# Patient Record
Sex: Female | Born: 1960 | Race: White | Hispanic: No | Smoking: Former smoker
Health system: Southern US, Community
[De-identification: ages and names within clinical notes are randomized; demographics above are authoritative.]

## PROBLEM LIST (undated history)

## (undated) DIAGNOSIS — H409 Unspecified glaucoma: Secondary | ICD-10-CM

## (undated) DIAGNOSIS — M1990 Unspecified osteoarthritis, unspecified site: Secondary | ICD-10-CM

## (undated) DIAGNOSIS — I739 Peripheral vascular disease, unspecified: Secondary | ICD-10-CM

## (undated) DIAGNOSIS — M25511 Pain in right shoulder: Secondary | ICD-10-CM

## (undated) DIAGNOSIS — I10 Essential (primary) hypertension: Secondary | ICD-10-CM

## (undated) DIAGNOSIS — J45909 Unspecified asthma, uncomplicated: Secondary | ICD-10-CM

## (undated) DIAGNOSIS — F419 Anxiety disorder, unspecified: Secondary | ICD-10-CM

## (undated) DIAGNOSIS — E119 Type 2 diabetes mellitus without complications: Secondary | ICD-10-CM

## (undated) DIAGNOSIS — K219 Gastro-esophageal reflux disease without esophagitis: Secondary | ICD-10-CM

## (undated) DIAGNOSIS — N10 Acute tubulo-interstitial nephritis: Secondary | ICD-10-CM

## (undated) DIAGNOSIS — E785 Hyperlipidemia, unspecified: Secondary | ICD-10-CM

## (undated) HISTORY — DX: Acute pyelonephritis: N10

## (undated) HISTORY — DX: Unspecified glaucoma: H40.9

## (undated) HISTORY — DX: Gastro-esophageal reflux disease without esophagitis: K21.9

## (undated) HISTORY — DX: Pain in right shoulder: M25.511

## (undated) HISTORY — DX: Anxiety disorder, unspecified: F41.9

## (undated) HISTORY — PX: ANTERIOR CRUCIATE LIGAMENT REPAIR: SHX115

---

## 2008-11-03 ENCOUNTER — Ambulatory Visit: Payer: Self-pay | Admitting: Anesthesiology

## 2008-12-24 ENCOUNTER — Emergency Department: Payer: Self-pay | Admitting: Emergency Medicine

## 2016-04-23 ENCOUNTER — Emergency Department: Payer: BLUE CROSS/BLUE SHIELD

## 2016-04-23 ENCOUNTER — Encounter: Payer: Self-pay | Admitting: Emergency Medicine

## 2016-04-23 ENCOUNTER — Inpatient Hospital Stay
Admission: EM | Admit: 2016-04-23 | Discharge: 2016-05-02 | DRG: 871 | Disposition: A | Discharge status: Skilled Nursing Facility | Payer: BLUE CROSS/BLUE SHIELD | Attending: Internal Medicine | Admitting: Internal Medicine

## 2016-04-23 DIAGNOSIS — E875 Hyperkalemia: Secondary | ICD-10-CM | POA: Diagnosis present

## 2016-04-23 DIAGNOSIS — N189 Chronic kidney disease, unspecified: Secondary | ICD-10-CM | POA: Diagnosis present

## 2016-04-23 DIAGNOSIS — A4159 Other Gram-negative sepsis: Secondary | ICD-10-CM | POA: Diagnosis not present

## 2016-04-23 DIAGNOSIS — A419 Sepsis, unspecified organism: Secondary | ICD-10-CM

## 2016-04-23 DIAGNOSIS — E860 Dehydration: Secondary | ICD-10-CM | POA: Diagnosis present

## 2016-04-23 DIAGNOSIS — Z7984 Long term (current) use of oral hypoglycemic drugs: Secondary | ICD-10-CM | POA: Diagnosis not present

## 2016-04-23 DIAGNOSIS — N170 Acute kidney failure with tubular necrosis: Secondary | ICD-10-CM | POA: Diagnosis present

## 2016-04-23 DIAGNOSIS — B961 Klebsiella pneumoniae [K. pneumoniae] as the cause of diseases classified elsewhere: Secondary | ICD-10-CM | POA: Diagnosis present

## 2016-04-23 DIAGNOSIS — M6282 Rhabdomyolysis: Secondary | ICD-10-CM | POA: Diagnosis present

## 2016-04-23 DIAGNOSIS — N390 Urinary tract infection, site not specified: Secondary | ICD-10-CM

## 2016-04-23 DIAGNOSIS — E871 Hypo-osmolality and hyponatremia: Secondary | ICD-10-CM | POA: Diagnosis present

## 2016-04-23 DIAGNOSIS — I129 Hypertensive chronic kidney disease with stage 1 through stage 4 chronic kidney disease, or unspecified chronic kidney disease: Secondary | ICD-10-CM | POA: Diagnosis present

## 2016-04-23 DIAGNOSIS — N10 Acute tubulo-interstitial nephritis: Secondary | ICD-10-CM | POA: Diagnosis present

## 2016-04-23 DIAGNOSIS — R29898 Other symptoms and signs involving the musculoskeletal system: Secondary | ICD-10-CM

## 2016-04-23 DIAGNOSIS — E1310 Other specified diabetes mellitus with ketoacidosis without coma: Secondary | ICD-10-CM | POA: Diagnosis present

## 2016-04-23 DIAGNOSIS — N2889 Other specified disorders of kidney and ureter: Secondary | ICD-10-CM | POA: Diagnosis present

## 2016-04-23 DIAGNOSIS — R600 Localized edema: Secondary | ICD-10-CM | POA: Diagnosis present

## 2016-04-23 DIAGNOSIS — N179 Acute kidney failure, unspecified: Secondary | ICD-10-CM

## 2016-04-23 DIAGNOSIS — T796XXD Traumatic ischemia of muscle, subsequent encounter: Secondary | ICD-10-CM | POA: Diagnosis not present

## 2016-04-23 DIAGNOSIS — L8990 Pressure ulcer of unspecified site, unspecified stage: Secondary | ICD-10-CM | POA: Insufficient documentation

## 2016-04-23 DIAGNOSIS — R609 Edema, unspecified: Secondary | ICD-10-CM

## 2016-04-23 DIAGNOSIS — L03116 Cellulitis of left lower limb: Secondary | ICD-10-CM | POA: Diagnosis present

## 2016-04-23 DIAGNOSIS — Z883 Allergy status to other anti-infective agents status: Secondary | ICD-10-CM | POA: Diagnosis not present

## 2016-04-23 DIAGNOSIS — E876 Hypokalemia: Secondary | ICD-10-CM | POA: Diagnosis not present

## 2016-04-23 DIAGNOSIS — R6521 Severe sepsis with septic shock: Secondary | ICD-10-CM | POA: Diagnosis present

## 2016-04-23 DIAGNOSIS — E119 Type 2 diabetes mellitus without complications: Secondary | ICD-10-CM | POA: Insufficient documentation

## 2016-04-23 DIAGNOSIS — G8220 Paraplegia, unspecified: Secondary | ICD-10-CM | POA: Diagnosis present

## 2016-04-23 DIAGNOSIS — N281 Cyst of kidney, acquired: Secondary | ICD-10-CM

## 2016-04-23 DIAGNOSIS — F17290 Nicotine dependence, other tobacco product, uncomplicated: Secondary | ICD-10-CM | POA: Diagnosis present

## 2016-04-23 DIAGNOSIS — E1110 Type 2 diabetes mellitus with ketoacidosis without coma: Secondary | ICD-10-CM | POA: Diagnosis present

## 2016-04-23 DIAGNOSIS — B370 Candidal stomatitis: Secondary | ICD-10-CM | POA: Diagnosis not present

## 2016-04-23 DIAGNOSIS — I471 Supraventricular tachycardia: Secondary | ICD-10-CM | POA: Diagnosis not present

## 2016-04-23 DIAGNOSIS — R509 Fever, unspecified: Secondary | ICD-10-CM

## 2016-04-23 DIAGNOSIS — R0602 Shortness of breath: Secondary | ICD-10-CM

## 2016-04-23 DIAGNOSIS — T796XXS Traumatic ischemia of muscle, sequela: Secondary | ICD-10-CM | POA: Diagnosis not present

## 2016-04-23 HISTORY — DX: Type 2 diabetes mellitus without complications: E11.9

## 2016-04-23 HISTORY — DX: Unspecified osteoarthritis, unspecified site: M19.90

## 2016-04-23 HISTORY — DX: Hyperlipidemia, unspecified: E78.5

## 2016-04-23 HISTORY — DX: Unspecified asthma, uncomplicated: J45.909

## 2016-04-23 HISTORY — DX: Essential (primary) hypertension: I10

## 2016-04-23 LAB — CBC WITH DIFFERENTIAL/PLATELET
BAND NEUTROPHILS: 13 %
BASOS PCT: 0 %
Basophils Absolute: 0 10*3/uL (ref 0–0.1)
Blasts: 0 %
EOS ABS: 0 10*3/uL (ref 0–0.7)
Eosinophils Relative: 0 %
HEMATOCRIT: 32.7 % — AB (ref 35.0–47.0)
Hemoglobin: 10.6 g/dL — ABNORMAL LOW (ref 12.0–16.0)
LYMPHS PCT: 5 %
Lymphs Abs: 0.5 10*3/uL — ABNORMAL LOW (ref 1.0–3.6)
MCH: 27.9 pg (ref 26.0–34.0)
MCHC: 32.4 g/dL (ref 32.0–36.0)
MCV: 86.1 fL (ref 80.0–100.0)
MONO ABS: 0.3 10*3/uL (ref 0.2–0.9)
MONOS PCT: 3 %
Metamyelocytes Relative: 1 %
Myelocytes: 1 %
NEUTROS ABS: 8.8 10*3/uL — AB (ref 1.4–6.5)
NEUTROS PCT: 77 %
NRBC: 0 /100{WBCs}
OTHER: 0 %
Platelets: 203 10*3/uL (ref 150–440)
Promyelocytes Absolute: 0 %
RBC: 3.8 MIL/uL (ref 3.80–5.20)
RDW: 16 % — AB (ref 11.5–14.5)
WBC: 9.6 10*3/uL (ref 3.6–11.0)

## 2016-04-23 LAB — URINALYSIS COMPLETE WITH MICROSCOPIC (ARMC ONLY)
Bilirubin Urine: NEGATIVE
Glucose, UA: >500 mg/dL — AB
Nitrite: NEGATIVE
PROTEIN: 30 mg/dL — AB
Specific Gravity, Urine: 1.016 (ref 1.005–1.030)
pH: 5 (ref 5.0–8.0)

## 2016-04-23 LAB — BASIC METABOLIC PANEL
ANION GAP: 11 (ref 5–15)
BUN: 77 mg/dL — ABNORMAL HIGH (ref 6–20)
CALCIUM: 6.8 mg/dL — AB (ref 8.9–10.3)
CO2: 14 mmol/L — AB (ref 22–32)
Chloride: 100 mmol/L — ABNORMAL LOW (ref 101–111)
Creatinine, Ser: 2.36 mg/dL — ABNORMAL HIGH (ref 0.44–1.00)
GFR, EST AFRICAN AMERICAN: 26 mL/min — AB (ref >60)
GFR, EST NON AFRICAN AMERICAN: 22 mL/min — AB (ref >60)
Glucose, Bld: 486 mg/dL — ABNORMAL HIGH (ref 65–99)
Potassium: 3.6 mmol/L (ref 3.5–5.1)
SODIUM: 125 mmol/L — AB (ref 135–145)

## 2016-04-23 LAB — COMPREHENSIVE METABOLIC PANEL
ALBUMIN: 2.7 g/dL — AB (ref 3.5–5.0)
ALT: 45 U/L (ref 14–54)
ANION GAP: 17 — AB (ref 5–15)
AST: 90 U/L — ABNORMAL HIGH (ref 15–41)
Alkaline Phosphatase: 61 U/L (ref 38–126)
BUN: 99 mg/dL — ABNORMAL HIGH (ref 6–20)
CHLORIDE: 82 mmol/L — AB (ref 101–111)
CO2: 18 mmol/L — AB (ref 22–32)
Calcium: 8.5 mg/dL — ABNORMAL LOW (ref 8.9–10.3)
Creatinine, Ser: 3.1 mg/dL — ABNORMAL HIGH (ref 0.44–1.00)
GFR calc Af Amer: 19 mL/min — ABNORMAL LOW (ref >60)
GFR calc non Af Amer: 16 mL/min — ABNORMAL LOW (ref >60)
GLUCOSE: 788 mg/dL — AB (ref 65–99)
POTASSIUM: 5.2 mmol/L — AB (ref 3.5–5.1)
SODIUM: 117 mmol/L — AB (ref 135–145)
Total Bilirubin: 0.9 mg/dL (ref 0.3–1.2)
Total Protein: 7.4 g/dL (ref 6.5–8.1)

## 2016-04-23 LAB — CBC
HCT: 25.3 % — ABNORMAL LOW (ref 35.0–47.0)
HEMOGLOBIN: 8.4 g/dL — AB (ref 12.0–16.0)
MCH: 27.8 pg (ref 26.0–34.0)
MCHC: 33.1 g/dL (ref 32.0–36.0)
MCV: 84 fL (ref 80.0–100.0)
Platelets: 152 10*3/uL (ref 150–440)
RBC: 3.01 MIL/uL — AB (ref 3.80–5.20)
RDW: 15.9 % — ABNORMAL HIGH (ref 11.5–14.5)
WBC: 6.5 10*3/uL (ref 3.6–11.0)

## 2016-04-23 LAB — LACTIC ACID, PLASMA
LACTIC ACID, VENOUS: 2.8 mmol/L — AB (ref 0.5–2.0)
Lactic Acid, Venous: 2.2 mmol/L (ref 0.5–2.0)

## 2016-04-23 LAB — GLUCOSE, CAPILLARY
GLUCOSE-CAPILLARY: 432 mg/dL — AB (ref 65–99)
GLUCOSE-CAPILLARY: 328 mg/dL — AB (ref 65–99)
GLUCOSE-CAPILLARY: 399 mg/dL — AB (ref 65–99)
Glucose-Capillary: 532 mg/dL — ABNORMAL HIGH (ref 65–99)
Glucose-Capillary: 567 mg/dL (ref 65–99)

## 2016-04-23 LAB — MRSA PCR SCREENING: MRSA BY PCR: POSITIVE — AB

## 2016-04-23 LAB — CK
CK TOTAL: 5665 U/L — AB (ref 38–234)
CK TOTAL: 3670 U/L — AB (ref 38–234)

## 2016-04-23 LAB — TROPONIN I: TROPONIN I: 0.03 ng/mL (ref <0.031)

## 2016-04-23 MED ORDER — ACETAMINOPHEN 650 MG RE SUPP: 650.0000 mg | Freq: Four times a day (QID) | RECTAL | Status: DC | PRN

## 2016-04-23 MED ORDER — POTASSIUM CHLORIDE 10 MEQ/100ML IV SOLN: 10.0000 meq | INTRAVENOUS | Status: DC

## 2016-04-23 MED ORDER — TRAZODONE HCL 50 MG PO TABS
25.0000 mg | ORAL_TABLET | Freq: Every evening | ORAL | Status: DC | PRN
  Administered 2016-04-26 – 2016-05-01 (×4): 25 mg via ORAL
  Filled 2016-04-23 (×4): qty 1

## 2016-04-23 MED ORDER — ACETAMINOPHEN 325 MG PO TABS
650.0000 mg | ORAL_TABLET | Freq: Four times a day (QID) | ORAL | Status: DC | PRN
  Administered 2016-04-24 – 2016-05-01 (×6): 650 mg via ORAL
  Filled 2016-04-23 (×6): qty 2

## 2016-04-23 MED ORDER — DEXTROSE 5 % IV SOLN
1.0000 g | INTRAVENOUS | Status: DC
  Administered 2016-04-23: 1 g via INTRAVENOUS
  Filled 2016-04-23: qty 10

## 2016-04-23 MED ORDER — VANCOMYCIN HCL IN DEXTROSE 1-5 GM/200ML-% IV SOLN
1000.0000 mg | Freq: Once | INTRAVENOUS | Status: AC
  Administered 2016-04-23: 1000 mg via INTRAVENOUS
  Filled 2016-04-23: qty 200

## 2016-04-23 MED ORDER — SODIUM CHLORIDE 0.9 % IV BOLUS (SEPSIS)
1000.0000 mL | Freq: Once | INTRAVENOUS | Status: AC
  Administered 2016-04-23: 1000 mL via INTRAVENOUS

## 2016-04-23 MED ORDER — HYDROCODONE-ACETAMINOPHEN 5-325 MG PO TABS
1.0000 | ORAL_TABLET | ORAL | Status: DC | PRN
  Administered 2016-04-26 – 2016-04-27 (×2): 1 via ORAL
  Administered 2016-04-28 – 2016-05-01 (×9): 2 via ORAL
  Administered 2016-05-01: 21:00:00 1 via ORAL
  Administered 2016-05-01 – 2016-05-02 (×3): 2 via ORAL
  Filled 2016-04-23 (×5): qty 2
  Filled 2016-04-23: qty 1
  Filled 2016-04-23 (×9): qty 2

## 2016-04-23 MED ORDER — DOCUSATE SODIUM 100 MG PO CAPS
100.0000 mg | ORAL_CAPSULE | Freq: Two times a day (BID) | ORAL | Status: DC
  Administered 2016-04-23 – 2016-05-02 (×19): 100 mg via ORAL
  Filled 2016-04-23 (×18): qty 1

## 2016-04-23 MED ORDER — SODIUM CHLORIDE 0.9 % IV SOLN
INTRAVENOUS | Status: DC
  Administered 2016-04-23: 5.4 [IU]/h via INTRAVENOUS
  Administered 2016-04-24: 4.4 [IU]/h via INTRAVENOUS
  Administered 2016-04-24: 1.7 [IU]/h via INTRAVENOUS
  Filled 2016-04-23: qty 2.5

## 2016-04-23 MED ORDER — ONDANSETRON HCL 4 MG PO TABS: 4.0000 mg | ORAL_TABLET | Freq: Four times a day (QID) | ORAL | Status: DC | PRN

## 2016-04-23 MED ORDER — SODIUM CHLORIDE 0.9 % IV SOLN: INTRAVENOUS | Status: DC

## 2016-04-23 MED ORDER — SODIUM CHLORIDE 0.9 % IV BOLUS (SEPSIS): 1000.0000 mL | Freq: Once | INTRAVENOUS | Status: DC

## 2016-04-23 MED ORDER — ONDANSETRON HCL 4 MG/2ML IJ SOLN: 4.0000 mg | Freq: Four times a day (QID) | INTRAMUSCULAR | Status: DC | PRN

## 2016-04-23 MED ORDER — PIPERACILLIN-TAZOBACTAM 3.375 G IVPB
3.3750 g | Freq: Once | INTRAVENOUS | Status: AC
  Administered 2016-04-23: 3.375 g via INTRAVENOUS
  Filled 2016-04-23: qty 50

## 2016-04-23 MED ORDER — SODIUM CHLORIDE 0.9 % IV SOLN
INTRAVENOUS | Status: AC
Start: 2016-04-23 — End: 2016-04-23
  Administered 2016-04-23: 20:00:00 via INTRAVENOUS

## 2016-04-23 MED ORDER — DEXTROSE-NACL 5-0.45 % IV SOLN
INTRAVENOUS | Status: DC
  Administered 2016-04-24: 03:00:00 via INTRAVENOUS

## 2016-04-23 MED ORDER — HEPARIN SODIUM (PORCINE) 5000 UNIT/ML IJ SOLN
5000.0000 [IU] | Freq: Three times a day (TID) | INTRAMUSCULAR | Status: DC
  Administered 2016-04-23 – 2016-04-25 (×5): 5000 [IU] via SUBCUTANEOUS
  Filled 2016-04-23 (×5): qty 1

## 2016-04-23 MED ORDER — BISACODYL 5 MG PO TBEC
5.0000 mg | DELAYED_RELEASE_TABLET | Freq: Every day | ORAL | Status: DC | PRN
  Administered 2016-05-01: 5 mg via ORAL
  Filled 2016-04-23: qty 1

## 2016-04-23 MED ORDER — HYDROCOD POLST-CPM POLST ER 10-8 MG/5ML PO SUER
5.0000 mL | Freq: Two times a day (BID) | ORAL | Status: DC | PRN
  Administered 2016-04-24 (×2): 5 mL via ORAL
  Filled 2016-04-23 (×2): qty 5

## 2016-04-23 MED ORDER — ACETAMINOPHEN 500 MG PO TABS
1000.0000 mg | ORAL_TABLET | Freq: Once | ORAL | Status: AC
  Administered 2016-04-23: 1000 mg via ORAL
  Filled 2016-04-23: qty 2

## 2016-04-23 NOTE — H&P (Signed)
[facility] - [place] [facility]   PATIENT NAME: [name]    MR#:  [id number]  DATE OF BIRTH:  1961-12-15  DATE OF ADMISSION:  04/23/2016  PRIMARY CARE PHYSICIAN: Dortha Kern, MD   REQUESTING/REFERRING PHYSICIAN: Dr. Marshall Cork  CHIEF COMPLAINT: Generalized weakness    Chief Complaint  Patient presents with  . Fall  . Hyperglycemia    HISTORY OF PRESENT ILLNESS:  [name]  is a 55 y.o. female with a known history of diabetes mellitus type 2, hypertension, hyperlipidemia brought in by family for confusion, generalized weakness. The patient was sitting on the toilet and could not get off the toilet due to weakness. No loss of consciousness or no fall. Patient had temperature of 102 Fahrenheit last Thursday associated with nausea, vomiting since then. Patient had no diarrhea. But has decreased urination since Thursday. Also noted to have generalized weakness since this morning. Stopped taking her diabetic medications namely Victoza, metformin, glipizide since Thursday of last week. She also had poor by mouth intake for 4 days. Noted to have DKA with elevated blood sugar than 600, anion gap 17, hyponatremia with sodium 117, lactic acidosis with lactic acid 2.2, rhabdomyolysis with CK elevated up to 5665. And also has some cough but no shortness of breath.  PAST MEDICAL HISTORY:   Past Medical History  Diagnosis Date  . Diabetes mellitus without complication (HCC)   . Hypertension   . Arthritis   . Asthma   . Hyperlipemia     PAST SURGICAL HISTOIRY:   Past Surgical History  Procedure Laterality Date  . Anterior cruciate ligament repair      SOCIAL HISTORY:   Social History  Substance Use Topics  . Smoking status: Current Every Day Smoker  . Smokeless tobacco: Not on file  . Alcohol Use: No    FAMILY HISTORY:  No family history on file.  DRUG ALLERGIES:  No Known Allergies  REVIEW OF SYSTEMS:  CONSTITUTIONAL: Fever, generalized  weakness EYES: No blurred or double vision.  EARS, NOSE, AND THROAT: No tinnitus or ear pain.  RESPIRATORY: Has some cough but no shortness of breath.  CARDIOVASCULAR: No chest pain, orthopnea, edema.  GASTROINTESTINAL: Nausea, vomiting, poor urine output since 4 days.  GENITOURINARY: No dysuria, hematuria.  ENDOCRINE: No polyuria, nocturia,  HEMATOLOGY: No anemia, easy bruising or bleeding SKIN: No rash or lesion. MUSCULOSKELETAL: No joint pain or arthritis.   NEUROLOGIC: No tingling, numbness, weakness.  PSYCHIATRY: No anxiety or depression.   MEDICATIONS AT HOME:   Prior to Admission medications   Not on File      VITAL SIGNS:  Blood pressure 112/67, pulse 126, temperature 100.6 F (38.1 C), resp. rate 29, height 5\' 3"  (1.6 m), weight 114.7 kg (252 lb 13.9 oz), SpO2 99 %.  PHYSICAL EXAMINATION:  GENERAL:  55 y.o.-year-old patient lying in the bed with no acute distress.  EYES: Pupils equal, round, reactive to light and accommodation. No scleral icterus. Extraocular muscles intact.  HEENT: Head atraumatic, normocephalic. Oropharynx and nasopharynx clear.  NECK:  Supple, no jugular venous distention. No thyroid enlargement, no tenderness.  LUNGS: Normal breath sounds bilaterally, no wheezing, rales,rhonchi or crepitation. No use of accessory muscles of respiration.  CARDIOVASCULAR: S1, S2 normal. No murmurs, rubs, or gallops.  ABDOMEN: Soft, nontender, nondistended. Bowel sounds present. No organomegaly or mass.  EXTREMITIES: No pedal edema, cyanosis, or clubbing.  NEUROLOGIC: Cranial nerves II through XII are intact. Muscle strength 5/5 in all extremities. Sensation  intact. Gait not checked.  PSYCHIATRIC: The patient is alert and oriented x 3.  SKIN: No obvious rash, lesion, or ulcer.   LABORATORY PANEL:   CBC  Recent Labs Lab 04/23/16 1641  WBC 9.6  HGB 10.6*  HCT 32.7*  PLT 203    ------------------------------------------------------------------------------------------------------------------  Chemistries   Recent Labs Lab 04/23/16 1641  NA 117*  K 5.2*  CL 82*  CO2 18*  GLUCOSE 788*  BUN 99*  CREATININE 3.10*  CALCIUM 8.5*  AST 90*  ALT 45  ALKPHOS 61  BILITOT 0.9   ------------------------------------------------------------------------------------------------------------------  Cardiac Enzymes  Recent Labs Lab 04/23/16 1641  TROPONINI 0.03   ------------------------------------------------------------------------------------------------------------------  RADIOLOGY:  Dg Chest Portable 1 View  04/23/2016  CLINICAL DATA:  Post fall yesterday. History of diabetes, hypertension, asthma. EXAM: PORTABLE CHEST 1 VIEW COMPARISON:  None. FINDINGS: Heart size is upper normal. Lungs are clear. Lung volumes are normal. No pleural effusion or pneumothorax seen. No osseous fracture or dislocation identified. IMPRESSION: No acute findings. Electronically Signed   By: Bary Richard M.D.   On: 04/23/2016 17:21    EKG:   Orders placed or performed during the hospital encounter of 04/23/16  . ED EKG  . ED EKG   sinus tachycardia with rate upto 126 bpm. IMPRESSION AND PLAN:   1. #1 diabetic ketoacidosis with anion gap of 17, pH 7.5 blood sugar more than 700 with acute renal failure and hyponatremia: Admit to intensive care unit, started on insulin drip, aggressive IV hydration. The patient nothing by mouth ~anion gap is closed. #2 septic shock secondary to UTI: Evidence of lactic acidosis with 2.2 lactic acid, hypotension blood pressure is 90/70, tachycardia with heart rate 1 30 bpm, acute renal failure with creatinine 3.10. Started on IV Rocephin, aggressive IV hydration, follow blood cultures, urine cultures. #3. rhabdomyolysis with elevated CK 5665: Continue IV aggressive hydration, Avoid nephrotoxic agents with hct Z, lisinopril, daily CK to be  followed. 4.h/o tobacco abuse;still smokes 10 cigars a day,does not want to quit,counselled against smoking for 15   Min.   All the records are reviewed and case discussed with ED provider. Management plans discussed with the patient, family and they are in agreement.  CODE STATUS: full  TOTAL TIME TAKING CARE OF THIS PATIENT: .  Critical care   Merilynn Haydu M.D on 04/23/2016 at 6:38 PM  Between 7am to 6pm - Pager - [phone]  After 6pm go to [email or web address] - password [email or web address] [facility]  [facility] [place] [facility]  Office  [phone]  CC: Primary care physician; Dortha Kern, MD  Note: This dictation was prepared with Dragon dictation along with smaller phrase technology. Any transcriptional errors that result from this process are unintentional.

## 2016-04-23 NOTE — ED Provider Notes (Signed)
[facility] Emergency Department Provider Note  ____________________________________________  Time seen: Approximately 4:34 PM  I have reviewed the triage vital signs and the nursing notes.   HISTORY  Chief Complaint Fall and Hyperglycemia  Caveat-history of physical and review of systems Limited due to the patient's confusion.  HPI [name] is a 55 y.o. female should've diabetes, hypertension, hyperlipidemia who presents via EMS for fall and generalized weakness today. Patient is confused as to what exactly happened today however she reports that she was sitting on the toilet and she could not get off of the toilet because she felt so weak. She fell. She did not hit her head or lose consciousness. She is not complaining of any pain. She did not hit her head or lose consciousness. She thinks she she may have been on the floor the bathroom "for a few days" but then  She also thinks that maybe this happened today.   Past Medical History  Diagnosis Date  . Diabetes mellitus without complication (HCC)   . Hypertension   . Arthritis   . Asthma   . Hyperlipemia     There are no active problems to display for this patient.   Past Surgical History  Procedure Laterality Date  . Anterior cruciate ligament repair      No current outpatient prescriptions on file.  Allergies Review of patient's allergies indicates no known allergies.  No family history on file.  Social History Social History  Substance Use Topics  . Smoking status: Current Every Day Smoker  . Smokeless tobacco: None  . Alcohol Use: No    Review of Systems   Caveat-history of physical and review of systems Limited due to the patient's confusion. ____________________________________________   PHYSICAL EXAM:  Filed Vitals:   04/23/16 1700 04/23/16 1715 04/23/16 1730 04/23/16 1817  BP: 106/63 113/83 112/67   Pulse:  96 126   Temp: 100.6 F (38.1 C) 100.6 F (38.1 C) 100.6 F  (38.1 C)   Resp: 32 22 29   Height:      Weight:    252 lb 13.9 oz (114.7 kg)  SpO2:  99% 99%      Constitutional: Alert and oriented x 3 but appears intermittently confused and she is unable to give a coherent history.  Eyes: Conjunctivae are normal. PERRL. EOMI. Head: Atraumatic. Nose: No congestion/rhinnorhea. Mouth/Throat: Mucous membranes are dry.  Oropharynx non-erythematous. Neck: No stridor.  Supple without meningismus. No midline C-spine tenderness to palpation. Cardiovascular: Tachycardic rate, regular rhythm. Grossly normal heart sounds.  Good peripheral circulation. Respiratory: Tachypnea without increased work of breathing.  No retractions. Lungs CTAB. Gastrointestinal: Soft and nontender. No distention. No abdominal bruits. No CVA tenderness. Genitourinary: deferred Musculoskeletal: No lower extremity tenderness nor edema.  No joint effusions. Neurologic:  Normal speech and language. No gross focal neurologic deficits are appreciated.  Skin:  Skin is warm, dry and intact. No rash noted. Psychiatric: Mood and affect are normal. Speech and behavior are normal.  ____________________________________________   LABS (all labs ordered are listed, but only abnormal results are displayed)  Labs Reviewed  GLUCOSE, CAPILLARY - Abnormal; Notable for the following:    Glucose-Capillary >600 (*)    All other components within normal limits  BLOOD GAS, VENOUS - Abnormal; Notable for the following:    pH, Ven 7.25 (*)    pCO2, Ven 38 (*)    pO2, Ven <31.0 (*)    Bicarbonate 16.7 (*)    Acid-base deficit 9.8 (*)  All other components within normal limits  COMPREHENSIVE METABOLIC PANEL - Abnormal; Notable for the following:    Sodium 117 (*)    Potassium 5.2 (*)    Chloride 82 (*)    CO2 18 (*)    Glucose, Bld 788 (*)    BUN 99 (*)    Creatinine, Ser 3.10 (*)    Calcium 8.5 (*)    Albumin 2.7 (*)    AST 90 (*)    GFR calc non Af Amer 16 (*)    GFR calc Af Amer 19 (*)     Anion gap 17 (*)    All other components within normal limits  CBC WITH DIFFERENTIAL/PLATELET - Abnormal; Notable for the following:    Hemoglobin 10.6 (*)    HCT 32.7 (*)    RDW 16.0 (*)    Neutro Abs 8.8 (*)    Lymphs Abs 0.5 (*)    All other components within normal limits  URINALYSIS COMPLETEWITH MICROSCOPIC ([facility] ONLY) - Abnormal; Notable for the following:    Color, Urine YELLOW (*)    APPearance CLOUDY (*)    Glucose, UA >500 (*)    Ketones, ur TRACE (*)    Hgb urine dipstick 3+ (*)    Protein, ur 30 (*)    Leukocytes, UA 2+ (*)    Bacteria, UA MANY (*)    Squamous Epithelial / LPF 0-5 (*)    All other components within normal limits  LACTIC ACID, PLASMA - Abnormal; Notable for the following:    Lactic Acid, Venous 2.2 (*)    All other components within normal limits  CK - Abnormal; Notable for the following:    Total CK 5665 (*)    All other components within normal limits  CULTURE, BLOOD (ROUTINE X 2)  CULTURE, BLOOD (ROUTINE X 2)  URINE CULTURE  TROPONIN I  LACTIC ACID, PLASMA   ____________________________________________  EKG  ED ECG REPORT I, Gayla Doss, the attending physician, personally viewed and interpreted this ECG.   Date: 04/23/2016  EKG Time: 16:33  Rate: 126  Rhythm: sinus tachycardia  Axis: normal  Intervals:none  ST&T Change: No acute ST elevation. Q waves in the anteroseptal leads.  ____________________________________________  RADIOLOGY  CXR  IMPRESSION: No acute findings.  ____________________________________________   PROCEDURES  Procedure(s) performed: None  Critical Care performed: Yes, see critical care note(s). Total critical care time spent 45 minutes.  ____________________________________________   INITIAL IMPRESSION / ASSESSMENT AND PLAN / ED COURSE  Pertinent labs & imaging results that were available during my care of the patient were reviewed by me and considered in my medical decision making (see  chart for details).  [name] is a 55 y.o. female should've diabetes, hypertension, hyperlipidemia who presents via EMS for fall and generalized weakness today. On arrival to the emergency department, she is tachycardic and tachypneic meeting 2 out of 4 Sirs criteria. Code sepsis initiated, we'll give IV fluids, vancomycin, Zosyn. Her glucose was elevated at 788 and her labs are consistent with diabetic acidosis. PH is 1.25, anion gap 17, potassium 5.2, no acute hyperkalemic EKG changes, insulin drip ordered. Creatinine is elevated at 3.10, CK also elevated close to 6000, continue liberal IV fluids or treatment of acute rhabdomyolysis. CBC with mild anemia. Chest x-ray clear. Urinalysis concerning for UTI. Suspect UTI sepsis with new rhabdo mild lysis as well as diabetic ketoacidosis. Case discussed with the hospitalist, Dr. Luberta Mutter for admission at 6:20 PM. She has received 3 L  of normal saline in the emergency department, will not give full 30 mg/kg bolus of normal saline due to risk of precipitating flash pulmonary edema. Additionally, her mentation is improving and she is maintaining adequate blood pressure. ____________________________________________   FINAL CLINICAL IMPRESSION(S) / ED DIAGNOSES  Final diagnoses:  Diabetic ketoacidosis without coma associated with other specified diabetes mellitus (HCC)  Sepsis secondary to UTI [facility])  Non-traumatic rhabdomyolysis      Gayla Doss, MD 04/23/16 Rickey Primus

## 2016-04-23 NOTE — ED Notes (Signed)
Dr. Inocencio Homes informed of critical results of Glucose 788 and Sodium 117

## 2016-04-23 NOTE — ED Notes (Signed)
Pharmacy tech at bedside verifying home medications

## 2016-04-23 NOTE — ED Notes (Signed)
Dr. Inocencio Homes informed of Lactic Acid of 2.2

## 2016-04-23 NOTE — ED Notes (Signed)
Report Given to [place], [place]

## 2016-04-23 NOTE — ED Notes (Signed)
Patient brought in by [facility] from home for fall that occurred yesterday. Patient was using the bathroom when she fell, patient states that she was unable to get herself out of the floor. Patient denies hitting her head and denies any pain at this time. Per EMS patients blood sugar reading over 500 on their meter.

## 2016-04-23 NOTE — ED Notes (Signed)
IV Attempted x2 to replace patient dc'd catheter. Patient made repeatedly rude comments to this RN

## 2016-04-24 ENCOUNTER — Inpatient Hospital Stay: Payer: BLUE CROSS/BLUE SHIELD

## 2016-04-24 DIAGNOSIS — L8990 Pressure ulcer of unspecified site, unspecified stage: Secondary | ICD-10-CM | POA: Insufficient documentation

## 2016-04-24 LAB — HEMOGLOBIN A1C
HEMOGLOBIN A1C: 11.1 % — AB (ref 4.0–6.0)
Hgb A1c MFr Bld: 10.6 % — ABNORMAL HIGH (ref 4.0–6.0)

## 2016-04-24 LAB — CK: Total CK: 2934 U/L — ABNORMAL HIGH (ref 38–234)

## 2016-04-24 LAB — BLOOD CULTURE ID PANEL (REFLEXED)
Acinetobacter baumannii: NOT DETECTED
CANDIDA GLABRATA: NOT DETECTED
CANDIDA KRUSEI: NOT DETECTED
CANDIDA TROPICALIS: NOT DETECTED
Candida albicans: NOT DETECTED
Candida parapsilosis: NOT DETECTED
Carbapenem resistance: NOT DETECTED
ESCHERICHIA COLI: NOT DETECTED
Enterobacter cloacae complex: NOT DETECTED
Enterobacteriaceae species: DETECTED — AB
Enterococcus species: NOT DETECTED
Haemophilus influenzae: NOT DETECTED
KLEBSIELLA OXYTOCA: NOT DETECTED
Klebsiella pneumoniae: DETECTED — AB
LISTERIA MONOCYTOGENES: NOT DETECTED
Methicillin resistance: NOT DETECTED
Neisseria meningitidis: NOT DETECTED
PROTEUS SPECIES: NOT DETECTED
Pseudomonas aeruginosa: NOT DETECTED
SERRATIA MARCESCENS: NOT DETECTED
STREPTOCOCCUS SPECIES: NOT DETECTED
Staphylococcus aureus (BCID): NOT DETECTED
Staphylococcus species: NOT DETECTED
Streptococcus agalactiae: NOT DETECTED
Streptococcus pneumoniae: NOT DETECTED
Streptococcus pyogenes: NOT DETECTED
Vancomycin resistance: NOT DETECTED

## 2016-04-24 LAB — BASIC METABOLIC PANEL
ANION GAP: 10 (ref 5–15)
ANION GAP: 13 (ref 5–15)
Anion gap: 11 (ref 5–15)
Anion gap: 13 (ref 5–15)
Anion gap: 12 (ref 5–15)
Anion gap: 13 (ref 5–15)
BUN: 71 mg/dL — ABNORMAL HIGH (ref 6–20)
BUN: 80 mg/dL — ABNORMAL HIGH (ref 6–20)
BUN: 82 mg/dL — ABNORMAL HIGH (ref 6–20)
BUN: 85 mg/dL — ABNORMAL HIGH (ref 6–20)
BUN: 85 mg/dL — ABNORMAL HIGH (ref 6–20)
BUN: 88 mg/dL — ABNORMAL HIGH (ref 6–20)
CHLORIDE: 102 mmol/L (ref 101–111)
CO2: 17 mmol/L — AB (ref 22–32)
CO2: 17 mmol/L — ABNORMAL LOW (ref 22–32)
CO2: 17 mmol/L — ABNORMAL LOW (ref 22–32)
CO2: 18 mmol/L — ABNORMAL LOW (ref 22–32)
CO2: 21 mmol/L — ABNORMAL LOW (ref 22–32)
CO2: 23 mmol/L (ref 22–32)
Calcium: 7.6 mg/dL — ABNORMAL LOW (ref 8.9–10.3)
Calcium: 7.6 mg/dL — ABNORMAL LOW (ref 8.9–10.3)
Calcium: 7.9 mg/dL — ABNORMAL LOW (ref 8.9–10.3)
Calcium: 8 mg/dL — ABNORMAL LOW (ref 8.9–10.3)
Calcium: 8 mg/dL — ABNORMAL LOW (ref 8.9–10.3)
Calcium: 8.3 mg/dL — ABNORMAL LOW (ref 8.9–10.3)
Chloride: 101 mmol/L (ref 101–111)
Chloride: 101 mmol/L (ref 101–111)
Chloride: 95 mmol/L — ABNORMAL LOW (ref 101–111)
Chloride: 97 mmol/L — ABNORMAL LOW (ref 101–111)
Chloride: 99 mmol/L — ABNORMAL LOW (ref 101–111)
Creatinine, Ser: 1.83 mg/dL — ABNORMAL HIGH (ref 0.44–1.00)
Creatinine, Ser: 2.18 mg/dL — ABNORMAL HIGH (ref 0.44–1.00)
Creatinine, Ser: 2.34 mg/dL — ABNORMAL HIGH (ref 0.44–1.00)
Creatinine, Ser: 2.42 mg/dL — ABNORMAL HIGH (ref 0.44–1.00)
Creatinine, Ser: 2.43 mg/dL — ABNORMAL HIGH (ref 0.44–1.00)
Creatinine, Ser: 2.53 mg/dL — ABNORMAL HIGH (ref 0.44–1.00)
GFR calc Af Amer: 25 mL/min — ABNORMAL LOW (ref >60)
GFR calc Af Amer: 25 mL/min — ABNORMAL LOW (ref >60)
GFR calc Af Amer: 26 mL/min — ABNORMAL LOW (ref >60)
GFR calc Af Amer: 28 mL/min — ABNORMAL LOW (ref >60)
GFR calc Af Amer: 35 mL/min — ABNORMAL LOW (ref >60)
GFR calc non Af Amer: 20 mL/min — ABNORMAL LOW (ref >60)
GFR calc non Af Amer: 22 mL/min — ABNORMAL LOW (ref >60)
GFR calc non Af Amer: 22 mL/min — ABNORMAL LOW (ref >60)
GFR calc non Af Amer: 24 mL/min — ABNORMAL LOW (ref >60)
GFR calc non Af Amer: 30 mL/min — ABNORMAL LOW (ref >60)
GFR, EST AFRICAN AMERICAN: 24 mL/min — AB (ref >60)
GFR, EST NON AFRICAN AMERICAN: 21 mL/min — AB (ref >60)
GLUCOSE: 283 mg/dL — AB (ref 65–99)
Glucose, Bld: 118 mg/dL — ABNORMAL HIGH (ref 65–99)
Glucose, Bld: 144 mg/dL — ABNORMAL HIGH (ref 65–99)
Glucose, Bld: 177 mg/dL — ABNORMAL HIGH (ref 65–99)
Glucose, Bld: 217 mg/dL — ABNORMAL HIGH (ref 65–99)
Glucose, Bld: 131 mg/dL — ABNORMAL HIGH (ref 65–99)
POTASSIUM: 3.6 mmol/L (ref 3.5–5.1)
POTASSIUM: 4.1 mmol/L (ref 3.5–5.1)
Potassium: 3.6 mmol/L (ref 3.5–5.1)
Potassium: 3.9 mmol/L (ref 3.5–5.1)
Potassium: 3.6 mmol/L (ref 3.5–5.1)
Potassium: 3 mmol/L — ABNORMAL LOW (ref 3.5–5.1)
SODIUM: 129 mmol/L — AB (ref 135–145)
Sodium: 129 mmol/L — ABNORMAL LOW (ref 135–145)
Sodium: 130 mmol/L — ABNORMAL LOW (ref 135–145)
Sodium: 130 mmol/L — ABNORMAL LOW (ref 135–145)
Sodium: 131 mmol/L — ABNORMAL LOW (ref 135–145)
Sodium: 131 mmol/L — ABNORMAL LOW (ref 135–145)

## 2016-04-24 LAB — CBC
HEMATOCRIT: 30 % — AB (ref 35.0–47.0)
HEMOGLOBIN: 10.1 g/dL — AB (ref 12.0–16.0)
MCH: 27.8 pg (ref 26.0–34.0)
MCHC: 33.6 g/dL (ref 32.0–36.0)
MCV: 82.9 fL (ref 80.0–100.0)
PLATELETS: 168 10*3/uL (ref 150–440)
RBC: 3.63 MIL/uL — AB (ref 3.80–5.20)
RDW: 15.8 % — ABNORMAL HIGH (ref 11.5–14.5)
WBC: 5.3 10*3/uL (ref 3.6–11.0)

## 2016-04-24 LAB — GLUCOSE, CAPILLARY
GLUCOSE-CAPILLARY: 122 mg/dL — AB (ref 65–99)
GLUCOSE-CAPILLARY: 138 mg/dL — AB (ref 65–99)
GLUCOSE-CAPILLARY: 149 mg/dL — AB (ref 65–99)
GLUCOSE-CAPILLARY: 159 mg/dL — AB (ref 65–99)
GLUCOSE-CAPILLARY: 163 mg/dL — AB (ref 65–99)
GLUCOSE-CAPILLARY: 167 mg/dL — AB (ref 65–99)
GLUCOSE-CAPILLARY: 194 mg/dL — AB (ref 65–99)
GLUCOSE-CAPILLARY: 205 mg/dL — AB (ref 65–99)
GLUCOSE-CAPILLARY: 214 mg/dL — AB (ref 65–99)
GLUCOSE-CAPILLARY: 237 mg/dL — AB (ref 65–99)
GLUCOSE-CAPILLARY: 255 mg/dL — AB (ref 65–99)
Glucose-Capillary: 121 mg/dL — ABNORMAL HIGH (ref 65–99)
Glucose-Capillary: 140 mg/dL — ABNORMAL HIGH (ref 65–99)
Glucose-Capillary: 142 mg/dL — ABNORMAL HIGH (ref 65–99)
Glucose-Capillary: 146 mg/dL — ABNORMAL HIGH (ref 65–99)
Glucose-Capillary: 151 mg/dL — ABNORMAL HIGH (ref 65–99)
Glucose-Capillary: 152 mg/dL — ABNORMAL HIGH (ref 65–99)
Glucose-Capillary: 173 mg/dL — ABNORMAL HIGH (ref 65–99)
Glucose-Capillary: 178 mg/dL — ABNORMAL HIGH (ref 65–99)
Glucose-Capillary: 179 mg/dL — ABNORMAL HIGH (ref 65–99)
Glucose-Capillary: 200 mg/dL — ABNORMAL HIGH (ref 65–99)

## 2016-04-24 MED ORDER — SODIUM ACETATE 2 MEQ/ML IV SOLN
INTRAVENOUS | Status: DC
  Administered 2016-04-24 (×3): via INTRAVENOUS
  Filled 2016-04-24 (×5): qty 1000

## 2016-04-24 MED ORDER — SODIUM BICARBONATE 8.4 % IV SOLN: INTRAVENOUS | Status: DC

## 2016-04-24 MED ORDER — NOREPINEPHRINE BITARTRATE 1 MG/ML IV SOLN: 4.0000 ug/min | INTRAVENOUS | Status: DC

## 2016-04-24 MED ORDER — MUPIROCIN 2 % EX OINT
1.0000 "application " | TOPICAL_OINTMENT | Freq: Two times a day (BID) | CUTANEOUS | Status: AC
  Administered 2016-04-24 – 2016-04-28 (×9): 1 via NASAL
  Filled 2016-04-24: qty 22

## 2016-04-24 MED ORDER — NICOTINE 21 MG/24HR TD PT24
21.0000 mg | MEDICATED_PATCH | Freq: Every day | TRANSDERMAL | Status: DC
  Administered 2016-04-24 – 2016-04-29 (×6): 21 mg via TRANSDERMAL
  Filled 2016-04-24 (×8): qty 1

## 2016-04-24 MED ORDER — CHLORHEXIDINE GLUCONATE CLOTH 2 % EX PADS
6.0000 | MEDICATED_PAD | Freq: Every day | CUTANEOUS | Status: AC
  Administered 2016-04-24 – 2016-04-28 (×5): 6 via TOPICAL

## 2016-04-24 MED ORDER — DEXTROSE 5 % IV SOLN
2.0000 g | INTRAVENOUS | Status: DC
  Administered 2016-04-24 – 2016-04-27 (×4): 2 g via INTRAVENOUS
  Filled 2016-04-24 (×4): qty 2

## 2016-04-24 MED ORDER — NOREPINEPHRINE 4 MG/250ML-% IV SOLN: 0.0000 ug/min | INTRAVENOUS | Status: DC

## 2016-04-24 MED ORDER — SODIUM CHLORIDE 0.9 % IV BOLUS (SEPSIS)
1000.0000 mL | Freq: Once | INTRAVENOUS | Status: AC
  Administered 2016-04-24: 1000 mL via INTRAVENOUS

## 2016-04-24 MED ORDER — IBUPROFEN 400 MG PO TABS
400.0000 mg | ORAL_TABLET | Freq: Once | ORAL | Status: AC
  Administered 2016-04-24: 400 mg via ORAL
  Filled 2016-04-24: qty 1

## 2016-04-24 MED ORDER — INSULIN GLARGINE 100 UNIT/ML ~~LOC~~ SOLN
10.0000 [IU] | SUBCUTANEOUS | Status: DC
  Administered 2016-04-24: 10 [IU] via SUBCUTANEOUS
  Filled 2016-04-24 (×2): qty 0.1

## 2016-04-24 MED ORDER — INSULIN ASPART 100 UNIT/ML ~~LOC~~ SOLN
0.0000 [IU] | Freq: Three times a day (TID) | SUBCUTANEOUS | Status: DC
  Administered 2016-04-25: 7 [IU] via SUBCUTANEOUS
  Administered 2016-04-25: 5 [IU] via SUBCUTANEOUS
  Filled 2016-04-24: qty 7
  Filled 2016-04-24: qty 5

## 2016-04-24 MED ORDER — NYSTATIN 100000 UNIT/ML MT SUSP
5.0000 mL | Freq: Four times a day (QID) | OROMUCOSAL | Status: DC
  Administered 2016-04-24 – 2016-04-30 (×25): 500000 [IU] via ORAL
  Filled 2016-04-24 (×28): qty 5

## 2016-04-24 NOTE — Consult Note (Signed)
Date: 04/24/2016                  Patient Name:  [name]  MRN: [id number]  DOB: 25-May-1961  Age / Sex: 55 y.o., female         PCP: Dortha Kern, MD                 Service Requesting Consult: Internal medicine                 Reason for Consult: ARF            History of Present Illness: Patient is a 55 y.o. female with medical problems of Type 2 diabetes, hypertension, hyperlipidemia, who was admitted to [facility] on 04/23/2016 for evaluation of confusion and generalized weakness. Patient and her mother state that she has been sick for about a week. She was getting outpatient treatment for pneumonia with clarithromycin without improvement. She reports poor appetite. This past Wednesday, he had a fever. Thursday and Friday she skipped work. She states that she lay on the floor for 2 days and was not able to get to the home to call family. Her coworkers called EMS and they found her on the floor.  Upon admission, she was noted to have a low sodium of 117, hyperkalemia of 5.2, acute renal failure with creatinine of 3., Blood glucose of 788. Patient was diagnosed with DKA and admitted for further evaluation.  Hospital course has been complicated with blood cultures that are positive for Klebsiella, urine cultures are positive for gram-negative rods. Appetite has been poor but today she wants to try food. No nausea or vomiting. Fevers are improving. Blood pressure has been low and she is to be placed on the Levophed today Treatment regimen includes Rocephin, IV sodium acetate, IV insulin drip, intravenous vancomycin, IV Zosyn   Medications: Outpatient medications: Prescriptions prior to admission  Medication Sig Dispense Refill Last Dose  . ALPRAZolam (XANAX) 0.5 MG tablet Take 0.5 mg by mouth 2 (two) times daily as needed for anxiety or sleep.   Past Month at Unknown time  . atorvastatin (LIPITOR) 20 MG tablet Take 20 mg by mouth at bedtime.  12 04/19/2016  . Cholecalciferol (VITAMIN  D3) 5000 units CAPS Take 5,000 Units by mouth daily.   04/20/2016  . fenofibrate 160 MG tablet Take 160 mg by mouth every evening.  3 04/19/2016  . hydrochlorothiazide (HYDRODIURIL) 25 MG tablet Take 25 mg by mouth daily.  3 04/20/2016  . ipratropium-albuterol (DUONEB) 0.5-2.5 (3) MG/3ML SOLN Take 3 mLs by nebulization 4 (four) times daily as needed. For wheezing.  2 Past Month at Unknown time  . JANUVIA 100 MG tablet Take 100 mg by mouth daily.  11 04/20/2016  . latanoprost (XALATAN) 0.005 % ophthalmic solution Place 1 drop into both eyes at bedtime.  4 04/19/2016  . lisinopril-hydrochlorothiazide (PRINZIDE,ZESTORETIC) 10-12.5 MG tablet Take 1 tablet by mouth daily.  3 04/20/2016  . metFORMIN (GLUCOPHAGE-XR) 500 MG 24 hr tablet Take 1,000 mg by mouth 2 (two) times daily.  3 04/20/2016  . oxybutynin (DITROPAN-XL) 10 MG 24 hr tablet Take 10 mg by mouth at bedtime.  3 04/19/2016  . pioglitazone (ACTOS) 45 MG tablet Take 45 mg by mouth daily.  3 04/20/2016  . ranitidine (ZANTAC) 300 MG capsule Take 300 mg by mouth daily.  3 04/20/2016  . VICTOZA 18 MG/3ML SOPN Inject 1.8 mg into the skin daily.  12 04/20/2016  . zolpidem (AMBIEN)  5 MG tablet Take 5 mg by mouth at bedtime as needed for sleep.   unknown    Current medications: Current Facility-Administered Medications  Medication Dose Route Frequency Provider Last Rate Last Dose  . acetaminophen (TYLENOL) tablet 650 mg  650 mg Oral Q6H PRN Katha Hamming, MD   650 mg at 04/24/16 0014   Or  . acetaminophen (TYLENOL) suppository 650 mg  650 mg Rectal Q6H PRN Katha Hamming, MD      . bisacodyl (DULCOLAX) EC tablet 5 mg  5 mg Oral Daily PRN Katha Hamming, MD      . cefTRIAXone (ROCEPHIN) 2 g in dextrose 5 % 50 mL IVPB  2 g Intravenous Q24H Katharina Caper, MD   2 g at 04/24/16 0656  . Chlorhexidine Gluconate Cloth 2 % PADS 6 each  6 each Topical Q0600 Katha Hamming, MD   6 each at 04/24/16 0620  . chlorpheniramine-HYDROcodone (TUSSIONEX)  10-8 MG/5ML suspension 5 mL  5 mL Oral Q12H PRN Arnaldo Natal, MD   5 mL at 04/24/16 0236  . dextrose 5 % 1,000 mL with sodium acetate 150 mEq infusion   Intravenous Continuous Katharina Caper, MD 200 mL/hr at 04/24/16 1123    . dextrose 5 %-0.45 % sodium chloride infusion   Intravenous Continuous Katha Hamming, MD 100 mL/hr at 04/24/16 0700    . docusate sodium (COLACE) capsule 100 mg  100 mg Oral BID Katha Hamming, MD   100 mg at 04/24/16 1003  . heparin injection 5,000 Units  5,000 Units Subcutaneous Q8H Katha Hamming, MD   5,000 Units at 04/24/16 [phone]  . HYDROcodone-acetaminophen (NORCO/VICODIN) 5-325 MG per tablet 1-2 tablet  1-2 tablet Oral Q4H PRN Katha Hamming, MD      . insulin regular (NOVOLIN R,HUMULIN R) 250 Units in sodium chloride 0.9 % 250 mL (1 Units/mL) infusion   Intravenous Continuous Gayla Doss, MD   Stopped at 04/24/16 1123  . mupirocin ointment (BACTROBAN) 2 % 1 application  1 application Nasal BID Katha Hamming, MD   1 application at 04/24/16 1021  . nicotine (NICODERM CQ - dosed in mg/24 hours) patch 21 mg  21 mg Transdermal Daily Arnaldo Natal, MD   21 mg at 04/24/16 1003  . norepinephrine (LEVOPHED) 4mg  in D5W premix infusion  0-40 mcg/min Intravenous Titrated , RPH   Stopped at 04/24/16 1103  . nystatin (MYCOSTATIN) 100000 UNIT/ML suspension 500,000 Units  5 mL Oral QID 04/26/16, MD   500,000 Units at 04/24/16 1123  . ondansetron (ZOFRAN) tablet 4 mg  4 mg Oral Q6H PRN 04/26/16, MD       Or  . ondansetron (ZOFRAN) injection 4 mg  4 mg Intravenous Q6H PRN Katha Hamming, MD      . sodium chloride 0.9 % bolus 1,000 mL  1,000 mL Intravenous Once Katha Hamming, MD   1,000 mL at 04/24/16 1020  . traZODone (DESYREL) tablet 25 mg  25 mg Oral QHS PRN 04/26/16, MD          Allergies: Allergies  Allergen Reactions  . Biaxin [Clarithromycin] Rash    Patient states this medication  gives her severe rash and thrush in the mouth.      Past Medical History: Past Medical History  Diagnosis Date  . Diabetes mellitus without complication (HCC)   . Hypertension   . Arthritis   . Asthma   . Hyperlipemia      Past Surgical History: Past Surgical  History  Procedure Laterality Date  . Anterior cruciate ligament repair       Family History: No family history on file.   Social History: Social History   Social History  . Marital Status: Divorced    Spouse Name: N/A  . Number of Children: N/A  . Years of Education: N/A   Occupational History  . Not on file.   Social History Main Topics  . Smoking status: Current Every Day Smoker  . Smokeless tobacco: Not on file  . Alcohol Use: No  . Drug Use: Not on file  . Sexual Activity: Not on file   Other Topics Concern  . Not on file   Social History Narrative  . No narrative on file     Review of Systems: Gen: Fevers, chills, generalized weakness HEENT: Mouth is dry, no vision problems CV: No chest pain today but did feel discomfort prior to admission Resp: Reports cough, difficulty to bring up sputum GI: Poor appetite, nausea, no vomiting, no blood in the stool GU : No hematuria, no history of kidney stones MS: No acute complaints Derm:  No acute complaints Psych: No acute complaints reported Heme: No acute complaints Neuro: No acute complaints Endocrine. Diabetes is generally well controlled. Has been difficult to control for the past 2 weeks  Vital Signs: Blood pressure 83/55, pulse 102, temperature 98.6 F (37 C), temperature source Other (Comment), resp. rate 24, height 5\' 5"  (1.651 m), weight 117.7 kg (259 lb 7.7 oz), SpO2 97 %.   Intake/Output Summary (Last 24 hours) at 04/24/16 1129 Last data filed at 04/24/16 0700  Gross per 24 hour  Intake 3230.16 ml  Output   1850 ml  Net 1380.16 ml    Weight trends: Filed Weights   04/23/16 1817 04/23/16 2011 04/24/16 0500  Weight: 114.7  kg (252 lb 13.9 oz) 117.7 kg (259 lb 7.7 oz) 117.7 kg (259 lb 7.7 oz)    Physical Exam: General:  Obese female, laying in the bed   HEENT Anicteric, moist oral mucous membranes   Neck:  Supple, no masses   Lungs: Normal breathing effort, no crackles   Heart::  Regular, no rub or gallop   Abdomen: Soft, nondistended, nontender   Extremities:  Trace peripheral edema   Neurologic: Alert, oriented, able to answer questions   Skin: No acute rashes              Lab results: Basic Metabolic Panel:  Recent Labs Lab 04/24/16 0011 04/24/16 0439 04/24/16 0842  NA 129* 129* 130*  K 4.1 3.6 3.6  CL 99* 102 101  CO2 17* 17* 18*  GLUCOSE 283* 118* 144*  BUN 85* 88* 82*  CREATININE 2.43* 2.53* 2.42*  CALCIUM 8.3* 8.0* 8.0*    Liver Function Tests:  Recent Labs Lab 04/23/16 1641  AST 90*  ALT 45  ALKPHOS 61  BILITOT 0.9  PROT 7.4  ALBUMIN 2.7*   No results for input(s): LIPASE, AMYLASE in the last 168 hours. No results for input(s): AMMONIA in the last 168 hours.  CBC:  Recent Labs Lab 04/23/16 1641 04/23/16 2020 04/24/16 0439  WBC 9.6 6.5 5.3  NEUTROABS 8.8*  --   --   HGB 10.6* 8.4* 10.1*  HCT 32.7* 25.3* 30.0*  MCV 86.1 84.0 82.9  PLT 203 152 168    Cardiac Enzymes:  Recent Labs Lab 04/23/16 1641  04/24/16 0730  CKTOTAL  --   < > 2934*  TROPONINI 0.03  --   --   < > =  values in this interval not displayed.  BNP: Invalid input(s): POCBNP  CBG:  Recent Labs Lab 04/24/16 0639 04/24/16 0709 04/24/16 0849 04/24/16 1000 04/24/16 1105  GLUCAP 159* 167* 121* 142* 138*    Microbiology: Recent Results (from the past 720 hour(s))  Blood culture (routine x 2)     Status: Abnormal (Preliminary result)   Collection Time: 04/23/16  4:35 PM  Result Value Ref Range Status   Specimen Description BLOOD RIGHT ANTECUBITAL  Final   Special Requests   Final    BOTTLES DRAWN AEROBIC AND ANAEROBIC  AER 7CC ANA 1CC   Culture  Setup Time   Final    GRAM  NEGATIVE RODS IN BOTH AEROBIC AND ANAEROBIC BOTTLES CRITICAL RESULT CALLED TO, READ BACK BY AND VERIFIED WITH: Nate Cookson @ [phone] 04/24/16 by [facility]    Culture (A)  Final    KLEBSIELLA PNEUMONIAE IN BOTH AEROBIC AND ANAEROBIC BOTTLES SUSCEPTIBILITIES TO FOLLOW ONCE BETTER GROWTH    Report Status PENDING  Incomplete  Blood Culture ID Panel (Reflexed)     Status: Abnormal   Collection Time: 04/23/16  4:35 PM  Result Value Ref Range Status   Enterococcus species NOT DETECTED NOT DETECTED Final   Vancomycin resistance NOT DETECTED NOT DETECTED Final   Listeria monocytogenes NOT DETECTED NOT DETECTED Final   Staphylococcus species NOT DETECTED NOT DETECTED Final   Staphylococcus aureus NOT DETECTED NOT DETECTED Final   Methicillin resistance NOT DETECTED NOT DETECTED Final   Streptococcus species NOT DETECTED NOT DETECTED Final   Streptococcus agalactiae NOT DETECTED NOT DETECTED Final   Streptococcus pneumoniae NOT DETECTED NOT DETECTED Final   Streptococcus pyogenes NOT DETECTED NOT DETECTED Final   Acinetobacter baumannii NOT DETECTED NOT DETECTED Final   Enterobacteriaceae species DETECTED (A) NOT DETECTED Final    Comment: CRITICAL RESULT CALLED TO, READ BACK BY AND VERIFIED WITH: Nate Cookson @ [phone] 04/24/16 by [facility]    Enterobacter cloacae complex NOT DETECTED NOT DETECTED Final   Escherichia coli NOT DETECTED NOT DETECTED Final   Klebsiella oxytoca NOT DETECTED NOT DETECTED Final   Klebsiella pneumoniae DETECTED (A) NOT DETECTED Final    Comment: CRITICAL RESULT CALLED TO, READ BACK BY AND VERIFIED WITH: Nate Cookson @ [phone] 04/24/16 by [facility]    Proteus species NOT DETECTED NOT DETECTED Final   Serratia marcescens NOT DETECTED NOT DETECTED Final   Carbapenem resistance NOT DETECTED NOT DETECTED Final   Haemophilus influenzae NOT DETECTED NOT DETECTED Final   Neisseria meningitidis NOT DETECTED NOT DETECTED Final   Pseudomonas aeruginosa NOT DETECTED NOT DETECTED Final   Candida  albicans NOT DETECTED NOT DETECTED Final   Candida glabrata NOT DETECTED NOT DETECTED Final   Candida krusei NOT DETECTED NOT DETECTED Final   Candida parapsilosis NOT DETECTED NOT DETECTED Final   Candida tropicalis NOT DETECTED NOT DETECTED Final  Blood culture (routine x 2)     Status: None (Preliminary result)   Collection Time: 04/23/16  4:41 PM  Result Value Ref Range Status   Specimen Description BLOOD RIGHT ARM  Final   Special Requests   Final    BOTTLES DRAWN AEROBIC AND ANAEROBIC  AER 6CC ANA 2CC   Culture  Setup Time   Final    GRAM NEGATIVE RODS IN BOTH AEROBIC AND ANAEROBIC BOTTLES CRITICAL VALUE NOTED.  VALUE IS CONSISTENT WITH PREVIOUSLY REPORTED AND CALLED VALUE.    Culture   Final    GRAM NEGATIVE RODS IN BOTH AEROBIC AND ANAEROBIC BOTTLES IDENTIFICATION AND  SUSCEPTIBILITIES TO FOLLOW ONCE BETTER GROWTH    Report Status PENDING  Incomplete  Urine culture     Status: Abnormal (Preliminary result)   Collection Time: 04/23/16  4:41 PM  Result Value Ref Range Status   Specimen Description URINE, CLEAN CATCH  Final   Special Requests NONE  Final   Culture (A)  Final    >=100,000 COLONIES/mL GRAM NEGATIVE RODS IDENTIFICATION AND SUSCEPTIBILITIES TO FOLLOW    Report Status PENDING  Incomplete  MRSA PCR Screening     Status: Abnormal   Collection Time: 04/23/16  9:06 PM  Result Value Ref Range Status   MRSA by PCR POSITIVE (A) NEGATIVE Final    Comment:        The GeneXpert MRSA Assay (FDA approved for NASAL specimens only), is one component of a comprehensive MRSA colonization surveillance program. It is not intended to diagnose MRSA infection nor to guide or monitor treatment for MRSA infections. CRITICAL RESULT CALLED TO, READ BACK BY AND VERIFIED WITH: LESLIE LEWIS AT 2312 04/23/16.PMH      Coagulation Studies: No results for input(s): LABPROT, INR in the last 72 hours.  Urinalysis:  Recent Labs  04/23/16 1641  COLORURINE YELLOW*  LABSPEC 1.016   PHURINE 5.0  GLUCOSEU >500*  HGBUR 3+*  BILIRUBINUR NEGATIVE  KETONESUR TRACE*  PROTEINUR 30*  NITRITE NEGATIVE  LEUKOCYTESUR 2+*        Imaging: Dg Chest Portable 1 View  04/23/2016  CLINICAL DATA:  Post fall yesterday. History of diabetes, hypertension, asthma. EXAM: PORTABLE CHEST 1 VIEW COMPARISON:  None. FINDINGS: Heart size is upper normal. Lungs are clear. Lung volumes are normal. No pleural effusion or pneumothorax seen. No osseous fracture or dislocation identified. IMPRESSION: No acute findings. Electronically Signed   By: Bary Richard M.D.   On: 04/23/2016 17:21      Assessment & Plan: Pt is a 55 y.o. yo female with a PMHX of diabetes, hypertension, arthritis, hyperlipidemia, asthma, was admitted on 04/23/2016 with generalized weakness, found on the floor status post fall.   1. ARF, CKD unspecified 2. Sepsis - Klebsiella in blood, GNR in urine 3. Rhabdomyolysis 4. Lactic acidosis 5. Hyponatremia 6. Diabetic Ketoacidosis at presentation  Plan Renal U/S Maintain hemodynamic stability, patient to be started on levophed Avoid Nephrotoxins, Dose Meds for CrCl < 30 IV hydration until able to take adequate fluids orally Antibiotics as per primary team Sodium level has corrected   Case discussed with Patient, Dr Seth Bake and Patient's Mother We'll follow

## 2016-04-24 NOTE — Progress Notes (Signed)
[facility] [place] at [facility]   PATIENT NAME: [name]    MR#:  [id number]  DATE OF BIRTH:  Jan 09, 1961  SUBJECTIVE:  CHIEF COMPLAINT:   Chief Complaint  Patient presents with  . Fall  . Hyperglycemia  Patient is 55 year old Caucasian female with past medical history significant for history of diabetes mellitus type 2, essential hypertension, hyperlipidemia who was brought to the hospital with confusion, generalized weakness, fever, nausea and vomiting, decreased urination, decreased oral intake. On arrival to the hospital patient was noted to be in DKA with elevated anion gap, blood glucose levels, hyponatremia, lactic acidosis, rhabdomyolysis. Urinalysis revealed pyuria. She was admitted to the hospital with sepsis, DKA, she was initiated on Rocephin intravenously. She remains febrile with temperature reaching 105 intermittently, blood pressure remains low with systolic blood pressure in 70s, died IV fluid administration. Patient's labs revealed renal failure, questionable acute on chronic, no baseline creatinine is available. Patient feels very weak, denies any pain at present. Denies any nausea, vomiting, diarrhea Review of Systems  Constitutional: Positive for fever, chills and malaise/fatigue. Negative for weight loss.  HENT: Negative for congestion.   Eyes: Negative for blurred vision and double vision.  Respiratory: Negative for cough, sputum production, shortness of breath and wheezing.   Cardiovascular: Negative for chest pain, palpitations, orthopnea, leg swelling and PND.  Gastrointestinal: Negative for nausea, vomiting, abdominal pain, diarrhea, constipation and blood in stool.  Genitourinary: Negative for dysuria, urgency, frequency and hematuria.  Musculoskeletal: Negative for falls.  Neurological: Positive for weakness. Negative for dizziness, tremors, focal weakness and headaches.  Endo/Heme/Allergies: Does not bruise/bleed easily.   Psychiatric/Behavioral: Negative for depression. The patient does not have insomnia.     VITAL SIGNS: Blood pressure 83/55, pulse 102, temperature 98.6 F (37 C), temperature source Other (Comment), resp. rate 24, height 5\' 5"  (1.651 m), weight 117.7 kg (259 lb 7.7 oz), SpO2 97 %.  PHYSICAL EXAMINATION:   GENERAL:  55 y.o.-year-old patient lying in the bed with no acute distress.  Pale and weak EYES: Pupils equal, round, reactive to light and accommodation. No scleral icterus. Extraocular muscles intact.  HEENT: Head atraumatic, normocephalic. Oropharynx and nasopharynx clear.  NECK:  Supple, no jugular venous distention. No thyroid enlargement, no tenderness.  LUNGS:  Some diminished breath sounds bilaterally, no wheezing, rales,rhonchi or crepitation, poor effort . No use of accessory muscles of respiration.  CARDIOVASCULAR: S1, S2 normal. No murmurs, rubs, or gallops.  Tachycardic ABDOMEN: Soft,  minimally uncomfortable in suprapubic area, nondistended. Bowel sounds present. No organomegaly or mass.  Comes urine in Foley catheter is light yellow color, cloudy EXTREMITIES: No pedal edema, cyanosis, or clubbing.  NEUROLOGIC: Cranial nerves II through XII are intact. Muscle strength 5/5 in all extremities. Sensation intact. Gait not checked.  PSYCHIATRIC: The patient is alert and oriented x 3.  SKIN: No obvious rash, lesion, or ulcer.   ORDERS/RESULTS REVIEWED:   CBC  Recent Labs Lab 04/23/16 1641 04/23/16 2020 04/24/16 0439  WBC 9.6 6.5 5.3  HGB 10.6* 8.4* 10.1*  HCT 32.7* 25.3* 30.0*  PLT 203 152 168  MCV 86.1 84.0 82.9  MCH 27.9 27.8 27.8  MCHC 32.4 33.1 33.6  RDW 16.0* 15.9* 15.8*  LYMPHSABS 0.5*  --   --   MONOABS 0.3  --   --   EOSABS 0.0  --   --   BASOSABS 0.0  --   --    ------------------------------------------------------------------------------------------------------------------  Chemistries   Recent Labs Lab 04/23/16 1641  04/23/16 2020 04/24/16 0011  04/24/16 0439 04/24/16 0842  NA 117* 125* 129* 129* 130*  K 5.2* 3.6 4.1 3.6 3.6  CL 82* 100* 99* 102 101  CO2 18* 14* 17* 17* 18*  GLUCOSE 788* 486* 283* 118* 144*  BUN 99* 77* 85* 88* 82*  CREATININE 3.10* 2.36* 2.43* 2.53* 2.42*  CALCIUM 8.5* 6.8* 8.3* 8.0* 8.0*  AST 90*  --   --   --   --   ALT 45  --   --   --   --   ALKPHOS 61  --   --   --   --   BILITOT 0.9  --   --   --   --    ------------------------------------------------------------------------------------------------------------------ estimated creatinine clearance is 34.1 mL/min (by C-G formula based on Cr of 2.42). ------------------------------------------------------------------------------------------------------------------ No results for input(s): TSH, T4TOTAL, T3FREE, THYROIDAB in the last 72 hours.  Invalid input(s): FREET3  Cardiac Enzymes  Recent Labs Lab 04/23/16 1641  TROPONINI 0.03   ------------------------------------------------------------------------------------------------------------------ Invalid input(s): POCBNP ---------------------------------------------------------------------------------------------------------------  RADIOLOGY: Dg Chest Portable 1 View  04/23/2016  CLINICAL DATA:  Post fall yesterday. History of diabetes, hypertension, asthma. EXAM: PORTABLE CHEST 1 VIEW COMPARISON:  None. FINDINGS: Heart size is upper normal. Lungs are clear. Lung volumes are normal. No pleural effusion or pneumothorax seen. No osseous fracture or dislocation identified. IMPRESSION: No acute findings. Electronically Signed   By: Bary Richard M.D.   On: 04/23/2016 17:21    EKG:  Orders placed or performed during the hospital encounter of 04/23/16  . ED EKG  . ED EKG    ASSESSMENT AND PLAN:  Active Problems:   DKA (diabetic ketoacidosis) (HCC)   Pressure ulcer #1. Sepsis due to Klebsiella pneumonia, due to acute pyelonephritis, blood cultures revealed Klebsiella pneumonia, sensitivities are  pending, change Rocephin to Zosyn   #2. Septic shock, continue IV fluids at high rate, add Levophed keeping map at and above 65 #3. Acute renal failure likely due to ATN, continue IV fluids. Following kidney function closely, nephrology consultation is requested, renal ultrasound is ordered, results pending, continue Foley catheter following in's and outs closely. Discussed with nephrologist, Dr. Thedore Mins #4 DKA. Get hemoglobin A1c, continue insulin drip, initiate and long-acting insulin if needed, following bicarbonate level closely #5. Hyponatremia, improved with IV fluid administration, recheck sodium level in the morning, continue IV fluids, dehydration related likely #6. Hyperkalemia, resolved with IV fluids, insulin therapy, follow in the morning #7. Rhabdomyolysis, initiate patient on bicarbonate drip, following CK levels in the morning, follow kidney function tests #8. Lactic acidosis, due to hypoperfusion, continue IV fluids at high rate, pressors, following lactic acid level #9 Acute pyelonephritis, getting renal ultrasound, following clinically #10. Generalized weakness, involve physical therapist   Management plans discussed with the patient, family and they are in agreement.   DRUG ALLERGIES:  Allergies  Allergen Reactions  . Biaxin [Clarithromycin] Rash    Patient states this medication gives her severe rash and thrush in the mouth.    CODE STATUS:     Code Status Orders        Start     Ordered   04/23/16 1833  Full code   Continuous     04/23/16 1834    Code Status History    Date Active Date Inactive Code Status Order ID Comments User Context   This patient has a current code status but no historical code status.      TOTALCritical care TIME TAKING CARE OF  THIS PATIENT: 50 minutes.    Katharina Caper M.D on 04/24/2016 at 10:42 AM  Between 7am to 6pm - Pager - [phone]  After 6pm go to [email or web address] - password [email or web address] [facility]  [place] [facility]   Office  [phone]  CC: Primary care physician; Dortha Kern, MD

## 2016-04-24 NOTE — Evaluation (Signed)
Physical Therapy Evaluation Patient Details Name: [name] MRN: [id number] DOB: Mar 31, 1961 Today's Date: 04/24/2016   History of Present Illness  [name] is a 55 y.o. female with a known history of diabetes mellitus type 2, hypertension, hyperlipidemia brought in by family for confusion, generalized weakness. Patient was admitted with hyperglycemia with blood sugar levels >600. She also has sepsis and rhabdomyolysis.   Clinical Impression  55 yo Female came to ED after sustaining a fall due to hyperglycemia. Patient was independent in self care ADLs and did not use any assistive device for gait tasks. Currently she is max A for bed mobility. She has significant weakness in BLE which limits mobility. In addition her vital signs (BP) were low in supine and sitting. Patient was not appropriate to attempt standing today due to low BP and feeling weak/tired. She would benefit from skilled PT intervention to improve balance/gait safety and increase LE strength.     Follow Up Recommendations SNF    Equipment Recommendations   (to be determined post acute)    Recommendations for Other Services Rehab consult     Precautions / Restrictions Precautions Precautions: Fall Restrictions Weight Bearing Restrictions: No      Mobility  Bed Mobility Overal bed mobility: Needs Assistance Bed Mobility: Supine to Sit     Supine to sit: Max assist;HOB elevated     General bed mobility comments: patient is max A for initiating supine to sitting edge of bed; She required mod Vcs for foot placement and UE placement; Upon sitting, she required mod A for balance (see balance detail);   Transfers                 General transfer comment: unsafe at this time; patient reports feeling very weak and BP is in the 80's systolic; not appropriate for standing at this time;   Ambulation/Gait             General Gait Details: unable  Stairs            Wheelchair Mobility     Modified Rankin (Stroke Patients Only)       Balance Overall balance assessment: Needs assistance Sitting-balance support: Bilateral upper extremity supported Sitting balance-Leahy Scale: Poor Sitting balance - Comments: patient is mod A for sitting edge of bed with BUE HHA; She demonstrates posterior trunk lean and left lateral trunk lean;  Postural control: Posterior lean;Left lateral lean     Standing balance comment: not assessed                             Pertinent Vitals/Pain Pain Assessment: No/denies pain    Home Living Family/patient expects to be discharged to:: Private residence Living Arrangements: Alone   Type of Home: House Home Access: Stairs to enter Entrance Stairs-Rails: Left Entrance Stairs-Number of Steps: 4 Home Layout: One level Home Equipment: None      Prior Function Level of Independence: Independent         Comments: independent in all ADLs; used no AD     Hand Dominance        Extremity/Trunk Assessment   Upper Extremity Assessment: Generalized weakness           Lower Extremity Assessment: RLE deficits/detail;LLE deficits/detail RLE Deficits / Details: hip 3-/5, knee 3-/5, ankle 1/5; reports numbness but able to feel light touch LLE Deficits / Details: hip 3-/5, knee 3-/5, ankle 1/5 reports numbness but able to feel  light touch;      Communication   Communication: No difficulties  Cognition Arousal/Alertness: Awake/alert Behavior During Therapy: WFL for tasks assessed/performed Overall Cognitive Status: Within Functional Limits for tasks assessed                      General Comments      Exercises        Assessment/Plan    PT Assessment Patient needs continued PT services  PT Diagnosis Difficulty walking;Generalized weakness   PT Problem List Decreased strength;Decreased range of motion;Decreased activity tolerance;Decreased balance;Decreased mobility;Decreased safety awareness;Obesity   PT Treatment Interventions DME instruction;Gait training;Stair training;Functional mobility training;Therapeutic activities;Therapeutic exercise;Patient/family education;Neuromuscular re-education;Balance training   PT Goals (Current goals can be found in the Care Plan section) Acute Rehab PT Goals Patient Stated Goal: "I want to go home." PT Goal Formulation: With patient Time For Goal Achievement: 05/08/16 Potential to Achieve Goals: Fair    Frequency Min 2X/week   Barriers to discharge Inaccessible home environment;Decreased caregiver support has 4 steps to enter and lives alone; current functional mobility status limits ability to go home.    Co-evaluation               End of Session   Activity Tolerance: Patient limited by fatigue;Patient limited by lethargy;Treatment limited secondary to medical complications (Comment) (low BP in sitting; patient very tired/weak; ) Patient left: in bed;with call bell/phone within reach;with family/visitor present Nurse Communication: Mobility status         Time: 1255-1320 PT Time Calculation (min) (ACUTE ONLY): 25 min   Charges:   PT Evaluation $PT Eval Moderate Complexity: 1 Procedure     PT G Codes:        Trotter,Margaret PT, DPT 04/24/2016, 1:33 PM

## 2016-04-25 ENCOUNTER — Inpatient Hospital Stay: Payer: BLUE CROSS/BLUE SHIELD

## 2016-04-25 DIAGNOSIS — G8220 Paraplegia, unspecified: Secondary | ICD-10-CM

## 2016-04-25 LAB — BASIC METABOLIC PANEL
Anion gap: 13 (ref 5–15)
BUN: 67 mg/dL — AB (ref 6–20)
CO2: 25 mmol/L (ref 22–32)
CREATININE: 1.64 mg/dL — AB (ref 0.44–1.00)
Calcium: 7.3 mg/dL — ABNORMAL LOW (ref 8.9–10.3)
Chloride: 93 mmol/L — ABNORMAL LOW (ref 101–111)
GFR calc Af Amer: 40 mL/min — ABNORMAL LOW (ref >60)
GFR, EST NON AFRICAN AMERICAN: 34 mL/min — AB (ref >60)
GLUCOSE: 282 mg/dL — AB (ref 65–99)
POTASSIUM: 3.3 mmol/L — AB (ref 3.5–5.1)
SODIUM: 131 mmol/L — AB (ref 135–145)

## 2016-04-25 LAB — URINE CULTURE: Culture: >=100000 — AB

## 2016-04-25 LAB — CBC
HEMATOCRIT: 28.5 % — AB (ref 35.0–47.0)
Hemoglobin: 9.6 g/dL — ABNORMAL LOW (ref 12.0–16.0)
MCH: 27.5 pg (ref 26.0–34.0)
MCHC: 33.7 g/dL (ref 32.0–36.0)
MCV: 81.4 fL (ref 80.0–100.0)
PLATELETS: 134 10*3/uL — AB (ref 150–440)
RBC: 3.5 MIL/uL — ABNORMAL LOW (ref 3.80–5.20)
RDW: 15.7 % — AB (ref 11.5–14.5)
WBC: 5.7 10*3/uL (ref 3.6–11.0)

## 2016-04-25 LAB — CK: Total CK: 1395 U/L — ABNORMAL HIGH (ref 38–234)

## 2016-04-25 LAB — GLUCOSE, CAPILLARY
GLUCOSE-CAPILLARY: 113 mg/dL — AB (ref 65–99)
Glucose-Capillary: 332 mg/dL — ABNORMAL HIGH (ref 65–99)
Glucose-Capillary: 300 mg/dL — ABNORMAL HIGH (ref 65–99)
Glucose-Capillary: 265 mg/dL — ABNORMAL HIGH (ref 65–99)

## 2016-04-25 LAB — TSH: TSH: 1.476 u[IU]/mL (ref 0.350–4.500)

## 2016-04-25 LAB — RAPID HIV SCREEN (HIV 1/2 AB+AG)
HIV 1/2 Antibodies: NONREACTIVE
HIV-1 P24 Antigen - HIV24: NONREACTIVE

## 2016-04-25 LAB — VITAMIN B12: Vitamin B-12: 4359 pg/mL — ABNORMAL HIGH (ref 180–914)

## 2016-04-25 MED ORDER — INSULIN GLARGINE 100 UNIT/ML ~~LOC~~ SOLN
30.0000 [IU] | SUBCUTANEOUS | Status: DC
  Administered 2016-04-25: 30 [IU] via SUBCUTANEOUS
  Filled 2016-04-25 (×2): qty 0.3

## 2016-04-25 MED ORDER — FAMOTIDINE IN NACL 20-0.9 MG/50ML-% IV SOLN
20.0000 mg | Freq: Two times a day (BID) | INTRAVENOUS | Status: DC
  Administered 2016-04-25 – 2016-04-27 (×5): 20 mg via INTRAVENOUS
  Filled 2016-04-25 (×7): qty 50

## 2016-04-25 MED ORDER — INSULIN ASPART 100 UNIT/ML ~~LOC~~ SOLN
0.0000 [IU] | Freq: Every day | SUBCUTANEOUS | Status: DC
  Administered 2016-04-25 – 2016-04-26 (×2): 2 [IU] via SUBCUTANEOUS
  Filled 2016-04-25: qty 2

## 2016-04-25 MED ORDER — SODIUM CHLORIDE 0.9 % IV BOLUS (SEPSIS)
1000.0000 mL | Freq: Once | INTRAVENOUS | Status: AC
  Administered 2016-04-25: 1000 mL via INTRAVENOUS

## 2016-04-25 MED ORDER — INSULIN ASPART 100 UNIT/ML ~~LOC~~ SOLN
4.0000 [IU] | Freq: Three times a day (TID) | SUBCUTANEOUS | Status: DC
  Administered 2016-04-25 – 2016-04-27 (×6): 4 [IU] via SUBCUTANEOUS
  Filled 2016-04-25 (×7): qty 4

## 2016-04-25 MED ORDER — POTASSIUM CHLORIDE 10 MEQ/100ML IV SOLN
10.0000 meq | INTRAVENOUS | Status: AC
  Administered 2016-04-25 (×4): 10 meq via INTRAVENOUS
  Filled 2016-04-25 (×4): qty 100

## 2016-04-25 MED ORDER — POTASSIUM CHLORIDE CRYS ER 20 MEQ PO TBCR
40.0000 meq | EXTENDED_RELEASE_TABLET | Freq: Once | ORAL | Status: AC
  Administered 2016-04-25: 40 meq via ORAL
  Filled 2016-04-25: qty 2

## 2016-04-25 MED ORDER — INSULIN ASPART 100 UNIT/ML ~~LOC~~ SOLN
0.0000 [IU] | Freq: Three times a day (TID) | SUBCUTANEOUS | Status: DC
  Administered 2016-04-25: 8 [IU] via SUBCUTANEOUS
  Administered 2016-04-26 (×2): 5 [IU] via SUBCUTANEOUS
  Administered 2016-04-26 – 2016-04-27 (×2): 3 [IU] via SUBCUTANEOUS
  Administered 2016-04-27: 12:00:00 8 [IU] via SUBCUTANEOUS
  Administered 2016-04-27 – 2016-04-30 (×4): 2 [IU] via SUBCUTANEOUS
  Filled 2016-04-25: qty 2
  Filled 2016-04-25: qty 3
  Filled 2016-04-25: qty 2
  Filled 2016-04-25: qty 5
  Filled 2016-04-25: qty 2
  Filled 2016-04-25: qty 8
  Filled 2016-04-25 (×2): qty 2
  Filled 2016-04-25: qty 5
  Filled 2016-04-25: qty 8
  Filled 2016-04-25: qty 3
  Filled 2016-04-25: qty 2

## 2016-04-25 MED ORDER — ENOXAPARIN SODIUM 40 MG/0.4ML ~~LOC~~ SOLN
40.0000 mg | Freq: Two times a day (BID) | SUBCUTANEOUS | Status: DC
  Administered 2016-04-25 – 2016-05-02 (×14): 40 mg via SUBCUTANEOUS
  Filled 2016-04-25 (×14): qty 0.4

## 2016-04-25 MED ORDER — SODIUM CHLORIDE 0.9 % IV SOLN
INTRAVENOUS | Status: DC
  Administered 2016-04-25 – 2016-04-29 (×6): via INTRAVENOUS

## 2016-04-25 NOTE — Care Management (Signed)
Patient admitted to icu with with DKA and sepsis with septic shock, and acute renal failure.  Renal status and glycemic status is improving. .  Presents from home.  CM unable to speak with patient as she is off the unit

## 2016-04-25 NOTE — Clinical Social Work Note (Signed)
Clinical Social Work Assessment  Patient Details  Name: [name] MRN: [id number] Date of Birth: 03-May-1961  Date of referral:  04/25/16               Reason for consult:  Facility Placement                Permission sought to share information with:  Family Supports Permission granted to share information::  Yes, Verbal Permission Granted   Housing/Transportation Living arrangements for the past 2 months:  Single Family Home Source of Information:  Patient Patient Interpreter Needed:  None Criminal Activity/Legal Involvement Pertinent to Current Situation/Hospitalization:  No - Comment as needed Significant Relationships:  Friend Lives with:  Self Do you feel safe going back to the place where you live?  Yes Need for family participation in patient care:  No (Coment)  Care giving concerns:  No care giving concerns identified.   Social Worker assessment / plan:  CSW met with pt to address consult. CSW introduced herself and explained role of social work. CSW also explained discharging to SNF with [employer]. Insurance has been run to determine SNF benefit. PT is recommending SNF. Pt lives alone and still works. Pt shared that her mother is unable to assist her at home due to health reasons. CSW initiated a SNF search and will follow up with bed offers. CSW will continue to follow.   Employment status:  Therapist, music:  Managed Care PT Recommendations:  [place] / Referral to community resources:  [place]  Patient/Family's Response to care:  Pt was appreciative of CSW support.   Patient/Family's Understanding of and Emotional Response to Diagnosis, Current Treatment, and Prognosis:  Pt understands that STR at SNF would be beneficial for her.   Emotional Assessment Appearance:  Appears older than stated age Attitude/Demeanor/Rapport:  Unable to Assess, Other (appropriate) Affect (typically observed):  Pleasant Orientation:   Oriented to Self, Oriented to Place, Oriented to  Time, Oriented to Situation Alcohol / Substance use:  Never Used Psych involvement (Current and /or in the community):  No (Comment)  Discharge Needs  Concerns to be addressed:  Adjustment to Illness Readmission within the last 30 days:  No Current discharge risk:  Chronically ill Barriers to Discharge:  Continued Medical Work up   [employer], LCSW 04/25/2016, 4:12 PM

## 2016-04-25 NOTE — Consult Note (Signed)
Reason for Consult:LE weakness and numbness Referring Physician: Winona Legato  CC: Lower extremity numbness and weakness  HPI: [name] is an 55 y.o. female admitted after a fall at home after which she was unable to get up.  Was found to be in DKA and septic.  Patient reports that she has had lower extremity numbness and weakness since presentation.  Reports no saddle anesthesia.  Foley in place.  Reports no worsening or improvement since admission.    Past Medical History  Diagnosis Date  . Diabetes mellitus without complication (HCC)   . Hypertension   . Arthritis   . Asthma   . Hyperlipemia     Past Surgical History  Procedure Laterality Date  . Anterior cruciate ligament repair      Family history: Mother still living.  Has a history of DM, HTN and stroke.  Father deceased from liver failure.    Social History:  reports that she has been smoking.  She does not have any smokeless tobacco history on file. She reports that she does not drink alcohol. Her drug history is not on file.  Allergies  Allergen Reactions  . Biaxin [Clarithromycin] Rash    Patient states this medication gives her severe rash and thrush in the mouth.    Medications:  I have reviewed the patient's current medications. Prior to Admission:  Prescriptions prior to admission  Medication Sig Dispense Refill Last Dose  . ALPRAZolam (XANAX) 0.5 MG tablet Take 0.5 mg by mouth 2 (two) times daily as needed for anxiety or sleep.   Past Month at Unknown time  . atorvastatin (LIPITOR) 20 MG tablet Take 20 mg by mouth at bedtime.  12 04/19/2016  . Cholecalciferol (VITAMIN D3) 5000 units CAPS Take 5,000 Units by mouth daily.   04/20/2016  . fenofibrate 160 MG tablet Take 160 mg by mouth every evening.  3 04/19/2016  . hydrochlorothiazide (HYDRODIURIL) 25 MG tablet Take 25 mg by mouth daily.  3 04/20/2016  . ipratropium-albuterol (DUONEB) 0.5-2.5 (3) MG/3ML SOLN Take 3 mLs by nebulization 4 (four) times daily as needed.  For wheezing.  2 Past Month at Unknown time  . JANUVIA 100 MG tablet Take 100 mg by mouth daily.  11 04/20/2016  . latanoprost (XALATAN) 0.005 % ophthalmic solution Place 1 drop into both eyes at bedtime.  4 04/19/2016  . lisinopril-hydrochlorothiazide (PRINZIDE,ZESTORETIC) 10-12.5 MG tablet Take 1 tablet by mouth daily.  3 04/20/2016  . metFORMIN (GLUCOPHAGE-XR) 500 MG 24 hr tablet Take 1,000 mg by mouth 2 (two) times daily.  3 04/20/2016  . oxybutynin (DITROPAN-XL) 10 MG 24 hr tablet Take 10 mg by mouth at bedtime.  3 04/19/2016  . pioglitazone (ACTOS) 45 MG tablet Take 45 mg by mouth daily.  3 04/20/2016  . ranitidine (ZANTAC) 300 MG capsule Take 300 mg by mouth daily.  3 04/20/2016  . VICTOZA 18 MG/3ML SOPN Inject 1.8 mg into the skin daily.  12 04/20/2016  . zolpidem (AMBIEN) 5 MG tablet Take 5 mg by mouth at bedtime as needed for sleep.   unknown   Scheduled: . cefTRIAXone (ROCEPHIN)  IV  2 g Intravenous Q24H  . Chlorhexidine Gluconate Cloth  6 each Topical Q0600  . docusate sodium  100 mg Oral BID  . heparin  5,000 Units Subcutaneous Q8H  . insulin aspart  0-9 Units Subcutaneous TID AC & HS  . insulin glargine  10 Units Subcutaneous Q24H  . mupirocin ointment  1 application Nasal BID  .  nicotine  21 mg Transdermal Daily  . nystatin  5 mL Oral QID  . potassium chloride  10 mEq Intravenous Q1 Hr x 4  . sodium chloride  1,000 mL Intravenous Once    ROS: History obtained from the patient  General ROS: negative for - chills, fatigue, fever, night sweats, weight gain or weight loss Psychological ROS: negative for - behavioral disorder, hallucinations, memory difficulties, mood swings or suicidal ideation Ophthalmic ROS: negative for - blurry vision, double vision, eye pain or loss of vision ENT ROS: oral lesions, difficulty swallowing Allergy and Immunology ROS: negative for - hives or itchy/watery eyes Hematological and Lymphatic ROS: negative for - bleeding problems, bruising or swollen  lymph nodes Endocrine ROS: negative for - galactorrhea, hair pattern changes, polydipsia/polyuria or temperature intolerance Respiratory ROS: negative for - cough, hemoptysis, shortness of breath or wheezing Cardiovascular ROS: negative for - chest pain, dyspnea on exertion, edema or irregular heartbeat Gastrointestinal ROS: negative for - abdominal pain, diarrhea, hematemesis, nausea/vomiting or stool incontinence Genito-Urinary ROS: negative for - dysuria, hematuria, incontinence or urinary frequency/urgency Musculoskeletal ROS: negative for - joint swelling or muscular weakness Neurological ROS: as noted in HPI Dermatological ROS: negative for rash and skin lesion changes  Physical Examination: Blood pressure 111/51, pulse 124, temperature 100 F (37.8 C), temperature source Other (Comment), resp. rate 27, height 5\' 5"  (1.651 m), weight 122.3 kg (269 lb 10 oz), SpO2 92 %.  HEENT-  Normocephalic, no lesions, without obvious abnormality.  Normal external eye and conjunctiva.  Normal TM's bilaterally.  Normal auditory canals and external ears. Normal external nose, mucus membranes and septum.  Normal pharynx. Cardiovascular- S1, S2 normal, pulses palpable throughout   Lungs- chest clear, no wheezing, rales, normal symmetric air entry Abdomen- soft, non-tender; bowel sounds normal; no masses,  no organomegaly Extremities- no edema Lymph-no adenopathy palpable Musculoskeletal-no joint tenderness, deformity or swelling Skin-warm and dry, no hyperpigmentation, vitiligo, or suspicious lesions  Neurological Examination Mental Status: Alert, oriented, thought content appropriate.  Speech fluent without evidence of aphasia.  Able to follow 3 step commands without difficulty. Cranial Nerves: II: Discs flat bilaterally; Visual fields grossly normal, pupils equal, round, reactive to light and accommodation III,IV, VI: ptosis not present, extra-ocular motions intact bilaterally V,VII: smile  symmetric, facial light touch sensation normal bilaterally VIII: hearing normal bilaterally IX,X: gag reflex present XI: bilateral shoulder shrug XII: midline tongue extension Motor: Right : Upper extremity   5/5    Left:     Upper extremity   5/5 Some flexion noted at the knees at 3/5.  Unable to lift the legs off the bed.  Unable to flex or extend at the ankles.  Unable to wiggle the toes.   Sensory: Exam inconsistent but reports decreased pinprick and light touch on the left from the umbilicus down, worse below the knee.  Decreased pinprick and light touch below the knee on the right leg.   Deep Tendon Reflexes: 1+ at the knees and absent at the ankles.   Plantars: Right: mute   Left: mute Cerebellar: Normal finger-to-nose testing bilaterally, unable to perform heel-to-shin testing Gait: not tested due to safety concerns      Laboratory Studies:   Basic Metabolic Panel:  Recent Labs Lab 04/24/16 0842 04/24/16 1255 04/24/16 1823 04/24/16 2322 04/25/16 0600  NA 130* 131* 130* 131* 131*  K 3.6 3.9 3.6 3.0* 3.3*  CL 101 101 97* 95* 93*  CO2 18* 17* 21* 23 25  GLUCOSE 144* 177* 217* 131*  282*  BUN 82* 85* 80* 71* 67*  CREATININE 2.42* 2.34* 2.18* 1.83* 1.64*  CALCIUM 8.0* 7.9* 7.6* 7.6* 7.3*    Liver Function Tests:  Recent Labs Lab 04/23/16 1641  AST 90*  ALT 45  ALKPHOS 61  BILITOT 0.9  PROT 7.4  ALBUMIN 2.7*   No results for input(s): LIPASE, AMYLASE in the last 168 hours. No results for input(s): AMMONIA in the last 168 hours.  CBC:  Recent Labs Lab 04/23/16 1641 04/23/16 2020 04/24/16 0439 04/25/16 0600  WBC 9.6 6.5 5.3 5.7  NEUTROABS 8.8*  --   --   --   HGB 10.6* 8.4* 10.1* 9.6*  HCT 32.7* 25.3* 30.0* 28.5*  MCV 86.1 84.0 82.9 81.4  PLT 203 152 168 134*    Cardiac Enzymes:  Recent Labs Lab 04/23/16 1641 04/23/16 1642 04/23/16 2020 04/24/16 0730 04/25/16 0600  CKTOTAL  --  5665* 3670* 2934* 1395*  TROPONINI 0.03  --   --   --   --      BNP: Invalid input(s): POCBNP  CBG:  Recent Labs Lab 04/24/16 2127 04/24/16 2220 04/24/16 2340 04/25/16 0025 04/25/16 0755  GLUCAP 163* 146* 140* 113* 332*    Microbiology: Results for orders placed or performed during the hospital encounter of 04/23/16  Blood culture (routine x 2)     Status: Abnormal (Preliminary result)   Collection Time: 04/23/16  4:35 PM  Result Value Ref Range Status   Specimen Description BLOOD RIGHT ANTECUBITAL  Final   Special Requests   Final    BOTTLES DRAWN AEROBIC AND ANAEROBIC  AER 7CC ANA 1CC   Culture  Setup Time   Final    GRAM NEGATIVE RODS IN BOTH AEROBIC AND ANAEROBIC BOTTLES CRITICAL RESULT CALLED TO, READ BACK BY AND VERIFIED WITH: Nate Cookson @ 3536 04/24/16 by [facility]    Culture (A)  Final    KLEBSIELLA PNEUMONIAE IN BOTH AEROBIC AND ANAEROBIC BOTTLES SUSCEPTIBILITIES TO FOLLOW ONCE BETTER GROWTH    Report Status PENDING  Incomplete  Blood Culture ID Panel (Reflexed)     Status: Abnormal   Collection Time: 04/23/16  4:35 PM  Result Value Ref Range Status   Enterococcus species NOT DETECTED NOT DETECTED Final   Vancomycin resistance NOT DETECTED NOT DETECTED Final   Listeria monocytogenes NOT DETECTED NOT DETECTED Final   Staphylococcus species NOT DETECTED NOT DETECTED Final   Staphylococcus aureus NOT DETECTED NOT DETECTED Final   Methicillin resistance NOT DETECTED NOT DETECTED Final   Streptococcus species NOT DETECTED NOT DETECTED Final   Streptococcus agalactiae NOT DETECTED NOT DETECTED Final   Streptococcus pneumoniae NOT DETECTED NOT DETECTED Final   Streptococcus pyogenes NOT DETECTED NOT DETECTED Final   Acinetobacter baumannii NOT DETECTED NOT DETECTED Final   Enterobacteriaceae species DETECTED (A) NOT DETECTED Final    Comment: CRITICAL RESULT CALLED TO, READ BACK BY AND VERIFIED WITH: Nate Cookson @ 1443 04/24/16 by [facility]    Enterobacter cloacae complex NOT DETECTED NOT DETECTED Final   Escherichia coli NOT  DETECTED NOT DETECTED Final   Klebsiella oxytoca NOT DETECTED NOT DETECTED Final   Klebsiella pneumoniae DETECTED (A) NOT DETECTED Final    Comment: CRITICAL RESULT CALLED TO, READ BACK BY AND VERIFIED WITH: Nate Cookson @ [phone] 04/24/16 by [facility]    Proteus species NOT DETECTED NOT DETECTED Final   Serratia marcescens NOT DETECTED NOT DETECTED Final   Carbapenem resistance NOT DETECTED NOT DETECTED Final   Haemophilus influenzae NOT DETECTED NOT DETECTED Final  Neisseria meningitidis NOT DETECTED NOT DETECTED Final   Pseudomonas aeruginosa NOT DETECTED NOT DETECTED Final   Candida albicans NOT DETECTED NOT DETECTED Final   Candida glabrata NOT DETECTED NOT DETECTED Final   Candida krusei NOT DETECTED NOT DETECTED Final   Candida parapsilosis NOT DETECTED NOT DETECTED Final   Candida tropicalis NOT DETECTED NOT DETECTED Final  Blood culture (routine x 2)     Status: None (Preliminary result)   Collection Time: 04/23/16  4:41 PM  Result Value Ref Range Status   Specimen Description BLOOD RIGHT ARM  Final   Special Requests   Final    BOTTLES DRAWN AEROBIC AND ANAEROBIC  AER 6CC ANA 2CC   Culture  Setup Time   Final    GRAM NEGATIVE RODS IN BOTH AEROBIC AND ANAEROBIC BOTTLES CRITICAL VALUE NOTED.  VALUE IS CONSISTENT WITH PREVIOUSLY REPORTED AND CALLED VALUE.    Culture   Final    GRAM NEGATIVE RODS IN BOTH AEROBIC AND ANAEROBIC BOTTLES IDENTIFICATION AND SUSCEPTIBILITIES TO FOLLOW ONCE BETTER GROWTH    Report Status PENDING  Incomplete  Urine culture     Status: Abnormal   Collection Time: 04/23/16  4:41 PM  Result Value Ref Range Status   Specimen Description URINE, CLEAN CATCH  Final   Special Requests NONE  Final   Culture >=100,000 COLONIES/mL KLEBSIELLA PNEUMONIAE (A)  Final   Report Status 04/25/2016 FINAL  Final   Organism ID, Bacteria KLEBSIELLA PNEUMONIAE (A)  Final      Susceptibility   Klebsiella pneumoniae - MIC*    AMPICILLIN >=32 RESISTANT Resistant     CEFAZOLIN  <=4 SENSITIVE Sensitive     CEFTRIAXONE <=1 SENSITIVE Sensitive     CIPROFLOXACIN <=0.25 SENSITIVE Sensitive     GENTAMICIN <=1 SENSITIVE Sensitive     IMIPENEM <=0.25 SENSITIVE Sensitive     NITROFURANTOIN 32 SENSITIVE Sensitive     TRIMETH/SULFA <=20 SENSITIVE Sensitive     AMPICILLIN/SULBACTAM 8 SENSITIVE Sensitive     PIP/TAZO <=4 SENSITIVE Sensitive     Extended ESBL NEGATIVE Sensitive     * >=100,000 COLONIES/mL KLEBSIELLA PNEUMONIAE  MRSA PCR Screening     Status: Abnormal   Collection Time: 04/23/16  9:06 PM  Result Value Ref Range Status   MRSA by PCR POSITIVE (A) NEGATIVE Final    Comment:        The GeneXpert MRSA Assay (FDA approved for NASAL specimens only), is one component of a comprehensive MRSA colonization surveillance program. It is not intended to diagnose MRSA infection nor to guide or monitor treatment for MRSA infections. CRITICAL RESULT CALLED TO, READ BACK BY AND VERIFIED WITH: Mikalyn Hermida LEWIS AT 2312 04/23/16.PMH     Coagulation Studies: No results for input(s): LABPROT, INR in the last 72 hours.  Urinalysis:  Recent Labs Lab 04/23/16 1641  COLORURINE YELLOW*  LABSPEC 1.016  PHURINE 5.0  GLUCOSEU >500*  HGBUR 3+*  BILIRUBINUR NEGATIVE  KETONESUR TRACE*  PROTEINUR 30*  NITRITE NEGATIVE  LEUKOCYTESUR 2+*    Lipid Panel:  No results found for: CHOL, TRIG, HDL, CHOLHDL, VLDL, LDLCALC  HgbA1C:  Lab Results  Component Value Date   HGBA1C 11.1* 04/24/2016    Urine Drug Screen:  No results found for: LABOPIA, COCAINSCRNUR, LABBENZ, AMPHETMU, THCU, LABBARB  Alcohol Level: No results for input(s): ETH in the last 168 hours.   Imaging: US Renal  04/24/2016  CLINICAL DATA:  Acute renal failure EXAM: RENAL / URINARY TRACT ULTRASOUND COMPLETE COMPARISON:  None. FINDINGS: Right  Kidney: Length: 11.7 cm. Echogenicity within normal limits. No mass or hydronephrosis visualized. Left Kidney: Length: 11.0 cm. Echogenicity within normal limits. No mass  or hydronephrosis visualized. Bladder: Bladder not evaluated as it is decompressed by Foley catheter. IMPRESSION: Kidneys are normal. Electronically Signed   By: Esperanza Heir M.D.   On: 04/24/2016 12:05   Dg Chest Portable 1 View  04/23/2016  CLINICAL DATA:  Post fall yesterday. History of diabetes, hypertension, asthma. EXAM: PORTABLE CHEST 1 VIEW COMPARISON:  None. FINDINGS: Heart size is upper normal. Lungs are clear. Lung volumes are normal. No pleural effusion or pneumothorax seen. No osseous fracture or dislocation identified. IMPRESSION: No acute findings. Electronically Signed   By: Bary Richard M.D.   On: 04/23/2016 17:21     Assessment/Plan: 55 year old female with lower extremity numbness and weakness.  It is unclear if this is what caused the fall or if this developed shortly after.  Spinal cord infarct remains in differential but will rule out other possible metabolic causes as well.  Without progression GBS seems less likely.    Recommendations: 1.  B12, RPR, B1, TSH, Vitamin D 2.  MRI of the thoracic and lumbar spine.    Thana Farr, MD Neurology [phone] 04/25/2016, 10:27 AM

## 2016-04-25 NOTE — Clinical Documentation Improvement (Signed)
Hospitalist  Can the diagnosis of CKD be further specified?   CKD Stage I - GFR greater than or equal to 90  CKD Stage II - GFR 60-89  CKD Stage III - GFR 30-59  CKD Stage IV - GFR 15-29  CKD Stage V - GFR < 15  ESRD (End Stage Renal Disease)  Other condition  Unable to clinically determine   Supporting Information: : (risk factors, signs and symptoms, diagnostics, treatment) BUN on admission - 77 and has been as high as 88.  Creatinine on admission was 2.36 and as high as 2.53.  GFR was 22 on admission and currently is 34.  States in medical record - undetermined CKD.  Please exercise your independent, professional judgment when responding. A specific answer is not anticipated or expected.   Thank Modesta Messing [facility] Health Information Management [place] [phone]

## 2016-04-25 NOTE — Progress Notes (Signed)
Per MD Kasa ok for patient to go to MRI without RN accompanying

## 2016-04-25 NOTE — [place] FL2 (Signed)
[place] MEDICAID FL2 LEVEL OF CARE SCREENING TOOL     IDENTIFICATION  Patient Name: [name] Birthdate: 27-Oct-1961 Sex: female Admission Date (Current Location): 04/23/2016  [place] and [place] Number:  Chiropodist and Address:  [facility], [address] [place], [place] [address]      Provider Number: [id number]  Attending Physician Name and Address:  Katharina Caper, MD  Relative Name and Phone Number:       Current Level of Care: Hospital Recommended Level of Care: Skilled Nursing Facility Prior Approval Number:    Date Approved/Denied:   PASRR Number: [id number]  Discharge Plan: SNF    Current Diagnoses: Patient Active Problem List   Diagnosis Date Noted  . Pressure ulcer 04/24/2016  . DKA (diabetic ketoacidosis) (HCC) 04/23/2016    Orientation RESPIRATION BLADDER Height & Weight     Self, Time, Situation, Place  Normal Continent Weight: 269 lb 10 oz (122.3 kg) Height:  5\' 5"  (165.1 cm)  BEHAVIORAL SYMPTOMS/MOOD NEUROLOGICAL BOWEL NUTRITION STATUS      Continent Diet (Carb Modified, Thin Liquids)  AMBULATORY STATUS COMMUNICATION OF NEEDS Skin   Extensive Assist Verbally PU Stage and Appropriate Care (Deep tissue injury to sacrum, ischium and bilateral posterior upper thighs; Cleanse deep tissue injury to posterior upper thighs, buttocks and sacral wounds with soap and water. Apply silicone foam dressing, change every other day, turn/reposition 2 hrs)                       Personal Care Assistance Level of Assistance  Bathing, Feeding, Dressing Bathing Assistance: Maximum assistance Feeding assistance: Independent Dressing Assistance: Maximum assistance     Functional Limitations Info  Sight, Hearing, Speech Sight Info: Adequate Hearing Info: Adequate Speech Info: Adequate    SPECIAL CARE FACTORS FREQUENCY  PT (By licensed PT)     PT Frequency: 5              Contractures Contractures Info: Not  present    Additional Factors Info  Code Status, Allergies, Insulin Sliding Scale, Isolation Precautions Code Status Info: Full Code  Allergies Info: Biaxin   Insulin Sliding Scale Info: 4x/day Isolation Precautions Info: Contact Isolation for MRSA     Current Medications (04/25/2016):  This is the current hospital active medication list Current Facility-Administered Medications  Medication Dose Route Frequency Provider Last Rate Last Dose  . 0.9 %  sodium chloride infusion   Intravenous Continuous Munsoor Lateef, MD 100 mL/hr at 04/25/16 1247    . acetaminophen (TYLENOL) tablet 650 mg  650 mg Oral Q6H PRN 06/25/16, MD   650 mg at 04/25/16 0033   Or  . acetaminophen (TYLENOL) suppository 650 mg  650 mg Rectal Q6H PRN 06/25/16, MD      . bisacodyl (DULCOLAX) EC tablet 5 mg  5 mg Oral Daily PRN Katha Hamming, MD      . cefTRIAXone (ROCEPHIN) 2 g in dextrose 5 % 50 mL IVPB  2 g Intravenous Q24H Katha Hamming, MD   2 g at 04/25/16 0631  . Chlorhexidine Gluconate Cloth 2 % PADS 6 each  6 each Topical Q0600 06/25/16, MD   6 each at 04/25/16 0631  . chlorpheniramine-HYDROcodone (TUSSIONEX) 10-8 MG/5ML suspension 5 mL  5 mL Oral Q12H PRN 06/25/16, MD   5 mL at 04/24/16 1453  . docusate sodium (COLACE) capsule 100 mg  100 mg Oral BID 04/26/16, MD   100 mg  at 04/25/16 0911  . enoxaparin (LOVENOX) injection 40 mg  40 mg Subcutaneous Q12H Erin Fulling, MD      . famotidine (PEPCID) IVPB 20 mg premix  20 mg Intravenous Q12H Erin Fulling, MD   20 mg at 04/25/16 1258  . HYDROcodone-acetaminophen (NORCO/VICODIN) 5-325 MG per tablet 1-2 tablet  1-2 tablet Oral Q4H PRN Katha Hamming, MD      . insulin aspart (novoLOG) injection 0-15 Units  0-15 Units Subcutaneous TID WC Katharina Caper, MD      . insulin aspart (novoLOG) injection 0-5 Units  0-5 Units Subcutaneous QHS Katharina Caper, MD      . insulin aspart (novoLOG) injection 4 Units  4 Units  Subcutaneous TID WC Katharina Caper, MD      . insulin glargine (LANTUS) injection 30 Units  30 Units Subcutaneous Q24H Katharina Caper, MD      . mupirocin ointment (BACTROBAN) 2 % 1 application  1 application Nasal BID Katha Hamming, MD   1 application at 04/25/16 0911  . nicotine (NICODERM CQ - dosed in mg/24 hours) patch 21 mg  21 mg Transdermal Daily Arnaldo Natal, MD   21 mg at 04/25/16 0911  . norepinephrine (LEVOPHED) 4mg  in D5W premix infusion  0-40 mcg/min Intravenous Titrated , RPH   Stopped at 04/24/16 1103  . nystatin (MYCOSTATIN) 100000 UNIT/ML suspension 500,000 Units  5 mL Oral QID 04/26/16, MD   500,000 Units at 04/25/16 0911  . ondansetron (ZOFRAN) tablet 4 mg  4 mg Oral Q6H PRN 06/25/16, MD       Or  . ondansetron (ZOFRAN) injection 4 mg  4 mg Intravenous Q6H PRN Katha Hamming, MD      . traZODone (DESYREL) tablet 25 mg  25 mg Oral QHS PRN Katha Hamming, MD         Discharge Medications: Please see discharge summary for a list of discharge medications.  Relevant Imaging Results:  Relevant Lab Results:   Additional Information SSN:  Katha Hamming  [id number], LCSW

## 2016-04-25 NOTE — Progress Notes (Signed)
Bolus going for elevated HR

## 2016-04-25 NOTE — Progress Notes (Signed)
Patient A&O x 4 this shift, very responsive, IV in right hand DCd, occluded, IV K runs, bolus given and NS now going at 100, patient cannot really feel her feet and legs well, MRI ordered of lumbar spine, wound care visited patient.  Temp probe foley replaced with regular foley.  Output WDL, pt still tachy in 120s.  Will transfer to 1C this afternoon.

## 2016-04-25 NOTE — Clinical Social Work Placement (Signed)
   CLINICAL SOCIAL WORK PLACEMENT  NOTE  Date:  04/25/2016  Patient Details  Name: [name] MRN: [id number] Date of Birth: 12/21/1961  Clinical Social Work is seeking post-discharge placement for this patient at the Skilled  Nursing Facility level of care (*CSW will initial, date and re-position this form in  chart as items are completed):  Yes   Patient/family provided with [facility] Clinical Social Work Department's list of facilities offering this level of care within the geographic area requested by the patient (or if unable, by the patient's family).  Yes   Patient/family informed of their freedom to choose among providers that offer the needed level of care, that participate in Medicare, Medicaid or managed care program needed by the patient, have an available bed and are willing to accept the patient.  Yes   Patient/family informed of [facility]'s ownership interest in [facility] and [facility], as well as of the fact that they are under no obligation to receive care at these facilities.  PASRR submitted to EDS on 04/25/16     PASRR number received on 04/25/16     Existing PASRR number confirmed on       FL2 transmitted to all facilities in geographic area requested by pt/family on 04/25/16     FL2 transmitted to all facilities within larger geographic area on       Patient informed that his/her managed care company has contracts with or will negotiate with certain facilities, including the following:            Patient/family informed of bed offers received.  Patient chooses bed at       Physician recommends and patient chooses bed at      Patient to be transferred to   on  .  Patient to be transferred to facility by       Patient family notified on   of transfer.  Name of family member notified:        PHYSICIAN       Additional Comment:    _______________________________________________ Dede Query, LCSW 04/25/2016, 3:57 PM

## 2016-04-25 NOTE — Progress Notes (Signed)
[facility]  ROUNDING NOTE   Subjective:  Renal function continues to improve. Cr down to 1.6. Good UOP noted.  Patient noted to be tachycardic.   Objective:  Vital signs in last 24 hours:  Temp:  [98.4 F (36.9 C)-101.1 F (38.4 C)] 100 F (37.8 C) (05/01 0800) Pulse Rate:  [83-127] 124 (05/01 0800) Resp:  [18-28] 27 (05/01 0800) BP: (73-111)/(44-67) 111/51 mmHg (05/01 0800) SpO2:  [92 %-99 %] 92 % (05/01 0800) Weight:  [122.3 kg (269 lb 10 oz)] 122.3 kg (269 lb 10 oz) (05/01 0600)  Weight change: 7.6 kg (16 lb 12.1 oz) Filed Weights   04/23/16 2011 04/24/16 0500 04/25/16 0600  Weight: 117.7 kg (259 lb 7.7 oz) 117.7 kg (259 lb 7.7 oz) 122.3 kg (269 lb 10 oz)    Intake/Output: I/O last 3 completed shifts: In: 5990.4 [P.O.:120; I.V.:4937.1; IV Piggyback:933.3] Out: 2160 [Urine:2160]   Intake/Output this shift:  Total I/O In: -  Out: 250 [Urine:250]  Physical Exam: General: NAD, sitting up in bed  Head: Normocephalic, atraumatic. Moist oral mucosal membranes  Eyes: Anicteric  Neck: Supple, trachea midline  Lungs:  Clear to auscultation  Heart: S1S2 tachycardic  Abdomen:  Soft, nontender, BS present  Extremities: no peripheral edema.  Neurologic: Nonfocal, moving all four extremities  Skin: No lesions       Basic Metabolic Panel:  Recent Labs Lab 04/24/16 0842 04/24/16 1255 04/24/16 1823 04/24/16 2322 04/25/16 0600  NA 130* 131* 130* 131* 131*  K 3.6 3.9 3.6 3.0* 3.3*  CL 101 101 97* 95* 93*  CO2 18* 17* 21* 23 25  GLUCOSE 144* 177* 217* 131* 282*  BUN 82* 85* 80* 71* 67*  CREATININE 2.42* 2.34* 2.18* 1.83* 1.64*  CALCIUM 8.0* 7.9* 7.6* 7.6* 7.3*    Liver Function Tests:  Recent Labs Lab 04/23/16 1641  AST 90*  ALT 45  ALKPHOS 61  BILITOT 0.9  PROT 7.4  ALBUMIN 2.7*   No results for input(s): LIPASE, AMYLASE in the last 168 hours. No results for input(s): AMMONIA in the last 168 hours.  CBC:  Recent Labs Lab  04/23/16 1641 04/23/16 2020 04/24/16 0439 04/25/16 0600  WBC 9.6 6.5 5.3 5.7  NEUTROABS 8.8*  --   --   --   HGB 10.6* 8.4* 10.1* 9.6*  HCT 32.7* 25.3* 30.0* 28.5*  MCV 86.1 84.0 82.9 81.4  PLT 203 152 168 134*    Cardiac Enzymes:  Recent Labs Lab 04/23/16 1641 04/23/16 1642 04/23/16 2020 04/24/16 0730 04/25/16 0600  CKTOTAL  --  5665* 3670* 2934* 1395*  TROPONINI 0.03  --   --   --   --     BNP: Invalid input(s): POCBNP  CBG:  Recent Labs Lab 04/24/16 2127 04/24/16 2220 04/24/16 2340 04/25/16 0025 04/25/16 0755  GLUCAP 163* 146* 140* 113* 332*    Microbiology: Results for orders placed or performed during the hospital encounter of 04/23/16  Blood culture (routine x 2)     Status: Abnormal (Preliminary result)   Collection Time: 04/23/16  4:35 PM  Result Value Ref Range Status   Specimen Description BLOOD RIGHT ANTECUBITAL  Final   Special Requests   Final    BOTTLES DRAWN AEROBIC AND ANAEROBIC  AER 7CC ANA 1CC   Culture  Setup Time   Final    GRAM NEGATIVE RODS IN BOTH AEROBIC AND ANAEROBIC BOTTLES CRITICAL RESULT CALLED TO, READ BACK BY AND VERIFIED WITH: Morton Stall @ [phone] 04/24/16 by [facility]  Culture (A)  Final    KLEBSIELLA PNEUMONIAE IN BOTH AEROBIC AND ANAEROBIC BOTTLES SUSCEPTIBILITIES TO FOLLOW ONCE BETTER GROWTH    Report Status PENDING  Incomplete  Blood Culture ID Panel (Reflexed)     Status: Abnormal   Collection Time: 04/23/16  4:35 PM  Result Value Ref Range Status   Enterococcus species NOT DETECTED NOT DETECTED Final   Vancomycin resistance NOT DETECTED NOT DETECTED Final   Listeria monocytogenes NOT DETECTED NOT DETECTED Final   Staphylococcus species NOT DETECTED NOT DETECTED Final   Staphylococcus aureus NOT DETECTED NOT DETECTED Final   Methicillin resistance NOT DETECTED NOT DETECTED Final   Streptococcus species NOT DETECTED NOT DETECTED Final   Streptococcus agalactiae NOT DETECTED NOT DETECTED Final   Streptococcus  pneumoniae NOT DETECTED NOT DETECTED Final   Streptococcus pyogenes NOT DETECTED NOT DETECTED Final   Acinetobacter baumannii NOT DETECTED NOT DETECTED Final   Enterobacteriaceae species DETECTED (A) NOT DETECTED Final    Comment: CRITICAL RESULT CALLED TO, READ BACK BY AND VERIFIED WITH: Nate Cookson @ [phone] 04/24/16 by [facility]    Enterobacter cloacae complex NOT DETECTED NOT DETECTED Final   Escherichia coli NOT DETECTED NOT DETECTED Final   Klebsiella oxytoca NOT DETECTED NOT DETECTED Final   Klebsiella pneumoniae DETECTED (A) NOT DETECTED Final    Comment: CRITICAL RESULT CALLED TO, READ BACK BY AND VERIFIED WITH: Nate Cookson @ [phone] 04/24/16 by [facility]    Proteus species NOT DETECTED NOT DETECTED Final   Serratia marcescens NOT DETECTED NOT DETECTED Final   Carbapenem resistance NOT DETECTED NOT DETECTED Final   Haemophilus influenzae NOT DETECTED NOT DETECTED Final   Neisseria meningitidis NOT DETECTED NOT DETECTED Final   Pseudomonas aeruginosa NOT DETECTED NOT DETECTED Final   Candida albicans NOT DETECTED NOT DETECTED Final   Candida glabrata NOT DETECTED NOT DETECTED Final   Candida krusei NOT DETECTED NOT DETECTED Final   Candida parapsilosis NOT DETECTED NOT DETECTED Final   Candida tropicalis NOT DETECTED NOT DETECTED Final  Blood culture (routine x 2)     Status: None (Preliminary result)   Collection Time: 04/23/16  4:41 PM  Result Value Ref Range Status   Specimen Description BLOOD RIGHT ARM  Final   Special Requests   Final    BOTTLES DRAWN AEROBIC AND ANAEROBIC  AER 6CC ANA 2CC   Culture  Setup Time   Final    GRAM NEGATIVE RODS IN BOTH AEROBIC AND ANAEROBIC BOTTLES CRITICAL VALUE NOTED.  VALUE IS CONSISTENT WITH PREVIOUSLY REPORTED AND CALLED VALUE.    Culture   Final    GRAM NEGATIVE RODS IN BOTH AEROBIC AND ANAEROBIC BOTTLES IDENTIFICATION AND SUSCEPTIBILITIES TO FOLLOW ONCE BETTER GROWTH    Report Status PENDING  Incomplete  Urine culture     Status: Abnormal  (Preliminary result)   Collection Time: 04/23/16  4:41 PM  Result Value Ref Range Status   Specimen Description URINE, CLEAN CATCH  Final   Special Requests NONE  Final   Culture (A)  Final    >=100,000 COLONIES/mL GRAM NEGATIVE RODS IDENTIFICATION AND SUSCEPTIBILITIES TO FOLLOW    Report Status PENDING  Incomplete  MRSA PCR Screening     Status: Abnormal   Collection Time: 04/23/16  9:06 PM  Result Value Ref Range Status   MRSA by PCR POSITIVE (A) NEGATIVE Final    Comment:        The GeneXpert MRSA Assay (FDA approved for NASAL specimens only), is one component of a comprehensive MRSA  colonization surveillance program. It is not intended to diagnose MRSA infection nor to guide or monitor treatment for MRSA infections. CRITICAL RESULT CALLED TO, READ BACK BY AND VERIFIED WITH: LESLIE LEWIS AT 2312 04/23/16.PMH     Coagulation Studies: No results for input(s): LABPROT, INR in the last 72 hours.  Urinalysis:  Recent Labs  04/23/16 1641  COLORURINE YELLOW*  LABSPEC 1.016  PHURINE 5.0  GLUCOSEU >500*  HGBUR 3+*  BILIRUBINUR NEGATIVE  KETONESUR TRACE*  PROTEINUR 30*  NITRITE NEGATIVE  LEUKOCYTESUR 2+*      Imaging: US Renal  04/24/2016  CLINICAL DATA:  Acute renal failure EXAM: RENAL / URINARY TRACT ULTRASOUND COMPLETE COMPARISON:  None. FINDINGS: Right Kidney: Length: 11.7 cm. Echogenicity within normal limits. No mass or hydronephrosis visualized. Left Kidney: Length: 11.0 cm. Echogenicity within normal limits. No mass or hydronephrosis visualized. Bladder: Bladder not evaluated as it is decompressed by Foley catheter. IMPRESSION: Kidneys are normal. Electronically Signed   By: Esperanza Heir M.D.   On: 04/24/2016 12:05   Dg Chest Portable 1 View  04/23/2016  CLINICAL DATA:  Post fall yesterday. History of diabetes, hypertension, asthma. EXAM: PORTABLE CHEST 1 VIEW COMPARISON:  None. FINDINGS: Heart size is upper normal. Lungs are clear. Lung volumes are normal.  No pleural effusion or pneumothorax seen. No osseous fracture or dislocation identified. IMPRESSION: No acute findings. Electronically Signed   By: Bary Richard M.D.   On: 04/23/2016 17:21     Medications:   . dextrose 5 % 1,000 mL with sodium acetate 150 mEq infusion 200 mL/hr at 04/25/16 0700  . dextrose 5 % and 0.45% NaCl Stopped (04/24/16 1900)  . norepinephrine Stopped (04/24/16 1103)   . cefTRIAXone (ROCEPHIN)  IV  2 g Intravenous Q24H  . Chlorhexidine Gluconate Cloth  6 each Topical Q0600  . docusate sodium  100 mg Oral BID  . heparin  5,000 Units Subcutaneous Q8H  . insulin aspart  0-9 Units Subcutaneous TID AC & HS  . insulin glargine  10 Units Subcutaneous Q24H  . mupirocin ointment  1 application Nasal BID  . nicotine  21 mg Transdermal Daily  . nystatin  5 mL Oral QID   acetaminophen **OR** acetaminophen, bisacodyl, chlorpheniramine-HYDROcodone, HYDROcodone-acetaminophen, ondansetron **OR** ondansetron (ZOFRAN) IV, traZODone  Assessment/ Plan:  55 y.o. female with a PMHX of diabetes, hypertension, arthritis, hyperlipidemia, asthma, was admitted on 04/23/2016 with generalized weakness, found on the floor status post fall.   1. ARF, CKD unspecified 2. Sepsis - Klebsiella in blood, GNR in urine 3. Rhabdomyolysis 4. Lactic acidosis 5. Hyponatremia 6. Diabetic Ketoacidosis at presentation  Plan:  Renal function continues to improve. Creatinine currently down to 1.6. Serum bicarbonate up to 25. Therefore we will discontinue sodium acetate in favor of 0.9 normal saline. We will also administer 40 mEq of potassium chloride today. Overall the patient appears to be improving. Continue to monitor electrolytes, urine output, and serum glucose.  CK has come down to 1395 as well.   LOS: 2 [name] 5/1/20178:55 AM

## 2016-04-25 NOTE — Progress Notes (Signed)
Inpatient Diabetes Program Recommendations  AACE/ADA: New Consensus Statement on Inpatient Glycemic Control (2015)  Target Ranges:  Prepandial:   less than 140 mg/dL      Peak postprandial:   less than 180 mg/dL (1-2 hours)      Critically ill patients:  140 - 180 mg/dL   Review of Glycemic Control:  Results for [name], [name] (MRN [id number]) as of 04/25/2016 09:42  Ref. Range 04/25/2016 07:55  Glucose-Capillary Latest Ref Range: 65-99 mg/dL 502 (H)  Results for [name], [name] (MRN [id number]) as of 04/25/2016 09:42  Ref. Range 04/24/2016 12:55  Hemoglobin A1C Latest Ref Range: 4.0-6.0 % 11.1 (H)    Diabetes history: Type 2 diabetes Outpatient Diabetes medications: Januvia 100 mg daily, Metformin XR 1000 mg bid, Actos 45 mg daily, Victoza 1.8 mg daily Current orders for Inpatient glycemic control:  Lantus 10 units daily, Novolog sensitive tid with meals and HS  Inpatient Diabetes Program Recommendations:   Please increase Lantus to 30 units daily.  Also please consider adding Novolog meal coverage 4 units tid with meals and increase Novolog correction to moderate.  Will follow.  Thanks, Beryl Meager, RN, BC-ADM Inpatient Diabetes Coordinator Pager [phone] (8a-5p)

## 2016-04-25 NOTE — Progress Notes (Signed)
Physical Therapy Treatment Patient Details Name: [name] MRN: [id number] DOB: [birth year] Today's Date: 04/25/2016    History of Present Illness [name] is a 55 y.o. female with a known history of diabetes mellitus type 2, hypertension, hyperlipidemia brought in by family for confusion, generalized weakness. Patient was admitted with hyperglycemia with blood sugar levels >600. She also has sepsis and rhabdomyolysis.     PT Comments    Pt agreeable to PT and eager to try and move more. Pt notes decreased sensation in Bilateral lower extremities, which is noted to be only in bilateral feet with light touch. Decreased strength in Bilateral lower extremities with supine exercises. Attempted sitting edge of bed, but unable due to elevating heart rate with attempt. Pt does require frequent rests to manage fluctuating heart rate with minimal exertion. Continue PT to progress strength and endurance with safe cardiac levels to progress to improved functional mobility. Continue to assess Bilateral lower extremity sensation.   Follow Up Recommendations  SNF     Equipment Recommendations  Rolling walker with 5" wheels (unsure, may need due to poor sensation in B feet/LE weakness)    Recommendations for Other Services       Precautions / Restrictions Restrictions Weight Bearing Restrictions: No    Mobility  Bed Mobility Overal bed mobility: Needs Assistance Bed Mobility: Supine to Sit (attempted)     Supine to sit: Max assist;HOB elevated (Mod A for Mod A for moving each LE)     General bed mobility comments: Attempted sitting edge of bed; increased time/effort moving each LE. When trunk elevation attempted HR increased to 134 bpm. Attempted rest period, but HR remain between 128 and 133 bpm. No further attempts for up in bed. Max x 2 for reposition upward in bed  Transfers                    Ambulation/Gait                 Stairs            Wheelchair  Mobility    Modified Rankin (Stroke Patients Only)       Balance                                    Cognition Arousal/Alertness: Awake/alert Behavior During Therapy: WFL for tasks assessed/performed Overall Cognitive Status: Within Functional Limits for tasks assessed                      Exercises General Exercises - Lower Extremity Ankle Circles/Pumps: AAROM;Both;20 reps;Supine (limited AROM bilaterally) Quad Sets: Strengthening;Both;10 reps;Supine Gluteal Sets: Strengthening;Both;10 reps;Supine Short Arc Quad: AROM;Both;10 reps;Supine Heel Slides: AAROM;Both;10 reps;Supine Hip ABduction/ADduction: AAROM;Both;10 reps;Supine Other Exercises Other Exercises: requires some rest periods between exercises to manage heart rate, as HR fluctuates between 112 with rest and 129 bpm with activity    General Comments        Pertinent Vitals/Pain Pain Assessment:  (various pains primarily in LLE with movement)    Home Living                      Prior Function            PT Goals (current goals can now be found in the care plan section) Progress towards PT goals: Progressing toward goals (slowly)    Frequency  Min 2X/week  PT Plan Current plan remains appropriate    Co-evaluation             End of Session   Activity Tolerance:  (limited by tachycardia, weakness and poor feeling in B feet) Patient left: in bed;with call bell/phone within reach;with SCD's reapplied     Time: 1530-1550 PT Time Calculation (min) (ACUTE ONLY): 20 min  Charges:  $Therapeutic Exercise: 8-22 mins                    G CodesKristeen Miss, PTA 04/25/2016, 4:03 PM

## 2016-04-25 NOTE — Progress Notes (Signed)
SNF and Non-Emergent EMS Transport Benefits:  Number called: [phone] Rep: Vivien Rota Reference Number: [id number]  [facility] Options plan active as of 05/27/15 and scheduled to term on 05/25/16.  Deductible is $5000, of which $1045.34 met so far.  Out of pocket max is $6600, of which $4348.85 has been met so far.    In-network SNF: Once deductible is satisfied, 30% co-insurance for services until the out of pocket is met.  Once out of pocket is met, covered at 100% up to the allowed amount for services.  Josem Kaufmann is required: [phone].  Non-emergent EMS transport: Once deductible is satisfied, 30% co-insurance for services until the out of pocket is met.  Josem Kaufmann is not required for transports using service codes [phone] or [phone].

## 2016-04-25 NOTE — NC FL2 (Deleted)
[place] MEDICAID FL2 LEVEL OF CARE SCREENING TOOL     IDENTIFICATION  Patient Name: [name] Birthdate: 05/26/1961 Sex: female Admission Date (Current Location): 04/23/2016  [place] and [place] Number:  Chiropodist and Address:  [facility], [address] [place], [place] [address]      Provider Number: [id number]  Attending Physician Name and Address:  Katharina Caper, MD  Relative Name and Phone Number:       Current Level of Care: Hospital Recommended Level of Care: Skilled Nursing Facility Prior Approval Number:    Date Approved/Denied:   PASRR Number: [id number]  Discharge Plan: SNF    Current Diagnoses: Patient Active Problem List   Diagnosis Date Noted  . Pressure ulcer 04/24/2016  . DKA (diabetic ketoacidosis) (HCC) 04/23/2016    Orientation RESPIRATION BLADDER Height & Weight     Self, Time, Situation, Place  Normal Indwelling catheter Weight: 269 lb 10 oz (122.3 kg) Height:  5\' 5"  (165.1 cm)  BEHAVIORAL SYMPTOMS/MOOD NEUROLOGICAL BOWEL NUTRITION STATUS      Continent Diet (Carb Modified, thin liquids)  AMBULATORY STATUS COMMUNICATION OF NEEDS Skin   Extensive Assist Verbally Normal                       Personal Care Assistance Level of Assistance  Bathing, Feeding, Dressing Bathing Assistance: Maximum assistance Feeding assistance: Independent Dressing Assistance: Maximum assistance     Functional Limitations Info  Sight, Hearing, Speech Sight Info: Adequate Hearing Info: Adequate Speech Info: Adequate    SPECIAL CARE FACTORS FREQUENCY  PT (By licensed PT)                    Contractures Contractures Info: Not present    Additional Factors Info  Code Status, Allergies, Insulin Sliding Scale Code Status Info: Full Code Allergies Info: Biaxin   Insulin Sliding Scale Info: 4x/day       Current Medications (04/25/2016):  This is the current hospital active medication list Current  Facility-Administered Medications  Medication Dose Route Frequency Provider Last Rate Last Dose  . 0.9 %  sodium chloride infusion   Intravenous Continuous Munsoor Lateef, MD 100 mL/hr at 04/25/16 1247    . acetaminophen (TYLENOL) tablet 650 mg  650 mg Oral Q6H PRN 06/25/16, MD   650 mg at 04/25/16 0033   Or  . acetaminophen (TYLENOL) suppository 650 mg  650 mg Rectal Q6H PRN 06/25/16, MD      . bisacodyl (DULCOLAX) EC tablet 5 mg  5 mg Oral Daily PRN Katha Hamming, MD      . cefTRIAXone (ROCEPHIN) 2 g in dextrose 5 % 50 mL IVPB  2 g Intravenous Q24H Katha Hamming, MD   2 g at 04/25/16 0631  . Chlorhexidine Gluconate Cloth 2 % PADS 6 each  6 each Topical Q0600 06/25/16, MD   6 each at 04/25/16 0631  . chlorpheniramine-HYDROcodone (TUSSIONEX) 10-8 MG/5ML suspension 5 mL  5 mL Oral Q12H PRN 06/25/16, MD   5 mL at 04/24/16 1453  . docusate sodium (COLACE) capsule 100 mg  100 mg Oral BID 04/26/16, MD   100 mg at 04/25/16 0911  . enoxaparin (LOVENOX) injection 40 mg  40 mg Subcutaneous Q12H 06/25/16, MD      . famotidine (PEPCID) IVPB 20 mg premix  20 mg Intravenous Q12H Erin Fulling, MD   20 mg at 04/25/16 1258  . HYDROcodone-acetaminophen (NORCO/VICODIN) 5-325  MG per tablet 1-2 tablet  1-2 tablet Oral Q4H PRN Katha Hamming, MD      . insulin aspart (novoLOG) injection 0-15 Units  0-15 Units Subcutaneous TID WC Katharina Caper, MD      . insulin aspart (novoLOG) injection 0-5 Units  0-5 Units Subcutaneous QHS Katharina Caper, MD      . insulin aspart (novoLOG) injection 4 Units  4 Units Subcutaneous TID WC Katharina Caper, MD      . insulin glargine (LANTUS) injection 30 Units  30 Units Subcutaneous Q24H Katharina Caper, MD      . mupirocin ointment (BACTROBAN) 2 % 1 application  1 application Nasal BID Katha Hamming, MD   1 application at 04/25/16 0911  . nicotine (NICODERM CQ - dosed in mg/24 hours) patch 21 mg  21 mg Transdermal Daily  Arnaldo Natal, MD   21 mg at 04/25/16 0911  . norepinephrine (LEVOPHED) 4mg  in D5W premix infusion  0-40 mcg/min Intravenous Titrated , RPH   Stopped at 04/24/16 1103  . nystatin (MYCOSTATIN) 100000 UNIT/ML suspension 500,000 Units  5 mL Oral QID 04/26/16, MD   500,000 Units at 04/25/16 0911  . ondansetron (ZOFRAN) tablet 4 mg  4 mg Oral Q6H PRN 06/25/16, MD       Or  . ondansetron (ZOFRAN) injection 4 mg  4 mg Intravenous Q6H PRN Katha Hamming, MD      . traZODone (DESYREL) tablet 25 mg  25 mg Oral QHS PRN Katha Hamming, MD         Discharge Medications: Please see discharge summary for a list of discharge medications.  Relevant Imaging Results:  Relevant Lab Results:   Additional Information SSN:  Katha Hamming  [id number], LCSW

## 2016-04-25 NOTE — Progress Notes (Signed)
[facility] - [place] at [facility]   PATIENT NAME: [name]    MR#:  [id number]  DATE OF BIRTH:  Nov 30, 1961  SUBJECTIVE:  CHIEF COMPLAINT:   Chief Complaint  Patient presents with  . Fall  . Hyperglycemia  Patient is 55 year old Caucasian female with past medical history significant for history of diabetes mellitus type 2, essential hypertension, hyperlipidemia who was brought to the hospital with confusion, generalized weakness, fever, nausea and vomiting, decreased urination, decreased oral intake. On arrival to the hospital patient was noted to be in DKA with elevated anion gap, blood glucose levels, hyponatremia, lactic acidosis, rhabdomyolysis. Urinalysis revealed pyuria. She was admitted to the hospital with sepsis, DKA, she was initiated on Rocephin intravenously. She remains febrile with temperature reaching 101 intermittently, blood pressure Is improved on IV fluids. Urine culture is growing Klebsiella pneumonia, blood cultures are pending, gram-negative rods. Kidney function is improving with creatinine of 1.64 and estimated GFR of 34 today, up from a GFR of 20, few days ago. He remains on bicarbonate substitute. Sodium level has improved. The patient's potassium level remains low due to alkalinization of blood. Patient complains of bilateral lower extremity weakness and numbness, especially left lower extremity.  No pain reported. Overall, patient feels better, stronger Review of Systems  Constitutional: Positive for fever, chills and malaise/fatigue. Negative for weight loss.  HENT: Negative for congestion.   Eyes: Negative for blurred vision and double vision.  Respiratory: Negative for cough, sputum production, shortness of breath and wheezing.   Cardiovascular: Negative for chest pain, palpitations, orthopnea, leg swelling and PND.  Gastrointestinal: Negative for nausea, vomiting, abdominal pain, diarrhea, constipation and blood in stool.   Genitourinary: Negative for dysuria, urgency, frequency and hematuria.  Musculoskeletal: Negative for falls.  Neurological: Positive for sensory change, focal weakness and weakness. Negative for dizziness, tremors and headaches.  Endo/Heme/Allergies: Does not bruise/bleed easily.  Psychiatric/Behavioral: Negative for depression. The patient does not have insomnia.     VITAL SIGNS: Blood pressure 111/51, pulse 124, temperature 100 F (37.8 C), temperature source Other (Comment), resp. rate 27, height 5\' 5"  (1.651 m), weight 122.3 kg (269 lb 10 oz), SpO2 92 %.  PHYSICAL EXAMINATION:   GENERAL:  55 y.o.-year-old patient lying in the bed with no acute distress.  More animated, cheeks are pink.  EYES: Pupils equal, round, reactive to light and accommodation. No scleral icterus. Extraocular muscles intact.  HEENT: Head atraumatic, normocephalic. Oropharynx and nasopharynx clear.  NECK:  Supple, no jugular venous distention. No thyroid enlargement, no tenderness.  LUNGS:  Some diminished breath sounds bilaterally, no wheezing, rales,rhonchi or crepitation, poor effort . No use of accessory muscles of respiration.  CARDIOVASCULAR: S1, S2 normal. No murmurs, rubs, or gallops.  Tachycardic ABDOMEN: Soft,  minimally uncomfortable in suprapubic area, nondistended. Bowel sounds present. No organomegaly or mass.  Comes urine in Foley catheter is light yellow color, cloudy EXTREMITIES: No pedal edema, cyanosis, or clubbing.  NEUROLOGIC: Cranial nerves II through XII are intact. Muscle strength 1/5 in both lower extremities, better in the right lower extremity, withdraws from painful stimuli. Sensation to light touch is decreased in both legs.  Gait not checked.  PSYCHIATRIC: The patient is alert and oriented x 3.  SKIN: No obvious rash, lesion, or ulcer.   ORDERS/RESULTS REVIEWED:   CBC  Recent Labs Lab 04/23/16 1641 04/23/16 2020 04/24/16 0439 04/25/16 0600  WBC 9.6 6.5 5.3 5.7  HGB 10.6* 8.4*  10.1* 9.6*  HCT 32.7* 25.3* 30.0* 28.5*  PLT 203 152 168 134*  MCV 86.1 84.0 82.9 81.4  MCH 27.9 27.8 27.8 27.5  MCHC 32.4 33.1 33.6 33.7  RDW 16.0* 15.9* 15.8* 15.7*  LYMPHSABS 0.5*  --   --   --   MONOABS 0.3  --   --   --   EOSABS 0.0  --   --   --   BASOSABS 0.0  --   --   --    ------------------------------------------------------------------------------------------------------------------  Chemistries   Recent Labs Lab 04/23/16 1641  04/24/16 0842 04/24/16 1255 04/24/16 1823 04/24/16 2322 04/25/16 0600  NA 117*  < > 130* 131* 130* 131* 131*  K 5.2*  < > 3.6 3.9 3.6 3.0* 3.3*  CL 82*  < > 101 101 97* 95* 93*  CO2 18*  < > 18* 17* 21* 23 25  GLUCOSE 788*  < > 144* 177* 217* 131* 282*  BUN 99*  < > 82* 85* 80* 71* 67*  CREATININE 3.10*  < > 2.42* 2.34* 2.18* 1.83* 1.64*  CALCIUM 8.5*  < > 8.0* 7.9* 7.6* 7.6* 7.3*  AST 90*  --   --   --   --   --   --   ALT 45  --   --   --   --   --   --   ALKPHOS 61  --   --   --   --   --   --   BILITOT 0.9  --   --   --   --   --   --   < > = values in this interval not displayed. ------------------------------------------------------------------------------------------------------------------ estimated creatinine clearance is 51.4 mL/min (by C-G formula based on Cr of 1.64). ------------------------------------------------------------------------------------------------------------------ No results for input(s): TSH, T4TOTAL, T3FREE, THYROIDAB in the last 72 hours.  Invalid input(s): FREET3  Cardiac Enzymes  Recent Labs Lab 04/23/16 1641  TROPONINI 0.03   ------------------------------------------------------------------------------------------------------------------ Invalid input(s): POCBNP ---------------------------------------------------------------------------------------------------------------  RADIOLOGY: US Renal  04/24/2016  CLINICAL DATA:  Acute renal failure EXAM: RENAL / URINARY TRACT ULTRASOUND COMPLETE  COMPARISON:  None. FINDINGS: Right Kidney: Length: 11.7 cm. Echogenicity within normal limits. No mass or hydronephrosis visualized. Left Kidney: Length: 11.0 cm. Echogenicity within normal limits. No mass or hydronephrosis visualized. Bladder: Bladder not evaluated as it is decompressed by Foley catheter. IMPRESSION: Kidneys are normal. Electronically Signed   By: Esperanza Heir M.D.   On: 04/24/2016 12:05   Dg Chest Portable 1 View  04/23/2016  CLINICAL DATA:  Post fall yesterday. History of diabetes, hypertension, asthma. EXAM: PORTABLE CHEST 1 VIEW COMPARISON:  None. FINDINGS: Heart size is upper normal. Lungs are clear. Lung volumes are normal. No pleural effusion or pneumothorax seen. No osseous fracture or dislocation identified. IMPRESSION: No acute findings. Electronically Signed   By: Bary Richard M.D.   On: 04/23/2016 17:21    EKG:  Orders placed or performed during the hospital encounter of 04/23/16  . ED EKG  . ED EKG    ASSESSMENT AND PLAN:  Active Problems:   DKA (diabetic ketoacidosis) (HCC)   Pressure ulcer #1. Sepsis due to Klebsiella pneumonia, due to acute pyelonephritis, Urine culture revealed Klebsiella pneumonia, blood cultures showed gram-negative rods. Identification is pending.  Continue Rocephin intravenously as based per sensitivity results. Blood pressure has improved, and patient does not require Levophed at present . Urinary output was above 1 L over the past 24 hours.   #2. Septic shock, continue IV fluids at high rate, as needed Levophed  keeping MAP at and above 65 #3. Acute renal failure likely due to ATN, continue IV fluids. Improving kidney function, follow closely, nephrology consultation is appreciated, renal ultrasound was unremarkable, continue Foley catheter following in's and outs closely. Urine is less clouded today than it was yesterday. Discussed with nephrologist Dr. Cherylann Ratel today #4 DKA due to Poor diabetes control at home with hemoglobin A1c 11.1,  continue insulin Lantus, advance as needed, Blood glucose levels ranging between 113-332 #5. Hyponatremia,Initially improved with IV fluid administration, , now it's stable. Due to dextrose solution, recheck sodium level in the morning, continue IV fluidst a high rate of 200 cc an hour #6. Hyperkalemia, resolved with IV fluids, insulin therapy, our hypokalemic, supplementing orally,1 dose intravenously, follow in the morning #7. Rhabdomyolysis, patient received acetate solution drip, improved CK levels today, kidney function has improved, urinary output is good #8. Lactic acidosis, due to hypoperfusion, continue IV fluids at high rate, pressors as needed #9 Acute pyelonephritis, unremarkable renal ultrasound, clinically improved #10. Generalized weakness, involving physical therapist #11. Paraplegia, questionable due to hypotension related to sepsis, getting neurologist involved who ordered MRI of lumbar and thoracic spine, into physical therapy as ordered   Management plans discussed with the patient, family and they are in agreement.   DRUG ALLERGIES:  Allergies  Allergen Reactions  . Biaxin [Clarithromycin] Rash    Patient states this medication gives her severe rash and thrush in the mouth.    CODE STATUS:     Code Status Orders        Start     Ordered   04/23/16 1833  Full code   Continuous     04/23/16 1834    Code Status History    Date Active Date Inactive Code Status Order ID Comments User Context   This patient has a current code status but no historical code status.      TOTALCritical care TIME TAKING CARE OF THIS PATIENT: 50 minutes.    Katharina Caper M.D on 04/25/2016 at 11:14 AM  Between 7am to 6pm - Pager - [phone]  After 6pm go to [email or web address] - password [email or web address] [place] [facility]  Office  [phone]  CC: Primary care physician; Dortha Kern, MD

## 2016-04-25 NOTE — Clinical Documentation Improvement (Signed)
Hospitalist  Can the diagnosis of pressure ulcer be further specified?    Document Site with laterality - Elbow, Back (upper/lower), Sacral, Hip, Buttock, Ankle, Heel, Head, Other (Specify)  Pressure Ulcer Stage - Stage1, Stage 2, Stage 3, Stage 4, Unstageable, Unspecified, Unable to Clinically Determine  Other  Clinically Undetermined    Supporting Information: Wound nurse documents pressure ulcer of sacrum, not staged.   Please exercise your independent, professional judgment when responding. A specific answer is not anticipated or expected.   Thank Modesta Messing [facility] Health Information Management [place] [phone]

## 2016-04-25 NOTE — Progress Notes (Signed)
Per MD Winona Legato if metal wire in Foley prohibits MRI, replace with regular foley

## 2016-04-25 NOTE — Consult Note (Signed)
WOC wound consult note Reason for Consult:Deep tissue injury to sacrum, ischium and bilateral posterior upper thighs from prolonged period of time on toilet where patient was unable to get herself up. Patient is able to turn herself in bed for assessment today.  Wound type:Deep tissue injury, present on admission.  Pressure Ulcer POA: Yes Measurement:Right posterior upper thigh 2 cm x 2 cm blistering, peeling epithelium with 20 cm x 10 cm maroon discoloration present.  Left posterior upper thigh blistering 2 cm x 1 cm x 0.1 cm blistering, peeling epithelium with 15cm x 5 cm maroon discoloration  Wound TJQ:ZESP and moist Drainage (amount, consistency, odor) Minimal serosanguinous.   Periwound:Maroon nonblanchable discoloration, consistent with deep tissue injury.  Explained nature of this full thickness injury to patient and that deep damage was present under this intact skin.  That treatment would be preventing further damage, but the evolution of the wound already present may include deep, open nonintact wounds.  Patient verbalizes understanding and agrees to turn and reposition every two hours and ask for assistance as needed.  Dressing procedure/placement/frequency:Cleanse deep tissue injury to posterior upper thighs, buttocks and sacral wounds with soap and water.  Apply silicone foam dressing.  Change every other day. Notify wound care if wounds develop into nonintact, deep lesions for topical treatment.  Turn and reposition every two hours.   Will not follow at this time.  Please re-consult if needed.  Maple Hudson RN BSN CWON Pager [phone]

## 2016-04-26 DIAGNOSIS — R29898 Other symptoms and signs involving the musculoskeletal system: Secondary | ICD-10-CM

## 2016-04-26 LAB — CBC
HEMATOCRIT: 27.8 % — AB (ref 35.0–47.0)
HEMOGLOBIN: 9.6 g/dL — AB (ref 12.0–16.0)
MCH: 28.3 pg (ref 26.0–34.0)
MCHC: 34.3 g/dL (ref 32.0–36.0)
MCV: 82.3 fL (ref 80.0–100.0)
Platelets: 151 10*3/uL (ref 150–440)
RBC: 3.38 MIL/uL — ABNORMAL LOW (ref 3.80–5.20)
RDW: 15.9 % — AB (ref 11.5–14.5)
WBC: 6 10*3/uL (ref 3.6–11.0)

## 2016-04-26 LAB — BASIC METABOLIC PANEL
Anion gap: 8 (ref 5–15)
BUN: 43 mg/dL — AB (ref 6–20)
CHLORIDE: 97 mmol/L — AB (ref 101–111)
CO2: 27 mmol/L (ref 22–32)
Calcium: 7.5 mg/dL — ABNORMAL LOW (ref 8.9–10.3)
Creatinine, Ser: 1.09 mg/dL — ABNORMAL HIGH (ref 0.44–1.00)
GFR calc Af Amer: >60 mL/min (ref >60)
GFR, EST NON AFRICAN AMERICAN: 56 mL/min — AB (ref >60)
GLUCOSE: 178 mg/dL — AB (ref 65–99)
POTASSIUM: 3.9 mmol/L (ref 3.5–5.1)
Sodium: 132 mmol/L — ABNORMAL LOW (ref 135–145)

## 2016-04-26 LAB — CULTURE, BLOOD (ROUTINE X 2)

## 2016-04-26 LAB — INFLUENZA PANEL BY PCR (TYPE A & B)
H1N1FLUPCR: NOT DETECTED
INFLBPCR: NEGATIVE
Influenza A By PCR: NEGATIVE

## 2016-04-26 LAB — CK: Total CK: 563 U/L — ABNORMAL HIGH (ref 38–234)

## 2016-04-26 LAB — GLUCOSE, CAPILLARY
GLUCOSE-CAPILLARY: 248 mg/dL — AB (ref 65–99)
GLUCOSE-CAPILLARY: 219 mg/dL — AB (ref 65–99)
GLUCOSE-CAPILLARY: 241 mg/dL — AB (ref 65–99)
Glucose-Capillary: 214 mg/dL — ABNORMAL HIGH (ref 65–99)
Glucose-Capillary: 169 mg/dL — ABNORMAL HIGH (ref 65–99)
Glucose-Capillary: 210 mg/dL — ABNORMAL HIGH (ref 65–99)

## 2016-04-26 LAB — MAGNESIUM: Magnesium: 1.8 mg/dL (ref 1.7–2.4)

## 2016-04-26 MED ORDER — INSULIN GLARGINE 100 UNIT/ML ~~LOC~~ SOLN: 40.0000 [IU] | SUBCUTANEOUS | Status: DC

## 2016-04-26 MED ORDER — INSULIN GLARGINE 100 UNIT/ML ~~LOC~~ SOLN
36.0000 [IU] | SUBCUTANEOUS | Status: DC
  Administered 2016-04-26: 36 [IU] via SUBCUTANEOUS
  Filled 2016-04-26 (×2): qty 0.36

## 2016-04-26 NOTE — Progress Notes (Addendum)
Subjective: Patient reports that she is better today but still far from baseline.  She has less numbness and more strength.    Objective: Current vital signs: BP 105/63 mmHg  Pulse 113  Temp(Src) 99.1 F (37.3 C) (Oral)  Resp 25  Ht 5\' 5"  (1.651 m)  Wt 122.5 kg (270 lb 1 oz)  BMI 44.94 kg/m2  SpO2 91% Vital signs in last 24 hours: Temp:  [97.8 F (36.6 C)-101.1 F (38.4 C)] 99.1 F (37.3 C) (05/02 0800) Pulse Rate:  [108-121] 113 (05/02 0800) Resp:  [18-28] 25 (05/02 0800) BP: (91-121)/(59-70) 105/63 mmHg (05/02 0800) SpO2:  [90 %-99 %] 91 % (05/02 0800) Weight:  [122.5 kg (270 lb 1 oz)] 122.5 kg (270 lb 1 oz) (05/02 0618)  Intake/Output from previous day: 05/01 0701 - 05/02 0700 In: 3621.7 [I.V.:2321.7; IV Piggyback:1050] Out: 1400 [Urine:1400] Intake/Output this shift: Total I/O In: 100 [I.V.:100] Out: -  Nutritional status: Diet Carb Modified Fluid consistency:: Thin; Room service appropriate?: Yes  Neurologic Exam: Mental Status: Alert, oriented, thought content appropriate. Speech fluent without evidence of aphasia. Able to follow 3 step commands without difficulty. Cranial Nerves: II: Discs flat bilaterally; Visual fields grossly normal, pupils equal, round, reactive to light and accommodation III,IV, VI: ptosis not present, extra-ocular motions intact bilaterally V,VII: smile symmetric, facial light touch sensation normal bilaterally VIII: hearing normal bilaterally IX,X: gag reflex present XI: bilateral shoulder shrug XII: midline tongue extension Motor: Right :Upper extremity 5/5Left: Upper extremity 5/5 Patient can lift each leg off the bed 3-4 inches.  Continues to be able to flex at the knee at about 3/5.  0/5 ankle flexion and extension  Sensory: Continued altered sensation below the knee but improved.  No longer altered above the knee on the left or into the abdomen.   Deep Tendon Reflexes: 1+ at the  knees and absent at the ankles.  Plantars: Right: muteLeft: mute   Lab Results: Basic Metabolic Panel:  Recent Labs Lab 04/24/16 1255 04/24/16 1823 04/24/16 2322 04/25/16 0600 04/26/16 0538  NA 131* 130* 131* 131* 132*  K 3.9 3.6 3.0* 3.3* 3.9  CL 101 97* 95* 93* 97*  CO2 17* 21* 23 25 27   GLUCOSE 177* 217* 131* 282* 178*  BUN 85* 80* 71* 67* 43*  CREATININE 2.34* 2.18* 1.83* 1.64* 1.09*  CALCIUM 7.9* 7.6* 7.6* 7.3* 7.5*  MG  --   --   --   --  1.8    Liver Function Tests:  Recent Labs Lab 04/23/16 1641  AST 90*  ALT 45  ALKPHOS 61  BILITOT 0.9  PROT 7.4  ALBUMIN 2.7*   No results for input(s): LIPASE, AMYLASE in the last 168 hours. No results for input(s): AMMONIA in the last 168 hours.  CBC:  Recent Labs Lab 04/23/16 1641 04/23/16 2020 04/24/16 0439 04/25/16 0600 04/26/16 0538  WBC 9.6 6.5 5.3 5.7 6.0  NEUTROABS 8.8*  --   --   --   --   HGB 10.6* 8.4* 10.1* 9.6* 9.6*  HCT 32.7* 25.3* 30.0* 28.5* 27.8*  MCV 86.1 84.0 82.9 81.4 82.3  PLT 203 152 168 134* 151    Cardiac Enzymes:  Recent Labs Lab 04/23/16 1641 04/23/16 1642 04/23/16 2020 04/24/16 0730 04/25/16 0600 04/26/16 0538  CKTOTAL  --  5665* 3670* 2934* 1395* 563*  TROPONINI 0.03  --   --   --   --   --     Lipid Panel: No results for input(s): CHOL, TRIG, HDL,  CHOLHDL, VLDL, LDLCALC in the last 168 hours.  CBG:  Recent Labs Lab 04/25/16 0755 04/25/16 1209 04/25/16 1635 04/25/16 2149 04/26/16 0725  GLUCAP 332* 300* 265* 214* 169*    Microbiology: Results for orders placed or performed during the hospital encounter of 04/23/16  Blood culture (routine x 2)     Status: Abnormal   Collection Time: 04/23/16  4:35 PM  Result Value Ref Range Status   Specimen Description BLOOD RIGHT ANTECUBITAL  Final   Special Requests   Final    BOTTLES DRAWN AEROBIC AND ANAEROBIC  AER 7CC ANA 1CC   Culture  Setup Time   Final    GRAM NEGATIVE RODS IN BOTH  AEROBIC AND ANAEROBIC BOTTLES CRITICAL RESULT CALLED TO, READ BACK BY AND VERIFIED WITH: Morton Stall @ [phone] 04/24/16 by [facility]    Culture (A)  Final    KLEBSIELLA PNEUMONIAE IN BOTH AEROBIC AND ANAEROBIC BOTTLES    Report Status 04/26/2016 FINAL  Final   Organism ID, Bacteria KLEBSIELLA PNEUMONIAE  Final      Susceptibility   Klebsiella pneumoniae - MIC*    AMPICILLIN Value in next row Resistant      RESISTANT>=32    CEFAZOLIN Value in next row Sensitive      SENSITIVE<=4    CEFEPIME Value in next row Sensitive      SENSITIVE<=1    CEFTAZIDIME Value in next row Sensitive      SENSITIVE<=1    CEFTRIAXONE Value in next row Sensitive      SENSITIVE<=1    CIPROFLOXACIN Value in next row Sensitive      SENSITIVE<=0.25    GENTAMICIN Value in next row Sensitive      SENSITIVE<=1    IMIPENEM Value in next row Sensitive      SENSITIVE<=0.25    TRIMETH/SULFA Value in next row Sensitive      SENSITIVE<=20    AMPICILLIN/SULBACTAM Value in next row Sensitive      SENSITIVE8    PIP/TAZO Value in next row Sensitive      SENSITIVE<=4    Extended ESBL Value in next row Sensitive      SENSITIVE<=4    * KLEBSIELLA PNEUMONIAE  Blood Culture ID Panel (Reflexed)     Status: Abnormal   Collection Time: 04/23/16  4:35 PM  Result Value Ref Range Status   Enterococcus species NOT DETECTED NOT DETECTED Final   Vancomycin resistance NOT DETECTED NOT DETECTED Final   Listeria monocytogenes NOT DETECTED NOT DETECTED Final   Staphylococcus species NOT DETECTED NOT DETECTED Final   Staphylococcus aureus NOT DETECTED NOT DETECTED Final   Methicillin resistance NOT DETECTED NOT DETECTED Final   Streptococcus species NOT DETECTED NOT DETECTED Final   Streptococcus agalactiae NOT DETECTED NOT DETECTED Final   Streptococcus pneumoniae NOT DETECTED NOT DETECTED Final   Streptococcus pyogenes NOT DETECTED NOT DETECTED Final   Acinetobacter baumannii NOT DETECTED NOT DETECTED Final   Enterobacteriaceae  species DETECTED (A) NOT DETECTED Final    Comment: CRITICAL RESULT CALLED TO, READ BACK BY AND VERIFIED WITH: Nate Cookson @ [phone] 04/24/16 by [facility]    Enterobacter cloacae complex NOT DETECTED NOT DETECTED Final   Escherichia coli NOT DETECTED NOT DETECTED Final   Klebsiella oxytoca NOT DETECTED NOT DETECTED Final   Klebsiella pneumoniae DETECTED (A) NOT DETECTED Final    Comment: CRITICAL RESULT CALLED TO, READ BACK BY AND VERIFIED WITH: Nate Cookson @ [phone] 04/24/16 by [facility]    Proteus species NOT DETECTED NOT DETECTED  Final   Serratia marcescens NOT DETECTED NOT DETECTED Final   Carbapenem resistance NOT DETECTED NOT DETECTED Final   Haemophilus influenzae NOT DETECTED NOT DETECTED Final   Neisseria meningitidis NOT DETECTED NOT DETECTED Final   Pseudomonas aeruginosa NOT DETECTED NOT DETECTED Final   Candida albicans NOT DETECTED NOT DETECTED Final   Candida glabrata NOT DETECTED NOT DETECTED Final   Candida krusei NOT DETECTED NOT DETECTED Final   Candida parapsilosis NOT DETECTED NOT DETECTED Final   Candida tropicalis NOT DETECTED NOT DETECTED Final  Blood culture (routine x 2)     Status: Abnormal   Collection Time: 04/23/16  4:41 PM  Result Value Ref Range Status   Specimen Description BLOOD RIGHT ARM  Final   Special Requests   Final    BOTTLES DRAWN AEROBIC AND ANAEROBIC  AER 6CC ANA 2CC   Culture  Setup Time   Final    GRAM NEGATIVE RODS IN BOTH AEROBIC AND ANAEROBIC BOTTLES CRITICAL VALUE NOTED.  VALUE IS CONSISTENT WITH PREVIOUSLY REPORTED AND CALLED VALUE.    Culture (A)  Final    KLEBSIELLA PNEUMONIAE IN BOTH AEROBIC AND ANAEROBIC BOTTLES    Report Status 04/26/2016 FINAL  Final   Organism ID, Bacteria KLEBSIELLA PNEUMONIAE  Final      Susceptibility   Klebsiella pneumoniae - MIC*    AMPICILLIN >=32 RESISTANT Resistant     CEFAZOLIN <=4 SENSITIVE Sensitive     CEFEPIME <=1 SENSITIVE Sensitive     CEFTAZIDIME <=1 SENSITIVE Sensitive     CEFTRIAXONE <=1 SENSITIVE  Sensitive     CIPROFLOXACIN <=0.25 SENSITIVE Sensitive     GENTAMICIN <=1 SENSITIVE Sensitive     IMIPENEM <=0.25 SENSITIVE Sensitive     TRIMETH/SULFA <=20 SENSITIVE Sensitive     AMPICILLIN/SULBACTAM 8 SENSITIVE Sensitive     PIP/TAZO <=4 SENSITIVE Sensitive     Extended ESBL NEGATIVE Sensitive     * KLEBSIELLA PNEUMONIAE  Urine culture     Status: Abnormal   Collection Time: 04/23/16  4:41 PM  Result Value Ref Range Status   Specimen Description URINE, CLEAN CATCH  Final   Special Requests NONE  Final   Culture >=100,000 COLONIES/mL KLEBSIELLA PNEUMONIAE (A)  Final   Report Status 04/25/2016 FINAL  Final   Organism ID, Bacteria KLEBSIELLA PNEUMONIAE (A)  Final      Susceptibility   Klebsiella pneumoniae - MIC*    AMPICILLIN >=32 RESISTANT Resistant     CEFAZOLIN <=4 SENSITIVE Sensitive     CEFTRIAXONE <=1 SENSITIVE Sensitive     CIPROFLOXACIN <=0.25 SENSITIVE Sensitive     GENTAMICIN <=1 SENSITIVE Sensitive     IMIPENEM <=0.25 SENSITIVE Sensitive     NITROFURANTOIN 32 SENSITIVE Sensitive     TRIMETH/SULFA <=20 SENSITIVE Sensitive     AMPICILLIN/SULBACTAM 8 SENSITIVE Sensitive     PIP/TAZO <=4 SENSITIVE Sensitive     Extended ESBL NEGATIVE Sensitive     * >=100,000 COLONIES/mL KLEBSIELLA PNEUMONIAE  MRSA PCR Screening     Status: Abnormal   Collection Time: 04/23/16  9:06 PM  Result Value Ref Range Status   MRSA by PCR POSITIVE (A) NEGATIVE Final    Comment:        The GeneXpert MRSA Assay (FDA approved for NASAL specimens only), is one component of a comprehensive MRSA colonization surveillance program. It is not intended to diagnose MRSA infection nor to guide or monitor treatment for MRSA infections. CRITICAL RESULT CALLED TO, READ BACK BY AND VERIFIED WITH: Kearstin Learn LEWIS AT  2312 04/23/16.PMH     Coagulation Studies: No results for input(s): LABPROT, INR in the last 72 hours.  Imaging: Mr Thoracic Spine Wo Contrast  04/25/2016  CLINICAL DATA:  Fall at home and  unable to get up. Lower extremity numbness and weakness since the fall. The patient was found to be an diabetic ketoacidosis and septic. EXAM: MRI THORACIC SPINE WITHOUT CONTRAST TECHNIQUE: Multiplanar, multisequence MR imaging of the thoracic spine was performed. No intravenous contrast was administered. COMPARISON:  MRI lumbar spine from the same day. FINDINGS: The study is mildly degraded by patient motion. Normal signal is present in the thoracic spinal cord to the lowest imaged level, L1, within normal limits. Mild endplate marrow changes are noted anteriorly at T9-10 and T11-12. More subtle findings are present anteriorly at T6-7, T7-8, and T8-9. Vertebral body heights and alignment are maintained. No central disc protrusion or foraminal stenosis is evident within the thoracic spine. IMPRESSION: 1. Mild anterior endplate degenerative changes T6-7 through T11-12. 2. Otherwise normal marrow signal and vertebral body heights. 3. Left renal cyst partially imaged. No significant thoracic adenopathy. Electronically Signed   By: Marin Roberts M.D.   On: 04/25/2016 15:11   Mr Lumbar Spine Wo Contrast  04/25/2016  CLINICAL DATA:  Bilateral lower extremity numbness and weakness after fall at home. The patient was found to be an diabetic ketoacidosis and aseptic. EXAM: MRI LUMBAR SPINE WITHOUT CONTRAST TECHNIQUE: Multiplanar, multisequence MR imaging of the lumbar spine was performed. No intravenous contrast was administered. COMPARISON:  MRI of the thoracic spine from the same day. FINDINGS: Normal signal is present in the conus medullaris which terminates at L1. Marrow signal, vertebral body heights, alignment are normal. A 3 cm cystic lesion is present medially within the left kidney. Enlarged left para-aortic lymph node measures up to 15 mm in short axis at the level of the left renal hilum. Other smaller rounded left periaortic lymph nodes are present as well. The other nodes are less than 1 cm in short  axis. L1-2:  Negative. L2-3:  Negative. L3-4: Mild facet hypertrophy is present bilaterally without significant stenosis. L4-5: Asymmetric right-sided ligamentum flavum thickening inch roots into the central canal posteriorly on the right. There is no significant stenosis. Slight disc bulging is noted. L5-S1:  Negative. A transitional S1 segment is present. A small disc is present at S1-2 without significant protrusion or stenosis. IMPRESSION: 1. Enlarged left para-aortic lymph node at the renal hilum. Cystic lesions are noted in the kidneys. The kidneys are incompletely imaged. Recommend CT of the abdomen and pelvis with contrast for further evaluation to exclude malignancy. 2. Asymmetric right-sided facet hypertrophy and ligamentum flavum thickening without focal stenosis. 3. Transitional anatomy at S1. Electronically Signed   By: Marin Roberts M.D.   On: 04/25/2016 15:07   US Renal  04/24/2016  CLINICAL DATA:  Acute renal failure EXAM: RENAL / URINARY TRACT ULTRASOUND COMPLETE COMPARISON:  None. FINDINGS: Right Kidney: Length: 11.7 cm. Echogenicity within normal limits. No mass or hydronephrosis visualized. Left Kidney: Length: 11.0 cm. Echogenicity within normal limits. No mass or hydronephrosis visualized. Bladder: Bladder not evaluated as it is decompressed by Foley catheter. IMPRESSION: Kidneys are normal. Electronically Signed   By: Esperanza Heir M.D.   On: 04/24/2016 12:05    Medications:  I have reviewed the patient's current medications. Scheduled: . cefTRIAXone (ROCEPHIN)  IV  2 g Intravenous Q24H  . Chlorhexidine Gluconate Cloth  6 each Topical Q0600  . docusate sodium  100 mg  Oral BID  . enoxaparin (LOVENOX) injection  40 mg Subcutaneous Q12H  . famotidine (PEPCID) IV  20 mg Intravenous Q12H  . insulin aspart  0-15 Units Subcutaneous TID WC  . insulin aspart  0-5 Units Subcutaneous QHS  . insulin aspart  4 Units Subcutaneous TID WC  . insulin glargine  30 Units Subcutaneous  Q24H  . mupirocin ointment  1 application Nasal BID  . nicotine  21 mg Transdermal Daily  . nystatin  5 mL Oral QID    Assessment/Plan: Patient improving.  MRI of the thoracic and lumbar spine personally reviewed and shows no pathology to explain neurological examination.  CK continues to improve although remains significantly elevated.  TSH, B12 unremarkable.  B1 , Vit D and RPR pending.  With improvement in symptoms, likely secondary to rhabdo.  Will continue to follow clinically at this time.     LOS: 3 days   Thana Farr, MD Neurology [phone] 04/26/2016  10:13 AM

## 2016-04-26 NOTE — Care Management (Addendum)
Transfer out of icu was postponed due to inability to decreased sensation in lower extremities.   MRI was negative for stroke.  Neurology consult completed.  Patient's heart rate tachy.  She has a room air sat of 91 at rest.  If she is able to discharge directly home, will need assessment of need for home 02.  Physical therapy has recommended snf and patient thus far is in agreement

## 2016-04-26 NOTE — Progress Notes (Signed)
[facility] - [place] at [facility]   PATIENT NAME: [name]    MR#:  [id number]  DATE OF BIRTH:  1961/01/09  SUBJECTIVE:  CHIEF COMPLAINT:   Chief Complaint  Patient presents with  . Fall  . Hyperglycemia  Patient is 55 year old Caucasian female with past medical history significant for history of diabetes mellitus type 2, essential hypertension, hyperlipidemia who was brought to the hospital with confusion, generalized weakness, fever, nausea and vomiting, decreased urination, decreased oral intake. On arrival to the hospital patient was noted to be in DKA with elevated anion gap, blood glucose levels, hyponatremia, lactic acidosis, rhabdomyolysis. Urinalysis revealed pyuria. She was admitted to the hospital with sepsis, DKA, she was initiated on Rocephin intravenously. She remains febrile with temperature reaching 101 intermittently despite antibiotic therapy, blood pressure Is improved on IV fluids. Urine culture is growing Klebsiella pneumonia, blood cultures also revealed Klebsiella pneumonia. Kidney function is improving with creatinine of 1.09 and estimated GFR of 56 today, up from a GFR of 20, few days ago. . Sodium level has improved. The patient's potassium level normalized. Patient complained of bilateral lower extremity weakness and numbness, especially left lower extremity, which seems to be improving. Patient underwent MRI of thoracic and lumbar spine which was unremarkable, neurologist saw patient in consultation and felt that it was rhabdomyolysis related.  No pain reported. Overall, patient feels better, stronger Review of Systems  Constitutional: Positive for fever, chills and malaise/fatigue. Negative for weight loss.  HENT: Negative for congestion.   Eyes: Negative for blurred vision and double vision.  Respiratory: Negative for cough, sputum production, shortness of breath and wheezing.   Cardiovascular: Negative for chest pain, palpitations,  orthopnea, leg swelling and PND.  Gastrointestinal: Negative for nausea, vomiting, abdominal pain, diarrhea, constipation and blood in stool.  Genitourinary: Negative for dysuria, urgency, frequency and hematuria.  Musculoskeletal: Negative for falls.  Neurological: Positive for sensory change, focal weakness and weakness. Negative for dizziness, tremors and headaches.  Endo/Heme/Allergies: Does not bruise/bleed easily.  Psychiatric/Behavioral: Negative for depression. The patient does not have insomnia.     VITAL SIGNS: Blood pressure 125/111, pulse 115, temperature 98.8 F (37.1 C), temperature source Axillary, resp. rate 20, height 5\' 5"  (1.651 m), weight 122.5 kg (270 lb 1 oz), SpO2 97 %.  PHYSICAL EXAMINATION:   GENERAL:  55 y.o.-year-old patient lying in the bed with no acute distress.  More animated, cheeks are pink.  EYES: Pupils equal, round, reactive to light and accommodation. No scleral icterus. Extraocular muscles intact.  HEENT: Head atraumatic, normocephalic. Oropharynx and nasopharynx clear.  NECK:  Supple, no jugular venous distention. No thyroid enlargement, no tenderness.  LUNGS:  Some diminished breath sounds bilaterally, no wheezing, rales,rhonchi or crepitation, poor effort . No use of accessory muscles of respiration.  CARDIOVASCULAR: S1, S2 normal. No murmurs, rubs, or gallops.  Tachycardic ABDOMEN: Soft,  minimally uncomfortable in suprapubic area, nondistended. Bowel sounds present. No organomegaly or mass.  Comes urine in Foley catheter is light yellow color, cloudy EXTREMITIES: No pedal edema, cyanosis, or clubbing.  NEUROLOGIC: Cranial nerves II through XII are intact. Muscle strength 3/5 in both lower extremities, better in the right lower extremity, withdraws from painful stimuli. Sensation to light touch is decreased in both legs, but better in the right lower extremity.  Gait not checked.  PSYCHIATRIC: The patient is alert and oriented x 3.  SKIN: No obvious  rash, lesion, or ulcer.   ORDERS/RESULTS REVIEWED:   CBC  Recent Labs  Lab 04/23/16 1641 04/23/16 2020 04/24/16 0439 04/25/16 0600 04/26/16 0538  WBC 9.6 6.5 5.3 5.7 6.0  HGB 10.6* 8.4* 10.1* 9.6* 9.6*  HCT 32.7* 25.3* 30.0* 28.5* 27.8*  PLT 203 152 168 134* 151  MCV 86.1 84.0 82.9 81.4 82.3  MCH 27.9 27.8 27.8 27.5 28.3  MCHC 32.4 33.1 33.6 33.7 34.3  RDW 16.0* 15.9* 15.8* 15.7* 15.9*  LYMPHSABS 0.5*  --   --   --   --   MONOABS 0.3  --   --   --   --   EOSABS 0.0  --   --   --   --   BASOSABS 0.0  --   --   --   --    ------------------------------------------------------------------------------------------------------------------  Chemistries   Recent Labs Lab 04/23/16 1641  04/24/16 1255 04/24/16 1823 04/24/16 2322 04/25/16 0600 04/26/16 0538  NA 117*  < > 131* 130* 131* 131* 132*  K 5.2*  < > 3.9 3.6 3.0* 3.3* 3.9  CL 82*  < > 101 97* 95* 93* 97*  CO2 18*  < > 17* 21* 23 25 27   GLUCOSE 788*  < > 177* 217* 131* 282* 178*  BUN 99*  < > 85* 80* 71* 67* 43*  CREATININE 3.10*  < > 2.34* 2.18* 1.83* 1.64* 1.09*  CALCIUM 8.5*  < > 7.9* 7.6* 7.6* 7.3* 7.5*  MG  --   --   --   --   --   --  1.8  AST 90*  --   --   --   --   --   --   ALT 45  --   --   --   --   --   --   ALKPHOS 61  --   --   --   --   --   --   BILITOT 0.9  --   --   --   --   --   --   < > = values in this interval not displayed. ------------------------------------------------------------------------------------------------------------------ estimated creatinine clearance is 77.5 mL/min (by C-G formula based on Cr of 1.09). ------------------------------------------------------------------------------------------------------------------  Recent Labs  04/25/16 1107  TSH 1.476    Cardiac Enzymes  Recent Labs Lab 04/23/16 1641  TROPONINI 0.03   ------------------------------------------------------------------------------------------------------------------ Invalid input(s):  POCBNP ---------------------------------------------------------------------------------------------------------------  RADIOLOGY: Mr Thoracic Spine Wo Contrast  04/25/2016  CLINICAL DATA:  Fall at home and unable to get up. Lower extremity numbness and weakness since the fall. The patient was found to be an diabetic ketoacidosis and septic. EXAM: MRI THORACIC SPINE WITHOUT CONTRAST TECHNIQUE: Multiplanar, multisequence MR imaging of the thoracic spine was performed. No intravenous contrast was administered. COMPARISON:  MRI lumbar spine from the same day. FINDINGS: The study is mildly degraded by patient motion. Normal signal is present in the thoracic spinal cord to the lowest imaged level, L1, within normal limits. Mild endplate marrow changes are noted anteriorly at T9-10 and T11-12. More subtle findings are present anteriorly at T6-7, T7-8, and T8-9. Vertebral body heights and alignment are maintained. No central disc protrusion or foraminal stenosis is evident within the thoracic spine. IMPRESSION: 1. Mild anterior endplate degenerative changes T6-7 through T11-12. 2. Otherwise normal marrow signal and vertebral body heights. 3. Left renal cyst partially imaged. No significant thoracic adenopathy. Electronically Signed   By: 06/25/2016 M.D.   On: 04/25/2016 15:11   Mr Lumbar Spine Wo Contrast  04/25/2016  CLINICAL DATA:  Bilateral lower extremity numbness  and weakness after fall at home. The patient was found to be an diabetic ketoacidosis and aseptic. EXAM: MRI LUMBAR SPINE WITHOUT CONTRAST TECHNIQUE: Multiplanar, multisequence MR imaging of the lumbar spine was performed. No intravenous contrast was administered. COMPARISON:  MRI of the thoracic spine from the same day. FINDINGS: Normal signal is present in the conus medullaris which terminates at L1. Marrow signal, vertebral body heights, alignment are normal. A 3 cm cystic lesion is present medially within the left kidney. Enlarged left  para-aortic lymph node measures up to 15 mm in short axis at the level of the left renal hilum. Other smaller rounded left periaortic lymph nodes are present as well. The other nodes are less than 1 cm in short axis. L1-2:  Negative. L2-3:  Negative. L3-4: Mild facet hypertrophy is present bilaterally without significant stenosis. L4-5: Asymmetric right-sided ligamentum flavum thickening inch roots into the central canal posteriorly on the right. There is no significant stenosis. Slight disc bulging is noted. L5-S1:  Negative. A transitional S1 segment is present. A small disc is present at S1-2 without significant protrusion or stenosis. IMPRESSION: 1. Enlarged left para-aortic lymph node at the renal hilum. Cystic lesions are noted in the kidneys. The kidneys are incompletely imaged. Recommend CT of the abdomen and pelvis with contrast for further evaluation to exclude malignancy. 2. Asymmetric right-sided facet hypertrophy and ligamentum flavum thickening without focal stenosis. 3. Transitional anatomy at S1. Electronically Signed   By: Marin Roberts M.D.   On: 04/25/2016 15:07    EKG:  Orders placed or performed during the hospital encounter of 04/23/16  . ED EKG  . ED EKG    ASSESSMENT AND PLAN:  Active Problems:   DKA (diabetic ketoacidosis) (HCC)   Pressure ulcer #1. Sepsis due to Klebsiella pneumoniae, due to acute pyelonephritis 2/2 Klebsiella pneumoniae. Continue Rocephin intravenously per sensitivity results. Blood pressure has improved, and patient does not require Levophed . Urinary output was 1.4 L over the past 24 hours.   #2. Septic shock, continue IV fluids , blood pressure has improved #3. Acute renal failure likely due to ATN, improved on IV fluids. Nephrology consultation is appreciated, renal ultrasound was unremarkable, continue Foley catheter following in's and outs closely. Urine is clear. Discussed with nephrologist Dr. Cherylann Ratel today. She however continues to have  fevers of unclear etiology at this time despite antibiotic therapy.  #4 DKA due to poor diabetes control at home with hemoglobin A1c 11.1, advance insulin Lantus to 36 units, following blood glucose levels closely.  Blood glucose levels ranging between 169-332 #5. Hyponatremia,improved with IV fluid administration. Recheck sodium level in the morning, continue IV fluidst a high rate of 100 cc an hour #6. Hyperkalemia, resolved with IV fluids, insulin therapy. #7. Rhabdomyolysis, patient received acetate solution drip, improved CK levels today, kidney function has improved, urinary output is good #8. Lactic acidosis, resolved, due to hypoperfusion, septic shock, continue IV fluids . #9 Acute pyelonephritis due to Klebsiella pneumonia, unremarkable renal ultrasound, clinically improved. The patient remains on Rocephin intravenously and has high fevers #10. Generalized weakness, involving physical therapist, will need to have skilled nursing facility placement due to lower extremity weakness #11. Paraplegia, unremarkable thoracic and lumbar MRI, neurologist, felt it was rhabdomyolysis related, physical therapist recommends skilled nursing facility placement for rehabilitation #12. High fevers of unclear etiology, despite antibiotic use, get influenza testing to rule out flow, get [facility] spotted fever serology   Management plans discussed with the patient, family and they are in  agreement.   DRUG ALLERGIES:  Allergies  Allergen Reactions  . Biaxin [Clarithromycin] Rash    Patient states this medication gives her severe rash and thrush in the mouth.    CODE STATUS:     Code Status Orders        Start     Ordered   04/23/16 1833  Full code   Continuous     04/23/16 1834    Code Status History    Date Active Date Inactive Code Status Order ID Comments User Context   This patient has a current code status but no historical code status.      TOTALCritical care TIME TAKING CARE  OF THIS PATIENT: 50 minutes.    Katharina Caper M.D on 04/26/2016 at 2:26 PM  Between 7am to 6pm - Pager - [phone]  After 6pm go to [email or web address] - password [email or web address] [facility]  [facility]  Office  [phone]  CC: Primary care physician; Dortha Kern, MD

## 2016-04-26 NOTE — Progress Notes (Signed)
Patient has rested well throughout the night, she has remained alert an oriented x4.  Blood pressure has remained 100's systolic, sinus tach via monitor.  Urine output has been adequate.  Patient still complaining of some numbness in both legs but able to lift them, pedal pulses are 1+.  Will continue to monitor.

## 2016-04-26 NOTE — Progress Notes (Signed)
PT Cancellation Note  Patient Details Name: [name] MRN: [id number] DOB: Oct 13, 1961   Cancelled Treatment:    Reason Eval/Treat Not Completed: Other (comment). Treatment attempted; pt recently transferred to new room and nursing staff providing care. Plan to see pt tomorrow for continued PT treatment.    Kristeen Miss, [place] 04/26/2016, 3:44 PM

## 2016-04-26 NOTE — Progress Notes (Signed)
Patient had 2 minute run of SVT.  Patient alert and oriented, no chest pain reported, blood pressure 106/71 with a MAP of 83.  Encouraged patient in vagal maneuvers.  Patient returned to sinus tach 100-110's.  MD notified, mag level ordered and added to morning labs.  Will continue to monitor.

## 2016-04-26 NOTE — Clinical Social Work Note (Signed)
CSW provided bed offers for New SNF placement. Pt will discuss SNF options with her mother. CSW shared what her insurance would cover for SNF. CSW will follow up with bed choice. CSW will continue to follow.   Dede Query, MSW, LCSW  Clinical Social Worker  [phone]

## 2016-04-27 ENCOUNTER — Inpatient Hospital Stay: Payer: BLUE CROSS/BLUE SHIELD

## 2016-04-27 ENCOUNTER — Encounter: Payer: Self-pay | Admitting: Radiology

## 2016-04-27 DIAGNOSIS — T796XXS Traumatic ischemia of muscle, sequela: Secondary | ICD-10-CM

## 2016-04-27 LAB — BASIC METABOLIC PANEL
Anion gap: 10 (ref 5–15)
BUN: 28 mg/dL — AB (ref 6–20)
CALCIUM: 7.6 mg/dL — AB (ref 8.9–10.3)
CO2: 23 mmol/L (ref 22–32)
Chloride: 96 mmol/L — ABNORMAL LOW (ref 101–111)
Creatinine, Ser: 0.9 mg/dL (ref 0.44–1.00)
GFR calc Af Amer: >60 mL/min (ref >60)
GLUCOSE: 194 mg/dL — AB (ref 65–99)
Potassium: 3.7 mmol/L (ref 3.5–5.1)
Sodium: 129 mmol/L — ABNORMAL LOW (ref 135–145)

## 2016-04-27 LAB — VITAMIN D 25 HYDROXY (VIT D DEFICIENCY, FRACTURES): Vit D, 25-Hydroxy: 19.7 ng/mL — ABNORMAL LOW (ref 30.0–100.0)

## 2016-04-27 LAB — GLUCOSE, CAPILLARY
GLUCOSE-CAPILLARY: 121 mg/dL — AB (ref 65–99)
Glucose-Capillary: 183 mg/dL — ABNORMAL HIGH (ref 65–99)
Glucose-Capillary: 283 mg/dL — ABNORMAL HIGH (ref 65–99)
Glucose-Capillary: 123 mg/dL — ABNORMAL HIGH (ref 65–99)

## 2016-04-27 LAB — VITAMIN B1: Vitamin B1 (Thiamine): 132.3 nmol/L (ref 66.5–200.0)

## 2016-04-27 LAB — RPR: RPR: NONREACTIVE

## 2016-04-27 LAB — ROCKY MTN SPOTTED FVR ABS PNL(IGG+IGM)
RMSF IGG: NEGATIVE
RMSF IGM: 0.24 {index} (ref 0.00–0.89)

## 2016-04-27 MED ORDER — IOPAMIDOL (ISOVUE-300) INJECTION 61%
125.0000 mL | Freq: Once | INTRAVENOUS | Status: AC | PRN
  Administered 2016-04-27: 125 mL via INTRAVENOUS

## 2016-04-27 MED ORDER — CALCIUM CITRATE-VITAMIN D 500-400 MG-UNIT PO CHEW
1.0000 | CHEWABLE_TABLET | Freq: Two times a day (BID) | ORAL | Status: DC
  Administered 2016-04-27 – 2016-05-02 (×10): 1 via ORAL
  Filled 2016-04-27 (×10): qty 1

## 2016-04-27 MED ORDER — FAMOTIDINE 20 MG PO TABS
20.0000 mg | ORAL_TABLET | Freq: Two times a day (BID) | ORAL | Status: DC
  Administered 2016-04-27 – 2016-05-02 (×10): 20 mg via ORAL
  Filled 2016-04-27 (×10): qty 1

## 2016-04-27 MED ORDER — INSULIN ASPART 100 UNIT/ML ~~LOC~~ SOLN
6.0000 [IU] | Freq: Three times a day (TID) | SUBCUTANEOUS | Status: DC
  Administered 2016-04-27 – 2016-05-02 (×13): 6 [IU] via SUBCUTANEOUS
  Filled 2016-04-27 (×13): qty 6

## 2016-04-27 MED ORDER — INSULIN GLARGINE 100 UNIT/ML ~~LOC~~ SOLN
42.0000 [IU] | SUBCUTANEOUS | Status: DC
  Administered 2016-04-27 – 2016-05-01 (×5): 42 [IU] via SUBCUTANEOUS
  Filled 2016-04-27 (×6): qty 0.42

## 2016-04-27 MED ORDER — DIATRIZOATE MEGLUMINE & SODIUM 66-10 % PO SOLN
15.0000 mL | ORAL | Status: AC
  Administered 2016-04-27 (×2): 15 mL via ORAL

## 2016-04-27 MED ORDER — CEFAZOLIN SODIUM-DEXTROSE 2-4 GM/100ML-% IV SOLN
2.0000 g | Freq: Three times a day (TID) | INTRAVENOUS | Status: DC
  Administered 2016-04-28 – 2016-05-02 (×14): 2 g via INTRAVENOUS
  Filled 2016-04-27 (×17): qty 100

## 2016-04-27 MED ORDER — LIVING WELL WITH DIABETES BOOK
Freq: Once | Status: AC
  Administered 2016-04-27: 22:00:00
  Filled 2016-04-27: qty 1

## 2016-04-27 MED ORDER — SODIUM CHLORIDE 0.9 % IV SOLN
INTRAVENOUS | Status: DC
  Administered 2016-04-27: 16:00:00 via INTRAVENOUS

## 2016-04-27 NOTE — Progress Notes (Signed)
Key Points: Use following P&T approved IV to PO antibiotic change policy.  Description contains the criteria that are approved Note: Policy Excludes:  Esophagectomy patientsPHARMACIST - PHYSICIAN COMMUNICATION  CONCERNING: IV to Oral Route Change Policy  RECOMMENDATION: This patient is receiving famotidine by the intravenous route.  Based on criteria approved by the Pharmacy and Therapeutics Committee, the intravenous medication(s) is/are being converted to the equivalent oral dose form(s).   DESCRIPTION: These criteria include:  The patient is eating (either orally or via tube) and/or has been taking other orally administered medications for a least 24 hours  The patient has no evidence of active gastrointestinal bleeding or impaired GI absorption (gastrectomy, short bowel, patient on TNA or NPO).  If you have questions about this conversion, please contact the Pharmacy Department  []   [phone] )  ( [phone] [x]   [phone] )  [facility] []   [phone] )  [facility] []   [phone] )  [facility] []   [phone] )  [facility]   ( [phone], PharmD, BCPS 04/27/2016 10:48 AM

## 2016-04-27 NOTE — Consult Note (Signed)
[facility] Infectious Disease     Reason for Consult:fevers   Referring Physician: Seth Bake Date of Admission:  04/23/2016   Active Problems:   DKA (diabetic ketoacidosis) ([place])   Pressure ulcer   HPI: Ramani L Silbaugh is a 55 y.o. female admitted 4/29 from home with confusion, and weakness. She has hx DM, HTN, hyperlipidemia.  She was found to have fever to 102 and nv. On admit T was up to 104, wbc 9.6, cr 2.5, lactate elevated and CK elevatedBCX have turned + with Klebsiella PNA and UCX also + for same.  UA tntc wbc.   Has had recurrent fevers to 101. She also had DKA on admission with ARF as well but now cr down to 0.9.   Currently she reports no dysuria, denies recurrent UTIs. She has never seen urology. Main complaint remains LE numbness and weakness. No HA   Past Medical History  Diagnosis Date  . Diabetes mellitus without complication ([place])   . Hypertension   . Arthritis   . Asthma   . Hyperlipemia    Past Surgical History  Procedure Laterality Date  . Anterior cruciate ligament repair     Social History  Substance Use Topics  . Smoking status: Current Every Day Smoker  . Smokeless tobacco: None  . Alcohol Use: No   No family history on file.  Allergies:  Allergies  Allergen Reactions  . Biaxin [Clarithromycin] Rash    Patient states this medication gives her severe rash and thrush in the mouth.    Current antibiotics: Antibiotics Given (last 72 hours)    Date/Time Action Medication Dose Rate   04/25/16 0631 Given   cefTRIAXone (ROCEPHIN) 2 g in dextrose 5 % 50 mL IVPB 2 g 100 mL/hr   04/26/16 0617 Given   cefTRIAXone (ROCEPHIN) 2 g in dextrose 5 % 50 mL IVPB 2 g 100 mL/hr   04/27/16 0600 Given   cefTRIAXone (ROCEPHIN) 2 g in dextrose 5 % 50 mL IVPB 2 g 100 mL/hr      MEDICATIONS: . cefTRIAXone (ROCEPHIN)  IV  2 g Intravenous Q24H  . Chlorhexidine Gluconate Cloth  6 each Topical Q0600  . docusate sodium  100 mg Oral BID  . enoxaparin (LOVENOX)  injection  40 mg Subcutaneous Q12H  . famotidine  20 mg Oral BID  . insulin aspart  0-15 Units Subcutaneous TID WC  . insulin aspart  0-5 Units Subcutaneous QHS  . insulin aspart  4 Units Subcutaneous TID WC  . insulin glargine  36 Units Subcutaneous Q24H  . mupirocin ointment  1 application Nasal BID  . nicotine  21 mg Transdermal Daily  . nystatin  5 mL Oral QID    Review of Systems - 11 systems reviewed and negative per HPI   OBJECTIVE: Temp:  [97.8 F (36.6 C)-98.5 F (36.9 C)] 97.8 F (36.6 C) (05/03 1305) Pulse Rate:  [97-108] 98 (05/03 1305) Resp:  [18-20] 18 (05/03 1305) BP: (101-127)/(59-75) 106/69 mmHg (05/03 1305) SpO2:  [98 %-100 %] 100 % (05/03 1305) Weight:  [136.034 kg (299 lb 14.4 oz)] 136.034 kg (299 lb 14.4 oz) (05/03 0500) Physical Exam  Constitutional:  oriented to person, place, and time. appears well-developed and well-nourished. No distress. obese HENT: [place]/AT, PERRLA, no scleral icterus Mouth/Throat: Oropharynx is clear and moist. No oropharyngeal exudate.  Cardiovascular: Normal rate, regular rhythm and normal heart sounds. Pulmonary/Chest: Effort normal and breath sounds normal. No respiratory distress.  has no wheezes.  Neck supple, no  nuchal rigidity Abdominal: Soft. Bowel sounds are normal.  exhibits no distension. There is no tenderness.  Lymphadenopathy: no cervical adenopathy. No axillary adenopathy Neurological: alert and oriented to person, place, and time.  LE weakness, difficult to assess as reports muscle pain  Skin: Skin is warm and dry. No rash noted. No erythema.  Psychiatric: a normal mood and affect.  behavior is normal.    LABS: Results for orders placed or performed during the hospital encounter of 04/23/16 (from the past 48 hour(s))  Glucose, capillary     Status: Abnormal   Collection Time: 04/25/16  4:35 PM  Result Value Ref Range   Glucose-Capillary 265 (H) 65 - 99 mg/dL  Glucose, capillary     Status: Abnormal   Collection  Time: 04/25/16  9:49 PM  Result Value Ref Range   Glucose-Capillary 214 (H) 65 - 99 mg/dL  Basic metabolic panel     Status: Abnormal   Collection Time: 04/26/16  5:38 AM  Result Value Ref Range   Sodium 132 (L) 135 - 145 mmol/L   Potassium 3.9 3.5 - 5.1 mmol/L   Chloride 97 (L) 101 - 111 mmol/L   CO2 27 22 - 32 mmol/L   Glucose, Bld 178 (H) 65 - 99 mg/dL   BUN 43 (H) 6 - 20 mg/dL   Creatinine, Ser 1.09 (H) 0.44 - 1.00 mg/dL   Calcium 7.5 (L) 8.9 - 10.3 mg/dL   GFR calc non Af Amer 56 (L) >60 mL/min   GFR calc Af Amer >60 >60 mL/min    Comment: (NOTE) The eGFR has been calculated using the CKD EPI equation. This calculation has not been validated in all clinical situations. eGFR's persistently <60 mL/min signify possible Chronic Kidney Disease.    Anion gap 8 5 - 15  CBC     Status: Abnormal   Collection Time: 04/26/16  5:38 AM  Result Value Ref Range   WBC 6.0 3.6 - 11.0 K/uL   RBC 3.38 (L) 3.80 - 5.20 MIL/uL   Hemoglobin 9.6 (L) 12.0 - 16.0 g/dL   HCT 27.8 (L) 35.0 - 47.0 %   MCV 82.3 80.0 - 100.0 fL   MCH 28.3 26.0 - 34.0 pg   MCHC 34.3 32.0 - 36.0 g/dL   RDW 15.9 (H) 11.5 - 14.5 %   Platelets 151 150 - 440 K/uL  CK     Status: Abnormal   Collection Time: 04/26/16  5:38 AM  Result Value Ref Range   Total CK 563 (H) 38 - 234 U/L  Magnesium     Status: None   Collection Time: 04/26/16  5:38 AM  Result Value Ref Range   Magnesium 1.8 1.7 - 2.4 mg/dL  Glucose, capillary     Status: Abnormal   Collection Time: 04/26/16  7:25 AM  Result Value Ref Range   Glucose-Capillary 169 (H) 65 - 99 mg/dL  Glucose, capillary     Status: Abnormal   Collection Time: 04/26/16 11:36 AM  Result Value Ref Range   Glucose-Capillary 248 (H) 65 - 99 mg/dL  Glucose, capillary     Status: Abnormal   Collection Time: 04/26/16  4:16 PM  Result Value Ref Range   Glucose-Capillary 219 (H) 65 - 99 mg/dL  Influenza panel by PCR (type A & B, H1N1)     Status: None   Collection Time: 04/26/16   4:25 PM  Result Value Ref Range   Influenza A By PCR NEGATIVE NEGATIVE   Influenza B By  PCR NEGATIVE NEGATIVE   H1N1 flu by pcr NOT DETECTED NOT DETECTED    Comment:        The Xpert Flu assay (FDA approved for nasal aspirates or washes and nasopharyngeal swab specimens), is intended as an aid in the diagnosis of influenza and should not be used as a sole basis for treatment.   Glucose, capillary     Status: Abnormal   Collection Time: 04/26/16  5:34 PM  Result Value Ref Range   Glucose-Capillary 210 (H) 65 - 99 mg/dL  Glucose, capillary     Status: Abnormal   Collection Time: 04/26/16  8:51 PM  Result Value Ref Range   Glucose-Capillary 241 (H) 65 - 99 mg/dL  Glucose, capillary     Status: Abnormal   Collection Time: 04/27/16  7:30 AM  Result Value Ref Range   Glucose-Capillary 183 (H) 65 - 99 mg/dL  Basic metabolic panel     Status: Abnormal   Collection Time: 04/27/16  7:55 AM  Result Value Ref Range   Sodium 129 (L) 135 - 145 mmol/L   Potassium 3.7 3.5 - 5.1 mmol/L   Chloride 96 (L) 101 - 111 mmol/L   CO2 23 22 - 32 mmol/L   Glucose, Bld 194 (H) 65 - 99 mg/dL   BUN 28 (H) 6 - 20 mg/dL   Creatinine, Ser 0.90 0.44 - 1.00 mg/dL   Calcium 7.6 (L) 8.9 - 10.3 mg/dL   GFR calc non Af Amer >60 >60 mL/min   GFR calc Af Amer >60 >60 mL/min    Comment: (NOTE) The eGFR has been calculated using the CKD EPI equation. This calculation has not been validated in all clinical situations. eGFR's persistently <60 mL/min signify possible Chronic Kidney Disease.    Anion gap 10 5 - 15  Glucose, capillary     Status: Abnormal   Collection Time: 04/27/16 11:05 AM  Result Value Ref Range   Glucose-Capillary 283 (H) 65 - 99 mg/dL   No components found for: ESR, C REACTIVE PROTEIN MICRO: Recent Results (from the past 720 hour(s))  Blood culture (routine x 2)     Status: Abnormal   Collection Time: 04/23/16  4:35 PM  Result Value Ref Range Status   Specimen Description BLOOD RIGHT  ANTECUBITAL  Final   Special Requests   Final    BOTTLES DRAWN AEROBIC AND ANAEROBIC  AER [place] ANA 1CC   Culture  Setup Time   Final    GRAM NEGATIVE RODS IN BOTH AEROBIC AND ANAEROBIC BOTTLES CRITICAL RESULT CALLED TO, READ BACK BY AND VERIFIED WITH: Jorje Guild @ [phone] 04/24/16 by [facility]    Culture (A)  Final    KLEBSIELLA PNEUMONIAE IN BOTH AEROBIC AND ANAEROBIC BOTTLES    Report Status 04/26/2016 FINAL  Final   Organism ID, Bacteria KLEBSIELLA PNEUMONIAE  Final      Susceptibility   Klebsiella pneumoniae - MIC*    AMPICILLIN Value in next row Resistant      RESISTANT>=32    CEFAZOLIN Value in next row Sensitive      SENSITIVE<=4    CEFEPIME Value in next row Sensitive      SENSITIVE<=1    CEFTAZIDIME Value in next row Sensitive      SENSITIVE<=1    CEFTRIAXONE Value in next row Sensitive      SENSITIVE<=1    CIPROFLOXACIN Value in next row Sensitive      SENSITIVE<=0.25    GENTAMICIN Value in next row Sensitive  SENSITIVE<=1    IMIPENEM Value in next row Sensitive      SENSITIVE<=0.25    TRIMETH/SULFA Value in next row Sensitive      SENSITIVE<=20    AMPICILLIN/SULBACTAM Value in next row Sensitive      SENSITIVE8    PIP/TAZO Value in next row Sensitive      SENSITIVE<=4    Extended ESBL Value in next row Sensitive      SENSITIVE<=4    * KLEBSIELLA PNEUMONIAE  Blood Culture ID Panel (Reflexed)     Status: Abnormal   Collection Time: 04/23/16  4:35 PM  Result Value Ref Range Status   Enterococcus species NOT DETECTED NOT DETECTED Final   Vancomycin resistance NOT DETECTED NOT DETECTED Final   Listeria monocytogenes NOT DETECTED NOT DETECTED Final   Staphylococcus species NOT DETECTED NOT DETECTED Final   Staphylococcus aureus NOT DETECTED NOT DETECTED Final   Methicillin resistance NOT DETECTED NOT DETECTED Final   Streptococcus species NOT DETECTED NOT DETECTED Final   Streptococcus agalactiae NOT DETECTED NOT DETECTED Final   Streptococcus pneumoniae NOT  DETECTED NOT DETECTED Final   Streptococcus pyogenes NOT DETECTED NOT DETECTED Final   Acinetobacter baumannii NOT DETECTED NOT DETECTED Final   Enterobacteriaceae species DETECTED (A) NOT DETECTED Final    Comment: CRITICAL RESULT CALLED TO, READ BACK BY AND VERIFIED WITH: Nate Cookson @ [phone] 04/24/16 by [place]    Enterobacter cloacae complex NOT DETECTED NOT DETECTED Final   Escherichia coli NOT DETECTED NOT DETECTED Final   Klebsiella oxytoca NOT DETECTED NOT DETECTED Final   Klebsiella pneumoniae DETECTED (A) NOT DETECTED Final    Comment: CRITICAL RESULT CALLED TO, READ BACK BY AND VERIFIED WITH: Nate Cookson @ [phone] 04/24/16 by [place]    Proteus species NOT DETECTED NOT DETECTED Final   Serratia marcescens NOT DETECTED NOT DETECTED Final   Carbapenem resistance NOT DETECTED NOT DETECTED Final   Haemophilus influenzae NOT DETECTED NOT DETECTED Final   Neisseria meningitidis NOT DETECTED NOT DETECTED Final   Pseudomonas aeruginosa NOT DETECTED NOT DETECTED Final   Candida albicans NOT DETECTED NOT DETECTED Final   Candida glabrata NOT DETECTED NOT DETECTED Final   Candida krusei NOT DETECTED NOT DETECTED Final   Candida parapsilosis NOT DETECTED NOT DETECTED Final   Candida tropicalis NOT DETECTED NOT DETECTED Final  Blood culture (routine x 2)     Status: Abnormal   Collection Time: 04/23/16  4:41 PM  Result Value Ref Range Status   Specimen Description BLOOD RIGHT ARM  Final   Special Requests   Final    BOTTLES DRAWN AEROBIC AND ANAEROBIC  AER 6CC ANA 2CC   Culture  Setup Time   Final    GRAM NEGATIVE RODS IN BOTH AEROBIC AND ANAEROBIC BOTTLES CRITICAL VALUE NOTED.  VALUE IS CONSISTENT WITH PREVIOUSLY REPORTED AND CALLED VALUE.    Culture (A)  Final    KLEBSIELLA PNEUMONIAE IN BOTH AEROBIC AND ANAEROBIC BOTTLES    Report Status 04/26/2016 FINAL  Final   Organism ID, Bacteria KLEBSIELLA PNEUMONIAE  Final      Susceptibility   Klebsiella pneumoniae - MIC*    AMPICILLIN >=32  RESISTANT Resistant     CEFAZOLIN <=4 SENSITIVE Sensitive     CEFEPIME <=1 SENSITIVE Sensitive     CEFTAZIDIME <=1 SENSITIVE Sensitive     CEFTRIAXONE <=1 SENSITIVE Sensitive     CIPROFLOXACIN <=0.25 SENSITIVE Sensitive     GENTAMICIN <=1 SENSITIVE Sensitive     IMIPENEM <=0.25 SENSITIVE Sensitive  TRIMETH/SULFA <=20 SENSITIVE Sensitive     AMPICILLIN/SULBACTAM 8 SENSITIVE Sensitive     PIP/TAZO <=4 SENSITIVE Sensitive     Extended ESBL NEGATIVE Sensitive     * KLEBSIELLA PNEUMONIAE  Urine culture     Status: Abnormal   Collection Time: 04/23/16  4:41 PM  Result Value Ref Range Status   Specimen Description URINE, CLEAN CATCH  Final   Special Requests NONE  Final   Culture >=100,000 COLONIES/mL KLEBSIELLA PNEUMONIAE (A)  Final   Report Status 04/25/2016 FINAL  Final   Organism ID, Bacteria KLEBSIELLA PNEUMONIAE (A)  Final      Susceptibility   Klebsiella pneumoniae - MIC*    AMPICILLIN >=32 RESISTANT Resistant     CEFAZOLIN <=4 SENSITIVE Sensitive     CEFTRIAXONE <=1 SENSITIVE Sensitive     CIPROFLOXACIN <=0.25 SENSITIVE Sensitive     GENTAMICIN <=1 SENSITIVE Sensitive     IMIPENEM <=0.25 SENSITIVE Sensitive     NITROFURANTOIN 32 SENSITIVE Sensitive     TRIMETH/SULFA <=20 SENSITIVE Sensitive     AMPICILLIN/SULBACTAM 8 SENSITIVE Sensitive     PIP/TAZO <=4 SENSITIVE Sensitive     Extended ESBL NEGATIVE Sensitive     * >=100,000 COLONIES/mL KLEBSIELLA PNEUMONIAE  MRSA PCR Screening     Status: Abnormal   Collection Time: 04/23/16  9:06 PM  Result Value Ref Range Status   MRSA by PCR POSITIVE (A) NEGATIVE Final    Comment:        The GeneXpert MRSA Assay (FDA approved for NASAL specimens only), is one component of a comprehensive MRSA colonization surveillance program. It is not intended to diagnose MRSA infection nor to guide or monitor treatment for MRSA infections. CRITICAL RESULT CALLED TO, READ BACK BY AND VERIFIED WITH: LESLIE LEWIS AT 2312 04/23/16.PMH      IMAGING: Mr Thoracic Spine Wo Contrast  04/25/2016  CLINICAL DATA:  Fall at home and unable to get up. Lower extremity numbness and weakness since the fall. The patient was found to be an diabetic ketoacidosis and septic. EXAM: MRI THORACIC SPINE WITHOUT CONTRAST TECHNIQUE: Multiplanar, multisequence MR imaging of the thoracic spine was performed. No intravenous contrast was administered. COMPARISON:  MRI lumbar spine from the same day. FINDINGS: The study is mildly degraded by patient motion. Normal signal is present in the thoracic spinal cord to the lowest imaged level, L1, within normal limits. Mild endplate marrow changes are noted anteriorly at T9-10 and T11-12. More subtle findings are present anteriorly at T6-7, T7-8, and T8-9. Vertebral body heights and alignment are maintained. No central disc protrusion or foraminal stenosis is evident within the thoracic spine. IMPRESSION: 1. Mild anterior endplate degenerative changes T6-7 through T11-12. 2. Otherwise normal marrow signal and vertebral body heights. 3. Left renal cyst partially imaged. No significant thoracic adenopathy. Electronically Signed   By: San Morelle M.D.   On: 04/25/2016 15:11   Mr Lumbar Spine Wo Contrast  04/25/2016  CLINICAL DATA:  Bilateral lower extremity numbness and weakness after fall at home. The patient was found to be an diabetic ketoacidosis and aseptic. EXAM: MRI LUMBAR SPINE WITHOUT CONTRAST TECHNIQUE: Multiplanar, multisequence MR imaging of the lumbar spine was performed. No intravenous contrast was administered. COMPARISON:  MRI of the thoracic spine from the same day. FINDINGS: Normal signal is present in the conus medullaris which terminates at L1. Marrow signal, vertebral body heights, alignment are normal. A 3 cm cystic lesion is present medially within the left kidney. Enlarged left para-aortic lymph node measures up  to 15 mm in short axis at the level of the left renal hilum. Other smaller rounded left  periaortic lymph nodes are present as well. The other nodes are less than 1 cm in short axis. L1-2:  Negative. L2-3:  Negative. L3-4: Mild facet hypertrophy is present bilaterally without significant stenosis. L4-5: Asymmetric right-sided ligamentum flavum thickening inch roots into the central canal posteriorly on the right. There is no significant stenosis. Slight disc bulging is noted. L5-S1:  Negative. A transitional S1 segment is present. A small disc is present at S1-2 without significant protrusion or stenosis. IMPRESSION: 1. Enlarged left para-aortic lymph node at the renal hilum. Cystic lesions are noted in the kidneys. The kidneys are incompletely imaged. Recommend CT of the abdomen and pelvis with contrast for further evaluation to exclude malignancy. 2. Asymmetric right-sided facet hypertrophy and ligamentum flavum thickening without focal stenosis. 3. Transitional anatomy at S1. Electronically Signed   By: San Morelle M.D.   On: 04/25/2016 15:07   US Renal  04/24/2016  CLINICAL DATA:  Acute renal failure EXAM: RENAL / URINARY TRACT ULTRASOUND COMPLETE COMPARISON:  None. FINDINGS: Right Kidney: Length: 11.7 cm. Echogenicity within normal limits. No mass or hydronephrosis visualized. Left Kidney: Length: 11.0 cm. Echogenicity within normal limits. No mass or hydronephrosis visualized. Bladder: Bladder not evaluated as it is decompressed by Foley catheter. IMPRESSION: Kidneys are normal. Electronically Signed   By: Skipper Cliche M.D.   On: 04/24/2016 12:05   Dg Chest Portable 1 View  04/23/2016  CLINICAL DATA:  Post fall yesterday. History of diabetes, hypertension, asthma. EXAM: PORTABLE CHEST 1 VIEW COMPARISON:  None. FINDINGS: Heart size is upper normal. Lungs are clear. Lung volumes are normal. No pleural effusion or pneumothorax seen. No osseous fracture or dislocation identified. IMPRESSION: No acute findings. Electronically Signed   By: Franki Cabot M.D.   On: 04/23/2016 17:21     Assessment:   Antha L Frasco is a 55 y.o. female with recurrent fevers following admission with AMS and LE weakness. Found to have urosepsis with Klebsiella, as well as ARF, rhabdo. Has persistent LE weakness and pain but MRI L and T spine nml. Renal USS nml. Labs improving. Does have a L 3 cm renal cystic lesion noted on MRI L spine with surrounding LAN.  I suspect she has L pyelonephritis given these abnormalities but radiology suggests CT abd pelvis with contrast Day 5 of abx  Recommendations Change from ceftraixone to cefazolin Continue IV while inpatient but at DC can change to oral keflex - needs 14 day total course with followup to ensure no recurrence Needs wu of the L renal lesion with CT with contrast- would defer to primary team and renal if safe to use contrast at this time given recent ARF  Thank you very much for allowing me to participate in the care of this patient. Please call with questions.   Cheral Marker. Ola Spurr, MD

## 2016-04-27 NOTE — Progress Notes (Signed)
Physical Therapy Treatment Patient Details Name: [name] MRN: [id number] DOB: 07-Aug-1961 Today's Date: 04/27/2016    History of Present Illness [name] is a 55 y.o. female with a known history of diabetes mellitus type 2, hypertension, hyperlipidemia brought in by family for confusion, generalized weakness. Patient was admitted with hyperglycemia with blood sugar levels >600. She also has sepsis and rhabdomyolysis.     PT Comments    Pt noting improved movement in Bilateral lower extremities except ankles/feet; upon testing, no active range of motion all planes of ankle/toes. Sensation bilateral feet continues diminished left > right. Upon palpation right foot noted to be significantly cold, but with good pedal pulse; normal warm to the touch with less notable pedal pulse. Pt demonstrates improved range/strength in supine exercises at bilateral hips/knees as well as improved endurance and heart rate (range 97-105 beats per minute). Pt requiring less assist (now Mod) for supine to sit. Sits with supervision and bilateral upper extremity support/pillow placed under pt's feet for contact support; however, pt unable to feet. Pt unable to reach floor in sit without unsafe risk of sliding off edge of bed. Pt tolerates sitting exercises as well. Pt wishes to try using bed pan in sitting, as she has been unsuccessful in supine. Assist of 2 for lateral weight shifting several times to place bed pan; Min guard to ensure safety at edge of bed. Pt successful after some time; however, situation complicated by leaking of urine to floor and on pt. Pt requires Mod A to return to supine and maneuver towards center of bed to allow for personal hygiene. Therapist remained with pt beyond treatment until nursing assistant returned to complete hygiene/clean up. Continue PT to progress active range of motion and strength in Bilateral lower extremities in hopes to progress to out of bed activities/ambulation.   Follow  Up Recommendations  SNF     Equipment Recommendations  Rolling walker with 5" wheels    Recommendations for Other Services       Precautions / Restrictions Restrictions Weight Bearing Restrictions: No    Mobility  Bed Mobility Overal bed mobility: Needs Assistance Bed Mobility: Supine to Sit     Supine to sit: HOB elevated;Mod assist     General bed mobility comments: Assist as well leaning R/L in sit to allow bed pan use in sit  Transfers                 General transfer comment: Unable at this time; poor feeling in feet L > R  Ambulation/Gait             General Gait Details: unable; no active DF currently   Stairs            Wheelchair Mobility    Modified Rankin (Stroke Patients Only)       Balance   Sitting-balance support: Bilateral upper extremity supported;Feet supported Sitting balance-Leahy Scale: Fair                              Cognition Arousal/Alertness: Awake/alert Behavior During Therapy: WFL for tasks assessed/performed Overall Cognitive Status: Within Functional Limits for tasks assessed                      Exercises General Exercises - Lower Extremity Ankle Circles/Pumps: PROM;Both;20 reps;Supine Quad Sets: Strengthening;Both;20 reps;Supine Gluteal Sets: Strengthening;Both;20 reps;Supine Short Arc Quad: AROM;Both;20 reps;Supine Long Arc Quad: AROM;Both;20 reps;Seated (focus on  eccentric control) Heel Slides: AROM;Both;20 reps;Supine (support assist only to avoid friction) Hip ABduction/ADduction: AROM;Both;20 reps;Supine (assist only to avoid friction) Straight Leg Raises: AROM;Both;10 reps;Supine (2 sets) Hip Flexion/Marching: AROM;Both;20 reps;Seated (limited range)    General Comments        Pertinent Vitals/Pain      Home Living                      Prior Function            PT Goals (current goals can now be found in the care plan section) Progress towards PT goals:  Progressing toward goals    Frequency  Min 2X/week    PT Plan Current plan remains appropriate    Co-evaluation             End of Session   Activity Tolerance: Patient tolerated treatment well Patient left: in bed     Time: 4536-4680 PT Time Calculation (min) (ACUTE ONLY): 60 min  Charges:  $Therapeutic Exercise: 23-37 mins $Therapeutic Activity: 23-37 mins                    G CodesKristeen Miss, PTA 04/27/2016, 12:15 PM

## 2016-04-27 NOTE — Progress Notes (Signed)
Subjective: Sensory symptoms continue to improve.  Distal weakness continues.    Objective: Current vital signs: BP 122/59 mmHg  Pulse 104  Temp(Src) 97.8 F (36.6 C) (Oral)  Resp 18  Ht 5\' 5"  (1.651 m)  Wt 136.034 kg (299 lb 14.4 oz)  BMI 49.91 kg/m2  SpO2 99% Vital signs in last 24 hours: Temp:  [97.8 F (36.6 C)-98.8 F (37.1 C)] 97.8 F (36.6 C) (05/03 0840) Pulse Rate:  [97-115] 104 (05/03 1037) Resp:  [18-20] 18 (05/02 2045) BP: (101-127)/(59-75) 122/59 mmHg (05/03 0840) SpO2:  [97 %-100 %] 99 % (05/03 1037) Weight:  [136.034 kg (299 lb 14.4 oz)] 136.034 kg (299 lb 14.4 oz) (05/03 0500)  Intake/Output from previous day: 05/02 0701 - 05/03 0700 In: 1420 [P.O.:720; I.V.:600; IV Piggyback:100] Out: 1325 [Urine:1325] Intake/Output this shift: Total I/O In: 50 [IV Piggyback:50] Out: -  Nutritional status: Diet Carb Modified Fluid consistency:: Thin; Room service appropriate?: Yes  Neurologic Exam: Mental Status: Alert, oriented, thought content appropriate. Speech fluent without evidence of aphasia. Able to follow 3 step commands without difficulty. Cranial Nerves: II: Discs flat bilaterally; Visual fields grossly normal, pupils equal, round, reactive to light and accommodation III,IV, VI: ptosis not present, extra-ocular motions intact bilaterally V,VII: smile symmetric, facial light touch sensation normal bilaterally VIII: hearing normal bilaterally IX,X: gag reflex present XI: bilateral shoulder shrug XII: midline tongue extension Motor: Right :Upper extremity 5/5Left: Upper extremity 5/5 Patient can lift each leg off the bed 5-6 inches. Continues to be able to flex at the knee at about 3/5. 0/5 ankle flexion and extension  Sensory: Continued altered sensation, mostly in the toes.    Lab Results: Basic Metabolic Panel:  Recent Labs Lab 04/24/16 1823 04/24/16 2322 04/25/16 0600 04/26/16 0538  04/27/16 0755  NA 130* 131* 131* 132* 129*  K 3.6 3.0* 3.3* 3.9 3.7  CL 97* 95* 93* 97* 96*  CO2 21* 23 25 27 23   GLUCOSE 217* 131* 282* 178* 194*  BUN 80* 71* 67* 43* 28*  CREATININE 2.18* 1.83* 1.64* 1.09* 0.90  CALCIUM 7.6* 7.6* 7.3* 7.5* 7.6*  MG  --   --   --  1.8  --     Liver Function Tests:  Recent Labs Lab 04/23/16 1641  AST 90*  ALT 45  ALKPHOS 61  BILITOT 0.9  PROT 7.4  ALBUMIN 2.7*   No results for input(s): LIPASE, AMYLASE in the last 168 hours. No results for input(s): AMMONIA in the last 168 hours.  CBC:  Recent Labs Lab 04/23/16 1641 04/23/16 2020 04/24/16 0439 04/25/16 0600 04/26/16 0538  WBC 9.6 6.5 5.3 5.7 6.0  NEUTROABS 8.8*  --   --   --   --   HGB 10.6* 8.4* 10.1* 9.6* 9.6*  HCT 32.7* 25.3* 30.0* 28.5* 27.8*  MCV 86.1 84.0 82.9 81.4 82.3  PLT 203 152 168 134* 151    Cardiac Enzymes:  Recent Labs Lab 04/23/16 1641 04/23/16 1642 04/23/16 2020 04/24/16 0730 04/25/16 0600 04/26/16 0538  CKTOTAL  --  5665* 3670* 2934* 1395* 563*  TROPONINI 0.03  --   --   --   --   --     Lipid Panel: No results for input(s): CHOL, TRIG, HDL, CHOLHDL, VLDL, LDLCALC in the last 168 hours.  CBG:  Recent Labs Lab 04/26/16 1616 04/26/16 1734 04/26/16 2051 04/27/16 0730 04/27/16 1105  GLUCAP 219* 210* 241* 183* 283*    Microbiology: Results for orders placed or performed during the hospital  encounter of 04/23/16  Blood culture (routine x 2)     Status: Abnormal   Collection Time: 04/23/16  4:35 PM  Result Value Ref Range Status   Specimen Description BLOOD RIGHT ANTECUBITAL  Final   Special Requests   Final    BOTTLES DRAWN AEROBIC AND ANAEROBIC  AER 7CC ANA 1CC   Culture  Setup Time   Final    GRAM NEGATIVE RODS IN BOTH AEROBIC AND ANAEROBIC BOTTLES CRITICAL RESULT CALLED TO, READ BACK BY AND VERIFIED WITH: Morton Stall @ [phone] 04/24/16 by [facility]    Culture (A)  Final    KLEBSIELLA PNEUMONIAE IN BOTH AEROBIC AND ANAEROBIC BOTTLES     Report Status 04/26/2016 FINAL  Final   Organism ID, Bacteria KLEBSIELLA PNEUMONIAE  Final      Susceptibility   Klebsiella pneumoniae - MIC*    AMPICILLIN Value in next row Resistant      RESISTANT>=32    CEFAZOLIN Value in next row Sensitive      SENSITIVE<=4    CEFEPIME Value in next row Sensitive      SENSITIVE<=1    CEFTAZIDIME Value in next row Sensitive      SENSITIVE<=1    CEFTRIAXONE Value in next row Sensitive      SENSITIVE<=1    CIPROFLOXACIN Value in next row Sensitive      SENSITIVE<=0.25    GENTAMICIN Value in next row Sensitive      SENSITIVE<=1    IMIPENEM Value in next row Sensitive      SENSITIVE<=0.25    TRIMETH/SULFA Value in next row Sensitive      SENSITIVE<=20    AMPICILLIN/SULBACTAM Value in next row Sensitive      SENSITIVE8    PIP/TAZO Value in next row Sensitive      SENSITIVE<=4    Extended ESBL Value in next row Sensitive      SENSITIVE<=4    * KLEBSIELLA PNEUMONIAE  Blood Culture ID Panel (Reflexed)     Status: Abnormal   Collection Time: 04/23/16  4:35 PM  Result Value Ref Range Status   Enterococcus species NOT DETECTED NOT DETECTED Final   Vancomycin resistance NOT DETECTED NOT DETECTED Final   Listeria monocytogenes NOT DETECTED NOT DETECTED Final   Staphylococcus species NOT DETECTED NOT DETECTED Final   Staphylococcus aureus NOT DETECTED NOT DETECTED Final   Methicillin resistance NOT DETECTED NOT DETECTED Final   Streptococcus species NOT DETECTED NOT DETECTED Final   Streptococcus agalactiae NOT DETECTED NOT DETECTED Final   Streptococcus pneumoniae NOT DETECTED NOT DETECTED Final   Streptococcus pyogenes NOT DETECTED NOT DETECTED Final   Acinetobacter baumannii NOT DETECTED NOT DETECTED Final   Enterobacteriaceae species DETECTED (A) NOT DETECTED Final    Comment: CRITICAL RESULT CALLED TO, READ BACK BY AND VERIFIED WITH: Nate Cookson @ [phone] 04/24/16 by [facility]    Enterobacter cloacae complex NOT DETECTED NOT DETECTED Final    Escherichia coli NOT DETECTED NOT DETECTED Final   Klebsiella oxytoca NOT DETECTED NOT DETECTED Final   Klebsiella pneumoniae DETECTED (A) NOT DETECTED Final    Comment: CRITICAL RESULT CALLED TO, READ BACK BY AND VERIFIED WITH: Nate Cookson @ [phone] 04/24/16 by [facility]    Proteus species NOT DETECTED NOT DETECTED Final   Serratia marcescens NOT DETECTED NOT DETECTED Final   Carbapenem resistance NOT DETECTED NOT DETECTED Final   Haemophilus influenzae NOT DETECTED NOT DETECTED Final   Neisseria meningitidis NOT DETECTED NOT DETECTED Final   Pseudomonas aeruginosa NOT DETECTED NOT  DETECTED Final   Candida albicans NOT DETECTED NOT DETECTED Final   Candida glabrata NOT DETECTED NOT DETECTED Final   Candida krusei NOT DETECTED NOT DETECTED Final   Candida parapsilosis NOT DETECTED NOT DETECTED Final   Candida tropicalis NOT DETECTED NOT DETECTED Final  Blood culture (routine x 2)     Status: Abnormal   Collection Time: 04/23/16  4:41 PM  Result Value Ref Range Status   Specimen Description BLOOD RIGHT ARM  Final   Special Requests   Final    BOTTLES DRAWN AEROBIC AND ANAEROBIC  AER 6CC ANA 2CC   Culture  Setup Time   Final    GRAM NEGATIVE RODS IN BOTH AEROBIC AND ANAEROBIC BOTTLES CRITICAL VALUE NOTED.  VALUE IS CONSISTENT WITH PREVIOUSLY REPORTED AND CALLED VALUE.    Culture (A)  Final    KLEBSIELLA PNEUMONIAE IN BOTH AEROBIC AND ANAEROBIC BOTTLES    Report Status 04/26/2016 FINAL  Final   Organism ID, Bacteria KLEBSIELLA PNEUMONIAE  Final      Susceptibility   Klebsiella pneumoniae - MIC*    AMPICILLIN >=32 RESISTANT Resistant     CEFAZOLIN <=4 SENSITIVE Sensitive     CEFEPIME <=1 SENSITIVE Sensitive     CEFTAZIDIME <=1 SENSITIVE Sensitive     CEFTRIAXONE <=1 SENSITIVE Sensitive     CIPROFLOXACIN <=0.25 SENSITIVE Sensitive     GENTAMICIN <=1 SENSITIVE Sensitive     IMIPENEM <=0.25 SENSITIVE Sensitive     TRIMETH/SULFA <=20 SENSITIVE Sensitive     AMPICILLIN/SULBACTAM 8  SENSITIVE Sensitive     PIP/TAZO <=4 SENSITIVE Sensitive     Extended ESBL NEGATIVE Sensitive     * KLEBSIELLA PNEUMONIAE  Urine culture     Status: Abnormal   Collection Time: 04/23/16  4:41 PM  Result Value Ref Range Status   Specimen Description URINE, CLEAN CATCH  Final   Special Requests NONE  Final   Culture >=100,000 COLONIES/mL KLEBSIELLA PNEUMONIAE (A)  Final   Report Status 04/25/2016 FINAL  Final   Organism ID, Bacteria KLEBSIELLA PNEUMONIAE (A)  Final      Susceptibility   Klebsiella pneumoniae - MIC*    AMPICILLIN >=32 RESISTANT Resistant     CEFAZOLIN <=4 SENSITIVE Sensitive     CEFTRIAXONE <=1 SENSITIVE Sensitive     CIPROFLOXACIN <=0.25 SENSITIVE Sensitive     GENTAMICIN <=1 SENSITIVE Sensitive     IMIPENEM <=0.25 SENSITIVE Sensitive     NITROFURANTOIN 32 SENSITIVE Sensitive     TRIMETH/SULFA <=20 SENSITIVE Sensitive     AMPICILLIN/SULBACTAM 8 SENSITIVE Sensitive     PIP/TAZO <=4 SENSITIVE Sensitive     Extended ESBL NEGATIVE Sensitive     * >=100,000 COLONIES/mL KLEBSIELLA PNEUMONIAE  MRSA PCR Screening     Status: Abnormal   Collection Time: 04/23/16  9:06 PM  Result Value Ref Range Status   MRSA by PCR POSITIVE (A) NEGATIVE Final    Comment:        The GeneXpert MRSA Assay (FDA approved for NASAL specimens only), is one component of a comprehensive MRSA colonization surveillance program. It is not intended to diagnose MRSA infection nor to guide or monitor treatment for MRSA infections. CRITICAL RESULT CALLED TO, READ BACK BY AND VERIFIED WITH: Lauris Serviss LEWIS AT 2312 04/23/16.PMH     Coagulation Studies: No results for input(s): LABPROT, INR in the last 72 hours.  Imaging: Mr Thoracic Spine Wo Contrast  04/25/2016  CLINICAL DATA:  Fall at home and unable to get up. Lower extremity numbness and  weakness since the fall. The patient was found to be an diabetic ketoacidosis and septic. EXAM: MRI THORACIC SPINE WITHOUT CONTRAST TECHNIQUE: Multiplanar,  multisequence MR imaging of the thoracic spine was performed. No intravenous contrast was administered. COMPARISON:  MRI lumbar spine from the same day. FINDINGS: The study is mildly degraded by patient motion. Normal signal is present in the thoracic spinal cord to the lowest imaged level, L1, within normal limits. Mild endplate marrow changes are noted anteriorly at T9-10 and T11-12. More subtle findings are present anteriorly at T6-7, T7-8, and T8-9. Vertebral body heights and alignment are maintained. No central disc protrusion or foraminal stenosis is evident within the thoracic spine. IMPRESSION: 1. Mild anterior endplate degenerative changes T6-7 through T11-12. 2. Otherwise normal marrow signal and vertebral body heights. 3. Left renal cyst partially imaged. No significant thoracic adenopathy. Electronically Signed   By: Marin Roberts M.D.   On: 04/25/2016 15:11   Mr Lumbar Spine Wo Contrast  04/25/2016  CLINICAL DATA:  Bilateral lower extremity numbness and weakness after fall at home. The patient was found to be an diabetic ketoacidosis and aseptic. EXAM: MRI LUMBAR SPINE WITHOUT CONTRAST TECHNIQUE: Multiplanar, multisequence MR imaging of the lumbar spine was performed. No intravenous contrast was administered. COMPARISON:  MRI of the thoracic spine from the same day. FINDINGS: Normal signal is present in the conus medullaris which terminates at L1. Marrow signal, vertebral body heights, alignment are normal. A 3 cm cystic lesion is present medially within the left kidney. Enlarged left para-aortic lymph node measures up to 15 mm in short axis at the level of the left renal hilum. Other smaller rounded left periaortic lymph nodes are present as well. The other nodes are less than 1 cm in short axis. L1-2:  Negative. L2-3:  Negative. L3-4: Mild facet hypertrophy is present bilaterally without significant stenosis. L4-5: Asymmetric right-sided ligamentum flavum thickening inch roots into the  central canal posteriorly on the right. There is no significant stenosis. Slight disc bulging is noted. L5-S1:  Negative. A transitional S1 segment is present. A small disc is present at S1-2 without significant protrusion or stenosis. IMPRESSION: 1. Enlarged left para-aortic lymph node at the renal hilum. Cystic lesions are noted in the kidneys. The kidneys are incompletely imaged. Recommend CT of the abdomen and pelvis with contrast for further evaluation to exclude malignancy. 2. Asymmetric right-sided facet hypertrophy and ligamentum flavum thickening without focal stenosis. 3. Transitional anatomy at S1. Electronically Signed   By: Marin Roberts M.D.   On: 04/25/2016 15:07    Medications:  I have reviewed the patient's current medications. Scheduled: . cefTRIAXone (ROCEPHIN)  IV  2 g Intravenous Q24H  . Chlorhexidine Gluconate Cloth  6 each Topical Q0600  . docusate sodium  100 mg Oral BID  . enoxaparin (LOVENOX) injection  40 mg Subcutaneous Q12H  . famotidine  20 mg Oral BID  . insulin aspart  0-15 Units Subcutaneous TID WC  . insulin aspart  0-5 Units Subcutaneous QHS  . insulin aspart  4 Units Subcutaneous TID WC  . insulin glargine  36 Units Subcutaneous Q24H  . mupirocin ointment  1 application Nasal BID  . nicotine  21 mg Transdermal Daily  . nystatin  5 mL Oral QID    Assessment/Plan: Patient continues to improve.  RPR and B1 are normal.    Recommendations: 1.  Agree with therapy   LOS: 4 days   Thana Farr, MD Neurology [phone] 04/27/2016  12:22 PM

## 2016-04-27 NOTE — Plan of Care (Signed)
Problem: Physical Regulation: Goal: Will remain free from infection Outcome: Progressing Remains on IV Antibiotics  Problem: Skin Integrity: Goal: Risk for impaired skin integrity will decrease Outcome: Progressing Followed by Wound Care, RN. Followed by I.D., MD.  Problem: Activity: Goal: Risk for activity intolerance will decrease Outcome: Progressing Followed by PT.  Problem: Skin Integrity: Goal: Risk for impaired skin integrity will decrease Outcome: Progressing Followed by Wound Care RN. Followed by I.D., MD.

## 2016-04-27 NOTE — Progress Notes (Signed)
Clinical Education officer, museum (CSW) met with patient for the second time today to follow up on bed choice. [facility] chose [facility] liaison is aware of accepted bed offer. Per [name] he will start [facility] authorization today. CSW sent PT notes to [facility] today. CSW will continue to follow and assist as needed.   Blima Rich, LCSW [phone]

## 2016-04-27 NOTE — Progress Notes (Signed)
Spoke with patient regarding elevated A1C.  She states that she was on Victoza prior to admit so she is familiar with giving self injections.  She would prefer pen device like Victoza for her insulin.  She will need further teaching and follow-up for diabetes.  Ordered Living well with diabetes booklet for patient to review.  Will continue to follow.  Thanks, Beryl Meager, RN, BC-ADM Inpatient Diabetes Coordinator Pager [phone] (8a-5p)

## 2016-04-27 NOTE — Progress Notes (Signed)
Pharmacy Antibiotic Note  [name] is a 55 y.o. female admitted on 04/23/2016 with bacteremia.  Pharmacy has been consulted for cefazolin dosing.  Plan: Patient received Ceftriaxone 2 gm IV today at 0600.  Will order Cefazolin to start on 5/4 at 0600 and will dose 2 gm IV q8h for treatment of bacteremia.  Height: 5\' 5"  (165.1 cm) Weight: 299 lb 14.4 oz (136.034 kg) IBW/kg (Calculated) : 57  Temp (24hrs), Avg:98 F (36.7 C), Min:97.8 F (36.6 C), Max:98.5 F (36.9 C)   Recent Labs Lab 04/23/16 1641 04/23/16 2020  04/24/16 0439  04/24/16 1823 04/24/16 2322 04/25/16 0600 04/26/16 0538 04/27/16 0755  WBC 9.6 6.5  --  5.3  --   --   --  5.7 6.0  --   CREATININE 3.10* 2.36*  < > 2.53*  < > 2.18* 1.83* 1.64* 1.09* 0.90  LATICACIDVEN 2.2* 2.8*  --   --   --   --   --   --   --   --   < > = values in this interval not displayed.  Estimated Creatinine Clearance: 99.9 mL/min (by C-G formula based on Cr of 0.9).    Allergies  Allergen Reactions  . Biaxin [Clarithromycin] Rash    Patient states this medication gives her severe rash and thrush in the mouth.    Antimicrobials this admission: Anti-infectives    Start     Dose/Rate Route Frequency Ordered Stop   04/28/16 0600  ceFAZolin (ANCEF) IVPB 2g/100 mL premix     2 g 200 mL/hr over 30 Minutes Intravenous Every 8 hours 04/27/16 1418     04/24/16 0700  cefTRIAXone (ROCEPHIN) 2 g in dextrose 5 % 50 mL IVPB  Status:  Discontinued     2 g 100 mL/hr over 30 Minutes Intravenous Every 24 hours 04/24/16 0634 04/27/16 1341   04/23/16 1845  cefTRIAXone (ROCEPHIN) 1 g in dextrose 5 % 50 mL IVPB  Status:  Discontinued     1 g 100 mL/hr over 30 Minutes Intravenous Every 24 hours 04/23/16 1834 04/24/16 0634   04/23/16 1645  vancomycin (VANCOCIN) IVPB 1000 mg/200 mL premix     1,000 mg 200 mL/hr over 60 Minutes Intravenous  Once 04/23/16 1644 04/23/16 1832   04/23/16 1645  piperacillin-tazobactam (ZOSYN) IVPB 3.375 g     3.375 g 12.5  mL/hr over 240 Minutes Intravenous  Once 04/23/16 1644 04/23/16 1725      Microbiology results: Results for orders placed or performed during the hospital encounter of 04/23/16  Blood culture (routine x 2)     Status: Abnormal   Collection Time: 04/23/16  4:35 PM  Result Value Ref Range Status   Specimen Description BLOOD RIGHT ANTECUBITAL  Final   Special Requests   Final    BOTTLES DRAWN AEROBIC AND ANAEROBIC  AER 7CC ANA 1CC   Culture  Setup Time   Final    GRAM NEGATIVE RODS IN BOTH AEROBIC AND ANAEROBIC BOTTLES CRITICAL RESULT CALLED TO, READ BACK BY AND VERIFIED WITH: Morton Stall @ [phone] 04/24/16 by [facility]    Culture (A)  Final    KLEBSIELLA PNEUMONIAE IN BOTH AEROBIC AND ANAEROBIC BOTTLES    Report Status 04/26/2016 FINAL  Final   Organism ID, Bacteria KLEBSIELLA PNEUMONIAE  Final      Susceptibility   Klebsiella pneumoniae - MIC*    AMPICILLIN Value in next row Resistant      RESISTANT>=32    CEFAZOLIN Value in next row  Sensitive      SENSITIVE<=4    CEFEPIME Value in next row Sensitive      SENSITIVE<=1    CEFTAZIDIME Value in next row Sensitive      SENSITIVE<=1    CEFTRIAXONE Value in next row Sensitive      SENSITIVE<=1    CIPROFLOXACIN Value in next row Sensitive      SENSITIVE<=0.25    GENTAMICIN Value in next row Sensitive      SENSITIVE<=1    IMIPENEM Value in next row Sensitive      SENSITIVE<=0.25    TRIMETH/SULFA Value in next row Sensitive      SENSITIVE<=20    AMPICILLIN/SULBACTAM Value in next row Sensitive      SENSITIVE8    PIP/TAZO Value in next row Sensitive      SENSITIVE<=4    Extended ESBL Value in next row Sensitive      SENSITIVE<=4    * KLEBSIELLA PNEUMONIAE  Blood Culture ID Panel (Reflexed)     Status: Abnormal   Collection Time: 04/23/16  4:35 PM  Result Value Ref Range Status   Enterococcus species NOT DETECTED NOT DETECTED Final   Vancomycin resistance NOT DETECTED NOT DETECTED Final   Listeria monocytogenes NOT DETECTED NOT  DETECTED Final   Staphylococcus species NOT DETECTED NOT DETECTED Final   Staphylococcus aureus NOT DETECTED NOT DETECTED Final   Methicillin resistance NOT DETECTED NOT DETECTED Final   Streptococcus species NOT DETECTED NOT DETECTED Final   Streptococcus agalactiae NOT DETECTED NOT DETECTED Final   Streptococcus pneumoniae NOT DETECTED NOT DETECTED Final   Streptococcus pyogenes NOT DETECTED NOT DETECTED Final   Acinetobacter baumannii NOT DETECTED NOT DETECTED Final   Enterobacteriaceae species DETECTED (A) NOT DETECTED Final    Comment: CRITICAL RESULT CALLED TO, READ BACK BY AND VERIFIED WITH: Nate Cookson @ [phone] 04/24/16 by [facility]    Enterobacter cloacae complex NOT DETECTED NOT DETECTED Final   Escherichia coli NOT DETECTED NOT DETECTED Final   Klebsiella oxytoca NOT DETECTED NOT DETECTED Final   Klebsiella pneumoniae DETECTED (A) NOT DETECTED Final    Comment: CRITICAL RESULT CALLED TO, READ BACK BY AND VERIFIED WITH: Nate Cookson @ [phone] 04/24/16 by [facility]    Proteus species NOT DETECTED NOT DETECTED Final   Serratia marcescens NOT DETECTED NOT DETECTED Final   Carbapenem resistance NOT DETECTED NOT DETECTED Final   Haemophilus influenzae NOT DETECTED NOT DETECTED Final   Neisseria meningitidis NOT DETECTED NOT DETECTED Final   Pseudomonas aeruginosa NOT DETECTED NOT DETECTED Final   Candida albicans NOT DETECTED NOT DETECTED Final   Candida glabrata NOT DETECTED NOT DETECTED Final   Candida krusei NOT DETECTED NOT DETECTED Final   Candida parapsilosis NOT DETECTED NOT DETECTED Final   Candida tropicalis NOT DETECTED NOT DETECTED Final  Blood culture (routine x 2)     Status: Abnormal   Collection Time: 04/23/16  4:41 PM  Result Value Ref Range Status   Specimen Description BLOOD RIGHT ARM  Final   Special Requests   Final    BOTTLES DRAWN AEROBIC AND ANAEROBIC  AER 6CC ANA 2CC   Culture  Setup Time   Final    GRAM NEGATIVE RODS IN BOTH AEROBIC AND ANAEROBIC BOTTLES CRITICAL  VALUE NOTED.  VALUE IS CONSISTENT WITH PREVIOUSLY REPORTED AND CALLED VALUE.    Culture (A)  Final    KLEBSIELLA PNEUMONIAE IN BOTH AEROBIC AND ANAEROBIC BOTTLES    Report Status 04/26/2016 FINAL  Final   Organism ID,  Bacteria KLEBSIELLA PNEUMONIAE  Final      Susceptibility   Klebsiella pneumoniae - MIC*    AMPICILLIN >=32 RESISTANT Resistant     CEFAZOLIN <=4 SENSITIVE Sensitive     CEFEPIME <=1 SENSITIVE Sensitive     CEFTAZIDIME <=1 SENSITIVE Sensitive     CEFTRIAXONE <=1 SENSITIVE Sensitive     CIPROFLOXACIN <=0.25 SENSITIVE Sensitive     GENTAMICIN <=1 SENSITIVE Sensitive     IMIPENEM <=0.25 SENSITIVE Sensitive     TRIMETH/SULFA <=20 SENSITIVE Sensitive     AMPICILLIN/SULBACTAM 8 SENSITIVE Sensitive     PIP/TAZO <=4 SENSITIVE Sensitive     Extended ESBL NEGATIVE Sensitive     * KLEBSIELLA PNEUMONIAE  Urine culture     Status: Abnormal   Collection Time: 04/23/16  4:41 PM  Result Value Ref Range Status   Specimen Description URINE, CLEAN CATCH  Final   Special Requests NONE  Final   Culture >=100,000 COLONIES/mL KLEBSIELLA PNEUMONIAE (A)  Final   Report Status 04/25/2016 FINAL  Final   Organism ID, Bacteria KLEBSIELLA PNEUMONIAE (A)  Final      Susceptibility   Klebsiella pneumoniae - MIC*    AMPICILLIN >=32 RESISTANT Resistant     CEFAZOLIN <=4 SENSITIVE Sensitive     CEFTRIAXONE <=1 SENSITIVE Sensitive     CIPROFLOXACIN <=0.25 SENSITIVE Sensitive     GENTAMICIN <=1 SENSITIVE Sensitive     IMIPENEM <=0.25 SENSITIVE Sensitive     NITROFURANTOIN 32 SENSITIVE Sensitive     TRIMETH/SULFA <=20 SENSITIVE Sensitive     AMPICILLIN/SULBACTAM 8 SENSITIVE Sensitive     PIP/TAZO <=4 SENSITIVE Sensitive     Extended ESBL NEGATIVE Sensitive     * >=100,000 COLONIES/mL KLEBSIELLA PNEUMONIAE  MRSA PCR Screening     Status: Abnormal   Collection Time: 04/23/16  9:06 PM  Result Value Ref Range Status   MRSA by PCR POSITIVE (A) NEGATIVE Final    Comment:        The GeneXpert MRSA  Assay (FDA approved for NASAL specimens only), is one component of a comprehensive MRSA colonization surveillance program. It is not intended to diagnose MRSA infection nor to guide or monitor treatment for MRSA infections. CRITICAL RESULT CALLED TO, READ BACK BY AND VERIFIED WITH: LESLIE LEWIS AT 2312 04/23/16.PMH      Thank you for allowing pharmacy to be a part of this patient's care.  Keyandre Pileggi G 04/27/2016 2:20 PM

## 2016-04-27 NOTE — Progress Notes (Signed)
Clinical Social Worker (CSW) met with patient to get SNF choice. Per patient she has narrowed it down between [facility] CSW encouraged patient to chose a facility so [employer] authorization can be started. Per patient she will talk with her mother today and have a decision by lunch time. CSW will continue to follow and assist as needed.   Blima Rich, LCSW [phone]

## 2016-04-27 NOTE — Plan of Care (Signed)
Problem: Skin Integrity: Goal: Risk for impaired skin integrity will decrease Outcome: Not Progressing Pt refuses to be turned during the night shift.  States she cannot sleep on her side.  Problem: Activity: Goal: Risk for activity intolerance will decrease Outcome: Not Progressing Pt continues to be weak.  Attempted to get to [facility] with no success.  Pt too weak.

## 2016-04-27 NOTE — Progress Notes (Signed)
[facility] - [place] at [facility]   PATIENT NAME: [name]    MR#:  [id number]  DATE OF BIRTH:  1961/08/28  SUBJECTIVE:  CHIEF COMPLAINT:   Chief Complaint  Patient presents with  . Fall  . Hyperglycemia  Patient is 55 year old Caucasian female with past medical history significant for history of diabetes mellitus type 2, essential hypertension, hyperlipidemia who was brought to the hospital with confusion, generalized weakness, fever, nausea and vomiting, decreased urination, decreased oral intake. On arrival to the hospital patient was noted to be in DKA with elevated anion gap, blood glucose levels, hyponatremia, lactic acidosis, rhabdomyolysis. Urinalysis revealed pyuria. She was admitted to the hospital with sepsis, DKA, she was initiated on Rocephin intravenously. She remains febrile with temperature reaching 101 intermittently despite antibiotic therapy, blood pressure Is improved on IV fluids. Urine culture is growing Klebsiella pneumonia, blood cultures also revealed Klebsiella pneumonia. Kidney function is improving with creatinine of 1.09 and estimated GFR of 56 today, up from a GFR of 20, few days ago. . Sodium level has improved. The patient's potassium level normalized. Patient complained of bilateral lower extremity weakness and numbness, especially left lower extremity, which seems to be improving. Patient underwent MRI of thoracic and lumbar spine which was unremarkable, neurologist saw patient in consultation and felt that it was rhabdomyolysis related.  No pain reported. Overall, patient feels better, stronger, Better moving lower extremities, however, complains of some left thigh discomfort on palpation. High fevers yesterday in the middle of the day, patient was seen by infectious disease specialist, Dr. Sampson Goon, who recommended to change Rocephin to cefazolin and continue antibiotics for 14 day course for pyelonephritis, also recommended to get CT  with contrast for left renal lesion evaluation. Review of Systems  Constitutional: Positive for fever, chills and malaise/fatigue. Negative for weight loss.  HENT: Negative for congestion.   Eyes: Negative for blurred vision and double vision.  Respiratory: Negative for cough, sputum production, shortness of breath and wheezing.   Cardiovascular: Negative for chest pain, palpitations, orthopnea, leg swelling and PND.  Gastrointestinal: Negative for nausea, vomiting, abdominal pain, diarrhea, constipation and blood in stool.  Genitourinary: Negative for dysuria, urgency, frequency and hematuria.  Musculoskeletal: Negative for falls.  Neurological: Positive for sensory change, focal weakness and weakness. Negative for dizziness, tremors and headaches.  Endo/Heme/Allergies: Does not bruise/bleed easily.  Psychiatric/Behavioral: Negative for depression. The patient does not have insomnia.     VITAL SIGNS: Blood pressure 106/69, pulse 98, temperature 97.8 F (36.6 C), temperature source Oral, resp. rate 18, height 5\' 5"  (1.651 m), weight 136.034 kg (299 lb 14.4 oz), SpO2 100 %.  PHYSICAL EXAMINATION:   GENERAL:  55 y.o.-year-old patient lying in the bed with no acute distress.  More animated, cheeks are pink.  EYES: Pupils equal, round, reactive to light and accommodation. No scleral icterus. Extraocular muscles intact.  HEENT: Head atraumatic, normocephalic. Oropharynx and nasopharynx clear.  NECK:  Supple, no jugular venous distention. No thyroid enlargement, no tenderness.  LUNGS:  Some diminished breath sounds bilaterally, no wheezing, rales,rhonchi or crepitation, poor effort . No use of accessory muscles of respiration.  CARDIOVASCULAR: S1, S2 normal. No murmurs, rubs, or gallops.  Tachycardic ABDOMEN: Soft,  minimally uncomfortable in suprapubic area, nondistended. Bowel sounds present. No organomegaly or mass.  Comes urine in Foley catheter is light yellow color, cloudy EXTREMITIES: No  pedal edema, cyanosis, or clubbing.  NEUROLOGIC: Cranial nerves II through XII are intact. Muscle strength 3/5 in both lower  extremities, better in the right lower extremity, withdraws from painful stimuli. Sensation to light touch is decreased in both legs, but better in the right lower extremity.  Gait not checked. Palpation of the left lower extremity muscles is painful, but no fluctuations were noted and no focal point of tenderness PSYCHIATRIC: The patient is alert and oriented x 3.  SKIN: No obvious rash, lesion, or ulcer.   ORDERS/RESULTS REVIEWED:   CBC  Recent Labs Lab 04/23/16 1641 04/23/16 2020 04/24/16 0439 04/25/16 0600 04/26/16 0538  WBC 9.6 6.5 5.3 5.7 6.0  HGB 10.6* 8.4* 10.1* 9.6* 9.6*  HCT 32.7* 25.3* 30.0* 28.5* 27.8*  PLT 203 152 168 134* 151  MCV 86.1 84.0 82.9 81.4 82.3  MCH 27.9 27.8 27.8 27.5 28.3  MCHC 32.4 33.1 33.6 33.7 34.3  RDW 16.0* 15.9* 15.8* 15.7* 15.9*  LYMPHSABS 0.5*  --   --   --   --   MONOABS 0.3  --   --   --   --   EOSABS 0.0  --   --   --   --   BASOSABS 0.0  --   --   --   --    ------------------------------------------------------------------------------------------------------------------  Chemistries   Recent Labs Lab 04/23/16 1641  04/24/16 1823 04/24/16 2322 04/25/16 0600 04/26/16 0538 04/27/16 0755  NA 117*  < > 130* 131* 131* 132* 129*  K 5.2*  < > 3.6 3.0* 3.3* 3.9 3.7  CL 82*  < > 97* 95* 93* 97* 96*  CO2 18*  < > 21* 23 25 27 23   GLUCOSE 788*  < > 217* 131* 282* 178* 194*  BUN 99*  < > 80* 71* 67* 43* 28*  CREATININE 3.10*  < > 2.18* 1.83* 1.64* 1.09* 0.90  CALCIUM 8.5*  < > 7.6* 7.6* 7.3* 7.5* 7.6*  MG  --   --   --   --   --  1.8  --   AST 90*  --   --   --   --   --   --   ALT 45  --   --   --   --   --   --   ALKPHOS 61  --   --   --   --   --   --   BILITOT 0.9  --   --   --   --   --   --   < > = values in this interval not  displayed. ------------------------------------------------------------------------------------------------------------------ estimated creatinine clearance is 99.9 mL/min (by C-G formula based on Cr of 0.9). ------------------------------------------------------------------------------------------------------------------  Recent Labs  04/25/16 1107  TSH 1.476    Cardiac Enzymes  Recent Labs Lab 04/23/16 1641  TROPONINI 0.03   ------------------------------------------------------------------------------------------------------------------ Invalid input(s): POCBNP ---------------------------------------------------------------------------------------------------------------  RADIOLOGY: No results found.  EKG:  Orders placed or performed during the hospital encounter of 04/23/16  . ED EKG  . ED EKG    ASSESSMENT AND PLAN:  Active Problems:   DKA (diabetic ketoacidosis) (HCC)   Pressure ulcer #1. Sepsis due to Klebsiella pneumoniae, due to acute pyelonephritis 2/2 Klebsiella pneumoniae. Continue Cefazolin, changed from Rocephin per Dr. 04/25/16 recommendations per sensitivity results. Changed to oral Keflex prior to discharge home/skilled nursing facility for rehabilitation . Blood pressure has improved, and patient does not require Levophed . Urinary output was about 1.4 L over the past 24 hours.   #2. Septic shock, continue IV fluids and hydrated due to planned CT scan of abdomen  and pelvis with contrast, blood pressure has improved #3. Acute renal failure likely due to ATN, improved on IV fluids. Nephrology consultation is appreciated, renal ultrasound was unremarkable, however MRI revealed a left renal cyst, CT of abdomen and pelvis with contrast to exclude malignancy was recommended, discontinue Foley catheter , follow creatinine in the morning after CT scan is done, continue IV fluids at a high rate #4 DKA due to poor diabetes control at home with hemoglobin A1c 11.1,  advance insulin Lantus to 42 units, following blood glucose levels closely.  Blood glucose levels ranging between 183-283 today #5. Hyponatremia,iworsened today, could be due to hyperglycemia advancing IV fluids. Recheck sodium level in the morning,  #6. Hyperkalemia, resolved with IV fluids, insulin therapy. #7. Rhabdomyolysis, patient received acetate solution drip, improved CK levels , kidney function has improved, urinary output is satisfactory #8. Lactic acidosis, resolved, due to hypoperfusion, septic shock, continue IV fluids . #9 Acute pyelonephritis due to Klebsiella pneumonia, unremarkable renal ultrasound, however MRI of spine revealed renal cysts, obtaining CT scan of abdomen and to rule out malignancy. The patient is to continue cefazolin intravenously, to be changed to Keflex upon discharge to rehabilitation #10. Generalized weakness, continue physical therapy, some improvement, will need to have skilled nursing facility placement due to lower extremity weakness #11. Paraplegia, unremarkable thoracic and lumbar MRI, neurologist, felt it was rhabdomyolysis related, physical therapist recommends skilled nursing facility placement for rehabilitation #12. High fevers of unclear etiology, despite antibiotic use, negative for influenza , questionable malignancy obtaining CT scan of abdomen and Pelvis per Dr. Jarrett Ables recommendations  Management plans discussed with the patient, family and they are in agreement.   DRUG ALLERGIES:  Allergies  Allergen Reactions  . Biaxin [Clarithromycin] Rash    Patient states this medication gives her severe rash and thrush in the mouth.    CODE STATUS:     Code Status Orders        Start     Ordered   04/23/16 1833  Full code   Continuous     04/23/16 1834    Code Status History    Date Active Date Inactive Code Status Order ID Comments User Context   This patient has a current code status but no historical code status.      TOTAL  TIME TAKING CARE OF THIS PATIENT: .    Katharina Caper M.D on 04/27/2016 at 3:03 PM  Between 7am to 6pm - Pager - [phone]  After 6pm go to [email or web address] - password [email or web address]  [place] [facility]  Office  [phone]  CC: Primary care physician; Dortha Kern, MD

## 2016-04-28 DIAGNOSIS — T796XXD Traumatic ischemia of muscle, subsequent encounter: Secondary | ICD-10-CM

## 2016-04-28 LAB — BASIC METABOLIC PANEL
ANION GAP: 8 (ref 5–15)
BUN: 20 mg/dL (ref 6–20)
CO2: 24 mmol/L (ref 22–32)
Calcium: 7.6 mg/dL — ABNORMAL LOW (ref 8.9–10.3)
Chloride: 93 mmol/L — ABNORMAL LOW (ref 101–111)
Creatinine, Ser: 0.79 mg/dL (ref 0.44–1.00)
GFR calc Af Amer: >60 mL/min (ref >60)
GLUCOSE: 94 mg/dL (ref 65–99)
POTASSIUM: 3.6 mmol/L (ref 3.5–5.1)
Sodium: 125 mmol/L — ABNORMAL LOW (ref 135–145)

## 2016-04-28 LAB — CBC
HEMATOCRIT: 28.4 % — AB (ref 35.0–47.0)
HEMOGLOBIN: 9.5 g/dL — AB (ref 12.0–16.0)
MCH: 27.9 pg (ref 26.0–34.0)
MCHC: 33.5 g/dL (ref 32.0–36.0)
MCV: 83.3 fL (ref 80.0–100.0)
Platelets: 201 10*3/uL (ref 150–440)
RBC: 3.41 MIL/uL — ABNORMAL LOW (ref 3.80–5.20)
RDW: 15.8 % — AB (ref 11.5–14.5)
WBC: 10.9 10*3/uL (ref 3.6–11.0)

## 2016-04-28 LAB — GLUCOSE, CAPILLARY
GLUCOSE-CAPILLARY: 111 mg/dL — AB (ref 65–99)
Glucose-Capillary: 107 mg/dL — ABNORMAL HIGH (ref 65–99)
Glucose-Capillary: 101 mg/dL — ABNORMAL HIGH (ref 65–99)
Glucose-Capillary: 105 mg/dL — ABNORMAL HIGH (ref 65–99)

## 2016-04-28 LAB — OSMOLALITY, URINE: Osmolality, Ur: 268 mOsm/kg — ABNORMAL LOW (ref 300–900)

## 2016-04-28 MED ORDER — SODIUM CHLORIDE 0.9 % IV BOLUS (SEPSIS)
1000.0000 mL | Freq: Once | INTRAVENOUS | Status: AC
  Administered 2016-04-28: 09:00:00 1000 mL via INTRAVENOUS

## 2016-04-28 NOTE — Progress Notes (Addendum)
[facility] -  at [facility]   PATIENT NAME: [name]    MR#:  [id number]  DATE OF BIRTH:  08-20-1961  SUBJECTIVE:  CHIEF COMPLAINT:   Chief Complaint  Patient presents with  . Fall  . Hyperglycemia  Patient is 55 year old Caucasian female with past medical history significant for history of diabetes mellitus type 2, essential hypertension, hyperlipidemia who was brought to the hospital with confusion, generalized weakness, fever, nausea and vomiting, decreased urination, decreased oral intake. On arrival to the hospital patient was noted to be in DKA with elevated anion gap, blood glucose levels, hyponatremia, lactic acidosis, rhabdomyolysis. Urinalysis revealed pyuria. She was admitted to the hospital with sepsis, DKA, she was initiated on Rocephin intravenously. She remains febrile with temperature reaching 101 intermittently despite antibiotic therapy, blood pressure Is improved on IV fluids. Urine culture is growing Klebsiella pneumonia, blood cultures also revealed Klebsiella pneumonia. Kidney function is improving with creatinine of 1.09 and estimated GFR of 56 today, up from a GFR of 20, few days ago. . Sodium level has improved. The patient's potassium level normalized. Patient complained of bilateral lower extremity weakness and numbness, especially left lower extremity, which seems to be improving. Patient underwent MRI of thoracic and lumbar spine which was unremarkable, neurologist saw patient in consultation and felt that it was rhabdomyolysis related.  Systolic blood pressure is profoundly low at 86 today, requiring multiple boluses as well as IV fluid advancement, patient remains, however, afebrile, good urinary output. White blood cell count has worsened   Review of Systems  Constitutional: Positive for fever, chills and malaise/fatigue. Negative for weight loss.  HENT: Negative for congestion.   Eyes: Negative for blurred vision and double  vision.  Respiratory: Negative for cough, sputum production, shortness of breath and wheezing.   Cardiovascular: Negative for chest pain, palpitations, orthopnea, leg swelling and PND.  Gastrointestinal: Negative for nausea, vomiting, abdominal pain, diarrhea, constipation and blood in stool.  Genitourinary: Negative for dysuria, urgency, frequency and hematuria.  Musculoskeletal: Negative for falls.  Neurological: Positive for sensory change, focal weakness and weakness. Negative for dizziness, tremors and headaches.  Endo/Heme/Allergies: Does not bruise/bleed easily.  Psychiatric/Behavioral: Negative for depression. The patient does not have insomnia.     VITAL SIGNS: Blood pressure 113/62, pulse 110, temperature 98.9 F (37.2 C), temperature source Oral, resp. rate 18, height 5\' 5"  (1.651 m), weight 137.09 kg (302 lb 3.7 oz), SpO2 98 %.  PHYSICAL EXAMINATION:   GENERAL:  55 y.o.-year-old patient lying in the bed with no acute distress.  Pale, grayish complexion globally weak EYES: Pupils equal, round, reactive to light and accommodation. No scleral icterus. Extraocular muscles intact.  HEENT: Head atraumatic, normocephalic. Oropharynx and nasopharynx clear.  NECK:  Supple, no jugular venous distention. No thyroid enlargement, no tenderness.  LUNGS:  Some diminished breath sounds bilaterally, no wheezing, rales,rhonchi or crepitation, poor effort . No use of accessory muscles of respiration.  CARDIOVASCULAR: S1, S2 normal. No murmurs, rubs, or gallops.  Tachycardic ABDOMEN: Soft,  minimally uncomfortable in suprapubic area, nondistended. Bowel sounds present. No organomegaly or mass.  Comes urine in Foley catheter is light yellow color, cloudy EXTREMITIES: 2+ lower extremity and pedal edema, no cyanosis, or clubbing. Tenderness on palpation in left lower extremity, diffusely. Left lateral ankle ulceration with purulent drainage, dressed. Right posterior upper thigh deep wound, per wound care  specialist, unable to see it.  NEUROLOGIC: Cranial nerves II through XII are intact. Muscle strength 3/5 in both lower extremities,  better in the right lower extremity, withdraws from painful stimuli. Sensation to light touch is decreased in both legs, but better in the right lower extremity.  Gait not checked. Palpation of the left lower extremity muscles is painful, but no fluctuations were noted and no focal point of tenderness PSYCHIATRIC: The patient is alert and oriented x 3.  SKIN: No obvious rash, lesion, or ulcer.   ORDERS/RESULTS REVIEWED:   CBC  Recent Labs Lab 04/23/16 1641 04/23/16 2020 04/24/16 0439 04/25/16 0600 04/26/16 0538 04/28/16 0506  WBC 9.6 6.5 5.3 5.7 6.0 10.9  HGB 10.6* 8.4* 10.1* 9.6* 9.6* 9.5*  HCT 32.7* 25.3* 30.0* 28.5* 27.8* 28.4*  PLT 203 152 168 134* 151 201  MCV 86.1 84.0 82.9 81.4 82.3 83.3  MCH 27.9 27.8 27.8 27.5 28.3 27.9  MCHC 32.4 33.1 33.6 33.7 34.3 33.5  RDW 16.0* 15.9* 15.8* 15.7* 15.9* 15.8*  LYMPHSABS 0.5*  --   --   --   --   --   MONOABS 0.3  --   --   --   --   --   EOSABS 0.0  --   --   --   --   --   BASOSABS 0.0  --   --   --   --   --    ------------------------------------------------------------------------------------------------------------------  Chemistries   Recent Labs Lab 04/23/16 1641  04/24/16 2322 04/25/16 0600 04/26/16 0538 04/27/16 0755 04/28/16 0506  NA 117*  < > 131* 131* 132* 129* 125*  K 5.2*  < > 3.0* 3.3* 3.9 3.7 3.6  CL 82*  < > 95* 93* 97* 96* 93*  CO2 18*  < > 23 25 27 23 24   GLUCOSE 788*  < > 131* 282* 178* 194* 94  BUN 99*  < > 71* 67* 43* 28* 20  CREATININE 3.10*  < > 1.83* 1.64* 1.09* 0.90 0.79  CALCIUM 8.5*  < > 7.6* 7.3* 7.5* 7.6* 7.6*  MG  --   --   --   --  1.8  --   --   AST 90*  --   --   --   --   --   --   ALT 45  --   --   --   --   --   --   ALKPHOS 61  --   --   --   --   --   --   BILITOT 0.9  --   --   --   --   --   --   < > = values in this interval not  displayed. ------------------------------------------------------------------------------------------------------------------ estimated creatinine clearance is 112.9 mL/min (by C-G formula based on Cr of 0.79). ------------------------------------------------------------------------------------------------------------------ No results for input(s): TSH, T4TOTAL, T3FREE, THYROIDAB in the last 72 hours.  Invalid input(s): FREET3  Cardiac Enzymes  Recent Labs Lab 04/23/16 1641  TROPONINI 0.03   ------------------------------------------------------------------------------------------------------------------ Invalid input(s): POCBNP ---------------------------------------------------------------------------------------------------------------  RADIOLOGY: Ct Abdomen Pelvis W Contrast  04/27/2016  CLINICAL DATA:  Sepsis, possible renal cysts on lumbar spine MRI EXAM: CT ABDOMEN AND PELVIS WITH CONTRAST TECHNIQUE: Multidetector CT imaging of the abdomen and pelvis was performed using the standard protocol following bolus administration of intravenous contrast. CONTRAST:  ISOVUE-300 IOPAMIDOL (ISOVUE-300) INJECTION 61% COMPARISON:  Lumbar spine MRI dated 04/25/2016 FINDINGS: Lower chest:  Mild scarring/ atelectasis at the left lung base. Hepatobiliary: Liver is within normal limits. No suspicious/enhancing hepatic lesions. Gallbladder is unremarkable. No intrahepatic or extrahepatic ductal dilatation.  Pancreas: Within normal limits. Spleen: Within normal limits. Adrenals/Urinary Tract: Adrenal glands are within normal limits. Right kidney is within normal limits. Left kidney is notable for a 3.1 x 2.6 cm simple cyst along the medial left upper pole (series 2/ image 36), corresponding to the MRI abnormality. Additional 2.3 cm simple cyst along the anterior left lower kidney (series 2/image 55). However, there is multifocal heterogeneous perfusion of the upper pole and interpolar region in the left  kidney, with possible multiple underlying lesions. For example, there is a possible 3.5 x 4.1 cm lesion anteriorly in the left upper pole (series 2/ image 39), a 2.8 x 2.8 cm lesion in the posterior interpolar region (series 2/ image 42), two additional smaller lesions in the posterior interpolar left kidney (series 2/ image 42), and a possible 2.1 x 2.5 cm lesion in the posterior left lower kidney (series 2/ image 46). Overall, this appearance suggests pyelonephritis or a neoplastic process such as lymphoma. No hydronephrosis. Bladder is notable for mild left bladder wall thickening (series 2/image 84). Stomach/Bowel: Stomach is within normal limits. No evidence of bowel obstruction. Normal appendix (series 2/image 67). Vascular/Lymphatic: Atherosclerotic calcifications of the abdominal aorta and branch vessels. No evidence of abdominal aortic aneurysm. Small left retroperitoneal lymph nodes, including a mildly enlarged 15 mm short axis left para-aortic node (series 2/ image 44). Reproductive: Uterus is within normal limits. Bilateral ovaries are unremarkable. Other: Small volume pelvic ascites. Musculoskeletal: Mild degenerative changes of the lower thoracic spine. IMPRESSION: MRI abnormality corresponds to a 3.1 cm simple cyst in the medial left upper kidney. Multifocal heterogeneous perfusion of the left kidney with possible multiple underlying lesions, measuring up to 4.1 cm, as above. Differential considerations include pyelonephritis or a neoplastic process such as lymphoma. Associated 15 mm short axis left para-aortic node. Correlate for signs/symptoms of pyelonephritis. Follow-up CT or MRI abdomen with/without contrast is suggested in 4 weeks. Electronically Signed   By: Charline Bills M.D.   On: 04/27/2016 18:14    EKG:  Orders placed or performed during the hospital encounter of 04/23/16  . ED EKG  . ED EKG    ASSESSMENT AND PLAN:  Active Problems:   DKA (diabetic ketoacidosis) (HCC)    Pressure ulcer #1. Sepsis due to Klebsiella pneumoniae, due to acute pyelonephritis 2/2 Klebsiella pneumoniae. Continue Cefazolin Intravenously, changed from Rocephin per Dr. Jarrett Ables recommendations per sensitivity results. Change to oral Keflex prior to discharge skilled nursing facility for rehabilitation. Blood pressure has Dropped to 70s to 80s today, advance IV fluid maintenance and bolus, repeat as needed. Follow blood pressure readings closely.  #2. Septic shock, advanced IV fluids, patient required IV fluid bolus due to profound hypotension. Blood pressure has improved with therapy, remains afebrile, IV antibiotics have not been changed at this time. CT of abdomen and pelvis with IV contrast revealed simple cyst in left kidney, multifocal heterogeneous perfusion in The left kidney concerning for multiple underlying lesions, due to pyelonephritis versus  neoplastic process, follow-up CT was recommended as outpatient, to be ordered by urologist or primary care physician.  #3. Acute renal failure likely due to ATN, improved on IV fluids. Nephrology consultation is appreciated, renal ultrasound was unremarkable, however MRI revealed a left renal cyst, CT of abdomen and pelvis with contrast was performed, revealing simple cyst in left kidney, multifocal heterogeneous perfusion in The left kidney concerning for multiple underlying lesions, due to pyelonephritis versus  neoplastic process, follow-up CT was recommended as outpatient, to be ordered by urologist or  primary care physician.  #4 DKA due to poor diabetes control at home with hemoglobin A1c 11.1, blood glucose around 100-120, now on insulin Lantus at 42 units, following blood glucose levels closely, continue current therapy.   #5. Hyponatremia, worsened today, likely due to poor oral intake, as per patient, some dehydration, advancing IV fluids, Recheck sodium level in the morning, urinary osmolality to rule out SIADH #6. Hyperkalemia, resolved  with IV fluids, insulin therapy. #7. Rhabdomyolysis, patient received acetate solution drip, improved CK levels , kidney function has improved, urinary output is satisfactory #8. Lactic acidosis, resolved, due to hypoperfusion, septic shock, continue IV fluids . #9 Acute pyelonephritis due to Klebsiella pneumonia, unremarkable renal ultrasound, however MRI of spine revealed renal cysts, CT scan of abdomen and pelvis revealed heterogeneous left kidney perfusion, CT scan will need to be repeated as outpatient to rule out neoplasm . The patient is to continue cefazolin intravenously, to be changed to Keflex upon discharge to rehabilitation #10. Generalized weakness, continue physical therapy, some improvement, will need to have skilled nursing facility placement due to lower extremity weakness #11. Paraplegia, unremarkable thoracic and lumbar MRI, neurologist, felt it was rhabdomyolysis related, physical therapist recommends skilled nursing facility placement for rehabilitation, likely tomorrow if remains afebrile and stable. Blood pressure readings Management plans discussed with the patient, family and they are in agreement.   DRUG ALLERGIES:  Allergies  Allergen Reactions  . Biaxin [Clarithromycin] Rash    Patient states this medication gives her severe rash and thrush in the mouth.    CODE STATUS:     Code Status Orders        Start     Ordered   04/23/16 1833  Full code   Continuous     04/23/16 1834    Code Status History    Date Active Date Inactive Code Status Order ID Comments User Context   This patient has a current code status but no historical code status.      TOTAL  Critical care TIME TAKING CARE OF THIS PATIENT:  40 minutes.   Katharina Caper M.D on 04/28/2016 at 4:21 PM  Between 7am to 6pm - Pager - [phone]  After 6pm go to [email or web address] - password [email or web address]  [place] [facility]  Office  [phone]  CC: Primary care physician; Dortha Kern, MD

## 2016-04-28 NOTE — Progress Notes (Signed)
ID E note Had CT done and abnormalities noted  Would rec a 21 day course of abx from time of admission. Would rec follow up with urology to repeat imaging of the renal lesion.

## 2016-04-28 NOTE — Progress Notes (Signed)
Subjective: Patient reports no significant change from yesterday.    Objective: Current vital signs: BP 126/57 mmHg  Pulse 104  Temp(Src) 98.8 F (37.1 C) (Oral)  Resp 18  Ht 5\' 5"  (1.651 m)  Wt 137.09 kg (302 lb 3.7 oz)  BMI 50.29 kg/m2  SpO2 95% Vital signs in last 24 hours: Temp:  [97.8 F (36.6 C)-99.4 F (37.4 C)] 98.8 F (37.1 C) (05/04 0940) Pulse Rate:  [98-119] 104 (05/04 0940) Resp:  [16-18] 18 (05/04 0526) BP: (89-126)/(57-70) 126/57 mmHg (05/04 0940) SpO2:  [95 %-100 %] 95 % (05/04 0940) Weight:  [137.09 kg (302 lb 3.7 oz)] 137.09 kg (302 lb 3.7 oz) (05/04 0500)  Intake/Output from previous day: 05/03 0701 - 05/04 0700 In: 2940 [P.O.:240; I.V.:2550; IV Piggyback:150] Out: 1250 [Urine:1250] Intake/Output this shift:   Nutritional status: Diet Carb Modified Fluid consistency:: Thin; Room service appropriate?: Yes  Neurologic Exam: Mental Status: Alert, oriented, thought content appropriate. Speech fluent without evidence of aphasia. Able to follow 3 step commands without difficulty. Cranial Nerves: II: Discs flat bilaterally; Visual fields grossly normal, pupils equal, round, reactive to light and accommodation III,IV, VI: ptosis not present, extra-ocular motions intact bilaterally V,VII: smile symmetric, facial light touch sensation normal bilaterally VIII: hearing normal bilaterally IX,X: gag reflex present XI: bilateral shoulder shrug XII: midline tongue extension Motor: Right :Upper extremity 5/5Left: Upper extremity 5/5 Patient can lift each leg off the bed 5-6 inches. Continues to be able to flex at the knee at about 3/5. Flicker of ankle flexion noted on the left Sensory: Continued altered sensation, mostly in the toes.    Lab Results: Basic Metabolic Panel:  Recent Labs Lab 04/24/16 2322 04/25/16 0600 04/26/16 0538 04/27/16 0755 04/28/16 0506  NA 131* 131* 132* 129* 125*  K 3.0* 3.3*  3.9 3.7 3.6  CL 95* 93* 97* 96* 93*  CO2 23 25 27 23 24   GLUCOSE 131* 282* 178* 194* 94  BUN 71* 67* 43* 28* 20  CREATININE 1.83* 1.64* 1.09* 0.90 0.79  CALCIUM 7.6* 7.3* 7.5* 7.6* 7.6*  MG  --   --  1.8  --   --     Liver Function Tests:  Recent Labs Lab 04/23/16 1641  AST 90*  ALT 45  ALKPHOS 61  BILITOT 0.9  PROT 7.4  ALBUMIN 2.7*   No results for input(s): LIPASE, AMYLASE in the last 168 hours. No results for input(s): AMMONIA in the last 168 hours.  CBC:  Recent Labs Lab 04/23/16 1641 04/23/16 2020 04/24/16 0439 04/25/16 0600 04/26/16 0538 04/28/16 0506  WBC 9.6 6.5 5.3 5.7 6.0 10.9  NEUTROABS 8.8*  --   --   --   --   --   HGB 10.6* 8.4* 10.1* 9.6* 9.6* 9.5*  HCT 32.7* 25.3* 30.0* 28.5* 27.8* 28.4*  MCV 86.1 84.0 82.9 81.4 82.3 83.3  PLT 203 152 168 134* 151 201    Cardiac Enzymes:  Recent Labs Lab 04/23/16 1641 04/23/16 1642 04/23/16 2020 04/24/16 0730 04/25/16 0600 04/26/16 0538  CKTOTAL  --  5665* 3670* 2934* 1395* 563*  TROPONINI 0.03  --   --   --   --   --     Lipid Panel: No results for input(s): CHOL, TRIG, HDL, CHOLHDL, VLDL, LDLCALC in the last 168 hours.  CBG:  Recent Labs Lab 04/27/16 0730 04/27/16 1105 04/27/16 1727 04/27/16 2136 04/28/16 0735  GLUCAP 183* 283* 123* 121* 107*    Microbiology: Results for orders placed or performed  during the hospital encounter of 04/23/16  Blood culture (routine x 2)     Status: Abnormal   Collection Time: 04/23/16  4:35 PM  Result Value Ref Range Status   Specimen Description BLOOD RIGHT ANTECUBITAL  Final   Special Requests   Final    BOTTLES DRAWN AEROBIC AND ANAEROBIC  AER 7CC ANA 1CC   Culture  Setup Time   Final    GRAM NEGATIVE RODS IN BOTH AEROBIC AND ANAEROBIC BOTTLES CRITICAL RESULT CALLED TO, READ BACK BY AND VERIFIED WITH: Morton Stall @ [phone] 04/24/16 by [facility]    Culture (A)  Final    KLEBSIELLA PNEUMONIAE IN BOTH AEROBIC AND ANAEROBIC BOTTLES    Report Status  04/26/2016 FINAL  Final   Organism ID, Bacteria KLEBSIELLA PNEUMONIAE  Final      Susceptibility   Klebsiella pneumoniae - MIC*    AMPICILLIN Value in next row Resistant      RESISTANT>=32    CEFAZOLIN Value in next row Sensitive      SENSITIVE<=4    CEFEPIME Value in next row Sensitive      SENSITIVE<=1    CEFTAZIDIME Value in next row Sensitive      SENSITIVE<=1    CEFTRIAXONE Value in next row Sensitive      SENSITIVE<=1    CIPROFLOXACIN Value in next row Sensitive      SENSITIVE<=0.25    GENTAMICIN Value in next row Sensitive      SENSITIVE<=1    IMIPENEM Value in next row Sensitive      SENSITIVE<=0.25    TRIMETH/SULFA Value in next row Sensitive      SENSITIVE<=20    AMPICILLIN/SULBACTAM Value in next row Sensitive      SENSITIVE8    PIP/TAZO Value in next row Sensitive      SENSITIVE<=4    Extended ESBL Value in next row Sensitive      SENSITIVE<=4    * KLEBSIELLA PNEUMONIAE  Blood Culture ID Panel (Reflexed)     Status: Abnormal   Collection Time: 04/23/16  4:35 PM  Result Value Ref Range Status   Enterococcus species NOT DETECTED NOT DETECTED Final   Vancomycin resistance NOT DETECTED NOT DETECTED Final   Listeria monocytogenes NOT DETECTED NOT DETECTED Final   Staphylococcus species NOT DETECTED NOT DETECTED Final   Staphylococcus aureus NOT DETECTED NOT DETECTED Final   Methicillin resistance NOT DETECTED NOT DETECTED Final   Streptococcus species NOT DETECTED NOT DETECTED Final   Streptococcus agalactiae NOT DETECTED NOT DETECTED Final   Streptococcus pneumoniae NOT DETECTED NOT DETECTED Final   Streptococcus pyogenes NOT DETECTED NOT DETECTED Final   Acinetobacter baumannii NOT DETECTED NOT DETECTED Final   Enterobacteriaceae species DETECTED (A) NOT DETECTED Final    Comment: CRITICAL RESULT CALLED TO, READ BACK BY AND VERIFIED WITH: Nate Cookson @ [phone] 04/24/16 by [facility]    Enterobacter cloacae complex NOT DETECTED NOT DETECTED Final   Escherichia coli NOT  DETECTED NOT DETECTED Final   Klebsiella oxytoca NOT DETECTED NOT DETECTED Final   Klebsiella pneumoniae DETECTED (A) NOT DETECTED Final    Comment: CRITICAL RESULT CALLED TO, READ BACK BY AND VERIFIED WITH: Nate Cookson @ [phone] 04/24/16 by [facility]    Proteus species NOT DETECTED NOT DETECTED Final   Serratia marcescens NOT DETECTED NOT DETECTED Final   Carbapenem resistance NOT DETECTED NOT DETECTED Final   Haemophilus influenzae NOT DETECTED NOT DETECTED Final   Neisseria meningitidis NOT DETECTED NOT DETECTED Final   Pseudomonas aeruginosa  NOT DETECTED NOT DETECTED Final   Candida albicans NOT DETECTED NOT DETECTED Final   Candida glabrata NOT DETECTED NOT DETECTED Final   Candida krusei NOT DETECTED NOT DETECTED Final   Candida parapsilosis NOT DETECTED NOT DETECTED Final   Candida tropicalis NOT DETECTED NOT DETECTED Final  Blood culture (routine x 2)     Status: Abnormal   Collection Time: 04/23/16  4:41 PM  Result Value Ref Range Status   Specimen Description BLOOD RIGHT ARM  Final   Special Requests   Final    BOTTLES DRAWN AEROBIC AND ANAEROBIC  AER 6CC ANA 2CC   Culture  Setup Time   Final    GRAM NEGATIVE RODS IN BOTH AEROBIC AND ANAEROBIC BOTTLES CRITICAL VALUE NOTED.  VALUE IS CONSISTENT WITH PREVIOUSLY REPORTED AND CALLED VALUE.    Culture (A)  Final    KLEBSIELLA PNEUMONIAE IN BOTH AEROBIC AND ANAEROBIC BOTTLES    Report Status 04/26/2016 FINAL  Final   Organism ID, Bacteria KLEBSIELLA PNEUMONIAE  Final      Susceptibility   Klebsiella pneumoniae - MIC*    AMPICILLIN >=32 RESISTANT Resistant     CEFAZOLIN <=4 SENSITIVE Sensitive     CEFEPIME <=1 SENSITIVE Sensitive     CEFTAZIDIME <=1 SENSITIVE Sensitive     CEFTRIAXONE <=1 SENSITIVE Sensitive     CIPROFLOXACIN <=0.25 SENSITIVE Sensitive     GENTAMICIN <=1 SENSITIVE Sensitive     IMIPENEM <=0.25 SENSITIVE Sensitive     TRIMETH/SULFA <=20 SENSITIVE Sensitive     AMPICILLIN/SULBACTAM 8 SENSITIVE Sensitive      PIP/TAZO <=4 SENSITIVE Sensitive     Extended ESBL NEGATIVE Sensitive     * KLEBSIELLA PNEUMONIAE  Urine culture     Status: Abnormal   Collection Time: 04/23/16  4:41 PM  Result Value Ref Range Status   Specimen Description URINE, CLEAN CATCH  Final   Special Requests NONE  Final   Culture >=100,000 COLONIES/mL KLEBSIELLA PNEUMONIAE (A)  Final   Report Status 04/25/2016 FINAL  Final   Organism ID, Bacteria KLEBSIELLA PNEUMONIAE (A)  Final      Susceptibility   Klebsiella pneumoniae - MIC*    AMPICILLIN >=32 RESISTANT Resistant     CEFAZOLIN <=4 SENSITIVE Sensitive     CEFTRIAXONE <=1 SENSITIVE Sensitive     CIPROFLOXACIN <=0.25 SENSITIVE Sensitive     GENTAMICIN <=1 SENSITIVE Sensitive     IMIPENEM <=0.25 SENSITIVE Sensitive     NITROFURANTOIN 32 SENSITIVE Sensitive     TRIMETH/SULFA <=20 SENSITIVE Sensitive     AMPICILLIN/SULBACTAM 8 SENSITIVE Sensitive     PIP/TAZO <=4 SENSITIVE Sensitive     Extended ESBL NEGATIVE Sensitive     * >=100,000 COLONIES/mL KLEBSIELLA PNEUMONIAE  MRSA PCR Screening     Status: Abnormal   Collection Time: 04/23/16  9:06 PM  Result Value Ref Range Status   MRSA by PCR POSITIVE (A) NEGATIVE Final    Comment:        The GeneXpert MRSA Assay (FDA approved for NASAL specimens only), is one component of a comprehensive MRSA colonization surveillance program. It is not intended to diagnose MRSA infection nor to guide or monitor treatment for MRSA infections. CRITICAL RESULT CALLED TO, READ BACK BY AND VERIFIED WITH: Cristan Scherzer LEWIS AT 2312 04/23/16.PMH     Coagulation Studies: No results for input(s): LABPROT, INR in the last 72 hours.  Imaging: Ct Abdomen Pelvis W Contrast  04/27/2016  CLINICAL DATA:  Sepsis, possible renal cysts on lumbar spine MRI EXAM:  CT ABDOMEN AND PELVIS WITH CONTRAST TECHNIQUE: Multidetector CT imaging of the abdomen and pelvis was performed using the standard protocol following bolus administration of intravenous contrast.  CONTRAST:  ISOVUE-300 IOPAMIDOL (ISOVUE-300) INJECTION 61% COMPARISON:  Lumbar spine MRI dated 04/25/2016 FINDINGS: Lower chest:  Mild scarring/ atelectasis at the left lung base. Hepatobiliary: Liver is within normal limits. No suspicious/enhancing hepatic lesions. Gallbladder is unremarkable. No intrahepatic or extrahepatic ductal dilatation. Pancreas: Within normal limits. Spleen: Within normal limits. Adrenals/Urinary Tract: Adrenal glands are within normal limits. Right kidney is within normal limits. Left kidney is notable for a 3.1 x 2.6 cm simple cyst along the medial left upper pole (series 2/ image 36), corresponding to the MRI abnormality. Additional 2.3 cm simple cyst along the anterior left lower kidney (series 2/image 55). However, there is multifocal heterogeneous perfusion of the upper pole and interpolar region in the left kidney, with possible multiple underlying lesions. For example, there is a possible 3.5 x 4.1 cm lesion anteriorly in the left upper pole (series 2/ image 39), a 2.8 x 2.8 cm lesion in the posterior interpolar region (series 2/ image 42), two additional smaller lesions in the posterior interpolar left kidney (series 2/ image 42), and a possible 2.1 x 2.5 cm lesion in the posterior left lower kidney (series 2/ image 46). Overall, this appearance suggests pyelonephritis or a neoplastic process such as lymphoma. No hydronephrosis. Bladder is notable for mild left bladder wall thickening (series 2/image 84). Stomach/Bowel: Stomach is within normal limits. No evidence of bowel obstruction. Normal appendix (series 2/image 67). Vascular/Lymphatic: Atherosclerotic calcifications of the abdominal aorta and branch vessels. No evidence of abdominal aortic aneurysm. Small left retroperitoneal lymph nodes, including a mildly enlarged 15 mm short axis left para-aortic node (series 2/ image 44). Reproductive: Uterus is within normal limits. Bilateral ovaries are unremarkable. Other: Small  volume pelvic ascites. Musculoskeletal: Mild degenerative changes of the lower thoracic spine. IMPRESSION: MRI abnormality corresponds to a 3.1 cm simple cyst in the medial left upper kidney. Multifocal heterogeneous perfusion of the left kidney with possible multiple underlying lesions, measuring up to 4.1 cm, as above. Differential considerations include pyelonephritis or a neoplastic process such as lymphoma. Associated 15 mm short axis left para-aortic node. Correlate for signs/symptoms of pyelonephritis. Follow-up CT or MRI abdomen with/without contrast is suggested in 4 weeks. Electronically Signed   By: Charline Bills M.D.   On: 04/27/2016 18:14    Medications:  I have reviewed the patient's current medications. Scheduled: . calcium citrate-vitamin D  1 tablet Oral BID  .  ceFAZolin (ANCEF) IV  2 g Intravenous Q8H  . docusate sodium  100 mg Oral BID  . enoxaparin (LOVENOX) injection  40 mg Subcutaneous Q12H  . famotidine  20 mg Oral BID  . insulin aspart  0-15 Units Subcutaneous TID WC  . insulin aspart  0-5 Units Subcutaneous QHS  . insulin aspart  6 Units Subcutaneous TID WC  . insulin glargine  42 Units Subcutaneous Q24H  . mupirocin ointment  1 application Nasal BID  . nicotine  21 mg Transdermal Daily  . nystatin  5 mL Oral QID  . sodium chloride  1,000 mL Intravenous Once    Assessment/Plan: Continued lower extremity symptoms.  Metabolic abnormalities persist as well.  Will continue to follow clinically as metabolic abnormalities continue to be addressed.     LOS: 5 days   Thana Farr, MD Neurology [phone] 04/28/2016  10:06 AM

## 2016-04-28 NOTE — Progress Notes (Signed)
Physical Therapy Treatment Patient Details Name: Makaria Poarch Bumpus MRN: [id number] DOB: [birth year] Today's Date: 04/28/2016    History of Present Illness Danyla Mcleish is a 55 y.o. female with a known history of diabetes mellitus type 2, hypertension, hyperlipidemia brought in by family for confusion, generalized weakness. Patient was admitted with hyperglycemia with blood sugar levels >600. She also has sepsis and rhabdomyolysis.     PT Comments    Pt fatigued this morning, notes not feeling too well. Bilateral lower extremities feel the same subjectively in regards to sensation and strength. It is noted that Right lower extremity foot is warm to the touch today. Pt participates in active assisted range of motion and active range of motion supine bed exercises. Pt encouraged to perform several times per day. Continue PT to progress strength and endurance to improve functional mobility.   Follow Up Recommendations  SNF     Equipment Recommendations       Recommendations for Other Services       Precautions / Restrictions Restrictions Weight Bearing Restrictions: No    Mobility  Bed Mobility                  Transfers                    Ambulation/Gait                 Stairs            Wheelchair Mobility    Modified Rankin (Stroke Patients Only)       Balance                                    Cognition Arousal/Alertness: Lethargic Behavior During Therapy: WFL for tasks assessed/performed Overall Cognitive Status: Within Functional Limits for tasks assessed                      Exercises General Exercises - Lower Extremity Ankle Circles/Pumps: PROM;Both;20 reps;Supine Quad Sets: Strengthening;Both;20 reps;Supine Gluteal Sets: Strengthening;Both;20 reps;Supine Short Arc Quad: AROM;Both;20 reps;Supine Heel Slides: AROM;Both;20 reps;Supine Hip ABduction/ADduction: AROM;Both;20 reps;Supine Straight Leg Raises:  AROM;Both;10 reps;Supine    General Comments        Pertinent Vitals/Pain      Home Living                      Prior Function            PT Goals (current goals can now be found in the care plan section) Progress towards PT goals: Progressing toward goals    Frequency  Min 2X/week    PT Plan Current plan remains appropriate    Co-evaluation             End of Session   Activity Tolerance: Patient limited by fatigue;Patient limited by lethargy Patient left: in bed;with call bell/phone within reach;with bed alarm set     Time: 5686-1683 PT Time Calculation (min) (ACUTE ONLY): 25 min  Charges:  $Therapeutic Exercise: 23-37 mins                    G Codes:      Kristeen Miss, PTA 04/28/2016, 10:15 AM

## 2016-04-28 NOTE — Progress Notes (Signed)
Per [facility] authorization has been received and is good for 3-4 days. Per MD patient is not medically stable for D/C today. Clinical Social Worker (CSW) met with patient and made her aware of above. CSW will continue to follow and assist as needed.   Blima Rich, LCSW [phone]

## 2016-04-28 NOTE — Progress Notes (Signed)
Pharmacy Antibiotic Note - Day 6/21  [name] is a 55 y.o. female admitted on 04/23/2016 with bacteremia.  Pharmacy has been consulted for cefazolin dosing.  Plan: Will continue patient on cefazolin 2g IV Q8hr.    Height: 5\' 5"  (165.1 cm) Weight: (!) 302 lb 3.7 oz (137.09 kg) IBW/kg (Calculated) : 57  Temp (24hrs), Avg:98.6 F (37 C), Min:97.8 F (36.6 C), Max:99.4 F (37.4 C)   Recent Labs Lab 04/23/16 1641 04/23/16 2020  04/24/16 0439  04/24/16 2322 04/25/16 0600 04/26/16 0538 04/27/16 0755 04/28/16 0506  WBC 9.6 6.5  --  5.3  --   --  5.7 6.0  --  10.9  CREATININE 3.10* 2.36*  < > 2.53*  < > 1.83* 1.64* 1.09* 0.90 0.79  LATICACIDVEN 2.2* 2.8*  --   --   --   --   --   --   --   --   < > = values in this interval not displayed.  Estimated Creatinine Clearance: 112.9 mL/min (by C-G formula based on Cr of 0.79).    Allergies  Allergen Reactions  . Biaxin [Clarithromycin] Rash    Patient states this medication gives her severe rash and thrush in the mouth.    Antimicrobials this admission: Vancomycin 4/29>>4/29 Zosyn 4/29>>4/29 Ceftriaxone 4/29>>5/03 Cefazolin 5/05>>  Microbiology results: 4/29 Blood Cultures x 2: Klebsiella Pneumoniae, Sensitive to Cefazolin 4/29 Urine Culture: Klebsiella Pneumoniae, Sensitive to Cefazolin 4/29 MRSA PCR: Positive  Pharmacy will continue to monitor and adjust per consult.    [name] 04/28/2016 9:31 AM

## 2016-04-28 NOTE — Plan of Care (Signed)
Problem: Physical Regulation: Goal: Ability to maintain clinical measurements within normal limits will improve Outcome: Progressing Remains on IV Antibiotics.  Problem: Skin Integrity: Goal: Risk for impaired skin integrity will decrease Outcome: Progressing Followed by Wound Care, RN. Followed by I.D., MD.  Problem: Activity: Goal: Risk for activity intolerance will decrease Outcome: Progressing Followed by PT.  Problem: Skin Integrity: Goal: Risk for impaired skin integrity will decrease Outcome: Progressing Followed by Wound Care RN. Followed by I.D., MD.

## 2016-04-28 NOTE — Progress Notes (Signed)
Inpatient Diabetes Program Recommendations  AACE/ADA: New Consensus Statement on Inpatient Glycemic Control (2015)  Target Ranges:  Prepandial:   less than 140 mg/dL      Peak postprandial:   less than 180 mg/dL (1-2 hours)      Critically ill patients:  140 - 180 mg/dL   Review of Glycemic Control  Results for RAINE, BLODGETT (MRN [id number]) as of 04/28/2016 08:00  Ref. Range 04/27/2016 07:30 04/27/2016 11:05 04/27/2016 17:27 04/27/2016 21:36 04/28/2016 07:35  Glucose-Capillary Latest Ref Range: 65-99 mg/dL 081 (H) 448 (H) 185 (H) 121 (H) 107 (H)    Diabetes history: Type 2 diabetes Outpatient Diabetes medications: Januvia 100 mg daily, Metformin XR 1000 mg bid, Actos 45 mg daily, Victoza 1.8 mg daily Current orders for Inpatient glycemic control: Lantus 42 units daily, Novolog moderate 0-15 units tid with meals, Novolog 0-5 units qhs, Novolog 6 units tid with meals  Inpatient Diabetes Program Recommendations:  Agree with current diabetes medication orders.  Susette Racer, RN, BA, MHA, CDE Diabetes Coordinator Inpatient Diabetes Program  [phone] (Team Pager) [phone] [facility] Office) 04/28/2016 8:01 AM

## 2016-04-29 ENCOUNTER — Inpatient Hospital Stay: Payer: BLUE CROSS/BLUE SHIELD

## 2016-04-29 LAB — COMPREHENSIVE METABOLIC PANEL WITH GFR
ALT: 29 U/L (ref 14–54)
AST: 57 U/L — ABNORMAL HIGH (ref 15–41)
Albumin: 1.5 g/dL — ABNORMAL LOW (ref 3.5–5.0)
Alkaline Phosphatase: 156 U/L — ABNORMAL HIGH (ref 38–126)
Anion gap: 9 (ref 5–15)
BUN: 13 mg/dL (ref 6–20)
CO2: 22 mmol/L (ref 22–32)
Calcium: 7.8 mg/dL — ABNORMAL LOW (ref 8.9–10.3)
Chloride: 94 mmol/L — ABNORMAL LOW (ref 101–111)
Creatinine, Ser: 0.84 mg/dL (ref 0.44–1.00)
GFR calc Af Amer: >60 mL/min
GFR calc non Af Amer: >60 mL/min
Glucose, Bld: 117 mg/dL — ABNORMAL HIGH (ref 65–99)
Potassium: 4 mmol/L (ref 3.5–5.1)
Sodium: 125 mmol/L — ABNORMAL LOW (ref 135–145)
Total Bilirubin: 0.5 mg/dL (ref 0.3–1.2)
Total Protein: 5.3 g/dL — ABNORMAL LOW (ref 6.5–8.1)

## 2016-04-29 LAB — SODIUM: Sodium: 130 mmol/L — ABNORMAL LOW (ref 135–145)

## 2016-04-29 LAB — MAGNESIUM: MAGNESIUM: 1.4 mg/dL — AB (ref 1.7–2.4)

## 2016-04-29 LAB — GLUCOSE, CAPILLARY
GLUCOSE-CAPILLARY: 88 mg/dL (ref 65–99)
Glucose-Capillary: 130 mg/dL — ABNORMAL HIGH (ref 65–99)
Glucose-Capillary: 126 mg/dL — ABNORMAL HIGH (ref 65–99)
Glucose-Capillary: 126 mg/dL — ABNORMAL HIGH (ref 65–99)

## 2016-04-29 MED ORDER — METOPROLOL TARTRATE 25 MG PO TABS
25.0000 mg | ORAL_TABLET | Freq: Two times a day (BID) | ORAL | Status: DC
  Administered 2016-04-29 – 2016-05-02 (×6): 25 mg via ORAL
  Filled 2016-04-29 (×6): qty 1

## 2016-04-29 MED ORDER — SILVER SULFADIAZINE 1 % EX CREA
TOPICAL_CREAM | Freq: Every day | CUTANEOUS | Status: DC
  Administered 2016-04-29 – 2016-05-02 (×4): via TOPICAL
  Filled 2016-04-29 (×2): qty 20

## 2016-04-29 MED ORDER — MAGNESIUM OXIDE 400 (241.3 MG) MG PO TABS
400.0000 mg | ORAL_TABLET | Freq: Every day | ORAL | Status: AC
  Administered 2016-04-29 – 2016-04-30 (×2): 400 mg via ORAL
  Filled 2016-04-29 (×2): qty 1

## 2016-04-29 MED ORDER — POTASSIUM CHLORIDE IN NACL 20-0.9 MEQ/L-% IV SOLN
INTRAVENOUS | Status: DC
  Administered 2016-04-29 – 2016-04-30 (×2): via INTRAVENOUS
  Filled 2016-04-29 (×5): qty 1000

## 2016-04-29 MED ORDER — DOXYCYCLINE HYCLATE 100 MG PO TABS
100.0000 mg | ORAL_TABLET | Freq: Two times a day (BID) | ORAL | Status: DC
  Administered 2016-04-29 – 2016-05-02 (×7): 100 mg via ORAL
  Filled 2016-04-29 (×7): qty 1

## 2016-04-29 MED ORDER — MAGNESIUM SULFATE 4 GM/100ML IV SOLN
4.0000 g | Freq: Once | INTRAVENOUS | Status: AC
  Administered 2016-04-29: 18:00:00 4 g via INTRAVENOUS
  Filled 2016-04-29: qty 100

## 2016-04-29 NOTE — Progress Notes (Signed)
[facility] INFECTIOUS DISEASE PROGRESS NOTE Date of Admission:  04/23/2016     ID: [name] is a 55 y.o. female with   Pyelonephritis,  Active Problems:   DKA (diabetic ketoacidosis) (HCC)   Pressure ulcer   Subjective: Developed  Worsening pain LLE and redness. Has a wound there as well. Still very weak. No fevers.  WBC 10  ROS  Eleven systems are reviewed and negative except per hpi  Medications:  Antibiotics Given (last 72 hours)    Date/Time Action Medication Dose Rate   04/27/16 0600 Given   cefTRIAXone (ROCEPHIN) 2 g in dextrose 5 % 50 mL IVPB 2 g 100 mL/hr   04/28/16 0511 Given   ceFAZolin (ANCEF) IVPB 2g/100 mL premix 2 g 200 mL/hr   04/28/16 1413 Given   ceFAZolin (ANCEF) IVPB 2g/100 mL premix 2 g 200 mL/hr   04/28/16 2147 Given   ceFAZolin (ANCEF) IVPB 2g/100 mL premix 2 g 200 mL/hr   04/29/16 0515 Given   ceFAZolin (ANCEF) IVPB 2g/100 mL premix 2 g 200 mL/hr   04/29/16 1341 Given   ceFAZolin (ANCEF) IVPB 2g/100 mL premix 2 g 200 mL/hr     . calcium citrate-vitamin D  1 tablet Oral BID  .  ceFAZolin (ANCEF) IV  2 g Intravenous Q8H  . docusate sodium  100 mg Oral BID  . enoxaparin (LOVENOX) injection  40 mg Subcutaneous Q12H  . famotidine  20 mg Oral BID  . insulin aspart  0-15 Units Subcutaneous TID WC  . insulin aspart  0-5 Units Subcutaneous QHS  . insulin aspart  6 Units Subcutaneous TID WC  . insulin glargine  42 Units Subcutaneous Q24H  . nicotine  21 mg Transdermal Daily  . nystatin  5 mL Oral QID  . silver sulfADIAZINE   Topical Daily    Objective: Vital signs in last 24 hours: Temp:  [98.6 F (37 C)-98.9 F (37.2 C)] 98.6 F (37 C) (05/05 0443) Pulse Rate:  [95-117] 95 (05/05 0443) Resp:  [18-22] 22 (05/05 0443) BP: (113-120)/(55-62) 120/61 mmHg (05/05 0443) SpO2:  [96 %-100 %] 100 % (05/05 0443) Weight:  [137.939 kg (304 lb 1.6 oz)] 137.939 kg (304 lb 1.6 oz) (05/05 0443) Constitutional: oriented to person, place, and time. appears  well-developed and well-nourished. No distress. obese HENT: Mutual/AT, PERRLA, no scleral icterus Mouth/Throat: Oropharynx is clear and moist. No oropharyngeal exudate.  Cardiovascular: Normal rate, regular rhythm and normal heart sounds. Pulmonary/Chest: Effort normal and breath sounds normal. No respiratory distress. has no wheezes.  Neck supple, no nuchal rigidity Abdominal: Soft. Bowel sounds are normal. exhibits no distension. There is no tenderness.  Lymphadenopathy: no cervical adenopathy. No axillary adenopathy Neurological: alert and oriented to person, place, and time. LE weakness, difficult to assess as reports muscle pain  Skin: LLE with edema and redness, has 1 cm shallow ankle Psychiatric: a normal mood and affect. behavior is normal.   Lab Results  Recent Labs  04/27/16 0755 04/28/16 0506 04/29/16 0550  WBC  --  10.9  --   HGB  --  9.5*  --   HCT  --  28.4*  --   NA 129* 125* 130*  K 3.7 3.6  --   CL 96* 93*  --   CO2 23 24  --   BUN 28* 20  --   CREATININE 0.90 0.79  --     Microbiology: Results for orders placed or performed during the hospital encounter of 04/23/16  Blood  culture (routine x 2)     Status: Abnormal   Collection Time: 04/23/16  4:35 PM  Result Value Ref Range Status   Specimen Description BLOOD RIGHT ANTECUBITAL  Final   Special Requests   Final    BOTTLES DRAWN AEROBIC AND ANAEROBIC  AER 7CC ANA 1CC   Culture  Setup Time   Final    GRAM NEGATIVE RODS IN BOTH AEROBIC AND ANAEROBIC BOTTLES CRITICAL RESULT CALLED TO, READ BACK BY AND VERIFIED WITH: Morton Stall @ [phone] 04/24/16 by [facility]    Culture (A)  Final    KLEBSIELLA PNEUMONIAE IN BOTH AEROBIC AND ANAEROBIC BOTTLES    Report Status 04/26/2016 FINAL  Final   Organism ID, Bacteria KLEBSIELLA PNEUMONIAE  Final      Susceptibility   Klebsiella pneumoniae - MIC*    AMPICILLIN Value in next row Resistant      RESISTANT>=32    CEFAZOLIN Value in next row Sensitive      SENSITIVE<=4     CEFEPIME Value in next row Sensitive      SENSITIVE<=1    CEFTAZIDIME Value in next row Sensitive      SENSITIVE<=1    CEFTRIAXONE Value in next row Sensitive      SENSITIVE<=1    CIPROFLOXACIN Value in next row Sensitive      SENSITIVE<=0.25    GENTAMICIN Value in next row Sensitive      SENSITIVE<=1    IMIPENEM Value in next row Sensitive      SENSITIVE<=0.25    TRIMETH/SULFA Value in next row Sensitive      SENSITIVE<=20    AMPICILLIN/SULBACTAM Value in next row Sensitive      SENSITIVE8    PIP/TAZO Value in next row Sensitive      SENSITIVE<=4    Extended ESBL Value in next row Sensitive      SENSITIVE<=4    * KLEBSIELLA PNEUMONIAE  Blood Culture ID Panel (Reflexed)     Status: Abnormal   Collection Time: 04/23/16  4:35 PM  Result Value Ref Range Status   Enterococcus species NOT DETECTED NOT DETECTED Final   Vancomycin resistance NOT DETECTED NOT DETECTED Final   Listeria monocytogenes NOT DETECTED NOT DETECTED Final   Staphylococcus species NOT DETECTED NOT DETECTED Final   Staphylococcus aureus NOT DETECTED NOT DETECTED Final   Methicillin resistance NOT DETECTED NOT DETECTED Final   Streptococcus species NOT DETECTED NOT DETECTED Final   Streptococcus agalactiae NOT DETECTED NOT DETECTED Final   Streptococcus pneumoniae NOT DETECTED NOT DETECTED Final   Streptococcus pyogenes NOT DETECTED NOT DETECTED Final   Acinetobacter baumannii NOT DETECTED NOT DETECTED Final   Enterobacteriaceae species DETECTED (A) NOT DETECTED Final    Comment: CRITICAL RESULT CALLED TO, READ BACK BY AND VERIFIED WITH: Nate Cookson @ [phone] 04/24/16 by [facility]    Enterobacter cloacae complex NOT DETECTED NOT DETECTED Final   Escherichia coli NOT DETECTED NOT DETECTED Final   Klebsiella oxytoca NOT DETECTED NOT DETECTED Final   Klebsiella pneumoniae DETECTED (A) NOT DETECTED Final    Comment: CRITICAL RESULT CALLED TO, READ BACK BY AND VERIFIED WITH: Nate Cookson @ [phone] 04/24/16 by [facility]    Proteus  species NOT DETECTED NOT DETECTED Final   Serratia marcescens NOT DETECTED NOT DETECTED Final   Carbapenem resistance NOT DETECTED NOT DETECTED Final   Haemophilus influenzae NOT DETECTED NOT DETECTED Final   Neisseria meningitidis NOT DETECTED NOT DETECTED Final   Pseudomonas aeruginosa NOT DETECTED NOT DETECTED Final   Candida  albicans NOT DETECTED NOT DETECTED Final   Candida glabrata NOT DETECTED NOT DETECTED Final   Candida krusei NOT DETECTED NOT DETECTED Final   Candida parapsilosis NOT DETECTED NOT DETECTED Final   Candida tropicalis NOT DETECTED NOT DETECTED Final  Blood culture (routine x 2)     Status: Abnormal   Collection Time: 04/23/16  4:41 PM  Result Value Ref Range Status   Specimen Description BLOOD RIGHT ARM  Final   Special Requests   Final    BOTTLES DRAWN AEROBIC AND ANAEROBIC  AER 6CC ANA 2CC   Culture  Setup Time   Final    GRAM NEGATIVE RODS IN BOTH AEROBIC AND ANAEROBIC BOTTLES CRITICAL VALUE NOTED.  VALUE IS CONSISTENT WITH PREVIOUSLY REPORTED AND CALLED VALUE.    Culture (A)  Final    KLEBSIELLA PNEUMONIAE IN BOTH AEROBIC AND ANAEROBIC BOTTLES    Report Status 04/26/2016 FINAL  Final   Organism ID, Bacteria KLEBSIELLA PNEUMONIAE  Final      Susceptibility   Klebsiella pneumoniae - MIC*    AMPICILLIN >=32 RESISTANT Resistant     CEFAZOLIN <=4 SENSITIVE Sensitive     CEFEPIME <=1 SENSITIVE Sensitive     CEFTAZIDIME <=1 SENSITIVE Sensitive     CEFTRIAXONE <=1 SENSITIVE Sensitive     CIPROFLOXACIN <=0.25 SENSITIVE Sensitive     GENTAMICIN <=1 SENSITIVE Sensitive     IMIPENEM <=0.25 SENSITIVE Sensitive     TRIMETH/SULFA <=20 SENSITIVE Sensitive     AMPICILLIN/SULBACTAM 8 SENSITIVE Sensitive     PIP/TAZO <=4 SENSITIVE Sensitive     Extended ESBL NEGATIVE Sensitive     * KLEBSIELLA PNEUMONIAE  Urine culture     Status: Abnormal   Collection Time: 04/23/16  4:41 PM  Result Value Ref Range Status   Specimen Description URINE, CLEAN CATCH  Final    Special Requests NONE  Final   Culture >=100,000 COLONIES/mL KLEBSIELLA PNEUMONIAE (A)  Final   Report Status 04/25/2016 FINAL  Final   Organism ID, Bacteria KLEBSIELLA PNEUMONIAE (A)  Final      Susceptibility   Klebsiella pneumoniae - MIC*    AMPICILLIN >=32 RESISTANT Resistant     CEFAZOLIN <=4 SENSITIVE Sensitive     CEFTRIAXONE <=1 SENSITIVE Sensitive     CIPROFLOXACIN <=0.25 SENSITIVE Sensitive     GENTAMICIN <=1 SENSITIVE Sensitive     IMIPENEM <=0.25 SENSITIVE Sensitive     NITROFURANTOIN 32 SENSITIVE Sensitive     TRIMETH/SULFA <=20 SENSITIVE Sensitive     AMPICILLIN/SULBACTAM 8 SENSITIVE Sensitive     PIP/TAZO <=4 SENSITIVE Sensitive     Extended ESBL NEGATIVE Sensitive     * >=100,000 COLONIES/mL KLEBSIELLA PNEUMONIAE  MRSA PCR Screening     Status: Abnormal   Collection Time: 04/23/16  9:06 PM  Result Value Ref Range Status   MRSA by PCR POSITIVE (A) NEGATIVE Final    Comment:        The GeneXpert MRSA Assay (FDA approved for NASAL specimens only), is one component of a comprehensive MRSA colonization surveillance program. It is not intended to diagnose MRSA infection nor to guide or monitor treatment for MRSA infections. CRITICAL RESULT CALLED TO, READ BACK BY AND VERIFIED WITH: LESLIE LEWIS AT 2312 04/23/16.PMH     Studies/Results: Ct Abdomen Pelvis W Contrast  04/27/2016  CLINICAL DATA:  Sepsis, possible renal cysts on lumbar spine MRI EXAM: CT ABDOMEN AND PELVIS WITH CONTRAST TECHNIQUE: Multidetector CT imaging of the abdomen and pelvis was performed using the standard protocol following  bolus administration of intravenous contrast. CONTRAST:  ISOVUE-300 IOPAMIDOL (ISOVUE-300) INJECTION 61% COMPARISON:  Lumbar spine MRI dated 04/25/2016 FINDINGS: Lower chest:  Mild scarring/ atelectasis at the left lung base. Hepatobiliary: Liver is within normal limits. No suspicious/enhancing hepatic lesions. Gallbladder is unremarkable. No intrahepatic or extrahepatic  ductal dilatation. Pancreas: Within normal limits. Spleen: Within normal limits. Adrenals/Urinary Tract: Adrenal glands are within normal limits. Right kidney is within normal limits. Left kidney is notable for a 3.1 x 2.6 cm simple cyst along the medial left upper pole (series 2/ image 36), corresponding to the MRI abnormality. Additional 2.3 cm simple cyst along the anterior left lower kidney (series 2/image 55). However, there is multifocal heterogeneous perfusion of the upper pole and interpolar region in the left kidney, with possible multiple underlying lesions. For example, there is a possible 3.5 x 4.1 cm lesion anteriorly in the left upper pole (series 2/ image 39), a 2.8 x 2.8 cm lesion in the posterior interpolar region (series 2/ image 42), two additional smaller lesions in the posterior interpolar left kidney (series 2/ image 42), and a possible 2.1 x 2.5 cm lesion in the posterior left lower kidney (series 2/ image 46). Overall, this appearance suggests pyelonephritis or a neoplastic process such as lymphoma. No hydronephrosis. Bladder is notable for mild left bladder wall thickening (series 2/image 84). Stomach/Bowel: Stomach is within normal limits. No evidence of bowel obstruction. Normal appendix (series 2/image 67). Vascular/Lymphatic: Atherosclerotic calcifications of the abdominal aorta and branch vessels. No evidence of abdominal aortic aneurysm. Small left retroperitoneal lymph nodes, including a mildly enlarged 15 mm short axis left para-aortic node (series 2/ image 44). Reproductive: Uterus is within normal limits. Bilateral ovaries are unremarkable. Other: Small volume pelvic ascites. Musculoskeletal: Mild degenerative changes of the lower thoracic spine. IMPRESSION: MRI abnormality corresponds to a 3.1 cm simple cyst in the medial left upper kidney. Multifocal heterogeneous perfusion of the left kidney with possible multiple underlying lesions, measuring up to 4.1 cm, as above.  Differential considerations include pyelonephritis or a neoplastic process such as lymphoma. Associated 15 mm short axis left para-aortic node. Correlate for signs/symptoms of pyelonephritis. Follow-up CT or MRI abdomen with/without contrast is suggested in 4 weeks. Electronically Signed   By: Charline Bills M.D.   On: 04/27/2016 18:14    Assessment/Plan: Ovella L Brooking is a 55 y.o. female with recurrent fevers following admission with AMS and LE weakness. Found to have urosepsis with Klebsiella, as well as ARF, rhabdo. Has persistent LE weakness and pain but MRI L and T spine nml. Renal USS nml. Labs improving. Does have a L 3 cm renal cystic lesion noted on MRI L spine with surrounding LAN.  She has L pyelonephritis based on  CT abd pelvis but some possibility of malignancy so will need fu otpt imaging with urology Day  of abx 7  Recommendations Continue cefazolin IV while inpatient but at DC can change to oral keflex - needs 21 day total course with followup to ensure no recurrence  Needs wu of the L renal lesion as otpt  New LLE edema and cellulitis with wound- has been progressing on ancef Ordered doppler I have cultured wound Add doxy  Can possibly dc over weekend on keflex and doxy as long as fu  Thank you very much for the consult. Will follow with you.  FITZGERALD, DAVID P   04/29/2016, 2:05 PM

## 2016-04-29 NOTE — Progress Notes (Signed)
Per Jomarie Longs [facility] liaison patient's [employer] authorization is good through 05/12/16. Per Jomarie Longs patient can come to [facility] over the weekend if medically stable. If patient is ready for D/C over the weekend she will go to room 713 at [facility]. RN will call report to [address] Clinical Social Worker (CSW) will fax D/C Summary at [phone]. Per MD patient may be ready for D/C over the weekend. CSW will continue to follow and assist as needed.   Jetta Lout, LCSW [phone]

## 2016-04-29 NOTE — Progress Notes (Signed)
Pharmacy Antibiotic Note - Day 7/21  [name] is a 55 y.o. female admitted on 04/23/2016 with bacteremia.  Pharmacy has been consulted for cefazolin dosing.  Plan: Will continue patient on cefazolin 2g IV Q8hr.    Height: 5\' 5"  (165.1 cm) Weight: (!) 304 lb 1.6 oz (137.939 kg) IBW/kg (Calculated) : 57  Temp (24hrs), Avg:98.8 F (37.1 C), Min:98.6 F (37 C), Max:98.9 F (37.2 C)   Recent Labs Lab 04/23/16 1641 04/23/16 2020  04/24/16 0439  04/24/16 2322 04/25/16 0600 04/26/16 0538 04/27/16 0755 04/28/16 0506  WBC 9.6 6.5  --  5.3  --   --  5.7 6.0  --  10.9  CREATININE 3.10* 2.36*  < > 2.53*  < > 1.83* 1.64* 1.09* 0.90 0.79  LATICACIDVEN 2.2* 2.8*  --   --   --   --   --   --   --   --   < > = values in this interval not displayed.  Estimated Creatinine Clearance: 113.5 mL/min (by C-G formula based on Cr of 0.79).    Allergies  Allergen Reactions  . Biaxin [Clarithromycin] Rash    Patient states this medication gives her severe rash and thrush in the mouth.    Antimicrobials this admission: Vancomycin 4/29>>4/29 Zosyn 4/29>>4/29 Ceftriaxone 4/29>>5/03 Cefazolin 5/04>>  Microbiology results: 4/29 Blood Cultures x 2: Klebsiella Pneumoniae, Sensitive to Cefazolin 4/29 Urine Culture: Klebsiella Pneumoniae, Sensitive to Cefazolin 4/29 MRSA PCR: Positive  Pharmacy will continue to monitor and adjust per consult.    [name] L 04/29/2016 9:22 AM

## 2016-04-29 NOTE — Progress Notes (Signed)
Subjective: Patient unchanged.  Is having more discomfort from the dysesthesias.  Objective: Current vital signs: BP 120/61 mmHg  Pulse 95  Temp(Src) 98.6 F (37 C) (Oral)  Resp 22  Ht 5\' 5"  (1.651 m)  Wt 137.939 kg (304 lb 1.6 oz)  BMI 50.60 kg/m2  SpO2 100% Vital signs in last 24 hours: Temp:  [98.6 F (37 C)-98.9 F (37.2 C)] 98.6 F (37 C) (05/05 0443) Pulse Rate:  [95-117] 95 (05/05 0443) Resp:  [18-22] 22 (05/05 0443) BP: (113-120)/(55-62) 120/61 mmHg (05/05 0443) SpO2:  [96 %-100 %] 100 % (05/05 0443) Weight:  [137.939 kg (304 lb 1.6 oz)] 137.939 kg (304 lb 1.6 oz) (05/05 0443)  Intake/Output from previous day: 05/04 0701 - 05/05 0700 In: 3348.3 [I.V.:2048.3; IV Piggyback:1300] Out: 450 [Urine:450] Intake/Output this shift:   Nutritional status: Diet Carb Modified Fluid consistency:: Thin; Room service appropriate?: Yes  Neurologic Exam: Mental Status: Alert, oriented, thought content appropriate. Speech fluent without evidence of aphasia. Able to follow 3 step commands without difficulty. Cranial Nerves: II: Discs flat bilaterally; Visual fields grossly normal, pupils equal, round, reactive to light and accommodation III,IV, VI: ptosis not present, extra-ocular motions intact bilaterally V,VII: smile symmetric, facial light touch sensation normal bilaterally VIII: hearing normal bilaterally IX,X: gag reflex present XI: bilateral shoulder shrug XII: midline tongue extension Motor: Right :Upper extremity 5/5Left: Upper extremity 5/5 Patient can lift each leg off the bed easily. Continues to be able to flex at the knee at about 3/5. Flicker of ankle flexion noted on the left Sensory: Continued altered sensation, mostly in the toes.   Lab Results: Basic Metabolic Panel:  Recent Labs Lab 04/24/16 2322 04/25/16 0600 04/26/16 0538 04/27/16 0755 04/28/16 0506 04/29/16 0550  NA 131* 131* 132* 129* 125*  130*  K 3.0* 3.3* 3.9 3.7 3.6  --   CL 95* 93* 97* 96* 93*  --   CO2 23 25 27 23 24   --   GLUCOSE 131* 282* 178* 194* 94  --   BUN 71* 67* 43* 28* 20  --   CREATININE 1.83* 1.64* 1.09* 0.90 0.79  --   CALCIUM 7.6* 7.3* 7.5* 7.6* 7.6*  --   MG  --   --  1.8  --   --   --     Liver Function Tests:  Recent Labs Lab 04/23/16 1641  AST 90*  ALT 45  ALKPHOS 61  BILITOT 0.9  PROT 7.4  ALBUMIN 2.7*   No results for input(s): LIPASE, AMYLASE in the last 168 hours. No results for input(s): AMMONIA in the last 168 hours.  CBC:  Recent Labs Lab 04/23/16 1641 04/23/16 2020 04/24/16 0439 04/25/16 0600 04/26/16 0538 04/28/16 0506  WBC 9.6 6.5 5.3 5.7 6.0 10.9  NEUTROABS 8.8*  --   --   --   --   --   HGB 10.6* 8.4* 10.1* 9.6* 9.6* 9.5*  HCT 32.7* 25.3* 30.0* 28.5* 27.8* 28.4*  MCV 86.1 84.0 82.9 81.4 82.3 83.3  PLT 203 152 168 134* 151 201    Cardiac Enzymes:  Recent Labs Lab 04/23/16 1641 04/23/16 1642 04/23/16 2020 04/24/16 0730 04/25/16 0600 04/26/16 0538  CKTOTAL  --  5665* 3670* 2934* 1395* 563*  TROPONINI 0.03  --   --   --   --   --     Lipid Panel: No results for input(s): CHOL, TRIG, HDL, CHOLHDL, VLDL, LDLCALC in the last 168 hours.  CBG:  Recent Labs Lab 04/28/16 1107  04/28/16 1602 04/28/16 2102 04/29/16 0729 04/29/16 1115  GLUCAP 101* 105* 111* 88 130*    Microbiology: Results for orders placed or performed during the hospital encounter of 04/23/16  Blood culture (routine x 2)     Status: Abnormal   Collection Time: 04/23/16  4:35 PM  Result Value Ref Range Status   Specimen Description BLOOD RIGHT ANTECUBITAL  Final   Special Requests   Final    BOTTLES DRAWN AEROBIC AND ANAEROBIC  AER 7CC ANA 1CC   Culture  Setup Time   Final    GRAM NEGATIVE RODS IN BOTH AEROBIC AND ANAEROBIC BOTTLES CRITICAL RESULT CALLED TO, READ BACK BY AND VERIFIED WITH: Morton Stall @ [phone] 04/24/16 by [facility]    Culture (A)  Final    KLEBSIELLA PNEUMONIAE IN  BOTH AEROBIC AND ANAEROBIC BOTTLES    Report Status 04/26/2016 FINAL  Final   Organism ID, Bacteria KLEBSIELLA PNEUMONIAE  Final      Susceptibility   Klebsiella pneumoniae - MIC*    AMPICILLIN Value in next row Resistant      RESISTANT>=32    CEFAZOLIN Value in next row Sensitive      SENSITIVE<=4    CEFEPIME Value in next row Sensitive      SENSITIVE<=1    CEFTAZIDIME Value in next row Sensitive      SENSITIVE<=1    CEFTRIAXONE Value in next row Sensitive      SENSITIVE<=1    CIPROFLOXACIN Value in next row Sensitive      SENSITIVE<=0.25    GENTAMICIN Value in next row Sensitive      SENSITIVE<=1    IMIPENEM Value in next row Sensitive      SENSITIVE<=0.25    TRIMETH/SULFA Value in next row Sensitive      SENSITIVE<=20    AMPICILLIN/SULBACTAM Value in next row Sensitive      SENSITIVE8    PIP/TAZO Value in next row Sensitive      SENSITIVE<=4    Extended ESBL Value in next row Sensitive      SENSITIVE<=4    * KLEBSIELLA PNEUMONIAE  Blood Culture ID Panel (Reflexed)     Status: Abnormal   Collection Time: 04/23/16  4:35 PM  Result Value Ref Range Status   Enterococcus species NOT DETECTED NOT DETECTED Final   Vancomycin resistance NOT DETECTED NOT DETECTED Final   Listeria monocytogenes NOT DETECTED NOT DETECTED Final   Staphylococcus species NOT DETECTED NOT DETECTED Final   Staphylococcus aureus NOT DETECTED NOT DETECTED Final   Methicillin resistance NOT DETECTED NOT DETECTED Final   Streptococcus species NOT DETECTED NOT DETECTED Final   Streptococcus agalactiae NOT DETECTED NOT DETECTED Final   Streptococcus pneumoniae NOT DETECTED NOT DETECTED Final   Streptococcus pyogenes NOT DETECTED NOT DETECTED Final   Acinetobacter baumannii NOT DETECTED NOT DETECTED Final   Enterobacteriaceae species DETECTED (A) NOT DETECTED Final    Comment: CRITICAL RESULT CALLED TO, READ BACK BY AND VERIFIED WITH: Nate Cookson @ [phone] 04/24/16 by [facility]    Enterobacter cloacae complex NOT  DETECTED NOT DETECTED Final   Escherichia coli NOT DETECTED NOT DETECTED Final   Klebsiella oxytoca NOT DETECTED NOT DETECTED Final   Klebsiella pneumoniae DETECTED (A) NOT DETECTED Final    Comment: CRITICAL RESULT CALLED TO, READ BACK BY AND VERIFIED WITH: Nate Cookson @ [phone] 04/24/16 by [facility]    Proteus species NOT DETECTED NOT DETECTED Final   Serratia marcescens NOT DETECTED NOT DETECTED Final   Carbapenem resistance NOT DETECTED  NOT DETECTED Final   Haemophilus influenzae NOT DETECTED NOT DETECTED Final   Neisseria meningitidis NOT DETECTED NOT DETECTED Final   Pseudomonas aeruginosa NOT DETECTED NOT DETECTED Final   Candida albicans NOT DETECTED NOT DETECTED Final   Candida glabrata NOT DETECTED NOT DETECTED Final   Candida krusei NOT DETECTED NOT DETECTED Final   Candida parapsilosis NOT DETECTED NOT DETECTED Final   Candida tropicalis NOT DETECTED NOT DETECTED Final  Blood culture (routine x 2)     Status: Abnormal   Collection Time: 04/23/16  4:41 PM  Result Value Ref Range Status   Specimen Description BLOOD RIGHT ARM  Final   Special Requests   Final    BOTTLES DRAWN AEROBIC AND ANAEROBIC  AER 6CC ANA 2CC   Culture  Setup Time   Final    GRAM NEGATIVE RODS IN BOTH AEROBIC AND ANAEROBIC BOTTLES CRITICAL VALUE NOTED.  VALUE IS CONSISTENT WITH PREVIOUSLY REPORTED AND CALLED VALUE.    Culture (A)  Final    KLEBSIELLA PNEUMONIAE IN BOTH AEROBIC AND ANAEROBIC BOTTLES    Report Status 04/26/2016 FINAL  Final   Organism ID, Bacteria KLEBSIELLA PNEUMONIAE  Final      Susceptibility   Klebsiella pneumoniae - MIC*    AMPICILLIN >=32 RESISTANT Resistant     CEFAZOLIN <=4 SENSITIVE Sensitive     CEFEPIME <=1 SENSITIVE Sensitive     CEFTAZIDIME <=1 SENSITIVE Sensitive     CEFTRIAXONE <=1 SENSITIVE Sensitive     CIPROFLOXACIN <=0.25 SENSITIVE Sensitive     GENTAMICIN <=1 SENSITIVE Sensitive     IMIPENEM <=0.25 SENSITIVE Sensitive     TRIMETH/SULFA <=20 SENSITIVE Sensitive      AMPICILLIN/SULBACTAM 8 SENSITIVE Sensitive     PIP/TAZO <=4 SENSITIVE Sensitive     Extended ESBL NEGATIVE Sensitive     * KLEBSIELLA PNEUMONIAE  Urine culture     Status: Abnormal   Collection Time: 04/23/16  4:41 PM  Result Value Ref Range Status   Specimen Description URINE, CLEAN CATCH  Final   Special Requests NONE  Final   Culture >=100,000 COLONIES/mL KLEBSIELLA PNEUMONIAE (A)  Final   Report Status 04/25/2016 FINAL  Final   Organism ID, Bacteria KLEBSIELLA PNEUMONIAE (A)  Final      Susceptibility   Klebsiella pneumoniae - MIC*    AMPICILLIN >=32 RESISTANT Resistant     CEFAZOLIN <=4 SENSITIVE Sensitive     CEFTRIAXONE <=1 SENSITIVE Sensitive     CIPROFLOXACIN <=0.25 SENSITIVE Sensitive     GENTAMICIN <=1 SENSITIVE Sensitive     IMIPENEM <=0.25 SENSITIVE Sensitive     NITROFURANTOIN 32 SENSITIVE Sensitive     TRIMETH/SULFA <=20 SENSITIVE Sensitive     AMPICILLIN/SULBACTAM 8 SENSITIVE Sensitive     PIP/TAZO <=4 SENSITIVE Sensitive     Extended ESBL NEGATIVE Sensitive     * >=100,000 COLONIES/mL KLEBSIELLA PNEUMONIAE  MRSA PCR Screening     Status: Abnormal   Collection Time: 04/23/16  9:06 PM  Result Value Ref Range Status   MRSA by PCR POSITIVE (A) NEGATIVE Final    Comment:        The GeneXpert MRSA Assay (FDA approved for NASAL specimens only), is one component of a comprehensive MRSA colonization surveillance program. It is not intended to diagnose MRSA infection nor to guide or monitor treatment for MRSA infections. CRITICAL RESULT CALLED TO, READ BACK BY AND VERIFIED WITH: Raymona Boss LEWIS AT 2312 04/23/16.PMH     Coagulation Studies: No results for input(s): LABPROT, INR in the  last 72 hours.  Imaging: Ct Abdomen Pelvis W Contrast  04/27/2016  CLINICAL DATA:  Sepsis, possible renal cysts on lumbar spine MRI EXAM: CT ABDOMEN AND PELVIS WITH CONTRAST TECHNIQUE: Multidetector CT imaging of the abdomen and pelvis was performed using the standard protocol  following bolus administration of intravenous contrast. CONTRAST:  ISOVUE-300 IOPAMIDOL (ISOVUE-300) INJECTION 61% COMPARISON:  Lumbar spine MRI dated 04/25/2016 FINDINGS: Lower chest:  Mild scarring/ atelectasis at the left lung base. Hepatobiliary: Liver is within normal limits. No suspicious/enhancing hepatic lesions. Gallbladder is unremarkable. No intrahepatic or extrahepatic ductal dilatation. Pancreas: Within normal limits. Spleen: Within normal limits. Adrenals/Urinary Tract: Adrenal glands are within normal limits. Right kidney is within normal limits. Left kidney is notable for a 3.1 x 2.6 cm simple cyst along the medial left upper pole (series 2/ image 36), corresponding to the MRI abnormality. Additional 2.3 cm simple cyst along the anterior left lower kidney (series 2/image 55). However, there is multifocal heterogeneous perfusion of the upper pole and interpolar region in the left kidney, with possible multiple underlying lesions. For example, there is a possible 3.5 x 4.1 cm lesion anteriorly in the left upper pole (series 2/ image 39), a 2.8 x 2.8 cm lesion in the posterior interpolar region (series 2/ image 42), two additional smaller lesions in the posterior interpolar left kidney (series 2/ image 42), and a possible 2.1 x 2.5 cm lesion in the posterior left lower kidney (series 2/ image 46). Overall, this appearance suggests pyelonephritis or a neoplastic process such as lymphoma. No hydronephrosis. Bladder is notable for mild left bladder wall thickening (series 2/image 84). Stomach/Bowel: Stomach is within normal limits. No evidence of bowel obstruction. Normal appendix (series 2/image 67). Vascular/Lymphatic: Atherosclerotic calcifications of the abdominal aorta and branch vessels. No evidence of abdominal aortic aneurysm. Small left retroperitoneal lymph nodes, including a mildly enlarged 15 mm short axis left para-aortic node (series 2/ image 44). Reproductive: Uterus is within normal  limits. Bilateral ovaries are unremarkable. Other: Small volume pelvic ascites. Musculoskeletal: Mild degenerative changes of the lower thoracic spine. IMPRESSION: MRI abnormality corresponds to a 3.1 cm simple cyst in the medial left upper kidney. Multifocal heterogeneous perfusion of the left kidney with possible multiple underlying lesions, measuring up to 4.1 cm, as above. Differential considerations include pyelonephritis or a neoplastic process such as lymphoma. Associated 15 mm short axis left para-aortic node. Correlate for signs/symptoms of pyelonephritis. Follow-up CT or MRI abdomen with/without contrast is suggested in 4 weeks. Electronically Signed   By: Charline Bills M.D.   On: 04/27/2016 18:14    Medications:  I have reviewed the patient's current medications. Scheduled: . calcium citrate-vitamin D  1 tablet Oral BID  .  ceFAZolin (ANCEF) IV  2 g Intravenous Q8H  . docusate sodium  100 mg Oral BID  . enoxaparin (LOVENOX) injection  40 mg Subcutaneous Q12H  . famotidine  20 mg Oral BID  . insulin aspart  0-15 Units Subcutaneous TID WC  . insulin aspart  0-5 Units Subcutaneous QHS  . insulin aspart  6 Units Subcutaneous TID WC  . insulin glargine  42 Units Subcutaneous Q24H  . nicotine  21 mg Transdermal Daily  . nystatin  5 mL Oral QID  . silver sulfADIAZINE   Topical Daily    Assessment/Plan: Patient unchanged.  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    Recommendations: 1.  Continue therapy   LOS: 6 days   Verlon Au  Thad Ranger, MD Neurology [phone] 04/29/2016  12:05 PM

## 2016-04-29 NOTE — Progress Notes (Signed)
[facility] - [place] at [facility]   PATIENT NAME: [name]    MR#:  [id number]  DATE OF BIRTH:  May 07, 1961  SUBJECTIVE:  CHIEF COMPLAINT:   Chief Complaint  Patient presents with  . Fall  . Hyperglycemia  Patient is 55 year old Caucasian female with past medical history significant for history of diabetes mellitus type 2, essential hypertension, hyperlipidemia who was brought to the hospital with confusion, generalized weakness, fever, nausea and vomiting, decreased urination, decreased oral intake. On arrival to the hospital patient was noted to be in DKA with elevated anion gap, blood glucose levels, hyponatremia, lactic acidosis, rhabdomyolysis. Urinalysis revealed pyuria. She was admitted to the hospital with sepsis, DKA, she was initiated on Rocephin intravenously. She remains febrile with temperature reaching 101 intermittently despite antibiotic therapy, blood pressure Is improved on IV fluids. Urine culture is growing Klebsiella pneumonia, blood cultures also revealed Klebsiella pneumonia. Kidney function is improving with creatinine of 1.09 and estimated GFR of 56 today, up from a GFR of 20, few days ago. . Sodium level has improved. The patient's potassium level normalized. Patient complained of bilateral lower extremity weakness and numbness, especially left lower extremity, which seems to be improving. Patient underwent MRI of thoracic and lumbar spine which was unremarkable, neurologist saw patient in consultation and felt that it was rhabdomyolysis related.  Complains of pain in left lower extremity , Some swelling, Doppler ultrasound is ordered. The patient's blood pressure has improved with IV fluid administrationYesterday. She feels somewhat better and her oral intake is reported as fair. Patient is noted to have a left lower extremity cellulitis today despite the cefazolin intravenously administration. Patient was seen again by Dr. Sampson Goon who  recommended doxycycline addition to medical regimen. Patient was noted to have SVT at 197 early in the morning today. Left lateral ankle of wound is cultured, pending results   Review of Systems  Constitutional: Positive for fever, chills and malaise/fatigue. Negative for weight loss.  HENT: Negative for congestion.   Eyes: Negative for blurred vision and double vision.  Respiratory: Negative for cough, sputum production, shortness of breath and wheezing.   Cardiovascular: Negative for chest pain, palpitations, orthopnea, leg swelling and PND.  Gastrointestinal: Negative for nausea, vomiting, abdominal pain, diarrhea, constipation and blood in stool.  Genitourinary: Negative for dysuria, urgency, frequency and hematuria.  Musculoskeletal: Negative for falls.  Neurological: Positive for sensory change, focal weakness and weakness. Negative for dizziness, tremors and headaches.  Endo/Heme/Allergies: Does not bruise/bleed easily.  Psychiatric/Behavioral: Negative for depression. The patient does not have insomnia.     VITAL SIGNS: Blood pressure 120/61, pulse 95, temperature 98.6 F (37 C), temperature source Oral, resp. rate 22, height 5\' 5"  (1.651 m), weight 137.939 kg (304 lb 1.6 oz), SpO2 100 %.  PHYSICAL EXAMINATION:   GENERAL:  55 y.o.-year-old patient lying in the bed with no acute distress.  Pale, grayish discoloration of face, side affect from a week EYES: Pupils equal, round, reactive to light and accommodation. No scleral icterus. Extraocular muscles intact.  HEENT: Head atraumatic, normocephalic. Oropharynx and nasopharynx clear.  NECK:  Supple, no jugular venous distention. No thyroid enlargement, no tenderness.  LUNGS:  Some diminished breath sounds bilaterally, no wheezing, rales,rhonchi or crepitation, poor effort . No use of accessory muscles of respiration.  CARDIOVASCULAR: S1, S2 normal. No murmurs, rubs, or gallops.  Tachycardic ABDOMEN: Soft,  minimally uncomfortable in  suprapubic area, nondistended. Bowel sounds present. No organomegaly or mass.  Comes urine in Foley catheter  is light yellow color, cloudy EXTREMITIES: 2+ lower extremity and pedal edema, no cyanosis, or clubbing. Tenderness on palpation in left lower extremity, diffusely. Left lateral ankle ulceration with purulent drainage, dressed. Right posterior upper thigh deep wound, per wound care specialist, unable to see it. Erythema of left lower extremity anterior tibial area, increased warmth and tenderness on palpation NEUROLOGIC: Cranial nerves II through XII are intact. Muscle strength 3/5 in both lower extremities, better in the right lower extremity, withdraws from painful stimuli. Sensation to light touch is decreased in both legs, but better in the right lower extremity.  Gait not checked. Palpation of the left lower extremity muscles is painful, but no fluctuations were noted and no focal point of tenderness PSYCHIATRIC: The patient is alert and oriented x 3.  SKIN: No obvious rash, lesion, or ulcer.   ORDERS/RESULTS REVIEWED:   CBC  Recent Labs Lab 04/23/16 1641 04/23/16 2020 04/24/16 0439 04/25/16 0600 04/26/16 0538 04/28/16 0506  WBC 9.6 6.5 5.3 5.7 6.0 10.9  HGB 10.6* 8.4* 10.1* 9.6* 9.6* 9.5*  HCT 32.7* 25.3* 30.0* 28.5* 27.8* 28.4*  PLT 203 152 168 134* 151 201  MCV 86.1 84.0 82.9 81.4 82.3 83.3  MCH 27.9 27.8 27.8 27.5 28.3 27.9  MCHC 32.4 33.1 33.6 33.7 34.3 33.5  RDW 16.0* 15.9* 15.8* 15.7* 15.9* 15.8*  LYMPHSABS 0.5*  --   --   --   --   --   MONOABS 0.3  --   --   --   --   --   EOSABS 0.0  --   --   --   --   --   BASOSABS 0.0  --   --   --   --   --    ------------------------------------------------------------------------------------------------------------------  Chemistries   Recent Labs Lab 04/23/16 1641  04/24/16 2322 04/25/16 0600 04/26/16 0538 04/27/16 0755 04/28/16 0506 04/29/16 0550 04/29/16 1618  NA 117*  < > 131* 131* 132* 129* 125* 130*  --    K 5.2*  < > 3.0* 3.3* 3.9 3.7 3.6  --   --   CL 82*  < > 95* 93* 97* 96* 93*  --   --   CO2 18*  < > 23 25 27 23 24   --   --   GLUCOSE 788*  < > 131* 282* 178* 194* 94  --   --   BUN 99*  < > 71* 67* 43* 28* 20  --   --   CREATININE 3.10*  < > 1.83* 1.64* 1.09* 0.90 0.79  --   --   CALCIUM 8.5*  < > 7.6* 7.3* 7.5* 7.6* 7.6*  --   --   MG  --   --   --   --  1.8  --   --   --  1.4*  AST 90*  --   --   --   --   --   --   --   --   ALT 45  --   --   --   --   --   --   --   --   ALKPHOS 61  --   --   --   --   --   --   --   --   BILITOT 0.9  --   --   --   --   --   --   --   --   < > =  values in this interval not displayed. ------------------------------------------------------------------------------------------------------------------ estimated creatinine clearance is 113.5 mL/min (by C-G formula based on Cr of 0.79). ------------------------------------------------------------------------------------------------------------------ No results for input(s): TSH, T4TOTAL, T3FREE, THYROIDAB in the last 72 hours.  Invalid input(s): FREET3  Cardiac Enzymes  Recent Labs Lab 04/23/16 1641  TROPONINI 0.03   ------------------------------------------------------------------------------------------------------------------ Invalid input(s): POCBNP ---------------------------------------------------------------------------------------------------------------  RADIOLOGY: Ct Abdomen Pelvis W Contrast  04/27/2016  CLINICAL DATA:  Sepsis, possible renal cysts on lumbar spine MRI EXAM: CT ABDOMEN AND PELVIS WITH CONTRAST TECHNIQUE: Multidetector CT imaging of the abdomen and pelvis was performed using the standard protocol following bolus administration of intravenous contrast. CONTRAST:  ISOVUE-300 IOPAMIDOL (ISOVUE-300) INJECTION 61% COMPARISON:  Lumbar spine MRI dated 04/25/2016 FINDINGS: Lower chest:  Mild scarring/ atelectasis at the left lung base. Hepatobiliary: Liver is within normal  limits. No suspicious/enhancing hepatic lesions. Gallbladder is unremarkable. No intrahepatic or extrahepatic ductal dilatation. Pancreas: Within normal limits. Spleen: Within normal limits. Adrenals/Urinary Tract: Adrenal glands are within normal limits. Right kidney is within normal limits. Left kidney is notable for a 3.1 x 2.6 cm simple cyst along the medial left upper pole (series 2/ image 36), corresponding to the MRI abnormality. Additional 2.3 cm simple cyst along the anterior left lower kidney (series 2/image 55). However, there is multifocal heterogeneous perfusion of the upper pole and interpolar region in the left kidney, with possible multiple underlying lesions. For example, there is a possible 3.5 x 4.1 cm lesion anteriorly in the left upper pole (series 2/ image 39), a 2.8 x 2.8 cm lesion in the posterior interpolar region (series 2/ image 42), two additional smaller lesions in the posterior interpolar left kidney (series 2/ image 42), and a possible 2.1 x 2.5 cm lesion in the posterior left lower kidney (series 2/ image 46). Overall, this appearance suggests pyelonephritis or a neoplastic process such as lymphoma. No hydronephrosis. Bladder is notable for mild left bladder wall thickening (series 2/image 84). Stomach/Bowel: Stomach is within normal limits. No evidence of bowel obstruction. Normal appendix (series 2/image 67). Vascular/Lymphatic: Atherosclerotic calcifications of the abdominal aorta and branch vessels. No evidence of abdominal aortic aneurysm. Small left retroperitoneal lymph nodes, including a mildly enlarged 15 mm short axis left para-aortic node (series 2/ image 44). Reproductive: Uterus is within normal limits. Bilateral ovaries are unremarkable. Other: Small volume pelvic ascites. Musculoskeletal: Mild degenerative changes of the lower thoracic spine. IMPRESSION: MRI abnormality corresponds to a 3.1 cm simple cyst in the medial left upper kidney. Multifocal heterogeneous  perfusion of the left kidney with possible multiple underlying lesions, measuring up to 4.1 cm, as above. Differential considerations include pyelonephritis or a neoplastic process such as lymphoma. Associated 15 mm short axis left para-aortic node. Correlate for signs/symptoms of pyelonephritis. Follow-up CT or MRI abdomen with/without contrast is suggested in 4 weeks. Electronically Signed   By: Charline Bills M.D.   On: 04/27/2016 18:14   US Venous Img Lower Unilateral Left  04/29/2016  CLINICAL DATA:  Acute left lower extremity edema and pain for 2 weeks. EXAM: LEFT LOWER EXTREMITY VENOUS DOPPLER ULTRASOUND TECHNIQUE: Gray-scale sonography with graded compression, as well as color Doppler and duplex ultrasound were performed to evaluate the lower extremity deep venous systems from the level of the common femoral vein and including the common femoral, femoral, profunda femoral, popliteal and calf veins including the posterior tibial, peroneal and gastrocnemius veins when visible. The superficial great saphenous vein was also interrogated. Spectral Doppler was utilized to evaluate flow at rest and with distal  augmentation maneuvers in the common femoral, femoral and popliteal veins. COMPARISON:  None. FINDINGS: Contralateral Common Femoral Vein: Respiratory phasicity is normal and symmetric with the symptomatic side. No evidence of thrombus. Normal compressibility. Common Femoral Vein: No evidence of thrombus. Normal compressibility, respiratory phasicity and response to augmentation. Saphenofemoral Junction: No evidence of thrombus. Normal compressibility and flow on color Doppler imaging. Profunda Femoral Vein: No evidence of thrombus. Normal compressibility and flow on color Doppler imaging. Femoral Vein: No evidence of thrombus. Normal compressibility, respiratory phasicity and response to augmentation. Popliteal Vein: No evidence of thrombus. Normal compressibility, respiratory phasicity and response to  augmentation. Calf Veins: No evidence of thrombus. Normal compressibility and flow on color Doppler imaging. Superficial Great Saphenous Vein: No evidence of thrombus. Normal compressibility and flow on color Doppler imaging. Venous Reflux:  None. Other Findings:  None. IMPRESSION: No evidence of deep venous thrombosis. Electronically Signed   By: Judie Petit.  Shick M.D.   On: 04/29/2016 16:07    EKG:  Orders placed or performed during the hospital encounter of 04/23/16  . ED EKG  . ED EKG    ASSESSMENT AND PLAN:  Active Problems:   DKA (diabetic ketoacidosis) (HCC)   Pressure ulcer #1. Sepsis due to Klebsiella pneumoniae, due to acute pyelonephritis 2/2 Klebsiella pneumoniae. Continue Cefazolin Intravenously, changed from Rocephin per Dr. Jarrett Ables recommendations per sensitivity results. Change to oral Keflex prior to discharge skilled nursing facility for rehabilitation , hopefully tomorrow. Blood pressure has improved , after IV fluid maintenance and bolus, discontinue IV fluid . Follow blood pressure readings closely. Urinary output was about 450 cc today.   #2. Septic shock, discontinue IV fluids, patient required IV fluid bolus yesterday due to profound hypotension. Blood pressure has improved, CT of abdomen and pelvis with IV contrast revealed simple cyst in left kidney, multifocal heterogeneous perfusion in The left kidney concerning for multiple underlying lesions, due to pyelonephritis versus  neoplastic process, follow-up CT was recommended as outpatient, to be ordered by urologist or primary care physician.  #3. Acute renal failure likely due to ATN, improved on IV fluids. Nephrology consultation is appreciated, renal ultrasound was unremarkable, however MRI revealed a left renal cyst, CT of abdomen and pelvis with contrast was performed, revealing simple cyst in left kidney, multifocal heterogeneous perfusion in The left kidney concerning for multiple underlying lesions, due to pyelonephritis  versus  neoplastic process, follow-up CT was recommended as outpatient, to be ordered by urologist or primary care physician.  #4 DKA due to poor diabetes control at home with hemoglobin A1c 11.1, blood glucose around 100-120, now on insulin Lantus at 42 units, following blood glucose levels closely, continue current therapy.   #5. Hyponatremia,  Recheck sodium level in the morning, urinary osmolality was not performed #6. Hyperkalemia, resolved with IV fluids, insulin therapy. #7. Rhabdomyolysis, patient received acetate solution drip, improved CK levels , kidney function has improved, urinary output is satisfactory #8. Lactic acidosis, resolved, due to hypoperfusion, septic shock, continue IV fluids . #9 Acute pyelonephritis due to Klebsiella pneumonia, unremarkable renal ultrasound, however MRI of spine revealed renal cysts, CT scan of abdomen and pelvis revealed heterogeneous left kidney perfusion, CT scan will need to be repeated as outpatient to rule out neoplasm . The patient is to continue cefazolin intravenously, to be changed to Keflex upon discharge to rehabilitation #10. Generalized weakness, continue physical therapy, some improvement, will need to have skilled nursing facility placement due to lower extremity weakness #11. Paraplegia, unremarkable thoracic and lumbar MRI, neurologist,  felt it was rhabdomyolysis related, physical therapist recommends skilled nursing facility placement for rehabilitation, likely tomorrow if remains afebrile and stable. Blood pressure readings #12. Left lower extremity cellulitis, now on doxycycline and cefazolin intravenously, patient will need to be changed to doxycycline and Keflex upon discharge to skilled nursing facility, she will will need to have 3 weeks of Keflex therapy per Dr. Jarrett Ables recommendations. Following wound cultures from today, adjust to doxycycline if needed. Discussed with Dr. Sampson Goon #13, SVT, initiate patient on metoprolol,  following blood pressure readings closely, check magnesium level, check TSH Management plans discussed with the patient, family and they are in agreement.   DRUG ALLERGIES:  Allergies  Allergen Reactions  . Biaxin [Clarithromycin] Rash    Patient states this medication gives her severe rash and thrush in the mouth.    CODE STATUS:     Code Status Orders        Start     Ordered   04/23/16 1833  Full code   Continuous     04/23/16 1834    Code Status History    Date Active Date Inactive Code Status Order ID Comments User Context   This patient has a current code status but no historical code status.      TOTAL  TIME TAKING CARE OF THIS PATIENT:  40 minutes.  Discussed with Dr. Ivan Anchors M.D on 04/29/2016 at 4:58 PM  Between 7am to 6pm - Pager - [phone]  After 6pm go to [email or web address] - password [email or web address] [facility]  [facility]  Office  [phone]  CC: Primary care physician; Dortha Kern, MD

## 2016-04-29 NOTE — Progress Notes (Signed)
Physical Therapy Treatment Patient Details Name: [name] MRN: [id number] DOB: 09-12-1961 Today's Date: 04/29/2016    History of Present Illness [name] is a 55 y.o. female with a known history of diabetes mellitus type 2, hypertension, hyperlipidemia brought in by family for confusion, generalized weakness. Patient was admitted with hyperglycemia with blood sugar levels >600. She also has sepsis and rhabdomyolysis.     PT Comments    Pt in bed fatigued from nursing interventions but agrees to supine exercises as described below.  PT very talkative and difficult to keep on task at times.  LE's tender to touch.    Follow Up Recommendations  SNF     Equipment Recommendations       Recommendations for Other Services       Precautions / Restrictions Precautions Precautions: Fall Restrictions Weight Bearing Restrictions: No    Mobility  Bed Mobility Overal bed mobility: Needs Assistance             General bed mobility comments: declined due to fatigue from dressing changes  Transfers                 General transfer comment: Unable at this time; poor feeling in feet L > R  Ambulation/Gait             General Gait Details: unable; no active DF currently   Stairs            Wheelchair Mobility    Modified Rankin (Stroke Patients Only)       Balance                                    Cognition Arousal/Alertness: Awake/alert Behavior During Therapy: WFL for tasks assessed/performed Overall Cognitive Status: Within Functional Limits for tasks assessed                      Exercises General Exercises - Lower Extremity Ankle Circles/Pumps: PROM;Both;20 reps;Supine Quad Sets: Strengthening;Both;20 reps;Supine Gluteal Sets: Strengthening;Both;20 reps;Supine Short Arc Quad: AROM;Both;20 reps;Supine Heel Slides: AROM;Both;20 reps;Supine Hip ABduction/ADduction: AROM;Both;20 reps;Supine Straight Leg Raises:  AROM;Both;10 reps;Supine    General Comments        Pertinent Vitals/Pain      Home Living                      Prior Function            PT Goals (current goals can now be found in the care plan section) Progress towards PT goals: Progressing toward goals    Frequency  Min 2X/week    PT Plan Current plan remains appropriate    Co-evaluation             End of Session   Activity Tolerance: Patient limited by fatigue;Patient limited by lethargy Patient left: in bed;with call bell/phone within reach;with bed alarm set     Time: 1005-1035 PT Time Calculation (min) (ACUTE ONLY): 30 min  Charges:  $Therapeutic Exercise: 23-37 mins                    G Codes:      Danielle Dess, PTA 04/29/2016, 12:00 PM

## 2016-04-29 NOTE — Consult Note (Addendum)
WOC wound follow up Wound type:Follow up to deep tissue injury, present on admission from prolonged time on toilet where patient was unable to get up.  DTI is progressing, and dark, maroon discoloration is still present today, some peeling epithelium noted.  Have been offloading and keeping  Wounds protected with silicone foam dressings.  Will add mattress with low air loss feature today.  Measurement:Right posterior upper thigh 2 cm x 2 cm blistering, peeling epithelium with 20 cm x 10 cm maroon discoloration present. This includes right buttocks and thigh area.  Left posterior upper thigh blistering 2 cm x 1 cm x 0.1 cm blistering, peeling epithelium with 15cm x 5 cm maroon discoloration .  This area is much more boggy today, no foul odor or drainage noted.  Wound EHM:CNOB and moist, where epithelium is peeling Drainage (amount, consistency, odor) Scant serous from peeling epithelium Periwound:Intact Dressing procedure/placement/frequency: Due to increased nonintact peeling epithelium, will begin Silvadene cream to wound bed.  Cover with ABD pads and tape.  Change daily. Will also add a mattress with low air loss feature to offload pressure and improve microclimate.  WOC team will follow.  Maple Hudson RN BSN CWON Pager [phone]

## 2016-04-29 NOTE — Care Management (Signed)
Spoke with Dr. Winona Legato. Developed cellulities of left leg. Also, will order wound care for wounds on the back side. Sodium level 130 this morning.  Will discharge to [facility] when stable Gwenette Greet RN MSN CCM Care Management [phone]

## 2016-04-30 LAB — CBC
HCT: 31 % — ABNORMAL LOW (ref 35.0–47.0)
HEMOGLOBIN: 10.2 g/dL — AB (ref 12.0–16.0)
MCH: 27.4 pg (ref 26.0–34.0)
MCHC: 32.8 g/dL (ref 32.0–36.0)
MCV: 83.5 fL (ref 80.0–100.0)
Platelets: 315 10*3/uL (ref 150–440)
RBC: 3.72 MIL/uL — AB (ref 3.80–5.20)
RDW: 16.1 % — ABNORMAL HIGH (ref 11.5–14.5)
WBC: 15.8 10*3/uL — AB (ref 3.6–11.0)

## 2016-04-30 LAB — BASIC METABOLIC PANEL
ANION GAP: 8 (ref 5–15)
BUN: 11 mg/dL (ref 6–20)
CO2: 24 mmol/L (ref 22–32)
Calcium: 7.9 mg/dL — ABNORMAL LOW (ref 8.9–10.3)
Chloride: 97 mmol/L — ABNORMAL LOW (ref 101–111)
Creatinine, Ser: 0.78 mg/dL (ref 0.44–1.00)
GFR calc Af Amer: >60 mL/min (ref >60)
Glucose, Bld: 95 mg/dL (ref 65–99)
POTASSIUM: 4 mmol/L (ref 3.5–5.1)
SODIUM: 129 mmol/L — AB (ref 135–145)

## 2016-04-30 LAB — GLUCOSE, CAPILLARY
GLUCOSE-CAPILLARY: 122 mg/dL — AB (ref 65–99)
GLUCOSE-CAPILLARY: 98 mg/dL (ref 65–99)
GLUCOSE-CAPILLARY: 127 mg/dL — AB (ref 65–99)
Glucose-Capillary: 80 mg/dL (ref 65–99)
Glucose-Capillary: 155 mg/dL — ABNORMAL HIGH (ref 65–99)

## 2016-04-30 MED ORDER — FUROSEMIDE 10 MG/ML IJ SOLN
20.0000 mg | Freq: Once | INTRAMUSCULAR | Status: AC
Start: 2016-04-30 — End: 2016-04-30
  Administered 2016-04-30: 20 mg via INTRAVENOUS
  Filled 2016-04-30: qty 2

## 2016-04-30 MED ORDER — FLUCONAZOLE IN SODIUM CHLORIDE 100-0.9 MG/50ML-% IV SOLN: 100.0000 mg | INTRAVENOUS | Status: DC

## 2016-04-30 MED ORDER — FLUCONAZOLE 100 MG PO TABS
100.0000 mg | ORAL_TABLET | Freq: Every day | ORAL | Status: DC
  Administered 2016-04-30 – 2016-05-02 (×3): 100 mg via ORAL
  Filled 2016-04-30 (×3): qty 1

## 2016-04-30 MED ORDER — MORPHINE SULFATE (PF) 2 MG/ML IV SOLN
1.0000 mg | Freq: Once | INTRAVENOUS | Status: AC
  Administered 2016-04-30: 15:00:00 1 mg via INTRAVENOUS
  Filled 2016-04-30: qty 1
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
## 2016-05-01 ENCOUNTER — Inpatient Hospital Stay: Payer: BLUE CROSS/BLUE SHIELD

## 2016-05-01 LAB — CBC WITH DIFFERENTIAL/PLATELET
BASOS ABS: 0.1 10*3/uL (ref 0–0.1)
Basophils Relative: 1 %
EOS ABS: 0.1 10*3/uL (ref 0–0.7)
EOS PCT: 1 %
HCT: 24.5 % — ABNORMAL LOW (ref 35.0–47.0)
HEMOGLOBIN: 8.2 g/dL — AB (ref 12.0–16.0)
Lymphocytes Relative: 5 %
Lymphs Abs: 0.7 10*3/uL — ABNORMAL LOW (ref 1.0–3.6)
MCH: 27.3 pg (ref 26.0–34.0)
MCHC: 33.6 g/dL (ref 32.0–36.0)
MCV: 81.3 fL (ref 80.0–100.0)
MONO ABS: 0.7 10*3/uL (ref 0.2–0.9)
Monocytes Relative: 5 %
NEUTROS PCT: 88 %
Neutro Abs: 11.4 10*3/uL — ABNORMAL HIGH (ref 1.4–6.5)
Platelets: 279 10*3/uL (ref 150–440)
RBC: 3.02 MIL/uL — AB (ref 3.80–5.20)
RDW: 15.7 % — ABNORMAL HIGH (ref 11.5–14.5)
WBC: 13 10*3/uL — AB (ref 3.6–11.0)

## 2016-05-01 LAB — GLUCOSE, CAPILLARY
GLUCOSE-CAPILLARY: 120 mg/dL — AB (ref 65–99)
GLUCOSE-CAPILLARY: 94 mg/dL (ref 65–99)
GLUCOSE-CAPILLARY: 186 mg/dL — AB (ref 65–99)
Glucose-Capillary: 74 mg/dL (ref 65–99)

## 2016-05-01 LAB — BASIC METABOLIC PANEL
ANION GAP: 8 (ref 5–15)
BUN: 14 mg/dL (ref 6–20)
CALCIUM: 7.9 mg/dL — AB (ref 8.9–10.3)
CO2: 24 mmol/L (ref 22–32)
Chloride: 95 mmol/L — ABNORMAL LOW (ref 101–111)
Creatinine, Ser: 0.77 mg/dL (ref 0.44–1.00)
GFR calc non Af Amer: >60 mL/min (ref >60)
Glucose, Bld: 78 mg/dL (ref 65–99)
Potassium: 3.9 mmol/L (ref 3.5–5.1)
Sodium: 127 mmol/L — ABNORMAL LOW (ref 135–145)
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
## 2016-05-02 ENCOUNTER — Inpatient Hospital Stay: Payer: BLUE CROSS/BLUE SHIELD

## 2016-05-02 LAB — CBC WITH DIFFERENTIAL/PLATELET
BASOS ABS: 0 10*3/uL (ref 0–0.1)
EOS ABS: 0.1 10*3/uL (ref 0–0.7)
Eosinophils Relative: 1 %
HCT: 26.1 % — ABNORMAL LOW (ref 35.0–47.0)
HEMOGLOBIN: 8.7 g/dL — AB (ref 12.0–16.0)
LYMPHS ABS: 0.8 10*3/uL — AB (ref 1.0–3.6)
Lymphocytes Relative: 8 %
MCH: 27.4 pg (ref 26.0–34.0)
MCHC: 33.3 g/dL (ref 32.0–36.0)
MCV: 82.5 fL (ref 80.0–100.0)
Monocytes Absolute: 0.7 10*3/uL (ref 0.2–0.9)
Monocytes Relative: 7 %
Neutro Abs: 9 10*3/uL — ABNORMAL HIGH (ref 1.4–6.5)
PLATELETS: 317 10*3/uL (ref 150–440)
RBC: 3.17 MIL/uL — AB (ref 3.80–5.20)
RDW: 16.3 % — AB (ref 11.5–14.5)
WBC: 10.6 10*3/uL (ref 3.6–11.0)

## 2016-05-02 LAB — BASIC METABOLIC PANEL
ANION GAP: 5 (ref 5–15)
BUN: 13 mg/dL (ref 6–20)
CALCIUM: 8.7 mg/dL — AB (ref 8.9–10.3)
CO2: 27 mmol/L (ref 22–32)
Chloride: 97 mmol/L — ABNORMAL LOW (ref 101–111)
Creatinine, Ser: 0.75 mg/dL (ref 0.44–1.00)
GLUCOSE: 120 mg/dL — AB (ref 65–99)
Potassium: 4.2 mmol/L (ref 3.5–5.1)
Sodium: 129 mmol/L — ABNORMAL LOW (ref 135–145)

## 2016-05-02 LAB — GLUCOSE, CAPILLARY
Glucose-Capillary: 119 mg/dL — ABNORMAL HIGH (ref 65–99)
Glucose-Capillary: 113 mg/dL — ABNORMAL HIGH (ref 65–99)

## 2016-05-02 MED ORDER — LATANOPROST 0.005 % OP SOLN: 1.0000 [drp] | Freq: Every day | OPHTHALMIC | Status: AC

## 2016-05-02 MED ORDER — TRAZODONE HCL 50 MG PO TABS: 25.0000 mg | ORAL_TABLET | Freq: Every day | ORAL | Status: DC

## 2016-05-02 MED ORDER — FLUCONAZOLE 100 MG PO TABS: 100.0000 mg | ORAL_TABLET | Freq: Every day | ORAL | Status: DC

## 2016-05-02 MED ORDER — BISACODYL 5 MG PO TBEC
5.0000 mg | DELAYED_RELEASE_TABLET | Freq: Every day | ORAL | Status: DC | PRN
Start: 2016-05-02 — End: 2017-04-25

## 2016-05-02 MED ORDER — INSULIN GLARGINE 100 UNIT/ML ~~LOC~~ SOLN
42.0000 [IU] | SUBCUTANEOUS | Status: DC
Start: 2016-05-02 — End: 2017-04-25

## 2016-05-02 MED ORDER — SILVER SULFADIAZINE 1 % EX CREA: TOPICAL_CREAM | Freq: Every day | CUTANEOUS | Status: DC

## 2016-05-02 MED ORDER — INSULIN ASPART 100 UNIT/ML ~~LOC~~ SOLN: 6.0000 [IU] | Freq: Three times a day (TID) | SUBCUTANEOUS | Status: DC

## 2016-05-02 MED ORDER — TRAZODONE HCL 50 MG PO TABS: 50.0000 mg | ORAL_TABLET | Freq: Every day | ORAL | Status: DC

## 2016-05-02 MED ORDER — CEPHALEXIN 500 MG PO CAPS: 500.0000 mg | ORAL_CAPSULE | Freq: Three times a day (TID) | ORAL | Status: AC

## 2016-05-02 MED ORDER — DOCUSATE SODIUM 100 MG PO CAPS
100.0000 mg | ORAL_CAPSULE | Freq: Two times a day (BID) | ORAL | Status: DC
Start: 2016-05-02 — End: 2017-04-25

## 2016-05-02 MED ORDER — FAMOTIDINE 20 MG PO TABS: 20.0000 mg | ORAL_TABLET | Freq: Two times a day (BID) | ORAL | Status: DC

## 2016-05-02 MED ORDER — NICOTINE 21 MG/24HR TD PT24: 21.0000 mg | MEDICATED_PATCH | Freq: Every day | TRANSDERMAL | Status: DC

## 2016-05-02 MED ORDER — ACETAMINOPHEN 325 MG PO TABS: 650.0000 mg | ORAL_TABLET | Freq: Four times a day (QID) | ORAL | Status: DC | PRN

## 2016-05-02 MED ORDER — CALCIUM CITRATE-VITAMIN D 500-400 MG-UNIT PO CHEW: 1.0000 | CHEWABLE_TABLET | Freq: Two times a day (BID) | ORAL | Status: DC

## 2016-05-02 MED ORDER — HYDROCODONE-ACETAMINOPHEN 5-325 MG PO TABS: 1.0000 | ORAL_TABLET | ORAL | Status: DC | PRN

## 2016-05-02 MED ORDER — DOXYCYCLINE HYCLATE 100 MG PO TABS: 100.0000 mg | ORAL_TABLET | Freq: Two times a day (BID) | ORAL | Status: DC

## 2016-05-02 MED ORDER — FUROSEMIDE 20 MG PO TABS: 20.0000 mg | ORAL_TABLET | Freq: Every day | ORAL | Status: AC

## 2016-05-02 MED ORDER — METOPROLOL TARTRATE 25 MG PO TABS: 25.0000 mg | ORAL_TABLET | Freq: Two times a day (BID) | ORAL | Status: AC

## 2016-05-02 MED ORDER — ALPRAZOLAM 0.5 MG PO TABS: 0.5000 mg | ORAL_TABLET | Freq: Two times a day (BID) | ORAL | Status: DC | PRN

## 2016-05-02 NOTE — Discharge Instructions (Signed)
Activity as tolerated per PT recommendations Diet cardiac and diabetic Repeat CAT scan of the abdomen and pelvis in 3 months to follow-up on the renal lesions Outpatient follow-up with primary care physician at the facility in 2-3 days Outpatient follow-up with neurology in a month for further evaluation of the paraplegia and EMG Outpatient follow-up with Dr. Sampson Goon in 2 weeks

## 2016-05-02 NOTE — Discharge Summary (Signed)
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 Primary care physician; [phone], MD

## 2016-05-02 NOTE — Progress Notes (Signed)
Patient is discharge to snf in a stable condition, report given to floor nurse, pick up by EMS

## 2016-05-02 NOTE — Progress Notes (Signed)
PT Cancellation Note  Patient Details Name: [name] MRN: [id number] DOB: Aug 23, 1961   Cancelled Treatment:    Reason Eval/Treat Not Completed: Pain limiting ability to participate    Pt in bed resting stating she is getting ready to go to rehab.  Offered session prior to leaving but she declined stating she was in significant pain this morning and did not feel that she could participate.  Pt stated she received pain medication and was comfortable at 2/10 but did not feel that it was "a good day".   Danielle Dess 05/02/2016, 11:29 AM

## 2016-05-02 NOTE — Clinical Social Work Placement (Signed)
   CLINICAL SOCIAL WORK PLACEMENT  NOTE  Date:  05/02/2016  Patient Details  Name: [name] MRN: [id number] Date of Birth: [birth year]  Clinical Social Work is seeking post-discharge placement for this patient at the Skilled  Nursing Facility level of care (*CSW will initial, date and re-position this form in  chart as items are completed):  Yes   Patient/family provided with [name] Clinical Social Work Department's list of facilities offering this level of care within the geographic area requested by the patient (or if unable, by the patient's family).  Yes   Patient/family informed of their freedom to choose among providers that offer the needed level of care, that participate in Medicare, Medicaid or managed care program needed by the patient, have an available bed and are willing to accept the patient.  Yes   Patient/family informed of [name]'s ownership interest in [name] and [name], as well as of the fact that they are under no obligation to receive care at these facilities.  PASRR submitted to EDS on 04/25/16     PASRR number received on 04/25/16     Existing PASRR number confirmed on       FL2 transmitted to all facilities in geographic area requested by pt/family on 04/25/16     FL2 transmitted to all facilities within larger geographic area on       Patient informed that his/her managed care company has contracts with or will negotiate with certain facilities, including the following:        Yes   Patient/family informed of bed offers received.  Patient chooses bed at  ([facility] )     Physician recommends and patient chooses bed at      Patient to be transferred to  ([facility] ) on 05/02/16.  Patient to be transferred to facility by  [name] )     Patient family notified on 05/02/16 of transfer.  Name of family member notified:   (Per patient her brother is aware of D/C today. )     PHYSICIAN       Additional Comment:     _______________________________________________ Haig Prophet, LCSW 05/02/2016, 2:11 PM

## 2016-05-02 NOTE — Progress Notes (Signed)
Patient is medically stable for D/C to [facility] today. Per Jomarie Long[facility] liaison patient can come to room 713 BCBS authorization is still active and current. RN will call report and arrange EMS for transport. Clinical Child psychotherapist (CSW) sent D/C Summary, FL2 and D/C Packet to [employer] via [employer]. Patient is aware of above. Per patient her mother is at [facility] and cannot be reached by telephone. Patient also reported that her brother is aware of D/C today. Please reconsult if future social work needs arise. CSW signing off.   Jetta Lout, LCSW [phone]

## 2016-05-02 NOTE — Progress Notes (Signed)
Patient refused MRI to be done , DR Thad Ranger and DR Gouru  was made aware

## 2016-05-02 NOTE — Progress Notes (Addendum)
Subjective: Patient unchanged.  Reports tingling pain in her feet being the most concerning.    Objective: Current vital signs: BP 120/68 mmHg  Pulse 108  Temp(Src) 98.5 F (36.9 C) (Oral)  Resp 20  Ht 5\' 5"  (1.651 m)  Wt 134.265 kg (296 lb)  BMI 49.26 kg/m2  SpO2 100% Vital signs in last 24 hours: Temp:  [98 F (36.7 C)-98.5 F (36.9 C)] 98.5 F (36.9 C) (05/08 0457) Pulse Rate:  [78-108] 108 (05/08 0457) Resp:  [20-24] 20 (05/07 2115) BP: (104-120)/(55-68) 120/68 mmHg (05/08 0457) SpO2:  [95 %-100 %] 100 % (05/08 0457)  Intake/Output from previous day: 05/07 0701 - 05/08 0700 In: 580 [P.O.:480; IV Piggyback:100] Out: 1175 [Urine:1175] Intake/Output this shift: Total I/O In: -  Out: 250 [Urine:250] Nutritional status: Diet Carb Modified Fluid consistency:: Thin; Room service appropriate?: Yes  Neurologic Exam: Mental Status: Alert, oriented, thought content appropriate. Speech fluent without evidence of aphasia. Able to follow 3 step commands without difficulty. Cranial Nerves: II: Discs flat bilaterally; Visual fields grossly normal, pupils equal, round, reactive to light and accommodation III,IV, VI: ptosis not present, extra-ocular motions intact bilaterally V,VII: smile symmetric, facial light touch sensation normal bilaterally VIII: hearing normal bilaterally IX,X: gag reflex present XI: bilateral shoulder shrug XII: midline tongue extension Motor: Right :Upper extremity 5/5Left: Upper extremity 5/5 Patient can lift each leg off the bed easily. Continues to be able to flex at the knee at about 3/5. Flicker of ankle flexion noted on the left Sensory: Continued altered sensation, mostly in the toes.   Lab Results: Basic Metabolic Panel:  Recent Labs Lab 04/26/16 0538  04/28/16 0506 04/29/16 0550 04/29/16 1618 04/29/16 2200 04/30/16 0706 05/01/16 0631 05/02/16 0540  NA 132*  < > 125* 130*  --  125*  129* 127* 129*  K 3.9  < > 3.6  --   --  4.0 4.0 3.9 4.2  CL 97*  < > 93*  --   --  94* 97* 95* 97*  CO2 27  < > 24  --   --  22 24 24 27   GLUCOSE 178*  < > 94  --   --  117* 95 78 120*  BUN 43*  < > 20  --   --  13 11 14 13   CREATININE 1.09*  < > 0.79  --   --  0.84 0.78 0.77 0.75  CALCIUM 7.5*  < > 7.6*  --   --  7.8* 7.9* 7.9* 8.7*  MG 1.8  --   --   --  1.4*  --   --   --   --   < > = values in this interval not displayed.  Liver Function Tests:  Recent Labs Lab 04/29/16 2200  AST 57*  ALT 29  ALKPHOS 156*  BILITOT 0.5  PROT 5.3*  ALBUMIN 1.5*   No results for input(s): LIPASE, AMYLASE in the last 168 hours. No results for input(s): AMMONIA in the last 168 hours.  CBC:  Recent Labs Lab 04/26/16 0538 04/28/16 0506 04/30/16 0809 05/01/16 0634 05/02/16 0540  WBC 6.0 10.9 15.8* 13.0* 10.6  NEUTROABS  --   --   --  11.4* 9.0*  HGB 9.6* 9.5* 10.2* 8.2* 8.7*  HCT 27.8* 28.4* 31.0* 24.5* 26.1*  MCV 82.3 83.3 83.5 81.3 82.5  PLT 151 201 315 279 317    Cardiac Enzymes:  Recent Labs Lab 04/26/16 0538  CKTOTAL 563*    Lipid Panel: No  results for input(s): CHOL, TRIG, HDL, CHOLHDL, VLDL, LDLCALC in the last 168 hours.  CBG:  Recent Labs Lab 05/01/16 0742 05/01/16 1136 05/01/16 1717 05/01/16 2113 05/02/16 0730  GLUCAP 74 120* 94 186* 119*    Microbiology: Results for orders placed or performed during the hospital encounter of 04/23/16  Blood culture (routine x 2)     Status: Abnormal   Collection Time: 04/23/16  4:35 PM  Result Value Ref Range Status   Specimen Description BLOOD RIGHT ANTECUBITAL  Final   Special Requests   Final    BOTTLES DRAWN AEROBIC AND ANAEROBIC  AER 7CC ANA 1CC   Culture  Setup Time   Final    GRAM NEGATIVE RODS IN BOTH AEROBIC AND ANAEROBIC BOTTLES CRITICAL RESULT CALLED TO, READ BACK BY AND VERIFIED WITH: Morton Stall @ [phone] 04/24/16 by [facility]    Culture (A)  Final    KLEBSIELLA PNEUMONIAE IN BOTH AEROBIC AND ANAEROBIC  BOTTLES    Report Status 04/26/2016 FINAL  Final   Organism ID, Bacteria KLEBSIELLA PNEUMONIAE  Final      Susceptibility   Klebsiella pneumoniae - MIC*    AMPICILLIN Value in next row Resistant      RESISTANT>=32    CEFAZOLIN Value in next row Sensitive      SENSITIVE<=4    CEFEPIME Value in next row Sensitive      SENSITIVE<=1    CEFTAZIDIME Value in next row Sensitive      SENSITIVE<=1    CEFTRIAXONE Value in next row Sensitive      SENSITIVE<=1    CIPROFLOXACIN Value in next row Sensitive      SENSITIVE<=0.25    GENTAMICIN Value in next row Sensitive      SENSITIVE<=1    IMIPENEM Value in next row Sensitive      SENSITIVE<=0.25    TRIMETH/SULFA Value in next row Sensitive      SENSITIVE<=20    AMPICILLIN/SULBACTAM Value in next row Sensitive      SENSITIVE8    PIP/TAZO Value in next row Sensitive      SENSITIVE<=4    Extended ESBL Value in next row Sensitive      SENSITIVE<=4    * KLEBSIELLA PNEUMONIAE  Blood Culture ID Panel (Reflexed)     Status: Abnormal   Collection Time: 04/23/16  4:35 PM  Result Value Ref Range Status   Enterococcus species NOT DETECTED NOT DETECTED Final   Vancomycin resistance NOT DETECTED NOT DETECTED Final   Listeria monocytogenes NOT DETECTED NOT DETECTED Final   Staphylococcus species NOT DETECTED NOT DETECTED Final   Staphylococcus aureus NOT DETECTED NOT DETECTED Final   Methicillin resistance NOT DETECTED NOT DETECTED Final   Streptococcus species NOT DETECTED NOT DETECTED Final   Streptococcus agalactiae NOT DETECTED NOT DETECTED Final   Streptococcus pneumoniae NOT DETECTED NOT DETECTED Final   Streptococcus pyogenes NOT DETECTED NOT DETECTED Final   Acinetobacter baumannii NOT DETECTED NOT DETECTED Final   Enterobacteriaceae species DETECTED (A) NOT DETECTED Final    Comment: CRITICAL RESULT CALLED TO, READ BACK BY AND VERIFIED WITH: Nate Cookson @ 0865 04/24/16 by [facility]    Enterobacter cloacae complex NOT DETECTED NOT DETECTED  Final   Escherichia coli NOT DETECTED NOT DETECTED Final   Klebsiella oxytoca NOT DETECTED NOT DETECTED Final   Klebsiella pneumoniae DETECTED (A) NOT DETECTED Final    Comment: CRITICAL RESULT CALLED TO, READ BACK BY AND VERIFIED WITHMorton Stall @ [phone] 04/24/16 by [facility]  Proteus species NOT DETECTED NOT DETECTED Final   Serratia marcescens NOT DETECTED NOT DETECTED Final   Carbapenem resistance NOT DETECTED NOT DETECTED Final   Haemophilus influenzae NOT DETECTED NOT DETECTED Final   Neisseria meningitidis NOT DETECTED NOT DETECTED Final   Pseudomonas aeruginosa NOT DETECTED NOT DETECTED Final   Candida albicans NOT DETECTED NOT DETECTED Final   Candida glabrata NOT DETECTED NOT DETECTED Final   Candida krusei NOT DETECTED NOT DETECTED Final   Candida parapsilosis NOT DETECTED NOT DETECTED Final   Candida tropicalis NOT DETECTED NOT DETECTED Final  Blood culture (routine x 2)     Status: Abnormal   Collection Time: 04/23/16  4:41 PM  Result Value Ref Range Status   Specimen Description BLOOD RIGHT ARM  Final   Special Requests   Final    BOTTLES DRAWN AEROBIC AND ANAEROBIC  AER 6CC ANA 2CC   Culture  Setup Time   Final    GRAM NEGATIVE RODS IN BOTH AEROBIC AND ANAEROBIC BOTTLES CRITICAL VALUE NOTED.  VALUE IS CONSISTENT WITH PREVIOUSLY REPORTED AND CALLED VALUE.    Culture (A)  Final    KLEBSIELLA PNEUMONIAE IN BOTH AEROBIC AND ANAEROBIC BOTTLES    Report Status 04/26/2016 FINAL  Final   Organism ID, Bacteria KLEBSIELLA PNEUMONIAE  Final      Susceptibility   Klebsiella pneumoniae - MIC*    AMPICILLIN >=32 RESISTANT Resistant     CEFAZOLIN <=4 SENSITIVE Sensitive     CEFEPIME <=1 SENSITIVE Sensitive     CEFTAZIDIME <=1 SENSITIVE Sensitive     CEFTRIAXONE <=1 SENSITIVE Sensitive     CIPROFLOXACIN <=0.25 SENSITIVE Sensitive     GENTAMICIN <=1 SENSITIVE Sensitive     IMIPENEM <=0.25 SENSITIVE Sensitive     TRIMETH/SULFA <=20 SENSITIVE Sensitive     AMPICILLIN/SULBACTAM 8  SENSITIVE Sensitive     PIP/TAZO <=4 SENSITIVE Sensitive     Extended ESBL NEGATIVE Sensitive     * KLEBSIELLA PNEUMONIAE  Urine culture     Status: Abnormal   Collection Time: 04/23/16  4:41 PM  Result Value Ref Range Status   Specimen Description URINE, CLEAN CATCH  Final   Special Requests NONE  Final   Culture >=100,000 COLONIES/mL KLEBSIELLA PNEUMONIAE (A)  Final   Report Status 04/25/2016 FINAL  Final   Organism ID, Bacteria KLEBSIELLA PNEUMONIAE (A)  Final      Susceptibility   Klebsiella pneumoniae - MIC*    AMPICILLIN >=32 RESISTANT Resistant     CEFAZOLIN <=4 SENSITIVE Sensitive     CEFTRIAXONE <=1 SENSITIVE Sensitive     CIPROFLOXACIN <=0.25 SENSITIVE Sensitive     GENTAMICIN <=1 SENSITIVE Sensitive     IMIPENEM <=0.25 SENSITIVE Sensitive     NITROFURANTOIN 32 SENSITIVE Sensitive     TRIMETH/SULFA <=20 SENSITIVE Sensitive     AMPICILLIN/SULBACTAM 8 SENSITIVE Sensitive     PIP/TAZO <=4 SENSITIVE Sensitive     Extended ESBL NEGATIVE Sensitive     * >=100,000 COLONIES/mL KLEBSIELLA PNEUMONIAE  MRSA PCR Screening     Status: Abnormal   Collection Time: 04/23/16  9:06 PM  Result Value Ref Range Status   MRSA by PCR POSITIVE (A) NEGATIVE Final    Comment:        The GeneXpert MRSA Assay (FDA approved for NASAL specimens only), is one component of a comprehensive MRSA colonization surveillance program. It is not intended to diagnose MRSA infection nor to guide or monitor treatment for MRSA infections. CRITICAL RESULT CALLED TO, READ BACK BY  AND VERIFIED WITH: Jaceion Aday LEWIS AT 2312 04/23/16.PMH   Wound culture     Status: None (Preliminary result)   Collection Time: 04/29/16  2:05 PM  Result Value Ref Range Status   Specimen Description LEG  Final   Special Requests Normal  Final   Gram Stain NO WBC SEEN NO ORGANISMS SEEN   Final   Culture NO GROWTH 3 DAYS  Final   Report Status PENDING  Incomplete  Urine culture     Status: None (Preliminary result)    Collection Time: 05/01/16  8:35 AM  Result Value Ref Range Status   Specimen Description URINE, RANDOM  Final   Special Requests Normal  Final   Culture CULTURE REINCUBATED FOR BETTER GROWTH  Final   Report Status PENDING  Incomplete  CULTURE, BLOOD (ROUTINE X 2) w Reflex to PCR ID Panel     Status: None (Preliminary result)   Collection Time: 05/01/16  9:05 AM  Result Value Ref Range Status   Specimen Description BLOOD RIGHT HAND  Final   Special Requests BOTTLES DRAWN AEROBIC AND ANAEROBIC  1CC  Final   Culture NO GROWTH < 12 HOURS  Final   Report Status PENDING  Incomplete  CULTURE, BLOOD (ROUTINE X 2) w Reflex to PCR ID Panel     Status: None (Preliminary result)   Collection Time: 05/01/16  9:11 AM  Result Value Ref Range Status   Specimen Description BLOOD RIGHT HAND  Final   Special Requests BOTTLES DRAWN AEROBIC AND ANAEROBIC  1CC  Final   Culture NO GROWTH < 12 HOURS  Final   Report Status PENDING  Incomplete    Coagulation Studies: No results for input(s): LABPROT, INR in the last 72 hours.  Imaging: Dg Chest 2 View  05/01/2016  CLINICAL DATA:  Confusion and generalized weakness. EXAM: CHEST  2 VIEW COMPARISON:  April 23, 2016 FINDINGS: Stable cardiomegaly. No pneumothorax. No pulmonary nodules, masses, or infiltrates. No acute abnormalities identified. IMPRESSION: No active cardiopulmonary disease. Electronically Signed   By: Gerome Sam III M.D   On: 05/01/2016 17:29    Medications:  I have reviewed the patient's current medications. Scheduled: . calcium citrate-vitamin D  1 tablet Oral BID  .  ceFAZolin (ANCEF) IV  2 g Intravenous Q8H  . docusate sodium  100 mg Oral BID  . doxycycline  100 mg Oral Q12H  . enoxaparin (LOVENOX) injection  40 mg Subcutaneous Q12H  . famotidine  20 mg Oral BID  . fluconazole  100 mg Oral Daily  . insulin aspart  0-15 Units Subcutaneous TID WC  . insulin aspart  0-5 Units Subcutaneous QHS  . insulin aspart  6 Units Subcutaneous  TID WC  . insulin glargine  42 Units Subcutaneous Q24H  . metoprolol tartrate  25 mg Oral BID  . nicotine  21 mg Transdermal Daily  . nystatin  5 mL Oral QID  . silver sulfADIAZINE   Topical Daily    Assessment/Plan: Patient initially improved quickly but has had no significant improvement beyond the current stage.  No evidence of cord involvement on initial imaging.  Although continued symptoms may be related to continuos compression on nerves already damaged by poorly controlled diabetes since patient was on the floor for so long.  Will rule out abnormalities that may not have showed up on previous imaging due to timing of imaging.   Recommendations: 1.  MRI of the lumbar spine without contrast 2.  Continue therapy 3.  Will change Trazodone  to nightly in an attempt to help with paresthesias.  This may be further adjusted on an outpatient basis.     LOS: 9 days   Thana Farr, MD Neurology [phone] 05/02/2016  10:11 AM

## 2016-05-03 LAB — WOUND CULTURE
CULTURE: NO GROWTH
Gram Stain: NONE SEEN
Special Requests: NORMAL

## 2016-05-03 NOTE — Progress Notes (Signed)
Microbiology has called me and notified about positive wound cultures from 05/01/2016 with Escherichia coli ESBL positive sensitive to gentamicin and imipenem. Discussed this result with infectious disease Dr. Sampson Goon, who thought possibly colonization as patient is clinically improving and white count was trending down prior to discharge. He has recommended to continue current antibiotics and will follow up with the patient. Not recommending to change antibiotics at this time. Appreciate Dr. Sampson Goon recommendations

## 2016-05-04 LAB — WOUND CULTURE

## 2016-05-04 LAB — BLOOD GAS, VENOUS
Acid-base deficit: 9.8 mmol/L — ABNORMAL HIGH (ref 0.0–2.0)
Bicarbonate: 16.7 mEq/L — ABNORMAL LOW (ref 21.0–28.0)
PCO2 VEN: 38 mmHg — AB (ref 44.0–60.0)
PH VEN: 7.25 — AB (ref 7.320–7.430)
Patient temperature: 37
pO2, Ven: <31 mmHg — ABNORMAL LOW (ref 31.0–45.0)

## 2016-05-04 LAB — URINE CULTURE
Culture: 20000 — AB
Special Requests: NORMAL

## 2016-05-06 LAB — CULTURE, BLOOD (ROUTINE X 2)
CULTURE: NO GROWTH
Culture: NO GROWTH

## 2016-05-18 DIAGNOSIS — N2889 Other specified disorders of kidney and ureter: Secondary | ICD-10-CM | POA: Insufficient documentation

## 2016-05-18 DIAGNOSIS — R29898 Other symptoms and signs involving the musculoskeletal system: Secondary | ICD-10-CM | POA: Insufficient documentation

## 2016-05-18 DIAGNOSIS — R600 Localized edema: Secondary | ICD-10-CM | POA: Insufficient documentation

## 2016-05-18 DIAGNOSIS — N10 Acute tubulo-interstitial nephritis: Secondary | ICD-10-CM | POA: Insufficient documentation

## 2016-05-18 DIAGNOSIS — G8220 Paraplegia, unspecified: Secondary | ICD-10-CM | POA: Insufficient documentation

## 2016-05-18 HISTORY — DX: Acute pyelonephritis: N10

## 2016-05-19 DIAGNOSIS — M25519 Pain in unspecified shoulder: Secondary | ICD-10-CM | POA: Insufficient documentation

## 2016-05-19 DIAGNOSIS — M25511 Pain in right shoulder: Secondary | ICD-10-CM

## 2016-05-19 HISTORY — DX: Pain in right shoulder: M25.511

## 2016-05-20 ENCOUNTER — Other Ambulatory Visit: Payer: Self-pay | Admitting: Unknown Physician Specialty

## 2016-05-20 DIAGNOSIS — M25511 Pain in right shoulder: Secondary | ICD-10-CM

## 2016-05-24 ENCOUNTER — Ambulatory Visit
Admission: RE | Admit: 2016-05-24 | Discharge: 2016-05-24 | Disposition: A | Discharge status: Home / Self Care | Payer: BLUE CROSS/BLUE SHIELD | Source: Ambulatory Visit | Attending: Unknown Physician Specialty | Admitting: Unknown Physician Specialty

## 2016-05-24 DIAGNOSIS — X58XXXA Exposure to other specified factors, initial encounter: Secondary | ICD-10-CM | POA: Insufficient documentation

## 2016-05-24 DIAGNOSIS — R938 Abnormal findings on diagnostic imaging of other specified body structures: Secondary | ICD-10-CM | POA: Insufficient documentation

## 2016-05-24 DIAGNOSIS — S42231A 3-part fracture of surgical neck of right humerus, initial encounter for closed fracture: Secondary | ICD-10-CM | POA: Insufficient documentation

## 2016-05-24 DIAGNOSIS — M25511 Pain in right shoulder: Secondary | ICD-10-CM | POA: Diagnosis present

## 2016-05-31 DIAGNOSIS — S42231G 3-part fracture of surgical neck of right humerus, subsequent encounter for fracture with delayed healing: Secondary | ICD-10-CM | POA: Insufficient documentation

## 2016-05-31 DIAGNOSIS — S42213A Unspecified displaced fracture of surgical neck of unspecified humerus, initial encounter for closed fracture: Secondary | ICD-10-CM | POA: Insufficient documentation

## 2016-06-13 ENCOUNTER — Other Ambulatory Visit
Admission: RE | Admit: 2016-06-13 | Discharge: 2016-06-13 | Disposition: A | Discharge status: Home / Self Care | Payer: Managed Care, Other (non HMO) | Source: Ambulatory Visit | Attending: Family Medicine | Admitting: Family Medicine

## 2016-06-13 DIAGNOSIS — N390 Urinary tract infection, site not specified: Secondary | ICD-10-CM | POA: Diagnosis not present

## 2016-06-13 LAB — CREATININE, SERUM
Creatinine, Ser: 0.71 mg/dL (ref 0.44–1.00)
GFR calc Af Amer: >60 mL/min (ref >60)
GFR calc non Af Amer: >60 mL/min (ref >60)

## 2016-06-13 LAB — VANCOMYCIN, TROUGH: VANCOMYCIN TR: 35 ug/mL — AB (ref 10–20)

## 2016-06-23 ENCOUNTER — Other Ambulatory Visit
Admission: RE | Admit: 2016-06-23 | Discharge: 2016-06-23 | Disposition: A | Discharge status: Home / Self Care | Payer: Managed Care, Other (non HMO) | Source: Ambulatory Visit | Attending: Family Medicine | Admitting: Family Medicine

## 2016-06-23 DIAGNOSIS — N390 Urinary tract infection, site not specified: Secondary | ICD-10-CM | POA: Insufficient documentation

## 2016-06-23 DIAGNOSIS — I10 Essential (primary) hypertension: Secondary | ICD-10-CM | POA: Insufficient documentation

## 2016-06-23 LAB — CREATININE, SERUM
CREATININE: 0.7 mg/dL (ref 0.44–1.00)
GFR calc Af Amer: >60 mL/min (ref >60)
GFR calc non Af Amer: >60 mL/min (ref >60)

## 2016-06-23 LAB — VANCOMYCIN, TROUGH: Vancomycin Tr: 21 ug/mL (ref 15–20)

## 2016-06-27 ENCOUNTER — Ambulatory Visit (INDEPENDENT_AMBULATORY_CARE_PROVIDER_SITE_OTHER): Payer: Managed Care, Other (non HMO) | Admitting: Urology

## 2016-06-27 ENCOUNTER — Encounter: Payer: Self-pay | Admitting: Urology

## 2016-06-27 VITALS — BP 108/67 | HR 80 | Ht 63.0 in | Wt 264.0 lb

## 2016-06-27 DIAGNOSIS — N390 Urinary tract infection, site not specified: Secondary | ICD-10-CM | POA: Diagnosis not present

## 2016-06-27 DIAGNOSIS — N2889 Other specified disorders of kidney and ureter: Secondary | ICD-10-CM | POA: Diagnosis not present

## 2016-06-27 LAB — URINALYSIS, COMPLETE
BILIRUBIN UA: NEGATIVE
GLUCOSE, UA: NEGATIVE
KETONES UA: NEGATIVE
NITRITE UA: NEGATIVE
Protein, UA: NEGATIVE
RBC UA: NEGATIVE
SPEC GRAV UA: 1.015 (ref 1.005–1.030)
UUROB: 0.2 mg/dL (ref 0.2–1.0)
pH, UA: 5.5 (ref 5.0–7.5)

## 2016-06-27 LAB — MICROSCOPIC EXAMINATION
Bacteria, UA: NONE SEEN
Epithelial Cells (non renal): >10 /hpf — AB (ref 0–10)

## 2016-06-27 NOTE — Progress Notes (Signed)
06/27/2016 3:58 PM   [name] [birth year] [id number]  Referring provider: Dortha Kern, MD [address] [place], [place] [address]  Chief Complaint  Patient presents with  . Renal Mass    New Patient  . Recurrent UTI    HPI: 55 year old female referred by Dr. Sampson Goon for further evaluation of possible renal mass.  She was recently admitted to the hospital with altered mental status, lower extremity weakness, and DKA in 04/2016. She was found to have urosepsis with Klebsiella and E. coli, acute renal failure, rhabdomyolysis in addition to the above.  Part of further workup for her extremity weakness, she underwent an MRI without contrast on 04/25/2016 which showed enlarged left paraaortic lymph node as well as irregular cystic lesions within her kidneys. Due to lack of contrast, these are not completely characterize nor was the study protocol to assess these lesions.  She underwent further workup with a CT abdomen pelvis with contrast for these renal lesions which revealed simple cyst in the left kidney along with 2 other ill-defined lesions, measuring 4.1 cm in the left anterior upper pole and 2.8 cm in the left interpolar region along with a 2.5 cm lesion in the left lower posterior pole.  He is also noted to have mild left bladder wall thickening consistent with her known history of cystitis.    She just completed course of vancomycin and Zosyn.    No further fevers.  No flank pain or gross hematuria.    She reports that she has numerous asymptomatic UTIs other than AMS.     PMHx significant for DM (poorly controlled), recent left LE cellulitis, paraplegia of unclear etiology.  She is currently living in in a nursing home and receives daily physical therapy.  Prior to her illness, she was living at home by herself, ambulatory, and independent.   PMH: Past Medical History  Diagnosis Date  . Diabetes mellitus without complication (HCC)   . Hypertension   . Arthritis   .  Asthma   . Hyperlipemia   . GERD (gastroesophageal reflux disease)   . Anxiety   . Glaucoma   . Hyperlipemia     Surgical History: Past Surgical History  Procedure Laterality Date  . Anterior cruciate ligament repair      Home Medications:    Medication List       This list is accurate as of: 06/27/16  3:58 PM.  Always use your most recent med list.               acetaminophen 325 MG tablet  Commonly known as:  TYLENOL  Take 2 tablets (650 mg total) by mouth every 6 (six) hours as needed for mild pain (or Fever >/= 101).     ALPRAZolam 0.5 MG tablet  Commonly known as:  XANAX  Take 1 tablet (0.5 mg total) by mouth 2 (two) times daily as needed for anxiety or sleep.     atorvastatin 20 MG tablet  Commonly known as:  LIPITOR  Take 20 mg by mouth at bedtime.     bisacodyl 5 MG EC tablet  Commonly known as:  DULCOLAX  Take 1 tablet (5 mg total) by mouth daily as needed for moderate constipation.     docusate sodium 100 MG capsule  Commonly known as:  COLACE  Take 1 capsule (100 mg total) by mouth 2 (two) times daily.     famotidine 20 MG tablet  Commonly known as:  PEPCID  Take 1 tablet (  20 mg total) by mouth 2 (two) times daily.     ferrous sulfate 325 (65 FE) MG tablet  Take by mouth.     furosemide 20 MG tablet  Commonly known as:  LASIX  Take 1 tablet (20 mg total) by mouth daily.     HYDROcodone-acetaminophen 5-325 MG tablet  Commonly known as:  NORCO/VICODIN  Take 1-2 tablets by mouth every 4 (four) hours as needed for moderate pain.     insulin aspart 100 UNIT/ML injection  Commonly known as:  novoLOG  Inject 6 Units into the skin 3 (three) times daily with meals.     insulin glargine 100 UNIT/ML injection  Commonly known as:  LANTUS  Inject 0.42 mLs (42 Units total) into the skin daily.     ipratropium-albuterol 0.5-2.5 (3) MG/3ML Soln  Commonly known as:  DUONEB  Take 3 mLs by nebulization 4 (four) times daily as needed. For wheezing.      latanoprost 0.005 % ophthalmic solution  Commonly known as:  XALATAN  Place 1 drop into both eyes at bedtime.     magnesium oxide 400 MG tablet  Commonly known as:  MAG-OX  Take by mouth.     metoprolol tartrate 25 MG tablet  Commonly known as:  LOPRESSOR  Take 1 tablet (25 mg total) by mouth 2 (two) times daily.     oxybutynin 10 MG 24 hr tablet  Commonly known as:  DITROPAN-XL  Take 10 mg by mouth at bedtime.     oxyCODONE-acetaminophen 5-325 MG tablet  Commonly known as:  PERCOCET/ROXICET  Take by mouth.     pregabalin 200 MG capsule  Commonly known as:  LYRICA  Take 200 mg by mouth 2 (two) times daily.     traZODone 50 MG tablet  Commonly known as:  DESYREL  Take 1 tablet (50 mg total) by mouth at bedtime.        Allergies:  Allergies  Allergen Reactions  . Biaxin [Clarithromycin] Rash    Patient states this medication gives her severe rash and thrush in the mouth.    Family History: Family History  Problem Relation Age of Onset  . Kidney cancer Neg Hx   . Prostate cancer Neg Hx     Social History:  reports that she has been smoking.  She does not have any smokeless tobacco history on file. She reports that she does not drink alcohol. Her drug history is not on file.  ROS: UROLOGY Frequent Urination?: No Hard to postpone urination?: No Burning/pain with urination?: No Get up at night to urinate?: Yes Leakage of urine?: No Urine stream starts and stops?: No Trouble starting stream?: No Do you have to strain to urinate?: No Blood in urine?: No Urinary tract infection?: Yes Sexually transmitted disease?: No Injury to kidneys or bladder?: No Painful intercourse?: No Weak stream?: No Currently pregnant?: No Vaginal bleeding?: No Last menstrual period?: n  Gastrointestinal Nausea?: No Vomiting?: No Indigestion/heartburn?: No Diarrhea?: No Constipation?: No  Constitutional Fever: No Night sweats?: No Weight loss?: No Fatigue?:  No  Skin Skin rash/lesions?: No Itching?: No  Eyes Blurred vision?: No Double vision?: No  Ears/Nose/Throat Sore throat?: No Sinus problems?: No  Hematologic/Lymphatic Swollen glands?: No Easy bruising?: No  Cardiovascular Leg swelling?: No Chest pain?: No  Respiratory Cough?: No Shortness of breath?: No  Endocrine Excessive thirst?: No  Musculoskeletal Back pain?: No Joint pain?: No  Neurological Headaches?: No Dizziness?: No  Psychologic Depression?: No Anxiety?: No  Physical Exam:  BP 108/67 mmHg  Pulse 80  Ht 5\' 3"  (1.6 m)  Wt 264 lb (119.75 kg)  BMI 46.78 kg/m2  Constitutional:  Alert and oriented, No acute distress.  In wheel chair. HEENT: [place] AT, moist mucus membranes.  Trachea midline, no masses. Cardiovascular: No clubbing, cyanosis.  1+ pitting edema bilaterally. Respiratory: Normal respiratory effort, no increased work of breathing. GI: Abdomen is soft, nontender, nondistended, no abdominal masses.  Obese. GU: No CVA tenderness.  Skin: No rashes, bruises or suspicious lesions. Lymph: No cervical adenopathy. Neurologic: Grossly intact.  Nonambulatory due to LE weakness. Psychiatric: Normal mood and affect.  Laboratory Data: Lab Results  Component Value Date   WBC 10.6 05/02/2016   HGB 8.7* 05/02/2016   HCT 26.1* 05/02/2016   MCV 82.5 05/02/2016   PLT 317 05/02/2016    Lab Results  Component Value Date   CREATININE 0.70 06/23/2016    Lab Results  Component Value Date   HGBA1C 11.1* 04/24/2016    Urinalysis    Component Value Date/Time   COLORURINE YELLOW* 04/23/2016 1641   APPEARANCEUR CLOUDY* 04/23/2016 1641   LABSPEC 1.016 04/23/2016 1641   PHURINE 5.0 04/23/2016 1641   GLUCOSEU >500* 04/23/2016 1641   HGBUR 3+* 04/23/2016 1641   BILIRUBINUR NEGATIVE 04/23/2016 1641   KETONESUR TRACE* 04/23/2016 1641   PROTEINUR 30* 04/23/2016 1641   NITRITE NEGATIVE 04/23/2016 1641   LEUKOCYTESUR 2+* 04/23/2016 1641    Pertinent  Imaging: Study Result     CLINICAL DATA: Sepsis, possible renal cysts on lumbar spine MRI  EXAM: CT ABDOMEN AND PELVIS WITH CONTRAST  TECHNIQUE: Multidetector CT imaging of the abdomen and pelvis was performed using the standard protocol following bolus administration of intravenous contrast.  CONTRAST: 04/25/2016 ISOVUE-300 IOPAMIDOL (ISOVUE-300) INJECTION 61%  COMPARISON: Lumbar spine MRI dated 04/25/2016  FINDINGS: Lower chest: Mild scarring/ atelectasis at the left lung base.  Hepatobiliary: Liver is within normal limits. No suspicious/enhancing hepatic lesions.  Gallbladder is unremarkable. No intrahepatic or extrahepatic ductal dilatation.  Pancreas: Within normal limits.  Spleen: Within normal limits.  Adrenals/Urinary Tract: Adrenal glands are within normal limits.  Right kidney is within normal limits.  Left kidney is notable for a 3.1 x 2.6 cm simple cyst along the medial left upper pole (series 2/ image 36), corresponding to the MRI abnormality. Additional 2.3 cm simple cyst along the anterior left lower kidney (series 2/image 55).  However, there is multifocal heterogeneous perfusion of the upper pole and interpolar region in the left kidney, with possible multiple underlying lesions. For example, there is a possible 3.5 x 4.1 cm lesion anteriorly in the left upper pole (series 2/ image 39), a 2.8 x 2.8 cm lesion in the posterior interpolar region (series 2/ image 42), two additional smaller lesions in the posterior interpolar left kidney (series 2/ image 42), and a possible 2.1 x 2.5 cm lesion in the posterior left lower kidney (series 2/ image 46). Overall, this appearance suggests pyelonephritis or a neoplastic process such as lymphoma.  No hydronephrosis.  Bladder is notable for mild left bladder wall thickening (series 2/image 84).  Stomach/Bowel: Stomach is within normal limits.  No evidence of bowel obstruction.  Normal  appendix (series 2/image 67).  Vascular/Lymphatic: Atherosclerotic calcifications of the abdominal aorta and branch vessels. No evidence of abdominal aortic aneurysm.  Small left retroperitoneal lymph nodes, including a mildly enlarged 15 mm short axis left para-aortic node (series 2/ image 44).  Reproductive: Uterus is within normal limits.  Bilateral ovaries are unremarkable.  Other: Small volume pelvic ascites.  Musculoskeletal: Mild degenerative changes of the lower thoracic spine.  IMPRESSION: MRI abnormality corresponds to a 3.1 cm simple cyst in the medial left upper kidney.  Multifocal heterogeneous perfusion of the left kidney with possible multiple underlying lesions, measuring up to 4.1 cm, as above. Differential considerations include pyelonephritis or a neoplastic process such as lymphoma.  Associated 15 mm short axis left para-aortic node.  Correlate for signs/symptoms of pyelonephritis. Follow-up CT or MRI abdomen with/without contrast is suggested in 4 weeks.   Electronically Signed  By: Charline Bills M.D.  On: 04/27/2016 18:14     CT scan personally reviewed today and with the patient  Assessment & Plan:    1. Left renal mass Multiple ill-defined left renal masses in the setting of fevers, positive urine culture growing Klebsiella and ESBL Escherichia coli.  Previous CT scan from 04/2016 not specifically protocol to assess renal masses.  Given her clinical picture at the time of CT scan, I highly suspect infectious etiology find it prudent to follow up to assess for any underlying masses.  I recommended follow-up with CT abdomen with and without contrast. Patient's renal function has normalized.  She is agreeable with this plan.  - Urinalysis, Complete - CT Abd Wo & W Cm; Future  2. Recurrent UTI History of "recurrent" UTIs which manifests with altered mental status and no other urinary symptoms. Given her habitus, I suspect  that she is unable to provide a clean catch specimen.  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  She followed by infectious disease, Dr. Sampson Goon.  Discussed that moving forward, I would only like her to have catheterized specimens with urine cultures prior to any treatment or to assess for true infections. In addition, these must be symptomatic.  Return in about 4 weeks (around 07/25/2016) for f/u CT scan.  Vanna Scotland, MD  [facility] [address] [place], [place] [address] [phone]

## 2016-07-01 ENCOUNTER — Encounter: Payer: Managed Care, Other (non HMO) | Attending: Surgery | Admitting: Surgery

## 2016-07-01 DIAGNOSIS — L89323 Pressure ulcer of left buttock, stage 3: Secondary | ICD-10-CM | POA: Insufficient documentation

## 2016-07-01 DIAGNOSIS — I10 Essential (primary) hypertension: Secondary | ICD-10-CM | POA: Diagnosis not present

## 2016-07-01 DIAGNOSIS — F411 Generalized anxiety disorder: Secondary | ICD-10-CM | POA: Diagnosis not present

## 2016-07-01 DIAGNOSIS — F17210 Nicotine dependence, cigarettes, uncomplicated: Secondary | ICD-10-CM | POA: Insufficient documentation

## 2016-07-01 DIAGNOSIS — G8222 Paraplegia, incomplete: Secondary | ICD-10-CM | POA: Diagnosis not present

## 2016-07-01 DIAGNOSIS — E11622 Type 2 diabetes mellitus with other skin ulcer: Secondary | ICD-10-CM | POA: Insufficient documentation

## 2016-07-01 DIAGNOSIS — K219 Gastro-esophageal reflux disease without esophagitis: Secondary | ICD-10-CM | POA: Insufficient documentation

## 2016-07-01 DIAGNOSIS — G8929 Other chronic pain: Secondary | ICD-10-CM | POA: Insufficient documentation

## 2016-07-01 DIAGNOSIS — L89313 Pressure ulcer of right buttock, stage 3: Secondary | ICD-10-CM | POA: Diagnosis not present

## 2016-07-01 DIAGNOSIS — H409 Unspecified glaucoma: Secondary | ICD-10-CM | POA: Diagnosis not present

## 2016-07-01 DIAGNOSIS — J449 Chronic obstructive pulmonary disease, unspecified: Secondary | ICD-10-CM | POA: Insufficient documentation

## 2016-07-01 DIAGNOSIS — N10 Acute tubulo-interstitial nephritis: Secondary | ICD-10-CM | POA: Insufficient documentation

## 2016-07-02 NOTE — Progress Notes (Signed)
MARINDA, TYER ([id number]) Visit Report for 07/01/2016 Abuse/Suicide Risk Screen Details Patient Name: [name], [name] Date of Service: 07/01/2016 9:30 AM Medical Record Number: [id number] Patient Account Number: [id number] Date of Birth/Sex: 17-Nov-1961 (55 y.o. Female) Treating RN: Huel Coventry Primary Care Physician: Joen [name] Other Clinician: Referring Physician: Treating Physician/Extender: Rudene Re in Treatment: 0 Abuse/Suicide Risk Screen Items Answer ABUSE/SUICIDE RISK SCREEN: Has anyone close to you tried to hurt or harm you recentlyo No Do you feel uncomfortable with anyone in your familyo No Has anyone forced you do things that you didnot want to doo No Do you have any thoughts of harming yourselfo No Patient displays signs or symptoms of abuse and/or neglect. No Electronic Signature(s) Signed: 07/01/2016 4:56:16 PM By: Elliot Gurney, RN, BSN, Kim RN, BSN Entered By: Elliot Gurney, RN, BSN, Kim on 07/01/2016 09:54:54 Nelis, [name] ([id number]) -------------------------------------------------------------------------------- Activities of Daily Living Details Patient Name: [name] Date of Service: 07/01/2016 9:30 AM Medical Record Number: [id number] Patient Account Number: [id number] Date of Birth/Sex: [birth year] (55 y.o. Female) Treating RN: Huel Coventry Primary Care Physician: Joen [name] Other Clinician: Referring Physician: Treating Physician/Extender: Rudene Re in Treatment: 0 Activities of Daily Living Items Answer Activities of Daily Living (Please select one for each item) Drive Automobile Not Able Take Medications Need Assistance Use Telephone Need Assistance Care for Appearance Need Assistance Use Toilet Need Assistance Bath / Shower Need Assistance Dress Self Need Assistance Feed Self Need Assistance Walk Need Assistance Get In / Out Bed Need Assistance Housework Need Assistance Prepare Meals Need Assistance Handle Money Need Assistance Shop for  Self Need Assistance Electronic Signature(s) Signed: 07/01/2016 4:56:16 PM By: Elliot Gurney, RN, BSN, Kim RN, BSN Entered By: Elliot Gurney, RN, BSN, Kim on 07/01/2016 09:55:07 Dix, [name] ([id number]) -------------------------------------------------------------------------------- Education Assessment Details Patient Name: [name] Date of Service: 07/01/2016 9:30 AM Medical Record Number: [id number] Patient Account Number: [id number] Date of Birth/Sex: March 05, 1961 (55 y.o. Female) Treating RN: Huel Coventry Primary Care Physician: Joen [name] Other Clinician: Referring Physician: Treating Physician/Extender: Rudene Re in Treatment: 0 Primary Learner Assessed: Patient Learning Preferences/Education Level/Primary Language Learning Preference: Explanation, Demonstration Highest Education Level: High School Preferred Language: English Cognitive Barrier Assessment/Beliefs Language Barrier: No Translator Needed: No Memory Deficit: No Emotional Barrier: No Cultural/Religious Beliefs Affecting Medical No Care: Physical Barrier Assessment Impaired Vision: Yes Glasses Impaired Hearing: No Decreased Hand dexterity: No Knowledge/Comprehension Assessment Knowledge Level: High Comprehension Level: High Ability to understand written High instructions: Ability to understand verbal High instructions: Motivation Assessment Anxiety Level: Calm Cooperation: Cooperative Education Importance: Acknowledges Need Interest in Health Problems: Asks Questions Perception: Coherent Willingness to Engage in Self- High Management Activities: Readiness to Engage in Self- High Management Activities: Electronic Signature(s) AZELYN, BATIE ([id number]) Signed: 07/01/2016 4:56:16 PM By: Elliot Gurney RN, BSN, Kim RN, BSN Entered By: Elliot Gurney, RN, BSN, Kim on 07/01/2016 09:55:35 Stough, [name] ([id number]) -------------------------------------------------------------------------------- Fall Risk Assessment  Details Patient Name: [name] Date of Service: 07/01/2016 9:30 AM Medical Record Number: [id number] Patient Account Number: [id number] Date of Birth/Sex: [birth year] (55 y.o. Female) Treating RN: Huel Coventry Primary Care Physician: Joen [name] Other Clinician: Referring Physician: Treating Physician/Extender: Rudene Re in Treatment: 0 Fall Risk Assessment Items Have you had 2 or more falls in the last 12 monthso 0 Yes Have you had any fall that resulted in injury in the last 12 monthso 0 Yes FALL RISK ASSESSMENT: History of falling - immediate or within 3 months 25 Yes Secondary diagnosis 0 No  Ambulatory aid None/bed rest/wheelchair/nurse 0 Yes Crutches/cane/walker 0 No Furniture 0 No IV Access/Saline Lock 0 No Gait/Training Normal/bed rest/immobile 0 Yes Weak 0 No Impaired 0 No Mental Status Oriented to own ability 0 Yes Electronic Signature(s) Signed: 07/01/2016 4:56:16 PM By: Elliot Gurney, RN, BSN, Kim RN, BSN Entered By: Elliot Gurney, RN, BSN, Kim on 07/01/2016 09:56:00 Wanek, [name] ([id number]) -------------------------------------------------------------------------------- Nutrition Risk Assessment Details Patient Name: [name] Date of Service: 07/01/2016 9:30 AM Medical Record Number: [id number] Patient Account Number: [id number] Date of Birth/Sex: 1961-01-29 (55 y.o. Female) Treating RN: Huel Coventry Primary Care Physician: Joen [name] Other Clinician: Referring Physician: Treating Physician/Extender: Rudene Re in Treatment: 0 Height (in): 64 Weight (lbs): 249 Body Mass Index (BMI): 42.7 Nutrition Risk Assessment Items NUTRITION RISK SCREEN: I have an illness or condition that made me change the kind and/or 0 No amount of food I eat I eat fewer than two meals per day 0 No I eat few fruits and vegetables, or milk products 0 No I have three or more drinks of beer, liquor or wine almost every day 0 No I have tooth or mouth problems that make it  hard for me to eat 0 No I don't always have enough money to buy the food I need 0 No I eat alone most of the time 0 No I take three or more different prescribed or over-the-counter drugs a 0 No day Without wanting to, I have lost or gained 10 pounds in the last six 0 No months I am not always physically able to shop, cook and/or feed myself 0 No Nutrition Protocols Good Risk Protocol Provide education on elevated blood sugars Moderate Risk Protocol 0 and impact on wound healing, as applicable Electronic Signature(s) Signed: 07/01/2016 4:56:16 PM By: Elliot Gurney, RN, BSN, Kim RN, BSN Entered By: Elliot Gurney, RN, BSN, Kim on 07/01/2016 09:56:10

## 2016-07-02 NOTE — Progress Notes (Signed)
[name], [facility] ([id number]) Visit Report for 07/01/2016 Allergy List Details Patient Name: [name], [name] Date of Service: 07/01/2016 9:30 AM Medical Record Number: [id number] Patient Account Number: [id number] Date of Birth/Sex: 04-12-1961 (55 y.o. Female) Treating RN: Huel Coventry Primary Care Physician: Joen [name] Other Clinician: Referring Physician: Treating Physician/Extender: Rudene Re in Treatment: 0 Allergies Active Allergies Biaxin Allergy Notes Electronic Signature(s) Signed: 07/01/2016 4:56:16 PM By: Elliot Gurney, RN, BSN, Kim RN, BSN Entered By: Elliot Gurney, RN, BSN, Kim on 07/01/2016 09:49:05 Sisley, [name] ([id number]) -------------------------------------------------------------------------------- Arrival Information Details Patient Name: [name] Date of Service: 07/01/2016 9:30 AM Medical Record Number: [id number] Patient Account Number: [id number] Date of Birth/Sex: 22-Sep-1961 (55 y.o. Female) Treating RN: Huel Coventry Primary Care Physician: Joen [name] Other Clinician: Referring Physician: Treating Physician/Extender: Rudene Re in Treatment: 0 Visit Information Patient Arrived: Wheel Chair Arrival Time: 09:44 Accompanied By: caregiver Transfer Assistance: Manual Patient Identification Verified: Yes Secondary Verification Process Yes Completed: Patient Requires Transmission-Based No Precautions: Patient Has Alerts: Yes Patient Alerts: DM II Electronic Signature(s) Signed: 07/01/2016 4:56:16 PM By: Elliot Gurney, RN, BSN, Kim RN, BSN Entered By: Elliot Gurney, RN, BSN, Kim on 07/01/2016 09:45:41 Lomas, [name] ([id number]) -------------------------------------------------------------------------------- Clinic Level of Care Assessment Details Patient Name: [name] Date of Service: 07/01/2016 9:30 AM Medical Record Number: [id number] Patient Account Number: [id number] Date of Birth/Sex: Jul 20, 1961 (55 y.o. Female) Treating RN: Huel Coventry Primary Care Physician:  Joen [name] Other Clinician: Referring Physician: Treating Physician/Extender: Rudene Re in Treatment: 0 Clinic Level of Care Assessment Items TOOL 2 Quantity Score []  - Use when only an EandM is performed on the INITIAL visit 0 ASSESSMENTS - Nursing Assessment / Reassessment X - General Physical Exam (combine w/ comprehensive assessment (listed just 1 20 below) when performed on new pt. evals) X - Comprehensive Assessment (HX, ROS, Risk Assessments, Wounds Hx, etc.) 1 25 ASSESSMENTS - Wound and Skin Assessment / Reassessment []  - Simple Wound Assessment / Reassessment - one wound 0 X - Complex Wound Assessment / Reassessment - multiple wounds 1 5 []  - Dermatologic / Skin Assessment (not related to wound area) 0 ASSESSMENTS - Ostomy and/or Continence Assessment and Care []  - Incontinence Assessment and Management 0 []  - Ostomy Care Assessment and Management (repouching, etc.) 0 PROCESS - Coordination of Care X - Simple Patient / Family Education for ongoing care 1 15 []  - Complex (extensive) Patient / Family Education for ongoing care 0 []  - Staff obtains Chiropractor, Records, Test Results / Process Orders 0 []  - Staff telephones HHA, Nursing Homes / Clarify orders / etc 0 []  - Routine Transfer to another Facility (non-emergent condition) 0 []  - Routine Hospital Admission (non-emergent condition) 0 []  - New Admissions / Manufacturing engineer / Ordering NPWT, Apligraf, etc. 0 []  - Emergency Hospital Admission (emergent condition) 0 X - Simple Discharge Coordination 1 10 Purdy, Tsion L. ([id number]) []  - Complex (extensive) Discharge Coordination 0 PROCESS - Special Needs []  - Pediatric / Minor Patient Management 0 []  - Isolation Patient Management 0 []  - Hearing / Language / Visual special needs 0 []  - Assessment of Community assistance (transportation, D/C planning, etc.) 0 []  - Additional assistance / Altered mentation 0 []  - Support Surface(s) Assessment (bed,  cushion, seat, etc.) 0 INTERVENTIONS - Wound Cleansing / Measurement X - Wound Imaging (photographs - any number of wounds) 1 5 []  - Wound Tracing (instead of photographs) 0 X - Simple Wound Measurement - one wound 1 5 []  - Complex Wound Measurement -  multiple wounds 0 X - Simple Wound Cleansing - one wound 1 5 []  - Complex Wound Cleansing - multiple wounds 0 INTERVENTIONS - Wound Dressings []  - Small Wound Dressing one or multiple wounds 0 X - Medium Wound Dressing one or multiple wounds 1 15 []  - Large Wound Dressing one or multiple wounds 0 []  - Application of Medications - injection 0 INTERVENTIONS - Miscellaneous []  - External ear exam 0 []  - Specimen Collection (cultures, biopsies, blood, body fluids, etc.) 0 []  - Specimen(s) / Culture(s) sent or taken to Lab for analysis 0 []  - Patient Transfer (multiple staff / Lift / Similar devices) 0 []  - Simple Staple / Suture removal (25 or less) 0 []  - Complex Staple / Suture removal (26 or more) 0 Shaub, Analaya L. ( ) []  - Hypo / Hyperglycemic Management (close monitor of Blood Glucose) 0 []  - Ankle / Brachial Index (ABI) - do not check if billed separately 0 Has the patient been seen at the hospital within the last three years: Yes Total Score: 105 Level Of Care: New/Established - Level 3 Electronic Signature(s) Signed: 07/01/2016 4:56:16 PM By: , RN, BSN, Kim RN, BSN Entered By: , RN, BSN, Kim on 07/01/2016 10:44:23 Bartell, ( ) -------------------------------------------------------------------------------- Encounter Discharge Information Details Patient Name: [name] Date of Service: 07/01/2016 9:30 AM Medical Record Number: Patient Account Number: [id number] Date of Birth/Sex: [birth year] (55 y.o. Female) Treating RN: 09/01/2016 Primary Care Physician: Elliot Gurney Other Clinician: Referring Physician: Treating Physician/Extender: Elliot Gurney in Treatment: 0 Encounter  Discharge Information Items Discharge Pain Level: 0 Discharge Condition: Stable Ambulatory Status: Wheelchair Discharge Destination: Home Transportation: Private Auto Accompanied By: caregiver Schedule Follow-up Appointment: Yes Medication Reconciliation completed Yes and provided to Patient/Care Lachina Salsberry: Provided on Clinical Summary of Care: 07/01/2016 Form Type Recipient Paper Patient TW Electronic Signature(s) Signed: 07/01/2016 10:50:01 AM By: [id number] Entered By: [name] on 07/01/2016 10:50:00 Endsley, [id number] ([id number]) -------------------------------------------------------------------------------- Multi Wound Chart Details Patient Name: 03/27/1961 Date of Service: 07/01/2016 9:30 AM Medical Record Number: Huel Coventry Patient Account Number: Joen [name] Date of Birth/Sex: Jul 16, 1961 (55 y.o. Female) Treating RN: 09/01/2016 Primary Care Physician: Gwenlyn Perking Other Clinician: Referring Physician: Treating Physician/Extender: Gwenlyn Perking in Treatment: 0 Vital Signs Height(in): 64 Pulse(bpm): 86 Weight(lbs): 249 Blood Pressure 129/71 (mmHg): Body Mass Index(BMI): 43 Temperature(F): 98.7 Respiratory Rate 18 (breaths/min): Photos: [1:No Photos] [2:No Photos] [N/A:N/A] Wound Location: [1:Right Gluteal fold] [2:Left Gluteal fold] [N/A:N/A] Wounding Event: [1:Pressure Injury] [2:Pressure Injury] [N/A:N/A] Primary Etiology: [1:Pressure Ulcer] [2:Pressure Ulcer] [N/A:N/A] Comorbid History: [1:Asthma, Hypertension, Type II Diabetes, Neuropathy] [2:Asthma, Hypertension, Type II Diabetes, Neuropathy] [N/A:N/A] Date Acquired: [1:03/26/2016] [2:03/26/2016] [N/A:N/A] Weeks of Treatment: [1:0] [2:0] [N/A:N/A] Wound Status: [1:Open] [2:Open] [N/A:N/A] Measurements L x W x D 7.8x4.2x1.5 [2:8x10x2.4] [N/A:N/A] (cm) Area (cm) : [1:25.73] [2:62.832] [N/A:N/A] Volume (cm) : [1:38.594] [2:150.796] [N/A:N/A] Position 1 (o'clock): 2 Maximum Distance 1  1.8 (cm): Tunneling: [1:Yes] [2:No] [N/A:N/A] Classification: [1:Category/Stage II] [2:Category/Stage III] [N/A:N/A] Exudate Amount: [1:Large] [2:Large] [N/A:N/A] Exudate Type: [1:Serous] [2:Serous] [N/A:N/A] Exudate Color: [1:amber] [2:amber] [N/A:N/A] Wound Margin: [1:Flat and Intact] [2:Flat and Intact] [N/A:N/A] Granulation Amount: [1:Medium (34-66%)] [2:Medium (34-66%)] [N/A:N/A] Granulation Quality: [1:Pale, Hyper-granulation] [2:Pink] [N/A:N/A] Necrotic Amount: [1:Medium (34-66%)] [2:Medium (34-66%)] [N/A:N/A] Exposed Structures: [1:Fascia: No Fat: No Tendon: No Muscle: No] [2:Fascia: No Fat: No Tendon: No Muscle: No] [N/A:N/A] Joint: No Joint: No Bone: No Bone: No Limited to Skin Limited to Skin Breakdown Breakdown Periwound Skin Texture: Scarring: Yes Edema: No N/A Edema:  No Excoriation: No Excoriation: No Induration: No Induration: No Callus: No Callus: No Crepitus: No Crepitus: No Fluctuance: No Fluctuance: No Friable: No Friable: No Rash: No Rash: No Scarring: No Periwound Skin Moist: Yes Maceration: No N/A Moisture: Maceration: No Moist: No Dry/Scaly: No Dry/Scaly: No Periwound Skin Color: Atrophie Blanche: No Atrophie Blanche: No N/A Cyanosis: No Cyanosis: No Ecchymosis: No Ecchymosis: No Erythema: No Erythema: No Hemosiderin Staining: No Hemosiderin Staining: No Mottled: No Mottled: No Pallor: No Pallor: No Rubor: No Rubor: No Tenderness on No No N/A Palpation: Wound Preparation: Ulcer Cleansing: Ulcer Cleansing: N/A Rinsed/Irrigated with Rinsed/Irrigated with Saline Saline Topical Anesthetic Topical Anesthetic Applied: Other: lidocaine Applied: Other: 4% LIDOCAINE 4% Treatment Notes Electronic Signature(s) Signed: 07/01/2016 4:56:16 PM By: Elliot Gurney, RN, BSN, Kim RN, BSN Entered By: Elliot Gurney, RN, BSN, Kim on 07/01/2016 10:15:24 Christoffel, [name]  ([id number]) -------------------------------------------------------------------------------- Multi-Disciplinary Care Plan Details Patient Name: [name] Date of Service: 07/01/2016 9:30 AM Medical Record Number: [id number] Patient Account Number: [id number] Date of Birth/Sex: 1961-07-08 (55 y.o. Female) Treating RN: Huel Coventry Primary Care Physician: Joen [name] Other Clinician: Referring Physician: Treating Physician/Extender: Rudene Re in Treatment: 0 Active Inactive Electronic Signature(s) Signed: 07/01/2016 4:56:16 PM By: Elliot Gurney, RN, BSN, Kim RN, BSN Entered By: Elliot Gurney, RN, BSN, Kim on 07/01/2016 12:59:00 Gemme, [name] ([id number]) -------------------------------------------------------------------------------- Pain Assessment Details Patient Name: [name] Date of Service: 07/01/2016 9:30 AM Medical Record Number: [id number] Patient Account Number: [id number] Date of Birth/Sex: [birth year] (56 y.o. Female) Treating RN: Huel Coventry Primary Care Physician: Joen [name] Other Clinician: Referring Physician: Treating Physician/Extender: Rudene Re in Treatment: 0 Active Problems Location of Pain Severity and Description of Pain Patient Has Paino No Site Locations With Dressing Change: No Pain Management and Medication Current Pain Management: Electronic Signature(s) Signed: 07/01/2016 4:56:16 PM By: Elliot Gurney, RN, BSN, Kim RN, BSN Entered By: Elliot Gurney, RN, BSN, Kim on 07/01/2016 09:45:51 [place], [name] ([id number]) -------------------------------------------------------------------------------- Patient/Caregiver Education Details Patient Name: [name] Date of Service: 07/01/2016 9:30 AM Medical Record Number: [id number] Patient Account Number: [id number] Date of Birth/Gender: Jun 16, 1961 (55 y.o. Female) Treating RN: Huel Coventry Primary Care Physician: Joen [name] Other Clinician: Referring Physician: Treating Physician/Extender: Rudene Re in  Treatment: 0 Education Assessment Education Provided To: Patient and Caregiver Education Topics Provided Wound/Skin Impairment: Handouts: Caring for Your Ulcer Methods: Demonstration, Explain/Verbal, Printed Responses: State content correctly Electronic Signature(s) Signed: 07/01/2016 4:56:16 PM By: Elliot Gurney, RN, BSN, Kim RN, BSN Entered By: Elliot Gurney, RN, BSN, Kim on 07/01/2016 10:47:00 Craton, [name] ([id number]) -------------------------------------------------------------------------------- Wound Assessment Details Patient Name: [name] Date of Service: 07/01/2016 9:30 AM Medical Record Number: [id number] Patient Account Number: [id number] Date of Birth/Sex: 1961/11/03 (55 y.o. Female) Treating RN: Huel Coventry Primary Care Physician: Joen [name] Other Clinician: Referring Physician: Treating Physician/Extender: Rudene Re in Treatment: 0 Wound Status Wound Number: 1 Primary Pressure Ulcer Etiology: Wound Location: Right Gluteal fold Wound Status: Open Wounding Event: Pressure Injury Comorbid Asthma, Hypertension, Type II Date Acquired: 03/26/2016 History: Diabetes, Neuropathy Weeks Of Treatment: 0 Clustered Wound: No Photos Wound Measurements Length: (cm) 7.8 Width: (cm) 4.2 Depth: (cm) 1.5 Area: (cm) 25.73 Volume: (cm) 38.594 % Reduction in Area: 0% % Reduction in Volume: 0% Tunneling: Yes Position (o'clock): 2 Maximum Distance: (cm) 1.8 Wound Description Classification: Category/Stage II Wound Margin: Flat and Intact Exudate Amount: Large Exudate Type: Serous Exudate Color: amber Wound Bed Granulation Amount: Medium (34-66%) Exposed Structure Granulation Quality: Pale, Hyper-granulation Fascia Exposed: No Necrotic Amount: Medium (34-66%)  Fat Layer Exposed: No Necrotic Quality: Adherent Slough Tendon Exposed: No Urbanowicz, Margrett L. ([id number]) Muscle Exposed: No Joint Exposed: No Bone Exposed: No Limited to Skin Breakdown Periwound Skin  Texture Texture Color No Abnormalities Noted: No No Abnormalities Noted: No Callus: No Atrophie Blanche: No Crepitus: No Cyanosis: No Excoriation: No Ecchymosis: No Fluctuance: No Erythema: No Friable: No Hemosiderin Staining: No Induration: No Mottled: No Localized Edema: No Pallor: No Rash: No Rubor: No Scarring: Yes Moisture No Abnormalities Noted: No Dry / Scaly: No Maceration: No Moist: Yes Wound Preparation Ulcer Cleansing: Rinsed/Irrigated with Saline Topical Anesthetic Applied: Other: lidocaine 4%, Electronic Signature(s) Signed: 07/01/2016 4:56:16 PM By: Elliot Gurney, RN, BSN, Kim RN, BSN Entered By: Elliot Gurney, RN, BSN, Kim on 07/01/2016 11:24:31 Sheffler, [name] ([id number]) -------------------------------------------------------------------------------- Wound Assessment Details Patient Name: [name] Date of Service: 07/01/2016 9:30 AM Medical Record Number: [id number] Patient Account Number: [id number] Date of Birth/Sex: 26-May-1961 (55 y.o. Female) Treating RN: Huel Coventry Primary Care Physician: Joen [name] Other Clinician: Referring Physician: Treating Physician/Extender: Rudene Re in Treatment: 0 Wound Status Wound Number: 2 Primary Pressure Ulcer Etiology: Wound Location: Left Gluteal fold Wound Status: Open Wounding Event: Pressure Injury Comorbid Asthma, Hypertension, Type II Date Acquired: 03/26/2016 History: Diabetes, Neuropathy Weeks Of Treatment: 0 Clustered Wound: No Photos Wound Measurements Length: (cm) 8 Width: (cm) 10 Depth: (cm) 2.4 Area: (cm) 62.832 Volume: (cm) 150.796 % Reduction in Area: 0% % Reduction in Volume: 0% Tunneling: No Undermining: No Wound Description Classification: Category/Stage III Wound Margin: Flat and Intact Exudate Amount: Large Exudate Type: Serous Exudate Color: amber Wound Bed Granulation Amount: Medium (34-66%) Exposed Structure Granulation Quality: Pink Fascia Exposed: No Necrotic  Amount: Medium (34-66%) Fat Layer Exposed: No Necrotic Quality: Adherent Slough Tendon Exposed: No Muscle Exposed: No Canal, Gae L. ([id number]) Joint Exposed: No Bone Exposed: No Limited to Skin Breakdown Periwound Skin Texture Texture Color No Abnormalities Noted: No No Abnormalities Noted: No Callus: No Atrophie Blanche: No Crepitus: No Cyanosis: No Excoriation: No Ecchymosis: No Fluctuance: No Erythema: No Friable: No Hemosiderin Staining: No Induration: No Mottled: No Localized Edema: No Pallor: No Rash: No Rubor: No Scarring: No Moisture No Abnormalities Noted: No Dry / Scaly: No Maceration: No Moist: No Wound Preparation Ulcer Cleansing: Rinsed/Irrigated with Saline Topical Anesthetic Applied: Other: LIDOCAINE 4%, Electronic Signature(s) Signed: 07/01/2016 4:56:16 PM By: Elliot Gurney, RN, BSN, Kim RN, BSN Entered By: Elliot Gurney, RN, BSN, Kim on 07/01/2016 11:25:32 Mallek, [name] ([id number]) -------------------------------------------------------------------------------- Vitals Details Patient Name: [name] Date of Service: 07/01/2016 9:30 AM Medical Record Number: [id number] Patient Account Number: [id number] Date of Birth/Sex: 14-Dec-1961 (55 y.o. Female) Treating RN: Huel Coventry Primary Care Physician: Joen [name] Other Clinician: Referring Physician: Treating Physician/Extender: Rudene Re in Treatment: 0 Vital Signs Time Taken: 09:45 Temperature (F): 98.7 Height (in): 64 Pulse (bpm): 86 Source: Stated Respiratory Rate (breaths/min): 18 Weight (lbs): 249 Blood Pressure (mmHg): 129/71 Source: Stated Reference Range: 80 - 120 mg / dl Body Mass Index (BMI): 42.7 Electronic Signature(s) Signed: 07/01/2016 4:56:16 PM By: Elliot Gurney, RN, BSN, Kim RN, BSN Entered By: Elliot Gurney, RN, BSN, Kim on 07/01/2016 09:46:32

## 2016-07-02 NOTE — Progress Notes (Signed)
[name], [name] ([id number]) Visit Report for 07/01/2016 Chief Complaint Document Details Patient Name: [name], [name] Date of Service: 07/01/2016 9:30 AM Medical Record Number: [id number] Patient Account Number: [id number] Date of Birth/Sex: 09-23-1961 (56 y.o. Female) Treating RN: Phillis Haggis Primary Care Physician: Joen [name] Other Clinician: Referring Physician: Treating Physician/Extender: Rudene Re in Treatment: 0 Information Obtained from: Patient Chief Complaint Patient is at the clinic for treatment of an open pressure ulcer 2 bilateral gluteal area for about 3 months Electronic Signature(s) Signed: 07/01/2016 11:19:44 AM By: Evlyn Kanner MD, FACS Entered By: Evlyn Kanner on 07/01/2016 11:19:43 Benison, [name] ([id number]) -------------------------------------------------------------------------------- HPI Details Patient Name: [name] Date of Service: 07/01/2016 9:30 AM Medical Record Number: [id number] Patient Account Number: [id number] Date of Birth/Sex: [birth year] (55 y.o. Female) Treating RN: Ashok Cordia, Debi Primary Care Physician: Joen [name] Other Clinician: Referring Physician: Treating Physician/Extender: Rudene Re in Treatment: 0 History of Present Illness Location: bilateral gluteal ulcerations Quality: Patient reports experiencing a dull pain to affected area(s). Severity: Patient states wound are getting worse. Duration: Patient has had the wound for < 2 weeks prior to presenting for treatment Timing: Pain in wound is Intermittent (comes and goes Context: The wound occurred when the patient had a fall and was unconscious for about 48 hours laying on the floor and she had pressure injury at that stage. Modifying Factors: Other treatment(s) tried include:as noted below she has been seen by visiting wound care physicians or nurse practitioners and details have been noted Associated Signs and Symptoms: Patient reports having difficulty  standing for long periods. HPI Description: 55 year old patient who was seen by visiting [name] wound care specialist for a wound on both her buttock and was found to have an unstageable wound on the right buttock for about 2 months. I understand that she had a fall and was laying on the floor for about 48 hours before she was found and taken to the ICU and had a long injury to her gluteal area from pressure and also had broken her right humerus. She has had a right proximal humerus fracture and has been followed up with orthopedics recently. The patient has a past medical history of type 2 diabetes mellitus, paraparesis, acute pyelonephritis, GERD, hypertension, glaucoma, chronic pain, anxiety neurosis, nicotine dependence, COPD. the patient had some debridement done and was to operative was recommended to use Silvadene dressing and offloading. She is a smoker and occasionally smokes a few cigarettes. the patient requested a second opinion for months and is here to discuss her care. Electronic Signature(s) Signed: 07/01/2016 11:19:51 AM By: Evlyn Kanner MD, FACS Previous Signature: 07/01/2016 10:35:00 AM Version By: Evlyn Kanner MD, FACS Previous Signature: 07/01/2016 10:21:12 AM Version By: Evlyn Kanner MD, FACS Previous Signature: 07/01/2016 10:08:50 AM Version By: Evlyn Kanner MD, FACS Entered By: Evlyn Kanner on 07/01/2016 11:19:50 [name], [name] ([id number]) -------------------------------------------------------------------------------- Physical Exam Details Patient Name: [name] Date of Service: 07/01/2016 9:30 AM Medical Record Number: [id number] Patient Account Number: [id number] Date of Birth/Sex: [birth year] (55 y.o. Female) Treating RN: Ashok Cordia, Debi Primary Care Physician: Joen [name] Other Clinician: Referring Physician: Treating Physician/Extender: Rudene Re in Treatment: 0 Constitutional . Pulse regular. Respirations normal and unlabored. Afebrile.  . Eyes Nonicteric. Reactive to light. Ears, Nose, Mouth, and Throat Lips, teeth, and gums WNL.Marland Kitchen Moist mucosa without lesions. Neck supple and nontender. No palpable supraclavicular or cervical adenopathy. Normal sized without goiter. Respiratory WNL. No retractions.. Cardiovascular Pedal Pulses WNL. No clubbing, cyanosis or edema.  Lymphatic No adneopathy. No adenopathy. No adenopathy. Musculoskeletal Adexa without tenderness or enlargement.. Digits and nails w/o clubbing, cyanosis, infection, petechiae, ischemia, or inflammatory conditions.. Integumentary (Hair, Skin) No suspicious lesions. No crepitus or fluctuance. No peri-wound warmth or erythema. No masses.Marland Kitchen Psychiatric Judgement and insight Intact.. No evidence of depression, anxiety, or agitation.. Notes bilateral gluteal stage III pressure injuries right worse than left and these are typically in the area of pressure points where she was laying on the ground for 48 hours. Over time these have been debrided down to the subcutaneous tissue and there is some slough at the base right more than left. No sharp debridement was attempted today. Electronic Signature(s) Signed: 07/01/2016 11:20:39 AM By: Evlyn Kanner MD, FACS Entered By: Evlyn Kanner on 07/01/2016 11:20:38 [name], [name] ([id number]) -------------------------------------------------------------------------------- Physician Orders Details Patient Name: [name] Date of Service: 07/01/2016 9:30 AM Medical Record Number: [id number] Patient Account Number: [id number] Date of Birth/Sex: 11-29-1961 (55 y.o. Female) Treating RN: Huel Coventry Primary Care Physician: Joen [name] Other Clinician: Referring Physician: Treating Physician/Extender: Rudene Re in Treatment: 0 Verbal / Phone Orders: Yes Clinician: Huel Coventry Read Back and Verified: Yes Diagnosis Coding Wound Cleansing Wound #1 Right Gluteal fold o Clean wound with Normal Saline. Wound #2 Left  Gluteal fold o Clean wound with Normal Saline. Anesthetic Wound #1 Right Gluteal fold o Topical Lidocaine 4% cream applied to wound bed prior to debridement Wound #2 Left Gluteal fold o Topical Lidocaine 4% cream applied to wound bed prior to debridement Primary Wound Dressing Wound #1 Right Gluteal fold o Santyl Ointment Wound #2 Left Gluteal fold o Santyl Ointment Secondary Dressing Wound #1 Right Gluteal fold o Boardered Foam Dressing Wound #2 Left Gluteal fold o Boardered Foam Dressing Dressing Change Frequency Wound #1 Right Gluteal fold o Change dressing every day. Wound #2 Left Gluteal fold o Change dressing every day. Follow-up Appointments [name], [name] ([id number]) Wound #1 Right Gluteal fold o Other: - as NEEDED Wound #2 Left Gluteal fold o Other: - as NEEDED Off-Loading Wound #1 Right Gluteal fold o Roho cushion for wheelchair o Turn and reposition every 2 hours o Mattress - air Wound #2 Left Gluteal fold o Roho cushion for wheelchair o Turn and reposition every 2 hours o Mattress - air Additional Orders / Instructions Wound #1 Right Gluteal fold o Stop Smoking o Increase protein intake. o Activity as tolerated o Other: - Vitamin A, C and Zinc Wound #2 Left Gluteal fold o Stop Smoking o Increase protein intake. o Activity as tolerated o Other: - Vitamin A, C and Zinc Negative Pressure Wound Therapy Wound #1 Right Gluteal fold o Wound VAC settings at 125/130 mmHg continuous pressure. Use BLACK/GREEN foam to wound cavity. Use WHITE foam to fill any tunnel/s and/or undermining. Change VAC dressing 2 X WEEK. Change canister as indicated when full. Nurse may titrate settings and frequency of dressing changes as clinically indicated. - MD suggests Santyl under wound vac to be changed 3 times weekly Wound #2 Left Gluteal fold o Wound VAC settings at 125/130 mmHg continuous pressure. Use BLACK/GREEN foam  to wound cavity. Use WHITE foam to fill any tunnel/s and/or undermining. Change VAC dressing 2 X WEEK. Change canister as indicated when full. Nurse may titrate settings and frequency of dressing changes as clinically indicated. - MD suggests Santyl under wound vac to be changed 3 times weekly Electronic Signature(s) TYLIAH, SCHLERETH ([id number]) Signed: 07/01/2016 3:36:55 PM By: Evlyn Kanner MD, FACS Signed: 07/01/2016 4:56:16  PM By: Elliot Gurney, RN, BSN, Kim RN, BSN Entered By: Elliot Gurney, RN, BSN, Kim on 07/01/2016 10:31:39 Hagarty, [name] ([id number]) -------------------------------------------------------------------------------- Problem List Details Patient Name: [name] Date of Service: 07/01/2016 9:30 AM Medical Record Number: [id number] Patient Account Number: [id number] Date of Birth/Sex: March 29, 1961 (55 y.o. 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 Female) Treating RN: Ashok Cordia, Debi Primary Care Physician:  Joen [name] Other Clinician: Referring Physician: Treating Physician/Extender: Rudene Re in Treatment: 0 Subjective Chief Complaint Information obtained from Patient Patient is at the clinic for treatment of an open pressure ulcer 2 bilateral gluteal area for about 3 months History of Present Illness (HPI) The following HPI elements were documented for the patient's wound: Location: bilateral gluteal ulcerations Quality: Patient reports experiencing a dull pain to affected area(s). Severity: Patient states wound are getting worse. Duration: Patient has had the wound for < 2 weeks prior to presenting for treatment Timing: Pain in wound is Intermittent (comes and goes Context: The wound occurred when the patient had a fall and was unconscious for about 48 hours laying on the floor and she had pressure injury at that stage. Modifying Factors: Other treatment(s) tried include:as noted below she has been seen by visiting wound care physicians or nurse practitioners and details have been noted Associated Signs and Symptoms: Patient reports having difficulty standing for long periods. 55 year old patient who was seen by visiting [name] wound care specialist for a wound on both her buttock and was found to have an unstageable wound on the right buttock for about 2 months. I understand that she had a fall and was laying on the floor for about 48 hours before she was found and taken to the ICU and had a long injury to her gluteal area from pressure and also had broken her right humerus. She has had a right proximal humerus fracture and has been followed up with orthopedics recently. The patient has a past medical history of type 2 diabetes mellitus, paraparesis, acute pyelonephritis, GERD, hypertension, glaucoma, chronic pain, anxiety neurosis, nicotine dependence, COPD. the patient had some debridement done and was to operative was recommended to use Silvadene dressing and  offloading. She is a smoker and occasionally smokes a few cigarettes. the patient requested a second opinion for months and is here to discuss her care. Wound History Patient presents with 1 open wound that has been present for approximately 6 weeks. Patient has been treating wound in the following manner: santyl. Laboratory tests have been performed in the last month. Patient reportedly has tested positive for an antibiotic resistant organism. Patient reportedly has not tested positive for osteomyelitis. Patient reportedly has not had testing performed to evaluate circulation in the legs. Patient experiences the following problems associated with their wounds: infection. Patient History  [name], [name] ([id number]) Information obtained from Patient, Caregiver. Allergies Biaxin Family History Cancer - Mother, Diabetes - Father, Hypertension - Mother, Father, Stroke - Mother, No family history of Heart Disease, Kidney Disease, Lung Disease, Seizures, Thyroid Problems. Social History Former smoker, Marital Status - Divorced, Alcohol Use - Never, Drug Use - Prior History, Caffeine Use - Rarely. Medical History Eyes Denies history of Cataracts, Glaucoma, Optic Neuritis Ear/Nose/Mouth/Throat Denies history of Chronic sinus problems/congestion, Middle ear problems Hematologic/Lymphatic Denies history of Anemia, Hemophilia, Human Immunodeficiency Virus, Lymphedema, Sickle Cell Disease Respiratory Patient has history of Asthma Denies history of Aspiration, Chronic Obstructive Pulmonary Disease (COPD), Pneumothorax, Sleep Apnea, Tuberculosis Cardiovascular Patient has history of Hypertension Denies history of Angina, Arrhythmia, Congestive Heart Failure, Coronary Artery Disease, Deep Vein Thrombosis, Hypotension, Myocardial Infarction, Peripheral Arterial Disease, Peripheral Venous Disease, Phlebitis, Vasculitis Gastrointestinal Denies history of Cirrhosis , Colitis, Crohn s, Hepatitis A,  Hepatitis B, Hepatitis C Endocrine Patient has history of Type II Diabetes Denies history of Type I Diabetes Genitourinary Denies history of End Stage Renal Disease Immunological Denies history of Lupus Erythematosus, Raynaud s, Scleroderma Integumentary (Skin) Denies history of History of Burn, History of pressure wounds Musculoskeletal Denies history of Gout, Rheumatoid Arthritis, Osteoarthritis, Osteomyelitis Neurologic Patient has history of Neuropathy Denies history of Dementia, Quadriplegia, Paraplegia, Seizure Disorder Oncologic Denies history of Received Chemotherapy, Received Radiation Psychiatric Denies history of Anorexia/bulimia, Confinement Anxiety [name], [name] ([id number]) Patient is treated with Insulin. Blood sugar is tested. Blood sugar results noted at the following times: Breakfast - 121. Hospitalization/Surgery History - 04/09/2016, [facility], [facility]. Review of Systems (ROS) Constitutional Symptoms (General Health) The patient has no complaints or symptoms. Eyes Complains or has symptoms of Glasses / Contacts. Denies complaints or symptoms of Dry Eyes, Vision Changes. Ear/Nose/Mouth/Throat The patient has no complaints or symptoms. Hematologic/Lymphatic The patient has no complaints or symptoms. Respiratory The patient has no complaints or symptoms. Cardiovascular The patient has no complaints or symptoms. Gastrointestinal The patient has no complaints or symptoms. Endocrine The patient has no complaints or symptoms. Genitourinary Denies complaints or symptoms of Kidney failure/ Dialysis. Immunological Denies complaints or symptoms of Hives, Itching. Integumentary (Skin) Complains or has symptoms of Wounds. Denies complaints or symptoms of Bleeding or bruising tendency, Breakdown, Swelling. Musculoskeletal The patient has no complaints or symptoms. Neurologic The patient has no complaints or symptoms. Oncologic The patient has no complaints or  symptoms. Psychiatric The patient has no complaints or symptoms. Medications acetaminophen 325 mg tablet oral 2 2 tablets (650 mg.) oral every six hours as needed alprazolam 0.5 mg tablet oral 1 1 tablet oral two times daily Lyrica 100 mg capsule oral 1 1 capsule oral three times daily Lipitor 20 mg tablet oral 1 1 tablet oral nightly ipratropium-albuterol 0.5 mg-3 mg(2.5 mg base)/3 mL nebulization soln inhalation 1 1 solution for nebulization inhalation every four hours metoprolol tartrate 25 mg tablet oral 1 1 tablet oral two times daily [name], [name] ([id number]) Calcium 600 + D(3) 600 mg (1,500 mg)-400 unit tablet oral 1 1 tablet oral two times daily famotidine 20 mg tablet oral 1 1 tablet oral two times daily Levemir 100 unit/mL subcutaneous solution subcutaneous solution subcutaneous inject 60 units once daily Novolog 100 unit/mL subcutaneous solution subcutaneous solution subcutaneous inject eight units three times daily ferrous sulfate 325 mg (65 mg iron) tablet oral 1 1 tablet oral two times daily bisacodyl 5 mg tablet oral 1 1 tablet oral daily Colace 100 mg capsule oral 1 1 capsule oral two  times daily furosemide 20 mg tablet oral 1 1 tablet oral daily magnesium oxide 400 mg tablet oral 1 1 tablet oral daily latanoprost 0.005 % eye drops ophthalmic 1 1 drops ophthalmic to both eyes nightly hydrocodone 5 mg-acetaminophen 325 mg tablet oral 2 2 tablet oral every six hours as needed Percocet 5 mg-325 mg tablet oral 1 1 tablet oral every four hours as needed trazodone 50 mg tablet oral 1 1 tablet oral nightly Santyl 250 unit/gram topical ointment topical ointment topical apply to wounds daily Ditropan XL 10 mg tablet,extended release oral 1 1 tablet extended release 24hr oral nightly Vitamin C 500 mg tablet oral 1 1 tablet oral two times daily Objective Constitutional Pulse regular. Respirations normal and unlabored. Afebrile. Vitals Time Taken: 9:45 AM, Height: 64 in, Source:  Stated, Weight: 249 lbs, Source: Stated, BMI: 42.7, Temperature: 98.7 F, Pulse: 86 bpm, Respiratory Rate: 18 breaths/min, Blood Pressure: 129/71 mmHg. Eyes Nonicteric. Reactive to light. Ears, Nose, Mouth, and Throat Lips, teeth, and gums WNL.Marland Kitchen Moist mucosa without lesions. Neck supple and nontender. No palpable supraclavicular or cervical adenopathy. Normal sized without goiter. Respiratory WNL. No retractions.. Cardiovascular Pedal Pulses WNL. No clubbing, cyanosis or edema. Lymphatic No adneopathy. No adenopathy. No adenopathy. Musculoskeletal Valera, Lovell L. ([id number]) Adexa without tenderness or enlargement.. Digits and nails w/o clubbing, cyanosis, infection, petechiae, ischemia, or inflammatory conditions.Marland Kitchen Psychiatric Judgement and insight Intact.. No evidence of depression, anxiety, or agitation.. General Notes: bilateral gluteal stage III pressure injuries right worse than left and these are typically in the area of pressure points where she was laying on the ground for 48 hours. Over time these have been debrided down to the subcutaneous tissue and there is some slough at the base right more than left. No sharp debridement was attempted today. Integumentary (Hair, Skin) No suspicious lesions. No crepitus or fluctuance. No peri-wound warmth or erythema. No masses.. Wound #1 status is Open. Original cause of wound was Pressure Injury. The wound is located on the Right Gluteal fold. The wound measures 7.8cm length x 4.2cm width x 1.5cm depth; 25.73cm^2 area and 38.594cm^3 volume. The wound is limited to skin breakdown. There is tunneling at 2:00 with a maximum distance of 1.8cm. There is a large amount of serous drainage noted. The wound margin is flat and intact. There is medium (34-66%) pale granulation within the wound bed. There is a medium (34-66%) amount of necrotic tissue within the wound bed including Adherent Slough. The periwound skin appearance  exhibited: Scarring, Moist. The periwound skin appearance did not exhibit: Callus, Crepitus, Excoriation, Fluctuance, Friable, Induration, Localized Edema, Rash, Dry/Scaly, Maceration, Atrophie Blanche, Cyanosis, Ecchymosis, Hemosiderin Staining, Mottled, Pallor, Rubor, Erythema. Wound #2 status is Open. Original cause of wound was Pressure Injury. The wound is located on the Left Gluteal fold. The wound measures 8cm length x 10cm width x 2.4cm depth; 62.832cm^2 area and 150.796cm^3 volume. The wound is limited to skin breakdown. There is no tunneling or undermining noted. There is a large amount of serous drainage noted. The wound margin is flat and intact. There is medium (34-66%) pink granulation within the wound bed. There is a medium (34-66%) amount of necrotic tissue within the wound bed including Adherent Slough. The periwound skin appearance did not exhibit: Callus, Crepitus, Excoriation, Fluctuance, Friable, Induration, Localized Edema, Rash, Scarring, Dry/Scaly, Maceration, Moist, Atrophie Blanche, Cyanosis, Ecchymosis, Hemosiderin Staining, Mottled, Pallor, Rubor, Erythema. Assessment Active Problems ICD-10 E11.622 - Type 2 diabetes mellitus with other skin ulcer L89.313 - Pressure ulcer of right  buttock, stage 3 L89.323 - Pressure ulcer of left buttock, stage 3 G82.22 - Paraplegia, incomplete [name], [name] ([id number]) this 55 year old patient who has paraparesis and had a prolonged episode of unconsciousness laying on the floor developed full thickness unstageable pressure injuries to both buttocks which have been treated over time with debridement and now has stage III pressure ulcers. He is also a diabetic and has multiple urinary tract infections and other comorbidities and has been treated appropriately for this. Easier for a second opinion regarding her wound care and I have recommended: 1. Though it's going to be difficult her best options are to use Santyl in the wound  base and then have black foam applied and wound vacs to both lower gluteal wounds. 2. Stressing should be changed 3 times a week and appropriate debridement from time to time would be recommended. 3. Good control of her diabetes mellitus 4. Appropriate surfaces for her bed and wheelchair 5. Adequate protein intake, vitamin A, vitamin C and zinc If she so desires we can see her back but if not she can continue with the wound care service providing her care at her nursing home. The patient has had all questions answered and is pleased with the opinion. Plan Wound Cleansing: Wound #1 Right Gluteal fold: Clean wound with Normal Saline. Wound #2 Left Gluteal fold: Clean wound with Normal Saline. Anesthetic: Wound #1 Right Gluteal fold: Topical Lidocaine 4% cream applied to wound bed prior to debridement Wound #2 Left Gluteal fold: Topical Lidocaine 4% cream applied to wound bed prior to debridement Primary Wound Dressing: Wound #1 Right Gluteal fold: Santyl Ointment Wound #2 Left Gluteal fold: Santyl Ointment Secondary Dressing: Wound #1 Right Gluteal fold: Boardered Foam Dressing Wound #2 Left Gluteal fold: Boardered Foam Dressing Dressing Change Frequency: Wound #1 Right Gluteal fold: Change dressing every day. Wound #2 Left Gluteal fold: Change dressing every day. Follow-up Appointments: [name], [name] Wound #1 Right Gluteal fold: Other: - as NEEDED Wound #2 Left Gluteal fold: Other: - as NEEDED Off-Loading: Wound #1 Right Gluteal fold: Roho cushion for wheelchair Turn and reposition every 2 hours Mattress - air Wound #2 Left Gluteal fold: Roho cushion for wheelchair Turn and reposition every 2 hours Mattress - air Additional Orders / Instructions: Wound #1 Right Gluteal fold: Stop Smoking Increase protein intake. Activity as tolerated Other: - Vitamin A, C and Zinc Wound #2 Left Gluteal fold: Stop Smoking Increase protein intake. Activity as  tolerated Other: - Vitamin A, C and Zinc Negative Pressure Wound Therapy: Wound #1 Right Gluteal fold: Wound VAC settings at 125/130 mmHg continuous pressure. Use BLACK/GREEN foam to wound cavity. Use WHITE foam to fill any tunnel/s and/or undermining. Change VAC dressing 2 X WEEK. Change canister as indicated when full. Nurse may titrate settings and frequency of dressing changes as clinically indicated. - MD suggests Santyl under wound vac to be changed 3 times weekly Wound #2 Left Gluteal fold: Wound VAC settings at 125/130 mmHg continuous pressure. Use BLACK/GREEN foam to wound cavity. Use WHITE foam to fill any tunnel/s and/or undermining. Change VAC dressing 2 X WEEK. Change canister as indicated when full. Nurse may titrate settings and frequency of dressing changes as clinically indicated. - MD suggests Santyl under wound vac to be changed 3 times weekly this 55 year old patient who has paraparesis and had a prolonged episode of unconsciousness laying on the floor developed full thickness unstageable pressure injuries to both buttocks which have been treated over time with debridement and now  has stage III pressure ulcers. He is also a diabetic and has multiple urinary tract infections and other comorbidities and has been treated appropriately for this. Easier for a second opinion regarding her wound care and I have recommended: 1. Though it's going to be difficult her best options are to use Santyl in the wound base and then have black foam applied and wound vacs to both lower gluteal wounds. [name], [name] ([id number]) 2. Stressing should be changed 3 times a week and appropriate debridement from time to time would be recommended. 3. Good control of her diabetes mellitus 4. Appropriate surfaces for her bed and wheelchair 5. Adequate protein intake, vitamin A, vitamin C and zinc If she so desires we can see her back but if not she can continue with the wound care service providing  her care at her nursing home. The patient has had all questions answered and is pleased with the opinion. Electronic Signature(s) Signed: 07/01/2016 3:34:41 PM By: Evlyn Kanner MD, FACS Previous Signature: 07/01/2016 11:23:14 AM Version By: Evlyn Kanner MD, FACS Entered By: Evlyn Kanner on 07/01/2016 15:34:41 [name], [name] ([id number]) -------------------------------------------------------------------------------- ROS/PFSH Details Patient Name: [name] Date of Service: 07/01/2016 9:30 AM Medical Record Number: [id number] Patient Account Number: [id number] Date of Birth/Sex: [birth year] (55 y.o. Female) Treating RN: Huel Coventry Primary Care Physician: Joen [name] Other Clinician: Referring Physician: Treating Physician/Extender: Rudene Re in Treatment: 0 Information Obtained From Patient Caregiver Wound History Do you currently have one or more open woundso Yes How many open wounds do you currently haveo 1 Approximately how long have you had your woundso 6 weeks How have you been treating your wound(s) until nowo santyl Has your wound(s) ever healed and then re-openedo No Have you had any lab work done in the past montho Yes Have you tested positive for osteomyelitis (bone infection)o No Have you had any tests for circulation on your legso No Have you had other problems associated with your woundso Infection Eyes Complaints and Symptoms: Positive for: Glasses / Contacts Negative for: Dry Eyes; Vision Changes Medical History: Negative for: Cataracts; Glaucoma; Optic Neuritis Genitourinary Complaints and Symptoms: Negative for: Kidney failure/ Dialysis Medical History: Negative for: End Stage Renal Disease Immunological Complaints and Symptoms: Negative for: Hives; Itching Medical History: Negative for: Lupus Erythematosus; Raynaudos; Scleroderma Integumentary (Skin) Complaints and Symptoms: Positive for: Wounds Atiyeh, Liboria L. ([id number]) Negative for:  Bleeding or bruising tendency; Breakdown; Swelling Medical History: Negative for: History of Burn; History of pressure wounds Constitutional Symptoms (General Health) Complaints and Symptoms: No Complaints or Symptoms Ear/Nose/Mouth/Throat Complaints and Symptoms: No Complaints or Symptoms Medical History: Negative for: Chronic sinus problems/congestion; Middle ear problems Hematologic/Lymphatic Complaints and Symptoms: No Complaints or Symptoms Medical History: Negative for: Anemia; Hemophilia; Human Immunodeficiency Virus; Lymphedema; Sickle Cell Disease Respiratory Complaints and Symptoms: No Complaints or Symptoms Medical History: Positive for: Asthma Negative for: Aspiration; Chronic Obstructive Pulmonary Disease (COPD); Pneumothorax; Sleep Apnea; Tuberculosis Cardiovascular Complaints and Symptoms: No Complaints or Symptoms Medical History: Positive for: Hypertension Negative for: Angina; Arrhythmia; Congestive Heart Failure; Coronary Artery Disease; Deep Vein Thrombosis; Hypotension; Myocardial Infarction; Peripheral Arterial Disease; Peripheral Venous Disease; Phlebitis; Vasculitis Gastrointestinal Complaints and Symptoms: No Complaints or Symptoms Boyington, Daiya L. ([id number]) Medical History: Negative for: Cirrhosis ; Colitis; Crohnos; Hepatitis A; Hepatitis B; Hepatitis C Endocrine Complaints and Symptoms: No Complaints or Symptoms Medical History: Positive for: Type II Diabetes Negative for: Type I Diabetes Time with diabetes: 30 yrs Treated with: Insulin Blood sugar tested every day: Yes  Tested : Blood sugar testing results: Breakfast: 121 Musculoskeletal Complaints and Symptoms: No Complaints or Symptoms Medical History: Negative for: Gout; Rheumatoid Arthritis; Osteoarthritis; Osteomyelitis Neurologic Complaints and Symptoms: No Complaints or Symptoms Medical History: Positive for: Neuropathy Negative for: Dementia; Quadriplegia; Paraplegia;  Seizure Disorder Oncologic Complaints and Symptoms: No Complaints or Symptoms Medical History: Negative for: Received Chemotherapy; Received Radiation Psychiatric Complaints and Symptoms: No Complaints or Symptoms Medical History: Negative for: Anorexia/bulimia; Confinement Anxiety Kampe, Marizol L. ([id number]) Hospitalization / Surgery History Name of Hospital Purpose of Hospitalization/Surgery Date [facility] AMS 04/09/2016 Family and Social History Cancer: Yes - Mother; Diabetes: Yes - Father; Heart Disease: No; Hypertension: Yes - Mother, Father; Kidney Disease: No; Lung Disease: No; Seizures: No; Stroke: Yes - Mother; Thyroid Problems: No; Former smoker; Marital Status - Divorced; Alcohol Use: Never; Drug Use: Prior History; Caffeine Use: Rarely; Advanced Directives: No; Patient does not want information on Advanced Directives; Living Will: No; Medical Power of Attorney: No Physician Affirmation I have reviewed and agree with the above information. Electronic Signature(s) Signed: 07/01/2016 3:36:55 PM By: Evlyn Kanner MD, FACS Signed: 07/01/2016 4:56:16 PM By: Elliot Gurney RN, BSN, Kim RN, BSN Entered By: Evlyn Kanner on 07/01/2016 10:02:12 Espaillat, [name] -------------------------------------------------------------------------------- SuperBill Details Patient Name: [name] Date of Service: 07/01/2016 Medical Record Number: [id number] Patient Account Number: [id number] Date of Birth/Sex: May 16, 1961 (55 y.o. Female) Treating RN: Phillis Haggis Primary Care Physician: Joen [name] Other Clinician: Referring Physician: Treating Physician/Extender: Rudene Re in Treatment: 0 Diagnosis Coding ICD-10 Codes Code Description E11.622 Type 2 diabetes mellitus with other skin ulcer L89.313 Pressure ulcer of right buttock, stage 3 L89.323 Pressure ulcer of left buttock, stage 3 G82.22 Paraplegia, incomplete Facility Procedures CPT4 Code: 08657846 Description: 99213 -  WOUND CARE VISIT-LEV 3 EST PT Modifier: Quantity: 1 Physician Procedures CPT4 Code: 9629528 Description: 99204 - WC PHYS LEVEL 4 - NEW PT ICD-10 Description Diagnosis E11.622 Type 2 diabetes mellitus with other skin ulcer L89.313 Pressure ulcer of right buttock, stage 3 L89.323 Pressure ulcer of left buttock, stage 3 G82.22 Paraplegia,  incomplete Modifier: Quantity: 1 Electronic Signature(s) Signed: 07/01/2016 11:23:34 AM By: Evlyn Kanner MD, FACS Entered By: Evlyn Kanner on 07/01/2016 11:23:33

## 2016-07-08 ENCOUNTER — Other Ambulatory Visit: Payer: Self-pay | Admitting: Neurology

## 2016-07-08 DIAGNOSIS — R29898 Other symptoms and signs involving the musculoskeletal system: Secondary | ICD-10-CM

## 2016-07-19 ENCOUNTER — Other Ambulatory Visit
Admission: RE | Admit: 2016-07-19 | Discharge: 2016-07-19 | Disposition: A | Discharge status: Home / Self Care | Payer: Managed Care, Other (non HMO) | Source: Other Acute Inpatient Hospital | Attending: Neurology | Admitting: Neurology

## 2016-07-19 DIAGNOSIS — R29898 Other symptoms and signs involving the musculoskeletal system: Secondary | ICD-10-CM | POA: Insufficient documentation

## 2016-07-20 LAB — HEAVY METALS PROFILE, URINE
Arsenic (Total),U: NOT DETECTED ug/L (ref 0–50)
Arsenic(Inorganic),U: NOT DETECTED ug/L (ref 0–19)
Creatinine(Crt),U: 0.23 g/L — ABNORMAL LOW (ref 0.30–3.00)
Lead, Rand Ur: NOT DETECTED ug/L (ref 0–49)
Mercury, Ur: NOT DETECTED ug/L (ref 0–19)
Total Volume: 24

## 2016-07-22 ENCOUNTER — Ambulatory Visit: Admission: RE | Admit: 2016-07-22 | Payer: BLUE CROSS/BLUE SHIELD | Source: Ambulatory Visit

## 2016-08-01 ENCOUNTER — Ambulatory Visit
Admission: RE | Admit: 2016-08-01 | Discharge: 2016-08-01 | Disposition: A | Discharge status: Home / Self Care | Payer: Managed Care, Other (non HMO) | Source: Ambulatory Visit | Attending: Urology | Admitting: Urology

## 2016-08-01 DIAGNOSIS — N390 Urinary tract infection, site not specified: Secondary | ICD-10-CM | POA: Insufficient documentation

## 2016-08-01 DIAGNOSIS — I700 Atherosclerosis of aorta: Secondary | ICD-10-CM | POA: Diagnosis not present

## 2016-08-01 DIAGNOSIS — I313 Pericardial effusion (noninflammatory): Secondary | ICD-10-CM | POA: Diagnosis not present

## 2016-08-01 MED ORDER — IOPAMIDOL (ISOVUE-370) INJECTION 76%
100.0000 mL | Freq: Once | INTRAVENOUS | Status: AC | PRN
  Administered 2016-08-01: 100 mL via INTRAVENOUS

## 2016-08-05 ENCOUNTER — Ambulatory Visit (INDEPENDENT_AMBULATORY_CARE_PROVIDER_SITE_OTHER): Payer: Managed Care, Other (non HMO) | Admitting: Urology

## 2016-08-05 ENCOUNTER — Encounter: Payer: Self-pay | Admitting: Urology

## 2016-08-05 VITALS — BP 96/62 | HR 73

## 2016-08-05 DIAGNOSIS — N2889 Other specified disorders of kidney and ureter: Secondary | ICD-10-CM | POA: Diagnosis not present

## 2016-08-05 DIAGNOSIS — N390 Urinary tract infection, site not specified: Secondary | ICD-10-CM | POA: Diagnosis not present

## 2016-08-05 NOTE — Progress Notes (Signed)
08/05/2016 9:16 AM   [name] 1961-08-21 [id number]  Referring provider: Dortha Kern, MD [address] [place], [place] [address]  Chief Complaint  Patient presents with  . Follow-up    CT results    HPI: 55 year old female referred by Dr. Sampson Goon for further evaluation of possible renal mass.  She was recently admitted to the hospital with altered mental status, lower extremity weakness, and DKA in 04/2016. She was found to have urosepsis with Klebsiella and E. coli, acute renal failure, rhabdomyolysis in addition to the above.  Part of further workup for her extremity weakness, she underwent an MRI without contrast on 04/25/2016 which showed enlarged left paraaortic lymph node as well as irregular cystic lesions within her kidneys. Due to lack of contrast, these are not completely characterize nor was the study protocol to assess these lesions.  She underwent further workup with a CT abdomen pelvis with contrast for these renal lesions which revealed simple cyst in the left kidney along with 2 other ill-defined lesions, measuring 4.1 cm in the left anterior upper pole and 2.8 cm in the left interpolar region along with a 2.5 cm lesion in the left lower posterior pole.  He is also noted to have mild left bladder wall thickening consistent with her known history of cystitis.    No further fevers.  No flank pain or gross hematuria.  Adequately treated with vancomycin and Zosyn.  She returns today after follow-up CT scan which was complete resolution of her renal lesion.  She reports that she has numerous asymptomatic UTIs other than AMS.     PMHx significant for DM (poorly controlled), recent left LE cellulitis, paraplegia of unclear etiology.   PMH: Past Medical History:  Diagnosis Date  . Anxiety   . Arthritis   . Asthma   . Diabetes mellitus without complication (HCC)   . GERD (gastroesophageal reflux disease)   . Glaucoma   . Hyperlipemia   . Hyperlipemia   .  Hypertension     Surgical History: Past Surgical History:  Procedure Laterality Date  . ANTERIOR CRUCIATE LIGAMENT REPAIR      Home Medications:    Medication List       Accurate as of 08/05/16  9:16 AM. Always use your most recent med list.          acetaminophen 325 MG tablet Commonly known as:  TYLENOL Take 2 tablets (650 mg total) by mouth every 6 (six) hours as needed for mild pain (or Fever >/= 101).   ALPRAZolam 0.5 MG tablet Commonly known as:  XANAX Take 1 tablet (0.5 mg total) by mouth 2 (two) times daily as needed for anxiety or sleep.   atorvastatin 20 MG tablet Commonly known as:  LIPITOR Take 20 mg by mouth at bedtime.   bisacodyl 5 MG EC tablet Commonly known as:  DULCOLAX Take 1 tablet (5 mg total) by mouth daily as needed for moderate constipation.   docusate sodium 100 MG capsule Commonly known as:  COLACE Take 1 capsule (100 mg total) by mouth 2 (two) times daily.   famotidine 20 MG tablet Commonly known as:  PEPCID Take 1 tablet (20 mg total) by mouth 2 (two) times daily.   ferrous sulfate 325 (65 FE) MG tablet Take by mouth.   furosemide 20 MG tablet Commonly known as:  LASIX Take 1 tablet (20 mg total) by mouth daily.   HYDROcodone-acetaminophen 5-325 MG tablet Commonly known as:  NORCO/VICODIN Take 1-2 tablets by  mouth every 4 (four) hours as needed for moderate pain.   insulin aspart 100 UNIT/ML injection Commonly known as:  novoLOG Inject 6 Units into the skin 3 (three) times daily with meals.   insulin glargine 100 UNIT/ML injection Commonly known as:  LANTUS Inject 0.42 mLs (42 Units total) into the skin daily.   ipratropium-albuterol 0.5-2.5 (3) MG/3ML Soln Commonly known as:  DUONEB Take 3 mLs by nebulization 4 (four) times daily as needed. For wheezing.   latanoprost 0.005 % ophthalmic solution Commonly known as:  XALATAN Place 1 drop into both eyes at bedtime.   magnesium oxide 400 MG tablet Commonly known as:   MAG-OX Take by mouth.   metoprolol tartrate 25 MG tablet Commonly known as:  LOPRESSOR Take 1 tablet (25 mg total) by mouth 2 (two) times daily.   oxybutynin 10 MG 24 hr tablet Commonly known as:  DITROPAN-XL Take 10 mg by mouth at bedtime.   oxyCODONE-acetaminophen 5-325 MG tablet Commonly known as:  PERCOCET/ROXICET Take by mouth.   pregabalin 200 MG capsule Commonly known as:  LYRICA Take 200 mg by mouth 2 (two) times daily.   traZODone 50 MG tablet Commonly known as:  DESYREL Take 1 tablet (50 mg total) by mouth at bedtime.       Allergies:  Allergies  Allergen Reactions  . Biaxin [Clarithromycin] Rash    Patient states this medication gives her severe rash and thrush in the mouth.    Family History: Family History  Problem Relation Age of Onset  . Kidney cancer Neg Hx   . Prostate cancer Neg Hx     Social History:  reports that she has been smoking.  She has never used smokeless tobacco. She reports that she does not drink alcohol. Her drug history is not on file.  ROS: UROLOGY Frequent Urination?: No Hard to postpone urination?: No Burning/pain with urination?: No Get up at night to urinate?: Yes Leakage of urine?: No Urine stream starts and stops?: No Trouble starting stream?: No Do you have to strain to urinate?: No Blood in urine?: No Urinary tract infection?: No Sexually transmitted disease?: No Injury to kidneys or bladder?: No Painful intercourse?: No Weak stream?: No Currently pregnant?: No Vaginal bleeding?: No Last menstrual period?: n  Gastrointestinal Nausea?: No Vomiting?: No Indigestion/heartburn?: No Diarrhea?: No Constipation?: No  Constitutional Fever: No Night sweats?: No Weight loss?: No Fatigue?: No  Skin Skin rash/lesions?: No Itching?: No  Eyes Blurred vision?: No Double vision?: No  Ears/Nose/Throat Sore throat?: No Sinus problems?: No  Hematologic/Lymphatic Swollen glands?: No Easy bruising?:  No  Cardiovascular Leg swelling?: Yes Chest pain?: No  Respiratory Cough?: No Shortness of breath?: No  Endocrine Excessive thirst?: No  Musculoskeletal Back pain?: No Joint pain?: No  Neurological Headaches?: No Dizziness?: No  Psychologic Depression?: No Anxiety?: No  Physical Exam: BP 96/62 (BP Location: Right Arm, Patient Position: Sitting, Cuff Size: Large)   Pulse 73   Constitutional:  Alert and oriented, No acute distress.  In wheel chair. HEENT: [place] AT, moist mucus membranes.  Trachea midline, no masses. Respiratory: Normal respiratory effort, no increased work of breathing. Skin: No rashes, bruises or suspicious lesions. Neurologic: Grossly intact.  Nonambulatory due to LE weakness. Psychiatric: Normal mood and affect.  Laboratory Data: Lab Results  Component Value Date   WBC 10.6 05/02/2016   HGB 8.7 (L) 05/02/2016   HCT 26.1 (L) 05/02/2016   MCV 82.5 05/02/2016   PLT 317 05/02/2016    Lab Results  Component Value Date   CREATININE 0.70 06/23/2016    Lab Results  Component Value Date   HGBA1C 11.1 (H) 04/24/2016    Pertinent Imaging: CLINICAL DATA:  Recurrent urinary tract infections.  EXAM: CT ABDOMEN WITHOUT AND WITH CONTRAST  TECHNIQUE: Multidetector CT imaging of the abdomen was performed following the standard protocol before and following the bolus administration of intravenous contrast.  CONTRAST:  100 cc Isovue 370.  COMPARISON:  04/27/2016.  FINDINGS: Lower chest: Lung bases show no acute findings. Heart is at the upper limits normal in size to mildly enlarged. Mitral annulus calcification. Mild to moderate pericardial effusion, similar.  Hepatobiliary: Liver and gallbladder are unremarkable. No biliary ductal dilatation.  Pancreas: Negative.  Spleen: Negative.  Adrenals/Urinary Tract: Adrenal glands are unremarkable. No urinary stones. Low-attenuation lesions in the left kidney measure up to 2.6 cm and  show no enhancement, consistent with cysts. Previously seen area of heterogeneous, almost masslike, attenuation in the upper/mid left kidney, has nearly completely resolved in the interval, likely with associated parenchymal scarring/thinning. Visualized portions of the ureters are decompressed.  Stomach/Bowel: Stomach and visualized portions of the small bowel, appendix and colon are unremarkable.  Vascular/Lymphatic: Atherosclerotic calcification of the arterial vasculature without abdominal aortic aneurysm. Retroperitoneal lymph nodes measure up to 11 mm in the left periaortic station, stable. Abdominal peritoneal ligament lymph nodes are not enlarged by CT size criteria.  Other: No free fluid.  Mesenteries and peritoneum are unremarkable.  Musculoskeletal: No worrisome lytic or sclerotic lesions.  IMPRESSION: 1. Previously seen heterogeneous, almost masslike area of attenuation in the left kidney has nearly completely resolved in the interval, favoring an infectious process on the prior exam. 2. Mild to moderate pericardial effusion, stable. 3. Aortic atherosclerosis.   Electronically Signed   By: Leanna Battles M.D.   On: 08/01/2016 11:44   CT scan personally reviewed today and with the patient  Assessment & Plan:    1. Left renal mass Multiple ill-defined left renal masses in the setting of fevers, positive urine culture growing Klebsiella and ESBL Escherichia coli.  Previous CT scan from 04/2016 not specifically protocol to assess renal masses.  Follow-up repeat CT abdomen with and without contrast reveals a near resolution of her lesion consistent with infectious process.  - Urinalysis, Complete - CT Abd Wo & W Cm; Future  2. Recurrent UTI History of "recurrent" UTIs which manifests with altered mental status and no other urinary symptoms. Given her habitus, I suspect that she is unable to provide a clean catch specimen.  She has been treated on  innumerable occasions which is lead to multidrug resistant infections.  She followed by infectious disease, Dr. Sampson Goon.  Reiterated that moving forward, I would only like her to have catheterized specimens with urine cultures prior to any treatment or to assess for true infections. In addition, these must be symptomatic.  Return if symptoms worsen or fail to improve.  Vanna Scotland, MD  [facility] [address] [place], [place] [address] [phone]

## 2016-08-31 ENCOUNTER — Ambulatory Visit: Payer: BLUE CROSS/BLUE SHIELD

## 2016-09-07 ENCOUNTER — Ambulatory Visit: Payer: BLUE CROSS/BLUE SHIELD

## 2016-09-09 ENCOUNTER — Ambulatory Visit
Admission: RE | Admit: 2016-09-09 | Discharge: 2016-09-09 | Disposition: A | Discharge status: Home / Self Care | Payer: Managed Care, Other (non HMO) | Source: Ambulatory Visit | Attending: Neurology | Admitting: Neurology

## 2016-09-09 DIAGNOSIS — I63532 Cerebral infarction due to unspecified occlusion or stenosis of left posterior cerebral artery: Secondary | ICD-10-CM | POA: Insufficient documentation

## 2016-09-09 DIAGNOSIS — R29898 Other symptoms and signs involving the musculoskeletal system: Secondary | ICD-10-CM

## 2016-09-09 LAB — POCT I-STAT CREATININE: CREATININE: 0.9 mg/dL (ref 0.44–1.00)

## 2016-09-09 MED ORDER — GADOBENATE DIMEGLUMINE 529 MG/ML IV SOLN
20.0000 mL | Freq: Once | INTRAVENOUS | Status: AC | PRN
  Administered 2016-09-09: 20 mL via INTRAVENOUS

## 2016-09-21 ENCOUNTER — Encounter: Payer: Managed Care, Other (non HMO) | Attending: Internal Medicine | Admitting: Internal Medicine

## 2016-09-21 ENCOUNTER — Other Ambulatory Visit
Admission: RE | Admit: 2016-09-21 | Discharge: 2016-09-21 | Disposition: A | Discharge status: Home / Self Care | Payer: Managed Care, Other (non HMO) | Source: Ambulatory Visit | Attending: Internal Medicine | Admitting: Internal Medicine

## 2016-09-21 DIAGNOSIS — I10 Essential (primary) hypertension: Secondary | ICD-10-CM | POA: Insufficient documentation

## 2016-09-21 DIAGNOSIS — G8220 Paraplegia, unspecified: Secondary | ICD-10-CM | POA: Diagnosis not present

## 2016-09-21 DIAGNOSIS — K219 Gastro-esophageal reflux disease without esophagitis: Secondary | ICD-10-CM | POA: Diagnosis not present

## 2016-09-21 DIAGNOSIS — E1142 Type 2 diabetes mellitus with diabetic polyneuropathy: Secondary | ICD-10-CM | POA: Insufficient documentation

## 2016-09-21 DIAGNOSIS — G8929 Other chronic pain: Secondary | ICD-10-CM | POA: Insufficient documentation

## 2016-09-21 DIAGNOSIS — L89314 Pressure ulcer of right buttock, stage 4: Secondary | ICD-10-CM | POA: Insufficient documentation

## 2016-09-21 DIAGNOSIS — L89323 Pressure ulcer of left buttock, stage 3: Secondary | ICD-10-CM | POA: Insufficient documentation

## 2016-09-21 DIAGNOSIS — J449 Chronic obstructive pulmonary disease, unspecified: Secondary | ICD-10-CM | POA: Insufficient documentation

## 2016-09-21 DIAGNOSIS — F411 Generalized anxiety disorder: Secondary | ICD-10-CM | POA: Insufficient documentation

## 2016-09-21 DIAGNOSIS — F17210 Nicotine dependence, cigarettes, uncomplicated: Secondary | ICD-10-CM | POA: Diagnosis not present

## 2016-09-21 DIAGNOSIS — L97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 severity: Secondary | ICD-10-CM | POA: Diagnosis present

## 2016-09-21 DIAGNOSIS — H409 Unspecified glaucoma: Secondary | ICD-10-CM | POA: Diagnosis not present

## 2016-09-22 NOTE — Progress Notes (Signed)
TERYL, GUBLER ([id number]) Visit Report for 09/21/2016 Abuse/Suicide Risk Screen Details Patient Name: [name], [name] Date of Service: 09/21/2016 9:30 AM Medical Record Patient Account Number: [id number] Number: Treating RN: Phillis Haggis 02/05/1961 (55 y.o. Other Clinician: Date of Birth/Sex: Female) Treating ROBSON, MICHAEL Primary Care Physician/Extender: [name], [name] Physician: Referring Physician: Tedra Senegal in Treatment: 0 Abuse/Suicide Risk Screen Items Answer ABUSE/SUICIDE RISK SCREEN: Has anyone close to you tried to hurt or harm you recentlyo No Do you feel uncomfortable with anyone in your familyo No Has anyone forced you do things that you didnot want to doo No Do you have any thoughts of harming yourselfo No Patient displays signs or symptoms of abuse and/or neglect. No Electronic Signature(s) Signed: 09/21/2016 5:55:37 PM By: Alejandro Mulling Entered By: Alejandro Mulling on 09/21/2016 09:51:05 Beehler, [name] ([id number]) -------------------------------------------------------------------------------- Activities of Daily Living Details Patient Name: [name] Date of Service: 09/21/2016 9:30 AM Medical Record Patient Account Number: [id number] Number: Treating RN: Phillis Haggis [birth year] (55 y.o. Other Clinician: Date of Birth/Sex: Female) Treating ROBSON, MICHAEL Primary Care Physician/Extender: [name], [name] Physician: Referring Physician: Tedra Senegal in Treatment: 0 Activities of Daily Living Items Answer Activities of Daily Living (Please select one for each item) Drive Automobile Not Able Take Medications Completely Able Use Telephone Completely Able Care for Appearance Completely Able Use Toilet Completely Able Bath / Shower Need Assistance Dress Self Need Assistance Feed Self Completely Able Walk Need Assistance Get In / Out Bed Need Assistance Housework Not Able Prepare Meals Not Able Handle Money  Need Assistance Shop for Self Need Assistance Electronic Signature(s) Signed: 09/21/2016 5:55:37 PM By: Alejandro Mulling Entered By: Alejandro Mulling on 09/21/2016 09:51:08 Crail, [name] ([id number]) -------------------------------------------------------------------------------- Education Assessment Details Patient Name: [name] Date of Service: 09/21/2016 9:30 AM Medical Record Patient Account Number: [id number] Number: Treating RN: Phillis Haggis March 31, 1961 (55 y.o. Other Clinician: Date of Birth/Sex: Female) Treating ROBSON, MICHAEL Primary Care Physician/Extender: [name], [name] Physician: Referring Physician: Tedra Senegal in Treatment: 0 Primary Learner Assessed: Patient Learning Preferences/Education Level/Primary Language Learning Preference: Explanation, Printed Material Highest Education Level: High School Preferred Language: English Cognitive Barrier Assessment/Beliefs Language Barrier: No Translator Needed: No Memory Deficit: No Emotional Barrier: No Cultural/Religious Beliefs Affecting Medical No Care: Physical Barrier Assessment Impaired Vision: No Impaired Hearing: No Decreased Hand dexterity: No Knowledge/Comprehension Assessment Knowledge Level: High Comprehension Level: High Ability to understand written High instructions: Ability to understand verbal High instructions: Motivation Assessment Anxiety Level: Calm Cooperation: Cooperative Education Importance: Acknowledges Need Interest in Health Problems: Asks Questions Perception: Coherent Willingness to Engage in Self- High Management Activities: High Takayama, Malasha L. ([id number]) Readiness to Engage in Self- Management Activities: Electronic Signature(s) Signed: 09/21/2016 5:55:37 PM By: Alejandro Mulling Entered By: Alejandro Mulling on 09/21/2016 09:51:16 [place], [name] ([id number]) -------------------------------------------------------------------------------- Fall  Risk Assessment Details Patient Name: [name] Date of Service: 09/21/2016 9:30 AM Medical Record Patient Account Number: [id number] Number: Treating RN: Phillis Haggis [birth year] (55 y.o. Other Clinician: Date of Birth/Sex: Female) Treating ROBSON, MICHAEL Primary Care Physician/Extender: [name], [name] Physician: Referring Physician: Tedra Senegal in Treatment: 0 Fall Risk Assessment Items Have you had 2 or more falls in the last 12 monthso 0 No Have you had any fall that resulted in injury in the last 12 monthso 0 Yes FALL RISK ASSESSMENT: History of falling - immediate or within 3 months 25 Yes Secondary diagnosis 15 Yes Ambulatory aid None/bed rest/wheelchair/nurse 0 Yes  Crutches/cane/walker 15 Yes Furniture 0 No IV Access/Saline Lock 0 No Gait/Training Normal/bed rest/immobile 0 No Weak 0 No Impaired 0 No Mental Status Oriented to own ability 0 Yes Electronic Signature(s) Signed: 09/21/2016 5:55:37 PM By: Alejandro Mulling Entered By: Alejandro Mulling on 09/21/2016 09:52:18 [place], [name] ([id number]) -------------------------------------------------------------------------------- Foot Assessment Details Patient Name: [name] Date of Service: 09/21/2016 9:30 AM Medical Record Patient Account Number: [id number] Number: Treating RN: Phillis Haggis 1961-10-10 (55 y.o. Other Clinician: Date of Birth/Sex: Female) Treating ROBSON, MICHAEL Primary Care Physician/Extender: [name], [name] Physician: Referring Physician: Tedra Senegal in Treatment: 0 Foot Assessment Items Site Locations + = Sensation present, - = Sensation absent, C = Callus, U = Ulcer R = Redness, W = Warmth, M = Maceration, PU = Pre-ulcerative lesion F = Fissure, S = Swelling, D = Dryness Assessment Right: Left: Other Deformity: No No Prior Foot Ulcer: No No Prior Amputation: No No Charcot Joint: No No Ambulatory Status: Ambulatory With Help Assistance  Device: Wheelchair Gait: Surveyor, mining) Signed: 09/21/2016 5:55:37 PM By: Norris Cross, [name] ([id number]) Entered By: Alejandro Mulling on 09/21/2016 10:10:27 Misenheimer, [name] ([id number]) -------------------------------------------------------------------------------- Nutrition Risk Assessment Details Patient Name: [name] Date of Service: 09/21/2016 9:30 AM Medical Record Patient Account Number: [id number] Number: Treating RN: Phillis Haggis January 18, 1961 (55 y.o. Other Clinician: Date of Birth/Sex: Female) Treating ROBSON, MICHAEL Primary Care Physician/Extender: [name], [name] Physician: Referring Physician: Tedra Senegal in Treatment: 0 Height (in): 63 Weight (lbs): 257 Body Mass Index (BMI): 45.5 Nutrition Risk Assessment Items NUTRITION RISK SCREEN: I have an illness or condition that made me change the kind and/or 0 No amount of food I eat I eat fewer than two meals per day 0 No I eat few fruits and vegetables, or milk products 0 No I have three or more drinks of beer, liquor or wine almost every day 0 No I have tooth or mouth problems that make it hard for me to eat 0 No I don't always have enough money to buy the food I need 0 No I eat alone most of the time 0 No I take three or more different prescribed or over-the-counter drugs a 1 Yes day Without wanting to, I have lost or gained 10 pounds in the last six 2 Yes months I am not always physically able to shop, cook and/or feed myself 0 No Nutrition Protocols Good Risk Protocol Moderate Risk Protocol Electronic Signature(s) Signed: 09/21/2016 5:55:37 PM By: Alejandro Mulling Entered By: Alejandro Mulling on 09/21/2016 10:11:07

## 2016-09-23 ENCOUNTER — Ambulatory Visit
Admission: RE | Admit: 2016-09-23 | Discharge: 2016-09-23 | Disposition: A | Discharge status: Home / Self Care | Payer: Managed Care, Other (non HMO) | Source: Ambulatory Visit | Attending: Internal Medicine | Admitting: Internal Medicine

## 2016-09-23 ENCOUNTER — Other Ambulatory Visit: Payer: Self-pay | Admitting: Internal Medicine

## 2016-09-23 DIAGNOSIS — X58XXXA Exposure to other specified factors, initial encounter: Secondary | ICD-10-CM | POA: Diagnosis not present

## 2016-09-23 DIAGNOSIS — I739 Peripheral vascular disease, unspecified: Secondary | ICD-10-CM | POA: Insufficient documentation

## 2016-09-23 DIAGNOSIS — T148XXA Other injury of unspecified body region, initial encounter: Secondary | ICD-10-CM

## 2016-09-23 DIAGNOSIS — T148 Other injury of unspecified body region: Secondary | ICD-10-CM | POA: Diagnosis present

## 2016-09-24 LAB — AEROBIC CULTURE  (SUPERFICIAL SPECIMEN)

## 2016-09-24 LAB — AEROBIC CULTURE W GRAM STAIN (SUPERFICIAL SPECIMEN): Gram Stain: NONE SEEN

## 2016-09-28 ENCOUNTER — Encounter: Payer: Managed Care, Other (non HMO) | Attending: Physician Assistant | Admitting: Physician Assistant

## 2016-09-28 DIAGNOSIS — L89314 Pressure ulcer of right buttock, stage 4: Secondary | ICD-10-CM | POA: Diagnosis not present

## 2016-09-28 DIAGNOSIS — I10 Essential (primary) hypertension: Secondary | ICD-10-CM | POA: Diagnosis not present

## 2016-09-28 DIAGNOSIS — F17210 Nicotine dependence, cigarettes, uncomplicated: Secondary | ICD-10-CM | POA: Diagnosis not present

## 2016-09-28 DIAGNOSIS — K219 Gastro-esophageal reflux disease without esophagitis: Secondary | ICD-10-CM | POA: Diagnosis not present

## 2016-09-28 DIAGNOSIS — J449 Chronic obstructive pulmonary disease, unspecified: Secondary | ICD-10-CM | POA: Diagnosis not present

## 2016-09-28 DIAGNOSIS — L97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 severity: Secondary | ICD-10-CM | POA: Insufficient documentation

## 2016-09-28 DIAGNOSIS — F411 Generalized anxiety disorder: Secondary | ICD-10-CM | POA: Diagnosis not present

## 2016-09-28 DIAGNOSIS — G8929 Other chronic pain: Secondary | ICD-10-CM | POA: Insufficient documentation

## 2016-09-28 DIAGNOSIS — L89323 Pressure ulcer of left buttock, stage 3: Secondary | ICD-10-CM | POA: Insufficient documentation

## 2016-09-28 DIAGNOSIS — E1142 Type 2 diabetes mellitus with diabetic polyneuropathy: Secondary | ICD-10-CM | POA: Diagnosis not present

## 2016-09-29 NOTE — Progress Notes (Addendum)
SYBOL, MORRE ([id number]) Visit Report for 09/28/2016 Chief Complaint Document Details Patient Name: [name], [name] Date of Service: 09/28/2016 8:00 AM Medical Record Number: [id number] Patient Account Number: [id number] Date of Birth/Sex: 1961-11-21 (55 y.o. Female) Treating RN: Ashok Cordia, Debi Primary Care Physician: Joen [name] Other Clinician: Referring Physician: Joen [name] Treating Physician/Extender: Linwood Dibbles, HOYT Weeks in Treatment: 1 Information Obtained from: Patient Chief Complaint Wounds over the right ischial area left gluteal area and on the lateral aspect of the left popliteal fossa Electronic Signature(s) Signed: 09/29/2016 1:37:32 AM By: Lenda Kelp PA-C Entered By: Lenda Kelp on 09/29/2016 00:41:16 Mcclenathan, [name] ([id number]) -------------------------------------------------------------------------------- Debridement Details Patient Name: [name] Date of Service: 09/28/2016 8:00 AM Medical Record Number: [id number] Patient Account Number: [id number] Date of Birth/Sex: Jun 29, 1961 (55 y.o. Female) Treating RN: Curtis Sites Primary Care Physician: Joen [name] Other Clinician: Referring Physician: Joen [name] Treating Physician/Extender: Linwood Dibbles, HOYT Weeks in Treatment: 1 Debridement Performed for Wound #3 Right Gluteal fold Assessment: Performed By: Physician STONE III, HOYT, Debridement: Debridement Pre-procedure Yes - 10:15 Verification/Time Out Taken: Start Time: 10:16 Pain Control: Other : lidocaine 4% Level: Skin/Subcutaneous Tissue Total Area Debrided (L x 2.4 (cm) x 1.8 (cm) = 4.32 (cm) W): Tissue and other Viable, Non-Viable, Exudate, Fat, Fibrin/Slough, Subcutaneous material debrided: Instrument: Curette Bleeding: Minimum Hemostasis Achieved: Pressure End Time: 10:24 Procedural Pain: 0 Post Procedural Pain: 0 Response to Treatment: Procedure was tolerated well Post Debridement Measurements of Total Wound Length: (cm)  2.4 Stage: Category/Stage IV Width: (cm) 1.8 Depth: (cm) 5.5 Volume: (cm) 18.661 Character of Wound/Ulcer Post Requires Further Debridement: Debridement Severity of Tissue Post Fat layer exposed Debridement: Post Procedure Diagnosis Same as Pre-procedure Electronic Signature(s) Signed: 09/28/2016 12:28:11 PM By: Curtis Sites Signed: 09/29/2016 1:37:32 AM By: Lenda Kelp PA-C Entered By: Curtis Sites on 09/28/2016 10:25:00 Wymer, Jared L. ([id number]) Doggett, Moriyah Elbert Ewings ([id number]) -------------------------------------------------------------------------------- HPI Details Patient Name: [name] Date of Service: 09/28/2016 8:00 AM Medical Record Number: [id number] Patient Account Number: [id number] Date of Birth/Sex: 26-Nov-1961 (55 y.o. Female) Treating RN: Ashok Cordia, Debi Primary Care Physician: Joen [name] Other Clinician: Referring Physician: Joen [name] Treating Physician/Extender: Linwood Dibbles, HOYT Weeks in Treatment: 1 History of Present Illness Location: bilateral gluteal ulcerations and left popliteal ulceration with tunneling Quality: Patient reports experiencing a dull pain to affected area(s). Severity: Patient states wound are getting worse. Duration: Patient has had the wound for < 2 weeks prior to presenting for treatment Timing: The pain is intermittent in severity as far as how intense it becomes but is present all the time. Manipulationn makes this worse. Context: The wound occurred when the patient had a fall and was unconscious for about 48 hours laying on the floor and she had pressure injury at that stage. Modifying Factors: Other treatment(s) tried include:as noted below she has been seen by visiting wound care physicians or nurse practitioners and details have been noted Associated Signs and Symptoms: Patient reports having difficulty standing for long periods. HPI Description: 55 year old patient who was seen by visiting [name] wound care  specialist for a wound on both her buttock and was found to have an unstageable wound on the right buttock for about 2 months. I understand that she had a fall and was laying on the floor for about 48 hours before she was found and taken to the ICU and had a long injury to her gluteal area from pressure and also had broken her right humerus. She has had a right  proximal humerus fracture and has been followed up with orthopedics recently. The patient has a past medical history of type 2 diabetes mellitus, paraparesis, acute pyelonephritis, GERD, hypertension, glaucoma, chronic pain, anxiety neurosis, nicotine dependence, COPD. the patient had some debridement done and was to operative was recommended to use Silvadene dressing and offloading. She is a smoker and occasionally smokes a few cigarettes. the patient requested a second opinion for months and is here to discuss her care. 09/21/16; the patient re-presents from home today for review of 3 different wounds. I note that she was seen in the clinic here in July at which time she had bilateral buttock wounds. It was apparently suggested at that time that she use a wound VAC bridged to both wounds just near the initial tuberosity's bilaterally which she refused. The history was a bit difficult to put together. Apparently this patient became ill at the end of April of this year. She was found sitting on the floor she had apparently been for 2 days and subsequently admitted to hospital from 04/23/16 through 05/02/16 and at that point she was critically ill ultimately having sepsis secondary to UTI, nontraumatic rhabdomyolysis and diabetic ketoacidosis. She had acute renal failure and I think required ICU care including intubation. Patient states her wounds actually started at that point on the bilateral issue tuberosities however in reviewing the discharge summary from 5/8 I see no reference to wounds at that point. It did state that she had left lower  extremity cellulitis however. Reviewing Epic I see no relevant x-rays. It would appear that her discharge creatinine was within the normal range and indeed on 9/15 her creatinine has remained normal. [facility] There the wounds on her bilateral Botox were dressed. Only just before her discharge from the nursing facility she developed an "knot" which was interpreted as cellulitis on the posterior aspect of her Aston, Shyann L. ([id number]) left knee she was given antibiotics. Apparently sometime late in July a this actually opened and became a wound at home health care was tending to however she is still having purulent drainage coming from this and by my understanding the wound depth is actually become unmeasurable. I am not really clear about what home health has been placing in any of these wound areas. The patient states that is something with silver and it. She is not been systemically unwell no fever or chills her appetite is good. She is a diabetic poorly controlled however she states that her recent blood sugars at home have been in the low to mid 100s. 09/28/16 On evaluation today patient appears to continue to exhibit the 3 areas of ulceration that were noted previous. She did have an x-ray of the right pelvis which showed evidence of potential soft tissue infection but no obvious osteomyelitis. There was a discussion last office visit concerning the possibility of a wound VAC. Witth that being said the x-ray report suggested that an MRI may be more appropriate to further evaluate the extent. Subsequently in regard to the wound over the popliteal portion of the left lower extremity with tunneling at 12:00 the CT scan that was ordered was denied by insurance as they state the patient has not had x-rays prior to advanced imaging. Patient states that she is frustrated with the situation overall. Electronic Signature(s) Signed:  09/29/2016 1:37:32 AM By: Lenda Kelp PA-C Entered By: Lenda Kelp on 09/29/2016 00:47:11 Waites, [name] ([id number]) -------------------------------------------------------------------------------- Physical Exam Details  Patient Name: SHAYLEIGH, MERLINI Date of Service: 09/28/2016 8:00 AM Medical Record Number: [id number] Patient Account Number: [id number] Date of Birth/Sex: Sep 26, 1961 (55 y.o. Female) Treating RN: Ashok Cordia, Debi Primary Care Physician: Joen [name] Other Clinician: Referring Physician: Joen [name] Treating Physician/Extender: STONE III, HOYT Weeks in Treatment: 1 Constitutional Well-nourished and well-hydrated in no acute distress. Respiratory normal breathing without difficulty. Psychiatric this patient is able to make decisions and demonstrates good insight into disease process. Alert and Oriented x 3. pleasant and cooperative. Electronic Signature(s) Signed: 09/29/2016 1:37:32 AM By: Lenda Kelp PA-C Entered By: Lenda Kelp on 09/29/2016 00:48:29 Mclane, [name] ([id number]) -------------------------------------------------------------------------------- Physician Orders Details Patient Name: [name] Date of Service: 09/28/2016 8:00 AM Medical Record Number: [id number] Patient Account Number: [id number] Date of Birth/Sex: 1961-01-17 (55 y.o. Female) Treating RN: Curtis Sites Primary Care Physician: Joen [name] Other Clinician: Referring Physician: Joen [name] Treating Physician/Extender: Skeet Simmer in Treatment: 1 Verbal / Phone Orders: Yes Clinician: Curtis Sites Read Back and Verified: Yes Diagnosis Coding Wound Cleansing Wound #3 Right Gluteal fold o Clean wound with Normal Saline. Wound #4 Left Gluteal fold o Clean wound with Normal Saline. Wound #5 Posterior Knee o Clean wound with Normal Saline. Anesthetic Wound #3 Right Gluteal fold o Topical Lidocaine 4% cream applied to wound bed prior to debridement -  clinic use only Wound #4 Left Gluteal fold o Topical Lidocaine 4% cream applied to wound bed prior to debridement - clinic use only Wound #5 Posterior Knee o Topical Lidocaine 4% cream applied to wound bed prior to debridement - clinic use only Skin Barriers/Peri-Wound Care Wound #3 Right Gluteal fold o Skin Prep Wound #4 Left Gluteal fold o Skin Prep Wound #5 Posterior Knee o Skin Prep Primary Wound Dressing Wound #3 Right Gluteal fold o Aquacel Ag - Silver Alginate Rope Wound #4 Left Gluteal fold o Aquacel Ag - Silver Alginate Hardge, Irasema L. ([id number]) Wound #5 Posterior Knee o Aquacel Ag - Silver Alginate Rope Secondary Dressing Wound #3 Right Gluteal fold o Dry Gauze o Boardered Foam Dressing Wound #4 Left Gluteal fold o Dry Gauze o Boardered Foam Dressing Wound #5 Posterior Knee o Dry Gauze o Boardered Foam Dressing Dressing Change Frequency Wound #3 Right Gluteal fold o Change dressing every other day. Wound #4 Left Gluteal fold o Change dressing every other day. Wound #5 Posterior Knee o Change dressing every other day. Follow-up Appointments Wound #3 Right Gluteal fold o Return Appointment in 1 week. Wound #4 Left Gluteal fold o Return Appointment in 1 week. Wound #5 Posterior Knee o Return Appointment in 1 week. Off-Loading Wound #3 Right Gluteal fold o Turn and reposition every 2 hours Wound #4 Left Gluteal fold o Turn and reposition every 2 hours Wound #5 Posterior Knee o Turn and reposition every 2 hours Drum, Yevonne L. ([id number]) Home Health Wound #3 Right Gluteal fold o Continue [facility] Health Visits - [facility] Nurse may visit PRN to address patientos wound care needs. o FACE TO FACE ENCOUNTER: MEDICARE and MEDICAID PATIENTS: I certify that this patient is under my care and that I had a face-to-face encounter that meets the physician face-to-face encounter  requirements with this patient on this date. The encounter with the patient was in whole or in part for the following MEDICAL CONDITION: (primary reason for Home Healthcare) MEDICAL NECESSITY: I certify, that based on my findings, NURSING services are a medically necessary home health service. HOME BOUND STATUS:  I certify that my clinical findings support that this patient is homebound (i.e., Due to illness or injury, pt requires aid of supportive devices such as crutches, cane, wheelchairs, walkers, the use of special transportation or the assistance of another person to leave their place of residence. There is a normal inability to leave the home and doing so requires considerable and taxing effort. Other absences are for medical reasons / religious services and are infrequent or of short duration when for other reasons). o If current dressing causes regression in wound condition, may D/C ordered dressing product/s and apply Normal Saline Moist Dressing daily until next Wound Healing Center / Other MD appointment. Notify Wound Healing Center of regression in wound condition at [phone]. o Please direct any NON-WOUND related issues/requests for orders to patient's Primary Care Physician Wound #4 Left Gluteal fold o Continue [facility] Health Visits - [facility] Nurse may visit PRN to address patientos wound care needs. o FACE TO FACE ENCOUNTER: MEDICARE and MEDICAID PATIENTS: I certify that this patient is under my care and that I had a face-to-face encounter that meets the physician face-to-face encounter requirements with this patient on this date. The encounter with the patient was in whole or in part for the following MEDICAL CONDITION: (primary reason for Home Healthcare) MEDICAL NECESSITY: I certify, that based on my findings, NURSING services are a medically necessary home health service. HOME BOUND STATUS: I certify that my clinical  findings support that this patient is homebound (i.e., Due to illness or injury, pt requires aid of supportive devices such as crutches, cane, wheelchairs, walkers, the use of special transportation or the assistance of another person to leave their place of residence. There is a normal inability to leave the home and doing so requires considerable and taxing effort. Other absences are for medical reasons / religious services and are infrequent or of short duration when for other reasons). o If current dressing causes regression in wound condition, may D/C ordered dressing product/s and apply Normal Saline Moist Dressing daily until next Wound Healing Center / Other MD appointment. Notify Wound Healing Center of regression in wound condition at [phone]. o Please direct any NON-WOUND related issues/requests for orders to patient's Primary Care Physician Wound #5 Posterior Knee o Continue Home Health Visits - [facility] Nurse may visit PRN to address patientos wound care needs. o FACE TO FACE ENCOUNTER: MEDICARE and MEDICAID PATIENTS: I certify that this patient is under my care and that I had a face-to-face encounter that meets the physician face-to-face encounter requirements with this patient on this date. The encounter with the patient was in whole or in part for the following MEDICAL CONDITION: (primary reason for Home Healthcare) Storrs, TERESSA MCGLOCKLIN ([id number]) MEDICAL NECESSITY: I certify, that based on my findings, NURSING services are a medically necessary home health service. HOME BOUND STATUS: I certify that my clinical findings support that this patient is homebound (i.e., Due to illness or injury, pt requires aid of supportive devices such as crutches, cane, wheelchairs, walkers, the use of special transportation or the assistance of another person to leave their place of residence. There is a normal inability to leave the home and doing so  requires considerable and taxing effort. Other absences are for medical reasons / religious services and are infrequent or of short duration when for other reasons). o If current dressing causes regression in wound condition, may D/C ordered dressing product/s and apply Normal  Saline Moist Dressing daily until next Wound Healing Center / Other MD appointment. Notify Wound Healing Center of regression in wound condition at [phone]. o Please direct any NON-WOUND related issues/requests for orders to patient's Primary Care Physician Medications-please add to medication list. Wound #3 Right Gluteal fold o Other: - Vitamin C, Zinc, Multivitamin Wound #4 Left Gluteal fold o Other: - Vitamin C, Zinc, Multivitamin Wound #5 Posterior Knee o Other: - Vitamin C, Zinc, Multivitamin Radiology o X-ray, knee - Left posterior knee Electronic Signature(s) Signed: 09/28/2016 12:28:11 PM By: Curtis Sites Signed: 09/29/2016 1:37:32 AM By: Lenda Kelp PA-C Entered By: Curtis Sites on 09/28/2016 10:41:27 Snowden, [name] ([id number]) -------------------------------------------------------------------------------- Problem List Details Patient Name: [name] Date of Service: 09/28/2016 8:00 AM Medical Record Number: [id number] Patient Account Number: [id number] Date of Birth/Sex: 12/05/1961 (55 y.o. 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 Female) Treating RN: Ashok Cordia, Debi Primary Care Physician: Joen [name] Other Clinician: Referring Physician: Joen [name] Treating Physician/Extender: Linwood Dibbles, HOYT Weeks in Treatment: 1 Subjective Chief Complaint Information obtained from Patient Wounds over the right ischial area left gluteal area and on the lateral aspect of the left popliteal fossa History of Present Illness (HPI) The following HPI elements were documented for the patient's wound: Location: bilateral gluteal ulcerations and left popliteal ulceration with tunneling Quality: Patient reports experiencing a dull pain to affected area(s). Severity: Patient states wound are getting worse. Duration: Patient has had the wound for < 2 weeks prior to presenting for treatment Timing: The pain is intermittent in severity as far as how intense it becomes but is present all the time. Manipulationn makes this worse. Context: The wound occurred when the patient had a fall and was unconscious for about 48 hours laying on the floor and she had pressure injury at that stage. Modifying Factors: Other treatment(s) tried include:as noted below she has been seen by visiting wound care physicians or nurse practitioners and details have been noted Associated Signs and Symptoms: Patient reports having difficulty standing for long periods. 55 year old patient who was seen by visiting [name] wound care specialist for a wound on both her buttock and was found to have an unstageable wound on the right  buttock for about 2 months. I understand that she had a fall and was laying on the floor for about 48 hours before she was found and taken to the ICU and had a long injury to her gluteal area from pressure and also had broken her right humerus. She has had a right proximal humerus fracture and has been followed up with orthopedics recently. The patient has a past medical history of type 2 diabetes mellitus, paraparesis, acute pyelonephritis, GERD, hypertension, glaucoma, chronic pain, anxiety neurosis, nicotine dependence, COPD. the patient had some debridement  done and was to operative was recommended to use Silvadene dressing and offloading. She is a smoker and occasionally smokes a few cigarettes. the patient requested a second opinion for months and is here to discuss her care. 09/21/16; the patient re-presents from home today for review of 3 different wounds. I note that she was seen in the clinic here in July at which time she had bilateral buttock wounds. It was apparently suggested at that time that she use a wound VAC bridged to both wounds just near the initial tuberosity's bilaterally which she refused. The history was a bit difficult to put together. Apparently this patient became ill at the end of April of this year. She was found sitting on the floor she had apparently been for 2 days and subsequently admitted to hospital from 04/23/16 through 05/02/16 and at that point she was critically ill ultimately having sepsis Schnitker, Liala L. ([id number]) secondary to UTI, nontraumatic rhabdomyolysis and diabetic ketoacidosis. She had acute renal failure and I think required ICU care including intubation. Patient states her wounds actually started at that point on the bilateral issue tuberosities however in reviewing the discharge summary from 5/8 I see no reference to wounds at that point. It did state that she had left lower extremity cellulitis however. Reviewing Epic I see no relevant x-rays.  It would appear that her discharge creatinine was within the normal range and indeed on 9/15 her creatinine has remained normal. [facility] There the wounds on her bilateral Botox were dressed. Only just before her discharge from the nursing facility she developed an "knot" which was interpreted as cellulitis on the posterior aspect of her left knee she was given antibiotics. Apparently sometime late in July a this actually opened and became a wound at home health care was tending to however she is still having purulent drainage coming from this and by my understanding the wound depth is actually become unmeasurable. I am not really clear about what home health has been placing in any of these wound areas. The patient states that is something with silver and it. She is not been systemically unwell no fever or chills her appetite is good. She is a diabetic poorly controlled however she states that her recent blood sugars at home have been in the low to mid 100s. 09/28/16 On evaluation today patient appears to continue to exhibit the 3 areas of ulceration that were noted previous. She did have an x-ray of the right pelvis which showed evidence of potential soft tissue infection but no obvious osteomyelitis. There was a discussion last office visit concerning the possibility of a wound VAC. Witth that being said the x-ray report suggested that an MRI may be more appropriate to further evaluate the extent. Subsequently in regard to the wound over the popliteal portion of the left lower extremity with tunneling at 12:00 the CT scan that was ordered was denied by insurance as they state the patient has not had x-rays prior to advanced imaging. Patient states that she is frustrated with the situation overall. Objective Constitutional Well-nourished and well-hydrated in no acute distress. Vitals Time Taken: 9:25 AM, Height: 63 in, Weight: 257  lbs, BMI: 45.5, Temperature: 98 F, Pulse: 77 bpm, Respiratory Rate: 18 breaths/min, Blood Pressure: 120/78 mmHg. Respiratory normal breathing without difficulty. Psychiatric this patient is able to make decisions and demonstrates good insight into disease process. Alert and Oriented x 3. pleasant and cooperative. Integumentary (Hair, Skin) Wound #  3 status is Open. Original cause of wound was Pressure Injury. The wound is located on the Right Volk, Zaiya L. ([id number]) Gluteal fold. The wound measures 2.4cm length x 1.8cm width x 5.5cm depth; 3.393cm^2 area and 18.661cm^3 volume. There is muscle, fat, and fascia exposed. There is a large amount of serous drainage noted. The wound margin is distinct with the outline attached to the wound base. There is medium (34-66%) pale granulation within the wound bed. There is a medium (34-66%) amount of necrotic tissue within the wound bed including Adherent Slough. The periwound skin appearance exhibited: Maceration, Moist. Periwound temperature was noted as No Abnormality. The periwound has tenderness on palpation. Wound #4 status is Open. Original cause of wound was Pressure Injury. The wound is located on the Left Gluteal fold. The wound measures 2.3cm length x 5cm width x 1.2cm depth; 9.032cm^2 area and 10.838cm^3 volume. There is muscle and fat exposed. There is no undermining noted, however, there is tunneling at 9:00 with a maximum distance of 1.2cm. There is a large amount of serous drainage noted. The wound margin is distinct with the outline attached to the wound base. There is small (1-33%) red, pink granulation within the wound bed. There is a large (67-100%) amount of necrotic tissue within the wound bed including Adherent Slough. The periwound skin appearance exhibited: Maceration, Moist. Periwound temperature was noted as No Abnormality. The periwound has tenderness on palpation. Wound #5 status is Open. Original cause of wound was  Gradually Appeared. The wound is located on the Posterior Knee. The wound measures 0.8cm length x 0.8cm width x 1.5cm depth; 0.503cm^2 area and 0.754cm^3 volume. There is muscle and fat exposed. There is tunneling at 9:00 with a maximum distance of 5.8cm. There is a large amount of purulent drainage noted. The wound margin is flat and intact. There is large (67-100%) pink granulation within the wound bed. There is a small (1-33%) amount of necrotic tissue within the wound bed including Eschar and Adherent Slough. The periwound skin appearance exhibited: Localized Edema, Maceration, Moist, Rubor, Erythema. The periwound skin appearance did not exhibit: Callus, Crepitus, Excoriation, Fluctuance, Friable, Induration, Rash, Scarring, Dry/Scaly, Atrophie Blanche, Cyanosis, Ecchymosis, Hemosiderin Staining, Mottled, Pallor. The surrounding wound skin color is noted with erythema which is circumferential. Periwound temperature was noted as Hot. The periwound has tenderness on palpation. 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 ([id number]) Procedures Wound #3 Wound #3 is a Pressure Ulcer located on the Right Gluteal fold . There was a Skin/Subcutaneous Tissue Debridement ([id number]) debridement with total area of 4.32 sq cm performed by STONE III, HOYT. with the following instrument(s): Curette to remove Viable and Non-Viable tissue/material including Exudate, Fat, Fibrin/Slough, and Subcutaneous  after achieving pain control using Other (lidocaine 4%). A time out was conducted at 10:15, prior to the start of the procedure. A Minimum amount of bleeding was controlled with Pressure. The procedure was tolerated well with a pain level of 0 throughout and a pain level of 0 following the procedure. Post Debridement Measurements: 2.4cm length x 1.8cm width x 5.5cm depth;  18.661cm^3 volume. Post debridement Stage noted as Category/Stage IV. Character of Wound/Ulcer Post Debridement requires further debridement. Severity of Tissue Post Debridement is: Fat layer exposed. Post procedure Diagnosis Wound #3: Same as Pre-Procedure Plan Wound Cleansing: Wound #3 Right Gluteal fold: Clean wound with Normal Saline. Wound #4 Left Gluteal fold: Clean wound with Normal Saline. Wound #5 Posterior Knee: Clean wound with Normal Saline. Anesthetic: Wound #3 Right Gluteal fold: Topical Lidocaine 4% cream applied to wound bed prior to debridement - clinic use only Wound #4 Left Gluteal fold: Topical Lidocaine 4% cream applied to wound bed prior to debridement - clinic use only Wound #5 Posterior Knee: Topical Lidocaine 4% cream applied to wound bed prior to debridement - clinic use only Skin Barriers/Peri-Wound Care: Wound #3 Right Gluteal fold: Skin Prep Wound #4 Left Gluteal fold: Skin Prep Wound #5 Posterior Knee: Skin Prep Primary Wound Dressing: Wound #3 Right Gluteal fold: Aquacel Ag - Silver Alginate Rope Wound #4 Left Gluteal fold: Keep, Aldona L. ([id number]) Aquacel Ag - Silver Alginate Wound #5 Posterior Knee: Aquacel Ag - Silver Alginate Rope Secondary Dressing: Wound #3 Right Gluteal fold: Dry Gauze Boardered Foam Dressing Wound #4 Left Gluteal fold: Dry Gauze Boardered Foam Dressing Wound #5 Posterior Knee: Dry Gauze Boardered Foam Dressing Dressing Change Frequency: Wound #3 Right Gluteal fold: Change dressing every other day. Wound #4 Left Gluteal fold: Change dressing  every other day. Wound #5 Posterior Knee: Change dressing every other day. Follow-up Appointments: Wound #3 Right Gluteal fold: Return Appointment in 1 week. Wound #4 Left Gluteal fold: Return Appointment in 1 week. Wound #5 Posterior Knee: Return Appointment in 1 week. Off-Loading: Wound #3 Right Gluteal fold: Turn and reposition every 2 hours Wound #4 Left Gluteal fold: Turn and reposition every 2 hours Wound #5 Posterior Knee: Turn and reposition every 2 hours Home Health: Wound #3 Right Gluteal fold: Continue Home Health Visits - [facility] Nurse may visit PRN to address patient s wound care needs. FACE TO FACE ENCOUNTER: MEDICARE and MEDICAID PATIENTS: I certify that this patient is under my care and that I had a face-to-face encounter that meets the physician face-to-face encounter requirements with this patient on this date. The encounter with the patient was in whole or in part for the following MEDICAL CONDITION: (primary reason for Home Healthcare) MEDICAL NECESSITY: I certify, that based on my findings, NURSING services are a medically necessary home health service. HOME BOUND STATUS: I certify that my clinical findings support that this patient is homebound (i.e., Due to illness or injury, pt requires aid of supportive devices such as crutches, cane, wheelchairs, walkers, the use of special transportation or the assistance of another person to leave their place of residence. There is a normal inability to leave the home and doing so requires considerable and taxing effort. Other absences are for medical reasons / religious services and are infrequent or of short duration when for other reasons). If current dressing causes regression in wound condition, may D/C ordered dressing product/s and apply Normal Saline Moist Dressing daily until next Wound Healing Center / Other MD appointment. Notify Wound Healing Center of regression in wound condition  at [phone]. Couse, Chezney L. ([id number]) Please direct any NON-WOUND related issues/requests for orders to patient's Primary Care Physician Wound #4 Left Gluteal fold: Continue Home Health Visits - [facility] Nurse may visit PRN to address patient s wound care needs. FACE TO FACE ENCOUNTER: MEDICARE and MEDICAID PATIENTS:  I certify that this patient is under my care and that I had a face-to-face encounter that meets the physician face-to-face encounter requirements with this patient on this date. The encounter with the patient was in whole or in part for the following MEDICAL CONDITION: (primary reason for Home Healthcare) MEDICAL NECESSITY: I certify, that based on my findings, NURSING services are a medically necessary home health service. HOME BOUND STATUS: I certify that my clinical findings support that this patient is homebound (i.e., Due to illness or injury, pt requires aid of supportive devices such as crutches, cane, wheelchairs, walkers, the use of special transportation or the assistance of another person to leave their place of residence. There is a normal inability to leave the home and doing so requires considerable and taxing effort. Other absences are for medical reasons / religious services and are infrequent or of short duration when for other reasons). If current dressing causes regression in wound condition, may D/C ordered dressing product/s and apply Normal Saline Moist Dressing daily until next Wound Healing Center / Other MD appointment. Notify Wound Healing Center of regression in wound condition at [phone]. Please direct any NON-WOUND related issues/requests for orders to patient's Primary Care Physician Wound #5 Posterior Knee: [facility] Nurse may visit PRN to address patient s wound care needs. FACE TO FACE ENCOUNTER: MEDICARE and MEDICAID PATIENTS: I certify that this  patient is under my care and that I had a face-to-face encounter that meets the physician face-to-face encounter requirements with this patient on this date. The encounter with the patient was in whole or in part for the following MEDICAL CONDITION: (primary reason for Home Healthcare) MEDICAL NECESSITY: I certify, that based on my findings, NURSING services are a medically necessary home health service. HOME BOUND STATUS: I certify that my clinical findings support that this patient is homebound (i.e., Due to illness or injury, pt requires aid of supportive devices such as crutches, cane, wheelchairs, walkers, the use of special transportation or the assistance of another person to leave their place of residence. There is a normal inability to leave the home and doing so requires considerable and taxing effort. Other absences are for medical reasons / religious services and are infrequent or of short duration when for other reasons). If current dressing causes regression in wound condition, may D/C ordered dressing product/s and apply Normal Saline Moist Dressing daily until next Wound Healing Center / Other MD appointment. Notify Wound Healing Center of regression in wound condition at [phone]. Please direct any NON-WOUND related issues/requests for orders to patient's Primary Care Physician Medications-please add to medication list.: Wound #3 Right Gluteal fold: Other: - Vitamin C, Zinc, Multivitamin Wound #4 Left Gluteal fold: Other: - Vitamin C, Zinc, Multivitamin Wound #5 Posterior Knee: Other: - Vitamin C, Zinc, Multivitamin Radiology ordered were: X-ray, knee - Left posterior knee Follow-Up Appointments: A follow-up appointment should be scheduled. [facility] Patient Clinical [facility] Clinkscale, Ileigh L. ([id number]) In regard to patient's wounds were again continue to pack these with the Aquacel Ag and Aquasol AG rope. With that being said I am recommending that  we should potentially order an MRI for the pelvis to rule out osteomyelitis as the patient is wanting to proceed with the wound VAC for the right issue area if indeed this is appropriate. With that being said I am ordering an x-ray today for the left knee in order to satisfy insurance  requirements for advanced imaging in regard to left knee. Subsequently once we get the results of this back and depending on the findings I would potentially recommend an MRI of the left knee as well in order to ascertain the extent of this wound. Patient is in agreement with this plan. She would really like to do the MRIs simultaneously when she does have to do them as she has to have someone drive her to all of her appointments and this could become burdensome to those individuals. I think that is okay and therefore I will hold off on orderinng MRIs until next week once we have the results of the x-ray for the left knee back. Electronic Signature(s) Signed: 10/19/2016 9:04:35 PM By: Lenda Kelp PA-C Previous Signature: 09/29/2016 1:37:32 AM Version By: Lenda Kelp PA-C Entered By: Lenda Kelp on 10/19/2016 21:04:34 Nasser, [name] ([id number]) -------------------------------------------------------------------------------- SuperBill Details Patient Name: [name] Date of Service: 09/28/2016 Medical Record Number: [id number] Patient Account Number: [id number] Date of Birth/Sex: December 14, 1961 (55 y.o. 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 E11.42 Type 2 diabetes mellitus with diabetic polyneuropathy Facility Procedures CPT4  Code: [id number] Description: 11042 - DEB SUBQ TISSUE 20 SQ CM/< ICD-10 Description Diagnosis L89.314 Pressure ulcer of right buttock, stage 4 Modifier: Quantity: 1 Physician Procedures CPT4 Code Description: [id number] 99214 - WC PHYS LEVEL 4 - EST PT ICD-10 Description Diagnosis L89.323 Pressure ulcer of left buttock, stage 3 L89.314 Pressure ulcer of right buttock, stage 4 L97.129 Non-pressure chronic ulcer of left thigh with unspeci  E11.42 Type 2 diabetes mellitus with diabetic polyneuropathy Modifier: 25 fied severit Quantity: 1 y CPT4 Code Description: [id number] 11042 - WC PHYS SUBQ TISS 20 SQ CM ICD-10 Description Diagnosis L89.314 Pressure ulcer of right buttock, stage 4 Modifier: Quantity: 1 Electronic Signature(s) Signed: 09/29/2016 1:37:32 AM By: Lenda Kelp PA-C Entered By: Lenda Kelp on 09/29/2016 00:51:41

## 2016-09-30 NOTE — Progress Notes (Signed)
Katelyn, [name] ([id number]) Visit Report for 09/21/2016 Chief Complaint Document Details Patient Name: [name], [name] Date of Service: 09/21/2016 9:30 AM Medical Record Patient Account Number: [id number] Number: Treating RN: Phillis Haggis 1961/01/04 (55 y.o. Other Clinician: Date of Birth/Sex: Female) Treating ROBSON, MICHAEL Primary Care Physician/Extender: Leticia Penna Physician: Referring Physician: Tedra Senegal in Treatment: 0 Information Obtained from: Patient Chief Complaint Patient is at the clinic for treatment of an open pressure ulcer 2 bilateral gluteal area for about 3 months 09/21/16; patient repeat present Center clinic today with wounds over the right ischial area left visual area and on the lateral aspect of the left popliteal fossa Electronic Signature(s) Signed: 09/30/2016 8:10:29 AM By: Baltazar Najjar MD Entered By: Baltazar Najjar on 09/21/2016 12:40:55 [name], [name] -------------------------------------------------------------------------------- Debridement Details Patient Name: [name] Date of Service: 09/21/2016 9:30 AM Medical Record Patient Account Number: [id number] Number: Treating RN: Phillis Haggis 06/04/1961 (55 y.o. Other Clinician: Date of Birth/Sex: Female) Treating ROBSON, MICHAEL Primary Care Physician/Extender: [name], [name] Physician: Referring Physician: Tedra Senegal in Treatment: 0 Debridement Performed for Wound #4 Left Gluteal fold Assessment: Performed By: Physician Maxwell Caul, MD Debridement: Debridement Pre-procedure Yes - 10:12 Verification/Time Out Taken: Start Time: 10:13 Pain Control: Lidocaine 4% Topical Solution Level: Skin/Subcutaneous Tissue Total Area Debrided ([name]: Tissue and other Viable, Non-Viable, Exudate, Fibrin/Slough, Subcutaneous material debrided: Instrument: Other : scoop Bleeding: Minimum Hemostasis  Achieved: Pressure End Time: 10:17 Procedural Pain: 0 Post Procedural Pain: 0 Response to Treatment: Procedure was tolerated well Post Debridement Measurements of Total Wound Length: (cm) 1.9 Stage: Category/Stage IV Width: (cm) 5.2 Depth: (cm) 2 Volume: (cm) 15.519 Character of Wound/Ulcer Post Stable Debridement: Severity of Tissue Post Debridement: Fat layer exposed Post Procedure Diagnosis Same as Pre-procedure Electronic Signature(s) Signed: 09/21/2016 5:55:37 PM By: Norris Cross, [name] ([id number]) Signed: 09/30/2016 8:10:29 AM By: Baltazar Najjar MD Entered By: Baltazar Najjar on 09/21/2016 12:40:04 [name], [name] ([id number]) -------------------------------------------------------------------------------- HPI Details Patient Name: [name] Date of Service: 09/21/2016 9:30 AM Medical Record Patient Account Number: [id number] Number: Treating RN: Phillis Haggis 1961/09/03 (55 y.o. Other Clinician: Date of Birth/Sex: Female) Treating ROBSON, MICHAEL Primary Care Physician/Extender: [name], [name] Physician: Referring Physician: Tedra Senegal in Treatment: 0 History of Present Illness Location: bilateral gluteal ulcerations Quality: Patient reports experiencing a dull pain to affected area(s). Severity: Patient states wound are getting worse. Duration: Patient has had the wound for < 2 weeks prior to presenting for treatment Timing: Pain in wound is Intermittent (comes and goes Context: The wound occurred when the patient had a fall and was unconscious for about 48 hours laying on the floor and she had pressure injury at that stage. Modifying Factors: Other treatment(s) tried include:as noted below she has been seen by visiting wound care physicians or nurse practitioners and details have been noted Associated Signs and Symptoms: Patient reports having difficulty standing for long periods. HPI Description: 55 year old patient who was  seen by visiting [name] wound care specialist for a wound on both her buttock and was found to have an unstageable wound on the right buttock for about 2 months. I understand that she had a fall and was laying on the floor for about 48 hours before she was found and taken to the ICU and had a long injury to her gluteal area from pressure and also had broken her right humerus. She  has had a right proximal humerus fracture and has been followed up with orthopedics recently. The patient has a past medical history of type 2 diabetes mellitus, paraparesis, acute pyelonephritis, GERD, hypertension, glaucoma, chronic pain, anxiety neurosis, nicotine dependence, COPD. the patient had some debridement done and was to operative was recommended to use Silvadene dressing and offloading. She is a smoker and occasionally smokes a few cigarettes. the patient requested a second opinion for months and is here to discuss her care. 09/21/16; the patient re-presents from home today for review of 3 different wounds. I note that she was seen in the clinic here in July at which time she had bilateral buttock wounds. It was apparently suggested at that time that she use a wound VAC bridged to both wounds just near the initial tuberosity's bilaterally which she refused. The history was a bit difficult to put together. Apparently this patient became ill at the end of April of this year. She was found sitting on the floor she had apparently been for 2 days and subsequently admitted to hospital from 04/23/16 through 05/02/16 and at that point she was critically ill ultimately having sepsis secondary to UTI, nontraumatic rhabdomyolysis and diabetic ketoacidosis. She had acute renal failure and I think required ICU care including intubation. Patient states her wounds actually started at that point on the bilateral issue tuberosities however in reviewing the discharge summary from 5/8 I see no reference to wounds at that point. It  did state that she had left lower extremity cellulitis however. Reviewing Epic I see no relevant x-rays. It would appear that her discharge creatinine was within the normal range and indeed on 9/15 her creatinine has remained normal. [facility] [name], [name]. ([id number]) rehabilitation. There the wounds on her bilateral Botox were dressed. Only just before her discharge from the nursing facility she developed an "knot" which was interpreted as cellulitis on the posterior aspect of her left knee she was given antibiotics. Apparently sometime late in July a this actually opened and became a wound at home health care was tending to however she is still having purulent drainage coming from this and by my understanding the wound depth is actually become unmeasurable. I am not really clear about what home health has been placing in any of these wound areas. The patient states that is something with silver and it. She is not been systemically unwell no fever or chills her appetite is good. She is a diabetic poorly controlled however she states that her recent blood sugars at home have been in the low to mid 100s. Electronic Signature(s) Signed: 09/30/2016 8:10:29 AM By: Baltazar Najjar MD Entered By: Baltazar Najjar on 09/21/2016 12:49:13 [name], [name] ([id number]) -------------------------------------------------------------------------------- Physical Exam Details Patient Name: [name] Date of Service: 09/21/2016 9:30 AM Medical Record Patient Account Number: [id number] Number: Treating RN: Phillis Haggis 10-22-1961 (55 y.o. Other Clinician: Date of Birth/Sex: Female) Treating ROBSON, MICHAEL Primary Care Physician/Extender: Leticia Penna Physician: Referring Physician: Tedra Senegal in Treatment: 0 Constitutional Sitting or standing Blood Pressure is within target range for patient.. Pulse regular and within target  range for patient.Marland Kitchen Respirations regular, non-labored and within target range.. Temperature is normal and within the target range for the patient.. Patient is in no distress. Eyes Conjunctivae clear. No discharge.Marland Kitchen Respiratory Respiratory effort is easy and symmetric bilaterally. Rate is normal at rest and on room air.. Bilateral breath sounds are clear and equal in all  lobes with no wheezes, rales or rhonchi.. Cardiovascular Heart rhythm and rate regular, without murmur or gallop.. Pedal pulses palpable and strong bilaterally.. Gastrointestinal (GI) Abdomen is soft and non-distended without masses or tenderness. Bowel sounds active in all quadrants.. No liver or spleen enlargement or tenderness.. Genitourinary (GU) Bladder without fullness, masses or tenderness.. Lymphatic None palpable in the inguinal area. Musculoskeletal Careful examination of the left knee revealed no effusion no warmth no instability and no tenderness. Neurological Although the patient complains of neuropathic symptoms her exam to vibration sense was normal. Psychiatric No evidence of depression, anxiety, or agitation. Calm, cooperative, and communicative. Appropriate interactions and affect.. Notes Wound exam; there are actually 3 concerning areas here. Over the left right tuberosity is a stage III wound however most of this is reasonably superficial. Debrided of surface slough and the base of this actually appears fairly stable. There is a deeper probing area here that classifies this is a stage III however this does not get anywhere close to bone. On the other hand over the right ischial there is a deep probing wound that is probably exceedingly close to bone are periosteum. There is no palpable tenderness of either one of these sites. No drainage. The third area is a very unusual area on the lateral aspect of the left popliteal fossa. This probes superiorly Jacinto, [name]. ([id number]) more than I can measure  probably into the deep musculature of the thigh and initially I was concerned that this might communicate with the knee joint itself but I do not think that's the case. Knee exam is normal. There is some purulent appearing t drainage coming out of this that I cultured Electronic Signature(s) Signed: 09/30/2016 8:10:29 AM By: Baltazar Najjar MD Entered By: Baltazar Najjar on 09/21/2016 12:54:45 [name], [name] ([id number]) -------------------------------------------------------------------------------- Physician Orders Details Patient Name: [name] Date of Service: 09/21/2016 9:30 AM Medical Record Patient Account Number: [id number] Number: Treating RN: Phillis Haggis 30-Aug-1961 (55 y.o. Other Clinician: Date of Birth/Sex: Female) Treating ROBSON, MICHAEL Primary Care Physician/Extender: [name], [name] Physician: Referring Physician: Tedra Senegal in Treatment: 0 Verbal / Phone Orders: Yes ClinicianAshok Cordia, Debi Read Back and Verified: Yes Diagnosis Coding Wound Cleansing Wound #3 Right Gluteal fold o Clean wound with Normal Saline. Wound #4 Left Gluteal fold o Clean wound with Normal Saline. Wound #5 Posterior Knee o Clean wound with Normal Saline. Anesthetic Wound #3 Right Gluteal fold o Topical Lidocaine 4% cream applied to wound bed prior to debridement - clinic use only Wound #4 Left Gluteal fold o Topical Lidocaine 4% cream applied to wound bed prior to debridement - clinic use only Wound #5 Posterior Knee o Topical Lidocaine 4% cream applied to wound bed prior to debridement - clinic use only Skin Barriers/Peri-Wound Care Wound #3 Right Gluteal fold o Skin Prep Wound #4 Left Gluteal fold o Skin Prep Wound #5 Posterior Knee o Skin Prep Primary Wound Dressing Wound #3 Right Gluteal fold o Aquacel Ag - Silver Alginate Rope Farler, Takira [name]. ([id number]) Wound #4 Left Gluteal fold o Aquacel Ag - Silver Alginate Wound #5  Posterior Knee o Aquacel Ag - Silver Alginate Rope Secondary Dressing Wound #3 Right Gluteal fold o Dry Gauze o Boardered Foam Dressing Wound #4 Left Gluteal fold o Dry Gauze o Boardered Foam Dressing Wound #5 Posterior Knee o Dry Gauze o Boardered Foam Dressing Dressing Change Frequency Wound #3 Right Gluteal fold o Change dressing every other day. Wound #4 Left Gluteal fold o Change  dressing every other day. Wound #5 Posterior Knee o Change dressing every other day. Follow-up Appointments Wound #3 Right Gluteal fold o Return Appointment in 1 week. Wound #4 Left Gluteal fold o Return Appointment in 1 week. Wound #5 Posterior Knee o Return Appointment in 1 week. Off-Loading Wound #3 Right Gluteal fold o Turn and reposition every 2 hours Wound #4 Left Gluteal fold o Turn and reposition every 2 hours Wound #5 Posterior Knee [name], [name]. ([id number]) o Turn and reposition every 2 hours Home Health Wound #3 Right Gluteal fold o Continue Home Health Visits - [facility] Nurse may visit PRN to address patientos wound care needs. o FACE TO FACE ENCOUNTER: MEDICARE and MEDICAID PATIENTS: I certify that this patient is under my care and that I had a face-to-face encounter that meets the physician face-to-face encounter requirements with this patient on this date. The encounter with the patient was in whole or in part for the following MEDICAL CONDITION: (primary reason for Home Healthcare) MEDICAL NECESSITY: I certify, that based on my findings, NURSING services are a medically necessary home health service. HOME BOUND STATUS: I certify that my clinical findings support that this patient is homebound (i.e., Due to illness or injury, pt requires aid of supportive devices such as crutches, cane, wheelchairs, walkers, the use of special transportation or the assistance of another person to leave their place of  residence. There is a normal inability to leave the home and doing so requires considerable and taxing effort. Other absences are for medical reasons / religious services and are infrequent or of short duration when for other reasons). o If current dressing causes regression in wound condition, may D/C ordered dressing product/s and apply Normal Saline Moist Dressing daily until next Wound Healing Center / Other MD appointment. Notify Wound Healing Center of regression in wound condition at [phone]. o Please direct any NON-WOUND related issues/requests for orders to patient's Primary Care Physician Wound #4 Left Gluteal fold o Continue Home Health Visits - [facility] Nurse may visit PRN to address patientos wound care needs. o FACE TO FACE ENCOUNTER: MEDICARE and MEDICAID PATIENTS: I certify that this patient is under my care and that I had a face-to-face encounter that meets the physician face-to-face encounter requirements with this patient on this date. The encounter with the patient was in whole or in part for the following MEDICAL CONDITION: (primary reason for Home Healthcare) MEDICAL NECESSITY: I certify, that based on my findings, NURSING services are a medically necessary home health service. HOME BOUND STATUS: I certify that my clinical findings support that this patient is homebound (i.e., Due to illness or injury, pt requires aid of supportive devices such as crutches, cane, wheelchairs, walkers, the use of special transportation or the assistance of another person to leave their place of residence. There is a normal inability to leave the home and doing so requires considerable and taxing effort. Other absences are for medical reasons / religious services and are infrequent or of short duration when for other reasons). o If current dressing causes regression in wound condition, may D/C ordered dressing product/s and apply Normal  Saline Moist Dressing daily until next Wound Healing Center / Other MD appointment. Notify Wound Healing Center of regression in wound condition at [phone]. o Please direct any NON-WOUND related issues/requests for orders to patient's Primary Care Physician Wound #5 Posterior Knee o Continue Home Health Visits - [facility]/ [facility]  o Home Health Nurse may visit PRN to address patientos wound care needs. GHALIA, REICKS ([id number]) o FACE TO FACE ENCOUNTER: MEDICARE and MEDICAID PATIENTS: I certify that this patient is under my care and that I had a face-to-face encounter that meets the physician face-to-face encounter requirements with this patient on this date. The encounter with the patient was in whole or in part for the following MEDICAL CONDITION: (primary reason for Home Healthcare) MEDICAL NECESSITY: I certify, that based on my findings, NURSING services are a medically necessary home health service. HOME BOUND STATUS: I certify that my clinical findings support that this patient is homebound (i.e., Due to illness or injury, pt requires aid of supportive devices such as crutches, cane, wheelchairs, walkers, the use of special transportation or the assistance of another person to leave their place of residence. There is a normal inability to leave the home and doing so requires considerable and taxing effort. Other absences are for medical reasons / religious services and are infrequent or of short duration when for other reasons). o If current dressing causes regression in wound condition, may D/C ordered dressing product/s and apply Normal Saline Moist Dressing daily until next Wound Healing Center / Other MD appointment. Notify Wound Healing Center of regression in wound condition at [phone]. o Please direct any NON-WOUND related issues/requests for orders to patient's Primary Care Physician Medications-please add to medication list. Wound #3 Right Gluteal  fold o Other: - Vitamin C, Zinc, Multivitamin Wound #4 Left Gluteal fold o Other: - Vitamin C, Zinc, Multivitamin Wound #5 Posterior Knee o Other: - Vitamin C, Zinc, Multivitamin Devices o Bed- Mattress-Overlay or Replacement - [facility] to order o Gaffer - Museum/gallery conservator --- [facility] to order Laboratory o Bacteria identified in Wound by Culture (MICRO) - Left Posterior Knee oooo LOINC Code: 6462-6 oooo Convenience Name: Wound culture routine Radiology o Computed Tomography (CT) Scan - with and without contrast Left knee o X-ray, other - right and left ischium Electronic Signature(s) LUCIANNE, SMESTAD ([id number]) Signed: 09/21/2016 5:55:37 PM By: Alejandro Mulling Signed: 09/30/2016 8:10:29 AM By: Baltazar Najjar MD Entered By: Alejandro Mulling on 09/21/2016 12:49:28 Knipple, [name] ([id number]) -------------------------------------------------------------------------------- Problem List Details Patient Name: [name] Date of Service: 09/21/2016 9:30 AM Medical Record Patient Account Number: [id number] Number: Treating RN: Phillis Haggis 1961-07-20 (55 y.o. 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 Other Clinician: Date of Birth/Sex: Female) Treating ROBSON, MICHAEL Primary Care Physician/Extender: Leticia Penna Physician: Referring Physician: Tedra Senegal in Treatment: 0 Subjective Chief Complaint Information obtained from Patient Patient is at the clinic for treatment of an open pressure ulcer 2 bilateral gluteal area for about 3 months 09/21/16; patient repeat present Center clinic today with wounds over the  right ischial area left visual area and on the lateral aspect of the left popliteal fossa History of Present Illness (HPI) The following HPI elements were documented for the patient's wound: Location: bilateral gluteal ulcerations Quality: Patient reports experiencing a dull pain to affected area(s). Severity: Patient states wound are getting worse. Duration: Patient has had the wound for < 2 weeks prior to presenting for treatment Timing: Pain in wound is Intermittent (comes and goes Context: The wound occurred when the patient had a fall and was unconscious for about 48 hours laying on the floor and she had pressure injury at that stage. Modifying Factors: Other treatment(s) tried include:as noted below she has been seen by visiting wound care physicians or nurse practitioners and details have been noted Associated Signs and Symptoms: Patient reports having difficulty standing for long periods. 55 year old patient who was seen by visiting [name] wound care specialist for a wound on both her buttock and was found to have an unstageable wound on the right buttock for about 2 months. I understand that she had a fall and was laying on the floor for about 48 hours before she  was found and taken to the ICU and had a long injury to her gluteal area from pressure and also had broken her right humerus. She has had a right proximal humerus fracture and has been followed up with orthopedics recently. The patient has a past medical history of type 2 diabetes mellitus, paraparesis, acute pyelonephritis, GERD, hypertension, glaucoma, chronic pain, anxiety neurosis, nicotine dependence, COPD. the patient had some debridement done and was to operative was recommended to use Silvadene dressing and offloading. She is a smoker and occasionally smokes a few cigarettes. the patient requested a second opinion for months and is here to discuss her care. 09/21/16; the patient re-presents from home today for review of 3 different wounds. I note that she was seen in the clinic here in July at which time she had bilateral buttock wounds. It was apparently suggested at that time that she use a wound VAC bridged to both wounds just near the initial tuberosity's bilaterally which she refused. Stevison, Valeria [name]. ([id number]) The history was a bit difficult to put together. Apparently this patient became ill at the end of April of this year. She was found sitting on the floor she had apparently been for 2 days and subsequently admitted to hospital from 04/23/16 through 05/02/16 and at that point she was critically ill ultimately having sepsis secondary to UTI, nontraumatic rhabdomyolysis and diabetic ketoacidosis. She had acute renal failure and I think required ICU care including intubation. Patient states her wounds actually started at that point on the bilateral issue tuberosities however in reviewing the discharge summary from 5/8 I see no reference to wounds at that point. It did state that she had left lower extremity cellulitis however. Reviewing Epic I see no relevant x-rays. It would appear that her discharge creatinine was within the normal range and indeed on 9/15 her creatinine has  remained normal. [facility] There the wounds on her bilateral Botox were dressed. Only just before her discharge from the nursing facility she developed an "knot" which was interpreted as cellulitis on the posterior aspect of her left knee she was given antibiotics. Apparently sometime late in July a this actually opened and became a wound at home health care was tending to however she is still having purulent drainage coming from this and by my understanding the  wound depth is actually become unmeasurable. I am not really clear about what home health has been placing in any of these wound areas. The patient states that is something with silver and it. She is not been systemically unwell no fever or chills her appetite is good. She is a diabetic poorly controlled however she states that her recent blood sugars at home have been in the low to mid 100s. Wound History Patient presents with 3 open wounds that have been present for approximately since April 2017. Patient has been treating wounds in the following manner: silver. Laboratory tests have not been performed in the last month. Patient reportedly has not tested positive for an antibiotic resistant organism. Patient reportedly has not tested positive for osteomyelitis. Patient reportedly has not had testing performed to evaluate circulation in the legs. Patient experiences the following problems associated with their wounds: swelling. Patient History Information obtained from Patient. Allergies Biaxin Family History Cancer - Mother, Diabetes - Father, Hypertension - Mother, Father, Stroke - Mother, No family history of Heart Disease, Kidney Disease, Lung Disease, Seizures, Thyroid Problems. Social History Former smoker, Marital Status - Divorced, Alcohol Use - Never, Drug Use - Prior History, Caffeine Use - Rarely. Medical History Hospitalization/Surgery History - 04/09/2016,  [facility], [facility]. Hessling, Maguadalupe [name]. ([id number]) Objective Constitutional Sitting or standing Blood Pressure is within target range for patient.. Pulse regular and within target range for patient.Marland Kitchen. Respirations regular, non-labored and within target range.. Temperature is normal and within the target range for the patient.. Patient is in no distress. Vitals Time Taken: 9:11 AM, Height: 63 in, Source: Stated, Weight: 257 lbs, Source: Stated, BMI: 45.5, Temperature: 98.2 F, Pulse: 76 bpm, Respiratory Rate: 18 breaths/min, Blood Pressure: 128/66 mmHg. Eyes Conjunctivae clear. No discharge.Marland Kitchen. Respiratory Respiratory effort is easy and symmetric bilaterally. Rate is normal at rest and on room air.. Bilateral breath sounds are clear and equal in all lobes with no wheezes, rales or rhonchi.. Cardiovascular Heart rhythm and rate regular, without murmur or gallop.. Pedal pulses palpable and strong bilaterally.. Gastrointestinal (GI) Abdomen is soft and non-distended without masses or tenderness. Bowel sounds active in all quadrants.. No liver or spleen enlargement or tenderness.. Genitourinary (GU) Bladder without fullness, masses or tenderness.. Lymphatic None palpable in the inguinal area. Musculoskeletal Careful examination of the left knee revealed no effusion no warmth no instability and no tenderness. Neurological Although the patient complains of neuropathic symptoms her exam to vibration sense was normal. Psychiatric No evidence of depression, anxiety, or agitation. Calm, cooperative, and communicative. Appropriate interactions and affect.. General Notes: Wound exam; there are actually 3 concerning areas here. Over the left right tuberosity is a stage III wound however most of this is reasonably superficial. Debrided of surface slough and the base of this actually appears fairly stable. There is a deeper probing area here that classifies this is a stage III however this does not get anywhere  close to bone. On the other hand over the right ischial there is a deep probing wound that is probably exceedingly close to bone are periosteum. There is no palpable tenderness Piggott, Briggitte [name]. ([id number]) of either one of these sites. No drainage. The third area is a very unusual area on the lateral aspect of the left popliteal fossa. This probes superiorly more than I can measure probably into the deep musculature of the thigh and initially I was concerned that this might communicate with the knee joint itself but I do not think that's the case. Knee exam is  normal. There is some purulent appearing t drainage coming out of this that I cultured Integumentary (Hair, Skin) Wound #3 status is Open. Original cause of wound was Pressure Injury. The wound is located on the Right Gluteal fold. The wound measures 2.3cm length x 1.7cm width x 5.6cm depth; 3.071cm^2 area and 17.197cm^3 volume. There is muscle, fat, and fascia exposed. There is no tunneling or undermining noted. There is a large amount of serous drainage noted. The wound margin is distinct with the outline attached to the wound base. There is medium (34-66%) pink granulation within the wound bed. There is a medium (34- 66%) amount of necrotic tissue within the wound bed including Adherent Slough. The periwound skin appearance exhibited: Maceration, Moist. Periwound temperature was noted as No Abnormality. The periwound has tenderness on palpation. Wound #4 status is Open. Original cause of wound was Pressure Injury. The wound is located on the Left Gluteal fold. The wound measures 1.9cm length x 5.2cm width x 1.9cm depth; 7.76cm^2 area and 14.743cm^3 volume. There is muscle and fat exposed. There is no tunneling or undermining noted. There is a large amount of serous drainage noted. The wound margin is distinct with the outline attached to the wound base. There is medium (34-66%) red, pink granulation within the wound bed. There is a medium  (34- 66%) amount of necrotic tissue within the wound bed including Adherent Slough. The periwound skin appearance exhibited: Maceration, Moist. Periwound temperature was noted as No Abnormality. The periwound has tenderness on palpation. Wound #5 status is Open. Original cause of wound was Gradually Appeared. The wound is located on the Posterior Knee. The wound measures 0.9cm length x 0.7cm width x 15cm depth; 0.495cm^2 area and 7.422cm^3 volume. There is muscle and fat exposed. There is no tunneling or undermining noted. There is a large amount of purulent drainage noted. The wound margin is flat and intact. There is large (67-100%) pink granulation within the wound bed. There is a small (1-33%) amount of necrotic tissue within the wound bed including Eschar and Adherent Slough. The periwound skin appearance exhibited: Localized Edema, Maceration, Moist, Rubor, Erythema. The periwound skin appearance did not exhibit: Callus, Crepitus, Excoriation, Fluctuance, Friable, Induration, Rash, Scarring, Dry/Scaly, Atrophie Blanche, Cyanosis, Ecchymosis, Hemosiderin Staining, Mottled, Pallor. The surrounding wound skin color is noted with erythema which is circumferential. Periwound temperature was noted as Hot. The periwound has tenderness on palpation. 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 ([id number]) Procedures Wound #4 Wound #4 is a Pressure Ulcer located on the Left Gluteal fold . There was a Skin/Subcutaneous Tissue Debridement ([id number]) debridement with total area of 9.88 sq cm performed by Maxwell Caul, MD. with the following instrument(s): scoop to remove Viable and Non-Viable tissue/material including Exudate, Fibrin/Slough, and  Subcutaneous after achieving pain control using Lidocaine 4% Topical Solution. A time out was conducted at 10:12, prior to the start of the procedure. A Minimum amount of bleeding was controlled with Pressure. The procedure was tolerated well with a pain level of 0 throughout and a pain level of 0 following the procedure. Post Debridement Measurements: 1.9cm length x 5.2cm width x 2cm depth; 15.519cm^3 volume. Post debridement Stage noted as Category/Stage IV. Character of Wound/Ulcer Post Debridement is stable. Severity of Tissue Post Debridement is: Fat layer exposed.  Post procedure Diagnosis Wound #4: Same as Pre-Procedure Plan Wound Cleansing: Wound #3 Right Gluteal fold: Clean wound with Normal Saline. Wound #4 Left Gluteal fold: Clean wound with Normal Saline. Wound #5 Posterior Knee: Clean wound with Normal Saline. Anesthetic: Wound #3 Right Gluteal fold: Topical Lidocaine 4% cream applied to wound bed prior to debridement - clinic use only Wound #4 Left Gluteal fold: Topical Lidocaine 4% cream applied to wound bed prior to debridement - clinic use only Wound #5 Posterior Knee: Topical Lidocaine 4% cream applied to wound bed prior to debridement - clinic use only Skin Barriers/Peri-Wound Care: Wound #3 Right Gluteal fold: Skin Prep Wound #4 Left Gluteal fold: Skin Prep Wound #5 Posterior Knee: Skin Prep Primary Wound Dressing: Creedon, Aslynn [name]. ([id number]) Wound #3 Right Gluteal fold: Aquacel Ag - Silver Alginate Rope Wound #4 Left Gluteal fold: Aquacel Ag - Silver Alginate Wound #5 Posterior Knee: Aquacel Ag - Silver Alginate Rope Secondary Dressing: Wound #3 Right Gluteal fold: Dry Gauze Boardered Foam Dressing Wound #4 Left Gluteal fold: Dry Gauze Boardered Foam Dressing Wound #5 Posterior Knee: Dry Gauze Boardered Foam Dressing Dressing Change Frequency: Wound #3 Right Gluteal fold: Change dressing every other day. Wound #4 Left Gluteal fold: Change  dressing every other day. Wound #5 Posterior Knee: Change dressing every other day. Follow-up Appointments: Wound #3 Right Gluteal fold: Return Appointment in 1 week. Wound #4 Left Gluteal fold: Return Appointment in 1 week. Wound #5 Posterior Knee: Return Appointment in 1 week. Off-Loading: Wound #3 Right Gluteal fold: Turn and reposition every 2 hours Wound #4 Left Gluteal fold: Turn and reposition every 2 hours Wound #5 Posterior Knee: Turn and reposition every 2 hours Home Health: Wound #3 Right Gluteal fold: Continue Home Health Visits - [facility] Nurse may visit PRN to address patient s wound care needs. FACE TO FACE ENCOUNTER: MEDICARE and MEDICAID PATIENTS: I certify that this patient is under my care and that I had a face-to-face encounter that meets the physician face-to-face encounter requirements with this patient on this date. The encounter with the patient was in whole or in part for the following MEDICAL CONDITION: (primary reason for Home Healthcare) MEDICAL NECESSITY: I certify, that based on my findings, NURSING services are a medically necessary home health service. HOME BOUND STATUS: I certify that my clinical findings support that this patient is homebound (i.e., Due to illness or injury, pt requires aid of supportive devices such as crutches, cane, wheelchairs, walkers, the use of special transportation or the assistance of another person to leave their place of residence. There is a normal inability to leave the home and doing so requires considerable and taxing effort. Other absences are for medical reasons / religious services and are infrequent or of short duration when for other reasons). Lodes, [name]. ([id number]) If current dressing causes regression in wound condition, may D/C ordered dressing product/s and apply Normal Saline Moist Dressing daily until next Wound Healing Center / Other MD appointment. Notify Wound Healing  Center of regression in wound condition at [phone]. Please direct any NON-WOUND related issues/requests for orders to patient's Primary Care Physician Wound #4 Left Gluteal fold: Continue Home Health Visits - [facility] Nurse may visit PRN to address patient s wound care needs. FACE TO FACE ENCOUNTER: MEDICARE and MEDICAID PATIENTS: I certify that this patient is under my care and that I had a face-to-face encounter that meets the physician face-to-face encounter requirements with this patient  on this date. The encounter with the patient was in whole or in part for the following MEDICAL CONDITION: (primary reason for Home Healthcare) MEDICAL NECESSITY: I certify, that based on my findings, NURSING services are a medically necessary home health service. HOME BOUND STATUS: I certify that my clinical findings support that this patient is homebound (i.e., Due to illness or injury, pt requires aid of supportive devices such as crutches, cane, wheelchairs, walkers, the use of special transportation or the assistance of another person to leave their place of residence. There is a normal inability to leave the home and doing so requires considerable and taxing effort. Other absences are for medical reasons / religious services and are infrequent or of short duration when for other reasons). If current dressing causes regression in wound condition, may D/C ordered dressing product/s and apply Normal Saline Moist Dressing daily until next Wound Healing Center / Other MD appointment. Notify Wound Healing Center of regression in wound condition at [phone]. Please direct any NON-WOUND related issues/requests for orders to patient's Primary Care Physician Wound #5 Posterior Knee: Continue Home Health Visits - [facility]/ [facility] Nurse may visit PRN to address patient s wound care needs. FACE TO FACE ENCOUNTER: MEDICARE and MEDICAID PATIENTS: I certify that  this patient is under my care and that I had a face-to-face encounter that meets the physician face-to-face encounter requirements with this patient on this date. The encounter with the patient was in whole or in part for the following MEDICAL CONDITION: (primary reason for Home Healthcare) MEDICAL NECESSITY: I certify, that based on my findings, NURSING services are a medically necessary home health service. HOME BOUND STATUS: I certify that my clinical findings support that this patient is homebound (i.e., Due to illness or injury, pt requires aid of supportive devices such as crutches, cane, wheelchairs, walkers, the use of special transportation or the assistance of another person to leave their place of residence. There is a normal inability to leave the home and doing so requires considerable and taxing effort. Other absences are for medical reasons / religious services and are infrequent or of short duration when for other reasons). If current dressing causes regression in wound condition, may D/C ordered dressing product/s and apply Normal Saline Moist Dressing daily until next Wound Healing Center / Other MD appointment. Notify Wound Healing Center of regression in wound condition at [phone]. Please direct any NON-WOUND related issues/requests for orders to patient's Primary Care Physician Medications-please add to medication list.: Wound #3 Right Gluteal fold: Other: - Vitamin C, Zinc, Multivitamin Wound #4 Left Gluteal fold: Other: - Vitamin C, Zinc, Multivitamin Wound #5 Posterior Knee: Other: - Vitamin C, Zinc, Multivitamin Laboratory ordered were: Wound culture routine - Left Posterior Knee Radiology ordered were: Computed Tomography (CT) Scan - with and without contrast Left knee, X-ray, other - right and left ischium Devices ordered were: Bed- Mattress-Overlay or Replacement - [facility] to order, Gaffer - [employer] --- [place], [name]  ([id number]) [facility] to order #1 we applied silver alginate covered to the wound over the left ischial tuberosity. Silver alginate rope to the area over the right initial tuberosity and the unusual wound in his left popliteal fossa. #2 I would agree that the wound VAC to the right ischial wound which is a stage IV area would be the treatment of choice although the patient is reluctant to agree with this. She had already refused this one previously recommended by Dr.  Britto in July #3 I think the patient needs to have for lower thigh musculature imaged to exclude an underlying abscess that is draining through the opening in the left lateral popliteal fossa. This was not previously seen in July when Dr. Meyer Russel reviewed her A culture of this area was done however for now I didn't think there was need for antibiotics to be prescribed. There is no surrounding tenderness crepitus etc. the patient looks systemically well. 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 She has not had a recent hemoglobin A1c as she has not seen her primary physician since leaving the nursing home Electronic Signature(s) Signed: 09/30/2016 8:10:29 AM By: Baltazar Najjar MD Entered By: Baltazar Najjar on 09/21/2016 12:59:59 Oliva, [name] ([id number]) -------------------------------------------------------------------------------- ROS/PFSH Details Patient Name: [name] Date of Service: 09/21/2016 9:30 AM Medical Record Patient Account Number:  [id number] Number: Treating RN: Phillis Haggis 13-Oct-1961 (55 y.o. Other Clinician: Date of Birth/Sex: Female) Treating ROBSON, MICHAEL Primary Care Physician/Extender: Leticia Penna Physician: Referring Physician: Tedra Sen[place] in Treatment: 0 Information Obtained From Patient Wound History Do you currently have one or more open woundso Yes How many open wounds do you currently haveo 3 Approximately how long have you had your woundso since April 2017 How have you been treating your wound(s) until nowo silver Has your wound(s) ever healed and then re-openedo No Have you had any lab work done in the past montho No Have you tested positive for an antibiotic resistant organism (MRSA, VRE)o No Have you tested positive for osteomyelitis (bone infection)o No Have you had any tests for circulation on your legso No Have you had other problems associated with your woundso Swelling Eyes Medical History: Negative for: Cataracts; Glaucoma; Optic Neuritis Ear/Nose/Mouth/Throat Medical History: Negative for: Chronic sinus problems/congestion; Middle ear problems Hematologic/Lymphatic Medical History: Negative for: Anemia; Hemophilia; Human Immunodeficiency Virus; Lymphedema; Sickle Cell Disease Respiratory Medical History: Positive for: Asthma Negative for: Aspiration; Chronic Obstructive Pulmonary Disease (COPD); Pneumothorax; Sleep Apnea; Tuberculosis Cardiovascular Crockett, Kamaryn [name]. ([id number]) Medical History: Positive for: Hypertension Negative for: Angina; Arrhythmia; Congestive Heart Failure; Coronary Artery Disease; Deep Vein Thrombosis; Hypotension; Myocardial Infarction; Peripheral Arterial Disease; Peripheral Venous Disease; Phlebitis; Vasculitis Gastrointestinal Medical History: Negative for: Cirrhosis ; Colitis; Crohnos; Hepatitis A; Hepatitis B; Hepatitis C Endocrine Medical History: Positive for: Type II Diabetes Negative for: Type I Diabetes Time  with diabetes: 30 yrs Treated with: Insulin Blood sugar tested every day: Yes Tested : Blood sugar testing results: Breakfast: 121 Genitourinary Medical History: Negative for: End Stage Renal Disease Immunological Medical History: Negative for: Lupus Erythematosus; Raynaudos; Scleroderma Integumentary (Skin) Medical History: Negative for: History of Burn; History of pressure wounds Musculoskeletal Medical History: Negative for: Gout; Rheumatoid Arthritis; Osteoarthritis; Osteomyelitis Neurologic Medical History: Positive for: Neuropathy Negative for: Dementia; Quadriplegia; Paraplegia; Seizure Disorder Oncologic AVENA, HILLIER ([id number]) Medical History: Negative for: Received Chemotherapy; Received Radiation Psychiatric Medical History: Negative for: Anorexia/bulimia; Confinement Anxiety Hospitalization / Surgery History Name of Hospital Purpose of Hospitalization/Surgery Date [facility] AMS 04/09/2016 Family  and Social History Cancer: Yes - Mother; Diabetes: Yes - Father; Heart Disease: No; Hypertension: Yes - Mother, Father; Kidney Disease: No; Lung Disease: No; Seizures: No; Stroke: Yes - Mother; Thyroid Problems: No; Former smoker; Marital Status - Divorced; Alcohol Use: Never; Drug Use: Prior History; Caffeine Use: Rarely; Advanced Directives: No; Patient does not want information on Advanced Directives; Living Will: No; Medical Power of Attorney: No Electronic Signature(s) Signed: 09/21/2016 5:55:37 PM By: Alejandro Mulling Signed: 09/30/2016 8:10:29 AM By: Baltazar Najjar MD Entered By: Alejandro Mulling on 09/21/2016 09:46:56 Darin, [name] ([id number]) -------------------------------------------------------------------------------- SuperBill Details Patient Name: [name] Date of Service: 09/21/2016 Medical Record Patient Account Number: [id number] Number: Treating RN: Phillis Haggis 22-Dec-1961 (55 y.o. 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 Facility Procedures CPT4 Code: 86578469 Description: 99213 - WOUND CARE VISIT-LEV 3 EST PT Modifier: Quantity: 1 CPT4 Code: [id number] Description: 11042 - DEB SUBQ TISSUE 20 SQ CM/< ICD-10 Description Diagnosis L89.323 Pressure ulcer of left buttock, stage 3 Modifier: Quantity: 1 Physician Procedures CPT4 Code Description: [id number] 27253 - WC PHYS LEVEL 5 - EST PT ICD-10 Description Diagnosis L97.129 Non-pressure chronic ulcer of left thigh with unspeci Modifier: 25 fied severit Quantity: 1 y CPT4 Code Description: [id number] 11042 - WC PHYS SUBQ TISS 20 SQ CM ICD-10 Description Diagnosis L89.323 Pressure ulcer of left buttock, stage 3 Modifier: Quantity: 1 Electronic Signature(s) Signed: 09/21/2016 5:55:37 PM By: Alejandro Mulling Signed: 09/30/2016 8:10:29 AM By: Baltazar Najjar MD [place], [name] ([id number]) Entered By: Alejandro Mulling on 09/21/2016 13:26:16

## 2016-09-30 NOTE — Progress Notes (Signed)
Katelyn, [name] ([id number]) Visit Report for 09/21/2016 Allergy List Details Patient Name: [name], [name] Date of Service: 09/21/2016 9:30 AM Medical Record Patient Account Number: [id number] Number: Treating RN: Ahmed Prima July 14, 1961 (55 y.o. Other Clinician: Date of Birth/Sex: Female) Treating ROBSON, MICHAEL Primary Care Physician: Lavera Guise Physician/Extender: G Referring Physician: Sallee Lange in Treatment: 0 Allergies Active Allergies Biaxin Allergy Notes Electronic Signature(s) Signed: 09/21/2016 5:55:37 PM By: Alric Quan Entered By: Alric Quan on 09/21/2016 09:13:50 [place], [name] ([id number]) -------------------------------------------------------------------------------- Arrival Information Details Patient Name: [name] Date of Service: 09/21/2016 9:30 AM Medical Record Patient Account Number: [id number] Number: Treating RN: Ahmed Prima 10-09-1961 (55 y.o. Other Clinician: Date of Birth/Sex: Female) Treating ROBSON, MICHAEL Primary Care Physician: Lavera Guise Physician/Extender: G Referring Physician: Sallee Lange in Treatment: 0 Visit Information Patient Arrived: Wheel Chair Arrival Time: 09:11 Accompanied By: mother Transfer Assistance: EasyPivot Patient Lift Patient Identification Verified: Yes Secondary Verification Process Yes Completed: Patient Requires Transmission- No Based Precautions: Patient Has Alerts: Yes Patient Alerts: DM II History Since Last Visit All ordered tests and consults were completed: No Added or deleted any medications: Yes Any new allergies or adverse reactions: No Had a fall or experienced change in activities of daily living that may affect risk of falls: No Signs or symptoms of abuse/neglect since last visito No Hospitalized since last visit: No Electronic Signature(s) Signed: 09/21/2016 5:55:37 PM By: Alric Quan Entered By: Alric Quan on 09/21/2016  [place], [name] ([id number]) -------------------------------------------------------------------------------- Clinic Level of Care Assessment Details Patient Name: [name] Date of Service: 09/21/2016 9:30 AM Medical Record Patient Account Number: [id number] Number: Treating RN: Ahmed Prima May 10, 1961 (55 y.o. Other Clinician: Date of Birth/Sex: Female) Treating ROBSON, MICHAEL Primary Care Physician: Lavera Guise Physician/Extender: G Referring Physician: Sallee Lange in Treatment: 0 Clinic Level of Care Assessment Items TOOL 1 Quantity Score X - Use when EandM and Procedure is performed on INITIAL visit 1 0 ASSESSMENTS - Nursing Assessment / Reassessment X - General Physical Exam (combine w/ comprehensive assessment (listed just 1 20 below) when performed on new pt. evals) X - Comprehensive Assessment (HX, ROS, Risk Assessments, Wounds Hx, etc.) 1 25 ASSESSMENTS - Wound and Skin Assessment / Reassessment _0  - Dermatologic / Skin Assessment (not related to wound area) 0 ASSESSMENTS - Ostomy and/or Continence Assessment and Care _1  - Incontinence Assessment and Management 0 _2  - Ostomy Care Assessment and Management (repouching, etc.) 0 PROCESS - Coordination of Care _3  - Simple Patient / Family Education for ongoing care 0 X - Complex (extensive) Patient / Family Education for ongoing care 1 20 X - Staff obtains Programmer, systems, Records, Test Results / Process Orders 1 10 X - Staff telephones HHA, Nursing Homes / Clarify orders / etc 1 10 _4  - Routine Transfer to another Facility (non-emergent condition) 0 _5  - Routine Hospital Admission (non-emergent condition) 0 X - New Admissions / Biomedical engineer / Ordering NPWT, Apligraf, etc. 1 15 _6  - Emergency Hospital Admission (emergent condition) 0 PROCESS - Special Needs _7  - Pediatric / Minor Patient Management 0 Cabiness, Sierah L. ([id number]) _8  - Isolation Patient Management 0 _9  - Hearing /  Language / Visual special needs 0 _10  - Assessment of Community assistance (transportation, D/C planning, etc.) 0 _11  - Additional assistance / Altered mentation 0 X - Support Surface(s) Assessment (bed, cushion, seat, etc.) 1 15 INTERVENTIONS - Miscellaneous _12  - External ear exam 0 _13  - Patient Transfer (multiple staff / Harrel Lemon  Lift / Similar devices) 0 _0  - Simple Staple / Suture removal (25 or less) 0 _1  - Complex Staple / Suture removal (26 or more) 0 _2  - Hypo/Hyperglycemic Management (do not check if billed separately) 0 _3  - Ankle / Brachial Index (ABI) - do not check if billed separately 0 Has the patient been seen at the hospital within the last three years: Yes Total Score: 115 Level Of Care: New/Established - Level 3 Electronic Signature(s) Signed: 09/21/2016 5:55:37 PM By: Alric Quan Entered By: Alric Quan on 09/21/2016 11:59:59 [place], [name] ([id number]) -------------------------------------------------------------------------------- Encounter Discharge Information Details Patient Name: [name] Date of Service: 09/21/2016 9:30 AM Medical Record Patient Account Number: [id number] Number: Treating RN: Ahmed Prima 05/18/1961 (55 y.o. Other Clinician: Date of Birth/Sex: Female) Treating ROBSON, MICHAEL Primary Care Physician: Lavera Guise Physician/Extender: G Referring Physician: Sallee Lange in Treatment: 0 Encounter Discharge Information Items Discharge Pain Level: 0 Discharge Condition: Stable Ambulatory Status: Wheelchair Discharge Destination: Home Transportation: Private Auto Accompanied By: mother Schedule Follow-up Appointment: Yes Medication Reconciliation completed and provided to Patient/Care No Kayci [place]: Provided on Clinical Summary of Care: 09/21/2016 Form Type Recipient Paper Patient TW Electronic Signature(s) Signed: 09/21/2016 10:51:16 AM By: Ruthine Dose Entered By: Ruthine Dose on 09/21/2016  10:51:16 [place], [name] ([id number]) -------------------------------------------------------------------------------- Lower Extremity Assessment Details Patient Name: [name] Date of Service: 09/21/2016 9:30 AM Medical Record Patient Account Number: [id number] Number: Treating RN: Ahmed Prima 07/24/1961 (55 y.o. Other Clinician: Date of Birth/Sex: Female) Treating ROBSON, MICHAEL Primary Care Physician: Lavera Guise Physician/Extender: G Referring Physician: Sallee Lange in Treatment: 0 Vascular Assessment Pulses: Posterior Tibial Dorsalis Pedis Palpable: [Left:Yes] [Right:Yes] Extremity colors, hair growth, and conditions: Extremity Color: [Left:Red] [Right:Normal] Temperature of Extremity: [Left:Warm] [Right:Warm] Capillary Refill: [Left:< 3 seconds] [Right:< 3 seconds] Toe Nail Assessment Left: Right: Thick: No No Discolored: No No Deformed: No No Improper Length and Hygiene: Yes Yes Electronic Signature(s) Signed: 09/21/2016 5:55:37 PM By: Alric Quan Entered By: Alric Quan on 09/21/2016 10:08:04 [place], Cadie Carlean Jews ([id number]) -------------------------------------------------------------------------------- Multi Wound Chart Details Patient Name: [name] Date of Service: 09/21/2016 9:30 AM Medical Record Patient Account Number: [id number] Number: Treating RN: Ahmed Prima [birth year] (55 y.o. Other Clinician: Date of Birth/Sex: Female) Treating ROBSON, MICHAEL Primary Care Physician: Lavera Guise Physician/Extender: G Referring Physician: Sallee Lange in Treatment: 0 Vital Signs Height(in): 63 Pulse(bpm): 76 Weight(lbs): 257 Blood Pressure 128/66 (mmHg): Body Mass Index(BMI): 46 Temperature(F): 98.2 Respiratory Rate 18 (breaths/min): Photos: [3:No Photos] [4:No Photos] [5:No Photos] Wound Location: [3:Right Gluteal fold] [4:Left Gluteal fold] [5:Knee - Posterior] Wounding Event: [3:Pressure Injury]  [4:Pressure Injury] [5:Gradually Appeared] Primary Etiology: [3:Pressure Ulcer] [4:Pressure Ulcer] [5:To be determined] Secondary Etiology: [3:N/A] [4:N/A] [5:Diabetic Wound/Ulcer of the Lower Extremity] Comorbid History: [3:N/A] [4:Asthma, Hypertension, Type II Diabetes, Neuropathy] [5:N/A] Date Acquired: [3:04/21/2016] [4:04/21/2016] [5:07/15/2016] Weeks of Treatment: [3:0] [4:0] [5:0] Wound Status: [3:Open] [4:Open] [5:Open] Measurements L x W x D 2.3x1.7x5.6 [4:1.9x5.2x1.9] [5:0.9x0.7x15] (cm) Area (cm) : [3:3.071] [4:7.76] [5:0.495] Volume (cm) : [3:17.197] [4:14.743] [5:7.422] % Reduction in Area: [3:N/A] [4:0.00%] [5:0.00%] % Reduction in Volume: N/A [4:0.00%] [5:0.00%] Classification: [3:Category/Stage IV] [4:Category/Stage IV] [5:Full Thickness With Exposed Support Structures] Exudate Amount: [3:Large] [4:Large] [5:Large] Exudate Type: [3:Serous] [4:Serous] [5:Purulent] Exudate Color: [3:amber] [4:amber] [5:yellow, brown, green] Wound Margin: [3:Distinct, outline attached Distinct, outline attached Flat and Intact] Granulation Amount: [3:Medium (34-66%)] [4:Medium (34-66%)] [5:Large (67-100%)] Granulation Quality: [3:Pink] [4:Red, Pink] [5:Pink] Necrotic Amount: [3:Medium (34-66%)] [4:Medium (34-66%)] [5:Small (1-33%)] Necrotic Tissue: Adherent [facility]  Adherent [place] Exposed Structures: Fascia: Yes Fat: Yes Fat: Yes Fat: Yes Muscle: Yes Muscle: Yes Muscle: Yes Fascia: No Fascia: No Tendon: No Tendon: No Tendon: No Joint: No Joint: No Bone: No Bone: No Epithelialization: None None None Periwound Skin Texture: No Abnormalities Noted No Abnormalities Noted Edema: Yes Excoriation: No Induration: No Callus: No Crepitus: No Fluctuance: No Friable: No Rash: No Scarring: No Periwound Skin Maceration: Yes Maceration: Yes Maceration: Yes Moisture: Moist: Yes Moist: Yes Moist: Yes Dry/Scaly: No Periwound Skin Color: No Abnormalities  Noted No Abnormalities Noted Erythema: Yes Rubor: Yes Atrophie Blanche: No Cyanosis: No Ecchymosis: No Hemosiderin Staining: No Mottled: No Pallor: No Erythema Location: N/A N/A Circumferential Temperature: No Abnormality No Abnormality Hot Tenderness on Yes Yes Yes Palpation: Wound Preparation: Ulcer Cleansing: Ulcer Cleansing: Ulcer Cleansing: Rinsed/Irrigated with Rinsed/Irrigated with Rinsed/Irrigated with Saline Saline Saline Topical Anesthetic Topical Anesthetic Topical Anesthetic Applied: Other: lidocaine Applied: Other: lidocaine Applied: Other: lidocaine 4% 4% 4% Treatment Notes Electronic Signature(s) Signed: 09/21/2016 5:55:37 PM By: Alric Quan Entered By: Alric Quan on 09/21/2016 10:12:33 [place], [name] ([id number]) -------------------------------------------------------------------------------- [place] Details Patient Name: [name] Date of Service: 09/21/2016 9:30 AM Medical Record Patient Account Number: [id number] Number: Treating RN: Ahmed Prima Apr 16, 1961 (55 y.o. Other Clinician: Date of Birth/Sex: Female) Treating ROBSON, MICHAEL Primary Care Physician: Lavera Guise Physician/Extender: G Referring Physician: Sallee Lange in Treatment: 0 Active Inactive Abuse / Safety / Falls / Self Care Management Nursing Diagnoses: Potential for falls Goals: Patient will remain injury free Date Initiated: 09/21/2016 Goal Status: Active Interventions: Assess fall risk on admission and as needed Notes: Nutrition Nursing Diagnoses: Imbalanced nutrition Potential for alteratiion in Nutrition/Potential for imbalanced nutrition Goals: Patient/caregiver agrees to and verbalizes understanding of need to use nutritional supplements and/or vitamins as prescribed Date Initiated: 09/21/2016 Goal Status: Active Patient/caregiver verbalizes understanding of need to maintain therapeutic glucose control per primary  care physician Date Initiated: 09/21/2016 Goal Status: Active Interventions: Assess patient nutrition upon admission and as needed per policy Notes: [place], [place] ([id number]) Orientation to the Wound Care Program Nursing Diagnoses: Knowledge deficit related to the wound healing center program Goals: Patient/caregiver will verbalize understanding of the [place] Date Initiated: 09/21/2016 Goal Status: Active Interventions: Provide education on orientation to the wound center Notes: Pain, Acute or Chronic Nursing Diagnoses: Pain, acute or chronic: actual or potential Potential alteration in comfort, pain Goals: Patient will verbalize adequate pain control and receive pain control interventions during procedures as needed Date Initiated: 09/21/2016 Goal Status: Active Patient/caregiver will verbalize adequate pain control between visits Date Initiated: 09/21/2016 Goal Status: Active Patient/caregiver will verbalize comfort level met Date Initiated: 09/21/2016 Goal Status: Active Interventions: Assess comfort goal upon admission Complete pain assessment as per visit requirements Notes: Wound/Skin Impairment Nursing Diagnoses: Impaired tissue integrity Goals: Ulcer/skin breakdown will have a volume reduction of 30% by week 4 Date Initiated: 09/21/2016 ALBENA, COMES ([id number]) Goal Status: Active Ulcer/skin breakdown will have a volume reduction of 50% by week 8 Date Initiated: 09/21/2016 Goal Status: Active Ulcer/skin breakdown will have a volume reduction of 80% by week 12 Date Initiated: 09/21/2016 Goal Status: Active Interventions: Assess ulceration(s) every visit Notes: Electronic Signature(s) Signed: 09/21/2016 5:55:37 PM By: Alric Quan Entered By: Alric Quan on 09/21/2016 10:12:13 [place], [name] ([id number]) -------------------------------------------------------------------------------- Pain Assessment Details Patient Name: [name] Date of Service: 09/21/2016 9:30 AM Medical Record Patient Account Number: [id number] Number: Treating RN: Ahmed Prima 08-14-1961 (  55 y.o. Other Clinician: Date of Birth/Sex: Female) Treating ROBSON, MICHAEL Primary Care Physician: Lavera Guise Physician/Extender: G Referring Physician: Sallee Lange in Treatment: 0 Active Problems Location of Pain Severity and Description of Pain Patient Has Paino No Site Locations With Dressing Change: No Pain Management and Medication Current Pain Management: Electronic Signature(s) Signed: 09/21/2016 5:55:37 PM By: Alric Quan Entered By: Alric Quan on 09/21/2016 09:11:45 [place], [name] ([id number]) -------------------------------------------------------------------------------- Patient/Caregiver Education Details Patient Name: [name] Date of Service: 09/21/2016 9:30 AM Medical Record Patient Account Number: [id number] Number: Treating RN: Ahmed Prima June 03, 1961 (56 y.o. Other Clinician: Date of Birth/Gender: Female) Treating ROBSON, MICHAEL Primary Care Physician: Lavera Guise Physician/Extender: G Referring Physician: Sallee Lange in Treatment: 0 Education Assessment Education Provided To: Patient Education Topics Provided Welcome To [facility]: Handouts: Welcome To [facility] Methods: Explain/Verbal Responses: State content correctly Wound/Skin Impairment: Handouts: Other: change dressing as ordered Methods: Demonstration, Explain/Verbal Responses: State content correctly Electronic Signature(s) Signed: 09/21/2016 5:55:37 PM By: Alric Quan Entered By: Alric Quan on 09/21/2016 10:25:13 [place], [name] ([id number]) -------------------------------------------------------------------------------- Wound Assessment Details Patient Name: [name] Date of Service: 09/21/2016 9:30 AM Medical Record Patient Account Number:  [id number] Number: Treating RN: Ahmed Prima Oct 18, 1961 (55 y.o. Other Clinician: Date of Birth/Sex: Female) Treating ROBSON, [place] Primary Care Physician: Lavera Guise Physician/Extender: G Referring Physician: Sallee Lange in Treatment: 0 Wound Status Wound Number: 3 Primary Etiology: Pressure Ulcer Wound Location: Right Gluteal fold Wound Status: Open Wounding Event: Pressure Injury Date Acquired: 04/21/2016 Weeks Of Treatment: 0 Clustered Wound: No Photos Photo Uploaded By: Alric Quan on 09/21/2016 13:30:36 Wound Measurements Length: (cm) 2.3 Width: (cm) 1.7 Depth: (cm) 5.6 Area: (cm) 3.071 Volume: (cm) 17.197 % Reduction in Area: % Reduction in Volume: Epithelialization: None Tunneling: No Undermining: No Wound Description Classification: Category/Stage IV Wound Margin: Distinct, outline attached Exudate Amount: Large Exudate Type: Serous Exudate Color: amber Foul Odor After Cleansing: No Wound Bed Granulation Amount: Medium (34-66%) Exposed Structure Granulation Quality: Pink Fascia Exposed: Yes Necrotic Amount: Medium (34-66%) Fat Layer Exposed: Yes Quiggle, Raynell L. ([id number]) Necrotic Quality: Adherent Slough Tendon Exposed: No Muscle Exposed: Yes Necrosis of Muscle: No Joint Exposed: No Bone Exposed: No Periwound Skin Texture Texture Color No Abnormalities Noted: No No Abnormalities Noted: No Moisture Temperature / Pain No Abnormalities Noted: No Temperature: No Abnormality Maceration: Yes Tenderness on Palpation: Yes Moist: Yes Wound Preparation Ulcer Cleansing: Rinsed/Irrigated with Saline Topical Anesthetic Applied: Other: lidocaine 4%, Electronic Signature(s) Signed: 09/21/2016 5:55:37 PM By: Alric Quan Entered By: Alric Quan on 09/21/2016 09:25:40 [place], [name] ([id number]) -------------------------------------------------------------------------------- Wound Assessment Details Patient Name:  [name] Date of Service: 09/21/2016 9:30 AM Medical Record Patient Account Number: [id number] Number: Treating RN: Ahmed Prima 1961/01/26 (55 y.o. Other Clinician: Date of Birth/Sex: Female) Treating ROBSON, [place] Primary Care Physician: Lavera Guise Physician/Extender: G Referring Physician: Sallee Lange in Treatment: 0 Wound Status Wound Number: 4 Primary Pressure Ulcer Etiology: Wound Location: Left Gluteal fold Wound Status: Open Wounding Event: Pressure Injury Comorbid Asthma, Hypertension, Type II Date Acquired: 04/21/2016 History: Diabetes, Neuropathy Weeks Of Treatment: 0 Clustered Wound: No Photos Photo Uploaded By: Alric Quan on 09/21/2016 13:30:54 Wound Measurements Length: (cm) 1.9 Width: (cm) 5.2 Depth: (cm) 1.9 Area: (cm) 7.76 Volume: (cm) 14.743 % Reduction in Area: 0% % Reduction in Volume: 0% Epithelialization: None Tunneling: No Undermining: No Wound Description Classification: Category/Stage IV Wound Margin: Distinct, outline attached Exudate Amount: Large  Exudate Type: Serous Exudate Color: amber Foul Odor After Cleansing: No Wound Bed Granulation Amount: Medium (34-66%) Exposed Structure Granulation Quality: Red, Pink Fascia Exposed: No Necrotic Amount: Medium (34-66%) Fat Layer Exposed: Yes Mollenhauer, Maddeline L. ([id number]) Necrotic Quality: Adherent Slough Tendon Exposed: No Muscle Exposed: Yes Necrosis of Muscle: No Periwound Skin Texture Texture Color No Abnormalities Noted: No No Abnormalities Noted: No Moisture Temperature / Pain No Abnormalities Noted: No Temperature: No Abnormality Maceration: Yes Tenderness on Palpation: Yes Moist: Yes Wound Preparation Ulcer Cleansing: Rinsed/Irrigated with Saline Topical Anesthetic Applied: Other: lidocaine 4%, Electronic Signature(s) Signed: 09/21/2016 5:55:37 PM By: Alric Quan Entered By: Alric Quan on 09/21/2016 10:05:49 [place], [name]  ([id number]) -------------------------------------------------------------------------------- Wound Assessment Details Patient Name: [name] Date of Service: 09/21/2016 9:30 AM Medical Record Patient Account Number: [id number] Number: Treating RN: Ahmed Prima May 21, 1961 (55 y.o. Other Clinician: Date of Birth/Sex: Female) Treating ROBSON, MICHAEL Primary Care Physician: Lavera Guise Physician/Extender: G Referring Physician: Sallee Lange in Treatment: 0 Wound Status Wound Number: 5 Primary Etiology: To be determined Wound Location: Knee - Posterior Secondary Diabetic Wound/Ulcer of the Lower Etiology: Extremity Wounding Event: Gradually Appeared Wound Status: Open Date Acquired: 07/15/2016 Weeks Of Treatment: 0 Clustered Wound: No Photos Photo Uploaded By: Alric Quan on 09/21/2016 13:31:24 Wound Measurements Length: (cm) 0.9 Width: (cm) 0.7 Depth: (cm) 15 Area: (cm) 0.495 Volume: (cm) 7.422 % Reduction in Area: 0% % Reduction in Volume: 0% Epithelialization: None Tunneling: No Undermining: No Wound Description Full Thickness With Exposed Classification: Support Structures Wound Margin: Flat and Intact Exudate Large Amount: Exudate Type: Purulent Exudate Color: yellow, brown, green Foul Odor After Cleansing: No Wound Bed Granulation Amount: Large (67-100%) Exposed Structure Fawbush, Alpa L. ([id number]) Granulation Quality: Pink Fascia Exposed: No Necrotic Amount: Small (1-33%) Fat Layer Exposed: Yes Necrotic Quality: Eschar, Adherent Slough Tendon Exposed: No Muscle Exposed: Yes Necrosis of Muscle: No Joint Exposed: No Bone Exposed: No Periwound Skin Texture Texture Color No Abnormalities Noted: No No Abnormalities Noted: No Callus: No Atrophie Blanche: No Crepitus: No Cyanosis: No Excoriation: No Ecchymosis: No Fluctuance: No Erythema: Yes Friable: No Erythema Location: Circumferential Induration:  No Hemosiderin Staining: No Localized Edema: Yes Mottled: No Rash: No Pallor: No Scarring: No Rubor: Yes Moisture Temperature / Pain No Abnormalities Noted: No Temperature: Hot Dry / Scaly: No Tenderness on Palpation: Yes Maceration: Yes Moist: Yes Wound Preparation Ulcer Cleansing: Rinsed/Irrigated with Saline Topical Anesthetic Applied: Other: lidocaine 4%, Electronic Signature(s) Signed: 09/21/2016 5:55:37 PM By: Alric Quan Signed: 09/22/2016 4:40:32 PM By: Montey Hora Entered By: Montey Hora on 09/21/2016 09:47:47 [place], [name] ([id number]) -------------------------------------------------------------------------------- Vitals Details Patient Name: [name] Date of Service: 09/21/2016 9:30 AM Medical Record Patient Account Number: [id number] Number: Treating RN: Ahmed Prima 17-Oct-1961 (55 y.o. Other Clinician: Date of Birth/Sex: Female) Treating ROBSON, MICHAEL Primary Care Physician: Lavera Guise Physician/Extender: G Referring Physician: Sallee Lange in Treatment: 0 Vital Signs Time Taken: 09:11 Temperature (F): 98.2 Height (in): 63 Pulse (bpm): 76 Source: Stated Respiratory Rate (breaths/min): 18 Weight (lbs): 257 Blood Pressure (mmHg): 128/66 Source: Stated Reference Range: 80 - 120 mg / dl Body Mass Index (BMI): 45.5 Electronic Signature(s) Signed: 09/21/2016 5:55:37 PM By: Alric Quan Entered By: Alric Quan on 09/21/2016 09:13:37

## 2016-09-30 NOTE — Progress Notes (Signed)
[name], [name] ([id number]) Visit Report for 09/28/2016 Arrival Information Details Patient Name: [name], [name] Date of Service: 09/28/2016 8:00 AM Medical Record Number: [id number] Patient Account Number: [id number] Date of Birth/Sex: [birth year] (55 y.o. Female) Treating RN: Cornell Barman Primary Care Physician: Lavera Guise Other Clinician: Referring Physician: Lavera Guise Treating Physician/Extender: Melburn Hake, HOYT Weeks in Treatment: 1 Visit Information History Since Last Visit Added or deleted any medications: No Patient Arrived: Wheel Chair Any new allergies or adverse reactions: No Arrival Time: 09:24 Had a fall or experienced change in No activities of daily living that may affect Accompanied By: self risk of falls: Transfer Assistance: None Signs or symptoms of abuse/neglect since last No Patient Identification Verified: Yes visito Secondary Verification Process Yes Hospitalized since last visit: No Completed: Has Dressing in Place as Prescribed: Yes Patient Requires Transmission-Based No Pain Present Now: No Precautions: Patient Has Alerts: Yes Patient Alerts: DM II Electronic Signature(s) Signed: 09/29/2016 4:28:52 PM By: Gretta Cool, RN, BSN, Kim RN, BSN Entered By: Gretta Cool, RN, BSN, Kim on 09/28/2016 09:24:52 [name], [name] ([id number]) -------------------------------------------------------------------------------- Encounter Discharge Information Details Patient Name: [name] Date of Service: 09/28/2016 8:00 AM Medical Record Number: [id number] Patient Account Number: [id number] Date of Birth/Sex: Mar 13, 1961 (55 y.o. Female) Treating RN: Carolyne Fiscal, Debi Primary Care Physician: Lavera Guise Other Clinician: Referring Physician: Lavera Guise Treating Physician/Extender: Melburn Hake, HOYT Weeks in Treatment: 1 Encounter Discharge Information Items Discharge Pain Level: 0 Discharge Condition: Stable Ambulatory Status: Wheelchair Discharge Destination:  Home Transportation: Private Auto Accompanied By: self Schedule Follow-up Appointment: Yes Medication Reconciliation completed and provided to Patient/Care No Stony Stegmann: Provided on Clinical Summary of Care: 09/28/2016 Form Type Recipient Paper Patient TW Electronic Signature(s) Signed: 09/28/2016 11:14:32 AM By: Montey Hora Previous Signature: 09/28/2016 10:53:50 AM Version By: Ruthine Dose Entered By: Montey Hora on 09/28/2016 11:14:32 [name], [name] ([id number]) -------------------------------------------------------------------------------- Multi Wound Chart Details Patient Name: [name] Date of Service: 09/28/2016 8:00 AM Medical Record Number: [id number] Patient Account Number: [id number] Date of Birth/Sex: 1961/05/02 (55 y.o. Female) Treating RN: Cornell Barman Primary Care Physician: Lavera Guise Other Clinician: Referring Physician: Lavera Guise Treating Physician/Extender: Melburn Hake, HOYT Weeks in Treatment: 1 Vital Signs Height(in): 63 Pulse(bpm): 77 Weight(lbs): 257 Blood Pressure 120/78 (mmHg): Body Mass Index(BMI): 46 Temperature(F): 98 Respiratory Rate 18 (breaths/min): Photos: [3:No Photos] [4:No Photos] [5:No Photos] Wound Location: [3:Right Gluteal fold] [4:Left Gluteal fold] [5:Knee - Posterior] Wounding Event: [3:Pressure Injury] [4:Pressure Injury] [5:Gradually Appeared] Primary Etiology: [3:Pressure Ulcer] [4:Pressure Ulcer] [5:To be determined] Secondary Etiology: [3:N/A] [4:N/A] [5:Diabetic Wound/Ulcer of the Lower Extremity] Comorbid History: [3:Asthma, Hypertension, Type II Diabetes, Neuropathy] [4:Asthma, Hypertension, Type II Diabetes, Neuropathy] [5:Asthma, Hypertension, Type II Diabetes, Neuropathy] Date Acquired: [3:04/21/2016] [4:04/21/2016] [5:07/15/2016] Weeks of Treatment: [3:1] [4:1] [5:1] Wound Status: [3:Open] [4:Open] [5:Open] Measurements L x W x D 2.4x1.8x5.5 [4:2.3x5x1.2] [5:0.8x0.8x1.5] (cm) Area (cm) : [3:3.393]  [4:9.032] [5:0.503] Volume (cm) : [3:18.661] [4:10.838] [5:0.754] % Reduction in Area: [3:-10.50%] [4:-16.40%] [5:-1.60%] % Reduction in Volume: -8.50% [4:26.50%] [5:89.80%] Position 1 (o'clock): [4:9] [5:9] Maximum Distance 1 [4:1.2] [5:5.8] (cm): Tunneling: [3:N/A] [4:Yes] [5:Yes] Classification: [3:Category/Stage IV] [4:Category/Stage IV] [5:Full Thickness With Exposed Support Structures] HBO Classification: [3:N/A] [4:N/A] [5:Grade 2] Exudate Amount: [3:Large] [4:Large] [5:Large] Exudate Type: [3:Serous] [4:Serous] [5:Purulent] Exudate Color: [3:amber] [4:amber] [5:yellow, brown, green] Wound Margin: [3:Distinct, outline attached Distinct, outline attached Flat and Intact] Granulation Amount: Medium (34-66%) Small (1-33%) Large (67-100%) Granulation Quality: Pale, Hyper-granulation Red, Pink Pink Necrotic Amount: Medium (34-66%) Large (67-100%) Small (1-33%) Necrotic Tissue: Adherent [facility]  Adherent [place] Exposed Structures: Fascia: Yes Fat: Yes Fat: Yes Fat: Yes Muscle: Yes Muscle: Yes Muscle: Yes Fascia: No Fascia: No Tendon: No Tendon: No Tendon: No Joint: No Joint: No Bone: No Bone: No Epithelialization: None None None Periwound Skin Texture: No Abnormalities Noted No Abnormalities Noted Edema: Yes Excoriation: No Induration: No Callus: No Crepitus: No Fluctuance: No Friable: No Rash: No Scarring: No Periwound Skin Maceration: Yes Maceration: Yes Maceration: Yes Moisture: Moist: Yes Moist: Yes Moist: Yes Dry/Scaly: No Periwound Skin Color: No Abnormalities Noted No Abnormalities Noted Erythema: Yes Rubor: Yes Atrophie Blanche: No Cyanosis: No Ecchymosis: No Hemosiderin Staining: No Mottled: No Pallor: No Erythema Location: N/A N/A Circumferential Temperature: No Abnormality No Abnormality Hot Tenderness on Yes Yes Yes Palpation: Wound Preparation: Ulcer Cleansing: Ulcer Cleansing: Ulcer  Cleansing: Rinsed/Irrigated with Rinsed/Irrigated with Rinsed/Irrigated with Saline Saline Saline Topical Anesthetic Topical Anesthetic Topical Anesthetic Applied: Other: lidocaine Applied: Other: lidocaine Applied: Other: lidocaine 4% 4% 4% Treatment Notes Electronic Signature(s) Signed: 09/28/2016 12:28:11 PM By: Montey Hora Entered By: Montey Hora on 09/28/2016 10:23:40 [name], [name] ([id number]) [place], [name] -------------------------------------------------------------------------------- Multi-Disciplinary Care Plan Details Patient Name: [name] Date of Service: 09/28/2016 8:00 AM Medical Record Number: [id number] Patient Account Number: [id number] Date of Birth/Sex: December 21, 1961 (55 y.o. Female) Treating RN: Cornell Barman Primary Care Physician: Lavera Guise Other Clinician: Referring Physician: Lavera Guise Treating Physician/Extender: Melburn Hake, HOYT Weeks in Treatment: 1 Active Inactive Abuse / Safety / Falls / Self Care Management Nursing Diagnoses: Potential for falls Goals: Patient will remain injury free Date Initiated: 09/21/2016 Goal Status: Active Interventions: Assess fall risk on admission and as needed Notes: Nutrition Nursing Diagnoses: Imbalanced nutrition Potential for alteratiion in Nutrition/Potential for imbalanced nutrition Goals: Patient/caregiver agrees to and verbalizes understanding of need to use nutritional supplements and/or vitamins as prescribed Date Initiated: 09/21/2016 Goal Status: Active Patient/caregiver verbalizes understanding of need to maintain therapeutic glucose control per primary care physician Date Initiated: 09/21/2016 Goal Status: Active Interventions: Assess patient nutrition upon admission and as needed per policy Notes: Orientation to the [place], [facility] ([id number]) Nursing Diagnoses: Knowledge deficit related to the wound healing center program Goals: Patient/caregiver will  verbalize understanding of the [place] Program Date Initiated: 09/21/2016 Goal Status: Active Interventions: Provide education on orientation to the wound center Notes: Pain, Acute or Chronic Nursing Diagnoses: Pain, acute or chronic: actual or potential Potential alteration in comfort, pain Goals: Patient will verbalize adequate pain control and receive pain control interventions during procedures as needed Date Initiated: 09/21/2016 Goal Status: Active Patient/caregiver will verbalize adequate pain control between visits Date Initiated: 09/21/2016 Goal Status: Active Patient/caregiver will verbalize comfort level met Date Initiated: 09/21/2016 Goal Status: Active Interventions: Assess comfort goal upon admission Complete pain assessment as per visit requirements Notes: Wound/Skin Impairment Nursing Diagnoses: Impaired tissue integrity Goals: Ulcer/skin breakdown will have a volume reduction of 30% by week 4 Date Initiated: 09/21/2016 Goal Status: Active Milhoan, Jeannifer L. ([id number]) Ulcer/skin breakdown will have a volume reduction of 50% by week 8 Date Initiated: 09/21/2016 Goal Status: Active Ulcer/skin breakdown will have a volume reduction of 80% by week 12 Date Initiated: 09/21/2016 Goal Status: Active Interventions: Assess ulceration(s) every visit Notes: Electronic Signature(s) Signed: 09/28/2016 12:28:11 PM By: Montey Hora Signed: 09/29/2016 4:28:52 PM By: Gretta Cool, RN, BSN, Kim RN, BSN Entered By: Montey Hora on 09/28/2016 10:23:25 [name], [name] ([id number]) -------------------------------------------------------------------------------- Pain Assessment Details Patient Name: [name] Date of Service: 09/28/2016 8:00  AM Medical Record Number: [id number] Patient Account Number: [id number] Date of Birth/Sex: [birth year] (56 y.o. Female) Treating RN: Cornell Barman Primary Care Physician: Lavera Guise Other Clinician: Referring Physician: Lavera Guise Treating Physician/Extender: Melburn Hake, HOYT Weeks in Treatment: 1 Active Problems Location of Pain Severity and Description of Pain Patient Has Paino Yes Site Locations Pain Location: Pain in Ulcers With Dressing Change: Yes Duration of the Pain. Constant / Intermittento Constant Rate the pain. Current Pain Level: 7 Worst Pain Level: 10 Character of Pain Describe the Pain: Shooting, Tender, Throbbing Pain Management and Medication Current Pain Management: Electronic Signature(s) Signed: 09/29/2016 4:28:52 PM By: Gretta Cool, RN, BSN, Kim RN, BSN Entered By: Gretta Cool, RN, BSN, Kim on 09/28/2016 09:25:13 [place], [name] ([id number]) -------------------------------------------------------------------------------- Patient/Caregiver Education Details Patient Name: [name] Date of Service: 09/28/2016 8:00 AM Medical Record Number: [id number] Patient Account Number: [id number] Date of Birth/Gender: Apr 14, 1961 (55 y.o. Female) Treating RN: Montey Hora Primary Care Physician: Lavera Guise Other Clinician: Referring Physician: Lavera Guise Treating Physician/Extender: Sharalyn Ink in Treatment: 1 Education Assessment Education Provided To: Patient Education Topics Provided Nutrition: Handouts: Nutrition Methods: Explain/Verbal Responses: State content correctly Electronic Signature(s) Signed: 09/28/2016 12:28:11 PM By: Montey Hora Entered By: Montey Hora on 09/28/2016 11:14:47 [name], [name] ([id number]) -------------------------------------------------------------------------------- Wound Assessment Details Patient Name: [name] Date of Service: 09/28/2016 8:00 AM Medical Record Number: [id number] Patient Account Number: [id number] Date of Birth/Sex: Jun 24, 1961 (55 y.o. Female) Treating RN: Cornell Barman Primary Care Physician: Lavera Guise Other Clinician: Referring Physician: Lavera Guise Treating Physician/Extender: Melburn Hake, HOYT Weeks in  Treatment: 1 Wound Status Wound Number: 3 Primary Pressure Ulcer Etiology: Wound Location: Right Gluteal fold Wound Status: Open Wounding Event: Pressure Injury Comorbid Asthma, Hypertension, Type II Date Acquired: 04/21/2016 History: Diabetes, Neuropathy Weeks Of Treatment: 1 Clustered Wound: No Photos Photo Uploaded By: Gretta Cool, RN, BSN, Kim on 09/29/2016 11:41:29 Wound Measurements Length: (cm) 2.4 Width: (cm) 1.8 Depth: (cm) 5.5 Area: (cm) 3.393 Volume: (cm) 18.661 % Reduction in Area: -10.5% % Reduction in Volume: -8.5% Epithelialization: None Wound Description Classification: Category/Stage IV Wound Margin: Distinct, outline attached Exudate Amount: Large Exudate Type: Serous Exudate Color: amber Foul Odor After Cleansing: No Wound Bed Granulation Amount: Medium (34-66%) Exposed Structure Granulation Quality: Pale, Hyper-granulation Fascia Exposed: Yes Necrotic Amount: Medium (34-66%) Fat Layer Exposed: Yes Necrotic Quality: Adherent Slough Tendon Exposed: No Muscle Exposed: Yes Necrosis of Muscle: No Joint Exposed: No [name], [name] ([id number]) Bone Exposed: No Periwound Skin Texture Texture Color No Abnormalities Noted: No No Abnormalities Noted: No Moisture Temperature / Pain No Abnormalities Noted: No Temperature: No Abnormality Maceration: Yes Tenderness on Palpation: Yes Moist: Yes Wound Preparation Ulcer Cleansing: Rinsed/Irrigated with Saline Topical Anesthetic Applied: Other: lidocaine 4%, Treatment Notes Wound #3 (Right Gluteal fold) 1. Cleansed with: Clean wound with Normal Saline 2. Anesthetic Topical Lidocaine 4% cream to wound bed prior to debridement 4. Dressing Applied: Aquacel Ag 5. Secondary Dressing Applied Bordered Foam Dressing 7. Secured with Recruitment consultant) Signed: 09/29/2016 4:28:52 PM By: Gretta Cool, RN, BSN, Kim RN, BSN Entered By: Gretta Cool, RN, BSN, Kim on 09/28/2016 09:33:25 [name], [name]  ([id number]) -------------------------------------------------------------------------------- Wound Assessment Details Patient Name: [name] Date of Service: 09/28/2016 8:00 AM Medical Record Number: [id number] Patient Account Number: [id number] Date of Birth/Sex: [birth year] (55 y.o. Female) Treating RN: Cornell Barman Primary Care Physician: Lavera Guise Other Clinician: Referring Physician: Lavera Guise Treating Physician/Extender: Melburn Hake, HOYT Weeks in Treatment: 1 Wound Status Wound Number: 4 Primary Pressure  Ulcer Etiology: Wound Location: Left Gluteal fold Wound Status: Open Wounding Event: Pressure Injury Comorbid Asthma, Hypertension, Type II Date Acquired: 04/21/2016 History: Diabetes, Neuropathy Weeks Of Treatment: 1 Clustered Wound: No Photos Photo Uploaded By: Gretta Cool, RN, BSN, Kim on 09/29/2016 11:41:30 Wound Measurements Length: (cm) 2.3 Width: (cm) 5 Depth: (cm) 1.2 Area: (cm) 9.032 Volume: (cm) 10.838 % Reduction in Area: -16.4% % Reduction in Volume: 26.5% Epithelialization: None Tunneling: Yes Position (o'clock): 9 Maximum Distance: (cm) 1.2 Undermining: No Wound Description Classification: Category/Stage IV Wound Margin: Distinct, outline attached Exudate Amount: Large Exudate Type: Serous Exudate Color: amber Foul Odor After Cleansing: No Wound Bed Granulation Amount: Small (1-33%) Exposed Structure Granulation Quality: Red, Pink Fascia Exposed: No Necrotic Amount: Large (67-100%) Fat Layer Exposed: Yes Necrotic Quality: Adherent Slough Tendon Exposed: No [name], [name] ([id number]) Muscle Exposed: Yes Necrosis of Muscle: No Periwound Skin Texture Texture Color No Abnormalities Noted: No No Abnormalities Noted: No Moisture Temperature / Pain No Abnormalities Noted: No Temperature: No Abnormality Maceration: Yes Tenderness on Palpation: Yes Moist: Yes Wound Preparation Ulcer Cleansing: Rinsed/Irrigated with Saline Topical  Anesthetic Applied: Other: lidocaine 4%, Treatment Notes Wound #4 (Left Gluteal fold) 1. Cleansed with: Clean wound with Normal Saline 2. Anesthetic Topical Lidocaine 4% cream to wound bed prior to debridement 4. Dressing Applied: Aquacel Ag 5. Secondary Dressing Applied Bordered Foam Dressing 7. Secured with Recruitment consultant) Signed: 09/29/2016 4:28:52 PM By: Gretta Cool, RN, BSN, Kim RN, BSN Entered By: Gretta Cool, RN, BSN, Kim on 09/28/2016 09:30:28 [name], [name] ([id number]) -------------------------------------------------------------------------------- Wound Assessment Details Patient Name: [name] Date of Service: 09/28/2016 8:00 AM Medical Record Number: [id number] Patient Account Number: [id number] Date of Birth/Sex: July 23, 1961 (55 y.o. Female) Treating RN: Cornell Barman Primary Care Physician: Lavera Guise Other Clinician: Referring Physician: Lavera Guise Treating Physician/Extender: Melburn Hake, HOYT Weeks in Treatment: 1 Wound Status Wound Number: 5 Primary To be determined Etiology: Wound Location: Knee - Posterior Secondary Diabetic Wound/Ulcer of the Lower Wounding Event: Gradually Appeared Etiology: Extremity Date Acquired: 07/15/2016 Wound Status: Open Weeks Of Treatment: 1 Comorbid Asthma, Hypertension, Type II Clustered Wound: No History: Diabetes, Neuropathy Photos Photo Uploaded By: Gretta Cool, RN, BSN, Kim on 09/29/2016 11:41:41 Wound Measurements Length: (cm) 0.8 Width: (cm) 0.8 Depth: (cm) 1.5 Area: (cm) 0.503 Volume: (cm) 0.754 % Reduction in Area: -1.6% % Reduction in Volume: 89.8% Epithelialization: None Tunneling: Yes Position (o'clock): 9 Maximum Distance: (cm) 5.8 Wound Description Full Thickness With Exposed Foul Odor Af Classification: Support Structures Diabetic Severity Grade 2 (Wagner): Wound Margin: Flat and Intact Exudate Amount: Large Exudate Type: Purulent Exudate Color: yellow, brown, green ter Cleansing:  No Wound Bed Granulation Amount: Large (67-100%) Exposed Structure [name], [name] ([id number]) Granulation Quality: Pink Fascia Exposed: No Necrotic Amount: Small (1-33%) Fat Layer Exposed: Yes Necrotic Quality: Eschar, Adherent Slough Tendon Exposed: No Muscle Exposed: Yes Necrosis of Muscle: No Joint Exposed: No Bone Exposed: No Periwound Skin Texture Texture Color No Abnormalities Noted: No No Abnormalities Noted: No Callus: No Atrophie Blanche: No Crepitus: No Cyanosis: No Excoriation: No Ecchymosis: No Fluctuance: No Erythema: Yes Friable: No Erythema Location: Circumferential Induration: No Hemosiderin Staining: No Localized Edema: Yes Mottled: No Rash: No Pallor: No Scarring: No Rubor: Yes Moisture Temperature / Pain No Abnormalities Noted: No Temperature: Hot Dry / Scaly: No Tenderness on Palpation: Yes Maceration: Yes Moist: Yes Wound Preparation Ulcer Cleansing: Rinsed/Irrigated with Saline Topical Anesthetic Applied: Other: lidocaine 4%, Treatment Notes Wound #5 (Posterior Knee) 1. Cleansed with: Clean wound with Normal Saline  2. Anesthetic Topical Lidocaine 4% cream to wound bed prior to debridement 4. Dressing Applied: Aquacel Ag 5. Secondary Dressing Applied Bordered Foam Dressing 7. Secured with Recruitment consultant) Signed: 09/28/2016 12:28:11 PM By: Montey Hora Signed: 09/29/2016 4:28:52 PM By: Gretta Cool RN, BSN, Kim RN, BSN [place], [place] ([id number]) Entered By: Montey Hora on 09/28/2016 10:21:40 [name], [name] ([id number]) -------------------------------------------------------------------------------- [place] Details Patient Name: [name] Date of Service: 09/28/2016 8:00 AM Medical Record Number: [id number] Patient Account Number: [id number] Date of Birth/Sex: 25-May-1961 (55 y.o. Female) Treating RN: Cornell Barman Primary Care Physician: Lavera Guise Other Clinician: Referring Physician: Lavera Guise Treating  Physician/Extender: Melburn Hake, HOYT Weeks in Treatment: 1 Vital Signs Time Taken: 09:25 Temperature (F): 98 Height (in): 63 Pulse (bpm): 77 Weight (lbs): 257 Respiratory Rate (breaths/min): 18 Body Mass Index (BMI): 45.5 Blood Pressure (mmHg): 120/78 Reference Range: 80 - 120 mg / dl Electronic Signature(s) Signed: 09/29/2016 4:28:52 PM By: Gretta Cool, RN, BSN, Kim RN, BSN Entered By: Gretta Cool, RN, BSN, Kim on 09/28/2016 27:07:86

## 2016-10-03 ENCOUNTER — Other Ambulatory Visit: Payer: Self-pay | Admitting: Internal Medicine

## 2016-10-03 ENCOUNTER — Ambulatory Visit
Admission: RE | Admit: 2016-10-03 | Discharge: 2016-10-03 | Disposition: A | Discharge status: Home / Self Care | Payer: Managed Care, Other (non HMO) | Source: Ambulatory Visit | Attending: Internal Medicine | Admitting: Internal Medicine

## 2016-10-03 DIAGNOSIS — B999 Unspecified infectious disease: Secondary | ICD-10-CM

## 2016-10-03 DIAGNOSIS — L97821 Non-pressure chronic ulcer of other part of left lower leg limited to breakdown of skin: Secondary | ICD-10-CM | POA: Diagnosis not present

## 2016-10-05 ENCOUNTER — Encounter: Payer: Managed Care, Other (non HMO) | Admitting: Physician Assistant

## 2016-10-05 DIAGNOSIS — L89323 Pressure ulcer of left buttock, stage 3: Secondary | ICD-10-CM | POA: Diagnosis not present

## 2016-10-06 ENCOUNTER — Other Ambulatory Visit: Payer: Self-pay | Admitting: Physician Assistant

## 2016-10-06 DIAGNOSIS — L89314 Pressure ulcer of right buttock, stage 4: Secondary | ICD-10-CM

## 2016-10-06 DIAGNOSIS — L89323 Pressure ulcer of left buttock, stage 3: Secondary | ICD-10-CM

## 2016-10-06 DIAGNOSIS — E1142 Type 2 diabetes mellitus with diabetic polyneuropathy: Secondary | ICD-10-CM

## 2016-10-06 DIAGNOSIS — L97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 severity: Secondary | ICD-10-CM

## 2016-10-06 NOTE — Progress Notes (Signed)
KIYLEE, [name] ([id number]) Visit Report for 10/05/2016 Arrival Information Details Patient Name: [name], [name] Date of Service: 10/05/2016 8:00 AM Medical Record Number: [id number] Patient Account Number: [id number] Date of Birth/Sex: 1961/05/22 (55 y.o. Female) Treating RN: Ashok Cordia, Debi Primary Care Physician: Joen [name] Other Clinician: Referring Physician: Joen [name] Treating Physician/Extender: Linwood Dibbles, HOYT Weeks in Treatment: 2 Visit Information History Since Last Visit All ordered tests and consults were completed: No Patient Arrived: Wheel Chair Added or deleted any medications: No Arrival Time: 08:11 Any new allergies or adverse reactions: No Accompanied By: self Had a fall or experienced change in No Transfer Assistance: EasyP[employer] activities of daily living that may affect Patient Lift risk of falls: Patient Identification Verified: Yes Signs or symptoms of abuse/neglect since last No Secondary Verification Process Yes visito Completed: Hospitalized since last visit: No Patient Requires Transmission- No Pain Present Now: Yes Based Precautions: Patient Has Alerts: Yes Patient Alerts: DM II Electronic Signature(s) Signed: 10/05/2016 4:35:45 PM By: Alejandro Mulling Entered By: Alejandro Mulling on 10/05/2016 08:11:34 Hinderman, [name] ([id number]) -------------------------------------------------------------------------------- Encounter Discharge Information Details Patient Name: [name] Date of Service: 10/05/2016 8:00 AM Medical Record Number: [id number] Patient Account Number: [id number] Date of Birth/Sex: [birth year] (55 y.o. Female) Treating RN: Ashok Cordia, Debi Primary Care Physician: Joen [name] Other Clinician: Referring Physician: Joen [name] Treating Physician/Extender: Linwood Dibbles, HOYT Weeks in Treatment: 2 Encounter Discharge Information Items Discharge Pain Level: 0 Discharge Condition: Stable Ambulatory Status: Wheelchair Discharge  Destination: Home Transportation: Other Accompanied By: self Schedule Follow-up Appointment: Yes Medication Reconciliation completed and provided to Patient/Care Yes Trenace Coughlin: Provided on Clinical Summary of Care: 10/05/2016 Form Type Recipient Paper Patient TW Electronic Signature(s) Signed: 10/05/2016 9:30:09 AM By: Gwenlyn Perking Entered By: Gwenlyn Perking on 10/05/2016 09:30:09 Lindon, [name] ([id number]) -------------------------------------------------------------------------------- Lower Extremity Assessment Details Patient Name: [name] Date of Service: 10/05/2016 8:00 AM Medical Record Number: [id number] Patient Account Number: [id number] Date of Birth/Sex: 02/13/1961 (55 y.o. Female) Treating RN: Phillis Haggis Primary Care Physician: Joen [name] Other Clinician: Referring Physician: Joen [name] Treating Physician/Extender: Linwood Dibbles, HOYT Weeks in Treatment: 2 Electronic Signature(s) Signed: 10/05/2016 4:35:45 PM By: Alejandro Mulling Entered By: Alejandro Mulling on 10/05/2016 08:17:42 Monfils, Josslyn LMarland Kitchen ([id number]) -------------------------------------------------------------------------------- Multi Wound Chart Details Patient Name: [name] Date of Service: 10/05/2016 8:00 AM Medical Record Number: [id number] Patient Account Number: [id number] Date of Birth/Sex: 06-Mar-1961 (55 y.o. Female) Treating RN: Ashok Cordia, Debi Primary Care Physician: Joen [name] Other Clinician: Referring Physician: Joen [name] Treating Physician/Extender: Linwood Dibbles, HOYT Weeks in Treatment: 2 Vital Signs Height(in): 63 Pulse(bpm): 82 Weight(lbs): 257 Blood Pressure 123/48 (mmHg): Body Mass Index(BMI): 46 Temperature(F): 97.7 Respiratory Rate 18 (breaths/min): Photos: [3:No Photos] [4:No Photos] [5:No Photos] Wound Location: [3:Right Gluteal fold] [4:Left Gluteal fold] [5:Knee - Posterior] Wounding Event: [3:Pressure Injury] [4:Pressure Injury] [5:Gradually  Appeared] Primary Etiology: [3:Pressure Ulcer] [4:Pressure Ulcer] [5:Diabetic Wound/Ulcer of the Lower Extremity] Comorbid History: [3:Asthma, Hypertension, Type II Diabetes, Neuropathy] [4:Asthma, Hypertension, Type II Diabetes, Neuropathy] [5:Asthma, Hypertension, Type II Diabetes, Neuropathy] Date Acquired: [3:04/21/2016] [4:04/21/2016] [5:07/15/2016] Weeks of Treatment: [3:2] [4:2] [5:2] Wound Status: [3:Open] [4:Open] [5:Open] Measurements L x W x D 1.8x1.2x5 [4:1.5x4.5x0.1] [5:0.5x0.5x12.4] (cm) Area (cm) : [3:1.696] [4:5.301] [5:0.196] Volume (cm) : [3:8.482] [4:0.53] [5:2.435] % Reduction in Area: [3:44.80%] [4:31.70%] [5:60.40%] % Reduction in Volume: 50.70% [4:96.40%] [5:67.20%] Classification: [3:Category/Stage IV] [4:Category/Stage IV] [5:Grade 2] Exudate Amount: [3:Large] [4:Large] [5:Large] Exudate Type: [3:Serous] [4:Serous] [5:Serous] Exudate Color: [3:amber] [4:amber] [5:amber] Wound Margin: [3:Distinct, outline attached] [4:Distinct, outline attached] [5:Flat  and Intact] Granulation Amount: [3:Large (67-100%)] [4:Large (67-100%)] [5:Medium (34-66%)] Granulation Quality: [3:Pink, Pale] [4:Red, Pink] [5:Pink] Necrotic Amount: [3:Small (1-33%)] [4:Small (1-33%)] [5:Medium (34-66%)] Exposed Structures: [3:Fascia: Yes Fat: Yes Muscle: Yes Tendon: No] [4:Fat: Yes Muscle: Yes Fascia: No Tendon: No] [5:Fat: Yes Muscle: Yes Fascia: No Tendon: No] Joint: No Joint: No Bone: No Bone: No Epithelialization: None None None Periwound Skin Texture: No Abnormalities Noted No Abnormalities Noted Edema: Yes Excoriation: No Induration: No Callus: No Crepitus: No Fluctuance: No Friable: No Rash: No Scarring: No Periwound Skin Maceration: Yes Maceration: Yes Maceration: Yes Moisture: Moist: Yes Moist: Yes Moist: Yes Dry/Scaly: No Periwound Skin Color: No Abnormalities Noted No Abnormalities Noted Erythema: Yes Rubor: Yes Atrophie Blanche: No Cyanosis: No Ecchymosis:  No Hemosiderin Staining: No Mottled: No Pallor: No Erythema Location: N/A N/A Circumferential Temperature: No Abnormality No Abnormality Hot Tenderness on Yes Yes Yes Palpation: Wound Preparation: Ulcer Cleansing: Ulcer Cleansing: Ulcer Cleansing: Rinsed/Irrigated with Rinsed/Irrigated with Rinsed/Irrigated with Saline Saline Saline Topical Anesthetic Topical Anesthetic Topical Anesthetic Applied: Other: lidocaine Applied: Other: lidocaine Applied: Other: lidocaine 4% 4% 4% Treatment Notes Electronic Signature(s) Signed: 10/05/2016 4:35:45 PM By: Alejandro Mulling Entered By: Alejandro Mulling on 10/05/2016 08:31:24 Kantz, [name] ([id number]) -------------------------------------------------------------------------------- Multi-Disciplinary Care Plan Details Patient Name: [name] Date of Service: 10/05/2016 8:00 AM Medical Record Number: [id number] Patient Account Number: [id number] Date of Birth/Sex: 18-Feb-1961 (55 y.o. Female) Treating RN: Ashok Cordia, Debi Primary Care Physician: Joen [name] Other Clinician: Referring Physician: Joen [name] Treating Physician/Extender: Linwood Dibbles, HOYT Weeks in Treatment: 2 Active Inactive Abuse / Safety / Falls / Self Care Management Nursing Diagnoses: Potential for falls Goals: Patient will remain injury free Date Initiated: 09/21/2016 Goal Status: Active Interventions: Assess fall risk on admission and as needed Notes: Nutrition Nursing Diagnoses: Imbalanced nutrition Potential for alteratiion in Nutrition/Potential for imbalanced nutrition Goals: Patient/caregiver agrees to and verbalizes understanding of need to use nutritional supplements and/or vitamins as prescribed Date Initiated: 09/21/2016 Goal Status: Active Patient/caregiver verbalizes understanding of need to maintain therapeutic glucose control per primary care physician Date Initiated: 09/21/2016 Goal Status: Active Interventions: Assess patient nutrition  upon admission and as needed per policy Notes: Orientation to the Wound Care Program [place], KALEYA DOUSE ([id number]) Nursing Diagnoses: Knowledge deficit related to the wound healing center program Goals: Patient/caregiver will verbalize understanding of the Wound Healing Center Program Date Initiated: 09/21/2016 Goal Status: Active Interventions: Provide education on orientation to the wound center Notes: Pain, Acute or Chronic Nursing Diagnoses: Pain, acute or chronic: actual or potential Potential alteration in comfort, pain Goals: Patient will verbalize adequate pain control and receive pain control interventions during procedures as needed Date Initiated: 09/21/2016 Goal Status: Active Patient/caregiver will verbalize adequate pain control between visits Date Initiated: 09/21/2016 Goal Status: Active Patient/caregiver will verbalize comfort level met Date Initiated: 09/21/2016 Goal Status: Active Interventions: Assess comfort goal upon admission Complete pain assessment as per visit requirements Notes: Wound/Skin Impairment Nursing Diagnoses: Impaired tissue integrity Goals: Ulcer/skin breakdown will have a volume reduction of 30% by week 4 Date Initiated: 09/21/2016 Goal Status: Active Risser, Myishia L. ([id number]) Ulcer/skin breakdown will have a volume reduction of 50% by week 8 Date Initiated: 09/21/2016 Goal Status: Active Ulcer/skin breakdown will have a volume reduction of 80% by week 12 Date Initiated: 09/21/2016 Goal Status: Active Interventions: Assess ulceration(s) every visit Notes: Electronic Signature(s) Signed: 10/05/2016 4:35:45 PM By: Alejandro Mulling Entered By: Alejandro Mulling on 10/05/2016 08:31:17 Dona, Shalawn L. ([id number]) -------------------------------------------------------------------------------- Pain Assessment Details Patient Name: Bollig, Brent L.  Date of Service: 10/05/2016 8:00 AM Medical Record Number: [id number] Patient Account Number:  [id number] Date of Birth/Sex: May 19, 1961 (55 y.o. Female) Treating RN: Carolyne Fiscal, Debi Primary Care Physician: Lavera Guise Other Clinician: Referring Physician: Lavera Guise Treating Physician/Extender: Melburn Hake, HOYT Weeks in Treatment: 2 Active Problems Location of Pain Severity and Description of Pain Patient Has Paino Yes Site Locations Pain Location: Pain in Ulcers With Dressing Change: Yes Duration of the Pain. Constant / Intermittento Constant Rate the pain. Current Pain Level: 5 Worst Pain Level: 8 Least Pain Level: 5 Character of Pain Describe the Pain: Aching, Tender Pain Management and Medication Current Pain Management: Electronic Signature(s) Signed: 10/05/2016 4:35:45 PM By: Alric Quan Entered By: Alric Quan on 10/05/2016 08:12:17 Graziani, [name] ([id number]) -------------------------------------------------------------------------------- Patient/Caregiver Education Details Patient Name: [name] Date of Service: 10/05/2016 8:00 AM Medical Record Number: [id number] Patient Account Number: [id number] Date of Birth/Gender: 10/29/1961 (55 y.o. Female) Treating RN: Carolyne Fiscal, Debi Primary Care Physician: Lavera Guise Other Clinician: Referring Physician: Lavera Guise Treating Physician/Extender: Sharalyn Ink in Treatment: 2 Education Assessment Education Provided To: Patient Education Topics Provided Wound/Skin Impairment: Handouts: Other: change dressing as ordered Methods: Demonstration, Explain/Verbal Responses: State content correctly Electronic Signature(s) Signed: 10/05/2016 4:35:45 PM By: Alric Quan Entered By: Alric Quan on 10/05/2016 09:08:35 Flett, [place] ([id number]) -------------------------------------------------------------------------------- Wound Assessment Details Patient Name: [name] Date of Service: 10/05/2016 8:00 AM Medical Record Number: [id number] Patient Account Number:  [id number] Date of Birth/Sex: 19-Jul-1961 (55 y.o. Female) Treating RN: Carolyne Fiscal, Debi Primary Care Physician: Lavera Guise Other Clinician: Referring Physician: Lavera Guise Treating Physician/Extender: Melburn Hake, HOYT Weeks in Treatment: 2 Wound Status Wound Number: 3 Primary Pressure Ulcer Etiology: Wound Location: Right Gluteal fold Wound Status: Open Wounding Event: Pressure Injury Comorbid Asthma, Hypertension, Type II Date Acquired: 04/21/2016 History: Diabetes, Neuropathy Weeks Of Treatment: 2 Clustered Wound: No Photos Wound Measurements Length: (cm) 1.8 Width: (cm) 1.2 Depth: (cm) 5 Area: (cm) 1.696 Volume: (cm) 8.482 % Reduction in Area: 44.8% % Reduction in Volume: 50.7% Epithelialization: None Tunneling: No Undermining: No Wound Description Classification: Category/Stage IV Wound Margin: Distinct, outline attached Exudate Amount: Large Exudate Type: Serous Exudate Color: amber Foul Odor After Cleansing: No Wound Bed Granulation Amount: Large (67-100%) Exposed Structure Granulation Quality: Pink, Pale Fascia Exposed: Yes Necrotic Amount: Small (1-33%) Fat Layer Exposed: Yes Necrotic Quality: Adherent Slough Tendon Exposed: No Muscle Exposed: Yes Rosano, Zera L. ([id number]) Necrosis of Muscle: No Joint Exposed: No Bone Exposed: No Periwound Skin Texture Texture Color No Abnormalities Noted: No No Abnormalities Noted: No Moisture Temperature / Pain No Abnormalities Noted: No Temperature: No Abnormality Maceration: Yes Tenderness on Palpation: Yes Moist: Yes Wound Preparation Ulcer Cleansing: Rinsed/Irrigated with Saline Topical Anesthetic Applied: Other: lidocaine 4%, Assessment Notes The picture is correct for this wound but the wound tag was incorrect this is the right Gluteal Fold. Treatment Notes Wound #3 (Right Gluteal fold) 1. Cleansed with: Clean wound with Normal Saline 2. Anesthetic Topical Lidocaine 4% cream to wound bed  prior to debridement 3. Peri-wound Care: Skin Prep 4. Dressing Applied: Aquacel Ag 5. Secondary Dressing Applied Bordered Foam Dressing Dry Gauze Electronic Signature(s) Signed: 10/05/2016 4:35:45 PM By: Alric Quan Entered By: Alric Quan on 10/05/2016 11:27:56 Knapke, [name] ([id number]) -------------------------------------------------------------------------------- Wound Assessment Details Patient Name: [name] Date of Service: 10/05/2016 8:00 AM Medical Record Number: [id number] Patient Account Number: [id number] Date of Birth/Sex: 04-Nov-1961 (55 y.o. Female) Treating RN: Carolyne Fiscal, Debi Primary Care Physician: Lavera Guise Other Clinician:  Referring Physician: Lavera Guise Treating Physician/Extender: Melburn Hake, HOYT Weeks in Treatment: 2 Wound Status Wound Number: 4 Primary Pressure Ulcer Etiology: Wound Location: Left Gluteal fold Wound Status: Open Wounding Event: Pressure Injury Comorbid Asthma, Hypertension, Type II Date Acquired: 04/21/2016 History: Diabetes, Neuropathy Weeks Of Treatment: 2 Clustered Wound: No Photos Wound Measurements Length: (cm) 1.5 Width: (cm) 4.5 Depth: (cm) 0.1 Area: (cm) 5.301 Volume: (cm) 0.53 % Reduction in Area: 31.7% % Reduction in Volume: 96.4% Epithelialization: None Tunneling: No Undermining: No Wound Description Classification: Category/Stage IV Wound Margin: Distinct, outline attached Exudate Amount: Large Exudate Type: Serous Exudate Color: amber Foul Odor After Cleansing: No Wound Bed Granulation Amount: Large (67-100%) Exposed Structure Granulation Quality: Red, Pink Fascia Exposed: No Necrotic Amount: Small (1-33%) Fat Layer Exposed: Yes Necrotic Quality: Adherent Slough Tendon Exposed: No Muscle Exposed: Yes Lua, Alissandra L. ([id number]) Necrosis of Muscle: No Periwound Skin Texture Texture Color No Abnormalities Noted: No No Abnormalities Noted: No Moisture Temperature / Pain No  Abnormalities Noted: No Temperature: No Abnormality Maceration: Yes Tenderness on Palpation: Yes Moist: Yes Wound Preparation Ulcer Cleansing: Rinsed/Irrigated with Saline Topical Anesthetic Applied: Other: lidocaine 4%, Assessment Notes The picture is correct for this wound but the wound tag was incorrect this is the left Gluteal Fold. Treatment Notes Wound #4 (Left Gluteal fold) 1. Cleansed with: Clean wound with Normal Saline 2. Anesthetic Topical Lidocaine 4% cream to wound bed prior to debridement 3. Peri-wound Care: Skin Prep 4. Dressing Applied: Aquacel Ag 5. Secondary Dressing Applied Bordered Foam Dressing Dry Gauze Electronic Signature(s) Signed: 10/05/2016 4:35:45 PM By: Alric Quan Entered By: Alric Quan on 10/05/2016 11:28:27 Lapierre, [place] ([id number]) -------------------------------------------------------------------------------- Wound Assessment Details Patient Name: [name] Date of Service: 10/05/2016 8:00 AM Medical Record Number: [id number] Patient Account Number: [id number] Date of Birth/Sex: [birth year] (55 y.o. Female) Treating RN: Carolyne Fiscal, Debi Primary Care Physician: Lavera Guise Other Clinician: Referring Physician: Lavera Guise Treating Physician/Extender: Melburn Hake, HOYT Weeks in Treatment: 2 Wound Status Wound Number: 5 Primary Diabetic Wound/Ulcer of the Lower Etiology: Extremity Wound Location: Posterior Knee Wound Status: Open Wounding Event: Gradually Appeared Comorbid Asthma, Hypertension, Type II Date Acquired: 07/15/2016 History: Diabetes, Neuropathy Weeks Of Treatment: 2 Clustered Wound: No Photos Photo Uploaded By: Alric Quan on 10/05/2016 11:26:29 Wound Measurements Length: (cm) 0.5 Width: (cm) 0.5 Depth: (cm) 15 Area: (cm) 0.196 Volume: (cm) 2.945 % Reduction in Area: 60.4% % Reduction in Volume: 60.3% Epithelialization: None Tunneling: No Undermining: No Wound Description Classification:  Grade 2 Wound Margin: Flat and Intact Exudate Amount: Large Exudate Type: Serous Exudate Color: amber Foul Odor After Cleansing: No Wound Bed Granulation Amount: Medium (34-66%) Exposed Structure Granulation Quality: Pink Fascia Exposed: No Necrotic Amount: Medium (34-66%) Fat Layer Exposed: Yes Necrotic Quality: Adherent Slough Tendon Exposed: No Crossen, Kaija L. ([id number]) Muscle Exposed: Yes Necrosis of Muscle: No Joint Exposed: No Bone Exposed: No Periwound Skin Texture Texture Color No Abnormalities Noted: No No Abnormalities Noted: No Callus: No Atrophie Blanche: No Crepitus: No Cyanosis: No Excoriation: No Ecchymosis: No Fluctuance: No Erythema: Yes Friable: No Erythema Location: Circumferential Induration: No Hemosiderin Staining: No Localized Edema: Yes Mottled: No Rash: No Pallor: No Scarring: No Rubor: Yes Moisture Temperature / Pain No Abnormalities Noted: No Temperature: Hot Dry / Scaly: No Tenderness on Palpation: Yes Maceration: Yes Moist: Yes Wound Preparation Ulcer Cleansing: Rinsed/Irrigated with Saline Topical Anesthetic Applied: Other: lidocaine 4%, Treatment Notes Wound #5 (Posterior Knee) 1. Cleansed with: Clean wound with Normal Saline 2. Anesthetic Topical Lidocaine 4%  cream to wound bed prior to debridement 3. Peri-wound Care: Skin Prep 4. Dressing Applied: Aquacel Ag 5. Secondary Dressing Applied Bordered Foam Dressing Dry Gauze Electronic Signature(s) Signed: 10/05/2016 4:35:45 PM By: Alric Quan Entered By: Alric Quan on 10/05/2016 09:04:55 Mauritz, [name] ([id number]) -------------------------------------------------------------------------------- Vitals Details Patient Name: [name] Date of Service: 10/05/2016 8:00 AM Medical Record Number: [id number] Patient Account Number: [id number] Date of Birth/Sex: [birth year] (55 y.o. Female) Treating RN: Carolyne Fiscal, Debi Primary Care Physician: Lavera Guise Other Clinician: Referring Physician: Lavera Guise Treating Physician/Extender: Melburn Hake, HOYT Weeks in Treatment: 2 Vital Signs Time Taken: 08:14 Temperature (F): 97.7 Height (in): 63 Pulse (bpm): 82 Weight (lbs): 257 Respiratory Rate (breaths/min): 18 Body Mass Index (BMI): 45.5 Blood Pressure (mmHg): 123/48 Reference Range: 80 - 120 mg / dl Notes Made Hoytt, PA aware of BP. Electronic Signature(s) Signed: 10/05/2016 4:35:45 PM By: Alric Quan Entered By: Alric Quan on 10/05/2016 08:34:07

## 2016-10-06 NOTE — Progress Notes (Signed)
Katelyn, [name] ([id number]) Visit Report for 10/05/2016 Chief Complaint Document Details Patient Name: Katelyn, [name] 10/05/2016 8:00 Date of Service: AM Medical Record [id number] Number: Patient Account Number: [id number] 18-Feb-1961 (55 y.o. Treating RN: Phillis Haggis Date of Birth/Sex: Female) Other Clinician: Primary Care Physician: Joen [name] Treating STONE III, HOYT Referring Physician: Joen [name] Physician/Extender: Weeks in Treatment: 2 Information Obtained from: Patient Chief Complaint Wounds over the right ischial area left gluteal area and on the lateral aspect of the left popliteal fossa Electronic Signature(s) Signed: 10/05/2016 4:52:13 PM By: Lenda Kelp PA-C Entered By: Lenda Kelp on 10/05/2016 09:58:20 Gibby, [name] ([id number]) -------------------------------------------------------------------------------- Debridement Details Patient Name: [name]. 10/05/2016 8:00 Date of Service: AM Medical Record [id number] Number: Patient Account Number: [id number] 05-26-1961 (55 y.o. Treating RN: Phillis Haggis Date of Birth/Sex: Female) Other Clinician: Primary Care Physician: BLISS, [name] Treating STONE III, HOYT Referring Physician: Joen [name] Physician/Extender: Weeks in Treatment: 2 Debridement Performed for Wound #4 Left Gluteal fold Assessment: Performed By: Physician STONE III, HOYT, Debridement: Debridement Pre-procedure Yes - 09:02 Verification/Time Out Taken: Start Time: 09:03 Pain Control: Lidocaine 4% Topical Solution Level: Skin/Subcutaneous Tissue Total Area Debrided (L x 1.5 (cm) x 4.5 (cm) = 6.75 (cm) W): Tissue and other Viable, Non-Viable, Exudate, Fibrin/Slough, Subcutaneous material debrided: Instrument: Curette Bleeding: Minimum Hemostasis Achieved: Pressure End Time: 09:05 Procedural Pain: 0 Post Procedural Pain: 0 Response to Treatment: Procedure was tolerated well Post Debridement Measurements of Total  Wound Length: (cm) 1.5 Stage: Category/Stage IV Width: (cm) 4.5 Depth: (cm) 0.2 Volume: (cm) 1.06 Character of Wound/Ulcer Post Requires Further Debridement: Debridement Severity of Tissue Post Fat layer exposed Debridement: Post Procedure Diagnosis Same as Pre-procedure Electronic Signature(s) Signed: 10/05/2016 9:39:47 AM By: Nada Maclachlan, Jassmin L. ([id number]) Signed: 10/05/2016 4:35:45 PM By: Alejandro Mulling Entered By: Alejandro Mulling on 10/05/2016 09:04:06 Lemaire, [name] ([id number]) -------------------------------------------------------------------------------- Debridement Details Patient Name: [name], [name]. 10/05/2016 8:00 Date of Service: AM Medical Record [id number] Number: Patient Account Number: [id number] Sep 30, 1961 (55 y.o. Treating RN: Phillis Haggis Date of Birth/Sex: Female) Other Clinician: Primary Care Physician: BLISS, [name] Treating STONE III, HOYT Referring Physician: Joen [name] Physician/Extender: Weeks in Treatment: 2 Debridement Performed for Wound #3 Right Gluteal fold Assessment: Performed By: Physician STONE III, HOYT, Debridement: Debridement Pre-procedure Yes - 09:02 Verification/Time Out Taken: Start Time: 09:05 Pain Control: Lidocaine 4% Topical Solution Level: Skin/Subcutaneous Tissue Total Area Debrided (L x 1.8 (cm) x 1.2 (cm) = 2.16 (cm) W): Tissue and other Viable, Non-Viable, Exudate, Fibrin/Slough, Subcutaneous material debrided: Instrument: Curette Bleeding: Minimum Hemostasis Achieved: Pressure End Time: 09:08 Procedural Pain: 0 Post Procedural Pain: 0 Response to Treatment: Procedure was tolerated well Post Debridement Measurements of Total Wound Length: (cm) 1.8 Stage: Category/Stage IV Width: (cm) 1.2 Depth: (cm) 5 Volume: (cm) 8.482 Character of Wound/Ulcer Post Requires Further Debridement: Debridement Severity of Tissue Post Fat layer exposed Debridement: Post Procedure  Diagnosis Same as Pre-procedure Electronic Signature(s) Signed: 10/05/2016 9:39:47 AM By: Nada Maclachlan, Damaria L. ([id number]) Signed: 10/05/2016 4:35:45 PM By: Alejandro Mulling Entered By: Alejandro Mulling on 10/05/2016 09:04:46 Gatliff, [name] ([id number]) -------------------------------------------------------------------------------- HPI Details Patient Name: [name], [name]. 10/05/2016 8:00 Date of Service: AM Medical Record [id number] Number: Patient Account Number: [id number] 15-Nov-1961 (55 y.o. Treating RN: Phillis Haggis Date of Birth/Sex: Female) Other Clinician: Primary Care Physician: Joen [name] Treating STONE III, HOYT Referring Physician: Joen [name] Physician/Extender: Weeks in Treatment: 2 History of Present Illness Location: bilateral gluteal ulcerations and  left popliteal ulceration with tunneling Quality: Patient reports experiencing a dull pain to affected area(s). Severity: Patient states wound are getting worse. Duration: Patient has had the wound for < 2 weeks prior to presenting for treatment Timing: The pain is intermittent in severity as far as how intense it becomes but is present all the time. Manipulationn makes this worse. Context: The wound occurred when the patient had a fall and was unconscious for about 48 hours laying on the floor and she had pressure injury at that stage. Modifying Factors: Other treatment(s) tried include:as noted below she has been seen by visiting wound care physicians or nurse practitioners and details have been noted Associated Signs and Symptoms: Patient reports having difficulty standing for long periods. HPI Description: 55 year old patient who was seen by visiting [name] wound care specialist for a wound on both her buttock and was found to have an unstageable wound on the right buttock for about 2 months. I understand that she had a fall and was laying on the floor for about 48 hours before she was found  and taken to the ICU and had a long injury to her gluteal area from pressure and also had broken her right humerus. She has had a right proximal humerus fracture and has been followed up with orthopedics recently. The patient has a past medical history of type 2 diabetes mellitus, paraparesis, acute pyelonephritis, GERD, hypertension, glaucoma, chronic pain, anxiety neurosis, nicotine dependence, COPD. the patient had some debridement done and was to operative was recommended to use Silvadene dressing and offloading. She is a smoker and occasionally smokes a few cigarettes. the patient requested a second opinion for months and is here to discuss her care. 09/21/16; the patient re-presents from home today for review of 3 different wounds. I note that she was seen in the clinic here in July at which time she had bilateral buttock wounds. It was apparently suggested at that time that she use a wound VAC bridged to both wounds just near the initial tuberosity's bilaterally which she refused. The history was a bit difficult to put together. Apparently this patient became ill at the end of April of this year. She was found sitting on the floor she had apparently been for 2 days and subsequently admitted to hospital from 04/23/16 through 05/02/16 and at that point she was critically ill ultimately having sepsis secondary to UTI, nontraumatic rhabdomyolysis and diabetic ketoacidosis. She had acute renal failure and I think required ICU care including intubation. Patient states her wounds actually started at that point on the bilateral issue tuberosities however in reviewing the discharge summary from 5/8 I see no reference to wounds at that point. It did state that she had left lower extremity cellulitis however. Reviewing Epic I see no relevant x-rays. It would appear that her discharge creatinine was within the normal range and indeed on 9/15 her creatinine has remained normal. [facility] SHARON, RUBIS. ([id number]) rehabilitation. There the wounds on her bilateral Botox were dressed. Only just before her discharge from the nursing facility she developed an "knot" which was interpreted as cellulitis on the posterior aspect of her left knee she was given antibiotics. Apparently sometime late in July a this actually opened and became a wound at home health care was tending to however she is still having purulent drainage coming from this and by my understanding the wound depth is actually become unmeasurable. I am not really clear about what  home health has been placing in any of these wound areas. The patient states that is something with silver and it. She is not been systemically unwell no fever or chills her appetite is good. She is a diabetic poorly controlled however she states that her recent blood sugars at home have been in the low to mid 100s. 09/28/16 On evaluation today patient appears to continue to exhibit the 3 areas of ulceration that were noted previous. She did have an x-ray of the right pelvis which showed evidence of potential soft tissue infection but no obvious osteomyelitis. There was a discussion last office visit concerning the possibility of a wound VAC. Witth that being said the x-ray report suggested that an MRI may be more appropriate to further evaluate the extent. Subsequently in regard to the wound over the popliteal portion of the left lower extremity with tunneling at 12:00 the CT scan that was ordered was denied by insurance as they state the patient has not had x-rays prior to advanced imaging. Patient states that she is frustrated with the situation overall. 10/05/16 in the interval since I last saw this patient last week she has had the x-ray of the knee performed. I did review that x-ray today and fortunately shows no evidence of osteomyelitis or other acute abnormality at this point in time. She continues to have  the opening iin the posterior oral popliteal space with tracking proximal up the posterior thigh. Nothing seems to have worsened but it also seems to have not improved. The same is true in regard to the right pressure ulcer over the gluteal region which extends toward the ischium. The left gluteal pressure ulcer actually appears to be doing somewhat better my opinion there is some necrotic slough but overall this appears fairly well. She tells me thatt she has some discomfort especially when home health is helping her with dressing changes as they do not know her. At worse she rates her pain to be a 5 out of 10 right now it's more like a 1 out of 10. Electronic Signature(s) Signed: 10/05/2016 4:52:13 PM By: Lenda Kelp PA-C Entered By: Lenda Kelp on 10/05/2016 10:00:07 Shehata, [name] ([id number]) -------------------------------------------------------------------------------- Physical Exam Details Patient Name: ELIANYS, [name] 10/05/2016 8:00 Date of Service: AM Medical Record [id number] Number: Patient Account Number: [id number] 1961-01-31 (55 y.o. Treating RN: Phillis Haggis Date of Birth/Sex: Female) Other Clinician: Primary Care Physician: Joen [name] Treating STONE III, HOYT Referring Physician: Joen [name] Physician/Extender: Weeks in Treatment: 2 Constitutional Well-nourished and well-hydrated in no acute distress. Respiratory normal breathing without difficulty. Psychiatric this patient is able to make decisions and demonstrates good insight into disease process. Alert and Oriented x 3. pleasant and cooperative. Notes The wound over the region of patient's left popliteal spacee still tracks proximally up the posterior thigh. In regard to the right gluteal wound this also continues to track deeply and toward the ischium. Electronic Signature(s) Signed: 10/05/2016 4:52:13 PM By: Lenda Kelp PA-C Entered By: Lenda Kelp on 10/05/2016 10:01:07 Gibbs, [name] ([id number]) -------------------------------------------------------------------------------- Physician Orders Details Patient Name: ANGLEA, GORDNER 10/05/2016 8:00 Date of Service: AM Medical Record [id number] Number: Patient Account Number: [id number] 05-08-1961 (55 y.o. Treating RN: Phillis Haggis Date of Birth/Sex: Female) Other Clinician: Primary Care Physician: BLISS, [name] Treating STONE III, HOYT Referring Physician: Joen [name] Physician/Extender: Tania Ade in Treatment: 2 Verbal / Phone Orders: Yes ClinicianAshok Cordia, Debi Read Back and Verified: Yes Diagnosis Coding Wound Cleansing Wound #3 Right Gluteal  fold o Clean wound with Normal Saline. Wound #4 Left Gluteal fold o Clean wound with Normal Saline. Wound #5 Posterior Knee o Clean wound with Normal Saline. Anesthetic Wound #3 Right Gluteal fold o Topical Lidocaine 4% cream applied to wound bed prior to debridement - clinic use only Wound #4 Left Gluteal fold o Topical Lidocaine 4% cream applied to wound bed prior to debridement - clinic use only Wound #5 Posterior Knee o Topical Lidocaine 4% cream applied to wound bed prior to debridement - clinic use only Skin Barriers/Peri-Wound Care Wound #3 Right Gluteal fold o Skin Prep Wound #4 Left Gluteal fold o Skin Prep Wound #5 Posterior Knee o Skin Prep Primary Wound Dressing Wound #3 Right Gluteal fold o Aquacel Ag - Silver Alginate Rope pack lightly into wound and leave a tail where it can be pulled out and to cover the outside of the wound. Maiden, Jazz L. ([id number]) Wound #4 Left Gluteal fold o Aquacel Ag - Silver Alginate Wound #5 Posterior Knee o Aquacel Ag - Silver Alginate Rope pack lightly into wound and leave a tail where it can be pulled out and to cover the outside of the wound. Secondary Dressing Wound #3 Right Gluteal fold o Dry Gauze o Boardered Foam Dressing Wound #4 Left Gluteal fold o Dry Gauze o  Boardered Foam Dressing Wound #5 Posterior Knee o Dry Gauze o Boardered Foam Dressing Dressing Change Frequency Wound #3 Right Gluteal fold o Change dressing every other day. Wound #4 Left Gluteal fold o Change dressing every other day. Wound #5 Posterior Knee o Change dressing every other day. Follow-up Appointments Wound #3 Right Gluteal fold o Return Appointment in 1 week. Wound #4 Left Gluteal fold o Return Appointment in 1 week. Wound #5 Posterior Knee o Return Appointment in 1 week. Off-Loading Wound #3 Right Gluteal fold o Turn and reposition every 2 hours Wound #4 Left Gluteal fold o Turn and reposition every 2 hours Doshi, Samanta L. ([id number]) Wound #5 Posterior Knee o Turn and reposition every 2 hours Home Health Wound #3 Right Gluteal fold o [facility] o Home Health Nurse may visit PRN to address patientos wound care needs. o FACE TO FACE ENCOUNTER: MEDICARE and MEDICAID PATIENTS: I certify that this patient is under my care and that I had a face-to-face encounter that meets the physician face-to-face encounter requirements with this patient on this date. The encounter with the patient was in whole or in part for the following MEDICAL CONDITION: (primary reason for Home Healthcare) MEDICAL NECESSITY: I certify, that based on my findings, NURSING services are a medically necessary home health service. HOME BOUND STATUS: I certify that my clinical findings support that this patient is homebound (i.e., Due to illness or injury, pt requires aid of supportive devices such as crutches, cane, wheelchairs, walkers, the use of special transportation or the assistance of another person to leave their place of residence. There is a normal inability to leave the home and doing so requires considerable and taxing effort. Other absences are for medical reasons / religious services and are infrequent or of  short duration when for other reasons). o If current dressing causes regression in wound condition, may D/C ordered dressing product/s and apply Normal Saline Moist Dressing daily until next Wound Healing Center / Other MD appointment. Notify Wound Healing Center of regression in wound condition at [phone]. o Please direct any NON-WOUND related issues/requests for orders to patient's Primary Care Physician Wound #  4 Left Gluteal fold o Continue [facility] Health Visits - [facility] Nurse may visit PRN to address patientos wound care needs. o FACE TO FACE ENCOUNTER: MEDICARE and MEDICAID PATIENTS: I certify that this patient is under my care and that I had a face-to-face encounter that meets the physician face-to-face encounter requirements with this patient on this date. The encounter with the patient was in whole or in part for the following MEDICAL CONDITION: (primary reason for Home Healthcare) MEDICAL NECESSITY: I certify, that based on my findings, NURSING services are a medically necessary home health service. HOME BOUND STATUS: I certify that my clinical findings support that this patient is homebound (i.e., Due to illness or injury, pt requires aid of supportive devices such as crutches, cane, wheelchairs, walkers, the use of special transportation or the assistance of another person to leave their place of residence. There is a normal inability to leave the home and doing so requires considerable and taxing effort. Other absences are for medical reasons / religious services and are infrequent or of short duration when for other reasons). o If current dressing causes regression in wound condition, may D/C ordered dressing product/s and apply Normal Saline Moist Dressing daily until next Wound Healing Center / Other MD appointment. Notify Wound Healing Center of regression in wound condition at [phone]. o Please direct any NON-WOUND related  issues/requests for orders to patient's Primary Care Physician Wound #5 Posterior Knee o Continue [facility] Health Visits - [facility] Nurse may visit PRN to address patientos wound care needs. SEANA, UNDERWOOD ([id number]) o FACE TO FACE ENCOUNTER: MEDICARE and MEDICAID PATIENTS: I certify that this patient is under my care and that I had a face-to-face encounter that meets the physician face-to-face encounter requirements with this patient on this date. The encounter with the patient was in whole or in part for the following MEDICAL CONDITION: (primary reason for Home Healthcare) MEDICAL NECESSITY: I certify, that based on my findings, NURSING services are a medically necessary home health service. HOME BOUND STATUS: I certify that my clinical findings support that this patient is homebound (i.e., Due to illness or injury, pt requires aid of supportive devices such as crutches, cane, wheelchairs, walkers, the use of special transportation or the assistance of another person to leave their place of residence. There is a normal inability to leave the home and doing so requires considerable and taxing effort. Other absences are for medical reasons / religious services and are infrequent or of short duration when for other reasons). o If current dressing causes regression in wound condition, may D/C ordered dressing product/s and apply Normal Saline Moist Dressing daily until next Wound Healing Center / Other MD appointment. Notify Wound Healing Center of regression in wound condition at [phone]. o Please direct any NON-WOUND related issues/requests for orders to patient's Primary Care Physician Medications-please add to medication list. Wound #3 Right Gluteal fold o Other: - Vitamin C, Zinc, Multivitamin Wound #4 Left Gluteal fold o Other: - Vitamin C, Zinc, Multivitamin Wound #5 Posterior Knee o Other: - Vitamin C, Zinc,  Multivitamin Radiology o Magnetic Resonance Imaging (MRI) - Pelvis, posterior left knee/thigh Electronic Signature(s) Signed: 10/05/2016 9:39:47 AM By: Lenda Kelp PA-C Signed: 10/05/2016 4:35:45 PM By: Alejandro Mulling Entered By: Alejandro Mulling on 10/05/2016 09:07:32 Hoben, [name] ([id number]) -------------------------------------------------------------------------------- Problem List Details Patient Name: [name], [name]. 10/05/2016 8:00 Date of Service: AM Medical Record [id number] Number: Patient Account Number:  [id number] 01/28/1961 (55 y.o. 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 10/05/2016 8:00 Date of Service: AM Medical Record [id number] Number: Patient Account Number: [id number] 22-Nov-1961 (55 y.o. Treating RN: Phillis Haggis Date of Birth/Sex: Female) Other Clinician: Primary Care Physician: BLISS, [name] Treating STONE III, HOYT Referring Physician: Joen [name] Physician/Extender: Weeks in Treatment: 2 Subjective Chief Complaint Information obtained from Patient Wounds over the right ischial area left  gluteal area and on the lateral aspect of the left popliteal fossa History of Present Illness (HPI) The following HPI elements were documented for the patient's wound: Location: bilateral gluteal ulcerations and left popliteal ulceration with tunneling Quality: Patient reports experiencing a dull pain to affected area(s). Severity: Patient states wound are getting worse. Duration: Patient has had the wound for < 2 weeks prior to presenting for treatment Timing: The pain is intermittent in severity as far as how intense it becomes but is present all the time. Manipulationn makes this worse. Context: The wound occurred when the patient had a fall and was unconscious for about 48 hours laying on the floor and she had pressure injury at that stage. Modifying Factors: Other treatment(s) tried include:as noted below she has been seen by visiting wound care physicians or nurse practitioners and details have been noted Associated Signs and Symptoms: Patient reports having difficulty standing for long periods. 55 year old patient who was seen by visiting [name] wound care specialist for a wound on both her buttock and was found to have an unstageable wound on the right buttock for about 2 months. I understand that she had a fall and was laying on the floor for about 48 hours before she was found and taken to the ICU and had a long injury to her gluteal area from pressure and also had broken her right humerus. She has had a right proximal humerus fracture and has been followed up with orthopedics recently. The patient has a past medical history of type 2 diabetes mellitus, paraparesis, acute pyelonephritis, GERD, hypertension, glaucoma, chronic pain, anxiety neurosis, nicotine dependence, COPD. the patient had some debridement done and was to operative was recommended to use Silvadene dressing and offloading. She is a smoker and occasionally smokes a few cigarettes. the patient requested a second  opinion for months and is here to discuss her care. 09/21/16; the patient re-presents from home today for review of 3 different wounds. I note that she was seen in the clinic here in July at which time she had bilateral buttock wounds. It was apparently suggested at that time that she use a wound VAC bridged to both wounds just near the initial tuberosity's bilaterally which she refused. The history was a bit difficult to put together. Apparently this patient became ill at the end of April of this SELEEN, WALTER. ([id number]) year. She was found sitting on the floor she had apparently been for 2 days  and subsequently admitted to hospital from 04/23/16 through 05/02/16 and at that point she was critically ill ultimately having sepsis secondary to UTI, nontraumatic rhabdomyolysis and diabetic ketoacidosis. She had acute renal failure and I think required ICU care including intubation. Patient states her wounds actually started at that point on the bilateral issue tuberosities however in reviewing the discharge summary from 5/8 I see no reference to wounds at that point. It did state that she had left lower extremity cellulitis however. Reviewing Epic I see no relevant x-rays. It would appear that her discharge creatinine was within the normal range and indeed on 9/15 her creatinine has remained normal. [facility] There the wounds on her bilateral Botox were dressed. Only just before her discharge from the nursing facility she developed an "knot" which was interpreted as cellulitis on the posterior aspect of her left knee she was given antibiotics. Apparently sometime late in July a this actually opened and became a wound at home health care was tending to however she is still having purulent drainage coming from this and by my understanding the wound depth is actually become unmeasurable. I am not really clear about what home health has been  placing in any of these wound areas. The patient states that is something with silver and it. She is not been systemically unwell no fever or chills her appetite is good. She is a diabetic poorly controlled however she states that her recent blood sugars at home have been in the low to mid 100s. 09/28/16 On evaluation today patient appears to continue to exhibit the 3 areas of ulceration that were noted previous. She did have an x-ray of the right pelvis which showed evidence of potential soft tissue infection but no obvious osteomyelitis. There was a discussion last office visit concerning the possibility of a wound VAC. Witth that being said the x-ray report suggested that an MRI may be more appropriate to further evaluate the extent. Subsequently in regard to the wound over the popliteal portion of the left lower extremity with tunneling at 12:00 the CT scan that was ordered was denied by insurance as they state the patient has not had x-rays prior to advanced imaging. Patient states that she is frustrated with the situation overall. 10/05/16 in the interval since I last saw this patient last week she has had the x-ray of the knee performed. I did review that x-ray today and fortunately shows no evidence of osteomyelitis or other acute abnormality at this point in time. She continues to have the opening iin the posterior oral popliteal space with tracking proximal up the posterior thigh. Nothing seems to have worsened but it also seems to have not improved. The same is true in regard to the right pressure ulcer over the gluteal region which extends toward the ischium. The left gluteal pressure ulcer actually appears to be doing somewhat better my opinion there is some necrotic slough but overall this appears fairly well. She tells me thatt she has some discomfort especially when home health is helping her with dressing changes as they do not know her. At worse she rates her pain to be a 5 out  of 10 right now it's more like a 1 out of 10. Objective Constitutional Well-nourished and well-hydrated in no acute distress. Vitals Time Taken: 8:14 AM, Height: 63 in, Weight: 257 lbs, BMI: 45.5, Temperature: 97.7 F, Pulse: 82 bpm, Respiratory Rate: 18 breaths/min, Blood Pressure: 123/48 mmHg. General Notes:  Made Hoytt, PA aware of BP. Laskin, Larita L. ([id number]) Respiratory normal breathing without difficulty. Psychiatric this patient is able to make decisions and demonstrates good insight into disease process. Alert and Oriented x 3. pleasant and cooperative. General Notes: The wound over the region of patient's left popliteal spacee still tracks proximally up the posterior thigh. In regard to the right gluteal wound this also continues to track deeply and toward the ischium. Integumentary (Hair, Skin) Wound #3 status is Open. Original cause of wound was Pressure Injury. The wound is located on the Right Gluteal fold. The wound measures 1.8cm length x 1.2cm width x 5cm depth; 1.696cm^2 area and 8.482cm^3 volume. There is muscle, fat, and fascia exposed. There is no tunneling or undermining noted. There is a large amount of serous drainage noted. The wound margin is distinct with the outline attached to the wound base. There is large (67-100%) pink, pale granulation within the wound bed. There is a small (1-33%) amount of necrotic tissue within the wound bed including Adherent Slough. The periwound skin appearance exhibited: Maceration, Moist. Periwound temperature was noted as No Abnormality. The periwound has tenderness on palpation. Wound #4 status is Open. Original cause of wound was Pressure Injury. The wound is located on the Left Gluteal fold. The wound measures 1.5cm length x 4.5cm width x 0.1cm depth; 5.301cm^2 area and 0.53cm^3 volume. There is muscle and fat exposed. There is no tunneling or undermining noted. There is a large amount of serous drainage noted. The wound  margin is distinct with the outline attached to the wound base. There is large (67-100%) red, pink granulation within the wound bed. There is a small (1-33%) amount of necrotic tissue within the wound bed including Adherent Slough. The periwound skin appearance exhibited: Maceration, Moist. Periwound temperature was noted as No Abnormality. The periwound has tenderness on palpation. Wound #5 status is Open. Original cause of wound was Gradually Appeared. The wound is located on the Posterior Knee. The wound measures 0.5cm length x 0.5cm width x 15cm depth; 0.196cm^2 area and 2.945cm^3 volume. There is muscle and fat exposed. There is no tunneling or undermining noted. There is a large amount of serous drainage noted. The wound margin is flat and intact. There is medium (34-66%) pink granulation within the wound bed. There is a medium (34-66%) amount of necrotic tissue within the wound bed including Adherent Slough. The periwound skin appearance exhibited: Localized Edema, Maceration, Moist, Rubor, Erythema. The periwound skin appearance did not exhibit: Callus, Crepitus, Excoriation, Fluctuance, Friable, Induration, Rash, Scarring, Dry/Scaly, Atrophie Blanche, Cyanosis, Ecchymosis, Hemosiderin Staining, Mottled, Pallor. The surrounding wound skin color is noted with erythema which is circumferential. Periwound temperature was noted as Hot. The periwound has tenderness on palpation. Assessment Whitner, Vern L. 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 There was a Skin/Subcutaneous Tissue Debridement ([id number]) debridement with total area of 2.16 sq cm performed by STONE III,  HOYT. with the following instrument(s): Curette to remove Viable and Non-Viable tissue/material including Exudate, Fibrin/Slough, and Subcutaneous after achieving pain control using Lidocaine 4% Topical Solution. A time out was conducted at 09:02, prior to the start of the procedure. A Minimum amount of bleeding was controlled with Pressure. The procedure  was tolerated well with a pain level of 0 throughout and a pain level of 0 following the procedure. Post Debridement Measurements: 1.8cm length x 1.2cm width x 5cm depth; 8.482cm^3 volume. Post debridement Stage noted as Category/Stage IV. Character of Wound/Ulcer Post Debridement requires further debridement. Severity of Tissue Post Debridement is: Fat layer exposed. Post procedure Diagnosis Wound #3: Same as Pre-Procedure Wound #4 Wound #4 is a Pressure Ulcer located on the Left Gluteal fold . There was a Skin/Subcutaneous Tissue Debridement ([id number]) debridement with total area of 6.75 sq cm performed by STONE III, HOYT. with the following instrument(s): Curette to remove Viable and Non-Viable tissue/material including Exudate, Fibrin/Slough, and Subcutaneous after achieving pain control using Lidocaine 4% Topical Solution. A time out was conducted at 09:02, prior to the start of the procedure. A Minimum amount of bleeding was controlled with Pressure. The procedure was tolerated well with a pain level of 0 throughout and a pain level of 0 following the procedure. Post Debridement Measurements: 1.5cm length x 4.5cm width x 0.2cm depth; 1.06cm^3 volume. Post debridement Stage noted as Category/Stage IV. Character of Wound/Ulcer Post Debridement requires further debridement. Severity of Tissue Post Debridement is: Fat layer exposed. Post procedure Diagnosis Wound #4: Same as Pre-Procedure Plan Wound Cleansing: Krenz, Mazi L. ([id number]) Wound #3 Right Gluteal fold: Clean wound with Normal Saline. Wound #4 Left Gluteal fold: Clean  wound with Normal Saline. Wound #5 Posterior Knee: Clean wound with Normal Saline. Anesthetic: Wound #3 Right Gluteal fold: Topical Lidocaine 4% cream applied to wound bed prior to debridement - clinic use only Wound #4 Left Gluteal fold: Topical Lidocaine 4% cream applied to wound bed prior to debridement - clinic use only Wound #5 Posterior Knee: Topical Lidocaine 4% cream applied to wound bed prior to debridement - clinic use only Skin Barriers/Peri-Wound Care: Wound #3 Right Gluteal fold: Skin Prep Wound #4 Left Gluteal fold: Skin Prep Wound #5 Posterior Knee: Skin Prep Primary Wound Dressing: Wound #3 Right Gluteal fold: Aquacel Ag - Silver Alginate Rope pack lightly into wound and leave a tail where it can be pulled out and to cover the outside of the wound. Wound #4 Left Gluteal fold: Aquacel Ag - Silver Alginate Wound #5 Posterior Knee: Aquacel Ag - Silver Alginate Rope pack lightly into wound and leave a tail where it can be pulled out and to cover the outside of the wound. Secondary Dressing: Wound #3 Right Gluteal fold: Dry Gauze Boardered Foam Dressing Wound #4 Left Gluteal fold: Dry Gauze Boardered Foam Dressing Wound #5 Posterior Knee: Dry Gauze Boardered Foam Dressing Dressing Change Frequency: Wound #3 Right Gluteal fold: Change dressing every other day. Wound #4 Left Gluteal fold: Change dressing every other day. Wound #5 Posterior Knee: Change dressing every other day. Follow-up Appointments: Wound #3 Right Gluteal fold: Return Appointment in 1 week. Wound #4 Left Gluteal fold: Return Appointment in 1 week. Sharp, Angee L. ([id number]) Wound #5 Posterior Knee: Return Appointment in 1 week. Off-Loading: Wound #3 Right Gluteal fold: Turn and reposition every 2 hours Wound #4 Left Gluteal fold: Turn and reposition every 2 hours Wound #5 Posterior Knee: Turn and reposition every 2 hours Home Health: Wound #3 Right Gluteal fold: Continue Home  Health Visits - [facility] Nurse may visit PRN to address patient s wound care needs. FACE TO FACE ENCOUNTER: MEDICARE and MEDICAID PATIENTS: I certify that this patient is under my care and that I had a face-to-face encounter that meets the physician  face-to-face encounter requirements with this patient on this date. The encounter with the patient was in whole or in part for the following MEDICAL CONDITION: (primary reason for Home Healthcare) MEDICAL NECESSITY: I certify, that based on my findings, NURSING services are a medically necessary home health service. HOME BOUND STATUS: I certify that my clinical findings support that this patient is homebound (i.e., Due to illness or injury, pt requires aid of supportive devices such as crutches, cane, wheelchairs, walkers, the use of special transportation or the assistance of another person to leave their place of residence. There is a normal inability to leave the home and doing so requires considerable and taxing effort. Other absences are for medical reasons / religious services and are infrequent or of short duration when for other reasons). If current dressing causes regression in wound condition, may D/C ordered dressing product/s and apply Normal Saline Moist Dressing daily until next Wound Healing Center / Other MD appointment. Notify Wound Healing Center of regression in wound condition at [phone]. Please direct any NON-WOUND related issues/requests for orders to patient's Primary Care Physician Wound #4 Left Gluteal fold: Continue Home Health Visits - [facility] Nurse may visit PRN to address patient s wound care needs. FACE TO FACE ENCOUNTER: MEDICARE and MEDICAID PATIENTS: I certify that this patient is under my care and that I had a face-to-face encounter that meets the physician face-to-face encounter requirements with this patient on this date. The encounter with the patient  was in whole or in part for the following MEDICAL CONDITION: (primary reason for Home Healthcare) MEDICAL NECESSITY: I certify, that based on my findings, NURSING services are a medically necessary home health service. HOME BOUND STATUS: I certify that my clinical findings support that this patient is homebound (i.e., Due to illness or injury, pt requires aid of supportive devices such as crutches, cane, wheelchairs, walkers, the use of special transportation or the assistance of another person to leave their place of residence. There is a normal inability to leave the home and doing so requires considerable and taxing effort. Other absences are for medical reasons / religious services and are infrequent or of short duration when for other reasons). If current dressing causes regression in wound condition, may D/C ordered dressing product/s and apply Normal Saline Moist Dressing daily until next Wound Healing Center / Other MD appointment. Notify Wound Healing Center of regression in wound condition at [phone]. Please direct any NON-WOUND related issues/requests for orders to patient's Primary Care Physician Wound #5 Posterior Knee: Continue Home Health Visits - [facility] Nurse may visit PRN to address patient s wound care needs. FACE TO FACE ENCOUNTER: MEDICARE and MEDICAID PATIENTS: I certify that this patient is under my care and that I had a face-to-face encounter that meets the physician face-to-face encounter requirements with this patient on this date. The encounter with the patient was in whole or in part for the following MEDICAL CONDITION: (primary reason for Home Healthcare) MEDICAL NECESSITY: I certify, Buonomo, Terilyn L. ([id number]) that based on my findings, NURSING services are a medically necessary home health service. HOME BOUND STATUS: I certify that my clinical findings support that this patient is homebound (i.e., Due to illness or injury, pt  requires aid of supportive devices such as crutches, cane, wheelchairs, walkers, the use of special transportation or the assistance of another person to leave their place of residence. There is a normal inability to leave the home and doing  so requires considerable and taxing effort. Other absences are for medical reasons / religious services and are infrequent or of short duration when for other reasons). If current dressing causes regression in wound condition, may D/C ordered dressing product/s and apply Normal Saline Moist Dressing daily until next Wound Healing Center / Other MD appointment. Notify Wound Healing Center of regression in wound condition at [phone]. Please direct any NON-WOUND related issues/requests for orders to patient's Primary Care Physician Medications-please add to medication list.: Wound #3 Right Gluteal fold: Other: - Vitamin C, Zinc, Multivitamin Wound #4 Left Gluteal fold: Other: - Vitamin C, Zinc, Multivitamin Wound #5 Posterior Knee: Other: - Vitamin C, Zinc, Multivitamin Radiology ordered were: Magnetic Resonance Imaging (MRI) - Pelvis, posterior left knee/thigh Follow-Up Appointments: A follow-up appointment should be scheduled. Medication Reconciliation completed and provided to Patient/Care Provider. A Patient Clinical Summary of Care was provided to TW At this point in time I'm going to go ahead and recommend that we continue with the same dressing changes at this point in time. This includes Aquasol AG packed into the right gluteal wound and then covered with Aquacel Ag and the same in regard to the left gluteal wound. In regard to the posterior knee on the left on the recommend that we packed with a silver alginate rope lightly into the wound as we have been doing. We will see her for reevaluation in one week. However in respect to screening and ensuring there is no osteomyelitis on the recommended MRI of the pelvis as well as an MRI of the left  knee. Both orderrs were placed today. If there is no osteomyelitis especially in regard to the pelvis been she is considering and wanting to look into proceeding with the wound VAC. Otherwise all questions were encouraged and answered possibility we will see her for reevaluation in one week. Electronic Signature(s) Signed: 10/05/2016 4:52:13 PM By: Lenda Kelp PA-C Entered By: Lenda Kelp on 10/05/2016 10:03:37 Tworek, [name] ([id number]) -------------------------------------------------------------------------------- SuperBill Details Patient Name: [name] Date of Service: 10/05/2016 Medical Record Number: [id number] Patient Account Number: [id number] Date of Birth/Sex: [birth year] (55 y.o. 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 Facility Procedures CPT4 Code Description: [id number] 11042 - DEB SUBQ TISSUE 20 SQ CM/< ICD-10 Description Diagnosis L89.323 Pressure ulcer of left buttock, stage 3 L89.314 Pressure ulcer of right buttock, stage 4 L97.129 Non-pressure chronic ulcer of left thigh with unspeci  E11.42 Type 2 diabetes mellitus with diabetic polyneuropathy Modifier: fied severi Quantity: 1 ty Physician Procedures CPT4 Code Description: [id number] 11042 - WC PHYS SUBQ TISS 20 SQ CM ICD-10 Description Diagnosis L89.323 Pressure ulcer of left buttock, stage 3 L89.314 Pressure ulcer of right buttock, stage 4 L97.129 Non-pressure chronic ulcer of left thigh with unspeci  E11.42 Type 2 diabetes mellitus with diabetic polyneuropathy Modifier: fied  severit Quantity: 1 y Psychologist, prison and probation services) Signed: 10/05/2016 4:52:13 PM By: Lenda Kelp PA-C Entered By: Lenda Kelp on 10/05/2016 10:04:04

## 2016-10-12 ENCOUNTER — Encounter: Payer: Managed Care, Other (non HMO) | Admitting: Internal Medicine

## 2016-10-12 DIAGNOSIS — L89323 Pressure ulcer of left buttock, stage 3: Secondary | ICD-10-CM | POA: Diagnosis not present

## 2016-10-13 NOTE — Progress Notes (Signed)
Katelyn, [name] ([id number]) Visit Report for 10/12/2016 Chief Complaint Document Details Patient Name: [name], [name] Date of Service: 10/12/2016 8:45 AM Medical Record Patient Account Number: [id number] Number: Treating RN: Phillis Haggis May 04, 1961 (55 y.o. Other Clinician: Date of Birth/Sex: Female) Treating Jendaya Gossett Primary Care Physician/Extender: Leticia Penna Physician: Referring Physician: Tedra Senegal in Treatment: 3 Information Obtained from: Patient Chief Complaint Wounds over the right ischial area left gluteal area and on the lateral aspect of the left popliteal fossa Electronic Signature(s) Signed: 10/12/2016 5:07:12 PM By: Baltazar Najjar MD Entered By: Baltazar Najjar on 10/12/2016 10:50:32 Oriley, [name] ([id number]) -------------------------------------------------------------------------------- Debridement Details Patient Name: [name] Date of Service: 10/12/2016 8:45 AM Medical Record Patient Account Number: [id number] Number: Treating RN: Phillis Haggis 08/22/1961 (55 y.o. Other Clinician: Date of Birth/Sex: Female) Treating Vash Quezada Primary Care Physician/Extender: [name], [name] Physician: Referring Physician: Tedra Senegal in Treatment: 3 Debridement Performed for Wound #4 Left Gluteal fold Assessment: Performed By: Physician Maxwell Caul, MD Debridement: Debridement Pre-procedure Yes - 09:25 Verification/Time Out Taken: Start Time: 09:26 Pain Control: Lidocaine 4% Topical Solution Level: Skin/Subcutaneous Tissue Total Area Debrided (L x 1.6 (cm) x 3.8 (cm) = 6.08 (cm) W): Tissue and other Viable, Non-Viable, Exudate, Fibrin/Slough, Subcutaneous material debrided: Instrument: Curette Bleeding: Minimum Hemostasis Achieved: Pressure End Time: 09:29 Procedural Pain: 0 Post Procedural Pain: 0 Response to Treatment: Procedure was tolerated well Post Debridement Measurements of Total  Wound Length: (cm) 1.6 Stage: Category/Stage IV Width: (cm) 3.8 Depth: (cm) 0.1 Volume: (cm) 0.478 Character of Wound/Ulcer Post Stable Debridement: Severity of Tissue Post Fat layer exposed Debridement: Post Procedure Diagnosis Same as Pre-procedure Electronic Signature(s) XANTHE, COUILLARD ([id number]) Signed: 10/12/2016 5:07:12 PM By: Baltazar Najjar MD Signed: 10/12/2016 5:13:26 PM By: Alejandro Mulling Entered By: Baltazar Najjar on 10/12/2016 10:50:20 Morello, [name] ([id number]) -------------------------------------------------------------------------------- HPI Details Patient Name: [name] Date of Service: 10/12/2016 8:45 AM Medical Record Patient Account Number: [id number] Number: Treating RN: Phillis Haggis Oct 11, 1961 (55 y.o. Other Clinician: Date of Birth/Sex: Female) Treating Giovannie Scerbo Primary Care Physician/Extender: [name], [name] Physician: Referring Physician: Tedra Senegal in Treatment: 3 History of Present Illness Location: bilateral gluteal ulcerations and left popliteal ulceration with tunneling Quality: Patient reports experiencing a dull pain to affected area(s). Severity: Patient states wound are getting worse. Duration: Patient has had the wound for < 2 weeks prior to presenting for treatment Timing: The pain is intermittent in severity as far as how intense it becomes but is present all the time. Manipulationn makes this worse. Context: The wound occurred when the patient had a fall and was unconscious for about 48 hours laying on the floor and she had pressure injury at that stage. Modifying Factors: Other treatment(s) tried include:as noted below she has been seen by visiting wound care physicians or nurse practitioners and details have been noted Associated Signs and Symptoms: Patient reports having difficulty standing for long periods. HPI Description: 55 year old patient who was seen by visiting [name] wound care  specialist for a wound on both her buttock and was found to have an unstageable wound on the right buttock for about 2 months. I understand that she had a fall and was laying on the floor for about 48 hours before she was found and taken to the ICU and had a long injury to her gluteal area from pressure and also had broken her right humerus. She has had a right proximal humerus fracture and has  been followed up with orthopedics recently. The patient has a past medical history of type 2 diabetes mellitus, paraparesis, acute pyelonephritis, GERD, hypertension, glaucoma, chronic pain, anxiety neurosis, nicotine dependence, COPD. the patient had some debridement done and was to operative was recommended to use Silvadene dressing and offloading. She is a smoker and occasionally smokes a few cigarettes. the patient requested a second opinion for months and is here to discuss her care. 09/21/16; the patient re-presents from home today for review of 3 different wounds. I note that she was seen in the clinic here in July at which time she had bilateral buttock wounds. It was apparently suggested at that time that she use a wound VAC bridged to both wounds just near the initial tuberosity's bilaterally which she refused. The history was a bit difficult to put together. Apparently this patient became ill at the end of April of this year. She was found sitting on the floor she had apparently been for 2 days and subsequently admitted to hospital from 04/23/16 through 05/02/16 and at that point she was critically ill ultimately having sepsis secondary to UTI, nontraumatic rhabdomyolysis and diabetic ketoacidosis. She had acute renal failure and I think required ICU care including intubation. Patient states her wounds actually started at that point on the bilateral issue tuberosities however in reviewing the discharge summary from 5/8 I see no reference to wounds at that point. It did state that she had left lower  extremity cellulitis however. Reviewing Epic I see no relevant x-rays. It would appear that her discharge creatinine was within the normal range and indeed on Swickard, Jany L. ([id number]) 9/15 her creatinine has remained normal. [facility] There the wounds on her bilateral Botox were dressed. Only just before her discharge from the nursing facility she developed an "knot" which was interpreted as cellulitis on the posterior aspect of her left knee she was given antibiotics. Apparently sometime late in July a this actually opened and became a wound at home health care was tending to however she is still having purulent drainage coming from this and by my understanding the wound depth is actually become unmeasurable. I am not really clear about what home health has been placing in any of these wound areas. The patient states that is something with silver and it. She is not been systemically unwell no fever or chills her appetite is good. She is a diabetic poorly controlled however she states that her recent blood sugars at home have been in the low to mid 100s. 09/28/16 On evaluation today patient appears to continue to exhibit the 3 areas of ulceration that were noted previous. She did have an x-ray of the right pelvis which showed evidence of potential soft tissue infection but no obvious osteomyelitis. There was a discussion last office visit concerning the possibility of a wound VAC. Witth that being said the x-ray report suggested that an MRI may be more appropriate to further evaluate the extent. Subsequently in regard to the wound over the popliteal portion of the left lower extremity with tunneling at 12:00 the CT scan that was ordered was denied by insurance as they state the patient has not had x-rays prior to advanced imaging. Patient states that she is frustrated with the situation overall. 10/05/16 in the interval since I last  saw this patient last week she has had the x-ray of the knee performed. I did review that x-ray today and fortunately shows no evidence  of osteomyelitis or other acute abnormality at this point in time. She continues to have the opening iin the posterior oral popliteal space with tracking proximal up the posterior thigh. Nothing seems to have worsened but it also seems to have not improved. The same is true in regard to the right pressure ulcer over the gluteal region which extends toward the ischium. The left gluteal pressure ulcer actually appears to be doing somewhat better my opinion there is some necrotic slough but overall this appears fairly well. She tells me thatt she has some discomfort especially when home health is helping her with dressing changes as they do not know her. At worse she rates her pain to be a 5 out of 10 right now it's more like a 1 out of 10. 10/07/16; still the patient has 3 different wound areas. She has a deep stage IV wound over her right ischial tuberosity. She is due to have an MRI next week. The wound over her left ischial tuberosity is more superficial and underwent debridement today. Finally she has a small open area in her left popliteal fossa the probes on measurably forward superiorly. Still a lot of drainage coming out of this. The CT scan that I ordered 3 weeks ago was questioned by her insurance company wanting a plain x-ray first. As I understand things result of this is nothing has been done in 3 weeks in terms of imaging the thigh and she has an MRI booked of this along with her pelvis for next week line 10/12/16; patient has a deep probing wound over the right ischiall tuberosity, stage III wound over the left visual tuberosity and a draining sinus in her left popliteal fossa. None of this much different from when I saw this 3 weeks ago. We have been using silver rope to the right ischial wound and a draining area in the left popliteal fossa. Plain  silver alginate to the area on the left ischial tuberosity Electronic Signature(s) Signed: 10/12/2016 5:07:12 PM By: Baltazar Najjar MD Entered By: Baltazar Najjar on 10/12/2016 14:26:12 Groome, [name] ([id number]) -------------------------------------------------------------------------------- Physical Exam Details Patient Name: [name] Date of Service: 10/12/2016 8:45 AM Medical Record Patient Account Number: [id number] Number: Treating RN: Phillis Haggis 25-Jan-1961 (55 y.o. Other Clinician: Date of Birth/Sex: Female) Treating Aidan Moten Primary Care Physician/Extender: Leticia Penna Physician: Referring Physician: Tedra Senegal in Treatment: 3 Constitutional Sitting or standing Blood Pressure is within target range for patient.. Pulse regular and within target range for patient.Marland Kitchen Respirations regular, non-labored and within target range.. Temperature is normal and within the target range for the patient.. Patient's appearance is neat and clean. Appears in no acute distress. Well nourished and well developed.. Notes Wound exam; wounds are in much the same state as when I saw this 3 weeks ago. She has a deep probing area going right down to the right ischial tuberosity. More superficial wound on the left debrided with a curet the adherent surface slough this cleans up quite nicely. She also has a small open area in the left popliteal fossa that goes superiorly into the thigh musculature I think unmeasurable i.e. be on the level of our probes. Suspect this is an exit site Psychologist, prison and probation services) Signed: 10/12/2016 5:07:12 PM By: Baltazar Najjar MD Entered By: Baltazar Najjar on 10/12/2016 10:56:43 Gardella, [name] ([id number]) -------------------------------------------------------------------------------- Physician Orders Details Patient Name: [name] Date of Service: 10/12/2016 8:45 AM Medical Record Patient Account Number:  [id number] Number: Treating RN:  Ashok Cordia, Debi 03/31/1961 (55 y.o. Other Clinician: Date of Birth/Sex: Female) Treating Thaison Kolodziejski Primary Care Physician/Extender: [name], [name] Physician: Referring Physician: Tedra Senegal in Treatment: 3 Verbal / Phone Orders: Yes Clinician: Pinkerton, Debi Read Back and Verified: Yes Diagnosis Coding Wound Cleansing Wound #3 Right Gluteal fold o Clean wound with Normal Saline. Wound #4 Left Gluteal fold o Clean wound with Normal Saline. Wound #5 Left,Posterior Knee o Clean wound with Normal Saline. Anesthetic Wound #3 Right Gluteal fold o Topical Lidocaine 4% cream applied to wound bed prior to debridement - clinic use only Wound #4 Left Gluteal fold o Topical Lidocaine 4% cream applied to wound bed prior to debridement - clinic use only Wound #5 Left,Posterior Knee o Topical Lidocaine 4% cream applied to wound bed prior to debridement - clinic use only Skin Barriers/Peri-Wound Care Wound #3 Right Gluteal fold o Skin Prep Wound #4 Left Gluteal fold o Skin Prep Wound #5 Left,Posterior Knee o Skin Prep Primary Wound Dressing Wound #3 Right Gluteal fold o Aquacel Ag - Silver Alginate Rope pack lightly into wound and leave a tail where it can be pulled out and to cover the outside of the wound. Fontaine, Sharanda L. ([id number]) o Sorbalgon Ag - used in clinic Wound #4 Left Gluteal fold o Aquacel Ag - Silver Alginate o Sorbalgon Ag - used in clinic Wound #5 Left,Posterior Knee o Aquacel Ag - Silver Alginate Rope pack lightly into wound and leave a tail where it can be pulled out and to cover the outside of the wound. o Sorbalgon Ag - used in clinic Secondary Dressing Wound #3 Right Gluteal fold o Dry Gauze o Non-adherent pad - Telfa island, tape Wound #4 Left Gluteal fold o Dry Gauze o Non-adherent pad - Telfa island, tape Wound #5 Left,Posterior Knee o Dry Gauze o  Non-adherent pad - Telfa island, tape Dressing Change Frequency Wound #3 Right Gluteal fold o Change dressing every other day. Wound #4 Left Gluteal fold o Change dressing every other day. Wound #5 Left,Posterior Knee o Change dressing every other day. Follow-up Appointments Wound #3 Right Gluteal fold o Return Appointment in 1 week. Wound #4 Left Gluteal fold o Return Appointment in 1 week. Wound #5 Left,Posterior Knee o Return Appointment in 1 week. Off-Loading Wound #3 Right Gluteal fold o Roho cushion for wheelchair - *********[facility] to order*********** Everding, [name] ([id number]) o Turn and reposition every 2 hours - Air Mattress *********[facility] to order*********** Wound #4 Left Gluteal fold o Roho cushion for wheelchair - *********[facility] to order*********** o Turn and reposition every 2 hours o Mattress - Museum/gallery curator *********[facility] to order*********** Wound #5 Left,Posterior Knee o Roho cushion for wheelchair - *********[facility] to order*********** o Turn and reposition every 2 hours o Mattress - Museum/gallery curator *********[facility] to order*********** Home Health Wound #3 Right Gluteal fold o Continue Home Health Visits - [facility]/ [facility] *****Please order ROHO cushion and air mattress****** o Home Health Nurse may visit PRN to address patientos wound care needs. o FACE TO FACE ENCOUNTER: MEDICARE and MEDICAID PATIENTS: I certify that this patient is under my care and that I had a face-to-face encounter that meets the physician face-to-face encounter requirements with this patient on this date. The encounter with the patient was in whole or in part for the following MEDICAL CONDITION: (primary reason for Home Healthcare) MEDICAL NECESSITY: I certify, that based on my findings, NURSING services are a medically  necessary home health service. HOME BOUND STATUS: I certify that my  clinical findings support that this patient is homebound (i.e., Due to illness or injury, pt requires aid of supportive devices such as crutches, cane, wheelchairs, walkers, the use of special transportation or the assistance of another person to leave their place of residence. There is a normal inability to leave the home and doing so requires considerable and taxing effort. Other absences are for medical reasons / religious services and are infrequent or of short duration when for other reasons). o If current dressing causes regression in wound condition, may D/C ordered dressing product/s and apply Normal Saline Moist Dressing daily until next Wound Healing Center / Other MD appointment. Notify Wound Healing Center of regression in wound condition at [phone]. o Please direct any NON-WOUND related issues/requests for orders to patient's Primary Care Physician Wound #4 Left Gluteal fold o Continue [facility] Health Visits - [facility] *****Please order ROHO cushion and air mattress****** o Home Health Nurse may visit PRN to address patientos wound care needs. o FACE TO FACE ENCOUNTER: MEDICARE and MEDICAID PATIENTS: I certify that this patient is under my care and that I had a face-to-face encounter that meets the physician face-to-face encounter requirements with this patient on this date. The encounter with the patient was in whole or in part for the following MEDICAL CONDITION: (primary reason for Home Healthcare) MEDICAL NECESSITY: I certify, that based on my findings, NURSING services are a medically necessary home health service. HOME BOUND STATUS: I certify that my clinical findings support that this patient is homebound (i.e., Due to illness or injury, pt requires aid of Manring, Jonella L. ([id number]) supportive devices such as crutches, cane, wheelchairs, walkers, the use of special transportation or the assistance of another person to leave their place of  residence. There is a normal inability to leave the home and doing so requires considerable and taxing effort. Other absences are for medical reasons / religious services and are infrequent or of short duration when for other reasons). o If current dressing causes regression in wound condition, may D/C ordered dressing product/s and apply Normal Saline Moist Dressing daily until next Wound Healing Center / Other MD appointment. Notify Wound Healing Center of regression in wound condition at [phone]. o Please direct any NON-WOUND related issues/requests for orders to patient's Primary Care Physician Wound #5 Left,Posterior Knee o Continue [facility] Health Visits - [facility] Nurse may visit PRN to address patientos wound care needs. o FACE TO FACE ENCOUNTER: MEDICARE and MEDICAID PATIENTS: I certify that this patient is under my care and that I had a face-to-face encounter that meets the physician face-to-face encounter requirements with this patient on this date. The encounter with the patient was in whole or in part for the following MEDICAL CONDITION: (primary reason for Home Healthcare) MEDICAL NECESSITY: I certify, that based on my findings, NURSING services are a medically necessary home health service. HOME BOUND STATUS: I certify that my clinical findings support that this patient is homebound (i.e., Due to illness or injury, pt requires aid of supportive devices such as crutches, cane, wheelchairs, walkers, the use of special transportation or the assistance of another person to leave their place of residence. There is a normal inability to leave the home and doing so requires considerable and taxing effort. Other absences are for medical reasons / religious services and are infrequent or of short duration when for other reasons). o If current  dressing causes regression in wound condition, may D/C ordered dressing product/s and apply Normal  Saline Moist Dressing daily until next Wound Healing Center / Other MD appointment. Notify Wound Healing Center of regression in wound condition at [phone]. o Please direct any NON-WOUND related issues/requests for orders to patient's Primary Care Physician Medications-please add to medication list. Wound #3 Right Gluteal fold o Other: - Vitamin C, Zinc, Multivitamin Wound #4 Left Gluteal fold o Other: - Vitamin C, Zinc, Multivitamin Wound #5 Left,Posterior Knee o Other: - Vitamin C, Zinc, Multivitamin Electronic Signature(s) Signed: 10/12/2016 5:07:12 PM By: Baltazar Najjarobson, Lajuane Leatham MD Signed: 10/12/2016 5:13:26 PM By: Alejandro MullingPinkerton, Debra Entered By: Alejandro MullingPinkerton, Debra on 10/12/2016 10:19:05 Zeoli, [name] L. ([id number]) Behney, [name] L. ([id number]) -------------------------------------------------------------------------------- Problem List Details Patient Name: Katelyn GeorgesWARD, Alisabeth L. Date of Service: 10/12/2016 8:45 AM Medical Record Patient Account Number: [id number] Number: Treating RN: Phillis Haggisinkerton, Debi 02/10/1961 (55 y.o. 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 ([id number]) -------------------------------------------------------------------------------- Progress Note Details Patient Name: Katelyn GeorgesWARD,  Melvina L. Date of Service: 10/12/2016 8:45 AM Medical Record Patient Account Number: [id number] Number: Treating RN: Phillis Haggisinkerton, Debi 02/10/1961 (55 y.o. Other Clinician: Date of Birth/Sex: Female) Treating Righteous Claiborne Primary Care Physician/Extender: Antonietta BreachG BLISS, [name] Physician: Referring Physician: Tedra SenegalBLISS, [name] Weeks in Treatment: 3 Subjective Chief Complaint Information obtained from Patient Wounds over the right ischial area left gluteal area and on the lateral aspect of the left popliteal fossa History of Present Illness (HPI) The following HPI elements were documented for the patient's wound: Location: bilateral gluteal ulcerations and left popliteal ulceration with tunneling Quality: Patient reports experiencing a dull pain to affected area(s). Severity: Patient states wound are getting worse. Duration: Patient has had the wound for < 2 weeks prior to presenting for treatment Timing: The pain is intermittent in severity as far as how intense it becomes but is present all the time. Manipulationn makes this worse. Context: The wound occurred when the patient had a fall and was unconscious for about 48 hours laying on the floor and she had pressure injury at that stage. Modifying Factors: Other treatment(s) tried include:as noted below she has been seen by visiting wound care physicians or nurse practitioners and details have been noted Associated Signs and Symptoms: Patient reports having difficulty standing for long periods. 55 year old patient who was seen by visiting [name] wound care specialist for a wound on both her buttock and was found to have an unstageable wound on the right buttock for about 2 months. I understand that she had a fall and was laying on the floor for about 48 hours before she was found and taken to the ICU and had a long injury to her gluteal area from pressure and also had broken her right humerus. She has had a right proximal humerus fracture  and has been followed up with orthopedics recently. The patient has a past medical history of type 2 diabetes mellitus, paraparesis, acute pyelonephritis, GERD, hypertension, glaucoma, chronic pain, anxiety neurosis,  nicotine dependence, COPD. the patient had some debridement done and was to operative was recommended to use Silvadene dressing and offloading. She is a smoker and occasionally smokes a few cigarettes. the patient requested a second opinion for months and is here to discuss her care. 09/21/16; the patient re-presents from home today for review of 3 different wounds. I note that she was seen in the clinic here in July at which time she had bilateral buttock wounds. It was apparently suggested at that time that she use a wound VAC bridged to both wounds just near the initial tuberosity's bilaterally which she refused. Alfrey, Tarrin L. ([id number]) The history was a bit difficult to put together. Apparently this patient became ill at the end of April of this year. She was found sitting on the floor she had apparently been for 2 days and subsequently admitted to hospital from 04/23/16 through 05/02/16 and at that point she was critically ill ultimately having sepsis secondary to UTI, nontraumatic rhabdomyolysis and diabetic ketoacidosis. She had acute renal failure and I think required ICU care including intubation. Patient states her wounds actually started at that point on the bilateral issue tuberosities however in reviewing the discharge summary from 5/8 I see no reference to wounds at that point. It did state that she had left lower extremity cellulitis however. Reviewing Epic I see no relevant x-rays. It would appear that her discharge creatinine was within the normal range and indeed on 9/15 her creatinine has remained normal. [facility] There the wounds on her bilateral Botox were dressed. Only just before her discharge  from the nursing facility she developed an "knot" which was interpreted as cellulitis on the posterior aspect of her left knee she was given antibiotics. Apparently sometime late in July a this actually opened and became a wound at home health care was tending to however she is still having purulent drainage coming from this and by my understanding the wound depth is actually become unmeasurable. I am not really clear about what home health has been placing in any of these wound areas. The patient states that is something with silver and it. She is not been systemically unwell no fever or chills her appetite is good. She is a diabetic poorly controlled however she states that her recent blood sugars at home have been in the low to mid 100s. 09/28/16 On evaluation today patient appears to continue to exhibit the 3 areas of ulceration that were noted previous. She did have an x-ray of the right pelvis which showed evidence of potential soft tissue infection but no obvious osteomyelitis. There was a discussion last office visit concerning the possibility of a wound VAC. Witth that being said the x-ray report suggested that an MRI may be more appropriate to further evaluate the extent. Subsequently in regard to the wound over the popliteal portion of the left lower extremity with tunneling at 12:00 the CT scan that was ordered was denied by insurance as they state the patient has not had x-rays prior to advanced imaging. Patient states that she is frustrated with the situation overall. 10/05/16 in the interval since I last saw this patient last week she has had the x-ray of the knee performed. I did review that x-ray today and fortunately shows no evidence of osteomyelitis or other acute abnormality at this point in time. She continues to have the opening iin the posterior oral popliteal space with tracking proximal up the posterior  thigh. Nothing seems to have worsened but it also seems to have not  improved. The same is true in regard to the right pressure ulcer over the gluteal region which extends toward the ischium. The left gluteal pressure ulcer actually appears to be doing somewhat better my opinion there is some necrotic slough but overall this appears fairly well. She tells me thatt she has some discomfort especially when home health is helping her with dressing changes as they do not know her. At worse she rates her pain to be a 5 out of 10 right now it's more like a 1 out of 10. 10/07/16; still the patient has 3 different wound areas. She has a deep stage IV wound over her right ischial tuberosity. She is due to have an MRI next week. The wound over her left ischial tuberosity is more superficial and underwent debridement today. Finally she has a small open area in her left popliteal fossa the probes on measurably forward superiorly. Still a lot of drainage coming out of this. The CT scan that I ordered 3 weeks ago was questioned by her insurance company wanting a plain x-ray first. As I understand things result of this is nothing has been done in 3 weeks in terms of imaging the thigh and she has an MRI booked of this along with her pelvis for next week Objective Stelly, Mallissa L. ([id number]) Constitutional Sitting or standing Blood Pressure is within target range for patient.. Pulse regular and within target range for patient.Marland Kitchen Respirations regular, non-labored and within target range.. Temperature is normal and within the target range for the patient.. Patient's appearance is neat and clean. Appears in no acute distress. Well nourished and well developed.. Vitals Time Taken: 8:52 AM, Height: 63 in, Weight: 257 lbs, BMI: 45.5, Temperature: 98.1 F, Pulse: 69 bpm, Respiratory Rate: 18 breaths/min, Blood Pressure: 124/52 mmHg. General Notes: Wound exam; wounds are in much the same state as when I saw this 3 weeks ago. She has a deep probing area going right down to the right  ischial tuberosity. More superficial wound on the left debrided with a curet the adherent surface slough this cleans up quite nicely. She also has a small open area in the left popliteal fossa that goes superiorly into the thigh musculature I think unmeasurable i.e. be on the level of our probes. Suspect this is an exit site Integumentary (Hair, Skin) Wound #3 status is Open. Original cause of wound was Pressure Injury. The wound is located on the Right Gluteal fold. The wound measures 2cm length x 1.2cm width x 6cm depth; 1.885cm^2 area and 11.31cm^3 volume. There is muscle, fat, and fascia exposed. There is no tunneling or undermining noted. There is a large amount of serous drainage noted. The wound margin is distinct with the outline attached to the wound base. There is large (67-100%) pink, pale granulation within the wound bed. There is a small (1-33%) amount of necrotic tissue within the wound bed including Adherent Slough. The periwound skin appearance exhibited: Maceration, Moist. Periwound temperature was noted as No Abnormality. The periwound has tenderness on palpation. Wound #4 status is Open. Original cause of wound was Pressure Injury. The wound is located on the Left Gluteal fold. The wound measures 1.6cm length x 3.8cm width x 0.1cm depth; 4.775cm^2 area and 0.478cm^3 volume. There is muscle and fat exposed. There is no tunneling or undermining noted. There is a large amount of serous drainage noted. The wound margin is distinct with the outline attached  to the wound base. There is large (67-100%) red, pink granulation within the wound bed. There is a small (1-33%) amount of necrotic tissue within the wound bed including Adherent Slough. The periwound skin appearance exhibited: Maceration, Moist. Periwound temperature was noted as No Abnormality. The periwound has tenderness on palpation. Wound #5 status is Open. Original cause of wound was Gradually Appeared. The wound is  located on the Left,Posterior Knee. The wound measures 0.5cm length x 0.5cm width x 10cm depth; 0.196cm^2 area and 1.963cm^3 volume. There is muscle and fat exposed. There is no tunneling or undermining noted. There is a large amount of serous drainage noted. The wound margin is flat and intact. There is medium (34-66%) pink granulation within the wound bed. There is a medium (34-66%) amount of necrotic tissue within the wound bed including Adherent Slough. The periwound skin appearance exhibited: Localized Edema, Maceration, Moist, Rubor, Erythema. The periwound skin appearance did not exhibit: Callus, Crepitus, Excoriation, Fluctuance, Friable, Induration, Rash, Scarring, Dry/Scaly, Atrophie Blanche, Cyanosis, Ecchymosis, Hemosiderin Staining, Mottled, Pallor. The surrounding wound skin color is noted with erythema which is circumferential. Periwound temperature was noted as Hot. The periwound has tenderness on palpation. Okimoto, Aniyla L. 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 There was a Skin/Subcutaneous Tissue Debridement ([id number]) debridement with total area of 6.08 sq cm performed by Maxwell Caul, MD. with the following instrument(s): Curette to remove Viable and Non-Viable tissue/material including Exudate, Fibrin/Slough, and Subcutaneous after achieving pain control using Lidocaine 4% Topical Solution. A time out was conducted at 09:25, prior to the start of the procedure. A Minimum amount of bleeding was controlled with Pressure. The procedure was tolerated well with a pain level of 0 throughout and a pain level of 0 following the procedure. Post  Debridement Measurements: 1.6cm length x 3.8cm width x 0.1cm depth; 0.478cm^3 volume. Post debridement Stage noted as Category/Stage IV. Character of Wound/Ulcer Post Debridement is stable. Severity of Tissue Post Debridement is: Fat layer exposed. Post procedure Diagnosis Wound #4: Same as Pre-Procedure Plan Wound Cleansing: Wound #3 Right Gluteal fold: Clean wound with Normal Saline. Wound #4 Left Gluteal fold: Clean wound with Normal Saline. Wound #5 Left,Posterior Knee: Clean wound with Normal Saline. Anesthetic: Wound #3 Right Gluteal fold: Topical Lidocaine 4% cream applied to wound bed prior to debridement - clinic use only Patman, Ziyanna L. ([id number]) Wound #4 Left Gluteal fold: Topical Lidocaine 4% cream applied to wound bed prior to debridement - clinic use only Wound #5 Left,Posterior Knee: Topical Lidocaine 4% cream applied to wound bed prior to debridement - clinic use only Skin Barriers/Peri-Wound Care: Wound #3 Right Gluteal fold: Skin Prep Wound #4 Left Gluteal fold: Skin Prep Wound #5 Left,Posterior Knee: Skin Prep Primary Wound Dressing: Wound #3 Right Gluteal fold: Aquacel Ag - Silver Alginate Rope pack lightly into wound and leave a tail where it can be pulled out and to cover the outside of the wound. Sorbalgon Ag - used in clinic Wound #4 Left Gluteal fold: Aquacel Ag - Silver Alginate Sorbalgon Ag - used in clinic Wound #5 Left,Posterior Knee: Aquacel Ag - Silver Alginate Rope pack lightly into wound and leave a tail where it can be pulled out  and to cover the outside of the wound. Sorbalgon Ag - used in clinic Secondary Dressing: Wound #3 Right Gluteal fold: Dry Gauze Non-adherent pad - Telfa island, tape Wound #4 Left Gluteal fold: Dry Gauze Non-adherent pad - Telfa island, tape Wound #5 Left,Posterior Knee: Dry Gauze Non-adherent pad - Telfa island, tape Dressing Change Frequency: Wound #3 Right Gluteal fold: Change dressing every other  day. Wound #4 Left Gluteal fold: Change dressing every other day. Wound #5 Left,Posterior Knee: Change dressing every other day. Follow-up Appointments: Wound #3 Right Gluteal fold: Return Appointment in 1 week. Wound #4 Left Gluteal fold: Return Appointment in 1 week. Wound #5 Left,Posterior Knee: Return Appointment in 1 week. Off-Loading: Wound #3 Right Gluteal fold: Roho cushion for wheelchair - *********[facility] to order*********** Turn and reposition every 2 hours - Museum/gallery curator *********[facility] to order*********** Auerbach, [name] ([id number]) Wound #4 Left Gluteal fold: Roho cushion for wheelchair - *********[facility] to order*********** Turn and reposition every 2 hours Mattress - Museum/gallery curator *********[facility] to order*********** Wound #5 Left,Posterior Knee: Roho cushion for wheelchair - *********[facility] to order*********** Turn and reposition every 2 hours Mattress - Museum/gallery curator *********[facility] to order*********** Home Health: Wound #3 Right Gluteal fold: Continue Home Health Visits - [facility] *****Please order ROHO cushion and air mattress****** Home Health Nurse may visit PRN to address patient s wound care needs. FACE TO FACE ENCOUNTER: MEDICARE and MEDICAID PATIENTS: I certify that this patient is under my care and that I had a face-to-face encounter that meets the physician face-to-face encounter requirements with this patient on this date. The encounter with the patient was in whole or in part for the following MEDICAL CONDITION: (primary reason for Home Healthcare) MEDICAL NECESSITY: I certify, that based on my findings, NURSING services are a medically necessary home health service. HOME BOUND STATUS: I certify that my clinical findings support that this patient is homebound (i.e., Due to illness or injury, pt requires aid of supportive devices such as crutches, cane, wheelchairs, walkers, the use of  special transportation or the assistance of another person to leave their place of residence. There is a normal inability to leave the home and doing so requires considerable and taxing effort. Other absences are for medical reasons / religious services and are infrequent or of short duration when for other reasons). If current dressing causes regression in wound condition, may D/C ordered dressing product/s and apply Normal Saline Moist Dressing daily until next Wound Healing Center / Other MD appointment. Notify Wound Healing Center of regression in wound condition at [phone]. Please direct any NON-WOUND related issues/requests for orders to patient's Primary Care Physician Wound #4 Left Gluteal fold: Continue [facility] Visits - [facility] *****Please order ROHO cushion and air mattress****** Home Health Nurse may visit PRN to address patient s wound care needs. FACE TO FACE ENCOUNTER: MEDICARE and MEDICAID PATIENTS: I certify that this patient is under my care and that I had a face-to-face encounter that meets the physician face-to-face encounter requirements with this patient on this date. The encounter with the patient was in whole or in part for the following MEDICAL CONDITION: (primary reason for Home Healthcare) MEDICAL NECESSITY: I certify, that based on my findings, NURSING services are a medically necessary home health service. HOME BOUND STATUS: I certify that my clinical findings support that this patient is homebound (i.e., Due to illness or injury, pt requires aid of supportive  devices such as crutches, cane, wheelchairs, walkers, the use of special transportation or the assistance of another person to leave their place of residence. There is a normal inability to leave the home and doing so requires considerable and taxing effort. Other absences are for medical reasons / religious services and are infrequent or of short duration when for other reasons). If  current dressing causes regression in wound condition, may D/C ordered dressing product/s and apply Normal Saline Moist Dressing daily until next Wound Healing Center / Other MD appointment. Notify Wound Healing Center of regression in wound condition at [phone]. Please direct any NON-WOUND related issues/requests for orders to patient's Primary Care Physician Wound #5 Left,Posterior Knee: Continue Home Health Visits - [facility] Nurse may visit PRN to address patient s wound care needs. FACE TO FACE ENCOUNTER: MEDICARE and MEDICAID PATIENTS: I certify that this patient is under my care and that I had a face-to-face encounter that meets the physician face-to-face encounter requirements with this patient on this date. The encounter with the patient was in whole or in part for the Larmore, Naiya L. ([id number]) following MEDICAL CONDITION: (primary reason for Home Healthcare) MEDICAL NECESSITY: I certify, that based on my findings, NURSING services are a medically necessary home health service. HOME BOUND STATUS: I certify that my clinical findings support that this patient is homebound (i.e., Due to illness or injury, pt requires aid of supportive devices such as crutches, cane, wheelchairs, walkers, the use of special transportation or the assistance of another person to leave their place of residence. There is a normal inability to leave the home and doing so requires considerable and taxing effort. Other absences are for medical reasons / religious services and are infrequent or of short duration when for other reasons). If current dressing causes regression in wound condition, may D/C ordered dressing product/s and apply Normal Saline Moist Dressing daily until next Wound Healing Center / Other MD appointment. Notify Wound Healing Center of regression in wound condition at [phone]. Please direct any NON-WOUND related issues/requests for orders to patient's  Primary Care Physician Medications-please add to medication list.: Wound #3 Right Gluteal fold: Other: - Vitamin C, Zinc, Multivitamin Wound #4 Left Gluteal fold: Other: - Vitamin C, Zinc, Multivitamin Wound #5 Left,Posterior Knee: Other: - Vitamin C, Zinc, Multivitamin we are using silver alginate rope packing tothe right ischial tuberoxity wound. as well as the left popliteal area silver alginate/foam to the leftischial tuberosity wound she goes for a pelvis and left thigh MRI next week Electronic Signature(s) Signed: 10/12/2016 5:07:12 PM By: Baltazar Najjar MD Entered By: Baltazar Najjar on 10/12/2016 10:59:45 Chieffo, [name] ([id number]) -------------------------------------------------------------------------------- SuperBill Details Patient Name: [name] Date of Service: 10/12/2016 Medical Record Patient Account Number: [id number] Number: Treating RN: Phillis Haggis 1961/02/13 (55 y.o. 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

## 2016-10-13 NOTE — Progress Notes (Signed)
[name], [name] ([id number]) Visit Report for 10/12/2016 Arrival Information Details Patient Name: [name], [name] Date of Service: 10/12/2016 8:45 AM Medical Record Patient Account Number: [id number] Number: Treating RN: Ahmed Prima August 09, 1961 (55 y.o. Other Clinician: Date of Birth/Sex: Female) Treating ROBSON, MICHAEL Primary Care Physician: Lavera Guise Physician/Extender: G Referring Physician: Sallee Lange in Treatment: 3 Visit Information History Since Last Visit All ordered tests and consults were completed: No Patient Arrived: Wheel Chair Added or deleted any medications: No Arrival Time: 08:52 Any new allergies or adverse reactions: No Accompanied By: self Had a fall or experienced change in No Transfer Assistance: [employer] activities of daily living that may affect Patient Lift risk of falls: Patient Identification Verified: Yes Signs or symptoms of abuse/neglect since last No Secondary Verification Process Yes visito Completed: Hospitalized since last visit: No Patient Requires Transmission- No Pain Present Now: No Based Precautions: Patient Has Alerts: Yes Patient Alerts: DM II Electronic Signature(s) Signed: 10/12/2016 5:13:26 PM By: Alric Quan Entered By: Alric Quan on 10/12/2016 08:52:28 [name], [name] ([id number]) -------------------------------------------------------------------------------- Encounter Discharge Information Details Patient Name: [name] Date of Service: 10/12/2016 8:45 AM Medical Record Patient Account Number: [id number] Number: Treating RN: Ahmed Prima [birth year] (55 y.o. Other Clinician: Date of Birth/Sex: Female) Treating ROBSON, MICHAEL Primary Care Physician: Lavera Guise Physician/Extender: G Referring Physician: Sallee Lange in Treatment: 3 Encounter Discharge Information Items Discharge Pain Level: 0 Discharge Condition: Stable Ambulatory Status:  Wheelchair Discharge Destination: Home Transportation: Other Accompanied By: self Schedule Follow-up Appointment: Yes Medication Reconciliation completed and provided to Patient/Care Yes Junette Bernat: Provided on Clinical Summary of Care: 10/12/2016 Form Type Recipient Paper Patient TW Electronic Signature(s) Signed: 10/12/2016 9:48:59 AM By: Ruthine Dose Entered By: Ruthine Dose on 10/12/2016 09:48:58 [name], [name] ([id number]) -------------------------------------------------------------------------------- Lower Extremity Assessment Details Patient Name: [name] Date of Service: 10/12/2016 8:45 AM Medical Record Patient Account Number: [id number] Number: Treating RN: Ahmed Prima 03-Apr-1961 (55 y.o. Other Clinician: Date of Birth/Sex: Female) Treating ROBSON, MICHAEL Primary Care Physician: Lavera Guise Physician/Extender: G Referring Physician: Sallee Lange in Treatment: 3 Electronic Signature(s) Signed: 10/12/2016 5:13:26 PM By: Alric Quan Entered By: Alric Quan on 10/12/2016 08:57:53 [name], [name] ([id number]) -------------------------------------------------------------------------------- Multi Wound Chart Details Patient Name: [name] Date of Service: 10/12/2016 8:45 AM Medical Record Patient Account Number: [id number] Number: Treating RN: Ahmed Prima 1961-03-17 (55 y.o. Other Clinician: Date of Birth/Sex: Female) Treating ROBSON, MICHAEL Primary Care Physician: Lavera Guise Physician/Extender: G Referring Physician: Sallee Lange in Treatment: 3 Vital Signs Height(in): 63 Pulse(bpm): 69 Weight(lbs): 257 Blood Pressure 124/52 (mmHg): Body Mass Index(BMI): 46 Temperature(F): 98.1 Respiratory Rate 18 (breaths/min): Photos: [3:No Photos] [4:No Photos] [5:No Photos] Wound Location: [3:Right Gluteal fold] [4:Left Gluteal fold] [5:Left Knee - Posterior] Wounding Event: [3:Pressure Injury]  [4:Pressure Injury] [5:Gradually Appeared] Primary Etiology: [3:Pressure Ulcer] [4:Pressure Ulcer] [5:Diabetic Wound/Ulcer of the Lower Extremity] Comorbid History: [3:Asthma, Hypertension, Type II Diabetes, Neuropathy] [4:Asthma, Hypertension, Type II Diabetes, Neuropathy] [5:Asthma, Hypertension, Type II Diabetes, Neuropathy] Date Acquired: [3:04/21/2016] [4:04/21/2016] [5:07/15/2016] Weeks of Treatment: [3:3] [4:3] [5:3] Wound Status: [3:Open] [4:Open] [5:Open] Measurements L x W x D 2x1.2x6 [4:1.6x3.8x0.1] [5:0.5x0.5x10] (cm) Area (cm) : [3:1.885] [4:4.775] [5:0.196] Volume (cm) : [3:11.31] [4:0.478] [5:1.963] % Reduction in Area: [3:38.60%] [4:38.50%] [5:60.40%] % Reduction in Volume: 34.20% [4:96.80%] [5:73.60%] Classification: [3:Category/Stage IV] [4:Category/Stage IV] [5:Grade 2] Exudate Amount: [3:Large] [4:Large] [5:Large] Exudate Type: [3:Serous] [4:Serous] [5:Serous] Exudate Color: [3:amber] [4:amber] [5:amber] Wound Margin: [3:Distinct, outline attached] [4:Distinct, outline attached] [  5:Flat and Intact] Granulation Amount: [3:Large (67-100%)] [4:Large (67-100%)] [5:Medium (34-66%)] Granulation Quality: [3:Pink, Pale] [4:Red, Pink] [5:Pink] Necrotic Amount: [3:Small (1-33%)] [4:Small (1-33%)] [5:Medium (34-66%)] Exposed Structures: [3:Fascia: Yes Fat: Yes Muscle: Yes] [4:Fat: Yes Muscle: Yes] [5:Fat: Yes Muscle: Yes Fascia: No] Tendon: No Fascia: No Tendon: No Joint: No Tendon: No Joint: No Bone: No Bone: No Epithelialization: None None None Periwound Skin Texture: No Abnormalities Noted No Abnormalities Noted Edema: Yes Excoriation: No Induration: No Callus: No Crepitus: No Fluctuance: No Friable: No Rash: No Scarring: No Periwound Skin Maceration: Yes Maceration: Yes Maceration: Yes Moisture: Moist: Yes Moist: Yes Moist: Yes Dry/Scaly: No Periwound Skin Color: No Abnormalities Noted No Abnormalities Noted Erythema: Yes Rubor: Yes Atrophie Blanche:  No Cyanosis: No Ecchymosis: No Hemosiderin Staining: No Mottled: No Pallor: No Erythema Location: N/A N/A Circumferential Temperature: No Abnormality No Abnormality Hot Tenderness on Yes Yes Yes Palpation: Wound Preparation: Ulcer Cleansing: Ulcer Cleansing: Ulcer Cleansing: Rinsed/Irrigated with Rinsed/Irrigated with Rinsed/Irrigated with Saline Saline Saline Topical Anesthetic Topical Anesthetic Topical Anesthetic Applied: Other: lidocaine Applied: Other: lidocaine Applied: Other: lidocaine 4% 4% 4% Treatment Notes Electronic Signature(s) Signed: 10/12/2016 5:13:26 PM By: Alric Quan Entered By: Alric Quan on 10/12/2016 09:06:20 [name], [name] ([id number]) -------------------------------------------------------------------------------- Multi-Disciplinary Care Plan Details Patient Name: [name] Date of Service: 10/12/2016 8:45 AM Medical Record Patient Account Number: [id number] Number: Treating RN: Ahmed Prima 16-Jul-1961 (55 y.o. Other Clinician: Date of Birth/Sex: Female) Treating ROBSON, [place] Primary Care Physician: Lavera Guise Physician/Extender: G Referring Physician: Sallee Lange in Treatment: 3 Active Inactive Abuse / Safety / Falls / Self Care Management Nursing Diagnoses: Potential for falls Goals: Patient will remain injury free Date Initiated: 09/21/2016 Goal Status: Active Interventions: Assess fall risk on admission and as needed Notes: Nutrition Nursing Diagnoses: Imbalanced nutrition Potential for alteratiion in Nutrition/Potential for imbalanced nutrition Goals: Patient/caregiver agrees to and verbalizes understanding of need to use nutritional supplements and/or vitamins as prescribed Date Initiated: 09/21/2016 Goal Status: Active Patient/caregiver verbalizes understanding of need to maintain therapeutic glucose control per primary care physician Date Initiated: 09/21/2016 Goal Status:  Active Interventions: Assess patient nutrition upon admission and as needed per policy Notes: Casali, [place] ([id number]) Orientation to the Wound Care Program Nursing Diagnoses: Knowledge deficit related to the wound healing center program Goals: Patient/caregiver will verbalize understanding of the [place] [facility] Date Initiated: 09/21/2016 Goal Status: Active Interventions: Provide education on orientation to the wound center Notes: Pain, Acute or Chronic Nursing Diagnoses: Pain, acute or chronic: actual or potential Potential alteration in comfort, pain Goals: Patient will verbalize adequate pain control and receive pain control interventions during procedures as needed Date Initiated: 09/21/2016 Goal Status: Active Patient/caregiver will verbalize adequate pain control between visits Date Initiated: 09/21/2016 Goal Status: Active Patient/caregiver will verbalize comfort level met Date Initiated: 09/21/2016 Goal Status: Active Interventions: Assess comfort goal upon admission Complete pain assessment as per visit requirements Notes: Wound/Skin Impairment Nursing Diagnoses: Impaired tissue integrity Goals: Ulcer/skin breakdown will have a volume reduction of 30% by week 4 Date Initiated: 09/21/2016 NOGA, FOGG ([id number]) Goal Status: Active Ulcer/skin breakdown will have a volume reduction of 50% by week 8 Date Initiated: 09/21/2016 Goal Status: Active Ulcer/skin breakdown will have a volume reduction of 80% by week 12 Date Initiated: 09/21/2016 Goal Status: Active Interventions: Assess ulceration(s) every visit Notes: Electronic Signature(s) Signed: 10/12/2016 5:13:26 PM By: Alric Quan Entered By: Alric Quan on 10/12/2016 09:06:15 Gheen, [place]. ([id number]) -------------------------------------------------------------------------------- Pain Assessment Details Patient Name: Katelyn Anna  L. Date of Service: 10/12/2016 8:45 AM Medical  Record Patient Account Number: [id number] Number: Treating RN: Ahmed Prima 04/16/1961 (55 y.o. Other Clinician: Date of Birth/Sex: Female) Treating ROBSON, MICHAEL Primary Care Physician: Lavera Guise Physician/Extender: G Referring Physician: Sallee Lange in Treatment: 3 Active Problems Location of Pain Severity and Description of Pain Patient Has Paino No Site Locations With Dressing Change: No Pain Management and Medication Current Pain Management: Electronic Signature(s) Signed: 10/12/2016 5:13:26 PM By: Alric Quan Entered By: Alric Quan on 10/12/2016 08:52:34 Lunt, [name] ([id number]) -------------------------------------------------------------------------------- Patient/Caregiver Education Details Patient Name: [name] Date of Service: 10/12/2016 8:45 AM Medical Record Patient Account Number: [id number] Number: Treating RN: Ahmed Prima 1961-03-07 (55 y.o. Other Clinician: Date of Birth/Gender: Female) Treating ROBSON, MICHAEL Primary Care Physician: Lavera Guise Physician/Extender: G Referring Physician: Sallee Lange in Treatment: 3 Education Assessment Education Provided To: Patient Education Topics Provided Wound/Skin Impairment: Handouts: Other: change dressing as ordered Methods: Demonstration, Explain/Verbal Responses: State content correctly Electronic Signature(s) Signed: 10/12/2016 5:13:26 PM By: Alric Quan Entered By: Alric Quan on 10/12/2016 09:12:48 Brazzel, [place] ([id number]) -------------------------------------------------------------------------------- Wound Assessment Details Patient Name: [name] Date of Service: 10/12/2016 8:45 AM Medical Record Patient Account Number: [id number] Number: Treating RN: Ahmed Prima [birth year] (55 y.o. Other Clinician: Date of Birth/Sex: Female) Treating ROBSON, [place] Primary Care Physician: Lavera Guise Physician/Extender: G Referring Physician: Sallee Lange in Treatment: 3 Wound Status Wound Number: 3 Primary Pressure Ulcer Etiology: Wound Location: Right Gluteal fold Wound Status: Open Wounding Event: Pressure Injury Comorbid Asthma, Hypertension, Type II Date Acquired: 04/21/2016 History: Diabetes, Neuropathy Weeks Of Treatment: 3 Clustered Wound: No Photos Photo Uploaded By: Alric Quan on 10/12/2016 09:14:34 Wound Measurements Length: (cm) 2 Width: (cm) 1.2 Depth: (cm) 6 Area: (cm) 1.885 Volume: (cm) 11.31 % Reduction in Area: 38.6% % Reduction in Volume: 34.2% Epithelialization: None Tunneling: No Undermining: No Wound Description Classification: Category/Stage IV Wound Margin: Distinct, outline attached Exudate Amount: Large Exudate Type: Serous Exudate Color: amber Foul Odor After Cleansing: No Wound Bed Granulation Amount: Large (67-100%) Exposed Structure Granulation Quality: Pink, Pale Fascia Exposed: Yes Necrotic Amount: Small (1-33%) Fat Layer Exposed: Yes [name], [name] ([id number]) Necrotic Quality: Adherent Slough Tendon Exposed: No Muscle Exposed: Yes Necrosis of Muscle: No Joint Exposed: No Bone Exposed: No Periwound Skin Texture Texture Color No Abnormalities Noted: No No Abnormalities Noted: No Moisture Temperature / Pain No Abnormalities Noted: No Temperature: No Abnormality Maceration: Yes Tenderness on Palpation: Yes Moist: Yes Wound Preparation Ulcer Cleansing: Rinsed/Irrigated with Saline Topical Anesthetic Applied: Other: lidocaine 4%, Treatment Notes Wound #3 (Right Gluteal fold) 1. Cleansed with: Clean wound with Normal Saline 2. Anesthetic Topical Lidocaine 4% cream to wound bed prior to debridement 3. Peri-wound Care: Skin Prep 5. Secondary Dressing Applied Dry Gauze [place] Notes silver alginate, silver alginate rope Electronic Signature(s) Signed: 10/12/2016 5:13:26 PM By: Alric Quan Entered By: Alric Quan on 10/12/2016 09:05:48 [name], [name] ([id number]) -------------------------------------------------------------------------------- Wound Assessment Details Patient Name: [name] Date of Service: 10/12/2016 8:45 AM Medical Record Patient Account Number: [id number] Number: Treating RN: Ahmed Prima 03-12-1961 (55 y.o. Other Clinician: Date of Birth/Sex: Female) Treating ROBSON, MICHAEL Primary Care Physician: Lavera Guise Physician/Extender: G Referring Physician: Sallee Lange in Treatment: 3 Wound Status Wound Number: 4 Primary Pressure Ulcer Etiology: Wound Location: Left Gluteal fold Wound Status: Open Wounding Event: Pressure Injury Comorbid Asthma, Hypertension, Type II Date Acquired: 04/21/2016 History: Diabetes, Neuropathy Weeks Of Treatment:  3 Clustered Wound: No Photos Photo Uploaded By: Alric Quan on 10/12/2016 09:14:34 Wound Measurements Length: (cm) 1.6 Width: (cm) 3.8 Depth: (cm) 0.1 Area: (cm) 4.775 Volume: (cm) 0.478 % Reduction in Area: 38.5% % Reduction in Volume: 96.8% Epithelialization: None Tunneling: No Undermining: No Wound Description Classification: Category/Stage IV Wound Margin: Distinct, outline attached Exudate Amount: Large Exudate Type: Serous Exudate Color: amber Foul Odor After Cleansing: No Wound Bed Granulation Amount: Large (67-100%) Exposed Structure Granulation Quality: Red, Pink Fascia Exposed: No Necrotic Amount: Small (1-33%) Fat Layer Exposed: Yes Carll, Rejina L. ([id number]) Necrotic Quality: Adherent Slough Tendon Exposed: No Muscle Exposed: Yes Necrosis of Muscle: No Periwound Skin Texture Texture Color No Abnormalities Noted: No No Abnormalities Noted: No Moisture Temperature / Pain No Abnormalities Noted: No Temperature: No Abnormality Maceration: Yes Tenderness on Palpation: Yes Moist: Yes Wound Preparation Ulcer Cleansing:  Rinsed/Irrigated with Saline Topical Anesthetic Applied: Other: lidocaine 4%, Treatment Notes Wound #4 (Left Gluteal fold) 1. Cleansed with: Clean wound with Normal Saline 2. Anesthetic Topical Lidocaine 4% cream to wound bed prior to debridement 3. Peri-wound Care: Skin Prep 5. Secondary Dressing Applied Dry Gauze [place] Notes silver alginate, silver alginate rope Electronic Signature(s) Signed: 10/12/2016 5:13:26 PM By: Alric Quan Entered By: Alric Quan on 10/12/2016 09:05:16 [name], [name] ([id number]) -------------------------------------------------------------------------------- Wound Assessment Details Patient Name: [name] Date of Service: 10/12/2016 8:45 AM Medical Record Patient Account Number: [id number] Number: Treating RN: Ahmed Prima 1961/02/02 (55 y.o. Other Clinician: Date of Birth/Sex: Female) Treating ROBSON, MICHAEL Primary Care Physician: Lavera Guise Physician/Extender: G Referring Physician: Sallee Lange in Treatment: 3 Wound Status Wound Number: 5 Primary Diabetic Wound/Ulcer of the Lower Etiology: Extremity Wound Location: Left Knee - Posterior Wound Status: Open Wounding Event: Gradually Appeared Comorbid Asthma, Hypertension, Type II Date Acquired: 07/15/2016 History: Diabetes, Neuropathy Weeks Of Treatment: 3 Clustered Wound: No Photos Photo Uploaded By: Alric Quan on 10/12/2016 09:14:48 Wound Measurements Length: (cm) 0.5 Width: (cm) 0.5 Depth: (cm) 10 Area: (cm) 0.196 Volume: (cm) 1.963 % Reduction in Area: 60.4% % Reduction in Volume: 73.6% Epithelialization: None Tunneling: No Undermining: No Wound Description Classification: Grade 2 Wound Margin: Flat and Intact Exudate Amount: Large Exudate Type: Serous Exudate Color: amber Foul Odor After Cleansing: No Wound Bed Granulation Amount: Medium (34-66%) Exposed Structure Granulation Quality: Pink Fascia Exposed:  No Necrotic Amount: Medium (34-66%) Fat Layer Exposed: Yes [name], [name] ([id number]) Necrotic Quality: Adherent Slough Tendon Exposed: No Muscle Exposed: Yes Necrosis of Muscle: No Joint Exposed: No Bone Exposed: No Periwound Skin Texture Texture Color No Abnormalities Noted: No No Abnormalities Noted: No Callus: No Atrophie Blanche: No Crepitus: No Cyanosis: No Excoriation: No Ecchymosis: No Fluctuance: No Erythema: Yes Friable: No Erythema Location: Circumferential Induration: No Hemosiderin Staining: No Localized Edema: Yes Mottled: No Rash: No Pallor: No Scarring: No Rubor: Yes Moisture Temperature / Pain No Abnormalities Noted: No Temperature: Hot Dry / Scaly: No Tenderness on Palpation: Yes Maceration: Yes Moist: Yes Wound Preparation Ulcer Cleansing: Rinsed/Irrigated with Saline Topical Anesthetic Applied: Other: lidocaine 4%, Treatment Notes Wound #5 (Left, Posterior Knee) 1. Cleansed with: Clean wound with Normal Saline 2. Anesthetic Topical Lidocaine 4% cream to wound bed prior to debridement 3. Peri-wound Care: Skin Prep 5. Secondary Dressing Applied Dry Gauze [place] Notes silver alginate, silver alginate rope Electronic Signature(s) Signed: 10/12/2016 5:13:26 PM By: Alric Quan Entered By: Alric Quan on 10/12/2016 09:06:08 [name], [name] ([id number]) Keithly, Gaston. ([id number]) -------------------------------------------------------------------------------- Vitals Details Patient Name: Swinford, Ryian L.  Date of Service: 10/12/2016 8:45 AM Medical Record Patient Account Number: [id number] Number: Treating RN: Ahmed Prima 26-Nov-1961 (55 y.o. Other Clinician: Date of Birth/Sex: Female) Treating ROBSON, MICHAEL Primary Care Physician: Lavera Guise Physician/Extender: G Referring Physician: Sallee Lange in Treatment: 3 Vital Signs Time Taken: 08:52 Temperature (F): 98.1 Height (in): 63 Pulse (bpm):  69 Weight (lbs): 257 Respiratory Rate (breaths/min): 18 Body Mass Index (BMI): 45.5 Blood Pressure (mmHg): 124/52 Reference Range: 80 - 120 mg / dl Electronic Signature(s) Signed: 10/12/2016 5:13:26 PM By: Alric Quan Entered By: Alric Quan on 10/12/2016 08:57:41

## 2016-10-19 ENCOUNTER — Encounter: Payer: Managed Care, Other (non HMO) | Admitting: Internal Medicine

## 2016-10-19 DIAGNOSIS — L89323 Pressure ulcer of left buttock, stage 3: Secondary | ICD-10-CM | POA: Diagnosis not present

## 2016-10-20 ENCOUNTER — Other Ambulatory Visit
Admission: RE | Admit: 2016-10-20 | Discharge: 2016-10-20 | Disposition: A | Discharge status: Home / Self Care | Payer: Managed Care, Other (non HMO) | Source: Ambulatory Visit | Attending: Internal Medicine | Admitting: Internal Medicine

## 2016-10-20 DIAGNOSIS — L97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 severity: Secondary | ICD-10-CM | POA: Diagnosis not present

## 2016-10-20 DIAGNOSIS — L89314 Pressure ulcer of right buttock, stage 4: Secondary | ICD-10-CM | POA: Diagnosis not present

## 2016-10-20 NOTE — Progress Notes (Signed)
[name], [name] ([id number]) Visit Report for 10/19/2016 Arrival Information Details Patient Name: [name], [name] Date of Service: 10/19/2016 8:00 AM Medical Record Patient Account Number: [id number] Number: Treating RN: Ahmed Prima [birth year] (55 y.o. Other Clinician: Date of Birth/Sex: Female) Treating ROBSON, MICHAEL Primary Care Physician: Lavera Guise Physician/Extender: G Referring Physician: Sallee Lange in Treatment: 4 Visit Information History Since Last Visit All ordered tests and consults were completed: No Patient Arrived: Wheel Chair Added or deleted any medications: No Arrival Time: 08:26 Any new allergies or adverse reactions: No Accompanied By: self Had a fall or experienced change in No activities of daily living that may affect Transfer Assistance: None risk of falls: Patient Identification Verified: Yes Signs or symptoms of abuse/neglect since last No Secondary Verification Process Yes visito Completed: Hospitalized since last visit: No Patient Requires Transmission-Based No Pain Present Now: No Precautions: Patient Has Alerts: Yes Patient Alerts: DM II Electronic Signature(s) Signed: 10/19/2016 5:37:51 PM By: Alric Quan Entered By: Alric Quan on 10/19/2016 08:27:21 Sher, Niyah Carlean [name] ([id number]) -------------------------------------------------------------------------------- Clinic Level of Care Assessment Details Patient Name: [name] Date of Service: 10/19/2016 8:00 AM Medical Record Patient Account Number: [id number] Number: Treating RN: Ahmed Prima 1961/11/19 (55 y.o. Other Clinician: Date of Birth/Sex: Female) Treating ROBSON, [place] Primary Care Physician: Lavera Guise Physician/Extender: G Referring Physician: Sallee Lange in Treatment: 4 Clinic Level of Care Assessment Items TOOL 4 Quantity Score X - Use when only an EandM is performed on FOLLOW-UP visit 1 0 ASSESSMENTS - Nursing  Assessment / Reassessment X - Reassessment of Co-morbidities (includes updates in patient status) 1 10 X - Reassessment of Adherence to Treatment Plan 1 5 ASSESSMENTS - [name] and Skin Assessment / Reassessment []  - Simple [name] Assessment / Reassessment - one [name] 0 X - Complex [name] Assessment / Reassessment - multiple wounds 3 5 []  - Dermatologic / Skin Assessment (not related to [name] area) 0 ASSESSMENTS - Focused Assessment []  - Circumferential Edema Measurements - multi extremities 0 []  - Nutritional Assessment / Counseling / Intervention 0 []  - Lower Extremity Assessment (monofilament, tuning fork, pulses) 0 []  - Peripheral Arterial Disease Assessment (using hand held doppler) 0 ASSESSMENTS - Ostomy and/or Continence Assessment and Care []  - Incontinence Assessment and Management 0 []  - Ostomy Care Assessment and Management (repouching, etc.) 0 PROCESS - Coordination of Care []  - Simple Patient / Family Education for ongoing care 0 X - Complex (extensive) Patient / Family Education for ongoing care 1 20 X - Staff obtains Programmer, systems, Records, Test Results / Process Orders 1 10 X - Staff telephones HHA, Nursing Homes / Clarify orders / etc 1 10 [name], [name] []  - Routine Transfer to another Facility (non-emergent condition) 0 []  - Routine Hospital Admission (non-emergent condition) 0 []  - New Admissions / Biomedical engineer / Ordering NPWT, Apligraf, etc. 0 []  - Emergency Hospital Admission (emergent condition) 0 X - Simple Discharge Coordination 1 10 []  - Complex (extensive) Discharge Coordination 0 PROCESS - Special Needs []  - Pediatric / Minor Patient Management 0 []  - Isolation Patient Management 0 []  - Hearing / Language / Visual special needs 0 []  - Assessment of Community assistance (transportation, D/C planning, etc.) 0 []  - Additional assistance / Altered mentation 0 []  - Support Surface(s) Assessment (bed, cushion, seat, etc.) 0 INTERVENTIONS - [name]  Cleansing / Measurement []  - Simple [name] Cleansing - one [name] 0 X - Complex [name] Cleansing - multiple wounds 3 5 X - [name] Imaging (  photographs - any number of wounds) 1 5 []  - [name] Tracing (instead of photographs) 0 []  - Simple [name] Measurement - one [name] 0 X - Complex [name] Measurement - multiple wounds 3 5 INTERVENTIONS - [name] Dressings X - Small [name] Dressing one or multiple wounds 3 10 []  - Medium [name] Dressing one or multiple wounds 0 []  - Large [name] Dressing one or multiple wounds 0 X - Application of Medications - topical 1 5 []  - Application of Medications - injection 0 [name], [name] ([id number]) INTERVENTIONS - Miscellaneous []  - External ear exam 0 []  - Specimen Collection (cultures, biopsies, blood, body fluids, etc.) 0 X - Specimen(s) / Culture(s) sent or taken to Lab for analysis 1 5 []  - Patient Transfer (multiple staff / [name] / Similar devices) 0 []  - Simple Staple / Suture removal (25 or less) 0 []  - Complex Staple / Suture removal (26 or more) 0 []  - Hypo / Hyperglycemic Management (close monitor of Blood Glucose) 0 []  - Ankle / Brachial Index (ABI) - do not check if billed separately 0 X - Vital Signs 1 5 Has the patient been seen at the hospital within the last three years: Yes Total Score: 160 Level Of Care: New/Established - Level 5 Electronic Signature(s) Signed: 10/19/2016 5:37:51 PM By: Alric Quan Entered By: Alric Quan on 10/19/2016 13:19:26 [name], [name] -------------------------------------------------------------------------------- Encounter Discharge Information Details Patient Name: [name] Date of Service: 10/19/2016 8:00 AM Medical Record Patient Account Number: [id number] Number: Treating RN: Ahmed Prima 11-22-1961 (55 y.o. Other Clinician: Date of Birth/Sex: Female) Treating ROBSON, MICHAEL Primary Care Physician: Lavera Guise Physician/Extender: G Referring Physician: Sallee Lange in Treatment: 4 Encounter Discharge Information Items Discharge Pain Level: 0 Discharge Condition: Stable Ambulatory Status: Wheelchair Discharge Destination: Home Transportation: Other Accompanied By: self Schedule Follow-up Appointment: Yes Medication Reconciliation completed and provided to Patient/Care Yes Nyx Keady: Provided on Clinical Summary of Care: 10/19/2016 Form Type Recipient Paper Patient TW Electronic Signature(s) Signed: 10/19/2016 9:56:24 AM By: Ruthine Dose Entered By: Ruthine Dose on 10/19/2016 09:56:24 [name], [name] ([id number]) -------------------------------------------------------------------------------- Lower Extremity Assessment Details Patient Name: [name] Date of Service: 10/19/2016 8:00 AM Medical Record Patient Account Number: [id number] Number: Treating RN: Ahmed Prima 09/29/1961 (55 y.o. Other Clinician: Date of Birth/Sex: Female) Treating ROBSON, MICHAEL Primary Care Physician: Lavera Guise Physician/Extender: G Referring Physician: Sallee Lange in Treatment: 4 Electronic Signature(s) Signed: 10/19/2016 5:37:51 PM By: Alric Quan Entered By: Alric Quan on 10/19/2016 08:27:39 [name], [name] ([id number]) -------------------------------------------------------------------------------- Multi [name] Chart Details Patient Name: [name] Date of Service: 10/19/2016 8:00 AM Medical Record Patient Account Number: [id number] Number: Treating RN: Ahmed Prima 11-18-1961 (55 y.o. 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 Comorbid History: Asthma, Hypertension, Asthma, Hypertension, Asthma, Hypertension, Type II Diabetes, Type II Diabetes, Type II Diabetes, Neuropathy Neuropathy Neuropathy Date Acquired: 04/21/2016 04/21/2016 07/15/2016 Weeks of Treatment: 4 4 4  [name] Status: Open Open Open Measurements L x W x D 1.7x1.4x6 1.2x3.3x0.1 0.3x0.3x0.6 (cm) Area (cm) : 1.869 3.11 0.071 Volume (cm) : 11.215 0.311 0.042 % Reduction in Area: 39.10% 59.90% 85.70% % Reduction in Volume: 34.80% 97.90% 99.40% Position 1 (o'clock):  12 Maximum Distance 1 15 (cm): Tunneling: No No Yes Classification: Category/Stage IV Category/Stage IV Grade 2 Exudate Amount: Large Large Large Exudate Type: Purulent Serous Purulent Exudate Color: yellow, brown, green amber yellow, brown, green Foul Odor After Yes Yes Yes Cleansing: Odor Anticipated Due to No No No Product Use: [name] Margin: Distinct, outline attached Distinct, outline attached Flat and Intact Geving, Etola L. ([id number]) Granulation Amount: Medium (34-66%) Large (67-100%) Medium (34-66%) Granulation Quality: Pink, Pale Red, Pink Pink Necrotic Amount: Medium (34-66%) Small (1-33%) Medium (34-66%) Exposed Structures: Fascia: Yes Fat: Yes Fat: Yes Fat: Yes Muscle: Yes Muscle: Yes Muscle: Yes Fascia: No Fascia: No Tendon: No Tendon: No Tendon: No Joint: No Joint: No Bone: No Bone: No Epithelialization: None None None Periwound Skin Texture: No Abnormalities Noted No Abnormalities Noted Edema: Yes Excoriation: No Induration: No Callus: No Crepitus: No Fluctuance: No Friable: No Rash: No Scarring: No Periwound Skin Maceration: Yes Maceration: Yes Maceration: Yes Moisture: Moist: Yes Moist: Yes Moist: Yes Dry/Scaly: No Periwound Skin Color: No Abnormalities Noted No Abnormalities Noted Erythema: Yes Rubor: Yes Atrophie Blanche: No Cyanosis: No Ecchymosis: No Hemosiderin Staining: No Mottled:  No Pallor: No Erythema Location: N/A N/A Circumferential Temperature: No Abnormality No Abnormality Hot Tenderness on Yes Yes Yes Palpation: [name] Preparation: Ulcer Cleansing: Ulcer Cleansing: Ulcer Cleansing: Rinsed/Irrigated with Rinsed/Irrigated with Rinsed/Irrigated with Saline Saline Saline Topical Anesthetic Topical Anesthetic Topical Anesthetic Applied: Other: lidocaine Applied: Other: lidocaine Applied: Other: lidocaine 4% 4% 4% Treatment Notes Electronic Signature(s) Signed: 10/19/2016 5:37:51 PM By: Alric Quan Entered By: Alric Quan on 10/19/2016 09:20:45 [name], [name] ([id number]) -------------------------------------------------------------------------------- Multi-Disciplinary Care Plan Details Patient Name: [name] Date of Service: 10/19/2016 8:00 AM Medical Record Patient Account Number: [id number] Number: Treating RN: Ahmed Prima 12/05/1961 (55 y.o. Other Clinician: Date of Birth/Sex: Female) Treating ROBSON, [place] Primary Care Physician: Lavera Guise Physician/Extender: G Referring Physician: Sallee Lange in Treatment: 4 Active Inactive Abuse / Safety / Falls / Self Care Management Nursing Diagnoses: Potential for falls Goals: Patient will remain injury free Date Initiated: 09/21/2016 Goal Status: Active Interventions: Assess fall risk on admission and as needed Notes: Nutrition Nursing Diagnoses: Imbalanced nutrition Potential for alteratiion in Nutrition/Potential for imbalanced nutrition Goals: Patient/caregiver agrees to and verbalizes understanding of need to use nutritional supplements and/or vitamins as prescribed Date Initiated: 09/21/2016 Goal Status: Active Patient/caregiver verbalizes understanding of need to maintain therapeutic glucose control per primary care physician Date Initiated: 09/21/2016 Goal Status: Active Interventions: Assess patient nutrition upon admission and as needed per  policy Notes: Capelle, [place] ([id number]) Orientation to the [name] Nursing Diagnoses: Knowledge deficit related to the [name] program Goals: Patient/caregiver will verbalize understanding of the [place] Date Initiated: 09/21/2016 Goal Status: Active Interventions: Provide education on orientation to the [name] center Notes: Pain, Acute or Chronic Nursing Diagnoses: Pain, acute or chronic: actual or potential Potential alteration in comfort, pain Goals: Patient will verbalize adequate pain control and receive pain control interventions during procedures as needed Date Initiated: 09/21/2016 Goal Status: Active Patient/caregiver will verbalize adequate pain control between visits Date Initiated: 09/21/2016 Goal Status: Active Patient/caregiver will verbalize comfort level met Date Initiated: 09/21/2016 Goal Status: Active Interventions: Assess comfort goal upon admission Complete pain assessment as per visit requirements Notes: [name]/Skin Impairment Nursing Diagnoses: Impaired tissue integrity Goals: Ulcer/skin breakdown will have a volume reduction of 30% by week 4 Date Initiated: 09/21/2016 [name], [name] ([id number]) Goal Status: Active Ulcer/skin breakdown will have a volume  reduction of 50% by week 8 Date Initiated: 09/21/2016 Goal Status: Active Ulcer/skin breakdown will have a volume reduction of 80% by week 12 Date Initiated: 09/21/2016 Goal Status: Active Interventions: Assess ulceration(s) every visit Notes: Electronic Signature(s) Signed: 10/19/2016 5:37:51 PM By: Alric Quan Entered By: Alric Quan on 10/19/2016 09:27:24 [name], [name] ([id number]) -------------------------------------------------------------------------------- Pain Assessment Details Patient Name: [name] Date of Service: 10/19/2016 8:00 AM Medical Record Patient Account Number: [id number] Number: Treating RN:  Ahmed Prima 12-02-1961 (55 y.o. Other Clinician: Date of Birth/Sex: Female) Treating ROBSON, MICHAEL Primary Care Physician: Lavera Guise Physician/Extender: G Referring Physician: Sallee Lange in Treatment: 4 Active Problems Location of Pain Severity and Description of Pain Patient Has Paino No Site Locations With Dressing Change: No Pain Management and Medication Current Pain Management: Electronic Signature(s) Signed: 10/19/2016 5:37:51 PM By: Alric Quan Entered By: Alric Quan on 10/19/2016 08:27:29 [name], [name] ([id number]) -------------------------------------------------------------------------------- Patient/Caregiver Education Details Patient Name: [name] Date of Service: 10/19/2016 8:00 AM Medical Record Patient Account Number: [id number] Number: Treating RN: Ahmed Prima [birth year] (55 y.o. Other Clinician: Date of Birth/Gender: Female) Treating ROBSON, MICHAEL Primary Care Physician: Lavera Guise Physician/Extender: G Referring Physician: Sallee Lange in Treatment: 4 Education Assessment Education Provided To: Patient Education Topics Provided [name]/Skin Impairment: Handouts: Other: change dressing as ordered Methods: Demonstration, Explain/Verbal Responses: State content correctly Electronic Signature(s) Signed: 10/19/2016 5:37:51 PM By: Alric Quan Entered By: Alric Quan on 10/19/2016 09:20:07 [name], [place] ([id number]) -------------------------------------------------------------------------------- [name] Assessment Details Patient Name: [name] Date of Service: 10/19/2016 8:00 AM Medical Record Patient Account Number: [id number] Number: Treating RN: Ahmed Prima 1961/05/21 (54 y.o. Other Clinician: Date of Birth/Sex: Female) Treating ROBSON, [place] Primary Care Physician: Lavera Guise Physician/Extender: G Referring Physician: Sallee Lange in Treatment:  4 [name] Status [name] Number: 3 Primary Pressure Ulcer Etiology: [name] Location: Right Gluteal fold [name] Status: Open Wounding Event: Pressure Injury Comorbid Asthma, Hypertension, Type II Date Acquired: 04/21/2016 History: Diabetes, Neuropathy Weeks Of Treatment: 4 Clustered [name]: No Photos Photo Uploaded By: Alric Quan on 10/19/2016 09:10:38 [name] Measurements Length: (cm) 1.7 Width: (cm) 1.4 Depth: (cm) 6 Area: (cm) 1.869 Volume: (cm) 11.215 % Reduction in Area: 39.1% % Reduction in Volume: 34.8% Epithelialization: None Tunneling: No Undermining: No [name] Description Classification: Category/Stage IV [name] Margin: Distinct, outline attached Exudate Amount: Large Exudate Type: Purulent Exudate Color: yellow, brown, green Foul Odor After Cleansing: Yes Due to Product Use: No [name] Bed Granulation Amount: Medium (34-66%) Exposed Structure Granulation Quality: Pink, Pale Fascia Exposed: Yes Necrotic Amount: Medium (34-66%) Fat Layer Exposed: Yes Crocker, [name] ([id number]) Necrotic Quality: Adherent Slough Tendon Exposed: No Muscle Exposed: Yes Necrosis of Muscle: No Joint Exposed: No Bone Exposed: No Periwound Skin Texture Texture Color No Abnormalities Noted: No No Abnormalities Noted: No Moisture Temperature / Pain No Abnormalities Noted: No Temperature: No Abnormality Maceration: Yes Tenderness on Palpation: Yes Moist: Yes [name] Preparation Ulcer Cleansing: Rinsed/Irrigated with Saline Topical Anesthetic Applied: Other: lidocaine 4%, Treatment Notes [name] #3 (Right Gluteal fold) 1. Cleansed with: Clean [name] with Normal Saline 2. Anesthetic Topical Lidocaine 4% cream to [name] bed prior to debridement 3. Peri-[name] Care: Antifungal cream Skin Prep 5. Secondary Dressing Applied Bordered Foam Dressing Dry Gauze Notes silver alginate Electronic Signature(s) Signed: 10/19/2016 5:37:51 PM By: Alric Quan Entered By: Alric Quan on 10/19/2016 08:43:08 Grismer, [place] ([id number]) -------------------------------------------------------------------------------- [name] Assessment Details Patient Name: [name] Date of Service: 10/19/2016 8:00 AM Medical Record Patient  Account Number: [id number] Number: Treating RN: Ahmed Prima [birth year] (55 y.o. Other Clinician: Date of Birth/Sex: Female) Treating ROBSON, [place] Primary Care Physician: Lavera Guise Physician/Extender: G Referring Physician: Sallee Lange in Treatment: 4 [name] Status [name] Number: 4 Primary Pressure Ulcer Etiology: [name] Location: Left Gluteal fold [name] Status: Open Wounding Event: Pressure Injury Comorbid Asthma, Hypertension, Type II Date Acquired: 04/21/2016 History: Diabetes, Neuropathy Weeks Of Treatment: 4 Clustered [name]: No Photos Photo Uploaded By: Alric Quan on 10/19/2016 09:10:39 [name] Measurements Length: (cm) 1.2 Width: (cm) 3.3 Depth: (cm) 0.1 Area: (cm) 3.11 Volume: (cm) 0.311 % Reduction in Area: 59.9% % Reduction in Volume: 97.9% Epithelialization: None Tunneling: No Undermining: No [name] Description Classification: Category/Stage IV [name] Margin: Distinct, outline attached Exudate Amount: Large Exudate Type: Serous Exudate Color: amber Foul Odor After Cleansing: Yes Due to Product Use: No [name] Bed Granulation Amount: Large (67-100%) Exposed Structure Granulation Quality: Red, Pink Fascia Exposed: No Necrotic Amount: Small (1-33%) Fat Layer Exposed: Yes Burgener, Tennille L. ([id number]) Necrotic Quality: Adherent Slough Tendon Exposed: No Muscle Exposed: Yes Necrosis of Muscle: No Periwound Skin Texture Texture Color No Abnormalities Noted: No No Abnormalities Noted: No Moisture Temperature / Pain No Abnormalities Noted: No Temperature: No Abnormality Maceration: Yes Tenderness on Palpation: Yes Moist: Yes [name] Preparation Ulcer Cleansing: Rinsed/Irrigated  with Saline Topical Anesthetic Applied: Other: lidocaine 4%, Treatment Notes [name] #4 (Left Gluteal fold) 1. Cleansed with: Clean [name] with Normal Saline 2. Anesthetic Topical Lidocaine 4% cream to [name] bed prior to debridement 3. Peri-[name] Care: Antifungal cream Skin Prep 5. Secondary Dressing Applied Bordered Foam Dressing Dry Gauze Notes silver alginate Electronic Signature(s) Signed: 10/19/2016 5:37:51 PM By: Alric Quan Entered By: Alric Quan on 10/19/2016 08:41:39 Timm, [place] Carlean [name] ([id number]) -------------------------------------------------------------------------------- [name] Assessment Details Patient Name: [name] Date of Service: 10/19/2016 8:00 AM Medical Record Patient Account Number: [id number] Number: Treating RN: Ahmed Prima 11-25-1961 (55 y.o. Other Clinician: Date of Birth/Sex: Female) Treating ROBSON, MICHAEL Primary Care Physician: Lavera Guise Physician/Extender: G Referring Physician: Sallee Lange in Treatment: 4 [name] Status [name] Number: 5 Primary Diabetic [name]/Ulcer of the Lower Etiology: Extremity [name] Location: Left Knee - Posterior [name] Status: Open Wounding Event: Gradually Appeared Comorbid Asthma, Hypertension, Type II Date Acquired: 07/15/2016 History: Diabetes, Neuropathy Weeks Of Treatment: 4 Clustered [name]: No Photos Photo Uploaded By: Alric Quan on 10/19/2016 09:10:56 [name] Measurements Length: (cm) 0.3 Width: (cm) 0.3 Depth: (cm) 0.6 Area: (cm) 0.071 Volume: (cm) 0.042 % Reduction in Area: 85.7% % Reduction in Volume: 99.4% Epithelialization: None Tunneling: Yes Position (o'clock): 12 Maximum Distance: (cm) 15 Undermining: No [name] Description Classification: Grade 2 Foul Odor After [name] Margin: Flat and Intact Due to Product Exudate Amount: Large Exudate Type: Purulent Exudate Color: yellow, brown, green Cleansing: Yes Use: No [name] Bed Defilippo, Jacquelina L.  ([id number]) Granulation Amount: Medium (34-66%) Exposed Structure Granulation Quality: Pink Fascia Exposed: No Necrotic Amount: Medium (34-66%) Fat Layer Exposed: Yes Necrotic Quality: Adherent Slough Tendon Exposed: No Muscle Exposed: Yes Necrosis of Muscle: No Joint Exposed: No Bone Exposed: No Periwound Skin Texture Texture Color No Abnormalities Noted: No No Abnormalities Noted: No Callus: No Atrophie Blanche: No Crepitus: No Cyanosis: No Excoriation: No Ecchymosis: No Fluctuance: No Erythema: Yes Friable: No Erythema Location: Circumferential Induration: No Hemosiderin Staining: No Localized Edema: Yes Mottled: No Rash: No Pallor: No Scarring: No Rubor: Yes Moisture Temperature / Pain No Abnormalities Noted: No Temperature: Hot Dry / Scaly: No Tenderness on Palpation: Yes Maceration: Yes Moist: Yes [name]  Preparation Ulcer Cleansing: Rinsed/Irrigated with Saline Topical Anesthetic Applied: Other: lidocaine 4%, Treatment Notes [name] #5 (Left, Posterior Knee) 1. Cleansed with: Clean [name] with Normal Saline 2. Anesthetic Topical Lidocaine 4% cream to [name] bed prior to debridement 3. Peri-[name] Care: Antifungal cream Skin Prep 5. Secondary Dressing Applied Bordered Foam Dressing Dry Gauze Notes silver alginate Corbit, Maddalena L. ([id number]) Electronic Signature(s) Signed: 10/19/2016 5:37:51 PM By: Alric Quan Entered By: Alric Quan on 10/19/2016 08:42:58 Dowda, Louellen Carlean [name] ([id number]) -------------------------------------------------------------------------------- [facility] Details Patient Name: [name] Date of Service: 10/19/2016 8:00 AM Medical Record Patient Account Number: [id number] Number: Treating RN: Ahmed Prima [birth year] (55 y.o. Other Clinician: Date of Birth/Sex: Female) Treating ROBSON, MICHAEL Primary Care Physician: Lavera Guise Physician/Extender: G Referring Physician: Sallee Lange in Treatment:  4 Vital Signs Time Taken: 08:30 Temperature (F): 97.8 Height (in): 63 Pulse (bpm): 70 Weight (lbs): 257 Respiratory Rate (breaths/min): 18 Body Mass Index (BMI): 45.5 Blood Pressure (mmHg): 132/74 Reference Range: 80 - 120 mg / dl Electronic Signature(s) Signed: 10/19/2016 5:37:51 PM By: Alric Quan Entered By: Alric Quan on 10/19/2016 10:35:12

## 2016-10-20 NOTE — Progress Notes (Signed)
LENYX, BOODY ([id number]) Visit Report for 10/19/2016 Chief Complaint Document Details Patient Name: [name], [name] Date of Service: 10/19/2016 8:00 AM Medical Record Patient Account Number: [id number] Number: Treating RN: Phillis Haggis 20-Nov-1961 (55 y.o. Other Clinician: Date of Birth/Sex: Female) Treating ROBSON, MICHAEL Primary Care Physician/Extender: Leticia Penna Physician: Referring Physician: Tedra Senegal in Treatment: 4 Information Obtained from: Patient Chief Complaint Wounds over the right ischial area left gluteal area and on the lateral aspect of the left popliteal fossa Electronic Signature(s) Signed: 10/19/2016 6:13:30 PM By: Baltazar Najjar MD Entered By: Baltazar Najjar on 10/19/2016 12:43:11 Bouchillon, [name] ([id number]) -------------------------------------------------------------------------------- HPI Details Patient Name: [name] Date of Service: 10/19/2016 8:00 AM Medical Record Patient Account Number: [id number] Number: Treating RN: Phillis Haggis [birth year] (55 y.o. Other Clinician: Date of Birth/Sex: Female) Treating ROBSON, MICHAEL Primary Care Physician/Extender: [name], [name] Physician: Referring Physician: Tedra Senegal in Treatment: 4 History of Present Illness Location: bilateral gluteal ulcerations and left popliteal ulceration with tunneling Quality: Patient reports experiencing a dull pain to affected area(s). Severity: Patient states wound are getting worse. Duration: Patient has had the wound for < 2 weeks prior to presenting for treatment Timing: The pain is intermittent in severity as far as how intense it becomes but is present all the time. Manipulationn makes this worse. Context: The wound occurred when the patient had a fall and was unconscious for about 48 hours laying on the floor and she had pressure injury at that stage. Modifying Factors: Other treatment(s) tried include:as noted  below she has been seen by visiting wound care physicians or nurse practitioners and details have been noted Associated Signs and Symptoms: Patient reports having difficulty standing for long periods. HPI Description: 55 year old patient who was seen by visiting [name] wound care specialist for a wound on both her buttock and was found to have an unstageable wound on the right buttock for about 2 months. I understand that she had a fall and was laying on the floor for about 48 hours before she was found and taken to the ICU and had a long injury to her gluteal area from pressure and also had broken her right humerus. She has had a right proximal humerus fracture and has been followed up with orthopedics recently. The patient has a past medical history of type 2 diabetes mellitus, paraparesis, acute pyelonephritis, GERD, hypertension, glaucoma, chronic pain, anxiety neurosis, nicotine dependence, COPD. the patient had some debridement done and was to operative was recommended to use Silvadene dressing and offloading. She is a smoker and occasionally smokes a few cigarettes. the patient requested a second opinion for months and is here to discuss her care. 09/21/16; the patient re-presents from home today for review of 3 different wounds. I note that she was seen in the clinic here in July at which time she had bilateral buttock wounds. It was apparently suggested at that time that she use a wound VAC bridged to both wounds just near the initial tuberosity's bilaterally which she refused. The history was a bit difficult to put together. Apparently this patient became ill at the end of April of this year. She was found sitting on the floor she had apparently been for 2 days and subsequently admitted to hospital from 04/23/16 through 05/02/16 and at that point she was critically ill ultimately having sepsis secondary to UTI, nontraumatic rhabdomyolysis and diabetic ketoacidosis. She had acute renal  failure and I think required ICU care including intubation. Patient  states her wounds actually started at that point on the bilateral issue tuberosities however in reviewing the discharge summary from 5/8 I see no reference to wounds at that point. It did state that she had left lower extremity cellulitis however. Reviewing Epic I see no relevant x-rays. It would appear that her discharge creatinine was within the normal range and indeed on Suchocki, Makinzee L. ([id number]) 9/15 her creatinine has remained normal. [facility] There the wounds on her bilateral Botox were dressed. Only just before her discharge from the nursing facility she developed an "knot" which was interpreted as cellulitis on the posterior aspect of her left knee she was given antibiotics. Apparently sometime late in July a this actually opened and became a wound at home health care was tending to however she is still having purulent drainage coming from this and by my understanding the wound depth is actually become unmeasurable. I am not really clear about what home health has been placing in any of these wound areas. The patient states that is something with silver and it. She is not been systemically unwell no fever or chills her appetite is good. She is a diabetic poorly controlled however she states that her recent blood sugars at home have been in the low to mid 100s. 09/28/16 On evaluation today patient appears to continue to exhibit the 3 areas of ulceration that were noted previous. She did have an x-ray of the right pelvis which showed evidence of potential soft tissue infection but no obvious osteomyelitis. There was a discussion last office visit concerning the possibility of a wound VAC. Witth that being said the x-ray report suggested that an MRI may be more appropriate to further evaluate the extent. Subsequently in regard to the wound over the popliteal  portion of the left lower extremity with tunneling at 12:00 the CT scan that was ordered was denied by insurance as they state the patient has not had x-rays prior to advanced imaging. Patient states that she is frustrated with the situation overall. 10/05/16 in the interval since I last saw this patient last week she has had the x-ray of the knee performed. I did review that x-ray today and fortunately shows no evidence of osteomyelitis or other acute abnormality at this point in time. She continues to have the opening iin the posterior oral popliteal space with tracking proximal up the posterior thigh. Nothing seems to have worsened but it also seems to have not improved. The same is true in regard to the right pressure ulcer over the gluteal region which extends toward the ischium. The left gluteal pressure ulcer actually appears to be doing somewhat better my opinion there is some necrotic slough but overall this appears fairly well. She tells me thatt she has some discomfort especially when home health is helping her with dressing changes as they do not know her. At worse she rates her pain to be a 5 out of 10 right now it's more like a 1 out of 10. 10/07/16; still the patient has 3 different wound areas. She has a deep stage IV wound over her right ischial tuberosity. She is due to have an MRI next week. The wound over her left ischial tuberosity is more superficial and underwent debridement today. Finally she has a small open area in her left popliteal fossa the probes on measurably forward superiorly. Still a lot of drainage coming out of this. The CT scan that I ordered  3 weeks ago was questioned by her insurance company wanting a plain x-ray first. As I understand things result of this is nothing has been done in 3 weeks in terms of imaging the thigh and she has an MRI booked of this along with her pelvis for next week line 10/12/16; patient has a deep probing wound over the right  ischiall tuberosity, stage III wound over the left visual tuberosity and a draining sinus in her left popliteal fossa. None of this much different from when I saw this 3 weeks ago. We have been using silver rope to the right ischial wound and a draining area in the left popliteal fossa. Plain silver alginate to the area on the left ischial tuberosity 10/19/16; the patient's wounds are essentially unchanged although the area on the left lower gluteal is actually improved. Our intake nurse noted drainage from the right initial tuberosity probing wound as well as the draining area in the left popliteal fossa. Both of these were cultured. She had x-rays I think at the insistence of her insurance company on 09/23/16 x-ray of the pelvis was not particularly helpful she did have soft tissue air over the right lower pelvis although with the depth of this wound this is not surprising. An x-ray of her left knee did not show any specific abnormalities. We are still using silver alginate to these wound areas. Her MRI is booked for 10/27 Electronic Signature(s) TABITHA, SPEISER ([id number]) Signed: 10/19/2016 6:13:30 PM By: Baltazar Najjar MD Entered By: Baltazar Najjar on 10/19/2016 12:45:24 Shankle, [name] ([id number]) -------------------------------------------------------------------------------- Physical Exam Details Patient Name: [name] Date of Service: 10/19/2016 8:00 AM Medical Record Patient Account Number: [id number] Number: Treating RN: Phillis Haggis January 09, 1961 (55 y.o. Other Clinician: Date of Birth/Sex: Female) Treating ROBSON, MICHAEL Primary Care Physician/Extender: Leticia Penna Physician: Referring Physician: Tedra Senegal in Treatment: 4 Constitutional Sitting or standing Blood Pressure is within target range for patient.. Pulse regular and within target range for patient.Marland Kitchen Respirations regular, non-labored and within target range.. Temperature is normal and  within the target range for the patient.. Eyes Conjunctivae clear. No discharge.Marland Kitchen Respiratory Respiratory effort is easy and symmetric bilaterally. Rate is normal at rest and on room air.. Gastrointestinal (GI) . Lymphatic Nonpalpable in the popliteal or inguinal area. Integumentary (Hair, Skin) Area of erythema around the left popliteal area looks like a tinea pedis issue. Psychiatric No evidence of depression, anxiety, or agitation. Calm, cooperative, and communicative. Appropriate interactions and affect.. Notes Wound exam; wounds are not much different from when I last saw this. The area on the left lower gluteal is actually some better and I think is smaller. She has a deep probing area over the right ischial tuberosity although this does not probe to bone. As mentioned a culture was done. The open area in the left popliteal fossa again probes superiorly more than we can actually measure. She had a serosanguineous drainage coming out of this. Both of the areas on the left popliteal and right ischial tuberosity were cultured today Electronic Signature(s) Signed: 10/19/2016 6:13:30 PM By: Baltazar Najjar MD Entered By: Baltazar Najjar on 10/19/2016 12:47:57 Macaraeg, [name] ([id number]) -------------------------------------------------------------------------------- Physician Orders Details Patient Name: [name] Date of Service: 10/19/2016 8:00 AM Medical Record Patient Account Number: [id number] Number: Treating RN: Phillis Haggis 1961-10-19 (55 y.o. Other Clinician: Date of Birth/Sex: Female) Treating ROBSON, MICHAEL Primary Care Physician/Extender: Leticia Penna Physician: Referring Physician: Tedra Senegal in Treatment: 4 Verbal / Phone  Orders: Yes Clinician: Pinkerton, Debi Read Back and Verified: Yes Diagnosis Coding Wound Cleansing Wound #3 Right Gluteal fold o Clean wound with Normal Saline. Wound #4 Left Gluteal fold o Clean wound  with Normal Saline. Wound #5 Left,Posterior Knee o Clean wound with Normal Saline. Anesthetic Wound #3 Right Gluteal fold o Topical Lidocaine 4% cream applied to wound bed prior to debridement - clinic use only Wound #4 Left Gluteal fold o Topical Lidocaine 4% cream applied to wound bed prior to debridement - clinic use only Wound #5 Left,Posterior Knee o Topical Lidocaine 4% cream applied to wound bed prior to debridement - clinic use only Skin Barriers/Peri-Wound Care Wound #3 Right Gluteal fold o Skin Prep o Antifungal cream - ketoconazole to around the wound areas Wound #4 Left Gluteal fold o Skin Prep o Antifungal cream - ketoconazole to around the wound areas Wound #5 Left,Posterior Knee o Skin Prep o Antifungal cream - ketoconazole to around the wound areas Primary Wound Dressing Habig, Kadeidra L. ([id number]) Wound #3 Right Gluteal fold o Aquacel Ag - or equivalent to Silver Alginate Rope pack lightly into wound and leave a tail where it can be pulled out and to cover the outside of the wound. o Sorbalgon Ag - used in clinic Wound #4 Left Gluteal fold o Aquacel Ag - Silver Alginate o Sorbalgon Ag - used in clinic Wound #5 Left,Posterior Knee o Aquacel Ag - Silver Alginate Rope pack lightly into wound and leave a tail where it can be pulled out and to cover the outside of the wound. o Sorbalgon Ag - used in clinic Secondary Dressing Wound #3 Right Gluteal fold o Dry Gauze o Non-adherent pad - Telfa island, tape, or BFD Wound #4 Left Gluteal fold o Dry Gauze o Non-adherent pad - Telfa island, tape, or BFD Wound #5 Left,Posterior Knee o Dry Gauze o Non-adherent pad - Telfa island, tape, or BFD Dressing Change Frequency Wound #3 Right Gluteal fold o Change dressing every other day. Wound #4 Left Gluteal fold o Change dressing every other day. Wound #5 Left,Posterior Knee o Change dressing every other day. Follow-up  Appointments Wound #3 Right Gluteal fold o Return Appointment in 1 week. Wound #4 Left Gluteal fold o Return Appointment in 1 week. Wound #5 Left,Posterior Knee o Return Appointment in 1 week. Off-Loading Scarfo, Nyeli L. ([id number]) Wound #3 Right Gluteal fold o Roho cushion for wheelchair - *********[facility] to order*********** o Turn and reposition every 2 hours - Museum/gallery curator *********[facility] to order*********** Wound #4 Left Gluteal fold o Roho cushion for wheelchair - *********[facility] to order*********** o Turn and reposition every 2 hours o Mattress - Museum/gallery curator *********[facility] to order*********** Wound #5 Left,Posterior Knee o Roho cushion for wheelchair - *********[facility] to order*********** o Turn and reposition every 2 hours o Mattress - Museum/gallery curator *********[facility] to order*********** Home Health Wound #3 Right Gluteal fold o Continue Home Health Visits - [facility]/ [facility] *****Please order ROHO cushion and air mattress****** o Home Health Nurse may visit PRN to address patientos wound care needs. o FACE TO FACE ENCOUNTER: MEDICARE and MEDICAID PATIENTS: I certify that this patient is under my care and that I had a face-to-face encounter that meets the physician face-to-face encounter requirements with this patient on this date. The encounter with the patient was in whole or in part for the following MEDICAL CONDITION: (primary reason for Home Healthcare) MEDICAL NECESSITY: I certify, that based  on my findings, NURSING services are a medically necessary home health service. HOME BOUND STATUS: I certify that my clinical findings support that this patient is homebound (i.e., Due to illness or injury, pt requires aid of supportive devices such as crutches, cane, wheelchairs, walkers, the use of special transportation or the assistance of another person to leave their place of residence.  There is a normal inability to leave the home and doing so requires considerable and taxing effort. Other absences are for medical reasons / religious services and are infrequent or of short duration when for other reasons). o If current dressing causes regression in wound condition, may D/C ordered dressing product/s and apply Normal Saline Moist Dressing daily until next Wound Healing Center / Other MD appointment. Notify Wound Healing Center of regression in wound condition at [phone]. o Please direct any NON-WOUND related issues/requests for orders to patient's Primary Care Physician Wound #4 Left Gluteal fold o Continue Home Health Visits - [facility] *****Please order ROHO cushion and air mattress****** o Home Health Nurse may visit PRN to address patientos wound care needs. o FACE TO FACE ENCOUNTER: MEDICARE and MEDICAID PATIENTS: I certify that this patient is under my care and that I had a face-to-face encounter that meets the physician face-to-face encounter requirements with this patient on this date. The encounter with the patient was in whole or in part for the following MEDICAL CONDITION: (primary reason for Home Healthcare) MEDICAL NECESSITY: I certify, that based on my findings, NURSING services are a medically Beckstrand, Garrett L. ([id number]) necessary home health service. HOME BOUND STATUS: I certify that my clinical findings support that this patient is homebound (i.e., Due to illness or injury, pt requires aid of supportive devices such as crutches, cane, wheelchairs, walkers, the use of special transportation or the assistance of another person to leave their place of residence. There is a normal inability to leave the home and doing so requires considerable and taxing effort. Other absences are for medical reasons / religious services and are infrequent or of short duration when for other reasons). o If current dressing causes regression in  wound condition, may D/C ordered dressing product/s and apply Normal Saline Moist Dressing daily until next Wound Healing Center / Other MD appointment. Notify Wound Healing Center of regression in wound condition at [phone]. o Please direct any NON-WOUND related issues/requests for orders to patient's Primary Care Physician Wound #5 Left,Posterior Knee o Continue Home Health Visits - [facility] Nurse may visit PRN to address patientos wound care needs. o FACE TO FACE ENCOUNTER: MEDICARE and MEDICAID PATIENTS: I certify that this patient is under my care and that I had a face-to-face encounter that meets the physician face-to-face encounter requirements with this patient on this date. The encounter with the patient was in whole or in part for the following MEDICAL CONDITION: (primary reason for Home Healthcare) MEDICAL NECESSITY: I certify, that based on my findings, NURSING services are a medically necessary home health service. HOME BOUND STATUS: I certify that my clinical findings support that this patient is homebound (i.e., Due to illness or injury, pt requires aid of supportive devices such as crutches, cane, wheelchairs, walkers, the use of special transportation or the assistance of another person to leave their place of residence. There is a normal inability to leave the home and doing so requires considerable and taxing effort. Other absences are for medical reasons / religious services and are infrequent or of short  duration when for other reasons). o If current dressing causes regression in wound condition, may D/C ordered dressing product/s and apply Normal Saline Moist Dressing daily until next Wound Healing Center / Other MD appointment. Notify Wound Healing Center of regression in wound condition at [phone]. o Please direct any NON-WOUND related issues/requests for orders to patient's Primary Care Physician Medications-please  add to medication list. Wound #3 Right Gluteal fold o Other: - Vitamin C, Zinc, Multivitamin ketoconazole Wound #4 Left Gluteal fold o Other: - Vitamin C, Zinc, Multivitamin ketoconazole Wound #5 Left,Posterior Knee o Other: - Vitamin C, Zinc, Multivitamin ketoconazole Laboratory Domeier, Moe L. ([id number]) o Bacteria identified in Wound by Culture (MICRO) - Right Gluteal Fold and Left Posterior Knee oooo LOINC Code: 6462-6 oooo Convenience Name: Wound culture routine Electronic Signature(s) Signed: 10/19/2016 5:37:51 PM By: Alejandro Mulling Signed: 10/19/2016 6:13:30 PM By: Baltazar Najjar MD Entered By: Alejandro Mulling on 10/19/2016 09:35:32 Fischbach, [name] ([id number]) -------------------------------------------------------------------------------- Problem List Details Patient Name: [name] Date of Service: 10/19/2016 8:00 AM Medical Record Patient Account Number: [id number] Number: Treating RN: Phillis Haggis 08/18/1961 (55 y.o. 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 Other Clinician: Date of Birth/Sex: Female) Treating ROBSON, MICHAEL Primary Care Physician/Extender: [name], [name] Physician: Referring Physician: Tedra Senegal in Treatment: 4 Subjective Chief Complaint Information obtained from Patient Wounds over the right ischial area left gluteal area and on the lateral aspect of the left popliteal fossa History of Present Illness (HPI) The following HPI elements were documented for the patient's wound: Location: bilateral gluteal ulcerations and left popliteal ulceration with tunneling Quality: Patient reports experiencing a dull pain to affected area(s). Severity: Patient states wound are getting worse. Duration: Patient has had the wound for < 2 weeks prior to presenting for treatment Timing: The pain is intermittent in severity as far as how intense it becomes but is present all the time. Manipulationn makes this worse. Context: The wound occurred when the patient had a fall and was unconscious for about 48 hours laying on the floor and she had pressure injury at that stage. Modifying Factors: Other treatment(s) tried include:as noted below she has been seen by visiting wound care physicians or nurse practitioners and details have been noted Associated Signs and Symptoms: Patient reports having difficulty standing for long periods. 55 year old patient who was seen by visiting [name] wound care specialist for a wound on both her buttock and was found to have an unstageable wound on the right buttock for about 2 months. I understand that she had a fall and was laying on the floor for about 48 hours before she was found and taken to the ICU and had a long injury to her gluteal area from pressure and also had broken her right humerus. She  has had a right proximal humerus fracture and has been followed up with orthopedics recently. The  patient has a past medical history of type 2 diabetes mellitus, paraparesis, acute pyelonephritis, GERD, hypertension, glaucoma, chronic pain, anxiety neurosis, nicotine dependence, COPD. the patient had some debridement done and was to operative was recommended to use Silvadene dressing and offloading. She is a smoker and occasionally smokes a few cigarettes. the patient requested a second opinion for months and is here to discuss her care. 09/21/16; the patient re-presents from home today for review of 3 different wounds. I note that she was seen in the clinic here in July at which time she had bilateral buttock wounds. It was apparently suggested at that time that she use a wound VAC bridged to both wounds just near the initial tuberosity's bilaterally which she refused. Rajkumar, Riverlyn L. ([id number]) The history was a bit difficult to put together. Apparently this patient became ill at the end of April of this year. She was found sitting on the floor she had apparently been for 2 days and subsequently admitted to hospital from 04/23/16 through 05/02/16 and at that point she was critically ill ultimately having sepsis secondary to UTI, nontraumatic rhabdomyolysis and diabetic ketoacidosis. She had acute renal failure and I think required ICU care including intubation. Patient states her wounds actually started at that point on the bilateral issue tuberosities however in reviewing the discharge summary from 5/8 I see no reference to wounds at that point. It did state that she had left lower extremity cellulitis however. Reviewing Epic I see no relevant x-rays. It would appear that her discharge creatinine was within the normal range and indeed on 9/15 her creatinine has remained normal. [facility] There the wounds on her bilateral Botox were dressed. Only just before her discharge from the nursing facility she developed an "knot" which was  interpreted as cellulitis on the posterior aspect of her left knee she was given antibiotics. Apparently sometime late in July a this actually opened and became a wound at home health care was tending to however she is still having purulent drainage coming from this and by my understanding the wound depth is actually become unmeasurable. I am not really clear about what home health has been placing in any of these wound areas. The patient states that is something with silver and it. She is not been systemically unwell no fever or chills her appetite is good. She is a diabetic poorly controlled however she states that her recent blood sugars at home have been in the low to mid 100s. 09/28/16 On evaluation today patient appears to continue to exhibit the 3 areas of ulceration that were noted previous. She did have an x-ray of the right pelvis which showed evidence of potential soft tissue infection but no obvious osteomyelitis. There was a discussion last office visit concerning the possibility of a wound VAC. Witth that being said the x-ray report suggested that an MRI may be more appropriate to further evaluate the extent. Subsequently in regard to the wound over the popliteal portion of the left lower extremity with tunneling at 12:00 the CT scan that was ordered was denied by insurance as they state the patient has not had x-rays prior to advanced imaging. Patient states that she is frustrated with the situation overall. 10/05/16 in the interval since I last saw this patient last week she has had the x-ray of the knee performed. I did  review that x-ray today and fortunately shows no evidence of osteomyelitis or other acute abnormality at this point in time. She continues to have the opening iin the posterior oral popliteal space with tracking proximal up the posterior thigh. Nothing seems to have worsened but it also seems to have not improved. The same is true in regard to the right pressure  ulcer over the gluteal region which extends toward the ischium. The left gluteal pressure ulcer actually appears to be doing somewhat better my opinion there is some necrotic slough but overall this appears fairly well. She tells me thatt she has some discomfort especially when home health is helping her with dressing changes as they do not know her. At worse she rates her pain to be a 5 out of 10 right now it's more like a 1 out of 10. 10/07/16; still the patient has 3 different wound areas. She has a deep stage IV wound over her right ischial tuberosity. She is due to have an MRI next week. The wound over her left ischial tuberosity is more superficial and underwent debridement today. Finally she has a small open area in her left popliteal fossa the probes on measurably forward superiorly. Still a lot of drainage coming out of this. The CT scan that I ordered 3 weeks ago was questioned by her insurance company wanting a plain x-ray first. As I understand things result of this is nothing has been done in 3 weeks in terms of imaging the thigh and she has an MRI booked of this along with her pelvis for next week line 10/12/16; patient has a deep probing wound over the right ischiall tuberosity, stage III wound over the left visual tuberosity and a draining sinus in her left popliteal fossa. None of this much different from when I saw this 3 weeks ago. We have been using silver rope to the right ischial wound and a draining area in the left popliteal fossa. Plain silver alginate to the area on the left ischial tuberosity 10/19/16; the patient's wounds are essentially unchanged although the area on the left lower gluteal is actually improved. Our intake nurse noted drainage from the right initial tuberosity probing wound as well as Posthumus, Ayushi L. ([id number]) the draining area in the left popliteal fossa. Both of these were cultured. She had x-rays I think at the insistence of her insurance company  on 09/23/16 x-ray of the pelvis was not particularly helpful she did have soft tissue air over the right lower pelvis although with the depth of this wound this is not surprising. An x-ray of her left knee did not show any specific abnormalities. We are still using silver alginate to these wound areas. Her MRI is booked for 10/27 Objective Constitutional Sitting or standing Blood Pressure is within target range for patient.. Pulse regular and within target range for patient.Marland Kitchen Respirations regular, non-labored and within target range.. Temperature is normal and within the target range for the patient.. Vitals Time Taken: 8:30 AM, Height: 63 in, Weight: 257 lbs, BMI: 45.5, Temperature: 97.8 F, Pulse: 70 bpm, Respiratory Rate: 18 breaths/min, Blood Pressure: 132/74 mmHg. Eyes Conjunctivae clear. No discharge.Marland Kitchen Respiratory Respiratory effort is easy and symmetric bilaterally. Rate is normal at rest and on room air.Marland Kitchen Lymphatic Nonpalpable in the popliteal or inguinal area. Psychiatric No evidence of depression, anxiety, or agitation. Calm, cooperative, and communicative. Appropriate interactions and affect.. General Notes: Wound exam; wounds are not much different from when I last saw this. The area on the  left lower gluteal is actually some better and I think is smaller. She has a deep probing area over the right ischial tuberosity although this does not probe to bone. As mentioned a culture was done. The open area in the left popliteal fossa again probes superiorly more than we can actually measure. She had a serosanguineous drainage coming out of this. Both of the areas on the left popliteal and right ischial tuberosity were cultured today Integumentary (Hair, Skin) Area of erythema around the left popliteal area looks like a tinea pedis issue. Wound #3 status is Open. Original cause of wound was Pressure Injury. The wound is located on the Right Gluteal fold. The wound measures 1.7cm  length x 1.4cm width x 6cm depth; 1.869cm^2 area and 11.215cm^3 volume. There is muscle, fat, and fascia exposed. There is no tunneling or undermining noted. There is a large amount of purulent drainage noted. The wound margin is distinct with the outline attached Go, Charley L. ([id number]) to the wound base. There is medium (34-66%) pink, pale granulation within the wound bed. There is a medium (34-66%) amount of necrotic tissue within the wound bed including Adherent Slough. The periwound skin appearance exhibited: Maceration, Moist. Periwound temperature was noted as No Abnormality. The periwound has tenderness on palpation. Wound #4 status is Open. Original cause of wound was Pressure Injury. The wound is located on the Left Gluteal fold. The wound measures 1.2cm length x 3.3cm width x 0.1cm depth; 3.11cm^2 area and 0.311cm^3 volume. There is muscle and fat exposed. There is no tunneling or undermining noted. There is a large amount of serous drainage noted. The wound margin is distinct with the outline attached to the wound base. There is large (67-100%) red, pink granulation within the wound bed. There is a small (1-33%) amount of necrotic tissue within the wound bed including Adherent Slough. The periwound skin appearance exhibited: Maceration, Moist. Periwound temperature was noted as No Abnormality. The periwound has tenderness on palpation. Wound #5 status is Open. Original cause of wound was Gradually Appeared. The wound is located on the Left,Posterior Knee. The wound measures 0.3cm length x 0.3cm width x 0.6cm depth; 0.071cm^2 area and 0.042cm^3 volume. There is muscle and fat exposed. There is no undermining noted, however, there is tunneling at 12:00 with a maximum distance of 15cm. There is a large amount of purulent drainage noted. The wound margin is flat and intact. There is medium (34-66%) pink granulation within the wound bed. There is a medium (34-66%) amount of necrotic  tissue within the wound bed including Adherent Slough. The periwound skin appearance exhibited: Localized Edema, Maceration, Moist, Rubor, Erythema. The periwound skin appearance did not exhibit: Callus, Crepitus, Excoriation, Fluctuance, Friable, Induration, Rash, Scarring, Dry/Scaly, Atrophie Blanche, Cyanosis, Ecchymosis, Hemosiderin Staining, Mottled, Pallor. The surrounding wound skin color is noted with erythema which is circumferential. Periwound temperature was noted as Hot. The periwound has tenderness on palpation. 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 Wound #4 Left Gluteal fold: Bonser, Myrl L. ([id number]) Clean wound with Normal Saline. Wound #5 Left,Posterior Knee: Clean wound with Normal Saline. Anesthetic: Wound #3 Right Gluteal fold: Topical Lidocaine 4% cream applied to wound bed prior to debridement - clinic use only Wound #4  Left Gluteal fold: Topical Lidocaine 4% cream applied to wound bed prior to debridement - clinic use only Wound #5 Left,Posterior Knee: Topical Lidocaine 4% cream applied to wound bed prior to debridement - clinic use only Skin Barriers/Peri-Wound Care: Wound #3 Right Gluteal fold: Skin Prep Antifungal cream - ketoconazole to around the wound areas Wound #4 Left Gluteal fold: Skin Prep Antifungal cream - ketoconazole to around the wound areas Wound #5 Left,Posterior Knee: Skin Prep Antifungal cream - ketoconazole to around the wound areas Primary Wound Dressing: Wound #3 Right Gluteal fold: Aquacel Ag - or equivalent to Silver Alginate Rope pack lightly into wound and leave a tail where it can be pulled out and to cover  the outside of the wound. Sorbalgon Ag - used in clinic Wound #4 Left Gluteal fold: Aquacel Ag - Silver Alginate Sorbalgon Ag - used in clinic Wound #5 Left,Posterior Knee: Aquacel Ag - Silver Alginate Rope pack lightly into wound and leave a tail where it can be pulled out and to cover the outside of the wound. Sorbalgon Ag - used in clinic Secondary Dressing: Wound #3 Right Gluteal fold: Dry Gauze Non-adherent pad - Telfa island, tape, or BFD Wound #4 Left Gluteal fold: Dry Gauze Non-adherent pad - Telfa island, tape, or BFD Wound #5 Left,Posterior Knee: Dry Gauze Non-adherent pad - Telfa island, tape, or BFD Dressing Change Frequency: Wound #3 Right Gluteal fold: Change dressing every other day. Wound #4 Left Gluteal fold: Change dressing every other day. Wound #5 Left,Posterior Knee: Change dressing every other day. Follow-up Appointments: Wound #3 Right Gluteal fold: Banner, Aryssa L. ([id number]) Return Appointment in 1 week. Wound #4 Left Gluteal fold: Return Appointment in 1 week. Wound #5 Left,Posterior Knee: Return Appointment in 1 week. Off-Loading: Wound #3 Right Gluteal fold: Roho cushion for wheelchair - *********[facility] to order*********** Turn and reposition every 2 hours - Museum/gallery curator *********[facility] to order*********** Wound #4 Left Gluteal fold: Roho cushion for wheelchair - *********[facility] to order*********** Turn and reposition every 2 hours Mattress - Museum/gallery curator *********[facility] to order*********** Wound #5 Left,Posterior Knee: Roho cushion for wheelchair - *********[facility] to order*********** Turn and reposition every 2 hours Mattress - Museum/gallery curator *********[facility] to order*********** Home Health: Wound #3 Right Gluteal fold: Continue Home Health Visits - [facility] *****Please order ROHO cushion and air mattress****** Home Health Nurse may visit PRN to address patient s wound care  needs. FACE TO FACE ENCOUNTER: MEDICARE and MEDICAID PATIENTS: I certify that this patient is under my care and that I had a face-to-face encounter that meets the physician face-to-face encounter requirements with this patient on this date. The encounter with the patient was in whole or in part for the following MEDICAL CONDITION: (primary reason for Home Healthcare) MEDICAL NECESSITY: I certify, that based on my findings, NURSING services are a medically necessary home health service. HOME BOUND STATUS: I certify that my clinical findings support that this patient is homebound (i.e., Due to illness or injury, pt requires aid of supportive devices such as crutches, cane, wheelchairs, walkers, the use of special transportation or the assistance of another person to leave their place of residence. There is a normal inability to leave the home and doing so requires considerable and taxing effort. Other absences are for medical reasons / religious services and are infrequent or of short duration when for other reasons). If current dressing causes regression in wound condition, may D/C ordered  dressing product/s and apply Normal Saline Moist Dressing daily until next Wound Healing Center / Other MD appointment. Notify Wound Healing Center of regression in wound condition at [phone]. Please direct any NON-WOUND related issues/requests for orders to patient's Primary Care Physician Wound #4 Left Gluteal fold: Continue Home Health Visits - [facility] Income *****Please order ROHO cushion and air mattress****** Home Health Nurse may visit PRN to address patient s wound care needs. FACE TO FACE ENCOUNTER: MEDICARE and MEDICAID PATIENTS: I certify that this patient is under my care and that I had a face-to-face encounter that meets the physician face-to-face encounter requirements with this patient on this date. The encounter with the patient was in whole or in part for the following MEDICAL  CONDITION: (primary reason for Home Healthcare) MEDICAL NECESSITY: I certify, that based on my findings, NURSING services are a medically necessary home health service. HOME BOUND STATUS: I certify that my clinical findings support that this patient is homebound (i.e., Due to illness or injury, pt requires aid of supportive devices such as crutches, cane, wheelchairs, walkers, the use of special transportation or the assistance of another person to leave their place of residence. There is a normal inability to leave the home and doing so requires considerable and taxing effort. Other absences are for medical reasons / religious services and are infrequent or of short duration when for other reasons). If current dressing causes regression in wound condition, may D/C ordered dressing product/s and apply Grave, Miyoko L. ([id number]) Normal Saline Moist Dressing daily until next Wound Healing Center / Other MD appointment. Notify Wound Healing Center of regression in wound condition at [phone]. Please direct any NON-WOUND related issues/requests for orders to patient's Primary Care Physician Wound #5 Left,Posterior Knee: Continue [facility] Health Visits - [facility] Home Health Nurse may visit PRN to address patient s wound care needs. FACE TO FACE ENCOUNTER: MEDICARE and MEDICAID PATIENTS: I certify that this patient is under my care and that I had a face-to-face encounter that meets the physician face-to-face encounter requirements with this patient on this date. The encounter with the patient was in whole or in part for the following MEDICAL CONDITION: (primary reason for Home Healthcare) MEDICAL NECESSITY: I certify, that based on my findings, NURSING services are a medically necessary home health service. HOME BOUND STATUS: I certify that my clinical findings support that this patient is homebound (i.e., Due to illness or injury, pt requires aid of supportive devices such as  crutches, cane, wheelchairs, walkers, the use of special transportation or the assistance of another person to leave their place of residence. There is a normal inability to leave the home and doing so requires considerable and taxing effort. Other absences are for medical reasons / religious services and are infrequent or of short duration when for other reasons). If current dressing causes regression in wound condition, may D/C ordered dressing product/s and apply Normal Saline Moist Dressing daily until next Wound Healing Center / Other MD appointment. Notify Wound Healing Center of regression in wound condition at [phone]. Please direct any NON-WOUND related issues/requests for orders to patient's Primary Care Physician Medications-please add to medication list.: Wound #3 Right Gluteal fold: Other: - Vitamin C, Zinc, Multivitamin ketoconazole Wound #4 Left Gluteal fold: Other: - Vitamin C, Zinc, Multivitamin ketoconazole Wound #5 Left,Posterior Knee: Other: - Vitamin C, Zinc, Multivitamin ketoconazole Laboratory ordered were: Wound culture routine - Right Gluteal Fold and Left Posterior Knee o #1 we continue with silver  alginate packing to the right initial tuberosity and left popliteal areas. #2 silver alginate to the left gluteal wound which is the one that appears to be improving #3 cultured areas including the right initial tuberosity and the left popliteal today. No empiric antibiotics #4 MRI booked for Friday 10/27. Based on this side think she may need consultations with infectious disease, surgery and/or interventional radiology. Winther, Caitland L. ([id number]) #5 Rash around the left popliteal wound looks like tinea corpis Electronic Signature(s) Signed: 10/19/2016 6:13:30 PM By: Baltazar Najjar MD Entered By: Baltazar Najjar on 10/19/2016 12:50:25 Duplessis, [name] ([id number]) -------------------------------------------------------------------------------- SuperBill  Details Patient Name: [name] Date of Service: 10/19/2016 Medical Record Patient Account Number: [id number] Number: Treating RN: Phillis Haggis 1961-11-09 (55 y.o. 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

## 2016-10-21 ENCOUNTER — Ambulatory Visit
Admission: RE | Admit: 2016-10-21 | Discharge: 2016-10-21 | Disposition: A | Discharge status: Home / Self Care | Payer: Managed Care, Other (non HMO) | Source: Ambulatory Visit | Attending: Physician Assistant | Admitting: Physician Assistant

## 2016-10-21 DIAGNOSIS — E1142 Type 2 diabetes mellitus with diabetic polyneuropathy: Secondary | ICD-10-CM

## 2016-10-21 DIAGNOSIS — L89323 Pressure ulcer of left buttock, stage 3: Secondary | ICD-10-CM | POA: Insufficient documentation

## 2016-10-21 DIAGNOSIS — M869 Osteomyelitis, unspecified: Secondary | ICD-10-CM | POA: Insufficient documentation

## 2016-10-21 DIAGNOSIS — L89314 Pressure ulcer of right buttock, stage 4: Secondary | ICD-10-CM

## 2016-10-21 DIAGNOSIS — L97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 severity: Secondary | ICD-10-CM

## 2016-10-21 MED ORDER — GADOBENATE DIMEGLUMINE 529 MG/ML IV SOLN
20.0000 mL | Freq: Once | INTRAVENOUS | Status: AC | PRN
  Administered 2016-10-21: 20 mL via INTRAVENOUS

## 2016-10-24 LAB — AEROBIC CULTURE  (SUPERFICIAL SPECIMEN)

## 2016-10-24 LAB — AEROBIC CULTURE W GRAM STAIN (SUPERFICIAL SPECIMEN)

## 2016-10-26 ENCOUNTER — Encounter: Payer: Managed Care, Other (non HMO) | Attending: Internal Medicine | Admitting: Internal Medicine

## 2016-10-26 DIAGNOSIS — F17210 Nicotine dependence, cigarettes, uncomplicated: Secondary | ICD-10-CM | POA: Diagnosis not present

## 2016-10-26 DIAGNOSIS — I10 Essential (primary) hypertension: Secondary | ICD-10-CM | POA: Insufficient documentation

## 2016-10-26 DIAGNOSIS — L8995 Pressure ulcer of unspecified site, unstageable: Secondary | ICD-10-CM

## 2016-10-26 DIAGNOSIS — L089 Local infection of the skin and subcutaneous tissue, unspecified: Secondary | ICD-10-CM | POA: Insufficient documentation

## 2016-10-26 DIAGNOSIS — E1142 Type 2 diabetes mellitus with diabetic polyneuropathy: Secondary | ICD-10-CM | POA: Diagnosis not present

## 2016-10-26 DIAGNOSIS — F411 Generalized anxiety disorder: Secondary | ICD-10-CM | POA: Diagnosis not present

## 2016-10-26 DIAGNOSIS — J449 Chronic obstructive pulmonary disease, unspecified: Secondary | ICD-10-CM | POA: Insufficient documentation

## 2016-10-26 DIAGNOSIS — L89314 Pressure ulcer of right buttock, stage 4: Secondary | ICD-10-CM | POA: Diagnosis not present

## 2016-10-26 DIAGNOSIS — L89323 Pressure ulcer of left buttock, stage 3: Secondary | ICD-10-CM | POA: Insufficient documentation

## 2016-10-26 DIAGNOSIS — L97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 severity: Secondary | ICD-10-CM | POA: Diagnosis not present

## 2016-10-26 DIAGNOSIS — K219 Gastro-esophageal reflux disease without esophagitis: Secondary | ICD-10-CM | POA: Diagnosis not present

## 2016-10-27 NOTE — Progress Notes (Signed)
[name], [name] Visit Report for 10/26/2016 Arrival Information Details Patient Name: [name] Date of Service: 10/26/2016 8:00 AM Medical Record Patient Account Number: [id number] Number: Treating RN: [name] February 23, 1961 (55 y.o. Other Clinician: Date of Birth/Sex: Female) Treating [name] Primary Care Physician: [name] Physician/Extender: G Referring Physician: Sallee [name] in Treatment: 5 Visit Information History Since Last Visit All ordered tests and consults were completed: No Patient Arrived: Wheel Chair Added or deleted any medications: No Arrival Time: 08:05 Any new allergies or adverse reactions: No Accompanied By: self Had a fall or experienced change in No Transfer Assistance: EasyPivot activities of daily living that may affect Patient Lift risk of falls: Patient Identification Verified: Yes Signs or symptoms of abuse/neglect since last No Secondary Verification Process Yes visito Completed: Hospitalized since last visit: No Patient Requires Transmission- No Pain Present Now: No Based Precautions: Patient Has Alerts: Yes Patient Alerts: DM II Electronic Signature(s) Signed: 10/26/2016 5:08:45 PM By: Alric Quan Entered By: Alric Quan on 10/26/2016 08:05:58 [name] ([id number]) -------------------------------------------------------------------------------- Clinic Level of Care Assessment Details Patient Name: [name] Date of Service: 10/26/2016 8:00 AM Medical Record Patient Account Number: [id number] Number: Treating RN: [name] July 30, 1961 (55 y.o. Other Clinician: Date of Birth/Sex: Female) Treating [name] Primary Care Physician: [name] Physician/Extender: G Referring Physician: Sallee [name] in Treatment: 5 Clinic Level of Care Assessment Items TOOL 4 Quantity Score X - Use when only an EandM is performed on FOLLOW-UP visit 1  0 ASSESSMENTS - Nursing Assessment / Reassessment X - Reassessment of Co-morbidities (includes updates in patient status) 1 10 X - Reassessment of Adherence to Treatment Plan 1 5 ASSESSMENTS - Wound and Skin Assessment / Reassessment []  - Simple Wound Assessment / Reassessment - one wound 0 X - Complex Wound Assessment / Reassessment - multiple wounds 3 5 []  - Dermatologic / Skin Assessment (not related to wound area) 0 ASSESSMENTS - Focused Assessment []  - Circumferential Edema Measurements - multi extremities 0 []  - Nutritional Assessment / Counseling / Intervention 0 []  - Lower Extremity Assessment (monofilament, tuning fork, pulses) 0 []  - Peripheral Arterial Disease Assessment (using hand held doppler) 0 ASSESSMENTS - Ostomy and/or Continence Assessment and Care []  - Incontinence Assessment and Management 0 []  - Ostomy Care Assessment and Management (repouching, etc.) 0 PROCESS - Coordination of Care []  - Simple Patient / Family Education for ongoing care 0 X - Complex (extensive) Patient / Family Education for ongoing care 1 20 X - Staff obtains Programmer, systems, Records, Test Results / Process Orders 1 10 X - Staff telephones HHA, Nursing Homes / Clarify orders / etc 1 10 [name], [name] ([id number]) []  - Routine Transfer to another Facility (non-emergent condition) 0 []  - Routine Hospital Admission (non-emergent condition) 0 []  - New Admissions / Biomedical engineer / Ordering NPWT, Apligraf, etc. 0 []  - Emergency Hospital Admission (emergent condition) 0 X - Simple Discharge Coordination 1 10 []  - Complex (extensive) Discharge Coordination 0 PROCESS - Special Needs []  - Pediatric / Minor Patient Management 0 []  - Isolation Patient Management 0 []  - Hearing / Language / Visual special needs 0 []  - Assessment of Community assistance (transportation, D/C planning, etc.) 0 []  - Additional assistance / Altered mentation 0 []  - Support Surface(s) Assessment (bed, cushion, seat, etc.)  0 INTERVENTIONS - Wound Cleansing / Measurement []  - Simple Wound Cleansing - one wound 0 X - Complex Wound Cleansing - multiple wounds 3 5 X -  Wound Imaging (photographs - any number of wounds) 1 5 []  - Wound Tracing (instead of photographs) 0 X - Simple Wound Measurement - one wound 1 5 X - Complex Wound Measurement - multiple wounds 3 5 INTERVENTIONS - Wound Dressings X - Small Wound Dressing one or multiple wounds 1 10 X - Medium Wound Dressing one or multiple wounds 2 15 []  - Large Wound Dressing one or multiple wounds 0 X - Application of Medications - topical 1 5 []  - Application of Medications - injection 0 [name], [name] ([id number]) INTERVENTIONS - Miscellaneous []  - External ear exam 0 []  - Specimen Collection (cultures, biopsies, blood, body fluids, etc.) 0 []  - Specimen(s) / Culture(s) sent or taken to Lab for analysis 0 []  - Patient Transfer (multiple staff / [name] / Similar devices) 0 []  - Simple Staple / Suture removal (25 or less) 0 []  - Complex Staple / Suture removal (26 or more) 0 []  - Hypo / Hyperglycemic Management (close monitor of Blood Glucose) 0 []  - Ankle / Brachial Index (ABI) - do not check if billed separately 0 X - Vital Signs 1 5 Has the patient been seen at the hospital within the last three years: Yes Total Score: 170 Level Of Care: New/Established - Level 5 Electronic Signature(s) Signed: 10/26/2016 5:08:45 PM By: Alric Quan Entered By: Alric Quan on 10/26/2016 15:42:27 [name] -------------------------------------------------------------------------------- Encounter Discharge Information Details Patient Name: [name] Date of Service: 10/26/2016 8:00 AM Medical Record Patient Account Number: [id number] Number: Treating RN: [name] 1961/06/22 (55 y.o. Other Clinician: Date of Birth/Sex: Female) Treating [name] Primary Care Physician: [name] Physician/Extender:  G Referring Physician: Sallee [name] in Treatment: 5 Encounter Discharge Information Items Discharge Pain Level: 0 Discharge Condition: Stable Ambulatory Status: Wheelchair Discharge Destination: Home Transportation: Other Accompanied By: self Schedule Follow-up Appointment: Yes Medication Reconciliation completed and provided to Patient/Care Yes Tasia Liz: Provided on Clinical Summary of Care: 10/26/2016 Form Type Recipient Paper Patient TW Electronic Signature(s) Signed: 10/26/2016 9:14:16 AM By: Ruthine Dose Entered By: Ruthine Dose on 10/26/2016 09:14:16 Troutman, [name] ([id number]) -------------------------------------------------------------------------------- Lower Extremity Assessment Details Patient Name: [name] Date of Service: 10/26/2016 8:00 AM Medical Record Patient Account Number: [id number] Number: Treating RN: [name] 07-25-1961 (55 y.o. Other Clinician: Date of Birth/Sex: Female) Treating [name] Primary Care Physician: [name] Physician/Extender: G Referring Physician: Sallee [name] in Treatment: 5 Electronic Signature(s) Signed: 10/26/2016 5:08:45 PM By: Alric Quan Entered By: Alric Quan on 10/26/2016 08:08:36 Eisinger, [place] ([id number]) -------------------------------------------------------------------------------- Multi Wound Chart Details Patient Name: [name] Date of Service: 10/26/2016 8:00 AM Medical Record Patient Account Number: [id number] Number: Treating RN: [name] 1961-01-26 (55 y.o. Other Clinician: Date of Birth/Sex: Female) Treating [name] Primary Care Physician: [name] Physician/Extender: G Referring Physician: Sallee [name] in Treatment: 5 Vital Signs Height(in): 63 Pulse(bpm): 77 Weight(lbs): 257 Blood Pressure 139/62 (mmHg): Body Mass Index(BMI): 46 Temperature(F): 98.0 Respiratory Rate 18 (breaths/min): Photos:  [3:No Photos] [4:No Photos] [5:No Photos] Wound Location: [3:Right Gluteal fold] [4:Left Gluteal fold] [5:Left Knee - Posterior] Wounding Event: [3:Pressure Injury] [4:Pressure Injury] [5:Gradually Appeared] Primary Etiology: [3:Pressure Ulcer] [4:Pressure Ulcer] [5:Diabetic Wound/Ulcer of the Lower Extremity] Comorbid History: [3:Asthma, Hypertension, Type II Diabetes, Neuropathy] [4:Asthma, Hypertension, Type II Diabetes, Neuropathy] [5:Asthma, Hypertension, Type II Diabetes, Neuropathy] Date Acquired: [3:04/21/2016] [4:04/21/2016] [5:07/15/2016] Weeks of Treatment: [3:5] [4:5] [5:5] Wound Status: [3:Open] [4:Open] [5:Open] Measurements L x W x D 1.5x1x6.1 [4:0.7x1x0.1] [  5:0.3x0.3x0.5] (cm) Area (cm) : [3:1.178] [4:0.55] [5:0.071] Volume (cm) : [3:7.186] [4:0.055] [5:0.035] % Reduction in Area: [3:61.60%] [4:92.90%] [5:85.70%] % Reduction in Volume: 58.20% [4:99.60%] [5:99.50%] Position 1 (o'clock): [5:12] Maximum Distance 1 [5:15] (cm): Tunneling: [3:No] [4:No] [5:Yes] Classification: [3:Category/Stage IV] [4:Category/Stage IV] [5:Grade 2] Exudate Amount: [3:Large] [4:Large] [5:Large] Exudate Type: [3:Purulent] [4:Serous] [5:Purulent] Exudate Color: [3:yellow, brown, green] [4:amber] [5:yellow, brown, green] Foul Odor After [3:Yes] [4:Yes] [5:Yes] Cleansing: [3:No] [4:No] [5:No] Odor Anticipated Due to Product Use: Wound Margin: Distinct, outline attached Distinct, outline attached Flat and Intact Granulation Amount: Medium (34-66%) Large (67-100%) Medium (34-66%) Granulation Quality: Pink, Pale Red, Pink Pink Necrotic Amount: Medium (34-66%) Small (1-33%) Medium (34-66%) Exposed Structures: Fascia: Yes Fat: Yes Fat: Yes Fat: Yes Muscle: Yes Muscle: Yes Muscle: Yes Fascia: No Fascia: No Tendon: No Tendon: No Tendon: No Joint: No Joint: No Bone: No Bone: No Epithelialization: None None None Periwound Skin Texture: No Abnormalities Noted No Abnormalities  Noted Edema: Yes Excoriation: No Induration: No Callus: No Crepitus: No Fluctuance: No Friable: No Rash: No Scarring: No Periwound Skin Maceration: Yes Maceration: Yes Maceration: Yes Moisture: Moist: Yes Moist: Yes Moist: Yes Dry/Scaly: No Periwound Skin Color: No Abnormalities Noted No Abnormalities Noted Erythema: Yes Rubor: Yes Atrophie Blanche: No Cyanosis: No Ecchymosis: No Hemosiderin Staining: No Mottled: No Pallor: No Erythema Location: N/A N/A Circumferential Temperature: No Abnormality No Abnormality Hot Tenderness on Yes Yes Yes Palpation: Wound Preparation: Ulcer Cleansing: Ulcer Cleansing: Ulcer Cleansing: Rinsed/Irrigated with Rinsed/Irrigated with Rinsed/Irrigated with Saline Saline Saline Topical Anesthetic Topical Anesthetic Topical Anesthetic Applied: Other: lidocaine Applied: Other: lidocaine Applied: Other: lidocaine 4% 4% 4% Treatment Notes Electronic Signature(s) Signed: 10/26/2016 5:08:45 PM By: Vivi Ferns, Jashae Carlean [name] ([id number]) Entered By: Alric Quan on 10/26/2016 08:31:09 Follansbee, [name] ([id number]) -------------------------------------------------------------------------------- Multi-Disciplinary Care Plan Details Patient Name: [name] Date of Service: 10/26/2016 8:00 AM Medical Record Patient Account Number: [id number] Number: Treating RN: [name] [birth year] (55 y.o. Other Clinician: Date of Birth/Sex: Female) Treating ROBSON, [place] Primary Care Physician: [name] Physician/Extender: G Referring Physician: Sallee [name] in Treatment: 5 Active Inactive Abuse / Safety / Falls / Self Care Management Nursing Diagnoses: Potential for falls Goals: Patient will remain injury free Date Initiated: 09/21/2016 Goal Status: Active Interventions: Assess fall risk on admission and as needed Notes: Nutrition Nursing Diagnoses: Imbalanced nutrition Potential for alteratiion in  Nutrition/Potential for imbalanced nutrition Goals: Patient/caregiver agrees to and verbalizes understanding of need to use nutritional supplements and/or vitamins as prescribed Date Initiated: 09/21/2016 Goal Status: Active Patient/caregiver verbalizes understanding of need to maintain therapeutic glucose control per primary care physician Date Initiated: 09/21/2016 Goal Status: Active Interventions: Assess patient nutrition upon admission and as needed per policy Notes: Gencarelli, Jessenia L. ([id number]) Orientation to the Wound Care Program Nursing Diagnoses: Knowledge deficit related to the wound healing center program Goals: Patient/caregiver will verbalize understanding of the [place] Date Initiated: 09/21/2016 Goal Status: Active Interventions: Provide education on orientation to the wound center Notes: Pain, Acute or Chronic Nursing Diagnoses: Pain, acute or chronic: actual or potential Potential alteration in comfort, pain Goals: Patient will verbalize adequate pain control and receive pain control interventions during procedures as needed Date Initiated: 09/21/2016 Goal Status: Active Patient/caregiver will verbalize adequate pain control between visits Date Initiated: 09/21/2016 Goal Status: Active Patient/caregiver will verbalize comfort level met Date Initiated: 09/21/2016 Goal Status: Active Interventions: Assess comfort goal upon admission Complete pain assessment as per visit requirements Notes: Wound/Skin Impairment Nursing Diagnoses: Impaired  tissue integrity Goals: Ulcer/skin breakdown will have a volume reduction of 30% by week 4 Date Initiated: 09/21/2016 NIKYAH, LACKMAN ([id number]) Goal Status: Active Ulcer/skin breakdown will have a volume reduction of 50% by week 8 Date Initiated: 09/21/2016 Goal Status: Active Ulcer/skin breakdown will have a volume reduction of 80% by week 12 Date Initiated: 09/21/2016 Goal Status:  Active Interventions: Assess ulceration(s) every visit Notes: Electronic Signature(s) Signed: 10/26/2016 5:08:45 PM By: Alric Quan Entered By: Alric Quan on 10/26/2016 08:31:04 Gresham, Kynli Carlean [name] ([id number]) -------------------------------------------------------------------------------- Pain Assessment Details Patient Name: [name] Date of Service: 10/26/2016 8:00 AM Medical Record Patient Account Number: [id number] Number: Treating RN: [name] 1961-03-05 (55 y.o. Other Clinician: Date of Birth/Sex: Female) Treating [name] Primary Care Physician: [name] Physician/Extender: G Referring Physician: Sallee [name] in Treatment: 5 Active Problems Location of Pain Severity and Description of Pain Patient Has Paino No Site Locations With Dressing Change: No Pain Management and Medication Current Pain Management: Electronic Signature(s) Signed: 10/26/2016 5:08:45 PM By: Alric Quan Entered By: Alric Quan on 10/26/2016 08:06:04 Heinz, [name] ([id number]) -------------------------------------------------------------------------------- Patient/Caregiver Education Details Patient Name: [name] Date of Service: 10/26/2016 8:00 AM Medical Record Patient Account Number: [id number] Number: Treating RN: [name] [birth year] (55 y.o. Other Clinician: Date of Birth/Gender: Female) Treating [name] Primary Care Physician: [name] Physician/Extender: G Referring Physician: Sallee [name] in Treatment: 5 Education Assessment Education Provided To: Patient Education Topics Provided Wound/Skin Impairment: Handouts: Other: change dressing as ordered Methods: Demonstration, Explain/Verbal Responses: State content correctly Electronic Signature(s) Signed: 10/26/2016 5:08:45 PM By: Alric Quan Entered By: Alric Quan on 10/26/2016 08:41:05 Boutelle, Roniqua L.  ([id number]) -------------------------------------------------------------------------------- Wound Assessment Details Patient Name: [name] Date of Service: 10/26/2016 8:00 AM Medical Record Patient Account Number: [id number] Number: Treating RN: [name] [birth year] (55 y.o. Other Clinician: Date of Birth/Sex: Female) Treating ROBSON, [place] Primary Care Physician: [name] Physician/Extender: G Referring Physician: Sallee [name] in Treatment: 5 Wound Status Wound Number: 3 Primary Pressure Ulcer Etiology: Wound Location: Right Gluteal fold Wound Status: Open Wounding Event: Pressure Injury Comorbid Asthma, Hypertension, Type II Date Acquired: 04/21/2016 History: Diabetes, Neuropathy Weeks Of Treatment: 5 Clustered Wound: No Photos Photo Uploaded By: Alric Quan on 10/26/2016 09:23:06 Wound Measurements Length: (cm) 1.5 Width: (cm) 1 Depth: (cm) 6.1 Area: (cm) 1.178 Volume: (cm) 7.186 % Reduction in Area: 61.6% % Reduction in Volume: 58.2% Epithelialization: None Tunneling: No Undermining: No Wound Description Classification: Category/Stage IV Wound Margin: Distinct, outline attached Exudate Amount: Large Exudate Type: Purulent Exudate Color: yellow, brown, green Foul Odor After Cleansing: Yes Due to Product Use: No Wound Bed Granulation Amount: Medium (34-66%) Exposed Structure Granulation Quality: Pink, Pale Fascia Exposed: Yes Necrotic Amount: Medium (34-66%) Fat Layer Exposed: Yes Whitmill, Bryn L. ([id number]) Necrotic Quality: Adherent Slough Tendon Exposed: No Muscle Exposed: Yes Necrosis of Muscle: No Joint Exposed: No Bone Exposed: No Periwound Skin Texture Texture Color No Abnormalities Noted: No No Abnormalities Noted: No Moisture Temperature / Pain No Abnormalities Noted: No Temperature: No Abnormality Maceration: Yes Tenderness on Palpation: Yes Moist: Yes Wound Preparation Ulcer Cleansing:  Rinsed/Irrigated with Saline Topical Anesthetic Applied: Other: lidocaine 4%, Treatment Notes Wound #3 (Right Gluteal fold) 1. Cleansed with: Clean wound with Normal Saline Cleanse wound with antibacterial soap and water 2. Anesthetic Topical Lidocaine 4% cream to wound bed prior to debridement 3. Peri-wound Care: Skin Prep 5. Secondary Dressing Applied Bordered Foam Dressing Dry Gauze Notes silver alginate Electronic Signature(s)  Signed: 10/26/2016 5:08:45 PM By: Alric Quan Entered By: Alric Quan on 10/26/2016 08:19:35 [place], [place] ([id number]) -------------------------------------------------------------------------------- Wound Assessment Details Patient Name: [name] Date of Service: 10/26/2016 8:00 AM Medical Record Patient Account Number: [id number] Number: Treating RN: [name] [birth year] (55 y.o. Other Clinician: Date of Birth/Sex: Female) Treating ROBSON, [place] Primary Care Physician: [name] Physician/Extender: G Referring Physician: Sallee [name] in Treatment: 5 Wound Status Wound Number: 4 Primary Pressure Ulcer Etiology: Wound Location: Left Gluteal fold Wound Status: Open Wounding Event: Pressure Injury Comorbid Asthma, Hypertension, Type II Date Acquired: 04/21/2016 History: Diabetes, Neuropathy Weeks Of Treatment: 5 Clustered Wound: No Photos Photo Uploaded By: Alric Quan on 10/26/2016 09:23:06 Wound Measurements Length: (cm) 0.7 Width: (cm) 1 Depth: (cm) 0.1 Area: (cm) 0.55 Volume: (cm) 0.055 % Reduction in Area: 92.9% % Reduction in Volume: 99.6% Epithelialization: None Tunneling: No Undermining: No Wound Description Classification: Category/Stage IV Wound Margin: Distinct, outline attached Exudate Amount: Large Exudate Type: Serous Exudate Color: amber Foul Odor After Cleansing: Yes Due to Product Use: No Wound Bed Granulation Amount: Large (67-100%) Exposed  Structure Granulation Quality: Red, Pink Fascia Exposed: No Necrotic Amount: Small (1-33%) Fat Layer Exposed: Yes Miralles, Adaisha L. ([id number]) Necrotic Quality: Adherent Slough Tendon Exposed: No Muscle Exposed: Yes Necrosis of Muscle: No Periwound Skin Texture Texture Color No Abnormalities Noted: No No Abnormalities Noted: No Moisture Temperature / Pain No Abnormalities Noted: No Temperature: No Abnormality Maceration: Yes Tenderness on Palpation: Yes Moist: Yes Wound Preparation Ulcer Cleansing: Rinsed/Irrigated with Saline Topical Anesthetic Applied: Other: lidocaine 4%, Treatment Notes Wound #4 (Left Gluteal fold) 1. Cleansed with: Clean wound with Normal Saline Cleanse wound with antibacterial soap and water 2. Anesthetic Topical Lidocaine 4% cream to wound bed prior to debridement 3. Peri-wound Care: Skin Prep 5. Secondary Dressing Applied Bordered Foam Dressing Dry Gauze Notes silver alginate Electronic Signature(s) Signed: 10/26/2016 5:08:45 PM By: Alric Quan Entered By: Alric Quan on 10/26/2016 08:18:55 Pollina, Helene Carlean [name] ([id number]) -------------------------------------------------------------------------------- Wound Assessment Details Patient Name: [name] Date of Service: 10/26/2016 8:00 AM Medical Record Patient Account Number: [id number] Number: Treating RN: [name] [birth year] (55 y.o. Other Clinician: Date of Birth/Sex: Female) Treating ROBSON, [place] Primary Care Physician: [name] Physician/Extender: G Referring Physician: Sallee [name] in Treatment: 5 Wound Status Wound Number: 5 Primary Diabetic Wound/Ulcer of the Lower Etiology: Extremity Wound Location: Left Knee - Posterior Wound Status: Open Wounding Event: Gradually Appeared Comorbid Asthma, Hypertension, Type II Date Acquired: 07/15/2016 History: Diabetes, Neuropathy Weeks Of Treatment: 5 Clustered Wound: No Photos Photo Uploaded By:  Alric Quan on 10/26/2016 09:23:22 Wound Measurements Length: (cm) 0.3 Width: (cm) 0.3 Depth: (cm) 0.5 Area: (cm) 0.071 Volume: (cm) 0.035 % Reduction in Area: 85.7% % Reduction in Volume: 99.5% Epithelialization: None Tunneling: Yes Position (o'clock): 12 Maximum Distance: (cm) 15 Undermining: No Wound Description Classification: Grade 2 Foul Odor After Wound Margin: Flat and Intact Due to Product Exudate Amount: Large Exudate Type: Purulent Exudate Color: yellow, brown, green Cleansing: Yes Use: No Wound Bed Saavedra, Briel L. ([id number]) Granulation Amount: Medium (34-66%) Exposed Structure Granulation Quality: Pink Fascia Exposed: No Necrotic Amount: Medium (34-66%) Fat Layer Exposed: Yes Necrotic Quality: Adherent Slough Tendon Exposed: No Muscle Exposed: Yes Necrosis of Muscle: No Joint Exposed: No Bone Exposed: No Periwound Skin Texture Texture Color No Abnormalities Noted: No No Abnormalities Noted: No Callus: No Atrophie Blanche: No Crepitus: No Cyanosis: No Excoriation: No Ecchymosis: No Fluctuance: No Erythema: Yes Friable: No Erythema Location: Circumferential  Induration: No Hemosiderin Staining: No Localized Edema: Yes Mottled: No Rash: No Pallor: No Scarring: No Rubor: Yes Moisture Temperature / Pain No Abnormalities Noted: No Temperature: Hot Dry / Scaly: No Tenderness on Palpation: Yes Maceration: Yes Moist: Yes Wound Preparation Ulcer Cleansing: Rinsed/Irrigated with Saline Topical Anesthetic Applied: Other: lidocaine 4%, Treatment Notes Wound #5 (Left, Posterior Knee) 1. Cleansed with: Clean wound with Normal Saline 2. Anesthetic Topical Lidocaine 4% cream to wound bed prior to debridement 3. Peri-wound Care: Skin Prep 4. Dressing Applied: Iodoform packing Gauze 5. Secondary Dressing Applied Bordered Foam Dressing Dry Gauze Electronic Signature(s) Signed: 10/26/2016 5:08:45 PM By: Vivi Ferns, [place]  ([id number]) Entered By: Alric Quan on 10/26/2016 08:20:13 Kiehn, [name] ([id number]) -------------------------------------------------------------------------------- [place] Details Patient Name: [name] Date of Service: 10/26/2016 8:00 AM Medical Record Patient Account Number: [id number] Number: Treating RN: [name] 02/11/1961 (55 y.o. Other Clinician: Date of Birth/Sex: Female) Treating [name] Primary Care Physician: [name] Physician/Extender: G Referring Physician: Sallee [name] in Treatment: 5 Vital Signs Time Taken: 08:06 Temperature (F): 98.0 Height (in): 63 Pulse (bpm): 77 Weight (lbs): 257 Respiratory Rate (breaths/min): 18 Body Mass Index (BMI): 45.5 Blood Pressure (mmHg): 139/62 Reference Range: 80 - 120 mg / dl Electronic Signature(s) Signed: 10/26/2016 5:08:45 PM By: Alric Quan Entered By: Alric Quan on 10/26/2016 09:92:78

## 2016-10-27 NOTE — Progress Notes (Signed)
[name] ([id number]) Visit Report for 10/26/2016 Chief Complaint Document Details Patient Name: [name] Date of Service: 10/26/2016 8:00 AM Medical Record Patient Account Number: [id number] Number: Treating RN: [name] October 24, 1961 (55 y.o. Other Clinician: Date of Birth/Sex: Female) Treating [name] Primary Care Physician/Extender: [name] Physician: Referring Physician: [name] in Treatment: 5 Information Obtained from: Patient Chief Complaint Wounds over the right ischial area left gluteal area and on the lateral aspect of the left popliteal fossa Electronic Signature(s) Signed: 10/26/2016 5:04:57 PM By: Katelyn Najjar MD Entered By: [name] on 10/26/2016 09:25:04 [name], [name] -------------------------------------------------------------------------------- HPI Details Patient Name: [name] Date of Service: 10/26/2016 8:00 AM Medical Record Patient Account Number: [id number] Number: Treating RN: [name] Jan 07, 1961 (55 y.o. Other Clinician: Date of Birth/Sex: Female) Treating [name] Primary Care Physician/Extender: [name] Physician: Referring Physician: [name] in Treatment: 5 History of Present Illness Location: bilateral gluteal ulcerations and left popliteal ulceration with tunneling Quality: Patient reports experiencing a dull pain to affected area(s). Severity: Patient states wound are getting worse. Duration: Patient has had the wound for < 2 weeks prior to presenting for treatment Timing: The pain is intermittent in severity as far as how intense it becomes but is present all the time. Manipulationn makes this worse. Context: The wound occurred when the patient had a fall and was unconscious for about 48 hours laying on the floor and she had pressure injury at that stage. Modifying Factors: Other treatment(s) tried include:as noted below  she has been seen by visiting wound care physicians or nurse practitioners and details have been noted Associated Signs and Symptoms: Patient reports having difficulty standing for long periods. HPI Description: 55 year old patient who was seen by visiting [name] wound care specialist for a wound on both her buttock and was found to have an unstageable wound on the right buttock for about 2 months. I understand that she had a fall and was laying on the floor for about 48 hours before she was found and taken to the ICU and had a long injury to her gluteal area from pressure and also had broken her right humerus. She has had a right proximal humerus fracture and has been followed up with orthopedics recently. The patient has a past medical history of type 2 diabetes mellitus, paraparesis, acute pyelonephritis, GERD, hypertension, glaucoma, chronic pain, anxiety neurosis, nicotine dependence, COPD. the patient had some debridement done and was to operative was recommended to use Silvadene dressing and offloading. She is a smoker and occasionally smokes a few cigarettes. the patient requested a second opinion for months and is here to discuss her care. 09/21/16; the patient re-presents from home today for review of 3 different wounds. I note that she was seen in the clinic here in July at which time she had bilateral buttock wounds. It was apparently suggested at that time that she use a wound VAC bridged to both wounds just near the initial tuberosity's bilaterally which she refused. The history was a bit difficult to put together. Apparently this patient became ill at the end of April of this year. She was found sitting on the floor she had apparently been for 2 days and subsequently admitted to hospital from 04/23/16 through 05/02/16 and at that point she was critically ill ultimately having sepsis secondary to UTI, nontraumatic rhabdomyolysis and diabetic ketoacidosis. She had acute renal failure and  I think required ICU care including intubation. Patient  states her wounds actually started at that point on the bilateral issue tuberosities however in reviewing the discharge summary from 5/8 I see no reference to wounds at that point. It did state that she had left lower extremity cellulitis however. Reviewing Epic I see no relevant x-rays. It would appear that her discharge creatinine was within the normal range and indeed on [place], [name] ([id number]) 9/15 her creatinine has remained normal. [facility] There the wounds on her bilateral Botox were dressed. Only just before her discharge from the nursing facility she developed an "knot" which was interpreted as cellulitis on the posterior aspect of her left knee she was given antibiotics. Apparently sometime late in July a this actually opened and became a wound at home health care was tending to however she is still having purulent drainage coming from this and by my understanding the wound depth is actually become unmeasurable. I am not really clear about what home health has been placing in any of these wound areas. The patient states that is something with silver and it. She is not been systemically unwell no fever or chills her appetite is good. She is a diabetic poorly controlled however she states that her recent blood sugars at home have been in the low to mid 100s. 09/28/16 On evaluation today patient appears to continue to exhibit the 3 areas of ulceration that were noted previous. She did have an x-ray of the right pelvis which showed evidence of potential soft tissue infection but no obvious osteomyelitis. There was a discussion last office visit concerning the possibility of a wound VAC. Witth that being said the x-ray report suggested that an MRI may be more appropriate to further evaluate the extent. Subsequently in regard to the wound over the popliteal portion of the  left lower extremity with tunneling at 12:00 the CT scan that was ordered was denied by insurance as they state the patient has not had x-rays prior to advanced imaging. Patient states that she is frustrated with the situation overall. 10/05/16 in the interval since I last saw this patient last week she has had the x-ray of the knee performed. I did review that x-ray today and fortunately shows no evidence of osteomyelitis or other acute abnormality at this point in time. She continues to have the opening iin the posterior oral popliteal space with tracking proximal up the posterior thigh. Nothing seems to have worsened but it also seems to have not improved. The same is true in regard to the right pressure ulcer over the gluteal region which extends toward the ischium. The left gluteal pressure ulcer actually appears to be doing somewhat better my opinion there is some necrotic slough but overall this appears fairly well. She tells me thatt she has some discomfort especially when home health is helping her with dressing changes as they do not know her. At worse she rates her pain to be a 5 out of 10 right now it's more like a 1 out of 10. 10/07/16; still the patient has 3 different wound areas. She has a deep stage IV wound over her right ischial tuberosity. She is due to have an MRI next week. The wound over her left ischial tuberosity is more superficial and underwent debridement today. Finally she has a small open area in her left popliteal fossa the probes on measurably forward superiorly. Still a lot of drainage coming out of this. The CT scan that I ordered  3 weeks ago was questioned by her insurance company wanting a plain x-ray first. As I understand things result of this is nothing has been done in 3 weeks in terms of imaging the thigh and she has an MRI booked of this along with her pelvis for next week line 10/12/16; patient has a deep probing wound over the right ischiall tuberosity,  stage III wound over the left visual tuberosity and a draining sinus in her left popliteal fossa. None of this much different from when I saw this 3 weeks ago. We have been using silver rope to the right ischial wound and a draining area in the left popliteal fossa. Plain silver alginate to the area on the left ischial tuberosity 10/19/16; the patient's wounds are essentially unchanged although the area on the left lower gluteal is actually improved. Our intake nurse noted drainage from the right initial tuberosity probing wound as well as the draining area in the left popliteal fossa. Both of these were cultured. She had x-rays I think at the insistence of her insurance company on 09/23/16 x-ray of the pelvis was not particularly helpful she did have soft tissue air over the right lower pelvis although with the depth of this wound this is not surprising. An x-ray of her left knee did not show any specific abnormalities. We are still using silver alginate to these wound areas. Her MRI is booked for 10/27 10/26/16; cultures of the purulent drainage in her right initial tuberosity wound grew moderate Proteus and few staph aureus. The same organisms were cultured out of the left knee sinus tract posteriorly. The staph aureus is MRSA. I had started her on Augmentin last week I added doxycycline. The MRI of the left lower [name], [name] ([id number]) extremity and pelvis was finally done. The MRI of the femur showed subcutaneous soft tissue swelling edema fluid and myositis in the vastus lateralis muscle but no soft tissue abscess septic arthritis or osteomyelitis. MRI of the pelvis showed the left wound to be more expensive extending down to the bone there was osteomyelitis. Left hamstring tendons were also involved. No septic arthritis involving the hip. The decubitus ulcer on the right side showed no definite osteomyelitis or abscess.. The right hip wound is actually the one the probes 6 cm downward.  But the MRI showing infection including osteomyelitis on the left explains the draining sinus in the popliteal fossa on the left. She did have antibiotics in the hospitalization last time and this extended into her nursing home stay but I'm not exactly sure what antibiotics and for what duration. According the patient this did include vancomycin with considerable effort of our staff we are able to get the patient into see Dr. Sampson Goon today. There were transportation difficulties. Her mother had open heart surgery and is in the ICU in [place] therefore her brother was unable to transport. Dr. Jarrett Ables office graciously arranged time to see her today. From my point of view she is going to require IV vancomycin plus perhaps a third generation cephalosporin. I plan to keep her on doxycycline and Augmentin until the IV antibiotics can be arranged. Electronic Signature(s) Signed: 10/26/2016 5:04:57 PM By: Katelyn Najjar MD Entered By: [name] on 10/26/2016 09:30:04 Hauge, [name] -------------------------------------------------------------------------------- Physical Exam Details Patient Name: [name] Date of Service: 10/26/2016 8:00 AM Medical Record Patient Account Number: [id number] Number: Treating RN: [name] January 28, 1961 (55 y.o. Other Clinician: Date of Birth/Sex: Female) Treating Tylerjames Hoglund Primary Care Physician/Extender: Reece Agar  BLISS, [name] Physician: Referring Physician: [name] in Treatment: 5 Constitutional Sitting or standing Blood Pressure is within target range for patient.. Pulse regular and within target range for patient.Marland Kitchen Respirations regular, non-labored and within target range.. Temperature is normal and within the target range for the patient.. Patient's appearance is neat and clean. Appears in no acute distress. Well nourished and well developed.. Eyes Conjunctivae clear. No  discharge.Marland Kitchen Respiratory Respiratory effort is easy and symmetric bilaterally. Rate is normal at rest and on room air.. Bilateral breath sounds are clear and equal in all lobes with no wheezes, rales or rhonchi.. Cardiovascular Heart rhythm and rate regular, without murmur or gallop.. Gastrointestinal (GI) Abdomen is soft and non-distended without masses or tenderness. Bowel sounds active in all quadrants.. Notes Wound exam; wounds are not much different from last week. Probing area over the right initial tuberosity goes down 6 cm. We have been using silver alginate packing on this, I was concerned there might be underlying osteomyelitis. Now that that issue has been resolved I wonder about attempting to wound VAC this oThe MRI showing considerable underlying osteomyelitis in the left ishial tuberosity and also myositis in the vastus lateralis lateralis muscle on the left explains the open area in the popliteal fossa at least by my review of this. She is going to require IV antibiotics. The wound over the left visual tuberosity however continues to improve Electronic Signature(s) Signed: 10/26/2016 5:04:57 PM By: Katelyn Najjar MD Entered By: [name] on 10/26/2016 09:32:40 [name], [name] ([id number]) -------------------------------------------------------------------------------- Physician Orders Details Patient Name: [name] Date of Service: 10/26/2016 8:00 AM Medical Record Patient Account Number: [id number] Number: Treating RN: [name] 04-Jan-1961 (55 y.o. Other Clinician: Date of Birth/Sex: Female) Treating Kasidy Gianino Primary Care Physician/Extender: [name] Physician: Referring Physician: [name] in Treatment: 5 Verbal / Phone Orders: Yes Clinician: Pinkerton, Debi Read Back and Verified: Yes Diagnosis Coding Wound Cleansing Wound #3 Right Gluteal fold o Clean wound with Normal Saline. o Cleanse wound with mild  soap and water Wound #4 Left Gluteal fold o Clean wound with Normal Saline. o Cleanse wound with mild soap and water Wound #5 Left,Posterior Knee o Clean wound with Normal Saline. o Cleanse wound with mild soap and water Anesthetic Wound #3 Right Gluteal fold o Topical Lidocaine 4% cream applied to wound bed prior to debridement - clinic use only Wound #4 Left Gluteal fold o Topical Lidocaine 4% cream applied to wound bed prior to debridement - clinic use only Wound #5 Left,Posterior Knee o Topical Lidocaine 4% cream applied to wound bed prior to debridement - clinic use only Skin Barriers/Peri-Wound Care Wound #3 Right Gluteal fold o Skin Prep o Antifungal cream - ketoconazole to around the wound areas Wound #4 Left Gluteal fold o Skin Prep o Antifungal cream - ketoconazole to around the wound areas Wound #5 Left,Posterior Knee o Skin Prep [place], Takyra L. ([id number]) o Antifungal cream - ketoconazole to around the wound areas Primary Wound Dressing Wound #3 Right Gluteal fold o Aquacel Ag - or equivalent to Silver Alginate Rope pack lightly into wound and leave a tail where it can be pulled out and to cover the outside of the wound. o Sorbalgon Ag - used in clinic Wound #4 Left Gluteal fold o Aquacel Ag - Silver Alginate o Sorbalgon Ag - used in clinic Wound #5 Left,Posterior Knee o Iodoform packing Gauze - Pack lightly into wound and leave a tail where it can be pulled  out and to cover the outside of the wound. Secondary Dressing Wound #3 Right Gluteal fold o Dry Gauze o Non-adherent pad - Telfa island, tape, or BFD Wound #4 Left Gluteal fold o Dry Gauze o Non-adherent pad - Telfa island, tape, or BFD Wound #5 Left,Posterior Knee o Dry Gauze o Non-adherent pad - Telfa island, tape, or BFD Dressing Change Frequency Wound #3 Right Gluteal fold o Change dressing every other day. Wound #4 Left Gluteal fold o Change  dressing every other day. Wound #5 Left,Posterior Knee o Change dressing every other day. Follow-up Appointments Wound #3 Right Gluteal fold o Return Appointment in 1 week. Wound #4 Left Gluteal fold o Return Appointment in 1 week. Wound #5 Left,Posterior Knee o Return Appointment in 1 week. INDIYA, DARITY ([id number]) Off-Loading Wound #3 Right Gluteal fold o Roho cushion for wheelchair - *********[facility] to order*********** o Turn and reposition every 2 hours - Museum/gallery curator *********[facility] to order*********** Wound #4 Left Gluteal fold o Roho cushion for wheelchair - *********[facility] to order*********** o Turn and reposition every 2 hours o Mattress - Museum/gallery curator *********[facility] to order*********** Wound #5 Left,Posterior Knee o Roho cushion for wheelchair - *********[facility] to order*********** o Turn and reposition every 2 hours o Mattress - Museum/gallery curator *********[facility] to order*********** Home Health Wound #3 Right Gluteal fold o Continue Home Health Visits - [facility]/ [facility] *****Please order ROHO cushion and air mattress****** o Home Health Nurse may visit PRN to address patientos wound care needs. o FACE TO FACE ENCOUNTER: MEDICARE and MEDICAID PATIENTS: I certify that this patient is under my care and that I had a face-to-face encounter that meets the physician face-to-face encounter requirements with this patient on this date. The encounter with the patient was in whole or in part for the following MEDICAL CONDITION: (primary reason for Home Healthcare) MEDICAL NECESSITY: I certify, that based on my findings, NURSING services are a medically necessary home health service. HOME BOUND STATUS: I certify that my clinical findings support that this patient is homebound (i.e., Due to illness or injury, pt requires aid of supportive devices such as crutches, cane, wheelchairs, walkers, the  use of special transportation or the assistance of another person to leave their place of residence. There is a normal inability to leave the home and doing so requires considerable and taxing effort. Other absences are for medical reasons / religious services and are infrequent or of short duration when for other reasons). o If current dressing causes regression in wound condition, may D/C ordered dressing product/s and apply Normal Saline Moist Dressing daily until next Wound Healing Center / Other MD appointment. Notify Wound Healing Center of regression in wound condition at [phone]. o Please direct any NON-WOUND related issues/requests for orders to patient's Primary Care Physician Wound #4 Left Gluteal fold o Continue [facility] Visits - [facility] *****Please order ROHO cushion and air mattress****** o Home Health Nurse may visit PRN to address patientos wound care needs. o FACE TO FACE ENCOUNTER: MEDICARE and MEDICAID PATIENTS: I certify that this patient is under my care and that I had a face-to-face encounter that meets the physician face-to-face encounter requirements with this patient on this date. The encounter with the patient was in [place], [place] L. ([id number]) whole or in part for the following MEDICAL CONDITION: (primary reason for Home Healthcare) MEDICAL NECESSITY: I certify, that based on my findings, NURSING services are a  medically necessary home health service. HOME BOUND STATUS: I certify that my clinical findings support that this patient is homebound (i.e., Due to illness or injury, pt requires aid of supportive devices such as crutches, cane, wheelchairs, walkers, the use of special transportation or the assistance of another person to leave their place of residence. There is a normal inability to leave the home and doing so requires considerable and taxing effort. Other absences are for medical reasons / religious services and are  infrequent or of short duration when for other reasons). o If current dressing causes regression in wound condition, may D/C ordered dressing product/s and apply Normal Saline Moist Dressing daily until next Wound Healing Center / Other MD appointment. Notify Wound Healing Center of regression in wound condition at [phone]. o Please direct any NON-WOUND related issues/requests for orders to patient's Primary Care Physician Wound #5 Left,Posterior Knee o Continue [facility] Health Visits - [facility] Nurse may visit PRN to address patientos wound care needs. o FACE TO FACE ENCOUNTER: MEDICARE and MEDICAID PATIENTS: I certify that this patient is under my care and that I had a face-to-face encounter that meets the physician face-to-face encounter requirements with this patient on this date. The encounter with the patient was in whole or in part for the following MEDICAL CONDITION: (primary reason for Home Healthcare) MEDICAL NECESSITY: I certify, that based on my findings, NURSING services are a medically necessary home health service. HOME BOUND STATUS: I certify that my clinical findings support that this patient is homebound (i.e., Due to illness or injury, pt requires aid of supportive devices such as crutches, cane, wheelchairs, walkers, the use of special transportation or the assistance of another person to leave their place of residence. There is a normal inability to leave the home and doing so requires considerable and taxing effort. Other absences are for medical reasons / religious services and are infrequent or of short duration when for other reasons). o If current dressing causes regression in wound condition, may D/C ordered dressing product/s and apply Normal Saline Moist Dressing daily until next Wound Healing Center / Other MD appointment. Notify Wound Healing Center of regression in wound condition at [phone]. o Please direct  any NON-WOUND related issues/requests for orders to patient's Primary Care Physician Medications-please add to medication list. Wound #3 Right Gluteal fold o Other: - Vitamin C, Zinc, Multivitamin ketoconazole Wound #4 Left Gluteal fold o Other: - Vitamin C, Zinc, Multivitamin ketoconazole Wound #5 Left,Posterior Knee o P.O. Antibiotics - Augmentin and Doxycycline o Other: - Vitamin C, Zinc, Multivitamin ketoconazole [name], [name] ([id number]) Consults o Infectious Disease - Dr. Sampson Goon Electronic Signature(s) Signed: 10/26/2016 5:04:57 PM By: Katelyn Najjar MD Signed: 10/26/2016 5:08:45 PM By: Alejandro Mulling Entered By: Alejandro Mulling on 10/26/2016 08:39:05 [name], [name] ([id number]) -------------------------------------------------------------------------------- Problem List Details Patient Name: [name] Date of Service: 10/26/2016 8:00 AM Medical Record Patient Account Number: [id number] Number: Treating RN: [name] 05-Dec-1961 (55 y.o. 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 Other Clinician: Date of Birth/Sex: Female) Treating Emmalyn Hinson Primary Care Physician/Extender: [name] Physician: Referring Physician: [name] in Treatment: 5 Subjective Chief Complaint Information obtained from Patient Wounds over the right ischial area left gluteal area and on the lateral aspect of the left popliteal fossa History of Present Illness (HPI) The following HPI elements were documented for the patient's wound: Location: bilateral gluteal ulcerations and left popliteal ulceration with tunneling Quality: Patient reports experiencing a dull pain to affected area(s). Severity: Patient states wound are getting worse. Duration: Patient has had the wound for < 2 weeks prior to presenting for treatment Timing: The pain is intermittent in severity as far as how intense it becomes but is present all the time. Manipulationn makes this worse. Context: The wound occurred when the patient had a fall and was unconscious for about 48 hours laying on the floor and she had pressure injury at that stage. Modifying Factors: Other treatment(s) tried include:as noted below she has been seen by visiting wound care physicians or nurse practitioners and details have been noted Associated Signs and Symptoms: Patient reports having difficulty standing for long periods. 55 year old patient who was seen by visiting [name] wound care specialist for a wound on both her buttock and was found to have an unstageable wound on the right buttock for about 2 months. I understand that she had a fall and was laying on the floor for about 48 hours before she was found and taken to the ICU and had a long injury to her gluteal area from pressure and also had broken her right humerus. She has had a right proximal humerus fracture and has been followed up with  orthopedics recently. The patient has a past medical history of type 2 diabetes mellitus, paraparesis, acute pyelonephritis, GERD, hypertension, glaucoma, chronic pain, anxiety neurosis, nicotine dependence, COPD. the patient had some debridement done and was to operative was recommended to use Silvadene dressing and offloading. She is a smoker and occasionally smokes a few cigarettes. the patient requested a second opinion for months and is here to discuss her care. 09/21/16; the patient re-presents from home today for review of 3 different wounds. I note that she was seen in the clinic here in July at which time she had bilateral buttock wounds. It was apparently suggested at that time that she use a wound VAC bridged to both wounds just near the initial tuberosity's bilaterally which she refused. [name], [name] ([id number]) The history was a bit difficult to put together. Apparently this patient became ill at the end of April of this year. She was found sitting on the floor she had apparently been for 2 days and subsequently admitted to hospital from 04/23/16 through 05/02/16 and at that point she was critically ill ultimately having sepsis secondary to UTI, nontraumatic rhabdomyolysis and diabetic ketoacidosis. She had acute renal failure and I think required ICU care including intubation. Patient states her wounds actually started at that point on the bilateral issue tuberosities however in reviewing the discharge summary from 5/8 I see no reference to wounds at that point. It did state that she had left lower extremity cellulitis however. Reviewing Epic I see no relevant x-rays. It would appear that her discharge  creatinine was within the normal range and indeed on 9/15 her creatinine has remained normal. [facility] There the wounds on her bilateral Botox were dressed. Only just before her discharge from the nursing facility she  developed an "knot" which was interpreted as cellulitis on the posterior aspect of her left knee she was given antibiotics. Apparently sometime late in July a this actually opened and became a wound at home health care was tending to however she is still having purulent drainage coming from this and by my understanding the wound depth is actually become unmeasurable. I am not really clear about what home health has been placing in any of these wound areas. The patient states that is something with silver and it. She is not been systemically unwell no fever or chills her appetite is good. She is a diabetic poorly controlled however she states that her recent blood sugars at home have been in the low to mid 100s. 09/28/16 On evaluation today patient appears to continue to exhibit the 3 areas of ulceration that were noted previous. She did have an x-ray of the right pelvis which showed evidence of potential soft tissue infection but no obvious osteomyelitis. There was a discussion last office visit concerning the possibility of a wound VAC. Witth that being said the x-ray report suggested that an MRI may be more appropriate to further evaluate the extent. Subsequently in regard to the wound over the popliteal portion of the left lower extremity with tunneling at 12:00 the CT scan that was ordered was denied by insurance as they state the patient has not had x-rays prior to advanced imaging. Patient states that she is frustrated with the situation overall. 10/05/16 in the interval since I last saw this patient last week she has had the x-ray of the knee performed. I did review that x-ray today and fortunately shows no evidence of osteomyelitis or other acute abnormality at this point in time. She continues to have the opening iin the posterior oral popliteal space with tracking proximal up the posterior thigh. Nothing seems to have worsened but it also seems to have not improved. The same is true in  regard to the right pressure ulcer over the gluteal region which extends toward the ischium. The left gluteal pressure ulcer actually appears to be doing somewhat better my opinion there is some necrotic slough but overall this appears fairly well. She tells me thatt she has some discomfort especially when home health is helping her with dressing changes as they do not know her. At worse she rates her pain to be a 5 out of 10 right now it's more like a 1 out of 10. 10/07/16; still the patient has 3 different wound areas. She has a deep stage IV wound over her right ischial tuberosity. She is due to have an MRI next week. The wound over her left ischial tuberosity is more superficial and underwent debridement today. Finally she has a small open area in her left popliteal fossa the probes on measurably forward superiorly. Still a lot of drainage coming out of this. The CT scan that I ordered 3 weeks ago was questioned by her insurance company wanting a plain x-ray first. As I understand things result of this is nothing has been done in 3 weeks in terms of imaging the thigh and she has an MRI booked of this along with her pelvis for next week line 10/12/16; patient has a deep probing  wound over the right ischiall tuberosity, stage III wound over the left visual tuberosity and a draining sinus in her left popliteal fossa. None of this much different from when I saw this 3 weeks ago. We have been using silver rope to the right ischial wound and a draining area in the left popliteal fossa. Plain silver alginate to the area on the left ischial tuberosity 10/19/16; the patient's wounds are essentially unchanged although the area on the left lower gluteal is actually improved. Our intake nurse noted drainage from the right initial tuberosity probing wound as well as [name], [name] ([id number]) the draining area in the left popliteal fossa. Both of these were cultured. She had x-rays I think at  the insistence of her insurance company on 09/23/16 x-ray of the pelvis was not particularly helpful she did have soft tissue air over the right lower pelvis although with the depth of this wound this is not surprising. An x-ray of her left knee did not show any specific abnormalities. We are still using silver alginate to these wound areas. Her MRI is booked for 10/27 10/26/16; cultures of the purulent drainage in her right initial tuberosity wound grew moderate Proteus and few staph aureus. The same organisms were cultured out of the left knee sinus tract posteriorly. The staph aureus is MRSA. I had started her on Augmentin last week I added doxycycline. The MRI of the left lower extremity and pelvis was finally done. The MRI of the femur showed subcutaneous soft tissue swelling edema fluid and myositis in the vastus lateralis muscle but no soft tissue abscess septic arthritis or osteomyelitis. MRI of the pelvis showed the left wound to be more expensive extending down to the bone there was osteomyelitis. Left hamstring tendons were also involved. No septic arthritis involving the hip. The decubitus ulcer on the right side showed no definite osteomyelitis or abscess.. The right hip wound is actually the one the probes 6 cm downward. But the MRI showing infection including osteomyelitis on the left explains the draining sinus in the popliteal fossa on the left. She did have antibiotics in the hospitalization last time and this extended into her nursing home stay but I'm not exactly sure what antibiotics and for what duration. According the patient this did include vancomycin with considerable effort of our staff we are able to get the patient into see Dr. Sampson Goon today. There were transportation difficulties. Her mother had open heart surgery and is in the ICU in [place] therefore her brother was unable to transport. Dr. Jarrett Ables office graciously arranged time to see her today. From  my point of view she is going to require IV vancomycin plus perhaps a third generation cephalosporin. I plan to keep her on doxycycline and Augmentin until the IV antibiotics can be arranged. Objective Constitutional Sitting or standing Blood Pressure is within target range for patient.. Pulse regular and within target range for patient.Marland Kitchen Respirations regular, non-labored and within target range.. Temperature is normal and within the target range for the patient.. Patient's appearance is neat and clean. Appears in no acute distress. Well nourished and well developed.. Vitals Time Taken: 8:06 AM, Height: 63 in, Weight: 257 lbs, BMI: 45.5, Temperature: 98.0 F, Pulse: 77 bpm, Respiratory Rate: 18 breaths/min, Blood Pressure: 139/62 mmHg. Eyes Conjunctivae clear. No discharge.Marland Kitchen Respiratory Respiratory effort is easy and symmetric bilaterally. Rate is normal at rest and on room air.. Bilateral breath sounds are clear and equal in all lobes with no wheezes, rales or rhonchi.. Cardiovascular  Heart rhythm and rate regular, without murmur or gallop.Elesa Massed. [name], [name] Kitchen. ([id number]) Gastrointestinal (GI) Abdomen is soft and non-distended without masses or tenderness. Bowel sounds active in all quadrants.. General Notes: Wound exam; wounds are not much different from last week. Probing area over the right initial tuberosity goes down 6 cm. We have been using silver alginate packing on this, I was concerned there might be underlying osteomyelitis. Now that that issue has been resolved I wonder about attempting to wound VAC this The MRI showing considerable underlying osteomyelitis in the left ishial tuberosity and also myositis in the vastus lateralis lateralis muscle on the left explains the open area in the popliteal fossa at least by my review of this. She is going to require IV antibiotics. The wound over the left visual tuberosity however continues to improve Integumentary (Hair, Skin) Wound #3  status is Open. Original cause of wound was Pressure Injury. The wound is located on the Right Gluteal fold. The wound measures 1.5cm length x 1cm width x 6.1cm depth; 1.178cm^2 area and 7.186cm^3 volume. There is muscle, fat, and fascia exposed. There is no tunneling or undermining noted. There is a large amount of purulent drainage noted. The wound margin is distinct with the outline attached to the wound base. There is medium (34-66%) pink, pale granulation within the wound bed. There is a medium (34-66%) amount of necrotic tissue within the wound bed including Adherent Slough. The periwound skin appearance exhibited: Maceration, Moist. Periwound temperature was noted as No Abnormality. The periwound has tenderness on palpation. Wound #4 status is Open. Original cause of wound was Pressure Injury. The wound is located on the Left Gluteal fold. The wound measures 0.7cm length x 1cm width x 0.1cm depth; 0.55cm^2 area and 0.055cm^3 volume. There is muscle and fat exposed. There is no tunneling or undermining noted. There is a large amount of serous drainage noted. The wound margin is distinct with the outline attached to the wound base. There is large (67-100%) red, pink granulation within the wound bed. There is a small (1-33%) amount of necrotic tissue within the wound bed including Adherent Slough. The periwound skin appearance exhibited: Maceration, Moist. Periwound temperature was noted as No Abnormality. The periwound has tenderness on palpation. Wound #5 status is Open. Original cause of wound was Gradually Appeared. The wound is located on the Left,Posterior Knee. The wound measures 0.3cm length x 0.3cm width x 0.5cm depth; 0.071cm^2 area and 0.035cm^3 volume. There is muscle and fat exposed. There is no undermining noted, however, there is tunneling at 12:00 with a maximum distance of 15cm. There is a large amount of purulent drainage noted. The wound margin is flat and intact. There is  medium (34-66%) pink granulation within the wound bed. There is a medium (34-66%) amount of necrotic tissue within the wound bed including Adherent Slough. The periwound skin appearance exhibited: Localized Edema, Maceration, Moist, Rubor, Erythema. The periwound skin appearance did not exhibit: Callus, Crepitus, Excoriation, Fluctuance, Friable, Induration, Rash, Scarring, Dry/Scaly, Atrophie Blanche, Cyanosis, Ecchymosis, Hemosiderin Staining, Mottled, Pallor. The surrounding wound skin color is noted with erythema which is circumferential. Periwound temperature was noted as Hot. The periwound has tenderness on palpation. Assessment [name], [name] 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 Cleanse wound with mild soap and water Wound #4 Left Gluteal fold: Clean wound with Normal Saline. Cleanse wound with mild soap and water Wound #5 Left,Posterior Knee: Clean wound with Normal Saline. Cleanse wound with mild soap and water Anesthetic: Wound #3 Right Gluteal fold: Topical Lidocaine 4% cream applied to wound bed prior to debridement - clinic use only Wound #4 Left Gluteal fold: Topical Lidocaine 4% cream applied to wound bed prior to debridement - clinic use only Wound #5 Left,Posterior Knee: Topical Lidocaine 4% cream applied to wound bed prior to debridement - clinic use only Skin Barriers/Peri-Wound Care: Wound #3 Right Gluteal fold: Skin Prep Antifungal cream - ketoconazole to around the wound areas Wound #4 Left Gluteal fold: Skin Prep Antifungal cream - ketoconazole to around the wound areas Wound #5 Left,Posterior Knee: Skin Prep Antifungal cream -  ketoconazole to around the wound areas Primary Wound Dressing: Wound #3 Right Gluteal fold: Aquacel Ag - or equivalent to Silver Alginate Rope pack lightly into wound and leave a tail where it can be pulled out and to cover the outside of the wound. Sorbalgon Ag - used in clinic Wound #4 Left Gluteal fold: Aquacel Ag - Silver Alginate Sorbalgon Ag - used in clinic [name], Ameerah L. ([id number]) Wound #5 Left,Posterior Knee: Iodoform packing Gauze - Pack lightly into wound and leave a tail where it can be pulled out and to cover the outside of the wound. Secondary Dressing: Wound #3 Right Gluteal fold: Dry Gauze Non-adherent pad - Telfa island, tape, or BFD Wound #4 Left Gluteal fold: Dry Gauze Non-adherent pad - Telfa island, tape, or BFD Wound #5 Left,Posterior Knee: Dry Gauze Non-adherent pad - Telfa island, tape, or BFD Dressing Change Frequency: Wound #3 Right Gluteal fold: Change dressing every other day. Wound #4 Left Gluteal fold: Change dressing every other day. Wound #5 Left,Posterior Knee: Change dressing every other day. Follow-up Appointments: Wound #3 Right Gluteal fold: Return Appointment in 1 week. Wound #4 Left Gluteal fold: Return Appointment in 1 week. Wound #5 Left,Posterior Knee: Return Appointment in 1 week. Off-Loading: Wound #3 Right Gluteal fold: Roho cushion for wheelchair - *********[facility] to order*********** Turn and reposition every 2 hours - Museum/gallery curator *********[facility] to order*********** Wound #4 Left Gluteal fold: Roho cushion for wheelchair - *********[facility] to order*********** Turn and reposition every 2 hours Mattress - Museum/gallery curator *********[facility] to order*********** Wound #5 Left,Posterior Knee: Roho cushion for wheelchair - *********[facility] to order*********** Turn and reposition every 2 hours Mattress - Museum/gallery curator *********[facility] to order*********** Home Health: Wound #3 Right  Gluteal fold: Continue Home Health Visits - [facility] *****Please order ROHO cushion and air mattress****** Home Health Nurse may visit PRN to address patient s wound care needs. FACE TO FACE ENCOUNTER: MEDICARE and MEDICAID PATIENTS: I certify that this patient is under my care and that I had a face-to-face encounter that meets the physician face-to-face encounter requirements with this patient on this date. The encounter with the patient was in whole or in part for the following MEDICAL CONDITION: (primary reason for Home Healthcare) MEDICAL NECESSITY: I certify, that based on my findings, NURSING services are a medically necessary home health service. HOME BOUND STATUS: I certify that my clinical findings support that this patient is homebound (i.e., Due to illness or injury, pt requires aid of supportive devices such as crutches, cane, wheelchairs, walkers, the use Poinsett, Kalin L. ([id number])  of special transportation or the assistance of another person to leave their place of residence. There is a normal inability to leave the home and doing so requires considerable and taxing effort. Other absences are for medical reasons / religious services and are infrequent or of short duration when for other reasons). If current dressing causes regression in wound condition, may D/C ordered dressing product/s and apply Normal Saline Moist Dressing daily until next Wound Healing Center / Other MD appointment. Notify Wound Healing Center of regression in wound condition at [phone]. Please direct any NON-WOUND related issues/requests for orders to patient's Primary Care Physician Wound #4 Left Gluteal fold: Continue Home Health Visits - [facility] *****Please order ROHO cushion and air mattress****** Home Health Nurse may visit PRN to address patient s wound care needs. FACE TO FACE ENCOUNTER: MEDICARE and MEDICAID PATIENTS: I certify that this patient is under my care  and that I had a face-to-face encounter that meets the physician face-to-face encounter requirements with this patient on this date. The encounter with the patient was in whole or in part for the following MEDICAL CONDITION: (primary reason for Home Healthcare) MEDICAL NECESSITY: I certify, that based on my findings, NURSING services are a medically necessary home health service. HOME BOUND STATUS: I certify that my clinical findings support that this patient is homebound (i.e., Due to illness or injury, pt requires aid of supportive devices such as crutches, cane, wheelchairs, walkers, the use of special transportation or the assistance of another person to leave their place of residence. There is a normal inability to leave the home and doing so requires considerable and taxing effort. Other absences are for medical reasons / religious services and are infrequent or of short duration when for other reasons). If current dressing causes regression in wound condition, may D/C ordered dressing product/s and apply Normal Saline Moist Dressing daily until next Wound Healing Center / Other MD appointment. Notify Wound Healing Center of regression in wound condition at [phone]. Please direct any NON-WOUND related issues/requests for orders to patient's Primary Care Physician Wound #5 Left,Posterior Knee: Continue Home Health Visits - [facility] Nurse may visit PRN to address patient s wound care needs. FACE TO FACE ENCOUNTER: MEDICARE and MEDICAID PATIENTS: I certify that this patient is under my care and that I had a face-to-face encounter that meets the physician face-to-face encounter requirements with this patient on this date. The encounter with the patient was in whole or in part for the following MEDICAL CONDITION: (primary reason for Home Healthcare) MEDICAL NECESSITY: I certify, that based on my findings, NURSING services are a medically necessary home health  service. HOME BOUND STATUS: I certify that my clinical findings support that this patient is homebound (i.e., Due to illness or injury, pt requires aid of supportive devices such as crutches, cane, wheelchairs, walkers, the use of special transportation or the assistance of another person to leave their place of residence. There is a normal inability to leave the home and doing so requires considerable and taxing effort. Other absences are for medical reasons / religious services and are infrequent or of short duration when for other reasons). If current dressing causes regression in wound condition, may D/C ordered dressing product/s and apply Normal Saline Moist Dressing daily until next Wound Healing Center / Other MD appointment. Notify Wound Healing Center of regression in wound condition at [phone]. Please direct any NON-WOUND related issues/requests for orders to patient's Primary Care Physician Medications-please  add to medication list.: Wound #3 Right Gluteal fold: Other: - Vitamin C, Zinc, Multivitamin ketoconazole Wound #4 Left Gluteal fold: Other: - Vitamin C, Zinc, Multivitamin ketoconazole Wound #5 Left,Posterior Knee: P.O. Antibiotics - Augmentin and Doxycycline Other: - Vitamin C, Zinc, Multivitamin ketoconazole Consults ordered were: Aldaz, Quinn L. ([id number]) Infectious Disease - Dr. Phillips Climes o o #1 we are going to continue the silver alginate in the right initial tuberosity and silver alginate over the left ischial tuberosity wound. The left ischila tuberosity wound continues to show improvement and is superficial. The area in the left popliteal fossae think is an exit site. #2 graciously Dr. Jarrett Ables office offered to see this woman who has transportation issues on a urgent basis today #3 I would like to continue her on doxycycline on Augmentin until IV antibiotics can be arranged Electronic Signature(s) Signed: 10/26/2016 5:04:57 PM By: Katelyn Najjar  MD Entered By: [name] on 10/26/2016 09:36:33 Belland, [name] ([id number]) -------------------------------------------------------------------------------- SuperBill Details Patient Name: [name] Date of Service: 10/26/2016 Medical Record Patient Account Number: [id number] Number: Treating RN: [name] 10/11/1961 (55 y.o. 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

## 2016-11-02 ENCOUNTER — Encounter: Payer: Managed Care, Other (non HMO) | Admitting: Nurse Practitioner

## 2016-11-02 ENCOUNTER — Ambulatory Visit
Admission: RE | Admit: 2016-11-02 | Discharge: 2016-11-02 | Disposition: A | Discharge status: Home / Self Care | Payer: Managed Care, Other (non HMO) | Source: Ambulatory Visit | Attending: Infectious Diseases | Admitting: Infectious Diseases

## 2016-11-02 DIAGNOSIS — Z95828 Presence of other vascular implants and grafts: Secondary | ICD-10-CM

## 2016-11-02 DIAGNOSIS — L89314 Pressure ulcer of right buttock, stage 4: Secondary | ICD-10-CM | POA: Insufficient documentation

## 2016-11-02 DIAGNOSIS — L89323 Pressure ulcer of left buttock, stage 3: Secondary | ICD-10-CM | POA: Insufficient documentation

## 2016-11-02 DIAGNOSIS — Z452 Encounter for adjustment and management of vascular access device: Secondary | ICD-10-CM | POA: Diagnosis present

## 2016-11-02 LAB — GLUCOSE, CAPILLARY: Glucose-Capillary: 173 mg/dL — ABNORMAL HIGH (ref 65–99)

## 2016-11-02 MED ORDER — PIPERACILLIN-TAZOBACTAM 3.375 G IVPB 30 MIN
3.3750 g | Freq: Once | INTRAVENOUS | Status: AC
  Administered 2016-11-02: 3.375 g via INTRAVENOUS
  Filled 2016-11-02: qty 50

## 2016-11-02 MED ORDER — VANCOMYCIN HCL 10 G IV SOLR
1500.0000 mg | Freq: Once | INTRAVENOUS | Status: AC
  Administered 2016-11-02: 1500 mg via INTRAVENOUS
  Filled 2016-11-02: qty 1500

## 2016-11-02 MED ORDER — SODIUM CHLORIDE FLUSH 0.9 % IV SOLN
INTRAVENOUS | Status: AC
  Filled 2016-11-02: qty 30

## 2016-11-02 MED ORDER — SODIUM CHLORIDE 0.9 % IV SOLN
INTRAVENOUS | Status: DC
  Administered 2016-11-02: 12:00:00 via INTRAVENOUS

## 2016-11-02 MED ORDER — SODIUM CHLORIDE 0.9% FLUSH: 10.0000 mL | Freq: Once | INTRAVENOUS | Status: DC

## 2016-11-02 NOTE — OR Nursing (Signed)
drsgs on bottom of buttocks bilaterally and back of left knee

## 2016-11-02 NOTE — OR Nursing (Signed)
picc line placement verified by Xray before antibiotics started

## 2016-11-02 NOTE — Progress Notes (Signed)
Pharmacy Antibiotic Note  [name] is a 55 y.o. female admitted on 11/02/2016 with infection on lower buttocks.  Pharmacy has been consulted for vancomycin dosing. Spoke with SDS RN who put the consult in who stated that pt has an infection on lower buttock (unclear if just cellulitis), pt sent from wound center for PICC line insertion to receive one dose of vanc/Zosyn before discharge. Pt currently on Augmentin and doxycycline (no IV abx currently). Most recent SCr from 11/1 of 0.9 in Care Everywhere.   Plan: Vancomycin 1500 mg IV x1    Height: 5\' 3"  (160 cm) Weight: 257 lb (116.6 kg) IBW/kg (Calculated) : 52.4  Adj BW 78 kg  Temp (24hrs), Avg:98.5 F (36.9 C), Min:98.5 F (36.9 C), Max:98.5 F (36.9 C)  No results for input(s): WBC, CREATININE, LATICACIDVEN, VANCOTROUGH, VANCOPEAK, VANCORANDOM, GENTTROUGH, GENTPEAK, GENTRANDOM, TOBRATROUGH, TOBRAPEAK, TOBRARND, AMIKACINPEAK, AMIKACINTROU, AMIKACIN in the last 168 hours.  CrCl cannot be calculated (Patient's most recent lab result is older than the maximum 21 days allowed.).    Allergies  Allergen Reactions  . Biaxin [Clarithromycin] Rash    Patient states this medication gives her severe rash and thrush in the mouth.      Thank you for allowing pharmacy to be a part of this patient's care.  L 11/02/2016 10:20 AM

## 2016-11-03 NOTE — Progress Notes (Addendum)
[name], [name] ([id number]) Visit Report for 11/02/2016 Chief Complaint Document Details Patient Name: [name] Date of Service: 11/02/2016 8:00 AM Medical Record Number: [id number] Patient Account Number: [id number] Date of Birth/Sex: [birth year] (55 y.o. Female) Treating RN: Phillis Haggis Primary Care Physician: Joen [name] Other Clinician: Referring Physician: Joen [name] Treating Physician/Extender: Kathreen Cosier in Treatment: 6 Information Obtained from: Patient Chief Complaint Ms. Velaquez presents today for routine managament of bilateral ischial pressure ulcers and sinus tract to left popliteal fossa. Electronic Signature(s) Signed: 11/03/2016 2:25:42 AM By: Bonnell Public Entered By: Bonnell Public on 11/02/2016 08:41:30 [name], [name] ([id number]) -------------------------------------------------------------------------------- Debridement Details Patient Name: [name] Date of Service: 11/02/2016 8:00 AM Medical Record Number: [id number] Patient Account Number: [id number] Date of Birth/Sex: [birth year] (55 y.o. Female) Treating RN: Ashok Cordia, Debi Primary Care Physician: Joen [name] Other Clinician: Referring Physician: Joen [name] Treating Physician/Extender: Kathreen Cosier in Treatment: 6 Debridement Performed for Wound #3 Right Gluteal fold Assessment: Performed By: Physician Bonnell Public, NP Debridement: Debridement Pre-procedure Yes - 08:24 Verification/Time Out Taken: Start Time: 08:25 Pain Control: Lidocaine 4% Topical Solution Level: Skin/Subcutaneous Tissue Total Area Debrided (L x 1.5 (cm) x 0.9 (cm) = 1.35 (cm) W): Tissue and other Viable, Non-Viable, Other, Subcutaneous material debrided: Instrument: Curette Bleeding: None End Time: 08:28 Procedural Pain: 0 Post Procedural Pain: 0 Response to Treatment: Procedure was tolerated well Post Debridement Measurements of Total Wound Length: (cm) 1.5 Stage: Category/Stage IV Width:  (cm) 0.9 Depth: (cm) 7.5 Volume: (cm) 7.952 Character of Wound/Ulcer Post Requires Further Debridement: Debridement Severity of Tissue Post Fat layer exposed Debridement: Post Procedure Diagnosis Same as Pre-procedure Electronic Signature(s) Signed: 11/02/2016 5:51:11 PM By: Alejandro Mulling Signed: 11/03/2016 2:25:42 AM By: Bonnell Public Entered By: Bonnell Public on 11/02/2016 09:26:59 [name], [name] ([id number]) [name], [name] ([id number]) -------------------------------------------------------------------------------- HPI Details Patient Name: [name] Date of Service: 11/02/2016 8:00 AM Medical Record Number: [id number] Patient Account Number: [id number] Date of Birth/Sex: 02-Sep-1961 (55 y.o. Female) Treating RN: Ashok Cordia, Debi Primary Care Physician: Joen [name] Other Clinician: Referring Physician: Joen [name] Treating Physician/Extender: Kathreen Cosier in Treatment: 6 History of Present Illness Location: bilateral gluteal ulcerations and left popliteal ulceration with tunneling Quality: adits to intermittent aching to ischial ulcers, no discomfort to sinus tract Severity: right iscial ulcer with increased depth Duration: chronic ulcers to bilateral ischial ulcers and sinus tract Timing: The pain is intermittent in severity as far as how intense it becomes but is present all the time. Manipulationn makes this worse. Context: The wound occurred when the patient had a fall and was unconscious for about 48 hours laying on the floor and she had pressure injury at that stage. Modifying Factors: Other treatment(s) tried include:as noted below she has been seen by visiting wound care physicians or nurse practitioners and details have been noted Associated Signs and Symptoms: has yet to receive offloading cushions and mattress overlay HPI Description: 55 year old patient who was seen by visiting [name] wound care specialist for a wound on both her buttock and was found  to have an unstageable wound on the right buttock for about 2 months. I understand that she had a fall and was laying on the floor for about 48 hours before she was found and taken to the ICU and had a long injury to her gluteal area from pressure and also had broken her right humerus. She has had a right proximal humerus fracture and has been followed up with orthopedics recently. The patient has  a past medical history of type 2 diabetes mellitus, paraparesis, acute pyelonephritis, GERD, hypertension, glaucoma, chronic pain, anxiety neurosis, nicotine dependence, COPD. the patient had some debridement done and was to operative was recommended to use Silvadene dressing and offloading. She is a smoker and occasionally smokes a few cigarettes. the patient requested a second opinion for months and is here to discuss her care. 09/21/16; the patient re-presents from home today for review of 3 different wounds. I note that she was seen in the clinic here in July at which time she had bilateral buttock wounds. It was apparently suggested at that time that she use a wound VAC bridged to both wounds just near the initial tuberosity's bilaterally which she refused. The history was a bit difficult to put together. Apparently this patient became ill at the end of April of this year. She was found sitting on the floor she had apparently been for 2 days and subsequently admitted to hospital from 04/23/16 through 05/02/16 and at that point she was critically ill ultimately having sepsis secondary to UTI, nontraumatic rhabdomyolysis and diabetic ketoacidosis. She had acute renal failure and I think required ICU care including intubation. Patient states her wounds actually started at that point on the bilateral issue tuberosities however in reviewing the discharge summary from 5/8 I see no reference to wounds at that point. It did state that she had left lower extremity cellulitis however. Reviewing Epic I see  no relevant x-rays. It would appear that her discharge creatinine was within the normal range and indeed on 9/15 her creatinine has remained normal. [facility] There the wounds on her bilateral Botox were dressed. Only just before her discharge from the nursing facility she developed an "knot" which was interpreted as cellulitis on the posterior aspect of her Henly, Kelsi L. ([id number]) left knee she was given antibiotics. Apparently sometime late in July a this actually opened and became a wound at home health care was tending to however she is still having purulent drainage coming from this and by my understanding the wound depth is actually become unmeasurable. I am not really clear about what home health has been placing in any of these wound areas. The patient states that is something with silver and it. She is not been systemically unwell no fever or chills her appetite is good. She is a diabetic poorly controlled however she states that her recent blood sugars at home have been in the low to mid 100s. 09/28/16 On evaluation today patient appears to continue to exhibit the 3 areas of ulceration that were noted previous. She did have an x-ray of the right pelvis which showed evidence of potential soft tissue infection but no obvious osteomyelitis. There was a discussion last office visit concerning the possibility of a wound VAC. Witth that being said the x-ray report suggested that an MRI may be more appropriate to further evaluate the extent. Subsequently in regard to the wound over the popliteal portion of the left lower extremity with tunneling at 12:00 the CT scan that was ordered was denied by insurance as they state the patient has not had x-rays prior to advanced imaging. Patient states that she is frustrated with the situation overall. 10/05/16 in the interval since I last saw this patient last week she has had the x-ray of  the knee performed. I did review that x-ray today and fortunately shows no evidence of osteomyelitis or other acute abnormality at this point  in time. She continues to have the opening iin the posterior oral popliteal space with tracking proximal up the posterior thigh. Nothing seems to have worsened but it also seems to have not improved. The same is true in regard to the right pressure ulcer over the gluteal region which extends toward the ischium. The left gluteal pressure ulcer actually appears to be doing somewhat better my opinion there is some necrotic slough but overall this appears fairly well. She tells me thatt she has some discomfort especially when home health is helping her with dressing changes as they do not know her. At worse she rates her pain to be a 5 out of 10 right now it's more like a 1 out of 10. 10/07/16; still the patient has 3 different wound areas. She has a deep stage IV wound over her right ischial tuberosity. She is due to have an MRI next week. The wound over her left ischial tuberosity is more superficial and underwent debridement today. Finally she has a small open area in her left popliteal fossa the probes on measurably forward superiorly. Still a lot of drainage coming out of this. The CT scan that I ordered 3 weeks ago was questioned by her insurance company wanting a plain x-ray first. As I understand things result of this is nothing has been done in 3 weeks in terms of imaging the thigh and she has an MRI booked of this along with her pelvis for next week line 10/12/16; patient has a deep probing wound over the right ischiall tuberosity, stage III wound over the left visual tuberosity and a draining sinus in her left popliteal fossa. None of this much different from when I saw this 3 weeks ago. We have been using silver rope to the right ischial wound and a draining area in the left popliteal fossa. Plain silver alginate to the area on the left ischial  tuberosity 10/19/16; the patient's wounds are essentially unchanged although the area on the left lower gluteal is actually improved. Our intake nurse noted drainage from the right initial tuberosity probing wound as well as the draining area in the left popliteal fossa. Both of these were cultured. She had x-rays I think at the insistence of her insurance company on 09/23/16 x-ray of the pelvis was not particularly helpful she did have soft tissue air over the right lower pelvis although with the depth of this wound this is not surprising. An x-ray of her left knee did not show any specific abnormalities. We are still using silver alginate to these wound areas. Her MRI is booked for 10/27 10/26/16; cultures of the purulent drainage in her right initial tuberosity wound grew moderate Proteus and few staph aureus. The same organisms were cultured out of the left knee sinus tract posteriorly. The staph aureus is MRSA. I had started her on Augmentin last week I added doxycycline. The MRI of the left lower extremity and pelvis was finally done. The MRI of the femur showed subcutaneous soft tissue swelling edema fluid and myositis in the vastus lateralis muscle but no soft tissue abscess septic arthritis or osteomyelitis. MRI of the pelvis showed the left wound to be more expensive extending down to the bone [name], [name] ([id number]) there was osteomyelitis. Left hamstring tendons were also involved. No septic arthritis involving the hip. The decubitus ulcer on the right side showed no definite osteomyelitis or abscess.. The right hip wound is actually the one the probes 6 cm downward. But the MRI showing  infection including osteomyelitis on the left explains the draining sinus in the popliteal fossa on the left. She did have antibiotics in the hospitalization last time and this extended into her nursing home stay but I'm not exactly sure what antibiotics and for what duration. According the patient  this did include vancomycin with considerable effort of our staff we are able to get the patient into see Dr. Sampson Goon today. There were transportation difficulties. Her mother had open heart surgery and is in the ICU in [place] therefore her brother was unable to transport. Dr. Jarrett Ables office graciously arranged time to see her today. From my point of view she is going to require IV vancomycin plus perhaps a third generation cephalosporin. I plan to keep her on doxycycline and Augmentin until the IV antibiotics can be arranged. 11/02/2016 - Natilee presents today for management of ulcers; She saw Dr. 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 She is to have the PICC line placed today and receive the initial dose for both antibiotics today. She has yet to receive the offloading chair cushion and/or mattress overlay from home health, apparently this has been a 3 week process. I encouraged her to speak to home health regarding this matter, along with offering home health to contact the wouns care center with any questions or concerns. The left ischial pressure ulcer continues to imporve, while to right ischial ulcer has increased in depth. The popliteal fossa sinus tract remains unmeasurable due to the limitation of depth measurement (tract extends beyond our measuring devices). Electronic Signature(s) Signed: 11/03/2016 2:25:42 AM By: Bonnell Public Entered By: Bonnell Public on 11/02/2016 08:49:56 [name], [name] ([id number]) -------------------------------------------------------------------------------- Physical Exam Details Patient Name: [name] Date of Service: 11/02/2016 8:00 AM Medical Record Number: [id number] Patient Account Number: [id number] Date of Birth/Sex: 1961-10-16 (55 y.o. Female) Treating RN: Ashok Cordia, Debi Primary Care Physician: Joen [name] Other Clinician: Referring  Physician: Joen [name] Treating Physician/Extender: Kathreen Cosier in Treatment: 6 Constitutional BP within normal limits. afebrile. Respiratory non-labored respiratory effort. Musculoskeletal restricted activity to wheelchair and bed; she admits to spending most of day in bed on side, but sleeps on back. Psychiatric appears to make sound judgement and have accurate insight regarding healthcare; expressing concern regarading delay in bed and chair cushions, plans to discuss with home health. oriented to time, place, person and situation. calm, pleasant, conversive, talkative and interactive. Notes Integumentary - right ischial pressure ulcer superficial at this appointment with pale granulation tissue; right ischial pressure ulcer continues to probe to peri-osteum/bone with sterile cotton tip applicator at 10 o'clock location, deth increasing; left sinus tract reamins, serosanguinous drainage with palpation, denies pain with palpation and manipulation Electronic Signature(s) Signed: 11/03/2016 2:25:42 AM By: Bonnell Public Entered By: Bonnell Public on 11/02/2016 09:21:19 [name], [name] ([id number]) -------------------------------------------------------------------------------- Physician Orders Details Patient Name: [name] Date of Service: 11/02/2016 8:00 AM Medical Record Number: [id number] Patient Account Number: [id number] Date of Birth/Sex: 18-Apr-1961 (55 y.o. Female) Treating RN: Ashok Cordia, Debi Primary Care Physician: Joen [name] Other Clinician: Referring Physician: Joen [name] Treating Physician/Extender: Kathreen Cosier in Treatment: 6 Verbal / Phone Orders: Yes Clinician: Ashok Cordia, Debi Read Back and Verified: Yes Diagnosis Coding Wound Cleansing Wound #3 Right Gluteal fold o Clean wound with Normal Saline. o Cleanse wound with mild soap and water Wound #4 Left Gluteal fold o Clean wound with Normal Saline. o Cleanse wound with mild soap  and water Wound #  5 Left,Posterior Knee o Clean wound with Normal Saline. o Cleanse wound with mild soap and water Anesthetic Wound #3 Right Gluteal fold o Topical Lidocaine 4% cream applied to wound bed prior to debridement - clinic use only Wound #4 Left Gluteal fold o Topical Lidocaine 4% cream applied to wound bed prior to debridement - clinic use only Wound #5 Left,Posterior Knee o Topical Lidocaine 4% cream applied to wound bed prior to debridement - clinic use only Skin Barriers/Peri-Wound Care Wound #3 Right Gluteal fold o Skin Prep o Antifungal cream - ketoconazole to around the wound areas Wound #4 Left Gluteal fold o Skin Prep o Antifungal cream - ketoconazole to around the wound areas Wound #5 Left,Posterior Knee o Skin Prep o Antifungal cream - ketoconazole to around the wound areas Primary Wound Dressing Petros, Anajulia L. ([id number]) Wound #3 Right Gluteal fold o Aquacel Ag - or equivalent to Silver Alginate Rope pack lightly into wound and leave a tail where it can be pulled out and to cover the outside of the wound. o Sorbalgon Ag - used in clinic Wound #4 Left Gluteal fold o Aquacel Ag - Silver Alginate o Sorbalgon Ag - used in clinic Wound #5 Left,Posterior Knee o Iodoform packing Gauze - Pack lightly into wound at 12 o'clock and leave a tail where it can be pulled out and to cover the outside of the wound. Secondary Dressing Wound #3 Right Gluteal fold o Dry Gauze o Non-adherent pad - Telfa island, tape, or BFD Wound #4 Left Gluteal fold o Dry Gauze o Non-adherent pad - Telfa island, tape, or BFD Wound #5 Left,Posterior Knee o Dry Gauze o Non-adherent pad - Telfa island, tape, or BFD Dressing Change Frequency Wound #3 Right Gluteal fold o Change dressing every other day. Wound #4 Left Gluteal fold o Change dressing every other day. Wound #5 Left,Posterior Knee o Change dressing every other  day. Follow-up Appointments Wound #3 Right Gluteal fold o Return Appointment in 1 week. Wound #4 Left Gluteal fold o Return Appointment in 1 week. Wound #5 Left,Posterior Knee o Return Appointment in 1 week. Off-Loading Wound #3 Right Gluteal fold Wiseman, Evan L. ([id number]) o Roho cushion for wheelchair - *********[facility] to order*********** o Turn and reposition every 2 hours - Museum/gallery curator *********[facility] to order*********** Wound #4 Left Gluteal fold o Roho cushion for wheelchair - *********[facility] to order*********** o Turn and reposition every 2 hours o Mattress - Museum/gallery curator *********[facility] to order*********** Wound #5 Left,Posterior Knee o Roho cushion for wheelchair - *********[facility] to order*********** o Turn and reposition every 2 hours o Mattress - Museum/gallery curator *********[facility] to order*********** Home Health Wound #3 Right Gluteal fold o Continue Home Health Visits - [facility] *****Please order ROHO cushion and air mattress****** o Home Health Nurse may visit PRN to address patientos wound care needs. o FACE TO FACE ENCOUNTER: MEDICARE and MEDICAID PATIENTS: I certify that this patient is under my care and that I had a face-to-face encounter that meets the physician face-to-face encounter requirements with this patient on this date. The encounter with the patient was in whole or in part for the following MEDICAL CONDITION: (primary reason for Home Healthcare) MEDICAL NECESSITY: I certify, that based on my findings, NURSING services are a medically necessary home health service. HOME BOUND STATUS: I certify that my clinical findings support that this patient is homebound (i.e., Due to illness or injury, pt requires aid  of supportive devices such as crutches, cane, wheelchairs, walkers, the use of special transportation or the assistance of another person to leave their place  of residence. There is a normal inability to leave the home and doing so requires considerable and taxing effort. Other absences are for medical reasons / religious services and are infrequent or of short duration when for other reasons). o If current dressing causes regression in wound condition, may D/C ordered dressing product/s and apply Normal Saline Moist Dressing daily until next Wound Healing Center / Other MD appointment. Notify Wound Healing Center of regression in wound condition at [phone]. o Please direct any NON-WOUND related issues/requests for orders to patient's Primary Care Physician Wound #4 Left Gluteal fold o Continue [facility]its - [facility] *****Please order ROHO cushion and air mattress****** o Home Health Nurse may visit PRN to address patientos wound care needs. o FACE TO FACE ENCOUNTER: MEDICARE and MEDICAID PATIENTS: I certify that this patient is under my care and that I had a face-to-face encounter that meets the physician face-to-face encounter requirements with this patient on this date. The encounter with the patient was in whole or in part for the following MEDICAL CONDITION: (primary reason for Home Healthcare) MEDICAL NECESSITY: I certify, that based on my findings, NURSING services are a medically necessary home health service. HOME BOUND STATUS: I certify that my clinical findings Pineau, Dhani L. ([id number]) support that this patient is homebound (i.e., Due to illness or injury, pt requires aid of supportive devices such as crutches, cane, wheelchairs, walkers, the use of special transportation or the assistance of another person to leave their place of residence. There is a normal inability to leave the home and doing so requires considerable and taxing effort. Other absences are for medical reasons / religious services and are infrequent or of short duration when for other reasons). o If current dressing causes  regression in wound condition, may D/C ordered dressing product/s and apply Normal Saline Moist Dressing daily until next Wound Healing Center / Other MD appointment. Notify Wound Healing Center of regression in wound condition at [phone]. o Please direct any NON-WOUND related issues/requests for orders to patient's Primary Care Physician Wound #5 Left,Posterior Knee o Continue [facility] Visits - [facility] *****Please order ROHO cushion and air mattress****** o Home Health Nurse may visit PRN to address patientos wound care needs. o FACE TO FACE ENCOUNTER: MEDICARE and MEDICAID PATIENTS: I certify that this patient is under my care and that I had a face-to-face encounter that meets the physician face-to-face encounter requirements with this patient on this date. The encounter with the patient was in whole or in part for the following MEDICAL CONDITION: (primary reason for Home Healthcare) MEDICAL NECESSITY: I certify, that based on my findings, NURSING services are a medically necessary home health service. HOME BOUND STATUS: I certify that my clinical findings support that this patient is homebound (i.e., Due to illness or injury, pt requires aid of supportive devices such as crutches, cane, wheelchairs, walkers, the use of special transportation or the assistance of another person to leave their place of residence. There is a normal inability to leave the home and doing so requires considerable and taxing effort. Other absences are for medical reasons / religious services and are infrequent or of short duration when for other reasons). o If current dressing causes regression in wound condition, may D/C ordered dressing product/s and apply Normal Saline Moist Dressing daily until next Wound  Healing Center / Other MD appointment. Notify Wound Healing Center of regression in wound condition at [phone]. o Please direct any NON-WOUND related issues/requests  for orders to patient's Primary Care Physician Medications-please add to medication list. Wound #3 Right Gluteal fold o Other: - Vitamin C, Zinc, Multivitamin ketoconazole Wound #4 Left Gluteal fold o Other: - Vitamin C, Zinc, Multivitamin ketoconazole Wound #5 Left,Posterior Knee o P.O. Antibiotics - Augmentin and Doxycycline o Other: - Vitamin C, Zinc, Multivitamin ketoconazole Deltoro, Keishla L. ([id number]) Electronic Signature(s) Signed: 11/02/2016 5:51:11 PM By: Alejandro Mulling Signed: 11/03/2016 2:25:42 AM By: Bonnell Public Entered By: Alejandro Mulling on 11/02/2016 09:08:14 [name], [name] ([id number]) -------------------------------------------------------------------------------- Problem List Details Patient Name: [name] Date of Service: 11/02/2016 8:00 AM Medical Record Number: [id number] Patient Account Number: [id number] Date of Birth/Sex: 1961/01/13 (55 y.o. 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 ([id number]) -------------------------------------------------------------------------------- Progress Note Details Patient Name: [name] Date of Service: 11/02/2016 8:00 AM Medical Record Number: [id number] Patient  Account Number: [id number] Date of Birth/Sex: [birth year] (55 y.o. Female) Treating RN: Phillis Haggis Primary Care Physician: Joen [name] Other Clinician: Referring Physician: Joen [name] Treating Physician/Extender: Kathreen Cosier in Treatment: 6 Subjective Chief Complaint Information obtained from Patient Ms. Bevans presents today for routine managament of bilateral ischial pressure ulcers and sinus tract to left popliteal fossa. History of Present Illness (HPI) The following HPI elements were documented for the patient's wound: Location: bilateral gluteal ulcerations and left popliteal ulceration with tunneling Quality: adits to intermittent aching to ischial ulcers, no discomfort to sinus tract Severity: right iscial ulcer with increased depth Duration: chronic ulcers to bilateral ischial ulcers and sinus tract Timing: The pain is intermittent in severity as far as how intense it becomes but is present all the time. Manipulationn makes this worse. Context: The wound occurred when the patient had a fall and was unconscious for about 48 hours laying on the floor and she had pressure injury at that stage. Modifying Factors: Other treatment(s) tried include:as noted below she has been seen by visiting wound care physicians or nurse practitioners and details have been noted Associated Signs and Symptoms: has yet to receive offloading cushions and mattress overlay 56 year old patient who was seen by visiting [name] wound care specialist for a wound on both her buttock and was found to have an unstageable wound on the right buttock for about 2 months. I understand that she had a fall and was laying on the floor for about 48 hours before she was found and taken to the ICU and had a long injury to her gluteal area from pressure and also had broken her right humerus. She has had a right proximal humerus fracture and has been followed up with orthopedics recently. The patient has a past  medical history of type 2 diabetes mellitus, paraparesis, acute pyelonephritis, GERD, hypertension, glaucoma, chronic pain, anxiety neurosis, nicotine dependence, COPD. the patient had some debridement  done and was to operative was recommended to use Silvadene dressing and offloading. She is a smoker and occasionally smokes a few cigarettes. the patient requested a second opinion for months and is here to discuss her care. 09/21/16; the patient re-presents from home today for review of 3 different wounds. I note that she was seen in the clinic here in July at which time she had bilateral buttock wounds. It was apparently suggested at that time that she use a wound VAC bridged to both wounds just near the initial tuberosity's bilaterally which she refused. The history was a bit difficult to put together. Apparently this patient became ill at the end of April of this year. She was found sitting on the floor she had apparently been for 2 days and subsequently [facility] ([id number][facility] from 04/23/16 through 05/02/16 and at that point she was critically ill ultimately having sepsis secondary to UTI, nontraumatic rhabdomyolysis and diabetic ketoacidosis. She had acute renal failure and I think required ICU care including intubation. Patient states her wounds actually started at that point on the bilateral issue tuberosities however in reviewing the discharge summary from 5/8 I see no reference to wounds at that point. It did state that she had left lower extremity cellulitis however. Reviewing Epic I see no relevant x-rays. It would appear that her discharge creatinine was within the normal range and indeed on 9/15 her creatinine has remained normal. [facility] There the wounds on her bilateral Botox were dressed. Only just before her discharge from the nursing facility she developed an "knot" which was interpreted as  cellulitis on the posterior aspect of her left knee she was given antibiotics. Apparently sometime late in July a this actually opened and became a wound at home health care was tending to however she is still having purulent drainage coming from this and by my understanding the wound depth is actually become unmeasurable. I am not really clear about what home health has been placing in any of these wound areas. The patient states that is something with silver and it. She is not been systemically unwell no fever or chills her appetite is good. She is a diabetic poorly controlled however she states that her recent blood sugars at home have been in the low to mid 100s. 09/28/16 On evaluation today patient appears to continue to exhibit the 3 areas of ulceration that were noted previous. She did have an x-ray of the right pelvis which showed evidence of potential soft tissue infection but no obvious osteomyelitis. There was a discussion last office visit concerning the possibility of a wound VAC. Witth that being said the x-ray report suggested that an MRI may be more appropriate to further evaluate the extent. Subsequently in regard to the wound over the popliteal portion of the left lower extremity with tunneling at 12:00 the CT scan that was ordered was denied by insurance as they state the patient has not had x-rays prior to advanced imaging. Patient states that she is frustrated with the situation overall. 10/05/16 in the interval since I last saw this patient last week she has had the x-ray of the knee performed. I did review that x-ray today and fortunately shows no evidence of osteomyelitis or other acute abnormality at this point in time. She continues to have the opening iin the posterior oral popliteal space with tracking proximal up the posterior thigh. Nothing seems to have worsened but it  also seems to have not improved. The same is true in regard to the right pressure ulcer over the  gluteal region which extends toward the ischium. The left gluteal pressure ulcer actually appears to be doing somewhat better my opinion there is some necrotic slough but overall this appears fairly well. She tells me thatt she has some discomfort especially when home health is helping her with dressing changes as they do not know her. At worse she rates her pain to be a 5 out of 10 right now it's more like a 1 out of 10. 10/07/16; still the patient has 3 different wound areas. She has a deep stage IV wound over her right ischial tuberosity. She is due to have an MRI next week. The wound over her left ischial tuberosity is more superficial and underwent debridement today. Finally she has a small open area in her left popliteal fossa the probes on measurably forward superiorly. Still a lot of drainage coming out of this. The CT scan that I ordered 3 weeks ago was questioned by her insurance company wanting a plain x-ray first. As I understand things result of this is nothing has been done in 3 weeks in terms of imaging the thigh and she has an MRI booked of this along with her pelvis for next week line 10/12/16; patient has a deep probing wound over the right ischiall tuberosity, stage III wound over the left visual tuberosity and a draining sinus in her left popliteal fossa. None of this much different from when I saw this 3 weeks ago. We have been using silver rope to the right ischial wound and a draining area in the left popliteal fossa. Plain silver alginate to the area on the left ischial tuberosity 10/19/16; the patient's wounds are essentially unchanged although the area on the left lower gluteal is actually improved. Our intake nurse noted drainage from the right initial tuberosity probing wound as well as the draining area in the left popliteal fossa. Both of these were cultured. She had x-rays I think at the insistence of her insurance company on 09/23/16 x-ray of the pelvis was not  particularly helpful she did have Rosenow, Sila L. ([id number]) soft tissue air over the right lower pelvis although with the depth of this wound this is not surprising. An x-ray of her left knee did not show any specific abnormalities. We are still using silver alginate to these wound areas. Her MRI is booked for 10/27 10/26/16; cultures of the purulent drainage in her right initial tuberosity wound grew moderate Proteus and few staph aureus. The same organisms were cultured out of the left knee sinus tract posteriorly. The staph aureus is MRSA. I had started her on Augmentin last week I added doxycycline. The MRI of the left lower extremity and pelvis was finally done. The MRI of the femur showed subcutaneous soft tissue swelling edema fluid and myositis in the vastus lateralis muscle but no soft tissue abscess septic arthritis or osteomyelitis. MRI of the pelvis showed the left wound to be more expensive extending down to the bone there was osteomyelitis. Left hamstring tendons were also involved. No septic arthritis involving the hip. The decubitus ulcer on the right side showed no definite osteomyelitis or abscess.. The right hip wound is actually the one the probes 6 cm downward. But the MRI showing infection including osteomyelitis on the left explains the draining sinus in the popliteal fossa on the left. She did have antibiotics in the hospitalization last time and  this extended into her nursing home stay but I'm not exactly sure what antibiotics and for what duration. According the patient this did include vancomycin with considerable effort of our staff we are able to get the patient into see Dr. Sampson Goon today. There were transportation difficulties. Her mother had open heart surgery and is in the ICU in [place] therefore her brother was unable to transport. Dr. Jarrett Ables office graciously arranged time to see her today. From my point of view she is going to require IV vancomycin  plus perhaps a third generation cephalosporin. I plan to keep her on doxycycline and Augmentin until the IV antibiotics can be arranged. 11/02/2016 - Alicia presents today for management of ulcers; She saw Dr. 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 She is to have the PICC line placed today and receive the initial dose for both antibiotics today. She has yet to receive the offloading chair cushion and/or mattress overlay from home health, apparently this has been a 3 week process. I encouraged her to speak to home health regarding this matter, along with offering home health to contact the wouns care center with any questions or concerns. The left ischial pressure ulcer continues to imporve, while to right ischial ulcer has increased in depth. The popliteal fossa sinus tract remains unmeasurable due to the limitation of depth measurement (tract extends beyond our measuring devices). Objective Constitutional BP within normal limits. afebrile. Vitals Time Taken: 8:05 AM, Height: 63 in, Weight: 257 lbs, BMI: 45.5, Temperature: 98.3 F, Pulse: 78 bpm, Respiratory Rate: 18 breaths/min, Blood Pressure: 134/60 mmHg. Respiratory non-labored respiratory effort. Musculoskeletal Raupp, Philomene L. ([id number]) restricted activity to wheelchair and bed; she admits to spending most of day in bed on side, but sleeps on back. Psychiatric appears to make sound judgement; expressing concern regarading delay in bed and chair cushions, plans to discuss with home health. oriented to time, place, person and situation. calm, pleasant, conversive, talkative and interactive. Integumentary - right ischial pressure ulcer superficial at this appointment with pale granulation tissue; right ischial pressure ulcer continues to probe to peri-osteum/bone with sterile cotton tip applicator at 10 o'clock location, depth increasing,  macerated peri-wound; left sinus tract reamins, serosanguinous drainage with palpation, denies pain with palpation and manipulation Integumentary (Hair, Skin) Wound #3 status is Open. Original cause of wound was Pressure Injury. The wound is located on the Right Gluteal fold. The wound measures 1.5cm length x 0.9cm width x 7.5cm depth; 1.06cm^2 area and 7.952cm^3 volume. There is muscle, fat, and fascia exposed. There is no tunneling or undermining noted. There is a large amount of purulent drainage noted. The wound margin is distinct with the outline attached to the wound base. There is small (1-33%) pink, pale granulation within the wound bed. There is a large (67-100%) amount of necrotic tissue within the wound bed including Adherent Slough. The periwound skin appearance exhibited: Maceration, Moist. Periwound temperature was noted as No Abnormality. The periwound has tenderness on palpation. Wound #4 status is Open. Original cause of wound was Pressure Injury. The wound is located on the Left Gluteal fold. The wound measures 0.4cm length x 1.2cm width x 0.1cm depth; 0.377cm^2 area and 0.038cm^3 volume. There is muscle and fat exposed. There is no tunneling or undermining noted. There is a large amount of serous drainage noted. The wound margin is distinct with the outline attached to the wound base. There is large (67-100%) red, pink granulation within  the wound bed. There is a small (1-33%) amount of necrotic tissue within the wound bed including Adherent Slough. The periwound skin appearance exhibited: Maceration, Moist. Periwound temperature was noted as No Abnormality. The periwound has tenderness on palpation. Wound #5 status is Open. Original cause of wound was Gradually Appeared. The wound is located on the Left,Posterior Knee. The wound measures 0.3cm length x 0.3cm width x 0.5cm depth; 0.071cm^2 area and 0.035cm^3 volume. There is muscle and fat exposed. There is no undermining  noted, however, there is tunneling at 12:00 with a maximum distance of 15cm. There is a large amount of purulent drainage noted. The wound margin is flat and intact. There is large (67-100%) pink granulation within the wound bed. There is a small (1-33%) amount of necrotic tissue within the wound bed including Adherent Slough. The periwound skin appearance exhibited: Localized Edema, Maceration, Moist, Rubor, Erythema. The periwound skin appearance did not exhibit: Callus, Crepitus, Excoriation, Fluctuance, Friable, Induration, Rash, Scarring, Dry/Scaly, Atrophie Blanche, Cyanosis, Ecchymosis, Hemosiderin Staining, Mottled, Pallor. The surrounding wound skin color is noted with erythema which is circumferential. Periwound temperature was noted as Hot. The periwound has tenderness on palpation. Assessment Elk, Porshea L. 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 There was a Skin/Subcutaneous Tissue Debridement ([id number]) debridement with total area of 1.35 sq cm performed by Bonnell Public, NP. with the following instrument(s): Curette to remove Viable and Non-Viable tissue/material including Other and Subcutaneous after achieving pain control using Lidocaine 4% Topical Solution. A time out was conducted at 08:24, prior to the start of the procedure. There was no bleeding. The procedure was tolerated well with a pain level of 0 throughout and a pain level of 0 following the procedure. Post Debridement Measurements: 1.5cm length x 0.9cm width x 7.5cm depth; 7.952cm^3 volume. Post debridement Stage noted as Category/Stage  IV. Character of Wound/Ulcer Post Debridement requires further debridement. Severity of Tissue Post Debridement is: Fat layer exposed. Post procedure Diagnosis Wound #3: Same as Pre-Procedure Plan Wound Cleansing: Wound #3 Right Gluteal fold: Clean wound with Normal Saline. Cleanse wound with mild soap and water Wound #4 Left Gluteal fold: Clean wound with Normal Saline. Cleanse wound with mild soap and water Wound #5 Left,Posterior Knee: Clean wound with Normal Saline. Cleanse wound with mild soap and water Anesthetic: Wound #3 Right Gluteal fold: Topical Lidocaine 4% cream applied to wound bed prior to debridement - clinic use only Wound #4 Left Gluteal fold: [name], [name] ([id number]) Topical Lidocaine 4% cream applied to wound bed prior to debridement - clinic use only Wound #5 Left,Posterior Knee: Topical Lidocaine 4% cream applied to wound bed prior to debridement - clinic use only Skin Barriers/Peri-Wound Care: Wound #3 Right Gluteal fold: Skin Prep Antifungal cream - ketoconazole to around the wound areas Wound #4 Left Gluteal fold: Skin Prep Antifungal cream - ketoconazole to around the wound areas Wound #5 Left,Posterior Knee: Skin Prep Antifungal cream - ketoconazole to around the wound areas Primary Wound Dressing: Wound #3 Right Gluteal fold: Aquacel Ag - or equivalent to Silver Alginate Rope pack lightly into wound and leave a tail where it can be pulled out and to cover the  outside of the wound. Sorbalgon Ag - used in clinic Wound #4 Left Gluteal fold: Aquacel Ag - Silver Alginate Sorbalgon Ag - used in clinic Wound #5 Left,Posterior Knee: Iodoform packing Gauze - Pack lightly into wound at 12 o'clock and leave a tail where it can be pulled out and to cover the outside of the wound. Secondary Dressing: Wound #3 Right Gluteal fold: Dry Gauze Non-adherent pad - Telfa island, tape, or BFD Wound #4 Left Gluteal fold: Dry Gauze Non-adherent pad - Telfa  island, tape, or BFD Wound #5 Left,Posterior Knee: Dry Gauze Non-adherent pad - Telfa island, tape, or BFD Dressing Change Frequency: Wound #3 Right Gluteal fold: Change dressing every other day. Wound #4 Left Gluteal fold: Change dressing every other day. Wound #5 Left,Posterior Knee: Change dressing every other day. Follow-up Appointments: Wound #3 Right Gluteal fold: Return Appointment in 1 week. Wound #4 Left Gluteal fold: Return Appointment in 1 week. Wound #5 Left,Posterior Knee: Return Appointment in 1 week. Off-Loading: Wound #3 Right Gluteal fold: Roho cushion for wheelchair - *********[facility] to order*********** [name], [name] ([id number]) Turn and reposition every 2 hours - Air Mattress *********[facility] to order*********** Wound #4 Left Gluteal fold: Roho cushion for wheelchair - *********[facility] to order*********** Turn and reposition every 2 hours Mattress - Museum/gallery curator *********[facility] to order*********** Wound #5 Left,Posterior Knee: Roho cushion for wheelchair - *********[facility] to order*********** Turn and reposition every 2 hours Mattress - Museum/gallery curator *********[facility] to order*********** Home Health: Wound #3 Right Gluteal fold: Continue [facility] Visits - [facility] *****Please order ROHO cushion and air mattress****** Home Health Nurse may visit PRN to address patient s wound care needs. FACE TO FACE ENCOUNTER: MEDICARE and MEDICAID PATIENTS: I certify that this patient is under my care and that I had a face-to-face encounter that meets the physician face-to-face encounter requirements with this patient on this date. The encounter with the patient was in whole or in part for the following MEDICAL CONDITION: (primary reason for Home Healthcare) MEDICAL NECESSITY: I certify, that based on my findings, NURSING services are a medically necessary home health service. HOME BOUND STATUS: I certify  that my clinical findings support that this patient is homebound (i.e., Due to illness or injury, pt requires aid of supportive devices such as crutches, cane, wheelchairs, walkers, the use of special transportation or the assistance of another person to leave their place of residence. There is a normal inability to leave the home and doing so requires considerable and taxing effort. Other absences are for medical reasons / religious services and are infrequent or of short duration when for other reasons). If current dressing causes regression in wound condition, may D/C ordered dressing product/s and apply Normal Saline Moist Dressing daily until next Wound Healing Center / Other MD appointment. Notify Wound Healing Center of regression in wound condition at [phone]. Please direct any NON-WOUND related issues/requests for orders to patient's Primary Care Physician Wound #4 Left Gluteal fold: Continue [facility]its - [facility] *****Please order ROHO cushion and air mattress****** Home Health Nurse may visit PRN to address patient s wound care needs. FACE TO FACE ENCOUNTER: MEDICARE and MEDICAID PATIENTS: I certify that this patient is under my care and that I had a face-to-face encounter that meets the physician face-to-face encounter requirements with this patient on this date. The encounter with the patient was in whole or in part for the following  MEDICAL CONDITION: (primary reason for Home Healthcare) MEDICAL NECESSITY: I certify, that based on my findings, NURSING services are a medically necessary home health service. HOME BOUND STATUS: I certify that my clinical findings support that this patient is homebound (i.e., Due to illness or injury, pt requires aid of supportive devices such as crutches, cane, wheelchairs, walkers, the use of special transportation or the assistance of another person to leave their place of residence. There is a normal inability to leave  the home and doing so requires considerable and taxing effort. Other absences are for medical reasons / religious services and are infrequent or of short duration when for other reasons). If current dressing causes regression in wound condition, may D/C ordered dressing product/s and apply Normal Saline Moist Dressing daily until next Wound Healing Center / Other MD appointment. Notify Wound Healing Center of regression in wound condition at [phone]. Please direct any NON-WOUND related issues/requests for orders to patient's Primary Care Physician Wound #5 Left,Posterior Knee: Continue Home Health Visits - [facility] [place] *****Please order ROHO cushion and air mattress****** Home Health Nurse may visit PRN to address patient s wound care needs. FACE TO FACE ENCOUNTER: MEDICARE and MEDICAID PATIENTS: I certify that this patient is under [name], [name] ([id number]) my care and that I had a face-to-face encounter that meets the physician face-to-face encounter requirements with this patient on this date. The encounter with the patient was in whole or in part for the following MEDICAL CONDITION: (primary reason for Home Healthcare) MEDICAL NECESSITY: I certify, that based on my findings, NURSING services are a medically necessary home health service. HOME BOUND STATUS: I certify that my clinical findings support that this patient is homebound (i.e., Due to illness or injury, pt requires aid of supportive devices such as crutches, cane, wheelchairs, walkers, the use of special transportation or the assistance of another person to leave their place of residence. There is a normal inability to leave the home and doing so requires considerable and taxing effort. Other absences are for medical reasons / religious services and are infrequent or of short duration when for other reasons). If current dressing causes regression in wound condition, may D/C ordered dressing product/s and  apply Normal Saline Moist Dressing daily until next Wound Healing Center / Other MD appointment. Notify Wound Healing Center of regression in wound condition at [phone]. Please direct any NON-WOUND related issues/requests for orders to patient's Primary Care Physician Medications-please add to medication list.: Wound #3 Right Gluteal fold: Other: - Vitamin C, Zinc, Multivitamin ketoconazole Wound #4 Left Gluteal fold: Other: - Vitamin C, Zinc, Multivitamin ketoconazole Wound #5 Left,Posterior Knee: P.O. Antibiotics - Augmentin and Doxycycline Other: - Vitamin C, Zinc, Multivitamin ketoconazole Follow-Up Appointments: A follow-up appointment should be scheduled. Medication Reconciliation completed and provided to Patient/Care Provider. Electronic Signature(s) Signed: 11/04/2016 9:46:10 AM By: Penne Lash, NP, Galina Haddox Previous Signature: 11/03/2016 2:25:42 AM Version By: Bonnell Public Entered By: Penne Lash, NP, Somaly Marteney on 11/04/2016 09:46:10 [place], [name] ([id number]) -------------------------------------------------------------------------------- SuperBill Details Patient Name: [name] Date of Service: 11/02/2016 Medical Record Number: [id number] Patient Account Number: [id number] Date of Birth/Sex: 1961-08-31 (55 y.o. 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 L89.324 Pressure ulcer of left  buttock, stage 4 Facility Procedures CPT4 Code: 45364680 Description: 11042 - DEB SUBQ TISSUE 20 SQ CM/< ICD-10 Description Diagnosis L89.314 Pressure ulcer of right buttock, stage 4 E11.42 Type 2 diabetes mellitus with  diabetic polyneuropat Modifier: hy Quantity: 1 Physician Procedures CPT4 Code: 3212248 Description: 11042 - WC PHYS SUBQ TISS 20 SQ CM ICD-10 Description Diagnosis L89.314 Pressure ulcer of right buttock, stage 4 E11.42 Type 2 diabetes mellitus with diabetic polyneuropat Modifier: hy Quantity: 1 Electronic Signature(s) Signed: 11/03/2016 2:25:42 AM By: Bonnell Public Entered By: Bonnell Public on 11/02/2016 09:34:29

## 2016-11-03 NOTE — Progress Notes (Signed)
Katelyn, [name] ([id number]) Visit Report for 11/02/2016 Arrival Information Details Patient Name: [name], [name] Date of Service: 11/02/2016 8:00 AM Medical Record Number: [id number] Patient Account Number: [id number] Date of Birth/Sex: 01/29/1961 (55 y.o. Female) Treating RN: Carolyne Fiscal, Debi Primary Care Physician: Lavera Guise Other Clinician: Referring Physician: Lavera Guise Treating Physician/Extender: Cathie Olden in Treatment: 6 Visit Information History Since Last Visit All ordered tests and consults were completed: No Patient Arrived: Wheel Chair Added or deleted any medications: No Arrival Time: 08:00 Any new allergies or adverse reactions: No Accompanied By: self Had a fall or experienced change in No Transfer Assistance: [employer] activities of daily living that may affect Patient Lift risk of falls: Patient Identification Verified: Yes Signs or symptoms of abuse/neglect since last No Secondary Verification Process Yes visito Completed: Hospitalized since last visit: No Patient Requires Transmission- No Pain Present Now: Yes Based Precautions: Patient Has Alerts: Yes Patient Alerts: DM II Electronic Signature(s) Signed: 11/02/2016 5:51:11 PM By: Alric Quan Entered By: Alric Quan on 11/02/2016 09:00:13 Janelle, [name] ([id number]) -------------------------------------------------------------------------------- Encounter Discharge Information Details Patient Name: [name] Date of Service: 11/02/2016 8:00 AM Medical Record Number: [id number] Patient Account Number: [id number] Date of Birth/Sex: August 30, 1961 (55 y.o. Female) Treating RN: Carolyne Fiscal, Debi Primary Care Physician: Lavera Guise Other Clinician: Referring Physician: Lavera Guise Treating Physician/Extender: Cathie Olden in Treatment: 6 Encounter Discharge Information Items Discharge Pain Level: 0 Discharge Condition: Stable Ambulatory Status: Wheelchair Other  (Note Discharge Destination: Required) Transportation: Other Accompanied By: self Schedule Follow-up Appointment: Yes Medication Reconciliation completed and provided to Patient/Care Yes : Clinical Summary of Care: Electronic Signature(s) Signed: 11/02/2016 5:51:11 PM By: Alric Quan Entered By: Alric Quan on 11/02/2016 09:20:00 Prouse, [name] ([id number]) -------------------------------------------------------------------------------- Lower Extremity Assessment Details Patient Name: [name] Date of Service: 11/02/2016 8:00 AM Medical Record Number: [id number] Patient Account Number: [id number] Date of Birth/Sex: November 05, 1961 (55 y.o. Female) Treating RN: Ahmed Prima Primary Care Physician: Lavera Guise Other Clinician: Referring Physician: Lavera Guise Treating Physician/Extender: Cathie Olden in Treatment: 6 Electronic Signature(s) Signed: 11/02/2016 5:51:11 PM By: Alric Quan Entered By: Alric Quan on 11/02/2016 09:01:18 Thielman, [name] ([id number]) -------------------------------------------------------------------------------- Multi Wound Chart Details Patient Name: [name] Date of Service: 11/02/2016 8:00 AM Medical Record Number: [id number] Patient Account Number: [id number] Date of Birth/Sex: 1961-04-11 (55 y.o. Female) Treating RN: Carolyne Fiscal, Debi Primary Care Physician: Lavera Guise Other Clinician: Referring Physician: Lavera Guise Treating Physician/Extender: Cathie Olden in Treatment: 6 Vital Signs Height(in): 63 Pulse(bpm): 78 Weight(lbs): 257 Blood Pressure 134/60 (mmHg): Body Mass Index(BMI): 46 Temperature(F): 98.3 Respiratory Rate 18 (breaths/min): Photos: [3:No Photos] [4:No Photos] [5:No Photos] Wound Location: [3:Right Gluteal fold] [4:Left Gluteal fold] [5:Left Knee - Posterior] Wounding Event: [3:Pressure Injury] [4:Pressure Injury] [5:Gradually Appeared] Primary Etiology: [3:Pressure  Ulcer] [4:Pressure Ulcer] [5:Diabetic Wound/Ulcer of the Lower Extremity] Comorbid History: [3:Asthma, Hypertension, Type II Diabetes, Neuropathy] [4:Asthma, Hypertension, Type II Diabetes, Neuropathy] [5:Asthma, Hypertension, Type II Diabetes, Neuropathy] Date Acquired: [3:04/21/2016] [4:04/21/2016] [5:07/15/2016] Weeks of Treatment: [3:6] [4:6] [5:6] Wound Status: [3:Open] [4:Open] [5:Open] Measurements L x W x D 1.5x0.9x7.5 [4:0.4x1.2x0.1] [5:0.3x0.3x0.5] (cm) Area (cm) : [3:1.06] [4:0.377] [5:0.071] Volume (cm) : [3:7.952] [4:0.038] [5:0.035] % Reduction in Area: [3:65.50%] [4:95.10%] [5:85.70%] % Reduction in Volume: 53.80% [4:99.70%] [5:99.50%] Position 1 (o'clock): [5:12] Maximum Distance 1 [5:15] (cm): Tunneling: [3:No] [4:No] [5:Yes] Classification: [3:Category/Stage IV] [4:Category/Stage IV] [5:Grade 2] Exudate Amount: [3:Large] [4:Large] [5:Large] Exudate Type: [3:Purulent] [4:Serous] [5:Purulent] Exudate Color: [3:yellow, brown, green] [4:amber] [5:yellow, brown, green] Foul Odor  After [3:Yes] [4:Yes] [5:Yes] Cleansing: Odor Anticipated Due to No [4:No] [5:No] Product Use: Wound Margin: [3:Distinct, outline attached] [4:Distinct, outline attached Flat and Intact] Granulation Amount: Small (1-33%) Large (67-100%) Large (67-100%) Granulation Quality: Pink, Pale Red, Pink Pink Necrotic Amount: Large (67-100%) Small (1-33%) Small (1-33%) Exposed Structures: Fascia: Yes Fat: Yes Fat: Yes Fat: Yes Muscle: Yes Muscle: Yes Muscle: Yes Fascia: No Fascia: No Tendon: No Tendon: No Tendon: No Joint: No Joint: No Bone: No Bone: No Epithelialization: None None None Periwound Skin Texture: No Abnormalities Noted No Abnormalities Noted Edema: Yes Excoriation: No Induration: No Callus: No Crepitus: No Fluctuance: No Friable: No Rash: No Scarring: No Periwound Skin Maceration: Yes Maceration: Yes Maceration: Yes Moisture: Moist: Yes Moist: Yes Moist:  Yes Dry/Scaly: No Periwound Skin Color: No Abnormalities Noted No Abnormalities Noted Erythema: Yes Rubor: Yes Atrophie Blanche: No Cyanosis: No Ecchymosis: No Hemosiderin Staining: No Mottled: No Pallor: No Erythema Location: N/A N/A Circumferential Temperature: No Abnormality No Abnormality Hot Tenderness on Yes Yes Yes Palpation: Wound Preparation: Ulcer Cleansing: Ulcer Cleansing: Ulcer Cleansing: Rinsed/Irrigated with Rinsed/Irrigated with Rinsed/Irrigated with Saline Saline Saline Topical Anesthetic Topical Anesthetic Topical Anesthetic Applied: Other: lidocaine Applied: Other: lidocaine Applied: Other: lidocaine 4% 4% 4% Treatment Notes Electronic Signature(s) Signed: 11/02/2016 5:51:11 PM By: Alric Quan Entered By: Alric Quan on 11/02/2016 09:05:06 Mcmillen, [name] ([id number]) -------------------------------------------------------------------------------- Multi-Disciplinary Care Plan Details Patient Name: [name] Date of Service: 11/02/2016 8:00 AM Medical Record Number: [id number] Patient Account Number: [id number] Date of Birth/Sex: January 14, 1961 (55 y.o. Female) Treating RN: Carolyne Fiscal, Debi Primary Care Physician: Lavera Guise Other Clinician: Referring Physician: Lavera Guise Treating Physician/Extender: Cathie Olden in Treatment: 6 Active Inactive Abuse / Safety / Falls / Self Care Management Nursing Diagnoses: Potential for falls Goals: Patient will remain injury free Date Initiated: 09/21/2016 Goal Status: Active Interventions: Assess fall risk on admission and as needed Notes: Nutrition Nursing Diagnoses: Imbalanced nutrition Potential for alteratiion in Nutrition/Potential for imbalanced nutrition Goals: Patient/caregiver agrees to and verbalizes understanding of need to use nutritional supplements and/or vitamins as prescribed Date Initiated: 09/21/2016 Goal Status: Active Patient/caregiver verbalizes understanding of need  to maintain therapeutic glucose control per primary care physician Date Initiated: 09/21/2016 Goal Status: Active Interventions: Assess patient nutrition upon admission and as needed per policy Notes: Orientation to the [place], SAORI UMHOLTZ ([id number]) Nursing Diagnoses: Knowledge deficit related to the wound healing center program Goals: Patient/caregiver will verbalize understanding of the [place] Program Date Initiated: 09/21/2016 Goal Status: Active Interventions: Provide education on orientation to the wound center Notes: Pain, Acute or Chronic Nursing Diagnoses: Pain, acute or chronic: actual or potential Potential alteration in comfort, pain Goals: Patient will verbalize adequate pain control and receive pain control interventions during procedures as needed Date Initiated: 09/21/2016 Goal Status: Active Patient/caregiver will verbalize adequate pain control between visits Date Initiated: 09/21/2016 Goal Status: Active Patient/caregiver will verbalize comfort level met Date Initiated: 09/21/2016 Goal Status: Active Interventions: Assess comfort goal upon admission Complete pain assessment as per visit requirements Notes: Wound/Skin Impairment Nursing Diagnoses: Impaired tissue integrity Goals: Ulcer/skin breakdown will have a volume reduction of 30% by week 4 Date Initiated: 09/21/2016 Goal Status: Active Capito, Dorothey L. ([id number]) Ulcer/skin breakdown will have a volume reduction of 50% by week 8 Date Initiated: 09/21/2016 Goal Status: Active Ulcer/skin breakdown will have a volume reduction of 80% by week 12 Date Initiated: 09/21/2016 Goal Status: Active Interventions: Assess ulceration(s) every visit Notes: Electronic Signature(s) Signed: 11/02/2016 5:51:11 PM By:  Alric Quan Entered By: Alric Quan on 11/02/2016 09:04:25 Luthi, [name]  ([id number]) -------------------------------------------------------------------------------- Pain Assessment Details Patient Name: SAISHA, HOGUE Date of Service: 11/02/2016 8:00 AM Medical Record Number: [id number] Patient Account Number: [id number] Date of Birth/Sex: 04/30/1961 (55 y.o. Female) Treating RN: Carolyne Fiscal, Debi Primary Care Physician: Lavera Guise Other Clinician: Referring Physician: Lavera Guise Treating Physician/Extender: Cathie Olden in Treatment: 6 Active Problems Location of Pain Severity and Description of Pain Patient Has Paino Yes Site Locations Pain Location: Pain in Ulcers With Dressing Change: Yes Rate the pain. Current Pain Level: 6 Character of Pain Describe the Pain: Aching, Burning, Tender, Throbbing Pain Management and Medication Current Pain Management: Electronic Signature(s) Signed: 11/02/2016 5:51:11 PM By: Alric Quan Entered By: Alric Quan on 11/02/2016 09:00:38 Kindel, [name] ([id number]) -------------------------------------------------------------------------------- Patient/Caregiver Education Details Patient Name: [name] Date of Service: 11/02/2016 8:00 AM Medical Record Number: [id number] Patient Account Number: [id number] Date of Birth/Gender: 1961-05-30 (55 y.o. Female) Treating RN: Carolyne Fiscal, Debi Primary Care Physician: Lavera Guise Other Clinician: Referring Physician: Lavera Guise Treating Physician/Extender: Cathie Olden in Treatment: 6 Education Assessment Education Provided To: Patient Education Topics Provided Wound/Skin Impairment: Handouts: Other: change dressing as ordered Methods: Demonstration, Explain/Verbal Responses: State content correctly Electronic Signature(s) Signed: 11/02/2016 5:51:11 PM By: Alric Quan Entered By: Alric Quan on 11/02/2016 09:20:11 Fryberger, [name] ([id number]) -------------------------------------------------------------------------------- Wound  Assessment Details Patient Name: [name] Date of Service: 11/02/2016 8:00 AM Medical Record Number: [id number] Patient Account Number: [id number] Date of Birth/Sex: Nov 30, 1961 (55 y.o. Female) Treating RN: Carolyne Fiscal, Debi Primary Care Physician: Lavera Guise Other Clinician: Referring Physician: Lavera Guise Treating Physician/Extender: Cathie Olden in Treatment: 6 Wound Status Wound Number: 3 Primary Pressure Ulcer Etiology: Wound Location: Right Gluteal fold Wound Status: Open Wounding Event: Pressure Injury Comorbid Asthma, Hypertension, Type II Date Acquired: 04/21/2016 History: Diabetes, Neuropathy Weeks Of Treatment: 6 Clustered Wound: No Photos Photo Uploaded By: Alric Quan on 11/02/2016 09:45:48 Wound Measurements Length: (cm) 1.5 Width: (cm) 0.9 Depth: (cm) 7.5 Area: (cm) 1.06 Volume: (cm) 7.952 % Reduction in Area: 65.5% % Reduction in Volume: 53.8% Epithelialization: None Tunneling: No Undermining: No Wound Description Classification: Category/Stage IV Wound Margin: Distinct, outline attached Exudate Amount: Large Exudate Type: Purulent Exudate Color: yellow, brown, green Foul Odor After Cleansing: Yes Due to Product Use: No Wound Bed Granulation Amount: Small (1-33%) Exposed Structure Granulation Quality: Pink, Pale Fascia Exposed: Yes Necrotic Amount: Large (67-100%) Fat Layer Exposed: Yes Necrotic Quality: Adherent Slough Tendon Exposed: No Blessinger, Alanys L. ([id number]) Muscle Exposed: Yes Necrosis of Muscle: No Joint Exposed: No Bone Exposed: No Periwound Skin Texture Texture Color No Abnormalities Noted: No No Abnormalities Noted: No Moisture Temperature / Pain No Abnormalities Noted: No Temperature: No Abnormality Maceration: Yes Tenderness on Palpation: Yes Moist: Yes Wound Preparation Ulcer Cleansing: Rinsed/Irrigated with Saline Topical Anesthetic Applied: Other: lidocaine 4%, Treatment Notes Wound #3 (Right  Gluteal fold) 1. Cleansed with: Clean wound with Normal Saline Cleanse wound with antibacterial soap and water 2. Anesthetic Topical Lidocaine 4% cream to wound bed prior to debridement 3. Peri-wound Care: Skin Prep 4. Dressing Applied: Aquacel Ag 5. Secondary Dressing Applied Dry [place] Signature(s) Signed: 11/02/2016 5:51:11 PM By: Alric Quan Entered By: Alric Quan on 11/02/2016 09:02:13 Guardia, [name] ([id number]) -------------------------------------------------------------------------------- Wound Assessment Details Patient Name: [name] Date of Service: 11/02/2016 8:00 AM Medical Record Number: [id number] Patient Account Number: [id number] Date of Birth/Sex: November 25, 1961 (55 y.o. Female) Treating RN: Ahmed Prima Primary Care  Physician: Lavera Guise Other Clinician: Referring Physician: Lavera Guise Treating Physician/Extender: Cathie Olden in Treatment: 6 Wound Status Wound Number: 4 Primary Pressure Ulcer Etiology: Wound Location: Left Gluteal fold Wound Status: Open Wounding Event: Pressure Injury Comorbid Asthma, Hypertension, Type II Date Acquired: 04/21/2016 History: Diabetes, Neuropathy Weeks Of Treatment: 6 Clustered Wound: No Photos Photo Uploaded By: Alric Quan on 11/02/2016 09:45:49 Wound Measurements Length: (cm) 0.4 Width: (cm) 1.2 Depth: (cm) 0.1 Area: (cm) 0.377 Volume: (cm) 0.038 % Reduction in Area: 95.1% % Reduction in Volume: 99.7% Epithelialization: None Tunneling: No Undermining: No Wound Description Classification: Category/Stage IV Wound Margin: Distinct, outline attached Exudate Amount: Large Exudate Type: Serous Exudate Color: amber Foul Odor After Cleansing: Yes Due to Product Use: No Wound Bed Granulation Amount: Large (67-100%) Exposed Structure Granulation Quality: Red, Pink Fascia Exposed: No Necrotic Amount: Small (1-33%) Fat Layer Exposed: Yes Necrotic  Quality: Adherent Slough Tendon Exposed: No Sherrod, Makyra L. ([id number]) Muscle Exposed: Yes Necrosis of Muscle: No Periwound Skin Texture Texture Color No Abnormalities Noted: No No Abnormalities Noted: No Moisture Temperature / Pain No Abnormalities Noted: No Temperature: No Abnormality Maceration: Yes Tenderness on Palpation: Yes Moist: Yes Wound Preparation Ulcer Cleansing: Rinsed/Irrigated with Saline Topical Anesthetic Applied: Other: lidocaine 4%, Treatment Notes Wound #4 (Left Gluteal fold) 1. Cleansed with: Clean wound with Normal Saline Cleanse wound with antibacterial soap and water 2. Anesthetic Topical Lidocaine 4% cream to wound bed prior to debridement 3. Peri-wound Care: Skin Prep 4. Dressing Applied: Aquacel Ag 5. Secondary Dressing Applied Dry [place] Signature(s) Signed: 11/02/2016 5:51:11 PM By: Alric Quan Entered By: Alric Quan on 11/02/2016 09:03:02 Linenberger, [name] ([id number]) -------------------------------------------------------------------------------- Wound Assessment Details Patient Name: [name] Date of Service: 11/02/2016 8:00 AM Medical Record Number: [id number] Patient Account Number: [id number] Date of Birth/Sex: [birth year] (55 y.o. Female) Treating RN: Carolyne Fiscal, Debi Primary Care Physician: Lavera Guise Other Clinician: Referring Physician: Lavera Guise Treating Physician/Extender: Cathie Olden in Treatment: 6 Wound Status Wound Number: 5 Primary Diabetic Wound/Ulcer of the Lower Etiology: Extremity Wound Location: Left Knee - Posterior Wound Status: Open Wounding Event: Gradually Appeared Comorbid Asthma, Hypertension, Type II Date Acquired: 07/15/2016 History: Diabetes, Neuropathy Weeks Of Treatment: 6 Clustered Wound: No Photos Photo Uploaded By: Alric Quan on 11/02/2016 09:46:02 Wound Measurements Length: (cm) 0.3 Width: (cm) 0.3 Depth: (cm) 0.5 Area: (cm)  0.071 Volume: (cm) 0.035 % Reduction in Area: 85.7% % Reduction in Volume: 99.5% Epithelialization: None Tunneling: Yes Position (o'clock): 12 Maximum Distance: (cm) 15 Undermining: No Wound Description Classification: Grade 2 Wound Margin: Flat and Intact Exudate Amount: Large Exudate Type: Purulent Exudate Color: yellow, brown, green Foul Odor After Cleansing: Yes Due to Product Use: No Wound Bed Granulation Amount: Large (67-100%) Exposed Structure Laubacher, Zenita L. ([id number]) Granulation Quality: Pink Fascia Exposed: No Necrotic Amount: Small (1-33%) Fat Layer Exposed: Yes Necrotic Quality: Adherent Slough Tendon Exposed: No Muscle Exposed: Yes Necrosis of Muscle: No Joint Exposed: No Bone Exposed: No Periwound Skin Texture Texture Color No Abnormalities Noted: No No Abnormalities Noted: No Callus: No Atrophie Blanche: No Crepitus: No Cyanosis: No Excoriation: No Ecchymosis: No Fluctuance: No Erythema: Yes Friable: No Erythema Location: Circumferential Induration: No Hemosiderin Staining: No Localized Edema: Yes Mottled: No Rash: No Pallor: No Scarring: No Rubor: Yes Moisture Temperature / Pain No Abnormalities Noted: No Temperature: Hot Dry / Scaly: No Tenderness on Palpation: Yes Maceration: Yes Moist: Yes Wound Preparation Ulcer Cleansing: Rinsed/Irrigated with Saline Topical Anesthetic Applied: Other: lidocaine 4%, Treatment Notes  Wound #5 (Left, Posterior Knee) 1. Cleansed with: Clean wound with Normal Saline Cleanse wound with antibacterial soap and water 2. Anesthetic Topical Lidocaine 4% cream to wound bed prior to debridement 3. Peri-wound Care: Skin Prep 4. Dressing Applied: Iodoform packing Gauze 5. Secondary Dressing Applied Dry [place] Signature(s) Signed: 11/02/2016 5:51:11 PM By: Vivi Ferns, [place] ([id number]) Entered By: Alric Quan on 11/02/2016 09:03:45 Kuba, [name]  ([id number]) -------------------------------------------------------------------------------- [place] Details Patient Name: [name] Date of Service: 11/02/2016 8:00 AM Medical Record Number: [id number] Patient Account Number: [id number] Date of Birth/Sex: 02-17-1961 (55 y.o. Female) Treating RN: Carolyne Fiscal, Debi Primary Care Physician: Lavera Guise Other Clinician: Referring Physician: Lavera Guise Treating Physician/Extender: Cathie Olden in Treatment: 6 Vital Signs Time Taken: 08:05 Temperature (F): 98.3 Height (in): 63 Pulse (bpm): 78 Weight (lbs): 257 Respiratory Rate (breaths/min): 18 Body Mass Index (BMI): 45.5 Blood Pressure (mmHg): 134/60 Reference Range: 80 - 120 mg / dl Electronic Signature(s) Signed: 11/02/2016 5:51:11 PM By: Alric Quan Entered By: Alric Quan on 11/02/2016 09:01:08

## 2016-11-09 ENCOUNTER — Ambulatory Visit: Payer: Managed Care, Other (non HMO) | Admitting: Internal Medicine

## 2016-11-16 ENCOUNTER — Encounter: Payer: Managed Care, Other (non HMO) | Admitting: Nurse Practitioner

## 2016-11-16 DIAGNOSIS — L89323 Pressure ulcer of left buttock, stage 3: Secondary | ICD-10-CM | POA: Diagnosis not present

## 2016-11-22 NOTE — Progress Notes (Signed)
[name] ([id number]) Visit Report for 11/16/2016 Chief Complaint Document Details Patient Name: [name], [name] 11/16/2016 9:00 Date of Service: AM Medical Record [id number] Number: Patient Account Number: [id number] 1961-09-27 (55 y.o. Treating RN: Phillis Haggis Date of Birth/Sex: Female) Other Clinician: Primary Care Physician: Joen [name] Treating [name] Referring Physician: Joen [name] Physician/Extender: Tania Ade in Treatment: 8 Information Obtained from: Patient Chief Complaint Ms. Stencel presents today for routine managament of bilateral ischial pressure ulcers and sinus tract to left popliteal fossa. Electronic Signature(s) Signed: 11/16/2016 10:41:36 AM By: Penne Lash, NP, [name] By: Penne Lash, NP, [name] on 11/16/2016 10:41:36 Bieker, [name] ([id number]) -------------------------------------------------------------------------------- Debridement Details Patient Name: [name] 11/16/2016 9:00 Date of Service: AM Medical Record [id number] Number: Patient Account Number: [id number] [birth year] (55 y.o. Treating RN: Phillis Haggis Date of Birth/Sex: Female) Other Clinician: Primary Care Physician: Joen [name] Treating [name] Referring Physician: Joen [name] Physician/Extender: Tania Ade in Treatment: 8 Debridement Performed for Wound #3 Right Gluteal fold Assessment: Performed By: Physician Bonnell Public, NP Debridement: Debridement Pre-procedure Yes - 09:52 Verification/Time Out Taken: Start Time: 09:53 Pain Control: Lidocaine 4% Topical Solution Level: Skin/Subcutaneous Tissue Total Area Debrided (L x 1 (cm) x 0.6 (cm) = 0.6 (cm) W): Tissue and other Viable, Non-Viable, Exudate, Fibrin/Slough, Subcutaneous material debrided: Instrument: Curette Bleeding: Minimum Hemostasis Achieved: Pressure End Time: 09:56 Procedural Pain: 0 Post Procedural Pain: 0 Response to Treatment: Procedure was tolerated well Post Debridement Measurements  of Total Wound Length: (cm) 1 Stage: Category/Stage IV Width: (cm) 0.6 Depth: (cm) 5.5 Volume: (cm) 2.592 Character of Wound/Ulcer Post Requires Further Debridement: Debridement Severity of Tissue Post Fat layer exposed Debridement: Post Procedure Diagnosis Same as Pre-procedure Electronic Signature(s) Signed: 11/16/2016 10:41:21 AM By: Penne Lash, NP, [name], [name] ([id number]) Signed: 11/21/2016 4:53:13 PM By: Alejandro Mulling Entered By: Penne Lash, NP, Perrin Gens on 11/16/2016 10:41:21 Gloeckner, [name] ([id number]) -------------------------------------------------------------------------------- HPI Details Patient Name: [name], [name] 11/16/2016 9:00 Date of Service: AM Medical Record [id number] Number: Patient Account Number: [id number] September 10, 1961 (55 y.o. Treating RN: Phillis Haggis Date of Birth/Sex: Female) Other Clinician: Primary Care Physician: Joen [name] Treating [name] Referring Physician: Joen [name] Physician/Extender: Tania Ade in Treatment: 8 History of Present Illness Location: bilateral gluteal ulcerations and left popliteal ulceration with tunneling Quality: adits to intermittent aching to ischial ulcers, no discomfort to sinus tract Severity: right iscial ulcer with increased depth Duration: chronic ulcers to bilateral ischial ulcers and sinus tract Timing: The pain is intermittent in severity as far as how intense it becomes but is present all the time. Manipulationn makes this worse. Context: The wound occurred when the patient had a fall and was unconscious for about 48 hours laying on the floor and she had pressure injury at that stage. Modifying Factors: Other treatment(s) tried include:as noted below she has been seen by visiting wound care physicians or nurse practitioners and details have been noted Associated Signs and Symptoms: has yet to receive offloading cushions and mattress overlay HPI Description: 55 year old patient who was seen by  visiting [name] wound care specialist for a wound on both her buttock and was found to have an unstageable wound on the right buttock for about 2 months. I understand that she had a fall and was laying on the floor for about 48 hours before she was found and taken to the ICU and had a long injury to her gluteal area from pressure and also had broken her right humerus. She has had a right proximal humerus fracture and has been  followed up with orthopedics recently. The patient has a past medical history of type 2 diabetes mellitus, paraparesis, acute pyelonephritis, GERD, hypertension, glaucoma, chronic pain, anxiety neurosis, nicotine dependence, COPD. the patient had some debridement done and was to operative was recommended to use Silvadene dressing and offloading. She is a smoker and occasionally smokes a few cigarettes. the patient requested a second opinion for months and is here to discuss her care. 09/21/16; the patient re-presents from home today for review of 3 different wounds. I note that she was seen in the clinic here in July at which time she had bilateral buttock wounds. It was apparently suggested at that time that she use a wound VAC bridged to both wounds just near the initial tuberosity's bilaterally which she refused. The history was a bit difficult to put together. Apparently this patient became ill at the end of April of this year. She was found sitting on the floor she had apparently been for 2 days and subsequently admitted to hospital from 04/23/16 through 05/02/16 and at that point she was critically ill ultimately having sepsis secondary to UTI, nontraumatic rhabdomyolysis and diabetic ketoacidosis. She had acute renal failure and I think required ICU care including intubation. Patient states her wounds actually started at that point on the bilateral issue tuberosities however in reviewing the discharge summary from 5/8 I see no reference to wounds at that point. It did  state that she had left lower extremity cellulitis however. Reviewing Epic I see no relevant x-rays. It would appear that her discharge creatinine was within the normal range and indeed on 9/15 her creatinine has remained normal. [facility] [name], [name]. ([id number]) rehabilitation. There the wounds on her bilateral Botox were dressed. Only just before her discharge from the nursing facility she developed an "knot" which was interpreted as cellulitis on the posterior aspect of her left knee she was given antibiotics. Apparently sometime late in July a this actually opened and became a wound at home health care was tending to however she is still having purulent drainage coming from this and by my understanding the wound depth is actually become unmeasurable. I am not really clear about what home health has been placing in any of these wound areas. The patient states that is something with silver and it. She is not been systemically unwell no fever or chills her appetite is good. She is a diabetic poorly controlled however she states that her recent blood sugars at home have been in the low to mid 100s. 09/28/16 On evaluation today patient appears to continue to exhibit the 3 areas of ulceration that were noted previous. She did have an x-ray of the right pelvis which showed evidence of potential soft tissue infection but no obvious osteomyelitis. There was a discussion last office visit concerning the possibility of a wound VAC. Witth that being said the x-ray report suggested that an MRI may be more appropriate to further evaluate the extent. Subsequently in regard to the wound over the popliteal portion of the left lower extremity with tunneling at 12:00 the CT scan that was ordered was denied by insurance as they state the patient has not had x-rays prior to advanced imaging. Patient states that she is frustrated with the situation overall. 10/05/16  in the interval since I last saw this patient last week she has had the x-ray of the knee performed. I did review that x-ray today and fortunately shows no evidence of  osteomyelitis or other acute abnormality at this point in time. She continues to have the opening iin the posterior oral popliteal space with tracking proximal up the posterior thigh. Nothing seems to have worsened but it also seems to have not improved. The same is true in regard to the right pressure ulcer over the gluteal region which extends toward the ischium. The left gluteal pressure ulcer actually appears to be doing somewhat better my opinion there is some necrotic slough but overall this appears fairly well. She tells me thatt she has some discomfort especially when home health is helping her with dressing changes as they do not know her. At worse she rates her pain to be a 5 out of 10 right now it's more like a 1 out of 10. 10/07/16; still the patient has 3 different wound areas. She has a deep stage IV wound over her right ischial tuberosity. She is due to have an MRI next week. The wound over her left ischial tuberosity is more superficial and underwent debridement today. Finally she has a small open area in her left popliteal fossa the probes on measurably forward superiorly. Still a lot of drainage coming out of this. The CT scan that I ordered 3 weeks ago was questioned by her insurance company wanting a plain x-ray first. As I understand things result of this is nothing has been done in 3 weeks in terms of imaging the thigh and she has an MRI booked of this along with her pelvis for next week line 10/12/16; patient has a deep probing wound over the right ischiall tuberosity, stage III wound over the left visual tuberosity and a draining sinus in her left popliteal fossa. None of this much different from when I saw this 3 weeks ago. We have been using silver rope to the right ischial wound and a draining area in the  left popliteal fossa. Plain silver alginate to the area on the left ischial tuberosity 10/19/16; the patient's wounds are essentially unchanged although the area on the left lower gluteal is actually improved. Our intake nurse noted drainage from the right initial tuberosity probing wound as well as the draining area in the left popliteal fossa. Both of these were cultured. She had x-rays I think at the insistence of her insurance company on 09/23/16 x-ray of the pelvis was not particularly helpful she did have soft tissue air over the right lower pelvis although with the depth of this wound this is not surprising. An x-ray of her left knee did not show any specific abnormalities. We are still using silver alginate to these wound areas. Her MRI is booked for 10/27 10/26/16; cultures of the purulent drainage in her right initial tuberosity wound grew moderate Proteus and few staph aureus. The same organisms were cultured out of the left knee sinus tract posteriorly. The staph aureus is MRSA. I had started her on Augmentin last week I added doxycycline. The MRI of the left lower extremity and pelvis was finally done. The MRI of the femur showed subcutaneous soft tissue swelling Bricco, Louiza L. ([id number]) edema fluid and myositis in the vastus lateralis muscle but no soft tissue abscess septic arthritis or osteomyelitis. MRI of the pelvis showed the left wound to be more expensive extending down to the bone there was osteomyelitis. Left hamstring tendons were also involved. No septic arthritis involving the hip. The decubitus ulcer on the right side showed no definite osteomyelitis or abscess.. The right hip wound is actually the one the  probes 6 cm downward. But the MRI showing infection including osteomyelitis on the left explains the draining sinus in the popliteal fossa on the left. She did have antibiotics in the hospitalization last time and this extended into her nursing home stay but I'm not  exactly sure what antibiotics and for what duration. According the patient this did include vancomycin with considerable effort of our staff we are able to get the patient into see Dr. Sampson Goon today. There were transportation difficulties. Her mother had open heart surgery and is in the ICU in [place] therefore her brother was unable to transport. Dr. Jarrett Ables office graciously arranged time to see her today. From my point of view she is going to require IV vancomycin plus perhaps a third generation cephalosporin. I plan to keep her on doxycycline and Augmentin until the IV antibiotics can be arranged. 11/02/2016 - [name] presents today for management of ulcers; She saw Dr. 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 She is to have the PICC line placed today and receive the initial dose for both antibiotics today. She has yet to receive the offloading chair cushion and/or mattress overlay from home health, apparently this has been a 3 week process. I encouraged her to speak to home health regarding this matter, along with offering home health to contact the wouns care center with any questions or concerns. The left ischial pressure ulcer continues to imporve, while to right ischial ulcer has increased in depth. The popliteal fossa sinus tract remains unmeasurable due to the limitation of depth measurement (tract extends beyond our measuring devices). 11-16-2016 Ms. Doren presents today for evaluation and management of bilateral ischial stage IV pressure ulcers and sinus tract to the left popliteal fossa. she is under the care of Dr. Sampson Goon for IV antibiotic therapy; she states that the vancomycin was placed on hold and will be restarted at a lower dose based on her renal function. She continues taking Zosyn in addition to the vancomycin. She also states that she has yet to receive offloading  cushions from home health, according to her she does not qualify for these offloading cushions because "the ulcers are unstageable ". We will contact the home health agency today to lend clarity regarding her pressure ulcers. The left popliteal fossa sinus tract continues to be a measurable as it extends beyond the length of our measuring devices. Electronic Signature(s) Signed: 11/16/2016 10:44:38 AM By: Penne Lash, NP, [name] By: Penne Lash, NP, Brenner Visconti on 11/16/2016 10:44:38 [place], [name] ([id number]) -------------------------------------------------------------------------------- Physical Exam Details Patient Name: SHERRIAN, NUNNELLEY 11/16/2016 9:00 Date of Service: AM Medical Record [id number] Number: Patient Account Number: [id number] 07-30-1961 (55 y.o. Treating RN: Phillis Haggis Date of Birth/Sex: Female) Other Clinician: Primary Care Physician: Joen [name] Treating Keiden Deskin Referring Physician: Joen [name] Physician/Extender: Weeks in Treatment: 8 Constitutional afebrile. well nourished; well developed; appears stated age;Marland Kitchen Respiratory non-labored respiratory effort. Musculoskeletal arrives in wheelchair able to transfer herself to stretcher. Integumentary (Hair, Skin) left issue and pressure ulcer nearly healed at this time with a small area of superficial opening; right initial pressure ulcer continues to track deeply although improving, no palpable bone or periosteum noted today, periwound is macerated, unable to get an accurate assessment of ulcer base due to wound size; left popliteal fossa sinus tract continues to be unmeasurable due to it measuring longer than our measuring devices, unable to adequately visualize and/or assess the wound in its entirety, unable to  express any drainage. Psychiatric oriented to time, place, person and situation. calm, pleasant, conversive. Notes I really discussion with Ms. Ogletree today regarding concern over left popliteal fossa  sinus tract prematurely closing and potentially leading to abscess or seroma formation. We discussed the possibility for surgical intervention, that will be discussed and a plan will be made in conjunction with Dr. Leanord Hawking. A brief discussion was also had regarding the potential of HBO therapy and the presence of osteomyelitis. Electronic Signature(s) Signed: 11/16/2016 11:33:35 AM By: Penne Lash, NP, [name] By: Penne Lash, NP, Kareen Hitsman on 11/16/2016 11:33:34 Pile, [name] ([id number]) -------------------------------------------------------------------------------- Physician Orders Details Patient Name: [name], [name] 11/16/2016 9:00 Date of Service: AM Medical Record [id number] Number: Patient Account Number: [id number] Jul 20, 1961 (55 y.o. Treating RN: Phillis Haggis Date of Birth/Sex: Female) Other Clinician: Primary Care Physician: Joen [name] Treating Tokiko Diefenderfer Referring Physician: Joen [name] Physician/Extender: Tania Ade in Treatment: 8 Verbal / Phone Orders: Yes ClinicianAshok Cordia, Debi Read Back and Verified: Yes Diagnosis Coding Wound Cleansing Wound #3 Right Gluteal fold o Clean wound with Normal Saline. o Cleanse wound with mild soap and water Wound #4 Left Gluteal fold o Clean wound with Normal Saline. o Cleanse wound with mild soap and water Wound #5 Left,Posterior Knee o Clean wound with Normal Saline. o Cleanse wound with mild soap and water Anesthetic Wound #3 Right Gluteal fold o Topical Lidocaine 4% cream applied to wound bed prior to debridement - clinic use only Wound #4 Left Gluteal fold o Topical Lidocaine 4% cream applied to wound bed prior to debridement - clinic use only Wound #5 Left,Posterior Knee o Topical Lidocaine 4% cream applied to wound bed prior to debridement - clinic use only Skin Barriers/Peri-Wound Care Wound #3 Right Gluteal fold o Skin Prep Wound #4 Left Gluteal fold o Skin Prep Wound #5 Left,Posterior  Knee o Skin Prep Primary Wound Dressing Wound #3 Right Gluteal fold [name], [name] ([id number]) o Aquacel Ag - or equivalent to Silver Alginate Rope pack lightly into wound and leave a tail where it can be pulled out and to cover the outside of the wound. Wound #4 Left Gluteal fold o Aquacel Ag - Silver Alginate Wound #5 Left,Posterior Knee o Iodoform packing Gauze - Pack lightly into wound at 12 o'clock and leave a tail where it can be pulled out and to cover the outside of the wound, pack in to wound at as far as you can to keep the tunnel open. Secondary Dressing Wound #3 Right Gluteal fold o Dry Gauze o Non-adherent pad - Telfa island, tape, or BFD Wound #4 Left Gluteal fold o Dry Gauze o Non-adherent pad - Telfa island, tape, or BFD Wound #5 Left,Posterior Knee o Dry Gauze o Non-adherent pad - Telfa island, tape, or BFD Dressing Change Frequency Wound #3 Right Gluteal fold o Change dressing every other day. Wound #4 Left Gluteal fold o Change dressing every other day. Wound #5 Left,Posterior Knee o Change dressing every other day. Follow-up Appointments Wound #3 Right Gluteal fold o Return Appointment in 1 week. Wound #4 Left Gluteal fold o Return Appointment in 1 week. Wound #5 Left,Posterior Knee o Return Appointment in 1 week. Off-Loading Wound #3 Right Gluteal fold o Roho cushion for wheelchair - *********[facility] to order*********** [place], [name] ([id number]) o Turn and reposition every 2 hours - Air Mattress *********[facility] to order*********** Wound #4 Left Gluteal fold o Roho cushion for wheelchair - *********[facility] to order*********** o Mattress - Museum/gallery curator *********  [facility] to order*********** o Turn and reposition every 2 hours Wound #5 Left,Posterior Knee o Roho cushion for wheelchair - *********[facility] to order*********** o Mattress - Retail banker *********[facility] to order*********** o Turn and reposition every 2 hours Home Health Wound #3 Right Gluteal fold o Continue [facility] Health Visits - [facility] *****Please order ROHO cushion and air mattress****** o Home Health Nurse may visit PRN to address patientos wound care needs. o FACE TO FACE ENCOUNTER: MEDICARE and MEDICAID PATIENTS: I certify that this patient is under my care and that I had a face-to-face encounter that meets the physician face-to-face encounter requirements with this patient on this date. The encounter with the patient was in whole or in part for the following MEDICAL CONDITION: (primary reason for Home Healthcare) MEDICAL NECESSITY: I certify, that based on my findings, NURSING services are a medically necessary home health service. HOME BOUND STATUS: I certify that my clinical findings support that this patient is homebound (i.e., Due to illness or injury, pt requires aid of supportive devices such as crutches, cane, wheelchairs, walkers, the use of special transportation or the assistance of another person to leave their place of residence. There is a normal inability to leave the home and doing so requires considerable and taxing effort. Other absences are for medical reasons / religious services and are infrequent or of short duration when for other reasons). o If current dressing causes regression in wound condition, may D/C ordered dressing product/s and apply Normal Saline Moist Dressing daily until next Wound Healing Center / Other MD appointment. Notify Wound Healing Center of regression in wound condition at [phone]. o Please direct any NON-WOUND related issues/requests for orders to patient's Primary Care Physician Wound #4 Left Gluteal fold o Continue [facility]its - [facility] *****Please order ROHO cushion and air mattress****** o Home Health Nurse may visit PRN to address  patientos wound care needs. o FACE TO FACE ENCOUNTER: MEDICARE and MEDICAID PATIENTS: I certify that this patient is under my care and that I had a face-to-face encounter that meets the physician face-to-face encounter requirements with this patient on this date. The encounter with the patient was in whole or in part for the following MEDICAL CONDITION: (primary reason for Home Healthcare) MEDICAL NECESSITY: I certify, that based on my findings, NURSING services are a medically necessary home health service. HOME BOUND STATUS: I certify that my clinical findings support that this patient is homebound (i.e., Due to illness or injury, pt requires aid of Dymek, Amonda L. ([id number]) supportive devices such as crutches, cane, wheelchairs, walkers, the use of special transportation or the assistance of another person to leave their place of residence. There is a normal inability to leave the home and doing so requires considerable and taxing effort. Other absences are for medical reasons / religious services and are infrequent or of short duration when for other reasons). o If current dressing causes regression in wound condition, may D/C ordered dressing product/s and apply Normal Saline Moist Dressing daily until next Wound Healing Center / Other MD appointment. Notify Wound Healing Center of regression in wound condition at [phone]. o Please direct any NON-WOUND related issues/requests for orders to patient's Primary Care Physician Wound #5 Left,Posterior Knee o Continue [facility] Visits - [facility] *****Please order ROHO cushion and air mattress****** o Home Health Nurse may visit PRN to address patientos wound care needs. o FACE TO FACE ENCOUNTER: MEDICARE  and MEDICAID PATIENTS: I certify that this patient is under my care and that I had a face-to-face encounter that meets the physician face-to-face encounter requirements with this patient on this date. The  encounter with the patient was in whole or in part for the following MEDICAL CONDITION: (primary reason for Home Healthcare) MEDICAL NECESSITY: I certify, that based on my findings, NURSING services are a medically necessary home health service. HOME BOUND STATUS: I certify that my clinical findings support that this patient is homebound (i.e., Due to illness or injury, pt requires aid of supportive devices such as crutches, cane, wheelchairs, walkers, the use of special transportation or the assistance of another person to leave their place of residence. There is a normal inability to leave the home and doing so requires considerable and taxing effort. Other absences are for medical reasons / religious services and are infrequent or of short duration when for other reasons). o If current dressing causes regression in wound condition, may D/C ordered dressing product/s and apply Normal Saline Moist Dressing daily until next Wound Healing Center / Other MD appointment. Notify Wound Healing Center of regression in wound condition at [phone]. o Please direct any NON-WOUND related issues/requests for orders to patient's Primary Care Physician Medications-please add to medication list. Wound #3 Right Gluteal fold o Other: - Vitamin C, Zinc, Multivitamin Wound #4 Left Gluteal fold o Other: - Vitamin C, Zinc, Multivitamin o Other: - Vitamin C, Zinc, Multivitamin IV Antibiotics Wound #5 Left,Posterior Knee o Other: - Vitamin C, Zinc, Multivitamin IV Antibiotics Electronic Signature(s) Signed: 11/21/2016 4:53:13 PM By: Norris Cross, [name] ([id number]) Signed: 11/22/2016 9:26:16 AM By: Penne Lash, NP, Yenty Bloch Entered By: Alejandro Mulling on 11/16/2016 12:52:44 [name], [name] ([id number]) -------------------------------------------------------------------------------- Problem List Details Patient Name: [name], [name] 11/16/2016 9:00 Date of Service: AM Medical  Record [id number] Number: Patient Account Number: [id number] Dec 20, 1961 (55 y.o. 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/21/2016 Yes severity E11.42 Type 2 diabetes mellitus with diabetic polyneuropathy 09/21/2016 Yes Inactive Problems Resolved Problems ICD-10 Code Description Active Date Resolved Date L89.323 Pressure ulcer of left buttock, stage 3 09/21/2016 09/21/2016 Electronic Signature(s) Signed: 11/16/2016 10:41:04 AM By: Penne Lash, NP, Lakisha Peyser Entered By: Penne Lash, NP, Ryka Beighley on 11/16/2016 10:41:04 Condon, [name] ([id number]) -------------------------------------------------------------------------------- Progress Note Details Patient Name: [name], [name] 11/16/2016 9:00 Date of Service: AM Medical Record [id number] Number: Patient Account Number: [id number] December 08, 1961 (55 y.o. Treating RN: Phillis Haggis Date of Birth/Sex: Female) Other Clinician: Primary Care Physician: Joen [name] Treating Morton Simson Referring Physician: Joen [name] Physician/Extender: Tania Ade in Treatment: 8 Subjective Chief Complaint Information obtained from Patient Ms. Mells presents today for routine managament of bilateral ischial pressure ulcers and sinus tract to left popliteal fossa. History of Present Illness (HPI) The following HPI elements were documented for the patient's wound: Location: bilateral gluteal ulcerations and left popliteal ulceration with tunneling Quality: adits to intermittent aching  to ischial ulcers, no discomfort to sinus tract Severity: right iscial ulcer with increased depth Duration: chronic ulcers to bilateral ischial ulcers and sinus tract Timing: The pain is intermittent in severity as far as how intense  it becomes but is present all the time. Manipulationn makes this worse. Context: The wound occurred when the patient had a fall and was unconscious for about 48 hours laying on the floor and she had pressure injury at that stage. Modifying Factors: Other treatment(s) tried include:as noted below she has been seen by visiting wound care physicians or nurse practitioners and details have been noted Associated Signs and Symptoms: has yet to receive offloading cushions and mattress overlay 55 year old patient who was seen by visiting [name] wound care specialist for a wound on both her buttock and was found to have an unstageable wound on the right buttock for about 2 months. I understand that she had a fall and was laying on the floor for about 48 hours before she was found and taken to the ICU and had a long injury to her gluteal area from pressure and also had broken her right humerus. She has had a right proximal humerus fracture and has been followed up with orthopedics recently. The patient has a past medical history of type 2 diabetes mellitus, paraparesis, acute pyelonephritis, GERD, hypertension, glaucoma, chronic pain, anxiety neurosis, nicotine dependence, COPD. the patient had some debridement done and was to operative was recommended to use Silvadene dressing and offloading. She is a smoker and occasionally smokes a few cigarettes. the patient requested a second opinion for months and is here to discuss her care. 09/21/16; the patient re-presents from home today for review of 3 different wounds. I note that she was seen in the clinic here in July at which time she had bilateral buttock wounds. It was apparently suggested at that time that she use a wound  VAC bridged to both wounds just near the initial tuberosity's bilaterally which she refused. Edmister, Audriana L. ([id number]) The history was a bit difficult to put together. Apparently this patient became ill at the end of April of this year. She was found sitting on the floor she had apparently been for 2 days and subsequently admitted to hospital from 04/23/16 through 05/02/16 and at that point she was critically ill ultimately having sepsis secondary to UTI, nontraumatic rhabdomyolysis and diabetic ketoacidosis. She had acute renal failure and I think required ICU care including intubation. Patient states her wounds actually started at that point on the bilateral issue tuberosities however in reviewing the discharge summary from 5/8 I see no reference to wounds at that point. It did state that she had left lower extremity cellulitis however. Reviewing Epic I see no relevant x-rays. It would appear that her discharge creatinine was within the normal range and indeed on 9/15 her creatinine has remained normal. [facility] There the wounds on her bilateral Botox were dressed. Only just before her discharge from the nursing facility she developed an "knot" which was interpreted as cellulitis on the posterior aspect of her left knee she was given antibiotics. Apparently sometime late in July a this actually opened and became a wound at home health care was tending to however she is still having purulent drainage coming from this and by my understanding the wound depth is actually become unmeasurable. I am not really clear about what home health has been placing in any of these wound areas. The patient states that is something with silver and it. She is not been systemically unwell no fever or chills her appetite is good. She is a diabetic poorly controlled however she states that her recent blood sugars at home  have been in the low to  mid 100s. 09/28/16 On evaluation today patient appears to continue to exhibit the 3 areas of ulceration that were noted previous. She did have an x-ray of the right pelvis which showed evidence of potential soft tissue infection but no obvious osteomyelitis. There was a discussion last office visit concerning the possibility of a wound VAC. Witth that being said the x-ray report suggested that an MRI may be more appropriate to further evaluate the extent. Subsequently in regard to the wound over the popliteal portion of the left lower extremity with tunneling at 12:00 the CT scan that was ordered was denied by insurance as they state the patient has not had x-rays prior to advanced imaging. Patient states that she is frustrated with the situation overall. 10/05/16 in the interval since I last saw this patient last week she has had the x-ray of the knee performed. I did review that x-ray today and fortunately shows no evidence of osteomyelitis or other acute abnormality at this point in time. She continues to have the opening iin the posterior oral popliteal space with tracking proximal up the posterior thigh. Nothing seems to have worsened but it also seems to have not improved. The same is true in regard to the right pressure ulcer over the gluteal region which extends toward the ischium. The left gluteal pressure ulcer actually appears to be doing somewhat better my opinion there is some necrotic slough but overall this appears fairly well. She tells me thatt she has some discomfort especially when home health is helping her with dressing changes as they do not know her. At worse she rates her pain to be a 5 out of 10 right now it's more like a 1 out of 10. 10/07/16; still the patient has 3 different wound areas. She has a deep stage IV wound over her right ischial tuberosity. She is due to have an MRI next week. The wound over her left ischial tuberosity is more superficial and underwent  debridement today. Finally she has a small open area in her left popliteal fossa the probes on measurably forward superiorly. Still a lot of drainage coming out of this. The CT scan that I ordered 3 weeks ago was questioned by her insurance company wanting a plain x-ray first. As I understand things result of this is nothing has been done in 3 weeks in terms of imaging the thigh and she has an MRI booked of this along with her pelvis for next week line 10/12/16; patient has a deep probing wound over the right ischiall tuberosity, stage III wound over the left visual tuberosity and a draining sinus in her left popliteal fossa. None of this much different from when I saw this 3 weeks ago. We have been using silver rope to the right ischial wound and a draining area in the left popliteal fossa. Plain silver alginate to the area on the left ischial tuberosity 10/19/16; the patient's wounds are essentially unchanged although the area on the left lower gluteal is actually improved. Our intake nurse noted drainage from the right initial tuberosity probing wound as well as Karow, Laddie L. ([id number]) the draining area in the left popliteal fossa. Both of these were cultured. She had x-rays I think at the insistence of her insurance company on 09/23/16 x-ray of the pelvis was not particularly helpful she did have soft tissue air over the right lower pelvis although with the depth of this wound this is not surprising. An x-ray  of her left knee did not show any specific abnormalities. We are still using silver alginate to these wound areas. Her MRI is booked for 10/27 10/26/16; cultures of the purulent drainage in her right initial tuberosity wound grew moderate Proteus and few staph aureus. The same organisms were cultured out of the left knee sinus tract posteriorly. The staph aureus is MRSA. I had started her on Augmentin last week I added doxycycline. The MRI of the left lower extremity and pelvis was  finally done. The MRI of the femur showed subcutaneous soft tissue swelling edema fluid and myositis in the vastus lateralis muscle but no soft tissue abscess septic arthritis or osteomyelitis. MRI of the pelvis showed the left wound to be more expensive extending down to the bone there was osteomyelitis. Left hamstring tendons were also involved. No septic arthritis involving the hip. The decubitus ulcer on the right side showed no definite osteomyelitis or abscess.. The right hip wound is actually the one the probes 6 cm downward. But the MRI showing infection including osteomyelitis on the left explains the draining sinus in the popliteal fossa on the left. She did have antibiotics in the hospitalization last time and this extended into her nursing home stay but I'm not exactly sure what antibiotics and for what duration. According the patient this did include vancomycin with considerable effort of our staff we are able to get the patient into see Dr. Sampson Goon today. There were transportation difficulties. Her mother had open heart surgery and is in the ICU in [place] therefore her brother was unable to transport. Dr. Jarrett Ables office graciously arranged time to see her today. From my point of view she is going to require IV vancomycin plus perhaps a third generation cephalosporin. I plan to keep her on doxycycline and Augmentin until the IV antibiotics can be arranged. 11/02/2016 - Chivon presents today for management of ulcers; She saw Dr. 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 She is to have the PICC line placed today and receive the initial dose for both antibiotics today. She has yet to receive the offloading chair cushion and/or mattress overlay from home health, apparently this has been a 3 week process. I encouraged her to speak to home health regarding this matter, along with  offering home health to contact the wouns care center with any questions or concerns. The left ischial pressure ulcer continues to imporve, while to right ischial ulcer has increased in depth. The popliteal fossa sinus tract remains unmeasurable due to the limitation of depth measurement (tract extends beyond our measuring devices). 11-16-2016 Ms. Jonas presents today for evaluation and management of bilateral ischial stage IV pressure ulcers and sinus tract to the left popliteal fossa. she is under the care of Dr. Sampson Goon for IV antibiotic therapy; she states that the vancomycin was placed on hold and will be restarted at a lower dose based on her renal function. She continues taking Zosyn in addition to the vancomycin. She also states that she has yet to receive offloading cushions from home health, according to her she does not qualify for these offloading cushions because "the ulcers are unstageable ". We will contact the home health agency today to lend clarity regarding her pressure ulcers. The left popliteal fossa sinus tract continues to be a measurable as it extends beyond the length of our measuring devices. Objective [name], [name] ([id number]) Constitutional afebrile. well nourished; well developed; appears stated age;Marland Kitchen  Vitals Time Taken: 9:21 AM, Height: 63 in, Weight: 257 lbs, BMI: 45.5, Temperature: 97.7 F, Pulse: 79 bpm, Respiratory Rate: 18 breaths/min, Blood Pressure: 158/77 mmHg. Respiratory non-labored respiratory effort. Musculoskeletal arrives in wheelchair able to transfer herself to stretcher. Psychiatric oriented to time, place, person and situation. calm, pleasant, conversive. General Notes: I really discussion with Ms. Rodriguez today regarding concern over left popliteal fossa sinus tract prematurely closing and potentially leading to abscess or seroma formation. We discussed the possibility for surgical intervention, that will be discussed and a plan will be made  in conjunction with Dr. Leanord Hawking. A brief discussion was also had regarding the potential of HBO therapy and the presence of osteomyelitis. Integumentary (Hair, Skin) left issue and pressure ulcer nearly healed at this time with a small area of superficial opening; right initial pressure ulcer continues to track deeply although improving, no palpable bone or periosteum noted today, periwound is macerated, unable to get an accurate assessment of ulcer base due to wound size; left popliteal fossa sinus tract continues to be unmeasurable due to it measuring longer than our measuring devices, unable to adequately visualize and/or assess the wound in its entirety, unable to express any drainage. Wound #3 status is Open. Original cause of wound was Pressure Injury. The wound is located on the Right Gluteal fold. The wound measures 1cm length x 0.6cm width x 5.5cm depth; 0.471cm^2 area and 2.592cm^3 volume. There is muscle, fat, and fascia exposed. There is no tunneling or undermining noted. There is a large amount of purulent drainage noted. The wound margin is distinct with the outline attached to the wound base. There is small (1-33%) pink, pale granulation within the wound bed. There is a large (67-100%) amount of necrotic tissue within the wound bed including Adherent Slough. The periwound skin appearance exhibited: Maceration, Moist. Periwound temperature was noted as No Abnormality. The periwound has tenderness on palpation. Wound #4 status is Open. Original cause of wound was Pressure Injury. The wound is located on the Left Gluteal fold. The wound measures 0.3cm length x 0.6cm width x 0.1cm depth; 0.141cm^2 area and 0.014cm^3 volume. There is muscle and fat exposed. There is no tunneling or undermining noted. There is a large amount of serous drainage noted. The wound margin is distinct with the outline attached to the wound base. There is large (67-100%) red, pink granulation within the wound  bed. There is a small (1-33%) amount of necrotic tissue within the wound bed including Adherent Slough. The periwound skin appearance exhibited: Maceration, Moist. Periwound temperature was noted as No Abnormality. The periwound has tenderness on palpation. Wound #5 status is Open. Original cause of wound was Gradually Appeared. The wound is located on the [name], [name] ([id number]) Left,Posterior Knee. The wound measures 0.2cm length x 0.2cm width x 0.2cm depth; 0.031cm^2 area and 0.006cm^3 volume. There is muscle and fat exposed. There is no undermining noted, however, there is tunneling at 12:00 with a maximum distance of 15cm. There is a large amount of purulent drainage noted. The wound margin is flat and intact. There is large (67-100%) pink granulation within the wound bed. There is a small (1-33%) amount of necrotic tissue within the wound bed including Adherent Slough. The periwound skin appearance exhibited: Localized Edema, Maceration, Moist, Rubor, Erythema. The periwound skin appearance did not exhibit: Callus, Crepitus, Excoriation, Fluctuance, Friable, Induration, Rash, Scarring, Dry/Scaly, Atrophie Blanche, Cyanosis, Ecchymosis, Hemosiderin Staining, Mottled, Pallor. The surrounding wound skin color is noted with erythema which is circumferential. Periwound temperature  was noted as Hot. The periwound has tenderness on palpation. 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 There was a Skin/Subcutaneous Tissue Debridement ([id number]) debridement with total area of 0.6 sq cm performed by Bonnell Publicoulter, Marvelous Woolford, NP. with the  following instrument(s): Curette to remove Viable and Non-Viable tissue/material including Exudate, Fibrin/Slough, and Subcutaneous after achieving pain control using Lidocaine 4% Topical Solution. A time out was conducted at 09:52, prior to the start of the procedure. A Minimum amount of bleeding was controlled with Pressure. The procedure was tolerated well with a pain level of 0 throughout and a pain level of 0 following the procedure. Post Debridement Measurements: 1cm length x 0.6cm width x 5.5cm depth; 2.592cm^3 volume. Post debridement Stage noted as Category/Stage IV. Character of Wound/Ulcer Post Debridement requires further debridement. Severity of Tissue Post Debridement is: Fat layer exposed. Post procedure Diagnosis Wound #3: Same as Pre-Procedure Brunke, Jasdeep L. ([id number]) Plan Wound Cleansing: Wound #3 Right Gluteal fold: Clean wound with Normal Saline. Cleanse wound with mild soap and water Wound #4 Left Gluteal fold: Clean wound with Normal Saline. Cleanse wound with mild soap and water Wound #5 Left,Posterior Knee: Clean wound with Normal Saline. Cleanse wound with mild soap and water Anesthetic: Wound #3 Right Gluteal fold: Topical Lidocaine 4% cream applied to wound bed prior to debridement - clinic use only Wound #4 Left Gluteal fold: Topical Lidocaine 4% cream applied to wound bed prior to debridement - clinic use only Wound #5 Left,Posterior Knee: Topical Lidocaine 4% cream applied to wound bed prior to debridement - clinic use only Skin Barriers/Peri-Wound Care: Wound #3 Right Gluteal fold: Skin Prep Wound #4 Left Gluteal fold: Skin Prep Wound #5 Left,Posterior Knee: Skin Prep Primary Wound Dressing: Wound #3 Right Gluteal fold: Aquacel Ag - or equivalent to Silver Alginate Rope pack lightly into wound and leave a tail where it can be pulled out and to cover the outside of the wound. Wound #4 Left Gluteal fold: Aquacel Ag - Silver Alginate Wound #5  Left,Posterior Knee: Iodoform packing Gauze - Pack lightly into wound at 12 o'clock and leave a tail where it can be pulled out and to cover the outside of the wound, pack in to wound at as far as you can to keep the tunnel open. Secondary Dressing: Wound #3 Right Gluteal fold: Dry Gauze Non-adherent pad - Telfa island, tape, or BFD Wound #4 Left Gluteal fold: Dry Gauze Non-adherent pad - Telfa island, tape, or BFD Wound #5 Left,Posterior Knee: Dry Gauze Non-adherent pad - Telfa island, tape, or BFD Dressing Change Frequency: Wound #3 Right Gluteal fold: Change dressing every other day. Wound #4 Left Gluteal fold: Change dressing every other day. Faller, Suella L. ([id number]) Wound #5 Left,Posterior Knee: Change dressing every other day. Follow-up Appointments: Wound #3 Right Gluteal fold: Return Appointment in 1 week. Wound #4 Left Gluteal fold: Return Appointment in 1 week. Wound #5 Left,Posterior Knee: Return Appointment in 1 week. Off-Loading: Wound #3 Right Gluteal fold: Roho cushion for wheelchair - *********[facility] to order*********** Turn and reposition every 2 hours - Museum/gallery curatorAir Mattress *********[facility] to order*********** Wound #4 Left Gluteal fold: Roho cushion for wheelchair - *********  [facility] to order*********** Mattress - Museum/gallery curator *********[facility] to order*********** Turn and reposition every 2 hours Wound #5 Left,Posterior Knee: Roho cushion for wheelchair - *********[facility] to order*********** Mattress - Museum/gallery curator *********[facility] to order*********** Turn and reposition every 2 hours Home Health: Wound #3 Right Gluteal fold: Continue Home Health Visits - [facility] *****Please order ROHO cushion and air mattress****** Home Health Nurse may visit PRN to address patient s wound care needs. FACE TO FACE ENCOUNTER: MEDICARE and MEDICAID PATIENTS: I certify that this patient is under my care and that  I had a face-to-face encounter that meets the physician face-to-face encounter requirements with this patient on this date. The encounter with the patient was in whole or in part for the following MEDICAL CONDITION: (primary reason for Home Healthcare) MEDICAL NECESSITY: I certify, that based on my findings, NURSING services are a medically necessary home health service. HOME BOUND STATUS: I certify that my clinical findings support that this patient is homebound (i.e., Due to illness or injury, pt requires aid of supportive devices such as crutches, cane, wheelchairs, walkers, the use of special transportation or the assistance of another person to leave their place of residence. There is a normal inability to leave the home and doing so requires considerable and taxing effort. Other absences are for medical reasons / religious services and are infrequent or of short duration when for other reasons). If current dressing causes regression in wound condition, may D/C ordered dressing product/s and apply Normal Saline Moist Dressing daily until next Wound Healing Center / Other MD appointment. Notify Wound Healing Center of regression in wound condition at [phone]. Please direct any NON-WOUND related issues/requests for orders to patient's Primary Care Physician Wound #4 Left Gluteal fold: Continue Home Health Visits - [facility] [facility] *****Please order ROHO cushion and air mattress****** Home Health Nurse may visit PRN to address patient s wound care needs. FACE TO FACE ENCOUNTER: MEDICARE and MEDICAID PATIENTS: I certify that this patient is under my care and that I had a face-to-face encounter that meets the physician face-to-face encounter requirements with this patient on this date. The encounter with the patient was in whole or in part for the following MEDICAL CONDITION: (primary reason for Home Healthcare) MEDICAL NECESSITY: I certify, that based on my findings, NURSING  services are a medically necessary home health service. HOME BOUND STATUS: I certify that my clinical findings support that this patient is homebound (i.e., Due to illness or injury, pt requires aid of supportive devices such as crutches, cane, wheelchairs, walkers, the use Brosh, Adore L. ([id number]) of special transportation or the assistance of another person to leave their place of residence. There is a normal inability to leave the home and doing so requires considerable and taxing effort. Other absences are for medical reasons / religious services and are infrequent or of short duration when for other reasons). If current dressing causes regression in wound condition, may D/C ordered dressing product/s and apply Normal Saline Moist Dressing daily until next Wound Healing Center / Other MD appointment. Notify Wound Healing Center of regression in wound condition at [phone]. Please direct any NON-WOUND related issues/requests for orders to patient's Primary Care Physician Wound #5 Left,Posterior Knee: Continue Home Health Visits - [facility] *****Please order ROHO cushion and air mattress****** Home Health Nurse may visit PRN to address patient s wound care needs. FACE TO FACE ENCOUNTER: MEDICARE and MEDICAID PATIENTS: I certify  that this patient is under my care and that I had a face-to-face encounter that meets the physician face-to-face encounter requirements with this patient on this date. The encounter with the patient was in whole or in part for the following MEDICAL CONDITION: (primary reason for Home Healthcare) MEDICAL NECESSITY: I certify, that based on my findings, NURSING services are a medically necessary home health service. HOME BOUND STATUS: I certify that my clinical findings support that this patient is homebound (i.e., Due to illness or injury, pt requires aid of supportive devices such as crutches, cane, wheelchairs, walkers, the use of special  transportation or the assistance of another person to leave their place of residence. There is a normal inability to leave the home and doing so requires considerable and taxing effort. Other absences are for medical reasons / religious services and are infrequent or of short duration when for other reasons). If current dressing causes regression in wound condition, may D/C ordered dressing product/s and apply Normal Saline Moist Dressing daily until next Wound Healing Center / Other MD appointment. Notify Wound Healing Center of regression in wound condition at [phone]. Please direct any NON-WOUND related issues/requests for orders to patient's Primary Care Physician Medications-please add to medication list.: Wound #3 Right Gluteal fold: Other: - Vitamin C, Zinc, Multivitamin Wound #4 Left Gluteal fold: Other: - Vitamin C, Zinc, Multivitamin Other: - Vitamin C, Zinc, Multivitamin IV Antibiotics Wound #5 Left,Posterior Knee: Other: - Vitamin C, Zinc, Multivitamin IV Antibiotics Follow-Up Appointments: A follow-up appointment should be scheduled. Medication Reconciliation completed and provided to Patient/Care Provider. A Patient Clinical Summary of Care was provided to TW 1. Continue with IV antibiotic therapy per Dr. Jarrett Ables recommendations 2. Continue with quarter inch iodoform packing (cut in half to 1/8) to popliteal fossa Dalby, Soniya L. ([id number]) 3. Continue with silver alginate rope to the right ischial stage IV pressure ulcer 4. Follow-up with home health regarding offloading cushions that were ordered several weeks ago Electronic Signature(s) Signed: 11/21/2016 9:21:26 AM By: Penne Lash, NP, Hanae Waiters Previous Signature: 11/16/2016 11:35:44 AM Version By: Penne Lash, NP, Nathanial Arrighi Entered By: Penne Lash, NP, Keli Buehner on 11/21/2016 09:21:26 Pigman, [name] ([id number]) -------------------------------------------------------------------------------- SuperBill Details Patient Name: [name] Date of Service: 11/16/2016 Medical Record Number: [id number] Patient Account Number: [id number] Date of Birth/Sex: 08/10/1961 (55 y.o. 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

## 2016-11-22 NOTE — Progress Notes (Signed)
MALAN, WERK ([id number]) Visit Report for 11/16/2016 Arrival Information Details Patient Name: CEIL, RODERICK Date of Service: 11/16/2016 9:00 AM Medical Record Number: [id number] Patient Account Number: [id number] Date of Birth/Sex: 05-Dec-1961 (55 y.o. Female) Treating RN: Carolyne Fiscal, Debi Primary Care Physician: Lavera Guise Other Clinician: Referring Physician: Lavera Guise Treating Physician/Extender: Cathie Olden in Treatment: 8 Visit Information History Since Last Visit All ordered tests and consults were completed: No Patient Arrived: Wheel Chair Added or deleted any medications: No Arrival Time: 09:17 Any new allergies or adverse reactions: No Accompanied By: self Had a fall or experienced change in No Transfer Assistance: EasyP[employer] activities of daily living that may affect Patient Lift risk of falls: Patient Identification Verified: Yes Signs or symptoms of abuse/neglect since last No Secondary Verification Process Yes visito Completed: Hospitalized since last visit: No Patient Requires Transmission- No Pain Present Now: Yes Based Precautions: Patient Has Alerts: Yes Patient Alerts: DM II Electronic Signature(s) Signed: 11/21/2016 4:53:13 PM By: Alric Quan Entered By: Alric Quan on 11/16/2016 09:23:22 Helming, [place]. ([id number]) -------------------------------------------------------------------------------- Encounter Discharge Information Details Patient Name: [name] Date of Service: 11/16/2016 9:00 AM Medical Record Number: [id number] Patient Account Number: [id number] Date of Birth/Sex: 1961-01-23 (55 y.o. Female) Treating RN: Carolyne Fiscal, Debi Primary Care Physician: Lavera Guise Other Clinician: Referring Physician: Lavera Guise Treating Physician/Extender: Cathie Olden in Treatment: 8 Encounter Discharge Information Items Discharge Pain Level: 0 Discharge Condition: Stable Ambulatory Status: Wheelchair Discharge  Destination: Nursing Home Transportation: Private Auto Accompanied By: caregiver Schedule Follow-up Appointment: Yes Medication Reconciliation completed and provided to Patient/Care Yes Murrell Dome: Provided on Clinical Summary of Care: 11/16/2016 Form Type Recipient Paper Patient TW Electronic Signature(s) Signed: 11/21/2016 4:53:13 PM By: Alric Quan Previous Signature: 11/16/2016 10:22:09 AM Version By: Ruthine Dose Entered By: Alric Quan on 11/16/2016 11:42:17 Sowder, [name] ([id number]) -------------------------------------------------------------------------------- Lower Extremity Assessment Details Patient Name: [name] Date of Service: 11/16/2016 9:00 AM Medical Record Number: [id number] Patient Account Number: [id number] Date of Birth/Sex: 08-Aug-1961 (55 y.o. Female) Treating RN: Carolyne Fiscal, Debi Primary Care Physician: Lavera Guise Other Clinician: Referring Physician: Lavera Guise Treating Physician/Extender: Cathie Olden in Treatment: 8 Electronic Signature(s) Signed: 11/21/2016 4:53:13 PM By: Alric Quan Entered By: Alric Quan on 11/16/2016 09:23:08 Mandel, [place] ([id number]) -------------------------------------------------------------------------------- Multi Wound Chart Details Patient Name: [name] Date of Service: 11/16/2016 9:00 AM Medical Record Number: [id number] Patient Account Number: [id number] Date of Birth/Sex: 01-29-1961 (55 y.o. Female) Treating RN: Carolyne Fiscal, Debi Primary Care Physician: Lavera Guise Other Clinician: Referring Physician: Lavera Guise Treating Physician/Extender: Cathie Olden in Treatment: 8 Vital Signs Height(in): 63 Pulse(bpm): 79 Weight(lbs): 257 Blood Pressure 158/77 (mmHg): Body Mass Index(BMI): 46 Temperature(F): 97.7 Respiratory Rate 18 (breaths/min): Photos: [3:No Photos] [4:No Photos] [5:No Photos] Wound Location: [3:Right Gluteal fold] [4:Left Gluteal fold]  [5:Left Knee - Posterior] Wounding Event: [3:Pressure Injury] [4:Pressure Injury] [5:Gradually Appeared] Primary Etiology: [3:Pressure Ulcer] [4:Pressure Ulcer] [5:Diabetic Wound/Ulcer of the Lower Extremity] Comorbid History: [3:Asthma, Hypertension, Type II Diabetes, Neuropathy] [4:Asthma, Hypertension, Type II Diabetes, Neuropathy] [5:Asthma, Hypertension, Type II Diabetes, Neuropathy] Date Acquired: [3:04/21/2016] [4:04/21/2016] [5:07/15/2016] Weeks of Treatment: [3:8] [4:8] [5:8] Wound Status: [3:Open] [4:Open] [5:Open] Measurements L x W x D 1x0.6x5.5 [4:0.3x0.6x0.1] [5:0.2x0.2x0.2] (cm) Area (cm) : [3:0.471] [4:0.141] [5:0.031] Volume (cm) : [3:2.592] [4:0.014] [5:0.006] % Reduction in Area: [3:84.70%] [4:98.20%] [5:93.70%] % Reduction in Volume: 84.90% [4:99.90%] [5:99.90%] Position 1 (o'clock): [5:12] Maximum Distance 1 [5:15] (cm): Tunneling: [3:No] [4:No] [5:Yes] Classification: [3:Category/Stage IV] [4:Category/Stage IV] [5:Grade 2] Exudate Amount: [3:Large] [  4:Large] [5:Large] Exudate Type: [3:Purulent] [4:Serous] [5:Purulent] Exudate Color: [3:yellow, brown, green] [4:amber] [5:yellow, brown, green] Foul Odor After [3:Yes] [4:Yes] [5:Yes] Cleansing: Odor Anticipated Due to No [4:No] [5:No] Product Use: Wound Margin: [3:Distinct, outline attached] [4:Distinct, outline attached Flat and Intact] Granulation Amount: Small (1-33%) Large (67-100%) Large (67-100%) Granulation Quality: Pink, Pale Red, Pink Pink Necrotic Amount: Large (67-100%) Small (1-33%) Small (1-33%) Exposed Structures: Fascia: Yes Fat: Yes Fat: Yes Fat: Yes Muscle: Yes Muscle: Yes Muscle: Yes Fascia: No Fascia: No Tendon: No Tendon: No Tendon: No Joint: No Joint: No Bone: No Bone: No Epithelialization: None Medium (34-66%) None Periwound Skin Texture: No Abnormalities Noted No Abnormalities Noted Edema: Yes Excoriation: No Induration: No Callus: No Crepitus: No Fluctuance: No Friable:  No Rash: No Scarring: No Periwound Skin Maceration: Yes Maceration: Yes Maceration: Yes Moisture: Moist: Yes Moist: Yes Moist: Yes Dry/Scaly: No Periwound Skin Color: No Abnormalities Noted No Abnormalities Noted Erythema: Yes Rubor: Yes Atrophie Blanche: No Cyanosis: No Ecchymosis: No Hemosiderin Staining: No Mottled: No Pallor: No Erythema Location: N/A N/A Circumferential Temperature: No Abnormality No Abnormality Hot Tenderness on Yes Yes Yes Palpation: Wound Preparation: Ulcer Cleansing: Ulcer Cleansing: Ulcer Cleansing: Rinsed/Irrigated with Rinsed/Irrigated with Rinsed/Irrigated with Saline Saline Saline Topical Anesthetic Topical Anesthetic Topical Anesthetic Applied: Other: lidocaine Applied: Other: lidocaine Applied: Other: lidocaine 4% 4% 4% Treatment Notes Electronic Signature(s) Signed: 11/21/2016 4:53:13 PM By: Pinkerton, Debra Entered By: Pinkerton, Debra on 11/16/2016 09:41:05 Benedetti, Ople L. ([id number]) -------------------------------------------------------------------------------- Multi-Disciplinary Care Plan Details Patient Name: Yonts, Tennie L. Date of Service: 11/16/2016 9:00 AM Medical Record Number: [id number] Patient Account Number: [id number] Date of Birth/Sex: 05/31/1961 (55 y.o. Female) Treating RN: Pinkerton, Debi Primary Care Physician: BLISS, [name] Other Clinician: Referring Physician: BLISS, [name] Treating Physician/Extender: Coulter, Leah Weeks in Treatment: 8 Active Inactive Abuse / Safety / Falls / Self Care Management Nursing Diagnoses: Potential for falls Goals: Patient will remain injury free Date Initiated: 09/21/2016 Goal Status: Active Interventions: Assess fall risk on admission and as needed Notes: Nutrition Nursing Diagnoses: Imbalanced nutrition Potential for alteratiion in Nutrition/Potential for imbalanced nutrition Goals: Patient/caregiver agrees to and verbalizes understanding of need to use nutritional  supplements and/or vitamins as prescribed Date Initiated: 09/21/2016 Goal Status: Active Patient/caregiver verbalizes understanding of need to maintain therapeutic glucose control per primary care physician Date Initiated: 09/21/2016 Goal Status: Active Interventions: Assess patient nutrition upon admission and as needed per policy Notes: Orientation to the Wound Care Program Kloss, Valera L. ([id number]) Nursing Diagnoses: Knowledge deficit related to the wound healing center program Goals: Patient/caregiver will verbalize understanding of the Wound Healing Center Program Date Initiated: 09/21/2016 Goal Status: Active Interventions: Provide education on orientation to the wound center Notes: Pain, Acute or Chronic Nursing Diagnoses: Pain, acute or chronic: actual or potential Potential alteration in comfort, pain Goals: Patient will verbalize adequate pain control and receive pain control interventions during procedures as needed Date Initiated: 09/21/2016 Goal Status: Active Patient/caregiver will verbalize adequate pain control between visits Date Initiated: 09/21/2016 Goal Status: Active Patient/caregiver will verbalize comfort level met Date Initiated: 09/21/2016 Goal Status: Active Interventions: Assess comfort goal upon admission Complete pain assessment as per visit requirements Notes: Wound/Skin Impairment Nursing Diagnoses: Impaired tissue integrity Goals: Ulcer/skin breakdown will have a volume reduction of 30% by week 4 Date Initiated: 09/21/2016 Goal Status: Active Fifer, Zeeva L. ([id number]) Ulcer/skin breakdown will have a volume reduction of 50% by week 8 Date Initiated: 09/21/2016 Goal Status: Active Ulcer/skin breakdown will have a volume reduction of 80% by week 12   Date Initiated: 09/21/2016 Goal Status: Active Interventions: Assess ulceration(s) every visit Notes: Electronic Signature(s) Signed: 11/21/2016 4:53:13 PM By: Pinkerton, Debra Entered By:  Pinkerton, Debra on 11/16/2016 09:40:52 Woehler, Shyla L. ([id number]) -------------------------------------------------------------------------------- Pain Assessment Details Patient Name: Garcon, Harveen L. Date of Service: 11/16/2016 9:00 AM Medical Record Number: [id number] Patient Account Number: [id number] Date of Birth/Sex: 03/17/1961 (55 y.o. Female) Treating RN: Pinkerton, Debi Primary Care Physician: BLISS, [name] Other Clinician: Referring Physician: BLISS, [name] Treating Physician/Extender: Coulter, Leah Weeks in Treatment: 8 Active Problems Location of Pain Severity and Description of Pain Patient Has Paino Yes Site Locations Pain Location: Pain in Ulcers With Dressing Change: Yes Rate the pain. Current Pain Level: 6 Character of Pain Describe the Pain: Burning Pain Management and Medication Current Pain Management: Electronic Signature(s) Signed: 11/21/2016 4:53:13 PM By: Pinkerton, Debra Entered By: Pinkerton, Debra on 11/16/2016 09:23:57 Lesperance, Gabrella L. ([id number]) -------------------------------------------------------------------------------- Patient/Caregiver Education Details Patient Name: Delucchi, Emma-Lee L. Date of Service: 11/16/2016 9:00 AM Medical Record Number: [id number] Patient Account Number: [id number] Date of Birth/Gender: 04/23/1961 (55 y.o. Female) Treating RN: Pinkerton, Debi Primary Care Physician: BLISS, [name] Other Clinician: Referring Physician: BLISS, [name] Treating Physician/Extender: Coulter, Leah Weeks in Treatment: 8 Education Assessment Education Provided To: Patient Education Topics Provided Wound/Skin Impairment: Handouts: Other: change dressing as ordered and do not get dressing wet Methods: Demonstration, Explain/Verbal Responses: State content correctly Electronic Signature(s) Signed: 11/21/2016 4:53:13 PM By: Pinkerton, Debra Entered By: Pinkerton, Debra on 11/16/2016 11:42:33 Littlepage, Jisselle L.  ([id number]) -------------------------------------------------------------------------------- Wound Assessment Details Patient Name: Degregory, Elany L. Date of Service: 11/16/2016 9:00 AM Medical Record Number: [id number] Patient Account Number: [id number] Date of Birth/Sex: 03/29/1961 (55 y.o. Female) Treating RN: Pinkerton, Debi Primary Care Physician: BLISS, [name] Other Clinician: Referring Physician: BLISS, [name] Treating Physician/Extender: Coulter, Leah Weeks in Treatment: 8 Wound Status Wound Number: 3 Primary Pressure Ulcer Etiology: Wound Location: Right Gluteal fold Wound Status: Open Wounding Event: Pressure Injury Comorbid Asthma, Hypertension, Type II Date Acquired: 04/21/2016 History: Diabetes, Neuropathy Weeks Of Treatment: 8 Clustered Wound: No Photos Photo Uploaded By: Pinkerton, Debra on 11/16/2016 11:46:39 Wound Measurements Length: (cm) 1 Width: (cm) 0.6 Depth: (cm) 5.5 Area: (cm²) 0.471 Volume: (cm³) 2.592 % Reduction in Area: 84.7% % Reduction in Volume: 84.9% Epithelialization: None Tunneling: No Undermining: No Wound Description Classification: Category/Stage IV Wound Margin: Distinct, outline attached Exudate Amount: Large Exudate Type: Purulent Exudate Color: yellow, brown, green Foul Odor After Cleansing: Yes Due to Product Use: No Wound Bed Granulation Amount: Small (1-33%) Exposed Structure Granulation Quality: Pink, Pale Fascia Exposed: Yes Necrotic Amount: Large (67-100%) Fat Layer Exposed: Yes Necrotic Quality: Adherent Slough Tendon Exposed: No Muscle Exposed: Yes Necrosis of Muscle: No Joint Exposed: No Mccamy, Tamy L. ([id number]) Bone Exposed: No Periwound Skin Texture Texture Color No Abnormalities Noted: No No Abnormalities Noted: No Moisture Temperature / Pain No Abnormalities Noted: No Temperature: No Abnormality Maceration: Yes Tenderness on Palpation: Yes Moist: Yes Wound Preparation Ulcer Cleansing:  Rinsed/Irrigated with Saline Topical Anesthetic Applied: Other: lidocaine 4%, Treatment Notes Wound #3 (Right Gluteal fold) 1. Cleansed with: Clean wound with Normal Saline Cleanse wound with antibacterial soap and water 2. Anesthetic Topical Lidocaine 4% cream to wound bed prior to debridement 3. Peri-wound Care: Skin Prep 4. Dressing Applied: Aquacel Ag 5. Secondary Dressing Applied Dry Gauze [place] Electronic Signature(s) Signed: 11/21/2016 4:53:13 PM By: Pinkerton, Debra Entered By: Pinkerton, Debra on 11/16/2016 09:40:27 Paszkiewicz, Christabella L. ([id number]) -------------------------------------------------------------------------------- Wound Assessment Details Patient Name: Glaus, Kani L. Date of Service: 11/16/2016 9:00   AM Medical Record Number: [id number] Patient Account Number: [id number] Date of Birth/Sex: 12/06/1961 (55 y.o. Female) Treating RN: Pinkerton, Debi Primary Care Physician: BLISS, [name] Other Clinician: Referring Physician: BLISS, [name] Treating Physician/Extender: Coulter, Leah Weeks in Treatment: 8 Wound Status Wound Number: 4 Primary Pressure Ulcer Etiology: Wound Location: Left Gluteal fold Wound Status: Open Wounding Event: Pressure Injury Comorbid Asthma, Hypertension, Type II Date Acquired: 04/21/2016 History: Diabetes, Neuropathy Weeks Of Treatment: 8 Clustered Wound: No Photos Photo Uploaded By: Pinkerton, Debra on 11/16/2016 11:46:50 Wound Measurements Length: (cm) 0.3 Width: (cm) 0.6 Depth: (cm) 0.1 Area: (cm²) 0.141 Volume: (cm³) 0.014 % Reduction in Area: 98.2% % Reduction in Volume: 99.9% Epithelialization: Medium (34-66%) Tunneling: No Undermining: No Wound Description Classification: Category/Stage IV Wound Margin: Distinct, outline attached Exudate Amount: Large Exudate Type: Serous Exudate Color: amber Foul Odor After Cleansing: Yes Due to Product Use: No Wound Bed Granulation Amount: Large (67-100%) Exposed  Structure Granulation Quality: Red, Pink Fascia Exposed: No Necrotic Amount: Small (1-33%) Fat Layer Exposed: Yes Necrotic Quality: Adherent Slough Tendon Exposed: No Muscle Exposed: Yes Necrosis of Muscle: No Violante, Sariya L. ([id number]) Periwound Skin Texture Texture Color No Abnormalities Noted: No No Abnormalities Noted: No Moisture Temperature / Pain No Abnormalities Noted: No Temperature: No Abnormality Maceration: Yes Tenderness on Palpation: Yes Moist: Yes Wound Preparation Ulcer Cleansing: Rinsed/Irrigated with Saline Topical Anesthetic Applied: Other: lidocaine 4%, Treatment Notes Wound #4 (Left Gluteal fold) 1. Cleansed with: Clean wound with Normal Saline Cleanse wound with antibacterial soap and water 2. Anesthetic Topical Lidocaine 4% cream to wound bed prior to debridement 3. Peri-wound Care: Skin Prep 4. Dressing Applied: Aquacel Ag 5. Secondary Dressing Applied Dry Gauze [place] Electronic Signature(s) Signed: 11/21/2016 4:53:13 PM By: Pinkerton, Debra Entered By: Pinkerton, Debra on 11/16/2016 09:37:03 Bartolome, Annalisse L. ([id number]) -------------------------------------------------------------------------------- Wound Assessment Details Patient Name: Vary, Elania L. Date of Service: 11/16/2016 9:00 AM Medical Record Number: [id number] Patient Account Number: [id number] Date of Birth/Sex: 12/31/1960 (55 y.o. Female) Treating RN: Pinkerton, Debi Primary Care Physician: BLISS, [name] Other Clinician: Referring Physician: BLISS, [name] Treating Physician/Extender: Coulter, Leah Weeks in Treatment: 8 Wound Status Wound Number: 5 Primary Diabetic Wound/Ulcer of the Lower Etiology: Extremity Wound Location: Left Knee - Posterior Wound Status: Open Wounding Event: Gradually Appeared Comorbid Asthma, Hypertension, Type II Date Acquired: 07/15/2016 History: Diabetes, Neuropathy Weeks Of Treatment: 8 Clustered Wound: No Photos Photo Uploaded By:  Pinkerton, Debra on 11/16/2016 11:47:01 Wound Measurements Length: (cm) 0.2 Width: (cm) 0.2 Depth: (cm) 0.2 Area: (cm²) 0.031 Volume: (cm³) 0.006 % Reduction in Area: 93.7% % Reduction in Volume: 99.9% Epithelialization: None Tunneling: Yes Position (o'clock): 12 Maximum Distance: (cm) 15 Undermining: No Wound Description Classification: Grade 2 Wound Margin: Flat and Intact Exudate Amount: Large Exudate Type: Purulent Exudate Color: yellow, brown, green Foul Odor After Cleansing: Yes Due to Product Use: No Wound Bed Granulation Amount: Large (67-100%) Exposed Structure Granulation Quality: Pink Fascia Exposed: No Necrotic Amount: Small (1-33%) Fat Layer Exposed: Yes Necrotic Quality: Adherent Slough Tendon Exposed: No Dimperio, Yaritsa L. ([id number]) Muscle Exposed: Yes Necrosis of Muscle: No Joint Exposed: No Bone Exposed: No Periwound Skin Texture Texture Color No Abnormalities Noted: No No Abnormalities Noted: No Callus: No Atrophie Blanche: No Crepitus: No Cyanosis: No Excoriation: No Ecchymosis: No Fluctuance: No Erythema: Yes Friable: No Erythema Location: Circumferential Induration: No Hemosiderin Staining: No Localized Edema: Yes Mottled: No Rash: No Pallor: No Scarring: No Rubor: Yes Moisture Temperature / Pain No Abnormalities Noted: No Temperature: Hot Dry / Scaly: No   Tenderness on Palpation: Yes Maceration: Yes Moist: Yes Wound Preparation Ulcer Cleansing: Rinsed/Irrigated with Saline Topical Anesthetic Applied: Other: lidocaine 4%, Treatment Notes Wound #5 (Left, Posterior Knee) 1. Cleansed with: Clean wound with Normal Saline Cleanse wound with antibacterial soap and water 2. Anesthetic Topical Lidocaine 4% cream to wound bed prior to debridement 3. Peri-wound Care: Skin Prep 4. Dressing Applied: Iodoform packing Gauze 5. Secondary Dressing Applied Bordered Foam Dressing [place] Electronic Signature(s) Signed:  11/21/2016 4:53:13 PM By: Pinkerton, Debra Entered By: Pinkerton, Debra on 11/16/2016 09:31:44 Hams, Macie L. ([id number]) Hoel, Yona L. ([id number]) -------------------------------------------------------------------------------- Vitals Details Patient Name: Haub, Lucindia L. Date of Service: 11/16/2016 9:00 AM Medical Record Number: [id number] Patient Account Number: [id number] Date of Birth/Sex: 04/23/1961 (55 y.o. Female) Treating RN: Pinkerton, Debi Primary Care Physician: BLISS, [name] Other Clinician: Referring Physician: BLISS, [name] Treating Physician/Extender: Coulter, Leah Weeks in Treatment: 8 Vital Signs Time Taken: 09:21 Temperature (°F): 97.7 Height (in): 63 Pulse (bpm): 79 Weight (lbs): 257 Respiratory Rate (breaths/min): 18 Body Mass Index (BMI): 45.5 Blood Pressure (mmHg): 158/77 Reference Range: 80 - 120 mg / dl Electronic Signature(s) Signed: 11/21/2016 4:53:13 PM By: Pinkerton, Debra Entered By: Pinkerton, Debra on 11/16/2016 09:24:00 

## 2016-11-23 ENCOUNTER — Ambulatory Visit: Payer: Managed Care, Other (non HMO) | Admitting: Internal Medicine

## 2016-11-30 ENCOUNTER — Encounter: Payer: Managed Care, Other (non HMO) | Attending: Internal Medicine | Admitting: Internal Medicine

## 2016-11-30 DIAGNOSIS — L89314 Pressure ulcer of right buttock, stage 4: Secondary | ICD-10-CM | POA: Insufficient documentation

## 2016-11-30 DIAGNOSIS — L89324 Pressure ulcer of left buttock, stage 4: Secondary | ICD-10-CM | POA: Insufficient documentation

## 2016-11-30 DIAGNOSIS — I10 Essential (primary) hypertension: Secondary | ICD-10-CM | POA: Diagnosis not present

## 2016-11-30 DIAGNOSIS — F411 Generalized anxiety disorder: Secondary | ICD-10-CM | POA: Diagnosis not present

## 2016-11-30 DIAGNOSIS — L97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 severity: Secondary | ICD-10-CM | POA: Diagnosis not present

## 2016-11-30 DIAGNOSIS — F17210 Nicotine dependence, cigarettes, uncomplicated: Secondary | ICD-10-CM | POA: Diagnosis not present

## 2016-11-30 DIAGNOSIS — E1142 Type 2 diabetes mellitus with diabetic polyneuropathy: Secondary | ICD-10-CM | POA: Diagnosis not present

## 2016-11-30 DIAGNOSIS — M8618 Other acute osteomyelitis, other site: Secondary | ICD-10-CM | POA: Insufficient documentation

## 2016-11-30 DIAGNOSIS — K219 Gastro-esophageal reflux disease without esophagitis: Secondary | ICD-10-CM | POA: Insufficient documentation

## 2016-12-01 NOTE — Progress Notes (Signed)
[name], [name] ([id number]) Visit Report for 11/30/2016 Chief Complaint Document Details Patient Name: [name], [name] Date of Service: 11/30/2016 8:00 AM Medical Record Patient Account Number: [id number] Number: Treating RN: [name] 12/30/1960 (55 y.o. Other Clinician: Date of Birth/Sex: Female) Treating [name] Primary Care Physician/Extender: [name] Physician: Referring Physician: [name] in Treatment: 10 Information Obtained from: Patient Chief Complaint [name] presents today for routine managament of bilateral ischial pressure ulcers and sinus tract to left popliteal fossa. Electronic Signature(s) Signed: 11/30/2016 4:39:43 PM By: Katelyn Najjar MD Entered By: [name] on 11/30/2016 08:54:15 [name] ([id number]) -------------------------------------------------------------------------------- Debridement Details Patient Name: [name] Date of Service: 11/30/2016 8:00 AM Medical Record Patient Account Number: [id number] Number: Treating RN: [name] 04/24/1961 (55 y.o. Other Clinician: Date of Birth/Sex: Female) Treating [name] Primary Care Physician/Extender: [name] Physician: Referring Physician: [name] in Treatment: 10 Debridement Performed for Wound #3 Right Gluteal fold Assessment: Performed By: Physician Katelyn Caul, MD Debridement: Debridement Pre-procedure Yes - 08:26 Verification/Time Out Taken: Start Time: 08:27 Pain Control: Lidocaine 4% Topical Solution Level: Skin/Subcutaneous Tissue Total Area Debrided (L x 1 (cm) x 0.6 (cm) = 0.6 (cm) W): Tissue and other Viable, Non-Viable, Exudate, Fibrin/Slough, Subcutaneous material debrided: Instrument: Other : scoop Bleeding: Minimum Hemostasis Achieved: Pressure End Time: 08:29 Procedural Pain: 0 Post Procedural Pain: 0 Response to Treatment: Procedure was tolerated well Post Debridement  Measurements of Total Wound Length: (cm) 1 Stage: Category/Stage IV Width: (cm) 0.6 Depth: (cm) 6.5 Volume: (cm) 3.063 Character of Wound/Ulcer Post Requires Further Debridement: Debridement Severity of Tissue Post Fat layer exposed Debridement: Post Procedure Diagnosis Same as Pre-procedure Electronic Signature(s) [name] ([id number]) Signed: 11/30/2016 4:39:43 PM By: Katelyn Najjar MD Signed: 11/30/2016 4:44:01 PM By: [name] Entered By: [name] on 11/30/2016 08:51:23 [name], [name] ([id number]) -------------------------------------------------------------------------------- HPI Details Patient Name: [name] Date of Service: 11/30/2016 8:00 AM Medical Record Patient Account Number: [id number] Number: Treating RN: [name] 1961/03/04 (55 y.o. Other Clinician: Date of Birth/Sex: Female) Treating [name] Primary Care Physician/Extender: [name] Physician: Referring Physician: [name] in Treatment: 10 History of Present Illness Location: bilateral gluteal ulcerations and left popliteal ulceration with tunneling Quality: adits to intermittent aching to ischial ulcers, no discomfort to sinus tract Severity: right iscial ulcer with increased depth Duration: chronic ulcers to bilateral ischial ulcers and sinus tract Timing: The pain is intermittent in severity as far as how intense it becomes but is present all the time. Manipulationn makes this worse. Context: The wound occurred when the patient had a fall and was unconscious for about 48 hours laying on the floor and she had pressure injury at that stage. Modifying Factors: Other treatment(s) tried include:as noted below she has been seen by visiting wound care physicians or nurse practitioners and details have been noted Associated Signs and Symptoms: has yet to receive offloading cushions and mattress overlay HPI Description: 55 year old patient who was  seen by visiting [name] wound care specialist for a wound on both her buttock and was found to have an unstageable wound on the right buttock for about 2 months. I understand that she had a fall and was laying on the floor for about 48 hours before she was found and taken to the ICU and had a long injury to her gluteal area from pressure and also had broken her right humerus. She has had a right proximal humerus  fracture and has been followed up with orthopedics recently. The patient has a past medical history of type 2 diabetes mellitus, paraparesis, acute pyelonephritis, GERD, hypertension, glaucoma, chronic pain, anxiety neurosis, nicotine dependence, COPD. the patient had some debridement done and was to operative was recommended to use Silvadene dressing and offloading. She is a smoker and occasionally smokes a few cigarettes. the patient requested a second opinion for months and is here to discuss her care. 09/21/16; the patient re-presents from home today for review of 3 different wounds. I note that she was seen in the clinic here in July at which time she had bilateral buttock wounds. It was apparently suggested at that time that she use a wound VAC bridged to both wounds just near the initial tuberosity's bilaterally which she refused. The history was a bit difficult to put together. Apparently this patient became ill at the end of April of this year. She was found sitting on the floor she had apparently been for 2 days and subsequently admitted to hospital from 04/23/16 through 05/02/16 and at that point she was critically ill ultimately having sepsis secondary to UTI, nontraumatic rhabdomyolysis and diabetic ketoacidosis. She had acute renal failure and I think required ICU care including intubation. Patient states her wounds actually started at that point on the bilateral issue tuberosities however in reviewing the discharge summary from 5/8 I see no reference to wounds at that point. It  did state that she had left lower extremity cellulitis however. Reviewing Epic I see no relevant x-rays. It would appear that her discharge creatinine was within the normal range and indeed on [name], [name] ([id number]) 9/15 her creatinine has remained normal. [facility] There the wounds on her bilateral Botox were dressed. Only just before her discharge from the nursing facility she developed an "knot" which was interpreted as cellulitis on the posterior aspect of her left knee she was given antibiotics. Apparently sometime late in July a this actually opened and became a wound at home health care was tending to however she is still having purulent drainage coming from this and by my understanding the wound depth is actually become unmeasurable. I am not really clear about what home health has been placing in any of these wound areas. The patient states that is something with silver and it. She is not been systemically unwell no fever or chills her appetite is good. She is a diabetic poorly controlled however she states that her recent blood sugars at home have been in the low to mid 100s. 09/28/16 On evaluation today patient appears to continue to exhibit the 3 areas of ulceration that were noted previous. She did have an x-ray of the right pelvis which showed evidence of potential soft tissue infection but no obvious osteomyelitis. There was a discussion last office visit concerning the possibility of a wound VAC. Witth that being said the x-ray report suggested that an MRI may be more appropriate to further evaluate the extent. Subsequently in regard to the wound over the popliteal portion of the left lower extremity with tunneling at 12:00 the CT scan that was ordered was denied by insurance as they state the patient has not had x-rays prior to advanced imaging. Patient states that she is frustrated with the  situation overall. 10/05/16 in the interval since I last saw this patient last week she has had the x-ray of the knee performed. I did review that x-ray today and fortunately  shows no evidence of osteomyelitis or other acute abnormality at this point in time. She continues to have the opening iin the posterior oral popliteal space with tracking proximal up the posterior thigh. Nothing seems to have worsened but it also seems to have not improved. The same is true in regard to the right pressure ulcer over the gluteal region which extends toward the ischium. The left gluteal pressure ulcer actually appears to be doing somewhat better my opinion there is some necrotic slough but overall this appears fairly well. She tells me thatt she has some discomfort especially when home health is helping her with dressing changes as they do not know her. At worse she rates her pain to be a 5 out of 10 right now it's more like a 1 out of 10. 10/07/16; still the patient has 3 different wound areas. She has a deep stage IV wound over her right ischial tuberosity. She is due to have an MRI next week. The wound over her left ischial tuberosity is more superficial and underwent debridement today. Finally she has a small open area in her left popliteal fossa the probes on measurably forward superiorly. Still a lot of drainage coming out of this. The CT scan that I ordered 3 weeks ago was questioned by her insurance company wanting a plain x-ray first. As I understand things result of this is nothing has been done in 3 weeks in terms of imaging the thigh and she has an MRI booked of this along with her pelvis for next week line 10/12/16; patient has a deep probing wound over the right ischiall tuberosity, stage III wound over the left visual tuberosity and a draining sinus in her left popliteal fossa. None of this much different from when I saw this 3 weeks ago. We have been using silver rope to the right ischial  wound and a draining area in the left popliteal fossa. Plain silver alginate to the area on the left ischial tuberosity 10/19/16; the patient's wounds are essentially unchanged although the area on the left lower gluteal is actually improved. Our intake nurse noted drainage from the right initial tuberosity probing wound as well as the draining area in the left popliteal fossa. Both of these were cultured. She had x-rays I think at the insistence of her insurance company on 09/23/16 x-ray of the pelvis was not particularly helpful she did have soft tissue air over the right lower pelvis although with the depth of this wound this is not surprising. An x-ray of her left knee did not show any specific abnormalities. We are still using silver alginate to these wound areas. Her MRI is booked for 10/27 10/26/16; cultures of the purulent drainage in her right initial tuberosity wound grew moderate Proteus and few staph aureus. The same organisms were cultured out of the left knee sinus tract posteriorly. The staph aureus is MRSA. I had started her on Augmentin last week I added doxycycline. The MRI of the left lower [name], [name] ([id number]) extremity and pelvis was finally done. The MRI of the femur showed subcutaneous soft tissue swelling edema fluid and myositis in the vastus lateralis muscle but no soft tissue abscess septic arthritis or osteomyelitis. MRI of the pelvis showed the left wound to be more expensive extending down to the bone there was osteomyelitis. Left hamstring tendons were also involved. No septic arthritis involving the hip. The decubitus ulcer on the right side showed no definite osteomyelitis or abscess.. The right hip wound is  actually the one the probes 6 cm downward. But the MRI showing infection including osteomyelitis on the left explains the draining sinus in the popliteal fossa on the left. She did have antibiotics in the hospitalization last time and this extended into  her nursing home stay but I'm not exactly sure what antibiotics and for what duration. According the patient this did include vancomycin with considerable effort of our staff we are able to get the patient into see Dr. Sampson Goon today. There were transportation difficulties. Her mother had open heart surgery and is in the ICU in [place] therefore her brother was unable to transport. Dr. Jarrett Ables office graciously arranged time to see her today. From my point of view she is going to require IV vancomycin plus perhaps a third generation cephalosporin. I plan to keep her on doxycycline and Augmentin until the IV antibiotics can be arranged. 11/02/2016 - [name] presents today for management of ulcers; She saw Dr. 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 She is to have the PICC line placed today and receive the initial dose for both antibiotics today. She has yet to receive the offloading chair cushion and/or mattress overlay from home health, apparently this has been a 3 week process. I encouraged her to speak to home health regarding this matter, along with offering home health to contact the wouns care center with any questions or concerns. The left ischial pressure ulcer continues to imporve, while to right ischial ulcer has increased in depth. The popliteal fossa sinus tract remains unmeasurable due to the limitation of depth measurement (tract extends beyond our measuring devices). 11-16-2016 [name] presents today for evaluation and management of bilateral ischial stage IV pressure ulcers and sinus tract to the left popliteal fossa. she is under the care of Dr. Sampson Goon for IV antibiotic therapy; she states that the vancomycin was placed on hold and will be restarted at a lower dose based on her renal function. She continues taking Zosyn in addition to the vancomycin. She also states that  she has yet to receive offloading cushions from home health, according to her she does not qualify for these offloading cushions because "the ulcers are unstageable ". We will contact the home health agency today to lend clarity regarding her pressure ulcers. The left popliteal fossa sinus tract continues to be a measurable as it extends beyond the length of our measuring devices.. 11/30/16; the patient is still on vancomycin and Zosyn. The depth of the draining sinus behind her left popliteal fossa is down to 4 cm although there is still serosanguineous drainage coming out of this. She saw Dr. Sampson Goon of infectious disease yesterday the idea is to weeks more of IV antibiotics and then oral antibiotics although I have not read his note. The area on the left gluteal fold is just about healed. She has a 6 cm draining sinus over the right initial tuberosity although I cannot feel bone at the base of this. As far as the patient is aware she has not had a recheck of her inflammatory markers Electronic Signature(s) Signed: 11/30/2016 4:39:43 PM By: Katelyn Najjar MD Entered By: [name] on 11/30/2016 08:57:36 [name], [name] ([id number]) -------------------------------------------------------------------------------- Physical Exam Details Patient Name: [name] Date of Service: 11/30/2016 8:00 AM Medical Record Patient Account Number: [id number] Number: Treating RN: [name] 1961/05/29 (55 y.o. Other Clinician: Date of Birth/Sex: Female) Treating Donique Hammonds Primary Care Physician/Extender: Katelyn Breach,  [name] Physician: Referring Physician: [name] in Treatment: 10 Constitutional Patient is hypertensive.. Pulse regular and within target range for patient.Marland Kitchen Respirations regular, non-labored and within target range.. Temperature is normal and within the target range for the patient.. Patient's appearance is neat and clean. Appears in no acute distress.  Well nourished and well developed.. Notes Wound exam; the dimensions of the draining sinus binder left popliteal which was in connection with underlying osteomyelitis in what was her stage IV wound on the left are improved. She is still on IV antibiotics for a further 2 weeks. She is using iodoform packing #2 from a wound care point of view the major problem here is a deep draining tunnel/sinus over the right initial tuberosity this is 6 cm in depth. There is no drainage coming out of this and I do not feel bone with a curette. I removed as much's surface slough from the sides and base of this wound that I could without direct visualization. She is using silver alginate packing rope on the right, silver alginate to the left Electronic Signature(s) Signed: 11/30/2016 4:39:43 PM By: Katelyn Najjar MD Entered By: [name] on 11/30/2016 09:00:29 [name], [name] ([id number]) -------------------------------------------------------------------------------- Physician Orders Details Patient Name: [name] Date of Service: 11/30/2016 8:00 AM Medical Record Patient Account Number: [id number] Number: Treating RN: [name] Sep 03, 1961 (55 y.o. Other Clinician: Date of Birth/Sex: Female) Treating Tom Macpherson Primary Care Physician/Extender: [name] Physician: Referring Physician: [name] in Treatment: 10 Verbal / Phone Orders: Yes ClinicianAshok Cordia, Debi Read Back and Verified: Yes Diagnosis Coding Wound Cleansing Wound #3 Right Gluteal fold o Clean wound with Normal Saline. o Cleanse wound with mild soap and water Wound #4 Left Gluteal fold o Clean wound with Normal Saline. o Cleanse wound with mild soap and water Wound #5 Left,Posterior Knee o Clean wound with Normal Saline. o Cleanse wound with mild soap and water Anesthetic Wound #3 Right Gluteal fold o Topical Lidocaine 4% cream applied to wound bed prior to  debridement - clinic use only Wound #4 Left Gluteal fold o Topical Lidocaine 4% cream applied to wound bed prior to debridement - clinic use only Wound #5 Left,Posterior Knee o Topical Lidocaine 4% cream applied to wound bed prior to debridement - clinic use only Skin Barriers/Peri-Wound Care Wound #3 Right Gluteal fold o Skin Prep Wound #4 Left Gluteal fold o Skin Prep Wound #5 Left,Posterior Knee o Skin Prep Primary Wound Dressing [name], [name] ([id number]) Wound #3 Right Gluteal fold o Aquacel Ag - or equivalent to Silver Alginate Rope pack lightly into wound and leave a tail where it can be pulled out and to cover the outside of the wound. Wound #4 Left Gluteal fold o Aquacel Ag - Silver Alginate Wound #5 Left,Posterior Knee o Iodoform packing Gauze - Pack lightly into wound at 12 o'clock and leave a tail where it can be pulled out and to cover the outside of the wound, pack in to wound at as far as you can to keep the tunnel open. Secondary Dressing Wound #3 Right Gluteal fold o Dry Gauze o Non-adherent pad - Telfa island, tape, or BFD Wound #4 Left Gluteal fold o Dry Gauze o Non-adherent pad - Telfa island, tape, or BFD Wound #5 Left,Posterior Knee o Dry Gauze o Non-adherent pad - Telfa island, tape, or BFD Dressing Change Frequency Wound #3 Right Gluteal fold o Change dressing every other day. Wound #4 Left Gluteal fold o Change dressing  every other day. Wound #5 Left,Posterior Knee o Change dressing every other day. Follow-up Appointments Wound #3 Right Gluteal fold o Return Appointment in 1 week. Wound #4 Left Gluteal fold o Return Appointment in 1 week. Wound #5 Left,Posterior Knee o Return Appointment in 1 week. Off-Loading Wound #3 Right Gluteal fold o Roho cushion for wheelchair - *********[facility] to order*********** Beaumont, [name] ([id number]) o Turn and reposition every 2 hours - Air  Mattress *********[facility] to order*********** Wound #4 Left Gluteal fold o Roho cushion for wheelchair - *********[facility] to order*********** o Mattress - Museum/gallery curator *********[facility] to order*********** o Turn and reposition every 2 hours Wound #5 Left,Posterior Knee o Roho cushion for wheelchair - *********[facility] to order*********** o Mattress - Museum/gallery curator *********[facility] to order*********** o Turn and reposition every 2 hours Home Health Wound #3 Right Gluteal fold o Continue Home Health Visits - [facility] *****Please order ROHO cushion and air mattress****** o Home Health Nurse may visit PRN to address patientos wound care needs. o FACE TO FACE ENCOUNTER: MEDICARE and MEDICAID PATIENTS: I certify that this patient is under my care and that I had a face-to-face encounter that meets the physician face-to-face encounter requirements with this patient on this date. The encounter with the patient was in whole or in part for the following MEDICAL CONDITION: (primary reason for Home Healthcare) MEDICAL NECESSITY: I certify, that based on my findings, NURSING services are a medically necessary home health service. HOME BOUND STATUS: I certify that my clinical findings support that this patient is homebound (i.e., Due to illness or injury, pt requires aid of supportive devices such as crutches, cane, wheelchairs, walkers, the use of special transportation or the assistance of another person to leave their place of residence. There is a normal inability to leave the home and doing so requires considerable and taxing effort. Other absences are for medical reasons / religious services and are infrequent or of short duration when for other reasons). o If current dressing causes regression in wound condition, may D/C ordered dressing product/s and apply Normal Saline Moist Dressing daily until next Wound Healing  Center / Other MD appointment. Notify Wound Healing Center of regression in wound condition at [phone]. o Please direct any NON-WOUND related issues/requests for orders to patient's Primary Care Physician Wound #4 Left Gluteal fold o Continue [facility] Health Visits - [facility] *****Please order ROHO cushion and air mattress****** o Home Health Nurse may visit PRN to address patientos wound care needs. o FACE TO FACE ENCOUNTER: MEDICARE and MEDICAID PATIENTS: I certify that this patient is under my care and that I had a face-to-face encounter that meets the physician face-to-face encounter requirements with this patient on this date. The encounter with the patient was in whole or in part for the following MEDICAL CONDITION: (primary reason for Home Healthcare) MEDICAL NECESSITY: I certify, that based on my findings, NURSING services are a medically necessary home health service. HOME BOUND STATUS: I certify that my clinical findings support that this patient is homebound (i.e., Due to illness or injury, pt requires aid of Junious, Nohealani L. ([id number]) supportive devices such as crutches, cane, wheelchairs, walkers, the use of special transportation or the assistance of another person to leave their place of residence. There is a normal inability to leave the home and doing so requires considerable and taxing effort. Other absences are for medical reasons / religious services and are  infrequent or of short duration when for other reasons). o If current dressing causes regression in wound condition, may D/C ordered dressing product/s and apply Normal Saline Moist Dressing daily until next Wound Healing Center / Other MD appointment. Notify Wound Healing Center of regression in wound condition at [phone]. o Please direct any NON-WOUND related issues/requests for orders to patient's Primary Care Physician Wound #5 Left,Posterior Knee o Continue Home Health Visits  - [facility] *****Please order ROHO cushion and air mattress****** o Home Health Nurse may visit PRN to address patientos wound care needs. o FACE TO FACE ENCOUNTER: MEDICARE and MEDICAID PATIENTS: I certify that this patient is under my care and that I had a face-to-face encounter that meets the physician face-to-face encounter requirements with this patient on this date. The encounter with the patient was in whole or in part for the following MEDICAL CONDITION: (primary reason for Home Healthcare) MEDICAL NECESSITY: I certify, that based on my findings, NURSING services are a medically necessary home health service. HOME BOUND STATUS: I certify that my clinical findings support that this patient is homebound (i.e., Due to illness or injury, pt requires aid of supportive devices such as crutches, cane, wheelchairs, walkers, the use of special transportation or the assistance of another person to leave their place of residence. There is a normal inability to leave the home and doing so requires considerable and taxing effort. Other absences are for medical reasons / religious services and are infrequent or of short duration when for other reasons). o If current dressing causes regression in wound condition, may D/C ordered dressing product/s and apply Normal Saline Moist Dressing daily until next [facility] / Other MD appointment. Notify W[facility] Healing Center of regression in wound condition at [phone]. o Please direct any NON-WOUND related issues/requests for orders to patient's Primary Care Physician Medications-please add to medication list. Wound #3 Right Gluteal fold o Other: - Vitamin C, Zinc, Multivitamin Wound #4 Left Gluteal fold o Other: - Vitamin C, Zinc, Multivitamin o Other: - Vitamin C, Zinc, Multivitamin IV Antibiotics Wound #5 Left,Posterior Knee o Other: - Vitamin C, Zinc, Multivitamin IV Antibiotics Electronic  Signature(s) Signed: 11/30/2016 4:39:43 PM By: Katelyn Najjar MD Huitron, [name] ([id number]) Signed: 11/30/2016 4:44:01 PM By: [name] Entered By: [name] on 11/30/2016 08:30:27 Roat, [name] ([id number]) -------------------------------------------------------------------------------- Problem List Details Patient Name: [name] Date of Service: 11/30/2016 8:00 AM Medical Record Patient Account Number: [id number] Number: Treating RN: [name] [birth year] (55 y.o. 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 ([id number]) Skirvin, Koreen Elbert Ewings ([id number]) -------------------------------------------------------------------------------- Progress Note Details Patient Name: [name] Date of Service: 11/30/2016 8:00 AM Medical Record Patient Account Number: [id number] Number: Treating RN: [name] 1961/06/12 (55 y.o. Other Clinician: Date of Birth/Sex: Female) Treating Jaydi Bray  Primary Care Physician/Extender: [name] Physician: Referring Physician: [name] in Treatment: 10 Subjective Chief Complaint Information obtained from Patient Ms. Hineman presents today for routine managament of bilateral ischial pressure ulcers and sinus tract to left popliteal fossa. History of Present Illness (HPI) The following HPI elements were documented for the patient's wound: Location: bilateral gluteal ulcerations and left popliteal ulceration with tunneling Quality: adits to intermittent aching to ischial ulcers, no discomfort to sinus tract Severity: right iscial ulcer with increased depth Duration: chronic ulcers to bilateral ischial ulcers and sinus tract Timing: The pain is intermittent in severity as far as how intense it becomes but is present all the time. Manipulationn makes this worse. Context: The wound occurred when the patient had a fall and was unconscious for about 48 hours laying on the floor and she had pressure injury at that stage. Modifying Factors: Other treatment(s) tried include:as noted below she has been seen by visiting wound care physicians or nurse practitioners and details have been noted Associated Signs and Symptoms: has yet to receive offloading cushions and mattress overlay 55 year old patient who was seen by visiting [name] wound care specialist for a wound on both her buttock and was found to have an unstageable wound on the right buttock for about 2 months. I understand that she had a fall and was laying on the floor for about 48 hours before she was found and taken to the ICU and had a long injury to her gluteal area from pressure and also had broken her right humerus. She has had a right proximal humerus fracture and has been followed up with orthopedics recently. The patient has a past medical history of type 2 diabetes mellitus, paraparesis, acute pyelonephritis, GERD, hypertension, glaucoma, chronic pain, anxiety neurosis, nicotine dependence, COPD. the patient had some debridement done and was to operative was  recommended to use Silvadene dressing and offloading. She is a smoker and occasionally smokes a few cigarettes. the patient requested a second opinion for months and is here to discuss her care. 09/21/16; the patient re-presents from home today for review of 3 different wounds. I note that she was seen in the clinic here in July at which time she had bilateral buttock wounds. It was apparently suggested at that time that she use a wound VAC bridged to both wounds just near the initial tuberosity's bilaterally which she refused. [name], [name] ([id number]) The history was a bit difficult to put together. Apparently this patient became ill at the end of April of this year. She was found sitting on the floor she had apparently been for 2 days and subsequently admitted to hospital from 04/23/16 through 05/02/16 and at that point she was critically ill ultimately having sepsis secondary to UTI, nontraumatic rhabdomyolysis and diabetic ketoacidosis. She had acute renal failure and I think required ICU care including intubation. Patient states her wounds actually started at that point on the bilateral issue tuberosities however in reviewing the discharge summary from 5/8 I see no reference to wounds at that point. It did state that she had left lower extremity cellulitis however. Reviewing Epic I see no relevant x-rays. It would appear that her discharge creatinine was within the normal range and indeed on 9/15 her creatinine has remained normal. [facility] There the wounds on her bilateral Botox were dressed. Only just before her discharge from the nursing facility she developed an "knot" which was interpreted as cellulitis on the  posterior aspect of her left knee she was given antibiotics. Apparently sometime late in July a this actually opened and became a wound at home health care was tending to however she is still having purulent drainage  coming from this and by my understanding the wound depth is actually become unmeasurable. I am not really clear about what home health has been placing in any of these wound areas. The patient states that is something with silver and it. She is not been systemically unwell no fever or chills her appetite is good. She is a diabetic poorly controlled however she states that her recent blood sugars at home have been in the low to mid 100s. 09/28/16 On evaluation today patient appears to continue to exhibit the 3 areas of ulceration that were noted previous. She did have an x-ray of the right pelvis which showed evidence of potential soft tissue infection but no obvious osteomyelitis. There was a discussion last office visit concerning the possibility of a wound VAC. Witth that being said the x-ray report suggested that an MRI may be more appropriate to further evaluate the extent. Subsequently in regard to the wound over the popliteal portion of the left lower extremity with tunneling at 12:00 the CT scan that was ordered was denied by insurance as they state the patient has not had x-rays prior to advanced imaging. Patient states that she is frustrated with the situation overall. 10/05/16 in the interval since I last saw this patient last week she has had the x-ray of the knee performed. I did review that x-ray today and fortunately shows no evidence of osteomyelitis or other acute abnormality at this point in time. She continues to have the opening iin the posterior oral popliteal space with tracking proximal up the posterior thigh. Nothing seems to have worsened but it also seems to have not improved. The same is true in regard to the right pressure ulcer over the gluteal region which extends toward the ischium. The left gluteal pressure ulcer actually appears to be doing somewhat better my opinion there is some necrotic slough but overall this appears fairly well. She tells me thatt she has some  discomfort especially when home health is helping her with dressing changes as they do not know her. At worse she rates her pain to be a 5 out of 10 right now it's more like a 1 out of 10. 10/07/16; still the patient has 3 different wound areas. She has a deep stage IV wound over her right ischial tuberosity. She is due to have an MRI next week. The wound over her left ischial tuberosity is more superficial and underwent debridement today. Finally she has a small open area in her left popliteal fossa the probes on measurably forward superiorly. Still a lot of drainage coming out of this. The CT scan that I ordered 3 weeks ago was questioned by her insurance company wanting a plain x-ray first. As I understand things result of this is nothing has been done in 3 weeks in terms of imaging the thigh and she has an MRI booked of this along with her pelvis for next week line 10/12/16; patient has a deep probing wound over the right ischiall tuberosity, stage III wound over the left visual tuberosity and a draining sinus in her left popliteal fossa. None of this much different from when I saw this 3 weeks ago. We have been using silver rope to the right ischial wound and a draining area in the left  popliteal fossa. Plain silver alginate to the area on the left ischial tuberosity 10/19/16; the patient's wounds are essentially unchanged although the area on the left lower gluteal is Sheen, Genisis L. ([id number]) actually improved. Our intake nurse noted drainage from the right initial tuberosity probing wound as well as the draining area in the left popliteal fossa. Both of these were cultured. She had x-rays I think at the insistence of her insurance company on 09/23/16 x-ray of the pelvis was not particularly helpful she did have soft tissue air over the right lower pelvis although with the depth of this wound this is not surprising. An x-ray of her left knee did not show any specific abnormalities. We are  still using silver alginate to these wound areas. Her MRI is booked for 10/27 10/26/16; cultures of the purulent drainage in her right initial tuberosity wound grew moderate Proteus and few staph aureus. The same organisms were cultured out of the left knee sinus tract posteriorly. The staph aureus is MRSA. I had started her on Augmentin last week I added doxycycline. The MRI of the left lower extremity and pelvis was finally done. The MRI of the femur showed subcutaneous soft tissue swelling edema fluid and myositis in the vastus lateralis muscle but no soft tissue abscess septic arthritis or osteomyelitis. MRI of the pelvis showed the left wound to be more expensive extending down to the bone there was osteomyelitis. Left hamstring tendons were also involved. No septic arthritis involving the hip. The decubitus ulcer on the right side showed no definite osteomyelitis or abscess.. The right hip wound is actually the one the probes 6 cm downward. But the MRI showing infection including osteomyelitis on the left explains the draining sinus in the popliteal fossa on the left. She did have antibiotics in the hospitalization last time and this extended into her nursing home stay but I'm not exactly sure what antibiotics and for what duration. According the patient this did include vancomycin with considerable effort of our staff we are able to get the patient into see Dr. Sampson Goon today. There were transportation difficulties. Her mother had open heart surgery and is in the ICU in [place] therefore her brother was unable to transport. Dr. Jarrett Ables office graciously arranged time to see her today. From my point of view she is going to require IV vancomycin plus perhaps a third generation cephalosporin. I plan to keep her on doxycycline and Augmentin until the IV antibiotics can be arranged. 11/02/2016 - Arrion presents today for management of ulcers; She saw Dr. 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 She is to have the PICC line placed today and receive the initial dose for both antibiotics today. She has yet to receive the offloading chair cushion and/or mattress overlay from home health, apparently this has been a 3 week process. I encouraged her to speak to home health regarding this matter, along with offering home health to contact the wouns care center with any questions or concerns. The left ischial pressure ulcer continues to imporve, while to right ischial ulcer has increased in depth. The popliteal fossa sinus tract remains unmeasurable due to the limitation of depth measurement (tract extends beyond our measuring devices). 11-16-2016 [name] presents today for evaluation and management of bilateral ischial stage IV pressure ulcers and sinus tract to the left popliteal fossa. she is under the care of Dr. Sampson Goon for IV antibiotic therapy; she states that  the vancomycin was placed on hold and will be restarted at a lower dose based on her renal function. She continues taking Zosyn in addition to the vancomycin. She also states that she has yet to receive offloading cushions from home health, according to her she does not qualify for these offloading cushions because "the ulcers are unstageable ". We will contact the home health agency today to lend clarity regarding her pressure ulcers. The left popliteal fossa sinus tract continues to be a measurable as it extends beyond the length of our measuring devices.. 11/30/16; the patient is still on vancomycin and Zosyn. The depth of the draining sinus behind her left popliteal fossa is down to 4 cm although there is still serosanguineous drainage coming out of this. She saw Dr. Sampson Goon of infectious disease yesterday the idea is to weeks more of IV antibiotics and then oral antibiotics although I have not read his note.  The area on the left gluteal fold is just about healed. She has a 6 cm draining sinus over the right initial tuberosity although I cannot feel bone at the base of this. As far as the patient is aware she has not had a recheck of her inflammatory markers [name], [name] ([id number]) Objective Constitutional Patient is hypertensive.. Pulse regular and within target range for patient.Marland Kitchen Respirations regular, non-labored and within target range.. Temperature is normal and within the target range for the patient.. Patient's appearance is neat and clean. Appears in no acute distress. Well nourished and well developed.. Vitals Time Taken: 8:06 AM, Height: 63 in, Weight: 257 lbs, BMI: 45.5, Temperature: 98.2 F, Pulse: 86 bpm, Respiratory Rate: 18 breaths/min, Blood Pressure: 149/63 mmHg. General Notes: Wound exam; the dimensions of the draining sinus binder left popliteal which was in connection with underlying osteomyelitis in what was her stage IV wound on the left are improved. She is still on IV antibiotics for a further 2 weeks. She is using iodoform packing #2 from a wound care point of view the major problem here is a deep draining tunnel/sinus over the right initial tuberosity this is 6 cm in depth. There is no drainage coming out of this and I do not feel bone with a curette. I removed as much's surface slough from the sides and base of this wound that I could without direct visualization. She is using silver alginate packing rope on the right, silver alginate to the left Integumentary (Hair, Skin) Wound #3 status is Open. Original cause of wound was Pressure Injury. The wound is located on the Right Gluteal fold. The wound measures 1cm length x 0.6cm width x 6.5cm depth; 0.471cm^2 area and 3.063cm^3 volume. There is muscle, fat, and fascia exposed. There is no tunneling or undermining noted. There is a large amount of purulent drainage noted. The wound margin is distinct with the outline  attached to the wound base. There is small (1-33%) pink, pale granulation within the wound bed. There is a large (67-100%) amount of necrotic tissue within the wound bed including Adherent Slough. The periwound skin appearance exhibited: Maceration, Moist. Periwound temperature was noted as No Abnormality. The periwound has tenderness on palpation. Wound #4 status is Open. Original cause of wound was Pressure Injury. The wound is located on the Left Gluteal fold. The wound measures 0.4cm length x 1.8cm width x 0.1cm depth; 0.565cm^2 area and 0.057cm^3 volume. There is muscle and fat exposed. There is no tunneling or undermining noted. There is a large amount of serous drainage noted. The wound  margin is distinct with the outline attached to the wound base. There is large (67-100%) red, pink granulation within the wound bed. There is a small (1-33%) amount of necrotic tissue within the wound bed including Adherent Slough. The periwound skin appearance exhibited: Maceration, Moist. Periwound temperature was noted as No Abnormality. The periwound has tenderness on palpation. Wound #5 status is Open. Original cause of wound was Gradually Appeared. The wound is located on the Left,Posterior Knee. The wound measures 0.2cm length x 0.2cm width x 0.2cm depth; 0.031cm^2 area and 0.006cm^3 volume. There is muscle and fat exposed. There is tunneling at 12:00 with a maximum distance of 4.9cm. There is a large amount of purulent drainage noted. The wound margin is flat and intact. There is large (67-100%) pink granulation within the wound bed. There is a small (1-33%) amount of necrotic tissue within the wound bed including Adherent Slough. The periwound skin appearance exhibited: Localized Edema, Maceration, Moist, Rubor, Erythema. The periwound skin appearance did not exhibit: Callus, Crepitus, Excoriation, Fluctuance, Friable, Induration, Rash, Scarring, Dry/Scaly, Atrophie Blanche, [name], [name]  ([id number]) Cyanosis, Ecchymosis, Hemosiderin Staining, Mottled, Pallor. The surrounding wound skin color is noted with erythema which is circumferential. Periwound temperature was noted as Hot. The periwound has tenderness on palpation. 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 There was a Skin/Subcutaneous Tissue Debridement ([id number]) debridement with total area of 0.6 sq cm performed by Katelyn Caul, MD. with the following instrument(s): scoop to remove Viable and Non-Viable tissue/material including Exudate, Fibrin/Slough, and Subcutaneous after achieving pain control using Lidocaine 4% Topical Solution. A time out was conducted at 08:26, prior to the start of the procedure. A Minimum amount of bleeding was controlled with Pressure. The procedure was tolerated well with a pain level of 0 throughout and a pain level of 0 following the procedure. Post Debridement Measurements: 1cm length x 0.6cm width x 6.5cm depth; 3.063cm^3 volume. Post debridement Stage noted as Category/Stage IV. Character of Wound/Ulcer Post Debridement requires further debridement. Severity of Tissue Post Debridement is: Fat layer exposed. Post procedure Diagnosis Wound #3: Same as Pre-Procedure #1 using a curette debrided the sinuses/tunnell on the right slough and nonviable subcutaneous tissue. Attempting to stimulate some closure currently the depth of this is not improving. There was no bleeding she tolerated this well Plan [name], [name] ([id number]) Wound Cleansing: Wound #3 Right Gluteal fold: Clean wound with Normal Saline. Cleanse  wound with mild soap and water Wound #4 Left Gluteal fold: Clean wound with Normal Saline. Cleanse wound with mild soap and water Wound #5 Left,Posterior Knee: Clean wound with Normal Saline. Cleanse wound with mild soap and water Anesthetic: Wound #3 Right Gluteal fold: Topical Lidocaine 4% cream applied to wound bed prior to debridement - clinic use only Wound #4 Left Gluteal fold: Topical Lidocaine 4% cream applied to wound bed prior to debridement - clinic use only Wound #5 Left,Posterior Knee: Topical Lidocaine 4% cream applied to wound bed prior to debridement - clinic use only Skin Barriers/Peri-Wound Care: Wound #3 Right Gluteal fold: Skin Prep Wound #4 Left Gluteal fold: Skin Prep Wound #5 Left,Posterior Knee: Skin Prep Primary Wound Dressing: Wound #3 Right  Gluteal fold: Aquacel Ag - or equivalent to Silver Alginate Rope pack lightly into wound and leave a tail where it can be pulled out and to cover the outside of the wound. Wound #4 Left Gluteal fold: Aquacel Ag - Silver Alginate Wound #5 Left,Posterior Knee: Iodoform packing Gauze - Pack lightly into wound at 12 o'clock and leave a tail where it can be pulled out and to cover the outside of the wound, pack in to wound at as far as you can to keep the tunnel open. Secondary Dressing: Wound #3 Right Gluteal fold: Dry Gauze Non-adherent pad - Telfa island, tape, or BFD Wound #4 Left Gluteal fold: Dry Gauze Non-adherent pad - Telfa island, tape, or BFD Wound #5 Left,Posterior Knee: Dry Gauze Non-adherent pad - Telfa island, tape, or BFD Dressing Change Frequency: Wound #3 Right Gluteal fold: Change dressing every other day. Wound #4 Left Gluteal fold: Change dressing every other day. Wound #5 Left,Posterior Knee: Change dressing every other day. Meng, Mikeyla L. ([id number]) Follow-up Appointments: Wound #3 Right Gluteal fold: Return Appointment in 1 week. Wound #4 Left Gluteal fold: Return Appointment in 1  week. Wound #5 Left,Posterior Knee: Return Appointment in 1 week. Off-Loading: Wound #3 Right Gluteal fold: Roho cushion for wheelchair - *********[facility] to order*********** Turn and reposition every 2 hours - Museum/gallery curator *********[facility] to order*********** Wound #4 Left Gluteal fold: Roho cushion for wheelchair - *********[facility] to order*********** Mattress - Museum/gallery curator *********[facility] to order*********** Turn and reposition every 2 hours Wound #5 Left,Posterior Knee: Roho cushion for wheelchair - *********[facility] to order*********** Mattress - Museum/gallery curator *********[facility] to order*********** Turn and reposition every 2 hours Home Health: Wound #3 Right Gluteal fold: Continue Home Health Visits - [facility] *****Please order ROHO cushion and air mattress****** Home Health Nurse may visit PRN to address patient s wound care needs. FACE TO FACE ENCOUNTER: MEDICARE and MEDICAID PATIENTS: I certify that this patient is under my care and that I had a face-to-face encounter that meets the physician face-to-face encounter requirements with this patient on this date. The encounter with the patient was in whole or in part for the following MEDICAL CONDITION: (primary reason for Home Healthcare) MEDICAL NECESSITY: I certify, that based on my findings, NURSING services are a medically necessary home health service. HOME BOUND STATUS: I certify that my clinical findings support that this patient is homebound (i.e., Due to illness or injury, pt requires aid of supportive devices such as crutches, cane, wheelchairs, walkers, the use of special transportation or the assistance of another person to leave their place of residence. There is a normal inability to leave the home and doing so requires considerable and taxing effort. Other absences are for medical reasons / religious services and are infrequent or of short duration when  for other reasons). If current dressing causes regression in wound condition, may D/C ordered dressing product/s and apply Normal Saline Moist Dressing daily until next Wound Healing Center / Other MD appointment. Notify Wound Healing Center of regression in wound condition at [phone]. Please direct any NON-WOUND related issues/requests for orders to patient's Primary Care Physician Wound #4 Left Gluteal fold: Continue Home Health Visits - [facility] *****Please order ROHO cushion and air mattress****** Home Health Nurse may visit PRN to address patient s wound care needs. FACE TO FACE ENCOUNTER: MEDICARE and MEDICAID PATIENTS: I certify that this patient is under my care and that  I had a face-to-face encounter that meets the physician face-to-face encounter requirements with this patient on this date. The encounter with the patient was in whole or in part for the following MEDICAL CONDITION: (primary reason for Home Healthcare) MEDICAL NECESSITY: I certify, that based on my findings, NURSING services are a medically necessary home health service. HOME BOUND STATUS: I certify that my clinical findings support that this patient is homebound (i.e., Due to illness or injury, pt requires aid of supportive devices such as crutches, cane, wheelchairs, walkers, the use of special transportation or the assistance of another person to leave their place of residence. There is a normal inability to leave the home and doing so requires considerable and taxing effort. Other absences are Lamia, Ethelmae L. ([id number]) for medical reasons / religious services and are infrequent or of short duration when for other reasons). If current dressing causes regression in wound condition, may D/C ordered dressing product/s and apply Normal Saline Moist Dressing daily until next Wound Healing Center / Other MD appointment. Notify Wound Healing Center of regression in wound condition at  [phone]. Please direct any NON-WOUND related issues/requests for orders to patient's Primary Care Physician Wound #5 Left,Posterior Knee: [facility] *****Please order ROHO cushion and air mattress****** Home Health Nurse may visit PRN to address patient s wound care needs. FACE TO FACE ENCOUNTER: MEDICARE and MEDICAID PATIENTS: I certify that this patient is under my care and that I had a face-to-face encounter that meets the physician face-to-face encounter requirements with this patient on this date. The encounter with the patient was in whole or in part for the following MEDICAL CONDITION: (primary reason for Home Healthcare) MEDICAL NECESSITY: I certify, that based on my findings, NURSING services are a medically necessary home health service. HOME BOUND STATUS: I certify that my clinical findings support that this patient is homebound (i.e., Due to illness or injury, pt requires aid of supportive devices such as crutches, cane, wheelchairs, walkers, the use of special transportation or the assistance of another person to leave their place of residence. There is a normal inability to leave the home and doing so requires considerable and taxing effort. Other absences are for medical reasons / religious services and are infrequent or of short duration when for other reasons). If current dressing causes regression in wound condition, may D/C ordered dressing product/s and apply Normal Saline Moist Dressing daily until next Wound Healing Center / Other MD appointment. Notify Wound Healing Center of regression in wound condition at [phone]. Please direct any NON-WOUND related issues/requests for orders to patient's Primary Care Physician Medications-please add to medication list.: Wound #3 Right Gluteal fold: Other: - Vitamin C, Zinc, Multivitamin Wound #4 Left Gluteal fold: Other: - Vitamin C, Zinc, Multivitamin Other: - Vitamin C, Zinc,  Multivitamin IV Antibiotics Wound #5 Left,Posterior Knee: Other: - Vitamin C, Zinc, Multivitamin IV Antibiotics #1 no change to current dressings iodoform packing to the sinus in the right popliteal fossa, this is improved although there is still sanguenous drainage #2 silver alginate to the remaining open area on the right gluteal, this appears to be closing #3silver alginate rope to the sinus over ther right ischial tuberosity. In terms of longterm outcome, this is the most worriesome area #4. underlying osteo in both areas hopefully is resolved/resolving Electronic Signature(s) Katelyn GeorgesWARD, Ryhanna L. ([id number]) Signed: 11/30/2016 4:39:43 PM By: Katelyn Najjarobson, Sophya Vanblarcom MD Entered By: Katelyn Najjarobson, Pierina Schuknecht on 11/30/2016 09:04:49 Nila, Kailiana L. ([id number]) -------------------------------------------------------------------------------- SuperBill Details  Patient Name: DERICKA, OSTENSON Date of Service: 11/30/2016 Medical Record Patient Account Number: [id number] Number: Treating RN: [name] Nov 07, 1961 (55 y.o. 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

## 2016-12-01 NOTE — Progress Notes (Signed)
KAMERA, DUBAS ([id number]) Visit Report for 11/30/2016 Arrival Information Details Patient Name: [name], [name] Date of Service: 11/30/2016 8:00 AM Medical Record Patient Account Number: [id number] Number: Treating RN: Ahmed Prima August 17, 1961 (55 y.o. Other Clinician: Date of Birth/Sex: Female) Treating ROBSON, MICHAEL Primary Care Physician: Lavera Guise Physician/Extender: G Referring Physician: Sallee Lange in Treatment: 10 Visit Information History Since Last Visit All ordered tests and consults were completed: No Patient Arrived: Wheel Chair Added or deleted any medications: No Arrival Time: 08:03 Any new allergies or adverse reactions: No Accompanied By: self Had a fall or experienced change in No Transfer Assistance: EasyP[employer] activities of daily living that may affect Patient Lift risk of falls: Patient Identification Verified: Yes Signs or symptoms of abuse/neglect since last No Secondary Verification Process Yes visito Completed: Hospitalized since last visit: No Patient Requires Transmission- No Pain Present Now: No Based Precautions: Patient Has Alerts: Yes Patient Alerts: DM II Electronic Signature(s) Signed: 11/30/2016 4:44:01 PM By: Alric Quan Entered By: Alric Quan on 11/30/2016 08:06:42 Yeaman, Candance Carlean Jews ([id number]) -------------------------------------------------------------------------------- Encounter Discharge Information Details Patient Name: [name] Date of Service: 11/30/2016 8:00 AM Medical Record Patient Account Number: [id number] Number: Treating RN: Ahmed Prima 1961/11/26 (55 y.o. Other Clinician: Date of Birth/Sex: Female) Treating ROBSON, MICHAEL Primary Care Physician: Lavera Guise Physician/Extender: G Referring Physician: Sallee Lange in Treatment: 10 Encounter Discharge Information Items Discharge Pain Level: 0 Discharge Condition: Stable Ambulatory Status:  Wheelchair Discharge Destination: Home Transportation: Private Auto Accompanied By: self Schedule Follow-up Appointment: Yes Medication Reconciliation completed and provided to Patient/Care Yes : Provided on Clinical Summary of Care: 11/30/2016 Form Type Recipient Paper Patient TW Electronic Signature(s) Signed: 11/30/2016 8:56:00 AM By: Ruthine Dose Entered By: Ruthine Dose on 11/30/2016 08:56:00 [place], [name] ([id number]) -------------------------------------------------------------------------------- Lower Extremity Assessment Details Patient Name: [name] Date of Service: 11/30/2016 8:00 AM Medical Record Patient Account Number: [id number] Number: Treating RN: Ahmed Prima 1961-02-13 (55 y.o. Other Clinician: Date of Birth/Sex: Female) Treating ROBSON, MICHAEL Primary Care Physician: Lavera Guise Physician/Extender: G Referring Physician: Sallee Lange in Treatment: 10 Electronic Signature(s) Signed: 11/30/2016 4:44:01 PM By: Alric Quan Entered By: Alric Quan on 11/30/2016 08:07:34 [place], [name] ([id number]) -------------------------------------------------------------------------------- Multi Wound Chart Details Patient Name: [name] Date of Service: 11/30/2016 8:00 AM Medical Record Patient Account Number: [id number] Number: Treating RN: Ahmed Prima 1961-08-02 (55 y.o. Other Clinician: Date of Birth/Sex: Female) Treating ROBSON, MICHAEL Primary Care Physician: Lavera Guise Physician/Extender: G Referring Physician: Sallee Lange in Treatment: 10 Vital Signs Height(in): 63 Pulse(bpm): 86 Weight(lbs): 257 Blood Pressure 149/63 (mmHg): Body Mass Index(BMI): 46 Temperature(F): 98.2 Respiratory Rate 18 (breaths/min): Photos: [3:No Photos] [4:No Photos] [5:No Photos] Wound Location: [3:Right Gluteal fold] [4:Left Gluteal fold] [5:Left Knee - Posterior] Wounding Event: [3:Pressure Injury]  [4:Pressure Injury] [5:Gradually Appeared] Primary Etiology: [3:Pressure Ulcer] [4:Pressure Ulcer] [5:Diabetic Wound/Ulcer of the Lower Extremity] Comorbid History: [3:Asthma, Hypertension, Type II Diabetes, Neuropathy] [4:Asthma, Hypertension, Type II Diabetes, Neuropathy] [5:Asthma, Hypertension, Type II Diabetes, Neuropathy] Date Acquired: [3:04/21/2016] [4:04/21/2016] [5:07/15/2016] Weeks of Treatment: [3:10] [4:10] [5:10] Wound Status: [3:Open] [4:Open] [5:Open] Measurements L x W x D 1x0.6x6.5 [4:0.4x1.8x0.1] [5:0.2x0.2x0.2] (cm) Area (cm) : [3:0.471] [4:0.565] [5:0.031] Volume (cm) : [3:3.063] [4:0.057] [5:0.006] % Reduction in Area: [3:84.70%] [4:92.70%] [5:93.70%] % Reduction in Volume: 82.20% [4:99.60%] [5:99.90%] Position 1 (o'clock): [5:12] Maximum Distance 1 [5:4.9] (cm): Tunneling: [3:No] [4:No] [5:Yes] Classification: [3:Category/Stage IV] [4:Category/Stage IV] [5:Grade 2] Exudate Amount: [3:Large] [4:Large] [5:Large] Exudate Type: [3:Purulent] [4:Serous] [  5:Purulent] Exudate Color: [3:yellow, brown, green] [4:amber] [5:yellow, brown, green] Foul Odor After [3:Yes] [4:Yes] [5:Yes] Cleansing: [3:No] [4:No] [5:No] Odor Anticipated Due to Product Use: Wound Margin: Distinct, outline attached Distinct, outline attached Flat and Intact Granulation Amount: Small (1-33%) Large (67-100%) Large (67-100%) Granulation Quality: Pink, Pale Red, Pink Pink Necrotic Amount: Large (67-100%) Small (1-33%) Small (1-33%) Exposed Structures: Fascia: Yes Fat: Yes Fat: Yes Fat: Yes Muscle: Yes Muscle: Yes Muscle: Yes Fascia: No Fascia: No Tendon: No Tendon: No Tendon: No Joint: No Joint: No Bone: No Bone: No Epithelialization: None Medium (34-66%) None Periwound Skin Texture: No Abnormalities Noted No Abnormalities Noted Edema: Yes Excoriation: No Induration: No Callus: No Crepitus: No Fluctuance: No Friable: No Rash: No Scarring: No Periwound Skin Maceration:  Yes Maceration: Yes Maceration: Yes Moisture: Moist: Yes Moist: Yes Moist: Yes Dry/Scaly: No Periwound Skin Color: No Abnormalities Noted No Abnormalities Noted Erythema: Yes Rubor: Yes Atrophie Blanche: No Cyanosis: No Ecchymosis: No Hemosiderin Staining: No Mottled: No Pallor: No Erythema Location: N/A N/A Circumferential Temperature: No Abnormality No Abnormality Hot Tenderness on Yes Yes Yes Palpation: Wound Preparation: Ulcer Cleansing: Ulcer Cleansing: Ulcer Cleansing: Rinsed/Irrigated with Rinsed/Irrigated with Rinsed/Irrigated with Saline Saline Saline Topical Anesthetic Topical Anesthetic Topical Anesthetic Applied: Other: lidocaine Applied: Other: lidocaine Applied: Other: lidocaine 4% 4% 4% Treatment Notes Electronic Signature(s) Signed: 11/30/2016 4:44:01 PM By: Vivi Ferns, Meganne Carlean Jews ([id number]) Entered By: Alric Quan on 11/30/2016 08:21:31 [place]amarron, [name] ([id number]) -------------------------------------------------------------------------------- [place] Details Patient Name: [name] Date of Service: 11/30/2016 8:00 AM Medical Record Patient Account Number: [id number] Number: Treating RN: Ahmed Prima 1961/07/04 (55 y.o. Other Clinician: Date of Birth/Sex: Female) Treating ROBSON, [place] Primary Care Physician: Lavera Guise Physician/Extender: G Referring Physician: Sallee Lange in Treatment: 10 Active Inactive Abuse / Safety / Falls / Self Care Management Nursing Diagnoses: Potential for falls Goals: Patient will remain injury free Date Initiated: 09/21/2016 Goal Status: Active Interventions: Assess fall risk on admission and as needed Notes: Nutrition Nursing Diagnoses: Imbalanced nutrition Potential for alteratiion in Nutrition/Potential for imbalanced nutrition Goals: Patient/caregiver agrees to and verbalizes understanding of need to use nutritional supplements  and/or vitamins as prescribed Date Initiated: 09/21/2016 Goal Status: Active Patient/caregiver verbalizes understanding of need to maintain therapeutic glucose control per primary care physician Date Initiated: 09/21/2016 Goal Status: Active Interventions: Assess patient nutrition upon admission and as needed per policy Notes: Kable, [place] ([id number]) Orientation to the Wound Care Program Nursing Diagnoses: Knowledge deficit related to the wound healing center program Goals: Patient/caregiver will verbalize understanding of the [place] Date Initiated: 09/21/2016 Goal Status: Active Interventions: Provide education on orientation to the wound center Notes: Pain, Acute or Chronic Nursing Diagnoses: Pain, acute or chronic: actual or potential Potential alteration in comfort, pain Goals: Patient will verbalize adequate pain control and receive pain control interventions during procedures as needed Date Initiated: 09/21/2016 Goal Status: Active Patient/caregiver will verbalize adequate pain control between visits Date Initiated: 09/21/2016 Goal Status: Active Patient/caregiver will verbalize comfort level met Date Initiated: 09/21/2016 Goal Status: Active Interventions: Assess comfort goal upon admission Complete pain assessment as per visit requirements Notes: Wound/Skin Impairment Nursing Diagnoses: Impaired tissue integrity Goals: Ulcer/skin breakdown will have a volume reduction of 30% by week 4 Date Initiated: 09/21/2016 GLENYCE, RANDLE ([id number]) Goal Status: Active Ulcer/skin breakdown will have a volume reduction of 50% by week 8 Date Initiated: 09/21/2016 Goal Status: Active Ulcer/skin breakdown will have a volume reduction of 80% by week 12 Date  Initiated: 09/21/2016 Goal Status: Active Interventions: Assess ulceration(s) every visit Notes: Electronic Signature(s) Signed: 11/30/2016 4:44:01 PM By: Alric Quan Entered By: Alric Quan on 11/30/2016 08:21:24 Payment, Lativia Carlean Jews ([id number]) -------------------------------------------------------------------------------- Pain Assessment Details Patient Name: [name] Date of Service: 11/30/2016 8:00 AM Medical Record Patient Account Number: [id number] Number: Treating RN: Ahmed Prima December 25, 1961 (55 y.o. Other Clinician: Date of Birth/Sex: Female) Treating ROBSON, MICHAEL Primary Care Physician: Lavera Guise Physician/Extender: G Referring Physician: Sallee Lange in Treatment: 10 Active Problems Location of Pain Severity and Description of Pain Patient Has Paino No Site Locations With Dressing Change: No Pain Management and Medication Current Pain Management: Electronic Signature(s) Signed: 11/30/2016 4:44:01 PM By: Alric Quan Entered By: Alric Quan on 11/30/2016 08:06:48 [place], [name] ([id number]) -------------------------------------------------------------------------------- Patient/Caregiver Education Details Patient Name: [name] Date of Service: 11/30/2016 8:00 AM Medical Record Patient Account Number: [id number] Number: Treating RN: Ahmed Prima 05-22-1961 (55 y.o. Other Clinician: Date of Birth/Gender: Female) Treating ROBSON, MICHAEL Primary Care Physician: Lavera Guise Physician/Extender: G Referring Physician: Sallee Lange in Treatment: 10 Education Assessment Education Provided To: Patient Education Topics Provided Wound/Skin Impairment: Handouts: Other: change dressing as ordered Methods: Demonstration, Explain/Verbal Responses: State content correctly Electronic Signature(s) Signed: 11/30/2016 4:44:01 PM By: Alric Quan Entered By: Alric Quan on 11/30/2016 08:31:59 [place]ozo, Kiyona Carlean Jews ([id number]) -------------------------------------------------------------------------------- Wound Assessment Details Patient Name: [name] Date of Service: 11/30/2016 8:00  AM Medical Record Patient Account Number: [id number] Number: Treating RN: Ahmed Prima 27-Jan-1961 (55 y.o. Other Clinician: Date of Birth/Sex: Female) Treating ROBSON, [place] Primary Care Physician: Lavera Guise Physician/Extender: G Referring Physician: Sallee Lange in Treatment: 10 Wound Status Wound Number: 3 Primary Pressure Ulcer Etiology: Wound Location: Right Gluteal fold Wound Status: Open Wounding Event: Pressure Injury Comorbid Asthma, Hypertension, Type II Date Acquired: 04/21/2016 History: Diabetes, Neuropathy Weeks Of Treatment: 10 Clustered Wound: No Photos Photo Uploaded By: Alric Quan on 11/30/2016 08:55:42 Wound Measurements Length: (cm) 1 Width: (cm) 0.6 Depth: (cm) 6.5 Area: (cm) 0.471 Volume: (cm) 3.063 % Reduction in Area: 84.7% % Reduction in Volume: 82.2% Epithelialization: None Tunneling: No Undermining: No Wound Description Classification: Category/Stage IV Wound Margin: Distinct, outline attached Exudate Amount: Large Exudate Type: Purulent Exudate Color: yellow, brown, green Foul Odor After Cleansing: Yes Due to Product Use: No Wound Bed Granulation Amount: Small (1-33%) Exposed Structure Granulation Quality: Pink, Pale Fascia Exposed: Yes Necrotic Amount: Large (67-100%) Fat Layer Exposed: Yes Varma, Zauria L. ([id number]) Necrotic Quality: Adherent Slough Tendon Exposed: No Muscle Exposed: Yes Necrosis of Muscle: No Joint Exposed: No Bone Exposed: No Periwound Skin Texture Texture Color No Abnormalities Noted: No No Abnormalities Noted: No Moisture Temperature / Pain No Abnormalities Noted: No Temperature: No Abnormality Maceration: Yes Tenderness on Palpation: Yes Moist: Yes Wound Preparation Ulcer Cleansing: Rinsed/Irrigated with Saline Topical Anesthetic Applied: Other: lidocaine 4%, Treatment Notes Wound #3 (Right Gluteal fold) 1. Cleansed with: Clean wound with Normal Saline 2.  Anesthetic Topical Lidocaine 4% cream to wound bed prior to debridement 3. Peri-wound Care: Skin Prep 4. Dressing Applied: Aquacel Ag 5. Secondary Dressing Applied Dry [place] Signature(s) Signed: 11/30/2016 4:44:01 PM By: Alric Quan Entered By: Alric Quan on 11/30/2016 08:20:11 [place], [name] ([id number]) -------------------------------------------------------------------------------- Wound Assessment Details Patient Name: [name] Date of Service: 11/30/2016 8:00 AM Medical Record Patient Account Number: [id number] Number: Treating RN: Ahmed Prima [birth year] (55 y.o. Other Clinician: Date of Birth/Sex: Female) Treating ROBSON, [place] Primary Care Physician: Lavera Guise  Physician/Extender: G Referring Physician: Sallee Lange in Treatment: 10 Wound Status Wound Number: 4 Primary Pressure Ulcer Etiology: Wound Location: Left Gluteal fold Wound Status: Open Wounding Event: Pressure Injury Comorbid Asthma, Hypertension, Type II Date Acquired: 04/21/2016 History: Diabetes, Neuropathy Weeks Of Treatment: 10 Clustered Wound: No Photos Photo Uploaded By: Alric Quan on 11/30/2016 08:55:43 Wound Measurements Length: (cm) 0.4 Width: (cm) 1.8 Depth: (cm) 0.1 Area: (cm) 0.565 Volume: (cm) 0.057 % Reduction in Area: 92.7% % Reduction in Volume: 99.6% Epithelialization: Medium (34-66%) Tunneling: No Undermining: No Wound Description Classification: Category/Stage IV Wound Margin: Distinct, outline attached Exudate Amount: Large Exudate Type: Serous Exudate Color: amber Foul Odor After Cleansing: Yes Due to Product Use: No Wound Bed Granulation Amount: Large (67-100%) Exposed Structure Granulation Quality: Red, Pink Fascia Exposed: No Necrotic Amount: Small (1-33%) Fat Layer Exposed: Yes Sposito, Magdalyn L. ([id number]) Necrotic Quality: Adherent Slough Tendon Exposed: No Muscle Exposed: Yes Necrosis of  Muscle: No Periwound Skin Texture Texture Color No Abnormalities Noted: No No Abnormalities Noted: No Moisture Temperature / Pain No Abnormalities Noted: No Temperature: No Abnormality Maceration: Yes Tenderness on Palpation: Yes Moist: Yes Wound Preparation Ulcer Cleansing: Rinsed/Irrigated with Saline Topical Anesthetic Applied: Other: lidocaine 4%, Treatment Notes Wound #4 (Left Gluteal fold) 1. Cleansed with: Clean wound with Normal Saline 2. Anesthetic Topical Lidocaine 4% cream to wound bed prior to debridement 3. Peri-wound Care: Skin Prep 4. Dressing Applied: Aquacel Ag 5. Secondary Dressing Applied Dry [place] Signature(s) Signed: 11/30/2016 4:44:01 PM By: Alric Quan Entered By: Alric Quan on 11/30/2016 08:17:56 [place], [name] ([id number]) -------------------------------------------------------------------------------- Wound Assessment Details Patient Name: [name] Date of Service: 11/30/2016 8:00 AM Medical Record Patient Account Number: [id number] Number: Treating RN: Ahmed Prima 02/25/1961 (55 y.o. Other Clinician: Date of Birth/Sex: Female) Treating ROBSON, MICHAEL Primary Care Physician: Lavera Guise Physician/Extender: G Referring Physician: Sallee Lange in Treatment: 10 Wound Status Wound Number: 5 Primary Diabetic Wound/Ulcer of the Lower Etiology: Extremity Wound Location: Left Knee - Posterior Wound Status: Open Wounding Event: Gradually Appeared Comorbid Asthma, Hypertension, Type II Date Acquired: 07/15/2016 History: Diabetes, Neuropathy Weeks Of Treatment: 10 Clustered Wound: No Photos Photo Uploaded By: Alric Quan on 11/30/2016 08:55:56 Wound Measurements Length: (cm) 0.2 Width: (cm) 0.2 Depth: (cm) 0.2 Area: (cm) 0.031 Volume: (cm) 0.006 % Reduction in Area: 93.7% % Reduction in Volume: 99.9% Epithelialization: None Tunneling: Yes Position (o'clock):  12 Maximum Distance: (cm) 4.9 Wound Description Classification: Grade 2 Wound Margin: Flat and Intact Exudate Amount: Large Exudate Type: Purulent Exudate Color: yellow, brown, green Foul Odor After Cleansing: Yes Due to Product Use: No Wound Bed Granulation Amount: Large (67-100%) Exposed Structure Bagot, Coraline L. ([id number]) Granulation Quality: Pink Fascia Exposed: No Necrotic Amount: Small (1-33%) Fat Layer Exposed: Yes Necrotic Quality: Adherent Slough Tendon Exposed: No Muscle Exposed: Yes Necrosis of Muscle: No Joint Exposed: No Bone Exposed: No Periwound Skin Texture Texture Color No Abnormalities Noted: No No Abnormalities Noted: No Callus: No Atrophie Blanche: No Crepitus: No Cyanosis: No Excoriation: No Ecchymosis: No Fluctuance: No Erythema: Yes Friable: No Erythema Location: Circumferential Induration: No Hemosiderin Staining: No Localized Edema: Yes Mottled: No Rash: No Pallor: No Scarring: No Rubor: Yes Moisture Temperature / Pain No Abnormalities Noted: No Temperature: Hot Dry / Scaly: No Tenderness on Palpation: Yes Maceration: Yes Moist: Yes Wound Preparation Ulcer Cleansing: Rinsed/Irrigated with Saline Topical Anesthetic Applied: Other: lidocaine 4%, Treatment Notes Wound #5 (Left, Posterior Knee) 1. Cleansed with: Clean wound with Normal Saline 2.  Anesthetic Topical Lidocaine 4% cream to wound bed prior to debridement 3. Peri-wound Care: Skin Prep 4. Dressing Applied: Iodoform packing Gauze 5. Secondary Dressing Applied Dry [place] Signature(s) Signed: 11/30/2016 4:44:01 PM By: Vivi Ferns, Kaylee Carlean Jews ([id number]) Entered By: Alric Quan on 11/30/2016 08:16:56 [place], [name] ([id number]) -------------------------------------------------------------------------------- [place] Details Patient Name: [name] Date of Service: 11/30/2016 8:00 AM Medical Record Patient Account Number:  [id number] Number: Treating RN: Ahmed Prima [birth year] (55 y.o. Other Clinician: Date of Birth/Sex: Female) Treating ROBSON, MICHAEL Primary Care Physician: Lavera Guise Physician/Extender: G Referring Physician: Sallee Lange in Treatment: 10 Vital Signs Time Taken: 08:06 Temperature (F): 98.2 Height (in): 63 Pulse (bpm): 86 Weight (lbs): 257 Respiratory Rate (breaths/min): 18 Body Mass Index (BMI): 45.5 Blood Pressure (mmHg): 149/63 Reference Range: 80 - 120 mg / dl Electronic Signature(s) Signed: 11/30/2016 4:44:01 PM By: Alric Quan Entered By: Alric Quan on 11/30/2016 08:07:08

## 2016-12-07 ENCOUNTER — Encounter: Payer: Managed Care, Other (non HMO) | Admitting: Internal Medicine

## 2016-12-07 DIAGNOSIS — L89314 Pressure ulcer of right buttock, stage 4: Secondary | ICD-10-CM | POA: Diagnosis not present

## 2016-12-08 NOTE — Progress Notes (Addendum)
NIRALI, MAGOUIRK ([id number]) Visit Report for 12/07/2016 Chief Complaint Document Details Patient Name: [name], [name] Date of Service: 12/07/2016 8:00 AM Medical Record Patient Account Number: [id number] Number: Treating RN: Phillis Haggis [birth year] (55 y.o. Other Clinician: Date of Birth/Sex: Female) Treating ROBSON, MICHAEL Primary Care Physician/Extender: Leticia Penna Physician: Referring Physician: Tedra Senegal in Treatment: 11 Information Obtained from: Patient Chief Complaint Ms. Cryder presents today for routine managament of bilateral ischial pressure ulcers and sinus tract to left popliteal fossa. Electronic Signature(s) Signed: 12/07/2016 4:39:00 PM By: Baltazar Najjar MD Entered By: Baltazar Najjar on 12/07/2016 08:33:45 [name], [name] ([id number]) -------------------------------------------------------------------------------- Debridement Details Patient Name: [name] Date of Service: 12/07/2016 8:00 AM Medical Record Patient Account Number: [id number] Number: Treating RN: Phillis Haggis 30-May-1961 (55 y.o. Other Clinician: Date of Birth/Sex: Female) Treating ROBSON, MICHAEL Primary Care Physician/Extender: [name], [name] Physician: Referring Physician: Tedra Senegal in Treatment: 11 Debridement Performed for Wound #3 Right Gluteal fold Assessment: Performed By: Physician Maxwell Caul, MD Debridement: Debridement Pre-procedure Yes - 08:27 Verification/Time Out Taken: Start Time: 08:28 Pain Control: Lidocaine 4% Topical Solution Level: Skin/Subcutaneous Tissue Total Area Debrided (L x 1 (cm) x 0.6 (cm) = 0.6 (cm) W): Tissue and other Viable, Non-Viable, Exudate, Fibrin/Slough, Subcutaneous material debrided: Instrument: Other : scoop Bleeding: Minimum Hemostasis Achieved: Pressure End Time: 08:30 Procedural Pain: 0 Post Procedural Pain: 0 Response to Treatment: Procedure was tolerated well Post  Debridement Measurements of Total Wound Length: (cm) 1 Stage: Category/Stage IV Width: (cm) 0.6 Depth: (cm) 7.1 Volume: (cm) 3.346 Character of Wound/Ulcer Post Requires Further Debridement: Debridement Severity of Tissue Post Fat layer exposed Debridement: Post Procedure Diagnosis Same as Pre-procedure Electronic Signature(s) JORDYNN, MARCELLA ([id number]) Signed: 12/07/2016 1:16:38 PM By: Alejandro Mulling Signed: 12/07/2016 4:39:00 PM By: Baltazar Najjar MD Entered By: Alejandro Mulling on 12/07/2016 08:34:41 [name], [name] ([id number]) -------------------------------------------------------------------------------- HPI Details Patient Name: [name] Date of Service: 12/07/2016 8:00 AM Medical Record Patient Account Number: [id number] Number: Treating RN: Phillis Haggis 1961/01/25 (55 y.o. Other Clinician: Date of Birth/Sex: Female) Treating ROBSON, MICHAEL Primary Care Physician/Extender: [name], [name] Physician: Referring Physician: Tedra Senegal in Treatment: 11 History of Present Illness Location: bilateral gluteal ulcerations and left popliteal ulceration with tunneling Quality: adits to intermittent aching to ischial ulcers, no discomfort to sinus tract Severity: right iscial ulcer with increased depth Duration: chronic ulcers to bilateral ischial ulcers and sinus tract Timing: The pain is intermittent in severity as far as how intense it becomes but is present all the time. Manipulationn makes this worse. Context: The wound occurred when the patient had a fall and was unconscious for about 48 hours laying on the floor and she had pressure injury at that stage. Modifying Factors: Other treatment(s) tried include:as noted below she has been seen by visiting wound care physicians or nurse practitioners and details have been noted Associated Signs and Symptoms: has yet to receive offloading cushions and mattress overlay HPI Description: 55 year old  patient who was seen by visiting [name] wound care specialist for a wound on both her buttock and was found to have an unstageable wound on the right buttock for about 2 months. I understand that she had a fall and was laying on the floor for about 48 hours before she was found and taken to the ICU and had a long injury to her gluteal area from pressure and also had broken her right humerus. She has had a right proximal humerus  fracture and has been followed up with orthopedics recently. The patient has a past medical history of type 2 diabetes mellitus, paraparesis, acute pyelonephritis, GERD, hypertension, glaucoma, chronic pain, anxiety neurosis, nicotine dependence, COPD. the patient had some debridement done and was to operative was recommended to use Silvadene dressing and offloading. She is a smoker and occasionally smokes a few cigarettes. the patient requested a second opinion for months and is here to discuss her care. 09/21/16; the patient re-presents from home today for review of 3 different wounds. I note that she was seen in the clinic here in July at which time she had bilateral buttock wounds. It was apparently suggested at that time that she use a wound VAC bridged to both wounds just near the initial tuberosity's bilaterally which she refused. The history was a bit difficult to put together. Apparently this patient became ill at the end of April of this year. She was found sitting on the floor she had apparently been for 2 days and subsequently admitted to hospital from 04/23/16 through 05/02/16 and at that point she was critically ill ultimately having sepsis secondary to UTI, nontraumatic rhabdomyolysis and diabetic ketoacidosis. She had acute renal failure and I think required ICU care including intubation. Patient states her wounds actually started at that point on the bilateral issue tuberosities however in reviewing the discharge summary from 5/8 I see no reference to wounds  at that point. It did state that she had left lower extremity cellulitis however. Reviewing Epic I see no relevant x-rays. It would appear that her discharge creatinine was within the normal range and indeed on [name], [name] ([id number]) 9/15 her creatinine has remained normal. [facility] There the wounds on her bilateral Botox were dressed. Only just before her discharge from the nursing facility she developed an "knot" which was interpreted as cellulitis on the posterior aspect of her left knee she was given antibiotics. Apparently sometime late in July a this actually opened and became a wound at home health care was tending to however she is still having purulent drainage coming from this and by my understanding the wound depth is actually become unmeasurable. I am not really clear about what home health has been placing in any of these wound areas. The patient states that is something with silver and it. She is not been systemically unwell no fever or chills her appetite is good. She is a diabetic poorly controlled however she states that her recent blood sugars at home have been in the low to mid 100s. 09/28/16 On evaluation today patient appears to continue to exhibit the 3 areas of ulceration that were noted previous. She did have an x-ray of the right pelvis which showed evidence of potential soft tissue infection but no obvious osteomyelitis. There was a discussion last office visit concerning the possibility of a wound VAC. Witth that being said the x-ray report suggested that an MRI may be more appropriate to further evaluate the extent. Subsequently in regard to the wound over the popliteal portion of the left lower extremity with tunneling at 12:00 the CT scan that was ordered was denied by insurance as they state the patient has not had x-rays prior to advanced imaging. Patient states that she is frustrated with the  situation overall. 10/05/16 in the interval since I last saw this patient last week she has had the x-ray of the knee performed. I did review that x-ray today and fortunately  shows no evidence of osteomyelitis or other acute abnormality at this point in time. She continues to have the opening iin the posterior oral popliteal space with tracking proximal up the posterior thigh. Nothing seems to have worsened but it also seems to have not improved. The same is true in regard to the right pressure ulcer over the gluteal region which extends toward the ischium. The left gluteal pressure ulcer actually appears to be doing somewhat better my opinion there is some necrotic slough but overall this appears fairly well. She tells me thatt she has some discomfort especially when home health is helping her with dressing changes as they do not know her. At worse she rates her pain to be a 5 out of 10 right now it's more like a 1 out of 10. 10/07/16; still the patient has 3 different wound areas. She has a deep stage IV wound over her right ischial tuberosity. She is due to have an MRI next week. The wound over her left ischial tuberosity is more superficial and underwent debridement today. Finally she has a small open area in her left popliteal fossa the probes on measurably forward superiorly. Still a lot of drainage coming out of this. The CT scan that I ordered 3 weeks ago was questioned by her insurance company wanting a plain x-ray first. As I understand things result of this is nothing has been done in 3 weeks in terms of imaging the thigh and she has an MRI booked of this along with her pelvis for next week line 10/12/16; patient has a deep probing wound over the right ischiall tuberosity, stage III wound over the left visual tuberosity and a draining sinus in her left popliteal fossa. None of this much different from when I saw this 3 weeks ago. We have been using silver rope to the right ischial  wound and a draining area in the left popliteal fossa. Plain silver alginate to the area on the left ischial tuberosity 10/19/16; the patient's wounds are essentially unchanged although the area on the left lower gluteal is actually improved. Our intake nurse noted drainage from the right initial tuberosity probing wound as well as the draining area in the left popliteal fossa. Both of these were cultured. She had x-rays I think at the insistence of her insurance company on 09/23/16 x-ray of the pelvis was not particularly helpful she did have soft tissue air over the right lower pelvis although with the depth of this wound this is not surprising. An x-ray of her left knee did not show any specific abnormalities. We are still using silver alginate to these wound areas. Her MRI is booked for 10/27 10/26/16; cultures of the purulent drainage in her right initial tuberosity wound grew moderate Proteus and few staph aureus. The same organisms were cultured out of the left knee sinus tract posteriorly. The staph aureus is MRSA. I had started her on Augmentin last week I added doxycycline. The MRI of the left lower Burdi, Chizaram L. ([id number]) extremity and pelvis was finally done. The MRI of the femur showed subcutaneous soft tissue swelling edema fluid and myositis in the vastus lateralis muscle but no soft tissue abscess septic arthritis or osteomyelitis. MRI of the pelvis showed the left wound to be more expensive extending down to the bone there was osteomyelitis. Left hamstring tendons were also involved. No septic arthritis involving the hip. The decubitus ulcer on the right side showed no definite osteomyelitis or abscess.. The right hip wound is  actually the one the probes 6 cm downward. But the MRI showing infection including osteomyelitis on the left explains the draining sinus in the popliteal fossa on the left. She did have antibiotics in the hospitalization last time and this extended into  her nursing home stay but I'm not exactly sure what antibiotics and for what duration. According the patient this did include vancomycin with considerable effort of our staff we are able to get the patient into see Dr. Sampson Goon today. There were transportation difficulties. Her mother had open heart surgery and is in the ICU in [place] therefore her brother was unable to transport. Dr. Jarrett Ables office graciously arranged time to see her today. From my point of view she is going to require IV vancomycin plus perhaps a third generation cephalosporin. I plan to keep her on doxycycline and Augmentin until the IV antibiotics can be arranged. 11/02/2016 - Johnnette presents today for management of ulcers; She saw Dr. 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 She is to have the PICC line placed today and receive the initial dose for both antibiotics today. She has yet to receive the offloading chair cushion and/or mattress overlay from home health, apparently this has been a 3 week process. I encouraged her to speak to home health regarding this matter, along with offering home health to contact the wouns care center with any questions or concerns. The left ischial pressure ulcer continues to imporve, while to right ischial ulcer has increased in depth. The popliteal fossa sinus tract remains unmeasurable due to the limitation of depth measurement (tract extends beyond our measuring devices). 11-16-2016 Ms. Tuohey presents today for evaluation and management of bilateral ischial stage IV pressure ulcers and sinus tract to the left popliteal fossa. she is under the care of Dr. Sampson Goon for IV antibiotic therapy; she states that the vancomycin was placed on hold and will be restarted at a lower dose based on her renal function. She continues taking Zosyn in addition to the vancomycin. She also states that  she has yet to receive offloading cushions from home health, according to her she does not qualify for these offloading cushions because "the ulcers are unstageable ". We will contact the home health agency today to lend clarity regarding her pressure ulcers. The left popliteal fossa sinus tract continues to be a measurable as it extends beyond the length of our measuring devices.. 11/30/16; the patient is still on vancomycin and Zosyn. The depth of the draining sinus behind her left popliteal fossa is down to 4 cm although there is still serosanguineous drainage coming out of this. She saw Dr. Sampson Goon of infectious disease yesterday the idea is to weeks more of IV antibiotics and then oral antibiotics although I have not read his note. The area on the left gluteal fold is just about healed. She has a 6 cm draining sinus over the right initial tuberosity although I cannot feel bone at the base of this. As far as the patient is aware she has not had a recheck of her inflammatory markers. 12/07/16; patient is on vancomycin and Zosyn appointment with Dr. Sampson Goon on the 19th at which point the patient expects to have a change in antibiotics. Remarkable improvement over the wound over the left ischial tuberosity which is just about closed. The draining sinus in her popliteal fossa has 0.4 cm in depth. The area on the right ischial tuberosity still probes down 7 cm.  This is closed and overall wound dimensions but not depth. Electronic Signature(s) Signed: 12/07/2016 4:39:00 PM By: Baltazar Najjar MD Entered By: Baltazar Najjar on 12/07/2016 08:35:51 [name], [name] ([id number]) Suddeth, [name] -------------------------------------------------------------------------------- Physical Exam Details Patient Name: [name] Date of Service: 12/07/2016 8:00 AM Medical Record Patient Account Number: [id number] Number: Treating RN: Phillis Haggis Jun 05, 1961 (55 y.o. Other  Clinician: Date of Birth/Sex: Female) Treating ROBSON, MICHAEL Primary Care Physician/Extender: Leticia Penna Physician: Referring Physician: Tedra Senegal in Treatment: 11 Constitutional Patient is hypertensive.. Pulse regular and within target range for patient.Marland Kitchen Respirations regular, non-labored and within target range.. Temperature is normal and within the target range for the patient.. Patient's appearance is neat and clean. Appears in no acute distress. Well nourished and well developed.. Notes Wound exam; left popliteal is down from 4 cm to less than a centimeter. There is no drainage out of this this week. She is still on IV antibiotics but has an appointment next week to address this. The original wound over the left ischial tuberosity only has a very tiny open area remaining. On the right ischial tuberosity the wound orifice is small however there is still probes down using a curet roughly 7 cm. There is no palpable bone here however. Surrounding soft tissue appears to be stable to improved. No evidence of infection. No soft tissue crepitus. Using an open curette I debrided the area on the right initial tuberosity removing as much slough and nonviable necrotic tissue was could be removed. Of course this is not done under direct visualization Electronic Signature(s) Signed: 12/07/2016 4:39:00 PM By: Baltazar Najjar MD Entered By: Baltazar Najjar on 12/07/2016 08:43:29 [name], [name] ([id number]) -------------------------------------------------------------------------------- Physician Orders Details Patient Name: [name] Date of Service: 12/07/2016 8:00 AM Medical Record Patient Account Number: [id number] Number: Treating RN: Phillis Haggis 14-Aug-1961 (55 y.o. 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 o Cleanse wound with mild soap and water Wound #4 Left Gluteal fold o Clean wound with Normal Saline. o Cleanse wound with mild soap and water Wound #5 Left,Posterior Knee o Clean wound with Normal Saline. o Cleanse wound with mild soap and water Anesthetic Wound #3 Right Gluteal fold o Topical Lidocaine 4% cream applied to wound bed prior to debridement - clinic use only Wound #4 Left Gluteal fold o Topical Lidocaine 4% cream applied to wound bed prior to debridement - clinic use only Wound #5 Left,Posterior Knee o Topical Lidocaine 4% cream applied to wound bed prior to debridement - clinic use only Skin Barriers/Peri-Wound Care Wound #3 Right Gluteal fold Buzzelli, Khila L. ([id number]) o Skin Prep Wound #4 Left Gluteal fold o Skin Prep Wound #5 Left,Posterior Knee o Skin Prep Primary Wound Dressing Wound #3 Right Gluteal fold o Aquacel Ag - or equivalent to Silver Alginate Rope pack lightly into wound and leave a tail where it can be pulled out and to cover the outside of the wound. Wound #4 Left  Gluteal fold o Aquacel Ag - Silver Alginate Wound #5 Left,Posterior Knee o Iodoform packing Gauze - Pack lightly into wound at 12 o'clock and leave a tail where it can be pulled out and to cover the outside of the wound, pack in to wound at as far as you can to keep the tunnel open. Secondary Dressing Wound #3 Right Gluteal fold o Dry Gauze o Non-adherent pad -  Telfa island, tape, or BFD Wound #4 Left Gluteal fold o Dry Gauze o Non-adherent pad - Telfa island, tape, or BFD Wound #5 Left,Posterior Knee o Dry Gauze o Non-adherent pad - Telfa island, tape, or BFD Dressing Change Frequency Wound #3 Right Gluteal fold o Change dressing every other day. Wound #4 Left Gluteal fold o Change dressing every other day. Wound #5 Left,Posterior Knee o Change dressing every other day. Follow-up Appointments Wound #3 Right Gluteal fold o Return Appointment in 1 week. [name], [name] ([id number]) Wound #4 Left Gluteal fold o Return Appointment in 1 week. Wound #5 Left,Posterior Knee o Return Appointment in 1 week. Off-Loading Wound #3 Right Gluteal fold o Roho cushion for wheelchair - *********[facility] to order*********** Wound #4 Left Gluteal fold o Roho cushion for wheelchair - *********[facility] to order*********** o Turn and reposition every 2 hours Wound #5 Left,Posterior Knee o Roho cushion for wheelchair - *********[facility] to order*********** o Turn and reposition every 2 hours Home Health Wound #3 Right Gluteal fold o Continue Home Health Visits - [facility]/ [facility] *****Please order ROHO cushion****** o Home Health Nurse may visit PRN to address patientos wound care needs. o FACE TO FACE ENCOUNTER: MEDICARE and MEDICAID PATIENTS: I certify that this patient is under my care and that I had a face-to-face encounter that meets the physician face-to-face encounter requirements with this patient on this date. The encounter with the patient was in whole or in part for the following MEDICAL CONDITION: (primary reason for Home Healthcare) MEDICAL NECESSITY: I certify, that based on my findings, NURSING services are a medically necessary home health service. HOME BOUND STATUS: I certify that my clinical findings support that this patient is homebound (i.e., Due to illness or injury, pt  requires aid of supportive devices such as crutches, cane, wheelchairs, walkers, the use of special transportation or the assistance of another person to leave their place of residence. There is a normal inability to leave the home and doing so requires considerable and taxing effort. Other absences are for medical reasons / religious services and are infrequent or of short duration when for other reasons). o If current dressing causes regression in wound condition, may D/C ordered dressing product/s and apply Normal Saline Moist Dressing daily until next Wound Healing Center / Other MD appointment. Notify Wound Healing Center of regression in wound condition at [phone]. o Please direct any NON-WOUND related issues/requests for orders to patient's Primary Care Physician Wound #4 Left Gluteal fold o Continue [facility] Health Visits - [facility]/ [facility] *****Please order [facility] cushion***** o Home Health Nurse may visit PRN to address patientos wound care needs. o FACE TO FACE ENCOUNTER: MEDICARE and MEDICAID PATIENTS: I certify that this patient is under my care and that I had a face-to-face encounter that meets the physician face-to-face encounter requirements with this patient on this date. The encounter with the patient was in [place], Ladasha L. ([id number]) whole or in part for the following MEDICAL CONDITION: (primary reason for Home Healthcare) MEDICAL NECESSITY: I certify, that based  on my findings, NURSING services are a medically necessary home health service. HOME BOUND STATUS: I certify that my clinical findings support that this patient is homebound (i.e., Due to illness or injury, pt requires aid of supportive devices such as crutches, cane, wheelchairs, walkers, the use of special transportation or the assistance of another person to leave their place of residence. There is a normal inability to leave the home and doing so requires considerable and taxing effort.  Other absences are for medical reasons / religious services and are infrequent or of short duration when for other reasons). o If current dressing causes regression in wound condition, may D/C ordered dressing product/s and apply Normal Saline Moist Dressing daily until next Wound Healing Center / Other MD appointment. Notify Wound Healing Center of regression in wound condition at [phone]. o Please direct any NON-WOUND related issues/requests for orders to patient's Primary Care Physician Wound #5 Left,Posterior Knee o Continue [facility] Health Visits - [facility] *****Please order [facility] cushion***** o Home Health Nurse may visit PRN to address patientos wound care needs. o FACE TO FACE ENCOUNTER: MEDICARE and MEDICAID PATIENTS: I certify that this patient is under my care and that I had a face-to-face encounter that meets the physician face-to-face encounter requirements with this patient on this date. The encounter with the patient was in whole or in part for the following MEDICAL CONDITION: (primary reason for Home Healthcare) MEDICAL NECESSITY: I certify, that based on my findings, NURSING services are a medically necessary home health service. HOME BOUND STATUS: I certify that my clinical findings support that this patient is homebound (i.e., Due to illness or injury, pt requires aid of supportive devices such as crutches, cane, wheelchairs, walkers, the use of special transportation or the assistance of another person to leave their place of residence. There is a normal inability to leave the home and doing so requires considerable and taxing effort. Other absences are for medical reasons / religious services and are infrequent or of short duration when for other reasons). o If current dressing causes regression in wound condition, may D/C ordered dressing product/s and apply Normal Saline Moist Dressing daily until next Wound Healing Center / Other  MD appointment. Notify Wound Healing Center of regression in wound condition at [phone]. o Please direct any NON-WOUND related issues/requests for orders to patient's Primary Care Physician Medications-please add to medication list. Wound #3 Right Gluteal fold o Other: - Vitamin C, Zinc, Multivitamins IV antibiotics Wound #4 Left Gluteal fold o Other: - Vitamin C, Zinc, Multivitamin o Other: - Vitamin C, Zinc, Multivitamin IV Antibiotics Wound #5 Left,Posterior Knee o Other: - Vitamin C, Zinc, Multivitamin IV Antibiotics Bier, Aeron LMarland Kitchen ([id number]) Electronic Signature(s) Signed: 12/07/2016 1:16:38 PM By: Alejandro Mulling Signed: 12/07/2016 4:39:00 PM By: Baltazar Najjar MD Entered By: Alejandro Mulling on 12/07/2016 08:36:52 [name], [name] ([id number]) -------------------------------------------------------------------------------- Problem List Details Patient Name: [name] Date of Service: 12/07/2016 8:00 AM Medical Record Patient Account Number: [id number] Number: Treating RN: Phillis Haggis 13-Jul-1961 (55 y.o. 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 ([id number]) [name], Katelyn Elbert Ewings ([id number]) -------------------------------------------------------------------------------- Progress Note Details Patient Name: [name] Date of Service: 12/07/2016 8:00 AM Medical Record Patient Account Number: [id number] Number: Treating RN: Phillis Haggis 04-19-1961 (55 y.o. Other Clinician: Date of Birth/Sex: Female) Treating ROBSON, MICHAEL Primary Care Physician/Extender: Leticia Penna Physician: Referring Physician: Tedra Senegal in Treatment: 11 Subjective Chief Complaint Information obtained from Patient Ms. Favata presents today for routine managament of bilateral ischial pressure ulcers and sinus tract to left popliteal fossa. History of Present Illness (HPI) The following HPI elements were documented for the patient's wound: Location: bilateral gluteal ulcerations and left popliteal ulceration with tunneling Quality: adits to intermittent aching to ischial ulcers, no discomfort to sinus tract Severity: right iscial ulcer with increased depth Duration: chronic ulcers to bilateral ischial ulcers and sinus tract Timing: The pain is intermittent in severity as far as how intense it becomes but is present all the time. Manipulationn makes this worse. Context: The wound occurred when the patient had a fall and was unconscious for about 48 hours laying on the floor and she had pressure injury at that stage. Modifying Factors: Other treatment(s) tried include:as noted below she has been seen by visiting wound care physicians or nurse practitioners and details have been noted Associated Signs and Symptoms: has yet to receive offloading cushions and mattress overlay 55 year old patient who was seen by visiting [name] wound care specialist for a wound on both her buttock and was found to have an unstageable wound on the right buttock for about 2 months. I  understand that she had a fall and was laying on the floor for about 48 hours before she was found and taken to the ICU and had a long injury to her gluteal area from pressure and also had broken her right humerus. She has had a right proximal humerus fracture and has been followed up with orthopedics recently. The patient has a past medical history of type 2 diabetes mellitus, paraparesis, acute pyelonephritis, GERD, hypertension, glaucoma, chronic pain, anxiety neurosis, nicotine dependence, COPD. the patient had some debridement done and was to operative was recommended to use Silvadene dressing and offloading. She is a smoker and occasionally smokes a few cigarettes. the patient requested a second opinion for months and is here to discuss her care. 09/21/16; the patient re-presents from home today for review of 3 different wounds. I note that she was seen in the clinic here in July at which time she had bilateral buttock wounds. It was apparently suggested at that time that she use a wound VAC bridged to both wounds just near the initial tuberosity's bilaterally which she refused. Vice, Sabree L. ([id number]) The history was a bit difficult to put together. Apparently this patient became ill at the end of April of this year. She was found sitting on the floor she had apparently been for 2 days and subsequently admitted to hospital from 04/23/16 through 05/02/16 and at that point she was critically ill ultimately having sepsis secondary to UTI, nontraumatic rhabdomyolysis and diabetic ketoacidosis. She had acute renal failure and I think required ICU care including intubation. Patient states her wounds actually started at that point on the bilateral issue tuberosities however in reviewing the discharge summary  from 5/8 I see no reference to wounds at that point. It did state that she had left lower extremity cellulitis however. Reviewing Epic I see no relevant x-rays. It would appear that her  discharge creatinine was within the normal range and indeed on 9/15 her creatinine has remained normal. [facility] There the wounds on her bilateral Botox were dressed. Only just before her discharge from the nursing facility she developed an "knot" which was interpreted as cellulitis on the posterior aspect of her left knee she was given antibiotics. Apparently sometime late in July a this actually opened and became a wound at home health care was tending to however she is still having purulent drainage coming from this and by my understanding the wound depth is actually become unmeasurable. I am not really clear about what home health has been placing in any of these wound areas. The patient states that is something with silver and it. She is not been systemically unwell no fever or chills her appetite is good. She is a diabetic poorly controlled however she states that her recent blood sugars at home have been in the low to mid 100s. 09/28/16 On evaluation today patient appears to continue to exhibit the 3 areas of ulceration that were noted previous. She did have an x-ray of the right pelvis which showed evidence of potential soft tissue infection but no obvious osteomyelitis. There was a discussion last office visit concerning the possibility of a wound VAC. Witth that being said the x-ray report suggested that an MRI may be more appropriate to further evaluate the extent. Subsequently in regard to the wound over the popliteal portion of the left lower extremity with tunneling at 12:00 the CT scan that was ordered was denied by insurance as they state the patient has not had x-rays prior to advanced imaging. Patient states that she is frustrated with the situation overall. 10/05/16 in the interval since I last saw this patient last week she has had the x-ray of the knee performed. I did review that x-ray today and fortunately shows  no evidence of osteomyelitis or other acute abnormality at this point in time. She continues to have the opening iin the posterior oral popliteal space with tracking proximal up the posterior thigh. Nothing seems to have worsened but it also seems to have not improved. The same is true in regard to the right pressure ulcer over the gluteal region which extends toward the ischium. The left gluteal pressure ulcer actually appears to be doing somewhat better my opinion there is some necrotic slough but overall this appears fairly well. She tells me thatt she has some discomfort especially when home health is helping her with dressing changes as they do not know her. At worse she rates her pain to be a 5 out of 10 right now it's more like a 1 out of 10. 10/07/16; still the patient has 3 different wound areas. She has a deep stage IV wound over her right ischial tuberosity. She is due to have an MRI next week. The wound over her left ischial tuberosity is more superficial and underwent debridement today. Finally she has a small open area in her left popliteal fossa the probes on measurably forward superiorly. Still a lot of drainage coming out of this. The CT scan that I ordered 3 weeks ago was questioned by her insurance company wanting a plain x-ray first. As I understand things result of this is  nothing has been done in 3 weeks in terms of imaging the thigh and she has an MRI booked of this along with her pelvis for next week line 10/12/16; patient has a deep probing wound over the right ischiall tuberosity, stage III wound over the left visual tuberosity and a draining sinus in her left popliteal fossa. None of this much different from when I saw this 3 weeks ago. We have been using silver rope to the right ischial wound and a draining area in the left popliteal fossa. Plain silver alginate to the area on the left ischial tuberosity 10/19/16; the patient's wounds are essentially unchanged although  the area on the left lower gluteal is Wittmann, Aaryanna L. ([id number]) actually improved. Our intake nurse noted drainage from the right initial tuberosity probing wound as well as the draining area in the left popliteal fossa. Both of these were cultured. She had x-rays I think at the insistence of her insurance company on 09/23/16 x-ray of the pelvis was not particularly helpful she did have soft tissue air over the right lower pelvis although with the depth of this wound this is not surprising. An x-ray of her left knee did not show any specific abnormalities. We are still using silver alginate to these wound areas. Her MRI is booked for 10/27 10/26/16; cultures of the purulent drainage in her right initial tuberosity wound grew moderate Proteus and few staph aureus. The same organisms were cultured out of the left knee sinus tract posteriorly. The staph aureus is MRSA. I had started her on Augmentin last week I added doxycycline. The MRI of the left lower extremity and pelvis was finally done. The MRI of the femur showed subcutaneous soft tissue swelling edema fluid and myositis in the vastus lateralis muscle but no soft tissue abscess septic arthritis or osteomyelitis. MRI of the pelvis showed the left wound to be more expensive extending down to the bone there was osteomyelitis. Left hamstring tendons were also involved. No septic arthritis involving the hip. The decubitus ulcer on the right side showed no definite osteomyelitis or abscess.. The right hip wound is actually the one the probes 6 cm downward. But the MRI showing infection including osteomyelitis on the left explains the draining sinus in the popliteal fossa on the left. She did have antibiotics in the hospitalization last time and this extended into her nursing home stay but I'm not exactly sure what antibiotics and for what duration. According the patient this did include vancomycin with considerable effort of our staff we are able  to get the patient into see Dr. Sampson GoonFitzgerald today. There were transportation difficulties. Her mother had open heart surgery and [place] therefore her brother was unable to transport. Dr. Jarrett AblesFitzgerald's office graciously arranged time to see her today. From my point of view she is going to require IV vancomycin plus perhaps a third generation cephalosporin. I plan to keep her on doxycycline and Augmentin until the IV antibiotics can be arranged. 11/02/2016 - Samarrah presents today for management of ulcers; She saw Dr. 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 She is to have the PICC line placed today and receive the initial dose for both antibiotics today. She has yet to receive the offloading chair cushion and/or mattress overlay from home health, apparently this has been a 3 week process. I encouraged her to speak to home health regarding this matter, along with offering  home health to contact the wouns care center with any questions or concerns. The left ischial pressure ulcer continues to imporve, while to right ischial ulcer has increased in depth. The popliteal fossa sinus tract remains unmeasurable due to the limitation of depth measurement (tract extends beyond our measuring devices). 11-16-2016 Ms. Noxon presents today for evaluation and management of bilateral ischial stage IV pressure ulcers and sinus tract to the left popliteal fossa. she is under the care of Dr. Sampson Goon for IV antibiotic therapy; she states that the vancomycin was placed on hold and will be restarted at a lower dose based on her renal function. She continues taking Zosyn in addition to the vancomycin. She also states that she has yet to receive offloading cushions from home health, according to her she does not qualify for these offloading cushions because "the ulcers are unstageable ". We will contact the  home health agency today to lend clarity regarding her pressure ulcers. The left popliteal fossa sinus tract continues to be a measurable as it extends beyond the length of our measuring devices.. 11/30/16; the patient is still on vancomycin and Zosyn. The depth of the draining sinus behind her left popliteal fossa is down to 4 cm although there is still serosanguineous drainage coming out of this. She saw Dr. Sampson Goon of infectious disease yesterday the idea is to weeks more of IV antibiotics and then oral antibiotics although I have not read his note. The area on the left gluteal fold is just about healed. She has a 6 cm draining sinus over the right initial tuberosity although I cannot feel bone at the base of this. As far as the patient is aware she has not had a recheck of her inflammatory markers. 12/07/16; patient is on vancomycin and Zosyn appointment with Dr. Sampson Goon on the 19th at which point the patient expects to have a change in antibiotics. Remarkable improvement over the wound over the left [name], [name] ([id number]) ischial tuberosity which is just about closed. The draining sinus in her popliteal fossa has 0.4 cm in depth. The area on the right ischial tuberosity still probes down 7 cm. This is closed and overall wound dimensions but not depth. Objective Constitutional Patient is hypertensive.. Pulse regular and within target range for patient.Marland Kitchen Respirations regular, non-labored and within target range.. Temperature is normal and within the target range for the patient.. Patient's appearance is neat and clean. Appears in no acute distress. Well nourished and well developed.. Vitals Time Taken: 8:08 AM, Height: 63 in, Weight: 257 lbs, BMI: 45.5, Temperature: 98.0 F, Pulse: 80 bpm, Respiratory Rate: 18 breaths/min, Blood Pressure: 148/77 mmHg. General Notes: Wound exam; left popliteal is down from 4 cm to less than a centimeter. There is no drainage out of this this week.  She is still on IV antibiotics but has an appointment next week to address this. The original wound over the left ischial tuberosity only has a very tiny open area remaining. On the right ischial tuberosity the wound orifice is small however there is still probes down using a curet roughly 7 cm. There is no palpable bone here however. Surrounding soft tissue appears to be stable to improved. No evidence of infection. No soft tissue crepitus. Using an open curette I debrided the area on the right initial tuberosity removing as much slough and nonviable necrotic tissue was could be removed. Of course this is not done under direct visualization Integumentary (Hair, Skin) Wound #3 status is Open. Original cause of wound  was Pressure Injury. The wound is located on the Right Gluteal fold. The wound measures 1cm length x 0.6cm width x 7.1cm depth; 0.471cm^2 area and 3.346cm^3 volume. There is muscle, fat, and fascia exposed. There is no tunneling or undermining noted. There is a large amount of purulent drainage noted. The wound margin is distinct with the outline attached to the wound base. There is small (1-33%) pink, pale granulation within the wound bed. There is a large (67-100%) amount of necrotic tissue within the wound bed including Adherent Slough. The periwound skin appearance exhibited: Maceration, Moist. Periwound temperature was noted as No Abnormality. The periwound has tenderness on palpation. Wound #4 status is Open. Original cause of wound was Pressure Injury. The wound is located on the Left Gluteal fold. The wound measures 0.6cm length x 1.5cm width x 0.1cm depth; 0.707cm^2 area and 0.071cm^3 volume. There is muscle and fat exposed. There is no tunneling or undermining noted. There is a large amount of serosanguineous drainage noted. The wound margin is distinct with the outline attached to the wound base. There is small (1-33%) red, pink granulation within the wound bed. There is a  large (67- 100%) amount of necrotic tissue within the wound bed including Eschar and Adherent Slough. The periwound skin appearance exhibited: Maceration, Moist. Periwound temperature was noted as No Abnormality. The periwound has tenderness on palpation. Wound #5 status is Open. Original cause of wound was Gradually Appeared. The wound is located on the [place], Lainie L. ([id number]) Left,Posterior Knee. The wound measures 0.2cm length x 0.2cm width x 0.2cm depth; 0.031cm^2 area and 0.006cm^3 volume. There is muscle and fat exposed. There is no undermining noted, however, there is tunneling at 12:00 with a maximum distance of 0.6cm. There is a large amount of serosanguineous drainage noted. The wound margin is flat and intact. There is large (67-100%) pink granulation within the wound bed. There is a small (1-33%) amount of necrotic tissue within the wound bed including Adherent Slough. The periwound skin appearance exhibited: Localized Edema, Maceration, Moist, Rubor, Erythema. The periwound skin appearance did not exhibit: Callus, Crepitus, Excoriation, Fluctuance, Friable, Induration, Rash, Scarring, Dry/Scaly, Atrophie Blanche, Cyanosis, Ecchymosis, Hemosiderin Staining, Mottled, Pallor. The surrounding wound skin color is noted with erythema which is circumferential. Periwound temperature was noted as Hot. The periwound has tenderness on palpation. 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 There was a Skin/Subcutaneous Tissue Debridement ([id number]) debridement with total area of 0.6 sq cm performed by Maxwell Caul, MD.  with the following instrument(s): scoop to remove Viable and Non-Viable tissue/material including Exudate, Fibrin/Slough, and Subcutaneous after achieving pain control using Lidocaine 4% Topical Solution. A time out was conducted at 08:27, prior to the start of the procedure. A Minimum amount of bleeding was controlled with Pressure. The procedure was tolerated well with a pain level of 0 throughout and a pain level of 0 following the procedure. Post Debridement Measurements: 1cm length x 0.6cm width x 7.1cm depth; 3.346cm^3 volume. Post debridement Stage noted as Category/Stage IV. Character of Wound/Ulcer Post Debridement requires further debridement. Severity of Tissue Post Debridement is: Fat layer exposed. Post procedure Diagnosis Wound #3: Same as Pre-Procedure Schoff,  Anisia L. ([id number]) Plan Wound Cleansing: Wound #3 Right Gluteal fold: Clean wound with Normal Saline. Cleanse wound with mild soap and water Wound #4 Left Gluteal fold: Clean wound with Normal Saline. Cleanse wound with mild soap and water Wound #5 Left,Posterior Knee: Clean wound with Normal Saline. Cleanse wound with mild soap and water Anesthetic: Wound #3 Right Gluteal fold: Topical Lidocaine 4% cream applied to wound bed prior to debridement - clinic use only Wound #4 Left Gluteal fold: Topical Lidocaine 4% cream applied to wound bed prior to debridement - clinic use only Wound #5 Left,Posterior Knee: Topical Lidocaine 4% cream applied to wound bed prior to debridement - clinic use only Skin Barriers/Peri-Wound Care: Wound #3 Right Gluteal fold: Skin Prep Wound #4 Left Gluteal fold: Skin Prep Wound #5 Left,Posterior Knee: Skin Prep Primary Wound Dressing: Wound #3 Right Gluteal fold: Aquacel Ag - or equivalent to Silver Alginate Rope pack lightly into wound and leave a tail where it can be pulled out and to cover the outside of the wound. Wound #4 Left Gluteal fold: Aquacel Ag - Silver Alginate Wound  #5 Left,Posterior Knee: Iodoform packing Gauze - Pack lightly into wound at 12 o'clock and leave a tail where it can be pulled out and to cover the outside of the wound, pack in to wound at as far as you can to keep the tunnel open. Secondary Dressing: Wound #3 Right Gluteal fold: Dry Gauze Non-adherent pad - Telfa island, tape, or BFD Wound #4 Left Gluteal fold: Dry Gauze Non-adherent pad - Telfa island, tape, or BFD Wound #5 Left,Posterior Knee: Dry Gauze Non-adherent pad - Telfa island, tape, or BFD Dressing Change Frequency: Wound #3 Right Gluteal fold: Change dressing every other day. Wound #4 Left Gluteal fold: Change dressing every other day. Seay, Peyten L. ([id number]) Wound #5 Left,Posterior Knee: Change dressing every other day. Follow-up Appointments: Wound #3 Right Gluteal fold: Return Appointment in 1 week. Wound #4 Left Gluteal fold: Return Appointment in 1 week. Wound #5 Left,Posterior Knee: Return Appointment in 1 week. Off-Loading: Wound #3 Right Gluteal fold: Roho cushion for wheelchair - *********[facility] to order*********** Wound #4 Left Gluteal fold: Roho cushion for wheelchair - *********[facility] to order*********** Turn and reposition every 2 hours Wound #5 Left,Posterior Knee: Roho cushion for wheelchair - *********[facility] to order*********** Turn and reposition every 2 hours Home Health: Wound #3 Right Gluteal fold: Continue Home Health Visits - [facility] [facility] *****Please order [facility] cushion****** Home Health Nurse may visit PRN to address patient s wound care needs. FACE TO FACE ENCOUNTER: MEDICARE and MEDICAID PATIENTS: I certify that this patient is under my care and that I had a face-to-face encounter that meets the physician face-to-face encounter requirements with this patient on this date. The encounter with the patient was in whole or in part for the following MEDICAL CONDITION: (primary reason for Home  Healthcare) MEDICAL NECESSITY: I certify, that based on my findings, NURSING services are a medically necessary home health service. HOME BOUND STATUS: I certify that my clinical findings support that this patient is homebound (i.e., Due to illness or injury, pt requires aid of supportive devices such as crutches, cane, wheelchairs, walkers, the use of special transportation or the assistance of another person to leave their place of residence. There is a normal inability to leave the home and doing so requires considerable and taxing effort. Other absences are for medical reasons / religious services and are infrequent or of short duration  when for other reasons). If current dressing causes regression in wound condition, may D/C ordered dressing product/s and apply Normal Saline Moist Dressing daily until next Wound Healing Center / Other MD appointment. Notify Wound Healing Center of regression in wound condition at [phone]. Please direct any NON-WOUND related issues/requests for orders to patient's Primary Care Physician Wound #4 Left Gluteal fold: Continue Home Health Visits - [facility] *****Please order [facility] cushion***** Home Health Nurse may visit PRN to address patient s wound care needs. FACE TO FACE ENCOUNTER: MEDICARE and MEDICAID PATIENTS: I certify that this patient is under my care and that I had a face-to-face encounter that meets the physician face-to-face encounter requirements with this patient on this date. The encounter with the patient was in whole or in part for the following MEDICAL CONDITION: (primary reason for Home Healthcare) MEDICAL NECESSITY: I certify, that based on my findings, NURSING services are a medically necessary home health service. HOME BOUND STATUS: I certify that my clinical findings support that this patient is homebound (i.e., Due to illness or injury, pt requires aid of supportive devices such as crutches, cane, wheelchairs, walkers,  the use of special transportation or the assistance of another person to leave their place of residence. There is a normal inability to leave the home and doing so requires considerable and taxing effort. Other absences are for medical reasons / religious services and are infrequent or of short duration when for other reasons). If current dressing causes regression in wound condition, may D/C ordered dressing product/s and apply Normal Saline Moist Dressing daily until next Wound Healing Center / Other MD appointment. Notify Wound Mccarver, Kaloni L. ([id number]) [facility]aling Center of regression in wound condition at [phone]. Please direct any NON-WOUND related issues/requests for orders to patient's Primary Care Physician Wound #5 Left,Posterior Knee: Continue Home Health Visits - [facility] *****Please order [facility] cushion***** Home Health Nurse may visit PRN to address patient s wound care needs. FACE TO FACE ENCOUNTER: MEDICARE and MEDICAID PATIENTS: I certify that this patient is under my care and that I had a face-to-face encounter that meets the physician face-to-face encounter requirements with this patient on this date. The encounter with the patient was in whole or in part for the following MEDICAL CONDITION: (primary reason for Home Healthcare) MEDICAL NECESSITY: I certify, that based on my findings, NURSING services are a medically necessary home health service. HOME BOUND STATUS: I certify that my clinical findings support that this patient is homebound (i.e., Due to illness or injury, pt requires aid of supportive devices such as crutches, cane, wheelchairs, walkers, the use of special transportation or the assistance of another person to leave their place of residence. There is a normal inability to leave the home and doing so requires considerable and taxing effort. Other absences are for medical reasons / religious services and are infrequent or of short duration when for  other reasons). If current dressing causes regression in wound condition, may D/C ordered dressing product/s and apply Normal Saline Moist Dressing daily until next Wound Healing Center / Other MD appointment. Notify Wound Healing Center of regression in wound condition at [phone]. Please direct any NON-WOUND related issues/requests for orders to patient's Primary Care Physician Medications-please add to medication list.: Wound #3 Right Gluteal fold: Other: - Vitamin C, Zinc, Multivitamins IV antibiotics Wound #4 Left Gluteal fold: Other: - Vitamin C, Zinc, Multivitamin Other: - Vitamin C, Zinc, Multivitamin IV Antibiotics Wound #5 Left,Posterior Knee: Other: - Vitamin  C, Zinc, Multivitamin IV Antibiotics continue with silver alginate rope packing to the right ischial tuberosity. consider wound vac no change in other dressings iodoform packing with iodosorb ointment ot the popliteal fossa Electronic Signature(s) Signed: 12/12/2016 5:01:20 PM By: Elliot Gurney RN, BSN, Kim RN, BSN Signed: 12/13/2016 5:07:10 PM By: Baltazar Najjar MD Previous Signature: 12/07/2016 4:39:00 PM Version By: Baltazar Najjar MD Entered By: Elliot Gurney RN, BSN, Kim on 12/12/2016 17:01:20 Woessner, [name] ([id number]) -------------------------------------------------------------------------------- SuperBill Details Patient Name: [name] Date of Service: 12/07/2016 Medical Record Patient Account Number: [id number] Number: Treating RN: Phillis Haggis [birth year] (55 y.o. 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

## 2016-12-08 NOTE — Progress Notes (Signed)
ALLISON, DESHOTELS ([id number]) Visit Report for 12/07/2016 Arrival Information Details Patient Name: JOSEY, DETTMANN Date of Service: 12/07/2016 8:00 AM Medical Record Patient Account Number: [id number] Number: Treating RN: Ahmed Prima June 12, 1961 (55 y.o. Other Clinician: Date of Birth/Sex: Female) Treating ROBSON, MICHAEL Primary Care Physician: Lavera Guise Physician/Extender: G Referring Physician: Sallee Lange in Treatment: 11 Visit Information History Since Last Visit All ordered tests and consults were completed: No Patient Arrived: Wheel Chair Added or deleted any medications: No Arrival Time: 08:07 Any new allergies or adverse reactions: No Accompanied By: self Had a fall or experienced change in No Transfer Assistance: Easy[employer] activities of daily living that may affect Patient Lift risk of falls: Patient Identification Verified: Yes Signs or symptoms of abuse/neglect since last No Secondary Verification Process Yes visito Completed: Hospitalized since last visit: No Patient Requires Transmission- No Has Dressing in Place as Prescribed: Yes Based Precautions: Pain Present Now: Yes Patient Has Alerts: Yes Patient Alerts: DM II Electronic Signature(s) Signed: 12/07/2016 1:16:38 PM By: Alric Quan Entered By: Alric Quan on 12/07/2016 08:07:52 [place], [name] ([id number]) -------------------------------------------------------------------------------- Encounter Discharge Information Details Patient Name: [name] Date of Service: 12/07/2016 8:00 AM Medical Record Patient Account Number: [id number] Number: Treating RN: Ahmed Prima 08-21-1961 (55 y.o. Other Clinician: Date of Birth/Sex: Female) Treating ROBSON, MICHAEL Primary Care Physician: Lavera Guise Physician/Extender: G Referring Physician: Sallee Lange in Treatment: 11 Encounter Discharge Information Items Discharge Pain Level: 0 Discharge  Condition: Stable Ambulatory Status: Wheelchair Discharge Destination: Home Transportation: Private Auto Accompanied By: self Schedule Follow-up Appointment: Yes Medication Reconciliation completed and provided to Patient/Care Yes Evren Shankland: Provided on Clinical Summary of Care: 12/07/2016 Form Type Recipient Paper Patient TW Electronic Signature(s) Signed: 12/07/2016 8:54:28 AM By: Ruthine Dose Entered By: Ruthine Dose on 12/07/2016 08:54:28 [place], [name] ([id number]) -------------------------------------------------------------------------------- Lower Extremity Assessment Details Patient Name: [name] Date of Service: 12/07/2016 8:00 AM Medical Record Patient Account Number: [id number] Number: Treating RN: Ahmed Prima 05/15/1961 (55 y.o. Other Clinician: Date of Birth/Sex: Female) Treating ROBSON, MICHAEL Primary Care Physician: Lavera Guise Physician/Extender: G Referring Physician: Sallee Lange in Treatment: 11 Electronic Signature(s) Signed: 12/07/2016 1:16:38 PM By: Alric Quan Entered By: Alric Quan on 12/07/2016 08:10:31 [place], [name] ([id number]) -------------------------------------------------------------------------------- Multi Wound Chart Details Patient Name: [name] Date of Service: 12/07/2016 8:00 AM Medical Record Patient Account Number: [id number] Number: Treating RN: Ahmed Prima [birth year] (55 y.o. Other Clinician: Date of Birth/Sex: Female) Treating ROBSON, MICHAEL Primary Care Physician: Lavera Guise Physician/Extender: G Referring Physician: Sallee Lange in Treatment: 11 Vital Signs Height(in): 63 Pulse(bpm): 80 Weight(lbs): 257 Blood Pressure 148/77 (mmHg): Body Mass Index(BMI): 46 Temperature(F): 98.0 Respiratory Rate 18 (breaths/min): Photos: Wound Location: Right Gluteal fold Left Gluteal fold Left Knee - Posterior Wounding Event: Pressure Injury Pressure Injury  Gradually Appeared Primary Etiology: Pressure Ulcer Pressure Ulcer Diabetic Wound/Ulcer of the Lower Extremity Comorbid History: Asthma, Hypertension, Asthma, Hypertension, Asthma, Hypertension, Type II Diabetes, Type II Diabetes, Type II Diabetes, Neuropathy Neuropathy Neuropathy Date Acquired: 04/21/2016 04/21/2016 07/15/2016 Weeks of Treatment: _0 Wound Status: Open Open Open Measurements L x W x D 1x0.6x5.8 0.6x1.5x0.1 0.2x0.2x0.2 (cm) Area (cm) : 0.471 0.707 0.031 Volume (cm) : 2.733 0.071 0.006 % Reduction in Area: 84.70% 90.90% 93.70% % Reduction in Volume: 84.10% 99.50% 99.90% Position 1 (o'clock): 12 Maximum Distance 1 0.6 (cm): Tunneling: No No Yes Classification: Category/Stage IV Category/Stage IV Grade 2 Camire, Kinnley L. ([id number]) Exudate Amount: Large Large  Large Exudate Type: Purulent Serosanguineous Serosanguineous Exudate Color: yellow, brown, green red, brown red, brown Foul Odor After Yes Yes Yes Cleansing: Odor Anticipated Due to No No No Product Use: Wound Margin: Distinct, outline attached Distinct, outline attached Flat and Intact Granulation Amount: Small (1-33%) Small (1-33%) Large (67-100%) Granulation Quality: Pink, Pale Red, Pink Pink Necrotic Amount: Large (67-100%) Large (67-100%) Small (1-33%) Necrotic Tissue: Adherent Slough Eschar, [place] Exposed Structures: Fascia: Yes Fat: Yes Fat: Yes Fat: Yes Muscle: Yes Muscle: Yes Muscle: Yes Fascia: No Fascia: No Tendon: No Tendon: No Tendon: No Joint: No Joint: No Bone: No Bone: No Epithelialization: None Medium (34-66%) None Periwound Skin Texture: No Abnormalities Noted No Abnormalities Noted Edema: Yes Excoriation: No Induration: No Callus: No Crepitus: No Fluctuance: No Friable: No Rash: No Scarring: No Periwound Skin Maceration: Yes Maceration: Yes Maceration: Yes Moisture: Moist: Yes Moist: Yes Moist: Yes Dry/Scaly: No Periwound Skin  Color: No Abnormalities Noted No Abnormalities Noted Erythema: Yes Rubor: Yes Atrophie Blanche: No Cyanosis: No Ecchymosis: No Hemosiderin Staining: No Mottled: No Pallor: No Erythema Location: N/A N/A Circumferential Temperature: No Abnormality No Abnormality Hot Tenderness on Yes Yes Yes Palpation: Wound Preparation: Ulcer Cleansing: Ulcer Cleansing: Ulcer Cleansing: Rinsed/Irrigated with Rinsed/Irrigated with Rinsed/Irrigated with Saline Saline Saline Topical Anesthetic Topical Anesthetic Topical Anesthetic Applied: Other: lidocaine Applied: Other: lidocaine Applied: Other: lidocaine 4% 4% 4% Appelhans, Maniya L. ([id number]) Treatment Notes Electronic Signature(s) Signed: 12/07/2016 1:16:38 PM By: Alric Quan Entered By: Alric Quan on 12/07/2016 08:28:13 [place], [name] ([id number]) -------------------------------------------------------------------------------- [place] Details Patient Name: [name] Date of Service: 12/07/2016 8:00 AM Medical Record Patient Account Number: [id number] Number: Treating RN: Ahmed Prima [birth year] (55 y.o. Other Clinician: Date of Birth/Sex: Female) Treating ROBSON, [place] Primary Care Physician: Lavera Guise Physician/Extender: G Referring Physician: Sallee Lange in Treatment: 11 Active Inactive Abuse / Safety / Falls / Self Care Management Nursing Diagnoses: Potential for falls Goals: Patient will remain injury free Date Initiated: 09/21/2016 Goal Status: Active Interventions: Assess fall risk on admission and as needed Notes: Nutrition Nursing Diagnoses: Imbalanced nutrition Potential for alteratiion in Nutrition/Potential for imbalanced nutrition Goals: Patient/caregiver agrees to and verbalizes understanding of need to use nutritional supplements and/or vitamins as prescribed Date Initiated: 09/21/2016 Goal Status: Active Patient/caregiver verbalizes understanding of  need to maintain therapeutic glucose control per primary care physician Date Initiated: 09/21/2016 Goal Status: Active Interventions: Assess patient nutrition upon admission and as needed per policy Notes: Gainey, [place] ([id number]) Orientation to the Wound Care Program Nursing Diagnoses: Knowledge deficit related to the wound healing center program Goals: Patient/caregiver will verbalize understanding of the [place] Date Initiated: 09/21/2016 Goal Status: Active Interventions: Provide education on orientation to the wound center Notes: Pain, Acute or Chronic Nursing Diagnoses: Pain, acute or chronic: actual or potential Potential alteration in comfort, pain Goals: Patient will verbalize adequate pain control and receive pain control interventions during procedures as needed Date Initiated: 09/21/2016 Goal Status: Active Patient/caregiver will verbalize adequate pain control between visits Date Initiated: 09/21/2016 Goal Status: Active Patient/caregiver will verbalize comfort level met Date Initiated: 09/21/2016 Goal Status: Active Interventions: Assess comfort goal upon admission Complete pain assessment as per visit requirements Notes: Wound/Skin Impairment Nursing Diagnoses: Impaired tissue integrity Goals: Ulcer/skin breakdown will have a volume reduction of 30% by week 4 Date Initiated: 09/21/2016 ICELYNN, ONKEN ([id number]) Goal Status: Active Ulcer/skin breakdown will have a volume reduction of 50% by week 8 Date Initiated: 09/21/2016 Goal Status:  Active Ulcer/skin breakdown will have a volume reduction of 80% by week 12 Date Initiated: 09/21/2016 Goal Status: Active Interventions: Assess ulceration(s) every visit Notes: Electronic Signature(s) Signed: 12/07/2016 1:16:38 PM By: Alric Quan Entered By: Alric Quan on 12/07/2016 08:23:09 Pfarr, Johnnye Carlean Jews  ([id number]) -------------------------------------------------------------------------------- Pain Assessment Details Patient Name: [name] Date of Service: 12/07/2016 8:00 AM Medical Record Patient Account Number: [id number] Number: Treating RN: Ahmed Prima 1961-04-18 (55 y.o. Other Clinician: Date of Birth/Sex: Female) Treating ROBSON, MICHAEL Primary Care Physician: Lavera Guise Physician/Extender: G Referring Physician: Sallee Lange in Treatment: 11 Active Problems Location of Pain Severity and Description of Pain Patient Has Paino Yes Site Locations Pain Location: Pain in Ulcers With Dressing Change: Yes Duration of the Pain. Constant / Intermittento Constant Rate the pain. Current Pain Level: 4 Worst Pain Level: 8 Least Pain Level: 2 Character of Pain Describe the Pain: Aching, Burning Pain Management and Medication Current Pain Management: Electronic Signature(s) Signed: 12/07/2016 1:16:38 PM By: Alric Quan Entered By: Alric Quan on 12/07/2016 08:08:13 [place], [name] ([id number]) -------------------------------------------------------------------------------- Patient/Caregiver Education Details Patient Name: [name] Date of Service: 12/07/2016 8:00 AM Medical Record Patient Account Number: [id number] Number: Treating RN: Ahmed Prima 1961/08/06 (55 y.o. Other Clinician: Date of Birth/Gender: Female) Treating ROBSON, MICHAEL Primary Care Physician: Lavera Guise Physician/Extender: G Referring Physician: Sallee Lange in Treatment: 11 Education Assessment Education Provided To: Patient Education Topics Provided Wound/Skin Impairment: Handouts: Other: change dressing as ordered Methods: Demonstration, Explain/Verbal Responses: State content correctly Electronic Signature(s) Signed: 12/07/2016 1:16:38 PM By: Alric Quan Entered By: Alric Quan on 12/07/2016 08:38:14 Lezcano, Davinity Carlean Jews  ([id number]) -------------------------------------------------------------------------------- Wound Assessment Details Patient Name: [name] Date of Service: 12/07/2016 8:00 AM Medical Record Patient Account Number: [id number] Number: Treating RN: Ahmed Prima 09/13/1961 (55 y.o. Other Clinician: Date of Birth/Sex: Female) Treating ROBSON, [place] Primary Care Physician: Lavera Guise Physician/Extender: G Referring Physician: Sallee Lange in Treatment: 11 Wound Status Wound Number: 3 Primary Pressure Ulcer Etiology: Wound Location: Right Gluteal fold Wound Status: Open Wounding Event: Pressure Injury Comorbid Asthma, Hypertension, Type II Date Acquired: 04/21/2016 History: Diabetes, Neuropathy Weeks Of Treatment: 11 Clustered Wound: No Photos Photo Uploaded By: Alric Quan on 12/07/2016 08:25:26 Wound Measurements Length: (cm) 1 Width: (cm) 0.6 Depth: (cm) 7.1 Area: (cm) 0.471 Volume: (cm) 3.346 % Reduction in Area: 84.7% % Reduction in Volume: 80.5% Epithelialization: None Tunneling: No Undermining: No Wound Description Classification: Category/Stage IV Wound Margin: Distinct, outline attached Exudate Amount: Large Exudate Type: Purulent Exudate Color: yellow, brown, green Foul Odor After Cleansing: Yes Due to Product Use: No Wound Bed Granulation Amount: Small (1-33%) Exposed Structure Granulation Quality: Pink, Pale Fascia Exposed: Yes Necrotic Amount: Large (67-100%) Fat Layer Exposed: Yes Sako, Calirose L. ([id number]) Necrotic Quality: Adherent Slough Tendon Exposed: No Muscle Exposed: Yes Necrosis of Muscle: No Joint Exposed: No Bone Exposed: No Periwound Skin Texture Texture Color No Abnormalities Noted: No No Abnormalities Noted: No Moisture Temperature / Pain No Abnormalities Noted: No Temperature: No Abnormality Maceration: Yes Tenderness on Palpation: Yes Moist: Yes Wound Preparation Ulcer Cleansing:  Rinsed/Irrigated with Saline Topical Anesthetic Applied: Other: lidocaine 4%, Treatment Notes Wound #3 (Right Gluteal fold) 1. Cleansed with: Clean wound with Normal Saline 2. Anesthetic Topical Lidocaine 4% cream to wound bed prior to debridement 3. Peri-wound Care: Skin Prep 4. Dressing Applied: Aquacel Ag 5. Secondary Dressing Applied Dry [place] Signature(s) Signed: 12/07/2016 1:16:38 PM By: Alric Quan Entered By: Alric Quan on  12/07/2016 08:34:58 [place], [name] ([id number]) -------------------------------------------------------------------------------- Wound Assessment Details Patient Name: ARUNA, NESTLER Date of Service: 12/07/2016 8:00 AM Medical Record Patient Account Number: [id number] Number: Treating RN: Ahmed Prima 1961-06-07 (55 y.o. Other Clinician: Date of Birth/Sex: Female) Treating ROBSON, [place] Primary Care Physician: Lavera Guise Physician/Extender: G Referring Physician: Sallee Lange in Treatment: 11 Wound Status Wound Number: 4 Primary Pressure Ulcer Etiology: Wound Location: Left Gluteal fold Wound Status: Open Wounding Event: Pressure Injury Comorbid Asthma, Hypertension, Type II Date Acquired: 04/21/2016 History: Diabetes, Neuropathy Weeks Of Treatment: 11 Clustered Wound: No Photos Photo Uploaded By: Alric Quan on 12/07/2016 08:25:26 Wound Measurements Length: (cm) 0.6 Width: (cm) 1.5 Depth: (cm) 0.1 Area: (cm) 0.707 Volume: (cm) 0.071 % Reduction in Area: 90.9% % Reduction in Volume: 99.5% Epithelialization: Medium (34-66%) Tunneling: No Undermining: No Wound Description Classification: Category/Stage IV Wound Margin: Distinct, outline attached Exudate Amount: Large Exudate Type: Serosanguineous Exudate Color: red, brown Foul Odor After Cleansing: Yes Due to Product Use: No Wound Bed Granulation Amount: Small (1-33%) Exposed Structure Granulation Quality: Red,  Pink Fascia Exposed: No Necrotic Amount: Large (67-100%) Fat Layer Exposed: Yes Michele, Tajanay L. ([id number]) Necrotic Quality: Eschar, Adherent Slough Tendon Exposed: No Muscle Exposed: Yes Necrosis of Muscle: No Periwound Skin Texture Texture Color No Abnormalities Noted: No No Abnormalities Noted: No Moisture Temperature / Pain No Abnormalities Noted: No Temperature: No Abnormality Maceration: Yes Tenderness on Palpation: Yes Moist: Yes Wound Preparation Ulcer Cleansing: Rinsed/Irrigated with Saline Topical Anesthetic Applied: Other: lidocaine 4%, Treatment Notes Wound #4 (Left Gluteal fold) 1. Cleansed with: Clean wound with Normal Saline 2. Anesthetic Topical Lidocaine 4% cream to wound bed prior to debridement 3. Peri-wound Care: Skin Prep 4. Dressing Applied: Aquacel Ag 5. Secondary Dressing Applied Dry [place] Signature(s) Signed: 12/07/2016 1:16:38 PM By: Alric Quan Entered By: Alric Quan on 12/07/2016 08:20:36 [place], [name] ([id number]) -------------------------------------------------------------------------------- Wound Assessment Details Patient Name: [name] Date of Service: 12/07/2016 8:00 AM Medical Record Patient Account Number: [id number] Number: Treating RN: Ahmed Prima May 26, 1961 (55 y.o. Other Clinician: Date of Birth/Sex: Female) Treating ROBSON, [place] Primary Care Physician: Lavera Guise Physician/Extender: G Referring Physician: Sallee Lange in Treatment: 11 Wound Status Wound Number: 5 Primary Diabetic Wound/Ulcer of the Lower Etiology: Extremity Wound Location: Left Knee - Posterior Wound Status: Open Wounding Event: Gradually Appeared Comorbid Asthma, Hypertension, Type II Date Acquired: 07/15/2016 History: Diabetes, Neuropathy Weeks Of Treatment: 11 Clustered Wound: No Photos Photo Uploaded By: Alric Quan on 12/07/2016 08:25:37 Wound Measurements Length: (cm)  0.2 Width: (cm) 0.2 Depth: (cm) 0.2 Area: (cm) 0.031 Volume: (cm) 0.006 % Reduction in Area: 93.7% % Reduction in Volume: 99.9% Epithelialization: None Tunneling: Yes Position (o'clock): 12 Maximum Distance: (cm) 0.6 Undermining: No Wound Description Classification: Grade 2 Wound Margin: Flat and Intact Exudate Amount: Large Exudate Type: Serosanguineous Exudate Color: red, brown Foul Odor After Cleansing: Yes Due to Product Use: No Wound Bed Sokolowski, Hadiyah L. ([id number]) Granulation Amount: Large (67-100%) Exposed Structure Granulation Quality: Pink Fascia Exposed: No Necrotic Amount: Small (1-33%) Fat Layer Exposed: Yes Necrotic Quality: Adherent Slough Tendon Exposed: No Muscle Exposed: Yes Necrosis of Muscle: No Joint Exposed: No Bone Exposed: No Periwound Skin Texture Texture Color No Abnormalities Noted: No No Abnormalities Noted: No Callus: No Atrophie Blanche: No Crepitus: No Cyanosis: No Excoriation: No Ecchymosis: No Fluctuance: No Erythema: Yes Friable: No Erythema Location: Circumferential Induration: No Hemosiderin Staining: No Localized Edema: Yes Mottled: No Rash: No Pallor: No Scarring: No  Rubor: Yes Moisture Temperature / Pain No Abnormalities Noted: No Temperature: Hot Dry / Scaly: No Tenderness on Palpation: Yes Maceration: Yes Moist: Yes Wound Preparation Ulcer Cleansing: Rinsed/Irrigated with Saline Topical Anesthetic Applied: Other: lidocaine 4%, Treatment Notes Wound #5 (Left, Posterior Knee) 1. Cleansed with: Clean wound with Normal Saline 2. Anesthetic Topical Lidocaine 4% cream to wound bed prior to debridement 3. Peri-wound Care: Skin Prep 4. Dressing Applied: Iodoform packing Gauze 5. Secondary [place] Signature(s) Signed: 12/07/2016 1:16:38 PM By: Vivi Ferns, [name] ([id number]) Entered By: Alric Quan on 12/07/2016 08:21:03 [place], [name]  ([id number]) -------------------------------------------------------------------------------- [place] Details Patient Name: [name] Date of Service: 12/07/2016 8:00 AM Medical Record Patient Account Number: [id number] Number: Treating RN: Ahmed Prima 1961/10/11 (55 y.o. Other Clinician: Date of Birth/Sex: Female) Treating ROBSON, MICHAEL Primary Care Physician: Lavera Guise Physician/Extender: G Referring Physician: Sallee Lange in Treatment: 11 Vital Signs Time Taken: 08:08 Temperature (F): 98.0 Height (in): 63 Pulse (bpm): 80 Weight (lbs): 257 Respiratory Rate (breaths/min): 18 Body Mass Index (BMI): 45.5 Blood Pressure (mmHg): 148/77 Reference Range: 80 - 120 mg / dl Electronic Signature(s) Signed: 12/07/2016 1:16:38 PM By: Alric Quan Entered By: Alric Quan on 12/07/2016 08:10:17

## 2016-12-14 ENCOUNTER — Encounter: Payer: Managed Care, Other (non HMO) | Admitting: Internal Medicine

## 2016-12-14 DIAGNOSIS — L89314 Pressure ulcer of right buttock, stage 4: Secondary | ICD-10-CM | POA: Diagnosis not present

## 2016-12-15 NOTE — Progress Notes (Signed)
[name] ([id number]) Visit Report for 12/14/2016 Arrival Information Details Patient Name: [name], [name] Date of Service: 12/14/2016 8:00 AM Medical Record Patient Account Number: [id number] Number: Treating RN: Ahmed Prima 08-02-1961 (55 y.o. Other Clinician: Date of Birth/Sex: Female) Treating ROBSON, MICHAEL Primary Care Physician: Lavera Guise Physician/Extender: G Referring Physician: Sallee Lange in Treatment: 12 Visit Information History Since Last Visit All ordered tests and consults were completed: No Patient Arrived: Wheel Chair Added or deleted any medications: No Arrival Time: 08:16 Any new allergies or adverse reactions: No Accompanied By: self Had a fall or experienced change in No activities of daily living that may affect Transfer Assistance: None risk of falls: Patient Identification Verified: Yes Signs or symptoms of abuse/neglect since last No Secondary Verification Process Yes visito Completed: Hospitalized since last visit: No Patient Requires Transmission-Based No Has Dressing in Place as Prescribed: Yes Precautions: Pain Present Now: Yes Patient Has Alerts: Yes Patient Alerts: DM II Electronic Signature(s) Signed: 12/14/2016 5:12:43 PM By: Alric Quan Entered By: Alric Quan on 12/14/2016 08:16:56 [place], [name] ([id number]) -------------------------------------------------------------------------------- Encounter Discharge Information Details Patient Name: [name] Date of Service: 12/14/2016 8:00 AM Medical Record Patient Account Number: [id number] Number: Treating RN: Ahmed Prima Aug 30, 1961 (55 y.o. Other Clinician: Date of Birth/Sex: Female) Treating ROBSON, MICHAEL Primary Care Physician: Lavera Guise Physician/Extender: G Referring Physician: Sallee Lange in Treatment: 12 Encounter Discharge Information Items Discharge Pain Level: 0 Discharge Condition: Stable Ambulatory  Status: Wheelchair Discharge Destination: Home Transportation: Other Accompanied By: self Schedule Follow-up Appointment: Yes Medication Reconciliation completed and provided to Patient/Care Yes Naidelin Gugliotta: Provided on Clinical Summary of Care: 12/14/2016 Form Type Recipient Paper Patient TW Electronic Signature(s) Signed: 12/14/2016 9:13:06 AM By: Ruthine Dose Entered By: Ruthine Dose on 12/14/2016 09:13:06 [place], [name] ([id number]) -------------------------------------------------------------------------------- Lower Extremity Assessment Details Patient Name: [name] Date of Service: 12/14/2016 8:00 AM Medical Record Patient Account Number: [id number] Number: Treating RN: Ahmed Prima 1961/08/04 (55 y.o. Other Clinician: Date of Birth/Sex: Female) Treating ROBSON, MICHAEL Primary Care Physician: Lavera Guise Physician/Extender: G Referring Physician: Sallee Lange in Treatment: 12 Electronic Signature(s) Signed: 12/14/2016 5:12:43 PM By: Alric Quan Entered By: Alric Quan on 12/14/2016 08:19:02 [place], [name] ([id number]) -------------------------------------------------------------------------------- Multi Wound Chart Details Patient Name: [name] Date of Service: 12/14/2016 8:00 AM Medical Record Patient Account Number: [id number] Number: Treating RN: Ahmed Prima [birth year] (55 y.o. Other Clinician: Date of Birth/Sex: Female) Treating ROBSON, MICHAEL Primary Care Physician: Lavera Guise Physician/Extender: G Referring Physician: Sallee Lange in Treatment: 12 Vital Signs Height(in): 63 Pulse(bpm): 87 Weight(lbs): 257 Blood Pressure 141/73 (mmHg): Body Mass Index(BMI): 46 Temperature(F): 98.2 Respiratory Rate 18 (breaths/min): Photos: [3:No Photos] [4:No Photos] [5:No Photos] Wound Location: [3:Right Gluteal fold] [4:Left Gluteal fold] [5:Left Knee - Posterior] Wounding Event: [3:Pressure  Injury] [4:Pressure Injury] [5:Gradually Appeared] Primary Etiology: [3:Pressure Ulcer] [4:Pressure Ulcer] [5:Diabetic Wound/Ulcer of the Lower Extremity] Comorbid History: [3:Asthma, Hypertension, Type II Diabetes, Neuropathy] [4:Asthma, Hypertension, Type II Diabetes, Neuropathy] [5:Asthma, Hypertension, Type II Diabetes, Neuropathy] Date Acquired: [3:04/21/2016] [4:04/21/2016] [5:07/15/2016] Weeks of Treatment: [3:12] [4:12] [5:12] Wound Status: [3:Open] [4:Open] [5:Open] Measurements L x W x D 1x0.6x6.9 [4:0.1x0.1x0.1] [5:0.1x0.1x0.1] (cm) Area (cm) : [3:0.471] [4:0.008] [5:0.008] Volume (cm) : [3:3.252] [4:0.001] [5:0.001] % Reduction in Area: [3:84.70%] [4:99.90%] [5:98.40%] % Reduction in Volume: 81.10% [4:100.00%] [5:100.00%] Classification: [3:Category/Stage IV] [4:Category/Stage IV] [5:Grade 2] Exudate Amount: [3:Large] [4:Small] [5:Small] Exudate Type: [3:Serous] [4:Serosanguineous] [5:Serous] Exudate Color: [3:amber] [4:red, brown] [5:amber] Foul Odor After [  3:Yes] [4:No] [5:No] Cleansing: Odor Anticipated Due to No [4:N/A] [5:N/A] Product Use: Wound Margin: [3:Distinct, outline attached] [4:Distinct, outline attached Flat and Intact] Granulation Amount: [3:Small (1-33%)] [4:Large (67-100%)] [5:Large (67-100%)] Granulation Quality: [3:Pink, Pale] [4:Pink] [5:Pink] Necrotic Amount: Large (67-100%) Small (1-33%) Small (1-33%) Necrotic Tissue: Adherent Slough Eschar, [place] Exposed Structures: Fascia: Yes Fat: Yes Fat: Yes Fat: Yes Muscle: Yes Muscle: Yes Muscle: Yes Fascia: No Fascia: No Tendon: No Tendon: No Tendon: No Joint: No Joint: No Bone: No Bone: No Epithelialization: None Large (67-100%) Large (67-100%) Debridement: Debridement ([id number] N/A N/A [id number]) Pre-procedure 08:44 N/A N/A Verification/Time Out Taken: Pain Control: Lidocaine 4% Topical N/A N/A Solution Tissue Debrided: Fibrin/Slough, Exudates, N/A  N/A Subcutaneous Level: Skin/Subcutaneous N/A N/A Tissue Debridement Area (sq 0.6 N/A N/A cm): Instrument: Other(scoop) N/A N/A Bleeding: Minimum N/A N/A Hemostasis Achieved: Pressure N/A N/A Procedural Pain: 0 N/A N/A Post Procedural Pain: 0 N/A N/A Debridement Treatment Procedure was tolerated N/A N/A Response: well Post Debridement 1x0.6x6.9 N/A N/A Measurements L x W x D (cm) Post Debridement 3.252 N/A N/A Volume: (cm) Post Debridement Category/Stage IV N/A N/A Stage: Periwound Skin Texture: No Abnormalities Noted Callus: Yes Edema: Yes Excoriation: No Induration: No Callus: No Crepitus: No Fluctuance: No Friable: No Rash: No Scarring: No Periwound Skin Maceration: Yes Dry/Scaly: Yes Maceration: Yes Moisture: Moist: Yes Maceration: No Moist: Yes Moist: No Dry/Scaly: No Periwound Skin Color: No Abnormalities Noted No Abnormalities Noted Erythema: Yes Rubor: Yes Starry, Drisana L. ([id number]) Atrophie Blanche: No Cyanosis: No Ecchymosis: No Hemosiderin Staining: No Mottled: No Pallor: No Erythema Location: N/A N/A Circumferential Temperature: No Abnormality No Abnormality Hot Tenderness on Yes Yes Yes Palpation: Wound Preparation: Ulcer Cleansing: Ulcer Cleansing: Ulcer Cleansing: Rinsed/Irrigated with Rinsed/Irrigated with Rinsed/Irrigated with Saline Saline Saline Topical Anesthetic Topical Anesthetic Topical Anesthetic Applied: Other: lidocaine Applied: None Applied: None 4% Procedures Performed: Debridement N/A N/A Treatment Notes Wound #3 (Right Gluteal fold) 1. Cleansed with: Clean wound with Normal Saline Cleanse wound with antibacterial soap and water 2. Anesthetic Topical Lidocaine 4% cream to wound bed prior to debridement 3. Peri-wound Care: Skin Prep 4. Dressing Applied: Aquacel Ag 5. Secondary [place] Wound #4 (Left Gluteal fold) 1. Cleansed with: Clean wound with Normal Saline Cleanse wound  with antibacterial soap and water 2. Anesthetic Topical Lidocaine 4% cream to wound bed prior to debridement 3. Peri-wound Care: Skin Prep 4. Dressing Applied: Aquacel Ag 5. Secondary Dressing Applied Dry [place] ([id number]) Wound #5 (Left, Posterior Knee) 1. Cleansed with: Clean wound with Normal Saline Cleanse wound with antibacterial soap and water 2. Anesthetic Topical Lidocaine 4% cream to wound bed prior to debridement 3. Peri-wound Care: Skin Prep 4. Dressing Applied: Aquacel Ag 5. Secondary Dressing Applied [place] Signature(s) Signed: 12/14/2016 5:40:41 PM By: Linton Ham MD Entered By: Linton Ham on 12/14/2016 08:59:55 [place], [name] ([id number]) -------------------------------------------------------------------------------- [place] Details Patient Name: [name] Date of Service: 12/14/2016 8:00 AM Medical Record Patient Account Number: [id number] Number: Treating RN: Ahmed Prima [birth year] (55 y.o. Other Clinician: Date of Birth/Sex: Female) Treating ROBSON, MICHAEL Primary Care Physician: Lavera Guise Physician/Extender: G Referring Physician: Sallee Lange in Treatment: 12 Active Inactive Abuse / Safety / Falls / Self Care Management Nursing Diagnoses: Potential for falls Goals: Patient will remain injury free Date Initiated: 09/21/2016 Goal Status: Active Interventions: Assess fall risk on admission and as needed Notes: Nutrition Nursing Diagnoses: Imbalanced nutrition  Potential for alteratiion in Nutrition/Potential for imbalanced nutrition Goals: Patient/caregiver agrees to and verbalizes understanding of need to use nutritional supplements and/or vitamins as prescribed Date Initiated: 09/21/2016 Goal Status: Active Patient/caregiver verbalizes understanding of need to maintain therapeutic glucose control per primary  care physician Date Initiated: 09/21/2016 Goal Status: Active Interventions: Assess patient nutrition upon admission and as needed per policy Notes: Billet, HAYLYN HALBERG ([id number]) Orientation to the Wound Care Program Nursing Diagnoses: Knowledge deficit related to the wound healing center program Goals: Patient/caregiver will verbalize understanding of the [facility] Date Initiated: 09/21/2016 Goal Status: Active Interventions: Provide education on orientation to the wound center Notes: Pain, Acute or Chronic Nursing Diagnoses: Pain, acute or chronic: actual or potential Potential alteration in comfort, pain Goals: Patient will verbalize adequate pain control and receive pain control interventions during procedures as needed Date Initiated: 09/21/2016 Goal Status: Active Patient/caregiver will verbalize adequate pain control between visits Date Initiated: 09/21/2016 Goal Status: Active Patient/caregiver will verbalize comfort level met Date Initiated: 09/21/2016 Goal Status: Active Interventions: Assess comfort goal upon admission Complete pain assessment as per visit requirements Notes: Wound/Skin Impairment Nursing Diagnoses: Impaired tissue integrity Goals: Ulcer/skin breakdown will have a volume reduction of 30% by week 4 Date Initiated: 09/21/2016 SHALONDRA, WUNSCHEL ([id number]) Goal Status: Active Ulcer/skin breakdown will have a volume reduction of 50% by week 8 Date Initiated: 09/21/2016 Goal Status: Active Ulcer/skin breakdown will have a volume reduction of 80% by week 12 Date Initiated: 09/21/2016 Goal Status: Active Interventions: Assess ulceration(s) every visit Notes: Electronic Signature(s) Signed: 12/14/2016 5:12:43 PM By: Alric Quan Entered By: Alric Quan on 12/14/2016 08:41:22 Amodio, Kennidee Carlean Jews ([id number]) -------------------------------------------------------------------------------- Pain Assessment Details Patient Name: [name] Date of Service: 12/14/2016 8:00 AM Medical Record Patient Account Number: [id number] Number: Treating RN: Ahmed Prima 01-26-1961 (55 y.o. Other Clinician: Date of Birth/Sex: Female) Treating ROBSON, MICHAEL Primary Care Physician: Lavera Guise Physician/Extender: G Referring Physician: Sallee Lange in Treatment: 12 Active Problems Location of Pain Severity and Description of Pain Patient Has Paino Yes Site Locations Pain Location: Pain in Ulcers With Dressing Change: Yes Rate the pain. Current Pain Level: 6 Character of Pain Describe the Pain: Aching, Burning Pain Management and Medication Current Pain Management: Electronic Signature(s) Signed: 12/14/2016 5:12:43 PM By: Alric Quan Entered By: Alric Quan on 12/14/2016 08:17:08 Franssen, [name] ([id number]) -------------------------------------------------------------------------------- Patient/Caregiver Education Details Patient Name: [name] Date of Service: 12/14/2016 8:00 AM Medical Record Patient Account Number: [id number] Number: Treating RN: Ahmed Prima [birth year] (55 y.o. Other Clinician: Date of Birth/Gender: Female) Treating ROBSON, MICHAEL Primary Care Physician: Lavera Guise Physician/Extender: G Referring Physician: Sallee Lange in Treatment: 12 Education Assessment Education Provided To: Patient Education Topics Provided Wound/Skin Impairment: Handouts: Other: change dressing as ordered Methods: Demonstration, Explain/Verbal Responses: State content correctly Electronic Signature(s) Signed: 12/14/2016 5:12:43 PM By: Alric Quan Entered By: Alric Quan on 12/14/2016 08:41:09 [place], [place] Carlean Jews ([id number]) -------------------------------------------------------------------------------- Wound Assessment Details Patient Name: [name] Date of Service: 12/14/2016 8:00 AM Medical Record Patient Account Number:  [id number] Number: Treating RN: Ahmed Prima 1961-09-17 (55 y.o. Other Clinician: Date of Birth/Sex: Female) Treating ROBSON, MICHAEL Primary Care Physician: Lavera Guise Physician/Extender: G Referring Physician: Sallee Lange in Treatment: 12 Wound Status Wound Number: 3 Primary Pressure Ulcer Etiology: Wound Location: Right Gluteal fold Wound Status: Open Wounding Event: Pressure Injury Comorbid Asthma, Hypertension, Type II Date Acquired: 04/21/2016 History: Diabetes, Neuropathy Weeks Of Treatment: 12 Clustered Wound: No Photos Photo  Uploaded By: Alric Quan on 12/14/2016 12:36:35 Wound Measurements Length: (cm) 1 Width: (cm) 0.6 Depth: (cm) 6.9 Area: (cm) 0.471 Volume: (cm) 3.252 % Reduction in Area: 84.7% % Reduction in Volume: 81.1% Epithelialization: None Tunneling: No Undermining: No Wound Description Classification: Category/Stage IV Wound Margin: Distinct, outline attached Exudate Amount: Large Exudate Type: Serous Exudate Color: amber Foul Odor After Cleansing: Yes Due to Product Use: No Wound Bed Granulation Amount: Small (1-33%) Exposed Structure Granulation Quality: Pink, Pale Fascia Exposed: Yes Necrotic Amount: Large (67-100%) Fat Layer Exposed: Yes Lincks, Pebbles L. ([id number]) Necrotic Quality: Adherent Slough Tendon Exposed: No Muscle Exposed: Yes Necrosis of Muscle: No Joint Exposed: No Bone Exposed: No Periwound Skin Texture Texture Color No Abnormalities Noted: No No Abnormalities Noted: No Moisture Temperature / Pain No Abnormalities Noted: No Temperature: No Abnormality Maceration: Yes Tenderness on Palpation: Yes Moist: Yes Wound Preparation Ulcer Cleansing: Rinsed/Irrigated with Saline Topical Anesthetic Applied: Other: lidocaine 4%, Treatment Notes Wound #3 (Right Gluteal fold) 1. Cleansed with: Clean wound with Normal Saline Cleanse wound with antibacterial soap and water 2.  Anesthetic Topical Lidocaine 4% cream to wound bed prior to debridement 3. Peri-wound Care: Skin Prep 4. Dressing Applied: Aquacel Ag 5. Secondary Dressing Applied Dry [place] Signature(s) Signed: 12/14/2016 5:12:43 PM By: Alric Quan Entered By: Alric Quan on 12/14/2016 08:41:54 [place], [name] ([id number]) -------------------------------------------------------------------------------- Wound Assessment Details Patient Name: [name] Date of Service: 12/14/2016 8:00 AM Medical Record Patient Account Number: [id number] Number: Treating RN: Ahmed Prima 1961-04-06 (55 y.o. Other Clinician: Date of Birth/Sex: Female) Treating ROBSON, [place] Primary Care Physician: Lavera Guise Physician/Extender: G Referring Physician: Sallee Lange in Treatment: 12 Wound Status Wound Number: 4 Primary Pressure Ulcer Etiology: Wound Location: Left Gluteal fold Wound Status: Open Wounding Event: Pressure Injury Comorbid Asthma, Hypertension, Type II Date Acquired: 04/21/2016 History: Diabetes, Neuropathy Weeks Of Treatment: 12 Clustered Wound: No Photos Photo Uploaded By: Alric Quan on 12/14/2016 12:36:35 Wound Measurements Length: (cm) 0.1 Width: (cm) 0.1 Depth: (cm) 0.1 Area: (cm) 0.008 Volume: (cm) 0.001 % Reduction in Area: 99.9% % Reduction in Volume: 100% Epithelialization: Large (67-100%) Tunneling: No Undermining: No Wound Description Classification: Category/Stage IV Wound Margin: Distinct, outline attached Exudate Amount: Small Exudate Type: Serosanguineous Exudate Color: red, brown Foul Odor After Cleansing: No Wound Bed Granulation Amount: Large (67-100%) Exposed Structure Granulation Quality: Pink Fascia Exposed: No Necrotic Amount: Small (1-33%) Fat Layer Exposed: Yes Bray, Elvia L. ([id number]) Necrotic Quality: Eschar, Adherent Slough Tendon Exposed: No Muscle Exposed: Yes Necrosis of  Muscle: No Periwound Skin Texture Texture Color No Abnormalities Noted: No No Abnormalities Noted: No Callus: Yes Temperature / Pain Moisture Temperature: No Abnormality No Abnormalities Noted: No Tenderness on Palpation: Yes Dry / Scaly: Yes Maceration: No Moist: No Wound Preparation Ulcer Cleansing: Rinsed/Irrigated with Saline Topical Anesthetic Applied: None Treatment Notes Wound #4 (Left Gluteal fold) 1. Cleansed with: Clean wound with Normal Saline Cleanse wound with antibacterial soap and water 2. Anesthetic Topical Lidocaine 4% cream to wound bed prior to debridement 3. Peri-wound Care: Skin Prep 4. Dressing Applied: Aquacel Ag 5. Secondary Dressing Applied Dry [place] Signature(s) Signed: 12/14/2016 5:12:43 PM By: Alric Quan Entered By: Alric Quan on 12/14/2016 08:42:29 [place], [name] ([id number]) -------------------------------------------------------------------------------- Wound Assessment Details Patient Name: [name] Date of Service: 12/14/2016 8:00 AM Medical Record Patient Account Number: [id number] Number: Treating RN: Ahmed Prima 10/29/1961 (55 y.o. Other Clinician: Date of Birth/Sex: Female) Treating ROBSON, [place] Primary Care Physician:  BLISS, [name] Physician/Extender: G Referring Physician: Sallee Lange in Treatment: 12 Wound Status Wound Number: 5 Primary Diabetic Wound/Ulcer of the Lower Etiology: Extremity Wound Location: Left Knee - Posterior Wound Status: Open Wounding Event: Gradually Appeared Comorbid Asthma, Hypertension, Type II Date Acquired: 07/15/2016 History: Diabetes, Neuropathy Weeks Of Treatment: 12 Clustered Wound: No Photos Photo Uploaded By: Alric Quan on 12/14/2016 12:36:50 Wound Measurements Length: (cm) 0.1 Width: (cm) 0.1 Depth: (cm) 0.1 Area: (cm) 0.008 Volume: (cm) 0.001 % Reduction in Area: 98.4% % Reduction in Volume:  100% Epithelialization: Large (67-100%) Tunneling: No Undermining: No Wound Description Classification: Grade 2 Wound Margin: Flat and Intact Exudate Amount: Small Exudate Type: Serous Exudate Color: amber Foul Odor After Cleansing: No Wound Bed Granulation Amount: Large (67-100%) Exposed Structure Granulation Quality: Pink Fascia Exposed: No Necrotic Amount: Small (1-33%) Fat Layer Exposed: Yes Dupre, Brittony L. ([id number]) Necrotic Quality: Adherent Slough Tendon Exposed: No Muscle Exposed: Yes Necrosis of Muscle: No Joint Exposed: No Bone Exposed: No Periwound Skin Texture Texture Color No Abnormalities Noted: No No Abnormalities Noted: No Callus: No Atrophie Blanche: No Crepitus: No Cyanosis: No Excoriation: No Ecchymosis: No Fluctuance: No Erythema: Yes Friable: No Erythema Location: Circumferential Induration: No Hemosiderin Staining: No Localized Edema: Yes Mottled: No Rash: No Pallor: No Scarring: No Rubor: Yes Moisture Temperature / Pain No Abnormalities Noted: No Temperature: Hot Dry / Scaly: No Tenderness on Palpation: Yes Maceration: Yes Moist: Yes Wound Preparation Ulcer Cleansing: Rinsed/Irrigated with Saline Topical Anesthetic Applied: None Treatment Notes Wound #5 (Left, Posterior Knee) 1. Cleansed with: Clean wound with Normal Saline Cleanse wound with antibacterial soap and water 2. Anesthetic Topical Lidocaine 4% cream to wound bed prior to debridement 3. Peri-wound Care: Skin Prep 4. Dressing Applied: Aquacel Ag 5. Secondary Dressing Applied Dry [place] Signature(s) Signed: 12/14/2016 5:12:43 PM By: Alric Quan Entered By: Alric Quan on 12/14/2016 08:43:10 [place], [name] ([id number]) Rakers, [name] ([id number]) -------------------------------------------------------------------------------- [place] Details Patient Name: [name] Date of Service: 12/14/2016 8:00 AM Medical Record Patient  Account Number: [id number] Number: Treating RN: Ahmed Prima 11-28-1961 (55 y.o. Other Clinician: Date of Birth/Sex: Female) Treating ROBSON, MICHAEL Primary Care Physician: Lavera Guise Physician/Extender: G Referring Physician: Sallee Lange in Treatment: 12 Vital Signs Time Taken: 08:17 Temperature (F): 98.2 Height (in): 63 Pulse (bpm): 87 Weight (lbs): 257 Respiratory Rate (breaths/min): 18 Body Mass Index (BMI): 45.5 Blood Pressure (mmHg): 141/73 Reference Range: 80 - 120 mg / dl Electronic Signature(s) Signed: 12/14/2016 5:12:43 PM By: Alric Quan Entered By: Alric Quan on 12/14/2016 08:18:56

## 2016-12-15 NOTE — Progress Notes (Addendum)
[name], [name] ([id number]) Visit Report for 12/14/2016 Chief Complaint Document Details Patient Name: [name], [name] Date of Service: 12/14/2016 8:00 AM Medical Record Patient Account Number: [id number] Number: Treating RN: [name] 1961-01-31 (55 y.o. Other Clinician: Date of Birth/Sex: Female) Treating [name] Primary Care Physician/Extender: Leticia Penna Physician: Referring Physician: Tedra Senegal in Treatment: 12 Information Obtained from: Patient Chief Complaint [name] presents today for routine managament of bilateral ischial pressure ulcers and sinus tract to left popliteal fossa. Electronic Signature(s) Signed: 12/14/2016 5:40:41 PM By: Baltazar Najjar MD Entered By: Baltazar Najjar on 12/14/2016 09:00:41 [name], [name] ([id number]) -------------------------------------------------------------------------------- Debridement Details Patient Name: [name] Date of Service: 12/14/2016 8:00 AM Medical Record Patient Account Number: [id number] Number: Treating RN: [name] [birth year] (55 y.o. Other Clinician: Date of Birth/Sex: Female) Treating [name] Primary Care Physician/Extender: [name], [name] Physician: Referring Physician: Tedra Senegal in Treatment: 12 Debridement Performed for Wound #3 Right Gluteal fold Assessment: Performed By: Physician Maxwell Caul, MD Debridement: Debridement Pre-procedure Yes - 08:44 Verification/Time Out Taken: Start Time: 08:45 Pain Control: Lidocaine 4% Topical Solution Level: Skin/Subcutaneous Tissue Total Area Debrided (L x 1 (cm) x 0.6 (cm) = 0.6 (cm) W): Tissue and other Viable, Non-Viable, Exudate, Fibrin/Slough, Subcutaneous material debrided: Instrument: Other : scoop Bleeding: Minimum Hemostasis Achieved: Pressure End Time: 08:48 Procedural Pain: 0 Post Procedural Pain: 0 Response to Treatment: Procedure was tolerated well Post  Debridement Measurements of Total Wound Length: (cm) 1 Stage: Category/Stage IV Width: (cm) 0.6 Depth: (cm) 6.9 Volume: (cm) 3.252 Character of Wound/Ulcer Post Requires Further Debridement: Debridement Severity of Tissue Post Fat layer exposed Debridement: Post Procedure Diagnosis Same as Pre-procedure Electronic Signature(s) BLIMIE, Katelyn ([id number]) Signed: 12/14/2016 5:12:43 PM By: Alejandro Mulling Signed: 12/14/2016 5:40:41 PM By: Baltazar Najjar MD Entered By: Baltazar Najjar on 12/14/2016 09:00:30 [name], [name] ([id number]) -------------------------------------------------------------------------------- HPI Details Patient Name: [name] Date of Service: 12/14/2016 8:00 AM Medical Record Patient Account Number: [id number] Number: Treating RN: [name] 1961-02-15 (55 y.o. Other Clinician: Date of Birth/Sex: Female) Treating [name] Primary Care Physician/Extender: [name], [name] Physician: Referring Physician: Tedra Senegal in Treatment: 12 History of Present Illness Location: bilateral gluteal ulcerations and left popliteal ulceration with tunneling Quality: adits to intermittent aching to ischial ulcers, no discomfort to sinus tract Severity: right iscial ulcer with increased depth Duration: chronic ulcers to bilateral ischial ulcers and sinus tract Timing: The pain is intermittent in severity as far as how intense it becomes but is present all the time. Manipulationn makes this worse. Context: The wound occurred when the patient had a fall and was unconscious for about 48 hours laying on the floor and she had pressure injury at that stage. Modifying Factors: Other treatment(s) tried include:as noted below she has been seen by visiting wound care physicians or nurse practitioners and details have been noted Associated Signs and Symptoms: has yet to receive offloading cushions and mattress overlay HPI Description: 55 year old  patient who was seen by visiting [name] wound care specialist for a wound on both her buttock and was found to have an unstageable wound on the right buttock for about 2 months. I understand that she had a fall and was laying on the floor for about 48 hours before she was found and taken to the ICU and had a long injury to her gluteal area from pressure and also had broken her right humerus. She has had a right proximal humerus  fracture and has been followed up with orthopedics recently. The patient has a past medical history of type 2 diabetes mellitus, paraparesis, acute pyelonephritis, GERD, hypertension, glaucoma, chronic pain, anxiety neurosis, nicotine dependence, COPD. the patient had some debridement done and was to operative was recommended to use Silvadene dressing and offloading. She is a smoker and occasionally smokes a few cigarettes. the patient requested a second opinion for months and is here to discuss her care. 09/21/16; the patient re-presents from home today for review of 3 different wounds. I note that she was seen in the clinic here in July at which time she had bilateral buttock wounds. It was apparently suggested at that time that she use a wound VAC bridged to both wounds just near the initial tuberosity's bilaterally which she refused. The history was a bit difficult to put together. Apparently this patient became ill at the end of April of this year. She was found sitting on the floor she had apparently been for 2 days and subsequently admitted to hospital from 04/23/16 through 05/02/16 and at that point she was critically ill ultimately having sepsis secondary to UTI, nontraumatic rhabdomyolysis and diabetic ketoacidosis. She had acute renal failure and I think required ICU care including intubation. Patient states her wounds actually started at that point on the bilateral issue tuberosities however in reviewing the discharge summary from 5/8 I see no reference to wounds  at that point. It did state that she had left lower extremity cellulitis however. Reviewing Epic I see no relevant x-rays. It would appear that her discharge creatinine was within the normal range and indeed on Njoku, Juaquina L. ([id number]) 9/15 her creatinine has remained normal. [facility] There the wounds on her bilateral Botox were dressed. Only just before her discharge from the nursing facility she developed an "knot" which was interpreted as cellulitis on the posterior aspect of her left knee she was given antibiotics. Apparently sometime late in July a this actually opened and became a wound at home health care was tending to however she is still having purulent drainage coming from this and by my understanding the wound depth is actually become unmeasurable. I am not really clear about what home health has been placing in any of these wound areas. The patient states that is something with silver and it. She is not been systemically unwell no fever or chills her appetite is good. She is a diabetic poorly controlled however she states that her recent blood sugars at home have been in the low to mid 100s. 09/28/16 On evaluation today patient appears to continue to exhibit the 3 areas of ulceration that were noted previous. She did have an x-ray of the right pelvis which showed evidence of potential soft tissue infection but no obvious osteomyelitis. There was a discussion last office visit concerning the possibility of a wound VAC. Witth that being said the x-ray report suggested that an MRI may be more appropriate to further evaluate the extent. Subsequently in regard to the wound over the popliteal portion of the left lower extremity with tunneling at 12:00 the CT scan that was ordered was denied by insurance as they state the patient has not had x-rays prior to advanced imaging. Patient states that she is frustrated with the  situation overall. 10/05/16 in the interval since I last saw this patient last week she has had the x-ray of the knee performed. I did review that x-ray today and fortunately  shows no evidence of osteomyelitis or other acute abnormality at this point in time. She continues to have the opening iin the posterior oral popliteal space with tracking proximal up the posterior thigh. Nothing seems to have worsened but it also seems to have not improved. The same is true in regard to the right pressure ulcer over the gluteal region which extends toward the ischium. The left gluteal pressure ulcer actually appears to be doing somewhat better my opinion there is some necrotic slough but overall this appears fairly well. She tells me thatt she has some discomfort especially when home health is helping her with dressing changes as they do not know her. At worse she rates her pain to be a 5 out of 10 right now it's more like a 1 out of 10. 10/07/16; still the patient has 3 different wound areas. She has a deep stage IV wound over her right ischial tuberosity. She is due to have an MRI next week. The wound over her left ischial tuberosity is more superficial and underwent debridement today. Finally she has a small open area in her left popliteal fossa the probes on measurably forward superiorly. Still a lot of drainage coming out of this. The CT scan that I ordered 3 weeks ago was questioned by her insurance company wanting a plain x-ray first. As I understand things result of this is nothing has been done in 3 weeks in terms of imaging the thigh and she has an MRI booked of this along with her pelvis for next week line 10/12/16; patient has a deep probing wound over the right ischiall tuberosity, stage III wound over the left visual tuberosity and a draining sinus in her left popliteal fossa. None of this much different from when I saw this 3 weeks ago. We have been using silver rope to the right ischial  wound and a draining area in the left popliteal fossa. Plain silver alginate to the area on the left ischial tuberosity 10/19/16; the patient's wounds are essentially unchanged although the area on the left lower gluteal is actually improved. Our intake nurse noted drainage from the right initial tuberosity probing wound as well as the draining area in the left popliteal fossa. Both of these were cultured. She had x-rays I think at the insistence of her insurance company on 09/23/16 x-ray of the pelvis was not particularly helpful she did have soft tissue air over the right lower pelvis although with the depth of this wound this is not surprising. An x-ray of her left knee did not show any specific abnormalities. We are still using silver alginate to these wound areas. Her MRI is booked for 10/27 10/26/16; cultures of the purulent drainage in her right initial tuberosity wound grew moderate Proteus and few staph aureus. The same organisms were cultured out of the left knee sinus tract posteriorly. The staph aureus is MRSA. I had started her on Augmentin last week I added doxycycline. The MRI of the left lower Burdi, Chizaram L. ([id number]) extremity and pelvis was finally done. The MRI of the femur showed subcutaneous soft tissue swelling edema fluid and myositis in the vastus lateralis muscle but no soft tissue abscess septic arthritis or osteomyelitis. MRI of the pelvis showed the left wound to be more expensive extending down to the bone there was osteomyelitis. Left hamstring tendons were also involved. No septic arthritis involving the hip. The decubitus ulcer on the right side showed no definite osteomyelitis or abscess.. The right hip wound is  actually the one the probes 6 cm downward. But the MRI showing infection including osteomyelitis on the left explains the draining sinus in the popliteal fossa on the left. She did have antibiotics in the hospitalization last time and this extended into  her nursing home stay but I'm not exactly sure what antibiotics and for what duration. According the patient this did include vancomycin with considerable effort of our staff we are able to get the patient into see Dr. Sampson Goon today. There were transportation difficulties. Her mother had open heart surgery and is in the ICU in [place] therefore her brother was unable to transport. Dr. Jarrett Ables office graciously arranged time to see her today. From my point of view she is going to require IV vancomycin plus perhaps a third generation cephalosporin. I plan to keep her on doxycycline and Augmentin until the IV antibiotics can be arranged. 11/02/2016 - Johnnette presents today for management of ulcers; She saw Dr. 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 She is to have the PICC line placed today and receive the initial dose for both antibiotics today. She has yet to receive the offloading chair cushion and/or mattress overlay from home health, apparently this has been a 3 week process. I encouraged her to speak to home health regarding this matter, along with offering home health to contact the wouns care center with any questions or concerns. The left ischial pressure ulcer continues to imporve, while to right ischial ulcer has increased in depth. The popliteal fossa sinus tract remains unmeasurable due to the limitation of depth measurement (tract extends beyond our measuring devices). 11-16-2016 Ms. Tuohey presents today for evaluation and management of bilateral ischial stage IV pressure ulcers and sinus tract to the left popliteal fossa. she is under the care of Dr. Sampson Goon for IV antibiotic therapy; she states that the vancomycin was placed on hold and will be restarted at a lower dose based on her renal function. She continues taking Zosyn in addition to the vancomycin. She also states that  she has yet to receive offloading cushions from home health, according to her she does not qualify for these offloading cushions because "the ulcers are unstageable ". We will contact the home health agency today to lend clarity regarding her pressure ulcers. The left popliteal fossa sinus tract continues to be a measurable as it extends beyond the length of our measuring devices.. 11/30/16; the patient is still on vancomycin and Zosyn. The depth of the draining sinus behind her left popliteal fossa is down to 4 cm although there is still serosanguineous drainage coming out of this. She saw Dr. Sampson Goon of infectious disease yesterday the idea is to weeks more of IV antibiotics and then oral antibiotics although I have not read his note. The area on the left gluteal fold is just about healed. She has a 6 cm draining sinus over the right initial tuberosity although I cannot feel bone at the base of this. As far as the patient is aware she has not had a recheck of her inflammatory markers. 12/07/16; patient is on vancomycin and Zosyn appointment with Dr. Sampson Goon on the 19th at which point the patient expects to have a change in antibiotics. Remarkable improvement over the wound over the left ischial tuberosity which is just about closed. The draining sinus in her popliteal fossa has 0.4 cm in depth. The area on the right ischial tuberosity still probes down 7 cm.  This is closed and overall wound dimensions but not depth. 12/14/16; patient is completing her vancomycin and Zosyn and per her she is going to transition to Bactrim and Augmentin for another 3 weeks. The area on her left gluteal fold is closed except for some skin tears. The area behind her left knee is no longer has any depth. The only remaining area that is of clinical concern is on the right gluteal fold probing towards the right ischial tuberosity. Today this measures 6.9 cm in depth. Very gritty surface LYNSEY, ANGE  ([id number]) Electronic Signature(s) Signed: 12/14/2016 5:40:41 PM By: Baltazar Najjar MD Entered By: Baltazar Najjar on 12/14/2016 09:05:10 [place], [name] -------------------------------------------------------------------------------- Physical Exam Details Patient Name: [name] Date of Service: 12/14/2016 8:00 AM Medical Record Patient Account Number: [id number] Number: Treating RN: [name] 16-Jun-1961 (55 y.o. Other Clinician: Date of Birth/Sex: Female) Treating [name] Primary Care Physician/Extender: Leticia Penna Physician: Referring Physician: Tedra Senegal in Treatment: 12 Constitutional Sitting or standing Blood Pressure is within target range for patient.. Pulse regular and within target range for patient.Marland Kitchen Respirations regular, non-labored and within target range.. Temperature is normal and within the target range for the patient.. Patient's appearance is neat and clean. Appears in no acute distress. Well nourished and well developed.. Notes Wound exam; the left gluteal fold is fully epithelialized except for some skin tears probably from tape. The area behind her left popliteal fossa no longer has any depth and cannot be packed. The area on the right gluteal fold is a very deep probing sinus 6.9 cm the curves superiorly towards her ischial tuberosity. Electronic Signature(s) Signed: 12/14/2016 5:40:41 PM By: Baltazar Najjar MD Entered By: Baltazar Najjar on 12/14/2016 09:07:47 [place], [name] ([id number]) -------------------------------------------------------------------------------- Physician Orders Details Patient Name: [name] Date of Service: 12/14/2016 8:00 AM Medical Record Patient Account Number: [id number] Number: Treating RN: [name] 1961/08/06 (55 y.o. Other Clinician: Date of Birth/Sex: Female) Treating [name] Primary Care Physician/Extender: [name],  [name] Physician: Referring Physician: Tedra Senegal in Treatment: 12 Verbal / Phone Orders: Yes Clinician: Ashok Cordia, Debi Read Back and Verified: Yes Diagnosis Coding Wound Cleansing Wound #3 Right Gluteal fold o Clean wound with Normal Saline. o Cleanse wound with mild soap and water Wound #4 Left Gluteal fold o Clean wound with Normal Saline. o Cleanse wound with mild soap and water Wound #5 Left,Posterior Knee o Clean wound with Normal Saline. o Cleanse wound with mild soap and water Anesthetic Wound #3 Right Gluteal fold o Topical Lidocaine 4% cream applied to wound bed prior to debridement - clinic use only Wound #4 Left Gluteal fold o Topical Lidocaine 4% cream applied to wound bed prior to debridement - clinic use only Wound #5 Left,Posterior Knee o Topical Lidocaine 4% cream applied to wound bed prior to debridement - clinic use only Skin Barriers/Peri-Wound Care Wound #3 Right Gluteal fold o Skin Prep Wound #4 Left Gluteal fold o Skin Prep Wound #5 Left,Posterior Knee o Skin Prep Primary Wound Dressing Whittley, Avaiyah L. ([id number]) Wound #3 Right Gluteal fold o Aquacel Ag - or equivalent to Silver Alginate Rope pack lightly into wound and leave a tail where it can be pulled out and to cover the outside of the wound. Wound #4 Left Gluteal fold o Aquacel Ag - Silver Alginate Wound #5 Left,Posterior Knee o Aquacel Ag - Silver Alginate Secondary Dressing Wound #3 Right Gluteal fold o Dry Gauze o Non-adherent pad - Telfa island,  tape, or BFD Wound #4 Left Gluteal fold o Dry Gauze o Non-adherent pad - Telfa island, tape, or BFD Wound #5 Left,Posterior Knee o Dry Gauze o Non-adherent pad - Telfa island, tape, or BFD Dressing Change Frequency Wound #3 Right Gluteal fold o Change dressing every other day. Wound #4 Left Gluteal fold o Change dressing every other day. Wound #5 Left,Posterior Knee o Change  dressing every other day. Follow-up Appointments Wound #3 Right Gluteal fold o Return Appointment in 1 week. Wound #4 Left Gluteal fold o Return Appointment in 1 week. Wound #5 Left,Posterior Knee o Return Appointment in 1 week. Off-Loading Wound #3 Right Gluteal fold o Roho cushion for wheelchair - *********[facility] to order*********** Wound #4 Left Gluteal fold Christy, Shevelle L. ([id number]) o Roho cushion for wheelchair - *********[facility] to order*********** o Turn and reposition every 2 hours Wound #5 Left,Posterior Knee o Roho cushion for wheelchair - *********[facility] to order*********** o Turn and reposition every 2 hours Home Health Wound #3 Right Gluteal fold o Continue Home Health Visits - [facility] *****Please order ROHO cushion****** o Home Health Nurse may visit PRN to address patientos wound care needs. o FACE TO FACE ENCOUNTER: MEDICARE and MEDICAID PATIENTS: I certify that this patient is under my care and that I had a face-to-face encounter that meets the physician face-to-face encounter requirements with this patient on this date. The encounter with the patient was in whole or in part for the following MEDICAL CONDITION: (primary reason for Home Healthcare) MEDICAL NECESSITY: I certify, that based on my findings, NURSING services are a medically necessary home health service. HOME BOUND STATUS: I certify that my clinical findings support that this patient is homebound (i.e., Due to illness or injury, pt requires aid of supportive devices such as crutches, cane, wheelchairs, walkers, the use of special transportation or the assistance of another person to leave their place of residence. There is a normal inability to leave the home and doing so requires considerable and taxing effort. Other absences are for medical reasons / religious services and are infrequent or of short duration when for other reasons). o  If current dressing causes regression in wound condition, may D/C ordered dressing product/s and apply Normal Saline Moist Dressing daily until next Wound Healing Center / Other MD appointment. Notify Wound Healing Center of regression in wound condition at [phone]. o Please direct any NON-WOUND related issues/requests for orders to patient's Primary Care Physician Wound #4 Left Gluteal fold o Continue Home Health Visits - [facility]/ [facility] *****Please order [facility] cushion***** o Home Health Nurse may visit PRN to address patientos wound care needs. o FACE TO FACE ENCOUNTER: MEDICARE and MEDICAID PATIENTS: I certify that this patient is under my care and that I had a face-to-face encounter that meets the physician face-to-face encounter requirements with this patient on this date. The encounter with the patient was in whole or in part for the following MEDICAL CONDITION: (primary reason for Home Healthcare) MEDICAL NECESSITY: I certify, that based on my findings, NURSING services are a medically necessary home health service. HOME BOUND STATUS: I certify that my clinical findings support that this patient is homebound (i.e., Due to illness or injury, pt requires aid of supportive devices such as crutches, cane, wheelchairs, walkers, the use of special transportation or the assistance of another person to leave their place of residence. There is a normal inability to leave the home and doing so requires considerable and  taxing effort. Other absences are for medical reasons / religious services and are infrequent or of short duration when for other reasons). o If current dressing causes regression in wound condition, may D/C ordered dressing product/s and apply Normal Saline Moist Dressing daily until next Wound Healing Center / Other MD appointment. Notify Wound Healing Center of regression in wound condition at [phone]. BRITTINEY, DICOSTANZO ([id number]) o Please direct  any NON-WOUND related issues/requests for orders to patient's Primary Care Physician Wound #5 Left,Posterior Knee o Continue [facility] Health Visits - [facility] *****Please order [facility] cushion***** o Home Health Nurse may visit PRN to address patientos wound care needs. o FACE TO FACE ENCOUNTER: MEDICARE and MEDICAID PATIENTS: I certify that this patient is under my care and that I had a face-to-face encounter that meets the physician face-to-face encounter requirements with this patient on this date. The encounter with the patient was in whole or in part for the following MEDICAL CONDITION: (primary reason for Home Healthcare) MEDICAL NECESSITY: I certify, that based on my findings, NURSING services are a medically necessary home health service. HOME BOUND STATUS: I certify that my clinical findings support that this patient is homebound (i.e., Due to illness or injury, pt requires aid of supportive devices such as crutches, cane, wheelchairs, walkers, the use of special transportation or the assistance of another person to leave their place of residence. There is a normal inability to leave the home and doing so requires considerable and taxing effort. Other absences are for medical reasons / religious services and are infrequent or of short duration when for other reasons). o If current dressing causes regression in wound condition, may D/C ordered dressing product/s and apply Normal Saline Moist Dressing daily until next Wound Healing Center / Other MD appointment. Notify Wound Healing Center of regression in wound condition at [phone]. o Please direct any NON-WOUND related issues/requests for orders to patient's Primary Care Physician Negative Pressure Wound Therapy Wound #3 Right Gluteal fold o Wound VAC settings at 125/130 mmHg continuous pressure. Use BLACK/GREEN foam to wound cavity. Use WHITE foam to fill any tunnel/s and/or undermining. Change VAC  dressing 3 X WEEK. Change canister as indicated when full. Nurse may titrate settings and frequency of dressing changes as clinically indicated. - Please order this after Christmas per pts request. MD wants to start the wound vac. o Home Health Nurse may d/c VAC for s/s of increased infection, significant wound regression, or uncontrolled drainage. Notify Wound Healing Center at [phone]. o Number of foam/gauze pieces used in the dressing = Medications-please add to medication list. Wound #3 Right Gluteal fold o Other: - Vitamin C, Zinc, Multivitamins IV antibiotics Wound #4 Left Gluteal fold o Other: - Vitamin C, Zinc, Multivitamin o Other: - Vitamin C, Zinc, Multivitamin IV Antibiotics Wound #5 Left,Posterior Knee o Other: - Vitamin C, Zinc, Multivitamin IV Antibiotics Crite, Nyliah LMarland Kitchen ([id number]) Electronic Signature(s) Signed: 12/15/2016 5:57:58 PM By: Baltazar Najjar MD Signed: 12/27/2016 2:01:11 PM By: Alejandro Mulling Previous Signature: 12/14/2016 5:12:43 PM Version By: Alejandro Mulling Previous Signature: 12/14/2016 5:40:41 PM Version By: Baltazar Najjar MD Entered By: Alejandro Mulling on 12/15/2016 12:09:14 Witucki, [name] ([id number]) -------------------------------------------------------------------------------- Problem List Details Patient Name: [name] Date of Service: 12/14/2016 8:00 AM Medical Record Patient Account Number: [id number] Number: Treating RN: [name] 07-Aug-1961 (55 y.o. 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 ([id number]) Kollmann, Yoona Elbert Ewings ([id number]) -------------------------------------------------------------------------------- Progress Note Details Patient Name: [name] Date of Service: 12/14/2016 8:00 AM Medical Record Patient Account Number: [id number] Number: Treating RN: [name] September 17, 1961 (55 y.o. Other Clinician: Date of Birth/Sex: Female) Treating [name] Primary Care Physician/Extender: Leticia Penna Physician: Referring Physician: Tedra Senegal in Treatment: 12 Subjective Chief Complaint Information obtained from Patient Ms. Offer presents today for routine managament of bilateral ischial pressure ulcers and sinus tract to left popliteal fossa. History of Present Illness (HPI) The following HPI elements were documented for the patient's wound: Location: bilateral gluteal ulcerations and left popliteal ulceration with tunneling Quality: adits to intermittent aching to ischial ulcers, no discomfort to sinus tract Severity: right iscial ulcer with increased depth Duration: chronic ulcers to bilateral ischial ulcers and sinus tract Timing: The pain is intermittent in severity as far as how intense it becomes but is present all the time. Manipulationn makes this worse. Context: The wound occurred when the patient had a fall and was unconscious for about 48 hours laying on the floor and she had pressure injury at that stage. Modifying Factors: Other treatment(s) tried include:as  noted below she has been seen by visiting wound care physicians or nurse practitioners and details have been noted Associated Signs and Symptoms: has yet to receive offloading cushions and mattress overlay 55 year old patient who was seen by visiting [name] wound care specialist for a wound on both her buttock and was found to have an unstageable wound on the right buttock for about 2 months. I understand that she had a fall and was laying on the floor for about 48 hours before she was found and taken to the ICU and had a long injury to her gluteal area from pressure and also had broken her right humerus. She has had a right proximal humerus fracture and has been followed up with orthopedics recently. The patient has a past medical history of type 2 diabetes mellitus, paraparesis, acute pyelonephritis, GERD, hypertension, glaucoma, chronic pain, anxiety neurosis, nicotine dependence, COPD. the patient had some debridement done and was to operative was recommended to use Silvadene dressing and offloading. She is a smoker and occasionally smokes a few cigarettes. the patient requested a second opinion for months and is here to discuss her care. 09/21/16; the patient re-presents from home today for review of 3 different wounds. I note that she was seen in the clinic here in July at which time she had bilateral buttock wounds. It was apparently suggested at that time that she use a wound VAC bridged to both wounds just near the initial tuberosity's bilaterally which she refused. Mingle, Amaya L. ([id number]) The history was a bit difficult to put together. Apparently this patient became ill at the end of April of this year. She was found sitting on the floor she had apparently been for 2 days and subsequently admitted to hospital from 04/23/16 through  05/02/16 and at that point she was critically ill ultimately having sepsis secondary to UTI, nontraumatic rhabdomyolysis and diabetic ketoacidosis. She had  acute renal failure and I think required ICU care including intubation. Patient states her wounds actually started at that point on the bilateral issue tuberosities however in reviewing the discharge summary from 5/8 I see no reference to wounds at that point. It did state that she had left lower extremity cellulitis however. Reviewing Epic I see no relevant x-rays. It would appear that her discharge creatinine was within the normal range and indeed on 9/15 her creatinine has remained normal. [facility] There the wounds on her bilateral Botox were dressed. Only just before her discharge from the nursing facility she developed an "knot" which was interpreted as cellulitis on the posterior aspect of her left knee she was given antibiotics. Apparently sometime late in July a this actually opened and became a wound at home health care was tending to however she is still having purulent drainage coming from this and by my understanding the wound depth is actually become unmeasurable. I am not really clear about what home health has been placing in any of these wound areas. The patient states that is something with silver and it. She is not been systemically unwell no fever or chills her appetite is good. She is a diabetic poorly controlled however she states that her recent blood sugars at home have been in the low to mid 100s. 09/28/16 On evaluation today patient appears to continue to exhibit the 3 areas of ulceration that were noted previous. She did have an x-ray of the right pelvis which showed evidence of potential soft tissue infection but no obvious osteomyelitis. There was a discussion last office visit concerning the possibility of a wound VAC. Witth that being said the x-ray report suggested that an MRI may be more appropriate to further evaluate the extent. Subsequently in regard to the wound over the popliteal portion of the left  lower extremity with tunneling at 12:00 the CT scan that was ordered was denied by insurance as they state the patient has not had x-rays prior to advanced imaging. Patient states that she is frustrated with the situation overall. 10/05/16 in the interval since I last saw this patient last week she has had the x-ray of the knee performed. I did review that x-ray today and fortunately shows no evidence of osteomyelitis or other acute abnormality at this point in time. She continues to have the opening iin the posterior oral popliteal space with tracking proximal up the posterior thigh. Nothing seems to have worsened but it also seems to have not improved. The same is true in regard to the right pressure ulcer over the gluteal region which extends toward the ischium. The left gluteal pressure ulcer actually appears to be doing somewhat better my opinion there is some necrotic slough but overall this appears fairly well. She tells me thatt she has some discomfort especially when home health is helping her with dressing changes as they do not know her. At worse she rates her pain to be a 5 out of 10 right now it's more like a 1 out of 10. 10/07/16; still the patient has 3 different wound areas. She has a deep stage IV wound over her right ischial tuberosity. She is due to have an MRI next week. The wound over her left ischial tuberosity is more superficial and underwent debridement today. Finally she  has a small open area in her left popliteal fossa the probes on measurably forward superiorly. Still a lot of drainage coming out of this. The CT scan that I ordered 3 weeks ago was questioned by her insurance company wanting a plain x-ray first. As I understand things result of this is nothing has been done in 3 weeks in terms of imaging the thigh and she has an MRI booked of this along with her pelvis for next week line 10/12/16; patient has a deep probing wound over the right ischiall tuberosity,  stage III wound over the left visual tuberosity and a draining sinus in her left popliteal fossa. None of this much different from when I saw this 3 weeks ago. We have been using silver rope to the right ischial wound and a draining area in the left popliteal fossa. Plain silver alginate to the area on the left ischial tuberosity 10/19/16; the patient's wounds are essentially unchanged although the area on the left lower gluteal is Baldini, Zillah L. ([id number]) actually improved. Our intake nurse noted drainage from the right initial tuberosity probing wound as well as the draining area in the left popliteal fossa. Both of these were cultured. She had x-rays I think at the insistence of her insurance company on 09/23/16 x-ray of the pelvis was not particularly helpful she did have soft tissue air over the right lower pelvis although with the depth of this wound this is not surprising. An x-ray of her left knee did not show any specific abnormalities. We are still using silver alginate to these wound areas. Her MRI is booked for 10/27 10/26/16; cultures of the purulent drainage in her right initial tuberosity wound grew moderate Proteus and few staph aureus. The same organisms were cultured out of the left knee sinus tract posteriorly. The staph aureus is MRSA. I had started her on Augmentin last week I added doxycycline. The MRI of the left lower extremity and pelvis was finally done. The MRI of the femur showed subcutaneous soft tissue swelling edema fluid and myositis in the vastus lateralis muscle but no soft tissue abscess septic arthritis or osteomyelitis. MRI of the pelvis showed the left wound to be more expensive extending down to the bone there was osteomyelitis. Left hamstring tendons were also involved. No septic arthritis involving the hip. The decubitus ulcer on the right side showed no definite osteomyelitis or abscess.. The right hip wound is actually the one the probes 6 cm downward.  But the MRI showing infection including osteomyelitis on the left explains the draining sinus in the popliteal fossa on the left. She did have antibiotics in the hospitalization last time and this extended into her nursing home stay but I'm not exactly sure what antibiotics and for what duration. According the patient this did include vancomycin with considerable effort of our staff we are able to get the patient into see Dr. Sampson Goon today. There were transportation difficulties. Her mother had open heart surgery and is in the ICU in [place] therefore her brother was unable to transport. Dr. Jarrett Ables office graciously arranged time to see her today. From my point of view she is going to require IV vancomycin plus perhaps a third generation cephalosporin. I plan to keep her on doxycycline and Augmentin until the IV antibiotics can be arranged. 11/02/2016 - Raeonna presents today for management of ulcers; She saw Dr. 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 She is  to have the PICC line placed today and receive the initial dose for both antibiotics today. She has yet to receive the offloading chair cushion and/or mattress overlay from home health, apparently this has been a 3 week process. I encouraged her to speak to home health regarding this matter, along with offering home health to contact the wouns care center with any questions or concerns. The left ischial pressure ulcer continues to imporve, while to right ischial ulcer has increased in depth. The popliteal fossa sinus tract remains unmeasurable due to the limitation of depth measurement (tract extends beyond our measuring devices). 11-16-2016 Ms. Luper presents today for evaluation and management of bilateral ischial stage IV pressure ulcers and sinus tract to the left popliteal fossa. she is under the care of Dr. Sampson Goon for IV  antibiotic therapy; she states that the vancomycin was placed on hold and will be restarted at a lower dose based on her renal function. She continues taking Zosyn in addition to the vancomycin. She also states that she has yet to receive offloading cushions from home health, according to her she does not qualify for these offloading cushions because "the ulcers are unstageable ". We will contact the home health agency today to lend clarity regarding her pressure ulcers. The left popliteal fossa sinus tract continues to be a measurable as it extends beyond the length of our measuring devices.. 11/30/16; the patient is still on vancomycin and Zosyn. The depth of the draining sinus behind her left popliteal fossa is down to 4 cm although there is still serosanguineous drainage coming out of this. She saw Dr. Sampson Goon of infectious disease yesterday the idea is to weeks more of IV antibiotics and then oral antibiotics although I have not read his note. The area on the left gluteal fold is just about healed. She has a 6 cm draining sinus over the right initial tuberosity although I cannot feel bone at the base of this. As far as the patient is aware she has not had a recheck of her inflammatory markers. 12/07/16; patient is on vancomycin and Zosyn appointment with Dr. Sampson Goon on the 19th at which point the patient expects to have a change in antibiotics. Remarkable improvement over the wound over the left Finan, Saleha L. ([id number]) ischial tuberosity which is just about closed. The draining sinus in her popliteal fossa has 0.4 cm in depth. The area on the right ischial tuberosity still probes down 7 cm. This is closed and overall wound dimensions but not depth. 12/14/16; patient is completing her vancomycin and Zosyn and per her she is going to transition to Bactrim and Augmentin for another 3 weeks. The area on her left gluteal fold is closed except for some skin tears. The area behind her left  knee is no longer has any depth. The only remaining area that is of clinical concern is on the right gluteal fold probing towards the right ischial tuberosity. Today this measures 6.9 cm in depth. Very gritty surface Objective Constitutional Sitting or standing Blood Pressure is within target range for patient.. Pulse regular and within target range for patient.Marland Kitchen Respirations regular, non-labored and within target range.. Temperature is normal and within the target range for the patient.. Patient's appearance is neat and clean. Appears in no acute distress. Well nourished and well developed.. Vitals Time Taken: 8:17 AM, Height: 63 in, Weight: 257 lbs, BMI: 45.5, Temperature: 98.2 F, Pulse: 87 bpm, Respiratory Rate: 18 breaths/min, Blood Pressure: 141/73 mmHg. General Notes: Wound exam; the left  gluteal fold is fully epithelialized except for some skin tears probably from tape. The area behind her left popliteal fossa no longer has any depth and cannot be packed. The area on the right gluteal fold is a very deep probing sinus 6.9 cm the curves superiorly towards her ischial tuberosity. Integumentary (Hair, Skin) Wound #3 status is Open. Original cause of wound was Pressure Injury. The wound is located on the Right Gluteal fold. The wound measures 1cm length x 0.6cm width x 6.9cm depth; 0.471cm^2 area and 3.252cm^3 volume. There is muscle, fat, and fascia exposed. There is no tunneling or undermining noted. There is a large amount of serous drainage noted. The wound margin is distinct with the outline attached to the wound base. There is small (1-33%) pink, pale granulation within the wound bed. There is a large (67- 100%) amount of necrotic tissue within the wound bed including Adherent Slough. The periwound skin appearance exhibited: Maceration, Moist. Periwound temperature was noted as No Abnormality. The periwound has tenderness on palpation. Wound #4 status is Open. Original cause of  wound was Pressure Injury. The wound is located on the Left Gluteal fold. The wound measures 0.1cm length x 0.1cm width x 0.1cm depth; 0.008cm^2 area and 0.001cm^3 volume. There is muscle and fat exposed. There is no tunneling or undermining noted. There is a small amount of serosanguineous drainage noted. The wound margin is distinct with the outline attached to the wound base. There is large (67-100%) pink granulation within the wound bed. There is a small (1-33%) amount of necrotic tissue within the wound bed including Eschar and Adherent Slough. The periwound skin appearance exhibited: Callus, Dry/Scaly. The periwound skin appearance did not exhibit: Maceration, Moist. Periwound temperature was noted as No Abnormality. The periwound has tenderness Gaspard, Ardythe L. ([id number]) on palpation. Wound #5 status is Open. Original cause of wound was Gradually Appeared. The wound is located on the Left,Posterior Knee. The wound measures 0.1cm length x 0.1cm width x 0.1cm depth; 0.008cm^2 area and 0.001cm^3 volume. There is muscle and fat exposed. There is no tunneling or undermining noted. There is a small amount of serous drainage noted. The wound margin is flat and intact. There is large (67-100%) pink granulation within the wound bed. There is a small (1-33%) amount of necrotic tissue within the wound bed including Adherent Slough. The periwound skin appearance exhibited: Localized Edema, Maceration, Moist, Rubor, Erythema. The periwound skin appearance did not exhibit: Callus, Crepitus, Excoriation, Fluctuance, Friable, Induration, Rash, Scarring, Dry/Scaly, Atrophie Blanche, Cyanosis, Ecchymosis, Hemosiderin Staining, Mottled, Pallor. The surrounding wound skin color is noted with erythema which is circumferential. Periwound temperature was noted as Hot. The periwound has tenderness on palpation. 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 There was a Skin/Subcutaneous Tissue Debridement ([id number]) debridement with total area of 0.6 sq cm performed by Maxwell Caul, MD. with the following instrument(s): scoop to remove Viable and Non-Viable tissue/material including Exudate, Fibrin/Slough, and Subcutaneous after achieving pain control using Lidocaine 4% Topical Solution. A time out was conducted at 08:44, prior to the start of the procedure. A Minimum amount of bleeding was controlled with Pressure. The procedure was tolerated well with  a pain level of 0 throughout and a pain level of 0 following the procedure. Post Debridement Measurements: 1cm length x 0.6cm width x 6.9cm depth; 3.252cm^3 volume. Post debridement Stage noted as Category/Stage IV. Character of Wound/Ulcer Post Debridement requires further debridement. Severity of Tissue Post Debridement is: Fat layer exposed. Post procedure Diagnosis Wound #3: Same as Pre-Procedure Kirwan, Allisa L. ([id number]) Plan Wound Cleansing: Wound #3 Right Gluteal fold: Clean wound with Normal Saline. Cleanse wound with mild soap and water Wound #4 Left Gluteal fold: Clean wound with Normal Saline. Cleanse wound with mild soap and water Wound #5 Left,Posterior Knee: Clean wound with Normal Saline. Cleanse wound with mild soap and water Anesthetic: Wound #3 Right Gluteal fold: Topical Lidocaine 4% cream applied to wound bed prior to debridement - clinic use only Wound #4 Left Gluteal fold: Topical Lidocaine 4% cream applied to wound bed prior to debridement - clinic use only Wound #5 Left,Posterior Knee: Topical Lidocaine 4% cream applied to wound bed prior to  debridement - clinic use only Skin Barriers/Peri-Wound Care: Wound #3 Right Gluteal fold: Skin Prep Wound #4 Left Gluteal fold: Skin Prep Wound #5 Left,Posterior Knee: Skin Prep Primary Wound Dressing: Wound #3 Right Gluteal fold: Aquacel Ag - or equivalent to Silver Alginate Rope pack lightly into wound and leave a tail where it can be pulled out and to cover the outside of the wound. Wound #4 Left Gluteal fold: Aquacel Ag - Silver Alginate Wound #5 Left,Posterior Knee: Aquacel Ag - Silver Alginate Secondary Dressing: Wound #3 Right Gluteal fold: Dry Gauze Non-adherent pad - Telfa island, tape, or BFD Wound #4 Left Gluteal fold: Dry Gauze Non-adherent pad - Telfa island, tape, or BFD Wound #5 Left,Posterior Knee: Dry Gauze Non-adherent pad - Telfa island, tape, or BFD Dressing Change Frequency: Wound #3 Right Gluteal fold: Change dressing every other day. Rosch, Anaeli L. ([id number]) Wound #4 Left Gluteal fold: Change dressing every other day. Wound #5 Left,Posterior Knee: Change dressing every other day. Follow-up Appointments: Wound #3 Right Gluteal fold: Return Appointment in 1 week. Wound #4 Left Gluteal fold: Return Appointment in 1 week. Wound #5 Left,Posterior Knee: Return Appointment in 1 week. Off-Loading: Wound #3 Right Gluteal fold: Roho cushion for wheelchair - *********[facility] to order*********** Wound #4 Left Gluteal fold: Roho cushion for wheelchair - *********[facility] to order*********** Turn and reposition every 2 hours Wound #5 Left,Posterior Knee: Roho cushion for wheelchair - *********[facility] to order*********** Turn and reposition every 2 hours Home Health: Wound #3 Right Gluteal fold: Continue Home Health Visits - [facility] *****Please order [facility] cushion****** Home Health Nurse may visit PRN to address patient s wound care needs. FACE TO FACE ENCOUNTER: MEDICARE and MEDICAID PATIENTS: I certify that this  patient is under my care and that I had a face-to-face encounter that meets the physician face-to-face encounter requirements with this patient on this date. The encounter with the patient was in whole or in part for the following MEDICAL CONDITION: (primary reason for Home Healthcare) MEDICAL NECESSITY: I certify, that based on my findings, NURSING services are a medically necessary home health service. HOME BOUND STATUS: I certify that my clinical findings support that this patient is homebound (i.e., Due to illness or injury, pt requires aid of supportive devices such as crutches, cane, wheelchairs, walkers, the use of special transportation or the assistance of another person to leave their place of residence. There is a normal inability to leave the home and doing  so requires considerable and taxing effort. Other absences are for medical reasons / religious services and are infrequent or of short duration when for other reasons). If current dressing causes regression in wound condition, may D/C ordered dressing product/s and apply Normal Saline Moist Dressing daily until next Wound Healing Center / Other MD appointment. Notify Wound Healing Center of regression in wound condition at [phone]. Please direct any NON-WOUND related issues/requests for orders to patient's Primary Care Physician Wound #4 Left Gluteal fold: Continue Home Health Visits - [facility] *****Please order [facility] cushion***** Home Health Nurse may visit PRN to address patient s wound care needs. FACE TO FACE ENCOUNTER: MEDICARE and MEDICAID PATIENTS: I certify that this patient is under my care and that I had a face-to-face encounter that meets the physician face-to-face encounter requirements with this patient on this date. The encounter with the patient was in whole or in part for the following MEDICAL CONDITION: (primary reason for Home Healthcare) MEDICAL NECESSITY: I certify, that based on my  findings, NURSING services are a medically necessary home health service. HOME BOUND STATUS: I certify that my clinical findings support that this patient is homebound (i.e., Due to illness or injury, pt requires aid of supportive devices such as crutches, cane, wheelchairs, walkers, the use of special transportation or the assistance of another person to leave their place of residence. There is a normal inability to leave the home and doing so requires considerable and taxing effort. Other absences are for medical reasons / religious services and are infrequent or of short duration when for other reasons). Ardizzone, Lara L. ([id number]) If current dressing causes regression in wound condition, may D/C ordered dressing product/s and apply Normal Saline Moist Dressing daily until next Wound Healing Center / Other MD appointment. Notify Wound Healing Center of regression in wound condition at [phone]. Please direct any NON-WOUND related issues/requests for orders to patient's Primary Care Physician Wound #5 Left,Posterior Knee: Continue Home Health Visits - [facility] *****Please order [facility] cushion***** Home Health Nurse may visit PRN to address patient s wound care needs. FACE TO FACE ENCOUNTER: MEDICARE and MEDICAID PATIENTS: I certify that this patient is under my care and that I had a face-to-face encounter that meets the physician face-to-face encounter requirements with this patient on this date. The encounter with the patient was in whole or in part for the following MEDICAL CONDITION: (primary reason for Home Healthcare) MEDICAL NECESSITY: I certify, that based on my findings, NURSING services are a medically necessary home health service. HOME BOUND STATUS: I certify that my clinical findings support that this patient is homebound (i.e., Due to illness or injury, pt requires aid of supportive devices such as crutches, cane, wheelchairs, walkers, the use of special  transportation or the assistance of another person to leave their place of residence. There is a normal inability to leave the home and doing so requires considerable and taxing effort. Other absences are for medical reasons / religious services and are infrequent or of short duration when for other reasons). If current dressing causes regression in wound condition, may D/C ordered dressing product/s and apply Normal Saline Moist Dressing daily until next Wound Healing Center / Other MD appointment. Notify Wound Healing Center of regression in wound condition at [phone]. Please direct any NON-WOUND related issues/requests for orders to patient's Primary Care Physician Negative Pressure Wound Therapy: Wound #3 Right Gluteal fold: Wound VAC settings at 125/130 mmHg continuous pressure. Use BLACK/GREEN foam to wound  cavity. Use WHITE foam to fill any tunnel/s and/or undermining. Change VAC dressing 3 X WEEK. Change canister as indicated when full. Nurse may titrate settings and frequency of dressing changes as clinically indicated. - Please order this after Christmas per pts request. MD wants to start the wound vac. Home Health Nurse may d/c VAC for s/s of increased infection, significant wound regression, or uncontrolled drainage. Notify Wound Healing Center at [phone]. Number of foam/gauze pieces used in the dressing = Medications-please add to medication list.: Wound #3 Right Gluteal fold: Other: - Vitamin C, Zinc, Multivitamins IV antibiotics Wound #4 Left Gluteal fold: Other: - Vitamin C, Zinc, Multivitamin Other: - Vitamin C, Zinc, Multivitamin IV Antibiotics Wound #5 Left,Posterior Knee: Other: - Vitamin C, Zinc, Multivitamin IV Antibiotics #1 silver alginate to the left gluteal fold on the left popliteal fossa. There is no longer any depth in either area #2 antibiotic adjustments noted as directed by infectious disease Dr. Melody Haver, Kinesha L. ([id number]) #3 the real  problem here will be the deep draining sinus on her right gluteal fold. I suspect underneath this there was probably osteomyelitis at 1.. I debrided this today it has a very gritty surface all along the entire length of the wound. I don't think there is any other good option except to try a wound VAC at this point. The patient wanted to put this off until after the holidays. This is not unreasonable. She also talk to me today about going back to work with a wound VAC on. I don't really see the issue or she is worried about this. The bigger issue with returned to work is her ability to walk apparently diagnosed previously with MS Electronic Signature(s) Signed: 12/16/2016 8:41:02 AM By: Elliot Gurney, RN, BSN, Kim RN, BSN Signed: 12/20/2016 3:32:00 PM By: Baltazar Najjar MD Previous Signature: 12/14/2016 5:40:41 PM Version By: Baltazar Najjar MD Entered By: Elliot Gurney RN, BSN, Kim on 12/16/2016 08:41:02 Stroschein, [name] ([id number]) -------------------------------------------------------------------------------- SuperBill Details Patient Name: [name] Date of Service: 12/14/2016 Medical Record Patient Account Number: [id number] Number: Treating RN: [name] [birth year] (55 y.o. 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

## 2016-12-21 ENCOUNTER — Encounter: Payer: Managed Care, Other (non HMO) | Admitting: Internal Medicine

## 2016-12-21 DIAGNOSIS — L89314 Pressure ulcer of right buttock, stage 4: Secondary | ICD-10-CM | POA: Diagnosis not present

## 2016-12-22 NOTE — Progress Notes (Signed)
[name], [name] ([id number]) Visit Report for 12/21/2016 Arrival Information Details Patient Name: [name] Date of Service: 12/21/2016 8:00 AM Medical Record Patient Account Number: [id number] Number: Treating RN: Cornell Barman 02/03/1961 (55 y.o. Other Clinician: Date of Birth/Sex: Female) Treating ROBSON, MICHAEL Primary Care Physician: Lavera Guise Physician/Extender: G Referring Physician: Sallee Lange in Treatment: 13 Visit Information History Since Last Visit Added or deleted any medications: No Patient Arrived: Wheel Chair Any new allergies or adverse reactions: No Arrival Time: 08:01 Had a fall or experienced change in No activities of daily living that may affect Accompanied By: self risk of falls: Transfer Assistance: None Signs or symptoms of abuse/neglect since last No Patient Identification Verified: Yes visito Secondary Verification Process Yes Hospitalized since last visit: No Completed: Has Dressing in Place as Prescribed: Yes Patient Requires Transmission-Based No Pain Present Now: No Precautions: Patient Has Alerts: Yes Patient Alerts: DM II Electronic Signature(s) Signed: 12/21/2016 6:06:27 PM By: Katelyn Cool, RN, BSN, Kim RN, BSN Entered By: [name] on 12/21/2016 08:02:26 [name] ([id number]) -------------------------------------------------------------------------------- Clinic Level of Care Assessment Details Patient Name: [name] Date of Service: 12/21/2016 8:00 AM Medical Record Patient Account Number: [id number] Number: Treating RN: Cornell Barman 11/13/1961 (55 y.o. Other Clinician: Date of Birth/Sex: Female) Treating ROBSON, [place] Primary Care Physician: Lavera Guise Physician/Extender: G Referring Physician: Sallee Lange in Treatment: 13 Clinic Level of Care Assessment Items TOOL 4 Quantity Score [] - Use when only an EandM is performed on FOLLOW-UP visit 0 ASSESSMENTS - Nursing  Assessment / Reassessment [] - Reassessment of Co-morbidities (includes updates in patient status) 0 X - Reassessment of Adherence to Treatment Plan 1 5 ASSESSMENTS - Wound and Skin Assessment / Reassessment X - Simple Wound Assessment / Reassessment - one wound 1 5 [] - Complex Wound Assessment / Reassessment - multiple wounds 0 [] - Dermatologic / Skin Assessment (not related to wound area) 0 ASSESSMENTS - Focused Assessment [] - Circumferential Edema Measurements - multi extremities 0 [] - Nutritional Assessment / Counseling / Intervention 0 [] - Lower Extremity Assessment (monofilament, tuning fork, pulses) 0 [] - Peripheral Arterial Disease Assessment (using hand held doppler) 0 ASSESSMENTS - Ostomy and/or Continence Assessment and Care [] - Incontinence Assessment and Management 0 [] - Ostomy Care Assessment and Management (repouching, etc.) 0 PROCESS - Coordination of Care X - Simple Patient / Family Education for ongoing care 1 15 [] - Complex (extensive) Patient / Family Education for ongoing care 0 X - Staff obtains Programmer, systems, Records, Test Results / Process Orders 1 10 [] - Staff telephones HHA, Nursing Homes / Clarify orders / etc 0 [name], [name] ([id number]) [] - Routine Transfer to another Facility (non-emergent condition) 0 [] - Routine Hospital Admission (non-emergent condition) 0 [] - New Admissions / Biomedical engineer / Ordering NPWT, Apligraf, etc. 0 [] - Emergency Hospital Admission (emergent condition) 0 X - Simple Discharge Coordination 1 10 [] - Complex (extensive) Discharge Coordination 0 PROCESS - Special Needs [] - Pediatric / Minor Patient Management 0 [] - Isolation Patient Management 0 [] - Hearing / Language / Visual special needs 0 [] - Assessment of Community assistance (transportation, D/C planning, etc.) 0 [] - Additional assistance / Altered mentation 0 [] - Support Surface(s) Assessment (bed, cushion, seat, etc.) 0 INTERVENTIONS - Wound  Cleansing / Measurement X - Simple Wound Cleansing - one wound 1 5 [] - Complex Wound Cleansing - multiple wounds 0 X -  Wound Imaging (photographs - any number of wounds) 1 5 [] - Wound Tracing (instead of photographs) 0 X - Simple Wound Measurement - one wound 1 5 [] - Complex Wound Measurement - multiple wounds 0 INTERVENTIONS - Wound Dressings X - Small Wound Dressing one or multiple wounds 1 10 [] - Medium Wound Dressing one or multiple wounds 0 [] - Large Wound Dressing one or multiple wounds 0 [] - Application of Medications - topical 0 [] - Application of Medications - injection 0 [name], [name] ([id number]) INTERVENTIONS - Miscellaneous [] - External ear exam 0 [] - Specimen Collection (cultures, biopsies, blood, body fluids, etc.) 0 [] - Specimen(s) / Culture(s) sent or taken to Lab for analysis 0 [] - Patient Transfer (multiple staff / [name] / Similar devices) 0 [] - Simple Staple / Suture removal (25 or less) 0 [] - Complex Staple / Suture removal (26 or more) 0 [] - Hypo / Hyperglycemic Management (close monitor of Blood Glucose) 0 [] - Ankle / Brachial Index (ABI) - do not check if billed separately 0 X - Vital Signs 1 5 Has the patient been seen at the hospital within the last three years: Yes Total Score: 75 Level Of Care: New/Established - Level 2 Electronic Signature(s) Signed: 12/21/2016 6:06:27 PM By: Katelyn Cool, RN, BSN, Kim RN, BSN Entered By: [name] on 12/21/2016 08:25:06 [name] ([id number]) -------------------------------------------------------------------------------- Encounter Discharge Information Details Patient Name: [name] Date of Service: 12/21/2016 8:00 AM Medical Record Patient Account Number: [id number] Number: Treating RN: Cornell Barman 13-Jun-1961 (55 y.o. Other Clinician: Date of Birth/Sex: Female) Treating ROBSON, MICHAEL Primary Care Physician: Lavera Guise Physician/Extender: G Referring Physician:  Sallee Lange in Treatment: 13 Encounter Discharge Information Items Discharge Pain Level: 0 Discharge Condition: Stable Ambulatory Status: Wheelchair Discharge Destination: Home Transportation: Private Auto Accompanied By: self Schedule Follow-up Appointment: Yes Medication Reconciliation completed and provided to Patient/Care Yes : Provided on Clinical Summary of Care: 12/21/2016 Form Type Recipient Paper Patient TW Electronic Signature(s) Signed: 12/21/2016 8:37:20 AM By: Ruthine Dose Entered By: Ruthine Dose on 12/21/2016 08:37:20 Carlin, Georgia [name] ([id number]) -------------------------------------------------------------------------------- Multi Wound Chart Details Patient Name: [name] Date of Service: 12/21/2016 8:00 AM Medical Record Patient Account Number: [id number] Number: Treating RN: Cornell Barman 1961/01/20 (55 y.o. Other Clinician: Date of Birth/Sex: Female) Treating ROBSON, MICHAEL Primary Care Physician: Lavera Guise Physician/Extender: G Referring Physician: Sallee Lange in Treatment: 13 Vital Signs Height(in): 63 Pulse(bpm): 95 Weight(lbs): 257 Blood Pressure 138/74 (mmHg): Body Mass Index(BMI): 46 Temperature(F): 98 Respiratory Rate 16 (breaths/min): Photos: [4:No Photos] [5:No Photos] Wound Location: Right Gluteal fold Left Gluteal fold Left, Posterior Knee Wounding Event: Pressure Injury Pressure Injury Gradually Appeared Primary Etiology: Pressure Ulcer Pressure Ulcer Diabetic Wound/Ulcer of the Lower Extremity Comorbid History: Asthma, Hypertension, N/A N/A Type II Diabetes, Neuropathy Date Acquired: 04/21/2016 04/21/2016 07/15/2016 Weeks of Treatment: _0 Wound Status: Open Healed - Epithelialized Healed - Epithelialized Measurements L x W x D 0.9x0.6x6 0x0x0 0x0x0 (cm) Area (cm) : 0.424 0 0 Volume (cm) : 2.545 0 0 % Reduction in Area: 86.20% N/A N/A % Reduction in Volume: 85.20% N/A  N/A Position 1 (o'clock): 10 Maximum Distance 1 6 (cm): Tunneling: Yes N/A N/A Classification: Category/Stage IV Category/Stage IV Grade 2 Exudate Amount: Large N/A N/A Rawlinson, Caren L. ([id number]) Exudate Type: Serous N/A N/A Exudate Color: amber N/A N/A Foul Odor After Yes N/A N/A Cleansing: Odor Anticipated Due to No N/A  N/A Product Use: Wound Margin: Distinct, outline attached N/A N/A Granulation Amount: None Present (0%) N/A N/A Necrotic Amount: None Present (0%) N/A N/A Exposed Structures: Fat: Yes N/A N/A Epithelialization: None N/A N/A Periwound Skin Texture: Scarring: Yes No Abnormalities Noted No Abnormalities Noted Edema: No Excoriation: No Induration: No Callus: No Crepitus: No Fluctuance: No Friable: No Rash: No Periwound Skin Maceration: Yes No Abnormalities Noted No Abnormalities Noted Moisture: Moist: Yes Dry/Scaly: No Periwound Skin Color: Atrophie Blanche: No No Abnormalities Noted No Abnormalities Noted Cyanosis: No Ecchymosis: No Erythema: No Hemosiderin Staining: No Mottled: No Pallor: No Rubor: No Temperature: No Abnormality N/A N/A Tenderness on Yes No No Palpation: Wound Preparation: Ulcer Cleansing: N/A N/A Rinsed/Irrigated with Saline Topical Anesthetic Applied: Other: lidocaine 4% Assessment Notes: unable to visualize wound N/A N/A depth. Treatment Notes Electronic Signature(s) Signed: 12/21/2016 5:22:07 PM By: Linton Ham MD Entered By: Linton Ham on 12/21/2016 08:25:17 Guiffre, Georgia [name] ([id number]) [place], Georgia [name] ([id number]) -------------------------------------------------------------------------------- [place] Details Patient Name: [name] Date of Service: 12/21/2016 8:00 AM Medical Record Patient Account Number: [id number] Number: Treating RN: Cornell Barman 1961/08/01 (55 y.o. Other Clinician: Date of Birth/Sex: Female) Treating ROBSON, MICHAEL Primary Care Physician: Lavera Guise Physician/Extender: G Referring Physician: Sallee Lange in Treatment: 13 Active Inactive Abuse / Safety / Falls / Self Care Management Nursing Diagnoses: Potential for falls Goals: Patient will remain injury free Date Initiated: 09/21/2016 Goal Status: Active Interventions: Assess fall risk on admission and as needed Notes: Nutrition Nursing Diagnoses: Imbalanced nutrition Potential for alteratiion in Nutrition/Potential for imbalanced nutrition Goals: Patient/caregiver agrees to and verbalizes understanding of need to use nutritional supplements and/or vitamins as prescribed Date Initiated: 09/21/2016 Goal Status: Active Patient/caregiver verbalizes understanding of need to maintain therapeutic glucose control per primary care physician Date Initiated: 09/21/2016 Goal Status: Active Interventions: Assess patient nutrition upon admission and as needed per policy Notes: [place]leson, [place] ([id number]) Orientation to the Wound Care Program Nursing Diagnoses: Knowledge deficit related to the wound healing center program Goals: Patient/caregiver will verbalize understanding of the [facility] Date Initiated: 09/21/2016 Goal Status: Active Interventions: Provide education on orientation to the wound center Notes: Pain, Acute or Chronic Nursing Diagnoses: Pain, acute or chronic: actual or potential Potential alteration in comfort, pain Goals: Patient will verbalize adequate pain control and receive pain control interventions during procedures as needed Date Initiated: 09/21/2016 Goal Status: Active Patient/caregiver will verbalize adequate pain control between visits Date Initiated: 09/21/2016 Goal Status: Active Patient/caregiver will verbalize comfort level met Date Initiated: 09/21/2016 Goal Status: Active Interventions: Assess comfort goal upon admission Complete pain assessment as per visit requirements Notes: Wound/Skin Impairment Nursing  Diagnoses: Impaired tissue integrity Goals: Ulcer/skin breakdown will have a volume reduction of 30% by week 4 Date Initiated: 09/21/2016 LOTTIE, SIGMAN ([id number]) Goal Status: Active Ulcer/skin breakdown will have a volume reduction of 50% by week 8 Date Initiated: 09/21/2016 Goal Status: Active Ulcer/skin breakdown will have a volume reduction of 80% by week 12 Date Initiated: 09/21/2016 Goal Status: Active Interventions: Assess ulceration(s) every visit Notes: Electronic Signature(s) Signed: 12/21/2016 6:06:27 PM By: Katelyn Cool, RN, BSN, Kim RN, BSN Entered By: [name] on 12/21/2016 08:23:15 Sulewski, Georgia [name] ([id number]) -------------------------------------------------------------------------------- Pain Assessment Details Patient Name: [name] Date of Service: 12/21/2016 8:00 AM Medical Record Patient Account Number: [id number] Number: Treating RN: Cornell Barman June 14, 1961 (55 y.o. Other Clinician: Date of Birth/Sex: Female) Treating ROBSON, MICHAEL Primary Care Physician: Lavera Guise Physician/Extender: Darnell Level  Referring Physician: Sallee Lange in Treatment: 13 Active Problems Location of Pain Severity and Description of Pain Patient Has Paino No Site Locations With Dressing Change: No Pain Management and Medication Current Pain Management: Electronic Signature(s) Signed: 12/21/2016 6:06:27 PM By: Katelyn Cool, RN, BSN, Kim RN, BSN Entered By: [name] on 12/21/2016 08:02:33 Klasen, Georgia [name] ([id number]) -------------------------------------------------------------------------------- Patient/Caregiver Education Details Patient Name: [name] Date of Service: 12/21/2016 8:00 AM Medical Record Patient Account Number: [id number] Number: Treating RN: Cornell Barman 12-03-1961 (55 y.o. Other Clinician: Date of Birth/Gender: Female) Treating ROBSON, MICHAEL Primary Care Physician: Lavera Guise Physician/Extender: G Referring  Physician: Sallee Lange in Treatment: 13 Education Assessment Education Provided To: Patient Education Topics Provided Welcome To [facility]: Wound/Skin Impairment: Handouts: Caring for Your Ulcer Methods: Demonstration, Explain/Verbal Responses: State content correctly Electronic Signature(s) Signed: 12/21/2016 6:06:27 PM By: Katelyn Cool, RN, BSN, Kim RN, BSN Entered By: [name] on 12/21/2016 08:30:55 Peto, Georgia [name] ([id number]) -------------------------------------------------------------------------------- Wound Assessment Details Patient Name: [name] Date of Service: 12/21/2016 8:00 AM Medical Record Patient Account Number: [id number] Number: Treating RN: Cornell Barman Aug 19, 1961 (55 y.o. Other Clinician: Date of Birth/Sex: Female) Treating ROBSON, MICHAEL Primary Care Physician: Lavera Guise Physician/Extender: G Referring Physician: Sallee Lange in Treatment: 13 Wound Status Wound Number: 3 Primary Pressure Ulcer Etiology: Wound Location: Right Gluteal fold Wound Status: Open Wounding Event: Pressure Injury Comorbid Asthma, Hypertension, Type II Date Acquired: 04/21/2016 History: Diabetes, Neuropathy Weeks Of Treatment: 13 Clustered Wound: No Photos Wound Measurements Length: (cm) 0.9 Width: (cm) 0.6 Depth: (cm) 6.5 Area: (cm) 0.424 Volume: (cm) 2.757 % Reduction in Area: 86.2% % Reduction in Volume: 84% Epithelialization: None Tunneling: Yes Position (o'clock): 10 Maximum Distance: (cm) 6 Wound Description Classification: Category/Stage IV Wound Margin: Distinct, outline attached Exudate Amount: Large Exudate Type: Serous Exudate Color: amber Foul Odor After Cleansing: Yes Due to Product Use: No Wound Bed Granulation Amount: None Present (0%) Exposed Structure Necrotic Amount: None Present (0%) Fat Layer Exposed: Yes Periwound Skin Texture Winton, Skylyn L. ([id number]) Texture Color No Abnormalities  Noted: No No Abnormalities Noted: No Callus: No Atrophie Blanche: No Crepitus: No Cyanosis: No Excoriation: No Ecchymosis: No Fluctuance: No Erythema: No Friable: No Hemosiderin Staining: No Induration: No Mottled: No Localized Edema: No Pallor: No Rash: No Rubor: No Scarring: Yes Temperature / Pain Moisture Temperature: No Abnormality No Abnormalities Noted: No Tenderness on Palpation: Yes Dry / Scaly: No Maceration: Yes Moist: Yes Wound Preparation Ulcer Cleansing: Rinsed/Irrigated with Saline Topical Anesthetic Applied: Other: lidocaine 4%, Assessment Notes unable to visualize wound depth. Treatment Notes Wound #3 (Right Gluteal fold) 1. Cleansed with: Cleanse wound with antibacterial soap and water 3. Peri-wound Care: Skin Prep 4. Dressing Applied: Aquacel Ag 5. Secondary [place] Notes Aquacel Ag rope Electronic Signature(s) Signed: 12/21/2016 6:06:27 PM By: Katelyn Cool, RN, BSN, Kim RN, BSN Entered By: [name] on 12/21/2016 08:41:45 Watling, Georgia [name] ([id number]) -------------------------------------------------------------------------------- Wound Assessment Details Patient Name: [name] Date of Service: 12/21/2016 8:00 AM Medical Record Patient Account Number: [id number] Number: Treating RN: Cornell Barman [birth year] (55 y.o. Other Clinician: Date of Birth/Sex: Female) Treating ROBSON, MICHAEL Primary Care Physician: Lavera Guise Physician/Extender: G Referring Physician: Sallee Lange in Treatment: 13 Wound Status Wound Number: 4 Primary Pressure Ulcer Etiology: Wound Location: Left Gluteal fold Wound Status: Healed - Epithelialized Wounding Event: Pressure Injury Comorbid Asthma, Hypertension, Type II Date Acquired: 04/21/2016 History: Diabetes, Neuropathy Weeks  Of Treatment: 13 Clustered Wound: No Photos Wound Measurements Length: (cm) 0 % Reductio Width: (cm) 0 % Reductio Depth: (cm) 0  Epithelial Area: (cm) 0 Tunneling Volume: (cm) 0 Undermini n in Area: 100% n in Volume: 100% ization: Large (67-100%) : No ng: No Wound Description Classification: Category/Stage IV Wound Margin: Distinct, outline attached Exudate Amount: Small Exudate Type: Serosanguineous Exudate Color: red, brown Foul Odor After Cleansing: No Wound Bed Granulation Amount: None Present (0%) Exposed Structure Necrotic Amount: None Present (0%) Fascia Exposed: No Fat Layer Exposed: Yes Tendon Exposed: No Muscle Exposed: Yes Necrosis of Muscle: No Dotzler, Malon L. ([id number]) Periwound Skin Texture Texture Color No Abnormalities Noted: No No Abnormalities Noted: No Callus: No Atrophie Blanche: No Crepitus: No Cyanosis: No Excoriation: No Ecchymosis: No Fluctuance: No Erythema: No Friable: No Hemosiderin Staining: No Induration: No Mottled: No Localized Edema: No Pallor: No Rash: No Rubor: No Scarring: Yes Temperature / Pain Moisture Temperature: No Abnormality No Abnormalities Noted: No Dry / Scaly: Yes Maceration: No Moist: No Wound Preparation Ulcer Cleansing: Rinsed/Irrigated with Saline Topical Anesthetic Applied: None Electronic Signature(s) Signed: 12/21/2016 6:06:27 PM By: Katelyn Cool, RN, BSN, Kim RN, BSN Entered By: [name] on 12/21/2016 08:45:14 Laviolette, Georgia [name] ([id number]) -------------------------------------------------------------------------------- Wound Assessment Details Patient Name: [name] Date of Service: 12/21/2016 8:00 AM Medical Record Patient Account Number: [id number] Number: Treating RN: Cornell Barman 1961-03-29 (55 y.o. Other Clinician: Date of Birth/Sex: Female) Treating ROBSON, [place] Primary Care Physician: Lavera Guise Physician/Extender: G Referring Physician: Sallee Lange in Treatment: 13 Wound Status Wound Number: 5 Primary Diabetic Wound/Ulcer of the Lower Etiology: Extremity Wound Location: Left  Knee - Posterior Wound Status: Healed - Epithelialized Wounding Event: Gradually Appeared Comorbid Asthma, Hypertension, Type II Date Acquired: 07/15/2016 History: Diabetes, Neuropathy Weeks Of Treatment: 13 Clustered Wound: No Photos Wound Measurements Length: (cm) 0 % Reduction in Width: (cm) 0 % Reduction in Depth: (cm) 0 Epithelializati Area: (cm) 0 Tunneling: Volume: (cm) 0 Undermining: Area: 100% Volume: 100% on: Large (67-100%) No No Wound Description Classification: Grade 2 Wound Margin: Flat and Intact Exudate Amount: Small Exudate Type: Serous Exudate Color: amber Foul Odor After Cleansing: No Wound Bed Granulation Amount: None Present (0%) Exposed Structure Necrotic Amount: None Present (0%) Fascia Exposed: No Fat Layer Exposed: No Tendon Exposed: No Muscle Exposed: No Joint Exposed: No Lieser, Shantasia L. ([id number]) Bone Exposed: No Periwound Skin Texture Texture Color No Abnormalities Noted: No No Abnormalities Noted: No Callus: No Atrophie Blanche: No Crepitus: No Cyanosis: No Excoriation: No Ecchymosis: No Fluctuance: No Erythema: No Friable: No Hemosiderin Staining: No Induration: No Mottled: No Localized Edema: No Pallor: No Rash: No Rubor: No Scarring: Yes Temperature / Pain Moisture Temperature: Hot No Abnormalities Noted: No Tenderness on Palpation: Yes Dry / Scaly: Yes Maceration: No Moist: No Wound Preparation Ulcer Cleansing: Rinsed/Irrigated with Saline Topical Anesthetic Applied: None Electronic Signature(s) Signed: 12/21/2016 6:06:27 PM By: Katelyn Cool, RN, BSN, Kim RN, BSN Entered By: [name] on 12/21/2016 08:46:17 Chopp, Georgia [name] ([id number]) -------------------------------------------------------------------------------- Vitals Details Patient Name: [name] Date of Service: 12/21/2016 8:00 AM Medical Record Patient Account Number: [id number] Number: Treating RN: Cornell Barman Aug 06, 1961 (55  y.o. Other Clinician: Date of Birth/Sex: Female) Treating ROBSON, MICHAEL Primary Care Physician: Lavera Guise Physician/Extender: G Referring Physician: Sallee Lange in Treatment: 13 Vital Signs Time Taken: 08:12 Temperature (F): 98 Height (in): 63 Pulse (bpm): 95 Weight (lbs): 257 Respiratory Rate (breaths/min): 16 Body Mass Index (BMI): 45.5  Blood Pressure (mmHg): 138/74 Reference Range: 80 - 120 mg / dl Electronic Signature(s) Signed: 12/21/2016 6:06:27 PM By: Katelyn Cool, RN, BSN, Kim RN, BSN Entered By: [name] on 12/21/2016 08:02:58

## 2016-12-22 NOTE — Progress Notes (Addendum)
[name], [name] ([id number]) Visit Report for 12/21/2016 Chief Complaint Document Details Patient Name: [name], [name] Date of Service: 12/21/2016 8:00 AM Medical Record Patient Account Number: [id number] Number: Treating RN: Huel CoventryWoody, Kim 1961-12-18 (55 y.o. Other Clinician: Date of Birth/Sex: Female) Treating [name] Primary Care Physician/Extender: Leticia PennaG BLISS, [name] Physician: Referring Physician: Tedra SenegalBLISS, [name] Weeks in Treatment: 13 Information Obtained from: Patient Chief Complaint [name] presents today for routine managament of bilateral ischial pressure ulcers and sinus tract to left popliteal fossa. Electronic Signature(s) Signed: 12/21/2016 5:22:07 PM By: Baltazar Najjarobson, Kimberley Speece MD Entered By: Baltazar Najjarobson, Lou Irigoyen on 12/21/2016 08:25:41 [name], Katelyn ShellerAMMY L. ([id number]) -------------------------------------------------------------------------------- HPI Details Patient Name: [name], [name] Date of Service: 12/21/2016 8:00 AM Medical Record Patient Account Number: [id number] Number: Treating RN: Huel CoventryWoody, Kim 1961-12-18 (55 y.o. Other Clinician: Date of Birth/Sex: Female) Treating Brylee Berk Primary Care Physician/Extender: Antonietta BreachG BLISS, [name] Physician: Referring Physician: Tedra SenegalBLISS, [name] Weeks in Treatment: 13 History of Present Illness Location: bilateral gluteal ulcerations and left popliteal ulceration with tunneling Quality: adits to intermittent aching to ischial ulcers, no discomfort to sinus tract Severity: right iscial ulcer with increased depth Duration: chronic ulcers to bilateral ischial ulcers and sinus tract Timing: The pain is intermittent in severity as far as how intense it becomes but is present all the time. Manipulationn makes this worse. Context: The wound occurred when the patient had a fall and was unconscious for about 48 hours laying on the floor and she had pressure injury at that stage. Modifying Factors: Other treatment(s) tried  include:as noted below she has been seen by visiting wound care physicians or nurse practitioners and details have been noted Associated Signs and Symptoms: has yet to receive offloading cushions and mattress overlay HPI Description: 55 year old patient who was seen by visiting [name] wound care specialist for a wound on both her buttock and was found to have an unstageable wound on the right buttock for about 2 months. I understand that she had a fall and was laying on the floor for about 48 hours before she was found and taken to the ICU and had a long injury to her gluteal area from pressure and also had broken her right humerus. She has had a right proximal humerus fracture and has been followed up with orthopedics recently. The patient has a past medical history of type 2 diabetes mellitus, paraparesis, acute pyelonephritis, GERD, hypertension, glaucoma, chronic pain, anxiety neurosis, nicotine dependence, COPD. the patient had some debridement done and was to operative was recommended to use Silvadene dressing and offloading. She is a smoker and occasionally smokes a few cigarettes. the patient requested a second opinion for months and is here to discuss her care. 54/27/17; the patient re-presents from home today for review of 3 different wounds. I note that she was seen in the clinic here in July at which time she had bilateral buttock wounds. It was apparently suggested at that time that she use a wound VAC bridged to both wounds just near the initial tuberosity's bilaterally which she refused. The history was a bit difficult to put together. Apparently this patient became ill at the end of April of this year. She was found sitting on the floor she had apparently been for 2 days and subsequently admitted to hospital from 04/23/16 through 05/02/16 and at that point she was critically ill ultimately having sepsis secondary to UTI, nontraumatic rhabdomyolysis and diabetic ketoacidosis. She had  acute renal failure and I think required ICU care including intubation. Patient states  her wounds actually started at that point on the bilateral issue tuberosities however in reviewing the discharge summary from 5/8 I see no reference to wounds at that point. It did state that she had left lower extremity cellulitis however. Reviewing Epic I see no relevant x-rays. It would appear that her discharge creatinine was within the normal range and indeed on Previti, Melessa L. ([id number]) 54/15 her creatinine has remained normal. [facility] There the wounds on her bilateral Botox were dressed. Only just before her discharge from the nursing facility she developed an "knot" which was interpreted as cellulitis on the posterior aspect of her left knee she was given antibiotics. Apparently sometime late in July a this actually opened and became a wound at home health care was tending to however she is still having purulent drainage coming from this and by my understanding the wound depth is actually become unmeasurable. I am not really clear about what home health has been placing in any of these wound areas. The patient states that is something with silver and it. She is not been systemically unwell no fever or chills her appetite is good. She is a diabetic poorly controlled however she states that her recent blood sugars at home have been in the low to mid 100s. 09/28/16 On evaluation today patient appears to continue to exhibit the 3 areas of ulceration that were noted previous. She did have an x-ray of the right pelvis which showed evidence of potential soft tissue infection but no obvious osteomyelitis. There was a discussion last office visit concerning the possibility of a wound VAC. Witth that being said the x-ray report suggested that an MRI may be more appropriate to further evaluate the extent. Subsequently in regard to the wound over the  popliteal portion of the left lower extremity with tunneling at 12:00 the CT scan that was ordered was denied by insurance as they state the patient has not had x-rays prior to advanced imaging. Patient states that she is frustrated with the situation overall. 10/05/16 in the interval since I last saw this patient last week she has had the x-ray of the knee performed. I did review that x-ray today and fortunately shows no evidence of osteomyelitis or other acute abnormality at this point in time. She continues to have the opening iin the posterior oral popliteal space with tracking proximal up the posterior thigh. Nothing seems to have worsened but it also seems to have not improved. The same is true in regard to the right pressure ulcer over the gluteal region which extends toward the ischium. The left gluteal pressure ulcer actually appears to be doing somewhat better my opinion there is some necrotic slough but overall this appears fairly well. She tells me thatt she has some discomfort especially when home health is helping her with dressing changes as they do not know her. At worse she rates her pain to be a 5 out of 10 right now it's more like a 1 out of 10. 10/07/16; still the patient has 3 different wound areas. She has a deep stage IV wound over her right ischial tuberosity. She is due to have an MRI next week. The wound over her left ischial tuberosity is more superficial and underwent debridement today. Finally she has a small open area in her left popliteal fossa the probes on measurably forward superiorly. Still a lot of drainage coming out of this. The CT scan that I ordered 3  weeks ago was questioned by her insurance company wanting a plain x-ray first. As I understand things result of this is nothing has been done in 3 weeks in terms of imaging the thigh and she has an MRI booked of this along with her pelvis for next week line 10/12/16; patient has a deep probing wound over the  right ischiall tuberosity, stage III wound over the left visual tuberosity and a draining sinus in her left popliteal fossa. None of this much different from when I saw this 3 weeks ago. We have been using silver rope to the right ischial wound and a draining area in the left popliteal fossa. Plain silver alginate to the area on the left ischial tuberosity 10/19/16; the patient's wounds are essentially unchanged although the area on the left lower gluteal is actually improved. Our intake nurse noted drainage from the right initial tuberosity probing wound as well as the draining area in the left popliteal fossa. Both of these were cultured. She had x-rays I think at the insistence of her insurance company on 09/23/16 x-ray of the pelvis was not particularly helpful she did have soft tissue air over the right lower pelvis although with the depth of this wound this is not surprising. An x-ray of her left knee did not show any specific abnormalities. We are still using silver alginate to these wound areas. Her MRI is booked for 10/27 10/26/16; cultures of the purulent drainage in her right initial tuberosity wound grew moderate Proteus and few staph aureus. The same organisms were cultured out of the left knee sinus tract posteriorly. The staph aureus is MRSA. I had started her on Augmentin last week I added doxycycline. The MRI of the left lower [name], [name] ([id number]) extremity and pelvis was finally done. The MRI of the femur showed subcutaneous soft tissue swelling edema fluid and myositis in the vastus lateralis muscle but no soft tissue abscess septic arthritis or osteomyelitis. MRI of the pelvis showed the left wound to be more expensive extending down to the bone there was osteomyelitis. Left hamstring tendons were also involved. No septic arthritis involving the hip. The decubitus ulcer on the right side showed no definite osteomyelitis or abscess.. The right hip wound is actually the one  the probes 6 cm downward. But the MRI showing infection including osteomyelitis on the left explains the draining sinus in the popliteal fossa on the left. She did have antibiotics in the hospitalization last time and this extended into her nursing home stay but I'm not exactly sure what antibiotics and for what duration. According the patient this did include vancomycin with considerable effort of our staff we are able to get the patient into see Dr. Sampson Goon today. There were transportation difficulties. Her mother had open heart surgery and is in the ICU in [place] therefore her brother was unable to transport. Dr. Jarrett Ables office graciously arranged time to see her today. From my point of view she is going to require IV vancomycin plus perhaps a third generation cephalosporin. I plan to keep her on doxycycline and Augmentin until the IV antibiotics can be arranged. 11/02/2016 - Capri presents today for management of ulcers; She saw Dr. 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 She is to have the PICC line placed today and receive the initial dose for both antibiotics today. She has yet to receive the offloading chair cushion and/or mattress overlay from home health, apparently  this has been a 3 week process. I encouraged her to speak to home health regarding this matter, along with offering home health to contact the wouns care center with any questions or concerns. The left ischial pressure ulcer continues to imporve, while to right ischial ulcer has increased in depth. The popliteal fossa sinus tract remains unmeasurable due to the limitation of depth measurement (tract extends beyond our measuring devices). 11-16-2016 Ms. Finkle presents today for evaluation and management of bilateral ischial stage IV pressure ulcers and sinus tract to the left popliteal fossa. she is under the care of Dr.  Sampson Goon for IV antibiotic therapy; she states that the vancomycin was placed on hold and will be restarted at a lower dose based on her renal function. She continues taking Zosyn in addition to the vancomycin. She also states that she has yet to receive offloading cushions from home health, according to her she does not qualify for these offloading cushions because "the ulcers are unstageable ". We will contact the home health agency today to lend clarity regarding her pressure ulcers. The left popliteal fossa sinus tract continues to be a measurable as it extends beyond the length of our measuring devices.. 11/30/16; the patient is still on vancomycin and Zosyn. The depth of the draining sinus behind her left popliteal fossa is down to 4 cm although there is still serosanguineous drainage coming out of this. She saw Dr. Sampson Goon of infectious disease yesterday the idea is to weeks more of IV antibiotics and then oral antibiotics although I have not read his note. The area on the left gluteal fold is just about healed. She has a 6 cm draining sinus over the right initial tuberosity although I cannot feel bone at the base of this. As far as the patient is aware she has not had a recheck of her inflammatory markers. 12/07/16; patient is on vancomycin and Zosyn appointment with Dr. Sampson Goon on the 19th at which point the patient expects to have a change in antibiotics. Remarkable improvement over the wound over the left ischial tuberosity which is just about closed. The draining sinus in her popliteal fossa has 0.4 cm in depth. The area on the right ischial tuberosity still probes down 7 cm. This is closed and overall wound dimensions but not depth. 12/14/16; patient is completing her vancomycin and Zosyn and per her she is going to transition to Bactrim and Augmentin for another 3 weeks. The area on her left gluteal fold is closed except for some skin tears. The area behind her left knee is  no longer has any depth. The only remaining area that is of clinical concern is on the right gluteal fold probing towards the right ischial tuberosity. Today this measures 6.9 cm in depth. Very gritty surface 12/21/16; patient is now on Bactrim and Augmentin as directed by infectious disease. This should be for [name], [name] ([id number]) another 2 weeks. The area in her left gluteal fold and left popliteal are closed over and fully healed. Measurements today at 7 cm in the right buttock wound is unchanged from last week. Electronic Signature(s) Signed: 12/21/2016 5:22:07 PM By: Baltazar Najjar MD Entered By: Baltazar Najjar on 12/21/2016 08:28:30 [place], [name] ([id number]) -------------------------------------------------------------------------------- Physical Exam Details Patient Name: [name] Date of Service: 12/21/2016 8:00 AM Medical Record Patient Account Number: [id number] Number: Treating RN: Huel Coventry August 24, 1961 (55 y.o. Other Clinician: Date of Birth/Sex: Female) Treating Janeal Abadi Primary Care Physician/Extender: Leticia Penna Physician: Referring Physician:  BLISS, [name] Weeks in Treatment: 13 Constitutional Sitting or standing Blood Pressure is within target range for patient.. Pulse regular and within target range for patient.Marland Kitchen Respirations regular, non-labored and within target range.. Temperature is normal and within the target range for the patient.. Patient's appearance is neat and clean. Appears in no acute distress. Well nourished and well developed.. Eyes Conjunctivae clear. No discharge.Marland Kitchen Respiratory Respiratory effort is easy and symmetric bilaterally. Rate is normal at rest and on room air.Marland Kitchen Lymphatic None palpable in the popliteal or inguinal area. Integumentary (Hair, Skin) No evidence of surrounding infection in the right gluteal. No soft tissue crepitus. Psychiatric No evidence of depression, anxiety, or agitation. Calm,  cooperative, and communicative. Appropriate interactions and affect.. Notes Wound exam; the left gluteal wound and a draining sinus in the left popliteal have both closed over. The area over the right visual tuberosity is still open although the actual orifice is smaller depth is roughly 7 cm there is no evidence of soft tissue surrounding infection. No soft tissue crepitus is observed Electronic Signature(s) Signed: 12/21/2016 5:22:07 PM By: Baltazar Najjar MD Entered By: Baltazar Najjar on 12/21/2016 08:31:37 [name], [name] ([id number]) -------------------------------------------------------------------------------- Physician Orders Details Patient Name: [name] Date of Service: 12/21/2016 8:00 AM Medical Record Patient Account Number: [id number] Number: Treating RN: Huel Coventry 1961-11-13 (55 y.o. Other Clinician: Date of Birth/Sex: Female) Treating Meri Pelot Primary Care Physician/Extender: [name], [name] Physician: Referring Physician: Tedra Senegal in Treatment: 81 Verbal / Phone Orders: Yes Clinician: Huel Coventry Read Back and Verified: Yes Diagnosis Coding Wound Cleansing Wound #3 Right Gluteal fold o Clean wound with Normal Saline. o Cleanse wound with mild soap and water Anesthetic Wound #3 Right Gluteal fold o Topical Lidocaine 4% cream applied to wound bed prior to debridement - clinic use only Skin Barriers/Peri-Wound Care Wound #3 Right Gluteal fold o Skin Prep Primary Wound Dressing Wound #3 Right Gluteal fold o Aquacel Ag - or equivalent to Silver Alginate Rope pack lightly into wound and leave a tail where it can be pulled out and to cover the outside of the wound. Secondary Dressing Wound #3 Right Gluteal fold o Dry Gauze o Non-adherent pad - Telfa island, tape, or BFD Dressing Change Frequency Wound #3 Right Gluteal fold o Change dressing every other day. Follow-up Appointments Wound #3 Right Gluteal  fold o Return Appointment in 1 week. Off-Loading [name], [name] ([id number]) Wound #3 Right Gluteal fold o Roho cushion for wheelchair - *********[facility] to order*********** Home Health Wound #3 Right Gluteal fold o Continue Home Health Visits - [facility] *****Please order ROHO cushion****** o Home Health Nurse may visit PRN to address patientos wound care needs. o FACE TO FACE ENCOUNTER: MEDICARE and MEDICAID PATIENTS: I certify that this patient is under my care and that I had a face-to-face encounter that meets the physician face-to-face encounter requirements with this patient on this date. The encounter with the patient was in whole or in part for the following MEDICAL CONDITION: (primary reason for Home Healthcare) MEDICAL NECESSITY: I certify, that based on my findings, NURSING services are a medically necessary home health service. HOME BOUND STATUS: I certify that my clinical findings support that this patient is homebound (i.e., Due to illness or injury, pt requires aid of supportive devices such as crutches, cane, wheelchairs, walkers, the use of special transportation or the assistance of another person to leave their place of residence. There is a normal inability to leave the  home and doing so requires considerable and taxing effort. Other absences are for medical reasons / religious services and are infrequent or of short duration when for other reasons). o If current dressing causes regression in wound condition, may D/C ordered dressing product/s and apply Normal Saline Moist Dressing daily until next Wound Healing Center / Other MD appointment. Notify Wound Healing Center of regression in wound condition at [phone]. o Please direct any NON-WOUND related issues/requests for orders to patient's Primary Care Physician Negative Pressure Wound Therapy Wound #3 Right Gluteal fold o Wound VAC settings at 125/130 mmHg continuous  pressure. Use BLACK/GREEN foam to wound cavity. Use WHITE foam to fill any tunnel/s and/or undermining. Change VAC dressing 3 X WEEK. Change canister as indicated when full. Nurse may titrate settings and frequency of dressing changes as clinically indicated. - Please order this after Christmas per pts request. MD wants to start the wound vac. o Home Health Nurse may d/c VAC for s/s of increased infection, significant wound regression, or uncontrolled drainage. Notify Wound Healing Center at [phone]. o Number of foam/gauze pieces used in the dressing = Medications-please add to medication list. Wound #3 Right Gluteal fold o P.O. Antibiotics - Bactrim and Augmentin o Other: - Vitamin C, Zinc, Multivitamins IV antibiotics Electronic Signature(s) Signed: 12/21/2016 5:22:07 PM By: Baltazar Najjar MD Signed: 12/21/2016 6:06:27 PM By: Elliot Gurney RN, BSN, Kim RN, BSN [name], [name] ([id number]) Entered By: Elliot Gurney, RN, BSN, Kim on 12/21/2016 08:24:16 [place], [name] ([id number]) -------------------------------------------------------------------------------- Problem List Details Patient Name: [name] Date of Service: 12/21/2016 8:00 AM Medical Record Patient Account Number: [id number] Number: Treating RN: Huel Coventry 01/13/1961 (55 y.o. 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 09/21/2016 09/21/2016 severity Resolved Problems ICD-10 Code Description Active Date Resolved Date L89.323 Pressure ulcer of left buttock, stage 3 09/21/2016 09/21/2016 Electronic Signature(s) Signed: 12/21/2016 5:22:07 PM By: Baltazar Najjar MD Ben[place], [name] ([id number]) Entered By: Baltazar Najjar on 12/21/2016 08:24:57 [place], [name] ([id number]) -------------------------------------------------------------------------------- Progress Note Details Patient Name: [name] Date of Service: 12/21/2016 8:00 AM Medical Record Patient Account Number: [id number] Number: Treating RN: Huel Coventry Dec 09, 1961 (55 y.o. Other Clinician: Date of Birth/Sex: Female) Treating Advay Volante Primary Care Physician/Extender: Leticia Penna Physician: Referring Physician: Tedra Senegal in Treatment: 13 Subjective Chief Complaint Information obtained from Patient Ms. Zeller presents today for routine managament of bilateral ischial pressure ulcers and sinus tract to left popliteal fossa. History of Present Illness (HPI) The following HPI elements were documented for the patient's wound: Location: bilateral gluteal ulcerations and left popliteal ulceration with tunneling Quality: adits to intermittent aching to ischial ulcers, no discomfort to sinus tract Severity: right iscial ulcer with increased depth Duration: chronic ulcers to bilateral ischial ulcers and sinus tract Timing: The pain is intermittent in severity as far as how intense it becomes but is present all the time. Manipulationn makes this worse. Context: The wound occurred when the patient had a fall and was unconscious for about 48 hours laying  on the floor and she had pressure injury at that stage. Modifying Factors: Other treatment(s) tried include:as noted below she has been seen by visiting wound care physicians or nurse practitioners and details have been noted Associated Signs and  Symptoms: has yet to receive offloading cushions and mattress overlay 55 year old patient who was seen by visiting [name] wound care specialist for a wound on both her buttock and was found to have an unstageable wound on the right buttock for about 2 months. I understand that she had a fall and was laying on the floor for about 48 hours before she was found and taken to the ICU and had a long injury to her gluteal area from pressure and also had broken her right humerus. She has had a right proximal humerus fracture and has been followed up with orthopedics recently. The patient has a past medical history of type 2 diabetes mellitus, paraparesis, acute pyelonephritis, GERD, hypertension, glaucoma, chronic pain, anxiety neurosis, nicotine dependence, COPD. the patient had some debridement done and was to operative was recommended to use Silvadene dressing and offloading. She is a smoker and occasionally smokes a few cigarettes. the patient requested a second opinion for months and is here to discuss her care. 54/27/17; the patient re-presents from home today for review of 3 different wounds. I note that she was seen in the clinic here in July at which time she had bilateral buttock wounds. It was apparently suggested at that time that she use a wound VAC bridged to both wounds just near the initial tuberosity's bilaterally which she refused. [name], [name] ([id number]) The history was a bit difficult to put together. Apparently this patient became ill at the end of April of this year. She was found sitting on the floor she had apparently been for 2 days and subsequently admitted to hospital from 04/23/16 through 05/02/16 and at that point she was critically ill ultimately having sepsis secondary to UTI, nontraumatic rhabdomyolysis and diabetic ketoacidosis. She had acute renal failure and I think required ICU care including intubation. Patient states her wounds actually started at that point on  the bilateral issue tuberosities however in reviewing the discharge summary from 5/8 I see no reference to wounds at that point. It did state that she had left lower extremity cellulitis however. Reviewing Epic I see no relevant x-rays. It would appear that her discharge creatinine was within the normal range and indeed on 54/15 her creatinine has remained normal. [facility] There the wounds on her bilateral Botox were dressed. Only just before her discharge from the nursing facility she developed an "knot" which was interpreted as cellulitis on the posterior aspect of her left knee she was given antibiotics. Apparently sometime late in July a this actually opened and became a wound at home health care was tending to however she is still having purulent drainage coming from this and by my understanding the wound depth is actually become unmeasurable. I am not really clear about what home health has been placing in any of these wound areas. The patient states that is something with silver and it. She is not been systemically unwell no fever or chills her appetite is good. She is a diabetic poorly controlled however she states that her recent blood sugars at home have been in the low to mid 100s. 09/28/16 On evaluation today patient appears to continue to exhibit the 3 areas of ulceration that were noted previous. She did  have an x-ray of the right pelvis which showed evidence of potential soft tissue infection but no obvious osteomyelitis. There was a discussion last office visit concerning the possibility of a wound VAC. Witth that being said the x-ray report suggested that an MRI may be more appropriate to further evaluate the extent. Subsequently in regard to the wound over the popliteal portion of the left lower extremity with tunneling at 12:00 the CT scan that was ordered was denied by insurance as they state the patient has not had  x-rays prior to advanced imaging. Patient states that she is frustrated with the situation overall. 10/05/16 in the interval since I last saw this patient last week she has had the x-ray of the knee performed. I did review that x-ray today and fortunately shows no evidence of osteomyelitis or other acute abnormality at this point in time. She continues to have the opening iin the posterior oral popliteal space with tracking proximal up the posterior thigh. Nothing seems to have worsened but it also seems to have not improved. The same is true in regard to the right pressure ulcer over the gluteal region which extends toward the ischium. The left gluteal pressure ulcer actually appears to be doing somewhat better my opinion there is some necrotic slough but overall this appears fairly well. She tells me thatt she has some discomfort especially when home health is helping her with dressing changes as they do not know her. At worse she rates her pain to be a 5 out of 10 right now it's more like a 1 out of 10. 10/07/16; still the patient has 3 different wound areas. She has a deep stage IV wound over her right ischial tuberosity. She is due to have an MRI next week. The wound over her left ischial tuberosity is more superficial and underwent debridement today. Finally she has a small open area in her left popliteal fossa the probes on measurably forward superiorly. Still a lot of drainage coming out of this. The CT scan that I ordered 3 weeks ago was questioned by her insurance company wanting a plain x-ray first. As I understand things result of this is nothing has been done in 3 weeks in terms of imaging the thigh and she has an MRI booked of this along with her pelvis for next week line 10/12/16; patient has a deep probing wound over the right ischiall tuberosity, stage III wound over the left visual tuberosity and a draining sinus in her left popliteal fossa. None of this much different from when  I saw this 3 weeks ago. We have been using silver rope to the right ischial wound and a draining area in the left popliteal fossa. Plain silver alginate to the area on the left ischial tuberosity 10/19/16; the patient's wounds are essentially unchanged although the area on the left lower gluteal is Broughton, Kieran L. ([id number]) actually improved. Our intake nurse noted drainage from the right initial tuberosity probing wound as well as the draining area in the left popliteal fossa. Both of these were cultured. She had x-rays I think at the insistence of her insurance company on 09/23/16 x-ray of the pelvis was not particularly helpful she did have soft tissue air over the right lower pelvis although with the depth of this wound this is not surprising. An x-ray of her left knee did not show any specific abnormalities. We are still using silver alginate to these wound areas. Her MRI is booked for 10/27 10/26/16; cultures of  the purulent drainage in her right initial tuberosity wound grew moderate Proteus and few staph aureus. The same organisms were cultured out of the left knee sinus tract posteriorly. The staph aureus is MRSA. I had started her on Augmentin last week I added doxycycline. The MRI of the left lower extremity and pelvis was finally done. The MRI of the femur showed subcutaneous soft tissue swelling edema fluid and myositis in the vastus lateralis muscle but no soft tissue abscess septic arthritis or osteomyelitis. MRI of the pelvis showed the left wound to be more expensive extending down to the bone there was osteomyelitis. Left hamstring tendons were also involved. No septic arthritis involving the hip. The decubitus ulcer on the right side showed no definite osteomyelitis or abscess.. The right hip wound is actually the one the probes 6 cm downward. But the MRI showing infection including osteomyelitis on the left explains the draining sinus in the popliteal fossa on the left. She did  have antibiotics in the hospitalization last time and this extended into her nursing home stay but I'm not exactly sure what antibiotics and for what duration. According the patient this did include vancomycin with considerable effort of our staff we are able to get the patient into see Dr. Sampson Goon today. There were transportation difficulties. Her mother had open heart surgery and is in the ICU in [place] therefore her brother was unable to transport. Dr. Jarrett Ables office graciously arranged time to see her today. From my point of view she is going to require IV vancomycin plus perhaps a third generation cephalosporin. I plan to keep her on doxycycline and Augmentin until the IV antibiotics can be arranged. 11/02/2016 - Jennel presents today for management of ulcers; She saw Dr. 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 She is to have the PICC line placed today and receive the initial dose for both antibiotics today. She has yet to receive the offloading chair cushion and/or mattress overlay from home health, apparently this has been a 3 week process. I encouraged her to speak to home health regarding this matter, along with offering home health to contact the wouns care center with any questions or concerns. The left ischial pressure ulcer continues to imporve, while to right ischial ulcer has increased in depth. The popliteal fossa sinus tract remains unmeasurable due to the limitation of depth measurement (tract extends beyond our measuring devices). 11-16-2016 Ms. Allegretto presents today for evaluation and management of bilateral ischial stage IV pressure ulcers and sinus tract to the left popliteal fossa. she is under the care of Dr. Sampson Goon for IV antibiotic therapy; she states that the vancomycin was placed on hold and will be restarted at a lower dose based on her renal function. She  continues taking Zosyn in addition to the vancomycin. She also states that she has yet to receive offloading cushions from home health, according to her she does not qualify for these offloading cushions because "the ulcers are unstageable ". We will contact the home health agency today to lend clarity regarding her pressure ulcers. The left popliteal fossa sinus tract continues to be a measurable as it extends beyond the length of our measuring devices.. 11/30/16; the patient is still on vancomycin and Zosyn. The depth of the draining sinus behind her left popliteal fossa is down to 4 cm although there is still serosanguineous drainage coming out of this. She saw Dr. Sampson Goon of infectious disease yesterday  the idea is to weeks more of IV antibiotics and then oral antibiotics although I have not read his note. The area on the left gluteal fold is just about healed. She has a 6 cm draining sinus over the right initial tuberosity although I cannot feel bone at the base of this. As far as the patient is aware she has not had a recheck of her inflammatory markers. 12/07/16; patient is on vancomycin and Zosyn appointment with Dr. Sampson Goon on the 19th at which point the patient expects to have a change in antibiotics. Remarkable improvement over the wound over the left [name], [name] ([id number]) ischial tuberosity which is just about closed. The draining sinus in her popliteal fossa has 0.4 cm in depth. The area on the right ischial tuberosity still probes down 7 cm. This is closed and overall wound dimensions but not depth. 12/14/16; patient is completing her vancomycin and Zosyn and per her she is going to transition to Bactrim and Augmentin for another 3 weeks. The area on her left gluteal fold is closed except for some skin tears. The area behind her left knee is no longer has any depth. The only remaining area that is of clinical concern is on the right gluteal fold probing towards the right  ischial tuberosity. Today this measures 6.9 cm in depth. Very gritty surface 12/21/16; patient is now on Bactrim and Augmentin as directed by infectious disease. This should be for another 2 weeks. The area in her left gluteal fold and left popliteal are closed over and fully healed. Measurements today at 7 cm in the right buttock wound is unchanged from last week. Objective Constitutional Sitting or standing Blood Pressure is within target range for patient.. Pulse regular and within target range for patient.Marland Kitchen Respirations regular, non-labored and within target range.. Temperature is normal and within the target range for the patient.. Patient's appearance is neat and clean. Appears in no acute distress. Well nourished and well developed.. Vitals Time Taken: 8:12 AM, Height: 63 in, Weight: 257 lbs, BMI: 45.5, Temperature: 98 F, Pulse: 95 bpm, Respiratory Rate: 16 breaths/min, Blood Pressure: 138/74 mmHg. Eyes Conjunctivae clear. No discharge.Marland Kitchen Respiratory Respiratory effort is easy and symmetric bilaterally. Rate is normal at rest and on room air.Marland Kitchen Lymphatic None palpable in the popliteal or inguinal area. Psychiatric No evidence of depression, anxiety, or agitation. Calm, cooperative, and communicative. Appropriate interactions and affect.. General Notes: Wound exam; the left gluteal wound and a draining sinus in the left popliteal have both closed over. The area over the right visual tuberosity is still open although the actual orifice is smaller depth is roughly 7 cm there is no evidence of soft tissue surrounding infection. No soft tissue crepitus is observed Integumentary (Hair, Skin) No evidence of surrounding infection in the right gluteal. No soft tissue crepitus. [name], [name] ([id number]) Wound #3 status is Open. Original cause of wound was Pressure Injury. The wound is located on the Right Gluteal fold. The wound measures 0.9cm length x 0.6cm width x 6.5cm depth; 0.424cm^2  area and 2.757cm^3 volume. There is fat exposed. There is tunneling at 10:00 with a maximum distance of 6cm. There is a large amount of serous drainage noted. The wound margin is distinct with the outline attached to the wound base. There is no granulation within the wound bed. There is no necrotic tissue within the wound bed. The periwound skin appearance exhibited: Scarring, Maceration, Moist. The periwound skin appearance did not exhibit: Callus, Crepitus, Excoriation, Fluctuance, Friable, Induration,  Localized Edema, Rash, Dry/Scaly, Atrophie Blanche, Cyanosis, Ecchymosis, Hemosiderin Staining, Mottled, Pallor, Rubor, Erythema. Periwound temperature was noted as No Abnormality. The periwound has tenderness on palpation. General Notes: unable to visualize wound depth. Wound #4 status is Healed - Epithelialized. Original cause of wound was Pressure Injury. The wound is located on the Left Gluteal fold. The wound measures 0cm length x 0cm width x 0cm depth; 0cm^2 area and 0cm^3 volume. There is muscle and fat exposed. There is no tunneling or undermining noted. There is a small amount of serosanguineous drainage noted. The wound margin is distinct with the outline attached to the wound base. There is no granulation within the wound bed. There is no necrotic tissue within the wound bed. The periwound skin appearance exhibited: Scarring, Dry/Scaly. The periwound skin appearance did not exhibit: Callus, Crepitus, Excoriation, Fluctuance, Friable, Induration, Localized Edema, Rash, Maceration, Moist, Atrophie Blanche, Cyanosis, Ecchymosis, Hemosiderin Staining, Mottled, Pallor, Rubor, Erythema. Periwound temperature was noted as No Abnormality. Wound #5 status is Healed - Epithelialized. Original cause of wound was Gradually Appeared. The wound is located on the Left,Posterior Knee. The wound measures 0cm length x 0cm width x 0cm depth; 0cm^2 area and 0cm^3 volume. There is no tunneling or  undermining noted. There is a small amount of serous drainage noted. The wound margin is flat and intact. There is no granulation within the wound bed. There is no necrotic tissue within the wound bed. The periwound skin appearance exhibited: Scarring, Dry/Scaly. The periwound skin appearance did not exhibit: Callus, Crepitus, Excoriation, Fluctuance, Friable, Induration, Localized Edema, Rash, Maceration, Moist, Atrophie Blanche, Cyanosis, Ecchymosis, Hemosiderin Staining, Mottled, Pallor, Rubor, Erythema. Periwound temperature was noted as Hot. The periwound has tenderness on palpation. 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 ([id number]) Wound Cleansing: Wound #3 Right Gluteal fold: Clean wound with Normal Saline. Cleanse wound with mild soap and water Anesthetic: Wound #3 Right Gluteal fold: Topical Lidocaine 4% cream applied to wound bed prior to debridement - clinic use only Skin Barriers/Peri-Wound Care: Wound #3 Right Gluteal fold: Skin Prep Primary Wound Dressing: Wound #3 Right Gluteal fold: Aquacel Ag - or equivalent to Silver Alginate Rope pack lightly into wound and leave a tail where it can be pulled out and to cover the outside of the wound. Secondary Dressing: Wound #3 Right Gluteal fold: Dry Gauze Non-adherent pad - Telfa island, tape, or BFD Dressing Change Frequency: Wound #3 Right Gluteal fold: Change dressing every other day. Follow-up Appointments: Wound #3 Right Gluteal fold: Return Appointment in 1 week. Off-Loading: Wound #3 Right Gluteal fold: Roho cushion for wheelchair - *********[facility] to order*********** Home Health: Wound #3 Right Gluteal fold: Continue Home Health Visits - [facility] *****Please order [facility] cushion****** Home Health Nurse may visit  PRN to address patient s wound care needs. FACE TO FACE ENCOUNTER: MEDICARE and MEDICAID PATIENTS: I certify that this patient is under my care and that I had a face-to-face encounter that meets the physician face-to-face encounter requirements with this patient on this date. The encounter with the patient was in whole or in part for the following MEDICAL CONDITION: (primary reason for Home Healthcare) MEDICAL NECESSITY: I certify, that based on my findings, NURSING services are a medically necessary home health service. HOME BOUND STATUS: I certify that my clinical findings support that this patient is homebound (i.e.,  Due to illness or injury, pt requires aid of supportive devices such as crutches, cane, wheelchairs, walkers, the use of special transportation or the assistance of another person to leave their place of residence. There is a normal inability to leave the home and doing so requires considerable and taxing effort. Other absences are for medical reasons / religious services and are infrequent or of short duration when for other reasons). If current dressing causes regression in wound condition, may D/C ordered dressing product/s and apply Normal Saline Moist Dressing daily until next Wound Healing Center / Other MD appointment. Notify Wound Healing Center of regression in wound condition at [phone]. Please direct any NON-WOUND related issues/requests for orders to patient's Primary Care Physician Negative Pressure Wound Therapy: Wound #3 Right Gluteal fold: Wound VAC settings at 125/130 mmHg continuous pressure. Use BLACK/GREEN foam to wound cavity. Use WHITE foam to fill any tunnel/s and/or undermining. Change VAC dressing 3 X WEEK. Change canister as indicated when full. Nurse may titrate settings and frequency of dressing changes as clinically [name], [name] ([id number]) indicated. - Please order this after Christmas per pts request. MD wants to start the wound vac. Home  Health Nurse may d/c VAC for s/s of increased infection, significant wound regression, or uncontrolled drainage. Notify Wound Healing Center at [phone]. Number of foam/gauze pieces used in the dressing = Medications-please add to medication list.: Wound #3 Right Gluteal fold: P.O. Antibiotics - Bactrim and Augmentin Other: - Vitamin C, Zinc, Multivitamins IV antibiotics #1 the orifice of the remaining wound on the right gluteal is too small for a wound VAC at this point. We will have to continue with some sort of packing. #2 the orifice of the wound actually is smaller however the depth is exactly the same as last week. We've been using silver alginate. I wonder if this is going to closed over with some form of recess underneath this. This is less than ideal #3 underlying osteomyelitis on the left and right. I suspect these wounds were actually draining sinuses as well as pressure areas #4 the patient has disabling MS, apparently her balance currently he is really off and she cannot walk. She is applying for disability at work which seems a very reasonable thing Psychologist, prison and probation services) Signed: 12/23/2016 5:38:44 PM By: Elliot Gurney RN, BSN, Kim RN, BSN Signed: 12/27/2016 10:04:56 AM By: Baltazar Najjar MD Previous Signature: 12/21/2016 5:22:07 PM Version By: Baltazar Najjar MD Entered By: Elliot Gurney RN, BSN, Kim on 12/23/2016 17:38:01 Satchell, [name] ([id number]) -------------------------------------------------------------------------------- SuperBill Details Patient Name: [name] Date of Service: 12/21/2016 Medical Record Patient Account Number: [id number] Number: Treating RN: Huel Coventry [birth year] (55 y.o. Other Clinician: Date of Birth/Sex: Female) Treating Azalya Galyon Primary Care Physician/Extender: Leticia Penna Physician: Weeks in Treatment: 13 Referring Physician: Joen [name] Diagnosis Coding ICD-10 Codes Code Description L89.314 Pressure ulcer of right  buttock, stage 4 M86.18 Other acute osteomyelitis, other site E11.42 Type 2 diabetes mellitus with diabetic polyneuropathy Facility Procedures CPT4 Code: 95284132 Description: 44010 - WOUND CARE VISIT-LEV 2 EST PT Modifier: Quantity: 1 Physician Procedures CPT4 Code: [id number] Description: 99213 - WC PHYS LEVEL 3 - EST PT ICD-10 Description Diagnosis L89.314 Pressure ulcer of right buttock, stage 4 M86.18 Other acute osteomyelitis, other site Modifier: Quantity: 1 Electronic Signature(s) Signed: 12/21/2016 5:22:07 PM By: Baltazar Najjar MD Entered By: Baltazar Najjar on 12/21/2016 08:30:47

## 2016-12-27 ENCOUNTER — Encounter: Payer: Managed Care, Other (non HMO) | Attending: Internal Medicine | Admitting: Internal Medicine

## 2016-12-27 DIAGNOSIS — K219 Gastro-esophageal reflux disease without esophagitis: Secondary | ICD-10-CM | POA: Diagnosis not present

## 2016-12-27 DIAGNOSIS — F411 Generalized anxiety disorder: Secondary | ICD-10-CM | POA: Diagnosis not present

## 2016-12-27 DIAGNOSIS — I10 Essential (primary) hypertension: Secondary | ICD-10-CM | POA: Diagnosis not present

## 2016-12-27 DIAGNOSIS — L89314 Pressure ulcer of right buttock, stage 4: Secondary | ICD-10-CM | POA: Insufficient documentation

## 2016-12-27 DIAGNOSIS — F17210 Nicotine dependence, cigarettes, uncomplicated: Secondary | ICD-10-CM | POA: Diagnosis not present

## 2016-12-27 DIAGNOSIS — E1142 Type 2 diabetes mellitus with diabetic polyneuropathy: Secondary | ICD-10-CM | POA: Insufficient documentation

## 2016-12-27 DIAGNOSIS — M8618 Other acute osteomyelitis, other site: Secondary | ICD-10-CM | POA: Insufficient documentation

## 2016-12-28 NOTE — Progress Notes (Addendum)
LAMYAH, CREED ([id number]) Visit Report for 12/27/2016 Arrival Information Details Patient Name: [name], [name] Date of Service: 12/27/2016 8:00 AM Medical Record Patient Account Number: [id number] Number: Treating RN: Ahmed Prima [birth year] (56 y.o. Other Clinician: Date of Birth/Sex: Female) Treating ROBSON, MICHAEL Primary Care Physician: Lavera Guise Physician/Extender: G Referring Physician: Sallee Lange in Treatment: 13 Visit Information History Since Last Visit All ordered tests and consults were completed: No Patient Arrived: Wheel Chair Added or deleted any medications: No Arrival Time: 08:02 Any new allergies or adverse reactions: No Accompanied By: self Had a fall or experienced change in No activities of daily living that may affect Transfer Assistance: None risk of falls: Patient Identification Verified: Yes Signs or symptoms of abuse/neglect since last No Secondary Verification Process Yes visito Completed: Hospitalized since last visit: No Patient Requires Transmission-Based No Has Dressing in Place as Prescribed: Yes Precautions: Pain Present Now: No Patient Has Alerts: Yes Patient Alerts: DM II Electronic Signature(s) Signed: 12/27/2016 4:44:28 PM By: Alric Quan Entered By: Alric Quan on 12/27/2016 08:14:14 Furia, [place]oldenMarland Kitchen ([id number]) -------------------------------------------------------------------------------- Complex / Palliative Patient Assessment Details Patient Name: [name] Date of Service: 12/27/2016 8:00 AM Medical Record Patient Account Number: [id number] Number: Treating RN: Cornell Barman 1961/12/12 (56 y.o. Other Clinician: Date of Birth/Sex: Female) Treating ROBSON, MICHAEL Primary Care Physician: Lavera Guise Physician/Extender: G Referring Physician: Sallee Lange in Treatment: 13 Palliative Management Criteria Complex Wound Management Criteria Patient has remarkable or complex  co-morbidities requiring medications or treatments that extend wound healing times. Examples: o Diabetes mellitus with chronic renal failure or end stage renal disease requiring dialysis o Advanced or poorly controlled rheumatoid arthritis o Diabetes mellitus and end stage chronic obstructive pulmonary disease o Active cancer with current chemo- or radiation therapy DM, COPD, wheelchair bound Care Approach Wound Care Plan: Complex Wound Management Electronic Signature(s) Signed: 12/28/2016 12:51:17 PM By: Gretta Cool RN, BSN, Kim RN, BSN Signed: 12/28/2016 5:37:12 PM By: Linton Ham MD Entered By: Gretta Cool, RN, BSN, Kim on 12/28/2016 12:51:17 Nipper, [name] ([id number]) -------------------------------------------------------------------------------- Encounter Discharge Information Details Patient Name: [name] Date of Service: 12/27/2016 8:00 AM Medical Record Patient Account Number: [id number] Number: Treating RN: Ahmed Prima June 19, 1961 (56 y.o. Other Clinician: Date of Birth/Sex: Female) Treating ROBSON, MICHAEL Primary Care Physician: Lavera Guise Physician/Extender: G Referring Physician: Sallee Lange in Treatment: 13 Encounter Discharge Information Items Discharge Pain Level: 0 Discharge Condition: Stable Ambulatory Status: Wheelchair Discharge Destination: Home Transportation: Other Accompanied By: self Schedule Follow-up Appointment: Yes Medication Reconciliation completed Yes and provided to Patient/Care Marialena Wollen: Patient Clinical Summary of Care: Declined Electronic Signature(s) Signed: 12/27/2016 8:48:25 AM By: Ruthine Dose Entered By: Ruthine Dose on 12/27/2016 08:48:25 Catania, [name] ([id number]) -------------------------------------------------------------------------------- Lower Extremity Assessment Details Patient Name: [name] Date of Service: 12/27/2016 8:00 AM Medical Record Patient Account Number:  [id number] Number: Treating RN: Ahmed Prima [birth year] (56 y.o. Other Clinician: Date of Birth/Sex: Female) Treating ROBSON, MICHAEL Primary Care Physician: Lavera Guise Physician/Extender: G Referring Physician: Sallee Lange in Treatment: 13 Electronic Signature(s) Signed: 12/27/2016 4:44:28 PM By: Alric Quan Entered By: Alric Quan on 12/27/2016 08:15:42 Forbess, Keiarah Carlean Jews ([id number]) -------------------------------------------------------------------------------- Multi Wound Chart Details Patient Name: [name] Date of Service: 12/27/2016 8:00 AM Medical Record Patient Account Number: [id number] Number: Treating RN: Ahmed Prima 27-Dec-1960 (56 y.o. Other Clinician: Date of Birth/Sex: Female) Treating ROBSON, MICHAEL Primary Care Physician: Lavera Guise Physician/Extender: G Referring Physician: Sallee Lange in Treatment: 13  Vital Signs Height(in): 63 Pulse(bpm): 74 Weight(lbs): 257 Blood Pressure 155/67 (mmHg): Body Mass Index(BMI): 46 Temperature(F): 98.2 Respiratory Rate 18 (breaths/min): Photos: [3:No Photos] [N/A:N/A] Wound Location: [3:Right Gluteal fold] [N/A:N/A] Wounding Event: [3:Pressure Injury] [N/A:N/A] Primary Etiology: [3:Pressure Ulcer] [N/A:N/A] Comorbid History: [3:Asthma, Hypertension, Type II Diabetes, Neuropathy] [N/A:N/A] Date Acquired: [3:04/21/2016] [N/A:N/A] Weeks of Treatment: [3:13] [N/A:N/A] Wound Status: [3:Open] [N/A:N/A] Measurements L x W x D 0.9x0.6x6.5 [N/A:N/A] (cm) Area (cm) : [3:0.424] [N/A:N/A] Volume (cm) : [3:2.757] [N/A:N/A] % Reduction in Area: [3:86.20%] [N/A:N/A] % Reduction in Volume: 84.00% [N/A:N/A] Classification: [3:Category/Stage IV] [N/A:N/A] Exudate Amount: [3:Large] [N/A:N/A] Exudate Type: [3:Serous] [N/A:N/A] Exudate Color: [3:amber] [N/A:N/A] Foul Odor After [3:Yes] [N/A:N/A] Cleansing: Odor Anticipated Due to No [N/A:N/A] Product Use: Wound Margin:  [3:Distinct, outline attached] [N/A:N/A] Granulation Amount: [3:Medium (34-66%)] [N/A:N/A] Necrotic Amount: [3:Medium (34-66%)] [N/A:N/A] Exposed Structures: [3:Fat: Yes] [N/A:N/A] Epithelialization: None N/A N/A Debridement: Debridement ([id number]- N/A N/A [id number]) Pre-procedure 08:27 N/A N/A Verification/Time Out Taken: Pain Control: Lidocaine 4% Topical N/A N/A Solution Tissue Debrided: Fibrin/Slough, Exudates, N/A N/A Subcutaneous Level: Skin/Subcutaneous N/A N/A Tissue Debridement Area (sq 0.54 N/A N/A cm): Instrument: Other(scoop) N/A N/A Bleeding: Minimum N/A N/A Hemostasis Achieved: Pressure N/A N/A Procedural Pain: 0 N/A N/A Post Procedural Pain: 0 N/A N/A Debridement Treatment Procedure was tolerated N/A N/A Response: well Post Debridement 0.9x0.6x6.5 N/A N/A Measurements L x W x D (cm) Post Debridement 2.757 N/A N/A Volume: (cm) Post Debridement Category/Stage IV N/A N/A Stage: Periwound Skin Texture: Scarring: Yes N/A N/A Edema: No Excoriation: No Induration: No Callus: No Crepitus: No Fluctuance: No Friable: No Rash: No Periwound Skin Maceration: Yes N/A N/A Moisture: Moist: Yes Dry/Scaly: No Periwound Skin Color: Atrophie Blanche: No N/A N/A Cyanosis: No Ecchymosis: No Erythema: No Hemosiderin Staining: No Mottled: No Pallor: No Rubor: No Temperature: No Abnormality N/A N/A Yes N/A N/A Robel, Ailie L. ([id number]) Tenderness on Palpation: Wound Preparation: Ulcer Cleansing: N/A N/A Rinsed/Irrigated with Saline Topical Anesthetic Applied: Other: lidocaine 4% Procedures Performed: Debridement N/A N/A Treatment Notes Wound #3 (Right Gluteal fold) 1. Cleansed with: Clean wound with Normal Saline Cleanse wound with antibacterial soap and water 2. Anesthetic Topical Lidocaine 4% cream to wound bed prior to debridement 3. Peri-wound Care: Skin Prep 4. Dressing Applied: Aquacel Ag 5. Secondary Dressing Applied Dry Gauze [place] Notes Aquacel Ag rope Electronic Signature(s) Signed: 12/27/2016 4:57:22 PM By: Linton Ham MD Entered By: Linton Ham on 12/27/2016 09:10:03 Bernick, [name] ([id number]) -------------------------------------------------------------------------------- [place] Details Patient Name: [name] Date of Service: 12/27/2016 8:00 AM Medical Record Patient Account Number: [id number] Number: Treating RN: Ahmed Prima Feb 04, 1961 (56 y.o. Other Clinician: Date of Birth/Sex: Female) Treating ROBSON, [place] Primary Care Physician: Lavera Guise Physician/Extender: G Referring Physician: Sallee Lange in Treatment: 13 Active Inactive Abuse / Safety / Falls / Self Care Management Nursing Diagnoses: Potential for falls Goals: Patient will remain injury free Date Initiated: 09/21/2016 Goal Status: Active Interventions: Assess fall risk on admission and as needed Notes: Nutrition Nursing Diagnoses: Imbalanced nutrition Potential for alteratiion in Nutrition/Potential for imbalanced nutrition Goals: Patient/caregiver agrees to and verbalizes understanding of need to use nutritional supplements and/or vitamins as prescribed Date Initiated: 09/21/2016 Goal Status: Active Patient/caregiver verbalizes understanding of need to maintain therapeutic glucose control per primary care physician Date Initiated: 09/21/2016 Goal Status: Active Interventions: Assess patient nutrition upon admission and as needed per policy Notes: Lipsey, Kyleen L. ([id number]) Orientation to the Wound Care Program Nursing Diagnoses: Knowledge deficit related to the wound healing  center program Goals: Patient/caregiver will verbalize understanding of the [place] [facility] Date Initiated: 09/21/2016 Goal Status: Active Interventions: Provide education on orientation to the wound center Notes: Pain, Acute or Chronic Nursing Diagnoses: Pain, acute or  chronic: actual or potential Potential alteration in comfort, pain Goals: Patient will verbalize adequate pain control and receive pain control interventions during procedures as needed Date Initiated: 09/21/2016 Goal Status: Active Patient/caregiver will verbalize adequate pain control between visits Date Initiated: 09/21/2016 Goal Status: Active Patient/caregiver will verbalize comfort level met Date Initiated: 09/21/2016 Goal Status: Active Interventions: Assess comfort goal upon admission Complete pain assessment as per visit requirements Notes: Wound/Skin Impairment Nursing Diagnoses: Impaired tissue integrity Goals: Ulcer/skin breakdown will have a volume reduction of 30% by week 4 Date Initiated: 09/21/2016 AMARRA, SAWYER ([id number]) Goal Status: Active Ulcer/skin breakdown will have a volume reduction of 50% by week 8 Date Initiated: 09/21/2016 Goal Status: Active Ulcer/skin breakdown will have a volume reduction of 80% by week 12 Date Initiated: 09/21/2016 Goal Status: Active Interventions: Assess ulceration(s) every visit Notes: Electronic Signature(s) Signed: 12/27/2016 4:44:28 PM By: Alric Quan Entered By: Alric Quan on 12/27/2016 08:22:53 Eckert, Matti Carlean Jews ([id number]) -------------------------------------------------------------------------------- Pain Assessment Details Patient Name: [name] Date of Service: 12/27/2016 8:00 AM Medical Record Patient Account Number: [id number] Number: Treating RN: Ahmed Prima [birth year] (56 y.o. Other Clinician: Date of Birth/Sex: Female) Treating ROBSON, MICHAEL Primary Care Physician: Lavera Guise Physician/Extender: G Referring Physician: Sallee Lange in Treatment: 13 Active Problems Location of Pain Severity and Description of Pain Patient Has Paino No Site Locations With Dressing Change: No Pain Management and Medication Current Pain Management: Electronic Signature(s) Signed:  12/27/2016 4:44:28 PM By: Alric Quan Entered By: Alric Quan on 12/27/2016 08:14:21 Brecht, [name] ([id number]) -------------------------------------------------------------------------------- Patient/Caregiver Education Details Patient Name: [name] Date of Service: 12/27/2016 8:00 AM Medical Record Patient Account Number: [id number] Number: Treating RN: Ahmed Prima September 20, 1961 (56 y.o. Other Clinician: Date of Birth/Gender: Female) Treating ROBSON, MICHAEL Primary Care Physician: Lavera Guise Physician/Extender: G Referring Physician: Sallee Lange in Treatment: 13 Education Assessment Education Provided To: Patient Education Topics Provided Wound/Skin Impairment: Handouts: Other: change dressing as ordered Methods: Demonstration, Explain/Verbal Responses: State content correctly Electronic Signature(s) Signed: 12/27/2016 4:44:28 PM By: Alric Quan Entered By: Alric Quan on 12/27/2016 08:25:52 Rovner, Doria Carlean Jews ([id number]) -------------------------------------------------------------------------------- Wound Assessment Details Patient Name: [name] Date of Service: 12/27/2016 8:00 AM Medical Record Patient Account Number: [id number] Number: Treating RN: Ahmed Prima [birth year] (56 y.o. Other Clinician: Date of Birth/Sex: Female) Treating ROBSON, [place] Primary Care Physician: Lavera Guise Physician/Extender: G Referring Physician: Sallee Lange in Treatment: 13 Wound Status Wound Number: 3 Primary Pressure Ulcer Etiology: Wound Location: Right Gluteal fold Wound Status: Open Wounding Event: Pressure Injury Comorbid Asthma, Hypertension, Type II Date Acquired: 04/21/2016 History: Diabetes, Neuropathy Weeks Of Treatment: 13 Clustered Wound: No Photos Photo Uploaded By: Alric Quan on 12/27/2016 11:12:56 Wound Measurements Length: (cm) 0.9 Width: (cm) 0.6 Depth: (cm) 6.5 Area: (cm)  0.424 Volume: (cm) 2.757 % Reduction in Area: 86.2% % Reduction in Volume: 84% Epithelialization: None Undermining: No Wound Description Classification: Category/Stage IV Wound Margin: Distinct, outline attached Exudate Amount: Large Exudate Type: Serous Exudate Color: amber Foul Odor After Cleansing: Yes Due to Product Use: No Wound Bed Granulation Amount: Medium (34-66%) Exposed Structure Necrotic Amount: Medium (34-66%) Fat Layer Exposed: Yes Solan, Casmira L. ([id number]) Periwound Skin Texture Texture Color No Abnormalities Noted: No No Abnormalities Noted: No Callus: No  Atrophie Blanche: No Crepitus: No Cyanosis: No Excoriation: No Ecchymosis: No Fluctuance: No Erythema: No Friable: No Hemosiderin Staining: No Induration: No Mottled: No Localized Edema: No Pallor: No Rash: No Rubor: No Scarring: Yes Temperature / Pain Moisture Temperature: No Abnormality No Abnormalities Noted: No Tenderness on Palpation: Yes Dry / Scaly: No Maceration: Yes Moist: Yes Wound Preparation Ulcer Cleansing: Rinsed/Irrigated with Saline Topical Anesthetic Applied: Other: lidocaine 4%, Treatment Notes Wound #3 (Right Gluteal fold) 1. Cleansed with: Clean wound with Normal Saline Cleanse wound with antibacterial soap and water 2. Anesthetic Topical Lidocaine 4% cream to wound bed prior to debridement 3. Peri-wound Care: Skin Prep 4. Dressing Applied: Aquacel Ag 5. Secondary Dressing Applied Dry Gauze [place] Notes Aquacel Ag rope Electronic Signature(s) Signed: 12/27/2016 4:44:28 PM By: Alric Quan Entered By: Alric Quan on 12/27/2016 08:29:58 Bradwell, [name] ([id number]) -------------------------------------------------------------------------------- Vitals Details Patient Name: [name] Date of Service: 12/27/2016 8:00 AM Medical Record Patient Account Number: [id number] Number: Treating RN: Ahmed Prima 02/22/1961 (56 y.o. Other  Clinician: Date of Birth/Sex: Female) Treating ROBSON, MICHAEL Primary Care Physician: Lavera Guise Physician/Extender: G Referring Physician: Sallee Lange in Treatment: 13 Vital Signs Time Taken: 08:05 Temperature (F): 98.2 Height (in): 63 Pulse (bpm): 74 Weight (lbs): 257 Respiratory Rate (breaths/min): 18 Body Mass Index (BMI): 45.5 Blood Pressure (mmHg): 155/67 Reference Range: 80 - 120 mg / dl Electronic Signature(s) Signed: 12/27/2016 4:44:28 PM By: Alric Quan Entered By: Alric Quan on 12/27/2016 08:14:46

## 2016-12-28 NOTE — Progress Notes (Signed)
[name], [name] Visit Report for 12/27/2016 Chief Complaint Document Details Patient Name: [name], [name] Date of Service: 12/27/2016 8:00 AM Medical Record Patient Account Number: [id number] Number: Treating RN: [name] August 07, 1961 (55 y.o. Other Clinician: Date of Birth/Sex: Female) Treating [name] Primary Care Physician/Extender: [name] Physician: Referring Physician: [name] in Treatment: 13 Information Obtained from: Patient Chief Complaint [name]. Hill presents today for routine managament of bilateral ischial pressure ulcers and sinus tract to left popliteal fossa. Electronic Signature(s) Signed: 12/27/2016 4:57:22 PM By: Katelyn Najjar MD Entered By: [name] on 12/27/2016 09:10:50 Bittel, [name] ([id number]) -------------------------------------------------------------------------------- Debridement Details Patient Name: [name] Date of Service: 12/27/2016 8:00 AM Medical Record Patient Account Number: [id number] Number: Treating RN: [name] 12/12/1961 (55 y.o. Other Clinician: Date of Birth/Sex: Female) Treating [name] Primary Care Physician/Extender: [name] Physician: Referring Physician: [name] in Treatment: 13 Debridement Performed for Wound #3 Right Gluteal fold Assessment: Performed By: Physician Katelyn Caul, MD Debridement: Debridement Pre-procedure Yes - 08:27 Verification/Time Out Taken: Start Time: 08:28 Pain Control: Lidocaine 4% Topical Solution Level: Skin/Subcutaneous Tissue Total Area Debrided (L x 0.9 (cm) x 0.6 (cm) = 0.54 (cm) W): Tissue and other Viable, Non-Viable, Exudate, Fibrin/Slough, Subcutaneous material debrided: Instrument: Other : scoop Bleeding: Minimum Hemostasis Achieved: Pressure End Time: 08:32 Procedural Pain: 0 Post Procedural Pain: 0 Response to Treatment: Procedure was tolerated well Post Debridement  Measurements of Total Wound Length: (cm) 0.9 Stage: Category/Stage IV Width: (cm) 0.6 Depth: (cm) 6.5 Volume: (cm) 2.757 Character of Wound/Ulcer Post Requires Further Debridement: Debridement Severity of Tissue Post Fat layer exposed Debridement: Post Procedure Diagnosis Same as Pre-procedure Electronic Signature(s) [name] ([id number]) Signed: 12/27/2016 4:44:28 PM By: [name] Signed: 12/27/2016 4:57:22 PM By: Katelyn Najjar MD Entered By: [name] on 12/27/2016 09:10:18 [name] ([id number]) -------------------------------------------------------------------------------- HPI Details Patient Name: [name] Date of Service: 12/27/2016 8:00 AM Medical Record Patient Account Number: [id number] Number: Treating RN: [name] September 27, 1961 (55 y.o. Other Clinician: Date of Birth/Sex: Female) Treating [name] Primary Care Physician/Extender: [name] Physician: Referring Physician: [name] in Treatment: 13 History of Present Illness Location: bilateral gluteal ulcerations and left popliteal ulceration with tunneling Quality: adits to intermittent aching to ischial ulcers, no discomfort to sinus tract Severity: right iscial ulcer with increased depth Duration: chronic ulcers to bilateral ischial ulcers and sinus tract Timing: The pain is intermittent in severity as far as how intense it becomes but is present all the time. Manipulationn makes this worse. Context: The wound occurred when the patient had a fall and was unconscious for about 48 hours laying on the floor and she had pressure injury at that stage. Modifying Factors: Other treatment(s) tried include:as noted below she has been seen by visiting wound care physicians or nurse practitioners and details have been noted Associated Signs and Symptoms: has yet to receive offloading cushions and mattress overlay HPI Description: 56 year old patient who was  seen by visiting [name] wound care specialist for a wound on both her buttock and was found to have an unstageable wound on the right buttock for about 2 months. I understand that she had a fall and was laying on the floor for about 48 hours before she was found and taken to the ICU and had a long injury to her gluteal area from pressure and also had broken her right humerus. She has had a right proximal humerus  fracture and has been followed up with orthopedics recently. The patient has a past medical history of type 2 diabetes mellitus, paraparesis, acute pyelonephritis, GERD, hypertension, glaucoma, chronic pain, anxiety neurosis, nicotine dependence, COPD. the patient had some debridement done and was to operative was recommended to use Silvadene dressing and offloading. She is a smoker and occasionally smokes a few cigarettes. the patient requested a second opinion for months and is here to discuss her care. 09/21/16; the patient re-presents from home today for review of 3 different wounds. I note that she was seen in the clinic here in July at which time she had bilateral buttock wounds. It was apparently suggested at that time that she use a wound VAC bridged to both wounds just near the initial tuberosity's bilaterally which she refused. The history was a bit difficult to put together. Apparently this patient became ill at the end of April of this year. She was found sitting on the floor she had apparently been for 2 days and subsequently admitted to hospital from 04/23/16 through 05/02/16 and at that point she was critically ill ultimately having sepsis secondary to UTI, nontraumatic rhabdomyolysis and diabetic ketoacidosis. She had acute renal failure and I think required ICU care including intubation. Patient states her wounds actually started at that point on the bilateral issue tuberosities however in reviewing the discharge summary from 5/8 I see no reference to wounds at that point. It  did state that she had left lower extremity cellulitis however. Reviewing Epic I see no relevant x-rays. It would appear that her discharge creatinine was within the normal range and indeed on [name], [name] ([id number]) 9/15 her creatinine has remained normal. [facility] There the wounds on her bilateral Botox were dressed. Only just before her discharge from the nursing facility she developed an "knot" which was interpreted as cellulitis on the posterior aspect of her left knee she was given antibiotics. Apparently sometime late in July a this actually opened and became a wound at home health care was tending to however she is still having purulent drainage coming from this and by my understanding the wound depth is actually become unmeasurable. I am not really clear about what home health has been placing in any of these wound areas. The patient states that is something with silver and it. She is not been systemically unwell no fever or chills her appetite is good. She is a diabetic poorly controlled however she states that her recent blood sugars at home have been in the low to mid 100s. 09/28/16 On evaluation today patient appears to continue to exhibit the 3 areas of ulceration that were noted previous. She did have an x-ray of the right pelvis which showed evidence of potential soft tissue infection but no obvious osteomyelitis. There was a discussion last office visit concerning the possibility of a wound VAC. Witth that being said the x-ray report suggested that an MRI may be more appropriate to further evaluate the extent. Subsequently in regard to the wound over the popliteal portion of the left lower extremity with tunneling at 12:00 the CT scan that was ordered was denied by insurance as they state the patient has not had x-rays prior to advanced imaging. Patient states that she is frustrated with the  situation overall. 10/05/16 in the interval since I last saw this patient last week she has had the x-ray of the knee performed. I did review that x-ray today and fortunately  shows no evidence of osteomyelitis or other acute abnormality at this point in time. She continues to have the opening iin the posterior oral popliteal space with tracking proximal up the posterior thigh. Nothing seems to have worsened but it also seems to have not improved. The same is true in regard to the right pressure ulcer over the gluteal region which extends toward the ischium. The left gluteal pressure ulcer actually appears to be doing somewhat better my opinion there is some necrotic slough but overall this appears fairly well. She tells me thatt she has some discomfort especially when home health is helping her with dressing changes as they do not know her. At worse she rates her pain to be a 5 out of 10 right now it's more like a 1 out of 10. 10/07/16; still the patient has 3 different wound areas. She has a deep stage IV wound over her right ischial tuberosity. She is due to have an MRI next week. The wound over her left ischial tuberosity is more superficial and underwent debridement today. Finally she has a small open area in her left popliteal fossa the probes on measurably forward superiorly. Still a lot of drainage coming out of this. The CT scan that I ordered 3 weeks ago was questioned by her insurance company wanting a plain x-ray first. As I understand things result of this is nothing has been done in 3 weeks in terms of imaging the thigh and she has an MRI booked of this along with her pelvis for next week line 10/12/16; patient has a deep probing wound over the right ischiall tuberosity, stage III wound over the left visual tuberosity and a draining sinus in her left popliteal fossa. None of this much different from when I saw this 3 weeks ago. We have been using silver rope to the right ischial  wound and a draining area in the left popliteal fossa. Plain silver alginate to the area on the left ischial tuberosity 10/19/16; the patient's wounds are essentially unchanged although the area on the left lower gluteal is actually improved. Our intake nurse noted drainage from the right initial tuberosity probing wound as well as the draining area in the left popliteal fossa. Both of these were cultured. She had x-rays I think at the insistence of her insurance company on 09/23/16 x-ray of the pelvis was not particularly helpful she did have soft tissue air over the right lower pelvis although with the depth of this wound this is not surprising. An x-ray of her left knee did not show any specific abnormalities. We are still using silver alginate to these wound areas. Her MRI is booked for 10/27 10/26/16; cultures of the purulent drainage in her right initial tuberosity wound grew moderate Proteus and few staph aureus. The same organisms were cultured out of the left knee sinus tract posteriorly. The staph aureus is MRSA. I had started her on Augmentin last week I added doxycycline. The MRI of the left lower [name], [name] ([id number]) extremity and pelvis was finally done. The MRI of the femur showed subcutaneous soft tissue swelling edema fluid and myositis in the vastus lateralis muscle but no soft tissue abscess septic arthritis or osteomyelitis. MRI of the pelvis showed the left wound to be more expensive extending down to the bone there was osteomyelitis. Left hamstring tendons were also involved. No septic arthritis involving the hip. The decubitus ulcer on the right side showed no definite osteomyelitis or abscess.. The right hip wound is  actually the one the probes 6 cm downward. But the MRI showing infection including osteomyelitis on the left explains the draining sinus in the popliteal fossa on the left. She did have antibiotics in the hospitalization last time and this extended into  her nursing home stay but I'm not exactly sure what antibiotics and for what duration. According the patient this did include vancomycin with considerable effort of our staff we are able to get the patient into see Dr. Sampson Goon today. There were transportation difficulties. Her mother had open heart surgery and is in the ICU in [place] therefore her brother was unable to transport. Dr. Jarrett Ables office graciously arranged time to see her today. From my point of view she is going to require IV vancomycin plus perhaps a third generation cephalosporin. I plan to keep her on doxycycline and Augmentin until the IV antibiotics can be arranged. 11/02/2016 - [name] presents today for management of ulcers; She saw Dr. 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 She is to have the PICC line placed today and receive the initial dose for both antibiotics today. She has yet to receive the offloading chair cushion and/or mattress overlay from home health, apparently this has been a 3 week process. I encouraged her to speak to home health regarding this matter, along with offering home health to contact the wouns care center with any questions or concerns. The left ischial pressure ulcer continues to imporve, while to right ischial ulcer has increased in depth. The popliteal fossa sinus tract remains unmeasurable due to the limitation of depth measurement (tract extends beyond our measuring devices). 11-16-2016 [name]. Tuohey presents today for evaluation and management of bilateral ischial stage IV pressure ulcers and sinus tract to the left popliteal fossa. she is under the care of Dr. Sampson Goon for IV antibiotic therapy; she states that the vancomycin was placed on hold and will be restarted at a lower dose based on her renal function. She continues taking Zosyn in addition to the vancomycin. She also states that  she has yet to receive offloading cushions from home health, according to her she does not qualify for these offloading cushions because "the ulcers are unstageable ". We will contact the home health agency today to lend clarity regarding her pressure ulcers. The left popliteal fossa sinus tract continues to be a measurable as it extends beyond the length of our measuring devices.. 11/30/16; the patient is still on vancomycin and Zosyn. The depth of the draining sinus behind her left popliteal fossa is down to 4 cm although there is still serosanguineous drainage coming out of this. She saw Dr. Sampson Goon of infectious disease yesterday the idea is to weeks more of IV antibiotics and then oral antibiotics although I have not read his note. The area on the left gluteal fold is just about healed. She has a 6 cm draining sinus over the right initial tuberosity although I cannot feel bone at the base of this. As far as the patient is aware she has not had a recheck of her inflammatory markers. 12/07/16; patient is on vancomycin and Zosyn appointment with Dr. Sampson Goon on the 19th at which point the patient expects to have a change in antibiotics. Remarkable improvement over the wound over the left ischial tuberosity which is just about closed. The draining sinus in her popliteal fossa has 0.4 cm in depth. The area on the right ischial tuberosity still probes down 7 cm.  This is closed and overall wound dimensions but not depth. 12/14/16; patient is completing her vancomycin and Zosyn and per her she is going to transition to Bactrim and Augmentin for another 3 weeks. The area on her left gluteal fold is closed except for some skin tears. The area behind her left knee is no longer has any depth. The only remaining area that is of clinical concern is on the right gluteal fold probing towards the right ischial tuberosity. Today this measures 6.9 cm in depth. Very gritty surface 12/21/16; patient is now  on Bactrim and Augmentin as directed by infectious disease. This should be for Schoeneck, Celia L. ([id number]) another 2 weeks. The area in her left gluteal fold and left popliteal are closed over and fully healed. Measurements today at 7 cm in the right buttock wound is unchanged from last week. 12/27/16; patient is on Bactrim and Augmentin for another week as directed by infectious disease. She has completed her IV antibiotics. She is not been systemically unwell no fever no chills. The area on her right buttock measured over 6 cm in depth. There is no palpable bone. No evidence of surrounding soft tissue infection. She is complaining of tongue irritation and has a history of thrush Electronic Signature(s) Signed: 12/27/2016 4:57:22 PM By: Katelyn Najjar MD Entered By: [name] on 12/27/2016 09:12:11 [place], [name] -------------------------------------------------------------------------------- Physical Exam Details Patient Name: [name] Date of Service: 12/27/2016 8:00 AM Medical Record Patient Account Number: [id number] Number: Treating RN: [name] 05-17-1961 (55 y.o. Other Clinician: Date of Birth/Sex: Female) Treating Tramar Brueckner Primary Care Physician/Extender: [name] Physician: Referring Physician: [name] in Treatment: 13 Constitutional Sitting or standing Blood Pressure is within target range for patient.. Pulse regular and within target range for patient.Marland Kitchen Respirations regular, non-labored and within target range.. Temperature is normal and within the target range for the patient.. Patient's appearance is neat and clean. Appears in no acute distress. Well nourished and well developed.. Ears, Nose, Mouth, and Throat Oropharynx within normal limits, without erythema, exudate or ulceration.. Notes Wound exam; the left gluteal wound and the draining sinus in the left popliteal fossa both remaining closed. The only  remaining area is in the right ischial tuberosity area. Deep probing wound over 6 cm. Using an open curet I attempted to debridement the area. There is a lot of gritty slough even though they cannot be visualized because of the small orifice of the wound. There is no evidence of surrounding soft tissue infection Electronic Signature(s) Signed: 12/27/2016 4:57:22 PM By: Katelyn Najjar MD Entered By: [name] on 12/27/2016 09:14:01 Reasor, [name] ([id number]) -------------------------------------------------------------------------------- Physician Orders Details Patient Name: [name] Date of Service: 12/27/2016 8:00 AM Medical Record Patient Account Number: [id number] Number: Treating RN: [name] 1960-12-29 (55 y.o. Other Clinician: Date of Birth/Sex: Female) Treating Darlyne Schmiesing Primary Care Physician/Extender: [name] Physician: Referring Physician: [name] in Treatment: 66 Verbal / Phone Orders: Yes Clinician: Ashok [name], [name] Read Back and Verified: Yes Diagnosis Coding Wound Cleansing Wound #3 Right Gluteal fold o Clean wound with Normal Saline. o Cleanse wound with mild soap and water Anesthetic Wound #3 Right Gluteal fold o Topical Lidocaine 4% cream applied to wound bed prior to debridement - clinic use only Skin Barriers/Peri-Wound Care Wound #3 Right Gluteal fold o Skin Prep Primary Wound Dressing Wound #3 Right Gluteal fold o Aquacel Ag - or equivalent to Silver Alginate Rope pack lightly into wound  and leave a tail where it can be pulled out and to cover the outside of the wound. Secondary Dressing Wound #3 Right Gluteal fold o Dry Gauze o Non-adherent pad - Telfa island, tape, or BFD Dressing Change Frequency Wound #3 Right Gluteal fold o Change dressing every other day. Follow-up Appointments Wound #3 Right Gluteal fold o Return Appointment in 1 week. Off-Loading Crain, Danee L.  ([id number]) Wound #3 Right Gluteal fold o Roho cushion for wheelchair - *********[facility] to order*********** Home Health Wound #3 Right Gluteal fold o Continue Home Health Visits - [facility] *****Please order ROHO cushion****** o Home Health Nurse may visit PRN to address patientos wound care needs. o FACE TO FACE ENCOUNTER: MEDICARE and MEDICAID PATIENTS: I certify that this patient is under my care and that I had a face-to-face encounter that meets the physician face-to-face encounter requirements with this patient on this date. The encounter with the patient was in whole or in part for the following MEDICAL CONDITION: (primary reason for Home Healthcare) MEDICAL NECESSITY: I certify, that based on my findings, NURSING services are a medically necessary home health service. HOME BOUND STATUS: I certify that my clinical findings support that this patient is homebound (i.e., Due to illness or injury, pt requires aid of supportive devices such as crutches, cane, wheelchairs, walkers, the use of special transportation or the assistance of another person to leave their place of residence. There is a normal inability to leave the home and doing so requires considerable and taxing effort. Other absences are for medical reasons / religious services and are infrequent or of short duration when for other reasons). o If current dressing causes regression in wound condition, may D/C ordered dressing product/s and apply Normal Saline Moist Dressing daily until next Wound Healing Center / Other MD appointment. Notify Wound Healing Center of regression in wound condition at [phone]. o Please direct any NON-WOUND related issues/requests for orders to patient's Primary Care Physician Negative Pressure Wound Therapy Wound #3 Right Gluteal fold o Wound VAC settings at 125/130 mmHg continuous pressure. Use BLACK/GREEN foam to wound cavity. Use WHITE foam to fill  any tunnel/s and/or undermining. Change VAC dressing 3 X WEEK. Change canister as indicated when full. Nurse may titrate settings and frequency of dressing changes as clinically indicated. - Place white foam into wound in long strip and place where you can pull the white foam out, then place small piece black foam on top. Please drape wound as according. Start now please if possible. o Home Health Nurse may d/c VAC for s/s of increased infection, significant wound regression, or uncontrolled drainage. Notify Wound Healing Center at [phone]. o Number of foam/gauze pieces used in the dressing = Medications-please add to medication list. Wound #3 Right Gluteal fold o P.O. Antibiotics - Bactrim and Augmentin o Other: - Vitamin C, Zinc, Multivitamins IV antibiotics Patient Medications Allergies: Biaxin Notifications Medication Indication Start End Turnbo, Kamyrah L. ([id number]) Notifications Medication Indication Start End nystatin 12/27/2016 DOSE 5ml - oral 100,000 unit/mL suspension - 5ml suspension oral qid swish and swallow Electronic Signature(s) Signed: 12/27/2016 9:19:29 AM By: Katelyn Najjar MD Entered By: [name] on 12/27/2016 09:19:29 [place]ger, [name] ([id number]) -------------------------------------------------------------------------------- Problem List Details Patient Name: [name] Date of Service: 12/27/2016 8:00 AM Medical Record Patient Account Number: [id number] Number: Treating RN: [name] 06-16-1961 (55 y.o. 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 09/21/2016 09/21/2016 severity Resolved Problems ICD-10 Code Description Active Date Resolved Date L89.323 Pressure ulcer of left buttock, stage 3 09/21/2016 09/21/2016 Electronic Signature(s) Signed: 12/27/2016 4:57:22 PM By: Katelyn Najjar MD Hoopingarner, [name] Entered By: [name] on 12/27/2016 09:09:53 [place], [name] ([id number]) -------------------------------------------------------------------------------- Progress Note Details Patient Name: [name] Date of Service: 12/27/2016 8:00 AM Medical Record Patient Account Number: [id number] Number: Treating RN: [name] 10-29-1961 (55 y.o. Other Clinician: Date of Birth/Sex: Female) Treating Gwenda Heiner Primary Care Physician/Extender: [name] Physician: Referring Physician: [name] in Treatment: 13 Subjective Chief Complaint Information obtained from Patient [name]. Balint presents today for routine managament of bilateral ischial pressure ulcers and sinus tract to left popliteal fossa. History of Present Illness (HPI) The following HPI elements were documented for the patient's wound: Location: bilateral gluteal ulcerations and left popliteal ulceration with tunneling Quality: adits to intermittent aching to ischial ulcers, no discomfort to sinus tract Severity: right iscial ulcer with increased depth Duration: chronic ulcers to bilateral ischial ulcers and sinus tract Timing: The pain is intermittent in severity as far as how intense it becomes but is present all the time. Manipulationn makes this worse. Context: The wound occurred when the patient had a fall and was unconscious for about 48 hours laying on the floor and she had  pressure injury at that stage. Modifying Factors: Other treatment(s) tried include:as noted below she has been seen by visiting wound care physicians or nurse practitioners and details have been noted Associated Signs and Symptoms: has yet to receive offloading cushions and mattress overlay 56 year old patient who was seen by visiting [name] wound care specialist for a wound on both her buttock and was found to have an unstageable wound on the right buttock for about 2 months. I understand that she had a fall and was laying on the floor for about 48 hours before she was found and taken to the ICU and had a long injury to her gluteal area from pressure and also had broken her right humerus. She has had a right proximal humerus fracture and has been followed up with orthopedics recently. The patient has a past medical history of type 2 diabetes mellitus, paraparesis, acute pyelonephritis, GERD, hypertension, glaucoma, chronic pain, anxiety neurosis, nicotine dependence, COPD. the patient had some debridement done and was to operative was recommended to use Silvadene dressing and offloading. She is a smoker and occasionally smokes a few cigarettes. the patient requested a second opinion for months and is here to discuss her care. 09/21/16; the patient re-presents from home today for review of 3 different wounds. I note that she was seen in the clinic here in July at which time she had bilateral buttock wounds. It was apparently suggested at that time that she use a wound VAC bridged to both wounds just near the initial tuberosity's bilaterally which she refused. Ege, Wetona L. ([id number]) The history was a bit difficult to put together. Apparently this patient became ill at the end of April of this year. She was found sitting on the floor she had  apparently been for 2 days and subsequently admitted to hospital from 04/23/16 through 05/02/16 and at that point she was critically ill ultimately having  sepsis secondary to UTI, nontraumatic rhabdomyolysis and diabetic ketoacidosis. She had acute renal failure and I think required ICU care including intubation. Patient states her wounds actually started at that point on the bilateral issue tuberosities however in reviewing the discharge summary from 5/8 I see no reference to wounds at that point. It did state that she had left lower extremity cellulitis however. Reviewing Epic I see no relevant x-rays. It would appear that her discharge creatinine was within the normal range and indeed on 9/15 her creatinine has remained normal. [facility] There the wounds on her bilateral Botox were dressed. Only just before her discharge from the nursing facility she developed an "knot" which was interpreted as cellulitis on the posterior aspect of her left knee she was given antibiotics. Apparently sometime late in July a this actually opened and became a wound at home health care was tending to however she is still having purulent drainage coming from this and by my understanding the wound depth is actually become unmeasurable. I am not really clear about what home health has been placing in any of these wound areas. The patient states that is something with silver and it. She is not been systemically unwell no fever or chills her appetite is good. She is a diabetic poorly controlled however she states that her recent blood sugars at home have been in the low to mid 100s. 09/28/16 On evaluation today patient appears to continue to exhibit the 3 areas of ulceration that were noted previous. She did have an x-ray of the right pelvis which showed evidence of potential soft tissue infection but no obvious osteomyelitis. There was a discussion last office visit concerning the possibility of a wound VAC. Witth that being said the x-ray report suggested that an MRI may be more appropriate to  further evaluate the extent. Subsequently in regard to the wound over the popliteal portion of the left lower extremity with tunneling at 12:00 the CT scan that was ordered was denied by insurance as they state the patient has not had x-rays prior to advanced imaging. Patient states that she is frustrated with the situation overall. 10/05/16 in the interval since I last saw this patient last week she has had the x-ray of the knee performed. I did review that x-ray today and fortunately shows no evidence of osteomyelitis or other acute abnormality at this point in time. She continues to have the opening iin the posterior oral popliteal space with tracking proximal up the posterior thigh. Nothing seems to have worsened but it also seems to have not improved. The same is true in regard to the right pressure ulcer over the gluteal region which extends toward the ischium. The left gluteal pressure ulcer actually appears to be doing somewhat better my opinion there is some necrotic slough but overall this appears fairly well. She tells me thatt she has some discomfort especially when home health is helping her with dressing changes as they do not know her. At worse she rates her pain to be a 5 out of 10 right now it's more like a 1 out of 10. 10/07/16; still the patient has 3 different wound areas. She has a deep stage IV wound over her right ischial tuberosity. She is due to have an MRI next week. The wound over  her left ischial tuberosity is more superficial and underwent debridement today. Finally she has a small open area in her left popliteal fossa the probes on measurably forward superiorly. Still a lot of drainage coming out of this. The CT scan that I ordered 3 weeks ago was questioned by her insurance company wanting a plain x-ray first. As I understand things result of this is nothing has been done in 3 weeks in terms of imaging the thigh and she has an MRI booked of this along with her  pelvis for next week line 10/12/16; patient has a deep probing wound over the right ischiall tuberosity, stage III wound over the left visual tuberosity and a draining sinus in her left popliteal fossa. None of this much different from when I saw this 3 weeks ago. We have been using silver rope to the right ischial wound and a draining area in the left popliteal fossa. Plain silver alginate to the area on the left ischial tuberosity 10/19/16; the patient's wounds are essentially unchanged although the area on the left lower gluteal is [name], [name] ([id number]) actually improved. Our intake nurse noted drainage from the right initial tuberosity probing wound as well as the draining area in the left popliteal fossa. Both of these were cultured. She had x-rays I think at the insistence of her insurance company on 09/23/16 x-ray of the pelvis was not particularly helpful she did have soft tissue air over the right lower pelvis although with the depth of this wound this is not surprising. An x-ray of her left knee did not show any specific abnormalities. We are still using silver alginate to these wound areas. Her MRI is booked for 10/27 10/26/16; cultures of the purulent drainage in her right initial tuberosity wound grew moderate Proteus and few staph aureus. The same organisms were cultured out of the left knee sinus tract posteriorly. The staph aureus is MRSA. I had started her on Augmentin last week I added doxycycline. The MRI of the left lower extremity and pelvis was finally done. The MRI of the femur showed subcutaneous soft tissue swelling edema fluid and myositis in the vastus lateralis muscle but no soft tissue abscess septic arthritis or osteomyelitis. MRI of the pelvis showed the left wound to be more expensive extending down to the bone there was osteomyelitis. Left hamstring tendons were also involved. No septic arthritis involving the hip. The decubitus ulcer on the right side showed  no definite osteomyelitis or abscess.. The right hip wound is actually the one the probes 6 cm downward. But the MRI showing infection including osteomyelitis on the left explains the draining sinus in the popliteal fossa on the left. She did have antibiotics in the hospitalization last time and this extended into her nursing home stay but I'm not exactly sure what antibiotics and for what duration. According the patient this did include vancomycin with considerable effort of our staff we are able to get the patient into see Dr. Sampson Goon today. There were transportation difficulties. Her mother had open heart surgery and is in the ICU in [place] therefore her brother was unable to transport. Dr. Jarrett Ables office graciously arranged time to see her today. From my point of view she is going to require IV vancomycin plus perhaps a third generation cephalosporin. I plan to keep her on doxycycline and Augmentin until the IV antibiotics can be arranged. 11/02/2016 - Coral presents today for management of ulcers; She saw Dr. Sampson Goon (infectious disease) last week who prescribed Zosyn  and Vancomycin for MRI confirmation of osteomyelits to the left ischiium. She is to have the PICC line placed today and receive the initial dose for both antibiotics today. She has yet to receive the offloading chair cushion and/or mattress overlay from home health, apparently this has been a 3 week process. I encouraged her to speak to home health regarding this matter, along with offering home health to contact the wouns care center with any questions or concerns. The left ischial pressure ulcer continues to imporve, while to right ischial ulcer has increased in depth. The popliteal fossa sinus tract remains unmeasurable due to the limitation of depth measurement (tract extends beyond our measuring devices). 11-16-2016 [name]. Geffrard presents today for evaluation and management of bilateral ischial stage IV  pressure ulcers and sinus tract to the left popliteal fossa. she is under the care of Dr. Sampson Goon for IV antibiotic therapy; she states that the vancomycin was placed on hold and will be restarted at a lower dose based on her renal function. She continues taking Zosyn in addition to the vancomycin. She also states that she has yet to receive offloading cushions from home health, according to her she does not qualify for these offloading cushions because "the ulcers are unstageable ". We will contact the home health agency today to lend clarity regarding her pressure ulcers. The left popliteal fossa sinus tract continues to be a measurable as it extends beyond the length of our measuring devices.. 11/30/16; the patient is still on vancomycin and Zosyn. The depth of the draining sinus behind her left popliteal fossa is down to 4 cm although there is still serosanguineous drainage coming out of this. She saw Dr. Sampson Goon of infectious disease yesterday the idea is to weeks more of IV antibiotics and then oral antibiotics although I have not read his note. The area on the left gluteal fold is just about healed. She has a 6 cm draining sinus over the right initial tuberosity although I cannot feel bone at the base of this. As far as the patient is aware she has not had a recheck of her inflammatory markers. 12/07/16; patient is on vancomycin and Zosyn appointment with Dr. Sampson Goon on the 19th at which point the patient expects to have a change in antibiotics. Remarkable improvement over the wound over the left [name], Anneta L. ([id number]) ischial tuberosity which is just about closed. The draining sinus in her popliteal fossa has 0.4 cm in depth. The area on the right ischial tuberosity still probes down 7 cm. This is closed and overall wound dimensions but not depth. 12/14/16; patient is completing her vancomycin and Zosyn and per her she is going to transition to Bactrim and Augmentin for  another 3 weeks. The area on her left gluteal fold is closed except for some skin tears. The area behind her left knee is no longer has any depth. The only remaining area that is of clinical concern is on the right gluteal fold probing towards the right ischial tuberosity. Today this measures 6.9 cm in depth. Very gritty surface 12/21/16; patient is now on Bactrim and Augmentin as directed by infectious disease. This should be for another 2 weeks. The area in her left gluteal fold and left popliteal are closed over and fully healed. Measurements today at 7 cm in the right buttock wound is unchanged from last week. 12/27/16; patient is on Bactrim and Augmentin for another week as directed by infectious disease. She has completed her IV antibiotics. She is not  been systemically unwell no fever no chills. The area on her right buttock measured over 6 cm in depth. There is no palpable bone. No evidence of surrounding soft tissue infection. She is complaining of tongue irritation and has a history of thrush Objective Constitutional Sitting or standing Blood Pressure is within target range for patient.. Pulse regular and within target range for patient.Marland Kitchen Respirations regular, non-labored and within target range.. Temperature is normal and within the target range for the patient.. Patient's appearance is neat and clean. Appears in no acute distress. Well nourished and well developed.. Vitals Time Taken: 8:05 AM, Height: 63 in, Weight: 257 lbs, BMI: 45.5, Temperature: 98.2 F, Pulse: 74 bpm, Respiratory Rate: 18 breaths/min, Blood Pressure: 155/67 mmHg. Ears, Nose, Mouth, and Throat Oropharynx within normal limits, without erythema, exudate or ulceration.. General Notes: Wound exam; the left gluteal wound and the draining sinus in the left popliteal fossa both remaining closed. The only remaining area is in the right ischial tuberosity area. Deep probing wound over 6 cm. Using an open curet I attempted  to debridement the area. There is a lot of gritty slough even though they cannot be visualized because of the small orifice of the wound. There is no evidence of surrounding soft tissue infection Integumentary (Hair, Skin) Wound #3 status is Open. Original cause of wound was Pressure Injury. The wound is located on the Right Gluteal fold. The wound measures 0.9cm length x 0.6cm width x 6.5cm depth; 0.424cm^2 area and 2.757cm^3 volume. There is fat exposed. There is no undermining noted. There is a large amount of serous drainage noted. The wound margin is distinct with the outline attached to the wound base. There is medium (34-66%) granulation within the wound bed. There is a medium (34-66%) amount of necrotic tissue within the wound bed. The periwound skin appearance exhibited: Scarring, Maceration, Moist. The periwound skin Karasik, Kree L. ([id number]) appearance did not exhibit: Callus, Crepitus, Excoriation, Fluctuance, Friable, Induration, Localized Edema, Rash, Dry/Scaly, Atrophie Blanche, Cyanosis, Ecchymosis, Hemosiderin Staining, Mottled, Pallor, Rubor, Erythema. Periwound temperature was noted as No Abnormality. The periwound has tenderness on palpation. 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 There was a Skin/Subcutaneous Tissue Debridement ([id number]) debridement with total area of 0.54 sq cm performed by Katelyn Caul, MD. with the following instrument(s): scoop to remove Viable and Non-Viable tissue/material including Exudate, Fibrin/Slough, and Subcutaneous after achieving pain control using Lidocaine 4% Topical Solution. A time out was conducted at 08:27, prior to the start of the procedure. A Minimum amount of bleeding was  controlled with Pressure. The procedure was tolerated well with a pain level of 0 throughout and a pain level of 0 following the procedure. Post Debridement Measurements: 0.9cm length x 0.6cm width x 6.5cm depth; 2.757cm^3 volume. Post debridement Stage noted as Category/Stage IV. Character of Wound/Ulcer Post Debridement requires further debridement. Severity of Tissue Post Debridement is: Fat layer exposed. Post procedure Diagnosis Wound #3: Same as Pre-Procedure Plan Wound Cleansing: Wound #3 Right Gluteal fold: Clean wound with Normal Saline. Cleanse wound with mild soap and water Anesthetic: Zweig, Betzayda L. ([id number]) Wound #3 Right Gluteal fold: Topical Lidocaine 4% cream applied to wound bed prior to debridement - clinic use only Skin Barriers/Peri-Wound Care: Wound #3 Right Gluteal fold: Skin Prep Primary Wound  Dressing: Wound #3 Right Gluteal fold: Aquacel Ag - or equivalent to Silver Alginate Rope pack lightly into wound and leave a tail where it can be pulled out and to cover the outside of the wound. Secondary Dressing: Wound #3 Right Gluteal fold: Dry Gauze Non-adherent pad - Telfa island, tape, or BFD Dressing Change Frequency: Wound #3 Right Gluteal fold: Change dressing every other day. Follow-up Appointments: Wound #3 Right Gluteal fold: Return Appointment in 1 week. Off-Loading: Wound #3 Right Gluteal fold: Roho cushion for wheelchair - *********[facility] to order*********** Home Health: Wound #3 Right Gluteal fold: Continue Home Health Visits - [facility] *****Please order [facility] cushion****** Home Health Nurse may visit PRN to address patient s wound care needs. FACE TO FACE ENCOUNTER: MEDICARE and MEDICAID PATIENTS: I certify that this patient is under my care and that I had a face-to-face encounter that meets the physician face-to-face encounter requirements with this patient on this date. The encounter with the patient was in whole or  in part for the following MEDICAL CONDITION: (primary reason for Home Healthcare) MEDICAL NECESSITY: I certify, that based on my findings, NURSING services are a medically necessary home health service. HOME BOUND STATUS: I certify that my clinical findings support that this patient is homebound (i.e., Due to illness or injury, pt requires aid of supportive devices such as crutches, cane, wheelchairs, walkers, the use of special transportation or the assistance of another person to leave their place of residence. There is a normal inability to leave the home and doing so requires considerable and taxing effort. Other absences are for medical reasons / religious services and are infrequent or of short duration when for other reasons). If current dressing causes regression in wound condition, may D/C ordered dressing product/s and apply Normal Saline Moist Dressing daily until next Wound Healing Center / Other MD appointment. Notify Wound Healing Center of regression in wound condition at [phone]. Please direct any NON-WOUND related issues/requests for orders to patient's Primary Care Physician Negative Pressure Wound Therapy: Wound #3 Right Gluteal fold: Wound VAC settings at 125/130 mmHg continuous pressure. Use BLACK/GREEN foam to wound cavity. Use WHITE foam to fill any tunnel/s and/or undermining. Change VAC dressing 3 X WEEK. Change canister as indicated when full. Nurse may titrate settings and frequency of dressing changes as clinically indicated. - Place white foam into wound in long strip and place where you can pull the white foam out, then place small piece black foam on top. Please drape wound as according. Start now please if possible. Home Health Nurse may d/c VAC for s/s of increased infection, significant wound regression, or uncontrolled drainage. Notify Wound Healing Center at [phone]. Number of foam/gauze pieces used in the dressing = Medications-please add to  medication list.: Driggers, Noreta L. ([id number]) Wound #3 Right Gluteal fold: P.O. Antibiotics - Bactrim and Augmentin Other: - Vitamin C, Zinc, Multivitamins IV antibiotics The following medication(s) was prescribed: nystatin oral 100,000 unit/mL suspension 30ml 39ml suspension oral qid swish and swallow starting 12/27/2016 wound vac white foam nystatin 32ml qid Electronic Signature(s) Signed: 12/27/2016 4:57:22 PM By: Katelyn Najjar MD Entered By: [name] on 12/27/2016 09:20:11 Mccammon, [name] ([id number]) -------------------------------------------------------------------------------- SuperBill Details Patient Name: [name] Date of Service: 12/27/2016 Medical Record Patient Account Number: [id number] Number: Treating RN: [name] 06/02/1961 (55 y.o. Other Clinician: Date of Birth/Sex: Female) Treating Delmar Arriaga Primary Care Physician/Extender: [name] Physician: Weeks in Treatment: 13 Referring Physician: Joen [name] Diagnosis Coding  ICD-10 Codes Code Description L89.314 Pressure ulcer of right buttock, stage 4 M86.18 Other acute osteomyelitis, other site E11.42 Type 2 diabetes mellitus with diabetic polyneuropathy Facility Procedures CPT4 Code: [id number] Description: 11042 - DEB SUBQ TISSUE 20 SQ CM/< ICD-10 Description Diagnosis L89.314 Pressure ulcer of right buttock, stage 4 M86.18 Other acute osteomyelitis, other site Modifier: Quantity: 1 Physician Procedures CPT4 Code: [id number] Description: 11042 - WC PHYS SUBQ TISS 20 SQ CM ICD-10 Description Diagnosis L89.314 Pressure ulcer of right buttock, stage 4 M86.18 Other acute osteomyelitis, other site Modifier: Quantity: 1 Electronic Signature(s) Signed: 12/27/2016 4:57:22 PM By: Katelyn Najjar MD Entered By: [name] on 12/27/2016 09:20:37

## 2017-01-04 ENCOUNTER — Encounter: Payer: Managed Care, Other (non HMO) | Admitting: Internal Medicine

## 2017-01-04 DIAGNOSIS — L89314 Pressure ulcer of right buttock, stage 4: Secondary | ICD-10-CM | POA: Diagnosis not present

## 2017-01-06 NOTE — Progress Notes (Signed)
[name] ([id number]) Visit Report for 01/04/2017 Chief Complaint Document Details Patient Name: [name], [name] Date of Service: 01/04/2017 8:00 AM Medical Record Patient Account Number: [id number] Number: Treating RN: Phillis Haggis [birth year] (56 y.o. Other Clinician: Date of Birth/Sex: Female) Treating Williemae Muriel Primary Care Physician/Extender: Leticia Penna Physician: Referring Physician: Tedra Senegal in Treatment: 15 Information Obtained from: Patient Chief Complaint Ms. Lashomb presents today for routine managament of bilateral ischial pressure ulcers and sinus tract to left popliteal fossa. Electronic Signature(s) Signed: 01/04/2017 6:05:53 PM By: Baltazar Najjar MD Entered By: Baltazar Najjar on 01/04/2017 08:38:19 [name] ([id number]) -------------------------------------------------------------------------------- Debridement Details Patient Name: [name] Date of Service: 01/04/2017 8:00 AM Medical Record Patient Account Number: [id number] Number: Treating RN: Phillis Haggis 13-Jan-1961 (56 y.o. Other Clinician: Date of Birth/Sex: Female) Treating Franciszek Platten Primary Care Physician/Extender: [name], [name] Physician: Referring Physician: Tedra Senegal in Treatment: 15 Debridement Performed for Wound #3 Right Gluteal fold Assessment: Performed By: Physician Maxwell Caul, MD Debridement: Debridement Pre-procedure Yes - 08:23 Verification/Time Out Taken: Start Time: 08:24 Pain Control: Lidocaine 4% Topical Solution Level: Skin/Subcutaneous Tissue Total Area Debrided (L x 1 (cm) x 0.6 (cm) = 0.6 (cm) W): Tissue and other Viable, Non-Viable, Exudate, Fibrin/Slough, Subcutaneous material debrided: Instrument: Other : scoop Bleeding: Minimum Hemostasis Achieved: Pressure End Time: 08:27 Procedural Pain: 0 Post Procedural Pain: 0 Response to Treatment: Procedure was tolerated well Post Debridement  Measurements of Total Wound Length: (cm) 1 Stage: Category/Stage IV Width: (cm) 0.6 Depth: (cm) 6.7 Volume: (cm) 3.157 Character of Wound/Ulcer Post Requires Further Debridement: Debridement Severity of Tissue Post Fat layer exposed Debridement: Post Procedure Diagnosis Same as Pre-procedure Electronic Signature(s) [name] ([id number]) Signed: 01/04/2017 6:05:53 PM By: Baltazar Najjar MD Signed: 01/05/2017 5:33:09 PM By: Alejandro Mulling Entered By: Baltazar Najjar on 01/04/2017 08:37:56 [name] ([id number]) -------------------------------------------------------------------------------- HPI Details Patient Name: [name] Date of Service: 01/04/2017 8:00 AM Medical Record Patient Account Number: [id number] Number: Treating RN: Phillis Haggis 1961-02-13 (56 y.o. Other Clinician: Date of Birth/Sex: Female) Treating Darrelle Barrell Primary Care Physician/Extender: [name], [name] Physician: Referring Physician: Tedra Senegal in Treatment: 15 History of Present Illness Location: bilateral gluteal ulcerations and left popliteal ulceration with tunneling Quality: adits to intermittent aching to ischial ulcers, no discomfort to sinus tract Severity: right iscial ulcer with increased depth Duration: chronic ulcers to bilateral ischial ulcers and sinus tract Timing: The pain is intermittent in severity as far as how intense it becomes but is present all the time. Manipulationn makes this worse. Context: The wound occurred when the patient had a fall and was unconscious for about 48 hours laying on the floor and she had pressure injury at that stage. Modifying Factors: Other treatment(s) tried include:as noted below she has been seen by visiting wound care physicians or nurse practitioners and details have been noted Associated Signs and Symptoms: has yet to receive offloading cushions and mattress overlay HPI Description: 56 year old patient who was  seen by visiting [name] wound care specialist for a wound on both her buttock and was found to have an unstageable wound on the right buttock for about 2 months. I understand that she had a fall and was laying on the floor for about 48 hours before she was found and taken to the ICU and had a long injury to her gluteal area from pressure and also had broken her right humerus. She has had a right proximal humerus  fracture and has been followed up with orthopedics recently. The patient has a past medical history of type 2 diabetes mellitus, paraparesis, acute pyelonephritis, GERD, hypertension, glaucoma, chronic pain, anxiety neurosis, nicotine dependence, COPD. the patient had some debridement done and was to operative was recommended to use Silvadene dressing and offloading. She is a smoker and occasionally smokes a few cigarettes. the patient requested a second opinion for months and is here to discuss her care. 09/21/16; the patient re-presents from home today for review of 3 different wounds. I note that she was seen in the clinic here in July at which time she had bilateral buttock wounds. It was apparently suggested at that time that she use a wound VAC bridged to both wounds just near the initial tuberosity's bilaterally which she refused. The history was a bit difficult to put together. Apparently this patient became ill at the end of April of this year. She was found sitting on the floor she had apparently been for 2 days and subsequently admitted to hospital from 04/23/16 through 05/02/16 and at that point she was critically ill ultimately having sepsis secondary to UTI, nontraumatic rhabdomyolysis and diabetic ketoacidosis. She had acute renal failure and I think required ICU care including intubation. Patient states her wounds actually started at that point on the bilateral issue tuberosities however in reviewing the discharge summary from 5/8 I see no reference to wounds at that point. It  did state that she had left lower extremity cellulitis however. Reviewing Epic I see no relevant x-rays. It would appear that her discharge creatinine was within the normal range and indeed on [name], [name] ([id number]) 9/15 her creatinine has remained normal. [facility] There the wounds on her bilateral Botox were dressed. Only just before her discharge from the nursing facility she developed an "knot" which was interpreted as cellulitis on the posterior aspect of her left knee she was given antibiotics. Apparently sometime late in July a this actually opened and became a wound at home health care was tending to however she is still having purulent drainage coming from this and by my understanding the wound depth is actually become unmeasurable. I am not really clear about what home health has been placing in any of these wound areas. The patient states that is something with silver and it. She is not been systemically unwell no fever or chills her appetite is good. She is a diabetic poorly controlled however she states that her recent blood sugars at home have been in the low to mid 100s. 09/28/16 On evaluation today patient appears to continue to exhibit the 3 areas of ulceration that were noted previous. She did have an x-ray of the right pelvis which showed evidence of potential soft tissue infection but no obvious osteomyelitis. There was a discussion last office visit concerning the possibility of a wound VAC. Witth that being said the x-ray report suggested that an MRI may be more appropriate to further evaluate the extent. Subsequently in regard to the wound over the popliteal portion of the left lower extremity with tunneling at 12:00 the CT scan that was ordered was denied by insurance as they state the patient has not had x-rays prior to advanced imaging. Patient states that she is frustrated with the  situation overall. 10/05/16 in the interval since I last saw this patient last week she has had the x-ray of the knee performed. I did review that x-ray today and fortunately  shows no evidence of osteomyelitis or other acute abnormality at this point in time. She continues to have the opening iin the posterior oral popliteal space with tracking proximal up the posterior thigh. Nothing seems to have worsened but it also seems to have not improved. The same is true in regard to the right pressure ulcer over the gluteal region which extends toward the ischium. The left gluteal pressure ulcer actually appears to be doing somewhat better my opinion there is some necrotic slough but overall this appears fairly well. She tells me thatt she has some discomfort especially when home health is helping her with dressing changes as they do not know her. At worse she rates her pain to be a 5 out of 10 right now it's more like a 1 out of 10. 10/07/16; still the patient has 3 different wound areas. She has a deep stage IV wound over her right ischial tuberosity. She is due to have an MRI next week. The wound over her left ischial tuberosity is more superficial and underwent debridement today. Finally she has a small open area in her left popliteal fossa the probes on measurably forward superiorly. Still a lot of drainage coming out of this. The CT scan that I ordered 3 weeks ago was questioned by her insurance company wanting a plain x-ray first. As I understand things result of this is nothing has been done in 3 weeks in terms of imaging the thigh and she has an MRI booked of this along with her pelvis for next week line 10/12/16; patient has a deep probing wound over the right ischiall tuberosity, stage III wound over the left visual tuberosity and a draining sinus in her left popliteal fossa. None of this much different from when I saw this 3 weeks ago. We have been using silver rope to the right ischial  wound and a draining area in the left popliteal fossa. Plain silver alginate to the area on the left ischial tuberosity 10/19/16; the patient's wounds are essentially unchanged although the area on the left lower gluteal is actually improved. Our intake nurse noted drainage from the right initial tuberosity probing wound as well as the draining area in the left popliteal fossa. Both of these were cultured. She had x-rays I think at the insistence of her insurance company on 09/23/16 x-ray of the pelvis was not particularly helpful she did have soft tissue air over the right lower pelvis although with the depth of this wound this is not surprising. An x-ray of her left knee did not show any specific abnormalities. We are still using silver alginate to these wound areas. Her MRI is booked for 10/27 10/26/16; cultures of the purulent drainage in her right initial tuberosity wound grew moderate Proteus and few staph aureus. The same organisms were cultured out of the left knee sinus tract posteriorly. The staph aureus is MRSA. I had started her on Augmentin last week I added doxycycline. The MRI of the left lower Burdi, Chizaram L. ([id number]) extremity and pelvis was finally done. The MRI of the femur showed subcutaneous soft tissue swelling edema fluid and myositis in the vastus lateralis muscle but no soft tissue abscess septic arthritis or osteomyelitis. MRI of the pelvis showed the left wound to be more expensive extending down to the bone there was osteomyelitis. Left hamstring tendons were also involved. No septic arthritis involving the hip. The decubitus ulcer on the right side showed no definite osteomyelitis or abscess.. The right hip wound is  actually the one the probes 6 cm downward. But the MRI showing infection including osteomyelitis on the left explains the draining sinus in the popliteal fossa on the left. She did have antibiotics in the hospitalization last time and this extended into  her nursing home stay but I'm not exactly sure what antibiotics and for what duration. According the patient this did include vancomycin with considerable effort of our staff we are able to get the patient into see Dr. Sampson Goon today. There were transportation difficulties. Her mother had open heart surgery and is in the ICU in [place] therefore her brother was unable to transport. Dr. Jarrett Ables office graciously arranged time to see her today. From my point of view she is going to require IV vancomycin plus perhaps a third generation cephalosporin. I plan to keep her on doxycycline and Augmentin until the IV antibiotics can be arranged. 11/02/2016 - Johnnette presents today for management of ulcers; She saw Dr. 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 She is to have the PICC line placed today and receive the initial dose for both antibiotics today. She has yet to receive the offloading chair cushion and/or mattress overlay from home health, apparently this has been a 3 week process. I encouraged her to speak to home health regarding this matter, along with offering home health to contact the wouns care center with any questions or concerns. The left ischial pressure ulcer continues to imporve, while to right ischial ulcer has increased in depth. The popliteal fossa sinus tract remains unmeasurable due to the limitation of depth measurement (tract extends beyond our measuring devices). 11-16-2016 Ms. Tuohey presents today for evaluation and management of bilateral ischial stage IV pressure ulcers and sinus tract to the left popliteal fossa. she is under the care of Dr. Sampson Goon for IV antibiotic therapy; she states that the vancomycin was placed on hold and will be restarted at a lower dose based on her renal function. She continues taking Zosyn in addition to the vancomycin. She also states that  she has yet to receive offloading cushions from home health, according to her she does not qualify for these offloading cushions because "the ulcers are unstageable ". We will contact the home health agency today to lend clarity regarding her pressure ulcers. The left popliteal fossa sinus tract continues to be a measurable as it extends beyond the length of our measuring devices.. 11/30/16; the patient is still on vancomycin and Zosyn. The depth of the draining sinus behind her left popliteal fossa is down to 4 cm although there is still serosanguineous drainage coming out of this. She saw Dr. Sampson Goon of infectious disease yesterday the idea is to weeks more of IV antibiotics and then oral antibiotics although I have not read his note. The area on the left gluteal fold is just about healed. She has a 6 cm draining sinus over the right initial tuberosity although I cannot feel bone at the base of this. As far as the patient is aware she has not had a recheck of her inflammatory markers. 12/07/16; patient is on vancomycin and Zosyn appointment with Dr. Sampson Goon on the 19th at which point the patient expects to have a change in antibiotics. Remarkable improvement over the wound over the left ischial tuberosity which is just about closed. The draining sinus in her popliteal fossa has 0.4 cm in depth. The area on the right ischial tuberosity still probes down 7 cm.  This is closed and overall wound dimensions but not depth. 12/14/16; patient is completing her vancomycin and Zosyn and per her she is going to transition to Bactrim and Augmentin for another 3 weeks. The area on her left gluteal fold is closed except for some skin tears. The area behind her left knee is no longer has any depth. The only remaining area that is of clinical concern is on the right gluteal fold probing towards the right ischial tuberosity. Today this measures 6.9 cm in depth. Very gritty surface 12/21/16; patient is now  on Bactrim and Augmentin as directed by infectious disease. This should be for Mccormack, Albirtha L. ([id number]) another 2 weeks. The area in her left gluteal fold and left popliteal are closed over and fully healed. Measurements today at 7 cm in the right buttock wound is unchanged from last week. 12/27/16; patient is on Bactrim and Augmentin for another week as directed by infectious disease. She has completed her IV antibiotics. She is not been systemically unwell no fever no chills. The area on her right buttock measured over 6 cm in depth. There is no palpable bone. No evidence of surrounding soft tissue infection. She is complaining of tongue irritation and has a history of thrush 01/04/17; patient is been back to see Dr. Sampson Goon, her Augmentin was stopped but he continued the Bactrim for another 3 weeks. Depth of the wound is 6.7 cm there is been no major change in either direction. She is not receive the wound VAC from home health I think because of confusion about who is supposed to provided will actually talk to the [facility] agency [facility] The net no major change Electronic Signature(s) Signed: 01/04/2017 6:05:53 PM By: Baltazar Najjar MD Entered By: Baltazar Najjar on 01/04/2017 08:39:50 Longoria, Thressa [name] ([id number]) -------------------------------------------------------------------------------- Physical Exam Details Patient Name: [name] Date of Service: 01/04/2017 8:00 AM Medical Record Patient Account Number: [id number] Number: Treating RN: Phillis Haggis 09/23/1961 (55 y.o. Other Clinician: Date of Birth/Sex: Female) Treating Rogenia Werntz Primary Care Physician/Extender: Leticia Penna Physician: Referring Physician: Tedra Senegal in Treatment: 15 Constitutional Sitting or standing Blood Pressure is within target range for patient.. Pulse regular and within target range for patient.Marland Kitchen Respirations regular, non-labored and within target range..  Patient's appearance is neat and clean. Appears in no acute distress. Well nourished and well developed.. Notes Wound exam the left gluteal wound is still closed. Deep tunneling wound over the area of the right ischial tuberosity is unchanged in appearance and depth. I have been using a open curet and debridement and this blindly since the orifice of the wound is generally too small to see anything. There is a gritty surface nonviable subcutaneous tissue and removed. I really hope to have the wound VAC on this week. Surrounding subcutaneous tissue looks normal there is no erythema no palpable crepitus Electronic Signature(s) Signed: 01/04/2017 6:05:53 PM By: Baltazar Najjar MD Entered By: Baltazar Najjar on 01/04/2017 08:41:35 Mack, Thressa [name] ([id number]) -------------------------------------------------------------------------------- Physician Orders Details Patient Name: [name] Date of Service: 01/04/2017 8:00 AM Medical Record Patient Account Number: [id number] Number: Treating RN: Phillis Haggis 05/15/1961 (55 y.o. Other Clinician: Date of Birth/Sex: Female) Treating Jennene Downie Primary Care Physician/Extender: [name], [name] Physician: Referring Physician: Tedra Senegal in Treatment: 15 Verbal / Phone Orders: Yes Clinician: Ashok Cordia, Debi Read Back and Verified: Yes Diagnosis Coding Wound Cleansing Wound #3 Right Gluteal fold o Clean wound with Normal Saline. o Cleanse wound with mild  soap and water Anesthetic Wound #3 Right Gluteal fold o Topical Lidocaine 4% cream applied to wound bed prior to debridement - clinic use only Skin Barriers/Peri-Wound Care Wound #3 Right Gluteal fold o Skin Prep Primary Wound Dressing Wound #3 Right Gluteal fold o Aquacel Ag - or equivalent to Silver Alginate Rope pack lightly into wound and leave a tail where it can be pulled out and to cover the outside of the wound. Secondary Dressing Wound #3  Right Gluteal fold o Dry Gauze o Non-adherent pad - Telfa island, tape, or BFD Dressing Change Frequency Wound #3 Right Gluteal fold o Change dressing every other day. Follow-up Appointments Wound #3 Right Gluteal fold o Return Appointment in 1 week. Off-Loading Kassel, Chasitee L. ([id number]) Wound #3 Right Gluteal fold o Roho cushion for wheelchair - *********[facility] to order*********** Home Health Wound #3 Right Gluteal fold o Continue Home Health Visits - [facility] *****Please order ROHO cushion****** o Home Health Nurse may visit PRN to address patientos wound care needs. o FACE TO FACE ENCOUNTER: MEDICARE and MEDICAID PATIENTS: I certify that this patient is under my care and that I had a face-to-face encounter that meets the physician face-to-face encounter requirements with this patient on this date. The encounter with the patient was in whole or in part for the following MEDICAL CONDITION: (primary reason for Home Healthcare) MEDICAL NECESSITY: I certify, that based on my findings, NURSING services are a medically necessary home health service. HOME BOUND STATUS: I certify that my clinical findings support that this patient is homebound (i.e., Due to illness or injury, pt requires aid of supportive devices such as crutches, cane, wheelchairs, walkers, the use of special transportation or the assistance of another person to leave their place of residence. There is a normal inability to leave the home and doing so requires considerable and taxing effort. Other absences are for medical reasons / religious services and are infrequent or of short duration when for other reasons). o If current dressing causes regression in wound condition, may D/C ordered dressing product/s and apply Normal Saline Moist Dressing daily until next Wound Healing Center / Other MD appointment. Notify Wound Healing Center of regression in wound condition at  [phone]. o Please direct any NON-WOUND related issues/requests for orders to patient's Primary Care Physician Negative Pressure Wound Therapy Wound #3 Right Gluteal fold o Wound VAC settings at 125/130 mmHg continuous pressure. Use BLACK/GREEN foam to wound cavity. Use WHITE foam to fill any tunnel/s and/or undermining. Change VAC dressing 3 X WEEK. Change canister as indicated when full. Nurse may titrate settings and frequency of dressing changes as clinically indicated. - Place white foam into wound in long strip and place where you can pull the white foam out, then place small piece black foam on top. Please drape wound as according. Start now please if possible. o Home Health Nurse may d/c VAC for s/s of increased infection, significant wound regression, or uncontrolled drainage. Notify Wound Healing Center at [phone]. o Number of foam/gauze pieces used in the dressing = Medications-please add to medication list. Wound #3 Right Gluteal fold o P.O. Antibiotics - Bactrim and Augmentin o Other: - Vitamin C, Zinc, Multivitamins IV antibiotics Electronic Signature(s) Signed: 01/04/2017 6:05:53 PM By: Baltazar Najjar MD Signed: 01/05/2017 5:33:09 PM By: Norris Cross, Dacy L. ([id number]) Entered By: Alejandro Mulling on 01/04/2017 08:28:35 Hewlett, Alishah L. ([id number]) -------------------------------------------------------------------------------- Problem List Details Patient Name: [name] Date of Service: 01/04/2017 8:00 AM Medical  Record Patient Account Number: [id number] Number: Treating RN: Phillis Haggis [birth year] (55 y.o. 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 09/21/2016 09/21/2016 severity Resolved Problems ICD-10 Code Description Active Date Resolved Date L89.323 Pressure ulcer of left buttock, stage 3 09/21/2016 09/21/2016 Electronic Signature(s) Signed: 01/04/2017 6:05:53 PM By: Baltazar Najjar MD Rinks, Thressa [name] ([id number]) Entered By: Baltazar Najjar on 01/04/2017 08:37:15 Asano, Thressa [name] ([id number]) -------------------------------------------------------------------------------- Progress Note Details Patient Name: [name] Date of Service: 01/04/2017 8:00 AM Medical Record Patient Account Number: [id number] Number: Treating RN: Phillis Haggis 07-28-1961 (55 y.o. Other Clinician: Date of Birth/Sex: Female) Treating Ghislaine Harcum Primary Care Physician/Extender: Leticia Penna Physician: Referring Physician: Tedra Senegal in Treatment: 15 Subjective Chief Complaint Information obtained from Patient Ms. Carruthers presents today for routine managament of bilateral ischial pressure ulcers and sinus tract to left popliteal fossa. History of Present Illness (HPI) The following HPI elements were documented for the patient's wound: Location: bilateral gluteal ulcerations and left popliteal ulceration with tunneling Quality: adits to intermittent aching to ischial ulcers, no discomfort to sinus tract Severity: right iscial ulcer with increased depth Duration: chronic ulcers to bilateral ischial ulcers and sinus tract Timing: The pain is intermittent in severity as far as how intense it becomes but is present all the time. Manipulationn makes this worse. Context: The wound  occurred when the patient had a fall and was unconscious for about 48 hours laying on the floor and she had pressure injury at that stage. Modifying Factors: Other treatment(s) tried include:as noted below she has been seen by visiting wound care physicians or nurse practitioners and details have been noted Associated Signs and Symptoms: has yet to receive offloading cushions and mattress overlay 56 year old patient who was seen by visiting [name] wound care specialist for a wound on both her buttock and was found to have an unstageable wound on the right buttock for about 2 months. I understand that she had a fall and was laying on the floor for about 48 hours before she was found and taken to the ICU and had a long injury to her gluteal area from pressure and also had broken her right humerus. She has had a right proximal humerus fracture and has been followed up with orthopedics recently. The patient has a past medical history of type 2 diabetes mellitus, paraparesis, acute pyelonephritis, GERD, hypertension, glaucoma, chronic pain, anxiety neurosis, nicotine dependence, COPD. the patient had some debridement done and was to operative was recommended to use Silvadene dressing and offloading. She is a smoker and occasionally smokes a few cigarettes. the patient requested a second opinion for months and is here to discuss her care. 09/21/16; the patient re-presents from home today for review of 3 different wounds. I note that she was seen in the clinic here in July at which time she had bilateral buttock wounds. It was apparently suggested at that time that she use a wound VAC bridged to both wounds just near the initial tuberosity's bilaterally which she refused. Ord,  Veretta L. ([id number]) The history was a bit difficult to put together. Apparently this patient became ill at the end of April of this year. She was found sitting on the floor she had apparently been for 2 days and subsequently  admitted to hospital from 04/23/16 through 05/02/16 and at that point she was critically ill ultimately having sepsis secondary to UTI, nontraumatic rhabdomyolysis and diabetic ketoacidosis. She had acute renal failure and I think required ICU care including intubation. Patient states her wounds actually started at that point on the bilateral issue tuberosities however in reviewing the discharge summary from 5/8 I see no reference to wounds at that point. It did state that she had left lower extremity cellulitis however. Reviewing Epic I see no relevant x-rays. It would appear that her discharge creatinine was within the normal range and indeed on 9/15 her creatinine has remained normal. [facility] There the wounds on her bilateral Botox were dressed. Only just before her discharge from the nursing facility she developed an "knot" which was interpreted as cellulitis on the posterior aspect of her left knee she was given antibiotics. Apparently sometime late in July a this actually opened and became a wound at home health care was tending to however she is still having purulent drainage coming from this and by my understanding the wound depth is actually become unmeasurable. I am not really clear about what home health has been placing in any of these wound areas. The patient states that is something with silver and it. She is not been systemically unwell no fever or chills her appetite is good. She is a diabetic poorly controlled however she states that her recent blood sugars at home have been in the low to mid 100s. 09/28/16 On evaluation today patient appears to continue to exhibit the 3 areas of ulceration that were noted previous. She did have an x-ray of the right pelvis which showed evidence of potential soft tissue infection but no obvious osteomyelitis. There was a discussion last office visit concerning the possibility of a  wound VAC. Witth that being said the x-ray report suggested that an MRI may be more appropriate to further evaluate the extent. Subsequently in regard to the wound over the popliteal portion of the left lower extremity with tunneling at 12:00 the CT scan that was ordered was denied by insurance as they state the patient has not had x-rays prior to advanced imaging. Patient states that she is frustrated with the situation overall. 10/05/16 in the interval since I last saw this patient last week she has had the x-ray of the knee performed. I did review that x-ray today and fortunately shows no evidence of osteomyelitis or other acute abnormality at this point in time. She continues to have the opening iin the posterior oral popliteal space with tracking proximal up the posterior thigh. Nothing seems to have worsened but it also seems to have not improved. The same is true in regard to the right pressure ulcer over the gluteal region which extends toward the ischium. The left gluteal pressure ulcer actually appears to be doing somewhat better my opinion there is some necrotic slough but overall this appears fairly well. She tells me thatt she has some discomfort especially when home health is helping her with dressing changes as they do not know her. At worse she rates her pain to be a 5 out of 10 right now it's more like a 1 out of  10. 10/07/16; still the patient has 3 different wound areas. She has a deep stage IV wound over her right ischial tuberosity. She is due to have an MRI next week. The wound over her left ischial tuberosity is more superficial and underwent debridement today. Finally she has a small open area in her left popliteal fossa the probes on measurably forward superiorly. Still a lot of drainage coming out of this. The CT scan that I ordered 3 weeks ago was questioned by her insurance company wanting a plain x-ray first. As I understand things result of this is nothing has been  done in 3 weeks in terms of imaging the thigh and she has an MRI booked of this along with her pelvis for next week line 10/12/16; patient has a deep probing wound over the right ischiall tuberosity, stage III wound over the left visual tuberosity and a draining sinus in her left popliteal fossa. None of this much different from when I saw this 3 weeks ago. We have been using silver rope to the right ischial wound and a draining area in the left popliteal fossa. Plain silver alginate to the area on the left ischial tuberosity 10/19/16; the patient's wounds are essentially unchanged although the area on the left lower gluteal is Suarez, Darlynn L. ([id number]) actually improved. Our intake nurse noted drainage from the right initial tuberosity probing wound as well as the draining area in the left popliteal fossa. Both of these were cultured. She had x-rays I think at the insistence of her insurance company on 09/23/16 x-ray of the pelvis was not particularly helpful she did have soft tissue air over the right lower pelvis although with the depth of this wound this is not surprising. An x-ray of her left knee did not show any specific abnormalities. We are still using silver alginate to these wound areas. Her MRI is booked for 10/27 10/26/16; cultures of the purulent drainage in her right initial tuberosity wound grew moderate Proteus and few staph aureus. The same organisms were cultured out of the left knee sinus tract posteriorly. The staph aureus is MRSA. I had started her on Augmentin last week I added doxycycline. The MRI of the left lower extremity and pelvis was finally done. The MRI of the femur showed subcutaneous soft tissue swelling edema fluid and myositis in the vastus lateralis muscle but no soft tissue abscess septic arthritis or osteomyelitis. MRI of the pelvis showed the left wound to be more expensive extending down to the bone there was osteomyelitis. Left hamstring tendons were also  involved. No septic arthritis involving the hip. The decubitus ulcer on the right side showed no definite osteomyelitis or abscess.. The right hip wound is actually the one the probes 6 cm downward. But the MRI showing infection including osteomyelitis on the left explains the draining sinus in the popliteal fossa on the left. She did have antibiotics in the hospitalization last time and this extended into her nursing home stay but I'm not exactly sure what antibiotics and for what duration. According the patient this did include vancomycin with considerable effort of our staff we are able to get the patient into see Dr. Sampson Goon today. There were transportation difficulties. Her mother had open heart surgery and is in the ICU in [place] therefore her brother was unable to transport. Dr. Jarrett Ables office graciously arranged time to see her today. From my point of view she is going to require IV vancomycin plus perhaps a third generation cephalosporin. I plan  to keep her on doxycycline and Augmentin until the IV antibiotics can be arranged. 11/02/2016 - Elisabella presents today for management of ulcers; She saw Dr. 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 She is to have the PICC line placed today and receive the initial dose for both antibiotics today. She has yet to receive the offloading chair cushion and/or mattress overlay from home health, apparently this has been a 3 week process. I encouraged her to speak to home health regarding this matter, along with offering home health to contact the wouns care center with any questions or concerns. The left ischial pressure ulcer continues to imporve, while to right ischial ulcer has increased in depth. The popliteal fossa sinus tract remains unmeasurable due to the limitation of depth measurement (tract extends beyond our measuring devices). 11-16-2016  Ms. Rubis presents today for evaluation and management of bilateral ischial stage IV pressure ulcers and sinus tract to the left popliteal fossa. she is under the care of Dr. Sampson Goon for IV antibiotic therapy; she states that the vancomycin was placed on hold and will be restarted at a lower dose based on her renal function. She continues taking Zosyn in addition to the vancomycin. She also states that she has yet to receive offloading cushions from home health, according to her she does not qualify for these offloading cushions because "the ulcers are unstageable ". We will contact the home health agency today to lend clarity regarding her pressure ulcers. The left popliteal fossa sinus tract continues to be a measurable as it extends beyond the length of our measuring devices.. 11/30/16; the patient is still on vancomycin and Zosyn. The depth of the draining sinus behind her left popliteal fossa is down to 4 cm although there is still serosanguineous drainage coming out of this. She saw Dr. Sampson Goon of infectious disease yesterday the idea is to weeks more of IV antibiotics and then oral antibiotics although I have not read his note. The area on the left gluteal fold is just about healed. She has a 6 cm draining sinus over the right initial tuberosity although I cannot feel bone at the base of this. As far as the patient is aware she has not had a recheck of her inflammatory markers. 12/07/16; patient is on vancomycin and Zosyn appointment with Dr. Sampson Goon on the 19th at which point the patient expects to have a change in antibiotics. Remarkable improvement over the wound over the left Rust, Avenell L. ([id number]) ischial tuberosity which is just about closed. The draining sinus in her popliteal fossa has 0.4 cm in depth. The area on the right ischial tuberosity still probes down 7 cm. This is closed and overall wound dimensions but not depth. 12/14/16; patient is completing her vancomycin  and Zosyn and per her she is going to transition to Bactrim and Augmentin for another 3 weeks. The area on her left gluteal fold is closed except for some skin tears. The area behind her left knee is no longer has any depth. The only remaining area that is of clinical concern is on the right gluteal fold probing towards the right ischial tuberosity. Today this measures 6.9 cm in depth. Very gritty surface 12/21/16; patient is now on Bactrim and Augmentin as directed by infectious disease. This should be for another 2 weeks. The area in her left gluteal fold and left popliteal are closed over and fully healed. Measurements today at 7 cm  in the right buttock wound is unchanged from last week. 12/27/16; patient is on Bactrim and Augmentin for another week as directed by infectious disease. She has completed her IV antibiotics. She is not been systemically unwell no fever no chills. The area on her right buttock measured over 6 cm in depth. There is no palpable bone. No evidence of surrounding soft tissue infection. She is complaining of tongue irritation and has a history of thrush 01/04/17; patient is been back to see Dr. Sampson Goon, her Augmentin was stopped but he continued the Bactrim for another 3 weeks. Depth of the wound is 6.7 cm there is been no major change in either direction. She is not receive the wound VAC from home health I think because of confusion about who is supposed to provided will actually talk to the [facility] The net no major change Objective Constitutional Sitting or standing Blood Pressure is within target range for patient.. Pulse regular and within target range for patient.Marland Kitchen Respirations regular, non-labored and within target range.. Patient's appearance is neat and clean. Appears in no acute distress. Well nourished and well developed.. Vitals Time Taken: 8:08 AM, Height: 63 in, Weight: 257 lbs, BMI: 45.5, Temperature: 98.0 F, Pulse: 79 bpm,  Respiratory Rate: 18 breaths/min, Blood Pressure: 115/73 mmHg. General Notes: Wound exam the left gluteal wound is still closed. Deep tunneling wound over the area of the right ischial tuberosity is unchanged in appearance and depth. I have been using a open curet and debridement and this blindly since the orifice of the wound is generally too small to see anything. There is a gritty surface nonviable subcutaneous tissue and removed. I really hope to have the wound VAC on this week. Surrounding subcutaneous tissue looks normal there is no erythema no palpable crepitus Integumentary (Hair, Skin) Wound #3 status is Open. Original cause of wound was Pressure Injury. The wound is located on the Right Gluteal fold. The wound measures 1cm length x 0.6cm width x 6.7cm depth; 0.471cm^2 area and 3.157cm^3 volume. There is fat exposed. There is no tunneling or undermining noted. There is a large amount of serous drainage noted. The wound margin is distinct with the outline attached to the wound base. There is medium (34-66%) granulation within the wound bed. There is a medium (34-66%) amount of necrotic tissue within the wound bed. The periwound skin appearance exhibited: Scarring, Maceration, Junkins, Ji L. ([id number]) Moist. The periwound skin appearance did not exhibit: Callus, Crepitus, Excoriation, Fluctuance, Friable, Induration, Localized Edema, Rash, Dry/Scaly, Atrophie Blanche, Cyanosis, Ecchymosis, Hemosiderin Staining, Mottled, Pallor, Rubor, Erythema. Periwound temperature was noted as No Abnormality. The periwound has tenderness on palpation. 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 There was a Skin/Subcutaneous Tissue Debridement ([id number])  debridement with total area of 0.6 sq cm performed by Maxwell Caul, MD. with the following instrument(s): scoop to remove Viable and Non-Viable tissue/material including Exudate, Fibrin/Slough, and Subcutaneous after achieving pain control using Lidocaine 4% Topical Solution. A time out was conducted at 08:23, prior to the start of the procedure. A Minimum amount of bleeding was controlled with Pressure. The procedure was tolerated well with a pain level of 0 throughout and a pain level of 0 following the procedure. Post Debridement Measurements: 1cm length x 0.6cm width  x 6.7cm depth; 3.157cm^3 volume. Post debridement Stage noted as Category/Stage IV. Character of Wound/Ulcer Post Debridement requires further debridement. Severity of Tissue Post Debridement is: Fat layer exposed. Post procedure Diagnosis Wound #3: Same as Pre-Procedure Plan Wound Cleansing: Wound #3 Right Gluteal fold: Clean wound with Normal Saline. Cleanse wound with mild soap and water Anesthetic: Hudman, Germani L. ([id number]) Wound #3 Right Gluteal fold: Topical Lidocaine 4% cream applied to wound bed prior to debridement - clinic use only Skin Barriers/Peri-Wound Care: Wound #3 Right Gluteal fold: Skin Prep Primary Wound Dressing: Wound #3 Right Gluteal fold: Aquacel Ag - or equivalent to Silver Alginate Rope pack lightly into wound and leave a tail where it can be pulled out and to cover the outside of the wound. Secondary Dressing: Wound #3 Right Gluteal fold: Dry Gauze Non-adherent pad - Telfa island, tape, or BFD Dressing Change Frequency: Wound #3 Right Gluteal fold: Change dressing every other day. Follow-up Appointments: Wound #3 Right Gluteal fold: Return Appointment in 1 week. Off-Loading: Wound #3 Right Gluteal fold: Roho cushion for wheelchair - *********[facility] to order*********** Home Health: Wound #3 Right Gluteal fold: Continue Home Health Visits - [facility]  *****Please order [facility] cushion****** Home Health Nurse may visit PRN to address patient s wound care needs. FACE TO FACE ENCOUNTER: MEDICARE and MEDICAID PATIENTS: I certify that this patient is under my care and that I had a face-to-face encounter that meets the physician face-to-face encounter requirements with this patient on this date. The encounter with the patient was in whole or in part for the following MEDICAL CONDITION: (primary reason for Home Healthcare) MEDICAL NECESSITY: I certify, that based on my findings, NURSING services are a medically necessary home health service. HOME BOUND STATUS: I certify that my clinical findings support that this patient is homebound (i.e., Due to illness or injury, pt requires aid of supportive devices such as crutches, cane, wheelchairs, walkers, the use of special transportation or the assistance of another person to leave their place of residence. There is a normal inability to leave the home and doing so requires considerable and taxing effort. Other absences are for medical reasons / religious services and are infrequent or of short duration when for other reasons). If current dressing causes regression in wound condition, may D/C ordered dressing product/s and apply Normal Saline Moist Dressing daily until next Wound Healing Center / Other MD appointment. Notify Wound Healing Center of regression in wound condition at [phone]. Please direct any NON-WOUND related issues/requests for orders to patient's Primary Care Physician Negative Pressure Wound Therapy: Wound #3 Right Gluteal fold: Wound VAC settings at 125/130 mmHg continuous pressure. Use BLACK/GREEN foam to wound cavity. Use WHITE foam to fill any tunnel/s and/or undermining. Change VAC dressing 3 X WEEK. Change canister as indicated when full. Nurse may titrate settings and frequency of dressing changes as clinically indicated. - Place white foam into wound in long strip and place  where you can pull the white foam out, then place small piece black foam on top. Please drape wound as according. Start now please if possible. Home Health Nurse may d/c VAC for s/s of increased infection, significant wound regression, or uncontrolled drainage. Notify Wound Healing Center at [phone]. Number of foam/gauze pieces used in the dressing = Medications-please add to medication list.: Flagler, Samani L. ([id number]) Wound #3 Right Gluteal fold: P.O. Antibiotics - Bactrim and Augmentin Other: - Vitamin C, Zinc, Multivitamins IV antibiotics Again I wound like to start the  wound vac Aquacel ag packing for now antibiotic changes by ID noted Electronic Signature(s) Signed: 01/04/2017 6:05:53 PM By: Baltazar Najjar MD Entered By: Baltazar Najjar on 01/04/2017 08:43:02 Clayton, Thressa [name] ([id number]) -------------------------------------------------------------------------------- SuperBill Details Patient Name: [name] Date of Service: 01/04/2017 Medical Record Patient Account Number: [id number] Number: Treating RN: Phillis Haggis [birth year] (55 y.o. Other Clinician: Date of Birth/Sex: Female) Treating Nilan Iddings Primary Care Physician/Extender: Leticia Penna Physician: Weeks in Treatment: 15 Referring Physician: Joen [name] Diagnosis Coding ICD-10 Codes Code Description L89.314 Pressure ulcer of right buttock, stage 4 M86.18 Other acute osteomyelitis, other site E11.42 Type 2 diabetes mellitus with diabetic polyneuropathy Facility Procedures CPT4 Code: [id number] Description: 11042 - DEB SUBQ TISSUE 20 SQ CM/< ICD-10 Description Diagnosis L89.314 Pressure ulcer of right buttock, stage 4 Modifier: Quantity: 1 Physician Procedures CPT4 Code: [id number] Description: 11042 - WC PHYS SUBQ TISS 20 SQ CM ICD-10 Description Diagnosis L89.314 Pressure ulcer of right buttock, stage 4 Modifier: Quantity: 1 Electronic Signature(s) Signed: 01/04/2017 6:05:53 PM By:  Baltazar Najjar MD Entered By: Baltazar Najjar on 01/04/2017 08:43:27

## 2017-01-06 NOTE — Progress Notes (Signed)
[name], [name] ([id number]) Visit Report for 01/04/2017 Arrival Information Details Patient Name: [name], [name] Date of Service: 01/04/2017 8:00 AM Medical Record Patient Account Number: [id number] Number: Treating RN: Ahmed Prima January 12, 1961 (55 y.o. Other Clinician: Date of Birth/Sex: Female) Treating ROBSON, MICHAEL Primary Care Physician: Lavera Guise Physician/Extender: G Referring Physician: Sallee Lange in Treatment: 15 Visit Information History Since Last Visit All ordered tests and consults were completed: No Patient Arrived: Wheel Chair Added or deleted any medications: No Arrival Time: 08:07 Any new allergies or adverse reactions: No Accompanied By: self Had a fall or experienced change in No Transfer Assistance: [employer] activities of daily living that may affect Patient Lift risk of falls: Patient Identification Verified: Yes Signs or symptoms of abuse/neglect since last No Secondary Verification Process Yes visito Completed: Hospitalized since last visit: No Patient Requires Transmission- No Has Dressing in Place as Prescribed: Yes Based Precautions: Pain Present Now: No Patient Has Alerts: Yes Patient Alerts: DM II Electronic Signature(s) Signed: 01/05/2017 5:33:09 PM By: Alric Quan Entered By: Alric Quan on 01/04/2017 08:08:13 [name], [name] ([id number]) -------------------------------------------------------------------------------- Encounter Discharge Information Details Patient Name: [name] Date of Service: 01/04/2017 8:00 AM Medical Record Patient Account Number: [id number] Number: Treating RN: Ahmed Prima [birth year] (56 y.o. Other Clinician: Date of Birth/Sex: Female) Treating ROBSON, MICHAEL Primary Care Physician: Lavera Guise Physician/Extender: G Referring Physician: Sallee Lange in Treatment: 15 Encounter Discharge Information Items Discharge Pain Level: 0 Discharge Condition:  Stable Ambulatory Status: Wheelchair Discharge Destination: Home Transportation: Other Accompanied By: self Schedule Follow-up Appointment: Yes Medication Reconciliation completed Yes and provided to Patient/Care Labrisha Wuellner: Patient Clinical Summary of Care: Declined Electronic Signature(s) Signed: 01/04/2017 8:51:08 AM By: Ruthine Dose Entered By: Ruthine Dose on 01/04/2017 08:51:08 [name], [name] ([id number]) -------------------------------------------------------------------------------- Lower Extremity Assessment Details Patient Name: [name] Date of Service: 01/04/2017 8:00 AM Medical Record Patient Account Number: [id number] Number: Treating RN: Ahmed Prima [birth year] (56 y.o. Other Clinician: Date of Birth/Sex: Female) Treating ROBSON, MICHAEL Primary Care Physician: Lavera Guise Physician/Extender: G Referring Physician: Sallee Lange in Treatment: 15 Electronic Signature(s) Signed: 01/05/2017 5:33:09 PM By: Alric Quan Entered By: Alric Quan on 01/04/2017 08:18:46 [name], [name] ([id number]) -------------------------------------------------------------------------------- Multi Wound Chart Details Patient Name: [name] Date of Service: 01/04/2017 8:00 AM Medical Record Patient Account Number: [id number] Number: Treating RN: Ahmed Prima [birth year] (56 y.o. Other Clinician: Date of Birth/Sex: Female) Treating ROBSON, MICHAEL Primary Care Physician: Lavera Guise Physician/Extender: G Referring Physician: Sallee Lange in Treatment: 15 Vital Signs Height(in): 63 Pulse(bpm): 79 Weight(lbs): 257 Blood Pressure 115/73 (mmHg): Body Mass Index(BMI): 46 Temperature(F): 98.0 Respiratory Rate 18 (breaths/min): Photos: [3:No Photos] [N/A:N/A] Wound Location: [3:Right Gluteal fold] [N/A:N/A] Wounding Event: [3:Pressure Injury] [N/A:N/A] Primary Etiology: [3:Pressure Ulcer] [N/A:N/A] Comorbid History:  [3:Asthma, Hypertension, Type II Diabetes, Neuropathy] [N/A:N/A] Date Acquired: [3:04/21/2016] [N/A:N/A] Weeks of Treatment: [3:15] [N/A:N/A] Wound Status: [3:Open] [N/A:N/A] Measurements L x W x D 1x0.6x6.7 [N/A:N/A] (cm) Area (cm) : [3:0.471] [N/A:N/A] Volume (cm) : [3:3.157] [N/A:N/A] % Reduction in Area: [3:84.70%] [N/A:N/A] % Reduction in Volume: 81.60% [N/A:N/A] Classification: [3:Category/Stage IV] [N/A:N/A] Exudate Amount: [3:Large] [N/A:N/A] Exudate Type: [3:Serous] [N/A:N/A] Exudate Color: [3:amber] [N/A:N/A] Foul Odor After [3:Yes] [N/A:N/A] Cleansing: Odor Anticipated Due to No [N/A:N/A] Product Use: Wound Margin: [3:Distinct, outline attached] [N/A:N/A] Granulation Amount: [3:Medium (34-66%)] [N/A:N/A] Necrotic Amount: [3:Medium (34-66%)] [N/A:N/A] Exposed Structures: [3:Fat: Yes] [N/A:N/A] Epithelialization: None N/A N/A Debridement: Debridement ([id number]- N/A N/A [id number]) Pre-procedure 08:23 N/A N/A Verification/Time Out Taken:  Pain Control: Lidocaine 4% Topical N/A N/A Solution Tissue Debrided: Fibrin/Slough, Exudates, N/A N/A Subcutaneous Level: Skin/Subcutaneous N/A N/A Tissue Debridement Area (sq 0.6 N/A N/A cm): Instrument: Other(scoop) N/A N/A Bleeding: Minimum N/A N/A Hemostasis Achieved: Pressure N/A N/A Procedural Pain: 0 N/A N/A Post Procedural Pain: 0 N/A N/A Debridement Treatment Procedure was tolerated N/A N/A Response: well Post Debridement 1x0.6x6.7 N/A N/A Measurements L x W x D (cm) Post Debridement 3.157 N/A N/A Volume: (cm) Post Debridement Category/Stage IV N/A N/A Stage: Periwound Skin Texture: Scarring: Yes N/A N/A Edema: No Excoriation: No Induration: No Callus: No Crepitus: No Fluctuance: No Friable: No Rash: No Periwound Skin Maceration: Yes N/A N/A Moisture: Moist: Yes Dry/Scaly: No Periwound Skin Color: Atrophie Blanche: No N/A N/A Cyanosis: No Ecchymosis: No Erythema: No Hemosiderin Staining: No Mottled:  No Pallor: No Rubor: No Temperature: No Abnormality N/A N/A Yes N/A N/A [name], [name] ([id number]) Tenderness on Palpation: Wound Preparation: Ulcer Cleansing: N/A N/A Rinsed/Irrigated with Saline Topical Anesthetic Applied: Other: lidocaine 4% Procedures Performed: Debridement N/A N/A Treatment Notes Wound #3 (Right Gluteal fold) 1. Cleansed with: Clean wound with Normal Saline Cleanse wound with antibacterial soap and water 2. Anesthetic Topical Lidocaine 4% cream to wound bed prior to debridement 4. Dressing Applied: Aquacel Ag 5. Secondary Dressing Applied Dry Gauze [place] Notes Aquacel Ag rope Electronic Signature(s) Signed: 01/04/2017 6:05:53 PM By: Linton Ham MD Entered By: Linton Ham on 01/04/2017 08:37:38 [place], [name] ([id number]) -------------------------------------------------------------------------------- [place] Details Patient Name: [name] Date of Service: 01/04/2017 8:00 AM Medical Record Patient Account Number: [id number] Number: Treating RN: Ahmed Prima 21-Jul-1961 (56 y.o. Other Clinician: Date of Birth/Sex: Female) Treating ROBSON, [place] Primary Care Physician: Lavera Guise Physician/Extender: G Referring Physician: Sallee Lange in Treatment: 15 Active Inactive Abuse / Safety / Falls / Self Care Management Nursing Diagnoses: Potential for falls Goals: Patient will remain injury free Date Initiated: 09/21/2016 Goal Status: Active Interventions: Assess fall risk on admission and as needed Notes: Nutrition Nursing Diagnoses: Imbalanced nutrition Potential for alteratiion in Nutrition/Potential for imbalanced nutrition Goals: Patient/caregiver agrees to and verbalizes understanding of need to use nutritional supplements and/or vitamins as prescribed Date Initiated: 09/21/2016 Goal Status: Active Patient/caregiver verbalizes understanding of need to maintain therapeutic  glucose control per primary care physician Date Initiated: 09/21/2016 Goal Status: Active Interventions: Assess patient nutrition upon admission and as needed per policy Notes: Kolinski, [place] ([id number]) Orientation to the Wound Care Program Nursing Diagnoses: Knowledge deficit related to the wound healing center program Goals: Patient/caregiver will verbalize understanding of the [place] Date Initiated: 09/21/2016 Goal Status: Active Interventions: Provide education on orientation to the wound center Notes: Pain, Acute or Chronic Nursing Diagnoses: Pain, acute or chronic: actual or potential Potential alteration in comfort, pain Goals: Patient will verbalize adequate pain control and receive pain control interventions during procedures as needed Date Initiated: 09/21/2016 Goal Status: Active Patient/caregiver will verbalize adequate pain control between visits Date Initiated: 09/21/2016 Goal Status: Active Patient/caregiver will verbalize comfort level met Date Initiated: 09/21/2016 Goal Status: Active Interventions: Assess comfort goal upon admission Complete pain assessment as per visit requirements Notes: Wound/Skin Impairment Nursing Diagnoses: Impaired tissue integrity Goals: Ulcer/skin breakdown will have a volume reduction of 30% by week 4 Date Initiated: 09/21/2016 [name], [name] ([id number]) Goal Status: Active Ulcer/skin breakdown will have a volume reduction of 50% by week 8 Date Initiated: 09/21/2016 Goal Status: Active Ulcer/skin breakdown will have a volume reduction of 80% by week  12 Date Initiated: 09/21/2016 Goal Status: Active Interventions: Assess ulceration(s) every visit Notes: Electronic Signature(s) Signed: 01/05/2017 5:33:09 PM By: Alric Quan Entered By: Alric Quan on 01/04/2017 08:18:51 [name], [name] ([id number]) -------------------------------------------------------------------------------- Pain Assessment  Details Patient Name: [name] Date of Service: 01/04/2017 8:00 AM Medical Record Patient Account Number: [id number] Number: Treating RN: Ahmed Prima 06/02/1961 (56 y.o. Other Clinician: Date of Birth/Sex: Female) Treating ROBSON, MICHAEL Primary Care Physician: Lavera Guise Physician/Extender: G Referring Physician: Sallee Lange in Treatment: 15 Active Problems Location of Pain Severity and Description of Pain Patient Has Paino No Site Locations With Dressing Change: No Pain Management and Medication Current Pain Management: Electronic Signature(s) Signed: 01/05/2017 5:33:09 PM By: Alric Quan Entered By: Alric Quan on 01/04/2017 08:08:21 [name], [name] ([id number]) -------------------------------------------------------------------------------- Patient/Caregiver Education Details Patient Name: [name] Date of Service: 01/04/2017 8:00 AM Medical Record Patient Account Number: [id number] Number: Treating RN: Ahmed Prima 17-Feb-1961 (56 y.o. Other Clinician: Date of Birth/Gender: Female) Treating ROBSON, MICHAEL Primary Care Physician: Lavera Guise Physician/Extender: G Referring Physician: Sallee Lange in Treatment: 15 Education Assessment Education Provided To: Patient Education Topics Provided Wound/Skin Impairment: Handouts: Other: change dressing as ordered Methods: Demonstration, Explain/Verbal Responses: State content correctly Electronic Signature(s) Signed: 01/05/2017 5:33:09 PM By: Alric Quan Entered By: Alric Quan on 01/04/2017 08:29:49 [name], [name] ([id number]) -------------------------------------------------------------------------------- Wound Assessment Details Patient Name: [name] Date of Service: 01/04/2017 8:00 AM Medical Record Patient Account Number: [id number] Number: Treating RN: Ahmed Prima 1961-04-10 (56 y.o. Other Clinician: Date of  Birth/Sex: Female) Treating ROBSON, MICHAEL Primary Care Physician: Lavera Guise Physician/Extender: G Referring Physician: Sallee Lange in Treatment: 15 Wound Status Wound Number: 3 Primary Pressure Ulcer Etiology: Wound Location: Right Gluteal fold Wound Status: Open Wounding Event: Pressure Injury Comorbid Asthma, Hypertension, Type II Date Acquired: 04/21/2016 History: Diabetes, Neuropathy Weeks Of Treatment: 15 Clustered Wound: No Photos Photo Uploaded By: Alric Quan on 01/04/2017 10:18:05 Wound Measurements Length: (cm) 1 Width: (cm) 0.6 Depth: (cm) 6.7 Area: (cm) 0.471 Volume: (cm) 3.157 % Reduction in Area: 84.7% % Reduction in Volume: 81.6% Epithelialization: None Tunneling: No Undermining: No Wound Description Classification: Category/Stage IV Wound Margin: Distinct, outline attached Exudate Amount: Large Exudate Type: Serous Exudate Color: amber Foul Odor After Cleansing: Yes Due to Product Use: No Wound Bed Granulation Amount: Medium (34-66%) Exposed Structure Necrotic Amount: Medium (34-66%) Fat Layer Exposed: Yes [name], [name] ([id number]) Periwound Skin Texture Texture Color No Abnormalities Noted: No No Abnormalities Noted: No Callus: No Atrophie Blanche: No Crepitus: No Cyanosis: No Excoriation: No Ecchymosis: No Fluctuance: No Erythema: No Friable: No Hemosiderin Staining: No Induration: No Mottled: No Localized Edema: No Pallor: No Rash: No Rubor: No Scarring: Yes Temperature / Pain Moisture Temperature: No Abnormality No Abnormalities Noted: No Tenderness on Palpation: Yes Dry / Scaly: No Maceration: Yes Moist: Yes Wound Preparation Ulcer Cleansing: Rinsed/Irrigated with Saline Topical Anesthetic Applied: Other: lidocaine 4%, Treatment Notes Wound #3 (Right Gluteal fold) 1. Cleansed with: Clean wound with Normal Saline Cleanse wound with antibacterial soap and water 2. Anesthetic Topical Lidocaine 4%  cream to wound bed prior to debridement 4. Dressing Applied: Aquacel Ag 5. Secondary Dressing Applied Dry [place] Notes Aquacel Ag rope Electronic Signature(s) Signed: 01/05/2017 5:33:09 PM By: Alric Quan Entered By: Alric Quan on 01/04/2017 08:17:18 [name], [name] ([id number]) -------------------------------------------------------------------------------- Vitals Details Patient Name: [name] Date of Service: 01/04/2017 8:00 AM Medical Record Patient Account Number: [id number] Number: Treating RN:  Carolyne Fiscal, Debi 02/03/1961 (56 y.o. Other Clinician: Date of Birth/Sex: Female) Treating ROBSON, MICHAEL Primary Care Physician: Lavera Guise Physician/Extender: G Referring Physician: Sallee Lange in Treatment: 15 Vital Signs Time Taken: 08:08 Temperature (F): 98.0 Height (in): 63 Pulse (bpm): 79 Weight (lbs): 257 Respiratory Rate (breaths/min): 18 Body Mass Index (BMI): 45.5 Blood Pressure (mmHg): 115/73 Reference Range: 80 - 120 mg / dl Electronic Signature(s) Signed: 01/05/2017 5:33:09 PM By: Alric Quan Entered By: Alric Quan on 01/04/2017 17:49:44

## 2017-01-11 ENCOUNTER — Ambulatory Visit: Payer: Managed Care, Other (non HMO) | Admitting: Internal Medicine

## 2017-01-18 ENCOUNTER — Encounter: Payer: Managed Care, Other (non HMO) | Admitting: Internal Medicine

## 2017-01-18 DIAGNOSIS — L89314 Pressure ulcer of right buttock, stage 4: Secondary | ICD-10-CM | POA: Diagnosis not present

## 2017-01-20 NOTE — Progress Notes (Signed)
DAJAI, WAHLERT ([id number]) Visit Report for 01/18/2017 Arrival Information Details Patient Name: [name], [name] Date of Service: 01/18/2017 8:00 AM Medical Record Patient Account Number: [id number] Number: Treating RN: Ahmed Prima January 21, 1961 (55 y.o. Other Clinician: Date of Birth/Sex: Female) Treating ROBSON, MICHAEL Primary Care Marlea Gambill: [name] Referring Priscila Bean: Sallee Lange in Treatment: 67 Visit Information History Since Last Visit All ordered tests and consults were completed: No Patient Arrived: Wheel Chair Added or deleted any medications: No Arrival Time: 08:14 Any new allergies or adverse reactions: No Accompanied By: self Had a fall or experienced change in No Transfer Assistance: Easy[employer] activities of daily living that may affect Patient Lift risk of falls: Patient Identification Verified: Yes Signs or symptoms of abuse/neglect since last No Secondary Verification Process Yes visito Completed: Hospitalized since last visit: No Patient Requires Transmission- No Has Dressing in Place as Prescribed: Yes Based Precautions: Pain Present Now: No Patient Has Alerts: Yes Patient Alerts: DM II Electronic Signature(s) Signed: 01/19/2017 3:37:28 PM By: Alric Quan Entered By: Alric Quan on 01/18/2017 08:16:34 Pitzer, [name] ([id number]) -------------------------------------------------------------------------------- Encounter Discharge Information Details Patient Name: [name] Date of Service: 01/18/2017 8:00 AM Medical Record Patient Account Number: [id number] Number: Treating RN: Ahmed Prima 1961/10/15 (56 y.o. Other Clinician: Date of Birth/Sex: Female) Treating ROBSON, MICHAEL Primary Care Alonah Lineback: [name]/Extender: [name] Referring Tahjae Durr: Sallee Lange in Treatment: 17 Encounter Discharge Information Items Discharge Pain Level: 0 Discharge Condition:  Stable Ambulatory Status: Wheelchair Discharge Destination: Home Transportation: Other Accompanied By: self Schedule Follow-up Appointment: Yes Medication Reconciliation completed Yes and provided to Patient/Care Olga Bourbeau: Patient Clinical Summary of Care: Declined Electronic Signature(s) Signed: 01/19/2017 3:37:28 PM By: Alric Quan Previous Signature: 01/18/2017 9:15:26 AM Version By: Ruthine Dose Entered By: Alric Quan on 01/18/2017 09:39:03 Kluth, [name] ([id number]) -------------------------------------------------------------------------------- Lower Extremity Assessment Details Patient Name: [name] Date of Service: 01/18/2017 8:00 AM Medical Record Patient Account Number: [id number] Number: Treating RN: Ahmed Prima 1961-02-08 (56 y.o. Other Clinician: Date of Birth/Sex: Female) Treating ROBSON, MICHAEL Primary Care Bettyjean Stefanski: [name] Antonieta [name]/Extender: [name] Referring Giavanni Zeitlin: Sallee Lange in Treatment: 17 Electronic Signature(s) Signed: 01/19/2017 3:37:28 PM By: Alric Quan Entered By: Alric Quan on 01/18/2017 08:29:56 Arechiga, [name] ([id number]) -------------------------------------------------------------------------------- Multi Wound Chart Details Patient Name: [name] Date of Service: 01/18/2017 8:00 AM Medical Record Patient Account Number: [id number] Number: Treating RN: Ahmed Prima 12-Jul-1961 (56 y.o. Other Clinician: Date of Birth/Sex: Female) Treating ROBSON, MICHAEL Primary Care Carla Whilden: [name]/Extender: [name] Referring Tyson Masin: Sallee Lange in Treatment: 17 Vital Signs Height(in): 63 Pulse(bpm): 86 Weight(lbs): 257 Blood Pressure 112/69 (mmHg): Body Mass Index(BMI): 46 Temperature(F): 98.1 Respiratory Rate 18 (breaths/min): Photos: [3:No Photos] [N/A:N/A] Wound Location: [3:Right Gluteal fold] [N/A:N/A] Wounding Event: [3:Pressure Injury]  [N/A:N/A] Primary Etiology: [3:Pressure Ulcer] [N/A:N/A] Comorbid History: [3:Asthma, Hypertension, Type II Diabetes, Neuropathy] [N/A:N/A] Date Acquired: [3:04/21/2016] [N/A:N/A] Weeks of Treatment: [3:17] [N/A:N/A] Wound Status: [3:Open] [N/A:N/A] Measurements L x W x D 1x0.5x6.7 [N/A:N/A] (cm) Area (cm) : [3:0.393] [N/A:N/A] Volume (cm) : [3:2.631] [N/A:N/A] % Reduction in Area: [3:87.20%] [N/A:N/A] % Reduction in Volume: 84.70% [N/A:N/A] Classification: [3:Category/Stage IV] [N/A:N/A] Exudate Amount: [3:Large] [N/A:N/A] Exudate Type: [3:Serous] [N/A:N/A] Exudate Color: [3:amber] [N/A:N/A] Foul Odor After [3:Yes] [N/A:N/A] Cleansing: Odor Anticipated Due to No [N/A:N/A] Product Use: Wound Margin: [3:Distinct, outline attached] [N/A:N/A] Granulation Amount: [3:Medium (34-66%)] [N/A:N/A] Necrotic Amount: [3:Medium (34-66%)] [N/A:N/A] Exposed Structures: [N/A:N/A] Fat Layer (Subcutaneous Tissue) Exposed: Yes Epithelialization: None N/A N/A  Debridement: Debridement (40102- N/A N/A 11047) Pre-procedure 08:32 N/A N/A Verification/Time Out Taken: Pain Control: Lidocaine 4% Topical N/A N/A Solution Tissue Debrided: Fibrin/Slough, Exudates, N/A N/A Subcutaneous Level: Skin/Subcutaneous N/A N/A Tissue Debridement Area (sq 0.5 N/A N/A cm): Instrument: Other(scoop) N/A N/A Bleeding: Minimum N/A N/A Hemostasis Achieved: Pressure N/A N/A Procedural Pain: 0 N/A N/A Post Procedural Pain: 0 N/A N/A Debridement Treatment Procedure was tolerated N/A N/A Response: well Post Debridement 1x0.5x6.7 N/A N/A Measurements L x W x D (cm) Post Debridement 2.631 N/A N/A Volume: (cm) Post Debridement Category/Stage IV N/A N/A Stage: Periwound Skin Texture: Scarring: Yes N/A N/A Excoriation: No Induration: No Callus: No Crepitus: No Rash: No Periwound Skin Maceration: Yes N/A N/A Moisture: Dry/Scaly: No Periwound Skin Color: Atrophie Blanche: No N/A N/A Cyanosis:  No Ecchymosis: No Erythema: No Hemosiderin Staining: No Mottled: No Pallor: No Rubor: No Temperature: No Abnormality N/A N/A Tenderness on Yes N/A N/A Palpation: Wound Preparation: N/A N/A [name], [name] ([id number]) Ulcer Cleansing: Rinsed/Irrigated with Saline Topical Anesthetic Applied: Other: lidocaine 4% Procedures Performed: Debridement N/A N/A Treatment Notes Wound #3 (Right Gluteal fold) 1. Cleansed with: Clean wound with Normal Saline 2. Anesthetic Topical Lidocaine 4% cream to wound bed prior to debridement 3. Peri-wound Care: Skin Prep 8. Negative Pressure Wound Therapy Wound Vac to wound continuously at 150m/hg pressure White Foam Number of foam/gauze pieces used in the dressing = Notes 2 PIECES WHITE FOAM 1[place]burgSignature(s) Signed: 01/18/2017 6:11:14 PM By: RLinton HamMD Entered By: RLinton Hamon 01/18/2017 10:42:25 Verdell, TGeorgia [name]([id number] -------------------------------------------------------------------------------- Multi-Disciplinary Care Plan Details Patient Name: WBenedetto GoadDate of Service: 01/18/2017 8:00 AM Medical Record Patient Account Number: [id number]: Treating RN: PAhmed Prima311-10-1961(56y.o. Other Clinician: Date of Birth/Sex: Female) Treating ROBSON, MICHAEL Primary Care Sofia Jaquith: BLavera GuiseProvider/Extender: [name] Referring Jamie Belger: BSallee Langein Treatment: 17 Active Inactive ` Abuse / Safety / Falls / Self Care Management Nursing Diagnoses: Potential for falls Goals: Patient will remain injury free Date Initiated: 09/21/2016 Target Resolution Date: 03/25/2017 Goal Status: Active Interventions: Assess fall risk on admission and as needed Notes: ` Nutrition Nursing Diagnoses: Imbalanced nutrition Potential for alteratiion in Nutrition/Potential for imbalanced nutrition Goals: Patient/caregiver agrees to and verbalizes understanding of need  to use nutritional supplements and/or vitamins as prescribed Date Initiated: 09/21/2016 Target Resolution Date: 03/25/2017 Goal Status: Active Patient/caregiver verbalizes understanding of need to maintain therapeutic glucose control per primary care physician Date Initiated: 09/21/2016 Target Resolution Date: 03/25/2017 Goal Status: Active Interventions: Assess patient nutrition upon admission and as needed per policy Sarinana, TMount Kisco ([id number] Notes: ` Orientation to the Wound Care Program Nursing Diagnoses: Knowledge deficit related to the wound healing center program Goals: Patient/caregiver will verbalize understanding of the WManchesterDate Initiated: 09/21/2016 Target Resolution Date: 03/25/2017 Goal Status: Active Interventions: Provide education on orientation to the wound center Notes: ` Pain, Acute or Chronic Nursing Diagnoses: Pain, acute or chronic: actual or potential Potential alteration in comfort, pain Goals: Patient will verbalize adequate pain control and receive pain control interventions during procedures as needed Date Initiated: 09/21/2016 Target Resolution Date: 03/25/2017 Goal Status: Active Patient/caregiver will verbalize adequate pain control between visits Date Initiated: 09/21/2016 Target Resolution Date: 03/25/2017 Goal Status: Active Patient/caregiver will verbalize comfort level met Date Initiated: 09/21/2016 Target Resolution Date: 03/25/2017 Goal Status: Active Interventions: Assess comfort goal upon admission Complete pain assessment as per visit requirements Notes: ` Wound/Skin Impairment Nursing Diagnoses: [place],  Alaija L. ([id number]) Impaired tissue integrity Goals: Ulcer/skin breakdown will have a volume reduction of 30% by week 4 Date Initiated: 09/21/2016 Target Resolution Date: 03/25/2017 Goal Status: Active Ulcer/skin breakdown will have a volume reduction of 50% by week 8 Date Initiated: 09/21/2016 Target  Resolution Date: 03/25/2017 Goal Status: Active Ulcer/skin breakdown will have a volume reduction of 80% by week 12 Date Initiated: 09/21/2016 Target Resolution Date: 03/25/2017 Goal Status: Active Interventions: Assess ulceration(s) every visit Notes: Electronic Signature(s) Signed: 01/19/2017 3:37:28 PM By: Alric Quan Entered By: Alric Quan on 01/18/2017 08:32:37 Burkett, Enora Carlean Jews ([id number]) -------------------------------------------------------------------------------- Pain Assessment Details Patient Name: [name] Date of Service: 01/18/2017 8:00 AM Medical Record Patient Account Number: [id number] Number: Treating RN: Ahmed Prima [birth year] (56 y.o. Other Clinician: Date of Birth/Sex: Female) Treating ROBSON, MICHAEL Primary Care Myya Meenach: [name] Davidson Palmieri/Extender: [name] Referring Collyn Ribas: Sallee Lange in Treatment: 17 Active Problems Location of Pain Severity and Description of Pain Patient Has Paino No Site Locations With Dressing Change: No Pain Management and Medication Current Pain Management: Electronic Signature(s) Signed: 01/19/2017 3:37:28 PM By: Alric Quan Entered By: Alric Quan on 01/18/2017 08:16:40 Broad, [name] ([id number]) -------------------------------------------------------------------------------- Patient/Caregiver Education Details Patient Name: [name] Date of Service: 01/18/2017 8:00 AM Medical Record Patient Account Number: [id number] Number: Treating RN: Ahmed Prima 06-30-1961 (56 y.o. Other Clinician: Date of Birth/Gender: Female) Treating ROBSON, MICHAEL Primary Care Physician: [name] Physician/Extender: [name] Referring Physician: Sallee Lange in Treatment: 17 Education Assessment Education Provided To: Patient Education Topics Provided Wound/Skin Impairment: Handouts: Other: CHANGE WOUND VAC AS ORDERED Methods: Demonstration, Explain/Verbal Responses:  State content correctly Electronic Signature(s) Signed: 01/19/2017 3:37:28 PM By: Alric Quan Entered By: Alric Quan on 01/18/2017 09:39:26 Lapierre, [name] ([id number]) -------------------------------------------------------------------------------- Wound Assessment Details Patient Name: [name] Date of Service: 01/18/2017 8:00 AM Medical Record Patient Account Number: [id number] Number: Treating RN: Ahmed Prima 06-May-1961 (56 y.o. Other Clinician: Date of Birth/Sex: Female) Treating ROBSON, MICHAEL Primary Care Navea Woodrow: [name] Argus Caraher/Extender: [name] Referring Aleigh Grunden: Sallee Lange in Treatment: 17 Wound Status Wound Number: 3 Primary Pressure Ulcer Etiology: Wound Location: Right Gluteal fold Wound Status: Open Wounding Event: Pressure Injury Comorbid Asthma, Hypertension, Type II Date Acquired: 04/21/2016 History: Diabetes, Neuropathy Weeks Of Treatment: 17 Clustered Wound: No Photos Photo Uploaded By: Alric Quan on 01/18/2017 13:14:15 Wound Measurements Length: (cm) 1 Width: (cm) 0.5 Depth: (cm) 6.7 Area: (cm) 0.393 Volume: (cm) 2.631 % Reduction in Area: 87.2% % Reduction in Volume: 84.7% Epithelialization: None Tunneling: No Undermining: No Wound Description Classification: Category/Stage IV Foul Odor Afte Wound Margin: Distinct, outline attached Due to Product Exudate Amount: Large Exudate Type: Serous Exudate Color: amber r Cleansing: Yes Use: No Wound Bed Granulation Amount: Medium (34-66%) Exposed Structure Necrotic Amount: Medium (34-66%) Fat Layer (Subcutaneous Tissue) Exposed: Yes Periwound Skin Texture Texture Color Anguiano, Neomi L. ([id number]) No Abnormalities Noted: No No Abnormalities Noted: No Callus: No Atrophie Blanche: No Crepitus: No Cyanosis: No Excoriation: No Ecchymosis: No Induration: No Erythema: No Rash: No Hemosiderin Staining: No Scarring: Yes Mottled: No Pallor:  No Moisture Rubor: No No Abnormalities Noted: No Dry / Scaly: No Temperature / Pain Maceration: Yes Temperature: No Abnormality Tenderness on Palpation: Yes Wound Preparation Ulcer Cleansing: Rinsed/Irrigated with Saline Topical Anesthetic Applied: Other: lidocaine 4%, Treatment Notes Wound #3 (Right Gluteal fold) 1. Cleansed with: Clean wound with Normal Saline 2. Anesthetic Topical Lidocaine 4% cream to wound bed prior to debridement 3. Peri-wound Care: Skin Prep 8. Negative  Pressure Wound Therapy Wound Vac to wound continuously at 110m/hg pressure White Foam Number of foam/gauze pieces used in the dressing = Notes 2 PIECES WHITE FOAM 1 PIECE BLACK FOAM TO BRIDGE Electronic Signature(s) Signed: 01/19/2017 3:37:28 PM By: PAlric QuanEntered By: PAlric Quanon 01/18/2017 08:29:47 Rehfeld, TGeorgia [name]([id number] -------------------------------------------------------------------------------- Vitals Details Patient Name: WBenedetto GoadDate of Service: 01/18/2017 8:00 AM Medical Record Patient Account Number: [id number]: Treating RN: PAhmed Prima3August 10, 1962(56y.o. Other Clinician: Date of Birth/Sex: Female) Treating ROBSON, MICHAEL Primary Care Laci Frenkel: BLavera GuiseProvider/Extender: [name] Referring Jansel Vonstein: BSallee Langein Treatment: 17 Vital Signs Time Taken: 08:22 Temperature (F): 98.1 Height (in): 63 Pulse (bpm): 86 Weight (lbs): 257 Respiratory Rate (breaths/min): 18 Body Mass Index (BMI): 45.5 Blood Pressure (mmHg): 112/69 Reference Range: 80 - 120 mg / dl Electronic Signature(s) Signed: 01/19/2017 3:37:28 PM By: PAlric QuanEntered By: PAlric Quanon 01/18/2017 08:23:57

## 2017-01-20 NOTE — Progress Notes (Signed)
JANCIE, KERCHER ([id number]) Visit Report for 01/18/2017 Chief Complaint Document Details Patient Name: [name] Date of Service: 01/18/2017 8:00 AM Medical Record Patient Account Number: [id number] Number: Treating RN: Phillis Haggis 1961-03-08 (55 y.o. Other Clinician: Date of Birth/Sex: Female) Treating ROBSON, MICHAEL Primary Care Provider: Joen [name] Provider/Extender: G Referring Provider: Tedra Senegal in Treatment: 17 Information Obtained from: Patient Chief Complaint Ms. Coy presents today for routine managament of bilateral ischial pressure ulcers and sinus tract to left popliteal fossa. Electronic Signature(s) Signed: 01/18/2017 6:11:14 PM By: Baltazar Najjar MD Entered By: Baltazar Najjar on 01/18/2017 10:43:03 Hislop, [name] ([id number]) -------------------------------------------------------------------------------- Debridement Details Patient Name: [name] Date of Service: 01/18/2017 8:00 AM Medical Record Patient Account Number: [id number] Number: Treating RN: Phillis Haggis [birth year] (55 y.o. Other Clinician: Date of Birth/Sex: Female) Treating ROBSON, MICHAEL Primary Care Provider: Joen [name] Provider/Extender: G Referring Provider: Tedra Senegal in Treatment: 17 Debridement Performed for Wound #3 Right Gluteal fold Assessment: Performed By: Physician Maxwell Caul, MD Debridement: Debridement Pre-procedure Yes - 08:32 Verification/Time Out Taken: Start Time: 08:33 Pain Control: Lidocaine 4% Topical Solution Level: Skin/Subcutaneous Tissue Total Area Debrided (L x 1 (cm) x 0.5 (cm) = 0.5 (cm) W): Tissue and other Viable, Non-Viable, Exudate, Fibrin/Slough, Subcutaneous material debrided: Instrument: Other : scoop Bleeding: Minimum Hemostasis Achieved: Pressure End Time: 08:36 Procedural Pain: 0 Post Procedural Pain: 0 Response to Treatment: Procedure was tolerated well Post Debridement  Measurements of Total Wound Length: (cm) 1 Stage: Category/Stage IV Width: (cm) 0.5 Depth: (cm) 6.7 Volume: (cm) 2.631 Character of Wound/Ulcer Post Requires Further Debridement: Debridement Severity of Tissue Post Fat layer exposed Debridement: Post Procedure Diagnosis Same as Pre-procedure Electronic Signature(s) Signed: 01/18/2017 6:11:14 PM By: Baltazar Najjar MD Dern, [name] ([id number]) Signed: 01/19/2017 3:37:28 PM By: Alejandro Mulling Entered By: Baltazar Najjar on 01/18/2017 10:42:47 Fallas, [name] ([id number]) -------------------------------------------------------------------------------- HPI Details Patient Name: [name] Date of Service: 01/18/2017 8:00 AM Medical Record Patient Account Number: [id number] Number: Treating RN: Phillis Haggis 1961/08/28 (55 y.o. Other Clinician: Date of Birth/Sex: Female) Treating ROBSON, MICHAEL Primary Care Provider: Joen [name] Provider/Extender: G Referring Provider: Tedra Senegal in Treatment: 17 History of Present Illness Location: bilateral gluteal ulcerations and left popliteal ulceration with tunneling Quality: adits to intermittent aching to ischial ulcers, no discomfort to sinus tract Severity: right iscial ulcer with increased depth Duration: chronic ulcers to bilateral ischial ulcers and sinus tract Timing: The pain is intermittent in severity as far as how intense it becomes but is present all the time. Manipulationn makes this worse. Context: The wound occurred when the patient had a fall and was unconscious for about 48 hours laying on the floor and she had pressure injury at that stage. Modifying Factors: Other treatment(s) tried include:as noted below she has been seen by visiting wound care physicians or nurse practitioners and details have been noted Associated Signs and Symptoms: has yet to receive offloading cushions and mattress overlay HPI Description: 56 year old patient who was  seen by visiting [name] wound care specialist for a wound on both her buttock and was found to have an unstageable wound on the right buttock for about 2 months. I understand that she had a fall and was laying on the floor for about 48 hours before she was found and taken to the ICU and had a long injury to her gluteal area from pressure and also had broken her right humerus. She has had a right proximal humerus  fracture and has been followed up with orthopedics recently. The patient has a past medical history of type 2 diabetes mellitus, paraparesis, acute pyelonephritis, GERD, hypertension, glaucoma, chronic pain, anxiety neurosis, nicotine dependence, COPD. the patient had some debridement done and was to operative was recommended to use Silvadene dressing and offloading. She is a smoker and occasionally smokes a few cigarettes. the patient requested a second opinion for months and is here to discuss her care. 09/21/16; the patient re-presents from home today for review of 3 different wounds. I note that she was seen in the clinic here in July at which time she had bilateral buttock wounds. It was apparently suggested at that time that she use a wound VAC bridged to both wounds just near the initial tuberosity's bilaterally which she refused. The history was a bit difficult to put together. Apparently this patient became ill at the end of April of this year. She was found sitting on the floor she had apparently been for 2 days and subsequently admitted to hospital from 04/23/16 through 05/02/16 and at that point she was critically ill ultimately having sepsis secondary to UTI, nontraumatic rhabdomyolysis and diabetic ketoacidosis. She had acute renal failure and I think required ICU care including intubation. Patient states her wounds actually started at that point on the bilateral issue tuberosities however in reviewing the discharge summary from 5/8 I see no reference to wounds at that point. It  did state that she had left lower extremity cellulitis however. Reviewing Epic I see no relevant x-rays. It would appear that her discharge creatinine was within the normal range and indeed on 9/15 her creatinine has remained normal. [facility] BRYNA, RAZAVI. ([id number]) rehabilitation. There the wounds on her bilateral Botox were dressed. Only just before her discharge from the nursing facility she developed an "knot" which was interpreted as cellulitis on the posterior aspect of her left knee she was given antibiotics. Apparently sometime late in July a this actually opened and became a wound at home health care was tending to however she is still having purulent drainage coming from this and by my understanding the wound depth is actually become unmeasurable. I am not really clear about what home health has been placing in any of these wound areas. The patient states that is something with silver and it. She is not been systemically unwell no fever or chills her appetite is good. She is a diabetic poorly controlled however she states that her recent blood sugars at home have been in the low to mid 100s. 09/28/16 On evaluation today patient appears to continue to exhibit the 3 areas of ulceration that were noted previous. She did have an x-ray of the right pelvis which showed evidence of potential soft tissue infection but no obvious osteomyelitis. There was a discussion last office visit concerning the possibility of a wound VAC. Witth that being said the x-ray report suggested that an MRI may be more appropriate to further evaluate the extent. Subsequently in regard to the wound over the popliteal portion of the left lower extremity with tunneling at 12:00 the CT scan that was ordered was denied by insurance as they state the patient has not had x-rays prior to advanced imaging. Patient states that she is frustrated with the  situation overall. 10/05/16 in the interval since I last saw this patient last week she has had the x-ray of the knee performed. I did review that x-ray today and fortunately  shows no evidence of osteomyelitis or other acute abnormality at this point in time. She continues to have the opening iin the posterior oral popliteal space with tracking proximal up the posterior thigh. Nothing seems to have worsened but it also seems to have not improved. The same is true in regard to the right pressure ulcer over the gluteal region which extends toward the ischium. The left gluteal pressure ulcer actually appears to be doing somewhat better my opinion there is some necrotic slough but overall this appears fairly well. She tells me thatt she has some discomfort especially when home health is helping her with dressing changes as they do not know her. At worse she rates her pain to be a 5 out of 10 right now it's more like a 1 out of 10. 10/07/16; still the patient has 3 different wound areas. She has a deep stage IV wound over her right ischial tuberosity. She is due to have an MRI next week. The wound over her left ischial tuberosity is more superficial and underwent debridement today. Finally she has a small open area in her left popliteal fossa the probes on measurably forward superiorly. Still a lot of drainage coming out of this. The CT scan that I ordered 3 weeks ago was questioned by her insurance company wanting a plain x-ray first. As I understand things result of this is nothing has been done in 3 weeks in terms of imaging the thigh and she has an MRI booked of this along with her pelvis for next week line 10/12/16; patient has a deep probing wound over the right ischiall tuberosity, stage III wound over the left visual tuberosity and a draining sinus in her left popliteal fossa. None of this much different from when I saw this 3 weeks ago. We have been using silver rope to the right ischial  wound and a draining area in the left popliteal fossa. Plain silver alginate to the area on the left ischial tuberosity 10/19/16; the patient's wounds are essentially unchanged although the area on the left lower gluteal is actually improved. Our intake nurse noted drainage from the right initial tuberosity probing wound as well as the draining area in the left popliteal fossa. Both of these were cultured. She had x-rays I think at the insistence of her insurance company on 09/23/16 x-ray of the pelvis was not particularly helpful she did have soft tissue air over the right lower pelvis although with the depth of this wound this is not surprising. An x-ray of her left knee did not show any specific abnormalities. We are still using silver alginate to these wound areas. Her MRI is booked for 10/27 10/26/16; cultures of the purulent drainage in her right initial tuberosity wound grew moderate Proteus and few staph aureus. The same organisms were cultured out of the left knee sinus tract posteriorly. The staph aureus is MRSA. I had started her on Augmentin last week I added doxycycline. The MRI of the left lower extremity and pelvis was finally done. The MRI of the femur showed subcutaneous soft tissue swelling Dobler, Shawniece L. ([id number]) edema fluid and myositis in the vastus lateralis muscle but no soft tissue abscess septic arthritis or osteomyelitis. MRI of the pelvis showed the left wound to be more expensive extending down to the bone there was osteomyelitis. Left hamstring tendons were also involved. No septic arthritis involving the hip. The decubitus ulcer on the right side showed no definite osteomyelitis or abscess.. The right hip wound is  actually the one the probes 6 cm downward. But the MRI showing infection including osteomyelitis on the left explains the draining sinus in the popliteal fossa on the left. She did have antibiotics in the hospitalization last time and this extended into  her nursing home stay but I'm not exactly sure what antibiotics and for what duration. According the patient this did include vancomycin with considerable effort of our staff we are able to get the patient into see Dr. Sampson Goon today. There were transportation difficulties. Her mother had open heart surgery and is in the ICU in [place] therefore her brother was unable to transport. Dr. Jarrett Ables office graciously arranged time to see her today. From my point of view she is going to require IV vancomycin plus perhaps a third generation cephalosporin. I plan to keep her on doxycycline and Augmentin until the IV antibiotics can be arranged. 11/02/2016 - Johnnette presents today for management of ulcers; She saw Dr. 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 She is to have the PICC line placed today and receive the initial dose for both antibiotics today. She has yet to receive the offloading chair cushion and/or mattress overlay from home health, apparently this has been a 3 week process. I encouraged her to speak to home health regarding this matter, along with offering home health to contact the wouns care center with any questions or concerns. The left ischial pressure ulcer continues to imporve, while to right ischial ulcer has increased in depth. The popliteal fossa sinus tract remains unmeasurable due to the limitation of depth measurement (tract extends beyond our measuring devices). 11-16-2016 Ms. Tuohey presents today for evaluation and management of bilateral ischial stage IV pressure ulcers and sinus tract to the left popliteal fossa. she is under the care of Dr. Sampson Goon for IV antibiotic therapy; she states that the vancomycin was placed on hold and will be restarted at a lower dose based on her renal function. She continues taking Zosyn in addition to the vancomycin. She also states that  she has yet to receive offloading cushions from home health, according to her she does not qualify for these offloading cushions because "the ulcers are unstageable ". We will contact the home health agency today to lend clarity regarding her pressure ulcers. The left popliteal fossa sinus tract continues to be a measurable as it extends beyond the length of our measuring devices.. 11/30/16; the patient is still on vancomycin and Zosyn. The depth of the draining sinus behind her left popliteal fossa is down to 4 cm although there is still serosanguineous drainage coming out of this. She saw Dr. Sampson Goon of infectious disease yesterday the idea is to weeks more of IV antibiotics and then oral antibiotics although I have not read his note. The area on the left gluteal fold is just about healed. She has a 6 cm draining sinus over the right initial tuberosity although I cannot feel bone at the base of this. As far as the patient is aware she has not had a recheck of her inflammatory markers. 12/07/16; patient is on vancomycin and Zosyn appointment with Dr. Sampson Goon on the 19th at which point the patient expects to have a change in antibiotics. Remarkable improvement over the wound over the left ischial tuberosity which is just about closed. The draining sinus in her popliteal fossa has 0.4 cm in depth. The area on the right ischial tuberosity still probes down 7 cm.  This is closed and overall wound dimensions but not depth. 12/14/16; patient is completing her vancomycin and Zosyn and per her she is going to transition to Bactrim and Augmentin for another 3 weeks. The area on her left gluteal fold is closed except for some skin tears. The area behind her left knee is no longer has any depth. The only remaining area that is of clinical concern is on the right gluteal fold probing towards the right ischial tuberosity. Today this measures 6.9 cm in depth. Very gritty surface 12/21/16; patient is now  on Bactrim and Augmentin as directed by infectious disease. This should be for another 2 weeks. The area in her left gluteal fold and left popliteal are closed over and fully healed. Duran, Oma L. ([id number]) Measurements today at 7 cm in the right buttock wound is unchanged from last week. 12/27/16; patient is on Bactrim and Augmentin for another week as directed by infectious disease. She has completed her IV antibiotics. She is not been systemically unwell no fever no chills. The area on her right buttock measured over 6 cm in depth. There is no palpable bone. No evidence of surrounding soft tissue infection. She is complaining of tongue irritation and has a history of thrush 01/04/17; patient is been back to see Dr. Sampson Goon, her Augmentin was stopped but he continued the Bactrim for another 3 weeks. Depth of the wound is 6.7 cm there is been no major change in either direction. She is not receive the wound VAC from home health I think because of confusion about who is supposed to provided will actually talk to the [facility] agency [facility] The net no major change. 01/18/17; patient obtained her wound VAC about 10 days ago however for some reason it was not actually put on the wound. She is therefore here for [place] to apply this I guess. No other issues are noted. She is not complaining of pain fever drainage Electronic Signature(s) Signed: 01/18/2017 6:11:14 PM By: Baltazar Najjar MD Entered By: Baltazar Najjar on 01/18/2017 10:47:08 Fillion, [name] ([id number]) -------------------------------------------------------------------------------- Physical Exam Details Patient Name: [name] Date of Service: 01/18/2017 8:00 AM Medical Record Patient Account Number: [id number] Number: Treating RN: Phillis Haggis [birth year] (55 y.o. Other Clinician: Date of Birth/Sex: Female) Treating ROBSON, MICHAEL Primary Care Provider: Joen [name] Provider/Extender: G Referring  Provider: Tedra Senegal in Treatment: 17 Constitutional Sitting or standing Blood Pressure is within target range for patient.. Pulse regular and within target range for patient.Marland Kitchen Respirations regular, non-labored and within target range.. Temperature is normal and within the target range for the patient.. Patient is in no apparent distress. Eyes Conjunctivae clear. No discharge.Marland Kitchen Respiratory Respiratory effort is easy and symmetric bilaterally. Rate is normal at rest and on room air.. Musculoskeletal No overt problems with the left knee. Integumentary (Hair, Skin) Other than scar tissue in the left gluteal and popliteal fossa no other issue. Notes 01/18/17; the wound exam on the left gluteal still remains closed although there is a lot of scar tissue. Similarly an area on her left posterior knee remains closed but some scar tissue. I have recommended lubrication of these areas. The area on her right gluteal still has about 6.7 cm in depth there is no surrounding soft tissue crepitus or evidence of infection no purulent drainage. Using an open curet I debrided the surface of the wounds of copious amounts of nonviable tissue in order to give the wound VAC the best opportunity to get this wound  close in. Electronic Signature(s) Signed: 01/18/2017 6:11:14 PM By: Baltazar Najjar MD Entered By: Baltazar Najjar on 01/18/2017 10:49:27 Peek, [name] ([id number]) -------------------------------------------------------------------------------- Physician Orders Details Patient Name: [name] Date of Service: 01/18/2017 8:00 AM Medical Record Patient Account Number: [id number] Number: Treating RN: Phillis Haggis 1961-06-21 (55 y.o. Other Clinician: Date of Birth/Sex: Female) Treating ROBSON, MICHAEL Primary Care Provider: Joen [name] Provider/Extender: G Referring Provider: Tedra Senegal in Treatment: 85 Verbal / Phone Orders: Yes Clinician: Ashok Cordia, Debi Read  Back and Verified: Yes Diagnosis Coding Wound Cleansing Wound #3 Right Gluteal fold o Clean wound with Normal Saline. o Cleanse wound with mild soap and water Anesthetic Wound #3 Right Gluteal fold o Topical Lidocaine 4% cream applied to wound bed prior to debridement - clinic use only Skin Barriers/Peri-Wound Care Wound #3 Right Gluteal fold o Skin Prep Dressing Change Frequency Wound #3 Right Gluteal fold o Three times weekly - HHRN to change MONDAY, and FRIDAY and pt to come to wound clinic on [facility], every other week. Follow-up Appointments Wound #3 Right Gluteal fold o Return Appointment in 2 weeks. Off-Loading Wound #3 Right Gluteal fold o Roho cushion for wheelchair - *********[facility] to order*********** Home Health Wound #3 Right Gluteal fold o Continue Home Health Visits - [facility] *****Please order ROHO cushion****** o Home Health Nurse may visit PRN to address patientos wound care needs. o FACE TO FACE ENCOUNTER: MEDICARE and MEDICAID PATIENTS: I certify that this patient is under my care and that I had a face-to-face encounter that meets the physician face-to-face Swails, ALICE ROOP. ([id number]) encounter requirements with this patient on this date. The encounter with the patient was in whole or in part for the following MEDICAL CONDITION: (primary reason for Home Healthcare) MEDICAL NECESSITY: I certify, that based on my findings, NURSING services are a medically necessary home health service. HOME BOUND STATUS: I certify that my clinical findings support that this patient is homebound (i.e., Due to illness or injury, pt requires aid of supportive devices such as crutches, cane, wheelchairs, walkers, the use of special transportation or the assistance of another person to leave their place of residence. There is a normal inability to leave the home and doing so requires considerable and taxing effort. Other absences are for  medical reasons / religious services and are infrequent or of short duration when for other reasons). o If current dressing causes regression in wound condition, may D/C ordered dressing product/s and apply Normal Saline Moist Dressing daily until next Wound Healing Center / Other MD appointment. Notify Wound Healing Center of regression in wound condition at [phone]. o Please direct any NON-WOUND related issues/requests for orders to patient's Primary Care Physician Negative Pressure Wound Therapy Wound #3 Right Gluteal fold o Wound VAC settings at 125/130 mmHg continuous pressure. Use BLACK/GREEN foam to wound cavity. Use WHITE foam to fill any tunnel/s and/or undermining. Change VAC dressing 3 X WEEK. Change canister as indicated when full. Nurse may titrate settings and frequency of dressing changes as clinically indicated. - Place white foam into wound in long strip and place where you can pull the white foam out. (WHITE FOAM ONLY IN THE WOUND) USE BLACK FOAM TO BRIDGE..... Please drape wound as according. Start now please if possible. o Home Health Nurse may d/c VAC for s/s of increased infection, significant wound regression, or uncontrolled drainage. Notify Wound Healing Center at [phone]. o Number of foam/gauze pieces used in the dressing = Medications-please add to  medication list. Wound #3 Right Gluteal fold o P.O. Antibiotics - Bactrim and Augmentin o Other: - Vitamin C, Zinc, Multivitamins IV antibiotics Electronic Signature(s) Signed: 01/18/2017 6:11:14 PM By: Baltazar Najjar MD Signed: 01/19/2017 3:37:28 PM By: Alejandro Mulling Entered By: Alejandro Mulling on 01/18/2017 09:37:54 Pilley, [name] ([id number]) -------------------------------------------------------------------------------- Problem List Details Patient Name: [name] Date of Service: 01/18/2017 8:00 AM Medical Record Patient Account Number: [id number] Number: Treating  RN: Phillis Haggis 1961/06/27 (55 y.o. 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 09/21/2016 09/21/2016 severity Resolved Problems ICD-10 Code Description Active Date Resolved Date L89.323 Pressure ulcer of left buttock, stage 3 09/21/2016 09/21/2016 Electronic Signature(s) Signed: 01/18/2017 6:11:14 PM By: Baltazar Najjar MD Koren, [name] ([id number]) Entered By: Baltazar Najjar on 01/18/2017 10:42:11 Derrington, [name] ([id number]) -------------------------------------------------------------------------------- Progress Note Details Patient Name: [name] Date of Service: 01/18/2017 8:00 AM Medical Record Patient Account Number: [id number] Number: Treating RN: Phillis Haggis November 27, 1961 (55 y.o. Other Clinician: Date of Birth/Sex: Female) Treating ROBSON, MICHAEL Primary Care Provider: Joen [name] Provider/Extender: G Referring Provider: Tedra Senegal in Treatment: 17 Subjective Chief Complaint Information obtained from Patient Ms. Caudillo presents today for routine managament of bilateral ischial pressure ulcers and sinus tract to left popliteal fossa. History of Present Illness (HPI) The following HPI elements were documented for the patient's wound: Location: bilateral gluteal ulcerations and left popliteal ulceration with tunneling Quality: adits to  intermittent aching to ischial ulcers, no discomfort to sinus tract Severity: right iscial ulcer with increased depth Duration: chronic ulcers to bilateral ischial ulcers and sinus tract Timing: The pain is intermittent in severity as far as how intense it becomes but is present all the time. Manipulationn makes this worse. Context: The wound occurred when the patient had a fall and was unconscious for about 48 hours laying on the floor and she had pressure injury at that stage. Modifying Factors: Other treatment(s) tried include:as noted below she has been seen by visiting wound care physicians or nurse practitioners and details have been noted Associated Signs and Symptoms: has yet to receive offloading cushions and mattress overlay 56 year old patient who was seen by visiting [name] wound care specialist for a wound on both her buttock and was found to have an unstageable wound on the right buttock for about 2 months. I understand that she had a fall and was laying on the floor for about 48 hours before she was found and taken to the ICU and had a long injury to her gluteal area from pressure and also had broken her right humerus. She has had a right proximal humerus fracture and has been followed up with orthopedics recently. The patient has a past medical history of type 2 diabetes mellitus, paraparesis, acute pyelonephritis, GERD, hypertension, glaucoma, chronic pain, anxiety neurosis, nicotine dependence, COPD. the patient had some debridement done and was to operative was recommended to use Silvadene dressing and offloading. She is a smoker and occasionally smokes a few cigarettes. the patient requested a second opinion for months  and is here to discuss her care. 09/21/16; the patient re-presents from home today for review of 3 different wounds. I note that she was seen in the clinic here in July at which time she had bilateral buttock wounds. It was apparently suggested at that time  that she use a wound VAC bridged to both wounds just near the initial tuberosity's bilaterally which she refused. Racanelli, Eyonna L. ([id number]) The history was a bit difficult to put together. Apparently this patient became ill at the end of April of this year. She was found sitting on the floor she had apparently been for 2 days and subsequently admitted to hospital from 04/23/16 through 05/02/16 and at that point she was critically ill ultimately having sepsis secondary to UTI, nontraumatic rhabdomyolysis and diabetic ketoacidosis. She had acute renal failure and I think required ICU care including intubation. Patient states her wounds actually started at that point on the bilateral issue tuberosities however in reviewing the discharge summary from 5/8 I see no reference to wounds at that point. It did state that she had left lower extremity cellulitis however. Reviewing Epic I see no relevant x-rays. It would appear that her discharge creatinine was within the normal range and indeed on 9/15 her creatinine has remained normal. [facility] There the wounds on her bilateral Botox were dressed. Only just before her discharge from the nursing facility she developed an "knot" which was interpreted as cellulitis on the posterior aspect of her left knee she was given antibiotics. Apparently sometime late in July a this actually opened and became a wound at home health care was tending to however she is still having purulent drainage coming from this and by my understanding the wound depth is actually become unmeasurable. I am not really clear about what home health has been placing in any of these wound areas. The patient states that is something with silver and it. She is not been systemically unwell no fever or chills her appetite is good. She is a diabetic poorly controlled however she states that her recent blood sugars at home have been in the  low to mid 100s. 09/28/16 On evaluation today patient appears to continue to exhibit the 3 areas of ulceration that were noted previous. She did have an x-ray of the right pelvis which showed evidence of potential soft tissue infection but no obvious osteomyelitis. There was a discussion last office visit concerning the possibility of a wound VAC. Witth that being said the x-ray report suggested that an MRI may be more appropriate to further evaluate the extent. Subsequently in regard to the wound over the popliteal portion of the left lower extremity with tunneling at 12:00 the CT scan that was ordered was denied by insurance as they state the patient has not had x-rays prior to advanced imaging. Patient states that she is frustrated with the situation overall. 10/05/16 in the interval since I last saw this patient last week she has had the x-ray of the knee performed. I did review that x-ray today and fortunately shows no evidence of osteomyelitis or other acute abnormality at this point in time. She continues to have the opening iin the posterior oral popliteal space with tracking proximal up the posterior thigh. Nothing seems to have worsened but it also seems to have not improved. The same is true in regard to the right pressure ulcer over the gluteal region which extends toward the ischium. The left gluteal pressure  ulcer actually appears to be doing somewhat better my opinion there is some necrotic slough but overall this appears fairly well. She tells me thatt she has some discomfort especially when home health is helping her with dressing changes as they do not know her. At worse she rates her pain to be a 5 out of 10 right now it's more like a 1 out of 10. 10/07/16; still the patient has 3 different wound areas. She has a deep stage IV wound over her right ischial tuberosity. She is due to have an MRI next week. The wound over her left ischial tuberosity is more superficial and  underwent debridement today. Finally she has a small open area in her left popliteal fossa the probes on measurably forward superiorly. Still a lot of drainage coming out of this. The CT scan that I ordered 3 weeks ago was questioned by her insurance company wanting a plain x-ray first. As I understand things result of this is nothing has been done in 3 weeks in terms of imaging the thigh and she has an MRI booked of this along with her pelvis for next week line 10/12/16; patient has a deep probing wound over the right ischiall tuberosity, stage III wound over the left visual tuberosity and a draining sinus in her left popliteal fossa. None of this much different from when I saw this 3 weeks ago. We have been using silver rope to the right ischial wound and a draining area in the left popliteal fossa. Plain silver alginate to the area on the left ischial tuberosity 10/19/16; the patient's wounds are essentially unchanged although the area on the left lower gluteal is actually improved. Our intake nurse noted drainage from the right initial tuberosity probing wound as well as Rady, Isola L. ([id number]) the draining area in the left popliteal fossa. Both of these were cultured. She had x-rays I think at the insistence of her insurance company on 09/23/16 x-ray of the pelvis was not particularly helpful she did have soft tissue air over the right lower pelvis although with the depth of this wound this is not surprising. An x-ray of her left knee did not show any specific abnormalities. We are still using silver alginate to these wound areas. Her MRI is booked for 10/27 10/26/16; cultures of the purulent drainage in her right initial tuberosity wound grew moderate Proteus and few staph aureus. The same organisms were cultured out of the left knee sinus tract posteriorly. The staph aureus is MRSA. I had started her on Augmentin last week I added doxycycline. The MRI of the left lower extremity and  pelvis was finally done. The MRI of the femur showed subcutaneous soft tissue swelling edema fluid and myositis in the vastus lateralis muscle but no soft tissue abscess septic arthritis or osteomyelitis. MRI of the pelvis showed the left wound to be more expensive extending down to the bone there was osteomyelitis. Left hamstring tendons were also involved. No septic arthritis involving the hip. The decubitus ulcer on the right side showed no definite osteomyelitis or abscess.. The right hip wound is actually the one the probes 6 cm downward. But the MRI showing infection including osteomyelitis on the left explains the draining sinus in the popliteal fossa on the left. She did have antibiotics in the hospitalization last time and this extended into her nursing home stay but I'm not exactly sure what antibiotics and for what duration. According the patient this did include vancomycin with considerable effort of our  staff we are able to get the patient into see Dr. Sampson Goon today. There were transportation difficulties. Her mother had open heart surgery and is in the ICU in [place] therefore her brother was unable to transport. Dr. Jarrett Ables office graciously arranged time to see her today. From my point of view she is going to require IV vancomycin plus perhaps a third generation cephalosporin. I plan to keep her on doxycycline and Augmentin until the IV antibiotics can be arranged. 11/02/2016 - Reeva presents today for management of ulcers; She saw Dr. 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 She is to have the PICC line placed today and receive the initial dose for both antibiotics today. She has yet to receive the offloading chair cushion and/or mattress overlay from home health, apparently this has been a 3 week process. I encouraged her to speak to home health regarding this matter, along  with offering home health to contact the wouns care center with any questions or concerns. The left ischial pressure ulcer continues to imporve, while to right ischial ulcer has increased in depth. The popliteal fossa sinus tract remains unmeasurable due to the limitation of depth measurement (tract extends beyond our measuring devices). 11-16-2016 Ms. Blystone presents today for evaluation and management of bilateral ischial stage IV pressure ulcers and sinus tract to the left popliteal fossa. she is under the care of Dr. Sampson Goon for IV antibiotic therapy; she states that the vancomycin was placed on hold and will be restarted at a lower dose based on her renal function. She continues taking Zosyn in addition to the vancomycin. She also states that she has yet to receive offloading cushions from home health, according to her she does not qualify for these offloading cushions because "the ulcers are unstageable ". We will contact the home health agency today to lend clarity regarding her pressure ulcers. The left popliteal fossa sinus tract continues to be a measurable as it extends beyond the length of our measuring devices.. 11/30/16; the patient is still on vancomycin and Zosyn. The depth of the draining sinus behind her left popliteal fossa is down to 4 cm although there is still serosanguineous drainage coming out of this. She saw Dr. Sampson Goon of infectious disease yesterday the idea is to weeks more of IV antibiotics and then oral antibiotics although I have not read his note. The area on the left gluteal fold is just about healed. She has a 6 cm draining sinus over the right initial tuberosity although I cannot feel bone at the base of this. As far as the patient is aware she has not had a recheck of her inflammatory markers. 12/07/16; patient is on vancomycin and Zosyn appointment with Dr. Sampson Goon on the 19th at which point the patient expects to have a change in antibiotics.  Remarkable improvement over the wound over the left ischial tuberosity which is just about closed. The draining sinus in her popliteal fossa has 0.4 cm in depth. Otte, Emina L. ([id number]) The area on the right ischial tuberosity still probes down 7 cm. This is closed and overall wound dimensions but not depth. 12/14/16; patient is completing her vancomycin and Zosyn and per her she is going to transition to Bactrim and Augmentin for another 3 weeks. The area on her left gluteal fold is closed except for some skin tears. The area behind her left knee is no longer has any depth. The only remaining area that  is of clinical concern is on the right gluteal fold probing towards the right ischial tuberosity. Today this measures 6.9 cm in depth. Very gritty surface 12/21/16; patient is now on Bactrim and Augmentin as directed by infectious disease. This should be for another 2 weeks. The area in her left gluteal fold and left popliteal are closed over and fully healed. Measurements today at 7 cm in the right buttock wound is unchanged from last week. 12/27/16; patient is on Bactrim and Augmentin for another week as directed by infectious disease. She has completed her IV antibiotics. She is not been systemically unwell no fever no chills. The area on her right buttock measured over 6 cm in depth. There is no palpable bone. No evidence of surrounding soft tissue infection. She is complaining of tongue irritation and has a history of thrush 01/04/17; patient is been back to see Dr. Sampson Goon, her Augmentin was stopped but he continued the Bactrim for another 3 weeks. Depth of the wound is 6.7 cm there is been no major change in either direction. She is not receive the wound VAC from home health I think because of confusion about who is supposed to provided will actually talk to the [facility] agency [facility] The net no major change. 01/18/17; patient obtained her wound VAC about 10 days ago however  for some reason it was not actually put on the wound. She is therefore here for [place] to apply this I guess. No other issues are noted. She is not complaining of pain fever drainage Objective Constitutional Sitting or standing Blood Pressure is within target range for patient.. Pulse regular and within target range for patient.Marland Kitchen Respirations regular, non-labored and within target range.. Temperature is normal and within the target range for the patient.. Patient is in no apparent distress. Vitals Time Taken: 8:22 AM, Height: 63 in, Weight: 257 lbs, BMI: 45.5, Temperature: 98.1 F, Pulse: 86 bpm, Respiratory Rate: 18 breaths/min, Blood Pressure: 112/69 mmHg. Eyes Conjunctivae clear. No discharge.Marland Kitchen Respiratory Respiratory effort is easy and symmetric bilaterally. Rate is normal at rest and on room air.. Musculoskeletal No overt problems with the left knee. General Notes: 01/18/17; the wound exam on the left gluteal still remains closed although there is a lot of scar tissue. Similarly an area on her left posterior knee remains closed but some scar tissue. I have Zahniser, Marijo L. ([id number]) recommended lubrication of these areas. The area on her right gluteal still has about 6.7 cm in depth there is no surrounding soft tissue crepitus or evidence of infection no purulent drainage. Using an open curet I debrided the surface of the wounds of copious amounts of nonviable tissue in order to give the wound VAC the best opportunity to get this wound close in. Integumentary (Hair, Skin) Other than scar tissue in the left gluteal and popliteal fossa no other issue. Wound #3 status is Open. Original cause of wound was Pressure Injury. The wound is located on the Right Gluteal fold. The wound measures 1cm length x 0.5cm width x 6.7cm depth; 0.393cm^2 area and 2.631cm^3 volume. There is Fat Layer (Subcutaneous Tissue) Exposed exposed. There is no tunneling or undermining noted. There is a large amount of  serous drainage noted. The wound margin is distinct with the outline attached to the wound base. There is medium (34-66%) granulation within the wound bed. There is a medium (34-66%) amount of necrotic tissue within the wound bed. The periwound skin appearance exhibited: Scarring, Maceration. The periwound skin appearance did not  exhibit: Callus, Crepitus, Excoriation, Induration, Rash, Dry/Scaly, Atrophie Blanche, Cyanosis, Ecchymosis, Hemosiderin Staining, Mottled, Pallor, Rubor, Erythema. Periwound temperature was noted as No Abnormality. The periwound has tenderness on palpation. 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 There was a Skin/Subcutaneous Tissue Debridement ([id number]) debridement with total area of 0.5 sq cm performed by Maxwell Caul, MD. with the following instrument(s): scoop to remove Viable and Non-Viable tissue/material including Exudate, Fibrin/Slough, and Subcutaneous after achieving pain control using Lidocaine 4% Topical Solution. A time out was conducted at 08:32, prior to the start of the procedure. A Minimum amount of bleeding was controlled with Pressure. The procedure was tolerated well with a pain level of 0 throughout and a pain level of 0 following the procedure. Post Debridement Measurements: 1cm length x 0.5cm width x 6.7cm depth; 2.631cm^3 volume. Post debridement Stage noted as Category/Stage IV. Character of Wound/Ulcer Post Debridement requires further debridement. Severity of Tissue Post Debridement is: Fat layer exposed. Post procedure Diagnosis Wound #3: Same as Pre-Procedure Gerding, Argie L. ([id number]) Plan Wound Cleansing: Wound #3 Right Gluteal fold: Clean wound with Normal Saline. Cleanse wound with  mild soap and water Anesthetic: Wound #3 Right Gluteal fold: Topical Lidocaine 4% cream applied to wound bed prior to debridement - clinic use only Skin Barriers/Peri-Wound Care: Wound #3 Right Gluteal fold: Skin Prep Dressing Change Frequency: Wound #3 Right Gluteal fold: Three times weekly - HHRN to change MONDAY, and FRIDAY and pt to come to wound clinic on [facility], every other week. Follow-up Appointments: Wound #3 Right Gluteal fold: Return Appointment in 2 weeks. Off-Loading: Wound #3 Right Gluteal fold: Roho cushion for wheelchair - *********[facility] to order*********** Home Health: Wound #3 Right Gluteal fold: Continue Home Health Visits - [facility] *****Please order [facility] cushion****** Home Health Nurse may visit PRN to address patient s wound care needs. FACE TO FACE ENCOUNTER: MEDICARE and MEDICAID PATIENTS: I certify that this patient is under my care and that I had a face-to-face encounter that meets the physician face-to-face encounter requirements with this patient on this date. The encounter with the patient was in whole or in part for the following MEDICAL CONDITION: (primary reason for Home Healthcare) MEDICAL NECESSITY: I certify, that based on my findings, NURSING services are a medically necessary home health service. HOME BOUND STATUS: I certify that my clinical findings support that this patient is homebound (i.e., Due to illness or injury, pt requires aid of supportive devices such as crutches, cane, wheelchairs, walkers, the use of special transportation or the assistance of another person to leave their place of residence. There is a normal inability to leave the home and doing so requires considerable and taxing effort. Other absences are for medical reasons / religious services and are infrequent or of short duration when for other reasons). If current dressing causes regression in wound condition, may D/C ordered dressing product/s  and apply Normal Saline Moist Dressing daily until next Wound Healing Center / Other MD appointment. Notify Wound Healing Center of regression in wound condition at [phone]. Please direct any NON-WOUND related issues/requests for orders to patient's Primary Care Physician Negative Pressure Wound Therapy: Wound #3 Right Gluteal fold: Wound VAC settings at 125/130 mmHg continuous pressure. Use BLACK/GREEN foam to wound cavity. Use WHITE foam  to fill any tunnel/s and/or undermining. Change VAC dressing 3 X WEEK. Change Polan, Aubria L. ([id number]) canister as indicated when full. Nurse may titrate settings and frequency of dressing changes as clinically indicated. - Place white foam into wound in long strip and place where you can pull the white foam out. (WHITE FOAM ONLY IN THE WOUND) USE BLACK FOAM TO BRIDGE..... Please drape wound as according. Start now please if possible. Home Health Nurse may d/c VAC for s/s of increased infection, significant wound regression, or uncontrolled drainage. Notify Wound Healing Center at [phone]. Number of foam/gauze pieces used in the dressing = Medications-please add to medication list.: Wound #3 Right Gluteal fold: P.O. Antibiotics - Bactrim and Augmentin Other: - Vitamin C, Zinc, Multivitamins IV antibiotics wound debrided woundvac spplied hopefully this will result in wound closure no overt evidence of continued infection Electronic Signature(s) Signed: 01/18/2017 6:11:14 PM By: Baltazar Najjar MD Entered By: Baltazar Najjar on 01/18/2017 10:50:38 Ovitt, [name] ([id number]) -------------------------------------------------------------------------------- SuperBill Details Patient Name: [name] Date of Service: 01/18/2017 Medical Record Patient Account Number: [id number] Number: Treating RN: Phillis Haggis [birth year] (55 y.o. Other Clinician: Date of Birth/Sex: Female) Treating ROBSON, MICHAEL Primary Care Provider:  Joen [name] Provider/Extender: G Referring Provider: Tedra [place] in Treatment: 17 Diagnosis Coding ICD-10 Codes Code Description L89.314 Pressure ulcer of right buttock, stage 4 M86.18 Other acute osteomyelitis, other site E11.42 Type 2 diabetes mellitus with diabetic polyneuropathy L89.324 Pressure ulcer of left buttock, stage 4 Facility Procedures CPT4 Code: [id number] Description: 11042 - DEB SUBQ TISSUE 20 SQ CM/< ICD-10 Description Diagnosis L89.324 Pressure ulcer of left buttock, stage 4 Modifier: Quantity: 1 Physician Procedures CPT4 Code: [id number] Description: 11042 - WC PHYS SUBQ TISS 20 SQ CM ICD-10 Description Diagnosis L89.324 Pressure ulcer of left buttock, stage 4 Modifier: Quantity: 1 Electronic Signature(s) Signed: 01/18/2017 6:11:14 PM By: Baltazar Najjar MD Entered By: Baltazar Najjar on 01/18/2017 10:51:53

## 2017-01-25 ENCOUNTER — Ambulatory Visit: Payer: Managed Care, Other (non HMO) | Admitting: Internal Medicine

## 2017-02-01 ENCOUNTER — Encounter: Payer: Managed Care, Other (non HMO) | Attending: Internal Medicine | Admitting: Internal Medicine

## 2017-02-01 DIAGNOSIS — F17210 Nicotine dependence, cigarettes, uncomplicated: Secondary | ICD-10-CM | POA: Insufficient documentation

## 2017-02-01 DIAGNOSIS — I10 Essential (primary) hypertension: Secondary | ICD-10-CM | POA: Insufficient documentation

## 2017-02-01 DIAGNOSIS — J449 Chronic obstructive pulmonary disease, unspecified: Secondary | ICD-10-CM | POA: Diagnosis not present

## 2017-02-01 DIAGNOSIS — K219 Gastro-esophageal reflux disease without esophagitis: Secondary | ICD-10-CM | POA: Insufficient documentation

## 2017-02-01 DIAGNOSIS — F411 Generalized anxiety disorder: Secondary | ICD-10-CM | POA: Diagnosis not present

## 2017-02-01 DIAGNOSIS — E1142 Type 2 diabetes mellitus with diabetic polyneuropathy: Secondary | ICD-10-CM | POA: Diagnosis not present

## 2017-02-01 DIAGNOSIS — L89314 Pressure ulcer of right buttock, stage 4: Secondary | ICD-10-CM | POA: Insufficient documentation

## 2017-02-02 NOTE — Progress Notes (Signed)
Katelyn, [name] ([id number]) Visit Report for 02/01/2017 Arrival Information Details Patient Name: [name], [name] Date of Service: 02/01/2017 8:00 AM Medical Record Patient Account Number: [id number] Number: Treating RN: Cornell Barman December 30, 1960 (56 y.o. Other Clinician: Date of Birth/Sex: Female) Treating ROBSON, MICHAEL Primary Care : Lavera Guise /Extender: G Referring : Sallee Lange in Treatment: 83 Visit Information History Since Last Visit Added or deleted any medications: No Patient Arrived: Ambulatory Any new allergies or adverse reactions: No Arrival Time: 08:07 Hospitalized since last visit: No Accompanied By: self Has Dressing in Place as Prescribed: Yes Transfer Assistance: None Pain Present Now: No Patient Identification Verified: Yes Secondary Verification Process Yes Completed: Patient Requires Transmission-Based No Precautions: Patient Has Alerts: Yes Patient Alerts: DM II Electronic Signature(s) Signed: 02/01/2017 5:04:45 PM By: Gretta Cool, RN, BSN, Kim RN, BSN Entered By: Gretta Cool, RN, BSN, Kim on 02/01/2017 08:08:15 [place], [name] ([id number]) -------------------------------------------------------------------------------- Clinic Level of Care Assessment Details Patient Name: [name] Date of Service: 02/01/2017 8:00 AM Medical Record Patient Account Number: [id number] Number: Treating RN: Cornell Barman 12-23-1961 (56 y.o. Other Clinician: Date of Birth/Sex: Female) Treating ROBSON, MICHAEL Primary Care : Lavera Guise /Extender: G Referring : Sallee Lange in Treatment: 19 Clinic Level of Care Assessment Items TOOL 1 Quantity Score [] - Use when EandM and Procedure is performed on INITIAL visit 0 ASSESSMENTS - Nursing Assessment / Reassessment [] - General Physical Exam (combine w/ comprehensive assessment (listed just 0 below) when performed on new pt. evals) [] - Comprehensive  Assessment (HX, ROS, Risk Assessments, Wounds Hx, etc.) 0 ASSESSMENTS - Wound and Skin Assessment / Reassessment [] - Dermatologic / Skin Assessment (not related to wound area) 0 ASSESSMENTS - Ostomy and/or Continence Assessment and Care [] - Incontinence Assessment and Management 0 [] - Ostomy Care Assessment and Management (repouching, etc.) 0 PROCESS - Coordination of Care [] - Simple Patient / Family Education for ongoing care 0 [] - Complex (extensive) Patient / Family Education for ongoing care 0 [] - Staff obtains Programmer, systems, Records, Test Results / Process Orders 0 [] - Staff telephones HHA, Nursing Homes / Clarify orders / etc 0 [] - Routine Transfer to another Facility (non-emergent condition) 0 [] - Routine Hospital Admission (non-emergent condition) 0 [] - New Admissions / Biomedical engineer / Ordering NPWT, Apligraf, etc. 0 [] - Emergency Hospital Admission (emergent condition) 0 PROCESS - Special Needs [] - Pediatric / Minor Patient Management 0 Duecker, Martita L. ([id number]) [] - Isolation Patient Management 0 [] - Hearing / Language / Visual special needs 0 [] - Assessment of Community assistance (transportation, D/C planning, etc.) 0 [] - Additional assistance / Altered mentation 0 [] - Support Surface(s) Assessment (bed, cushion, seat, etc.) 0 INTERVENTIONS - Miscellaneous [] - External ear exam 0 [] - Patient Transfer (multiple staff / Civil Service fast streamer / Similar devices) 0 [] - Simple Staple / Suture removal (25 or less) 0 [] - Complex Staple / Suture removal (26 or more) 0 [] - Hypo/Hyperglycemic Management (do not check if billed separately) 0 [] - Ankle / Brachial Index (ABI) - do not check if billed separately 0 Has the patient been seen at the hospital within the last three years: Yes Total Score: 0 Level Of Care: ____ Electronic Signature(s) Signed: 02/01/2017 5:04:45 PM By: Gretta Cool, RN, BSN, Kim RN, BSN Entered By: Gretta Cool, RN, BSN, Kim on 02/01/2017 08:49:57 Gotto,  Stamatia L. ([id number]) -------------------------------------------------------------------------------- Encounter Discharge Information Details Patient Name: [name], [name]  Date of Service: 02/01/2017 8:00 AM Medical Record Patient Account Number: [id number] Number: Treating RN: Cornell Barman 08/17/1961 (56 y.o. Other Clinician: Date of Birth/Sex: Female) Treating ROBSON, MICHAEL Primary Care Nirav Sweda: Lavera Guise Jamese Trauger/Extender: G Referring Macil Crady: Sallee Lange in Treatment: 18 Encounter Discharge Information Items Discharge Pain Level: 0 Discharge Condition: Stable Ambulatory Status: Wheelchair Discharge Destination: Home Transportation: Other Accompanied By: self Schedule Follow-up Appointment: Yes Medication Reconciliation completed Yes and provided to Patient/Care Rishon Thilges: Patient Clinical Summary of Care: Declined Electronic Signature(s) Signed: 02/01/2017 8:57:50 AM By: Ruthine Dose Entered By: Ruthine Dose on 02/01/2017 08:57:49 Reber, [name] ([id number]) -------------------------------------------------------------------------------- Lower Extremity Assessment Details Patient Name: [name] Date of Service: 02/01/2017 8:00 AM Medical Record Patient Account Number: [id number] Number: Treating RN: Cornell Barman 1961-08-30 (56 y.o. Other Clinician: Date of Birth/Sex: Female) Treating ROBSON, MICHAEL Primary Care Dmiyah Liscano: Lavera Guise Castor Gittleman/Extender: G Referring Marcedes Tech: Sallee Lange in Treatment: 45 Electronic Signature(s) Signed: 02/01/2017 5:04:45 PM By: Gretta Cool, RN, BSN, Kim RN, BSN Entered By: Gretta Cool, RN, BSN, Kim on 02/01/2017 08:09:08 [place], [name] ([id number]) -------------------------------------------------------------------------------- Multi Wound Chart Details Patient Name: [name] Date of Service: 02/01/2017 8:00 AM Medical Record Patient Account Number: [id number] Number: Treating RN: Cornell Barman [birth year] (56 y.o. Other Clinician: Date of Birth/Sex: Female) Treating ROBSON, MICHAEL Primary Care [name]: Lavera Guise Hiran Leard/Extender: G Referring Zionah Criswell: Sallee Lange in Treatment: 19 Vital Signs Height(in): 63 Pulse(bpm): 82 Weight(lbs): 257 Blood Pressure 104/60 (mmHg): Body Mass Index(BMI): 46 Temperature(F): 98.1 Respiratory Rate 16 (breaths/min): Photos: [3:No Photos] [N/A:N/A] Wound Location: [3:Right Gluteal fold] [N/A:N/A] Wounding Event: [3:Pressure Injury] [N/A:N/A] Primary Etiology: [3:Pressure Ulcer] [N/A:N/A] Comorbid History: [3:Asthma, Hypertension, Type II Diabetes, Neuropathy] [N/A:N/A] Date Acquired: [3:04/21/2016] [N/A:N/A] Weeks of Treatment: [3:19] [N/A:N/A] Wound Status: [3:Open] [N/A:N/A] Measurements L x W x D 0.8x0.4x6.2 [N/A:N/A] (cm) Area (cm) : [3:0.251] [N/A:N/A] Volume (cm) : [3:1.558] [N/A:N/A] % Reduction in Area: [3:91.80%] [N/A:N/A] % Reduction in Volume: 90.90% [N/A:N/A] Classification: [3:Category/Stage IV] [N/A:N/A] Exudate Amount: [3:Large] [N/A:N/A] Exudate Type: [3:Serous] [N/A:N/A] Exudate Color: [3:amber] [N/A:N/A] Foul Odor After [3:Yes] [N/A:N/A] Cleansing: Odor Anticipated Due to No [N/A:N/A] Product Use: Wound Margin: [3:Distinct, outline attached] [N/A:N/A] Granulation Amount: [3:None Present (0%)] [N/A:N/A] Necrotic Amount: [3:Medium (34-66%)] [N/A:N/A] Exposed Structures: [N/A:N/A] Fat Layer (Subcutaneous Tissue) Exposed: Yes Epithelialization: None N/A N/A Periwound Skin Texture: Scarring: Yes N/A N/A Excoriation: No Induration: No Callus: No Crepitus: No Rash: No Periwound Skin Maceration: Yes N/A N/A Moisture: Dry/Scaly: No Periwound Skin Color: Atrophie Blanche: No N/A N/A Cyanosis: No Ecchymosis: No Erythema: No Hemosiderin Staining: No Mottled: No Pallor: No Rubor: No Temperature: No Abnormality N/A N/A Tenderness on Yes N/A N/A Palpation: Wound Preparation: Ulcer  Cleansing: N/A N/A Rinsed/Irrigated with Saline Topical Anesthetic Applied: Other: lidocaine 4% Treatment Notes Electronic Signature(s) Signed: 02/01/2017 4:28:51 PM By: Linton Ham MD Entered By: Linton Ham on 02/01/2017 08:40:33 [place]avin, [name] ([id number]) -------------------------------------------------------------------------------- Multi-Disciplinary Care Plan Details Patient Name: [name] Date of Service: 02/01/2017 8:00 AM Medical Record Patient Account Number: [id number] Number: Treating RN: Cornell Barman 18-Jun-1961 (55 y.o. Other Clinician: Date of Birth/Sex: Female) Treating ROBSON, MICHAEL Primary Care Hani Campusano: Lavera Guise Tonji Elliff/Extender: G Referring Evelette Hollern: Sallee Lange in Treatment: 24 Active Inactive ` Abuse / Safety / Falls / Self Care Management Nursing Diagnoses: Potential for falls Goals: Patient will remain injury free Date Initiated: 09/21/2016 Target Resolution Date: 03/25/2017 Goal Status: Active Interventions: Assess fall risk on admission and as needed Notes: ` Nutrition Nursing Diagnoses: Imbalanced  nutrition Potential for alteratiion in Nutrition/Potential for imbalanced nutrition Goals: Patient/caregiver agrees to and verbalizes understanding of need to use nutritional supplements and/or vitamins as prescribed Date Initiated: 09/21/2016 Target Resolution Date: 03/25/2017 Goal Status: Active Patient/caregiver verbalizes understanding of need to maintain therapeutic glucose control per primary care physician Date Initiated: 09/21/2016 Target Resolution Date: 03/25/2017 Goal Status: Active Interventions: Assess patient nutrition upon admission and as needed per policy Benak, [place] ([id number]) Notes: ` Orientation to the Wound Care Program Nursing Diagnoses: Knowledge deficit related to the wound healing center program Goals: Patient/caregiver will verbalize understanding of the [place] Date Initiated: 09/21/2016 Target Resolution Date: 03/25/2017 Goal Status: Active Interventions: Provide education on orientation to the wound center Notes: ` Pain, Acute or Chronic Nursing Diagnoses: Pain, acute or chronic: actual or potential Potential alteration in comfort, pain Goals: Patient will verbalize adequate pain control and receive pain control interventions during procedures as needed Date Initiated: 09/21/2016 Target Resolution Date: 03/25/2017 Goal Status: Active Patient/caregiver will verbalize adequate pain control between visits Date Initiated: 09/21/2016 Target Resolution Date: 03/25/2017 Goal Status: Active Patient/caregiver will verbalize comfort level met Date Initiated: 09/21/2016 Target Resolution Date: 03/25/2017 Goal Status: Active Interventions: Assess comfort goal upon admission Complete pain assessment as per visit requirements Notes: ` Wound/Skin Impairment Nursing Diagnoses: MAXWELL, MARTORANO ([id number]) Impaired tissue integrity Goals: Ulcer/skin breakdown will have a volume reduction of 30% by week 4 Date Initiated: 09/21/2016 Target Resolution Date: 03/25/2017 Goal Status: Active Ulcer/skin breakdown will have a volume reduction of 50% by week 8 Date Initiated: 09/21/2016 Target Resolution Date: 03/25/2017 Goal Status: Active Ulcer/skin breakdown will have a volume reduction of 80% by week 12 Date Initiated: 09/21/2016 Target Resolution Date: 03/25/2017 Goal Status: Active Interventions: Assess ulceration(s) every visit Notes: Electronic Signature(s) Signed: 02/01/2017 5:04:45 PM By: Gretta Cool, RN, BSN, Kim RN, BSN Entered By: Gretta Cool, RN, BSN, Kim on 02/01/2017 08:29:38 [place], Flasher ([id number]) -------------------------------------------------------------------------------- Negative Pressure Wound Therapy Application (NPWT) Details Patient Name: [name] Date of Service: 02/01/2017 8:00 AM Medical Record Patient Account Number:  [id number] Number: Treating RN: Cornell Barman Feb 11, 1961 (55 y.o. Other Clinician: Date of Birth/Sex: Female) Treating ROBSON, MICHAEL Primary Care : Lavera Guise /Extender: G Referring : Sallee Lange in Treatment: 19 NPWT Application Performed for: Wound #3 Right Gluteal fold Performed By: Cornell Barman, RN Type: VAC System Coverage Size (sq cm): 0.32 Pressure Type: Constant Pressure Setting: 125 mmHG Drain Type: None Quantity of Sponges/Gauze Inserted: 2 Sponge/Dressing Type: Foam, White Date Initiated: 01/30/2017 Post Procedure Diagnosis Same as Pre-procedure Electronic Signature(s) Signed: 02/01/2017 5:04:45 PM By: Gretta Cool, RN, BSN, Kim RN, BSN Entered By: Gretta Cool, RN, BSN, Kim on 02/01/2017 10:37:26 [place], [name] ([id number]) -------------------------------------------------------------------------------- Pain Assessment Details Patient Name: [name] Date of Service: 02/01/2017 8:00 AM Medical Record Patient Account Number: [id number] Number: Treating RN: Cornell Barman [birth year] (55 y.o. Other Clinician: Date of Birth/Sex: Female) Treating ROBSON, MICHAEL Primary Care : Lavera Guise /Extender: G Referring : Sallee Lange in Treatment: 19 Active Problems Location of Pain Severity and Description of Pain Patient Has Paino No Site Locations With Dressing Change: No Pain Management and Medication Current Pain Management: Electronic Signature(s) Signed: 02/01/2017 5:04:45 PM By: Gretta Cool, RN, BSN, Kim RN, BSN Entered By: Gretta Cool, RN, BSN, Kim on 02/01/2017 08:08:21 Schue, [name] ([id number]) -------------------------------------------------------------------------------- Patient/Caregiver Education Details Patient Name: [name] Date of Service: 02/01/2017 8:00 AM Medical Record Patient Account Number: [id number] Number: Treating RN: Cornell Barman [birth year] (  56 y.o. Other Clinician: Date  of Birth/Gender: Female) Treating ROBSON, MICHAEL Primary Care Physician: Lavera Guise Physician/Extender: G Referring Physician: Sallee Lange in Treatment: 65 Education Assessment Education Provided To: Patient Education Topics Provided Offloading: Handouts: What is Offloadingo Methods: Demonstration, Explain/Verbal Responses: State content correctly Wound/Skin Impairment: Handouts: Caring for Your Ulcer Methods: Demonstration, Explain/Verbal Electronic Signature(s) Signed: 02/01/2017 5:04:45 PM By: Gretta Cool, RN, BSN, Kim RN, BSN Entered By: Gretta Cool, RN, BSN, Kim on 02/01/2017 08:51:23 [place]cier, [name] ([id number]) -------------------------------------------------------------------------------- Wound Assessment Details Patient Name: [name] Date of Service: 02/01/2017 8:00 AM Medical Record Patient Account Number: [id number] Number: Treating RN: Cornell Barman October 14, 1961 (55 y.o. Other Clinician: Date of Birth/Sex: Female) Treating ROBSON, MICHAEL Primary Care : Lavera Guise /Extender: G Referring : Sallee Lange in Treatment: 19 Wound Status Wound Number: 3 Primary Pressure Ulcer Etiology: Wound Location: Right Gluteal fold Wound Status: Open Wounding Event: Pressure Injury Comorbid Asthma, Hypertension, Type II Date Acquired: 04/21/2016 History: Diabetes, Neuropathy Weeks Of Treatment: 19 Clustered Wound: No Photos Photo Uploaded By: Gretta Cool, RN, BSN, Kim on 02/01/2017 09:43:41 Wound Measurements Length: (cm) 0.8 Width: (cm) 0.4 Depth: (cm) 6.2 Area: (cm) 0.251 Volume: (cm) 1.558 % Reduction in Area: 91.8% % Reduction in Volume: 90.9% Epithelialization: None Wound Description Classification: Category/Stage IV Foul Odor Afte Wound Margin: Distinct, outline attached Due to Product Exudate Amount: Large Slough/Fibrino Exudate Type: Serous Exudate Color: amber r Cleansing: Yes Use: No Yes Wound Bed Granulation  Amount: None Present (0%) Exposed Structure Necrotic Amount: Medium (34-66%) Fat Layer (Subcutaneous Tissue) Exposed: Yes Periwound Skin Texture Texture Color Roel, Rodneisha L. ([id number]) No Abnormalities Noted: No No Abnormalities Noted: No Callus: No Atrophie Blanche: No Crepitus: No Cyanosis: No Excoriation: No Ecchymosis: No Induration: No Erythema: No Rash: No Hemosiderin Staining: No Scarring: Yes Mottled: No Pallor: No Moisture Rubor: No No Abnormalities Noted: No Dry / Scaly: No Temperature / Pain Maceration: Yes Temperature: No Abnormality Tenderness on Palpation: Yes Wound Preparation Ulcer Cleansing: Rinsed/Irrigated with Saline Topical Anesthetic Applied: Other: lidocaine 4%, Treatment Notes Wound #3 (Right Gluteal fold) 1. Cleansed with: Clean wound with Normal Saline 2. Anesthetic Hurricaine Topical Anesthetic Spray 3. Peri-wound Care: Skin Prep 8. Negative Pressure Wound Therapy Black and White Foam combination Notes 1PIECES WHITE FOAM 1 PIECE BLACK FOAM TO BRIDGE Electronic Signature(s) Signed: 02/01/2017 5:04:45 PM By: Gretta Cool, RN, BSN, Kim RN, BSN Entered By: Gretta Cool, RN, BSN, Kim on 02/01/2017 08:22:19 [place], [name] ([id number]) -------------------------------------------------------------------------------- Vitals Details Patient Name: [name] Date of Service: 02/01/2017 8:00 AM Medical Record Patient Account Number: [id number] Number: Treating RN: Cornell Barman Jan 08, 1961 (55 y.o. Other Clinician: Date of Birth/Sex: Female) Treating ROBSON, MICHAEL Primary Care : Lavera Guise /Extender: G Referring : Sallee Lange in Treatment: 19 Vital Signs Time Taken: 08:08 Temperature (F): 98.1 Height (in): 63 Pulse (bpm): 82 Weight (lbs): 257 Respiratory Rate (breaths/min): 16 Body Mass Index (BMI): 45.5 Blood Pressure (mmHg): 104/60 Reference Range: 80 - 120 mg / dl Electronic Signature(s) Signed:  02/01/2017 5:04:45 PM By: Gretta Cool, RN, BSN, Kim RN, BSN Entered By: Gretta Cool, RN, BSN, Kim on 02/01/2017 08:08:51

## 2017-02-02 NOTE — Progress Notes (Signed)
DELANO, SCARDINO ([id number]) Visit Report for 02/01/2017 Chief Complaint Document Details Patient Name: Katelyn, [name] Date of Service: 02/01/2017 8:00 AM Medical Record Patient Account Number: [id number] Number: Treating RN: Phillis Haggis 09-19-1961 (56 y.o. Other Clinician: Date of Birth/Sex: Female) Treating Susane Bey Primary Care Provider: Joen [name] Provider/Extender: G Referring Provider: Tedra Senegal in Treatment: 31 Information Obtained from: Patient Chief Complaint Ms. Buboltz presents today for routine managament of bilateral ischial pressure ulcers and sinus tract to left popliteal fossa. Electronic Signature(s) Signed: 02/01/2017 4:28:51 PM By: Baltazar Najjar MD Entered By: Baltazar Najjar on 02/01/2017 08:40:45 Kogler, [name] ([id number]) -------------------------------------------------------------------------------- HPI Details Patient Name: [name] Date of Service: 02/01/2017 8:00 AM Medical Record Patient Account Number: [id number] Number: Treating RN: Phillis Haggis Jun 01, 1961 (56 y.o. Other Clinician: Date of Birth/Sex: Female) Treating Kari Montero Primary Care Provider: Joen [name] Provider/Extender: G Referring Provider: Tedra Senegal in Treatment: 19 History of Present Illness Location: bilateral gluteal ulcerations and left popliteal ulceration with tunneling Quality: adits to intermittent aching to ischial ulcers, no discomfort to sinus tract Severity: right iscial ulcer with increased depth Duration: chronic ulcers to bilateral ischial ulcers and sinus tract Timing: The pain is intermittent in severity as far as how intense it becomes but is present all the time. Manipulationn makes this worse. Context: The wound occurred when the patient had a fall and was unconscious for about 48 hours laying on the floor and she had pressure injury at that stage. Modifying Factors: Other treatment(s) tried include:as  noted below she has been seen by visiting wound care physicians or nurse practitioners and details have been noted Associated Signs and Symptoms: has yet to receive offloading cushions and mattress overlay HPI Description: 56 year old patient who was seen by visiting [name] wound care specialist for a wound on both her buttock and was found to have an unstageable wound on the right buttock for about 2 months. I understand that she had a fall and was laying on the floor for about 48 hours before she was found and taken to the ICU and had a long injury to her gluteal area from pressure and also had broken her right humerus. She has had a right proximal humerus fracture and has been followed up with orthopedics recently. The patient has a past medical history of type 2 diabetes mellitus, paraparesis, acute pyelonephritis, GERD, hypertension, glaucoma, chronic pain, anxiety neurosis, nicotine dependence, COPD. the patient had some debridement done and was to operative was recommended to use Silvadene dressing and offloading. She is a smoker and occasionally smokes a few cigarettes. the patient requested a second opinion for months and is here to discuss her care. 09/21/16; the patient re-presents from home today for review of 3 different wounds. I note that she was seen in the clinic here in July at which time she had bilateral buttock wounds. It was apparently suggested at that time that she use a wound VAC bridged to both wounds just near the initial tuberosity's bilaterally which she refused. The history was a bit difficult to put together. Apparently this patient became ill at the end of April of this year. She was found sitting on the floor she had apparently been for 2 days and subsequently admitted to hospital from 04/23/16 through 05/02/16 and at that point she was critically ill ultimately having sepsis secondary to UTI, nontraumatic rhabdomyolysis and diabetic ketoacidosis. She had acute renal  failure and I think required ICU care including intubation. Patient states  her wounds actually started at that point on the bilateral issue tuberosities however in reviewing the discharge summary from 5/8 I see no reference to wounds at that point. It did state that she had left lower extremity cellulitis however. Reviewing Epic I see no relevant x-rays. It would appear that her discharge creatinine was within the normal range and indeed on 9/15 her creatinine has remained normal. [facility] [name], [name]. ([id number]) rehabilitation. There the wounds on her bilateral Botox were dressed. Only just before her discharge from the nursing facility she developed an "knot" which was interpreted as cellulitis on the posterior aspect of her left knee she was given antibiotics. Apparently sometime late in July a this actually opened and became a wound at home health care was tending to however she is still having purulent drainage coming from this and by my understanding the wound depth is actually become unmeasurable. I am not really clear about what home health has been placing in any of these wound areas. The patient states that is something with silver and it. She is not been systemically unwell no fever or chills her appetite is good. She is a diabetic poorly controlled however she states that her recent blood sugars at home have been in the low to mid 100s. 09/28/16 On evaluation today patient appears to continue to exhibit the 3 areas of ulceration that were noted previous. She did have an x-ray of the right pelvis which showed evidence of potential soft tissue infection but no obvious osteomyelitis. There was a discussion last office visit concerning the possibility of a wound VAC. Witth that being said the x-ray report suggested that an MRI may be more appropriate to further evaluate the extent. Subsequently in regard to the wound over the popliteal  portion of the left lower extremity with tunneling at 12:00 the CT scan that was ordered was denied by insurance as they state the patient has not had x-rays prior to advanced imaging. Patient states that she is frustrated with the situation overall. 10/05/16 in the interval since I last saw this patient last week she has had the x-ray of the knee performed. I did review that x-ray today and fortunately shows no evidence of osteomyelitis or other acute abnormality at this point in time. She continues to have the opening iin the posterior oral popliteal space with tracking proximal up the posterior thigh. Nothing seems to have worsened but it also seems to have not improved. The same is true in regard to the right pressure ulcer over the gluteal region which extends toward the ischium. The left gluteal pressure ulcer actually appears to be doing somewhat better my opinion there is some necrotic slough but overall this appears fairly well. She tells me thatt she has some discomfort especially when home health is helping her with dressing changes as they do not know her. At worse she rates her pain to be a 5 out of 10 right now it's more like a 1 out of 10. 10/07/16; still the patient has 3 different wound areas. She has a deep stage IV wound over her right ischial tuberosity. She is due to have an MRI next week. The wound over her left ischial tuberosity is more superficial and underwent debridement today. Finally she has a small open area in her left popliteal fossa the probes on measurably forward superiorly. Still a lot of drainage coming out of this. The CT scan that I ordered 3  weeks ago was questioned by her insurance company wanting a plain x-ray first. As I understand things result of this is nothing has been done in 3 weeks in terms of imaging the thigh and she has an MRI booked of this along with her pelvis for next week line 10/12/16; patient has a deep probing wound over the right  ischiall tuberosity, stage III wound over the left visual tuberosity and a draining sinus in her left popliteal fossa. None of this much different from when I saw this 3 weeks ago. We have been using silver rope to the right ischial wound and a draining area in the left popliteal fossa. Plain silver alginate to the area on the left ischial tuberosity 10/19/16; the patient's wounds are essentially unchanged although the area on the left lower gluteal is actually improved. Our intake nurse noted drainage from the right initial tuberosity probing wound as well as the draining area in the left popliteal fossa. Both of these were cultured. She had x-rays I think at the insistence of her insurance company on 09/23/16 x-ray of the pelvis was not particularly helpful she did have soft tissue air over the right lower pelvis although with the depth of this wound this is not surprising. An x-ray of her left knee did not show any specific abnormalities. We are still using silver alginate to these wound areas. Her MRI is booked for 10/27 10/26/16; cultures of the purulent drainage in her right initial tuberosity wound grew moderate Proteus and few staph aureus. The same organisms were cultured out of the left knee sinus tract posteriorly. The staph aureus is MRSA. I had started her on Augmentin last week I added doxycycline. The MRI of the left lower extremity and pelvis was finally done. The MRI of the femur showed subcutaneous soft tissue swelling Packman, Emersen L. ([id number]) edema fluid and myositis in the vastus lateralis muscle but no soft tissue abscess septic arthritis or osteomyelitis. MRI of the pelvis showed the left wound to be more expensive extending down to the bone there was osteomyelitis. Left hamstring tendons were also involved. No septic arthritis involving the hip. The decubitus ulcer on the right side showed no definite osteomyelitis or abscess.. The right hip wound is actually the one the  probes 6 cm downward. But the MRI showing infection including osteomyelitis on the left explains the draining sinus in the popliteal fossa on the left. She did have antibiotics in the hospitalization last time and this extended into her nursing home stay but I'm not exactly sure what antibiotics and for what duration. According the patient this did include vancomycin with considerable effort of our staff we are able to get the patient into see Dr. Sampson Goon today. There were transportation difficulties. Her mother had open heart surgery and is in the ICU in [place] therefore her brother was unable to transport. Dr. Jarrett Ables office graciously arranged time to see her today. From my point of view she is going to require IV vancomycin plus perhaps a third generation cephalosporin. I plan to keep her on doxycycline and Augmentin until the IV antibiotics can be arranged. 11/02/2016 - Tallyn presents today for management of ulcers; She saw Dr. 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 She is to have the PICC line placed today and receive the initial dose for both antibiotics today. She has yet to receive the offloading chair cushion and/or mattress overlay from home health, apparently  this has been a 3 week process. I encouraged her to speak to home health regarding this matter, along with offering home health to contact the wouns care center with any questions or concerns. The left ischial pressure ulcer continues to imporve, while to right ischial ulcer has increased in depth. The popliteal fossa sinus tract remains unmeasurable due to the limitation of depth measurement (tract extends beyond our measuring devices). 11-16-2016 Ms. Eckman presents today for evaluation and management of bilateral ischial stage IV pressure ulcers and sinus tract to the left popliteal fossa. she is under the care of Dr.  Sampson Goon for IV antibiotic therapy; she states that the vancomycin was placed on hold and will be restarted at a lower dose based on her renal function. She continues taking Zosyn in addition to the vancomycin. She also states that she has yet to receive offloading cushions from home health, according to her she does not qualify for these offloading cushions because "the ulcers are unstageable ". We will contact the home health agency today to lend clarity regarding her pressure ulcers. The left popliteal fossa sinus tract continues to be a measurable as it extends beyond the length of our measuring devices.. 11/30/16; the patient is still on vancomycin and Zosyn. The depth of the draining sinus behind her left popliteal fossa is down to 4 cm although there is still serosanguineous drainage coming out of this. She saw Dr. Sampson Goon of infectious disease yesterday the idea is to weeks more of IV antibiotics and then oral antibiotics although I have not read his note. The area on the left gluteal fold is just about healed. She has a 6 cm draining sinus over the right initial tuberosity although I cannot feel bone at the base of this. As far as the patient is aware she has not had a recheck of her inflammatory markers. 12/07/16; patient is on vancomycin and Zosyn appointment with Dr. Sampson Goon on the 19th at which point the patient expects to have a change in antibiotics. Remarkable improvement over the wound over the left ischial tuberosity which is just about closed. The draining sinus in her popliteal fossa has 0.4 cm in depth. The area on the right ischial tuberosity still probes down 7 cm. This is closed and overall wound dimensions but not depth. 12/14/16; patient is completing her vancomycin and Zosyn and per her she is going to transition to Bactrim and Augmentin for another 3 weeks. The area on her left gluteal fold is closed except for some skin tears. The area behind her left knee is  no longer has any depth. The only remaining area that is of clinical concern is on the right gluteal fold probing towards the right ischial tuberosity. Today this measures 6.9 cm in depth. Very gritty surface 12/21/16; patient is now on Bactrim and Augmentin as directed by infectious disease. This should be for another 2 weeks. The area in her left gluteal fold and left popliteal are closed over and fully healed. Jose, Swati L. ([id number]) Measurements today at 7 cm in the right buttock wound is unchanged from last week. 12/27/16; patient is on Bactrim and Augmentin for another week as directed by infectious disease. She has completed her IV antibiotics. She is not been systemically unwell no fever no chills. The area on her right buttock measured over 6 cm in depth. There is no palpable bone. No evidence of surrounding soft tissue infection. She is complaining of tongue irritation and has a history of thrush 01/04/17; patient  is been back to see Dr. Sampson Goon, her Augmentin was stopped but he continued the Bactrim for another 3 weeks. Depth of the wound is 6.7 cm there is been no major change in either direction. She is not receive the wound VAC from home health I think because of confusion about who is supposed to provided will actually talk to the [facility] The net no major change. 01/18/17; patient obtained her wound VAC about 10 days ago however for some reason it was not actually put on the wound. She is therefore here for [place] to apply this I guess. No other issues are noted. She is not complaining of pain fever drainage 02/11/17; patient is here now having the wound VAC or 3-4 weeks to a deep pressure area over the right initial tuberosity. This measures 6 cm in depth today which is about half a centimeter better than 2 weeks ago. There is no evidence she is systemically unwell no fever no chills no pain around the area. Electronic Signature(s) Signed: 02/01/2017  4:28:51 PM By: Baltazar Najjar MD Entered By: Baltazar Najjar on 02/01/2017 08:42:15 Bitter, [name] ([id number]) -------------------------------------------------------------------------------- Physical Exam Details Patient Name: [name] Date of Service: 02/01/2017 8:00 AM Medical Record Patient Account Number: [id number] Number: Treating RN: Phillis Haggis Aug 22, 1961 (55 y.o. Other Clinician: Date of Birth/Sex: Female) Treating Emilina Smarr Primary Care Provider: Joen [name] Provider/Extender: G Referring Provider: Tedra Senegal in Treatment: 19 Constitutional Sitting or standing Blood Pressure is within target range for patient.. Pulse regular and within target range for patient.Marland Kitchen Respirations regular, non-labored and within target range.. Temperature is normal and within the target range for the patient.. Patient's appearance is neat and clean. Appears in no acute distress. Well nourished and well developed.. Eyes Conjunctivae clear. No discharge.Marland Kitchen Respiratory Respiratory effort is easy and symmetric bilaterally. Rate is normal at rest and on room air.Marland Kitchen Lymphatic Nonpalpable in the popliteal or inguinal area. Integumentary (Hair, Skin) The area over the left buttock is still healed also the left popliteal fossa. She completed antibiotics for underlying osteomyelitis. Psychiatric No evidence of depression, anxiety, or agitation. Calm, cooperative, and communicative. Appropriate interactions and affect.. Notes Wound exam; the area on the left gluteal remains closed with surface scar tissue. Left posterior knee is also closed. The area on the right gluteal is down to 6 cm of depth by my measurement with a curette from 6.7 cm last time. There is no surrounding soft tissue erythema crepitus or purulent drainage. Using an open curet I measured the wound and felt the surface of this small but deep area. There is nothing that required debridement, this is  better than last time Electronic Signature(s) Signed: 02/01/2017 4:28:51 PM By: Baltazar Najjar MD Entered By: Baltazar Najjar on 02/01/2017 08:44:24 Leas, [name] ([id number]) -------------------------------------------------------------------------------- Physician Orders Details Patient Name: [name] Date of Service: 02/01/2017 8:00 AM Medical Record Patient Account Number: [id number] Number: Treating RN: Huel Coventry [birth year] (55 y.o. Other Clinician: Date of Birth/Sex: Female) Treating Thanos Cousineau Primary Care Provider: Joen [name] Provider/Extender: G Referring Provider: Tedra Senegal in Treatment: 58 Verbal / Phone Orders: No Diagnosis Coding Wound Cleansing Wound #3 Right Gluteal fold o Clean wound with Normal Saline. o Cleanse wound with mild soap and water Anesthetic Wound #3 Right Gluteal fold o Topical Lidocaine 4% cream applied to wound bed prior to debridement - clinic use only Skin Barriers/Peri-Wound Care Wound #3 Right Gluteal fold o Skin Prep Dressing Change Frequency  Wound #3 Right Gluteal fold o Three times weekly - HHRN to change MONDAY, and FRIDAY and pt to come to wound clinic on [facility], every other week. Follow-up Appointments Wound #3 Right Gluteal fold o Return Appointment in 2 weeks. Off-Loading Wound #3 Right Gluteal fold o Roho cushion for wheelchair - *********[facility] to order*********** Home Health Wound #3 Right Gluteal fold o Continue Home Health Visits - [facility] *****Please order ROHO cushion****** o Home Health Nurse may visit PRN to address patientos wound care needs. o FACE TO FACE ENCOUNTER: MEDICARE and MEDICAID PATIENTS: I certify that this patient is under my care and that I had a face-to-face encounter that meets the physician face-to-face Dearing, SCARLETTE HOGSTON. ([id number]) encounter requirements with this patient on this date. The encounter with the patient was  in whole or in part for the following MEDICAL CONDITION: (primary reason for Home Healthcare) MEDICAL NECESSITY: I certify, that based on my findings, NURSING services are a medically necessary home health service. HOME BOUND STATUS: I certify that my clinical findings support that this patient is homebound (i.e., Due to illness or injury, pt requires aid of supportive devices such as crutches, cane, wheelchairs, walkers, the use of special transportation or the assistance of another person to leave their place of residence. There is a normal inability to leave the home and doing so requires considerable and taxing effort. Other absences are for medical reasons / religious services and are infrequent or of short duration when for other reasons). o If current dressing causes regression in wound condition, may D/C ordered dressing product/s and apply Normal Saline Moist Dressing daily until next Wound Healing Center / Other MD appointment. Notify Wound Healing Center of regression in wound condition at [phone]. o Please direct any NON-WOUND related issues/requests for orders to patient's Primary Care Physician Negative Pressure Wound Therapy Wound #3 Right Gluteal fold o Wound VAC settings at 125/130 mmHg continuous pressure. Use BLACK/GREEN foam to wound cavity. Use WHITE foam to fill any tunnel/s and/or undermining. Change VAC dressing 3 X WEEK. Change canister as indicated when full. Nurse may titrate settings and frequency of dressing changes as clinically indicated. - Place white foam into wound in long strip and place where you can pull the white foam out. (WHITE FOAM ONLY IN THE WOUND) USE BLACK FOAM TO BRIDGE..... Please drape wound as according. Start now please if possible. o Home Health Nurse may d/c VAC for s/s of increased infection, significant wound regression, or uncontrolled drainage. Notify Wound Healing Center at [phone]. o Number of foam/gauze pieces  used in the dressing = Medications-please add to medication list. Wound #3 Right Gluteal fold o P.O. Antibiotics - Bactrim and Augmentin o Other: - Vitamin C, Zinc, Multivitamins IV antibiotics Electronic Signature(s) Signed: 02/01/2017 4:28:51 PM By: Baltazar Najjar MD Signed: 02/01/2017 5:04:45 PM By: Elliot Gurney RN, BSN, Kim RN, BSN Entered By: Elliot Gurney, RN, BSN, Kim on 02/01/2017 08:30:15 Loose, [name] ([id number]) -------------------------------------------------------------------------------- Problem List Details Patient Name: [name] Date of Service: 02/01/2017 8:00 AM Medical Record Patient Account Number: [id number] Number: Treating RN: Phillis Haggis 11-25-1961 (55 y.o. 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 09/21/2016 09/21/2016 severity M86.18 Other acute osteomyelitis, other site 11/02/2016 11/02/2016 Resolved Problems ICD-10 Code Description Active Date Resolved Date L89.323 Pressure ulcer of left buttock, stage 3 09/21/2016 09/21/2016 Electronic Signature(s) Signed: 02/01/2017 4:28:51 PM By: Baltazar Najjar MD Blyth, [name] ([id number]) Entered By: Baltazar Najjar on 02/01/2017 08:40:21 Lehn, [name] ([id number]) -------------------------------------------------------------------------------- Progress Note Details Patient Name: [name] Date of Service: 02/01/2017 8:00 AM Medical Record Patient  Account Number: [id number] Number: Treating RN: Phillis Haggis 1961/08/16 (55 y.o. Other Clinician: Date of Birth/Sex: Female) Treating Virda Betters Primary Care Provider: Joen [name] Provider/Extender: G Referring Provider: Tedra Senegal in Treatment: 107 Subjective Chief Complaint Information obtained from Patient Ms. Logsdon presents today for routine managament of bilateral ischial pressure ulcers and sinus tract to left popliteal fossa. History of Present Illness (HPI) The following HPI elements were documented for the patient's wound: Location: bilateral gluteal ulcerations and left popliteal ulceration with tunneling Quality: adits to intermittent aching to ischial ulcers, no discomfort to sinus tract Severity: right iscial ulcer with increased depth Duration: chronic ulcers to bilateral ischial ulcers and sinus tract Timing: The pain is intermittent in severity as far as how intense it becomes but is present all the time. Manipulationn makes this worse. Context: The wound occurred when the patient had a fall and was unconscious for about 48 hours laying on the floor and she had pressure injury at that stage. Modifying Factors: Other treatment(s) tried include:as noted below she has been seen by visiting wound care physicians or nurse practitioners and details have been noted Associated Signs and Symptoms: has yet to receive offloading cushions and mattress overlay 56 year old patient who was seen by visiting [name] wound care specialist for a wound on both her buttock and was found to have an unstageable wound on the right buttock for about 2 months. I understand that she had a fall and was laying on the floor for about 48 hours before she was found and taken to the ICU and had a long injury to her gluteal area from pressure and also had broken her right humerus. She has had a right proximal humerus fracture and has been followed up with orthopedics recently.  The patient has a past medical history of type 2 diabetes mellitus, paraparesis, acute pyelonephritis, GERD, hypertension, glaucoma, chronic pain, anxiety neurosis, nicotine dependence, COPD. the patient had some debridement done and was to operative was recommended to use Silvadene dressing and offloading. She is a smoker and occasionally smokes a few cigarettes. the patient requested a second opinion for months and is here to discuss her care. 09/21/16; the patient re-presents from home today for review of 3 different wounds. I note that she was seen in the clinic here in July at which time she had bilateral buttock wounds. It was apparently suggested at that time that she use a wound VAC bridged to both wounds just near the initial tuberosity's bilaterally which she refused. Blattner, Daijah L. ([id number]) The history was a bit difficult to put together. Apparently this patient became ill at the end of April of this year. She was found sitting on the floor she had apparently been for 2 days and subsequently admitted to hospital from 04/23/16 through 05/02/16 and at that point she was critically ill ultimately having sepsis secondary to UTI, nontraumatic rhabdomyolysis and diabetic ketoacidosis. She had acute renal failure and I think required  ICU care including intubation. Patient states her wounds actually started at that point on the bilateral issue tuberosities however in reviewing the discharge summary from 5/8 I see no reference to wounds at that point. It did state that she had left lower extremity cellulitis however. Reviewing Epic I see no relevant x-rays. It would appear that her discharge creatinine was within the normal range and indeed on 9/15 her creatinine has remained normal. [facility] There the wounds on her bilateral Botox were dressed. Only just before her discharge from the nursing facility she developed an "knot" which  was interpreted as cellulitis on the posterior aspect of her left knee she was given antibiotics. Apparently sometime late in July a this actually opened and became a wound at home health care was tending to however she is still having purulent drainage coming from this and by my understanding the wound depth is actually become unmeasurable. I am not really clear about what home health has been placing in any of these wound areas. The patient states that is something with silver and it. She is not been systemically unwell no fever or chills her appetite is good. She is a diabetic poorly controlled however she states that her recent blood sugars at home have been in the low to mid 100s. 09/28/16 On evaluation today patient appears to continue to exhibit the 3 areas of ulceration that were noted previous. She did have an x-ray of the right pelvis which showed evidence of potential soft tissue infection but no obvious osteomyelitis. There was a discussion last office visit concerning the possibility of a wound VAC. Witth that being said the x-ray report suggested that an MRI may be more appropriate to further evaluate the extent. Subsequently in regard to the wound over the popliteal portion of the left lower extremity with tunneling at 12:00 the CT scan that was ordered was denied by insurance as they state the patient has not had x-rays prior to advanced imaging. Patient states that she is frustrated with the situation overall. 10/05/16 in the interval since I last saw this patient last week she has had the x-ray of the knee performed. I did review that x-ray today and fortunately shows no evidence of osteomyelitis or other acute abnormality at this point in time. She continues to have the opening iin the posterior oral popliteal space with tracking proximal up the posterior thigh. Nothing seems to have worsened but it also seems to have not improved. The same is true in regard to the right  pressure ulcer over the gluteal region which extends toward the ischium. The left gluteal pressure ulcer actually appears to be doing somewhat better my opinion there is some necrotic slough but overall this appears fairly well. She tells me thatt she has some discomfort especially when home health is helping her with dressing changes as they do not know her. At worse she rates her pain to be a 5 out of 10 right now it's more like a 1 out of 10. 10/07/16; still the patient has 3 different wound areas. She has a deep stage IV wound over her right ischial tuberosity. She is due to have an MRI next week. The wound over her left ischial tuberosity is more superficial and underwent debridement today. Finally she has a small open area in her left popliteal fossa the probes on measurably forward superiorly. Still a lot of drainage coming out of this. The CT scan that  I ordered 3 weeks ago was questioned by her insurance company wanting a plain x-ray first. As I understand things result of this is nothing has been done in 3 weeks in terms of imaging the thigh and she has an MRI booked of this along with her pelvis for next week line 10/12/16; patient has a deep probing wound over the right ischiall tuberosity, stage III wound over the left visual tuberosity and a draining sinus in her left popliteal fossa. None of this much different from when I saw this 3 weeks ago. We have been using silver rope to the right ischial wound and a draining area in the left popliteal fossa. Plain silver alginate to the area on the left ischial tuberosity 10/19/16; the patient's wounds are essentially unchanged although the area on the left lower gluteal is actually improved. Our intake nurse noted drainage from the right initial tuberosity probing wound as well as Lanz, Audreyanna L. ([id number]) the draining area in the left popliteal fossa. Both of these were cultured. She had x-rays I think at the insistence of her insurance  company on 09/23/16 x-ray of the pelvis was not particularly helpful she did have soft tissue air over the right lower pelvis although with the depth of this wound this is not surprising. An x-ray of her left knee did not show any specific abnormalities. We are still using silver alginate to these wound areas. Her MRI is booked for 10/27 10/26/16; cultures of the purulent drainage in her right initial tuberosity wound grew moderate Proteus and few staph aureus. The same organisms were cultured out of the left knee sinus tract posteriorly. The staph aureus is MRSA. I had started her on Augmentin last week I added doxycycline. The MRI of the left lower extremity and pelvis was finally done. The MRI of the femur showed subcutaneous soft tissue swelling edema fluid and myositis in the vastus lateralis muscle but no soft tissue abscess septic arthritis or osteomyelitis. MRI of the pelvis showed the left wound to be more expensive extending down to the bone there was osteomyelitis. Left hamstring tendons were also involved. No septic arthritis involving the hip. The decubitus ulcer on the right side showed no definite osteomyelitis or abscess.. The right hip wound is actually the one the probes 6 cm downward. But the MRI showing infection including osteomyelitis on the left explains the draining sinus in the popliteal fossa on the left. She did have antibiotics in the hospitalization last time and this extended into her nursing home stay but I'm not exactly sure what antibiotics and for what duration. According the patient this did include vancomycin with considerable effort of our staff we are able to get the patient into see Dr. Sampson Goon today. There were transportation difficulties. Her mother had open heart surgery and is in the ICU in [place] therefore her brother was unable to transport. Dr. Jarrett Ables office graciously arranged time to see her today. From my point of view she is going to  require IV vancomycin plus perhaps a third generation cephalosporin. I plan to keep her on doxycycline and Augmentin until the IV antibiotics can be arranged. 11/02/2016 - Ha presents today for management of ulcers; She saw Dr. 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 She is to have the PICC line placed today and receive the initial dose for both antibiotics today. She has yet to receive the offloading chair cushion and/or mattress overlay from  home health, apparently this has been a 3 week process. I encouraged her to speak to home health regarding this matter, along with offering home health to contact the wouns care center with any questions or concerns. The left ischial pressure ulcer continues to imporve, while to right ischial ulcer has increased in depth. The popliteal fossa sinus tract remains unmeasurable due to the limitation of depth measurement (tract extends beyond our measuring devices). 11-16-2016 Ms. Caccavale presents today for evaluation and management of bilateral ischial stage IV pressure ulcers and sinus tract to the left popliteal fossa. she is under the care of Dr. Sampson Goon for IV antibiotic therapy; she states that the vancomycin was placed on hold and will be restarted at a lower dose based on her renal function. She continues taking Zosyn in addition to the vancomycin. She also states that she has yet to receive offloading cushions from home health, according to her she does not qualify for these offloading cushions because "the ulcers are unstageable ". We will contact the home health agency today to lend clarity regarding her pressure ulcers. The left popliteal fossa sinus tract continues to be a measurable as it extends beyond the length of our measuring devices.. 11/30/16; the patient is still on vancomycin and Zosyn. The depth of the draining sinus behind her  left popliteal fossa is down to 4 cm although there is still serosanguineous drainage coming out of this. She saw Dr. Sampson Goon of infectious disease yesterday the idea is to weeks more of IV antibiotics and then oral antibiotics although I have not read his note. The area on the left gluteal fold is just about healed. She has a 6 cm draining sinus over the right initial tuberosity although I cannot feel bone at the base of this. As far as the patient is aware she has not had a recheck of her inflammatory markers. 12/07/16; patient is on vancomycin and Zosyn appointment with Dr. Sampson Goon on the 19th at which point the patient expects to have a change in antibiotics. Remarkable improvement over the wound over the left ischial tuberosity which is just about closed. The draining sinus in her popliteal fossa has 0.4 cm in depth. Forester, Taylynn L. ([id number]) The area on the right ischial tuberosity still probes down 7 cm. This is closed and overall wound dimensions but not depth. 12/14/16; patient is completing her vancomycin and Zosyn and per her she is going to transition to Bactrim and Augmentin for another 3 weeks. The area on her left gluteal fold is closed except for some skin tears. The area behind her left knee is no longer has any depth. The only remaining area that is of clinical concern is on the right gluteal fold probing towards the right ischial tuberosity. Today this measures 6.9 cm in depth. Very gritty surface 12/21/16; patient is now on Bactrim and Augmentin as directed by infectious disease. This should be for another 2 weeks. The area in her left gluteal fold and left popliteal are closed over and fully healed. Measurements today at 7 cm in the right buttock wound is unchanged from last week. 12/27/16; patient is on Bactrim and Augmentin for another week as directed by infectious disease. She has completed her IV antibiotics. She is not been systemically unwell no fever no chills.  The area on her right buttock measured over 6 cm in depth. There is no palpable bone. No evidence of surrounding soft tissue infection. She is complaining of tongue irritation and has a history of  thrush 01/04/17; patient is been back to see Dr. Sampson Goon, her Augmentin was stopped but he continued the Bactrim for another 3 weeks. Depth of the wound is 6.7 cm there is been no major change in either direction. She is not receive the wound VAC from home health I think because of confusion about who is supposed to provided will actually talk to the [facility] agency [facility] The net no major change. 01/18/17; patient obtained her wound VAC about 10 days ago however for some reason it was not actually put on the wound. She is therefore here for [place] to apply this I guess. No other issues are noted. She is not complaining of pain fever drainage 02/11/17; patient is here now having the wound VAC or 3-4 weeks to a deep pressure area over the right initial tuberosity. This measures 6 cm in depth today which is about half a centimeter better than 2 weeks ago. There is no evidence she is systemically unwell no fever no chills no pain around the area. Objective Constitutional Sitting or standing Blood Pressure is within target range for patient.. Pulse regular and within target range for patient.Marland Kitchen Respirations regular, non-labored and within target range.. Temperature is normal and within the target range for the patient.. Patient's appearance is neat and clean. Appears in no acute distress. Well nourished and well developed.. Vitals Time Taken: 8:08 AM, Height: 63 in, Weight: 257 lbs, BMI: 45.5, Temperature: 98.1 F, Pulse: 82 bpm, Respiratory Rate: 16 breaths/min, Blood Pressure: 104/60 mmHg. Eyes Conjunctivae clear. No discharge.Marland Kitchen Respiratory Respiratory effort is easy and symmetric bilaterally. Rate is normal at rest and on room air.Marland Kitchen Lymphatic Nonpalpable in the popliteal or inguinal  area. Wirz, Nikkol L. ([id number]) Psychiatric No evidence of depression, anxiety, or agitation. Calm, cooperative, and communicative. Appropriate interactions and affect.. General Notes: Wound exam; the area on the left gluteal remains closed with surface scar tissue. Left posterior knee is also closed. The area on the right gluteal is down to 6 cm of depth by my measurement with a curette from 6.7 cm last time. There is no surrounding soft tissue erythema crepitus or purulent drainage. Using an open curet I measured the wound and felt the surface of this small but deep area. There is nothing that required debridement, this is better than last time Integumentary (Hair, Skin) The area over the left buttock is still healed also the left popliteal fossa. She completed antibiotics for underlying osteomyelitis. Wound #3 status is Open. Original cause of wound was Pressure Injury. The wound is located on the Right Gluteal fold. The wound measures 0.8cm length x 0.4cm width x 6.2cm depth; 0.251cm^2 area and 1.558cm^3 volume. There is Fat Layer (Subcutaneous Tissue) Exposed exposed. There is a large amount of serous drainage noted. The wound margin is distinct with the outline attached to the wound base. There is no granulation within the wound bed. There is a medium (34-66%) amount of necrotic tissue within the wound bed. The periwound skin appearance exhibited: Scarring, Maceration. The periwound skin appearance did not exhibit: Callus, Crepitus, Excoriation, Induration, Rash, Dry/Scaly, Atrophie Blanche, Cyanosis, Ecchymosis, Hemosiderin Staining, Mottled, Pallor, Rubor, Erythema. Periwound temperature was noted as No Abnormality. The periwound has tenderness on palpation. 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 Cleanse wound with mild soap and water Anesthetic: Wound #3 Right Gluteal  fold: Gayton, Rockelle L. ([id number]) Topical Lidocaine 4% cream applied to wound bed prior to debridement - clinic use only Skin Barriers/Peri-Wound Care: Wound #3 Right Gluteal fold: Skin Prep Dressing Change Frequency: Wound #3 Right Gluteal fold: Three times weekly - HHRN to change MONDAY, and FRIDAY and pt to come to wound clinic on [facility], every other week. Follow-up Appointments: Wound #3 Right Gluteal fold: Return Appointment in 2 weeks. Off-Loading: Wound #3 Right Gluteal fold: Roho cushion for wheelchair - *********[facility] to order*********** Home Health: Wound #3 Right Gluteal fold: Continue Home Health Visits - [facility] *****Please order [facility] cushion****** Home Health Nurse may visit PRN to address patient s wound care needs. FACE TO FACE ENCOUNTER: MEDICARE and MEDICAID PATIENTS: I certify that this patient is under my care and that I had a face-to-face encounter that meets the physician face-to-face encounter requirements with this patient on this date. The encounter with the patient was in whole or in part for the following MEDICAL CONDITION: (primary reason for Home Healthcare) MEDICAL NECESSITY: I certify, that based on my findings, NURSING services are a medically necessary home health service. HOME BOUND STATUS: I certify that my clinical findings support that this patient is homebound (i.e., Due to illness or injury, pt requires aid of supportive devices such as crutches, cane, wheelchairs, walkers, the use of special transportation or the assistance of another person to leave their place of residence. There is a normal inability to leave the home and doing so requires considerable and taxing effort. Other absences are for medical reasons / religious services and are infrequent or of short duration when for other reasons). If current dressing causes regression  in wound condition, may D/C ordered dressing product/s and apply Normal Saline Moist Dressing daily until next Wound Healing Center / Other MD appointment. Notify Wound Healing Center of regression in wound condition at [phone]. Please direct any NON-WOUND related issues/requests for orders to patient's Primary Care Physician Negative Pressure Wound Therapy: Wound #3 Right Gluteal fold: Wound VAC settings at 125/130 mmHg continuous pressure. Use BLACK/GREEN foam to wound cavity. Use WHITE foam to fill any tunnel/s and/or undermining. Change VAC dressing 3 X WEEK. Change canister as indicated when full. Nurse may titrate settings and frequency of dressing changes as clinically indicated. - Place white foam into wound in long strip and place where you can pull the white foam out. (WHITE FOAM ONLY IN THE WOUND) USE BLACK FOAM TO BRIDGE..... Please drape wound as according. Start now please if possible. Home Health Nurse may d/c VAC for s/s of increased infection, significant wound regression, or uncontrolled drainage. Notify Wound Healing Center at [phone]. Number of foam/gauze pieces used in the dressing = Medications-please add to medication list.: Wound #3 Right Gluteal fold: P.O. Antibiotics - Bactrim and Augmentin Other: - Vitamin C, Zinc, Multivitamins IV antibiotics Pounders, Kariann L. ([id number]) continue with wound vac. Per my measurements down 0.7cm in dpeth Electronic Signature(s) Signed: 02/01/2017 4:28:51 PM By: Baltazar Najjar MD Entered By: Baltazar Najjar on 02/01/2017 08:46:10 Betley, [name] ([id number]) -------------------------------------------------------------------------------- SuperBill Details Patient Name: [name] Date of Service: 02/01/2017 Medical Record Patient Account Number: [id number] Number: Treating RN: Huel Coventry [birth year] (55 y.o. Other Clinician: Date of Birth/Sex: Female) Treating Kimiyah Blick Primary Care Provider: Joen [name] Provider/Extender: G Referring Provider: Tedra [place] in Treatment: 19 Diagnosis Coding ICD-10 Codes Code Description L89.314 Pressure ulcer of right buttock, stage 4 E11.42 Type 2 diabetes mellitus with diabetic polyneuropathy  Facility Procedures CPT4 Code: 40981191 Description: [phone] NEG PRESS WND TX <=50 SQ CM Modifier: Quantity: 1 Physician Procedures CPT4 Code: 5621308 Description: 99213 - WC PHYS LEVEL 3 - EST PT ICD-10 Description Diagnosis L89.314 Pressure ulcer of right buttock, stage 4 Modifier: Quantity: 1 Electronic Signature(s) Signed: 02/01/2017 4:28:51 PM By: Baltazar Najjar MD Signed: 02/01/2017 5:04:45 PM By: Elliot Gurney RN, BSN, Kim RN, BSN Entered By: Elliot Gurney, RN, BSN, Kim on 02/01/2017 11:34:29

## 2017-02-08 ENCOUNTER — Ambulatory Visit: Payer: Managed Care, Other (non HMO) | Admitting: Internal Medicine

## 2017-02-15 ENCOUNTER — Encounter: Payer: Managed Care, Other (non HMO) | Admitting: Internal Medicine

## 2017-02-15 DIAGNOSIS — L89314 Pressure ulcer of right buttock, stage 4: Secondary | ICD-10-CM | POA: Diagnosis not present

## 2017-02-16 NOTE — Progress Notes (Signed)
NOVI, CALIA ([id number]) Visit Report for 02/15/2017 Arrival Information Details Patient Name: [name], [name] Date of Service: 02/15/2017 8:00 AM Medical Record Patient Account Number: [id number] Number: Treating RN: Ahmed Prima 06/19/1961 (55 y.o. Other Clinician: Date of Birth/Sex: Female) Treating ROBSON, MICHAEL Primary Care [name]: [name] [name]/Extender: G Referring [name]: Sallee Lange in Treatment: 21 Visit Information History Since Last Visit All ordered tests and consults were completed: No Patient Arrived: Wheel Chair Added or deleted any medications: No Arrival Time: 08:07 Any new allergies or adverse reactions: No Accompanied By: self Had a fall or experienced change in No Transfer Assistance: [employer] activities of daily living that may affect Patient Lift risk of falls: Patient Identification Verified: Yes Signs or symptoms of abuse/neglect since last No Secondary Verification Process Yes visito Completed: Hospitalized since last visit: No Patient Requires Transmission- No Has Dressing in Place as Prescribed: Yes Based Precautions: Pain Present Now: No Patient Has Alerts: Yes Patient Alerts: DM II Electronic Signature(s) Signed: 02/15/2017 5:13:02 PM By: Alric Quan Entered By: Alric Quan on 02/15/2017 08:08:04 Bertagnolli, [name] ([id number]) -------------------------------------------------------------------------------- Encounter Discharge Information Details Patient Name: [name] Date of Service: 02/15/2017 8:00 AM Medical Record Patient Account Number: [id number] Number: Treating RN: Ahmed Prima 1961-05-30 (56 y.o. Other Clinician: Date of Birth/Sex: Female) Treating ROBSON, MICHAEL Primary Care [name]: [name] [name]: G Referring Shahla Betsill: Sallee Lange in Treatment: 21 Encounter Discharge Information Items Discharge Pain Level: 0 Discharge Condition:  Stable Ambulatory Status: Wheelchair Discharge Destination: Home Transportation: Other Accompanied By: self Schedule Follow-up Appointment: Yes Medication Reconciliation completed No and provided to Patient/Care Paradise Vensel: Patient Clinical Summary of Care: Declined Electronic Signature(s) Signed: 02/15/2017 8:50:06 AM By: Ruthine Dose Entered By: Ruthine Dose on 02/15/2017 08:50:06 Donnelly, [name] ([id number]) -------------------------------------------------------------------------------- Lower Extremity Assessment Details Patient Name: [name] Date of Service: 02/15/2017 8:00 AM Medical Record Patient Account Number: [id number] Number: Treating RN: Ahmed Prima 08/06/1961 (56 y.o. Other Clinician: Date of Birth/Sex: Female) Treating ROBSON, MICHAEL Primary Care Geroldine Esquivias: [name] [name]/Extender: G Referring Chancey Ringel: Sallee Lange in Treatment: 21 Electronic Signature(s) Signed: 02/15/2017 5:13:02 PM By: Alric Quan Entered By: Alric Quan on 02/15/2017 08:16:34 Gaul, [name] ([id number]) -------------------------------------------------------------------------------- Multi Wound Chart Details Patient Name: [name] Date of Service: 02/15/2017 8:00 AM Medical Record Patient Account Number: [id number] Number: Treating RN: Ahmed Prima [birth year] (56 y.o. Other Clinician: Date of Birth/Sex: Female) Treating ROBSON, MICHAEL Primary Care Jajaira Ruis: [name] [name]/Extender: G Referring Herta Hink: Sallee Lange in Treatment: 21 Vital Signs Height(in): 63 Pulse(bpm): 86 Weight(lbs): 257 Blood Pressure 131/58 (mmHg): Body Mass Index(BMI): 46 Temperature(F): 98.1 Respiratory Rate 18 (breaths/min): Photos: [3:No Photos] [N/A:N/A] Wound Location: [3:Right Gluteal fold] [N/A:N/A] Wounding Event: [3:Pressure Injury] [N/A:N/A] Primary Etiology: [3:Pressure Ulcer] [N/A:N/A] Comorbid History: [3:Asthma,  Hypertension, Type II Diabetes, Neuropathy] [N/A:N/A] Date Acquired: [3:04/21/2016] [N/A:N/A] Weeks of Treatment: [3:21] [N/A:N/A] Wound Status: [3:Open] [N/A:N/A] Measurements L x W x D 1x1.4x5.5 [N/A:N/A] (cm) Area (cm) : [3:1.1] [N/A:N/A] Volume (cm) : [3:6.048] [N/A:N/A] % Reduction in Area: [3:64.20%] [N/A:N/A] % Reduction in Volume: 64.80% [N/A:N/A] Classification: [3:Category/Stage IV] [N/A:N/A] Exudate Amount: [3:Large] [N/A:N/A] Exudate Type: [3:Serous] [N/A:N/A] Exudate Color: [3:amber] [N/A:N/A] Foul Odor After [3:Yes] [N/A:N/A] Cleansing: Odor Anticipated Due to No [N/A:N/A] Product Use: Wound Margin: [3:Distinct, outline attached] [N/A:N/A] Granulation Amount: [3:Medium (34-66%)] [N/A:N/A] Granulation Quality: [3:Red] [N/A:N/A] Necrotic Amount: [3:Medium (34-66%)] [N/A:N/A] Exposed Structures: Fat Layer (Subcutaneous N/A N/A Tissue) Exposed: Yes Epithelialization: None N/A N/A Debridement: Debridement ([id number]- N/A  N/A [id number]) Pre-procedure 08:19 N/A N/A Verification/Time Out Taken: Pain Control: Lidocaine 4% Topical N/A N/A Solution Tissue Debrided: Fibrin/Slough, Exudates, N/A N/A Subcutaneous Level: Skin/Subcutaneous N/A N/A Tissue Debridement Area (sq 1.4 N/A N/A cm): Instrument: Other(scoop) N/A N/A Bleeding: Minimum N/A N/A Hemostasis Achieved: Pressure N/A N/A Procedural Pain: 0 N/A N/A Post Procedural Pain: 0 N/A N/A Debridement Treatment Procedure was tolerated N/A N/A Response: well Post Debridement 1x1.4x5.5 N/A N/A Measurements L x W x D (cm) Post Debridement 6.048 N/A N/A Volume: (cm) Post Debridement Category/Stage IV N/A N/A Stage: Periwound Skin Texture: Scarring: Yes N/A N/A Excoriation: No Induration: No Callus: No Crepitus: No Rash: No Periwound Skin Maceration: Yes N/A N/A Moisture: Dry/Scaly: No Periwound Skin Color: Atrophie Blanche: No N/A N/A Cyanosis: No Ecchymosis: No Erythema: No Hemosiderin Staining:  No Mottled: No Pallor: No Rubor: No Temperature: No Abnormality N/A N/A Tenderness on Yes N/A N/A Palpation: Wound Preparation: N/A N/A [name], [name] ([id number]) Ulcer Cleansing: Rinsed/Irrigated with Saline Topical Anesthetic Applied: Other: lidocaine 4% Procedures Performed: Debridement N/A N/A Treatment Notes Electronic Signature(s) Signed: 02/15/2017 4:59:22 PM By: Linton Ham MD Entered By: Linton Ham on 02/15/2017 08:25:11 Brueggemann, [name] ([id number]) -------------------------------------------------------------------------------- Multi-Disciplinary Care Plan Details Patient Name: [name] Date of Service: 02/15/2017 8:00 AM Medical Record Patient Account Number: [id number] Number: Treating RN: Ahmed Prima 02-Dec-1961 (56 y.o. Other Clinician: Date of Birth/Sex: Female) Treating ROBSON, MICHAEL Primary Care Alexandra Lipps: [name] Solomia Harrell/Extender: G Referring Meagen Limones: Sallee Lange in Treatment: 21 Active Inactive ` Abuse / Safety / Falls / Self Care Management Nursing Diagnoses: Potential for falls Goals: Patient will remain injury free Date Initiated: 09/21/2016 Target Resolution Date: 03/25/2017 Goal Status: Active Interventions: Assess fall risk on admission and as needed Notes: ` Nutrition Nursing Diagnoses: Imbalanced nutrition Potential for alteratiion in Nutrition/Potential for imbalanced nutrition Goals: Patient/caregiver agrees to and verbalizes understanding of need to use nutritional supplements and/or vitamins as prescribed Date Initiated: 09/21/2016 Target Resolution Date: 03/25/2017 Goal Status: Active Patient/caregiver verbalizes understanding of need to maintain therapeutic glucose control per primary care physician Date Initiated: 09/21/2016 Target Resolution Date: 03/25/2017 Goal Status: Active Interventions: Assess patient nutrition upon admission and as needed per policy Ridings, Benton.  ([id number]) Notes: ` Orientation to the Wound Care Program Nursing Diagnoses: Knowledge deficit related to the wound healing center program Goals: Patient/caregiver will verbalize understanding of the [employer] Date Initiated: 09/21/2016 Target Resolution Date: 03/25/2017 Goal Status: Active Interventions: Provide education on orientation to the wound center Notes: ` Pain, Acute or Chronic Nursing Diagnoses: Pain, acute or chronic: actual or potential Potential alteration in comfort, pain Goals: Patient will verbalize adequate pain control and receive pain control interventions during procedures as needed Date Initiated: 09/21/2016 Target Resolution Date: 03/25/2017 Goal Status: Active Patient/caregiver will verbalize adequate pain control between visits Date Initiated: 09/21/2016 Target Resolution Date: 03/25/2017 Goal Status: Active Patient/caregiver will verbalize comfort level met Date Initiated: 09/21/2016 Target Resolution Date: 03/25/2017 Goal Status: Active Interventions: Assess comfort goal upon admission Complete pain assessment as per visit requirements Notes: ` Wound/Skin Impairment Nursing Diagnoses: KAYDRA, BORGEN ([id number]) Impaired tissue integrity Goals: Ulcer/skin breakdown will have a volume reduction of 30% by week 4 Date Initiated: 09/21/2016 Target Resolution Date: 03/25/2017 Goal Status: Active Ulcer/skin breakdown will have a volume reduction of 50% by week 8 Date Initiated: 09/21/2016 Target Resolution Date: 03/25/2017 Goal Status: Active Ulcer/skin breakdown will have a volume reduction of 80% by week 12 Date Initiated: 09/21/2016 Target Resolution Date:  03/25/2017 Goal Status: Active Interventions: Assess ulceration(s) every visit Notes: Electronic Signature(s) Signed: 02/15/2017 5:13:02 PM By: Alric Quan Entered By: Alric Quan on 02/15/2017 08:16:44 [name], [name]  ([id number]) -------------------------------------------------------------------------------- Pain Assessment Details Patient Name: [name] Date of Service: 02/15/2017 8:00 AM Medical Record Patient Account Number: [id number] Number: Treating RN: Ahmed Prima 1960/12/31 (56 y.o. Other Clinician: Date of Birth/Sex: Female) Treating ROBSON, MICHAEL Primary Care Alverna Fawley: [name] Garlon Tuggle/Extender: G Referring Nadira Single: Sallee Lange in Treatment: 21 Active Problems Location of Pain Severity and Description of Pain Patient Has Paino No Site Locations With Dressing Change: No Pain Management and Medication Current Pain Management: Electronic Signature(s) Signed: 02/15/2017 5:13:02 PM By: Alric Quan Entered By: Alric Quan on 02/15/2017 08:08:11 [name], [name] ([id number]) -------------------------------------------------------------------------------- Patient/Caregiver Education Details Patient Name: [name] Date of Service: 02/15/2017 8:00 AM Medical Record Patient Account Number: [id number] Number: Treating RN: Ahmed Prima 1961-11-10 (56 y.o. Other Clinician: Date of Birth/Gender: Female) Treating ROBSON, MICHAEL Primary Care Physician: [name] Physician/Extender: G Referring Physician: Sallee Lange in Treatment: 21 Education Assessment Education Provided To: Patient Education Topics Provided Wound/Skin Impairment: Handouts: Other: change dressing as ordered Methods: Demonstration, Explain/Verbal Responses: State content correctly Electronic Signature(s) Signed: 02/15/2017 5:13:02 PM By: Alric Quan Entered By: Alric Quan on 02/15/2017 08:18:34 [name], [name] ([id number]) -------------------------------------------------------------------------------- Wound Assessment Details Patient Name: [name] Date of Service: 02/15/2017 8:00 AM Medical Record Patient Account Number:  [id number] Number: Treating RN: Ahmed Prima Mar 08, 1961 (56 y.o. Other Clinician: Date of Birth/Sex: Female) Treating ROBSON, MICHAEL Primary Care Dalis Beers: [name] Dominick Morella/Extender: G Referring Taela Charbonneau: Sallee Lange in Treatment: 21 Wound Status Wound Number: 3 Primary Pressure Ulcer Etiology: Wound Location: Right Gluteal fold Wound Status: Open Wounding Event: Pressure Injury Comorbid Asthma, Hypertension, Type II Date Acquired: 04/21/2016 History: Diabetes, Neuropathy Weeks Of Treatment: 21 Clustered Wound: No Photos Photo Uploaded By: Alric Quan on 02/16/2017 07:46:35 Wound Measurements Length: (cm) 1 Width: (cm) 1.4 Depth: (cm) 5.5 Area: (cm) 1.1 Volume: (cm) 6.048 % Reduction in Area: 64.2% % Reduction in Volume: 64.8% Epithelialization: None Tunneling: No Undermining: No Wound Description Classification: Category/Stage IV Foul Odor Aft Wound Margin: Distinct, outline attached Due to Produc Exudate Amount: Large Slough/Fibrin Exudate Type: Serous Exudate Color: amber er Cleansing: Yes t Use: No o Yes Wound Bed Granulation Amount: Medium (34-66%) Exposed Structure Granulation Quality: Red Fat Layer (Subcutaneous Tissue) Exposed: Yes Necrotic Amount: Medium (34-66%) Vanderveen, [name] ([id number]) Necrotic Quality: Adherent Slough Periwound Skin Texture Texture Color No Abnormalities Noted: No No Abnormalities Noted: No Callus: No Atrophie Blanche: No Crepitus: No Cyanosis: No Excoriation: No Ecchymosis: No Induration: No Erythema: No Rash: No Hemosiderin Staining: No Scarring: Yes Mottled: No Pallor: No Moisture Rubor: No No Abnormalities Noted: No Dry / Scaly: No Temperature / Pain Maceration: Yes Temperature: No Abnormality Tenderness on Palpation: Yes Wound Preparation Ulcer Cleansing: Rinsed/Irrigated with Saline Topical Anesthetic Applied: Other: lidocaine 4%, Treatment Notes Wound #3 (Right Gluteal  fold) 1. Cleansed with: Clean wound with Normal Saline 2. Anesthetic Topical Lidocaine 4% cream to wound bed prior to debridement 3. Peri-wound Care: Skin Prep 8. Negative Pressure Wound Therapy Wound Vac to wound continuously at 169m/hg pressure Black Foam White Foam Notes 1PIECES WHITE FOAM 1 PIECE BLACK FOAM TO BRIDGE Electronic Signature(s) Signed: 02/15/2017 5:13:02 PM By: PAlric QuanEntered By: PAlric Quanon 02/15/2017 08:15:24 Ballow, [name] ([id number] -------------------------------------------------------------------------------- Vitals Details Patient Name: WBenedetto [name] Date of Service: 02/15/2017 8:00 AM  Medical Record Patient Account Number: [id number] Number: Treating RN: Ahmed Prima 1961-07-05 (55 y.o. Other Clinician: Date of Birth/Sex: Female) Treating ROBSON, MICHAEL Primary Care Sarina Robleto: [name] Strother Everitt/Extender: G Referring Loana Salvaggio: Sallee Lange in Treatment: 21 Vital Signs Time Taken: 08:08 Temperature (F): 98.1 Height (in): 63 Pulse (bpm): 86 Weight (lbs): 257 Respiratory Rate (breaths/min): 18 Body Mass Index (BMI): 45.5 Blood Pressure (mmHg): 131/58 Reference Range: 80 - 120 mg / dl Electronic Signature(s) Signed: 02/15/2017 5:13:02 PM By: Alric Quan Entered By: Alric Quan on 02/15/2017 53:91:22

## 2017-02-16 NOTE — Progress Notes (Signed)
Katelyn, [name] ([id number]) Visit Report for 02/15/2017 Chief Complaint Document Details Patient Name: [name], [name] Date of Service: 02/15/2017 8:00 AM Medical Record Patient Account Number: [id number] Number: Treating RN: Phillis Haggis 1961-09-12 (56 y.o. Other Clinician: Date of Birth/Sex: Female) Treating Turki Tapanes Primary Care Provider: Joen [name] Provider/Extender: G Referring Provider: Tedra Senegal in Treatment: 21 Information Obtained from: Patient Chief Complaint Ms. Witzke presents today for routine managament of bilateral ischial pressure ulcers and sinus tract to left popliteal fossa. Electronic Signature(s) Signed: 02/15/2017 4:59:22 PM By: Baltazar Najjar MD Entered By: Baltazar Najjar on 02/15/2017 08:25:42 Bittman, [name] ([id number]) -------------------------------------------------------------------------------- Debridement Details Patient Name: [name] Date of Service: 02/15/2017 8:00 AM Medical Record Patient Account Number: [id number] Number: Treating RN: Phillis Haggis July 05, 1961 (56 y.o. Other Clinician: Date of Birth/Sex: Female) Treating Valery Amedee Primary Care Provider: Joen [name] Provider/Extender: G Referring Provider: Tedra Senegal in Treatment: 21 Debridement Performed for Wound #3 Right Gluteal fold Assessment: Performed By: Physician Maxwell Caul, MD Debridement: Debridement Pre-procedure Yes - 08:19 Verification/Time Out Taken: Start Time: 08:20 Pain Control: Lidocaine 4% Topical Solution Level: Skin/Subcutaneous Tissue Total Area Debrided (L x 1 (cm) x 1.4 (cm) = 1.4 (cm) W): Tissue and other Viable, Non-Viable, Exudate, Fibrin/Slough, Subcutaneous material debrided: Instrument: Other : scoop Bleeding: Minimum Hemostasis Achieved: Pressure End Time: 08:23 Procedural Pain: 0 Post Procedural Pain: 0 Response to Treatment: Procedure was tolerated well Post Debridement  Measurements of Total Wound Length: (cm) 1 Stage: Category/Stage IV Width: (cm) 1.4 Depth: (cm) 5.5 Volume: (cm) 6.048 Character of Wound/Ulcer Post Requires Further Debridement: Debridement Severity of Tissue Post Fat layer exposed Debridement: Post Procedure Diagnosis Same as Pre-procedure Electronic Signature(s) Signed: 02/15/2017 4:59:22 PM By: Baltazar Najjar MD Turk, [name] ([id number]) Signed: 02/15/2017 5:13:02 PM By: Alejandro Mulling Entered By: Baltazar Najjar on 02/15/2017 08:25:25 Isaza, [name] ([id number]) -------------------------------------------------------------------------------- HPI Details Patient Name: [name] Date of Service: 02/15/2017 8:00 AM Medical Record Patient Account Number: [id number] Number: Treating RN: Phillis Haggis [birth year] (56 y.o. Other Clinician: Date of Birth/Sex: Female) Treating Akirra Lacerda Primary Care Provider: Joen [name] Provider/Extender: G Referring Provider: Tedra Senegal in Treatment: 21 History of Present Illness Location: bilateral gluteal ulcerations and left popliteal ulceration with tunneling Quality: adits to intermittent aching to ischial ulcers, no discomfort to sinus tract Severity: right iscial ulcer with increased depth Duration: chronic ulcers to bilateral ischial ulcers and sinus tract Timing: The pain is intermittent in severity as far as how intense it becomes but is present all the time. Manipulationn makes this worse. Context: The wound occurred when the patient had a fall and was unconscious for about 48 hours laying on the floor and she had pressure injury at that stage. Modifying Factors: Other treatment(s) tried include:as noted below she has been seen by visiting wound care physicians or nurse practitioners and details have been noted Associated Signs and Symptoms: has yet to receive offloading cushions and mattress overlay HPI Description: 56 year old patient who was  seen by visiting [name] wound care specialist for a wound on both her buttock and was found to have an unstageable wound on the right buttock for about 2 months. I understand that she had a fall and was laying on the floor for about 48 hours before she was found and taken to the ICU and had a long injury to her gluteal area from pressure and also had broken her right humerus. She has had a right proximal humerus  fracture and has been followed up with orthopedics recently. The patient has a past medical history of type 2 diabetes mellitus, paraparesis, acute pyelonephritis, GERD, hypertension, glaucoma, chronic pain, anxiety neurosis, nicotine dependence, COPD. the patient had some debridement done and was to operative was recommended to use Silvadene dressing and offloading. She is a smoker and occasionally smokes a few cigarettes. the patient requested a second opinion for months and is here to discuss her care. 09/21/16; the patient re-presents from home today for review of 3 different wounds. I note that she was seen in the clinic here in July at which time she had bilateral buttock wounds. It was apparently suggested at that time that she use a wound VAC bridged to both wounds just near the initial tuberosity's bilaterally which she refused. The history was a bit difficult to put together. Apparently this patient became ill at the end of April of this year. She was found sitting on the floor she had apparently been for 2 days and subsequently admitted to hospital from 04/23/16 through 05/02/16 and at that point she was critically ill ultimately having sepsis secondary to UTI, nontraumatic rhabdomyolysis and diabetic ketoacidosis. She had acute renal failure and I think required ICU care including intubation. Patient states her wounds actually started at that point on the bilateral issue tuberosities however in reviewing the discharge summary from 5/8 I see no reference to wounds at that point. It  did state that she had left lower extremity cellulitis however. Reviewing Epic I see no relevant x-rays. It would appear that her discharge creatinine was within the normal range and indeed on 9/15 her creatinine has remained normal. [facility] LORRIANN, HANSMANN. ([id number]) rehabilitation. There the wounds on her bilateral Botox were dressed. Only just before her discharge from the nursing facility she developed an "knot" which was interpreted as cellulitis on the posterior aspect of her left knee she was given antibiotics. Apparently sometime late in July a this actually opened and became a wound at home health care was tending to however she is still having purulent drainage coming from this and by my understanding the wound depth is actually become unmeasurable. I am not really clear about what home health has been placing in any of these wound areas. The patient states that is something with silver and it. She is not been systemically unwell no fever or chills her appetite is good. She is a diabetic poorly controlled however she states that her recent blood sugars at home have been in the low to mid 100s. 09/28/16 On evaluation today patient appears to continue to exhibit the 3 areas of ulceration that were noted previous. She did have an x-ray of the right pelvis which showed evidence of potential soft tissue infection but no obvious osteomyelitis. There was a discussion last office visit concerning the possibility of a wound VAC. Witth that being said the x-ray report suggested that an MRI may be more appropriate to further evaluate the extent. Subsequently in regard to the wound over the popliteal portion of the left lower extremity with tunneling at 12:00 the CT scan that was ordered was denied by insurance as they state the patient has not had x-rays prior to advanced imaging. Patient states that she is frustrated with the  situation overall. 10/05/16 in the interval since I last saw this patient last week she has had the x-ray of the knee performed. I did review that x-ray today and fortunately  shows no evidence of osteomyelitis or other acute abnormality at this point in time. She continues to have the opening iin the posterior oral popliteal space with tracking proximal up the posterior thigh. Nothing seems to have worsened but it also seems to have not improved. The same is true in regard to the right pressure ulcer over the gluteal region which extends toward the ischium. The left gluteal pressure ulcer actually appears to be doing somewhat better my opinion there is some necrotic slough but overall this appears fairly well. She tells me thatt she has some discomfort especially when home health is helping her with dressing changes as they do not know her. At worse she rates her pain to be a 5 out of 10 right now it's more like a 1 out of 10. 10/07/16; still the patient has 3 different wound areas. She has a deep stage IV wound over her right ischial tuberosity. She is due to have an MRI next week. The wound over her left ischial tuberosity is more superficial and underwent debridement today. Finally she has a small open area in her left popliteal fossa the probes on measurably forward superiorly. Still a lot of drainage coming out of this. The CT scan that I ordered 3 weeks ago was questioned by her insurance company wanting a plain x-ray first. As I understand things result of this is nothing has been done in 3 weeks in terms of imaging the thigh and she has an MRI booked of this along with her pelvis for next week line 10/12/16; patient has a deep probing wound over the right ischiall tuberosity, stage III wound over the left visual tuberosity and a draining sinus in her left popliteal fossa. None of this much different from when I saw this 3 weeks ago. We have been using silver rope to the right ischial  wound and a draining area in the left popliteal fossa. Plain silver alginate to the area on the left ischial tuberosity 10/19/16; the patient's wounds are essentially unchanged although the area on the left lower gluteal is actually improved. Our intake nurse noted drainage from the right initial tuberosity probing wound as well as the draining area in the left popliteal fossa. Both of these were cultured. She had x-rays I think at the insistence of her insurance company on 09/23/16 x-ray of the pelvis was not particularly helpful she did have soft tissue air over the right lower pelvis although with the depth of this wound this is not surprising. An x-ray of her left knee did not show any specific abnormalities. We are still using silver alginate to these wound areas. Her MRI is booked for 10/27 10/26/16; cultures of the purulent drainage in her right initial tuberosity wound grew moderate Proteus and few staph aureus. The same organisms were cultured out of the left knee sinus tract posteriorly. The staph aureus is MRSA. I had started her on Augmentin last week I added doxycycline. The MRI of the left lower extremity and pelvis was finally done. The MRI of the femur showed subcutaneous soft tissue swelling Serpa, Sussie L. ([id number]) edema fluid and myositis in the vastus lateralis muscle but no soft tissue abscess septic arthritis or osteomyelitis. MRI of the pelvis showed the left wound to be more expensive extending down to the bone there was osteomyelitis. Left hamstring tendons were also involved. No septic arthritis involving the hip. The decubitus ulcer on the right side showed no definite osteomyelitis or abscess.. The right hip wound is  actually the one the probes 6 cm downward. But the MRI showing infection including osteomyelitis on the left explains the draining sinus in the popliteal fossa on the left. She did have antibiotics in the hospitalization last time and this extended into  her nursing home stay but I'm not exactly sure what antibiotics and for what duration. According the patient this did include vancomycin with considerable effort of our staff we are able to get the patient into see Dr. Sampson Goon today. There were transportation difficulties. Her mother had open heart surgery and is in the ICU in [place] therefore her brother was unable to transport. Dr. Jarrett Ables office graciously arranged time to see her today. From my point of view she is going to require IV vancomycin plus perhaps a third generation cephalosporin. I plan to keep her on doxycycline and Augmentin until the IV antibiotics can be arranged. 11/02/2016 - Johnnette presents today for management of ulcers; She saw Dr. 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 She is to have the PICC line placed today and receive the initial dose for both antibiotics today. She has yet to receive the offloading chair cushion and/or mattress overlay from home health, apparently this has been a 3 week process. I encouraged her to speak to home health regarding this matter, along with offering home health to contact the wouns care center with any questions or concerns. The left ischial pressure ulcer continues to imporve, while to right ischial ulcer has increased in depth. The popliteal fossa sinus tract remains unmeasurable due to the limitation of depth measurement (tract extends beyond our measuring devices). 11-16-2016 Ms. Tuohey presents today for evaluation and management of bilateral ischial stage IV pressure ulcers and sinus tract to the left popliteal fossa. she is under the care of Dr. Sampson Goon for IV antibiotic therapy; she states that the vancomycin was placed on hold and will be restarted at a lower dose based on her renal function. She continues taking Zosyn in addition to the vancomycin. She also states that  she has yet to receive offloading cushions from home health, according to her she does not qualify for these offloading cushions because "the ulcers are unstageable ". We will contact the home health agency today to lend clarity regarding her pressure ulcers. The left popliteal fossa sinus tract continues to be a measurable as it extends beyond the length of our measuring devices.. 11/30/16; the patient is still on vancomycin and Zosyn. The depth of the draining sinus behind her left popliteal fossa is down to 4 cm although there is still serosanguineous drainage coming out of this. She saw Dr. Sampson Goon of infectious disease yesterday the idea is to weeks more of IV antibiotics and then oral antibiotics although I have not read his note. The area on the left gluteal fold is just about healed. She has a 6 cm draining sinus over the right initial tuberosity although I cannot feel bone at the base of this. As far as the patient is aware she has not had a recheck of her inflammatory markers. 12/07/16; patient is on vancomycin and Zosyn appointment with Dr. Sampson Goon on the 19th at which point the patient expects to have a change in antibiotics. Remarkable improvement over the wound over the left ischial tuberosity which is just about closed. The draining sinus in her popliteal fossa has 0.4 cm in depth. The area on the right ischial tuberosity still probes down 7 cm.  This is closed and overall wound dimensions but not depth. 12/14/16; patient is completing her vancomycin and Zosyn and per her she is going to transition to Bactrim and Augmentin for another 3 weeks. The area on her left gluteal fold is closed except for some skin tears. The area behind her left knee is no longer has any depth. The only remaining area that is of clinical concern is on the right gluteal fold probing towards the right ischial tuberosity. Today this measures 6.9 cm in depth. Very gritty surface 12/21/16; patient is now  on Bactrim and Augmentin as directed by infectious disease. This should be for another 2 weeks. The area in her left gluteal fold and left popliteal are closed over and fully healed. Bettcher, Roselynne L. ([id number]) Measurements today at 7 cm in the right buttock wound is unchanged from last week. 12/27/16; patient is on Bactrim and Augmentin for another week as directed by infectious disease. She has completed her IV antibiotics. She is not been systemically unwell no fever no chills. The area on her right buttock measured over 6 cm in depth. There is no palpable bone. No evidence of surrounding soft tissue infection. She is complaining of tongue irritation and has a history of thrush 01/04/17; patient is been back to see Dr. Sampson Goon, her Augmentin was stopped but he continued the Bactrim for another 3 weeks. Depth of the wound is 6.7 cm there is been no major change in either direction. She is not receive the wound VAC from home health I think because of confusion about who is supposed to provided will actually talk to the [facility] agency [facility] The net no major change. 01/18/17; patient obtained her wound VAC about 10 days ago however for some reason it was not actually put on the wound. She is therefore here for [place] to apply this I guess. No other issues are noted. She is not complaining of pain fever drainage 02/01/17; patient is here now having the wound VAC or 3-4 weeks to a deep pressure area over the right initial tuberosity. This measures 6 cm in depth today which is about half a centimeter better than 2 weeks ago. There is no evidence she is systemically unwell no fever no chills no pain around the area. 02/15/17; I follow this patient every 2 weeks for a deep area over the right ischial tuberosity. This measures 5.5 cm today which is a continued improvement of 1.2 cm from 1/10 and down 0.5 cm from her visit 2 weeks ago Electronic Signature(s) Signed: 02/15/2017 4:59:22 PM By:  Baltazar Najjar MD Entered By: Baltazar Najjar on 02/15/2017 08:27:29 Hue, [name] ([id number]) -------------------------------------------------------------------------------- Physical Exam Details Patient Name: [name] Date of Service: 02/15/2017 8:00 AM Medical Record Patient Account Number: [id number] Number: Treating RN: Phillis Haggis Aug 26, 1961 (56 y.o. Other Clinician: Date of Birth/Sex: Female) Treating Kylena Mole Primary Care Provider: Joen [name] Provider/Extender: G Referring Provider: Tedra Senegal in Treatment: 21 Constitutional Sitting or standing Blood Pressure is within target range for patient.. Pulse regular and within target range for patient.Marland Kitchen Respirations regular, non-labored and within target range.. Temperature is normal and within the target range for the patient.. Patient is in no distress. Respiratory Respiratory effort is easy and symmetric bilaterally. Rate is normal at rest and on room air.. Notes Wound exam measures 5.5 centimeters in depth which is an improvement from 6 cm 2 weeks ago. She continues to make gradual progress. There is no evidence of surrounding soft  tissue infection. Electronic Signature(s) Signed: 02/15/2017 4:59:22 PM By: Baltazar Najjar MD Entered By: Baltazar Najjar on 02/15/2017 08:30:50 Klemz, [name] ([id number]) -------------------------------------------------------------------------------- Physician Orders Details Patient Name: [name] Date of Service: 02/15/2017 8:00 AM Medical Record Patient Account Number: [id number] Number: Treating RN: Phillis Haggis Jun 19, 1961 (56 y.o. Other Clinician: Date of Birth/Sex: Female) Treating Arshi Duarte Primary Care Provider: Joen [name] Provider/Extender: G Referring Provider: Tedra Senegal in Treatment: 21 Verbal / Phone Orders: Yes Clinician: Ashok Cordia, Debi Read Back and Verified: Yes Diagnosis Coding Wound  Cleansing Wound #3 Right Gluteal fold o Clean wound with Normal Saline. o Cleanse wound with mild soap and water Anesthetic Wound #3 Right Gluteal fold o Topical Lidocaine 4% cream applied to wound bed prior to debridement - clinic use only Skin Barriers/Peri-Wound Care Wound #3 Right Gluteal fold o Skin Prep Dressing Change Frequency Wound #3 Right Gluteal fold o Three times weekly - HHRN to change MONDAY, and FRIDAY and pt to come to wound clinic on [facility], every other week. Follow-up Appointments Wound #3 Right Gluteal fold o Return Appointment in 2 weeks. Off-Loading Wound #3 Right Gluteal fold o Roho cushion for wheelchair - *********[facility] to order*********** Home Health Wound #3 Right Gluteal fold o Continue Home Health Visits - [facility] *****Please order ROHO cushion****** o Home Health Nurse may visit PRN to address patientos wound care needs. o FACE TO FACE ENCOUNTER: MEDICARE and MEDICAID PATIENTS: I certify that this patient is under my care and that I had a face-to-face encounter that meets the physician face-to-face Hartwell, ORAH SONNEN. ([id number]) encounter requirements with this patient on this date. The encounter with the patient was in whole or in part for the following MEDICAL CONDITION: (primary reason for Home Healthcare) MEDICAL NECESSITY: I certify, that based on my findings, NURSING services are a medically necessary home health service. HOME BOUND STATUS: I certify that my clinical findings support that this patient is homebound (i.e., Due to illness or injury, pt requires aid of supportive devices such as crutches, cane, wheelchairs, walkers, the use of special transportation or the assistance of another person to leave their place of residence. There is a normal inability to leave the home and doing so requires considerable and taxing effort. Other absences are for medical reasons / religious services and are  infrequent or of short duration when for other reasons). o If current dressing causes regression in wound condition, may D/C ordered dressing product/s and apply Normal Saline Moist Dressing daily until next Wound Healing Center / Other MD appointment. Notify Wound Healing Center of regression in wound condition at [phone]. o Please direct any NON-WOUND related issues/requests for orders to patient's Primary Care Physician Negative Pressure Wound Therapy Wound #3 Right Gluteal fold o Wound VAC settings at 125/130 mmHg continuous pressure. Use BLACK/GREEN foam to wound cavity. Use WHITE foam to fill any tunnel/s and/or undermining. Change VAC dressing 3 X WEEK. Change canister as indicated when full. Nurse may titrate settings and frequency of dressing changes as clinically indicated. - Place white foam into wound in long strip and place where you can pull the white foam out. (WHITE FOAM ONLY IN THE WOUND) USE BLACK FOAM TO BRIDGE..... Please drape wound as according. Start now please if possible. o Home Health Nurse may d/c VAC for s/s of increased infection, significant wound regression, or uncontrolled drainage. Notify Wound Healing Center at [phone]. o Number of foam/gauze pieces used in the dressing = Medications-please add to  medication list. Wound #3 Right Gluteal fold o P.O. Antibiotics - Bactrim and Augmentin o Other: - Vitamin C, Zinc, Multivitamins IV antibiotics Electronic Signature(s) Signed: 02/15/2017 4:59:22 PM By: Baltazar Najjar MD Signed: 02/15/2017 5:13:02 PM By: Alejandro Mulling Entered By: Alejandro Mulling on 02/15/2017 08:24:01 Gariepy, [name] ([id number]) -------------------------------------------------------------------------------- Problem List Details Patient Name: [name] Date of Service: 02/15/2017 8:00 AM Medical Record Patient Account Number: [id number] Number: Treating RN: Phillis Haggis 1961/07/15 (55 y.o. Other  Clinician: Date of Birth/Sex: Female) Treating Oluwatomisin Deman Primary Care Provider: Joen [name] Provider/Extender: G Referring Provider: Tedra Senegal in Treatment: 21 Active Problems ICD-10 Encounter Code Description Active Date Diagnosis L89.314 Pressure ulcer of right buttock, stage 4 09/21/2016 Yes E11.42 Type 2 diabetes mellitus with diabetic polyneuropathy 09/21/2016 Yes Inactive Problems ICD-10 Code Description Active Date Inactive Date L89.324 Pressure ulcer of left buttock, stage 4 11/16/2016 11/16/2016 M86.18 Other acute osteomyelitis, other site 11/02/2016 11/02/2016 L97.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 09/21/2016 09/21/2016 severity Resolved Problems ICD-10 Code Description Active Date Resolved Date L89.323 Pressure ulcer of left buttock, stage 3 09/21/2016 09/21/2016 Electronic Signature(s) Signed: 02/15/2017 4:59:22 PM By: Baltazar Najjar MD Olafson, [name] ([id number]) Entered By: Baltazar Najjar on 02/15/2017 08:24:39 Anacker, [name] ([id number]) -------------------------------------------------------------------------------- Progress Note Details Patient Name: [name] Date of Service: 02/15/2017 8:00 AM Medical Record Patient Account Number: [id number] Number: Treating RN: Phillis Haggis 04/28/1961 (56 y.o. Other Clinician: Date of Birth/Sex: Female) Treating Kemani Demarais Primary Care Provider: Joen [name] Provider/Extender: G Referring Provider: Tedra Sen[place] in Treatment: 21 Subjective Chief Complaint Information obtained from Patient Ms. Larabee presents today for routine managament of bilateral ischial pressure ulcers and sinus tract to left popliteal fossa. History of Present Illness (HPI) The following HPI elements were documented for the patient's wound: Location: bilateral gluteal ulcerations and left popliteal ulceration with tunneling Quality: adits to intermittent aching to ischial ulcers, no  discomfort to sinus tract Severity: right iscial ulcer with increased depth Duration: chronic ulcers to bilateral ischial ulcers and sinus tract Timing: The pain is intermittent in severity as far as how intense it becomes but is present all the time. Manipulationn makes this worse. Context: The wound occurred when the patient had a fall and was unconscious for about 48 hours laying on the floor and she had pressure injury at that stage. Modifying Factors: Other treatment(s) tried include:as noted below she has been seen by visiting wound care physicians or nurse practitioners and details have been noted Associated Signs and Symptoms: has yet to receive offloading cushions and mattress overlay 56 year old patient who was seen by visiting [name] wound care specialist for a wound on both her buttock and was found to have an unstageable wound on the right buttock for about 2 months. I understand that she had a fall and was laying on the floor for about 48 hours before she was found and taken to the ICU and had a long injury to her gluteal area from pressure and also had broken her right humerus. She has had a right proximal humerus fracture and has been followed up with orthopedics recently. The patient has a past medical history of type 2 diabetes mellitus, paraparesis, acute pyelonephritis, GERD, hypertension, glaucoma, chronic pain, anxiety neurosis, nicotine dependence, COPD. the patient had some debridement done and was to operative was recommended to use Silvadene dressing and offloading. She is a smoker and occasionally smokes a few cigarettes. the patient requested a second opinion for months  and is here to discuss her care. 09/21/16; the patient re-presents from home today for review of 3 different wounds. I note that she was seen in the clinic here in July at which time she had bilateral buttock wounds. It was apparently suggested at that time that she use a wound VAC bridged to both  wounds just near the initial tuberosity's bilaterally which she refused. Goguen, Prisha L. ([id number]) The history was a bit difficult to put together. Apparently this patient became ill at the end of April of this year. She was found sitting on the floor she had apparently been for 2 days and subsequently admitted to hospital from 04/23/16 through 05/02/16 and at that point she was critically ill ultimately having sepsis secondary to UTI, nontraumatic rhabdomyolysis and diabetic ketoacidosis. She had acute renal failure and I think required ICU care including intubation. Patient states her wounds actually started at that point on the bilateral issue tuberosities however in reviewing the discharge summary from 5/8 I see no reference to wounds at that point. It did state that she had left lower extremity cellulitis however. Reviewing Epic I see no relevant x-rays. It would appear that her discharge creatinine was within the normal range and indeed on 9/15 her creatinine has remained normal. [facility] There the wounds on her bilateral Botox were dressed. Only just before her discharge from the nursing facility she developed an "knot" which was interpreted as cellulitis on the posterior aspect of her left knee she was given antibiotics. Apparently sometime late in July a this actually opened and became a wound at home health care was tending to however she is still having purulent drainage coming from this and by my understanding the wound depth is actually become unmeasurable. I am not really clear about what home health has been placing in any of these wound areas. The patient states that is something with silver and it. She is not been systemically unwell no fever or chills her appetite is good. She is a diabetic poorly controlled however she states that her recent blood sugars at home have been in the low to mid 100s. 09/28/16 On evaluation  today patient appears to continue to exhibit the 3 areas of ulceration that were noted previous. She did have an x-ray of the right pelvis which showed evidence of potential soft tissue infection but no obvious osteomyelitis. There was a discussion last office visit concerning the possibility of a wound VAC. Witth that being said the x-ray report suggested that an MRI may be more appropriate to further evaluate the extent. Subsequently in regard to the wound over the popliteal portion of the left lower extremity with tunneling at 12:00 the CT scan that was ordered was denied by insurance as they state the patient has not had x-rays prior to advanced imaging. Patient states that she is frustrated with the situation overall. 10/05/16 in the interval since I last saw this patient last week she has had the x-ray of the knee performed. I did review that x-ray today and fortunately shows no evidence of osteomyelitis or other acute abnormality at this point in time. She continues to have the opening iin the posterior oral popliteal space with tracking proximal up the posterior thigh. Nothing seems to have worsened but it also seems to have not improved. The same is true in regard to the right pressure ulcer over the gluteal region which extends toward the ischium. The left gluteal pressure  ulcer actually appears to be doing somewhat better my opinion there is some necrotic slough but overall this appears fairly well. She tells me thatt she has some discomfort especially when home health is helping her with dressing changes as they do not know her. At worse she rates her pain to be a 5 out of 10 right now it's more like a 1 out of 10. 10/07/16; still the patient has 3 different wound areas. She has a deep stage IV wound over her right ischial tuberosity. She is due to have an MRI next week. The wound over her left ischial tuberosity is more superficial and underwent debridement today. Finally she has a  small open area in her left popliteal fossa the probes on measurably forward superiorly. Still a lot of drainage coming out of this. The CT scan that I ordered 3 weeks ago was questioned by her insurance company wanting a plain x-ray first. As I understand things result of this is nothing has been done in 3 weeks in terms of imaging the thigh and she has an MRI booked of this along with her pelvis for next week line 10/12/16; patient has a deep probing wound over the right ischiall tuberosity, stage III wound over the left visual tuberosity and a draining sinus in her left popliteal fossa. None of this much different from when I saw this 3 weeks ago. We have been using silver rope to the right ischial wound and a draining area in the left popliteal fossa. Plain silver alginate to the area on the left ischial tuberosity 10/19/16; the patient's wounds are essentially unchanged although the area on the left lower gluteal is actually improved. Our intake nurse noted drainage from the right initial tuberosity probing wound as well as Petrosyan, Emoni L. ([id number]) the draining area in the left popliteal fossa. Both of these were cultured. She had x-rays I think at the insistence of her insurance company on 09/23/16 x-ray of the pelvis was not particularly helpful she did have soft tissue air over the right lower pelvis although with the depth of this wound this is not surprising. An x-ray of her left knee did not show any specific abnormalities. We are still using silver alginate to these wound areas. Her MRI is booked for 10/27 10/26/16; cultures of the purulent drainage in her right initial tuberosity wound grew moderate Proteus and few staph aureus. The same organisms were cultured out of the left knee sinus tract posteriorly. The staph aureus is MRSA. I had started her on Augmentin last week I added doxycycline. The MRI of the left lower extremity and pelvis was finally done. The MRI of the femur showed  subcutaneous soft tissue swelling edema fluid and myositis in the vastus lateralis muscle but no soft tissue abscess septic arthritis or osteomyelitis. MRI of the pelvis showed the left wound to be more expensive extending down to the bone there was osteomyelitis. Left hamstring tendons were also involved. No septic arthritis involving the hip. The decubitus ulcer on the right side showed no definite osteomyelitis or abscess.. The right hip wound is actually the one the probes 6 cm downward. But the MRI showing infection including osteomyelitis on the left explains the draining sinus in the popliteal fossa on the left. She did have antibiotics in the hospitalization last time and this extended into her nursing home stay but I'm not exactly sure what antibiotics and for what duration. According the patient this did include vancomycin with considerable effort of our  staff we are able to get the patient into see Dr. Sampson Goon today. There were transportation difficulties. Her mother had open heart surgery and is in the ICU in [place] therefore her brother was unable to transport. Dr. Jarrett Ables office graciously arranged time to see her today. From my point of view she is going to require IV vancomycin plus perhaps a third generation cephalosporin. I plan to keep her on doxycycline and Augmentin until the IV antibiotics can be arranged. 11/02/2016 - Aryia presents today for management of ulcers; She saw Dr. 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 She is to have the PICC line placed today and receive the initial dose for both antibiotics today. She has yet to receive the offloading chair cushion and/or mattress overlay from home health, apparently this has been a 3 week process. I encouraged her to speak to home health regarding this matter, along with offering home health to contact the wouns care  center with any questions or concerns. The left ischial pressure ulcer continues to imporve, while to right ischial ulcer has increased in depth. The popliteal fossa sinus tract remains unmeasurable due to the limitation of depth measurement (tract extends beyond our measuring devices). 11-16-2016 Ms. Ehrler presents today for evaluation and management of bilateral ischial stage IV pressure ulcers and sinus tract to the left popliteal fossa. she is under the care of Dr. Sampson Goon for IV antibiotic therapy; she states that the vancomycin was placed on hold and will be restarted at a lower dose based on her renal function. She continues taking Zosyn in addition to the vancomycin. She also states that she has yet to receive offloading cushions from home health, according to her she does not qualify for these offloading cushions because "the ulcers are unstageable ". We will contact the home health agency today to lend clarity regarding her pressure ulcers. The left popliteal fossa sinus tract continues to be a measurable as it extends beyond the length of our measuring devices.. 11/30/16; the patient is still on vancomycin and Zosyn. The depth of the draining sinus behind her left popliteal fossa is down to 4 cm although there is still serosanguineous drainage coming out of this. She saw Dr. Sampson Goon of infectious disease yesterday the idea is to weeks more of IV antibiotics and then oral antibiotics although I have not read his note. The area on the left gluteal fold is just about healed. She has a 6 cm draining sinus over the right initial tuberosity although I cannot feel bone at the base of this. As far as the patient is aware she has not had a recheck of her inflammatory markers. 12/07/16; patient is on vancomycin and Zosyn appointment with Dr. Sampson Goon on the 19th at which point the patient expects to have a change in antibiotics. Remarkable improvement over the wound over the left ischial  tuberosity which is just about closed. The draining sinus in her popliteal fossa has 0.4 cm in depth. Haviland, Kattaleya L. ([id number]) The area on the right ischial tuberosity still probes down 7 cm. This is closed and overall wound dimensions but not depth. 12/14/16; patient is completing her vancomycin and Zosyn and per her she is going to transition to Bactrim and Augmentin for another 3 weeks. The area on her left gluteal fold is closed except for some skin tears. The area behind her left knee is no longer has any depth. The only remaining area that  is of clinical concern is on the right gluteal fold probing towards the right ischial tuberosity. Today this measures 6.9 cm in depth. Very gritty surface 12/21/16; patient is now on Bactrim and Augmentin as directed by infectious disease. This should be for another 2 weeks. The area in her left gluteal fold and left popliteal are closed over and fully healed. Measurements today at 7 cm in the right buttock wound is unchanged from last week. 12/27/16; patient is on Bactrim and Augmentin for another week as directed by infectious disease. She has completed her IV antibiotics. She is not been systemically unwell no fever no chills. The area on her right buttock measured over 6 cm in depth. There is no palpable bone. No evidence of surrounding soft tissue infection. She is complaining of tongue irritation and has a history of thrush 01/04/17; patient is been back to see Dr. Sampson Goon, her Augmentin was stopped but he continued the Bactrim for another 3 weeks. Depth of the wound is 6.7 cm there is been no major change in either direction. She is not receive the wound VAC from home health I think because of confusion about who is supposed to provided will actually talk to the [facility] agency today [facility] The net no major change. 01/18/17; patient obtained her wound VAC about 10 days ago however for some reason it was not actually put on the wound. She  is therefore here for [place] to apply this I guess. No other issues are noted. She is not complaining of pain fever drainage 02/01/17; patient is here now having the wound VAC or 3-4 weeks to a deep pressure area over the right initial tuberosity. This measures 6 cm in depth today which is about half a centimeter better than 2 weeks ago. There is no evidence she is systemically unwell no fever no chills no pain around the area. 02/15/17; I follow this patient every 2 weeks for a deep area over the right ischial tuberosity. This measures 5.5 cm today which is a continued improvement of 1.2 cm from 1/10 and down 0.5 cm from her visit 2 weeks ago Objective Constitutional Sitting or standing Blood Pressure is within target range for patient.. Pulse regular and within target range for patient.Marland Kitchen Respirations regular, non-labored and within target range.. Temperature is normal and within the target range for the patient.. Patient is in no distress. Vitals Time Taken: 8:08 AM, Height: 63 in, Weight: 257 lbs, BMI: 45.5, Temperature: 98.1 F, Pulse: 86 bpm, Respiratory Rate: 18 breaths/min, Blood Pressure: 131/58 mmHg. Respiratory Respiratory effort is easy and symmetric bilaterally. Rate is normal at rest and on room air.. General Notes: Wound exam measures 5.5 centimeters in depth which is an improvement from 6 cm 2 weeks ago. She continues to make gradual progress. There is no evidence of surrounding soft tissue Habibi, Ladean L. ([id number]) infection. Integumentary (Hair, Skin) Wound #3 status is Open. Original cause of wound was Pressure Injury. The wound is located on the Right Gluteal fold. The wound measures 1cm length x 1.4cm width x 5.5cm depth; 1.1cm^2 area and 6.048cm^3 volume. There is Fat Layer (Subcutaneous Tissue) Exposed exposed. There is no tunneling or undermining noted. There is a large amount of serous drainage noted. The wound margin is distinct with the outline attached to the wound  base. There is medium (34-66%) red granulation within the wound bed. There is a medium (34-66%) amount of necrotic tissue within the wound bed including Adherent Slough. The periwound skin appearance exhibited: Scarring,  Maceration. The periwound skin appearance did not exhibit: Callus, Crepitus, Excoriation, Induration, Rash, Dry/Scaly, Atrophie Blanche, Cyanosis, Ecchymosis, Hemosiderin Staining, Mottled, Pallor, Rubor, Erythema. Periwound temperature was noted as No Abnormality. The periwound has tenderness on palpation. 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 There was a Skin/Subcutaneous Tissue Debridement ([id number]) debridement with total area of 1.4 sq cm performed by Maxwell Caul, MD. with the following instrument(s): scoop to remove Viable and Non-Viable tissue/material including Exudate, Fibrin/Slough, and Subcutaneous after achieving pain control using Lidocaine 4% Topical Solution. A time out was conducted at 08:19, prior to the start of the procedure. A Minimum amount of bleeding was controlled with Pressure. The procedure was tolerated well with a pain level of 0 throughout and a pain level of 0 following the procedure. Post Debridement Measurements: 1cm length x 1.4cm width x 5.5cm depth; 6.048cm^3 volume. Post debridement Stage noted as Category/Stage IV. Character of Wound/Ulcer Post Debridement requires further debridement. Severity of Tissue Post Debridement is: Fat layer exposed. Post procedure Diagnosis Wound #3: Same as Pre-Procedure Heckel, Lyndia L. ([id number]) Plan Wound Cleansing: Wound #3 Right Gluteal fold: Clean wound with Normal Saline. Cleanse wound with mild soap and water Anesthetic: Wound #3 Right Gluteal fold: Topical Lidocaine 4% cream applied to wound  bed prior to debridement - clinic use only Skin Barriers/Peri-Wound Care: Wound #3 Right Gluteal fold: Skin Prep Dressing Change Frequency: Wound #3 Right Gluteal fold: Three times weekly - HHRN to change MONDAY, and FRIDAY and pt to come to wound clinic on [name], every other week. Follow-up Appointments: Wound #3 Right Gluteal fold: Return Appointment in 2 weeks. Off-Loading: Wound #3 Right Gluteal fold: Roho cushion for wheelchair - *********[facility] to order*********** Home Health: Wound #3 Right Gluteal fold: Continue Home Health Visits - [facility] *****Please order [facility] cushion****** Home Health Nurse may visit PRN to address patient s wound care needs. FACE TO FACE ENCOUNTER: MEDICARE and MEDICAID PATIENTS: I certify that this patient is under my care and that I had a face-to-face encounter that meets the physician face-to-face encounter requirements with this patient on this date. The encounter with the patient was in whole or in part for the following MEDICAL CONDITION: (primary reason for Home Healthcare) MEDICAL NECESSITY: I certify, that based on my findings, NURSING services are a medically necessary home health service. HOME BOUND STATUS: I certify that my clinical findings support that this patient is homebound (i.e., Due to illness or injury, pt requires aid of supportive devices such as crutches, cane, wheelchairs, walkers, the use of special transportation or the assistance of another person to leave their place of residence. There is a normal inability to leave the home and doing so requires considerable and taxing effort. Other absences are for medical reasons / religious services and are infrequent or of short duration when for other reasons). If current dressing causes regression in wound condition, may D/C ordered dressing product/s and apply Normal Saline Moist Dressing daily until next Wound Healing Center / Other MD appointment. Notify  Wound Healing Center of regression in wound condition at [phone]. Please direct any NON-WOUND related issues/requests for orders to patient's Primary Care Physician Negative Pressure Wound Therapy: Wound #3 Right Gluteal fold: Wound VAC settings at 125/130 mmHg continuous pressure. Use BLACK/GREEN foam to wound cavity. Use WHITE foam  to fill any tunnel/s and/or undermining. Change VAC dressing 3 X WEEK. Change canister as indicated when full. Nurse may titrate settings and frequency of dressing changes as clinically indicated. - Place white foam into wound in long strip and place where you can pull the white foam out. (WHITE FOAM ONLY IN THE WOUND) USE BLACK FOAM TO BRIDGE..... Please drape wound as according. Start now please if possible. Home Health Nurse may d/c VAC for s/s of increased infection, significant wound regression, or uncontrolled drainage. Notify Wound Healing Center at [phone]. Schwoerer, Glenyce L. ([id number]) Number of foam/gauze pieces used in the dressing = Medications-please add to medication list.: Wound #3 Right Gluteal fold: P.O. Antibiotics - Bactrim and Augmentin Other: - Vitamin C, Zinc, Multivitamins IV antibiotics CONTINUE WOUND VAC Continues to require debridment of a Gritty surface/non viable surface tissue Electronic Signature(s) Signed: 02/15/2017 8:31:44 AM By: Baltazar Najjar MD Entered By: Baltazar Najjar on 02/15/2017 08:31:44 Kressin, [name] ([id number]) -------------------------------------------------------------------------------- SuperBill Details Patient Name: [name] Date of Service: 02/15/2017 Medical Record Patient Account Number: [id number] Number: Treating RN: Phillis Haggis 1961/02/28 (56 y.o. Other Clinician: Date of Birth/Sex: Female) Treating Juana Haralson Primary Care Provider: Joen [name] Provider/Extender: G Referring Provider: Joen [name] Service Line: Outpatient Weeks in Treatment: 21 Diagnosis  Coding ICD-10 Codes Code Description L89.314 Pressure ulcer of right buttock, stage 4 E11.42 Type 2 diabetes mellitus with diabetic polyneuropathy Facility Procedures CPT4 Code: [id number] Description: 11042 - DEB SUBQ TISSUE 20 SQ CM/< ICD-10 Description Diagnosis L89.314 Pressure ulcer of right buttock, stage 4 Modifier: Quantity: 1 Physician Procedures CPT4 Code: [id number] Description: 11042 - WC PHYS SUBQ TISS 20 SQ CM ICD-10 Description Diagnosis L89.314 Pressure ulcer of right buttock, stage 4 Modifier: Quantity: 1 Electronic Signature(s) Signed: 02/15/2017 4:59:22 PM By: Baltazar Najjar MD Entered By: Baltazar Najjar on 02/15/2017 08:32:07

## 2017-02-22 ENCOUNTER — Ambulatory Visit: Payer: Managed Care, Other (non HMO) | Admitting: Internal Medicine

## 2017-03-01 ENCOUNTER — Encounter: Payer: Managed Care, Other (non HMO) | Attending: Internal Medicine | Admitting: Internal Medicine

## 2017-03-01 ENCOUNTER — Other Ambulatory Visit
Admission: RE | Admit: 2017-03-01 | Discharge: 2017-03-01 | Disposition: A | Discharge status: Home / Self Care | Payer: Managed Care, Other (non HMO) | Source: Ambulatory Visit | Attending: Internal Medicine | Admitting: Internal Medicine

## 2017-03-01 DIAGNOSIS — J449 Chronic obstructive pulmonary disease, unspecified: Secondary | ICD-10-CM | POA: Diagnosis not present

## 2017-03-01 DIAGNOSIS — E1142 Type 2 diabetes mellitus with diabetic polyneuropathy: Secondary | ICD-10-CM | POA: Diagnosis not present

## 2017-03-01 DIAGNOSIS — I10 Essential (primary) hypertension: Secondary | ICD-10-CM | POA: Diagnosis not present

## 2017-03-01 DIAGNOSIS — F411 Generalized anxiety disorder: Secondary | ICD-10-CM | POA: Insufficient documentation

## 2017-03-01 DIAGNOSIS — F17210 Nicotine dependence, cigarettes, uncomplicated: Secondary | ICD-10-CM | POA: Insufficient documentation

## 2017-03-01 DIAGNOSIS — B999 Unspecified infectious disease: Secondary | ICD-10-CM | POA: Insufficient documentation

## 2017-03-01 DIAGNOSIS — K219 Gastro-esophageal reflux disease without esophagitis: Secondary | ICD-10-CM | POA: Insufficient documentation

## 2017-03-01 DIAGNOSIS — L89314 Pressure ulcer of right buttock, stage 4: Secondary | ICD-10-CM | POA: Diagnosis present

## 2017-03-03 NOTE — Progress Notes (Signed)
TANDI, HANKO ([id number]) Visit Report for 03/01/2017 Arrival Information Details Patient Name: [name], [name] Date of Service: 03/01/2017 8:45 AM Medical Record Patient Account Number: [id number] Number: Treating RN: Ahmed Prima 08-11-1961 (56 y.o. Other Clinician: Date of Birth/Sex: Female) Treating ROBSON, MICHAEL Primary Care : Lavera Guise /Extender: G Referring : Sallee Lange in Treatment: 23 Visit Information History Since Last Visit All ordered tests and consults were completed: No Patient Arrived: Wheel Chair Added or deleted any medications: No Arrival Time: 08:49 Any new allergies or adverse reactions: No Accompanied By: self Had a fall or experienced change in No Transfer Assistance: Easy[employer] activities of daily living that may affect Patient Lift risk of falls: Patient Identification Verified: Yes Signs or symptoms of abuse/neglect since last No Secondary Verification Process Yes visito Completed: Hospitalized since last visit: No Patient Requires Transmission- No Has Dressing in Place as Prescribed: Yes Based Precautions: Pain Present Now: No Patient Has Alerts: Yes Patient Alerts: DM II Electronic Signature(s) Signed: 03/01/2017 4:45:11 PM By: Alric Quan Entered By: Alric Quan on 03/01/2017 08:51:08 Laspina, Rieley Carlean Jews ([id number]) -------------------------------------------------------------------------------- Encounter Discharge Information Details Patient Name: [name] Date of Service: 03/01/2017 8:45 AM Medical Record Patient Account Number: [id number] Number: Treating RN: Ahmed Prima 06/21/1961 (56 y.o. Other Clinician: Date of Birth/Sex: Female) Treating ROBSON, MICHAEL Primary Care : Lavera Guise /Extender: G Referring : Sallee Lange in Treatment: 23 Encounter Discharge Information Items Schedule Follow-up Appointment: No Medication  Reconciliation completed and No provided to Patient/Care : Patient Clinical Summary of Care: Declined Electronic Signature(s) Signed: 03/01/2017 9:40:40 AM By: Ruthine Dose Entered By: Ruthine Dose on 03/01/2017 09:40:40 [place], [name] ([id number]) -------------------------------------------------------------------------------- Lower Extremity Assessment Details Patient Name: [name] Date of Service: 03/01/2017 8:45 AM Medical Record Patient Account Number: [id number] Number: Treating RN: Ahmed Prima 08/08/1961 (56 y.o. Other Clinician: Date of Birth/Sex: Female) Treating ROBSON, MICHAEL Primary Care : Lavera Guise /Extender: G Referring : Sallee Lange in Treatment: 23 Electronic Signature(s) Signed: 03/01/2017 4:45:11 PM By: Alric Quan Entered By: Alric Quan on 03/01/2017 09:01:33 [place], [name] ([id number]) -------------------------------------------------------------------------------- Multi Wound Chart Details Patient Name: [name] Date of Service: 03/01/2017 8:45 AM Medical Record Patient Account Number: [id number] Number: Treating RN: Ahmed Prima 1961-05-27 (56 y.o. Other Clinician: Date of Birth/Sex: Female) Treating ROBSON, MICHAEL Primary Care : Lavera Guise /Extender: G Referring : Sallee Lange in Treatment: 23 Vital Signs Height(in): 63 Pulse(bpm): 97 Weight(lbs): 257 Blood Pressure 140/65 (mmHg): Body Mass Index(BMI): 46 Temperature(F): 98.4 Respiratory Rate 18 (breaths/min): Photos: [3:No Photos] [N/A:N/A] Wound Location: [3:Right Gluteal fold] [N/A:N/A] Wounding Event: [3:Pressure Injury] [N/A:N/A] Primary Etiology: [3:Pressure Ulcer] [N/A:N/A] Comorbid History: [3:Asthma, Hypertension, Type II Diabetes, Neuropathy] [N/A:N/A] Date Acquired: [3:04/21/2016] [N/A:N/A] Weeks of Treatment: [3:23] [N/A:N/A] Wound Status: [3:Open]  [N/A:N/A] Measurements L x W x D 1x1x5.1 [N/A:N/A] (cm) Area (cm) : [3:0.785] [N/A:N/A] Volume (cm) : [3:4.006] [N/A:N/A] % Reduction in Area: [3:74.40%] [N/A:N/A] % Reduction in Volume: 76.70% [N/A:N/A] Classification: [3:Category/Stage IV] [N/A:N/A] Exudate Amount: [3:Large] [N/A:N/A] Exudate Type: [3:Purulent] [N/A:N/A] Exudate Color: [3:yellow, brown, green] [N/A:N/A] Foul Odor After [3:Yes] [N/A:N/A] Cleansing: Odor Anticipated Due to No [N/A:N/A] Product Use: Wound Margin: [3:Distinct, outline attached] [N/A:N/A] Granulation Amount: [3:Medium (34-66%)] [N/A:N/A] Granulation Quality: [3:Red] [N/A:N/A] Necrotic Amount: [3:Medium (34-66%)] [N/A:N/A] Exposed Structures: Fat Layer (Subcutaneous N/A N/A Tissue) Exposed: Yes Epithelialization: None N/A N/A Debridement: Debridement ([id number]- N/A N/A 11047) Pre-procedure 09:03 N/A N/A Verification/Time Out Taken: Pain Control: Lidocaine 4% Topical N/A N/A  Solution Tissue Debrided: Fibrin/Slough, Exudates, N/A N/A Subcutaneous Level: Skin/Subcutaneous N/A N/A Tissue Debridement Area (sq 1 N/A N/A cm): Instrument: Other(scoop) N/A N/A Specimen: Swab N/A N/A Number of Specimens 1 N/A N/A Taken: Bleeding: Minimum N/A N/A Hemostasis Achieved: Pressure N/A N/A Procedural Pain: 0 N/A N/A Post Procedural Pain: 0 N/A N/A Debridement Treatment Procedure was tolerated N/A N/A Response: well Post Debridement 1x1x5.1 N/A N/A Measurements L x W x D (cm) Post Debridement 4.006 N/A N/A Volume: (cm) Post Debridement Category/Stage IV N/A N/A Stage: Periwound Skin Texture: Scarring: Yes N/A N/A Excoriation: No Induration: No Callus: No Crepitus: No Rash: No Periwound Skin Maceration: Yes N/A N/A Moisture: Dry/Scaly: No Periwound Skin Color: Atrophie Blanche: No N/A N/A Cyanosis: No Ecchymosis: No Erythema: No Hemosiderin Staining: No Mottled: No Pallor: No Rubor: No Temperature: No Abnormality N/A N/A Dacruz, Yameli  L. ([id number]) Tenderness on Yes N/A N/A Palpation: Wound Preparation: Ulcer Cleansing: N/A N/A Rinsed/Irrigated with Saline Topical Anesthetic Applied: Other: lidocaine 4% Procedures Performed: Debridement N/A N/A Treatment Notes Electronic Signature(s) Signed: 03/02/2017 5:01:50 PM By: Linton Ham MD Entered By: Linton Ham on 03/01/2017 09:15:04 [place], [name] ([id number]) -------------------------------------------------------------------------------- Multi-Disciplinary Care Plan Details Patient Name: [name] Date of Service: 03/01/2017 8:45 AM Medical Record Patient Account Number: [id number] Number: Treating RN: Ahmed Prima 1961/06/24 (56 y.o. Other Clinician: Date of Birth/Sex: Female) Treating ROBSON, MICHAEL Primary Care Shanon Seawright: Lavera Guise Jermie Hippe/Extender: G Referring Tahnee Cifuentes: Sallee Lange in Treatment: 23 Active Inactive ` Abuse / Safety / Falls / Self Care Management Nursing Diagnoses: Potential for falls Goals: Patient will remain injury free Date Initiated: 09/21/2016 Target Resolution Date: 03/25/2017 Goal Status: Active Interventions: Assess fall risk on admission and as needed Notes: ` Nutrition Nursing Diagnoses: Imbalanced nutrition Potential for alteratiion in Nutrition/Potential for imbalanced nutrition Goals: Patient/caregiver agrees to and verbalizes understanding of need to use nutritional supplements and/or vitamins as prescribed Date Initiated: 09/21/2016 Target Resolution Date: 03/25/2017 Goal Status: Active Patient/caregiver verbalizes understanding of need to maintain therapeutic glucose control per primary care physician Date Initiated: 09/21/2016 Target Resolution Date: 03/25/2017 Goal Status: Active Interventions: Assess patient nutrition upon admission and as needed per policy Scullin, [place]. ([id number]) Notes: ` Orientation to the Wound Care Program Nursing Diagnoses: Knowledge deficit  related to the wound healing center program Goals: Patient/caregiver will verbalize understanding of the [place] Date Initiated: 09/21/2016 Target Resolution Date: 03/25/2017 Goal Status: Active Interventions: Provide education on orientation to the wound center Notes: ` Pain, Acute or Chronic Nursing Diagnoses: Pain, acute or chronic: actual or potential Potential alteration in comfort, pain Goals: Patient will verbalize adequate pain control and receive pain control interventions during procedures as needed Date Initiated: 09/21/2016 Target Resolution Date: 03/25/2017 Goal Status: Active Patient/caregiver will verbalize adequate pain control between visits Date Initiated: 09/21/2016 Target Resolution Date: 03/25/2017 Goal Status: Active Patient/caregiver will verbalize comfort level met Date Initiated: 09/21/2016 Target Resolution Date: 03/25/2017 Goal Status: Active Interventions: Assess comfort goal upon admission Complete pain assessment as per visit requirements Notes: ` Wound/Skin Impairment Nursing Diagnoses: HULDA, REDDIX ([id number]) Impaired tissue integrity Goals: Ulcer/skin breakdown will have a volume reduction of 30% by week 4 Date Initiated: 09/21/2016 Target Resolution Date: 03/25/2017 Goal Status: Active Ulcer/skin breakdown will have a volume reduction of 50% by week 8 Date Initiated: 09/21/2016 Target Resolution Date: 03/25/2017 Goal Status: Active Ulcer/skin breakdown will have a volume reduction of 80% by week 12 Date Initiated: 09/21/2016 Target Resolution Date: 03/25/2017 Goal Status: Active Interventions:  Assess ulceration(s) every visit Notes: Electronic Signature(s) Signed: 03/01/2017 4:45:11 PM By: Alric Quan Entered By: Alric Quan on 03/01/2017 09:01:39 [place], [name] ([id number]) -------------------------------------------------------------------------------- Pain Assessment Details Patient Name: [name] Date of Service: 03/01/2017 8:45 AM Medical Record Patient Account Number: [id number] Number: Treating RN: Ahmed Prima [birth year] (56 y.o. Other Clinician: Date of Birth/Sex: Female) Treating ROBSON, MICHAEL Primary Care Lakeya Mulka: Lavera Guise Basilia Stuckert/Extender: G Referring Keaja Reaume: Sallee Lange in Treatment: 23 Active Problems Location of Pain Severity and Description of Pain Patient Has Paino No Site Locations With Dressing Change: No Pain Management and Medication Current Pain Management: Electronic Signature(s) Signed: 03/01/2017 4:45:11 PM By: Alric Quan Entered By: Alric Quan on 03/01/2017 08:51:14 Crooker, Brayden Carlean Jews ([id number]) -------------------------------------------------------------------------------- Wound Assessment Details Patient Name: [name] Date of Service: 03/01/2017 8:45 AM Medical Record Patient Account Number: [id number] Number: Treating RN: Ahmed Prima 11-Jun-1961 (56 y.o. Other Clinician: Date of Birth/Sex: Female) Treating ROBSON, MICHAEL Primary Care Calayah Guadarrama: Lavera Guise Jaze Rodino/Extender: G Referring Daryan Buell: Sallee Lange in Treatment: 23 Wound Status Wound Number: 3 Primary Pressure Ulcer Etiology: Wound Location: Right Gluteal fold Wound Status: Open Wounding Event: Pressure Injury Comorbid Asthma, Hypertension, Type II Date Acquired: 04/21/2016 History: Diabetes, Neuropathy Weeks Of Treatment: 23 Clustered Wound: No Photos Photo Uploaded By: Alric Quan on 03/01/2017 09:44:29 Wound Measurements Length: (cm) 1 Width: (cm) 1 Depth: (cm) 5.1 Area: (cm) 0.785 Volume: (cm) 4.006 % Reduction in Area: 74.4% % Reduction in Volume: 76.7% Epithelialization: None Tunneling: No Undermining: No Wound Description Classification: Category/Stage IV Foul Odor Aft Wound Margin: Distinct, outline attached Due to Produc Exudate Amount: Large Slough/Fibrin Exudate Type:  Purulent Exudate Color: yellow, brown, green er Cleansing: Yes t Use: No o Yes Wound Bed Granulation Amount: Medium (34-66%) Exposed Structure Granulation Quality: Red Fat Layer (Subcutaneous Tissue) Exposed: Yes Necrotic Amount: Medium (34-66%) Brandon, Vanette L. ([id number]) Necrotic Quality: Adherent Slough Periwound Skin Texture Texture Color No Abnormalities Noted: No No Abnormalities Noted: No Callus: No Atrophie Blanche: No Crepitus: No Cyanosis: No Excoriation: No Ecchymosis: No Induration: No Erythema: No Rash: No Hemosiderin Staining: No Scarring: Yes Mottled: No Pallor: No Moisture Rubor: No No Abnormalities Noted: No Dry / Scaly: No Temperature / Pain Maceration: Yes Temperature: No Abnormality Tenderness on Palpation: Yes Wound Preparation Ulcer Cleansing: Rinsed/Irrigated with Saline Topical Anesthetic Applied: Other: lidocaine 4%, Treatment Notes Wound #3 (Right Gluteal fold) 1. Cleansed with: Clean wound with Normal Saline 2. Anesthetic Topical Lidocaine 4% cream to wound bed prior to debridement 8. Negative Pressure Wound Therapy Black Foam White Foam Number of foam/gauze pieces used in the dressing = Notes 1PIECES WHITE FOAM 1 PIECE BLACK FOAM TO BRIDGE Electronic Signature(s) Signed: 03/01/2017 4:45:11 PM By: Alric Quan Entered By: Alric Quan on 03/01/2017 08:59:11 [place], [name] ([id number]) -------------------------------------------------------------------------------- [place] Details Patient Name: [name] Date of Service: 03/01/2017 8:45 AM Medical Record Patient Account Number: [id number] Number: Treating RN: Ahmed Prima May 21, 1961 (56 y.o. Other Clinician: Date of Birth/Sex: Female) Treating ROBSON, MICHAEL Primary Care Riki Berninger: Lavera Guise Roshon Duell/Extender: G Referring Gissela Bloch: Sallee Lange in Treatment: 23 Vital Signs Time Taken: 08:51 Temperature (F): 98.4 Height (in): 63 Pulse  (bpm): 97 Weight (lbs): 257 Respiratory Rate (breaths/min): 18 Body Mass Index (BMI): 45.5 Blood Pressure (mmHg): 140/65 Reference Range: 80 - 120 mg / dl Electronic Signature(s) Signed: 03/01/2017 4:45:11 PM By: Alric Quan Entered By: Alric Quan on 03/01/2017 08:53:13

## 2017-03-03 NOTE — Progress Notes (Signed)
[name] ([id number]) Visit Report for 03/01/2017 Chief Complaint Document Details Patient Name: [name], [name] Date of Service: 03/01/2017 8:45 AM Medical Record Patient Account Number: [id number] Number: Treating RN: Phillis Haggis [birth year] (55 y.o. Other Clinician: Date of Birth/Sex: Female) Treating ROBSON, MICHAEL Primary Care Provider: Joen [name] Provider/Extender: G Referring Provider: Tedra Senegal in Treatment: 23 Information Obtained from: Patient Chief Complaint Ms. Girardin presents today for routine managament of bilateral ischial pressure ulcers and sinus tract to left popliteal fossa. Electronic Signature(s) Signed: 03/02/2017 5:01:50 PM By: Baltazar Najjar MD Entered By: Baltazar Najjar on 03/01/2017 09:16:02 Cahn, [name] ([id number]) -------------------------------------------------------------------------------- Debridement Details Patient Name: [name] Date of Service: 03/01/2017 8:45 AM Medical Record Patient Account Number: [id number] Number: Treating RN: Phillis Haggis 03/28/1961 (55 y.o. Other Clinician: Date of Birth/Sex: Female) Treating ROBSON, MICHAEL Primary Care Provider: Joen [name] Provider/Extender: G Referring Provider: Tedra Senegal in Treatment: 23 Debridement Performed for Wound #3 Right Gluteal fold Assessment: Performed By: Physician Maxwell Caul, MD Debridement: Debridement Pre-procedure Yes - 09:03 Verification/Time Out Taken: Start Time: 09:04 Pain Control: Lidocaine 4% Topical Solution Level: Skin/Subcutaneous Tissue Total Area Debrided (L x 1 (cm) x 1 (cm) = 1 (cm) W): Tissue and other Viable, Non-Viable, Exudate, Fibrin/Slough, Subcutaneous material debrided: Instrument: Other : scoop Specimen: Swab Number of Specimens 1 Taken: Bleeding: Minimum Hemostasis Achieved: Pressure End Time: 09:08 Procedural Pain: 0 Post Procedural Pain: 0 Response to Treatment: Procedure was  tolerated well Post Debridement Measurements of Total Wound Length: (cm) 1 Stage: Category/Stage IV Width: (cm) 1 Depth: (cm) 5.1 Volume: (cm) 4.006 Character of Wound/Ulcer Post Requires Further Debridement: Debridement Severity of Tissue Post Fat layer exposed Debridement: Post Procedure Diagnosis Same as Pre-procedure Beecham, Trella LMarland Kitchen ([id number]) Electronic Signature(s) Signed: 03/01/2017 4:45:11 PM By: Alejandro Mulling Signed: 03/02/2017 5:01:50 PM By: Baltazar Najjar MD Entered By: Baltazar Najjar on 03/01/2017 09:15:48 Chermak, [name] ([id number]) -------------------------------------------------------------------------------- HPI Details Patient Name: [name] Date of Service: 03/01/2017 8:45 AM Medical Record Patient Account Number: [id number] Number: Treating RN: Phillis Haggis 18-Jul-1961 (55 y.o. Other Clinician: Date of Birth/Sex: Female) Treating ROBSON, MICHAEL Primary Care Provider: Joen [name] Provider/Extender: G Referring Provider: Tedra Senegal in Treatment: 23 History of Present Illness Location: bilateral gluteal ulcerations and left popliteal ulceration with tunneling Quality: adits to intermittent aching to ischial ulcers, no discomfort to sinus tract Severity: right iscial ulcer with increased depth Duration: chronic ulcers to bilateral ischial ulcers and sinus tract Timing: The pain is intermittent in severity as far as how intense it becomes but is present all the time. Manipulationn makes this worse. Context: The wound occurred when the patient had a fall and was unconscious for about 48 hours laying on the floor and she had pressure injury at that stage. Modifying Factors: Other treatment(s) tried include:as noted below she has been seen by visiting wound care physicians or nurse practitioners and details have been noted Associated Signs and Symptoms: has yet to receive offloading cushions and mattress overlay HPI Description:  56 year old patient who was seen by visiting [name] wound care specialist for a wound on both her buttock and was found to have an unstageable wound on the right buttock for about 2 months. I understand that she had a fall and was laying on the floor for about 48 hours before she was found and taken to the ICU and had a long injury to her gluteal area from pressure and also had broken her right humerus.  She has had a right proximal humerus fracture and has been followed up with orthopedics recently. The patient has a past medical history of type 2 diabetes mellitus, paraparesis, acute pyelonephritis, GERD, hypertension, glaucoma, chronic pain, anxiety neurosis, nicotine dependence, COPD. the patient had some debridement done and was to operative was recommended to use Silvadene dressing and offloading. She is a smoker and occasionally smokes a few cigarettes. the patient requested a second opinion for months and is here to discuss her care. 09/21/16; the patient re-presents from home today for review of 3 different wounds. I note that she was seen in the clinic here in July at which time she had bilateral buttock wounds. It was apparently suggested at that time that she use a wound VAC bridged to both wounds just near the initial tuberosity's bilaterally which she refused. The history was a bit difficult to put together. Apparently this patient became ill at the end of April of this year. She was found sitting on the floor she had apparently been for 2 days and subsequently admitted to hospital from 04/23/16 through 05/02/16 and at that point she was critically ill ultimately having sepsis secondary to UTI, nontraumatic rhabdomyolysis and diabetic ketoacidosis. She had acute renal failure and I think required ICU care including intubation. Patient states her wounds actually started at that point on the bilateral issue tuberosities however in reviewing the discharge summary from 5/8 I see no reference  to wounds at that point. It did state that she had left lower extremity cellulitis however. Reviewing Epic I see no relevant x-rays. It would appear that her discharge creatinine was within the normal range and indeed on 9/15 her creatinine has remained normal. [facility] NOLAN, LASSER. ([id number]) rehabilitation. There the wounds on her bilateral Botox were dressed. Only just before her discharge from the nursing facility she developed an "knot" which was interpreted as cellulitis on the posterior aspect of her left knee she was given antibiotics. Apparently sometime late in July a this actually opened and became a wound at home health care was tending to however she is still having purulent drainage coming from this and by my understanding the wound depth is actually become unmeasurable. I am not really clear about what home health has been placing in any of these wound areas. The patient states that is something with silver and it. She is not been systemically unwell no fever or chills her appetite is good. She is a diabetic poorly controlled however she states that her recent blood sugars at home have been in the low to mid 100s. 09/28/16 On evaluation today patient appears to continue to exhibit the 3 areas of ulceration that were noted previous. She did have an x-ray of the right pelvis which showed evidence of potential soft tissue infection but no obvious osteomyelitis. There was a discussion last office visit concerning the possibility of a wound VAC. Witth that being said the x-ray report suggested that an MRI may be more appropriate to further evaluate the extent. Subsequently in regard to the wound over the popliteal portion of the left lower extremity with tunneling at 12:00 the CT scan that was ordered was denied by insurance as they state the patient has not had x-rays prior to advanced imaging. Patient states that she is frustrated with  the situation overall. 10/05/16 in the interval since I last saw this patient last week she has had the x-ray of the knee performed. I  did review that x-ray today and fortunately shows no evidence of osteomyelitis or other acute abnormality at this point in time. She continues to have the opening iin the posterior oral popliteal space with tracking proximal up the posterior thigh. Nothing seems to have worsened but it also seems to have not improved. The same is true in regard to the right pressure ulcer over the gluteal region which extends toward the ischium. The left gluteal pressure ulcer actually appears to be doing somewhat better my opinion there is some necrotic slough but overall this appears fairly well. She tells me thatt she has some discomfort especially when home health is helping her with dressing changes as they do not know her. At worse she rates her pain to be a 5 out of 10 right now it's more like a 1 out of 10. 10/07/16; still the patient has 3 different wound areas. She has a deep stage IV wound over her right ischial tuberosity. She is due to have an MRI next week. The wound over her left ischial tuberosity is more superficial and underwent debridement today. Finally she has a small open area in her left popliteal fossa the probes on measurably forward superiorly. Still a lot of drainage coming out of this. The CT scan that I ordered 3 weeks ago was questioned by her insurance company wanting a plain x-ray first. As I understand things result of this is nothing has been done in 3 weeks in terms of imaging the thigh and she has an MRI booked of this along with her pelvis for next week line 10/12/16; patient has a deep probing wound over the right ischiall tuberosity, stage III wound over the left visual tuberosity and a draining sinus in her left popliteal fossa. None of this much different from when I saw this 3 weeks ago. We have been using silver rope to the right ischial  wound and a draining area in the left popliteal fossa. Plain silver alginate to the area on the left ischial tuberosity 10/19/16; the patient's wounds are essentially unchanged although the area on the left lower gluteal is actually improved. Our intake nurse noted drainage from the right initial tuberosity probing wound as well as the draining area in the left popliteal fossa. Both of these were cultured. She had x-rays I think at the insistence of her insurance company on 09/23/16 x-ray of the pelvis was not particularly helpful she did have soft tissue air over the right lower pelvis although with the depth of this wound this is not surprising. An x-ray of her left knee did not show any specific abnormalities. We are still using silver alginate to these wound areas. Her MRI is booked for 10/27 10/26/16; cultures of the purulent drainage in her right initial tuberosity wound grew moderate Proteus and few staph aureus. The same organisms were cultured out of the left knee sinus tract posteriorly. The staph aureus is MRSA. I had started her on Augmentin last week I added doxycycline. The MRI of the left lower extremity and pelvis was finally done. The MRI of the femur showed subcutaneous soft tissue swelling Hollenbach, Rendy L. ([id number]) edema fluid and myositis in the vastus lateralis muscle but no soft tissue abscess septic arthritis or osteomyelitis. MRI of the pelvis showed the left wound to be more expensive extending down to the bone there was osteomyelitis. Left hamstring tendons were also involved. No septic arthritis involving the hip. The decubitus ulcer on the right side showed no definite osteomyelitis  or abscess.. The right hip wound is actually the one the probes 6 cm downward. But the MRI showing infection including osteomyelitis on the left explains the draining sinus in the popliteal fossa on the left. She did have antibiotics in the hospitalization last time and this extended into  her nursing home stay but I'm not exactly sure what antibiotics and for what duration. According the patient this did include vancomycin with considerable effort of our staff we are able to get the patient into see Dr. Sampson Goon today. There were transportation difficulties. Her mother had open heart surgery and is in the ICU in [place] therefore her brother was unable to transport. Dr. Jarrett Ables office graciously arranged time to see her today. From my point of view she is going to require IV vancomycin plus perhaps a third generation cephalosporin. I plan to keep her on doxycycline and Augmentin until the IV antibiotics can be arranged. 11/02/2016 - Satine presents today for management of ulcers; She saw Dr. 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 She is to have the PICC line placed today and receive the initial dose for both antibiotics today. She has yet to receive the offloading chair cushion and/or mattress overlay from home health, apparently this has been a 3 week process. I encouraged her to speak to home health regarding this matter, along with offering home health to contact the wouns care center with any questions or concerns. The left ischial pressure ulcer continues to imporve, while to right ischial ulcer has increased in depth. The popliteal fossa sinus tract remains unmeasurable due to the limitation of depth measurement (tract extends beyond our measuring devices). 11-16-2016 Ms. Henslee presents today for evaluation and management of bilateral ischial stage IV pressure ulcers and sinus tract to the left popliteal fossa. she is under the care of Dr. Sampson Goon for IV antibiotic therapy; she states that the vancomycin was placed on hold and will be restarted at a lower dose based on her renal function. She continues taking Zosyn in addition to the vancomycin. She also states that  she has yet to receive offloading cushions from home health, according to her she does not qualify for these offloading cushions because "the ulcers are unstageable ". We will contact the home health agency today to lend clarity regarding her pressure ulcers. The left popliteal fossa sinus tract continues to be a measurable as it extends beyond the length of our measuring devices.. 11/30/16; the patient is still on vancomycin and Zosyn. The depth of the draining sinus behind her left popliteal fossa is down to 4 cm although there is still serosanguineous drainage coming out of this. She saw Dr. Sampson Goon of infectious disease yesterday the idea is to weeks more of IV antibiotics and then oral antibiotics although I have not read his note. The area on the left gluteal fold is just about healed. She has a 6 cm draining sinus over the right initial tuberosity although I cannot feel bone at the base of this. As far as the patient is aware she has not had a recheck of her inflammatory markers. 12/07/16; patient is on vancomycin and Zosyn appointment with Dr. Sampson Goon on the 19th at which point the patient expects to have a change in antibiotics. Remarkable improvement over the wound over the left ischial tuberosity which is just about closed. The draining sinus in her popliteal fossa has 0.4 cm in depth. The area on the right  ischial tuberosity still probes down 7 cm. This is closed and overall wound dimensions but not depth. 12/14/16; patient is completing her vancomycin and Zosyn and per her she is going to transition to Bactrim and Augmentin for another 3 weeks. The area on her left gluteal fold is closed except for some skin tears. The area behind her left knee is no longer has any depth. The only remaining area that is of clinical concern is on the right gluteal fold probing towards the right ischial tuberosity. Today this measures 6.9 cm in depth. Very gritty surface 12/21/16; patient is now  on Bactrim and Augmentin as directed by infectious disease. This should be for another 2 weeks. The area in her left gluteal fold and left popliteal are closed over and fully healed. Bermea, Carisa L. ([id number]) Measurements today at 7 cm in the right buttock wound is unchanged from last week. 12/27/16; patient is on Bactrim and Augmentin for another week as directed by infectious disease. She has completed her IV antibiotics. She is not been systemically unwell no fever no chills. The area on her right buttock measured over 6 cm in depth. There is no palpable bone. No evidence of surrounding soft tissue infection. She is complaining of tongue irritation and has a history of thrush 01/04/17; patient is been back to see Dr. Sampson Goon, her Augmentin was stopped but he continued the Bactrim for another 3 weeks. Depth of the wound is 6.7 cm there is been no major change in either direction. She is not receive the wound VAC from home health I think because of confusion about who is supposed to provided will actually talk to the [facility] The net no major change. 01/18/17; patient obtained her wound VAC about 10 days ago however for some reason it was not actually put on the wound. She is therefore here for [place] to apply this I guess. No other issues are noted. She is not complaining of pain fever drainage 02/01/17; patient is here now having the wound VAC or 3-4 weeks to a deep pressure area over the right initial tuberosity. This measures 6 cm in depth today which is about half a centimeter better than 2 weeks ago. There is no evidence she is systemically unwell no fever no chills no pain around the area. 02/15/17; I follow this patient every 2 weeks for a deep area over the right ischial tuberosity. This measures 5.5 cm today which is a continued improvement of 1.2 cm from 1/10 and down 0.5 cm from her visit 2 weeks ago 03/01/17; continued difficult area over the right initial  tuberosity using the wound VAC. Depth today of 5.1 cm which is improved. Does not appear to be a lot of drainage in the canister. Antibiotics were finally stopped by Dr. Sampson Goon bactrim[]  and inflammatory markers have been repeated Electronic Signature(s) Signed: 03/02/2017 5:01:50 PM By: Baltazar Najjar MD Entered By: Baltazar Najjar on 03/01/2017 09:21:23 Gills, [name] ([id number]) -------------------------------------------------------------------------------- Physical Exam Details Patient Name: [name] Date of Service: 03/01/2017 8:45 AM Medical Record Patient Account Number: [id number] Number: Treating RN: Phillis Haggis February 14, 1961 (55 y.o. Other Clinician: Date of Birth/Sex: Female) Treating ROBSON, MICHAEL Primary Care Provider: Joen [name] Provider/Extender: G Referring Provider: Tedra Senegal in Treatment: 23 Constitutional Sitting or standing Blood Pressure is within target range for patient.. Pulse regular and within target range for patient.Marland Kitchen Respirations regular, non-labored and within target range.. Temperature is normal and within the target range for the  patient.. Patient's appearance is neat and clean. Appears in no acute distress. Well nourished and well developed.. Notes Wound exam; measuring 5.1 cm in depth which is a 0.4 cm improvement. Once again using a large open curet a large amount of nonviable material over the surface of this,. Attempting to remove his much as possible. Her intake nurse noted what looked to be purulent drainage therefore a culture done. There is no palpable bone Electronic Signature(s) Signed: 03/02/2017 5:01:50 PM By: Baltazar Najjar MD Entered By: Baltazar Najjar on 03/01/2017 09:18:47 [place], [name] ([id number]) -------------------------------------------------------------------------------- Physician Orders Details Patient Name: [name] Date of Service: 03/01/2017 8:45 AM Medical Record Patient Account  Number: [id number] Number: Treating RN: Phillis Haggis Mar 15, 1961 (55 y.o. Other Clinician: Date of Birth/Sex: Female) Treating ROBSON, MICHAEL Primary Care Provider: Joen [name] Provider/Extender: G Referring Provider: Tedra Senegal in Treatment: 44 Verbal / Phone Orders: Yes Clinician: Ashok Cordia, Debi Read Back and Verified: Yes Diagnosis Coding ICD-10 Coding Code Description L89.314 Pressure ulcer of right buttock, stage 4 E11.42 Type 2 diabetes mellitus with diabetic polyneuropathy Wound Cleansing Wound #3 Right Gluteal fold o Clean wound with Normal Saline. o Cleanse wound with mild soap and water Anesthetic Wound #3 Right Gluteal fold o Topical Lidocaine 4% cream applied to wound bed prior to debridement - clinic use only Skin Barriers/Peri-Wound Care Wound #3 Right Gluteal fold o Skin Prep Dressing Change Frequency Wound #3 Right Gluteal fold o Three times weekly - HHRN to change MONDAY, and FRIDAY and pt to come to wound clinic on [facility], every other week. Follow-up Appointments Wound #3 Right Gluteal fold o Return Appointment in 2 weeks. Off-Loading Wound #3 Right Gluteal fold o Roho cushion for wheelchair - *********[facility] to order*********** Home Health Wound #3 Right Gluteal fold Monrroy, Mirenda L. ([id number]) o Continue Home Health Visits - [facility] *****Please order [facility] cushion****** o Home Health Nurse may visit PRN to address patientos wound care needs. o FACE TO FACE ENCOUNTER: MEDICARE and MEDICAID PATIENTS: I certify that this patient is under my care and that I had a face-to-face encounter that meets the physician face-to-face encounter requirements with this patient on this date. The encounter with the patient was in whole or in part for the following MEDICAL CONDITION: (primary reason for Home Healthcare) MEDICAL NECESSITY: I certify, that based on my findings, NURSING services are a  medically necessary home health service. HOME BOUND STATUS: I certify that my clinical findings support that this patient is homebound (i.e., Due to illness or injury, pt requires aid of supportive devices such as crutches, cane, wheelchairs, walkers, the use of special transportation or the assistance of another person to leave their place of residence. There is a normal inability to leave the home and doing so requires considerable and taxing effort. Other absences are for medical reasons / religious services and are infrequent or of short duration when for other reasons). o If current dressing causes regression in wound condition, may D/C ordered dressing product/s and apply Normal Saline Moist Dressing daily until next Wound Healing Center / Other MD appointment. Notify Wound Healing Center of regression in wound condition at [phone]. o Please direct any NON-WOUND related issues/requests for orders to patient's Primary Care Physician Negative Pressure Wound Therapy Wound #3 Right Gluteal fold o Wound VAC settings at 125/130 mmHg continuous pressure. Use BLACK/GREEN foam to wound cavity. Use WHITE foam to fill any tunnel/s and/or undermining. Change VAC dressing 3 X WEEK. Change canister  as indicated when full. Nurse may titrate settings and frequency of dressing changes as clinically indicated. - Place white foam into wound in long strip and place where you can pull the white foam out. (WHITE FOAM ONLY IN THE WOUND) USE BLACK FOAM TO BRIDGE..... Please drape wound as according. Start now please if possible. o Home Health Nurse may d/c VAC for s/s of increased infection, significant wound regression, or uncontrolled drainage. Notify Wound Healing Center at [phone]. o Number of foam/gauze pieces used in the dressing = Medications-please add to medication list. Wound #3 Right Gluteal fold o P.O. Antibiotics - Bactrim and Augmentin o Other: - Vitamin C, Zinc,  Multivitamins IV antibiotics Laboratory o Bacteria identified in Wound by Culture (MICRO) oooo LOINC Code: [id number] oooo Convenience Name: Wound culture routine Electronic Signature(s) AYALA, RIBBLE ([id number]) Signed: 03/01/2017 4:45:11 PM By: Alejandro Mulling Signed: 03/02/2017 5:01:50 PM By: Baltazar Najjar MD Entered By: Alejandro Mulling on 03/01/2017 09:35:03 Kapuscinski, [name] ([id number]) -------------------------------------------------------------------------------- Problem List Details Patient Name: [name] Date of Service: 03/01/2017 8:45 AM Medical Record Patient Account Number: [id number] Number: Treating RN: Phillis Haggis 07-28-1961 (55 y.o. 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 09/21/2016 09/21/2016 severity Resolved Problems ICD-10 Code Description Active Date Resolved Date L89.323 Pressure ulcer of left buttock, stage 3 09/21/2016 09/21/2016 Electronic Signature(s) Signed: 03/02/2017 5:01:50 PM By: Baltazar Najjar MD Hino, [name] ([id number]) Entered By: Baltazar Najjar on 03/01/2017 09:12:22 Sermons, [name] ([id number]) -------------------------------------------------------------------------------- Progress Note Details Patient Name: [name] Date of Service: 03/01/2017 8:45 AM Medical Record Patient Account Number:  [id number] Number: Treating RN: Phillis Haggis August 26, 1961 (55 y.o. Other Clinician: Date of Birth/Sex: Female) Treating ROBSON, MICHAEL Primary Care Provider: Joen [name] Provider/Extender: G Referring Provider: Tedra Senegal in Treatment: 23 Subjective Chief Complaint Information obtained from Patient Ms. Cheetham presents today for routine managament of bilateral ischial pressure ulcers and sinus tract to left popliteal fossa. History of Present Illness (HPI) The following HPI elements were documented for the patient's wound: Location: bilateral gluteal ulcerations and left popliteal ulceration with tunneling Quality: adits to intermittent aching to ischial ulcers, no discomfort to sinus tract Severity: right iscial ulcer with increased depth Duration: chronic ulcers to bilateral ischial ulcers and sinus tract Timing: The pain is intermittent in severity as far as how intense it becomes but is present all the time. Manipulationn makes this worse. Context: The wound occurred when the patient had a fall and was unconscious for about 48 hours laying on the floor and she had pressure injury at that stage. Modifying Factors: Other treatment(s) tried include:as noted below she has been seen by visiting wound care physicians or nurse practitioners and details have been noted Associated Signs and Symptoms: has yet to receive offloading cushions and mattress overlay 56 year old patient who was seen by visiting [name] wound care specialist for a wound on both her buttock and was found to have an unstageable wound on the right buttock for about 2 months. I understand that she had  a fall and was laying on the floor for about 48 hours before she was found and taken to the ICU and had a long injury to her gluteal area from pressure and also had broken her right humerus. She has had a right proximal humerus fracture and has been followed up with orthopedics recently. The patient has a  past medical history of type 2 diabetes mellitus, paraparesis, acute pyelonephritis, GERD, hypertension, glaucoma, chronic pain, anxiety neurosis, nicotine dependence, COPD. the patient had some debridement done and was to operative was recommended to use Silvadene dressing and offloading. She is a smoker and occasionally smokes a few cigarettes. the patient requested a second opinion for months and is here to discuss her care. 09/21/16; the patient re-presents from home today for review of 3 different wounds. I note that she was seen in the clinic here in July at which time she had bilateral buttock wounds. It was apparently suggested at that time that she use a wound VAC bridged to both wounds just near the initial tuberosity's bilaterally which she refused. Landress, Gustavo L. ([id number]) The history was a bit difficult to put together. Apparently this patient became ill at the end of April of this year. She was found sitting on the floor she had apparently been for 2 days and subsequently admitted to hospital from 04/23/16 through 05/02/16 and at that point she was critically ill ultimately having sepsis secondary to UTI, nontraumatic rhabdomyolysis and diabetic ketoacidosis. She had acute renal failure and I think required ICU care including intubation. Patient states her wounds actually started at that point on the bilateral issue tuberosities however in reviewing the discharge summary from 5/8 I see no reference to wounds at that point. It did state that she had left lower extremity cellulitis however. Reviewing Epic I see no relevant x-rays. It would appear that her discharge creatinine was within the normal range and indeed on 9/15 her creatinine has remained normal. [facility] There the wounds on her bilateral Botox were dressed. Only just before her discharge from the nursing facility she developed an "knot" which was interpreted as  cellulitis on the posterior aspect of her left knee she was given antibiotics. Apparently sometime late in July a this actually opened and became a wound at home health care was tending to however she is still having purulent drainage coming from this and by my understanding the wound depth is actually become unmeasurable. I am not really clear about what home health has been placing in any of these wound areas. The patient states that is something with silver and it. She is not been systemically unwell no fever or chills her appetite is good. She is a diabetic poorly controlled however she states that her recent blood sugars at home have been in the low to mid 100s. 09/28/16 On evaluation today patient appears to continue to exhibit the 3 areas of ulceration that were noted previous. She did have an x-ray of the right pelvis which showed evidence of potential soft tissue infection but no obvious osteomyelitis. There was a discussion last office visit concerning the possibility of a wound VAC. Witth that being said the x-ray report suggested that an MRI may be more appropriate to further evaluate the extent. Subsequently in regard to the wound over the popliteal portion of the left lower extremity with tunneling at 12:00 the CT scan that was ordered was denied by insurance as they state the patient has  not had x-rays prior to advanced imaging. Patient states that she is frustrated with the situation overall. 10/05/16 in the interval since I last saw this patient last week she has had the x-ray of the knee performed. I did review that x-ray today and fortunately shows no evidence of osteomyelitis or other acute abnormality at this point in time. She continues to have the opening iin the posterior oral popliteal space with tracking proximal up the posterior thigh. Nothing seems to have worsened but it also seems to have not improved. The same is true in regard to the right pressure ulcer over the  gluteal region which extends toward the ischium. The left gluteal pressure ulcer actually appears to be doing somewhat better my opinion there is some necrotic slough but overall this appears fairly well. She tells me thatt she has some discomfort especially when home health is helping her with dressing changes as they do not know her. At worse she rates her pain to be a 5 out of 10 right now it's more like a 1 out of 10. 10/07/16; still the patient has 3 different wound areas. She has a deep stage IV wound over her right ischial tuberosity. She is due to have an MRI next week. The wound over her left ischial tuberosity is more superficial and underwent debridement today. Finally she has a small open area in her left popliteal fossa the probes on measurably forward superiorly. Still a lot of drainage coming out of this. The CT scan that I ordered 3 weeks ago was questioned by her insurance company wanting a plain x-ray first. As I understand things result of this is nothing has been done in 3 weeks in terms of imaging the thigh and she has an MRI booked of this along with her pelvis for next week line 10/12/16; patient has a deep probing wound over the right ischiall tuberosity, stage III wound over the left visual tuberosity and a draining sinus in her left popliteal fossa. None of this much different from when I saw this 3 weeks ago. We have been using silver rope to the right ischial wound and a draining area in the left popliteal fossa. Plain silver alginate to the area on the left ischial tuberosity 10/19/16; the patient's wounds are essentially unchanged although the area on the left lower gluteal is actually improved. Our intake nurse noted drainage from the right initial tuberosity probing wound as well as Casseus, Severina L. ([id number]) the draining area in the left popliteal fossa. Both of these were cultured. She had x-rays I think at the insistence of her insurance company on 09/23/16 x-ray  of the pelvis was not particularly helpful she did have soft tissue air over the right lower pelvis although with the depth of this wound this is not surprising. An x-ray of her left knee did not show any specific abnormalities. We are still using silver alginate to these wound areas. Her MRI is booked for 10/27 10/26/16; cultures of the purulent drainage in her right initial tuberosity wound grew moderate Proteus and few staph aureus. The same organisms were cultured out of the left knee sinus tract posteriorly. The staph aureus is MRSA. I had started her on Augmentin last week I added doxycycline. The MRI of the left lower extremity and pelvis was finally done. The MRI of the femur showed subcutaneous soft tissue swelling edema fluid and myositis in the vastus lateralis muscle but no soft tissue abscess septic arthritis or osteomyelitis. MRI of the  pelvis showed the left wound to be more expensive extending down to the bone there was osteomyelitis. Left hamstring tendons were also involved. No septic arthritis involving the hip. The decubitus ulcer on the right side showed no definite osteomyelitis or abscess.. The right hip wound is actually the one the probes 6 cm downward. But the MRI showing infection including osteomyelitis on the left explains the draining sinus in the popliteal fossa on the left. She did have antibiotics in the hospitalization last time and this extended into her nursing home stay but I'm not exactly sure what antibiotics and for what duration. According the patient this did include vancomycin with considerable effort of our staff we are able to get the patient into see Dr. Sampson Goon today. There were transportation difficulties. Her mother had open heart surgery and is in the ICU in [place] therefore her brother was unable to transport. Dr. Jarrett Ables office graciously arranged time to see her today. From my point of view she is going to require IV vancomycin plus  perhaps a third generation cephalosporin. I plan to keep her on doxycycline and Augmentin until the IV antibiotics can be arranged. 11/02/2016 - Bluma presents today for management of ulcers; She saw Dr. 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 She is to have the PICC line placed today and receive the initial dose for both antibiotics today. She has yet to receive the offloading chair cushion and/or mattress overlay from home health, apparently this has been a 3 week process. I encouraged her to speak to home health regarding this matter, along with offering home health to contact the wouns care center with any questions or concerns. The left ischial pressure ulcer continues to imporve, while to right ischial ulcer has increased in depth. The popliteal fossa sinus tract remains unmeasurable due to the limitation of depth measurement (tract extends beyond our measuring devices). 11-16-2016 Ms. Wuellner presents today for evaluation and management of bilateral ischial stage IV pressure ulcers and sinus tract to the left popliteal fossa. she is under the care of Dr. Sampson Goon for IV antibiotic therapy; she states that the vancomycin was placed on hold and will be restarted at a lower dose based on her renal function. She continues taking Zosyn in addition to the vancomycin. She also states that she has yet to receive offloading cushions from home health, according to her she does not qualify for these offloading cushions because "the ulcers are unstageable ". We will contact the home health agency today to lend clarity regarding her pressure ulcers. The left popliteal fossa sinus tract continues to be a measurable as it extends beyond the length of our measuring devices.. 11/30/16; the patient is still on vancomycin and Zosyn. The depth of the draining sinus behind her left popliteal fossa is down to 4 cm  although there is still serosanguineous drainage coming out of this. She saw Dr. Sampson Goon of infectious disease yesterday the idea is to weeks more of IV antibiotics and then oral antibiotics although I have not read his note. The area on the left gluteal fold is just about healed. She has a 6 cm draining sinus over the right initial tuberosity although I cannot feel bone at the base of this. As far as the patient is aware she has not had a recheck of her inflammatory markers. 12/07/16; patient is on vancomycin and Zosyn appointment with Dr. Sampson Goon on the 19th at which point the  patient expects to have a change in antibiotics. Remarkable improvement over the wound over the left ischial tuberosity which is just about closed. The draining sinus in her popliteal fossa has 0.4 cm in depth. Doubleday, Matilda L. ([id number]) The area on the right ischial tuberosity still probes down 7 cm. This is closed and overall wound dimensions but not depth. 12/14/16; patient is completing her vancomycin and Zosyn and per her she is going to transition to Bactrim and Augmentin for another 3 weeks. The area on her left gluteal fold is closed except for some skin tears. The area behind her left knee is no longer has any depth. The only remaining area that is of clinical concern is on the right gluteal fold probing towards the right ischial tuberosity. Today this measures 6.9 cm in depth. Very gritty surface 12/21/16; patient is now on Bactrim and Augmentin as directed by infectious disease. This should be for another 2 weeks. The area in her left gluteal fold and left popliteal are closed over and fully healed. Measurements today at 7 cm in the right buttock wound is unchanged from last week. 12/27/16; patient is on Bactrim and Augmentin for another week as directed by infectious disease. She has completed her IV antibiotics. She is not been systemically unwell no fever no chills. The area on her right buttock  measured over 6 cm in depth. There is no palpable bone. No evidence of surrounding soft tissue infection. She is complaining of tongue irritation and has a history of thrush 01/04/17; patient is been back to see Dr. Sampson Goon, her Augmentin was stopped but he continued the Bactrim for another 3 weeks. Depth of the wound is 6.7 cm there is been no major change in either direction. She is not receive the wound VAC from home health I think because of confusion about who is supposed to provided will actually talk to the [facility] health agency today [facility] The net no major change. 01/18/17; patient obtained her wound VAC about 10 days ago however for some reason it was not actually put on the wound. She is therefore here for [place] to apply this I guess. No other issues are noted. She is not complaining of pain fever drainage 02/01/17; patient is here now having the wound VAC or 3-4 weeks to a deep pressure area over the right initial tuberosity. This measures 6 cm in depth today which is about half a centimeter better than 2 weeks ago. There is no evidence she is systemically unwell no fever no chills no pain around the area. 02/15/17; I follow this patient every 2 weeks for a deep area over the right ischial tuberosity. This measures 5.5 cm today which is a continued improvement of 1.2 cm from 1/10 and down 0.5 cm from her visit 2 weeks ago 03/01/17; continued difficult area over the right initial tuberosity using the wound VAC. Depth today of 5.1 cm which is improved. Does not appear to be a lot of drainage in the canister. Antibiotics were finally stopped by Dr. Sampson Goon bactrim[]  and inflammatory markers have been repeated Objective Constitutional Sitting or standing Blood Pressure is within target range for patient.. Pulse regular and within target range for patient.Marland Kitchen Respirations regular, non-labored and within target range.. Temperature is normal and within the target range for the patient..  Patient's appearance is neat and clean. Appears in no acute distress. Well nourished and well developed.. Vitals Time Taken: 8:51 AM, Height: 63 in, Weight: 257 lbs, BMI: 45.5, Temperature: 98.4 F, Pulse:  97 bpm, Respiratory Rate: 18 breaths/min, Blood Pressure: 140/65 mmHg. General Notes: Wound exam; measuring 5.1 cm in depth which is a 0.4 cm improvement. Once again using Sachdeva, Calena L. ([id number]) a large open curet a large amount of nonviable material over the surface of this,. Attempting to remove his much as possible. Her intake nurse noted what looked to be purulent drainage therefore a culture done. There is no palpable bone Integumentary (Hair, Skin) Wound #3 status is Open. Original cause of wound was Pressure Injury. The wound is located on the Right Gluteal fold. The wound measures 1cm length x 1cm width x 5.1cm depth; 0.785cm^2 area and 4.006cm^3 volume. There is Fat Layer (Subcutaneous Tissue) Exposed exposed. There is no tunneling or undermining noted. There is a large amount of purulent drainage noted. The wound margin is distinct with the outline attached to the wound base. There is medium (34-66%) red granulation within the wound bed. There is a medium (34-66%) amount of necrotic tissue within the wound bed including Adherent Slough. The periwound skin appearance exhibited: Scarring, Maceration. The periwound skin appearance did not exhibit: Callus, Crepitus, Excoriation, Induration, Rash, Dry/Scaly, Atrophie Blanche, Cyanosis, Ecchymosis, Hemosiderin Staining, Mottled, Pallor, Rubor, Erythema. Periwound temperature was noted as No Abnormality. The periwound has tenderness on palpation. 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 There was a Skin/Subcutaneous Tissue Debridement  ([id number]) debridement with total area of 1 sq cm performed by Maxwell Caul, MD. with the following instrument(s): scoop to remove Viable and Non-Viable tissue/material including Exudate, Fibrin/Slough, and Subcutaneous after achieving pain control using Lidocaine 4% Topical Solution. 1 Specimen was taken by a Swab and sent to the lab per facility protocol.A time out was conducted at 09:03, prior to the start of the procedure. A Minimum amount of bleeding was controlled with Pressure. The procedure was tolerated well with a pain level of 0 throughout and a pain level of 0 following the procedure. Post Debridement Measurements: 1cm length x 1cm width x 5.1cm depth; 4.006cm^3 volume. Post debridement Stage noted as Category/Stage IV. Character of Wound/Ulcer Post Debridement requires further debridement. Severity of Tissue Post Debridement is: Fat layer exposed. Post procedure Diagnosis Wound #3: Same as Pre-Procedure Troost, Denell L. ([id number]) Plan Wound Cleansing: Wound #3 Right Gluteal fold: Clean wound with Normal Saline. Cleanse wound with mild soap and water Anesthetic: Wound #3 Right Gluteal fold: Topical Lidocaine 4% cream applied to wound bed prior to debridement - clinic use only Skin Barriers/Peri-Wound Care: Wound #3 Right Gluteal fold: Skin Prep Dressing Change Frequency: Wound #3 Right Gluteal fold: Three times weekly - HHRN to change MONDAY, and FRIDAY and pt to come to wound clinic on [facility], every other week. Follow-up Appointments: Wound #3 Right Gluteal fold: Return Appointment in 2 weeks. Off-Loading: Wound #3 Right Gluteal fold: Roho cushion for wheelchair - *********[facility] to order*********** Home Health: Wound #3 Right Gluteal fold: Continue Home Health Visits - [facility] *****Please order [facility] cushion****** Home Health Nurse may visit PRN to address patient s wound care needs. FACE TO FACE ENCOUNTER: MEDICARE and MEDICAID  PATIENTS: I certify that this patient is under my care and that I had a face-to-face encounter that meets the physician face-to-face encounter requirements with this patient on this date. The encounter with the patient was in whole or  in part for the following MEDICAL CONDITION: (primary reason for Home Healthcare) MEDICAL NECESSITY: I certify, that based on my findings, NURSING services are a medically necessary home health service. HOME BOUND STATUS: I certify that my clinical findings support that this patient is homebound (i.e., Due to illness or injury, pt requires aid of supportive devices such as crutches, cane, wheelchairs, walkers, the use of special transportation or the assistance of another person to leave their place of residence. There is a normal inability to leave the home and doing so requires considerable and taxing effort. Other absences are for medical reasons / religious services and are infrequent or of short duration when for other reasons). If current dressing causes regression in wound condition, may D/C ordered dressing product/s and apply Normal Saline Moist Dressing daily until next Wound Healing Center / Other MD appointment. Notify Wound Healing Center of regression in wound condition at [phone]. Please direct any NON-WOUND related issues/requests for orders to patient's Primary Care Physician Negative Pressure Wound Therapy: Wound #3 Right Gluteal fold: Wound VAC settings at 125/130 mmHg continuous pressure. Use BLACK/GREEN foam to wound cavity. Use WHITE foam to fill any tunnel/s and/or undermining. Change VAC dressing 3 X WEEK. Change canister as indicated when full. Nurse may titrate settings and frequency of dressing changes as clinically indicated. - Place white foam into wound in long strip and place where you can pull the white foam out. (WHITE FOAM ONLY IN THE WOUND) USE BLACK FOAM TO BRIDGE..... Please drape wound as Deak, Briyana L.  ([id number]) according. Start now please if possible. Home Health Nurse may d/c VAC for s/s of increased infection, significant wound regression, or uncontrolled drainage. Notify Wound Healing Center at [phone]. Number of foam/gauze pieces used in the dressing = Medications-please add to medication list.: Wound #3 Right Gluteal fold: P.O. Antibiotics - Bactrim and Augmentin Other: - Vitamin C, Zinc, Multivitamins IV antibiotics Laboratory ordered were: Wound culture routine #1 we continue with the wound VAC #2 culture done but no emperic Antibiotics. Her antibiotics were recently discontinued by Dr. Sampson Goon #3 wound seems to be coming down in depth albeit very slowly. #4 follow-up in 2 weeks Electronic Signature(s) Signed: 03/02/2017 3:14:45 PM By: Elliot Gurney RN, BSN, Kim RN, BSN Signed: 03/02/2017 5:01:50 PM By: Baltazar Najjar MD Previous Signature: 03/01/2017 9:22:31 AM Version By: Baltazar Najjar MD Entered By: Elliot Gurney RN, BSN, Kim on 03/02/2017 15:14:44 Chestang, [name] ([id number]) -------------------------------------------------------------------------------- SuperBill Details Patient Name: [name] Date of Service: 03/01/2017 Medical Record Patient Account Number: [id number] Number: Treating RN: Phillis Haggis April 20, 1961 (55 y.o. 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

## 2017-03-05 LAB — AEROBIC CULTURE  (SUPERFICIAL SPECIMEN)

## 2017-03-05 LAB — AEROBIC CULTURE W GRAM STAIN (SUPERFICIAL SPECIMEN)

## 2017-03-15 ENCOUNTER — Encounter: Payer: Managed Care, Other (non HMO) | Admitting: Internal Medicine

## 2017-03-15 DIAGNOSIS — L89314 Pressure ulcer of right buttock, stage 4: Secondary | ICD-10-CM | POA: Diagnosis not present

## 2017-03-16 NOTE — Progress Notes (Signed)
[name], [name] ([id number]) Visit Report for 03/15/2017 Chief Complaint Document Details Patient Name: [name] Date of Service: 03/15/2017 8:45 AM Medical Record Patient Account Number: [id number] Number: Treating RN: Phillis Haggis January 07, 1961 (55 y.o. Other Clinician: Date of Birth/Sex: Female) Treating Timmie Calix Primary Care Provider: Joen [name] Provider/Extender: G Referring Provider: Tedra Senegal in Treatment: 25 Information Obtained from: Patient Chief Complaint Ms. Barsanti presents today for routine managament of bilateral ischial pressure ulcers and sinus tract to left popliteal fossa. Electronic Signature(s) Signed: 03/15/2017 4:37:19 PM By: Baltazar Najjar MD Entered By: Baltazar Najjar on 03/15/2017 09:15:31 [name], [name] -------------------------------------------------------------------------------- HPI Details Patient Name: [name] Date of Service: 03/15/2017 8:45 AM Medical Record Patient Account Number: [id number] Number: Treating RN: Phillis Haggis [birth year] (55 y.o. Other Clinician: Date of Birth/Sex: Female) Treating Viyaan Champine Primary Care Provider: Joen [name] Provider/Extender: G Referring Provider: Tedra Senegal in Treatment: 25 History of Present Illness Location: bilateral gluteal ulcerations and left popliteal ulceration with tunneling Quality: adits to intermittent aching to ischial ulcers, no discomfort to sinus tract Severity: right iscial ulcer with increased depth Duration: chronic ulcers to bilateral ischial ulcers and sinus tract Timing: The pain is intermittent in severity as far as how intense it becomes but is present all the time. Manipulationn makes this worse. Context: The wound occurred when the patient had a fall and was unconscious for about 48 hours laying on the floor and she had pressure injury at that stage. Modifying Factors: Other treatment(s) tried include:as  noted below she has been seen by visiting wound care physicians or nurse practitioners and details have been noted Associated Signs and Symptoms: has yet to receive offloading cushions and mattress overlay HPI Description: 56 year old patient who was seen by visiting [name] wound care specialist for a wound on both her buttock and was found to have an unstageable wound on the right buttock for about 2 months. I understand that she had a fall and was laying on the floor for about 48 hours before she was found and taken to the ICU and had a long injury to her gluteal area from pressure and also had broken her right humerus. She has had a right proximal humerus fracture and has been followed up with orthopedics recently. The patient has a past medical history of type 2 diabetes mellitus, paraparesis, acute pyelonephritis, GERD, hypertension, glaucoma, chronic pain, anxiety neurosis, nicotine dependence, COPD. the patient had some debridement done and was to operative was recommended to use Silvadene dressing and offloading. She is a smoker and occasionally smokes a few cigarettes. the patient requested a second opinion for months and is here to discuss her care. 09/21/16; the patient re-presents from home today for review of 3 different wounds. I note that she was seen in the clinic here in July at which time she had bilateral buttock wounds. It was apparently suggested at that time that she use a wound VAC bridged to both wounds just near the initial tuberosity's bilaterally which she refused. The history was a bit difficult to put together. Apparently this patient became ill at the end of April of this year. She was found sitting on the floor she had apparently been for 2 days and subsequently admitted to hospital from 04/23/16 through 05/02/16 and at that point she was critically ill ultimately having sepsis secondary to UTI, nontraumatic rhabdomyolysis and diabetic ketoacidosis. She had acute renal  failure and I think required ICU care including intubation. Patient states  her wounds actually started at that point on the bilateral issue tuberosities however in reviewing the discharge summary from 5/8 I see no reference to wounds at that point. It did state that she had left lower extremity cellulitis however. Reviewing Epic I see no relevant x-rays. It would appear that her discharge creatinine was within the normal range and indeed on 9/15 her creatinine has remained normal. [name]. ([id number]) rehabilitation. There the wounds on her bilateral Botox were dressed. Only just before her discharge from the nursing facility she developed an "knot" which was interpreted as cellulitis on the posterior aspect of her left knee she was given antibiotics. Apparently sometime late in July a this actually opened and became a wound at home health care was tending to however she is still having purulent drainage coming from this and by my understanding the wound depth is actually become unmeasurable. I am not really clear about what home health has been placing in any of these wound areas. The patient states that is something with silver and it. She is not been systemically unwell no fever or chills her appetite is good. She is a diabetic poorly controlled however she states that her recent blood sugars at home have been in the low to mid 100s. 09/28/16 On evaluation today patient appears to continue to exhibit the 3 areas of ulceration that were noted previous. She did have an x-ray of the right pelvis which showed evidence of potential soft tissue infection but no obvious osteomyelitis. There was a discussion last office visit concerning the possibility of a wound VAC. Witth that being said the x-ray report suggested that an MRI may be more appropriate to further evaluate the extent. Subsequently in regard to the wound over the popliteal  portion of the left lower extremity with tunneling at 12:00 the CT scan that was ordered was denied by insurance as they state the patient has not had x-rays prior to advanced imaging. Patient states that she is frustrated with the situation overall. 10/05/16 in the interval since I last saw this patient last week she has had the x-ray of the knee performed. I did review that x-ray today and fortunately shows no evidence of osteomyelitis or other acute abnormality at this point in time. She continues to have the opening iin the posterior oral popliteal space with tracking proximal up the posterior thigh. Nothing seems to have worsened but it also seems to have not improved. The same is true in regard to the right pressure ulcer over the gluteal region which extends toward the ischium. The left gluteal pressure ulcer actually appears to be doing somewhat better my opinion there is some necrotic slough but overall this appears fairly well. She tells me thatt she has some discomfort especially when home health is helping her with dressing changes as they do not know her. At worse she rates her pain to be a 5 out of 10 right now it's more like a 1 out of 10. 10/07/16; still the patient has 3 different wound areas. She has a deep stage IV wound over her right ischial tuberosity. She is due to have an MRI next week. The wound over her left ischial tuberosity is more superficial and underwent debridement today. Finally she has a small open area in her left popliteal fossa the probes on measurably forward superiorly. Still a lot of drainage coming out of this. The CT scan that I ordered 3  weeks ago was questioned by her insurance company wanting a plain x-ray first. As I understand things result of this is nothing has been done in 3 weeks in terms of imaging the thigh and she has an MRI booked of this along with her pelvis for next week line 10/12/16; patient has a deep probing wound over the right  ischiall tuberosity, stage III wound over the left visual tuberosity and a draining sinus in her left popliteal fossa. None of this much different from when I saw this 3 weeks ago. We have been using silver rope to the right ischial wound and a draining area in the left popliteal fossa. Plain silver alginate to the area on the left ischial tuberosity 10/19/16; the patient's wounds are essentially unchanged although the area on the left lower gluteal is actually improved. Our intake nurse noted drainage from the right initial tuberosity probing wound as well as the draining area in the left popliteal fossa. Both of these were cultured. She had x-rays I think at the insistence of her insurance company on 09/23/16 x-ray of the pelvis was not particularly helpful she did have soft tissue air over the right lower pelvis although with the depth of this wound this is not surprising. An x-ray of her left knee did not show any specific abnormalities. We are still using silver alginate to these wound areas. Her MRI is booked for 10/27 10/26/16; cultures of the purulent drainage in her right initial tuberosity wound grew moderate Proteus and few staph aureus. The same organisms were cultured out of the left knee sinus tract posteriorly. The staph aureus is MRSA. I had started her on Augmentin last week I added doxycycline. The MRI of the left lower extremity and pelvis was finally done. The MRI of the femur showed subcutaneous soft tissue swelling [name], [name] ([id number]) edema fluid and myositis in the vastus lateralis muscle but no soft tissue abscess septic arthritis or osteomyelitis. MRI of the pelvis showed the left wound to be more expensive extending down to the bone there was osteomyelitis. Left hamstring tendons were also involved. No septic arthritis involving the hip. The decubitus ulcer on the right side showed no definite osteomyelitis or abscess.. The right hip wound is actually the one the  probes 6 cm downward. But the MRI showing infection including osteomyelitis on the left explains the draining sinus in the popliteal fossa on the left. She did have antibiotics in the hospitalization last time and this extended into her nursing home stay but I'm not exactly sure what antibiotics and for what duration. According the patient this did include vancomycin with considerable effort of our staff we are able to get the patient into see Dr. Sampson Goon today. There were transportation difficulties. Her mother had open heart surgery and is in the ICU in [place] therefore her brother was unable to transport. Dr. Jarrett Ables office graciously arranged time to see her today. From my point of view she is going to require IV vancomycin plus perhaps a third generation cephalosporin. I plan to keep her on doxycycline and Augmentin until the IV antibiotics can be arranged. 11/02/2016 - Zarahi presents today for management of ulcers; She saw Dr. 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 She is to have the PICC line placed today and receive the initial dose for both antibiotics today. She has yet to receive the offloading chair cushion and/or mattress overlay from home health, apparently  this has been a 3 week process. I encouraged her to speak to home health regarding this matter, along with offering home health to contact the wouns care center with any questions or concerns. The left ischial pressure ulcer continues to imporve, while to right ischial ulcer has increased in depth. The popliteal fossa sinus tract remains unmeasurable due to the limitation of depth measurement (tract extends beyond our measuring devices). 11-16-2016 Ms. Olivera presents today for evaluation and management of bilateral ischial stage IV pressure ulcers and sinus tract to the left popliteal fossa. she is under the care of Dr.  Sampson Goon for IV antibiotic therapy; she states that the vancomycin was placed on hold and will be restarted at a lower dose based on her renal function. She continues taking Zosyn in addition to the vancomycin. She also states that she has yet to receive offloading cushions from home health, according to her she does not qualify for these offloading cushions because "the ulcers are unstageable ". We will contact the home health agency today to lend clarity regarding her pressure ulcers. The left popliteal fossa sinus tract continues to be a measurable as it extends beyond the length of our measuring devices.. 11/30/16; the patient is still on vancomycin and Zosyn. The depth of the draining sinus behind her left popliteal fossa is down to 4 cm although there is still serosanguineous drainage coming out of this. She saw Dr. Sampson Goon of infectious disease yesterday the idea is to weeks more of IV antibiotics and then oral antibiotics although I have not read his note. The area on the left gluteal fold is just about healed. She has a 6 cm draining sinus over the right initial tuberosity although I cannot feel bone at the base of this. As far as the patient is aware she has not had a recheck of her inflammatory markers. 12/07/16; patient is on vancomycin and Zosyn appointment with Dr. Sampson Goon on the 19th at which point the patient expects to have a change in antibiotics. Remarkable improvement over the wound over the left ischial tuberosity which is just about closed. The draining sinus in her popliteal fossa has 0.4 cm in depth. The area on the right ischial tuberosity still probes down 7 cm. This is closed and overall wound dimensions but not depth. 12/14/16; patient is completing her vancomycin and Zosyn and per her she is going to transition to Bactrim and Augmentin for another 3 weeks. The area on her left gluteal fold is closed except for some skin tears. The area behind her left knee is  no longer has any depth. The only remaining area that is of clinical concern is on the right gluteal fold probing towards the right ischial tuberosity. Today this measures 6.9 cm in depth. Very gritty surface 12/21/16; patient is now on Bactrim and Augmentin as directed by infectious disease. This should be for another 2 weeks. The area in her left gluteal fold and left popliteal are closed over and fully healed. [name], [name] ([id number]) Measurements today at 7 cm in the right buttock wound is unchanged from last week. 12/27/16; patient is on Bactrim and Augmentin for another week as directed by infectious disease. She has completed her IV antibiotics. She is not been systemically unwell no fever no chills. The area on her right buttock measured over 6 cm in depth. There is no palpable bone. No evidence of surrounding soft tissue infection. She is complaining of tongue irritation and has a history of thrush 01/04/17; patient  is been back to see Dr. Sampson Goon, her Augmentin was stopped but he continued the Bactrim for another 3 weeks. Depth of the wound is 6.7 cm there is been no major change in either direction. She is not receive the wound VAC from home health I think because of confusion about who is supposed to provided will actually talk to the [facility] agency [facility] The net no major change. 01/18/17; patient obtained her wound VAC about 10 days ago however for some reason it was not actually put on the wound. She is therefore here for [place] to apply this I guess. No other issues are noted. She is not complaining of pain fever drainage 02/01/17; patient is here now having the wound VAC or 3-4 weeks to a deep pressure area over the right initial tuberosity. This measures 6 cm in depth today which is about half a centimeter better than 2 weeks ago. There is no evidence she is systemically unwell no fever no chills no pain around the area. 02/15/17; I follow this patient every 2 weeks for  a deep area over the right ischial tuberosity. This measures 5.5 cm today which is a continued improvement of 1.2 cm from 1/10 and down 0.5 cm from her visit 2 weeks ago 03/01/17; continued difficult area over the right initial tuberosity using the wound VAC. Depth today of 5.1 cm which is improved. Does not appear to be a lot of drainage in the canister. Antibiotics were finally stopped by Dr. Sampson Goon bactrim[]  and inflammatory markers have been repeated 03/15/17; fall this lady every 2 weeks for a difficult area over her lower right gluteal area/ischial tuberosity. Depth today at 4.6 cm. This is a slow but steady improvement in the depth of this wound. Although we have labeled this as a pressure ulcer there may have been an underlying infection here at one point before we saw her. She had osteomyelitis on the left extensively which is since resolved Electronic Signature(s) Signed: 03/15/2017 4:37:19 PM By: Baltazar Najjar MD Entered By: Baltazar Najjar on 03/15/2017 09:18:09 [name], [name] ([id number]) -------------------------------------------------------------------------------- Physical Exam Details Patient Name: [name] Date of Service: 03/15/2017 8:45 AM Medical Record Patient Account Number: [id number] Number: Treating RN: Phillis Haggis [birth year] (55 y.o. Other Clinician: Date of Birth/Sex: Female) Treating Oden Lindaman Primary Care Provider: Joen [name] Provider/Extender: G Referring Provider: Tedra Senegal in Treatment: 25 Constitutional Sitting or standing Blood Pressure is within target range for patient.. Pulse regular and within target range for patient.Marland Kitchen Respirations regular, non-labored and within target range.. Temperature is normal and within the target range for the patient.. Patient's appearance is neat and clean. Appears in no acute distress. Well nourished and well developed.. Eyes Conjunctivae clear. No  discharge.Marland Kitchen Respiratory Respiratory effort is easy and symmetric bilaterally. Rate is normal at rest and on room air.. Musculoskeletal Left popliteal fossa looks normal. Integumentary (Hair, Skin) No erythema or soft tissue crepitus. Notes Wound exam; there is no evidence of surrounding erythema and no soft tissue crepitus is noted depth of this is 4.6 cm which is a 0.5 cm improvement since her last visit here 2 weeks ago. On 01/04/2017 this was 6.7 cm, this was not the full depth of this wound initially however. I think we are making slow but steady improvement here. Electronic Signature(s) Signed: 03/15/2017 4:37:19 PM By: Baltazar Najjar MD Entered By: Baltazar Najjar on 03/15/2017 09:19:58 [name], [name] ([id number]) -------------------------------------------------------------------------------- Physician Orders Details Patient Name: Dugar, [name] Date of Service:  03/15/2017 8:45 AM Medical Record Patient Account Number: [id number] Number: Treating RN: Phillis Haggis 21-Sep-1961 (56 y.o. Other Clinician: Date of Birth/Sex: Female) Treating Maritza Hosterman Primary Care Provider: Joen [name] Provider/Extender: G Referring Provider: Tedra Senegal in Treatment: 25 Verbal / Phone Orders: Yes Clinician: Ashok Cordia, Debi Read Back and Verified: Yes Diagnosis Coding Wound Cleansing Wound #3 Right Gluteal fold o Clean wound with Normal Saline. o Cleanse wound with mild soap and water Anesthetic Wound #3 Right Gluteal fold o Topical Lidocaine 4% cream applied to wound bed prior to debridement - clinic use only Skin Barriers/Peri-Wound Care Wound #3 Right Gluteal fold o Skin Prep Dressing Change Frequency Wound #3 Right Gluteal fold o Three times weekly - HHRN to change MONDAY, and FRIDAY and pt to come to wound clinic on [facility], every other week. Follow-up Appointments Wound #3 Right Gluteal fold o Return Appointment in 2  weeks. Off-Loading Wound #3 Right Gluteal fold o Roho cushion for wheelchair - *********[facility] to order*********** Home Health Wound #3 Right Gluteal fold o Continue Home Health Visits - [facility] *****Please order ROHO cushion****** o Home Health Nurse may visit PRN to address patientos wound care needs. o FACE TO FACE ENCOUNTER: MEDICARE and MEDICAID PATIENTS: I certify that this patient is under my care and that I had a face-to-face encounter that meets the physician face-to-face Bayle, TYONNA MINETTI. ([id number]) encounter requirements with this patient on this date. The encounter with the patient was in whole or in part for the following MEDICAL CONDITION: (primary reason for Home Healthcare) MEDICAL NECESSITY: I certify, that based on my findings, NURSING services are a medically necessary home health service. HOME BOUND STATUS: I certify that my clinical findings support that this patient is homebound (i.e., Due to illness or injury, pt requires aid of supportive devices such as crutches, cane, wheelchairs, walkers, the use of special transportation or the assistance of another person to leave their place of residence. There is a normal inability to leave the home and doing so requires considerable and taxing effort. Other absences are for medical reasons / religious services and are infrequent or of short duration when for other reasons). o If current dressing causes regression in wound condition, may D/C ordered dressing product/s and apply Normal Saline Moist Dressing daily until next Wound Healing Center / Other MD appointment. Notify Wound Healing Center of regression in wound condition at [phone]. o Please direct any NON-WOUND related issues/requests for orders to patient's Primary Care Physician Negative Pressure Wound Therapy Wound #3 Right Gluteal fold o Wound VAC settings at 125/130 mmHg continuous pressure. Use BLACK/GREEN foam  to wound cavity. Use WHITE foam to fill any tunnel/s and/or undermining. Change VAC dressing 3 X WEEK. Change canister as indicated when full. Nurse may titrate settings and frequency of dressing changes as clinically indicated. - Place white foam into wound in long strip and place where you can pull the white foam out. (WHITE FOAM ONLY IN THE WOUND) USE BLACK FOAM TO BRIDGE..... Please drape wound as according. Start now please if possible. o Home Health Nurse may d/c VAC for s/s of increased infection, significant wound regression, or uncontrolled drainage. Notify Wound Healing Center at [phone]. o Number of foam/gauze pieces used in the dressing = Medications-please add to medication list. Wound #3 Right Gluteal fold o P.O. Antibiotics - Bactrim and Augmentin o Other: - Vitamin C, Zinc, Multivitamins IV antibiotics Electronic Signature(s) Signed: 03/15/2017 4:37:19 PM By: Baltazar Najjar MD Signed: 03/15/2017  4:46:19 PM By: Alejandro Mulling Entered By: Alejandro Mulling on 03/15/2017 09:11:14 [name], [name] ([id number]) -------------------------------------------------------------------------------- Problem List Details Patient Name: [name] Date of Service: 03/15/2017 8:45 AM Medical Record Patient Account Number: [id number] Number: Treating RN: Phillis Haggis [birth year] (55 y.o. 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  with unspecified 09/21/2016 09/21/2016 severity Resolved Problems ICD-10 Code Description Active Date Resolved Date L89.323 Pressure ulcer of left buttock, stage 3 09/21/2016 09/21/2016 L89.324 Pressure ulcer of left buttock, stage 4 11/16/2016 11/16/2016 Electronic Signature(s) Signed: 03/15/2017 4:37:19 PM By: Baltazar Najjar MD [name], [name] Entered By: Baltazar Najjar on 03/15/2017 09:15:14 [name], [name] ([id number]) -------------------------------------------------------------------------------- Progress Note Details Patient Name: [name] Date of Service: 03/15/2017 8:45 AM Medical Record Patient Account Number: [id number] Number: Treating RN: Phillis Haggis Dec 02, 1961 (55 y.o. Other Clinician: Date of Birth/Sex: Female) Treating Niveah Boerner Primary Care Provider: Joen [name] Provider/Extender: G Referring Provider: Tedra Sen[place] in Treatment: 25 Subjective Chief Complaint Information obtained from Patient Ms. Urieta presents today for routine managament of bilateral ischial pressure ulcers and sinus tract to left popliteal fossa. History of Present Illness (HPI) The following HPI elements were documented for the patient's wound: Location: bilateral gluteal ulcerations and left popliteal ulceration with tunneling Quality: adits to intermittent aching to ischial ulcers, no discomfort to sinus tract Severity: right iscial ulcer with increased depth Duration: chronic ulcers to bilateral ischial ulcers and sinus tract Timing: The pain is intermittent in severity as far as how intense it becomes but is present all the time. Manipulationn makes this worse. Context: The wound occurred when the patient had a fall and was unconscious for about 48 hours laying on the floor and she had pressure injury at that stage. Modifying Factors: Other treatment(s) tried include:as noted below she has been seen by visiting wound care physicians or nurse  practitioners and details have been noted Associated Signs and Symptoms: has yet to receive offloading cushions and mattress overlay 56 year old patient who was seen by visiting [name] wound care specialist for a wound on both her buttock and was found to have an unstageable wound on the right buttock for about 2 months. I understand that she had a fall and was laying on the floor for about 48 hours before she was found and taken to the ICU and had a long injury to her gluteal area from pressure and also had broken her right humerus. She has had a right proximal humerus fracture and has been followed up with orthopedics recently. The patient has a past medical history of type 2 diabetes mellitus, paraparesis, acute pyelonephritis, GERD, hypertension, glaucoma, chronic pain, anxiety neurosis, nicotine dependence, COPD. the patient had some debridement done and was to operative was recommended to use Silvadene dressing and offloading. She is a smoker and occasionally smokes a few cigarettes. the patient requested a second opinion for months and is here to discuss her care. 09/21/16; the patient re-presents from home today for review of 3 different wounds. I note that she was seen in the clinic here in July at which time  she had bilateral buttock wounds. It was apparently suggested at that time that she use a wound VAC bridged to both wounds just near the initial tuberosity's bilaterally which she refused. Bonczek, Shamaine L. ([id number]) The history was a bit difficult to put together. Apparently this patient became ill at the end of April of this year. She was found sitting on the floor she had apparently been for 2 days and subsequently admitted to hospital from 04/23/16 through 05/02/16 and at that point she was critically ill ultimately having sepsis secondary to UTI, nontraumatic rhabdomyolysis and diabetic ketoacidosis. She had acute renal failure and I think required ICU care including intubation.  Patient states her wounds actually started at that point on the bilateral issue tuberosities however in reviewing the discharge summary from 5/8 I see no reference to wounds at that point. It did state that she had left lower extremity cellulitis however. Reviewing Epic I see no relevant x-rays. It would appear that her discharge creatinine was within the normal range and indeed on 9/15 her creatinine has remained normal. [facility] There the wounds on her bilateral Botox were dressed. Only just before her discharge from the nursing facility she developed an "knot" which was interpreted as cellulitis on the posterior aspect of her left knee she was given antibiotics. Apparently sometime late in July a this actually opened and became a wound at home health care was tending to however she is still having purulent drainage coming from this and by my understanding the wound depth is actually become unmeasurable. I am not really clear about what home health has been placing in any of these wound areas. The patient states that is something with silver and it. She is not been systemically unwell no fever or chills her appetite is good. She is a diabetic poorly controlled however she states that her recent blood sugars at home have been in the low to mid 100s. 09/28/16 On evaluation today patient appears to continue to exhibit the 3 areas of ulceration that were noted previous. She did have an x-ray of the right pelvis which showed evidence of potential soft tissue infection but no obvious osteomyelitis. There was a discussion last office visit concerning the possibility of a wound VAC. Witth that being said the x-ray report suggested that an MRI may be more appropriate to further evaluate the extent. Subsequently in regard to the wound over the popliteal portion of the left lower extremity with tunneling at 12:00 the CT scan that was ordered was  denied by insurance as they state the patient has not had x-rays prior to advanced imaging. Patient states that she is frustrated with the situation overall. 10/05/16 in the interval since I last saw this patient last week she has had the x-ray of the knee performed. I did review that x-ray today and fortunately shows no evidence of osteomyelitis or other acute abnormality at this point in time. She continues to have the opening iin the posterior oral popliteal space with tracking proximal up the posterior thigh. Nothing seems to have worsened but it also seems to have not improved. The same is true in regard to the right pressure ulcer over the gluteal region which extends toward the ischium. The left gluteal pressure ulcer actually appears to be doing somewhat better my opinion there is some necrotic slough but overall this appears fairly well. She tells me thatt she has some discomfort especially when home health is helping  her with dressing changes as they do not know her. At worse she rates her pain to be a 5 out of 10 right now it's more like a 1 out of 10. 10/07/16; still the patient has 3 different wound areas. She has a deep stage IV wound over her right ischial tuberosity. She is due to have an MRI next week. The wound over her left ischial tuberosity is more superficial and underwent debridement today. Finally she has a small open area in her left popliteal fossa the probes on measurably forward superiorly. Still a lot of drainage coming out of this. The CT scan that I ordered 3 weeks ago was questioned by her insurance company wanting a plain x-ray first. As I understand things result of this is nothing has been done in 3 weeks in terms of imaging the thigh and she has an MRI booked of this along with her pelvis for next week line 10/12/16; patient has a deep probing wound over the right ischiall tuberosity, stage III wound over the left visual tuberosity and a draining sinus in her  left popliteal fossa. None of this much different from when I saw this 3 weeks ago. We have been using silver rope to the right ischial wound and a draining area in the left popliteal fossa. Plain silver alginate to the area on the left ischial tuberosity 10/19/16; the patient's wounds are essentially unchanged although the area on the left lower gluteal is actually improved. Our intake nurse noted drainage from the right initial tuberosity probing wound as well as [name], [name] ([id number]) the draining area in the left popliteal fossa. Both of these were cultured. She had x-rays I think at the insistence of her insurance company on 09/23/16 x-ray of the pelvis was not particularly helpful she did have soft tissue air over the right lower pelvis although with the depth of this wound this is not surprising. An x-ray of her left knee did not show any specific abnormalities. We are still using silver alginate to these wound areas. Her MRI is booked for 10/27 10/26/16; cultures of the purulent drainage in her right initial tuberosity wound grew moderate Proteus and few staph aureus. The same organisms were cultured out of the left knee sinus tract posteriorly. The staph aureus is MRSA. I had started her on Augmentin last week I added doxycycline. The MRI of the left lower extremity and pelvis was finally done. The MRI of the femur showed subcutaneous soft tissue swelling edema fluid and myositis in the vastus lateralis muscle but no soft tissue abscess septic arthritis or osteomyelitis. MRI of the pelvis showed the left wound to be more expensive extending down to the bone there was osteomyelitis. Left hamstring tendons were also involved. No septic arthritis involving the hip. The decubitus ulcer on the right side showed no definite osteomyelitis or abscess.. The right hip wound is actually the one the probes 6 cm downward. But the MRI showing infection including osteomyelitis on the left explains  the draining sinus in the popliteal fossa on the left. She did have antibiotics in the hospitalization last time and this extended into her nursing home stay but I'm not exactly sure what antibiotics and for what duration. According the patient this did include vancomycin with considerable effort of our staff we are able to get the patient into see Dr. Sampson Goon today. There were transportation difficulties. Her mother had open heart surgery and is in the ICU in [place] therefore her brother was unable  to transport. Dr. Jarrett Ables office graciously arranged time to see her today. From my point of view she is going to require IV vancomycin plus perhaps a third generation cephalosporin. I plan to keep her on doxycycline and Augmentin until the IV antibiotics can be arranged. 11/02/2016 - Emmely presents today for management of ulcers; She saw Dr. 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 She is to have the PICC line placed today and receive the initial dose for both antibiotics today. She has yet to receive the offloading chair cushion and/or mattress overlay from home health, apparently this has been a 3 week process. I encouraged her to speak to home health regarding this matter, along with offering home health to contact the wouns care center with any questions or concerns. The left ischial pressure ulcer continues to imporve, while to right ischial ulcer has increased in depth. The popliteal fossa sinus tract remains unmeasurable due to the limitation of depth measurement (tract extends beyond our measuring devices). 11-16-2016 Ms. Bodie presents today for evaluation and management of bilateral ischial stage IV pressure ulcers and sinus tract to the left popliteal fossa. she is under the care of Dr. Sampson Goon for IV antibiotic therapy; she states that the vancomycin was placed on hold and will be  restarted at a lower dose based on her renal function. She continues taking Zosyn in addition to the vancomycin. She also states that she has yet to receive offloading cushions from home health, according to her she does not qualify for these offloading cushions because "the ulcers are unstageable ". We will contact the home health agency today to lend clarity regarding her pressure ulcers. The left popliteal fossa sinus tract continues to be a measurable as it extends beyond the length of our measuring devices.. 11/30/16; the patient is still on vancomycin and Zosyn. The depth of the draining sinus behind her left popliteal fossa is down to 4 cm although there is still serosanguineous drainage coming out of this. She saw Dr. Sampson Goon of infectious disease yesterday the idea is to weeks more of IV antibiotics and then oral antibiotics although I have not read his note. The area on the left gluteal fold is just about healed. She has a 6 cm draining sinus over the right initial tuberosity although I cannot feel bone at the base of this. As far as the patient is aware she has not had a recheck of her inflammatory markers. 12/07/16; patient is on vancomycin and Zosyn appointment with Dr. Sampson Goon on the 19th at which point the patient expects to have a change in antibiotics. Remarkable improvement over the wound over the left ischial tuberosity which is just about closed. The draining sinus in her popliteal fossa has 0.4 cm in depth. [name], Domanique L. ([id number]) The area on the right ischial tuberosity still probes down 7 cm. This is closed and overall wound dimensions but not depth. 12/14/16; patient is completing her vancomycin and Zosyn and per her she is going to transition to Bactrim and Augmentin for another 3 weeks. The area on her left gluteal fold is closed except for some skin tears. The area behind her left knee is no longer has any depth. The only remaining area that is of clinical  concern is on the right gluteal fold probing towards the right ischial tuberosity. Today this measures 6.9 cm in depth. Very gritty surface 12/21/16; patient is now on Bactrim and Augmentin as  directed by infectious disease. This should be for another 2 weeks. The area in her left gluteal fold and left popliteal are closed over and fully healed. Measurements today at 7 cm in the right buttock wound is unchanged from last week. 12/27/16; patient is on Bactrim and Augmentin for another week as directed by infectious disease. She has completed her IV antibiotics. She is not been systemically unwell no fever no chills. The area on her right buttock measured over 6 cm in depth. There is no palpable bone. No evidence of surrounding soft tissue infection. She is complaining of tongue irritation and has a history of thrush 01/04/17; patient is been back to see Dr. Sampson Goon, her Augmentin was stopped but he continued the Bactrim for another 3 weeks. Depth of the wound is 6.7 cm there is been no major change in either direction. She is not receive the wound VAC from home health I think because of confusion about who is supposed to provided will actually talk to the [facility] agency [facility] The net no major change. 01/18/17; patient obtained her wound VAC about 10 days ago however for some reason it was not actually put on the wound. She is therefore here for [place] to apply this I guess. No other issues are noted. She is not complaining of pain fever drainage 02/01/17; patient is here now having the wound VAC or 3-4 weeks to a deep pressure area over the right initial tuberosity. This measures 6 cm in depth today which is about half a centimeter better than 2 weeks ago. There is no evidence she is systemically unwell no fever no chills no pain around the area. 02/15/17; I follow this patient every 2 weeks for a deep area over the right ischial tuberosity. This measures 5.5 cm today which is a continued  improvement of 1.2 cm from 1/10 and down 0.5 cm from her visit 2 weeks ago 03/01/17; continued difficult area over the right initial tuberosity using the wound VAC. Depth today of 5.1 cm which is improved. Does not appear to be a lot of drainage in the canister. Antibiotics were finally stopped by Dr. Sampson Goon bactrim[]  and inflammatory markers have been repeated 03/15/17; fall this lady every 2 weeks for a difficult area over her lower right gluteal area/ischial tuberosity. Depth today at 4.6 cm. This is a slow but steady improvement in the depth of this wound. Although we have labeled this as a pressure ulcer there may have been an underlying infection here at one point before we saw her. She had osteomyelitis on the left extensively which is since resolved Objective Constitutional Sitting or standing Blood Pressure is within target range for patient.. Pulse regular and within target range for patient.Marland Kitchen Respirations regular, non-labored and within target range.. Temperature is normal and within the target range for the patient.. Patient's appearance is neat and clean. Appears in no acute distress. Well nourished and well developed.. Vitals Time Taken: 8:52 AM, Height: 63 in, Weight: 257 lbs, BMI: 45.5, Temperature: 98.1 F, Pulse: 99 bpm, Respiratory Rate: 18 breaths/min, Blood Pressure: 145/60 mmHg. Moilanen, Sania L. ([id number]) Eyes Conjunctivae clear. No discharge.Marland Kitchen Respiratory Respiratory effort is easy and symmetric bilaterally. Rate is normal at rest and on room air.. Musculoskeletal Left popliteal fossa looks normal. General Notes: Wound exam; there is no evidence of surrounding erythema and no soft tissue crepitus is noted depth of this is 4.6 cm which is a 0.5 cm improvement since her last visit here 2 weeks ago. On  01/04/2017 this was 6.7 cm, this was not the full depth of this wound initially however. I think we are making slow but steady improvement here. Integumentary (Hair,  Skin) No erythema or soft tissue crepitus. Wound #3 status is Open. Original cause of wound was Pressure Injury. The wound is located on the Right Gluteal fold. The wound measures 1cm length x 1.1cm width x 4.6cm depth; 0.864cm^2 area and 3.974cm^3 volume. There is Fat Layer (Subcutaneous Tissue) Exposed exposed. There is a large amount of serous drainage noted. The wound margin is distinct with the outline attached to the wound base. There is medium (34-66%) red granulation within the wound bed. There is a medium (34-66%) amount of necrotic tissue within the wound bed including Adherent Slough. The periwound skin appearance exhibited: Scarring, Maceration. The periwound skin appearance did not exhibit: Callus, Crepitus, Excoriation, Induration, Rash, Dry/Scaly, Atrophie Blanche, Cyanosis, Ecchymosis, Hemosiderin Staining, Mottled, Pallor, Rubor, Erythema. Periwound temperature was noted as No Abnormality. The periwound has tenderness on palpation. 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 ([id number]) Wound #3 Right Gluteal fold: Clean wound with Normal Saline. Cleanse wound with mild soap and water Anesthetic: Wound #3 Right Gluteal fold: Topical Lidocaine 4% cream applied to wound bed prior to debridement - clinic use only Skin Barriers/Peri-Wound Care: Wound #3 Right Gluteal fold: Skin Prep Dressing Change Frequency: Wound #3 Right Gluteal fold: Three times weekly - HHRN to change MONDAY, and FRIDAY and pt to come to wound clinic on [facility], every other week. Follow-up Appointments: Wound #3 Right Gluteal fold: Return Appointment in 2 weeks. Off-Loading: Wound #3 Right Gluteal fold: Roho cushion for wheelchair - *********[facility] to order*********** Home Health: Wound #3 Right Gluteal fold: Continue Home Health Visits -  [facility] *****Please order [facility] cushion****** Home Health Nurse may visit PRN to address patient s wound care needs. FACE TO FACE ENCOUNTER: MEDICARE and MEDICAID PATIENTS: I certify that this patient is under my care and that I had a face-to-face encounter that meets the physician face-to-face encounter requirements with this patient on this date. The encounter with the patient was in whole or in part for the following MEDICAL CONDITION: (primary reason for Home Healthcare) MEDICAL NECESSITY: I certify, that based on my findings, NURSING services are a medically necessary home health service. HOME BOUND STATUS: I certify that my clinical findings support that this patient is homebound (i.e., Due to illness or injury, pt requires aid of supportive devices such as crutches, cane, wheelchairs, walkers, the use of special transportation or the assistance of another person to leave their place of residence. There is a normal inability to leave the home and doing so requires considerable and taxing effort. Other absences are for medical reasons / religious services and are infrequent or of short duration when for other reasons). If current dressing causes regression in wound condition, may D/C ordered dressing product/s and apply Normal Saline Moist Dressing daily until next Wound Healing Center / Other MD appointment. Notify Wound Healing Center of regression in wound condition at [phone]. Please direct any NON-WOUND related issues/requests for orders to patient's Primary Care Physician Negative Pressure Wound Therapy: Wound #3 Right Gluteal fold: Wound VAC settings at 125/130 mmHg continuous pressure. Use BLACK/GREEN foam to wound cavity. Use WHITE foam to fill any tunnel/s and/or undermining. Change VAC dressing 3 X WEEK. Change canister as indicated when  full. Nurse may titrate settings and frequency of dressing changes as clinically indicated. - Place white foam into wound  in long strip and place where you can pull the white foam out. (WHITE FOAM ONLY IN THE WOUND) USE BLACK FOAM TO BRIDGE..... Please drape wound as according. Start now please if possible. Home Health Nurse may d/c VAC for s/s of increased infection, significant wound regression, or uncontrolled drainage. Notify Wound Healing Center at [phone]. Number of foam/gauze pieces used in the dressing = Medications-please add to medication list.: Wound #3 Right Gluteal fold: P.O. Antibiotics - Bactrim and Augmentin Wolak, Whitnie L. ([id number]) Other: - Vitamin C, Zinc, Multivitamins IV antibiotics #1 slow but steady improvement is noted. #2 fortunately no evidence of deeper soft tissue infection that I can tell #3 we continue with a wound VAC. #4 the patient still smokes but she has been compliant with offloading. She has an offloading mattress at home and a Nature conservation officer) Signed: 03/15/2017 4:37:19 PM By: Baltazar Najjar MD Entered By: Baltazar Najjar on 03/15/2017 09:21:09 Ziemba, [name] ([id number]) -------------------------------------------------------------------------------- SuperBill Details Patient Name: [name] Date of Service: 03/15/2017 Medical Record Patient Account Number: [id number] Number: Treating RN: Phillis Haggis 1961-12-02 (55 y.o. Other Clinician: Date of Birth/Sex: Female) Treating Giordana Weinheimer Primary Care Provider: Joen [name] Provider/Extender: G Referring Provider: Joen [name] Service Line: Outpatient Weeks in Treatment: 25 Diagnosis Coding ICD-10 Codes Code Description L89.314 Pressure ulcer of right buttock, stage 4 E11.42 Type 2 diabetes mellitus with diabetic polyneuropathy Facility Procedures CPT4 Code: 62952841 Description: 99213 - WOUND CARE VISIT-LEV 3 EST PT Modifier: Quantity: 1 Physician Procedures CPT4 Code: [id number] Description: 99213 - WC PHYS LEVEL 3 - EST PT ICD-10 Description Diagnosis L89.314  Pressure ulcer of right buttock, stage 4 Modifier: Quantity: 1 Electronic Signature(s) Signed: 03/15/2017 4:37:19 PM By: Baltazar Najjar MD Signed: 03/15/2017 4:46:19 PM By: Alejandro Mulling Entered By: Alejandro Mulling on 03/15/2017 10:26:36

## 2017-03-16 NOTE — Progress Notes (Signed)
[name] ([id number]) Visit Report for 03/15/2017 Arrival Information Details Patient Name: [name] Date of Service: 03/15/2017 8:45 AM Medical Record Patient Account Number: [id number] Number: Treating RN: Ahmed Prima [birth year] (55 y.o. Other Clinician: Date of Birth/Sex: Female) Treating ROBSON, MICHAEL Primary Care Valentine Barney: Lavera Guise Necola Bluestein/Extender: G Referring Corryn Madewell: Sallee Lange in Treatment: 25 Visit Information History Since Last Visit All ordered tests and consults were completed: No Patient Arrived: Wheel Chair Added or deleted any medications: No Arrival Time: 08:49 Any new allergies or adverse reactions: No Accompanied By: self Had a fall or experienced change in No Transfer Assistance: EasyPivot activities of daily living that may affect Patient Lift risk of falls: Patient Identification Verified: Yes Signs or symptoms of abuse/neglect since last No Secondary Verification Process Yes visito Completed: Hospitalized since last visit: No Patient Requires Transmission- No Has Dressing in Place as Prescribed: Yes Based Precautions: Pain Present Now: No Patient Has Alerts: Yes Patient Alerts: DM II Electronic Signature(s) Signed: 03/15/2017 4:46:19 PM By: Alric Quan Entered By: Alric Quan on 03/15/2017 08:52:17 [name], [name] ([id number]) -------------------------------------------------------------------------------- Clinic Level of Care Assessment Details Patient Name: [name] Date of Service: 03/15/2017 8:45 AM Medical Record Patient Account Number: [id number] Number: Treating RN: Ahmed Prima Jul 10, 1961 (56 y.o. Other Clinician: Date of Birth/Sex: Female) Treating ROBSON, Lluveras Primary Care Hodari Chuba: Lavera Guise Orville Mena/Extender: G Referring Keaden Gunnoe: Sallee Lange in Treatment: 25 Clinic Level of Care Assessment Items TOOL 4 Quantity Score X - Use when only an EandM is  performed on FOLLOW-UP visit 1 0 ASSESSMENTS - Nursing Assessment / Reassessment X - Reassessment of Co-morbidities (includes updates in patient status) 1 10 X - Reassessment of Adherence to Treatment Plan 1 5 ASSESSMENTS - Wound and Skin Assessment / Reassessment X - Simple Wound Assessment / Reassessment - one wound 1 5 []  - Complex Wound Assessment / Reassessment - multiple wounds 0 []  - Dermatologic / Skin Assessment (not related to wound area) 0 ASSESSMENTS - Focused Assessment []  - Circumferential Edema Measurements - multi extremities 0 []  - Nutritional Assessment / Counseling / Intervention 0 []  - Lower Extremity Assessment (monofilament, tuning fork, pulses) 0 []  - Peripheral Arterial Disease Assessment (using hand held doppler) 0 ASSESSMENTS - Ostomy and/or Continence Assessment and Care []  - Incontinence Assessment and Management 0 []  - Ostomy Care Assessment and Management (repouching, etc.) 0 PROCESS - Coordination of Care []  - Simple Patient / Family Education for ongoing care 0 X - Complex (extensive) Patient / Family Education for ongoing care 1 20 X - Staff obtains Programmer, systems, Records, Test Results / Process Orders 1 10 X - Staff telephones HHA, Nursing Homes / Clarify orders / etc 1 10 [name], [name] ([id number]) []  - Routine Transfer to another Facility (non-emergent condition) 0 []  - Routine Hospital Admission (non-emergent condition) 0 []  - New Admissions / Biomedical engineer / Ordering NPWT, Apligraf, etc. 0 []  - Emergency Hospital Admission (emergent condition) 0 X - Simple Discharge Coordination 1 10 []  - Complex (extensive) Discharge Coordination 0 PROCESS - Special Needs []  - Pediatric / Minor Patient Management 0 []  - Isolation Patient Management 0 []  - Hearing / Language / Visual special needs 0 []  - Assessment of Community assistance (transportation, D/C planning, etc.) 0 []  - Additional assistance / Altered mentation 0 []  - Support Surface(s)  Assessment (bed, cushion, seat, etc.) 0 INTERVENTIONS - Wound Cleansing / Measurement X - Simple Wound Cleansing - one wound 1 5 []  - Complex  Wound Cleansing - multiple wounds 0 X - Wound Imaging (photographs - any number of wounds) 1 5 []  - Wound Tracing (instead of photographs) 0 X - Simple Wound Measurement - one wound 1 5 []  - Complex Wound Measurement - multiple wounds 0 INTERVENTIONS - Wound Dressings []  - Small Wound Dressing one or multiple wounds 0 X - Medium Wound Dressing one or multiple wounds 1 15 []  - Large Wound Dressing one or multiple wounds 0 X - Application of Medications - topical 1 5 []  - Application of Medications - injection 0 [name], [name] ([id number]) INTERVENTIONS - Miscellaneous []  - External ear exam 0 []  - Specimen Collection (cultures, biopsies, blood, body fluids, etc.) 0 []  - Specimen(s) / Culture(s) sent or taken to Lab for analysis 0 []  - Patient Transfer (multiple staff / [name] / Similar devices) 0 []  - Simple Staple / Suture removal (25 or less) 0 []  - Complex Staple / Suture removal (26 or more) 0 []  - Hypo / Hyperglycemic Management (close monitor of Blood Glucose) 0 []  - Ankle / Brachial Index (ABI) - do not check if billed separately 0 X - Vital Signs 1 5 Has the patient been seen at the hospital within the last three years: Yes Total Score: 110 Level Of Care: New/Established - Level 3 Electronic Signature(s) Signed: 03/15/2017 4:46:19 PM By: Alric Quan Entered By: Alric Quan on 03/15/2017 10:26:29 [name], [name] ([id number]) -------------------------------------------------------------------------------- Encounter Discharge Information Details Patient Name: [name] Date of Service: 03/15/2017 8:45 AM Medical Record Patient Account Number: [id number] Number: Treating RN: Ahmed Prima [birth year] (56 y.o. Other Clinician: Date of Birth/Sex: Female) Treating ROBSON, MICHAEL Primary Care Lounell Schumacher: Lavera Guise Kevontay Burks/Extender: G Referring Paelyn Smick: Sallee Lange in Treatment: 25 Encounter Discharge Information Items Discharge Pain Level: 0 Discharge Condition: Stable Ambulatory Status: Wheelchair Discharge Destination: Home Transportation: Other Accompanied By: self Schedule Follow-up Appointment: Yes Medication Reconciliation completed and No provided to Patient/Care Peyton Spengler: Clinical Summary of Care: Electronic Signature(s) Signed: 03/15/2017 4:46:19 PM By: Alric Quan Entered By: Alric Quan on 03/15/2017 09:08:06 Tyminski, [name] ([id number]) -------------------------------------------------------------------------------- Lower Extremity Assessment Details Patient Name: [name] Date of Service: 03/15/2017 8:45 AM Medical Record Patient Account Number: [id number] Number: Treating RN: Ahmed Prima Mar 13, 1961 (56 y.o. Other Clinician: Date of Birth/Sex: Female) Treating ROBSON, MICHAEL Primary Care Shatavia Santor: Lavera Guise Zebedee Segundo/Extender: G Referring Janifer Gieselman: Sallee Lange in Treatment: 25 Electronic Signature(s) Signed: 03/15/2017 4:46:19 PM By: Alric Quan Entered By: Alric Quan on 03/15/2017 09:00:10 Krasinski, [name] ([id number]) -------------------------------------------------------------------------------- Multi Wound Chart Details Patient Name: [name] Date of Service: 03/15/2017 8:45 AM Medical Record Patient Account Number: [id number] Number: Treating RN: Ahmed Prima 26-Mar-1961 (56 y.o. Other Clinician: Date of Birth/Sex: Female) Treating ROBSON, MICHAEL Primary Care Cerissa Zeiger: Lavera Guise Rayne Loiseau/Extender: G Referring Almetta Liddicoat: Sallee Lange in Treatment: 25 Vital Signs Height(in): 63 Pulse(bpm): 99 Weight(lbs): 257 Blood Pressure 145/60 (mmHg): Body Mass Index(BMI): 46 Temperature(F): 98.1 Respiratory Rate 18 (breaths/min): Photos: [3:No Photos] [N/A:N/A] Wound  Location: [3:Right Gluteal fold] [N/A:N/A] Wounding Event: [3:Pressure Injury] [N/A:N/A] Primary Etiology: [3:Pressure Ulcer] [N/A:N/A] Comorbid History: [3:Asthma, Hypertension, Type II Diabetes, Neuropathy] [N/A:N/A] Date Acquired: [3:04/21/2016] [N/A:N/A] Weeks of Treatment: [3:25] [N/A:N/A] Wound Status: [3:Open] [N/A:N/A] Measurements L x W x D 1x1.1x4.6 [N/A:N/A] (cm) Area (cm) : [3:0.864] [N/A:N/A] Volume (cm) : [3:3.974] [N/A:N/A] % Reduction in Area: [3:71.90%] [N/A:N/A] % Reduction in Volume: 76.90% [N/A:N/A] Classification: [3:Category/Stage IV] [N/A:N/A] Exudate Amount: [3:Large] [N/A:N/A] Exudate Type: [3:Serous] [N/A:N/A] Exudate  Color: [3:amber] [N/A:N/A] Foul Odor After [3:Yes] [N/A:N/A] Cleansing: Odor Anticipated Due to No [N/A:N/A] Product Use: Wound Margin: [3:Distinct, outline attached] [N/A:N/A] Granulation Amount: [3:Medium (34-66%)] [N/A:N/A] Granulation Quality: [3:Red] [N/A:N/A] Necrotic Amount: [3:Medium (34-66%)] [N/A:N/A] Exposed Structures: Fat Layer (Subcutaneous N/A N/A Tissue) Exposed: Yes Epithelialization: None N/A N/A Periwound Skin Texture: Scarring: Yes N/A N/A Excoriation: No Induration: No Callus: No Crepitus: No Rash: No Periwound Skin Maceration: Yes N/A N/A Moisture: Dry/Scaly: No Periwound Skin Color: Atrophie Blanche: No N/A N/A Cyanosis: No Ecchymosis: No Erythema: No Hemosiderin Staining: No Mottled: No Pallor: No Rubor: No Temperature: No Abnormality N/A N/A Tenderness on Yes N/A N/A Palpation: Wound Preparation: Ulcer Cleansing: N/A N/A Rinsed/Irrigated with Saline Topical Anesthetic Applied: Other: lidocaine 4% Treatment Notes Wound #3 (Right Gluteal fold) 1. Cleansed with: Clean wound with Normal Saline 2. Anesthetic Topical Lidocaine 4% cream to wound bed prior to debridement 3. Peri-wound Care: Skin Prep 8. Negative Pressure Wound Therapy Wound Vac to wound continuously at 152m/hg pressure Black  Foam White Foam Number of foam/gauze pieces used in the dressing = Notes 1PIECES WHITE FOAM 1 PIECE BLACK FOAM TO BRIDGE WCHEVETTE, FEE([id number] Electronic Signature(s) Signed: 03/15/2017 4:37:19 PM By: RLinton HamMD Entered By: RLinton Hamon 03/15/2017 09:15:21 Garmany, TGeorgia Dom([id number] -------------------------------------------------------------------------------- MCrockettDetails Patient Name: WBenedetto GoadDate of Service: 03/15/2017 8:45 AM Medical Record Patient Account Number: [id number]: Treating RN: PAhmed Prima3March 24, 1962(56y.o. Other Clinician: Date of Birth/Sex: Female) Treating ROBSON, MICHAEL Primary Care [facility] Violet: BLavera GuiseProvider/Extender: G Referring Ming Kunka: BSallee Langein Treatment: 25 Active Inactive ` Abuse / Safety / Falls / Self Care Management Nursing Diagnoses: Potential for falls Goals: Patient will remain injury free Date Initiated: 09/21/2016 Target Resolution Date: 03/25/2017 Goal Status: Active Interventions: Assess fall risk on admission and as needed Notes: ` Nutrition Nursing Diagnoses: Imbalanced nutrition Potential for alteratiion in Nutrition/Potential for imbalanced nutrition Goals: Patient/caregiver agrees to and verbalizes understanding of need to use nutritional supplements and/or vitamins as prescribed Date Initiated: 09/21/2016 Target Resolution Date: 03/25/2017 Goal Status: Active Patient/caregiver verbalizes understanding of need to maintain therapeutic glucose control per primary care physician Date Initiated: 09/21/2016 Target Resolution Date: 03/25/2017 Goal Status: Active Interventions: Assess patient nutrition upon admission and as needed per policy Brackin, TRiverdale ([id number] Notes: ` Orientation to the Wound Care Program Nursing Diagnoses: Knowledge deficit related to the wound healing center program Goals: Patient/caregiver will verbalize  understanding of the WAmiteDate Initiated: 09/21/2016 Target Resolution Date: 03/25/2017 Goal Status: Active Interventions: Provide education on orientation to the wound center Notes: ` Pain, Acute or Chronic Nursing Diagnoses: Pain, acute or chronic: actual or potential Potential alteration in comfort, pain Goals: Patient will verbalize adequate pain control and receive pain control interventions during procedures as needed Date Initiated: 09/21/2016 Target Resolution Date: 03/25/2017 Goal Status: Active Patient/caregiver will verbalize adequate pain control between visits Date Initiated: 09/21/2016 Target Resolution Date: 03/25/2017 Goal Status: Active Patient/caregiver will verbalize comfort level met Date Initiated: 09/21/2016 Target Resolution Date: 03/25/2017 Goal Status: Active Interventions: Assess comfort goal upon admission Complete pain assessment as per visit requirements Notes: ` Wound/Skin Impairment Nursing Diagnoses: WKETSIA, LINEBAUGH([id number] Impaired tissue integrity Goals: Ulcer/skin breakdown will have a volume reduction of 30% by week 4 Date Initiated: 09/21/2016 Target Resolution Date: 03/25/2017 Goal Status: Active Ulcer/skin breakdown will have a volume reduction of 50% by week 8 Date Initiated: 09/21/2016 Target Resolution Date: 03/25/2017 Goal Status: Active Ulcer/skin  breakdown will have a volume reduction of 80% by week 12 Date Initiated: 09/21/2016 Target Resolution Date: 03/25/2017 Goal Status: Active Interventions: Assess ulceration(s) every visit Notes: Electronic Signature(s) Signed: 03/15/2017 4:46:19 PM By: Alric Quan Entered By: Alric Quan on 03/15/2017 09:00:15 Fecher, [name] ([id number]) -------------------------------------------------------------------------------- Pain Assessment Details Patient Name: [name] Date of Service: 03/15/2017 8:45 AM Medical Record Patient Account Number:  [id number] Number: Treating RN: Ahmed Prima [birth year] (56 y.o. Other Clinician: Date of Birth/Sex: Female) Treating ROBSON, MICHAEL Primary Care Maalle Starrett: Lavera Guise Ark Agrusa/Extender: G Referring Ermie Glendenning: Sallee Lange in Treatment: 25 Active Problems Location of Pain Severity and Description of Pain Patient Has Paino No Site Locations With Dressing Change: No Pain Management and Medication Current Pain Management: Electronic Signature(s) Signed: 03/15/2017 4:46:19 PM By: Alric Quan Entered By: Alric Quan on 03/15/2017 08:52:23 Creighton, [name] ([id number]) -------------------------------------------------------------------------------- Patient/Caregiver Education Details Patient Name: [name] Date of Service: 03/15/2017 8:45 AM Medical Record Patient Account Number: [id number] Number: Treating RN: Ahmed Prima September 17, 1961 (56 y.o. Other Clinician: Date of Birth/Gender: Female) Treating ROBSON, MICHAEL Primary Care Physician: Lavera Guise Physician/Extender: G Referring Physician: Sallee Lange in Treatment: 25 Education Assessment Education Provided To: Patient Education Topics Provided Wound/Skin Impairment: Handouts: Other: change dressing as ordered Methods: Demonstration, Explain/Verbal Responses: State content correctly Electronic Signature(s) Signed: 03/15/2017 4:46:19 PM By: Alric Quan Entered By: Alric Quan on 03/15/2017 09:08:18 Gottlieb, Edna [place]. ([id number]) -------------------------------------------------------------------------------- Wound Assessment Details Patient Name: [name] Date of Service: 03/15/2017 8:45 AM Medical Record Patient Account Number: [id number] Number: Treating RN: Ahmed Prima 07/17/1961 (56 y.o. Other Clinician: Date of Birth/Sex: Female) Treating ROBSON, MICHAEL Primary Care Nevah Dalal: Lavera Guise Kelan Pritt/Extender: G Referring Mikayela Deats:  Sallee Lange in Treatment: 25 Wound Status Wound Number: 3 Primary Pressure Ulcer Etiology: Wound Location: Right Gluteal fold Wound Status: Open Wounding Event: Pressure Injury Comorbid Asthma, Hypertension, Type II Date Acquired: 04/21/2016 History: Diabetes, Neuropathy Weeks Of Treatment: 25 Clustered Wound: No Photos Photo Uploaded By: Alric Quan on 03/15/2017 11:44:48 Wound Measurements Length: (cm) 1 Width: (cm) 1.1 Depth: (cm) 4.6 Area: (cm) 0.864 Volume: (cm) 3.974 % Reduction in Area: 71.9% % Reduction in Volume: 76.9% Epithelialization: None Wound Description Classification: Category/Stage IV Foul Odor Aft Wound Margin: Distinct, outline attached Due to Produc Exudate Amount: Large Slough/Fibrin Exudate Type: Serous Exudate Color: amber er Cleansing: Yes t Use: No o Yes Wound Bed Granulation Amount: Medium (34-66%) Exposed Structure Granulation Quality: Red Fat Layer (Subcutaneous Tissue) Exposed: Yes Necrotic Amount: Medium (34-66%) Justus, Jia L. ([id number]) Necrotic Quality: Adherent Slough Periwound Skin Texture Texture Color No Abnormalities Noted: No No Abnormalities Noted: No Callus: No Atrophie Blanche: No Crepitus: No Cyanosis: No Excoriation: No Ecchymosis: No Induration: No Erythema: No Rash: No Hemosiderin Staining: No Scarring: Yes Mottled: No Pallor: No Moisture Rubor: No No Abnormalities Noted: No Dry / Scaly: No Temperature / Pain Maceration: Yes Temperature: No Abnormality Tenderness on Palpation: Yes Wound Preparation Ulcer Cleansing: Rinsed/Irrigated with Saline Topical Anesthetic Applied: Other: lidocaine 4%, Treatment Notes Wound #3 (Right Gluteal fold) 1. Cleansed with: Clean wound with Normal Saline 2. Anesthetic Topical Lidocaine 4% cream to wound bed prior to debridement 3. Peri-wound Care: Skin Prep 8. Negative Pressure Wound Therapy Wound Vac to wound continuously at 118m/hg  pressure Black Foam White Foam Number of foam/gauze pieces used in the dressing = Notes 1PIECES WHITE FOAM 1 PNellieSignature(s) Signed: 03/15/2017 4:46:19 PM By: PAlric QuanEntered By: PCarolyne Fiscal  Debra on 03/15/2017 08:59:02 Gjerde, BRYCELYN GAMBINO ([id number]) -------------------------------------------------------------------------------- Vitals Details Patient Name: [name] Date of Service: 03/15/2017 8:45 AM Medical Record Patient Account Number: [id number] Number: Treating RN: Ahmed Prima 1961/06/08 (56 y.o. Other Clinician: Date of Birth/Sex: Female) Treating ROBSON, MICHAEL Primary Care Deniro Laymon: Lavera Guise Gwynevere Lizana/Extender: G Referring Rovena Hearld: Sallee Lange in Treatment: 25 Vital Signs Time Taken: 08:52 Temperature (F): 98.1 Height (in): 63 Pulse (bpm): 99 Weight (lbs): 257 Respiratory Rate (breaths/min): 18 Body Mass Index (BMI): 45.5 Blood Pressure (mmHg): 145/60 Reference Range: 80 - 120 mg / dl Electronic Signature(s) Signed: 03/15/2017 4:46:19 PM By: Alric Quan Entered By: Alric Quan on 03/15/2017 08:52:45

## 2017-03-29 ENCOUNTER — Encounter: Payer: Managed Care, Other (non HMO) | Attending: Nurse Practitioner | Admitting: Nurse Practitioner

## 2017-03-29 DIAGNOSIS — E1142 Type 2 diabetes mellitus with diabetic polyneuropathy: Secondary | ICD-10-CM | POA: Diagnosis not present

## 2017-03-29 DIAGNOSIS — K219 Gastro-esophageal reflux disease without esophagitis: Secondary | ICD-10-CM | POA: Diagnosis not present

## 2017-03-29 DIAGNOSIS — I10 Essential (primary) hypertension: Secondary | ICD-10-CM | POA: Insufficient documentation

## 2017-03-29 DIAGNOSIS — L89314 Pressure ulcer of right buttock, stage 4: Secondary | ICD-10-CM | POA: Diagnosis not present

## 2017-03-29 DIAGNOSIS — F17210 Nicotine dependence, cigarettes, uncomplicated: Secondary | ICD-10-CM | POA: Insufficient documentation

## 2017-03-29 DIAGNOSIS — F411 Generalized anxiety disorder: Secondary | ICD-10-CM | POA: Insufficient documentation

## 2017-03-29 DIAGNOSIS — J449 Chronic obstructive pulmonary disease, unspecified: Secondary | ICD-10-CM | POA: Insufficient documentation

## 2017-03-30 NOTE — Progress Notes (Addendum)
YUKTHA, KERCHNER ([id number]) Visit Report for 03/29/2017 Chief Complaint Document Details Patient Name: [name], [name] Date of Service: 03/29/2017 8:00 AM Medical Record Number: [id number] Patient Account Number: [id number] Date of Birth/Sex: [birth year] (56 y.o. Female) Treating RN: Ashok Cordia, Debi Primary Care Provider: Joen [name] Other Clinician: Referring Provider: Joen [name] Treating Provider/Extender: Kathreen Cosier in Treatment: 27 Information Obtained from: Patient Chief Complaint Patient is here for follow-up of a relation of her right ischial pressure ulcer Electronic Signature(s) Signed: 03/29/2017 8:42:43 AM By: Bonnell Public Entered By: Bonnell Public on 03/29/2017 08:42:43 [name], [name] ([id number]) -------------------------------------------------------------------------------- Debridement Details Patient Name: [name] Date of Service: 03/29/2017 8:00 AM Medical Record Number: [id number] Patient Account Number: [id number] Date of Birth/Sex: 1961-12-04 (56 y.o. Female) Treating RN: Ashok Cordia, Debi Primary Care Provider: Joen [name] Other Clinician: Referring Provider: Joen [name] Treating Provider/Extender: Kathreen Cosier in Treatment: 27 Debridement Performed for Wound #3 Right Gluteal fold Assessment: Performed By: Physician Bonnell Public, NP Debridement: Debridement Pre-procedure Yes - 08:31 Verification/Time Out Taken: Start Time: 08:32 Pain Control: Lidocaine 4% Topical Solution Level: Skin/Subcutaneous Tissue Total Area Debrided (L x 1.4 (cm) x 1.4 (cm) = 1.96 (cm) W): Tissue and other Viable, Non-Viable, Exudate, Fibrin/Slough, Subcutaneous material debrided: Instrument: Curette Bleeding: Minimum Hemostasis Achieved: Pressure End Time: 08:35 Procedural Pain: 0 Post Procedural Pain: 0 Response to Treatment: Procedure was tolerated well Post Debridement Measurements of Total Wound Length: (cm) 1.4 Stage: Category/Stage IV Width: (cm)  1.4 Depth: (cm) 4.6 Volume: (cm) 7.081 Character of Wound/Ulcer Post Requires Further Debridement: Debridement Severity of Tissue Post Fat layer exposed Debridement: Post Procedure Diagnosis Same as Pre-procedure Electronic Signature(s) Signed: 03/29/2017 8:42:29 AM By: Bonnell Public Signed: 03/29/2017 2:02:02 PM By: Alejandro Mulling Entered By: Bonnell Public on 03/29/2017 08:42:28 [name], [name] [name], [name] ([id number]) -------------------------------------------------------------------------------- HPI Details Patient Name: [name] Date of Service: 03/29/2017 8:00 AM Medical Record Number: [id number] Patient Account Number: [id number] Date of Birth/Sex: 06/21/1961 (56 y.o. Female) Treating RN: Ashok Cordia, Debi Primary Care Provider: Joen [name] Other Clinician: Referring Provider: Joen [name] Treating Provider/Extender: Kathreen Cosier in Treatment: 27 History of Present Illness Location: bilateral gluteal ulcerations and left popliteal ulceration with tunneling Quality: adits to intermittent aching to ischial ulcers, no discomfort to sinus tract Severity: right iscial ulcer with increased depth Duration: chronic ulcers to bilateral ischial ulcers and sinus tract Timing: The pain is intermittent in severity as far as how intense it becomes but is present all the time. Manipulationn makes this worse. Context: The wound occurred when the patient had a fall and was unconscious for about 48 hours laying on the floor and she had pressure injury at that stage. Modifying Factors: Other treatment(s) tried include:as noted below she has been seen by visiting wound care physicians or nurse practitioners and details have been noted Associated Signs and Symptoms: has yet to receive offloading cushions and mattress overlay HPI Description: 56 year old patient who was seen by visiting [name] wound care specialist for a wound on both her buttock and was found to have an  unstageable wound on the right buttock for about 2 months. I understand that she had a fall and was laying on the floor for about 48 hours before she was found and taken to the ICU and had a long injury to her gluteal area from pressure and also had broken her right humerus. She has had a right proximal humerus fracture and has been followed up with orthopedics recently. The patient has a  past medical history of type 2 diabetes mellitus, paraparesis, acute pyelonephritis, GERD, hypertension, glaucoma, chronic pain, anxiety neurosis, nicotine dependence, COPD. the patient had some debridement done and was to operative was recommended to use Silvadene dressing and offloading. She is a smoker and occasionally smokes a few cigarettes. the patient requested a second opinion for months and is here to discuss her care. 09/21/16; the patient re-presents from home today for review of 3 different wounds. I note that she was seen in the clinic here in July at which time she had bilateral buttock wounds. It was apparently suggested at that time that she use a wound VAC bridged to both wounds just near the initial tuberosity's bilaterally which she refused. The history was a bit difficult to put together. Apparently this patient became ill at the end of April of this year. She was found sitting on the floor she had apparently been for 2 days and subsequently admitted to hospital from 04/23/16 through 05/02/16 and at that point she was critically ill ultimately having sepsis secondary to UTI, nontraumatic rhabdomyolysis and diabetic ketoacidosis. She had acute renal failure and I think required ICU care including intubation. Patient states her wounds actually started at that point on the bilateral issue tuberosities however in reviewing the discharge summary from 5/8 I see no reference to wounds at that point. It did state that she had left lower extremity cellulitis however. Reviewing Epic I see no relevant  x-rays. It would appear that her discharge creatinine was within the normal range and indeed on 9/15 her creatinine has remained normal. [facility] There the wounds on her bilateral Botox were dressed. Only just before her discharge from the nursing facility she developed an "knot" which was interpreted as cellulitis on the posterior aspect of her Chapdelaine, Anayeli L. ([id number]) left knee she was given antibiotics. Apparently sometime late in July a this actually opened and became a wound at home health care was tending to however she is still having purulent drainage coming from this and by my understanding the wound depth is actually become unmeasurable. I am not really clear about what home health has been placing in any of these wound areas. The patient states that is something with silver and it. She is not been systemically unwell no fever or chills her appetite is good. She is a diabetic poorly controlled however she states that her recent blood sugars at home have been in the low to mid 100s. 09/28/16 On evaluation today patient appears to continue to exhibit the 3 areas of ulceration that were noted previous. She did have an x-ray of the right pelvis which showed evidence of potential soft tissue infection but no obvious osteomyelitis. There was a discussion last office visit concerning the possibility of a wound VAC. Witth that being said the x-ray report suggested that an MRI may be more appropriate to further evaluate the extent. Subsequently in regard to the wound over the popliteal portion of the left lower extremity with tunneling at 12:00 the CT scan that was ordered was denied by insurance as they state the patient has not had x-rays prior to advanced imaging. Patient states that she is frustrated with the situation overall. 10/05/16 in the interval since I last saw this patient last week she has had the x-ray of the knee  performed. I did review that x-ray today and fortunately shows no evidence of osteomyelitis or other acute abnormality at this point in  time. She continues to have the opening iin the posterior oral popliteal space with tracking proximal up the posterior thigh. Nothing seems to have worsened but it also seems to have not improved. The same is true in regard to the right pressure ulcer over the gluteal region which extends toward the ischium. The left gluteal pressure ulcer actually appears to be doing somewhat better my opinion there is some necrotic slough but overall this appears fairly well. She tells me thatt she has some discomfort especially when home health is helping her with dressing changes as they do not know her. At worse she rates her pain to be a 5 out of 10 right now it's more like a 1 out of 10. 10/07/16; still the patient has 3 different wound areas. She has a deep stage IV wound over her right ischial tuberosity. She is due to have an MRI next week. The wound over her left ischial tuberosity is more superficial and underwent debridement today. Finally she has a small open area in her left popliteal fossa the probes on measurably forward superiorly. Still a lot of drainage coming out of this. The CT scan that I ordered 3 weeks ago was questioned by her insurance company wanting a plain x-ray first. As I understand things result of this is nothing has been done in 3 weeks in terms of imaging the thigh and she has an MRI booked of this along with her pelvis for next week line 10/12/16; patient has a deep probing wound over the right ischiall tuberosity, stage III wound over the left visual tuberosity and a draining sinus in her left popliteal fossa. None of this much different from when I saw this 3 weeks ago. We have been using silver rope to the right ischial wound and a draining area in the left popliteal fossa. Plain silver alginate to the area on the left ischial  tuberosity 10/19/16; the patient's wounds are essentially unchanged although the area on the left lower gluteal is actually improved. Our intake nurse noted drainage from the right initial tuberosity probing wound as well as the draining area in the left popliteal fossa. Both of these were cultured. She had x-rays I think at the insistence of her insurance company on 09/23/16 x-ray of the pelvis was not particularly helpful she did have soft tissue air over the right lower pelvis although with the depth of this wound this is not surprising. An x-ray of her left knee did not show any specific abnormalities. We are still using silver alginate to these wound areas. Her MRI is booked for 10/27 10/26/16; cultures of the purulent drainage in her right initial tuberosity wound grew moderate Proteus and few staph aureus. The same organisms were cultured out of the left knee sinus tract posteriorly. The staph aureus is MRSA. I had started her on Augmentin last week I added doxycycline. The MRI of the left lower extremity and pelvis was finally done. The MRI of the femur showed subcutaneous soft tissue swelling edema fluid and myositis in the vastus lateralis muscle but no soft tissue abscess septic arthritis or osteomyelitis. MRI of the pelvis showed the left wound to be more expensive extending down to the bone Pardy, [name] ([id number]) there was osteomyelitis. Left hamstring tendons were also involved. No septic arthritis involving the hip. The decubitus ulcer on the right side showed no definite osteomyelitis or abscess.. The right hip wound is actually the one the probes 6 cm downward. But the MRI showing infection  including osteomyelitis on the left explains the draining sinus in the popliteal fossa on the left. She did have antibiotics in the hospitalization last time and this extended into her nursing home stay but I'm not exactly sure what antibiotics and for what duration. According the patient  this did include vancomycin with considerable effort of our staff we are able to get the patient into see Dr. Sampson Goon today. There were transportation difficulties. Her mother had open heart surgery and is in the ICU in [place] therefore her brother was unable to transport. Dr. Jarrett Ables office graciously arranged time to see her today. From my point of view she is going to require IV vancomycin plus perhaps a third generation cephalosporin. I plan to keep her on doxycycline and Augmentin until the IV antibiotics can be arranged. 11/02/2016 - Brailey presents today for management of ulcers; She saw Dr. 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 She is to have the PICC line placed today and receive the initial dose for both antibiotics today. She has yet to receive the offloading chair cushion and/or mattress overlay from home health, apparently this has been a 3 week process. I encouraged her to speak to home health regarding this matter, along with offering home health to contact the wouns care center with any questions or concerns. The left ischial pressure ulcer continues to imporve, while to right ischial ulcer has increased in depth. The popliteal fossa sinus tract remains unmeasurable due to the limitation of depth measurement (tract extends beyond our measuring devices). 11-16-2016 Ms. Burgen presents today for evaluation and management of bilateral ischial stage IV pressure ulcers and sinus tract to the left popliteal fossa. she is under the care of Dr. Sampson Goon for IV antibiotic therapy; she states that the vancomycin was placed on hold and will be restarted at a lower dose based on her renal function. She continues taking Zosyn in addition to the vancomycin. She also states that she has yet to receive offloading cushions from home health, according to her she does not qualify for  these offloading cushions because "the ulcers are unstageable ". We will contact the home health agency today to lend clarity regarding her pressure ulcers. The left popliteal fossa sinus tract continues to be a measurable as it extends beyond the length of our measuring devices.. 11/30/16; the patient is still on vancomycin and Zosyn. The depth of the draining sinus behind her left popliteal fossa is down to 4 cm although there is still serosanguineous drainage coming out of this. She saw Dr. Sampson Goon of infectious disease yesterday the idea is to weeks more of IV antibiotics and then oral antibiotics although I have not read his note. The area on the left gluteal fold is just about healed. She has a 6 cm draining sinus over the right initial tuberosity although I cannot feel bone at the base of this. As far as the patient is aware she has not had a recheck of her inflammatory markers. 12/07/16; patient is on vancomycin and Zosyn appointment with Dr. Sampson Goon on the 19th at which point the patient expects to have a change in antibiotics. Remarkable improvement over the wound over the left ischial tuberosity which is just about closed. The draining sinus in her popliteal fossa has 0.4 cm in depth. The area on the right ischial tuberosity still probes down 7 cm. This is closed and overall wound dimensions but not depth. 12/14/16; patient is  completing her vancomycin and Zosyn and per her she is going to transition to Bactrim and Augmentin for another 3 weeks. The area on her left gluteal fold is closed except for some skin tears. The area behind her left knee is no longer has any depth. The only remaining area that is of clinical concern is on the right gluteal fold probing towards the right ischial tuberosity. Today this measures 6.9 cm in depth. Very gritty surface 12/21/16; patient is now on Bactrim and Augmentin as directed by infectious disease. This should be for another 2 weeks. The  area in her left gluteal fold and left popliteal are closed over and fully healed. Measurements today at 7 cm in the right buttock wound is unchanged from last week. 12/27/16; patient is on Bactrim and Augmentin for another week as directed by infectious disease. She has [name], [name] ([id number]) completed her IV antibiotics. She is not been systemically unwell no fever no chills. The area on her right buttock measured over 6 cm in depth. There is no palpable bone. No evidence of surrounding soft tissue infection. She is complaining of tongue irritation and has a history of thrush 01/04/17; patient is been back to see Dr. Sampson Goon, her Augmentin was stopped but he continued the Bactrim for another 3 weeks. Depth of the wound is 6.7 cm there is been no major change in either direction. She is not receive the wound VAC from home health I think because of confusion about who is supposed to provided will actually talk to the home health agency today [facility] The net no major change. 01/18/17; patient obtained her wound VAC about 10 days ago however for some reason it was not actually put on the wound. She is therefore here for [place] to apply this I guess. No other issues are noted. She is not complaining of pain fever drainage 02/01/17; patient is here now having the wound VAC or 3-4 weeks to a deep pressure area over the right initial tuberosity. This measures 6 cm in depth today which is about half a centimeter better than 2 weeks ago. There is no evidence she is systemically unwell no fever no chills no pain around the area. 02/15/17; I follow this patient every 2 weeks for a deep area over the right ischial tuberosity. This measures 5.5 cm today which is a continued improvement of 1.2 cm from 1/10 and down 0.5 cm from her visit 2 weeks ago 03/01/17; continued difficult area over the right initial tuberosity using the wound VAC. Depth today of 5.1 cm which is improved. Does not appear to be a lot of  drainage in the canister. Antibiotics were finally stopped by Dr. Sampson Goon bactrim[]  and inflammatory markers have been repeated 03/15/17; fall this lady every 2 weeks for a difficult area over her lower right gluteal area/ischial tuberosity. Depth today at 4.6 cm. This is a slow but steady improvement in the depth of this wound. Although we have labeled this as a pressure ulcer there may have been an underlying infection here at one point before we saw her. She had osteomyelitis on the left extensively which is since resolved 03/29/17- patient is here for follow-up evaluation of her right ischial pressure ulcer. She continues with the wound VAC and home health. According to the nurse home health has been using less foams and appropriate and we will instruct accordingly. The patient continues to smoke, approximately 10 cigarettes a day. She has been advised to decrease that in half by her  next appointment, with a goal of complete cessation. She states her blood sugars have been consistently less than 150. She states that she spends most of her day position left lateral or prone. She does have an air mattress on her bed, she does not have an mattress for her chair, she states she cannot afford this. Electronic Signature(s) Signed: 03/29/2017 8:44:57 AM By: Bonnell Public Entered By: Bonnell Public on 03/29/2017 08:44:56 [name], [name] ([id number]) -------------------------------------------------------------------------------- Physical Exam Details Patient Name: [name] Date of Service: 03/29/2017 8:00 AM Medical Record Number: [id number] Patient Account Number: [id number] Date of Birth/Sex: February 03, 1961 (56 y.o. Female) Treating RN: Ashok Cordia, Debi Primary Care Provider: Joen [name] Other Clinician: Referring Provider: Joen [name] Treating Provider/Extender: Kathreen Cosier in Treatment: 6 Constitutional well nourished; well developed; appears stated age;Marland Kitchen Respiratory non-labored  respiratory effort. Integumentary (Hair, Skin) right ischial ulcer- it is difficult to truly visualize the base of this wound with a depth of approximately 4.5/5 cm, there is macerated tissue both to the periwound and at the most superficial aspect that is visualized, no visualized granular tissue, no periwound erythema, no fluctuance, no induration. Psychiatric oriented to time, place, person and situation. calm, pleasant, conversive. Electronic Signature(s) Signed: 03/29/2017 8:46:46 AM By: Bonnell Public Entered By: Bonnell Public on 03/29/2017 08:46:45 [name], [name] ([id number]) -------------------------------------------------------------------------------- Physician Orders Details Patient Name: [name] Date of Service: 03/29/2017 8:00 AM Medical Record Number: [id number] Patient Account Number: [id number] Date of Birth/Sex: [birth year] (56 y.o. Female) Treating RN: Ashok Cordia, Debi Primary Care Provider: Joen [name] Other Clinician: Referring Provider: Joen [name] Treating Provider/Extender: Kathreen Cosier in Treatment: 8 Verbal / Phone Orders: Yes Clinician: Ashok Cordia, Debi Read Back and Verified: Yes Diagnosis Coding Wound Cleansing Wound #3 Right Gluteal fold o Clean wound with Normal Saline. o Cleanse wound with mild soap and water Anesthetic Wound #3 Right Gluteal fold o Topical Lidocaine 4% cream applied to wound bed prior to debridement - clinic use only Skin Barriers/Peri-Wound Care Wound #3 Right Gluteal fold o Skin Prep Dressing Change Frequency Wound #3 Right Gluteal fold o Three times weekly - HHRN to change MONDAY, and FRIDAY and pt to come to wound clinic on [facility], every other week. Follow-up Appointments Wound #3 Right Gluteal fold o Return Appointment in 2 weeks. Off-Loading Wound #3 Right Gluteal fold o Roho cushion for wheelchair - *********[facility] to order*********** Home Health Wound #3 Right Gluteal fold o  Continue Home Health Visits - [facility] *****Please order ROHO cushion****** o Home Health Nurse may visit PRN to address patientos wound care needs. o FACE TO FACE ENCOUNTER: MEDICARE and MEDICAID PATIENTS: I certify that this patient is under my care and that I had a face-to-face encounter that meets the physician face-to-face encounter requirements with this patient on this date. The encounter with the patient was in whole or in part for the following MEDICAL CONDITION: (primary reason for Home Healthcare) Riederer, SAYDE LISH ([id number]) MEDICAL NECESSITY: I certify, that based on my findings, NURSING services are a medically necessary home health service. HOME BOUND STATUS: I certify that my clinical findings support that this patient is homebound (i.e., Due to illness or injury, pt requires aid of supportive devices such as crutches, cane, wheelchairs, walkers, the use of special transportation or the assistance of another person to leave their place of residence. There is a normal inability to leave the home and doing so requires considerable and taxing effort. Other absences are for medical reasons / religious  services and are infrequent or of short duration when for other reasons). o If current dressing causes regression in wound condition, may D/C ordered dressing product/s and apply Normal Saline Moist Dressing daily until next Wound Healing Center / Other MD appointment. Notify Wound Healing Center of regression in wound condition at [phone]. o Please direct any NON-WOUND related issues/requests for orders to patient's Primary Care Physician Negative Pressure Wound Therapy Wound #3 Right Gluteal fold o Wound VAC settings at 125/130 mmHg continuous pressure. Use BLACK/GREEN foam to wound cavity. Use WHITE foam to fill any tunnel/s and/or undermining. Change VAC dressing 3 X WEEK. Change canister as indicated when full. Nurse may titrate settings and  frequency of dressing changes as clinically indicated. - Place white foam into wound in long strip and place where you can pull the white foam out. You want the white foam 2cm from the depth and don't cut the white foam too thick we just want to wick it. (WHITE FOAM ONLY IN THE WOUND) USE BLACK FOAM TO BRIDGE..... Please drape wound as according. Start now please if possible. o Home Health Nurse may d/c VAC for s/s of increased infection, significant wound regression, or uncontrolled drainage. Notify Wound Healing Center at [phone]. o Number of foam/gauze pieces used in the dressing = Medications-please add to medication list. Wound #3 Right Gluteal fold o Other: - Vitamin C, Zinc, Multivitamins Electronic Signature(s) Signed: 03/29/2017 2:02:02 PM By: Alejandro Mulling Signed: 03/29/2017 4:22:06 PM By: Bonnell Public Entered By: Alejandro Mulling on 03/29/2017 09:25:02 Jowett, [name] ([id number]) -------------------------------------------------------------------------------- Problem List Details Patient Name: [name] Date of Service: 03/29/2017 8:00 AM Medical Record Number: [id number] Patient Account Number: [id number] Date of Birth/Sex: [birth year] (56 y.o. 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 ([id number]) [name], [name] ([id number]) -------------------------------------------------------------------------------- Progress Note Details Patient Name: [name] Date of Service: 03/29/2017 8:00 AM Medical Record Number: [id number] Patient Account Number: [id number] Date of Birth/Sex: 1961-04-15 (56 y.o. Female) Treating RN: Ashok Cordia, Debi Primary Care Provider: Joen [name] Other Clinician: Referring Provider: Joen [name] Treating Provider/Extender: Kathreen Cosier in Treatment: 27 Subjective Chief Complaint Information obtained from Patient Patient is here for follow-up of a relation of her right ischial pressure ulcer History of Present Illness (HPI) The following HPI elements were documented for the patient's wound: Location: bilateral gluteal ulcerations and left popliteal ulceration with tunneling Quality: adits to intermittent aching to ischial ulcers, no discomfort to sinus tract Severity: right iscial ulcer with increased depth Duration: chronic ulcers to bilateral ischial ulcers and sinus tract Timing: The pain is intermittent in severity as far as how intense it becomes but is present all the time. Manipulationn makes this worse. Context: The wound occurred when the patient had a fall and was unconscious for about 48  hours laying on the floor and she had pressure injury at that stage. Modifying Factors: Other treatment(s) tried include:as noted below she has been seen by visiting wound care physicians or nurse practitioners and details have been noted Associated Signs and Symptoms: has yet to receive offloading cushions and mattress overlay 56 year old patient who was seen by visiting [name] wound care specialist for a  wound on both her buttock and was found to have an unstageable wound on the right buttock for about 2 months. I understand that she had a fall and was laying on the floor for about 48 hours before she was found and taken to the ICU and had a long injury to her gluteal area from pressure and also had broken her right humerus. She has had a right proximal humerus fracture and has been followed up with orthopedics recently. The patient has a past medical history of type 2 diabetes mellitus, paraparesis, acute pyelonephritis, GERD, hypertension, glaucoma, chronic pain, anxiety neurosis, nicotine dependence, COPD. the patient had some debridement done and was to operative was recommended to use Silvadene dressing and offloading. She is a smoker and occasionally smokes a few cigarettes. the patient requested a second opinion for months and is here to discuss her care. 09/21/16; the patient re-presents from home today for review of 3 different wounds. I note that she was seen in the clinic here in July at which time she had bilateral buttock wounds. It was apparently suggested at that time that she use a wound VAC bridged to both wounds just near the initial tuberosity's bilaterally which she refused. The history was a bit difficult to put together. Apparently this patient became ill at the end of April of this year. She was found sitting on the floor she had apparently been for 2 days and subsequently admitted to hospital from 04/23/16 through 05/02/16 and at that point she was critically ill ultimately having sepsis Dugue, [name] ([id number]) secondary to UTI, nontraumatic rhabdomyolysis and diabetic ketoacidosis. She had acute renal failure and I think required ICU care including intubation. Patient states her wounds actually started at that point on the bilateral issue tuberosities however in reviewing the discharge summary from 5/8 I see no reference to wounds at that point. It did state that she had  left lower extremity cellulitis however. Reviewing Epic I see no relevant x-rays. It would appear that her discharge creatinine was within the normal range and indeed on 9/15 her creatinine has remained normal. [facility] There the wounds on her bilateral Botox were dressed. Only just before her discharge from the nursing facility she developed an "knot" which was interpreted as cellulitis on the posterior aspect of her left knee she was given antibiotics. Apparently sometime late in July a this actually opened and became a wound at home health care was tending to however she is still having purulent drainage coming from this and by my understanding the wound depth is actually become unmeasurable. I am not really clear about what home health has been placing in any of these wound areas. The patient states that is something with silver and it. She is not been systemically unwell no fever or chills her appetite is good. She is a diabetic poorly controlled however she states that her recent blood sugars at home have been in the low to mid 100s. 09/28/16 On evaluation today patient appears to continue to exhibit the 3 areas of ulceration that were noted previous.  She did have an x-ray of the right pelvis which showed evidence of potential soft tissue infection but no obvious osteomyelitis. There was a discussion last office visit concerning the possibility of a wound VAC. Witth that being said the x-ray report suggested that an MRI may be more appropriate to further evaluate the extent. Subsequently in regard to the wound over the popliteal portion of the left lower extremity with tunneling at 12:00 the CT scan that was ordered was denied by insurance as they state the patient has not had x-rays prior to advanced imaging. Patient states that she is frustrated with the situation overall. 10/05/16 in the interval since I last saw this patient  last week she has had the x-ray of the knee performed. I did review that x-ray today and fortunately shows no evidence of osteomyelitis or other acute abnormality at this point in time. She continues to have the opening iin the posterior oral popliteal space with tracking proximal up the posterior thigh. Nothing seems to have worsened but it also seems to have not improved. The same is true in regard to the right pressure ulcer over the gluteal region which extends toward the ischium. The left gluteal pressure ulcer actually appears to be doing somewhat better my opinion there is some necrotic slough but overall this appears fairly well. She tells me thatt she has some discomfort especially when home health is helping her with dressing changes as they do not know her. At worse she rates her pain to be a 5 out of 10 right now it's more like a 1 out of 10. 10/07/16; still the patient has 3 different wound areas. She has a deep stage IV wound over her right ischial tuberosity. She is due to have an MRI next week. The wound over her left ischial tuberosity is more superficial and underwent debridement today. Finally she has a small open area in her left popliteal fossa the probes on measurably forward superiorly. Still a lot of drainage coming out of this. The CT scan that I ordered 3 weeks ago was questioned by her insurance company wanting a plain x-ray first. As I understand things result of this is nothing has been done in 3 weeks in terms of imaging the thigh and she has an MRI booked of this along with her pelvis for next week line 10/12/16; patient has a deep probing wound over the right ischiall tuberosity, stage III wound over the left visual tuberosity and a draining sinus in her left popliteal fossa. None of this much different from when I saw this 3 weeks ago. We have been using silver rope to the right ischial wound and a draining area in the left popliteal fossa. Plain silver alginate to  the area on the left ischial tuberosity 10/19/16; the patient's wounds are essentially unchanged although the area on the left lower gluteal is actually improved. Our intake nurse noted drainage from the right initial tuberosity probing wound as well as the draining area in the left popliteal fossa. Both of these were cultured. She had x-rays I think at the insistence of her insurance company on 09/23/16 x-ray of the pelvis was not particularly helpful she did have soft tissue air over the right lower pelvis although with the depth of this wound this is not surprising. An x-ray Buechele, Latifah L. ([id number]) of her left knee did not show any specific abnormalities. We are still using silver alginate to these wound areas. Her MRI is booked for 10/27 10/26/16;  cultures of the purulent drainage in her right initial tuberosity wound grew moderate Proteus and few staph aureus. The same organisms were cultured out of the left knee sinus tract posteriorly. The staph aureus is MRSA. I had started her on Augmentin last week I added doxycycline. The MRI of the left lower extremity and pelvis was finally done. The MRI of the femur showed subcutaneous soft tissue swelling edema fluid and myositis in the vastus lateralis muscle but no soft tissue abscess septic arthritis or osteomyelitis. MRI of the pelvis showed the left wound to be more expensive extending down to the bone there was osteomyelitis. Left hamstring tendons were also involved. No septic arthritis involving the hip. The decubitus ulcer on the right side showed no definite osteomyelitis or abscess.. The right hip wound is actually the one the probes 6 cm downward. But the MRI showing infection including osteomyelitis on the left explains the draining sinus in the popliteal fossa on the left. She did have antibiotics in the hospitalization last time and this extended into her nursing home stay but I'm not exactly sure what antibiotics and for what  duration. According the patient this did include vancomycin with considerable effort of our staff we are able to get the patient into see Dr. Sampson Goon today. There were transportation difficulties. Her mother had open heart surgery and is in the ICU in [place] therefore her brother was unable to transport. Dr. Jarrett Ables office graciously arranged time to see her today. From my point of view she is going to require IV vancomycin plus perhaps a third generation cephalosporin. I plan to keep her on doxycycline and Augmentin until the IV antibiotics can be arranged. 11/02/2016 - Jahmiya presents today for management of ulcers; She saw Dr. 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 She is to have the PICC line placed today and receive the initial dose for both antibiotics today. She has yet to receive the offloading chair cushion and/or mattress overlay from home health, apparently this has been a 3 week process. I encouraged her to speak to home health regarding this matter, along with offering home health to contact the wouns care center with any questions or concerns. The left ischial pressure ulcer continues to imporve, while to right ischial ulcer has increased in depth. The popliteal fossa sinus tract remains unmeasurable due to the limitation of depth measurement (tract extends beyond our measuring devices). 11-16-2016 Ms. Malerba presents today for evaluation and management of bilateral ischial stage IV pressure ulcers and sinus tract to the left popliteal fossa. she is under the care of Dr. Sampson Goon for IV antibiotic therapy; she states that the vancomycin was placed on hold and will be restarted at a lower dose based on her renal function. She continues taking Zosyn in addition to the vancomycin. She also states that she has yet to receive offloading cushions from home health, according to her  she does not qualify for these offloading cushions because "the ulcers are unstageable ". We will contact the home health agency today to lend clarity regarding her pressure ulcers. The left popliteal fossa sinus tract continues to be a measurable as it extends beyond the length of our measuring devices.. 11/30/16; the patient is still on vancomycin and Zosyn. The depth of the draining sinus behind her left popliteal fossa is down to 4 cm although there is still serosanguineous drainage coming out of this. She saw Dr. Sampson Goon of infectious  disease yesterday the idea is to weeks more of IV antibiotics and then oral antibiotics although I have not read his note. The area on the left gluteal fold is just about healed. She has a 6 cm draining sinus over the right initial tuberosity although I cannot feel bone at the base of this. As far as the patient is aware she has not had a recheck of her inflammatory markers. 12/07/16; patient is on vancomycin and Zosyn appointment with Dr. Sampson Goon on the 19th at which point the patient expects to have a change in antibiotics. Remarkable improvement over the wound over the left ischial tuberosity which is just about closed. The draining sinus in her popliteal fossa has 0.4 cm in depth. The area on the right ischial tuberosity still probes down 7 cm. This is closed and overall wound dimensions but not depth. 12/14/16; patient is completing her vancomycin and Zosyn and per her she is going to transition to Bactrim Edge, Zarria L. ([id number]) and Augmentin for another 3 weeks. The area on her left gluteal fold is closed except for some skin tears. The area behind her left knee is no longer has any depth. The only remaining area that is of clinical concern is on the right gluteal fold probing towards the right ischial tuberosity. Today this measures 6.9 cm in depth. Very gritty surface 12/21/16; patient is now on Bactrim and Augmentin as directed by infectious  disease. This should be for another 2 weeks. The area in her left gluteal fold and left popliteal are closed over and fully healed. Measurements today at 7 cm in the right buttock wound is unchanged from last week. 12/27/16; patient is on Bactrim and Augmentin for another week as directed by infectious disease. She has completed her IV antibiotics. She is not been systemically unwell no fever no chills. The area on her right buttock measured over 6 cm in depth. There is no palpable bone. No evidence of surrounding soft tissue infection. She is complaining of tongue irritation and has a history of thrush 01/04/17; patient is been back to see Dr. Sampson Goon, her Augmentin was stopped but he continued the Bactrim for another 3 weeks. Depth of the wound is 6.7 cm there is been no major change in either direction. She is not receive the wound VAC from home health I think because of confusion about who is supposed to provided will actually talk to the [facility] health agency [facility] The net no major change. 01/18/17; patient obtained her wound VAC about 10 days ago however for some reason it was not actually put on the wound. She is therefore here for [place] to apply this I guess. No other issues are noted. She is not complaining of pain fever drainage 02/01/17; patient is here now having the wound VAC or 3-4 weeks to a deep pressure area over the right initial tuberosity. This measures 6 cm in depth today which is about half a centimeter better than 2 weeks ago. There is no evidence she is systemically unwell no fever no chills no pain around the area. 02/15/17; I follow this patient every 2 weeks for a deep area over the right ischial tuberosity. This measures 5.5 cm today which is a continued improvement of 1.2 cm from 1/10 and down 0.5 cm from her visit 2 weeks ago 03/01/17; continued difficult area over the right initial tuberosity using the wound VAC. Depth today of 5.1 cm which is improved. Does not  appear to be a lot of  drainage in the canister. Antibiotics were finally stopped by Dr. Sampson Goon bactrim[]  and inflammatory markers have been repeated 03/15/17; fall this lady every 2 weeks for a difficult area over her lower right gluteal area/ischial tuberosity. Depth today at 4.6 cm. This is a slow but steady improvement in the depth of this wound. Although we have labeled this as a pressure ulcer there may have been an underlying infection here at one point before we saw her. She had osteomyelitis on the left extensively which is since resolved 03/29/17- patient is here for follow-up evaluation of her right ischial pressure ulcer. She continues with the wound VAC and home health. According to the nurse home health has been using less foams and appropriate and we will instruct accordingly. The patient continues to smoke, approximately 10 cigarettes a day. She has been advised to decrease that in half by her next appointment, with a goal of complete cessation. She states her blood sugars have been consistently less than 150. She states that she spends most of her day position left lateral or prone. She does have an air mattress on her bed, she does not have an mattress for her chair, she states she cannot afford this. Objective Constitutional well nourished; well developed; appears stated age;. Neukam, [name] ([id number]) Vitals Time Taken: 8:20 AM, Height: 63 in, Weight: 257 lbs, BMI: 45.5, Temperature: 98.1 F, Pulse: 92 bpm, Respiratory Rate: 18 breaths/min, Blood Pressure: 127/58 mmHg. Respiratory non-labored respiratory effort. Psychiatric oriented to time, place, person and situation. calm, pleasant, conversive. Integumentary (Hair, Skin) right ischial ulcer- it is difficult to truly visualize the base of this wound with a depth of approximately 4.5/5 cm, there is macerated tissue both to the periwound and at the most superficial aspect that is visualized, no visualized granular  tissue, no periwound erythema, no fluctuance, no induration. Wound #3 status is Open. Original cause of wound was Pressure Injury. The wound is located on the Right Gluteal fold. The wound measures 1.4cm length x 1.4cm width x 4.6cm depth; 1.539cm^2 area and 7.081cm^3 volume. There is Fat Layer (Subcutaneous Tissue) Exposed exposed. There is no tunneling or undermining noted. There is a large amount of serous drainage noted. The wound margin is distinct with the outline attached to the wound base. There is medium (34-66%) red granulation within the wound bed. There is a medium (34-66%) amount of necrotic tissue within the wound bed including Adherent Slough. The periwound skin appearance exhibited: Scarring, Maceration. The periwound skin appearance did not exhibit: Callus, Crepitus, Excoriation, Induration, Rash, Dry/Scaly, Atrophie Blanche, Cyanosis, Ecchymosis, Hemosiderin Staining, Mottled, Pallor, Rubor, Erythema. Periwound temperature was noted as No Abnormality. The periwound has tenderness on palpation. Assessment Active Problems ICD-10 L89.314 - Pressure ulcer of right buttock, stage 4 E11.42 - Type 2 diabetes mellitus with diabetic polyneuropathy - smoking and recurrent pressure continue to be an ongoing obstacle to healing - We discussed the need for smoking cessation with the goal of cutting her daily intake by half before her next visit Dolley, Jeana L. ([id number]) - She states her glucose levels are consistently less than 157 this does not seem to be an exacerbating factor - nursing staff is advised to give specific instructions to home health regarding the length of white foam to be placed in the wound, we'll see if this makes any advancements in wound healing the next appointment Procedures Wound #3 Wound #3 is a Pressure Ulcer located on the Right Gluteal fold . There was a Skin/Subcutaneous Tissue Debridement ([id number]) debridement  with total area of 1.96 sq cm performed  by Bonnell Public, NP. with the following instrument(s): Curette to remove Viable and Non-Viable tissue/material including Exudate, Fibrin/Slough, and Subcutaneous after achieving pain control using Lidocaine 4% Topical Solution. A time out was conducted at 08:31, prior to the start of the procedure. A Minimum amount of bleeding was controlled with Pressure. The procedure was tolerated well with a pain level of 0 throughout and a pain level of 0 following the procedure. Post Debridement Measurements: 1.4cm length x 1.4cm width x 4.6cm depth; 7.081cm^3 volume. Post debridement Stage noted as Category/Stage IV. Character of Wound/Ulcer Post Debridement requires further debridement. Severity of Tissue Post Debridement is: Fat layer exposed. Post procedure Diagnosis Wound #3: Same as Pre-Procedure Plan Wound Cleansing: Wound #3 Right Gluteal fold: Clean wound with Normal Saline. Cleanse wound with mild soap and water Anesthetic: Wound #3 Right Gluteal fold: Topical Lidocaine 4% cream applied to wound bed prior to debridement - clinic use only Skin Barriers/Peri-Wound Care: Wound #3 Right Gluteal fold: Skin Prep Dressing Change Frequency: Wound #3 Right Gluteal fold: Three times weekly - HHRN to change MONDAY, and FRIDAY and pt to come to wound clinic on [facility], every other week. Follow-up Appointments: Wound #3 Right Gluteal fold: Return Appointment in 2 weeks. Off-Loading: Wound #3 Right Gluteal fold: Koos, Nasteho L. ([id number]) Roho cushion for wheelchair - *********[facility] to order*********** Home Health: Wound #3 Right Gluteal fold: Continue Home Health Visits - [facility] *****Please order [facility] cushion****** Home Health Nurse may visit PRN to address patient s wound care needs. FACE TO FACE ENCOUNTER: MEDICARE and MEDICAID PATIENTS: I certify that this patient is under my care and that I had a face-to-face encounter that meets the physician face-to-face  encounter requirements with this patient on this date. The encounter with the patient was in whole or in part for the following MEDICAL CONDITION: (primary reason for Home Healthcare) MEDICAL NECESSITY: I certify, that based on my findings, NURSING services are a medically necessary home health service. HOME BOUND STATUS: I certify that my clinical findings support that this patient is homebound (i.e., Due to illness or injury, pt requires aid of supportive devices such as crutches, cane, wheelchairs, walkers, the use of special transportation or the assistance of another person to leave their place of residence. There is a normal inability to leave the home and doing so requires considerable and taxing effort. Other absences are for medical reasons / religious services and are infrequent or of short duration when for other reasons). If current dressing causes regression in wound condition, may D/C ordered dressing product/s and apply Normal Saline Moist Dressing daily until next Wound Healing Center / Other MD appointment. Notify Wound Healing Center of regression in wound condition at [phone]. Please direct any NON-WOUND related issues/requests for orders to patient's Primary Care Physician Negative Pressure Wound Therapy: Wound #3 Right Gluteal fold: Wound VAC settings at 125/130 mmHg continuous pressure. Use BLACK/GREEN foam to wound cavity. Use WHITE foam to fill any tunnel/s and/or undermining. Change VAC dressing 3 X WEEK. Change canister as indicated when full. Nurse may titrate settings and frequency of dressing changes as clinically indicated. - Place white foam into wound in long strip and place where you can pull the white foam out. You want the white foam 2cm from the depth and don't cut the white foam too thick we just want to wick it. (WHITE FOAM ONLY IN THE WOUND) USE BLACK FOAM TO BRIDGE..... Please  drape wound as according. Start now please if possible. Home Health Nurse  may d/c VAC for s/s of increased infection, significant wound regression, or uncontrolled drainage. Notify [facility] at [phone]. Number of foam/gauze pieces used in the dressing = Medications-please add to medication list.: Wound #3 Right Gluteal fold: Other: - Vitamin C, Zinc, Multivitamins 1. Continue with white foam and black foam, changed 3 times weekly per home health 2. Decrease smoking 3. Continue with tight glycemic control 4. Continue with offloading 5. Follow-up in 2 weeks Clavin, IMYA MANCE ([id number]) Electronic Signature(s) Signed: 03/29/2017 4:29:37 PM By: Bonnell Public Previous Signature: 03/29/2017 8:48:43 AM Version By: Bonnell Public Entered By: Bonnell Public on 03/29/2017 16:29:37 Moseley, [name] ([id number]) -------------------------------------------------------------------------------- SuperBill Details Patient Name: [name] Date of Service: 03/29/2017 Medical Record Number: [id number] Patient Account Number: [id number] Date of Birth/Sex: Aug 24, 1961 (56 y.o. 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

## 2017-03-30 NOTE — Progress Notes (Signed)
[name], [name] ([id number]) Visit Report for 03/29/2017 Arrival Information Details Patient Name: [name], [name] Date of Service: 03/29/2017 8:00 AM Medical Record Number: [id number] Patient Account Number: [id number] Date of Birth/Sex: 1961/07/10 (56 y.o. Female) Treating RN: Carolyne Fiscal, Debi Primary Care Khandi Kernes: Lavera Guise Other Clinician: Referring Vernadine Coombs: Lavera Guise Treating Tydarius Yawn/Extender: Cathie Olden in Treatment: 27 Visit Information History Since Last Visit All ordered tests and consults were completed: No Patient Arrived: Wheel Chair Added or deleted any medications: No Arrival Time: 08:18 Any new allergies or adverse reactions: No Accompanied By: self Had a fall or experienced change in No Transfer Assistance: [employer] activities of daily living that may affect Patient Lift [name] of falls: Patient Identification Verified: Yes Signs or symptoms of abuse/neglect since last No Secondary Verification Process Yes visito Completed: Hospitalized since last visit: No Patient Requires Transmission- No Has Dressing in Place as Prescribed: Yes Based Precautions: Pain Present Now: No Patient Has Alerts: Yes Patient Alerts: DM II Electronic Signature(s) Signed: 03/29/2017 2:02:02 PM By: Alric Quan Entered By: Alric Quan on 03/29/2017 08:24:05 Wirtz, Rosan Carlean [name] ([id number]) -------------------------------------------------------------------------------- Encounter Discharge Information Details Patient Name: [name] Date of Service: 03/29/2017 8:00 AM Medical Record Number: [id number] Patient Account Number: [id number] Date of Birth/Sex: 09/18/1961 (56 y.o. Female) Treating RN: Carolyne Fiscal, Debi Primary Care Rafan Sanders: Lavera Guise Other Clinician: Referring Randeep Biondolillo: Lavera Guise Treating Orie Cuttino/Extender: Cathie Olden in Treatment: 27 Encounter Discharge Information Items Discharge Pain Level: 0 Discharge Condition: Stable Ambulatory  Status: Wheelchair Discharge Destination: Home Transportation: Other Accompanied By: self Schedule Follow-up Appointment: Yes Medication Reconciliation completed No and provided to Patient/Care Kariah Loredo: Patient Clinical Summary of Care: Declined Electronic Signature(s) Signed: 03/29/2017 9:08:07 AM By: Ruthine Dose Entered By: Ruthine Dose on 03/29/2017 09:08:06 Leyh, [name] ([id number]) -------------------------------------------------------------------------------- Lower Extremity Assessment Details Patient Name: [name] Date of Service: 03/29/2017 8:00 AM Medical Record Number: [id number] Patient Account Number: [id number] Date of Birth/Sex: [birth year] (56 y.o. Female) Treating RN: Carolyne Fiscal, Debi Primary Care Rhea Thrun: Lavera Guise Other Clinician: Referring Aries Townley: Lavera Guise Treating Mckinley Adelstein/Extender: Cathie Olden in Treatment: 27 Electronic Signature(s) Signed: 03/29/2017 2:02:02 PM By: Alric Quan Entered By: Alric Quan on 03/29/2017 08:28:23 [name], [name] ([id number]) -------------------------------------------------------------------------------- Multi Wound Chart Details Patient Name: [name] Date of Service: 03/29/2017 8:00 AM Medical Record Number: [id number] Patient Account Number: [id number] Date of Birth/Sex: [birth year] (56 y.o. Female) Treating RN: Carolyne Fiscal, Debi Primary Care Uchechukwu Dhawan: Lavera Guise Other Clinician: Referring Salia Cangemi: Lavera Guise Treating Epimenio Schetter/Extender: Cathie Olden in Treatment: 65 Photos: [3:No Photos] [N/A:N/A] Wound Location: [3:Right Gluteal fold] [N/A:N/A] Wounding Event: [3:Pressure Injury] [N/A:N/A] Primary Etiology: [3:Pressure Ulcer] [N/A:N/A] Comorbid History: [3:Asthma, Hypertension, Type II Diabetes, Neuropathy] [N/A:N/A] Date Acquired: [3:04/21/2016] [N/A:N/A] Weeks of Treatment: [3:27] [N/A:N/A] Wound Status: [3:Open] [N/A:N/A] Measurements L x W x D 1.4x1.4x4.6  [N/A:N/A] (cm) Area (cm) : [3:1.539] [N/A:N/A] Volume (cm) : [3:7.081] [N/A:N/A] % Reduction in Area: [3:49.90%] [N/A:N/A] % Reduction in Volume: 58.80% [N/A:N/A] Classification: [3:Category/Stage IV] [N/A:N/A] Exudate Amount: [3:Large] [N/A:N/A] Exudate Type: [3:Serous] [N/A:N/A] Exudate Color: [3:amber] [N/A:N/A] Foul Odor After [3:Yes] [N/A:N/A] Cleansing: Odor Anticipated Due to No [N/A:N/A] Product Use: Wound Margin: [3:Distinct, outline attached] [N/A:N/A] Granulation Amount: [3:Medium (34-66%)] [N/A:N/A] Granulation Quality: [3:Red] [N/A:N/A] Necrotic Amount: [3:Medium (34-66%)] [N/A:N/A] Exposed Structures: [3:Fat Layer (Subcutaneous Tissue) Exposed: Yes] [N/A:N/A] Epithelialization: [3:None] [N/A:N/A] Debridement: [3:Debridement ([id number])] [N/A:N/A] Pre-procedure [3:08:31] [N/A:N/A] Verification/Time Out Taken: Pain Control: [3:Lidocaine 4% Topical Solution] [N/A:N/A] Tissue Debrided: Fibrin/Slough, Exudates, N/A N/A Subcutaneous Level: Skin/Subcutaneous N/A N/A Tissue Debridement Area (  sq 1.96 N/A N/A cm): Instrument: Curette N/A N/A Bleeding: Minimum N/A N/A Hemostasis Achieved: Pressure N/A N/A Procedural Pain: 0 N/A N/A Post Procedural Pain: 0 N/A N/A Debridement Treatment Procedure was tolerated N/A N/A Response: well Post Debridement 1.4x1.4x4.6 N/A N/A Measurements L x W x D (cm) Post Debridement 7.081 N/A N/A Volume: (cm) Post Debridement Category/Stage IV N/A N/A Stage: Periwound Skin Texture: Scarring: Yes N/A N/A Excoriation: No Induration: No Callus: No Crepitus: No Rash: No Periwound Skin Maceration: Yes N/A N/A Moisture: Dry/Scaly: No Periwound Skin Color: Atrophie Blanche: No N/A N/A Cyanosis: No Ecchymosis: No Erythema: No Hemosiderin Staining: No Mottled: No Pallor: No Rubor: No Temperature: No Abnormality N/A N/A Tenderness on Yes N/A N/A Palpation: Wound Preparation: Ulcer Cleansing: N/A N/A Rinsed/Irrigated  with Saline Topical Anesthetic Applied: Other: lidocaine 4% Procedures Performed: Debridement N/A N/A Treatment Notes [name], [name] ([id number]) Wound #3 (Right Gluteal fold) 1. Cleansed with: Clean wound with Normal Saline 2. Anesthetic Topical Lidocaine 4% cream to wound bed prior to debridement 3. Peri-wound Care: Skin Prep 8. Negative Pressure Wound Therapy Wound Vac to wound continuously at 126m/hg pressure Black Foam White Foam Number of foam/gauze pieces used in the dressing = Notes 1PIECES WHITE FOAM 1[place]burgSignature(s) Signed: 03/29/2017 8:42:15 AM By: CLawanda CousinsEntered By: CLawanda Cousinson 03/29/2017 08:42:15 Rex, TGeorgia [name]([id number] -------------------------------------------------------------------------------- MLoviliaDetails Patient Name: WBenedetto GoadDate of Service: 03/29/2017 8:00 AM Medical Record Number: [id number] Account Number: [id number] of Birth/Sex: 303/04/1961(56 y.o. Female) Treating RN: PCarolyne Fiscal Debi Primary Care Alyssa Rotondo: BLavera GuiseOther Clinician: Referring Detra Bores: BLavera GuiseTreating Carrin Vannostrand/Extender: CCathie Oldenin Treatment: 27 Active Inactive ` Abuse / Safety / Falls / Self Care Management Nursing Diagnoses: Potential for falls Goals: Patient will remain injury free Date Initiated: 09/21/2016 Target Resolution Date: 03/25/2017 Goal Status: Active Interventions: Assess fall [name] on admission and as needed Notes: ` Nutrition Nursing Diagnoses: Imbalanced nutrition Potential for alteratiion in Nutrition/Potential for imbalanced nutrition Goals: Patient/caregiver agrees to and verbalizes understanding of need to use nutritional supplements and/or vitamins as prescribed Date Initiated: 09/21/2016 Target Resolution Date: 03/25/2017 Goal Status: Active Patient/caregiver verbalizes understanding of need to maintain therapeutic glucose control per  primary care physician Date Initiated: 09/21/2016 Target Resolution Date: 03/25/2017 Goal Status: Active Interventions: Assess patient nutrition upon admission and as needed per policy Notes: `[name], [name]([id number] Orientation to the Wound Care Program Nursing Diagnoses: Knowledge deficit related to the wound healing center program Goals: Patient/caregiver will verbalize understanding of the [place]enton [place]Date Initiated: 09/21/2016 Target Resolution Date: 03/25/2017 Goal Status: Active Interventions: Provide education on orientation to the wound center Notes: ` Pain, Acute or Chronic Nursing Diagnoses: Pain, acute or chronic: actual or potential Potential alteration in comfort, pain Goals: Patient will verbalize adequate pain control and receive pain control interventions during procedures as needed Date Initiated: 09/21/2016 Target Resolution Date: 03/25/2017 Goal Status: Active Patient/caregiver will verbalize adequate pain control between visits Date Initiated: 09/21/2016 Target Resolution Date: 03/25/2017 Goal Status: Active Patient/caregiver will verbalize comfort level met Date Initiated: 09/21/2016 Target Resolution Date: 03/25/2017 Goal Status: Active Interventions: Assess comfort goal upon admission Complete pain assessment as per visit requirements Notes: ` Wound/Skin Impairment Nursing Diagnoses: Impaired tissue integrity Goals: [name], [name] ([id number] Ulcer/skin breakdown will have a volume reduction of 30% by week 4 Date Initiated: 09/21/2016 Target Resolution Date: 03/25/2017 Goal Status: Active Ulcer/skin breakdown will have a volume reduction  of 50% by week 8 Date Initiated: 09/21/2016 Target Resolution Date: 03/25/2017 Goal Status: Active Ulcer/skin breakdown will have a volume reduction of 80% by week 12 Date Initiated: 09/21/2016 Target Resolution Date: 03/25/2017 Goal Status: Active Interventions: Assess ulceration(s) every  visit Notes: Electronic Signature(s) Signed: 03/29/2017 2:02:02 PM By: Alric Quan Entered By: Alric Quan on 03/29/2017 08:30:47 Breidenbach, [name] ([id number]) -------------------------------------------------------------------------------- Pain Assessment Details Patient Name: [name] Date of Service: 03/29/2017 8:00 AM Medical Record Number: [id number] Patient Account Number: [id number] Date of Birth/Sex: 1961-03-24 (56 y.o. Female) Treating RN: Carolyne Fiscal, Debi Primary Care Nakeyia Menden: Lavera Guise Other Clinician: Referring Aiyla Baucom: Lavera Guise Treating Kennita Pavlovich/Extender: Cathie Olden in Treatment: 27 Active Problems Location of Pain Severity and Description of Pain Patient Has Paino No Site Locations With Dressing Change: No Pain Management and Medication Current Pain Management: Electronic Signature(s) Signed: 03/29/2017 2:02:02 PM By: Alric Quan Entered By: Alric Quan on 03/29/2017 08:24:13 [name], [name] ([id number]) -------------------------------------------------------------------------------- Patient/Caregiver Education Details Patient Name: [name] Date of Service: 03/29/2017 8:00 AM Medical Record Number: [id number] Patient Account Number: [id number] Date of Birth/Gender: [birth year] (56 y.o. Female) Treating RN: Carolyne Fiscal, Debi Primary Care Physician: Lavera Guise Other Clinician: Referring Physician: Lavera Guise Treating Physician/Extender: Cathie Olden in Treatment: 13 Education Assessment Education Provided To: Patient Education Topics Provided Wound/Skin Impairment: Handouts: Other: change dressing as ordered Methods: Demonstration, Explain/Verbal Responses: State content correctly Electronic Signature(s) Signed: 03/29/2017 2:02:02 PM By: Alric Quan Entered By: Alric Quan on 03/29/2017 08:37:22 [name], [name]  ([id number]) -------------------------------------------------------------------------------- Wound Assessment Details Patient Name: [name] Date of Service: 03/29/2017 8:00 AM Medical Record Number: [id number] Patient Account Number: [id number] Date of Birth/Sex: 26-Aug-1961 (56 y.o. Female) Treating RN: Carolyne Fiscal, Debi Primary Care Charmaine Placido: Lavera Guise Other Clinician: Referring Estefania Kamiya: Lavera Guise Treating Walter Min/Extender: Cathie Olden in Treatment: 27 Wound Status Wound Number: 3 Primary Pressure Ulcer Etiology: Wound Location: Right Gluteal fold Wound Status: Open Wounding Event: Pressure Injury Comorbid Asthma, Hypertension, Type II Date Acquired: 04/21/2016 History: Diabetes, Neuropathy Weeks Of Treatment: 27 Clustered Wound: No Photos Photo Uploaded By: Alric Quan on 03/29/2017 12:57:15 Wound Measurements Length: (cm) 1.4 Width: (cm) 1.4 Depth: (cm) 4.6 Area: (cm) 1.539 Volume: (cm) 7.081 % Reduction in Area: 49.9% % Reduction in Volume: 58.8% Epithelialization: None Tunneling: No Undermining: No Wound Description Classification: Category/Stage IV Foul Odor Aft Wound Margin: Distinct, outline attached Due to Produc Exudate Amount: Large Slough/Fibrin Exudate Type: Serous Exudate Color: amber er Cleansing: Yes t Use: No o Yes Wound Bed Granulation Amount: Medium (34-66%) Exposed Structure Granulation Quality: Red Fat Layer (Subcutaneous Tissue) Exposed: Yes Necrotic Amount: Medium (34-66%) Necrotic Quality: Adherent Slough [name], [name] ([id number]) Periwound Skin Texture Texture Color No Abnormalities Noted: No No Abnormalities Noted: No Callus: No Atrophie Blanche: No Crepitus: No Cyanosis: No Excoriation: No Ecchymosis: No Induration: No Erythema: No Rash: No Hemosiderin Staining: No Scarring: Yes Mottled: No Pallor: No Moisture Rubor: No No Abnormalities Noted: No Dry / Scaly: No Temperature /  Pain Maceration: Yes Temperature: No Abnormality Tenderness on Palpation: Yes Wound Preparation Ulcer Cleansing: Rinsed/Irrigated with Saline Topical Anesthetic Applied: Other: lidocaine 4%, Treatment Notes Wound #3 (Right Gluteal fold) 1. Cleansed with: Clean wound with Normal Saline 2. Anesthetic Topical Lidocaine 4% cream to wound bed prior to debridement 3. Peri-wound Care: Skin Prep 8. Negative Pressure Wound Therapy Wound Vac to wound continuously at 175m/hg pressure Black Foam White Foam Number of foam/gauze pieces used in the dressing = Notes 1PIECES WHITE FOAM 1  PIECE BLACK FOAM TO BRIDGE Electronic Signature(s) Signed: 03/29/2017 2:02:02 PM By: Alric Quan Entered By: Alric Quan on 03/29/2017 08:28:05 [name], [name] ([id number]) -------------------------------------------------------------------------------- Zeigler Details Patient Name: [name] Date of Service: 03/29/2017 8:00 AM Medical Record Number: [id number] Patient Account Number: [id number] Date of Birth/Sex: [birth year] (56 y.o. Female) Treating RN: Carolyne Fiscal, Debi Primary Care Zafira Munos: Lavera Guise Other Clinician: Referring Signa Cheek: Lavera Guise Treating Jaree Dwight/Extender: Cathie Olden in Treatment: 27 Vital Signs Time Taken: 08:20 Temperature (F): 98.1 Height (in): 63 Pulse (bpm): 92 Weight (lbs): 257 Respiratory Rate (breaths/min): 18 Body Mass Index (BMI): 45.5 Blood Pressure (mmHg): 127/58 Reference Range: 80 - 120 mg / dl Electronic Signature(s) Signed: 03/29/2017 2:02:02 PM By: Alric Quan Entered By: Alric Quan on 03/29/2017 09:23:32

## 2017-04-12 ENCOUNTER — Encounter: Payer: Managed Care, Other (non HMO) | Admitting: Internal Medicine

## 2017-04-12 DIAGNOSIS — L89314 Pressure ulcer of right buttock, stage 4: Secondary | ICD-10-CM | POA: Diagnosis not present

## 2017-04-13 NOTE — Progress Notes (Addendum)
[name] ([id number]) Visit Report for 04/12/2017 Chief Complaint Document Details Patient Name: [name], [name] Date of Service: 04/12/2017 8:00 AM Medical Record Patient Account Number: [id number] Number: Treating RN: Phillis Haggis Jan 30, 1961 (56 y.o. Other Clinician: Date of Birth/Sex: Female) Treating Alannis Hsia Primary Care Provider: Joen [name] Provider/Extender: G Referring Provider: Tedra Senegal in Treatment: 29 Information Obtained from: Patient Chief Complaint Patient is here for follow-up of a relation of her right ischial pressure ulcer Electronic Signature(s) Signed: 04/12/2017 5:12:22 PM By: Baltazar Najjar MD Entered By: Baltazar Najjar on 04/12/2017 08:29:14 [name], [name] ([id number]) -------------------------------------------------------------------------------- Debridement Details Patient Name: [name] Date of Service: 04/12/2017 8:00 AM Medical Record Patient Account Number: [id number] Number: Treating RN: Phillis Haggis [birth year] (56 y.o. Other Clinician: Date of Birth/Sex: Female) Treating Leonel Mccollum Primary Care Provider: Joen [name] Provider/Extender: G Referring Provider: Tedra Senegal in Treatment: 29 Debridement Performed for Wound #3 Right Gluteal fold Assessment: Performed By: Physician Maxwell Caul, MD Debridement: Debridement Pre-procedure Yes - 08:21 Verification/Time Out Taken: Start Time: 08:22 Pain Control: Lidocaine 4% Topical Solution Level: Skin/Subcutaneous Tissue Total Area Debrided ([name] x 1 (cm) x 2 (cm) = 2 (cm) W): Tissue and other Viable, Non-Viable, Exudate, Fibrin/Slough, Subcutaneous material debrided: Instrument: Other : scoop Bleeding: Minimum Hemostasis Achieved: Pressure End Time: 08:25 Procedural Pain: 0 Post Procedural Pain: 0 Response to Treatment: Procedure was tolerated well Post Debridement Measurements of Total Wound Length: (cm) 1 Stage:  Category/Stage IV Width: (cm) 2 Depth: (cm) 4.4 Volume: (cm) 6.912 Character of Wound/Ulcer Post Requires Further Debridement: Debridement Severity of Tissue Post Fat layer exposed Debridement: Post Procedure Diagnosis Same as Pre-procedure Electronic Signature(s) Signed: 04/12/2017 4:28:23 PM By: Norris Cross, [name] ([id number]) Signed: 04/12/2017 5:12:22 PM By: Baltazar Najjar MD Entered By: Baltazar Najjar on 04/12/2017 08:29:07 [name], [name] ([id number]) -------------------------------------------------------------------------------- HPI Details Patient Name: [name] Date of Service: 04/12/2017 8:00 AM Medical Record Patient Account Number: [id number] Number: Treating RN: Phillis Haggis Mar 15, 1961 (56 y.o. Other Clinician: Date of Birth/Sex: Female) Treating Ermie Glendenning Primary Care Provider: Joen [name] Provider/Extender: G Referring Provider: Tedra Senegal in Treatment: 29 History of Present Illness Location: bilateral gluteal ulcerations and left popliteal ulceration with tunneling Quality: adits to intermittent aching to ischial ulcers, no discomfort to sinus tract Severity: right iscial ulcer with increased depth Duration: chronic ulcers to bilateral ischial ulcers and sinus tract Timing: The pain is intermittent in severity as far as how intense it becomes but is present all the time. Manipulationn makes this worse. Context: The wound occurred when the patient had a fall and was unconscious for about 48 hours laying on the floor and she had pressure injury at that stage. Modifying Factors: Other treatment(s) tried include:as noted below she has been seen by visiting wound care physicians or nurse practitioners and details have been noted Associated Signs and Symptoms: has yet to receive offloading cushions and mattress overlay HPI Description: 56 year old patient who was seen by visiting [name] wound care specialist for a wound  on both her buttock and was found to have an unstageable wound on the right buttock for about 2 months. I understand that she had a fall and was laying on the floor for about 48 hours before she was found and taken to the ICU and had a long injury to her gluteal area from pressure and also had broken her right humerus. She has had a right proximal humerus fracture and has been followed  up with orthopedics recently. The patient has a past medical history of type 2 diabetes mellitus, paraparesis, acute pyelonephritis, GERD, hypertension, glaucoma, chronic pain, anxiety neurosis, nicotine dependence, COPD. the patient had some debridement done and was to operative was recommended to use Silvadene dressing and offloading. She is a smoker and occasionally smokes a few cigarettes. the patient requested a second opinion for months and is here to discuss her care. 09/21/16; the patient re-presents from home today for review of 3 different wounds. I note that she was seen in the clinic here in July at which time she had bilateral buttock wounds. It was apparently suggested at that time that she use a wound VAC bridged to both wounds just near the initial tuberosity's bilaterally which she refused. The history was a bit difficult to put together. Apparently this patient became ill at the end of April of this year. She was found sitting on the floor she had apparently been for 2 days and subsequently admitted to hospital from 04/23/16 through 05/02/16 and at that point she was critically ill ultimately having sepsis secondary to UTI, nontraumatic rhabdomyolysis and diabetic ketoacidosis. She had acute renal failure and I think required ICU care including intubation. Patient states her wounds actually started at that point on the bilateral issue tuberosities however in reviewing the discharge summary from 5/8 I see no reference to wounds at that point. It did state that she had left lower extremity cellulitis  however. Reviewing Epic I see no relevant x-rays. It would appear that her discharge creatinine was within the normal range and indeed on 9/15 her creatinine has remained normal. [facility]A, SCHWENKE. ([id number]) rehabilitation. There the wounds on her bilateral Buttocks were dressed. Only just before her discharge from the nursing facility she developed an "knot" which was interpreted as cellulitis on the posterior aspect of her left knee she was given antibiotics. Apparently sometime late in July a this actually opened and became a wound at home health care was tending to however she is still having purulent drainage coming from this and by my understanding the wound depth is actually become unmeasurable. I am not really clear about what home health has been placing in any of these wound areas. The patient states that is something with silver and it. She is not been systemically unwell no fever or chills her appetite is good. She is a diabetic poorly controlled however she states that her recent blood sugars at home have been in the low to mid 100s. 09/28/16 On evaluation today patient appears to continue to exhibit the 3 areas of ulceration that were noted previous. She did have an x-ray of the right pelvis which showed evidence of potential soft tissue infection but no obvious osteomyelitis. There was a discussion last office visit concerning the possibility of a wound VAC. Witth that being said the x-ray report suggested that an MRI may be more appropriate to further evaluate the extent. Subsequently in regard to the wound over the popliteal portion of the left lower extremity with tunneling at 12:00 the CT scan that was ordered was denied by insurance as they state the patient has not had x-rays prior to advanced imaging. Patient states that she is frustrated with the situation overall. 10/05/16 in the interval since I last saw this patient  last week she has had the x-ray of the knee performed. I did review that x-ray today and fortunately shows no evidence of osteomyelitis  or other acute abnormality at this point in time. She continues to have the opening iin the posterior left popliteal space with tracking proximal up the posterior thigh. Nothing seems to have worsened but it also seems to have not improved. The same is true in regard to the right pressure ulcer over the gluteal region which extends toward the ischium. The left gluteal pressure ulcer actually appears to be doing somewhat better my opinion there is some necrotic slough but overall this appears fairly well. She tells me thatt she has some discomfort especially when home health is helping her with dressing changes as they do not know her. At worse she rates her pain to be a 5 out of 10 right now it's more like a 1 out of 10. 10/07/16; still the patient has 3 different wound areas. She has a deep stage IV wound over her right ischial tuberosity. She is due to have an MRI next week. The wound over her left ischial tuberosity is more superficial and underwent debridement today. Finally she has a small open area in her left popliteal fossa the probes on measurably forward superiorly. Still a lot of drainage coming out of this. The CT scan that I ordered 3 weeks ago was questioned by her insurance company wanting a plain x-ray first. As I understand things result of this is nothing has been done in 3 weeks in terms of imaging the thigh and she has an MRI booked of this along with her pelvis for next week line 10/12/16; patient has a deep probing wound over the right ischiall tuberosity, stage III wound over the left visual tuberosity and a draining sinus in her left popliteal fossa. None of this much different from when I saw this 3 weeks ago. We have been using silver rope to the right ischial wound and a draining area in the left popliteal fossa. Plain silver alginate to  the area on the left ischial tuberosity 10/19/16; the patient's wounds are essentially unchanged although the area on the left lower gluteal is actually improved. Our intake nurse noted drainage from the right initial tuberosity probing wound as well as the draining area in the left popliteal fossa. Both of these were cultured. She had x-rays I think at the insistence of her insurance company on 09/23/16 x-ray of the pelvis was not particularly helpful she did have soft tissue air over the right lower pelvis although with the depth of this wound this is not surprising. An x-ray of her left knee did not show any specific abnormalities. We are still using silver alginate to these wound areas. Her MRI is booked for 10/27 10/26/16; cultures of the purulent drainage in her right initial tuberosity wound grew moderate Proteus and few staph aureus. The same organisms were cultured out of the left knee sinus tract posteriorly. The staph aureus is MRSA. I had started her on Augmentin last week I added doxycycline. The MRI of the left lower extremity and pelvis was finally done. The MRI of the femur showed subcutaneous soft tissue swelling Kreeger, Felita [name]. ([id number]) edema fluid and myositis in the vastus lateralis muscle but no soft tissue abscess septic arthritis or osteomyelitis. MRI of the pelvis showed the left wound to be more expensive extending down to the bone there was osteomyelitis. Left hamstring tendons were also involved. No septic arthritis involving the hip. The decubitus ulcer on the right side showed no definite osteomyelitis or abscess.. The right hip wound is actually the one the probes  6 cm downward. But the MRI showing infection including osteomyelitis on the left explains the draining sinus in the popliteal fossa on the left. She did have antibiotics in the hospitalization last time and this extended into her nursing home stay but I'm not exactly sure what antibiotics and for what  duration. According the patient this did include vancomycin with considerable effort of our staff we are able to get the patient into see Dr. Sampson Goon today. There were transportation difficulties. Her mother had open heart surgery and is in the ICU in [place] therefore her brother was unable to transport. Dr. Jarrett Ables office graciously arranged time to see her today. From my point of view she is going to require IV vancomycin plus perhaps a third generation cephalosporin. I plan to keep her on doxycycline and Augmentin until the IV antibiotics can be arranged. 11/02/2016 - Palmer presents today for management of ulcers; She saw Dr. 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 She is to have the PICC line placed today and receive the initial dose for both antibiotics today. She has yet to receive the offloading chair cushion and/or mattress overlay from home health, apparently this has been a 3 week process. I encouraged her to speak to home health regarding this matter, along with offering home health to contact the wouns care center with any questions or concerns. The left ischial pressure ulcer continues to imporve, while to right ischial ulcer has increased in depth. The popliteal fossa sinus tract remains unmeasurable due to the limitation of depth measurement (tract extends beyond our measuring devices). 11-16-2016 Ms. Ausmus presents today for evaluation and management of bilateral ischial stage IV pressure ulcers and sinus tract to the left popliteal fossa. she is under the care of Dr. Sampson Goon for IV antibiotic therapy; she states that the vancomycin was placed on hold and will be restarted at a lower dose based on her renal function. She continues taking Zosyn in addition to the vancomycin. She also states that she has yet to receive offloading cushions from home health, according to her  she does not qualify for these offloading cushions because "the ulcers are unstageable ". We will contact the home health agency today to lend clarity regarding her pressure ulcers. The left popliteal fossa sinus tract continues to be a measurable as it extends beyond the length of our measuring devices.. 11/30/16; the patient is still on vancomycin and Zosyn. The depth of the draining sinus behind her left popliteal fossa is down to 4 cm although there is still serosanguineous drainage coming out of this. She saw Dr. Sampson Goon of infectious disease yesterday the idea is to weeks more of IV antibiotics and then oral antibiotics although I have not read his note. The area on the left gluteal fold is just about healed. She has a 6 cm draining sinus over the right initial tuberosity although I cannot feel bone at the base of this. As far as the patient is aware she has not had a recheck of her inflammatory markers. 12/07/16; patient is on vancomycin and Zosyn appointment with Dr. Sampson Goon on the 19th at which point the patient expects to have a change in antibiotics. Remarkable improvement over the wound over the left ischial tuberosity which is just about closed. The draining sinus in her popliteal fossa has 0.4 cm in depth. The area on the right ischial tuberosity still probes down 7 cm. This is closed and overall  wound dimensions but not depth. 12/14/16; patient is completing her vancomycin and Zosyn and per her she is going to transition to Bactrim and Augmentin for another 3 weeks. The area on her left gluteal fold is closed except for some skin tears. The area behind her left knee is no longer has any depth. The only remaining area that is of clinical concern is on the right gluteal fold probing towards the right ischial tuberosity. Today this measures 6.9 cm in depth. Very gritty surface 12/21/16; patient is now on Bactrim and Augmentin as directed by infectious disease. This should be  for another 2 weeks. The area in her left gluteal fold and left popliteal are closed over and fully healed. [name], [name]. ([id number]) Measurements today at 7 cm in the right buttock wound is unchanged from last week. 12/27/16; patient is on Bactrim and Augmentin for another week as directed by infectious disease. She has completed her IV antibiotics. She is not been systemically unwell no fever no chills. The area on her right buttock measured over 6 cm in depth. There is no palpable bone. No evidence of surrounding soft tissue infection. She is complaining of tongue irritation and has a history of thrush 01/04/17; patient is been back to see Dr. Sampson Goon, her Augmentin was stopped but he continued the Bactrim for another 3 weeks. Depth of the wound is 6.7 cm there is been no major change in either direction. She is not receive the wound VAC from home health I think because of confusion about who is supposed to provided will actually talk to the [facility] agency [facility] The net no major change. 01/18/17; patient obtained her wound VAC about 10 days ago however for some reason it was not actually put on the wound. She is therefore here for [place] to apply this I guess. No other issues are noted. She is not complaining of pain fever drainage 02/01/17; patient is here now having the wound VAC or 3-4 weeks to a deep pressure area over the right initial tuberosity. This measures 6 cm in depth today which is about half a centimeter better than 2 weeks ago. There is no evidence she is systemically unwell no fever no chills no pain around the area. 02/15/17; I follow this patient every 2 weeks for a deep area over the right ischial tuberosity. This measures 5.5 cm today which is a continued improvement of 1.2 cm from 1/10 and down 0.5 cm from her visit 2 weeks ago 03/01/17; continued difficult area over the right initial tuberosity using the wound VAC. Depth today of 5.1 cm which is improved. Does not  appear to be a lot of drainage in the canister. Antibiotics were finally stopped by Dr. Sampson Goon bactrim[]  and inflammatory markers have been repeated 03/15/17; fall this lady every 2 weeks for a difficult area over her lower right gluteal area/ischial tuberosity. Depth today at 4.6 cm. This is a slow but steady improvement in the depth of this wound. Although we have labeled this as a pressure ulcer there may have been an underlying infection here at one point before we saw her. She had osteomyelitis on the left extensively which is since resolved 03/29/17- patient is here for follow-up evaluation of her right ischial pressure ulcer. She continues with the wound VAC and home health. According to the nurse home health has been using less foams and appropriate and we will instruct accordingly. The patient continues to smoke, approximately 10 cigarettes a day. She has been  advised to decrease that in half by her next appointment, with a goal of complete cessation. She states her blood sugars have been consistently less than 150. She states that she spends most of her day position left lateral or prone. She does have an air mattress on her bed, she does not have an mattress for her chair, she states she cannot afford this. 04/12/17; patient is here for evaluation of her right ischial pressure ulcer. We continue to use a wound VAC with minimal improvement today the depth of this measuring 4.4 cm versus 4.6 cm 2 weeks ago. We are using a [employer] wound VAC on this area. There is not excessive drainage no pain. The patient tells me she tries to keep off this in bed but is up in the wheelchair for 2 hours a day. She is limited in no her overall ambulation but is improving and apparently is getting bilateral lower extremity braces which she hopes will improve her ability to walk independently. Electronic Signature(s) Signed: 04/12/2017 5:12:22 PM By: Baltazar Najjar MD Entered By: Baltazar Najjar on 04/12/2017  08:33:14 [name], [name] ([id number]) -------------------------------------------------------------------------------- Physical Exam Details Patient Name: [name] Date of Service: 04/12/2017 8:00 AM Medical Record Patient Account Number: [id number] Number: Treating RN: Phillis Haggis 31-Jul-1961 (56 y.o. Other Clinician: Date of Birth/Sex: Female) Treating Jayce Kainz Primary Care Provider: Joen [name] Provider/Extender: G Referring Provider: Tedra Senegal in Treatment: 29 Constitutional Sitting or standing Blood Pressure is within target range for patient.. Pulse regular and within target range for patient.Marland Kitchen Respirations regular, non-labored and within target range.. Temperature is normal and within the target range for the patient.. Patient's appearance is neat and clean. Appears in no acute distress. Well nourished and well developed.. Notes Wound exam; the patient has a deep probing wound measuring 4.4 cm. The base of this is clearly not visible however using an open curet there is again gritty necrotic material that I removed. There is no bleeding. There is no evidence of surrounding soft tissue infection no soft tissue crepitus and no tenderness Electronic Signature(s) Signed: 04/12/2017 5:12:22 PM By: Baltazar Najjar MD Entered By: Baltazar Najjar on 04/12/2017 08:34:28 [name], [name] ([id number]) -------------------------------------------------------------------------------- Physician Orders Details Patient Name: [name] Date of Service: 04/12/2017 8:00 AM Medical Record Patient Account Number: [id number] Number: Treating RN: Phillis Haggis 09/10/1961 (56 y.o. Other Clinician: Date of Birth/Sex: Female) Treating Elexius Minar Primary Care Provider: Joen [name] Provider/Extender: G Referring Provider: Tedra Senegal in Treatment: 95 Verbal / Phone Orders: Yes Clinician: Ashok Cordia, Debi Read Back and Verified:  Yes Diagnosis Coding Wound Cleansing Wound #3 Right Gluteal fold o Clean wound with Normal Saline. o Cleanse wound with mild soap and water Anesthetic Wound #3 Right Gluteal fold o Topical Lidocaine 4% cream applied to wound bed prior to debridement - clinic use only Skin Barriers/Peri-Wound Care Wound #3 Right Gluteal fold o Skin Prep Primary Wound Dressing Wound #3 Right Gluteal fold o Prisma Ag - (silver collagen) moisten with saline place in wound all the way to wound bed under wound vac Dressing Change Frequency Wound #3 Right Gluteal fold o Three times weekly - HHRN to change MONDAY, and FRIDAY and pt to come to wound clinic on [facility], every other week. Follow-up Appointments Wound #3 Right Gluteal fold o Return Appointment in 2 weeks. Off-Loading Wound #3 Right Gluteal fold o Roho cushion for wheelchair - *********[facility] to order*********** Home Health Wound #3 Right Gluteal fold [name], [name]. (  [id number]) o Continue Home Health Visits - [facility] *****Please order [facility] cushion****** o Home Health Nurse may visit PRN to address patientos wound care needs. o FACE TO FACE ENCOUNTER: MEDICARE and MEDICAID PATIENTS: I certify that this patient is under my care and that I had a face-to-face encounter that meets the physician face-to-face encounter requirements with this patient on this date. The encounter with the patient was in whole or in part for the following MEDICAL CONDITION: (primary reason for Home Healthcare) MEDICAL NECESSITY: I certify, that based on my findings, NURSING services are a medically necessary home health service. HOME BOUND STATUS: I certify that my clinical findings support that this patient is homebound (i.e., Due to illness or injury, pt requires aid of supportive devices such as crutches, cane, wheelchairs, walkers, the use of special transportation or the assistance of another person to leave their  place of residence. There is a normal inability to leave the home and doing so requires considerable and taxing effort. Other absences are for medical reasons / religious services and are infrequent or of short duration when for other reasons). o If current dressing causes regression in wound condition, may D/C ordered dressing product/s and apply Normal Saline Moist Dressing daily until next Wound Healing Center / Other MD appointment. Notify Wound Healing Center of regression in wound condition at [phone]. o Please direct any NON-WOUND related issues/requests for orders to patient's Primary Care Physician Negative Pressure Wound Therapy Wound #3 Right Gluteal fold o Wound VAC settings at 125/130 mmHg continuous pressure. Use BLACK/GREEN foam to wound cavity. Use WHITE foam to fill any tunnel/s and/or undermining. Change VAC dressing 3 X WEEK. Change canister as indicated when full. Nurse may titrate settings and frequency of dressing changes as clinically indicated. - Prisma Ag (silver collagen) moisten with saline place in wound all the way to wound bed under wound vac then white foam o Home Health Nurse may d/c VAC for s/s of increased infection, significant wound regression, or uncontrolled drainage. Not[facility] at [phone]. o Number of foam/gauze pieces used in the dressing = Medications-please add to medication list. Wound #3 Right Gluteal fold o Other: - Vitamin C, Zinc, Multivitamins Electronic Signature(s) Signed: 04/21/2017 4:51:44 PM By: Alejandro Mulling Signed: 04/24/2017 6:30:25 PM By: Baltazar Najjar MD Previous Signature: 04/12/2017 4:28:23 PM Version By: Alejandro Mulling Previous Signature: 04/12/2017 5:12:22 PM Version By: Baltazar Najjar MD Entered By: Alejandro Mulling on 04/19/2017 12:33:19 [name], [name] ([id number]) -------------------------------------------------------------------------------- Problem List Details Patient Name:  [name] Date of Service: 04/12/2017 8:00 AM Medical Record Patient Account Number: [id number] Number: Treating RN: Phillis Haggis March 22, 1961 (56 y.o. 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 ([id number]) -------------------------------------------------------------------------------- Progress Note Details Patient Name: Kohles,  [name]. Date of Service: 04/12/2017 8:00 AM Medical Record Patient Account Number: [id number] Number: Treating RN: Phillis Haggis 04/21/1961 (56 y.o. Other Clinician: Date of Birth/Sex: Female) Treating Ramsie Ostrander Primary Care Provider: Joen [name] Provider/Extender: G Referring Provider: Tedra Senegal in Treatment: 29 Subjective Chief Complaint Information obtained from Patient Patient is here for follow-up of a relation of her right ischial pressure ulcer History of Present Illness (HPI) The following HPI elements were documented for the patient's wound: Location:  bilateral gluteal ulcerations and left popliteal ulceration with tunneling Quality: adits to intermittent aching to ischial ulcers, no discomfort to sinus tract Severity: right iscial ulcer with increased depth Duration: chronic ulcers to bilateral ischial ulcers and sinus tract Timing: The pain is intermittent in severity as far as how intense it becomes but is present all the time. Manipulationn makes this worse. Context: The wound occurred when the patient had a fall and was unconscious for about 48 hours laying on the floor and she had pressure injury at that stage. Modifying Factors: Other treatment(s) tried include:as noted below she has been seen by visiting wound care physicians or nurse practitioners and details have been noted Associated Signs and Symptoms: has yet to receive offloading cushions and mattress overlay 56 year old patient who was seen by visiting [name] wound care specialist for a wound on both her buttock and was found to have an unstageable wound on the right buttock for about 2 months. I understand that she had a fall and was laying on the floor for about 48 hours before she was found and taken to the ICU and had a long injury to her gluteal area from pressure and also had broken her right humerus. She has had a right proximal humerus fracture and has been followed up with orthopedics recently. The patient has a past medical history of type 2 diabetes mellitus, paraparesis, acute pyelonephritis, GERD, hypertension, glaucoma, chronic pain, anxiety neurosis, nicotine dependence, COPD. the patient had some debridement done and was to operative was recommended to use Silvadene dressing and offloading. She is a smoker and occasionally smokes a few cigarettes. the patient requested a second opinion for months and is here to discuss her care. 09/21/16; the patient re-presents from home today for review of 3 different wounds. I note that she was seen in the clinic here in July  at which time she had bilateral buttock wounds. It was apparently suggested at that time that she use a wound VAC bridged to both wounds just near the initial tuberosity's bilaterally which she refused. The history was a bit difficult to put together. Apparently this patient became ill at the end of April of this ANELISSE, JACOBSON. ([id number]) year. She was found sitting on the floor she had apparently been for 2 days and subsequently admitted to hospital from 04/23/16 through 05/02/16 and at that point she was critically ill ultimately having sepsis secondary to UTI, nontraumatic rhabdomyolysis and diabetic ketoacidosis. She had acute renal failure and I think required ICU care including intubation. Patient states her wounds actually started at that point on the bilateral issue tuberosities however in reviewing the discharge summary from 5/8 I see no reference to wounds at that point. It did state that she had left lower extremity cellulitis however. Reviewing Epic I see no relevant x-rays. It would appear that her discharge creatinine was within the normal range and indeed on 9/15 her creatinine has remained normal. [facility] There the  wounds on her bilateral Buttocks were dressed. Only just before her discharge from the nursing facility she developed an "knot" which was interpreted as cellulitis on the posterior aspect of her left knee she was given antibiotics. Apparently sometime late in July a this actually opened and became a wound at home health care was tending to however she is still having purulent drainage coming from this and by my understanding the wound depth is actually become unmeasurable. I am not really clear about what home health has been placing in any of these wound areas. The patient states that is something with silver and it. She is not been systemically unwell no fever or chills her appetite is good. She is a  diabetic poorly controlled however she states that her recent blood sugars at home have been in the low to mid 100s. 09/28/16 On evaluation today patient appears to continue to exhibit the 3 areas of ulceration that were noted previous. She did have an x-ray of the right pelvis which showed evidence of potential soft tissue infection but no obvious osteomyelitis. There was a discussion last office visit concerning the possibility of a wound VAC. Witth that being said the x-ray report suggested that an MRI may be more appropriate to further evaluate the extent. Subsequently in regard to the wound over the popliteal portion of the left lower extremity with tunneling at 12:00 the CT scan that was ordered was denied by insurance as they state the patient has not had x-rays prior to advanced imaging. Patient states that she is frustrated with the situation overall. 10/05/16 in the interval since I last saw this patient last week she has had the x-ray of the knee performed. I did review that x-ray today and fortunately shows no evidence of osteomyelitis or other acute abnormality at this point in time. She continues to have the opening iin the posterior left popliteal space with tracking proximal up the posterior thigh. Nothing seems to have worsened but it also seems to have not improved. The same is true in regard to the right pressure ulcer over the gluteal region which extends toward the ischium. The left gluteal pressure ulcer actually appears to be doing somewhat better my opinion there is some necrotic slough but overall this appears fairly well. She tells me thatt she has some discomfort especially when home health is helping her with dressing changes as they do not know her. At worse she rates her pain to be a 5 out of 10 right now it's more like a 1 out of 10. 10/07/16; still the patient has 3 different wound areas. She has a deep stage IV wound over her right ischial tuberosity. She is due  to have an MRI next week. The wound over her left ischial tuberosity is more superficial and underwent debridement today. Finally she has a small open area in her left popliteal fossa the probes on measurably forward superiorly. Still a lot of drainage coming out of this. The CT scan that I ordered 3 weeks ago was questioned by her insurance company wanting a plain x-ray first. As I understand things result of this is nothing has been done in 3 weeks in terms of imaging the thigh and she has an MRI booked of this along with her pelvis for next week line 10/12/16; patient has a deep probing wound over the right ischiall tuberosity, stage III wound over the left visual tuberosity and a draining sinus in her left popliteal fossa. None of this much  different from when I saw this 3 weeks ago. We have been using silver rope to the right ischial wound and a draining area in the left popliteal fossa. Plain silver alginate to the area on the left ischial tuberosity 10/19/16; the patient's wounds are essentially unchanged although the area on the left lower gluteal is actually improved. Our intake nurse noted drainage from the right initial tuberosity probing wound as well as the draining area in the left popliteal fossa. Both of these were cultured. She had x-rays I think at the [place], CONNER NEISS. ([id number]) insistence of her insurance company on 09/23/16 x-ray of the pelvis was not particularly helpful she did have soft tissue air over the right lower pelvis although with the depth of this wound this is not surprising. An x-ray of her left knee did not show any specific abnormalities. We are still using silver alginate to these wound areas. Her MRI is booked for 10/27 10/26/16; cultures of the purulent drainage in her right initial tuberosity wound grew moderate Proteus and few staph aureus. The same organisms were cultured out of the left knee sinus tract posteriorly. The staph aureus is MRSA. I had started  her on Augmentin last week I added doxycycline. The MRI of the left lower extremity and pelvis was finally done. The MRI of the femur showed subcutaneous soft tissue swelling edema fluid and myositis in the vastus lateralis muscle but no soft tissue abscess septic arthritis or osteomyelitis. MRI of the pelvis showed the left wound to be more expensive extending down to the bone there was osteomyelitis. Left hamstring tendons were also involved. No septic arthritis involving the hip. The decubitus ulcer on the right side showed no definite osteomyelitis or abscess.. The right hip wound is actually the one the probes 6 cm downward. But the MRI showing infection including osteomyelitis on the left explains the draining sinus in the popliteal fossa on the left. She did have antibiotics in the hospitalization last time and this extended into her nursing home stay but I'm not exactly sure what antibiotics and for what duration. According the patient this did include vancomycin with considerable effort of our staff we are able to get the patient into see Dr. Sampson Goon today. There were transportation difficulties. Her mother had open heart surgery and is in the ICU in [place] therefore her brother was unable to transport. Dr. Jarrett Ables office graciously arranged time to see her today. From my point of view she is going to require IV vancomycin plus perhaps a third generation cephalosporin. I plan to keep her on doxycycline and Augmentin until the IV antibiotics can be arranged. 11/02/2016 - [name] presents today for management of ulcers; She saw Dr. 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 She is to have the PICC line placed today and receive the initial dose for both antibiotics today. She has yet to receive the offloading chair cushion and/or mattress overlay from home health, apparently this has  been a 3 week process. I encouraged her to speak to home health regarding this matter, along with offering home health to contact the wouns care center with any questions or concerns. The left ischial pressure ulcer continues to imporve, while to right ischial ulcer has increased in depth. The popliteal fossa sinus tract remains unmeasurable due to the limitation of depth measurement (tract extends beyond our measuring devices). 11-16-2016 Ms. Peckham presents today for evaluation and management of bilateral ischial  stage IV pressure ulcers and sinus tract to the left popliteal fossa. she is under the care of Dr. Sampson Goon for IV antibiotic therapy; she states that the vancomycin was placed on hold and will be restarted at a lower dose based on her renal function. She continues taking Zosyn in addition to the vancomycin. She also states that she has yet to receive offloading cushions from home health, according to her she does not qualify for these offloading cushions because "the ulcers are unstageable ". We will contact the home health agency today to lend clarity regarding her pressure ulcers. The left popliteal fossa sinus tract continues to be a measurable as it extends beyond the length of our measuring devices.. 11/30/16; the patient is still on vancomycin and Zosyn. The depth of the draining sinus behind her left popliteal fossa is down to 4 cm although there is still serosanguineous drainage coming out of this. She saw Dr. Sampson Goon of infectious disease yesterday the idea is to weeks more of IV antibiotics and then oral antibiotics although I have not read his note. The area on the left gluteal fold is just about healed. She has a 6 cm draining sinus over the right initial tuberosity although I cannot feel bone at the base of this. As far as the patient is aware she has not had a recheck of her inflammatory markers. 12/07/16; patient is on vancomycin and Zosyn appointment with Dr.  Sampson Goon on the 19th at which point the patient expects to have a change in antibiotics. Remarkable improvement over the wound over the left ischial tuberosity which is just about closed. The draining sinus in her popliteal fossa has 0.4 cm in depth. The area on the right ischial tuberosity still probes down 7 cm. This is closed and overall wound dimensions [name], [name]. ([id number]) but not depth. 12/14/16; patient is completing her vancomycin and Zosyn and per her she is going to transition to Bactrim and Augmentin for another 3 weeks. The area on her left gluteal fold is closed except for some skin tears. The area behind her left knee is no longer has any depth. The only remaining area that is of clinical concern is on the right gluteal fold probing towards the right ischial tuberosity. Today this measures 6.9 cm in depth. Very gritty surface 12/21/16; patient is now on Bactrim and Augmentin as directed by infectious disease. This should be for another 2 weeks. The area in her left gluteal fold and left popliteal are closed over and fully healed. Measurements today at 7 cm in the right buttock wound is unchanged from last week. 12/27/16; patient is on Bactrim and Augmentin for another week as directed by infectious disease. She has completed her IV antibiotics. She is not been systemically unwell no fever no chills. The area on her right buttock measured over 6 cm in depth. There is no palpable bone. No evidence of surrounding soft tissue infection. She is complaining of tongue irritation and has a history of thrush 01/04/17; patient is been back to see Dr. Sampson Goon, her Augmentin was stopped but he continued the Bactrim for another 3 weeks. Depth of the wound is 6.7 cm there is been no major change in either direction. She is not receive the wound VAC from home health I think because of confusion about who is supposed to provided will actually talk to the [facility] agency [facility]  The net no major change. 01/18/17; patient obtained her wound VAC about 10 days ago however  for some reason it was not actually put on the wound. She is therefore here for [place] to apply this I guess. No other issues are noted. She is not complaining of pain fever drainage 02/01/17; patient is here now having the wound VAC or 3-4 weeks to a deep pressure area over the right initial tuberosity. This measures 6 cm in depth today which is about half a centimeter better than 2 weeks ago. There is no evidence she is systemically unwell no fever no chills no pain around the area. 02/15/17; I follow this patient every 2 weeks for a deep area over the right ischial tuberosity. This measures 5.5 cm today which is a continued improvement of 1.2 cm from 1/10 and down 0.5 cm from her visit 2 weeks ago 03/01/17; continued difficult area over the right initial tuberosity using the wound VAC. Depth today of 5.1 cm which is improved. Does not appear to be a lot of drainage in the canister. Antibiotics were finally stopped by Dr. Sampson Goon bactrim[]  and inflammatory markers have been repeated 03/15/17; fall this lady every 2 weeks for a difficult area over her lower right gluteal area/ischial tuberosity. Depth today at 4.6 cm. This is a slow but steady improvement in the depth of this wound. Although we have labeled this as a pressure ulcer there may have been an underlying infection here at one point before we saw her. She had osteomyelitis on the left extensively which is since resolved 03/29/17- patient is here for follow-up evaluation of her right ischial pressure ulcer. She continues with the wound VAC and home health. According to the nurse home health has been using less foams and appropriate and we will instruct accordingly. The patient continues to smoke, approximately 10 cigarettes a day. She has been advised to decrease that in half by her next appointment, with a goal of complete cessation. She states her blood  sugars have been consistently less than 150. She states that she spends most of her day position left lateral or prone. She does have an air mattress on her bed, she does not have an mattress for her chair, she states she cannot afford this. 04/12/17; patient is here for evaluation of her right ischial pressure ulcer. We continue to use a wound VAC with minimal improvement today the depth of this measuring 4.4 cm versus 4.6 cm 2 weeks ago. We are using a KCI wound VAC on this area. There is not excessive drainage no pain. The patient tells me she tries to keep off this in bed but is up in the wheelchair for 2 hours a day. She is limited in no her overall ambulation but is improving and apparently is getting bilateral lower extremity braces which she hopes will improve her ability to walk independently. Gutknecht, Jevon [name]. ([id number]) Objective Constitutional Sitting or standing Blood Pressure is within target range for patient.. Pulse regular and within target range for patient.Marland Kitchen Respirations regular, non-labored and within target range.. Temperature is normal and within the target range for the patient.. Patient's appearance is neat and clean. Appears in no acute distress. Well nourished and well developed.. Vitals Time Taken: 8:05 AM, Height: 63 in, Weight: 257 lbs, BMI: 45.5, Temperature: 97.9 F, Pulse: 91 bpm, Respiratory Rate: 18 breaths/min, Blood Pressure: 122/44 mmHg. General Notes: Wound exam; the patient has a deep probing wound measuring 4.4 cm. The base of this is clearly not visible however using an open curet there is again gritty necrotic material that I removed. There is  no bleeding. There is no evidence of surrounding soft tissue infection no soft tissue crepitus and no tenderness Integumentary (Hair, Skin) Wound #3 status is Open. Original cause of wound was Pressure Injury. The wound is located on the Right Gluteal fold. The wound measures 1cm length x 2cm width x 4.4cm depth;  1.571cm^2 area and 6.912cm^3 volume. There is Fat Layer (Subcutaneous Tissue) Exposed exposed. There is no tunneling or undermining noted. There is a large amount of serous drainage noted. The wound margin is distinct with the outline attached to the wound base. There is medium (34-66%) red granulation within the wound bed. There is a medium (34-66%) amount of necrotic tissue within the wound bed including Adherent Slough. The periwound skin appearance exhibited: Scarring, Maceration. The periwound skin appearance did not exhibit: Callus, Crepitus, Excoriation, Induration, Rash, Dry/Scaly, Atrophie Blanche, Cyanosis, Ecchymosis, Hemosiderin Staining, Mottled, Pallor, Rubor, Erythema. Periwound temperature was noted as No Abnormality. The periwound has tenderness on palpation. Assessment Active Problems ICD-10 L89.314 - Pressure ulcer of right buttock, stage 4 E11.42 - Type 2 diabetes mellitus with diabetic polyneuropathy Manuelito, Weslynn [name]. ([id number]) Procedures Wound #3 Wound #3 is a Pressure Ulcer located on the Right Gluteal fold . There was a Skin/Subcutaneous Tissue Debridement ([id number]) debridement with total area of 2 sq cm performed by Maxwell Caul, MD. with the following instrument(s): scoop to remove Viable and Non-Viable tissue/material including Exudate, Fibrin/Slough, and Subcutaneous after achieving pain control using Lidocaine 4% Topical Solution. A time out was conducted at 08:21, prior to the start of the procedure. A Minimum amount of bleeding was controlled with Pressure. The procedure was tolerated well with a pain level of 0 throughout and a pain level of 0 following the procedure. Post Debridement Measurements: 1cm length x 2cm width x 4.4cm depth; 6.912cm^3 volume. Post debridement Stage noted as Category/Stage IV. Character of Wound/Ulcer Post Debridement requires further debridement. Severity of Tissue Post Debridement is: Fat layer exposed. Post procedure  Diagnosis Wound #3: Same as Pre-Procedure Plan Wound Cleansing: Wound #3 Right Gluteal fold: Clean wound with Normal Saline. Cleanse wound with mild soap and water Anesthetic: Wound #3 Right Gluteal fold: Topical Lidocaine 4% cream applied to wound bed prior to debridement - clinic use only Skin Barriers/Peri-Wound Care: Wound #3 Right Gluteal fold: Skin Prep Primary Wound Dressing: Wound #3 Right Gluteal fold: Aquacel Ag - (silver collagen) moisten with saline place in wound all the way to wound bed under wound vac Dressing Change Frequency: Wound #3 Right Gluteal fold: Three times weekly - HHRN to change MONDAY, and FRIDAY and pt to come to wound clinic on [facility], every other week. Follow-up Appointments: Wound #3 Right Gluteal fold: Return Appointment in 2 weeks. Off-Loading: Wound #3 Right Gluteal fold: Roho cushion for wheelchair - *********[facility] to order*********** Home Health: Wound #3 Right Gluteal fold: Continue Home Health Visits - [facility] *****Please order ROHO cushion****** MERON, BOCCHINO ([id number]) Home Health Nurse may visit PR[facility] to address patient s wound care needs. FACE TO FACE ENCOUNTER: MEDICARE and MEDICAID PATIENTS: I certify that this patient is under my care and that I had a face-to-face encounter that meets the physician face-to-face encounter requirements with this patient on this date. The encounter with the patient was in whole or in part for the following MEDICAL CONDITION: (primary reason for Home Healthcare) MEDICAL NECESSITY: I certify, that based on my findings, NURSING services are a medically necessary home health service. HOME BOUND STATUS: I certify that my  clinical findings support that this patient is homebound (i.e., Due to illness or injury, pt requires aid of supportive devices such as crutches, cane, wheelchairs, walkers, the use of special transportation or the assistance of another person to leave their  place of residence. There is a normal inability to leave the home and doing so requires considerable and taxing effort. Other absences are for medical reasons / religious services and are infrequent or of short duration when for other reasons). If current dressing causes regression in wound condition, may D/C ordered dressing product/s and apply Normal Saline Moist Dressing daily until next Wound Healing Center / Other MD appointment. Notify Wound Healing Center of regression in wound condition at [phone]. Please direct any NON-WOUND related issues/requests for orders to patient's Primary Care Physician Negative Pressure Wound Therapy: Wound #3 Right Gluteal fold: Wound VAC settings at 125/130 mmHg continuous pressure. Use BLACK/GREEN foam to wound cavity. Use WHITE foam to fill any tunnel/s and/or undermining. Change VAC dressing 3 X WEEK. Change canister as indicated when full. Nurse may titrate settings and frequency of dressing changes as clinically indicated. - Prisma Ag (silver collagen) moisten with saline place in wound all the way to wound bed under wound vac then Home Health Nurse may d/c VAC for s/s of increased infection, significant wound regression, or uncontrolled drainage. Notify Wound Healing Center at [phone]. Number of foam/gauze pieces used in the dressing = Medications-please add to medication list.: Wound #3 Right Gluteal fold: Other: - Vitamin C, Zinc, Multivitamins #1 wound is debrided. There is clearly a nonviable surface to the base and walls of this wound. I have added Prisma under the wound VAC foam. #2 the only other option I can think of year is plastic surgery consultation. I'm not sure if the patient would consent to further surgery however this may be something we want to discuss. #3 I see no evidence of infection here. 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 She did have an  MRI which I will review with regards to her right ischial tuberosity although there is never been anything clinically in this area other than a refractory wound to make an infection obvious Electronic Signature(s) Signed: 04/12/2017 5:12:22 PM By: Baltazar Najjar MD Entered By: Baltazar Najjar on 04/12/2017 08:36:06 Berkovich, [name] ([id number]) Samons, [name] ([id number]) -------------------------------------------------------------------------------- SuperBill Details Patient Name: [name] Date of Service: 04/12/2017 Medical Record Patient Account Number: [id number] Number: Treating RN: Phillis Haggis 12-08-1961 (56 y.o. 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

## 2017-04-13 NOTE — Progress Notes (Signed)
IVONE, LICHT ([id number]) Visit Report for 04/12/2017 Arrival Information Details Patient Name: [name], [name] Date of Service: 04/12/2017 8:00 AM Medical Record Patient Account Number: [id number] Number: Treating RN: Ahmed Prima 1961-12-23 (56 y.o. Other Clinician: Date of Birth/Sex: Female) Treating ROBSON, MICHAEL Primary Care Siddhi Dornbush: Lavera Guise Dauna Ziska/Extender: G Referring Deshan Hemmelgarn: Sallee Lange in Treatment: 64 Visit Information History Since Last Visit All ordered tests and consults were completed: No Patient Arrived: Wheel Chair Added or deleted any medications: No Arrival Time: 08:02 Any new allergies or adverse reactions: No Accompanied By: self Had a fall or experienced change in No Transfer Assistance: EasyPivot activities of daily living that may affect Patient Lift risk of falls: Patient Identification Verified: Yes Signs or symptoms of abuse/neglect since last No Secondary Verification Process Yes visito Completed: Hospitalized since last visit: No Patient Requires Transmission- No Has Dressing in Place as Prescribed: Yes Based Precautions: Pain Present Now: No Patient Has Alerts: Yes Patient Alerts: DM II Electronic Signature(s) Signed: 04/12/2017 4:28:23 PM By: Alric Quan Entered By: Alric Quan on 04/12/2017 08:05:40 Swenson, [name] ([id number]) -------------------------------------------------------------------------------- Encounter Discharge Information Details Patient Name: [name] Date of Service: 04/12/2017 8:00 AM Medical Record Patient Account Number: [id number] Number: Treating RN: Ahmed Prima 1961/01/23 (56 y.o. Other Clinician: Date of Birth/Sex: Female) Treating ROBSON, MICHAEL Primary Care Antavious Spanos: Lavera Guise Keegan Bensch/Extender: G Referring Amaad Byers: Sallee Lange in Treatment: 29 Encounter Discharge Information Items Discharge Pain Level: 0 Discharge Condition:  Stable Ambulatory Status: Wheelchair Discharge Destination: Home Transportation: Other Accompanied By: self Schedule Follow-up Appointment: Yes Medication Reconciliation completed and provided to Patient/Care No Cassidi Modesitt: Provided on Clinical Summary of Care: 04/12/2017 Form Type Recipient Paper Patient TW Electronic Signature(s) Signed: 04/12/2017 8:43:49 AM By: Ruthine Dose Entered By: Ruthine Dose on 04/12/2017 08:43:49 Wernli, [name] ([id number]) -------------------------------------------------------------------------------- Lower Extremity Assessment Details Patient Name: [name] Date of Service: 04/12/2017 8:00 AM Medical Record Patient Account Number: [id number] Number: Treating RN: Ahmed Prima 05-30-1961 (56 y.o. Other Clinician: Date of Birth/Sex: Female) Treating ROBSON, MICHAEL Primary Care Jeslie Lowe: Lavera Guise Fancy Dunkley/Extender: G Referring Audriella Blakeley: Sallee Lange in Treatment: 29 Electronic Signature(s) Signed: 04/12/2017 4:28:23 PM By: Alric Quan Entered By: Alric Quan on 04/12/2017 08:15:47 Peil, Osprey ([id number]) -------------------------------------------------------------------------------- Multi Wound Chart Details Patient Name: [name] Date of Service: 04/12/2017 8:00 AM Medical Record Patient Account Number: [id number] Number: Treating RN: Ahmed Prima 01-05-1961 (56 y.o. Other Clinician: Date of Birth/Sex: Female) Treating ROBSON, MICHAEL Primary Care Hser Belanger: Lavera Guise Dhyana Bastone/Extender: G Referring Ralynn San: Sallee Lange in Treatment: 29 Vital Signs Height(in): 63 Pulse(bpm): 91 Weight(lbs): 257 Blood Pressure 122/44 (mmHg): Body Mass Index(BMI): 46 Temperature(F): 97.9 Respiratory Rate 18 (breaths/min): Photos: [3:No Photos] [N/A:N/A] Wound Location: [3:Right Gluteal fold] [N/A:N/A] Wounding Event: [3:Pressure Injury] [N/A:N/A] Primary Etiology: [3:Pressure  Ulcer] [N/A:N/A] Comorbid History: [3:Asthma, Hypertension, Type II Diabetes, Neuropathy] [N/A:N/A] Date Acquired: [3:04/21/2016] [N/A:N/A] Weeks of Treatment: [3:29] [N/A:N/A] Wound Status: [3:Open] [N/A:N/A] Measurements L x W x D 1x2x4.4 [N/A:N/A] (cm) Area (cm) : [3:1.571] [N/A:N/A] Volume (cm) : [3:6.912] [N/A:N/A] % Reduction in Area: [3:48.80%] [N/A:N/A] % Reduction in Volume: 59.80% [N/A:N/A] Classification: [3:Category/Stage IV] [N/A:N/A] Exudate Amount: [3:Large] [N/A:N/A] Exudate Type: [3:Serous] [N/A:N/A] Exudate Color: [3:amber] [N/A:N/A] Foul Odor After [3:Yes] [N/A:N/A] Cleansing: Odor Anticipated Due to No [N/A:N/A] Product Use: Wound Margin: [3:Distinct, outline attached] [N/A:N/A] Granulation Amount: [3:Medium (34-66%)] [N/A:N/A] Granulation Quality: [3:Red] [N/A:N/A] Necrotic Amount: [3:Medium (34-66%)] [N/A:N/A] Exposed Structures: Fat Layer (Subcutaneous N/A N/A Tissue) Exposed: Yes Epithelialization:  None N/A N/A Debridement: Debridement (25053- N/A N/A 11047) Pre-procedure 08:21 N/A N/A Verification/Time Out Taken: Pain Control: Lidocaine 4% Topical N/A N/A Solution Tissue Debrided: Fibrin/Slough, Exudates, N/A N/A Subcutaneous Level: Skin/Subcutaneous N/A N/A Tissue Debridement Area (sq 2 N/A N/A cm): Instrument: Other(scoop) N/A N/A Bleeding: Minimum N/A N/A Hemostasis Achieved: Pressure N/A N/A Procedural Pain: 0 N/A N/A Post Procedural Pain: 0 N/A N/A Debridement Treatment Procedure was tolerated N/A N/A Response: well Post Debridement 1x2x4.4 N/A N/A Measurements L x W x D (cm) Post Debridement 6.912 N/A N/A Volume: (cm) Post Debridement Category/Stage IV N/A N/A Stage: Periwound Skin Texture: Scarring: Yes N/A N/A Excoriation: No Induration: No Callus: No Crepitus: No Rash: No Periwound Skin Maceration: Yes N/A N/A Moisture: Dry/Scaly: No Periwound Skin Color: Atrophie Blanche: No N/A N/A Cyanosis: No Ecchymosis:  No Erythema: No Hemosiderin Staining: No Mottled: No Pallor: No Rubor: No Temperature: No Abnormality N/A N/A Tenderness on Yes N/A N/A Palpation: Wound Preparation: N/A N/A Yum, Davina L. ([id number]) Ulcer Cleansing: Rinsed/Irrigated with Saline Topical Anesthetic Applied: Other: lidocaine 4% Procedures Performed: Debridement N/A N/A Treatment Notes Wound #3 (Right Gluteal fold) 1. Cleansed with: Clean wound with Normal Saline 2. Anesthetic Topical Lidocaine 4% cream to wound bed prior to debridement 3. Peri-wound Care: Skin Prep 4. Dressing Applied: Prisma Ag 8. Negative Pressure Wound Therapy Wound Vac to wound continuously at 158m/hg pressure Number of foam/gauze pieces used in the dressing = Notes 1ZillahSignature(s) Signed: 04/12/2017 5:12:22 PM By: RLinton HamMD Entered By: RLinton Hamon 04/12/2017 08:28:41 Corning, TGeorgia Dom([id number] -------------------------------------------------------------------------------- MWiley FordDetails Patient Name: WBenedetto GoadDate of Service: 04/12/2017 8:00 AM Medical Record Patient Account Number: [id number]: Treating RN: PAhmed Prima304-01-1961(56y.o. Other Clinician: Date of Birth/Sex: Female) Treating ROBSON, MICHAEL Primary Care Jaiona Simien: BLavera GuiseProvider/Extender: G Referring Vangie Henthorn: BSallee Langein Treatment: 230Active Inactive ` Abuse / Safety / Falls / Self Care Management Nursing Diagnoses: Potential for falls Goals: Patient will remain injury free Date Initiated: 09/21/2016 Target Resolution Date: 03/25/2017 Goal Status: Active Interventions: Assess fall risk on admission and as needed Notes: ` Nutrition Nursing Diagnoses: Imbalanced nutrition Potential for alteratiion in Nutrition/Potential for imbalanced nutrition Goals: Patient/caregiver agrees to and verbalizes understanding of need to use  nutritional supplements and/or vitamins as prescribed Date Initiated: 09/21/2016 Target Resolution Date: 03/25/2017 Goal Status: Active Patient/caregiver verbalizes understanding of need to maintain therapeutic glucose control per primary care physician Date Initiated: 09/21/2016 Target Resolution Date: 03/25/2017 Goal Status: Active Interventions: Assess patient nutrition upon admission and as needed per policy Goldie, TWallace ([id number] Notes: ` Orientation to the Wound Care Program Nursing Diagnoses: Knowledge deficit related to the wound healing center program Goals: Patient/caregiver will verbalize understanding of the WSt. [facility]Date Initiated: 09/21/2016 Target Resolution Date: 03/25/2017 Goal Status: Active Interventions: Provide education on orientation to the wound center Notes: ` Pain, Acute or Chronic Nursing Diagnoses: Pain, acute or chronic: actual or potential Potential alteration in comfort, pain Goals: Patient will verbalize adequate pain control and receive pain control interventions during procedures as needed Date Initiated: 09/21/2016 Target Resolution Date: 03/25/2017 Goal Status: Active Patient/caregiver will verbalize adequate pain control between visits Date Initiated: 09/21/2016 Target Resolution Date: 03/25/2017 Goal Status: Active Patient/caregiver will verbalize comfort level met Date Initiated: 09/21/2016 Target Resolution Date: 03/25/2017 Goal Status: Active Interventions: Assess comfort goal upon admission Complete pain assessment as per visit requirements Notes: ` Wound/Skin Impairment Nursing  Diagnoses: Tolle, Brylea L. ([id number]) Impaired tissue integrity Goals: Ulcer/skin breakdown will have a volume reduction of 30% by week 4 Date Initiated: 09/21/2016 Target Resolution Date: 03/25/2017 Goal Status: Active Ulcer/skin breakdown will have a volume reduction of 50% by week 8 Date Initiated: 09/21/2016 Target Resolution  Date: 03/25/2017 Goal Status: Active Ulcer/skin breakdown will have a volume reduction of 80% by week 12 Date Initiated: 09/21/2016 Target Resolution Date: 03/25/2017 Goal Status: Active Interventions: Assess ulceration(s) every visit Notes: Electronic Signature(s) Signed: 04/12/2017 4:28:23 PM By: Alric Quan Entered By: Alric Quan on 04/12/2017 08:15:53 Ozaki, [name] ([id number]) -------------------------------------------------------------------------------- Pain Assessment Details Patient Name: [name] Date of Service: 04/12/2017 8:00 AM Medical Record Patient Account Number: [id number] Number: Treating RN: Ahmed Prima September 14, 1961 (56 y.o. Other Clinician: Date of Birth/Sex: Female) Treating ROBSON, MICHAEL Primary Care Preston Weill: Lavera Guise Robertha Staples/Extender: G Referring Tameeka Luo: Sallee Lange in Treatment: 29 Active Problems Location of Pain Severity and Description of Pain Patient Has Paino No Site Locations With Dressing Change: No Pain Management and Medication Current Pain Management: Electronic Signature(s) Signed: 04/12/2017 4:28:23 PM By: Alric Quan Entered By: Alric Quan on 04/12/2017 08:05:48 Phagan, [name] ([id number]) -------------------------------------------------------------------------------- Patient/Caregiver Education Details Patient Name: [name] Date of Service: 04/12/2017 8:00 AM Medical Record Patient Account Number: [id number] Number: Treating RN: Ahmed Prima 1961/01/13 (56 y.o. Other Clinician: Date of Birth/Gender: Female) Treating ROBSON, MICHAEL Primary Care Physician: Lavera Guise Physician/Extender: G Referring Physician: Sallee Lange in Treatment: 54 Education Assessment Education Provided To: Patient Education Topics Provided Wound/Skin Impairment: Handouts: Other: change dressing as ordered Methods: Demonstration, Explain/Verbal Responses: State content  correctly Electronic Signature(s) Signed: 04/12/2017 4:28:23 PM By: Alric Quan Entered By: Alric Quan on 04/12/2017 08:20:04 [name], Ecko Carlean Jews ([id number]) -------------------------------------------------------------------------------- Wound Assessment Details Patient Name: [name] Date of Service: 04/12/2017 8:00 AM Medical Record Patient Account Number: [id number] Number: Treating RN: Ahmed Prima 1961/03/20 (56 y.o. Other Clinician: Date of Birth/Sex: Female) Treating ROBSON, MICHAEL Primary Care Feliciana Narayan: Lavera Guise Marnell Mcdaniel/Extender: G Referring Kent Braunschweig: Sallee Lange in Treatment: 29 Wound Status Wound Number: 3 Primary Pressure Ulcer Etiology: Wound Location: Right Gluteal fold Wound Status: Open Wounding Event: Pressure Injury Comorbid Asthma, Hypertension, Type II Date Acquired: 04/21/2016 History: Diabetes, Neuropathy Weeks Of Treatment: 29 Clustered Wound: No Photos Photo Uploaded By: Alric Quan on 04/12/2017 11:17:34 Wound Measurements Length: (cm) 1 Width: (cm) 2 Depth: (cm) 4.4 Area: (cm) 1.571 Volume: (cm) 6.912 % Reduction in Area: 48.8% % Reduction in Volume: 59.8% Epithelialization: None Tunneling: No Undermining: No Wound Description Classification: Category/Stage IV Foul Odor Aft Wound Margin: Distinct, outline attached Due to Produc Exudate Amount: Large Slough/Fibrin Exudate Type: Serous Exudate Color: amber er Cleansing: Yes t Use: No o Yes Wound Bed Granulation Amount: Medium (34-66%) Exposed Structure Granulation Quality: Red Fat Layer (Subcutaneous Tissue) Exposed: Yes Necrotic Amount: Medium (34-66%) Earhart, Jireh L. ([id number]) Necrotic Quality: Adherent Slough Periwound Skin Texture Texture Color No Abnormalities Noted: No No Abnormalities Noted: No Callus: No Atrophie Blanche: No Crepitus: No Cyanosis: No Excoriation: No Ecchymosis: No Induration: No Erythema: No Rash:  No Hemosiderin Staining: No Scarring: Yes Mottled: No Pallor: No Moisture Rubor: No No Abnormalities Noted: No Dry / Scaly: No Temperature / Pain Maceration: Yes Temperature: No Abnormality Tenderness on Palpation: Yes Wound Preparation Ulcer Cleansing: Rinsed/Irrigated with Saline Topical Anesthetic Applied: Other: lidocaine 4%, Treatment Notes Wound #3 (Right Gluteal fold) 1. Cleansed with: Clean wound with Normal Saline 2. Anesthetic Topical Lidocaine 4% cream to  wound bed prior to debridement 3. Peri-wound Care: Skin Prep 4. Dressing Applied: Prisma Ag 8. Negative Pressure Wound Therapy Wound Vac to wound continuously at 139m/hg pressure Number of foam/gauze pieces used in the dressing = Notes 1 PIECE BLACK FOAM TO BRIDGE Electronic Signature(s) Signed: 04/12/2017 4:28:23 PM By: PAlric QuanEntered By: PAlric Quanon 04/12/2017 08:15:36 Hertenstein, TGeorgia Dom([id number] -------------------------------------------------------------------------------- VSalineDetails Patient Name: WBenedetto GoadDate of Service: 04/12/2017 8:00 AM Medical Record Patient Account Number: [id number]: Treating RN: PAhmed Prima310/14/1962(56y.o. Other Clinician: Date of Birth/Sex: Female) Treating ROBSON, MICHAEL Primary Care Jahel Wavra: BLavera GuiseProvider/Extender: G Referring Eber Ferrufino: BSallee Langein Treatment: 29 Vital Signs Time Taken: 08:05 Temperature (F): 97.9 Height (in): 63 Pulse (bpm): 91 Weight (lbs): 257 Respiratory Rate (breaths/min): 18 Body Mass Index (BMI): 45.5 Blood Pressure (mmHg): 122/44 Reference Range: 80 - 120 mg / dl Electronic Signature(s) Signed: 04/12/2017 4:28:23 PM By: PAlric QuanEntered By: PAlric Quanon 04/12/2017 08:07:48

## 2017-04-25 ENCOUNTER — Ambulatory Visit: Payer: Managed Care, Other (non HMO) | Admitting: Pain Medicine

## 2017-04-25 ENCOUNTER — Encounter (INDEPENDENT_AMBULATORY_CARE_PROVIDER_SITE_OTHER): Payer: Self-pay

## 2017-04-25 ENCOUNTER — Other Ambulatory Visit
Admission: RE | Admit: 2017-04-25 | Discharge: 2017-04-25 | Disposition: A | Discharge status: Home / Self Care | Payer: Managed Care, Other (non HMO) | Source: Ambulatory Visit | Attending: Pain Medicine | Admitting: Pain Medicine

## 2017-04-25 ENCOUNTER — Encounter: Payer: Self-pay | Admitting: Pain Medicine

## 2017-04-25 ENCOUNTER — Ambulatory Visit: Payer: Managed Care, Other (non HMO) | Attending: Pain Medicine | Admitting: Pain Medicine

## 2017-04-25 ENCOUNTER — Other Ambulatory Visit: Payer: Self-pay

## 2017-04-25 VITALS — BP 106/54 | HR 73 | Temp 98.5°F | Resp 16 | Ht 63.0 in | Wt 275.0 lb

## 2017-04-25 DIAGNOSIS — M069 Rheumatoid arthritis, unspecified: Secondary | ICD-10-CM | POA: Insufficient documentation

## 2017-04-25 DIAGNOSIS — M79604 Pain in right leg: Secondary | ICD-10-CM | POA: Diagnosis not present

## 2017-04-25 DIAGNOSIS — Z79891 Long term (current) use of opiate analgesic: Secondary | ICD-10-CM

## 2017-04-25 DIAGNOSIS — M8660 Other chronic osteomyelitis, unspecified site: Secondary | ICD-10-CM

## 2017-04-25 DIAGNOSIS — F1190 Opioid use, unspecified, uncomplicated: Secondary | ICD-10-CM

## 2017-04-25 DIAGNOSIS — M79605 Pain in left leg: Secondary | ICD-10-CM

## 2017-04-25 DIAGNOSIS — R531 Weakness: Secondary | ICD-10-CM | POA: Insufficient documentation

## 2017-04-25 DIAGNOSIS — M79643 Pain in unspecified hand: Secondary | ICD-10-CM

## 2017-04-25 DIAGNOSIS — G710 Muscular dystrophy: Secondary | ICD-10-CM | POA: Insufficient documentation

## 2017-04-25 DIAGNOSIS — I110 Hypertensive heart disease with heart failure: Secondary | ICD-10-CM | POA: Insufficient documentation

## 2017-04-25 DIAGNOSIS — H409 Unspecified glaucoma: Secondary | ICD-10-CM | POA: Insufficient documentation

## 2017-04-25 DIAGNOSIS — Z833 Family history of diabetes mellitus: Secondary | ICD-10-CM | POA: Insufficient documentation

## 2017-04-25 DIAGNOSIS — M79671 Pain in right foot: Secondary | ICD-10-CM | POA: Insufficient documentation

## 2017-04-25 DIAGNOSIS — Z87440 Personal history of urinary (tract) infections: Secondary | ICD-10-CM | POA: Insufficient documentation

## 2017-04-25 DIAGNOSIS — Z823 Family history of stroke: Secondary | ICD-10-CM | POA: Insufficient documentation

## 2017-04-25 DIAGNOSIS — G894 Chronic pain syndrome: Secondary | ICD-10-CM | POA: Insufficient documentation

## 2017-04-25 DIAGNOSIS — E1142 Type 2 diabetes mellitus with diabetic polyneuropathy: Secondary | ICD-10-CM

## 2017-04-25 DIAGNOSIS — J45909 Unspecified asthma, uncomplicated: Secondary | ICD-10-CM | POA: Insufficient documentation

## 2017-04-25 DIAGNOSIS — R29898 Other symptoms and signs involving the musculoskeletal system: Secondary | ICD-10-CM

## 2017-04-25 DIAGNOSIS — Z794 Long term (current) use of insulin: Secondary | ICD-10-CM | POA: Insufficient documentation

## 2017-04-25 DIAGNOSIS — G8220 Paraplegia, unspecified: Secondary | ICD-10-CM | POA: Insufficient documentation

## 2017-04-25 DIAGNOSIS — Z8249 Family history of ischemic heart disease and other diseases of the circulatory system: Secondary | ICD-10-CM | POA: Insufficient documentation

## 2017-04-25 DIAGNOSIS — Z8673 Personal history of transient ischemic attack (TIA), and cerebral infarction without residual deficits: Secondary | ICD-10-CM

## 2017-04-25 DIAGNOSIS — M79672 Pain in left foot: Secondary | ICD-10-CM | POA: Insufficient documentation

## 2017-04-25 DIAGNOSIS — F329 Major depressive disorder, single episode, unspecified: Secondary | ICD-10-CM | POA: Insufficient documentation

## 2017-04-25 DIAGNOSIS — M79674 Pain in right toe(s): Secondary | ICD-10-CM

## 2017-04-25 DIAGNOSIS — G8929 Other chronic pain: Secondary | ICD-10-CM

## 2017-04-25 DIAGNOSIS — K219 Gastro-esophageal reflux disease without esophagitis: Secondary | ICD-10-CM | POA: Insufficient documentation

## 2017-04-25 DIAGNOSIS — I509 Heart failure, unspecified: Secondary | ICD-10-CM | POA: Insufficient documentation

## 2017-04-25 DIAGNOSIS — G5603 Carpal tunnel syndrome, bilateral upper limbs: Secondary | ICD-10-CM

## 2017-04-25 DIAGNOSIS — R600 Localized edema: Secondary | ICD-10-CM

## 2017-04-25 DIAGNOSIS — M25519 Pain in unspecified shoulder: Secondary | ICD-10-CM | POA: Insufficient documentation

## 2017-04-25 DIAGNOSIS — F419 Anxiety disorder, unspecified: Secondary | ICD-10-CM | POA: Insufficient documentation

## 2017-04-25 DIAGNOSIS — E785 Hyperlipidemia, unspecified: Secondary | ICD-10-CM | POA: Insufficient documentation

## 2017-04-25 DIAGNOSIS — M79675 Pain in left toe(s): Secondary | ICD-10-CM

## 2017-04-25 LAB — COMPREHENSIVE METABOLIC PANEL
ALBUMIN: 3.5 g/dL (ref 3.5–5.0)
ALK PHOS: 94 U/L (ref 38–126)
ALT: 11 U/L — ABNORMAL LOW (ref 14–54)
AST: 17 U/L (ref 15–41)
Anion gap: 9 (ref 5–15)
BILIRUBIN TOTAL: 0.4 mg/dL (ref 0.3–1.2)
BUN: 27 mg/dL — AB (ref 6–20)
CALCIUM: 9.5 mg/dL (ref 8.9–10.3)
CO2: 27 mmol/L (ref 22–32)
CREATININE: 1.39 mg/dL — AB (ref 0.44–1.00)
Chloride: 100 mmol/L — ABNORMAL LOW (ref 101–111)
GFR calc Af Amer: 48 mL/min — ABNORMAL LOW (ref >60)
GFR, EST NON AFRICAN AMERICAN: 42 mL/min — AB (ref >60)
GLUCOSE: 146 mg/dL — AB (ref 65–99)
POTASSIUM: 4.3 mmol/L (ref 3.5–5.1)
Sodium: 136 mmol/L (ref 135–145)
Total Protein: 8.1 g/dL (ref 6.5–8.1)

## 2017-04-25 LAB — SEDIMENTATION RATE: Sed Rate: 63 mm/hr — ABNORMAL HIGH (ref 0–30)

## 2017-04-25 LAB — CBC WITH DIFFERENTIAL/PLATELET
BASOS ABS: 0.1 10*3/uL (ref 0–0.1)
Basophils Relative: 1 %
EOS PCT: 8 %
Eosinophils Absolute: 0.7 10*3/uL (ref 0–0.7)
HEMATOCRIT: 35.2 % (ref 35.0–47.0)
Hemoglobin: 11.1 g/dL — ABNORMAL LOW (ref 12.0–16.0)
LYMPHS ABS: 1.4 10*3/uL (ref 1.0–3.6)
Lymphocytes Relative: 16 %
MCH: 25.2 pg — AB (ref 26.0–34.0)
MCHC: 31.7 g/dL — AB (ref 32.0–36.0)
MCV: 79.6 fL — AB (ref 80.0–100.0)
MONO ABS: 0.7 10*3/uL (ref 0.2–0.9)
MONOS PCT: 8 %
NEUTROS ABS: 5.5 10*3/uL (ref 1.4–6.5)
Neutrophils Relative %: 67 %
Platelets: 260 10*3/uL (ref 150–440)
RBC: 4.42 MIL/uL (ref 3.80–5.20)
RDW: 17.4 % — AB (ref 11.5–14.5)
WBC: 8.3 10*3/uL (ref 3.6–11.0)

## 2017-04-25 LAB — MAGNESIUM: Magnesium: 1.7 mg/dL (ref 1.7–2.4)

## 2017-04-25 LAB — VITAMIN B12: Vitamin B-12: 402 pg/mL (ref 180–914)

## 2017-04-25 LAB — C-REACTIVE PROTEIN

## 2017-04-25 NOTE — Progress Notes (Signed)
Safety precautions to be maintained throughout the outpatient stay will include: orient to surroundings, keep bed in low position, maintain call bell within reach at all times, provide assistance with transfer out of bed and ambulation.  Currently taking antibiotics for decubitus

## 2017-04-25 NOTE — Progress Notes (Signed)
Patient's Name: [name]  MRN: [id number]  Referring Provider: Lynnell Jude, MD  DOB: Sep 23, 1961  PCP: Lynnell Jude, MD  DOS: 04/25/2017  Note by: Kathlen Brunswick. Dossie Arbour, MD  Service setting: Ambulatory outpatient  Specialty: Interventional Pain Management  Location: [facility] (AMB) Pain Management Facility    Patient type: New Patient   Primary Reason(s) for Visit: Initial Patient Evaluation CC: Foot Pain (bilateral)  HPI  [name] is a 56 y.o. year old, female patient, who comes today for an initial evaluation. 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[place]); Closed fracture of humerus, surgical neck; Kidney lump; Diabetes mellitus ([place]); Bilateral edema of lower extremity; Bilateral leg weakness; Chronic osteomyelitis ([place]); Closed 3-part fracture of surgical neck of right humerus with delayed healing; Infected decubitus ulcer, unstageable ([place]); Kidney mass; Other specified diabetes mellitus with ketoacidosis without coma ([place]); Chronic feet pain (Location of Primary Source of Pain) (Bilateral) (R>L); Diabetic peripheral neuropathy (HCC) (Location of Primary Source of Pain) (Bilateral) (R>L); Chronic lower extremity pain (Location of Secondary source of pain) (Bilateral) (R>L); Chronic pain syndrome; Long term (current) use of opiate analgesic; Long term prescription opiate use; Opiate use; Chronic hand pain (Location of Tertiary source of pain) (Bilateral) (L>R); Carpal tunnel syndrome (Bilateral) (L>R); and History of stroke on her problem list.. Her primarily concern today is the Foot Pain (bilateral)  Pain Assessment: Self-Reported Pain Score: 4 /10 Clinically the patient looks like a 55/10 Reported level is inconsistent with clinical observations. Information on the proper use of the pain scale provided to the patient today Pain Type: Neuropathic pain Pain Location: Foot Pain Orientation: Right, Left Pain Descriptors /  Indicators: Burning Pain Frequency: Intermittent  Onset and Duration: Date of onset: 04/2016 Cause of pain: sickness Severity: NAS-11 at its worse: 10/10, NAS-11 at its best: 6/10, NAS-11 now: 6/10 and NAS-11 on the average: 7/10 Timing: Not influenced by the time of the day Aggravating Factors: Prolonged sitting and Prolonged standing Alleviating Factors: Lying down, Medications and physical therapy Associated Problems: Numbness, Swelling, Tingling and Pain that wakes patient up Quality of Pain: Burning, Constant, Deep, Hot, Sharp, Shooting, Throbbing, Tingling and Uncomfortable Previous Examinations or Tests: CT scan and MRI scan Previous Treatments: Narcotic medications, Physical Therapy and TENS  The patient comes into the clinics today for the first time for a chronic pain management evaluation. According to the patient and the primary area of pain is that of her feet with the right side being worse than the left. She denies any surgeries, nerve blocks, or recent x-rays. However she does admit to having had some physical therapy which in fact she still going to at this time. She indicates currently having physical therapy twice a week. The next area of pain is described to be that of the lower extremities with the right being worse than the left. In the case of the left lower extremity she denies any prior surgeries, nerve blocks, or x-rays. As described above she still going to physical therapy at this time. The pain is in the area of her knee, calf, and feet. In the case of the right lower extremity the patient indicates having had a right reconstructive anterior cruciate ligament surgery between 1999 and the year 2000 at [facility] by Dr. Sabra Heck. She also indicates having had some nerve blocks or joint injections by Dr. Sabra Heck. She also indicates having had an MRI ordered by Dr. Manuella Ghazi. The patient  has a history significant for sepsis and she indicates having lost her  short-term and long-term memory he appears to have had a stroke but does not really remember when. The next area of pain is that of her hands with the left being worst on the right. Physical exam today demonstrates the patient to be negative for carpal tolerable syndrome. She is right-handed. She describes having bilateral thumb trigger fingers. In terms of this sepsis, this is thought to have started with a UTI and having been influenced by her insulin-dependent diabetes mellitus. She indicates having been unable to walk since May 2017. She states seems to think that this is related to her sepsis but at the same time, he may be secondary to her stroke. She indicates that the pain started after April 2017.  Today I took the time to provide the patient with information regarding my pain practice. The patient was informed that my practice is divided into two sections: an interventional pain management section, as well as a completely separate and distinct medication management section. I explained that I have procedure days for my interventional therapies, and evaluation days for follow-ups and medication management. Because of the amount of documentation required during both, they are kept separated. This means that there is the possibility that she may be scheduled for a procedure on one day, and medication management the next. I have also informed her that because of staffing and facility limitations, I no longer take patients for medication management only. To illustrate the reasons for this, I gave the patient the example of surgeons, and how inappropriate it would be to refer a patient to his/her care, just to write for the post-surgical antibiotics on a surgery done by a different surgeon.   Because interventional pain management is my board-certified specialty, the patient was informed that joining my practice means that they are open to any and all interventional therapies. I made it clear that this does  not mean that they will be forced to have any procedures done. What this means is that I believe interventional therapies to be essential part of the diagnosis and proper management of chronic pain conditions. Therefore, patients not interested in these interventional alternatives will be better served under the care of a different practitioner.  The patient was also made aware of my Comprehensive Pain Management Safety Guidelines where by joining my practice, they limit all of their nerve blocks and joint injections to those done by our practice, for as long as we are retained to manage their care.   Historic Controlled Substance Pharmacotherapy Review  PMP and historical list of controlled substances: Oxycodone/APAP 10/325; oxycodone/APAP 5/325; Tussionex Pennkinetic suspension; Ambien 10 mg; alprazolam 0.5 mg; hydrocodone/APAP 5/325; hydrocodone/APAP 7.5/750. Highest analgesic regimen found: Oxycodone/APAP 5/325 2 tablets every 6 hours (40 mg/day of oxycodone) (61.36 MME/Day) Most recent analgesic: Oxycodone/APAP 10/325 one tablet every 8 hours (30 mg/day of oxycodone) (45 MME/Day) (last filled on 04/17/2017) Highest recorded MME/day: 61.36 mg/day MME/day: 45 mg/day Medications: The patient did not bring the medication(s) to the appointment, as requested in our "New Patient Package" Pharmacodynamics: Desired effects: Analgesia: The patient reports >50% benefit. Reported improvement in function: The patient reports medication allows her to accomplish basic ADLs. Clinically meaningful improvement in function (CMIF): Sustained CMIF goals met Perceived effectiveness: Described as relatively effective, allowing for increase in activities of daily living (ADL) Undesirable effects: Side-effects or Adverse reactions: None reported Historical Monitoring: The patient  has no drug history on file.. List of  all UDS Test(s): No results found for: MDMA, COCAINSCRNUR, PCPSCRNUR, PCPQUANT, CANNABQUANT,  THCU, ETH List of all Serum Drug Screening Test(s):  No results found for: AMPHSCRSER, BARBSCRSER, BENZOSCRSER, COCAINSCRSER, PCPSCRSER, PCPQUANT, THCSCRSER, CANNABQUANT, OPIATESCRSER, OXYSCRSER, PROPOXSCRSER Historical Background Evaluation: Portal PDMP: Six (6) year initial data search conducted.             [place] Department of public safety, offender search: Editor, commissioning Information) Non-contributory Risk Assessment Profile: Aberrant behavior: None observed or detected today Risk factors for fatal opioid overdose: None identified today Fatal overdose hazard ratio (HR): Calculation deferred Non-fatal overdose hazard ratio (HR): Calculation deferred Risk of opioid abuse or dependence: 0.7-3.0% with doses ? 36 MME/day and 6.1-26% with doses ? 120 MME/day. Substance use disorder (SUD) risk level: Pending results of Medical Psychology Evaluation for SUD Opioid risk tool (ORT) (Total Score): 0  ORT Scoring interpretation table:  Score <3 = Low Risk for SUD  Score between 4-7 = Moderate Risk for SUD  Score >8 = High Risk for Opioid Abuse   PHQ-2 Depression Scale:  Total score: 0  PHQ-2 Scoring interpretation table: (Score and probability of major depressive disorder)  Score 0 = No depression  Score 1 = 15.4% Probability  Score 2 = 21.1% Probability  Score 3 = 38.4% Probability  Score 4 = 45.5% Probability  Score 5 = 56.4% Probability  Score 6 = 78.6% Probability   PHQ-9 Depression Scale:  Total score: 0  PHQ-9 Scoring interpretation table:  Score 0-4 = No depression  Score 5-9 = Mild depression  Score 10-14 = Moderate depression  Score 15-19 = Moderately severe depression  Score 20-27 = Severe depression (2.4 times higher risk of SUD and 2.89 times higher risk of overuse)   Pharmacologic Plan: Pending ordered tests and/or consults  Meds  The patient has a current medication list which includes the following prescription(s): amoxicillin-clavulanate, vitamin c, atorvastatin, calcium  carbonate-vitamin d3, cholecalciferol, duloxetine, furosemide, gabapentin, insulin aspart, insulin detemir, latanoprost, metoprolol tartrate, omeprazole, oxybutynin, oxycodone-acetaminophen, and trazodone.  Current Outpatient Prescriptions on File Prior to Visit  Medication Sig  . amoxicillin-clavulanate (AUGMENTIN) 875-125 MG tablet Take 1 tablet by mouth 2 (two) times daily.  . Ascorbic Acid (VITAMIN C) 1000 MG tablet Take 1,000 mg by mouth daily.  Marland Kitchen atorvastatin (LIPITOR) 20 MG tablet Take 20 mg by mouth at bedtime.  . cholecalciferol (VITAMIN D) 1000 units tablet Take 5,000 Units by mouth daily.  . DULoxetine (CYMBALTA) 20 MG capsule Take 60 mg by mouth daily.   . furosemide (LASIX) 20 MG tablet Take 1 tablet (20 mg total) by mouth daily.  . insulin aspart (NOVOLOG) 100 UNIT/ML injection Inject 6 Units into the skin 3 (three) times daily with meals. (Patient taking differently: 8 Units 3 (three) times daily with meals. )  . insulin detemir (LEVEMIR) 100 UNIT/ML injection Inject 60 Units into the skin every morning.  . latanoprost (XALATAN) 0.005 % ophthalmic solution Place 1 drop into both eyes at bedtime.  . metoprolol tartrate (LOPRESSOR) 25 MG tablet Take 1 tablet (25 mg total) by mouth 2 (two) times daily.  Marland Kitchen omeprazole (PRILOSEC) 20 MG capsule Take 20 mg by mouth daily.  Marland Kitchen oxybutynin (DITROPAN-XL) 10 MG 24 hr tablet Take 10 mg by mouth at bedtime.  Marland Kitchen oxyCODONE-acetaminophen (PERCOCET) 10-325 MG tablet Take 1 tablet by mouth every 6 (six) hours as needed for pain.  . traZODone (DESYREL) 50 MG tablet Take 1 tablet (50 mg total) by mouth at bedtime. (Patient taking differently: Take  100 mg by mouth at bedtime. )   No current facility-administered medications on file prior to visit.    Imaging Review  Cervical Imaging: Cervical MR w/wo contrast:  Results for orders placed during the hospital encounter of 09/09/16  MR Cervical Spine W Wo Contrast   Narrative CLINICAL DATA:  56 year old  female with bilateral lower extremity weakness since April. Was hospitalized for sepsis earlier this year. Tingling in the feet. Initial encounter.  EXAM: MRI CERVICAL SPINE WITHOUT AND WITH CONTRAST  TECHNIQUE: Multiplanar and multiecho pulse sequences of the cervical spine, to include the craniocervical junction and cervicothoracic junction, were obtained according to standard protocol without and with intravenous contrast.  CONTRAST:  20 mL MultiHance in conjunction with contrast enhanced imaging of the brain reported separately.  COMPARISON:  Brain MRI from today reported separately.  FINDINGS: Straightening of cervical lordosis. No marrow edema or evidence of acute osseous abnormality.  Cervicomedullary junction is within normal limits. Spinal cord signal is within normal limits at all visualized levels. No abnormal intradural enhancement.  Negative visualized neck soft tissues. Partially retropharyngeal course of the right carotid. Major vascular flow voids in the neck are preserved.  No degenerative cervical spinal stenosis. Intermittent mild disc bulging in the cervical spine. No significant cervical neural foraminal stenosis results. No visualized upper thoracic spinal or foraminal stenosis.  IMPRESSION: Negative MRI appearance of the cervical spine. Normal cervical and visualized upper thoracic spinal cord.   Electronically Signed   By: Genevie Ann M.D.   On: 09/09/2016 12:48    Thoracic Imaging: Thoracic MR wo contrast:  Results for orders placed during the hospital encounter of 04/23/16  MR Thoracic Spine Wo Contrast   Narrative CLINICAL DATA:  Fall at home and unable to get up. Lower extremity numbness and weakness since the fall. The patient was found to be an diabetic ketoacidosis and septic.  EXAM: MRI THORACIC SPINE WITHOUT CONTRAST  TECHNIQUE: Multiplanar, multisequence MR imaging of the thoracic spine was performed. No intravenous contrast was  administered.  COMPARISON:  MRI lumbar spine from the same day.  FINDINGS: The study is mildly degraded by patient motion. Normal signal is present in the thoracic spinal cord to the lowest imaged level, L1, within normal limits. Mild endplate marrow changes are noted anteriorly at T9-10 and T11-12. More subtle findings are present anteriorly at T6-7, T7-8, and T8-9. Vertebral body heights and alignment are maintained.  No central disc protrusion or foraminal stenosis is evident within the thoracic spine.  IMPRESSION: 1. Mild anterior endplate degenerative changes T6-7 through T11-12. 2. Otherwise normal marrow signal and vertebral body heights. 3. Left renal cyst partially imaged. No significant thoracic adenopathy.   Electronically Signed   By: San Morelle M.D.   On: 04/25/2016 15:11    Lumbosacral Imaging: Lumbar MR wo contrast:  Results for orders placed during the hospital encounter of 04/23/16  MR Lumbar Spine Wo Contrast   Narrative CLINICAL DATA:  Bilateral lower extremity numbness and weakness after fall at home. The patient was found to be an diabetic ketoacidosis and aseptic.  EXAM: MRI LUMBAR SPINE WITHOUT CONTRAST  TECHNIQUE: Multiplanar, multisequence MR imaging of the lumbar spine was performed. No intravenous contrast was administered.  COMPARISON:  MRI of the thoracic spine from the same day.  FINDINGS: Normal signal is present in the conus medullaris which terminates at L1. Marrow signal, vertebral body heights, alignment are normal.  A 3 cm cystic lesion is present medially within the left kidney. Enlarged left  para-aortic lymph node measures up to 15 mm in short axis at the level of the left renal hilum. Other smaller rounded left periaortic lymph nodes are present as well. The other nodes are less than 1 cm in short axis.  L1-2:  Negative.  L2-3:  Negative.  L3-4: Mild facet hypertrophy is present bilaterally without significant  stenosis.  L4-5: Asymmetric right-sided ligamentum flavum thickening inch roots into the central canal posteriorly on the right. There is no significant stenosis. Slight disc bulging is noted.  L5-S1:  Negative.  A transitional S1 segment is present. A small disc is present at S1-2 without significant protrusion or stenosis.  IMPRESSION: 1. Enlarged left para-aortic lymph node at the renal hilum. Cystic lesions are noted in the kidneys. The kidneys are incompletely imaged. Recommend CT of the abdomen and pelvis with contrast for further evaluation to exclude malignancy. 2. Asymmetric right-sided facet hypertrophy and ligamentum flavum thickening without focal stenosis. 3. Transitional anatomy at S1.   Electronically Signed   By: San Morelle M.D.   On: 04/25/2016 15:07    Knee Imaging: Knee-L DG 4 views:  Results for orders placed during the hospital encounter of 10/03/16  DG Knee Complete 4 Views Left   Narrative CLINICAL DATA:  Pt here today because of a wound on the posterior left knee that has been there since August. Pt stated that it was actively draining today. Hx of diabetes, arthritis, hypertension.  EXAM: LEFT KNEE - COMPLETE 4+ VIEW  COMPARISON:  None.  FINDINGS: No fracture. No bone lesion. No areas of bone resorption are seen to suggest osteomyelitis.  Joints are normally spaced and aligned.  No arthropathic change.  No joint effusion.  Dressings are seen along the posterior lateral aspect of the knee. No soft tissue air.  IMPRESSION: 1. No fracture, bone lesion or evidence of osteomyelitis. No knee joint abnormality.   Electronically Signed   By: Lajean Manes M.D.   On: 10/03/2016 11:26    Note: Available results from prior imaging studies were reviewed.        ROS  Cardiovascular History: Hypertension and Congestive heart failure Pulmonary or Respiratory History: Asthma, Shortness of breath, Smoker, Snoring  and  Bronchitis Neurological History: Stroke (Residual deficits or weakness: Positive for weakness of the lower extremity with claims of being unable to walk.), Peripheral Neuropathy and Scoliosis Review of Past Neurological Studies:  Results for orders placed or performed during the hospital encounter of 09/09/16  MR Brain W Wo Contrast   Narrative   CLINICAL DATA:  56 year old female with bilateral lower extremity weakness since April. Was hospitalized for sepsis earlier this year. Tingling in the feet. Initial encounter.  EXAM: MRI HEAD WITHOUT AND WITH CONTRAST  TECHNIQUE: Multiplanar, multiecho pulse sequences of the brain and surrounding structures were obtained without and with intravenous contrast.  CONTRAST:  72m MULTIHANCE GADOBENATE DIMEGLUMINE 529 MG/ML IV SOLN  COMPARISON:  Cervical spine MRI from today reported separately.  FINDINGS: Brain: Chronic left PCA infarct along the left parieto-occipital sulcus with encephalomalacia and laminar necrosis. Intrinsic T1 hyperintensity associated with laminar necrosis such that no post contrast enhancement of the area is suspected. No other cortical encephalomalacia identified. Trace hemosiderin in the chronic infarct, no other chronic cerebral blood products identified.  No restricted diffusion to suggest acute infarction. No midline shift, mass effect, evidence of mass lesion, ventriculomegaly, extra-axial collection or acute intracranial hemorrhage. Cervicomedullary junction and pituitary are within normal limits.  Aside from the above signal changes there  are scattered small foci of cerebral white matter T2 and FLAIR hyperintensity. There is some posterior temporal lobe involvement on the left. Overall the configuration is nonspecific. No corpus callosum involvement. Deep gray matter nuclei, deep gray matter nuclei and cerebellum are normal. There is mild patchy T2 and FLAIR hyperintensity in the pons. No abnormal  enhancement identified.  Vascular: Major intracranial vascular flow voids are preserved and appear normal.  Skull and upper cervical spine: Cervical spine findings reported separately today. Normal bone marrow signal at the skullbase.  Sinuses/Orbits: Orbits soft tissues appear normal. Trace paranasal sinus mucosal thickening. Mastoids are clear.  Other: Visible internal auditory structures appear normal. Negative scalp soft tissues.  IMPRESSION: 1. Chronic left PCA infarct. 2. Scattered mild for age nonspecific cerebral white matter and pontine signal changes, not typical for demyelinating disease. 3.  Initial encounter.   Electronically Signed   By: Genevie Ann M.D.   On: 09/09/2016 12:46    Psychological-Psychiatric History: Depression Gastrointestinal History: Reflux or heatburn Genitourinary History: Recurrent Urinary Tract infections Hematological History: Negative for anticoagulant therapy, anemia, bruising or bleeding easily, hemophilia, sickle cell disease or trait, thrombocytopenia or coagulupathies Endocrine History: Insulin-dependent diabetes mellitus Rheumatologic History: Rheumatoid arthritis Musculoskeletal History: Muscular dystrophy Work History: Disabled  Allergies  Ms. Creger is allergic to biaxin [clarithromycin].  Laboratory Chemistry  Inflammation Markers Lab Results  Component Value Date   CRP <0.8 04/25/2017   ESRSEDRATE 63 (H) 04/25/2017   (CRP: Acute Phase) (ESR: Chronic Phase) Renal Function Markers Lab Results  Component Value Date   BUN 27 (H) 04/25/2017   CREATININE 1.39 (H) 04/25/2017   GFRAA 48 (L) 04/25/2017   GFRNONAA 42 (L) 04/25/2017   Hepatic Function Markers Lab Results  Component Value Date   AST 17 04/25/2017   ALT 11 (L) 04/25/2017   ALBUMIN 3.5 04/25/2017   ALKPHOS 94 04/25/2017   Electrolytes Lab Results  Component Value Date   NA 136 04/25/2017   K 4.3 04/25/2017   CL 100 (L) 04/25/2017   CALCIUM 9.5 04/25/2017    MG 1.7 04/25/2017   Neuropathy Markers Lab Results  Component Value Date   VITAMINB12 402 04/25/2017   Bone Pathology Markers Lab Results  Component Value Date   ALKPHOS 94 04/25/2017   VD25OH 19.7 (L) 04/25/2016   CALCIUM 9.5 04/25/2017   Coagulation Parameters Lab Results  Component Value Date   PLT 260 04/25/2017   Cardiovascular Markers Lab Results  Component Value Date   HGB 11.1 (L) 04/25/2017   HCT 35.2 04/25/2017   Note: Lab results reviewed.  PFSH  Drug: Ms. Nowling  has no drug history on file. Alcohol:  reports that she does not drink alcohol. Tobacco:  reports that she has been smoking.  She has never used smokeless tobacco. Medical:  has a past medical history of Acute pain of right shoulder (05/19/2016); Acute PN (pyelonephritis) (05/18/2016); Acute pyelonephritis (05/18/2016); Anxiety; Arthritis; Asthma; Diabetes mellitus without complication ([place]); GERD (gastroesophageal reflux disease); Glaucoma; Hyperlipemia; Hyperlipemia; and Hypertension. Family: family history includes Diabetes in her mother; Heart disease in her mother; Stroke in her mother.  Past Surgical History:  Procedure Laterality Date  . ANTERIOR CRUCIATE LIGAMENT REPAIR     Active Ambulatory Problems    Diagnosis Date Noted  . History of DKA (diabetic ketoacidosis) ([place]) 04/23/2016  . Pressure ulcer 04/24/2016  . Pain in shoulder 05/19/2016  . Edema leg 05/18/2016  . 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 Closed fracture of humerus, surgical neck 05/31/2016  .  Kidney lump 05/18/2016  . Diabetes mellitus ([place]) 04/23/2016  . Bilateral edema of lower extremity 05/18/2016  . Bilateral leg weakness 05/18/2016  . Chronic osteomyelitis ([place]) 11/29/2016  . Closed 3-part fracture of surgical neck of right humerus with delayed healing 05/31/2016  . Infected decubitus ulcer, unstageable ([place]) 10/26/2016  . Kidney mass 05/18/2016  . Other specified diabetes mellitus with ketoacidosis without coma ([place])  04/23/2016  . Chronic feet pain (Location of Primary Source of Pain) (Bilateral) (R>L) 04/25/2017  . Diabetic peripheral neuropathy (HCC) (Location of Primary Source of Pain) (Bilateral) (R>L) 04/25/2017  . Chronic lower extremity pain (Location of Secondary source of pain) (Bilateral) (R>L) 04/25/2017  . Chronic pain syndrome 04/25/2017  . Long term (current) use of opiate analgesic 04/25/2017  . Long term prescription opiate use 04/25/2017  . Opiate use 04/25/2017  . Chronic hand pain (Location of Tertiary source of pain) (Bilateral) (L>R) 04/25/2017  . Carpal tunnel syndrome (Bilateral) (L>R) 04/25/2017  . History of stroke 04/25/2017   Resolved Ambulatory Problems    Diagnosis Date Noted  . Acute PN (pyelonephritis) 05/18/2016  . Acute pain of right shoulder 05/19/2016  . Acute pyelonephritis 05/18/2016   Past Medical History:  Diagnosis Date  . Acute pain of right shoulder 05/19/2016  . Acute PN (pyelonephritis) 05/18/2016  . Acute pyelonephritis 05/18/2016  . Anxiety   . Arthritis   . Asthma   . Diabetes mellitus without complication ([place])   . GERD (gastroesophageal reflux disease)   . Glaucoma   . Hyperlipemia   . Hyperlipemia   . Hypertension    Constitutional Exam  General appearance: Well nourished, well developed, and well hydrated. In no apparent acute distress Vitals:   04/25/17 0811  BP: (!) 106/54  Pulse: 73  Resp: 16  Temp: 98.5 F (36.9 C)  TempSrc: Oral  SpO2: 93%  Weight: 275 lb (124.7 kg)  Height: 5' 3" (1.6 m)   BMI Assessment: Estimated body mass index is 48.71 kg/m as calculated from the following:   Height as of this encounter: 5' 3" (1.6 m).   Weight as of this encounter: 275 lb (124.7 kg).  BMI interpretation table: BMI level Category Range association with higher incidence of chronic pain  <18 kg/m2 Underweight   18.5-24.9 kg/m2 Ideal body weight   25-29.9 kg/m2 Overweight Increased incidence by 20%  30-34.9 kg/m2 Obese (Class I)  Increased incidence by 68%  35-39.9 kg/m2 Severe obesity (Class II) Increased incidence by 136%  >40 kg/m2 Extreme obesity (Class III) Increased incidence by 254%   BMI Readings from Last 4 Encounters:  04/25/17 48.71 kg/m  06/27/16 46.77 kg/m  04/30/16 49.26 kg/m   Wt Readings from Last 4 Encounters:  04/25/17 275 lb (124.7 kg)  06/27/16 264 lb (119.7 kg)  04/30/16 296 lb (134.3 kg)  Psych/Mental status: Alert, oriented x 3 (person, place, & time)       Eyes: PERLA Respiratory: No evidence of acute respiratory distress  Cervical Spine Exam  Inspection: No masses, redness, or swelling Alignment: Symmetrical Functional ROM: Unrestricted ROM      Stability: No instability detected Muscle strength & Tone: Functionally intact Sensory: Unimpaired Palpation: No palpable anomalies              Upper Extremity (UE) Exam    Side: Right upper extremity  Side: Left upper extremity  Inspection: No masses, redness, swelling, or asymmetry. No contractures  Inspection: No masses, redness, swelling, or asymmetry. No contractures  Functional ROM: Unrestricted ROM  Functional ROM: Unrestricted ROM          Muscle strength & Tone: Functionally intact  Muscle strength & Tone: Functionally intact  Sensory: Unimpaired  Sensory: Unimpaired  Palpation: No palpable anomalies              Palpation: No palpable anomalies              Specialized Test(s): Deferred         Specialized Test(s): Deferred          Thoracic Spine Exam  Inspection: No masses, redness, or swelling Alignment: Symmetrical Functional ROM: Unrestricted ROM Stability: No instability detected Sensory: Unimpaired Muscle strength & Tone: No palpable anomalies  Lumbar Spine Exam  Inspection: No masses, redness, or swelling Alignment: Symmetrical Functional ROM: Unrestricted ROM      Stability: No instability detected Muscle strength & Tone: Functionally intact Sensory: Unimpaired Palpation: No palpable anomalies        Provocative Tests: Lumbar Hyperextension and rotation test: evaluation deferred today       Patrick's Maneuver: evaluation deferred today                    Gait & Posture Assessment  Ambulation: Patient came in today in a wheel chair Gait: Limited. Using assistive device to ambulate Posture: WNL   Lower Extremity Exam    Side: Right lower extremity  Side: Left lower extremity  Inspection: No masses, redness, swelling, or asymmetry. No contractures  Inspection: No masses, redness, swelling, or asymmetry. No contractures  Functional ROM: Unrestricted ROM          Functional ROM: Unrestricted ROM          Muscle strength & Tone: Functionally intact. The patient was observed to be able to move her legs but she claims being unable to stand up and walk.   Muscle strength & Tone: Functionally intact. The patient was observed to be able to move her legs but she claims being unable to stand up and walk.   Sensory: Unimpaired  Sensory: Unimpaired  Palpation: No palpable anomalies  Palpation: No palpable anomalies   Assessment  Primary Diagnosis & Pertinent Problem List: The primary encounter diagnosis was Chronic feet pain (Location of Primary Source of Pain) (Bilateral) (R>L). Diagnoses of Diabetic peripheral neuropathy (HCC) (Location of Primary Source of Pain) (Bilateral) (R>L), Chronic lower extremity pain (Location of Secondary source of pain) (Bilateral) (R>L), Chronic hand pain, unspecified laterality, Carpal tunnel syndrome (Bilateral) (L>R), History of stroke, Chronic osteomyelitis (HCC), Bilateral leg weakness, Bilateral edema of lower extremity, Chronic pain syndrome, Long term (current) use of opiate analgesic, Long term prescription opiate use, and Opiate use were also pertinent to this visit.  Visit Diagnosis: 1. Chronic feet pain (Location of Primary Source of Pain) (Bilateral) (R>L)   2. Diabetic peripheral neuropathy (HCC) (Location of Primary Source of Pain) (Bilateral) (R>L)    3. Chronic lower extremity pain (Location of Secondary source of pain) (Bilateral) (R>L)   4. Chronic hand pain, unspecified laterality   5. Carpal tunnel syndrome (Bilateral) (L>R)   6. History of stroke   7. Chronic osteomyelitis ([place])   8. Bilateral leg weakness   9. Bilateral edema of lower extremity   10. Chronic pain syndrome   11. Long term (current) use of opiate analgesic   12. Long term prescription opiate use   13. Opiate use    Plan of Care  Initial treatment plan:  Please be advised that as per protocol, today's  visit has been an evaluation only. We have not taken over the patient's controlled substance management.  Problem-specific plan: No problem-specific Assessment & Plan notes found for this encounter.  Ordered Lab-work, Procedure(s), Referral(s), & Consult(s): Orders Placed This Encounter  Procedures  . NM Bone Scan Whole Body  . Compliance Drug Analysis, Ur  . Comprehensive metabolic panel  . C-reactive protein  . Magnesium  . Sedimentation rate  . Vitamin B12  . 25-Hydroxyvitamin D Lcms D2+D3  . CBC with Differential/Platelet   Pharmacotherapy: Medications ordered:  No orders of the defined types were placed in this encounter.  Medications administered during this visit: Ms. Donate had no medications administered during this visit.   Pharmacotherapy under consideration:  Opioid Analgesics: The patient was informed that there is no guarantee that she would be a candidate for opioid analgesics. The decision will be made following CDC guidelines. This decision will be based on the results of diagnostic studies, as well as Ms. Thilges's risk profile.  Membrane stabilizer: To be determined at a later time Muscle relaxant: To be determined at a later time NSAID: To be determined at a later time Other analgesic(s): To be determined at a later time   Interventional therapies under consideration: Ms. Sonnen was informed that there is no guarantee that she would  be a candidate for interventional therapies. The decision will be based on the results of diagnostic studies, as well as Ms. Rozo's risk profile.  Possible procedure(s): Diagnostic bilateral lumbar sympathetic blocks  Possible bilateral lumbar sympathetic RFA    Provider-requested follow-up: Return for 2nd Visit, after [facility] eval.  Future Appointments Date Time Provider [place]  05/10/2017 8:00 AM Ricard Dillon, MD [facility] None  05/24/2017 8:00 AM Ricard Dillon, MD [facility] None    Primary Care Physician: Lynnell Jude, MD Location: [facility] Note by: Kathlen Brunswick. Dossie Arbour, M.D, DABA, DABAPM, DABPM, DABIPP, FIPP Date: 04/25/2017; Time: 10:19 AM  Patient instructions provided during this appointment: Patient Instructions   Pain Score  Introduction: The pain score used by this practice is the Verbal Numerical Rating Scale (VNRS-11). This is an 11-point scale. It is for adults and children 10 years or older. There are significant differences in how the pain score is reported, used, and applied. Forget everything you learned in the past and learn this scoring system.  General Information: The scale should reflect your current level of pain. Unless you are specifically asked for the level of your worst pain, or your average pain. If you are asked for one of these two, then it should be understood that it is over the past 24 hours.  Basic Activities of Daily Living (ADL): Personal hygiene, dressing, eating, transferring, and using restroom.  Instructions: Most patients tend to report their level of pain as a combination of two factors, their physical pain and their psychosocial pain. This last one is also known as "suffering" and it is reflection of how physical pain affects you socially and psychologically. From now on, report them separately. From this point on, when asked to report your pain level, report only your physical pain. Use the  following table for reference.  Pain Clinic Pain Levels (0-5/10)  Pain Level Score Description  No Pain 0   Mild pain 1 Nagging, annoying, but does not interfere with basic activities of daily living (ADL). Patients are able to eat, bathe, get dressed, toileting (being able to get on and off the toilet and perform personal hygiene functions),  transfer (move in and out of bed or a chair without assistance), and maintain continence (able to control bladder and bowel functions). Blood pressure and heart rate are unaffected. A normal heart rate for a healthy adult ranges from 60 to 100 bpm (beats per minute).   Mild to moderate pain 2 Noticeable and distracting. Impossible to hide from other people. More frequent flare-ups. Still possible to adapt and function close to normal. It can be very annoying and may have occasional stronger flare-ups. With discipline, patients may get used to it and adapt.   Moderate pain 3 Interferes significantly with activities of daily living (ADL). It becomes difficult to feed, bathe, get dressed, get on and off the toilet or to perform personal hygiene functions. Difficult to get in and out of bed or a chair without assistance. Very distracting. With effort, it can be ignored when deeply involved in activities.   Moderately severe pain 4 Impossible to ignore for more than a few minutes. With effort, patients may still be able to manage work or participate in some social activities. Very difficult to concentrate. Signs of autonomic nervous system discharge are evident: dilated pupils (mydriasis); mild sweating (diaphoresis); sleep interference. Heart rate becomes elevated (>115 bpm). Diastolic blood pressure (lower number) rises above 100 mmHg. Patients find relief in laying down and not moving.   Severe pain 5 Intense and extremely unpleasant. Associated with frowning face and frequent crying. Pain overwhelms the senses.  Ability to do any activity or maintain social  relationships becomes significantly limited. Conversation becomes difficult. Pacing back and forth is common, as getting into a comfortable position is nearly impossible. Pain wakes you up from deep sleep. Physical signs will be obvious: pupillary dilation; increased sweating; goosebumps; brisk reflexes; cold, clammy hands and feet; nausea, vomiting or dry heaves; loss of appetite; significant sleep disturbance with inability to fall asleep or to remain asleep. When persistent, significant weight loss is observed due to the complete loss of appetite and sleep deprivation.  Blood pressure and heart rate becomes significantly elevated. Caution: If elevated blood pressure triggers a pounding headache associated with blurred vision, then the patient should immediately seek attention at an urgent or emergency care unit, as these may be signs of an impending stroke.    Emergency Department Pain Levels (6-10/10)  Emergency Room Pain 6 Severely limiting. Requires emergency care and should not be seen or managed at an outpatient pain management facility. Communication becomes difficult and requires great effort. Assistance to reach the emergency department may be required. Facial flushing and profuse sweating along with potentially dangerous increases in heart rate and blood pressure will be evident.   Distressing pain 7 Self-care is very difficult. Assistance is required to transport, or use restroom. Assistance to reach the emergency department will be required. Tasks requiring coordination, such as bathing and getting dressed become very difficult.   Disabling pain 8 Self-care is no longer possible. At this level, pain is disabling. The individual is unable to do even the most "basic" activities such as walking, eating, bathing, dressing, transferring to a bed, or toileting. Fine motor skills are lost. It is difficult to think clearly.   Incapacitating pain 9 Pain becomes incapacitating. Thought processing is  no longer possible. Difficult to remember your own name. Control of movement and coordination are lost.   The worst pain imaginable 10 At this level, most patients pass out from pain. When this level is reached, collapse of the autonomic nervous system occurs, leading to a  sudden drop in blood pressure and heart rate. This in turn results in a temporary and dramatic drop in blood flow to the brain, leading to a loss of consciousness. Fainting is one of the body's self defense mechanisms. Passing out puts the brain in a calmed state and causes it to shut down for a while, in order to begin the healing process.    Summary: 1. Refer to this scale when providing [place] with your pain level. 2. Be accurate and careful when reporting your pain level. This will help with your care. 3. Over-reporting your pain level will lead to loss of credibility. 4. Even a level of 1/10 means that there is pain and will be treated at our facility. 5. High, inaccurate reporting will be documented as "Symptom Exaggeration", leading to loss of credibility and suspicions of possible secondary gains such as obtaining more narcotics, or wanting to appear disabled, for fraudulent reasons. 6. Only pain levels of 5 or below will be seen at our facility. 7. Pain levels of 6 and above will be sent to the Emergency Department and the appointment cancelled. _____________________________________________________________________________________________

## 2017-04-25 NOTE — Patient Instructions (Signed)

## 2017-04-26 ENCOUNTER — Telehealth: Payer: Self-pay

## 2017-04-26 ENCOUNTER — Ambulatory Visit: Payer: Managed Care, Other (non HMO) | Admitting: Internal Medicine

## 2017-04-26 NOTE — Telephone Encounter (Signed)
Pts insurance has ended pt will need new insurance so that I can continue with scheduling patient NM whole body bone scan. Can't continue with out updated insurance information. Pt is aware of this... She is supposed to bring in new insurance card

## 2017-04-28 LAB — SPECIMEN STATUS REPORT

## 2017-04-29 LAB — 25-HYDROXY VITAMIN D LCMS D2+D3: 25-Hydroxy, Vitamin D: 36 ng/mL

## 2017-04-29 LAB — 25-HYDROXYVITAMIN D LCMS D2+D3: 25-HYDROXY, VITAMIN D-3: 36 ng/mL

## 2017-05-10 ENCOUNTER — Encounter: Payer: Managed Care, Other (non HMO) | Attending: Internal Medicine | Admitting: Internal Medicine

## 2017-05-10 DIAGNOSIS — I10 Essential (primary) hypertension: Secondary | ICD-10-CM | POA: Diagnosis not present

## 2017-05-10 DIAGNOSIS — L89314 Pressure ulcer of right buttock, stage 4: Secondary | ICD-10-CM | POA: Diagnosis not present

## 2017-05-10 DIAGNOSIS — K219 Gastro-esophageal reflux disease without esophagitis: Secondary | ICD-10-CM | POA: Diagnosis not present

## 2017-05-10 DIAGNOSIS — F17210 Nicotine dependence, cigarettes, uncomplicated: Secondary | ICD-10-CM | POA: Diagnosis not present

## 2017-05-10 DIAGNOSIS — F411 Generalized anxiety disorder: Secondary | ICD-10-CM | POA: Diagnosis not present

## 2017-05-10 DIAGNOSIS — J449 Chronic obstructive pulmonary disease, unspecified: Secondary | ICD-10-CM | POA: Insufficient documentation

## 2017-05-10 DIAGNOSIS — E1142 Type 2 diabetes mellitus with diabetic polyneuropathy: Secondary | ICD-10-CM | POA: Diagnosis not present

## 2017-05-12 NOTE — Progress Notes (Signed)
Katelyn, [name] ([id number]) Visit Report for 05/10/2017 Arrival Information Details Patient Name: [name], [name] Date of Service: 05/10/2017 8:00 AM Medical Record Patient Account Number: [id number] Number: Treating RN: Ahmed Prima April 09, 1961 (56 y.o. Other Clinician: Date of Birth/Sex: Female) Treating ROBSON, MICHAEL Primary Care Bond Grieshop: Lavera Guise Rivaldo Hineman/Extender: G Referring Druanne Bosques: Sallee Lange in Treatment: 32 Visit Information History Since Last Visit All ordered tests and consults were completed: No Patient Arrived: Wheel Chair Added or deleted any medications: No Arrival Time: 08:04 Any new allergies or adverse reactions: No Accompanied By: self Had a fall or experienced change in No Transfer Assistance: EasyPivot activities of daily living that may affect Patient Lift risk of falls: Patient Identification Verified: Yes Signs or symptoms of abuse/neglect since last No Secondary Verification Process Yes visito Completed: Hospitalized since last visit: No Patient Requires Transmission- No Has Dressing in Place as Prescribed: Yes Based Precautions: Pain Present Now: No Patient Has Alerts: Yes Patient Alerts: DM II Electronic Signature(s) Signed: 05/10/2017 5:09:08 PM By: Alric Quan Entered By: Alric Quan on 05/10/2017 08:06:10 Ezelle, Georgia [name] ([id number]) -------------------------------------------------------------------------------- Encounter Discharge Information Details Patient Name: [name] Date of Service: 05/10/2017 8:00 AM Medical Record Patient Account Number: [id number] Number: Treating RN: Ahmed Prima May 28, 1961 (56 y.o. Other Clinician: Date of Birth/Sex: Female) Treating ROBSON, MICHAEL Primary Care Egon Dittus: Lavera Guise Oluwateniola Leitch/Extender: G Referring Cathey Fredenburg: Sallee Lange in Treatment: 53 Encounter Discharge Information Items Discharge Pain Level: 0 Discharge Condition:  Stable Ambulatory Status: Wheelchair Discharge Destination: Home Transportation: Other Accompanied By: self Schedule Follow-up Appointment: Yes Medication Reconciliation completed and provided to Patient/Care No Johnluke Haugen: Provided on Clinical Summary of Care: 05/10/2017 Form Type Recipient Paper Patient TW Electronic Signature(s) Signed: 05/10/2017 8:53:23 AM By: Ruthine Dose Entered By: Ruthine Dose on 05/10/2017 08:53:23 [name] ([id number]) -------------------------------------------------------------------------------- Lower Extremity Assessment Details Patient Name: [name] Date of Service: 05/10/2017 8:00 AM Medical Record Patient Account Number: [id number] Number: Treating RN: Ahmed Prima 30-Sep-1961 (56 y.o. Other Clinician: Date of Birth/Sex: Female) Treating ROBSON, MICHAEL Primary Care Gracious Renken: Lavera Guise Theodis Kinsel/Extender: G Referring Berlie Persky: Sallee Lange in Treatment: 33 Electronic Signature(s) Signed: 05/10/2017 5:09:08 PM By: Alric Quan Entered By: Alric Quan on 05/10/2017 08:15:29 Esch, Tarhonda Carlean Jews ([id number]) -------------------------------------------------------------------------------- Multi Wound Chart Details Patient Name: [name] Date of Service: 05/10/2017 8:00 AM Medical Record Patient Account Number: [id number] Number: Treating RN: Ahmed Prima [birth year] (56 y.o. Other Clinician: Date of Birth/Sex: Female) Treating ROBSON, MICHAEL Primary Care Roxanne Orner: Lavera Guise Nathali Vent/Extender: G Referring Charis Juliana: Sallee Lange in Treatment: 33 Vital Signs Height(in): 63 Pulse(bpm): 87 Weight(lbs): 257 Blood Pressure 134/68 (mmHg): Body Mass Index(BMI): 46 Temperature(F): 97.6 Respiratory Rate 18 (breaths/min): Photos: [3:No Photos] [N/A:N/A] Wound Location: [3:Right Gluteal fold] [N/A:N/A] Wounding Event: [3:Pressure Injury] [N/A:N/A] Primary Etiology: [3:Pressure  Ulcer] [N/A:N/A] Comorbid History: [3:Asthma, Hypertension, Type II Diabetes, Neuropathy] [N/A:N/A] Date Acquired: [3:04/21/2016] [N/A:N/A] Weeks of Treatment: [3:33] [N/A:N/A] Wound Status: [3:Open] [N/A:N/A] Measurements [name] x W x D 1x1.5x3.3 [N/A:N/A] (cm) Area (cm) : [3:1.178] [N/A:N/A] Volume (cm) : [3:3.888] [N/A:N/A] % Reduction in Area: [3:61.60%] [N/A:N/A] % Reduction in Volume: 77.40% [N/A:N/A] Classification: [3:Category/Stage IV] [N/A:N/A] Exudate Amount: [3:Large] [N/A:N/A] Exudate Type: [3:Serous] [N/A:N/A] Exudate Color: [3:amber] [N/A:N/A] Foul Odor After [3:Yes] [N/A:N/A] Cleansing: Odor Anticipated Due to No [N/A:N/A] Product Use: Wound Margin: [3:Distinct, outline attached] [N/A:N/A] Granulation Amount: [3:Medium (34-66%)] [N/A:N/A] Granulation Quality: [3:Red] [N/A:N/A] Necrotic Amount: [3:Medium (34-66%)] [N/A:N/A] Exposed Structures: Fat Layer (Subcutaneous N/A N/A Tissue) Exposed: Yes Epithelialization:  None N/A N/A Debridement: Debridement ([id number]- N/A N/A [id number]) Pre-procedure 08:20 N/A N/A Verification/Time Out Taken: Pain Control: Lidocaine 4% Topical N/A N/A Solution Tissue Debrided: Fibrin/Slough, Exudates, N/A N/A Subcutaneous Level: Skin/Subcutaneous N/A N/A Tissue Debridement Area (sq 1.5 N/A N/A cm): Instrument: Other(scoop) N/A N/A Bleeding: Minimum N/A N/A Hemostasis Achieved: Pressure N/A N/A Procedural Pain: 0 N/A N/A Post Procedural Pain: 0 N/A N/A Debridement Treatment Procedure was tolerated N/A N/A Response: well Post Debridement 1x1.5x3.3 N/A N/A Measurements [name] x W x D (cm) Post Debridement 3.888 N/A N/A Volume: (cm) Post Debridement Category/Stage IV N/A N/A Stage: Periwound Skin Texture: Scarring: Yes N/A N/A Excoriation: No Induration: No Callus: No Crepitus: No Rash: No Periwound Skin Maceration: Yes N/A N/A Moisture: Dry/Scaly: No Periwound Skin Color: Atrophie Blanche: No N/A N/A Cyanosis: No Ecchymosis:  No Erythema: No Hemosiderin Staining: No Mottled: No Pallor: No Rubor: No Temperature: No Abnormality N/A N/A Tenderness on Yes N/A N/A Palpation: Wound Preparation: N/A N/A [name]. ([id number]) Ulcer Cleansing: Rinsed/Irrigated with Saline Topical Anesthetic Applied: Other: lidocaine 4% Procedures Performed: Debridement N/A N/A Treatment Notes Electronic Signature(s) Signed: 05/10/2017 4:43:34 PM By: Linton Ham MD Entered By: Linton Ham on 05/10/2017 08:30:28 Mcclaren, Georgia [name] ([id number]) -------------------------------------------------------------------------------- Multi-Disciplinary Care Plan Details Patient Name: [name] Date of Service: 05/10/2017 8:00 AM Medical Record Patient Account Number: [id number] Number: Treating RN: Ahmed Prima 1961/02/18 (56 y.o. Other Clinician: Date of Birth/Sex: Female) Treating ROBSON, MICHAEL Primary Care Faolan [place]: Lavera Guise Lanell Carpenter/Extender: G Referring Ednah Hammock: Sallee Lange in Treatment: 43 Active Inactive ` Abuse / Safety / Falls / Self Care Management Nursing Diagnoses: Potential for falls Goals: Patient will remain injury free Date Initiated: 09/21/2016 Target Resolution Date: 03/25/2017 Goal Status: Active Interventions: Assess fall risk on admission and as needed Notes: ` Nutrition Nursing Diagnoses: Imbalanced nutrition Potential for alteratiion in Nutrition/Potential for imbalanced nutrition Goals: Patient/caregiver agrees to and verbalizes understanding of need to use nutritional supplements and/or vitamins as prescribed Date Initiated: 09/21/2016 Target Resolution Date: 03/25/2017 Goal Status: Active Patient/caregiver verbalizes understanding of need to maintain therapeutic glucose control per primary care physician Date Initiated: 09/21/2016 Target Resolution Date: 03/25/2017 Goal Status: Active Interventions: Assess patient nutrition upon admission and as needed  per policy Gaspari, [place]. ([id number]) Notes: ` Orientation to the Wound Care Program Nursing Diagnoses: Knowledge deficit related to the wound healing center program Goals: Patient/caregiver will verbalize understanding of the [facility] Date Initiated: 09/21/2016 Target Resolution Date: 03/25/2017 Goal Status: Active Interventions: Provide education on orientation to the wound center Notes: ` Pain, Acute or Chronic Nursing Diagnoses: Pain, acute or chronic: actual or potential Potential alteration in comfort, pain Goals: Patient will verbalize adequate pain control and receive pain control interventions during procedures as needed Date Initiated: 09/21/2016 Target Resolution Date: 03/25/2017 Goal Status: Active Patient/caregiver will verbalize adequate pain control between visits Date Initiated: 09/21/2016 Target Resolution Date: 03/25/2017 Goal Status: Active Patient/caregiver will verbalize comfort level met Date Initiated: 09/21/2016 Target Resolution Date: 03/25/2017 Goal Status: Active Interventions: Assess comfort goal upon admission Complete pain assessment as per visit requirements Notes: ` Wound/Skin Impairment Nursing Diagnoses: TAMAKIA, PORTO ([id number]) Impaired tissue integrity Goals: Ulcer/skin breakdown will have a volume reduction of 30% by week 4 Date Initiated: 09/21/2016 Target Resolution Date: 03/25/2017 Goal Status: Active Ulcer/skin breakdown will have a volume reduction of 50% by week 8 Date Initiated: 09/21/2016 Target Resolution Date: 03/25/2017 Goal Status: Active Ulcer/skin breakdown will have a volume reduction of 80% by week  12 Date Initiated: 09/21/2016 Target Resolution Date: 03/25/2017 Goal Status: Active Interventions: Assess ulceration(s) every visit Notes: Electronic Signature(s) Signed: 05/10/2017 5:09:08 PM By: Alric Quan Entered By: Alric Quan on 05/10/2017 08:19:45 [place], [place]  ([id number]) -------------------------------------------------------------------------------- Pain Assessment Details Patient Name: [name] Date of Service: 05/10/2017 8:00 AM Medical Record Patient Account Number: [id number] Number: Treating RN: Ahmed Prima March 15, 1961 (56 y.o. Other Clinician: Date of Birth/Sex: Female) Treating ROBSON, MICHAEL Primary Care Quiana Cobaugh: Lavera Guise Maie Kesinger/Extender: G Referring Delena Casebeer: Sallee Lange in Treatment: 33 Active Problems Location of Pain Severity and Description of Pain Patient Has Paino No Site Locations With Dressing Change: No Pain Management and Medication Current Pain Management: Electronic Signature(s) Signed: 05/10/2017 5:09:08 PM By: Alric Quan Entered By: Alric Quan on 05/10/2017 08:06:16 Jiles, Georgia [name] ([id number]) -------------------------------------------------------------------------------- Patient/Caregiver Education Details Patient Name: [name] Date of Service: 05/10/2017 8:00 AM Medical Record Patient Account Number: [id number] Number: Treating RN: Ahmed Prima Mar 22, 1961 (56 y.o. Other Clinician: Date of Birth/Gender: Female) Treating ROBSON, MICHAEL Primary Care Physician: Lavera Guise Physician/Extender: G Referring Physician: Sallee Lange in Treatment: 4 Education Assessment Education Provided To: Patient Education Topics Provided Wound/Skin Impairment: Handouts: Other: change dressing as ordered Methods: Demonstration, Explain/Verbal Responses: State content correctly Electronic Signature(s) Signed: 05/10/2017 5:09:08 PM By: Alric Quan Entered By: Alric Quan on 05/10/2017 08:21:05 [place], Ghalia Carlean Jews ([id number]) -------------------------------------------------------------------------------- Wound Assessment Details Patient Name: [name] Date of Service: 05/10/2017 8:00 AM Medical Record Patient Account Number:  [id number] Number: Treating RN: Ahmed Prima 10-17-1961 (56 y.o. Other Clinician: Date of Birth/Sex: Female) Treating ROBSON, MICHAEL Primary Care Patina Spanier: Lavera Guise Durrell Barajas/Extender: G Referring Syrena Burges: Sallee Lange in Treatment: 33 Wound Status Wound Number: 3 Primary Pressure Ulcer Etiology: Wound Location: Right Gluteal fold Wound Status: Open Wounding Event: Pressure Injury Comorbid Asthma, Hypertension, Type II Date Acquired: 04/21/2016 History: Diabetes, Neuropathy Weeks Of Treatment: 33 Clustered Wound: No Photos Photo Uploaded By: Alric Quan on 05/10/2017 16:14:22 Wound Measurements Length: (cm) 1 Width: (cm) 1.5 Depth: (cm) 3.3 Area: (cm) 1.178 Volume: (cm) 3.888 % Reduction in Area: 61.6% % Reduction in Volume: 77.4% Epithelialization: None Tunneling: No Undermining: No Wound Description Classification: Category/Stage IV Foul Odor Aft Wound Margin: Distinct, outline attached Due to Produc Exudate Amount: Large Slough/Fibrin Exudate Type: Serous Exudate Color: amber er Cleansing: Yes t Use: No o Yes Wound Bed Granulation Amount: Medium (34-66%) Exposed Structure Granulation Quality: Red Fat Layer (Subcutaneous Tissue) Exposed: Yes Necrotic Amount: Medium (34-66%) Vierling, Sandrea [name]. ([id number]) Necrotic Quality: Adherent Slough Periwound Skin Texture Texture Color No Abnormalities Noted: No No Abnormalities Noted: No Callus: No Atrophie Blanche: No Crepitus: No Cyanosis: No Excoriation: No Ecchymosis: No Induration: No Erythema: No Rash: No Hemosiderin Staining: No Scarring: Yes Mottled: No Pallor: No Moisture Rubor: No No Abnormalities Noted: No Dry / Scaly: No Temperature / Pain Maceration: Yes Temperature: No Abnormality Tenderness on Palpation: Yes Wound Preparation Ulcer Cleansing: Rinsed/Irrigated with Saline Topical Anesthetic Applied: Other: lidocaine 4%, Treatment Notes Wound #3 (Right  Gluteal fold) 1. Cleansed with: Clean wound with Normal Saline 2. Anesthetic Topical Lidocaine 4% cream to wound bed prior to debridement 3. Peri-wound Care: Antifungal powder 4. Dressing Applied: Prisma Ag 5. Secondary Dressing Applied Dry Gauze 8. Negative Pressure Wound Therapy Wound Vac to wound continuously at 137m/hg pressure Black Foam White Foam Notes 1 PIECE BLACK FOAM TO BRIDGE I piece of white foam Electronic Signature(s) Signed: 05/10/2017 5:09:08 PM By: PAlric QuanEntered By: PAlric Quanon  05/10/2017 08:15:19 Abella, Georgia [name] ([id number]) -------------------------------------------------------------------------------- Vitals Details Patient Name: [name] Date of Service: 05/10/2017 8:00 AM Medical Record Patient Account Number: [id number] Number: Treating RN: Ahmed Prima Mar 26, 1961 (56 y.o. Other Clinician: Date of Birth/Sex: Female) Treating ROBSON, MICHAEL Primary Care Jhon Mallozzi: Lavera Guise Ahmod Gillespie/Extender: G Referring Maudie Shingledecker: Sallee Lange in Treatment: 33 Vital Signs Time Taken: 08:05 Temperature (F): 97.6 Height (in): 63 Pulse (bpm): 87 Weight (lbs): 257 Respiratory Rate (breaths/min): 18 Body Mass Index (BMI): 45.5 Blood Pressure (mmHg): 134/68 Reference Range: 80 - 120 mg / dl Electronic Signature(s) Signed: 05/10/2017 5:09:08 PM By: Alric Quan Entered By: Alric Quan on 05/10/2017 08:06:38

## 2017-05-12 NOTE — Progress Notes (Addendum)
Katelyn, [name] ([id number]) Visit Report for 05/10/2017 Debridement Details Patient Name: [name], [name] Date of Service: 05/10/2017 8:00 AM Medical Record Patient Account Number: [id number] Number: Treating RN: Phillis Haggis [birth year] (56 y.o. Other Clinician: Date of Birth/Sex: Female) Treating ROBSON, MICHAEL Primary Care Provider: Joen [name] Provider/Extender: G Referring Provider: Tedra Senegal in Treatment: 33 Debridement Performed for Wound #3 Right Gluteal fold Assessment: Performed By: Physician Maxwell Caul, MD Debridement: Debridement Pre-procedure Yes - 08:20 Verification/Time Out Taken: Start Time: 08:21 Pain Control: Lidocaine 4% Topical Solution Level: Skin/Subcutaneous Tissue Total Area Debrided (L x 1 (cm) x 1.5 (cm) = 1.5 (cm) W): Tissue and other Viable, Non-Viable, Exudate, Fibrin/Slough, Subcutaneous material debrided: Instrument: Other : scoop Bleeding: Minimum Hemostasis Achieved: Pressure End Time: 08:25 Procedural Pain: 0 Post Procedural Pain: 0 Response to Treatment: Procedure was tolerated well Post Debridement Measurements of Total Wound Length: (cm) 1 Stage: Category/Stage IV Width: (cm) 1.5 Depth: (cm) 3.3 Volume: (cm) 3.888 Character of Wound/Ulcer Post Requires Further Debridement: Debridement Severity of Tissue Post Fat layer exposed Debridement: Post Procedure Diagnosis Same as Pre-procedure Katelyn, [name] ([id number]) Electronic Signature(s) Signed: 05/10/2017 4:43:34 PM By: Baltazar Najjar MD Signed: 05/10/2017 5:09:08 PM By: Alejandro Mulling Entered By: Baltazar Najjar on 05/10/2017 08:30:57 [name], [name] ([id number]) -------------------------------------------------------------------------------- HPI Details Patient Name: [name] Date of Service: 05/10/2017 8:00 AM Medical Record Patient Account Number: [id number] Number: Treating RN: Phillis Haggis 1961-11-12 (56 y.o. Other  Clinician: Date of Birth/Sex: Female) Treating ROBSON, MICHAEL Primary Care Provider: Joen [name] Provider/Extender: G Referring Provider: Tedra Senegal in Treatment: 80 History of Present Illness Location: bilateral gluteal ulcerations and left popliteal ulceration with tunneling Quality: adits to intermittent aching to ischial ulcers, no discomfort to sinus tract Severity: right iscial ulcer with increased depth Duration: chronic ulcers to bilateral ischial ulcers and sinus tract Timing: The pain is intermittent in severity as far as how intense it becomes but is present all the time. Manipulationn makes this worse. Context: The wound occurred when the patient had a fall and was unconscious for about 48 hours laying on the floor and she had pressure injury at that stage. Modifying Factors: Other treatment(s) tried include:as noted below she has been seen by visiting wound care physicians or nurse practitioners and details have been noted Associated Signs and Symptoms: has yet to receive offloading cushions and mattress overlay HPI Description: 56 year old patient who was seen by visiting [name] wound care specialist for a wound on both her buttock and was found to have an unstageable wound on the right buttock for about 2 months. I understand that she had a fall and was laying on the floor for about 48 hours before she was found and taken to the ICU and had a long injury to her gluteal area from pressure and also had broken her right humerus. She has had a right proximal humerus fracture and has been followed up with orthopedics recently. The patient has a past medical history of type 2 diabetes mellitus, paraparesis, acute pyelonephritis, GERD, hypertension, glaucoma, chronic pain, anxiety neurosis, nicotine dependence, COPD. the patient had some debridement done and was to operative was recommended to use Silvadene dressing and offloading. She is a smoker and occasionally smokes  a few cigarettes. the patient requested a second opinion for months and is here to discuss her care. 09/21/16; the patient re-presents from home today for review of 3 different wounds. I note that she was seen in the clinic here in  July at which time she had bilateral buttock wounds. It was apparently suggested at that time that she use a wound VAC bridged to both wounds just near the initial tuberosity's bilaterally which she refused. The history was a bit difficult to put together. Apparently this patient became ill at the end of April of this year. She was found sitting on the floor she had apparently been for 2 days and subsequently admitted to hospital from 04/23/16 through 05/02/16 and at that point she was critically ill ultimately having sepsis secondary to UTI, nontraumatic rhabdomyolysis and diabetic ketoacidosis. She had acute renal failure and I think required ICU care including intubation. Patient states her wounds actually started at that point on the bilateral issue tuberosities however in reviewing the discharge summary from 5/8 I see no reference to wounds at that point. It did state that she had left lower extremity cellulitis however. Reviewing Epic I see no relevant x-rays. It would appear that her discharge creatinine was within the normal range and indeed on 9/15 her creatinine has remained normal. [facility] [name], [name]. ([id number]) rehabilitation. There the wounds on her bilateral Buttocks were dressed. Only just before her discharge from the nursing facility she developed an "knot" which was interpreted as cellulitis on the posterior aspect of her left knee she was given antibiotics. Apparently sometime late in July a this actually opened and became a wound at home health care was tending to however she is still having purulent drainage coming from this and by my understanding the wound depth is actually become unmeasurable. I am  not really clear about what home health has been placing in any of these wound areas. The patient states that is something with silver and it. She is not been systemically unwell no fever or chills her appetite is good. She is a diabetic poorly controlled however she states that her recent blood sugars at home have been in the low to mid 100s. 09/28/16 On evaluation today patient appears to continue to exhibit the 3 areas of ulceration that were noted previous. She did have an x-ray of the right pelvis which showed evidence of potential soft tissue infection but no obvious osteomyelitis. There was a discussion last office visit concerning the possibility of a wound VAC. Witth that being said the x-ray report suggested that an MRI may be more appropriate to further evaluate the extent. Subsequently in regard to the wound over the popliteal portion of the left lower extremity with tunneling at 12:00 the CT scan that was ordered was denied by insurance as they state the patient has not had x-rays prior to advanced imaging. Patient states that she is frustrated with the situation overall. 10/05/16 in the interval since I last saw this patient last week she has had the x-ray of the knee performed. I did review that x-ray today and fortunately shows no evidence of osteomyelitis or other acute abnormality at this point in time. She continues to have the opening iin the posterior left popliteal space with tracking proximal up the posterior thigh. Nothing seems to have worsened but it also seems to have not improved. The same is true in regard to the right pressure ulcer over the gluteal region which extends toward the ischium. The left gluteal pressure ulcer actually appears to be doing somewhat better my opinion there is some necrotic slough but overall this appears fairly well. She tells me thatt she has some discomfort especially when home  health is helping her with dressing changes as they do not know  her. At worse she rates her pain to be a 5 out of 10 right now it's more like a 1 out of 10. 10/07/16; still the patient has 3 different wound areas. She has a deep stage IV wound over her right ischial tuberosity. She is due to have an MRI next week. The wound over her left ischial tuberosity is more superficial and underwent debridement today. Finally she has a small open area in her left popliteal fossa the probes on measurably forward superiorly. Still a lot of drainage coming out of this. The CT scan that I ordered 3 weeks ago was questioned by her insurance company wanting a plain x-ray first. As I understand things result of this is nothing has been done in 3 weeks in terms of imaging the thigh and she has an MRI booked of this along with her pelvis for next week line 10/12/16; patient has a deep probing wound over the right ischiall tuberosity, stage III wound over the left visual tuberosity and a draining sinus in her left popliteal fossa. None of this much different from when I saw this 3 weeks ago. We have been using silver rope to the right ischial wound and a draining area in the left popliteal fossa. Plain silver alginate to the area on the left ischial tuberosity 10/19/16; the patient's wounds are essentially unchanged although the area on the left lower gluteal is actually improved. Our intake nurse noted drainage from the right initial tuberosity probing wound as well as the draining area in the left popliteal fossa. Both of these were cultured. She had x-rays I think at the insistence of her insurance company on 09/23/16 x-ray of the pelvis was not particularly helpful she did have soft tissue air over the right lower pelvis although with the depth of this wound this is not surprising. An x-ray of her left knee did not show any specific abnormalities. We are still using silver alginate to these wound areas. Her MRI is booked for 10/27 10/26/16; cultures of the purulent drainage in  her right initial tuberosity wound grew moderate Proteus and few staph aureus. The same organisms were cultured out of the left knee sinus tract posteriorly. The staph aureus is MRSA. I had started her on Augmentin last week I added doxycycline. The MRI of the left lower extremity and pelvis was finally done. The MRI of the femur showed subcutaneous soft tissue swelling [name], [name] ([id number]) edema fluid and myositis in the vastus lateralis muscle but no soft tissue abscess septic arthritis or osteomyelitis. MRI of the pelvis showed the left wound to be more expensive extending down to the bone there was osteomyelitis. Left hamstring tendons were also involved. No septic arthritis involving the hip. The decubitus ulcer on the right side showed no definite osteomyelitis or abscess.. The right hip wound is actually the one the probes 6 cm downward. But the MRI showing infection including osteomyelitis on the left explains the draining sinus in the popliteal fossa on the left. She did have antibiotics in the hospitalization last time and this extended into her nursing home stay but I'm not exactly sure what antibiotics and for what duration. According the patient this did include vancomycin with considerable effort of our staff we are able to get the patient into see Dr. Sampson Goon today. There were transportation difficulties. Her mother had open heart surgery and is in the ICU in [place] therefore her  brother was unable to transport. Dr. Jarrett Ables office graciously arranged time to see her today. From my point of view she is going to require IV vancomycin plus perhaps a third generation cephalosporin. I plan to keep her on doxycycline and Augmentin until the IV antibiotics can be arranged. 11/02/2016 - Blayre presents today for management of ulcers; She saw Dr. Sampson Goon (infectious disease) last week who prescribed Zosyn and Vancomycin for MRI confirmation of osteomyelits to the left  ischiium. She is to have the PICC line placed today and receive the initial dose for both antibiotics today. She has yet to receive the offloading chair cushion and/or mattress overlay from home health, apparently this has been a 3 week process. I encouraged her to speak to home health regarding this matter, along with offering home health to contact the wouns care center with any questions or concerns. The left ischial pressure ulcer continues to imporve, while to right ischial ulcer has increased in depth. The popliteal fossa sinus tract remains unmeasurable due to the limitation of depth measurement (tract extends beyond our measuring devices). 11-16-2016 Ms. Bistline presents today for evaluation and management of bilateral ischial stage IV pressure ulcers and sinus tract to the left popliteal fossa. she is under the care of Dr. Sampson Goon for IV antibiotic therapy; she states that the vancomycin was placed on hold and will be restarted at a lower dose based on her renal function. She continues taking Zosyn in addition to the vancomycin. She also states that she has yet to receive offloading cushions from home health, according to her she does not qualify for these offloading cushions because "the ulcers are unstageable ". We will contact the home health agency today to lend clarity regarding her pressure ulcers. The left popliteal fossa sinus tract continues to be a measurable as it extends beyond the length of our measuring devices.. 11/30/16; the patient is still on vancomycin and Zosyn. The depth of the draining sinus behind her left popliteal fossa is down to 4 cm although there is still serosanguineous drainage coming out of this. She saw Dr. Sampson Goon of infectious disease yesterday the idea is to weeks more of IV antibiotics and then oral antibiotics although I have not read his note. The area on the left gluteal fold is just about healed. She has a 6 cm draining sinus over the right  initial tuberosity although I cannot feel bone at the base of this. As far as the patient is aware she has not had a recheck of her inflammatory markers. 12/07/16; patient is on vancomycin and Zosyn appointment with Dr. Sampson Goon on the 19th at which point the patient expects to have a change in antibiotics. Remarkable improvement over the wound over the left ischial tuberosity which is just about closed. The draining sinus in her popliteal fossa has 0.4 cm in depth. The area on the right ischial tuberosity still probes down 7 cm. This is closed and overall wound dimensions but not depth. 12/14/16; patient is completing her vancomycin and Zosyn and per her she is going to transition to Bactrim and Augmentin for another 3 weeks. The area on her left gluteal fold is closed except for some skin tears. The area behind her left knee is no longer has any depth. The only remaining area that is of clinical concern is on the right gluteal fold probing towards the right ischial tuberosity. Today this measures 6.9 cm in depth. Very gritty surface 12/21/16; patient is now on Bactrim and Augmentin as directed  by infectious disease. This should be for another 2 weeks. The area in her left gluteal fold and left popliteal are closed over and fully healed. [name], [name] ([id number]) Measurements today at 7 cm in the right buttock wound is unchanged from last week. 12/27/16; patient is on Bactrim and Augmentin for another week as directed by infectious disease. She has completed her IV antibiotics. She is not been systemically unwell no fever no chills. The area on her right buttock measured over 6 cm in depth. There is no palpable bone. No evidence of surrounding soft tissue infection. She is complaining of tongue irritation and has a history of thrush 01/04/17; patient is been back to see Dr. Sampson Goon, her Augmentin was stopped but he continued the Bactrim for another 3 weeks. Depth of the wound is 6.7 cm there  is been no major change in either direction. She is not receive the wound VAC from home health I think because of confusion about who is supposed to provided will actually talk to the [facility] agency [facility] The net no major change. 01/18/17; patient obtained her wound VAC about 10 days ago however for some reason it was not actually put on the wound. She is therefore here for [place] to apply this I guess. No other issues are noted. She is not complaining of pain fever drainage 02/01/17; patient is here now having the wound VAC or 3-4 weeks to a deep pressure area over the right initial tuberosity. This measures 6 cm in depth today which is about half a centimeter better than 2 weeks ago. There is no evidence she is systemically unwell no fever no chills no pain around the area. 02/15/17; I follow this patient every 2 weeks for a deep area over the right ischial tuberosity. This measures 5.5 cm today which is a continued improvement of 1.2 cm from 1/10 and down 0.5 cm from her visit 2 weeks ago 03/01/17; continued difficult area over the right initial tuberosity using the wound VAC. Depth today of 5.1 cm which is improved. Does not appear to be a lot of drainage in the canister. Antibiotics were finally stopped by Dr. Sampson Goon bactrim[]  and inflammatory markers have been repeated 03/15/17; fall this lady every 2 weeks for a difficult area over her lower right gluteal area/ischial tuberosity. Depth today at 4.6 cm. This is a slow but steady improvement in the depth of this wound. Although we have labeled this as a pressure ulcer there may have been an underlying infection here at one point before we saw her. She had osteomyelitis on the left extensively which is since resolved 03/29/17- patient is here for follow-up evaluation of her right ischial pressure ulcer. She continues with the wound VAC and home health. According to the nurse home health has been using less foams and appropriate and we  will instruct accordingly. The patient continues to smoke, approximately 10 cigarettes a day. She has been advised to decrease that in half by her next appointment, with a goal of complete cessation. She states her blood sugars have been consistently less than 150. She states that she spends most of her day position left lateral or prone. She does have an air mattress on her bed, she does not have an mattress for her chair, she states she cannot afford this. 04/12/17; patient is here for evaluation of her right ischial pressure ulcer. We continue to use a wound VAC with minimal improvement today the depth of this measuring 4.4 cm versus  4.6 cm 2 weeks ago. We are using a [employer] wound VAC on this area. There is not excessive drainage no pain. The patient tells me she tries to keep off this in bed but is up in the wheelchair for 2 hours a day. She is limited in no her overall ambulation but is improving and apparently is getting bilateral lower extremity braces which she hopes will improve her ability to walk independently. 05/10/17; Depth at 3.3cm. Improved Electronic Signature(s) Signed: 05/10/2017 4:43:34 PM By: Baltazar Najjar MD Entered By: Baltazar Najjar on 05/10/2017 08:31:48 Hardcastle, [name] ([id number]) -------------------------------------------------------------------------------- Physical Exam Details Patient Name: [name] Date of Service: 05/10/2017 8:00 AM Medical Record Patient Account Number: [id number] Number: Treating RN: Phillis Haggis 01/08/1961 (56 y.o. Other Clinician: Date of Birth/Sex: Female) Treating ROBSON, MICHAEL Primary Care Provider: Joen [name] Provider/Extender: G Referring Provider: Tedra Senegal in Treatment: 33 Constitutional Sitting or standing Blood Pressure is within target range for patient.. Pulse regular and within target range for patient.Marland Kitchen Respirations regular, non-labored and within target range.. Temperature is normal and  within the target range for the patient.. Patient's appearance is neat and clean. Appears in no acute distress. Well nourished and well developed.. Notes wound exam; 3.3cm of depth. Debrided with an open curette necrotic surface material which cannot be really visualized. Some surrounding skin erythema which is itchy. oconctact vs candida/ tinea Electronic Signature(s) Signed: 05/10/2017 4:43:34 PM By: Baltazar Najjar MD Entered By: Baltazar Najjar on 05/10/2017 08:33:30 [name], [name] -------------------------------------------------------------------------------- Physician Orders Details Patient Name: [name] Date of Service: 05/10/2017 8:00 AM Medical Record Patient Account Number: [id number] Number: Treating RN: Phillis Haggis 1961-02-26 (56 y.o. Other Clinician: Date of Birth/Sex: Female) Treating ROBSON, MICHAEL Primary Care Provider: Joen [name] Provider/Extender: G Referring Provider: Tedra Senegal in Treatment: 3 Verbal / Phone Orders: Yes Clinician: Ashok Cordia, Debi Read Back and Verified: Yes Diagnosis Coding Wound Cleansing Wound #3 Right Gluteal fold o Clean wound with Normal Saline. o Cleanse wound with mild soap and water Anesthetic Wound #3 Right Gluteal fold o Topical Lidocaine 4% cream applied to wound bed prior to debridement - clinic use only Skin Barriers/Peri-Wound Care Wound #3 Right Gluteal fold o Skin Prep o Antifungal powder-Nystatin - Nystatin Powder to skin then gauze under drape of wound vac Primary Wound Dressing Wound #3 Right Gluteal fold o Dry Gauze - over the nystatin powder o Prisma Ag - (silver collagen) moisten with saline place in wound all the way to wound bed under wound vac then place the white foam Dressing Change Frequency Wound #3 Right Gluteal fold o Three times weekly - HHRN to change MONDAY, and FRIDAY and pt to come to wound clinic on [facility], every other week. Follow-up  Appointments Wound #3 Right Gluteal fold o Return Appointment in 2 weeks. Off-Loading Wound #3 Right Gluteal fold o Roho cushion for wheelchair - *********[facility] to order*********** [name], [name] ([id number]) Home Health Wound #3 Right Gluteal fold o Continue Home Health Visits - [facility] *****Please order [facility] cushion****** o Home Health Nurse may visit PRN to address patientos wound care needs. o FACE TO FACE ENCOUNTER: MEDICARE and MEDICAID PATIENTS: I certify that this patient is under my care and that I had a face-to-face encounter that meets the physician face-to-face encounter requirements with this patient on this date. The encounter with the patient was in whole or in part for the following MEDICAL CONDITION: (primary reason for Home Healthcare) MEDICAL  NECESSITY: I certify, that based on my findings, NURSING services are a medically necessary home health service. HOME BOUND STATUS: I certify that my clinical findings support that this patient is homebound (i.e., Due to illness or injury, pt requires aid of supportive devices such as crutches, cane, wheelchairs, walkers, the use of special transportation or the assistance of another person to leave their place of residence. There is a normal inability to leave the home and doing so requires considerable and taxing effort. Other absences are for medical reasons / religious services and are infrequent or of short duration when for other reasons). o If current dressing causes regression in wound condition, may D/C ordered dressing product/s and apply Normal Saline Moist Dressing daily until next Wound Healing Center / Other MD appointment. Notify Wound Healing Center of regression in wound condition at [phone]. o Please direct any NON-WOUND related issues/requests for orders to patient's Primary Care Physician Negative Pressure Wound Therapy Wound #3 Right Gluteal fold o Wound VAC  settings at 125/130 mmHg continuous pressure. Use BLACK/GREEN foam to wound cavity. Use WHITE foam to fill any tunnel/s and/or undermining. Change VAC dressing 3 X WEEK. Change canister as indicated when full. Nurse may titrate settings and frequency of dressing changes as clinically indicated. - Prisma Ag (silver collagen) moisten with saline place in wound all the way to wound bed under wound vac then white foam Nystatin Powder to skin then gauze under drape of wound vac o Home Health Nurse may d/c VAC for s/s of increased infection, significant wound regression, or uncontrolled drainage. Notify Wound Healing Center at [phone]. o Number of foam/gauze pieces used in the dressing = Medications-please add to medication list. Wound #3 Right Gluteal fold o Other: - Vitamin C, Zinc, Multivitamins Nystatin Powder to skin then gauze under drape of wound vac Electronic Signature(s) Signed: 05/10/2017 4:43:34 PM By: Baltazar Najjar MD Signed: 05/10/2017 5:09:08 PM By: Alejandro Mulling Entered By: Alejandro Mulling on 05/10/2017 08:51:36 [name], [name] ([id number]) -------------------------------------------------------------------------------- Problem List Details Patient Name: [name] Date of Service: 05/10/2017 8:00 AM Medical Record Patient Account Number: [id number] Number: Treating RN: Phillis Haggis 04-Sep-1961 (56 y.o. Other Clinician: Date of Birth/Sex: Female) Treating ROBSON, MICHAEL Primary Care Provider: Joen [name] Provider/Extender: G Referring Provider: Tedra Senegal in Treatment: 31 Active Problems ICD-10 Encounter Code Description Active Date Diagnosis L89.314 Pressure ulcer of right buttock, stage 4 09/21/2016 Yes E11.42 Type 2 diabetes mellitus with diabetic polyneuropathy 09/21/2016 Yes Inactive Problems ICD-10 Code Description Active Date Inactive Date M86.18 Other acute osteomyelitis, other site 11/02/2016 11/02/2016 L97.129  Non-pressure chronic ulcer of left thigh with unspecified 09/21/2016 09/21/2016 severity Resolved Problems ICD-10 Code Description Active Date Resolved Date L89.323 Pressure ulcer of left buttock, stage 3 09/21/2016 09/21/2016 L89.324 Pressure ulcer of left buttock, stage 4 11/16/2016 11/16/2016 Electronic Signature(s) Signed: 05/10/2017 4:43:34 PM By: Baltazar Najjar MD [place], [name] ([id number]) Entered By: Baltazar Najjar on 05/10/2017 08:30:10 Pritts, [name] ([id number]) -------------------------------------------------------------------------------- Progress Note Details Patient Name: [name] Date of Service: 05/10/2017 8:00 AM Medical Record Patient Account Number: [id number] Number: Treating RN: Phillis Haggis July 31, 1961 (56 y.o. Other Clinician: Date of Birth/Sex: Female) Treating ROBSON, MICHAEL Primary Care Provider: Joen [name] Provider/Extender: G Referring Provider: Tedra Senegal in Treatment: 33 Subjective History of Present Illness (HPI) The following HPI elements were documented for the patient's wound: Location: bilateral gluteal ulcerations and left popliteal ulceration with tunneling Quality: adits to intermittent aching to ischial ulcers, no discomfort to sinus  tract Severity: right iscial ulcer with increased depth Duration: chronic ulcers to bilateral ischial ulcers and sinus tract Timing: The pain is intermittent in severity as far as how intense it becomes but is present all the time. Manipulationn makes this worse. Context: The wound occurred when the patient had a fall and was unconscious for about 48 hours laying on the floor and she had pressure injury at that stage. Modifying Factors: Other treatment(s) tried include:as noted below she has been seen by visiting wound care physicians or nurse practitioners and details have been noted Associated Signs and Symptoms: has yet to receive offloading cushions and mattress  overlay 56 year old patient who was seen by visiting [name] wound care specialist for a wound on both her buttock and was found to have an unstageable wound on the right buttock for about 2 months. I understand that she had a fall and was laying on the floor for about 48 hours before she was found and taken to the ICU and had a long injury to her gluteal area from pressure and also had broken her right humerus. She has had a right proximal humerus fracture and has been followed up with orthopedics recently. The patient has a past medical history of type 2 diabetes mellitus, paraparesis, acute pyelonephritis, GERD, hypertension, glaucoma, chronic pain, anxiety neurosis, nicotine dependence, COPD. the patient had some debridement done and was to operative was recommended to use Silvadene dressing and offloading. She is a smoker and occasionally smokes a few cigarettes. the patient requested a second opinion for months and is here to discuss her care. 09/21/16; the patient re-presents from home today for review of 3 different wounds. I note that she was seen in the clinic here in July at which time she had bilateral buttock wounds. It was apparently suggested at that time that she use a wound VAC bridged to both wounds just near the initial tuberosity's bilaterally which she refused. The history was a bit difficult to put together. Apparently this patient became ill at the end of April of this year. She was found sitting on the floor she had apparently been for 2 days and subsequently admitted to hospital from 04/23/16 through 05/02/16 and at that point she was critically ill ultimately having sepsis secondary to UTI, nontraumatic rhabdomyolysis and diabetic ketoacidosis. She had acute renal failure and I think required ICU care including intubation. Patient states her wounds actually started at that point on the bilateral issue tuberosities however in reviewing the discharge summary from 5/8 I see no  reference to Bartle, Devann L. ([id number]) wounds at that point. It did state that she had left lower extremity cellulitis however. Reviewing Epic I see no relevant x-rays. It would appear that her discharge creatinine was within the normal range and indeed on 9/15 her creatinine has remained normal. [facility] There the wounds on her bilateral Buttocks were dressed. Only just before her discharge from the nursing facility she developed an "knot" which was interpreted as cellulitis on the posterior aspect of her left knee she was given antibiotics. Apparently sometime late in July a this actually opened and became a wound at home health care was tending to however she is still having purulent drainage coming from this and by my understanding the wound depth is actually become unmeasurable. I am not really clear about what home health has been placing in any of these wound areas. The patient states that is something with silver and  it. She is not been systemically unwell no fever or chills her appetite is good. She is a diabetic poorly controlled however she states that her recent blood sugars at home have been in the low to mid 100s. 09/28/16 On evaluation today patient appears to continue to exhibit the 3 areas of ulceration that were noted previous. She did have an x-ray of the right pelvis which showed evidence of potential soft tissue infection but no obvious osteomyelitis. There was a discussion last office visit concerning the possibility of a wound VAC. Witth that being said the x-ray report suggested that an MRI may be more appropriate to further evaluate the extent. Subsequently in regard to the wound over the popliteal portion of the left lower extremity with tunneling at 12:00 the CT scan that was ordered was denied by insurance as they state the patient has not had x-rays prior to advanced imaging. Patient states that she is  frustrated with the situation overall. 10/05/16 in the interval since I last saw this patient last week she has had the x-ray of the knee performed. I did review that x-ray today and fortunately shows no evidence of osteomyelitis or other acute abnormality at this point in time. She continues to have the opening iin the posterior left popliteal space with tracking proximal up the posterior thigh. Nothing seems to have worsened but it also seems to have not improved. The same is true in regard to the right pressure ulcer over the gluteal region which extends toward the ischium. The left gluteal pressure ulcer actually appears to be doing somewhat better my opinion there is some necrotic slough but overall this appears fairly well. She tells me thatt she has some discomfort especially when home health is helping her with dressing changes as they do not know her. At worse she rates her pain to be a 5 out of 10 right now it's more like a 1 out of 10. 10/07/16; still the patient has 3 different wound areas. She has a deep stage IV wound over her right ischial tuberosity. She is due to have an MRI next week. The wound over her left ischial tuberosity is more superficial and underwent debridement today. Finally she has a small open area in her left popliteal fossa the probes on measurably forward superiorly. Still a lot of drainage coming out of this. The CT scan that I ordered 3 weeks ago was questioned by her insurance company wanting a plain x-ray first. As I understand things result of this is nothing has been done in 3 weeks in terms of imaging the thigh and she has an MRI booked of this along with her pelvis for next week line 10/12/16; patient has a deep probing wound over the right ischiall tuberosity, stage III wound over the left visual tuberosity and a draining sinus in her left popliteal fossa. None of this much different from when I saw this 3 weeks ago. We have been using silver rope to  the right ischial wound and a draining area in the left popliteal fossa. Plain silver alginate to the area on the left ischial tuberosity 10/19/16; the patient's wounds are essentially unchanged although the area on the left lower gluteal is actually improved. Our intake nurse noted drainage from the right initial tuberosity probing wound as well as the draining area in the left popliteal fossa. Both of these were cultured. She had x-rays I think at the insistence of her insurance company on 09/23/16 x-ray of the pelvis was  not particularly helpful she did have soft tissue air over the right lower pelvis although with the depth of this wound this is not surprising. An x-ray of her left knee did not show any specific abnormalities. We are still using silver alginate to these wound areas. Her MRI is booked for 10/27 10/26/16; cultures of the purulent drainage in her right initial tuberosity wound grew moderate Proteus and [name], [name] ([id number]) few staph aureus. The same organisms were cultured out of the left knee sinus tract posteriorly. The staph aureus is MRSA. I had started her on Augmentin last week I added doxycycline. The MRI of the left lower extremity and pelvis was finally done. The MRI of the femur showed subcutaneous soft tissue swelling edema fluid and myositis in the vastus lateralis muscle but no soft tissue abscess septic arthritis or osteomyelitis. MRI of the pelvis showed the left wound to be more expensive extending down to the bone there was osteomyelitis. Left hamstring tendons were also involved. No septic arthritis involving the hip. The decubitus ulcer on the right side showed no definite osteomyelitis or abscess.. The right hip wound is actually the one the probes 6 cm downward. But the MRI showing infection including osteomyelitis on the left explains the draining sinus in the popliteal fossa on the left. She did have antibiotics in the hospitalization last time and  this extended into her nursing home stay but I'm not exactly sure what antibiotics and for what duration. According the patient this did include vancomycin with considerable effort of our staff we are able to get the patient into see Dr. Sampson Goon today. There were transportation difficulties. Her mother had open heart surgery and is in the ICU in [place] therefore her brother was unable to transport. Dr. Jarrett Ables office graciously arranged time to see her today. From my point of view she is going to require IV vancomycin plus perhaps a third generation cephalosporin. I plan to keep her on doxycycline and Augmentin until the IV antibiotics can be arranged. 11/02/2016 - Arya presents today for management of ulcers; She saw Dr. 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 She is to have the PICC line placed today and receive the initial dose for both antibiotics today. She has yet to receive the offloading chair cushion and/or mattress overlay from home health, apparently this has been a 3 week process. I encouraged her to speak to home health regarding this matter, along with offering home health to contact the wouns care center with any questions or concerns. The left ischial pressure ulcer continues to imporve, while to right ischial ulcer has increased in depth. The popliteal fossa sinus tract remains unmeasurable due to the limitation of depth measurement (tract extends beyond our measuring devices). 11-16-2016 Ms. Carbonell presents today for evaluation and management of bilateral ischial stage IV pressure ulcers and sinus tract to the left popliteal fossa. she is under the care of Dr. Sampson Goon for IV antibiotic therapy; she states that the vancomycin was placed on hold and will be restarted at a lower dose based on her renal function. She continues taking Zosyn in addition to the vancomycin. She  also states that she has yet to receive offloading cushions from home health, according to her she does not qualify for these offloading cushions because "the ulcers are unstageable ". We will contact the home health agency today to lend clarity regarding her pressure ulcers. The left popliteal fossa  sinus tract continues to be a measurable as it extends beyond the length of our measuring devices.. 11/30/16; the patient is still on vancomycin and Zosyn. The depth of the draining sinus behind her left popliteal fossa is down to 4 cm although there is still serosanguineous drainage coming out of this. She saw Dr. Sampson Goon of infectious disease yesterday the idea is to weeks more of IV antibiotics and then oral antibiotics although I have not read his note. The area on the left gluteal fold is just about healed. She has a 6 cm draining sinus over the right initial tuberosity although I cannot feel bone at the base of this. As far as the patient is aware she has not had a recheck of her inflammatory markers. 12/07/16; patient is on vancomycin and Zosyn appointment with Dr. Sampson Goon on the 19th at which point the patient expects to have a change in antibiotics. Remarkable improvement over the wound over the left ischial tuberosity which is just about closed. The draining sinus in her popliteal fossa has 0.4 cm in depth. The area on the right ischial tuberosity still probes down 7 cm. This is closed and overall wound dimensions but not depth. 12/14/16; patient is completing her vancomycin and Zosyn and per her she is going to transition to Bactrim and Augmentin for another 3 weeks. The area on her left gluteal fold is closed except for some skin tears. The area behind her left knee is no longer has any depth. The only remaining area that is of clinical concern is on the right gluteal fold probing towards the right ischial tuberosity. Today this measures 6.9 cm in depth. [name], [name]  ([id number]) Very gritty surface 12/21/16; patient is now on Bactrim and Augmentin as directed by infectious disease. This should be for another 2 weeks. The area in her left gluteal fold and left popliteal are closed over and fully healed. Measurements today at 7 cm in the right buttock wound is unchanged from last week. 12/27/16; patient is on Bactrim and Augmentin for another week as directed by infectious disease. She has completed her IV antibiotics. She is not been systemically unwell no fever no chills. The area on her right buttock measured over 6 cm in depth. There is no palpable bone. No evidence of surrounding soft tissue infection. She is complaining of tongue irritation and has a history of thrush 01/04/17; patient is been back to see Dr. Sampson Goon, her Augmentin was stopped but he continued the Bactrim for another 3 weeks. Depth of the wound is 6.7 cm there is been no major change in either direction. She is not receive the wound VAC from home health I think because of confusion about who is supposed to provided will actually talk to the [facility] agency [facility] The net no major change. 01/18/17; patient obtained her wound VAC about 10 days ago however for some reason it was not actually put on the wound. She is therefore here for [place] to apply this I guess. No other issues are noted. She is not complaining of pain fever drainage 02/01/17; patient is here now having the wound VAC or 3-4 weeks to a deep pressure area over the right initial tuberosity. This measures 6 cm in depth today which is about half a centimeter better than 2 weeks ago. There is no evidence she is systemically unwell no fever no chills no pain around the area. 02/15/17; I follow this patient every 2 weeks for a deep area over the  right ischial tuberosity. This measures 5.5 cm today which is a continued improvement of 1.2 cm from 1/10 and down 0.5 cm from her visit 2 weeks ago 03/01/17; continued difficult area  over the right initial tuberosity using the wound VAC. Depth today of 5.1 cm which is improved. Does not appear to be a lot of drainage in the canister. Antibiotics were finally stopped by Dr. Sampson Goon bactrim[]  and inflammatory markers have been repeated 03/15/17; fall this lady every 2 weeks for a difficult area over her lower right gluteal area/ischial tuberosity. Depth today at 4.6 cm. This is a slow but steady improvement in the depth of this wound. Although we have labeled this as a pressure ulcer there may have been an underlying infection here at one point before we saw her. She had osteomyelitis on the left extensively which is since resolved 03/29/17- patient is here for follow-up evaluation of her right ischial pressure ulcer. She continues with the wound VAC and home health. According to the nurse home health has been using less foams and appropriate and we will instruct accordingly. The patient continues to smoke, approximately 10 cigarettes a day. She has been advised to decrease that in half by her next appointment, with a goal of complete cessation. She states her blood sugars have been consistently less than 150. She states that she spends most of her day position left lateral or prone. She does have an air mattress on her bed, she does not have an mattress for her chair, she states she cannot afford this. 04/12/17; patient is here for evaluation of her right ischial pressure ulcer. We continue to use a wound VAC with minimal improvement today the depth of this measuring 4.4 cm versus 4.6 cm 2 weeks ago. We are using a [employer] wound VAC on this area. There is not excessive drainage no pain. The patient tells me she tries to keep off this in bed but is up in the wheelchair for 2 hours a day. She is limited in no her overall ambulation but is improving and apparently is getting bilateral lower extremity braces which she hopes will improve her ability to walk independently. 05/10/17; Depth  at 3.3cm. Improved Objective Goetting, Farren L. ([id number]) Constitutional Sitting or standing Blood Pressure is within target range for patient.. Pulse regular and within target range for patient.Marland Kitchen Respirations regular, non-labored and within target range.. Temperature is normal and within the target range for the patient.. Patient's appearance is neat and clean. Appears in no acute distress. Well nourished and well developed.. Vitals Time Taken: 8:05 AM, Height: 63 in, Weight: 257 lbs, BMI: 45.5, Temperature: 97.6 F, Pulse: 87 bpm, Respiratory Rate: 18 breaths/min, Blood Pressure: 134/68 mmHg. General Notes: wound exam; 3.3cm of depth. Debrided with an open curette necrotic surface material which cannot be really visualized. Some surrounding skin erythema which is itchy. oconctact vs candida/ tinea Integumentary (Hair, Skin) Wound #3 status is Open. Original cause of wound was Pressure Injury. The wound is located on the Right Gluteal fold. The wound measures 1cm length x 1.5cm width x 3.3cm depth; 1.178cm^2 area and 3.888cm^3 volume. There is Fat Layer (Subcutaneous Tissue) Exposed exposed. There is no tunneling or undermining noted. There is a large amount of serous drainage noted. The wound margin is distinct with the outline attached to the wound base. There is medium (34-66%) red granulation within the wound bed. There is a medium (34-66%) amount of necrotic tissue within the wound bed including Adherent Slough. The periwound skin appearance  exhibited: Scarring, Maceration. The periwound skin appearance did not exhibit: Callus, Crepitus, Excoriation, Induration, Rash, Dry/Scaly, Atrophie Blanche, Cyanosis, Ecchymosis, Hemosiderin Staining, Mottled, Pallor, Rubor, Erythema. Periwound temperature was noted as No Abnormality. The periwound has tenderness on palpation. Assessment Active Problems ICD-10 L89.314 - Pressure ulcer of right buttock, stage 4 E11.42 - Type 2 diabetes mellitus  with diabetic polyneuropathy Procedures Wound #3 Wound #3 is a Pressure Ulcer located on the Right Gluteal fold . There was a Skin/Subcutaneous Tissue Debridement ([id number]) debridement with total area of 1.5 sq cm performed by Maxwell Caul, MD. with the following instrument(s): scoop to remove Viable and Non-Viable tissue/material including [name], [name] ([id number]) Exudate, Fibrin/Slough, and Subcutaneous after achieving pain control using Lidocaine 4% Topical Solution. A time out was conducted at 08:20, prior to the start of the procedure. A Minimum amount of bleeding was controlled with Pressure. The procedure was tolerated well with a pain level of 0 throughout and a pain level of 0 following the procedure. Post Debridement Measurements: 1cm length x 1.5cm width x 3.3cm depth; 3.888cm^3 volume. Post debridement Stage noted as Category/Stage IV. Character of Wound/Ulcer Post Debridement requires further debridement. Severity of Tissue Post Debridement is: Fat layer exposed. Post procedure Diagnosis Wound #3: Same as Pre-Procedure Plan Wound Cleansing: Wound #3 Right Gluteal fold: Clean wound with Normal Saline. Cleanse wound with mild soap and water Anesthetic: Wound #3 Right Gluteal fold: Topical Lidocaine 4% cream applied to wound bed prior to debridement - clinic use only Skin Barriers/Peri-Wound Care: Wound #3 Right Gluteal fold: Skin Prep Antifungal powder-Nystatin - Nystatin Powder to skin then gauze under drape of wound vac Primary Wound Dressing: Wound #3 Right Gluteal fold: Dry Gauze - over the nystatin powder Prisma Ag - (silver collagen) moisten with saline place in wound all the way to wound bed under wound vac then place the white foam Dressing Change Frequency: Wound #3 Right Gluteal fold: Three times weekly - HHRN to change MONDAY, and FRIDAY and pt to come to wound clinic on [facility], every other week. Follow-up Appointments: Wound #3 Right Gluteal  fold: Return Appointment in 2 weeks. Off-Loading: Wound #3 Right Gluteal fold: Roho cushion for wheelchair - *********[facility] to order*********** Home Health: Wound #3 Right Gluteal fold: Continue Home Health Visits - [facility] *****Please order [facility] cushion****** Home Health Nurse may visit PRN to address patient s wound care needs. FACE TO FACE ENCOUNTER: MEDICARE and MEDICAID PATIENTS: I certify that this patient is under my care and that I had a face-to-face encounter that meets the physician face-to-face encounter requirements with this patient on this date. The encounter with the patient was in whole or in part for the following MEDICAL CONDITION: (primary reason for Home Healthcare) MEDICAL NECESSITY: I certify, [name], [name] ([id number]) that based on my findings, NURSING services are a medically necessary home health service. HOME BOUND STATUS: I certify that my clinical findings support that this patient is homebound (i.e., Due to illness or injury, pt requires aid of supportive devices such as crutches, cane, wheelchairs, walkers, the use of special transportation or the assistance of another person to leave their place of residence. There is a normal inability to leave the home and doing so requires considerable and taxing effort. Other absences are for medical reasons / religious services and are infrequent or of short duration when for other reasons). If current dressing causes regression in wound condition, may D/C ordered dressing product/s and apply Normal Saline Moist Dressing  daily until next Wound Healing Center / Other MD appointment. Notify Wound Healing Center of regression in wound condition at [phone]. Please direct any NON-WOUND related issues/requests for orders to patient's Primary Care Physician Negative Pressure Wound Therapy: Wound #3 Right Gluteal fold: Wound VAC settings at 125/130 mmHg continuous pressure. Use BLACK/GREEN foam to  wound cavity. Use WHITE foam to fill any tunnel/s and/or undermining. Change VAC dressing 3 X WEEK. Change canister as indicated when full. Nurse may titrate settings and frequency of dressing changes as clinically indicated. - Prisma Ag (silver collagen) moisten with saline place in wound all the way to wound bed under wound vac then white foam Nystatin Powder to skin then gauze under drape of wound vac Home Health Nurse may d/c VAC for s/s of increased infection, significant wound regression, or uncontrolled drainage. Notify Wound Healing Center at [phone]. Number of foam/gauze pieces used in the dressing = Medications-please add to medication list.: Wound #3 Right Gluteal fold: Other: - Vitamin C, Zinc, Multivitamins Nystatin Powder to skin then gauze under drape of wound vac depth is better nystatin powder with vac change see if this helps no change to rx Electronic Signature(s) Signed: 05/11/2017 10:21:25 AM By: Elliot Gurney RN, BSN, Kim RN, BSN Signed: 05/16/2017 4:58:14 AM By: Baltazar Najjar MD Previous Signature: 05/10/2017 4:43:34 PM Version By: Baltazar Najjar MD Entered By: Elliot Gurney, RN, BSN, Kim on 05/11/2017 10:21:25 [name], [name] ([id number]) -------------------------------------------------------------------------------- SuperBill Details Patient Name: [name] Date of Service: 05/10/2017 Medical Record Patient Account Number: [id number] Number: Treating RN: Phillis Haggis 1961-07-21 (56 y.o. 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

## 2017-05-17 ENCOUNTER — Other Ambulatory Visit: Payer: Managed Care, Other (non HMO)

## 2017-05-24 ENCOUNTER — Encounter: Payer: Managed Care, Other (non HMO) | Admitting: Internal Medicine

## 2017-05-24 DIAGNOSIS — L89314 Pressure ulcer of right buttock, stage 4: Secondary | ICD-10-CM | POA: Diagnosis not present

## 2017-05-25 ENCOUNTER — Encounter
Admission: RE | Admit: 2017-05-25 | Discharge: 2017-05-25 | Disposition: A | Discharge status: Home / Self Care | Payer: Managed Care, Other (non HMO) | Source: Ambulatory Visit | Attending: Pain Medicine | Admitting: Pain Medicine

## 2017-05-25 ENCOUNTER — Other Ambulatory Visit
Admission: RE | Admit: 2017-05-25 | Discharge: 2017-05-25 | Disposition: A | Discharge status: Home / Self Care | Payer: Managed Care, Other (non HMO) | Source: Ambulatory Visit | Attending: Pain Medicine | Admitting: Pain Medicine

## 2017-05-25 DIAGNOSIS — G8929 Other chronic pain: Secondary | ICD-10-CM | POA: Diagnosis present

## 2017-05-25 DIAGNOSIS — M79605 Pain in left leg: Secondary | ICD-10-CM | POA: Diagnosis present

## 2017-05-25 DIAGNOSIS — M79604 Pain in right leg: Secondary | ICD-10-CM | POA: Insufficient documentation

## 2017-05-25 DIAGNOSIS — M8660 Other chronic osteomyelitis, unspecified site: Secondary | ICD-10-CM | POA: Diagnosis present

## 2017-05-25 DIAGNOSIS — R600 Localized edema: Secondary | ICD-10-CM | POA: Diagnosis present

## 2017-05-25 DIAGNOSIS — R29898 Other symptoms and signs involving the musculoskeletal system: Secondary | ICD-10-CM | POA: Diagnosis present

## 2017-05-25 MED ORDER — TECHNETIUM TC 99M MEDRONATE IV KIT
25.0000 | PACK | Freq: Once | INTRAVENOUS | Status: AC | PRN
  Administered 2017-05-25: 21.855 via INTRAVENOUS

## 2017-05-25 NOTE — Progress Notes (Signed)
[name] ([id number]) Visit Report for 05/24/2017 Arrival Information Details Patient Name: [name], [name] Date of Service: 05/24/2017 8:00 AM Medical Record Patient Account Number: [id number] Number: Treating RN: Baruch Gouty, RN, BSN, Rita [birth year] (56 y.o. Other Clinician: Date of Birth/Sex: Female) Treating ROBSON, MICHAEL Primary Care Dru Laurel: Lavera Guise Latarshia Jersey/Extender: G Referring Chance Munter: Sallee Lange in Treatment: 73 Visit Information History Since Last Visit All ordered tests and consults were completed: No Patient Arrived: Wheel Chair Added or deleted any medications: No Arrival Time: 08:20 Any new allergies or adverse reactions: No Accompanied By: self Had a fall or experienced change in No activities of daily living that may affect Transfer Assistance: None risk of falls: Patient Identification Verified: Yes Signs or symptoms of abuse/neglect since last No Secondary Verification Process Yes visito Completed: Hospitalized since last visit: No Patient Requires Transmission-Based No Has Dressing in Place as Prescribed: Yes Precautions: Pain Present Now: No Patient Has Alerts: Yes Patient Alerts: DM II Electronic Signature(s) Signed: 05/24/2017 9:20:14 AM By: Regan Lemming BSN, RN Entered By: Regan Lemming on 05/24/2017 09:20:14 [name] ([id number]) -------------------------------------------------------------------------------- Encounter Discharge Information Details Patient Name: [name] Date of Service: 05/24/2017 8:00 AM Medical Record Patient Account Number: [id number] Number: Treating RN: Baruch Gouty, RN, BSN, Rita 20-Aug-1961 (56 y.o. Other Clinician: Date of Birth/Sex: Female) Treating ROBSON, MICHAEL Primary Care Sawyer Kahan: Lavera Guise Corian Handley/Extender: G Referring Jamile Rekowski: Sallee Lange in Treatment: 5 Encounter Discharge Information Items Discharge Pain Level: 0 Discharge Condition: Stable Ambulatory  Status: Wheelchair Discharge Destination: Home Transportation: Private Auto Accompanied By: self Schedule Follow-up Appointment: No Medication Reconciliation completed No and provided to Patient/Care [name]: Patient Clinical Summary of Care: Declined Electronic Signature(s) Signed: 05/24/2017 9:15:57 AM By: Ruthine Dose Entered By: Ruthine Dose on 05/24/2017 09:15:56 [name] ([id number]) -------------------------------------------------------------------------------- Lower Extremity Assessment Details Patient Name: [name] Date of Service: 05/24/2017 8:00 AM Medical Record Patient Account Number: [id number] Number: Treating RN: Baruch Gouty, RN, BSN, Rita 05/03/1961 (56 y.o. Other Clinician: Date of Birth/Sex: Female) Treating ROBSON, MICHAEL Primary Care Qais Jowers: Lavera Guise Maecyn Panning/Extender: G Referring Nancylee Gaines: Sallee Lange in Treatment: 35 Electronic Signature(s) Signed: 05/24/2017 9:20:42 AM By: Regan Lemming BSN, RN Entered By: Regan Lemming on 05/24/2017 09:20:42 [name] ([id number]) -------------------------------------------------------------------------------- Multi Wound Chart Details Patient Name: [name] Date of Service: 05/24/2017 8:00 AM Medical Record Patient Account Number: [id number] Number: Treating RN: Baruch Gouty, RN, BSN, Rita 1961-10-18 (56 y.o. Other Clinician: Date of Birth/Sex: Female) Treating ROBSON, MICHAEL Primary Care Jamone Garrido: Lavera Guise Zared Knoth/Extender: G Referring Geraldine Sandberg: Sallee Lange in Treatment: 35 Vital Signs Height(in): 63 Pulse(bpm): 82 Weight(lbs): 257 Blood Pressure 133/73 (mmHg): Body Mass Index(BMI): 46 Temperature(F): 98.2 Respiratory Rate 18 (breaths/min): Photos: [3:No Photos] [N/A:N/A] Wound Location: [3:Right Gluteal fold] [N/A:N/A] Wounding Event: [3:Pressure Injury] [N/A:N/A] Primary Etiology: [3:Pressure Ulcer] [N/A:N/A] Comorbid History: [3:Asthma,  Hypertension, Type II Diabetes, Neuropathy] [N/A:N/A] Date Acquired: [3:04/21/2016] [N/A:N/A] Weeks of Treatment: [3:35] [N/A:N/A] Wound Status: [3:Open] [N/A:N/A] Measurements L x W x D 1x1.7x3.7 [N/A:N/A] (cm) Area (cm) : [3:1.335] [N/A:N/A] Volume (cm) : [3:4.94] [N/A:N/A] % Reduction in Area: [3:56.50%] [N/A:N/A] % Reduction in Volume: 71.30% [N/A:N/A] Classification: [3:Category/Stage IV] [N/A:N/A] Exudate Amount: [3:Large] [N/A:N/A] Exudate Type: [3:Serous] [N/A:N/A] Exudate Color: [3:amber] [N/A:N/A] Foul Odor After [3:Yes] [N/A:N/A] Cleansing: Odor Anticipated Due to No [N/A:N/A] Product Use: Wound Margin: [3:Distinct, outline attached] [N/A:N/A] Granulation Amount: [3:Medium (34-66%)] [N/A:N/A] Granulation Quality: [3:Red] [N/A:N/A] Necrotic Amount: [3:Medium (34-66%)] [N/A:N/A] Exposed Structures: Fat Layer (Subcutaneous N/A N/A Tissue)  Exposed: Yes Epithelialization: None N/A N/A Debridement: Debridement (76195- N/A N/A 11047) Pre-procedure 08:37 N/A N/A Verification/Time Out Taken: Pain Control: Lidocaine 4% Topical N/A N/A Solution Tissue Debrided: Fibrin/Slough, Fat, N/A N/A Exudates, Subcutaneous Level: Skin/Subcutaneous N/A N/A Tissue Debridement Area (sq 1.7 N/A N/A cm): Instrument: Curette N/A N/A Bleeding: Minimum N/A N/A Hemostasis Achieved: Pressure N/A N/A Procedural Pain: 0 N/A N/A Post Procedural Pain: 0 N/A N/A Debridement Treatment Procedure was tolerated N/A N/A Response: well Post Debridement 1x1.7x3.7 N/A N/A Measurements L x W x D (cm) Post Debridement 4.94 N/A N/A Volume: (cm) Post Debridement Category/Stage IV N/A N/A Stage: Periwound Skin Texture: Scarring: Yes N/A N/A Excoriation: No Induration: No Callus: No Crepitus: No Rash: No Periwound Skin Maceration: Yes N/A N/A Moisture: Dry/Scaly: No Periwound Skin Color: Atrophie Blanche: No N/A N/A Cyanosis: No Ecchymosis: No Erythema: No Hemosiderin Staining:  No Mottled: No Pallor: No Rubor: No Temperature: No Abnormality N/A N/A Tenderness on Yes N/A N/A Palpation: Wound Preparation: N/A N/A Doukas, Nykerria L. ([id number]) Ulcer Cleansing: Rinsed/Irrigated with Saline Topical Anesthetic Applied: Other: lidocaine 4% Procedures Performed: Debridement N/A N/A Treatment Notes Wound #3 (Right Gluteal fold) 1. Cleansed with: Clean wound with Normal Saline 3. Peri-wound Care: Skin Prep 4. Dressing Applied: Prisma Ag 8. Negative Pressure Wound Therapy Wound Vac to wound continuously at 171m/hg pressure Black Foam White Foam Notes 1 PIECE BLACK FOAM TO BRIDGE I piece of white foam Electronic Signature(s) Signed: 05/24/2017 9:21:41 AM By: ARegan LemmingBSN, RN Entered By: ARegan Lemmingon 05/24/2017 09:21:40 Mcculloh, TGeorgia [name]([id number] -------------------------------------------------------------------------------- Multi-Disciplinary Care Plan Details Patient Name: WBenedetto GoadDate of Service: 05/24/2017 8:00 AM Medical Record Patient Account Number: [id number]: Treating RN: ABaruch Gouty RN, BSN, Rita [birth year](56y.o. Other Clinician: Date of Birth/Sex: Female) Treating ROBSON, MICHAEL Primary Care Melondy Blanchard: BLavera GuiseProvider/Extender: G Referring Rayford Williamsen: BSallee Langein Treatment: 35 Active Inactive ` Abuse / Safety / Falls / Self Care Management Nursing Diagnoses: Potential for falls Goals: Patient will remain injury free Date Initiated: 09/21/2016 Target Resolution Date: 03/25/2017 Goal Status: Active Interventions: Assess fall risk on admission and as needed Notes: ` Nutrition Nursing Diagnoses: Imbalanced nutrition Potential for alteratiion in Nutrition/Potential for imbalanced nutrition Goals: Patient/caregiver agrees to and verbalizes understanding of need to use nutritional supplements and/or vitamins as prescribed Date Initiated: 09/21/2016 Target Resolution Date: 03/25/2017 Goal Status:  Active Patient/caregiver verbalizes understanding of need to maintain therapeutic glucose control per primary care physician Date Initiated: 09/21/2016 Target Resolution Date: 03/25/2017 Goal Status: Active Interventions: Assess patient nutrition upon admission and as needed per policy Crabbe, TCatron ([id number] Notes: ` Orientation to the Wound Care Program Nursing Diagnoses: Knowledge deficit related to the wound healing center program Goals: Patient/caregiver will verbalize understanding of the WTahomaDate Initiated: 09/21/2016 Target Resolution Date: 03/25/2017 Goal Status: Active Interventions: Provide education on orientation to the wound center Notes: ` Pain, Acute or Chronic Nursing Diagnoses: Pain, acute or chronic: actual or potential Potential alteration in comfort, pain Goals: Patient will verbalize adequate pain control and receive pain control interventions during procedures as needed Date Initiated: 09/21/2016 Target Resolution Date: 03/25/2017 Goal Status: Active Patient/caregiver will verbalize adequate pain control between visits Date Initiated: 09/21/2016 Target Resolution Date: 03/25/2017 Goal Status: Active Patient/caregiver will verbalize comfort level met Date Initiated: 09/21/2016 Target Resolution Date: 03/25/2017 Goal Status: Active Interventions: Assess comfort goal upon admission Complete pain assessment as per visit requirements Notes: ` Wound/Skin Impairment Nursing Diagnoses: WLESLEYANNE, POLITTE ([id number]  Impaired tissue integrity Goals: Ulcer/skin breakdown will have a volume reduction of 30% by week 4 Date Initiated: 09/21/2016 Target Resolution Date: 03/25/2017 Goal Status: Active Ulcer/skin breakdown will have a volume reduction of 50% by week 8 Date Initiated: 09/21/2016 Target Resolution Date: 03/25/2017 Goal Status: Active Ulcer/skin breakdown will have a volume reduction of 80% by week 12 Date Initiated:  09/21/2016 Target Resolution Date: 03/25/2017 Goal Status: Active Interventions: Assess ulceration(s) every visit Notes: Electronic Signature(s) Signed: 05/24/2017 9:21:32 AM By: Regan Lemming BSN, RN Entered By: Regan Lemming on 05/24/2017 09:21:31 Montanari, Lasheba Carlean Jews ([id number]) -------------------------------------------------------------------------------- Pain Assessment Details Patient Name: [name] Date of Service: 05/24/2017 8:00 AM Medical Record Patient Account Number: [id number] Number: Treating RN: Baruch Gouty, RN, BSN, Rita [birth year] (56 y.o. Other Clinician: Date of Birth/Sex: Female) Treating ROBSON, MICHAEL Primary Care Andersen Mckiver: Lavera Guise Christin Mccreedy/Extender: G Referring Forrest Jaroszewski: Sallee Lange in Treatment: 35 Active Problems Location of Pain Severity and Description of Pain Patient Has Paino No Site Locations With Dressing Change: No Pain Management and Medication Current Pain Management: Electronic Signature(s) Signed: 05/24/2017 9:20:19 AM By: Regan Lemming BSN, RN Entered By: Regan Lemming on 05/24/2017 09:20:19 Huxtable, [name] ([id number]) -------------------------------------------------------------------------------- Patient/Caregiver Education Details Patient Name: [name] Date of Service: 05/24/2017 8:00 AM Medical Record Patient Account Number: [id number] Number: Treating RN: Baruch Gouty, RN, BSN, Rita 09/21/1961 (56 y.o. Other Clinician: Date of Birth/Gender: Female) Treating ROBSON, MICHAEL Primary Care Physician: Lavera Guise Physician/Extender: G Referring Physician: Sallee Lange in Treatment: 27 Education Assessment Education Provided To: Patient [facility] Education Topics Provided Welcome To [facility]: Methods: Explain/Verbal Responses: State content correctly Wound Debridement: Methods: Explain/Verbal Responses: State content correctly Wound/Skin Impairment: Methods: Explain/Verbal Responses:  State content correctly Electronic Signature(s) Signed: 05/24/2017 5:20:40 PM By: Regan Lemming BSN, RN Entered By: Regan Lemming on 05/24/2017 09:11:33 Delancy, [name] ([id number]) -------------------------------------------------------------------------------- Wound Assessment Details Patient Name: [name] Date of Service: 05/24/2017 8:00 AM Medical Record Patient Account Number: [id number] Number: Treating RN: Baruch Gouty, RN, BSN, Rita [birth year] (56 y.o. Other Clinician: Date of Birth/Sex: Female) Treating ROBSON, MICHAEL Primary Care Gor Vestal: Lavera Guise Ryen Heitmeyer/Extender: G Referring Dale Ribeiro: Sallee Lange in Treatment: 35 Wound Status Wound Number: 3 Primary Pressure Ulcer Etiology: Wound Location: Right Gluteal fold Wound Status: Open Wounding Event: Pressure Injury Comorbid Asthma, Hypertension, Type II Date Acquired: 04/21/2016 History: Diabetes, Neuropathy Weeks Of Treatment: 35 Clustered Wound: No Wound Measurements Length: (cm) 1 Width: (cm) 1.7 Depth: (cm) 3.7 Area: (cm) 1.335 Volume: (cm) 4.94 % Reduction in Area: 56.5% % Reduction in Volume: 71.3% Epithelialization: None Tunneling: No Undermining: No Wound Description Classification: Category/Stage IV Wound Margin: Distinct, outline attached Exudate Amount: Large Exudate Type: Serous Exudate Color: amber Foul Odor After Cleansing: Yes Due to Product Use: No Slough/Fibrino Yes Wound Bed Granulation Amount: Medium (34-66%) Exposed Structure Granulation Quality: Red Fat Layer (Subcutaneous Tissue) Exposed: Yes Necrotic Amount: Medium (34-66%) Necrotic Quality: Adherent Slough Periwound Skin Texture Texture Color No Abnormalities Noted: No No Abnormalities Noted: No Callus: No Atrophie Blanche: No Crepitus: No Cyanosis: No Excoriation: No Ecchymosis: No Induration: No Erythema: No Rash: No Hemosiderin Staining: No Scarring: Yes Mottled: No Bacchi, Marnae L.  ([id number]) Moisture Pallor: No No Abnormalities Noted: No Rubor: No Dry / Scaly: No Temperature / Pain Maceration: Yes Temperature: No Abnormality Tenderness on Palpation: Yes Wound Preparation Ulcer Cleansing: Rinsed/Irrigated with Saline Topical Anesthetic Applied: Other: lidocaine 4%, Treatment Notes Wound #3 (Right Gluteal fold) 1. Cleansed with: Clean wound  with Normal Saline 3. Peri-wound Care: Skin Prep 4. Dressing Applied: Prisma Ag 8. Negative Pressure Wound Therapy Wound Vac to wound continuously at 18m/hg pressure Black Foam White Foam Notes 1 PIECE BLACK FOAM TO BRIDGE I piece of white foam Electronic Signature(s) Signed: 05/24/2017 5:20:40 PM By: ARegan LemmingBSN, RN Entered By: ARegan Lemmingon 05/24/2017 08:28:42 Crigler, TGeorgia [name]([id number] -------------------------------------------------------------------------------- Vitals Details Patient Name: WBenedetto GoadDate of Service: 05/24/2017 8:00 AM Medical Record Patient Account Number: [id number]: Treating RN: ABaruch Gouty RN, BSN, Rita [birth year](56y.o. Other Clinician: Date of Birth/Sex: Female) Treating ROBSON, MICHAEL Primary Care Bich Mchaney: BLavera GuiseProvider/Extender: G Referring Emberlyn Burlison: BSallee Langein Treatment: 35 Vital Signs Time Taken: 08:23 Temperature (F): 98.2 Height (in): 63 Pulse (bpm): 82 Weight (lbs): 257 Respiratory Rate (breaths/min): 18 Body Mass Index (BMI): 45.5 Blood Pressure (mmHg): 133/73 Reference Range: 80 - 120 mg / dl Electronic Signature(s) Signed: 05/24/2017 9:20:24 AM By: ARegan LemmingBSN, RN Entered By: ARegan Lemmingon 05/24/2017 09:20:23

## 2017-05-31 NOTE — Progress Notes (Signed)
Results were reviewed and found to be: abnormal  No acute injury or pathology identified  Review would suggest interventional pain management techniques may be of benefit

## 2017-05-31 NOTE — Progress Notes (Signed)
Katelyn, [name] ([id number]) Visit Report for 05/24/2017 Chief Complaint Document Details Patient Name: Katelyn, [name] Date of Service: 05/24/2017 8:00 AM Medical Record Patient Account Number: [id number] Number: Treating RN: Phillis Haggis 02-12-1961 (56 y.o. Other Clinician: Date of Birth/Sex: Female) Treating ROBSON, MICHAEL Primary Care Provider: Joen [name] Provider/Extender: G Referring Provider: Tedra Senegal in Treatment: 35 Information Obtained from: Patient Chief Complaint Patient is here for follow-up of a relation of her right ischial pressure ulcer Electronic Signature(s) Signed: 05/24/2017 4:44:14 PM By: Baltazar Najjar MD Entered By: Baltazar Najjar on 05/24/2017 08:42:58 [name], [name] -------------------------------------------------------------------------------- Debridement Details Patient Name: [name] Date of Service: 05/24/2017 8:00 AM Medical Record Patient Account Number: [id number] Number: Treating RN: Clover Mealy, RN, BSN, Rita 28-Aug-1961 (56 y.o. Other Clinician: Date of Birth/Sex: Female) Treating ROBSON, MICHAEL Primary Care Provider: Joen [name] Provider/Extender: G Referring Provider: Tedra Senegal in Treatment: 35 Debridement Performed for Wound #3 Right Gluteal fold Assessment: Performed By: Clinician Clover Mealy, RN, BSN, [place]ink, RN Debridement: Debridement Pre-procedure Verification/Time Out Yes - 08:37 Taken: Start Time: 08:37 Pain Control: Lidocaine 4% Topical Solution Level: Skin/Subcutaneous Tissue Total Area Debrided (L x 1 (cm) x 1.7 (cm) = 1.7 (cm) W): Tissue and other Exudate, Fat, Fibrin/Slough, Subcutaneous material debrided: Instrument: Curette Bleeding: Minimum Hemostasis Achieved: Pressure End Time: 08:41 Procedural Pain: 0 Post Procedural Pain: 0 Response to Treatment: Procedure was tolerated well Post Debridement Measurements of Total Wound Length: (cm) 1 Stage:  Category/Stage IV Width: (cm) 1.7 Depth: (cm) 3.7 Volume: (cm) 4.94 Character of Wound/Ulcer Post Stable Debridement: Post Procedure Diagnosis Same as Pre-procedure Electronic Signature(s) Signed: 05/26/2017 4:24:36 PM By: Elpidio Eric BSN, RN Signed: 05/30/2017 5:27:39 PM By: Baltazar Najjar MD Previous Signature: 05/24/2017 4:44:14 PM Version By: Baltazar Najjar MD [name] ([id number]) Entered By: Elpidio Eric on 05/26/2017 16:24:35 [name], [name] ([id number]) -------------------------------------------------------------------------------- HPI Details Patient Name: [name] Date of Service: 05/24/2017 8:00 AM Medical Record Patient Account Number: [id number] Number: Treating RN: Phillis Haggis 06-16-1961 (56 y.o. Other Clinician: Date of Birth/Sex: Female) Treating ROBSON, MICHAEL Primary Care Provider: Joen [name] Provider/Extender: G Referring Provider: Tedra Senegal in Treatment: 35 History of Present Illness Location: bilateral gluteal ulcerations and left popliteal ulceration with tunneling Quality: adits to intermittent aching to ischial ulcers, no discomfort to sinus tract Severity: right iscial ulcer with increased depth Duration: chronic ulcers to bilateral ischial ulcers and sinus tract Timing: The pain is intermittent in severity as far as how intense it becomes but is present all the time. Manipulationn makes this worse. Context: The wound occurred when the patient had a fall and was unconscious for about 48 hours laying on the floor and she had pressure injury at that stage. Modifying Factors: Other treatment(s) tried include:as noted below she has been seen by visiting wound care physicians or nurse practitioners and details have been noted Associated Signs and Symptoms: has yet to receive offloading cushions and mattress overlay HPI Description: 56 year old patient who was seen by visiting [name] wound care specialist for a wound on both  her buttock and was found to have an unstageable wound on the right buttock for about 2 months. I understand that she had a fall and was laying on the floor for about 48 hours before she was found and taken to the ICU and had a long injury to her gluteal area from pressure and also had broken her right humerus. She has had a right proximal humerus fracture and has  been followed up with orthopedics recently. The patient has a past medical history of type 2 diabetes mellitus, paraparesis, acute pyelonephritis, GERD, hypertension, glaucoma, chronic pain, anxiety neurosis, nicotine dependence, COPD. the patient had some debridement done and was to operative was recommended to use Silvadene dressing and offloading. She is a smoker and occasionally smokes a few cigarettes. the patient requested a second opinion for months and is here to discuss her care. 09/21/16; the patient re-presents from home today for review of 3 different wounds. I note that she was seen in the clinic here in July at which time she had bilateral buttock wounds. It was apparently suggested at that time that she use a wound VAC bridged to both wounds just near the initial tuberosity's bilaterally which she refused. The history was a bit difficult to put together. Apparently this patient became ill at the end of April of this year. She was found sitting on the floor she had apparently been for 2 days and subsequently admitted to hospital from 04/23/16 through 05/02/16 and at that point she was critically ill ultimately having sepsis secondary to UTI, nontraumatic rhabdomyolysis and diabetic ketoacidosis. She had acute renal failure and I think required ICU care including intubation. Patient states her wounds actually started at that point on the bilateral issue tuberosities however in reviewing the discharge summary from 5/8 I see no reference to wounds at that point. It did state that she had left lower extremity cellulitis however.  Reviewing Epic I see no relevant x-rays. It would appear that her discharge creatinine was within the normal range and indeed on 9/15 her creatinine has remained normal. [facility] Katelyn, [name]. ([id number]) rehabilitation. There the wounds on her bilateral Buttocks were dressed. Only just before her discharge from the nursing facility she developed an "knot" which was interpreted as cellulitis on the posterior aspect of her left knee she was given antibiotics. Apparently sometime late in July a this actually opened and became a wound at home health care was tending to however she is still having purulent drainage coming from this and by my understanding the wound depth is actually become unmeasurable. I am not really clear about what home health has been placing in any of these wound areas. The patient states that is something with silver and it. She is not been systemically unwell no fever or chills her appetite is good. She is a diabetic poorly controlled however she states that her recent blood sugars at home have been in the low to mid 100s. 09/28/16 On evaluation today patient appears to continue to exhibit the 3 areas of ulceration that were noted previous. She did have an x-ray of the right pelvis which showed evidence of potential soft tissue infection but no obvious osteomyelitis. There was a discussion last office visit concerning the possibility of a wound VAC. Witth that being said the x-ray report suggested that an MRI may be more appropriate to further evaluate the extent. Subsequently in regard to the wound over the popliteal portion of the left lower extremity with tunneling at 12:00 the CT scan that was ordered was denied by insurance as they state the patient has not had x-rays prior to advanced imaging. Patient states that she is frustrated with the situation overall. 10/05/16 in the interval since I last saw this patient last week  she has had the x-ray of the knee performed. I did review that x-ray today and fortunately shows no evidence  of osteomyelitis or other acute abnormality at this point in time. She continues to have the opening iin the posterior left popliteal space with tracking proximal up the posterior thigh. Nothing seems to have worsened but it also seems to have not improved. The same is true in regard to the right pressure ulcer over the gluteal region which extends toward the ischium. The left gluteal pressure ulcer actually appears to be doing somewhat better my opinion there is some necrotic slough but overall this appears fairly well. She tells me thatt she has some discomfort especially when home health is helping her with dressing changes as they do not know her. At worse she rates her pain to be a 5 out of 10 right now it's more like a 1 out of 10. 10/07/16; still the patient has 3 different wound areas. She has a deep stage IV wound over her right ischial tuberosity. She is due to have an MRI next week. The wound over her left ischial tuberosity is more superficial and underwent debridement today. Finally she has a small open area in her left popliteal fossa the probes on measurably forward superiorly. Still a lot of drainage coming out of this. The CT scan that I ordered 3 weeks ago was questioned by her insurance company wanting a plain x-ray first. As I understand things result of this is nothing has been done in 3 weeks in terms of imaging the thigh and she has an MRI booked of this along with her pelvis for next week line 10/12/16; patient has a deep probing wound over the right ischiall tuberosity, stage III wound over the left visual tuberosity and a draining sinus in her left popliteal fossa. None of this much different from when I saw this 3 weeks ago. We have been using silver rope to the right ischial wound and a draining area in the left popliteal fossa. Plain silver alginate to the area  on the left ischial tuberosity 10/19/16; the patient's wounds are essentially unchanged although the area on the left lower gluteal is actually improved. Our intake nurse noted drainage from the right initial tuberosity probing wound as well as the draining area in the left popliteal fossa. Both of these were cultured. She had x-rays I think at the insistence of her insurance company on 09/23/16 x-ray of the pelvis was not particularly helpful she did have soft tissue air over the right lower pelvis although with the depth of this wound this is not surprising. An x-ray of her left knee did not show any specific abnormalities. We are still using silver alginate to these wound areas. Her MRI is booked for 10/27 10/26/16; cultures of the purulent drainage in her right initial tuberosity wound grew moderate Proteus and few staph aureus. The same organisms were cultured out of the left knee sinus tract posteriorly. The staph aureus is MRSA. I had started her on Augmentin last week I added doxycycline. The MRI of the left lower extremity and pelvis was finally done. The MRI of the femur showed subcutaneous soft tissue swelling Hochman, Marilynne L. ([id number]) edema fluid and myositis in the vastus lateralis muscle but no soft tissue abscess septic arthritis or osteomyelitis. MRI of the pelvis showed the left wound to be more expensive extending down to the bone there was osteomyelitis. Left hamstring tendons were also involved. No septic arthritis involving the hip. The decubitus ulcer on the right side showed no definite osteomyelitis or abscess.. The right hip wound is actually the one  the probes 6 cm downward. But the MRI showing infection including osteomyelitis on the left explains the draining sinus in the popliteal fossa on the left. She did have antibiotics in the hospitalization last time and this extended into her nursing home stay but I'm not exactly sure what antibiotics and for what duration.  According the patient this did include vancomycin with considerable effort of our staff we are able to get the patient into see Dr. Sampson Goon today. There were transportation difficulties. Her mother had open heart surgery and is in the ICU in [place] therefore her brother was unable to transport. Dr. Jarrett Ables office graciously arranged time to see her today. From my point of view she is going to require IV vancomycin plus perhaps a third generation cephalosporin. I plan to keep her on doxycycline and Augmentin until the IV antibiotics can be arranged. 11/02/2016 - Emmarose presents today for management of ulcers; She saw Dr. 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 She is to have the PICC line placed today and receive the initial dose for both antibiotics today. She has yet to receive the offloading chair cushion and/or mattress overlay from home health, apparently this has been a 3 week process. I encouraged her to speak to home health regarding this matter, along with offering home health to contact the wouns care center with any questions or concerns. The left ischial pressure ulcer continues to imporve, while to right ischial ulcer has increased in depth. The popliteal fossa sinus tract remains unmeasurable due to the limitation of depth measurement (tract extends beyond our measuring devices). 11-16-2016 Ms. Fredericks presents today for evaluation and management of bilateral ischial stage IV pressure ulcers and sinus tract to the left popliteal fossa. she is under the care of Dr. Sampson Goon for IV antibiotic therapy; she states that the vancomycin was placed on hold and will be restarted at a lower dose based on her renal function. She continues taking Zosyn in addition to the vancomycin. She also states that she has yet to receive offloading cushions from home health, according to her she does  not qualify for these offloading cushions because "the ulcers are unstageable ". We will contact the home health agency today to lend clarity regarding her pressure ulcers. The left popliteal fossa sinus tract continues to be a measurable as it extends beyond the length of our measuring devices.. 11/30/16; the patient is still on vancomycin and Zosyn. The depth of the draining sinus behind her left popliteal fossa is down to 4 cm although there is still serosanguineous drainage coming out of this. She saw Dr. Sampson Goon of infectious disease yesterday the idea is to weeks more of IV antibiotics and then oral antibiotics although I have not read his note. The area on the left gluteal fold is just about healed. She has a 6 cm draining sinus over the right initial tuberosity although I cannot feel bone at the base of this. As far as the patient is aware she has not had a recheck of her inflammatory markers. 12/07/16; patient is on vancomycin and Zosyn appointment with Dr. Sampson Goon on the 19th at which point the patient expects to have a change in antibiotics. Remarkable improvement over the wound over the left ischial tuberosity which is just about closed. The draining sinus in her popliteal fossa has 0.4 cm in depth. The area on the right ischial tuberosity still probes down 7 cm. This is closed  and overall wound dimensions but not depth. 12/14/16; patient is completing her vancomycin and Zosyn and per her she is going to transition to Bactrim and Augmentin for another 3 weeks. The area on her left gluteal fold is closed except for some skin tears. The area behind her left knee is no longer has any depth. The only remaining area that is of clinical concern is on the right gluteal fold probing towards the right ischial tuberosity. Today this measures 6.9 cm in depth. Very gritty surface 12/21/16; patient is now on Bactrim and Augmentin as directed by infectious disease. This should be for another  2 weeks. The area in her left gluteal fold and left popliteal are closed over and fully healed. [name], [name] ([id number]) Measurements today at 7 cm in the right buttock wound is unchanged from last week. 12/27/16; patient is on Bactrim and Augmentin for another week as directed by infectious disease. She has completed her IV antibiotics. She is not been systemically unwell no fever no chills. The area on her right buttock measured over 6 cm in depth. There is no palpable bone. No evidence of surrounding soft tissue infection. She is complaining of tongue irritation and has a history of thrush 01/04/17; patient is been back to see Dr. Sampson Goon, her Augmentin was stopped but he continued the Bactrim for another 3 weeks. Depth of the wound is 6.7 cm there is been no major change in either direction. She is not receive the wound VAC from home health I think because of confusion about who is supposed to provided will actually talk to the [facility] agency [facility] The net no major change. 01/18/17; patient obtained her wound VAC about 10 days ago however for some reason it was not actually put on the wound. She is therefore here for [place] to apply this I guess. No other issues are noted. She is not complaining of pain fever drainage 02/01/17; patient is here now having the wound VAC or 3-4 weeks to a deep pressure area over the right initial tuberosity. This measures 6 cm in depth today which is about half a centimeter better than 2 weeks ago. There is no evidence she is systemically unwell no fever no chills no pain around the area. 02/15/17; I follow this patient every 2 weeks for a deep area over the right ischial tuberosity. This measures 5.5 cm today which is a continued improvement of 1.2 cm from 1/10 and down 0.5 cm from her visit 2 weeks ago 03/01/17; continued difficult area over the right initial tuberosity using the wound VAC. Depth today of 5.1 cm which is improved. Does not appear to be  a lot of drainage in the canister. Antibiotics were finally stopped by Dr. Sampson Goon bactrim[]  and inflammatory markers have been repeated 03/15/17; fall this lady every 2 weeks for a difficult area over her lower right gluteal area/ischial tuberosity. Depth today at 4.6 cm. This is a slow but steady improvement in the depth of this wound. Although we have labeled this as a pressure ulcer there may have been an underlying infection here at one point before we saw her. She had osteomyelitis on the left extensively which is since resolved 03/29/17- patient is here for follow-up evaluation of her right ischial pressure ulcer. She continues with the wound VAC and home health. According to the nurse home health has been using less foams and appropriate and we will instruct accordingly. The patient continues to smoke, approximately 10 cigarettes a day. She  has been advised to decrease that in half by her next appointment, with a goal of complete cessation. She states her blood sugars have been consistently less than 150. She states that she spends most of her day position left lateral or prone. She does have an air mattress on her bed, she does not have an mattress for her chair, she states she cannot afford this. 04/12/17; patient is here for evaluation of her right ischial pressure ulcer. We continue to use a wound VAC with minimal improvement today the depth of this measuring 4.4 cm versus 4.6 cm 2 weeks ago. We are using a [employer] wound VAC on this area. There is not excessive drainage no pain. The patient tells me she tries to keep off this in bed but is up in the wheelchair for 2 hours a day. She is limited in no her overall ambulation but is improving and apparently is getting bilateral lower extremity braces which she hopes will improve her ability to walk independently. 05/10/17; Depth at 3.3cm. Improved 05/24/17; depth at 3.5 cm. This is not improved since last time. Not clear if they are using  collagen under the foam Electronic Signature(s) Signed: 05/24/2017 4:44:14 PM By: Baltazar Najjar MD Entered By: Baltazar Najjar on 05/24/2017 08:47:16 [name], [name] ([id number]) -------------------------------------------------------------------------------- Physical Exam Details Patient Name: [name] Date of Service: 05/24/2017 8:00 AM Medical Record Patient Account Number: [id number] Number: Treating RN: Phillis Haggis 08/19/1961 (56 y.o. Other Clinician: Date of Birth/Sex: Female) Treating ROBSON, MICHAEL Primary Care Provider: Joen [name] Provider/Extender: G Referring Provider: Tedra Senegal in Treatment: 35 Constitutional Sitting or standing Blood Pressure is within target range for patient.. Pulse regular and within target range for patient.Marland Kitchen Respirations regular, non-labored and within target range.. Temperature is normal and within the target range for the patient.Marland Kitchen appears in no distress. Eyes . Notes Wound exam; right buttock/right ischial tuberosity area. 3.5 cm of depth this is not improved. Again using an open curet a blind debridement of the surface of this of surface material which is sandpaper consistency. Copious amounts to get down to bleeding tissue. There is no evidence of infection in this area, no erythema around the wound, no soft tissue crepitus, no tenderness Electronic Signature(s) Signed: 05/24/2017 4:44:14 PM By: Baltazar Najjar MD Entered By: Baltazar Najjar on 05/24/2017 08:48:52 [name], [name] ([id number]) -------------------------------------------------------------------------------- Physician Orders Details Patient Name: [name] Date of Service: 05/24/2017 8:00 AM Medical Record Patient Account Number: [id number] Number: Treating RN: Clover Mealy, RN, BSN, Rita 1961/10/05 (56 y.o. Other Clinician: Date of Birth/Sex: Female) Treating ROBSON, MICHAEL Primary Care Provider: Joen [name] Provider/Extender:  G Referring Provider: Tedra Senegal in Treatment: 7 Verbal / Phone Orders: No Diagnosis Coding Wound Cleansing Wound #3 Right Gluteal fold o Clean wound with Normal Saline. o Cleanse wound with mild soap and water Anesthetic Wound #3 Right Gluteal fold o Topical Lidocaine 4% cream applied to wound bed prior to debridement - clinic use only Skin Barriers/Peri-Wound Care Wound #3 Right Gluteal fold o Skin Prep o Antifungal powder-Nystatin - Nystatin Powder to skin then gauze under drape of wound vac Primary Wound Dressing Wound #3 Right Gluteal fold o Dry Gauze - over the nystatin powder o Prisma Ag - (silver collagen) moisten with saline place in wound all the way to wound bed under wound vac then place the white foam Dressing Change Frequency Wound #3 Right Gluteal fold o Three times weekly - HHRN to change MONDAY, and FRIDAY and pt  to come to wound clinic on [facility], every other week. Follow-up Appointments Wound #3 Right Gluteal fold o Return Appointment in 2 weeks. Off-Loading Wound #3 Right Gluteal fold o Roho cushion for wheelchair - *********[facility] to order*********** Artus, [name] ([id number]) Home Health Wound #3 Right Gluteal fold o Continue Home Health Visits - [facility] *****Please order [facility] cushion****** o Home Health Nurse may visit PRN to address patientos wound care needs. o FACE TO FACE ENCOUNTER: MEDICARE and MEDICAID PATIENTS: I certify that this patient is under my care and that I had a face-to-face encounter that meets the physician face-to-face encounter requirements with this patient on this date. The encounter with the patient was in whole or in part for the following MEDICAL CONDITION: (primary reason for Home Healthcare) MEDICAL NECESSITY: I certify, that based on my findings, NURSING services are a medically necessary home health service. HOME BOUND STATUS: I certify that my clinical  findings support that this patient is homebound (i.e., Due to illness or injury, pt requires aid of supportive devices such as crutches, cane, wheelchairs, walkers, the use of special transportation or the assistance of another person to leave their place of residence. There is a normal inability to leave the home and doing so requires considerable and taxing effort. Other absences are for medical reasons / religious services and are infrequent or of short duration when for other reasons). o If current dressing causes regression in wound condition, may D/C ordered dressing product/s and apply Normal Saline Moist Dressing daily until next Wound Healing Center / Other MD appointment. [facility] regression in wound condition at [phone]. o Please direct any NON-WOUND related issues/requests for orders to patient's Primary Care Physician Negative Pressure Wound Therapy Wound #3 Right Gluteal fold o Wound VAC settings at 125/130 mmHg continuous pressure. Use BLACK/GREEN foam to wound cavity. Use WHITE foam to fill any tunnel/s and/or undermining. Change VAC dressing 3 X WEEK. Change canister as indicated when full. Nurse may titrate settings and frequency of dressing changes as clinically indicated. - Prisma Ag (silver collagen) moisten with saline place in wound all the way to wound bed under wound vac then white foam Nystatin Powder to skin then gauze under drape of wound vac o Home Health Nurse may d/c VAC for s/s of increased infection, significant wound regression, or uncontrolled drainage. [facility] at [phone]. o Number of foam/gauze pieces used in the dressing = Medications-please add to medication list. Wound #3 Right Gluteal fold o Other: - Vitamin C, Zinc, Multivitamins Nystatin Powder to skin then gauze under drape of wound vac Electronic Signature(s) Signed: 05/24/2017 4:44:14 PM By: Baltazar Najjar MD Signed: 05/24/2017  5:20:40 PM By: Elpidio Eric BSN, RN Entered By: Elpidio Eric on 05/24/2017 09:08:55 [name], [name] ([id number]) -------------------------------------------------------------------------------- Problem List Details Patient Name: [name] Date of Service: 05/24/2017 8:00 AM Medical Record Patient Account Number: [id number] Number: Treating RN: Phillis Haggis 1961/01/14 (56 y.o. 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 09/21/2016 09/21/2016 severity Resolved Problems ICD-10 Code Description Active  Date Resolved Date L89.323 Pressure ulcer of left buttock, stage 3 09/21/2016 09/21/2016 L89.324 Pressure ulcer of left buttock, stage 4 11/16/2016 11/16/2016 Electronic Signature(s) Signed: 05/24/2017 4:44:14 PM By: Baltazar Najjar MD [name], [name] Entered By: Baltazar Najjar on 05/24/2017 08:42:30 [name], [name] ([id number]) -------------------------------------------------------------------------------- Progress Note Details Patient Name: [name] Date of Service: 05/24/2017 8:00 AM Medical Record Patient Account Number: [id number] Number: Treating RN: Phillis Haggis [birth year] (56 y.o. Other Clinician: Date of Birth/Sex: Female) Treating ROBSON, MICHAEL Primary Care Provider: Joen [name] Provider/Extender: G Referring Provider: Tedra [place] in Treatment: 35 Subjective Chief  Complaint Information obtained from Patient Patient is here for follow-up of a relation of her right ischial pressure ulcer History of Present Illness (HPI) The following HPI elements were documented for the patient's wound: Location: bilateral gluteal ulcerations and left popliteal ulceration with tunneling Quality: adits to intermittent aching to ischial ulcers, no discomfort to sinus tract Severity: right iscial ulcer with increased depth Duration: chronic ulcers to bilateral ischial ulcers and sinus tract Timing: The pain is intermittent in severity as far as how intense it becomes but is present all the time. Manipulationn makes this worse. Context: The wound occurred when the patient had a fall and was unconscious for about 48 hours laying on the floor and she had pressure injury at that stage. Modifying Factors: Other treatment(s) tried include:as noted below she has been seen by visiting wound care physicians or nurse practitioners and details have been noted Associated Signs and Symptoms: has yet to receive offloading cushions and mattress overlay 56 year old patient who was seen by visiting [name] wound care specialist for a wound on both her buttock and was found to have an unstageable wound on the right buttock for about 2 months. I understand that she had a fall and was laying on the floor for about 48 hours before she was found and taken to the ICU and had a long injury to her gluteal area from pressure and also had broken her right humerus. She has had a right proximal humerus fracture and has been followed up with orthopedics recently. The patient has a past medical history of type 2 diabetes mellitus, paraparesis, acute pyelonephritis, GERD, hypertension, glaucoma, chronic pain, anxiety neurosis, nicotine dependence, COPD. the patient had some debridement done and was to operative was recommended to use Silvadene dressing and offloading. She is a smoker and occasionally  smokes a few cigarettes. the patient requested a second opinion for months and is here to discuss her care. 09/21/16; the patient re-presents from home today for review of 3 different wounds. I note that she was seen in the clinic here in July at which time she had bilateral buttock wounds. It was apparently suggested at that time that she use a wound VAC bridged to both wounds just near the initial tuberosity's bilaterally which she refused. The history was a bit difficult to put together. Apparently this patient became ill at the end of April of this RAILEIGH, SABATER. ([id number]) year. She was found sitting on the floor she had apparently been for 2 days and subsequently admitted to hospital from 04/23/16 through 05/02/16 and at that point she was critically ill ultimately having sepsis secondary to UTI, nontraumatic rhabdomyolysis and diabetic ketoacidosis. She had acute renal failure and I think required ICU care including intubation. Patient states her wounds actually started at that point on the bilateral issue tuberosities however in reviewing the discharge summary from 5/8 I see no reference to  wounds at that point. It did state that she had left lower extremity cellulitis however. Reviewing Epic I see no relevant x-rays. It would appear that her discharge creatinine was within the normal range and indeed on 9/15 her creatinine has remained normal. [facility] There the wounds on her bilateral Buttocks were dressed. Only just before her discharge from the nursing facility she developed an "knot" which was interpreted as cellulitis on the posterior aspect of her left knee she was given antibiotics. Apparently sometime late in July a this actually opened and became a wound at home health care was tending to however she is still having purulent drainage coming from this and by my understanding the wound depth is actually become  unmeasurable. I am not really clear about what home health has been placing in any of these wound areas. The patient states that is something with silver and it. She is not been systemically unwell no fever or chills her appetite is good. She is a diabetic poorly controlled however she states that her recent blood sugars at home have been in the low to mid 100s. 09/28/16 On evaluation today patient appears to continue to exhibit the 3 areas of ulceration that were noted previous. She did have an x-ray of the right pelvis which showed evidence of potential soft tissue infection but no obvious osteomyelitis. There was a discussion last office visit concerning the possibility of a wound VAC. Witth that being said the x-ray report suggested that an MRI may be more appropriate to further evaluate the extent. Subsequently in regard to the wound over the popliteal portion of the left lower extremity with tunneling at 12:00 the CT scan that was ordered was denied by insurance as they state the patient has not had x-rays prior to advanced imaging. Patient states that she is frustrated with the situation overall. 10/05/16 in the interval since I last saw this patient last week she has had the x-ray of the knee performed. I did review that x-ray today and fortunately shows no evidence of osteomyelitis or other acute abnormality at this point in time. She continues to have the opening iin the posterior left popliteal space with tracking proximal up the posterior thigh. Nothing seems to have worsened but it also seems to have not improved. The same is true in regard to the right pressure ulcer over the gluteal region which extends toward the ischium. The left gluteal pressure ulcer actually appears to be doing somewhat better my opinion there is some necrotic slough but overall this appears fairly well. She tells me thatt she has some discomfort especially when home health is helping her with dressing changes  as they do not know her. At worse she rates her pain to be a 5 out of 10 right now it's more like a 1 out of 10. 10/07/16; still the patient has 3 different wound areas. She has a deep stage IV wound over her right ischial tuberosity. She is due to have an MRI next week. The wound over her left ischial tuberosity is more superficial and underwent debridement today. Finally she has a small open area in her left popliteal fossa the probes on measurably forward superiorly. Still a lot of drainage coming out of this. The CT scan that I ordered 3 weeks ago was questioned by her insurance company wanting a plain x-ray first. As I understand things result of this is nothing has been done in 3 weeks  in terms of imaging the thigh and she has an MRI booked of this along with her pelvis for next week line 10/12/16; patient has a deep probing wound over the right ischiall tuberosity, stage III wound over the left visual tuberosity and a draining sinus in her left popliteal fossa. None of this much different from when I saw this 3 weeks ago. We have been using silver rope to the right ischial wound and a draining area in the left popliteal fossa. Plain silver alginate to the area on the left ischial tuberosity 10/19/16; the patient's wounds are essentially unchanged although the area on the left lower gluteal is actually improved. Our intake nurse noted drainage from the right initial tuberosity probing wound as well as the draining area in the left popliteal fossa. Both of these were cultured. She had x-rays I think at the [place], [place]ER [place]. ([id number]) insistence of her insurance company on 09/23/16 x-ray of the pelvis was not particularly helpful she did have soft tissue air over the right lower pelvis although with the depth of this wound this is not surprising. An x-ray of her left knee did not show any specific abnormalities. We are still using silver alginate to these wound areas. Her MRI is booked for  10/27 10/26/16; cultures of the purulent drainage in her right initial tuberosity wound grew moderate Proteus and few staph aureus. The same organisms were cultured out of the left knee sinus tract posteriorly. The staph aureus is MRSA. I had started her on Augmentin last week I added doxycycline. The MRI of the left lower extremity and pelvis was finally done. The MRI of the femur showed subcutaneous soft tissue swelling edema fluid and myositis in the vastus lateralis muscle but no soft tissue abscess septic arthritis or osteomyelitis. MRI of the pelvis showed the left wound to be more expensive extending down to the bone there was osteomyelitis. Left hamstring tendons were also involved. No septic arthritis involving the hip. The decubitus ulcer on the right side showed no definite osteomyelitis or abscess.. The right hip wound is actually the one the probes 6 cm downward. But the MRI showing infection including osteomyelitis on the left explains the draining sinus in the popliteal fossa on the left. She did have antibiotics in the hospitalization last time and this extended into her nursing home stay but I'm not exactly sure what antibiotics and for what duration. According the patient this did include vancomycin with considerable effort of our staff we are able to get the patient into see Dr. Sampson Goon today. There were transportation difficulties. Her mother had open heart surgery and is in the ICU in [place] therefore her brother was unable to transport. Dr. Jarrett Ables office graciously arranged time to see her today. From my point of view she is going to require IV vancomycin plus perhaps a third generation cephalosporin. I plan to keep her on doxycycline and Augmentin until the IV antibiotics can be arranged. 11/02/2016 - Zaiah presents today for management of ulcers; She saw Dr. 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 She is to have the PICC line placed today and receive the initial dose for both antibiotics today. She has yet to receive the offloading chair cushion and/or mattress overlay from home health, apparently this has been a 3 week process. I encouraged her to speak to home health regarding this matter, along with offering home health to contact the wouns care  center with any questions or concerns. The left ischial pressure ulcer continues to imporve, while to right ischial ulcer has increased in depth. The popliteal fossa sinus tract remains unmeasurable due to the limitation of depth measurement (tract extends beyond our measuring devices). 11-16-2016 Ms. Henricks presents today for evaluation and management of bilateral ischial stage IV pressure ulcers and sinus tract to the left popliteal fossa. she is under the care of Dr. Sampson Goon for IV antibiotic therapy; she states that the vancomycin was placed on hold and will be restarted at a lower dose based on her renal function. She continues taking Zosyn in addition to the vancomycin. She also states that she has yet to receive offloading cushions from home health, according to her she does not qualify for these offloading cushions because "the ulcers are unstageable ". We will contact the home health agency today to lend clarity regarding her pressure ulcers. The left popliteal fossa sinus tract continues to be a measurable as it extends beyond the length of our measuring devices.. 11/30/16; the patient is still on vancomycin and Zosyn. The depth of the draining sinus behind her left popliteal fossa is down to 4 cm although there is still serosanguineous drainage coming out of this. She saw Dr. Sampson Goon of infectious disease yesterday the idea is to weeks more of IV antibiotics and then oral antibiotics although I have not read his note. The area on the left gluteal fold is just about healed. She has a 6 cm draining  sinus over the right initial tuberosity although I cannot feel bone at the base of this. As far as the patient is aware she has not had a recheck of her inflammatory markers. 12/07/16; patient is on vancomycin and Zosyn appointment with Dr. Sampson Goon on the 19th at which point the patient expects to have a change in antibiotics. Remarkable improvement over the wound over the left ischial tuberosity which is just about closed. The draining sinus in her popliteal fossa has 0.4 cm in depth. The area on the right ischial tuberosity still probes down 7 cm. This is closed and overall wound dimensions [name], [name] ([id number]) but not depth. 12/14/16; patient is completing her vancomycin and Zosyn and per her she is going to transition to Bactrim and Augmentin for another 3 weeks. The area on her left gluteal fold is closed except for some skin tears. The area behind her left knee is no longer has any depth. The only remaining area that is of clinical concern is on the right gluteal fold probing towards the right ischial tuberosity. Today this measures 6.9 cm in depth. Very gritty surface 12/21/16; patient is now on Bactrim and Augmentin as directed by infectious disease. This should be for another 2 weeks. The area in her left gluteal fold and left popliteal are closed over and fully healed. Measurements today at 7 cm in the right buttock wound is unchanged from last week. 12/27/16; patient is on Bactrim and Augmentin for another week as directed by infectious disease. She has completed her IV antibiotics. She is not been systemically unwell no fever no chills. The area on her right buttock measured over 6 cm in depth. There is no palpable bone. No evidence of surrounding soft tissue infection. She is complaining of tongue irritation and has a history of thrush 01/04/17; patient is been back to see Dr. Sampson Goon, her Augmentin was stopped but he continued the Bactrim for another 3 weeks. Depth of the  wound is 6.7 cm there  is been no major change in either direction. She is not receive the wound VAC from home health I think because of confusion about who is supposed to provided will actually talk to the home health agency today [facility] The net no major change. 01/18/17; patient obtained her wound VAC about 10 days ago however for some reason it was not actually put on the wound. She is therefore here for [place] to apply this I guess. No other issues are noted. She is not complaining of pain fever drainage 02/01/17; patient is here now having the wound VAC or 3-4 weeks to a deep pressure area over the right initial tuberosity. This measures 6 cm in depth today which is about half a centimeter better than 2 weeks ago. There is no evidence she is systemically unwell no fever no chills no pain around the area. 02/15/17; I follow this patient every 2 weeks for a deep area over the right ischial tuberosity. This measures 5.5 cm today which is a continued improvement of 1.2 cm from 1/10 and down 0.5 cm from her visit 2 weeks ago 03/01/17; continued difficult area over the right initial tuberosity using the wound VAC. Depth today of 5.1 cm which is improved. Does not appear to be a lot of drainage in the canister. Antibiotics were finally stopped by Dr. Sampson Goon bactrim[]  and inflammatory markers have been repeated 03/15/17; fall this lady every 2 weeks for a difficult area over her lower right gluteal area/ischial tuberosity. Depth today at 4.6 cm. This is a slow but steady improvement in the depth of this wound. Although we have labeled this as a pressure ulcer there may have been an underlying infection here at one point before we saw her. She had osteomyelitis on the left extensively which is since resolved 03/29/17- patient is here for follow-up evaluation of her right ischial pressure ulcer. She continues with the wound VAC and home health. According to the nurse home health has been using less foams  and appropriate and we will instruct accordingly. The patient continues to smoke, approximately 10 cigarettes a day. She has been advised to decrease that in half by her next appointment, with a goal of complete cessation. She states her blood sugars have been consistently less than 150. She states that she spends most of her day position left lateral or prone. She does have an air mattress on her bed, she does not have an mattress for her chair, she states she cannot afford this. 04/12/17; patient is here for evaluation of her right ischial pressure ulcer. We continue to use a wound VAC with minimal improvement today the depth of this measuring 4.4 cm versus 4.6 cm 2 weeks ago. We are using a [employer] wound VAC on this area. There is not excessive drainage no pain. The patient tells me she tries to keep off this in bed but is up in the wheelchair for 2 hours a day. She is limited in no her overall ambulation but is improving and apparently is getting bilateral lower extremity braces which she hopes will improve her ability to walk independently. 05/10/17; Depth at 3.3cm. Improved 05/24/17; depth at 3.5 cm. This is not improved since last time. Not clear if they are using collagen under the foam Mateus, Ivyonna L. ([id number]) Objective Constitutional Sitting or standing Blood Pressure is within target range for patient.. Pulse regular and within target range for patient.Marland Kitchen Respirations regular, non-labored and within target range.. Temperature is normal and within the target range for the  patient.Marland Kitchen appears in no distress. Vitals Time Taken: 8:23 AM, Height: 63 in, Weight: 257 lbs, BMI: 45.5, Temperature: 98.2 F, Pulse: 82 bpm, Respiratory Rate: 18 breaths/min, Blood Pressure: 133/73 mmHg. General Notes: Wound exam; right buttock/right ischial tuberosity area. 3.5 cm of depth this is not improved. Again using an open curet a blind debridement of the surface of this of surface material which is  sandpaper consistency. Copious amounts to get down to bleeding tissue. There is no evidence of infection in this area, no erythema around the wound, no soft tissue crepitus, no tenderness Integumentary (Hair, Skin) Wound #3 status is Open. Original cause of wound was Pressure Injury. The wound is located on the Right Gluteal fold. The wound measures 1cm length x 1.7cm width x 3.7cm depth; 1.335cm^2 area and 4.94cm^3 volume. There is Fat Layer (Subcutaneous Tissue) Exposed exposed. There is no tunneling or undermining noted. There is a large amount of serous drainage noted. The wound margin is distinct with the outline attached to the wound base. There is medium (34-66%) red granulation within the wound bed. There is a medium (34-66%) amount of necrotic tissue within the wound bed including Adherent Slough. The periwound skin appearance exhibited: Scarring, Maceration. The periwound skin appearance did not exhibit: Callus, Crepitus, Excoriation, Induration, Rash, Dry/Scaly, Atrophie Blanche, Cyanosis, Ecchymosis, Hemosiderin Staining, Mottled, Pallor, Rubor, Erythema. Periwound temperature was noted as No Abnormality. The periwound has tenderness on palpation. Assessment Active Problems ICD-10 L89.314 - Pressure ulcer of right buttock, stage 4 E11.42 - Type 2 diabetes mellitus with diabetic polyneuropathy Dieu, Gaynelle L. ([id number]) Procedures Wound #3 Pre-procedure diagnosis of Wound #3 is a Pressure Ulcer located on the Right Gluteal fold . There was a Skin/Subcutaneous Tissue Debridement ([id number]) debridement with total area of 1.7 sq cm performed by Afful, RN, BSN, [place] Sink, RN. with the following instrument(s): Curette including Exudate, Fat Layer (and Subcutaneous Tissue) Exposed, Fibrin/Slough, and Subcutaneous after achieving pain control using Lidocaine 4% Topical Solution. A time out was conducted at 08:37, prior to the start of the procedure. A Minimum amount of bleeding was  controlled with Pressure. The procedure was tolerated well with a pain level of 0 throughout and a pain level of 0 following the procedure. Post Debridement Measurements: 1cm length x 1.7cm width x 3.7cm depth; 4.94cm^3 volume. Post debridement Stage noted as Category/Stage IV. Character of Wound/Ulcer Post Debridement is stable. Post procedure Diagnosis Wound #3: Same as Pre-Procedure Plan Wound Cleansing: Wound #3 Right Gluteal fold: Clean wound with Normal Saline. Cleanse wound with mild soap and water Anesthetic: Wound #3 Right Gluteal fold: Topical Lidocaine 4% cream applied to wound bed prior to debridement - clinic use only Skin Barriers/Peri-Wound Care: Wound #3 Right Gluteal fold: Skin Prep Antifungal powder-Nystatin - Nystatin Powder to skin then gauze under drape of wound vac Primary Wound Dressing: Wound #3 Right Gluteal fold: Dry Gauze - over the nystatin powder Prisma Ag - (silver collagen) moisten with saline place in wound all the way to wound bed under wound vac then place the white foam Dressing Change Frequency: Wound #3 Right Gluteal fold: Three times weekly - HHRN to change MONDAY, and FRIDAY and pt to come to wound clinic on [facility], every other week. Follow-up Appointments: Wound #3 Right Gluteal fold: Return Appointment in 2 weeks. Off-Loading: Wound #3 Right Gluteal fold: Roho cushion for wheelchair - *********[facility] to order*********** Home Health: NAARAH, COTTONGIM ([id number]) Wound #3 Right Gluteal fold: Continue Home Health Visits - [facility] *****Please  order [facility] cushion****** Home Health Nurse may visit PRN to address patient s wound care needs. FACE TO FACE ENCOUNTER: MEDICARE and MEDICAID PATIENTS: I certify that this patient is under my care and that I had a face-to-face encounter that meets the physician face-to-face encounter requirements with this patient on this date. The encounter with the patient was in whole or  in part for the following MEDICAL CONDITION: (primary reason for Home Healthcare) MEDICAL NECESSITY: I certify, that based on my findings, NURSING services are a medically necessary home health service. HOME BOUND STATUS: I certify that my clinical findings support that this patient is homebound (i.e., Due to illness or injury, pt requires aid of supportive devices such as crutches, cane, wheelchairs, walkers, the use of special transportation or the assistance of another person to leave their place of residence. There is a normal inability to leave the home and doing so requires considerable and taxing effort. Other absences are for medical reasons / religious services and are infrequent or of short duration when for other reasons). If current dressing causes regression in wound condition, may D/C ordered dressing product/s and apply Normal Saline Moist Dressing daily until next Wound Healing Center / Other MD appointment. Notify Wound Healing Center of regression in wound condition at [phone]. Please direct any NON-WOUND related issues/requests for orders to patient's Primary Care Physician Negative Pressure Wound Therapy: Wound #3 Right Gluteal fold: Wound VAC settings at 125/130 mmHg continuous pressure. Use BLACK/GREEN foam to wound cavity. Use WHITE foam to fill any tunnel/s and/or undermining. Change VAC dressing 3 X WEEK. Change canister as indicated when full. Nurse may titrate settings and frequency of dressing changes as clinically indicated. - Prisma Ag (silver collagen) moisten with saline place in wound all the way to wound bed under wound vac then white foam Nystatin Powder to skin then gauze under drape of wound vac Home Health Nurse may d/c VAC for s/s of increased infection, significant wound regression, or uncontrolled drainage. Notify Wound Healing Center at [phone]. Number of foam/gauze pieces used in the dressing = Medications-please add to medication  list.: Wound #3 Right Gluteal fold: Other: - Vitamin C, Zinc, Multivitamins Nystatin Powder to skin then gauze under drape of wound vac #1 disappointing today that there is been no further reduction in the depth of this probing area. Up until this point she had been making improvements every 2 week and a half centimeter range. I'm not changing the dressing today which will include the collagen under the white foam however if this does not reduce further by next visit we'll have to consider options. 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 Facility Procedures CPT4 Code: 65784696 Description: 11042 - DEB SUBQ TISSUE 20 SQ CM/< ICD-10 Description Diagnosis L89.314 Pressure ulcer of right buttock, stage 4 E11.42 Type 2  diabetes mellitus with diabetic polyneur Modifier: opathy Quantity: 1 Physician Procedures CPT4 Code: 1610960 Description: 11042 - WC PHYS SUBQ TISS 20 SQ CM ICD-10 Description Diagnosis L89.314 Pressure ulcer of right buttock, stage 4 E11.42 Type 2 diabetes mellitus with  diabetic polyneur Modifier: opathy Quantity: 1 Electronic Signature(s) Signed: 05/24/2017 4:44:14 PM By: Baltazar Najjar MD Entered By: Baltazar Najjar on 05/24/2017 08:50:54

## 2017-06-07 ENCOUNTER — Encounter: Payer: BLUE CROSS/BLUE SHIELD | Attending: Internal Medicine | Admitting: Internal Medicine

## 2017-06-07 DIAGNOSIS — E1142 Type 2 diabetes mellitus with diabetic polyneuropathy: Secondary | ICD-10-CM | POA: Diagnosis not present

## 2017-06-07 DIAGNOSIS — K219 Gastro-esophageal reflux disease without esophagitis: Secondary | ICD-10-CM | POA: Insufficient documentation

## 2017-06-07 DIAGNOSIS — F411 Generalized anxiety disorder: Secondary | ICD-10-CM | POA: Insufficient documentation

## 2017-06-07 DIAGNOSIS — F17210 Nicotine dependence, cigarettes, uncomplicated: Secondary | ICD-10-CM | POA: Diagnosis not present

## 2017-06-07 DIAGNOSIS — L89314 Pressure ulcer of right buttock, stage 4: Secondary | ICD-10-CM | POA: Insufficient documentation

## 2017-06-07 DIAGNOSIS — J449 Chronic obstructive pulmonary disease, unspecified: Secondary | ICD-10-CM | POA: Diagnosis not present

## 2017-06-07 DIAGNOSIS — I10 Essential (primary) hypertension: Secondary | ICD-10-CM | POA: Insufficient documentation

## 2017-06-08 NOTE — Progress Notes (Signed)
[name] ([id number]) Visit Report for 06/07/2017 Arrival Information Details Patient Name: [name], [name] Date of Service: 06/07/2017 8:00 AM Medical Record Patient Account Number: [id number] Number: Treating RN: Ahmed Prima 07/28/1961 (56 y.o. Other Clinician: Date of Birth/Sex: Female) Treating ROBSON, MICHAEL Primary Care Samanta Gal: Lavera Guise Melrose Kearse/Extender: G Referring George Alcantar: Sallee Lange in Treatment: 71 Visit Information History Since Last Visit All ordered tests and consults were completed: No Patient Arrived: Wheel Chair Added or deleted any medications: No Arrival Time: 08:03 Any new allergies or adverse reactions: No Accompanied By: self Had a fall or experienced change in No Transfer Assistance: EasyP[employer] activities of daily living that may affect Patient Lift risk of falls: Patient Identification Verified: Yes Signs or symptoms of abuse/neglect since last No Secondary Verification Process Yes visito Completed: Hospitalized since last visit: No Patient Requires Transmission- No Has Dressing in Place as Prescribed: Yes Based Precautions: Pain Present Now: No Patient Has Alerts: Yes Patient Alerts: DM II Electronic Signature(s) Signed: 06/07/2017 4:50:29 PM By: Alric Quan Entered By: Alric Quan on 06/07/2017 08:07:18 [name], [name] ([id number]) -------------------------------------------------------------------------------- Encounter Discharge Information Details Patient Name: [name] Date of Service: 06/07/2017 8:00 AM Medical Record Patient Account Number: [id number] Number: Treating RN: Ahmed Prima 1961-01-23 (56 y.o. Other Clinician: Date of Birth/Sex: Female) Treating ROBSON, MICHAEL Primary Care Eman Rynders: Lavera Guise Amma Crear/Extender: G Referring Vianey Caniglia: Sallee Lange in Treatment: 37 Encounter Discharge Information Items Discharge Pain Level: 0 Discharge Condition:  Stable Ambulatory Status: Wheelchair Discharge Destination: Home Transportation: Other Accompanied By: self Schedule Follow-up Appointment: Yes Medication Reconciliation completed and provided to Patient/Care No Jayde Daffin: Provided on Clinical Summary of Care: 06/07/2017 Form Type Recipient Paper Patient TW Electronic Signature(s) Signed: 06/07/2017 4:50:29 PM By: Alric Quan Previous Signature: 06/07/2017 8:56:44 AM Version By: Ruthine Dose Entered By: Alric Quan on 06/07/2017 09:29:01 [name], [name] ([id number]) -------------------------------------------------------------------------------- Lower Extremity Assessment Details Patient Name: [name] Date of Service: 06/07/2017 8:00 AM Medical Record Patient Account Number: [id number] Number: Treating RN: Ahmed Prima 1961/05/18 (56 y.o. Other Clinician: Date of Birth/Sex: Female) Treating ROBSON, MICHAEL Primary Care Varonica Siharath: Lavera Guise Rakeisha Nyce/Extender: G Referring Spenser Cong: Sallee Lange in Treatment: 37 Electronic Signature(s) Signed: 06/07/2017 4:50:29 PM By: Alric Quan Entered By: Alric Quan on 06/07/2017 08:20:38 [name], [name] ([id number]) -------------------------------------------------------------------------------- Multi Wound Chart Details Patient Name: [name] Date of Service: 06/07/2017 8:00 AM Medical Record Patient Account Number: [id number] Number: Treating RN: Ahmed Prima [birth year] (56 y.o. Other Clinician: Date of Birth/Sex: Female) Treating ROBSON, MICHAEL Primary Care Jahmal Dunavant: Lavera Guise Laelle Bridgett/Extender: G Referring Kaylan Yates: Sallee Lange in Treatment: 37 Vital Signs Height(in): 63 Pulse(bpm): 97 Weight(lbs): 257 Blood Pressure 141/78 (mmHg): Body Mass Index(BMI): 46 Temperature(F): 98.5 Respiratory Rate 18 (breaths/min): Photos: [3:No Photos] [N/A:N/A] Wound Location: [3:Right Gluteal fold]  [N/A:N/A] Wounding Event: [3:Pressure Injury] [N/A:N/A] Primary Etiology: [3:Pressure Ulcer] [N/A:N/A] Comorbid History: [3:Asthma, Hypertension, Type II Diabetes, Neuropathy] [N/A:N/A] Date Acquired: [3:04/21/2016] [N/A:N/A] Weeks of Treatment: [3:37] [N/A:N/A] Wound Status: [3:Open] [N/A:N/A] Measurements L x W x D 1x1.7x2.9 [N/A:N/A] (cm) Area (cm) : [3:1.335] [N/A:N/A] Volume (cm) : [3:3.872] [N/A:N/A] % Reduction in Area: [3:56.50%] [N/A:N/A] % Reduction in Volume: 77.50% [N/A:N/A] Classification: [3:Category/Stage IV] [N/A:N/A] Exudate Amount: [3:Large] [N/A:N/A] Exudate Type: [3:Serous] [N/A:N/A] Exudate Color: [3:amber] [N/A:N/A] Foul Odor After [3:Yes] [N/A:N/A] Cleansing: Odor Anticipated Due to No [N/A:N/A] Product Use: Wound Margin: [3:Distinct, outline attached] [N/A:N/A] Granulation Amount: [3:Medium (34-66%)] [N/A:N/A] Granulation Quality: [3:Red] [N/A:N/A] Necrotic Amount: [3:Medium (34-66%)] [N/A:N/A] Exposed Structures:  Fat Layer (Subcutaneous N/A N/A Tissue) Exposed: Yes Epithelialization: None N/A N/A Debridement: Debridement (16109- N/A N/A 11047) Pre-procedure 08:23 N/A N/A Verification/Time Out Taken: Pain Control: Lidocaine 4% Topical N/A N/A Solution Tissue Debrided: Fibrin/Slough, Exudates, N/A N/A Subcutaneous Level: Skin/Subcutaneous N/A N/A Tissue Debridement Area (sq 1.7 N/A N/A cm): Instrument: Curette, Other(scoop) N/A N/A Bleeding: Minimum N/A N/A Hemostasis Achieved: Pressure N/A N/A Procedural Pain: 0 N/A N/A Post Procedural Pain: 0 N/A N/A Debridement Treatment Procedure was tolerated N/A N/A Response: well Post Debridement 1x1.7x2.9 N/A N/A Measurements L x W x D (cm) Post Debridement 3.872 N/A N/A Volume: (cm) Post Debridement Category/Stage IV N/A N/A Stage: Periwound Skin Texture: Scarring: Yes N/A N/A Excoriation: No Induration: No Callus: No Crepitus: No Rash: No Periwound Skin Maceration: Yes N/A  N/A Moisture: Dry/Scaly: No Periwound Skin Color: Atrophie Blanche: No N/A N/A Cyanosis: No Ecchymosis: No Erythema: No Hemosiderin Staining: No Mottled: No Pallor: No Rubor: No Temperature: No Abnormality N/A N/A Tenderness on Yes N/A N/A Palpation: Wound Preparation: N/A N/A [name], [name] ([id number]) Ulcer Cleansing: Rinsed/Irrigated with Saline Topical Anesthetic Applied: Other: lidocaine 4% Procedures Performed: Debridement N/A N/A Treatment Notes Electronic Signature(s) Signed: 06/07/2017 5:26:18 PM By: Linton Ham MD Entered By: Linton Ham on 06/07/2017 08:56:05 Ackerman, [name] ([id number]) -------------------------------------------------------------------------------- Multi-Disciplinary Care Plan Details Patient Name: [name] Date of Service: 06/07/2017 8:00 AM Medical Record Patient Account Number: [id number] Number: Treating RN: Ahmed Prima 22-Oct-1961 (56 y.o. Other Clinician: Date of Birth/Sex: Female) Treating ROBSON, MICHAEL Primary Care Madason Rauls: Lavera Guise Tanvi Gatling/Extender: G Referring Luvina Poirier: Sallee Lange in Treatment: 54 Active Inactive ` Abuse / Safety / Falls / Self Care Management Nursing Diagnoses: Potential for falls Goals: Patient will remain injury free Date Initiated: 09/21/2016 Target Resolution Date: 03/25/2017 Goal Status: Active Interventions: Assess fall risk on admission and as needed Notes: ` Nutrition Nursing Diagnoses: Imbalanced nutrition Potential for alteratiion in Nutrition/Potential for imbalanced nutrition Goals: Patient/caregiver agrees to and verbalizes understanding of need to use nutritional supplements and/or vitamins as prescribed Date Initiated: 09/21/2016 Target Resolution Date: 03/25/2017 Goal Status: Active Patient/caregiver verbalizes understanding of need to maintain therapeutic glucose control per primary care physician Date Initiated: 09/21/2016 Target Resolution  Date: 03/25/2017 Goal Status: Active Interventions: Assess patient nutrition upon admission and as needed per policy Karp, [place]. ([id number]) Notes: ` Orientation to the Wound Care Program Nursing Diagnoses: Knowledge deficit related to the wound healing center program Goals: Patient/caregiver will verbalize understanding of the [place] Date Initiated: 09/21/2016 Target Resolution Date: 03/25/2017 Goal Status: Active Interventions: Provide education on orientation to the wound center Notes: ` Pain, Acute or Chronic Nursing Diagnoses: Pain, acute or chronic: actual or potential Potential alteration in comfort, pain Goals: Patient will verbalize adequate pain control and receive pain control interventions during procedures as needed Date Initiated: 09/21/2016 Target Resolution Date: 03/25/2017 Goal Status: Active Patient/caregiver will verbalize adequate pain control between visits Date Initiated: 09/21/2016 Target Resolution Date: 03/25/2017 Goal Status: Active Patient/caregiver will verbalize comfort level met Date Initiated: 09/21/2016 Target Resolution Date: 03/25/2017 Goal Status: Active Interventions: Assess comfort goal upon admission Complete pain assessment as per visit requirements Notes: ` Wound/Skin Impairment Nursing Diagnoses: [name], [name] ([id number]) Impaired tissue integrity Goals: Ulcer/skin breakdown will have a volume reduction of 30% by week 4 Date Initiated: 09/21/2016 Target Resolution Date: 03/25/2017 Goal Status: Active Ulcer/skin breakdown will have a volume reduction of 50% by week 8 Date Initiated: 09/21/2016 Target Resolution Date: 03/25/2017 Goal Status: Active Ulcer/skin  breakdown will have a volume reduction of 80% by week 12 Date Initiated: 09/21/2016 Target Resolution Date: 03/25/2017 Goal Status: Active Interventions: Assess ulceration(s) every visit Notes: Electronic Signature(s) Signed: 06/07/2017 4:50:29 PM By:  Alric Quan Entered By: Alric Quan on 06/07/2017 08:20:43 [name], [name] ([id number]) -------------------------------------------------------------------------------- Pain Assessment Details Patient Name: [name] Date of Service: 06/07/2017 8:00 AM Medical Record Patient Account Number: [id number] Number: Treating RN: Ahmed Prima 1961/09/11 (56 y.o. Other Clinician: Date of Birth/Sex: Female) Treating ROBSON, MICHAEL Primary Care Jayra Choyce: Lavera Guise Arien Morine/Extender: G Referring Tyrese Capriotti: Sallee Lange in Treatment: 37 Active Problems Location of Pain Severity and Description of Pain Patient Has Paino No Site Locations With Dressing Change: No Pain Management and Medication Current Pain Management: Electronic Signature(s) Signed: 06/07/2017 4:50:29 PM By: Alric Quan Entered By: Alric Quan on 06/07/2017 08:08:20 Wisser, [name] ([id number]) -------------------------------------------------------------------------------- Patient/Caregiver Education Details Patient Name: [name] Date of Service: 06/07/2017 8:00 AM Medical Record Patient Account Number: [id number] Number: Treating RN: Ahmed Prima [birth year] (56 y.o. Other Clinician: Date of Birth/Gender: Female) Treating ROBSON, MICHAEL Primary Care Physician: Lavera Guise Physician/Extender: G Referring Physician: Sallee Lange in Treatment: 65 Education Assessment Education Provided To: Patient Education Topics Provided Wound/Skin Impairment: Handouts: Other: change dressing as ordered Methods: Demonstration, Explain/Verbal Responses: State content correctly Electronic Signature(s) Signed: 06/07/2017 4:50:29 PM By: Alric Quan Entered By: Alric Quan on 06/07/2017 08:25:24 Smotherman, [name] ([id number]) -------------------------------------------------------------------------------- Wound Assessment Details Patient Name: [name] Date of Service: 06/07/2017 8:00 AM Medical Record Patient Account Number: [id number] Number: Treating RN: Ahmed Prima 1961/08/26 (56 y.o. Other Clinician: Date of Birth/Sex: Female) Treating ROBSON, MICHAEL Primary Care Thaniel Coluccio: Lavera Guise Harshith Pursell/Extender: G Referring Everrett Lacasse: Sallee Lange in Treatment: 37 Wound Status Wound Number: 3 Primary Pressure Ulcer Etiology: Wound Location: Right Gluteal fold Wound Status: Open Wounding Event: Pressure Injury Comorbid Asthma, Hypertension, Type II Date Acquired: 04/21/2016 History: Diabetes, Neuropathy Weeks Of Treatment: 37 Clustered Wound: No Photos Photo Uploaded By: Alric Quan on 06/07/2017 10:28:12 Wound Measurements Length: (cm) 1 Width: (cm) 1.7 Depth: (cm) 2.9 Area: (cm) 1.335 Volume: (cm) 3.872 % Reduction in Area: 56.5% % Reduction in Volume: 77.5% Epithelialization: None Tunneling: No Undermining: No Wound Description Classification: Category/Stage IV Foul Odor Aft Wound Margin: Distinct, outline attached Due to Produc Exudate Amount: Large Slough/Fibrin Exudate Type: Serous Exudate Color: amber er Cleansing: Yes t Use: No o Yes Wound Bed Granulation Amount: Medium (34-66%) Exposed Structure Granulation Quality: Red Fat Layer (Subcutaneous Tissue) Exposed: Yes Necrotic Amount: Medium (34-66%) Kincer, [name] ([id number]) Necrotic Quality: Adherent Slough Periwound Skin Texture Texture Color No Abnormalities Noted: No No Abnormalities Noted: No Callus: No Atrophie Blanche: No Crepitus: No Cyanosis: No Excoriation: No Ecchymosis: No Induration: No Erythema: No Rash: No Hemosiderin Staining: No Scarring: Yes Mottled: No Pallor: No Moisture Rubor: No No Abnormalities Noted: No Dry / Scaly: No Temperature / Pain Maceration: Yes Temperature: No Abnormality Tenderness on Palpation: Yes Wound Preparation Ulcer Cleansing: Rinsed/Irrigated with Saline Topical  Anesthetic Applied: Other: lidocaine 4%, Treatment Notes Wound #3 (Right Gluteal fold) 1. Cleansed with: Clean wound with Normal Saline 3. Peri-wound Care: Antifungal powder Skin Prep 4. Dressing Applied: Prisma Ag 8. Negative Pressure Wound Therapy Wound Vac to wound continuously at 115m/hg pressure Black Foam White Foam Notes 1 PIECE BLACK FOAM TO BRIDGE I piece of white foam Electronic Signature(s) Signed: 06/07/2017 4:50:29 PM By: PAlric QuanEntered By: PAlric Quanon 06/07/2017 08:19:36 [name], [name] ([id number] --------------------------------------------------------------------------------  Vitals Details Patient Name: [name], [name] Date of Service: 06/07/2017 8:00 AM Medical Record Patient Account Number: [id number] Number: Treating RN: Ahmed Prima August 08, 1961 (56 y.o. Other Clinician: Date of Birth/Sex: Female) Treating ROBSON, MICHAEL Primary Care Thamas Appleyard: Lavera Guise Trevonte Ashkar/Extender: G Referring Roscoe Witts: Sallee Lange in Treatment: 37 Vital Signs Time Taken: 08:08 Temperature (F): 98.5 Height (in): 63 Pulse (bpm): 97 Weight (lbs): 257 Respiratory Rate (breaths/min): 18 Body Mass Index (BMI): 45.5 Blood Pressure (mmHg): 141/78 Reference Range: 80 - 120 mg / dl Electronic Signature(s) Signed: 06/07/2017 4:50:29 PM By: Alric Quan Entered By: Alric Quan on 06/07/2017 08:08:51

## 2017-06-20 ENCOUNTER — Ambulatory Visit: Payer: Managed Care, Other (non HMO) | Admitting: Pain Medicine

## 2017-06-21 ENCOUNTER — Encounter: Payer: BLUE CROSS/BLUE SHIELD | Admitting: Internal Medicine

## 2017-06-21 DIAGNOSIS — L89314 Pressure ulcer of right buttock, stage 4: Secondary | ICD-10-CM | POA: Diagnosis not present

## 2017-06-22 NOTE — Progress Notes (Signed)
Katelyn, [name] ([id number]) Visit Report for 06/07/2017 Chief Complaint Document Details Patient Name: [name], [name] Date of Service: 06/07/2017 8:00 AM Medical Record Patient Account Number: [id number] Number: Treating RN: Riki Sheer 01/20/1961 (56 y.o. Other Clinician: Date of Birth/Sex: Female) Treating ROBSON, MICHAEL Primary Care Provider: Joen [name] Provider/Extender: G Referring Provider: Tedra Senegal in Treatment: 37 Information Obtained from: Patient Chief Complaint Patient is here for follow-up of a relation of her right ischial pressure ulcer Electronic Signature(s) Signed: 06/07/2017 5:26:18 PM By: Baltazar Najjar MD Entered By: Baltazar Najjar on 06/07/2017 08:56:46 [name], [name] ([id number]) -------------------------------------------------------------------------------- Debridement Details Patient Name: [name] Date of Service: 06/07/2017 8:00 AM Medical Record Patient Account Number: [id number] Number: Treating RN: Huel Coventry [birth year] (56 y.o. Other Clinician: Date of Birth/Sex: Female) Treating ROBSON, MICHAEL Primary Care Provider: Joen [name] Provider/Extender: G Referring Provider: Tedra Senegal in Treatment: 37 Debridement Performed for Wound #3 Right Gluteal fold Assessment: Performed By: Physician Maxwell Caul, MD Debridement: Debridement Pre-procedure Verification/Time Out Yes - 08:23 Taken: Start Time: 08:24 Pain Control: Lidocaine 4% Topical Solution Level: Skin/Subcutaneous Tissue Total Area Debrided (L x 1 (cm) x 1.7 (cm) = 1.7 (cm) W): Tissue and other Viable, Non-Viable, Exudate, Fibrin/Slough, Subcutaneous material debrided: Instrument: Curette, Other : scoop Bleeding: Minimum Hemostasis Achieved: Pressure End Time: 08:28 Procedural Pain: 0 Post Procedural Pain: 0 Response to Treatment: Procedure was tolerated well Post Debridement Measurements of Total Wound Length: (cm)  1 Stage: Category/Stage IV Width: (cm) 1.7 Depth: (cm) 2.9 Volume: (cm) 3.872 Character of Wound/Ulcer Post Requires Further Debridement: Debridement Post Procedure Diagnosis Same as Pre-procedure Electronic Signature(s) Signed: 06/14/2017 8:36:24 AM By: Elliot Gurney, BSN, RN, CWS, Kim RN, BSN Signed: 06/21/2017 5:07:07 PM By: Baltazar Najjar MD Previous Signature: 06/07/2017 5:26:18 PM Version By: Baltazar Najjar MD [name], [name] ([id number]) Entered By: Elliot Gurney, BSN, RN, CWS, Kim on 06/14/2017 08:36:24 [name], [name] ([id number]) -------------------------------------------------------------------------------- HPI Details Patient Name: [name] Date of Service: 06/07/2017 8:00 AM Medical Record Patient Account Number: [id number] Number: Treating RN: Riki Sheer 10-Jul-1961 (56 y.o. Other Clinician: Date of Birth/Sex: Female) Treating ROBSON, MICHAEL Primary Care Provider: Joen [name] Provider/Extender: G Referring Provider: Tedra Senegal in Treatment: 37 History of Present Illness Location: bilateral gluteal ulcerations and left popliteal ulceration with tunneling Quality: adits to intermittent aching to ischial ulcers, no discomfort to sinus tract Severity: right iscial ulcer with increased depth Duration: chronic ulcers to bilateral ischial ulcers and sinus tract Timing: The pain is intermittent in severity as far as how intense it becomes but is present all the time. Manipulationn makes this worse. Context: The wound occurred when the patient had a fall and was unconscious for about 48 hours laying on the floor and she had pressure injury at that stage. Modifying Factors: Other treatment(s) tried include:as noted below she has been seen by visiting wound care physicians or nurse practitioners and details have been noted Associated Signs and Symptoms: has yet to receive offloading cushions and mattress overlay HPI Description: 56 year old patient who was seen  by visiting [name] wound care specialist for a wound on both her buttock and was found to have an unstageable wound on the right buttock for about 2 months. I understand that she had a fall and was laying on the floor for about 48 hours before she was found and taken to the ICU and had a long injury to her gluteal area from pressure and also had broken her right humerus. She  has had a right proximal humerus fracture and has been followed up with orthopedics recently. The patient has a past medical history of type 2 diabetes mellitus, paraparesis, acute pyelonephritis, GERD, hypertension, glaucoma, chronic pain, anxiety neurosis, nicotine dependence, COPD. the patient had some debridement done and was to operative was recommended to use Silvadene dressing and offloading. She is a smoker and occasionally smokes a few cigarettes. the patient requested a second opinion for months and is here to discuss her care. 09/21/16; the patient re-presents from home today for review of 3 different wounds. I note that she was seen in the clinic here in July at which time she had bilateral buttock wounds. It was apparently suggested at that time that she use a wound VAC bridged to both wounds just near the initial tuberosity's bilaterally which she refused. The history was a bit difficult to put together. Apparently this patient became ill at the end of April of this year. She was found sitting on the floor she had apparently been for 2 days and subsequently admitted to hospital from 04/23/16 through 05/02/16 and at that point she was critically ill ultimately having sepsis secondary to UTI, nontraumatic rhabdomyolysis and diabetic ketoacidosis. She had acute renal failure and I think required ICU care including intubation. Patient states her wounds actually started at that point on the bilateral issue tuberosities however in reviewing the discharge summary from 5/8 I see no reference to wounds at that point. It did  state that she had left lower extremity cellulitis however. Reviewing Epic I see no relevant x-rays. It would appear that her discharge creatinine was within the normal range and indeed on 9/15 her creatinine has remained normal. [facility]. ([id number]) rehabilitation. There the wounds on her bilateral Buttocks were dressed. Only just before her discharge from the nursing facility she developed an "knot" which was interpreted as cellulitis on the posterior aspect of her left knee she was given antibiotics. Apparently sometime late in July a this actually opened and became a wound at home health care was tending to however she is still having purulent drainage coming from this and by my understanding the wound depth is actually become unmeasurable. I am not really clear about what home health has been placing in any of these wound areas. The patient states that is something with silver and it. She is not been systemically unwell no fever or chills her appetite is good. She is a diabetic poorly controlled however she states that her recent blood sugars at home have been in the low to mid 100s. 09/28/16 On evaluation today patient appears to continue to exhibit the 3 areas of ulceration that were noted previous. She did have an x-ray of the right pelvis which showed evidence of potential soft tissue infection but no obvious osteomyelitis. There was a discussion last office visit concerning the possibility of a wound VAC. Witth that being said the x-ray report suggested that an MRI may be more appropriate to further evaluate the extent. Subsequently in regard to the wound over the popliteal portion of the left lower extremity with tunneling at 12:00 the CT scan that was ordered was denied by insurance as they state the patient has not had x-rays prior to advanced imaging. Patient states that she is frustrated with the  situation overall. 10/05/16 in the interval since I last saw this patient last week she has had the x-ray of the knee performed. I did  review that x-ray today and fortunately shows no evidence of osteomyelitis or other acute abnormality at this point in time. She continues to have the opening iin the posterior left popliteal space with tracking proximal up the posterior thigh. Nothing seems to have worsened but it also seems to have not improved. The same is true in regard to the right pressure ulcer over the gluteal region which extends toward the ischium. The left gluteal pressure ulcer actually appears to be doing somewhat better my opinion there is some necrotic slough but overall this appears fairly well. She tells me thatt she has some discomfort especially when home health is helping her with dressing changes as they do not know her. At worse she rates her pain to be a 5 out of 10 right now it's more like a 1 out of 10. 10/07/16; still the patient has 3 different wound areas. She has a deep stage IV wound over her right ischial tuberosity. She is due to have an MRI next week. The wound over her left ischial tuberosity is more superficial and underwent debridement today. Finally she has a small open area in her left popliteal fossa the probes on measurably forward superiorly. Still a lot of drainage coming out of this. The CT scan that I ordered 3 weeks ago was questioned by her insurance company wanting a plain x-ray first. As I understand things result of this is nothing has been done in 3 weeks in terms of imaging the thigh and she has an MRI booked of this along with her pelvis for next week line 10/12/16; patient has a deep probing wound over the right ischiall tuberosity, stage III wound over the left visual tuberosity and a draining sinus in her left popliteal fossa. None of this much different from when I saw this 3 weeks ago. We have been using silver rope to the right ischial  wound and a draining area in the left popliteal fossa. Plain silver alginate to the area on the left ischial tuberosity 10/19/16; the patient's wounds are essentially unchanged although the area on the left lower gluteal is actually improved. Our intake nurse noted drainage from the right initial tuberosity probing wound as well as the draining area in the left popliteal fossa. Both of these were cultured. She had x-rays I think at the insistence of her insurance company on 09/23/16 x-ray of the pelvis was not particularly helpful she did have soft tissue air over the right lower pelvis although with the depth of this wound this is not surprising. An x-ray of her left knee did not show any specific abnormalities. We are still using silver alginate to these wound areas. Her MRI is booked for 10/27 10/26/16; cultures of the purulent drainage in her right initial tuberosity wound grew moderate Proteus and few staph aureus. The same organisms were cultured out of the left knee sinus tract posteriorly. The staph aureus is MRSA. I had started her on Augmentin last week I added doxycycline. The MRI of the left lower extremity and pelvis was finally done. The MRI of the femur showed subcutaneous soft tissue swelling Gabbert, Hadley L. ([id number]) edema fluid and myositis in the vastus lateralis muscle but no soft tissue abscess septic arthritis or osteomyelitis. MRI of the pelvis showed the left wound to be more expensive extending down to the bone there was osteomyelitis. Left hamstring tendons were also involved. No septic arthritis involving the hip. The decubitus ulcer on the right side showed no definite osteomyelitis or  abscess.. The right hip wound is actually the one the probes 6 cm downward. But the MRI showing infection including osteomyelitis on the left explains the draining sinus in the popliteal fossa on the left. She did have antibiotics in the hospitalization last time and this extended into  her nursing home stay but I'm not exactly sure what antibiotics and for what duration. According the patient this did include vancomycin with considerable effort of our staff we are able to get the patient into see Dr. Sampson Goon today. There were transportation difficulties. Her mother had open heart surgery and is in the ICU in [place] therefore her brother was unable to transport. Dr. Jarrett Ables office graciously arranged time to see her today. From my point of view she is going to require IV vancomycin plus perhaps a third generation cephalosporin. I plan to keep her on doxycycline and Augmentin until the IV antibiotics can be arranged. 11/02/2016 - Elenore presents today for management of ulcers; She saw Dr. 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 She is to have the PICC line placed today and receive the initial dose for both antibiotics today. She has yet to receive the offloading chair cushion and/or mattress overlay from home health, apparently this has been a 3 week process. I encouraged her to speak to home health regarding this matter, along with offering home health to contact the wouns care center with any questions or concerns. The left ischial pressure ulcer continues to imporve, while to right ischial ulcer has increased in depth. The popliteal fossa sinus tract remains unmeasurable due to the limitation of depth measurement (tract extends beyond our measuring devices). 11-16-2016 Ms. Sleeper presents today for evaluation and management of bilateral ischial stage IV pressure ulcers and sinus tract to the left popliteal fossa. she is under the care of Dr. Sampson Goon for IV antibiotic therapy; she states that the vancomycin was placed on hold and will be restarted at a lower dose based on her renal function. She continues taking Zosyn in addition to the vancomycin. She also states that  she has yet to receive offloading cushions from home health, according to her she does not qualify for these offloading cushions because "the ulcers are unstageable ". We will contact the home health agency today to lend clarity regarding her pressure ulcers. The left popliteal fossa sinus tract continues to be a measurable as it extends beyond the length of our measuring devices.. 11/30/16; the patient is still on vancomycin and Zosyn. The depth of the draining sinus behind her left popliteal fossa is down to 4 cm although there is still serosanguineous drainage coming out of this. She saw Dr. Sampson Goon of infectious disease yesterday the idea is to weeks more of IV antibiotics and then oral antibiotics although I have not read his note. The area on the left gluteal fold is just about healed. She has a 6 cm draining sinus over the right initial tuberosity although I cannot feel bone at the base of this. As far as the patient is aware she has not had a recheck of her inflammatory markers. 12/07/16; patient is on vancomycin and Zosyn appointment with Dr. Sampson Goon on the 19th at which point the patient expects to have a change in antibiotics. Remarkable improvement over the wound over the left ischial tuberosity which is just about closed. The draining sinus in her popliteal fossa has 0.4 cm in depth. The area on the right ischial  tuberosity still probes down 7 cm. This is closed and overall wound dimensions but not depth. 12/14/16; patient is completing her vancomycin and Zosyn and per her she is going to transition to Bactrim and Augmentin for another 3 weeks. The area on her left gluteal fold is closed except for some skin tears. The area behind her left knee is no longer has any depth. The only remaining area that is of clinical concern is on the right gluteal fold probing towards the right ischial tuberosity. Today this measures 6.9 cm in depth. Very gritty surface 12/21/16; patient is now  on Bactrim and Augmentin as directed by infectious disease. This should be for another 2 weeks. The area in her left gluteal fold and left popliteal are closed over and fully healed. Notch, Kymani L. ([id number]) Measurements today at 7 cm in the right buttock wound is unchanged from last week. 12/27/16; patient is on Bactrim and Augmentin for another week as directed by infectious disease. She has completed her IV antibiotics. She is not been systemically unwell no fever no chills. The area on her right buttock measured over 6 cm in depth. There is no palpable bone. No evidence of surrounding soft tissue infection. She is complaining of tongue irritation and has a history of thrush 01/04/17; patient is been back to see Dr. Sampson Goon, her Augmentin was stopped but he continued the Bactrim for another 3 weeks. Depth of the wound is 6.7 cm there is been no major change in either direction. She is not receive the wound VAC from home health I think because of confusion about who is supposed to provided will actually talk to the [facility] agency [facility] The net no major change. 01/18/17; patient obtained her wound VAC about 10 days ago however for some reason it was not actually put on the wound. She is therefore here for [place] to apply this I guess. No other issues are noted. She is not complaining of pain fever drainage 02/01/17; patient is here now having the wound VAC or 3-4 weeks to a deep pressure area over the right initial tuberosity. This measures 6 cm in depth today which is about half a centimeter better than 2 weeks ago. There is no evidence she is systemically unwell no fever no chills no pain around the area. 02/15/17; I follow this patient every 2 weeks for a deep area over the right ischial tuberosity. This measures 5.5 cm today which is a continued improvement of 1.2 cm from 1/10 and down 0.5 cm from her visit 2 weeks ago 03/01/17; continued difficult area over the right initial  tuberosity using the wound VAC. Depth today of 5.1 cm which is improved. Does not appear to be a lot of drainage in the canister. Antibiotics were finally stopped by Dr. Sampson Goon bactrim[]  and inflammatory markers have been repeated 03/15/17; fall this lady every 2 weeks for a difficult area over her lower right gluteal area/ischial tuberosity. Depth today at 4.6 cm. This is a slow but steady improvement in the depth of this wound. Although we have labeled this as a pressure ulcer there may have been an underlying infection here at one point before we saw her. She had osteomyelitis on the left extensively which is since resolved 03/29/17- patient is here for follow-up evaluation of her right ischial pressure ulcer. She continues with the wound VAC and home health. According to the nurse home health has been using less foams and appropriate and we will instruct accordingly. The patient  continues to smoke, approximately 10 cigarettes a day. She has been advised to decrease that in half by her next appointment, with a goal of complete cessation. She states her blood sugars have been consistently less than 150. She states that she spends most of her day position left lateral or prone. She does have an air mattress on her bed, she does not have an mattress for her chair, she states she cannot afford this. 04/12/17; patient is here for evaluation of her right ischial pressure ulcer. We continue to use a wound VAC with minimal improvement today the depth of this measuring 4.4 cm versus 4.6 cm 2 weeks ago. We are using a [employer] wound VAC on this area. There is not excessive drainage no pain. The patient tells me she tries to keep off this in bed but is up in the wheelchair for 2 hours a day. She is limited in no her overall ambulation but is improving and apparently is getting bilateral lower extremity braces which she hopes will improve her ability to walk independently. 05/10/17; Depth at 3.3cm.  Improved 05/24/17; depth at 3.5 cm. This is not improved since last time. Not clear if they are using collagen under the foam 06/07/17; depth that 2.9 which is a slight improvement. Still using the wound VAC. Electronic Signature(s) Signed: 06/07/2017 5:26:18 PM By: Baltazar Najjar MD Entered By: Baltazar Najjar on 06/07/2017 08:57:41 [name], [name] ([id number]) -------------------------------------------------------------------------------- Physical Exam Details Patient Name: [name] Date of Service: 06/07/2017 8:00 AM Medical Record Patient Account Number: [id number] Number: Treating RN: Riki Sheer 07-06-1961 (56 y.o. Other Clinician: Date of Birth/Sex: Female) Treating ROBSON, MICHAEL Primary Care Provider: Joen [name] Provider/Extender: G Referring Provider: Tedra Senegal in Treatment: 37 Constitutional Sitting or standing Blood Pressure is within target range for patient.. Pulse regular and within target range for patient.Marland Kitchen Respirations regular, non-labored and within target range.. Temperature is normal and within the target range for the patient.Marland Kitchen appears in no distress. Eyes Conjunctivae clear. No discharge. Respiratory Respiratory effort is easy and symmetric bilaterally. Rate is normal at rest and on room air.. Cardiovascular . Lymphatic . Notes Wound exam; right buttock/initial tuberosity area. Depth of 2.9 cm slightly better. Again covered with a very adherent surface very difficult to debride. There is no evidence of infection although the patient finds the area irritated probably because of the [facility] draped Electronic Signature(s) Signed: 06/07/2017 5:26:18 PM By: Baltazar Najjar MD Entered By: Baltazar Najjar on 06/07/2017 09:00:00 [name], [name] -------------------------------------------------------------------------------- Physician Orders Details Patient Name: [name] Date of Service: 06/07/2017 8:00 AM Medical  Record Patient Account Number: [id number] Number: Treating RN: Phillis Haggis May 03, 1961 (56 y.o. Other Clinician: Date of Birth/Sex: Female) Treating ROBSON, MICHAEL Primary Care Provider: Joen [name] Provider/Extender: G Referring Provider: Tedra Senegal in Treatment: 35 Verbal / Phone Orders: Yes Clinician: Ashok Cordia, Debi Read Back and Verified: Yes Diagnosis Coding Wound Cleansing Wound #3 Right Gluteal fold o Clean wound with Normal Saline. o Cleanse wound with mild soap and water Anesthetic Wound #3 Right Gluteal fold o Topical Lidocaine 4% cream applied to wound bed prior to debridement - clinic use only Skin Barriers/Peri-Wound Care Wound #3 Right Gluteal fold o Skin Prep o Antifungal powder-Nystatin - Nystatin Powder to skin then gauze under drape of wound vac Primary Wound Dressing Wound #3 Right Gluteal fold o Dry Gauze - over the nystatin powder o Prisma Ag - (silver collagen) moisten with saline place in wound all the way  to wound bed under wound vac then place the white foam Dressing Change Frequency Wound #3 Right Gluteal fold o Three times weekly - HHRN to change MONDAY, and FRIDAY and pt to come to wound clinic on [facility], every other week. Follow-up Appointments Wound #3 Right Gluteal fold o Return Appointment in 2 weeks. Off-Loading Wound #3 Right Gluteal fold o Roho cushion for wheelchair - *********[facility] to order*********** Salman, [name] ([id number]) Home Health Wound #3 Right Gluteal fold o Continue Home Health Visits - [facility] *****Please order [facility] cushion****** o Home Health Nurse may visit PRN to address patientos wound care needs. o FACE TO FACE ENCOUNTER: MEDICARE and MEDICAID PATIENTS: I certify that this patient is under my care and that I had a face-to-face encounter that meets the physician face-to-face encounter requirements with this patient on this date. The  encounter with the patient was in whole or in part for the following MEDICAL CONDITION: (primary reason for Home Healthcare) MEDICAL NECESSITY: I certify, that based on my findings, NURSING services are a medically necessary home health service. HOME BOUND STATUS: I certify that my clinical findings support that this patient is homebound (i.e., Due to illness or injury, pt requires aid of supportive devices such as crutches, cane, wheelchairs, walkers, the use of special transportation or the assistance of another person to leave their place of residence. There is a normal inability to leave the home and doing so requires considerable and taxing effort. Other absences are for medical reasons / religious services and are infrequent or of short duration when for other reasons). o If current dressing causes regression in wound condition, may D/C ordered dressing product/s and apply Normal Saline Moist Dressing daily until next Wound Healing Center / Other MD appointment. Notify Wound Healing Center of regression in wound condition at [phone]. o Please direct any NON-WOUND related issues/requests for orders to patient's Primary Care Physician Negative Pressure Wound Therapy Wound #3 Right Gluteal fold o Wound VAC settings at 125/130 mmHg continuous pressure. Use BLACK/GREEN foam to wound cavity. Use WHITE foam to fill any tunnel/s and/or undermining. Change VAC dressing 3 X WEEK. Change canister as indicated when full. Nurse may titrate settings and frequency of dressing changes as clinically indicated. - Prisma Ag (silver collagen) moisten with saline place in wound all the way to wound bed under wound vac then white foam Nystatin Powder to skin then gauze under drape of wound vac o Home Health Nurse may d/c VAC for s/s of increased infection, significant wound regression, or uncontrolled drainage. Notify Wound Healing Center at [phone]. o Number of foam/gauze pieces used  in the dressing = Medications-please add to medication list. Wound #3 Right Gluteal fold o Other: - Vitamin C, Zinc, Multivitamins Nystatin Powder to skin then gauze under drape of wound vac Electronic Signature(s) Signed: 06/07/2017 4:50:29 PM By: Alejandro Mulling Signed: 06/07/2017 5:26:18 PM By: Baltazar Najjar MD Entered By: Alejandro Mulling on 06/07/2017 08:29:24 [name], [name] ([id number]) -------------------------------------------------------------------------------- Problem List Details Patient Name: [name] Date of Service: 06/07/2017 8:00 AM Medical Record Patient Account Number: [id number] Number: Treating RN: Riki Sheer 1961/07/16 (56 y.o. 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 09/21/2016 09/21/2016 severity Resolved Problems ICD-10 Code Description Active Date Resolved Date L89.323 Pressure ulcer of left buttock, stage 3 09/21/2016 09/21/2016 L89.324 Pressure ulcer of left buttock, stage 4 11/16/2016 11/16/2016 Electronic Signature(s) Signed: 06/07/2017 5:26:18 PM By: Baltazar Najjar MD [name], [name] ([id number]) Entered By: Baltazar Najjar on 06/07/2017 08:55:46 [name], [name] -------------------------------------------------------------------------------- Progress Note Details Patient Name: [name] Date of Service: 06/07/2017 8:00 AM Medical Record Patient Account Number:  [id number] Number: Treating RN: Riki Sheer 03/24/1961 (56 y.o. Other Clinician: Date of Birth/Sex: Female) Treating ROBSON, MICHAEL Primary Care Provider: Joen [name] Provider/Extender: G Referring Provider: Tedra Sen[place] in Treatment: 37 Subjective Chief Complaint Information obtained from Patient Patient is here for follow-up of a relation of her right ischial pressure ulcer History of Present Illness (HPI) The following HPI elements were documented for the patient's wound: Location: bilateral gluteal ulcerations and left popliteal ulceration with tunneling Quality: adits to intermittent aching to ischial ulcers, no discomfort to sinus tract Severity: right iscial ulcer with increased depth Duration: chronic ulcers to bilateral ischial ulcers and sinus tract Timing: The pain is intermittent in severity as far as how intense it becomes but is present all the time. Manipulationn makes this worse. Context: The wound occurred when the patient had a fall and was unconscious for about 48 hours laying on the floor and she had pressure injury at that stage. Modifying Factors: Other treatment(s) tried include:as noted below she has been seen by visiting wound care physicians or nurse practitioners and details have been noted Associated Signs and Symptoms: has yet to receive offloading cushions and mattress overlay 56 year old patient who was seen by visiting [name] wound care specialist for a wound on both her buttock and was found to have an unstageable wound on the right buttock for about 2 months. I understand that she had a fall and was laying on the floor for about 48 hours before she was found and taken to the ICU and had a long injury to her gluteal area from pressure and also had broken her right humerus. She has had a right proximal humerus fracture and has been followed up with orthopedics recently. The patient has a past medical history of type 2 diabetes  mellitus, paraparesis, acute pyelonephritis, GERD, hypertension, glaucoma, chronic pain, anxiety neurosis, nicotine dependence, COPD. the patient had some debridement done and was to operative was recommended to use Silvadene dressing and offloading. She is a smoker and occasionally smokes a few cigarettes. the patient requested a second opinion for months and is here to discuss her care. 09/21/16; the patient re-presents from home today for review of 3 different wounds. I note that she was seen in the clinic here in July at which time she had bilateral buttock wounds. It was apparently suggested at that time that she use a wound VAC bridged to both wounds just near the initial tuberosity's bilaterally which she refused. The history was a bit difficult to put together. Apparently this patient became ill at the end of April of this KHALESSI, BLOUGH. ([id number]) year. She was found sitting on the floor she had apparently been for 2 days and subsequently admitted to hospital from 04/23/16 through 05/02/16 and at that point she was critically ill ultimately having sepsis secondary to UTI, nontraumatic rhabdomyolysis and diabetic ketoacidosis. She had acute renal failure and I think required ICU  care including intubation. Patient states her wounds actually started at that point on the bilateral issue tuberosities however in reviewing the discharge summary from 5/8 I see no reference to wounds at that point. It did state that she had left lower extremity cellulitis however. Reviewing Epic I see no relevant x-rays. It would appear that her discharge creatinine was within the normal range and indeed on 9/15 her creatinine has remained normal. [facility] There the wounds on her bilateral Buttocks were dressed. Only just before her discharge from the nursing facility she developed an "knot" which was interpreted as cellulitis on the posterior aspect of  her left knee she was given antibiotics. Apparently sometime late in July a this actually opened and became a wound at home health care was tending to however she is still having purulent drainage coming from this and by my understanding the wound depth is actually become unmeasurable. I am not really clear about what home health has been placing in any of these wound areas. The patient states that is something with silver and it. She is not been systemically unwell no fever or chills her appetite is good. She is a diabetic poorly controlled however she states that her recent blood sugars at home have been in the low to mid 100s. 09/28/16 On evaluation today patient appears to continue to exhibit the 3 areas of ulceration that were noted previous. She did have an x-ray of the right pelvis which showed evidence of potential soft tissue infection but no obvious osteomyelitis. There was a discussion last office visit concerning the possibility of a wound VAC. Witth that being said the x-ray report suggested that an MRI may be more appropriate to further evaluate the extent. Subsequently in regard to the wound over the popliteal portion of the left lower extremity with tunneling at 12:00 the CT scan that was ordered was denied by insurance as they state the patient has not had x-rays prior to advanced imaging. Patient states that she is frustrated with the situation overall. 10/05/16 in the interval since I last saw this patient last week she has had the x-ray of the knee performed. I did review that x-ray today and fortunately shows no evidence of osteomyelitis or other acute abnormality at this point in time. She continues to have the opening iin the posterior left popliteal space with tracking proximal up the posterior thigh. Nothing seems to have worsened but it also seems to have not improved. The same is true in regard to the right pressure ulcer over the gluteal region which extends toward  the ischium. The left gluteal pressure ulcer actually appears to be doing somewhat better my opinion there is some necrotic slough but overall this appears fairly well. She tells me thatt she has some discomfort especially when home health is helping her with dressing changes as they do not know her. At worse she rates her pain to be a 5 out of 10 right now it's more like a 1 out of 10. 10/07/16; still the patient has 3 different wound areas. She has a deep stage IV wound over her right ischial tuberosity. She is due to have an MRI next week. The wound over her left ischial tuberosity is more superficial and underwent debridement today. Finally she has a small open area in her left popliteal fossa the probes on measurably forward superiorly. Still a lot of drainage coming out of this. The CT scan that I  ordered 3 weeks ago was questioned by her insurance company wanting a plain x-ray first. As I understand things result of this is nothing has been done in 3 weeks in terms of imaging the thigh and she has an MRI booked of this along with her pelvis for next week line 10/12/16; patient has a deep probing wound over the right ischiall tuberosity, stage III wound over the left visual tuberosity and a draining sinus in her left popliteal fossa. None of this much different from when I saw this 3 weeks ago. We have been using silver rope to the right ischial wound and a draining area in the left popliteal fossa. Plain silver alginate to the area on the left ischial tuberosity 10/19/16; the patient's wounds are essentially unchanged although the area on the left lower gluteal is actually improved. Our intake nurse noted drainage from the right initial tuberosity probing wound as well as the draining area in the left popliteal fossa. Both of these were cultured. She had x-rays I think at the Babb, JURY CASERTA. ([id number]) insistence of her insurance company on 09/23/16 x-ray of the pelvis was not particularly  helpful she did have soft tissue air over the right lower pelvis although with the depth of this wound this is not surprising. An x-ray of her left knee did not show any specific abnormalities. We are still using silver alginate to these wound areas. Her MRI is booked for 10/27 10/26/16; cultures of the purulent drainage in her right initial tuberosity wound grew moderate Proteus and few staph aureus. The same organisms were cultured out of the left knee sinus tract posteriorly. The staph aureus is MRSA. I had started her on Augmentin last week I added doxycycline. The MRI of the left lower extremity and pelvis was finally done. The MRI of the femur showed subcutaneous soft tissue swelling edema fluid and myositis in the vastus lateralis muscle but no soft tissue abscess septic arthritis or osteomyelitis. MRI of the pelvis showed the left wound to be more expensive extending down to the bone there was osteomyelitis. Left hamstring tendons were also involved. No septic arthritis involving the hip. The decubitus ulcer on the right side showed no definite osteomyelitis or abscess.. The right hip wound is actually the one the probes 6 cm downward. But the MRI showing infection including osteomyelitis on the left explains the draining sinus in the popliteal fossa on the left. She did have antibiotics in the hospitalization last time and this extended into her nursing home stay but I'm not exactly sure what antibiotics and for what duration. According the patient this did include vancomycin with considerable effort of our staff we are able to get the patient into see Dr. Sampson Goon today. There were transportation difficulties. Her mother had open heart surgery and is in the ICU in [place] therefore her brother was unable to transport. Dr. Jarrett Ables office graciously arranged time to see her today. From my point of view she is going to require IV vancomycin plus perhaps a third generation  cephalosporin. I plan to keep her on doxycycline and Augmentin until the IV antibiotics can be arranged. 11/02/2016 - Leila presents today for management of ulcers; She saw Dr. 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 She is to have the PICC line placed today and receive the initial dose for both antibiotics today. She has yet to receive the offloading chair cushion and/or mattress overlay from home  health, apparently this has been a 3 week process. I encouraged her to speak to home health regarding this matter, along with offering home health to contact the wouns care center with any questions or concerns. The left ischial pressure ulcer continues to imporve, while to right ischial ulcer has increased in depth. The popliteal fossa sinus tract remains unmeasurable due to the limitation of depth measurement (tract extends beyond our measuring devices). 11-16-2016 Ms. Akkerman presents today for evaluation and management of bilateral ischial stage IV pressure ulcers and sinus tract to the left popliteal fossa. she is under the care of Dr. Sampson Goon for IV antibiotic therapy; she states that the vancomycin was placed on hold and will be restarted at a lower dose based on her renal function. She continues taking Zosyn in addition to the vancomycin. She also states that she has yet to receive offloading cushions from home health, according to her she does not qualify for these offloading cushions because "the ulcers are unstageable ". We will contact the home health agency today to lend clarity regarding her pressure ulcers. The left popliteal fossa sinus tract continues to be a measurable as it extends beyond the length of our measuring devices.. 11/30/16; the patient is still on vancomycin and Zosyn. The depth of the draining sinus behind her left popliteal fossa is down to 4 cm although there is still  serosanguineous drainage coming out of this. She saw Dr. Sampson Goon of infectious disease yesterday the idea is to weeks more of IV antibiotics and then oral antibiotics although I have not read his note. The area on the left gluteal fold is just about healed. She has a 6 cm draining sinus over the right initial tuberosity although I cannot feel bone at the base of this. As far as the patient is aware she has not had a recheck of her inflammatory markers. 12/07/16; patient is on vancomycin and Zosyn appointment with Dr. Sampson Goon on the 19th at which point the patient expects to have a change in antibiotics. Remarkable improvement over the wound over the left ischial tuberosity which is just about closed. The draining sinus in her popliteal fossa has 0.4 cm in depth. The area on the right ischial tuberosity still probes down 7 cm. This is closed and overall wound dimensions [name], [name] ([id number]) but not depth. 12/14/16; patient is completing her vancomycin and Zosyn and per her she is going to transition to Bactrim and Augmentin for another 3 weeks. The area on her left gluteal fold is closed except for some skin tears. The area behind her left knee is no longer has any depth. The only remaining area that is of clinical concern is on the right gluteal fold probing towards the right ischial tuberosity. Today this measures 6.9 cm in depth. Very gritty surface 12/21/16; patient is now on Bactrim and Augmentin as directed by infectious disease. This should be for another 2 weeks. The area in her left gluteal fold and left popliteal are closed over and fully healed. Measurements today at 7 cm in the right buttock wound is unchanged from last week. 12/27/16; patient is on Bactrim and Augmentin for another week as directed by infectious disease. She has completed her IV antibiotics. She is not been systemically unwell no fever no chills. The area on her right buttock measured over 6 cm in depth.  There is no palpable bone. No evidence of surrounding soft tissue infection. She is complaining of tongue irritation and has a history of thrush  01/04/17; patient is been back to see Dr. Sampson Goon, her Augmentin was stopped but he continued the Bactrim for another 3 weeks. Depth of the wound is 6.7 cm there is been no major change in either direction. She is not receive the wound VAC from home health I think because of confusion about who is supposed to provided will actually talk to the home health agency today [[facility] The net no major change. 01/18/17; patient obtained her wound VAC about 10 days ago however for some reason it was not actually put on the wound. She is therefore here for [place] to apply this I guess. No other issues are noted. She is not complaining of pain fever drainage 02/01/17; patient is here now having the wound VAC or 3-4 weeks to a deep pressure area over the right initial tuberosity. This measures 6 cm in depth today which is about half a centimeter better than 2 weeks ago. There is no evidence she is systemically unwell no fever no chills no pain around the area. 02/15/17; I follow this patient every 2 weeks for a deep area over the right ischial tuberosity. This measures 5.5 cm today which is a continued improvement of 1.2 cm from 1/10 and down 0.5 cm from her visit 2 weeks ago 03/01/17; continued difficult area over the right initial tuberosity using the wound VAC. Depth today of 5.1 cm which is improved. Does not appear to be a lot of drainage in the canister. Antibiotics were finally stopped by Dr. Sampson Goon bactrim[]  and inflammatory markers have been repeated 03/15/17; fall this lady every 2 weeks for a difficult area over her lower right gluteal area/ischial tuberosity. Depth today at 4.6 cm. This is a slow but steady improvement in the depth of this wound. Although we have labeled this as a pressure ulcer there may have been an underlying infection here at one point  before we saw her. She had osteomyelitis on the left extensively which is since resolved 03/29/17- patient is here for follow-up evaluation of her right ischial pressure ulcer. She continues with the wound VAC and home health. According to the nurse home health has been using less foams and appropriate and we will instruct accordingly. The patient continues to smoke, approximately 10 cigarettes a day. She has been advised to decrease that in half by her next appointment, with a goal of complete cessation. She states her blood sugars have been consistently less than 150. She states that she spends most of her day position left lateral or prone. She does have an air mattress on her bed, she does not have an mattress for her chair, she states she cannot afford this. 04/12/17; patient is here for evaluation of her right ischial pressure ulcer. We continue to use a wound VAC with minimal improvement today the depth of this measuring 4.4 cm versus 4.6 cm 2 weeks ago. We are using a [employer] wound VAC on this area. There is not excessive drainage no pain. The patient tells me she tries to keep off this in bed but is up in the wheelchair for 2 hours a day. She is limited in no her overall ambulation but is improving and apparently is getting bilateral lower extremity braces which she hopes will improve her ability to walk independently. 05/10/17; Depth at 3.3cm. Improved 05/24/17; depth at 3.5 cm. This is not improved since last time. Not clear if they are using collagen under the foam 06/07/17; depth that 2.9 which is a slight improvement. Still using the  wound VAC. [name], [name] ([id number]) Objective Constitutional Sitting or standing Blood Pressure is within target range for patient.. Pulse regular and within target range for patient.Marland Kitchen Respirations regular, non-labored and within target range.. Temperature is normal and within the target range for the patient.Marland Kitchen appears in no distress. Vitals Time Taken:  8:08 AM, Height: 63 in, Weight: 257 lbs, BMI: 45.5, Temperature: 98.5 F, Pulse: 97 bpm, Respiratory Rate: 18 breaths/min, Blood Pressure: 141/78 mmHg. Eyes Conjunctivae clear. No discharge. Respiratory Respiratory effort is easy and symmetric bilaterally. Rate is normal at rest and on room air.. General Notes: Wound exam; right buttock/initial tuberosity area. Depth of 2.9 cm slightly better. Again covered with a very adherent surface very difficult to debride. There is no evidence of infection although the patient finds the area irritated probably because of the VAC draped Integumentary (Hair, Skin) Wound #3 status is Open. Original cause of wound was Pressure Injury. The wound is located on the Right Gluteal fold. The wound measures 1cm length x 1.7cm width x 2.9cm depth; 1.335cm^2 area and 3.872cm^3 volume. There is Fat Layer (Subcutaneous Tissue) Exposed exposed. There is no tunneling or undermining noted. There is a large amount of serous drainage noted. The wound margin is distinct with the outline attached to the wound base. There is medium (34-66%) red granulation within the wound bed. There is a medium (34-66%) amount of necrotic tissue within the wound bed including Adherent Slough. The periwound skin appearance exhibited: Scarring, Maceration. The periwound skin appearance did not exhibit: Callus, Crepitus, Excoriation, Induration, Rash, Dry/Scaly, Atrophie Blanche, Cyanosis, Ecchymosis, Hemosiderin Staining, Mottled, Pallor, Rubor, Erythema. Periwound temperature was noted as No Abnormality. The periwound has tenderness on palpation. Assessment Active Problems ICD-10 L89.314 - Pressure ulcer of right buttock, stage 4 E11.42 - Type 2 diabetes mellitus with diabetic polyneuropathy [name], Tanice L. ([id number]) Procedures Wound #3 Pre-procedure diagnosis of Wound #3 is a Pressure Ulcer located on the Right Gluteal fold . There was a Skin/Subcutaneous Tissue Debridement  ([id number]) debridement with total area of 1.7 sq cm performed by Maxwell Caul, MD. with the following instrument(s): Curette and scoop to remove Viable and Non-Viable tissue/material including Exudate, Fibrin/Slough, and Subcutaneous after achieving pain control using Lidocaine 4% Topical Solution. A time out was conducted at 08:23, prior to the start of the procedure. A Minimum amount of bleeding was controlled with Pressure. The procedure was tolerated well with a pain level of 0 throughout and a pain level of 0 following the procedure. Post Debridement Measurements: 1cm length x 1.7cm width x 2.9cm depth; 3.872cm^3 volume. Post debridement Stage noted as Category/Stage IV. Character of Wound/Ulcer Post Debridement requires further debridement. Post procedure Diagnosis Wound #3: Same as Pre-Procedure Plan Wound Cleansing: Wound #3 Right Gluteal fold: Clean wound with Normal Saline. Cleanse wound with mild soap and water Anesthetic: Wound #3 Right Gluteal fold: Topical Lidocaine 4% cream applied to wound bed prior to debridement - clinic use only Skin Barriers/Peri-Wound Care: Wound #3 Right Gluteal fold: Skin Prep Antifungal powder-Nystatin - Nystatin Powder to skin then gauze under drape of wound vac Primary Wound Dressing: Wound #3 Right Gluteal fold: Dry Gauze - over the nystatin powder Prisma Ag - (silver collagen) moisten with saline place in wound all the way to wound bed under wound vac then place the white foam Dressing Change Frequency: Wound #3 Right Gluteal fold: Three times weekly - HHRN to change MONDAY, and FRIDAY and pt to come to wound clinic on [facility], every other week. Follow-up Appointments:  Wound #3 Right Gluteal fold: [place], Verma L. ([id number]) Return Appointment in 2 weeks. Off-Loading: Wound #3 Right Gluteal fold: Roho cushion for wheelchair - *********[facility] to order*********** Home Health: Wound #3 Right Gluteal fold: Continue  Home Health Visits - [facility] *****Please order [facility] cushion****** Home Health Nurse may visit PRN to address patient s wound care needs. FACE TO FACE ENCOUNTER: MEDICARE and MEDICAID PATIENTS: I certify that this patient is under my care and that I had a face-to-face encounter that meets the physician face-to-face encounter requirements with this patient on this date. The encounter with the patient was in whole or in part for the following MEDICAL CONDITION: (primary reason for Home Healthcare) MEDICAL NECESSITY: I certify, that based on my findings, NURSING services are a medically necessary home health service. HOME BOUND STATUS: I certify that my clinical findings support that this patient is homebound (i.e., Due to illness or injury, pt requires aid of supportive devices such as crutches, cane, wheelchairs, walkers, the use of special transportation or the assistance of another person to leave their place of residence. There is a normal inability to leave the home and doing so requires considerable and taxing effort. Other absences are for medical reasons / religious services and are infrequent or of short duration when for other reasons). If current dressing causes regression in wound condition, may D/C ordered dressing product/s and apply Normal Saline Moist Dressing daily until next Wound Healing Center / Other MD appointment. Notify Wound Healing Center of regression in wound condition at [phone]. Please direct any NON-WOUND related issues/requests for orders to patient's Primary Care Physician Negative Pressure Wound Therapy: Wound #3 Right Gluteal fold: Wound VAC settings at 125/130 mmHg continuous pressure. Use BLACK/GREEN foam to wound cavity. Use WHITE foam to fill any tunnel/s and/or undermining. Change VAC dressing 3 X WEEK. Change canister as indicated when full. Nurse may titrate settings and frequency of dressing changes as clinically indicated. - Prisma  Ag (silver collagen) moisten with saline place in wound all the way to wound bed under wound vac then white foam Nystatin Powder to skin then gauze under drape of wound vac Home Health Nurse may d/c VAC for s/s of increased infection, significant wound regression, or uncontrolled drainage. Notify Wound Healing Center at [phone]. Number of foam/gauze pieces used in the dressing = Medications-please add to medication list.: Wound #3 Right Gluteal fold: Other: - Vitamin C, Zinc, Multivitamins Nystatin Powder to skin then gauze under drape of wound vac #1 we continued with the wound VAC with collagen under the foam dimension slightly better today Electronic Signature(s) Signed: 06/07/2017 5:26:18 PM By: Baltazar Najjar MD Entered By: Baltazar Najjar on 06/07/2017 09:00:53 Shomaker, [name] ([id number]) -------------------------------------------------------------------------------- SuperBill Details Patient Name: [name] Date of Service: 06/07/2017 Medical Record Patient Account Number: [id number] Number: Treating RN: Riki Sheer 1961/01/29 (56 y.o. 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

## 2017-06-23 NOTE — Progress Notes (Signed)
[name], [name] ([id number]) Visit Report for 06/21/2017 Chief Complaint Document Details Patient Name: [name], [name] Date of Service: 06/21/2017 8:00 [name] Medical Record Patient Account Number: [id number] Number: Treating RN: Phillis Haggis 13-Feb-1961 (56 y.o. Other Clinician: Date of Birth/Sex: Female) Treating Arvil Utz Primary Care Provider: Joen [name] Provider/Extender: G Referring Provider: Tedra Senegal in Treatment: 39 Information Obtained from: Patient Chief Complaint Patient is here for follow-up of a relation of her right ischial pressure ulcer Electronic Signature(s) Signed: 06/21/2017 5:06:34 PM By: Baltazar Najjar MD Entered By: Baltazar Najjar on 06/21/2017 08:39:28 Buccheri, [name] ([id number]) -------------------------------------------------------------------------------- Debridement Details Patient Name: [name] Date of Service: 06/21/2017 8:00 [name] Medical Record Patient Account Number: [id number] Number: Treating RN: Phillis Haggis 22-Jan-1961 (56 y.o. Other Clinician: Date of Birth/Sex: Female) Treating Kierra Jezewski Primary Care Provider: Joen [name] Provider/Extender: G Referring Provider: Tedra Senegal in Treatment: 39 Debridement Performed for Wound #3 Right Gluteal fold Assessment: Performed By: Physician Maxwell Caul, MD Debridement: Debridement Pre-procedure Verification/Time Out Yes - 08:27 Taken: Start Time: 08:28 Pain Control: Lidocaine 4% Topical Solution Level: Skin/Subcutaneous Tissue Total Area Debrided (L x 1 (cm) x 1.4 (cm) = 1.4 (cm) W): Tissue and other Viable, Non-Viable, Exudate, Fibrin/Slough, Subcutaneous material debrided: Instrument: Other : scoop Bleeding: Minimum Hemostasis Achieved: Pressure End Time: 08:32 Procedural Pain: 0 Post Procedural Pain: 0 Response to Treatment: Procedure was tolerated well Post Debridement Measurements of Total Wound Length: (cm)  1 Stage: Category/Stage IV Width: (cm) 1.4 Depth: (cm) 2.9 Volume: (cm) 3.189 Character of Wound/Ulcer Post Requires Further Debridement: Debridement Post Procedure Diagnosis Same as Pre-procedure Electronic Signature(s) Signed: 06/21/2017 4:41:02 PM By: Alejandro Mulling Signed: 06/21/2017 5:06:34 PM By: Baltazar Najjar MD Entered By: Alejandro Mulling on 06/21/2017 08:52:08 [name], [name] ([id number]) [name], [name] ([id number]) -------------------------------------------------------------------------------- HPI Details Patient Name: [name] Date of Service: 06/21/2017 8:00 [name] Medical Record Patient Account Number: [id number] Number: Treating RN: Phillis Haggis Dec 30, 1960 (56 y.o. Other Clinician: Date of Birth/Sex: Female) Treating Cyree Chuong Primary Care Provider: Joen [name] Provider/Extender: G Referring Provider: Tedra Senegal in Treatment: 64 History of Present Illness Location: bilateral gluteal ulcerations and left popliteal ulceration with tunneling Quality: adits to intermittent aching to ischial ulcers, no discomfort to sinus tract Severity: right iscial ulcer with increased depth Duration: chronic ulcers to bilateral ischial ulcers and sinus tract Timing: The pain is intermittent in severity as far as how intense it becomes but is present all the time. Manipulationn makes this worse. Context: The wound occurred when the patient had a fall and was unconscious for about 48 hours laying on the floor and she had pressure injury at that stage. Modifying Factors: Other treatment(s) tried include:as noted below she has been seen by visiting wound care physicians or nurse practitioners and details have been noted Associated Signs and Symptoms: has yet to receive offloading cushions and mattress overlay HPI Description: 56 year old patient who was seen by visiting [name] wound care specialist for a wound on both her buttock and was found to have an  unstageable wound on the right buttock for about 2 months. I understand that she had a fall and was laying on the floor for about 48 hours before she was found and taken to the ICU and had a long injury to her gluteal area from pressure and also had broken her right humerus. She has had a right proximal humerus fracture and has been followed up with orthopedics recently. The patient has a  past medical history of type 2 diabetes mellitus, paraparesis, acute pyelonephritis, GERD, hypertension, glaucoma, chronic pain, anxiety neurosis, nicotine dependence, COPD. the patient had some debridement done and was to operative was recommended to use Silvadene dressing and offloading. She is a smoker and occasionally smokes a few cigarettes. the patient requested a second opinion for months and is here to discuss her care. 09/21/16; the patient re-presents from home today for review of 3 different wounds. I note that she was seen in the clinic here in July at which time she had bilateral buttock wounds. It was apparently suggested at that time that she use a wound VAC bridged to both wounds just near the initial tuberosity's bilaterally which she refused. The history was a bit difficult to put together. Apparently this patient became ill at the end of April of this year. She was found sitting on the floor she had apparently been for 2 days and subsequently admitted to hospital from 04/23/16 through 05/02/16 and at that point she was critically ill ultimately having sepsis secondary to UTI, nontraumatic rhabdomyolysis and diabetic ketoacidosis. She had acute renal failure and I think required ICU care including intubation. Patient states her wounds actually started at that point on the bilateral issue tuberosities however in reviewing the discharge summary from 5/8 I see no reference to wounds at that point. It did state that she had left lower extremity cellulitis however. Reviewing Epic I see no relevant  x-rays. It would appear that her discharge creatinine was within the normal range and indeed on 9/15 her creatinine has remained normal. [facility]. ([id number]) rehabilitation. There the wounds on her bilateral Buttocks were dressed. Only just before her discharge from the nursing facility she developed an "knot" which was interpreted as cellulitis on the posterior aspect of her left knee she was given antibiotics. Apparently sometime late in July a this actually opened and became a wound at home health care was tending to however she is still having purulent drainage coming from this and by my understanding the wound depth is actually become unmeasurable. I [name] not really clear about what home health has been placing in any of these wound areas. The patient states that is something with silver and it. She is not been systemically unwell no fever or chills her appetite is good. She is a diabetic poorly controlled however she states that her recent blood sugars at home have been in the low to mid 100s. 09/28/16 On evaluation today patient appears to continue to exhibit the 3 areas of ulceration that were noted previous. She did have an x-ray of the right pelvis which showed evidence of potential soft tissue infection but no obvious osteomyelitis. There was a discussion last office visit concerning the possibility of a wound VAC. Witth that being said the x-ray report suggested that an MRI may be more appropriate to further evaluate the extent. Subsequently in regard to the wound over the popliteal portion of the left lower extremity with tunneling at 12:00 the CT scan that was ordered was denied by insurance as they state the patient has not had x-rays prior to advanced imaging. Patient states that she is frustrated with the situation overall. 10/05/16 in the interval since I last saw this patient last week she has had the x-ray of the knee  performed. I did review that x-ray today and fortunately shows no evidence of osteomyelitis or other acute abnormality at this point in  time. She continues to have the opening iin the posterior left popliteal space with tracking proximal up the posterior thigh. Nothing seems to have worsened but it also seems to have not improved. The same is true in regard to the right pressure ulcer over the gluteal region which extends toward the ischium. The left gluteal pressure ulcer actually appears to be doing somewhat better my opinion there is some necrotic slough but overall this appears fairly well. She tells me thatt she has some discomfort especially when home health is helping her with dressing changes as they do not know her. At worse she rates her pain to be a 5 out of 10 right now it's more like a 1 out of 10. 10/07/16; still the patient has 3 different wound areas. She has a deep stage IV wound over her right ischial tuberosity. She is due to have an MRI next week. The wound over her left ischial tuberosity is more superficial and underwent debridement today. Finally she has a small open area in her left popliteal fossa the probes on measurably forward superiorly. Still a lot of drainage coming out of this. The CT scan that I ordered 3 weeks ago was questioned by her insurance company wanting a plain x-ray first. As I understand things result of this is nothing has been done in 3 weeks in terms of imaging the thigh and she has an MRI booked of this along with her pelvis for next week line 10/12/16; patient has a deep probing wound over the right ischiall tuberosity, stage III wound over the left visual tuberosity and a draining sinus in her left popliteal fossa. None of this much different from when I saw this 3 weeks ago. We have been using silver rope to the right ischial wound and a draining area in the left popliteal fossa. Plain silver alginate to the area on the left ischial  tuberosity 10/19/16; the patient's wounds are essentially unchanged although the area on the left lower gluteal is actually improved. Our intake nurse noted drainage from the right initial tuberosity probing wound as well as the draining area in the left popliteal fossa. Both of these were cultured. She had x-rays I think at the insistence of her insurance company on 09/23/16 x-ray of the pelvis was not particularly helpful she did have soft tissue air over the right lower pelvis although with the depth of this wound this is not surprising. An x-ray of her left knee did not show any specific abnormalities. We are still using silver alginate to these wound areas. Her MRI is booked for 10/27 10/26/16; cultures of the purulent drainage in her right initial tuberosity wound grew moderate Proteus and few staph aureus. The same organisms were cultured out of the left knee sinus tract posteriorly. The staph aureus is MRSA. I had started her on Augmentin last week I added doxycycline. The MRI of the left lower extremity and pelvis was finally done. The MRI of the femur showed subcutaneous soft tissue swelling Gipe, Kiyo L. ([id number]) edema fluid and myositis in the vastus lateralis muscle but no soft tissue abscess septic arthritis or osteomyelitis. MRI of the pelvis showed the left wound to be more expensive extending down to the bone there was osteomyelitis. Left hamstring tendons were also involved. No septic arthritis involving the hip. The decubitus ulcer on the right side showed no definite osteomyelitis or abscess.. The right hip wound is actually the one the probes 6 cm downward. But the MRI showing infection  including osteomyelitis on the left explains the draining sinus in the popliteal fossa on the left. She did have antibiotics in the hospitalization last time and this extended into her nursing home stay but I'm not exactly sure what antibiotics and for what duration. According the patient  this did include vancomycin with considerable effort of our staff we are able to get the patient into see Dr. Sampson Goon today. There were transportation difficulties. Her mother had open heart surgery and is in the ICU in [place] therefore her brother was unable to transport. Dr. Jarrett Ables office graciously arranged time to see her today. From my point of view she is going to require IV vancomycin plus perhaps a third generation cephalosporin. I plan to keep her on doxycycline and Augmentin until the IV antibiotics can be arranged. 11/02/2016 - Brailey presents today for management of ulcers; She saw Dr. 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 She is to have the PICC line placed today and receive the initial dose for both antibiotics today. She has yet to receive the offloading chair cushion and/or mattress overlay from home health, apparently this has been a 3 week process. I encouraged her to speak to home health regarding this matter, along with offering home health to contact the wouns care center with any questions or concerns. The left ischial pressure ulcer continues to imporve, while to right ischial ulcer has increased in depth. The popliteal fossa sinus tract remains unmeasurable due to the limitation of depth measurement (tract extends beyond our measuring devices). 11-16-2016 Ms. Burgen presents today for evaluation and management of bilateral ischial stage IV pressure ulcers and sinus tract to the left popliteal fossa. she is under the care of Dr. Sampson Goon for IV antibiotic therapy; she states that the vancomycin was placed on hold and will be restarted at a lower dose based on her renal function. She continues taking Zosyn in addition to the vancomycin. She also states that she has yet to receive offloading cushions from home health, according to her she does not qualify for  these offloading cushions because "the ulcers are unstageable ". We will contact the home health agency today to lend clarity regarding her pressure ulcers. The left popliteal fossa sinus tract continues to be a measurable as it extends beyond the length of our measuring devices.. 11/30/16; the patient is still on vancomycin and Zosyn. The depth of the draining sinus behind her left popliteal fossa is down to 4 cm although there is still serosanguineous drainage coming out of this. She saw Dr. Sampson Goon of infectious disease yesterday the idea is to weeks more of IV antibiotics and then oral antibiotics although I have not read his note. The area on the left gluteal fold is just about healed. She has a 6 cm draining sinus over the right initial tuberosity although I cannot feel bone at the base of this. As far as the patient is aware she has not had a recheck of her inflammatory markers. 12/07/16; patient is on vancomycin and Zosyn appointment with Dr. Sampson Goon on the 19th at which point the patient expects to have a change in antibiotics. Remarkable improvement over the wound over the left ischial tuberosity which is just about closed. The draining sinus in her popliteal fossa has 0.4 cm in depth. The area on the right ischial tuberosity still probes down 7 cm. This is closed and overall wound dimensions but not depth. 12/14/16; patient is  completing her vancomycin and Zosyn and per her she is going to transition to Bactrim and Augmentin for another 3 weeks. The area on her left gluteal fold is closed except for some skin tears. The area behind her left knee is no longer has any depth. The only remaining area that is of clinical concern is on the right gluteal fold probing towards the right ischial tuberosity. Today this measures 6.9 cm in depth. Very gritty surface 12/21/16; patient is now on Bactrim and Augmentin as directed by infectious disease. This should be for another 2 weeks. The  area in her left gluteal fold and left popliteal are closed over and fully healed. Vandervoort, Stellah L. ([id number]) Measurements today at 7 cm in the right buttock wound is unchanged from last week. 12/27/16; patient is on Bactrim and Augmentin for another week as directed by infectious disease. She has completed her IV antibiotics. She is not been systemically unwell no fever no chills. The area on her right buttock measured over 6 cm in depth. There is no palpable bone. No evidence of surrounding soft tissue infection. She is complaining of tongue irritation and has a history of thrush 01/04/17; patient is been back to see Dr. Sampson Goon, her Augmentin was stopped but he continued the Bactrim for another 3 weeks. Depth of the wound is 6.7 cm there is been no major change in either direction. She is not receive the wound VAC from home health I think because of confusion about who is supposed to provided will actually talk to the [facility] agency [facility] The net no major change. 01/18/17; patient obtained her wound VAC about 10 days ago however for some reason it was not actually put on the wound. She is therefore here for [place] to apply this I guess. No other issues are noted. She is not complaining of pain fever drainage 02/01/17; patient is here now having the wound VAC or 3-4 weeks to a deep pressure area over the right initial tuberosity. This measures 6 cm in depth today which is about half a centimeter better than 2 weeks ago. There is no evidence she is systemically unwell no fever no chills no pain around the area. 02/15/17; I follow this patient every 2 weeks for a deep area over the right ischial tuberosity. This measures 5.5 cm today which is a continued improvement of 1.2 cm from 1/10 and down 0.5 cm from her visit 2 weeks ago 03/01/17; continued difficult area over the right initial tuberosity using the wound VAC. Depth today of 5.1 cm which is improved. Does not appear to be a lot of  drainage in the canister. Antibiotics were finally stopped by Dr. Sampson Goon bactrim[]  and inflammatory markers have been repeated 03/15/17; fall this lady every 2 weeks for a difficult area over her lower right gluteal area/ischial tuberosity. Depth today at 4.6 cm. This is a slow but steady improvement in the depth of this wound. Although we have labeled this as a pressure ulcer there may have been an underlying infection here at one point before we saw her. She had osteomyelitis on the left extensively which is since resolved 03/29/17- patient is here for follow-up evaluation of her right ischial pressure ulcer. She continues with the wound VAC and home health. According to the nurse home health has been using less foams and appropriate and we will instruct accordingly. The patient continues to smoke, approximately 10 cigarettes a day. She has been advised to decrease that in half by her  next appointment, with a goal of complete cessation. She states her blood sugars have been consistently less than 150. She states that she spends most of her day position left lateral or prone. She does have an air mattress on her bed, she does not have an mattress for her chair, she states she cannot afford this. 04/12/17; patient is here for evaluation of her right ischial pressure ulcer. We continue to use a wound VAC with minimal improvement today the depth of this measuring 4.4 cm versus 4.6 cm 2 weeks ago. We are using a [employer] wound VAC on this area. There is not excessive drainage no pain. The patient tells me she tries to keep off this in bed but is up in the wheelchair for 2 hours a day. She is limited in no her overall ambulation but is improving and apparently is getting bilateral lower extremity braces which she hopes will improve her ability to walk independently. 05/10/17; Depth at 3.3cm. Improved 05/24/17; depth at 3.5 cm. This is not improved since last time. Not clear if they are using collagen under  the foam 06/07/17; depth that 2.9 which is a slight improvement. Still using the wound VAC. 06/21/17; depth is 2.9 cm which is exactly the same as last time. Also the appearance of this wound is completely the same. We have been using silver collagen under a wound VAC. Electronic Signature(s) Signed: 06/21/2017 5:06:34 PM By: Baltazar Najjar MD Entered By: Baltazar Najjar on 06/21/2017 09:11:40 Colberg, [name] ([id number]) [name], [name] ([id number]) -------------------------------------------------------------------------------- Physical Exam Details Patient Name: [name] Date of Service: 06/21/2017 8:00 [name] Medical Record Patient Account Number: [id number] Number: Treating RN: Phillis Haggis 1961-08-11 (56 y.o. Other Clinician: Date of Birth/Sex: Female) Treating Chaz Mcglasson Primary Care Provider: Joen [name] Provider/Extender: G Referring Provider: Tedra Senegal in Treatment: 39 Constitutional Sitting or standing Blood Pressure is within target range for patient.. Pulse regular and within target range for patient.Marland Kitchen Respirations regular, non-labored and within target range.. Temperature is normal and within the target range for the patient.Marland Kitchen appears in no distress. Notes Wound exam; the areas on the right buttock. Depth and condition of this wound at 2.9 cm completely the same as last time. Once again she has a very gritty nonviable surface over this wound. Using an open curet I aggressively debridement this area. There is no evidence of soft tissue erythema, crepitus or tenderness. She did not have a lot of drainage in the [facility] canister Electronic Signature(s) Signed: 06/21/2017 5:06:34 PM By: Baltazar Najjar MD Entered By: Baltazar Najjar on 06/21/2017 09:13:14 [name], [name] ([id number]) -------------------------------------------------------------------------------- Physician Orders Details Patient Name: [name] Date of Service: 06/21/2017 8:00  [name] Medical Record Patient Account Number: [id number] Number: Treating RN: Phillis Haggis [birth year] (56 y.o. Other Clinician: Date of Birth/Sex: Female) Treating Marque Rademaker Primary Care Provider: Joen [name] Provider/Extender: G Referring Provider: Tedra Senegal in Treatment: 69 Verbal / Phone Orders: No Diagnosis Coding ICD-10 Coding Code Description L89.314 Pressure ulcer of right buttock, stage 4 E11.42 Type 2 diabetes mellitus with diabetic polyneuropathy Wound Cleansing Wound #3 Right Gluteal fold o Clean wound with Normal Saline. o Cleanse wound with mild soap and water Anesthetic Wound #3 Right Gluteal fold o Topical Lidocaine 4% cream applied to wound bed prior to debridement - clinic use only Skin Barriers/Peri-Wound Care Wound #3 Right Gluteal fold o Skin Prep o Antifungal powder-Nystatin - Nystatin Powder to skin then gauze under drape of wound vac Primary Wound  Dressing Wound #3 Right Gluteal fold o Dry Gauze - over the nystatin powder o Prisma Ag - (silver collagen) moisten with saline place in wound all the way to wound bed under wound vac then place the white foam Dressing Change Frequency Wound #3 Right Gluteal fold o Three times weekly - HHRN to change MONDAY, and FRIDAY and pt to come to wound clinic on [facility], every other week. Follow-up Appointments Wound #3 Right Gluteal fold o Return Appointment in 2 weeks. Chancellor, Mansi L. ([id number]) Off-Loading Wound #3 Right Gluteal fold o Roho cushion for wheelchair - *********[facility] to order*********** Home Health Wound #3 Right Gluteal fold o Continue Home Health Visits - [facility] *****Please order ROHO cushion****** o Home Health Nurse may visit PRN to address patientos wound care needs. o FACE TO FACE ENCOUNTER: MEDICARE and MEDICAID PATIENTS: I certify that this patient is under my care and that I had a face-to-face encounter that  meets the physician face-to-face encounter requirements with this patient on this date. The encounter with the patient was in whole or in part for the following MEDICAL CONDITION: (primary reason for Home Healthcare) MEDICAL NECESSITY: I certify, that based on my findings, NURSING services are a medically necessary home health service. HOME BOUND STATUS: I certify that my clinical findings support that this patient is homebound (i.e., Due to illness or injury, pt requires aid of supportive devices such as crutches, cane, wheelchairs, walkers, the use of special transportation or the assistance of another person to leave their place of residence. There is a normal inability to leave the home and doing so requires considerable and taxing effort. Other absences are for medical reasons / religious services and are infrequent or of short duration when for other reasons). o If current dressing causes regression in wound condition, may D/C ordered dressing product/s and apply Normal Saline Moist Dressing daily until next Wound Healing Center / Other MD appointment. Notify Wound Healing Center of regression in wound condition at [phone]. o Please direct any NON-WOUND related issues/requests for orders to patient's Primary Care Physician Negative Pressure Wound Therapy Wound #3 Right Gluteal fold o Wound VAC settings at 125/130 mmHg continuous pressure. Use BLACK/GREEN foam to wound cavity. Use WHITE foam to fill any tunnel/s and/or undermining. Change VAC dressing 3 X WEEK. Change canister as indicated when full. Nurse may titrate settings and frequency of dressing changes as clinically indicated. - Prisma Ag (silver collagen) moisten with saline place in wound all the way to wound bed under wound vac then white foam Nystatin Powder to skin then gauze under drape of wound vac o Home Health Nurse may d/c VAC for s/s of increased infection, significant wound regression, or uncontrolled  drainage. Notify Wound Healing Center at [phone]. o Number of foam/gauze pieces used in the dressing = Medications-please add to medication list. Wound #3 Right Gluteal fold o Other: - Vitamin C, Zinc, Multivitamins Nystatin Powder to skin then gauze under drape of wound vac Electronic Signature(s) NAKYAH, ERDMANN ([id number]) Signed: 06/21/2017 4:41:02 PM By: Alejandro Mulling Signed: 06/21/2017 5:06:34 PM By: Baltazar Najjar MD Entered By: Alejandro Mulling on 06/21/2017 08:52:27 [name], [name] ([id number]) -------------------------------------------------------------------------------- Problem List Details Patient Name: [name] Date of Service: 06/21/2017 8:00 [name] Medical Record Patient Account Number: [id number] Number: Treating RN: Phillis Haggis Nov 13, 1961 (56 y.o. 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 09/21/2016 09/21/2016 severity Resolved Problems ICD-10 Code Description Active Date Resolved Date L89.323 Pressure ulcer of left buttock, stage 3 09/21/2016 09/21/2016 L89.324 Pressure ulcer of left buttock, stage 4 11/16/2016 11/16/2016 Electronic Signature(s) Signed: 06/21/2017 5:06:34 PM By: Baltazar Najjar MD [name], [name] ([id number]) Entered By: Baltazar Najjar on 06/21/2017 08:39:10 [name], [name] -------------------------------------------------------------------------------- Progress  Note Details Patient Name: [name] Date of Service: 06/21/2017 8:00 [name] Medical Record Patient Account Number: [id number] Number: Treating RN: Phillis Haggis 1960/12/31 (56 y.o. Other Clinician: Date of Birth/Sex: Female) Treating Allison Deshotels Primary Care Provider: Joen [name] Provider/Extender: G Referring Provider: Tedra Senegal in Treatment: 68 Subjective Chief Complaint Information obtained from Patient Patient is here for follow-up of a relation of her right ischial pressure ulcer History of Present Illness (HPI) The following HPI elements were documented for the patient's wound: Location: bilateral gluteal ulcerations and left popliteal ulceration with tunneling Quality: adits to intermittent aching to ischial ulcers, no discomfort to sinus tract Severity: right iscial ulcer with increased depth Duration: chronic ulcers to bilateral ischial ulcers and sinus tract Timing: The pain is intermittent in severity as far as how intense it becomes but is present all the time. Manipulationn makes this worse. Context: The wound occurred when the patient had a fall and was unconscious for about 48 hours laying on the floor and she had pressure injury at that stage. Modifying Factors: Other treatment(s) tried include:as noted below she has been seen by visiting wound care physicians or nurse practitioners and details have been noted Associated Signs and Symptoms: has yet to receive offloading cushions and mattress overlay 56 year old patient who was seen by visiting [name] wound care specialist for a wound on both her buttock and was found to have an unstageable wound on the right buttock for about 2 months. I understand that she had a fall and was laying on the floor for about 48 hours before she was found and taken to the ICU and had a long injury to her gluteal area from pressure and also had broken her right humerus. She has had a right proximal humerus  fracture and has been followed up with orthopedics recently. The patient has a past medical history of type 2 diabetes mellitus, paraparesis, acute pyelonephritis, GERD, hypertension, glaucoma, chronic pain, anxiety neurosis, nicotine dependence, COPD. the patient had some debridement done and was to operative was recommended to use Silvadene dressing and offloading. She is a smoker and occasionally smokes a few cigarettes. the patient requested a second opinion for months and is here to discuss her care. 09/21/16; the patient re-presents from home today for review of 3 different wounds. I note that she was seen in the clinic here in July at which time she had bilateral buttock wounds. It was apparently suggested at that time that she use a wound VAC bridged to both wounds just near the initial tuberosity's bilaterally which she refused. The history was a bit difficult to put together. Apparently this patient became ill at the end of April of this LARITZA, VOKES. ([id number]) year. She was found sitting on the floor she had apparently been for 2 days and subsequently admitted to hospital  from 04/23/16 through 05/02/16 and at that point she was critically ill ultimately having sepsis secondary to UTI, nontraumatic rhabdomyolysis and diabetic ketoacidosis. She had acute renal failure and I think required ICU care including intubation. Patient states her wounds actually started at that point on the bilateral issue tuberosities however in reviewing the discharge summary from 5/8 I see no reference to wounds at that point. It did state that she had left lower extremity cellulitis however. Reviewing Epic I see no relevant x-rays. It would appear that her discharge creatinine was within the normal range and indeed on 9/15 her creatinine has remained normal. [facility] There the wounds on her bilateral Buttocks were dressed. Only just before her  discharge from the nursing facility she developed an "knot" which was interpreted as cellulitis on the posterior aspect of her left knee she was given antibiotics. Apparently sometime late in July a this actually opened and became a wound at home health care was tending to however she is still having purulent drainage coming from this and by my understanding the wound depth is actually become unmeasurable. I [name] not really clear about what home health has been placing in any of these wound areas. The patient states that is something with silver and it. She is not been systemically unwell no fever or chills her appetite is good. She is a diabetic poorly controlled however she states that her recent blood sugars at home have been in the low to mid 100s. 09/28/16 On evaluation today patient appears to continue to exhibit the 3 areas of ulceration that were noted previous. She did have an x-ray of the right pelvis which showed evidence of potential soft tissue infection but no obvious osteomyelitis. There was a discussion last office visit concerning the possibility of a wound VAC. Witth that being said the x-ray report suggested that an MRI may be more appropriate to further evaluate the extent. Subsequently in regard to the wound over the popliteal portion of the left lower extremity with tunneling at 12:00 the CT scan that was ordered was denied by insurance as they state the patient has not had x-rays prior to advanced imaging. Patient states that she is frustrated with the situation overall. 10/05/16 in the interval since I last saw this patient last week she has had the x-ray of the knee performed. I did review that x-ray today and fortunately shows no evidence of osteomyelitis or other acute abnormality at this point in time. She continues to have the opening iin the posterior left popliteal space with tracking proximal up the posterior thigh. Nothing seems to have worsened but it also seems to  have not improved. The same is true in regard to the right pressure ulcer over the gluteal region which extends toward the ischium. The left gluteal pressure ulcer actually appears to be doing somewhat better my opinion there is some necrotic slough but overall this appears fairly well. She tells me thatt she has some discomfort especially when home health is helping her with dressing changes as they do not know her. At worse she rates her pain to be a 5 out of 10 right now it's more like a 1 out of 10. 10/07/16; still the patient has 3 different wound areas. She has a deep stage IV wound over her right ischial tuberosity. She is due to have an MRI next week. The wound over her left ischial tuberosity is more superficial and underwent debridement  today. Finally she has a small open area in her left popliteal fossa the probes on measurably forward superiorly. Still a lot of drainage coming out of this. The CT scan that I ordered 3 weeks ago was questioned by her insurance company wanting a plain x-ray first. As I understand things result of this is nothing has been done in 3 weeks in terms of imaging the thigh and she has an MRI booked of this along with her pelvis for next week line 10/12/16; patient has a deep probing wound over the right ischiall tuberosity, stage III wound over the left visual tuberosity and a draining sinus in her left popliteal fossa. None of this much different from when I saw this 3 weeks ago. We have been using silver rope to the right ischial wound and a draining area in the left popliteal fossa. Plain silver alginate to the area on the left ischial tuberosity 10/19/16; the patient's wounds are essentially unchanged although the area on the left lower gluteal is actually improved. Our intake nurse noted drainage from the right initial tuberosity probing wound as well as the draining area in the left popliteal fossa. Both of these were cultured. She had x-rays I think at  the [place], RMONI KEPLINGER. ([id number]) insistence of her insurance company on 09/23/16 x-ray of the pelvis was not particularly helpful she did have soft tissue air over the right lower pelvis although with the depth of this wound this is not surprising. An x-ray of her left knee did not show any specific abnormalities. We are still using silver alginate to these wound areas. Her MRI is booked for 10/27 10/26/16; cultures of the purulent drainage in her right initial tuberosity wound grew moderate Proteus and few staph aureus. The same organisms were cultured out of the left knee sinus tract posteriorly. The staph aureus is MRSA. I had started her on Augmentin last week I added doxycycline. The MRI of the left lower extremity and pelvis was finally done. The MRI of the femur showed subcutaneous soft tissue swelling edema fluid and myositis in the vastus lateralis muscle but no soft tissue abscess septic arthritis or osteomyelitis. MRI of the pelvis showed the left wound to be more expensive extending down to the bone there was osteomyelitis. Left hamstring tendons were also involved. No septic arthritis involving the hip. The decubitus ulcer on the right side showed no definite osteomyelitis or abscess.. The right hip wound is actually the one the probes 6 cm downward. But the MRI showing infection including osteomyelitis on the left explains the draining sinus in the popliteal fossa on the left. She did have antibiotics in the hospitalization last time and this extended into her nursing home stay but I'm not exactly sure what antibiotics and for what duration. According the patient this did include vancomycin with considerable effort of our staff we are able to get the patient into see Dr. Sampson Goon today. There were transportation difficulties. Her mother had open heart surgery and is in the ICU in [place] therefore her brother was unable to transport. Dr. Jarrett Ables office graciously arranged time  to see her today. From my point of view she is going to require IV vancomycin plus perhaps a third generation cephalosporin. I plan to keep her on doxycycline and Augmentin until the IV antibiotics can be arranged. 11/02/2016 - Krystiana presents today for management of ulcers; She saw Dr. Sampson Goon (infectious disease) last week who prescribed Zosyn and Vancomycin for MRI confirmation of osteomyelits to the left  ischiium. She is to have the PICC line placed today and receive the initial dose for both antibiotics today. She has yet to receive the offloading chair cushion and/or mattress overlay from home health, apparently this has been a 3 week process. I encouraged her to speak to home health regarding this matter, along with offering home health to contact the wouns care center with any questions or concerns. The left ischial pressure ulcer continues to imporve, while to right ischial ulcer has increased in depth. The popliteal fossa sinus tract remains unmeasurable due to the limitation of depth measurement (tract extends beyond our measuring devices). 11-16-2016 Ms. Pinnock presents today for evaluation and management of bilateral ischial stage IV pressure ulcers and sinus tract to the left popliteal fossa. she is under the care of Dr. Sampson Goon for IV antibiotic therapy; she states that the vancomycin was placed on hold and will be restarted at a lower dose based on her renal function. She continues taking Zosyn in addition to the vancomycin. She also states that she has yet to receive offloading cushions from home health, according to her she does not qualify for these offloading cushions because "the ulcers are unstageable ". We will contact the home health agency today to lend clarity regarding her pressure ulcers. The left popliteal fossa sinus tract continues to be a measurable as it extends beyond the length of our measuring devices.. 11/30/16; the patient is still on vancomycin and Zosyn.  The depth of the draining sinus behind her left popliteal fossa is down to 4 cm although there is still serosanguineous drainage coming out of this. She saw Dr. Sampson Goon of infectious disease yesterday the idea is to weeks more of IV antibiotics and then oral antibiotics although I have not read his note. The area on the left gluteal fold is just about healed. She has a 6 cm draining sinus over the right initial tuberosity although I cannot feel bone at the base of this. As far as the patient is aware she has not had a recheck of her inflammatory markers. 12/07/16; patient is on vancomycin and Zosyn appointment with Dr. Sampson Goon on the 19th at which point the patient expects to have a change in antibiotics. Remarkable improvement over the wound over the left ischial tuberosity which is just about closed. The draining sinus in her popliteal fossa has 0.4 cm in depth. The area on the right ischial tuberosity still probes down 7 cm. This is closed and overall wound dimensions Cuff, Kayna L. ([id number]) but not depth. 12/14/16; patient is completing her vancomycin and Zosyn and per her she is going to transition to Bactrim and Augmentin for another 3 weeks. The area on her left gluteal fold is closed except for some skin tears. The area behind her left knee is no longer has any depth. The only remaining area that is of clinical concern is on the right gluteal fold probing towards the right ischial tuberosity. Today this measures 6.9 cm in depth. Very gritty surface 12/21/16; patient is now on Bactrim and Augmentin as directed by infectious disease. This should be for another 2 weeks. The area in her left gluteal fold and left popliteal are closed over and fully healed. Measurements today at 7 cm in the right buttock wound is unchanged from last week. 12/27/16; patient is on Bactrim and Augmentin for another week as directed by infectious disease. She has completed her IV antibiotics. She is not  been systemically unwell no fever no chills. The area on  her right buttock measured over 6 cm in depth. There is no palpable bone. No evidence of surrounding soft tissue infection. She is complaining of tongue irritation and has a history of thrush 01/04/17; patient is been back to see Dr. Sampson Goon, her Augmentin was stopped but he continued the Bactrim for another 3 weeks. Depth of the wound is 6.7 cm there is been no major change in either direction. She is not receive the wound VAC from home health I think because of confusion about who is supposed to provided will actually talk to the home health agency today [[facility] The net no major change. 01/18/17; patient obtained her wound VAC about 10 days ago however for some reason it was not actually put on the wound. She is therefore here for [place] to apply this I guess. No other issues are noted. She is not complaining of pain fever drainage 02/01/17; patient is here now having the wound VAC or 3-4 weeks to a deep pressure area over the right initial tuberosity. This measures 6 cm in depth today which is about half a centimeter better than 2 weeks ago. There is no evidence she is systemically unwell no fever no chills no pain around the area. 02/15/17; I follow this patient every 2 weeks for a deep area over the right ischial tuberosity. This measures 5.5 cm today which is a continued improvement of 1.2 cm from 1/10 and down 0.5 cm from her visit 2 weeks ago 03/01/17; continued difficult area over the right initial tuberosity using the wound VAC. Depth today of 5.1 cm which is improved. Does not appear to be a lot of drainage in the canister. Antibiotics were finally stopped by Dr. Sampson Goon bactrim[]  and inflammatory markers have been repeated 03/15/17; fall this lady every 2 weeks for a difficult area over her lower right gluteal area/ischial tuberosity. Depth today at 4.6 cm. This is a slow but steady improvement in the depth of this wound. Although  we have labeled this as a pressure ulcer there may have been an underlying infection here at one point before we saw her. She had osteomyelitis on the left extensively which is since resolved 03/29/17- patient is here for follow-up evaluation of her right ischial pressure ulcer. She continues with the wound VAC and home health. According to the nurse home health has been using less foams and appropriate and we will instruct accordingly. The patient continues to smoke, approximately 10 cigarettes a day. She has been advised to decrease that in half by her next appointment, with a goal of complete cessation. She states her blood sugars have been consistently less than 150. She states that she spends most of her day position left lateral or prone. She does have an air mattress on her bed, she does not have an mattress for her chair, she states she cannot afford this. 04/12/17; patient is here for evaluation of her right ischial pressure ulcer. We continue to use a wound VAC with minimal improvement today the depth of this measuring 4.4 cm versus 4.6 cm 2 weeks ago. We are using a [employer] wound VAC on this area. There is not excessive drainage no pain. The patient tells me she tries to keep off this in bed but is up in the wheelchair for 2 hours a day. She is limited in no her overall ambulation but is improving and apparently is getting bilateral lower extremity braces which she hopes will improve her ability to walk independently. 05/10/17; Depth at 3.3cm. Improved 05/24/17;  depth at 3.5 cm. This is not improved since last time. Not clear if they are using collagen under the foam 06/07/17; depth that 2.9 which is a slight improvement. Still using the wound VAC. [name], [name] ([id number]) 06/21/17; depth is 2.9 cm which is exactly the same as last time. Also the appearance of this wound is completely the same. We have been using silver collagen under a wound VAC. Objective Constitutional Sitting or  standing Blood Pressure is within target range for patient.. Pulse regular and within target range for patient.Marland Kitchen Respirations regular, non-labored and within target range.. Temperature is normal and within the target range for the patient.Marland Kitchen appears in no distress. Vitals Time Taken: 8:08 [name], Height: 63 in, Weight: 257 lbs, BMI: 45.5, Temperature: 98.0 F, Pulse: 74 bpm, Respiratory Rate: 18 breaths/min, Blood Pressure: 114/47 mmHg. General Notes: Wound exam; the areas on the right buttock. Depth and condition of this wound at 2.9 cm completely the same as last time. Once again she has a very gritty nonviable surface over this wound. Using an open curet I aggressively debridement this area. There is no evidence of soft tissue erythema, crepitus or tenderness. She did not have a lot of drainage in the VAC canister Integumentary (Hair, Skin) Wound #3 status is Open. Original cause of wound was Pressure Injury. The wound is located on the Right Gluteal fold. The wound measures 1cm length x 1.4cm width x 2.9cm depth; 1.1cm^2 area and 3.189cm^3 volume. There is Fat Layer (Subcutaneous Tissue) Exposed exposed. There is no tunneling or undermining noted. There is a large amount of serous drainage noted. The wound margin is distinct with the outline attached to the wound base. There is medium (34-66%) red granulation within the wound bed. There is a medium (34-66%) amount of necrotic tissue within the wound bed including Adherent Slough. The periwound skin appearance exhibited: Scarring, Maceration. The periwound skin appearance did not exhibit: Callus, Crepitus, Excoriation, Induration, Rash, Dry/Scaly, Atrophie Blanche, Cyanosis, Ecchymosis, Hemosiderin Staining, Mottled, Pallor, Rubor, Erythema. Periwound temperature was noted as No Abnormality. The periwound has tenderness on palpation. Assessment Active Problems ICD-10 L89.314 - Pressure ulcer of right buttock, stage 4 E11.42 - Type 2 diabetes  mellitus with diabetic polyneuropathy [name], Amnah L. ([id number]) Procedures Wound #3 Pre-procedure diagnosis of Wound #3 is a Pressure Ulcer located on the Right Gluteal fold . There was a Skin/Subcutaneous Tissue Debridement ([id number]) debridement with total area of 1.4 sq cm performed by Maxwell Caul, MD. with the following instrument(s): scoop to remove Viable and Non-Viable tissue/material including Exudate, Fibrin/Slough, and Subcutaneous after achieving pain control using Lidocaine 4% Topical Solution. A time out was conducted at 08:27, prior to the start of the procedure. A Minimum amount of bleeding was controlled with Pressure. The procedure was tolerated well with a pain level of 0 throughout and a pain level of 0 following the procedure. Post Debridement Measurements: 1cm length x 1.4cm width x 2.9cm depth; 3.189cm^3 volume. Post debridement Stage noted as Category/Stage IV. Character of Wound/Ulcer Post Debridement requires further debridement. Post procedure Diagnosis Wound #3: Same as Pre-Procedure Plan Wound Cleansing: Wound #3 Right Gluteal fold: Clean wound with Normal Saline. Cleanse wound with mild soap and water Anesthetic: Wound #3 Right Gluteal fold: Topical Lidocaine 4% cream applied to wound bed prior to debridement - clinic use only Skin Barriers/Peri-Wound Care: Wound #3 Right Gluteal fold: Skin Prep Antifungal powder-Nystatin - Nystatin Powder to skin then gauze under drape of wound vac Primary Wound Dressing: Wound #3  Right Gluteal fold: Dry Gauze - over the nystatin powder Prisma Ag - (silver collagen) moisten with saline place in wound all the way to wound bed under wound vac then place the white foam Dressing Change Frequency: Wound #3 Right Gluteal fold: Three times weekly - HHRN to change MONDAY, and FRIDAY and pt to come to wound clinic on [facility], every other week. Follow-up Appointments: Wound #3 Right Gluteal fold: Collister, Carola L.  ([id number]) Return Appointment in 2 weeks. Off-Loading: Wound #3 Right Gluteal fold: Roho cushion for wheelchair - *********[facility] to order*********** Home Health: Wound #3 Right Gluteal fold: Continue Home Health Visits - [facility] *****Please order [facility] cushion****** Home Health Nurse may visit PRN to address patient s wound care needs. FACE TO FACE ENCOUNTER: MEDICARE and MEDICAID PATIENTS: I certify that this patient is under my care and that I had a face-to-face encounter that meets the physician face-to-face encounter requirements with this patient on this date. The encounter with the patient was in whole or in part for the following MEDICAL CONDITION: (primary reason for Home Healthcare) MEDICAL NECESSITY: I certify, that based on my findings, NURSING services are a medically necessary home health service. HOME BOUND STATUS: I certify that my clinical findings support that this patient is homebound (i.e., Due to illness or injury, pt requires aid of supportive devices such as crutches, cane, wheelchairs, walkers, the use of special transportation or the assistance of another person to leave their place of residence. There is a normal inability to leave the home and doing so requires considerable and taxing effort. Other absences are for medical reasons / religious services and are infrequent or of short duration when for other reasons). If current dressing causes regression in wound condition, may D/C ordered dressing product/s and apply Normal Saline Moist Dressing daily until next Wound Healing Center / Other MD appointment. Notify Wound Healing Center of regression in wound condition at [phone]. Please direct any NON-WOUND related issues/requests for orders to patient's Primary Care Physician Negative Pressure Wound Therapy: Wound #3 Right Gluteal fold: Wound VAC settings at 125/130 mmHg continuous pressure. Use BLACK/GREEN foam to wound cavity. Use  WHITE foam to fill any tunnel/s and/or undermining. Change VAC dressing 3 X WEEK. Change canister as indicated when full. Nurse may titrate settings and frequency of dressing changes as clinically indicated. - Prisma Ag (silver collagen) moisten with saline place in wound all the way to wound bed under wound vac then white foam Nystatin Powder to skin then gauze under drape of wound vac Home Health Nurse may d/c VAC for s/s of increased infection, significant wound regression, or uncontrolled drainage. Notify Wound Healing Center at [phone]. Number of foam/gauze pieces used in the dressing = Medications-please add to medication list.: Wound #3 Right Gluteal fold: Other: - Vitamin C, Zinc, Multivitamins Nystatin Powder to skin then gauze under drape of wound vac #1 I'm going to continue with the silver collagen under the wound VAC for another 2 weeks. If we are absolutely the same at next visit CONSIDER discontinuing the wound VAC in favor of more frequent packing. Also consider referral to plastic surgery Electronic Signature(s) Signed: 06/21/2017 5:06:34 PM By: Baltazar Najjar MD Entered By: Baltazar Najjar on 06/21/2017 09:14:10 Rayner, [name] ([id number]) Deese, [name] ([id number]) -------------------------------------------------------------------------------- SuperBill Details Patient Name: [name] Date of Service: 06/21/2017 Medical Record Patient Account Number: [id number] Number: Treating RN: Phillis Haggis Dec 02, 1961 (56 y.o. 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

## 2017-06-23 NOTE — Progress Notes (Signed)
JAYLN, BRANSCOM ([id number]) Visit Report for 06/21/2017 Arrival Information Details Patient Name: [name], [name] Date of Service: 06/21/2017 8:00 AM Medical Record Patient Account Number: [id number] Number: Treating RN: Ahmed Prima 20-Feb-1961 (56 y.o. Other Clinician: Date of Birth/Sex: Female) Treating ROBSON, MICHAEL Primary Care Sylvanus Telford: Lavera Guise Alizah Sills/Extender: G Referring Ayeden Gladman: Sallee Lange in Treatment: 8 Visit Information History Since Last Visit All ordered tests and consults were completed: No Patient Arrived: Wheel Chair Added or deleted any medications: No Arrival Time: 08:04 Any new allergies or adverse reactions: No Accompanied By: self Had a fall or experienced change in No Transfer Assistance: EasyP[employer] activities of daily living that may affect Patient Lift risk of falls: Patient Identification Verified: Yes Signs or symptoms of abuse/neglect since last No Secondary Verification Process Yes visito Completed: Hospitalized since last visit: No Patient Requires Transmission- No Has Dressing in Place as Prescribed: Yes Based Precautions: Pain Present Now: No Patient Has Alerts: Yes Patient Alerts: DM II Electronic Signature(s) Signed: 06/21/2017 4:41:02 PM By: Alric Quan Entered By: Alric Quan on 06/21/2017 08:07:56 Teachey, [name] ([id number]) -------------------------------------------------------------------------------- Encounter Discharge Information Details Patient Name: [name] Date of Service: 06/21/2017 8:00 AM Medical Record Patient Account Number: [id number] Number: Treating RN: Ahmed Prima 1961-12-06 (56 y.o. Other Clinician: Date of Birth/Sex: Female) Treating ROBSON, MICHAEL Primary Care Crockett Rallo: Lavera Guise Asra Gambrel/Extender: G Referring Vaughn Beaumier: Sallee Lange in Treatment: 61 Encounter Discharge Information Items Discharge Pain Level: 0 Discharge Condition:  Stable Ambulatory Status: Wheelchair Discharge Destination: Home Transportation: Other Accompanied By: self Schedule Follow-up Appointment: Yes Medication Reconciliation completed No and provided to Patient/Care Liany Mumpower: Patient Clinical Summary of Care: Declined Electronic Signature(s) Signed: 06/21/2017 8:55:50 AM By: Ruthine Dose Entered By: Ruthine Dose on 06/21/2017 08:55:50 Arvidson, [name] ([id number]) -------------------------------------------------------------------------------- Lower Extremity Assessment Details Patient Name: [name] Date of Service: 06/21/2017 8:00 AM Medical Record Patient Account Number: [id number] Number: Treating RN: Ahmed Prima September 20, 1961 (56 y.o. Other Clinician: Date of Birth/Sex: Female) Treating ROBSON, MICHAEL Primary Care Johnay Mano: Lavera Guise Maquita Sandoval/Extender: G Referring Benigno Check: Sallee Lange in Treatment: 39 Electronic Signature(s) Signed: 06/21/2017 4:41:02 PM By: Alric Quan Entered By: Alric Quan on 06/21/2017 08:16:49 [place]ar, [place] ([id number]) -------------------------------------------------------------------------------- Multi Wound Chart Details Patient Name: [name] Date of Service: 06/21/2017 8:00 AM Medical Record Patient Account Number: [id number] Number: Treating RN: Ahmed Prima 16-Aug-1961 (56 y.o. Other Clinician: Date of Birth/Sex: Female) Treating ROBSON, MICHAEL Primary Care Corban Kistler: Lavera Guise Kalea Perine/Extender: G Referring Fay Bagg: Sallee Lange in Treatment: 39 Vital Signs Height(in): 63 Pulse(bpm): 74 Weight(lbs): 257 Blood Pressure 114/47 (mmHg): Body Mass Index(BMI): 46 Temperature(F): 98.0 Respiratory Rate 18 (breaths/min): Photos: [3:No Photos] [N/A:N/A] Wound Location: [3:Right Gluteal fold] [N/A:N/A] Wounding Event: [3:Pressure Injury] [N/A:N/A] Primary Etiology: [3:Pressure Ulcer] [N/A:N/A] Comorbid History: [3:Asthma,  Hypertension, Type II Diabetes, Neuropathy] [N/A:N/A] Date Acquired: [3:04/21/2016] [N/A:N/A] Weeks of Treatment: [3:39] [N/A:N/A] Wound Status: [3:Open] [N/A:N/A] Measurements L x W x D 1x1.4x2.9 [N/A:N/A] (cm) Area (cm) : [3:1.1] [N/A:N/A] Volume (cm) : [3:3.189] [N/A:N/A] % Reduction in Area: [3:64.20%] [N/A:N/A] % Reduction in Volume: 81.50% [N/A:N/A] Classification: [3:Category/Stage IV] [N/A:N/A] Exudate Amount: [3:Large] [N/A:N/A] Exudate Type: [3:Serous] [N/A:N/A] Exudate Color: [3:amber] [N/A:N/A] Foul Odor After [3:Yes] [N/A:N/A] Cleansing: Odor Anticipated Due to No [N/A:N/A] Product Use: Wound Margin: [3:Distinct, outline attached] [N/A:N/A] Granulation Amount: [3:Medium (34-66%)] [N/A:N/A] Granulation Quality: [3:Red] [N/A:N/A] Necrotic Amount: [3:Medium (34-66%)] [N/A:N/A] Exposed Structures: Fat Layer (Subcutaneous N/A N/A Tissue) Exposed: Yes Epithelialization: None N/A N/A Periwound Skin Texture: Scarring:  Yes N/A N/A Excoriation: No Induration: No Callus: No Crepitus: No Rash: No Periwound Skin Maceration: Yes N/A N/A Moisture: Dry/Scaly: No Periwound Skin Color: Atrophie Blanche: No N/A N/A Cyanosis: No Ecchymosis: No Erythema: No Hemosiderin Staining: No Mottled: No Pallor: No Rubor: No Temperature: No Abnormality N/A N/A Tenderness on Yes N/A N/A Palpation: Wound Preparation: Ulcer Cleansing: N/A N/A Rinsed/Irrigated with Saline Topical Anesthetic Applied: Other: lidocaine 4% Treatment Notes Electronic Signature(s) Signed: 06/21/2017 5:06:34 PM By: Linton Ham MD Entered By: Linton Ham on 06/21/2017 08:39:17 Coin, [name] ([id number]) -------------------------------------------------------------------------------- Multi-Disciplinary Care Plan Details Patient Name: [name] Date of Service: 06/21/2017 8:00 AM Medical Record Patient Account Number: [id number] Number: Treating RN: Ahmed Prima [birth year] (56  y.o. Other Clinician: Date of Birth/Sex: Female) Treating ROBSON, MICHAEL Primary Care Harue Pribble: Lavera Guise Dearia Wilmouth/Extender: G Referring Yan Pankratz: Sallee Lange in Treatment: 44 Active Inactive ` Abuse / Safety / Falls / Self Care Management Nursing Diagnoses: Potential for falls Goals: Patient will remain injury free Date Initiated: 09/21/2016 Target Resolution Date: 03/25/2017 Goal Status: Active Interventions: Assess fall risk on admission and as needed Notes: ` Nutrition Nursing Diagnoses: Imbalanced nutrition Potential for alteratiion in Nutrition/Potential for imbalanced nutrition Goals: Patient/caregiver agrees to and verbalizes understanding of need to use nutritional supplements and/or vitamins as prescribed Date Initiated: 09/21/2016 Target Resolution Date: 03/25/2017 Goal Status: Active Patient/caregiver verbalizes understanding of need to maintain therapeutic glucose control per primary care physician Date Initiated: 09/21/2016 Target Resolution Date: 03/25/2017 Goal Status: Active Interventions: Assess patient nutrition upon admission and as needed per policy [place], [place]. ([id number]) Notes: ` Orientation to the Wound Care Program Nursing Diagnoses: Knowledge deficit related to the wound healing center program Goals: Patient/caregiver will verbalize understanding of the [place] Date Initiated: 09/21/2016 Target Resolution Date: 03/25/2017 Goal Status: Active Interventions: Provide education on orientation to the wound center Notes: ` Pain, Acute or Chronic Nursing Diagnoses: Pain, acute or chronic: actual or potential Potential alteration in comfort, pain Goals: Patient will verbalize adequate pain control and receive pain control interventions during procedures as needed Date Initiated: 09/21/2016 Target Resolution Date: 03/25/2017 Goal Status: Active Patient/caregiver will verbalize adequate pain control between  visits Date Initiated: 09/21/2016 Target Resolution Date: 03/25/2017 Goal Status: Active Patient/caregiver will verbalize comfort level met Date Initiated: 09/21/2016 Target Resolution Date: 03/25/2017 Goal Status: Active Interventions: Assess comfort goal upon admission Complete pain assessment as per visit requirements Notes: ` Wound/Skin Impairment Nursing Diagnoses: KIMBERLE, STANFILL ([id number]) Impaired tissue integrity Goals: Ulcer/skin breakdown will have a volume reduction of 30% by week 4 Date Initiated: 09/21/2016 Target Resolution Date: 03/25/2017 Goal Status: Active Ulcer/skin breakdown will have a volume reduction of 50% by week 8 Date Initiated: 09/21/2016 Target Resolution Date: 03/25/2017 Goal Status: Active Ulcer/skin breakdown will have a volume reduction of 80% by week 12 Date Initiated: 09/21/2016 Target Resolution Date: 03/25/2017 Goal Status: Active Interventions: Assess ulceration(s) every visit Notes: Electronic Signature(s) Signed: 06/21/2017 4:41:02 PM By: Alric Quan Entered By: Alric Quan on 06/21/2017 08:21:37 Eccleston, Janalyn Carlean Jews ([id number]) -------------------------------------------------------------------------------- Pain Assessment Details Patient Name: [name] Date of Service: 06/21/2017 8:00 AM Medical Record Patient Account Number: [id number] Number: Treating RN: Ahmed Prima December 16, 1961 (56 y.o. Other Clinician: Date of Birth/Sex: Female) Treating ROBSON, MICHAEL Primary Care Sheenah Dimitroff: Lavera Guise Sally-Ann Cutbirth/Extender: G Referring Siyah Mault: Sallee Lange in Treatment: 39 Active Problems Location of Pain Severity and Description of Pain Patient Has Paino No Site Locations With Dressing Change: No Pain Management and Medication  Current Pain Management: Electronic Signature(s) Signed: 06/21/2017 4:41:02 PM By: Alric Quan Entered By: Alric Quan on 06/21/2017 08:08:04 [place], [name]  ([id number]) -------------------------------------------------------------------------------- Patient/Caregiver Education Details Patient Name: [name] Date of Service: 06/21/2017 8:00 AM Medical Record Patient Account Number: [id number] Number: Treating RN: Ahmed Prima [birth year] (56 y.o. Other Clinician: Date of Birth/Gender: Female) Treating ROBSON, MICHAEL Primary Care Physician: Lavera Guise Physician/Extender: G Referring Physician: Sallee Lange in Treatment: 61 Education Assessment Education Provided To: Patient Education Topics Provided Wound/Skin Impairment: Handouts: Other: change dressing as ordered Methods: Demonstration, Explain/Verbal Responses: State content correctly Electronic Signature(s) Signed: 06/21/2017 4:41:02 PM By: Alric Quan Entered By: Alric Quan on 06/21/2017 08:21:25 Giaimo, Vila Carlean Jews ([id number]) -------------------------------------------------------------------------------- Wound Assessment Details Patient Name: [name] Date of Service: 06/21/2017 8:00 AM Medical Record Patient Account Number: [id number] Number: Treating RN: Ahmed Prima 05-30-1961 (56 y.o. Other Clinician: Date of Birth/Sex: Female) Treating ROBSON, MICHAEL Primary Care Rumaldo Difatta: Lavera Guise Rakayla Ricklefs/Extender: G Referring Bellamy Rubey: Sallee Lange in Treatment: 39 Wound Status Wound Number: 3 Primary Pressure Ulcer Etiology: Wound Location: Right Gluteal fold Wound Status: Open Wounding Event: Pressure Injury Comorbid Asthma, Hypertension, Type II Date Acquired: 04/21/2016 History: Diabetes, Neuropathy Weeks Of Treatment: 39 Clustered Wound: No Photos Photo Uploaded By: Alric Quan on 06/21/2017 10:32:11 Wound Measurements Length: (cm) 1 Width: (cm) 1.4 Depth: (cm) 2.9 Area: (cm) 1.1 Volume: (cm) 3.189 % Reduction in Area: 64.2% % Reduction in Volume: 81.5% Epithelialization: None Tunneling:  No Undermining: No Wound Description Classification: Category/Stage IV Foul Odor Aft Wound Margin: Distinct, outline attached Due to Produc Exudate Amount: Large Slough/Fibrin Exudate Type: Serous Exudate Color: amber er Cleansing: Yes t Use: No o Yes Wound Bed Granulation Amount: Medium (34-66%) Exposed Structure Granulation Quality: Red Fat Layer (Subcutaneous Tissue) Exposed: Yes Necrotic Amount: Medium (34-66%) Funches, Keyoni L. ([id number]) Necrotic Quality: Adherent Slough Periwound Skin Texture Texture Color No Abnormalities Noted: No No Abnormalities Noted: No Callus: No Atrophie Blanche: No Crepitus: No Cyanosis: No Excoriation: No Ecchymosis: No Induration: No Erythema: No Rash: No Hemosiderin Staining: No Scarring: Yes Mottled: No Pallor: No Moisture Rubor: No No Abnormalities Noted: No Dry / Scaly: No Temperature / Pain Maceration: Yes Temperature: No Abnormality Tenderness on Palpation: Yes Wound Preparation Ulcer Cleansing: Rinsed/Irrigated with Saline Topical Anesthetic Applied: Other: lidocaine 4%, Electronic Signature(s) Signed: 06/21/2017 4:41:02 PM By: Alric Quan Entered By: Alric Quan on 06/21/2017 08:16:39 Burek, [name] ([id number]) -------------------------------------------------------------------------------- Vitals Details Patient Name: [name] Date of Service: 06/21/2017 8:00 AM Medical Record Patient Account Number: [id number] Number: Treating RN: Ahmed Prima 1961/05/24 (56 y.o. Other Clinician: Date of Birth/Sex: Female) Treating ROBSON, MICHAEL Primary Care Kiyonna Tortorelli: Lavera Guise Nehemyah Foushee/Extender: G Referring Naaman Curro: Sallee Lange in Treatment: 39 Vital Signs Time Taken: 08:08 Temperature (F): 98.0 Height (in): 63 Pulse (bpm): 74 Weight (lbs): 257 Respiratory Rate (breaths/min): 18 Body Mass Index (BMI): 45.5 Blood Pressure (mmHg): 114/47 Reference Range: 80 - 120 mg /  dl Electronic Signature(s) Signed: 06/21/2017 4:41:02 PM By: Alric Quan Entered By: Alric Quan on 06/21/2017 10:07:12

## 2017-06-27 ENCOUNTER — Ambulatory Visit: Payer: Managed Care, Other (non HMO) | Admitting: Internal Medicine

## 2017-07-03 NOTE — Progress Notes (Signed)
Patient's Name: [name]  MRN: [id number]  Referring Provider: Lynnell Jude, MD  DOB: [birth year]  PCP: Lynnell Jude, MD  DOS: 07/04/2017  Note by: Gaspar Cola, MD  Service setting: Ambulatory outpatient  Specialty: Interventional Pain Management  Location: [facility] (AMB) Pain Management Facility    Patient type: Established   Primary Reason(s) for Visit: Encounter for evaluation before starting new chronic pain management plan of care (Level of risk: moderate) CC: Foot Pain (bilateral) and Joint Pain (left elbow, patient  believes this is arthritis. )  HPI  [name] is a 56 y.o. year old, female patient, who comes today for a follow-up evaluation to review the test results and decide on a treatment plan. 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[place]); Closed fracture of humerus, surgical neck; Kidney lump; Diabetes mellitus ([place]); Bilateral edema of lower extremity; Bilateral leg weakness; Closed 3-part fracture of surgical neck of right humerus with delayed healing; Infected decubitus ulcer, unstageable ([place]); Kidney mass; Other specified diabetes mellitus with ketoacidosis without coma ([place]); Chronic feet pain (Location of Primary Source of Pain) (Bilateral) (R>L); Diabetic peripheral neuropathy (HCC) (Location of Primary Source of Pain) (Bilateral) (R>L); Chronic lower extremity pain (Location of Secondary source of pain) (Bilateral) (R>L); Chronic pain syndrome; Long term (current) use of opiate analgesic; Long term prescription opiate use; Opiate use; Chronic hand pain (Location of Tertiary source of pain) (Bilateral) (L>R); Carpal tunnel syndrome (Bilateral) (L>R); History of stroke; CKD (chronic kidney disease) stage 3, GFR 30-59 ml/min; and Class 3 drug-induced obesity with serious comorbidity and body mass index (BMI) of 45.0 to 49.9 in adult [facility]) on her problem list. Her primarily concern today is the Foot Pain  (bilateral) and Joint Pain (left elbow, patient  believes this is arthritis. )  Pain Assessment: Location: Left, Right Foot Radiating: na, stays in the feet Onset: More than a month ago Duration: Chronic pain Quality: Burning, Constant, Tingling, Numbness, Pins and needles (descriptors vary during the day. ) Severity: 6 /10 (self-reported pain score)  Note: Reported level is inconsistent with clinical observations. Clinically the patient looks like a 3/10 Information on the proper use of the pain scale provided to the patient today Effect on ADL:   Timing: Constant Modifying factors: pain medicine, lidocaine cream, massage.  bouncing feet on the floor gives some relief but doesn't last, only diversion  [name] comes in today for a follow-up visit after her initial evaluation on 04/26/2017. Today we went over the results of her tests. These were explained in "Layman's terms". During today's appointment we went over my diagnostic impression, as well as the proposed treatment plan. The results of the bone scan would suggest that the patient does not have a chronic osteomyelitis. In view of this, the patient is an adequate candidate for interventional therapies.  In considering the treatment plan options, [name] was reminded that I no longer take patients for medication management only. I asked her to let me know if she had no intention of taking advantage of the interventional therapies, so that we could make arrangements to provide this space to someone interested. I also made it clear that undergoing interventional therapies for the purpose of getting pain medications is very inappropriate on the part of a patient, and it will not be tolerated in this practice. This type of behavior would suggest true addiction and therefore it requires referral to an addiction specialist.   Further  details on both, my assessment(s), as well as the proposed treatment plan, please see below.  Controlled Substance  Pharmacotherapy Assessment REMS (Risk Evaluation and Mitigation Strategy)  Analgesic: Oxycodone/APAP 10/325 one tablet every 8 hours (30 mg/day of oxycodone) (45 MME/Day) (last filled on 04/17/2017) Highest recorded MME/day: 61.36 mg/day MME/day: 45 mg/day Pill Count: None expected due to no prior prescriptions written by our practice. No notes on file Pharmacokinetics: Liberation and absorption (onset of action): WNL Distribution (time to peak effect): WNL Metabolism and excretion (duration of action): WNL         Pharmacodynamics: Desired effects: Analgesia: [name] reports >50% benefit. Functional ability: Patient reports that medication allows her to accomplish basic ADLs Clinically meaningful improvement in function (CMIF): Sustained CMIF goals met Perceived effectiveness: Described as relatively effective, allowing for increase in activities of daily living (ADL) Undesirable effects: Side-effects or Adverse reactions: None reported Monitoring: [place] PMP: Online review of the past 33-monthperiod previously conducted. Not applicable at this point since we have not taken over the patient's medication management yet. List of all Serum Drug Screening Test(s):  No results found for: AMPHSCRSER, BARBSCRSER, BENZOSCRSER, COCAINSCRSER, PCPSCRSER, T[place] PViolaList of all UDS test(s) done:  No results found for: TOXASSSELUR, SUMMARY Last UDS on record: No results found for: TOXASSSELUR, SUMMARY UDS interpretation: No unexpected findings.          Medication Assessment Form: Patient introduced to form today Treatment compliance: Treatment may start today if patient agrees with proposed plan. Evaluation of compliance is not applicable at this point Risk Assessment Profile: Aberrant behavior: See initial evaluations. None observed or detected today Comorbid factors increasing risk of overdose: See initial evaluation. No additional risks detected today Risk  Mitigation Strategies:  Patient opioid safety counseling: Completed today. Counseling provided to patient as per "Patient Counseling Document". Document signed by patient, attesting to counseling and understanding Patient-Prescriber Agreement (PPA): Obtained today.  Controlled substance notification to other providers: Written and sent today.  Pharmacologic Plan: Today we may be taking over the patient's pharmacological regimen. See below             Laboratory Chemistry  Inflammation Markers (CRP: Acute Phase) (ESR: Chronic Phase) Lab Results  Component Value Date   CRP <0.8 04/25/2017   ESRSEDRATE 63 (H) 04/25/2017                 Renal Function Markers Lab Results  Component Value Date   BUN 27 (H) 04/25/2017   CREATININE 1.39 (H) 04/25/2017   GFRAA 48 (L) 04/25/2017   GFRNONAA 42 (L) 04/25/2017  Interpretation of abnormal results: - BUN-to-creatinine ratio >20:1 (BUN dispropertionally higher than the creatinine levels) suggests prerenal azotemia (dehydration or renal hypoperfusion), while <10:1 levels suggest renal damage. eGFR (Estimated Glomerular Filtration Rate) results are reported as milliliters/minute/1.763m(mL/min/1.7390m Because some laboratories do not collect information on a patient's race when the sample is collected for testing, they may report calculated results for both African Americans and non-African Americans.  The [facility] only reporting actual results once values are < 60 mL/min. 1. Normal values: 90-120 mL/min 2. Below 60 mL/min suggests that some kidney damage has occurred. 3. Between 59 29d 30 indicate (Moderate) Stage 3 kidney disease. 4. Between 29 and 15 represent (Severe) Stage 4 kidney disease. 5. Less than 15 is considered (Kidney Failure) Stage 5.  Hepatic Function Markers Lab Results  Component Value Date   AST 17 04/25/2017   ALT 11 (L)  04/25/2017   ALBUMIN 3.5 04/25/2017   ALKPHOS 94 04/25/2017                  Electrolytes Lab Results  Component Value Date   NA 136 04/25/2017   K 4.3 04/25/2017   CL 100 (L) 04/25/2017   CALCIUM 9.5 04/25/2017   MG 1.7 04/25/2017                 Neuropathy Markers Lab Results  Component Value Date   VITAMINB12 402 04/25/2017                 Bone Pathology Markers Lab Results  Component Value Date   ALKPHOS 94 04/25/2017   VD25OH 19.7 (L) 04/25/2016   25OHVITD1 36 04/25/2017   25OHVITD2 <1.0 04/25/2017   25OHVITD3 36 04/25/2017   CALCIUM 9.5 04/25/2017                 Coagulation Parameters Lab Results  Component Value Date   PLT 260 04/25/2017                 Cardiovascular Markers Lab Results  Component Value Date   HGB 11.1 (L) 04/25/2017   HCT 35.2 04/25/2017                 Note: Lab results reviewed.  Recent Diagnostic Imaging Review  Nm Bone Scan Whole Body Result Date: 05/25/2017 CLINICAL DATA:  Chronic BILATERAL lower extremity pain, weakness and edema, chronic osteomyelitis, diabetes mellitus, hypertension ; by year ago head for infections and was septic, with lower buttock ulcer with drainage and a posterior LEFT knee ulcer which is now healed EXAM: NUCLEAR MEDICINE WHOLE BODY BONE SCAN TECHNIQUE: Whole body anterior and posterior images were obtained approximately 3 hours after intravenous injection of radiopharmaceutical. RADIOPHARMACEUTICALS:  21.855 mCi Technetium-34mMDP IV COMPARISON:  Limited bone scan of the feet 11/03/2008 Correlation: None recent; CT RIGHT humerus 05/24/2017, MRI LEFT femur 10/21/2016 FINDINGS: Uptake at the knees and feet greater on RIGHT, typically degenerative. Uptake at the [facility] bilaterally typically degenerative. More pronounced uptake of tracer is seen at the RIGHT humeral head and surgical neck, corresponding to site of fracture on prior CT. No additional sites of abnormal osseous tracer accumulation are identified. Specifically, no scintigraphic evidence of osteomyelitis involving the  pelvis or LEFT knee. Expected urinary tract and soft tissue distribution of tracer. IMPRESSION: Scattered degenerative type uptake with focal uptake at the proximal RIGHT humerus corresponding to fracture on prior CT. No scintigraphic evidence of osteomyelitis. Electronically Signed   By: MLavonia DanaM.D.   On: 05/25/2017 16:35   Cervical Imaging: Cervical MR w/wo contrast:  Results for orders placed during the hospital encounter of 09/09/16  MR Cervical Spine W Wo Contrast   Narrative CLINICAL DATA:  56year old female with bilateral lower extremity weakness since April. Was hospitalized for sepsis earlier this year. Tingling in the feet. Initial encounter.  EXAM: MRI CERVICAL SPINE WITHOUT AND WITH CONTRAST  TECHNIQUE: Multiplanar and multiecho pulse sequences of the cervical spine, to include the craniocervical junction and cervicothoracic junction, were obtained according to standard protocol without and with intravenous contrast.  CONTRAST:  20 mL MultiHance in conjunction with contrast enhanced imaging of the brain reported separately.  COMPARISON:  Brain MRI from today reported separately.  FINDINGS: Straightening of cervical lordosis. No marrow edema or evidence of acute osseous abnormality.  Cervicomedullary junction is within normal limits. Spinal cord signal is within normal limits at all  visualized levels. No abnormal intradural enhancement.  Negative visualized neck soft tissues. Partially retropharyngeal course of the right carotid. Major vascular flow voids in the neck are preserved.  No degenerative cervical spinal stenosis. Intermittent mild disc bulging in the cervical spine. No significant cervical neural foraminal stenosis results. No visualized upper thoracic spinal or foraminal stenosis.  IMPRESSION: Negative MRI appearance of the cervical spine. Normal cervical and visualized upper thoracic spinal cord.   Electronically Signed   By: Genevie Ann M.D.    On: 09/09/2016 12:48    Thoracic Imaging: Thoracic MR wo contrast:  Results for orders placed during the hospital encounter of 04/23/16  MR Thoracic Spine Wo Contrast   Narrative CLINICAL DATA:  Fall at home and unable to get up. Lower extremity numbness and weakness since the fall. The patient was found to be an diabetic ketoacidosis and septic.  EXAM: MRI THORACIC SPINE WITHOUT CONTRAST  TECHNIQUE: Multiplanar, multisequence MR imaging of the thoracic spine was performed. No intravenous contrast was administered.  COMPARISON:  MRI lumbar spine from the same day.  FINDINGS: The study is mildly degraded by patient motion. Normal signal is present in the thoracic spinal cord to the lowest imaged level, L1, within normal limits. Mild endplate marrow changes are noted anteriorly at T9-10 and T11-12. More subtle findings are present anteriorly at T6-7, T7-8, and T8-9. Vertebral body heights and alignment are maintained.  No central disc protrusion or foraminal stenosis is evident within the thoracic spine.  IMPRESSION: 1. Mild anterior endplate degenerative changes T6-7 through T11-12. 2. Otherwise normal marrow signal and vertebral body heights. 3. Left renal cyst partially imaged. No significant thoracic adenopathy.   Electronically Signed   By: San Morelle M.D.   On: 04/25/2016 15:11    Lumbosacral Imaging: Lumbar MR wo contrast:  Results for orders placed during the hospital encounter of 04/23/16  MR Lumbar Spine Wo Contrast   Narrative CLINICAL DATA:  Bilateral lower extremity numbness and weakness after fall at home. The patient was found to be an diabetic ketoacidosis and aseptic.  EXAM: MRI LUMBAR SPINE WITHOUT CONTRAST  TECHNIQUE: Multiplanar, multisequence MR imaging of the lumbar spine was performed. No intravenous contrast was administered.  COMPARISON:  MRI of the thoracic spine from the same day.  FINDINGS: Normal signal is present in  the conus medullaris which terminates at L1. Marrow signal, vertebral body heights, alignment are normal.  A 3 cm cystic lesion is present medially within the left kidney. Enlarged left para-aortic lymph node measures up to 15 mm in short axis at the level of the left renal hilum. Other smaller rounded left periaortic lymph nodes are present as well. The other nodes are less than 1 cm in short axis.  L1-2:  Negative.  L2-3:  Negative.  L3-4: Mild facet hypertrophy is present bilaterally without significant stenosis.  L4-5: Asymmetric right-sided ligamentum flavum thickening inch roots into the central canal posteriorly on the right. There is no significant stenosis. Slight disc bulging is noted.  L5-S1:  Negative.  A transitional S1 segment is present. A small disc is present at S1-2 without significant protrusion or stenosis.  IMPRESSION: 1. Enlarged left para-aortic lymph node at the renal hilum. Cystic lesions are noted in the kidneys. The kidneys are incompletely imaged. Recommend CT of the abdomen and pelvis with contrast for further evaluation to exclude malignancy. 2. Asymmetric right-sided facet hypertrophy and ligamentum flavum thickening without focal stenosis. 3. Transitional anatomy at S1.   Electronically Signed  By: San Morelle M.D.   On: 04/25/2016 15:07    Knee Imaging: Knee-L DG 4 views:  Results for orders placed during the hospital encounter of 10/03/16  DG Knee Complete 4 Views Left   Narrative CLINICAL DATA:  Pt here today because of a wound on the posterior left knee that has been there since August. Pt stated that it was actively draining today. Hx of diabetes, arthritis, hypertension.  EXAM: LEFT KNEE - COMPLETE 4+ VIEW  COMPARISON:  None.  FINDINGS: No fracture. No bone lesion. No areas of bone resorption are seen to suggest osteomyelitis.  Joints are normally spaced and aligned.  No arthropathic change.  No joint  effusion.  Dressings are seen along the posterior lateral aspect of the knee. No soft tissue air.  IMPRESSION: 1. No fracture, bone lesion or evidence of osteomyelitis. No knee joint abnormality.   Electronically Signed   By: Lajean Manes M.D.   On: 10/03/2016 11:26    Note: Results of ordered imaging test(s) reviewed and explained to patient in Layman's terms. Copy of results provided to patient  Meds   Current Meds  Medication Sig  . Ascorbic Acid (VITAMIN C) 1000 MG tablet Take 1,000 mg by mouth daily.  Marland Kitchen atorvastatin (LIPITOR) 20 MG tablet Take 20 mg by mouth at bedtime.  . Calcium Carbonate-Vitamin D3 (CALCIUM 600-D) 600-400 MG-UNIT TABS Take by mouth 2 (two) times daily.  . cholecalciferol (VITAMIN D) 1000 units tablet Take 5,000 Units by mouth daily.  Marland Kitchen doxycycline (VIBRAMYCIN) 100 MG capsule Take 100 mg by mouth 2 (two) times daily.  . DULoxetine (CYMBALTA) 20 MG capsule Take 60 mg by mouth daily.   . furosemide (LASIX) 20 MG tablet Take 1 tablet (20 mg total) by mouth daily.  Marland Kitchen gabapentin (NEURONTIN) 600 MG tablet Take 600 mg by mouth 3 (three) times daily. 2 tabs tid  . insulin aspart (NOVOLOG) 100 UNIT/ML injection Inject 6 Units into the skin 3 (three) times daily with meals. (Patient taking differently: 8 Units 3 (three) times daily with meals. )  . insulin detemir (LEVEMIR) 100 UNIT/ML injection Inject 60 Units into the skin every morning.  . latanoprost (XALATAN) 0.005 % ophthalmic solution Place 1 drop into both eyes at bedtime.  . metoprolol tartrate (LOPRESSOR) 25 MG tablet Take 1 tablet (25 mg total) by mouth 2 (two) times daily.  Marland Kitchen oxybutynin (DITROPAN-XL) 10 MG 24 hr tablet Take 10 mg by mouth at bedtime.  Marland Kitchen oxyCODONE-acetaminophen (PERCOCET) 10-325 MG tablet Take 1 tablet by mouth every 6 (six) hours as needed for pain.  . pantoprazole (PROTONIX) 20 MG tablet Take 20 mg by mouth daily.  . traZODone (DESYREL) 50 MG tablet Take 1 tablet (50 mg total) by mouth  at bedtime. (Patient taking differently: Take 100 mg by mouth at bedtime. )    ROS  Constitutional: Denies any fever or chills Gastrointestinal: No reported hemesis, hematochezia, vomiting, or acute GI distress Musculoskeletal: Denies any acute onset joint swelling, redness, loss of ROM, or weakness Neurological: No reported episodes of acute onset apraxia, aphasia, dysarthria, agnosia, amnesia, paralysis, loss of coordination, or loss of consciousness  Allergies  Ms. Haverland is allergic to biaxin [clarithromycin].  PFSH  Drug: Ms. Dormer  has no drug history on file. Alcohol:  reports that she does not drink alcohol. Tobacco:  reports that she has been smoking.  She has been smoking about 0.25 packs per day. She has never used smokeless tobacco. Medical:  has a past  medical history of Acute pain of right shoulder (05/19/2016); Acute PN (pyelonephritis) (05/18/2016); Acute pyelonephritis (05/18/2016); Anxiety; Arthritis; Asthma; Diabetes mellitus without complication ([place]); GERD (gastroesophageal reflux disease); Glaucoma; Hyperlipemia; Hyperlipemia; and Hypertension. Surgical: Ms. Keeven  has a past surgical history that includes Anterior cruciate ligament repair. Family: family history includes Diabetes in her mother; Heart disease in her mother; Stroke in her mother.  Constitutional Exam  General appearance: Well nourished, well developed, and well hydrated. In no apparent acute distress Vitals:   07/04/17 0933  BP: 103/60  Pulse: 75  Resp: 16  Temp: 98.4 F (36.9 C)  TempSrc: Oral  SpO2: 97%  Weight: 273 lb (123.8 kg)  Height: 5' 3"  (1.6 m)   BMI Assessment: Estimated body mass index is 48.36 kg/m as calculated from the following:   Height as of this encounter: 5' 3"  (1.6 m).   Weight as of this encounter: 273 lb (123.8 kg).  BMI interpretation table: BMI level Category Range association with higher incidence of chronic pain  <18 kg/m2 Underweight   18.5-24.9 kg/m2 Ideal body  weight   25-29.9 kg/m2 Overweight Increased incidence by 20%  30-34.9 kg/m2 Obese (Class I) Increased incidence by 68%  35-39.9 kg/m2 Severe obesity (Class II) Increased incidence by 136%  >40 kg/m2 Extreme obesity (Class III) Increased incidence by 254%   BMI Readings from Last 4 Encounters:  07/04/17 48.36 kg/m  04/25/17 48.71 kg/m  06/27/16 46.77 kg/m  04/30/16 49.26 kg/m   Wt Readings from Last 4 Encounters:  07/04/17 273 lb (123.8 kg)  04/25/17 275 lb (124.7 kg)  06/27/16 264 lb (119.7 kg)  04/30/16 296 lb (134.3 kg)  Psych/Mental status: Alert, oriented x 3 (person, place, & time)       Eyes: PERLA Respiratory: No evidence of acute respiratory distress  Cervical Spine Exam  Inspection: No masses, redness, or swelling Alignment: Symmetrical Functional ROM: Unrestricted ROM      Stability: No instability detected Muscle strength & Tone: Functionally intact Sensory: Unimpaired Palpation: No palpable anomalies              Upper Extremity (UE) Exam    Side: Right upper extremity  Side: Left upper extremity  Inspection: No masses, redness, swelling, or asymmetry. No contractures  Inspection: No masses, redness, swelling, or asymmetry. No contractures  Functional ROM: Unrestricted ROM          Functional ROM: Unrestricted ROM          Muscle strength & Tone: Functionally intact  Muscle strength & Tone: Functionally intact  Sensory: Unimpaired  Sensory: Unimpaired  Palpation: No palpable anomalies              Palpation: No palpable anomalies              Specialized Test(s): Deferred         Specialized Test(s): Deferred          Thoracic Spine Exam  Inspection: No masses, redness, or swelling Alignment: Symmetrical Functional ROM: Unrestricted ROM Stability: No instability detected Sensory: Unimpaired Muscle strength & Tone: No palpable anomalies  Lumbar Spine Exam  Inspection: No masses, redness, or swelling Alignment: Symmetrical Functional ROM: Unrestricted  ROM      Stability: No instability detected Muscle strength & Tone: Functionally intact Sensory: Unimpaired Palpation: No palpable anomalies       Provocative Tests: Lumbar Hyperextension and rotation test: evaluation deferred today       Patrick's Maneuver: evaluation deferred today  Gait & Posture Assessment  Ambulation: Patient came in today in a wheel chair Gait: Very limited, using assistive device to ambulate Posture: Antalgic   Lower Extremity Exam    Side: Right lower extremity  Side: Left lower extremity  Inspection: No masses, redness, swelling, or asymmetry. No contractures  Inspection: No masses, redness, swelling, or asymmetry. No contractures  Functional ROM: Unrestricted ROM          Functional ROM: Unrestricted ROM          Muscle strength & Tone: Moderate-to-severe deconditioning  Muscle strength & Tone: Moderate-to-severe deconditioning  Sensory: Unimpaired  Sensory: Unimpaired  Palpation: No palpable anomalies  Palpation: No palpable anomalies   Assessment & Plan  Primary Diagnosis & Pertinent Problem List: The primary encounter diagnosis was Chronic feet pain (Location of Primary Source of Pain) (Bilateral) (R>L). Diagnoses of Chronic lower extremity pain (Location of Secondary source of pain) (Bilateral) (R>L), Chronic hand pain, unspecified laterality, Diabetic peripheral neuropathy (HCC) (Location of Primary Source of Pain) (Bilateral) (R>L), Diabetic ketoacidosis without coma associated with type 2 diabetes mellitus ([place]), Other specified diabetes mellitus with ketoacidosis without coma ([place]), Kidney lump, Kidney mass, CKD (chronic kidney disease) stage 3, GFR 30-59 ml/min, and Class 3 drug-induced obesity with serious comorbidity and body mass index (BMI) of 45.0 to 49.9 in adult [facility]) were also pertinent to this visit.  Visit Diagnosis: 1. Chronic feet pain (Location of Primary Source of Pain) (Bilateral) (R>L)   2. Chronic lower extremity pain  (Location of Secondary source of pain) (Bilateral) (R>L)   3. Chronic hand pain, unspecified laterality   4. Diabetic peripheral neuropathy (HCC) (Location of Primary Source of Pain) (Bilateral) (R>L)   5. Diabetic ketoacidosis without coma associated with type 2 diabetes mellitus ([place])   6. Other specified diabetes mellitus with ketoacidosis without coma ([place])   7. Kidney lump   8. Kidney mass   9. CKD (chronic kidney disease) stage 3, GFR 30-59 ml/min   10. Class 3 drug-induced obesity with serious comorbidity and body mass index (BMI) of 45.0 to 49.9 in adult [facility])    Problems updated and reviewed during this visit: No problems updated.  Plan of Care  Pharmacotherapy (Medications Ordered): No orders of the defined types were placed in this encounter.  Lab-work, procedure(s), and/or referral(s): Orders Placed This Encounter  Procedures  . Radiofrequency, sympathectomy  . Ambulatory referral to Nephrology  . Ambulatory referral to Endocrinology  . Amb ref to Medical Nutrition Therapy-MNT    Pharmacological management options:  Opioid Analgesics: We'll take over management today. See above orders Membrane stabilizer: We have discussed the possibility of optimizing this mode of therapy, if tolerated Muscle relaxant: We have discussed the possibility of a trial NSAID: We have discussed the possibility of a trial Other analgesic(s): To be determined at a later time   Interventional management options: Planned, scheduled, and/or pending:    Today we will be getting referrals for endocrinology to manage her diabetes, nephrology to manage her kidney dysfunction, and a nutritionist to assist her in losing weight. Diagnostic bilateral lumbar sympathetic blocks    Considering:   Diagnostic bilateral lumbar sympathetic blocks  Possible bilateral lumbar sympathetic RFA  Diagnostic Lidocaine infusion    PRN Procedures:   To be determined at a later time   Provider-requested  follow-up: Return for procedure (w/ sedation), by MD.  Future Appointments Date Time Provider [place]  07/19/2017 8:00 AM Ricard Dillon, MD [facility] None  07/25/2017 10:00 AM Dossie Arbour,  Beatriz Chancellor, MD [facility] None  08/02/2017 8:00 AM Ricard Dillon, MD [facility] None  08/16/2017 8:00 AM Ricard Dillon, MD [facility] None    Primary Care Physician: Lynnell Jude, MD Location: [facility] Note by: Gaspar Cola, MD Date: 07/04/2017; Time: 10:44 AM  Patient Instructions   ____________________________________________________________________________________________  Preparing for Procedure with Sedation Instructions: . Oral Intake: Do not eat or drink anything for at least 8 hours prior to your procedure. . Transportation: Public transportation is not allowed. Bring an adult driver. The driver must be physically present in our waiting room before any procedure can be started. Marland Kitchen Physical Assistance: Bring an adult physically capable of assisting you, in the event you need help. This adult should keep you company at home for at least 6 hours after the procedure. . Blood Pressure Medicine: Take your blood pressure medicine with a sip of water the morning of the procedure. . Blood thinners:  . Diabetics on insulin: Notify the staff so that you can be scheduled 1st case in the morning. If your diabetes requires high dose insulin, take only  of your normal insulin dose the morning of the procedure and notify the staff that you have done so. . Preventing infections: Shower with an antibacterial soap the morning of your procedure. . Build-up your immune system: Take 1000 mg of Vitamin C with every meal (3 times a day) the day prior to your procedure. Marland Kitchen Antibiotics: Inform the staff if you have a condition or reason that requires you to take antibiotics before dental procedures. . Pregnancy: If you are pregnant, call and cancel the  procedure. . Sickness: If you have a cold, fever, or any active infections, call and cancel the procedure. . Arrival: You must be in the facility at least 30 minutes prior to your scheduled procedure. . Children: Do not bring children with you. . Dress appropriately: Bring dark clothing that you would not mind if they get stained. . Valuables: Do not bring any jewelry or valuables. Procedure appointments are reserved for interventional treatments only. Marland Kitchen No Prescription Refills. . No medication changes will be discussed during procedure appointments. . No disability issues will be discussed. ____________________________________________________________________________________________   Sympathetic Nerve Block, Care After These instructions provide you with information about caring for yourself after your procedure. Your health care provider may also give you more specific instructions. Your treatment has been planned according to current medical practices, but problems sometimes occur. Call your health care provider if you have any problems or questions after your procedure. What can I expect after the procedure? After your procedure, it is common for the area where the medicine was injected to be:  Sore.  Warm.  Weak.  Numb.  If the injection was made in your neck, you may also have:  Voice changes.  A droopy eyelid.  Trouble swallowing.  A stuffy nose.  Follow these instructions at home:  For the first 24 hours after your procedure: ? Do not drive. ? Rest. ? Avoid activities that require a lot of energy.  Keep track of the amount of pain relief that you feel and how long it lasts.  Do not apply heat near or over the injection sites.  Do not take a bath or soak in water, such as in a pool or lake, until your health care provider approves.  Take over-the-counter and prescription medicines only as told by your health care provider.  If you have trouble swallowing,  take small  bites when eating and small sips of water when drinking until you are able to swallow normally.  Keep all follow-up visits as told by your health care provider. This is important. Contact a health care provider if:  You have numbness that lasts longer than 8 hours.  You continue to have pain for more than 24 hours after your procedure.  You have worsening pain or swelling around an injection site.  There are red streaks around an injection site. Get help right away if:  You cannot swallow.  You have chest pain.  You have trouble breathing. This information is not intended to replace advice given to you by your health care provider. Make sure you discuss any questions you have with your health care provider. Document Released: 04/28/2014 Document Revised: 05/20/2016 Document Reviewed: 04/07/2016 Elsevier Interactive Patient Education  2018 [place]  What are the risk, side effects and possible complications? Generally speaking, most procedures are safe.  However, with any procedure there are risks, side effects, and the possibility of complications.  The risks and complications are dependent upon the sites that are lesioned, or the type of nerve block to be performed.  The closer the procedure is to the spine, the more serious the risks are.  Great care is taken when placing the radio frequency needles, block needles or lesioning probes, but sometimes complications can occur. 1. Infection: Any time there is an injection through the skin, there is a risk of infection.  This is why sterile conditions are used for these blocks.  There are four possible types of infection. 1. Localized skin infection. 2. Central Nervous System Infection-This can be in the form of Meningitis, which can be deadly. 3. Epidural Infections-This can be in the form of an epidural abscess, which can cause pressure inside of the spine, causing compression of the spinal  cord with subsequent paralysis. This would require an emergency surgery to decompress, and there are no guarantees that the patient would recover from the paralysis. 4. Discitis-This is an infection of the intervertebral discs.  It occurs in about 1% of discography procedures.  It is difficult to treat and it may lead to surgery.        2. Pain: the needles have to go through skin and soft tissues, will cause soreness.       3. Damage to internal structures:  The nerves to be lesioned may be near blood vessels or    other nerves which can be potentially damaged.       4. Bleeding: Bleeding is more common if the patient is taking blood thinners such as  aspirin, Coumadin, Ticiid, Plavix, etc., or if he/she have some genetic predisposition  such as hemophilia. Bleeding into the spinal canal can cause compression of the spinal  cord with subsequent paralysis.  This would require an emergency surgery to  decompress and there are no guarantees that the patient would recover from the  paralysis.       5. Pneumothorax:  Puncturing of a lung is a possibility, every time a needle is introduced in  the area of the chest or upper back.  Pneumothorax refers to free air around the  collapsed lung(s), inside of the thoracic cavity (chest cavity).  Another two possible  complications related to a similar event would include: Hemothorax and Chylothorax.   These are variations of the Pneumothorax, where instead of air around the collapsed  lung(s), you may have blood or chyle, respectively.  6. Spinal headaches: They may occur with any procedures in the area of the spine.       7. Persistent CSF (Cerebro-Spinal Fluid) leakage: This is a rare problem, but may occur  with prolonged intrathecal or epidural catheters either due to the formation of a fistulous  track or a dural tear.       8. Nerve damage: By working so close to the spinal cord, there is always a possibility of  nerve damage, which could be as serious as a  permanent spinal cord injury with  paralysis.       9. Death:  Although rare, severe deadly allergic reactions known as "Anaphylactic  reaction" can occur to any of the medications used.      10. Worsening of the symptoms:  We can always make thing worse.  What are the chances of something like this happening? Chances of any of this occuring are extremely low.  By statistics, you have more of a chance of getting killed in a motor vehicle accident: while driving to the hospital than any of the above occurring .  Nevertheless, you should be aware that they are possibilities.  In general, it is similar to taking a shower.  Everybody knows that you can slip, hit your head and get killed.  Does that mean that you should not shower again?  Nevertheless always keep in mind that statistics do not mean anything if you happen to be on the wrong side of them.  Even if a procedure has a 1 (one) in a 1,000,000 (million) chance of going wrong, it you happen to be that one..Also, keep in mind that by statistics, you have more of a chance of having something go wrong when taking medications.  Who should not have this procedure? If you are on a blood thinning medication (e.g. Coumadin, Plavix, see list of "Blood Thinners"), or if you have an active infection going on, you should not have the procedure.  If you are taking any blood thinners, please inform your physician.  How should I prepare for this procedure?  Do not eat or drink anything at least six hours prior to the procedure.  Bring a driver with you .  It cannot be a taxi.  Come accompanied by an adult that can drive you back, and that is strong enough to help you if your legs get weak or numb from the local anesthetic.  Take all of your medicines the morning of the procedure with just enough water to swallow them.  If you have diabetes, make sure that you are scheduled to have your procedure done first thing in the morning, whenever possible.  If you  have diabetes, take only half of your insulin dose and notify our nurse that you have done so as soon as you arrive at the clinic.  If you are diabetic, but only take blood sugar pills (oral hypoglycemic), then do not take them on the morning of your procedure.  You may take them after you have had the procedure.  Do not take aspirin or any aspirin-containing medications, at least eleven (11) days prior to the procedure.  They may prolong bleeding.  Wear loose fitting clothing that may be easy to take off and that you would not mind if it got stained with Betadine or blood.  Do not wear any jewelry or perfume  Remove any nail coloring.  It will interfere with some of our monitoring equipment.  NOTE: Remember that this is  not meant to be interpreted as a complete list of all possible complications.  Unforeseen problems may occur.  BLOOD THINNERS The following drugs contain aspirin or other products, which can cause increased bleeding during surgery and should not be taken for 2 weeks prior to and 1 week after surgery.  If you should need take something for relief of minor pain, you may take acetaminophen which is found in Tylenol,m Datril, Anacin-3 and Panadol. It is not blood thinner. The products listed below are.  Do not take any of the products listed below in addition to any listed on your instruction sheet.  A.P.C or A.P.C with Codeine Codeine Phosphate Capsules #3 Ibuprofen Ridaura  ABC compound Congesprin Imuran rimadil  Advil Cope Indocin Robaxisal  Alka-Seltzer Effervescent Pain Reliever and Antacid Coricidin or Coricidin-D  Indomethacin Rufen  Alka-Seltzer plus Cold Medicine Cosprin Ketoprofen S-A-C Tablets  Anacin Analgesic Tablets or Capsules Coumadin Korlgesic Salflex  Anacin Extra Strength Analgesic tablets or capsules CP-2 Tablets Lanoril Salicylate  Anaprox Cuprimine Capsules Levenox Salocol  Anexsia-D Dalteparin Magan Salsalate  Anodynos Darvon compound Magnesium  Salicylate Sine-off  Ansaid Dasin Capsules Magsal Sodium Salicylate  Anturane Depen Capsules Marnal Soma  APF Arthritis pain formula Dewitt's Pills Measurin Stanback  Argesic Dia-Gesic Meclofenamic Sulfinpyrazone  Arthritis Bayer Timed Release Aspirin Diclofenac Meclomen Sulindac  Arthritis pain formula Anacin Dicumarol Medipren Supac  Analgesic (Safety coated) Arthralgen Diffunasal Mefanamic Suprofen  Arthritis Strength Bufferin Dihydrocodeine Mepro Compound Suprol  Arthropan liquid Dopirydamole Methcarbomol with Aspirin Synalgos  ASA tablets/Enseals Disalcid Micrainin Tagament  Ascriptin Doan's Midol Talwin  Ascriptin A/D Dolene Mobidin Tanderil  Ascriptin Extra Strength Dolobid Moblgesic Ticlid  Ascriptin with Codeine Doloprin or Doloprin with Codeine Momentum Tolectin  Asperbuf Duoprin Mono-gesic Trendar  Aspergum Duradyne Motrin or Motrin IB Triminicin  Aspirin plain, buffered or enteric coated Durasal Myochrisine Trigesic  Aspirin Suppositories Easprin Nalfon Trillsate  Aspirin with Codeine Ecotrin Regular or Extra Strength Naprosyn Uracel  Atromid-S Efficin Naproxen Ursinus  Auranofin Capsules Elmiron Neocylate Vanquish  Axotal Emagrin Norgesic Verin  Azathioprine Empirin or Empirin with Codeine Normiflo Vitamin E  Azolid Emprazil Nuprin Voltaren  Bayer Aspirin plain, buffered or children's or timed BC Tablets or powders Encaprin Orgaran Warfarin Sodium  Buff-a-Comp Enoxaparin Orudis Zorpin  Buff-a-Comp with Codeine Equegesic Os-Cal-Gesic   Buffaprin Excedrin plain, buffered or Extra Strength Oxalid   Bufferin Arthritis Strength Feldene Oxphenbutazone   Bufferin plain or Extra Strength Feldene Capsules Oxycodone with Aspirin   Bufferin with Codeine Fenoprofen Fenoprofen Pabalate or Pabalate-SF   Buffets II Flogesic Panagesic   Buffinol plain or Extra Strength Florinal or Florinal with Codeine Panwarfarin   Buf-Tabs Flurbiprofen Penicillamine   Butalbital Compound Four-way  cold tablets Penicillin   Butazolidin Fragmin Pepto-Bismol   Carbenicillin Geminisyn Percodan   Carna Arthritis Reliever Geopen Persantine   Carprofen Gold's salt Persistin   Chloramphenicol Goody's Phenylbutazone   Chloromycetin Haltrain Piroxlcam   Clmetidine heparin Plaquenil   Cllnoril Hyco-pap Ponstel   Clofibrate Hydroxy chloroquine Propoxyphen         Before stopping any of these medications, be sure to consult the physician who ordered them.  Some, such as Coumadin (Warfarin) are ordered to prevent or treat serious conditions such as "deep thrombosis", "pumonary embolisms", and other heart problems.  The amount of time that you may need off of the medication may also vary with the medication and the reason for which you were taking it.  If you are taking any of these medications, please make sure  you notify your pain physician before you undergo any procedures.

## 2017-07-04 ENCOUNTER — Encounter: Payer: Self-pay | Admitting: Pain Medicine

## 2017-07-04 ENCOUNTER — Ambulatory Visit: Payer: BLUE CROSS/BLUE SHIELD | Attending: Pain Medicine | Admitting: Pain Medicine

## 2017-07-04 VITALS — BP 103/60 | HR 75 | Temp 98.4°F | Resp 16 | Ht 63.0 in | Wt 273.0 lb

## 2017-07-04 DIAGNOSIS — F419 Anxiety disorder, unspecified: Secondary | ICD-10-CM | POA: Insufficient documentation

## 2017-07-04 DIAGNOSIS — M79672 Pain in left foot: Secondary | ICD-10-CM | POA: Diagnosis present

## 2017-07-04 DIAGNOSIS — M79643 Pain in unspecified hand: Secondary | ICD-10-CM | POA: Diagnosis not present

## 2017-07-04 DIAGNOSIS — N281 Cyst of kidney, acquired: Secondary | ICD-10-CM | POA: Insufficient documentation

## 2017-07-04 DIAGNOSIS — M79641 Pain in right hand: Secondary | ICD-10-CM | POA: Diagnosis not present

## 2017-07-04 DIAGNOSIS — K219 Gastro-esophageal reflux disease without esophagitis: Secondary | ICD-10-CM | POA: Insufficient documentation

## 2017-07-04 DIAGNOSIS — F17210 Nicotine dependence, cigarettes, uncomplicated: Secondary | ICD-10-CM | POA: Insufficient documentation

## 2017-07-04 DIAGNOSIS — E785 Hyperlipidemia, unspecified: Secondary | ICD-10-CM | POA: Diagnosis not present

## 2017-07-04 DIAGNOSIS — Z6841 Body Mass Index (BMI) 40.0 and over, adult: Secondary | ICD-10-CM | POA: Insufficient documentation

## 2017-07-04 DIAGNOSIS — M79642 Pain in left hand: Secondary | ICD-10-CM | POA: Insufficient documentation

## 2017-07-04 DIAGNOSIS — N183 Chronic kidney disease, stage 3 unspecified: Secondary | ICD-10-CM

## 2017-07-04 DIAGNOSIS — E1310 Other specified diabetes mellitus with ketoacidosis without coma: Secondary | ICD-10-CM | POA: Diagnosis not present

## 2017-07-04 DIAGNOSIS — E1142 Type 2 diabetes mellitus with diabetic polyneuropathy: Secondary | ICD-10-CM | POA: Insufficient documentation

## 2017-07-04 DIAGNOSIS — M79671 Pain in right foot: Secondary | ICD-10-CM | POA: Diagnosis present

## 2017-07-04 DIAGNOSIS — G8220 Paraplegia, unspecified: Secondary | ICD-10-CM | POA: Insufficient documentation

## 2017-07-04 DIAGNOSIS — M79604 Pain in right leg: Secondary | ICD-10-CM | POA: Diagnosis not present

## 2017-07-04 DIAGNOSIS — G5603 Carpal tunnel syndrome, bilateral upper limbs: Secondary | ICD-10-CM | POA: Insufficient documentation

## 2017-07-04 DIAGNOSIS — G8929 Other chronic pain: Secondary | ICD-10-CM | POA: Diagnosis not present

## 2017-07-04 DIAGNOSIS — E1110 Type 2 diabetes mellitus with ketoacidosis without coma: Secondary | ICD-10-CM | POA: Diagnosis not present

## 2017-07-04 DIAGNOSIS — M79674 Pain in right toe(s): Secondary | ICD-10-CM

## 2017-07-04 DIAGNOSIS — E661 Drug-induced obesity: Secondary | ICD-10-CM

## 2017-07-04 DIAGNOSIS — L97829 Non-pressure chronic ulcer of other part of left lower leg with unspecified severity: Secondary | ICD-10-CM | POA: Insufficient documentation

## 2017-07-04 DIAGNOSIS — Z794 Long term (current) use of insulin: Secondary | ICD-10-CM | POA: Diagnosis not present

## 2017-07-04 DIAGNOSIS — J45909 Unspecified asthma, uncomplicated: Secondary | ICD-10-CM | POA: Insufficient documentation

## 2017-07-04 DIAGNOSIS — E1022 Type 1 diabetes mellitus with diabetic chronic kidney disease: Secondary | ICD-10-CM | POA: Insufficient documentation

## 2017-07-04 DIAGNOSIS — Z8673 Personal history of transient ischemic attack (TIA), and cerebral infarction without residual deficits: Secondary | ICD-10-CM | POA: Diagnosis not present

## 2017-07-04 DIAGNOSIS — M79675 Pain in left toe(s): Secondary | ICD-10-CM

## 2017-07-04 DIAGNOSIS — Z79891 Long term (current) use of opiate analgesic: Secondary | ICD-10-CM | POA: Diagnosis not present

## 2017-07-04 DIAGNOSIS — M79605 Pain in left leg: Secondary | ICD-10-CM | POA: Diagnosis not present

## 2017-07-04 DIAGNOSIS — I129 Hypertensive chronic kidney disease with stage 1 through stage 4 chronic kidney disease, or unspecified chronic kidney disease: Secondary | ICD-10-CM | POA: Diagnosis not present

## 2017-07-04 DIAGNOSIS — G894 Chronic pain syndrome: Secondary | ICD-10-CM | POA: Insufficient documentation

## 2017-07-04 DIAGNOSIS — N2889 Other specified disorders of kidney and ureter: Secondary | ICD-10-CM

## 2017-07-04 NOTE — Patient Instructions (Addendum)
____________________________________________________________________________________________  Preparing for Procedure with Sedation Instructions: . Oral Intake: Do not eat or drink anything for at least 8 hours prior to your procedure. . Transportation: Public transportation is not allowed. Bring an adult driver. The driver must be physically present in our waiting room before any procedure can be started. Marland Kitchen Physical Assistance: Bring an adult physically capable of assisting you, in the event you need help. This adult should keep you company at home for at least 6 hours after the procedure. . Blood Pressure Medicine: Take your blood pressure medicine with a sip of water the morning of the procedure. . Blood thinners:  . Diabetics on insulin: Notify the staff so that you can be scheduled 1st case in the morning. If your diabetes requires high dose insulin, take only  of your normal insulin dose the morning of the procedure and notify the staff that you have done so. . Preventing infections: Shower with an antibacterial soap the morning of your procedure. . Build-up your immune system: Take 1000 mg of Vitamin C with every meal (3 times a day) the day prior to your procedure. Marland Kitchen Antibiotics: Inform the staff if you have a condition or reason that requires you to take antibiotics before dental procedures. . Pregnancy: If you are pregnant, call and cancel the procedure. . Sickness: If you have a cold, fever, or any active infections, call and cancel the procedure. . Arrival: You must be in the facility at least 30 minutes prior to your scheduled procedure. . Children: Do not bring children with you. . Dress appropriately: Bring dark clothing that you would not mind if they get stained. . Valuables: Do not bring any jewelry or valuables. Procedure appointments are reserved for interventional treatments only. Marland Kitchen No Prescription Refills. . No medication changes will be discussed during procedure  appointments. . No disability issues will be discussed. ____________________________________________________________________________________________   Sympathetic Nerve Block, Care After These instructions provide you with information about caring for yourself after your procedure. Your health care provider may also give you more specific instructions. Your treatment has been planned according to current medical practices, but problems sometimes occur. Call your health care provider if you have any problems or questions after your procedure. What can I expect after the procedure? After your procedure, it is common for the area where the medicine was injected to be:  Sore.  Warm.  Weak.  Numb.  If the injection was made in your neck, you may also have:  Voice changes.  A droopy eyelid.  Trouble swallowing.  A stuffy nose.  Follow these instructions at home:  For the first 24 hours after your procedure: ? Do not drive. ? Rest. ? Avoid activities that require a lot of energy.  Keep track of the amount of pain relief that you feel and how long it lasts.  Do not apply heat near or over the injection sites.  Do not take a bath or soak in water, such as in a pool or lake, until your health care provider approves.  Take over-the-counter and prescription medicines only as told by your health care provider.  If you have trouble swallowing, take small bites when eating and small sips of water when drinking until you are able to swallow normally.  Keep all follow-up visits as told by your health care provider. This is important. Contact a health care provider if:  You have numbness that lasts longer than 8 hours.  You continue to have pain for more than  24 hours after your procedure.  You have worsening pain or swelling around an injection site.  There are red streaks around an injection site. Get help right away if:  You cannot swallow.  You have chest pain.  You  have trouble breathing. This information is not intended to replace advice given to you by your health care provider. Make sure you discuss any questions you have with your health care provider. Document Released: 04/28/2014 Document Revised: 05/20/2016 Document Reviewed: 04/07/2016 Elsevier Interactive Patient Education  2018 [employer]. GENERAL RISKS AND COMPLICATIONS  What are the risk, side effects and possible complications? Generally speaking, most procedures are safe.  However, with any procedure there are risks, side effects, and the possibility of complications.  The risks and complications are dependent upon the sites that are lesioned, or the type of nerve block to be performed.  The closer the procedure is to the spine, the more serious the risks are.  Great care is taken when placing the radio frequency needles, block needles or lesioning probes, but sometimes complications can occur. 1. Infection: Any time there is an injection through the skin, there is a risk of infection.  This is why sterile conditions are used for these blocks.  There are four possible types of infection. 1. Localized skin infection. 2. Central Nervous System Infection-This can be in the form of Meningitis, which can be deadly. 3. Epidural Infections-This can be in the form of an epidural abscess, which can cause pressure inside of the spine, causing compression of the spinal cord with subsequent paralysis. This would require an emergency surgery to decompress, and there are no guarantees that the patient would recover from the paralysis. 4. Discitis-This is an infection of the intervertebral discs.  It occurs in about 1% of discography procedures.  It is difficult to treat and it may lead to surgery.        2. Pain: the needles have to go through skin and soft tissues, will cause soreness.       3. Damage to internal structures:  The nerves to be lesioned may be near blood vessels or    other nerves which can  be potentially damaged.       4. Bleeding: Bleeding is more common if the patient is taking blood thinners such as  aspirin, Coumadin, Ticiid, Plavix, etc., or if he/she have some genetic predisposition  such as hemophilia. Bleeding into the spinal canal can cause compression of the spinal  cord with subsequent paralysis.  This would require an emergency surgery to  decompress and there are no guarantees that the patient would recover from the  paralysis.       5. Pneumothorax:  Puncturing of a lung is a possibility, every time a needle is introduced in  the area of the chest or upper back.  Pneumothorax refers to free air around the  collapsed lung(s), inside of the thoracic cavity (chest cavity).  Another two possible  complications related to a similar event would include: Hemothorax and Chylothorax.   These are variations of the Pneumothorax, where instead of air around the collapsed  lung(s), you may have blood or chyle, respectively.       6. Spinal headaches: They may occur with any procedures in the area of the spine.       7. Persistent CSF (Cerebro-Spinal Fluid) leakage: This is a rare problem, but may occur  with prolonged intrathecal or epidural catheters either due to the formation of a fistulous  track or a dural tear.       8. Nerve damage: By working so close to the spinal cord, there is always a possibility of  nerve damage, which could be as serious as a permanent spinal cord injury with  paralysis.       9. Death:  Although rare, severe deadly allergic reactions known as "Anaphylactic  reaction" can occur to any of the medications used.      10. Worsening of the symptoms:  We can always make thing worse.  What are the chances of something like this happening? Chances of any of this occuring are extremely low.  By statistics, you have more of a chance of getting killed in a motor vehicle accident: while driving to the hospital than any of the above occurring .  Nevertheless, you should be  aware that they are possibilities.  In general, it is similar to taking a shower.  Everybody knows that you can slip, hit your head and get killed.  Does that mean that you should not shower again?  Nevertheless always keep in mind that statistics do not mean anything if you happen to be on the wrong side of them.  Even if a procedure has a 1 (one) in a 1,000,000 (million) chance of going wrong, it you happen to be that one..Also, keep in mind that by statistics, you have more of a chance of having something go wrong when taking medications.  Who should not have this procedure? If you are on a blood thinning medication (e.g. Coumadin, Plavix, see list of "Blood Thinners"), or if you have an active infection going on, you should not have the procedure.  If you are taking any blood thinners, please inform your physician.  How should I prepare for this procedure?  Do not eat or drink anything at least six hours prior to the procedure.  Bring a driver with you .  It cannot be a taxi.  Come accompanied by an adult that can drive you back, and that is strong enough to help you if your legs get weak or numb from the local anesthetic.  Take all of your medicines the morning of the procedure with just enough water to swallow them.  If you have diabetes, make sure that you are scheduled to have your procedure done first thing in the morning, whenever possible.  If you have diabetes, take only half of your insulin dose and notify our nurse that you have done so as soon as you arrive at the clinic.  If you are diabetic, but only take blood sugar pills (oral hypoglycemic), then do not take them on the morning of your procedure.  You may take them after you have had the procedure.  Do not take aspirin or any aspirin-containing medications, at least eleven (11) days prior to the procedure.  They may prolong bleeding.  Wear loose fitting clothing that may be easy to take off and that you would not mind if it  got stained with Betadine or blood.  Do not wear any jewelry or perfume  Remove any nail coloring.  It will interfere with some of our monitoring equipment.  NOTE: Remember that this is not meant to be interpreted as a complete list of all possible complications.  Unforeseen problems may occur.  BLOOD THINNERS The following drugs contain aspirin or other products, which can cause increased bleeding during surgery and should not be taken for 2 weeks prior to and 1 week after surgery.  If you should need take something for relief of minor pain, you may take acetaminophen which is found in Tylenol,m Datril, Anacin-3 and Panadol. It is not blood thinner. The products listed below are.  Do not take any of the products listed below in addition to any listed on your instruction sheet.  A.P.C or A.P.C with Codeine Codeine Phosphate Capsules #3 Ibuprofen Ridaura  ABC compound Congesprin Imuran rimadil  Advil Cope Indocin Robaxisal  Alka-Seltzer Effervescent Pain Reliever and Antacid Coricidin or Coricidin-D  Indomethacin Rufen  Alka-Seltzer plus Cold Medicine Cosprin Ketoprofen S-A-C Tablets  Anacin Analgesic Tablets or Capsules Coumadin Korlgesic Salflex  Anacin Extra Strength Analgesic tablets or capsules CP-2 Tablets Lanoril Salicylate  Anaprox Cuprimine Capsules Levenox Salocol  Anexsia-D Dalteparin Magan Salsalate  Anodynos Darvon compound Magnesium Salicylate Sine-off  Ansaid Dasin Capsules Magsal Sodium Salicylate  Anturane Depen Capsules Marnal Soma  APF Arthritis pain formula Dewitt's Pills Measurin Stanback  Argesic Dia-Gesic Meclofenamic Sulfinpyrazone  Arthritis Bayer Timed Release Aspirin Diclofenac Meclomen Sulindac  Arthritis pain formula Anacin Dicumarol Medipren Supac  Analgesic (Safety coated) Arthralgen Diffunasal Mefanamic Suprofen  Arthritis Strength Bufferin Dihydrocodeine Mepro Compound Suprol  Arthropan liquid Dopirydamole Methcarbomol with Aspirin Synalgos  ASA  tablets/Enseals Disalcid Micrainin Tagament  Ascriptin Doan's Midol Talwin  Ascriptin A/D Dolene Mobidin Tanderil  Ascriptin Extra Strength Dolobid Moblgesic Ticlid  Ascriptin with Codeine Doloprin or Doloprin with Codeine Momentum Tolectin  Asperbuf Duoprin Mono-gesic Trendar  Aspergum Duradyne Motrin or Motrin IB Triminicin  Aspirin plain, buffered or enteric coated Durasal Myochrisine Trigesic  Aspirin Suppositories Easprin Nalfon Trillsate  Aspirin with Codeine Ecotrin Regular or Extra Strength Naprosyn Uracel  Atromid-S Efficin Naproxen Ursinus  Auranofin Capsules Elmiron Neocylate Vanquish  Axotal Emagrin Norgesic Verin  Azathioprine Empirin or Empirin with Codeine Normiflo Vitamin E  Azolid Emprazil Nuprin Voltaren  Bayer Aspirin plain, buffered or children's or timed BC Tablets or powders Encaprin Orgaran Warfarin Sodium  Buff-a-Comp Enoxaparin Orudis Zorpin  Buff-a-Comp with Codeine Equegesic Os-Cal-Gesic   Buffaprin Excedrin plain, buffered or Extra Strength Oxalid   Bufferin Arthritis Strength Feldene Oxphenbutazone   Bufferin plain or Extra Strength Feldene Capsules Oxycodone with Aspirin   Bufferin with Codeine Fenoprofen Fenoprofen Pabalate or Pabalate-SF   Buffets II Flogesic Panagesic   Buffinol plain or Extra Strength Florinal or Florinal with Codeine Panwarfarin   Buf-Tabs Flurbiprofen Penicillamine   Butalbital Compound Four-way cold tablets Penicillin   Butazolidin Fragmin Pepto-Bismol   Carbenicillin Geminisyn Percodan   Carna Arthritis Reliever Geopen Persantine   Carprofen Gold's salt Persistin   Chloramphenicol Goody's Phenylbutazone   Chloromycetin Haltrain Piroxlcam   Clmetidine heparin Plaquenil   Cllnoril Hyco-pap Ponstel   Clofibrate Hydroxy chloroquine Propoxyphen         Before stopping any of these medications, be sure to consult the physician who ordered them.  Some, such as Coumadin (Warfarin) are ordered to prevent or treat serious conditions  such as "deep thrombosis", "pumonary embolisms", and other heart problems.  The amount of time that you may need off of the medication may also vary with the medication and the reason for which you were taking it.  If you are taking any of these medications, please make sure you notify your pain physician before you undergo any procedures.

## 2017-07-05 ENCOUNTER — Encounter: Payer: BLUE CROSS/BLUE SHIELD | Attending: Physician Assistant | Admitting: Physician Assistant

## 2017-07-05 DIAGNOSIS — K219 Gastro-esophageal reflux disease without esophagitis: Secondary | ICD-10-CM | POA: Insufficient documentation

## 2017-07-05 DIAGNOSIS — F17210 Nicotine dependence, cigarettes, uncomplicated: Secondary | ICD-10-CM | POA: Insufficient documentation

## 2017-07-05 DIAGNOSIS — F411 Generalized anxiety disorder: Secondary | ICD-10-CM | POA: Insufficient documentation

## 2017-07-05 DIAGNOSIS — I10 Essential (primary) hypertension: Secondary | ICD-10-CM | POA: Diagnosis not present

## 2017-07-05 DIAGNOSIS — L89314 Pressure ulcer of right buttock, stage 4: Secondary | ICD-10-CM | POA: Insufficient documentation

## 2017-07-05 DIAGNOSIS — J449 Chronic obstructive pulmonary disease, unspecified: Secondary | ICD-10-CM | POA: Diagnosis not present

## 2017-07-05 DIAGNOSIS — E1142 Type 2 diabetes mellitus with diabetic polyneuropathy: Secondary | ICD-10-CM | POA: Insufficient documentation

## 2017-07-06 NOTE — Progress Notes (Signed)
[name], [name] ([id number]) Visit Report for 07/05/2017 Chief Complaint Document Details Patient Name: Katelyn, [name] Date of Service: 07/05/2017 8:00 AM Medical Record Number: [id number] Patient Account Number: [id number] Date of Birth/Sex: [birth year] (56 y.o. Female) Treating RN: [name], [name] Primary Care Provider: Joen [name] Other Clinician: Referring Provider: Joen [name] Treating Provider/Extender: Linwood Dibbles, HOYT Weeks in Treatment: 76 Information Obtained from: Patient Chief Complaint Patient is here for follow-up of a relation of her right ischial pressure ulcer Electronic Signature(s) Signed: 07/05/2017 11:49:56 AM By: Lenda Kelp PA-C Entered By: Lenda Kelp on 07/05/2017 08:29:07 [name] ([id number]) -------------------------------------------------------------------------------- HPI Details Patient Name: [name] Date of Service: 07/05/2017 8:00 AM Medical Record Number: [id number] Patient Account Number: [id number] Date of Birth/Sex: [birth year] (56 y.o. Female) Treating RN: [name], [name] Primary Care Provider: Joen [name] Other Clinician: Referring Provider: Joen [name] Treating Provider/Extender: Linwood Dibbles, HOYT Weeks in Treatment: 41 History of Present Illness Location: bilateral gluteal ulcerations and left popliteal ulceration with tunneling Quality: adits to intermittent aching to ischial ulcers, no discomfort to sinus tract Severity: right iscial ulcer with increased depth Duration: chronic ulcers to bilateral ischial ulcers and sinus tract Timing: The pain is intermittent in severity as far as how intense it becomes but is present all the time. Manipulationn makes this worse. Context: The wound occurred when the patient had a fall and was unconscious for about 48 hours laying on the floor and she had pressure injury at that stage. Modifying Factors: Other treatment(s) tried include:as noted below she has been seen by visiting  wound care physicians or nurse practitioners and details have been noted Associated Signs and Symptoms: has yet to receive offloading cushions and mattress overlay HPI Description: 56 year old patient who was seen by visiting [name] wound care specialist for a wound on both her buttock and was found to have an unstageable wound on the right buttock for about 2 months. I understand that she had a fall and was laying on the floor for about 48 hours before she was found and taken to the ICU and had a long injury to her gluteal area from pressure and also had broken her right humerus. She has had a right proximal humerus fracture and has been followed up with orthopedics recently. The patient has a past medical history of type 2 diabetes mellitus, paraparesis, acute pyelonephritis, GERD, hypertension, glaucoma, chronic pain, anxiety neurosis, nicotine dependence, COPD. the patient had some debridement done and was to operative was recommended to use Silvadene dressing and offloading. She is a smoker and occasionally smokes a few cigarettes. the patient requested a second opinion for months and is here to discuss her care. 09/21/16; the patient re-presents from home today for review of 3 different wounds. I note that she was seen in the clinic here in July at which time she had bilateral buttock wounds. It was apparently suggested at that time that she use a wound VAC bridged to both wounds just near the initial tuberosity's bilaterally which she refused. The history was a bit difficult to put together. Apparently this patient became ill at the end of April of this year. She was found sitting on the floor she had apparently been for 2 days and subsequently admitted to hospital from 04/23/16 through 05/02/16 and at that point she was critically ill ultimately having sepsis secondary to UTI, nontraumatic rhabdomyolysis and diabetic ketoacidosis. She had acute renal failure and I think required ICU care  including intubation. Patient states her wounds actually  started at that point on the bilateral issue tuberosities however in reviewing the discharge summary from 5/8 I see no reference to wounds at that point. It did state that she had left lower extremity cellulitis however. Reviewing Epic I see no relevant x-rays. It would appear that her discharge creatinine was within the normal range and indeed on 9/15 her creatinine has remained normal. [facility] There the wounds on her bilateral Buttocks were dressed. Only just before her discharge from the nursing facility she developed an "knot" which was interpreted as cellulitis on the posterior aspect of her Bankert, Adeana L. ([id number]) left knee she was given antibiotics. Apparently sometime late in July a this actually opened and became a wound at home health care was tending to however she is still having purulent drainage coming from this and by my understanding the wound depth is actually become unmeasurable. I am not really clear about what home health has been placing in any of these wound areas. The patient states that is something with silver and it. She is not been systemically unwell no fever or chills her appetite is good. She is a diabetic poorly controlled however she states that her recent blood sugars at home have been in the low to mid 100s. 09/28/16 On evaluation today patient appears to continue to exhibit the 3 areas of ulceration that were noted previous. She did have an x-ray of the right pelvis which showed evidence of potential soft tissue infection but no obvious osteomyelitis. There was a discussion last office visit concerning the possibility of a wound VAC. Witth that being said the x-ray report suggested that an MRI may be more appropriate to further evaluate the extent. Subsequently in regard to the wound over the popliteal portion of the left lower extremity  with tunneling at 12:00 the CT scan that was ordered was denied by insurance as they state the patient has not had x-rays prior to advanced imaging. Patient states that she is frustrated with the situation overall. 10/05/16 in the interval since I last saw this patient last week she has had the x-ray of the knee performed. I did review that x-ray today and fortunately shows no evidence of osteomyelitis or other acute abnormality at this point in time. She continues to have the opening iin the posterior left popliteal space with tracking proximal up the posterior thigh. Nothing seems to have worsened but it also seems to have not improved. The same is true in regard to the right pressure ulcer over the gluteal region which extends toward the ischium. The left gluteal pressure ulcer actually appears to be doing somewhat better my opinion there is some necrotic slough but overall this appears fairly well. She tells me thatt she has some discomfort especially when home health is helping her with dressing changes as they do not know her. At worse she rates her pain to be a 5 out of 10 right now it's more like a 1 out of 10. 10/07/16; still the patient has 3 different wound areas. She has a deep stage IV wound over her right ischial tuberosity. She is due to have an MRI next week. The wound over her left ischial tuberosity is more superficial and underwent debridement today. Finally she has a small open area in her left popliteal fossa the probes on measurably forward superiorly. Still a lot of drainage coming out of this. The CT scan that I ordered 3 weeks ago was  questioned by her insurance company wanting a plain x-ray first. As I understand things result of this is nothing has been done in 3 weeks in terms of imaging the thigh and she has an MRI booked of this along with her pelvis for next week line 10/12/16; patient has a deep probing wound over the right ischiall tuberosity, stage III wound over  the left visual tuberosity and a draining sinus in her left popliteal fossa. None of this much different from when I saw this 3 weeks ago. We have been using silver rope to the right ischial wound and a draining area in the left popliteal fossa. Plain silver alginate to the area on the left ischial tuberosity 10/19/16; the patient's wounds are essentially unchanged although the area on the left lower gluteal is actually improved. Our intake nurse noted drainage from the right initial tuberosity probing wound as well as the draining area in the left popliteal fossa. Both of these were cultured. She had x-rays I think at the insistence of her insurance company on 09/23/16 x-ray of the pelvis was not particularly helpful she did have soft tissue air over the right lower pelvis although with the depth of this wound this is not surprising. An x-ray of her left knee did not show any specific abnormalities. We are still using silver alginate to these wound areas. Her MRI is booked for 10/27 10/26/16; cultures of the purulent drainage in her right initial tuberosity wound grew moderate Proteus and few staph aureus. The same organisms were cultured out of the left knee sinus tract posteriorly. The staph aureus is MRSA. I had started her on Augmentin last week I added doxycycline. The MRI of the left lower extremity and pelvis was finally done. The MRI of the femur showed subcutaneous soft tissue swelling edema fluid and myositis in the vastus lateralis muscle but no soft tissue abscess septic arthritis or osteomyelitis. MRI of the pelvis showed the left wound to be more expensive extending down to the bone Riehle, Levi L. ([id number]) there was osteomyelitis. Left hamstring tendons were also involved. No septic arthritis involving the hip. The decubitus ulcer on the right side showed no definite osteomyelitis or abscess.. The right hip wound is actually the one the probes 6 cm downward. But the MRI showing  infection including osteomyelitis on the left explains the draining sinus in the popliteal fossa on the left. She did have antibiotics in the hospitalization last time and this extended into her nursing home stay but I'm not exactly sure what antibiotics and for what duration. According the patient this did include vancomycin with considerable effort of our staff we are able to get the patient into see Dr. Sampson Goon today. There were transportation difficulties. Her mother had open heart surgery and is in the ICU in [place] therefore her brother was unable to transport. Dr. Jarrett Ables office graciously arranged time to see her today. From my point of view she is going to require IV vancomycin plus perhaps a third generation cephalosporin. I plan to keep her on doxycycline and Augmentin until the IV antibiotics can be arranged. 11/02/2016 - Eola presents today for management of ulcers; She saw Dr. 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 She is to have the PICC line placed today and receive the initial dose for both antibiotics today. She has yet to receive the offloading chair cushion and/or mattress overlay from home health, apparently this has been  a 3 week process. I encouraged her to speak to home health regarding this matter, along with offering home health to contact the wouns care center with any questions or concerns. The left ischial pressure ulcer continues to imporve, while to right ischial ulcer has increased in depth. The popliteal fossa sinus tract remains unmeasurable due to the limitation of depth measurement (tract extends beyond our measuring devices). 11-16-2016 Ms. Garin presents today for evaluation and management of bilateral ischial stage IV pressure ulcers and sinus tract to the left popliteal fossa. she is under the care of Dr. Sampson Goon for IV antibiotic therapy; she  states that the vancomycin was placed on hold and will be restarted at a lower dose based on her renal function. She continues taking Zosyn in addition to the vancomycin. She also states that she has yet to receive offloading cushions from home health, according to her she does not qualify for these offloading cushions because "the ulcers are unstageable ". We will contact the home health agency today to lend clarity regarding her pressure ulcers. The left popliteal fossa sinus tract continues to be a measurable as it extends beyond the length of our measuring devices.. 11/30/16; the patient is still on vancomycin and Zosyn. The depth of the draining sinus behind her left popliteal fossa is down to 4 cm although there is still serosanguineous drainage coming out of this. She saw Dr. Sampson Goon of infectious disease yesterday the idea is to weeks more of IV antibiotics and then oral antibiotics although I have not read his note. The area on the left gluteal fold is just about healed. She has a 6 cm draining sinus over the right initial tuberosity although I cannot feel bone at the base of this. As far as the patient is aware she has not had a recheck of her inflammatory markers. 12/07/16; patient is on vancomycin and Zosyn appointment with Dr. Sampson Goon on the 19th at which point the patient expects to have a change in antibiotics. Remarkable improvement over the wound over the left ischial tuberosity which is just about closed. The draining sinus in her popliteal fossa has 0.4 cm in depth. The area on the right ischial tuberosity still probes down 7 cm. This is closed and overall wound dimensions but not depth. 12/14/16; patient is completing her vancomycin and Zosyn and per her she is going to transition to Bactrim and Augmentin for another 3 weeks. The area on her left gluteal fold is closed except for some skin tears. The area behind her left knee is no longer has any depth. The only remaining  area that is of clinical concern is on the right gluteal fold probing towards the right ischial tuberosity. Today this measures 6.9 cm in depth. Very gritty surface 12/21/16; patient is now on Bactrim and Augmentin as directed by infectious disease. This should be for another 2 weeks. The area in her left gluteal fold and left popliteal are closed over and fully healed. Measurements today at 7 cm in the right buttock wound is unchanged from last week. 12/27/16; patient is on Bactrim and Augmentin for another week as directed by infectious disease. She has Annas, Quianna L. ([id number]) completed her IV antibiotics. She is not been systemically unwell no fever no chills. The area on her right buttock measured over 6 cm in depth. There is no palpable bone. No evidence of surrounding soft tissue infection. She is complaining of tongue irritation and has a history of thrush 01/04/17; patient is been back  to see Dr. Sampson Goon, her Augmentin was stopped but he continued the Bactrim for another 3 weeks. Depth of the wound is 6.7 cm there is been no major change in either direction. She is not receive the wound VAC from home health I think because of confusion about who is supposed to provided will actually talk to the [facility] agency [facility] The net no major change. 01/18/17; patient obtained her wound VAC about 10 days ago however for some reason it was not actually put on the wound. She is therefore here for [place] to apply this I guess. No other issues are noted. She is not complaining of pain fever drainage 02/01/17; patient is here now having the wound VAC or 3-4 weeks to a deep pressure area over the right initial tuberosity. This measures 6 cm in depth today which is about half a centimeter better than 2 weeks ago. There is no evidence she is systemically unwell no fever no chills no pain around the area. 02/15/17; I follow this patient every 2 weeks for a deep area over the right ischial  tuberosity. This measures 5.5 cm today which is a continued improvement of 1.2 cm from 1/10 and down 0.5 cm from her visit 2 weeks ago 03/01/17; continued difficult area over the right initial tuberosity using the wound VAC. Depth today of 5.1 cm which is improved. Does not appear to be a lot of drainage in the canister. Antibiotics were finally stopped by Dr. Sampson Goon bactrim[]  and inflammatory markers have been repeated 03/15/17; fall this lady every 2 weeks for a difficult area over her lower right gluteal area/ischial tuberosity. Depth today at 4.6 cm. This is a slow but steady improvement in the depth of this wound. Although we have labeled this as a pressure ulcer there may have been an underlying infection here at one point before we saw her. She had osteomyelitis on the left extensively which is since resolved 03/29/17- patient is here for follow-up evaluation of her right ischial pressure ulcer. She continues with the wound VAC and home health. According to the nurse home health has been using less foams and appropriate and we will instruct accordingly. The patient continues to smoke, approximately 10 cigarettes a day. She has been advised to decrease that in half by her next appointment, with a goal of complete cessation. She states her blood sugars have been consistently less than 150. She states that she spends most of her day position left lateral or prone. She does have an air mattress on her bed, she does not have an mattress for her chair, she states she cannot afford this. 04/12/17; patient is here for evaluation of her right ischial pressure ulcer. We continue to use a wound VAC with minimal improvement today the depth of this measuring 4.4 cm versus 4.6 cm 2 weeks ago. We are using a [employer] wound VAC on this area. There is not excessive drainage no pain. The patient tells me she tries to keep off this in bed but is up in the wheelchair for 2 hours a day. She is limited in no her  overall ambulation but is improving and apparently is getting bilateral lower extremity braces which she hopes will improve her ability to walk independently. 05/10/17; Depth at 3.3cm. Improved 05/24/17; depth at 3.5 cm. This is not improved since last time. Not clear if they are using collagen under the foam 06/07/17; depth that 2.9 which is a slight improvement. Still using the wound VAC. 06/21/17; depth  is 2.9 cm which is exactly the same as last time. Also the appearance of this wound is completely the same. We have been using silver collagen under a wound VAC. 07/05/17 patient presents today for reevaluation concerning her right Ischial wound. She had switched insurance companies and so it does appear that the Wound VAC needs to be reauthorized which we are working on this morning. Nonetheless her wound has been saying about the same we have continued to use the silver collagen underneath the wound VAC. She has no discomfort. Electronic Signature(s) Signed: 07/05/2017 11:49:56 AM By: Nada Maclachlan, Maudine L. ([id number]) Entered By: Lenda Kelp on 07/05/2017 09:16:41 Renz, [name] ([id number]) -------------------------------------------------------------------------------- Physical Exam Details Patient Name: [name] Date of Service: 07/05/2017 8:00 AM Medical Record Number: [id number] Patient Account Number: [id number] Date of Birth/Sex: [birth year] (56 y.o. Female) Treating RN: [name], [name] Primary Care Provider: Joen [name] Other Clinician: Referring Provider: Joen [name] Treating Provider/Extender: STONE III, HOYT Weeks in Treatment: 41 Constitutional Obese and well-hydrated in no acute distress. Respiratory normal breathing without difficulty. Psychiatric this patient is able to make decisions and demonstrates good insight into disease process. Alert and Oriented x 3. pleasant and cooperative. Notes Patient's wound bed cannot be visualized although on  evaluation during probing I could not probe to bone and there appears to be a decent wound bed present on which for this to fill in. There is no evidence of infection. Electronic Signature(s) Signed: 07/05/2017 11:49:56 AM By: Lenda Kelp PA-C Entered By: Lenda Kelp on 07/05/2017 09:17:42 Achille, [name] ([id number]) -------------------------------------------------------------------------------- Physician Orders Details Patient Name: [name] Date of Service: 07/05/2017 8:00 AM Medical Record Number: [id number] Patient Account Number: [id number] Date of Birth/Sex: 12-May-1961 (56 y.o. Female) Treating RN: [name], [name] Primary Care Provider: Joen [name] Other Clinician: Referring Provider: Joen [name] Treating Provider/Extender: Linwood Dibbles, HOYT Weeks in Treatment: 68 Verbal / Phone Orders: Yes Clinician: Ashok [name], [name] Read Back and Verified: Yes Diagnosis Coding ICD-10 Coding Code Description L89.314 Pressure ulcer of right buttock, stage 4 E11.42 Type 2 diabetes mellitus with diabetic polyneuropathy Wound Cleansing Wound #3 Right Gluteal fold o Clean wound with Normal Saline. o Cleanse wound with mild soap and water Anesthetic Wound #3 Right Gluteal fold o Topical Lidocaine 4% cream applied to wound bed prior to debridement - clinic use only Skin Barriers/Peri-Wound Care Wound #3 Right Gluteal fold o Skin Prep Primary Wound Dressing Wound #3 Right Gluteal fold o Dry Gauze - over any bleeding areas o Prisma Ag - (silver collagen) moisten with saline place in wound all the way to wound bed under wound vac then place the white foam Dressing Change Frequency Wound #3 Right Gluteal fold o Three times weekly - HHRN to change MONDAY, and FRIDAY and pt to come to wound clinic on [facility], every other week. Follow-up Appointments Wound #3 Right Gluteal fold o Return Appointment in 2 weeks. Off-Loading Wound #3 Right Gluteal fold Reister, Berneta  L. ([id number]) o Roho cushion for wheelchair - *********[facility] to order*********** Home Health Wound #3 Right Gluteal fold o Continue Home Health Visits - [facility] *****Please order ROHO cushion****** o Home Health Nurse may visit PRN to address patientos wound care needs. o FACE TO FACE ENCOUNTER: MEDICARE and MEDICAID PATIENTS: I certify that this patient is under my care and that I had a face-to-face encounter that meets the physician face-to-face encounter requirements with this patient on this date. The encounter with  the patient was in whole or in part for the following MEDICAL CONDITION: (primary reason for Home Healthcare) MEDICAL NECESSITY: I certify, that based on my findings, NURSING services are a medically necessary home health service. HOME BOUND STATUS: I certify that my clinical findings support that this patient is homebound (i.e., Due to illness or injury, pt requires aid of supportive devices such as crutches, cane, wheelchairs, walkers, the use of special transportation or the assistance of another person to leave their place of residence. There is a normal inability to leave the home and doing so requires considerable and taxing effort. Other absences are for medical reasons / religious services and are infrequent or of short duration when for other reasons). o If current dressing causes regression in wound condition, may D/C ordered dressing product/s and apply Normal Saline Moist Dressing daily until next Wound Healing Center / Other MD appointment. Notify Wound Healing Center of regression in wound condition at [phone]. o Please direct any NON-WOUND related issues/requests for orders to patient's Primary Care Physician Negative Pressure Wound Therapy Wound #3 Right Gluteal fold o Wound VAC settings at 125/130 mmHg continuous pressure. Use BLACK/GREEN foam to wound cavity. Use WHITE foam to fill any tunnel/s and/or  undermining. Change VAC dressing 3 X WEEK. Change canister as indicated when full. Nurse may titrate settings and frequency of dressing changes as clinically indicated. - Prisma Ag (silver collagen) moisten with saline place in wound all the way to wound bed under wound vac then white foam o Home Health Nurse may d/c VAC for s/s of increased infection, significant wound regression, or uncontrolled drainage. Notify Wound Healing Center at [phone]. o Number of foam/gauze pieces used in the dressing = Medications-please add to medication list. Wound #3 Right Gluteal fold o Other: - Vitamin C, Zinc, Multivitamins Notes I am going to recommend that we continue with the wound VAC and this was placed back on as we got conformation that her insurance is going to continue to cover this. I think this is a good plan. We will see her for follow-up evaluation in two weeks. If anything worsens in the interim she will contact our office for additional recommendations. Electronic Signature(s) BENECIA, ASTON ([id number]) Signed: 07/05/2017 11:49:56 AM By: Lenda Kelp PA-C Entered By: Lenda Kelp on 07/05/2017 11:47:37 Scarfone, [name] ([id number]) -------------------------------------------------------------------------------- Problem List Details Patient Name: [name] Date of Service: 07/05/2017 8:00 AM Medical Record Number: [id number] Patient Account Number: [id number] Date of Birth/Sex: 1961-04-27 (56 y.o. 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 ([id number]) Whittington, Bianney Elbert Ewings ([id number]) -------------------------------------------------------------------------------- Progress Note Details Patient Name: [name] Date of Service: 07/05/2017 8:00 AM Medical Record Number: [id number] Patient Account Number: [id number] Date of Birth/Sex: 1961-12-15 (56 y.o. Female)  Treating RN: Phillis Haggis Primary Care Provider: Joen [name] Other Clinician: Referring Provider: Joen [name] Treating Provider/Extender: Linwood Dibbles, HOYT Weeks in Treatment: 41 Subjective Chief Complaint Information obtained from Patient Patient is here for follow-up of a relation of her right ischial pressure ulcer History of Present Illness (HPI) The following HPI elements were documented for the patient's wound: Location: bilateral gluteal ulcerations and left popliteal ulceration with tunneling Quality: adits to intermittent aching to ischial ulcers, no discomfort to sinus tract Severity: right iscial ulcer with increased depth Duration: chronic ulcers to bilateral ischial ulcers and sinus tract Timing: The pain is intermittent in severity as far as how intense it becomes but is present all the time. Manipulationn makes this worse. Context: The wound occurred when the patient had a fall and was unconscious for about 48 hours laying on the floor and she had pressure injury at that stage. Modifying Factors: Other treatment(s) tried include:as noted  below she has been seen by visiting wound care physicians or nurse practitioners and details have been noted Associated Signs and Symptoms: has yet to receive offloading cushions and mattress overlay 56 year old patient who was seen by visiting [name] wound care specialist for a wound on both her buttock and was found to have an unstageable wound on the right buttock for about 2 months. I understand that she had a fall and was laying on the floor for about 48 hours before she was found and taken to the ICU and had a long injury to her gluteal area from pressure and also had broken her right humerus. She has had a right proximal humerus fracture and has been followed up with orthopedics recently. The patient has a past medical history of type 2 diabetes mellitus, paraparesis, acute pyelonephritis, GERD, hypertension, glaucoma, chronic pain, anxiety neurosis, nicotine dependence, COPD. the patient had some debridement done and was to operative was recommended to use Silvadene dressing and offloading. She is a smoker and occasionally smokes a few cigarettes. the patient requested a second opinion for months and is here to discuss her care. 09/21/16; the patient re-presents from home today for review of 3 different wounds. I note that she was seen in the clinic here in July at which time she had bilateral buttock wounds. It was apparently suggested at that time that she use a wound VAC bridged to both wounds just near the initial tuberosity's bilaterally which she refused. The history was a bit difficult to put together. Apparently this patient became ill at the end of April of this year. She was found sitting on the floor she had apparently been for 2 days and subsequently admitted to hospital from 04/23/16 through 05/02/16 and at that point she was critically ill ultimately having sepsis Ingrum, Delania L. ([id number]) secondary to UTI, nontraumatic rhabdomyolysis and diabetic ketoacidosis. She had acute  renal failure and I think required ICU care including intubation. Patient states her wounds actually started at that point on the bilateral issue tuberosities however in reviewing the discharge summary from 5/8 I see no reference to wounds at that point. It did state that she had left lower extremity cellulitis however. Reviewing Epic I see no relevant x-rays. It would appear that her discharge creatinine was within the normal range and indeed on 9/15 her creatinine has remained normal. [facility] There the wounds on her bilateral Buttocks were dressed. Only just before her discharge from the nursing facility she developed an "knot" which was interpreted  as cellulitis on the posterior aspect of her left knee she was given antibiotics. Apparently sometime late in July a this actually opened and became a wound at home health care was tending to however she is still having purulent drainage coming from this and by my understanding the wound depth is actually become unmeasurable. I am not really clear about what home health has been placing in any of these wound areas. The patient states that is something with silver and it. She is not been systemically unwell no fever or chills her appetite is good. She is a diabetic poorly controlled however she states that her recent blood sugars at home have been in the low to mid 100s. 09/28/16 On evaluation today patient appears to continue to exhibit the 3 areas of ulceration that were noted previous. She did have an x-ray of the right pelvis which showed evidence of potential soft tissue infection but no obvious osteomyelitis. There was a discussion last office visit concerning the possibility of a wound VAC. Witth that being said the x-ray report suggested that an MRI may be more appropriate to further evaluate the extent. Subsequently in regard to the wound over the popliteal portion of the left  lower extremity with tunneling at 12:00 the CT scan that was ordered was denied by insurance as they state the patient has not had x-rays prior to advanced imaging. Patient states that she is frustrated with the situation overall. 10/05/16 in the interval since I last saw this patient last week she has had the x-ray of the knee performed. I did review that x-ray today and fortunately shows no evidence of osteomyelitis or other acute abnormality at this point in time. She continues to have the opening iin the posterior left popliteal space with tracking proximal up the posterior thigh. Nothing seems to have worsened but it also seems to have not improved. The same is true in regard to the right pressure ulcer over the gluteal region which extends toward the ischium. The left gluteal pressure ulcer actually appears to be doing somewhat better my opinion there is some necrotic slough but overall this appears fairly well. She tells me thatt she has some discomfort especially when home health is helping her with dressing changes as they do not know her. At worse she rates her pain to be a 5 out of 10 right now it's more like a 1 out of 10. 10/07/16; still the patient has 3 different wound areas. She has a deep stage IV wound over her right ischial tuberosity. She is due to have an MRI next week. The wound over her left ischial tuberosity is more superficial and underwent debridement today. Finally she has a small open area in her left popliteal fossa the probes on measurably forward superiorly. Still a lot of drainage coming out of this. The CT scan that I ordered 3 weeks ago was questioned by her insurance company wanting a plain x-ray first. As I understand things result of this is nothing has been done in 3 weeks in terms of imaging the thigh and she has an MRI booked of this along with her pelvis for next week line 10/12/16; patient has a deep probing wound over the right ischiall tuberosity,  stage III wound over the left visual tuberosity and a draining sinus in her left popliteal fossa. None of this much different from when I saw this 3 weeks ago. We have been using silver rope to the right ischial wound and a draining  area in the left popliteal fossa. Plain silver alginate to the area on the left ischial tuberosity 10/19/16; the patient's wounds are essentially unchanged although the area on the left lower gluteal is actually improved. Our intake nurse noted drainage from the right initial tuberosity probing wound as well as the draining area in the left popliteal fossa. Both of these were cultured. She had x-rays I think at the insistence of her insurance company on 09/23/16 x-ray of the pelvis was not particularly helpful she did have soft tissue air over the right lower pelvis although with the depth of this wound this is not surprising. An x-ray Elizondo, Lavonya L. ([id number]) of her left knee did not show any specific abnormalities. We are still using silver alginate to these wound areas. Her MRI is booked for 10/27 10/26/16; cultures of the purulent drainage in her right initial tuberosity wound grew moderate Proteus and few staph aureus. The same organisms were cultured out of the left knee sinus tract posteriorly. The staph aureus is MRSA. I had started her on Augmentin last week I added doxycycline. The MRI of the left lower extremity and pelvis was finally done. The MRI of the femur showed subcutaneous soft tissue swelling edema fluid and myositis in the vastus lateralis muscle but no soft tissue abscess septic arthritis or osteomyelitis. MRI of the pelvis showed the left wound to be more expensive extending down to the bone there was osteomyelitis. Left hamstring tendons were also involved. No septic arthritis involving the hip. The decubitus ulcer on the right side showed no definite osteomyelitis or abscess.. The right hip wound is actually the one the probes 6 cm downward.  But the MRI showing infection including osteomyelitis on the left explains the draining sinus in the popliteal fossa on the left. She did have antibiotics in the hospitalization last time and this extended into her nursing home stay but I'm not exactly sure what antibiotics and for what duration. According the patient this did include vancomycin with considerable effort of our staff we are able to get the patient into see Dr. Sampson Goon today. There were transportation difficulties. Her mother had open heart surgery and is in the ICU in [place] therefore her brother was unable to transport. Dr. Jarrett Ables office graciously arranged time to see her today. From my point of view she is going to require IV vancomycin plus perhaps a third generation cephalosporin. I plan to keep her on doxycycline and Augmentin until the IV antibiotics can be arranged. 11/02/2016 - Kaleigha presents today for management of ulcers; She saw Dr. 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 She is to have the PICC line placed today and receive the initial dose for both antibiotics today. She has yet to receive the offloading chair cushion and/or mattress overlay from home health, apparently this has been a 3 week process. I encouraged her to speak to home health regarding this matter, along with offering home health to contact the wouns care center with any questions or concerns. The left ischial pressure ulcer continues to imporve, while to right ischial ulcer has increased in depth. The popliteal fossa sinus tract remains unmeasurable due to the limitation of depth measurement (tract extends beyond our measuring devices). 11-16-2016 Ms. Okonski presents today for evaluation and management of bilateral ischial stage IV pressure ulcers and sinus tract to the left popliteal fossa. she is under the care of Dr. Sampson Goon for IV  antibiotic  therapy; she states that the vancomycin was placed on hold and will be restarted at a lower dose based on her renal function. She continues taking Zosyn in addition to the vancomycin. She also states that she has yet to receive offloading cushions from home health, according to her she does not qualify for these offloading cushions because "the ulcers are unstageable ". We will contact the home health agency today to lend clarity regarding her pressure ulcers. The left popliteal fossa sinus tract continues to be a measurable as it extends beyond the length of our measuring devices.. 11/30/16; the patient is still on vancomycin and Zosyn. The depth of the draining sinus behind her left popliteal fossa is down to 4 cm although there is still serosanguineous drainage coming out of this. She saw Dr. Sampson Goon of infectious disease yesterday the idea is to weeks more of IV antibiotics and then oral antibiotics although I have not read his note. The area on the left gluteal fold is just about healed. She has a 6 cm draining sinus over the right initial tuberosity although I cannot feel bone at the base of this. As far as the patient is aware she has not had a recheck of her inflammatory markers. 12/07/16; patient is on vancomycin and Zosyn appointment with Dr. Sampson Goon on the 19th at which point the patient expects to have a change in antibiotics. Remarkable improvement over the wound over the left ischial tuberosity which is just about closed. The draining sinus in her popliteal fossa has 0.4 cm in depth. The area on the right ischial tuberosity still probes down 7 cm. This is closed and overall wound dimensions but not depth. 12/14/16; patient is completing her vancomycin and Zosyn and per her she is going to transition to Bactrim Marmo, Maire L. ([id number]) and Augmentin for another 3 weeks. The area on her left gluteal fold is closed except for some skin tears. The area behind her left  knee is no longer has any depth. The only remaining area that is of clinical concern is on the right gluteal fold probing towards the right ischial tuberosity. Today this measures 6.9 cm in depth. Very gritty surface 12/21/16; patient is now on Bactrim and Augmentin as directed by infectious disease. This should be for another 2 weeks. The area in her left gluteal fold and left popliteal are closed over and fully healed. Measurements today at 7 cm in the right buttock wound is unchanged from last week. 12/27/16; patient is on Bactrim and Augmentin for another week as directed by infectious disease. She has completed her IV antibiotics. She is not been systemically unwell no fever no chills. The area on her right buttock measured over 6 cm in depth. There is no palpable bone. No evidence of surrounding soft tissue infection. She is complaining of tongue irritation and has a history of thrush 01/04/17; patient is been back to see Dr. Sampson Goon, her Augmentin was stopped but he continued the Bactrim for another 3 weeks. Depth of the wound is 6.7 cm there is been no major change in either direction. She is not receive the wound VAC from home health I think because of confusion about who is supposed to provided will actually talk to the [facility] health agency [facility] The net no major change. 01/18/17; patient obtained her wound VAC about 10 days ago however for some reason it was not actually put on the wound. She is therefore here for [place] to apply this I guess. No other  issues are noted. She is not complaining of pain fever drainage 02/01/17; patient is here now having the wound VAC or 3-4 weeks to a deep pressure area over the right initial tuberosity. This measures 6 cm in depth today which is about half a centimeter better than 2 weeks ago. There is no evidence she is systemically unwell no fever no chills no pain around the area. 02/15/17; I follow this patient every 2 weeks for a deep area over  the right ischial tuberosity. This measures 5.5 cm today which is a continued improvement of 1.2 cm from 1/10 and down 0.5 cm from her visit 2 weeks ago 03/01/17; continued difficult area over the right initial tuberosity using the wound VAC. Depth today of 5.1 cm which is improved. Does not appear to be a lot of drainage in the canister. Antibiotics were finally stopped by Dr. Sampson Goon bactrim[]  and inflammatory markers have been repeated 03/15/17; fall this lady every 2 weeks for a difficult area over her lower right gluteal area/ischial tuberosity. Depth today at 4.6 cm. This is a slow but steady improvement in the depth of this wound. Although we have labeled this as a pressure ulcer there may have been an underlying infection here at one point before we saw her. She had osteomyelitis on the left extensively which is since resolved 03/29/17- patient is here for follow-up evaluation of her right ischial pressure ulcer. She continues with the wound VAC and home health. According to the nurse home health has been using less foams and appropriate and we will instruct accordingly. The patient continues to smoke, approximately 10 cigarettes a day. She has been advised to decrease that in half by her next appointment, with a goal of complete cessation. She states her blood sugars have been consistently less than 150. She states that she spends most of her day position left lateral or prone. She does have an air mattress on her bed, she does not have an mattress for her chair, she states she cannot afford this. 04/12/17; patient is here for evaluation of her right ischial pressure ulcer. We continue to use a wound VAC with minimal improvement today the depth of this measuring 4.4 cm versus 4.6 cm 2 weeks ago. We are using a [employer] wound VAC on this area. There is not excessive drainage no pain. The patient tells me she tries to keep off this in bed but is up in the wheelchair for 2 hours a day. She is  limited in no her overall ambulation but is improving and apparently is getting bilateral lower extremity braces which she hopes will improve her ability to walk independently. 05/10/17; Depth at 3.3cm. Improved 05/24/17; depth at 3.5 cm. This is not improved since last time. Not clear if they are using collagen under the foam 06/07/17; depth that 2.9 which is a slight improvement. Still using the wound VAC. 06/21/17; depth is 2.9 cm which is exactly the same as last time. Also the appearance of this wound is completely the same. We have been using silver collagen under a wound VAC. ADDISEN, CHAPPELLE ([id number]) 07/05/17 patient presents today for reevaluation concerning her right Ischial wound. She had switched insurance companies and so it does appear that the Wound VAC needs to be reauthorized which we are working on this morning. Nonetheless her wound has been saying about the same we have continued to use the silver collagen underneath the wound VAC. She has no discomfort. Objective Constitutional Obese and well-hydrated in no acute  distress. Vitals Time Taken: 8:09 AM, Height: 63 in, Weight: 257 lbs, BMI: 45.5, Temperature: 98.3 F, Pulse: 90 bpm, Respiratory Rate: 18 breaths/min, Blood Pressure: 137/74 mmHg. Respiratory normal breathing without difficulty. Psychiatric this patient is able to make decisions and demonstrates good insight into disease process. Alert and Oriented x 3. pleasant and cooperative. General Notes: Patient's wound bed cannot be visualized although on evaluation during probing I could not probe to bone and there appears to be a decent wound bed present on which for this to fill in. There is no evidence of infection. Integumentary (Hair, Skin) Wound #3 status is Open. Original cause of wound was Pressure Injury. The wound is located on the Right Gluteal fold. The wound measures 1cm length x 1.4cm width x 2.9cm depth; 1.1cm^2 area and 3.189cm^3 volume. There is Fat  Layer (Subcutaneous Tissue) Exposed exposed. There is no tunneling or undermining noted. There is a large amount of serous drainage noted. The wound margin is distinct with the outline attached to the wound base. There is medium (34-66%) red granulation within the wound bed. There is a medium (34-66%) amount of necrotic tissue within the wound bed including Adherent Slough. The periwound skin appearance exhibited: Scarring, Maceration. The periwound skin appearance did not exhibit: Callus, Crepitus, Excoriation, Induration, Rash, Dry/Scaly, Atrophie Blanche, Cyanosis, Ecchymosis, Hemosiderin Staining, Mottled, Pallor, Rubor, Erythema. Periwound temperature was noted as No Abnormality. The periwound has tenderness on palpation. Assessment Mancia, Cherelle L. ([id number]) Active Problems ICD-10 L89.314 - Pressure ulcer of right buttock, stage 4 E11.42 - Type 2 diabetes mellitus with diabetic polyneuropathy Plan Wound Cleansing: Wound #3 Right Gluteal fold: Clean wound with Normal Saline. Cleanse wound with mild soap and water Anesthetic: Wound #3 Right Gluteal fold: Topical Lidocaine 4% cream applied to wound bed prior to debridement - clinic use only Skin Barriers/Peri-Wound Care: Wound #3 Right Gluteal fold: Skin Prep Primary Wound Dressing: Wound #3 Right Gluteal fold: Dry Gauze - over any bleeding areas Prisma Ag - (silver collagen) moisten with saline place in wound all the way to wound bed under wound vac then place the white foam Dressing Change Frequency: Wound #3 Right Gluteal fold: Three times weekly - HHRN to change MONDAY, and FRIDAY and pt to come to wound clinic on [facility], every other week. Follow-up Appointments: Wound #3 Right Gluteal fold: Return Appointment in 2 weeks. Off-Loading: Wound #3 Right Gluteal fold: Roho cushion for wheelchair - *********[facility] to order*********** Home Health: Wound #3 Right Gluteal fold: Continue Home Health Visits -  [facility] *****Please order [facility] cushion****** Home Health Nurse may visit PRN to address patient s wound care needs. FACE TO FACE ENCOUNTER: MEDICARE and MEDICAID PATIENTS: I certify that this patient is under my care and that I had a face-to-face encounter that meets the physician face-to-face encounter requirements with this patient on this date. The encounter with the patient was in whole or in part for the following MEDICAL CONDITION: (primary reason for Home Healthcare) MEDICAL NECESSITY: I certify, that based on my findings, NURSING services are a medically necessary home health service. HOME BOUND STATUS: I certify that my clinical findings support that this patient is homebound (i.e., Due to illness or injury, pt requires aid of supportive devices such as crutches, cane, wheelchairs, walkers, the use of special transportation or the assistance of another person to leave their place of residence. There is a Fehnel, Shweta L. ([id number]) normal inability to leave the home and doing so requires considerable and taxing  effort. Other absences are for medical reasons / religious services and are infrequent or of short duration when for other reasons). If current dressing causes regression in wound condition, may D/C ordered dressing product/s and apply Normal Saline Moist Dressing daily until next Wound Healing Center / Other MD appointment. Notify Wound Healing Center of regression in wound condition at [phone]. Please direct any NON-WOUND related issues/requests for orders to patient's Primary Care Physician Negative Pressure Wound Therapy: Wound #3 Right Gluteal fold: Wound VAC settings at 125/130 mmHg continuous pressure. Use BLACK/GREEN foam to wound cavity. Use WHITE foam to fill any tunnel/s and/or undermining. Change VAC dressing 3 X WEEK. Change canister as indicated when full. Nurse may titrate settings and frequency of dressing changes as clinically indicated. -  Prisma Ag (silver collagen) moisten with saline place in wound all the way to wound bed under wound vac then white foam Home Health Nurse may d/c VAC for s/s of increased infection, significant wound regression, or uncontrolled drainage. Notify [facility] at [phone]. Number of foam/gauze pieces used in the dressing = Medications-please add to medication list.: Wound #3 Right Gluteal fold: Other: - Vitamin C, Zinc, Multivitamins General Notes: I am going to recommend that we continue with the wound VAC and this was placed back on as we got conformation that her insurance is going to continue to cover this. I think this is a good plan. We will see her for follow-up evaluation in two weeks. If anything worsens in the interim she will contact our office for additional recommendations. Electronic Signature(s) Signed: 07/05/2017 11:49:56 AM By: Lenda Kelp PA-C Entered By: Lenda Kelp on 07/05/2017 11:47:52 [place], [name] ([id number]) -------------------------------------------------------------------------------- SuperBill Details Patient Name: [name] Date of Service: 07/05/2017 Medical Record Number: [id number] Patient Account Number: [id number] Date of Birth/Sex: [birth year] (56 y.o. 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

## 2017-07-07 NOTE — Progress Notes (Signed)
[name], [name] ([id number]) Visit Report for 07/05/2017 Arrival Information Details Patient Name: [name], [name] Date of Service: 07/05/2017 8:00 AM Medical Record Number: [id number] Patient Account Number: [id number] Date of Birth/Sex: July 18, 1961 (56 y.o. Female) Treating RN: Carolyne Fiscal, Debi Primary Care Raquel Racey: Lavera Guise Other Clinician: Referring Emmanuell Kantz: Lavera Guise Treating Analia Zuk/Extender: Melburn Hake, HOYT Weeks in Treatment: 26 Visit Information History Since Last Visit All ordered tests and consults were completed: No Patient Arrived: Wheel Chair Added or deleted any medications: No Arrival Time: 08:09 Any new allergies or adverse reactions: No Accompanied By: self Had a fall or experienced change in No Transfer Assistance: EasyPivot activities of daily living that may affect Patient Lift risk of falls: Patient Identification Verified: Yes Signs or symptoms of abuse/neglect since last No Secondary Verification Process Yes visito Completed: Hospitalized since last visit: No Patient Requires Transmission- No Pain Present Now: No Based Precautions: Patient Has Alerts: Yes Patient Alerts: DM II Electronic Signature(s) Signed: 07/06/2017 7:50:03 AM By: Alric Quan Entered By: Alric Quan on 07/05/2017 08:09:36 [place], [name] ([id number]) -------------------------------------------------------------------------------- Clinic Level of Care Assessment Details Patient Name: [name] Date of Service: 07/05/2017 8:00 AM Medical Record Number: [id number] Patient Account Number: [id number] Date of Birth/Sex: 1961/09/21 (56 y.o. Female) Treating RN: Carolyne Fiscal, Debi Primary Care [name]: Lavera Guise Other Clinician: Referring Audrielle Vankuren: Lavera Guise Treating Diandre Merica/Extender: Melburn Hake, HOYT Weeks in Treatment: 41 Clinic Level of Care Assessment Items TOOL 4 Quantity Score X - Use when only an EandM is performed on FOLLOW-UP visit 1 0 ASSESSMENTS -  Nursing Assessment / Reassessment X - Reassessment of Co-morbidities (includes updates in patient status) 1 10 X - Reassessment of Adherence to Treatment Plan 1 5 ASSESSMENTS - Wound and Skin Assessment / Reassessment X - Simple Wound Assessment / Reassessment - one wound 1 5 []  - Complex Wound Assessment / Reassessment - multiple wounds 0 []  - Dermatologic / Skin Assessment (not related to wound area) 0 ASSESSMENTS - Focused Assessment []  - Circumferential Edema Measurements - multi extremities 0 []  - Nutritional Assessment / Counseling / Intervention 0 []  - Lower Extremity Assessment (monofilament, tuning fork, pulses) 0 []  - Peripheral Arterial Disease Assessment (using hand held doppler) 0 ASSESSMENTS - Ostomy and/or Continence Assessment and Care []  - Incontinence Assessment and Management 0 []  - Ostomy Care Assessment and Management (repouching, etc.) 0 PROCESS - Coordination of Care []  - Simple Patient / Family Education for ongoing care 0 X - Complex (extensive) Patient / Family Education for ongoing care 1 20 X - Staff obtains Programmer, systems, Records, Test Results / Process Orders 1 10 X - Staff telephones HHA, Nursing Homes / Clarify orders / etc 1 10 []  - Routine Transfer to another Facility (non-emergent condition) 0 [place], [name]. ([id number]) []  - Routine Hospital Admission (non-emergent condition) 0 []  - New Admissions / Biomedical engineer / Ordering NPWT, Apligraf, etc. 0 []  - Emergency Hospital Admission (emergent condition) 0 X - Simple Discharge Coordination 1 10 []  - Complex (extensive) Discharge Coordination 0 PROCESS - Special Needs []  - Pediatric / Minor Patient Management 0 []  - Isolation Patient Management 0 []  - Hearing / Language / Visual special needs 0 []  - Assessment of Community assistance (transportation, D/C planning, etc.) 0 []  - Additional assistance / Altered mentation 0 []  - Support Surface(s) Assessment (bed, cushion, seat, etc.) 0 INTERVENTIONS  - Wound Cleansing / Measurement X - Simple Wound Cleansing - one wound 1 5 []  - Complex Wound Cleansing - multiple wounds 0 X -  Wound Imaging (photographs - any number of wounds) 1 5 []  - Wound Tracing (instead of photographs) 0 X - Simple Wound Measurement - one wound 1 5 []  - Complex Wound Measurement - multiple wounds 0 INTERVENTIONS - Wound Dressings []  - Small Wound Dressing one or multiple wounds 0 X - Medium Wound Dressing one or multiple wounds 1 15 []  - Large Wound Dressing one or multiple wounds 0 X - Application of Medications - topical 1 5 []  - Application of Medications - injection 0 INTERVENTIONS - Miscellaneous []  - External ear exam 0 [place], [name]. ([id number]) []  - Specimen Collection (cultures, biopsies, blood, body fluids, etc.) 0 []  - Specimen(s) / Culture(s) sent or taken to Lab for analysis 0 []  - Patient Transfer (multiple staff / [name] / Similar devices) 0 []  - Simple Staple / Suture removal (25 or less) 0 []  - Complex Staple / Suture removal (26 or more) 0 []  - Hypo / Hyperglycemic Management (close monitor of Blood Glucose) 0 []  - Ankle / Brachial Index (ABI) - do not check if billed separately 0 X - Vital Signs 1 5 Has the patient been seen at the hospital within the last three years: Yes Total Score: 110 Level Of Care: New/Established - Level 3 Electronic Signature(s) Signed: 07/06/2017 7:50:03 AM By: Alric Quan Entered By: Alric Quan on 07/05/2017 09:38:46 [place], [name] ([id number]) -------------------------------------------------------------------------------- Encounter Discharge Information Details Patient Name: [name] Date of Service: 07/05/2017 8:00 AM Medical Record Number: [id number] Patient Account Number: [id number] Date of Birth/Sex: [birth year] (56 y.o. Female) Treating RN: Carolyne Fiscal, Debi Primary Care Blanche Scovell: Lavera Guise Other Clinician: Referring Pinchus Weckwerth: Lavera Guise Treating Alireza Pollack/Extender: Melburn Hake,  HOYT Weeks in Treatment: 21 Encounter Discharge Information Items Discharge Pain Level: 0 Discharge Condition: Stable Ambulatory Status: Wheelchair Discharge Destination: Home Transportation: Private Auto change dressing Accompanied By: as ordered Schedule Follow-up Appointment: Yes Medication Reconciliation completed and provided to No Patient/Care Lonzy Mato: Provided on Clinical Summary of Care: 07/05/2017 Form Type Recipient Paper Patient TW Electronic Signature(s) Signed: 07/05/2017 9:09:25 AM By: Ruthine Dose Entered By: Ruthine Dose on 07/05/2017 09:09:25 [place], [name] ([id number]) -------------------------------------------------------------------------------- Lower Extremity Assessment Details Patient Name: [name] Date of Service: 07/05/2017 8:00 AM Medical Record Number: [id number] Patient Account Number: [id number] Date of Birth/Sex: [birth year] (56 y.o. Female) Treating RN: Ahmed Prima Primary Care Forney Kleinpeter: Lavera Guise Other Clinician: Referring Isai Gottlieb: Lavera Guise Treating Shigeko Manard/Extender: Melburn Hake, HOYT Weeks in Treatment: 41 Electronic Signature(s) Signed: 07/06/2017 7:50:03 AM By: Alric Quan Entered By: Alric Quan on 07/05/2017 08:23:04 [place], [place] ([id number]) -------------------------------------------------------------------------------- Multi Wound Chart Details Patient Name: [name] Date of Service: 07/05/2017 8:00 AM Medical Record Number: [id number] Patient Account Number: [id number] Date of Birth/Sex: 1961-04-05 (56 y.o. Female) Treating RN: Carolyne Fiscal, Debi Primary Care Laneisha Mino: Lavera Guise Other Clinician: Referring Lun Muro: Lavera Guise Treating Donyel Castagnola/Extender: Melburn Hake, HOYT Weeks in Treatment: 41 Vital Signs Height(in): 63 Pulse(bpm): 90 Weight(lbs): 257 Blood Pressure 137/74 (mmHg): Body Mass Index(BMI): 46 Temperature(F): 98.3 Respiratory Rate 18 (breaths/min): Photos: [3:No Photos]  [N/A:N/A] Wound Location: [3:Right Gluteal fold] [N/A:N/A] Wounding Event: [3:Pressure Injury] [N/A:N/A] Primary Etiology: [3:Pressure Ulcer] [N/A:N/A] Comorbid History: [3:Asthma, Hypertension, Type II Diabetes, Neuropathy] [N/A:N/A] Date Acquired: [3:04/21/2016] [N/A:N/A] Weeks of Treatment: [3:41] [N/A:N/A] Wound Status: [3:Open] [N/A:N/A] Measurements [name] x W x D 1x1.4x2.9 [N/A:N/A] (cm) Area (cm) : [3:1.1] [N/A:N/A] Volume (cm) : [3:3.189] [N/A:N/A] % Reduction in Area: [3:64.20%] [N/A:N/A] % Reduction in Volume: 81.50% [N/A:N/A] Classification: [3:Category/Stage IV] [N/A:N/A] Exudate Amount: [3:Large] [N/A:N/A]  Exudate Type: [3:Serous] [N/A:N/A] Exudate Color: [3:amber] [N/A:N/A] Foul Odor After [3:Yes] [N/A:N/A] Cleansing: Odor Anticipated Due to No [N/A:N/A] Product Use: Wound Margin: [3:Distinct, outline attached] [N/A:N/A] Granulation Amount: [3:Medium (34-66%)] [N/A:N/A] Granulation Quality: [3:Red] [N/A:N/A] Necrotic Amount: [3:Medium (34-66%)] [N/A:N/A] Exposed Structures: [3:Fat Layer (Subcutaneous Tissue) Exposed: Yes] [N/A:N/A] Epithelialization: None N/A N/A Periwound Skin Texture: Scarring: Yes N/A N/A Excoriation: No Induration: No Callus: No Crepitus: No Rash: No Periwound Skin Maceration: Yes N/A N/A Moisture: Dry/Scaly: No Periwound Skin Color: Atrophie Blanche: No N/A N/A Cyanosis: No Ecchymosis: No Erythema: No Hemosiderin Staining: No Mottled: No Pallor: No Rubor: No Temperature: No Abnormality N/A N/A Tenderness on Yes N/A N/A Palpation: Wound Preparation: Ulcer Cleansing: N/A N/A Rinsed/Irrigated with Saline Topical Anesthetic Applied: Other: lidocaine 4% Treatment Notes Electronic Signature(s) Signed: 07/06/2017 7:50:03 AM By: Alric Quan Entered By: Alric Quan on 07/05/2017 08:23:18 [place], [name] ([id number]) -------------------------------------------------------------------------------- Multi-Disciplinary Care  Plan Details Patient Name: [name] Date of Service: 07/05/2017 8:00 AM Medical Record Number: [id number] Patient Account Number: [id number] Date of Birth/Sex: Aug 02, 1961 (56 y.o. Female) Treating RN: Carolyne Fiscal, Debi Primary Care Savi Lastinger: Lavera Guise Other Clinician: Referring Yulitza Shorts: Lavera Guise Treating Autumne Kallio/Extender: Melburn Hake, HOYT Weeks in Treatment: 41 Active Inactive ` Abuse / Safety / Falls / Self Care Management Nursing Diagnoses: Potential for falls Goals: Patient will remain injury free Date Initiated: 09/21/2016 Target Resolution Date: 03/25/2017 Goal Status: Active Interventions: Assess fall risk on admission and as needed Notes: ` Nutrition Nursing Diagnoses: Imbalanced nutrition Potential for alteratiion in Nutrition/Potential for imbalanced nutrition Goals: Patient/caregiver agrees to and verbalizes understanding of need to use nutritional supplements and/or vitamins as prescribed Date Initiated: 09/21/2016 Target Resolution Date: 03/25/2017 Goal Status: Active Patient/caregiver verbalizes understanding of need to maintain therapeutic glucose control per primary care physician Date Initiated: 09/21/2016 Target Resolution Date: 03/25/2017 Goal Status: Active Interventions: Assess patient nutrition upon admission and as needed per policy Notes: [name], [name] ([id number]) Orientation to the Wound Care Program Nursing Diagnoses: Knowledge deficit related to the wound healing center program Goals: Patient/caregiver will verbalize understanding of the [place] Date Initiated: 09/21/2016 Target Resolution Date: 03/25/2017 Goal Status: Active Interventions: Provide education on orientation to the wound center Notes: ` Pain, Acute or Chronic Nursing Diagnoses: Pain, acute or chronic: actual or potential Potential alteration in comfort, pain Goals: Patient will verbalize adequate pain control and receive pain control  interventions during procedures as needed Date Initiated: 09/21/2016 Target Resolution Date: 03/25/2017 Goal Status: Active Patient/caregiver will verbalize adequate pain control between visits Date Initiated: 09/21/2016 Target Resolution Date: 03/25/2017 Goal Status: Active Patient/caregiver will verbalize comfort level met Date Initiated: 09/21/2016 Target Resolution Date: 03/25/2017 Goal Status: Active Interventions: Assess comfort goal upon admission Complete pain assessment as per visit requirements Notes: ` Wound/Skin Impairment Nursing Diagnoses: Impaired tissue integrity Goals: [place], [name]. ([id number]) Ulcer/skin breakdown will have a volume reduction of 30% by week 4 Date Initiated: 09/21/2016 Target Resolution Date: 03/25/2017 Goal Status: Active Ulcer/skin breakdown will have a volume reduction of 50% by week 8 Date Initiated: 09/21/2016 Target Resolution Date: 03/25/2017 Goal Status: Active Ulcer/skin breakdown will have a volume reduction of 80% by week 12 Date Initiated: 09/21/2016 Target Resolution Date: 03/25/2017 Goal Status: Active Interventions: Assess ulceration(s) every visit Notes: Electronic Signature(s) Signed: 07/06/2017 7:50:03 AM By: Alric Quan Entered By: Alric Quan on 07/05/2017 08:23:10 Sias, [name] ([id number]) -------------------------------------------------------------------------------- Pain Assessment Details Patient Name: [name] Date of Service: 07/05/2017 8:00 AM Medical Record Number: [id number] Patient  Account Number: [id number] Date of Birth/Sex: [birth year] (56 y.o. Female) Treating RN: Carolyne Fiscal, Debi Primary Care Kashish Yglesias: Lavera Guise Other Clinician: Referring Donyae Kohn: Lavera Guise Treating Nuvia Hileman/Extender: Melburn Hake, HOYT Weeks in Treatment: 41 Active Problems Location of Pain Severity and Description of Pain Patient Has Paino No Site Locations With Dressing Change: No Pain Management and  Medication Current Pain Management: Electronic Signature(s) Signed: 07/06/2017 7:50:03 AM By: Alric Quan Entered By: Alric Quan on 07/05/2017 08:09:45 [place], [name] ([id number]) -------------------------------------------------------------------------------- Patient/Caregiver Education Details Patient Name: [name] Date of Service: 07/05/2017 8:00 AM Medical Record Number: [id number] Patient Account Number: [id number] Date of Birth/Gender: [birth year] (56 y.o. Female) Treating RN: Carolyne Fiscal, Debi Primary Care Physician: Lavera Guise Other Clinician: Referring Physician: Lavera Guise Treating Physician/Extender: Sharalyn Ink in Treatment: 89 Education Assessment Education Provided To: Patient Education Topics Provided Wound/Skin Impairment: Handouts: Other: change dressing as ordered Methods: Demonstration, Explain/Verbal Responses: State content correctly Electronic Signature(s) Signed: 07/06/2017 7:50:03 AM By: Alric Quan Entered By: Alric Quan on 07/05/2017 08:24:11 Pietila, [name] ([id number]) -------------------------------------------------------------------------------- Wound Assessment Details Patient Name: [name] Date of Service: 07/05/2017 8:00 AM Medical Record Number: [id number] Patient Account Number: [id number] Date of Birth/Sex: 03-20-1961 (56 y.o. Female) Treating RN: Carolyne Fiscal, Debi Primary Care Landrum Carbonell: Lavera Guise Other Clinician: Referring Damario Gillie: Lavera Guise Treating Rakiya Krawczyk/Extender: Melburn Hake, HOYT Weeks in Treatment: 41 Wound Status Wound Number: 3 Primary Pressure Ulcer Etiology: Wound Location: Right Gluteal fold Wound Status: Open Wounding Event: Pressure Injury Comorbid Asthma, Hypertension, Type II Date Acquired: 04/21/2016 History: Diabetes, Neuropathy Weeks Of Treatment: 41 Clustered Wound: No Photos Photo Uploaded By: Alric Quan on 07/05/2017 09:41:13 Wound Measurements Length: (cm)  1 Width: (cm) 1.4 Depth: (cm) 2.9 Area: (cm) 1.1 Volume: (cm) 3.189 % Reduction in Area: 64.2% % Reduction in Volume: 81.5% Epithelialization: None Tunneling: No Undermining: No Wound Description Classification: Category/Stage IV Foul Odor Aft Wound Margin: Distinct, outline attached Due to Produc Exudate Amount: Large Slough/Fibrin Exudate Type: Serous Exudate Color: amber er Cleansing: Yes t Use: No o Yes Wound Bed Granulation Amount: Medium (34-66%) Exposed Structure Granulation Quality: Red Fat Layer (Subcutaneous Tissue) Exposed: Yes Necrotic Amount: Medium (34-66%) Necrotic Quality: Adherent Slough Krenn, [name]. ([id number]) Periwound Skin Texture Texture Color No Abnormalities Noted: No No Abnormalities Noted: No Callus: No Atrophie Blanche: No Crepitus: No Cyanosis: No Excoriation: No Ecchymosis: No Induration: No Erythema: No Rash: No Hemosiderin Staining: No Scarring: Yes Mottled: No Pallor: No Moisture Rubor: No No Abnormalities Noted: No Dry / Scaly: No Temperature / Pain Maceration: Yes Temperature: No Abnormality Tenderness on Palpation: Yes Wound Preparation Ulcer Cleansing: Rinsed/Irrigated with Saline Topical Anesthetic Applied: Other: lidocaine 4%, Treatment Notes Wound #3 (Right Gluteal fold) 1. Cleansed with: Clean wound with Normal Saline 2. Anesthetic Topical Lidocaine 4% cream to wound bed prior to debridement 3. Peri-wound Care: Skin Prep 4. Dressing Applied: Prisma Ag 8. Negative Pressure Wound Therapy Wound Vac to wound continuously at 173m/hg pressure Black Foam White Foam Number of foam/gauze pieces used in the dressing = Notes 1 PIECE BLACK FOAM TO BRIDGE I piece of white foam Electronic Signature(s) Signed: 07/06/2017 7:50:03 AM By: PAlric QuanEntered By: PAlric Quanon 07/05/2017 08:22:54 Rios, Katelyn LCarlean Jews ([id number] -------------------------------------------------------------------------------- Vitals Details Patient Name: WBenedetto GoadDate of Service: 07/05/2017 8:00 AM Medical Record Number: [id number] Account Number: [id number] of Birth/Sex: [birth year](56y.o. Female) Treating RN: PCarolyne Fiscal Debi Primary Care Harmani Neto: BLavera GuiseOther Clinician: Referring Brylei Pedley: BLavera GuiseTreating Shona Pardo/Extender: SMelburn Hake  HOYT Weeks in Treatment: 41 Vital Signs Time Taken: 08:09 Temperature (F): 98.3 Height (in): 63 Pulse (bpm): 90 Weight (lbs): 257 Respiratory Rate (breaths/min): 18 Body Mass Index (BMI): 45.5 Blood Pressure (mmHg): 137/74 Reference Range: 80 - 120 mg / dl Electronic Signature(s) Signed: 07/06/2017 7:50:03 AM By: Alric Quan Entered By: Alric Quan on 07/05/2017 08:12:01

## 2017-07-12 ENCOUNTER — Ambulatory Visit: Payer: Managed Care, Other (non HMO) | Admitting: Internal Medicine

## 2017-07-19 ENCOUNTER — Encounter: Payer: BLUE CROSS/BLUE SHIELD | Admitting: Internal Medicine

## 2017-07-19 DIAGNOSIS — L89314 Pressure ulcer of right buttock, stage 4: Secondary | ICD-10-CM | POA: Diagnosis not present

## 2017-07-21 NOTE — Progress Notes (Signed)
KARALINA, [name] ([id number]) Visit Report for 07/19/2017 Arrival Information Details Patient Name: [name], [name] Date of Service: 07/19/2017 8:00 AM Medical Record Patient Account Number: [id number] Number: Treating RN: Ahmed Prima [birth year] (56 y.o. Other Clinician: Date of Birth/Sex: Female) Treating ROBSON, MICHAEL Primary Care Scott Vanderveer: Lavera Guise Linell Meldrum/Extender: G Referring Karl Erway: Sallee Lange in Treatment: 37 Visit Information History Since Last Visit All ordered tests and consults were completed: No Patient Arrived: Wheel Chair Added or deleted any medications: No Arrival Time: 08:07 Any new allergies or adverse reactions: No Accompanied By: self Had a fall or experienced change in No Transfer Assistance: EasyPivot activities of daily living that may affect Patient Lift risk of falls: Patient Identification Verified: Yes Signs or symptoms of abuse/neglect since last No Secondary Verification Process Yes visito Completed: Hospitalized since last visit: No Patient Requires Transmission- No Has Dressing in Place as Prescribed: Yes Based Precautions: Pain Present Now: No Patient Has Alerts: Yes Patient Alerts: DM II Electronic Signature(s) Signed: 07/19/2017 5:54:59 PM By: Alric Quan Entered By: Alric Quan on 07/19/2017 08:07:33 Muecke, [name] ([id number]) -------------------------------------------------------------------------------- Encounter Discharge Information Details Patient Name: [name] Date of Service: 07/19/2017 8:00 AM Medical Record Patient Account Number: [id number] Number: Treating RN: Ahmed Prima 02/08/1961 (56 y.o. Other Clinician: Date of Birth/Sex: Female) Treating ROBSON, MICHAEL Primary Care Jermarcus Mcfadyen: Lavera Guise Lizvette Lightsey/Extender: G Referring Joselynne Killam: Sallee Lange in Treatment: 78 Encounter Discharge Information Items Discharge Pain Level: 0 Discharge Condition:  Stable Ambulatory Status: Wheelchair Discharge Destination: Home Transportation: Other Accompanied By: self Schedule Follow-up Appointment: Yes Medication Reconciliation completed and provided to Patient/Care No Kayda Allers: Provided on Clinical Summary of Care: 07/19/2017 Form Type Recipient Paper Patient TW Electronic Signature(s) Signed: 07/19/2017 8:49:53 AM By: Ruthine Dose Entered By: Ruthine Dose on 07/19/2017 08:49:52 [place], [name] ([id number]) -------------------------------------------------------------------------------- Lower Extremity Assessment Details Patient Name: [name] Date of Service: 07/19/2017 8:00 AM Medical Record Patient Account Number: [id number] Number: Treating RN: Ahmed Prima 1961/10/15 (56 y.o. Other Clinician: Date of Birth/Sex: Female) Treating ROBSON, MICHAEL Primary Care Lexington Devine: Lavera Guise Prudence Heiny/Extender: G Referring Tashara Suder: Sallee Lange in Treatment: 49 Electronic Signature(s) Signed: 07/19/2017 5:54:59 PM By: Alric Quan Entered By: Alric Quan on 07/19/2017 08:15:58 Cohenour, Adasia Carlean Jews ([id number]) -------------------------------------------------------------------------------- Multi Wound Chart Details Patient Name: [name] Date of Service: 07/19/2017 8:00 AM Medical Record Patient Account Number: [id number] Number: Treating RN: Ahmed Prima 1961-07-12 (56 y.o. Other Clinician: Date of Birth/Sex: Female) Treating ROBSON, MICHAEL Primary Care Evoleth Nordmeyer: Lavera Guise Kahlani Graber/Extender: G Referring Fredi Geiler: Sallee Lange in Treatment: 43 Vital Signs Height(in): 63 Pulse(bpm): 98 Weight(lbs): 257 Blood Pressure 149/65 (mmHg): Body Mass Index(BMI): 46 Temperature(F): 98.3 Respiratory Rate 18 (breaths/min): Photos: [3:No Photos] [N/A:N/A] Wound Location: [3:Right Gluteal fold] [N/A:N/A] Wounding Event: [3:Pressure Injury] [N/A:N/A] Primary Etiology: [3:Pressure  Ulcer] [N/A:N/A] Comorbid History: [3:Asthma, Hypertension, Type II Diabetes, Neuropathy] [N/A:N/A] Date Acquired: [3:04/21/2016] [N/A:N/A] Weeks of Treatment: [3:43] [N/A:N/A] Wound Status: [3:Open] [N/A:N/A] Measurements L x W x D 0.8x1.3x2.6 [N/A:N/A] (cm) Area (cm) : [3:0.817] [N/A:N/A] Volume (cm) : [3:2.124] [N/A:N/A] % Reduction in Area: [3:73.40%] [N/A:N/A] % Reduction in Volume: 87.60% [N/A:N/A] Classification: [3:Category/Stage IV] [N/A:N/A] Exudate Amount: [3:Large] [N/A:N/A] Exudate Type: [3:Serous] [N/A:N/A] Exudate Color: [3:amber] [N/A:N/A] Foul Odor After [3:Yes] [N/A:N/A] Cleansing: Odor Anticipated Due to No [N/A:N/A] Product Use: Wound Margin: [3:Distinct, outline attached] [N/A:N/A] Granulation Amount: [3:Medium (34-66%)] [N/A:N/A] Granulation Quality: [3:Red] [N/A:N/A] Necrotic Amount: [3:Medium (34-66%)] [N/A:N/A] Exposed Structures: Fat Layer (Subcutaneous N/A N/A Tissue) Exposed: Yes Epithelialization:  None N/A N/A Debridement: Debridement ([id number] N/A N/A [id number]) Pre-procedure 08:34 N/A N/A Verification/Time Out Taken: Pain Control: Lidocaine 4% Topical N/A N/A Solution Tissue Debrided: Fibrin/Slough, Exudates, N/A N/A Subcutaneous Level: Skin/Subcutaneous N/A N/A Tissue Debridement Area (sq 1.04 N/A N/A cm): Instrument: Other(scoop) N/A N/A Bleeding: Minimum N/A N/A Hemostasis Achieved: Pressure N/A N/A Procedural Pain: 0 N/A N/A Post Procedural Pain: 0 N/A N/A Debridement Treatment Procedure was tolerated N/A N/A Response: well Post Debridement 0.8x1.3x2.6 N/A N/A Measurements L x W x D (cm) Post Debridement 2.124 N/A N/A Volume: (cm) Post Debridement Category/Stage IV N/A N/A Stage: Periwound Skin Texture: Scarring: Yes N/A N/A Excoriation: No Induration: No Callus: No Crepitus: No Rash: No Periwound Skin Maceration: Yes N/A N/A Moisture: Dry/Scaly: No Periwound Skin Color: Atrophie Blanche: No N/A N/A Cyanosis:  No Ecchymosis: No Erythema: No Hemosiderin Staining: No Mottled: No Pallor: No Rubor: No Temperature: No Abnormality N/A N/A Tenderness on Yes N/A N/A Palpation: Wound Preparation: N/A N/A Poehler, Feather L. ([id number]) Ulcer Cleansing: Rinsed/Irrigated with Saline Topical Anesthetic Applied: Other: lidocaine 4% Procedures Performed: Debridement N/A N/A Treatment Notes Wound #3 (Right Gluteal fold) 1. Cleansed with: Clean wound with Normal Saline 2. Anesthetic Topical Lidocaine 4% cream to wound bed prior to debridement 3. Peri-wound Care: Skin Prep 4. Dressing Applied: Other dressing (specify in notes) 5. Secondary Dressing Applied Bordered Foam Dressing Dry Gauze Notes Silver[employer]el Rope Electronic Signature(s) Signed: 07/19/2017 6:25:53 PM By: Linton Ham MD Entered By: Linton Ham on 07/19/2017 08:58:42 [place]ourke, [name] ([id number]) -------------------------------------------------------------------------------- Multi-Disciplinary Care Plan Details Patient Name: [name] Date of Service: 07/19/2017 8:00 AM Medical Record Patient Account Number: [id number] Number: Treating RN: Ahmed Prima December 21, 1961 (56 y.o. Other Clinician: Date of Birth/Sex: Female) Treating ROBSON, MICHAEL Primary Care Ario Mcdiarmid: Lavera Guise Kashden Deboy/Extender: G Referring Janell Keeling: Sallee Lange in Treatment: 30 Active Inactive ` Abuse / Safety / Falls / Self Care Management Nursing Diagnoses: Potential for falls Goals: Patient will remain injury free Date Initiated: 09/21/2016 Target Resolution Date: 03/25/2017 Goal Status: Active Interventions: Assess fall risk on admission and as needed Notes: ` Nutrition Nursing Diagnoses: Imbalanced nutrition Potential for alteratiion in Nutrition/Potential for imbalanced nutrition Goals: Patient/caregiver agrees to and verbalizes understanding of need to use nutritional supplements and/or vitamins as prescribed Date  Initiated: 09/21/2016 Target Resolution Date: 03/25/2017 Goal Status: Active Patient/caregiver verbalizes understanding of need to maintain therapeutic glucose control per primary care physician Date Initiated: 09/21/2016 Target Resolution Date: 03/25/2017 Goal Status: Active Interventions: Assess patient nutrition upon admission and as needed per policy Boeding, [place]. ([id number]) Notes: ` Orientation to the Wound Care Program Nursing Diagnoses: Knowledge deficit related to the wound healing center program Goals: Patient/caregiver will verbalize understanding of the [place] Date Initiated: 09/21/2016 Target Resolution Date: 03/25/2017 Goal Status: Active Interventions: Provide education on orientation to the wound center Notes: ` Pain, Acute or Chronic Nursing Diagnoses: Pain, acute or chronic: actual or potential Potential alteration in comfort, pain Goals: Patient will verbalize adequate pain control and receive pain control interventions during procedures as needed Date Initiated: 09/21/2016 Target Resolution Date: 03/25/2017 Goal Status: Active Patient/caregiver will verbalize adequate pain control between visits Date Initiated: 09/21/2016 Target Resolution Date: 03/25/2017 Goal Status: Active Patient/caregiver will verbalize comfort level met Date Initiated: 09/21/2016 Target Resolution Date: 03/25/2017 Goal Status: Active Interventions: Assess comfort goal upon admission Complete pain assessment as per visit requirements Notes: ` Wound/Skin Impairment Nursing Diagnoses: BRITANEE, VANBLARCOM. ([id number]) Impaired tissue integrity Goals: Ulcer/skin breakdown will have a  volume reduction of 30% by week 4 Date Initiated: 09/21/2016 Target Resolution Date: 03/25/2017 Goal Status: Active Ulcer/skin breakdown will have a volume reduction of 50% by week 8 Date Initiated: 09/21/2016 Target Resolution Date: 03/25/2017 Goal Status: Active Ulcer/skin breakdown  will have a volume reduction of 80% by week 12 Date Initiated: 09/21/2016 Target Resolution Date: 03/25/2017 Goal Status: Active Interventions: Assess ulceration(s) every visit Notes: Electronic Signature(s) Signed: 07/19/2017 5:54:59 PM By: Alric Quan Entered By: Alric Quan on 07/19/2017 08:16:07 Faulstich, Lynann Carlean Jews ([id number]) -------------------------------------------------------------------------------- Pain Assessment Details Patient Name: [name] Date of Service: 07/19/2017 8:00 AM Medical Record Patient Account Number: [id number] Number: Treating RN: Ahmed Prima 1961-10-18 (56 y.o. Other Clinician: Date of Birth/Sex: Female) Treating ROBSON, MICHAEL Primary Care Charlotte Fidalgo: Lavera Guise Wisam Siefring/Extender: G Referring Verley Pariseau: Sallee Lange in Treatment: 39 Active Problems Location of Pain Severity and Description of Pain Patient Has Paino No Site Locations With Dressing Change: No Pain Management and Medication Current Pain Management: Electronic Signature(s) Signed: 07/19/2017 5:54:59 PM By: Alric Quan Entered By: Alric Quan on 07/19/2017 08:07:39 Heckert, [name] ([id number]) -------------------------------------------------------------------------------- Patient/Caregiver Education Details Patient Name: [name] Date of Service: 07/19/2017 8:00 AM Medical Record Patient Account Number: [id number] Number: Treating RN: Ahmed Prima [birth year] (56 y.o. Other Clinician: Date of Birth/Gender: Female) Treating ROBSON, MICHAEL Primary Care Physician: Lavera Guise Physician/Extender: G Referring Physician: Sallee Lange in Treatment: 33 Education Assessment Education Provided To: Patient Education Topics Provided Wound/Skin Impairment: Handouts: Other: change dressing as ordered Methods: Demonstration, Explain/Verbal Responses: State content correctly Electronic Signature(s) Signed: 07/19/2017  5:54:59 PM By: Alric Quan Entered By: Alric Quan on 07/19/2017 08:21:23 Medeiros, Brieonna Carlean Jews ([id number]) -------------------------------------------------------------------------------- Wound Assessment Details Patient Name: [name] Date of Service: 07/19/2017 8:00 AM Medical Record Patient Account Number: [id number] Number: Treating RN: Ahmed Prima Jun 07, 1961 (56 y.o. Other Clinician: Date of Birth/Sex: Female) Treating ROBSON, MICHAEL Primary Care Caylei Sperry: Lavera Guise Vince Ainsley/Extender: G Referring Noemie Devivo: Sallee Lange in Treatment: 43 Wound Status Wound Number: 3 Primary Pressure Ulcer Etiology: Wound Location: Right Gluteal fold Wound Status: Open Wounding Event: Pressure Injury Comorbid Asthma, Hypertension, Type II Date Acquired: 04/21/2016 History: Diabetes, Neuropathy Weeks Of Treatment: 43 Clustered Wound: No Photos Photo Uploaded By: Alric Quan on 07/19/2017 13:25:57 Wound Measurements Length: (cm) 0.8 Width: (cm) 1.3 Depth: (cm) 2.6 Area: (cm) 0.817 Volume: (cm) 2.124 % Reduction in Area: 73.4% % Reduction in Volume: 87.6% Epithelialization: None Tunneling: No Undermining: No Wound Description Classification: Category/Stage IV Foul Odor Aft Wound Margin: Distinct, outline attached Due to Produc Exudate Amount: Large Slough/Fibrin Exudate Type: Serous Exudate Color: amber er Cleansing: Yes t Use: No o Yes Wound Bed Granulation Amount: Medium (34-66%) Exposed Structure Granulation Quality: Red Fat Layer (Subcutaneous Tissue) Exposed: Yes Necrotic Amount: Medium (34-66%) Kristiansen, Joey L. ([id number]) Necrotic Quality: Adherent Slough Periwound Skin Texture Texture Color No Abnormalities Noted: No No Abnormalities Noted: No Callus: No Atrophie Blanche: No Crepitus: No Cyanosis: No Excoriation: No Ecchymosis: No Induration: No Erythema: No Rash: No Hemosiderin Staining: No Scarring: Yes Mottled:  No Pallor: No Moisture Rubor: No No Abnormalities Noted: No Dry / Scaly: No Temperature / Pain Maceration: Yes Temperature: No Abnormality Tenderness on Palpation: Yes Wound Preparation Ulcer Cleansing: Rinsed/Irrigated with Saline Topical Anesthetic Applied: Other: lidocaine 4%, Treatment Notes Wound #3 (Right Gluteal fold) 1. Cleansed with: Clean wound with Normal Saline 2. Anesthetic Topical Lidocaine 4% cream to wound bed prior to debridement 3. Peri-wound Care: Skin Prep 4. Dressing Applied: Other  dressing (specify in notes) 5. Secondary Dressing Applied Bordered Foam Dressing Dry Gauze Notes [employer] Rope Electronic Signature(s) Signed: 07/19/2017 5:54:59 PM By: Alric Quan Entered By: Alric Quan on 07/19/2017 08:15:45 Saldivar, [name] ([id number]) -------------------------------------------------------------------------------- [place] Details Patient Name: [name] Date of Service: 07/19/2017 8:00 AM Medical Record Patient Account Number: [id number] Number: Treating RN: Ahmed Prima [birth year] (56 y.o. Other Clinician: Date of Birth/Sex: Female) Treating ROBSON, MICHAEL Primary Care Kwanza Cancelliere: Lavera Guise Jalayna Josten/Extender: G Referring Antionette Luster: Sallee Lange in Treatment: 22 Vital Signs Time Taken: 08:07 Temperature (F): 98.3 Height (in): 63 Pulse (bpm): 98 Weight (lbs): 257 Respiratory Rate (breaths/min): 18 Body Mass Index (BMI): 45.5 Blood Pressure (mmHg): 149/65 Reference Range: 80 - 120 mg / dl Electronic Signature(s) Signed: 07/19/2017 5:54:59 PM By: Alric Quan Entered By: Alric Quan on 07/19/2017 08:08:13

## 2017-07-21 NOTE — Progress Notes (Signed)
JAYE, SAAL ([id number]) Visit Report for 07/19/2017 Debridement Details Patient Name: [name] Date of Service: 07/19/2017 8:00 AM Medical Record Patient Account Number: [id number] Number: Treating RN: Phillis Haggis 01/19/1961 (56 y.o. Other Clinician: Date of Birth/Sex: Female) Treating Posey Petrik Primary Care Provider: Joen [name] Provider/Extender: G Referring Provider: Tedra Senegal in Treatment: 43 Debridement Performed for Wound #3 Right Gluteal fold Assessment: Performed By: Physician Maxwell Caul, MD Debridement: Debridement Pre-procedure Verification/Time Out Yes - 08:34 Taken: Start Time: 08:35 Pain Control: Lidocaine 4% Topical Solution Level: Skin/Subcutaneous Tissue Total Area Debrided (L x 0.8 (cm) x 1.3 (cm) = 1.04 (cm) W): Tissue and other Viable, Non-Viable, Exudate, Fibrin/Slough, Subcutaneous material debrided: Instrument: Other : scoop Bleeding: Minimum Hemostasis Achieved: Pressure End Time: 08:38 Procedural Pain: 0 Post Procedural Pain: 0 Response to Treatment: Procedure was tolerated well Post Debridement Measurements of Total Wound Length: (cm) 0.8 Stage: Category/Stage IV Width: (cm) 1.3 Depth: (cm) 2.6 Volume: (cm) 2.124 Character of Wound/Ulcer Post Requires Further Debridement: Debridement Post Procedure Diagnosis Same as Pre-procedure Electronic Signature(s) KRISANN, MCKENNA ([id number]) Signed: 07/19/2017 5:54:59 PM By: Alejandro Mulling Signed: 07/19/2017 6:25:53 PM By: Baltazar Najjar MD Entered By: Alejandro Mulling on 07/19/2017 08:36:31 Zolman, [name] ([id number]) -------------------------------------------------------------------------------- HPI Details Patient Name: [name] Date of Service: 07/19/2017 8:00 AM Medical Record Patient Account Number: [id number] Number: Treating RN: Phillis Haggis [birth year] (56 y.o. Other Clinician: Date of Birth/Sex: Female) Treating  [name] Primary Care Provider: Joen [name] Provider/Extender: G Referring Provider: Tedra Senegal in Treatment: 36 History of Present Illness Location: bilateral gluteal ulcerations and left popliteal ulceration with tunneling Quality: adits to intermittent aching to ischial ulcers, no discomfort to sinus tract Severity: right iscial ulcer with increased depth Duration: chronic ulcers to bilateral ischial ulcers and sinus tract Timing: The pain is intermittent in severity as far as how intense it becomes but is present all the time. Manipulationn makes this worse. Context: The wound occurred when the patient had a fall and was unconscious for about 48 hours laying on the floor and she had pressure injury at that stage. Modifying Factors: Other treatment(s) tried include:as noted below she has been seen by visiting wound care physicians or nurse practitioners and details have been noted Associated Signs and Symptoms: has yet to receive offloading cushions and mattress overlay HPI Description: 56 year old patient who was seen by visiting [name] wound care specialist for a wound on both her buttock and was found to have an unstageable wound on the right buttock for about 2 months. I understand that she had a fall and was laying on the floor for about 48 hours before she was found and taken to the ICU and had a long injury to her gluteal area from pressure and also had broken her right humerus. She has had a right proximal humerus fracture and has been followed up with orthopedics recently. The patient has a past medical history of type 2 diabetes mellitus, paraparesis, acute pyelonephritis, GERD, hypertension, glaucoma, chronic pain, anxiety neurosis, nicotine dependence, COPD. the patient had some debridement done and was to operative was recommended to use Silvadene dressing and offloading. She is a smoker and occasionally smokes a few cigarettes. the patient requested a second  opinion for months and is here to discuss her care. 09/21/16; the patient re-presents from home today for review of 3 different wounds. I note that she was seen in the clinic here in July at which time she had bilateral buttock  wounds. It was apparently suggested at that time that she use a wound VAC bridged to both wounds just near the initial tuberosity's bilaterally which she refused. The history was a bit difficult to put together. Apparently this patient became ill at the end of April of this year. She was found sitting on the floor she had apparently been for 2 days and subsequently admitted to hospital from 04/23/16 through 05/02/16 and at that point she was critically ill ultimately having sepsis secondary to UTI, nontraumatic rhabdomyolysis and diabetic ketoacidosis. She had acute renal failure and I think required ICU care including intubation. Patient states her wounds actually started at that point on the bilateral issue tuberosities however in reviewing the discharge summary from 5/8 I see no reference to wounds at that point. It did state that she had left lower extremity cellulitis however. Reviewing Epic I see no relevant x-rays. It would appear that her discharge creatinine was within the normal range and indeed on 9/15 her creatinine has remained normal. [facility], LONEY. ([id number]) rehabilitation. There the wounds on her bilateral Buttocks were dressed. Only just before her discharge from the nursing facility she developed an "knot" which was interpreted as cellulitis on the posterior aspect of her left knee she was given antibiotics. Apparently sometime late in July a this actually opened and became a wound at home health care was tending to however she is still having purulent drainage coming from this and by my understanding the wound depth is actually become unmeasurable. I am not really clear about what home health has been  placing in any of these wound areas. The patient states that is something with silver and it. She is not been systemically unwell no fever or chills her appetite is good. She is a diabetic poorly controlled however she states that her recent blood sugars at home have been in the low to mid 100s. 09/28/16 On evaluation today patient appears to continue to exhibit the 3 areas of ulceration that were noted previous. She did have an x-ray of the right pelvis which showed evidence of potential soft tissue infection but no obvious osteomyelitis. There was a discussion last office visit concerning the possibility of a wound VAC. Witth that being said the x-ray report suggested that an MRI may be more appropriate to further evaluate the extent. Subsequently in regard to the wound over the popliteal portion of the left lower extremity with tunneling at 12:00 the CT scan that was ordered was denied by insurance as they state the patient has not had x-rays prior to advanced imaging. Patient states that she is frustrated with the situation overall. 10/05/16 in the interval since I last saw this patient last week she has had the x-ray of the knee performed. I did review that x-ray today and fortunately shows no evidence of osteomyelitis or other acute abnormality at this point in time. She continues to have the opening iin the posterior left popliteal space with tracking proximal up the posterior thigh. Nothing seems to have worsened but it also seems to have not improved. The same is true in regard to the right pressure ulcer over the gluteal region which extends toward the ischium. The left gluteal pressure ulcer actually appears to be doing somewhat better my opinion there is some necrotic slough but overall this appears fairly well. She tells me thatt she has some discomfort especially when home health is helping her with dressing changes as  they do not know her. At worse she rates her pain to be a 5 out  of 10 right now it's more like a 1 out of 10. 10/07/16; still the patient has 3 different wound areas. She has a deep stage IV wound over her right ischial tuberosity. She is due to have an MRI next week. The wound over her left ischial tuberosity is more superficial and underwent debridement today. Finally she has a small open area in her left popliteal fossa the probes on measurably forward superiorly. Still a lot of drainage coming out of this. The CT scan that I ordered 3 weeks ago was questioned by her insurance company wanting a plain x-ray first. As I understand things result of this is nothing has been done in 3 weeks in terms of imaging the thigh and she has an MRI booked of this along with her pelvis for next week line 10/12/16; patient has a deep probing wound over the right ischiall tuberosity, stage III wound over the left visual tuberosity and a draining sinus in her left popliteal fossa. None of this much different from when I saw this 3 weeks ago. We have been using silver rope to the right ischial wound and a draining area in the left popliteal fossa. Plain silver alginate to the area on the left ischial tuberosity 10/19/16; the patient's wounds are essentially unchanged although the area on the left lower gluteal is actually improved. Our intake nurse noted drainage from the right initial tuberosity probing wound as well as the draining area in the left popliteal fossa. Both of these were cultured. She had x-rays I think at the insistence of her insurance company on 09/23/16 x-ray of the pelvis was not particularly helpful she did have soft tissue air over the right lower pelvis although with the depth of this wound this is not surprising. An x-ray of her left knee did not show any specific abnormalities. We are still using silver alginate to these wound areas. Her MRI is booked for 10/27 10/26/16; cultures of the purulent drainage in her right initial tuberosity wound grew  moderate Proteus and few staph aureus. The same organisms were cultured out of the left knee sinus tract posteriorly. The staph aureus is MRSA. I had started her on Augmentin last week I added doxycycline. The MRI of the left lower extremity and pelvis was finally done. The MRI of the femur showed subcutaneous soft tissue swelling Fowle, Castella L. ([id number]) edema fluid and myositis in the vastus lateralis muscle but no soft tissue abscess septic arthritis or osteomyelitis. MRI of the pelvis showed the left wound to be more expensive extending down to the bone there was osteomyelitis. Left hamstring tendons were also involved. No septic arthritis involving the hip. The decubitus ulcer on the right side showed no definite osteomyelitis or abscess.. The right hip wound is actually the one the probes 6 cm downward. But the MRI showing infection including osteomyelitis on the left explains the draining sinus in the popliteal fossa on the left. She did have antibiotics in the hospitalization last time and this extended into her nursing home stay but I'm not exactly sure what antibiotics and for what duration. According the patient this did include vancomycin with considerable effort of our staff we are able to get the patient into see Dr. Sampson Goon today. There were transportation difficulties. Her mother had open heart surgery and is in the ICU in [place] therefore her brother was unable to transport. Dr. Jarrett Ables office  graciously arranged time to see her today. From my point of view she is going to require IV vancomycin plus perhaps a third generation cephalosporin. I plan to keep her on doxycycline and Augmentin until the IV antibiotics can be arranged. 11/02/2016 - Zeva presents today for management of ulcers; She saw Dr. 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 She is to have the PICC line  placed today and receive the initial dose for both antibiotics today. She has yet to receive the offloading chair cushion and/or mattress overlay from home health, apparently this has been a 3 week process. I encouraged her to speak to home health regarding this matter, along with offering home health to contact the wouns care center with any questions or concerns. The left ischial pressure ulcer continues to imporve, while to right ischial ulcer has increased in depth. The popliteal fossa sinus tract remains unmeasurable due to the limitation of depth measurement (tract extends beyond our measuring devices). 11-16-2016 Ms. Hagans presents today for evaluation and management of bilateral ischial stage IV pressure ulcers and sinus tract to the left popliteal fossa. she is under the care of Dr. Sampson Goon for IV antibiotic therapy; she states that the vancomycin was placed on hold and will be restarted at a lower dose based on her renal function. She continues taking Zosyn in addition to the vancomycin. She also states that she has yet to receive offloading cushions from home health, according to her she does not qualify for these offloading cushions because "the ulcers are unstageable ". We will contact the home health agency today to lend clarity regarding her pressure ulcers. The left popliteal fossa sinus tract continues to be a measurable as it extends beyond the length of our measuring devices.. 11/30/16; the patient is still on vancomycin and Zosyn. The depth of the draining sinus behind her left popliteal fossa is down to 4 cm although there is still serosanguineous drainage coming out of this. She saw Dr. Sampson Goon of infectious disease yesterday the idea is to weeks more of IV antibiotics and then oral antibiotics although I have not read his note. The area on the left gluteal fold is just about healed. She has a 6 cm draining sinus over the right initial tuberosity although I cannot feel  bone at the base of this. As far as the patient is aware she has not had a recheck of her inflammatory markers. 12/07/16; patient is on vancomycin and Zosyn appointment with Dr. Sampson Goon on the 19th at which point the patient expects to have a change in antibiotics. Remarkable improvement over the wound over the left ischial tuberosity which is just about closed. The draining sinus in her popliteal fossa has 0.4 cm in depth. The area on the right ischial tuberosity still probes down 7 cm. This is closed and overall wound dimensions but not depth. 12/14/16; patient is completing her vancomycin and Zosyn and per her she is going to transition to Bactrim and Augmentin for another 3 weeks. The area on her left gluteal fold is closed except for some skin tears. The area behind her left knee is no longer has any depth. The only remaining area that is of clinical concern is on the right gluteal fold probing towards the right ischial tuberosity. Today this measures 6.9 cm in depth. Very gritty surface 12/21/16; patient is now on Bactrim and Augmentin as directed by infectious disease. This should be for another  2 weeks. The area in her left gluteal fold and left popliteal are closed over and fully healed. Vanwieren, Magdaline L. ([id number]) Measurements today at 7 cm in the right buttock wound is unchanged from last week. 12/27/16; patient is on Bactrim and Augmentin for another week as directed by infectious disease. She has completed her IV antibiotics. She is not been systemically unwell no fever no chills. The area on her right buttock measured over 6 cm in depth. There is no palpable bone. No evidence of surrounding soft tissue infection. She is complaining of tongue irritation and has a history of thrush 01/04/17; patient is been back to see Dr. Sampson Goon, her Augmentin was stopped but he continued the Bactrim for another 3 weeks. Depth of the wound is 6.7 cm there is been no major change in  either direction. She is not receive the wound VAC from home health I think because of confusion about who is supposed to provided will actually talk to the home health agency today [facility] The net no major change. 01/18/17; patient obtained her wound VAC about 10 days ago however for some reason it was not actually put on the wound. She is therefore here for [place] to apply this I guess. No other issues are noted. She is not complaining of pain fever drainage 02/01/17; patient is here now having the wound VAC or 3-4 weeks to a deep pressure area over the right initial tuberosity. This measures 6 cm in depth today which is about half a centimeter better than 2 weeks ago. There is no evidence she is systemically unwell no fever no chills no pain around the area. 02/15/17; I follow this patient every 2 weeks for a deep area over the right ischial tuberosity. This measures 5.5 cm today which is a continued improvement of 1.2 cm from 1/10 and down 0.5 cm from her visit 2 weeks ago 03/01/17; continued difficult area over the right initial tuberosity using the wound VAC. Depth today of 5.1 cm which is improved. Does not appear to be a lot of drainage in the canister. Antibiotics were finally stopped by Dr. Sampson Goon bactrim[]  and inflammatory markers have been repeated 03/15/17; fall this lady every 2 weeks for a difficult area over her lower right gluteal area/ischial tuberosity. Depth today at 4.6 cm. This is a slow but steady improvement in the depth of this wound. Although we have labeled this as a pressure ulcer there may have been an underlying infection here at one point before we saw her. She had osteomyelitis on the left extensively which is since resolved 03/29/17- patient is here for follow-up evaluation of her right ischial pressure ulcer. She continues with the wound VAC and home health. According to the nurse home health has been using less foams and appropriate and we will instruct accordingly.  The patient continues to smoke, approximately 10 cigarettes a day. She has been advised to decrease that in half by her next appointment, with a goal of complete cessation. She states her blood sugars have been consistently less than 150. She states that she spends most of her day position left lateral or prone. She does have an air mattress on her bed, she does not have an mattress for her chair, she states she cannot afford this. 04/12/17; patient is here for evaluation of her right ischial pressure ulcer. We continue to use a wound VAC with minimal improvement today the depth of this measuring 4.4 cm versus 4.6 cm 2 weeks ago. We are using  a [employer] wound VAC on this area. There is not excessive drainage no pain. The patient tells me she tries to keep off this in bed but is up in the wheelchair for 2 hours a day. She is limited in no her overall ambulation but is improving and apparently is getting bilateral lower extremity braces which she hopes will improve her ability to walk independently. 05/10/17; Depth at 3.3cm. Improved 05/24/17; depth at 3.5 cm. This is not improved since last time. Not clear if they are using collagen under the foam 06/07/17; depth that 2.9 which is a slight improvement. Still using the wound VAC. 06/21/17; depth is 2.9 cm which is exactly the same as last time. Also the appearance of this wound is completely the same. We have been using silver collagen under a wound VAC. 07/05/17 patient presents today for reevaluation concerning her right Ischial wound. She had switched insurance companies and so it does appear that the Wound VAC needs to be reauthorized which we are working on this morning. Nonetheless her wound has been saying about the same we have continued to use the silver collagen underneath the wound VAC. She has no discomfort. 07/19/17; patient follows every 2 weeks for her right if she'll wound. Since I last saw this a month ago her dimensions of come down to 2.6  cm. This is slightly down from a month ago when it was 2.9 cm in 4 Muzquiz, Arelie L. ([id number]) months ago it was 4.6 cm. I note that there were insurance company issues with regard to the wound VAC which she apparently is not having on for several weeks now. I think it would be reasonable to change therapies here. Will use silver alginate rope Electronic Signature(s) Signed: 07/19/2017 6:25:53 PM By: Baltazar Najjar MD Entered By: Baltazar Najjar on 07/19/2017 09:00:58 Frenkel, [name] ([id number]) -------------------------------------------------------------------------------- Physical Exam Details Patient Name: [name] Date of Service: 07/19/2017 8:00 AM Medical Record Patient Account Number: [id number] Number: Treating RN: Phillis Haggis 09-Jul-1961 (56 y.o. Other Clinician: Date of Birth/Sex: Female) Treating Ardyce Heyer Primary Care Provider: Joen [name] Provider/Extender: G Referring Provider: Tedra Senegal in Treatment: 19 Constitutional Patient is hypertensive.. Pulse regular and within target range for patient.Marland Kitchen Respirations regular, non-labored and within target range.. Temperature is normal and within the target range for the patient.Marland Kitchen appears in no distress. Notes Wound exam; the base of the wound cannot be visualized measures 2.6 cm. As usual the base and sides of the wound have a necrotic surface which I debride with an open curet. There is no evidence of infection. Electronic Signature(s) Signed: 07/19/2017 6:25:53 PM By: Baltazar Najjar MD Entered By: Baltazar Najjar on 07/19/2017 09:02:07 Bunton, [name] ([id number]) -------------------------------------------------------------------------------- Physician Orders Details Patient Name: [name] Date of Service: 07/19/2017 8:00 AM Medical Record Patient Account Number: [id number] Number: Treating RN: Phillis Haggis Sep 28, 1961 (56 y.o. Other Clinician: Date of Birth/Sex: Female)  Treating Maurita Havener Primary Care Provider: Joen [name] Provider/Extender: G Referring Provider: Tedra Senegal in Treatment: 15 Verbal / Phone Orders: Yes Clinician: Ashok Cordia, Debi Read Back and Verified: Yes Diagnosis Coding Wound Cleansing Wound #3 Right Gluteal fold o Clean wound with Normal Saline. o Cleanse wound with mild soap and water Skin Barriers/Peri-Wound Care Wound #3 Right Gluteal fold o Skin Prep Primary Wound Dressing Wound #3 Right Gluteal fold o Other: - SilverCel non-adherent rope lightly pack in wound Secondary Dressing Wound #3 Right Gluteal fold o Dry Gauze o Boardered Foam Dressing Dressing  Change Frequency Wound #3 Right Gluteal fold o Three times weekly Follow-up Appointments Wound #3 Right Gluteal fold o Return Appointment in 2 weeks. Off-Loading Wound #3 Right Gluteal fold o Turn and reposition every 2 hours Additional Orders / Instructions Wound #3 Right Gluteal fold Schiltz, Celester L. ([id number]) o Increase protein intake. Home Health Wound #3 Right Gluteal fold o Continue Home Health Visits - [facility] Nurse may visit PRN to address patientos wound care needs. o FACE TO FACE ENCOUNTER: MEDICARE and MEDICAID PATIENTS: I certify that this patient is under my care and that I had a face-to-face encounter that meets the physician face-to-face encounter requirements with this patient on this date. The encounter with the patient was in whole or in part for the following MEDICAL CONDITION: (primary reason for Home Healthcare) MEDICAL NECESSITY: I certify, that based on my findings, NURSING services are a medically necessary home health service. HOME BOUND STATUS: I certify that my clinical findings support that this patient is homebound (i.e., Due to illness or injury, pt requires aid of supportive devices such as crutches, cane, wheelchairs, walkers, the use of special transportation or the  assistance of another person to leave their place of residence. There is a normal inability to leave the home and doing so requires considerable and taxing effort. Other absences are for medical reasons / religious services and are infrequent or of short duration when for other reasons). o If current dressing causes regression in wound condition, may D/C ordered dressing product/s and apply Normal Saline Moist Dressing daily until next Wound Healing Center / Other MD appointment. Notify Wound Healing Center of regression in wound condition at [phone]. o Please direct any NON-WOUND related issues/requests for orders to patient's Primary Care Physician Electronic Signature(s) Signed: 07/19/2017 5:54:59 PM By: Alejandro Mulling Signed: 07/19/2017 6:25:53 PM By: Baltazar Najjar MD Entered By: Alejandro Mulling on 07/19/2017 08:39:08 Poulson, [name] ([id number]) -------------------------------------------------------------------------------- Problem List Details Patient Name: [name] Date of Service: 07/19/2017 8:00 AM Medical Record Patient Account Number: [id number] Number: Treating RN: Phillis Haggis Oct 19, 1961 (56 y.o. 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 09/21/2016 09/21/2016 severity Resolved Problems ICD-10 Code Description Active Date Resolved Date L89.323 Pressure ulcer of left buttock, stage 3 09/21/2016  09/21/2016 L89.324 Pressure ulcer of left buttock, stage 4 11/16/2016 11/16/2016 Electronic Signature(s) Signed: 07/19/2017 6:25:53 PM By: Baltazar Najjar MD Lordi, [name] ([id number]) Entered By: Baltazar Najjar on 07/19/2017 08:58:28 [name], [name] ([id number]) -------------------------------------------------------------------------------- Progress Note Details Patient Name: [name] Date of Service: 07/19/2017 8:00 AM Medical Record Patient Account Number: [id number] Number: Treating RN: Phillis Haggis [birth year] (56 y.o. Other Clinician: Date of Birth/Sex: Female) Treating Sandy Blouch Primary Care Provider: Joen [name] Provider/Extender: G Referring Provider: Tedra Senegal in Treatment: 43 Subjective History of Present Illness (HPI) The following HPI elements were documented for the patient's wound: Location: bilateral gluteal ulcerations and left popliteal ulceration with tunneling Quality: adits to intermittent aching to ischial ulcers, no discomfort to sinus tract Severity: right iscial ulcer with increased depth Duration: chronic  ulcers to bilateral ischial ulcers and sinus tract Timing: The pain is intermittent in severity as far as how intense it becomes but is present all the time. Manipulationn makes this worse. Context: The wound occurred when the patient had a fall and was unconscious for about 48 hours laying on the floor and she had pressure injury at that stage. Modifying Factors: Other treatment(s) tried include:as noted below she has been seen by visiting wound care physicians or nurse practitioners and details have been noted Associated Signs and Symptoms: has yet to receive offloading cushions and mattress overlay 56 year old patient who was seen by visiting [name] wound care specialist for a wound on both her buttock and was found to have an unstageable wound on the right buttock for about 2 months. I understand that she had a fall and  was laying on the floor for about 48 hours before she was found and taken to the ICU and had a long injury to her gluteal area from pressure and also had broken her right humerus. She has had a right proximal humerus fracture and has been followed up with orthopedics recently. The patient has a past medical history of type 2 diabetes mellitus, paraparesis, acute pyelonephritis, GERD, hypertension, glaucoma, chronic pain, anxiety neurosis, nicotine dependence, COPD. the patient had some debridement done and was to operative was recommended to use Silvadene dressing and offloading. She is a smoker and occasionally smokes a few cigarettes. the patient requested a second opinion for months and is here to discuss her care. 09/21/16; the patient re-presents from home today for review of 3 different wounds. I note that she was seen in the clinic here in July at which time she had bilateral buttock wounds. It was apparently suggested at that time that she use a wound VAC bridged to both wounds just near the initial tuberosity's bilaterally which she refused. The history was a bit difficult to put together. Apparently this patient became ill at the end of April of this year. She was found sitting on the floor she had apparently been for 2 days and subsequently admitted to hospital from 04/23/16 through 05/02/16 and at that point she was critically ill ultimately having sepsis secondary to UTI, nontraumatic rhabdomyolysis and diabetic ketoacidosis. She had acute renal failure and I think required ICU care including intubation. Patient states her wounds actually started at that point on the bilateral issue tuberosities however in reviewing the discharge summary from 5/8 I see no reference to Dematteo, Makenna L. ([id number]) wounds at that point. It did state that she had left lower extremity cellulitis however. Reviewing Epic I see no relevant x-rays. It would appear that her discharge creatinine was within the  normal range and indeed on 9/15 her creatinine has remained normal. [facility] There the wounds on her bilateral Buttocks were dressed. Only just before her discharge from the nursing facility she developed an "knot" which was interpreted as cellulitis on the posterior aspect of her left knee she was given antibiotics. Apparently sometime late in July a this actually opened and became a wound at home health care was tending to however she is still having purulent drainage coming from this and by my understanding the wound depth is actually become unmeasurable. I am not really clear about what home health has been placing in any of these wound areas. The patient states that is something with silver and it. She is not been systemically unwell no fever or  chills her appetite is good. She is a diabetic poorly controlled however she states that her recent blood sugars at home have been in the low to mid 100s. 09/28/16 On evaluation today patient appears to continue to exhibit the 3 areas of ulceration that were noted previous. She did have an x-ray of the right pelvis which showed evidence of potential soft tissue infection but no obvious osteomyelitis. There was a discussion last office visit concerning the possibility of a wound VAC. Witth that being said the x-ray report suggested that an MRI may be more appropriate to further evaluate the extent. Subsequently in regard to the wound over the popliteal portion of the left lower extremity with tunneling at 12:00 the CT scan that was ordered was denied by insurance as they state the patient has not had x-rays prior to advanced imaging. Patient states that she is frustrated with the situation overall. 10/05/16 in the interval since I last saw this patient last week she has had the x-ray of the knee performed. I did review that x-ray today and fortunately shows no evidence of osteomyelitis or  other acute abnormality at this point in time. She continues to have the opening iin the posterior left popliteal space with tracking proximal up the posterior thigh. Nothing seems to have worsened but it also seems to have not improved. The same is true in regard to the right pressure ulcer over the gluteal region which extends toward the ischium. The left gluteal pressure ulcer actually appears to be doing somewhat better my opinion there is some necrotic slough but overall this appears fairly well. She tells me thatt she has some discomfort especially when home health is helping her with dressing changes as they do not know her. At worse she rates her pain to be a 5 out of 10 right now it's more like a 1 out of 10. 10/07/16; still the patient has 3 different wound areas. She has a deep stage IV wound over her right ischial tuberosity. She is due to have an MRI next week. The wound over her left ischial tuberosity is more superficial and underwent debridement today. Finally she has a small open area in her left popliteal fossa the probes on measurably forward superiorly. Still a lot of drainage coming out of this. The CT scan that I ordered 3 weeks ago was questioned by her insurance company wanting a plain x-ray first. As I understand things result of this is nothing has been done in 3 weeks in terms of imaging the thigh and she has an MRI booked of this along with her pelvis for next week line 10/12/16; patient has a deep probing wound over the right ischiall tuberosity, stage III wound over the left visual tuberosity and a draining sinus in her left popliteal fossa. None of this much different from when I saw this 3 weeks ago. We have been using silver rope to the right ischial wound and a draining area in the left popliteal fossa. Plain silver alginate to the area on the left ischial tuberosity 10/19/16; the patient's wounds are essentially unchanged although the area on the left lower  gluteal is actually improved. Our intake nurse noted drainage from the right initial tuberosity probing wound as well as the draining area in the left popliteal fossa. Both of these were cultured. She had x-rays I think at the insistence of her insurance company on 09/23/16 x-ray of the pelvis was not particularly helpful she did have soft tissue air over  the right lower pelvis although with the depth of this wound this is not surprising. An x-ray of her left knee did not show any specific abnormalities. We are still using silver alginate to these wound areas. Her MRI is booked for 10/27 10/26/16; cultures of the purulent drainage in her right initial tuberosity wound grew moderate Proteus and Hofferber, Ellese L. ([id number]) few staph aureus. The same organisms were cultured out of the left knee sinus tract posteriorly. The staph aureus is MRSA. I had started her on Augmentin last week I added doxycycline. The MRI of the left lower extremity and pelvis was finally done. The MRI of the femur showed subcutaneous soft tissue swelling edema fluid and myositis in the vastus lateralis muscle but no soft tissue abscess septic arthritis or osteomyelitis. MRI of the pelvis showed the left wound to be more expensive extending down to the bone there was osteomyelitis. Left hamstring tendons were also involved. No septic arthritis involving the hip. The decubitus ulcer on the right side showed no definite osteomyelitis or abscess.. The right hip wound is actually the one the probes 6 cm downward. But the MRI showing infection including osteomyelitis on the left explains the draining sinus in the popliteal fossa on the left. She did have antibiotics in the hospitalization last time and this extended into her nursing home stay but I'm not exactly sure what antibiotics and for what duration. According the patient this did include vancomycin with considerable effort of our staff we are able to get the patient into see  Dr. Sampson Goon today. There were transportation difficulties. Her mother had open heart surgery and is in the ICU in [place] therefore her brother was unable to transport. Dr. Jarrett Ables office graciously arranged time to see her today. From my point of view she is going to require IV vancomycin plus perhaps a third generation cephalosporin. I plan to keep her on doxycycline and Augmentin until the IV antibiotics can be arranged. 11/02/2016 - Meggie presents today for management of ulcers; She saw Dr. 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 She is to have the PICC line placed today and receive the initial dose for both antibiotics today. She has yet to receive the offloading chair cushion and/or mattress overlay from home health, apparently this has been a 3 week process. I encouraged her to speak to home health regarding this matter, along with offering home health to contact the wouns care center with any questions or concerns. The left ischial pressure ulcer continues to imporve, while to right ischial ulcer has increased in depth. The popliteal fossa sinus tract remains unmeasurable due to the limitation of depth measurement (tract extends beyond our measuring devices). 11-16-2016 Ms. Pickerill presents today for evaluation and management of bilateral ischial stage IV pressure ulcers and sinus tract to the left popliteal fossa. she is under the care of Dr. Sampson Goon for IV antibiotic therapy; she states that the vancomycin was placed on hold and will be restarted at a lower dose based on her renal function. She continues taking Zosyn in addition to the vancomycin. She also states that she has yet to receive offloading cushions from home health, according to her she does not qualify for these offloading cushions because "the ulcers are unstageable ". We will contact the home health agency today  to lend clarity regarding her pressure ulcers. The left popliteal fossa sinus tract continues to be a measurable as it extends  beyond the length of our measuring devices.. 11/30/16; the patient is still on vancomycin and Zosyn. The depth of the draining sinus behind her left popliteal fossa is down to 4 cm although there is still serosanguineous drainage coming out of this. She saw Dr. Sampson Goon of infectious disease yesterday the idea is to weeks more of IV antibiotics and then oral antibiotics although I have not read his note. The area on the left gluteal fold is just about healed. She has a 6 cm draining sinus over the right initial tuberosity although I cannot feel bone at the base of this. As far as the patient is aware she has not had a recheck of her inflammatory markers. 12/07/16; patient is on vancomycin and Zosyn appointment with Dr. Sampson Goon on the 19th at which point the patient expects to have a change in antibiotics. Remarkable improvement over the wound over the left ischial tuberosity which is just about closed. The draining sinus in her popliteal fossa has 0.4 cm in depth. The area on the right ischial tuberosity still probes down 7 cm. This is closed and overall wound dimensions but not depth. 12/14/16; patient is completing her vancomycin and Zosyn and per her she is going to transition to Bactrim and Augmentin for another 3 weeks. The area on her left gluteal fold is closed except for some skin tears. The area behind her left knee is no longer has any depth. The only remaining area that is of clinical concern is on the right gluteal fold probing towards the right ischial tuberosity. Today this measures 6.9 cm in depth. Bellomo, Lawanda L. ([id number]) Very gritty surface 12/21/16; patient is now on Bactrim and Augmentin as directed by infectious disease. This should be for another 2 weeks. The area in her left gluteal fold and left popliteal are closed over and fully  healed. Measurements today at 7 cm in the right buttock wound is unchanged from last week. 12/27/16; patient is on Bactrim and Augmentin for another week as directed by infectious disease. She has completed her IV antibiotics. She is not been systemically unwell no fever no chills. The area on her right buttock measured over 6 cm in depth. There is no palpable bone. No evidence of surrounding soft tissue infection. She is complaining of tongue irritation and has a history of thrush 01/04/17; patient is been back to see Dr. Sampson Goon, her Augmentin was stopped but he continued the Bactrim for another 3 weeks. Depth of the wound is 6.7 cm there is been no major change in either direction. She is not receive the wound VAC from home health I think because of confusion about who is supposed to provided will actually talk to the home health agency today [[facility] The net no major change. 01/18/17; patient obtained her wound VAC about 10 days ago however for some reason it was not actually put on the wound. She is therefore here for [place] to apply this I guess. No other issues are noted. She is not complaining of pain fever drainage 02/01/17; patient is here now having the wound VAC or 3-4 weeks to a deep pressure area over the right initial tuberosity. This measures 6 cm in depth today which is about half a centimeter better than 2 weeks ago. There is no evidence she is systemically unwell no fever no chills no pain around the area. 02/15/17; I follow this patient every 2 weeks for a deep area over the right ischial tuberosity. This measures 5.5 cm today which is  a continued improvement of 1.2 cm from 1/10 and down 0.5 cm from her visit 2 weeks ago 03/01/17; continued difficult area over the right initial tuberosity using the wound VAC. Depth today of 5.1 cm which is improved. Does not appear to be a lot of drainage in the canister. Antibiotics were finally stopped by Dr. Sampson Goon bactrim[]  and inflammatory  markers have been repeated 03/15/17; fall this lady every 2 weeks for a difficult area over her lower right gluteal area/ischial tuberosity. Depth today at 4.6 cm. This is a slow but steady improvement in the depth of this wound. Although we have labeled this as a pressure ulcer there may have been an underlying infection here at one point before we saw her. She had osteomyelitis on the left extensively which is since resolved 03/29/17- patient is here for follow-up evaluation of her right ischial pressure ulcer. She continues with the wound VAC and home health. According to the nurse home health has been using less foams and appropriate and we will instruct accordingly. The patient continues to smoke, approximately 10 cigarettes a day. She has been advised to decrease that in half by her next appointment, with a goal of complete cessation. She states her blood sugars have been consistently less than 150. She states that she spends most of her day position left lateral or prone. She does have an air mattress on her bed, she does not have an mattress for her chair, she states she cannot afford this. 04/12/17; patient is here for evaluation of her right ischial pressure ulcer. We continue to use a wound VAC with minimal improvement today the depth of this measuring 4.4 cm versus 4.6 cm 2 weeks ago. We are using a [employer] wound VAC on this area. There is not excessive drainage no pain. The patient tells me she tries to keep off this in bed but is up in the wheelchair for 2 hours a day. She is limited in no her overall ambulation but is improving and apparently is getting bilateral lower extremity braces which she hopes will improve her ability to walk independently. 05/10/17; Depth at 3.3cm. Improved 05/24/17; depth at 3.5 cm. This is not improved since last time. Not clear if they are using collagen under the foam 06/07/17; depth that 2.9 which is a slight improvement. Still using the wound VAC. 06/21/17;  depth is 2.9 cm which is exactly the same as last time. Also the appearance of this wound is completely the same. We have been using silver collagen under a wound VAC. 07/05/17 patient presents today for reevaluation concerning her right Ischial wound. She had switched insurance companies and so it does appear that the Wound VAC needs to be reauthorized which we are working on this morning. Nonetheless her wound has been saying about the same we have continued to Stefano, Anaia L. ([id number]) use the silver collagen underneath the wound VAC. She has no discomfort. 07/19/17; patient follows every 2 weeks for her right if she'll wound. Since I last saw this a month ago her dimensions of come down to 2.6 cm. This is slightly down from a month ago when it was 2.9 cm in 4 months ago it was 4.6 cm. I note that there were insurance company issues with regard to the wound VAC which she apparently is not having on for several weeks now. I think it would be reasonable to change therapies here. Will use silver alginate rope Objective Constitutional Patient is hypertensive.. Pulse regular and within target range  for patient.Marland Kitchen Respirations regular, non-labored and within target range.. Temperature is normal and within the target range for the patient.Marland Kitchen appears in no distress. Vitals Time Taken: 8:07 AM, Height: 63 in, Weight: 257 lbs, BMI: 45.5, Temperature: 98.3 F, Pulse: 98 bpm, Respiratory Rate: 18 breaths/min, Blood Pressure: 149/65 mmHg. General Notes: Wound exam; the base of the wound cannot be visualized measures 2.6 cm. As usual the base and sides of the wound have a necrotic surface which I debride with an open curet. There is no evidence of infection. Integumentary (Hair, Skin) Wound #3 status is Open. Original cause of wound was Pressure Injury. The wound is located on the Right Gluteal fold. The wound measures 0.8cm length x 1.3cm width x 2.6cm depth; 0.817cm^2 area and 2.124cm^3 volume. There  is Fat Layer (Subcutaneous Tissue) Exposed exposed. There is no tunneling or undermining noted. There is a large amount of serous drainage noted. The wound margin is distinct with the outline attached to the wound base. There is medium (34-66%) red granulation within the wound bed. There is a medium (34-66%) amount of necrotic tissue within the wound bed including Adherent Slough. The periwound skin appearance exhibited: Scarring, Maceration. The periwound skin appearance did not exhibit: Callus, Crepitus, Excoriation, Induration, Rash, Dry/Scaly, Atrophie Blanche, Cyanosis, Ecchymosis, Hemosiderin Staining, Mottled, Pallor, Rubor, Erythema. Periwound temperature was noted as No Abnormality. The periwound has tenderness on palpation. Assessment Active Problems Defelice, Brenleigh L. ([id number]) ICD-10 L89.314 - Pressure ulcer of right buttock, stage 4 E11.42 - Type 2 diabetes mellitus with diabetic polyneuropathy Procedures Wound #3 Pre-procedure diagnosis of Wound #3 is a Pressure Ulcer located on the Right Gluteal fold . There was a Skin/Subcutaneous Tissue Debridement ([id number]) debridement with total area of 1.04 sq cm performed by Maxwell Caul, MD. with the following instrument(s): scoop to remove Viable and Non-Viable tissue/material including Exudate, Fibrin/Slough, and Subcutaneous after achieving pain control using Lidocaine 4% Topical Solution. A time out was conducted at 08:34, prior to the start of the procedure. A Minimum amount of bleeding was controlled with Pressure. The procedure was tolerated well with a pain level of 0 throughout and a pain level of 0 following the procedure. Post Debridement Measurements: 0.8cm length x 1.3cm width x 2.6cm depth; 2.124cm^3 volume. Post debridement Stage noted as Category/Stage IV. Character of Wound/Ulcer Post Debridement requires further debridement. Post procedure Diagnosis Wound #3: Same as Pre-Procedure Plan Wound Cleansing: Wound  #3 Right Gluteal fold: Clean wound with Normal Saline. Cleanse wound with mild soap and water Skin Barriers/Peri-Wound Care: Wound #3 Right Gluteal fold: Skin Prep Primary Wound Dressing: Wound #3 Right Gluteal fold: Other: - SilverCel non-adherent rope lightly pack in wound Secondary Dressing: Wound #3 Right Gluteal fold: Dry Gauze Boardered Foam Dressing Dressing Change Frequency: Wound #3 Right Gluteal fold: Three times weekly Follow-up Appointments: Wound #3 Right Gluteal fold: Caspers, Shevette L. ([id number]) Return Appointment in 2 weeks. Off-Loading: Wound #3 Right Gluteal fold: Turn and reposition every 2 hours Additional Orders / Instructions: Wound #3 Right Gluteal fold: Increase protein intake. Home Health: Wound #3 Right Gluteal fold: Continue Home Health Visits - [facility] Nurse may visit PRN to address patient s wound care needs. FACE TO FACE ENCOUNTER: MEDICARE and MEDICAID PATIENTS: I certify that this patient is under my care and that I had a face-to-face encounter that meets the physician face-to-face encounter requirements with this patient on this date. The encounter with the patient was in whole or in part for the following  MEDICAL CONDITION: (primary reason for Home Healthcare) MEDICAL NECESSITY: I certify, that based on my findings, NURSING services are a medically necessary home health service. HOME BOUND STATUS: I certify that my clinical findings support that this patient is homebound (i.e., Due to illness or injury, pt requires aid of supportive devices such as crutches, cane, wheelchairs, walkers, the use of special transportation or the assistance of another person to leave their place of residence. There is a normal inability to leave the home and doing so requires considerable and taxing effort. Other absences are for medical reasons / religious services and are infrequent or of short duration when for other reasons). If current  dressing causes regression in wound condition, may D/C ordered dressing product/s and apply Normal Saline Moist Dressing daily until next W[facility]aling Center / Other MD appointment. Notify Wound Healing Center of regression in wound condition at [phone]. Please direct any NON-WOUND related issues/requests for orders to patient's Primary Care Physician #1 I changed to silver alginate rope packing. This is changed by home health. I think at this point we have had as much gain out of the wound VAC is we are going to get. If we can get the wound in a little more than I might consider silver collagen/hydrogel Electronic Signature(s) Signed: 07/19/2017 6:25:53 PM By: Baltazar Najjar MD Entered By: Baltazar Najjar on 07/19/2017 09:03:47 Justice, [name] ([id number]) -------------------------------------------------------------------------------- SuperBill Details Patient Name: [name] Date of Service: 07/19/2017 Medical Record Patient Account Number: [id number] Number: Treating RN: Phillis Haggis 09/25/1961 (56 y.o. 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

## 2017-07-24 ENCOUNTER — Telehealth: Payer: Self-pay | Admitting: *Deleted

## 2017-07-24 NOTE — Telephone Encounter (Signed)
Patient called to discuss pre procedure instructions and how to take her medication the morning of.  Instructions given for BP medicine and remainin NPO prior to procedure.  Also, reminded patient that all instructions are printed off in the AVS and those were also reviewed with her.  Patient states she does not remember if these things were reviewed with her at last D/C as she has a problem with short term/long term memory loss.  Patient verbalizes u/o information.

## 2017-07-25 ENCOUNTER — Encounter: Payer: Self-pay | Admitting: Pain Medicine

## 2017-07-25 ENCOUNTER — Ambulatory Visit (HOSPITAL_BASED_OUTPATIENT_CLINIC_OR_DEPARTMENT_OTHER): Payer: BLUE CROSS/BLUE SHIELD | Admitting: Pain Medicine

## 2017-07-25 ENCOUNTER — Ambulatory Visit
Admission: RE | Admit: 2017-07-25 | Discharge: 2017-07-25 | Disposition: A | Discharge status: Home / Self Care | Payer: BLUE CROSS/BLUE SHIELD | Source: Ambulatory Visit | Attending: Pain Medicine | Admitting: Pain Medicine

## 2017-07-25 VITALS — BP 139/61 | HR 75 | Temp 96.4°F | Resp 16 | Ht 63.0 in | Wt 273.0 lb

## 2017-07-25 DIAGNOSIS — E1142 Type 2 diabetes mellitus with diabetic polyneuropathy: Secondary | ICD-10-CM

## 2017-07-25 DIAGNOSIS — G8220 Paraplegia, unspecified: Secondary | ICD-10-CM | POA: Diagnosis not present

## 2017-07-25 DIAGNOSIS — R29898 Other symptoms and signs involving the musculoskeletal system: Secondary | ICD-10-CM | POA: Diagnosis not present

## 2017-07-25 DIAGNOSIS — R600 Localized edema: Secondary | ICD-10-CM | POA: Insufficient documentation

## 2017-07-25 DIAGNOSIS — G8929 Other chronic pain: Secondary | ICD-10-CM

## 2017-07-25 DIAGNOSIS — M79674 Pain in right toe(s): Secondary | ICD-10-CM

## 2017-07-25 DIAGNOSIS — M79675 Pain in left toe(s): Secondary | ICD-10-CM

## 2017-07-25 MED ORDER — BUPIVACAINE-EPINEPHRINE (PF) 0.25% -1:200000 IJ SOLN
INTRAMUSCULAR | Status: AC
  Filled 2017-07-25: qty 30

## 2017-07-25 MED ORDER — FENTANYL CITRATE (PF) 100 MCG/2ML IJ SOLN
25.0000 ug | INTRAMUSCULAR | Status: DC | PRN
  Administered 2017-07-25: 50 ug via INTRAVENOUS
  Filled 2017-07-25: qty 2

## 2017-07-25 MED ORDER — MIDAZOLAM HCL 5 MG/5ML IJ SOLN
1.0000 mg | INTRAMUSCULAR | Status: DC | PRN
  Administered 2017-07-25: 2 mg via INTRAVENOUS
  Filled 2017-07-25: qty 5

## 2017-07-25 MED ORDER — LIDOCAINE HCL (PF) 1.5 % IJ SOLN
20.0000 mL | Freq: Once | INTRAMUSCULAR | Status: AC
  Administered 2017-07-25: 20 mL
  Filled 2017-07-25: qty 20

## 2017-07-25 MED ORDER — LACTATED RINGERS IV SOLN
1000.0000 mL | Freq: Once | INTRAVENOUS | Status: AC
  Administered 2017-07-25: 1000 mL via INTRAVENOUS

## 2017-07-25 MED ORDER — IOPAMIDOL (ISOVUE-M 200) INJECTION 41%
INTRAMUSCULAR | Status: AC
  Filled 2017-07-25: qty 10

## 2017-07-25 MED ORDER — BUPIVACAINE-EPINEPHRINE 0.25% -1:200000 IJ SOLN
10.0000 mL | Freq: Once | INTRAMUSCULAR | Status: AC
  Administered 2017-07-25: 10 mL
  Filled 2017-07-25: qty 10

## 2017-07-25 MED ORDER — LIDOCAINE HCL (PF) 1 % IJ SOLN
INTRAMUSCULAR | Status: AC
  Filled 2017-07-25: qty 5

## 2017-07-25 NOTE — Progress Notes (Signed)
Patient's Name: [name]  MRN: [id number]  Referring Provider: Dortha Kern, MD  DOB: [birth year]  PCP: Dortha Kern, MD  DOS: 07/25/2017  Note by: Oswaldo Done, MD  Service setting: Ambulatory outpatient  Specialty: Interventional Pain Management  Patient type: Established  Location: [facility] (AMB) Pain Management Facility  Visit type: Interventional Procedure   Primary Reason for Visit: Interventional Pain Management Treatment. CC: Foot Pain (bilateral) and Hand Pain (bilateral)  Procedure:  Anesthesia, Analgesia, Anxiolysis:  Type: Diagnostic Lumbar Sympathetic Block Region:Lumbosacral Level: L4 Laterality: Left-Sided Paravertebral  Type: Local Anesthesia with Moderate (Conscious) Sedation Local Anesthetic: Lidocaine 1% Route: Intravenous (IV) IV Access: Secured Sedation: Meaningful verbal contact was maintained at all times during the procedure  Indication(s): Analgesia and Anxiety  Indications: 1. Chronic feet pain (Location of Primary Source of Pain) (Bilateral) (R>L)   2. Diabetic peripheral neuropathy (HCC) (Location of Primary Source of Pain) (Bilateral) (R>L)   3. Edema leg   4. Paraparesis (HCC)   5. Bilateral edema of lower extremity   6. Bilateral leg weakness    Pain Score: Pre-procedure: 7 /10 Post-procedure: 0-No pain/10  Pre-op Assessment:  Previous date of service: 07/04/17 Service provided: Evaluation [name] is a 56 y.o. (year old), female patient, seen today for interventional treatment. She  has a past surgical history that includes Anterior cruciate ligament repair. [name] has a current medication list which includes the following prescription(s): vitamin c, atorvastatin, calcium carbonate-vitamin d3, cholecalciferol, doxycycline, duloxetine, furosemide, gabapentin, insulin aspart, insulin detemir, latanoprost, metoprolol tartrate, oxybutynin, oxycodone-acetaminophen, pantoprazole, and trazodone, and the following Facility-Administered Medications:  fentanyl and midazolam. Her primarily concern today is the Foot Pain (bilateral) and Hand Pain (bilateral)  The patient came in to the clinics today with her mother. They have a significant amount of questions to be answered before we started with the procedure. All of them were answered to their satisfaction. In general, they asked about risks and possible complications. They also asked to have the procedure described to them in detail and with the purpose of the procedure was. They also asked whether or not they could have the procedure done since she was on antibiotics for a pressure ulcer. Since she is afebrile, denies any chills or night fever, the procedure area is clean of infection, and she is already on her second round of antibiotics, we have decided to proceed. In addition, we will be using no steroids during this procedure and therefore this procedure should not decrease her immune system in any way.  Initial Vital Signs: Blood pressure 112/74, pulse 75, temperature 98.4 F (36.9 C), temperature source Oral, resp. rate 18, height 5\' 3"  (1.6 m), weight 273 lb (123.8 kg), SpO2 97 %. BMI: Estimated body mass index is 48.36 kg/m as calculated from the following:   Height as of this encounter: 5\' 3"  (1.6 m).   Weight as of this encounter: 273 lb (123.8 kg).  Risk Assessment: Allergies: Reviewed. She is allergic to biaxin [clarithromycin].  Allergy Precautions: None required Coagulopathies: Reviewed. None identified.  Blood-thinner therapy: None at this time Active Infection(s): Reviewed. None identified. [name] is afebrile  Site Confirmation: [name] was asked to confirm the procedure and laterality before marking the site Procedure checklist: Completed Consent: Before the procedure and under the influence of no sedative(s), amnesic(s), or anxiolytics, the patient was informed of the treatment options, risks and possible complications. To fulfill our ethical and legal obligations, as  recommended by the American Medical Association's Code  of Ethics, I have informed the patient of my clinical impression; the nature and purpose of the treatment or procedure; the risks, benefits, and possible complications of the intervention; the alternatives, including doing nothing; the risk(s) and benefit(s) of the alternative treatment(s) or procedure(s); and the risk(s) and benefit(s) of doing nothing. The patient was provided information about the general risks and possible complications associated with the procedure. These may include, but are not limited to: failure to achieve desired goals, infection, bleeding, organ or nerve damage, allergic reactions, paralysis, and death. In addition, the patient was informed of those risks and complications associated to Spine-related procedures, such as failure to decrease pain; infection (i.e.: Meningitis, epidural or intraspinal abscess); bleeding (i.e.: epidural hematoma, subarachnoid hemorrhage, or any other type of intraspinal or peri-dural bleeding); organ or nerve damage (i.e.: Any type of peripheral nerve, nerve root, or spinal cord injury) with subsequent damage to sensory, motor, and/or autonomic systems, resulting in permanent pain, numbness, and/or weakness of one or several areas of the body; allergic reactions; (i.e.: anaphylactic reaction); and/or death. Furthermore, the patient was informed of those risks and complications associated with the medications. These include, but are not limited to: allergic reactions (i.e.: anaphylactic or anaphylactoid reaction(s)); adrenal axis suppression; blood sugar elevation that in diabetics may result in ketoacidosis or comma; water retention that in patients with history of congestive heart failure may result in shortness of breath, pulmonary edema, and decompensation with resultant heart failure; weight gain; swelling or edema; medication-induced neural toxicity; particulate matter embolism and blood vessel  occlusion with resultant organ, and/or nervous system infarction; and/or aseptic necrosis of one or more joints. Finally, the patient was informed that Medicine is not an exact science; therefore, there is also the possibility of unforeseen or unpredictable risks and/or possible complications that may result in a catastrophic outcome. The patient indicated having understood very clearly. We have given the patient no guarantees and we have made no promises. Enough time was given to the patient to ask questions, all of which were answered to the patient's satisfaction. Ms. Skeet has indicated that she wanted to continue with the procedure. Attestation: I, the ordering provider, attest that I have discussed with the patient the benefits, risks, side-effects, alternatives, likelihood of achieving goals, and potential problems during recovery for the procedure that I have provided informed consent. Date: 07/25/2017; Time: 10:38 AM  Pre-Procedure Preparation:  Monitoring: As per clinic protocol. Respiration, ETCO2, SpO2, BP, heart rate and rhythm monitor placed and checked for adequate function Safety Precautions: Patient was assessed for positional comfort and pressure points before starting the procedure. Time-out: I initiated and conducted the "Time-out" before starting the procedure, as per protocol. The patient was asked to participate by confirming the accuracy of the "Time Out" information. Verification of the correct person, site, and procedure were performed and confirmed by me, the nursing staff, and the patient. "Time-out" conducted as per Joint Commission's Universal Protocol (UP.01.01.01). "Time-out" Date & Time: 07/25/2017; 1103 hrs.  Description of Procedure Process:  Position: Prone with head of the table was raised to facilitate breathing. Target Area: For Lumbar Sympathetic Block(s), the target is the anterolateral aspect of the L4 vertebral body, where the lumbar sympathetic chain  resides. Approach: Paravertebral, ipsilateral approach. Area Prepped: Entire Posterior Thoracolumbar Region Prepping solution:  ChloraPrep (2% chlorhexidine gluconate and 70% isopropyl alcohol) Safety Precautions: Aspiration looking for blood return was conducted prior to all injections. At no point did we inject any substances, as a needle was being advanced.  No attempts were made at seeking any paresthesias. Safe injection practices and needle disposal techniques used. Medications properly checked for expiration dates. SDV (single dose vial) medications used. Description of the Procedure: Protocol guidelines were followed. The patient was placed in position over the procedure table. The target area was identified and the area prepped in the usual manner. Skin & deeper tissues infiltrated with local anesthetic. Appropriate amount of time allowed to pass for local anesthetics to take effect. The procedure needles were then advanced to the target area, the superior anterolateral border of the L3 vertebral body, under pulsed fluoroscopic guidance. Care was taken not to advance the tip of the needle past the anterior border of the vertebral body, on the lateral fluoroscopic view. Proper needle placement secured. Negative aspiration confirmed. Solution injected in intermittent fashion, asking for systemic symptoms every 0.5cc of injectate. The needles were then removed and the area cleansed, making sure to leave some of the prepping solution back to take advantage of its long term bactericidal properties. Vitals:   07/25/17 1118 07/25/17 1128 07/25/17 1138 07/25/17 1148  BP: 121/67 134/64 134/64 139/61  Pulse:      Resp: 12 15 14 16   Temp:  (!) 96.4 F (35.8 C)    TempSrc:      SpO2: 95% 96% 98% 98%  Weight:      Height:        Start Time: 1105 hrs. End Time: 1117 hrs. Materials:  Needle(s) Type: Regular needle Gauge: 22G Length: 7-in Medication(s): We administered lactated ringers, midazolam,  fentaNYL, lidocaine, and bupivacaine-EPINEPHrine. Please see chart orders for dosing details.  Imaging Guidance (Spinal):  Type of Imaging Technique: Fluoroscopy Guidance (Spinal) Indication(s): Assistance in needle guidance and placement for procedures requiring needle placement in or near specific anatomical locations not easily accessible without such assistance. Exposure Time: Please see nurses notes. Contrast: Before injecting any contrast, we confirmed that the patient did not have an allergy to iodine, shellfish, or radiological contrast. Once satisfactory needle placement was completed at the desired level, radiological contrast was injected. Contrast injected under live fluoroscopy. No contrast complications. See chart for type and volume of contrast used. Fluoroscopic Guidance: I was personally present during the use of fluoroscopy. "Tunnel Vision Technique" used to obtain the best possible view of the target area. Parallax error corrected before commencing the procedure. "Direction-depth-direction" technique used to introduce the needle under continuous pulsed fluoroscopy. Once target was reached, antero-posterior, oblique, and lateral fluoroscopic projection used confirm needle placement in all planes. Images permanently stored in EMR. Interpretation: I personally interpreted the imaging intraoperatively. Adequate needle placement confirmed in multiple planes. Appropriate spread of contrast into desired area was observed. No evidence of afferent or efferent intravascular uptake. No intrathecal or subarachnoid spread observed. Permanent images saved into the patient's record.  Antibiotic Prophylaxis:  Indication(s): None identified Antibiotic given: None  Post-operative Assessment:  EBL: None Complications: No immediate post-treatment complications observed by team, or reported by patient. Note: The patient tolerated the entire procedure well. A repeat set of vitals were taken after the  procedure and the patient was kept under observation following institutional policy, for this type of procedure. Post-procedural neurological assessment was performed, showing return to baseline, prior to discharge. The patient was provided with post-procedure discharge instructions, including a section on how to identify potential problems. Should any problems arise concerning this procedure, the patient was given instructions to immediately contact us, at any time, without hesitation. In any case, we plan to contact the  patient by telephone for a follow-up status report regarding this interventional procedure. Comments:  No additional relevant information.  Plan of Care  Disposition: Discharge home  Discharge Date & Time: 07/25/2017; 1153 hrs.  Physician-requested Follow-up:  Return for post-procedure eval by Dr. Laban Emperor in 2 weeks.  Future Appointments Date Time Provider Department Center  08/02/2017 8:00 AM Allen Derry [place] III, PA-C [facility] None  08/07/2017 9:30 AM Darrick Grinder, RD [facility] None  08/16/2017 8:00 AM Maxwell Caul, MD [facility] None  08/16/2017 11:15 AM Delano Metz, MD [facility] None   Medications ordered for procedure: Meds ordered this encounter  Medications  . lactated ringers infusion 1,000 mL  . midazolam (VERSED) 5 MG/5ML injection 1-2 mg    Make sure Flumazenil is available in the pyxis when using this medication. If oversedation occurs, administer 0.2 mg IV over 15 sec. If after 45 sec no response, administer 0.2 mg again over 1 min; may repeat at 1 min intervals; not to exceed 4 doses (1 mg)  . fentaNYL (SUBLIMAZE) injection 25-50 mcg    Make sure Narcan is available in the pyxis when using this medication. In the event of respiratory depression (RR< 8/min): Titrate NARCAN (naloxone) in increments of 0.1 to 0.2 mg IV at 2-3 minute intervals, until desired degree of reversal.  . lidocaine 1.5 % injection 20 mL    From block tray  .  bupivacaine-EPINEPHrine (MARCAINE W/ EPI) 0.25% -1:200000 (with pres) injection 10 mL   Medications administered: We administered lactated ringers, midazolam, fentaNYL, lidocaine, and bupivacaine-EPINEPHrine.  See the medical record for exact dosing, route, and time of administration.  Lab-work, Procedure(s), & Referral(s) Ordered: Orders Placed This Encounter  Procedures  . LUMBAR SYMPATHETIC BLOCK  . DG C-Arm 1-60 Min-No Report  . Informed Consent Details: Transcribe to consent form and obtain patient signature  . Provider attestation of informed consent for procedure/surgical case  . Verify informed consent  . Discharge instructions  . Follow-up   Imaging Ordered: No results found for this or any previous visit. New Prescriptions   No medications on file   Primary Care Physician: Dortha Kern, MD Location: [facility] Note by: Oswaldo Done, MD Date: 07/25/2017; Time: 6:05 PM  Disclaimer:  Medicine is not an Visual merchandiser. The only guarantee in medicine is that nothing is guaranteed. It is important to note that the decision to proceed with this intervention was based on the information collected from the patient. The Data and conclusions were drawn from the patient's questionnaire, the interview, and the physical examination. Because the information was provided in large part by the patient, it cannot be guaranteed that it has not been purposely or unconsciously manipulated. Every effort has been made to obtain as much relevant data as possible for this evaluation. It is important to note that the conclusions that lead to this procedure are derived in large part from the available data. Always take into account that the treatment will also be dependent on availability of resources and existing treatment guidelines, considered by other Pain Management Practitioners as being common knowledge and practice, at the time of the intervention. For  Medico-Legal purposes, it is also important to point out that variation in procedural techniques and pharmacological choices are the acceptable norm. The indications, contraindications, technique, and results of the above procedure should only be interpreted and judged by a Board-Certified Interventional Pain Specialist with extensive familiarity and expertise in the same exact procedure and technique.  Instructions  provided at this appointment: Patient Instructions   ____________________________________________________________________________________________  Post-Procedure instructions Instructions:  Apply ice: Fill a plastic sandwich bag with crushed ice. Cover it with a small towel and apply to injection site. Apply for 15 minutes then remove x 15 minutes. Repeat sequence on day of procedure, until you go to bed. The purpose is to minimize swelling and discomfort after procedure.  Apply heat: Apply heat to procedure site starting the day following the procedure. The purpose is to treat any soreness and discomfort from the procedure.  Food intake: Start with clear liquids (like water) and advance to regular food, as tolerated.   Physical activities: Keep activities to a minimum for the first 8 hours after the procedure.   Driving: If you have received any sedation, you are not allowed to drive for 24 hours after your procedure.  Blood thinner: Restart your blood thinner 6 hours after your procedure. (Only for those taking blood thinners)  Insulin: As soon as you can eat, you may resume your normal dosing schedule. (Only for those taking insulin)  Infection prevention: Keep procedure site clean and dry.  Post-procedure Pain Diary: Extremely important that this be done correctly and accurately. Recorded information will be used to determine the next step in treatment.  Pain evaluated is that of treated area only. Do not include pain from an untreated area.  Complete every hour, on the  hour, for the initial 8 hours. Set an alarm to help you do this part accurately.  Do not go to sleep and have it completed later. It will not be accurate.  Follow-up appointment: Keep your follow-up appointment after the procedure. Usually 2 weeks for most procedures. (6 weeks in the case of radiofrequency.) Bring you pain diary.  Expect:  From numbing medicine (AKA: Local Anesthetics): Numbness or decrease in pain.  Onset: Full effect within 15 minutes of injected.  Duration: It will depend on the type of local anesthetic used. On the average, 1 to 8 hours.   From steroids: NO STEROIDS GIVEN.  From procedure: Some discomfort is to be expected once the numbing medicine wears off. This should be minimal if ice and heat are applied as instructed. Call if:  You experience numbness and weakness that gets worse with time, as opposed to wearing off.  New onset bowel or bladder incontinence. (Spinal procedures only)  Emergency Numbers:  Durning business hours (Monday - Thursday, 8:00 AM - 4:00 PM) (Friday, 9:00 AM - 12:00 Noon): [phone]  After hours: [phone] ____________________________________________________________________________________________  Pain Management Discharge Instructions  General Discharge Instructions :  If you need to reach your doctor call: Monday-Friday 8:00 am - 4:00 pm at [phone] or toll free [phone].  After clinic hours [phone] to have operator reach doctor.  Bring all of your medication bottles to all your appointments in the pain clinic.  To cancel or reschedule your appointment with Pain Management please remember to call 24 hours in advance to avoid a fee.  Refer to the educational materials which you have been given on: General Risks, I had my Procedure. Discharge Instructions, Post Sedation.  Post Procedure Instructions:  The drugs you were given will stay in your system until tomorrow, so for the next 24 hours you  should not drive, make any legal decisions or drink any alcoholic beverages.  You may eat anything you prefer, but it is better to start with liquids then soups and crackers, and gradually work up to solid foods.  Please notify your  doctor immediately if you have any unusual bleeding, trouble breathing or pain that is not related to your normal pain.  Depending on the type of procedure that was done, some parts of your body may feel week and/or numb.  This usually clears up by tonight or the next day.  Walk with the use of an assistive device or accompanied by an adult for the 24 hours.  You may use ice on the affected area for the first 24 hours.  Put ice in a Ziploc bag and cover with a towel and place against area 15 minutes on 15 minutes off.  You may switch to heat after 24 hours. Sympathetic Nerve Block, Care After These instructions provide you with information about caring for yourself after your procedure. Your health care provider may also give you more specific instructions. Your treatment has been planned according to current medical practices, but problems sometimes occur. Call your health care provider if you have any problems or questions after your procedure. What can I expect after the procedure? After your procedure, it is common for the area where the medicine was injected to be:  Sore.  Warm.  Weak.  Numb.  If the injection was made in your neck, you may also have:  Voice changes.  A droopy eyelid.  Trouble swallowing.  A stuffy nose.  Follow these instructions at home:  For the first 24 hours after your procedure: ? Do not drive. ? Rest. ? Avoid activities that require a lot of energy.  Keep track of the amount of pain relief that you feel and how long it lasts.  Do not apply heat near or over the injection sites.  Do not take a bath or soak in water, such as in a pool or lake, until your health care provider approves.  Take over-the-counter and  prescription medicines only as told by your health care provider.  If you have trouble swallowing, take small bites when eating and small sips of water when drinking until you are able to swallow normally.  Keep all follow-up visits as told by your health care provider. This is important. Contact a health care provider if:  You have numbness that lasts longer than 8 hours.  You continue to have pain for more than 24 hours after your procedure.  You have worsening pain or swelling around an injection site.  There are red streaks around an injection site. Get help right away if:  You cannot swallow.  You have chest pain.  You have trouble breathing. This information is not intended to replace advice given to you by your health care provider. Make sure you discuss any questions you have with your health care provider. Document Released: 04/28/2014 Document Revised: 05/20/2016 Document Reviewed: 04/07/2016 Elsevier Interactive Patient Education  2018 [employer]. GENERAL RISKS AND COMPLICATIONS  What are the risk, side effects and possible complications? Generally speaking, most procedures are safe.  However, with any procedure there are risks, side effects, and the possibility of complications.  The risks and complications are dependent upon the sites that are lesioned, or the type of nerve block to be performed.  The closer the procedure is to the spine, the more serious the risks are.  Great care is taken when placing the radio frequency needles, block needles or lesioning probes, but sometimes complications can occur. 1. Infection: Any time there is an injection through the skin, there is a risk of infection.  This is why sterile conditions are used for  these blocks.  There are four possible types of infection. 1. Localized skin infection. 2. Central Nervous System Infection-This can be in the form of Meningitis, which can be deadly. 3. Epidural Infections-This can be in the form of  an epidural abscess, which can cause pressure inside of the spine, causing compression of the spinal cord with subsequent paralysis. This would require an emergency surgery to decompress, and there are no guarantees that the patient would recover from the paralysis. 4. Discitis-This is an infection of the intervertebral discs.  It occurs in about 1% of discography procedures.  It is difficult to treat and it may lead to surgery.        2. Pain: the needles have to go through skin and soft tissues, will cause soreness.       3. Damage to internal structures:  The nerves to be lesioned may be near blood vessels or    other nerves which can be potentially damaged.       4. Bleeding: Bleeding is more common if the patient is taking blood thinners such as  aspirin, Coumadin, Ticiid, Plavix, etc., or if he/she have some genetic predisposition  such as hemophilia. Bleeding into the spinal canal can cause compression of the spinal  cord with subsequent paralysis.  This would require an emergency surgery to  decompress and there are no guarantees that the patient would recover from the  paralysis.       5. Pneumothorax:  Puncturing of a lung is a possibility, every time a needle is introduced in  the area of the chest or upper back.  Pneumothorax refers to free air around the  collapsed lung(s), inside of the thoracic cavity (chest cavity).  Another two possible  complications related to a similar event would include: Hemothorax and Chylothorax.   These are variations of the Pneumothorax, where instead of air around the collapsed  lung(s), you may have blood or chyle, respectively.       6. Spinal headaches: They may occur with any procedures in the area of the spine.       7. Persistent CSF (Cerebro-Spinal Fluid) leakage: This is a rare problem, but may occur  with prolonged intrathecal or epidural catheters either due to the formation of a fistulous  track or a dural tear.       8. Nerve damage: By working so  close to the spinal cord, there is always a possibility of  nerve damage, which could be as serious as a permanent spinal cord injury with  paralysis.       9. Death:  Although rare, severe deadly allergic reactions known as "Anaphylactic  reaction" can occur to any of the medications used.      10. Worsening of the symptoms:  We can always make thing worse.  What are the chances of something like this happening? Chances of any of this occuring are extremely low.  By statistics, you have more of a chance of getting killed in a motor vehicle accident: while driving to the hospital than any of the above occurring .  Nevertheless, you should be aware that they are possibilities.  In general, it is similar to taking a shower.  Everybody knows that you can slip, hit your head and get killed.  Does that mean that you should not shower again?  Nevertheless always keep in mind that statistics do not mean anything if you happen to be on the wrong side of them.  Even if a  procedure has a 1 (one) in a 1,000,000 (million) chance of going wrong, it you happen to be that one..Also, keep in mind that by statistics, you have more of a chance of having something go wrong when taking medications.  Who should not have this procedure? If you are on a blood thinning medication (e.g. Coumadin, Plavix, see list of "Blood Thinners"), or if you have an active infection going on, you should not have the procedure.  If you are taking any blood thinners, please inform your physician.  How should I prepare for this procedure?  Do not eat or drink anything at least six hours prior to the procedure.  Bring a driver with you .  It cannot be a taxi.  Come accompanied by an adult that can drive you back, and that is strong enough to help you if your legs get weak or numb from the local anesthetic.  Take all of your medicines the morning of the procedure with just enough water to swallow them.  If you have diabetes, make sure that  you are scheduled to have your procedure done first thing in the morning, whenever possible.  If you have diabetes, take only half of your insulin dose and notify our nurse that you have done so as soon as you arrive at the clinic.  If you are diabetic, but only take blood sugar pills (oral hypoglycemic), then do not take them on the morning of your procedure.  You may take them after you have had the procedure.  Do not take aspirin or any aspirin-containing medications, at least eleven (11) days prior to the procedure.  They may prolong bleeding.  Wear loose fitting clothing that may be easy to take off and that you would not mind if it got stained with Betadine or blood.  Do not wear any jewelry or perfume  Remove any nail coloring.  It will interfere with some of our monitoring equipment.  NOTE: Remember that this is not meant to be interpreted as a complete list of all possible complications.  Unforeseen problems may occur.  BLOOD THINNERS The following drugs contain aspirin or other products, which can cause increased bleeding during surgery and should not be taken for 2 weeks prior to and 1 week after surgery.  If you should need take something for relief of minor pain, you may take acetaminophen which is found in Tylenol,m Datril, Anacin-3 and Panadol. It is not blood thinner. The products listed below are.  Do not take any of the products listed below in addition to any listed on your instruction sheet.  A.P.C or A.P.C with Codeine Codeine Phosphate Capsules #3 Ibuprofen Ridaura  ABC compound Congesprin Imuran rimadil  Advil Cope Indocin Robaxisal  Alka-Seltzer Effervescent Pain Reliever and Antacid Coricidin or Coricidin-D  Indomethacin Rufen  Alka-Seltzer plus Cold Medicine Cosprin Ketoprofen S-A-C Tablets  Anacin Analgesic Tablets or Capsules Coumadin Korlgesic Salflex  Anacin Extra Strength Analgesic tablets or capsules CP-2 Tablets Lanoril Salicylate  Anaprox Cuprimine  Capsules Levenox Salocol  Anexsia-D Dalteparin Magan Salsalate  Anodynos Darvon compound Magnesium Salicylate Sine-off  Ansaid Dasin Capsules Magsal Sodium Salicylate  Anturane Depen Capsules Marnal Soma  APF Arthritis pain formula Dewitt's Pills Measurin Stanback  Argesic Dia-Gesic Meclofenamic Sulfinpyrazone  Arthritis Bayer Timed Release Aspirin Diclofenac Meclomen Sulindac  Arthritis pain formula Anacin Dicumarol Medipren Supac  Analgesic (Safety coated) Arthralgen Diffunasal Mefanamic Suprofen  Arthritis Strength Bufferin Dihydrocodeine Mepro Compound Suprol  Arthropan liquid Dopirydamole Methcarbomol with Aspirin Synalgos  ASA  tablets/Enseals Disalcid Micrainin Tagament  Ascriptin Doan's Midol Talwin  Ascriptin A/D Dolene Mobidin Tanderil  Ascriptin Extra Strength Dolobid Moblgesic Ticlid  Ascriptin with Codeine Doloprin or Doloprin with Codeine Momentum Tolectin  Asperbuf Duoprin Mono-gesic Trendar  Aspergum Duradyne Motrin or Motrin IB Triminicin  Aspirin plain, buffered or enteric coated Durasal Myochrisine Trigesic  Aspirin Suppositories Easprin Nalfon Trillsate  Aspirin with Codeine Ecotrin Regular or Extra Strength Naprosyn Uracel  Atromid-S Efficin Naproxen Ursinus  Auranofin Capsules Elmiron Neocylate Vanquish  Axotal Emagrin Norgesic Verin  Azathioprine Empirin or Empirin with Codeine Normiflo Vitamin E  Azolid Emprazil Nuprin Voltaren  Bayer Aspirin plain, buffered or children's or timed BC Tablets or powders Encaprin Orgaran Warfarin Sodium  Buff-a-Comp Enoxaparin Orudis Zorpin  Buff-a-Comp with Codeine Equegesic Os-Cal-Gesic   Buffaprin Excedrin plain, buffered or Extra Strength Oxalid   Bufferin Arthritis Strength Feldene Oxphenbutazone   Bufferin plain or Extra Strength Feldene Capsules Oxycodone with Aspirin   Bufferin with Codeine Fenoprofen Fenoprofen Pabalate or Pabalate-SF   Buffets II Flogesic Panagesic   Buffinol plain or Extra Strength Florinal or  Florinal with Codeine Panwarfarin   Buf-Tabs Flurbiprofen Penicillamine   Butalbital Compound Four-way cold tablets Penicillin   Butazolidin Fragmin Pepto-Bismol   Carbenicillin Geminisyn Percodan   Carna Arthritis Reliever Geopen Persantine   Carprofen Gold's salt Persistin   Chloramphenicol Goody's Phenylbutazone   Chloromycetin Haltrain Piroxlcam   Clmetidine heparin Plaquenil   Cllnoril Hyco-pap Ponstel   Clofibrate Hydroxy chloroquine Propoxyphen         Before stopping any of these medications, be sure to consult the physician who ordered them.  Some, such as Coumadin (Warfarin) are ordered to prevent or treat serious conditions such as "deep thrombosis", "pumonary embolisms", and other heart problems.  The amount of time that you may need off of the medication may also vary with the medication and the reason for which you were taking it.  If you are taking any of these medications, please make sure you notify your pain physician before you undergo any procedures.

## 2017-07-25 NOTE — Progress Notes (Signed)
Safety precautions to be maintained throughout the outpatient stay will include: orient to surroundings, keep bed in low position, maintain call bell within reach at all times, provide assistance with transfer out of bed and ambulation.  

## 2017-07-25 NOTE — Patient Instructions (Addendum)
____________________________________________________________________________________________  Post-Procedure instructions Instructions:  Apply ice: Fill a plastic sandwich bag with crushed ice. Cover it with a small towel and apply to injection site. Apply for 15 minutes then remove x 15 minutes. Repeat sequence on day of procedure, until you go to bed. The purpose is to minimize swelling and discomfort after procedure.  Apply heat: Apply heat to procedure site starting the day following the procedure. The purpose is to treat any soreness and discomfort from the procedure.  Food intake: Start with clear liquids (like water) and advance to regular food, as tolerated.   Physical activities: Keep activities to a minimum for the first 8 hours after the procedure.   Driving: If you have received any sedation, you are not allowed to drive for 24 hours after your procedure.  Blood thinner: Restart your blood thinner 6 hours after your procedure. (Only for those taking blood thinners)  Insulin: As soon as you can eat, you may resume your normal dosing schedule. (Only for those taking insulin)  Infection prevention: Keep procedure site clean and dry.  Post-procedure Pain Diary: Extremely important that this be done correctly and accurately. Recorded information will be used to determine the next step in treatment.  Pain evaluated is that of treated area only. Do not include pain from an untreated area.  Complete every hour, on the hour, for the initial 8 hours. Set an alarm to help you do this part accurately.  Do not go to sleep and have it completed later. It will not be accurate.  Follow-up appointment: Keep your follow-up appointment after the procedure. Usually 2 weeks for most procedures. (6 weeks in the case of radiofrequency.) Bring you pain diary.  Expect:  From numbing medicine (AKA: Local Anesthetics): Numbness or decrease in pain.  Onset: Full effect within 15 minutes of  injected.  Duration: It will depend on the type of local anesthetic used. On the average, 1 to 8 hours.   From steroids: NO STEROIDS GIVEN.  From procedure: Some discomfort is to be expected once the numbing medicine wears off. This should be minimal if ice and heat are applied as instructed. Call if:  You experience numbness and weakness that gets worse with time, as opposed to wearing off.  New onset bowel or bladder incontinence. (Spinal procedures only)  Emergency Numbers:  Durning business hours (Monday - Thursday, 8:00 AM - 4:00 PM) (Friday, 9:00 AM - 12:00 Noon): [phone]  After hours: [phone] ____________________________________________________________________________________________  Pain Management Discharge Instructions  General Discharge Instructions :  If you need to reach your doctor call: Monday-Friday 8:00 am - 4:00 pm at [phone] or toll free [phone].  After clinic hours [phone] to have operator reach doctor.  Bring all of your medication bottles to all your appointments in the pain clinic.  To cancel or reschedule your appointment with Pain Management please remember to call 24 hours in advance to avoid a fee.  Refer to the educational materials which you have been given on: General Risks, I had my Procedure. Discharge Instructions, Post Sedation.  Post Procedure Instructions:  The drugs you were given will stay in your system until tomorrow, so for the next 24 hours you should not drive, make any legal decisions or drink any alcoholic beverages.  You may eat anything you prefer, but it is better to start with liquids then soups and crackers, and gradually work up to solid foods.  Please notify your doctor immediately if you have any unusual bleeding, trouble  breathing or pain that is not related to your normal pain.  Depending on the type of procedure that was done, some parts of your body may feel week and/or numb.  This usually  clears up by tonight or the next day.  Walk with the use of an assistive device or accompanied by an adult for the 24 hours.  You may use ice on the affected area for the first 24 hours.  Put ice in a Ziploc bag and cover with a towel and place against area 15 minutes on 15 minutes off.  You may switch to heat after 24 hours. Sympathetic Nerve Block, Care After These instructions provide you with information about caring for yourself after your procedure. Your health care provider may also give you more specific instructions. Your treatment has been planned according to current medical practices, but problems sometimes occur. Call your health care provider if you have any problems or questions after your procedure. What can I expect after the procedure? After your procedure, it is common for the area where the medicine was injected to be:  Sore.  Warm.  Weak.  Numb.  If the injection was made in your neck, you may also have:  Voice changes.  A droopy eyelid.  Trouble swallowing.  A stuffy nose.  Follow these instructions at home:  For the first 24 hours after your procedure: ? Do not drive. ? Rest. ? Avoid activities that require a lot of energy.  Keep track of the amount of pain relief that you feel and how long it lasts.  Do not apply heat near or over the injection sites.  Do not take a bath or soak in water, such as in a pool or lake, until your health care provider approves.  Take over-the-counter and prescription medicines only as told by your health care provider.  If you have trouble swallowing, take small bites when eating and small sips of water when drinking until you are able to swallow normally.  Keep all follow-up visits as told by your health care provider. This is important. Contact a health care provider if:  You have numbness that lasts longer than 8 hours.  You continue to have pain for more than 24 hours after your procedure.  You have worsening  pain or swelling around an injection site.  There are red streaks around an injection site. Get help right away if:  You cannot swallow.  You have chest pain.  You have trouble breathing. This information is not intended to replace advice given to you by your health care provider. Make sure you discuss any questions you have with your health care provider. Document Released: 04/28/2014 Document Revised: 05/20/2016 Document Reviewed: 04/07/2016 Elsevier Interactive Patient Education  2018 [employer]. GENERAL RISKS AND COMPLICATIONS  What are the risk, side effects and possible complications? Generally speaking, most procedures are safe.  However, with any procedure there are risks, side effects, and the possibility of complications.  The risks and complications are dependent upon the sites that are lesioned, or the type of nerve block to be performed.  The closer the procedure is to the spine, the more serious the risks are.  Great care is taken when placing the radio frequency needles, block needles or lesioning probes, but sometimes complications can occur. 1. Infection: Any time there is an injection through the skin, there is a risk of infection.  This is why sterile conditions are used for these blocks.  There are four possible types  of infection. 1. Localized skin infection. 2. Central Nervous System Infection-This can be in the form of Meningitis, which can be deadly. 3. Epidural Infections-This can be in the form of an epidural abscess, which can cause pressure inside of the spine, causing compression of the spinal cord with subsequent paralysis. This would require an emergency surgery to decompress, and there are no guarantees that the patient would recover from the paralysis. 4. Discitis-This is an infection of the intervertebral discs.  It occurs in about 1% of discography procedures.  It is difficult to treat and it may lead to surgery.        2. Pain: the needles have to go  through skin and soft tissues, will cause soreness.       3. Damage to internal structures:  The nerves to be lesioned may be near blood vessels or    other nerves which can be potentially damaged.       4. Bleeding: Bleeding is more common if the patient is taking blood thinners such as  aspirin, Coumadin, Ticiid, Plavix, etc., or if he/she have some genetic predisposition  such as hemophilia. Bleeding into the spinal canal can cause compression of the spinal  cord with subsequent paralysis.  This would require an emergency surgery to  decompress and there are no guarantees that the patient would recover from the  paralysis.       5. Pneumothorax:  Puncturing of a lung is a possibility, every time a needle is introduced in  the area of the chest or upper back.  Pneumothorax refers to free air around the  collapsed lung(s), inside of the thoracic cavity (chest cavity).  Another two possible  complications related to a similar event would include: Hemothorax and Chylothorax.   These are variations of the Pneumothorax, where instead of air around the collapsed  lung(s), you may have blood or chyle, respectively.       6. Spinal headaches: They may occur with any procedures in the area of the spine.       7. Persistent CSF (Cerebro-Spinal Fluid) leakage: This is a rare problem, but may occur  with prolonged intrathecal or epidural catheters either due to the formation of a fistulous  track or a dural tear.       8. Nerve damage: By working so close to the spinal cord, there is always a possibility of  nerve damage, which could be as serious as a permanent spinal cord injury with  paralysis.       9. Death:  Although rare, severe deadly allergic reactions known as "Anaphylactic  reaction" can occur to any of the medications used.      10. Worsening of the symptoms:  We can always make thing worse.  What are the chances of something like this happening? Chances of any of this occuring are extremely low.  By  statistics, you have more of a chance of getting killed in a motor vehicle accident: while driving to the hospital than any of the above occurring .  Nevertheless, you should be aware that they are possibilities.  In general, it is similar to taking a shower.  Everybody knows that you can slip, hit your head and get killed.  Does that mean that you should not shower again?  Nevertheless always keep in mind that statistics do not mean anything if you happen to be on the wrong side of them.  Even if a procedure has a 1 (one) in a 1,000,000 (  million) chance of going wrong, it you happen to be that one..Also, keep in mind that by statistics, you have more of a chance of having something go wrong when taking medications.  Who should not have this procedure? If you are on a blood thinning medication (e.g. Coumadin, Plavix, see list of "Blood Thinners"), or if you have an active infection going on, you should not have the procedure.  If you are taking any blood thinners, please inform your physician.  How should I prepare for this procedure?  Do not eat or drink anything at least six hours prior to the procedure.  Bring a driver with you .  It cannot be a taxi.  Come accompanied by an adult that can drive you back, and that is strong enough to help you if your legs get weak or numb from the local anesthetic.  Take all of your medicines the morning of the procedure with just enough water to swallow them.  If you have diabetes, make sure that you are scheduled to have your procedure done first thing in the morning, whenever possible.  If you have diabetes, take only half of your insulin dose and notify our nurse that you have done so as soon as you arrive at the clinic.  If you are diabetic, but only take blood sugar pills (oral hypoglycemic), then do not take them on the morning of your procedure.  You may take them after you have had the procedure.  Do not take aspirin or any aspirin-containing  medications, at least eleven (11) days prior to the procedure.  They may prolong bleeding.  Wear loose fitting clothing that may be easy to take off and that you would not mind if it got stained with Betadine or blood.  Do not wear any jewelry or perfume  Remove any nail coloring.  It will interfere with some of our monitoring equipment.  NOTE: Remember that this is not meant to be interpreted as a complete list of all possible complications.  Unforeseen problems may occur.  BLOOD THINNERS The following drugs contain aspirin or other products, which can cause increased bleeding during surgery and should not be taken for 2 weeks prior to and 1 week after surgery.  If you should need take something for relief of minor pain, you may take acetaminophen which is found in Tylenol,m Datril, Anacin-3 and Panadol. It is not blood thinner. The products listed below are.  Do not take any of the products listed below in addition to any listed on your instruction sheet.  A.P.C or A.P.C with Codeine Codeine Phosphate Capsules #3 Ibuprofen Ridaura  ABC compound Congesprin Imuran rimadil  Advil Cope Indocin Robaxisal  Alka-Seltzer Effervescent Pain Reliever and Antacid Coricidin or Coricidin-D  Indomethacin Rufen  Alka-Seltzer plus Cold Medicine Cosprin Ketoprofen S-A-C Tablets  Anacin Analgesic Tablets or Capsules Coumadin Korlgesic Salflex  Anacin Extra Strength Analgesic tablets or capsules CP-2 Tablets Lanoril Salicylate  Anaprox Cuprimine Capsules Levenox Salocol  Anexsia-D Dalteparin Magan Salsalate  Anodynos Darvon compound Magnesium Salicylate Sine-off  Ansaid Dasin Capsules Magsal Sodium Salicylate  Anturane Depen Capsules Marnal Soma  APF Arthritis pain formula Dewitt's Pills Measurin Stanback  Argesic Dia-Gesic Meclofenamic Sulfinpyrazone  Arthritis Bayer Timed Release Aspirin Diclofenac Meclomen Sulindac  Arthritis pain formula Anacin Dicumarol Medipren Supac  Analgesic (Safety coated)  Arthralgen Diffunasal Mefanamic Suprofen  Arthritis Strength Bufferin Dihydrocodeine Mepro Compound Suprol  Arthropan liquid Dopirydamole Methcarbomol with Aspirin Synalgos  ASA tablets/Enseals Disalcid Micrainin Tagament  Ascriptin Doan's Midol  Talwin  Ascriptin A/D Dolene Mobidin Tanderil  Ascriptin Extra Strength Dolobid Moblgesic Ticlid  Ascriptin with Codeine Doloprin or Doloprin with Codeine Momentum Tolectin  Asperbuf Duoprin Mono-gesic Trendar  Aspergum Duradyne Motrin or Motrin IB Triminicin  Aspirin plain, buffered or enteric coated Durasal Myochrisine Trigesic  Aspirin Suppositories Easprin Nalfon Trillsate  Aspirin with Codeine Ecotrin Regular or Extra Strength Naprosyn Uracel  Atromid-S Efficin Naproxen Ursinus  Auranofin Capsules Elmiron Neocylate Vanquish  Axotal Emagrin Norgesic Verin  Azathioprine Empirin or Empirin with Codeine Normiflo Vitamin E  Azolid Emprazil Nuprin Voltaren  Bayer Aspirin plain, buffered or children's or timed BC Tablets or powders Encaprin Orgaran Warfarin Sodium  Buff-a-Comp Enoxaparin Orudis Zorpin  Buff-a-Comp with Codeine Equegesic Os-Cal-Gesic   Buffaprin Excedrin plain, buffered or Extra Strength Oxalid   Bufferin Arthritis Strength Feldene Oxphenbutazone   Bufferin plain or Extra Strength Feldene Capsules Oxycodone with Aspirin   Bufferin with Codeine Fenoprofen Fenoprofen Pabalate or Pabalate-SF   Buffets II Flogesic Panagesic   Buffinol plain or Extra Strength Florinal or Florinal with Codeine Panwarfarin   Buf-Tabs Flurbiprofen Penicillamine   Butalbital Compound Four-way cold tablets Penicillin   Butazolidin Fragmin Pepto-Bismol   Carbenicillin Geminisyn Percodan   Carna Arthritis Reliever Geopen Persantine   Carprofen Gold's salt Persistin   Chloramphenicol Goody's Phenylbutazone   Chloromycetin Haltrain Piroxlcam   Clmetidine heparin Plaquenil   Cllnoril Hyco-pap Ponstel   Clofibrate Hydroxy chloroquine Propoxyphen          Before stopping any of these medications, be sure to consult the physician who ordered them.  Some, such as Coumadin (Warfarin) are ordered to prevent or treat serious conditions such as "deep thrombosis", "pumonary embolisms", and other heart problems.  The amount of time that you may need off of the medication may also vary with the medication and the reason for which you were taking it.  If you are taking any of these medications, please make sure you notify your pain physician before you undergo any procedures.

## 2017-07-26 NOTE — Telephone Encounter (Signed)
Denies any needs at this time. Instructed to call if needed. 

## 2017-08-02 ENCOUNTER — Encounter: Payer: BLUE CROSS/BLUE SHIELD | Attending: Physician Assistant | Admitting: Physician Assistant

## 2017-08-02 DIAGNOSIS — J449 Chronic obstructive pulmonary disease, unspecified: Secondary | ICD-10-CM | POA: Insufficient documentation

## 2017-08-02 DIAGNOSIS — F17210 Nicotine dependence, cigarettes, uncomplicated: Secondary | ICD-10-CM | POA: Diagnosis not present

## 2017-08-02 DIAGNOSIS — I10 Essential (primary) hypertension: Secondary | ICD-10-CM | POA: Insufficient documentation

## 2017-08-02 DIAGNOSIS — E1142 Type 2 diabetes mellitus with diabetic polyneuropathy: Secondary | ICD-10-CM | POA: Insufficient documentation

## 2017-08-02 DIAGNOSIS — F411 Generalized anxiety disorder: Secondary | ICD-10-CM | POA: Insufficient documentation

## 2017-08-02 DIAGNOSIS — L89314 Pressure ulcer of right buttock, stage 4: Secondary | ICD-10-CM | POA: Insufficient documentation

## 2017-08-02 DIAGNOSIS — K219 Gastro-esophageal reflux disease without esophagitis: Secondary | ICD-10-CM | POA: Diagnosis not present

## 2017-08-04 NOTE — Progress Notes (Signed)
[name] ([id number]) Visit Report for 08/02/2017 Chief Complaint Document Details Patient Name: [name], [name] Date of Service: 08/02/2017 8:00 AM Medical Record Number: [id number] Patient Account Number: [id number] Date of Birth/Sex: [birth year] (56 y.o. Female) Treating RN: Ashok Cordia, Debi Primary Care Provider: Joen [name] Other Clinician: Referring Provider: Joen [name] Treating Provider/Extender: Linwood Dibbles, Joaquin Knebel Weeks in Treatment: 45 Information Obtained from: Patient Chief Complaint Patient is here for follow-up of a relation of her right ischial pressure ulcer Electronic Signature(s) Signed: 08/02/2017 6:07:57 PM By: Lenda Kelp PA-C Entered By: Lenda Kelp on 08/02/2017 08:33:43 Brazie, [name] ([id number]) -------------------------------------------------------------------------------- Debridement Details Patient Name: [name] Date of Service: 08/02/2017 8:00 AM Medical Record Number: [id number] Patient Account Number: [id number] Date of Birth/Sex: October 07, 1961 (56 y.o. Female) Treating RN: Ashok Cordia, Debi Primary Care Provider: Joen [name] Other Clinician: Referring Provider: Joen [name] Treating Provider/Extender: Linwood Dibbles, Rayansh Herbst Weeks in Treatment: 45 Debridement Performed for Wound #3 Right Gluteal fold Assessment: Performed By: Physician STONE III, Imanie Darrow E., PA-C Debridement: Debridement Pre-procedure Verification/Time Out Yes - 08:28 Taken: Start Time: 08:29 Pain Control: Lidocaine 4% Topical Solution Level: Skin/Subcutaneous Tissue Total Area Debrided ([name] x 0.9 (cm) x 1.2 (cm) = 1.08 (cm) W): Tissue and other Viable, Non-Viable, Exudate, Fibrin/Slough, Subcutaneous material debrided: Instrument: Curette Bleeding: Minimum Hemostasis Achieved: Pressure End Time: 08:31 Procedural Pain: 0 Post Procedural Pain: 0 Response to Treatment: Procedure was tolerated well Post Debridement Measurements of Total Wound Length: (cm) 0.9 Stage:  Category/Stage IV Width: (cm) 1.2 Depth: (cm) 2 Volume: (cm) 1.696 Character of Wound/Ulcer Post Requires Further Debridement: Debridement Post Procedure Diagnosis Same as Pre-procedure Electronic Signature(s) Signed: 08/02/2017 4:55:24 PM By: Alejandro Mulling Signed: 08/02/2017 6:07:57 PM By: Lenda Kelp PA-C Entered By: Alejandro Mulling on 08/02/2017 08:31:35 Fiore, [name] ([id number]) -------------------------------------------------------------------------------- HPI Details Patient Name: [name] Date of Service: 08/02/2017 8:00 AM Medical Record Number: [id number] Patient Account Number: [id number] Date of Birth/Sex: 1961/03/02 (56 y.o. Female) Treating RN: Ashok Cordia, Debi Primary Care Provider: Joen [name] Other Clinician: Referring Provider: Joen [name] Treating Provider/Extender: Linwood Dibbles, Taj Nevins Weeks in Treatment: 45 History of Present Illness Location: bilateral gluteal ulcerations and left popliteal ulceration with tunneling Quality: adits to intermittent aching to ischial ulcers, no discomfort to sinus tract Severity: right iscial ulcer with increased depth Duration: chronic ulcers to bilateral ischial ulcers and sinus tract Timing: The pain is intermittent in severity as far as how intense it becomes but is present all the time. Manipulationn makes this worse. Context: The wound occurred when the patient had a fall and was unconscious for about 48 hours laying on the floor and she had pressure injury at that stage. Modifying Factors: Other treatment(s) tried include:as noted below she has been seen by visiting wound care physicians or nurse practitioners and details have been noted Associated Signs and Symptoms: has yet to receive offloading cushions and mattress overlay HPI Description: 56 year old patient who was seen by visiting [name] wound care specialist for a wound on both her buttock and was found to have an unstageable wound on the right buttock for  about 2 months. I understand that she had a fall and was laying on the floor for about 48 hours before she was found and taken to the ICU and had a long injury to her gluteal area from pressure and also had broken her right humerus. She has had a right proximal humerus fracture and has been followed up with orthopedics recently. The patient has a past medical  history of type 2 diabetes mellitus, paraparesis, acute pyelonephritis, GERD, hypertension, glaucoma, chronic pain, anxiety neurosis, nicotine dependence, COPD. the patient had some debridement done and was to operative was recommended to use Silvadene dressing and offloading. She is a smoker and occasionally smokes a few cigarettes. the patient requested a second opinion for months and is here to discuss her care. 09/21/16; the patient re-presents from home today for review of 3 different wounds. I note that she was seen in the clinic here in July at which time she had bilateral buttock wounds. It was apparently suggested at that time that she use a wound VAC bridged to both wounds just near the initial tuberosity's bilaterally which she refused. The history was a bit difficult to put together. Apparently this patient became ill at the end of April of this year. She was found sitting on the floor she had apparently been for 2 days and subsequently admitted to hospital from 04/23/16 through 05/02/16 and at that point she was critically ill ultimately having sepsis secondary to UTI, nontraumatic rhabdomyolysis and diabetic ketoacidosis. She had acute renal failure and I think required ICU care including intubation. Patient states her wounds actually started at that point on the bilateral issue tuberosities however in reviewing the discharge summary from 5/8 I see no reference to wounds at that point. It did state that she had left lower extremity cellulitis however. Reviewing Epic I see no relevant x-rays. It would appear that her discharge  creatinine was within the normal range and indeed on 9/15 her creatinine has remained normal. [facility] There the wounds on her bilateral Buttocks were dressed. Only just before her discharge from the nursing facility she developed an "knot" which was interpreted as cellulitis on the posterior aspect of her Morandi, Sabrinia [name]. ([id number]) left knee she was given antibiotics. Apparently sometime late in July a this actually opened and became a wound at home health care was tending to however she is still having purulent drainage coming from this and by my understanding the wound depth is actually become unmeasurable. I am not really clear about what home health has been placing in any of these wound areas. The patient states that is something with silver and it. She is not been systemically unwell no fever or chills her appetite is good. She is a diabetic poorly controlled however she states that her recent blood sugars at home have been in the low to mid 100s. 09/28/16 On evaluation today patient appears to continue to exhibit the 3 areas of ulceration that were noted previous. She did have an x-ray of the right pelvis which showed evidence of potential soft tissue infection but no obvious osteomyelitis. There was a discussion last office visit concerning the possibility of a wound VAC. Witth that being said the x-ray report suggested that an MRI may be more appropriate to further evaluate the extent. Subsequently in regard to the wound over the popliteal portion of the left lower extremity with tunneling at 12:00 the CT scan that was ordered was denied by insurance as they state the patient has not had x-rays prior to advanced imaging. Patient states that she is frustrated with the situation overall. 10/05/16 in the interval since I last saw this patient last week she has had the x-ray of the knee performed. I did review that x-ray today  and fortunately shows no evidence of osteomyelitis or other acute abnormality at this point in time. She  continues to have the opening iin the posterior left popliteal space with tracking proximal up the posterior thigh. Nothing seems to have worsened but it also seems to have not improved. The same is true in regard to the right pressure ulcer over the gluteal region which extends toward the ischium. The left gluteal pressure ulcer actually appears to be doing somewhat better my opinion there is some necrotic slough but overall this appears fairly well. She tells me thatt she has some discomfort especially when home health is helping her with dressing changes as they do not know her. At worse she rates her pain to be a 5 out of 10 right now it's more like a 1 out of 10. 10/07/16; still the patient has 3 different wound areas. She has a deep stage IV wound over her right ischial tuberosity. She is due to have an MRI next week. The wound over her left ischial tuberosity is more superficial and underwent debridement today. Finally she has a small open area in her left popliteal fossa the probes on measurably forward superiorly. Still a lot of drainage coming out of this. The CT scan that I ordered 3 weeks ago was questioned by her insurance company wanting a plain x-ray first. As I understand things result of this is nothing has been done in 3 weeks in terms of imaging the thigh and she has an MRI booked of this along with her pelvis for next week line 10/12/16; patient has a deep probing wound over the right ischiall tuberosity, stage III wound over the left visual tuberosity and a draining sinus in her left popliteal fossa. None of this much different from when I saw this 3 weeks ago. We have been using silver rope to the right ischial wound and a draining area in the left popliteal fossa. Plain silver alginate to the area on the left ischial tuberosity 10/19/16; the patient's wounds are  essentially unchanged although the area on the left lower gluteal is actually improved. Our intake nurse noted drainage from the right initial tuberosity probing wound as well as the draining area in the left popliteal fossa. Both of these were cultured. She had x-rays I think at the insistence of her insurance company on 09/23/16 x-ray of the pelvis was not particularly helpful she did have soft tissue air over the right lower pelvis although with the depth of this wound this is not surprising. An x-ray of her left knee did not show any specific abnormalities. We are still using silver alginate to these wound areas. Her MRI is booked for 10/27 10/26/16; cultures of the purulent drainage in her right initial tuberosity wound grew moderate Proteus and few staph aureus. The same organisms were cultured out of the left knee sinus tract posteriorly. The staph aureus is MRSA. I had started her on Augmentin last week I added doxycycline. The MRI of the left lower extremity and pelvis was finally done. The MRI of the femur showed subcutaneous soft tissue swelling edema fluid and myositis in the vastus lateralis muscle but no soft tissue abscess septic arthritis or osteomyelitis. MRI of the pelvis showed the left wound to be more expensive extending down to the bone [name], [name]. ([id number]) there was osteomyelitis. Left hamstring tendons were also involved. No septic arthritis involving the hip. The decubitus ulcer on the right side showed no definite osteomyelitis or abscess.. The right hip wound is actually the one the probes 6 cm downward. But the MRI showing infection including osteomyelitis  on the left explains the draining sinus in the popliteal fossa on the left. She did have antibiotics in the hospitalization last time and this extended into her nursing home stay but I'm not exactly sure what antibiotics and for what duration. According the patient this did include vancomycin with considerable  effort of our staff we are able to get the patient into see Dr. Sampson Goon today. There were transportation difficulties. Her mother had open heart surgery and is in the ICU in [place] therefore her brother was unable to transport. Dr. Jarrett Ables office graciously arranged time to see her today. From my point of view she is going to require IV vancomycin plus perhaps a third generation cephalosporin. I plan to keep her on doxycycline and Augmentin until the IV antibiotics can be arranged. 11/02/2016 - Nautia presents today for management of ulcers; She saw Dr. 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 She is to have the PICC line placed today and receive the initial dose for both antibiotics today. She has yet to receive the offloading chair cushion and/or mattress overlay from home health, apparently this has been a 3 week process. I encouraged her to speak to home health regarding this matter, along with offering home health to contact the wouns care center with any questions or concerns. The left ischial pressure ulcer continues to imporve, while to right ischial ulcer has increased in depth. The popliteal fossa sinus tract remains unmeasurable due to the limitation of depth measurement (tract extends beyond our measuring devices). 11-16-2016 Ms. Teaney presents today for evaluation and management of bilateral ischial stage IV pressure ulcers and sinus tract to the left popliteal fossa. she is under the care of Dr. Sampson Goon for IV antibiotic therapy; she states that the vancomycin was placed on hold and will be restarted at a lower dose based on her renal function. She continues taking Zosyn in addition to the vancomycin. She also states that she has yet to receive offloading cushions from home health, according to her she does not qualify for these offloading cushions because "the ulcers are  unstageable ". We will contact the home health agency today to lend clarity regarding her pressure ulcers. The left popliteal fossa sinus tract continues to be a measurable as it extends beyond the length of our measuring devices.. 11/30/16; the patient is still on vancomycin and Zosyn. The depth of the draining sinus behind her left popliteal fossa is down to 4 cm although there is still serosanguineous drainage coming out of this. She saw Dr. Sampson Goon of infectious disease yesterday the idea is to weeks more of IV antibiotics and then oral antibiotics although I have not read his note. The area on the left gluteal fold is just about healed. She has a 6 cm draining sinus over the right initial tuberosity although I cannot feel bone at the base of this. As far as the patient is aware she has not had a recheck of her inflammatory markers. 12/07/16; patient is on vancomycin and Zosyn appointment with Dr. Sampson Goon on the 19th at which point the patient expects to have a change in antibiotics. Remarkable improvement over the wound over the left ischial tuberosity which is just about closed. The draining sinus in her popliteal fossa has 0.4 cm in depth. The area on the right ischial tuberosity still probes down 7 cm. This is closed and overall wound dimensions but not depth. 12/14/16; patient is completing her  vancomycin and Zosyn and per her she is going to transition to Bactrim and Augmentin for another 3 weeks. The area on her left gluteal fold is closed except for some skin tears. The area behind her left knee is no longer has any depth. The only remaining area that is of clinical concern is on the right gluteal fold probing towards the right ischial tuberosity. Today this measures 6.9 cm in depth. Very gritty surface 12/21/16; patient is now on Bactrim and Augmentin as directed by infectious disease. This should be for another 2 weeks. The area in her left gluteal fold and left popliteal are  closed over and fully healed. Measurements today at 7 cm in the right buttock wound is unchanged from last week. 12/27/16; patient is on Bactrim and Augmentin for another week as directed by infectious disease. She has [name], [name]. ([id number]) completed her IV antibiotics. She is not been systemically unwell no fever no chills. The area on her right buttock measured over 6 cm in depth. There is no palpable bone. No evidence of surrounding soft tissue infection. She is complaining of tongue irritation and has a history of thrush 01/04/17; patient is been back to see Dr. Sampson Goon, her Augmentin was stopped but he continued the Bactrim for another 3 weeks. Depth of the wound is 6.7 cm there is been no major change in either direction. She is not receive the wound VAC from home health I think because of confusion about who is supposed to provided will actually talk to the home health agency today [[facility] The net no major change. 01/18/17; patient obtained her wound VAC about 10 days ago however for some reason it was not actually put on the wound. She is therefore here for [place] to apply this I guess. No other issues are noted. She is not complaining of pain fever drainage 02/01/17; patient is here now having the wound VAC or 3-4 weeks to a deep pressure area over the right initial tuberosity. This measures 6 cm in depth today which is about half a centimeter better than 2 weeks ago. There is no evidence she is systemically unwell no fever no chills no pain around the area. 02/15/17; I follow this patient every 2 weeks for a deep area over the right ischial tuberosity. This measures 5.5 cm today which is a continued improvement of 1.2 cm from 1/10 and down 0.5 cm from her visit 2 weeks ago 03/01/17; continued difficult area over the right initial tuberosity using the wound VAC. Depth today of 5.1 cm which is improved. Does not appear to be a lot of drainage in the canister. Antibiotics were finally  stopped by Dr. Sampson Goon bactrim[]  and inflammatory markers have been repeated 03/15/17; fall this lady every 2 weeks for a difficult area over her lower right gluteal area/ischial tuberosity. Depth today at 4.6 cm. This is a slow but steady improvement in the depth of this wound. Although we have labeled this as a pressure ulcer there may have been an underlying infection here at one point before we saw her. She had osteomyelitis on the left extensively which is since resolved 03/29/17- patient is here for follow-up evaluation of her right ischial pressure ulcer. She continues with the wound VAC and home health. According to the nurse home health has been using less foams and appropriate and we will instruct accordingly. The patient continues to smoke, approximately 10 cigarettes a day. She has been advised to decrease that in half by her next appointment,  with a goal of complete cessation. She states her blood sugars have been consistently less than 150. She states that she spends most of her day position left lateral or prone. She does have an air mattress on her bed, she does not have an mattress for her chair, she states she cannot afford this. 04/12/17; patient is here for evaluation of her right ischial pressure ulcer. We continue to use a wound VAC with minimal improvement today the depth of this measuring 4.4 cm versus 4.6 cm 2 weeks ago. We are using a [employer] wound VAC on this area. There is not excessive drainage no pain. The patient tells me she tries to keep off this in bed but is up in the wheelchair for 2 hours a day. She is limited in no her overall ambulation but is improving and apparently is getting bilateral lower extremity braces which she hopes will improve her ability to walk independently. 05/10/17; Depth at 3.3cm. Improved 05/24/17; depth at 3.5 cm. This is not improved since last time. Not clear if they are using collagen under the foam 06/07/17; depth that 2.9 which is a  slight improvement. Still using the wound VAC. 06/21/17; depth is 2.9 cm which is exactly the same as last time. Also the appearance of this wound is completely the same. We have been using silver collagen under a wound VAC. 07/05/17 patient presents today for reevaluation concerning her right Ischial wound. She had switched insurance companies and so it does appear that the Wound VAC needs to be reauthorized which we are working on this morning. Nonetheless her wound has been saying about the same we have continued to use the silver collagen underneath the wound VAC. She has no discomfort. 07/19/17; patient follows every 2 weeks for her right if she'll wound. Since I last saw this a month ago her dimensions of come down to 2.6 cm. This is slightly down from a month ago when it was 2.9 cm in 4 months ago it was 4.6 cm. I note that there were insurance company issues with regard to the wound VAC which she apparently is not having on for several weeks now. I think it would be reasonable to change Meeuwsen, Tamari [name]. ([id number]) therapies here. Will use silver alginate rope 08/02/17 on evaluation today patient's wound actually appears to be doing significantly better in regard to the depth as compared to her previous evaluation. She is having no discomfort. Unfortunately she never got the Wound VAC reapproved following the insurance issues from several weeks ago. With that being said it does not appear that she needs to requires the Wound VAC anymore and in fact I feel that she is doing better and making greater improvements at this point without it. Fortunately she has no nausea, vomiting, diarrhea, fevers, or chills. Electronic Signature(s) Signed: 08/02/2017 6:07:57 PM By: Lenda Kelp PA-C Entered By: Lenda Kelp on 08/02/2017 08:37:18 [name], [name] -------------------------------------------------------------------------------- Physical Exam Details Patient Name: [name] Date of Service: 08/02/2017 8:00 AM Medical Record Number: [id number] Patient Account Number: [id number] Date of Birth/Sex: 04/11/1961 (56 y.o. Female) Treating RN: Ashok Cordia, Debi Primary Care Provider: Joen [name] Other Clinician: Referring Provider: Joen [name] Treating Provider/Extender: STONE III, Ammi Hutt Weeks in Treatment: 45 Constitutional Well-nourished and well-hydrated in no acute distress. Respiratory normal breathing without difficulty. Psychiatric this patient is able to make decisions and demonstrates good insight into disease process. Alert and Oriented x 3. pleasant and cooperative. Notes Patient's wound bed does have  some Slough covering and that did require sharp debridement today which he tolerated without pain or discomfort. Electronic Signature(s) Signed: 08/02/2017 6:07:57 PM By: Lenda Kelp PA-C Entered By: Lenda Kelp on 08/02/2017 08:37:42 [name], [name] ([id number]) -------------------------------------------------------------------------------- Physician Orders Details Patient Name: [name] Date of Service: 08/02/2017 8:00 AM Medical Record Number: [id number] Patient Account Number: [id number] Date of Birth/Sex: 1961-02-20 (56 y.o. Female) Treating RN: Ashok Cordia, Debi Primary Care Provider: Joen [name] Other Clinician: Referring Provider: Joen [name] Treating Provider/Extender: Linwood Dibbles, Felicita Nuncio Weeks in Treatment: 45 Verbal / Phone Orders: Yes Clinician: Ashok Cordia, Debi Read Back and Verified: Yes Diagnosis Coding Wound Cleansing Wound #3 Right Gluteal fold o Clean wound with Normal Saline. o Cleanse wound with mild soap and water Skin Barriers/Peri-Wound Care Wound #3 Right Gluteal fold o Skin Prep Primary Wound Dressing Wound #3 Right Gluteal fold o Other: - SilverCel non-adherent rope lightly pack in wound Secondary Dressing Wound #3 Right Gluteal fold o Dry Gauze o Boardered Foam Dressing Dressing Change  Frequency Wound #3 Right Gluteal fold o Three times weekly Follow-up Appointments Wound #3 Right Gluteal fold o Return Appointment in 2 weeks. Off-Loading Wound #3 Right Gluteal fold o Turn and reposition every 2 hours Additional Orders / Instructions Wound #3 Right Gluteal fold o Increase protein intake. [name], Katelyn LMarland Kitchen ([id number]) Home Health Wound #3 Right Gluteal fold o Continue Home Health Visits - [facility] Nurse may visit PRN to address patientos wound care needs. o FACE TO FACE ENCOUNTER: MEDICARE and MEDICAID PATIENTS: I certify that this patient is under my care and that I had a face-to-face encounter that meets the physician face-to-face encounter requirements with this patient on this date. The encounter with the patient was in whole or in part for the following MEDICAL CONDITION: (primary reason for Home Healthcare) MEDICAL NECESSITY: I certify, that based on my findings, NURSING services are a medically necessary home health service. HOME BOUND STATUS: I certify that my clinical findings support that this patient is homebound (i.e., Due to illness or injury, pt requires aid of supportive devices such as crutches, cane, wheelchairs, walkers, the use of special transportation or the assistance of another person to leave their place of residence. There is a normal inability to leave the home and doing so requires considerable and taxing effort. Other absences are for medical reasons / religious services and are infrequent or of short duration when for other reasons). o If current dressing causes regression in wound condition, may D/C ordered dressing product/s and apply Normal Saline Moist Dressing daily until next Wound Healing Center / Other MD appointment. Notify Wound Healing Center of regression in wound condition at [phone]. o Please direct any NON-WOUND related issues/requests for orders to patient's Primary  Care Physician Notes At this point will continue with the above wound care orders for the next week. If anything changes she will contact the office for additional recommendations. Otherwise hopefully she will continue to note good improvement in regard to her measurements in the coming weeks. Electronic Signature(s) Signed: 08/02/2017 6:07:57 PM By: Lenda Kelp PA-C Previous Signature: 08/02/2017 8:27:10 AM Version By: Lenda Kelp PA-C Entered By: Lenda Kelp on 08/02/2017 08:38:25 [name], [name] ([id number]) -------------------------------------------------------------------------------- Problem List Details Patient Name: [name] Date of Service: 08/02/2017 8:00 AM Medical Record Number: [id number] Patient Account Number: [id number] Date of Birth/Sex: 09/07/1961 (56 y.o. 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 ([id number]) [name], [name] -------------------------------------------------------------------------------- Progress  Note Details Patient Name: [name] Date of Service: 08/02/2017 8:00 AM Medical Record Number: [id number] Patient Account Number: [id number] Date of Birth/Sex: [birth year] (56 y.o. Female) Treating RN: Ashok Cordia, Debi Primary Care Provider: Joen [name] Other Clinician: Referring Provider: Joen [name] Treating Provider/Extender: Linwood Dibbles, Hadassah Rana Weeks in Treatment: 45 Subjective Chief Complaint Information obtained from Patient Patient is here for follow-up of a relation of her right ischial pressure ulcer History of Present Illness (HPI) The following HPI elements were documented for the patient's wound: Location: bilateral gluteal ulcerations and left popliteal ulceration with tunneling Quality: adits to intermittent aching to ischial ulcers, no discomfort to sinus tract Severity: right iscial ulcer with increased depth Duration: chronic ulcers to bilateral ischial ulcers and sinus tract Timing: The pain is intermittent in severity as far as how intense it becomes but is present all the time. Manipulationn makes this worse. Context: The wound occurred when the patient had a fall and was unconscious for about 48 hours laying on the floor and she had pressure injury at that stage. Modifying Factors: Other treatment(s) tried include:as noted below she has been seen by visiting wound care physicians or nurse practitioners and details have been noted Associated Signs and Symptoms: has yet to receive offloading cushions and mattress overlay 56 year old patient who was seen by visiting [name] wound care specialist for a wound on both her buttock and was found to have an unstageable wound on the right buttock for about 2 months. I understand that she had a fall and was laying on the floor for about 48 hours before she was found and taken to the ICU and had a long injury to her gluteal area from pressure and also had broken her right humerus. She has had a right proximal humerus fracture and  has been followed up with orthopedics recently. The patient has a past medical history of type 2 diabetes mellitus, paraparesis, acute pyelonephritis, GERD, hypertension, glaucoma, chronic pain, anxiety neurosis, nicotine dependence, COPD. the patient had some debridement done and was to operative was recommended to use Silvadene dressing and offloading. She is a smoker and occasionally smokes a few cigarettes. the patient requested a second opinion for months and is here to discuss her care. 09/21/16; the patient re-presents from home today for review of 3 different wounds. I note that she was seen in the clinic here in July at which time she had bilateral buttock wounds. It was apparently suggested at that time that she use a wound VAC bridged to both wounds just near the initial tuberosity's bilaterally which she refused. The history was a bit difficult to put together. Apparently this patient became ill at the end of April of this year. She was found sitting on the floor she had  apparently been for 2 days and subsequently admitted to hospital from 04/23/16 through 05/02/16 and at that point she was critically ill ultimately having sepsis Dohmen, Julanne [name]. ([id number]) secondary to UTI, nontraumatic rhabdomyolysis and diabetic ketoacidosis. She had acute renal failure and I think required ICU care including intubation. Patient states her wounds actually started at that point on the bilateral issue tuberosities however in reviewing the discharge summary from 5/8 I see no reference to wounds at that point. It did state that she had left lower extremity cellulitis however. Reviewing Epic I see no relevant x-rays. It would appear that her discharge creatinine was within the normal range and indeed on 9/15 her creatinine has remained normal. [facility] There the wounds on her bilateral Buttocks were dressed. Only just before her discharge  from the nursing facility she developed an "knot" which was interpreted as cellulitis on the posterior aspect of her left knee she was given antibiotics. Apparently sometime late in July a this actually opened and became a wound at home health care was tending to however she is still having purulent drainage coming from this and by my understanding the wound depth is actually become unmeasurable. I am not really clear about what home health has been placing in any of these wound areas. The patient states that is something with silver and it. She is not been systemically unwell no fever or chills her appetite is good. She is a diabetic poorly controlled however she states that her recent blood sugars at home have been in the low to mid 100s. 09/28/16 On evaluation today patient appears to continue to exhibit the 3 areas of ulceration that were noted previous. She did have an x-ray of the right pelvis which showed evidence of potential soft tissue infection but no obvious osteomyelitis. There was a discussion last office visit concerning the possibility of a wound VAC. Witth that being said the x-ray report suggested that an MRI may be more appropriate to further evaluate the extent. Subsequently in regard to the wound over the popliteal portion of the left lower extremity with tunneling at 12:00 the CT scan that was ordered was denied by insurance as they state the patient has not had x-rays prior to advanced imaging. Patient states that she is frustrated with the situation overall. 10/05/16 in the interval since I last saw this patient last week she has had the x-ray of the knee performed. I did review that x-ray today and fortunately shows no evidence of osteomyelitis or other acute abnormality at this point in time. She continues to have the opening iin the posterior left popliteal space with tracking proximal up the posterior thigh. Nothing seems to have worsened but it also seems to have not  improved. The same is true in regard to the right pressure ulcer over the gluteal region which extends toward the ischium. The left gluteal pressure ulcer actually appears to be doing somewhat better my opinion there is some necrotic slough but overall this appears fairly well. She tells me thatt she has some discomfort especially when home health is helping her with dressing changes as they do not know her. At worse she rates her pain to be a 5 out of 10 right now it's more like a 1 out of 10. 10/07/16; still the patient has 3 different wound areas. She has a deep stage IV wound over her right ischial tuberosity. She is due to have an MRI next  week. The wound over her left ischial tuberosity is more superficial and underwent debridement today. Finally she has a small open area in her left popliteal fossa the probes on measurably forward superiorly. Still a lot of drainage coming out of this. The CT scan that I ordered 3 weeks ago was questioned by her insurance company wanting a plain x-ray first. As I understand things result of this is nothing has been done in 3 weeks in terms of imaging the thigh and she has an MRI booked of this along with her pelvis for next week line 10/12/16; patient has a deep probing wound over the right ischiall tuberosity, stage III wound over the left visual tuberosity and a draining sinus in her left popliteal fossa. None of this much different from when I saw this 3 weeks ago. We have been using silver rope to the right ischial wound and a draining area in the left popliteal fossa. Plain silver alginate to the area on the left ischial tuberosity 10/19/16; the patient's wounds are essentially unchanged although the area on the left lower gluteal is actually improved. Our intake nurse noted drainage from the right initial tuberosity probing wound as well as the draining area in the left popliteal fossa. Both of these were cultured. She had x-rays I think at  the insistence of her insurance company on 09/23/16 x-ray of the pelvis was not particularly helpful she did have soft tissue air over the right lower pelvis although with the depth of this wound this is not surprising. An x-ray Santistevan, Micca [name]. ([id number]) of her left knee did not show any specific abnormalities. We are still using silver alginate to these wound areas. Her MRI is booked for 10/27 10/26/16; cultures of the purulent drainage in her right initial tuberosity wound grew moderate Proteus and few staph aureus. The same organisms were cultured out of the left knee sinus tract posteriorly. The staph aureus is MRSA. I had started her on Augmentin last week I added doxycycline. The MRI of the left lower extremity and pelvis was finally done. The MRI of the femur showed subcutaneous soft tissue swelling edema fluid and myositis in the vastus lateralis muscle but no soft tissue abscess septic arthritis or osteomyelitis. MRI of the pelvis showed the left wound to be more expensive extending down to the bone there was osteomyelitis. Left hamstring tendons were also involved. No septic arthritis involving the hip. The decubitus ulcer on the right side showed no definite osteomyelitis or abscess.. The right hip wound is actually the one the probes 6 cm downward. But the MRI showing infection including osteomyelitis on the left explains the draining sinus in the popliteal fossa on the left. She did have antibiotics in the hospitalization last time and this extended into her nursing home stay but I'm not exactly sure what antibiotics and for what duration. According the patient this did include vancomycin with considerable effort of our staff we are able to get the patient into see Dr. Sampson Goon today. There were transportation difficulties. Her mother had open heart surgery and is in the ICU in [place] therefore her brother was unable to transport. Dr. Jarrett Ables office graciously arranged time  to see her today. From my point of view she is going to require IV vancomycin plus perhaps a third generation cephalosporin. I plan to keep her on doxycycline and Augmentin until the IV antibiotics can be arranged. 11/02/2016 - Tahirah presents today for management of ulcers; She saw Dr. 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 She is to have the PICC line placed today and receive the initial dose for both antibiotics today. She has yet to receive the offloading chair cushion and/or mattress overlay from home health, apparently this has been a 3 week process. I encouraged her to speak to home health regarding this matter, along with offering home health to contact the wouns care center with any questions or concerns. The left ischial pressure ulcer continues to imporve, while to right ischial ulcer has increased in depth. The popliteal fossa sinus tract remains unmeasurable due to the limitation of depth measurement (tract extends beyond our measuring devices). 11-16-2016 Ms. Sant presents today for evaluation and management of bilateral ischial stage IV pressure ulcers and sinus tract to the left popliteal fossa. she is under the care of Dr. Sampson Goon for IV antibiotic therapy; she states that the vancomycin was placed on hold and will be restarted at a lower dose based on her renal function. She continues taking Zosyn in addition to the vancomycin. She also states that she has yet to receive offloading cushions from home health, according to her she does not qualify for these offloading cushions because "the ulcers are unstageable ". We will contact the home health agency today to lend clarity regarding her pressure ulcers. The left popliteal fossa sinus tract continues to be a measurable as it extends beyond the length of our measuring devices.. 11/30/16; the patient is still on vancomycin and Zosyn.  The depth of the draining sinus behind her left popliteal fossa is down to 4 cm although there is still serosanguineous drainage coming out of this. She saw Dr. Sampson Goon of infectious disease yesterday the idea is to weeks more of IV antibiotics and then oral antibiotics although I have not read his note. The area on the left gluteal fold is just about healed. She has a 6 cm draining sinus over the right initial tuberosity although I cannot feel bone at the base of this. As far as the patient is aware she has not had a recheck of her inflammatory markers. 12/07/16; patient is on vancomycin and Zosyn appointment with Dr. Sampson Goon on the 19th at which point the patient expects to have a change in antibiotics. Remarkable improvement over the wound over the left ischial tuberosity which is just about closed. The draining sinus in her popliteal fossa has 0.4 cm in depth. The area on the right ischial tuberosity still probes down 7 cm. This is closed and overall wound dimensions but not depth. 12/14/16; patient is completing her vancomycin and Zosyn and per her she is going to transition to Bactrim [name], [name]. ([id number]) and Augmentin for another 3 weeks. The area on her left gluteal fold is closed except for some skin tears. The area behind her left knee is no longer has any depth. The only remaining area that is of clinical concern is on the right gluteal fold probing towards the right ischial tuberosity. Today this measures 6.9 cm in depth. Very gritty surface 12/21/16; patient is now on Bactrim and Augmentin as directed by infectious disease. This should be for another 2 weeks. The area in her left gluteal fold and left popliteal are closed over and fully healed. Measurements today at 7 cm in the right buttock wound is unchanged from last week. 12/27/16; patient is on Bactrim and Augmentin for another week as directed by infectious disease. She has completed her IV antibiotics.  She is not  been systemically unwell no fever no chills. The area on her right buttock measured over 6 cm in depth. There is no palpable bone. No evidence of surrounding soft tissue infection. She is complaining of tongue irritation and has a history of thrush 01/04/17; patient is been back to see Dr. Sampson Goon, her Augmentin was stopped but he continued the Bactrim for another 3 weeks. Depth of the wound is 6.7 cm there is been no major change in either direction. She is not receive the wound VAC from home health I think because of confusion about who is supposed to provided will actually talk to the home health agency today [[facility] The net no major change. 01/18/17; patient obtained her wound VAC about 10 days ago however for some reason it was not actually put on the wound. She is therefore here for [place] to apply this I guess. No other issues are noted. She is not complaining of pain fever drainage 02/01/17; patient is here now having the wound VAC or 3-4 weeks to a deep pressure area over the right initial tuberosity. This measures 6 cm in depth today which is about half a centimeter better than 2 weeks ago. There is no evidence she is systemically unwell no fever no chills no pain around the area. 02/15/17; I follow this patient every 2 weeks for a deep area over the right ischial tuberosity. This measures 5.5 cm today which is a continued improvement of 1.2 cm from 1/10 and down 0.5 cm from her visit 2 weeks ago 03/01/17; continued difficult area over the right initial tuberosity using the wound VAC. Depth today of 5.1 cm which is improved. Does not appear to be a lot of drainage in the canister. Antibiotics were finally stopped by Dr. Sampson Goon bactrim[]  and inflammatory markers have been repeated 03/15/17; fall this lady every 2 weeks for a difficult area over her lower right gluteal area/ischial tuberosity. Depth today at 4.6 cm. This is a slow but steady improvement in the depth of this wound. Although  we have labeled this as a pressure ulcer there may have been an underlying infection here at one point before we saw her. She had osteomyelitis on the left extensively which is since resolved 03/29/17- patient is here for follow-up evaluation of her right ischial pressure ulcer. She continues with the wound VAC and home health. According to the nurse home health has been using less foams and appropriate and we will instruct accordingly. The patient continues to smoke, approximately 10 cigarettes a day. She has been advised to decrease that in half by her next appointment, with a goal of complete cessation. She states her blood sugars have been consistently less than 150. She states that she spends most of her day position left lateral or prone. She does have an air mattress on her bed, she does not have an mattress for her chair, she states she cannot afford this. 04/12/17; patient is here for evaluation of her right ischial pressure ulcer. We continue to use a wound VAC with minimal improvement today the depth of this measuring 4.4 cm versus 4.6 cm 2 weeks ago. We are using a [employer] wound VAC on this area. There is not excessive drainage no pain. The patient tells me she tries to keep off this in bed but is up in the wheelchair for 2 hours a day. She is limited in no her overall ambulation but is improving and apparently is getting bilateral lower extremity braces which she  hopes will improve her ability to walk independently. 05/10/17; Depth at 3.3cm. Improved 05/24/17; depth at 3.5 cm. This is not improved since last time. Not clear if they are using collagen under the foam 06/07/17; depth that 2.9 which is a slight improvement. Still using the wound VAC. 06/21/17; depth is 2.9 cm which is exactly the same as last time. Also the appearance of this wound is completely the same. We have been using silver collagen under a wound VAC. [name], [name] 07/05/17 patient presents today for  reevaluation concerning her right Ischial wound. She had switched insurance companies and so it does appear that the Wound VAC needs to be reauthorized which we are working on this morning. Nonetheless her wound has been saying about the same we have continued to use the silver collagen underneath the wound VAC. She has no discomfort. 07/19/17; patient follows every 2 weeks for her right if she'll wound. Since I last saw this a month ago her dimensions of come down to 2.6 cm. This is slightly down from a month ago when it was 2.9 cm in 4 months ago it was 4.6 cm. I note that there were insurance company issues with regard to the wound VAC which she apparently is not having on for several weeks now. I think it would be reasonable to change therapies here. Will use silver alginate rope 08/02/17 on evaluation today patient's wound actually appears to be doing significantly better in regard to the depth as compared to her previous evaluation. She is having no discomfort. Unfortunately she never got the Wound VAC reapproved following the insurance issues from several weeks ago. With that being said it does not appear that she needs to requires the Wound VAC anymore and in fact I feel that she is doing better and making greater improvements at this point without it. Fortunately she has no nausea, vomiting, diarrhea, fevers, or chills. Objective Constitutional Well-nourished and well-hydrated in no acute distress. Vitals Time Taken: 8:13 AM, Height: 63 in, Weight: 257 lbs, BMI: 45.5, Temperature: 98.0 F, Pulse: 71 bpm, Respiratory Rate: 18 breaths/min, Blood Pressure: 128/59 mmHg. Respiratory normal breathing without difficulty. Psychiatric this patient is able to make decisions and demonstrates good insight into disease process. Alert and Oriented x 3. pleasant and cooperative. General Notes: Patient's wound bed does have some Slough covering and that did require sharp debridement today which he  tolerated without pain or discomfort. Integumentary (Hair, Skin) Wound #3 status is Open. Original cause of wound was Pressure Injury. The wound is located on the Right Gluteal fold. The wound measures 0.9cm length x 1.2cm width x 2cm depth; 0.848cm^2 area and 1.696cm^3 volume. There is Fat Layer (Subcutaneous Tissue) Exposed exposed. There is no tunneling or undermining noted. There is a large amount of serous drainage noted. The wound margin is distinct with the outline attached to the wound base. There is medium (34-66%) red granulation within the wound bed. There Farrior, Ahmiyah [name]. ([id number]) is a medium (34-66%) amount of necrotic tissue within the wound bed including Adherent Slough. The periwound skin appearance exhibited: Scarring, Maceration. The periwound skin appearance did not exhibit: Callus, Crepitus, Excoriation, Induration, Rash, Dry/Scaly, Atrophie Blanche, Cyanosis, Ecchymosis, Hemosiderin Staining, Mottled, Pallor, Rubor, Erythema. Periwound temperature was noted as No Abnormality. The periwound has tenderness on palpation. Assessment Active Problems ICD-10 L89.314 - Pressure ulcer of right buttock, stage 4 E11.42 - Type 2 diabetes mellitus with diabetic polyneuropathy Procedures Wound #3 Pre-procedure diagnosis of Wound #3 is a Pressure  Ulcer located on the Right Gluteal fold . There was a Skin/Subcutaneous Tissue Debridement ([id number]) debridement with total area of 1.08 sq cm performed by STONE III, Burwell Bethel E., PA-C. with the following instrument(s): Curette to remove Viable and Non-Viable tissue/material including Exudate, Fibrin/Slough, and Subcutaneous after achieving pain control using Lidocaine 4% Topical Solution. A time out was conducted at 08:28, prior to the start of the procedure. A Minimum amount of bleeding was controlled with Pressure. The procedure was tolerated well with a pain level of 0 throughout and a pain level of 0 following the procedure. Post  Debridement Measurements: 0.9cm length x 1.2cm width x 2cm depth; 1.696cm^3 volume. Post debridement Stage noted as Category/Stage IV. Character of Wound/Ulcer Post Debridement requires further debridement. Post procedure Diagnosis Wound #3: Same as Pre-Procedure Plan Wound Cleansing: Wound #3 Right Gluteal fold: Clean wound with Normal Saline. Cleanse wound with mild soap and water Skin Barriers/Peri-Wound Care: JODIE, CAVEY ([id number]) Wound #3 Right Gluteal fold: Skin Prep Primary Wound Dressing: Wound #3 Right Gluteal fold: Other: - SilverCel non-adherent rope lightly pack in wound Secondary Dressing: Wound #3 Right Gluteal fold: Dry Gauze Boardered Foam Dressing Dressing Change Frequency: Wound #3 Right Gluteal fold: Three times weekly Follow-up Appointments: Wound #3 Right Gluteal fold: Return Appointment in 2 weeks. Off-Loading: Wound #3 Right Gluteal fold: Turn and reposition every 2 hours Additional Orders / Instructions: Wound #3 Right Gluteal fold: Increase protein intake. Home Health: Wound #3 Right Gluteal fold: Continue Home Health Visits - [facility] Nurse may visit PRN to address patient s wound care needs. FACE TO FACE ENCOUNTER: MEDICARE and MEDICAID PATIENTS: I certify that this patient is under my care and that I had a face-to-face encounter that meets the physician face-to-face encounter requirements with this patient on this date. The encounter with the patient was in whole or in part for the following MEDICAL CONDITION: (primary reason for Home Healthcare) MEDICAL NECESSITY: I certify, that based on my findings, NURSING services are a medically necessary home health service. HOME BOUND STATUS: I certify that my clinical findings support that this patient is homebound (i.e., Due to illness or injury, pt requires aid of supportive devices such as crutches, cane, wheelchairs, walkers, the use of special transportation or the  assistance of another person to leave their place of residence. There is a normal inability to leave the home and doing so requires considerable and taxing effort. Other absences are for medical reasons / religious services and are infrequent or of short duration when for other reasons). If current dressing causes regression in wound condition, may D/C ordered dressing product/s and apply Normal Saline Moist Dressing daily until next Wound Healing Center / Other MD appointment. Notify Wound Healing Center of regression in wound condition at [phone]. Please direct any NON-WOUND related issues/requests for orders to patient's Primary Care Physician General Notes: At this point will continue with the above wound care orders for the next week. If anything changes she will contact the office for additional recommendations. Otherwise hopefully she will continue to note good improvement in regard to her measurements in the coming weeks. Electronic Signature(s) PALESTINE, MOSCO ([id number]) Signed: 08/02/2017 6:07:57 PM By: Lenda Kelp PA-C Entered By: Lenda Kelp on 08/02/2017 08:38:33 Garduno, [name] ([id number]) -------------------------------------------------------------------------------- SuperBill Details Patient Name: [name] Date of Service: 08/02/2017 Medical Record Number: [id number] Patient Account Number: [id number] Date of Birth/Sex: [birth year] (56 y.o. 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

## 2017-08-07 ENCOUNTER — Encounter (INDEPENDENT_AMBULATORY_CARE_PROVIDER_SITE_OTHER): Payer: BLUE CROSS/BLUE SHIELD | Admitting: Dietician

## 2017-08-07 ENCOUNTER — Encounter: Payer: Self-pay | Admitting: Dietician

## 2017-08-07 VITALS — Ht 63.0 in | Wt 261.0 lb

## 2017-08-07 DIAGNOSIS — E1122 Type 2 diabetes mellitus with diabetic chronic kidney disease: Secondary | ICD-10-CM | POA: Diagnosis not present

## 2017-08-07 DIAGNOSIS — Z794 Long term (current) use of insulin: Secondary | ICD-10-CM | POA: Diagnosis not present

## 2017-08-07 DIAGNOSIS — N183 Chronic kidney disease, stage 3 unspecified: Secondary | ICD-10-CM

## 2017-08-07 NOTE — Progress Notes (Signed)
Medical Nutrition Therapy: Visit start time: 0930  end time: 1030  Assessment:  Diagnosis: diabetes with complications, recent ketoacidosis Past medical history: HTN, GERD Psychosocial issues/ stress concerns: recently lost friend  Preferred learning method:  . Auditory . Visual . Hands-on   Current weight: 261lbs (MD office) Height: 5'3" Medications, supplements: reconciled list in medical record  Progress and evaluation: Patient reports severe renal infection of which she was not aware, until relatives found her confused and unable to stand on her own. She states her BG was over 780mg /dl at hospital. She spent 12 days in ICU followed by 4 months at rehab facility. She is using a wheelchair for ambulation, unable to stand to scale to check weight. She reports that she had not been testing BGs or adhering to healthy diet prior to her hospitalization, but has now been working on diet changes to decrease carbohydrate intake and eat on a more consistent schedule.  Physical activity: none due to disability  Dietary Intake:  Usual eating pattern includes 3 meals and 2-3 snacks per day. Dining out frequency: 1 meals per week.  Breakfast: small Hawaiian bread roll with 1 sausage patty Snack: banana Lunch: usually more like a midday snack, i.e.pretzels Snack: Nabs cracker with cream cheese and chives, or homemade peanut butter crackers Supper: meat and 1 vegetable, or meat and 1 starch; 08/05/17 had roast beef with au gratin potatoes Snack: sometimes B[place]ers ice cream or homemade milkshake Beverages: diet drinks, lemonade  Nutrition Care Education: Topics covered: diabetes Basic nutrition: basic food groups, appropriate nutrient balance, appropriate meal and snack schedule, general nutrition guidelines    Weight control: benefits of weight control, portion control Advanced nutrition: food label reading Diabetes: appropriate meal and snack schedule, appropriate carb intake and balance,  importance of low sugar and low fat meals/ food choices, importance of protein source with all meals, working on changes gradually and allowing for adjustment to taste/ textures when making changes.   Nutritional Diagnosis:  [place]-2.2 Altered nutrition-related laboratory As related to diabetes, kidney disease.  As evidenced by lab report. [place]-3.3 Overweight/obesity As related to medication, excess calories, inactivity.  As evidenced by BMI 46.  Intervention: Instruction as noted above.   Set goals with direction from patient.   Commended patient for changes made thus far.    Patient declined follow-up at this time, but will schedule later if needed.  Education Materials given:  . General diet guidelines for Diabetes . Plate Planner  Quick and Healthy Meal Ideas . Diabetes and You 10/05/17) . Carbohydrate Counting and Meal Planning ([employer]) . Goals/ instructions  Learner/ who was taught:  . Patient   Level of understanding: [employer] Verbalizes/ demonstrates competency  Demonstrated degree of understanding via:   Teach back Learning barriers: . None  Willingness to learn/ readiness for change: . Eager, change in progress  Monitoring and Evaluation:  Dietary intake, exercise, BG control, kidney function, and body weight      follow up: prn

## 2017-08-07 NOTE — Patient Instructions (Signed)
   Eat something every 3-5 hours during the day.   Include a protein source with every meal.   Use food lists and menus for options for balanced meals.  Rinse canned vegetables to decrease sodium and salty taste.   Portion out snacks and avoid eating from a large containers. Try using a wafer cone to portion out ice cream.

## 2017-08-11 DIAGNOSIS — G608 Other hereditary and idiopathic neuropathies: Secondary | ICD-10-CM | POA: Insufficient documentation

## 2017-08-14 ENCOUNTER — Ambulatory Visit: Payer: BLUE CROSS/BLUE SHIELD | Admitting: Pain Medicine

## 2017-08-15 ENCOUNTER — Encounter: Payer: Self-pay | Admitting: Nurse Practitioner

## 2017-08-15 ENCOUNTER — Ambulatory Visit: Payer: BLUE CROSS/BLUE SHIELD | Attending: Pain Medicine | Admitting: Nurse Practitioner

## 2017-08-15 VITALS — BP 126/56 | HR 78 | Temp 98.1°F | Resp 16 | Ht 63.0 in | Wt 261.0 lb

## 2017-08-15 DIAGNOSIS — F429 Obsessive-compulsive disorder, unspecified: Secondary | ICD-10-CM | POA: Diagnosis not present

## 2017-08-15 DIAGNOSIS — G8220 Paraplegia, unspecified: Secondary | ICD-10-CM | POA: Diagnosis not present

## 2017-08-15 DIAGNOSIS — F209 Schizophrenia, unspecified: Secondary | ICD-10-CM | POA: Diagnosis not present

## 2017-08-15 DIAGNOSIS — F329 Major depressive disorder, single episode, unspecified: Secondary | ICD-10-CM | POA: Insufficient documentation

## 2017-08-15 DIAGNOSIS — Z8673 Personal history of transient ischemic attack (TIA), and cerebral infarction without residual deficits: Secondary | ICD-10-CM | POA: Insufficient documentation

## 2017-08-15 DIAGNOSIS — H409 Unspecified glaucoma: Secondary | ICD-10-CM | POA: Diagnosis not present

## 2017-08-15 DIAGNOSIS — F17210 Nicotine dependence, cigarettes, uncomplicated: Secondary | ICD-10-CM | POA: Diagnosis not present

## 2017-08-15 DIAGNOSIS — K219 Gastro-esophageal reflux disease without esophagitis: Secondary | ICD-10-CM | POA: Insufficient documentation

## 2017-08-15 DIAGNOSIS — M79604 Pain in right leg: Secondary | ICD-10-CM | POA: Insufficient documentation

## 2017-08-15 DIAGNOSIS — Z794 Long term (current) use of insulin: Secondary | ICD-10-CM | POA: Diagnosis not present

## 2017-08-15 DIAGNOSIS — Z833 Family history of diabetes mellitus: Secondary | ICD-10-CM | POA: Insufficient documentation

## 2017-08-15 DIAGNOSIS — N183 Chronic kidney disease, stage 3 (moderate): Secondary | ICD-10-CM | POA: Insufficient documentation

## 2017-08-15 DIAGNOSIS — M1990 Unspecified osteoarthritis, unspecified site: Secondary | ICD-10-CM | POA: Diagnosis not present

## 2017-08-15 DIAGNOSIS — G5603 Carpal tunnel syndrome, bilateral upper limbs: Secondary | ICD-10-CM | POA: Insufficient documentation

## 2017-08-15 DIAGNOSIS — M79674 Pain in right toe(s): Secondary | ICD-10-CM | POA: Insufficient documentation

## 2017-08-15 DIAGNOSIS — G894 Chronic pain syndrome: Secondary | ICD-10-CM | POA: Insufficient documentation

## 2017-08-15 DIAGNOSIS — M79671 Pain in right foot: Secondary | ICD-10-CM | POA: Insufficient documentation

## 2017-08-15 DIAGNOSIS — Z6841 Body Mass Index (BMI) 40.0 and over, adult: Secondary | ICD-10-CM | POA: Diagnosis not present

## 2017-08-15 DIAGNOSIS — Z823 Family history of stroke: Secondary | ICD-10-CM | POA: Insufficient documentation

## 2017-08-15 DIAGNOSIS — F419 Anxiety disorder, unspecified: Secondary | ICD-10-CM | POA: Diagnosis not present

## 2017-08-15 DIAGNOSIS — G8929 Other chronic pain: Secondary | ICD-10-CM | POA: Diagnosis not present

## 2017-08-15 DIAGNOSIS — Z79891 Long term (current) use of opiate analgesic: Secondary | ICD-10-CM | POA: Insufficient documentation

## 2017-08-15 DIAGNOSIS — Z881 Allergy status to other antibiotic agents status: Secondary | ICD-10-CM | POA: Insufficient documentation

## 2017-08-15 DIAGNOSIS — E1142 Type 2 diabetes mellitus with diabetic polyneuropathy: Secondary | ICD-10-CM | POA: Insufficient documentation

## 2017-08-15 DIAGNOSIS — M79675 Pain in left toe(s): Secondary | ICD-10-CM | POA: Diagnosis not present

## 2017-08-15 DIAGNOSIS — E1122 Type 2 diabetes mellitus with diabetic chronic kidney disease: Secondary | ICD-10-CM | POA: Insufficient documentation

## 2017-08-15 DIAGNOSIS — E785 Hyperlipidemia, unspecified: Secondary | ICD-10-CM | POA: Diagnosis not present

## 2017-08-15 DIAGNOSIS — M79605 Pain in left leg: Secondary | ICD-10-CM | POA: Diagnosis not present

## 2017-08-15 DIAGNOSIS — Z8249 Family history of ischemic heart disease and other diseases of the circulatory system: Secondary | ICD-10-CM | POA: Insufficient documentation

## 2017-08-15 DIAGNOSIS — I129 Hypertensive chronic kidney disease with stage 1 through stage 4 chronic kidney disease, or unspecified chronic kidney disease: Secondary | ICD-10-CM | POA: Diagnosis not present

## 2017-08-15 DIAGNOSIS — Z9889 Other specified postprocedural states: Secondary | ICD-10-CM | POA: Insufficient documentation

## 2017-08-15 NOTE — Progress Notes (Signed)
Patient's Name: [name]  MRN: [id number]  Referring Provider: Lynnell Jude, MD  DOB: [birth year]  PCP: Katelyn Jude, MD  DOS: 08/15/2017  Note by: Katelyn Francois NP  Service setting: Ambulatory outpatient  Specialty: Interventional Pain Management  Location: [facility] (AMB) Pain Management Facility    Patient type: Established    Primary Reason(s) for Visit: Encounter for prescription drug management & post-procedure evaluation of chronic illness with mild to moderate exacerbation(Level of risk: moderate) CC: No chief complaint on file.  HPI  [name] is a 56 y.o. year old, female patient, who comes today for a post-procedure evaluation and medication management. 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[place]); Closed fracture of humerus, surgical neck; Kidney lump; Diabetes mellitus ([place]); Bilateral edema of lower extremity; Bilateral leg weakness; Closed 3-part fracture of surgical neck of right humerus with delayed healing; Infected decubitus ulcer, unstageable ([place]); Kidney mass; Other specified diabetes mellitus with ketoacidosis without coma ([place]); Chronic feet pain (Location of Primary Source of Pain) (Bilateral) (R>L); Diabetic peripheral neuropathy (HCC) (Location of Primary Source of Pain) (Bilateral) (R>L); Chronic lower extremity pain (Location of Secondary source of pain) (Bilateral) (R>L); Chronic pain syndrome; Long term (current) use of opiate analgesic; Long term prescription opiate use; Opiate use; Chronic hand pain (Location of Tertiary source of pain) (Bilateral) (L>R); Carpal tunnel syndrome (Bilateral) (L>R); History of stroke; CKD (chronic kidney disease) stage 3, GFR 30-59 ml/min; Class 3 drug-induced obesity with serious comorbidity and body mass index (BMI) of 45.0 to 49.9 in adult [facility]); and Mixed sensory-motor polyneuropathy on her problem list. Her primarily concern today is the No chief complaint on  file.  Pain Assessment: Location: Right, Left   Radiating: na Onset: More than a month ago Duration: Chronic pain Quality: Burning, Tingling Severity: 7 /10 (self-reported pain score)  Note: Reported level is compatible with observation.                   Effect on ADL: cant walk long distances Timing: Constant Modifying factors: medications  [name] was last seen on 07/25/2017 for a procedure. During today's appointment we reviewed [name]'s post-procedure results, as well as her outpatient medication regimen. She states that she was not able to see any difference. She states that she did not get any relief after the 4 hours after the procedure. She admits that 6-7 hour after this she was having burning; "Satan was in her feet". She has failed Lyrica. She is using the Gabapentin as directed.  Further details on both, my assessment(s), as well as the proposed treatment plan, please see below.  Controlled Substance Pharmacotherapy Assessment REMS (Risk Evaluation and Mitigation Strategy)  Analgesic: current opioid by primary care provider (oxycodone/acetaminophen10/384m 3 times daily) MME/day:446mday.  GaIgnatius SpeckingRN  08/15/2017  8:19 AM  Sign at close encounter Safety precautions to be maintained throughout the outpatient stay will include: orient to surroundings, keep bed in low position, maintain call bell within reach at all times, provide assistance with transfer out of bed and ambulation.    Pharmacokinetics: Liberation and absorption (onset of action): WNL Distribution (time to peak effect): WNL Metabolism and excretion (duration of action): WNL         Pharmacodynamics: Desired effects: Analgesia: [name] >50% benefit. Functional ability: Patient reports that medication allows her to accomplish basic ADLs Clinically meaningful improvement in function (CMIF): Sustained CMIF goals met Perceived effectiveness: Described  as relatively effective, allowing for  increase in activities of daily living (ADL) Undesirable effects: Side-effects or Adverse reactions: None reported Monitoring: [place] PMP: Online review of the past 66-monthperiod conducted. Compliant with practice rules and regulations List of all UDS test(s) done:  No results found for: TOXASSSELUR, SUMMARY Last UDS on record: No results found for: TOXASSSELUR, SUMMARY UDS interpretation: Compliant          Medication Assessment Form: Reviewed. Patient indicates being compliant with therapy Treatment compliance: Compliant Risk Assessment Profile: Aberrant behavior: See prior evaluations. None observed or detected today Comorbid factors increasing risk of overdose: See prior notes. No additional risks detected today Risk of substance use disorder (SUD): Low     Opioid Risk Tool - 08/15/17 0824      Family History of Substance Abuse   Alcohol Positive Female   Illegal Drugs Negative   Rx Drugs Negative     Personal History of Substance Abuse   Alcohol Negative   Illegal Drugs Negative   Rx Drugs Negative     Psychological Disease   Psychological Disease Positive   ADD Negative   OCD Negative   Bipolar Negative   Schizophrenia Negative   Depression Negative     Total Score   Opioid Risk Tool Scoring 3   Opioid Risk Interpretation Low Risk     ORT Scoring interpretation table:  Score <3 = Low Risk for SUD  Score between 4-7 = Moderate Risk for SUD  Score >8 = High Risk for Opioid Abuse   Risk Mitigation Strategies:  Patient Counseling: Covered Patient-Prescriber Agreement (PPA): Present and active  Notification to other healthcare providers: Done  Pharmacologic Plan: No change in therapy, at this time  Post-Procedure Assessment  Visit date not found Procedure: 07/25/2017 Pre-procedure pain score:  7/10 Post-procedure pain score: 0/10 (100% relief) Influential Factors: BMI: 46.23 kg/m Intra-procedural challenges: None observed.         Assessment challenges:  None detected.              Reported side-effects: None.        Post-procedural adverse reactions or complications: None reported         Sedation: Please see nurses note. When no sedatives are used, the analgesic levels obtained are directly associated to the effectiveness of the local anesthetics. However, when sedation is provided, the level of analgesia obtained during the initial 1 hour following the intervention, is believed to be the result of a combination of factors. These factors may include, but are not limited to: 1. The effectiveness of the local anesthetics used. 2. The effects of the analgesic(s) and/or anxiolytic(s) used. 3. The degree of discomfort experienced by the patient at the time of the procedure. 4. The patients ability and reliability in recalling and recording the events. 5. The presence and influence of possible secondary gains and/or psychosocial factors. Reported result: Relief experienced during the 1st hour after the procedure: 100 % (numb 2.5 hours) (Ultra-Short Term Relief)            Interpretative annotation: Clinically appropriate result. Analgesia during this period is likely to be Local Anesthetic and/or IV Sedative (Analgesic/Anxiolytic) related.          Effects of local anesthetic: The analgesic effects attained during this period are directly associated to the localized infiltration of local anesthetics and therefore cary significant diagnostic value as to the etiological location, or anatomical origin, of the pain. Expected duration of relief is directly dependent on the  pharmacodynamics of the local anesthetic used. Long-acting (4-6 hours) anesthetics used.  Reported result: Relief during the next 4 to 6 hour after the procedure: 50 % (Short-Term Relief)            Interpretative annotation: Clinically appropriate result. Analgesia during this period is likely to be Local Anesthetic-related.          Long-term benefit: Defined as the period of time past  the expected duration of local anesthetics (1 hour for short-acting and 4-6 hours for long-acting). With the possible exception of prolonged sympathetic blockade from the local anesthetics, benefits during this period are typically attributed to, or associated with, other factors such as analgesic sensory neuropraxia, antiinflammatory effects, or beneficial biochemical changes provided by agents other than the local anesthetics.  Reported result: Extended relief following procedure: 0 % (Long-Term Relief)            Interpretative annotation: Clinically appropriate result. Good relief. No permanent benefit expected. Inflammation plays a part in the etiology to the pain.          Current benefits: Defined as persistent relief that continues at this point in time.   Reported results: Treated area: 0 %       Interpretative annotation: Recurrence of symptoms. No permanent benefit expected. Effective diagnostic intervention.          Interpretation: Results would suggest a successful diagnostic intervention. We'll proceed with diagnostic intervention #2, as soon as convenient          Plan:  Please see "Plan of Care" for details.  Laboratory Chemistry  Inflammation Markers (CRP: Acute Phase) (ESR: Chronic Phase) Lab Results  Component Value Date   CRP <0.8 04/25/2017   ESRSEDRATE 63 (H) 04/25/2017                 Renal Function Markers Lab Results  Component Value Date   BUN 27 (H) 04/25/2017   CREATININE 1.39 (H) 04/25/2017   GFRAA 48 (L) 04/25/2017   GFRNONAA 42 (L) 04/25/2017                 Hepatic Function Markers Lab Results  Component Value Date   AST 17 04/25/2017   ALT 11 (L) 04/25/2017   ALBUMIN 3.5 04/25/2017   ALKPHOS 94 04/25/2017                 Electrolytes Lab Results  Component Value Date   NA 136 04/25/2017   K 4.3 04/25/2017   CL 100 (L) 04/25/2017   CALCIUM 9.5 04/25/2017   MG 1.7 04/25/2017                 Neuropathy Markers Lab Results  Component  Value Date   VITAMINB12 402 04/25/2017                 Bone Pathology Markers Lab Results  Component Value Date   ALKPHOS 94 04/25/2017   VD25OH 19.7 (L) 04/25/2016   25OHVITD1 36 04/25/2017   25OHVITD2 <1.0 04/25/2017   25OHVITD3 36 04/25/2017   CALCIUM 9.5 04/25/2017                 Coagulation Parameters Lab Results  Component Value Date   PLT 260 04/25/2017                 Cardiovascular Markers Lab Results  Component Value Date   HGB 11.1 (L) 04/25/2017   HCT 35.2 04/25/2017  Note: Lab results reviewed.  Recent Diagnostic Imaging Review  Dg C-arm 1-60 Min-no Report  Result Date: 07/25/2017 Fluoroscopy was utilized by the requesting physician.  No radiographic interpretation.   Note: Imaging results reviewed.          Meds   Current Meds  Medication Sig  . Ascorbic Acid (VITAMIN C) 1000 MG tablet Take 1,000 mg by mouth daily.  Marland Kitchen atorvastatin (LIPITOR) 20 MG tablet Take 20 mg by mouth at bedtime.  . Calcium Carbonate-Vitamin D3 (CALCIUM 600-D) 600-400 MG-UNIT TABS Take by mouth 2 (two) times daily.  . cholecalciferol (VITAMIN D) 1000 units tablet Take 5,000 Units by mouth daily.  Marland Kitchen doxycycline (VIBRAMYCIN) 100 MG capsule Take 100 mg by mouth 2 (two) times daily.  . DULoxetine (CYMBALTA) 20 MG capsule Take 60 mg by mouth daily.   . fluticasone (FLONASE) 50 MCG/ACT nasal spray   . furosemide (LASIX) 20 MG tablet Take 1 tablet (20 mg total) by mouth daily.  Marland Kitchen gabapentin (NEURONTIN) 600 MG tablet Take 600 mg by mouth 3 (three) times daily. 2 tabs tid  . insulin detemir (LEVEMIR) 100 UNIT/ML injection Inject 60 Units into the skin every morning.  . insulin lispro (HUMALOG) 100 UNIT/ML injection Inject 8 Units into the skin 3 (three) times daily before meals.  . latanoprost (XALATAN) 0.005 % ophthalmic solution Place 1 drop into both eyes at bedtime.  . metoprolol tartrate (LOPRESSOR) 25 MG tablet Take 1 tablet (25 mg total) by mouth 2 (two) times  daily.  Marland Kitchen oxybutynin (DITROPAN-XL) 10 MG 24 hr tablet Take 10 mg by mouth at bedtime.  Marland Kitchen oxyCODONE-acetaminophen (PERCOCET) 10-325 MG tablet Take 1 tablet by mouth every 6 (six) hours as needed for pain.  . pantoprazole (PROTONIX) 20 MG tablet Take 20 mg by mouth daily.  . traZODone (DESYREL) 50 MG tablet Take 1 tablet (50 mg total) by mouth at bedtime. (Patient taking differently: Take 100 mg by mouth at bedtime. )    ROS  Constitutional: Denies any fever or chills Gastrointestinal: No reported hemesis, hematochezia, vomiting, or acute GI distress Musculoskeletal: Denies any acute onset joint swelling, redness, loss of ROM, or weakness Neurological: No reported episodes of acute onset apraxia, aphasia, dysarthria, agnosia, amnesia, paralysis, loss of coordination, or loss of consciousness  Allergies  Ms. Gadsby is allergic to biaxin [clarithromycin].  PFSH  Drug: Ms. Livingston  has no drug history on file. Alcohol:  reports that she does not drink alcohol. Tobacco:  reports that she has been smoking.  She has been smoking about 0.50 packs per day. She has never used smokeless tobacco. Medical:  has a past medical history of Acute pain of right shoulder (05/19/2016); Acute PN (pyelonephritis) (05/18/2016); Acute pyelonephritis (05/18/2016); Anxiety; Arthritis; Asthma; Diabetes mellitus without complication ([place]); GERD (gastroesophageal reflux disease); Glaucoma; Hyperlipemia; Hyperlipemia; and Hypertension. Surgical: Ms. Prieur  has a past surgical history that includes Anterior cruciate ligament repair. Family: family history includes Diabetes in her mother; Heart disease in her mother; Stroke in her mother.  Constitutional Exam  General appearance: Well nourished, well developed, and well hydrated. In no apparent acute distress Vitals:   08/15/17 0817  BP: (!) 126/56  Pulse: 78  Resp: 16  Temp: 98.1 F (36.7 C)  SpO2: 97%  Weight: 261 lb (118.4 kg)  Height: 5' 3"  (1.6 m)  Psych/Mental  status: Alert, oriented x 3 (person, place, & time)       Eyes: PERLA Respiratory: No evidence of acute respiratory distress  Lumbar  Spine Area Exam  Skin & Axial Inspection: No masses, redness, or swelling Alignment: Symmetrical Functional ROM: Unrestricted ROM      Stability: No instability detected Muscle Tone/Strength: Functionally intact. No obvious neuro-muscular anomalies detected. Sensory (Neurological): Unimpaired Palpation: No palpable anomalies       Provocative Tests: Lumbar Hyperextension and rotation test: evaluation deferred today       Lumbar Lateral bending test: evaluation deferred today       Patrick's Maneuver: evaluation deferred today                    Gait & Posture Assessment  Ambulation: Patient ambulates using a wheel chair Gait: Relatively normal for age and body habitus Posture: WNL   Lower Extremity Exam    Side: Right lower extremity  Side: Left lower extremity  Skin & Extremity Inspection: Skin color, temperature, and hair growth are WNL. No peripheral edema or cyanosis. No masses, redness, swelling, asymmetry, or associated skin lesions. No contractures.  Skin & Extremity Inspection: Skin color, temperature, and hair growth are WNL. No peripheral edema or cyanosis. No masses, redness, swelling, asymmetry, or associated skin lesions. No contractures.  Functional ROM: Unrestricted ROM          Functional ROM: Unrestricted ROM          Muscle Tone/Strength: Functionally intact. No obvious neuro-muscular anomalies detected.  Muscle Tone/Strength: Functionally intact. No obvious neuro-muscular anomalies detected.  Sensory (Neurological): Unimpaired  Sensory (Neurological): Unimpaired  Palpation: No palpable anomalies  Palpation: No palpable anomalies   Assessment  Primary Diagnosis & Pertinent Problem List: The primary encounter diagnosis was Diabetic peripheral neuropathy (HCC) (Location of Primary Source of Pain) (Bilateral) (R>L). Diagnoses of Chronic  feet pain (Location of Primary Source of Pain) (Bilateral) (R>L), Chronic lower extremity pain (Location of Secondary source of pain) (Bilateral) (R>L), and Chronic pain syndrome were also pertinent to this visit.  Status Diagnosis  Controlled Controlled Controlled 1. Diabetic peripheral neuropathy (HCC) (Location of Primary Source of Pain) (Bilateral) (R>L)   2. Chronic feet pain (Location of Primary Source of Pain) (Bilateral) (R>L)   3. Chronic lower extremity pain (Location of Secondary source of pain) (Bilateral) (R>L)   4. Chronic pain syndrome     Problems updated and reviewed during this visit: Problem  Mixed Sensory-Motor Polyneuropathy   Plan of Care  Pharmacotherapy (Medications Ordered): No orders of the defined types were placed in this encounter.  New Prescriptions   No medications on file   Medications administered today: Ms. Asante had no medications administered during this visit. Lab-work, procedure(s), and/or referral(s): Orders Placed This Encounter  Procedures  . LUMBAR SYMPATHETIC BLOCK   Imaging and/or referral(s): None  Interventional management options: Planned, scheduled, and/or pending:    Diagnostic left lumbar sympathetic blocks Explained to the patient the planshe will discuss with her mother.   Considering:   Diagnostic bilateral lumbar sympathetic blocks  Possible bilateral lumbar sympathetic RFA  Diagnostic Lidocaine infusion    PRN Procedures:   To be determined at a later time    Provider-requested follow-up: Return for w/ Dr. Dossie Arbour, Procedure(w/Sedation).  Future Appointments Date Time Provider [place]  08/16/2017 8:00 AM Ricard Dillon, MD [facility] None  08/30/2017 8:00 AM Ricard Dillon, MD [facility] None  09/05/2017 8:30 AM Milinda Pointer, MD [facility] None  09/13/2017 8:00 AM Ricard Dillon, MD [facility] None   Primary Care Physician: Katelyn Jude, MD Location: [facility]  Facility Note by: Donella Stade  Dorrene German NP Date: 08/15/2017; Time: 11:39 AM  Pain Score Disclaimer: We use the NRS-11 scale. This is a self-reported, subjective measurement of pain severity with only modest accuracy. It is used primarily to identify changes within a particular patient. It must be understood that outpatient pain scales are significantly less accurate that those used for research, where they can be applied under ideal controlled circumstances with minimal exposure to variables. In reality, the score is likely to be a combination of pain intensity and pain affect, where pain affect describes the degree of emotional arousal or changes in action readiness caused by the sensory experience of pain. Factors such as social and work situation, setting, emotional state, anxiety levels, expectation, and prior pain experience may influence pain perception and show large inter-individual differences that may also be affected by time variables.  Patient instructions provided during this appointment: Patient Instructions    ____________________________________________________________________________________________  Appointment Policy Summary  It is our goal and responsibility to provide the medical community with assistance in the evaluation and management of patients with chronic pain. Unfortunately our resources are limited. Because we do not have an unlimited amount of time, or available appointments, we are required to closely monitor and manage their use. The following rules exist to maximize their use:  Patient's responsibilities: 1. Punctuality:  At what time should I arrive? You should be physically present in our office 30 minutes before your scheduled appointment. Your scheduled appointment is with your assigned healthcare provider. However, it takes 5-10 minutes to be "checked-in", and another 15 minutes for the nurses to do the admission. If you arrive to our office at the time you were  given for your appointment, you will end up being at least 20-25 minutes late to your appointment with the provider. 2. Tardiness:  What happens if I arrive only a few minutes after my scheduled appointment time? You will need to reschedule your appointment. The cutoff is your appointment time. This is why it is so important that you arrive at least 30 minutes before that appointment. If you have an appointment scheduled for 10:00 AM and you arrive at 10:01, you will be required to reschedule your appointment.  3. Plan ahead:  Always assume that you will encounter traffic on your way in. Plan for it. If you are dependent on a driver, make sure they understand these rules and the need to arrive early. 4. Other appointments and responsibilities:  Avoid scheduling any other appointments before or after your pain clinic appointments.  5. Be prepared:  Write down everything that you need to discuss with your healthcare provider and give this information to the admitting nurse. Write down the medications that you will need refilled. Bring your pills and bottles (even the empty ones), to all of your appointments, except for those where a procedure is scheduled. 6. No children or pets:  Find someone to take care of them. It is not appropriate to bring them in. 7. Scheduling changes:  We request "advanced notification" of any changes or cancellations. 8. Advanced notification:  Defined as a time period of more than 24 hours prior to the originally scheduled appointment. This allows for the appointment to be offered to other patients. 9. Rescheduling:  When a visit is rescheduled, it will require the cancellation of the original appointment. For this reason they both fall within the category of "Cancellations".  10. Cancellations:  They require advanced notification. Any cancellation less than 24 hours before the  appointment will be  recorded as a "No Show". 11. No Show:  Defined as an unkept appointment  where the patient failed to notify or declare to the practice their intention or inability to keep the appointment.  Corrective process for repeat offenders:  1. Tardiness: Three (3) episodes of rescheduling due to late arrivals will be recorded as one (1) "No Show". 2. Cancellation or reschedule: Three (3) cancellations or rescheduling will be recorded as one (1) "No Show". 3. "No Shows": Three (3) "No Shows" within a 12 month period will result in discharge from the practice.  ____________________________________________________________________________________________  Lumbar Sympathetic Block Patient Information  Description: The lumbar plexus is a group of nerves that are part of the sympathetic nervous system.  These nerves supply organs in the pelvis and legs.  Lumbar sympathetic blocks are utilized for the diagnosis and treatment of painful conditions in these areas.   The lumbar plexus is located on both sides of the aorta at approximately the level of the second lumbar vertebral body.  The block will be performed with you lying on your abdomen with a pillow underneath.  Using direct x-ray guidance,   The plexus will be located on both sides of the spine.  Numbing medicine will be used to deaden the skin prior to needle insertion.  In most cases, a small amount of sedation can be give by IV prior to the numbing medicine.  One or two small needles will be placed near the plexus and local anesthetic will be injected.  This may make your leg(s) feel warm.  The Entire block usually lasts about 15-25 minutes.  Conditions which may be treated by lumbar sympathetic block:   Reflex sympathetic dystrophy  Phantom limb pain  Peripheral neuropathy  Peripheral vascular disease ( inadequate blood flow )  Cancer pain of pelvis, leg and kidney  Preparation for the injection:  1. Do note eat any solid food or diary products within 8 hours of your appointment. 2. You may drink clear liquids up  to 3 hours before appointment.  Clear liquids include water, black coffee, juice or soda.  No milk or cream please. 3. You may take your regular medication, including pain medications, with a sip of water before you appointment.  Diabetics should hold regular insulin ( if taken separately ) and take 1/2 NPH dose the morning of the procedure .  Carry some sugar containing items with you to your appointment. 4. A driver must accompany you and be prepared to drive you home after your procedure. 5. Bring all your current medication with you. 6. An IV may be inserted and sedation may be given at the discretion of the physician.  7. A blood pressure cuff, EKG and other monitors will often be applied during the procedure.  Some patients may need to have extra oxygen administered for a short period. 8. You will be asked to provide medical information, including your allergies and medications, prior to the procedure.  We must know immediately if your taking blood thinners (like Coumadin/Warfarin) or if you are allergic to IV iodine contrast (dye).  We must know if you could possibly be pregnant.  Possible side-effects   Bleeding from needle site or deeper  Infection (rare, can require surgery)  Nerve injury (rare)  Numbness & tingling (temporary)  Collapsed lung (rare)  Spinal headache (a headache worse with upright posture)  Light-headedness (temporary)  Pain at injection site (several days)  Decreased blood pressure (temporary)  Weakness in legs (temporary)  Seizure or other  drug reaction (rare)  Call if you experience:   Fever/chills associated with headache or increased back/ neck pain  Headache worsened by an upright position  New onset weakness or numbness of an extremity below the injection site  Hives or difficulty breathing ( go to the emergency room)  Inflammation or drainage at the injections site(s)  New symptoms which are concerning to you  Please note:  If  effective, we will often do a series of 2-3 injections spaced 3-6 weeks apart to maximally decrease your pain.  If initial series is effective, you may be a candidate for a more permanent block of the lumbar sympathetic plexus.  If you have any questions please call [phone] [facility] GENERAL RISKS AND COMPLICATIONS  What are the risk, side effects and possible complications? Generally speaking, most procedures are safe.  However, with any procedure there are risks, side effects, and the possibility of complications.  The risks and complications are dependent upon the sites that are lesioned, or the type of nerve block to be performed.  The closer the procedure is to the spine, the more serious the risks are.  Great care is taken when placing the radio frequency needles, block needles or lesioning probes, but sometimes complications can occur. 1. Infection: Any time there is an injection through the skin, there is a risk of infection.  This is why sterile conditions are used for these blocks.  There are four possible types of infection. 1. Localized skin infection. 2. Central Nervous System Infection-This can be in the form of Meningitis, which can be deadly. 3. Epidural Infections-This can be in the form of an epidural abscess, which can cause pressure inside of the spine, causing compression of the spinal cord with subsequent paralysis. This would require an emergency surgery to decompress, and there are no guarantees that the patient would recover from the paralysis. 4. Discitis-This is an infection of the intervertebral discs.  It occurs in about 1% of discography procedures.  It is difficult to treat and it may lead to surgery.        2. Pain: the needles have to go through skin and soft tissues, will cause soreness.       3. Damage to internal structures:  The nerves to be lesioned may be near blood vessels or    other nerves which can be potentially  damaged.       4. Bleeding: Bleeding is more common if the patient is taking blood thinners such as  aspirin, Coumadin, Ticiid, Plavix, etc., or if he/she have some genetic predisposition  such as hemophilia. Bleeding into the spinal canal can cause compression of the spinal  cord with subsequent paralysis.  This would require an emergency surgery to  decompress and there are no guarantees that the patient would recover from the  paralysis.       5. Pneumothorax:  Puncturing of a lung is a possibility, every time a needle is introduced in  the area of the chest or upper back.  Pneumothorax refers to free air around the  collapsed lung(s), inside of the thoracic cavity (chest cavity).  Another two possible  complications related to a similar event would include: Hemothorax and Chylothorax.   These are variations of the Pneumothorax, where instead of air around the collapsed  lung(s), you may have blood or chyle, respectively.       6. Spinal headaches: They may occur with any procedures in the area of the  spine.       7. Persistent CSF (Cerebro-Spinal Fluid) leakage: This is a rare problem, but may occur  with prolonged intrathecal or epidural catheters either due to the formation of a fistulous  track or a dural tear.       8. Nerve damage: By working so close to the spinal cord, there is always a possibility of  nerve damage, which could be as serious as a permanent spinal cord injury with  paralysis.       9. Death:  Although rare, severe deadly allergic reactions known as "Anaphylactic  reaction" can occur to any of the medications used.      10. Worsening of the symptoms:  We can always make thing worse.  What are the chances of something like this happening? Chances of any of this occuring are extremely low.  By statistics, you have more of a chance of getting killed in a motor vehicle accident: while driving to the hospital than any of the above occurring .  Nevertheless, you should be aware that  they are possibilities.  In general, it is similar to taking a shower.  Everybody knows that you can slip, hit your head and get killed.  Does that mean that you should not shower again?  Nevertheless always keep in mind that statistics do not mean anything if you happen to be on the wrong side of them.  Even if a procedure has a 1 (one) in a 1,000,000 (million) chance of going wrong, it you happen to be that one..Also, keep in mind that by statistics, you have more of a chance of having something go wrong when taking medications.  Who should not have this procedure? If you are on a blood thinning medication (e.g. Coumadin, Plavix, see list of "Blood Thinners"), or if you have an active infection going on, you should not have the procedure.  If you are taking any blood thinners, please inform your physician.  How should I prepare for this procedure?  Do not eat or drink anything at least six hours prior to the procedure.  Bring a driver with you .  It cannot be a taxi.  Come accompanied by an adult that can drive you back, and that is strong enough to help you if your legs get weak or numb from the local anesthetic.  Take all of your medicines the morning of the procedure with just enough water to swallow them.  If you have diabetes, make sure that you are scheduled to have your procedure done first thing in the morning, whenever possible.  If you have diabetes, take only half of your insulin dose and notify our nurse that you have done so as soon as you arrive at the clinic.  If you are diabetic, but only take blood sugar pills (oral hypoglycemic), then do not take them on the morning of your procedure.  You may take them after you have had the procedure.  Do not take aspirin or any aspirin-containing medications, at least eleven (11) days prior to the procedure.  They may prolong bleeding.  Wear loose fitting clothing that may be easy to take off and that you would not mind if it got stained  with Betadine or blood.  Do not wear any jewelry or perfume  Remove any nail coloring.  It will interfere with some of our monitoring equipment.  NOTE: Remember that this is not meant to be interpreted as a complete list of all possible complications.  Unforeseen problems may occur.  BLOOD THINNERS The following drugs contain aspirin or other products, which can cause increased bleeding during surgery and should not be taken for 2 weeks prior to and 1 week after surgery.  If you should need take something for relief of minor pain, you may take acetaminophen which is found in Tylenol,m Datril, Anacin-3 and Panadol. It is not blood thinner. The products listed below are.  Do not take any of the products listed below in addition to any listed on your instruction sheet.  A.P.C or A.P.C with Codeine Codeine Phosphate Capsules #3 Ibuprofen Ridaura  ABC compound Congesprin Imuran rimadil  Advil Cope Indocin Robaxisal  Alka-Seltzer Effervescent Pain Reliever and Antacid Coricidin or Coricidin-D  Indomethacin Rufen  Alka-Seltzer plus Cold Medicine Cosprin Ketoprofen S-A-C Tablets  Anacin Analgesic Tablets or Capsules Coumadin Korlgesic Salflex  Anacin Extra Strength Analgesic tablets or capsules CP-2 Tablets Lanoril Salicylate  Anaprox Cuprimine Capsules Levenox Salocol  Anexsia-D Dalteparin Magan Salsalate  Anodynos Darvon compound Magnesium Salicylate Sine-off  Ansaid Dasin Capsules Magsal Sodium Salicylate  Anturane Depen Capsules Marnal Soma  APF Arthritis pain formula Dewitt's Pills Measurin Stanback  Argesic Dia-Gesic Meclofenamic Sulfinpyrazone  Arthritis Bayer Timed Release Aspirin Diclofenac Meclomen Sulindac  Arthritis pain formula Anacin Dicumarol Medipren Supac  Analgesic (Safety coated) Arthralgen Diffunasal Mefanamic Suprofen  Arthritis Strength Bufferin Dihydrocodeine Mepro Compound Suprol  Arthropan liquid Dopirydamole Methcarbomol with Aspirin Synalgos  ASA tablets/Enseals  Disalcid Micrainin Tagament  Ascriptin Doan's Midol Talwin  Ascriptin A/D Dolene Mobidin Tanderil  Ascriptin Extra Strength Dolobid Moblgesic Ticlid  Ascriptin with Codeine Doloprin or Doloprin with Codeine Momentum Tolectin  Asperbuf Duoprin Mono-gesic Trendar  Aspergum Duradyne Motrin or Motrin IB Triminicin  Aspirin plain, buffered or enteric coated Durasal Myochrisine Trigesic  Aspirin Suppositories Easprin Nalfon Trillsate  Aspirin with Codeine Ecotrin Regular or Extra Strength Naprosyn Uracel  Atromid-S Efficin Naproxen Ursinus  Auranofin Capsules Elmiron Neocylate Vanquish  Axotal Emagrin Norgesic Verin  Azathioprine Empirin or Empirin with Codeine Normiflo Vitamin E  Azolid Emprazil Nuprin Voltaren  Bayer Aspirin plain, buffered or children's or timed BC Tablets or powders Encaprin Orgaran Warfarin Sodium  Buff-a-Comp Enoxaparin Orudis Zorpin  Buff-a-Comp with Codeine Equegesic Os-Cal-Gesic   Buffaprin Excedrin plain, buffered or Extra Strength Oxalid   Bufferin Arthritis Strength Feldene Oxphenbutazone   Bufferin plain or Extra Strength Feldene Capsules Oxycodone with Aspirin   Bufferin with Codeine Fenoprofen Fenoprofen Pabalate or Pabalate-SF   Buffets II Flogesic Panagesic   Buffinol plain or Extra Strength Florinal or Florinal with Codeine Panwarfarin   Buf-Tabs Flurbiprofen Penicillamine   Butalbital Compound Four-way cold tablets Penicillin   Butazolidin Fragmin Pepto-Bismol   Carbenicillin Geminisyn Percodan   Carna Arthritis Reliever Geopen Persantine   Carprofen Gold's salt Persistin   Chloramphenicol Goody's Phenylbutazone   Chloromycetin Haltrain Piroxlcam   Clmetidine heparin Plaquenil   Cllnoril Hyco-pap Ponstel   Clofibrate Hydroxy chloroquine Propoxyphen         Before stopping any of these medications, be sure to consult the physician who ordered them.  Some, such as Coumadin (Warfarin) are ordered to prevent or treat serious conditions such as "deep  thrombosis", "pumonary embolisms", and other heart problems.  The amount of time that you may need off of the medication may also vary with the medication and the reason for which you were taking it.  If you are taking any of these medications, please make sure you notify your pain physician before you undergo any procedures.

## 2017-08-15 NOTE — Patient Instructions (Addendum)
____________________________________________________________________________________________  Appointment Policy Summary  It is our goal and responsibility to provide the medical community with assistance in the evaluation and management of patients with chronic pain. Unfortunately our resources are limited. Because we do not have an unlimited amount of time, or available appointments, we are required to closely monitor and manage their use. The following rules exist to maximize their use:  Patient's responsibilities: 1. Punctuality:  At what time should I arrive? You should be physically present in our office 30 minutes before your scheduled appointment. Your scheduled appointment is with your assigned healthcare provider. However, it takes 5-10 minutes to be "checked-in", and another 15 minutes for the nurses to do the admission. If you arrive to our office at the time you were given for your appointment, you will end up being at least 20-25 minutes late to your appointment with the provider. 2. Tardiness:  What happens if I arrive only a few minutes after my scheduled appointment time? You will need to reschedule your appointment. The cutoff is your appointment time. This is why it is so important that you arrive at least 30 minutes before that appointment. If you have an appointment scheduled for 10:00 AM and you arrive at 10:01, you will be required to reschedule your appointment.  3. Plan ahead:  Always assume that you will encounter traffic on your way in. Plan for it. If you are dependent on a driver, make sure they understand these rules and the need to arrive early. 4. Other appointments and responsibilities:  Avoid scheduling any other appointments before or after your pain clinic appointments.  5. Be prepared:  Write down everything that you need to discuss with your healthcare provider and give this information to the admitting nurse. Write down the medications that you will need  refilled. Bring your pills and bottles (even the empty ones), to all of your appointments, except for those where a procedure is scheduled. 6. No children or pets:  Find someone to take care of them. It is not appropriate to bring them in. 7. Scheduling changes:  We request "advanced notification" of any changes or cancellations. 8. Advanced notification:  Defined as a time period of more than 24 hours prior to the originally scheduled appointment. This allows for the appointment to be offered to other patients. 9. Rescheduling:  When a visit is rescheduled, it will require the cancellation of the original appointment. For this reason they both fall within the category of "Cancellations".  10. Cancellations:  They require advanced notification. Any cancellation less than 24 hours before the  appointment will be recorded as a "No Show". 11. No Show:  Defined as an unkept appointment where the patient failed to notify or declare to the practice their intention or inability to keep the appointment.  Corrective process for repeat offenders:  1. Tardiness: Three (3) episodes of rescheduling due to late arrivals will be recorded as one (1) "No Show". 2. Cancellation or reschedule: Three (3) cancellations or rescheduling will be recorded as one (1) "No Show". 3. "No Shows": Three (3) "No Shows" within a 12 month period will result in discharge from the practice.  ____________________________________________________________________________________________  Lumbar Sympathetic Block Patient Information  Description: The lumbar plexus is a group of nerves that are part of the sympathetic nervous system.  These nerves supply organs in the pelvis and legs.  Lumbar sympathetic blocks are utilized for the diagnosis and treatment of painful conditions in these areas.   The lumbar plexus is located  on both sides of the aorta at approximately the level of the second lumbar vertebral body.  The block will be  performed with you lying on your abdomen with a pillow underneath.  Using direct x-ray guidance,   The plexus will be located on both sides of the spine.  Numbing medicine will be used to deaden the skin prior to needle insertion.  In most cases, a small amount of sedation can be give by IV prior to the numbing medicine.  One or two small needles will be placed near the plexus and local anesthetic will be injected.  This may make your leg(s) feel warm.  The Entire block usually lasts about 15-25 minutes.  Conditions which may be treated by lumbar sympathetic block:   Reflex sympathetic dystrophy  Phantom limb pain  Peripheral neuropathy  Peripheral vascular disease ( inadequate blood flow )  Cancer pain of pelvis, leg and kidney  Preparation for the injection:  1. Do note eat any solid food or diary products within 8 hours of your appointment. 2. You may drink clear liquids up to 3 hours before appointment.  Clear liquids include water, black coffee, juice or soda.  No milk or cream please. 3. You may take your regular medication, including pain medications, with a sip of water before you appointment.  Diabetics should hold regular insulin ( if taken separately ) and take 1/2 NPH dose the morning of the procedure .  Carry some sugar containing items with you to your appointment. 4. A driver must accompany you and be prepared to drive you home after your procedure. 5. Bring all your current medication with you. 6. An IV may be inserted and sedation may be given at the discretion of the physician.  7. A blood pressure cuff, EKG and other monitors will often be applied during the procedure.  Some patients may need to have extra oxygen administered for a short period. 8. You will be asked to provide medical information, including your allergies and medications, prior to the procedure.  We must know immediately if your taking blood thinners (like Coumadin/Warfarin) or if you are allergic to IV  iodine contrast (dye).  We must know if you could possibly be pregnant.  Possible side-effects   Bleeding from needle site or deeper  Infection (rare, can require surgery)  Nerve injury (rare)  Numbness & tingling (temporary)  Collapsed lung (rare)  Spinal headache (a headache worse with upright posture)  Light-headedness (temporary)  Pain at injection site (several days)  Decreased blood pressure (temporary)  Weakness in legs (temporary)  Seizure or other drug reaction (rare)  Call if you experience:   Fever/chills associated with headache or increased back/ neck pain  Headache worsened by an upright position  New onset weakness or numbness of an extremity below the injection site  Hives or difficulty breathing ( go to the emergency room)  Inflammation or drainage at the injections site(s)  New symptoms which are concerning to you  Please note:  If effective, we will often do a series of 2-3 injections spaced 3-6 weeks apart to maximally decrease your pain.  If initial series is effective, you may be a candidate for a more permanent block of the lumbar sympathetic plexus.  If you have any questions please call [phone] [facility] GENERAL RISKS AND COMPLICATIONS  What are the risk, side effects and possible complications? Generally speaking, most procedures are safe.  However, with any procedure there are risks, side  effects, and the possibility of complications.  The risks and complications are dependent upon the sites that are lesioned, or the type of nerve block to be performed.  The closer the procedure is to the spine, the more serious the risks are.  Great care is taken when placing the radio frequency needles, block needles or lesioning probes, but sometimes complications can occur. 1. Infection: Any time there is an injection through the skin, there is a risk of infection.  This is why sterile conditions are used for  these blocks.  There are four possible types of infection. 1. Localized skin infection. 2. Central Nervous System Infection-This can be in the form of Meningitis, which can be deadly. 3. Epidural Infections-This can be in the form of an epidural abscess, which can cause pressure inside of the spine, causing compression of the spinal cord with subsequent paralysis. This would require an emergency surgery to decompress, and there are no guarantees that the patient would recover from the paralysis. 4. Discitis-This is an infection of the intervertebral discs.  It occurs in about 1% of discography procedures.  It is difficult to treat and it may lead to surgery.        2. Pain: the needles have to go through skin and soft tissues, will cause soreness.       3. Damage to internal structures:  The nerves to be lesioned may be near blood vessels or    other nerves which can be potentially damaged.       4. Bleeding: Bleeding is more common if the patient is taking blood thinners such as  aspirin, Coumadin, Ticiid, Plavix, etc., or if he/she have some genetic predisposition  such as hemophilia. Bleeding into the spinal canal can cause compression of the spinal  cord with subsequent paralysis.  This would require an emergency surgery to  decompress and there are no guarantees that the patient would recover from the  paralysis.       5. Pneumothorax:  Puncturing of a lung is a possibility, every time a needle is introduced in  the area of the chest or upper back.  Pneumothorax refers to free air around the  collapsed lung(s), inside of the thoracic cavity (chest cavity).  Another two possible  complications related to a similar event would include: Hemothorax and Chylothorax.   These are variations of the Pneumothorax, where instead of air around the collapsed  lung(s), you may have blood or chyle, respectively.       6. Spinal headaches: They may occur with any procedures in the area of the spine.        7. Persistent CSF (Cerebro-Spinal Fluid) leakage: This is a rare problem, but may occur  with prolonged intrathecal or epidural catheters either due to the formation of a fistulous  track or a dural tear.       8. Nerve damage: By working so close to the spinal cord, there is always a possibility of  nerve damage, which could be as serious as a permanent spinal cord injury with  paralysis.       9. Death:  Although rare, severe deadly allergic reactions known as "Anaphylactic  reaction" can occur to any of the medications used.      10. Worsening of the symptoms:  We can always make thing worse.  What are the chances of something like this happening? Chances of any of this occuring are extremely low.  By statistics, you have more of a chance  of getting killed in a motor vehicle accident: while driving to the hospital than any of the above occurring .  Nevertheless, you should be aware that they are possibilities.  In general, it is similar to taking a shower.  Everybody knows that you can slip, hit your head and get killed.  Does that mean that you should not shower again?  Nevertheless always keep in mind that statistics do not mean anything if you happen to be on the wrong side of them.  Even if a procedure has a 1 (one) in a 1,000,000 (million) chance of going wrong, it you happen to be that one..Also, keep in mind that by statistics, you have more of a chance of having something go wrong when taking medications.  Who should not have this procedure? If you are on a blood thinning medication (e.g. Coumadin, Plavix, see list of "Blood Thinners"), or if you have an active infection going on, you should not have the procedure.  If you are taking any blood thinners, please inform your physician.  How should I prepare for this procedure?  Do not eat or drink anything at least six hours prior to the procedure.  Bring a driver with you .  It cannot be a taxi.  Come accompanied by an adult that can drive  you back, and that is strong enough to help you if your legs get weak or numb from the local anesthetic.  Take all of your medicines the morning of the procedure with just enough water to swallow them.  If you have diabetes, make sure that you are scheduled to have your procedure done first thing in the morning, whenever possible.  If you have diabetes, take only half of your insulin dose and notify our nurse that you have done so as soon as you arrive at the clinic.  If you are diabetic, but only take blood sugar pills (oral hypoglycemic), then do not take them on the morning of your procedure.  You may take them after you have had the procedure.  Do not take aspirin or any aspirin-containing medications, at least eleven (11) days prior to the procedure.  They may prolong bleeding.  Wear loose fitting clothing that may be easy to take off and that you would not mind if it got stained with Betadine or blood.  Do not wear any jewelry or perfume  Remove any nail coloring.  It will interfere with some of our monitoring equipment.  NOTE: Remember that this is not meant to be interpreted as a complete list of all possible complications.  Unforeseen problems may occur.  BLOOD THINNERS The following drugs contain aspirin or other products, which can cause increased bleeding during surgery and should not be taken for 2 weeks prior to and 1 week after surgery.  If you should need take something for relief of minor pain, you may take acetaminophen which is found in Tylenol,m Datril, Anacin-3 and Panadol. It is not blood thinner. The products listed below are.  Do not take any of the products listed below in addition to any listed on your instruction sheet.  A.P.C or A.P.C with Codeine Codeine Phosphate Capsules #3 Ibuprofen Ridaura  ABC compound Congesprin Imuran rimadil  Advil Cope Indocin Robaxisal  Alka-Seltzer Effervescent Pain Reliever and Antacid Coricidin or Coricidin-D  Indomethacin Rufen   Alka-Seltzer plus Cold Medicine Cosprin Ketoprofen S-A-C Tablets  Anacin Analgesic Tablets or Capsules Coumadin Korlgesic Salflex  Anacin Extra Strength Analgesic tablets or capsules CP-2 Tablets  Lanoril Salicylate  Anaprox Cuprimine Capsules Levenox Salocol  Anexsia-D Dalteparin Magan Salsalate  Anodynos Darvon compound Magnesium Salicylate Sine-off  Ansaid Dasin Capsules Magsal Sodium Salicylate  Anturane Depen Capsules Marnal Soma  APF Arthritis pain formula Dewitt's Pills Measurin Stanback  Argesic Dia-Gesic Meclofenamic Sulfinpyrazone  Arthritis Bayer Timed Release Aspirin Diclofenac Meclomen Sulindac  Arthritis pain formula Anacin Dicumarol Medipren Supac  Analgesic (Safety coated) Arthralgen Diffunasal Mefanamic Suprofen  Arthritis Strength Bufferin Dihydrocodeine Mepro Compound Suprol  Arthropan liquid Dopirydamole Methcarbomol with Aspirin Synalgos  ASA tablets/Enseals Disalcid Micrainin Tagament  Ascriptin Doan's Midol Talwin  Ascriptin A/D Dolene Mobidin Tanderil  Ascriptin Extra Strength Dolobid Moblgesic Ticlid  Ascriptin with Codeine Doloprin or Doloprin with Codeine Momentum Tolectin  Asperbuf Duoprin Mono-gesic Trendar  Aspergum Duradyne Motrin or Motrin IB Triminicin  Aspirin plain, buffered or enteric coated Durasal Myochrisine Trigesic  Aspirin Suppositories Easprin Nalfon Trillsate  Aspirin with Codeine Ecotrin Regular or Extra Strength Naprosyn Uracel  Atromid-S Efficin Naproxen Ursinus  Auranofin Capsules Elmiron Neocylate Vanquish  Axotal Emagrin Norgesic Verin  Azathioprine Empirin or Empirin with Codeine Normiflo Vitamin E  Azolid Emprazil Nuprin Voltaren  Bayer Aspirin plain, buffered or children's or timed BC Tablets or powders Encaprin Orgaran Warfarin Sodium  Buff-a-Comp Enoxaparin Orudis Zorpin  Buff-a-Comp with Codeine Equegesic Os-Cal-Gesic   Buffaprin Excedrin plain, buffered or Extra Strength Oxalid   Bufferin Arthritis Strength Feldene  Oxphenbutazone   Bufferin plain or Extra Strength Feldene Capsules Oxycodone with Aspirin   Bufferin with Codeine Fenoprofen Fenoprofen Pabalate or Pabalate-SF   Buffets II Flogesic Panagesic   Buffinol plain or Extra Strength Florinal or Florinal with Codeine Panwarfarin   Buf-Tabs Flurbiprofen Penicillamine   Butalbital Compound Four-way cold tablets Penicillin   Butazolidin Fragmin Pepto-Bismol   Carbenicillin Geminisyn Percodan   Carna Arthritis Reliever Geopen Persantine   Carprofen Gold's salt Persistin   Chloramphenicol Goody's Phenylbutazone   Chloromycetin Haltrain Piroxlcam   Clmetidine heparin Plaquenil   Cllnoril Hyco-pap Ponstel   Clofibrate Hydroxy chloroquine Propoxyphen         Before stopping any of these medications, be sure to consult the physician who ordered them.  Some, such as Coumadin (Warfarin) are ordered to prevent or treat serious conditions such as "deep thrombosis", "pumonary embolisms", and other heart problems.  The amount of time that you may need off of the medication may also vary with the medication and the reason for which you were taking it.  If you are taking any of these medications, please make sure you notify your pain physician before you undergo any procedures.        BMI Assessment: Estimated body mass index is 46.23 kg/m as calculated from the following:   Height as of this encounter: 5\' 3"  (1.6 m).   Weight as of this encounter: 261 lb (118.4 kg).  BMI interpretation table: BMI level Category Range association with higher incidence of chronic pain  <18 kg/m2 Underweight   18.5-24.9 kg/m2 Ideal body weight   25-29.9 kg/m2 Overweight Increased incidence by 20%  30-34.9 kg/m2 Obese (Class I) Increased incidence by 68%  35-39.9 kg/m2 Severe obesity (Class II) Increased incidence by 136%  >40 kg/m2 Extreme obesity (Class III) Increased incidence by 254%   BMI Readings from Last 4 Encounters:  08/15/17 46.23 kg/m  08/07/17 46.23  kg/m  07/25/17 48.36 kg/m  07/04/17 48.36 kg/m   Wt Readings from Last 4 Encounters:  08/15/17 261 lb (118.4 kg)  08/07/17 261 lb (118.4 kg)  07/25/17 273 lb (123.8  kg)  07/04/17 273 lb (123.8 kg)

## 2017-08-15 NOTE — Progress Notes (Signed)
Safety precautions to be maintained throughout the outpatient stay will include: orient to surroundings, keep bed in low position, maintain call bell within reach at all times, provide assistance with transfer out of bed and ambulation.  

## 2017-08-16 ENCOUNTER — Ambulatory Visit: Payer: BLUE CROSS/BLUE SHIELD | Admitting: Pain Medicine

## 2017-08-16 ENCOUNTER — Encounter: Payer: BLUE CROSS/BLUE SHIELD | Admitting: Internal Medicine

## 2017-08-16 DIAGNOSIS — L89314 Pressure ulcer of right buttock, stage 4: Secondary | ICD-10-CM | POA: Diagnosis not present

## 2017-08-17 NOTE — Progress Notes (Signed)
MILLY, GOGGINS ([id number]) Visit Report for 08/16/2017 Arrival Information Details Patient Name: [name], [name] Date of Service: 08/16/2017 8:00 AM Medical Record Patient Account Number: [id number] Number: Treating RN: Ahmed Prima 1961/02/03 (56 y.o. Other Clinician: Date of Birth/Sex: Female) Treating ROBSON, MICHAEL Primary Care Jenee Spaugh: Lavera Guise Tison Leibold/Extender: G Referring Ski Polich: Sallee Lange in Treatment: 42 Visit Information History Since Last Visit All ordered tests and consults were completed: No Patient Arrived: Wheel Chair Added or deleted any medications: No Arrival Time: 08:03 Any new allergies or adverse reactions: No Accompanied By: self Had a fall or experienced change in No Transfer Assistance: EasyPivot activities of daily living that may affect Patient Lift risk of falls: Patient Identification Verified: Yes Signs or symptoms of abuse/neglect since last No Secondary Verification Process Yes visito Completed: Hospitalized since last visit: No Patient Requires Transmission- No Has Dressing in Place as Prescribed: Yes Based Precautions: Pain Present Now: No Patient Has Alerts: Yes Patient Alerts: DM II Electronic Signature(s) Signed: 08/16/2017 4:59:51 PM By: Alric Quan Entered By: Alric Quan on 08/16/2017 08:05:53 [place], [name] ([id number]) -------------------------------------------------------------------------------- Encounter Discharge Information Details Patient Name: [name] Date of Service: 08/16/2017 8:00 AM Medical Record Patient Account Number: [id number] Number: Treating RN: Ahmed Prima [birth year] (56 y.o. Other Clinician: Date of Birth/Sex: Female) Treating ROBSON, MICHAEL Primary Care Jameshia Hayashida: Lavera Guise Marycatherine Maniscalco/Extender: G Referring Aisling Emigh: Sallee Lange in Treatment: 3 Encounter Discharge Information Items Discharge Pain Level: 0 Discharge Condition:  Stable Ambulatory Status: Wheelchair Discharge Destination: Home Transportation: Other Accompanied By: self Schedule Follow-up Appointment: Yes Medication Reconciliation completed No and provided to Patient/Care [name]: Patient Clinical Summary of Care: Declined Electronic Signature(s) Signed: 08/16/2017 4:59:51 PM By: Alric Quan Entered By: Alric Quan on 08/16/2017 08:35:30 [place], [name] ([id number]) -------------------------------------------------------------------------------- Lower Extremity Assessment Details Patient Name: [name] Date of Service: 08/16/2017 8:00 AM Medical Record Patient Account Number: [id number] Number: Treating RN: Ahmed Prima 07/27/1961 (56 y.o. Other Clinician: Date of Birth/Sex: Female) Treating ROBSON, MICHAEL Primary Care Kameshia Madruga: Lavera Guise Cordel Drewes/Extender: G Referring Belal Scallon: Sallee Lange in Treatment: 64 Electronic Signature(s) Signed: 08/16/2017 4:59:51 PM By: Alric Quan Entered By: Alric Quan on 08/16/2017 08:12:47 [place], [name] ([id number]) -------------------------------------------------------------------------------- Multi Wound Chart Details Patient Name: [name] Date of Service: 08/16/2017 8:00 AM Medical Record Patient Account Number: [id number] Number: Treating RN: Ahmed Prima Jul 27, 1961 (56 y.o. Other Clinician: Date of Birth/Sex: Female) Treating ROBSON, MICHAEL Primary Care Cari Burgo: Lavera Guise Chauncey Bruno/Extender: G Referring Dajana Gehrig: Sallee Lange in Treatment: 47 Vital Signs Height(in): 63 Pulse(bpm): 89 Weight(lbs): 257 Blood Pressure 133/68 (mmHg): Body Mass Index(BMI): 46 Temperature(F): 98.3 Respiratory Rate 18 (breaths/min): Photos: [3:No Photos] [N/A:N/A] Wound Location: [3:Right Gluteal fold] [N/A:N/A] Wounding Event: [3:Pressure Injury] [N/A:N/A] Primary Etiology: [3:Pressure Ulcer] [N/A:N/A] Comorbid History:  [3:Asthma, Hypertension, Type II Diabetes, Neuropathy] [N/A:N/A] Date Acquired: [3:04/21/2016] [N/A:N/A] Weeks of Treatment: [3:47] [N/A:N/A] Wound Status: [3:Open] [N/A:N/A] Measurements L x W x D 0.5x0.7x2.3 [N/A:N/A] (cm) Area (cm) : [3:0.275] [N/A:N/A] Volume (cm) : [3:0.632] [N/A:N/A] % Reduction in Area: [3:91.00%] [N/A:N/A] % Reduction in Volume: 96.30% [N/A:N/A] Classification: [3:Category/Stage IV] [N/A:N/A] Exudate Amount: [3:Large] [N/A:N/A] Exudate Type: [3:Serous] [N/A:N/A] Exudate Color: [3:amber] [N/A:N/A] Foul Odor After [3:Yes] [N/A:N/A] Cleansing: Odor Anticipated Due to No [N/A:N/A] Product Use: Wound Margin: [3:Distinct, outline attached] [N/A:N/A] Granulation Amount: [3:Medium (34-66%)] [N/A:N/A] Granulation Quality: [3:Red] [N/A:N/A] Necrotic Amount: [3:Medium (34-66%)] [N/A:N/A] Exposed Structures: Fat Layer (Subcutaneous N/A N/A Tissue) Exposed: Yes Epithelialization: Small (1-33%) N/A N/A Periwound Skin Texture:  Scarring: Yes N/A N/A Excoriation: No Induration: No Callus: No Crepitus: No Rash: No Periwound Skin Maceration: Yes N/A N/A Moisture: Dry/Scaly: No Periwound Skin Color: Atrophie Blanche: No N/A N/A Cyanosis: No Ecchymosis: No Erythema: No Hemosiderin Staining: No Mottled: No Pallor: No Rubor: No Temperature: No Abnormality N/A N/A Tenderness on Yes N/A N/A Palpation: Wound Preparation: Ulcer Cleansing: N/A N/A Rinsed/Irrigated with Saline Topical Anesthetic Applied: Other: lidocaine 4% Treatment Notes Electronic Signature(s) Signed: 08/16/2017 5:02:24 PM By: Linton Ham MD Entered By: Linton Ham on 08/16/2017 08:21:24 [place], [name] ([id number]) -------------------------------------------------------------------------------- Multi-Disciplinary Care Plan Details Patient Name: [name] Date of Service: 08/16/2017 8:00 AM Medical Record Patient Account Number: [id number] Number: Treating RN:  Ahmed Prima 1961-06-11 (56 y.o. Other Clinician: Date of Birth/Sex: Female) Treating ROBSON, MICHAEL Primary Care : Lavera Guise /Extender: G Referring : Sallee Lange in Treatment: 41 Active Inactive ` Abuse / Safety / Falls / Self Care Management Nursing Diagnoses: Potential for falls Goals: Patient will remain injury free Date Initiated: 09/21/2016 Target Resolution Date: 03/25/2017 Goal Status: Active Interventions: Assess fall risk on admission and as needed Notes: ` Nutrition Nursing Diagnoses: Imbalanced nutrition Potential for alteratiion in Nutrition/Potential for imbalanced nutrition Goals: Patient/caregiver agrees to and verbalizes understanding of need to use nutritional supplements and/or vitamins as prescribed Date Initiated: 09/21/2016 Target Resolution Date: 03/25/2017 Goal Status: Active Patient/caregiver verbalizes understanding of need to maintain therapeutic glucose control per primary care physician Date Initiated: 09/21/2016 Target Resolution Date: 03/25/2017 Goal Status: Active Interventions: Assess patient nutrition upon admission and as needed per policy Birnbaum, Mize. ([id number]) Notes: ` Orientation to the Wound Care Program Nursing Diagnoses: Knowledge deficit related to the wound healing center program Goals: Patient/caregiver will verbalize understanding of the [place] Date Initiated: 09/21/2016 Target Resolution Date: 03/25/2017 Goal Status: Active Interventions: Provide education on orientation to the wound center Notes: ` Pain, Acute or Chronic Nursing Diagnoses: Pain, acute or chronic: actual or potential Potential alteration in comfort, pain Goals: Patient will verbalize adequate pain control and receive pain control interventions during procedures as needed Date Initiated: 09/21/2016 Target Resolution Date: 03/25/2017 Goal Status: Active Patient/caregiver will verbalize  adequate pain control between visits Date Initiated: 09/21/2016 Target Resolution Date: 03/25/2017 Goal Status: Active Patient/caregiver will verbalize comfort level met Date Initiated: 09/21/2016 Target Resolution Date: 03/25/2017 Goal Status: Active Interventions: Assess comfort goal upon admission Complete pain assessment as per visit requirements Notes: ` Wound/Skin Impairment Nursing Diagnoses: DEKAYLA, PRESTRIDGE ([id number]) Impaired tissue integrity Goals: Ulcer/skin breakdown will have a volume reduction of 30% by week 4 Date Initiated: 09/21/2016 Target Resolution Date: 03/25/2017 Goal Status: Active Ulcer/skin breakdown will have a volume reduction of 50% by week 8 Date Initiated: 09/21/2016 Target Resolution Date: 03/25/2017 Goal Status: Active Ulcer/skin breakdown will have a volume reduction of 80% by week 12 Date Initiated: 09/21/2016 Target Resolution Date: 03/25/2017 Goal Status: Active Interventions: Assess ulceration(s) every visit Notes: Electronic Signature(s) Signed: 08/16/2017 4:59:51 PM By: Alric Quan Entered By: Alric Quan on 08/16/2017 08:15:58 Goucher, Oliver ([id number]) -------------------------------------------------------------------------------- Pain Assessment Details Patient Name: [name] Date of Service: 08/16/2017 8:00 AM Medical Record Patient Account Number: [id number] Number: Treating RN: Ahmed Prima 03-22-1961 (56 y.o. Other Clinician: Date of Birth/Sex: Female) Treating ROBSON, MICHAEL Primary Care : Lavera Guise /Extender: G Referring : Sallee Lange in Treatment: 35 Active Problems Location of Pain Severity and Description of Pain Patient Has Paino No Site Locations With Dressing Change: No Pain Management and  Medication Current Pain Management: Electronic Signature(s) Signed: 08/16/2017 4:59:51 PM By: Alric Quan Entered By: Alric Quan on 08/16/2017  08:06:00 [place], [name] ([id number]) -------------------------------------------------------------------------------- Patient/Caregiver Education Details Patient Name: [name] Date of Service: 08/16/2017 8:00 AM Medical Record Patient Account Number: [id number] Number: Treating RN: Ahmed Prima 1961-02-25 (56 y.o. Other Clinician: Date of Birth/Gender: Female) Treating ROBSON, MICHAEL Primary Care Physician: Lavera Guise Physician/Extender: G Referring Physician: Sallee Lange in Treatment: 65 Education Assessment Education Provided To: Patient Education Topics Provided Wound/Skin Impairment: Handouts: Other: change dressing as ordered Methods: Demonstration, Explain/Verbal Responses: State content correctly Electronic Signature(s) Signed: 08/16/2017 4:59:51 PM By: Alric Quan Entered By: Alric Quan on 08/16/2017 08:35:40 [place], [name] ([id number]) -------------------------------------------------------------------------------- Wound Assessment Details Patient Name: [name] Date of Service: 08/16/2017 8:00 AM Medical Record Patient Account Number: [id number] Number: Treating RN: Ahmed Prima 07/20/1961 (56 y.o. Other Clinician: Date of Birth/Sex: Female) Treating ROBSON, MICHAEL Primary Care Astra Gregg: Lavera Guise Garold Sheeler/Extender: G Referring Osceola Holian: Sallee Lange in Treatment: 47 Wound Status Wound Number: 3 Primary Pressure Ulcer Etiology: Wound Location: Right Gluteal fold Wound Status: Open Wounding Event: Pressure Injury Comorbid Asthma, Hypertension, Type II Date Acquired: 04/21/2016 History: Diabetes, Neuropathy Weeks Of Treatment: 47 Clustered Wound: No Photos Photo Uploaded By: Alric Quan on 08/16/2017 16:26:14 Wound Measurements Length: (cm) 0.5 Width: (cm) 0.7 Depth: (cm) 2.3 Area: (cm) 0.275 Volume: (cm) 0.632 % Reduction in Area: 91% % Reduction in Volume:  96.3% Epithelialization: Small (1-33%) Tunneling: No Undermining: No Wound Description Classification: Category/Stage IV Foul Odor Aft Wound Margin: Distinct, outline attached Due to Produc Exudate Amount: Large Slough/Fibrin Exudate Type: Serous Exudate Color: amber er Cleansing: Yes t Use: No o Yes Wound Bed Granulation Amount: Medium (34-66%) Exposed Structure Granulation Quality: Red Fat Layer (Subcutaneous Tissue) Exposed: Yes Necrotic Amount: Medium (34-66%) Rondon, Jules L. ([id number]) Necrotic Quality: Adherent Slough Periwound Skin Texture Texture Color No Abnormalities Noted: No No Abnormalities Noted: No Callus: No Atrophie Blanche: No Crepitus: No Cyanosis: No Excoriation: No Ecchymosis: No Induration: No Erythema: No Rash: No Hemosiderin Staining: No Scarring: Yes Mottled: No Pallor: No Moisture Rubor: No No Abnormalities Noted: No Dry / Scaly: No Temperature / Pain Maceration: Yes Temperature: No Abnormality Tenderness on Palpation: Yes Wound Preparation Ulcer Cleansing: Rinsed/Irrigated with Saline Topical Anesthetic Applied: Other: lidocaine 4%, Treatment Notes Wound #3 (Right Gluteal fold) 1. Cleansed with: Clean wound with Normal Saline 2. Anesthetic Topical Lidocaine 4% cream to wound bed prior to debridement 3. Peri-wound Care: Skin Prep 4. Dressing Applied: Other dressing (specify in notes) 5. Secondary Dressing Applied ABD Pad Dry Gauze Notes [employer] Electronic Signature(s) Signed: 08/16/2017 4:59:51 PM By: Alric Quan Entered By: Alric Quan on 08/16/2017 08:12:25 [place], [name] ([id number]) -------------------------------------------------------------------------------- [place] Details Patient Name: [name] Date of Service: 08/16/2017 8:00 AM Medical Record Patient Account Number: [id number] Number: Treating RN: Ahmed Prima [birth year] (56 y.o. Other Clinician: Date of Birth/Sex: Female)  Treating ROBSON, MICHAEL Primary Care Aletha Allebach: Lavera Guise Zaydan Papesh/Extender: G Referring Sakari Alkhatib: Sallee Lange in Treatment: 74 Vital Signs Time Taken: 08:06 Temperature (F): 98.3 Height (in): 63 Pulse (bpm): 89 Weight (lbs): 257 Respiratory Rate (breaths/min): 18 Body Mass Index (BMI): 45.5 Blood Pressure (mmHg): 133/68 Reference Range: 80 - 120 mg / dl Electronic Signature(s) Signed: 08/16/2017 4:59:51 PM By: Alric Quan Entered By: Alric Quan on 08/16/2017 08:07:48

## 2017-08-17 NOTE — Progress Notes (Signed)
Katelyn, [name] ([id number]) Visit Report for 08/16/2017 Debridement Details Patient Name: [name], [name] Date of Service: 08/16/2017 8:00 AM Medical Record Patient Account Number: [id number] Number: Treating RN: Phillis Haggis 1961/08/11 (56 y.o. Other Clinician: Date of Birth/Sex: Female) Treating Helene Bernstein Primary Care Provider: Joen [name] Provider/Extender: G Referring Provider: Tedra Senegal in Treatment: 47 Debridement Performed for Wound #3 Right Gluteal fold Assessment: Performed By: Physician Maxwell Caul, MD Debridement: Debridement Pre-procedure Verification/Time Out Yes - 08:16 Taken: Start Time: 08:17 Pain Control: Lidocaine 4% Topical Solution Level: Skin/Subcutaneous Tissue Total Area Debrided (L x 0.5 (cm) x 0.7 (cm) = 0.35 (cm) W): Tissue and other Viable, Non-Viable, Exudate, Fibrin/Slough, Subcutaneous material debrided: Instrument: Other : scoop Bleeding: Minimum Hemostasis Achieved: Pressure End Time: 08:19 Procedural Pain: 0 Post Procedural Pain: 0 Response to Treatment: Procedure was tolerated well Post Debridement Measurements of Total Wound Length: (cm) 0.6 Stage: Category/Stage IV Width: (cm) 0.8 Depth: (cm) 2.3 Volume: (cm) 0.867 Character of Wound/Ulcer Post Requires Further Debridement: Debridement Post Procedure Diagnosis Same as Pre-procedure Electronic Signature(s) AVAGAIL, WHITTLESEY ([id number]) Signed: 08/16/2017 4:59:51 PM By: Alejandro Mulling Signed: 08/16/2017 5:02:24 PM By: Baltazar Najjar MD Entered By: Baltazar Najjar on 08/16/2017 08:30:10 Siegfried, [name] ([id number]) -------------------------------------------------------------------------------- HPI Details Patient Name: [name] Date of Service: 08/16/2017 8:00 AM Medical Record Patient Account Number: [id number] Number: Treating RN: Phillis Haggis October 12, 1961 (56 y.o. Other Clinician: Date of Birth/Sex: Female) Treating  Zaki Gertsch Primary Care Provider: Joen [name] Provider/Extender: G Referring Provider: Tedra Senegal in Treatment: 53 History of Present Illness Location: bilateral gluteal ulcerations and left popliteal ulceration with tunneling Quality: adits to intermittent aching to ischial ulcers, no discomfort to sinus tract Severity: right iscial ulcer with increased depth Duration: chronic ulcers to bilateral ischial ulcers and sinus tract Timing: The pain is intermittent in severity as far as how intense it becomes but is present all the time. Manipulationn makes this worse. Context: The wound occurred when the patient had a fall and was unconscious for about 48 hours laying on the floor and she had pressure injury at that stage. Modifying Factors: Other treatment(s) tried include:as noted below she has been seen by visiting wound care physicians or nurse practitioners and details have been noted Associated Signs and Symptoms: has yet to receive offloading cushions and mattress overlay HPI Description: 56 year old patient who was seen by visiting [name] wound care specialist for a wound on both her buttock and was found to have an unstageable wound on the right buttock for about 2 months. I understand that she had a fall and was laying on the floor for about 48 hours before she was found and taken to the ICU and had a long injury to her gluteal area from pressure and also had broken her right humerus. She has had a right proximal humerus fracture and has been followed up with orthopedics recently. The patient has a past medical history of type 2 diabetes mellitus, paraparesis, acute pyelonephritis, GERD, hypertension, glaucoma, chronic pain, anxiety neurosis, nicotine dependence, COPD. the patient had some debridement done and was to operative was recommended to use Silvadene dressing and offloading. She is a smoker and occasionally smokes a few cigarettes. the patient requested a second  opinion for months and is here to discuss her care. 09/21/16; the patient re-presents from home today for review of 3 different wounds. I note that she was seen in the clinic here in July at which time she had bilateral buttock  wounds. It was apparently suggested at that time that she use a wound VAC bridged to both wounds just near the initial tuberosity's bilaterally which she refused. The history was a bit difficult to put together. Apparently this patient became ill at the end of April of this year. She was found sitting on the floor she had apparently been for 2 days and subsequently admitted to hospital from 04/23/16 through 05/02/16 and at that point she was critically ill ultimately having sepsis secondary to UTI, nontraumatic rhabdomyolysis and diabetic ketoacidosis. She had acute renal failure and I think required ICU care including intubation. Patient states her wounds actually started at that point on the bilateral issue tuberosities however in reviewing the discharge summary from 5/8 I see no reference to wounds at that point. It did state that she had left lower extremity cellulitis however. Reviewing Epic I see no relevant x-rays. It would appear that her discharge creatinine was within the normal range and indeed on 9/15 her creatinine has remained normal. [facility] JENNEFER, KOPP. ([id number]) rehabilitation. There the wounds on her bilateral Buttocks were dressed. Only just before her discharge from the nursing facility she developed an "knot" which was interpreted as cellulitis on the posterior aspect of her left knee she was given antibiotics. Apparently sometime late in July a this actually opened and became a wound at home health care was tending to however she is still having purulent drainage coming from this and by my understanding the wound depth is actually become unmeasurable. I am not really clear about what home health has been  placing in any of these wound areas. The patient states that is something with silver and it. She is not been systemically unwell no fever or chills her appetite is good. She is a diabetic poorly controlled however she states that her recent blood sugars at home have been in the low to mid 100s. 09/28/16 On evaluation today patient appears to continue to exhibit the 3 areas of ulceration that were noted previous. She did have an x-ray of the right pelvis which showed evidence of potential soft tissue infection but no obvious osteomyelitis. There was a discussion last office visit concerning the possibility of a wound VAC. Witth that being said the x-ray report suggested that an MRI may be more appropriate to further evaluate the extent. Subsequently in regard to the wound over the popliteal portion of the left lower extremity with tunneling at 12:00 the CT scan that was ordered was denied by insurance as they state the patient has not had x-rays prior to advanced imaging. Patient states that she is frustrated with the situation overall. 10/05/16 in the interval since I last saw this patient last week she has had the x-ray of the knee performed. I did review that x-ray today and fortunately shows no evidence of osteomyelitis or other acute abnormality at this point in time. She continues to have the opening iin the posterior left popliteal space with tracking proximal up the posterior thigh. Nothing seems to have worsened but it also seems to have not improved. The same is true in regard to the right pressure ulcer over the gluteal region which extends toward the ischium. The left gluteal pressure ulcer actually appears to be doing somewhat better my opinion there is some necrotic slough but overall this appears fairly well. She tells me thatt she has some discomfort especially when home health is helping her with dressing changes as  they do not know her. At worse she rates her pain to be a 5 out  of 10 right now it's more like a 1 out of 10. 10/07/16; still the patient has 3 different wound areas. She has a deep stage IV wound over her right ischial tuberosity. She is due to have an MRI next week. The wound over her left ischial tuberosity is more superficial and underwent debridement today. Finally she has a small open area in her left popliteal fossa the probes on measurably forward superiorly. Still a lot of drainage coming out of this. The CT scan that I ordered 3 weeks ago was questioned by her insurance company wanting a plain x-ray first. As I understand things result of this is nothing has been done in 3 weeks in terms of imaging the thigh and she has an MRI booked of this along with her pelvis for next week line 10/12/16; patient has a deep probing wound over the right ischiall tuberosity, stage III wound over the left visual tuberosity and a draining sinus in her left popliteal fossa. None of this much different from when I saw this 3 weeks ago. We have been using silver rope to the right ischial wound and a draining area in the left popliteal fossa. Plain silver alginate to the area on the left ischial tuberosity 10/19/16; the patient's wounds are essentially unchanged although the area on the left lower gluteal is actually improved. Our intake nurse noted drainage from the right initial tuberosity probing wound as well as the draining area in the left popliteal fossa. Both of these were cultured. She had x-rays I think at the insistence of her insurance company on 09/23/16 x-ray of the pelvis was not particularly helpful she did have soft tissue air over the right lower pelvis although with the depth of this wound this is not surprising. An x-ray of her left knee did not show any specific abnormalities. We are still using silver alginate to these wound areas. Her MRI is booked for 10/27 10/26/16; cultures of the purulent drainage in her right initial tuberosity wound grew  moderate Proteus and few staph aureus. The same organisms were cultured out of the left knee sinus tract posteriorly. The staph aureus is MRSA. I had started her on Augmentin last week I added doxycycline. The MRI of the left lower extremity and pelvis was finally done. The MRI of the femur showed subcutaneous soft tissue swelling Belshe, Djuana L. ([id number]) edema fluid and myositis in the vastus lateralis muscle but no soft tissue abscess septic arthritis or osteomyelitis. MRI of the pelvis showed the left wound to be more expensive extending down to the bone there was osteomyelitis. Left hamstring tendons were also involved. No septic arthritis involving the hip. The decubitus ulcer on the right side showed no definite osteomyelitis or abscess.. The right hip wound is actually the one the probes 6 cm downward. But the MRI showing infection including osteomyelitis on the left explains the draining sinus in the popliteal fossa on the left. She did have antibiotics in the hospitalization last time and this extended into her nursing home stay but I'm not exactly sure what antibiotics and for what duration. According the patient this did include vancomycin with considerable effort of our staff we are able to get the patient into see Dr. Sampson Goon today. There were transportation difficulties. Her mother had open heart surgery and is in the ICU in [place] therefore her brother was unable to transport. Dr. Jarrett Ables office  graciously arranged time to see her today. From my point of view she is going to require IV vancomycin plus perhaps a third generation cephalosporin. I plan to keep her on doxycycline and Augmentin until the IV antibiotics can be arranged. 11/02/2016 - Annjeanette presents today for management of ulcers; She saw Dr. 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 She is to have the PICC line  placed today and receive the initial dose for both antibiotics today. She has yet to receive the offloading chair cushion and/or mattress overlay from home health, apparently this has been a 3 week process. I encouraged her to speak to home health regarding this matter, along with offering home health to contact the wouns care center with any questions or concerns. The left ischial pressure ulcer continues to imporve, while to right ischial ulcer has increased in depth. The popliteal fossa sinus tract remains unmeasurable due to the limitation of depth measurement (tract extends beyond our measuring devices). 11-16-2016 Ms. Blessinger presents today for evaluation and management of bilateral ischial stage IV pressure ulcers and sinus tract to the left popliteal fossa. she is under the care of Dr. Sampson Goon for IV antibiotic therapy; she states that the vancomycin was placed on hold and will be restarted at a lower dose based on her renal function. She continues taking Zosyn in addition to the vancomycin. She also states that she has yet to receive offloading cushions from home health, according to her she does not qualify for these offloading cushions because "the ulcers are unstageable ". We will contact the home health agency today to lend clarity regarding her pressure ulcers. The left popliteal fossa sinus tract continues to be a measurable as it extends beyond the length of our measuring devices.. 11/30/16; the patient is still on vancomycin and Zosyn. The depth of the draining sinus behind her left popliteal fossa is down to 4 cm although there is still serosanguineous drainage coming out of this. She saw Dr. Sampson Goon of infectious disease yesterday the idea is to weeks more of IV antibiotics and then oral antibiotics although I have not read his note. The area on the left gluteal fold is just about healed. She has a 6 cm draining sinus over the right initial tuberosity although I cannot feel  bone at the base of this. As far as the patient is aware she has not had a recheck of her inflammatory markers. 12/07/16; patient is on vancomycin and Zosyn appointment with Dr. Sampson Goon on the 19th at which point the patient expects to have a change in antibiotics. Remarkable improvement over the wound over the left ischial tuberosity which is just about closed. The draining sinus in her popliteal fossa has 0.4 cm in depth. The area on the right ischial tuberosity still probes down 7 cm. This is closed and overall wound dimensions but not depth. 12/14/16; patient is completing her vancomycin and Zosyn and per her she is going to transition to Bactrim and Augmentin for another 3 weeks. The area on her left gluteal fold is closed except for some skin tears. The area behind her left knee is no longer has any depth. The only remaining area that is of clinical concern is on the right gluteal fold probing towards the right ischial tuberosity. Today this measures 6.9 cm in depth. Very gritty surface 12/21/16; patient is now on Bactrim and Augmentin as directed by infectious disease. This should be for another  2 weeks. The area in her left gluteal fold and left popliteal are closed over and fully healed. Vanwieren, Magdaline L. ([id number]) Measurements today at 7 cm in the right buttock wound is unchanged from last week. 12/27/16; patient is on Bactrim and Augmentin for another week as directed by infectious disease. She has completed her IV antibiotics. She is not been systemically unwell no fever no chills. The area on her right buttock measured over 6 cm in depth. There is no palpable bone. No evidence of surrounding soft tissue infection. She is complaining of tongue irritation and has a history of thrush 01/04/17; patient is been back to see Dr. Sampson Goon, her Augmentin was stopped but he continued the Bactrim for another 3 weeks. Depth of the wound is 6.7 cm there is been no major change in  either direction. She is not receive the wound VAC from home health I think because of confusion about who is supposed to provided will actually talk to the home health agency today [[facility] The net no major change. 01/18/17; patient obtained her wound VAC about 10 days ago however for some reason it was not actually put on the wound. She is therefore here for [place] to apply this I guess. No other issues are noted. She is not complaining of pain fever drainage 02/01/17; patient is here now having the wound VAC or 3-4 weeks to a deep pressure area over the right initial tuberosity. This measures 6 cm in depth today which is about half a centimeter better than 2 weeks ago. There is no evidence she is systemically unwell no fever no chills no pain around the area. 02/15/17; I follow this patient every 2 weeks for a deep area over the right ischial tuberosity. This measures 5.5 cm today which is a continued improvement of 1.2 cm from 1/10 and down 0.5 cm from her visit 2 weeks ago 03/01/17; continued difficult area over the right initial tuberosity using the wound VAC. Depth today of 5.1 cm which is improved. Does not appear to be a lot of drainage in the canister. Antibiotics were finally stopped by Dr. Sampson Goon bactrim[]  and inflammatory markers have been repeated 03/15/17; fall this lady every 2 weeks for a difficult area over her lower right gluteal area/ischial tuberosity. Depth today at 4.6 cm. This is a slow but steady improvement in the depth of this wound. Although we have labeled this as a pressure ulcer there may have been an underlying infection here at one point before we saw her. She had osteomyelitis on the left extensively which is since resolved 03/29/17- patient is here for follow-up evaluation of her right ischial pressure ulcer. She continues with the wound VAC and home health. According to the nurse home health has been using less foams and appropriate and we will instruct accordingly.  The patient continues to smoke, approximately 10 cigarettes a day. She has been advised to decrease that in half by her next appointment, with a goal of complete cessation. She states her blood sugars have been consistently less than 150. She states that she spends most of her day position left lateral or prone. She does have an air mattress on her bed, she does not have an mattress for her chair, she states she cannot afford this. 04/12/17; patient is here for evaluation of her right ischial pressure ulcer. We continue to use a wound VAC with minimal improvement today the depth of this measuring 4.4 cm versus 4.6 cm 2 weeks ago. We are using  a [employer] wound VAC on this area. There is not excessive drainage no pain. The patient tells me she tries to keep off this in bed but is up in the wheelchair for 2 hours a day. She is limited in no her overall ambulation but is improving and apparently is getting bilateral lower extremity braces which she hopes will improve her ability to walk independently. 05/10/17; Depth at 3.3cm. Improved 05/24/17; depth at 3.5 cm. This is not improved since last time. Not clear if they are using collagen under the foam 06/07/17; depth that 2.9 which is a slight improvement. Still using the wound VAC. 06/21/17; depth is 2.9 cm which is exactly the same as last time. Also the appearance of this wound is completely the same. We have been using silver collagen under a wound VAC. 07/05/17 patient presents today for reevaluation concerning her right Ischial wound. She had switched insurance companies and so it does appear that the Wound VAC needs to be reauthorized which we are working on this morning. Nonetheless her wound has been saying about the same we have continued to use the silver collagen underneath the wound VAC. She has no discomfort. 07/19/17; patient follows every 2 weeks for her right if she'll wound. Since I last saw this a month ago her dimensions of come down to 2.6  cm. This is slightly down from a month ago when it was 2.9 cm in 4 Mcintyre, Alda L. ([id number]) months ago it was 4.6 cm. I note that there were insurance company issues with regard to the wound VAC which she apparently is not having on for several weeks now. I think it would be reasonable to change therapies here. Will use silver alginate rope 08/02/17 on evaluation today patient's wound actually appears to be doing significantly better in regard to the depth as compared to her previous evaluation. She is having no discomfort. Unfortunately she never got the Wound VAC reapproved following the insurance issues from several weeks ago. With that being said it does not appear that she needs to requires the Wound VAC anymore and in fact I feel that she is doing better and making greater improvements at this point without it. Fortunately she has no nausea, vomiting, diarrhea, fevers, or chills. 08/16/17; patient's wound depth down to 2.3 cm. She is using silver alginate packing rope. Note that her wound VAC was discontinued due to insurance issues. This is not particularly surprising. She has not been systemically unwell and has no other new complaints Electronic Signature(s) Signed: 08/16/2017 5:02:24 PM By: Baltazar Najjar MD Entered By: Baltazar Najjar on 08/16/2017 08:23:30 Branscomb, [name] ([id number]) -------------------------------------------------------------------------------- Physical Exam Details Patient Name: [name] Date of Service: 08/16/2017 8:00 AM Medical Record Patient Account Number: [id number] Number: Treating RN: Phillis Haggis [birth year] (56 y.o. Other Clinician: Date of Birth/Sex: Female) Treating Tandi Hanko Primary Care Provider: Joen [name] Provider/Extender: G Referring Provider: Tedra Senegal in Treatment: 74 Constitutional Sitting or standing Blood Pressure is within target range for patient.. Pulse regular and within target range for  patient.Marland Kitchen Respirations regular, non-labored and within target range.. Temperature is normal and within the target range for the patient.Marland Kitchen appears in no distress. Notes Wound exam; once again the wound requires debridement of tightly adherent surface material which cannot be really visualized in this 2.3 cm tunnel. I've been debriding this in order to facilitate some degree of closure reached 2 weeks. There is no surrounding soft tissue crepitus no erythema. Electronic Signature(s) Signed: 08/16/2017 5:02:24  PM By: Baltazar Najjar MD Entered By: Baltazar Najjar on 08/16/2017 08:25:16 Dopson, [name] ([id number]) -------------------------------------------------------------------------------- Physician Orders Details Patient Name: [name] Date of Service: 08/16/2017 8:00 AM Medical Record Patient Account Number: [id number] Number: Treating RN: Phillis Haggis 12-Mar-1961 (56 y.o. Other Clinician: Date of Birth/Sex: Female) Treating Peaches Vanoverbeke Primary Care Provider: Joen [name] Provider/Extender: G Referring Provider: Tedra Senegal in Treatment: 55 Verbal / Phone Orders: Yes Clinician: Ashok Cordia, Debi Read Back and Verified: Yes Diagnosis Coding ICD-10 Coding Code Description L89.314 Pressure ulcer of right buttock, stage 4 E11.42 Type 2 diabetes mellitus with diabetic polyneuropathy Wound Cleansing Wound #3 Right Gluteal fold o Clean wound with Normal Saline. o Cleanse wound with mild soap and water Skin Barriers/Peri-Wound Care Wound #3 Right Gluteal fold o Skin Prep Primary Wound Dressing Wound #3 Right Gluteal fold o Other: - SilverCel non-adherent rope lightly pack in wound Secondary Dressing Wound #3 Right Gluteal fold o Dry Gauze o Boardered Foam Dressing Dressing Change Frequency Wound #3 Right Gluteal fold o Three times weekly Follow-up Appointments Wound #3 Right Gluteal fold o Return Appointment in 2  weeks. Off-Loading Marcel, Raileigh L. ([id number]) Wound #3 Right Gluteal fold o Turn and reposition every 2 hours Additional Orders / Instructions Wound #3 Right Gluteal fold o Increase protein intake. Home Health Wound #3 Right Gluteal fold o Continue Home Health Visits - [facility] Nurse may visit PRN to address patientos wound care needs. o FACE TO FACE ENCOUNTER: MEDICARE and MEDICAID PATIENTS: I certify that this patient is under my care and that I had a face-to-face encounter that meets the physician face-to-face encounter requirements with this patient on this date. The encounter with the patient was in whole or in part for the following MEDICAL CONDITION: (primary reason for Home Healthcare) MEDICAL NECESSITY: I certify, that based on my findings, NURSING services are a medically necessary home health service. HOME BOUND STATUS: I certify that my clinical findings support that this patient is homebound (i.e., Due to illness or injury, pt requires aid of supportive devices such as crutches, cane, wheelchairs, walkers, the use of special transportation or the assistance of another person to leave their place of residence. There is a normal inability to leave the home and doing so requires considerable and taxing effort. Other absences are for medical reasons / religious services and are infrequent or of short duration when for other reasons). o If current dressing causes regression in wound condition, may D/C ordered dressing product/s and apply Normal Saline Moist Dressing daily until next [facility]aling Center / Other MD appointment. Notify Wound Healing Center of regression in wound condition at [phone]. o Please direct any NON-WOUND related issues/requests for orders to patient's Primary Care Physician Electronic Signature(s) Signed: 08/16/2017 4:59:51 PM By: Alejandro Mulling Signed: 08/16/2017 5:02:24 PM By: Baltazar Najjar MD Entered By:  Alejandro Mulling on 08/16/2017 08:28:37 Fassnacht, [name] ([id number]) -------------------------------------------------------------------------------- Problem List Details Patient Name: [name] Date of Service: 08/16/2017 8:00 AM Medical Record Patient Account Number: [id number] Number: Treating RN: Phillis Haggis Oct 13, 1961 (56 y.o. 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 09/21/2016 09/21/2016 severity Resolved Problems ICD-10 Code Description Active Date Resolved Date L89.323 Pressure ulcer of left buttock, stage 3 09/21/2016 09/21/2016 L89.324 Pressure ulcer of left buttock, stage 4 11/16/2016 11/16/2016 Electronic Signature(s) Signed: 08/16/2017 5:02:24 PM By: Baltazar Najjar MD Ciocca, [name] ([id number]) Entered By: Baltazar Najjar on 08/16/2017 08:21:11 Espindola, [name] ([id number]) -------------------------------------------------------------------------------- Progress Note Details Patient Name: [name] Date of Service: 08/16/2017 8:00 AM Medical Record Patient Account Number: [id number] Number: Treating RN: Phillis Haggis 1961-11-26 (56 y.o. Other Clinician: Date of Birth/Sex: Female) Treating Yann Biehn Primary Care Provider: Joen [name] Provider/Extender: G Referring Provider: Tedra Sen[place] in Treatment: 47 Subjective History of Present Illness (HPI) The following HPI elements  were documented for the patient's wound: Location: bilateral gluteal ulcerations and left popliteal ulceration with tunneling Quality: adits to intermittent aching to ischial ulcers, no discomfort to sinus tract Severity: right iscial ulcer with increased depth Duration: chronic ulcers to bilateral ischial ulcers and sinus tract Timing: The pain is intermittent in severity as far as how intense it becomes but is present all the time. Manipulationn makes this worse. Context: The wound occurred when the patient had a fall and was unconscious for about 48 hours laying on the floor and she had pressure injury at that stage. Modifying Factors: Other treatment(s) tried include:as noted below she has been seen by visiting wound care physicians or nurse practitioners and details have been noted Associated Signs and Symptoms: has yet to receive offloading cushions and mattress overlay 56 year old patient who was seen by visiting [name] wound care specialist for a wound on both her buttock and was found to have an unstageable wound on the right buttock for about 2 months. I understand that she had a fall and was laying on the floor for about 48 hours before she was found and taken to the ICU and had a long injury to her gluteal area from pressure and also had broken her right humerus. She has had a right proximal humerus fracture and has been followed up with orthopedics recently. The patient has a past medical history of type 2 diabetes mellitus, paraparesis, acute pyelonephritis, GERD, hypertension, glaucoma, chronic pain, anxiety neurosis, nicotine dependence, COPD. the patient had some debridement done and was to operative was recommended to use Silvadene dressing and offloading. She is a smoker and occasionally smokes a few cigarettes. the patient requested a second opinion for months and is here to discuss her care. 09/21/16; the patient re-presents from home today for review of 3 different wounds.  I note that she was seen in the clinic here in July at which time she had bilateral buttock wounds. It was apparently suggested at that time that she use a wound VAC bridged to both wounds just near the initial tuberosity's bilaterally which she refused. The history was a bit difficult to put together. Apparently this patient became ill at the end of April of this year. She was found sitting on the floor she had apparently been for 2 days and subsequently admitted to hospital from 04/23/16 through 05/02/16 and at that point she was critically ill ultimately having sepsis secondary to UTI, nontraumatic rhabdomyolysis and diabetic ketoacidosis. She had acute renal failure and I think required ICU care including intubation. Patient states her wounds actually started at that point on the bilateral issue tuberosities however in reviewing the discharge summary from 5/8 I see no reference to Guadarrama, Matayah L. ([id number])  wounds at that point. It did state that she had left lower extremity cellulitis however. Reviewing Epic I see no relevant x-rays. It would appear that her discharge creatinine was within the normal range and indeed on 9/15 her creatinine has remained normal. [facility] There the wounds on her bilateral Buttocks were dressed. Only just before her discharge from the nursing facility she developed an "knot" which was interpreted as cellulitis on the posterior aspect of her left knee she was given antibiotics. Apparently sometime late in July a this actually opened and became a wound at home health care was tending to however she is still having purulent drainage coming from this and by my understanding the wound depth is actually become unmeasurable. I am not really clear about what home health has been placing in any of these wound areas. The patient states that is something with silver and it. She is not been systemically unwell no  fever or chills her appetite is good. She is a diabetic poorly controlled however she states that her recent blood sugars at home have been in the low to mid 100s. 09/28/16 On evaluation today patient appears to continue to exhibit the 3 areas of ulceration that were noted previous. She did have an x-ray of the right pelvis which showed evidence of potential soft tissue infection but no obvious osteomyelitis. There was a discussion last office visit concerning the possibility of a wound VAC. Witth that being said the x-ray report suggested that an MRI may be more appropriate to further evaluate the extent. Subsequently in regard to the wound over the popliteal portion of the left lower extremity with tunneling at 12:00 the CT scan that was ordered was denied by insurance as they state the patient has not had x-rays prior to advanced imaging. Patient states that she is frustrated with the situation overall. 10/05/16 in the interval since I last saw this patient last week she has had the x-ray of the knee performed. I did review that x-ray today and fortunately shows no evidence of osteomyelitis or other acute abnormality at this point in time. She continues to have the opening iin the posterior left popliteal space with tracking proximal up the posterior thigh. Nothing seems to have worsened but it also seems to have not improved. The same is true in regard to the right pressure ulcer over the gluteal region which extends toward the ischium. The left gluteal pressure ulcer actually appears to be doing somewhat better my opinion there is some necrotic slough but overall this appears fairly well. She tells me thatt she has some discomfort especially when home health is helping her with dressing changes as they do not know her. At worse she rates her pain to be a 5 out of 10 right now it's more like a 1 out of 10. 10/07/16; still the patient has 3 different wound areas. She has a deep stage IV wound  over her right ischial tuberosity. She is due to have an MRI next week. The wound over her left ischial tuberosity is more superficial and underwent debridement today. Finally she has a small open area in her left popliteal fossa the probes on measurably forward superiorly. Still a lot of drainage coming out of this. The CT scan that I ordered 3 weeks ago was questioned by her insurance company wanting a plain x-ray first. As I understand things result of this is nothing has been done in 3 weeks  in terms of imaging the thigh and she has an MRI booked of this along with her pelvis for next week line 10/12/16; patient has a deep probing wound over the right ischiall tuberosity, stage III wound over the left visual tuberosity and a draining sinus in her left popliteal fossa. None of this much different from when I saw this 3 weeks ago. We have been using silver rope to the right ischial wound and a draining area in the left popliteal fossa. Plain silver alginate to the area on the left ischial tuberosity 10/19/16; the patient's wounds are essentially unchanged although the area on the left lower gluteal is actually improved. Our intake nurse noted drainage from the right initial tuberosity probing wound as well as the draining area in the left popliteal fossa. Both of these were cultured. She had x-rays I think at the insistence of her insurance company on 09/23/16 x-ray of the pelvis was not particularly helpful she did have soft tissue air over the right lower pelvis although with the depth of this wound this is not surprising. An x-ray of her left knee did not show any specific abnormalities. We are still using silver alginate to these wound areas. Her MRI is booked for 10/27 10/26/16; cultures of the purulent drainage in her right initial tuberosity wound grew moderate Proteus and Zell, Kimiyo L. ([id number]) few staph aureus. The same organisms were cultured out of the left knee sinus tract  posteriorly. The staph aureus is MRSA. I had started her on Augmentin last week I added doxycycline. The MRI of the left lower extremity and pelvis was finally done. The MRI of the femur showed subcutaneous soft tissue swelling edema fluid and myositis in the vastus lateralis muscle but no soft tissue abscess septic arthritis or osteomyelitis. MRI of the pelvis showed the left wound to be more expensive extending down to the bone there was osteomyelitis. Left hamstring tendons were also involved. No septic arthritis involving the hip. The decubitus ulcer on the right side showed no definite osteomyelitis or abscess.. The right hip wound is actually the one the probes 6 cm downward. But the MRI showing infection including osteomyelitis on the left explains the draining sinus in the popliteal fossa on the left. She did have antibiotics in the hospitalization last time and this extended into her nursing home stay but I'm not exactly sure what antibiotics and for what duration. According the patient this did include vancomycin with considerable effort of our staff we are able to get the patient into see Dr. Sampson Goon today. There were transportation difficulties. Her mother had open heart surgery and is in the ICU in [place] therefore her brother was unable to transport. Dr. Jarrett Ables office graciously arranged time to see her today. From my point of view she is going to require IV vancomycin plus perhaps a third generation cephalosporin. I plan to keep her on doxycycline and Augmentin until the IV antibiotics can be arranged. 11/02/2016 - Gwenette presents today for management of ulcers; She saw Dr. 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 She is to have the PICC line placed today and receive the initial dose for both antibiotics today. She has yet to receive the offloading chair cushion and/or  mattress overlay from home health, apparently this has been a 3 week process. I encouraged her to speak to home health regarding this matter, along with offering home health to contact the wouns care  center with any questions or concerns. The left ischial pressure ulcer continues to imporve, while to right ischial ulcer has increased in depth. The popliteal fossa sinus tract remains unmeasurable due to the limitation of depth measurement (tract extends beyond our measuring devices). 11-16-2016 Ms. Erisman presents today for evaluation and management of bilateral ischial stage IV pressure ulcers and sinus tract to the left popliteal fossa. she is under the care of Dr. Sampson Goon for IV antibiotic therapy; she states that the vancomycin was placed on hold and will be restarted at a lower dose based on her renal function. She continues taking Zosyn in addition to the vancomycin. She also states that she has yet to receive offloading cushions from home health, according to her she does not qualify for these offloading cushions because "the ulcers are unstageable ". We will contact the home health agency today to lend clarity regarding her pressure ulcers. The left popliteal fossa sinus tract continues to be a measurable as it extends beyond the length of our measuring devices.. 11/30/16; the patient is still on vancomycin and Zosyn. The depth of the draining sinus behind her left popliteal fossa is down to 4 cm although there is still serosanguineous drainage coming out of this. She saw Dr. Sampson Goon of infectious disease yesterday the idea is to weeks more of IV antibiotics and then oral antibiotics although I have not read his note. The area on the left gluteal fold is just about healed. She has a 6 cm draining sinus over the right initial tuberosity although I cannot feel bone at the base of this. As far as the patient is aware she has not had a recheck of her inflammatory markers. 12/07/16; patient  is on vancomycin and Zosyn appointment with Dr. Sampson Goon on the 19th at which point the patient expects to have a change in antibiotics. Remarkable improvement over the wound over the left ischial tuberosity which is just about closed. The draining sinus in her popliteal fossa has 0.4 cm in depth. The area on the right ischial tuberosity still probes down 7 cm. This is closed and overall wound dimensions but not depth. 12/14/16; patient is completing her vancomycin and Zosyn and per her she is going to transition to Bactrim and Augmentin for another 3 weeks. The area on her left gluteal fold is closed except for some skin tears. The area behind her left knee is no longer has any depth. The only remaining area that is of clinical concern is on the right gluteal fold probing towards the right ischial tuberosity. Today this measures 6.9 cm in depth. Pesola, Liesel L. ([id number]) Very gritty surface 12/21/16; patient is now on Bactrim and Augmentin as directed by infectious disease. This should be for another 2 weeks. The area in her left gluteal fold and left popliteal are closed over and fully healed. Measurements today at 7 cm in the right buttock wound is unchanged from last week. 12/27/16; patient is on Bactrim and Augmentin for another week as directed by infectious disease. She has completed her IV antibiotics. She is not been systemically unwell no fever no chills. The area on her right buttock measured over 6 cm in depth. There is no palpable bone. No evidence of surrounding soft tissue infection. She is complaining of tongue irritation and has a history of thrush 01/04/17; patient is been back to see Dr. Sampson Goon, her Augmentin was stopped but he continued the Bactrim for another 3 weeks. Depth of the wound is 6.7 cm there is  been no major change in either direction. She is not receive the wound VAC from home health I think because of confusion about who is supposed to provided will actually  talk to the home health agency today [[facility] The net no major change. 01/18/17; patient obtained her wound VAC about 10 days ago however for some reason it was not actually put on the wound. She is therefore here for [place] to apply this I guess. No other issues are noted. She is not complaining of pain fever drainage 02/01/17; patient is here now having the wound VAC or 3-4 weeks to a deep pressure area over the right initial tuberosity. This measures 6 cm in depth today which is about half a centimeter better than 2 weeks ago. There is no evidence she is systemically unwell no fever no chills no pain around the area. 02/15/17; I follow this patient every 2 weeks for a deep area over the right ischial tuberosity. This measures 5.5 cm today which is a continued improvement of 1.2 cm from 1/10 and down 0.5 cm from her visit 2 weeks ago 03/01/17; continued difficult area over the right initial tuberosity using the wound VAC. Depth today of 5.1 cm which is improved. Does not appear to be a lot of drainage in the canister. Antibiotics were finally stopped by Dr. Sampson Goon bactrim[]  and inflammatory markers have been repeated 03/15/17; fall this lady every 2 weeks for a difficult area over her lower right gluteal area/ischial tuberosity. Depth today at 4.6 cm. This is a slow but steady improvement in the depth of this wound. Although we have labeled this as a pressure ulcer there may have been an underlying infection here at one point before we saw her. She had osteomyelitis on the left extensively which is since resolved 03/29/17- patient is here for follow-up evaluation of her right ischial pressure ulcer. She continues with the wound VAC and home health. According to the nurse home health has been using less foams and appropriate and we will instruct accordingly. The patient continues to smoke, approximately 10 cigarettes a day. She has been advised to decrease that in half by her next appointment, with a  goal of complete cessation. She states her blood sugars have been consistently less than 150. She states that she spends most of her day position left lateral or prone. She does have an air mattress on her bed, she does not have an mattress for her chair, she states she cannot afford this. 04/12/17; patient is here for evaluation of her right ischial pressure ulcer. We continue to use a wound VAC with minimal improvement today the depth of this measuring 4.4 cm versus 4.6 cm 2 weeks ago. We are using a [employer] wound VAC on this area. There is not excessive drainage no pain. The patient tells me she tries to keep off this in bed but is up in the wheelchair for 2 hours a day. She is limited in no her overall ambulation but is improving and apparently is getting bilateral lower extremity braces which she hopes will improve her ability to walk independently. 05/10/17; Depth at 3.3cm. Improved 05/24/17; depth at 3.5 cm. This is not improved since last time. Not clear if they are using collagen under the foam 06/07/17; depth that 2.9 which is a slight improvement. Still using the wound VAC. 06/21/17; depth is 2.9 cm which is exactly the same as last time. Also the appearance of this wound is completely the same. We have been using silver  collagen under a wound VAC. 07/05/17 patient presents today for reevaluation concerning her right Ischial wound. She had switched insurance companies and so it does appear that the Wound VAC needs to be reauthorized which we are working on this morning. Nonetheless her wound has been saying about the same we have continued to Plog, Carline L. ([id number]) use the silver collagen underneath the wound VAC. She has no discomfort. 07/19/17; patient follows every 2 weeks for her right if she'll wound. Since I last saw this a month ago her dimensions of come down to 2.6 cm. This is slightly down from a month ago when it was 2.9 cm in 4 months ago it was 4.6 cm. I note that there were  insurance company issues with regard to the wound VAC which she apparently is not having on for several weeks now. I think it would be reasonable to change therapies here. Will use silver alginate rope 08/02/17 on evaluation today patient's wound actually appears to be doing significantly better in regard to the depth as compared to her previous evaluation. She is having no discomfort. Unfortunately she never got the Wound VAC reapproved following the insurance issues from several weeks ago. With that being said it does not appear that she needs to requires the Wound VAC anymore and in fact I feel that she is doing better and making greater improvements at this point without it. Fortunately she has no nausea, vomiting, diarrhea, fevers, or chills. 08/16/17; patient's wound depth down to 2.3 cm. She is using silver alginate packing rope. Note that her wound VAC was discontinued due to insurance issues. This is not particularly surprising. She has not been systemically unwell and has no other new complaints Objective Constitutional Sitting or standing Blood Pressure is within target range for patient.. Pulse regular and within target range for patient.Marland Kitchen Respirations regular, non-labored and within target range.. Temperature is normal and within the target range for the patient.Marland Kitchen appears in no distress. Vitals Time Taken: 8:06 AM, Height: 63 in, Weight: 257 lbs, BMI: 45.5, Temperature: 98.3 F, Pulse: 89 bpm, Respiratory Rate: 18 breaths/min, Blood Pressure: 133/68 mmHg. General Notes: Wound exam; once again the wound requires debridement of tightly adherent surface material which cannot be really visualized in this 2.3 cm tunnel. I've been debriding this in order to facilitate some degree of closure reached 2 weeks. There is no surrounding soft tissue crepitus no erythema. Integumentary (Hair, Skin) Wound #3 status is Open. Original cause of wound was Pressure Injury. The wound is located on the  Right Gluteal fold. The wound measures 0.5cm length x 0.7cm width x 2.3cm depth; 0.275cm^2 area and 0.632cm^3 volume. There is Fat Layer (Subcutaneous Tissue) Exposed exposed. There is no tunneling or undermining noted. There is a large amount of serous drainage noted. The wound margin is distinct with the outline attached to the wound base. There is medium (34-66%) red granulation within the wound bed. There is a medium (34-66%) amount of necrotic tissue within the wound bed including Adherent Slough. The periwound skin appearance exhibited: Scarring, Maceration. The periwound skin appearance did not exhibit: Callus, Crepitus, Excoriation, Induration, Rash, Dry/Scaly, Atrophie Blanche, Cyanosis, Ecchymosis, Hemosiderin Staining, Mottled, Pallor, Rubor, Erythema. Periwound temperature was noted as Rost, Aliany L. ([id number]) No Abnormality. The periwound has tenderness on palpation. Assessment Active Problems ICD-10 L89.314 - Pressure ulcer of right buttock, stage 4 E11.42 - Type 2 diabetes mellitus with diabetic polyneuropathy Procedures Wound #3 Pre-procedure diagnosis of Wound #3 is a Pressure Ulcer located on  the Right Gluteal fold . There was a Skin/Subcutaneous Tissue Debridement ([id number]) debridement with total area of 0.35 sq cm performed by Maxwell Caul, MD. with the following instrument(s): scoop to remove Viable and Non-Viable tissue/material including Exudate, Fibrin/Slough, and Subcutaneous after achieving pain control using Lidocaine 4% Topical Solution. A time out was conducted at 08:16, prior to the start of the procedure. A Minimum amount of bleeding was controlled with Pressure. The procedure was tolerated well with a pain level of 0 throughout and a pain level of 0 following the procedure. Post Debridement Measurements: 0.6cm length x 0.8cm width x 2.3cm depth; 0.867cm^3 volume. Post debridement Stage noted as Category/Stage IV. Character of Wound/Ulcer Post  Debridement requires further debridement. Post procedure Diagnosis Wound #3: Same as Pre-Procedure Plan Wound Cleansing: Wound #3 Right Gluteal fold: Clean wound with Normal Saline. Cleanse wound with mild soap and water Skin Barriers/Peri-Wound Care: Wound #3 Right Gluteal fold: Skin Prep Primary Wound Dressing: Wound #3 Right Gluteal fold: Barros, Sherlin L. ([id number]) Other: - SilverCel non-adherent rope lightly pack in wound Secondary Dressing: Wound #3 Right Gluteal fold: Dry Gauze Boardered Foam Dressing Dressing Change Frequency: Wound #3 Right Gluteal fold: Three times weekly Follow-up Appointments: Wound #3 Right Gluteal fold: Return Appointment in 2 weeks. Off-Loading: Wound #3 Right Gluteal fold: Turn and reposition every 2 hours Additional Orders / Instructions: Wound #3 Right Gluteal fold: Increase protein intake. Home Health: Wound #3 Right Gluteal fold: Continue Home Health Visits - [facility] Nurse may visit PRN to address patient s wound care needs. FACE TO FACE ENCOUNTER: MEDICARE and MEDICAID PATIENTS: I certify that this patient is under my care and that I had a face-to-face encounter that meets the physician face-to-face encounter requirements with this patient on this date. The encounter with the patient was in whole or in part for the following MEDICAL CONDITION: (primary reason for Home Healthcare) MEDICAL NECESSITY: I certify, that based on my findings, NURSING services are a medically necessary home health service. HOME BOUND STATUS: I certify that my clinical findings support that this patient is homebound (i.e., Due to illness or injury, pt requires aid of supportive devices such as crutches, cane, wheelchairs, walkers, the use of special transportation or the assistance of another person to leave their place of residence. There is a normal inability to leave the home and doing so requires considerable and taxing effort. Other  absences are for medical reasons / religious services and are infrequent or of short duration when for other reasons). If current dressing causes regression in wound condition, may D/C ordered dressing product/s and apply Normal Saline Moist Dressing daily until next Wound Healing Center / Other MD appointment. Notify Wound Healing Center of regression in wound condition at [phone]. Please direct any NON-WOUND related issues/requests for orders to patient's Primary Care Physician continue silver alginate packing rigorous offloading Electronic Signature(s) Signed: 08/16/2017 8:31:54 AM By: Baltazar Najjar MD Entered By: Baltazar Najjar on 08/16/2017 08:31:54 Talarico, Delaynee L. ([id number]) Stump, [name] ([id number]) -------------------------------------------------------------------------------- SuperBill Details Patient Name: [name] Date of Service: 08/16/2017 Medical Record Patient Account Number: [id number] Number: Treating RN: Phillis Haggis [birth year] (56 y.o. 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

## 2017-08-30 ENCOUNTER — Encounter: Payer: BLUE CROSS/BLUE SHIELD | Attending: Internal Medicine | Admitting: Internal Medicine

## 2017-08-30 DIAGNOSIS — J449 Chronic obstructive pulmonary disease, unspecified: Secondary | ICD-10-CM | POA: Insufficient documentation

## 2017-08-30 DIAGNOSIS — F411 Generalized anxiety disorder: Secondary | ICD-10-CM | POA: Diagnosis not present

## 2017-08-30 DIAGNOSIS — I10 Essential (primary) hypertension: Secondary | ICD-10-CM | POA: Diagnosis not present

## 2017-08-30 DIAGNOSIS — L89314 Pressure ulcer of right buttock, stage 4: Secondary | ICD-10-CM | POA: Diagnosis not present

## 2017-08-30 DIAGNOSIS — E1142 Type 2 diabetes mellitus with diabetic polyneuropathy: Secondary | ICD-10-CM | POA: Diagnosis not present

## 2017-08-30 DIAGNOSIS — K219 Gastro-esophageal reflux disease without esophagitis: Secondary | ICD-10-CM | POA: Diagnosis not present

## 2017-08-30 DIAGNOSIS — F17210 Nicotine dependence, cigarettes, uncomplicated: Secondary | ICD-10-CM | POA: Diagnosis not present

## 2017-09-01 NOTE — Progress Notes (Signed)
Katelyn, [name] ([id number]) Visit Report for 08/30/2017 Arrival Information Details Patient Name: [name], [name] Date of Service: 08/30/2017 8:00 AM Medical Record Patient Account Number: [id number] Number: Treating RN: Ahmed Prima 1961/10/10 (56 y.o. Other Clinician: Date of Birth/Sex: Female) Treating ROBSON, MICHAEL Primary Care Cottrell Gentles: Lavera Guise Jove Beyl/Extender: G Referring Keyonna Comunale: Sallee Lange in Treatment: 88 Visit Information History Since Last Visit All ordered tests and consults were completed: No Patient Arrived: Wheel Chair Added or deleted any medications: No Arrival Time: 08:10 Any new allergies or adverse reactions: No Accompanied By: self Had a fall or experienced change in No Transfer Assistance: EasyPivot activities of daily living that may affect Patient Lift risk of falls: Patient Identification Verified: Yes Signs or symptoms of abuse/neglect since last No Secondary Verification Process Yes visito Completed: Hospitalized since last visit: No Patient Requires Transmission- No Has Dressing in Place as Prescribed: Yes Based Precautions: Pain Present Now: No Patient Has Alerts: Yes Patient Alerts: DM II Electronic Signature(s) Signed: 08/30/2017 5:43:38 PM By: Alric Quan Entered By: Alric Quan on 08/30/2017 08:10:44 Brilliant, [name] ([id number]) -------------------------------------------------------------------------------- Encounter Discharge Information Details Patient Name: [name] Date of Service: 08/30/2017 8:00 AM Medical Record Patient Account Number: [id number] Number: Treating RN: Ahmed Prima 16-Nov-1961 (56 y.o. Other Clinician: Date of Birth/Sex: Female) Treating ROBSON, MICHAEL Primary Care Agata Lucente: Lavera Guise Gunther Zawadzki/Extender: G Referring Sayre Witherington: Sallee Lange in Treatment: 31 Encounter Discharge Information Items Discharge Pain Level: 0 Discharge Condition:  Stable Ambulatory Status: Wheelchair Discharge Destination: Home Transportation: Other Accompanied By: self Schedule Follow-up Appointment: Yes Medication Reconciliation completed No and provided to Patient/Care Skyley Grandmaison: Patient Clinical Summary of Care: Declined Electronic Signature(s) Signed: 08/31/2017 9:07:34 AM By: Ruthine Dose Entered By: Ruthine Dose on 08/30/2017 08:38:15 [name] ([id number]) -------------------------------------------------------------------------------- Lower Extremity Assessment Details Patient Name: [name] Date of Service: 08/30/2017 8:00 AM Medical Record Patient Account Number: [id number] Number: Treating RN: Ahmed Prima 1961-11-29 (56 y.o. Other Clinician: Date of Birth/Sex: Female) Treating ROBSON, MICHAEL Primary Care  Jon: Lavera Guise Nayden Czajka/Extender: G Referring Jolanda Mccann: Sallee Lange in Treatment: 62 Electronic Signature(s) Signed: 08/30/2017 5:43:38 PM By: Alric Quan Entered By: Alric Quan on 08/30/2017 08:18:25 [name] ([id number]) -------------------------------------------------------------------------------- Multi Wound Chart Details Patient Name: [name] Date of Service: 08/30/2017 8:00 AM Medical Record Patient Account Number: [id number] Number: Treating RN: Ahmed Prima October 19, 1961 (56 y.o. Other Clinician: Date of Birth/Sex: Female) Treating ROBSON, MICHAEL Primary Care Kirrah Mustin: Lavera Guise Javel Hersh/Extender: G Referring Dehlia Kilner: Sallee Lange in Treatment: 49 Vital Signs Height(in): 63 Pulse(bpm): 92 Weight(lbs): 257 Blood Pressure 133/65 (mmHg): Body Mass Index(BMI): 46 Temperature(F): 98.3 Respiratory Rate 18 (breaths/min): Photos: [3:No Photos] [N/A:N/A] Wound Location: [3:Right Gluteal fold] [N/A:N/A] Wounding Event: [3:Pressure Injury] [N/A:N/A] Primary Etiology: [3:Pressure Ulcer] [N/A:N/A] Comorbid History: [3:Asthma,  Hypertension, Type II Diabetes, Neuropathy] [N/A:N/A] Date Acquired: [3:04/21/2016] [N/A:N/A] Weeks of Treatment: [3:49] [N/A:N/A] Wound Status: [3:Open] [N/A:N/A] Measurements L x W x D 0.3x1x2.3 [N/A:N/A] (cm) Area (cm) : [3:0.236] [N/A:N/A] Volume (cm) : [3:0.542] [N/A:N/A] % Reduction in Area: [3:92.30%] [N/A:N/A] % Reduction in Volume: 96.80% [N/A:N/A] Classification: [3:Category/Stage IV] [N/A:N/A] Exudate Amount: [3:Large] [N/A:N/A] Exudate Type: [3:Serous] [N/A:N/A] Exudate Color: [3:amber] [N/A:N/A] Foul Odor After [3:Yes] [N/A:N/A] Cleansing: Odor Anticipated Due to No [N/A:N/A] Product Use: Wound Margin: [3:Distinct, outline attached] [N/A:N/A] Granulation Amount: [3:Medium (34-66%)] [N/A:N/A] Granulation Quality: [3:Red] [N/A:N/A] Necrotic Amount: [3:Medium (34-66%)] [N/A:N/A] Exposed Structures: Fat Layer (Subcutaneous N/A N/A Tissue) Exposed: Yes Epithelialization: Small (1-33%) N/A N/A Debridement: Debridement (06237-  N/A N/A 11047) Pre-procedure 08:25 N/A N/A Verification/Time Out Taken: Pain Control: Lidocaine 4% Topical N/A N/A Solution Tissue Debrided: Fibrin/Slough, Exudates, N/A N/A Subcutaneous Level: Skin/Subcutaneous N/A N/A Tissue Debridement Area (sq 0.3 N/A N/A cm): Instrument: Other(scoop) N/A N/A Bleeding: Minimum N/A N/A Hemostasis Achieved: Pressure N/A N/A Procedural Pain: 0 N/A N/A Post Procedural Pain: 0 N/A N/A Debridement Treatment Procedure was tolerated N/A N/A Response: well Post Debridement 0.3x1x2.3 N/A N/A Measurements L x W x D (cm) Post Debridement 0.542 N/A N/A Volume: (cm) Post Debridement Category/Stage IV N/A N/A Stage: Periwound Skin Texture: Scarring: Yes N/A N/A Excoriation: No Induration: No Callus: No Crepitus: No Rash: No Periwound Skin Maceration: Yes N/A N/A Moisture: Dry/Scaly: No Periwound Skin Color: Atrophie Blanche: No N/A N/A Cyanosis: No Ecchymosis: No Erythema: No Hemosiderin  Staining: No Mottled: No Pallor: No Rubor: No Temperature: No Abnormality N/A N/A Tenderness on Yes N/A N/A Palpation: Wound Preparation: N/A N/A [name], [name] ([id number]) Ulcer Cleansing: Rinsed/Irrigated with Saline Topical Anesthetic Applied: Other: lidocaine 4% Procedures Performed: Debridement N/A N/A Treatment Notes Wound #3 (Right Gluteal fold) 1. Cleansed with: Clean wound with Normal Saline 2. Anesthetic Topical Lidocaine 4% cream to wound bed prior to debridement 3. Peri-wound Care: Skin Prep 4. Dressing Applied: Other dressing (specify in notes) 5. Secondary Dressing Applied Bordered Foam Dressing Notes SilverCel Rope Electronic Signature(s) Signed: 08/30/2017 5:15:21 PM By: Linton Ham MD Entered By: Linton Ham on 08/30/2017 08:31:12 Defrank, [name] ([id number]) -------------------------------------------------------------------------------- Multi-Disciplinary Care Plan Details Patient Name: [name] Date of Service: 08/30/2017 8:00 AM Medical Record Patient Account Number: [id number] Number: Treating RN: Ahmed Prima [birth year] (56 y.o. Other Clinician: Date of Birth/Sex: Female) Treating ROBSON, MICHAEL Primary Care Rooney Swails: Lavera Guise [place]/Extender: G Referring Angelyse Heslin: Sallee Lange in Treatment: 19 Active Inactive ` Abuse / Safety / Falls / Self Care Management Nursing Diagnoses: Potential for falls Goals: Patient will remain injury free Date Initiated: 09/21/2016 Target Resolution Date: 03/25/2017 Goal Status: Active Interventions: Assess fall risk on admission and as needed Notes: ` Nutrition Nursing Diagnoses: Imbalanced nutrition Potential for alteratiion in Nutrition/Potential for imbalanced nutrition Goals: Patient/caregiver agrees to and verbalizes understanding of need to use nutritional supplements and/or vitamins as prescribed Date Initiated: 09/21/2016 Target Resolution Date:  03/25/2017 Goal Status: Active Patient/caregiver verbalizes understanding of need to maintain therapeutic glucose control per primary care physician Date Initiated: 09/21/2016 Target Resolution Date: 03/25/2017 Goal Status: Active Interventions: Assess patient nutrition upon admission and as needed per policy Garde, [place]. ([id number]) Notes: ` Orientation to the Wound Care Program Nursing Diagnoses: Knowledge deficit related to the wound healing center program Goals: Patient/caregiver will verbalize understanding of the [place] Date Initiated: 09/21/2016 Target Resolution Date: 03/25/2017 Goal Status: Active Interventions: Provide education on orientation to the wound center Notes: ` Pain, Acute or Chronic Nursing Diagnoses: Pain, acute or chronic: actual or potential Potential alteration in comfort, pain Goals: Patient will verbalize adequate pain control and receive pain control interventions during procedures as needed Date Initiated: 09/21/2016 Target Resolution Date: 03/25/2017 Goal Status: Active Patient/caregiver will verbalize adequate pain control between visits Date Initiated: 09/21/2016 Target Resolution Date: 03/25/2017 Goal Status: Active Patient/caregiver will verbalize comfort level met Date Initiated: 09/21/2016 Target Resolution Date: 03/25/2017 Goal Status: Active Interventions: Assess comfort goal upon admission Complete pain assessment as per visit requirements Notes: ` Wound/Skin Impairment Nursing Diagnoses: YAMNA, MACKEL. ([id number]) Impaired tissue integrity Goals: Ulcer/skin breakdown will have a volume reduction of 30% by week 4 Date  Initiated: 09/21/2016 Target Resolution Date: 03/25/2017 Goal Status: Active Ulcer/skin breakdown will have a volume reduction of 50% by week 8 Date Initiated: 09/21/2016 Target Resolution Date: 03/25/2017 Goal Status: Active Ulcer/skin breakdown will have a volume reduction of 80% by week  12 Date Initiated: 09/21/2016 Target Resolution Date: 03/25/2017 Goal Status: Active Interventions: Assess ulceration(s) every visit Notes: Electronic Signature(s) Signed: 08/30/2017 5:43:38 PM By: Alric Quan Entered By: Alric Quan on 08/30/2017 08:18:34 [place], [place]. ([id number]) -------------------------------------------------------------------------------- Pain Assessment Details Patient Name: [name] Date of Service: 08/30/2017 8:00 AM Medical Record Patient Account Number: [id number] Number: Treating RN: Ahmed Prima [birth year] (56 y.o. Other Clinician: Date of Birth/Sex: Female) Treating ROBSON, MICHAEL Primary Care Chaney Maclaren: Lavera Guise Jan Olano/Extender: G Referring Saleemah Mollenhauer: Sallee Lange in Treatment: 78 Active Problems Location of Pain Severity and Description of Pain Patient Has Paino No Site Locations Pain Management and Medication Current Pain Management: Electronic Signature(s) Signed: 08/30/2017 5:43:38 PM By: Alric Quan Entered By: Alric Quan on 08/30/2017 08:10:49 Henricks, [name] ([id number]) -------------------------------------------------------------------------------- Patient/Caregiver Education Details Patient Name: [name] Date of Service: 08/30/2017 8:00 AM Medical Record Patient Account Number: [id number] Number: Treating RN: Ahmed Prima 26-Sep-1961 (56 y.o. Other Clinician: Date of Birth/Gender: Female) Treating ROBSON, MICHAEL Primary Care Physician: Lavera Guise Physician/Extender: G Referring Physician: Sallee Lange in Treatment: 76 Education Assessment Education Provided To: Patient Education Topics Provided Wound/Skin Impairment: Handouts: Other: change dressing as ordered Methods: Demonstration, Explain/Verbal Responses: State content correctly Electronic Signature(s) Signed: 08/30/2017 5:43:38 PM By: Alric Quan Entered By: Alric Quan on 08/30/2017  08:21:36 Crume, Benedetta Carlean [name] ([id number]) -------------------------------------------------------------------------------- Wound Assessment Details Patient Name: [name] Date of Service: 08/30/2017 8:00 AM Medical Record Patient Account Number: [id number] Number: Treating RN: Ahmed Prima [birth year] (56 y.o. Other Clinician: Date of Birth/Sex: Female) Treating ROBSON, MICHAEL Primary Care Coby Antrobus: Lavera Guise Nikaya Nasby/Extender: G Referring Halley Kincer: Sallee Lange in Treatment: 49 Wound Status Wound Number: 3 Primary Pressure Ulcer Etiology: Wound Location: Right Gluteal fold Wound Status: Open Wounding Event: Pressure Injury Comorbid Asthma, Hypertension, Type II Date Acquired: 04/21/2016 History: Diabetes, Neuropathy Weeks Of Treatment: 49 Clustered Wound: No Wound Measurements Length: (cm) 0.3 Width: (cm) 1 Depth: (cm) 2.3 Area: (cm) 0.236 Volume: (cm) 0.542 % Reduction in Area: 92.3% % Reduction in Volume: 96.8% Epithelialization: Small (1-33%) Tunneling: No Undermining: No Wound Description Classification: Category/Stage IV Wound Margin: Distinct, outline attached Exudate Amount: Large Exudate Type: Serous Exudate Color: amber Foul Odor After Cleansing: Yes Due to Product Use: No Slough/Fibrino Yes Wound Bed Granulation Amount: Medium (34-66%) Exposed Structure Granulation Quality: Red Fat Layer (Subcutaneous Tissue) Exposed: Yes Necrotic Amount: Medium (34-66%) Necrotic Quality: Adherent Slough Periwound Skin Texture Texture Color No Abnormalities Noted: No No Abnormalities Noted: No Callus: No Atrophie Blanche: No Crepitus: No Cyanosis: No Excoriation: No Ecchymosis: No Induration: No Erythema: No Rash: No Hemosiderin Staining: No Scarring: Yes Mottled: No Nettleton, Preslea L. ([id number]) Moisture Pallor: No No Abnormalities Noted: No Rubor: No Dry / Scaly: No Temperature / Pain Maceration: Yes Temperature: No  Abnormality Tenderness on Palpation: Yes Wound Preparation Ulcer Cleansing: Rinsed/Irrigated with Saline Topical Anesthetic Applied: Other: lidocaine 4%, Treatment Notes Wound #3 (Right Gluteal fold) 1. Cleansed with: Clean wound with Normal Saline 2. Anesthetic Topical Lidocaine 4% cream to wound bed prior to debridement 3. Peri-wound Care: Skin Prep 4. Dressing Applied: Other dressing (specify in notes) 5. Secondary Dressing Applied Bordered Foam Dressing Notes SilverCel Rope Electronic Signature(s) Signed: 08/30/2017 5:43:38 PM By: Alric Quan  Entered By: Alric Quan on 08/30/2017 08:18:13 Heinkel, Aalijah Carlean [name] ([id number]) -------------------------------------------------------------------------------- [place] Details Patient Name: [name] Date of Service: 08/30/2017 8:00 AM Medical Record Patient Account Number: [id number] Number: Treating RN: Ahmed Prima 05/23/1961 (56 y.o. Other Clinician: Date of Birth/Sex: Female) Treating ROBSON, MICHAEL Primary Care Edker Punt: Lavera Guise Elisabella Hacker/Extender: G Referring Evianna Chandran: Sallee Lange in Treatment: 49 Vital Signs Time Taken: 08:10 Temperature (F): 98.3 Height (in): 63 Pulse (bpm): 92 Weight (lbs): 257 Respiratory Rate (breaths/min): 18 Body Mass Index (BMI): 45.5 Blood Pressure (mmHg): 133/65 Reference Range: 80 - 120 mg / dl Electronic Signature(s) Signed: 08/30/2017 5:43:38 PM By: Alric Quan Entered By: Alric Quan on 08/30/2017 08:12:33

## 2017-09-01 NOTE — Progress Notes (Signed)
[name], [name] ([id number]) Visit Report for 08/30/2017 Debridement Details Patient Name: [name] Date of Service: 08/30/2017 8:00 AM Medical Record Patient Account Number: [id number] Number: Treating RN: Phillis Haggis 02-10-1961 (56 y.o. Other Clinician: Date of Birth/Sex: Female) Treating Braysen Cloward Primary Care Provider: Joen [name] Provider/Extender: G Referring Provider: Tedra Senegal in Treatment: 49 Debridement Performed for Wound #3 Right Gluteal fold Assessment: Performed By: Physician Maxwell Caul, MD Debridement: Debridement Pre-procedure Verification/Time Out Yes - 08:25 Taken: Start Time: 08:26 Pain Control: Lidocaine 4% Topical Solution Level: Skin/Subcutaneous Tissue Total Area Debrided (L x 0.3 (cm) x 1 (cm) = 0.3 (cm) W): Tissue and other Viable, Non-Viable, Exudate, Fibrin/Slough, Subcutaneous material debrided: Instrument: Other : scoop Bleeding: Minimum Hemostasis Achieved: Pressure End Time: 08:28 Procedural Pain: 0 Post Procedural Pain: 0 Response to Treatment: Procedure was tolerated well Post Debridement Measurements of Total Wound Length: (cm) 0.3 Stage: Category/Stage IV Width: (cm) 1 Depth: (cm) 2.3 Volume: (cm) 0.542 Character of Wound/Ulcer Post Requires Further Debridement: Debridement Post Procedure Diagnosis Same as Pre-procedure Electronic Signature(s) [name], [name] ([id number]) Signed: 08/30/2017 5:15:21 PM By: Baltazar Najjar MD Signed: 08/30/2017 5:43:38 PM By: Alejandro Mulling Entered By: Baltazar Najjar on 08/30/2017 08:31:29 [name], [name] ([id number]) -------------------------------------------------------------------------------- HPI Details Patient Name: [name] Date of Service: 08/30/2017 8:00 AM Medical Record Patient Account Number: [id number] Number: Treating RN: Phillis Haggis August 18, 1961 (56 y.o. Other Clinician: Date of Birth/Sex: Female) Treating Shannell Mikkelsen,  Elianie Hubers Primary Care Provider: Joen [name] Provider/Extender: G Referring Provider: Tedra Senegal in Treatment: 54 History of Present Illness Location: bilateral gluteal ulcerations and left popliteal ulceration with tunneling Quality: adits to intermittent aching to ischial ulcers, no discomfort to sinus tract Severity: right iscial ulcer with increased depth Duration: chronic ulcers to bilateral ischial ulcers and sinus tract Timing: The pain is intermittent in severity as far as how intense it becomes but is present all the time. Manipulationn makes this worse. Context: The wound occurred when the patient had a fall and was unconscious for about 48 hours laying on the floor and she had pressure injury at that stage. Modifying Factors: Other treatment(s) tried include:as noted below she has been seen by visiting wound care physicians or nurse practitioners and details have been noted Associated Signs and Symptoms: has yet to receive offloading cushions and mattress overlay HPI Description: 56 year old patient who was seen by visiting [name] wound care specialist for a wound on both her buttock and was found to have an unstageable wound on the right buttock for about 2 months. I understand that she had a fall and was laying on the floor for about 48 hours before she was found and taken to the ICU and had a long injury to her gluteal area from pressure and also had broken her right humerus. She has had a right proximal humerus fracture and has been followed up with orthopedics recently. The patient has a past medical history of type 2 diabetes mellitus, paraparesis, acute pyelonephritis, GERD, hypertension, glaucoma, chronic pain, anxiety neurosis, nicotine dependence, COPD. the patient had some debridement done and was to operative was recommended to use Silvadene dressing and offloading. She is a smoker and occasionally smokes a few cigarettes. the patient requested a second opinion  for months and is here to discuss her care. 09/21/16; the patient re-presents from home today for review of 3 different wounds. I note that she was seen in the clinic here in July at which time she had bilateral buttock  wounds. It was apparently suggested at that time that she use a wound VAC bridged to both wounds just near the initial tuberosity's bilaterally which she refused. The history was a bit difficult to put together. Apparently this patient became ill at the end of April of this year. She was found sitting on the floor she had apparently been for 2 days and subsequently admitted to hospital from 04/23/16 through 05/02/16 and at that point she was critically ill ultimately having sepsis secondary to UTI, nontraumatic rhabdomyolysis and diabetic ketoacidosis. She had acute renal failure and I think required ICU care including intubation. Patient states her wounds actually started at that point on the bilateral issue tuberosities however in reviewing the discharge summary from 5/8 I see no reference to wounds at that point. It did state that she had left lower extremity cellulitis however. Reviewing Epic I see no relevant x-rays. It would appear that her discharge creatinine was within the normal range and indeed on 9/15 her creatinine has remained normal. [name]. ([id number]) rehabilitation. There the wounds on her bilateral Buttocks were dressed. Only just before her discharge from the nursing facility she developed an "knot" which was interpreted as cellulitis on the posterior aspect of her left knee she was given antibiotics. Apparently sometime late in July a this actually opened and became a wound at home health care was tending to however she is still having purulent drainage coming from this and by my understanding the wound depth is actually become unmeasurable. I am not really clear about what home health has been placing  in any of these wound areas. The patient states that is something with silver and it. She is not been systemically unwell no fever or chills her appetite is good. She is a diabetic poorly controlled however she states that her recent blood sugars at home have been in the low to mid 100s. 09/28/16 On evaluation today patient appears to continue to exhibit the 3 areas of ulceration that were noted previous. She did have an x-ray of the right pelvis which showed evidence of potential soft tissue infection but no obvious osteomyelitis. There was a discussion last office visit concerning the possibility of a wound VAC. Witth that being said the x-ray report suggested that an MRI may be more appropriate to further evaluate the extent. Subsequently in regard to the wound over the popliteal portion of the left lower extremity with tunneling at 12:00 the CT scan that was ordered was denied by insurance as they state the patient has not had x-rays prior to advanced imaging. Patient states that she is frustrated with the situation overall. 10/05/16 in the interval since I last saw this patient last week she has had the x-ray of the knee performed. I did review that x-ray today and fortunately shows no evidence of osteomyelitis or other acute abnormality at this point in time. She continues to have the opening iin the posterior left popliteal space with tracking proximal up the posterior thigh. Nothing seems to have worsened but it also seems to have not improved. The same is true in regard to the right pressure ulcer over the gluteal region which extends toward the ischium. The left gluteal pressure ulcer actually appears to be doing somewhat better my opinion there is some necrotic slough but overall this appears fairly well. She tells me thatt she has some discomfort especially when home health is helping her with dressing changes as  they do not know her. At worse she rates her pain to be a 5 out of 10  right now it's more like a 1 out of 10. 10/07/16; still the patient has 3 different wound areas. She has a deep stage IV wound over her right ischial tuberosity. She is due to have an MRI next week. The wound over her left ischial tuberosity is more superficial and underwent debridement today. Finally she has a small open area in her left popliteal fossa the probes on measurably forward superiorly. Still a lot of drainage coming out of this. The CT scan that I ordered 3 weeks ago was questioned by her insurance company wanting a plain x-ray first. As I understand things result of this is nothing has been done in 3 weeks in terms of imaging the thigh and she has an MRI booked of this along with her pelvis for next week line 10/12/16; patient has a deep probing wound over the right ischiall tuberosity, stage III wound over the left visual tuberosity and a draining sinus in her left popliteal fossa. None of this much different from when I saw this 3 weeks ago. We have been using silver rope to the right ischial wound and a draining area in the left popliteal fossa. Plain silver alginate to the area on the left ischial tuberosity 10/19/16; the patient's wounds are essentially unchanged although the area on the left lower gluteal is actually improved. Our intake nurse noted drainage from the right initial tuberosity probing wound as well as the draining area in the left popliteal fossa. Both of these were cultured. She had x-rays I think at the insistence of her insurance company on 09/23/16 x-ray of the pelvis was not particularly helpful she did have soft tissue air over the right lower pelvis although with the depth of this wound this is not surprising. An x-ray of her left knee did not show any specific abnormalities. We are still using silver alginate to these wound areas. Her MRI is booked for 10/27 10/26/16; cultures of the purulent drainage in her right initial tuberosity wound grew moderate  Proteus and few staph aureus. The same organisms were cultured out of the left knee sinus tract posteriorly. The staph aureus is MRSA. I had started her on Augmentin last week I added doxycycline. The MRI of the left lower extremity and pelvis was finally done. The MRI of the femur showed subcutaneous soft tissue swelling Glatt, Jaisa L. ([id number]) edema fluid and myositis in the vastus lateralis muscle but no soft tissue abscess septic arthritis or osteomyelitis. MRI of the pelvis showed the left wound to be more expensive extending down to the bone there was osteomyelitis. Left hamstring tendons were also involved. No septic arthritis involving the hip. The decubitus ulcer on the right side showed no definite osteomyelitis or abscess.. The right hip wound is actually the one the probes 6 cm downward. But the MRI showing infection including osteomyelitis on the left explains the draining sinus in the popliteal fossa on the left. She did have antibiotics in the hospitalization last time and this extended into her nursing home stay but I'm not exactly sure what antibiotics and for what duration. According the patient this did include vancomycin with considerable effort of our staff we are able to get the patient into see Dr. Sampson Goon today. There were transportation difficulties. Her mother had open heart surgery and is in the ICU in [place] therefore her brother was unable to transport. Dr. Jarrett Ables office  graciously arranged time to see her today. From my point of view she is going to require IV vancomycin plus perhaps a third generation cephalosporin. I plan to keep her on doxycycline and Augmentin until the IV antibiotics can be arranged. 11/02/2016 - Malillany presents today for management of ulcers; She saw Dr. 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 She is to have the PICC line placed today  and receive the initial dose for both antibiotics today. She has yet to receive the offloading chair cushion and/or mattress overlay from home health, apparently this has been a 3 week process. I encouraged her to speak to home health regarding this matter, along with offering home health to contact the wouns care center with any questions or concerns. The left ischial pressure ulcer continues to imporve, while to right ischial ulcer has increased in depth. The popliteal fossa sinus tract remains unmeasurable due to the limitation of depth measurement (tract extends beyond our measuring devices). 11-16-2016 Ms. Stai presents today for evaluation and management of bilateral ischial stage IV pressure ulcers and sinus tract to the left popliteal fossa. she is under the care of Dr. Sampson Goon for IV antibiotic therapy; she states that the vancomycin was placed on hold and will be restarted at a lower dose based on her renal function. She continues taking Zosyn in addition to the vancomycin. She also states that she has yet to receive offloading cushions from home health, according to her she does not qualify for these offloading cushions because "the ulcers are unstageable ". We will contact the home health agency today to lend clarity regarding her pressure ulcers. The left popliteal fossa sinus tract continues to be a measurable as it extends beyond the length of our measuring devices.. 11/30/16; the patient is still on vancomycin and Zosyn. The depth of the draining sinus behind her left popliteal fossa is down to 4 cm although there is still serosanguineous drainage coming out of this. She saw Dr. Sampson Goon of infectious disease yesterday the idea is to weeks more of IV antibiotics and then oral antibiotics although I have not read his note. The area on the left gluteal fold is just about healed. She has a 6 cm draining sinus over the right initial tuberosity although I cannot feel bone at the  base of this. As far as the patient is aware she has not had a recheck of her inflammatory markers. 12/07/16; patient is on vancomycin and Zosyn appointment with Dr. Sampson Goon on the 19th at which point the patient expects to have a change in antibiotics. Remarkable improvement over the wound over the left ischial tuberosity which is just about closed. The draining sinus in her popliteal fossa has 0.4 cm in depth. The area on the right ischial tuberosity still probes down 7 cm. This is closed and overall wound dimensions but not depth. 12/14/16; patient is completing her vancomycin and Zosyn and per her she is going to transition to Bactrim and Augmentin for another 3 weeks. The area on her left gluteal fold is closed except for some skin tears. The area behind her left knee is no longer has any depth. The only remaining area that is of clinical concern is on the right gluteal fold probing towards the right ischial tuberosity. Today this measures 6.9 cm in depth. Very gritty surface 12/21/16; patient is now on Bactrim and Augmentin as directed by infectious disease. This should be for another  2 weeks. The area in her left gluteal fold and left popliteal are closed over and fully healed. [name], [name] ([id number]) Measurements today at 7 cm in the right buttock wound is unchanged from last week. 12/27/16; patient is on Bactrim and Augmentin for another week as directed by infectious disease. She has completed her IV antibiotics. She is not been systemically unwell no fever no chills. The area on her right buttock measured over 6 cm in depth. There is no palpable bone. No evidence of surrounding soft tissue infection. She is complaining of tongue irritation and has a history of thrush 01/04/17; patient is been back to see Dr. Sampson Goon, her Augmentin was stopped but he continued the Bactrim for another 3 weeks. Depth of the wound is 6.7 cm there is been no major change in either direction. She is  not receive the wound VAC from home health I think because of confusion about who is supposed to provided will actually talk to the home health agency today [[facility] The net no major change. 01/18/17; patient obtained her wound VAC about 10 days ago however for some reason it was not actually put on the wound. She is therefore here for [place] to apply this I guess. No other issues are noted. She is not complaining of pain fever drainage 02/01/17; patient is here now having the wound VAC or 3-4 weeks to a deep pressure area over the right initial tuberosity. This measures 6 cm in depth today which is about half a centimeter better than 2 weeks ago. There is no evidence she is systemically unwell no fever no chills no pain around the area. 02/15/17; I follow this patient every 2 weeks for a deep area over the right ischial tuberosity. This measures 5.5 cm today which is a continued improvement of 1.2 cm from 1/10 and down 0.5 cm from her visit 2 weeks ago 03/01/17; continued difficult area over the right initial tuberosity using the wound VAC. Depth today of 5.1 cm which is improved. Does not appear to be a lot of drainage in the canister. Antibiotics were finally stopped by Dr. Sampson Goon bactrim[]  and inflammatory markers have been repeated 03/15/17; fall this lady every 2 weeks for a difficult area over her lower right gluteal area/ischial tuberosity. Depth today at 4.6 cm. This is a slow but steady improvement in the depth of this wound. Although we have labeled this as a pressure ulcer there may have been an underlying infection here at one point before we saw her. She had osteomyelitis on the left extensively which is since resolved 03/29/17- patient is here for follow-up evaluation of her right ischial pressure ulcer. She continues with the wound VAC and home health. According to the nurse home health has been using less foams and appropriate and we will instruct accordingly. The patient continues to  smoke, approximately 10 cigarettes a day. She has been advised to decrease that in half by her next appointment, with a goal of complete cessation. She states her blood sugars have been consistently less than 150. She states that she spends most of her day position left lateral or prone. She does have an air mattress on her bed, she does not have an mattress for her chair, she states she cannot afford this. 04/12/17; patient is here for evaluation of her right ischial pressure ulcer. We continue to use a wound VAC with minimal improvement today the depth of this measuring 4.4 cm versus 4.6 cm 2 weeks ago. We are using  a [employer] wound VAC on this area. There is not excessive drainage no pain. The patient tells me she tries to keep off this in bed but is up in the wheelchair for 2 hours a day. She is limited in no her overall ambulation but is improving and apparently is getting bilateral lower extremity braces which she hopes will improve her ability to walk independently. 05/10/17; Depth at 3.3cm. Improved 05/24/17; depth at 3.5 cm. This is not improved since last time. Not clear if they are using collagen under the foam 06/07/17; depth that 2.9 which is a slight improvement. Still using the wound VAC. 06/21/17; depth is 2.9 cm which is exactly the same as last time. Also the appearance of this wound is completely the same. We have been using silver collagen under a wound VAC. 07/05/17 patient presents today for reevaluation concerning her right Ischial wound. She had switched insurance companies and so it does appear that the Wound VAC needs to be reauthorized which we are working on this morning. Nonetheless her wound has been saying about the same we have continued to use the silver collagen underneath the wound VAC. She has no discomfort. 07/19/17; patient follows every 2 weeks for her right if she'll wound. Since I last saw this a month ago her dimensions of come down to 2.6 cm. This is slightly down  from a month ago when it was 2.9 cm in 4 Probert, Dillyn L. ([id number]) months ago it was 4.6 cm. I note that there were insurance company issues with regard to the wound VAC which she apparently is not having on for several weeks now. I think it would be reasonable to change therapies here. Will use silver alginate rope 08/02/17 on evaluation today patient's wound actually appears to be doing significantly better in regard to the depth as compared to her previous evaluation. She is having no discomfort. Unfortunately she never got the Wound VAC reapproved following the insurance issues from several weeks ago. With that being said it does not appear that she needs to requires the Wound VAC anymore and in fact I feel that she is doing better and making greater improvements at this point without it. Fortunately she has no nausea, vomiting, diarrhea, fevers, or chills. 08/16/17; patient's wound depth down to 2.3 cm. She is using silver alginate packing rope. Note that her wound VAC was discontinued due to insurance issues. This is not particularly surprising. She has not been systemically unwell and has no other new complaints 08/30/17; no change in depth. Still at 2.3 cm. Still with the same gritty surface requiring debridement. I've been using silver alginate for quite a period of time although this came down nicely in the last month Electronic Signature(s) Signed: 08/30/2017 5:15:21 PM By: Baltazar Najjar MD Entered By: Baltazar Najjar on 08/30/2017 08:32:33 [name], [name] ([id number]) -------------------------------------------------------------------------------- Physical Exam Details Patient Name: [name] Date of Service: 08/30/2017 8:00 AM Medical Record Patient Account Number: [id number] Number: Treating RN: Phillis Haggis 28-Nov-1961 (56 y.o. Other Clinician: Date of Birth/Sex: Female) Treating Islah Eve Primary Care Provider: Joen [name] Provider/Extender: G Referring  Provider: Tedra Senegal in Treatment: 19 Constitutional Sitting or standing Blood Pressure is within target range for patient.. Pulse regular and within target range for patient.Marland Kitchen Respirations regular, non-labored and within target range.. Temperature is normal and within the target range for the patient.Marland Kitchen appears in no distress. Notes Wound exam; as per usual debridement with an open curet. Necrotic surface material removed. I've  been hoping that this stimulates granulation. No evidence of surrounding soft tissue erythema or crepitus Electronic Signature(s) Signed: 08/30/2017 5:15:21 PM By: Baltazar Najjar MD Entered By: Baltazar Najjar on 08/30/2017 08:33:19 [name], [name] -------------------------------------------------------------------------------- Physician Orders Details Patient Name: [name] Date of Service: 08/30/2017 8:00 AM Medical Record Patient Account Number: [id number] Number: Treating RN: Phillis Haggis 05/27/1961 (56 y.o. Other Clinician: Date of Birth/Sex: Female) Treating Keirston Saephanh Primary Care Provider: Joen [name] Provider/Extender: G Referring Provider: Tedra Senegal in Treatment: 34 Verbal / Phone Orders: No Diagnosis Coding Wound Cleansing Wound #3 Right Gluteal fold o Clean wound with Normal Saline. o Cleanse wound with mild soap and water Skin Barriers/Peri-Wound Care Wound #3 Right Gluteal fold o Skin Prep Primary Wound Dressing Wound #3 Right Gluteal fold o Other: - SilverCel non-adherent rope lightly pack in wound Secondary Dressing Wound #3 Right Gluteal fold o Dry Gauze o Boardered Foam Dressing Dressing Change Frequency Wound #3 Right Gluteal fold o Three times weekly Follow-up Appointments Wound #3 Right Gluteal fold o Return Appointment in 2 weeks. Off-Loading Wound #3 Right Gluteal fold o Turn and reposition every 2 hours Additional Orders / Instructions Wound #3 Right  Gluteal fold Condon, Devera L. ([id number]) o Increase protein intake. Home Health Wound #3 Right Gluteal fold o Continue Home Health Visits - [facility] Nurse may visit PRN to address patientos wound care needs. o FACE TO FACE ENCOUNTER: MEDICARE and MEDICAID PATIENTS: I certify that this patient is under my care and that I had a face-to-face encounter that meets the physician face-to-face encounter requirements with this patient on this date. The encounter with the patient was in whole or in part for the following MEDICAL CONDITION: (primary reason for Home Healthcare) MEDICAL NECESSITY: I certify, that based on my findings, NURSING services are a medically necessary home health service. HOME BOUND STATUS: I certify that my clinical findings support that this patient is homebound (i.e., Due to illness or injury, pt requires aid of supportive devices such as crutches, cane, wheelchairs, walkers, the use of special transportation or the assistance of another person to leave their place of residence. There is a normal inability to leave the home and doing so requires considerable and taxing effort. Other absences are for medical reasons / religious services and are infrequent or of short duration when for other reasons). o If current dressing causes regression in wound condition, may D/C ordered dressing product/s and apply Normal Saline Moist Dressing daily until next [facility] / Other MD appointment. Notify W[facility] Healing Center of regression in wound condition at [phone]. o Please direct any NON-WOUND related issues/requests for orders to patient's Primary Care Physician Electronic Signature(s) Signed: 08/30/2017 5:15:21 PM By: Baltazar Najjar MD Signed: 08/30/2017 5:43:38 PM By: Alejandro Mulling Entered By: Alejandro Mulling on 08/30/2017 08:28:25 [name], [name]  ([id number]) -------------------------------------------------------------------------------- Problem List Details Patient Name: [name] Date of Service: 08/30/2017 8:00 AM Medical Record Patient Account Number: [id number] Number: Treating RN: Phillis Haggis April 03, 1961 (56 y.o. 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 09/21/2016 09/21/2016 severity Resolved Problems ICD-10 Code Description Active Date Resolved Date L89.323 Pressure ulcer of left buttock, stage 3 09/21/2016 09/21/2016 L89.324 Pressure ulcer of left buttock, stage 4 11/16/2016 11/16/2016 Electronic Signature(s) Signed: 08/30/2017 5:15:21 PM By: Baltazar Najjar MD [name], [name] ([id number]) Entered By: Baltazar Najjar on 08/30/2017 08:31:04 [name], [name] ([id number]) -------------------------------------------------------------------------------- Progress Note Details Patient Name: [name] Date of Service: 08/30/2017 8:00 AM Medical Record Patient Account Number: [id number] Number: Treating RN: Phillis Haggis 1961/07/06 (56 y.o. Other Clinician: Date of Birth/Sex: Female) Treating Antanasia Kaczynski Primary Care Provider: Joen [name] Provider/Extender: G Referring Provider: Tedra Sen[place] in Treatment: 49 Subjective History of Present Illness (HPI) The following HPI elements were documented for the patient's wound: Location: bilateral  gluteal ulcerations and left popliteal ulceration with tunneling Quality: adits to intermittent aching to ischial ulcers, no discomfort to sinus tract Severity: right iscial ulcer with increased depth Duration: chronic ulcers to bilateral ischial ulcers and sinus tract Timing: The pain is intermittent in severity as far as how intense it becomes but is present all the time. Manipulationn makes this worse. Context: The wound occurred when the patient had a fall and was unconscious for about 48 hours laying on the floor and she had pressure injury at that stage. Modifying Factors: Other treatment(s) tried include:as noted below she has been seen by visiting wound care physicians or nurse practitioners and details have been noted Associated Signs and Symptoms: has yet to receive offloading cushions and mattress overlay 56 year old patient who was seen by visiting [name] wound care specialist for a wound on both her buttock and was found to have an unstageable wound on the right buttock for about 2 months. I understand that she had a fall and was laying on the floor for about 48 hours before she was found and taken to the ICU and had a long injury to her gluteal area from pressure and also had broken her right humerus. She has had a right proximal humerus fracture and has been followed up with orthopedics recently. The patient has a past medical history of type 2 diabetes mellitus, paraparesis, acute pyelonephritis, GERD, hypertension, glaucoma, chronic pain, anxiety neurosis, nicotine dependence, COPD. the patient had some debridement done and was to operative was recommended to use Silvadene dressing and offloading. She is a smoker and occasionally smokes a few cigarettes. the patient requested a second opinion for months and is here to discuss her care. 09/21/16; the patient re-presents from home today for review of 3 different wounds. I note that she was seen in the clinic here in July at which  time she had bilateral buttock wounds. It was apparently suggested at that time that she use a wound VAC bridged to both wounds just near the initial tuberosity's bilaterally which she refused. The history was a bit difficult to put together. Apparently this patient became ill at the end of April of this year. She was found sitting on the floor she had apparently been for 2 days and subsequently admitted to hospital from 04/23/16 through 05/02/16 and at that point she was critically ill ultimately having sepsis secondary to UTI, nontraumatic rhabdomyolysis and diabetic ketoacidosis. She had acute renal failure and I think required ICU care including intubation. Patient states her wounds actually started at that point on the bilateral issue tuberosities however in reviewing the discharge summary from 5/8 I see no reference to [name], [name] ([id number]) wounds at that point. It did state that  she had left lower extremity cellulitis however. Reviewing Epic I see no relevant x-rays. It would appear that her discharge creatinine was within the normal range and indeed on 9/15 her creatinine has remained normal. [facility] There the wounds on her bilateral Buttocks were dressed. Only just before her discharge from the nursing facility she developed an "knot" which was interpreted as cellulitis on the posterior aspect of her left knee she was given antibiotics. Apparently sometime late in July a this actually opened and became a wound at home health care was tending to however she is still having purulent drainage coming from this and by my understanding the wound depth is actually become unmeasurable. I am not really clear about what home health has been placing in any of these wound areas. The patient states that is something with silver and it. She is not been systemically unwell no fever or chills her appetite is good. She is a diabetic poorly  controlled however she states that her recent blood sugars at home have been in the low to mid 100s. 09/28/16 On evaluation today patient appears to continue to exhibit the 3 areas of ulceration that were noted previous. She did have an x-ray of the right pelvis which showed evidence of potential soft tissue infection but no obvious osteomyelitis. There was a discussion last office visit concerning the possibility of a wound VAC. Witth that being said the x-ray report suggested that an MRI may be more appropriate to further evaluate the extent. Subsequently in regard to the wound over the popliteal portion of the left lower extremity with tunneling at 12:00 the CT scan that was ordered was denied by insurance as they state the patient has not had x-rays prior to advanced imaging. Patient states that she is frustrated with the situation overall. 10/05/16 in the interval since I last saw this patient last week she has had the x-ray of the knee performed. I did review that x-ray today and fortunately shows no evidence of osteomyelitis or other acute abnormality at this point in time. She continues to have the opening iin the posterior left popliteal space with tracking proximal up the posterior thigh. Nothing seems to have worsened but it also seems to have not improved. The same is true in regard to the right pressure ulcer over the gluteal region which extends toward the ischium. The left gluteal pressure ulcer actually appears to be doing somewhat better my opinion there is some necrotic slough but overall this appears fairly well. She tells me thatt she has some discomfort especially when home health is helping her with dressing changes as they do not know her. At worse she rates her pain to be a 5 out of 10 right now it's more like a 1 out of 10. 10/07/16; still the patient has 3 different wound areas. She has a deep stage IV wound over her right ischial tuberosity. She is due to have an MRI  next week. The wound over her left ischial tuberosity is more superficial and underwent debridement today. Finally she has a small open area in her left popliteal fossa the probes on measurably forward superiorly. Still a lot of drainage coming out of this. The CT scan that I ordered 3 weeks ago was questioned by her insurance company wanting a plain x-ray first. As I understand things result of this is nothing has been done in 3 weeks in terms of imaging the thigh and she  has an MRI booked of this along with her pelvis for next week line 10/12/16; patient has a deep probing wound over the right ischiall tuberosity, stage III wound over the left visual tuberosity and a draining sinus in her left popliteal fossa. None of this much different from when I saw this 3 weeks ago. We have been using silver rope to the right ischial wound and a draining area in the left popliteal fossa. Plain silver alginate to the area on the left ischial tuberosity 10/19/16; the patient's wounds are essentially unchanged although the area on the left lower gluteal is actually improved. Our intake nurse noted drainage from the right initial tuberosity probing wound as well as the draining area in the left popliteal fossa. Both of these were cultured. She had x-rays I think at the insistence of her insurance company on 09/23/16 x-ray of the pelvis was not particularly helpful she did have soft tissue air over the right lower pelvis although with the depth of this wound this is not surprising. An x-ray of her left knee did not show any specific abnormalities. We are still using silver alginate to these wound areas. Her MRI is booked for 10/27 10/26/16; cultures of the purulent drainage in her right initial tuberosity wound grew moderate Proteus and [name], [name] ([id number]) few staph aureus. The same organisms were cultured out of the left knee sinus tract posteriorly. The staph aureus is MRSA. I had started her on  Augmentin last week I added doxycycline. The MRI of the left lower extremity and pelvis was finally done. The MRI of the femur showed subcutaneous soft tissue swelling edema fluid and myositis in the vastus lateralis muscle but no soft tissue abscess septic arthritis or osteomyelitis. MRI of the pelvis showed the left wound to be more expensive extending down to the bone there was osteomyelitis. Left hamstring tendons were also involved. No septic arthritis involving the hip. The decubitus ulcer on the right side showed no definite osteomyelitis or abscess.. The right hip wound is actually the one the probes 6 cm downward. But the MRI showing infection including osteomyelitis on the left explains the draining sinus in the popliteal fossa on the left. She did have antibiotics in the hospitalization last time and this extended into her nursing home stay but I'm not exactly sure what antibiotics and for what duration. According the patient this did include vancomycin with considerable effort of our staff we are able to get the patient into see Dr. Sampson Goon today. There were transportation difficulties. Her mother had open heart surgery and is in the ICU in [place] therefore her brother was unable to transport. Dr. Jarrett Ables office graciously arranged time to see her today. From my point of view she is going to require IV vancomycin plus perhaps a third generation cephalosporin. I plan to keep her on doxycycline and Augmentin until the IV antibiotics can be arranged. 11/02/2016 - Tonianne presents today for management of ulcers; She saw Dr. 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 She is to have the PICC line placed today and receive the initial dose for both antibiotics today. She has yet to receive the offloading chair cushion and/or mattress overlay from home health, apparently this has been a 3  week process. I encouraged her to speak to home health regarding this matter, along with offering home health to contact the wouns care center with any questions or concerns. The left  ischial pressure ulcer continues to imporve, while to right ischial ulcer has increased in depth. The popliteal fossa sinus tract remains unmeasurable due to the limitation of depth measurement (tract extends beyond our measuring devices). 11-16-2016 Ms. Hodgkins presents today for evaluation and management of bilateral ischial stage IV pressure ulcers and sinus tract to the left popliteal fossa. she is under the care of Dr. Sampson Goon for IV antibiotic therapy; she states that the vancomycin was placed on hold and will be restarted at a lower dose based on her renal function. She continues taking Zosyn in addition to the vancomycin. She also states that she has yet to receive offloading cushions from home health, according to her she does not qualify for these offloading cushions because "the ulcers are unstageable ". We will contact the home health agency today to lend clarity regarding her pressure ulcers. The left popliteal fossa sinus tract continues to be a measurable as it extends beyond the length of our measuring devices.. 11/30/16; the patient is still on vancomycin and Zosyn. The depth of the draining sinus behind her left popliteal fossa is down to 4 cm although there is still serosanguineous drainage coming out of this. She saw Dr. Sampson Goon of infectious disease yesterday the idea is to weeks more of IV antibiotics and then oral antibiotics although I have not read his note. The area on the left gluteal fold is just about healed. She has a 6 cm draining sinus over the right initial tuberosity although I cannot feel bone at the base of this. As far as the patient is aware she has not had a recheck of her inflammatory markers. 12/07/16; patient is on vancomycin and Zosyn appointment with Dr. Sampson Goon on the  19th at which point the patient expects to have a change in antibiotics. Remarkable improvement over the wound over the left ischial tuberosity which is just about closed. The draining sinus in her popliteal fossa has 0.4 cm in depth. The area on the right ischial tuberosity still probes down 7 cm. This is closed and overall wound dimensions but not depth. 12/14/16; patient is completing her vancomycin and Zosyn and per her she is going to transition to Bactrim and Augmentin for another 3 weeks. The area on her left gluteal fold is closed except for some skin tears. The area behind her left knee is no longer has any depth. The only remaining area that is of clinical concern is on the right gluteal fold probing towards the right ischial tuberosity. Today this measures 6.9 cm in depth. Bellizzi, Lissa L. ([id number]) Very gritty surface 12/21/16; patient is now on Bactrim and Augmentin as directed by infectious disease. This should be for another 2 weeks. The area in her left gluteal fold and left popliteal are closed over and fully healed. Measurements today at 7 cm in the right buttock wound is unchanged from last week. 12/27/16; patient is on Bactrim and Augmentin for another week as directed by infectious disease. She has completed her IV antibiotics. She is not been systemically unwell no fever no chills. The area on her right buttock measured over 6 cm in depth. There is no palpable bone. No evidence of surrounding soft tissue infection. She is complaining of tongue irritation and has a history of thrush 01/04/17; patient is been back to see Dr. Sampson Goon, her Augmentin was stopped but he continued the Bactrim for another 3 weeks. Depth of the wound is 6.7 cm there is been no major change in either direction. She  is not receive the wound VAC from home health I think because of confusion about who is supposed to provided will actually talk to the home health agency today [[facility] The net no major  change. 01/18/17; patient obtained her wound VAC about 10 days ago however for some reason it was not actually put on the wound. She is therefore here for [place] to apply this I guess. No other issues are noted. She is not complaining of pain fever drainage 02/01/17; patient is here now having the wound VAC or 3-4 weeks to a deep pressure area over the right initial tuberosity. This measures 6 cm in depth today which is about half a centimeter better than 2 weeks ago. There is no evidence she is systemically unwell no fever no chills no pain around the area. 02/15/17; I follow this patient every 2 weeks for a deep area over the right ischial tuberosity. This measures 5.5 cm today which is a continued improvement of 1.2 cm from 1/10 and down 0.5 cm from her visit 2 weeks ago 03/01/17; continued difficult area over the right initial tuberosity using the wound VAC. Depth today of 5.1 cm which is improved. Does not appear to be a lot of drainage in the canister. Antibiotics were finally stopped by Dr. Sampson Goon bactrim[]  and inflammatory markers have been repeated 03/15/17; fall this lady every 2 weeks for a difficult area over her lower right gluteal area/ischial tuberosity. Depth today at 4.6 cm. This is a slow but steady improvement in the depth of this wound. Although we have labeled this as a pressure ulcer there may have been an underlying infection here at one point before we saw her. She had osteomyelitis on the left extensively which is since resolved 03/29/17- patient is here for follow-up evaluation of her right ischial pressure ulcer. She continues with the wound VAC and home health. According to the nurse home health has been using less foams and appropriate and we will instruct accordingly. The patient continues to smoke, approximately 10 cigarettes a day. She has been advised to decrease that in half by her next appointment, with a goal of complete cessation. She states her blood sugars have been  consistently less than 150. She states that she spends most of her day position left lateral or prone. She does have an air mattress on her bed, she does not have an mattress for her chair, she states she cannot afford this. 04/12/17; patient is here for evaluation of her right ischial pressure ulcer. We continue to use a wound VAC with minimal improvement today the depth of this measuring 4.4 cm versus 4.6 cm 2 weeks ago. We are using a [employer] wound VAC on this area. There is not excessive drainage no pain. The patient tells me she tries to keep off this in bed but is up in the wheelchair for 2 hours a day. She is limited in no her overall ambulation but is improving and apparently is getting bilateral lower extremity braces which she hopes will improve her ability to walk independently. 05/10/17; Depth at 3.3cm. Improved 05/24/17; depth at 3.5 cm. This is not improved since last time. Not clear if they are using collagen under the foam 06/07/17; depth that 2.9 which is a slight improvement. Still using the wound VAC. 06/21/17; depth is 2.9 cm which is exactly the same as last time. Also the appearance of this wound is completely the same. We have been using silver collagen under a wound VAC. 07/05/17 patient presents  today for reevaluation concerning her right Ischial wound. She had switched insurance companies and so it does appear that the Wound VAC needs to be reauthorized which we are working on this morning. Nonetheless her wound has been saying about the same we have continued to [name], [name] ([id number]) use the silver collagen underneath the wound VAC. She has no discomfort. 07/19/17; patient follows every 2 weeks for her right if she'll wound. Since I last saw this a month ago her dimensions of come down to 2.6 cm. This is slightly down from a month ago when it was 2.9 cm in 4 months ago it was 4.6 cm. I note that there were insurance company issues with regard to the wound VAC which she  apparently is not having on for several weeks now. I think it would be reasonable to change therapies here. Will use silver alginate rope 08/02/17 on evaluation today patient's wound actually appears to be doing significantly better in regard to the depth as compared to her previous evaluation. She is having no discomfort. Unfortunately she never got the Wound VAC reapproved following the insurance issues from several weeks ago. With that being said it does not appear that she needs to requires the Wound VAC anymore and in fact I feel that she is doing better and making greater improvements at this point without it. Fortunately she has no nausea, vomiting, diarrhea, fevers, or chills. 08/16/17; patient's wound depth down to 2.3 cm. She is using silver alginate packing rope. Note that her wound VAC was discontinued due to insurance issues. This is not particularly surprising. She has not been systemically unwell and has no other new complaints 08/30/17; no change in depth. Still at 2.3 cm. Still with the same gritty surface requiring debridement. I've been using silver alginate for quite a period of time although this came down nicely in the last month Objective Constitutional Sitting or standing Blood Pressure is within target range for patient.. Pulse regular and within target range for patient.Marland Kitchen Respirations regular, non-labored and within target range.. Temperature is normal and within the target range for the patient.Marland Kitchen appears in no distress. Vitals Time Taken: 8:10 AM, Height: 63 in, Weight: 257 lbs, BMI: 45.5, Temperature: 98.3 F, Pulse: 92 bpm, Respiratory Rate: 18 breaths/min, Blood Pressure: 133/65 mmHg. General Notes: Wound exam; as per usual debridement with an open curet. Necrotic surface material removed. I've been hoping that this stimulates granulation. No evidence of surrounding soft tissue erythema or crepitus Integumentary (Hair, Skin) Wound #3 status is Open. Original cause of  wound was Pressure Injury. The wound is located on the Right Gluteal fold. The wound measures 0.3cm length x 1cm width x 2.3cm depth; 0.236cm^2 area and 0.542cm^3 volume. There is Fat Layer (Subcutaneous Tissue) Exposed exposed. There is no tunneling or undermining noted. There is a large amount of serous drainage noted. Foul odor after cleansing was noted. The wound margin is distinct with the outline attached to the wound base. There is medium (34-66%) red granulation within the wound bed. There is a medium (34-66%) amount of necrotic tissue within the wound bed including Adherent Slough. The periwound skin appearance exhibited: Scarring, Maceration. The Wanless, Lasharon L. ([id number]) periwound skin appearance did not exhibit: Callus, Crepitus, Excoriation, Induration, Rash, Dry/Scaly, Atrophie Blanche, Cyanosis, Ecchymosis, Hemosiderin Staining, Mottled, Pallor, Rubor, Erythema. Periwound temperature was noted as No Abnormality. The periwound has tenderness on palpation. Assessment Active Problems ICD-10 L89.314 - Pressure ulcer of right buttock, stage 4 E11.42 - Type 2 diabetes  mellitus with diabetic polyneuropathy Procedures Wound #3 Pre-procedure diagnosis of Wound #3 is a Pressure Ulcer located on the Right Gluteal fold . There was a Skin/Subcutaneous Tissue Debridement ([id number]) debridement with total area of 0.3 sq cm performed by Maxwell Caul, MD. with the following instrument(s): scoop to remove Viable and Non-Viable tissue/material including Exudate, Fibrin/Slough, and Subcutaneous after achieving pain control using Lidocaine 4% Topical Solution. A time out was conducted at 08:25, prior to the start of the procedure. A Minimum amount of bleeding was controlled with Pressure. The procedure was tolerated well with a pain level of 0 throughout and a pain level of 0 following the procedure. Post Debridement Measurements: 0.3cm length x 1cm width x 2.3cm depth; 0.542cm^3 volume.  Post debridement Stage noted as Category/Stage IV. Character of Wound/Ulcer Post Debridement requires further debridement. Post procedure Diagnosis Wound #3: Same as Pre-Procedure Plan Wound Cleansing: Wound #3 Right Gluteal fold: Clean wound with Normal Saline. Cleanse wound with mild soap and water Skin Barriers/Peri-Wound Care: Wound #3 Right Gluteal fold: Skin Prep Bhatt, Jorene L. ([id number]) Primary Wound Dressing: Wound #3 Right Gluteal fold: Other: - SilverCel non-adherent rope lightly pack in wound Secondary Dressing: Wound #3 Right Gluteal fold: Dry Gauze Boardered Foam Dressing Dressing Change Frequency: Wound #3 Right Gluteal fold: Three times weekly Follow-up Appointments: Wound #3 Right Gluteal fold: Return Appointment in 2 weeks. Off-Loading: Wound #3 Right Gluteal fold: Turn and reposition every 2 hours Additional Orders / Instructions: Wound #3 Right Gluteal fold: Increase protein intake. Home Health: Wound #3 Right Gluteal fold: Continue Home Health Visits - [facility] Nurse may visit PRN to address patient s wound care needs. FACE TO FACE ENCOUNTER: MEDICARE and MEDICAID PATIENTS: I certify that this patient is under my care and that I had a face-to-face encounter that meets the physician face-to-face encounter requirements with this patient on this date. The encounter with the patient was in whole or in part for the following MEDICAL CONDITION: (primary reason for Home Healthcare) MEDICAL NECESSITY: I certify, that based on my findings, NURSING services are a medically necessary home health service. HOME BOUND STATUS: I certify that my clinical findings support that this patient is homebound (i.e., Due to illness or injury, pt requires aid of supportive devices such as crutches, cane, wheelchairs, walkers, the use of special transportation or the assistance of another person to leave their place of residence. There is a normal inability  to leave the home and doing so requires considerable and taxing effort. Other absences are for medical reasons / religious services and are infrequent or of short duration when for other reasons). If current dressing causes regression in wound condition, may D/C ordered dressing product/s and apply Normal Saline Moist Dressing daily until next Wound Healing Center / Other MD appointment. Notify Wound Healing Center of regression in wound condition at [phone]. Please direct any NON-WOUND related issues/requests for orders to patient's Primary Care Physician no change to silver alginate continue with silver alginate packing, change to hyrofera rope if not better next visit. Electronic Signature(s) TAYELOR, OSBORNE ([id number]) Signed: 08/30/2017 5:15:21 PM By: Baltazar Najjar MD Entered By: Baltazar Najjar on 08/30/2017 08:34:13 Nicolls, [name] ([id number]) -------------------------------------------------------------------------------- SuperBill Details Patient Name: [name] Date of Service: 08/30/2017 Medical Record Patient Account Number: [id number] Number: Treating RN: Phillis Haggis Jan 28, 1961 (56 y.o. 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

## 2017-09-05 ENCOUNTER — Ambulatory Visit: Payer: BLUE CROSS/BLUE SHIELD | Admitting: Pain Medicine

## 2017-09-13 ENCOUNTER — Ambulatory Visit: Payer: BLUE CROSS/BLUE SHIELD | Admitting: Internal Medicine

## 2017-09-20 ENCOUNTER — Ambulatory Visit: Payer: BLUE CROSS/BLUE SHIELD | Admitting: Internal Medicine

## 2017-10-04 ENCOUNTER — Encounter: Payer: BLUE CROSS/BLUE SHIELD | Attending: Internal Medicine | Admitting: Internal Medicine

## 2017-10-04 DIAGNOSIS — F17210 Nicotine dependence, cigarettes, uncomplicated: Secondary | ICD-10-CM | POA: Diagnosis not present

## 2017-10-04 DIAGNOSIS — J449 Chronic obstructive pulmonary disease, unspecified: Secondary | ICD-10-CM | POA: Insufficient documentation

## 2017-10-04 DIAGNOSIS — I10 Essential (primary) hypertension: Secondary | ICD-10-CM | POA: Insufficient documentation

## 2017-10-04 DIAGNOSIS — L89314 Pressure ulcer of right buttock, stage 4: Secondary | ICD-10-CM | POA: Diagnosis present

## 2017-10-04 DIAGNOSIS — F411 Generalized anxiety disorder: Secondary | ICD-10-CM | POA: Diagnosis not present

## 2017-10-04 DIAGNOSIS — E1142 Type 2 diabetes mellitus with diabetic polyneuropathy: Secondary | ICD-10-CM | POA: Diagnosis not present

## 2017-10-04 DIAGNOSIS — K219 Gastro-esophageal reflux disease without esophagitis: Secondary | ICD-10-CM | POA: Diagnosis not present

## 2017-10-05 NOTE — Progress Notes (Signed)
ANDRETTA, ERGLE ([id number]) Visit Report for 10/04/2017 HPI Details Patient Name: [name], [name] Date of Service: 10/04/2017 8:45 AM Medical Record Patient Account Number: [id number] Number: Treating RN: Phillis Haggis 11/05/1961 (56 y.o. Other Clinician: Date of Birth/Sex: Female) Treating [name] [name] Primary Care Provider: Joen [name] Provider/Extender: G Referring Provider: Tedra Senegal in Treatment: 12 History of Present Illness Location: bilateral gluteal ulcerations and left popliteal ulceration with tunneling Quality: adits to intermittent aching to ischial ulcers, no discomfort to sinus tract Severity: right iscial ulcer with increased depth Duration: chronic ulcers to bilateral ischial ulcers and sinus tract Timing: The pain is intermittent in severity as far as how intense it becomes but is present all the time. Manipulationn makes this worse. Context: The wound occurred when the patient had a fall and was unconscious for about 48 hours laying on the floor and she had pressure injury at that stage. Modifying Factors: Other treatment(s) tried include:as noted below she has been seen by visiting wound care physicians or nurse practitioners and details have been noted Associated Signs and Symptoms: has yet to receive offloading cushions and mattress overlay HPI Description: 56 year old patient who was seen by visiting [name] wound care specialist for a wound on both her buttock and was found to have an unstageable wound on the right buttock for about 2 months. I understand that she had a fall and was laying on the floor for about 48 hours before she was found and taken to the ICU and had a long injury to her gluteal area from pressure and also had broken her right humerus. She has had a right proximal humerus fracture and has been followed up with orthopedics recently. The patient has a past medical history of type 2 diabetes mellitus, paraparesis,  acute pyelonephritis, GERD, hypertension, glaucoma, chronic pain, anxiety neurosis, nicotine dependence, COPD. the patient had some debridement done and was to operative was recommended to use Silvadene dressing and offloading. She is a smoker and occasionally smokes a few cigarettes. the patient requested a second opinion for months and is here to discuss her care. 09/21/16; the patient re-presents from home today for review of 3 different wounds. I note that she was seen in the clinic here in July at which time she had bilateral buttock wounds. It was apparently suggested at that time that she use a wound VAC bridged to both wounds just near the initial tuberosity's bilaterally which she refused. The history was a bit difficult to put together. Apparently this patient became ill at the end of April of this year. She was found sitting on the floor she had apparently been for 2 days and subsequently admitted to hospital from 04/23/16 through 05/02/16 and at that point she was critically ill ultimately having sepsis secondary to UTI, nontraumatic rhabdomyolysis and diabetic ketoacidosis. She had acute renal failure and I think required ICU care including intubation. Patient states her wounds actually started at that point on the bilateral issue tuberosities however in reviewing the discharge summary from 5/8 I see no reference to Brigante, Jacquetta L. ([id number]) wounds at that point. It did state that she had left lower extremity cellulitis however. Reviewing Epic I see no relevant x-rays. It would appear that her discharge creatinine was within the normal range and indeed on 9/15 her creatinine has remained normal. [facility] There the wounds on her bilateral Buttocks were dressed. Only just before her discharge from the nursing facility she developed  an "knot" which was interpreted as cellulitis on the posterior aspect of her left knee she was  given antibiotics. Apparently sometime late in July a this actually opened and became a wound at home health care was tending to however she is still having purulent drainage coming from this and by my understanding the wound depth is actually become unmeasurable. I am not really clear about what home health has been placing in any of these wound areas. The patient states that is something with silver and it. She is not been systemically unwell no fever or chills her appetite is good. She is a diabetic poorly controlled however she states that her recent blood sugars at home have been in the low to mid 100s. 09/28/16 On evaluation today patient appears to continue to exhibit the 3 areas of ulceration that were noted previous. She did have an x-ray of the right pelvis which showed evidence of potential soft tissue infection but no obvious osteomyelitis. There was a discussion last office visit concerning the possibility of a wound VAC. Witth that being said the x-ray report suggested that an MRI may be more appropriate to further evaluate the extent. Subsequently in regard to the wound over the popliteal portion of the left lower extremity with tunneling at 12:00 the CT scan that was ordered was denied by insurance as they state the patient has not had x-rays prior to advanced imaging. Patient states that she is frustrated with the situation overall. 10/05/16 in the interval since I last saw this patient last week she has had the x-ray of the knee performed. I did review that x-ray today and fortunately shows no evidence of osteomyelitis or other acute abnormality at this point in time. She continues to have the opening iin the posterior left popliteal space with tracking proximal up the posterior thigh. Nothing seems to have worsened but it also seems to have not improved. The same is true in regard to the right pressure ulcer over the gluteal region which extends toward the ischium. The left  gluteal pressure ulcer actually appears to be doing somewhat better my opinion there is some necrotic slough but overall this appears fairly well. She tells me thatt she has some discomfort especially when home health is helping her with dressing changes as they do not know her. At worse she rates her pain to be a 5 out of 10 right now it's more like a 1 out of 10. 10/07/16; still the patient has 3 different wound areas. She has a deep stage IV wound over her right ischial tuberosity. She is due to have an MRI next week. The wound over her left ischial tuberosity is more superficial and underwent debridement today. Finally she has a small open area in her left popliteal fossa the probes on measurably forward superiorly. Still a lot of drainage coming out of this. The CT scan that I ordered 3 weeks ago was questioned by her insurance company wanting a plain x-ray first. As I understand things result of this is nothing has been done in 3 weeks in terms of imaging the thigh and she has an MRI booked of this along with her pelvis for next week line 10/12/16; patient has a deep probing wound over the right ischiall tuberosity, stage III wound over the left visual tuberosity and a draining sinus in her left popliteal fossa. None of this much different from when I saw this 3 weeks ago. We have been using silver rope to the right  ischial wound and a draining area in the left popliteal fossa. Plain silver alginate to the area on the left ischial tuberosity 10/19/16; the patient's wounds are essentially unchanged although the area on the left lower gluteal is actually improved. Our intake nurse noted drainage from the right initial tuberosity probing wound as well as the draining area in the left popliteal fossa. Both of these were cultured. She had x-rays I think at the insistence of her insurance company on 09/23/16 x-ray of the pelvis was not particularly helpful she did have soft tissue air over the  right lower pelvis although with the depth of this wound this is not surprising. An x-ray of her left knee did not show any specific abnormalities. We are still using silver alginate to these wound areas. Her MRI is booked for 10/27 10/26/16; cultures of the purulent drainage in her right initial tuberosity wound grew moderate Proteus and Hellums, Maral L. ([id number]) few staph aureus. The same organisms were cultured out of the left knee sinus tract posteriorly. The staph aureus is MRSA. I had started her on Augmentin last week I added doxycycline. The MRI of the left lower extremity and pelvis was finally done. The MRI of the femur showed subcutaneous soft tissue swelling edema fluid and myositis in the vastus lateralis muscle but no soft tissue abscess septic arthritis or osteomyelitis. MRI of the pelvis showed the left wound to be more expensive extending down to the bone there was osteomyelitis. Left hamstring tendons were also involved. No septic arthritis involving the hip. The decubitus ulcer on the right side showed no definite osteomyelitis or abscess.. The right hip wound is actually the one the probes 6 cm downward. But the MRI showing infection including osteomyelitis on the left explains the draining sinus in the popliteal fossa on the left. She did have antibiotics in the hospitalization last time and this extended into her nursing home stay but I'm not exactly sure what antibiotics and for what duration. According the patient this did include vancomycin with considerable effort of our staff we are able to get the patient into see Dr. Sampson Goon today. There were transportation difficulties. Her mother had open heart surgery and is in the ICU in [employer] therefore her brother was unable to transport. Dr. Jarrett Ables office graciously arranged time to see her today. From my point of view she is going to require IV vancomycin plus perhaps a third generation cephalosporin. I plan  to keep her on doxycycline and Augmentin until the IV antibiotics can be arranged. 11/02/2016 - Vanessia presents today for management of ulcers; She saw Dr. 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 She is to have the PICC line placed today and receive the initial dose for both antibiotics today. She has yet to receive the offloading chair cushion and/or mattress overlay from home health, apparently this has been a 3 week process. I encouraged her to speak to home health regarding this matter, along with offering home health to contact the wouns care center with any questions or concerns. The left ischial pressure ulcer continues to imporve, while to right ischial ulcer has increased in depth. The popliteal fossa sinus tract remains unmeasurable due to the limitation of depth measurement (tract extends beyond our measuring devices). 11-16-2016 Ms. Gemma presents today for evaluation and management of bilateral ischial stage IV pressure ulcers and sinus tract to the left popliteal fossa. she is under the care of  Dr. Sampson Goon for IV antibiotic therapy; she states that the vancomycin was placed on hold and will be restarted at a lower dose based on her renal function. She continues taking Zosyn in addition to the vancomycin. She also states that she has yet to receive offloading cushions from home health, according to her she does not qualify for these offloading cushions because "the ulcers are unstageable ". We will contact the home health agency today to lend clarity regarding her pressure ulcers. The left popliteal fossa sinus tract continues to be a measurable as it extends beyond the length of our measuring devices.. 11/30/16; the patient is still on vancomycin and Zosyn. The depth of the draining sinus behind her left popliteal fossa is down to 4 cm although there is still serosanguineous drainage  coming out of this. She saw Dr. Sampson Goon of infectious disease yesterday the idea is to weeks more of IV antibiotics and then oral antibiotics although I have not read his note. The area on the left gluteal fold is just about healed. She has a 6 cm draining sinus over the right initial tuberosity although I cannot feel bone at the base of this. As far as the patient is aware she has not had a recheck of her inflammatory markers. 12/07/16; patient is on vancomycin and Zosyn appointment with Dr. Sampson Goon on the 19th at which point the patient expects to have a change in antibiotics. Remarkable improvement over the wound over the left ischial tuberosity which is just about closed. The draining sinus in her popliteal fossa has 0.4 cm in depth. The area on the right ischial tuberosity still probes down 7 cm. This is closed and overall wound dimensions but not depth. 12/14/16; patient is completing her vancomycin and Zosyn and per her she is going to transition to Bactrim and Augmentin for another 3 weeks. The area on her left gluteal fold is closed except for some skin tears. The area behind her left knee is no longer has any depth. The only remaining area that is of clinical concern is on the right gluteal fold probing towards the right ischial tuberosity. Today this measures 6.9 cm in depth. Zeringue, Kaisley L. ([id number]) Very gritty surface 12/21/16; patient is now on Bactrim and Augmentin as directed by infectious disease. This should be for another 2 weeks. The area in her left gluteal fold and left popliteal are closed over and fully healed. Measurements today at 7 cm in the right buttock wound is unchanged from last week. 12/27/16; patient is on Bactrim and Augmentin for another week as directed by infectious disease. She has completed her IV antibiotics. She is not been systemically unwell no fever no chills. The area on her right buttock measured over 6 cm in depth. There is no palpable bone.  No evidence of surrounding soft tissue infection. She is complaining of tongue irritation and has a history of thrush 01/04/17; patient is been back to see Dr. Sampson Goon, her Augmentin was stopped but he continued the Bactrim for another 3 weeks. Depth of the wound is 6.7 cm there is been no major change in either direction. She is not receive the wound VAC from home health I think because of confusion about who is supposed to provided will actually talk to the home health agency today [[facility] The net no major change. 01/18/17; patient obtained her wound VAC about 10 days ago however for some reason it was not actually put on the wound. She is therefore here for [place] to  apply this I guess. No other issues are noted. She is not complaining of pain fever drainage 02/01/17; patient is here now having the wound VAC or 3-4 weeks to a deep pressure area over the right initial tuberosity. This measures 6 cm in depth today which is about half a centimeter better than 2 weeks ago. There is no evidence she is systemically unwell no fever no chills no pain around the area. 02/15/17; I follow this patient every 2 weeks for a deep area over the right ischial tuberosity. This measures 5.5 cm today which is a continued improvement of 1.2 cm from 1/10 and down 0.5 cm from her visit 2 weeks ago 03/01/17; continued difficult area over the right initial tuberosity using the wound VAC. Depth today of 5.1 cm which is improved. Does not appear to be a lot of drainage in the canister. Antibiotics were finally stopped by Dr. Sampson Goon bactrim[]  and inflammatory markers have been repeated 03/15/17; fall this lady every 2 weeks for a difficult area over her lower right gluteal area/ischial tuberosity. Depth today at 4.6 cm. This is a slow but steady improvement in the depth of this wound. Although we have labeled this as a pressure ulcer there may have been an underlying infection here at one point before we saw her. She had  osteomyelitis on the left extensively which is since resolved 03/29/17- patient is here for follow-up evaluation of her right ischial pressure ulcer. She continues with the wound VAC and home health. According to the nurse home health has been using less foams and appropriate and we will instruct accordingly. The patient continues to smoke, approximately 10 cigarettes a day. She has been advised to decrease that in half by her next appointment, with a goal of complete cessation. She states her blood sugars have been consistently less than 150. She states that she spends most of her day position left lateral or prone. She does have an air mattress on her bed, she does not have an mattress for her chair, she states she cannot afford this. 04/12/17; patient is here for evaluation of her right ischial pressure ulcer. We continue to use a wound VAC with minimal improvement today the depth of this measuring 4.4 cm versus 4.6 cm 2 weeks ago. We are using a [employer] wound VAC on this area. There is not excessive drainage no pain. The patient tells me she tries to keep off this in bed but is up in the wheelchair for 2 hours a day. She is limited in no her overall ambulation but is improving and apparently is getting bilateral lower extremity braces which she hopes will improve her ability to walk independently. 05/10/17; Depth at 3.3cm. Improved 05/24/17; depth at 3.5 cm. This is not improved since last time. Not clear if they are using collagen under the foam 06/07/17; depth that 2.9 which is a slight improvement. Still using the wound VAC. 06/21/17; depth is 2.9 cm which is exactly the same as last time. Also the appearance of this wound is completely the same. We have been using silver collagen under a wound VAC. 07/05/17 patient presents today for reevaluation concerning her right Ischial wound. She had switched insurance companies and so it does appear that the Wound VAC needs to be reauthorized which we  are working on this morning. Nonetheless her wound has been saying about the same we have continued to Yoshimura, Jasdeep L. ([id number]) use the silver collagen underneath the wound VAC. She has no discomfort. 07/19/17; patient  follows every 2 weeks for her right if she'll wound. Since I last saw this a month ago her dimensions of come down to 2.6 cm. This is slightly down from a month ago when it was 2.9 cm in 4 months ago it was 4.6 cm. I note that there were insurance company issues with regard to the wound VAC which she apparently is not having on for several weeks now. I think it would be reasonable to change therapies here. Will use silver alginate rope 08/02/17 on evaluation today patient's wound actually appears to be doing significantly better in regard to the depth as compared to her previous evaluation. She is having no discomfort. Unfortunately she never got the Wound VAC reapproved following the insurance issues from several weeks ago. With that being said it does not appear that she needs to requires the Wound VAC anymore and in fact I feel that she is doing better and making greater improvements at this point without it. Fortunately she has no nausea, vomiting, diarrhea, fevers, or chills. 08/16/17; patient's wound depth down to 2.3 cm. She is using silver alginate packing rope. Note that her wound VAC was discontinued due to insurance issues. This is not particularly surprising. She has not been systemically unwell and has no other new complaints 08/30/17; no change in depth. Still at 2.3 cm. Still with the same gritty surface requiring debridement. I've been using silver alginate for quite a period of time although this came down nicely in the last month 10/04/17; the depth of this is 2 cm however with careful inspection under high-intensity light most of the walls of this small probing sinus seemed to be normally epithelialized. I cannot exactly see the base of this however there is been  no drainage. We've been using silver alginate there is no drainage on the dressing when it is removed. 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 There is no evidence of surrounding infection or pain. The patient had many review weakness in her bilateral legs. She is already been to see a neurologist who according the patient told her "you would never walk again" Electronic Signature(s) Signed: 10/04/2017 4:12:03 PM By: Baltazar Najjar MD Entered By: Baltazar Najjar on 10/04/2017 09:29:13 Boschert, [name] ([id number]) -------------------------------------------------------------------------------- Physical Exam Details Patient Name: [name] Date of Service: 10/04/2017 8:45 AM Medical Record Patient Account Number: [id number] Number: Treating RN: Phillis Haggis 08-Oct-1961 (56 y.o. Other Clinician: Date of Birth/Sex: Female) Treating Quinterious Walraven Primary Care Provider: Joen [name] Provider/Extender: G Referring Provider: Tedra Senegal in Treatment: 54 Constitutional Sitting or standing Blood Pressure is within target range for patient.. Pulse regular and within target range for patient.Marland Kitchen Respirations regular, non-labored and within target range.. Temperature is normal and within the target range for the patient.Marland Kitchen appears in no distress. Eyes Conjunctivae clear. No discharge. Respiratory Respiratory effort is easy and symmetric bilaterally. Rate is normal at rest and on room air.. Integumentary (Hair, Skin) No skin rash. Neurological Left knee jerk is intact but not on the right. Both ankle jerks are absent.Marland Kitchen Psychiatric No evidence of depression, anxiety, or agitation. Calm, cooperative, and communicative. Appropriate interactions and affect.. Notes Wound exam; the actual open area over her left initial tuberosity has no open area visibly, this appears to have epithelialized along the wall of the sinus. There is no  visible base to the wound however no drainage is noted either visibly on exam or on the patient's dressing I think this probably could  be fully epithelialized at this point and I don't think specific dressings in this area are necessary. 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 This includes fairly significant i.e. 2-3 out of 5 hip extensor weakness on the right, 3+ out of 5 on the left. She has severe weakness of dorsi and plantar and ankle flexion Electronic Signature(s) Signed: 10/04/2017 4:12:03 PM By: Baltazar Najjar MD Entered By: Baltazar Najjar on 10/04/2017 09:32:31 Grasmick, [name] ([id number]) -------------------------------------------------------------------------------- Physician Orders Details Patient Name: [name] Date of Service: 10/04/2017 8:45 AM Medical Record Patient Account Number: [id number] Number: Treating RN: Phillis Haggis 19-Apr-1961 (56 y.o. Other Clinician: Date of Birth/Sex: Female) Treating Fransisco Messmer Primary Care Provider: Joen [name] Provider/Extender: G Referring Provider: Tedra Sen[place] in Treatment: 27 Verbal / Phone Orders: Yes Clinician: Ashok Cordia, Debi Read Back and Verified: Yes Diagnosis Coding Wound Cleansing Wound #3 Right Gluteal fold o Clean wound with Normal Saline. o Cleanse wound with mild soap and water Follow-up Appointments Wound #3 Right Gluteal fold o Other: - return in 1 month Off-Loading Wound #3 Right Gluteal fold o Turn and reposition every 2 hours Additional Orders / Instructions Wound #3 Right Gluteal fold o Increase protein intake. Home Health Wound #3 Right Gluteal fold o D/C Home Health Services - for wound care Electronic Signature(s) Signed: 10/04/2017 4:12:03 PM By: Baltazar Najjar MD Signed: 10/04/2017 4:27:40 PM By: Alejandro Mulling Entered By: Alejandro Mulling on 10/04/2017 08:52:21 Hocevar, Arilla Elbert Ewings  ([id number]) -------------------------------------------------------------------------------- Problem List Details Patient Name: [name] Date of Service: 10/04/2017 8:45 AM Medical Record Patient Account Number: [id number] Number: Treating RN: Phillis Haggis 08-13-1961 (56 y.o. 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 09/21/2016 09/21/2016 severity Resolved Problems ICD-10 Code Description Active Date Resolved Date L89.323 Pressure ulcer of left buttock, stage 3 09/21/2016 09/21/2016 L89.324 Pressure ulcer of left buttock, stage 4 11/16/2016 11/16/2016 Electronic Signature(s) Signed: 10/04/2017 4:12:03 PM By: Baltazar Najjar MD Raudenbush, [name] ([id number]) Entered By: Baltazar Najjar on 10/04/2017 09:27:32 Sans, [name] ([id number]) -------------------------------------------------------------------------------- Progress Note Details Patient Name: [name] Date of Service: 10/04/2017 8:45 AM Medical Record Patient Account Number: [id number] Number: Treating RN: Phillis Haggis 1961-01-02 (56 y.o. Other Clinician: Date of Birth/Sex: Female) Treating Myrlene Riera Primary Care Provider: Joen [name] Provider/Extender: G Referring Provider: Tedra Sen[place] in Treatment: 54 Subjective History of Present Illness (HPI) The following HPI elements were documented for the patient's wound: Location:  bilateral gluteal ulcerations and left popliteal ulceration with tunneling Quality: adits to intermittent aching to ischial ulcers, no discomfort to sinus tract Severity: right iscial ulcer with increased depth Duration: chronic ulcers to bilateral ischial ulcers and sinus tract Timing: The pain is intermittent in severity as far as how intense it becomes but is present all the time. Manipulationn makes this worse. Context: The wound occurred when the patient had a fall and was unconscious for about 48 hours laying on the floor and she had pressure injury at that stage. Modifying Factors: Other treatment(s) tried include:as noted below she has been seen by visiting wound  care physicians or nurse practitioners and details have been noted Associated Signs and Symptoms: has yet to receive offloading cushions and mattress overlay 56 year old patient who was seen by visiting [name] wound care specialist for a wound on both her buttock and was found to have an unstageable wound on the right buttock for about 2 months. I understand that she had a fall and was laying on the floor for about 48 hours before she was found and taken to the ICU and had a long injury to her gluteal area from pressure and also had broken her right humerus. She has had a right proximal humerus fracture and has been followed up with orthopedics recently. The patient has a past medical history of type 2 diabetes mellitus, paraparesis, acute pyelonephritis, GERD, hypertension, glaucoma, chronic pain, anxiety neurosis, nicotine dependence, COPD. the patient had some debridement done and was to operative was recommended to use Silvadene dressing and offloading. She is a smoker and occasionally smokes a few cigarettes. the patient requested a second opinion for months and is here to discuss her care. 09/21/16; the patient re-presents from home today for review of 3 different wounds. I note that she was seen in the clinic here in July  at which time she had bilateral buttock wounds. It was apparently suggested at that time that she use a wound VAC bridged to both wounds just near the initial tuberosity's bilaterally which she refused. The history was a bit difficult to put together. Apparently this patient became ill at the end of April of this year. She was found sitting on the floor she had apparently been for 2 days and subsequently admitted to hospital from 04/23/16 through 05/02/16 and at that point she was critically ill ultimately having sepsis secondary to UTI, nontraumatic rhabdomyolysis and diabetic ketoacidosis. She had acute renal failure and I think required ICU care including intubation. Patient states her wounds actually started at that point on the bilateral issue tuberosities however in reviewing the discharge summary from 5/8 I see no reference to Falzon, Sala L. ([id number]) wounds at that point. It did state that she had left lower extremity cellulitis however. Reviewing Epic I see no relevant x-rays. It would appear that her discharge creatinine was within the normal range and indeed on 9/15 her creatinine has remained normal. [facility] There the wounds on her bilateral Buttocks were dressed. Only just before her discharge from the nursing facility she developed an "knot" which was interpreted as cellulitis on the posterior aspect of her left knee she was given antibiotics. Apparently sometime late in July a this actually opened and became a wound at home health care was tending to however she is still having purulent drainage coming from this and by my understanding the wound depth is actually become unmeasurable. I am not really clear about what home health has been placing in any of these wound areas. The patient states that is something with silver and it. She is not been systemically unwell no fever or chills her appetite is good. She is a  diabetic poorly controlled however she states that her recent blood sugars at home have been in the low to mid 100s. 09/28/16 On evaluation today patient appears to continue to exhibit the 3 areas of ulceration that were noted previous. She did have an x-ray of the right pelvis which showed evidence of potential soft tissue infection but no obvious osteomyelitis. There was a discussion last office visit  concerning the possibility of a wound VAC. Witth that being said the x-ray report suggested that an MRI may be more appropriate to further evaluate the extent. Subsequently in regard to the wound over the popliteal portion of the left lower extremity with tunneling at 12:00 the CT scan that was ordered was denied by insurance as they state the patient has not had x-rays prior to advanced imaging. Patient states that she is frustrated with the situation overall. 10/05/16 in the interval since I last saw this patient last week she has had the x-ray of the knee performed. I did review that x-ray today and fortunately shows no evidence of osteomyelitis or other acute abnormality at this point in time. She continues to have the opening iin the posterior left popliteal space with tracking proximal up the posterior thigh. Nothing seems to have worsened but it also seems to have not improved. The same is true in regard to the right pressure ulcer over the gluteal region which extends toward the ischium. The left gluteal pressure ulcer actually appears to be doing somewhat better my opinion there is some necrotic slough but overall this appears fairly well. She tells me thatt she has some discomfort especially when home health is helping her with dressing changes as they do not know her. At worse she rates her pain to be a 5 out of 10 right now it's more like a 1 out of 10. 10/07/16; still the patient has 3 different wound areas. She has a deep stage IV wound over her right ischial tuberosity. She is due  to have an MRI next week. The wound over her left ischial tuberosity is more superficial and underwent debridement today. Finally she has a small open area in her left popliteal fossa the probes on measurably forward superiorly. Still a lot of drainage coming out of this. The CT scan that I ordered 3 weeks ago was questioned by her insurance company wanting a plain x-ray first. As I understand things result of this is nothing has been done in 3 weeks in terms of imaging the thigh and she has an MRI booked of this along with her pelvis for next week line 10/12/16; patient has a deep probing wound over the right ischiall tuberosity, stage III wound over the left visual tuberosity and a draining sinus in her left popliteal fossa. None of this much different from when I saw this 3 weeks ago. We have been using silver rope to the right ischial wound and a draining area in the left popliteal fossa. Plain silver alginate to the area on the left ischial tuberosity 10/19/16; the patient's wounds are essentially unchanged although the area on the left lower gluteal is actually improved. Our intake nurse noted drainage from the right initial tuberosity probing wound as well as the draining area in the left popliteal fossa. Both of these were cultured. She had x-rays I think at the insistence of her insurance company on 09/23/16 x-ray of the pelvis was not particularly helpful she did have soft tissue air over the right lower pelvis although with the depth of this wound this is not surprising. An x-ray of her left knee did not show any specific abnormalities. We are still using silver alginate to these wound areas. Her MRI is booked for 10/27 10/26/16; cultures of the purulent drainage in her right initial tuberosity wound grew moderate Proteus and Venturino, Zakyla L. ([id number]) few staph aureus. The same organisms were cultured out of the left knee sinus  tract posteriorly. The staph aureus is MRSA. I had started  her on Augmentin last week I added doxycycline. The MRI of the left lower extremity and pelvis was finally done. The MRI of the femur showed subcutaneous soft tissue swelling edema fluid and myositis in the vastus lateralis muscle but no soft tissue abscess septic arthritis or osteomyelitis. MRI of the pelvis showed the left wound to be more expensive extending down to the bone there was osteomyelitis. Left hamstring tendons were also involved. No septic arthritis involving the hip. The decubitus ulcer on the right side showed no definite osteomyelitis or abscess.. The right hip wound is actually the one the probes 6 cm downward. But the MRI showing infection including osteomyelitis on the left explains the draining sinus in the popliteal fossa on the left. She did have antibiotics in the hospitalization last time and this extended into her nursing home stay but I'm not exactly sure what antibiotics and for what duration. According the patient this did include vancomycin with considerable effort of our staff we are able to get the patient into see Dr. Sampson Goon today. There were transportation difficulties. Her mother had open heart surgery and is in the ICU in [place] therefore her brother was unable to transport. Dr. Jarrett Ables office graciously arranged time to see her today. From my point of view she is going to require IV vancomycin plus perhaps a third generation cephalosporin. I plan to keep her on doxycycline and Augmentin until the IV antibiotics can be arranged. 11/02/2016 - Allean presents today for management of ulcers; She saw Dr. 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 She is to have the PICC line placed today and receive the initial dose for both antibiotics today. She has yet to receive the offloading chair cushion and/or mattress overlay from home health, apparently this has  been a 3 week process. I encouraged her to speak to home health regarding this matter, along with offering home health to contact the wouns care center with any questions or concerns. The left ischial pressure ulcer continues to imporve, while to right ischial ulcer has increased in depth. The popliteal fossa sinus tract remains unmeasurable due to the limitation of depth measurement (tract extends beyond our measuring devices). 11-16-2016 Ms. Filler presents today for evaluation and management of bilateral ischial stage IV pressure ulcers and sinus tract to the left popliteal fossa. she is under the care of Dr. Sampson Goon for IV antibiotic therapy; she states that the vancomycin was placed on hold and will be restarted at a lower dose based on her renal function. She continues taking Zosyn in addition to the vancomycin. She also states that she has yet to receive offloading cushions from home health, according to her she does not qualify for these offloading cushions because "the ulcers are unstageable ". We will contact the home health agency today to lend clarity regarding her pressure ulcers. The left popliteal fossa sinus tract continues to be a measurable as it extends beyond the length of our measuring devices.. 11/30/16; the patient is still on vancomycin and Zosyn. The depth of the draining sinus behind her left popliteal fossa is down to 4 cm although there is still serosanguineous drainage coming out of this. She saw Dr. Sampson Goon of infectious disease yesterday the idea is to weeks more of IV antibiotics and then oral antibiotics although I have not read his note. The area on the left gluteal fold  is just about healed. She has a 6 cm draining sinus over the right initial tuberosity although I cannot feel bone at the base of this. As far as the patient is aware she has not had a recheck of her inflammatory markers. 12/07/16; patient is on vancomycin and Zosyn appointment with Dr.  Sampson Goon on the 19th at which point the patient expects to have a change in antibiotics. Remarkable improvement over the wound over the left ischial tuberosity which is just about closed. The draining sinus in her popliteal fossa has 0.4 cm in depth. The area on the right ischial tuberosity still probes down 7 cm. This is closed and overall wound dimensions but not depth. 12/14/16; patient is completing her vancomycin and Zosyn and per her she is going to transition to Bactrim and Augmentin for another 3 weeks. The area on her left gluteal fold is closed except for some skin tears. The area behind her left knee is no longer has any depth. The only remaining area that is of clinical concern is on the right gluteal fold probing towards the right ischial tuberosity. Today this measures 6.9 cm in depth. Warman, Evangelene L. ([id number]) Very gritty surface 12/21/16; patient is now on Bactrim and Augmentin as directed by infectious disease. This should be for another 2 weeks. The area in her left gluteal fold and left popliteal are closed over and fully healed. Measurements today at 7 cm in the right buttock wound is unchanged from last week. 12/27/16; patient is on Bactrim and Augmentin for another week as directed by infectious disease. She has completed her IV antibiotics. She is not been systemically unwell no fever no chills. The area on her right buttock measured over 6 cm in depth. There is no palpable bone. No evidence of surrounding soft tissue infection. She is complaining of tongue irritation and has a history of thrush 01/04/17; patient is been back to see Dr. Sampson Goon, her Augmentin was stopped but he continued the Bactrim for another 3 weeks. Depth of the wound is 6.7 cm there is been no major change in either direction. She is not receive the wound VAC from home health I think because of confusion about who is supposed to provided will actually talk to the home health agency today [[facility]  The net no major change. 01/18/17; patient obtained her wound VAC about 10 days ago however for some reason it was not actually put on the wound. She is therefore here for [place] to apply this I guess. No other issues are noted. She is not complaining of pain fever drainage 02/01/17; patient is here now having the wound VAC or 3-4 weeks to a deep pressure area over the right initial tuberosity. This measures 6 cm in depth today which is about half a centimeter better than 2 weeks ago. There is no evidence she is systemically unwell no fever no chills no pain around the area. 02/15/17; I follow this patient every 2 weeks for a deep area over the right ischial tuberosity. This measures 5.5 cm today which is a continued improvement of 1.2 cm from 1/10 and down 0.5 cm from her visit 2 weeks ago 03/01/17; continued difficult area over the right initial tuberosity using the wound VAC. Depth today of 5.1 cm which is improved. Does not appear to be a lot of drainage in the canister. Antibiotics were finally stopped by Dr. Sampson Goon bactrim[]  and inflammatory markers have been repeated 03/15/17; fall this lady every 2 weeks for a difficult area  over her lower right gluteal area/ischial tuberosity. Depth today at 4.6 cm. This is a slow but steady improvement in the depth of this wound. Although we have labeled this as a pressure ulcer there may have been an underlying infection here at one point before we saw her. She had osteomyelitis on the left extensively which is since resolved 03/29/17- patient is here for follow-up evaluation of her right ischial pressure ulcer. She continues with the wound VAC and home health. According to the nurse home health has been using less foams and appropriate and we will instruct accordingly. The patient continues to smoke, approximately 10 cigarettes a day. She has been advised to decrease that in half by her next appointment, with a goal of complete cessation. She states her blood  sugars have been consistently less than 150. She states that she spends most of her day position left lateral or prone. She does have an air mattress on her bed, she does not have an mattress for her chair, she states she cannot afford this. 04/12/17; patient is here for evaluation of her right ischial pressure ulcer. We continue to use a wound VAC with minimal improvement today the depth of this measuring 4.4 cm versus 4.6 cm 2 weeks ago. We are using a [employer] wound VAC on this area. There is not excessive drainage no pain. The patient tells me she tries to keep off this in bed but is up in the wheelchair for 2 hours a day. She is limited in no her overall ambulation but is improving and apparently is getting bilateral lower extremity braces which she hopes will improve her ability to walk independently. 05/10/17; Depth at 3.3cm. Improved 05/24/17; depth at 3.5 cm. This is not improved since last time. Not clear if they are using collagen under the foam 06/07/17; depth that 2.9 which is a slight improvement. Still using the wound VAC. 06/21/17; depth is 2.9 cm which is exactly the same as last time. Also the appearance of this wound is completely the same. We have been using silver collagen under a wound VAC. 07/05/17 patient presents today for reevaluation concerning her right Ischial wound. She had switched insurance companies and so it does appear that the Wound VAC needs to be reauthorized which we are working on this morning. Nonetheless her wound has been saying about the same we have continued to Helman, Shelba L. ([id number]) use the silver collagen underneath the wound VAC. She has no discomfort. 07/19/17; patient follows every 2 weeks for her right if she'll wound. Since I last saw this a month ago her dimensions of come down to 2.6 cm. This is slightly down from a month ago when it was 2.9 cm in 4 months ago it was 4.6 cm. I note that there were insurance company issues with regard to the wound  VAC which she apparently is not having on for several weeks now. I think it would be reasonable to change therapies here. Will use silver alginate rope 08/02/17 on evaluation today patient's wound actually appears to be doing significantly better in regard to the depth as compared to her previous evaluation. She is having no discomfort. Unfortunately she never got the Wound VAC reapproved following the insurance issues from several weeks ago. With that being said it does not appear that she needs to requires the Wound VAC anymore and in fact I feel that she is doing better and making greater improvements at this point without it. Fortunately she has no nausea, vomiting,  diarrhea, fevers, or chills. 08/16/17; patient's wound depth down to 2.3 cm. She is using silver alginate packing rope. Note that her wound VAC was discontinued due to insurance issues. This is not particularly surprising. She has not been systemically unwell and has no other new complaints 08/30/17; no change in depth. Still at 2.3 cm. Still with the same gritty surface requiring debridement. I've been using silver alginate for quite a period of time although this came down nicely in the last month 10/04/17; the depth of this is 2 cm however with careful inspection under high-intensity light most of the walls of this small probing sinus seemed to be normally epithelialized. I cannot exactly see the base of this however there is been no drainage. We've been using silver alginate there is no drainage on the dressing when it is removed. 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 There is no evidence of surrounding infection or pain. The patient had many review weakness in her bilateral legs. She is already been to see a neurologist who according the patient told her "you would never walk again" Objective Constitutional Sitting or standing Blood Pressure is within target range for patient..  Pulse regular and within target range for patient.Marland Kitchen Respirations regular, non-labored and within target range.. Temperature is normal and within the target range for the patient.Marland Kitchen appears in no distress. Vitals Time Taken: 8:18 AM, Height: 63 in, Weight: 257 lbs, BMI: 45.5, Temperature: 98.3 F, Pulse: 101 bpm, Respiratory Rate: 18 breaths/min, Blood Pressure: 123/64 mmHg. Eyes Conjunctivae clear. No discharge. Respiratory Respiratory effort is easy and symmetric bilaterally. Rate is normal at rest and on room air.Elesa Massed, Elexis L. ([id number]) Neurological Left knee jerk is intact but not on the right. Both ankle jerks are absent.Marland Kitchen Psychiatric No evidence of depression, anxiety, or agitation. Calm, cooperative, and communicative. Appropriate interactions and affect.. General Notes: Wound exam; the actual open area over her left initial tuberosity has no open area visibly, this appears to have epithelialized along the wall of the sinus. There is no visible base to the wound however no drainage is noted either visibly on exam or on the patient's dressing I think this probably could be fully epithelialized at this point and I don't think specific dressings in this area are necessary. 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 This includes fairly significant i.e. 2-3 out of 5 hip extensor weakness on the right, 3+ out of 5 on the left. She has severe weakness of dorsi and plantar and ankle flexion Integumentary (Hair, Skin) No skin rash. Wound #3 status is Open. Original cause of wound was Pressure Injury. The wound is located on the Right Gluteal fold. The wound measures 0.2cm length x 0.9cm width x 2cm depth; 0.141cm^2 area and 0.283cm^3 volume. There is Fat Layer (Subcutaneous Tissue) Exposed exposed. There is no tunneling or undermining noted. There is a none present amount of drainage noted. Foul odor after cleansing was noted. The wound margin is distinct with the  outline attached to the wound base. There is medium (34-66%) red granulation within the wound bed. There is a medium (34-66%) amount of necrotic tissue within the wound bed including Adherent Slough. The periwound skin appearance exhibited: Scarring, Dry/Scaly. The periwound skin appearance did not exhibit: Callus, Crepitus, Excoriation, Induration, Rash, Maceration, Atrophie Blanche, Cyanosis, Ecchymosis, Hemosiderin Staining, Mottled, Pallor, Rubor, Erythema. Periwound temperature was noted as No Abnormality. The periwound has tenderness on palpation. 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 ([id number]) Wound #3 Right Gluteal fold: Clean wound with Normal Saline. Cleanse wound with mild soap and water Follow-up Appointments: Wound #3 Right Gluteal fold: Other: - return in 1 month Off-Loading: Wound #3 Right Gluteal fold: Turn and reposition every 2 hours Additional Orders / Instructions: Wound #3 Right Gluteal fold: Increase protein intake. Home Health: Wound #3 Right Gluteal fold: D/C Home Health Services - for wound care #1 I think the wound in the right ischial tuberosity may be "healed". I have not recommended a specific dressing. I would like to see this area again in the month but have asked the patient to call if there is drainage pain or something seems new. This would not be classed as an ideal outcome however as long as this holds together I think this would be satisfactory. 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 I'll see if I can see her neurologic assessment o o o Electronic Signature(s) Signed: 10/04/2017 4:12:03 PM By: Baltazar Najjar MD Entered By: Baltazar Najjar on 10/04/2017 09:35:08 Chaires, [name]  ([id number]) -------------------------------------------------------------------------------- SuperBill Details Patient Name: [name] Date of Service: 10/04/2017 Medical Record Patient Account Number: [id number] Number: Treating RN: Phillis Haggis 14-Aug-1961 (56 y.o. 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

## 2017-10-07 NOTE — Progress Notes (Signed)
LUNETTE, TAPP ([id number]) Visit Report for 10/04/2017 Arrival Information Details Patient Name: [name], [name] Date of Service: 10/04/2017 8:45 AM Medical Record Patient Account Number: [id number] Number: Treating RN: Ahmed Prima 12-Jun-1961 (56 y.o. Other Clinician: Date of Birth/Sex: Female) Treating ROBSON, MICHAEL Primary Care Arloa Prak: Lavera Guise Zack Crager/Extender: G Referring Eulalie Speights: Sallee Lange in Treatment: 7 Visit Information History Since Last Visit All ordered tests and consults were completed: No Patient Arrived: Wheel Chair Added or deleted any medications: No Arrival Time: 08:16 Any new allergies or adverse reactions: No Accompanied By: self Had a fall or experienced change in No Transfer Assistance: EasyPivot activities of daily living that may affect Patient Lift risk of falls: Patient Identification Verified: Yes Signs or symptoms of abuse/neglect since last No Secondary Verification Process Yes visito Completed: Hospitalized since last visit: No Patient Requires Transmission- No Has Dressing in Place as Prescribed: Yes Based Precautions: Pain Present Now: No Patient Has Alerts: Yes Patient Alerts: DM II Electronic Signature(s) Signed: 10/04/2017 4:27:40 PM By: Alric Quan Entered By: Alric Quan on 10/04/2017 08:18:40 Surman, Ayane Carlean Jews ([id number]) -------------------------------------------------------------------------------- Clinic Level of Care Assessment Details Patient Name: [name] Date of Service: 10/04/2017 8:45 AM Medical Record Patient Account Number: [id number] Number: Treating RN: Ahmed Prima 28-Aug-1961 (56 y.o. Other Clinician: Date of Birth/Sex: Female) Treating ROBSON, [place] Primary Care Taysom Glymph: Lavera Guise Miller [place]/Extender: G Referring Jeanmarie Mccowen: Sallee Lange in Treatment: 58 Clinic Level of Care Assessment Items TOOL 4 Quantity Score X - Use when only an EandM  is performed on FOLLOW-UP visit 1 0 ASSESSMENTS - Nursing Assessment / Reassessment X - Reassessment of Co-morbidities (includes updates in patient status) 1 10 X - Reassessment of Adherence to Treatment Plan 1 5 ASSESSMENTS - Wound and Skin Assessment / Reassessment X - Simple Wound Assessment / Reassessment - one wound 1 5 []  - Complex Wound Assessment / Reassessment - multiple wounds 0 []  - Dermatologic / Skin Assessment (not related to wound area) 0 ASSESSMENTS - Focused Assessment []  - Circumferential Edema Measurements - multi extremities 0 []  - Nutritional Assessment / Counseling / Intervention 0 []  - Lower Extremity Assessment (monofilament, tuning fork, pulses) 0 []  - Peripheral Arterial Disease Assessment (using hand held doppler) 0 ASSESSMENTS - Ostomy and/or Continence Assessment and Care []  - Incontinence Assessment and Management 0 []  - Ostomy Care Assessment and Management (repouching, etc.) 0 PROCESS - Coordination of Care []  - Simple Patient / Family Education for ongoing care 0 X - Complex (extensive) Patient / Family Education for ongoing care 1 20 X - Staff obtains Programmer, systems, Records, Test Results / Process Orders 1 10 X - Staff telephones HHA, Nursing Homes / Clarify orders / etc 1 10 Kolenovic, Tyann L. ([id number]) []  - Routine Transfer to another Facility (non-emergent condition) 0 []  - Routine Hospital Admission (non-emergent condition) 0 []  - New Admissions / Biomedical engineer / Ordering NPWT, Apligraf, etc. 0 []  - Emergency Hospital Admission (emergent condition) 0 X - Simple Discharge Coordination 1 10 []  - Complex (extensive) Discharge Coordination 0 PROCESS - Special Needs []  - Pediatric / Minor Patient Management 0 []  - Isolation Patient Management 0 []  - Hearing / Language / Visual special needs 0 []  - Assessment of Community assistance (transportation, D/C planning, etc.) 0 []  - Additional assistance / Altered mentation 0 []  - Support Surface(s)  Assessment (bed, cushion, seat, etc.) 0 INTERVENTIONS - Wound Cleansing / Measurement X - Simple Wound Cleansing - one wound 1 5 []  - Complex  Wound Cleansing - multiple wounds 0 X - Wound Imaging (photographs - any number of wounds) 1 5 []  - Wound Tracing (instead of photographs) 0 X - Simple Wound Measurement - one wound 1 5 []  - Complex Wound Measurement - multiple wounds 0 INTERVENTIONS - Wound Dressings X - Small Wound Dressing one or multiple wounds 1 10 []  - Medium Wound Dressing one or multiple wounds 0 []  - Large Wound Dressing one or multiple wounds 0 X - Application of Medications - topical 1 5 []  - Application of Medications - injection 0 Gruenewald, Anitta L. ([id number]) INTERVENTIONS - Miscellaneous []  - External ear exam 0 []  - Specimen Collection (cultures, biopsies, blood, body fluids, etc.) 0 []  - Specimen(s) / Culture(s) sent or taken to Lab for analysis 0 []  - Patient Transfer (multiple staff / [name] / Similar devices) 0 []  - Simple Staple / Suture removal (25 or less) 0 []  - Complex Staple / Suture removal (26 or more) 0 []  - Hypo / Hyperglycemic Management (close monitor of Blood Glucose) 0 []  - Ankle / Brachial Index (ABI) - do not check if billed separately 0 X - Vital Signs 1 5 Has the patient been seen at the hospital within the last three years: Yes Total Score: 105 Level Of Care: New/Established - Level 3 Electronic Signature(s) Signed: 10/04/2017 4:27:40 PM By: Alric Quan Entered By: Alric Quan on 10/04/2017 10:04:32 Axtell, [name] ([id number]) -------------------------------------------------------------------------------- Encounter Discharge Information Details Patient Name: [name] Date of Service: 10/04/2017 8:45 AM Medical Record Patient Account Number: [id number] Number: Treating RN: Ahmed Prima 07-Jun-1961 (56 y.o. Other Clinician: Date of Birth/Sex: Female) Treating ROBSON, MICHAEL Primary Care Logen Heintzelman: Lavera Guise Suleyman Ehrman/Extender: G Referring Kimberl Vig: Sallee Lange in Treatment: 62 Encounter Discharge Information Items Discharge Pain Level: 0 Discharge Condition: Stable Ambulatory Status: Wheelchair Discharge Destination: Home Transportation: Other Accompanied By: self Schedule Follow-up Appointment: Yes Medication Reconciliation completed No and provided to Patient/Care Solan Vosler: Patient Clinical Summary of Care: Declined Electronic Signature(s) Signed: 10/06/2017 3:19:14 PM By: Ruthine Dose Entered By: Ruthine Dose on 10/04/2017 08:59:53 Vinal, [name] ([id number]) -------------------------------------------------------------------------------- Lower Extremity Assessment Details Patient Name: [name] Date of Service: 10/04/2017 8:45 AM Medical Record Patient Account Number: [id number] Number: Treating RN: Ahmed Prima 1961/11/28 (56 y.o. Other Clinician: Date of Birth/Sex: Female) Treating ROBSON, MICHAEL Primary Care Keonda Dow: Lavera Guise Terresa Marlett/Extender: G Referring Dimitrius Steedman: Sallee Lange in Treatment: 54 Electronic Signature(s) Signed: 10/04/2017 4:27:40 PM By: Alric Quan Entered By: Alric Quan on 10/04/2017 08:24:31 Valin, Soley Carlean Jews ([id number]) -------------------------------------------------------------------------------- Multi Wound Chart Details Patient Name: [name] Date of Service: 10/04/2017 8:45 AM Medical Record Patient Account Number: [id number] Number: Treating RN: Ahmed Prima 03-Feb-1961 (56 y.o. Other Clinician: Date of Birth/Sex: Female) Treating ROBSON, MICHAEL Primary Care Betsy Rosello: Lavera Guise Yaritzel Stange/Extender: G Referring Cythnia Osmun: Sallee Lange in Treatment: 54 Vital Signs Height(in): 63 Pulse(bpm): 101 Weight(lbs): 257 Blood Pressure 123/64 (mmHg): Body Mass Index(BMI): 46 Temperature(F): 98.3 Respiratory Rate 18 (breaths/min): Photos: [3:No Photos]  [N/A:N/A] Wound Location: [3:Right Gluteal fold] [N/A:N/A] Wounding Event: [3:Pressure Injury] [N/A:N/A] Primary Etiology: [3:Pressure Ulcer] [N/A:N/A] Comorbid History: [3:Asthma, Hypertension, Type II Diabetes, Neuropathy] [N/A:N/A] Date Acquired: [3:04/21/2016] [N/A:N/A] Weeks of Treatment: [3:54] [N/A:N/A] Wound Status: [3:Open] [N/A:N/A] Measurements L x W x D 0.2x0.9x2 [N/A:N/A] (cm) Area (cm) : [3:0.141] [N/A:N/A] Volume (cm) : [3:0.283] [N/A:N/A] % Reduction in Area: [3:95.40%] [N/A:N/A] % Reduction in Volume: 98.40% [N/A:N/A] Classification: [3:Category/Stage IV] [N/A:N/A] Exudate Amount: [3:None Present] [N/A:N/A] Foul Odor  After [3:Yes] [N/A:N/A] Cleansing: Odor Anticipated Due to No [N/A:N/A] Product Use: Wound Margin: [3:Distinct, outline attached] [N/A:N/A] Granulation Amount: [3:Medium (34-66%)] [N/A:N/A] Granulation Quality: [3:Red] [N/A:N/A] Necrotic Amount: [3:Medium (34-66%)] [N/A:N/A] Exposed Structures: [3:Fat Layer (Subcutaneous Tissue) Exposed: Yes] [N/A:N/A] Epithelialization: Small (1-33%) N/A N/A Periwound Skin Texture: Scarring: Yes N/A N/A Excoriation: No Induration: No Callus: No Crepitus: No Rash: No Periwound Skin Dry/Scaly: Yes N/A N/A Moisture: Maceration: No Periwound Skin Color: Atrophie Blanche: No N/A N/A Cyanosis: No Ecchymosis: No Erythema: No Hemosiderin Staining: No Mottled: No Pallor: No Rubor: No Temperature: No Abnormality N/A N/A Tenderness on Yes N/A N/A Palpation: Wound Preparation: Ulcer Cleansing: N/A N/A Rinsed/Irrigated with Saline Topical Anesthetic Applied: Other: lidocaine 4% Treatment Notes Wound #3 (Right Gluteal fold) 1. Cleansed with: Clean wound with Normal Saline 2. Anesthetic Topical Lidocaine 4% cream to wound bed prior to debridement Electronic Signature(s) Signed: 10/04/2017 4:12:03 PM By: Linton Ham MD Entered By: Linton Ham on 10/04/2017 09:27:42 Everman, [name]  ([id number]) -------------------------------------------------------------------------------- [place] Details Patient Name: [name] Date of Service: 10/04/2017 8:45 AM Medical Record Patient Account Number: [id number] Number: Treating RN: Ahmed Prima [birth year] (56 y.o. Other Clinician: Date of Birth/Sex: Female) Treating ROBSON, MICHAEL Primary Care Seann Genther: Lavera Guise Benelli Winther/Extender: G Referring Kieley Akter: Sallee Lange in Treatment: 74 Active Inactive ` Abuse / Safety / Falls / Self Care Management Nursing Diagnoses: Potential for falls Goals: Patient will remain injury free Date Initiated: 09/21/2016 Target Resolution Date: 03/25/2017 Goal Status: Active Interventions: Assess fall risk on admission and as needed Notes: ` Nutrition Nursing Diagnoses: Imbalanced nutrition Potential for alteratiion in Nutrition/Potential for imbalanced nutrition Goals: Patient/caregiver agrees to and verbalizes understanding of need to use nutritional supplements and/or vitamins as prescribed Date Initiated: 09/21/2016 Target Resolution Date: 03/25/2017 Goal Status: Active Patient/caregiver verbalizes understanding of need to maintain therapeutic glucose control per primary care physician Date Initiated: 09/21/2016 Target Resolution Date: 03/25/2017 Goal Status: Active Interventions: Assess patient nutrition upon admission and as needed per policy Pizzimenti, [place]. ([id number]) Notes: ` Orientation to the Wound Care Program Nursing Diagnoses: Knowledge deficit related to the wound healing center program Goals: Patient/caregiver will verbalize understanding of the [place] Date Initiated: 09/21/2016 Target Resolution Date: 03/25/2017 Goal Status: Active Interventions: Provide education on orientation to the wound center Notes: ` Pain, Acute or Chronic Nursing Diagnoses: Pain, acute or chronic: actual or  potential Potential alteration in comfort, pain Goals: Patient will verbalize adequate pain control and receive pain control interventions during procedures as needed Date Initiated: 09/21/2016 Target Resolution Date: 03/25/2017 Goal Status: Active Patient/caregiver will verbalize adequate pain control between visits Date Initiated: 09/21/2016 Target Resolution Date: 03/25/2017 Goal Status: Active Patient/caregiver will verbalize comfort level met Date Initiated: 09/21/2016 Target Resolution Date: 03/25/2017 Goal Status: Active Interventions: Assess comfort goal upon admission Complete pain assessment as per visit requirements Notes: ` Wound/Skin Impairment Nursing Diagnoses: MONALISA, BAYLESS ([id number]) Impaired tissue integrity Goals: Ulcer/skin breakdown will have a volume reduction of 30% by week 4 Date Initiated: 09/21/2016 Target Resolution Date: 03/25/2017 Goal Status: Active Ulcer/skin breakdown will have a volume reduction of 50% by week 8 Date Initiated: 09/21/2016 Target Resolution Date: 03/25/2017 Goal Status: Active Ulcer/skin breakdown will have a volume reduction of 80% by week 12 Date Initiated: 09/21/2016 Target Resolution Date: 03/25/2017 Goal Status: Active Interventions: Assess ulceration(s) every visit Notes: Electronic Signature(s) Signed: 10/04/2017 4:27:40 PM By: Alric Quan Entered By: Alric Quan on 10/04/2017 08:24:39 Niederer, Krum ([id number]) -------------------------------------------------------------------------------- Pain Assessment  Details Patient Name: MILANA, SALAY Date of Service: 10/04/2017 8:45 AM Medical Record Patient Account Number: [id number] Number: Treating RN: Ahmed Prima [birth year] (56 y.o. Other Clinician: Date of Birth/Sex: Female) Treating ROBSON, MICHAEL Primary Care Skyrah Krupp: Lavera Guise Erickson Yamashiro/Extender: G Referring Mariadejesus Cade: Sallee Lange in Treatment: 55 Active Problems Location of  Pain Severity and Description of Pain Patient Has Paino No Site Locations Pain Management and Medication Current Pain Management: Electronic Signature(s) Signed: 10/04/2017 4:27:40 PM By: Alric Quan Entered By: Alric Quan on 10/04/2017 08:18:47 [place], [name] ([id number]) -------------------------------------------------------------------------------- Patient/Caregiver Education Details Patient Name: [name] Date of Service: 10/04/2017 8:45 AM Medical Record Patient Account Number: [id number] Number: Treating RN: Ahmed Prima 1961/10/07 (55 y.o. Other Clinician: Date of Birth/Gender: Female) Treating ROBSON, MICHAEL Primary Care Physician: Lavera Guise Physician/Extender: G Referring Physician: Sallee Lange in Treatment: 26 Education Assessment Education Provided To: Patient Education Topics Provided Wound/Skin Impairment: Handouts: Other: change dressing as ordered Methods: Demonstration, Explain/Verbal Responses: State content correctly Electronic Signature(s) Signed: 10/04/2017 4:27:40 PM By: Alric Quan Entered By: Alric Quan on 10/04/2017 08:53:58 Burmester, Emmerie Carlean Jews ([id number]) -------------------------------------------------------------------------------- Wound Assessment Details Patient Name: [name] Date of Service: 10/04/2017 8:45 AM Medical Record Patient Account Number: [id number] Number: Treating RN: Ahmed Prima [birth year] (55 y.o. Other Clinician: Date of Birth/Sex: Female) Treating ROBSON, MICHAEL Primary Care Ulrich Soules: Lavera Guise Aniqa Hare/Extender: G Referring Demontay Grantham: Sallee Lange in Treatment: 48 Wound Status Wound Number: 3 Primary Pressure Ulcer Etiology: Wound Location: Right Gluteal fold Wound Status: Open Wounding Event: Pressure Injury Comorbid Asthma, Hypertension, Type II Date Acquired: 04/21/2016 History: Diabetes, Neuropathy Weeks Of Treatment: 54 Clustered  Wound: No Photos Photo Uploaded By: Alric Quan on 10/04/2017 10:48:01 Wound Measurements Length: (cm) 0.2 Width: (cm) 0.9 Depth: (cm) 2 Area: (cm) 0.141 Volume: (cm) 0.283 % Reduction in Area: 95.4% % Reduction in Volume: 98.4% Epithelialization: Small (1-33%) Tunneling: No Undermining: No Wound Description Classification: Category/Stage IV Foul Odor Aft Wound Margin: Distinct, outline attached Due to Produc Exudate Amount: None Present Slough/Fibrin er Cleansing: Yes t Use: No o Yes Wound Bed Granulation Amount: Medium (34-66%) Exposed Structure Granulation Quality: Red Fat Layer (Subcutaneous Tissue) Exposed: Yes Necrotic Amount: Medium (34-66%) Necrotic Quality: Adherent Slough Burpee, Leyanna L. ([id number]) Periwound Skin Texture Texture Color No Abnormalities Noted: No No Abnormalities Noted: No Callus: No Atrophie Blanche: No Crepitus: No Cyanosis: No Excoriation: No Ecchymosis: No Induration: No Erythema: No Rash: No Hemosiderin Staining: No Scarring: Yes Mottled: No Pallor: No Moisture Rubor: No No Abnormalities Noted: No Dry / Scaly: Yes Temperature / Pain Maceration: No Temperature: No Abnormality Tenderness on Palpation: Yes Wound Preparation Ulcer Cleansing: Rinsed/Irrigated with Saline Topical Anesthetic Applied: Other: lidocaine 4%, Treatment Notes Wound #3 (Right Gluteal fold) 1. Cleansed with: Clean wound with Normal Saline 2. Anesthetic Topical Lidocaine 4% cream to wound bed prior to debridement Electronic Signature(s) Signed: 10/04/2017 4:27:40 PM By: Alric Quan Entered By: Alric Quan on 10/04/2017 08:24:17 Zeiser, [name] ([id number]) -------------------------------------------------------------------------------- [place] Details Patient Name: [name] Date of Service: 10/04/2017 8:45 AM Medical Record Patient Account Number: [id number] Number: Treating RN: Ahmed Prima [birth year] (56 y.o.  Other Clinician: Date of Birth/Sex: Female) Treating ROBSON, MICHAEL Primary Care Jaden Batchelder: Lavera Guise Alphus Zeck/Extender: G Referring [place] Harral: Sallee Lange in Treatment: 54 Vital Signs Time Taken: 08:18 Temperature (F): 98.3 Height (in): 63 Pulse (bpm): 101 Weight (lbs): 257 Respiratory Rate (breaths/min): 18 Body Mass Index (BMI): 45.5 Blood Pressure (mmHg): 123/64 Reference Range: 80 -  120 mg / dl Electronic Signature(s) Signed: 10/04/2017 4:27:40 PM By: Alric Quan Entered By: Alric Quan on 10/04/2017 08:19:15

## 2017-10-11 ENCOUNTER — Ambulatory Visit: Payer: BLUE CROSS/BLUE SHIELD | Admitting: Physical Therapy

## 2017-10-18 ENCOUNTER — Ambulatory Visit: Payer: BLUE CROSS/BLUE SHIELD | Admitting: Internal Medicine

## 2017-10-24 ENCOUNTER — Ambulatory Visit: Payer: BLUE CROSS/BLUE SHIELD | Attending: Neurology | Admitting: Physical Therapy

## 2017-10-24 ENCOUNTER — Encounter: Payer: Self-pay | Admitting: Physical Therapy

## 2017-10-24 DIAGNOSIS — R262 Difficulty in walking, not elsewhere classified: Secondary | ICD-10-CM | POA: Insufficient documentation

## 2017-10-24 NOTE — Therapy (Signed)
[place] [facility] SERVICES [address] [place], [place], [address] Phone: [phone]   Fax:  [phone]  Physical Therapy Wheelchair Evaluation  Patient Details  Name: [name] MRN: [id number] Date of Birth: 1961-04-17 No Data Recorded  Encounter Date: 10/24/2017      PT End of Session - 10/24/17 1012    Visit Number 1   Number of Visits 1   Date for PT Re-Evaluation 10/24/17   Authorization Type BCBS   PT Start Time 0845   PT Stop Time 0930   PT Time Calculation (min) 45 min   Activity Tolerance Patient tolerated treatment well;No increased pain   Behavior During Therapy WFL for tasks assessed/performed      Past Medical History:  Diagnosis Date  . Acute pain of right shoulder 05/19/2016  . Acute PN (pyelonephritis) 05/18/2016  . Acute pyelonephritis 05/18/2016  . Anxiety   . Arthritis   . Asthma   . Diabetes mellitus without complication ([place])   . GERD (gastroesophageal reflux disease)   . Glaucoma   . Hyperlipemia   . Hyperlipemia   . Hypertension     Past Surgical History:  Procedure Laterality Date  . ANTERIOR CRUCIATE LIGAMENT REPAIR      There were no vitals filed for this visit.     PATIENT INFORMATION: This Evaluation form will serve as the LMN for the following suppliers:  Supplier: [employer] Contact Person: Delton See, ATP Phone: [phone]   Reason for Referral: Wheelchair evaluation for mobility;  Patient/caregiver Goals: "I want to get a power wheelchair for better mobility in my home."  Patient was seen for face-to-face evaluation for new power wheelchair.  Also present was   Delton See, ATP           to discuss recommendations and wheelchair options.  Further paperwork was completed and sent to vendor.  Patient appears to qualify for custom cushion/Roho mobility device at this time per objective findings.   MEDICAL HISTORY: Diagnosis: April 2017- diagnosed with 4 strains of UTI,  turned to sepsis; Rhabdomyolysis; acute diabetic episode; LE neuropathy; history of CVA- showed up on MRI no symptoms present;   Primary Diagnosis Onset: April 2017 []Progressive Disease Relevant Past and Future Surgeries:None Height: 5'3" Weight: 260# Explain and recent changes or trends in weight: none  Relevant History including falls: No recent falls in last 3 months; has PMH: HTN, diabetes, depression, stroke, rheumatoid arthritis;  Fell in May/June 2017 with right shoulder fracture- which has since healed;      HOME ENVIRONMENT: [x]House  []Condo/town home  []Apartment  []Assisted Living    [x]Lives Alone [] Lives with Others                                                    Hours with caregiver:   [x]Home is accessible to patient  ; able to get outside easily with ramps;          Stairs  []Yes [x] No     Ramp [x]Yes []No Comments:  Has rails on B side   COMMUNITY ADL: TRANSPORTATION: []Car    []Van    [x]Public Transportation    []Adapted w/c Lift   []Ambulance   []Other:       []Sits in wheelchair during transport  Employment/School:  Specific requirements pertaining to mobility                                                     Other:                                       FUNCTIONAL/SENSORY PROCESSING SKILLS:  Handedness:   [x]Right     []Left    []NA  Comments:                                 Functional Processing Skills for Wheeled Mobility [x]Processing Skills are adequate for safe wheelchair operation  Areas of concern than may interfere with safe operation of wheelchair Description of problem   [] Attention to environment     []Judgment     [] Hearing  [] Vision or visual processing    []Motor Planning  [] Fluctuations in Behavior                                                   VERBAL COMMUNICATION: [x]WFL receptive [x] WFL expressive []Understandable  []Difficult to understand  []non-communicative [] Uses an augmented communication  device    CURRENT SEATING / MOBILITY: Current Mobility Base:   []None  []Dependent  [x]Manual  []Scooter  []Power   Type of Control:                       Manufacturer:   Drive  Size:   Bariatric/extra wide   Age:  Since end of August 2017                         Current Condition of Mobility Base: works well;no problems                                                                                                                    Current Wheelchair components: has bilateral leg rests, regular locks, removable arm rests;    Doesn't have any cushion in manual wheelchair at this time;  Describe posture in present seating system:  Sits with mild slumped posture, able to self correct with verbal cues;                                                                           SENSATION and SKIN ISSUES: Sensation []Intact [x]Impaired []Absent   Level of sensation:    Intact light touch in BLE except loss of light touch mid shin to foot; loss of deep pressure in feet;                        Pressure Relief: Able to perform effective pressure relief :   [x]Yes  [] No Method:                                                                             Will transfer to bed/couch as needed; does a squat pivot transfer to bed;   If not, Why?:                                                                          Skin Issues/Skin Integrity Current Skin Issues   [x]Yes []No  []Intact [] Red area [x] Open Area  []Scar Tissue []At risk from prolonged sitting  Where  Wounds around gluteal folds: R: stage IV with tunnel; L: healed no open area;                             History of Skin Issues   [x]Yes []No  Where   B gluteal fold pressure wounds, grade IV                                      When    May 2016                                           Hx of skin  flap surgeries []Yes [x]No  Where                  When                                                  Limited sitting tolerance []Yes [x]No Hours spent sitting in wheelchair daily: 1-2 hours  at a time;                                                         Complaint of Pain:  Please describe:   8/10 foot pain bilaterally due to neuropathy                                                                                                          Swelling/Edema:  None recent;                                                                                                                                             ADL STATUS (in reference to wheelchair use):  Indep Assist Unable Indep with Equip Not assessed Comments  Dressing    X                                                               Eating    X                                                                                                                     Toileting     X  Bathing            X                                                                              Doing a sponge bath as she can't get into bathroom due to doors; PT did help patient take a shower last week; used shower seat;                                             Grooming/ Hygiene     X                                                                                                                         Meal Prep      X                                                                                                                    IADLS    X                                                                                                              Bowel Management: [x]Continent  []Incontinent  []Accidents Comments:                                                  Bladder Management: [x]Continent  []Incontinent  []Accidents Comments:  WHEELCHAIR SKILLS: Manual w/c Propulsion: [x]UE or LE strength and endurance sufficient to participate in ADLs using manual wheelchair Arm :  []left []right  [x]Both                                   Foot:   []left []right  [x]Both  Distance: 150 feet on carpet and linoleum, able to cross threshold no difficulty; SPO2 92%, HR 92 following wheelchair mobility;   Operate Scooter: [x] Strength, hand grip, balance and transfer appropriate for use [x]Living environment is accessible for use of scooter  Operate Power w/c:  [x] Std. Joystick   [] Alternative Controls Indep [x] Assist [] Dependent/ Unable [] N/A [] [x]Safe          [] Functional      Distance:                Bed confined without wheelchair [] Yes [x] No   STRENGTH/RANGE OF MOTION:  Range of Motion Strength  Shoulder         WFL                                        Grossly 4+/5  Elbow       WFL                                         Grossly 4+/5  Wrist/Hand                                                       WFL         Grossly 4/5  Hip                                                         WFL     Grossly 3+/5  Knee       WFL                                                     Grossly 4/5  Ankle Decreased due to neuropathy/weakness Grossly 2/5      MOBILITY/BALANCE:  [] Patient is totally dependent for mobility                                                                                               Balance Transfers Ambulation  Sitting Balance: Standing Balance: [x] Independent; uses RW [] Independent/Modified Independent  [x] [facility]     [] [facility] [] Supervision [x] Supervision with RW  [] Uses UE for balance  [x] Supervision [] Min Assist [] Ambulates with Assist                           [] Min Assist [] Min assist [] Mod Assist [] Ambulates with Device:  [] RW   [] StW   [] Kasandra Knudsen   []                [] Mod Assist [] Mod assist [] Max assist   [] Max Assist []  Max assist [] Dependent [] Indep. Short Distance Only  [] Unable [] Unable [] Lift / Sling Required Distance (in feet)          40 feet; gait speed: 0.27 m/s with RW which is limited home ambulator at risk for falls;                    [] Sliding board [] Unable to Ambulate: (Explain:  Cardio Status:  [x]Intact  [] Impaired   [] NA                              Respiratory Status:  [x]Intact   []Impaired   []NA    Following wheelchair mobility, HR: 92 BPM, SPO2 92-94%                                 Orthotics/Prosthetics: Patient does wear bilateral AFOs for ambulation and mobility;                                                                         Comments (Address manual vs power w/c vs scooter):  Patient is able to propel self in manual wheelchair using both arms and legs. She was able to negotiate door thresholds without difficulty and was able to propel self well on linoleum and carpet. Patient does require an extra wide wheelchair which does limit some mobility in the home as her wheelchair will not fit in her bedroom or bathroom. However patient is ambulatory for short distances with RW. Patient would be more functional in her community with a power chair but does not need it for home use.                                             Anterior / Posterior Obliquity Rotation-Pelvis  PELVIS    [x]Neutral  [] Posterior  [] Anterior     [x]WFL  []Right Elevated  []Left Elevated   [x]WFL  []Right Anterior []  Left Anterior    [] Fixed [] Partly Flexible [] Flexible  [] Other  [] Fixed  [] Partly Flexible  [] Flexible [] Other  [] Fixed  [] Partly Flexible  [] Flexible [] Other  TRUNK [x]WFL []Thoracic Kyphosis []Lumbar  Lordosis   [x] [facility] []Convex Right []Convex Left   []c-curve []s-curve []multiple  [] Neutral [] Left-anterior [] Right-anterior    [] Fixed [] Flexible [] Partly Flexible       Other  [] Fixed [] Flexible [] Partly Flexible [] Other  []  Fixed           [] Flexible [] Partly Flexible [] Other   Position Windswept   HIPS  [x] Neutral [] Abduct [] ADduct [x] Neutral [] Right [] Left       [] Fixed  [] Partly Flexible             [] Dislocated [] Flexible [] Subluxed    [] Fixed [] Partly Flexible  [] Flexible [] Other              Foot Positioning Knee Positioning   Knees and  Feet  [x] WFL []Left []Right [x] [facility] []Left []Right   KNEES ROM concerns: ROM concerns:   & Dorsi-Flexed                    []Lt []Rt                                  FEET Plantar Flexed                  []Lt []Rt     Inversion                    []Lt []Rt     Eversion                    []Lt []Rt    HEAD [x] Functional [x] Good Head Control   & [] Flexed         [] Extended [] Adequate Head Control   NECK [] Rotated  Lt  [] Lat Flexed Lt [] Rotated  Rt [] Lat Flexed Rt [] Limited Head Control    [] Cervical Hyperextension [] Absent  Head Control    SHOULDERS ELBOWS WRIST& HAND         Left     Right    Left     Right  U/E [x]Functional  Left            [x]Functional  Right                                 []Fisting             []Fisting     []elevated Left []depressed  Left []elevated Right []depressed  Right      []protracted Left []retracted Left []protracted Right []retracted Right []subluxed  Left              []subluxed  Right         Goals for Wheelchair Mobility  [x] Independence with mobility in the home with motor related ADLs (MRADLs)  [x] Independence with MRADLs in the community [] Provide dependent mobility  [] Provide recline     []Provide tilt   Goals for Seating system [x] Optimize pressure distribution [x] Provide support needed to facilitate function or safety [x] Provide corrective forces to assist with maintaining or improving posture [x] Accommodate client's posture: current seated postures and positions are not flexible or will not  tolerate corrective forces [x] Client to be  independent with relieving pressure in the wheelchair []Enhance physiological function such as breathing, swallowing, digestion  Simulation ideas/Equipment trials:   Pt able to propel self independently in manual wheelchair with BUE and BLE at a good pace without difficulty; able to propel >150 feet without shortness of breath; SPO2 92%, HR 92; Patient does have carpet at home which does make moving wheelchair difficult;    Pt ambulated with RW, 40 feet at slower gait speed with reciprocal gait pattern, supervision for safety; Patient ambulates at slower gait speed 0.27 m/s which puts her at risk for falls;                                                                                            State why other equipment was unsuccessful:                                                                                 [employer] and JUSTIFICATION: [place]  Manufacturer:           Model:              Size: Width           Seat Depth             []provide transport from point A to B []promote Indep mobility  []is not a safe, functional ambulator []walker or cane inadequate []non-standard width/depth necessary to accommodate anatomical measurement []                            []Manual Mobility Base []non-functional ambulator    []Scooter/POV  []can safely operate  []can safely transfer   []has adequate trunk stability  []cannot functionally propel manual w/c  []Power Mobility Base  []non-ambulatory  []cannot functionally propel manual wheelchair  [] cannot functionally and safely operate scooter/POV []can safely operate and willing to  []Stroller Base []infant/child  []unable to propel manual wheelchair []allows for growth []non-functional ambulator []non-functional UE []Indep mobility is not a goal at this time  []Tilt  []Forward                   []Backward                  []Powered tilt              []Manual tilt  []change position  against gravitational force on head and shoulders  []change position for pressure relief/cannot weight shift []transfers  []management of tone []rest periods []control edema []facilitate postural control  []                                      []  Recline  []Power recline on power base []Manual recline on manual base  []accommodate femur to back angle  []bring to full recline for ADL care  []change position for pressure relief/cannot weight shift []rest periods []repositioning for transfers or clothing/diaper /catheter changes []head positioning  []Lighter weight required []self- propulsion  []lifting []                                                []Heavy Duty required []user weight greater than 250# []extreme tone/ over active movement []broken frame on previous chair []                                    [] Back  [] Angle Adjustable [] Custom molded                           []postural control []control of tone/spasticity []accommodation of range of motion []UE functional control []accommodation for seating system []                                         []provide lateral trunk support []accommodate deformity []provide posterior trunk support []provide lumbar/sacral support []support trunk in midline []Pressure relief over spinal processes  [x] Seat Cushion  High Profile Roho                       [x]impaired sensation  [x]decubitus ulcers present [x]history of pressure ulceration []prevent pelvic extension [x]low maintenance  []stabilize pelvis  []accommodate obliquity []accommodate multiple deformity [x]neutralize lower extremity position [x]increase pressure distribution []                                          [] Pelvic/thigh support  [] Lateral thigh guide [] Distal medial pad  [] Distal lateral pad [] pelvis in neutral []accommodate pelvis [] position upper legs [] alignment [] accommodate ROM [] decrease adduction []accommodate tone []removable  for transfers []decrease abduction  [] Lateral trunk Supports [] Lt     [] Rt []decrease lateral trunk leaning []control tone []contour for increased contact []safety  []accommodate asymmetry []                                                [] Mounting hardware  []lateral trunk supports  []back   []seat []headrest      [] thigh support []fixed   []swing away []attach seat platform/cushion to w/c frame []attach back cushion to w/c frame []mount postural supports []mount headrest  []swing medial thigh support away []swing lateral supports away for transfers  []                                                    Armrests  []  fixed []adjustable height []removable   []swing away  []flip back   []reclining []full length pads []desk    []pads tubular  []provide support with elbow at 90   []provide support for w/c tray []change of height/angles for variable activities []remove for transfers []allow to come closer to table top []remove for access to tables []                                              Hangers/ Leg rests  []60 []70 []90 []elevating []heavy duty  []articulating []fixed []lift off []swing away     []power []provide LE support  []accommodate to hamstring tightness []elevate legs during recline   []provide change in position for Legs []Maintain placement of feet on footplate []durability []enable transfers []decrease edema []Accommodate lower leg length []                                        Foot support Footplate    []Lt  [] Rt  [] Center mount []flip up                            []depth/angle adjustable []Amputee adapter    [] Lt     [] Rt []provide foot support []accommodate to ankle ROM []transfers []Provide support for residual extremity [] allow foot to go under wheelchair base [] decrease tone  []                                                [] Ankle strap/heel loops []support foot on foot support []decrease extraneous movement []provide  input to heel  []protect foot  Tires: []pneumatic  []flat free inserts  []solid  []decrease maintenance  []prevent frequent flats []increase shock absorbency []decrease pain from road shock []decrease spasms from road shock []                                             [] Headrest  []provide posterior head support []provide posterior neck support []provide lateral head support []provide anterior head support []support during tilt and recline []improve feeding   []improve respiration []placement of switches []safety  []accommodate ROM  []accommodate tone []improve visual orientation  [] Anterior chest strap [] Vest [] Shoulder retractors  []decrease forward movement of shoulder []accommodation of TLSO []decrease forward movement of trunk []decrease shoulder elevation []added abdominal support []alignment []assistance with shoulder control  []                                              Pelvic Positioner []Belt []SubASIS bar []Dual Pull []stabilize tone []decrease falling out of chair/ **will not Decrease potential for sliding due to pelvic tilting []prevent excessive rotation []pad for protection over boney prominence []prominence comfort []special pull angle to control rotation []  Upper ExtremitySupport  []L   [] R []Arm trough   []hand support [] tray       []full tray []swivel mount []decrease edema      []decrease subluxation   []control tone   []placement for AAC/Computer/EADL []decrease gravitational pull on shoulders []provide midline positioning []provide support to increase UE function []provide hand support in natural position []provide work surface   POWER WHEELCHAIR CONTROLS  []Proportional  []Non-Proportional Type                                      []Left  []Right []provides access for controlling wheelchair   []lacks motor control to operate proportional drive control []unable to understand  proportional controls  Actuator Control Module  []Single  []Multiple   []Allow the client to operate the power seat function(s) through the joystick control   []Safety Reset Switches []Used to change modes and stop the wheelchair when driving in latch mode    []Upgraded Electronics   []programming for accurate control []progressive Disease/changing condition []non-proportional drive control needed []Needed in order to operate power seat functions through joystick control   []Display box []Allows user to see in which mode and drive the wheelchair is set  []necessary for alternate controls    []Digital interface electronics []Allows w/c to operate when using alternative drive controls  []ASL Head Array []Allows client to operate wheelchair  through switches placed in tri-panel headrest  []Sip and puff with tubing kit []needed to operate sip and puff drive controls  []Upgraded tracking electronics []increase safety when driving []correct tracking when on uneven surfaces  []Mount for switches or joystick []Attaches switches to w/c  []Swing away for access or transfers []midline for optimal placement []provides for consistent access  []Attendant controlled joystick plus mount []safety []long distance driving []operation of seat functions []compliance with transportation regulations []                                            Rear wheel placement/Axle adjustability []None []semi adjustable []fully adjustable  []improved UE access to wheels []improved stability []changing angle in space for improvement of postural stability []1-arm drive access []amputee pad placement []                               Wheel rims/ hand rims  []metal   []plastic coated []oblique projections           []vertical projections []Provide ability to propel manual wheelchair  [] Increase self-propulsion with hand weakness/decreased grasp  Push handles []extended   []angle adjustable              []standard  []caregiver access []caregiver assist []allows "hooking" to enable increased ability to perform ADLs or maintain balance  One armed device   []Lt   []Rt []enable propulsion of manual wheelchair with one arm   []                                           Brake/wheel lock extension [] Lt   [] Rt []increase indep in applying wheel locks   []Side guards []prevent  clothing getting caught in wheel or becoming soiled [] prevent skin tears/abrasions  Battery:                                            []to power wheelchair                                                         Other:                                                                                                                        The above equipment has a life- long use expectancy. Growth and changes in medical and/or functional conditions would be the exceptions. This is to certify that the therapist has no financial relationship with durable medical provider or manufacturer. The therapist will not receive remuneration of any kind for the equipment recommended in this evaluation.   Patient has mobility limitation that significantly impairs safe, timely participation in one or more mobility related ADL's. (bathing, toileting, feeding, dressing, grooming, moving from room to room)  [x] Yes [] No  Will mobility device sufficiently improve ability to participate and/or be aided in participation of MRADL's?      [x] Yes [] No  Can limitation be compensated for with use of a cane or walker?                                    [] Yes [x] No  Does patient or caregiver demonstrate ability/potential ability & willingness to safely use the mobility device?    [x] Yes [] No  Does patient's home environment support use of recommended mobility device?            [x] Yes [] No  Does patient have sufficient upper extremity function necessary to functionally propel a manual wheelchair?     [x] Yes [] No  Does patient have sufficient strength  and trunk stability to safely operate a POV (scooter)?                                  [x] Yes [] No  Does patient need additional features/benefits provided by a power wheelchair for MRADL's in the home?        [] Yes [x] No  Does the patient demonstrate the ability to safely use a power wheelchair?                   [x] Yes [] No     Physician's Name Printed:     Dr.  Hemang Shah                                                   Physician's Signature:  Date:     This is to certify that I, the above signed therapist have the following affiliations: [] This DME provider [] Manufacturer of recommended equipment [] Patient's long term care facility [x] None of the above  Therapist Name/Signature:           Norwood Levo. Barnet Glasgow PT, DPT                                 Date: 10/24/17     Objective measurements completed on examination: See above findings.         PT Education - 10/24/17 1012    Education provided Yes   Education Details wheelchair recommendations   Person(s) Educated Patient   Methods Explanation   Comprehension Verbalized understanding             PT Long Term Goals - 10/24/17 1017      PT LONG TERM GOAL #1   Title Pt and caregivers will understand PT recommendation and appropriate/safe use for wheelchair and seating for home use.   Time 1   Period Days   Status Achieved   Target Date 10/24/17                Plan - 10/24/17 1013    Clinical Impression Statement 56 yo Female presents to therapy for wheelchair evaluation to get a power chair for mobiltiy in her home and community. Patient had a significant medical event in April 2017 where she was admitted to hospital with UTI, sepsis/rhabdomyolysis and diabetic episode. Since this event she has had decreased mobility and has been using a manual wheelchair in her home.Patient is able to propel self in manual wheelchair well on various surfaces. She would benefit from a custom cushion to help  with pressure relief for better wound healing. Patient understanding of our recommendations. She plans to start outpatient PT in January 2019.    History and Personal Factors relevant to plan of care: lives alone, no recent falls, uses manual wheelchair in home, independent in transfers; mod I for self care ADLS; has ramps; has hospital bed;    Clinical Presentation Stable   Clinical Presentation due to: symptoms seem to be improving with better mobility;    Clinical Decision Making Low   Rehab Potential Good   Clinical Impairments Affecting Rehab Potential none   PT Frequency One time visit   PT Treatment/Interventions Wheelchair mobility training;Patient/family education   Consulted and Agree with Plan of Care Patient      Patient will benefit from skilled therapeutic intervention in order to improve the following deficits and impairments:  Abnormal gait, Decreased endurance, Obesity, Pain, Difficulty walking, Decreased mobility  Visit Diagnosis: Difficulty in walking, not elsewhere classified - Plan: PT plan of care cert/re-cert     Problem List Patient Active Problem List   Diagnosis Date Noted  . 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 CKD (chronic kidney disease) stage 3, GFR 30-59 ml/min (HCC) 07/04/2017  . Class 3 drug-induced obesity with serious comorbidity and body mass index (BMI) of 45.0 to 49.9 in adult ([place]) 07/04/2017  . Chronic feet  pain (Location of Primary Source of Pain) (Bilateral) (R>L) 04/25/2017  . Diabetic peripheral neuropathy (HCC) (Location of Primary Source of Pain) (Bilateral) (R>L) 04/25/2017  . Chronic lower extremity pain (Location of Secondary source of pain) (Bilateral) (R>L) 04/25/2017  . Chronic pain syndrome 04/25/2017  . Long term (current) use of opiate analgesic 04/25/2017  . Long term prescription opiate use 04/25/2017  . Opiate use 04/25/2017  . Chronic hand pain (Location of Tertiary source of pain) (Bilateral) (L>R) 04/25/2017   . Carpal tunnel syndrome (Bilateral) (L>R) 04/25/2017  . History of stroke 04/25/2017  . Infected decubitus ulcer, unstageable ([place]) 10/26/2016  . Closed fracture of humerus, surgical neck 05/31/2016  . Closed 3-part fracture of surgical neck of right humerus with delayed healing 05/31/2016  . Pain in shoulder 05/19/2016  . Edema leg 05/18/2016  . 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 Kidney lump 05/18/2016  . Bilateral edema of lower extremity 05/18/2016  . Bilateral leg weakness 05/18/2016  . Kidney mass 05/18/2016  . Pressure ulcer 04/24/2016  . History of DKA (diabetic ketoacidosis) ([place]) 04/23/2016    Class: History of  . Diabetes mellitus ([place]) 04/23/2016  . Other specified diabetes mellitus with ketoacidosis without coma ([place]) 04/23/2016    Trotter,Margaret PT, DPT 10/24/2017, 10:19 AM  [place] [facility] SERVICES [place], [place], [address] Phone: [phone]   Fax:  [phone]  Name: [name] MRN: [id number] Date of Birth: September 22, 1961

## 2017-11-01 ENCOUNTER — Encounter: Payer: BLUE CROSS/BLUE SHIELD | Attending: Internal Medicine | Admitting: Internal Medicine

## 2017-11-01 DIAGNOSIS — F411 Generalized anxiety disorder: Secondary | ICD-10-CM | POA: Insufficient documentation

## 2017-11-01 DIAGNOSIS — J449 Chronic obstructive pulmonary disease, unspecified: Secondary | ICD-10-CM | POA: Insufficient documentation

## 2017-11-01 DIAGNOSIS — K219 Gastro-esophageal reflux disease without esophagitis: Secondary | ICD-10-CM | POA: Diagnosis not present

## 2017-11-01 DIAGNOSIS — F17210 Nicotine dependence, cigarettes, uncomplicated: Secondary | ICD-10-CM | POA: Diagnosis not present

## 2017-11-01 DIAGNOSIS — I10 Essential (primary) hypertension: Secondary | ICD-10-CM | POA: Diagnosis not present

## 2017-11-01 DIAGNOSIS — E1142 Type 2 diabetes mellitus with diabetic polyneuropathy: Secondary | ICD-10-CM | POA: Diagnosis not present

## 2017-11-01 DIAGNOSIS — L89314 Pressure ulcer of right buttock, stage 4: Secondary | ICD-10-CM | POA: Insufficient documentation

## 2017-11-05 NOTE — Progress Notes (Signed)
[name], [name] ([id number]) Visit Report for 11/01/2017 Debridement Details Patient Name: [name], [name] Date of Service: 11/01/2017 8:00 AM Medical Record Number: [id number] Patient Account Number: [id number] Date of Birth/Sex: [birth year] (56 y.o. Female) Treating RN: Ashok Cordia, Debi Primary Care Provider: Joen [name] Other Clinician: Referring Provider: Joen [name] Treating Provider/Extender: [facility] [place] in Treatment: 58 Debridement Performed for Wound #3 Right Gluteal fold Assessment: Performed By: Physician Maxwell Caul, MD Debridement: Debridement Pre-procedure Verification/Time Yes - 08:22 Out Taken: Start Time: 08:23 Pain Control: Lidocaine 4% Topical Solution Level: Skin/Subcutaneous Tissue Total Area Debrided (L x W): 0.2 (cm) x 0.9 (cm) = 0.18 (cm) Tissue and other material Viable, Non-Viable, Exudate, Fibrin/Slough, Subcutaneous debrided: Instrument: Curette Bleeding: Minimum Hemostasis Achieved: Pressure End Time: 08:25 Procedural Pain: 0 Post Procedural Pain: 0 Response to Treatment: Procedure was tolerated well Post Debridement Measurements of Total Wound Length: (cm) 0.2 Stage: Category/Stage IV Width: (cm) 0.9 Depth: (cm) 1.8 Volume: (cm) 0.254 Character of Wound/Ulcer Post Requires Further Debridement Debridement: Post Procedure Diagnosis Same as Pre-procedure Electronic Signature(s) Signed: 11/01/2017 5:39:07 PM By: Baltazar Najjar MD Signed: 11/03/2017 4:36:47 PM By: Alejandro Mulling Entered By: Baltazar Najjar on 11/01/2017 08:32:15 [place], [name] Sheller ([id number]) -------------------------------------------------------------------------------- HPI Details Patient Name: [name] Date of Service: 11/01/2017 8:00 AM Medical Record Number: [id number] Patient Account Number: [id number] Date of Birth/Sex: 1961-08-27 (56 y.o. Female) Treating RN: Ashok Cordia, Debi Primary Care Provider: Joen [name] Other Clinician: Referring  Provider: Joen [name] Treating Provider/Extender: [facility] [place] in Treatment: 70 History of Present Illness Location: bilateral gluteal ulcerations and left popliteal ulceration with tunneling Quality: adits to intermittent aching to ischial ulcers, no discomfort to sinus tract Severity: right iscial ulcer with increased depth Duration: chronic ulcers to bilateral ischial ulcers and sinus tract Timing: The pain is intermittent in severity as far as how intense it becomes but is present all the time. Manipulationn makes this worse. Context: The wound occurred when the patient had a fall and was unconscious for about 48 hours laying on the floor and she had pressure injury at that stage. Modifying Factors: Other treatment(s) tried include:as noted below she has been seen by visiting wound care physicians or nurse practitioners and details have been noted Associated Signs and Symptoms: has yet to receive offloading cushions and mattress overlay HPI Description: 56 year old patient who was seen by visiting [name] wound care specialist for a wound on both her buttock and was found to have an unstageable wound on the right buttock for about 2 months. I understand that she had a fall and was laying on the floor for about 48 hours before she was found and taken to the ICU and had a long injury to her gluteal area from pressure and also had broken her right humerus. She has had a right proximal humerus fracture and has been followed up with orthopedics recently. The patient has a past medical history of type 2 diabetes mellitus, paraparesis, acute pyelonephritis, GERD, hypertension, glaucoma, chronic pain, anxiety neurosis, nicotine dependence, COPD. the patient had some debridement done and was to operative was recommended to use Silvadene dressing and offloading. She is a smoker and occasionally smokes a few cigarettes. the patient requested a second opinion for months and is here to  discuss her care. 09/21/16; the patient re-presents from home today for review of 3 different wounds. I note that she was seen in the clinic here in July at which time she had bilateral buttock wounds. It was apparently suggested at  that time that she use a wound VAC bridged to both wounds just near the initial tuberosity's bilaterally which she refused. The history was a bit difficult to put together. Apparently this patient became ill at the end of April of this year. She was found sitting on the floor she had apparently been for 2 days and subsequently admitted to hospital from 04/23/16 through 05/02/16 and at that point she was critically ill ultimately having sepsis secondary to UTI, nontraumatic rhabdomyolysis and diabetic ketoacidosis. She had acute renal failure and I think required ICU care including intubation. Patient states her wounds actually started at that point on the bilateral issue tuberosities however in reviewing the discharge summary from 5/8 I see no reference to wounds at that point. It did state that she had left lower extremity cellulitis however. Reviewing Epic I see no relevant x-rays. It would appear that her discharge creatinine was within the normal range and indeed on 9/15 her creatinine has remained normal. [facility] There the wounds on her bilateral Buttocks were dressed. Only just before her discharge from the nursing facility she developed an "knot" which was interpreted as cellulitis on the posterior aspect of her left knee she was given antibiotics. Apparently sometime late in July a this actually opened and became a wound at home health care was tending to however she is still having purulent drainage coming from this and by my understanding the wound depth is actually become unmeasurable. I am not really clear about what home health has been placing in any of these wound areas. The patient states that is  something with silver and it. She is not been systemically unwell no fever or chills her appetite is good. She is a diabetic poorly controlled however she states that her recent blood sugars at home have been in the low to mid 100s. 09/28/16 On evaluation today patient appears to continue to exhibit the 3 areas of ulceration that were noted previous. She did have an x-ray of the right pelvis which showed evidence of potential soft tissue infection but no obvious osteomyelitis. There was a discussion last office visit concerning the possibility of a wound VAC. Witth that being said the x-ray report suggested that an MRI may be more appropriate to further evaluate the extent. Subsequently in regard to the wound over the popliteal portion of the left lower extremity with tunneling at 12:00 the CT scan that was ordered was denied by insurance as they state Rabalais, [name] ([id number]) the patient has not had x-rays prior to advanced imaging. Patient states that she is frustrated with the situation overall. 10/05/16 in the interval since I last saw this patient last week she has had the x-ray of the knee performed. I did review that x-ray today and fortunately shows no evidence of osteomyelitis or other acute abnormality at this point in time. She continues to have the opening iin the posterior left popliteal space with tracking proximal up the posterior thigh. Nothing seems to have worsened but it also seems to have not improved. The same is true in regard to the right pressure ulcer over the gluteal region which extends toward the ischium. The left gluteal pressure ulcer actually appears to be doing somewhat better my opinion there is some necrotic slough but overall this appears fairly well. She tells me thatt she has some discomfort especially when home health is helping her with dressing changes as they do not know her. At  worse she rates her pain to be a 5 out of 10 right now it's more like a 1 out of  10. 10/07/16; still the patient has 3 different wound areas. She has a deep stage IV wound over her right ischial tuberosity. She is due to have an MRI next week. The wound over her left ischial tuberosity is more superficial and underwent debridement today. Finally she has a small open area in her left popliteal fossa the probes on measurably forward superiorly. Still a lot of drainage coming out of this. The CT scan that I ordered 3 weeks ago was questioned by her insurance company wanting a plain x-ray first. As I understand things result of this is nothing has been done in 3 weeks in terms of imaging the thigh and she has an MRI booked of this along with her pelvis for next week line 10/12/16; patient has a deep probing wound over the right ischiall tuberosity, stage III wound over the left visual tuberosity and a draining sinus in her left popliteal fossa. None of this much different from when I saw this 3 weeks ago. We have been using silver rope to the right ischial wound and a draining area in the left popliteal fossa. Plain silver alginate to the area on the left ischial tuberosity 10/19/16; the patient's wounds are essentially unchanged although the area on the left lower gluteal is actually improved. Our intake nurse noted drainage from the right initial tuberosity probing wound as well as the draining area in the left popliteal fossa. Both of these were cultured. She had x-rays I think at the insistence of her insurance company on 09/23/16 x-ray of the pelvis was not particularly helpful she did have soft tissue air over the right lower pelvis although with the depth of this wound this is not surprising. An x-ray of her left knee did not show any specific abnormalities. We are still using silver alginate to these wound areas. Her MRI is booked for 10/27 10/26/16; cultures of the purulent drainage in her right initial tuberosity wound grew moderate Proteus and few staph aureus. The same  organisms were cultured out of the left knee sinus tract posteriorly. The staph aureus is MRSA. I had started her on Augmentin last week I added doxycycline. The MRI of the left lower extremity and pelvis was finally done. The MRI of the femur showed subcutaneous soft tissue swelling edema fluid and myositis in the vastus lateralis muscle but no soft tissue abscess septic arthritis or osteomyelitis. MRI of the pelvis showed the left wound to be more expensive extending down to the bone there was osteomyelitis. Left hamstring tendons were also involved. No septic arthritis involving the hip. The decubitus ulcer on the right side showed no definite osteomyelitis or abscess.. The right hip wound is actually the one the probes 6 cm downward. But the MRI showing infection including osteomyelitis on the left explains the draining sinus in the popliteal fossa on the left. She did have antibiotics in the hospitalization last time and this extended into her nursing home stay but I'm not exactly sure what antibiotics and for what duration. According the patient this did include vancomycin with considerable effort of our staff we are able to get the patient into see Dr. Sampson Goon today. There were transportation difficulties. Her mother had open heart surgery and is in the ICU in [place] therefore her brother was unable to transport. Dr. Jarrett Ables office graciously arranged time to see her today. From my point  of view she is going to require IV vancomycin plus perhaps a third generation cephalosporin. I plan to keep her on doxycycline and Augmentin until the IV antibiotics can be arranged. 11/02/2016 - [name] presents today for management of ulcers; She saw Dr. 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 She is to have the PICC line placed today and receive the initial dose for both antibiotics today. She has yet  to receive the offloading chair cushion and/or mattress overlay from home health, apparently this has been a 3 week process. I encouraged her to speak to home health regarding this matter, along with offering home health to contact the wouns care center with any questions or concerns. The left ischial pressure ulcer continues to imporve, while to right ischial ulcer has increased in depth. The popliteal fossa sinus tract remains unmeasurable due to the limitation of depth measurement (tract extends beyond our measuring devices). 11-16-2016 [name] presents today for evaluation and management of bilateral ischial stage IV pressure ulcers and sinus tract to the left popliteal fossa. she is under the care of Dr. Sampson Goon for IV antibiotic therapy; she states that the vancomycin was placed on hold and will be restarted at a lower dose based on her renal function. She continues taking Zosyn in addition to the vancomycin. She also states that she has yet to receive offloading cushions from home health, according to her she does not qualify for these offloading cushions because "the ulcers are unstageable ". We will contact the home health agency today to lend clarity regarding her pressure ulcers. The left popliteal fossa sinus tract continues to be a measurable as it extends beyond the length of our measuring devices.. 11/30/16; the patient is still on vancomycin and Zosyn. The depth of the draining sinus behind her left popliteal fossa is down to [name], [name] ([id number]) 4 cm although there is still serosanguineous drainage coming out of this. She saw Dr. Sampson Goon of infectious disease yesterday the idea is to weeks more of IV antibiotics and then oral antibiotics although I have not read his note. The area on the left gluteal fold is just about healed. She has a 6 cm draining sinus over the right initial tuberosity although I cannot feel bone at the base of this. As far as the patient is aware  she has not had a recheck of her inflammatory markers. 12/07/16; patient is on vancomycin and Zosyn appointment with Dr. Sampson Goon on the 19th at which point the patient expects to have a change in antibiotics. Remarkable improvement over the wound over the left ischial tuberosity which is just about closed. The draining sinus in her popliteal fossa has 0.4 cm in depth. The area on the right ischial tuberosity still probes down 7 cm. This is closed and overall wound dimensions but not depth. 12/14/16; patient is completing her vancomycin and Zosyn and per her she is going to transition to Bactrim and Augmentin for another 3 weeks. The area on her left gluteal fold is closed except for some skin tears. The area behind her left knee is no longer has any depth. The only remaining area that is of clinical concern is on the right gluteal fold probing towards the right ischial tuberosity. Today this measures 6.9 cm in depth. Very gritty surface 12/21/16; patient is now on Bactrim and Augmentin as directed by infectious disease. This should be for another 2 weeks. The area in her  left gluteal fold and left popliteal are closed over and fully healed. Measurements today at 7 cm in the right buttock wound is unchanged from last week. 12/27/16; patient is on Bactrim and Augmentin for another week as directed by infectious disease. She has completed her IV antibiotics. She is not been systemically unwell no fever no chills. The area on her right buttock measured over 6 cm in depth. There is no palpable bone. No evidence of surrounding soft tissue infection. She is complaining of tongue irritation and has a history of thrush 01/04/17; patient is been back to see Dr. Sampson Goon, her Augmentin was stopped but he continued the Bactrim for another 3 weeks. Depth of the wound is 6.7 cm there is been no major change in either direction. She is not receive the wound VAC from home health I think because of confusion  about who is supposed to provided will actually talk to the home health agency today [[facility] The net no major change. 01/18/17; patient obtained her wound VAC about 10 days ago however for some reason it was not actually put on the wound. She is therefore here for [place] to apply this I guess. No other issues are noted. She is not complaining of pain fever drainage 02/01/17; patient is here now having the wound VAC or 3-4 weeks to a deep pressure area over the right initial tuberosity. This measures 6 cm in depth today which is about half a centimeter better than 2 weeks ago. There is no evidence she is systemically unwell no fever no chills no pain around the area. 02/15/17; I follow this patient every 2 weeks for a deep area over the right ischial tuberosity. This measures 5.5 cm today which is a continued improvement of 1.2 cm from 1/10 and down 0.5 cm from her visit 2 weeks ago 03/01/17; continued difficult area over the right initial tuberosity using the wound VAC. Depth today of 5.1 cm which is improved. Does not appear to be a lot of drainage in the canister. Antibiotics were finally stopped by Dr. Sampson Goon bactrim[]  and inflammatory markers have been repeated 03/15/17; fall this lady every 2 weeks for a difficult area over her lower right gluteal area/ischial tuberosity. Depth today at 4.6 cm. This is a slow but steady improvement in the depth of this wound. Although we have labeled this as a pressure ulcer there may have been an underlying infection here at one point before we saw her. She had osteomyelitis on the left extensively which is since resolved 03/29/17- patient is here for follow-up evaluation of her right ischial pressure ulcer. She continues with the wound VAC and home health. According to the nurse home health has been using less foams and appropriate and we will instruct accordingly. The patient continues to smoke, approximately 10 cigarettes a day. She has been advised to decrease  that in half by her next appointment, with a goal of complete cessation. She states her blood sugars have been consistently less than 150. She states that she spends most of her day position left lateral or prone. She does have an air mattress on her bed, she does not have an mattress for her chair, she states she cannot afford this. 04/12/17; patient is here for evaluation of her right ischial pressure ulcer. We continue to use a wound VAC with minimal improvement today the depth of this measuring 4.4 cm versus 4.6 cm 2 weeks ago. We are using a [employer] wound VAC on this area. There is not  excessive drainage no pain. The patient tells me she tries to keep off this in bed but is up in the wheelchair for 2 hours a day. She is limited in no her overall ambulation but is improving and apparently is getting bilateral lower extremity braces which she hopes will improve her ability to walk independently. 05/10/17; Depth at 3.3cm. Improved 05/24/17; depth at 3.5 cm. This is not improved since last time. Not clear if they are using collagen under the foam 06/07/17; depth that 2.9 which is a slight improvement. Still using the wound VAC. 06/21/17; depth is 2.9 cm which is exactly the same as last time. Also the appearance of this wound is completely the same. We have been using silver collagen under a wound VAC. 07/05/17 patient presents today for reevaluation concerning her right Ischial wound. She had switched insurance companies and so it does appear that the Wound VAC needs to be reauthorized which we are working on this morning. Nonetheless her wound has been saying about the same we have continued to use the silver collagen underneath the wound VAC. She has no discomfort. 07/19/17; patient follows every 2 weeks for her right if she'll wound. Since I last saw this a month ago her dimensions of come down to 2.6 cm. This is slightly down from a month ago when it was 2.9 cm in 4 months ago it was 4.6 cm. I note  that there [name], [name] ([id number]) were insurance company issues with regard to the wound VAC which she apparently is not having on for several weeks now. I think it would be reasonable to change therapies here. Will use silver alginate rope 08/02/17 on evaluation today patient's wound actually appears to be doing significantly better in regard to the depth as compared to her previous evaluation. She is having no discomfort. Unfortunately she never got the Wound VAC reapproved following the insurance issues from several weeks ago. With that being said it does not appear that she needs to requires the Wound VAC anymore and in fact I feel that she is doing better and making greater improvements at this point without it. Fortunately she has no nausea, vomiting, diarrhea, fevers, or chills. 08/16/17; patient's wound depth down to 2.3 cm. She is using silver alginate packing rope. Note that her wound VAC was discontinued due to insurance issues. This is not particularly surprising. She has not been systemically unwell and has no other new complaints 08/30/17; no change in depth. Still at 2.3 cm. Still with the same gritty surface requiring debridement. I've been using silver alginate for quite a period of time although this came down nicely in the last month 10/04/17; the depth of this is 2 cm however with careful inspection under high-intensity light most of the walls of this small probing sinus seemed to be normally epithelialized. I cannot exactly see the base of this however there is been no drainage. We've been using silver alginate there is no drainage on the dressing when it is removed. 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 There is no evidence of surrounding infection or pain. The patient had many review weakness in her bilateral legs. She is already been to see a neurologist who according the patient told her "you would never walk again" 11/01/17;  this is a patient I last saw a month ago. At that point the linear tunnel in her right buttock was 2 cm. It was not possible to see the depth of this as  skin had grown into the tunnel. In light of the fact that there was no drainage and no visible wound I recommended that we just allow her to go about her usual activity without dressing. She reports that almost immediately she noted drainage on her clothing although in spite of her instructions the contrary she did not come back to the clinic. She has not been specifically addressing this and is continued to no drainage without other symptoms either local or systemic Electronic Signature(s) Signed: 11/01/2017 5:39:07 PM By: Baltazar Najjar MD Entered By: Baltazar Najjar on 11/01/2017 08:34:11 Heater, [name] Sheller ([id number]) -------------------------------------------------------------------------------- Physical Exam Details Patient Name: [name] Date of Service: 11/01/2017 8:00 AM Medical Record Number: [id number] Patient Account Number: [id number] Date of Birth/Sex: 08-Dec-1961 (56 y.o. Female) Treating RN: Ashok Cordia, Debi Primary Care Provider: Joen [name] Other Clinician: Referring Provider: Joen [name] Treating Provider/Extender: [employer] [place] in Treatment: 58 Constitutional Sitting or standing Blood Pressure is within target range for patient.. Pulse regular and within target range for patient.Marland Kitchen Respirations regular, non-labored and within target range.. Temperature is normal and within the target range for the patient.Marland Kitchen appears in no distress. Notes Would exam; the actual wound area is on the right ischial tuberosity. She does indeed still have epithelialization on the walls of the small tunneling area however this is clearly not closed. Using a #3 curet I remove necrotic debris from the surface of the wound there is bleeding therefore clearly not a "healed" wound. Hemostasis with direct pressure Electronic  Signature(s) Signed: 11/01/2017 5:39:07 PM By: Baltazar Najjar MD Entered By: Baltazar Najjar on 11/01/2017 08:36:26 [name], [name] Sheller ([id number]) -------------------------------------------------------------------------------- Physician Orders Details Patient Name: [name] Date of Service: 11/01/2017 8:00 AM Medical Record Number: [id number] Patient Account Number: [id number] Date of Birth/Sex: December 10, 1961 (56 y.o. Female) Treating RN: Ashok Cordia, Debi Primary Care Provider: Joen [name] Other Clinician: Referring Provider: Joen [name] Treating Provider/Extender: [employer] [place] in Treatment: 103 Verbal / Phone Orders: Yes Clinician: Ashok Cordia, Debi Read Back and Verified: Yes Diagnosis Coding Wound Cleansing Wound #3 Right Gluteal fold o Clean wound with Normal Saline. o Cleanse wound with mild soap and water Anesthetic Wound #3 Right Gluteal fold o Topical Lidocaine 4% cream applied to wound bed prior to debridement Skin Barriers/Peri-Wound Care Wound #3 Right Gluteal fold o Skin Prep Primary Wound Dressing Wound #3 Right Gluteal fold o Hydrafera Blue - Classic Antibacterial Foam Tunneling Dressing (moisten before placing in wound) Secondary Dressing Wound #3 Right Gluteal fold o Other - telfa island Dressing Change Frequency Wound #3 Right Gluteal fold o Other: - HHRN to change twice a week Mondays and Thursdays Follow-up Appointments Wound #3 Right Gluteal fold o Other: - Next appt Dec 5 Off-Loading Wound #3 Right Gluteal fold o Turn and reposition every 2 hours Additional Orders / Instructions Wound #3 Right Gluteal fold o Increase protein intake. Home Health Wound #3 Right Gluteal fold o [facility] - ASHTIN, GRISEZ ([id number]) o Home Health Nurse may visit PRN to address patientos wound care needs. o FACE TO FACE ENCOUNTER: MEDICARE and MEDICAID PATIENTS: I certify that this patient is  under my care and that I had a face-to-face encounter that meets the physician face-to-face encounter requirements with this patient on this date. The encounter with the patient was in whole or in part for the following MEDICAL CONDITION: (primary reason for Home Healthcare) MEDICAL NECESSITY: I certify, that based on my findings, NURSING services are a  medically necessary home health service. HOME BOUND STATUS: I certify that my clinical findings support that this patient is homebound (i.e., Due to illness or injury, pt requires aid of supportive devices such as crutches, cane, wheelchairs, walkers, the use of special transportation or the assistance of another person to leave their place of residence. There is a normal inability to leave the home and doing so requires considerable and taxing effort. Other absences are for medical reasons / religious services and are infrequent or of short duration when for other reasons). o If current dressing causes regression in wound condition, may D/C ordered dressing product/s and apply Normal Saline Moist Dressing daily until next Wound Healing Center / Other MD appointment. Notify Wound Healing Center of regression in wound condition at [phone]. o Please direct any NON-WOUND related issues/requests for orders to patient's Primary Care Physician Electronic Signature(s) Signed: 11/01/2017 5:39:07 PM By: Baltazar Najjar MD Signed: 11/03/2017 4:36:47 PM By: Alejandro Mulling Entered By: Alejandro Mulling on 11/01/2017 08:42:58 [name], [name] ([id number]) -------------------------------------------------------------------------------- Problem List Details Patient Name: [name] Date of Service: 11/01/2017 8:00 AM Medical Record Number: [id number] Patient Account Number: [id number] Date of Birth/Sex: 09-Sep-1961 (56 y.o. 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 09/21/2016 09/21/2016 Resolved Problems ICD-10 Code Description Active Date Resolved Date L89.323 Pressure ulcer of left buttock, stage 3 09/21/2016 09/21/2016 L89.324 Pressure ulcer of left buttock, stage 4 11/16/2016 11/16/2016 Electronic Signature(s) Signed: 11/01/2017 5:39:07 PM By: Baltazar Najjar MD Entered By: Baltazar Najjar on 11/01/2017 08:31:45 Wacker, [name] Sheller ([id number]) -------------------------------------------------------------------------------- Progress Note Details Patient Name: [name] Date of Service: 11/01/2017 8:00 AM Medical Record Number: [id number] Patient Account Number: [id number] Date of Birth/Sex: 06-Nov-1961 (56 y.o. Female) Treating RN: Ashok Cordia, Debi Primary Care Provider: Joen [name] Other Clinician: Referring Provider: Joen [name] Treating Provider/Extender: Altamese Lewes in Treatment: 58 Subjective History of Present Illness (HPI) The following HPI elements were documented for the patient's wound: Location: bilateral gluteal ulcerations and left popliteal ulceration with tunneling Quality: adits to intermittent aching to ischial ulcers, no discomfort to sinus tract Severity: right iscial ulcer with increased depth Duration: chronic ulcers to bilateral ischial ulcers and sinus tract Timing: The pain is intermittent in severity as far as how intense it becomes but is present all the time. Manipulationn makes this worse. Context: The wound  occurred when the patient had a fall and was unconscious for about 48 hours laying on the floor and she had pressure injury at that stage. Modifying Factors: Other treatment(s) tried include:as noted below she has been seen by visiting wound care physicians or nurse practitioners and details have been noted Associated Signs and Symptoms: has yet to receive offloading cushions and mattress overlay 56 year old patient who was seen by visiting [name] wound care specialist for a wound on both her buttock and was found to have an unstageable wound on the right buttock for about 2 months. I understand that she had a fall and was laying on the floor for about 48 hours before she was found and taken to the ICU  and had a long injury to her gluteal area from pressure and also had broken her right humerus. She has had a right proximal humerus fracture and has been followed up with orthopedics recently. The patient has a past medical history of type 2 diabetes mellitus, paraparesis, acute pyelonephritis, GERD, hypertension, glaucoma, chronic pain, anxiety neurosis, nicotine dependence, COPD. the patient had some debridement done and was to operative was recommended to use Silvadene dressing and offloading. She is a smoker and occasionally smokes a few cigarettes. the patient requested a second opinion for months and is here to discuss her care. 09/21/16; the patient re-presents from home today for review of 3 different wounds. I note that she was seen in the clinic here in July at which time she had bilateral buttock wounds. It was apparently suggested at that time that she use a wound VAC bridged to both wounds just near the initial tuberosity's bilaterally which she refused. The history was a bit difficult to put together. Apparently this patient became ill at the end of April of this year. She was found sitting on the floor she had apparently been for 2 days and subsequently admitted to hospital from 04/23/16  through 05/02/16 and at that point she was critically ill ultimately having sepsis secondary to UTI, nontraumatic rhabdomyolysis and diabetic ketoacidosis. She had acute renal failure and I think required ICU care including intubation. Patient states her wounds actually started at that point on the bilateral issue tuberosities however in reviewing the discharge summary from 5/8 I see no reference to wounds at that point. It did state that she had left lower extremity cellulitis however. Reviewing Epic I see no relevant x-rays. It would appear that her discharge creatinine was within the normal range and indeed on 9/15 her creatinine has remained normal. [facility] There the wounds on her bilateral Buttocks were dressed. Only just before her discharge from the nursing facility she developed an "knot" which was interpreted as cellulitis on the posterior aspect of her left knee she was given antibiotics. Apparently sometime late in July a this actually opened and became a wound at home health care was tending to however she is still having purulent drainage coming from this and by my understanding the wound depth is actually become unmeasurable. I am not really clear about what home health has been placing in any of these wound areas. The patient states that is something with silver and it. She is not been systemically unwell no fever or chills her appetite is good. She is a diabetic poorly controlled however she states that her recent blood sugars at home have been in the low to mid 100s. 09/28/16 On evaluation today patient appears to continue to exhibit the 3 areas of ulceration that were noted previous. She did have an x-ray of the right pelvis which showed evidence of potential soft tissue infection but no obvious osteomyelitis. There Pabst, Malley L. ([id number]) was a discussion last office visit concerning the possibility of a wound VAC.  Witth that being said the x-ray report suggested that an MRI may be more appropriate to further evaluate the extent. Subsequently in regard to the wound over the popliteal portion of the left lower extremity with tunneling at 12:00 the CT scan that was ordered was denied by insurance as they state the patient has not had x-rays prior to advanced imaging. Patient states that she is frustrated with the situation overall. 10/05/16 in the interval  since I last saw this patient last week she has had the x-ray of the knee performed. I did review that x-ray today and fortunately shows no evidence of osteomyelitis or other acute abnormality at this point in time. She continues to have the opening iin the posterior left popliteal space with tracking proximal up the posterior thigh. Nothing seems to have worsened but it also seems to have not improved. The same is true in regard to the right pressure ulcer over the gluteal region which extends toward the ischium. The left gluteal pressure ulcer actually appears to be doing somewhat better my opinion there is some necrotic slough but overall this appears fairly well. She tells me thatt she has some discomfort especially when home health is helping her with dressing changes as they do not know her. At worse she rates her pain to be a 5 out of 10 right now it's more like a 1 out of 10. 10/07/16; still the patient has 3 different wound areas. She has a deep stage IV wound over her right ischial tuberosity. She is due to have an MRI next week. The wound over her left ischial tuberosity is more superficial and underwent debridement today. Finally she has a small open area in her left popliteal fossa the probes on measurably forward superiorly. Still a lot of drainage coming out of this. The CT scan that I ordered 3 weeks ago was questioned by her insurance company wanting a plain x-ray first. As I understand things result of this is nothing has been done in 3 weeks  in terms of imaging the thigh and she has an MRI booked of this along with her pelvis for next week line 10/12/16; patient has a deep probing wound over the right ischiall tuberosity, stage III wound over the left visual tuberosity and a draining sinus in her left popliteal fossa. None of this much different from when I saw this 3 weeks ago. We have been using silver rope to the right ischial wound and a draining area in the left popliteal fossa. Plain silver alginate to the area on the left ischial tuberosity 10/19/16; the patient's wounds are essentially unchanged although the area on the left lower gluteal is actually improved. Our intake nurse noted drainage from the right initial tuberosity probing wound as well as the draining area in the left popliteal fossa. Both of these were cultured. She had x-rays I think at the insistence of her insurance company on 09/23/16 x-ray of the pelvis was not particularly helpful she did have soft tissue air over the right lower pelvis although with the depth of this wound this is not surprising. An x-ray of her left knee did not show any specific abnormalities. We are still using silver alginate to these wound areas. Her MRI is booked for 10/27 10/26/16; cultures of the purulent drainage in her right initial tuberosity wound grew moderate Proteus and few staph aureus. The same organisms were cultured out of the left knee sinus tract posteriorly. The staph aureus is MRSA. I had started her on Augmentin last week I added doxycycline. The MRI of the left lower extremity and pelvis was finally done. The MRI of the femur showed subcutaneous soft tissue swelling edema fluid and myositis in the vastus lateralis muscle but no soft tissue abscess septic arthritis or osteomyelitis. MRI of the pelvis showed the left wound to be more expensive extending down to the bone there was osteomyelitis. Left hamstring tendons were also involved. No septic arthritis  involving the  hip. The decubitus ulcer on the right side showed no definite osteomyelitis or abscess.. The right hip wound is actually the one the probes 6 cm downward. But the MRI showing infection including osteomyelitis on the left explains the draining sinus in the popliteal fossa on the left. She did have antibiotics in the hospitalization last time and this extended into her nursing home stay but I'm not exactly sure what antibiotics and for what duration. According the patient this did include vancomycin with considerable effort of our staff we are able to get the patient into see Dr. Sampson Goon today. There were transportation difficulties. Her mother had open heart surgery and is in the ICU in [place] therefore her brother was unable to transport. Dr. Jarrett Ables office graciously arranged time to see her today. From my point of view she is going to require IV vancomycin plus perhaps a third generation cephalosporin. I plan to keep her on doxycycline and Augmentin until the IV antibiotics can be arranged. 11/02/2016 - [name] presents today for management of ulcers; She saw Dr. 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 She is to have the PICC line placed today and receive the initial dose for both antibiotics today. She has yet to receive the offloading chair cushion and/or mattress overlay from home health, apparently this has been a 3 week process. I encouraged her to speak to home health regarding this matter, along with offering home health to contact the wouns care center with any questions or concerns. The left ischial pressure ulcer continues to imporve, while to right ischial ulcer has increased in depth. The popliteal fossa sinus tract remains unmeasurable due to the limitation of depth measurement (tract extends beyond our measuring devices). 11-16-2016 [name] presents today for evaluation and  management of bilateral ischial stage IV pressure ulcers and sinus tract to the left popliteal fossa. she is under the care of Dr. Sampson Goon for IV antibiotic therapy; she states that the vancomycin was placed on hold and will be restarted at a lower dose based on her renal function. She continues taking Zosyn in addition to the vancomycin. She also states that she has yet to receive offloading cushions from home health, according to her she does not qualify for these offloading cushions because "the ulcers are unstageable ". We will contact the home health agency [name], [name]. ([id number]) today to lend clarity regarding her pressure ulcers. The left popliteal fossa sinus tract continues to be a measurable as it extends beyond the length of our measuring devices.. 11/30/16; the patient is still on vancomycin and Zosyn. The depth of the draining sinus behind her left popliteal fossa is down to 4 cm although there is still serosanguineous drainage coming out of this. She saw Dr. Sampson Goon of infectious disease yesterday the idea is to weeks more of IV antibiotics and then oral antibiotics although I have not read his note. The area on the left gluteal fold is just about healed. She has a 6 cm draining sinus over the right initial tuberosity although I cannot feel bone at the base of this. As far as the patient is aware she has not had a recheck of her inflammatory markers. 12/07/16; patient is on vancomycin and Zosyn appointment with Dr. Sampson Goon on the 19th at which point the patient expects to have a change in antibiotics. Remarkable improvement over the wound over the left ischial tuberosity which is just about  closed. The draining sinus in her popliteal fossa has 0.4 cm in depth. The area on the right ischial tuberosity still probes down 7 cm. This is closed and overall wound dimensions but not depth. 12/14/16; patient is completing her vancomycin and Zosyn and per her she is going to  transition to Bactrim and Augmentin for another 3 weeks. The area on her left gluteal fold is closed except for some skin tears. The area behind her left knee is no longer has any depth. The only remaining area that is of clinical concern is on the right gluteal fold probing towards the right ischial tuberosity. Today this measures 6.9 cm in depth. Very gritty surface 12/21/16; patient is now on Bactrim and Augmentin as directed by infectious disease. This should be for another 2 weeks. The area in her left gluteal fold and left popliteal are closed over and fully healed. Measurements today at 7 cm in the right buttock wound is unchanged from last week. 12/27/16; patient is on Bactrim and Augmentin for another week as directed by infectious disease. She has completed her IV antibiotics. She is not been systemically unwell no fever no chills. The area on her right buttock measured over 6 cm in depth. There is no palpable bone. No evidence of surrounding soft tissue infection. She is complaining of tongue irritation and has a history of thrush 01/04/17; patient is been back to see Dr. Sampson Goon, her Augmentin was stopped but he continued the Bactrim for another 3 weeks. Depth of the wound is 6.7 cm there is been no major change in either direction. She is not receive the wound VAC from home health I think because of confusion about who is supposed to provided will actually talk to the home health agency today [[facility] The net no major change. 01/18/17; patient obtained her wound VAC about 10 days ago however for some reason it was not actually put on the wound. She is therefore here for [place] to apply this I guess. No other issues are noted. She is not complaining of pain fever drainage 02/01/17; patient is here now having the wound VAC or 3-4 weeks to a deep pressure area over the right initial tuberosity. This measures 6 cm in depth today which is about half a centimeter better than 2 weeks ago. There is  no evidence she is systemically unwell no fever no chills no pain around the area. 02/15/17; I follow this patient every 2 weeks for a deep area over the right ischial tuberosity. This measures 5.5 cm today which is a continued improvement of 1.2 cm from 1/10 and down 0.5 cm from her visit 2 weeks ago 03/01/17; continued difficult area over the right initial tuberosity using the wound VAC. Depth today of 5.1 cm which is improved. Does not appear to be a lot of drainage in the canister. Antibiotics were finally stopped by Dr. Sampson Goon bactrim[]  and inflammatory markers have been repeated 03/15/17; fall this lady every 2 weeks for a difficult area over her lower right gluteal area/ischial tuberosity. Depth today at 4.6 cm. This is a slow but steady improvement in the depth of this wound. Although we have labeled this as a pressure ulcer there may have been an underlying infection here at one point before we saw her. She had osteomyelitis on the left extensively which is since resolved 03/29/17- patient is here for follow-up evaluation of her right ischial pressure ulcer. She continues with the wound VAC and home health. According to the nurse home  health has been using less foams and appropriate and we will instruct accordingly. The patient continues to smoke, approximately 10 cigarettes a day. She has been advised to decrease that in half by her next appointment, with a goal of complete cessation. She states her blood sugars have been consistently less than 150. She states that she spends most of her day position left lateral or prone. She does have an air mattress on her bed, she does not have an mattress for her chair, she states she cannot afford this. 04/12/17; patient is here for evaluation of her right ischial pressure ulcer. We continue to use a wound VAC with minimal improvement today the depth of this measuring 4.4 cm versus 4.6 cm 2 weeks ago. We are using a [employer] wound VAC on this area. There  is not excessive drainage no pain. The patient tells me she tries to keep off this in bed but is up in the wheelchair for 2 hours a day. She is limited in no her overall ambulation but is improving and apparently is getting bilateral lower extremity braces which she hopes will improve her ability to walk independently. 05/10/17; Depth at 3.3cm. Improved 05/24/17; depth at 3.5 cm. This is not improved since last time. Not clear if they are using collagen under the foam 06/07/17; depth that 2.9 which is a slight improvement. Still using the wound VAC. 06/21/17; depth is 2.9 cm which is exactly the same as last time. Also the appearance of this wound is completely the same. We have been using silver collagen under a wound VAC. 07/05/17 patient presents today for reevaluation concerning her right Ischial wound. She had switched insurance companies and so it does appear that the Wound VAC needs to be reauthorized which we are working on this morning. Nonetheless her wound has been saying about the same we have continued to use the silver collagen underneath the wound VAC. She has [name], [name] ([id number]) no discomfort. 07/19/17; patient follows every 2 weeks for her right if she'll wound. Since I last saw this a month ago her dimensions of come down to 2.6 cm. This is slightly down from a month ago when it was 2.9 cm in 4 months ago it was 4.6 cm. I note that there were insurance company issues with regard to the wound VAC which she apparently is not having on for several weeks now. I think it would be reasonable to change therapies here. Will use silver alginate rope 08/02/17 on evaluation today patient's wound actually appears to be doing significantly better in regard to the depth as compared to her previous evaluation. She is having no discomfort. Unfortunately she never got the Wound VAC reapproved following the insurance issues from several weeks ago. With that being said it does not appear that she  needs to requires the Wound VAC anymore and in fact I feel that she is doing better and making greater improvements at this point without it. Fortunately she has no nausea, vomiting, diarrhea, fevers, or chills. 08/16/17; patient's wound depth down to 2.3 cm. She is using silver alginate packing rope. Note that her wound VAC was discontinued due to insurance issues. This is not particularly surprising. She has not been systemically unwell and has no other new complaints 08/30/17; no change in depth. Still at 2.3 cm. Still with the same gritty surface requiring debridement. I've been using silver alginate for quite a period of time although this came down nicely in the last month 10/04/17; the depth  of this is 2 cm however with careful inspection under high-intensity light most of the walls of this small probing sinus seemed to be normally epithelialized. I cannot exactly see the base of this however there is been no drainage. We've been using silver alginate there is no drainage on the dressing when it is removed. 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 There is no evidence of surrounding infection or pain. The patient had many review weakness in her bilateral legs. She is already been to see a neurologist who according the patient told her "you would never walk again" 11/01/17; this is a patient I last saw a month ago. At that point the linear tunnel in her right buttock was 2 cm. It was not possible to see the depth of this as skin had grown into the tunnel. In light of the fact that there was no drainage and no visible wound I recommended that we just allow her to go about her usual activity without dressing. She reports that almost immediately she noted drainage on her clothing although in spite of her instructions the contrary she did not come back to the clinic. She has not been specifically addressing this and is continued to no drainage without other  symptoms either local or systemic Objective Constitutional Sitting or standing Blood Pressure is within target range for patient.. Pulse regular and within target range for patient.Marland Kitchen Respirations regular, non-labored and within target range.. Temperature is normal and within the target range for the patient.Marland Kitchen appears in no distress. Vitals Time Taken: 8:08 AM, Height: 63 in, Weight: 257 lbs, BMI: 45.5, Temperature: 98.4 F, Pulse: 94 bpm, Respiratory Rate: 18 breaths/min, Blood Pressure: 127/57 mmHg. General Notes: Would exam; the actual wound area is on the right ischial tuberosity. She does indeed still have epithelialization on the walls of the small tunneling area however this is clearly not closed. Using a #3 curet I remove necrotic debris from the surface of the wound there is bleeding therefore clearly not a "healed" wound. Hemostasis with direct pressure Integumentary (Hair, Skin) Wound #3 status is Open. Original cause of wound was Pressure Injury. The wound is located on the Right Gluteal fold. The wound measures 0.2cm length x 0.9cm width x 1.8cm depth; 0.141cm^2 area and 0.254cm^3 volume. There is Fat Layer (Subcutaneous Tissue) Exposed exposed. There is no tunneling or undermining noted. There is a large amount of serous drainage noted. The wound margin is distinct with the outline attached to the wound base. There is medium (34-66%) red [name], [name] ([id number]) granulation within the wound bed. There is a medium (34-66%) amount of necrotic tissue within the wound bed including Adherent Slough. The periwound skin appearance exhibited: Scarring, Dry/Scaly. The periwound skin appearance did not exhibit: Callus, Crepitus, Excoriation, Induration, Rash, Maceration, Atrophie Blanche, Cyanosis, Ecchymosis, Hemosiderin Staining, Mottled, Pallor, Rubor, Erythema. Periwound temperature was noted as No Abnormality. The periwound has tenderness on palpation. 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 There was a Skin/Subcutaneous Tissue Debridement ([id number]) debridement with total area of 0.18 sq cm performed by Maxwell Caul, MD. with the following instrument(s): Curette to remove Viable and Non-Viable tissue/material including Exudate, Fibrin/Slough, and Subcutaneous after achieving pain control using Lidocaine 4% Topical Solution. A time out was conducted at [employer], prior  to the start of the procedure. A Minimum amount of bleeding was controlled with Pressure. The procedure was tolerated well with a pain level of 0 throughout and a pain level of 0 following the procedure. Post Debridement Measurements: 0.2cm length x 0.9cm width x 1.8cm depth; 0.254cm^3 volume. Post debridement Stage noted as Category/Stage IV. Character of Wound/Ulcer Post Debridement requires further debridement. Post procedure Diagnosis Wound #3: Same as Pre-Procedure Plan Wound Cleansing: Wound #3 Right Gluteal fold: Clean wound with Normal Saline. Cleanse wound with mild soap and water Anesthetic: Wound #3 Right Gluteal fold: Topical Lidocaine 4% cream applied to wound bed prior to debridement Skin Barriers/Peri-Wound Care: Wound #3 Right Gluteal fold: Skin Prep Primary Wound Dressing: Wound #3 Right Gluteal fold: Hydrafera Blue - Classic Antibacterial Foam Tunneling Dressing (moisten before placing in wound) Secondary Dressing: Wound #3 Right Gluteal fold: Other - telfa [place] Desrosier, Naiara L. ([id number]) Dressing Change Frequency: Wound #3 Right Gluteal fold: Other: - HHRN to change twice a week Mondays and Thursdays Follow-up Appointments: Wound #3 Right Gluteal fold: Return Appointment in 2 weeks. Off-Loading: Wound #3 Right Gluteal fold: Turn and  reposition every 2 hours Additional Orders / Instructions: Wound #3 Right Gluteal fold: Increase protein intake. Home Health: Wound #3 Right Gluteal fold: [facility] may visit PRN to address patient s wound care needs. FACE TO FACE ENCOUNTER: MEDICARE and MEDICAID PATIENTS: I certify that this patient is under my care and that I had a face-to-face encounter that meets the physician face-to-face encounter requirements with this patient on this date. The encounter with the patient was in whole or in part for the following MEDICAL CONDITION: (primary reason for Home Healthcare) MEDICAL NECESSITY: I certify, that based on my findings, NURSING services are a medically necessary home health service. HOME BOUND STATUS: I certify that my clinical findings support that this patient is homebound (i.e., Due to illness or injury, pt requires aid of supportive devices such as crutches, cane, wheelchairs, walkers, the use of special transportation or the assistance of another person to leave their place of residence. There is a normal inability to leave the home and doing so requires considerable and taxing effort. Other absences are for medical reasons / religious services and are infrequent or of short duration when for other reasons). If current dressing causes regression in wound condition, may D/C ordered dressing product/s and apply Normal Saline Moist Dressing daily until next Wound Healing Center / Other MD appointment. Notify Wound Healing Center of regression in wound condition at [phone]. Please direct any NON-WOUND related issues/requests for orders to patient's Primary Care Physician o #1 this is clearly not a healed state although skin is clearly grown into this area along the walls of the tunneling wound. Overall depth is 1.8 cm. It is impossible to see the base of this wound. #2 I'm going to use Hydrofera Blue rope covered with  a border foam #3 given a list of options including keeping the wound covered for a week and coming back to see us weekly, the patient wants to use home health. I would like to see this area again in 2 weeks Electronic Signature(s) Signed: 11/01/2017 5:39:07 PM By: Baltazar Najjarobson, Russell Quinney MD Entered By: Baltazar Najjarobson, Acasia Skilton on 11/01/2017 08:38:52 Liburd, [name] ShellerAMMY L. ([id number]) -------------------------------------------------------------------------------- SuperBill Details Patient Name: Katelyn GeorgesWARD, Jerzy L. Date of Service: 11/01/2017 Medical Record Number: [id number] Patient Account Number: [id number] Date of Birth/Sex: [birth year] (56 y.o. Female) Treating RN: Ashok CordiaPinkerton, Debi Primary  Care Provider: Joen [name] Other Clinician: Referring Provider: Joen [name] Treating Provider/Extender: Altamese [place] in Treatment: 58 Diagnosis Coding ICD-10 Codes Code Description L89.314 Pressure ulcer of right buttock, stage 4 E11.42 Type 2 diabetes mellitus with diabetic polyneuropathy Facility Procedures CPT4 Code: [id number] Description: 11042 - DEB SUBQ TISSUE 20 SQ CM/< ICD-10 Diagnosis Description L89.314 Pressure ulcer of right buttock, stage 4 Modifier: Quantity: 1 Physician Procedures CPT4 Code: [id number] Description: 11042 - WC PHYS SUBQ TISS 20 SQ CM ICD-10 Diagnosis Description L89.314 Pressure ulcer of right buttock, stage 4 Modifier: Quantity: 1 Electronic Signature(s) Signed: 11/01/2017 5:39:07 PM By: Baltazar Najjar MD Entered By: Baltazar Najjar on 11/01/2017 08:39:27

## 2017-11-05 NOTE — Progress Notes (Signed)
AQUILLA, SHAMBLEY ([id number]) Visit Report for 11/01/2017 Arrival Information Details Patient Name: [name], [name] Date of Service: 11/01/2017 8:00 AM Medical Record Number: [id number] Patient Account Number: [id number] Date of Birth/Sex: 1961-01-04 (56 y.o. Female) Treating RN: Carolyne Fiscal, Debi Primary Care Gerome Kokesh: Lavera Guise Other Clinician: Referring Sreenidhi Ganson: Lavera Guise Treating Antonella Upson/Extender: Tito Dine in Treatment: 56 Visit Information History Since Last Visit All ordered tests and consults were completed: No Patient Arrived: Wheel Chair Added or deleted any medications: No Arrival Time: 08:05 Any new allergies or adverse reactions: No Accompanied By: self Had a fall or experienced change in No Transfer Assistance: EasyP[employer] Patient activities of daily living that may affect Lift risk of falls: Patient Identification Verified: Yes Signs or symptoms of abuse/neglect since last visito No Secondary Verification Process Yes Hospitalized since last visit: No Completed: Pain Present Now: No Patient Requires Transmission-Based No Precautions: Patient Has Alerts: Yes Patient Alerts: DM II Electronic Signature(s) Signed: 11/03/2017 4:36:47 PM By: Alric Quan Entered By: Alric Quan on 11/01/2017 08:05:54 Abt, Breyon Carlean Jews ([id number]) -------------------------------------------------------------------------------- Encounter Discharge Information Details Patient Name: [name] Date of Service: 11/01/2017 8:00 AM Medical Record Number: [id number] Patient Account Number: [id number] Date of Birth/Sex: 1961/06/07 (56 y.o. Female) Treating RN: Carolyne Fiscal, Debi Primary Care Aletha Allebach: Lavera Guise Other Clinician: Referring Marti Mclane: Lavera Guise Treating Shironda Kain/Extender: Tito Dine in Treatment: 38 Encounter Discharge Information Items Discharge Pain Level: 0 Discharge Condition: Stable Ambulatory Status: Wheelchair Discharge  Destination: Home Transportation: Private Auto Accompanied By: self Schedule Follow-up Appointment: Yes Medication Reconciliation completed and No provided to Patient/Care Mariany Mackintosh: Patient Clinical Summary of Care: Declined Electronic Signature(s) Signed: 11/01/2017 9:14:39 AM By: Alric Quan Entered By: Alric Quan on 11/01/2017 09:14:39 [name], [name] ([id number]) -------------------------------------------------------------------------------- Lower Extremity Assessment Details Patient Name: [name] Date of Service: 11/01/2017 8:00 AM Medical Record Number: [id number] Patient Account Number: [id number] Date of Birth/Sex: 1961-08-16 (56 y.o. Female) Treating RN: Ahmed Prima Primary Care Jahnya Trindade: Lavera Guise Other Clinician: Referring Seaton Hofmann: Lavera Guise Treating Mckayla Mulcahey/Extender: Ricard Dillon Weeks in Treatment: 24 Electronic Signature(s) Signed: 11/03/2017 4:36:47 PM By: Alric Quan Entered By: Alric Quan on 11/01/2017 08:17:41 [name], [name] ([id number]) -------------------------------------------------------------------------------- Multi Wound Chart Details Patient Name: [name] Date of Service: 11/01/2017 8:00 AM Medical Record Number: [id number] Patient Account Number: [id number] Date of Birth/Sex: [birth year] (56 y.o. Female) Treating RN: Carolyne Fiscal, Debi Primary Care Tavis Kring: Lavera Guise Other Clinician: Referring Ubaldo Daywalt: Lavera Guise Treating Cheskel Silverio/Extender: Tito Dine in Treatment: 72 Vital Signs Height(in): 70 Pulse(bpm): 94 Weight(lbs): 257 Blood Pressure(mmHg): 127/57 Body Mass Index(BMI): 46 Temperature(F): 98.4 Respiratory Rate 18 (breaths/min): Photos: [3:No Photos] [N/A:N/A] Wound Location: [3:Right Gluteal fold] [N/A:N/A] Wounding Event: [3:Pressure Injury] [N/A:N/A] Primary Etiology: [3:Pressure Ulcer] [N/A:N/A] Comorbid History: [3:Asthma, Hypertension, Type II Diabetes, Neuropathy]  [N/A:N/A] Date Acquired: [3:04/21/2016] [N/A:N/A] Weeks of Treatment: [3:58] [N/A:N/A] Wound Status: [3:Open] [N/A:N/A] Measurements L x W x D [3:0.2x0.9x1.8] [N/A:N/A] (cm) Area (cm) : [3:0.141] [N/A:N/A] Volume (cm) : [3:0.254] [N/A:N/A] % Reduction in Area: [3:95.40%] [N/A:N/A] % Reduction in Volume: [3:98.50%] [N/A:N/A] Classification: [3:Category/Stage IV] [N/A:N/A] Exudate Amount: [3:Large] [N/A:N/A] Exudate Type: [3:Serous] [N/A:N/A] Exudate Color: [3:amber] [N/A:N/A] Wound Margin: [3:Distinct, outline attached] [N/A:N/A] Granulation Amount: [3:Medium (34-66%)] [N/A:N/A] Granulation Quality: [3:Red] [N/A:N/A] Necrotic Amount: [3:Medium (34-66%)] [N/A:N/A] Exposed Structures: [3:Fat Layer (Subcutaneous Tissue) Exposed: Yes] [N/A:N/A] Epithelialization: [3:Small (1-33%)] [N/A:N/A] Debridement: [3:Debridement ([id number])] [N/A:N/A] Pre-procedure [3:08:22] [N/A:N/A] Verification/Time Out Taken: Pain Control: [3:Lidocaine 4% Topical Solution] [N/A:N/A] Tissue Debrided: [3:Fibrin/Slough, Exudates, Subcutaneous] [N/A:N/A] Level: [3:Skin/Subcutaneous Tissue] [N/A:N/A]  Debridement Area (sq cm): 0.18 [N/A:N/A] Instrument: [3:Curette] [N/A:N/A] Bleeding: [3:Minimum] [N/A:N/A] Hemostasis Achieved: [3:Pressure] [N/A:N/A] Procedural Pain: 0 N/A N/A Post Procedural Pain: 0 N/A N/A Debridement Treatment Procedure was tolerated well N/A N/A Response: Post Debridement 0.2x0.9x1.8 N/A N/A Measurements L x W x D (cm) Post Debridement Volume: 0.254 N/A N/A (cm) Post Debridement Stage: Category/Stage IV N/A N/A Periwound Skin Texture: Scarring: Yes N/A N/A Excoriation: No Induration: No Callus: No Crepitus: No Rash: No Periwound Skin Moisture: Dry/Scaly: Yes N/A N/A Maceration: No Periwound Skin Color: Atrophie Blanche: No N/A N/A Cyanosis: No Ecchymosis: No Erythema: No Hemosiderin Staining: No Mottled: No Pallor: No Rubor: No Temperature: No Abnormality N/A  N/A Tenderness on Palpation: Yes N/A N/A Wound Preparation: Ulcer Cleansing: N/A N/A Rinsed/Irrigated with Saline Topical Anesthetic Applied: Other: lidocaine 4% Procedures Performed: Debridement N/A N/A Treatment Notes Electronic Signature(s) Signed: 11/01/2017 5:39:07 PM By: Linton Ham MD Entered By: Linton Ham on 11/01/2017 08:32:03 [name], [name] ([id number]) -------------------------------------------------------------------------------- Multi-Disciplinary Care Plan Details Patient Name: [name] Date of Service: 11/01/2017 8:00 AM Medical Record Number: [id number] Patient Account Number: [id number] Date of Birth/Sex: [birth year] (56 y.o. Female) Treating RN: Carolyne Fiscal, Debi Primary Care Aki Burdin: Lavera Guise Other Clinician: Referring Zakarie Sturdivant: Lavera Guise Treating Sheran Newstrom/Extender: Tito Dine in Treatment: 12 Active Inactive ` Abuse / Safety / Falls / Self Care Management Nursing Diagnoses: Potential for falls Goals: Patient will remain injury free Date Initiated: 09/21/2016 Target Resolution Date: 03/25/2017 Goal Status: Active Interventions: Assess fall risk on admission and as needed Notes: ` Nutrition Nursing Diagnoses: Imbalanced nutrition Potential for alteratiion in Nutrition/Potential for imbalanced nutrition Goals: Patient/caregiver agrees to and verbalizes understanding of need to use nutritional supplements and/or vitamins as prescribed Date Initiated: 09/21/2016 Target Resolution Date: 03/25/2017 Goal Status: Active Patient/caregiver verbalizes understanding of need to maintain therapeutic glucose control per primary care physician Date Initiated: 09/21/2016 Target Resolution Date: 03/25/2017 Goal Status: Active Interventions: Assess patient nutrition upon admission and as needed per policy Notes: ` Orientation to the Wound Care Program Nursing Diagnoses: Knowledge deficit related to the wound healing center  program Goals: Patient/caregiver will verbalize understanding of the Wales, [name] ([id number]) Date Initiated: 09/21/2016 Target Resolution Date: 03/25/2017 Goal Status: Active Interventions: Provide education on orientation to the wound center Notes: ` Pain, Acute or Chronic Nursing Diagnoses: Pain, acute or chronic: actual or potential Potential alteration in comfort, pain Goals: Patient will verbalize adequate pain control and receive pain control interventions during procedures as needed Date Initiated: 09/21/2016 Target Resolution Date: 03/25/2017 Goal Status: Active Patient/caregiver will verbalize adequate pain control between visits Date Initiated: 09/21/2016 Target Resolution Date: 03/25/2017 Goal Status: Active Patient/caregiver will verbalize comfort level met Date Initiated: 09/21/2016 Target Resolution Date: 03/25/2017 Goal Status: Active Interventions: Assess comfort goal upon admission Complete pain assessment as per visit requirements Notes: ` Wound/Skin Impairment Nursing Diagnoses: Impaired tissue integrity Goals: Ulcer/skin breakdown will have a volume reduction of 30% by week 4 Date Initiated: 09/21/2016 Target Resolution Date: 03/25/2017 Goal Status: Active Ulcer/skin breakdown will have a volume reduction of 50% by week 8 Date Initiated: 09/21/2016 Target Resolution Date: 03/25/2017 Goal Status: Active Ulcer/skin breakdown will have a volume reduction of 80% by week 12 Date Initiated: 09/21/2016 Target Resolution Date: 03/25/2017 Goal Status: Active Interventions: Assess ulceration(s) every visit Notes: VIOLETTA, LAVALLE ([id number]) Electronic Signature(s) Signed: 11/03/2017 4:36:47 PM By: Alric Quan Entered By: Alric Quan on 11/01/2017 08:17:46 [name], [name] ([id number]) -------------------------------------------------------------------------------- Pain Assessment Details Patient Name: Mable,  Delbert L. Date of  Service: 11/01/2017 8:00 AM Medical Record Number: [id number] Patient Account Number: [id number] Date of Birth/Sex: 04/15/1961 (56 y.o. Female) Treating RN: Carolyne Fiscal, Debi Primary Care Brandonn Capelli: Lavera Guise Other Clinician: Referring Audrey Eller: Lavera Guise Treating Zakyia Gagan/Extender: Tito Dine in Treatment: 52 Active Problems Location of Pain Severity and Description of Pain Patient Has Paino No Site Locations Pain Management and Medication Current Pain Management: Electronic Signature(s) Signed: 11/03/2017 4:36:47 PM By: Alric Quan Entered By: Alric Quan on 11/01/2017 08:07:48 [name], [name] ([id number]) -------------------------------------------------------------------------------- Patient/Caregiver Education Details Patient Name: [name] Date of Service: 11/01/2017 8:00 AM Medical Record Number: [id number] Patient Account Number: [id number] Date of Birth/Gender: 1961-05-26 (56 y.o. Female) Treating RN: Carolyne Fiscal, Debi Primary Care Physician: Lavera Guise Other Clinician: Referring Physician: Lavera Guise Treating Physician/Extender: Tito Dine in Treatment: 61 Education Assessment Education Provided To: Patient Education Topics Provided Wound/Skin Impairment: Handouts: Other: change dressing as ordered Methods: Demonstration, Explain/Verbal Responses: State content correctly Electronic Signature(s) Signed: 11/03/2017 4:36:47 PM By: Alric Quan Entered By: Alric Quan on 11/01/2017 09:14:54 [name], [name] ([id number]) -------------------------------------------------------------------------------- Wound Assessment Details Patient Name: [name] Date of Service: 11/01/2017 8:00 AM Medical Record Number: [id number] Patient Account Number: [id number] Date of Birth/Sex: 21-May-1961 (56 y.o. Female) Treating RN: Carolyne Fiscal, Debi Primary Care Fathima Bartl: Lavera Guise Other Clinician: Referring Tiona Ruane: Lavera Guise Treating Lodie Waheed/Extender: Ricard Dillon Weeks in Treatment: 58 Wound Status Wound Number: 3 Primary Pressure Ulcer Etiology: Wound Location: Right Gluteal fold Wound Status: Open Wounding Event: Pressure Injury Comorbid Asthma, Hypertension, Type II Diabetes, Date Acquired: 04/21/2016 History: Neuropathy Weeks Of Treatment: 58 Clustered Wound: No Photos Photo Uploaded By: Alric Quan on 11/01/2017 16:14:41 Wound Measurements Length: (cm) 0.2 Width: (cm) 0.9 Depth: (cm) 1.8 Area: (cm) 0.141 Volume: (cm) 0.254 % Reduction in Area: 95.4% % Reduction in Volume: 98.5% Epithelialization: Small (1-33%) Tunneling: No Undermining: No Wound Description Classification: Category/Stage IV Wound Margin: Distinct, outline attached Exudate Amount: Large Exudate Type: Serous Exudate Color: amber Foul Odor After Cleansing: No Slough/Fibrino Yes Wound Bed Granulation Amount: Medium (34-66%) Exposed Structure Granulation Quality: Red Fat Layer (Subcutaneous Tissue) Exposed: Yes Necrotic Amount: Medium (34-66%) Necrotic Quality: Adherent Slough Periwound Skin Texture Texture Color No Abnormalities Noted: No No Abnormalities Noted: No Callus: No Atrophie Blanche: No Kriz, Evellyn L. ([id number]) Crepitus: No Cyanosis: No Excoriation: No Ecchymosis: No Induration: No Erythema: No Rash: No Hemosiderin Staining: No Scarring: Yes Mottled: No Pallor: No Moisture Rubor: No No Abnormalities Noted: No Dry / Scaly: Yes Temperature / Pain Maceration: No Temperature: No Abnormality Tenderness on Palpation: Yes Wound Preparation Ulcer Cleansing: Rinsed/Irrigated with Saline Topical Anesthetic Applied: Other: lidocaine 4%, Treatment Notes Wound #3 (Right Gluteal fold) 1. Cleansed with: Clean wound with Normal Saline 2. Anesthetic Topical Lidocaine 4% cream to wound bed prior to debridement 3. Peri-wound Care: Skin Prep 4. Dressing Applied: Other dressing  (specify in notes) 5. Secondary [place] Notes hydrafera blue rope Electronic Signature(s) Signed: 11/03/2017 4:36:47 PM By: Alric Quan Entered By: Alric Quan on 11/01/2017 08:17:29 [name], [name] ([id number]) -------------------------------------------------------------------------------- Vitals Details Patient Name: [name] Date of Service: 11/01/2017 8:00 AM Medical Record Number: [id number] Patient Account Number: [id number] Date of Birth/Sex: 1961/03/23 (56 y.o. Female) Treating RN: Carolyne Fiscal, Debi Primary Care Kendall Arnell: Lavera Guise Other Clinician: Referring Mariafernanda Hendricksen: Lavera Guise Treating Kota Ciancio/Extender: Tito Dine in Treatment: 58 Vital Signs Time Taken: 08:08 Temperature (F): 98.4 Height (in): 63 Pulse (bpm): 94 Weight (lbs): 257 Respiratory  Rate (breaths/min): 18 Body Mass Index (BMI): 45.5 Blood Pressure (mmHg): 127/57 Reference Range: 80 - 120 mg / dl Electronic Signature(s) Signed: 11/03/2017 4:36:47 PM By: Alric Quan Entered By: Alric Quan on 11/01/2017 38:87:19

## 2017-11-29 ENCOUNTER — Encounter: Payer: BLUE CROSS/BLUE SHIELD | Attending: Internal Medicine | Admitting: Internal Medicine

## 2017-11-29 DIAGNOSIS — I10 Essential (primary) hypertension: Secondary | ICD-10-CM | POA: Insufficient documentation

## 2017-11-29 DIAGNOSIS — J449 Chronic obstructive pulmonary disease, unspecified: Secondary | ICD-10-CM | POA: Insufficient documentation

## 2017-11-29 DIAGNOSIS — K219 Gastro-esophageal reflux disease without esophagitis: Secondary | ICD-10-CM | POA: Insufficient documentation

## 2017-11-29 DIAGNOSIS — F411 Generalized anxiety disorder: Secondary | ICD-10-CM | POA: Diagnosis not present

## 2017-11-29 DIAGNOSIS — F17210 Nicotine dependence, cigarettes, uncomplicated: Secondary | ICD-10-CM | POA: Diagnosis not present

## 2017-11-29 DIAGNOSIS — E1142 Type 2 diabetes mellitus with diabetic polyneuropathy: Secondary | ICD-10-CM | POA: Diagnosis not present

## 2017-11-29 DIAGNOSIS — L89314 Pressure ulcer of right buttock, stage 4: Secondary | ICD-10-CM | POA: Insufficient documentation

## 2017-12-01 NOTE — Progress Notes (Signed)
[name] ([id number]) Visit Report for 11/29/2017 Debridement Details Patient Name: [name], [name] Date of Service: 11/29/2017 8:00 AM Medical Record Number: [id number] Patient Account Number: [id number] Date of Birth/Sex: 17-Mar-1961 (56 y.o. Female) Treating RN: Ashok Cordia, Debi Primary Care Provider: Joen [name] Other Clinician: Referring Provider: Joen [name] Treating Provider/Extender: Altamese [place] in Treatment: 62 Debridement Performed for Wound #3 Right Gluteal fold Assessment: Performed By: Physician Maxwell Caul, MD Debridement: Debridement Pre-procedure Verification/Time Yes - 08:23 Out Taken: Start Time: 08:04 Pain Control: Lidocaine 4% Topical Solution Level: Skin/Subcutaneous Tissue Total Area Debrided (L x W): 0.2 (cm) x 0.8 (cm) = 0.16 (cm) Tissue and other material Viable, Non-Viable, Exudate, Fibrin/Slough, Subcutaneous debrided: Instrument: Other : scoop Bleeding: Minimum Hemostasis Achieved: Pressure End Time: 08:28 Procedural Pain: 0 Post Procedural Pain: 0 Response to Treatment: Procedure was tolerated well Post Debridement Measurements of Total Wound Length: (cm) 0.3 Stage: Category/Stage IV Width: (cm) 0.8 Depth: (cm) 1.4 Volume: (cm) 0.264 Character of Wound/Ulcer Post Requires Further Debridement Debridement: Post Procedure Diagnosis Same as Pre-procedure Electronic Signature(s) Signed: 11/29/2017 5:20:26 PM By: Alejandro Mulling Signed: 11/29/2017 5:29:04 PM By: Baltazar Najjar MD Entered By: Baltazar Najjar on 11/29/2017 08:32:18 [name] ([id number]) -------------------------------------------------------------------------------- HPI Details Patient Name: [name] Date of Service: 11/29/2017 8:00 AM Medical Record Number: [id number] Patient Account Number: [id number] Date of Birth/Sex: 01-30-1961 (56 y.o. Female) Treating RN: Ashok Cordia, Debi Primary Care Provider: Joen [name] Other Clinician: Referring  Provider: Joen [name] Treating Provider/Extender: Altamese [place] in Treatment: 66 History of Present Illness Location: bilateral gluteal ulcerations and left popliteal ulceration with tunneling Quality: adits to intermittent aching to ischial ulcers, no discomfort to sinus tract Severity: right iscial ulcer with increased depth Duration: chronic ulcers to bilateral ischial ulcers and sinus tract Timing: The pain is intermittent in severity as far as how intense it becomes but is present all the time. Manipulationn makes this worse. Context: The wound occurred when the patient had a fall and was unconscious for about 48 hours laying on the floor and she had pressure injury at that stage. Modifying Factors: Other treatment(s) tried include:as noted below she has been seen by visiting wound care physicians or nurse practitioners and details have been noted Associated Signs and Symptoms: has yet to receive offloading cushions and mattress overlay HPI Description: 56 year old patient who was seen by visiting [name] wound care specialist for a wound on both her buttock and was found to have an unstageable wound on the right buttock for about 2 months. I understand that she had a fall and was laying on the floor for about 48 hours before she was found and taken to the ICU and had a long injury to her gluteal area from pressure and also had broken her right humerus. She has had a right proximal humerus fracture and has been followed up with orthopedics recently. The patient has a past medical history of type 2 diabetes mellitus, paraparesis, acute pyelonephritis, GERD, hypertension, glaucoma, chronic pain, anxiety neurosis, nicotine dependence, COPD. the patient had some debridement done and was to operative was recommended to use Silvadene dressing and offloading. She is a smoker and occasionally smokes a few cigarettes. the patient requested a second opinion for months and is here to  discuss her care. 09/21/16; the patient re-presents from home today for review of 3 different wounds. I note that she was seen in the clinic here in July at which time she had bilateral buttock wounds. It was apparently  suggested at that time that she use a wound VAC bridged to both wounds just near the initial tuberosity's bilaterally which she refused. The history was a bit difficult to put together. Apparently this patient became ill at the end of April of this year. She was found sitting on the floor she had apparently been for 2 days and subsequently admitted to hospital from 04/23/16 through 05/02/16 and at that point she was critically ill ultimately having sepsis secondary to UTI, nontraumatic rhabdomyolysis and diabetic ketoacidosis. She had acute renal failure and I think required ICU care including intubation. Patient states her wounds actually started at that point on the bilateral issue tuberosities however in reviewing the discharge summary from 5/8 I see no reference to wounds at that point. It did state that she had left lower extremity cellulitis however. Reviewing Epic I see no relevant x-rays. It would appear that her discharge creatinine was within the normal range and indeed on 9/15 her creatinine has remained normal. [facility] There the wounds on her bilateral Buttocks were dressed. Only just before her discharge from the nursing facility she developed an "knot" which was interpreted as cellulitis on the posterior aspect of her left knee she was given antibiotics. Apparently sometime late in July a this actually opened and became a wound at home health care was tending to however she is still having purulent drainage coming from this and by my understanding the wound depth is actually become unmeasurable. I am not really clear about what home health has been placing in any of these wound areas. The patient states that is  something with silver and it. She is not been systemically unwell no fever or chills her appetite is good. She is a diabetic poorly controlled however she states that her recent blood sugars at home have been in the low to mid 100s. 09/28/16 On evaluation today patient appears to continue to exhibit the 3 areas of ulceration that were noted previous. She did have an x-ray of the right pelvis which showed evidence of potential soft tissue infection but no obvious osteomyelitis. There was a discussion last office visit concerning the possibility of a wound VAC. Witth that being said the x-ray report suggested that an MRI may be more appropriate to further evaluate the extent. Subsequently in regard to the wound over the popliteal portion of the left lower extremity with tunneling at 12:00 the CT scan that was ordered was denied by insurance as they state Lasala, Etosha L. ([id number]) the patient has not had x-rays prior to advanced imaging. Patient states that she is frustrated with the situation overall. 10/05/16 in the interval since I last saw this patient last week she has had the x-ray of the knee performed. I did review that x-ray today and fortunately shows no evidence of osteomyelitis or other acute abnormality at this point in time. She continues to have the opening iin the posterior left popliteal space with tracking proximal up the posterior thigh. Nothing seems to have worsened but it also seems to have not improved. The same is true in regard to the right pressure ulcer over the gluteal region which extends toward the ischium. The left gluteal pressure ulcer actually appears to be doing somewhat better my opinion there is some necrotic slough but overall this appears fairly well. She tells me thatt she has some discomfort especially when home health is helping her with dressing changes as they do not know  her. At worse she rates her pain to be a 5 out of 10 right now it's more like a 1 out of  10. 10/07/16; still the patient has 3 different wound areas. She has a deep stage IV wound over her right ischial tuberosity. She is due to have an MRI next week. The wound over her left ischial tuberosity is more superficial and underwent debridement today. Finally she has a small open area in her left popliteal fossa the probes on measurably forward superiorly. Still a lot of drainage coming out of this. The CT scan that I ordered 3 weeks ago was questioned by her insurance company wanting a plain x-ray first. As I understand things result of this is nothing has been done in 3 weeks in terms of imaging the thigh and she has an MRI booked of this along with her pelvis for next week line 10/12/16; patient has a deep probing wound over the right ischiall tuberosity, stage III wound over the left visual tuberosity and a draining sinus in her left popliteal fossa. None of this much different from when I saw this 3 weeks ago. We have been using silver rope to the right ischial wound and a draining area in the left popliteal fossa. Plain silver alginate to the area on the left ischial tuberosity 10/19/16; the patient's wounds are essentially unchanged although the area on the left lower gluteal is actually improved. Our intake nurse noted drainage from the right initial tuberosity probing wound as well as the draining area in the left popliteal fossa. Both of these were cultured. She had x-rays I think at the insistence of her insurance company on 09/23/16 x-ray of the pelvis was not particularly helpful she did have soft tissue air over the right lower pelvis although with the depth of this wound this is not surprising. An x-ray of her left knee did not show any specific abnormalities. We are still using silver alginate to these wound areas. Her MRI is booked for 10/27 10/26/16; cultures of the purulent drainage in her right initial tuberosity wound grew moderate Proteus and few staph aureus. The same  organisms were cultured out of the left knee sinus tract posteriorly. The staph aureus is MRSA. I had started her on Augmentin last week I added doxycycline. The MRI of the left lower extremity and pelvis was finally done. The MRI of the femur showed subcutaneous soft tissue swelling edema fluid and myositis in the vastus lateralis muscle but no soft tissue abscess septic arthritis or osteomyelitis. MRI of the pelvis showed the left wound to be more expensive extending down to the bone there was osteomyelitis. Left hamstring tendons were also involved. No septic arthritis involving the hip. The decubitus ulcer on the right side showed no definite osteomyelitis or abscess.. The right hip wound is actually the one the probes 6 cm downward. But the MRI showing infection including osteomyelitis on the left explains the draining sinus in the popliteal fossa on the left. She did have antibiotics in the hospitalization last time and this extended into her nursing home stay but I'm not exactly sure what antibiotics and for what duration. According the patient this did include vancomycin with considerable effort of our staff we are able to get the patient into see Dr. Sampson Goon today. There were transportation difficulties. Her mother had open heart surgery and is in the ICU in [place] therefore her brother was unable to transport. Dr. Jarrett Ables office graciously arranged time to see her today. From  my point of view she is going to require IV vancomycin plus perhaps a third generation cephalosporin. I plan to keep her on doxycycline and Augmentin until the IV antibiotics can be arranged. 11/02/2016 - Gaylynn presents today for management of ulcers; She saw Dr. 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 She is to have the PICC line placed today and receive the initial dose for both antibiotics today. She has yet  to receive the offloading chair cushion and/or mattress overlay from home health, apparently this has been a 3 week process. I encouraged her to speak to home health regarding this matter, along with offering home health to contact the wouns care center with any questions or concerns. The left ischial pressure ulcer continues to imporve, while to right ischial ulcer has increased in depth. The popliteal fossa sinus tract remains unmeasurable due to the limitation of depth measurement (tract extends beyond our measuring devices). 11-16-2016 Ms. Cozza presents today for evaluation and management of bilateral ischial stage IV pressure ulcers and sinus tract to the left popliteal fossa. she is under the care of Dr. Sampson Goon for IV antibiotic therapy; she states that the vancomycin was placed on hold and will be restarted at a lower dose based on her renal function. She continues taking Zosyn in addition to the vancomycin. She also states that she has yet to receive offloading cushions from home health, according to her she does not qualify for these offloading cushions because "the ulcers are unstageable ". We will contact the home health agency today to lend clarity regarding her pressure ulcers. The left popliteal fossa sinus tract continues to be a measurable as it extends beyond the length of our measuring devices.. 11/30/16; the patient is still on vancomycin and Zosyn. The depth of the draining sinus behind her left popliteal fossa is down to [name], [name] ([id number]) 4 cm although there is still serosanguineous drainage coming out of this. She saw Dr. Sampson Goon of infectious disease yesterday the idea is to weeks more of IV antibiotics and then oral antibiotics although I have not read his note. The area on the left gluteal fold is just about healed. She has a 6 cm draining sinus over the right initial tuberosity although I cannot feel bone at the base of this. As far as the patient is aware  she has not had a recheck of her inflammatory markers. 12/07/16; patient is on vancomycin and Zosyn appointment with Dr. Sampson Goon on the 19th at which point the patient expects to have a change in antibiotics. Remarkable improvement over the wound over the left ischial tuberosity which is just about closed. The draining sinus in her popliteal fossa has 0.4 cm in depth. The area on the right ischial tuberosity still probes down 7 cm. This is closed and overall wound dimensions but not depth. 12/14/16; patient is completing her vancomycin and Zosyn and per her she is going to transition to Bactrim and Augmentin for another 3 weeks. The area on her left gluteal fold is closed except for some skin tears. The area behind her left knee is no longer has any depth. The only remaining area that is of clinical concern is on the right gluteal fold probing towards the right ischial tuberosity. Today this measures 6.9 cm in depth. Very gritty surface 12/21/16; patient is now on Bactrim and Augmentin as directed by infectious disease. This should be for another 2 weeks. The area  in her left gluteal fold and left popliteal are closed over and fully healed. Measurements today at 7 cm in the right buttock wound is unchanged from last week. 12/27/16; patient is on Bactrim and Augmentin for another week as directed by infectious disease. She has completed her IV antibiotics. She is not been systemically unwell no fever no chills. The area on her right buttock measured over 6 cm in depth. There is no palpable bone. No evidence of surrounding soft tissue infection. She is complaining of tongue irritation and has a history of thrush 01/04/17; patient is been back to see Dr. Sampson Goon, her Augmentin was stopped but he continued the Bactrim for another 3 weeks. Depth of the wound is 6.7 cm there is been no major change in either direction. She is not receive the wound VAC from home health I think because of confusion  about who is supposed to provided will actually talk to the home health agency today [[facility] The net no major change. 01/18/17; patient obtained her wound VAC about 10 days ago however for some reason it was not actually put on the wound. She is therefore here for [place] to apply this I guess. No other issues are noted. She is not complaining of pain fever drainage 02/01/17; patient is here now having the wound VAC or 3-4 weeks to a deep pressure area over the right initial tuberosity. This measures 6 cm in depth today which is about half a centimeter better than 2 weeks ago. There is no evidence she is systemically unwell no fever no chills no pain around the area. 02/15/17; I follow this patient every 2 weeks for a deep area over the right ischial tuberosity. This measures 5.5 cm today which is a continued improvement of 1.2 cm from 1/10 and down 0.5 cm from her visit 2 weeks ago 03/01/17; continued difficult area over the right initial tuberosity using the wound VAC. Depth today of 5.1 cm which is improved. Does not appear to be a lot of drainage in the canister. Antibiotics were finally stopped by Dr. Sampson Goon bactrim[]  and inflammatory markers have been repeated 03/15/17; fall this lady every 2 weeks for a difficult area over her lower right gluteal area/ischial tuberosity. Depth today at 4.6 cm. This is a slow but steady improvement in the depth of this wound. Although we have labeled this as a pressure ulcer there may have been an underlying infection here at one point before we saw her. She had osteomyelitis on the left extensively which is since resolved 03/29/17- patient is here for follow-up evaluation of her right ischial pressure ulcer. She continues with the wound VAC and home health. According to the nurse home health has been using less foams and appropriate and we will instruct accordingly. The patient continues to smoke, approximately 10 cigarettes a day. She has been advised to decrease  that in half by her next appointment, with a goal of complete cessation. She states her blood sugars have been consistently less than 150. She states that she spends most of her day position left lateral or prone. She does have an air mattress on her bed, she does not have an mattress for her chair, she states she cannot afford this. 04/12/17; patient is here for evaluation of her right ischial pressure ulcer. We continue to use a wound VAC with minimal improvement today the depth of this measuring 4.4 cm versus 4.6 cm 2 weeks ago. We are using a [employer] wound VAC on this area. There  is not excessive drainage no pain. The patient tells me she tries to keep off this in bed but is up in the wheelchair for 2 hours a day. She is limited in no her overall ambulation but is improving and apparently is getting bilateral lower extremity braces which she hopes will improve her ability to walk independently. 05/10/17; Depth at 3.3cm. Improved 05/24/17; depth at 3.5 cm. This is not improved since last time. Not clear if they are using collagen under the foam 06/07/17; depth that 2.9 which is a slight improvement. Still using the wound VAC. 06/21/17; depth is 2.9 cm which is exactly the same as last time. Also the appearance of this wound is completely the same. We have been using silver collagen under a wound VAC. 07/05/17 patient presents today for reevaluation concerning her right Ischial wound. She had switched insurance companies and so it does appear that the Wound VAC needs to be reauthorized which we are working on this morning. Nonetheless her wound has been saying about the same we have continued to use the silver collagen underneath the wound VAC. She has no discomfort. 07/19/17; patient follows every 2 weeks for her right if she'll wound. Since I last saw this a month ago her dimensions of come down to 2.6 cm. This is slightly down from a month ago when it was 2.9 cm in 4 months ago it was 4.6 cm. I note  that there [name], [name] ([id number]) were insurance company issues with regard to the wound VAC which she apparently is not having on for several weeks now. I think it would be reasonable to change therapies here. Will use silver alginate rope 08/02/17 on evaluation today patient's wound actually appears to be doing significantly better in regard to the depth as compared to her previous evaluation. She is having no discomfort. Unfortunately she never got the Wound VAC reapproved following the insurance issues from several weeks ago. With that being said it does not appear that she needs to requires the Wound VAC anymore and in fact I feel that she is doing better and making greater improvements at this point without it. Fortunately she has no nausea, vomiting, diarrhea, fevers, or chills. 08/16/17; patient's wound depth down to 2.3 cm. She is using silver alginate packing rope. Note that her wound VAC was discontinued due to insurance issues. This is not particularly surprising. She has not been systemically unwell and has no other new complaints 08/30/17; no change in depth. Still at 2.3 cm. Still with the same gritty surface requiring debridement. I've been using silver alginate for quite a period of time although this came down nicely in the last month 10/04/17; the depth of this is 2 cm however with careful inspection under high-intensity light most of the walls of this small probing sinus seemed to be normally epithelialized. I cannot exactly see the base of this however there is been no drainage. We've been using silver alginate there is no drainage on the dressing when it is removed. 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 There is no evidence of surrounding infection or pain. The patient had many review weakness in her bilateral legs. She is already been to see a neurologist who according the patient told her "you would never walk again" 11/01/17;  this is a patient I last saw a month ago. At that point the linear tunnel in her right buttock was 2 cm. It was not possible to see the depth of  this as skin had grown into the tunnel. In light of the fact that there was no drainage and no visible wound I recommended that we just allow her to go about her usual activity without dressing. She reports that almost immediately she noted drainage on her clothing although in spite of her instructions the contrary she did not come back to the clinic. She has not been specifically addressing this and is continued to no drainage without other symptoms either local or systemic 11/29/17; I follow this woman monthly. Last time the wound on her right buttock was 1.8 cm today measuring at 1.4 there has been gradual improvement in this. Concerning is the fact that the patient says that Dr. Sampson Goon of infectious disease changed her from doxycycline to Augmentin apparently because of the elevated inflammatory markers. She also said he did a culture of this area but she is not heard the results. I don't think I have his information available on care everywhere however I will check this. She is using Hydrofera Blue rope. The patient and her intake nurse report that she is still having identifiable drainage which certainly makes it clear that this is not closed. Electronic Signature(s) Signed: 11/29/2017 5:29:04 PM By: Baltazar Najjar MD Entered By: Baltazar Najjar on 11/29/2017 08:35:03 [name], [name] ([id number]) -------------------------------------------------------------------------------- Physical Exam Details Patient Name: [name] Date of Service: 11/29/2017 8:00 AM Medical Record Number: [id number] Patient Account Number: [id number] Date of Birth/Sex: 25-Aug-1961 (56 y.o. Female) Treating RN: Ashok Cordia, Debi Primary Care Provider: Joen [name] Other Clinician: Referring Provider: Joen [name] Treating Provider/Extender: Altamese [place] in  Treatment: 62 Constitutional Sitting or standing Blood Pressure is within target range for patient.. Pulse regular and within target range for patient.Marland Kitchen Respirations regular, non-labored and within target range.. Temperature is normal and within the target range for the patient.Marland Kitchen appears in no distress. Eyes Conjunctivae clear. No discharge. Respiratory Respiratory effort is easy and symmetric bilaterally. Rate is normal at rest and on room air.. Integumentary (Hair, Skin) No erythema around the wound. Psychiatric No evidence of depression, anxiety, or agitation. Calm, cooperative, and communicative. Appropriate interactions and affect.. Notes Wound exam; right ischial tuberosity initially tunneling area probably in the setting of osteomyelitis. She has a wound that is unusual in that skin is gone down into the depths of this. At the base of this under fluorescent light there is 2 small open areas. Using a scoop curette I attempted debridement of the base of this although I really can't see the base. She has normal- looking skin along the walls of the identifiable tunnel. There is no palpable subcutaneous issue here. Particularly no redness, no purulence no palpable nodule Electronic Signature(s) Signed: 11/29/2017 5:29:04 PM By: Baltazar Najjar MD Entered By: Baltazar Najjar on 11/29/2017 08:37:12 [name], [name] ([id number]) -------------------------------------------------------------------------------- Physician Orders Details Patient Name: [name] Date of Service: 11/29/2017 8:00 AM Medical Record Number: [id number] Patient Account Number: [id number] Date of Birth/Sex: Mar 04, 1961 (56 y.o. Female) Treating RN: Ashok Cordia, Debi Primary Care Provider: Joen [name] Other Clinician: Referring Provider: Joen [name] Treating Provider/Extender: Altamese  in Treatment: 46 Verbal / Phone Orders: Yes Clinician: Ashok Cordia, Debi Read Back and Verified: Yes Diagnosis  Coding Wound Cleansing Wound #3 Right Gluteal fold o Clean wound with Normal Saline. o Cleanse wound with mild soap and water Anesthetic Wound #3 Right Gluteal fold o Topical Lidocaine 4% cream applied to wound bed prior to debridement Skin Barriers/Peri-Wound Care Wound #3 Right Gluteal fold o Skin Prep  Primary Wound Dressing Wound #3 Right Gluteal fold o Hydrafera Blue - Classic Antibacterial Foam Tunneling Dressing (moisten before placing in wound) Secondary Dressing Wound #3 Right Gluteal fold o Other - telfa island Dressing Change Frequency Wound #3 Right Gluteal fold o Other: - HHRN to change twice a week Mondays and Thursdays Off-Loading Wound #3 Right Gluteal fold o Turn and reposition every 2 hours Additional Orders / Instructions Wound #3 Right Gluteal fold o Increase protein intake. Home Health Wound #3 Right Gluteal fold o Initiate Home Health for Skilled Nursing [facility] Pt stated that she received 4x4 hydrafera blue pads. This will not work for this wound. You will have to order the Classic Antibacterial Foam Tunneling Dressing (rope). o Home Health Nurse may visit PRN to address patientos wound care needs. EMBERLIE, GOTCHER ([id number]) o FACE TO FACE ENCOUNTER: MEDICARE and MEDICAID PATIENTS: I certify that this patient is under my care and that I had a face-to-face encounter that meets the physician face-to-face encounter requirements with this patient on this date. The encounter with the patient was in whole or in part for the following MEDICAL CONDITION: (primary reason for Home Healthcare) MEDICAL NECESSITY: I certify, that based on my findings, NURSING services are a medically necessary home health service. HOME BOUND STATUS: I certify that my clinical findings support that this patient is homebound (i.e., Due to illness or injury, pt requires aid of supportive devices such as crutches, cane, wheelchairs, walkers, the use of special  transportation or the assistance of another person to leave their place of residence. There is a normal inability to leave the home and doing so requires considerable and taxing effort. Other absences are for medical reasons / religious services and are infrequent or of short duration when for other reasons). o If current dressing causes regression in wound condition, may D/C ordered dressing product/s and apply Normal Saline Moist Dressing daily until next Wound Healing Center / Other MD appointment. Notify Wound Healing Center of regression in wound condition at [phone]. o Please direct any NON-WOUND related issues/requests for orders to patient's Primary Care Physician Electronic Signature(s) Signed: 11/29/2017 5:20:26 PM By: Alejandro Mulling Signed: 11/29/2017 5:29:04 PM By: Baltazar Najjar MD Entered By: Alejandro Mulling on 11/29/2017 08:31:17 [name], [name] ([id number]) -------------------------------------------------------------------------------- Problem List Details Patient Name: [name] Date of Service: 11/29/2017 8:00 AM Medical Record Number: [id number] Patient Account Number: [id number] Date of Birth/Sex: Apr 15, 1961 (56 y.o. 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 09/21/2016 09/21/2016 Resolved Problems ICD-10 Code Description Active Date Resolved Date L89.323 Pressure ulcer of left buttock, stage  3 09/21/2016 09/21/2016 L89.324 Pressure ulcer of left buttock, stage 4 11/16/2016 11/16/2016 Electronic Signature(s) Signed: 11/29/2017 5:29:04 PM By: Baltazar Najjar MD Entered By: Baltazar Najjar on 11/29/2017 08:31:40 [name], [name] ([id number]) -------------------------------------------------------------------------------- Progress Note Details Patient Name: [name] Date of Service: 11/29/2017 8:00 AM Medical Record Number: [id number] Patient Account Number: [id number] Date of Birth/Sex: [birth year] (56 y.o. Female) Treating RN: Ashok Cordia, Debi Primary Care Provider: Joen [name] Other Clinician: Referring Provider: Joen [name] Treating Provider/Extender: Leanord Hawking  MICHAEL G Weeks in Treatment: 62 Subjective History of Present Illness (HPI) The following HPI elements were documented for the patient's wound: Location: bilateral gluteal ulcerations and left popliteal ulceration with tunneling Quality: adits to intermittent aching to ischial ulcers, no discomfort to sinus tract Severity: right iscial ulcer with increased depth Duration: chronic ulcers to bilateral ischial ulcers and sinus tract Timing: The pain is intermittent in severity as far as how intense it becomes but is present all the time. Manipulationn makes this worse. Context: The wound occurred when the patient had a fall and was unconscious for about 48 hours laying on the floor and she had pressure injury at that stage. Modifying Factors: Other treatment(s) tried include:as noted below she has been seen by visiting wound care physicians or nurse practitioners and details have been noted Associated Signs and Symptoms: has yet to receive offloading cushions and mattress overlay 56 year old patient who was seen by visiting [name] wound care specialist for a wound on both her buttock and was found to have an unstageable wound on the right buttock for about 2 months. I understand that she had a fall and was laying on the  floor for about 48 hours before she was found and taken to the ICU and had a long injury to her gluteal area from pressure and also had broken her right humerus. She has had a right proximal humerus fracture and has been followed up with orthopedics recently. The patient has a past medical history of type 2 diabetes mellitus, paraparesis, acute pyelonephritis, GERD, hypertension, glaucoma, chronic pain, anxiety neurosis, nicotine dependence, COPD. the patient had some debridement done and was to operative was recommended to use Silvadene dressing and offloading. She is a smoker and occasionally smokes a few cigarettes. the patient requested a second opinion for months and is here to discuss her care. 09/21/16; the patient re-presents from home today for review of 3 different wounds. I note that she was seen in the clinic here in July at which time she had bilateral buttock wounds. It was apparently suggested at that time that she use a wound VAC bridged to both wounds just near the initial tuberosity's bilaterally which she refused. The history was a bit difficult to put together. Apparently this patient became ill at the end of April of this year. She was found sitting on the floor she had apparently been for 2 days and subsequently admitted to hospital from 04/23/16 through 05/02/16 and at that point she was critically ill ultimately having sepsis secondary to UTI, nontraumatic rhabdomyolysis and diabetic ketoacidosis. She had acute renal failure and I think required ICU care including intubation. Patient states her wounds actually started at that point on the bilateral issue tuberosities however in reviewing the discharge summary from 5/8 I see no reference to wounds at that point. It did state that she had left lower extremity cellulitis however. Reviewing Epic I see no relevant x-rays. It would appear that her discharge creatinine was within the normal range and indeed on 9/15 her creatinine has  remained normal. [facility] There the wounds on her bilateral Buttocks were dressed. Only just before her discharge from the nursing facility she developed an "knot" which was interpreted as cellulitis on the posterior aspect of her left knee she was given antibiotics. Apparently sometime late in July a this actually opened and became a wound at home health care was tending to however she is still having purulent drainage coming  from this and by my understanding the wound depth is actually become unmeasurable. I am not really clear about what home health has been placing in any of these wound areas. The patient states that is something with silver and it. She is not been systemically unwell no fever or chills her appetite is good. She is a diabetic poorly controlled however she states that her recent blood sugars at home have been in the low to mid 100s. 09/28/16 On evaluation today patient appears to continue to exhibit the 3 areas of ulceration that were noted previous. She did have an x-ray of the right pelvis which showed evidence of potential soft tissue infection but no obvious osteomyelitis. There Kucharski, Urijah L. ([id number]) was a discussion last office visit concerning the possibility of a wound VAC. Witth that being said the x-ray report suggested that an MRI may be more appropriate to further evaluate the extent. Subsequently in regard to the wound over the popliteal portion of the left lower extremity with tunneling at 12:00 the CT scan that was ordered was denied by insurance as they state the patient has not had x-rays prior to advanced imaging. Patient states that she is frustrated with the situation overall. 10/05/16 in the interval since I last saw this patient last week she has had the x-ray of the knee performed. I did review that x-ray today and fortunately shows no evidence of osteomyelitis or other acute abnormality at  this point in time. She continues to have the opening iin the posterior left popliteal space with tracking proximal up the posterior thigh. Nothing seems to have worsened but it also seems to have not improved. The same is true in regard to the right pressure ulcer over the gluteal region which extends toward the ischium. The left gluteal pressure ulcer actually appears to be doing somewhat better my opinion there is some necrotic slough but overall this appears fairly well. She tells me thatt she has some discomfort especially when home health is helping her with dressing changes as they do not know her. At worse she rates her pain to be a 5 out of 10 right now it's more like a 1 out of 10. 10/07/16; still the patient has 3 different wound areas. She has a deep stage IV wound over her right ischial tuberosity. She is due to have an MRI next week. The wound over her left ischial tuberosity is more superficial and underwent debridement today. Finally she has a small open area in her left popliteal fossa the probes on measurably forward superiorly. Still a lot of drainage coming out of this. The CT scan that I ordered 3 weeks ago was questioned by her insurance company wanting a plain x-ray first. As I understand things result of this is nothing has been done in 3 weeks in terms of imaging the thigh and she has an MRI booked of this along with her pelvis for next week line 10/12/16; patient has a deep probing wound over the right ischiall tuberosity, stage III wound over the left visual tuberosity and a draining sinus in her left popliteal fossa. None of this much different from when I saw this 3 weeks ago. We have been using silver rope to the right ischial wound and a draining area in the left popliteal fossa. Plain silver alginate to the area on the left ischial tuberosity 10/19/16; the patient's wounds are essentially unchanged although the area on the left lower gluteal is actually improved.  Our intake nurse  noted drainage from the right initial tuberosity probing wound as well as the draining area in the left popliteal fossa. Both of these were cultured. She had x-rays I think at the insistence of her insurance company on 09/23/16 x-ray of the pelvis was not particularly helpful she did have soft tissue air over the right lower pelvis although with the depth of this wound this is not surprising. An x-ray of her left knee did not show any specific abnormalities. We are still using silver alginate to these wound areas. Her MRI is booked for 10/27 10/26/16; cultures of the purulent drainage in her right initial tuberosity wound grew moderate Proteus and few staph aureus. The same organisms were cultured out of the left knee sinus tract posteriorly. The staph aureus is MRSA. I had started her on Augmentin last week I added doxycycline. The MRI of the left lower extremity and pelvis was finally done. The MRI of the femur showed subcutaneous soft tissue swelling edema fluid and myositis in the vastus lateralis muscle but no soft tissue abscess septic arthritis or osteomyelitis. MRI of the pelvis showed the left wound to be more expensive extending down to the bone there was osteomyelitis. Left hamstring tendons were also involved. No septic arthritis involving the hip. The decubitus ulcer on the right side showed no definite osteomyelitis or abscess.. The right hip wound is actually the one the probes 6 cm downward. But the MRI showing infection including osteomyelitis on the left explains the draining sinus in the popliteal fossa on the left. She did have antibiotics in the hospitalization last time and this extended into her nursing home stay but I'm not exactly sure what antibiotics and for what duration. According the patient this did include vancomycin with considerable effort of our staff we are able to get the patient into see Dr. Sampson Goon today. There were  transportation difficulties. Her mother had open heart surgery and is in the ICU in [place] therefore her brother was unable to transport. Dr. Jarrett Ables office graciously arranged time to see her today. From my point of view she is going to require IV vancomycin plus perhaps a third generation cephalosporin. I plan to keep her on doxycycline and Augmentin until the IV antibiotics can be arranged. 11/02/2016 - [place] presents today for management of ulcers; She saw Dr. 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 She is to have the PICC line placed today and receive the initial dose for both antibiotics today. She has yet to receive the offloading chair cushion and/or mattress overlay from home health, apparently this has been a 3 week process. I encouraged her to speak to home health regarding this matter, along with offering home health to contact the wouns care center with any questions or concerns. The left ischial pressure ulcer continues to imporve, while to right ischial ulcer has increased in depth. The popliteal fossa sinus tract remains unmeasurable due to the limitation of depth measurement (tract extends beyond our measuring devices). 11-16-2016 Ms. Alwine presents today for evaluation and management of bilateral ischial stage IV pressure ulcers and sinus tract to the left popliteal fossa. she is under the care of Dr. Sampson Goon for IV antibiotic therapy; she states that the vancomycin was placed on hold and will be restarted at a lower dose based on her renal function. She continues taking Zosyn in addition to the vancomycin. She also states that she has yet to receive offloading cushions  from home health, according to her she does not qualify for these offloading cushions because "the ulcers are unstageable ". We will contact the home health agency [facility], [name]. ([id number]) today to lend  clarity regarding her pressure ulcers. The left popliteal fossa sinus tract continues to be a measurable as it extends beyond the length of our measuring devices.. 11/30/16; the patient is still on vancomycin and Zosyn. The depth of the draining sinus behind her left popliteal fossa is down to 4 cm although there is still serosanguineous drainage coming out of this. She saw Dr. Sampson Goon of infectious disease yesterday the idea is to weeks more of IV antibiotics and then oral antibiotics although I have not read his note. The area on the left gluteal fold is just about healed. She has a 6 cm draining sinus over the right initial tuberosity although I cannot feel bone at the base of this. As far as the patient is aware she has not had a recheck of her inflammatory markers. 12/07/16; patient is on vancomycin and Zosyn appointment with Dr. Sampson Goon on the 19th at which point the patient expects to have a change in antibiotics. Remarkable improvement over the wound over the left ischial tuberosity which is just about closed. The draining sinus in her popliteal fossa has 0.4 cm in depth. The area on the right ischial tuberosity still probes down 7 cm. This is closed and overall wound dimensions but not depth. 12/14/16; patient is completing her vancomycin and Zosyn and per her she is going to transition to Bactrim and Augmentin for another 3 weeks. The area on her left gluteal fold is closed except for some skin tears. The area behind her left knee is no longer has any depth. The only remaining area that is of clinical concern is on the right gluteal fold probing towards the right ischial tuberosity. Today this measures 6.9 cm in depth. Very gritty surface 12/21/16; patient is now on Bactrim and Augmentin as directed by infectious disease. This should be for another 2 weeks. The area in her left gluteal fold and left popliteal are closed over and fully healed. Measurements today at 7 cm in the  right buttock wound is unchanged from last week. 12/27/16; patient is on Bactrim and Augmentin for another week as directed by infectious disease. She has completed her IV antibiotics. She is not been systemically unwell no fever no chills. The area on her right buttock measured over 6 cm in depth. There is no palpable bone. No evidence of surrounding soft tissue infection. She is complaining of tongue irritation and has a history of thrush 01/04/17; patient is been back to see Dr. Sampson Goon, her Augmentin was stopped but he continued the Bactrim for another 3 weeks. Depth of the wound is 6.7 cm there is been no major change in either direction. She is not receive the wound VAC from home health I think because of confusion about who is supposed to provided will actually talk to the home health agency today [[facility]]. The net no major change. 01/18/17; patient obtained her wound VAC about 10 days ago however for some reason it was not actually put on the wound. She is therefore here for [place] to apply this I guess. No other issues are noted. She is not complaining of pain fever drainage 02/01/17; patient is here now having the wound VAC or 3-4 weeks to a deep pressure area over the right initial tuberosity. This measures 6 cm in depth today which is  about half a centimeter better than 2 weeks ago. There is no evidence she is systemically unwell no fever no chills no pain around the area. 02/15/17; I follow this patient every 2 weeks for a deep area over the right ischial tuberosity. This measures 5.5 cm today which is a continued improvement of 1.2 cm from 1/10 and down 0.5 cm from her visit 2 weeks ago 03/01/17; continued difficult area over the right initial tuberosity using the wound VAC. Depth today of 5.1 cm which is improved. Does not appear to be a lot of drainage in the canister. Antibiotics were finally stopped by Dr. Sampson Goon bactrim[]  and inflammatory markers have been repeated 03/15/17; fall  this lady every 2 weeks for a difficult area over her lower right gluteal area/ischial tuberosity. Depth today at 4.6 cm. This is a slow but steady improvement in the depth of this wound. Although we have labeled this as a pressure ulcer there may have been an underlying infection here at one point before we saw her. She had osteomyelitis on the left extensively which is since resolved 03/29/17- patient is here for follow-up evaluation of her right ischial pressure ulcer. She continues with the wound VAC and home health. According to the nurse home health has been using less foams and appropriate and we will instruct accordingly. The patient continues to smoke, approximately 10 cigarettes a day. She has been advised to decrease that in half by her next appointment, with a goal of complete cessation. She states her blood sugars have been consistently less than 150. She states that she spends most of her day position left lateral or prone. She does have an air mattress on her bed, she does not have an mattress for her chair, she states she cannot afford this. 04/12/17; patient is here for evaluation of her right ischial pressure ulcer. We continue to use a wound VAC with minimal improvement today the depth of this measuring 4.4 cm versus 4.6 cm 2 weeks ago. We are using a [employer] wound VAC on this area. There is not excessive drainage no pain. The patient tells me she tries to keep off this in bed but is up in the wheelchair for 2 hours a day. She is limited in no her overall ambulation but is improving and apparently is getting bilateral lower extremity braces which she hopes will improve her ability to walk independently. 05/10/17; Depth at 3.3cm. Improved 05/24/17; depth at 3.5 cm. This is not improved since last time. Not clear if they are using collagen under the foam 06/07/17; depth that 2.9 which is a slight improvement. Still using the wound VAC. 06/21/17; depth is 2.9 cm which is exactly the same as  last time. Also the appearance of this wound is completely the same. We have been using silver collagen under a wound VAC. 07/05/17 patient presents today for reevaluation concerning her right Ischial wound. She had switched insurance companies and so it does appear that the Wound VAC needs to be reauthorized which we are working on this morning. Nonetheless her wound has been saying about the same we have continued to use the silver collagen underneath the wound VAC. She has [name], [name] ([id number]) no discomfort. 07/19/17; patient follows every 2 weeks for her right if she'll wound. Since I last saw this a month ago her dimensions of come down to 2.6 cm. This is slightly down from a month ago when it was 2.9 cm in 4 months ago it was 4.6 cm. I  note that there were insurance company issues with regard to the wound VAC which she apparently is not having on for several weeks now. I think it would be reasonable to change therapies here. Will use silver alginate rope 08/02/17 on evaluation today patient's wound actually appears to be doing significantly better in regard to the depth as compared to her previous evaluation. She is having no discomfort. Unfortunately she never got the Wound VAC reapproved following the insurance issues from several weeks ago. With that being said it does not appear that she needs to requires the Wound VAC anymore and in fact I feel that she is doing better and making greater improvements at this point without it. Fortunately she has no nausea, vomiting, diarrhea, fevers, or chills. 08/16/17; patient's wound depth down to 2.3 cm. She is using silver alginate packing rope. Note that her wound VAC was discontinued due to insurance issues. This is not particularly surprising. She has not been systemically unwell and has no other new complaints 08/30/17; no change in depth. Still at 2.3 cm. Still with the same gritty surface requiring debridement. I've been using silver alginate  for quite a period of time although this came down nicely in the last month 10/04/17; the depth of this is 2 cm however with careful inspection under high-intensity light most of the walls of this small probing sinus seemed to be normally epithelialized. I cannot exactly see the base of this however there is been no drainage. We've been using silver alginate there is no drainage on the dressing when it is removed. 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 There is no evidence of surrounding infection or pain. The patient had many review weakness in her bilateral legs. She is already been to see a neurologist who according the patient told her "you would never walk again" 11/01/17; this is a patient I last saw a month ago. At that point the linear tunnel in her right buttock was 2 cm. It was not possible to see the depth of this as skin had grown into the tunnel. In light of the fact that there was no drainage and no visible wound I recommended that we just allow her to go about her usual activity without dressing. She reports that almost immediately she noted drainage on her clothing although in spite of her instructions the contrary she did not come back to the clinic. She has not been specifically addressing this and is continued to no drainage without other symptoms either local or systemic 11/29/17; I follow this woman monthly. Last time the wound on her right buttock was 1.8 cm today measuring at 1.4 there has been gradual improvement in this. Concerning is the fact that the patient says that Dr. Sampson Goon of infectious disease changed her from doxycycline to Augmentin apparently because of the elevated inflammatory markers. She also said he did a culture of this area but she is not heard the results. I don't think I have his information available on care everywhere however I will check this. She is using Hydrofera Blue rope. The patient and her intake  nurse report that she is still having identifiable drainage which certainly makes it clear that this is not closed. Objective Constitutional Sitting or standing Blood Pressure is within target range for patient.. Pulse regular and within target range for patient.Marland Kitchen Respirations regular, non-labored and within target range.. Temperature is normal and within the target range for the patient.Marland Kitchen appears in no distress. Vitals Time  Taken: 8:07 AM, Height: 63 in, Weight: 257 lbs, BMI: 45.5, Temperature: 98.4 F, Pulse: 93 bpm, Respiratory Rate: 18 breaths/min, Blood Pressure: 140/71 mmHg. Eyes Conjunctivae clear. No discharge. Respiratory Respiratory effort is easy and symmetric bilaterally. Rate is normal at rest and on room air.[name]. [name], Jameriah LMarland Kitchen. ([id number]) Psychiatric No evidence of depression, anxiety, or agitation. Calm, cooperative, and communicative. Appropriate interactions and affect.. General Notes: Wound exam; right ischial tuberosity initially tunneling area probably in the setting of osteomyelitis. She has a wound that is unusual in that skin is gone down into the depths of this. At the base of this under fluorescent light there is 2 small open areas. Using a scoop curette I attempted debridement of the base of this although I really can't see the base. She has normal-looking skin along the walls of the identifiable tunnel. There is no palpable subcutaneous issue here. Particularly no redness, no purulence no palpable nodule Integumentary (Hair, Skin) No erythema around the wound. Wound #3 status is Open. Original cause of wound was Pressure Injury. The wound is located on the Right Gluteal fold. The wound measures 0.2cm length x 0.8cm width x 1.4cm depth; 0.126cm^2 area and 0.176cm^3 volume. There is Fat Layer (Subcutaneous Tissue) Exposed exposed. There is no tunneling or undermining noted. There is a large amount of serous drainage noted. The wound margin is distinct with the  outline attached to the wound base. There is medium (34-66%) red granulation within the wound bed. There is a medium (34-66%) amount of necrotic tissue within the wound bed including Adherent Slough. The periwound skin appearance exhibited: Scarring, Dry/Scaly. The periwound skin appearance did not exhibit: Callus, Crepitus, Excoriation, Induration, Rash, Maceration, Atrophie Blanche, Cyanosis, Ecchymosis, Hemosiderin Staining, Mottled, Pallor, Rubor, Erythema. Periwound temperature was noted as No Abnormality. The periwound has tenderness on palpation. 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 There was a Skin/Subcutaneous Tissue Debridement ([id number]) debridement with total area of 0.16 sq cm performed by Maxwell CaulOBSON, MICHAEL G, MD. with the following instrument(s): scoop to remove Viable and Non-Viable tissue/material including Exudate, Fibrin/Slough, and Subcutaneous after achieving pain control using Lidocaine 4% Topical Solution. A time out was conducted at 08:23, prior to the start of the procedure. A Minimum amount of bleeding was controlled with Pressure. The procedure was tolerated well with a pain level of 0 throughout and a pain level of 0 following the procedure. Post Debridement Measurements: 0.3cm length x 0.8cm width x 1.4cm depth; 0.264cm^3 volume. Post debridement Stage noted as Category/Stage IV. Character of Wound/Ulcer Post Debridement requires further debridement. Post procedure Diagnosis Wound #3: Same as Pre-Procedure Strack, [name] ([id number]) Plan Wound Cleansing: Wound #3 Right Gluteal fold: Clean wound with Normal Saline. Cleanse wound with mild soap and water Anesthetic: Wound #3 Right Gluteal fold: Topical Lidocaine 4% cream applied to wound bed  prior to debridement Skin Barriers/Peri-Wound Care: Wound #3 Right Gluteal fold: Skin Prep Primary Wound Dressing: Wound #3 Right Gluteal fold: Hydrafera Blue - Classic Antibacterial Foam Tunneling Dressing (moisten before placing in wound) Secondary Dressing: Wound #3 Right Gluteal fold: Other - t[place]a [place] Dressing Change Frequency: Wound #3 Right Gluteal fold: Other: - HHRN to change twice a week Mondays and Thursdays Off-Loading: Wound #3 Right Gluteal fold: Turn and reposition every 2 hours Additional Orders / Instructions: Wound #3  Right Gluteal fold: Increase protein intake. Home Health: Wound #3 Right Gluteal fold: Initiate Home Health for Skilled Nursing [facility] Pt stated that she received 4x4 hydrafera blue pads. This will not work for this wound. You will have to order the Classic Antibacterial Foam Tunneling Dressing (rope). Home Health Nurse may visit PRN to address patient s wound care needs. FACE TO FACE ENCOUNTER: MEDICARE and MEDICAID PATIENTS: I certify that this patient is under my care and that I had a face-to-face encounter that meets the physician face-to-face encounter requirements with this patient on this date. The encounter with the patient was in whole or in part for the following MEDICAL CONDITION: (primary reason for Home Healthcare) MEDICAL NECESSITY: I certify, that based on my findings, NURSING services are a medically necessary home health service. HOME BOUND STATUS: I certify that my clinical findings support that this patient is homebound (i.e., Due to illness or injury, pt requires aid of supportive devices such as crutches, cane, wheelchairs, walkers, the use of special transportation or the assistance of another person to leave their place of residence. There is a normal inability to leave the home and doing so requires considerable and taxing effort. Other absences are for medical reasons / religious services and are infrequent or of short  duration when for other reasons). If current dressing causes regression in wound condition, may D/C ordered dressing product/s and apply Normal Saline Moist Dressing daily until next Wound Healing Center / Other MD appointment. Notify Wound Healing Center of regression in wound condition at [phone]. Please direct any NON-WOUND related issues/requests for orders to patient's Primary Care Physician o Arscott, Keyshawna L. ([id number]) #1 this is a difficult clinical situation. She has a small open area at the depth of an epithelialized final that it would one point I think was a drainage site from underlying osteomyelitis of the right ischial tuberosity. I attempted debridement here got some necrotic material but I can't actually see the wound bed. #2 adjustments made by Dr. Sampson Goon are somewhat concerning in terms of changing the antibiotic from doxycycline to Augmentin apparently in response to it elevated inflammatory markers. There was also a culture done but the patient is unaware of the results # #3 in terms of dressings we are using Hydrofera Blue rope about the only alternative here would be Iodosorb ointment Electronic Signature(s) Signed: 11/29/2017 5:29:04 PM By: Baltazar Najjar MD Entered By: Baltazar Najjar on 11/29/2017 08:41:27 Grima, [name] ([id number]) -------------------------------------------------------------------------------- SuperBill Details Patient Name: [name] Date of Service: 11/29/2017 Medical Record Number: [id number] Patient Account Number: [id number] Date of Birth/Sex: 03-24-1961 (55 y.o. Female) Treating RN: Ashok Cordia, Debi Primary Care Provider: Joen [name] Other Clinician: Referring Provider: Joen [name] Treating Provider/Extender: Altamese Hazard in Treatment: 63 Diagnosis Coding ICD-10 Codes Code Description L89.314 Pressure ulcer of right buttock, stage 4 E11.42 Type 2 diabetes mellitus with diabetic polyneuropathy Facility  Procedures CPT4 Code: [id number] Description: 11042 - DEB SUBQ TISSUE 20 SQ CM/< ICD-10 Diagnosis Description L89.314 Pressure ulcer of right buttock, stage 4 E11.42 Type 2 diabetes mellitus with diabetic polyneuropathy Modifier: Quantity: 1 Physician Procedures CPT4 Code: [id number] Description: 11042 - WC PHYS SUBQ TISS 20 SQ CM ICD-10 Diagnosis Description L89.314 Pressure ulcer of right buttock, stage 4 E11.42 Type 2 diabetes mellitus with diabetic polyneuropathy Modifier: Quantity: 1 Electronic Signature(s) Signed: 11/30/2017 9:27:31 AM By: Alejandro Mulling Entered By: Alejandro Mulling on 11/30/2017 09:27:30

## 2017-12-01 NOTE — Progress Notes (Signed)
[name], [name] ([id number]) Visit Report for 11/29/2017 Arrival Information Details Patient Name: [name], [name] Date of Service: 11/29/2017 8:00 AM Medical Record Number: [id number] Patient Account Number: [id number] Date of Birth/Sex: 08/26/1961 (56 y.o. Female) Treating RN: Carolyne Fiscal, Debi Primary Care Samanthia Howland: Lavera Guise Other Clinician: Referring Astaria Nanez: Lavera Guise Treating Ehren Berisha/Extender: Tito Dine in Treatment: 62 Visit Information History Since Last Visit All ordered tests and consults were completed: No Patient Arrived: Wheel Chair Added or deleted any medications: No Arrival Time: 08:02 Any new allergies or adverse reactions: No Accompanied By: self Had a fall or experienced change in No Transfer Assistance: EasyPivot Patient activities of daily living that may affect Lift risk of falls: Patient Identification Verified: Yes Signs or symptoms of abuse/neglect since last visito No Secondary Verification Process Yes Hospitalized since last visit: No Completed: Has Dressing in Place as Prescribed: Yes Patient Requires Transmission-Based No Precautions: Pain Present Now: No Patient Has Alerts: Yes Patient Alerts: DM II Electronic Signature(s) Signed: 11/29/2017 5:20:26 PM By: Alric Quan Entered By: Alric Quan on 11/29/2017 08:07:37 Lucien, [name] ([id number]) -------------------------------------------------------------------------------- Encounter Discharge Information Details Patient Name: [name] Date of Service: 11/29/2017 8:00 AM Medical Record Number: [id number] Patient Account Number: [id number] Date of Birth/Sex: [birth year] (56 y.o. Female) Treating RN: Carolyne Fiscal, Debi Primary Care Elliett Guarisco: Lavera Guise Other Clinician: Referring Delonta Yohannes: Lavera Guise Treating Lynix Bonine/Extender: Tito Dine in Treatment: 37 Encounter Discharge Information Items Discharge Pain Level: 0 Discharge Condition:  Stable Ambulatory Status: Wheelchair Discharge Destination: Home Transportation: Other Accompanied By: self Schedule Follow-up Appointment: Yes Medication Reconciliation completed and No provided to Patient/Care Kevonte Vanecek: Patient Clinical Summary of Care: Declined Electronic Signature(s) Signed: 11/29/2017 9:10:05 AM By: Alric Quan Entered By: Alric Quan on 11/29/2017 09:10:05 Snedden, [name] ([id number]) -------------------------------------------------------------------------------- Lower Extremity Assessment Details Patient Name: [name] Date of Service: 11/29/2017 8:00 AM Medical Record Number: [id number] Patient Account Number: [id number] Date of Birth/Sex: 1961-03-29 (56 y.o. Female) Treating RN: Ahmed Prima Primary Care Makaveli Hoard: Lavera Guise Other Clinician: Referring Vail Vuncannon: Lavera Guise Treating Amran Malter/Extender: Ricard Dillon Weeks in Treatment: 62 Electronic Signature(s) Signed: 11/29/2017 5:20:26 PM By: Alric Quan Entered By: Alric Quan on 11/29/2017 08:17:19 [place], [name] ([id number]) -------------------------------------------------------------------------------- Multi Wound Chart Details Patient Name: [name] Date of Service: 11/29/2017 8:00 AM Medical Record Number: [id number] Patient Account Number: [id number] Date of Birth/Sex: April 15, 1961 (56 y.o. Female) Treating RN: Carolyne Fiscal, Debi Primary Care Yazen Rosko: Lavera Guise Other Clinician: Referring Macall Mccroskey: Lavera Guise Treating Edgel Degnan/Extender: Tito Dine in Treatment: 42 Vital Signs Height(in): 46 Pulse(bpm): 36 Weight(lbs): 90 Blood Pressure(mmHg): 140/71 Body Mass Index(BMI): 46 Temperature(F): 98.4 Respiratory Rate 18 (breaths/min): Photos: [3:No Photos] [N/A:N/A] Wound Location: [3:Right Gluteal fold] [N/A:N/A] Wounding Event: [3:Pressure Injury] [N/A:N/A] Primary Etiology: [3:Pressure Ulcer] [N/A:N/A] Comorbid History: [3:Asthma,  Hypertension, Type II Diabetes, Neuropathy] [N/A:N/A] Date Acquired: [3:04/21/2016] [N/A:N/A] Weeks of Treatment: [3:62] [N/A:N/A] Wound Status: [3:Open] [N/A:N/A] Measurements L x W x D [3:0.2x0.8x1.4] [N/A:N/A] (cm) Area (cm) : [3:0.126] [N/A:N/A] Volume (cm) : [3:0.176] [N/A:N/A] % Reduction in Area: [3:95.90%] [N/A:N/A] % Reduction in Volume: [3:99.00%] [N/A:N/A] Classification: [3:Category/Stage IV] [N/A:N/A] Exudate Amount: [3:Large] [N/A:N/A] Exudate Type: [3:Serous] [N/A:N/A] Exudate Color: [3:amber] [N/A:N/A] Wound Margin: [3:Distinct, outline attached] [N/A:N/A] Granulation Amount: [3:Medium (34-66%)] [N/A:N/A] Granulation Quality: [3:Red] [N/A:N/A] Necrotic Amount: [3:Medium (34-66%)] [N/A:N/A] Exposed Structures: [3:Fat Layer (Subcutaneous Tissue) Exposed: Yes] [N/A:N/A] Epithelialization: [3:Small (1-33%)] [N/A:N/A] Debridement: [3:Debridement ([id number])] [N/A:N/A] Pre-procedure [3:08:23] [N/A:N/A] Verification/Time Out Taken: Pain Control: [3:Lidocaine 4% Topical Solution] [N/A:N/A] Tissue Debrided: [3:Fibrin/Slough, Exudates,  Subcutaneous] [N/A:N/A] Level: [3:Skin/Subcutaneous Tissue] [N/A:N/A] Debridement Area (sq cm): 0.16 [N/A:N/A] Instrument: [3:Other(scoop)] [N/A:N/A] Bleeding: [3:Minimum] [N/A:N/A] Hemostasis Achieved: [3:Pressure] [N/A:N/A] Procedural Pain: 0 N/A N/A Post Procedural Pain: 0 N/A N/A Debridement Treatment Procedure was tolerated well N/A N/A Response: Post Debridement 0.3x0.8x1.4 N/A N/A Measurements L x W x D (cm) Post Debridement Volume: 0.264 N/A N/A (cm) Post Debridement Stage: Category/Stage IV N/A N/A Periwound Skin Texture: Scarring: Yes N/A N/A Excoriation: No Induration: No Callus: No Crepitus: No Rash: No Periwound Skin Moisture: Dry/Scaly: Yes N/A N/A Maceration: No Periwound Skin Color: Atrophie Blanche: No N/A N/A Cyanosis: No Ecchymosis: No Erythema: No Hemosiderin Staining: No Mottled: No Pallor:  No Rubor: No Temperature: No Abnormality N/A N/A Tenderness on Palpation: Yes N/A N/A Wound Preparation: Ulcer Cleansing: N/A N/A Rinsed/Irrigated with Saline Topical Anesthetic Applied: Other: lidocaine 4% Procedures Performed: Debridement N/A N/A Treatment Notes Electronic Signature(s) Signed: 11/29/2017 5:29:04 PM By: Linton Ham MD Entered By: Linton Ham on 11/29/2017 08:31:58 [place], [name] ([id number]) -------------------------------------------------------------------------------- Multi-Disciplinary Care Plan Details Patient Name: [name] Date of Service: 11/29/2017 8:00 AM Medical Record Number: [id number] Patient Account Number: [id number] Date of Birth/Sex: 11/21/1961 (56 y.o. Female) Treating RN: Carolyne Fiscal, Debi Primary Care Nochum Fenter: Lavera Guise Other Clinician: Referring Maclovio Henson: Lavera Guise Treating Sims Laday/Extender: Tito Dine in Treatment: 45 Active Inactive ` Abuse / Safety / Falls / Self Care Management Nursing Diagnoses: Potential for falls Goals: Patient will remain injury free Date Initiated: 09/21/2016 Target Resolution Date: 03/25/2017 Goal Status: Active Interventions: Assess fall risk on admission and as needed Notes: ` Nutrition Nursing Diagnoses: Imbalanced nutrition Potential for alteratiion in Nutrition/Potential for imbalanced nutrition Goals: Patient/caregiver agrees to and verbalizes understanding of need to use nutritional supplements and/or vitamins as prescribed Date Initiated: 09/21/2016 Target Resolution Date: 03/25/2017 Goal Status: Active Patient/caregiver verbalizes understanding of need to maintain therapeutic glucose control per primary care physician Date Initiated: 09/21/2016 Target Resolution Date: 03/25/2017 Goal Status: Active Interventions: Assess patient nutrition upon admission and as needed per policy Notes: ` Orientation to the Wound Care Program Nursing Diagnoses: Knowledge deficit  related to the wound healing center program Goals: Patient/caregiver will verbalize understanding of the [place], [name] ([id number]) Date Initiated: 09/21/2016 Target Resolution Date: 03/25/2017 Goal Status: Active Interventions: Provide education on orientation to the wound center Notes: ` Pain, Acute or Chronic Nursing Diagnoses: Pain, acute or chronic: actual or potential Potential alteration in comfort, pain Goals: Patient will verbalize adequate pain control and receive pain control interventions during procedures as needed Date Initiated: 09/21/2016 Target Resolution Date: 03/25/2017 Goal Status: Active Patient/caregiver will verbalize adequate pain control between visits Date Initiated: 09/21/2016 Target Resolution Date: 03/25/2017 Goal Status: Active Patient/caregiver will verbalize comfort level met Date Initiated: 09/21/2016 Target Resolution Date: 03/25/2017 Goal Status: Active Interventions: Assess comfort goal upon admission Complete pain assessment as per visit requirements Notes: ` Wound/Skin Impairment Nursing Diagnoses: Impaired tissue integrity Goals: Ulcer/skin breakdown will have a volume reduction of 30% by week 4 Date Initiated: 09/21/2016 Target Resolution Date: 03/25/2017 Goal Status: Active Ulcer/skin breakdown will have a volume reduction of 50% by week 8 Date Initiated: 09/21/2016 Target Resolution Date: 03/25/2017 Goal Status: Active Ulcer/skin breakdown will have a volume reduction of 80% by week 12 Date Initiated: 09/21/2016 Target Resolution Date: 03/25/2017 Goal Status: Active Interventions: Assess ulceration(s) every visit Notes: BRISTYN, KULESZA ([id number]) Electronic Signature(s) Signed: 11/29/2017 5:20:26 PM By: Alric Quan Entered By: Alric Quan on 11/29/2017 08:17:24 Nanda, Retaj L. ([id number]) --------------------------------------------------------------------------------  Pain Assessment  Details Patient Name: ALAWNA, GRAYBEAL Date of Service: 11/29/2017 8:00 AM Medical Record Number: [id number] Patient Account Number: [id number] Date of Birth/Sex: [birth year] (56 y.o. Female) Treating RN: Carolyne Fiscal, Debi Primary Care Suhaas Agena: Lavera Guise Other Clinician: Referring Keani Gotcher: Lavera Guise Treating Isabell Bonafede/Extender: Tito Dine in Treatment: 14 Active Problems Location of Pain Severity and Description of Pain Patient Has Paino No Site Locations Pain Management and Medication Current Pain Management: Electronic Signature(s) Signed: 11/29/2017 5:20:26 PM By: Alric Quan Entered By: Alric Quan on 11/29/2017 08:07:42 Cyphers, [name] ([id number]) -------------------------------------------------------------------------------- Patient/Caregiver Education Details Patient Name: [name] Date of Service: 11/29/2017 8:00 AM Medical Record Number: [id number] Patient Account Number: [id number] Date of Birth/Gender: [birth year] (56 y.o. Female) Treating RN: Carolyne Fiscal, Debi Primary Care Physician: Lavera Guise Other Clinician: Referring Physician: Lavera Guise Treating Physician/Extender: Tito Dine in Treatment: 66 Education Assessment Education Provided To: Patient Education Topics Provided Wound/Skin Impairment: Handouts: Caring for Your Ulcer, Other: change dressing as ordered Methods: Demonstration, Explain/Verbal Responses: State content correctly Electronic Signature(s) Signed: 11/29/2017 5:20:26 PM By: Alric Quan Entered By: Alric Quan on 11/29/2017 09:10:58 Zamorano, [name] ([id number]) -------------------------------------------------------------------------------- Wound Assessment Details Patient Name: [name] Date of Service: 11/29/2017 8:00 AM Medical Record Number: [id number] Patient Account Number: [id number] Date of Birth/Sex: 1961/06/23 (56 y.o. Female) Treating RN: Carolyne Fiscal, Debi Primary Care [place] Greb:  Lavera Guise Other Clinician: Referring Rielynn Trulson: Lavera Guise Treating Rani Sisney/Extender: Ricard Dillon Weeks in Treatment: 62 Wound Status Wound Number: 3 Primary Pressure Ulcer Etiology: Wound Location: Right Gluteal fold Wound Status: Open Wounding Event: Pressure Injury Comorbid Asthma, Hypertension, Type II Diabetes, Date Acquired: 04/21/2016 History: Neuropathy Weeks Of Treatment: 62 Clustered Wound: No Photos Photo Uploaded By: Alric Quan on 11/29/2017 15:26:52 Wound Measurements Length: (cm) 0.2 Width: (cm) 0.8 Depth: (cm) 1.4 Area: (cm) 0.126 Volume: (cm) 0.176 % Reduction in Area: 95.9% % Reduction in Volume: 99% Epithelialization: Small (1-33%) Tunneling: No Undermining: No Wound Description Classification: Category/Stage IV Wound Margin: Distinct, outline attached Exudate Amount: Large Exudate Type: Serous Exudate Color: amber Foul Odor After Cleansing: No Slough/Fibrino Yes Wound Bed Granulation Amount: Medium (34-66%) Exposed Structure Granulation Quality: Red Fat Layer (Subcutaneous Tissue) Exposed: Yes Necrotic Amount: Medium (34-66%) Necrotic Quality: Adherent Slough Periwound Skin Texture Texture Color No Abnormalities Noted: No No Abnormalities Noted: No Callus: No Atrophie Blanche: No Fischl, Christalynn L. ([id number]) Crepitus: No Cyanosis: No Excoriation: No Ecchymosis: No Induration: No Erythema: No Rash: No Hemosiderin Staining: No Scarring: Yes Mottled: No Pallor: No Moisture Rubor: No No Abnormalities Noted: No Dry / Scaly: Yes Temperature / Pain Maceration: No Temperature: No Abnormality Tenderness on Palpation: Yes Wound Preparation Ulcer Cleansing: Rinsed/Irrigated with Saline Topical Anesthetic Applied: Other: lidocaine 4%, Treatment Notes Wound #3 (Right Gluteal fold) 1. Cleansed with: Clean wound with Normal Saline 2. Anesthetic Topical Lidocaine 4% cream to wound bed prior to debridement 3. Peri-wound  Care: Skin Prep 4. Dressing Applied: Hydrafera Blue 5. Secondary [place] Notes hydrafera blue rope Electronic Signature(s) Signed: 11/29/2017 5:20:26 PM By: Alric Quan Entered By: Alric Quan on 11/29/2017 08:15:08 [place], [name] ([id number]) -------------------------------------------------------------------------------- [place] Details Patient Name: [name] Date of Service: 11/29/2017 8:00 AM Medical Record Number: [id number] Patient Account Number: [id number] Date of Birth/Sex: 1961-02-02 (56 y.o. Female) Treating RN: Carolyne Fiscal, Debi Primary Care Shantoya Geurts: Lavera Guise Other Clinician: Referring Renata Gambino: Lavera Guise Treating Yutaka Holberg/Extender: Tito Dine in Treatment: 62 Vital Signs Time Taken: 08:07 Temperature (F): 98.4 Height (in): 63  Pulse (bpm): 93 Weight (lbs): 257 Respiratory Rate (breaths/min): 18 Body Mass Index (BMI): 45.5 Blood Pressure (mmHg): 140/71 Reference Range: 80 - 120 mg / dl Electronic Signature(s) Signed: 11/29/2017 5:20:26 PM By: Alric Quan Entered By: Alric Quan on 11/29/2017 08:10:44

## 2017-12-14 NOTE — Progress Notes (Addendum)
ROGUE, PAUTLER ([id number]) Visit Report for 01/02/2018 Chief Complaint Document Details Patient Name: [name] Date of Service: 01/02/2018 8:00 AM Medical Record Number: [id number] Patient Account Number: [id number] Date of Birth/Sex: 15-Mar-1961 (56 y.o. Female) Treating RN: Ashok Cordia, Debi Primary Care Provider: Joen [name] Other Clinician: Referring Provider: Joen [name] Treating Provider/Extender: Linwood Dibbles, Elizabella Nolet Weeks in Treatment: 68 Information Obtained from: Patient Chief Complaint Patient is here for follow-up of a relation of her right ischial pressure ulcer Electronic Signature(s) Signed: 01/03/2018 8:32:41 AM By: Lenda Kelp PA-C Entered By: Lenda Kelp on 01/02/2018 08:20:25 [name], [name] ([id number]) -------------------------------------------------------------------------------- HPI Details Patient Name: [name] Date of Service: 01/02/2018 8:00 AM Medical Record Number: [id number] Patient Account Number: [id number] Date of Birth/Sex: 23-Sep-1961 (56 y.o. Female) Treating RN: Ashok Cordia, Debi Primary Care Provider: Joen [name] Other Clinician: Referring Provider: Joen [name] Treating Provider/Extender: Linwood Dibbles, Providence Stivers Weeks in Treatment: 26 History of Present Illness Location: bilateral gluteal ulcerations and left popliteal ulceration with tunneling Quality: adits to intermittent aching to ischial ulcers, no discomfort to sinus tract Severity: right iscial ulcer with increased depth Duration: chronic ulcers to bilateral ischial ulcers and sinus tract Timing: The pain is intermittent in severity as far as how intense it becomes but is present all the time. Manipulationn makes this worse. Context: The wound occurred when the patient had a fall and was unconscious for about 48 hours laying on the floor and she had pressure injury at that stage. Modifying Factors: Other treatment(s) tried include:as noted below she has been seen by visiting wound care  physicians or nurse practitioners and details have been noted Associated Signs and Symptoms: has yet to receive offloading cushions and mattress overlay HPI Description: 56 year old patient who was seen by visiting [name] wound care specialist for a wound on both her buttock and was found to have an unstageable wound on the right buttock for about 2 months. I understand that she had a fall and was laying on the floor for about 48 hours before she was found and taken to the ICU and had a long injury to her gluteal area from pressure and also had broken her right humerus. She has had a right proximal humerus fracture and has been followed up with orthopedics recently. The patient has a past medical history of type 2 diabetes mellitus, paraparesis, acute pyelonephritis, GERD, hypertension, glaucoma, chronic pain, anxiety neurosis, nicotine dependence, COPD. the patient had some debridement done and was to operative was recommended to use Silvadene dressing and offloading. She is a smoker and occasionally smokes a few cigarettes. the patient requested a second opinion for months and is here to discuss her care. 09/21/16; the patient re-presents from home today for review of 3 different wounds. I note that she was seen in the clinic here in July at which time she had bilateral buttock wounds. It was apparently suggested at that time that she use a wound VAC bridged to both wounds just near the initial tuberosity's bilaterally which she refused. The history was a bit difficult to put together. Apparently this patient became ill at the end of April of this year. She was found sitting on the floor she had apparently been for 2 days and subsequently admitted to hospital from 04/23/16 through 05/02/16 and at that point she was critically ill ultimately having sepsis secondary to UTI, nontraumatic rhabdomyolysis and diabetic ketoacidosis. She had acute renal failure and I think required ICU care including  intubation. Patient states her wounds actually  started at that point on the bilateral issue tuberosities however in reviewing the discharge summary from 5/8 I see no reference to wounds at that point. It did state that she had left lower extremity cellulitis however. Reviewing Epic I see no relevant x-rays. It would appear that her discharge creatinine was within the normal range and indeed on 9/15 her creatinine has remained normal. [facility] There the wounds on her bilateral Buttocks were dressed. Only just before her discharge from the nursing facility she developed an "knot" which was interpreted as cellulitis on the posterior aspect of her left knee she was given antibiotics. Apparently sometime late in July a this actually opened and became a wound at home health care was tending to however she is still having purulent drainage coming from this and by my understanding the wound depth is actually become unmeasurable. I am not really clear about what home health has been placing in any of these wound areas. The patient states that is something with silver and it. She is not been systemically unwell no fever or chills her appetite is good. She is a diabetic poorly controlled however she states that her recent blood sugars at home have been in the low to mid 100s. 09/28/16 On evaluation today patient appears to continue to exhibit the 3 areas of ulceration that were noted previous. She did have an x-ray of the right pelvis which showed evidence of potential soft tissue infection but no obvious osteomyelitis. There was a discussion last office visit concerning the possibility of a wound VAC. Witth that being said the x-ray report suggested that an MRI may be more appropriate to further evaluate the extent. Subsequently in regard to the wound over the popliteal portion of the left lower extremity with tunneling at 12:00 the CT scan that was  ordered was denied by insurance as they state Sweetser, Minervia L. ([id number]) the patient has not had x-rays prior to advanced imaging. Patient states that she is frustrated with the situation overall. 10/05/16 in the interval since I last saw this patient last week she has had the x-ray of the knee performed. I did review that x-ray today and fortunately shows no evidence of osteomyelitis or other acute abnormality at this point in time. She continues to have the opening iin the posterior left popliteal space with tracking proximal up the posterior thigh. Nothing seems to have worsened but it also seems to have not improved. The same is true in regard to the right pressure ulcer over the gluteal region which extends toward the ischium. The left gluteal pressure ulcer actually appears to be doing somewhat better my opinion there is some necrotic slough but overall this appears fairly well. She tells me thatt she has some discomfort especially when home health is helping her with dressing changes as they do not know her. At worse she rates her pain to be a 5 out of 10 right now it's more like a 1 out of 10. 10/07/16; still the patient has 3 different wound areas. She has a deep stage IV wound over her right ischial tuberosity. She is due to have an MRI next week. The wound over her left ischial tuberosity is more superficial and underwent debridement today. Finally she has a small open area in her left popliteal fossa the probes on measurably forward superiorly. Still a lot of drainage coming out of this. The CT scan that I ordered 3 weeks ago was  questioned by her insurance company wanting a plain x-ray first. As I understand things result of this is nothing has been done in 3 weeks in terms of imaging the thigh and she has an MRI booked of this along with her pelvis for next week line 10/12/16; patient has a deep probing wound over the right ischiall tuberosity, stage III wound over the left visual  tuberosity and a draining sinus in her left popliteal fossa. None of this much different from when I saw this 3 weeks ago. We have been using silver rope to the right ischial wound and a draining area in the left popliteal fossa. Plain silver alginate to the area on the left ischial tuberosity 10/19/16; the patient's wounds are essentially unchanged although the area on the left lower gluteal is actually improved. Our intake nurse noted drainage from the right initial tuberosity probing wound as well as the draining area in the left popliteal fossa. Both of these were cultured. She had x-rays I think at the insistence of her insurance company on 09/23/16 x-ray of the pelvis was not particularly helpful she did have soft tissue air over the right lower pelvis although with the depth of this wound this is not surprising. An x-ray of her left knee did not show any specific abnormalities. We are still using silver alginate to these wound areas. Her MRI is booked for 10/27 10/26/16; cultures of the purulent drainage in her right initial tuberosity wound grew moderate Proteus and few staph aureus. The same organisms were cultured out of the left knee sinus tract posteriorly. The staph aureus is MRSA. I had started her on Augmentin last week I added doxycycline. The MRI of the left lower extremity and pelvis was finally done. The MRI of the femur showed subcutaneous soft tissue swelling edema fluid and myositis in the vastus lateralis muscle but no soft tissue abscess septic arthritis or osteomyelitis. MRI of the pelvis showed the left wound to be more expensive extending down to the bone there was osteomyelitis. Left hamstring tendons were also involved. No septic arthritis involving the hip. The decubitus ulcer on the right side showed no definite osteomyelitis or abscess.. The right hip wound is actually the one the probes 6 cm downward. But the MRI showing infection including osteomyelitis on the left  explains the draining sinus in the popliteal fossa on the left. She did have antibiotics in the hospitalization last time and this extended into her nursing home stay but I'm not exactly sure what antibiotics and for what duration. According the patient this did include vancomycin with considerable effort of our staff we are able to get the patient into see Dr. Sampson Goon today. There were transportation difficulties. Her mother had open heart surgery and is in the ICU in [place] therefore her brother was unable to transport. Dr. Jarrett Ables office graciously arranged time to see her today. From my point of view she is going to require IV vancomycin plus perhaps a third generation cephalosporin. I plan to keep her on doxycycline and Augmentin until the IV antibiotics can be arranged. 11/02/2016 - Deetta presents today for management of ulcers; She saw Dr. 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 She is to have the PICC line placed today and receive the initial dose for both antibiotics today. She has yet to receive the offloading chair cushion and/or mattress overlay from home health, apparently this has been a 3 week process.  I encouraged her to speak to home health regarding this matter, along with offering home health to contact the wouns care center with any questions or concerns. The left ischial pressure ulcer continues to imporve, while to right ischial ulcer has increased in depth. The popliteal fossa sinus tract remains unmeasurable due to the limitation of depth measurement (tract extends beyond our measuring devices). 11-16-2016 Ms. Fetting presents today for evaluation and management of bilateral ischial stage IV pressure ulcers and sinus tract to the left popliteal fossa. she is under the care of Dr. Sampson Goon for IV antibiotic therapy; she states that the vancomycin was placed on hold and  will be restarted at a lower dose based on her renal function. She continues taking Zosyn in addition to the vancomycin. She also states that she has yet to receive offloading cushions from home health, according to her she does not qualify for these offloading cushions because "the ulcers are unstageable ". We will contact the home health agency today to lend clarity regarding her pressure ulcers. The left popliteal fossa sinus tract continues to be a measurable as it extends beyond the length of our measuring devices.. 11/30/16; the patient is still on vancomycin and Zosyn. The depth of the draining sinus behind her left popliteal fossa is down to Broecker, Monti L. ([id number]) 4 cm although there is still serosanguineous drainage coming out of this. She saw Dr. Sampson Goon of infectious disease yesterday the idea is to weeks more of IV antibiotics and then oral antibiotics although I have not read his note. The area on the left gluteal fold is just about healed. She has a 6 cm draining sinus over the right initial tuberosity although I cannot feel bone at the base of this. As far as the patient is aware she has not had a recheck of her inflammatory markers. 12/07/16; patient is on vancomycin and Zosyn appointment with Dr. Sampson Goon on the 19th at which point the patient expects to have a change in antibiotics. Remarkable improvement over the wound over the left ischial tuberosity which is just about closed. The draining sinus in her popliteal fossa has 0.4 cm in depth. The area on the right ischial tuberosity still probes down 7 cm. This is closed and overall wound dimensions but not depth. 12/14/16; patient is completing her vancomycin and Zosyn and per her she is going to transition to Bactrim and Augmentin for another 3 weeks. The area on her left gluteal fold is closed except for some skin tears. The area behind her left knee is no longer has any depth. The only remaining area that is of clinical  concern is on the right gluteal fold probing towards the right ischial tuberosity. Today this measures 6.9 cm in depth. Very gritty surface 12/21/16; patient is now on Bactrim and Augmentin as directed by infectious disease. This should be for another 2 weeks. The area in her left gluteal fold and left popliteal are closed over and fully healed. Measurements today at 7 cm in the right buttock wound is unchanged from last week. 12/27/16; patient is on Bactrim and Augmentin for another week as directed by infectious disease. She has completed her IV antibiotics. She is not been systemically unwell no fever no chills. The area on her right buttock measured over 6 cm in depth. There is no palpable bone. No evidence of surrounding soft tissue infection. She is complaining of tongue irritation and has a history of thrush 01/04/17; patient is been back to see Dr. Sampson Goon,  her Augmentin was stopped but he continued the Bactrim for another 3 weeks. Depth of the wound is 6.7 cm there is been no major change in either direction. She is not receive the wound VAC from home health I think because of confusion about who is supposed to provided will actually talk to the home health agency today [[facility] The net no major change. 01/18/17; patient obtained her wound VAC about 10 days ago however for some reason it was not actually put on the wound. She is therefore here for [place] to apply this I guess. No other issues are noted. She is not complaining of pain fever drainage 02/01/17; patient is here now having the wound VAC or 3-4 weeks to a deep pressure area over the right initial tuberosity. This measures 6 cm in depth today which is about half a centimeter better than 2 weeks ago. There is no evidence she is systemically unwell no fever no chills no pain around the area. 02/15/17; I follow this patient every 2 weeks for a deep area over the right ischial tuberosity. This measures 5.5 cm today which is a continued  improvement of 1.2 cm from 1/10 and down 0.5 cm from her visit 2 weeks ago 03/01/17; continued difficult area over the right initial tuberosity using the wound VAC. Depth today of 5.1 cm which is improved. Does not appear to be a lot of drainage in the canister. Antibiotics were finally stopped by Dr. Sampson Goon bactrim[]  and inflammatory markers have been repeated 03/15/17; fall this lady every 2 weeks for a difficult area over her lower right gluteal area/ischial tuberosity. Depth today at 4.6 cm. This is a slow but steady improvement in the depth of this wound. Although we have labeled this as a pressure ulcer there may have been an underlying infection here at one point before we saw her. She had osteomyelitis on the left extensively which is since resolved 03/29/17- patient is here for follow-up evaluation of her right ischial pressure ulcer. She continues with the wound VAC and home health. According to the nurse home health has been using less foams and appropriate and we will instruct accordingly. The patient continues to smoke, approximately 10 cigarettes a day. She has been advised to decrease that in half by her next appointment, with a goal of complete cessation. She states her blood sugars have been consistently less than 150. She states that she spends most of her day position left lateral or prone. She does have an air mattress on her bed, she does not have an mattress for her chair, she states she cannot afford this. 04/12/17; patient is here for evaluation of her right ischial pressure ulcer. We continue to use a wound VAC with minimal improvement today the depth of this measuring 4.4 cm versus 4.6 cm 2 weeks ago. We are using a [employer] wound VAC on this area. There is not excessive drainage no pain. The patient tells me she tries to keep off this in bed but is up in the wheelchair for 2 hours a day. She is limited in no her overall ambulation but is improving and apparently is getting  bilateral lower extremity braces which she hopes will improve her ability to walk independently. 05/10/17; Depth at 3.3cm. Improved 05/24/17; depth at 3.5 cm. This is not improved since last time. Not clear if they are using collagen under the foam 06/07/17; depth that 2.9 which is a slight improvement. Still using the wound VAC. 06/21/17; depth is 2.9 cm which  is exactly the same as last time. Also the appearance of this wound is completely the same. We have been using silver collagen under a wound VAC. 07/05/17 patient presents today for reevaluation concerning her right Ischial wound. She had switched insurance companies and so it does appear that the Wound VAC needs to be reauthorized which we are working on this morning. Nonetheless her wound has been saying about the same we have continued to use the silver collagen underneath the wound VAC. She has no discomfort. 07/19/17; patient follows every 2 weeks for her right if she'll wound. Since I last saw this a month ago her dimensions of come down to 2.6 cm. This is slightly down from a month ago when it was 2.9 cm in 4 months ago it was 4.6 cm. I note that there Kohn, Latrisa L. ([id number]) were insurance company issues with regard to the wound VAC which she apparently is not having on for several weeks now. I think it would be reasonable to change therapies here. Will use silver alginate rope 08/02/17 on evaluation today patient's wound actually appears to be doing significantly better in regard to the depth as compared to her previous evaluation. She is having no discomfort. Unfortunately she never got the Wound VAC reapproved following the insurance issues from several weeks ago. With that being said it does not appear that she needs to requires the Wound VAC anymore and in fact I feel that she is doing better and making greater improvements at this point without it. Fortunately she has no nausea, vomiting, diarrhea, fevers, or chills. 08/16/17;  patient's wound depth down to 2.3 cm. She is using silver alginate packing rope. Note that her wound VAC was discontinued due to insurance issues. This is not particularly surprising. She has not been systemically unwell and has no other new complaints 08/30/17; no change in depth. Still at 2.3 cm. Still with the same gritty surface requiring debridement. I've been using silver alginate for quite a period of time although this came down nicely in the last month 10/04/17; the depth of this is 2 cm however with careful inspection under high-intensity light most of the walls of this small probing sinus seemed to be normally epithelialized. I cannot exactly see the base of this however there is been no drainage. We've been using silver alginate there is no drainage on the dressing when it is removed. 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 There is no evidence of surrounding infection or pain. The patient had many review weakness in her bilateral legs. She is already been to see a neurologist who according the patient told her "you would never walk again" 11/01/17; this is a patient I last saw a month ago. At that point the linear tunnel in her right buttock was 2 cm. It was not possible to see the depth of this as skin had grown into the tunnel. In light of the fact that there was no drainage and no visible wound I recommended that we just allow her to go about her usual activity without dressing. She reports that almost immediately she noted drainage on her clothing although in spite of her instructions the contrary she did not come back to the clinic. She has not been specifically addressing this and is continued to no drainage without other symptoms either local or systemic 11/29/17; I follow this woman monthly. Last time the wound on her right buttock was 1.8 cm today measuring at 1.4 there  has been gradual improvement in this. Concerning is the fact that  the patient says that Dr. Sampson GoonFitzgerald of infectious disease changed her from doxycycline to Augmentin apparently because of the elevated inflammatory markers. She also said he did a culture of this area but she is not heard the results. I don't think I have his information available on care everywhere however I will check this. She is using Hydrofera Blue rope. The patient and her intake nurse report that she is still having identifiable drainage which certainly makes it clear that this is not closed. 01/02/17 on evaluation today patient appears to be doing fairly well in regard to her left gluteal wound. She has been [facility] with [facility] here in our office. We actually see her once a month and then subsequently are performing weekly dressing changes with Hydrofera Blue Dressing rope during nurse visits one time a week. Today there really was not much drainage at this point. However this has happened previously where she had no drainage and was essentially thought to be close internally although the external had not completely pulled together. However then she reopened and began draining again. He has been mentioned the possibility of her seeing plastic surgery to try to get this finally and completely healed. Electronic Signature(s) Signed: 01/03/2018 8:32:41 AM By: Lenda KelpStone III, Akera Snowberger PA-C Entered By: Lenda KelpStone III, Kyndall Amero on 01/02/2018 08:33:42 Brozek, [name] L. ([id number]) -------------------------------------------------------------------------------- Physical Exam Details Patient Name: Katelyn GeorgesWARD, Jaydalee L. Date of Service: 01/02/2018 8:00 AM Medical Record Number: [id number] Patient Account Number: [id number] Date of Birth/Sex: 06/19/1961 (56 y.o. Female) Treating RN: Ashok CordiaPinkerton, Debi Primary Care Provider: Joen LauraBLISS, [name] Other Clinician: Referring Provider: Joen LauraBLISS, [name] Treating Provider/Extender: STONE III, Stefana Lodico Weeks in Treatment: 6366 Constitutional Well-nourished and well-hydrated in  no acute distress. Respiratory normal breathing without difficulty. clear to auscultation bilaterally. Cardiovascular regular rate and rhythm with normal S1, S2. Psychiatric this patient is able to make decisions and demonstrates good insight into disease process. Alert and Oriented x 3. pleasant and cooperative. Notes Patient's wound did not have any identifiable drainage on evaluation today. On probing the wound did not seem to be significantly smaller despite one months of care over the past several weeks. She was not having any pain. There is no evidence of infection. Electronic Signature(s) Signed: 01/03/2018 8:32:41 AM By: Lenda KelpStone III, Jevon Shells PA-C Entered By: Lenda KelpStone III, Hansini Clodfelter on 01/02/2018 08:34:42 Ratliff, [name] L. ([id number]) -------------------------------------------------------------------------------- Physician Orders Details Patient Name: Katelyn GeorgesWARD, Nicolet L. Date of Service: 01/02/2018 8:00 AM Medical Record Number: [id number] Patient Account Number: [id number] Date of Birth/Sex: 06/19/1961 (56 y.o. Female) Treating RN: Ashok CordiaPinkerton, Debi Primary Care Provider: Joen LauraBLISS, [name] Other Clinician: Referring Provider: Joen LauraBLISS, [name] Treating Provider/Extender: Altamese CarolinaOBSON, MICHAEL G Weeks in Treatment: 2066 Verbal / Phone Orders: Yes Clinician: Ashok CordiaPinkerton, Debi Read Back and Verified: Yes Diagnosis Coding Wound Cleansing Wound #3 Right Gluteal fold o Clean wound with Normal Saline. Anesthetic (add to Medication List) Wound #3 Right Gluteal fold o Topical Lidocaine 4% cream applied to wound bed prior to debridement (In Clinic Only). Skin Barriers/Peri-Wound Care Wound #3 Right Gluteal fold o Skin Prep Primary Wound Dressing Wound #3 Right Gluteal fold o Hydrafera Blue - rope Secondary Dressing Wound #3 Right Gluteal fold o [place] Dressing Change Frequency Wound #3 Right Gluteal fold o Change dressing every week Follow-up Appointments o Nurse Visit as needed o Other: -  Once a week nurse visit for dressing change unless scheduled to see MD. Off-Loading Wound #3 Right Gluteal fold o Turn  and reposition every 2 hours Additional Orders / Instructions Wound #3 Right Gluteal fold o Increase protein intake. Patient Medications Allergies: Biaxin Notifications Medication Indication Start End lidocaine Oelke, Brixton L. ([id number]) Notifications Medication Indication Start End DOSE 1 - topical 4 % cream - 1 cream topical Electronic Signature(s) Signed: 01/02/2018 2:58:30 PM By: Alejandro Mulling Signed: 01/03/2018 4:33:34 PM By: Baltazar Najjar MD Previous Signature: 12/12/2017 2:47:55 PM Version By: Alejandro Mulling Previous Signature: 12/13/2017 8:14:14 AM Version By: Baltazar Najjar MD Entered By: Alejandro Mulling on 01/02/2018 08:24:31 Martino, [name] ([id number]) -------------------------------------------------------------------------------- Prescription 12/12/2017 Patient Name: [name] Provider: Maxwell Caul MD Date of Birth: [birth year] NPI#: [id number] Sex: F DEA#: [id number] Phone #: [phone] License #: [id number] Patient Address: [facility] [address] [facility] [place], [place] [address] [address], [address] [place], [place] [address] [phone] Allergies Biaxin Medication Medication: Route: Strength: Form: lidocaine topical 4% cream Class: TOPICAL LOCAL ANESTHETICS Dose: Frequency / Time: Indication: 1 1 cream topical Number of Refills: Number of Units: 0 Generic Substitution: Start Date: End Date: Administered at Substitution Permitted Facility: No Note to Pharmacy: Signature(s): Date(s): Electronic Signature(s) Signed: 01/02/2018 2:58:30 PM By: Alejandro Mulling Signed: 01/03/2018 4:33:34 PM By: Baltazar Najjar MD Entered By: Alejandro Mulling on 01/02/2018 08:24:31 Holst, [name]  ([id number]) --------------------------------------------------------------------------------  Problem List Details Patient Name: [name] Date of Service: 01/02/2018 8:00 AM Medical Record Number: [id number] Patient Account Number: [id number] Date of Birth/Sex: [birth year] (56 y.o. 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 09/21/2016 09/21/2016 Resolved Problems ICD-10 Code Description Active Date Resolved Date L89.323 Pressure ulcer of left buttock, stage 3 09/21/2016 09/21/2016 L89.324 Pressure ulcer of left buttock, stage 4 11/16/2016 11/16/2016 Electronic Signature(s) Signed: 01/03/2018 8:32:41 AM By: Lenda Kelp PA-C Entered By: Lenda Kelp on 01/02/2018 08:20:03 Gelinas, Dennice Elbert Ewings ([id number]) -------------------------------------------------------------------------------- Progress Note Details Patient Name: [name] Date of Service: 01/02/2018 8:00 AM Medical Record Number: [id number] Patient Account Number: [id number] Date of Birth/Sex: [birth year] (56 y.o. Female) Treating RN: Ashok Cordia, Debi Primary Care Provider: Joen [name] Other Clinician: Referring Provider: Joen [name] Treating Provider/Extender: Linwood Dibbles, Rola Lennon Weeks in Treatment: 67 Subjective Chief Complaint Information obtained from Patient Patient is here for follow-up of a relation of her right ischial pressure ulcer History of Present  Illness (HPI) The following HPI elements were documented for the patient's wound: Location: bilateral gluteal ulcerations and left popliteal ulceration with tunneling Quality: adits to intermittent aching to ischial ulcers, no discomfort to sinus tract Severity: right iscial ulcer with increased depth Duration: chronic ulcers to bilateral ischial ulcers and sinus tract Timing: The pain is intermittent in severity as far as how intense it becomes but is present all the time. Manipulationn makes this worse. Context: The wound occurred when the patient had a fall and was unconscious for about 48 hours laying on the floor and she had pressure injury at that stage. Modifying Factors: Other treatment(s) tried include:as noted below she has been seen by visiting wound care physicians or nurse practitioners and details  have been noted Associated Signs and Symptoms: has yet to receive offloading cushions and mattress overlay 56 year old patient who was seen by visiting [name] wound care specialist for a wound on both her buttock and was found to have an unstageable wound on the right buttock for about 2 months. I understand that she had a fall and was laying on the floor for about 48 hours before she was found and taken to the ICU and had a long injury to her gluteal area from pressure and also had broken her right humerus. She has had a right proximal humerus fracture and has been followed up with orthopedics recently. The patient has a past medical history of type 2 diabetes mellitus, paraparesis, acute pyelonephritis, GERD, hypertension, glaucoma, chronic pain, anxiety neurosis, nicotine dependence, COPD. the patient had some debridement done and was to operative was recommended to use Silvadene dressing and offloading. She is a smoker and occasionally smokes a few cigarettes. the patient requested a second opinion for months and is here to discuss her care. 09/21/16; the patient re-presents from home  today for review of 3 different wounds. I note that she was seen in the clinic here in July at which time she had bilateral buttock wounds. It was apparently suggested at that time that she use a wound VAC bridged to both wounds just near the initial tuberosity's bilaterally which she refused. The history was a bit difficult to put together. Apparently this patient became ill at the end of April of this year. She was found sitting on the floor she had apparently been for 2 days and subsequently admitted to hospital from 04/23/16 through 05/02/16 and at that point she was critically ill ultimately having sepsis secondary to UTI, nontraumatic rhabdomyolysis and diabetic ketoacidosis. She had acute renal failure and I think required ICU care including intubation. Patient states her wounds actually started at that point on the bilateral issue tuberosities however in reviewing the discharge summary from 5/8 I see no reference to wounds at that point. It did state that she had left lower extremity cellulitis however. Reviewing Epic I see no relevant x-rays. It would appear that her discharge creatinine was within the normal range and indeed on 9/15 her creatinine has remained normal. [facility] There the wounds on her bilateral Buttocks were dressed. Only just before her discharge from the nursing facility she developed an "knot" which was interpreted as cellulitis on the posterior aspect of her left knee she was given antibiotics. Apparently sometime late in July a this actually opened and became a wound at home health care was tending to however she is still having purulent drainage coming from this and by my understanding the wound depth is actually become unmeasurable. I am not really clear about what home health has been placing in any of these wound areas. The patient states that is something with silver and it. She is not been Bubar, Kaira L.  ([id number]) systemically unwell no fever or chills her appetite is good. She is a diabetic poorly controlled however she states that her recent blood sugars at home have been in the low to mid 100s. 09/28/16 On evaluation today patient appears to continue to exhibit the 3 areas of ulceration that were noted previous. She did have an x-ray of the right pelvis which showed evidence of potential soft tissue infection but no obvious osteomyelitis. There was a discussion last office visit concerning the possibility of a wound VAC.  Witth that being said the x-ray report suggested that an MRI may be more appropriate to further evaluate the extent. Subsequently in regard to the wound over the popliteal portion of the left lower extremity with tunneling at 12:00 the CT scan that was ordered was denied by insurance as they state the patient has not had x-rays prior to advanced imaging. Patient states that she is frustrated with the situation overall. 10/05/16 in the interval since I last saw this patient last week she has had the x-ray of the knee performed. I did review that x-ray today and fortunately shows no evidence of osteomyelitis or other acute abnormality at this point in time. She continues to have the opening iin the posterior left popliteal space with tracking proximal up the posterior thigh. Nothing seems to have worsened but it also seems to have not improved. The same is true in regard to the right pressure ulcer over the gluteal region which extends toward the ischium. The left gluteal pressure ulcer actually appears to be doing somewhat better my opinion there is some necrotic slough but overall this appears fairly well. She tells me thatt she has some discomfort especially when home health is helping her with dressing changes as they do not know her. At worse she rates her pain to be a 5 out of 10 right now it's more like a 1 out of 10. 10/07/16; still the patient has 3 different wound areas.  She has a deep stage IV wound over her right ischial tuberosity. She is due to have an MRI next week. The wound over her left ischial tuberosity is more superficial and underwent debridement today. Finally she has a small open area in her left popliteal fossa the probes on measurably forward superiorly. Still a lot of drainage coming out of this. The CT scan that I ordered 3 weeks ago was questioned by her insurance company wanting a plain x-ray first. As I understand things result of this is nothing has been done in 3 weeks in terms of imaging the thigh and she has an MRI booked of this along with her pelvis for next week line 10/12/16; patient has a deep probing wound over the right ischiall tuberosity, stage III wound over the left visual tuberosity and a draining sinus in her left popliteal fossa. None of this much different from when I saw this 3 weeks ago. We have been using silver rope to the right ischial wound and a draining area in the left popliteal fossa. Plain silver alginate to the area on the left ischial tuberosity 10/19/16; the patient's wounds are essentially unchanged although the area on the left lower gluteal is actually improved. Our intake nurse noted drainage from the right initial tuberosity probing wound as well as the draining area in the left popliteal fossa. Both of these were cultured. She had x-rays I think at the insistence of her insurance company on 09/23/16 x-ray of the pelvis was not particularly helpful she did have soft tissue air over the right lower pelvis although with the depth of this wound this is not surprising. An x-ray of her left knee did not show any specific abnormalities. We are still using silver alginate to these wound areas. Her MRI is booked for 10/27 10/26/16; cultures of the purulent drainage in her right initial tuberosity wound grew moderate Proteus and few staph aureus. The same organisms were cultured out of the left knee sinus tract  posteriorly. The staph aureus is MRSA. I had started  her on Augmentin last week I added doxycycline. The MRI of the left lower extremity and pelvis was finally done. The MRI of the femur showed subcutaneous soft tissue swelling edema fluid and myositis in the vastus lateralis muscle but no soft tissue abscess septic arthritis or osteomyelitis. MRI of the pelvis showed the left wound to be more expensive extending down to the bone there was osteomyelitis. Left hamstring tendons were also involved. No septic arthritis involving the hip. The decubitus ulcer on the right side showed no definite osteomyelitis or abscess.. The right hip wound is actually the one the probes 6 cm downward. But the MRI showing infection including osteomyelitis on the left explains the draining sinus in the popliteal fossa on the left. She did have antibiotics in the hospitalization last time and this extended into her nursing home stay but I'm not exactly sure what antibiotics and for what duration. According the patient this did include vancomycin with considerable effort of our staff we are able to get the patient into see Dr. Sampson Goon today. There were transportation difficulties. Her mother had open heart surgery and is in the ICU in [place] therefore her brother was unable to transport. Dr. Jarrett Ables office graciously arranged time to see her today. From my point of view she is going to require IV vancomycin plus perhaps a third generation cephalosporin. I plan to keep her on doxycycline and Augmentin until the IV antibiotics can be arranged. 11/02/2016 - Tinya presents today for management of ulcers; She saw Dr. 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 She is to have the PICC line placed today and receive the initial dose for both antibiotics today. She has yet to receive the offloading chair cushion and/or mattress  overlay from home health, apparently this has been a 3 week process. I encouraged her to speak to home health regarding this matter, along with offering home health to contact the wouns care center with any questions or concerns. The left ischial pressure ulcer continues to imporve, while to right ischial ulcer has increased in depth. The popliteal fossa sinus tract remains unmeasurable due to the limitation of depth measurement (tract extends beyond our measuring devices). SYRIA, KESTNER ([id number]) 11-16-2016 Ms. Moreland presents today for evaluation and management of bilateral ischial stage IV pressure ulcers and sinus tract to the left popliteal fossa. she is under the care of Dr. Sampson Goon for IV antibiotic therapy; she states that the vancomycin was placed on hold and will be restarted at a lower dose based on her renal function. She continues taking Zosyn in addition to the vancomycin. She also states that she has yet to receive offloading cushions from home health, according to her she does not qualify for these offloading cushions because "the ulcers are unstageable ". We will contact the home health agency today to lend clarity regarding her pressure ulcers. The left popliteal fossa sinus tract continues to be a measurable as it extends beyond the length of our measuring devices.. 11/30/16; the patient is still on vancomycin and Zosyn. The depth of the draining sinus behind her left popliteal fossa is down to 4 cm although there is still serosanguineous drainage coming out of this. She saw Dr. Sampson Goon of infectious disease yesterday the idea is to weeks more of IV antibiotics and then oral antibiotics although I have not read his note. The area on the left gluteal fold is just about healed. She has a  6 cm draining sinus over the right initial tuberosity although I cannot feel bone at the base of this. As far as the patient is aware she has not had a recheck of her inflammatory  markers. 12/07/16; patient is on vancomycin and Zosyn appointment with Dr. Sampson Goon on the 19th at which point the patient expects to have a change in antibiotics. Remarkable improvement over the wound over the left ischial tuberosity which is just about closed. The draining sinus in her popliteal fossa has 0.4 cm in depth. The area on the right ischial tuberosity still probes down 7 cm. This is closed and overall wound dimensions but not depth. 12/14/16; patient is completing her vancomycin and Zosyn and per her she is going to transition to Bactrim and Augmentin for another 3 weeks. The area on her left gluteal fold is closed except for some skin tears. The area behind her left knee is no longer has any depth. The only remaining area that is of clinical concern is on the right gluteal fold probing towards the right ischial tuberosity. Today this measures 6.9 cm in depth. Very gritty surface 12/21/16; patient is now on Bactrim and Augmentin as directed by infectious disease. This should be for another 2 weeks. The area in her left gluteal fold and left popliteal are closed over and fully healed. Measurements today at 7 cm in the right buttock wound is unchanged from last week. 12/27/16; patient is on Bactrim and Augmentin for another week as directed by infectious disease. She has completed her IV antibiotics. She is not been systemically unwell no fever no chills. The area on her right buttock measured over 6 cm in depth. There is no palpable bone. No evidence of surrounding soft tissue infection. She is complaining of tongue irritation and has a history of thrush 01/04/17; patient is been back to see Dr. Sampson Goon, her Augmentin was stopped but he continued the Bactrim for another 3 weeks. Depth of the wound is 6.7 cm there is been no major change in either direction. She is not receive the wound VAC from home health I think because of confusion about who is supposed to provided will actually  talk to the home health agency today [[facility] The net no major change. 01/18/17; patient obtained her wound VAC about 10 days ago however for some reason it was not actually put on the wound. She is therefore here for [place] to apply this I guess. No other issues are noted. She is not complaining of pain fever drainage 02/01/17; patient is here now having the wound VAC or 3-4 weeks to a deep pressure area over the right initial tuberosity. This measures 6 cm in depth today which is about half a centimeter better than 2 weeks ago. There is no evidence she is systemically unwell no fever no chills no pain around the area. 02/15/17; I follow this patient every 2 weeks for a deep area over the right ischial tuberosity. This measures 5.5 cm today which is a continued improvement of 1.2 cm from 1/10 and down 0.5 cm from her visit 2 weeks ago 03/01/17; continued difficult area over the right initial tuberosity using the wound VAC. Depth today of 5.1 cm which is improved. Does not appear to be a lot of drainage in the canister. Antibiotics were finally stopped by Dr. Sampson Goon bactrim[]  and inflammatory markers have been repeated 03/15/17; fall this lady every 2 weeks for a difficult area over her lower right gluteal area/ischial tuberosity. Depth today at 4.6  cm. This is a slow but steady improvement in the depth of this wound. Although we have labeled this as a pressure ulcer there may have been an underlying infection here at one point before we saw her. She had osteomyelitis on the left extensively which is since resolved 03/29/17- patient is here for follow-up evaluation of her right ischial pressure ulcer. She continues with the wound VAC and home health. According to the nurse home health has been using less foams and appropriate and we will instruct accordingly. The patient continues to smoke, approximately 10 cigarettes a day. She has been advised to decrease that in half by her next appointment, with a  goal of complete cessation. She states her blood sugars have been consistently less than 150. She states that she spends most of her day position left lateral or prone. She does have an air mattress on her bed, she does not have an mattress for her chair, she states she cannot afford this. 04/12/17; patient is here for evaluation of her right ischial pressure ulcer. We continue to use a wound VAC with minimal improvement today the depth of this measuring 4.4 cm versus 4.6 cm 2 weeks ago. We are using a [employer] wound VAC on this area. There is not excessive drainage no pain. The patient tells me she tries to keep off this in bed but is up in the wheelchair for 2 hours a day. She is limited in no her overall ambulation but is improving and apparently is getting bilateral lower extremity braces which she hopes will improve her ability to walk independently. 05/10/17; Depth at 3.3cm. Improved 05/24/17; depth at 3.5 cm. This is not improved since last time. Not clear if they are using collagen under the foam 06/07/17; depth that 2.9 which is a slight improvement. Still using the wound VAC. Lassen, Cassiopeia L. ([id number]) 06/21/17; depth is 2.9 cm which is exactly the same as last time. Also the appearance of this wound is completely the same. We have been using silver collagen under a wound VAC. 07/05/17 patient presents today for reevaluation concerning her right Ischial wound. She had switched insurance companies and so it does appear that the Wound VAC needs to be reauthorized which we are working on this morning. Nonetheless her wound has been saying about the same we have continued to use the silver collagen underneath the wound VAC. She has no discomfort. 07/19/17; patient follows every 2 weeks for her right if she'll wound. Since I last saw this a month ago her dimensions of come down to 2.6 cm. This is slightly down from a month ago when it was 2.9 cm in 4 months ago it was 4.6 cm. I note that there were  insurance company issues with regard to the wound VAC which she apparently is not having on for several weeks now. I think it would be reasonable to change therapies here. Will use silver alginate rope 08/02/17 on evaluation today patient's wound actually appears to be doing significantly better in regard to the depth as compared to her previous evaluation. She is having no discomfort. Unfortunately she never got the Wound VAC reapproved following the insurance issues from several weeks ago. With that being said it does not appear that she needs to requires the Wound VAC anymore and in fact I feel that she is doing better and making greater improvements at this point without it. Fortunately she has no nausea, vomiting, diarrhea, fevers, or chills. 08/16/17; patient's wound depth down to 2.3  cm. She is using silver alginate packing rope. Note that her wound VAC was discontinued due to insurance issues. This is not particularly surprising. She has not been systemically unwell and has no other new complaints 08/30/17; no change in depth. Still at 2.3 cm. Still with the same gritty surface requiring debridement. I've been using silver alginate for quite a period of time although this came down nicely in the last month 10/04/17; the depth of this is 2 cm however with careful inspection under high-intensity light most of the walls of this small probing sinus seemed to be normally epithelialized. I cannot exactly see the base of this however there is been no drainage. We've been using silver alginate there is no drainage on the dressing when it is removed. 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 There is no evidence of surrounding infection or pain. The patient had many review weakness in her bilateral legs. She is already been to see a neurologist who according the patient told her "you would never walk again" 11/01/17; this is a patient I last saw a month ago. At  that point the linear tunnel in her right buttock was 2 cm. It was not possible to see the depth of this as skin had grown into the tunnel. In light of the fact that there was no drainage and no visible wound I recommended that we just allow her to go about her usual activity without dressing. She reports that almost immediately she noted drainage on her clothing although in spite of her instructions the contrary she did not come back to the clinic. She has not been specifically addressing this and is continued to no drainage without other symptoms either local or systemic 11/29/17; I follow this woman monthly. Last time the wound on her right buttock was 1.8 cm today measuring at 1.4 there has been gradual improvement in this. Concerning is the fact that the patient says that Dr. Sampson Goon of infectious disease changed her from doxycycline to Augmentin apparently because of the elevated inflammatory markers. She also said he did a culture of this area but she is not heard the results. I don't think I have his information available on care everywhere however I will check this. She is using Hydrofera Blue rope. The patient and her intake nurse report that she is still having identifiable drainage which certainly makes it clear that this is not closed. 01/02/17 on evaluation today patient appears to be doing fairly well in regard to her left gluteal wound. She has been continuing with the [facility] Dressing's here in our office. We actually see her once a month and then subsequently are performing weekly dressing changes with Hydrofera Blue Dressing rope during nurse visits one time a week. Today there really was not much drainage at this point. However this has happened previously where she had no drainage and was essentially thought to be close internally although the external had not completely pulled together. However then she reopened and began draining again. He has been mentioned the  possibility of her seeing plastic surgery to try to get this finally and completely healed. Patient History Information obtained from Patient. Family History Cancer - Mother, Diabetes - Father, Hypertension - Mother,Father, Stroke - Mother, No family history of Heart Disease, Kidney Disease, Lung Disease, Seizures, Thyroid Problems. Social History Former smoker, Marital Status - Divorced, Alcohol Use - Never, Drug Use - Prior History, Caffeine Use - Rarely. Medical History Grunden, MACHEL VIOLANTE. ([id number])  Hospitalization/Surgery History - 04/09/2016, [facility], AMS. Review of Systems (ROS) Constitutional Symptoms (General Health) Denies complaints or symptoms of Fever, Chills. Respiratory The patient has no complaints or symptoms. Cardiovascular The patient has no complaints or symptoms. Psychiatric The patient has no complaints or symptoms. Objective Constitutional Well-nourished and well-hydrated in no acute distress. Vitals Time Taken: 8:08 AM, Height: 63 in, Weight: 257 lbs, BMI: 45.5, Temperature: 98.1 F, Pulse: 86 bpm, Respiratory Rate: 18 breaths/min, Blood Pressure: 137/54 mmHg. Respiratory normal breathing without difficulty. clear to auscultation bilaterally. Cardiovascular regular rate and rhythm with normal S1, S2. Psychiatric this patient is able to make decisions and demonstrates good insight into disease process. Alert and Oriented x 3. pleasant and cooperative. General Notes: Patient's wound did not have any identifiable drainage on evaluation today. On probing the wound did not seem to be significantly smaller despite one months of care over the past several weeks. She was not having any pain. There is no evidence of infection. Integumentary (Hair, Skin) Wound #3 status is Open. Original cause of wound was Pressure Injury. The wound is located on the Right Gluteal fold. The wound measures 0.5cm length x 0.5cm width x 1.5cm depth; 0.196cm^2 area and 0.295cm^3 volume. There  is Fat Layer (Subcutaneous Tissue) Exposed exposed. There is no tunneling or undermining noted. There is a medium amount of serous drainage noted. The wound margin is distinct with the outline attached to the wound base. There is medium (34-66%) red granulation within the wound bed. There is a medium (34-66%) amount of necrotic tissue within the wound bed including Adherent Slough. The periwound skin appearance exhibited: Scarring, Dry/Scaly. The periwound skin appearance did not exhibit: Callus, Crepitus, Excoriation, Induration, Rash, Maceration, Atrophie Blanche, Cyanosis, Ecchymosis, Hemosiderin Staining, Mottled, Pallor, Rubor, Erythema. Periwound temperature was noted as No Abnormality. The periwound has tenderness on palpation. Pangallo, Reva L. 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 Anesthetic (add to Medication List): Wound #3 Right Gluteal fold: Topical Lidocaine 4% cream applied to wound bed prior to debridement (In Clinic Only). Skin Barriers/Peri-Wound Care: Wound #3 Right Gluteal fold: Skin Prep Primary Wound Dressing: Wound #3 Right Gluteal fold: Hydrafera Blue - rope Secondary Dressing: Wound #3 Right Gluteal fold: [place] Dressing Change Frequency: Wound #3 Right Gluteal fold: Change dressing every week Follow-up Appointments: Nurse Visit as needed Other: - Once a week nurse visit for dressing change unless scheduled to see MD. Off-Loading: Wound #3 Right Gluteal fold: Turn and reposition every 2 hours Additional Orders / Instructions: Wound #3 Right Gluteal fold: Increase protein intake. The following medication(s) was prescribed: lidocaine topical 4 % cream 1 1 cream topical Despite the fact that patient has made significant progress in regard to this  wound and it has gotten significantly smaller we seem to be having a difficult time getting this to completely close. She has been previously discussed with the possibility of seeing a Engineer, petroleum. I think at this point that getting a plastic surgeon opinion would be a good idea. I explained to her their opinion may be just to keep doing what we are doing and hope for a complete closure of the wound. However there may also be something they can do to aid in getting this to close since we seem to be having periods of time where this will see the epitheliazie within  the cavity and then subsequently will be open again. For that reason I think that it would be a good idea to get a plastic surgeons opinion at [facility] and patient is in agreement with the plan. She is just going to discuss with her transportation person to ensure that this is okay. Petrea, Grae L. ([id number]) Please see above for specific wound care orders. We will see patient for re-evaluation in 1 month here in the clinic. If anything worsens or changes patient will contact our office for additional recommendations. Electronic Signature(s) Signed: 01/03/2018 8:32:41 AM By: Lenda Kelp PA-C Entered By: Lenda Kelp on 01/02/2018 08:37:03 Budzynski, [name] ([id number]) -------------------------------------------------------------------------------- ROS/PFSH Details Patient Name: [name] Date of Service: 01/02/2018 8:00 AM Medical Record Number: [id number] Patient Account Number: [id number] Date of Birth/Sex: 1961-04-28 (56 y.o. Female) Treating RN: Ashok Cordia, Debi Primary Care Provider: Joen [name] Other Clinician: Referring Provider: Joen [name] Treating Provider/Extender: Linwood Dibbles, Ernisha Sorn Weeks in Treatment: 66 Information Obtained From Patient Wound History Do you currently have one or more open woundso Yes How many open wounds do you currently haveo 3 Approximately how long have you had your woundso since  April 2017 How have you been treating your wound(s) until nowo silver Has your wound(s) ever healed and then re-openedo No Have you had any lab work done in the past montho No Have you tested positive for an antibiotic resistant organism (MRSA, VRE)o No Have you tested positive for osteomyelitis (bone infection)o No Have you had any tests for circulation on your legso No Have you had other problems associated with your woundso Swelling Constitutional Symptoms (General Health) Complaints and Symptoms: Negative for: Fever; Chills Eyes Medical History: Negative for: Cataracts; Glaucoma; Optic Neuritis Ear/Nose/Mouth/Throat Medical History: Negative for: Chronic sinus problems/congestion; Middle ear problems Hematologic/Lymphatic Medical History: Negative for: Anemia; Hemophilia; Human Immunodeficiency Virus; Lymphedema; Sickle Cell Disease Respiratory Complaints and Symptoms: No Complaints or Symptoms Medical History: Positive for: Asthma Negative for: Aspiration; Chronic Obstructive Pulmonary Disease (COPD); Pneumothorax; Sleep Apnea; Tuberculosis Cardiovascular Complaints and Symptoms: No Complaints or Symptoms Medical History: Positive for: Hypertension Etzler, Deona L. ([id number]) Negative for: Angina; Arrhythmia; Congestive Heart Failure; Coronary Artery Disease; Deep Vein Thrombosis; Hypotension; Myocardial Infarction; Peripheral Arterial Disease; Peripheral Venous Disease; Phlebitis; Vasculitis Gastrointestinal Medical History: Negative for: Cirrhosis ; Colitis; Crohnos; Hepatitis A; Hepatitis B; Hepatitis C Endocrine Medical History: Positive for: Type II Diabetes Negative for: Type I Diabetes Time with diabetes: 30 yrs Treated with: Insulin Blood sugar tested every day: Yes Tested : Blood sugar testing results: Breakfast: 121 Genitourinary Medical History: Negative for: End Stage Renal Disease Immunological Medical History: Negative for: Lupus Erythematosus;  Raynaudos; Scleroderma Integumentary (Skin) Medical History: Negative for: History of Burn; History of pressure wounds Musculoskeletal Medical History: Negative for: Gout; Rheumatoid Arthritis; Osteoarthritis; Osteomyelitis Neurologic Medical History: Positive for: Neuropathy Negative for: Dementia; Quadriplegia; Paraplegia; Seizure Disorder Oncologic Medical History: Negative for: Received Chemotherapy; Received Radiation Psychiatric Complaints and Symptoms: No Complaints or Symptoms Medical History: Negative for: Anorexia/bulimia; Confinement Anxiety Alphin, Jennetta L. ([id number]) Immunizations Pneumococcal Vaccine: Received Pneumococcal Vaccination: No Implantable Devices Hospitalization / Surgery History Name of Hospital Purpose of Hospitalization/Surgery Date [facility] AMS 04/09/2016 Family and Social History Cancer: Yes - Mother; Diabetes: Yes - Father; Heart Disease: No; Hypertension: Yes - Mother,Father; Kidney Disease: No; Lung Disease: No; Seizures: No; Stroke: Yes - Mother; Thyroid Problems: No; Former smoker; Marital Status - Divorced; Alcohol Use: Never; Drug Use: Prior History; Caffeine Use: Rarely; Advanced Directives: No; Patient  does not want information on Advanced Directives; Living Will: No; Medical Power of Attorney: No Physician Affirmation I have reviewed and agree with the above information. Electronic Signature(s) Signed: 01/02/2018 2:57:40 PM By: Alejandro Mulling Signed: 01/03/2018 8:32:41 AM By: Lenda Kelp PA-C Entered By: Lenda Kelp on 01/02/2018 08:34:25 Muela, [name] ([id number]) -------------------------------------------------------------------------------- SuperBill Details Patient Name: [name] Date of Service: 01/02/2018 Medical Record Number: [id number] Patient Account Number: [id number] Date of Birth/Sex: [birth year] (56 y.o. Female) Treating RN: Ashok Cordia, Debi Primary Care Provider: Joen [name] Other Clinician: Referring Provider:  Joen [name] Treating Provider/Extender: Linwood Dibbles, Nellene Courtois Weeks in Treatment: 66 Diagnosis Coding ICD-10 Codes Code Description L89.314 Pressure ulcer of right buttock, stage 4 E11.42 Type 2 diabetes mellitus with diabetic polyneuropathy Facility Procedures CPT4 Code: [id number] Description: 99213 - WOUND CARE VISIT-LEV 3 EST PT Modifier: Quantity: 1 Physician Procedures CPT4 Code: [id number] Description: 99213 - WC PHYS LEVEL 3 - EST PT ICD-10 Diagnosis Description L89.314 Pressure ulcer of right buttock, stage 4 E11.42 Type 2 diabetes mellitus with diabetic polyneuropathy Modifier: Quantity: 1 Electronic Signature(s) Signed: 01/02/2018 1:40:55 PM By: Alejandro Mulling Signed: 01/03/2018 8:32:41 AM By: Lenda Kelp PA-C Entered By: Alejandro Mulling on 01/02/2018 13:40:54

## 2017-12-20 DIAGNOSIS — L89314 Pressure ulcer of right buttock, stage 4: Secondary | ICD-10-CM | POA: Diagnosis not present

## 2017-12-20 NOTE — Progress Notes (Signed)
[name], [name] ([id number]) Visit Report for 12/20/2017 Arrival Information Details Patient Name: [name] Date of Service: 12/20/2017 2:00 PM Medical Record Number: [id number] Patient Account Number: [id number] Date of Birth/Sex: 1961/02/15 (56 y.o. Female) Treating RN: Ashok Cordia, Debi Primary Care [name]: [name] Other Clinician: Referring [name]: [name] Treating Donte Kary/Extender: Altamese [place] in Treatment: 65 Visit Information History Since Last Visit All ordered tests and consults were completed: No Patient Arrived: Wheel Chair Added or deleted any medications: No Arrival Time: 14:11 Had a fall or experienced change in No Accompanied By: self activities of daily living that may affect Transfer Assistance: EasyPivot Patient risk of falls: Lift Signs or symptoms of abuse/neglect since last visito No Patient Identification Verified: Yes Hospitalized since last visit: No Secondary Verification Process Yes Has Dressing in Place as Prescribed: Yes Completed: Pain Present Now: No Patient Requires Transmission-Based No Precautions: Patient Has Alerts: Yes Patient Alerts: DM II Electronic Signature(s) Signed: 12/20/2017 4:38:32 PM By: Alejandro Mulling Entered By: Alejandro Mulling on 12/20/2017 14:11:47 [name], [name] ([id number]) -------------------------------------------------------------------------------- Clinic Level of Care Assessment Details Patient Name: [name] Date of Service: 12/20/2017 2:00 PM Medical Record Number: [id number] Patient Account Number: [id number] Date of Birth/Sex: [birth year] (56 y.o. Female) Treating RN: Ashok Cordia, Debi Primary Care [name]: [name] Other Clinician: Referring [name]: [name] Treating [name]/Extender: Altamese [place] in Treatment: 65 Clinic Level of Care Assessment Items TOOL 4 Quantity Score X - Use when only an EandM is performed on FOLLOW-UP visit 1 0 ASSESSMENTS -  Nursing Assessment / Reassessment X - Reassessment of Co-morbidities (includes updates in patient status) 1 10 X- 1 5 Reassessment of Adherence to Treatment Plan ASSESSMENTS - Wound and Skin Assessment / Reassessment X - Simple Wound Assessment / Reassessment - one wound 1 5 []  - 0 Complex Wound Assessment / Reassessment - multiple wounds []  - 0 Dermatologic / Skin Assessment (not related to wound area) ASSESSMENTS - Focused Assessment []  - Circumferential Edema Measurements - multi extremities 0 []  - 0 Nutritional Assessment / Counseling / Intervention []  - 0 Lower Extremity Assessment (monofilament, tuning fork, pulses) []  - 0 Peripheral Arterial Disease Assessment (using hand held doppler) ASSESSMENTS - Ostomy and/or Continence Assessment and Care []  - Incontinence Assessment and Management 0 []  - 0 Ostomy Care Assessment and Management (repouching, etc.) PROCESS - Coordination of Care X - Simple Patient / Family Education for ongoing care 1 15 []  - 0 Complex (extensive) Patient / Family Education for ongoing care []  - 0 Staff obtains Chiropractor, Records, Test Results / Process Orders []  - 0 Staff telephones HHA, Nursing Homes / Clarify orders / etc []  - 0 Routine Transfer to another Facility (non-emergent condition) []  - 0 Routine Hospital Admission (non-emergent condition) []  - 0 New Admissions / Manufacturing engineer / Ordering NPWT, Apligraf, etc. []  - 0 Emergency Hospital Admission (emergent condition) X- 1 10 Simple Discharge Coordination [name], [name] ([id number]) []  - 0 Complex (extensive) Discharge Coordination PROCESS - Special Needs []  - Pediatric / Minor Patient Management 0 []  - 0 Isolation Patient Management []  - 0 Hearing / Language / Visual special needs []  - 0 Assessment of Community assistance (transportation, D/C planning, etc.) []  - 0 Additional assistance / Altered mentation []  - 0 Support Surface(s) Assessment (bed, cushion, seat,  etc.) INTERVENTIONS - Wound Cleansing / Measurement X - Simple Wound Cleansing - one wound 1 5 []  - 0 Complex Wound Cleansing - multiple wounds X- 1 5 Wound Imaging (photographs -  any number of wounds) []  - 0 Wound Tracing (instead of photographs) X- 1 5 Simple Wound Measurement - one wound []  - 0 Complex Wound Measurement - multiple wounds INTERVENTIONS - Wound Dressings X - Small Wound Dressing one or multiple wounds 1 10 []  - 0 Medium Wound Dressing one or multiple wounds []  - 0 Large Wound Dressing one or multiple wounds X- 1 5 Application of Medications - topical []  - 0 Application of Medications - injection INTERVENTIONS - Miscellaneous []  - External ear exam 0 []  - 0 Specimen Collection (cultures, biopsies, blood, body fluids, etc.) []  - 0 Specimen(s) / Culture(s) sent or taken to Lab for analysis []  - 0 Patient Transfer (multiple staff / / Similar devices) []  - 0 Simple Staple / Suture removal (25 or less) []  - 0 Complex Staple / Suture removal (26 or more) []  - 0 Hypo / Hyperglycemic Management (close monitor of Blood Glucose) []  - 0 Ankle / Brachial Index (ABI) - do not check if billed separately []  - 0 Vital Signs [name], [name] ( ) Has the patient been seen at the hospital within the last three years: Yes Total Score: 75 Level Of Care: New/Established - Level 2 Electronic Signature(s) Signed: 12/20/2017 4:38:32 PM By: Entered By: on 12/20/2017 14:13:42 [name], ( ) -------------------------------------------------------------------------------- Encounter Discharge Information Details Patient Name: Date of Service: 12/20/2017 2:00 PM Medical Record Number: Patient Account Number: Date of Birth/Sex: 1961/05/10 (56 y.o. Female) Treating RN: , Debi Primary Care Marlen Mollica: [id number] Other Clinician: Referring Honor Frison: [name] Treating  [name]/Extender: Alejandro Mulling in Treatment: 10 Encounter Discharge Information Items Discharge Pain Level: 0 Discharge Condition: Stable Ambulatory Status: Wheelchair Discharge Destination: Home Private Transportation: Auto Accompanied By: self Schedule Follow-up Appointment: Yes Medication Reconciliation completed and No provided to Patient/Care Jacksen Isip: Clinical Summary of Care: Electronic Signature(s) Signed: 12/20/2017 4:38:32 PM By: [name] Entered By: [name] on 12/20/2017 14:12:40 [name], [name] ([id number]) -------------------------------------------------------------------------------- Patient/Caregiver Education Details Patient Name: [id number] Date of Service: 12/20/2017 2:00 PM Medical Record Number: [id number] Patient Account Number: Ashok Cordia Date of Birth/Gender: 11/07/1961 (56 y.o. Female) Treating RN: Altamese [place], Debi Primary Care Physician: 76 Other Clinician: Referring Physician: 12/22/2017 Treating Physician/Extender: Alejandro Mulling in Treatment: 47 Education Assessment Education Provided To: Patient Education Topics Provided Wound/Skin Impairment: Handouts: Caring for Your Ulcer, Other: change dressing as ordered Methods: Demonstration, Explain/Verbal Responses: State content correctly Electronic Signature(s) Signed: 12/20/2017 4:38:32 PM By: [name] Entered By: [id number] on 12/20/2017 14:12:26 [name], [name] ([name]) -------------------------------------------------------------------------------- Wound Assessment Details Patient Name: [name] Date of Service: 12/20/2017 2:00 PM Medical Record Number: [name] Patient Account Number: [id number] Date of Birth/Sex: August 09, 1961 (56 y.o. Female) Treating RN: [name], Debi Primary Care Adiba Fargnoli: Altamese [place] Other Clinician: Referring Jamerson Vonbargen: 76 Treating Sharian Delia/Extender: [name] Weeks in  Treatment: 65 Wound Status Wound Number: 3 Primary Pressure Ulcer Etiology: Wound Location: Right Gluteal fold Wound Status: Open Wounding Event: Pressure Injury Comorbid Asthma, Hypertension, Type II Diabetes, Date Acquired: 04/21/2016 History: Neuropathy Weeks Of Treatment: 65 Clustered Wound: No Photos Photo Uploaded By: Alejandro Mulling on 12/20/2017 14:44:43 Wound Measurements Length: (cm) 0.2 Width: (cm) 0.8 Depth: (cm) 1.5 Area: (cm) 0.126 Volume: (cm) 0.188 % Reduction in Area: 95.9% % Reduction in Volume: 98.9% Epithelialization: Small (1-33%) Wound Description Classification: Category/Stage IV Wound Margin: Distinct, outline attached Exudate Amount: Large Exudate Type: Serous Exudate Color: amber Foul  Odor After Cleansing: No Slough/Fibrino Yes Wound Bed Granulation Amount: Medium (34-66%) Exposed Structure Granulation Quality: Red Fat Layer (Subcutaneous Tissue) Exposed: Yes Necrotic Amount: Medium (34-66%) Necrotic Quality: Adherent Slough Periwound Skin Texture Texture Color No Abnormalities Noted: No No Abnormalities Noted: No Callus: No Atrophie Blanche: No [name], [name] ([id number]) Crepitus: No Cyanosis: No Excoriation: No Ecchymosis: No Induration: No Erythema: No Rash: No Hemosiderin Staining: No Scarring: Yes Mottled: No Pallor: No Moisture Rubor: No No Abnormalities Noted: No Dry / Scaly: Yes Temperature / Pain Maceration: No Temperature: No Abnormality Tenderness on Palpation: Yes Wound Preparation Ulcer Cleansing: Rinsed/Irrigated with Saline Topical Anesthetic Applied: None Treatment Notes Wound #3 (Right Gluteal fold) 1. Cleansed with: Clean wound with Normal Saline 2. Anesthetic Topical Lidocaine 4% cream to wound bed prior to debridement 3. Peri-wound Care: Skin Prep 4. Dressing Applied: Hydrafera Blue 5. Secondary Dressing Applied [place] Notes hydrafera blue rope Electronic Signature(s) Signed:  12/20/2017 4:38:32 PM By: Alejandro Mulling Entered By: Alejandro Mulling on 12/20/2017 14:22:19

## 2017-12-28 ENCOUNTER — Encounter: Payer: BLUE CROSS/BLUE SHIELD | Attending: Physician Assistant

## 2017-12-28 DIAGNOSIS — F17210 Nicotine dependence, cigarettes, uncomplicated: Secondary | ICD-10-CM | POA: Insufficient documentation

## 2017-12-28 DIAGNOSIS — J449 Chronic obstructive pulmonary disease, unspecified: Secondary | ICD-10-CM | POA: Insufficient documentation

## 2017-12-28 DIAGNOSIS — E1142 Type 2 diabetes mellitus with diabetic polyneuropathy: Secondary | ICD-10-CM | POA: Insufficient documentation

## 2017-12-28 DIAGNOSIS — L89314 Pressure ulcer of right buttock, stage 4: Secondary | ICD-10-CM | POA: Insufficient documentation

## 2017-12-28 DIAGNOSIS — F411 Generalized anxiety disorder: Secondary | ICD-10-CM | POA: Insufficient documentation

## 2017-12-28 DIAGNOSIS — K219 Gastro-esophageal reflux disease without esophagitis: Secondary | ICD-10-CM | POA: Insufficient documentation

## 2017-12-28 DIAGNOSIS — I10 Essential (primary) hypertension: Secondary | ICD-10-CM | POA: Insufficient documentation

## 2017-12-29 NOTE — Progress Notes (Signed)
[name], [name] ([id number]) Visit Report for 12/28/2017 Arrival Information Details Patient Name: [name], [name] Date of Service: 12/28/2017 1:30 PM Medical Record Number: [id number] Patient Account Number: [id number] Date of Birth/Sex: 1961-06-15 (57 y.o. Female) Treating RN: [name] Primary Care [name]: [name] Other Clinician: Referring [name]: [name] Treating [name]/Extender: [name], [name] in Treatment: 65 Visit Information History Since Last Visit All ordered tests and consults were completed: No Patient Arrived: Wheel Chair Added or deleted any medications: No Arrival Time: 13:29 Any new allergies or adverse reactions: No Accompanied By: self Had a fall or experienced change in No Transfer Assistance: EasyP[employer] Patient activities of daily living that may affect Lift risk of falls: Patient Identification Verified: Yes Signs or symptoms of abuse/neglect since last visito No Secondary Verification Process Yes Hospitalized since last visit: No Completed: Has Dressing in Place as Prescribed: Yes Patient Requires Transmission-Based No Precautions: Pain Present Now: No Patient Has Alerts: Yes Patient Alerts: DM II Electronic Signature(s) Signed: 12/28/2017 4:31:09 PM By: [name] Entered By: [name] on 12/28/2017 13:31:38 [name] ([id number]) -------------------------------------------------------------------------------- Clinic Level of Care Assessment Details Patient Name: [name] Date of Service: 12/28/2017 1:30 PM Medical Record Number: [id number] Patient Account Number: [id number] Date of Birth/Sex: Sep 15, 1961 (57 y.o. Female) Treating RN: [name] Primary Care Viktor Philipp: [name] Other Clinician: Referring [name]: [name] Treating [name]/Extender: [name], [name] in Treatment: 66 Clinic Level of Care Assessment Items TOOL 4 Quantity Score X - Use when only an EandM is performed on  FOLLOW-UP visit 1 0 ASSESSMENTS - Nursing Assessment / Reassessment X - Reassessment of Co-morbidities (includes updates in patient status) 1 10 X- 1 5 Reassessment of Adherence to Treatment Plan ASSESSMENTS - Wound and Skin Assessment / Reassessment X - Simple Wound Assessment / Reassessment - one wound 1 5 []  - 0 Complex Wound Assessment / Reassessment - multiple wounds []  - 0 Dermatologic / Skin Assessment (not related to wound area) ASSESSMENTS - Focused Assessment []  - Circumferential Edema Measurements - multi extremities 0 []  - 0 Nutritional Assessment / Counseling / Intervention []  - 0 Lower Extremity Assessment (monofilament, tuning fork, pulses) []  - 0 Peripheral Arterial Disease Assessment (using hand held doppler) ASSESSMENTS - Ostomy and/or Continence Assessment and Care []  - Incontinence Assessment and Management 0 []  - 0 Ostomy Care Assessment and Management (repouching, etc.) PROCESS - Coordination of Care X - Simple Patient / Family Education for ongoing care 1 15 []  - 0 Complex (extensive) Patient / Family Education for ongoing care []  - 0 Staff obtains , Records, Test Results / Process Orders []  - 0 Staff telephones HHA, Nursing Homes / Clarify orders / etc []  - 0 Routine Transfer to another Facility (non-emergent condition) []  - 0 Routine Hospital Admission (non-emergent condition) []  - 0 New Admissions / / Ordering NPWT, Apligraf, etc. []  - 0 Emergency Hospital Admission (emergent condition) X- 1 10 Simple Discharge Coordination Sroka, [name] ( ) []  - 0 Complex (extensive) Discharge Coordination PROCESS - Special Needs []  - Pediatric / Minor Patient Management 0 []  - 0 Isolation Patient Management []  - 0 Hearing / Language / Visual special needs []  - 0 Assessment of Community assistance (transportation, D/C planning, etc.) []  - 0 Additional assistance / Altered mentation []  - 0 Support Surface(s)  Assessment (bed, cushion, seat, etc.) INTERVENTIONS - Wound Cleansing / Measurement X - Simple Wound Cleansing - one wound 1 5 []  - 0 Complex Wound Cleansing - multiple  wounds X- 1 5 Wound Imaging (photographs - any number of wounds) []  - 0 Wound Tracing (instead of photographs) X- 1 5 Simple Wound Measurement - one wound []  - 0 Complex Wound Measurement - multiple wounds INTERVENTIONS - Wound Dressings X - Small Wound Dressing one or multiple wounds 1 10 []  - 0 Medium Wound Dressing one or multiple wounds []  - 0 Large Wound Dressing one or multiple wounds []  - 0 Application of Medications - topical []  - 0 Application of Medications - injection INTERVENTIONS - Miscellaneous []  - External ear exam 0 []  - 0 Specimen Collection (cultures, biopsies, blood, body fluids, etc.) []  - 0 Specimen(s) / Culture(s) sent or taken to Lab for analysis []  - 0 Patient Transfer (multiple staff / Nurse, adult / Similar devices) []  - 0 Simple Staple / Suture removal (25 or less) []  - 0 Complex Staple / Suture removal (26 or more) []  - 0 Hypo / Hyperglycemic Management (close monitor of Blood Glucose) []  - 0 Ankle / Brachial Index (ABI) - do not check if billed separately X- 1 5 Vital Signs Choi, [name] ([id number]) Has the patient been seen at the hospital within the last three years: Yes Total Score: 75 Level Of Care: New/Established - Level 2 Electronic Signature(s) Signed: 12/28/2017 4:31:09 PM By: [name] Entered By: [name] on 12/28/2017 13:33:30 [name] ([id number]) -------------------------------------------------------------------------------- Encounter Discharge Information Details Patient Name: [name] Date of Service: 12/28/2017 1:30 PM Medical Record Number: [id number] Patient Account Number: [id number] Date of Birth/Sex: [birth year] (57 y.o. Female) Treating RN: [name] Primary Care [name]: [name] Other Clinician: Referring  [name]: [name] Treating Estephanie Hubbs/Extender: [name], [name] in Treatment: 76 Encounter Discharge Information Items Discharge Pain Level: 0 Discharge Condition: Stable Ambulatory Status: Wheelchair Discharge Destination: Home Private Transportation: Auto Accompanied By: self Schedule Follow-up Appointment: Yes Medication Reconciliation completed and No provided to Patient/Care Nancyann Cotterman: Clinical Summary of Care: Electronic Signature(s) Signed: 12/28/2017 4:31:09 PM By: [name] Entered By: [name] on 12/28/2017 13:32:52 Egley, [name] ([id number]) -------------------------------------------------------------------------------- Patient/Caregiver Education Details Patient Name: [name] Date of Service: 12/28/2017 1:30 PM Medical Record Number: [id number] Patient Account Number: [id number] Date of Birth/Gender: 1961-05-25 (57 y.o. Female) Treating RN: [name] Primary Care Physician: [name] Other Clinician: Referring Physician: Joen [name] Treating Physician/Extender: Skeet Simmer in Treatment: 49 Education Assessment Education Provided To: Patient Education Topics Provided Wound/Skin Impairment: Handouts: Caring for Your Ulcer, Other: change dressing as ordered Methods: Demonstration, Explain/Verbal Responses: State content correctly Electronic Signature(s) Signed: 12/28/2017 4:31:09 PM By: [name] Entered By: [name] on 12/28/2017 13:32:33 Mcanelly, Aylani Elbert Ewings ([id number]) -------------------------------------------------------------------------------- Wound Assessment Details Patient Name: [name] Date of Service: 12/28/2017 1:30 PM Medical Record Number: [id number] Patient Account Number: [id number] Date of Birth/Sex: [birth year] (57 y.o. Female) Treating RN: [name] Primary Care Audrey Thull: [name] Other Clinician: Referring Midori Dado: [name] Treating Joselynne Killam/Extender: [name],  [name] in Treatment: 66 Wound Status Wound Number: 3 Primary Pressure Ulcer Etiology: Wound Location: Right Gluteal fold Wound Status: Open Wounding Event: Pressure Injury Comorbid Asthma, Hypertension, Type II Diabetes, Date Acquired: 04/21/2016 History: Neuropathy [name] Of Treatment: 66 Clustered Wound: No Photos Photo Uploaded By: [name] on 12/28/2017 15:27:42 Wound Measurements Length: (cm) 0.2 Width: (cm) 0.8 Depth: (cm) 1.5 Area: (cm) 0.126 Volume: (cm) 0.188 % Reduction in Area: 95.9% % Reduction in Volume: 98.9% Epithelialization: Small (1-33%) Tunneling: No Undermining: No Wound Description Classification: Category/Stage IV Wound Margin: Distinct,  outline attached Exudate Amount: Large Exudate Type: Serous Exudate Color: amber Foul Odor After Cleansing: No Slough/Fibrino Yes Wound Bed Granulation Amount: Medium (34-66%) Exposed Structure Granulation Quality: Red Fat Layer (Subcutaneous Tissue) Exposed: Yes Necrotic Amount: Medium (34-66%) Necrotic Quality: Adherent Slough Periwound Skin Texture Texture Color No Abnormalities Noted: No No Abnormalities Noted: No Callus: No Atrophie Blanche: No Lange, Yuette L. ([id number]) Crepitus: No Cyanosis: No Excoriation: No Ecchymosis: No Induration: No Erythema: No Rash: No Hemosiderin Staining: No Scarring: Yes Mottled: No Pallor: No Moisture Rubor: No No Abnormalities Noted: No Dry / Scaly: Yes Temperature / Pain Maceration: No Temperature: No Abnormality Tenderness on Palpation: Yes Wound Preparation Ulcer Cleansing: Rinsed/Irrigated with Saline Topical Anesthetic Applied: None Treatment Notes Wound #3 (Right Gluteal fold) 1. Cleansed with: Clean wound with Normal Saline 3. Peri-wound Care: Skin Prep 4. Dressing Applied: Hydrafera Blue 5. Secondary Dressing Applied Dry Gauze [place] Notes hydrafera blue rope Electronic Signature(s) Signed: 12/28/2017 4:31:09 PM By:  [name] Entered By: [name] on 12/28/2017 13:38:43

## 2018-01-02 ENCOUNTER — Encounter: Payer: BLUE CROSS/BLUE SHIELD | Admitting: Physician Assistant

## 2018-01-02 DIAGNOSIS — L89314 Pressure ulcer of right buttock, stage 4: Secondary | ICD-10-CM | POA: Diagnosis not present

## 2018-01-03 NOTE — Progress Notes (Signed)
PAW, KARSTENS ([id number]) Visit Report for 01/02/2018 Arrival Information Details Patient Name: BETZY, BARBIER Date of Service: 01/02/2018 8:00 AM Medical Record Number: [id number] Patient Account Number: [id number] Date of Birth/Sex: Aug 20, 1961 (57 y.o. Female) Treating RN: Carolyne Fiscal, Debi Primary Care Nikolaj Geraghty: Lavera Guise Other Clinician: Referring Marquan Vokes: Lavera Guise Treating Shirle Provencal/Extender: Melburn Hake, HOYT Weeks in Treatment: 57 Visit Information History Since Last Visit All ordered tests and consults were completed: No Patient Arrived: Wheel Chair Added or deleted any medications: No Arrival Time: 08:05 Any new allergies or adverse reactions: No Accompanied By: self Had a fall or experienced change in No Transfer Assistance: EasyPivot Patient activities of daily living that may affect Lift risk of falls: Patient Identification Verified: Yes Signs or symptoms of abuse/neglect since last visito No Secondary Verification Process Yes Hospitalized since last visit: No Completed: Has Dressing in Place as Prescribed: Yes Patient Requires Transmission-Based No Precautions: Pain Present Now: No Patient Has Alerts: Yes Patient Alerts: DM II Electronic Signature(s) Signed: 01/02/2018 2:57:40 PM By: Alric Quan Entered By: Alric Quan on 01/02/2018 08:08:15 Doutt, Kynslie Carlean Jews ([id number]) -------------------------------------------------------------------------------- Clinic Level of Care Assessment Details Patient Name: [name] Date of Service: 01/02/2018 8:00 AM Medical Record Number: [id number] Patient Account Number: [id number] Date of Birth/Sex: [birth year] (56 y.o. Female) Treating RN: Carolyne Fiscal, Debi Primary Care Chester Sibert: Lavera Guise Other Clinician: Referring Jakeel Starliper: Lavera Guise Treating Briar Witherspoon/Extender: Melburn Hake, HOYT Weeks in Treatment: 66 Clinic Level of Care Assessment Items TOOL 4 Quantity Score X - Use when only an EandM is performed on  FOLLOW-UP visit 1 0 ASSESSMENTS - Nursing Assessment / Reassessment X - Reassessment of Co-morbidities (includes updates in patient status) 1 10 X- 1 5 Reassessment of Adherence to Treatment Plan ASSESSMENTS - Wound and Skin Assessment / Reassessment X - Simple Wound Assessment / Reassessment - one wound 1 5 []  - 0 Complex Wound Assessment / Reassessment - multiple wounds []  - 0 Dermatologic / Skin Assessment (not related to wound area) ASSESSMENTS - Focused Assessment []  - Circumferential Edema Measurements - multi extremities 0 []  - 0 Nutritional Assessment / Counseling / Intervention []  - 0 Lower Extremity Assessment (monofilament, tuning fork, pulses) []  - 0 Peripheral Arterial Disease Assessment (using hand held doppler) ASSESSMENTS - Ostomy and/or Continence Assessment and Care []  - Incontinence Assessment and Management 0 []  - 0 Ostomy Care Assessment and Management (repouching, etc.) PROCESS - Coordination of Care X - Simple Patient / Family Education for ongoing care 1 15 []  - 0 Complex (extensive) Patient / Family Education for ongoing care []  - 0 Staff obtains Programmer, systems, Records, Test Results / Process Orders []  - 0 Staff telephones HHA, Nursing Homes / Clarify orders / etc []  - 0 Routine Transfer to another Facility (non-emergent condition) []  - 0 Routine Hospital Admission (non-emergent condition) []  - 0 New Admissions / Biomedical engineer / Ordering NPWT, Apligraf, etc. []  - 0 Emergency Hospital Admission (emergent condition) X- 1 10 Simple Discharge Coordination Dunnigan, Enrika L. ([id number]) []  - 0 Complex (extensive) Discharge Coordination PROCESS - Special Needs []  - Pediatric / Minor Patient Management 0 []  - 0 Isolation Patient Management []  - 0 Hearing / Language / Visual special needs []  - 0 Assessment of Community assistance (transportation, D/C planning, etc.) []  - 0 Additional assistance / Altered mentation []  - 0 Support Surface(s)  Assessment (bed, cushion, seat, etc.) INTERVENTIONS - Wound Cleansing / Measurement X - Simple Wound Cleansing - one wound 1 5 []  - 0 Complex Wound Cleansing - multiple  wounds X- 1 5 Wound Imaging (photographs - any number of wounds) []  - 0 Wound Tracing (instead of photographs) X- 1 5 Simple Wound Measurement - one wound []  - 0 Complex Wound Measurement - multiple wounds INTERVENTIONS - Wound Dressings X - Small Wound Dressing one or multiple wounds 1 10 []  - 0 Medium Wound Dressing one or multiple wounds []  - 0 Large Wound Dressing one or multiple wounds X- 1 5 Application of Medications - topical []  - 0 Application of Medications - injection INTERVENTIONS - Miscellaneous []  - External ear exam 0 []  - 0 Specimen Collection (cultures, biopsies, blood, body fluids, etc.) []  - 0 Specimen(s) / Culture(s) sent or taken to Lab for analysis []  - 0 Patient Transfer (multiple staff / Civil Service fast streamer / Similar devices) []  - 0 Simple Staple / Suture removal (25 or less) []  - 0 Complex Staple / Suture removal (26 or more) []  - 0 Hypo / Hyperglycemic Management (close monitor of Blood Glucose) []  - 0 Ankle / Brachial Index (ABI) - do not check if billed separately X- 1 5 Vital Signs Vivar, Velva L. ([id number]) Has the patient been seen at the hospital within the last three years: Yes Total Score: 80 Level Of Care: New/Established - Level 3 Electronic Signature(s) Signed: 01/02/2018 2:57:40 PM By: Alric Quan Entered By: Alric Quan on 01/02/2018 13:40:46 [place], [name] ([id number]) -------------------------------------------------------------------------------- Encounter Discharge Information Details Patient Name: [name] Date of Service: 01/02/2018 8:00 AM Medical Record Number: [id number] Patient Account Number: [id number] Date of Birth/Sex: 1961-05-16 (57 y.o. Female) Treating RN: Carolyne Fiscal, Debi Primary Care Kymir Coles: Lavera Guise Other Clinician: Referring  Monie Shere: Lavera Guise Treating Sid Greener/Extender: Melburn Hake, HOYT Weeks in Treatment: 79 Encounter Discharge Information Items Discharge Pain Level: 0 Discharge Condition: Stable Ambulatory Status: Wheelchair Discharge Destination: Home Transportation: Other Accompanied By: self Schedule Follow-up Appointment: Yes Medication Reconciliation completed and No provided to Patient/Care Najee Manninen: Patient Clinical Summary of Care: Declined Electronic Signature(s) Signed: 01/03/2018 9:09:08 AM By: Ruthine Dose Entered By: Ruthine Dose on 01/02/2018 08:39:02 [place]um, [name] ([id number]) -------------------------------------------------------------------------------- Lower Extremity Assessment Details Patient Name: [name] Date of Service: 01/02/2018 8:00 AM Medical Record Number: [id number] Patient Account Number: [id number] Date of Birth/Sex: 14-Sep-1961 (56 y.o. Female) Treating RN: Ahmed Prima Primary Care Clotilde Loth: Lavera Guise Other Clinician: Referring Tiwana Chavis: Lavera Guise Treating Vanity Larsson/Extender: Melburn Hake, HOYT Weeks in Treatment: 66 Electronic Signature(s) Signed: 01/02/2018 2:57:40 PM By: Alric Quan Entered By: Alric Quan on 01/02/2018 08:17:43 Nadel, [place] ([id number]) -------------------------------------------------------------------------------- Multi Wound Chart Details Patient Name: [name] Date of Service: 01/02/2018 8:00 AM Medical Record Number: [id number] Patient Account Number: [id number] Date of Birth/Sex: 11/02/1961 (57 y.o. Female) Treating RN: Carolyne Fiscal, Debi Primary Care Lyah Millirons: Lavera Guise Other Clinician: Referring Ivoree Felmlee: Lavera Guise Treating Nastassia Bazaldua/Extender: Melburn Hake, HOYT Weeks in Treatment: 12 Vital Signs Height(in): 63 Pulse(bpm): 66 Weight(lbs): 257 Blood Pressure(mmHg): 137/54 Body Mass Index(BMI): 46 Temperature(F): 98.1 Respiratory Rate 18 (breaths/min): Photos: [3:No Photos] [N/A:N/A] Wound  Location: [3:Right Gluteal fold] [N/A:N/A] Wounding Event: [3:Pressure Injury] [N/A:N/A] Primary Etiology: [3:Pressure Ulcer] [N/A:N/A] Comorbid History: [3:Asthma, Hypertension, Type II Diabetes, Neuropathy] [N/A:N/A] Date Acquired: [3:04/21/2016] [N/A:N/A] Weeks of Treatment: [3:66] [N/A:N/A] Wound Status: [3:Open] [N/A:N/A] Measurements L x W x D [3:0.5x0.5x1.5] [N/A:N/A] (cm) Area (cm) : [3:0.196] [N/A:N/A] Volume (cm) : [3:0.295] [N/A:N/A] % Reduction in Area: [3:93.60%] [N/A:N/A] % Reduction in Volume: [3:98.30%] [N/A:N/A] Classification: [3:Category/Stage IV] [N/A:N/A] Exudate Amount: [3:Medium] [N/A:N/A] Exudate Type: [3:Serous] [N/A:N/A] Exudate Color: [3:amber] [N/A:N/A] Wound Margin: [3:Distinct, outline attached] [  N/A:N/A] Granulation Amount: [3:Medium (34-66%)] [N/A:N/A] Granulation Quality: [3:Red] [N/A:N/A] Necrotic Amount: [3:Medium (34-66%)] [N/A:N/A] Exposed Structures: [3:Fat Layer (Subcutaneous Tissue) Exposed: Yes] [N/A:N/A] Epithelialization: [3:Small (1-33%)] [N/A:N/A] Periwound Skin Texture: [3:Scarring: Yes Excoriation: No Induration: No Callus: No Crepitus: No Rash: No] [N/A:N/A] Periwound Skin Moisture: [3:Dry/Scaly: Yes Maceration: No] [N/A:N/A] Periwound Skin Color: [3:Atrophie Blanche: No Cyanosis: No Ecchymosis: No] [N/A:N/A] Erythema: No Hemosiderin Staining: No Mottled: No Pallor: No Rubor: No Temperature: No Abnormality N/A N/A Tenderness on Palpation: Yes N/A N/A Wound Preparation: Ulcer Cleansing: N/A N/A Rinsed/Irrigated with Saline Topical Anesthetic Applied: Other: lidocaine 4% Treatment Notes Electronic Signature(s) Signed: 01/02/2018 2:57:40 PM By: Alric Quan Entered By: Alric Quan on 01/02/2018 08:21:57 [place], [name] ([id number]) -------------------------------------------------------------------------------- [place] Details Patient Name: [name] Date of Service: 01/02/2018 8:00  AM Medical Record Number: [id number] Patient Account Number: [id number] Date of Birth/Sex: 01/27/1961 (57 y.o. Female) Treating RN: Carolyne Fiscal, Debi Primary Care Kareem Cathey: Lavera Guise Other Clinician: Referring Precious Segall: Lavera Guise Treating Brannon Decaire/Extender: Melburn Hake, HOYT Weeks in Treatment: 57 Active Inactive ` Abuse / Safety / Falls / Self Care Management Nursing Diagnoses: Potential for falls Goals: Patient will remain injury free Date Initiated: 09/21/2016 Target Resolution Date: 03/25/2017 Goal Status: Active Interventions: Assess fall risk on admission and as needed Notes: ` Nutrition Nursing Diagnoses: Imbalanced nutrition Potential for alteratiion in Nutrition/Potential for imbalanced nutrition Goals: Patient/caregiver agrees to and verbalizes understanding of need to use nutritional supplements and/or vitamins as prescribed Date Initiated: 09/21/2016 Target Resolution Date: 03/25/2017 Goal Status: Active Patient/caregiver verbalizes understanding of need to maintain therapeutic glucose control per primary care physician Date Initiated: 09/21/2016 Target Resolution Date: 03/25/2017 Goal Status: Active Interventions: Assess patient nutrition upon admission and as needed per policy Notes: ` Orientation to the Wound Care Program Nursing Diagnoses: Knowledge deficit related to the wound healing center program Goals: Patient/caregiver will verbalize understanding of the [place] ([id number]) Date Initiated: 09/21/2016 Target Resolution Date: 03/25/2017 Goal Status: Active Interventions: Provide education on orientation to the wound center Notes: ` Pain, Acute or Chronic Nursing Diagnoses: Pain, acute or chronic: actual or potential Potential alteration in comfort, pain Goals: Patient will verbalize adequate pain control and receive pain control interventions during procedures as needed Date Initiated: 09/21/2016 Target  Resolution Date: 03/25/2017 Goal Status: Active Patient/caregiver will verbalize adequate pain control between visits Date Initiated: 09/21/2016 Target Resolution Date: 03/25/2017 Goal Status: Active Patient/caregiver will verbalize comfort level met Date Initiated: 09/21/2016 Target Resolution Date: 03/25/2017 Goal Status: Active Interventions: Assess comfort goal upon admission Complete pain assessment as per visit requirements Notes: ` Wound/Skin Impairment Nursing Diagnoses: Impaired tissue integrity Goals: Ulcer/skin breakdown will have a volume reduction of 30% by week 4 Date Initiated: 09/21/2016 Target Resolution Date: 03/25/2017 Goal Status: Active Ulcer/skin breakdown will have a volume reduction of 50% by week 8 Date Initiated: 09/21/2016 Target Resolution Date: 03/25/2017 Goal Status: Active Ulcer/skin breakdown will have a volume reduction of 80% by week 12 Date Initiated: 09/21/2016 Target Resolution Date: 03/25/2017 Goal Status: Active Interventions: Assess ulceration(s) every visit Notes: SHANNAH, CONTEH ([id number]) Electronic Signature(s) Signed: 01/02/2018 2:57:40 PM By: Alric Quan Entered By: Alric Quan on 01/02/2018 08:17:51 [place], [name] ([id number]) -------------------------------------------------------------------------------- Pain Assessment Details Patient Name: [name] Date of Service: 01/02/2018 8:00 AM Medical Record Number: [id number] Patient Account Number: [id number] Date of Birth/Sex: [birth year] (57 y.o. Female) Treating RN: Carolyne Fiscal, Debi Primary Care Lilac Hoff: Lavera Guise Other Clinician: Referring Blakley Michna: Lavera Guise Treating Edward Guthmiller/Extender: Melburn Hake, HOYT  Weeks in Treatment: 66 Active Problems Location of Pain Severity and Description of Pain Patient Has Paino No Site Locations Pain Management and Medication Current Pain Management: Electronic Signature(s) Signed: 01/02/2018 2:57:40 PM By: Alric Quan Entered  By: Alric Quan on 01/02/2018 08:08:43 [place], [name] ([id number]) -------------------------------------------------------------------------------- Patient/Caregiver Education Details Patient Name: [name] Date of Service: 01/02/2018 8:00 AM Medical Record Number: [id number] Patient Account Number: [id number] Date of Birth/Gender: Dec 24, 1961 (57 y.o. Female) Treating RN: Carolyne Fiscal, Debi Primary Care Physician: Lavera Guise Other Clinician: Referring Physician: Lavera Guise Treating Physician/Extender: Sharalyn Ink in Treatment: 48 Education Assessment Education Provided To: Patient Education Topics Provided Wound/Skin Impairment: Handouts: Caring for Your Ulcer, Other: change dressing as ordered Methods: Demonstration, Explain/Verbal Responses: State content correctly Electronic Signature(s) Signed: 01/02/2018 2:57:40 PM By: Alric Quan Entered By: Alric Quan on 01/02/2018 08:25:17 Scripter, Armie L. ([id number]) -------------------------------------------------------------------------------- Wound Assessment Details Patient Name: [name] Date of Service: 01/02/2018 8:00 AM Medical Record Number: [id number] Patient Account Number: [id number] Date of Birth/Sex: 09-Nov-1961 (57 y.o. Female) Treating RN: Carolyne Fiscal, Debi Primary Care Pearley Baranek: Lavera Guise Other Clinician: Referring Etha Stambaugh: Lavera Guise Treating Orin Eberwein/Extender: Melburn Hake, HOYT Weeks in Treatment: 66 Wound Status Wound Number: 3 Primary Pressure Ulcer Etiology: Wound Location: Right Gluteal fold Wound Status: Open Wounding Event: Pressure Injury Comorbid Asthma, Hypertension, Type II Diabetes, Date Acquired: 04/21/2016 History: Neuropathy Weeks Of Treatment: 66 Clustered Wound: No Photos Photo Uploaded By: Alric Quan on 01/02/2018 14:45:25 Wound Measurements Length: (cm) 0.5 Width: (cm) 0.5 Depth: (cm) 1.5 Area: (cm) 0.196 Volume: (cm) 0.295 % Reduction in Area:  93.6% % Reduction in Volume: 98.3% Epithelialization: Small (1-33%) Tunneling: No Undermining: No Wound Description Classification: Category/Stage IV Wound Margin: Distinct, outline attached Exudate Amount: Medium Exudate Type: Serous Exudate Color: amber Foul Odor After Cleansing: No Slough/Fibrino Yes Wound Bed Granulation Amount: Medium (34-66%) Exposed Structure Granulation Quality: Red Fat Layer (Subcutaneous Tissue) Exposed: Yes Necrotic Amount: Medium (34-66%) Necrotic Quality: Adherent Slough Periwound Skin Texture Texture Color No Abnormalities Noted: No No Abnormalities Noted: No Callus: No Atrophie Blanche: No Hines, Tristyn L. ([id number]) Crepitus: No Cyanosis: No Excoriation: No Ecchymosis: No Induration: No Erythema: No Rash: No Hemosiderin Staining: No Scarring: Yes Mottled: No Pallor: No Moisture Rubor: No No Abnormalities Noted: No Dry / Scaly: Yes Temperature / Pain Maceration: No Temperature: No Abnormality Tenderness on Palpation: Yes Wound Preparation Ulcer Cleansing: Rinsed/Irrigated with Saline Topical Anesthetic Applied: Other: lidocaine 4%, Treatment Notes Wound #3 (Right Gluteal fold) 1. Cleansed with: Clean wound with Normal Saline 2. Anesthetic Topical Lidocaine 4% cream to wound bed prior to debridement 3. Peri-wound Care: Skin Prep 4. Dressing Applied: Hydrafera Blue 5. Secondary Reeves Notes hydrafera blue rope Electronic Signature(s) Signed: 01/02/2018 2:57:40 PM By: Alric Quan Entered By: Alric Quan on 01/02/2018 08:17:33 [place], [name] ([id number]) -------------------------------------------------------------------------------- [place] Details Patient Name: [name] Date of Service: 01/02/2018 8:00 AM Medical Record Number: [id number] Patient Account Number: [id number] Date of Birth/Sex: Jan 21, 1961 (56 y.o. Female) Treating RN: Carolyne Fiscal, Debi Primary Care Cam Harnden: Lavera Guise  Other Clinician: Referring Elizzie Westergard: Lavera Guise Treating Berline Semrad/Extender: Melburn Hake, HOYT Weeks in Treatment: 66 Vital Signs Time Taken: 08:08 Temperature (F): 98.1 Height (in): 63 Pulse (bpm): 86 Weight (lbs): 257 Respiratory Rate (breaths/min): 18 Body Mass Index (BMI): 45.5 Blood Pressure (mmHg): 137/54 Reference Range: 80 - 120 mg / dl Electronic Signature(s) Signed: 01/02/2018 2:57:40 PM By: Alric Quan Entered By: Alric Quan on 01/02/2018 08:10:46

## 2018-01-10 DIAGNOSIS — L89314 Pressure ulcer of right buttock, stage 4: Secondary | ICD-10-CM | POA: Diagnosis not present

## 2018-01-11 NOTE — Progress Notes (Signed)
LEYANA, WHIDDEN ([id number]) Visit Report for 01/10/2018 Arrival Information Details Patient Name: [name], [name] Date of Service: 01/10/2018 8:45 AM Medical Record Number: [id number] Patient Account Number: [id number] Date of Birth/Sex: [birth year] (57 y.o. Female) Treating RN: Huel Coventry Primary Care George Alcantar: Joen [name] Other Clinician: Referring Shalom Mcguiness: Joen [name] Treating Ayana Imhof/Extender: Altamese [place] in Treatment: 76 Visit Information History Since Last Visit Added or deleted any medications: No Patient Arrived: Wheel Chair Any new allergies or adverse reactions: No Arrival Time: 08:50 Had a fall or experienced change in No activities of daily living that may affect Accompanied By: self risk of falls: Transfer Assistance: None Signs or symptoms of abuse/neglect since last visito No Patient Identification Verified: Yes Hospitalized since last visit: No Secondary Verification Process Completed: Yes Has Dressing in Place as Prescribed: Yes Patient Requires Transmission-Based No Pain Present Now: No Precautions: Patient Has Alerts: Yes Patient Alerts: DM II Electronic Signature(s) Signed: 01/10/2018 4:27:21 PM By: Elliot Gurney, BSN, RN, CWS, Kim RN, BSN Entered By: Elliot Gurney, BSN, RN, CWS, Kim on 01/10/2018 08:51:05 [name], [name] ([id number]) -------------------------------------------------------------------------------- Clinic Level of Care Assessment Details Patient Name: [name] Date of Service: 01/10/2018 8:45 AM Medical Record Number: [id number] Patient Account Number: [id number] Date of Birth/Sex: 24-Oct-1961 (57 y.o. Female) Treating RN: Huel Coventry Primary Care Laterrica Libman: Joen [name] Other Clinician: Referring Rahshawn Remo: Joen [name] Treating Noriah Osgood/Extender: Altamese [place] in Treatment: 98 Clinic Level of Care Assessment Items TOOL 4 Quantity Score []  - Use when only an EandM is performed on FOLLOW-UP visit 0 ASSESSMENTS - Nursing Assessment /  Reassessment []  - Reassessment of Co-morbidities (includes updates in patient status) 0 []  - 0 Reassessment of Adherence to Treatment Plan ASSESSMENTS - Wound and Skin Assessment / Reassessment []  - Simple Wound Assessment / Reassessment - one wound 0 []  - 0 Complex Wound Assessment / Reassessment - multiple wounds []  - 0 Dermatologic / Skin Assessment (not related to wound area) ASSESSMENTS - Focused Assessment []  - Circumferential Edema Measurements - multi extremities 0 []  - 0 Nutritional Assessment / Counseling / Intervention []  - 0 Lower Extremity Assessment (monofilament, tuning fork, pulses) []  - 0 Peripheral Arterial Disease Assessment (using hand held doppler) ASSESSMENTS - Ostomy and/or Continence Assessment and Care []  - Incontinence Assessment and Management 0 []  - 0 Ostomy Care Assessment and Management (repouching, etc.) PROCESS - Coordination of Care []  - Simple Patient / Family Education for ongoing care 0 []  - 0 Complex (extensive) Patient / Family Education for ongoing care []  - 0 Staff obtains , Records, Test Results / Process Orders []  - 0 Staff telephones HHA, Nursing Homes / Clarify orders / etc []  - 0 Routine Transfer to another Facility (non-emergent condition) []  - 0 Routine Hospital Admission (non-emergent condition) []  - 0 New Admissions / / Ordering NPWT, Apligraf, etc. []  - 0 Emergency Hospital Admission (emergent condition) []  - 0 Simple Discharge Coordination Kiser, Chinmayi L. ( ) []  - 0 Complex (extensive) Discharge Coordination PROCESS - Special Needs []  - Pediatric / Minor Patient Management 0 []  - 0 Isolation Patient Management []  - 0 Hearing / Language / Visual special needs []  - 0 Assessment of Community assistance (transportation, D/C planning, etc.) []  - 0 Additional assistance / Altered mentation []  - 0 Support Surface(s) Assessment (bed, cushion, seat, etc.) INTERVENTIONS - Wound  Cleansing / Measurement X - Simple Wound Cleansing - one wound 1 5 []  - 0 Complex Wound Cleansing - multiple wounds X- 1 5 Wound Imaging (  photographs - any number of wounds) []  - 0 Wound Tracing (instead of photographs) X- 1 5 Simple Wound Measurement - one wound []  - 0 Complex Wound Measurement - multiple wounds INTERVENTIONS - Wound Dressings []  - Small Wound Dressing one or multiple wounds 0 X- 1 15 Medium Wound Dressing one or multiple wounds []  - 0 Large Wound Dressing one or multiple wounds []  - 0 Application of Medications - topical []  - 0 Application of Medications - injection INTERVENTIONS - Miscellaneous []  - External ear exam 0 []  - 0 Specimen Collection (cultures, biopsies, blood, body fluids, etc.) []  - 0 Specimen(s) / Culture(s) sent or taken to Lab for analysis []  - 0 Patient Transfer (multiple staff / / Similar devices) []  - 0 Simple Staple / Suture removal (25 or less) []  - 0 Complex Staple / Suture removal (26 or more) []  - 0 Hypo / Hyperglycemic Management (close monitor of Blood Glucose) []  - 0 Ankle / Brachial Index (ABI) - do not check if billed separately []  - 0 Vital Signs [name], [name] ( ) Has the patient been seen at the hospital within the last three years: Yes Total Score: 30 Level Of Care: New/Established - Level 1 Electronic Signature(s) Signed: 01/10/2018 4:27:21 PM By: , BSN, RN, CWS, Kim RN, BSN Entered By: , BSN, RN, CWS, Kim on 01/10/2018 08:57:36 [name], ( ) -------------------------------------------------------------------------------- Encounter Discharge Information Details Patient Name: Date of Service: 01/10/2018 8:45 AM Medical Record Number: Nurse, adult Patient Account Number: Date of Birth/Sex: Apr 21, 1961 (57 y.o. Female) Treating RN: Primary Care Kurtiss Wence: Other Clinician: Referring Ryleigh Buenger: [phone] Treating  Flynn Gwyn/Extender: 01/12/2018 in Treatment: 34 Encounter Discharge Information Items Discharge Pain Level: 0 Discharge Condition: Stable Ambulatory Status: Ambulatory Discharge Destination: Home Private Transportation: Auto Accompanied By: self Schedule Follow-up Appointment: Yes Medication Reconciliation completed and Yes provided to Patient/Care Rosamae Rocque: Clinical Summary of Care: Electronic Signature(s) Signed: 01/10/2018 4:27:21 PM By: 01/12/2018, BSN, RN, CWS, Kim RN, BSN Entered By: [name], BSN, RN, CWS, Kim on 01/10/2018 08:56:18 [name], [name] (01/12/2018) -------------------------------------------------------------------------------- Patient/Caregiver Education Details Patient Name: [id number] Date of Service: 01/10/2018 8:45 AM Medical Record Number: 03/27/1961 Patient Account Number: 01-30-2005 Date of Birth/Gender: 1960/12/27 (57 y.o. Female) Treating RN: Joen [name] Primary Care Physician: Altamese [employer] Other Clinician: Referring Physician: 73 Treating Physician/Extender: 01/12/2018 in Treatment: 66 Education Assessment Education Provided To: Patient Education Topics Provided Wound/Skin Impairment: Handouts: Caring for Your Ulcer Methods: Demonstration, Explain/Verbal Responses: State content correctly Electronic Signature(s) Signed: 01/10/2018 4:27:21 PM By: 01/12/2018, BSN, RN, CWS, Kim RN, BSN Entered By: [name], BSN, RN, CWS, Kim on 01/10/2018 08:56:03 [name], [name] (01/12/2018) -------------------------------------------------------------------------------- Wound Assessment Details Patient Name: [id number] Date of Service: 01/10/2018 8:45 AM Medical Record Number: 03/27/1961 Patient Account Number: [id number] Date of Birth/Sex: Mar 13, 1961 (57 y.o. Female) Treating RN: Joen [name] Primary Care Rishan Oyama: Altamese [place] Other Clinician: Referring Sharayah Renfrow: 73 Treating Mosie Angus/Extender: 01/12/2018 Weeks in  Treatment: 68 Wound Status Wound Number: 3 Primary Etiology: Pressure Ulcer Wound Location: Right Gluteal fold Wound Status: Open Wounding Event: Pressure Injury Date Acquired: 04/21/2016 Weeks Of Treatment: 68 Clustered Wound: No Photos Photo Uploaded By: 01/12/2018, BSN, RN, CWS, Kim on 01/10/2018 10:08:27 Wound Measurements Length: (cm) 0.5 Width: (cm) 0.8 Depth: (cm) 1.8 Area: (cm) 0.314 Volume: (cm) 0.565 % Reduction in Area: 89.8% % Reduction in Volume: 96.7% Wound Description Classification: Category/Stage IV Periwound Skin Texture  Texture Color No Abnormalities Noted: No No Abnormalities Noted: No Moisture No Abnormalities Noted: No Treatment Notes Wound #3 (Right Gluteal fold) 1. Cleansed with: Clean wound with Normal Saline 2. Anesthetic Topical Lidocaine 4% cream to wound bed prior to debridement 3. Peri-wound Care: Skin Prep 4. Dressing Applied: Hydrafera Blue [name], [name] ([id number]) 5. Secondary Dressing Applied [place] Notes hydrafera blue rope Electronic Signature(s) Signed: 01/10/2018 4:27:21 PM By: Elliot Gurney, BSN, RN, CWS, Kim RN, BSN Entered By: Elliot Gurney, BSN, RN, CWS, Kim on 01/10/2018 08:53:36

## 2018-01-17 ENCOUNTER — Ambulatory Visit: Payer: BLUE CROSS/BLUE SHIELD

## 2018-01-18 ENCOUNTER — Encounter: Payer: BLUE CROSS/BLUE SHIELD | Admitting: Nurse Practitioner

## 2018-01-18 DIAGNOSIS — L89314 Pressure ulcer of right buttock, stage 4: Secondary | ICD-10-CM | POA: Diagnosis not present

## 2018-01-19 NOTE — Progress Notes (Signed)
[name], [name] ([id number]) Visit Report for 01/18/2018 Chief Complaint Document Details Patient Name: [name], [name] Date of Service: 01/18/2018 8:00 AM Medical Record Number: [id number] Patient Account Number: [id number] Date of Birth/Sex: April 01, 1961 (57 y.o. Female) Treating RN: Ashok Cordia, Debi Primary Care Provider: Joen [name] Other Clinician: Referring Provider: Joen [name] Treating Provider/Extender: Kathreen Cosier in Treatment: 56 Information Obtained from: Patient Chief Complaint Patient is here for follow-up of a relation of her right ischial pressure ulcer Electronic Signature(s) Signed: 01/18/2018 4:11:02 PM By: Bonnell Public Entered By: Bonnell Public on 01/18/2018 08:26:41 [name], Katelyn Elbert Ewings ([id number]) -------------------------------------------------------------------------------- HPI Details Patient Name: [name] Date of Service: 01/18/2018 8:00 AM Medical Record Number: [id number] Patient Account Number: [id number] Date of Birth/Sex: Jan 25, 1961 (57 y.o. Female) Treating RN: Ashok Cordia, Debi Primary Care Provider: Joen [name] Other Clinician: Referring Provider: Joen [name] Treating Provider/Extender: Kathreen Cosier in Treatment: 9 History of Present Illness Location: bilateral gluteal ulcerations and left popliteal ulceration with tunneling Quality: adits to intermittent aching to ischial ulcers, no discomfort to sinus tract Severity: right iscial ulcer with increased depth Duration: chronic ulcers to bilateral ischial ulcers and sinus tract Timing: The pain is intermittent in severity as far as how intense it becomes but is present all the time. Manipulationn makes this worse. Context: The wound occurred when the patient had a fall and was unconscious for about 48 hours laying on the floor and she had pressure injury at that stage. Modifying Factors: Other treatment(s) tried include:as noted below she has been seen by visiting wound care physicians  or nurse practitioners and details have been noted Associated Signs and Symptoms: has yet to receive offloading cushions and mattress overlay HPI Description: 57 year old patient who was seen by visiting [name] wound care specialist for a wound on both her buttock and was found to have an unstageable wound on the right buttock for about 2 months. I understand that she had a fall and was laying on the floor for about 48 hours before she was found and taken to the ICU and had a long injury to her gluteal area from pressure and also had broken her right humerus. She has had a right proximal humerus fracture and has been followed up with orthopedics recently. The patient has a past medical history of type 2 diabetes mellitus, paraparesis, acute pyelonephritis, GERD, hypertension, glaucoma, chronic pain, anxiety neurosis, nicotine dependence, COPD. the patient had some debridement done and was to operative was recommended to use Silvadene dressing and offloading. She is a smoker and occasionally smokes a few cigarettes. the patient requested a second opinion for months and is here to discuss her care. 09/21/16; the patient re-presents from home today for review of 3 different wounds. I note that she was seen in the clinic here in July at which time she had bilateral buttock wounds. It was apparently suggested at that time that she use a wound VAC bridged to both wounds just near the initial tuberosity's bilaterally which she refused. The history was a bit difficult to put together. Apparently this patient became ill at the end of April of this year. She was found sitting on the floor she had apparently been for 2 days and subsequently admitted to hospital from 04/23/16 through 05/02/16 and at that point she was critically ill ultimately having sepsis secondary to UTI, nontraumatic rhabdomyolysis and diabetic ketoacidosis. She had acute renal failure and I think required ICU care including intubation.  Patient states her wounds actually started at that point on  the bilateral issue tuberosities however in reviewing the discharge summary from 5/8 I see no reference to wounds at that point. It did state that she had left lower extremity cellulitis however. Reviewing Epic I see no relevant x-rays. It would appear that her discharge creatinine was within the normal range and indeed on 9/15 her creatinine has remained normal. [facility] There the wounds on her bilateral Buttocks were dressed. Only just before her discharge from the nursing facility she developed an "knot" which was interpreted as cellulitis on the posterior aspect of her left [name] she was given antibiotics. Apparently sometime late in July a this actually opened and became a wound at home health care was tending to however she is still having purulent drainage coming from this and by my understanding the wound depth is actually become unmeasurable. I am not really clear about what home health has been placing in any of these wound areas. The patient states that is something with silver and it. She is not been systemically unwell no fever or chills her appetite is good. She is a diabetic poorly controlled however she states that her recent blood sugars at home have been in the low to mid 100s. 09/28/16 On evaluation today patient appears to continue to exhibit the 3 areas of ulceration that were noted previous. She did have an x-ray of the right pelvis which showed evidence of potential soft tissue infection but no obvious osteomyelitis. There was a discussion last office visit concerning the possibility of a wound VAC. Witth that being said the x-ray report suggested that an MRI may be more appropriate to further evaluate the extent. Subsequently in regard to the wound over the popliteal portion of the left lower extremity with tunneling at 12:00 the CT scan that was ordered was  denied by insurance as they state [name], [name] ([id number]) the patient has not had x-rays prior to advanced imaging. Patient states that she is frustrated with the situation overall. 10/05/16 in the interval since I last saw this patient last week she has had the x-ray of the [name] performed. I did review that x-ray today and fortunately shows no evidence of osteomyelitis or other acute abnormality at this point in time. She continues to have the opening iin the posterior left popliteal space with tracking proximal up the posterior thigh. Nothing seems to have worsened but it also seems to have not improved. The same is true in regard to the right pressure ulcer over the gluteal region which extends toward the ischium. The left gluteal pressure ulcer actually appears to be doing somewhat better my opinion there is some necrotic slough but overall this appears fairly well. She tells me thatt she has some discomfort especially when home health is helping her with dressing changes as they do not know her. At worse she rates her pain to be a 5 out of 10 right now it's more like a 1 out of 10. 10/07/16; still the patient has 3 different wound areas. She has a deep stage IV wound over her right ischial tuberosity. She is due to have an MRI next week. The wound over her left ischial tuberosity is more superficial and underwent debridement today. Finally she has a small open area in her left popliteal fossa the probes on measurably forward superiorly. Still a lot of drainage coming out of this. The CT scan that I ordered 3 weeks ago was questioned by her insurance company  wanting a plain x-ray first. As I understand things result of this is nothing has been done in 3 weeks in terms of imaging the thigh and she has an MRI booked of this along with her pelvis for next week line 10/12/16; patient has a deep probing wound over the right ischiall tuberosity, stage III wound over the left visual tuberosity and  a draining sinus in her left popliteal fossa. None of this much different from when I saw this 3 weeks ago. We have been using silver rope to the right ischial wound and a draining area in the left popliteal fossa. Plain silver alginate to the area on the left ischial tuberosity 10/19/16; the patient's wounds are essentially unchanged although the area on the left lower gluteal is actually improved. Our intake nurse noted drainage from the right initial tuberosity probing wound as well as the draining area in the left popliteal fossa. Both of these were cultured. She had x-rays I think at the insistence of her insurance company on 09/23/16 x-ray of the pelvis was not particularly helpful she did have soft tissue air over the right lower pelvis although with the depth of this wound this is not surprising. An x-ray of her left [name] did not show any specific abnormalities. We are still using silver alginate to these wound areas. Her MRI is booked for 10/27 10/26/16; cultures of the purulent drainage in her right initial tuberosity wound grew moderate Proteus and few staph aureus. The same organisms were cultured out of the left [name] sinus tract posteriorly. The staph aureus is MRSA. I had started her on Augmentin last week I added doxycycline. The MRI of the left lower extremity and pelvis was finally done. The MRI of the femur showed subcutaneous soft tissue swelling edema fluid and myositis in the vastus lateralis muscle but no soft tissue abscess septic arthritis or osteomyelitis. MRI of the pelvis showed the left wound to be more expensive extending down to the bone there was osteomyelitis. Left hamstring tendons were also involved. No septic arthritis involving the hip. The decubitus ulcer on the right side showed no definite osteomyelitis or abscess.. The right hip wound is actually the one the probes 6 cm downward. But the MRI showing infection including osteomyelitis on the left explains the  draining sinus in the popliteal fossa on the left. She did have antibiotics in the hospitalization last time and this extended into her nursing home stay but I'm not exactly sure what antibiotics and for what duration. According the patient this did include vancomycin with considerable effort of our staff we are able to get the patient into see Dr. Sampson Goon today. There were transportation difficulties. Her mother had open heart surgery and is in the ICU in [place] therefore her brother was unable to transport. Dr. Jarrett Ables office graciously arranged time to see her today. From my point of view she is going to require IV vancomycin plus perhaps a third generation cephalosporin. I plan to keep her on doxycycline and Augmentin until the IV antibiotics can be arranged. 11/02/2016 - [name] presents today for management of ulcers; She saw Dr. 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 She is to have the PICC line placed today and receive the initial dose for both antibiotics today. She has yet to receive the offloading chair cushion and/or mattress overlay from home health, apparently this has been a 3 week process. I encouraged her to speak  to home health regarding this matter, along with offering home health to contact the wouns care center with any questions or concerns. The left ischial pressure ulcer continues to imporve, while to right ischial ulcer has increased in depth. The popliteal fossa sinus tract remains unmeasurable due to the limitation of depth measurement (tract extends beyond our measuring devices). 11-16-2016 [name] presents today for evaluation and management of bilateral ischial stage IV pressure ulcers and sinus tract to the left popliteal fossa. she is under the care of Dr. Sampson Goon for IV antibiotic therapy; she states that the vancomycin was placed on hold and will be  restarted at a lower dose based on her renal function. She continues taking Zosyn in addition to the vancomycin. She also states that she has yet to receive offloading cushions from home health, according to her she does not qualify for these offloading cushions because "the ulcers are unstageable ". We will contact the home health agency today to lend clarity regarding her pressure ulcers. The left popliteal fossa sinus tract continues to be a measurable as it extends beyond the length of our measuring devices.. 11/30/16; the patient is still on vancomycin and Zosyn. The depth of the draining sinus behind her left popliteal fossa is down to [name], [name] ([id number]) 4 cm although there is still serosanguineous drainage coming out of this. She saw Dr. Sampson Goon of infectious disease yesterday the idea is to weeks more of IV antibiotics and then oral antibiotics although I have not read his note. The area on the left gluteal fold is just about healed. She has a 6 cm draining sinus over the right initial tuberosity although I cannot feel bone at the base of this. As far as the patient is aware she has not had a recheck of her inflammatory markers. 12/07/16; patient is on vancomycin and Zosyn appointment with Dr. Sampson Goon on the 19th at which point the patient expects to have a change in antibiotics. Remarkable improvement over the wound over the left ischial tuberosity which is just about closed. The draining sinus in her popliteal fossa has 0.4 cm in depth. The area on the right ischial tuberosity still probes down 7 cm. This is closed and overall wound dimensions but not depth. 12/14/16; patient is completing her vancomycin and Zosyn and per her she is going to transition to Bactrim and Augmentin for another 3 weeks. The area on her left gluteal fold is closed except for some skin tears. The area behind her left [name] is no longer has any depth. The only remaining area that is of clinical concern  is on the right gluteal fold probing towards the right ischial tuberosity. Today this measures 6.9 cm in depth. Very gritty surface 12/21/16; patient is now on Bactrim and Augmentin as directed by infectious disease. This should be for another 2 weeks. The area in her left gluteal fold and left popliteal are closed over and fully healed. Measurements today at 7 cm in the right buttock wound is unchanged from last week. 12/27/16; patient is on Bactrim and Augmentin for another week as directed by infectious disease. She has completed her IV antibiotics. She is not been systemically unwell no fever no chills. The area on her right buttock measured over 6 cm in depth. There is no palpable bone. No evidence of surrounding soft tissue infection. She is complaining of tongue irritation and has a history of thrush 01/04/17; patient is been back to see Dr. Sampson Goon, her Augmentin was stopped but  he continued the Bactrim for another 3 weeks. Depth of the wound is 6.7 cm there is been no major change in either direction. She is not receive the wound VAC from home health I think because of confusion about who is supposed to provided will actually talk to the home health agency today [[facility] The net no major change. 01/18/17; patient obtained her wound VAC about 10 days ago however for some reason it was not actually put on the wound. She is therefore here for [place] to apply this I guess. No other issues are noted. She is not complaining of pain fever drainage 02/01/17; patient is here now having the wound VAC or 3-4 weeks to a deep pressure area over the right initial tuberosity. This measures 6 cm in depth today which is about half a centimeter better than 2 weeks ago. There is no evidence she is systemically unwell no fever no chills no pain around the area. 02/15/17; I follow this patient every 2 weeks for a deep area over the right ischial tuberosity. This measures 5.5 cm today which is a continued  improvement of 1.2 cm from 1/10 and down 0.5 cm from her visit 2 weeks ago 03/01/17; continued difficult area over the right initial tuberosity using the wound VAC. Depth today of 5.1 cm which is improved. Does not appear to be a lot of drainage in the canister. Antibiotics were finally stopped by Dr. Sampson Goon bactrim[]  and inflammatory markers have been repeated 03/15/17; fall this lady every 2 weeks for a difficult area over her lower right gluteal area/ischial tuberosity. Depth today at 4.6 cm. This is a slow but steady improvement in the depth of this wound. Although we have labeled this as a pressure ulcer there may have been an underlying infection here at one point before we saw her. She had osteomyelitis on the left extensively which is since resolved 03/29/17- patient is here for follow-up evaluation of her right ischial pressure ulcer. She continues with the wound VAC and home health. According to the nurse home health has been using less foams and appropriate and we will instruct accordingly. The patient continues to smoke, approximately 10 cigarettes a day. She has been advised to decrease that in half by her next appointment, with a goal of complete cessation. She states her blood sugars have been consistently less than 150. She states that she spends most of her day position left lateral or prone. She does have an air mattress on her bed, she does not have an mattress for her chair, she states she cannot afford this. 04/12/17; patient is here for evaluation of her right ischial pressure ulcer. We continue to use a wound VAC with minimal improvement today the depth of this measuring 4.4 cm versus 4.6 cm 2 weeks ago. We are using a [employer] wound VAC on this area. There is not excessive drainage no pain. The patient tells me she tries to keep off this in bed but is up in the wheelchair for 2 hours a day. She is limited in no her overall ambulation but is improving and apparently is getting  bilateral lower extremity braces which she hopes will improve her ability to walk independently. 05/10/17; Depth at 3.3cm. Improved 05/24/17; depth at 3.5 cm. This is not improved since last time. Not clear if they are using collagen under the foam 06/07/17; depth that 2.9 which is a slight improvement. Still using the wound VAC. 06/21/17; depth is 2.9 cm which is exactly the same as  last time. Also the appearance of this wound is completely the same. We have been using silver collagen under a wound VAC. 07/05/17 patient presents today for reevaluation concerning her right Ischial wound. She had switched insurance companies and so it does appear that the Wound VAC needs to be reauthorized which we are working on this morning. Nonetheless her wound has been saying about the same we have continued to use the silver collagen underneath the wound VAC. She has no discomfort. 07/19/17; patient follows every 2 weeks for her right if she'll wound. Since I last saw this a month ago her dimensions of come down to 2.6 cm. This is slightly down from a month ago when it was 2.9 cm in 4 months ago it was 4.6 cm. I note that there Wyffels, Zayonna L. ([id number]) were insurance company issues with regard to the wound VAC which she apparently is not having on for several weeks now. I think it would be reasonable to change therapies here. Will use silver alginate rope 08/02/17 on evaluation today patient's wound actually appears to be doing significantly better in regard to the depth as compared to her previous evaluation. She is having no discomfort. Unfortunately she never got the Wound VAC reapproved following the insurance issues from several weeks ago. With that being said it does not appear that she needs to requires the Wound VAC anymore and in fact I feel that she is doing better and making greater improvements at this point without it. Fortunately she has no nausea, vomiting, diarrhea, fevers, or chills. 08/16/17;  patient's wound depth down to 2.3 cm. She is using silver alginate packing rope. Note that her wound VAC was discontinued due to insurance issues. This is not particularly surprising. She has not been systemically unwell and has no other new complaints 08/30/17; no change in depth. Still at 2.3 cm. Still with the same gritty surface requiring debridement. I've been using silver alginate for quite a period of time although this came down nicely in the last month 10/04/17; the depth of this is 2 cm however with careful inspection under high-intensity light most of the walls of this small probing sinus seemed to be normally epithelialized. I cannot exactly see the base of this however there is been no drainage. We've been using silver alginate there is no drainage on the dressing when it is removed. 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 There is no evidence of surrounding infection or pain. The patient had many review weakness in her bilateral legs. She is already been to see a neurologist who according the patient told her "you would never walk again" 11/01/17; this is a patient I last saw a month ago. At that point the linear tunnel in her right buttock was 2 cm. It was not possible to see the depth of this as skin had grown into the tunnel. In light of the fact that there was no drainage and no visible wound I recommended that we just allow her to go about her usual activity without dressing. She reports that almost immediately she noted drainage on her clothing although in spite of her instructions the contrary she did not come back to the clinic. She has not been specifically addressing this and is continued to no drainage without other symptoms either local or systemic 11/29/17; I follow this woman monthly. Last time the wound on her right buttock was 1.8 cm today measuring at 1.4 there has been gradual improvement in  this. Concerning is the fact that  the patient says that Dr. Sampson Goon of infectious disease changed her from doxycycline to Augmentin apparently because of the elevated inflammatory markers. She also said he did a culture of this area but she is not heard the results. I don't think I have his information available on care everywhere however I will check this. She is using Hydrofera Blue rope. The patient and her intake nurse report that she is still having identifiable drainage which certainly makes it clear that this is not closed. 01/02/17 on evaluation today patient appears to be doing fairly well in regard to her left gluteal wound. She has been continuing with the [facility] Dressing's here in our office. We actually see her once a month and then subsequently are performing weekly dressing changes with Hydrofera Blue Dressing rope during nurse visits one time a week. Today there really was not much drainage at this point. However this has happened previously where she had no drainage and was essentially thought to be close internally although the external had not completely pulled together. However then she reopened and began draining again. He has been mentioned the possibility of her seeing plastic surgery to try to get this finally and completely healed. 01/18/18 she is here in follow-up evaluation for right ischial pressure ulcer. She has an appointment with [facility] plastic surgery on 2/8 for evaluation. She continues to have minimal drainage at the tip of the dressing product. She continues to smoke, admits to 8-10 cigarettes per day. No change in treatment plan Electronic Signature(s) Signed: 01/18/2018 4:11:02 PM By: Bonnell Public Entered By: Bonnell Public on 01/18/2018 08:28:20 Tillett, [name] ([id number]) -------------------------------------------------------------------------------- Physician Orders Details Patient Name: [name] Date of Service: 01/18/2018 8:00 AM Medical Record Number: [id number] Patient  Account Number: [id number] Date of Birth/Sex: 09-Mar-1961 (56 y.o. Female) Treating RN: Ashok Cordia, Debi Primary Care Provider: Joen [name] Other Clinician: Referring Provider: Joen [name] Treating Provider/Extender: Kathreen Cosier in Treatment: 51 Verbal / Phone Orders: Yes Clinician: Ashok Cordia, Debi Read Back and Verified: Yes Diagnosis Coding Wound Cleansing Wound #3 Right Gluteal fold o Clean wound with Normal Saline. Anesthetic (add to Medication List) Wound #3 Right Gluteal fold o Topical Lidocaine 4% cream applied to wound bed prior to debridement (In Clinic Only). Skin Barriers/Peri-Wound Care Wound #3 Right Gluteal fold o Skin Prep Primary Wound Dressing Wound #3 Right Gluteal fold o Hydrafera Blue - rope Secondary Dressing Wound #3 Right Gluteal fold o [place] Dressing Change Frequency Wound #3 Right Gluteal fold o Change dressing every week Follow-up Appointments o Nurse Visit as needed o Other: - Once a week nurse visit for dressing change unless scheduled to see MD. Off-Loading Wound #3 Right Gluteal fold o Turn and reposition every 2 hours Additional Orders / Instructions Wound #3 Right Gluteal fold o Increase protein intake. Patient Medications Allergies: Biaxin Notifications Medication Indication Start End lidocaine Carlton, Keonia L. ([id number]) Notifications Medication Indication Start End DOSE 1 - topical 4 % cream - 1 cream topical Electronic Signature(s) Signed: 01/18/2018 4:11:02 PM By: Bonnell Public Entered By: Bonnell Public on 01/18/2018 08:30:32 Galas, [name] ([id number]) -------------------------------------------------------------------------------- Prescription 01/18/2018 Patient Name: [name] Provider: Bonnell Public NP Date of Birth: 1960-12-27 NPI#: [id number] Sex: F DEA#: [id number] Phone #: [phone] License #: Patient Address: [facility] [address]  [facility] [place], [place] [address] [address], [address] [place], [place] [address] [phone] Allergies Biaxin Medication Medication: Route:  Strength: Form: lidocaine 4 % topical cream topical 4% cream Class: TOPICAL LOCAL ANESTHETICS Dose: Frequency / Time: Indication: 1 1 cream topical Number of Refills: Number of Units: 0 Generic Substitution: Start Date: End Date: One Time Use: Substitution Permitted No Note to Pharmacy: Signature(s): Date(s): Electronic Signature(s) Signed: 01/18/2018 4:11:02 PM By: Bonnell Public Entered By: Bonnell Public on 01/18/2018 08:30:33 Viswanathan, [name] ([id number]) --------------------------------------------------------------------------------  Problem List Details Patient Name: [name] Date of Service: 01/18/2018 8:00 AM Medical Record Number: [id number] Patient Account Number: [id number] Date of Birth/Sex: 26-Mar-1961 (57 y.o. 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 09/21/2016 09/21/2016 Resolved Problems ICD-10 Code Description Active Date Resolved Date L89.323 Pressure ulcer of left buttock, stage 3 09/21/2016 09/21/2016 L89.324 Pressure ulcer of left buttock, stage 4 11/16/2016 11/16/2016 Electronic Signature(s) Signed: 01/18/2018 4:11:02 PM By: Bonnell Public Entered By: Bonnell Public on 01/18/2018 08:22:32 Son, Jamilya Elbert Ewings  ([id number]) -------------------------------------------------------------------------------- Progress Note Details Patient Name: [name] Date of Service: 01/18/2018 8:00 AM Medical Record Number: [id number] Patient Account Number: [id number] Date of Birth/Sex: 1961-07-20 (57 y.o. Female) Treating RN: Ashok Cordia, Debi Primary Care Provider: Joen [name] Other Clinician: Referring Provider: Joen [name] Treating Provider/Extender: Kathreen Cosier in Treatment: 11 Subjective Chief Complaint Information obtained from Patient Patient is here for follow-up of a relation of her right ischial pressure ulcer History of Present Illness (HPI) The following HPI elements were documented for the patient's wound: Location: bilateral gluteal ulcerations and left popliteal ulceration with tunneling Quality: adits to intermittent aching to ischial ulcers, no discomfort to sinus tract Severity: right iscial ulcer with increased depth Duration: chronic ulcers to bilateral ischial ulcers and sinus tract Timing: The pain is intermittent in severity as far as how intense it becomes but is present all the time. Manipulationn makes this worse. Context: The wound occurred when the patient had a fall and was unconscious for about 48 hours laying on the floor and she had pressure injury at that stage. Modifying Factors: Other treatment(s) tried include:as noted below she has been seen by visiting wound care physicians or nurse practitioners and details have been noted Associated Signs and Symptoms: has yet to receive offloading cushions and mattress overlay 57 year old patient who was seen by visiting [name] wound care specialist for a wound on both her buttock and was found to have an unstageable wound on the right buttock for about 2 months. I understand that she had a fall and was laying on the floor for about 48 hours before she was found and taken to the ICU and had a long injury to her gluteal area  from pressure and also had broken her right humerus. She has had a right proximal humerus fracture and has been followed up with orthopedics recently. The patient has a past medical history of type 2 diabetes mellitus, paraparesis, acute pyelonephritis, GERD, hypertension, glaucoma, chronic pain, anxiety neurosis, nicotine dependence, COPD. the patient had some debridement done and was to operative was recommended to use Silvadene dressing and offloading. She is a smoker and occasionally smokes a few cigarettes. the patient requested  a second opinion for months and is here to discuss her care. 09/21/16; the patient re-presents from home today for review of 3 different wounds. I note that she was seen in the clinic here in July at which time she had bilateral buttock wounds. It was apparently suggested at that time that she use a wound VAC bridged to both wounds just near the initial tuberosity's bilaterally which she refused. The history was a bit difficult to put together. Apparently this patient became ill at the end of April of this year. She was found sitting on the floor she had apparently been for 2 days and subsequently admitted to hospital from 04/23/16 through 05/02/16 and at that point she was critically ill ultimately having sepsis secondary to UTI, nontraumatic rhabdomyolysis and diabetic ketoacidosis. She had acute renal failure and I think required ICU care including intubation. Patient states her wounds actually started at that point on the bilateral issue tuberosities however in reviewing the discharge summary from 5/8 I see no reference to wounds at that point. It did state that she had left lower extremity cellulitis however. Reviewing Epic I see no relevant x-rays. It would appear that her discharge creatinine was within the normal range and indeed on 9/15 her creatinine has remained normal. [facility] There the wounds on  her bilateral Buttocks were dressed. Only just before her discharge from the nursing facility she developed an "knot" which was interpreted as cellulitis on the posterior aspect of her left [name] she was given antibiotics. Apparently sometime late in July a this actually opened and became a wound at home health care was tending to however she is still having purulent drainage coming from this and by my understanding the wound depth is actually become unmeasurable. I am not really clear about what home health has been placing in any of these wound areas. The patient states that is something with silver and it. She is not been [name], [name] ([id number]) systemically unwell no fever or chills her appetite is good. She is a diabetic poorly controlled however she states that her recent blood sugars at home have been in the low to mid 100s. 09/28/16 On evaluation today patient appears to continue to exhibit the 3 areas of ulceration that were noted previous. She did have an x-ray of the right pelvis which showed evidence of potential soft tissue infection but no obvious osteomyelitis. There was a discussion last office visit concerning the possibility of a wound VAC. Witth that being said the x-ray report suggested that an MRI may be more appropriate to further evaluate the extent. Subsequently in regard to the wound over the popliteal portion of the left lower extremity with tunneling at 12:00 the CT scan that was ordered was denied by insurance as they state the patient has not had x-rays prior to advanced imaging. Patient states that she is frustrated with the situation overall. 10/05/16 in the interval since I last saw this patient last week she has had the x-ray of the [name] performed. I did review that x-ray today and fortunately shows no evidence of osteomyelitis or other acute abnormality at this point in time. She continues to have the opening iin the posterior left popliteal space with tracking  proximal up the posterior thigh. Nothing seems to have worsened but it also seems to have not improved. The same is true in regard to the right pressure ulcer over the gluteal region which extends toward the  ischium. The left gluteal pressure ulcer actually appears to be doing somewhat better my opinion there is some necrotic slough but overall this appears fairly well. She tells me thatt she has some discomfort especially when home health is helping her with dressing changes as they do not know her. At worse she rates her pain to be a 5 out of 10 right now it's more like a 1 out of 10. 10/07/16; still the patient has 3 different wound areas. She has a deep stage IV wound over her right ischial tuberosity. She is due to have an MRI next week. The wound over her left ischial tuberosity is more superficial and underwent debridement today. Finally she has a small open area in her left popliteal fossa the probes on measurably forward superiorly. Still a lot of drainage coming out of this. The CT scan that I ordered 3 weeks ago was questioned by her insurance company wanting a plain x-ray first. As I understand things result of this is nothing has been done in 3 weeks in terms of imaging the thigh and she has an MRI booked of this along with her pelvis for next week line 10/12/16; patient has a deep probing wound over the right ischiall tuberosity, stage III wound over the left visual tuberosity and a draining sinus in her left popliteal fossa. None of this much different from when I saw this 3 weeks ago. We have been using silver rope to the right ischial wound and a draining area in the left popliteal fossa. Plain silver alginate to the area on the left ischial tuberosity 10/19/16; the patient's wounds are essentially unchanged although the area on the left lower gluteal is actually improved. Our intake nurse noted drainage from the right initial tuberosity probing wound as well as the draining area in  the left popliteal fossa. Both of these were cultured. She had x-rays I think at the insistence of her insurance company on 09/23/16 x-ray of the pelvis was not particularly helpful she did have soft tissue air over the right lower pelvis although with the depth of this wound this is not surprising. An x-ray of her left [name] did not show any specific abnormalities. We are still using silver alginate to these wound areas. Her MRI is booked for 10/27 10/26/16; cultures of the purulent drainage in her right initial tuberosity wound grew moderate Proteus and few staph aureus. The same organisms were cultured out of the left [name] sinus tract posteriorly. The staph aureus is MRSA. I had started her on Augmentin last week I added doxycycline. The MRI of the left lower extremity and pelvis was finally done. The MRI of the femur showed subcutaneous soft tissue swelling edema fluid and myositis in the vastus lateralis muscle but no soft tissue abscess septic arthritis or osteomyelitis. MRI of the pelvis showed the left wound to be more expensive extending down to the bone there was osteomyelitis. Left hamstring tendons were also involved. No septic arthritis involving the hip. The decubitus ulcer on the right side showed no definite osteomyelitis or abscess.. The right hip wound is actually the one the probes 6 cm downward. But the MRI showing infection including osteomyelitis on the left explains the draining sinus in the popliteal fossa on the left. She did have antibiotics in the hospitalization last time and this extended into her nursing home stay but I'm not exactly sure what antibiotics and for what duration. According the patient this did include vancomycin with considerable effort of our  staff we are able to get the patient into see Dr. Sampson Goon today. There were transportation difficulties. Her mother had open heart surgery and is in the ICU in [place] therefore her brother was unable to  transport. Dr. Jarrett Ables office graciously arranged time to see her today. From my point of view she is going to require IV vancomycin plus perhaps a third generation cephalosporin. I plan to keep her on doxycycline and Augmentin until the IV antibiotics can be arranged. 11/02/2016 - Maebry presents today for management of ulcers; She saw Dr. 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 She is to have the PICC line placed today and receive the initial dose for both antibiotics today. She has yet to receive the offloading chair cushion and/or mattress overlay from home health, apparently this has been a 3 week process. I encouraged her to speak to home health regarding this matter, along with offering home health to contact the wouns care center with any questions or concerns. The left ischial pressure ulcer continues to imporve, while to right ischial ulcer has increased in depth. The popliteal fossa sinus tract remains unmeasurable due to the limitation of depth measurement (tract extends beyond our measuring devices). [name], [name] ([id number]) 11-16-2016 [name] presents today for evaluation and management of bilateral ischial stage IV pressure ulcers and sinus tract to the left popliteal fossa. she is under the care of Dr. Sampson Goon for IV antibiotic therapy; she states that the vancomycin was placed on hold and will be restarted at a lower dose based on her renal function. She continues taking Zosyn in addition to the vancomycin. She also states that she has yet to receive offloading cushions from home health, according to her she does not qualify for these offloading cushions because "the ulcers are unstageable ". We will contact the home health agency today to lend clarity regarding her pressure ulcers. The left popliteal fossa sinus tract continues to be a measurable as it extends beyond the  length of our measuring devices.. 11/30/16; the patient is still on vancomycin and Zosyn. The depth of the draining sinus behind her left popliteal fossa is down to 4 cm although there is still serosanguineous drainage coming out of this. She saw Dr. Sampson Goon of infectious disease yesterday the idea is to weeks more of IV antibiotics and then oral antibiotics although I have not read his note. The area on the left gluteal fold is just about healed. She has a 6 cm draining sinus over the right initial tuberosity although I cannot feel bone at the base of this. As far as the patient is aware she has not had a recheck of her inflammatory markers. 12/07/16; patient is on vancomycin and Zosyn appointment with Dr. Sampson Goon on the 19th at which point the patient expects to have a change in antibiotics. Remarkable improvement over the wound over the left ischial tuberosity which is just about closed. The draining sinus in her popliteal fossa has 0.4 cm in depth. The area on the right ischial tuberosity still probes down 7 cm. This is closed and overall wound dimensions but not depth. 12/14/16; patient is completing her vancomycin and Zosyn and per her she is going to transition to Bactrim and Augmentin for another 3 weeks. The area on her left gluteal fold is closed except for some skin tears. The area behind her left [name] is no longer has any depth. The only remaining area that  is of clinical concern is on the right gluteal fold probing towards the right ischial tuberosity. Today this measures 6.9 cm in depth. Very gritty surface 12/21/16; patient is now on Bactrim and Augmentin as directed by infectious disease. This should be for another 2 weeks. The area in her left gluteal fold and left popliteal are closed over and fully healed. Measurements today at 7 cm in the right buttock wound is unchanged from last week. 12/27/16; patient is on Bactrim and Augmentin for another week as directed by infectious  disease. She has completed her IV antibiotics. She is not been systemically unwell no fever no chills. The area on her right buttock measured over 6 cm in depth. There is no palpable bone. No evidence of surrounding soft tissue infection. She is complaining of tongue irritation and has a history of thrush 01/04/17; patient is been back to see Dr. Sampson Goon, her Augmentin was stopped but he continued the Bactrim for another 3 weeks. Depth of the wound is 6.7 cm there is been no major change in either direction. She is not receive the wound VAC from home health I think because of confusion about who is supposed to provided will actually talk to the home health agency today [[facility] The net no major change. 01/18/17; patient obtained her wound VAC about 10 days ago however for some reason it was not actually put on the wound. She is therefore here for [place] to apply this I guess. No other issues are noted. She is not complaining of pain fever drainage 02/01/17; patient is here now having the wound VAC or 3-4 weeks to a deep pressure area over the right initial tuberosity. This measures 6 cm in depth today which is about half a centimeter better than 2 weeks ago. There is no evidence she is systemically unwell no fever no chills no pain around the area. 02/15/17; I follow this patient every 2 weeks for a deep area over the right ischial tuberosity. This measures 5.5 cm today which is a continued improvement of 1.2 cm from 1/10 and down 0.5 cm from her visit 2 weeks ago 03/01/17; continued difficult area over the right initial tuberosity using the wound VAC. Depth today of 5.1 cm which is improved. Does not appear to be a lot of drainage in the canister. Antibiotics were finally stopped by Dr. Sampson Goon bactrim[]  and inflammatory markers have been repeated 03/15/17; fall this lady every 2 weeks for a difficult area over her lower right gluteal area/ischial tuberosity. Depth today at 4.6 cm. This is a slow but  steady improvement in the depth of this wound. Although we have labeled this as a pressure ulcer there may have been an underlying infection here at one point before we saw her. She had osteomyelitis on the left extensively which is since resolved 03/29/17- patient is here for follow-up evaluation of her right ischial pressure ulcer. She continues with the wound VAC and home health. According to the nurse home health has been using less foams and appropriate and we will instruct accordingly. The patient continues to smoke, approximately 10 cigarettes a day. She has been advised to decrease that in half by her next appointment, with a goal of complete cessation. She states her blood sugars have been consistently less than 150. She states that she spends most of her day position left lateral or prone. She does have an air mattress on her bed, she does not have an mattress for her chair, she states she cannot afford  this. 04/12/17; patient is here for evaluation of her right ischial pressure ulcer. We continue to use a wound VAC with minimal improvement today the depth of this measuring 4.4 cm versus 4.6 cm 2 weeks ago. We are using a [employer] wound VAC on this area. There is not excessive drainage no pain. The patient tells me she tries to keep off this in bed but is up in the wheelchair for 2 hours a day. She is limited in no her overall ambulation but is improving and apparently is getting bilateral lower extremity braces which she hopes will improve her ability to walk independently. 05/10/17; Depth at 3.3cm. Improved 05/24/17; depth at 3.5 cm. This is not improved since last time. Not clear if they are using collagen under the foam 06/07/17; depth that 2.9 which is a slight improvement. Still using the wound VAC. [name], [name] ([id number]) 06/21/17; depth is 2.9 cm which is exactly the same as last time. Also the appearance of this wound is completely the same. We have been using silver collagen under a  wound VAC. 07/05/17 patient presents today for reevaluation concerning her right Ischial wound. She had switched insurance companies and so it does appear that the Wound VAC needs to be reauthorized which we are working on this morning. Nonetheless her wound has been saying about the same we have continued to use the silver collagen underneath the wound VAC. She has no discomfort. 07/19/17; patient follows every 2 weeks for her right if she'll wound. Since I last saw this a month ago her dimensions of come down to 2.6 cm. This is slightly down from a month ago when it was 2.9 cm in 4 months ago it was 4.6 cm. I note that there were insurance company issues with regard to the wound VAC which she apparently is not having on for several weeks now. I think it would be reasonable to change therapies here. Will use silver alginate rope 08/02/17 on evaluation today patient's wound actually appears to be doing significantly better in regard to the depth as compared to her previous evaluation. She is having no discomfort. Unfortunately she never got the Wound VAC reapproved following the insurance issues from several weeks ago. With that being said it does not appear that she needs to requires the Wound VAC anymore and in fact I feel that she is doing better and making greater improvements at this point without it. Fortunately she has no nausea, vomiting, diarrhea, fevers, or chills. 08/16/17; patient's wound depth down to 2.3 cm. She is using silver alginate packing rope. Note that her wound VAC was discontinued due to insurance issues. This is not particularly surprising. She has not been systemically unwell and has no other new complaints 08/30/17; no change in depth. Still at 2.3 cm. Still with the same gritty surface requiring debridement. I've been using silver alginate for quite a period of time although this came down nicely in the last month 10/04/17; the depth of this is 2 cm however with careful  inspection under high-intensity light most of the walls of this small probing sinus seemed to be normally epithelialized. I cannot exactly see the base of this however there is been no drainage. We've been using silver alginate there is no drainage on the dressing when it is removed. 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 There is no evidence of surrounding infection or pain. The patient had many review weakness in her bilateral legs. She is already been  to see a neurologist who according the patient told her "you would never walk again" 11/01/17; this is a patient I last saw a month ago. At that point the linear tunnel in her right buttock was 2 cm. It was not possible to see the depth of this as skin had grown into the tunnel. In light of the fact that there was no drainage and no visible wound I recommended that we just allow her to go about her usual activity without dressing. She reports that almost immediately she noted drainage on her clothing although in spite of her instructions the contrary she did not come back to the clinic. She has not been specifically addressing this and is continued to no drainage without other symptoms either local or systemic 11/29/17; I follow this woman monthly. Last time the wound on her right buttock was 1.8 cm today measuring at 1.4 there has been gradual improvement in this. Concerning is the fact that the patient says that Dr. Sampson Goon of infectious disease changed her from doxycycline to Augmentin apparently because of the elevated inflammatory markers. She also said he did a culture of this area but she is not heard the results. I don't think I have his information available on care everywhere however I will check this. She is using Hydrofera Blue rope. The patient and her intake nurse report that she is still having identifiable drainage which certainly makes it clear that this is not closed. 01/02/17 on  evaluation today patient appears to be doing fairly well in regard to her left gluteal wound. She has been continuing with the [facility] Dressing's here in our office. We actually see her once a month and then subsequently are performing weekly dressing changes with Hydrofera Blue Dressing rope during nurse visits one time a week. Today there really was not much drainage at this point. However this has happened previously where she had no drainage and was essentially thought to be close internally although the external had not completely pulled together. However then she reopened and began draining again. He has been mentioned the possibility of her seeing plastic surgery to try to get this finally and completely healed. 01/18/18 she is here in follow-up evaluation for right ischial pressure ulcer. She has an appointment with [facility] on 2/8 for evaluation. She continues to have minimal drainage at the tip of the dressing product. She continues to smoke, admits to 8-10 cigarettes per day. No change in treatment plan Patient History Information obtained from Patient. Family History Cancer - Mother, Diabetes - Father, Hypertension - Mother,Father, Stroke - Mother, No family history of Heart Disease, Kidney Disease, Lung Disease, Seizures, Thyroid Problems. Social History [name], [name] ([id number]) Former smoker, Marital Status - Divorced, Alcohol Use - Never, Drug Use - Prior History, Caffeine Use - Rarely. Medical History Hospitalization/Surgery History - 04/09/2016, [facility]. Objective Constitutional Vitals Time Taken: 8:08 AM, Height: 63 in, Weight: 257 lbs, BMI: 45.5, Temperature: 98.3 F, Pulse: 98 bpm, Respiratory Rate: 18 breaths/min, Blood Pressure: 142/62 mmHg. Integumentary (Hair, Skin) Wound #3 status is Open. Original cause of wound was Pressure Injury. The wound is located on the Right Gluteal fold. The wound measures 0.5cm length x 0.8cm width x 1.7cm depth;  0.314cm^2 area and 0.534cm^3 volume. There is no tunneling or undermining noted. There is a medium amount of serous drainage noted. The wound margin is distinct with the outline attached to the wound base. There is large (67-100%) pink granulation within the wound bed.  There is a small (1-33%) amount of necrotic tissue within the wound bed including Adherent Slough. Periwound temperature was noted as No Abnormality. The periwound has tenderness on palpation. 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 Anesthetic (add to Medication List): Wound #3 Right Gluteal fold: Topical Lidocaine 4% cream applied to wound bed prior to debridement (In Clinic Only). Skin Barriers/Peri-Wound Care: Wound #3 Right Gluteal fold: Skin Prep Oleary, Naoma L. ([id number]) Primary Wound Dressing: Wound #3 Right Gluteal fold: Hydrafera Blue - rope Secondary Dressing: Wound #3 Right Gluteal fold: [place] Dressing Change Frequency: Wound #3 Right Gluteal fold: Change dressing every week Follow-up Appointments: Nurse Visit as needed Other: - Once a week nurse visit for dressing change unless scheduled to see MD. Off-Loading: Wound #3 Right Gluteal fold: Turn and reposition every 2 hours Additional Orders / Instructions: Wound #3 Right Gluteal fold: Increase protein intake. The following medication(s) was prescribed: lidocaine topical 4 % cream 1 1 cream topical was prescribed at facility 1. follow up next week for dressing change 2. follow up in two weeks 3. maintain appointment with [facility] 4. continue working on smoking cessation Electronic Signature(s) Signed: 01/18/2018 4:11:02 PM By: Bonnell Public Entered By: Bonnell Public on 01/18/2018 08:32:24 Troyer, [name]  ([id number]) -------------------------------------------------------------------------------- ROS/PFSH Details Patient Name: [name] Date of Service: 01/18/2018 8:00 AM Medical Record Number: [id number] Patient Account Number: [id number] Date of Birth/Sex: 1961-11-16 (57 y.o. Female) Treating RN: Ashok Cordia, Debi Primary Care Provider: Joen [name] Other Clinician: Referring Provider: Joen [name] Treating Provider/Extender: Kathreen Cosier in Treatment: 69 Information Obtained From Patient Wound History Do you currently have one or more open woundso Yes How many open wounds do you currently haveo 3 Approximately how long have you had your woundso since April 2017 How have you been treating your wound(s) until nowo silver Has your wound(s) ever healed and then re-openedo No Have you had any lab work done in the past montho No Have you tested positive for an antibiotic resistant organism (MRSA, VRE)o No Have you tested positive for osteomyelitis (bone infection)o No Have you had any tests for circulation on your legso No Have you had other problems associated with your woundso Swelling Eyes Medical History: Negative for: Cataracts; Glaucoma; Optic Neuritis Ear/Nose/Mouth/Throat Medical History: Negative for: Chronic sinus problems/congestion; Middle ear problems Hematologic/Lymphatic Medical History: Negative for: Anemia; Hemophilia; Human Immunodeficiency Virus; Lymphedema; Sickle Cell Disease Respiratory Medical History: Positive for: Asthma Negative for: Aspiration; Chronic Obstructive Pulmonary Disease (COPD); Pneumothorax; Sleep Apnea; Tuberculosis Cardiovascular Medical History: Positive for: Hypertension Negative for: Angina; Arrhythmia; Congestive Heart Failure; Coronary Artery Disease; Deep Vein Thrombosis; Hypotension; Myocardial Infarction; Peripheral Arterial Disease; Peripheral Venous Disease; Phlebitis; Vasculitis Gastrointestinal Medical  History: Negative for: Cirrhosis ; Colitis; Crohnos; Hepatitis A; Hepatitis B; Hepatitis C Endocrine Scheidegger, Malaka L. ([id number]) Medical History: Positive for: Type II Diabetes Negative for: Type I Diabetes Time with diabetes: 30 yrs Treated with: Insulin Blood sugar tested every day: Yes Tested : Blood sugar testing results: Breakfast: 121 Genitourinary Medical History: Negative for: End Stage Renal Disease Immunological Medical History: Negative for: Lupus Erythematosus; Raynaudos; Scleroderma Integumentary (Skin) Medical History: Negative for: History of Burn; History of pressure wounds Musculoskeletal Medical History: Negative for: Gout; Rheumatoid Arthritis; Osteoarthritis; Osteomyelitis Neurologic Medical History: Positive for: Neuropathy Negative for: Dementia; Quadriplegia; Paraplegia; Seizure Disorder Oncologic Medical  History: Negative for: Received Chemotherapy; Received Radiation Psychiatric Medical History: Negative for: Anorexia/bulimia; Confinement Anxiety Immunizations Pneumococcal Vaccine: Received Pneumococcal Vaccination: No Implantable Devices Hospitalization / Surgery History Name of Hospital Purpose of Hospitalization/Surgery Date [facility] AMS 04/09/2016 Family and Social History Cancer: Yes - Mother; Diabetes: Yes - Father; Heart Disease: No; Hypertension: Yes - Mother,Father; Kidney Disease: No; Lung Disease: No; Seizures: No; Stroke: Yes - Mother; Thyroid Problems: No; Former smoker; Marital Status - Divorced; [place], [place]. ([id number]) Alcohol Use: Never; Drug Use: Prior History; Caffeine Use: Rarely; Advanced Directives: No; Patient does not want information on Advanced Directives; Living Will: No; Medical Power of Attorney: No Physician Affirmation I have reviewed and agree with the above information. Electronic Signature(s) Signed: 01/18/2018 4:11:02 PM By: Bonnell Public Signed: 01/18/2018 4:11:23 PM By: Alejandro Mulling Entered By: Bonnell Public on 01/18/2018 08:29:06 Gimpel, [name] ([id number]) -------------------------------------------------------------------------------- SuperBill Details Patient Name: [name] Date of Service: 01/18/2018 Medical Record Number: [id number] Patient Account Number: [id number] Date of Birth/Sex: Jun 08, 1961 (57 y.o. Female) Treating RN: Ashok Cordia, Debi Primary Care Provider: Joen [name] Other Clinician: Referring Provider: Joen [name] Treating Provider/Extender: Kathreen Cosier in Treatment: 69 Diagnosis Coding ICD-10 Codes Code Description L89.314 Pressure ulcer of right buttock, stage 4 E11.42 Type 2 diabetes mellitus with diabetic polyneuropathy Facility Procedures CPT4 Code: [id number] Description: 99213 - WOUND CARE VISIT-LEV 3 EST PT Modifier: Quantity: 1 Physician Procedures CPT4 Code: [id number] Description: 99213 - WC PHYS LEVEL 3 - EST PT ICD-10 Diagnosis Description L89.314 Pressure ulcer of right buttock, stage 4 E11.42 Type 2 diabetes mellitus with diabetic polyneuropathy Modifier: Quantity: 1 Electronic Signature(s) Signed: 01/18/2018 9:08:13 AM By: Alejandro Mulling Signed: 01/18/2018 4:11:02 PM By: Bonnell Public Entered By: Alejandro Mulling on 01/18/2018 09:08:13

## 2018-01-23 DIAGNOSIS — L89314 Pressure ulcer of right buttock, stage 4: Secondary | ICD-10-CM | POA: Diagnosis not present

## 2018-01-23 NOTE — Progress Notes (Signed)
LAMIS, BEHRMANN ([id number]) Visit Report for 01/18/2018 Arrival Information Details Patient Name: [name], [name] Date of Service: 01/18/2018 8:00 AM Medical Record Number: [id number] Patient Account Number: [id number] Date of Birth/Sex: [birth year] (57 y.o. Female) Treating RN: [name] Primary Care [name]: [name] Other Clinician: Referring [name]: [name] Treating [name]/Extender: [name] in Treatment: 29 Visit Information History Since Last Visit All ordered tests and consults were completed: No Patient Arrived: Wheel Chair Added or deleted any medications: No Arrival Time: 08:01 Any new allergies or adverse reactions: No Accompanied By: self Had a fall or experienced change in No Transfer Assistance: EasyPivot Patient activities of daily living that may affect Lift risk of falls: Patient Identification Verified: Yes Signs or symptoms of abuse/neglect since last visito No Secondary Verification Process Yes Hospitalized since last visit: No Completed: Has Dressing in Place as Prescribed: Yes Patient Requires Transmission-Based No Precautions: Pain Present Now: No Patient Has Alerts: Yes Patient Alerts: DM II Electronic Signature(s) Signed: 01/18/2018 4:11:23 PM By: [name] Entered By: [name] on 01/18/2018 08:08:24 Mcomber, [name] ([id number]) -------------------------------------------------------------------------------- Clinic Level of Care Assessment Details Patient Name: [name] Date of Service: 01/18/2018 8:00 AM Medical Record Number: [id number] Patient Account Number: [id number] Date of Birth/Sex: 03-01-1961 (56 y.o. Female) Treating RN: [name] Primary Care [name]: [name] Other Clinician: Referring [name]: [name] Treating [name]/Extender: [name] in Treatment: 72 Clinic Level of Care Assessment Items TOOL 4 Quantity Score X - Use when only an EandM is performed on  FOLLOW-UP visit 1 0 ASSESSMENTS - Nursing Assessment / Reassessment X - Reassessment of Co-morbidities (includes updates in patient status) 1 10 X- 1 5 Reassessment of Adherence to Treatment Plan ASSESSMENTS - Wound and Skin Assessment / Reassessment X - Simple Wound Assessment / Reassessment - one wound 1 5 []  - 0 Complex Wound Assessment / Reassessment - multiple wounds []  - 0 Dermatologic / Skin Assessment (not related to wound area) ASSESSMENTS - Focused Assessment []  - Circumferential Edema Measurements - multi extremities 0 []  - 0 Nutritional Assessment / Counseling / Intervention []  - 0 Lower Extremity Assessment (monofilament, tuning fork, pulses) []  - 0 Peripheral Arterial Disease Assessment (using hand held doppler) ASSESSMENTS - Ostomy and/or Continence Assessment and Care []  - Incontinence Assessment and Management 0 []  - 0 Ostomy Care Assessment and Management (repouching, etc.) PROCESS - Coordination of Care X - Simple Patient / Family Education for ongoing care 1 15 []  - 0 Complex (extensive) Patient / Family Education for ongoing care []  - 0 Staff obtains Programmer, systems, Records, Test Results / Process Orders []  - 0 Staff telephones HHA, Nursing Homes / Clarify orders / etc []  - 0 Routine Transfer to another Facility (non-emergent condition) []  - 0 Routine Hospital Admission (non-emergent condition) []  - 0 New Admissions / Biomedical engineer / Ordering NPWT, Apligraf, etc. []  - 0 Emergency Hospital Admission (emergent condition) X- 1 10 Simple Discharge Coordination [name], [name] []  - 0 Complex (extensive) Discharge Coordination PROCESS - Special Needs []  - Pediatric / Minor Patient Management 0 []  - 0 Isolation Patient Management []  - 0 Hearing / Language / Visual special needs []  - 0 Assessment of Community assistance (transportation, D/C planning, etc.) []  - 0 Additional assistance / Altered mentation []  - 0 Support Surface(s)  Assessment (bed, cushion, seat, etc.) INTERVENTIONS - Wound Cleansing / Measurement X - Simple Wound Cleansing - one wound 1 5 []  - 0 Complex Wound Cleansing - multiple wounds X-  1 5 Wound Imaging (photographs - any number of wounds) []  - 0 Wound Tracing (instead of photographs) X- 1 5 Simple Wound Measurement - one wound []  - 0 Complex Wound Measurement - multiple wounds INTERVENTIONS - Wound Dressings X - Small Wound Dressing one or multiple wounds 1 10 []  - 0 Medium Wound Dressing one or multiple wounds []  - 0 Large Wound Dressing one or multiple wounds X- 1 5 Application of Medications - topical []  - 0 Application of Medications - injection INTERVENTIONS - Miscellaneous []  - External ear exam 0 []  - 0 Specimen Collection (cultures, biopsies, blood, body fluids, etc.) []  - 0 Specimen(s) / Culture(s) sent or taken to Lab for analysis []  - 0 Patient Transfer (multiple staff / Civil Service fast streamer / Similar devices) []  - 0 Simple Staple / Suture removal (25 or less) []  - 0 Complex Staple / Suture removal (26 or more) []  - 0 Hypo / Hyperglycemic Management (close monitor of Blood Glucose) []  - 0 Ankle / Brachial Index (ABI) - do not check if billed separately X- 1 5 Vital Signs Heck, [name] ([id number]) Has the patient been seen at the hospital within the last three years: Yes Total Score: 80 Level Of Care: New/Established - Level 3 Electronic Signature(s) Signed: 01/18/2018 4:11:23 PM By: [name] Entered By: [name] on 01/18/2018 09:08:05 [name], [name] -------------------------------------------------------------------------------- Encounter Discharge Information Details Patient Name: [name] Date of Service: 01/18/2018 8:00 AM Medical Record Number: [id number] Patient Account Number: [id number] Date of Birth/Sex: 12-13-1961 (57 y.o. Female) Treating RN: [name] Primary Care [name]: [name] Other Clinician: Referring  [name]: [name] Treating [name]/Extender: [name] in Treatment: 46 Encounter Discharge Information Items Discharge Pain Level: 0 Discharge Condition: Stable Ambulatory Status: Wheelchair Discharge Destination: Home Transportation: Private Auto Accompanied By: self Schedule Follow-up Appointment: Yes Medication Reconciliation completed and No provided to Patient/Care Marine Lezotte: Patient Clinical Summary of Care: Declined Electronic Signature(s) Signed: 01/23/2018 10:47:22 AM By: Ruthine Dose Entered By: Ruthine Dose on 01/18/2018 08:27:10 Zeiger, [name] ([id number]) -------------------------------------------------------------------------------- Lower Extremity Assessment Details Patient Name: [name] Date of Service: 01/18/2018 8:00 AM Medical Record Number: [id number] Patient Account Number: [id number] Date of Birth/Sex: June 12, 1961 (56 y.o. Female) Treating RN: Ahmed Prima Primary Care Ashwath Lasch: [name] Other Clinician: Referring Sanaii Caporaso: [name] Treating Mardy Hoppe/Extender: [name] in Treatment: 40 Electronic Signature(s) Signed: 01/18/2018 4:11:23 PM By: [name] Entered By: [name] on 01/18/2018 08:14:03 Kimm, [name] ([id number]) -------------------------------------------------------------------------------- Multi Wound Chart Details Patient Name: [name] Date of Service: 01/18/2018 8:00 AM Medical Record Number: [id number] Patient Account Number: [id number] Date of Birth/Sex: 14-Jan-1961 (57 y.o. Female) Treating RN: [name] Primary Care Amarrah Meinhart: [name] Other Clinician: Referring Denisse Whitenack: [name] Treating Sheza Strickland/Extender: [name] in Treatment: 59 Vital Signs Height(in): 63 Pulse(bpm): 98 Weight(lbs): 75 Blood Pressure(mmHg): 142/62 Body Mass Index(BMI): 46 Temperature(F): 98.3 Respiratory Rate 18 (breaths/min): Photos: [3:No Photos] [N/A:N/A] Wound  Location: [3:Right Gluteal fold] [N/A:N/A] Wounding Event: [3:Pressure Injury] [N/A:N/A] Primary Etiology: [3:Pressure Ulcer] [N/A:N/A] Comorbid History: [3:Asthma, Hypertension, Type II Diabetes, Neuropathy] [N/A:N/A] Date Acquired: [3:04/21/2016] [N/A:N/A] Weeks of Treatment: [3:69] [N/A:N/A] Wound Status: [3:Open] [N/A:N/A] Measurements L x W x D [3:0.5x0.8x1.7] [N/A:N/A] (cm) Area (cm) : [3:0.314] [N/A:N/A] Volume (cm) : [3:0.534] [N/A:N/A] % Reduction in Area: [3:89.80%] [N/A:N/A] % Reduction in Volume: [3:96.90%] [N/A:N/A] Classification: [3:Category/Stage IV] [N/A:N/A] Exudate Amount: [3:Medium] [N/A:N/A] Exudate Type: [3:Serous] [N/A:N/A] Exudate Color: [3:amber] [N/A:N/A] Wound Margin: [3:Distinct, outline attached] [N/A:N/A] Granulation Amount: [3:Large (  67-100%)] [N/A:N/A] Granulation Quality: [3:Pink] [N/A:N/A] Necrotic Amount: [3:Small (1-33%)] [N/A:N/A] Exposed Structures: [3:Fascia: No Fat Layer (Subcutaneous Tissue) Exposed: No Tendon: No Muscle: No Joint: No Bone: No] [N/A:N/A] Epithelialization: [3:None] [N/A:N/A] Periwound Skin Texture: [3:No Abnormalities Noted] [N/A:N/A] Periwound Skin Moisture: [3:No Abnormalities Noted] [N/A:N/A] Periwound Skin Color: [3:No Abnormalities Noted] [N/A:N/A] Temperature: [3:No Abnormality] [N/A:N/A] Tenderness on Palpation: [3:Yes] [N/A:N/A] Wound Preparation: [N/A:N/A] Ulcer Cleansing: Rinsed/Irrigated with Saline Topical Anesthetic Applied: Other: lidocaine 4% Treatment Notes Wound #3 (Right Gluteal fold) 1. Cleansed with: Clean wound with Normal Saline 2. Anesthetic Topical Lidocaine 4% cream to wound bed prior to debridement 3. Peri-wound Care: Skin Prep 4. Dressing Applied: Hydrafera Blue 5. Secondary Adeline Notes hydrafera blue rope Electronic Signature(s) Signed: 01/18/2018 4:11:02 PM By: Lawanda Cousins Entered By: Lawanda Cousins on 01/18/2018 08:26:06 [place], [name]  ([id number]) -------------------------------------------------------------------------------- [place] Details Patient Name: [name] Date of Service: 01/18/2018 8:00 AM Medical Record Number: [id number] Patient Account Number: [id number] Date of Birth/Sex: 05/06/1961 (57 y.o. Female) Treating RN: [name] Primary Care Dianne Whelchel: [name] Other Clinician: Referring Kahley Leib: [name] Treating Makinzi Prieur/Extender: [name] in Treatment: 42 Active Inactive ` Abuse / Safety / Falls / Self Care Management Nursing Diagnoses: Potential for falls Goals: Patient will remain injury free Date Initiated: 09/21/2016 Target Resolution Date: 03/25/2017 Goal Status: Active Interventions: Assess fall risk on admission and as needed Notes: ` Nutrition Nursing Diagnoses: Imbalanced nutrition Potential for alteratiion in Nutrition/Potential for imbalanced nutrition Goals: Patient/caregiver agrees to and verbalizes understanding of need to use nutritional supplements and/or vitamins as prescribed Date Initiated: 09/21/2016 Target Resolution Date: 03/25/2017 Goal Status: Active Patient/caregiver verbalizes understanding of need to maintain therapeutic glucose control per primary care physician Date Initiated: 09/21/2016 Target Resolution Date: 03/25/2017 Goal Status: Active Interventions: Assess patient nutrition upon admission and as needed per policy Notes: ` Orientation to the Wound Care Program Nursing Diagnoses: Knowledge deficit related to the wound healing center program Goals: Patient/caregiver will verbalize understanding of the [place], [name] ([id number]) Date Initiated: 09/21/2016 Target Resolution Date: 03/25/2017 Goal Status: Active Interventions: Provide education on orientation to the wound center Notes: ` Pain, Acute or Chronic Nursing Diagnoses: Pain, acute or chronic: actual or potential Potential  alteration in comfort, pain Goals: Patient will verbalize adequate pain control and receive pain control interventions during procedures as needed Date Initiated: 09/21/2016 Target Resolution Date: 03/25/2017 Goal Status: Active Patient/caregiver will verbalize adequate pain control between visits Date Initiated: 09/21/2016 Target Resolution Date: 03/25/2017 Goal Status: Active Patient/caregiver will verbalize comfort level met Date Initiated: 09/21/2016 Target Resolution Date: 03/25/2017 Goal Status: Active Interventions: Assess comfort goal upon admission Complete pain assessment as per visit requirements Notes: ` Wound/Skin Impairment Nursing Diagnoses: Impaired tissue integrity Goals: Ulcer/skin breakdown will have a volume reduction of 30% by week 4 Date Initiated: 09/21/2016 Target Resolution Date: 03/25/2017 Goal Status: Active Ulcer/skin breakdown will have a volume reduction of 50% by week 8 Date Initiated: 09/21/2016 Target Resolution Date: 03/25/2017 Goal Status: Active Ulcer/skin breakdown will have a volume reduction of 80% by week 12 Date Initiated: 09/21/2016 Target Resolution Date: 03/25/2017 Goal Status: Active Interventions: Assess ulceration(s) every visit Notes: [name], [name] ([id number]) Electronic Signature(s) Signed: 01/18/2018 4:11:23 PM By: [name] Entered By: [name] on 01/18/2018 08:14:09 [name], Kenton Vale ([id number]) -------------------------------------------------------------------------------- Pain Assessment Details Patient Name: [name] Date of Service: 01/18/2018 8:00 AM Medical Record Number: [id number] Patient Account Number: [id number] Date of Birth/Sex: Aug 04, 1961 (58 y.o.  Female) Treating RN: [name] Primary Care Laylynn Campanella: [name] Other Clinician: Referring Joelie Schou: [name] Treating Tanav Orsak/Extender: [name] in Treatment: 36 Active Problems Location of Pain Severity and Description of  Pain Patient Has Paino No Site Locations Pain Management and Medication Current Pain Management: Electronic Signature(s) Signed: 01/18/2018 4:11:23 PM By: [name] Entered By: [name] on 01/18/2018 08:08:32 [name], [name] ([id number]) -------------------------------------------------------------------------------- Patient/Caregiver Education Details Patient Name: [name] Date of Service: 01/18/2018 8:00 AM Medical Record Number: [id number] Patient Account Number: [id number] Date of Birth/Gender: 1961/07/22 (56 y.o. Female) Treating RN: [name] Primary Care Physician: [name] Other Clinician: Referring Physician: Lavera [name] Treating Physician/Extender: [name] in Treatment: 69 Education Assessment Education Provided To: Patient Education Topics Provided Wound/Skin Impairment: Handouts: Caring for Your Ulcer, Other: change dressing as ordered Methods: Demonstration, Explain/Verbal Responses: State content correctly Electronic Signature(s) Signed: 01/18/2018 4:11:23 PM By: [name] Entered By: [name] on 01/18/2018 08:20:51 Hitz, Malania Carlean [name] ([id number]) -------------------------------------------------------------------------------- Wound Assessment Details Patient Name: [name] Date of Service: 01/18/2018 8:00 AM Medical Record Number: [id number] Patient Account Number: [id number] Date of Birth/Sex: 1961/11/29 (57 y.o. Female) Treating RN: [name] Primary Care Janeshia Ciliberto: [name] Other Clinician: Referring Elsworth Ledin: [name] Treating Dezirae Service/Extender: [name] in Treatment: 69 Wound Status Wound Number: 3 Primary Pressure Ulcer Etiology: Wound Location: Right Gluteal fold Wound Status: Open Wounding Event: Pressure Injury Comorbid Asthma, Hypertension, Type II Diabetes, Date Acquired: 04/21/2016 History: Neuropathy Weeks Of Treatment: 69 Clustered Wound: No Photos Photo  Uploaded By: [name] on 01/18/2018 09:45:57 Wound Measurements Length: (cm) 0.5 Width: (cm) 0.8 Depth: (cm) 1.7 Area: (cm) 0.314 Volume: (cm) 0.534 % Reduction in Area: 89.8% % Reduction in Volume: 96.9% Epithelialization: None Tunneling: No Undermining: No Wound Description Classification: Category/Stage IV Wound Margin: Distinct, outline attached Exudate Amount: Medium Exudate Type: Serous Exudate Color: amber Foul Odor After Cleansing: No Slough/Fibrino Yes Wound Bed Granulation Amount: Large (67-100%) Exposed Structure Granulation Quality: Pink Fascia Exposed: No Necrotic Amount: Small (1-33%) Fat Layer (Subcutaneous Tissue) Exposed: No Necrotic Quality: Adherent Slough Tendon Exposed: No Muscle Exposed: No Joint Exposed: No Bone Exposed: No Periwound Skin Texture [place], [name] ([id number]) Texture Color No Abnormalities Noted: No No Abnormalities Noted: No Moisture Temperature / Pain No Abnormalities Noted: No Temperature: No Abnormality Tenderness on Palpation: Yes Wound Preparation Ulcer Cleansing: Rinsed/Irrigated with Saline Topical Anesthetic Applied: Other: lidocaine 4%, Electronic Signature(s) Signed: 01/18/2018 4:11:23 PM By: [name] Entered By: [name] on 01/18/2018 08:13:54 [place], [name] ([id number]) -------------------------------------------------------------------------------- Vitals Details Patient Name: [name] Date of Service: 01/18/2018 8:00 AM Medical Record Number: [id number] Patient Account Number: [id number] Date of Birth/Sex: [birth year] (56 y.o. Female) Treating RN: [name] Primary Care Vivian Neuwirth: [name] Other Clinician: Referring Cordero Surette: [name] Treating Izeah Vossler/Extender: [name] in Treatment: 30 Vital Signs Time Taken: 08:08 Temperature (F): 98.3 Height (in): 63 Pulse (bpm): 98 Weight (lbs): 257 Respiratory Rate (breaths/min): 18 Body Mass Index (BMI):  45.5 Blood Pressure (mmHg): 142/62 Reference Range: 80 - 120 mg / dl Electronic Signature(s) Signed: 01/18/2018 4:11:23 PM By: [name] Entered By: [name] on 01/18/2018 08:09:13

## 2018-01-24 ENCOUNTER — Ambulatory Visit: Payer: BLUE CROSS/BLUE SHIELD

## 2018-01-24 NOTE — Progress Notes (Signed)
[name], [name] ([id number]) Visit Report for 01/23/2018 Arrival Information Details Patient Name: [name] Date of Service: 01/23/2018 8:00 AM Medical Record Number: [id number] Patient Account Number: [id number] Date of Birth/Sex: [birth year] (57 y.o. Female) Treating RN: [name], [name] Primary Care [name]: [name] Other Clinician: Referring [name]: [name] Treating Amali Uhls/Extender: [name]: 52 Visit Information History Since Last Visit All ordered tests and consults were completed: No Patient Arrived: Wheel Chair Added or deleted any medications: No Arrival Time: 08:04 Any new allergies or adverse reactions: No Accompanied By: self Had a fall or experienced change in No Transfer Assistance: EasyPivot Patient activities of daily living that may affect Lift risk of falls: Patient Identification Verified: Yes Signs or symptoms of abuse/neglect since last visito No Secondary Verification Process Yes Hospitalized since last visit: No Completed: Has Dressing in Place as Prescribed: Yes Patient Requires Transmission-Based No Precautions: Pain Present Now: No Patient Has Alerts: Yes Patient Alerts: DM II Electronic Signature(s) Signed: 01/23/2018 4:26:07 PM By: [name] Entered By: [name] on 01/23/2018 08:04:42 [name], [name] ([id number]) -------------------------------------------------------------------------------- Clinic Level of Care Assessment Details Patient Name: [name] Date of Service: 01/23/2018 8:00 AM Medical Record Number: [id number] Patient Account Number: [id number] Date of Birth/Sex: Jun 26, 1961 (56 y.o. Female) Treating RN: [name], [name] Primary Care [name]: [name] Other Clinician: Referring [name]: [name] Treating [name]/Extender: [name]: 79 Clinic Level of Care Assessment Items TOOL 4 Quantity Score X - Use when only an EandM is performed on  FOLLOW-UP visit 1 0 ASSESSMENTS - Nursing Assessment / Reassessment X - Reassessment of Co-morbidities (includes updates in patient status) 1 10 X- 1 5 Reassessment of Adherence to [name] Plan ASSESSMENTS - Wound and Skin Assessment / Reassessment X - Simple Wound Assessment / Reassessment - one wound 1 5 []  - 0 Complex Wound Assessment / Reassessment - multiple wounds []  - 0 Dermatologic / Skin Assessment (not related to wound area) ASSESSMENTS - Focused Assessment []  - Circumferential Edema Measurements - multi extremities 0 []  - 0 Nutritional Assessment / Counseling / Intervention []  - 0 Lower Extremity Assessment (monofilament, tuning fork, pulses) []  - 0 Peripheral Arterial Disease Assessment (using hand held doppler) ASSESSMENTS - Ostomy and/or Continence Assessment and Care []  - Incontinence Assessment and Management 0 []  - 0 Ostomy Care Assessment and Management (repouching, etc.) PROCESS - Coordination of Care X - Simple Patient / Family Education for ongoing care 1 15 []  - 0 Complex (extensive) Patient / Family Education for ongoing care []  - 0 Staff obtains , Records, Test Results / Process Orders []  - 0 Staff telephones HHA, Nursing Homes / Clarify orders / etc []  - 0 Routine Transfer to another Facility (non-emergent condition) []  - 0 Routine Hospital Admission (non-emergent condition) []  - 0 New Admissions / / Ordering NPWT, Apligraf, etc. []  - 0 Emergency Hospital Admission (emergent condition) X- 1 10 Simple Discharge Coordination [name], [name] ( ) []  - 0 Complex (extensive) Discharge Coordination PROCESS - Special Needs []  - Pediatric / Minor Patient Management 0 []  - 0 Isolation Patient Management []  - 0 Hearing / Language / Visual special needs []  - 0 Assessment of Community assistance (transportation, D/C planning, etc.) []  - 0 Additional assistance / Altered mentation []  - 0 Support Surface(s)  Assessment (bed, cushion, seat, etc.) INTERVENTIONS - Wound Cleansing / Measurement X - Simple Wound Cleansing - one wound 1 5 []  - 0 Complex Wound Cleansing - multiple wounds X-  1 5 Wound Imaging (photographs - any number of wounds) []  - 0 Wound Tracing (instead of photographs) X- 1 5 Simple Wound Measurement - one wound []  - 0 Complex Wound Measurement - multiple wounds INTERVENTIONS - Wound Dressings X - Small Wound Dressing one or multiple wounds 1 10 []  - 0 Medium Wound Dressing one or multiple wounds []  - 0 Large Wound Dressing one or multiple wounds X- 1 5 Application of Medications - topical []  - 0 Application of Medications - injection INTERVENTIONS - Miscellaneous []  - External ear exam 0 []  - 0 Specimen Collection (cultures, biopsies, blood, body fluids, etc.) []  - 0 Specimen(s) / Culture(s) sent or taken to Lab for analysis []  - 0 Patient Transfer (multiple staff / / Similar devices) []  - 0 Simple Staple / Suture removal (25 or less) []  - 0 Complex Staple / Suture removal (26 or more) []  - 0 Hypo / Hyperglycemic Management (close monitor of Blood Glucose) []  - 0 Ankle / Brachial Index (ABI) - do not check if billed separately X- 1 5 Vital Signs [name], [name] ( ) Has the patient been seen at the hospital within the last three years: Yes Total Score: 80 Level Of Care: New/Established - Level 3 Electronic Signature(s) Signed: 01/23/2018 4:26:07 PM By: Entered By: on 01/23/2018 08:06:16 [name], ( ) -------------------------------------------------------------------------------- Encounter Discharge Information Details Patient Name: Date of Service: 01/23/2018 8:00 AM Medical Record Number: Patient Account Number: Date of Birth/Sex: [birth year] (57 y.o. Female) Treating RN: [id number], [name] Primary Care Giannie Soliday: 01/25/2018 Other Clinician: Referring  Justinian [name]: [name] Treating [name]/Extender: [name] in [name]: 25 Encounter Discharge Information Items Discharge Pain Level: 0 Discharge Condition: Stable Ambulatory Status: Wheelchair Discharge Destination: Home Transportation: Other Accompanied By: self Schedule Follow-up Appointment: Yes Medication Reconciliation completed and provided No to Patient/Care [name]: Clinical Summary of Care: Electronic Signature(s) Signed: 01/23/2018 4:26:07 PM By: [id number] Entered By: [name] on 01/23/2018 08:05:55 [name], [name] ([id number]) -------------------------------------------------------------------------------- Patient/Caregiver Education Details Patient Name: 03/27/1961 Date of Service: 01/23/2018 8:00 AM Medical Record Number: [name] Patient Account Number: [name] Date of Birth/Gender: 1961-11-16 (56 y.o. Female) Treating RN: 78, [name] Primary Care Physician: 01/25/2018 Other Clinician: Referring Physician: Alejandro [name] Treating Physician/Extender: [name] in [name]: 41 Education Assessment Education Provided To: Patient Education Topics Provided Wound/Skin Impairment: Handouts: Caring for Your Ulcer, Other: change dressing as ordered Methods: Demonstration, Explain/Verbal Responses: State content correctly Electronic Signature(s) Signed: 01/23/2018 4:26:07 PM By: [id number] Entered By: [name] on 01/23/2018 08:05:39 [name], [name] [id number] ([id number]) -------------------------------------------------------------------------------- Wound Assessment Details Patient Name: 03/27/1961 Date of Service: 01/23/2018 8:00 AM Medical Record Number: [name] Patient Account Number: [name] Date of Birth/Sex: 10-16-1961 (57 y.o. Female) Treating RN: 78, [name] Primary Care Emanuel Dowson: 01/25/2018 Other Clinician: Referring Daveion Robar: [name] Treating Kristee Angus/Extender: [name]  in [name]: 69 Wound Status Wound Number: 3 Primary Pressure Ulcer Etiology: Wound Location: Right Gluteal fold Wound Status: Open Wounding Event: Pressure Injury Comorbid Asthma, Hypertension, Type II Diabetes, Date Acquired: 04/21/2016 History: Neuropathy Weeks Of [name]: 69 Clustered Wound: No Photos Photo Uploaded By: [name] on 01/23/2018 16:49:05 Wound Measurements Length: (cm) 0.5 % Reduct Width: (cm) 0.8 % Reduct Depth: (cm) 0.5 Epitheli Area: (cm) 0.314 Tunneli Volume: (cm) 0.157 Undermi ion in Area: 89.8% ion in Volume: 99.1% alization: None ng: No ning: No Wound Description Classification: Category/Stage IV Wound Margin: Distinct, outline attached  Exudate Amount: Medium Exudate Type: Serous Exudate Color: amber Foul Odor After Cleansing: No Slough/Fibrino Yes Wound Bed Granulation Amount: Large (67-100%) Exposed Structure Granulation Quality: Pink Fascia Exposed: No Necrotic Amount: Small (1-33%) Fat Layer (Subcutaneous Tissue) Exposed: No Necrotic Quality: Adherent Slough Tendon Exposed: No Muscle Exposed: No Joint Exposed: No Bone Exposed: No Periwound Skin Texture Woodring, Golda L. ([id number]) Texture Color No Abnormalities Noted: No No Abnormalities Noted: No Moisture Temperature / Pain No Abnormalities Noted: No Temperature: No Abnormality Tenderness on Palpation: Yes Wound Preparation Ulcer Cleansing: Rinsed/Irrigated with Saline Topical Anesthetic Applied: Other: lidocaine 4%, [name] Notes Wound #3 (Right Gluteal fold) 1. Cleansed with: Clean wound with Normal Saline 2. Anesthetic Topical Lidocaine 4% cream to wound bed prior to debridement 3. Peri-wound Care: Skin Prep 4. Dressing Applied: Hydrafera Blue 5. Secondary Dressing Applied [place] Notes hydrafera blue rope Electronic Signature(s) Signed: 01/23/2018 4:26:07 PM By: [name] Entered By: [name] on 01/23/2018 08:11:18

## 2018-01-30 ENCOUNTER — Encounter: Payer: BLUE CROSS/BLUE SHIELD | Attending: Physician Assistant

## 2018-01-30 DIAGNOSIS — Z87891 Personal history of nicotine dependence: Secondary | ICD-10-CM | POA: Insufficient documentation

## 2018-01-30 DIAGNOSIS — L89314 Pressure ulcer of right buttock, stage 4: Secondary | ICD-10-CM | POA: Insufficient documentation

## 2018-01-30 DIAGNOSIS — E1142 Type 2 diabetes mellitus with diabetic polyneuropathy: Secondary | ICD-10-CM | POA: Diagnosis not present

## 2018-02-05 NOTE — Progress Notes (Signed)
Katelyn, [name] ([id number]) Visit Report for 01/30/2018 Arrival Information Details Patient Name: [name], [name] Date of Service: 01/30/2018 8:00 AM Medical Record Number: [id number] Patient Account Number: [id number] Date of Birth/Sex: [birth year] (57 y.o. Female) Treating RN: Huel Coventry Primary Care Anabell Swint: Joen [name] Other Clinician: Referring Raja Caputi: Joen [name] Treating Edsel Shives/Extender: Linwood Dibbles, HOYT Weeks in Treatment: 70 Visit Information History Since Last Visit Added or deleted any medications: No Patient Arrived: Wheel Chair Any new allergies or adverse reactions: No Arrival Time: 08:14 Had a fall or experienced change in No activities of daily living that may affect Accompanied By: self risk of falls: Transfer Assistance: None Signs or symptoms of abuse/neglect since last visito No Patient Identification Verified: Yes Hospitalized since last visit: No Secondary Verification Process Completed: Yes Has Dressing in Place as Prescribed: Yes Patient Requires Transmission-Based No Pain Present Now: No Precautions: Patient Has Alerts: Yes Patient Alerts: DM II Electronic Signature(s) Signed: 02/05/2018 11:05:32 AM By: Elliot Gurney, BSN, RN, CWS, Kim RN, BSN Entered By: Elliot Gurney, BSN, RN, CWS, Kim on 01/30/2018 08:15:21 Schrade, [name] ([id number]) -------------------------------------------------------------------------------- Clinic Level of Care Assessment Details Patient Name: [name] Date of Service: 01/30/2018 8:00 AM Medical Record Number: [id number] Patient Account Number: [id number] Date of Birth/Sex: 31-Jan-1961 (56 y.o. Female) Treating RN: Huel Coventry Primary Care Daleisa Halperin: Joen [name] Other Clinician: Referring Nallely Yost: Joen [name] Treating Toniqua Melamed/Extender: Linwood Dibbles, HOYT Weeks in Treatment: 70 Clinic Level of Care Assessment Items TOOL 4 Quantity Score []  - Use when only an EandM is performed on FOLLOW-UP visit 0 ASSESSMENTS - Nursing Assessment /  Reassessment []  - Reassessment of Co-morbidities (includes updates in patient status) 0 X- 1 5 Reassessment of Adherence to Treatment Plan ASSESSMENTS - Wound and Skin Assessment / Reassessment X - Simple Wound Assessment / Reassessment - one wound 1 5 []  - 0 Complex Wound Assessment / Reassessment - multiple wounds []  - 0 Dermatologic / Skin Assessment (not related to wound area) ASSESSMENTS - Focused Assessment []  - Circumferential Edema Measurements - multi extremities 0 []  - 0 Nutritional Assessment / Counseling / Intervention []  - 0 Lower Extremity Assessment (monofilament, tuning fork, pulses) []  - 0 Peripheral Arterial Disease Assessment (using hand held doppler) ASSESSMENTS - Ostomy and/or Continence Assessment and Care []  - Incontinence Assessment and Management 0 []  - 0 Ostomy Care Assessment and Management (repouching, etc.) PROCESS - Coordination of Care X - Simple Patient / Family Education for ongoing care 1 15 []  - 0 Complex (extensive) Patient / Family Education for ongoing care []  - 0 Staff obtains Chiropractor, Records, Test Results / Process Orders []  - 0 Staff telephones HHA, Nursing Homes / Clarify orders / etc []  - 0 Routine Transfer to another Facility (non-emergent condition) []  - 0 Routine Hospital Admission (non-emergent condition) []  - 0 New Admissions / Manufacturing engineer / Ordering NPWT, Apligraf, etc. []  - 0 Emergency Hospital Admission (emergent condition) X- 1 10 Simple Discharge Coordination [place]sereau, Tawania L. ([id number]) []  - 0 Complex (extensive) Discharge Coordination PROCESS - Special Needs []  - Pediatric / Minor Patient Management 0 []  - 0 Isolation Patient Management []  - 0 Hearing / Language / Visual special needs []  - 0 Assessment of Community assistance (transportation, D/C planning, etc.) []  - 0 Additional assistance / Altered mentation []  - 0 Support Surface(s) Assessment (bed, cushion, seat, etc.) INTERVENTIONS - Wound  Cleansing / Measurement X - Simple Wound Cleansing - one wound 1 5 []  - 0 Complex Wound Cleansing - multiple wounds X- 1 5  Wound Imaging (photographs - any number of wounds) []  - 0 Wound Tracing (instead of photographs) X- 1 5 Simple Wound Measurement - one wound []  - 0 Complex Wound Measurement - multiple wounds INTERVENTIONS - Wound Dressings []  - Small Wound Dressing one or multiple wounds 0 X- 1 15 Medium Wound Dressing one or multiple wounds []  - 0 Large Wound Dressing one or multiple wounds []  - 0 Application of Medications - topical []  - 0 Application of Medications - injection INTERVENTIONS - Miscellaneous []  - External ear exam 0 []  - 0 Specimen Collection (cultures, biopsies, blood, body fluids, etc.) []  - 0 Specimen(s) / Culture(s) sent or taken to Lab for analysis []  - 0 Patient Transfer (multiple staff / / Similar devices) []  - 0 Simple Staple / Suture removal (25 or less) []  - 0 Complex Staple / Suture removal (26 or more) []  - 0 Hypo / Hyperglycemic Management (close monitor of Blood Glucose) []  - 0 Ankle / Brachial Index (ABI) - do not check if billed separately []  - 0 Vital Signs Duling, Merlene L. ( ) Has the patient been seen at the hospital within the last three years: Yes Total Score: 65 Level Of Care: New/Established - Level 2 Electronic Signature(s) Signed: 02/05/2018 11:05:32 AM By: , BSN, RN, CWS, Kim RN, BSN Entered By: , BSN, RN, CWS, Kim on 01/30/2018 08:24:33 Floyd, ( ) -------------------------------------------------------------------------------- Encounter Discharge Information Details Patient Name: Date of Service: 01/30/2018 8:00 AM Medical Record Number: Nurse, adult Patient Account Number: Date of Birth/Sex: [birth year] (56 y.o. Female) Treating RN: Primary Care Efton Thomley: Other Clinician: Referring Kamela Blansett: [id number] Treating  Brendolyn Stockley/Extender: 04/05/2018, HOYT Weeks in Treatment: 79 Encounter Discharge Information Items Discharge Pain Level: 1 Discharge Condition: Stable Ambulatory Status: Ambulatory Discharge Destination: Home Private Transportation: Auto Accompanied By: self Schedule Follow-up Appointment: Yes Medication Reconciliation completed and Yes provided to Patient/Care Karly Pitter: Clinical Summary of Care: Electronic Signature(s) Signed: 02/05/2018 11:05:32 AM By: 03/30/2018, BSN, RN, CWS, Kim RN, BSN Entered By: [name], BSN, RN, CWS, Kim on 01/30/2018 08:24:02 [name] (03/30/2018) -------------------------------------------------------------------------------- Patient/Caregiver Education Details Patient Name: [id number] Date of Service: 01/30/2018 8:00 AM Medical Record Number: 03/27/1961 Patient Account Number: 01-30-2005 Date of Birth/Gender: 1961-11-24 (56 y.o. Female) Treating RN: Joen [name] Primary Care Physician: Linwood Dibbles Other Clinician: Referring Physician: 66 Treating Physician/Extender: 04/05/2018 in Treatment: 69 Education Assessment Education Provided To: Patient Education Topics Provided Wound/Skin Impairment: Handouts: Caring for Your Ulcer Methods: Demonstration, Explain/Verbal Responses: State content correctly Electronic Signature(s) Signed: 02/05/2018 11:05:32 AM By: 03/30/2018, BSN, RN, CWS, Kim RN, BSN Entered By: [name], BSN, RN, CWS, Kim on 01/30/2018 08:23:40 Rosengrant, [name] (03/30/2018) -------------------------------------------------------------------------------- Wound Assessment Details Patient Name: [id number] Date of Service: 01/30/2018 8:00 AM Medical Record Number: 03/27/1961 Patient Account Number: 01-30-2005 Date of Birth/Sex: [birth year] (56 y.o. Female) Treating RN: Joen [name] Primary Care Anay Walter: Skeet Simmer Other Clinician: Referring Earla Charlie: 66 Treating Martia Dalby/Extender: 04/05/2018, HOYT Weeks in Treatment:  70 Wound Status Wound Number: 3 Primary Pressure Ulcer Etiology: Wound Location: Right Gluteal fold Wound Status: Open Wounding Event: Pressure Injury Comorbid Asthma, Hypertension, Type II Diabetes, Date Acquired: 04/21/2016 History: Neuropathy Weeks Of Treatment: 70 Clustered Wound: No Photos Photo Uploaded By: Elliot Gurney, BSN, RN, CWS, Kim on 01/30/2018 09:20:14 Wound Measurements Length: (cm) 0.5 Width: (cm) 0.5 Depth: (cm) 1.5 Area: (cm) 0.196 Volume: (cm) 0.295 % Reduction in Area: 93.6% % Reduction in  Volume: 98.3% Epithelialization: Small (1-33%) Tunneling: No Undermining: No Wound Description Classification: Category/Stage IV Foul Odor Wound Margin: Distinct, outline attached Slough/Fi Exudate Amount: Medium Exudate Type: Serous Exudate Color: amber After Cleansing: No brino Yes Wound Bed Granulation Amount: Large (67-100%) Exposed Structure Granulation Quality: Pink Fascia Exposed: No Necrotic Amount: Small (1-33%) Fat Layer (Subcutaneous Tissue) Exposed: No Necrotic Quality: Adherent Slough Tendon Exposed: No Muscle Exposed: No Joint Exposed: No Bone Exposed: No Periwound Skin Texture Texture Color Wynns, Biddie L. ([id number]) No Abnormalities Noted: No No Abnormalities Noted: No Moisture Temperature / Pain No Abnormalities Noted: No Temperature: No Abnormality Tenderness on Palpation: Yes Wound Preparation Ulcer Cleansing: Rinsed/Irrigated with Saline Topical Anesthetic Applied: Other: lidocaine 4%, Assessment Notes Cannot visualize base of wound Treatment Notes Wound #3 (Right Gluteal fold) 1. Cleansed with: Clean wound with Normal Saline 4. Dressing Applied: Hydrafera Blue 5. Secondary Dressing Applied Bordered Foam Dressing Notes hydrafera blue rope Electronic Signature(s) Signed: 02/05/2018 11:05:32 AM By: Elliot Gurney, BSN, RN, CWS, Kim RN, BSN Entered By: Elliot Gurney, BSN, RN, CWS, Kim on 01/30/2018 78:29:56

## 2018-02-06 ENCOUNTER — Encounter: Payer: BLUE CROSS/BLUE SHIELD | Admitting: Physician Assistant

## 2018-02-06 DIAGNOSIS — L89314 Pressure ulcer of right buttock, stage 4: Secondary | ICD-10-CM | POA: Diagnosis not present

## 2018-02-07 NOTE — Progress Notes (Signed)
LANDIS, CASSARO ([id number]) Visit Report for 02/06/2018 Arrival Information Details Patient Name: [name], [name] Date of Service: 02/06/2018 8:00 AM Medical Record Number: [id number] Patient Account Number: [id number] Date of Birth/Sex: July 15, 1961 (57 y.o. Female) Treating RN: Carolyne Fiscal, Debi Primary Care Tomekia Helton: Lavera Guise Other Clinician: Referring Brooklynne Pereida: Lavera Guise Treating Genell Thede/Extender: Melburn Hake, HOYT Weeks in Treatment: 2 Visit Information History Since Last Visit All ordered tests and consults were completed: No Patient Arrived: Wheel Chair Added or deleted any medications: No Arrival Time: 08:13 Any new allergies or adverse reactions: No Accompanied By: self Had a fall or experienced change in No Transfer Assistance: EasyPivot Patient activities of daily living that may affect Lift risk of falls: Patient Identification Verified: Yes Signs or symptoms of abuse/neglect since last visito No Secondary Verification Process Yes Hospitalized since last visit: No Completed: Has Dressing in Place as Prescribed: Yes Patient Requires Transmission-Based No Precautions: Pain Present Now: No Patient Has Alerts: Yes Patient Alerts: DM II Electronic Signature(s) Signed: 02/06/2018 4:46:02 PM By: Alric Quan Entered By: Alric Quan on 02/06/2018 08:14:48 Burmester, Isadore Carlean Jews ([id number]) -------------------------------------------------------------------------------- Encounter Discharge Information Details Patient Name: [name] Date of Service: 02/06/2018 8:00 AM Medical Record Number: [id number] Patient Account Number: [id number] Date of Birth/Sex: [birth year] (57 y.o. Female) Treating RN: Carolyne Fiscal, Debi Primary Care Joylene Wescott: Lavera Guise Other Clinician: Referring Ardice Boyan: Lavera Guise Treating Athanasios Heldman/Extender: Melburn Hake, HOYT Weeks in Treatment: 23 Encounter Discharge Information Items Discharge Pain Level: 0 Discharge Condition: Stable Ambulatory  Status: Wheelchair Discharge Destination: Home Transportation: Other Accompanied By: self Schedule Follow-up Appointment: Yes Medication Reconciliation completed and No provided to Patient/Care Timothy Trudell: Patient Clinical Summary of Care: Declined Electronic Signature(s) Signed: 02/06/2018 4:30:32 PM By: Ruthine Dose Entered By: Ruthine Dose on 02/06/2018 08:56:38 [name], [name] ([id number]) -------------------------------------------------------------------------------- Lower Extremity Assessment Details Patient Name: [name] Date of Service: 02/06/2018 8:00 AM Medical Record Number: [id number] Patient Account Number: [id number] Date of Birth/Sex: [birth year] (56 y.o. Female) Treating RN: Carolyne Fiscal, Debi Primary Care Sohil Timko: Lavera Guise Other Clinician: Referring Ryka Beighley: Lavera Guise Treating Criss Pallone/Extender: Melburn Hake, HOYT Weeks in Treatment: 71 Electronic Signature(s) Signed: 02/06/2018 8:28:10 AM By: Alric Quan Entered By: Alric Quan on 02/06/2018 08:28:10 Head, [name] ([id number]) -------------------------------------------------------------------------------- Multi Wound Chart Details Patient Name: [name] Date of Service: 02/06/2018 8:00 AM Medical Record Number: [id number] Patient Account Number: [id number] Date of Birth/Sex: 10-Jun-1961 (57 y.o. Female) Treating RN: Carolyne Fiscal, Debi Primary Care Aleyah Balik: Lavera Guise Other Clinician: Referring Cassius Cullinane: Lavera Guise Treating Contrell Ballentine/Extender: Melburn Hake, HOYT Weeks in Treatment: 71 Vital Signs Height(in): 63 Pulse(bpm): 87 Weight(lbs): 257 Blood Pressure(mmHg): 129/70 Body Mass Index(BMI): 46 Temperature(F): 98.4 Respiratory Rate 18 (breaths/min): Photos: [3:No Photos] [N/A:N/A] Wound Location: [3:Right Gluteal fold] [N/A:N/A] Wounding Event: [3:Pressure Injury] [N/A:N/A] Primary Etiology: [3:Pressure Ulcer] [N/A:N/A] Comorbid History: [3:Asthma, Hypertension, Type II  Diabetes, Neuropathy] [N/A:N/A] Date Acquired: [3:04/21/2016] [N/A:N/A] Weeks of Treatment: [3:71] [N/A:N/A] Wound Status: [3:Open] [N/A:N/A] Measurements L x W x D [3:0.5x0.5x1.8] [N/A:N/A] (cm) Area (cm) : [3:0.196] [N/A:N/A] Volume (cm) : [3:0.353] [N/A:N/A] % Reduction in Area: [3:93.60%] [N/A:N/A] % Reduction in Volume: [3:97.90%] [N/A:N/A] Classification: [3:Category/Stage IV] [N/A:N/A] Exudate Amount: [3:Medium] [N/A:N/A] Exudate Type: [3:Serous] [N/A:N/A] Exudate Color: [3:amber] [N/A:N/A] Wound Margin: [3:Distinct, outline attached] [N/A:N/A] Granulation Amount: [3:Large (67-100%)] [N/A:N/A] Granulation Quality: [3:Pink] [N/A:N/A] Necrotic Amount: [3:Small (1-33%)] [N/A:N/A] Exposed Structures: [3:Fascia: No Fat Layer (Subcutaneous Tissue) Exposed: No Tendon: No Muscle: No Joint: No Bone: No] [N/A:N/A] Epithelialization: [3:Small (1-33%)] [N/A:N/A] Periwound Skin Texture: [3:No Abnormalities Noted] [N/A:N/A] Periwound Skin Moisture: [3:Maceration:  Yes] [N/A:N/A] Periwound Skin Color: [3:No Abnormalities Noted] [N/A:N/A] Temperature: [3:No Abnormality] [N/A:N/A] Tenderness on Palpation: [3:Yes] [N/A:N/A] Wound Preparation: [N/A:N/A] Ulcer Cleansing: Rinsed/Irrigated with Saline Topical Anesthetic Applied: Other: lidocaine 4% Treatment Notes Electronic Signature(s) Signed: 02/06/2018 8:28:23 AM By: Alric Quan Entered By: Alric Quan on 02/06/2018 08:28:22 [name], [name] ([id number]) -------------------------------------------------------------------------------- [place] Details Patient Name: [name] Date of Service: 02/06/2018 8:00 AM Medical Record Number: [id number] Patient Account Number: [id number] Date of Birth/Sex: 05-Oct-1961 (56 y.o. Female) Treating RN: Carolyne Fiscal, Debi Primary Care Boris Engelmann: Lavera Guise Other Clinician: Referring Cejay Cambre: Lavera Guise Treating Amberrose Friebel/Extender: Melburn Hake, HOYT Weeks in Treatment:  65 Active Inactive ` Abuse / Safety / Falls / Self Care Management Nursing Diagnoses: Potential for falls Goals: Patient will remain injury free Date Initiated: 09/21/2016 Target Resolution Date: 03/25/2017 Goal Status: Active Interventions: Assess fall risk on admission and as needed Notes: ` Nutrition Nursing Diagnoses: Imbalanced nutrition Potential for alteratiion in Nutrition/Potential for imbalanced nutrition Goals: Patient/caregiver agrees to and verbalizes understanding of need to use nutritional supplements and/or vitamins as prescribed Date Initiated: 09/21/2016 Target Resolution Date: 03/25/2017 Goal Status: Active Patient/caregiver verbalizes understanding of need to maintain therapeutic glucose control per primary care physician Date Initiated: 09/21/2016 Target Resolution Date: 03/25/2017 Goal Status: Active Interventions: Assess patient nutrition upon admission and as needed per policy Notes: ` Orientation to the Wound Care Program Nursing Diagnoses: Knowledge deficit related to the wound healing center program Goals: Patient/caregiver will verbalize understanding of the [place], [place] ([id number]) Date Initiated: 09/21/2016 Target Resolution Date: 03/25/2017 Goal Status: Active Interventions: Provide education on orientation to the wound center Notes: ` Pain, Acute or Chronic Nursing Diagnoses: Pain, acute or chronic: actual or potential Potential alteration in comfort, pain Goals: Patient will verbalize adequate pain control and receive pain control interventions during procedures as needed Date Initiated: 09/21/2016 Target Resolution Date: 03/25/2017 Goal Status: Active Patient/caregiver will verbalize adequate pain control between visits Date Initiated: 09/21/2016 Target Resolution Date: 03/25/2017 Goal Status: Active Patient/caregiver will verbalize comfort level met Date Initiated: 09/21/2016 Target Resolution Date:  03/25/2017 Goal Status: Active Interventions: Assess comfort goal upon admission Complete pain assessment as per visit requirements Notes: ` Wound/Skin Impairment Nursing Diagnoses: Impaired tissue integrity Goals: Ulcer/skin breakdown will have a volume reduction of 30% by week 4 Date Initiated: 09/21/2016 Target Resolution Date: 03/25/2017 Goal Status: Active Ulcer/skin breakdown will have a volume reduction of 50% by week 8 Date Initiated: 09/21/2016 Target Resolution Date: 03/25/2017 Goal Status: Active Ulcer/skin breakdown will have a volume reduction of 80% by week 12 Date Initiated: 09/21/2016 Target Resolution Date: 03/25/2017 Goal Status: Active Interventions: Assess ulceration(s) every visit Notes: [name], [name] ([id number]) Electronic Signature(s) Signed: 02/06/2018 8:28:17 AM By: Alric Quan Entered By: Alric Quan on 02/06/2018 08:28:17 [name], Katelyn [facility]. ([id number]) -------------------------------------------------------------------------------- Pain Assessment Details Patient Name: [name] Date of Service: 02/06/2018 8:00 AM Medical Record Number: [id number] Patient Account Number: [id number] Date of Birth/Sex: 1961-04-24 (57 y.o. Female) Treating RN: Carolyne Fiscal, Debi Primary Care Zonnique Norkus: Lavera Guise Other Clinician: Referring Darsha Zumstein: Lavera Guise Treating Jameria Bradway/Extender: Melburn Hake, HOYT Weeks in Treatment: 2 Active Problems Location of Pain Severity and Description of Pain Patient Has Paino No Site Locations Pain Management and Medication Current Pain Management: Electronic Signature(s) Signed: 02/06/2018 4:46:02 PM By: Alric Quan Entered By: Alric Quan on 02/06/2018 08:14:56 [name], [name] ([id number]) -------------------------------------------------------------------------------- Patient/Caregiver Education Details Patient Name: [name] Date of Service: 02/06/2018 8:00 AM Medical Record Number: [id number] Patient  Account Number: [id number] Date of Birth/Gender: 1961-01-11 (57 y.o. Female) Treating RN: Carolyne Fiscal, Debi Primary Care Physician: Lavera Guise Other Clinician: Referring Physician: Lavera Guise Treating Physician/Extender: Sharalyn Ink in Treatment: 65 Education Assessment Education Provided To: Patient Education Topics Provided Wound/Skin Impairment: Handouts: Caring for Your Ulcer, Other: change dressing as ordered Methods: Demonstration, Explain/Verbal Responses: State content correctly Electronic Signature(s) Signed: 02/06/2018 4:46:02 PM By: Alric Quan Entered By: Alric Quan on 02/06/2018 08:32:27 [name], [name] ([id number]) -------------------------------------------------------------------------------- Wound Assessment Details Patient Name: [name] Date of Service: 02/06/2018 8:00 AM Medical Record Number: [id number] Patient Account Number: [id number] Date of Birth/Sex: [birth year] (57 y.o. Female) Treating RN: Carolyne Fiscal, Debi Primary Care Ladoris Lythgoe: Lavera Guise Other Clinician: Referring Arabia Nylund: Lavera Guise Treating Destynee Stringfellow/Extender: Melburn Hake, HOYT Weeks in Treatment: 71 Wound Status Wound Number: 3 Primary Pressure Ulcer Etiology: Wound Location: Right Gluteal fold Wound Status: Open Wounding Event: Pressure Injury Comorbid Asthma, Hypertension, Type II Diabetes, Date Acquired: 04/21/2016 History: Neuropathy Weeks Of Treatment: 71 Clustered Wound: No Photos Photo Uploaded By: Alric Quan on 02/06/2018 16:38:51 Wound Measurements Length: (cm) 0.5 Width: (cm) 0.5 Depth: (cm) 1.8 Area: (cm) 0.196 Volume: (cm) 0.353 % Reduction in Area: 93.6% % Reduction in Volume: 97.9% Epithelialization: Small (1-33%) Tunneling: No Undermining: No Wound Description Classification: Category/Stage IV Wound Margin: Distinct, outline attached Exudate Amount: Medium Exudate Type: Serous Exudate Color: amber Foul Odor After Cleansing:  No Slough/Fibrino Yes Wound Bed Granulation Amount: Large (67-100%) Exposed Structure Granulation Quality: Pink Fascia Exposed: No Necrotic Amount: Small (1-33%) Fat Layer (Subcutaneous Tissue) Exposed: No Necrotic Quality: Adherent Slough Tendon Exposed: No Muscle Exposed: No Joint Exposed: No Bone Exposed: No Periwound Skin Texture [name], [name] ([id number]) Texture Color No Abnormalities Noted: No No Abnormalities Noted: No Moisture Temperature / Pain No Abnormalities Noted: No Temperature: No Abnormality Maceration: Yes Tenderness on Palpation: Yes Wound Preparation Ulcer Cleansing: Rinsed/Irrigated with Saline Topical Anesthetic Applied: Other: lidocaine 4%, Treatment Notes Wound #3 (Right Gluteal fold) 1. Cleansed with: Clean wound with Normal Saline 2. Anesthetic Topical Lidocaine 4% cream to wound bed prior to debridement 3. Peri-wound Care: Skin Prep 4. Dressing Applied: Hydrogel Other dressing (specify in notes) 5. Secondary [place] Notes cutimed Wellsite geologist) Signed: 02/06/2018 4:46:02 PM By: Alric Quan Entered By: Alric Quan on 02/06/2018 08:24:41 [name], [name] ([id number]) -------------------------------------------------------------------------------- [place] Details Patient Name: [name] Date of Service: 02/06/2018 8:00 AM Medical Record Number: [id number] Patient Account Number: [id number] Date of Birth/Sex: [birth year] (56 y.o. Female) Treating RN: Carolyne Fiscal, Debi Primary Care Luken Shadowens: Lavera Guise Other Clinician: Referring Salman Wellen: Lavera Guise Treating Oluwasemilore Bahl/Extender: Melburn Hake, HOYT Weeks in Treatment: 71 Vital Signs Time Taken: 08:15 Temperature (F): 98.4 Height (in): 63 Pulse (bpm): 87 Weight (lbs): 257 Respiratory Rate (breaths/min): 18 Body Mass Index (BMI): 45.5 Blood Pressure (mmHg): 129/70 Reference Range: 80 - 120 mg / dl Electronic Signature(s) Signed:  02/06/2018 4:46:02 PM By: Alric Quan Entered By: Alric Quan on 02/06/2018 08:16:01

## 2018-02-07 NOTE — Progress Notes (Signed)
[name] ([id number]) Visit Report for 02/06/2018 Chief Complaint Document Details Patient Name: [name], [name] Date of Service: 02/06/2018 8:00 AM Medical Record Number: [id number] Patient Account Number: [id number] Date of Birth/Sex: [birth year] (57 y.o. Female) Treating RN: Ashok Cordia, Debi Primary Care Provider: Joen [name] Other Clinician: Referring Provider: Joen [name] Treating Provider/Extender: Linwood Dibbles, HOYT Weeks in Treatment: 73 Information Obtained from: Patient Chief Complaint Patient is here for follow-up of a relation of her right ischial pressure ulcer Electronic Signature(s) Signed: 02/06/2018 5:22:42 PM By: Lenda Kelp PA-C Entered By: Lenda Kelp on 02/06/2018 08:27:48 [name] ([id number]) -------------------------------------------------------------------------------- Debridement Details Patient Name: [name] Date of Service: 02/06/2018 8:00 AM Medical Record Number: [id number] Patient Account Number: [id number] Date of Birth/Sex: 24-Mar-1961 (56 y.o. Female) Treating RN: Ashok Cordia, Debi Primary Care Provider: Joen [name] Other Clinician: Referring Provider: Joen [name] Treating Provider/Extender: Linwood Dibbles, HOYT Weeks in Treatment: 71 Debridement Performed for Wound #3 Right Gluteal fold Assessment: Performed By: Physician STONE III, HOYT E., PA-C Debridement: Debridement Pre-procedure Verification/Time Yes - 08:35 Out Taken: Start Time: 08:36 Pain Control: Lidocaine 4% Topical Solution Level: Skin/Subcutaneous Tissue Total Area Debrided (L x W): 0.5 (cm) x 0.5 (cm) = 0.25 (cm) Tissue and other material Viable, Non-Viable, Exudate, Fibrin/Slough, Subcutaneous debrided: Instrument: Curette Bleeding: Minimum Hemostasis Achieved: Pressure End Time: 08:43 Procedural Pain: 0 Post Procedural Pain: 0 Response to Treatment: Procedure was tolerated well Post Debridement Measurements of Total Wound Length: (cm) 0.5 Stage:  Category/Stage IV Width: (cm) 0.5 Depth: (cm) 1.9 Volume: (cm) 0.373 Character of Wound/Ulcer Post Requires Further Debridement Debridement: Post Procedure Diagnosis Same as Pre-procedure Electronic Signature(s) Signed: 02/06/2018 4:46:02 PM By: Alejandro Mulling Signed: 02/06/2018 5:22:42 PM By: Lenda Kelp PA-C Entered By: Alejandro Mulling on 02/06/2018 08:43:56 [name], [name] ([id number]) -------------------------------------------------------------------------------- HPI Details Patient Name: [name] Date of Service: 02/06/2018 8:00 AM Medical Record Number: [id number] Patient Account Number: [id number] Date of Birth/Sex: August 28, 1961 (57 y.o. Female) Treating RN: Ashok Cordia, Debi Primary Care Provider: Joen [name] Other Clinician: Referring Provider: Joen [name] Treating Provider/Extender: Linwood Dibbles, HOYT Weeks in Treatment: 57 History of Present Illness Location: bilateral gluteal ulcerations and left popliteal ulceration with tunneling Quality: adits to intermittent aching to ischial ulcers, no discomfort to sinus tract Severity: right iscial ulcer with increased depth Duration: chronic ulcers to bilateral ischial ulcers and sinus tract Timing: The pain is intermittent in severity as far as how intense it becomes but is present all the time. Manipulationn makes this worse. Context: The wound occurred when the patient had a fall and was unconscious for about 48 hours laying on the floor and she had pressure injury at that stage. Modifying Factors: Other treatment(s) tried include:as noted below she has been seen by visiting wound care physicians or nurse practitioners and details have been noted Associated Signs and Symptoms: has yet to receive offloading cushions and mattress overlay HPI Description: 57 year old patient who was seen by visiting [name] wound care specialist for a wound on both her buttock and was found to have an unstageable wound on the right buttock  for about 2 months. I understand that she had a fall and was laying on the floor for about 48 hours before she was found and taken to the ICU and had a long injury to her gluteal area from pressure and also had broken her right humerus. She has had a right proximal humerus fracture and has been followed up with orthopedics recently. The patient has a past medical  history of type 2 diabetes mellitus, paraparesis, acute pyelonephritis, GERD, hypertension, glaucoma, chronic pain, anxiety neurosis, nicotine dependence, COPD. the patient had some debridement done and was to operative was recommended to use Silvadene dressing and offloading. She is a smoker and occasionally smokes a few cigarettes. the patient requested a second opinion for months and is here to discuss her care. 09/21/16; the patient re-presents from home today for review of 3 different wounds. I note that she was seen in the clinic here in July at which time she had bilateral buttock wounds. It was apparently suggested at that time that she use a wound VAC bridged to both wounds just near the initial tuberosity's bilaterally which she refused. The history was a bit difficult to put together. Apparently this patient became ill at the end of April of this year. She was found sitting on the floor she had apparently been for 2 days and subsequently admitted to hospital from 04/23/16 through 05/02/16 and at that point she was critically ill ultimately having sepsis secondary to UTI, nontraumatic rhabdomyolysis and diabetic ketoacidosis. She had acute renal failure and I think required ICU care including intubation. Patient states her wounds actually started at that point on the bilateral issue tuberosities however in reviewing the discharge summary from 5/8 I see no reference to wounds at that point. It did state that she had left lower extremity cellulitis however. Reviewing Epic I see no relevant x-rays. It would appear that her discharge  creatinine was within the normal range and indeed on 9/15 her creatinine has remained normal. [facility] There the wounds on her bilateral Buttocks were dressed. Only just before her discharge from the nursing facility she developed an "knot" which was interpreted as cellulitis on the posterior aspect of her left knee she was given antibiotics. Apparently sometime late in July a this actually opened and became a wound at home health care was tending to however she is still having purulent drainage coming from this and by my understanding the wound depth is actually become unmeasurable. I am not really clear about what home health has been placing in any of these wound areas. The patient states that is something with silver and it. She is not been systemically unwell no fever or chills her appetite is good. She is a diabetic poorly controlled however she states that her recent blood sugars at home have been in the low to mid 100s. 09/28/16 On evaluation today patient appears to continue to exhibit the 3 areas of ulceration that were noted previous. She did have an x-ray of the right pelvis which showed evidence of potential soft tissue infection but no obvious osteomyelitis. There was a discussion last office visit concerning the possibility of a wound VAC. Witth that being said the x-ray report suggested that an MRI may be more appropriate to further evaluate the extent. Subsequently in regard to the wound over the popliteal portion of the left lower extremity with tunneling at 12:00 the CT scan that was ordered was denied by insurance as they state [name], [name] ([id number]) the patient has not had x-rays prior to advanced imaging. Patient states that she is frustrated with the situation overall. 10/05/16 in the interval since I last saw this patient last week she has had the x-ray of the knee performed. I did review that x-ray today and  fortunately shows no evidence of osteomyelitis or other acute abnormality at this point in time. She  continues to have the opening iin the posterior left popliteal space with tracking proximal up the posterior thigh. Nothing seems to have worsened but it also seems to have not improved. The same is true in regard to the right pressure ulcer over the gluteal region which extends toward the ischium. The left gluteal pressure ulcer actually appears to be doing somewhat better my opinion there is some necrotic slough but overall this appears fairly well. She tells me thatt she has some discomfort especially when home health is helping her with dressing changes as they do not know her. At worse she rates her pain to be a 5 out of 10 right now it's more like a 1 out of 10. 10/07/16; still the patient has 3 different wound areas. She has a deep stage IV wound over her right ischial tuberosity. She is due to have an MRI next week. The wound over her left ischial tuberosity is more superficial and underwent debridement today. Finally she has a small open area in her left popliteal fossa the probes on measurably forward superiorly. Still a lot of drainage coming out of this. The CT scan that I ordered 3 weeks ago was questioned by her insurance company wanting a plain x-ray first. As I understand things result of this is nothing has been done in 3 weeks in terms of imaging the thigh and she has an MRI booked of this along with her pelvis for next week line 10/12/16; patient has a deep probing wound over the right ischiall tuberosity, stage III wound over the left visual tuberosity and a draining sinus in her left popliteal fossa. None of this much different from when I saw this 3 weeks ago. We have been using silver rope to the right ischial wound and a draining area in the left popliteal fossa. Plain silver alginate to the area on the left ischial tuberosity 10/19/16; the patient's wounds are essentially  unchanged although the area on the left lower gluteal is actually improved. Our intake nurse noted drainage from the right initial tuberosity probing wound as well as the draining area in the left popliteal fossa. Both of these were cultured. She had x-rays I think at the insistence of her insurance company on 09/23/16 x-ray of the pelvis was not particularly helpful she did have soft tissue air over the right lower pelvis although with the depth of this wound this is not surprising. An x-ray of her left knee did not show any specific abnormalities. We are still using silver alginate to these wound areas. Her MRI is booked for 10/27 10/26/16; cultures of the purulent drainage in her right initial tuberosity wound grew moderate Proteus and few staph aureus. The same organisms were cultured out of the left knee sinus tract posteriorly. The staph aureus is MRSA. I had started her on Augmentin last week I added doxycycline. The MRI of the left lower extremity and pelvis was finally done. The MRI of the femur showed subcutaneous soft tissue swelling edema fluid and myositis in the vastus lateralis muscle but no soft tissue abscess septic arthritis or osteomyelitis. MRI of the pelvis showed the left wound to be more expensive extending down to the bone there was osteomyelitis. Left hamstring tendons were also involved. No septic arthritis involving the hip. The decubitus ulcer on the right side showed no definite osteomyelitis or abscess.. The right hip wound is actually the one the probes 6 cm downward. But the MRI showing infection including osteomyelitis on the left explains  the draining sinus in the popliteal fossa on the left. She did have antibiotics in the hospitalization last time and this extended into her nursing home stay but I'm not exactly sure what antibiotics and for what duration. According the patient this did include vancomycin with considerable effort of our staff we are able to get the  patient into see Dr. Sampson Goon today. There were transportation difficulties. Her mother had open heart surgery and is in the ICU in [place] therefore her brother was unable to transport. Dr. Jarrett Ables office graciously arranged time to see her today. From my point of view she is going to require IV vancomycin plus perhaps a third generation cephalosporin. I plan to keep her on doxycycline and Augmentin until the IV antibiotics can be arranged. 11/02/2016 - [name] presents today for management of ulcers; She saw Dr. 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 She is to have the PICC line placed today and receive the initial dose for both antibiotics today. She has yet to receive the offloading chair cushion and/or mattress overlay from home health, apparently this has been a 3 week process. I encouraged her to speak to home health regarding this matter, along with offering home health to contact the wouns care center with any questions or concerns. The left ischial pressure ulcer continues to imporve, while to right ischial ulcer has increased in depth. The popliteal fossa sinus tract remains unmeasurable due to the limitation of depth measurement (tract extends beyond our measuring devices). 11-16-2016 [name] presents today for evaluation and management of bilateral ischial stage IV pressure ulcers and sinus tract to the left popliteal fossa. she is under the care of Dr. Sampson Goon for IV antibiotic therapy; she states that the vancomycin was placed on hold and will be restarted at a lower dose based on her renal function. She continues taking Zosyn in addition to the vancomycin. She also states that she has yet to receive offloading cushions from home health, according to her she does not qualify for these offloading cushions because "the ulcers are unstageable ". We will contact the home health  agency today to lend clarity regarding her pressure ulcers. The left popliteal fossa sinus tract continues to be a measurable as it extends beyond the length of our measuring devices.. 11/30/16; the patient is still on vancomycin and Zosyn. The depth of the draining sinus behind her left popliteal fossa is down to [name], [name] ([id number]) 4 cm although there is still serosanguineous drainage coming out of this. She saw Dr. Sampson Goon of infectious disease yesterday the idea is to weeks more of IV antibiotics and then oral antibiotics although I have not read his note. The area on the left gluteal fold is just about healed. She has a 6 cm draining sinus over the right initial tuberosity although I cannot feel bone at the base of this. As far as the patient is aware she has not had a recheck of her inflammatory markers. 12/07/16; patient is on vancomycin and Zosyn appointment with Dr. Sampson Goon on the 19th at which point the patient expects to have a change in antibiotics. Remarkable improvement over the wound over the left ischial tuberosity which is just about closed. The draining sinus in her popliteal fossa has 0.4 cm in depth. The area on the right ischial tuberosity still probes down 7 cm. This is closed and overall wound dimensions but not depth. 12/14/16; patient is completing her  vancomycin and Zosyn and per her she is going to transition to Bactrim and Augmentin for another 3 weeks. The area on her left gluteal fold is closed except for some skin tears. The area behind her left knee is no longer has any depth. The only remaining area that is of clinical concern is on the right gluteal fold probing towards the right ischial tuberosity. Today this measures 6.9 cm in depth. Very gritty surface 12/21/16; patient is now on Bactrim and Augmentin as directed by infectious disease. This should be for another 2 weeks. The area in her left gluteal fold and left popliteal are closed over and fully  healed. Measurements today at 7 cm in the right buttock wound is unchanged from last week. 12/27/16; patient is on Bactrim and Augmentin for another week as directed by infectious disease. She has completed her IV antibiotics. She is not been systemically unwell no fever no chills. The area on her right buttock measured over 6 cm in depth. There is no palpable bone. No evidence of surrounding soft tissue infection. She is complaining of tongue irritation and has a history of thrush 01/04/17; patient is been back to see Dr. Sampson Goon, her Augmentin was stopped but he continued the Bactrim for another 3 weeks. Depth of the wound is 6.7 cm there is been no major change in either direction. She is not receive the wound VAC from home health I think because of confusion about who is supposed to provided will actually talk to the home health agency today [[facility] The net no major change. 01/18/17; patient obtained her wound VAC about 10 days ago however for some reason it was not actually put on the wound. She is therefore here for [place] to apply this I guess. No other issues are noted. She is not complaining of pain fever drainage 02/01/17; patient is here now having the wound VAC or 3-4 weeks to a deep pressure area over the right initial tuberosity. This measures 6 cm in depth today which is about half a centimeter better than 2 weeks ago. There is no evidence she is systemically unwell no fever no chills no pain around the area. 02/15/17; I follow this patient every 2 weeks for a deep area over the right ischial tuberosity. This measures 5.5 cm today which is a continued improvement of 1.2 cm from 1/10 and down 0.5 cm from her visit 2 weeks ago 03/01/17; continued difficult area over the right initial tuberosity using the wound VAC. Depth today of 5.1 cm which is improved. Does not appear to be a lot of drainage in the canister. Antibiotics were finally stopped by Dr. Sampson Goon bactrim[]  and inflammatory  markers have been repeated 03/15/17; fall this lady every 2 weeks for a difficult area over her lower right gluteal area/ischial tuberosity. Depth today at 4.6 cm. This is a slow but steady improvement in the depth of this wound. Although we have labeled this as a pressure ulcer there may have been an underlying infection here at one point before we saw her. She had osteomyelitis on the left extensively which is since resolved 03/29/17- patient is here for follow-up evaluation of her right ischial pressure ulcer. She continues with the wound VAC and home health. According to the nurse home health has been using less foams and appropriate and we will instruct accordingly. The patient continues to smoke, approximately 10 cigarettes a day. She has been advised to decrease that in half by her next appointment, with a goal of  complete cessation. She states her blood sugars have been consistently less than 150. She states that she spends most of her day position left lateral or prone. She does have an air mattress on her bed, she does not have an mattress for her chair, she states she cannot afford this. 04/12/17; patient is here for evaluation of her right ischial pressure ulcer. We continue to use a wound VAC with minimal improvement today the depth of this measuring 4.4 cm versus 4.6 cm 2 weeks ago. We are using a [employer] wound VAC on this area. There is not excessive drainage no pain. The patient tells me she tries to keep off this in bed but is up in the wheelchair for 2 hours a day. She is limited in no her overall ambulation but is improving and apparently is getting bilateral lower extremity braces which she hopes will improve her ability to walk independently. 05/10/17; Depth at 3.3cm. Improved 05/24/17; depth at 3.5 cm. This is not improved since last time. Not clear if they are using collagen under the foam 06/07/17; depth that 2.9 which is a slight improvement. Still using the wound VAC. 06/21/17; depth  is 2.9 cm which is exactly the same as last time. Also the appearance of this wound is completely the same. We have been using silver collagen under a wound VAC. 07/05/17 patient presents today for reevaluation concerning her right Ischial wound. She had switched insurance companies and so it does appear that the Wound VAC needs to be reauthorized which we are working on this morning. Nonetheless her wound has been saying about the same we have continued to use the silver collagen underneath the wound VAC. She has no discomfort. 07/19/17; patient follows every 2 weeks for her right if she'll wound. Since I last saw this a month ago her dimensions of come down to 2.6 cm. This is slightly down from a month ago when it was 2.9 cm in 4 months ago it was 4.6 cm. I note that there Tuccillo, [name] ([id number]) were insurance company issues with regard to the wound VAC which she apparently is not having on for several weeks now. I think it would be reasonable to change therapies here. Will use silver alginate rope 08/02/17 on evaluation today patient's wound actually appears to be doing significantly better in regard to the depth as compared to her previous evaluation. She is having no discomfort. Unfortunately she never got the Wound VAC reapproved following the insurance issues from several weeks ago. With that being said it does not appear that she needs to requires the Wound VAC anymore and in fact I feel that she is doing better and making greater improvements at this point without it. Fortunately she has no nausea, vomiting, diarrhea, fevers, or chills. 08/16/17; patient's wound depth down to 2.3 cm. She is using silver alginate packing rope. Note that her wound VAC was discontinued due to insurance issues. This is not particularly surprising. She has not been systemically unwell and has no other new complaints 08/30/17; no change in depth. Still at 2.3 cm. Still with the same gritty surface requiring  debridement. I've been using silver alginate for quite a period of time although this came down nicely in the last month 10/04/17; the depth of this is 2 cm however with careful inspection under high-intensity light most of the walls of this small probing sinus seemed to be normally epithelialized. I cannot exactly see the base of this however there is been no drainage.  We've been using silver alginate there is no drainage on the dressing when it is removed. 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 There is no evidence of surrounding infection or pain. The patient had many review weakness in her bilateral legs. She is already been to see a neurologist who according the patient told her "you would never walk again" 11/01/17; this is a patient I last saw a month ago. At that point the linear tunnel in her right buttock was 2 cm. It was not possible to see the depth of this as skin had grown into the tunnel. In light of the fact that there was no drainage and no visible wound I recommended that we just allow her to go about her usual activity without dressing. She reports that almost immediately she noted drainage on her clothing although in spite of her instructions the contrary she did not come back to the clinic. She has not been specifically addressing this and is continued to no drainage without other symptoms either local or systemic 11/29/17; I follow this woman monthly. Last time the wound on her right buttock was 1.8 cm today measuring at 1.4 there has been gradual improvement in this. Concerning is the fact that the patient says that Dr. Sampson Goon of infectious disease changed her from doxycycline to Augmentin apparently because of the elevated inflammatory markers. She also said he did a culture of this area but she is not heard the results. I don't think I have his information available on care everywhere however I will check this. She is using  Hydrofera Blue rope. The patient and her intake nurse report that she is still having identifiable drainage which certainly makes it clear that this is not closed. 01/02/17 on evaluation today patient appears to be doing fairly well in regard to her left gluteal wound. She has been continuing with the [facility] Dressing's here in our office. We actually see her once a month and then subsequently are performing weekly dressing changes with Hydrofera Blue Dressing rope during nurse visits one time a week. Today there really was not much drainage at this point. However this has happened previously where she had no drainage and was essentially thought to be close internally although the external had not completely pulled together. However then she reopened and began draining again. He has been mentioned the possibility of her seeing plastic surgery to try to get this finally and completely healed. 01/18/18 she is here in follow-up evaluation for right ischial pressure ulcer. She has an appointment with [facility] plastic surgery on 2/8 for evaluation. She continues to have minimal drainage at the tip of the dressing product. She continues to smoke, admits to 8-10 cigarettes per day. No change in treatment plan 02/06/18 on evaluation today patient appears to be doing about the same in regard to her right Ischial wound. She has been tolerating the dressing changes without complication. She did have her appointment at [facility] plastic surgery she was not really impressed. They stated that due to her weight they were unable to perform a flap and subsequently recommended the only thing they could attempt would be to exercise around the ulcer and then attempt to suture it together. The question is whether this would be effective or not. Patient really is not wanting to proceed down that road. Nonetheless she really have not changed much in regard to her wound measurements. Electronic Signature(s) Signed: 02/06/2018  5:22:42 PM By: Lenda Kelp PA-C Entered  By: Lenda Kelp on 02/06/2018 08:54:26 Rosamond, [name] ([id number]) -------------------------------------------------------------------------------- Physical Exam Details Patient Name: SHAWNAY, BRAMEL Date of Service: 02/06/2018 8:00 AM Medical Record Number: [id number] Patient Account Number: [id number] Date of Birth/Sex: [birth year] (57 y.o. Female) Treating RN: Ashok Cordia, Debi Primary Care Provider: Joen [name] Other Clinician: Referring Provider: Joen [name] Treating Provider/Extender: Linwood Dibbles, HOYT Weeks in Treatment: 47 Constitutional Well-nourished and well-hydrated in no acute distress. Respiratory normal breathing without difficulty. Psychiatric this patient is able to make decisions and demonstrates good insight into disease process. Alert and Oriented x 3. pleasant and cooperative. Notes Patient's wound shows fairly thick and skin along the boarder of her wound opening which is significantly small. She does actually have some Slough noted in the base of the wound which I did sharply debride today. I did also debride the skin around in order to attempt to both clean and excise some of the epithelium that really does not need to be there so that hopefully proper healing can occur as far as filling in. Subsequently also wanted to stimulate her body to hopefully come start trying to heal this area as far as granulation in word. Patient was in agreement with this plan and did consent to the procedure. There was some bleeding during the prescriptive procedure but this was controlled with pressure. She had no pain. Electronic Signature(s) Signed: 02/06/2018 5:22:42 PM By: Lenda Kelp PA-C Entered By: Lenda Kelp on 02/06/2018 08:55:39 Wiles, [name] ([id number]) -------------------------------------------------------------------------------- Physician Orders Details Patient Name: [name] Date of Service: 02/06/2018 8:00  AM Medical Record Number: [id number] Patient Account Number: [id number] Date of Birth/Sex: Mar 15, 1961 (56 y.o. 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 Anesthetic (add to Medication List) Wound #3 Right Gluteal fold o Topical Lidocaine 4% cream applied to wound bed prior to debridement (In Clinic Only). Skin Barriers/Peri-Wound Care Wound #3 Right Gluteal fold o Skin Prep Primary Wound Dressing Wound #3 Right Gluteal fold o Hydrogel o Pack wound with: - cuitmed sorbact heavily coat with hydrogel Secondary Dressing Wound #3 Right Gluteal fold o [place] Dressing Change Frequency Wound #3 Right Gluteal fold o Change dressing every week Follow-up Appointments o Return Appointment in 2 weeks. - MD appts opposites weeks o Nurse Visit as needed - opposite weeks Off-Loading Wound #3 Right Gluteal fold o Turn and reposition every 2 hours Additional Orders / Instructions Wound #3 Right Gluteal fold o Increase protein intake. Mysliwiec, Fedra LMarland Kitchen ([id number]) Patient Medications Allergies: Biaxin Notifications Medication Indication Start End lidocaine DOSE 1 - topical 4 % cream - 1 cream topical Electronic Signature(s) Signed: 02/06/2018 4:46:02 PM By: Alejandro Mulling Signed: 02/06/2018 5:22:42 PM By: Lenda Kelp PA-C Entered By: Alejandro Mulling on 02/06/2018 08:44:48 Valdez, [name] ([id number]) -------------------------------------------------------------------------------- Prescription  02/06/2018 Patient Name: [name] Provider: Lenda Kelp PA-C Date of Birth: [birth year] NPI#: [id number] Sex: F DEA#: [id number] Phone #: [phone] License #: Patient Address: [facility] [address] [facility] [place], [place] [address] [address], [address] [place], [place] [address] [phone] Allergies Biaxin Medication Medication:  Route: Strength: Form: lidocaine 4 % topical cream topical 4% cream Class: TOPICAL LOCAL ANESTHETICS Dose: Frequency / Time: Indication: 1 1 cream topical Number of Refills: Number of Units: 0 Generic Substitution: Start Date: End Date: One Time Use: Substitution Permitted No Note to Pharmacy: Signature(s): Date(s): Electronic Signature(s) Signed: 02/06/2018 4:46:02 PM By: Alejandro Mulling Signed: 02/06/2018 5:22:42 PM By: Lenda Kelp PA-C Entered By: Alejandro Mulling on 02/06/2018 08:44:48 Ciocca, Lashandra L. ([id number]) --------------------------------------------------------------------------------  Problem List Details Patient Name: [name] Date of Service: 02/06/2018 8:00 AM Medical Record Number: [id number] Patient Account Number: [id number] Date of Birth/Sex: Apr 24, 1961 (57 y.o. 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 09/21/2016 09/21/2016 Resolved Problems ICD-10 Code Description  Active Date Resolved Date L89.323 Pressure ulcer of left buttock, stage 3 09/21/2016 09/21/2016 L89.324 Pressure ulcer of left buttock, stage 4 11/16/2016 11/16/2016 Electronic Signature(s) Signed: 02/06/2018 5:22:42 PM By: Lenda Kelp PA-C Entered By: Lenda Kelp on 02/06/2018 08:27:38 Nabozny, Darci Elbert Ewings ([id number]) -------------------------------------------------------------------------------- Progress Note Details Patient Name: [name] Date of Service: 02/06/2018 8:00 AM Medical Record Number: [id number] Patient Account Number: [id number] Date of Birth/Sex: 1961-05-02 (56 y.o. Female) Treating RN: Ashok Cordia, Debi Primary Care Provider: Joen [name] Other Clinician: Referring Provider: Joen [name] Treating Provider/Extender: Linwood Dibbles, HOYT Weeks in Treatment: 42 Subjective Chief Complaint Information obtained from Patient Patient is here for follow-up of a relation of her right ischial pressure ulcer History of Present Illness (HPI) The following HPI elements were documented for the patient's wound: Location: bilateral gluteal ulcerations and left popliteal ulceration with tunneling Quality: adits to intermittent aching to ischial ulcers, no discomfort to sinus tract Severity: right iscial ulcer with increased depth Duration: chronic ulcers to bilateral ischial ulcers and sinus tract Timing: The pain is intermittent in severity as far as how intense it becomes but is present all the time. Manipulationn makes this worse. Context: The wound occurred when the patient had a fall and was unconscious for about 48 hours laying on the floor and she had pressure injury at that stage. Modifying Factors: Other treatment(s) tried include:as noted below she has been seen by visiting wound care physicians or nurse practitioners and details have been noted Associated Signs and Symptoms: has yet to receive offloading cushions and mattress overlay 57 year old patient who was seen by  visiting [name] wound care specialist for a wound on both her buttock and was found to have an unstageable wound on the right buttock for about 2 months. I understand that she had a fall and was laying on the floor for about 48 hours before she was found and taken to the ICU and had a long injury to her gluteal area from pressure and also had broken her right humerus. She has had a right proximal humerus fracture and has been followed up with orthopedics recently. The patient has a past medical history of type 2 diabetes mellitus, paraparesis, acute pyelonephritis, GERD, hypertension, glaucoma, chronic pain, anxiety neurosis, nicotine dependence, COPD. the patient had some debridement done and was to operative was recommended to use Silvadene dressing  and offloading. She is a smoker and occasionally smokes a few cigarettes. the patient requested a second opinion for months and is here to discuss her care. 09/21/16; the patient re-presents from home today for review of 3 different wounds. I note that she was seen in the clinic here in July at which time she had bilateral buttock wounds. It was apparently suggested at that time that she use a wound VAC bridged to both wounds just near the initial tuberosity's bilaterally which she refused. The history was a bit difficult to put together. Apparently this patient became ill at the end of April of this year. She was found sitting on the floor she had apparently been for 2 days and subsequently admitted to hospital from 04/23/16 through 05/02/16 and at that point she was critically ill ultimately having sepsis secondary to UTI, nontraumatic rhabdomyolysis and diabetic ketoacidosis. She had acute renal failure and I think required ICU care including intubation. Patient states her wounds actually started at that point on the bilateral issue tuberosities however in reviewing the discharge summary from 5/8 I see no reference to wounds at that point. It did state  that she had left lower extremity cellulitis however. Reviewing Epic I see no relevant x-rays. It would appear that her discharge creatinine was within the normal range and indeed on 9/15 her creatinine has remained normal. [facility] There the wounds on her bilateral Buttocks were dressed. Only just before her discharge from the nursing facility she developed an "knot" which was interpreted as cellulitis on the posterior aspect of her left knee she was given antibiotics. Apparently sometime late in July a this actually opened and became a wound at home health care was tending to however she is still having purulent drainage coming from this and by my understanding the wound depth is actually become unmeasurable. I am not really clear about what home health has been placing in any of these wound areas. The patient states that is something with silver and it. She is not been Castello, Breshay L. ([id number]) systemically unwell no fever or chills her appetite is good. She is a diabetic poorly controlled however she states that her recent blood sugars at home have been in the low to mid 100s. 09/28/16 On evaluation today patient appears to continue to exhibit the 3 areas of ulceration that were noted previous. She did have an x-ray of the right pelvis which showed evidence of potential soft tissue infection but no obvious osteomyelitis. There was a discussion last office visit concerning the possibility of a wound VAC. Witth that being said the x-ray report suggested that an MRI may be more appropriate to further evaluate the extent. Subsequently in regard to the wound over the popliteal portion of the left lower extremity with tunneling at 12:00 the CT scan that was ordered was denied by insurance as they state the patient has not had x-rays prior to advanced imaging. Patient states that she is frustrated with the situation overall. 10/05/16 in the  interval since I last saw this patient last week she has had the x-ray of the knee performed. I did review that x-ray today and fortunately shows no evidence of osteomyelitis or other acute abnormality at this point in time. She continues to have the opening iin the posterior left popliteal space with tracking proximal up the posterior thigh. Nothing seems to have worsened but it also seems to have not improved. The same is true  in regard to the right pressure ulcer over the gluteal region which extends toward the ischium. The left gluteal pressure ulcer actually appears to be doing somewhat better my opinion there is some necrotic slough but overall this appears fairly well. She tells me thatt she has some discomfort especially when home health is helping her with dressing changes as they do not know her. At worse she rates her pain to be a 5 out of 10 right now it's more like a 1 out of 10. 10/07/16; still the patient has 3 different wound areas. She has a deep stage IV wound over her right ischial tuberosity. She is due to have an MRI next week. The wound over her left ischial tuberosity is more superficial and underwent debridement today. Finally she has a small open area in her left popliteal fossa the probes on measurably forward superiorly. Still a lot of drainage coming out of this. The CT scan that I ordered 3 weeks ago was questioned by her insurance company wanting a plain x-ray first. As I understand things result of this is nothing has been done in 3 weeks in terms of imaging the thigh and she has an MRI booked of this along with her pelvis for next week line 10/12/16; patient has a deep probing wound over the right ischiall tuberosity, stage III wound over the left visual tuberosity and a draining sinus in her left popliteal fossa. None of this much different from when I saw this 3 weeks ago. We have been using silver rope to the right ischial wound and a draining area in the left  popliteal fossa. Plain silver alginate to the area on the left ischial tuberosity 10/19/16; the patient's wounds are essentially unchanged although the area on the left lower gluteal is actually improved. Our intake nurse noted drainage from the right initial tuberosity probing wound as well as the draining area in the left popliteal fossa. Both of these were cultured. She had x-rays I think at the insistence of her insurance company on 09/23/16 x-ray of the pelvis was not particularly helpful she did have soft tissue air over the right lower pelvis although with the depth of this wound this is not surprising. An x-ray of her left knee did not show any specific abnormalities. We are still using silver alginate to these wound areas. Her MRI is booked for 10/27 10/26/16; cultures of the purulent drainage in her right initial tuberosity wound grew moderate Proteus and few staph aureus. The same organisms were cultured out of the left knee sinus tract posteriorly. The staph aureus is MRSA. I had started her on Augmentin last week I added doxycycline. The MRI of the left lower extremity and pelvis was finally done. The MRI of the femur showed subcutaneous soft tissue swelling edema fluid and myositis in the vastus lateralis muscle but no soft tissue abscess septic arthritis or osteomyelitis. MRI of the pelvis showed the left wound to be more expensive extending down to the bone there was osteomyelitis. Left hamstring tendons were also involved. No septic arthritis involving the hip. The decubitus ulcer on the right side showed no definite osteomyelitis or abscess.. The right hip wound is actually the one the probes 6 cm downward. But the MRI showing infection including osteomyelitis on the left explains the draining sinus in the popliteal fossa on the left. She did have antibiotics in the hospitalization last time and this extended into her nursing home stay but I'm not exactly sure what antibiotics and  for what duration. According the patient this did include vancomycin with considerable effort of our staff we are able to get the patient into see Dr. Sampson Goon today. There were transportation difficulties. Her mother had open heart surgery and is in the ICU in [place] therefore her brother was unable to transport. Dr. Jarrett Ables office graciously arranged time to see her today. From my point of view she is going to require IV vancomycin plus perhaps a third generation cephalosporin. I plan to keep her on doxycycline and Augmentin until the IV antibiotics can be arranged. 11/02/2016 - Kehlani presents today for management of ulcers; She saw Dr. 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 She is to have the PICC line placed today and receive the initial dose for both antibiotics today. She has yet to receive the offloading chair cushion and/or mattress overlay from home health, apparently this has been a 3 week process. I encouraged her to speak to home health regarding this matter, along with offering home health to contact the wouns care center with any questions or concerns. The left ischial pressure ulcer continues to imporve, while to right ischial ulcer has increased in depth. The popliteal fossa sinus tract remains unmeasurable due to the limitation of depth measurement (tract extends beyond our measuring devices). [name], YBARRA ([id number]) 11-16-2016 [name] presents today for evaluation and management of bilateral ischial stage IV pressure ulcers and sinus tract to the left popliteal fossa. she is under the care of Dr. Sampson Goon for IV antibiotic therapy; she states that the vancomycin was placed on hold and will be restarted at a lower dose based on her renal function. She continues taking Zosyn in addition to the vancomycin. She also states that she has yet to receive offloading cushions  from home health, according to her she does not qualify for these offloading cushions because "the ulcers are unstageable ". We will contact the home health agency today to lend clarity regarding her pressure ulcers. The left popliteal fossa sinus tract continues to be a measurable as it extends beyond the length of our measuring devices.. 11/30/16; the patient is still on vancomycin and Zosyn. The depth of the draining sinus behind her left popliteal fossa is down to 4 cm although there is still serosanguineous drainage coming out of this. She saw Dr. Sampson Goon of infectious disease yesterday the idea is to weeks more of IV antibiotics and then oral antibiotics although I have not read his note. The area on the left gluteal fold is just about healed. She has a 6 cm draining sinus over the right initial tuberosity although I cannot feel bone at the base of this. As far as the patient is aware she has not had a recheck of her inflammatory markers. 12/07/16; patient is on vancomycin and Zosyn appointment with Dr. Sampson Goon on the 19th at which point the patient expects to have a change in antibiotics. Remarkable improvement over the wound over the left ischial tuberosity which is just about closed. The draining sinus in her popliteal fossa has 0.4 cm in depth. The area on the right ischial tuberosity still probes down 7 cm. This is closed and overall wound dimensions but not depth. 12/14/16; patient is completing her vancomycin and Zosyn and per her she is going to transition to Bactrim and Augmentin for another 3 weeks. The area on her left gluteal fold is closed except for some skin tears. The area behind  her left knee is no longer has any depth. The only remaining area that is of clinical concern is on the right gluteal fold probing towards the right ischial tuberosity. Today this measures 6.9 cm in depth. Very gritty surface 12/21/16; patient is now on Bactrim and Augmentin as directed by  infectious disease. This should be for another 2 weeks. The area in her left gluteal fold and left popliteal are closed over and fully healed. Measurements today at 7 cm in the right buttock wound is unchanged from last week. 12/27/16; patient is on Bactrim and Augmentin for another week as directed by infectious disease. She has completed her IV antibiotics. She is not been systemically unwell no fever no chills. The area on her right buttock measured over 6 cm in depth. There is no palpable bone. No evidence of surrounding soft tissue infection. She is complaining of tongue irritation and has a history of thrush 01/04/17; patient is been back to see Dr. Sampson Goon, her Augmentin was stopped but he continued the Bactrim for another 3 weeks. Depth of the wound is 6.7 cm there is been no major change in either direction. She is not receive the wound VAC from home health I think because of confusion about who is supposed to provided will actually talk to the home health agency today [[facility] The net no major change. 01/18/17; patient obtained her wound VAC about 10 days ago however for some reason it was not actually put on the wound. She is therefore here for [place] to apply this I guess. No other issues are noted. She is not complaining of pain fever drainage 02/01/17; patient is here now having the wound VAC or 3-4 weeks to a deep pressure area over the right initial tuberosity. This measures 6 cm in depth today which is about half a centimeter better than 2 weeks ago. There is no evidence she is systemically unwell no fever no chills no pain around the area. 02/15/17; I follow this patient every 2 weeks for a deep area over the right ischial tuberosity. This measures 5.5 cm today which is a continued improvement of 1.2 cm from 1/10 and down 0.5 cm from her visit 2 weeks ago 03/01/17; continued difficult area over the right initial tuberosity using the wound VAC. Depth today of 5.1 cm which is  improved. Does not appear to be a lot of drainage in the canister. Antibiotics were finally stopped by Dr. Sampson Goon bactrim[]  and inflammatory markers have been repeated 03/15/17; fall this lady every 2 weeks for a difficult area over her lower right gluteal area/ischial tuberosity. Depth today at 4.6 cm. This is a slow but steady improvement in the depth of this wound. Although we have labeled this as a pressure ulcer there may have been an underlying infection here at one point before we saw her. She had osteomyelitis on the left extensively which is since resolved 03/29/17- patient is here for follow-up evaluation of her right ischial pressure ulcer. She continues with the wound VAC and home health. According to the nurse home health has been using less foams and appropriate and we will instruct accordingly. The patient continues to smoke, approximately 10 cigarettes a day. She has been advised to decrease that in half by her next appointment, with a goal of complete cessation. She states her blood sugars have been consistently less than 150. She states that she spends most of her day position left lateral or prone. She does have an air mattress on her bed,  she does not have an mattress for her chair, she states she cannot afford this. 04/12/17; patient is here for evaluation of her right ischial pressure ulcer. We continue to use a wound VAC with minimal improvement today the depth of this measuring 4.4 cm versus 4.6 cm 2 weeks ago. We are using a [employer] wound VAC on this area. There is not excessive drainage no pain. The patient tells me she tries to keep off this in bed but is up in the wheelchair for 2 hours a day. She is limited in no her overall ambulation but is improving and apparently is getting bilateral lower extremity braces which she hopes will improve her ability to walk independently. 05/10/17; Depth at 3.3cm. Improved 05/24/17; depth at 3.5 cm. This is not improved since last time. Not  clear if they are using collagen under the foam 06/07/17; depth that 2.9 which is a slight improvement. Still using the wound VAC. [name], [name] ([id number]) 06/21/17; depth is 2.9 cm which is exactly the same as last time. Also the appearance of this wound is completely the same. We have been using silver collagen under a wound VAC. 07/05/17 patient presents today for reevaluation concerning her right Ischial wound. She had switched insurance companies and so it does appear that the Wound VAC needs to be reauthorized which we are working on this morning. Nonetheless her wound has been saying about the same we have continued to use the silver collagen underneath the wound VAC. She has no discomfort. 07/19/17; patient follows every 2 weeks for her right if she'll wound. Since I last saw this a month ago her dimensions of come down to 2.6 cm. This is slightly down from a month ago when it was 2.9 cm in 4 months ago it was 4.6 cm. I note that there were insurance company issues with regard to the wound VAC which she apparently is not having on for several weeks now. I think it would be reasonable to change therapies here. Will use silver alginate rope 08/02/17 on evaluation today patient's wound actually appears to be doing significantly better in regard to the depth as compared to her previous evaluation. She is having no discomfort. Unfortunately she never got the Wound VAC reapproved following the insurance issues from several weeks ago. With that being said it does not appear that she needs to requires the Wound VAC anymore and in fact I feel that she is doing better and making greater improvements at this point without it. Fortunately she has no nausea, vomiting, diarrhea, fevers, or chills. 08/16/17; patient's wound depth down to 2.3 cm. She is using silver alginate packing rope. Note that her wound VAC was discontinued due to insurance issues. This is not particularly surprising. She has not been  systemically unwell and has no other new complaints 08/30/17; no change in depth. Still at 2.3 cm. Still with the same gritty surface requiring debridement. I've been using silver alginate for quite a period of time although this came down nicely in the last month 10/04/17; the depth of this is 2 cm however with careful inspection under high-intensity light most of the walls of this small probing sinus seemed to be normally epithelialized. I cannot exactly see the base of this however there is been no drainage. We've been using silver alginate there is no drainage on the dressing when it is removed. 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 There is no evidence of surrounding infection or pain.  The patient had many review weakness in her bilateral legs. She is already been to see a neurologist who according the patient told her "you would never walk again" 11/01/17; this is a patient I last saw a month ago. At that point the linear tunnel in her right buttock was 2 cm. It was not possible to see the depth of this as skin had grown into the tunnel. In light of the fact that there was no drainage and no visible wound I recommended that we just allow her to go about her usual activity without dressing. She reports that almost immediately she noted drainage on her clothing although in spite of her instructions the contrary she did not come back to the clinic. She has not been specifically addressing this and is continued to no drainage without other symptoms either local or systemic 11/29/17; I follow this woman monthly. Last time the wound on her right buttock was 1.8 cm today measuring at 1.4 there has been gradual improvement in this. Concerning is the fact that the patient says that Dr. Sampson Goon of infectious disease changed her from doxycycline to Augmentin apparently because of the elevated inflammatory markers. She also said he did a culture of this area  but she is not heard the results. I don't think I have his information available on care everywhere however I will check this. She is using Hydrofera Blue rope. The patient and her intake nurse report that she is still having identifiable drainage which certainly makes it clear that this is not closed. 01/02/17 on evaluation today patient appears to be doing fairly well in regard to her left gluteal wound. She has been continuing with the [facility] Dressing's here in our office. We actually see her once a month and then subsequently are performing weekly dressing changes with Hydrofera Blue Dressing rope during nurse visits one time a week. Today there really was not much drainage at this point. However this has happened previously where she had no drainage and was essentially thought to be close internally although the external had not completely pulled together. However then she reopened and began draining again. He has been mentioned the possibility of her seeing plastic surgery to try to get this finally and completely healed. 01/18/18 she is here in follow-up evaluation for right ischial pressure ulcer. She has an appointment with [facility] plastic surgery on 2/8 for evaluation. She continues to have minimal drainage at the tip of the dressing product. She continues to smoke, admits to 8-10 cigarettes per day. No change in treatment plan 02/06/18 on evaluation today patient appears to be doing about the same in regard to her right Ischial wound. She has been tolerating the dressing changes without complication. She did have her appointment at [facility] plastic surgery she was not really impressed. They stated that due to her weight they were unable to perform a flap and subsequently recommended the only thing they could attempt would be to exercise around the ulcer and then attempt to suture it together. The question is whether this would be effective or not. Patient really is not wanting to proceed  down that road. Nonetheless she really have not changed much in regard to her wound measurements. Patient History [name], [name] ([id number]) Information obtained from Patient. Family History Cancer - Mother, Diabetes - Father, Hypertension - Mother,Father, Stroke - Mother, No family history of Heart Disease, Kidney Disease, Lung Disease, Seizures, Thyroid Problems. Social History Former smoker, Marital Status - Divorced, Alcohol Use -  Never, Drug Use - Prior History, Caffeine Use - Rarely. Medical History Hospitalization/Surgery History - 04/09/2016, [facility], [facility]. Review of Systems (ROS) Constitutional Symptoms (General Health) Denies complaints or symptoms of Fever, Chills. Respiratory The patient has no complaints or symptoms. Cardiovascular The patient has no complaints or symptoms. Psychiatric The patient has no complaints or symptoms. Objective Constitutional Well-nourished and well-hydrated in no acute distress. Vitals Time Taken: 8:15 AM, Height: 63 in, Weight: 257 lbs, BMI: 45.5, Temperature: 98.4 F, Pulse: 87 bpm, Respiratory Rate: 18 breaths/min, Blood Pressure: 129/70 mmHg. Respiratory normal breathing without difficulty. Psychiatric this patient is able to make decisions and demonstrates good insight into disease process. Alert and Oriented x 3. pleasant and cooperative. General Notes: Patient's wound shows fairly thick and skin along the boarder of her wound opening which is significantly small. She does actually have some Slough noted in the base of the wound which I did sharply debride today. I did also debride the skin around in order to attempt to both clean and excise some of the epithelium that really does not need to be there so that hopefully proper healing can occur as far as filling in. Subsequently also wanted to stimulate her body to hopefully come start trying to heal this area as far as granulation in word. Patient was in agreement with this plan and  did consent to the procedure. There was some bleeding during the prescriptive procedure but this was controlled with pressure. She had no pain. Integumentary (Hair, Skin) Wound #3 status is Open. Original cause of wound was Pressure Injury. The wound is located on the Right Gluteal fold. The wound measures 0.5cm length x 0.5cm width x 1.8cm depth; 0.196cm^2 area and 0.353cm^3 volume. There is no tunneling [name], [name] ([id number]) or undermining noted. There is a medium amount of serous drainage noted. The wound margin is distinct with the outline attached to the wound base. There is large (67-100%) pink granulation within the wound bed. There is a small (1-33%) amount of necrotic tissue within the wound bed including Adherent Slough. The periwound skin appearance exhibited: Maceration. Periwound temperature was noted as No Abnormality. The periwound has tenderness on palpation. 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 There was a Skin/Subcutaneous Tissue Debridement ([id number]) debridement with total area of 0.25 sq cm performed by STONE III, HOYT E., PA-C. with the following instrument(s): Curette to remove Viable and Non-Viable tissue/material including Exudate, Fibrin/Slough, and Subcutaneous after achieving pain control using Lidocaine 4% Topical Solution. A time out was conducted at 08:35, prior to the start of the procedure. A Minimum amount of bleeding was controlled with Pressure. The procedure was tolerated well with a pain level of 0 throughout and a pain level of 0 following the procedure. Post Debridement Measurements: 0.5cm length x 0.5cm width x 1.9cm depth; 0.373cm^3 volume. Post debridement Stage noted as Category/Stage IV. Character of Wound/Ulcer Post  Debridement requires further debridement. Post procedure Diagnosis Wound #3: Same as Pre-Procedure Plan Wound Cleansing: Wound #3 Right Gluteal fold: Clean wound with Normal Saline. Anesthetic (add to Medication List): Wound #3 Right Gluteal fold: Topical Lidocaine 4% cream applied to wound bed prior to debridement (In Clinic Only). Skin Barriers/Peri-Wound Care: Wound #3 Right Gluteal fold: Skin Prep Primary Wound Dressing: Wound #3 Right Gluteal fold: Hydrogel Pack wound with: -  cuitmed sorbact heavily coat with hydrogel Secondary Dressing: Wound #3 Right Gluteal fold: [place] Dressing Change Frequency: Katelyn, [name]. ([id number]) Wound #3 Right Gluteal fold: Change dressing every week Follow-up Appointments: Return Appointment in 2 weeks. - MD appts opposites weeks Nurse Visit as needed - opposite weeks Off-Loading: Wound #3 Right Gluteal fold: Turn and reposition every 2 hours Additional Orders / Instructions: Wound #3 Right Gluteal fold: Increase protein intake. The following medication(s) was prescribed: lidocaine topical 4 % cream 1 1 cream topical was prescribed at facility I am going to recommend that we initiate treatment with Sorbact with hydrogel and patient is in agreement with attempting this plan. We will see were things stand in two weeks time. If she has any other concerns in the meantime she will contact the office and let us know otherwise hopefully this may stimulate the wound to start healing more appropriately. If not we may attempt treatment with silver nitrate and following weeks to see if that can be of benefit. I will reevaluate this patients condition in 2 week(s). Please see above for specific wound care measures. If anything worsens in the interim patatient will contact me for additional recommendations. Electronic Signature(s) Signed: 02/06/2018 5:22:42 PM By: Lenda Kelp PA-C Entered By: Lenda Kelp on 02/06/2018 08:58:57 Franzel,  [name] -------------------------------------------------------------------------------- ROS/PFSH Details Patient Name: [name] Date of Service: 02/06/2018 8:00 AM Medical Record Number: [id number] Patient Account Number: [id number] Date of Birth/Sex: 1961/11/25 (56 y.o. Female) Treating RN: Ashok Cordia, Debi Primary Care Provider: Joen [name] Other Clinician: Referring Provider: Joen [name] Treating Provider/Extender: Linwood Dibbles, HOYT Weeks in Treatment: 11 Information Obtained From Patient Wound History Do you currently have one or more open woundso Yes How many open wounds do you currently haveo 3 Approximately how long have you had your woundso since April 2017 How have you been treating your wound(s) until nowo silver Has your wound(s) ever healed and then re-openedo No Have you had any lab work done in the past montho No Have you tested positive for an antibiotic resistant organism (MRSA, VRE)o No Have you tested positive for osteomyelitis (bone infection)o No Have you had any tests for circulation on your legso No Have you had other problems associated with your woundso Swelling Constitutional Symptoms (General Health) Complaints and Symptoms: Negative for: Fever; Chills Eyes Medical History: Negative for: Cataracts; Glaucoma; Optic Neuritis Ear/Nose/Mouth/Throat Medical History: Negative for: Chronic sinus problems/congestion; Middle ear problems Hematologic/Lymphatic Medical History: Negative for: Anemia; Hemophilia; Human Immunodeficiency Virus; Lymphedema; Sickle Cell Disease Respiratory Complaints and Symptoms: No Complaints or Symptoms Medical History: Positive for: Asthma Negative for: Aspiration; Chronic Obstructive Pulmonary Disease (COPD); Pneumothorax; Sleep Apnea; Tuberculosis Cardiovascular Complaints and Symptoms: No Complaints or Symptoms Medical History: Positive for: Hypertension Cacciola, Braylynn L. ([id number]) Negative for: Angina;  Arrhythmia; Congestive Heart Failure; Coronary Artery Disease; Deep Vein Thrombosis; Hypotension; Myocardial Infarction; Peripheral Arterial Disease; Peripheral Venous Disease; Phlebitis; Vasculitis Gastrointestinal Medical History: Negative for: Cirrhosis ; Colitis; Crohnos; Hepatitis A; Hepatitis B; Hepatitis C Endocrine Medical History: Positive for: Type II Diabetes Negative for: Type I Diabetes Time with diabetes: 30 yrs Treated with: Insulin Blood sugar tested every day: Yes Tested : Blood sugar testing results: Breakfast: 121 Genitourinary Medical History: Negative for: End Stage Renal Disease Immunological Medical History: Negative for: Lupus Erythematosus; Raynaudos; Scleroderma Integumentary (Skin) Medical History: Negative for: History of Burn; History of pressure wounds Musculoskeletal Medical History: Negative for: Gout; Rheumatoid Arthritis; Osteoarthritis; Osteomyelitis Neurologic Medical History: Positive for: Neuropathy Negative  for: Dementia; Quadriplegia; Paraplegia; Seizure Disorder Oncologic Medical History: Negative for: Received Chemotherapy; Received Radiation Psychiatric Complaints and Symptoms: No Complaints or Symptoms Medical History: Negative for: Anorexia/bulimia; Confinement Anxiety Paterson, Sidnie L. ([id number]) Immunizations Pneumococcal Vaccine: Received Pneumococcal Vaccination: No Implantable Devices Hospitalization / Surgery History Name of Hospital Purpose of Hospitalization/Surgery Date [facility] AMS 04/09/2016 Family and Social History Cancer: Yes - Mother; Diabetes: Yes - Father; Heart Disease: No; Hypertension: Yes - Mother,Father; Kidney Disease: No; Lung Disease: No; Seizures: No; Stroke: Yes - Mother; Thyroid Problems: No; Former smoker; Marital Status - Divorced; Alcohol Use: Never; Drug Use: Prior History; Caffeine Use: Rarely; Advanced Directives: No; Patient does not want information on Advanced Directives; Living Will: No;  Medical Power of Attorney: No Physician Affirmation I have reviewed and agree with the above information. Electronic Signature(s) Signed: 02/06/2018 4:46:02 PM By: Alejandro Mulling Signed: 02/06/2018 5:22:42 PM By: Lenda Kelp PA-C Entered By: Lenda Kelp on 02/06/2018 08:55:00 Ruggieri, [name] -------------------------------------------------------------------------------- SuperBill Details Patient Name: [name] Date of Service: 02/06/2018 Medical Record Number: [id number] Patient Account Number: [id number] Date of Birth/Sex: 03/05/1961 (57 y.o. Female) Treating RN: Ashok Cordia, Debi Primary Care Provider: Joen [name] Other Clinician: Referring Provider: Joen [name] Treating Provider/Extender: Linwood Dibbles, HOYT Weeks in Treatment: 71 Diagnosis Coding ICD-10 Codes Code Description L89.314 Pressure ulcer of right buttock, stage 4 E11.42 Type 2 diabetes mellitus with diabetic polyneuropathy Facility Procedures CPT4 Code: [id number] Description: 11042 - DEB SUBQ TISSUE 20 SQ CM/< ICD-10 Diagnosis Description L89.314 Pressure ulcer of right buttock, stage 4 Modifier: Quantity: 1 Physician Procedures CPT4 Code: [id number] Description: 11042 - WC PHYS SUBQ TISS 20 SQ CM ICD-10 Diagnosis Description L89.314 Pressure ulcer of right buttock, stage 4 Modifier: Quantity: 1 Electronic Signature(s) Signed: 02/06/2018 5:22:42 PM By: Lenda Kelp PA-C Entered By: Lenda Kelp on 02/06/2018 08:59:11

## 2018-02-13 DIAGNOSIS — L89314 Pressure ulcer of right buttock, stage 4: Secondary | ICD-10-CM | POA: Diagnosis not present

## 2018-02-14 NOTE — Progress Notes (Signed)
KIMIKO, DORIN ([id number]) Visit Report for 02/13/2018 Arrival Information Details Patient Name: [name], [name] Date of Service: 02/13/2018 8:00 AM Medical Record Number: [id number] Patient Account Number: [id number] Date of Birth/Sex: Aug 22, 1961 (57 y.o. Female) Treating RN: Renne Crigler Primary Care Ciel Yanes: Joen [name] Other Clinician: Referring Khalia Gong: Joen [name] Treating Chayson Charters/Extender: Linwood Dibbles, HOYT Weeks in Treatment: 72 Visit Information History Since Last Visit All ordered tests and consults were completed: No Patient Arrived: Wheel Chair Added or deleted any medications: No Arrival Time: 08:04 Any new allergies or adverse reactions: No Accompanied By: self Had a fall or experienced change in No activities of daily living that may affect Transfer Assistance: None risk of falls: Patient Identification Verified: Yes Signs or symptoms of abuse/neglect since last visito No Secondary Verification Process Completed: Yes Hospitalized since last visit: No Patient Requires Transmission-Based No Pain Present Now: No Precautions: Patient Has Alerts: Yes Patient Alerts: DM II Electronic Signature(s) Signed: 02/13/2018 2:14:45 PM By: Renne Crigler Entered By: Renne Crigler on 02/13/2018 08:08:51 Erway, Henri Elbert Ewings ([id number]) -------------------------------------------------------------------------------- Clinic Level of Care Assessment Details Patient Name: [name] Date of Service: 02/13/2018 8:00 AM Medical Record Number: [id number] Patient Account Number: [id number] Date of Birth/Sex: 08/09/1961 (56 y.o. Female) Treating RN: Renne Crigler Primary Care Keigan Tafoya: Joen [name] Other Clinician: Referring Yug Loria: Joen [name] Treating Kaiden Pech/Extender: Linwood Dibbles, HOYT Weeks in Treatment: 72 Clinic Level of Care Assessment Items TOOL 4 Quantity Score []  - Use when only an EandM is performed on FOLLOW-UP visit 0 ASSESSMENTS - Nursing Assessment /  Reassessment []  - Reassessment of Co-morbidities (includes updates in patient status) 0 X- 1 5 Reassessment of Adherence to Treatment Plan ASSESSMENTS - Wound and Skin Assessment / Reassessment X - Simple Wound Assessment / Reassessment - one wound 1 5 []  - 0 Complex Wound Assessment / Reassessment - multiple wounds []  - 0 Dermatologic / Skin Assessment (not related to wound area) ASSESSMENTS - Focused Assessment []  - Circumferential Edema Measurements - multi extremities 0 []  - 0 Nutritional Assessment / Counseling / Intervention []  - 0 Lower Extremity Assessment (monofilament, tuning fork, pulses) []  - 0 Peripheral Arterial Disease Assessment (using hand held doppler) ASSESSMENTS - Ostomy and/or Continence Assessment and Care []  - Incontinence Assessment and Management 0 []  - 0 Ostomy Care Assessment and Management (repouching, etc.) PROCESS - Coordination of Care X - Simple Patient / Family Education for ongoing care 1 15 []  - 0 Complex (extensive) Patient / Family Education for ongoing care []  - 0 Staff obtains Chiropractor, Records, Test Results / Process Orders []  - 0 Staff telephones HHA, Nursing Homes / Clarify orders / etc []  - 0 Routine Transfer to another Facility (non-emergent condition) []  - 0 Routine Hospital Admission (non-emergent condition) []  - 0 New Admissions / Manufacturing engineer / Ordering NPWT, Apligraf, etc. []  - 0 Emergency Hospital Admission (emergent condition) X- 1 10 Simple Discharge Coordination Levey, Lareta L. ([id number]) []  - 0 Complex (extensive) Discharge Coordination PROCESS - Special Needs []  - Pediatric / Minor Patient Management 0 []  - 0 Isolation Patient Management []  - 0 Hearing / Language / Visual special needs []  - 0 Assessment of Community assistance (transportation, D/C planning, etc.) []  - 0 Additional assistance / Altered mentation []  - 0 Support Surface(s) Assessment (bed, cushion, seat, etc.) INTERVENTIONS - Wound  Cleansing / Measurement X - Simple Wound Cleansing - one wound 1 5 []  - 0 Complex Wound Cleansing - multiple wounds []  - 0 Wound Imaging (photographs - any number of  wounds) []  - 0 Wound Tracing (instead of photographs) X- 1 5 Simple Wound Measurement - one wound []  - 0 Complex Wound Measurement - multiple wounds INTERVENTIONS - Wound Dressings X - Small Wound Dressing one or multiple wounds 1 10 []  - 0 Medium Wound Dressing one or multiple wounds []  - 0 Large Wound Dressing one or multiple wounds []  - 0 Application of Medications - topical []  - 0 Application of Medications - injection INTERVENTIONS - Miscellaneous []  - External ear exam 0 []  - 0 Specimen Collection (cultures, biopsies, blood, body fluids, etc.) []  - 0 Specimen(s) / Culture(s) sent or taken to Lab for analysis []  - 0 Patient Transfer (multiple staff / / Similar devices) []  - 0 Simple Staple / Suture removal (25 or less) []  - 0 Complex Staple / Suture removal (26 or more) []  - 0 Hypo / Hyperglycemic Management (close monitor of Blood Glucose) []  - 0 Ankle / Brachial Index (ABI) - do not check if billed separately []  - 0 Vital Signs Bangura, Kayanna L. ( ) Has the patient been seen at the hospital within the last three years: Yes Total Score: 55 Level Of Care: New/Established - Level 2 Electronic Signature(s) Signed: 02/13/2018 2:14:45 PM By: Entered By: on 02/13/2018 08:22:14 Solberg, ( ) -------------------------------------------------------------------------------- Encounter Discharge Information Details Patient Name: Date of Service: 02/13/2018 8:00 AM Medical Record Number: Nurse, adult Patient Account Number: Date of Birth/Sex: 1961/08/29 (57 y.o. Female) Treating RN: Primary Care Jeziah Kretschmer: Other Clinician: Referring Yunique Dearcos: [id number] Treating Elvira Langston/Extender: 02/15/2018,  HOYT Weeks in Treatment: 87 Encounter Discharge Information Items Discharge Pain Level: 0 Discharge Condition: Stable Ambulatory Status: Wheelchair Discharge Destination: Home Private Transportation: Auto Accompanied By: self Schedule Follow-up Appointment: Yes Medication Reconciliation completed and No provided to Patient/Care Al[place]ace Dowd: Clinical Summary of Care: Electronic Signature(s) Signed: 02/13/2018 2:14:45 PM By: 02/15/2018 Entered By: [name] on 02/13/2018 08:21:42 Ouk, [name] (02/15/2018) -------------------------------------------------------------------------------- Patient/Caregiver Education Details Patient Name: [id number] Date of Service: 02/13/2018 8:00 AM Medical Record Number: 03/27/1961 Patient Account Number: [id number] Date of Birth/Gender: 08-27-1961 (56 y.o. Female) Treating RN: Joen [name] Primary Care Physician: Linwood Dibbles Other Clinician: Referring Physician: 61 Treating Physician/Extender: 02/15/2018 in Treatment: 69 Education Assessment Education Provided To: Patient Education Topics Provided Wound/Skin Impairment: Handouts: Caring for Your Ulcer Methods: Explain/Verbal Responses: State content correctly Electronic Signature(s) Signed: 02/13/2018 2:14:45 PM By: 02/15/2018 Entered By: [name] on 02/13/2018 08:21:26 Ognibene, Lamara [name] (02/15/2018) -------------------------------------------------------------------------------- Wound Assessment Details Patient Name: [id number] Date of Service: 02/13/2018 8:00 AM Medical Record Number: 03/27/1961 Patient Account Number: [id number] Date of Birth/Sex: 11-14-1961 (57 y.o. Female) Treating RN: Joen [name] Primary Care Lindsy Cerullo: Skeet Simmer Other Clinician: Referring Malin Cervini: 62 Treating Arionne Iams/Extender: 02/15/2018, HOYT Weeks in Treatment: 72 Wound Status Wound Number: 3 Primary Pressure Ulcer Etiology: Wound Location:  Right Gluteal fold Wound Status: Open Wounding Event: Pressure Injury Comorbid Asthma, Hypertension, Type II Diabetes, Date Acquired: 04/21/2016 History: Neuropathy Weeks Of Treatment: 72 Clustered Wound: No Wound Measurements Length: (cm) 0.5 Width: (cm) 0.5 Depth: (cm) 1.5 Area: (cm) 0.196 Volume: (cm) 0.295 % Reduction in Area: 93.6% % Reduction in Volume: 98.3% Epithelialization: Small (1-33%) Tunneling: No Undermining: No Wound Description Classification: Category/Stage IV Wound Margin: Distinct, outline attached Exudate Amount: Medium Exudate Type: Serous Exudate Color: amber Foul Odor After Cleansing: No Slough/Fibrino Yes Wound Bed Granulation Amount: Large (67-100%) Exposed Structure  Granulation Quality: Pink Fascia Exposed: No Necrotic Amount: Small (1-33%) Fat Layer (Subcutaneous Tissue) Exposed: Yes Necrotic Quality: Adherent Slough Tendon Exposed: No Muscle Exposed: No Joint Exposed: No Bone Exposed: No Periwound Skin Texture Texture Color No Abnormalities Noted: No No Abnormalities Noted: No Callus: No Atrophie Blanche: No Crepitus: No Cyanosis: No Excoriation: No Ecchymosis: No Induration: No Erythema: No Rash: No Hemosiderin Staining: No Scarring: No Mottled: No Pallor: No Moisture Rubor: No No Abnormalities Noted: No Dry / Scaly: No Temperature / Pain Maceration: No Temperature: No Abnormality Tenderness on Palpation: Yes [place], Astria L. ([id number]) Wound Preparation Ulcer Cleansing: Rinsed/Irrigated with Saline Topical Anesthetic Applied: Other: lidocaine 4%, Treatment Notes Wound #3 (Right Gluteal fold) 1. Cleansed with: Clean wound with Normal Saline 4. Dressing Applied: Hydrogel Other dressing (specify in notes) 5. Secondary Dressing Applied Bordered Foam Dressing Notes cutimed sorbact Electronic Signature(s) Signed: 02/13/2018 2:14:45 PM By: Renne Crigler Entered By: Renne Crigler on 02/13/2018 08:11:35

## 2018-02-20 ENCOUNTER — Encounter: Payer: BLUE CROSS/BLUE SHIELD | Admitting: Physician Assistant

## 2018-02-20 DIAGNOSIS — L89314 Pressure ulcer of right buttock, stage 4: Secondary | ICD-10-CM | POA: Diagnosis not present

## 2018-02-22 NOTE — Progress Notes (Addendum)
GIULLIANA, MCROBERTS ([id number]) Visit Report for 02/20/2018 Chief Complaint Document Details Patient Name: CARMEN, TOLLIVER Date of Service: 02/20/2018 8:00 AM Medical Record Number: [id number] Patient Account Number: [id number] Date of Birth/Sex: 1961/01/18 (57 y.o. Female) Treating RN: Ashok Cordia, Debi Primary Care Provider: Joen [name] Other Clinician: Referring Provider: Joen [name] Treating Provider/Extender: Linwood Dibbles, Birt Reinoso Weeks in Treatment: 25 Information Obtained from: Patient Chief Complaint Patient is here for follow-up of a relation of her right ischial pressure ulcer Electronic Signature(s) Signed: 02/20/2018 5:35:20 PM By: Lenda Kelp PA-C Entered By: Lenda Kelp on 02/20/2018 08:14:11 Whalley, [name] ([id number]) -------------------------------------------------------------------------------- HPI Details Patient Name: [name] Date of Service: 02/20/2018 8:00 AM Medical Record Number: [id number] Patient Account Number: [id number] Date of Birth/Sex: 1961-06-25 (56 y.o. Female) Treating RN: Ashok Cordia, Debi Primary Care Provider: Joen [name] Other Clinician: Referring Provider: Joen [name] Treating Provider/Extender: Linwood Dibbles, Kimberlyn Quiocho Weeks in Treatment: 68 History of Present Illness HPI Description: 57 year old patient who was seen by visiting [name] wound care specialist for a wound on both her buttock and was found to have an unstageable wound on the right buttock for about 2 months. I understand that she had a fall and was laying on the floor for about 48 hours before she was found and taken to the ICU and had a long injury to her gluteal area from pressure and also had broken her right humerus. She has had a right proximal humerus fracture and has been followed up with orthopedics recently. The patient has a past medical history of type 2 diabetes mellitus, paraparesis, acute pyelonephritis, GERD, hypertension, glaucoma, chronic pain, anxiety neurosis,  nicotine dependence, COPD. the patient had some debridement done and was to operative was recommended to use Silvadene dressing and offloading. She is a smoker and occasionally smokes a few cigarettes. the patient requested a second opinion for months and is here to discuss her care. 09/21/16; the patient re-presents from home today for review of 3 different wounds. I note that she was seen in the clinic here in July at which time she had bilateral buttock wounds. It was apparently suggested at that time that she use a wound VAC bridged to both wounds just near the initial tuberosity's bilaterally which she refused. The history was a bit difficult to put together. Apparently this patient became ill at the end of April of this year. She was found sitting on the floor she had apparently been for 2 days and subsequently admitted to hospital from 04/23/16 through 05/02/16 and at that point she was critically ill ultimately having sepsis secondary to UTI, nontraumatic rhabdomyolysis and diabetic ketoacidosis. She had acute renal failure and I think required ICU care including intubation. Patient states her wounds actually started at that point on the bilateral issue tuberosities however in reviewing the discharge summary from 5/8 I see no reference to wounds at that point. It did state that she had left lower extremity cellulitis however. Reviewing Epic I see no relevant x-rays. It would appear that her discharge creatinine was within the normal range and indeed on 9/15 her creatinine has remained normal. [facility] There the wounds on her bilateral Buttocks were dressed. Only just before her discharge from the nursing facility she developed an "knot" which was interpreted as cellulitis on the posterior aspect of her left knee she was given antibiotics. Apparently sometime late in July a this actually opened and became a wound at home health care  was tending to however she is still having purulent drainage coming from this and by my understanding the wound depth is actually become unmeasurable. I am not really clear about what home health has been placing in any of these wound areas. The patient states that is something with silver and it. She is not been systemically unwell no fever or chills her appetite is good. She is a diabetic poorly controlled however she states that her recent blood sugars at home have been in the low to mid 100s. 09/28/16 On evaluation today patient appears to continue to exhibit the 3 areas of ulceration that were noted previous. She did have an x-ray of the right pelvis which showed evidence of potential soft tissue infection but no obvious osteomyelitis. There was a discussion last office visit concerning the possibility of a wound VAC. Witth that being said the x-ray report suggested that an MRI may be more appropriate to further evaluate the extent. Subsequently in regard to the wound over the popliteal portion of the left lower extremity with tunneling at 12:00 the CT scan that was ordered was denied by insurance as they state the patient has not had x-rays prior to advanced imaging. Patient states that she is frustrated with the situation overall. 10/05/16 in the interval since I last saw this patient last week she has had the x-ray of the knee performed. I did review that x-ray today and fortunately shows no evidence of osteomyelitis or other acute abnormality at this point in time. She continues to have the opening iin the posterior left popliteal space with tracking proximal up the posterior thigh. Nothing seems to have worsened but it also seems to have not improved. The same is true in regard to the right pressure ulcer over the gluteal region which extends toward the ischium. The left gluteal pressure ulcer actually appears to be doing somewhat better my opinion there is some necrotic slough but overall  this appears fairly well. She tells me thatt she has some discomfort especially when home health is helping her with dressing changes as they do not know her. At worse she rates her pain to be a 5 out of 10 right now it's more like a 1 out of 10. Craigie, Nalea L. ([id number]) 10/07/16; still the patient has 3 different wound areas. She has a deep stage IV wound over her right ischial tuberosity. She is due to have an MRI next week. The wound over her left ischial tuberosity is more superficial and underwent debridement today. Finally she has a small open area in her left popliteal fossa the probes on measurably forward superiorly. Still a lot of drainage coming out of this. The CT scan that I ordered 3 weeks ago was questioned by her insurance company wanting a plain x-ray first. As I understand things result of this is nothing has been done in 3 weeks in terms of imaging the thigh and she has an MRI booked of this along with her pelvis for next week line 10/12/16; patient has a deep probing wound over the right ischiall tuberosity, stage III wound over the left visual tuberosity and a draining sinus in her left popliteal fossa. None of this much different from when I saw this 3 weeks ago. We have been using silver rope to the right ischial wound and a draining area in the left popliteal fossa. Plain silver alginate to the area on the left ischial tuberosity 10/19/16; the patient's wounds are essentially unchanged although the area on  the left lower gluteal is actually improved. Our intake nurse noted drainage from the right initial tuberosity probing wound as well as the draining area in the left popliteal fossa. Both of these were cultured. She had x-rays I think at the insistence of her insurance company on 09/23/16 x-ray of the pelvis was not particularly helpful she did have soft tissue air over the right lower pelvis although with the depth of this wound this is not surprising. An x-ray of her  left knee did not show any specific abnormalities. We are still using silver alginate to these wound areas. Her MRI is booked for 10/27 10/26/16; cultures of the purulent drainage in her right initial tuberosity wound grew moderate Proteus and few staph aureus. The same organisms were cultured out of the left knee sinus tract posteriorly. The staph aureus is MRSA. I had started her on Augmentin last week I added doxycycline. The MRI of the left lower extremity and pelvis was finally done. The MRI of the femur showed subcutaneous soft tissue swelling edema fluid and myositis in the vastus lateralis muscle but no soft tissue abscess septic arthritis or osteomyelitis. MRI of the pelvis showed the left wound to be more expensive extending down to the bone there was osteomyelitis. Left hamstring tendons were also involved. No septic arthritis involving the hip. The decubitus ulcer on the right side showed no definite osteomyelitis or abscess.. The right hip wound is actually the one the probes 6 cm downward. But the MRI showing infection including osteomyelitis on the left explains the draining sinus in the popliteal fossa on the left. She did have antibiotics in the hospitalization last time and this extended into her nursing home stay but I'm not exactly sure what antibiotics and for what duration. According the patient this did include vancomycin with considerable effort of our staff we are able to get the patient into see Dr. Sampson Goon today. There were transportation difficulties. Her mother had open heart surgery and is in the ICU in [place] therefore her brother was unable to transport. Dr. Jarrett Ables office graciously arranged time to see her today. From my point of view she is going to require IV vancomycin plus perhaps a third generation cephalosporin. I plan to keep her on doxycycline and Augmentin until the IV antibiotics can be arranged. 11/02/2016 - Avanni presents today for management  of ulcers; She saw Dr. 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 She is to have the PICC line placed today and receive the initial dose for both antibiotics today. She has yet to receive the offloading chair cushion and/or mattress overlay from home health, apparently this has been a 3 week process. I encouraged her to speak to home health regarding this matter, along with offering home health to contact the wouns care center with any questions or concerns. The left ischial pressure ulcer continues to imporve, while to right ischial ulcer has increased in depth. The popliteal fossa sinus tract remains unmeasurable due to the limitation of depth measurement (tract extends beyond our measuring devices). 11-16-2016 Ms. Malanga presents today for evaluation and management of bilateral ischial stage IV pressure ulcers and sinus tract to the left popliteal fossa. she is under the care of Dr. Sampson Goon for IV antibiotic therapy; she states that the vancomycin was placed on hold and will be restarted at a lower dose based on her renal function. She continues taking Zosyn in addition to the vancomycin.  She also states that she has yet to receive offloading cushions from home health, according to her she does not qualify for these offloading cushions because "the ulcers are unstageable ". We will contact the home health agency today to lend clarity regarding her pressure ulcers. The left popliteal fossa sinus tract continues to be a measurable as it extends beyond the length of our measuring devices.. 11/30/16; the patient is still on vancomycin and Zosyn. The depth of the draining sinus behind her left popliteal fossa is down to 4 cm although there is still serosanguineous drainage coming out of this. She saw Dr. Sampson Goon of infectious disease yesterday the idea is to weeks more of IV antibiotics and then oral  antibiotics although I have not read his note. The area on the left gluteal fold is just about healed. She has a 6 cm draining sinus over the right initial tuberosity although I cannot feel bone at the base of this. As far as the patient is aware she has not had a recheck of her inflammatory markers. 12/07/16; patient is on vancomycin and Zosyn appointment with Dr. Sampson Goon on the 19th at which point the patient expects to have a change in antibiotics. Remarkable improvement over the wound over the left ischial tuberosity which is just about closed. The draining sinus in her popliteal fossa has 0.4 cm in depth. The area on the right ischial tuberosity still probes down 7 cm. This is closed and overall wound dimensions but not depth. 12/14/16; patient is completing her vancomycin and Zosyn and per her she is going to transition to Bactrim and Augmentin for another 3 weeks. The area on her left gluteal fold is closed except for some skin tears. The area behind her left knee is no longer has any depth. The only remaining area that is of clinical concern is on the right gluteal fold probing towards the right Wichmann, Quanta L. ([id number]) ischial tuberosity. Today this measures 6.9 cm in depth. Very gritty surface 12/21/16; patient is now on Bactrim and Augmentin as directed by infectious disease. This should be for another 2 weeks. The area in her left gluteal fold and left popliteal are closed over and fully healed. Measurements today at 7 cm in the right buttock wound is unchanged from last week. 12/27/16; patient is on Bactrim and Augmentin for another week as directed by infectious disease. She has completed her IV antibiotics. She is not been systemically unwell no fever no chills. The area on her right buttock measured over 6 cm in depth. There is no palpable bone. No evidence of surrounding soft tissue infection. She is complaining of tongue irritation and has a history of thrush 01/04/17; patient  is been back to see Dr. Sampson Goon, her Augmentin was stopped but he continued the Bactrim for another 3 weeks. Depth of the wound is 6.7 cm there is been no major change in either direction. She is not receive the wound VAC from home health I think because of confusion about who is supposed to provided will actually talk to the home health agency today [[facility] The net no major change. 01/18/17; patient obtained her wound VAC about 10 days ago however for some reason it was not actually put on the wound. She is therefore here for [place] to apply this I guess. No other issues are noted. She is not complaining of pain fever drainage 02/01/17; patient is here now having the wound VAC or 3-4 weeks to a deep pressure area over the right  initial tuberosity. This measures 6 cm in depth today which is about half a centimeter better than 2 weeks ago. There is no evidence she is systemically unwell no fever no chills no pain around the area. 02/15/17; I follow this patient every 2 weeks for a deep area over the right ischial tuberosity. This measures 5.5 cm today which is a continued improvement of 1.2 cm from 1/10 and down 0.5 cm from her visit 2 weeks ago 03/01/17; continued difficult area over the right initial tuberosity using the wound VAC. Depth today of 5.1 cm which is improved. Does not appear to be a lot of drainage in the canister. Antibiotics were finally stopped by Dr. Sampson Goon bactrim[]  and inflammatory markers have been repeated 03/15/17; fall this lady every 2 weeks for a difficult area over her lower right gluteal area/ischial tuberosity. Depth today at 4.6 cm. This is a slow but steady improvement in the depth of this wound. Although we have labeled this as a pressure ulcer there may have been an underlying infection here at one point before we saw her. She had osteomyelitis on the left extensively which is since resolved 03/29/17- patient is here for follow-up evaluation of her right ischial  pressure ulcer. She continues with the wound VAC and home health. According to the nurse home health has been using less foams and appropriate and we will instruct accordingly. The patient continues to smoke, approximately 10 cigarettes a day. She has been advised to decrease that in half by her next appointment, with a goal of complete cessation. She states her blood sugars have been consistently less than 150. She states that she spends most of her day position left lateral or prone. She does have an air mattress on her bed, she does not have an mattress for her chair, she states she cannot afford this. 04/12/17; patient is here for evaluation of her right ischial pressure ulcer. We continue to use a wound VAC with minimal improvement today the depth of this measuring 4.4 cm versus 4.6 cm 2 weeks ago. We are using a [employer] wound VAC on this area. There is not excessive drainage no pain. The patient tells me she tries to keep off this in bed but is up in the wheelchair for 2 hours a day. She is limited in no her overall ambulation but is improving and apparently is getting bilateral lower extremity braces which she hopes will improve her ability to walk independently. 05/10/17; Depth at 3.3cm. Improved 05/24/17; depth at 3.5 cm. This is not improved since last time. Not clear if they are using collagen under the foam 06/07/17; depth that 2.9 which is a slight improvement. Still using the wound VAC. 06/21/17; depth is 2.9 cm which is exactly the same as last time. Also the appearance of this wound is completely the same. We have been using silver collagen under a wound VAC. 07/05/17 patient presents today for reevaluation concerning her right Ischial wound. She had switched insurance companies and so it does appear that the Wound VAC needs to be reauthorized which we are working on this morning. Nonetheless her wound has been saying about the same we have continued to use the silver collagen underneath the  wound VAC. She has no discomfort. 07/19/17; patient follows every 2 weeks for her right if she'll wound. Since I last saw this a month ago her dimensions of come down to 2.6 cm. This is slightly down from a month ago when it was 2.9 cm in 4  months ago it was 4.6 cm. I note that there were insurance company issues with regard to the wound VAC which she apparently is not having on for several weeks now. I think it would be reasonable to change therapies here. Will use silver alginate rope 08/02/17 on evaluation today patient's wound actually appears to be doing significantly better in regard to the depth as compared to her previous evaluation. She is having no discomfort. Unfortunately she never got the Wound VAC reapproved following the insurance issues from several weeks ago. With that being said it does not appear that she needs to requires the Wound VAC anymore and in fact I feel that she is doing better and making greater improvements at this point without it. Fortunately she has no nausea, vomiting, diarrhea, fevers, or chills. 08/16/17; patient's wound depth down to 2.3 cm. She is using silver alginate packing rope. Note that her wound VAC was discontinued due to insurance issues. This is not particularly surprising. She has not been systemically unwell and has no other new complaints 08/30/17; no change in depth. Still at 2.3 cm. Still with the same gritty surface requiring debridement. I've been using silver Niemann, Charma L. ([id number]) alginate for quite a period of time although this came down nicely in the last month 10/04/17; the depth of this is 2 cm however with careful inspection under high-intensity light most of the walls of this small probing sinus seemed to be normally epithelialized. I cannot exactly see the base of this however there is been no drainage. We've been using silver alginate there is no drainage on the dressing when it is removed. I'm therefore thinking that  this reminiscent sinus is probably fully epithelialized. There is no evidence of surrounding infection or pain. The patient had many review weakness in her bilateral legs. She is already been to see a neurologist who according the patient told her "you would never walk again" 11/01/17; this is a patient I last saw a month ago. At that point the linear tunnel in her right buttock was 2 cm. It was not possible to see the depth of this as skin had grown into the tunnel. In light of the fact that there was no drainage and no visible wound I recommended that we just allow her to go about her usual activity without dressing. She reports that almost immediately she noted drainage on her clothing although in spite of her instructions the contrary she did not come back to the clinic. She has not been specifically addressing this and is continued to no drainage without other symptoms either local or systemic 11/29/17; I follow this woman monthly. Last time the wound on her right buttock was 1.8 cm today measuring at 1.4 there has been gradual improvement in this. Concerning is the fact that the patient says that Dr. Sampson Goon of infectious disease changed her from doxycycline to Augmentin apparently because of the elevated inflammatory markers. She also said he did a culture of this area but she is not heard the results. I don't think I have his information available on care everywhere however I will check this. She is using Hydrofera Blue rope. The patient and her intake nurse report that she is still having identifiable drainage which certainly makes it clear that this is not closed. 01/02/17 on evaluation today patient appears to be doing fairly well in regard to her left gluteal wound. She has been continuing with the [facility] Dressing's here in our office. We actually see her  once a month and then subsequently are performing weekly dressing changes with Hydrofera Blue Dressing rope during nurse  visits one time a week. Today there really was not much drainage at this point. However this has happened previously where she had no drainage and was essentially thought to be close internally although the external had not completely pulled together. However then she reopened and began draining again. He has been mentioned the possibility of her seeing plastic surgery to try to get this finally and completely healed. 01/18/18 she is here in follow-up evaluation for right ischial pressure ulcer. She has an appointment with [facility] plastic surgery on 2/8 for evaluation. She continues to have minimal drainage at the tip of the dressing product. She continues to smoke, admits to 8-10 cigarettes per day. No change in treatment plan 02/06/18 on evaluation today patient appears to be doing about the same in regard to her right Ischial wound. She has been tolerating the dressing changes without complication. She did have her appointment at [facility] plastic surgery she was not really impressed. They stated that due to her weight they were unable to perform a flap and subsequently recommended the only thing they could attempt would be to exercise around the ulcer and then attempt to suture it together. The question is whether this would be effective or not. Patient really is not wanting to proceed down that road. Nonetheless she really have not changed much in regard to her wound measurements. 02/20/18 on evaluation today patient appears to be doing about the same in regard to her right Ischial ulcer. She has been tolerating the dressing changes without complication. Though she does not show any signs of infection at this point she has really not made any progress as far as granulating in and the depth of the wound which is what I'm most concerned with at this point. As previously discussed when we want to try silver nitrate to see if this could be of benefit for her. She has no pain. Electronic Signature(s) Signed:  02/20/2018 5:35:20 PM By: Lenda Kelp PA-C Entered By: Lenda Kelp on 02/20/2018 08:58:36 Egley, [name] ([id number]) -------------------------------------------------------------------------------- Gaynelle Adu TISS Details Patient Name: [name] Date of Service: 02/20/2018 8:00 AM Medical Record Number: [id number] Patient Account Number: [id number] Date of Birth/Sex: 01-06-1961 (56 y.o. Female) Treating RN: Ashok Cordia, Debi Primary Care Provider: Joen [name] Other Clinician: Referring Provider: Joen [name] Treating Provider/Extender: Linwood Dibbles, Yarnell Arvidson Weeks in Treatment: 73 Procedure Performed for: Wound #3 Right Gluteal fold Performed By: Physician Trellis Paganini., PA-C Post Procedure Diagnosis Same as Pre-procedure Electronic Signature(s) Signed: 02/21/2018 4:30:35 PM By: Alejandro Mulling Entered By: Alejandro Mulling on 02/20/2018 08:24:20 Lienhard, [name] ([id number]) -------------------------------------------------------------------------------- Physical Exam Details Patient Name: [name] Date of Service: 02/20/2018 8:00 AM Medical Record Number: [id number] Patient Account Number: [id number] Date of Birth/Sex: 22-Apr-1961 (57 y.o. Female) Treating RN: Ashok Cordia, Debi Primary Care Provider: Joen [name] Other Clinician: Referring Provider: Joen [name] Treating Provider/Extender: Linwood Dibbles, [name] [name] Weeks in Treatment: 58 Constitutional Obese and well-hydrated in no acute distress. Respiratory normal breathing without difficulty. Psychiatric this patient is able to make decisions and demonstrates good insight into disease process. Alert and Oriented x 3. pleasant and cooperative. Notes Patient's wound continues to show signs of epithelialization down the side of the wound edges which I think is definitely preventing healing appropriately. With that being said there is no evidence of infection and even aggressive debridement as I performed last week does  not seem to have made a significant improvement overall in the progression of the wound. Therefore I did attempt chemical cauterization with silver nitrate to see if this could be of benefit today. Patient tolerated this with no discomfort. Electronic Signature(s) Signed: 02/20/2018 5:35:20 PM By: Lenda Kelp PA-C Entered By: Lenda Kelp on 02/20/2018 08:59:38 Schowalter, [name] ([id number]) -------------------------------------------------------------------------------- Physician Orders Details Patient Name: [name] Date of Service: 02/20/2018 8:00 AM Medical Record Number: [id number] Patient Account Number: [id number] Date of Birth/Sex: [birth year] (56 y.o. 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 Anesthetic (add to Medication List) Wound #3 Right Gluteal fold o Topical Lidocaine 4% cream applied to wound bed prior to debridement (In Clinic Only). Skin Barriers/Peri-Wound Care Wound #3 Right Gluteal fold o Skin Prep Primary Wound Dressing Wound #3 Right Gluteal fold o Hydrogel o Pack wound with: - cuitmed sorbact heavily coat with hydrogel Secondary Dressing Wound #3 Right Gluteal fold o [place] Dressing Change Frequency Wound #3 Right Gluteal fold o Change dressing every week Follow-up Appointments o Return Appointment in 2 weeks. - MD appts opposites weeks o Nurse Visit as needed - opposite weeks Off-Loading Wound #3 Right Gluteal fold o Turn and reposition every 2  hours Additional Orders / Instructions Wound #3 Right Gluteal fold o Increase protein intake. Tsukamoto, Michon LMarland Kitchen ([id number]) Patient Medications Allergies: Biaxin Notifications Medication Indication Start End lidocaine DOSE 1 - topical 4 % cream - 1 cream topical Electronic Signature(s) Signed: 02/20/2018 5:35:20 PM By: Lenda Kelp PA-C Signed: 02/21/2018 4:30:35 PM By: Alejandro Mulling Entered By: Alejandro Mulling on 02/20/2018 08:22:39 Kalan, Taquita Elbert Ewings ([id number]) -------------------------------------------------------------------------------- Prescription 02/20/2018 Patient Name: [name] Provider: Lenda Kelp PA-C Date of Birth: December 25, 1961 NPI#: [id number] Sex: F DEA#: [id number] Phone #: [phone] License #: Patient Address: [facility] [address] [facility] [place], [place] [address] [address] [place], [place] [address] [phone] Allergies Biaxin Medication Medication: Route: Strength: Form: lidocaine topical 4% cream Class: TOPICAL LOCAL ANESTHETICS Dose: Frequency / Time: Indication: 1 1 cream topical Number of Refills: Number of Units: 0 Generic Substitution: Start Date: End Date: Administered at Substitution Permitted Facility: Yes Time Administered: Time Discontinued: Note to Pharmacy: Signature(s): Date(s): Electronic Signature(s) Signed: 02/20/2018 5:35:20 PM By: Lenda Kelp PA-C Signed: 02/21/2018 4:30:35 PM By: Alejandro Mulling Entered By: Alejandro Mulling on 02/20/2018 08:22:39 Nygaard, Quintella L. ([id number]) Wieting, Terriana Elbert Ewings ([id number]) --------------------------------------------------------------------------------  Problem List Details Patient Name: [name] Date of Service: 02/20/2018 8:00 AM Medical Record Number: [id number] Patient Account Number: [id number] Date of Birth/Sex: [birth year] (56 y.o. 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 ([id number]) -------------------------------------------------------------------------------- Progress Note Details Patient Name: [name] Date of Service: 02/20/2018 8:00 AM Medical Record Number: [id number] Patient Account Number: [id number] Date of Birth/Sex: [birth year] (56 y.o. Female) Treating RN: Ashok Cordia, Debi Primary Care Provider: Joen [name] Other Clinician: Referring Provider: Joen [name] Treating Provider/Extender: Linwood Dibbles, Inetha Maret Weeks in Treatment: 56 Subjective Chief Complaint Information obtained from Patient Patient is here for follow-up of a relation of her right ischial pressure ulcer History of Present Illness (HPI) 57 year old patient who was seen by visiting [name] wound care specialist for a wound on both her buttock and was found to have an unstageable wound on the right buttock for about 2 months. I understand that she had a fall and was laying on the floor for about 48 hours before she was found and taken to the ICU and had a  long injury to her gluteal area from pressure and also had broken her right humerus. She has had a right proximal humerus fracture and has been followed up with orthopedics recently. The patient has a past medical history of type 2 diabetes mellitus, paraparesis, acute pyelonephritis, GERD, hypertension, glaucoma, chronic pain, anxiety neurosis, nicotine dependence, COPD. the patient had some debridement done and was to operative was recommended to use Silvadene dressing and offloading. She is a smoker and occasionally smokes a few cigarettes. the patient requested a second opinion for months and is here to discuss her care. 09/21/16; the patient re-presents from home today for review of 3 different wounds. I note that she was seen in the clinic here in July at which time she had bilateral buttock wounds. It was apparently suggested at that time that she use a wound VAC bridged to both wounds just near the initial tuberosity's bilaterally which she refused. The history was a bit difficult to put together. Apparently this patient became ill at the end of April of this year. She was found sitting on the floor she had apparently been for 2 days and subsequently admitted to hospital from 04/23/16 through 05/02/16 and at that point she was critically ill ultimately having sepsis secondary to UTI, nontraumatic rhabdomyolysis and diabetic ketoacidosis. She had acute renal failure and I think required ICU care including intubation. Patient states her wounds actually started at that point on the bilateral issue tuberosities however in reviewing the discharge summary from 5/8 I see no reference to wounds at that point. It did state that she had left lower extremity cellulitis however. Reviewing Epic I see no relevant x-rays. It would appear that her discharge creatinine was within the normal range and indeed on 9/15 her creatinine has remained normal. [facility] There the wounds on her bilateral Buttocks were dressed. Only just before her discharge from the nursing facility she developed an "knot" which was interpreted as cellulitis on the posterior aspect of her left knee she was given antibiotics. Apparently sometime late in July a this actually opened and became a wound at home health care was tending to however she is still having purulent drainage coming from this and by my understanding the wound depth is actually become unmeasurable. I am not really clear about what  home health has been placing in any of these wound areas. The patient states that is something with silver and it. She is not been systemically unwell no fever or chills her appetite is good. She is a diabetic poorly controlled however she states that her recent blood sugars at home have been in the low to mid 100s. 09/28/16 On evaluation today patient appears to continue to exhibit the 3 areas of ulceration that were noted previous. She did have an x-ray of the right pelvis which showed evidence of potential soft tissue infection but no obvious osteomyelitis. There was a discussion last office visit concerning the possibility of a wound VAC. Witth that being said the x-ray report suggested that an MRI may be more appropriate to further evaluate the extent. Subsequently in regard to the wound over the popliteal portion of the left lower extremity with tunneling at 12:00 the CT scan that was ordered was denied by insurance as they state the patient has not had x-rays prior to advanced imaging. Patient states that she is frustrated with the situation overall. 10/05/16 in the interval since I last saw this patient last week she has had the x-ray of the knee performed. I did review that x-ray today and fortunately shows no evidence of osteomyelitis or other acute abnormality at this point in time. She continues to have the opening iin the posterior left popliteal space with  tracking proximal up the posterior thigh. Nothing seems to have Mowrer, Ahliya L. ([id number]) worsened but it also seems to have not improved. The same is true in regard to the right pressure ulcer over the gluteal region which extends toward the ischium. The left gluteal pressure ulcer actually appears to be doing somewhat better my opinion there is some necrotic slough but overall this appears fairly well. She tells me thatt she has some discomfort especially when home health is helping her with dressing changes as they do not know her. At worse she rates her pain to be a 5 out of 10 right now it's more like a 1 out of 10. 10/07/16; still the patient has 3 different wound areas. She has a deep stage IV wound over her right ischial tuberosity. She is due to have an MRI next week. The wound over her left ischial tuberosity is more superficial and underwent debridement today. Finally she has a small open area in her left popliteal fossa the probes on measurably forward superiorly. Still a lot of drainage coming out of this. The CT scan that I ordered 3 weeks ago was questioned by her insurance company wanting a plain x-ray first. As I understand things result of this is nothing has been done in 3 weeks in terms of imaging the thigh and she has an MRI booked of this along with her pelvis for next week line 10/12/16; patient has a deep probing wound over the right ischiall tuberosity, stage III wound over the left visual tuberosity and a draining sinus in her left popliteal fossa. None of this much different from when I saw this 3 weeks ago. We have been using silver rope to the right ischial wound and a draining area in the left popliteal fossa. Plain silver alginate to the area on the left ischial tuberosity 10/19/16; the patient's wounds are essentially unchanged although the area on the left lower gluteal is actually improved. Our intake nurse noted drainage from the right initial tuberosity probing  wound as well as the draining area in the left popliteal fossa.  Both of these were cultured. She had x-rays I think at the insistence of her insurance company on 09/23/16 x-ray of the pelvis was not particularly helpful she did have soft tissue air over the right lower pelvis although with the depth of this wound this is not surprising. An x-ray of her left knee did not show any specific abnormalities. We are still using silver alginate to these wound areas. Her MRI is booked for 10/27 10/26/16; cultures of the purulent drainage in her right initial tuberosity wound grew moderate Proteus and few staph aureus. The same organisms were cultured out of the left knee sinus tract posteriorly. The staph aureus is MRSA. I had started her on Augmentin last week I added doxycycline. The MRI of the left lower extremity and pelvis was finally done. The MRI of the femur showed subcutaneous soft tissue swelling edema fluid and myositis in the vastus lateralis muscle but no soft tissue abscess septic arthritis or osteomyelitis. MRI of the pelvis showed the left wound to be more expensive extending down to the bone there was osteomyelitis. Left hamstring tendons were also involved. No septic arthritis involving the hip. The decubitus ulcer on the right side showed no definite osteomyelitis or abscess.. The right hip wound is actually the one the probes 6 cm downward. But the MRI showing infection including osteomyelitis on the left explains the draining sinus in the popliteal fossa on the left. She did have antibiotics in the hospitalization last time and this extended into her nursing home stay but I'm not exactly sure what antibiotics and for what duration. According the patient this did include vancomycin with considerable effort of our staff we are able to get the patient into see Dr. Sampson Goon today. There were transportation difficulties. Her mother had open heart surgery and is in the ICU in [place]  therefore her brother was unable to transport. Dr. Jarrett Ables office graciously arranged time to see her today. From my point of view she is going to require IV vancomycin plus perhaps a third generation cephalosporin. I plan to keep her on doxycycline and Augmentin until the IV antibiotics can be arranged. 11/02/2016 - Marieanne presents today for management of ulcers; She saw Dr. 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 She is to have the PICC line placed today and receive the initial dose for both antibiotics today. She has yet to receive the offloading chair cushion and/or mattress overlay from home health, apparently this has been a 3 week process. I encouraged her to speak to home health regarding this matter, along with offering home health to contact the wouns care center with any questions or concerns. The left ischial pressure ulcer continues to imporve, while to right ischial ulcer has increased in depth. The popliteal fossa sinus tract remains unmeasurable due to the limitation of depth measurement (tract extends beyond our measuring devices). 11-16-2016 Ms. Boschee presents today for evaluation and management of bilateral ischial stage IV pressure ulcers and sinus tract to the left popliteal fossa. she is under the care of Dr. Sampson Goon for IV antibiotic therapy; she states that the vancomycin was placed on hold and will be restarted at a lower dose based on her renal function. She continues taking Zosyn in addition to the vancomycin. She also states that she has yet to receive offloading cushions from home health, according to her she does not qualify for these offloading cushions because "the ulcers are unstageable ".  We will contact the home health agency today to lend clarity regarding her pressure ulcers. The left popliteal fossa sinus tract continues to be a measurable as it extends beyond  the length of our measuring devices.. 11/30/16; the patient is still on vancomycin and Zosyn. The depth of the draining sinus behind her left popliteal fossa is down to 4 cm although there is still serosanguineous drainage coming out of this. She saw Dr. Sampson Goon of infectious disease yesterday the idea is to weeks more of IV antibiotics and then oral antibiotics although I have not read his note. The area on the left gluteal fold is just about healed. She has a 6 cm draining sinus over the right initial tuberosity although I cannot feel bone at the base of this. As far as the patient is aware she has not had a recheck of her inflammatory markers. 12/07/16; patient is on vancomycin and Zosyn appointment with Dr. Sampson Goon on the 19th at which point the patient expects Rios, Shanasia L. ([id number]) to have a change in antibiotics. Remarkable improvement over the wound over the left ischial tuberosity which is just about closed. The draining sinus in her popliteal fossa has 0.4 cm in depth. The area on the right ischial tuberosity still probes down 7 cm. This is closed and overall wound dimensions but not depth. 12/14/16; patient is completing her vancomycin and Zosyn and per her she is going to transition to Bactrim and Augmentin for another 3 weeks. The area on her left gluteal fold is closed except for some skin tears. The area behind her left knee is no longer has any depth. The only remaining area that is of clinical concern is on the right gluteal fold probing towards the right ischial tuberosity. Today this measures 6.9 cm in depth. Very gritty surface 12/21/16; patient is now on Bactrim and Augmentin as directed by infectious disease. This should be for another 2 weeks. The area in her left gluteal fold and left popliteal are closed over and fully healed. Measurements today at 7 cm in the right buttock wound is unchanged from last week. 12/27/16; patient is on Bactrim and Augmentin for another  week as directed by infectious disease. She has completed her IV antibiotics. She is not been systemically unwell no fever no chills. The area on her right buttock measured over 6 cm in depth. There is no palpable bone. No evidence of surrounding soft tissue infection. She is complaining of tongue irritation and has a history of thrush 01/04/17; patient is been back to see Dr. Sampson Goon, her Augmentin was stopped but he continued the Bactrim for another 3 weeks. Depth of the wound is 6.7 cm there is been no major change in either direction. She is not receive the wound VAC from home health I think because of confusion about who is supposed to provided will actually talk to the home health agency today [[facility] The net no major change. 01/18/17; patient obtained her wound VAC about 10 days ago however for some reason it was not actually put on the wound. She is therefore here for [place] to apply this I guess. No other issues are noted. She is not complaining of pain fever drainage 02/01/17; patient is here now having the wound VAC or 3-4 weeks to a deep pressure area over the right initial tuberosity. This measures 6 cm in depth today which is about half a centimeter better than 2 weeks ago. There is no evidence she is systemically unwell no fever no  chills no pain around the area. 02/15/17; I follow this patient every 2 weeks for a deep area over the right ischial tuberosity. This measures 5.5 cm today which is a continued improvement of 1.2 cm from 1/10 and down 0.5 cm from her visit 2 weeks ago 03/01/17; continued difficult area over the right initial tuberosity using the wound VAC. Depth today of 5.1 cm which is improved. Does not appear to be a lot of drainage in the canister. Antibiotics were finally stopped by Dr. Sampson Goon bactrim[]  and inflammatory markers have been repeated 03/15/17; fall this lady every 2 weeks for a difficult area over her lower right gluteal area/ischial tuberosity. Depth today  at 4.6 cm. This is a slow but steady improvement in the depth of this wound. Although we have labeled this as a pressure ulcer there may have been an underlying infection here at one point before we saw her. She had osteomyelitis on the left extensively which is since resolved 03/29/17- patient is here for follow-up evaluation of her right ischial pressure ulcer. She continues with the wound VAC and home health. According to the nurse home health has been using less foams and appropriate and we will instruct accordingly. The patient continues to smoke, approximately 10 cigarettes a day. She has been advised to decrease that in half by her next appointment, with a goal of complete cessation. She states her blood sugars have been consistently less than 150. She states that she spends most of her day position left lateral or prone. She does have an air mattress on her bed, she does not have an mattress for her chair, she states she cannot afford this. 04/12/17; patient is here for evaluation of her right ischial pressure ulcer. We continue to use a wound VAC with minimal improvement today the depth of this measuring 4.4 cm versus 4.6 cm 2 weeks ago. We are using a [employer] wound VAC on this area. There is not excessive drainage no pain. The patient tells me she tries to keep off this in bed but is up in the wheelchair for 2 hours a day. She is limited in no her overall ambulation but is improving and apparently is getting bilateral lower extremity braces which she hopes will improve her ability to walk independently. 05/10/17; Depth at 3.3cm. Improved 05/24/17; depth at 3.5 cm. This is not improved since last time. Not clear if they are using collagen under the foam 06/07/17; depth that 2.9 which is a slight improvement. Still using the wound VAC. 06/21/17; depth is 2.9 cm which is exactly the same as last time. Also the appearance of this wound is completely the same. We have been using silver collagen under a  wound VAC. 07/05/17 patient presents today for reevaluation concerning her right Ischial wound. She had switched insurance companies and so it does appear that the Wound VAC needs to be reauthorized which we are working on this morning. Nonetheless her wound has been saying about the same we have continued to use the silver collagen underneath the wound VAC. She has no discomfort. 07/19/17; patient follows every 2 weeks for her right if she'll wound. Since I last saw this a month ago her dimensions of come down to 2.6 cm. This is slightly down from a month ago when it was 2.9 cm in 4 months ago it was 4.6 cm. I note that there were insurance company issues with regard to the wound VAC which she apparently is not having on for several weeks now.  I think it would be reasonable to change therapies here. Will use silver alginate rope 08/02/17 on evaluation today patient's wound actually appears to be doing significantly better in regard to the depth as compared to her previous evaluation. She is having no discomfort. Unfortunately she never got the Wound VAC reapproved following the insurance issues from several weeks ago. With that being said it does not appear that she needs to requires the Consalvo, Katianne L. ([id number]) Wound VAC anymore and in fact I feel that she is doing better and making greater improvements at this point without it. Fortunately she has no nausea, vomiting, diarrhea, fevers, or chills. 08/16/17; patient's wound depth down to 2.3 cm. She is using silver alginate packing rope. Note that her wound VAC was discontinued due to insurance issues. This is not particularly surprising. She has not been systemically unwell and has no other new complaints 08/30/17; no change in depth. Still at 2.3 cm. Still with the same gritty surface requiring debridement. I've been using silver alginate for quite a period of time although this came down nicely in the last month 10/04/17; the depth of this is 2 cm  however with careful inspection under high-intensity light most of the walls of this small probing sinus seemed to be normally epithelialized. I cannot exactly see the base of this however there is been no drainage. We've been using silver alginate there is no drainage on the dressing when it is removed. 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 There is no evidence of surrounding infection or pain. The patient had many review weakness in her bilateral legs. She is already been to see a neurologist who according the patient told her "you would never walk again" 11/01/17; this is a patient I last saw a month ago. At that point the linear tunnel in her right buttock was 2 cm. It was not possible to see the depth of this as skin had grown into the tunnel. In light of the fact that there was no drainage and no visible wound I recommended that we just allow her to go about her usual activity without dressing. She reports that almost immediately she noted drainage on her clothing although in spite of her instructions the contrary she did not come back to the clinic. She has not been specifically addressing this and is continued to no drainage without other symptoms either local or systemic 11/29/17; I follow this woman monthly. Last time the wound on her right buttock was 1.8 cm today measuring at 1.4 there has been gradual improvement in this. Concerning is the fact that the patient says that Dr. Sampson Goon of infectious disease changed her from doxycycline to Augmentin apparently because of the elevated inflammatory markers. She also said he did a culture of this area but she is not heard the results. I don't think I have his information available on care everywhere however I will check this. She is using Hydrofera Blue rope. The patient and her intake nurse report that she is still having identifiable drainage which certainly makes it clear that this is not  closed. 01/02/17 on evaluation today patient appears to be doing fairly well in regard to her left gluteal wound. She has been continuing with the [facility] Dressing's here in our office. We actually see her once a month and then subsequently are performing weekly dressing changes with Hydrofera Blue Dressing rope during nurse visits one time a week. Today there really was not much drainage  at this point. However this has happened previously where she had no drainage and was essentially thought to be close internally although the external had not completely pulled together. However then she reopened and began draining again. He has been mentioned the possibility of her seeing plastic surgery to try to get this finally and completely healed. 01/18/18 she is here in follow-up evaluation for right ischial pressure ulcer. She has an appointment with [facility] plastic surgery on 2/8 for evaluation. She continues to have minimal drainage at the tip of the dressing product. She continues to smoke, admits to 8-10 cigarettes per day. No change in treatment plan 02/06/18 on evaluation today patient appears to be doing about the same in regard to her right Ischial wound. She has been tolerating the dressing changes without complication. She did have her appointment at [facility] plastic surgery she was not really impressed. They stated that due to her weight they were unable to perform a flap and subsequently recommended the only thing they could attempt would be to exercise around the ulcer and then attempt to suture it together. The question is whether this would be effective or not. Patient really is not wanting to proceed down that road. Nonetheless she really have not changed much in regard to her wound measurements. 02/20/18 on evaluation today patient appears to be doing about the same in regard to her right Ischial ulcer. She has been tolerating the dressing changes without complication. Though she does not show  any signs of infection at this point she has really not made any progress as far as granulating in and the depth of the wound which is what I'm most concerned with at this point. As previously discussed when we want to try silver nitrate to see if this could be of benefit for her. She has no pain. Patient History Information obtained from Patient. Family History Cancer - Mother, Diabetes - Father, Hypertension - Mother,Father, Stroke - Mother, No family history of Heart Disease, Kidney Disease, Lung Disease, Seizures, Thyroid Problems. Social History Former smoker, Marital Status - Divorced, Alcohol Use - Never, Drug Use - Prior History, Caffeine Use - Rarely. MYRIA, STEENBERGEN ([id number]) Medical History Hospitalization/Surgery History - 04/09/2016, [facility], AMS. Review of Systems (ROS) Constitutional Symptoms (General Health) Denies complaints or symptoms of Fever, Chills. Respiratory The patient has no complaints or symptoms. Cardiovascular The patient has no complaints or symptoms. Psychiatric The patient has no complaints or symptoms. Objective Constitutional Obese and well-hydrated in no acute distress. Vitals Time Taken: 8:09 AM, Height: 63 in, Weight: 257 lbs, BMI: 45.5, Temperature: 98.8 F, Pulse: 96 bpm, Respiratory Rate: 18 breaths/min, Blood Pressure: 137/65 mmHg. Respiratory normal breathing without difficulty. Psychiatric this patient is able to make decisions and demonstrates good insight into disease process. Alert and Oriented x 3. pleasant and cooperative. General Notes: Patient's wound continues to show signs of epithelialization down the side of the wound edges which I think is definitely preventing healing appropriately. With that being said there is no evidence of infection and even aggressive debridement as I performed last week does not seem to have made a significant improvement overall in the progression of the wound. Therefore I did attempt chemical  cauterization with silver nitrate to see if this could be of benefit today. Patient tolerated this with no discomfort. Integumentary (Hair, Skin) Wound #3 status is Open. Original cause of wound was Pressure Injury. The wound is located on the Right Gluteal fold. The wound measures 0.5cm length x 0.5cm width  x 1.5cm depth; 0.196cm^2 area and 0.295cm^3 volume. There is Fat Layer (Subcutaneous Tissue) Exposed exposed. There is no tunneling or undermining noted. There is a medium amount of serous drainage noted. The wound margin is distinct with the outline attached to the wound base. There is large (67-100%) pink granulation within the wound bed. There is a small (1-33%) amount of necrotic tissue within the wound bed including Adherent Slough. The periwound skin appearance did not exhibit: Callus, Crepitus, Excoriation, Induration, Rash, Scarring, Dry/Scaly, Maceration, Atrophie Blanche, Cyanosis, Ecchymosis, Hemosiderin Staining, Mottled, Pallor, Rubor, Erythema. Periwound temperature was noted as No Abnormality. The periwound has tenderness on palpation. Prestwood, Giana L. 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 An CHEM CAUT GRANULATION TISS procedure was performed by STONE III, Kruti Horacek E., PA-C. Post procedure Diagnosis Wound #3: Same as Pre-Procedure Verbal informed consent: was obtained for chemical cauterization of hypergranulation tissue with silver nitrate. Risk and benefits were discussed with patient. Patient verbalized understanding that chemical cauterization is beneficial for managing hypergranulation tissue, and understands how hypergranulation tissue can prevent proper wound healing. The risk of the procedure includes but is not limited to damage of  surrounding normally healing tissue. Procedure: Procedure time: 8:30 AM Topical anesthetic was not necessary prior to the start of the procedure Utilizing to silver nitrate sticks, the area of epithelialization down the sides of the wound bed of the right ischial location was chemically cauterized. I informed the patient that the dark discoloration is a normal result of the silver nitrate product. Patient tolerated the procedure well with no discomfort. Plan Wound Cleansing: Wound #3 Right Gluteal fold: Clean wound with Normal Saline. Anesthetic (add to Medication List): Wound #3 Right Gluteal fold: Topical Lidocaine 4% cream applied to wound bed prior to debridement (In Clinic Only). Skin Barriers/Peri-Wound Care: HEND, MCCARRELL ([id number]) Wound #3 Right Gluteal fold: Skin Prep Primary Wound Dressing: Wound #3 Right Gluteal fold: Hydrogel Pack wound with: - cuitmed sorbact heavily coat with hydrogel Secondary Dressing: Wound #3 Right Gluteal fold: [place] Dressing Change Frequency: Wound #3 Right Gluteal fold: Change dressing every week Follow-up Appointments: Return Appointment in 2 weeks. - MD appts opposites weeks Nurse Visit as needed - opposite weeks Off-Loading: Wound #3 Right Gluteal fold: Turn and reposition every 2 hours Additional Orders / Instructions: Wound #3 Right Gluteal fold: Increase protein intake. The following medication(s) was prescribed: lidocaine topical 4 % cream 1 1 cream topical was prescribed at facility At this point I'm gonna suggest that we see how the silver nitrate does over the next two weeks we will otherwise continue with the current wound care orders. Patient is in agreement with the plan. Hopefully we will be able to take care of some of the epithelialization occurring down the side walls of the wound in order to help allow this to granulate in we will see if this is successful or not in the next two weeks. This may need to be  repeated. Please see above for specific wound care orders. We will see patient for re-evaluation in 1 week(s) here in the clinic. If anything worsens or changes patient will contact our office for additional recommendations. Electronic Signature(s) Signed: 03/14/2018 3:36:20 AM By: Lenda Kelp PA-C Previous Signature: 02/20/2018 5:35:20 PM Version By: Lenda Kelp PA-C Entered By:  Lenda Kelp on 02/26/2018 15:58:54 Mcevoy, [name] ([id number]) -------------------------------------------------------------------------------- ROS/PFSH Details Patient Name: SUSANNA, BENGE Date of Service: 02/20/2018 8:00 AM Medical Record Number: [id number] Patient Account Number: [id number] Date of Birth/Sex: Sep 12, 1961 (57 y.o. Female) Treating RN: Ashok Cordia, Debi Primary Care Provider: Joen [name] Other Clinician: Referring Provider: Joen [name] Treating Provider/Extender: Linwood Dibbles, [place] Stegemann Weeks in Treatment: 12 Information Obtained From Patient Wound History Do you currently have one or more open woundso Yes How many open wounds do you currently haveo 3 Approximately how long have you had your woundso since April 2017 How have you been treating your wound(s) until nowo silver Has your wound(s) ever healed and then re-openedo No Have you had any lab work done in the past montho No Have you tested positive for an antibiotic resistant organism (MRSA, VRE)o No Have you tested positive for osteomyelitis (bone infection)o No Have you had any tests for circulation on your legso No Have you had other problems associated with your woundso Swelling Constitutional Symptoms (General Health) Complaints and Symptoms: Negative for: Fever; Chills Eyes Medical History: Negative for: Cataracts; Glaucoma; Optic Neuritis Ear/Nose/Mouth/Throat Medical History: Negative for: Chronic sinus problems/congestion; Middle ear problems Hematologic/Lymphatic Medical History: Negative for: Anemia; Hemophilia; Human  Immunodeficiency Virus; Lymphedema; Sickle Cell Disease Respiratory Complaints and Symptoms: No Complaints or Symptoms Medical History: Positive for: Asthma Negative for: Aspiration; Chronic Obstructive Pulmonary Disease (COPD); Pneumothorax; Sleep Apnea; Tuberculosis Cardiovascular Complaints and Symptoms: No Complaints or Symptoms Medical History: Positive for: Hypertension Singleterry, Cindee L. ([id number]) Negative for: Angina; Arrhythmia; Congestive Heart Failure; Coronary Artery Disease; Deep Vein Thrombosis; Hypotension; Myocardial Infarction; Peripheral Arterial Disease; Peripheral Venous Disease; Phlebitis; Vasculitis Gastrointestinal Medical History: Negative for: Cirrhosis ; Colitis; Crohnos; Hepatitis A; Hepatitis B; Hepatitis C Endocrine Medical History: Positive for: Type II Diabetes Negative for: Type I Diabetes Time with diabetes: 30 yrs Treated with: Insulin Blood sugar tested every day: Yes Tested : Blood sugar testing results: Breakfast: 121 Genitourinary Medical History: Negative for: End Stage Renal Disease Immunological Medical History: Negative for: Lupus Erythematosus; Raynaudos; Scleroderma Integumentary (Skin) Medical History: Negative for: History of Burn; History of pressure wounds Musculoskeletal Medical History: Negative for: Gout; Rheumatoid Arthritis; Osteoarthritis; Osteomyelitis Neurologic Medical History: Positive for: Neuropathy Negative for: Dementia; Quadriplegia; Paraplegia; Seizure Disorder Oncologic Medical History: Negative for: Received Chemotherapy; Received Radiation Psychiatric Complaints and Symptoms: No Complaints or Symptoms Medical History: Negative for: Anorexia/bulimia; Confinement Anxiety Leffel, Tyishia L. ([id number]) Immunizations Pneumococcal Vaccine: Received Pneumococcal Vaccination: No Implantable Devices Hospitalization / Surgery History Name of Hospital Purpose of Hospitalization/Surgery Date [facility] AMS  04/09/2016 Family and Social History Cancer: Yes - Mother; Diabetes: Yes - Father; Heart Disease: No; Hypertension: Yes - Mother,Father; Kidney Disease: No; Lung Disease: No; Seizures: No; Stroke: Yes - Mother; Thyroid Problems: No; Former smoker; Marital Status - Divorced; Alcohol Use: Never; Drug Use: Prior History; Caffeine Use: Rarely; Advanced Directives: No; Patient does not want information on Advanced Directives; Living Will: No; Medical Power of Attorney: No Physician Affirmation I have reviewed and agree with the above information. Electronic Signature(s) Signed: 02/20/2018 5:35:20 PM By: Lenda Kelp PA-C Signed: 02/21/2018 4:30:35 PM By: Alejandro Mulling Entered By: Lenda Kelp on 02/20/2018 08:58:57 Gargis, [name] ([id number]) -------------------------------------------------------------------------------- SuperBill Details Patient Name: [name] Date of Service: 02/20/2018 Medical Record Number: [id number] Patient Account Number: [id number] Date of Birth/Sex: December 22, 1961 (57 y.o. Female) Treating RN: Ashok Cordia, Debi Primary Care Provider: Joen [name] Other Clinician: Referring Provider: Joen [name] Treating Provider/Extender: Linwood Dibbles, Aminah Zabawa Weeks in Treatment:  73 Diagnosis Coding ICD-10 Codes Code Description L89.314 Pressure ulcer of right buttock, stage 4 E11.42 Type 2 diabetes mellitus with diabetic polyneuropathy Facility Procedures CPT4 Code: [id number] Description: 17250 - CHEM CAUT GRANULATION TISS ICD-10 Diagnosis Description L89.314 Pressure ulcer of right buttock, stage 4 Modifier: Quantity: 1 Physician Procedures CPT4 Code: [id number] Description: 17250 - WC PHYS CHEM CAUT GRAN TISSUE ICD-10 Diagnosis Description L89.314 Pressure ulcer of right buttock, stage 4 Modifier: Quantity: 1 Electronic Signature(s) Signed: 02/20/2018 5:35:20 PM By: Lenda Kelp PA-C Entered By: Lenda Kelp on 02/20/2018 09:02:54

## 2018-02-25 NOTE — Progress Notes (Signed)
DORIAN, RENFRO ([id number]) Visit Report for 02/20/2018 Arrival Information Details Patient Name: [name], [name] Date of Service: 02/20/2018 8:00 AM Medical Record Number: [id number] Patient Account Number: [id number] Date of Birth/Sex: Jul 13, 1961 (57 y.o. Female) Treating RN: Roger Shelter Primary Care Gamal Todisco: Lavera Guise Other Clinician: Referring Bazil Dhanani: Lavera Guise Treating Myrlene Riera/Extender: Melburn Hake, HOYT Weeks in Treatment: 11 Visit Information History Since Last Visit All ordered tests and consults were completed: No Patient Arrived: Wheel Chair Added or deleted any medications: No Arrival Time: 08:09 Any new allergies or adverse reactions: No Accompanied By: self Had a fall or experienced change in No Transfer Assistance: EasyPivot Patient activities of daily living that may affect Lift risk of falls: Patient Identification Verified: Yes Signs or symptoms of abuse/neglect since last visito No Secondary Verification Process Yes Hospitalized since last visit: No Completed: Pain Present Now: No Patient Requires Transmission-Based No Precautions: Patient Has Alerts: Yes Patient Alerts: DM II Electronic Signature(s) Signed: 02/23/2018 5:56:09 PM By: Roger Shelter Entered By: Roger Shelter on 02/20/2018 08:09:37 [place]ambro, [name] ([id number]) -------------------------------------------------------------------------------- Encounter Discharge Information Details Patient Name: [name] Date of Service: 02/20/2018 8:00 AM Medical Record Number: [id number] Patient Account Number: [id number] Date of Birth/Sex: 11/23/1961 (57 y.o. Female) Treating RN: Carolyne Fiscal, Debi Primary Care Harla Mensch: Lavera Guise Other Clinician: Referring Holt Woolbright: Lavera Guise Treating Rourke Mcquitty/Extender: Melburn Hake, HOYT Weeks in Treatment: 49 Encounter Discharge Information Items Discharge Pain Level: 0 Discharge Condition: Stable Ambulatory Status: Wheelchair Discharge Destination:  Home Transportation: Private Auto Accompanied By: self Schedule Follow-up Appointment: Yes Medication Reconciliation completed and No provided to Patient/Care Rena Sweeden: Provided on Clinical Summary of Care: 02/20/2018 Form Type Recipient Paper Patient TW Electronic Signature(s) Signed: 02/23/2018 5:56:09 PM By: Roger Shelter Entered By: Roger Shelter on 02/20/2018 08:35:41 Hendrix, [name] ([id number]) -------------------------------------------------------------------------------- Lower Extremity Assessment Details Patient Name: [name] Date of Service: 02/20/2018 8:00 AM Medical Record Number: [id number] Patient Account Number: [id number] Date of Birth/Sex: 21-Nov-1961 (56 y.o. Female) Treating RN: Roger Shelter Primary Care Johnathan Tortorelli: Lavera Guise Other Clinician: Referring Zayden Maffei: Lavera Guise Treating Dorene Bruni/Extender: Melburn Hake, HOYT Weeks in Treatment: 73 Electronic Signature(s) Signed: 02/23/2018 5:56:09 PM By: Roger Shelter Entered By: Roger Shelter on 02/20/2018 08:17:12 Mengel, [name] ([id number]) -------------------------------------------------------------------------------- Multi Wound Chart Details Patient Name: [name] Date of Service: 02/20/2018 8:00 AM Medical Record Number: [id number] Patient Account Number: [id number] Date of Birth/Sex: 1961-12-24 (57 y.o. Female) Treating RN: Carolyne Fiscal, Debi Primary Care Darious Rehman: Lavera Guise Other Clinician: Referring Lilas Diefendorf: Lavera Guise Treating Maize Brittingham/Extender: Melburn Hake, HOYT Weeks in Treatment: 70 Vital Signs Height(in): 63 Pulse(bpm): 96 Weight(lbs): 257 Blood Pressure(mmHg): 137/65 Body Mass Index(BMI): 46 Temperature(F): 98.8 Respiratory Rate 18 (breaths/min): Photos: [3:No Photos] [N/A:N/A] Wound Location: [3:Right Gluteal fold] [N/A:N/A] Wounding Event: [3:Pressure Injury] [N/A:N/A] Primary Etiology: [3:Pressure Ulcer] [N/A:N/A] Comorbid History: [3:Asthma, Hypertension,  Type II Diabetes, Neuropathy] [N/A:N/A] Date Acquired: [3:04/21/2016] [N/A:N/A] Weeks of Treatment: [3:73] [N/A:N/A] Wound Status: [3:Open] [N/A:N/A] Measurements L x W x D [3:0.5x0.5x1.5] [N/A:N/A] (cm) Area (cm) : [3:0.196] [N/A:N/A] Volume (cm) : [3:0.295] [N/A:N/A] % Reduction in Area: [3:93.60%] [N/A:N/A] % Reduction in Volume: [3:98.30%] [N/A:N/A] Classification: [3:Category/Stage IV] [N/A:N/A] Exudate Amount: [3:Medium] [N/A:N/A] Exudate Type: [3:Serous] [N/A:N/A] Exudate Color: [3:amber] [N/A:N/A] Wound Margin: [3:Distinct, outline attached] [N/A:N/A] Granulation Amount: [3:Large (67-100%)] [N/A:N/A] Granulation Quality: [3:Pink] [N/A:N/A] Necrotic Amount: [3:Small (1-33%)] [N/A:N/A] Exposed Structures: [3:Fat Layer (Subcutaneous Tissue) Exposed: Yes Fascia: No Tendon: No Muscle: No Joint: No Bone: No] [N/A:N/A] Epithelialization: [3:Small (1-33%)] [N/A:N/A] Periwound Skin Texture: [3:Excoriation: No Induration: No Callus: No Crepitus:  No Rash: No Scarring: No] [N/A:N/A] Periwound Skin Moisture: Maceration: No N/A N/A Dry/Scaly: No Periwound Skin Color: Atrophie Blanche: No N/A N/A Cyanosis: No Ecchymosis: No Erythema: No Hemosiderin Staining: No Mottled: No Pallor: No Rubor: No Temperature: No Abnormality N/A N/A Tenderness on Palpation: Yes N/A N/A Wound Preparation: Ulcer Cleansing: N/A N/A Rinsed/Irrigated with Saline Topical Anesthetic Applied: Other: lidocaine 4% Treatment Notes Electronic Signature(s) Signed: 02/21/2018 4:30:35 PM By: Alric Quan Entered By: Alric Quan on 02/20/2018 08:21:52 [place]eyer, [name] ([id number]) -------------------------------------------------------------------------------- [place] Details Patient Name: [name] Date of Service: 02/20/2018 8:00 AM Medical Record Number: [id number] Patient Account Number: [id number] Date of Birth/Sex: 08-Oct-1961 (56 y.o. Female) Treating RN: Carolyne Fiscal,  Debi Primary Care Rydge Texidor: Lavera Guise Other Clinician: Referring Nelli Swalley: Lavera Guise Treating Teonna Coonan/Extender: Melburn Hake, HOYT Weeks in Treatment: 54 Active Inactive ` Abuse / Safety / Falls / Self Care Management Nursing Diagnoses: Potential for falls Goals: Patient will remain injury free Date Initiated: 09/21/2016 Target Resolution Date: 03/25/2017 Goal Status: Active Interventions: Assess fall risk on admission and as needed Notes: ` Nutrition Nursing Diagnoses: Imbalanced nutrition Potential for alteratiion in Nutrition/Potential for imbalanced nutrition Goals: Patient/caregiver agrees to and verbalizes understanding of need to use nutritional supplements and/or vitamins as prescribed Date Initiated: 09/21/2016 Target Resolution Date: 03/25/2017 Goal Status: Active Patient/caregiver verbalizes understanding of need to maintain therapeutic glucose control per primary care physician Date Initiated: 09/21/2016 Target Resolution Date: 03/25/2017 Goal Status: Active Interventions: Assess patient nutrition upon admission and as needed per policy Notes: ` Orientation to the Wound Care Program Nursing Diagnoses: Knowledge deficit related to the wound healing center program Goals: Patient/caregiver will verbalize understanding of the [place], Dix ([id number]) Date Initiated: 09/21/2016 Target Resolution Date: 03/25/2017 Goal Status: Active Interventions: Provide education on orientation to the wound center Notes: ` Pain, Acute or Chronic Nursing Diagnoses: Pain, acute or chronic: actual or potential Potential alteration in comfort, pain Goals: Patient will verbalize adequate pain control and receive pain control interventions during procedures as needed Date Initiated: 09/21/2016 Target Resolution Date: 03/25/2017 Goal Status: Active Patient/caregiver will verbalize adequate pain control between visits Date Initiated:  09/21/2016 Target Resolution Date: 03/25/2017 Goal Status: Active Patient/caregiver will verbalize comfort level met Date Initiated: 09/21/2016 Target Resolution Date: 03/25/2017 Goal Status: Active Interventions: Assess comfort goal upon admission Complete pain assessment as per visit requirements Notes: ` Wound/Skin Impairment Nursing Diagnoses: Impaired tissue integrity Goals: Ulcer/skin breakdown will have a volume reduction of 30% by week 4 Date Initiated: 09/21/2016 Target Resolution Date: 03/25/2017 Goal Status: Active Ulcer/skin breakdown will have a volume reduction of 50% by week 8 Date Initiated: 09/21/2016 Target Resolution Date: 03/25/2017 Goal Status: Active Ulcer/skin breakdown will have a volume reduction of 80% by week 12 Date Initiated: 09/21/2016 Target Resolution Date: 03/25/2017 Goal Status: Active Interventions: Assess ulceration(s) every visit Notes: [name], [name] ([id number]) Electronic Signature(s) Signed: 02/21/2018 4:30:35 PM By: Alric Quan Entered By: Alric Quan on 02/20/2018 08:21:46 [place], Kalaya Carlean Jews ([id number]) -------------------------------------------------------------------------------- Pain Assessment Details Patient Name: [name] Date of Service: 02/20/2018 8:00 AM Medical Record Number: [id number] Patient Account Number: [id number] Date of Birth/Sex: 11-Nov-1961 (57 y.o. Female) Treating RN: Roger Shelter Primary Care Kaylia Winborne: Lavera Guise Other Clinician: Referring Jemimah Cressy: Lavera Guise Treating Emilo Gras/Extender: Melburn Hake, HOYT Weeks in Treatment: 62 Active Problems Location of Pain Severity and Description of Pain Patient Has Paino No Site Locations Pain Management and Medication Current Pain Management: Electronic Signature(s) Signed: 02/23/2018 5:56:09 PM By: Roger Shelter  Entered By: Roger Shelter on 02/20/2018 08:09:44 Claus, [name]  ([id number]) -------------------------------------------------------------------------------- Patient/Caregiver Education Details Patient Name: [name] Date of Service: 02/20/2018 8:00 AM Medical Record Number: [id number] Patient Account Number: [id number] Date of Birth/Gender: April 21, 1961 (57 y.o. Female) Treating RN: Roger Shelter Primary Care Physician: Lavera Guise Other Clinician: Referring Physician: Lavera Guise Treating Physician/Extender: Sharalyn Ink in Treatment: 69 Education Assessment Education Provided To: Patient Education Topics Provided Wound/Skin Impairment: Handouts: Caring for Your Ulcer Methods: Explain/Verbal Responses: State content correctly Electronic Signature(s) Signed: 02/23/2018 5:56:09 PM By: Roger Shelter Entered By: Roger Shelter on 02/20/2018 08:36:17 Yapp, [name] ([id number]) -------------------------------------------------------------------------------- Wound Assessment Details Patient Name: [name] Date of Service: 02/20/2018 8:00 AM Medical Record Number: [id number] Patient Account Number: [id number] Date of Birth/Sex: 12/23/1961 (57 y.o. Female) Treating RN: Roger Shelter Primary Care Tine Mabee: Lavera Guise Other Clinician: Referring Iyana Topor: Lavera Guise Treating Korianna Washer/Extender: Melburn Hake, HOYT Weeks in Treatment: 73 Wound Status Wound Number: 3 Primary Pressure Ulcer Etiology: Wound Location: Right Gluteal fold Wound Status: Open Wounding Event: Pressure Injury Comorbid Asthma, Hypertension, Type II Diabetes, Date Acquired: 04/21/2016 History: Neuropathy Weeks Of Treatment: 73 Clustered Wound: No Photos Wound Measurements Length: (cm) 0.5 % Reductio Width: (cm) 0.5 % Reductio Depth: (cm) 1.5 Epithelial Area: (cm) 0.196 Tunneling Volume: (cm) 0.295 Undermini n in Area: 93.6% n in Volume: 98.3% ization: Small (1-33%) : No ng: No Wound Description Classification: Category/Stage IV Foul  Odor Wound Margin: Distinct, outline attached Slough/Fib Exudate Amount: Medium Exudate Type: Serous Exudate Color: amber After Cleansing: No rino Yes Wound Bed Granulation Amount: Large (67-100%) Exposed Structure Granulation Quality: Pink Fascia Exposed: No Necrotic Amount: Small (1-33%) Fat Layer (Subcutaneous Tissue) Exposed: Yes Necrotic Quality: Adherent Slough Tendon Exposed: No Muscle Exposed: No Joint Exposed: No Bone Exposed: No Periwound Skin Texture Texture Color Mckinnie, Rona L. ([id number]) No Abnormalities Noted: No No Abnormalities Noted: No Callus: No Atrophie Blanche: No Crepitus: No Cyanosis: No Excoriation: No Ecchymosis: No Induration: No Erythema: No Rash: No Hemosiderin Staining: No Scarring: No Mottled: No Pallor: No Moisture Rubor: No No Abnormalities Noted: No Dry / Scaly: No Temperature / Pain Maceration: No Temperature: No Abnormality Tenderness on Palpation: Yes Wound Preparation Ulcer Cleansing: Rinsed/Irrigated with Saline Topical Anesthetic Applied: Other: lidocaine 4%, Treatment Notes Wound #3 (Right Gluteal fold) 1. Cleansed with: Clean wound with Normal Saline 2. Anesthetic Topical Lidocaine 4% cream to wound bed prior to debridement Other (specify in notes) 3. Peri-wound Care: Skin Prep 4. Dressing Applied: Hydrogel 5. Secondary Dressing Applied Bordered Foam Dressing Notes silver nitrate in clinic, cutimed sorbact Electronic Signature(s) Signed: 02/23/2018 5:56:09 PM By: Roger Shelter Entered By: Roger Shelter on 02/20/2018 10:10:01 [place], [name] ([id number]) -------------------------------------------------------------------------------- [place] Details Patient Name: [name] Date of Service: 02/20/2018 8:00 AM Medical Record Number: [id number] Patient Account Number: [id number] Date of Birth/Sex: 1961-09-24 (57 y.o. Female) Treating RN: Roger Shelter Primary Care Aidric Endicott: Lavera Guise Other  Clinician: Referring Doloris Servantes: Lavera Guise Treating Liberty Seto/Extender: Melburn Hake, HOYT Weeks in Treatment: 73 Vital Signs Time Taken: 08:09 Temperature (F): 98.8 Height (in): 63 Pulse (bpm): 96 Weight (lbs): 257 Respiratory Rate (breaths/min): 18 Body Mass Index (BMI): 45.5 Blood Pressure (mmHg): 137/65 Reference Range: 80 - 120 mg / dl Electronic Signature(s) Signed: 02/23/2018 5:56:09 PM By: Roger Shelter Entered By: Roger Shelter on 02/20/2018 08:14:17

## 2018-02-27 ENCOUNTER — Ambulatory Visit: Payer: BLUE CROSS/BLUE SHIELD

## 2018-02-28 ENCOUNTER — Ambulatory Visit: Payer: BLUE CROSS/BLUE SHIELD | Attending: Family Medicine

## 2018-02-28 ENCOUNTER — Ambulatory Visit: Payer: BLUE CROSS/BLUE SHIELD

## 2018-02-28 ENCOUNTER — Other Ambulatory Visit: Payer: Self-pay

## 2018-02-28 DIAGNOSIS — R262 Difficulty in walking, not elsewhere classified: Secondary | ICD-10-CM | POA: Insufficient documentation

## 2018-02-28 DIAGNOSIS — M6281 Muscle weakness (generalized): Secondary | ICD-10-CM | POA: Diagnosis not present

## 2018-02-28 DIAGNOSIS — R2689 Other abnormalities of gait and mobility: Secondary | ICD-10-CM | POA: Diagnosis present

## 2018-02-28 NOTE — Therapy (Signed)
[facility] MAIN [facility] SERVICES [address] [place], [place], [address] Phone: [phone]   Fax:  [phone]  Physical Therapy Evaluation  Patient Details  Name: [name] MRN: [id number] Date of Birth: 06-Oct-1961 Referring Provider: Dr. Karenann Cai   Encounter Date: 02/28/2018  PT End of Session - 02/28/18 1054    Visit Number  1    Number of Visits  8    Date for PT Re-Evaluation  04/25/18    PT Start Time  0930    PT Stop Time  1023    PT Time Calculation (min)  53 min    Equipment Utilized During Treatment  Gait belt bilateral AFO's    Activity Tolerance  Patient tolerated treatment well;Patient limited by fatigue    Behavior During Therapy  [facility] for tasks assessed/performed       Past Medical History:  Diagnosis Date  . Acute pain of right shoulder 05/19/2016  . Acute PN (pyelonephritis) 05/18/2016  . Acute pyelonephritis 05/18/2016  . Anxiety   . Arthritis   . Asthma   . Diabetes mellitus without complication (HCC)   . GERD (gastroesophageal reflux disease)   . Glaucoma   . Hyperlipemia   . Hyperlipemia   . Hypertension     Past Surgical History:  Procedure Laterality Date  . ANTERIOR CRUCIATE LIGAMENT REPAIR      There were no vitals filed for this visit.   Subjective Assessment - 02/28/18 0940    Subjective  Patient is a pleasant 57 year old female who presents with generalized weakness.     Pertinent History  Patient presented in manual wheelchair with bilateral AFOs. April of 2017 patient came home from work reported not feeling good, The next couple days was not feeling good. Went to doctor who called EMS to take her to the hospital. Was unable to get a number on her sugars initially due to patient not taking her medicine due to not feeling well and not eating. Sugar was read to be in the 700's, also found multiple strains of a UTI/sepsis while there. Was in ICU for 12 days at [facility] then went to [facility]. While at [facility] was not walked or gotten up so after the fourth months there she returned home with in home therapy. Has not had any physical therapy since October. Gets around house with walker ok or in her wheelchair. Has pressure ulcer on Right buttocks with pmh including GERD, DM type II, HTN, and polyneuropathy. Patient wants to get stronger to be able to walk and stand again for more independence and to help her mother.     Limitations  Lifting;Standing;Walking;House hold activities;Other (comment)    How long can you sit comfortably?  hour    How long can you stand comfortably?  stand 5 minutes with walker     How long can you walk comfortably?  10 ft    Patient Stated Goals  walk better, want to drive, stand for longer, help her mom    Currently in Pain?  No/denies         [facility] PT Assessment - 02/28/18 0001      Assessment   Medical Diagnosis  muscle weakness    Referring Provider  Dr. Karenann Cai    Onset Date/Surgical Date  03/31/16    Hand Dominance  Right    Next MD Visit  next month     Prior Therapy  yes home health,  inpatient       Precautions   Precautions  Other (comment) pressure ulcer on right buttock    Required Braces or Orthoses  -- bilateral AFOs      Restrictions   Weight Bearing Restrictions  No      Balance Screen   Has the patient fallen in the past 6 months  No    Has the patient had a decrease in activity level because of a fear of falling?   Yes    Is the patient reluctant to leave their home because of a fear of falling?   Yes      Home Environment   Living Environment  Private residence    Living Arrangements  Alone    Available Help at Discharge  Family;Friend(s)    Type of Home  House    Home Access  Ramped entrance    Home Layout  One level    Home Equipment  Walker - standard;Hospital bed;Grab bars - toilet;Wheelchair - manual      Prior Function   Level of Independence  Independent with household mobility with device     Vocation  On disability    Leisure  wants to adopt a dog, take care of mother       Cognition   Overall Cognitive Status  Within Functional Limits for tasks assessed      Observation/Other Assessments   Skin Integrity  pressure ulcer R buttocks      Sensation   Light Touch  Appears Intact      Coordination   Gross Motor Movements are Fluid and Coordinated  No    Coordination and Movement Description  limited by strength and body habitus       Posture/Postural Control   Posture/Postural Control  Postural limitations    Postural Limitations  Increased lumbar lordosis;Weight shift left;Flexed trunk      ROM / Strength   AROM / PROM / Strength  Strength      Strength   Overall Strength  Deficits    Strength Assessment Site  Hip;Knee    Right/Left Hip  Right;Left    Right Hip Flexion  4/5    Right Hip Extension  3/5    Right Hip ABduction  4-/5    Right Hip ADduction  4-/5    Left Hip Flexion  4-/5    Left Hip Extension  2+/5    Left Hip ABduction  3+/5    Left Hip ADduction  3+/5    Right/Left Knee  Right;Left    Right Knee Flexion  4-/5    Right Knee Extension  4/5    Left Knee Flexion  3+/5    Left Knee Extension  4-/5      Flexibility   Soft Tissue Assessment /Muscle Length  yes    Quadriceps  Tight illipsoas noted in standing bilaterally       Transfers   Transfers  Sit to Stand;Stand to Sit    Sit to Stand  4: Min guard;With upper extremity assist    Five time sit to stand comments   46 seconds with RUE pushing off chair and LUE on walker     Stand to Sit  4: Min guard;With upper extremity assist      Ambulation/Gait   Ambulation/Gait  Yes    Ambulation/Gait Assistance  4: Min guard    Ambulation Distance (Feet)  30 Feet    Assistive device  Standard walker    Gait Pattern  Step-through pattern;Decreased stride length;Decreased dorsiflexion - right;Decreased dorsiflexion - left;Shuffle;Narrow base of support;Trunk flexed    Ambulation Surface  Level;Indoor     Gait velocity  .11 m/s      Balance   Balance Assessed  Yes      Static Sitting Balance   Static Sitting - Balance Support  Feet supported    Static Sitting - Level of Assistance  5: Stand by assistance      Dynamic Sitting Balance   Dynamic Sitting - Balance Support  Feet supported    Dynamic Sitting - Level of Assistance  5: Stand by assistance    Dynamic Sitting Balance - Compensations  trunk flexion    Dynamic Sitting - Balance Activities  Lateral lean/weight shifting;Forward lean/weight shifting      Static Standing Balance   Static Standing - Balance Support  Bilateral upper extremity supported    Static Standing - Level of Assistance  5: Stand by assistance      Dynamic Standing Balance   Dynamic Standing - Balance Support  Bilateral upper extremity supported    Dynamic Standing - Level of Assistance  5: Stand by assistance    Dynamic Standing - Balance Activities  -- walking        OUTCOME MEASURES: TEST Outcome Interpretation  5 times sit<>stand 46 sec with Rue pushing off chair and LUE on walker  >60 yo, >15 sec indicates increased risk for falls  10 meter walk test          1 minute 29 seconds  =.11   m/s <1.0 m/s indicates increased risk for falls; limited community ambulator  LEFS 24/80 High perceived disability   ABC 28.7% Low level of physical functioning             Treat: Gluteal squeezes 10x 5 second holds Green theraband hamstring curls 10x           Objective measurements completed on examination: See above findings.              PT Education - 02/28/18 1052    Education provided  Yes    Education Details  POC, HEP, strengthening and transfer biomechanics    Person(s) Educated  Patient    Methods  Explanation;Demonstration;Verbal cues    Comprehension  Verbalized understanding;Returned demonstration       PT Short Term Goals - 02/28/18 1058      PT SHORT TERM GOAL #1   Title  Patient will be independent in home exercise  program to improve strength/mobility for better functional independence with ADLs.    Baseline  HEP added    Time  2    Period  Weeks    Status  New    Target Date  03/14/18      PT SHORT TERM GOAL #2   Title   Patient (< 70 years old) will complete five times sit to stand test in < 20 seconds indicating an increased LE strength and improved balance    Baseline  3/6: 46 seconds with UE support     Time  2    Period  Weeks    Status  New    Target Date  03/14/18      PT SHORT TERM GOAL #3   Title  Patient will ambulate 50 ft without rest breaks to increase functional independence.     Baseline  ambulates 30 ft     Time  2    Period  Weeks  Status  New    Target Date  03/14/18      PT SHORT TERM GOAL #4   Title  Patient will increase BLE gross strength to 4-/5 as to improve functional strength for independent gait, increased standing tolerance and increased ADL ability.    Baseline  LLE 3/5 gross     Time  2    Period  Weeks    Status  New    Target Date  03/14/18        PT Long Term Goals - 02/28/18 1105      PT LONG TERM GOAL #1   Title  Patient will increase BLE gross strength to 4+/5 as to improve functional strength for independent gait, increased standing tolerance and increased ADL ability.    Baseline  3/6: LLE gross 3/5    Time  8    Period  Weeks    Status  New    Target Date  04/25/18      PT LONG TERM GOAL #2   Title  Patient will increase lower extremity functional scale to >60/80 to demonstrate improved functional mobility and increased tolerance with ADLs.     Baseline  3/6: 24/80    Time  8    Period  Weeks    Status  New    Target Date  04/25/18      PT LONG TERM GOAL #3   Title  Patient (< 51 years old) will complete five times sit to stand test in < 10 seconds indicating an increased LE strength and improved balance    Baseline  3/6: 46 seconds with UE support     Time  8    Period  Weeks    Status  New    Target Date  04/25/18      PT  LONG TERM GOAL #4   Title  Patient will increase 10 meter walk test to >1.68m/s as to improve gait speed for better community ambulation and to reduce fall risk    Baseline  .11 m/s     Time  8    Period  Weeks    Status  New    Target Date  04/25/18      PT LONG TERM GOAL #5   Title  Patient will increase ABC scale score >80% to demonstrate better functional mobility and better confidence with ADLs.     Baseline  3/6: 28.7%    Time  8    Period  Weeks    Status  New    Target Date  04/25/18             Plan - 02/28/18 1055    Clinical Impression Statement  Patient is a pleasant 57 year old female who presents to physical therapy for generalized weakness. Patient's mobility is limited to short distances with walker in home and primarily uses manual wheelchair. Patient presents with generalized weakness of bilateral lower extremities with left lower extremity weaker than right. Posterior leg musculature weaker than anterior due to patient's preference for sitting or laying down positioning. Bilateral AFO's are utilized due to patient' inability to "feel her feet". 10 MWT- .11 m/s with walker and 5x STS= 46 seconds with UE assistance. LEFS 24/80 and ABC 28.7% indicate low level of physical functioning. Patient would benefit from skilled physical therapy to increase strength, mobility, and gait mechanics for increased quality of life.     History and Personal Factors relevant to plan of care:  This patient presents with  3, personal factors/ comorbidities, and, 4  body elements including body structures and functions, activity limitations and or participation restrictions. Patient's condition is  Evolving.     Clinical Presentation  Evolving    Clinical Presentation due to:  progressively weakening with decreased mobility     Clinical Decision Making  Moderate    Rehab Potential  Fair    Clinical Impairments Affecting Rehab Potential  + good motivation, +performing HEP from previous home  health -chronicity, body habitus     PT Frequency  1x / week    PT Duration  8 weeks    PT Treatment/Interventions  ADLs/Self Care Home Management;Cryotherapy;Ultrasound;Traction;Moist Heat;Electrical Stimulation;DME Instruction;Gait training;Stair training;Balance training;Therapeutic exercise;Therapeutic activities;Functional mobility training;Neuromuscular re-education;Patient/family education;Orthotic Fit/Training;Manual techniques;Wheelchair mobility training;Passive range of motion;Energy conservation;Taping    PT Next Visit Plan  ambulatory, LE strengthening.     PT Home Exercise Plan  added gluteal strenght and hamstring curls to HEP    Consulted and Agree with Plan of Care  Patient       Patient will benefit from skilled therapeutic intervention in order to improve the following deficits and impairments:  Abnormal gait, Decreased activity tolerance, Decreased balance, Decreased endurance, Decreased knowledge of precautions, Decreased mobility, Decreased strength, Difficulty walking, Impaired perceived functional ability, Impaired sensation, Postural dysfunction, Improper body mechanics, Pain  Visit Diagnosis: Muscle weakness (generalized)  Other abnormalities of gait and mobility     Problem List Patient Active Problem List   Diagnosis Date Noted  . 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 CKD (chronic kidney disease) stage 3, GFR 30-59 ml/min (HCC) 07/04/2017  . Class 3 drug-induced obesity with serious comorbidity and body mass index (BMI) of 45.0 to 49.9 in adult (HCC) 07/04/2017  . Chronic feet pain (Location of Primary Source of Pain) (Bilateral) (R>L) 04/25/2017  . Diabetic peripheral neuropathy (HCC) (Location of Primary Source of Pain) (Bilateral) (R>L) 04/25/2017  . Chronic lower extremity pain (Location of Secondary source of pain) (Bilateral) (R>L) 04/25/2017  . Chronic pain syndrome 04/25/2017  . Long term (current) use of opiate analgesic 04/25/2017  .  Long term prescription opiate use 04/25/2017  . Opiate use 04/25/2017  . Chronic hand pain (Location of Tertiary source of pain) (Bilateral) (L>R) 04/25/2017  . Carpal tunnel syndrome (Bilateral) (L>R) 04/25/2017  . History of stroke 04/25/2017  . Infected decubitus ulcer, unstageable (HCC) 10/26/2016  . Closed fracture of humerus, surgical neck 05/31/2016  . Closed 3-part fracture of surgical neck of right humerus with delayed healing 05/31/2016  . Pain in shoulder 05/19/2016  . Edema leg 05/18/2016  . 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 Kidney lump 05/18/2016  . Bilateral edema of lower extremity 05/18/2016  . Bilateral leg weakness 05/18/2016  . Kidney mass 05/18/2016  . Pressure ulcer 04/24/2016  . History of DKA (diabetic ketoacidosis) (HCC) 04/23/2016    Class: History of  . Diabetes mellitus (HCC) 04/23/2016  . Other specified diabetes mellitus with ketoacidosis without coma (HCC) 04/23/2016   Precious Bard, PT, DPT   Precious Bard 02/28/2018, 11:09 AM  [facility] MAIN [facility] SERVICES [address] [place], [place], [address] Phone: [phone]   Fax:  [phone]  Name: [name] MRN: [id number] Date of Birth: 07/30/1961

## 2018-02-28 NOTE — Patient Instructions (Signed)
Gluteal squeezes 10x 5 second holds Green theraband hamstring curls 10x

## 2018-03-01 ENCOUNTER — Encounter: Payer: BLUE CROSS/BLUE SHIELD | Attending: Nurse Practitioner

## 2018-03-01 DIAGNOSIS — Z8249 Family history of ischemic heart disease and other diseases of the circulatory system: Secondary | ICD-10-CM | POA: Insufficient documentation

## 2018-03-01 DIAGNOSIS — I10 Essential (primary) hypertension: Secondary | ICD-10-CM | POA: Insufficient documentation

## 2018-03-01 DIAGNOSIS — Z823 Family history of stroke: Secondary | ICD-10-CM | POA: Insufficient documentation

## 2018-03-01 DIAGNOSIS — H409 Unspecified glaucoma: Secondary | ICD-10-CM | POA: Diagnosis not present

## 2018-03-01 DIAGNOSIS — F17210 Nicotine dependence, cigarettes, uncomplicated: Secondary | ICD-10-CM | POA: Insufficient documentation

## 2018-03-01 DIAGNOSIS — E1142 Type 2 diabetes mellitus with diabetic polyneuropathy: Secondary | ICD-10-CM | POA: Diagnosis present

## 2018-03-01 DIAGNOSIS — G8929 Other chronic pain: Secondary | ICD-10-CM | POA: Insufficient documentation

## 2018-03-01 DIAGNOSIS — L89314 Pressure ulcer of right buttock, stage 4: Secondary | ICD-10-CM | POA: Diagnosis present

## 2018-03-02 NOTE — Progress Notes (Signed)
BRYSON, PALEN ([id number]) Visit Report for 03/01/2018 Arrival Information Details Patient Name: [name], [name] Date of Service: 03/01/2018 8:00 AM Medical Record Number: [id number] Patient Account Number: [id number] Date of Birth/Sex: 1961/01/30 (57 y.o. Female) Treating RN: Renne Crigler Primary Care Paula Busenbark: Joen [name] Other Clinician: Referring Madaline Lefeber: Joen [name] Treating Keneth Borg/Extender: Kathreen Cosier in Treatment: 55 Visit Information History Since Last Visit All ordered tests and consults were completed: No Patient Arrived: Wheel Chair Added or deleted any medications: No Arrival Time: 08:08 Any new allergies or adverse reactions: No Accompanied By: self Had a fall or experienced change in No Transfer Assistance: EasyPivot Patient activities of daily living that may affect Lift risk of falls: Patient Identification Verified: Yes Signs or symptoms of abuse/neglect since last visito No Secondary Verification Process Yes Hospitalized since last visit: No Completed: Pain Present Now: No Patient Requires Transmission-Based No Precautions: Patient Has Alerts: Yes Patient Alerts: DM II Electronic Signature(s) Signed: 03/01/2018 4:51:26 PM By: Renne Crigler Entered By: Renne Crigler on 03/01/2018 08:09:19 Noblet, Maja Elbert Ewings ([id number]) -------------------------------------------------------------------------------- Clinic Level of Care Assessment Details Patient Name: [name] Date of Service: 03/01/2018 8:00 AM Medical Record Number: [id number] Patient Account Number: [id number] Date of Birth/Sex: 09/13/1961 (57 y.o. Female) Treating RN: Renne Crigler Primary Care Dejean Tribby: Joen [name] Other Clinician: Referring Kambra Beachem: Joen [name] Treating Lancelot Alyea/Extender: Kathreen Cosier in Treatment: 62 Clinic Level of Care Assessment Items TOOL 4 Quantity Score X - Use when only an EandM is performed on FOLLOW-UP visit 1 0 ASSESSMENTS - Nursing  Assessment / Reassessment []  - Reassessment of Co-morbidities (includes updates in patient status) 0 X- 1 5 Reassessment of Adherence to Treatment Plan ASSESSMENTS - Wound and Skin Assessment / Reassessment X - Simple Wound Assessment / Reassessment - one wound 1 5 []  - 0 Complex Wound Assessment / Reassessment - multiple wounds []  - 0 Dermatologic / Skin Assessment (not related to wound area) ASSESSMENTS - Focused Assessment []  - Circumferential Edema Measurements - multi extremities 0 []  - 0 Nutritional Assessment / Counseling / Intervention []  - 0 Lower Extremity Assessment (monofilament, tuning fork, pulses) []  - 0 Peripheral Arterial Disease Assessment (using hand held doppler) ASSESSMENTS - Ostomy and/or Continence Assessment and Care []  - Incontinence Assessment and Management 0 []  - 0 Ostomy Care Assessment and Management (repouching, etc.) PROCESS - Coordination of Care X - Simple Patient / Family Education for ongoing care 1 15 []  - 0 Complex (extensive) Patient / Family Education for ongoing care []  - 0 Staff obtains , Records, Test Results / Process Orders []  - 0 Staff telephones HHA, Nursing Homes / Clarify orders / etc []  - 0 Routine Transfer to another Facility (non-emergent condition) []  - 0 Routine Hospital Admission (non-emergent condition) []  - 0 New Admissions / / Ordering NPWT, Apligraf, etc. []  - 0 Emergency Hospital Admission (emergent condition) X- 1 10 Simple Discharge Coordination Jutte, Kyriana L. ( ) []  - 0 Complex (extensive) Discharge Coordination PROCESS - Special Needs []  - Pediatric / Minor Patient Management 0 []  - 0 Isolation Patient Management []  - 0 Hearing / Language / Visual special needs []  - 0 Assessment of Community assistance (transportation, D/C planning, etc.) []  - 0 Additional assistance / Altered mentation []  - 0 Support Surface(s) Assessment (bed, cushion, seat,  etc.) INTERVENTIONS - Wound Cleansing / Measurement X - Simple Wound Cleansing - one wound 1 5 []  - 0 Complex Wound Cleansing - multiple wounds X- 1 5 Wound Imaging (photographs - any number  of wounds) []  - 0 Wound Tracing (instead of photographs) X- 1 5 Simple Wound Measurement - one wound []  - 0 Complex Wound Measurement - multiple wounds INTERVENTIONS - Wound Dressings []  - Small Wound Dressing one or multiple wounds 0 []  - 0 Medium Wound Dressing one or multiple wounds []  - 0 Large Wound Dressing one or multiple wounds []  - 0 Application of Medications - topical []  - 0 Application of Medications - injection INTERVENTIONS - Miscellaneous []  - External ear exam 0 []  - 0 Specimen Collection (cultures, biopsies, blood, body fluids, etc.) []  - 0 Specimen(s) / Culture(s) sent or taken to Lab for analysis []  - 0 Patient Transfer (multiple staff / Nurse, adult / Similar devices) []  - 0 Simple Staple / Suture removal (25 or less) []  - 0 Complex Staple / Suture removal (26 or more) []  - 0 Hypo / Hyperglycemic Management (close monitor of Blood Glucose) []  - 0 Ankle / Brachial Index (ABI) - do not check if billed separately X- 1 5 Vital Signs Tetzlaff, Ashunti L. ([id number]) Has the patient been seen at the hospital within the last three years: Yes Total Score: 55 Level Of Care: New/Established - Level 2 Electronic Signature(s) Signed: 03/01/2018 4:51:26 PM By: Renne Crigler Entered By: Renne Crigler on 03/01/2018 08:23:35 Flenniken, [name] ([id number]) -------------------------------------------------------------------------------- Encounter Discharge Information Details Patient Name: [name] Date of Service: 03/01/2018 8:00 AM Medical Record Number: [id number] Patient Account Number: [id number] Date of Birth/Sex: 04-Oct-1961 (57 y.o. Female) Treating RN: Renne Crigler Primary Care Bostyn Kunkler: Joen [name] Other Clinician: Referring Kennice Finnie: Joen [name] Treating  Va Broadwell/Extender: Kathreen Cosier in Treatment: 25 Encounter Discharge Information Items Discharge Pain Level: 0 Discharge Condition: Stable Ambulatory Status: Wheelchair Discharge Destination: Home Transportation: Other Schedule Follow-up Appointment: Yes Medication Reconciliation completed and provided No to Patient/Care Diany Formosa: Clinical Summary of Care: Electronic Signature(s) Signed: 03/01/2018 4:51:26 PM By: Renne Crigler Entered By: Renne Crigler on 03/01/2018 08:22:55 Dapper, [name] ([id number]) -------------------------------------------------------------------------------- Patient/Caregiver Education Details Patient Name: [name] Date of Service: 03/01/2018 8:00 AM Medical Record Number: [id number] Patient Account Number: [id number] Date of Birth/Gender: 08-Nov-1961 (56 y.o. Female) Treating RN: Renne Crigler Primary Care Physician: Joen [name] Other Clinician: Referring Physician: Joen [name] Treating Physician/Extender: Kathreen Cosier in Treatment: 10 Education Assessment Education Provided To: Patient Education Topics Provided Wound/Skin Impairment: Handouts: Caring for Your Ulcer Methods: Explain/Verbal Responses: State content correctly Electronic Signature(s) Signed: 03/01/2018 4:51:26 PM By: Renne Crigler Entered By: Renne Crigler on 03/01/2018 08:22:01 Moreland, Jariana Elbert Ewings ([id number]) -------------------------------------------------------------------------------- Wound Assessment Details Patient Name: [name] Date of Service: 03/01/2018 8:00 AM Medical Record Number: [id number] Patient Account Number: [id number] Date of Birth/Sex: 04-Oct-1961 (57 y.o. Female) Treating RN: Renne Crigler Primary Care Perpetua Elling: Joen [name] Other Clinician: Referring Ra Pfiester: Joen [name] Treating Kais Monje/Extender: Kathreen Cosier in Treatment: 75 Wound Status Wound Number: 3 Primary Pressure Ulcer Etiology: Wound Location: Right  Gluteal fold Wound Status: Open Wounding Event: Pressure Injury Comorbid Asthma, Hypertension, Type II Diabetes, Date Acquired: 04/21/2016 History: Neuropathy Weeks Of Treatment: 75 Clustered Wound: No Wound Measurements Length: (cm) 0.4 Width: (cm) 0.4 Depth: (cm) 1.4 Area: (cm) 0.126 Volume: (cm) 0.176 % Reduction in Area: 95.9% % Reduction in Volume: 99% Epithelialization: Small (1-33%) Tunneling: No Undermining: No Wound Description Classification: Category/Stage IV Wound Margin: Distinct, outline attached Exudate Amount: Medium Exudate Type: Serous Exudate Color: amber Foul Odor After Cleansing: No Slough/Fibrino Yes Wound Bed Granulation Amount: Large (67-100%) Exposed Structure Granulation Quality: Pink Fascia Exposed: No Necrotic  Amount: Small (1-33%) Fat Layer (Subcutaneous Tissue) Exposed: Yes Necrotic Quality: Adherent Slough Tendon Exposed: No Muscle Exposed: No Joint Exposed: No Bone Exposed: No Periwound Skin Texture Texture Color No Abnormalities Noted: No No Abnormalities Noted: No Callus: No Atrophie Blanche: No Crepitus: No Cyanosis: No Excoriation: No Ecchymosis: No Induration: No Erythema: No Rash: No Hemosiderin Staining: No Scarring: No Mottled: No Pallor: No Moisture Rubor: No No Abnormalities Noted: No Dry / Scaly: No Temperature / Pain Maceration: No Temperature: No Abnormality Tenderness on Palpation: Yes Halliday, Anu L. ([id number]) Wound Preparation Ulcer Cleansing: Rinsed/Irrigated with Saline Topical Anesthetic Applied: Other: lidocaine 4%, Treatment Notes Wound #3 (Right Gluteal fold) 1. Cleansed with: Clean wound with Normal Saline 4. Dressing Applied: Hydrogel Other dressing (specify in notes) 5. Secondary Dressing Applied Bordered Foam Dressing Notes cutimed sorbact Electronic Signature(s) Signed: 03/01/2018 4:51:26 PM By: Renne Crigler Entered By: Renne Crigler on 03/01/2018 08:20:37

## 2018-03-05 ENCOUNTER — Ambulatory Visit: Payer: BLUE CROSS/BLUE SHIELD

## 2018-03-06 ENCOUNTER — Encounter: Payer: BLUE CROSS/BLUE SHIELD | Admitting: Physician Assistant

## 2018-03-06 DIAGNOSIS — L89314 Pressure ulcer of right buttock, stage 4: Secondary | ICD-10-CM | POA: Diagnosis not present

## 2018-03-08 NOTE — Progress Notes (Signed)
JUDY, POLLMAN ([id number]) Visit Report for 03/06/2018 Chief Complaint Document Details Patient Name: [name], [name] Date of Service: 03/06/2018 8:00 AM Medical Record Number: [id number] Patient Account Number: [id number] Date of Birth/Sex: 1961/05/23 (57 y.o. Female) Treating RN: Ashok Cordia, Debi Primary Care Provider: Joen [name] Other Clinician: Referring Provider: Joen [name] Treating Provider/Extender: Linwood [name], [name] Weeks in Treatment: 75 Information Obtained from: Patient Chief Complaint Patient is here for follow-up of a relation of her right ischial pressure ulcer Electronic Signature(s) Signed: 03/07/2018 12:09:36 AM By: Lenda Kelp PA-C Entered By: Lenda Kelp on 03/06/2018 08:16:42 Lepp, [name] ([id number]) -------------------------------------------------------------------------------- Debridement Details Patient Name: [name] Date of Service: 03/06/2018 8:00 AM Medical Record Number: [id number] Patient Account Number: [id number] Date of Birth/Sex: 18-Feb-1961 (56 y.o. Female) Treating RN: Ashok Cordia, Debi Primary Care Provider: Joen [name] Other Clinician: Referring Provider: Joen [name] Treating Provider/Extender: Linwood [name], [name] Weeks in Treatment: 75 Debridement Performed for Wound #3 Right Gluteal fold Assessment: Performed By: Physician STONE III, [name] E., PA-C Debridement: Debridement Pre-procedure Verification/Time Yes - 08:21 Out Taken: Start Time: 08:22 Pain Control: Lidocaine 4% Topical Solution Level: Skin/Subcutaneous Tissue Total Area Debrided (L x W): 0.5 (cm) x 0.5 (cm) = 0.25 (cm) Tissue and other material Viable, Non-Viable, Exudate, Fibrin/Slough, Subcutaneous debrided: Instrument: Curette Bleeding: Minimum Hemostasis Achieved: Pressure End Time: 08:26 Procedural Pain: 0 Post Procedural Pain: 0 Response to Treatment: Procedure was tolerated well Post Debridement Measurements of Total Wound Length: (cm) 0.5 Stage:  Category/Stage IV Width: (cm) 0.5 Depth: (cm) 1.5 Volume: (cm) 0.295 Character of Wound/Ulcer Post Requires Further Debridement Debridement: Post Procedure Diagnosis Same as Pre-procedure Electronic Signature(s) Signed: 03/07/2018 12:09:36 AM By: Lenda Kelp PA-C Signed: 03/07/2018 8:38:15 AM By: Alejandro Mulling Entered By: Alejandro Mulling on 03/06/2018 08:28:24 [name], [name] ([id number]) -------------------------------------------------------------------------------- HPI Details Patient Name: [name] Date of Service: 03/06/2018 8:00 AM Medical Record Number: [id number] Patient Account Number: [id number] Date of Birth/Sex: 1961/10/24 (57 y.o. Female) Treating RN: Ashok Cordia, Debi Primary Care Provider: Joen [name] Other Clinician: Referring Provider: Joen [name] Treating Provider/Extender: Linwood [name], [name] Weeks in Treatment: 75 History of Present Illness HPI Description: 57 year old patient who was seen by visiting [name] wound care specialist for a wound on both her buttock and was found to have an unstageable wound on the right buttock for about 2 months. I understand that she had a fall and was laying on the floor for about 48 hours before she was found and taken to the ICU and had a long injury to her gluteal area from pressure and also had broken her right humerus. She has had a right proximal humerus fracture and has been followed up with orthopedics recently. The patient has a past medical history of type 2 diabetes mellitus, paraparesis, acute pyelonephritis, GERD, hypertension, glaucoma, chronic pain, anxiety neurosis, nicotine dependence, COPD. the patient had some debridement done and was to operative was recommended to use Silvadene dressing and offloading. She is a smoker and occasionally smokes a few cigarettes. the patient requested a second opinion for months and is here to discuss her care. 09/21/16; the patient re-presents from home today for review of 3  different wounds. I note that she was seen in the clinic here in July at which time she had bilateral buttock wounds. It was apparently suggested at that time that she use a wound VAC bridged to both wounds just near the initial tuberosity's bilaterally which she refused. The history was a bit difficult to put together. Apparently  this patient became ill at the end of April of this year. She was found sitting on the floor she had apparently been for 2 days and subsequently admitted to hospital from 04/23/16 through 05/02/16 and at that point she was critically ill ultimately having sepsis secondary to UTI, nontraumatic rhabdomyolysis and diabetic ketoacidosis. She had acute renal failure and I think required ICU care including intubation. Patient states her wounds actually started at that point on the bilateral issue tuberosities however in reviewing the discharge summary from 5/8 I see no reference to wounds at that point. It did state that she had left lower extremity cellulitis however. Reviewing Epic I see no relevant x-rays. It would appear that her discharge creatinine was within the normal range and indeed on 9/15 her creatinine has remained normal. [facility] There the wounds on her bilateral Buttocks were dressed. Only just before her discharge from the nursing facility she developed an "knot" which was interpreted as cellulitis on the posterior aspect of her left knee she was given antibiotics. Apparently sometime late in July a this actually opened and became a wound at home health care was tending to however she is still having purulent drainage coming from this and by my understanding the wound depth is actually become unmeasurable. I am not really clear about what home health has been placing in any of these wound areas. The patient states that is something with silver and it. She is not been systemically unwell no fever or  chills her appetite is good. She is a diabetic poorly controlled however she states that her recent blood sugars at home have been in the low to mid 100s. 09/28/16 On evaluation today patient appears to continue to exhibit the 3 areas of ulceration that were noted previous. She did have an x-ray of the right pelvis which showed evidence of potential soft tissue infection but no obvious osteomyelitis. There was a discussion last office visit concerning the possibility of a wound VAC. Witth that being said the x-ray report suggested that an MRI may be more appropriate to further evaluate the extent. Subsequently in regard to the wound over the popliteal portion of the left lower extremity with tunneling at 12:00 the CT scan that was ordered was denied by insurance as they state the patient has not had x-rays prior to advanced imaging. Patient states that she is frustrated with the situation overall. 10/05/16 in the interval since I last saw this patient last week she has had the x-ray of the knee performed. I did review that x-ray today and fortunately shows no evidence of osteomyelitis or other acute abnormality at this point in time. She continues to have the opening iin the posterior left popliteal space with tracking proximal up the posterior thigh. Nothing seems to have worsened but it also seems to have not improved. The same is true in regard to the right pressure ulcer over the gluteal region which extends toward the ischium. The left gluteal pressure ulcer actually appears to be doing somewhat better my opinion there is some necrotic slough but overall this appears fairly well. She tells me thatt she has some discomfort especially when home health is helping her with dressing changes as they do not know her. At worse she rates her pain to be a 5 out of 10 right now it's more like a 1 out of 10. Kinn, Alaylah L. ([id number]) 10/07/16; still the patient has 3 different wound areas.  She has a deep  stage IV wound over her right ischial tuberosity. She is due to have an MRI next week. The wound over her left ischial tuberosity is more superficial and underwent debridement today. Finally she has a small open area in her left popliteal fossa the probes on measurably forward superiorly. Still a lot of drainage coming out of this. The CT scan that I ordered 3 weeks ago was questioned by her insurance company wanting a plain x-ray first. As I understand things result of this is nothing has been done in 3 weeks in terms of imaging the thigh and she has an MRI booked of this along with her pelvis for next week line 10/12/16; patient has a deep probing wound over the right ischiall tuberosity, stage III wound over the left visual tuberosity and a draining sinus in her left popliteal fossa. None of this much different from when I saw this 3 weeks ago. We have been using silver rope to the right ischial wound and a draining area in the left popliteal fossa. Plain silver alginate to the area on the left ischial tuberosity 10/19/16; the patient's wounds are essentially unchanged although the area on the left lower gluteal is actually improved. Our intake nurse noted drainage from the right initial tuberosity probing wound as well as the draining area in the left popliteal fossa. Both of these were cultured. She had x-rays I think at the insistence of her insurance company on 09/23/16 x-ray of the pelvis was not particularly helpful she did have soft tissue air over the right lower pelvis although with the depth of this wound this is not surprising. An x-ray of her left knee did not show any specific abnormalities. We are still using silver alginate to these wound areas. Her MRI is booked for 10/27 10/26/16; cultures of the purulent drainage in her right initial tuberosity wound grew moderate Proteus and few staph aureus. The same organisms were cultured out of the left knee sinus tract posteriorly. The staph  aureus is MRSA. I had started her on Augmentin last week I added doxycycline. The MRI of the left lower extremity and pelvis was finally done. The MRI of the femur showed subcutaneous soft tissue swelling edema fluid and myositis in the vastus lateralis muscle but no soft tissue abscess septic arthritis or osteomyelitis. MRI of the pelvis showed the left wound to be more expensive extending down to the bone there was osteomyelitis. Left hamstring tendons were also involved. No septic arthritis involving the hip. The decubitus ulcer on the right side showed no definite osteomyelitis or abscess.. The right hip wound is actually the one the probes 6 cm downward. But the MRI showing infection including osteomyelitis on the left explains the draining sinus in the popliteal fossa on the left. She did have antibiotics in the hospitalization last time and this extended into her nursing home stay but I'm not exactly sure what antibiotics and for what duration. According the patient this did include vancomycin with considerable effort of our staff we are able to get the patient into see Dr. Sampson Goon today. There were transportation difficulties. Her mother had open heart surgery and is in the ICU in [place] therefore her brother was unable to transport. Dr. Jarrett Ables office graciously arranged time to see her today. From my point of view she is going to require IV vancomycin plus perhaps a third generation cephalosporin. I plan to keep her on doxycycline and Augmentin until the IV antibiotics can be arranged.  11/02/2016 - Clella presents today for management of ulcers; She saw Dr. 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 She is to have the PICC line placed today and receive the initial dose for both antibiotics today. She has yet to receive the offloading chair cushion and/or mattress overlay from home  health, apparently this has been a 3 week process. I encouraged her to speak to home health regarding this matter, along with offering home health to contact the wouns care center with any questions or concerns. The left ischial pressure ulcer continues to imporve, while to right ischial ulcer has increased in depth. The popliteal fossa sinus tract remains unmeasurable due to the limitation of depth measurement (tract extends beyond our measuring devices). 11-16-2016 Ms. Mazzocco presents today for evaluation and management of bilateral ischial stage IV pressure ulcers and sinus tract to the left popliteal fossa. she is under the care of Dr. Sampson Goon for IV antibiotic therapy; she states that the vancomycin was placed on hold and will be restarted at a lower dose based on her renal function. She continues taking Zosyn in addition to the vancomycin. She also states that she has yet to receive offloading cushions from home health, according to her she does not qualify for these offloading cushions because "the ulcers are unstageable ". We will contact the home health agency today to lend clarity regarding her pressure ulcers. The left popliteal fossa sinus tract continues to be a measurable as it extends beyond the length of our measuring devices.. 11/30/16; the patient is still on vancomycin and Zosyn. The depth of the draining sinus behind her left popliteal fossa is down to 4 cm although there is still serosanguineous drainage coming out of this. She saw Dr. Sampson Goon of infectious disease yesterday the idea is to weeks more of IV antibiotics and then oral antibiotics although I have not read his note. The area on the left gluteal fold is just about healed. She has a 6 cm draining sinus over the right initial tuberosity although I cannot feel bone at the base of this. As far as the patient is aware she has not had a recheck of her inflammatory markers. 12/07/16; patient is on vancomycin and Zosyn  appointment with Dr. Sampson Goon on the 19th at which point the patient expects to have a change in antibiotics. Remarkable improvement over the wound over the left ischial tuberosity which is just about closed. The draining sinus in her popliteal fossa has 0.4 cm in depth. The area on the right ischial tuberosity still probes down 7 cm. This is closed and overall wound dimensions but not depth. 12/14/16; patient is completing her vancomycin and Zosyn and per her she is going to transition to Bactrim and Augmentin for another 3 weeks. The area on her left gluteal fold is closed except for some skin tears. The area behind her left knee is no longer has any depth. The only remaining area that is of clinical concern is on the right gluteal fold probing towards the right [name], [name] ([id number]) ischial tuberosity. Today this measures 6.9 cm in depth. Very gritty surface 12/21/16; patient is now on Bactrim and Augmentin as directed by infectious disease. This should be for another 2 weeks. The area in her left gluteal fold and left popliteal are closed over and fully healed. Measurements today at 7 cm in the right buttock wound is unchanged from last week. 12/27/16; patient is on  Bactrim and Augmentin for another week as directed by infectious disease. She has completed her IV antibiotics. She is not been systemically unwell no fever no chills. The area on her right buttock measured over 6 cm in depth. There is no palpable bone. No evidence of surrounding soft tissue infection. She is complaining of tongue irritation and has a history of thrush 01/04/17; patient is been back to see Dr. Sampson Goon, her Augmentin was stopped but he continued the Bactrim for another 3 weeks. Depth of the wound is 6.7 cm there is been no major change in either direction. She is not receive the wound VAC from home health I think because of confusion about who is supposed to provided will actually talk to the home health  agency today [[facility] The net no major change. 01/18/17; patient obtained her wound VAC about 10 days ago however for some reason it was not actually put on the wound. She is therefore here for [place] to apply this I guess. No other issues are noted. She is not complaining of pain fever drainage 02/01/17; patient is here now having the wound VAC or 3-4 weeks to a deep pressure area over the right initial tuberosity. This measures 6 cm in depth today which is about half a centimeter better than 2 weeks ago. There is no evidence she is systemically unwell no fever no chills no pain around the area. 02/15/17; I follow this patient every 2 weeks for a deep area over the right ischial tuberosity. This measures 5.5 cm today which is a continued improvement of 1.2 cm from 1/10 and down 0.5 cm from her visit 2 weeks ago 03/01/17; continued difficult area over the right initial tuberosity using the wound VAC. Depth today of 5.1 cm which is improved. Does not appear to be a lot of drainage in the canister. Antibiotics were finally stopped by Dr. Sampson Goon bactrim[]  and inflammatory markers have been repeated 03/15/17; fall this lady every 2 weeks for a difficult area over her lower right gluteal area/ischial tuberosity. Depth today at 4.6 cm. This is a slow but steady improvement in the depth of this wound. Although we have labeled this as a pressure ulcer there may have been an underlying infection here at one point before we saw her. She had osteomyelitis on the left extensively which is since resolved 03/29/17- patient is here for follow-up evaluation of her right ischial pressure ulcer. She continues with the wound VAC and home health. According to the nurse home health has been using less foams and appropriate and we will instruct accordingly. The patient continues to smoke, approximately 10 cigarettes a day. She has been advised to decrease that in half by her next appointment, with a goal of complete  cessation. She states her blood sugars have been consistently less than 150. She states that she spends most of her day position left lateral or prone. She does have an air mattress on her bed, she does not have an mattress for her chair, she states she cannot afford this. 04/12/17; patient is here for evaluation of her right ischial pressure ulcer. We continue to use a wound VAC with minimal improvement today the depth of this measuring 4.4 cm versus 4.6 cm 2 weeks ago. We are using a [employer] wound VAC on this area. There is not excessive drainage no pain. The patient tells me she tries to keep off this in bed but is up in the wheelchair for 2 hours a day. She is limited in  no her overall ambulation but is improving and apparently is getting bilateral lower extremity braces which she hopes will improve her ability to walk independently. 05/10/17; Depth at 3.3cm. Improved 05/24/17; depth at 3.5 cm. This is not improved since last time. Not clear if they are using collagen under the foam 06/07/17; depth that 2.9 which is a slight improvement. Still using the wound VAC. 06/21/17; depth is 2.9 cm which is exactly the same as last time. Also the appearance of this wound is completely the same. We have been using silver collagen under a wound VAC. 07/05/17 patient presents today for reevaluation concerning her right Ischial wound. She had switched insurance companies and so it does appear that the Wound VAC needs to be reauthorized which we are working on this morning. Nonetheless her wound has been saying about the same we have continued to use the silver collagen underneath the wound VAC. She has no discomfort. 07/19/17; patient follows every 2 weeks for her right if she'll wound. Since I last saw this a month ago her dimensions of come down to 2.6 cm. This is slightly down from a month ago when it was 2.9 cm in 4 months ago it was 4.6 cm. I note that there were insurance company issues with regard to the wound  VAC which she apparently is not having on for several weeks now. I think it would be reasonable to change therapies here. Will use silver alginate rope 08/02/17 on evaluation today patient's wound actually appears to be doing significantly better in regard to the depth as compared to her previous evaluation. She is having no discomfort. Unfortunately she never got the Wound VAC reapproved following the insurance issues from several weeks ago. With that being said it does not appear that she needs to requires the Wound VAC anymore and in fact I feel that she is doing better and making greater improvements at this point without it. Fortunately she has no nausea, vomiting, diarrhea, fevers, or chills. 08/16/17; patient's wound depth down to 2.3 cm. She is using silver alginate packing rope. Note that her wound VAC was discontinued due to insurance issues. This is not particularly surprising. She has not been systemically unwell and has no other new complaints 08/30/17; no change in depth. Still at 2.3 cm. Still with the same gritty surface requiring debridement. I've been using silver [name], [name] ([id number]) alginate for quite a period of time although this came down nicely in the last month 10/04/17; the depth of this is 2 cm however with careful inspection under high-intensity light most of the walls of this small probing sinus seemed to be normally epithelialized. I cannot exactly see the base of this however there is been no drainage. We've been using silver alginate there is no drainage on the dressing when it is removed. 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 There is no evidence of surrounding infection or pain. The patient had many review weakness in her bilateral legs. She is already been to see a neurologist who according the patient told her "you would never walk again" 11/01/17; this is a patient I last saw a month ago. At that point the linear  tunnel in her right buttock was 2 cm. It was not possible to see the depth of this as skin had grown into the tunnel. In light of the fact that there was no drainage and no visible wound I recommended that we just allow her to go about her  usual activity without dressing. She reports that almost immediately she noted drainage on her clothing although in spite of her instructions the contrary she did not come back to the clinic. She has not been specifically addressing this and is continued to no drainage without other symptoms either local or systemic 11/29/17; I follow this woman monthly. Last time the wound on her right buttock was 1.8 cm today measuring at 1.4 there has been gradual improvement in this. Concerning is the fact that the patient says that Dr. Sampson Goon of infectious disease changed her from doxycycline to Augmentin apparently because of the elevated inflammatory markers. She also said he did a culture of this area but she is not heard the results. I don't think I have his information available on care everywhere however I will check this. She is using Hydrofera Blue rope. The patient and her intake nurse report that she is still having identifiable drainage which certainly makes it clear that this is not closed. 01/02/17 on evaluation today patient appears to be doing fairly well in regard to her left gluteal wound. She has been continuing with the [facility] Dressing's here in our office. We actually see her once a month and then subsequently are performing weekly dressing changes with Hydrofera Blue Dressing rope during nurse visits one time a week. Today there really was not much drainage at this point. However this has happened previously where she had no drainage and was essentially thought to be close internally although the external had not completely pulled together. However then she reopened and began draining again. He has been mentioned the possibility of her seeing  plastic surgery to try to get this finally and completely healed. 01/18/18 she is here in follow-up evaluation for right ischial pressure ulcer. She has an appointment with [facility] plastic surgery on 2/8 for evaluation. She continues to have minimal drainage at the tip of the dressing product. She continues to smoke, admits to 8-10 cigarettes per day. No change in treatment plan 02/06/18 on evaluation today patient appears to be doing about the same in regard to her right Ischial wound. She has been tolerating the dressing changes without complication. She did have her appointment at [facility] plastic surgery she was not really impressed. They stated that due to her weight they were unable to perform a flap and subsequently recommended the only thing they could attempt would be to exercise around the ulcer and then attempt to suture it together. The question is whether this would be effective or not. Patient really is not wanting to proceed down that road. Nonetheless she really have not changed much in regard to her wound measurements. 03/06/18 on evaluation today patient states that last week when I perform the silver nitrate that she actually had some burning pain for about two days following. Since that time the area has been very dry and she states that it is also been very dark and scabbed around the area. With that being said other than this she feels that the hope was the fact that it was burning was a good sign and that they would be improvement but the really does not appear to be significant improvement at this point. We have tried a lot of different dressings for her most recently Hydrofera Blue Dressing, silver alginate rope, and collagen. We have never attempted Endoform which I think could be a possibility for her. Electronic Signature(s) Signed: 03/07/2018 12:09:36 AM By: Lenda Kelp PA-C Entered By: Lenda Kelp on  03/06/2018 08:37:17 Sarris, [name]  ([id number]) -------------------------------------------------------------------------------- Physical Exam Details Patient Name: KELITA, BECHTEL Date of Service: 03/06/2018 8:00 AM Medical Record Number: [id number] Patient Account Number: [id number] Date of Birth/Sex: 1961-01-01 (57 y.o. Female) Treating RN: Ashok Cordia, Debi Primary Care Provider: Joen [name] Other Clinician: Referring Provider: Joen [name] Treating Provider/Extender: STONE III, [name] Weeks in Treatment: 75 Constitutional Obese and well-hydrated in no acute distress. Respiratory normal breathing without difficulty. Psychiatric this patient is able to make decisions and demonstrates good insight into disease process. Alert and Oriented x 3. pleasant and cooperative. Notes Patient's wound did have significant scab and eschar overlying the surface area which I perform silver nitrate on two weeks ago. This did require sharp debridement to remove there was some Slough in the base of the wound as well. Post debridement the wound appear to be doing much better. Patient tolerated this today without complication. The depth of the wound still has not really shifted. Electronic Signature(s) Signed: 03/07/2018 12:09:36 AM By: Lenda Kelp PA-C Entered By: Lenda Kelp on 03/06/2018 08:38:13 [name], [name] ([id number]) -------------------------------------------------------------------------------- Physician Orders Details Patient Name: [name] Date of Service: 03/06/2018 8:00 AM Medical Record Number: [id number] Patient Account Number: [id number] Date of Birth/Sex: 08/04/1961 (56 y.o. 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 Anesthetic (add to Medication List) Wound #3 Right Gluteal fold o Topical Lidocaine 4% cream applied to wound bed prior to debridement (In Clinic Only). Skin Barriers/Peri-Wound Care Wound #3 Right Gluteal fold o Skin Prep Primary Wound Dressing Wound #3 Right Gluteal fold o Hydrogel o Other: - endoform Secondary Dressing Wound #3 Right Gluteal fold o Drawtex o [place] Dressing Change Frequency Wound #3 Right Gluteal fold o Change dressing every week Follow-up Appointments o Return Appointment in 2 weeks. - MD appts opposites weeks o Nurse Visit as needed - opposite weeks Off-Loading Wound #3 Right Gluteal fold o Turn and reposition every 2 hours Additional Orders / Instructions Wound #3 Right Gluteal fold Nihiser, Magdelyn L. ([id number]) o Increase protein intake. Patient Medications Allergies: Biaxin Notifications Medication Indication Start End lidocaine DOSE 1 - topical 4 % cream - 1 cream topical Electronic Signature(s) Signed: 03/07/2018 12:09:36 AM By: Lenda Kelp PA-C Signed: 03/07/2018 8:38:15 AM By: Alejandro Mulling Entered By: Alejandro Mulling on 03/06/2018 08:29:40 [name], [name] ([id number]) -------------------------------------------------------------------------------- Prescription 03/06/2018 Patient Name: [name] Provider: Lenda Kelp PA-C Date of Birth: Apr 07, 1961 NPI#: [id number] Sex: F DEA#: [id number] Phone #: [phone] License #: Patient Address: [facility] [address] [facility] [place], [place] [address] [address], [address] [place], [place] [address] [phone] Allergies Biaxin Medication Medication: Route: Strength: Form: lidocaine topical 4%  cream Class: TOPICAL LOCAL ANESTHETICS Dose: Frequency / Time: Indication: 1 1 cream topical Number of Refills: Number of Units: 0 Generic Substitution: Start Date: End Date: Administered at Substitution Permitted Facility: Yes Time Administered: Time Discontinued: Note to Pharmacy: Signature(s): Date(s): Electronic Signature(s) Signed: 03/07/2018 12:09:36 AM By: Lenda Kelp PA-C Signed: 03/07/2018 8:38:15 AM By: Alejandro Mulling Entered By: Alejandro Mulling on 03/06/2018 08:29:41  [name], [name] ([id number]) [name], [name] --------------------------------------------------------------------------------  Problem List Details Patient Name: [name], [name] Date of Service: 03/06/2018 8:00 AM Medical Record Number: [id number] Patient Account Number: [id number] Date of Birth/Sex: 16-Jan-1961 (57 y.o. 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 09/21/2016 09/21/2016 Resolved Problems ICD-10 Code Description Active Date Resolved Date L89.323 Pressure ulcer of left buttock, stage 3 09/21/2016 09/21/2016 L89.324 Pressure ulcer of left buttock, stage 4 11/16/2016 11/16/2016 Electronic Signature(s) Signed: 03/07/2018 12:09:36 AM By: Lenda Kelp PA-C Entered By: Lenda Kelp on 03/06/2018 08:16:35 [name], [name]  ([id number]) -------------------------------------------------------------------------------- Progress Note Details Patient Name: [name] Date of Service: 03/06/2018 8:00 AM Medical Record Number: [id number] Patient Account Number: [id number] Date of Birth/Sex: February 05, 1961 (56 y.o. Female) Treating RN: Ashok Cordia, Debi Primary Care Provider: Joen [name] Other Clinician: Referring Provider: Joen [name] Treating Provider/Extender: Linwood [name], [name] Weeks in Treatment: 75 Subjective Chief Complaint Information obtained from Patient Patient is here for follow-up of a relation of her right ischial pressure ulcer History of Present Illness (HPI) 57 year old patient who was seen by visiting [name] wound care specialist for a wound on both her buttock and was found to have an unstageable wound on the right buttock for about 2 months. I understand that she had a fall and was laying on the floor for about 48 hours before she was found and taken to the ICU and had a long injury to her gluteal area from pressure and also had broken her right humerus. She has had a right proximal humerus fracture and has been followed up with orthopedics recently. The patient has a past medical history of type 2 diabetes mellitus, paraparesis, acute pyelonephritis, GERD, hypertension, glaucoma, chronic pain, anxiety neurosis, nicotine dependence, COPD. the patient had some debridement done and was to operative was recommended to use Silvadene dressing and offloading. She is a smoker and occasionally smokes a few cigarettes. the patient requested a second opinion for months and is here to discuss her care. 09/21/16; the patient re-presents from home today for review of 3 different wounds. I note that she was seen in the clinic here in July at which time she had bilateral buttock wounds. It was apparently suggested at that time that she use a wound VAC bridged to both wounds just near the initial tuberosity's  bilaterally which she refused. The history was a bit difficult to put together. Apparently this patient became ill at the end of April of this year. She was found sitting on the floor she had apparently been for 2 days and subsequently admitted to hospital from 04/23/16 through 05/02/16 and at that point she was critically ill ultimately having sepsis secondary to UTI, nontraumatic rhabdomyolysis and diabetic ketoacidosis. She had acute renal failure and I think required ICU care including intubation. Patient states her wounds actually started at that point on the bilateral issue tuberosities however in reviewing the discharge summary from 5/8 I see no reference to wounds at that point. It did state that she had left lower extremity cellulitis however. Reviewing Epic I see no  relevant x-rays. It would appear that her discharge creatinine was within the normal range and indeed on 9/15 her creatinine has remained normal. [facility] There the wounds on her bilateral Buttocks were dressed. Only just before her discharge from the nursing facility she developed an "knot" which was interpreted as cellulitis on the posterior aspect of her left knee she was given antibiotics. Apparently sometime late in July a this actually opened and became a wound at home health care was tending to however she is still having purulent drainage coming from this and by my understanding the wound depth is actually become unmeasurable. I am not really clear about what home health has been placing in any of these wound areas. The patient states that is something with silver and it. She is not been systemically unwell no fever or chills her appetite is good. She is a diabetic poorly controlled however she states that her recent blood sugars at home have been in the low to mid 100s. 09/28/16 On evaluation today patient appears to continue to exhibit the 3 areas of ulceration  that were noted previous. She did have an x-ray of the right pelvis which showed evidence of potential soft tissue infection but no obvious osteomyelitis. There was a discussion last office visit concerning the possibility of a wound VAC. Witth that being said the x-ray report suggested that an MRI may be more appropriate to further evaluate the extent. Subsequently in regard to the wound over the popliteal portion of the left lower extremity with tunneling at 12:00 the CT scan that was ordered was denied by insurance as they state the patient has not had x-rays prior to advanced imaging. Patient states that she is frustrated with the situation overall. 10/05/16 in the interval since I last saw this patient last week she has had the x-ray of the knee performed. I did review that x-ray today and fortunately shows no evidence of osteomyelitis or other acute abnormality at this point in time. She continues to have the opening iin the posterior left popliteal space with tracking proximal up the posterior thigh. Nothing seems to have Glasscock, Carnetta L. ([id number]) worsened but it also seems to have not improved. The same is true in regard to the right pressure ulcer over the gluteal region which extends toward the ischium. The left gluteal pressure ulcer actually appears to be doing somewhat better my opinion there is some necrotic slough but overall this appears fairly well. She tells me thatt she has some discomfort especially when home health is helping her with dressing changes as they do not know her. At worse she rates her pain to be a 5 out of 10 right now it's more like a 1 out of 10. 10/07/16; still the patient has 3 different wound areas. She has a deep stage IV wound over her right ischial tuberosity. She is due to have an MRI next week. The wound over her left ischial tuberosity is more superficial and underwent debridement today. Finally she has a small open area in her left popliteal fossa the  probes on measurably forward superiorly. Still a lot of drainage coming out of this. The CT scan that I ordered 3 weeks ago was questioned by her insurance company wanting a plain x-ray first. As I understand things result of this is nothing has been done in 3 weeks in terms of imaging the thigh and she has an MRI booked of this along with  her pelvis for next week line 10/12/16; patient has a deep probing wound over the right ischiall tuberosity, stage III wound over the left visual tuberosity and a draining sinus in her left popliteal fossa. None of this much different from when I saw this 3 weeks ago. We have been using silver rope to the right ischial wound and a draining area in the left popliteal fossa. Plain silver alginate to the area on the left ischial tuberosity 10/19/16; the patient's wounds are essentially unchanged although the area on the left lower gluteal is actually improved. Our intake nurse noted drainage from the right initial tuberosity probing wound as well as the draining area in the left popliteal fossa. Both of these were cultured. She had x-rays I think at the insistence of her insurance company on 09/23/16 x-ray of the pelvis was not particularly helpful she did have soft tissue air over the right lower pelvis although with the depth of this wound this is not surprising. An x-ray of her left knee did not show any specific abnormalities. We are still using silver alginate to these wound areas. Her MRI is booked for 10/27 10/26/16; cultures of the purulent drainage in her right initial tuberosity wound grew moderate Proteus and few staph aureus. The same organisms were cultured out of the left knee sinus tract posteriorly. The staph aureus is MRSA. I had started her on Augmentin last week I added doxycycline. The MRI of the left lower extremity and pelvis was finally done. The MRI of the femur showed subcutaneous soft tissue swelling edema fluid and myositis in the vastus  lateralis muscle but no soft tissue abscess septic arthritis or osteomyelitis. MRI of the pelvis showed the left wound to be more expensive extending down to the bone there was osteomyelitis. Left hamstring tendons were also involved. No septic arthritis involving the hip. The decubitus ulcer on the right side showed no definite osteomyelitis or abscess.. The right hip wound is actually the one the probes 6 cm downward. But the MRI showing infection including osteomyelitis on the left explains the draining sinus in the popliteal fossa on the left. She did have antibiotics in the hospitalization last time and this extended into her nursing home stay but I'm not exactly sure what antibiotics and for what duration. According the patient this did include vancomycin with considerable effort of our staff we are able to get the patient into see Dr. Sampson Goon today. There were transportation difficulties. Her mother had open heart surgery and is in the ICU in [place] therefore her brother was unable to transport. Dr. Jarrett Ables office graciously arranged time to see her today. From my point of view she is going to require IV vancomycin plus perhaps a third generation cephalosporin. I plan to keep her on doxycycline and Augmentin until the IV antibiotics can be arranged. 11/02/2016 - Keyonni presents today for management of ulcers; She saw Dr. 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 She is to have the PICC line placed today and receive the initial dose for both antibiotics today. She has yet to receive the offloading chair cushion and/or mattress overlay from home health, apparently this has been a 3 week process. I encouraged her to speak to home health regarding this matter, along with offering home health to contact the wouns care center with any questions or concerns. The left ischial pressure ulcer  continues to imporve, while to right ischial ulcer has  increased in depth. The popliteal fossa sinus tract remains unmeasurable due to the limitation of depth measurement (tract extends beyond our measuring devices). 11-16-2016 Ms. Rybolt presents today for evaluation and management of bilateral ischial stage IV pressure ulcers and sinus tract to the left popliteal fossa. she is under the care of Dr. Sampson Goon for IV antibiotic therapy; she states that the vancomycin was placed on hold and will be restarted at a lower dose based on her renal function. She continues taking Zosyn in addition to the vancomycin. She also states that she has yet to receive offloading cushions from home health, according to her she does not qualify for these offloading cushions because "the ulcers are unstageable ". We will contact the home health agency today to lend clarity regarding her pressure ulcers. The left popliteal fossa sinus tract continues to be a measurable as it extends beyond the length of our measuring devices.. 11/30/16; the patient is still on vancomycin and Zosyn. The depth of the draining sinus behind her left popliteal fossa is down to 4 cm although there is still serosanguineous drainage coming out of this. She saw Dr. Sampson Goon of infectious disease yesterday the idea is to weeks more of IV antibiotics and then oral antibiotics although I have not read his note. The area on the left gluteal fold is just about healed. She has a 6 cm draining sinus over the right initial tuberosity although I cannot feel bone at the base of this. As far as the patient is aware she has not had a recheck of her inflammatory markers. 12/07/16; patient is on vancomycin and Zosyn appointment with Dr. Sampson Goon on the 19th at which point the patient expects Manzer, Jaylynne L. ([id number]) to have a change in antibiotics. Remarkable improvement over the wound over the left ischial tuberosity which is just about closed. The  draining sinus in her popliteal fossa has 0.4 cm in depth. The area on the right ischial tuberosity still probes down 7 cm. This is closed and overall wound dimensions but not depth. 12/14/16; patient is completing her vancomycin and Zosyn and per her she is going to transition to Bactrim and Augmentin for another 3 weeks. The area on her left gluteal fold is closed except for some skin tears. The area behind her left knee is no longer has any depth. The only remaining area that is of clinical concern is on the right gluteal fold probing towards the right ischial tuberosity. Today this measures 6.9 cm in depth. Very gritty surface 12/21/16; patient is now on Bactrim and Augmentin as directed by infectious disease. This should be for another 2 weeks. The area in her left gluteal fold and left popliteal are closed over and fully healed. Measurements today at 7 cm in the right buttock wound is unchanged from last week. 12/27/16; patient is on Bactrim and Augmentin for another week as directed by infectious disease. She has completed her IV antibiotics. She is not been systemically unwell no fever no chills. The area on her right buttock measured over 6 cm in depth. There is no palpable bone. No evidence of surrounding soft tissue infection. She is complaining of tongue irritation and has a history of thrush 01/04/17; patient is been back to see Dr. Sampson Goon, her Augmentin was stopped but he continued the Bactrim for another 3 weeks. Depth of the wound is 6.7 cm there is been no major change in either direction. She is not receive the wound VAC from home health I think because  of confusion about who is supposed to provided will actually talk to the home health agency today [[facility] The net no major change. 01/18/17; patient obtained her wound VAC about 10 days ago however for some reason it was not actually put on the wound. She is therefore here for [place] to apply this I guess. No other issues are noted.  She is not complaining of pain fever drainage 02/01/17; patient is here now having the wound VAC or 3-4 weeks to a deep pressure area over the right initial tuberosity. This measures 6 cm in depth today which is about half a centimeter better than 2 weeks ago. There is no evidence she is systemically unwell no fever no chills no pain around the area. 02/15/17; I follow this patient every 2 weeks for a deep area over the right ischial tuberosity. This measures 5.5 cm today which is a continued improvement of 1.2 cm from 1/10 and down 0.5 cm from her visit 2 weeks ago 03/01/17; continued difficult area over the right initial tuberosity using the wound VAC. Depth today of 5.1 cm which is improved. Does not appear to be a lot of drainage in the canister. Antibiotics were finally stopped by Dr. Sampson Goon bactrim[]  and inflammatory markers have been repeated 03/15/17; fall this lady every 2 weeks for a difficult area over her lower right gluteal area/ischial tuberosity. Depth today at 4.6 cm. This is a slow but steady improvement in the depth of this wound. Although we have labeled this as a pressure ulcer there may have been an underlying infection here at one point before we saw her. She had osteomyelitis on the left extensively which is since resolved 03/29/17- patient is here for follow-up evaluation of her right ischial pressure ulcer. She continues with the wound VAC and home health. According to the nurse home health has been using less foams and appropriate and we will instruct accordingly. The patient continues to smoke, approximately 10 cigarettes a day. She has been advised to decrease that in half by her next appointment, with a goal of complete cessation. She states her blood sugars have been consistently less than 150. She states that she spends most of her day position left lateral or prone. She does have an air mattress on her bed, she does not have an mattress for her chair, she states she  cannot afford this. 04/12/17; patient is here for evaluation of her right ischial pressure ulcer. We continue to use a wound VAC with minimal improvement today the depth of this measuring 4.4 cm versus 4.6 cm 2 weeks ago. We are using a [employer] wound VAC on this area. There is not excessive drainage no pain. The patient tells me she tries to keep off this in bed but is up in the wheelchair for 2 hours a day. She is limited in no her overall ambulation but is improving and apparently is getting bilateral lower extremity braces which she hopes will improve her ability to walk independently. 05/10/17; Depth at 3.3cm. Improved 05/24/17; depth at 3.5 cm. This is not improved since last time. Not clear if they are using collagen under the foam 06/07/17; depth that 2.9 which is a slight improvement. Still using the wound VAC. 06/21/17; depth is 2.9 cm which is exactly the same as last time. Also the appearance of this wound is completely the same. We have been using silver collagen under a wound VAC. 07/05/17 patient presents today for reevaluation concerning her right Ischial wound. She had switched insurance  companies and so it does appear that the Wound VAC needs to be reauthorized which we are working on this morning. Nonetheless her wound has been saying about the same we have continued to use the silver collagen underneath the wound VAC. She has no discomfort. 07/19/17; patient follows every 2 weeks for her right if she'll wound. Since I last saw this a month ago her dimensions of come down to 2.6 cm. This is slightly down from a month ago when it was 2.9 cm in 4 months ago it was 4.6 cm. I note that there were insurance company issues with regard to the wound VAC which she apparently is not having on for several weeks now. I think it would be reasonable to change therapies here. Will use silver alginate rope 08/02/17 on evaluation today patient's wound actually appears to be doing significantly better in  regard to the depth as compared to her previous evaluation. She is having no discomfort. Unfortunately she never got the Wound VAC reapproved following the insurance issues from several weeks ago. With that being said it does not appear that she needs to requires the Glynn, Dariya L. ([id number]) Wound VAC anymore and in fact I feel that she is doing better and making greater improvements at this point without it. Fortunately she has no nausea, vomiting, diarrhea, fevers, or chills. 08/16/17; patient's wound depth down to 2.3 cm. She is using silver alginate packing rope. Note that her wound VAC was discontinued due to insurance issues. This is not particularly surprising. She has not been systemically unwell and has no other new complaints 08/30/17; no change in depth. Still at 2.3 cm. Still with the same gritty surface requiring debridement. I've been using silver alginate for quite a period of time although this came down nicely in the last month 10/04/17; the depth of this is 2 cm however with careful inspection under high-intensity light most of the walls of this small probing sinus seemed to be normally epithelialized. I cannot exactly see the base of this however there is been no drainage. We've been using silver alginate there is no drainage on the dressing when it is removed. 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 There is no evidence of surrounding infection or pain. The patient had many review weakness in her bilateral legs. She is already been to see a neurologist who according the patient told her "you would never walk again" 11/01/17; this is a patient I last saw a month ago. At that point the linear tunnel in her right buttock was 2 cm. It was not possible to see the depth of this as skin had grown into the tunnel. In light of the fact that there was no drainage and no visible wound I recommended that we just allow her to go about her usual  activity without dressing. She reports that almost immediately she noted drainage on her clothing although in spite of her instructions the contrary she did not come back to the clinic. She has not been specifically addressing this and is continued to no drainage without other symptoms either local or systemic 11/29/17; I follow this woman monthly. Last time the wound on her right buttock was 1.8 cm today measuring at 1.4 there has been gradual improvement in this. Concerning is the fact that the patient says that Dr. Sampson Goon of infectious disease changed her from doxycycline to Augmentin apparently because of the elevated inflammatory markers. She also said he did a culture of  this area but she is not heard the results. I don't think I have his information available on care everywhere however I will check this. She is using Hydrofera Blue rope. The patient and her intake nurse report that she is still having identifiable drainage which certainly makes it clear that this is not closed. 01/02/17 on evaluation today patient appears to be doing fairly well in regard to her left gluteal wound. She has been continuing with the [facility] Dressing's here in our office. We actually see her once a month and then subsequently are performing weekly dressing changes with Hydrofera Blue Dressing rope during nurse visits one time a week. Today there really was not much drainage at this point. However this has happened previously where she had no drainage and was essentially thought to be close internally although the external had not completely pulled together. However then she reopened and began draining again. He has been mentioned the possibility of her seeing plastic surgery to try to get this finally and completely healed. 01/18/18 she is here in follow-up evaluation for right ischial pressure ulcer. She has an appointment with [facility] plastic surgery on 2/8 for evaluation. She continues to have minimal  drainage at the tip of the dressing product. She continues to smoke, admits to 8-10 cigarettes per day. No change in treatment plan 02/06/18 on evaluation today patient appears to be doing about the same in regard to her right Ischial wound. She has been tolerating the dressing changes without complication. She did have her appointment at [facility] plastic surgery she was not really impressed. They stated that due to her weight they were unable to perform a flap and subsequently recommended the only thing they could attempt would be to exercise around the ulcer and then attempt to suture it together. The question is whether this would be effective or not. Patient really is not wanting to proceed down that road. Nonetheless she really have not changed much in regard to her wound measurements. 03/06/18 on evaluation today patient states that last week when I perform the silver nitrate that she actually had some burning pain for about two days following. Since that time the area has been very dry and she states that it is also been very dark and scabbed around the area. With that being said other than this she feels that the hope was the fact that it was burning was a good sign and that they would be improvement but the really does not appear to be significant improvement at this point. We have tried a lot of different dressings for her most recently Hydrofera Blue Dressing, silver alginate rope, and collagen. We have never attempted Endoform which I think could be a possibility for her. Patient History Information obtained from Patient. Family History Cancer - Mother, Diabetes - Father, Hypertension - Mother,Father, Stroke - Mother, No family history of Heart Disease, Kidney Disease, Lung Disease, Seizures, Thyroid Problems. Lyttle, HERMENA SWINT ([id number]) Social History Former smoker, Marital Status - Divorced, Alcohol Use - Never, Drug Use - Prior History, Caffeine Use - Rarely. Medical  History Hospitalization/Surgery History - 04/09/2016, [facility], [facility]. Review of Systems (ROS) Constitutional Symptoms (General Health) Denies complaints or symptoms of Fever, Chills. Respiratory The patient has no complaints or symptoms. Cardiovascular The patient has no complaints or symptoms. Psychiatric The patient has no complaints or symptoms. Objective Constitutional Obese and well-hydrated in no acute distress. Vitals Time Taken: 8:08 AM, Height: 63 in, Weight: 257 lbs, BMI: 45.5, Temperature: 98.4 F,  Pulse: 91 bpm, Respiratory Rate: 18 breaths/min, Blood Pressure: 139/58 mmHg. Respiratory normal breathing without difficulty. Psychiatric this patient is able to make decisions and demonstrates good insight into disease process. Alert and Oriented x 3. pleasant and cooperative. General Notes: Patient's wound did have significant scab and eschar overlying the surface area which I perform silver nitrate on two weeks ago. This did require sharp debridement to remove there was some Slough in the base of the wound as well. Post debridement the wound appear to be doing much better. Patient tolerated this today without complication. The depth of the wound still has not really shifted. Integumentary (Hair, Skin) Wound #3 status is Open. Original cause of wound was Pressure Injury. The wound is located on the Right Gluteal fold. The wound measures 0.5cm length x 0.5cm width x 1.4cm depth; 0.196cm^2 area and 0.275cm^3 volume. There is Fat Layer (Subcutaneous Tissue) Exposed exposed. There is no tunneling or undermining noted. There is a medium amount of serous drainage noted. The wound margin is distinct with the outline attached to the wound base. There is no granulation within the wound bed. There is a large (67-100%) amount of necrotic tissue within the wound bed including Eschar and Adherent Slough. The periwound skin appearance did not exhibit: Callus, Crepitus, Excoriation, Induration,  Rash, Scarring, Dry/Scaly, Maceration, Atrophie Blanche, Cyanosis, Ecchymosis, Hemosiderin Staining, Mottled, Pallor, Rubor, Erythema. Periwound temperature was noted as No Abnormality. The periwound has tenderness on palpation. [name], Altagracia L. 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 There was a Skin/Subcutaneous Tissue Debridement ([id number]) debridement with total area of 0.25 sq cm performed by STONE III, [name] E., PA-C. with the following instrument(s): Curette to remove Viable and Non-Viable tissue/material including Exudate, Fibrin/Slough, and Subcutaneous after achieving pain control using Lidocaine 4% Topical Solution. A time out was conducted at 08:21, prior to the start of the procedure. A Minimum amount of bleeding was controlled with Pressure. The procedure was tolerated well with a pain level of 0 throughout and a pain level of 0 following the procedure. Post Debridement Measurements: 0.5cm length x 0.5cm width x 1.5cm depth; 0.295cm^3 volume. Post debridement Stage noted as Category/Stage IV. Character of Wound/Ulcer Post Debridement requires further debridement. Post procedure Diagnosis Wound #3: Same as Pre-Procedure Plan Wound Cleansing: Wound #3 Right Gluteal fold: Clean wound with Normal Saline. Anesthetic (add to Medication List): Wound #3 Right Gluteal fold: Topical Lidocaine 4% cream applied to wound bed prior to debridement (In Clinic Only). Skin Barriers/Peri-Wound Care: Wound #3 Right Gluteal fold: Skin Prep Primary Wound Dressing: Wound #3 Right Gluteal fold: Hydrogel Other: - endoform Secondary Dressing: Wound #3 Right Gluteal fold: Drawtex [place] Dressing Change Frequency: Wound #3 Right Gluteal fold: Change  dressing every week Follow-up Appointments: Return Appointment in 2 weeks. - MD appts opposites weeks Nurse Visit as needed - opposite weeks Gilardi, Caitlyne L. ([id number]) Off-Loading: Wound #3 Right Gluteal fold: Turn and reposition every 2 hours Additional Orders / Instructions: Wound #3 Right Gluteal fold: Increase protein intake. The following medication(s) was prescribed: lidocaine topical 4 % cream 1 1 cream topical was prescribed at facility I am going to recommend at this point that we continue with the Current wound care measures as far as packing the wound although we are going to attempt in the form  at this point. We will perform the Endoform to the base of the wound and then pack Drawtex on top of this. Hydrogel will be used of the Endoform initially. We will see how this does for her. My hope is that the wound will start to fill in from the base out as this really cannot close into it feels in. Hopefully the silver nitrate was helpful in taking care of the outer surface layer of skin epithelialization down the tunnel so this will actually be able to heal. We will see were things stand in two weeks time when we see her for reevaluation she will have a nurse visit next week. Please see above for specific wound care orders. We will see patient for re-evaluation in 1 week(s) here in the clinic. If anything worsens or changes patient will contact our office for additional recommendations. Electronic Signature(s) Signed: 03/07/2018 12:09:36 AM By: Lenda Kelp PA-C Entered By: Lenda Kelp on 03/06/2018 08:39:40 Hatler, [name] ([id number]) -------------------------------------------------------------------------------- ROS/PFSH Details Patient Name: [name] Date of Service: 03/06/2018 8:00 AM Medical Record Number: [id number] Patient Account Number: [id number] Date of Birth/Sex: Jun 15, 1961 (56 y.o. Female) Treating RN: Ashok Cordia, Debi Primary Care Provider: Joen [name]  Other Clinician: Referring Provider: Joen [name] Treating Provider/Extender: Linwood [name], [name] Weeks in Treatment: 75 Information Obtained From Patient Wound History Do you currently have one or more open woundso Yes How many open wounds do you currently haveo 3 Approximately how long have you had your woundso since April 2017 How have you been treating your wound(s) until nowo silver Has your wound(s) ever healed and then re-openedo No Have you had any lab work done in the past montho No Have you tested positive for an antibiotic resistant organism (MRSA, VRE)o No Have you tested positive for osteomyelitis (bone infection)o No Have you had any tests for circulation on your legso No Have you had other problems associated with your woundso Swelling Constitutional Symptoms (General Health) Complaints and Symptoms: Negative for: Fever; Chills Eyes Medical History: Negative for: Cataracts; Glaucoma; Optic Neuritis Ear/Nose/Mouth/Throat Medical History: Negative for: Chronic sinus problems/congestion; Middle ear problems Hematologic/Lymphatic Medical History: Negative for: Anemia; Hemophilia; Human Immunodeficiency Virus; Lymphedema; Sickle Cell Disease Respiratory Complaints and Symptoms: No Complaints or Symptoms Medical History: Positive for: Asthma Negative for: Aspiration; Chronic Obstructive Pulmonary Disease (COPD); Pneumothorax; Sleep Apnea; Tuberculosis Cardiovascular Complaints and Symptoms: No Complaints or Symptoms Medical History: Positive for: Hypertension Dutson, Minahil L. ([id number]) Negative for: Angina; Arrhythmia; Congestive Heart Failure; Coronary Artery Disease; Deep Vein Thrombosis; Hypotension; Myocardial Infarction; Peripheral Arterial Disease; Peripheral Venous Disease; Phlebitis; Vasculitis Gastrointestinal Medical History: Negative for: Cirrhosis ; Colitis; Crohnos; Hepatitis A; Hepatitis B; Hepatitis C Endocrine Medical History: Positive for: Type II  Diabetes Negative for: Type I Diabetes Time with diabetes: 30 yrs Treated with: Insulin Blood sugar tested every day: Yes Tested : Blood sugar testing results: Breakfast: 121 Genitourinary Medical History: Negative for: End Stage Renal Disease Immunological Medical History: Negative for: Lupus Erythematosus; Raynaudos; Scleroderma Integumentary (Skin) Medical History: Negative for: History of Burn; History of pressure wounds Musculoskeletal Medical History: Negative for: Gout; Rheumatoid Arthritis; Osteoarthritis; Osteomyelitis Neurologic Medical History: Positive for: Neuropathy Negative for: Dementia; Quadriplegia; Paraplegia; Seizure Disorder Oncologic Medical History: Negative for: Received Chemotherapy; Received Radiation Psychiatric Complaints and Symptoms: No Complaints or Symptoms Medical History: Negative for: Anorexia/bulimia; Confinement Anxiety Gubler, Ryane L. ([id number]) Immunizations Pneumococcal Vaccine: Received Pneumococcal Vaccination: No Implantable Devices Hospitalization / Surgery History Name of Hospital Purpose of Hospitalization/Surgery Date [facility] AMS  04/09/2016 Family and Social History Cancer: Yes - Mother; Diabetes: Yes - Father; Heart Disease: No; Hypertension: Yes - Mother,Father; Kidney Disease: No; Lung Disease: No; Seizures: No; Stroke: Yes - Mother; Thyroid Problems: No; Former smoker; Marital Status - Divorced; Alcohol Use: Never; Drug Use: Prior History; Caffeine Use: Rarely; Advanced Directives: No; Patient does not want information on Advanced Directives; Living Will: No; Medical Power of Attorney: No Physician Affirmation I have reviewed and agree with the above information. Electronic Signature(s) Signed: 03/07/2018 12:09:36 AM By: Lenda Kelp PA-C Signed: 03/07/2018 8:38:15 AM By: Alejandro Mulling Entered By: Lenda Kelp on 03/06/2018 08:37:50 Cura, [name]  ([id number]) -------------------------------------------------------------------------------- SuperBill Details Patient Name: [name] Date of Service: 03/06/2018 Medical Record Number: [id number] Patient Account Number: [id number] Date of Birth/Sex: 10-05-1961 (57 y.o. Female) Treating RN: Ashok Cordia, Debi Primary Care Provider: Joen [name] Other Clinician: Referring Provider: Joen [name] Treating Provider/Extender: Linwood [name], [name] Weeks in Treatment: 75 Diagnosis Coding ICD-10 Codes Code Description L89.314 Pressure ulcer of right buttock, stage 4 E11.42 Type 2 diabetes mellitus with diabetic polyneuropathy Facility Procedures CPT4 Code: [id number] Description: 11042 - DEB SUBQ TISSUE 20 SQ CM/< ICD-10 Diagnosis Description L89.314 Pressure ulcer of right buttock, stage 4 Modifier: Quantity: 1 Physician Procedures CPT4 Code: [id number] Description: 11042 - WC PHYS SUBQ TISS 20 SQ CM ICD-10 Diagnosis Description L89.314 Pressure ulcer of right buttock, stage 4 Modifier: Quantity: 1 Electronic Signature(s) Signed: 03/07/2018 12:09:36 AM By: Lenda Kelp PA-C Entered By: Lenda Kelp on 03/06/2018 08:39:47

## 2018-03-12 NOTE — Progress Notes (Signed)
[name], [name] ([id number]) Visit Report for 03/06/2018 Arrival Information Details Patient Name: [name], [name] Date of Service: 03/06/2018 8:00 AM Medical Record Number: [id number] Patient Account Number: [id number] Date of Birth/Sex: 1961/02/08 (57 y.o. Female) Treating RN: Roger Shelter Primary Care Janaysha Depaulo: Lavera Guise Other Clinician: Referring Nare Gaspari: Lavera Guise Treating Porshea Janowski/Extender: Melburn Hake, HOYT Weeks in Treatment: 30 Visit Information History Since Last Visit All ordered tests and consults were completed: No Patient Arrived: Wheel Chair Added or deleted any medications: No Arrival Time: 08:07 Any new allergies or adverse reactions: No Accompanied By: self Had a fall or experienced change in No Transfer Assistance: EasyPivot Patient activities of daily living that may affect Lift risk of falls: Patient Identification Verified: Yes Signs or symptoms of abuse/neglect since last visito No Secondary Verification Process Yes Hospitalized since last visit: No Completed: Pain Present Now: No Patient Requires Transmission-Based No Precautions: Patient Has Alerts: Yes Patient Alerts: DM II Electronic Signature(s) Signed: 03/07/2018 8:28:46 AM By: Roger Shelter Entered By: Roger Shelter on 03/06/2018 08:08:13 [name], [name] ([id number]) -------------------------------------------------------------------------------- Encounter Discharge Information Details Patient Name: [name] Date of Service: 03/06/2018 8:00 AM Medical Record Number: [id number] Patient Account Number: [id number] Date of Birth/Sex: 09-21-1961 (57 y.o. Female) Treating RN: Roger Shelter Primary Care Rachel Samples: Lavera Guise Other Clinician: Referring Rahkeem Senft: Lavera Guise Treating Mar Zettler/Extender: Melburn Hake, HOYT Weeks in Treatment: 52 Encounter Discharge Information Items Discharge Pain Level: 0 Discharge Condition: Stable Ambulatory Status: Wheelchair Discharge Destination:  Home Transportation: Other Accompanied By: driver Schedule Follow-up Appointment: Yes Medication Reconciliation completed and No provided to Patient/Care Fernand Sorbello: Patient Clinical Summary of Care: Declined Electronic Signature(s) Signed: 03/12/2018 2:38:37 PM By: Ruthine Dose Entered By: Ruthine Dose on 03/06/2018 08:47:07 Scullion, [name] ([id number]) -------------------------------------------------------------------------------- Lower Extremity Assessment Details Patient Name: [name] Date of Service: 03/06/2018 8:00 AM Medical Record Number: [id number] Patient Account Number: [id number] Date of Birth/Sex: 11/28/1961 (56 y.o. Female) Treating RN: Roger Shelter Primary Care Camdyn Laden: Lavera Guise Other Clinician: Referring Sye Schroepfer: Lavera Guise Treating Ricki Clack/Extender: Melburn Hake, HOYT Weeks in Treatment: 75 Electronic Signature(s) Signed: 03/07/2018 8:28:46 AM By: Roger Shelter Entered By: Roger Shelter on 03/06/2018 08:15:05 [name], [name] ([id number]) -------------------------------------------------------------------------------- Multi Wound Chart Details Patient Name: [name] Date of Service: 03/06/2018 8:00 AM Medical Record Number: [id number] Patient Account Number: [id number] Date of Birth/Sex: Dec 22, 1961 (57 y.o. Female) Treating RN: Carolyne Fiscal, Debi Primary Care Tivis Wherry: Lavera Guise Other Clinician: Referring Ali Mohl: Lavera Guise Treating Lavonya Hoerner/Extender: Melburn Hake, HOYT Weeks in Treatment: 75 Vital Signs Height(in): 38 Pulse(bpm): 91 Weight(lbs): 257 Blood Pressure(mmHg): 139/58 Body Mass Index(BMI): 46 Temperature(F): 98.4 Respiratory Rate 18 (breaths/min): Photos: [3:No Photos] [N/A:N/A] Wound Location: [3:Right Gluteal fold] [N/A:N/A] Wounding Event: [3:Pressure Injury] [N/A:N/A] Primary Etiology: [3:Pressure Ulcer] [N/A:N/A] Comorbid History: [3:Asthma, Hypertension, Type II Diabetes, Neuropathy] [N/A:N/A] Date Acquired:  [3:04/21/2016] [N/A:N/A] Weeks of Treatment: [3:75] [N/A:N/A] Wound Status: [3:Open] [N/A:N/A] Measurements L x W x D [3:0.5x0.5x1.4] [N/A:N/A] (cm) Area (cm) : [3:0.196] [N/A:N/A] Volume (cm) : [3:0.275] [N/A:N/A] % Reduction in Area: [3:93.60%] [N/A:N/A] % Reduction in Volume: [3:98.40%] [N/A:N/A] Classification: [3:Category/Stage IV] [N/A:N/A] Exudate Amount: [3:Medium] [N/A:N/A] Exudate Type: [3:Serous] [N/A:N/A] Exudate Color: [3:amber] [N/A:N/A] Wound Margin: [3:Distinct, outline attached] [N/A:N/A] Granulation Amount: [3:None Present (0%)] [N/A:N/A] Necrotic Amount: [3:Large (67-100%)] [N/A:N/A] Necrotic Tissue: [3:Eschar, Adherent Slough] [N/A:N/A] Exposed Structures: [3:Fat Layer (Subcutaneous Tissue) Exposed: Yes Fascia: No Tendon: No Muscle: No Joint: No Bone: No] [N/A:N/A] Epithelialization: [3:Small (1-33%)] [N/A:N/A] Periwound Skin Texture: [3:Excoriation: No Induration: No Callus: No Crepitus: No Rash: No Scarring: No] [  N/A:N/A] Periwound Skin Moisture: Maceration: No N/A N/A Dry/Scaly: No Periwound Skin Color: Atrophie Blanche: No N/A N/A Cyanosis: No Ecchymosis: No Erythema: No Hemosiderin Staining: No Mottled: No Pallor: No Rubor: No Temperature: No Abnormality N/A N/A Tenderness on Palpation: Yes N/A N/A Wound Preparation: Ulcer Cleansing: N/A N/A Rinsed/Irrigated with Saline Topical Anesthetic Applied: Other: lidocaine 4% Treatment Notes Electronic Signature(s) Signed: 03/07/2018 8:38:15 AM By: Alric Quan Entered By: Alric Quan on 03/06/2018 08:20:05 [name], [name] ([id number]) -------------------------------------------------------------------------------- Multi-Disciplinary Care Plan Details Patient Name: [name] Date of Service: 03/06/2018 8:00 AM Medical Record Number: [id number] Patient Account Number: [id number] Date of Birth/Sex: Jan 30, 1961 (56 y.o. Female) Treating RN: Carolyne Fiscal, Debi Primary Care Enoc Getter: Lavera Guise  Other Clinician: Referring Irisha Grandmaison: Lavera Guise Treating Jayron Maqueda/Extender: Melburn Hake, HOYT Weeks in Treatment: 55 Active Inactive ` Abuse / Safety / Falls / Self Care Management Nursing Diagnoses: Potential for falls Goals: Patient will remain injury free Date Initiated: 09/21/2016 Target Resolution Date: 03/25/2017 Goal Status: Active Interventions: Assess fall risk on admission and as needed Notes: ` Nutrition Nursing Diagnoses: Imbalanced nutrition Potential for alteratiion in Nutrition/Potential for imbalanced nutrition Goals: Patient/caregiver agrees to and verbalizes understanding of need to use nutritional supplements and/or vitamins as prescribed Date Initiated: 09/21/2016 Target Resolution Date: 03/25/2017 Goal Status: Active Patient/caregiver verbalizes understanding of need to maintain therapeutic glucose control per primary care physician Date Initiated: 09/21/2016 Target Resolution Date: 03/25/2017 Goal Status: Active Interventions: Assess patient nutrition upon admission and as needed per policy Notes: ` Orientation to the Wound Care Program Nursing Diagnoses: Knowledge deficit related to the wound healing center program Goals: Patient/caregiver will verbalize understanding of the [place], [name] ([id number]) Date Initiated: 09/21/2016 Target Resolution Date: 03/25/2017 Goal Status: Active Interventions: Provide education on orientation to the wound center Notes: ` Pain, Acute or Chronic Nursing Diagnoses: Pain, acute or chronic: actual or potential Potential alteration in comfort, pain Goals: Patient will verbalize adequate pain control and receive pain control interventions during procedures as needed Date Initiated: 09/21/2016 Target Resolution Date: 03/25/2017 Goal Status: Active Patient/caregiver will verbalize adequate pain control between visits Date Initiated: 09/21/2016 Target Resolution Date: 03/25/2017 Goal Status:  Active Patient/caregiver will verbalize comfort level met Date Initiated: 09/21/2016 Target Resolution Date: 03/25/2017 Goal Status: Active Interventions: Assess comfort goal upon admission Complete pain assessment as per visit requirements Notes: ` Wound/Skin Impairment Nursing Diagnoses: Impaired tissue integrity Goals: Ulcer/skin breakdown will have a volume reduction of 30% by week 4 Date Initiated: 09/21/2016 Target Resolution Date: 03/25/2017 Goal Status: Active Ulcer/skin breakdown will have a volume reduction of 50% by week 8 Date Initiated: 09/21/2016 Target Resolution Date: 03/25/2017 Goal Status: Active Ulcer/skin breakdown will have a volume reduction of 80% by week 12 Date Initiated: 09/21/2016 Target Resolution Date: 03/25/2017 Goal Status: Active Interventions: Assess ulceration(s) every visit Notes: [name], [name] ([id number]) Electronic Signature(s) Signed: 03/07/2018 8:38:15 AM By: Alric Quan Entered By: Alric Quan on 03/06/2018 08:19:57 [name], [name] ([id number]) -------------------------------------------------------------------------------- Pain Assessment Details Patient Name: [name] Date of Service: 03/06/2018 8:00 AM Medical Record Number: [id number] Patient Account Number: [id number] Date of Birth/Sex: 01/01/1961 (57 y.o. Female) Treating RN: Roger Shelter Primary Care Pablo Mathurin: Lavera Guise Other Clinician: Referring Mitchelle Sultan: Lavera Guise Treating Donald Jacque/Extender: Melburn Hake, HOYT Weeks in Treatment: 75 Active Problems Location of Pain Severity and Description of Pain Patient Has Paino No Site Locations Pain Management and Medication Current Pain Management: Electronic Signature(s) Signed: 03/07/2018 8:28:46 AM By: Roger Shelter Entered By: Roger Shelter on  03/06/2018 08:08:20 [name], [name] ([id number]) -------------------------------------------------------------------------------- Patient/Caregiver Education  Details Patient Name: [name] Date of Service: 03/06/2018 8:00 AM Medical Record Number: [id number] Patient Account Number: [id number] Date of Birth/Gender: 10-06-1961 (57 y.o. Female) Treating RN: Roger Shelter Primary Care Physician: Lavera Guise Other Clinician: Referring Physician: Lavera Guise Treating Physician/Extender: Sharalyn Ink in Treatment: 70 Education Assessment Education Provided To: Patient Education Topics Provided Wound Debridement: Handouts: Wound Debridement Methods: Explain/Verbal Responses: State content correctly Wound/Skin Impairment: Handouts: Caring for Your Ulcer Methods: Explain/Verbal Responses: State content correctly Electronic Signature(s) Signed: 03/07/2018 8:28:46 AM By: Roger Shelter Entered By: Roger Shelter on 03/06/2018 08:47:57 [name], [name] ([id number]) -------------------------------------------------------------------------------- Wound Assessment Details Patient Name: [name] Date of Service: 03/06/2018 8:00 AM Medical Record Number: [id number] Patient Account Number: [id number] Date of Birth/Sex: 1961/12/01 (57 y.o. Female) Treating RN: Roger Shelter Primary Care Jermone Geister: Lavera Guise Other Clinician: Referring Abeeha Twist: Lavera Guise Treating Jacynda Brunke/Extender: Melburn Hake, HOYT Weeks in Treatment: 75 Wound Status Wound Number: 3 Primary Pressure Ulcer Etiology: Wound Location: Right Gluteal fold Wound Status: Open Wounding Event: Pressure Injury Comorbid Asthma, Hypertension, Type II Diabetes, Date Acquired: 04/21/2016 History: Neuropathy Weeks Of Treatment: 75 Clustered Wound: No Wound Measurements Length: (cm) 0.5 Width: (cm) 0.5 Depth: (cm) 1.4 Area: (cm) 0.196 Volume: (cm) 0.275 % Reduction in Area: 93.6% % Reduction in Volume: 98.4% Epithelialization: Small (1-33%) Tunneling: No Undermining: No Wound Description Classification: Category/Stage IV Wound Margin: Distinct, outline  attached Exudate Amount: Medium Exudate Type: Serous Exudate Color: amber Foul Odor After Cleansing: No Slough/Fibrino Yes Wound Bed Granulation Amount: None Present (0%) Exposed Structure Necrotic Amount: Large (67-100%) Fascia Exposed: No Necrotic Quality: Eschar, Adherent Slough Fat Layer (Subcutaneous Tissue) Exposed: Yes Tendon Exposed: No Muscle Exposed: No Joint Exposed: No Bone Exposed: No Periwound Skin Texture Texture Color No Abnormalities Noted: No No Abnormalities Noted: No Callus: No Atrophie Blanche: No Crepitus: No Cyanosis: No Excoriation: No Ecchymosis: No Induration: No Erythema: No Rash: No Hemosiderin Staining: No Scarring: No Mottled: No Pallor: No Moisture Rubor: No No Abnormalities Noted: No Dry / Scaly: No Temperature / Pain Maceration: No Temperature: No Abnormality Tenderness on Palpation: Yes [name], [name] ([id number]) Wound Preparation Ulcer Cleansing: Rinsed/Irrigated with Saline Topical Anesthetic Applied: Other: lidocaine 4%, Treatment Notes Wound #3 (Right Gluteal fold) 1. Cleansed with: Clean wound with Normal Saline 2. Anesthetic Topical Lidocaine 4% cream to wound bed prior to debridement 4. Dressing Applied: Other dressing (specify in notes) 5. Secondary Dressing Applied Bordered Foam Dressing Notes endoform, drawtex Electronic Signature(s) Signed: 03/07/2018 8:28:46 AM By: Roger Shelter Entered By: Roger Shelter on 03/06/2018 08:14:52 Fierro, [name] ([id number]) -------------------------------------------------------------------------------- Vitals Details Patient Name: [name] Date of Service: 03/06/2018 8:00 AM Medical Record Number: [id number] Patient Account Number: [id number] Date of Birth/Sex: [birth year] (56 y.o. Female) Treating RN: Roger Shelter Primary Care Bailyn Spackman: Lavera Guise Other Clinician: Referring Alexx Mcburney: Lavera Guise Treating Miangel Flom/Extender: Melburn Hake, HOYT Weeks in  Treatment: 75 Vital Signs Time Taken: 08:08 Temperature (F): 98.4 Height (in): 63 Pulse (bpm): 91 Weight (lbs): 257 Respiratory Rate (breaths/min): 18 Body Mass Index (BMI): 45.5 Blood Pressure (mmHg): 139/58 Reference Range: 80 - 120 mg / dl Electronic Signature(s) Signed: 03/07/2018 8:28:46 AM By: Roger Shelter Entered By: Roger Shelter on 03/06/2018 08:08:40

## 2018-03-13 DIAGNOSIS — L89314 Pressure ulcer of right buttock, stage 4: Secondary | ICD-10-CM | POA: Diagnosis not present

## 2018-03-14 IMAGING — CR DG KNEE COMPLETE 4+V*L*
4 series · 4 of 4 positions shown · non-contrast
Comparison: None.

CLINICAL DATA: Pt here today because of a wound on the posterior
left knee that has been there since [REDACTED]. Pt stated that it was
actively draining today. Hx of diabetes, arthritis, hypertension.

EXAM:
LEFT KNEE - COMPLETE 4+ VIEW

[knee ap]
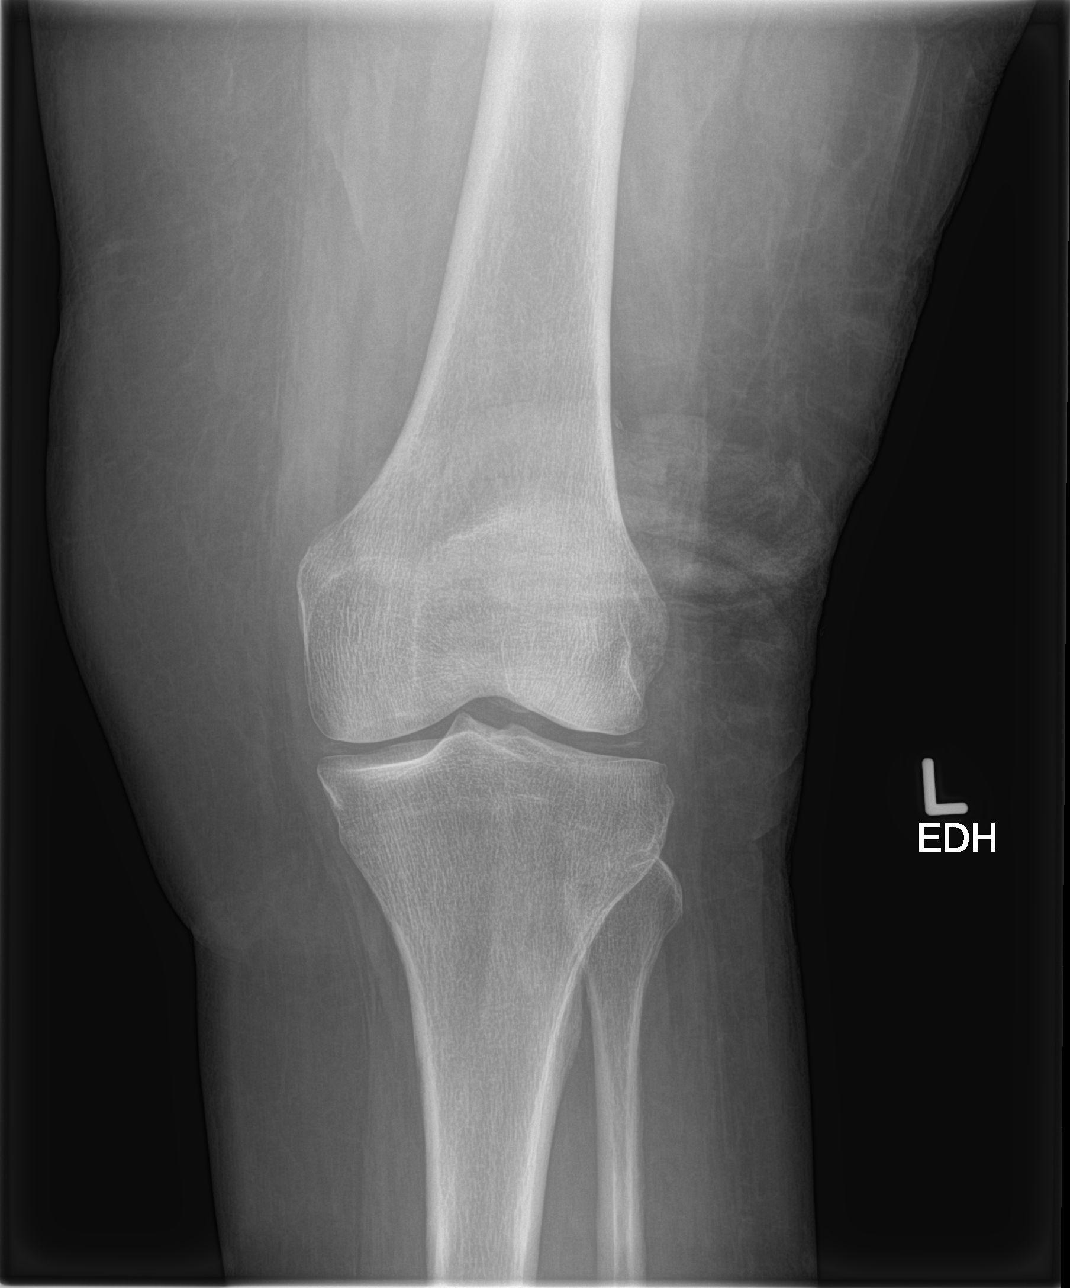

[knee obl (1 of 2)]
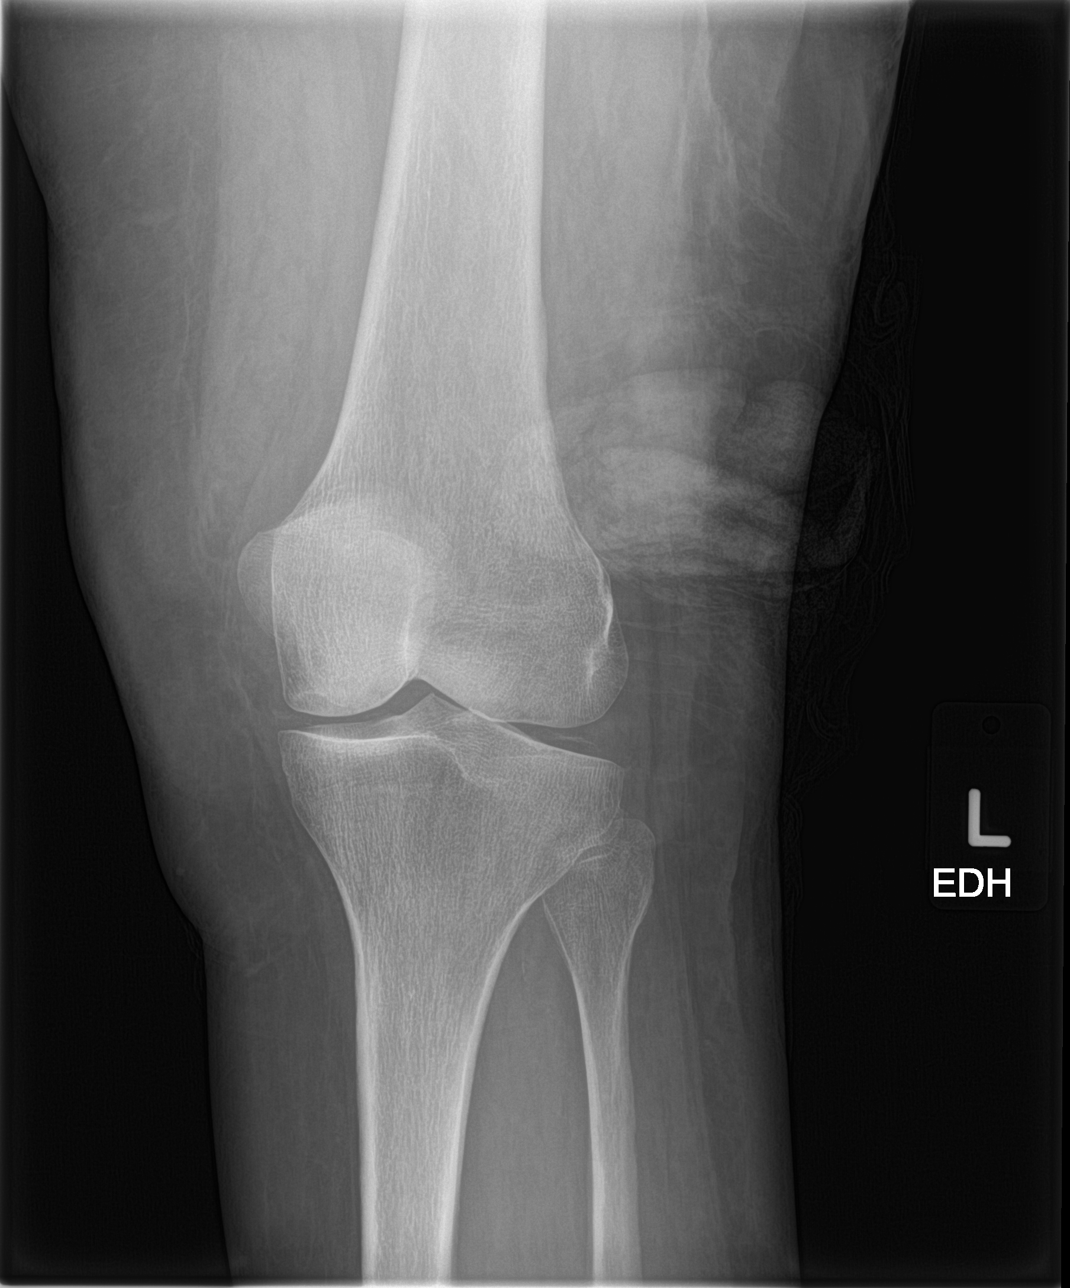

[knee obl (2 of 2)]
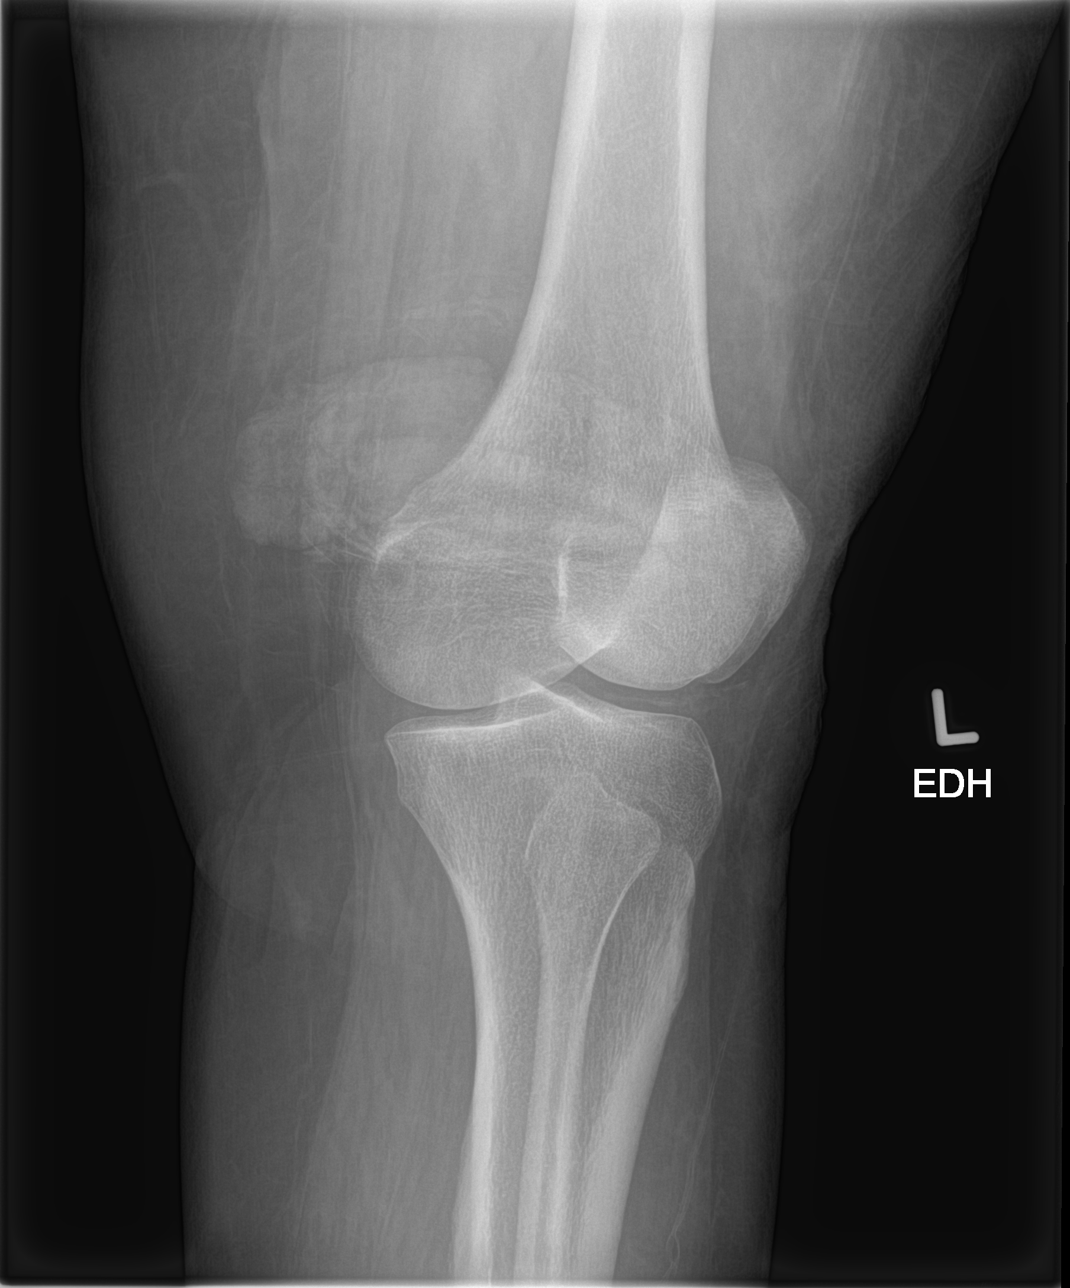

[knee lat]
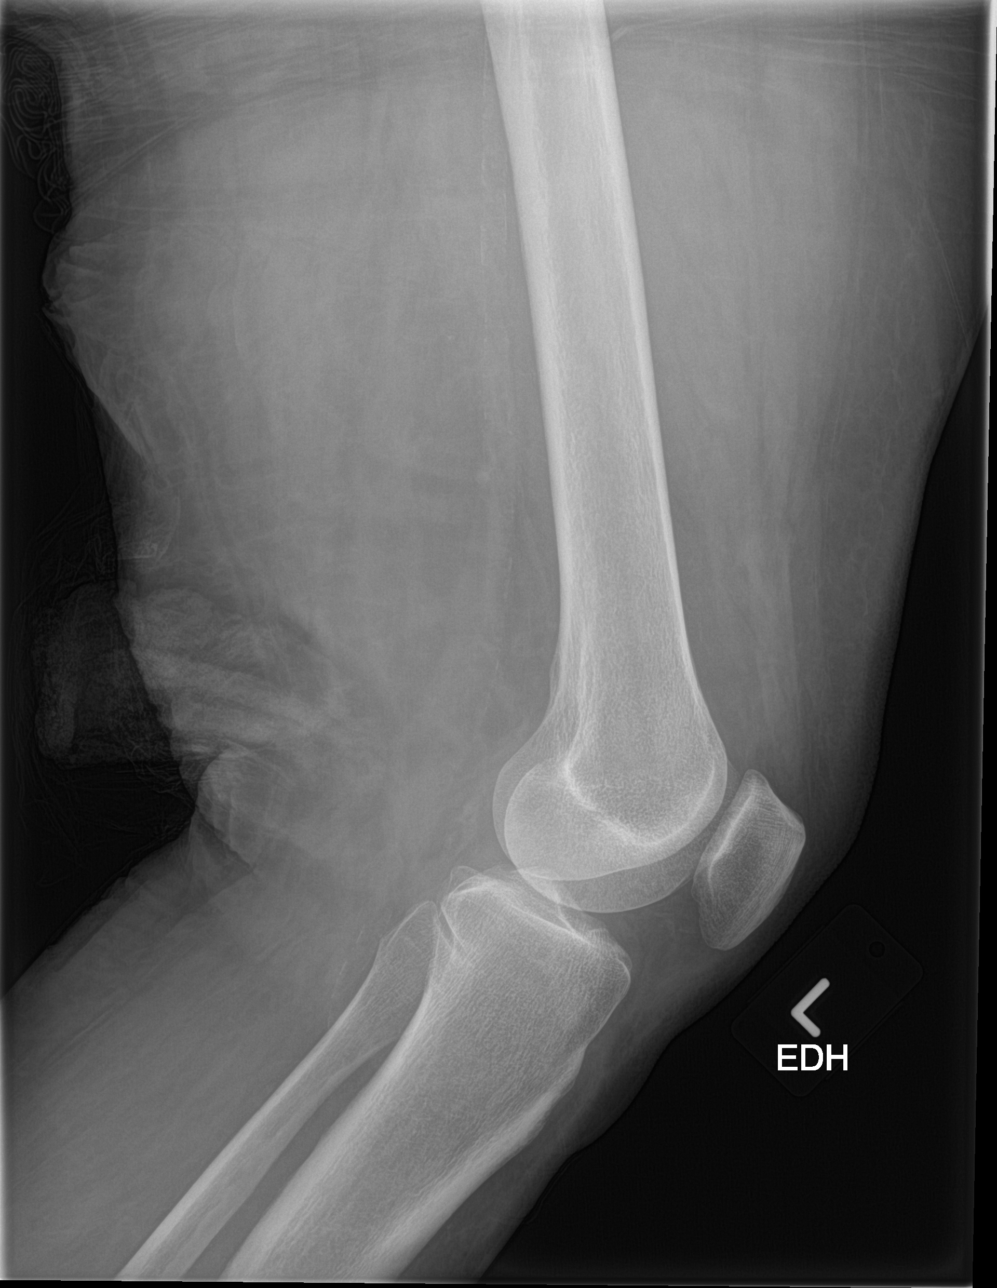

[4 of 4 positions shown; findings below may reference images not displayed]

FINDINGS: No fracture. No bone lesion. No areas of bone resorption are seen to
suggest osteomyelitis.

Joints are normally spaced and aligned.  No arthropathic change.

No joint effusion.

Dressings are seen along the posterior lateral aspect of the knee.
No soft tissue air.
IMPRESSION: 1. No fracture, bone lesion or evidence of osteomyelitis. No knee
joint abnormality.

## 2018-03-14 NOTE — Progress Notes (Signed)
RHANDI, DESPAIN ([id number]) Visit Report for 03/13/2018 Physician Orders Details Patient Name: [name], [name] Date of Service: 03/13/2018 8:00 AM Medical Record Number: [id number] Patient Account Number: [id number] Date of Birth/Sex: [birth year] (57 y.o. Female) Treating RN: Huel Coventry Primary Care Provider: Joen [name] Other Clinician: Referring Provider: Joen [name] Treating Provider/Extender: Linwood Dibbles, HOYT Weeks in Treatment: 38 Verbal / Phone Orders: No Diagnosis Coding Wound Cleansing Wound #3 Right Gluteal fold o Clean wound with Normal Saline. Anesthetic (add to Medication List) Wound #3 Right Gluteal fold o Topical Lidocaine 4% cream applied to wound bed prior to debridement (In Clinic Only). Skin Barriers/Peri-Wound Care Wound #3 Right Gluteal fold o Skin Prep Primary Wound Dressing Wound #3 Right Gluteal fold o Hydrogel o Other: - endoform Secondary Dressing Wound #3 Right Gluteal fold o Drawtex o [place] Dressing Change Frequency Wound #3 Right Gluteal fold o Change dressing every week Follow-up Appointments o Return Appointment in 2 weeks. - MD appts opposites weeks o Nurse Visit as needed - opposite weeks Off-Loading Wound #3 Right Gluteal fold o Turn and reposition every 2 hours Additional Orders / Instructions Wound #3 Right Gluteal fold o Increase protein intake. [name], [name] ([id number]) Electronic Signature(s) Signed: 03/13/2018 7:59:52 AM By: Elliot Gurney, BSN, RN, CWS, Kim RN, BSN Signed: 03/14/2018 4:20:45 AM By: Lenda Kelp PA-C Entered By: Elliot Gurney BSN, RN, CWS, Kim on 03/13/2018 07:59:51 [name], [name] ([id number]) -------------------------------------------------------------------------------- SuperBill Details Patient Name: [name] Date of Service: 03/13/2018 Medical Record Number: [id number] Patient Account Number: [id number] Date of Birth/Sex: 03/16/1961 (57 y.o. Female) Treating RN: Huel Coventry Primary Care  Provider: Joen [name] Other Clinician: Referring Provider: Joen [name] Treating Provider/Extender: Linwood Dibbles, HOYT Weeks in Treatment: 76 Diagnosis Coding ICD-10 Codes Code Description L89.314 Pressure ulcer of right buttock, stage 4 E11.42 Type 2 diabetes mellitus with diabetic polyneuropathy Facility Procedures CPT4 Code: [id number] Description: 94765 - WOUND CARE VISIT-LEV 2 EST PT Modifier: Quantity: 1 Electronic Signature(s) Signed: 03/14/2018 4:20:45 AM By: Lenda Kelp PA-C Entered By: Elliot Gurney, BSN, RN, CWS, Kim on 03/13/2018 11:06:08

## 2018-03-15 ENCOUNTER — Encounter: Payer: Self-pay | Admitting: Physical Therapy

## 2018-03-15 ENCOUNTER — Ambulatory Visit: Payer: BLUE CROSS/BLUE SHIELD | Admitting: Physical Therapy

## 2018-03-15 DIAGNOSIS — R262 Difficulty in walking, not elsewhere classified: Secondary | ICD-10-CM

## 2018-03-15 DIAGNOSIS — M6281 Muscle weakness (generalized): Secondary | ICD-10-CM | POA: Diagnosis not present

## 2018-03-15 DIAGNOSIS — R2689 Other abnormalities of gait and mobility: Secondary | ICD-10-CM

## 2018-03-15 NOTE — Therapy (Signed)
[facility] MAIN [facility] SERVICES [address] [place], [place], [address] Phone: [phone]   Fax:  [phone]  Physical Therapy Treatment  Patient Details  Name: [name] MRN: [id number] Date of Birth: December 04, 1961 Referring Provider: Dr. Karenann Cai   Encounter Date: 03/15/2018  PT End of Session - 03/15/18 0835    Visit Number  2    Number of Visits  8    Date for PT Re-Evaluation  04/25/18    PT Start Time  0836    PT Stop Time  0917    PT Time Calculation (min)  41 min    Equipment Utilized During Treatment  Gait belt bilateral AFO's    Activity Tolerance  Patient tolerated treatment well;Patient limited by fatigue    Behavior During Therapy  [facility] for tasks assessed/performed       Past Medical History:  Diagnosis Date  . Acute pain of right shoulder 05/19/2016  . Acute PN (pyelonephritis) 05/18/2016  . Acute pyelonephritis 05/18/2016  . Anxiety   . Arthritis   . Asthma   . Diabetes mellitus without complication (HCC)   . GERD (gastroesophageal reflux disease)   . Glaucoma   . Hyperlipemia   . Hyperlipemia   . Hypertension     Past Surgical History:  Procedure Laterality Date  . ANTERIOR CRUCIATE LIGAMENT REPAIR      There were no vitals filed for this visit.  Subjective Assessment - 03/15/18 0841    Subjective  Pt denies any new complaints or concerns at this time.  Pt completed her HEP with no questions at this time.  Pt has noticed that her LLE is more weak than her RLE.      Pertinent History  Patient presented in manual wheelchair with bilateral AFOs. April of 2017 patient came home from work reported not feeling good, The next couple days was not feeling good. Went to doctor who called EMS to take her to the hospital. Was unable to get a number on her sugars initially due to patient not taking her medicine due to not feeling well and not eating. Sugar was read to be in the 700's, also found multiple strains of  a UTI/sepsis while there. Was in ICU for 12 days at [facility] then went to [facility]. While at [facility] was not walked or gotten up so after the fourth months there she returned home with in home therapy. Has not had any physical therapy since October. Gets around house with walker ok or in her wheelchair. Has pressure ulcer on Right buttocks with pmh including GERD, DM type II, HTN, and polyneuropathy. Patient wants to get stronger to be able to walk and stand again for more independence and to help her mother.     Limitations  Lifting;Standing;Walking;House hold activities;Other (comment)    How long can you sit comfortably?  hour    How long can you stand comfortably?  stand 5 minutes with walker     How long can you walk comfortably?  10 ft    Patient Stated Goals  walk better, want to drive, stand for longer, help her mom    Currently in Pain?  Yes    Pain Score  6     Pain Location  -- feet due to neuropathy    Pain Orientation  Right;Left    Pain Descriptors / Indicators  Tingling    Pain Type  Chronic pain    Pain Onset  More  than a month ago    Pain Frequency  Constant       TREATMENT   Seated BLE marching x15 with noted mild compensatory posterior trunk lean with raising LLE   Bil Hip Abd/ER with GTB around knees 2x15 with cues to focus on eccentric control   Gluteal squeezes 10x 5 second holds in supine   BLE bridges with cues for glute squeeze and core activation 2x10. Very challenging for the pt.   Step ups and downs to 6" step with BUE support. Cues to use UEs as little as she can for improved BLE strengthening. Bil knee hyperextension noted with step down. X6, limited by fatigue   Mini squats in // bars with BUE support. 2x5, limited by fatigue   Pt requires long rest breaks between sets and between exercises due to quickness to fatigue.                         PT Education - 03/15/18 0834    Education provided  Yes    Education Details  Exercise  technique    Person(s) Educated  Patient    Methods  Explanation;Demonstration;Verbal cues    Comprehension  Verbalized understanding;Returned demonstration;Verbal cues required;Need further instruction       PT Short Term Goals - 02/28/18 1058      PT SHORT TERM GOAL #1   Title  Patient will be independent in home exercise program to improve strength/mobility for better functional independence with ADLs.    Baseline  HEP added    Time  2    Period  Weeks    Status  New    Target Date  03/14/18      PT SHORT TERM GOAL #2   Title   Patient (< 9 years old) will complete five times sit to stand test in < 20 seconds indicating an increased LE strength and improved balance    Baseline  3/6: 46 seconds with UE support     Time  2    Period  Weeks    Status  New    Target Date  03/14/18      PT SHORT TERM GOAL #3   Title  Patient will ambulate 50 ft without rest breaks to increase functional independence.     Baseline  ambulates 30 ft     Time  2    Period  Weeks    Status  New    Target Date  03/14/18      PT SHORT TERM GOAL #4   Title  Patient will increase BLE gross strength to 4-/5 as to improve functional strength for independent gait, increased standing tolerance and increased ADL ability.    Baseline  LLE 3/5 gross     Time  2    Period  Weeks    Status  New    Target Date  03/14/18        PT Long Term Goals - 02/28/18 1105      PT LONG TERM GOAL #1   Title  Patient will increase BLE gross strength to 4+/5 as to improve functional strength for independent gait, increased standing tolerance and increased ADL ability.    Baseline  3/6: LLE gross 3/5    Time  8    Period  Weeks    Status  New    Target Date  04/25/18      PT LONG TERM GOAL #2   Title  Patient will increase lower extremity functional scale to >60/80 to demonstrate improved functional mobility and increased tolerance with ADLs.     Baseline  3/6: 24/80    Time  8    Period  Weeks    Status  New     Target Date  04/25/18      PT LONG TERM GOAL #3   Title  Patient (< 4 years old) will complete five times sit to stand test in < 10 seconds indicating an increased LE strength and improved balance    Baseline  3/6: 46 seconds with UE support     Time  8    Period  Weeks    Status  New    Target Date  04/25/18      PT LONG TERM GOAL #4   Title  Patient will increase 10 meter walk test to >1.59m/s as to improve gait speed for better community ambulation and to reduce fall risk    Baseline  .11 m/s     Time  8    Period  Weeks    Status  New    Target Date  04/25/18      PT LONG TERM GOAL #5   Title  Patient will increase ABC scale score >80% to demonstrate better functional mobility and better confidence with ADLs.     Baseline  3/6: 28.7%    Time  8    Period  Weeks    Status  New    Target Date  04/25/18            Plan - 03/15/18 0843    Clinical Impression Statement  Pt demonstrates weakness greater on LLE compared to RLE which is reflected when pt presents L hip F with compensatory posterior trunk lean.  Progressed Bil glute strengthening this session with cues provided for improved technique for greater glute activation.  Pt fatigues quickly and requires frequent rest breaks between sets and between exercises.  Pt will benefit from continued skilled PT interventions for improved strength and independence.     Rehab Potential  Fair    Clinical Impairments Affecting Rehab Potential  + good motivation, +performing HEP from previous home health -chronicity, body habitus     PT Frequency  1x / week    PT Duration  8 weeks    PT Treatment/Interventions  ADLs/Self Care Home Management;Cryotherapy;Ultrasound;Traction;Moist Heat;Electrical Stimulation;DME Instruction;Gait training;Stair training;Balance training;Therapeutic exercise;Therapeutic activities;Functional mobility training;Neuromuscular re-education;Patient/family education;Orthotic Fit/Training;Manual  techniques;Wheelchair mobility training;Passive range of motion;Energy conservation;Taping    PT Next Visit Plan  ambulatory, LE strengthening.     PT Home Exercise Plan  added gluteal strenght and hamstring curls to HEP    Consulted and Agree with Plan of Care  Patient       Patient will benefit from skilled therapeutic intervention in order to improve the following deficits and impairments:  Abnormal gait, Decreased activity tolerance, Decreased balance, Decreased endurance, Decreased knowledge of precautions, Decreased mobility, Decreased strength, Difficulty walking, Impaired perceived functional ability, Impaired sensation, Postural dysfunction, Improper body mechanics, Pain  Visit Diagnosis: Muscle weakness (generalized)  Other abnormalities of gait and mobility  Difficulty in walking, not elsewhere classified     Problem List Patient Active Problem List   Diagnosis Date Noted  . 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 CKD (chronic kidney disease) stage 3, GFR 30-59 ml/min (HCC) 07/04/2017  . Class 3 drug-induced obesity with serious comorbidity and body mass index (BMI) of 45.0 to 49.9 in adult [facility])  07/04/2017  . Chronic feet pain (Location of Primary Source of Pain) (Bilateral) (R>L) 04/25/2017  . Diabetic peripheral neuropathy (HCC) (Location of Primary Source of Pain) (Bilateral) (R>L) 04/25/2017  . Chronic lower extremity pain (Location of Secondary source of pain) (Bilateral) (R>L) 04/25/2017  . Chronic pain syndrome 04/25/2017  . Long term (current) use of opiate analgesic 04/25/2017  . Long term prescription opiate use 04/25/2017  . Opiate use 04/25/2017  . Chronic hand pain (Location of Tertiary source of pain) (Bilateral) (L>R) 04/25/2017  . Carpal tunnel syndrome (Bilateral) (L>R) 04/25/2017  . History of stroke 04/25/2017  . Infected decubitus ulcer, unstageable (HCC) 10/26/2016  . Closed fracture of humerus, surgical neck 05/31/2016  . Closed 3-part  fracture of surgical neck of right humerus with delayed healing 05/31/2016  . Pain in shoulder 05/19/2016  . Edema leg 05/18/2016  . 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 Kidney lump 05/18/2016  . Bilateral edema of lower extremity 05/18/2016  . Bilateral leg weakness 05/18/2016  . Kidney mass 05/18/2016  . Pressure ulcer 04/24/2016  . History of DKA (diabetic ketoacidosis) (HCC) 04/23/2016    Class: History of  . Diabetes mellitus (HCC) 04/23/2016  . Other specified diabetes mellitus with ketoacidosis without coma (HCC) 04/23/2016    Encarnacion Chu PT, DPT 03/15/2018, 9:20 AM  [facility] MAIN [facility] SERVICES [address] [place], [place], [address] Phone: [phone]   Fax:  [phone]  Name: [name] MRN: [id number] Date of Birth: [birth year]

## 2018-03-18 NOTE — Progress Notes (Signed)
ROSELY, FERNANDEZ ([id number]) Visit Report for 03/13/2018 Arrival Information Details Patient Name: [name], [name] Date of Service: 03/13/2018 8:00 AM Medical Record Number: [id number] Patient Account Number: [id number] Date of Birth/Sex: [birth year] (57 y.o. F) Treating RN: Huel Coventry Primary Care Tymar Polyak: Joen [name] Other Clinician: Referring Travis Purk: Joen [name] Treating Shanvi Moyd/Extender: Linwood Dibbles, HOYT Weeks in Treatment: 4 Visit Information History Since Last Visit Added or deleted any medications: No Patient Arrived: Wheel Chair Any new allergies or adverse reactions: No Arrival Time: 08:03 Had a fall or experienced change in No activities of daily living that may affect Accompanied By: self risk of falls: Transfer Assistance: None Signs or symptoms of abuse/neglect since last visito No Patient Identification Verified: Yes Hospitalized since last visit: No Secondary Verification Process Completed: Yes Has Dressing in Place as Prescribed: Yes Patient Requires Transmission-Based No Pain Present Now: No Precautions: Patient Has Alerts: Yes Patient Alerts: DM II Electronic Signature(s) Signed: 03/16/2018 4:43:23 PM By: Elliot Gurney, BSN, RN, CWS, Kim RN, BSN Entered By: Elliot Gurney, BSN, RN, CWS, Kim on 03/13/2018 08:03:29 Guidice, [name] ([id number]) -------------------------------------------------------------------------------- Clinic Level of Care Assessment Details Patient Name: [name] Date of Service: 03/13/2018 8:00 AM Medical Record Number: [id number] Patient Account Number: [id number] Date of Birth/Sex: 05/23/1961 (57 y.o. F) Treating RN: Huel Coventry Primary Care Yaretsi Humphres: Joen [name] Other Clinician: Referring Estevon Fluke: Joen [name] Treating Reality Dejonge/Extender: Linwood Dibbles, HOYT Weeks in Treatment: 1 Clinic Level of Care Assessment Items TOOL 4 Quantity Score []  - Use when only an EandM is performed on FOLLOW-UP visit 0 ASSESSMENTS - Nursing Assessment /  Reassessment []  - Reassessment of Co-morbidities (includes updates in patient status) 0 X- 1 5 Reassessment of Adherence to Treatment Plan ASSESSMENTS - Wound and Skin Assessment / Reassessment X - Simple Wound Assessment / Reassessment - one wound 1 5 []  - 0 Complex Wound Assessment / Reassessment - multiple wounds []  - 0 Dermatologic / Skin Assessment (not related to wound area) ASSESSMENTS - Focused Assessment []  - Circumferential Edema Measurements - multi extremities 0 []  - 0 Nutritional Assessment / Counseling / Intervention []  - 0 Lower Extremity Assessment (monofilament, tuning fork, pulses) []  - 0 Peripheral Arterial Disease Assessment (using hand held doppler) ASSESSMENTS - Ostomy and/or Continence Assessment and Care []  - Incontinence Assessment and Management 0 []  - 0 Ostomy Care Assessment and Management (repouching, etc.) PROCESS - Coordination of Care X - Simple Patient / Family Education for ongoing care 1 15 []  - 0 Complex (extensive) Patient / Family Education for ongoing care []  - 0 Staff obtains , Records, Test Results / Process Orders []  - 0 Staff telephones HHA, Nursing Homes / Clarify orders / etc []  - 0 Routine Transfer to another Facility (non-emergent condition) []  - 0 Routine Hospital Admission (non-emergent condition) []  - 0 New Admissions / / Ordering NPWT, Apligraf, etc. []  - 0 Emergency Hospital Admission (emergent condition) X- 1 10 Simple Discharge Coordination Martian, Lear L. ( ) []  - 0 Complex (extensive) Discharge Coordination PROCESS - Special Needs []  - Pediatric / Minor Patient Management 0 []  - 0 Isolation Patient Management []  - 0 Hearing / Language / Visual special needs []  - 0 Assessment of Community assistance (transportation, D/C planning, etc.) []  - 0 Additional assistance / Altered mentation []  - 0 Support Surface(s) Assessment (bed, cushion, seat, etc.) INTERVENTIONS - Wound  Cleansing / Measurement X - Simple Wound Cleansing - one wound 1 5 []  - 0 Complex Wound Cleansing - multiple wounds X- 1 5  Wound Imaging (photographs - any number of wounds) []  - 0 Wound Tracing (instead of photographs) X- 1 5 Simple Wound Measurement - one wound []  - 0 Complex Wound Measurement - multiple wounds INTERVENTIONS - Wound Dressings []  - Small Wound Dressing one or multiple wounds 0 X- 1 15 Medium Wound Dressing one or multiple wounds []  - 0 Large Wound Dressing one or multiple wounds []  - 0 Application of Medications - topical []  - 0 Application of Medications - injection INTERVENTIONS - Miscellaneous []  - External ear exam 0 []  - 0 Specimen Collection (cultures, biopsies, blood, body fluids, etc.) []  - 0 Specimen(s) / Culture(s) sent or taken to Lab for analysis []  - 0 Patient Transfer (multiple staff / / Similar devices) []  - 0 Simple Staple / Suture removal (25 or less) []  - 0 Complex Staple / Suture removal (26 or more) []  - 0 Hypo / Hyperglycemic Management (close monitor of Blood Glucose) []  - 0 Ankle / Brachial Index (ABI) - do not check if billed separately []  - 0 Vital Signs Littles, Jilene L. ( ) Has the patient been seen at the hospital within the last three years: Yes Total Score: 65 Level Of Care: New/Established - Level 2 Electronic Signature(s) Signed: 03/16/2018 4:43:23 PM By: , BSN, RN, CWS, Kim RN, BSN Entered By: , BSN, RN, CWS, Kim on 03/13/2018 08:12:16 Wahid, ( ) -------------------------------------------------------------------------------- Encounter Discharge Information Details Patient Name: Date of Service: 03/13/2018 8:00 AM Medical Record Number: Nurse, adult Patient Account Number: Date of Birth/Sex: [birth year] (57 y.o. F) Treating RN: Primary Care Kiaira Pointer: Other Clinician: Referring Denean Pavon: [phone] Treating Akiva Josey/Extender:  03/18/2018, HOYT Weeks in Treatment: 61 Encounter Discharge Information Items Discharge Pain Level: 0 Discharge Condition: Stable Ambulatory Status: Wheelchair Discharge Destination: Home Private Transportation: Auto Accompanied By: self Schedule Follow-up Appointment: Yes Medication Reconciliation completed and Yes provided to Patient/Care Kraven Calk: Clinical Summary of Care: Electronic Signature(s) Signed: 03/16/2018 4:43:23 PM By: 03/15/2018, BSN, RN, CWS, Kim RN, BSN Entered By: [name], BSN, RN, CWS, Kim on 03/13/2018 08:11:37 Cedeno, [name] (03/15/2018) -------------------------------------------------------------------------------- Patient/Caregiver Education Details Patient Name: [id number] Date of Service: 03/13/2018 8:00 AM Medical Record Number: 03/27/1961 Patient Account Number: [id number] Date of Birth/Gender: July 03, 1961 (57 y.o. F) Treating RN: Joen [name] Primary Care Physician: Linwood Dibbles Other Clinician: Referring Physician: 73 Treating Physician/Extender: 03/18/2018 in Treatment: 70 Education Assessment Education Provided To: Caregiver Education Topics Provided Wound/Skin Impairment: Handouts: Caring for Your Ulcer, Other: Frieda Arnall visit next week Methods: Demonstration, Explain/Verbal Responses: State content correctly Electronic Signature(s) Signed: 03/16/2018 4:43:23 PM By: 03/15/2018, BSN, RN, CWS, Kim RN, BSN Entered By: [name], BSN, RN, CWS, Kim on 03/13/2018 08:11:23 Gulyas, [name] (03/15/2018) -------------------------------------------------------------------------------- Wound Assessment Details Patient Name: [id number] Date of Service: 03/13/2018 8:00 AM Medical Record Number: 03/27/1961 Patient Account Number: [id number] Date of Birth/Sex: 1961-10-01 (57 y.o. F) Treating RN: Joen [name] Primary Care Jaylenn Altier: Skeet Simmer Other Clinician: Referring Tyreka Henneke: 73 Treating Indyah Saulnier/Extender: 03/18/2018, HOYT Weeks in  Treatment: 76 Wound Status Wound Number: 3 Primary Pressure Ulcer Etiology: Wound Location: Right Gluteal fold Wound Status: Open Wounding Event: Pressure Injury Comorbid Asthma, Hypertension, Type II Diabetes, Date Acquired: 04/21/2016 History: Neuropathy Weeks Of Treatment: 76 Clustered Wound: No Photos Photo Uploaded By: 03/15/2018, BSN, RN, CWS, Kim on 03/13/2018 11:04:57 Wound Measurements Length: (cm) 0.5 Width: (cm) 0.5 Depth: (cm) 1.2 Area: (cm) 0.196 Volume: (cm) 0.236 % Reduction in  Area: 93.6% % Reduction in Volume: 98.6% Epithelialization: Small (1-33%) Wound Description Classification: Category/Stage IV Wound Margin: Distinct, outline attached Exudate Amount: Medium Exudate Type: Serous Exudate Color: amber Foul Odor After Cleansing: No Slough/Fibrino Yes Wound Bed Granulation Amount: None Present (0%) Exposed Structure Necrotic Amount: Large (67-100%) Fascia Exposed: No Necrotic Quality: Eschar, Adherent Slough Fat Layer (Subcutaneous Tissue) Exposed: Yes Tendon Exposed: No Muscle Exposed: No Joint Exposed: No Bone Exposed: No Periwound Skin Texture Texture Color No Abnormalities Noted: No No Abnormalities Noted: No Callus: No Atrophie Blanche: No Kiang, Bonnita L. ([id number]) Crepitus: No Cyanosis: No Excoriation: No Ecchymosis: No Induration: No Erythema: No Rash: No Hemosiderin Staining: No Scarring: No Mottled: No Pallor: No Moisture Rubor: No No Abnormalities Noted: No Dry / Scaly: No Temperature / Pain Maceration: No Temperature: No Abnormality Tenderness on Palpation: Yes Wound Preparation Ulcer Cleansing: Rinsed/Irrigated with Saline Topical Anesthetic Applied: Other: lidocaine 4%, Assessment Notes unable to visualize wound bed dt size of opening and depth. Treatment Notes Wound #3 (Right Gluteal fold) 1. Cleansed with: Clean wound with Normal Saline 4. Dressing Applied: Other dressing (specify in notes) 5. Secondary  Dressing Applied Bordered Foam Dressing Notes endoform, drawtex Electronic Signature(s) Signed: 03/16/2018 4:43:23 PM By: Elliot Gurney, BSN, RN, CWS, Kim RN, BSN Entered By: Elliot Gurney, BSN, RN, CWS, Kim on 03/13/2018 08:10:10

## 2018-03-20 ENCOUNTER — Ambulatory Visit: Payer: BLUE CROSS/BLUE SHIELD

## 2018-03-22 ENCOUNTER — Encounter: Payer: BLUE CROSS/BLUE SHIELD | Admitting: Nurse Practitioner

## 2018-03-22 DIAGNOSIS — L89314 Pressure ulcer of right buttock, stage 4: Secondary | ICD-10-CM | POA: Diagnosis not present

## 2018-03-24 NOTE — Progress Notes (Signed)
LINET, BRASH ([id number]) Visit Report for 03/22/2018 Chief Complaint Document Details Patient Name: [name], [name] Date of Service: 03/22/2018 8:00 AM Medical Record Number: [id number] Patient Account Number: [id number] Date of Birth/Sex: 10-10-1961 (57 y.o. F) Treating RN: Phillis Haggis Primary Care Provider: Joen [name] Other Clinician: Referring Provider: Joen [name] Treating Provider/Extender: Kathreen Cosier in Treatment: 21 Information Obtained from: Patient Chief Complaint Patient is here for follow-up evaluation of her right ischial pressure ulcer Electronic Signature(s) Signed: 03/22/2018 8:51:59 AM By: Bonnell Public Previous Signature: 03/22/2018 8:51:39 AM Version By: Bonnell Public Entered By: Bonnell Public on 03/22/2018 08:51:59 Vanwagoner, Nicolas Elbert Ewings ([id number]) -------------------------------------------------------------------------------- HPI Details Patient Name: [name] Date of Service: 03/22/2018 8:00 AM Medical Record Number: [id number] Patient Account Number: [id number] Date of Birth/Sex: 1961/12/15 (57 y.o. F) Treating RN: Phillis Haggis Primary Care Provider: Joen [name] Other Clinician: Referring Provider: Joen [name] Treating Provider/Extender: Kathreen Cosier in Treatment: 51 History of Present Illness HPI Description: 57 year old patient who was seen by visiting [name] wound care specialist for a wound on both her buttock and was found to have an unstageable wound on the right buttock for about 2 months. I understand that she had a fall and was laying on the floor for about 48 hours before she was found and taken to the ICU and had a long injury to her gluteal area from pressure and also had broken her right humerus. She has had a right proximal humerus fracture and has been followed up with orthopedics recently. The patient has a past medical history of type 2 diabetes mellitus, paraparesis, acute pyelonephritis, GERD, hypertension, glaucoma,  chronic pain, anxiety neurosis, nicotine dependence, COPD. the patient had some debridement done and was to operative was recommended to use Silvadene dressing and offloading. She is a smoker and occasionally smokes a few cigarettes. the patient requested a second opinion for months and is here to discuss her care. 09/21/16; the patient re-presents from home today for review of 3 different wounds. I note that she was seen in the clinic here in July at which time she had bilateral buttock wounds. It was apparently suggested at that time that she use a wound VAC bridged to both wounds just near the initial tuberosity's bilaterally which she refused. The history was a bit difficult to put together. Apparently this patient became ill at the end of April of this year. She was found sitting on the floor she had apparently been for 2 days and subsequently admitted to hospital from 04/23/16 through 05/02/16 and at that point she was critically ill ultimately having sepsis secondary to UTI, nontraumatic rhabdomyolysis and diabetic ketoacidosis. She had acute renal failure and I think required ICU care including intubation. Patient states her wounds actually started at that point on the bilateral issue tuberosities however in reviewing the discharge summary from 5/8 I see no reference to wounds at that point. It did state that she had left lower extremity cellulitis however. Reviewing Epic I see no relevant x-rays. It would appear that her discharge creatinine was within the normal range and indeed on 9/15 her creatinine has remained normal. [facility] There the wounds on her bilateral Buttocks were dressed. Only just before her discharge from the nursing facility she developed an "knot" which was interpreted as cellulitis on the posterior aspect of her left knee she was given antibiotics. Apparently sometime late in July a this actually opened and  became a wound at home  health care was tending to however she is still having purulent drainage coming from this and by my understanding the wound depth is actually become unmeasurable. I am not really clear about what home health has been placing in any of these wound areas. The patient states that is something with silver and it. She is not been systemically unwell no fever or chills her appetite is good. She is a diabetic poorly controlled however she states that her recent blood sugars at home have been in the low to mid 100s. 09/28/16 On evaluation today patient appears to continue to exhibit the 3 areas of ulceration that were noted previous. She did have an x-ray of the right pelvis which showed evidence of potential soft tissue infection but no obvious osteomyelitis. There was a discussion last office visit concerning the possibility of a wound VAC. Witth that being said the x-ray report suggested that an MRI may be more appropriate to further evaluate the extent. Subsequently in regard to the wound over the popliteal portion of the left lower extremity with tunneling at 12:00 the CT scan that was ordered was denied by insurance as they state the patient has not had x-rays prior to advanced imaging. Patient states that she is frustrated with the situation overall. 10/05/16 in the interval since I last saw this patient last week she has had the x-ray of the knee performed. I did review that x-ray today and fortunately shows no evidence of osteomyelitis or other acute abnormality at this point in time. She continues to have the opening iin the posterior left popliteal space with tracking proximal up the posterior thigh. Nothing seems to have worsened but it also seems to have not improved. The same is true in regard to the right pressure ulcer over the gluteal region which extends toward the ischium. The left gluteal pressure ulcer actually appears to be doing somewhat better my opinion there is  some necrotic slough but overall this appears fairly well. She tells me thatt she has some discomfort especially when home health is helping her with dressing changes as they do not know her. At worse she rates her pain to be a 5 out of 10 right now it's more like a 1 out of 10. Keitt, Nocole L. ([id number]) 10/07/16; still the patient has 3 different wound areas. She has a deep stage IV wound over her right ischial tuberosity. She is due to have an MRI next week. The wound over her left ischial tuberosity is more superficial and underwent debridement today. Finally she has a small open area in her left popliteal fossa the probes on measurably forward superiorly. Still a lot of drainage coming out of this. The CT scan that I ordered 3 weeks ago was questioned by her insurance company wanting a plain x-ray first. As I understand things result of this is nothing has been done in 3 weeks in terms of imaging the thigh and she has an MRI booked of this along with her pelvis for next week line 10/12/16; patient has a deep probing wound over the right ischiall tuberosity, stage III wound over the left visual tuberosity and a draining sinus in her left popliteal fossa. None of this much different from when I saw this 3 weeks ago. We have been using silver rope to the right ischial wound and a draining area in the left popliteal fossa. Plain silver alginate to the area on the left ischial tuberosity 10/19/16; the patient's wounds are essentially unchanged although the  area on the left lower gluteal is actually improved. Our intake nurse noted drainage from the right initial tuberosity probing wound as well as the draining area in the left popliteal fossa. Both of these were cultured. She had x-rays I think at the insistence of her insurance company on 09/23/16 x-ray of the pelvis was not particularly helpful she did have soft tissue air over the right lower pelvis although with the depth of this wound this is  not surprising. An x-ray of her left knee did not show any specific abnormalities. We are still using silver alginate to these wound areas. Her MRI is booked for 10/27 10/26/16; cultures of the purulent drainage in her right initial tuberosity wound grew moderate Proteus and few staph aureus. The same organisms were cultured out of the left knee sinus tract posteriorly. The staph aureus is MRSA. I had started her on Augmentin last week I added doxycycline. The MRI of the left lower extremity and pelvis was finally done. The MRI of the femur showed subcutaneous soft tissue swelling edema fluid and myositis in the vastus lateralis muscle but no soft tissue abscess septic arthritis or osteomyelitis. MRI of the pelvis showed the left wound to be more expensive extending down to the bone there was osteomyelitis. Left hamstring tendons were also involved. No septic arthritis involving the hip. The decubitus ulcer on the right side showed no definite osteomyelitis or abscess.. The right hip wound is actually the one the probes 6 cm downward. But the MRI showing infection including osteomyelitis on the left explains the draining sinus in the popliteal fossa on the left. She did have antibiotics in the hospitalization last time and this extended into her nursing home stay but I'm not exactly sure what antibiotics and for what duration. According the patient this did include vancomycin with considerable effort of our staff we are able to get the patient into see Dr. Sampson Goon today. There were transportation difficulties. Her mother had open heart surgery and is in the ICU in [place] therefore her brother was unable to transport. Dr. Jarrett Ables office graciously arranged time to see her today. From my point of view she is going to require IV vancomycin plus perhaps a third generation cephalosporin. I plan to keep her on doxycycline and Augmentin until the IV antibiotics can be arranged. 11/02/2016 -  Shekina presents today for management of ulcers; She saw Dr. 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 She is to have the PICC line placed today and receive the initial dose for both antibiotics today. She has yet to receive the offloading chair cushion and/or mattress overlay from home health, apparently this has been a 3 week process. I encouraged her to speak to home health regarding this matter, along with offering home health to contact the wouns care center with any questions or concerns. The left ischial pressure ulcer continues to imporve, while to right ischial ulcer has increased in depth. The popliteal fossa sinus tract remains unmeasurable due to the limitation of depth measurement (tract extends beyond our measuring devices). 11-16-2016 Ms. Stiehl presents today for evaluation and management of bilateral ischial stage IV pressure ulcers and sinus tract to the left popliteal fossa. she is under the care of Dr. Sampson Goon for IV antibiotic therapy; she states that the vancomycin was placed on hold and will be restarted at a lower dose based on her renal function. She continues taking Zosyn in addition to  the vancomycin. She also states that she has yet to receive offloading cushions from home health, according to her she does not qualify for these offloading cushions because "the ulcers are unstageable ". We will contact the home health agency today to lend clarity regarding her pressure ulcers. The left popliteal fossa sinus tract continues to be a measurable as it extends beyond the length of our measuring devices.. 11/30/16; the patient is still on vancomycin and Zosyn. The depth of the draining sinus behind her left popliteal fossa is down to 4 cm although there is still serosanguineous drainage coming out of this. She saw Dr. Sampson Goon of infectious disease yesterday the idea is to weeks more of  IV antibiotics and then oral antibiotics although I have not read his note. The area on the left gluteal fold is just about healed. She has a 6 cm draining sinus over the right initial tuberosity although I cannot feel bone at the base of this. As far as the patient is aware she has not had a recheck of her inflammatory markers. 12/07/16; patient is on vancomycin and Zosyn appointment with Dr. Sampson Goon on the 19th at which point the patient expects to have a change in antibiotics. Remarkable improvement over the wound over the left ischial tuberosity which is just about closed. The draining sinus in her popliteal fossa has 0.4 cm in depth. The area on the right ischial tuberosity still probes down 7 cm. This is closed and overall wound dimensions but not depth. 12/14/16; patient is completing her vancomycin and Zosyn and per her she is going to transition to Bactrim and Augmentin for another 3 weeks. The area on her left gluteal fold is closed except for some skin tears. The area behind her left knee is no longer has any depth. The only remaining area that is of clinical concern is on the right gluteal fold probing towards the right Gregory, Julie-Ann L. ([id number]) ischial tuberosity. Today this measures 6.9 cm in depth. Very gritty surface 12/21/16; patient is now on Bactrim and Augmentin as directed by infectious disease. This should be for another 2 weeks. The area in her left gluteal fold and left popliteal are closed over and fully healed. Measurements today at 7 cm in the right buttock wound is unchanged from last week. 12/27/16; patient is on Bactrim and Augmentin for another week as directed by infectious disease. She has completed her IV antibiotics. She is not been systemically unwell no fever no chills. The area on her right buttock measured over 6 cm in depth. There is no palpable bone. No evidence of surrounding soft tissue infection. She is complaining of tongue irritation and has a history  of thrush 01/04/17; patient is been back to see Dr. Sampson Goon, her Augmentin was stopped but he continued the Bactrim for another 3 weeks. Depth of the wound is 6.7 cm there is been no major change in either direction. She is not receive the wound VAC from home health I think because of confusion about who is supposed to provided will actually talk to the home health agency today [[facility] The net no major change. 01/18/17; patient obtained her wound VAC about 10 days ago however for some reason it was not actually put on the wound. She is therefore here for [place] to apply this I guess. No other issues are noted. She is not complaining of pain fever drainage 02/01/17; patient is here now having the wound VAC or 3-4 weeks to a deep pressure area over  the right initial tuberosity. This measures 6 cm in depth today which is about half a centimeter better than 2 weeks ago. There is no evidence she is systemically unwell no fever no chills no pain around the area. 02/15/17; I follow this patient every 2 weeks for a deep area over the right ischial tuberosity. This measures 5.5 cm today which is a continued improvement of 1.2 cm from 1/10 and down 0.5 cm from her visit 2 weeks ago 03/01/17; continued difficult area over the right initial tuberosity using the wound VAC. Depth today of 5.1 cm which is improved. Does not appear to be a lot of drainage in the canister. Antibiotics were finally stopped by Dr. Sampson Goon bactrim[]  and inflammatory markers have been repeated 03/15/17; fall this lady every 2 weeks for a difficult area over her lower right gluteal area/ischial tuberosity. Depth today at 4.6 cm. This is a slow but steady improvement in the depth of this wound. Although we have labeled this as a pressure ulcer there may have been an underlying infection here at one point before we saw her. She had osteomyelitis on the left extensively which is since resolved 03/29/17- patient is here for follow-up evaluation  of her right ischial pressure ulcer. She continues with the wound VAC and home health. According to the nurse home health has been using less foams and appropriate and we will instruct accordingly. The patient continues to smoke, approximately 10 cigarettes a day. She has been advised to decrease that in half by her next appointment, with a goal of complete cessation. She states her blood sugars have been consistently less than 150. She states that she spends most of her day position left lateral or prone. She does have an air mattress on her bed, she does not have an mattress for her chair, she states she cannot afford this. 04/12/17; patient is here for evaluation of her right ischial pressure ulcer. We continue to use a wound VAC with minimal improvement today the depth of this measuring 4.4 cm versus 4.6 cm 2 weeks ago. We are using a [employer] wound VAC on this area. There is not excessive drainage no pain. The patient tells me she tries to keep off this in bed but is up in the wheelchair for 2 hours a day. She is limited in no her overall ambulation but is improving and apparently is getting bilateral lower extremity braces which she hopes will improve her ability to walk independently. 05/10/17; Depth at 3.3cm. Improved 05/24/17; depth at 3.5 cm. This is not improved since last time. Not clear if they are using collagen under the foam 06/07/17; depth that 2.9 which is a slight improvement. Still using the wound VAC. 06/21/17; depth is 2.9 cm which is exactly the same as last time. Also the appearance of this wound is completely the same. We have been using silver collagen under a wound VAC. 07/05/17 patient presents today for reevaluation concerning her right Ischial wound. She had switched insurance companies and so it does appear that the Wound VAC needs to be reauthorized which we are working on this morning. Nonetheless her wound has been saying about the same we have continued to use the silver  collagen underneath the wound VAC. She has no discomfort. 07/19/17; patient follows every 2 weeks for her right if she'll wound. Since I last saw this a month ago her dimensions of come down to 2.6 cm. This is slightly down from a month ago when it was 2.9 cm  in 4 months ago it was 4.6 cm. I note that there were insurance company issues with regard to the wound VAC which she apparently is not having on for several weeks now. I think it would be reasonable to change therapies here. Will use silver alginate rope 08/02/17 on evaluation today patient's wound actually appears to be doing significantly better in regard to the depth as compared to her previous evaluation. She is having no discomfort. Unfortunately she never got the Wound VAC reapproved following the insurance issues from several weeks ago. With that being said it does not appear that she needs to requires the Wound VAC anymore and in fact I feel that she is doing better and making greater improvements at this point without it. Fortunately she has no nausea, vomiting, diarrhea, fevers, or chills. 08/16/17; patient's wound depth down to 2.3 cm. She is using silver alginate packing rope. Note that her wound VAC was discontinued due to insurance issues. This is not particularly surprising. She has not been systemically unwell and has no other new complaints 08/30/17; no change in depth. Still at 2.3 cm. Still with the same gritty surface requiring debridement. I've been using silver Barga, Diamantina L. ([id number]) alginate for quite a period of time although this came down nicely in the last month 10/04/17; the depth of this is 2 cm however with careful inspection under high-intensity light most of the walls of this small probing sinus seemed to be normally epithelialized. I cannot exactly see the base of this however there is been no drainage. We've been using silver alginate there is no drainage on the dressing when it is removed. I'm therefore  thinking that this reminiscent sinus is probably fully epithelialized. There is no evidence of surrounding infection or pain. The patient had many review weakness in her bilateral legs. She is already been to see a neurologist who according the patient told her "you would never walk again" 11/01/17; this is a patient I last saw a month ago. At that point the linear tunnel in her right buttock was 2 cm. It was not possible to see the depth of this as skin had grown into the tunnel. In light of the fact that there was no drainage and no visible wound I recommended that we just allow her to go about her usual activity without dressing. She reports that almost immediately she noted drainage on her clothing although in spite of her instructions the contrary she did not come back to the clinic. She has not been specifically addressing this and is continued to no drainage without other symptoms either local or systemic 11/29/17; I follow this woman monthly. Last time the wound on her right buttock was 1.8 cm today measuring at 1.4 there has been gradual improvement in this. Concerning is the fact that the patient says that Dr. Sampson Goon of infectious disease changed her from doxycycline to Augmentin apparently because of the elevated inflammatory markers. She also said he did a culture of this area but she is not heard the results. I don't think I have his information available on care everywhere however I will check this. She is using Hydrofera Blue rope. The patient and her intake nurse report that she is still having identifiable drainage which certainly makes it clear that this is not closed. 01/02/17 on evaluation today patient appears to be doing fairly well in regard to her left gluteal wound. She has been continuing with the [facility] Dressing's here in our office. We actually  see her once a month and then subsequently are performing weekly dressing changes with Hydrofera Blue Dressing rope  during nurse visits one time a week. Today there really was not much drainage at this point. However this has happened previously where she had no drainage and was essentially thought to be close internally although the external had not completely pulled together. However then she reopened and began draining again. He has been mentioned the possibility of her seeing plastic surgery to try to get this finally and completely healed. 01/18/18 she is here in follow-up evaluation for right ischial pressure ulcer. She has an appointment with [facility] plastic surgery on 2/8 for evaluation. She continues to have minimal drainage at the tip of the dressing product. She continues to smoke, admits to 8-10 cigarettes per day. No change in treatment plan 02/06/18 on evaluation today patient appears to be doing about the same in regard to her right Ischial wound. She has been tolerating the dressing changes without complication. She did have her appointment at [facility] plastic surgery she was not really impressed. They stated that due to her weight they were unable to perform a flap and subsequently recommended the only thing they could attempt would be to exercise around the ulcer and then attempt to suture it together. The question is whether this would be effective or not. Patient really is not wanting to proceed down that road. Nonetheless she really have not changed much in regard to her wound measurements. 03/06/18 on evaluation today patient states that last week when I perform the silver nitrate that she actually had some burning pain for about two days following. Since that time the area has been very dry and she states that it is also been very dark and scabbed around the area. With that being said other than this she feels that the hope was the fact that it was burning was a good sign and that they would be improvement but the really does not appear to be significant improvement at this point. We have tried a  lot of different dressings for her most recently Hydrofera Blue Dressing, silver alginate rope, and collagen. We have never attempted Endoform which I think could be a possibility for her. 03/22/18-She is here for a follow-up evaluation of her right ischial pressure ulcer. There is no drainage on her dressings and what appears to be epidermal debris in the ulcer; she has no pain. The ulcer was cleanse with betadine, I believe it is healed, despite remaining a  1" depth. We will place a dry dressing over and evaluate next week for drainage, if no drainage she will be discharged at that time. Electronic Signature(s) Signed: 03/22/2018 8:54:48 AM By: Bonnell Public Entered By: Bonnell Public on 03/22/2018 08:54:48 July, [name] ([id number]) -------------------------------------------------------------------------------- Physician Orders Details Patient Name: [name] Date of Service: 03/22/2018 8:00 AM Medical Record Number: [id number] Patient Account Number: [id number] Date of Birth/Sex: [birth year] (56 y.o. F) Treating RN: Phillis Haggis Primary Care Provider: Joen [name] Other Clinician: Referring Provider: Joen [name] Treating Provider/Extender: Kathreen Cosier in Treatment: 32 Verbal / Phone Orders: Yes Clinician: Ashok Cordia, Debi Read Back and Verified: Yes Diagnosis Coding Wound Cleansing Wound #3 Right Gluteal fold o Clean wound with Normal Saline. Anesthetic (add to Medication List) Wound #3 Right Gluteal fold o Topical Lidocaine 4% cream applied to wound bed prior to debridement (In Clinic Only). Skin Barriers/Peri-Wound Care Wound #3 Right Gluteal fold o Skin Prep Secondary Dressing Wound #3 Right Gluteal fold   o [place] Dressing Change Frequency Wound #3 Right Gluteal fold o Change dressing every week Follow-up Appointments o Return Appointment in 2 weeks. - MD appts opposites weeks o Nurse Visit as needed - opposite weeks Off-Loading Wound  #3 Right Gluteal fold o Turn and reposition every 2 hours Additional Orders / Instructions Wound #3 Right Gluteal fold o Increase protein intake. Patient Medications Allergies: Biaxin Notifications Medication Indication Start End lidocaine DOSE 1 - topical 4 % cream - 1 cream topical Boal, Temeca L. ([id number]) Electronic Signature(s) Signed: 03/22/2018 4:15:03 PM By: Alejandro Mulling Signed: 03/22/2018 9:02:48 PM By: Bonnell Public Entered By: Alejandro Mulling on 03/22/2018 08:31:11 Hockey, [name] ([id number]) -------------------------------------------------------------------------------- Prescription 03/22/2018 Patient Name: [name] Provider: Bonnell Public NP Date of Birth: 03/23/1961 NPI#: [id number] Sex: F DEA#: [id number] Phone #: [phone] License #: Patient Address: [facility] [address] [facility] [place], [place] [address] [address] [place], [place] [address] [phone] Allergies Biaxin Medication Medication: Route: Strength: Form: lidocaine topical 4% cream Class: TOPICAL LOCAL ANESTHETICS Dose: Frequency / Time: Indication: 1 1 cream topical Number of Refills: Number of Units: 0 Generic Substitution: Start Date: End Date: Administered at Substitution Permitted Facility: Yes Time Administered: Time Discontinued: Note to Pharmacy: Signature(s): Date(s): Electronic Signature(s) Signed: 03/22/2018 4:15:03 PM By: Alejandro Mulling Signed: 03/22/2018 9:02:48 PM By: Bonnell Public Entered By: Alejandro Mulling on 03/22/2018 08:31:11 Hosking, Calley L. ([id number]) Ewalt, Ashlee Elbert Ewings ([id number]) --------------------------------------------------------------------------------  Problem List Details Patient Name: [name] Date of Service: 03/22/2018 8:00 AM Medical Record Number: [id number] Patient Account Number: [id number] Date of Birth/Sex: September 30, 1961 (56 y.o. 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 09/21/2016 09/21/2016 Resolved Problems ICD-10 Code Description Active Date Resolved Date L89.323 Pressure ulcer of left buttock, stage 3 09/21/2016 09/21/2016 L89.324 Pressure ulcer of left buttock, stage 4 11/16/2016 11/16/2016 Electronic Signature(s) Signed: 03/22/2018 8:40:37 AM By: Bonnell Public Entered By: Bonnell Public on 03/22/2018 08:40:37 Montejano, [name] ([id number]) -------------------------------------------------------------------------------- Progress Note Details Patient Name: [name] Date of Service: 03/22/2018 8:00 AM Medical Record Number: [id number] Patient Account Number: [id number] Date of Birth/Sex: 03-Mar-1961 (57 y.o. F) Treating RN: Phillis Haggis Primary Care Provider: Joen [name] Other Clinician: Referring Provider: Joen [name] Treating Provider/Extender: Kathreen Cosier in Treatment: 85 Subjective Chief Complaint Information obtained from Patient Patient is here for follow-up evaluation of her right ischial pressure ulcer History of Present Illness (HPI) 57 year old patient who was seen by visiting [name] wound care specialist for a wound on both her buttock and was found to have an unstageable wound on the right buttock for about 2 months. I understand that she had a fall and was laying on the floor for about 48 hours before she was found  and taken to the ICU and had a long injury to her gluteal area from pressure and also had broken her right humerus. She has had a right proximal humerus fracture and has been followed up with orthopedics recently. The patient has a past medical history of type  2 diabetes mellitus, paraparesis, acute pyelonephritis, GERD, hypertension, glaucoma, chronic pain, anxiety neurosis, nicotine dependence, COPD. the patient had some debridement done and was to operative was recommended to use Silvadene dressing and offloading. She is a smoker and occasionally smokes a few cigarettes. the patient requested a second opinion for months and is here to discuss her care. 09/21/16; the patient re-presents from home today for review of 3 different wounds. I note that she was seen in the clinic here in July at which time she had bilateral buttock wounds. It was apparently suggested at that time that she use a wound VAC bridged to both wounds just near the initial tuberosity's bilaterally which she refused. The history was a bit difficult to put together. Apparently this patient became ill at the end of April of this year. She was found sitting on the floor she had apparently been for 2 days and subsequently admitted to hospital from 04/23/16 through 05/02/16 and at that point she was critically ill ultimately having sepsis secondary to UTI, nontraumatic rhabdomyolysis and diabetic ketoacidosis. She had acute renal failure and I think required ICU care including intubation. Patient states her wounds actually started at that point on the bilateral issue tuberosities however in reviewing the discharge summary from 5/8 I see no reference to wounds at that point. It did state that she had left lower extremity cellulitis however. Reviewing Epic I see no relevant x-rays. It would appear that her discharge creatinine was within the normal range and indeed on 9/15 her creatinine has remained normal. [facility] There the wounds on her bilateral Buttocks were dressed. Only just before her discharge from the nursing facility she developed an "knot" which was interpreted as cellulitis on the posterior aspect of her left knee she was given antibiotics. Apparently sometime late in July a this actually opened and became a wound at home health care was tending to however she is still having purulent drainage coming from this and by my understanding the wound depth is actually become unmeasurable. I am not really clear about what home health has been placing in any of these wound areas. The patient states that is something with silver and it. She is not been systemically unwell no fever or chills her appetite is good. She is a diabetic poorly controlled however she states that her recent blood sugars at home have been in the low to mid 100s. 09/28/16 On evaluation today patient appears to continue to exhibit the 3 areas of ulceration that were noted previous. She did have an x-ray of the right pelvis which showed evidence of potential soft tissue infection but no obvious osteomyelitis. There was a discussion last office visit concerning the possibility of a wound VAC. Witth that being said the x-ray report suggested that an MRI may be more appropriate to further evaluate the extent. Subsequently in regard to the wound over the popliteal portion of the left lower extremity with tunneling at 12:00 the CT scan that was ordered was denied by insurance as they state the patient has not had x-rays prior to advanced imaging. Patient states that she is frustrated with the situation overall. 10/05/16 in the interval since I last saw this patient last week she has had the x-ray of the knee performed. I did review that x-ray today and fortunately shows no evidence of osteomyelitis or other acute abnormality at this point in time. She continues to have the opening iin the posterior  left popliteal space with tracking proximal up the posterior thigh. Nothing seems to have [place], Cree L. ([id number]) worsened but it also seems to have not improved. The same is true in regard to the right pressure ulcer over the gluteal region which extends toward the ischium. The left gluteal pressure ulcer actually appears to be doing somewhat better my opinion there is some necrotic slough but overall this appears fairly well. She tells me thatt she has some discomfort especially when home health is helping her with dressing changes as they do not know her. At worse she rates her pain to be a 5 out of 10 right now it's more like a 1 out of 10. 10/07/16; still the patient has 3 different wound areas. She has a deep stage IV wound over her right ischial tuberosity. She is due to have an MRI next week. The wound over her left ischial tuberosity is more superficial and underwent debridement today. Finally she has a small open area in her left popliteal fossa the probes on measurably forward superiorly. Still a lot of drainage coming out of this. The CT scan that I ordered 3 weeks ago was questioned by her insurance company wanting a plain x-ray first. As I understand things result of this is nothing has been done in 3 weeks in terms of imaging the thigh and she has an MRI booked of this along with her pelvis for next week line 10/12/16; patient has a deep probing wound over the right ischiall tuberosity, stage III wound over the left visual tuberosity and a draining sinus in her left popliteal fossa. None of this much different from when I saw this 3 weeks ago. We have been using silver rope to the right ischial wound and a draining area in the left popliteal fossa. Plain silver alginate to the area on the left ischial tuberosity 10/19/16; the patient's wounds are essentially unchanged although the area on the left lower gluteal is actually improved. Our intake nurse noted drainage from the right  initial tuberosity probing wound as well as the draining area in the left popliteal fossa. Both of these were cultured. She had x-rays I think at the insistence of her insurance company on 09/23/16 x-ray of the pelvis was not particularly helpful she did have soft tissue air over the right lower pelvis although with the depth of this wound this is not surprising. An x-ray of her left knee did not show any specific abnormalities. We are still using silver alginate to these wound areas. Her MRI is booked for 10/27 10/26/16; cultures of the purulent drainage in her right initial tuberosity wound grew moderate Proteus and few staph aureus. The same organisms were cultured out of the left knee sinus tract posteriorly. The staph aureus is MRSA. I had started her on Augmentin last week I added doxycycline. The MRI of the left lower extremity and pelvis was finally done. The MRI of the femur showed subcutaneous soft tissue swelling edema fluid and myositis in the vastus lateralis muscle but no soft tissue abscess septic arthritis or osteomyelitis. MRI of the pelvis showed the left wound to be more expensive extending down to the bone there was osteomyelitis. Left hamstring tendons were also involved. No septic arthritis involving the hip. The decubitus ulcer on the right side showed no definite osteomyelitis or abscess.. The right hip wound is actually the one the probes 6 cm downward. But the MRI showing infection including osteomyelitis on the left explains the draining sinus in  the popliteal fossa on the left. She did have antibiotics in the hospitalization last time and this extended into her nursing home stay but I'm not exactly sure what antibiotics and for what duration. According the patient this did include vancomycin with considerable effort of our staff we are able to get the patient into see Dr. Sampson Goon today. There were transportation difficulties. Her mother had open heart surgery and is in  the ICU in [place] therefore her brother was unable to transport. Dr. Jarrett Ables office graciously arranged time to see her today. From my point of view she is going to require IV vancomycin plus perhaps a third generation cephalosporin. I plan to keep her on doxycycline and Augmentin until the IV antibiotics can be arranged. 11/02/2016 - Ritu presents today for management of ulcers; She saw Dr. 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 She is to have the PICC line placed today and receive the initial dose for both antibiotics today. She has yet to receive the offloading chair cushion and/or mattress overlay from home health, apparently this has been a 3 week process. I encouraged her to speak to home health regarding this matter, along with offering home health to contact the wouns care center with any questions or concerns. The left ischial pressure ulcer continues to imporve, while to right ischial ulcer has increased in depth. The popliteal fossa sinus tract remains unmeasurable due to the limitation of depth measurement (tract extends beyond our measuring devices). 11-16-2016 Ms. Franca presents today for evaluation and management of bilateral ischial stage IV pressure ulcers and sinus tract to the left popliteal fossa. she is under the care of Dr. Sampson Goon for IV antibiotic therapy; she states that the vancomycin was placed on hold and will be restarted at a lower dose based on her renal function. She continues taking Zosyn in addition to the vancomycin. She also states that she has yet to receive offloading cushions from home health, according to her she does not qualify for these offloading cushions because "the ulcers are unstageable ". We will contact the home health agency today to lend clarity regarding her pressure ulcers. The left popliteal fossa sinus tract continues to be a measurable  as it extends beyond the length of our measuring devices.. 11/30/16; the patient is still on vancomycin and Zosyn. The depth of the draining sinus behind her left popliteal fossa is down to 4 cm although there is still serosanguineous drainage coming out of this. She saw Dr. Sampson Goon of infectious disease yesterday the idea is to weeks more of IV antibiotics and then oral antibiotics although I have not read his note. The area on the left gluteal fold is just about healed. She has a 6 cm draining sinus over the right initial tuberosity although I cannot feel bone at the base of this. As far as the patient is aware she has not had a recheck of her inflammatory markers. 12/07/16; patient is on vancomycin and Zosyn appointment with Dr. Sampson Goon on the 19th at which point the patient expects Budreau, Shriley L. ([id number]) to have a change in antibiotics. Remarkable improvement over the wound over the left ischial tuberosity which is just about closed. The draining sinus in her popliteal fossa has 0.4 cm in depth. The area on the right ischial tuberosity still probes down 7 cm. This is closed and overall wound dimensions but not depth. 12/14/16; patient is completing her vancomycin and Zosyn and  per her she is going to transition to Bactrim and Augmentin for another 3 weeks. The area on her left gluteal fold is closed except for some skin tears. The area behind her left knee is no longer has any depth. The only remaining area that is of clinical concern is on the right gluteal fold probing towards the right ischial tuberosity. Today this measures 6.9 cm in depth. Very gritty surface 12/21/16; patient is now on Bactrim and Augmentin as directed by infectious disease. This should be for another 2 weeks. The area in her left gluteal fold and left popliteal are closed over and fully healed. Measurements today at 7 cm in the right buttock wound is unchanged from last week. 12/27/16; patient is on Bactrim and  Augmentin for another week as directed by infectious disease. She has completed her IV antibiotics. She is not been systemically unwell no fever no chills. The area on her right buttock measured over 6 cm in depth. There is no palpable bone. No evidence of surrounding soft tissue infection. She is complaining of tongue irritation and has a history of thrush 01/04/17; patient is been back to see Dr. Sampson Goon, her Augmentin was stopped but he continued the Bactrim for another 3 weeks. Depth of the wound is 6.7 cm there is been no major change in either direction. She is not receive the wound VAC from home health I think because of confusion about who is supposed to provided will actually talk to the home health agency today [[facility] The net no major change. 01/18/17; patient obtained her wound VAC about 10 days ago however for some reason it was not actually put on the wound. She is therefore here for [place] to apply this I guess. No other issues are noted. She is not complaining of pain fever drainage 02/01/17; patient is here now having the wound VAC or 3-4 weeks to a deep pressure area over the right initial tuberosity. This measures 6 cm in depth today which is about half a centimeter better than 2 weeks ago. There is no evidence she is systemically unwell no fever no chills no pain around the area. 02/15/17; I follow this patient every 2 weeks for a deep area over the right ischial tuberosity. This measures 5.5 cm today which is a continued improvement of 1.2 cm from 1/10 and down 0.5 cm from her visit 2 weeks ago 03/01/17; continued difficult area over the right initial tuberosity using the wound VAC. Depth today of 5.1 cm which is improved. Does not appear to be a lot of drainage in the canister. Antibiotics were finally stopped by Dr. Sampson Goon bactrim[]  and inflammatory markers have been repeated 03/15/17; fall this lady every 2 weeks for a difficult area over her lower right gluteal area/ischial  tuberosity. Depth today at 4.6 cm. This is a slow but steady improvement in the depth of this wound. Although we have labeled this as a pressure ulcer there may have been an underlying infection here at one point before we saw her. She had osteomyelitis on the left extensively which is since resolved 03/29/17- patient is here for follow-up evaluation of her right ischial pressure ulcer. She continues with the wound VAC and home health. According to the nurse home health has been using less foams and appropriate and we will instruct accordingly. The patient continues to smoke, approximately 10 cigarettes a day. She has been advised to decrease that in half by her next appointment, with a goal of complete cessation. She states  her blood sugars have been consistently less than 150. She states that she spends most of her day position left lateral or prone. She does have an air mattress on her bed, she does not have an mattress for her chair, she states she cannot afford this. 04/12/17; patient is here for evaluation of her right ischial pressure ulcer. We continue to use a wound VAC with minimal improvement today the depth of this measuring 4.4 cm versus 4.6 cm 2 weeks ago. We are using a [employer] wound VAC on this area. There is not excessive drainage no pain. The patient tells me she tries to keep off this in bed but is up in the wheelchair for 2 hours a day. She is limited in no her overall ambulation but is improving and apparently is getting bilateral lower extremity braces which she hopes will improve her ability to walk independently. 05/10/17; Depth at 3.3cm. Improved 05/24/17; depth at 3.5 cm. This is not improved since last time. Not clear if they are using collagen under the foam 06/07/17; depth that 2.9 which is a slight improvement. Still using the wound VAC. 06/21/17; depth is 2.9 cm which is exactly the same as last time. Also the appearance of this wound is completely the same. We have been using  silver collagen under a wound VAC. 07/05/17 patient presents today for reevaluation concerning her right Ischial wound. She had switched insurance companies and so it does appear that the Wound VAC needs to be reauthorized which we are working on this morning. Nonetheless her wound has been saying about the same we have continued to use the silver collagen underneath the wound VAC. She has no discomfort. 07/19/17; patient follows every 2 weeks for her right if she'll wound. Since I last saw this a month ago her dimensions of come down to 2.6 cm. This is slightly down from a month ago when it was 2.9 cm in 4 months ago it was 4.6 cm. I note that there were insurance company issues with regard to the wound VAC which she apparently is not having on for several weeks now. I think it would be reasonable to change therapies here. Will use silver alginate rope 08/02/17 on evaluation today patient's wound actually appears to be doing significantly better in regard to the depth as compared to her previous evaluation. She is having no discomfort. Unfortunately she never got the Wound VAC reapproved following the insurance issues from several weeks ago. With that being said it does not appear that she needs to requires the Bremer, Valeda L. ([id number]) Wound VAC anymore and in fact I feel that she is doing better and making greater improvements at this point without it. Fortunately she has no nausea, vomiting, diarrhea, fevers, or chills. 08/16/17; patient's wound depth down to 2.3 cm. She is using silver alginate packing rope. Note that her wound VAC was discontinued due to insurance issues. This is not particularly surprising. She has not been systemically unwell and has no other new complaints 08/30/17; no change in depth. Still at 2.3 cm. Still with the same gritty surface requiring debridement. I've been using silver alginate for quite a period of time although this came down nicely in the last month 10/04/17;  the depth of this is 2 cm however with careful inspection under high-intensity light most of the walls of this small probing sinus seemed to be normally epithelialized. I cannot exactly see the base of this however there is been no drainage. We've been using silver  alginate there is no drainage on the dressing when it is removed. 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 There is no evidence of surrounding infection or pain. The patient had many review weakness in her bilateral legs. She is already been to see a neurologist who according the patient told her "you would never walk again" 11/01/17; this is a patient I last saw a month ago. At that point the linear tunnel in her right buttock was 2 cm. It was not possible to see the depth of this as skin had grown into the tunnel. In light of the fact that there was no drainage and no visible wound I recommended that we just allow her to go about her usual activity without dressing. She reports that almost immediately she noted drainage on her clothing although in spite of her instructions the contrary she did not come back to the clinic. She has not been specifically addressing this and is continued to no drainage without other symptoms either local or systemic 11/29/17; I follow this woman monthly. Last time the wound on her right buttock was 1.8 cm today measuring at 1.4 there has been gradual improvement in this. Concerning is the fact that the patient says that Dr. Sampson Goon of infectious disease changed her from doxycycline to Augmentin apparently because of the elevated inflammatory markers. She also said he did a culture of this area but she is not heard the results. I don't think I have his information available on care everywhere however I will check this. She is using Hydrofera Blue rope. The patient and her intake nurse report that she is still having identifiable drainage which certainly makes it  clear that this is not closed. 01/02/17 on evaluation today patient appears to be doing fairly well in regard to her left gluteal wound. She has been continuing with the [facility] Dressing's here in our office. We actually see her once a month and then subsequently are performing weekly dressing changes with Hydrofera Blue Dressing rope during nurse visits one time a week. Today there really was not much drainage at this point. However this has happened previously where she had no drainage and was essentially thought to be close internally although the external had not completely pulled together. However then she reopened and began draining again. He has been mentioned the possibility of her seeing plastic surgery to try to get this finally and completely healed. 01/18/18 she is here in follow-up evaluation for right ischial pressure ulcer. She has an appointment with [facility] plastic surgery on 2/8 for evaluation. She continues to have minimal drainage at the tip of the dressing product. She continues to smoke, admits to 8-10 cigarettes per day. No change in treatment plan 02/06/18 on evaluation today patient appears to be doing about the same in regard to her right Ischial wound. She has been tolerating the dressing changes without complication. She did have her appointment at [facility] plastic surgery she was not really impressed. They stated that due to her weight they were unable to perform a flap and subsequently recommended the only thing they could attempt would be to exercise around the ulcer and then attempt to suture it together. The question is whether this would be effective or not. Patient really is not wanting to proceed down that road. Nonetheless she really have not changed much in regard to her wound measurements. 03/06/18 on evaluation today patient states that last week when I perform the silver nitrate that she  actually had some burning pain for about two days following. Since that time  the area has been very dry and she states that it is also been very dark and scabbed around the area. With that being said other than this she feels that the hope was the fact that it was burning was a good sign and that they would be improvement but the really does not appear to be significant improvement at this point. We have tried a lot of different dressings for her most recently Hydrofera Blue Dressing, silver alginate rope, and collagen. We have never attempted Endoform which I think could be a possibility for her. 03/22/18-She is here for a follow-up evaluation of her right ischial pressure ulcer. There is no drainage on her dressings and what appears to be epidermal debris in the ulcer; she has no pain. The ulcer was cleanse with betadine, I believe it is healed, despite remaining a  1" depth. We will place a dry dressing over and evaluate next week for drainage, if no drainage she will be discharged at that time. Patient History Information obtained from Patient. PREET, MANGANO ([id number]) Family History Cancer - Mother, Diabetes - Father, Hypertension - Mother,Father, Stroke - Mother, No family history of Heart Disease, Kidney Disease, Lung Disease, Seizures, Thyroid Problems. Social History Former smoker, Marital Status - Divorced, Alcohol Use - Never, Drug Use - Prior History, Caffeine Use - Rarely. Medical History Hospitalization/Surgery History - 04/09/2016, [facility], [facility]. Objective Constitutional Vitals Time Taken: 8:08 AM, Height: 63 in, Weight: 257 lbs, BMI: 45.5, Temperature: 98.3 F, Pulse: 84 bpm, Respiratory Rate: 18 breaths/min, Blood Pressure: 116/68 mmHg. Integumentary (Hair, Skin) Wound #3 status is Open. Original cause of wound was Pressure Injury. The wound is located on the Right Gluteal fold. The wound measures 0.5cm length x 0.5cm width x 1.2cm depth; 0.196cm^2 area and 0.236cm^3 volume. There is Fat Layer (Subcutaneous Tissue) Exposed exposed. There is no  tunneling or undermining noted. There is a medium amount of serous drainage noted. The wound margin is distinct with the outline attached to the wound base. There is no granulation within the wound bed. There is a large (67-100%) amount of necrotic tissue within the wound bed including Eschar and Adherent Slough. The periwound skin appearance did not exhibit: Callus, Crepitus, Excoriation, Induration, Rash, Scarring, Dry/Scaly, Maceration, Atrophie Blanche, Cyanosis, Ecchymosis, Hemosiderin Staining, Mottled, Pallor, Rubor, Erythema. Periwound temperature was noted as No Abnormality. The periwound has tenderness on palpation. 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 ([id number]) Clean wound with Normal Saline. Anesthetic (add to Medication List): Wound #3 Right Gluteal fold: Topical Lidocaine 4% cream applied to wound bed prior to debridement (In Clinic Only). Skin Barriers/Peri-Wound Care: Wound #3 Right Gluteal fold: Skin Prep Secondary Dressing: Wound #3 Right Gluteal fold: [place] Dressing Change Frequency: Wound #3 Right Gluteal fold: Change dressing every week Follow-up Appointments: Return Appointment in 2 weeks. - MD appts opposites weeks Nurse Visit as needed - opposite weeks Off-Loading: Wound #3 Right Gluteal fold: Turn and reposition every 2 hours Additional Orders / Instructions: Wound #3 Right Gluteal fold: Increase protein intake. The following medication(s) was prescribed: lidocaine topical 4 % cream 1 1 cream topical was prescribed at facility 1. dry dressing 2. follow up next week Electronic Signature(s) Signed: 03/22/2018 9:00:55 AM By: Bonnell Public Entered By: Bonnell Public on 03/22/2018  09:00:55 LILAH, MIJANGOS  ([id number]) -------------------------------------------------------------------------------- ROS/PFSH Details Patient Name: DARNETTA, KESSELMAN Date of Service: 03/22/2018 8:00 AM Medical Record Number: [id number] Patient Account Number: [id number] Date of Birth/Sex: 1961/02/07 (57 y.o. F) Treating RN: Phillis Haggis Primary Care Provider: Joen [name] Other Clinician: Referring Provider: Joen [name] Treating Provider/Extender: Kathreen Cosier in Treatment: 78 Information Obtained From Patient Wound History Do you currently have one or more open woundso Yes How many open wounds do you currently haveo 3 Approximately how long have you had your woundso since April 2017 How have you been treating your wound(s) until nowo silver Has your wound(s) ever healed and then re-openedo No Have you had any lab work done in the past montho No Have you tested positive for an antibiotic resistant organism (MRSA, VRE)o No Have you tested positive for osteomyelitis (bone infection)o No Have you had any tests for circulation on your legso No Have you had other problems associated with your woundso Swelling Eyes Medical History: Negative for: Cataracts; Glaucoma; Optic Neuritis Ear/Nose/Mouth/Throat Medical History: Negative for: Chronic sinus problems/congestion; Middle ear problems Hematologic/Lymphatic Medical History: Negative for: Anemia; Hemophilia; Human Immunodeficiency Virus; Lymphedema; Sickle Cell Disease Respiratory Medical History: Positive for: Asthma Negative for: Aspiration; Chronic Obstructive Pulmonary Disease (COPD); Pneumothorax; Sleep Apnea; Tuberculosis Cardiovascular Medical History: Positive for: Hypertension Negative for: Angina; Arrhythmia; Congestive Heart Failure; Coronary Artery Disease; Deep Vein Thrombosis; Hypotension; Myocardial Infarction; Peripheral Arterial Disease; Peripheral Venous Disease; Phlebitis; Vasculitis Gastrointestinal Medical History: Negative  for: Cirrhosis ; Colitis; Crohnos; Hepatitis A; Hepatitis B; Hepatitis C Endocrine Kreiser, Adylene L. ([id number]) Medical History: Positive for: Type II Diabetes Negative for: Type I Diabetes Time with diabetes: 30 yrs Treated with: Insulin Blood sugar tested every day: Yes Tested : Blood sugar testing results: Breakfast: 121 Genitourinary Medical History: Negative for: End Stage Renal Disease Immunological Medical History: Negative for: Lupus Erythematosus; Raynaudos; Scleroderma Integumentary (Skin) Medical History: Negative for: History of Burn; History of pressure wounds Musculoskeletal Medical History: Negative for: Gout; Rheumatoid Arthritis; Osteoarthritis; Osteomyelitis Neurologic Medical History: Positive for: Neuropathy Negative for: Dementia; Quadriplegia; Paraplegia; Seizure Disorder Oncologic Medical History: Negative for: Received Chemotherapy; Received Radiation Psychiatric Medical History: Negative for: Anorexia/bulimia; Confinement Anxiety Immunizations Pneumococcal Vaccine: Received Pneumococcal Vaccination: No Implantable Devices Hospitalization / Surgery History Name of Hospital Purpose of Hospitalization/Surgery Date [facility] AMS 04/09/2016 Family and Social History Cancer: Yes - Mother; Diabetes: Yes - Father; Heart Disease: No; Hypertension: Yes - Mother,Father; Kidney Disease: No; Lung Disease: No; Seizures: No; Stroke: Yes - Mother; Thyroid Problems: No; Former smoker; Marital Status - Divorced; [place], [place]. ([id number]) Alcohol Use: Never; Drug Use: Prior History; Caffeine Use: Rarely; Advanced Directives: No; Patient does not want information on Advanced Directives; Living Will: No; Medical Power of Attorney: No Physician Affirmation I have reviewed and agree with the above information. Electronic Signature(s) Signed: 03/22/2018 4:15:03 PM By: Alejandro Mulling Signed: 03/22/2018 9:02:48 PM By: Bonnell Public Entered By: Bonnell Public on 03/22/2018  08:54:58 Tsuchiya, [name] ([id number]) -------------------------------------------------------------------------------- SuperBill Details Patient Name: [name] Date of Service: 03/22/2018 Medical Record Number: [id number] Patient Account Number: [id number] Date of Birth/Sex: 13-Sep-1961 (57 y.o. F) Treating RN: Phillis Haggis Primary Care Provider: Joen [name] Other Clinician: Referring Provider: Joen [name] Treating Provider/Extender: Kathreen Cosier in Treatment: 78 Diagnosis Coding ICD-10 Codes Code Description L89.314 Pressure ulcer of right buttock, stage 4 E11.42 Type 2 diabetes mellitus with diabetic polyneuropathy Facility Procedures CPT4 Code: [id number] Description: 99213 - WOUND CARE VISIT-LEV 3 EST PT Modifier: Quantity: 1 Physician  Procedures CPT4 Code: 1610960 Description: 99213 - WC PHYS LEVEL 3 - EST PT ICD-10 Diagnosis Description L89.314 Pressure ulcer of right buttock, stage 4 E11.42 Type 2 diabetes mellitus with diabetic polyneuropathy Modifier: Quantity: 1 Electronic Signature(s) Signed: 03/22/2018 9:55:53 AM By: Alejandro Mulling Signed: 03/22/2018 9:02:48 PM By: Bonnell Public Previous Signature: 03/22/2018 9:01:46 AM Version By: Bonnell Public Entered By: Alejandro Mulling on 03/22/2018 09:55:52

## 2018-03-26 NOTE — Progress Notes (Signed)
Katelyn, CZARNECKI ([id number]) Visit Report for 03/22/2018 Arrival Information Details Patient Name: [name], [name] Date of Service: 03/22/2018 8:00 AM Medical Record Number: [id number] Patient Account Number: [id number] Date of Birth/Sex: [birth year] (57 y.o. F) Treating RN: Roger Shelter Primary Care Ibrahim Mcpheeters: Lavera Guise Other Clinician: Referring Broc Caspers: Lavera Guise Treating Taylen Osorto/Extender: Cathie Olden in Treatment: 39 Visit Information History Since Last Visit All ordered tests and consults were completed: No Patient Arrived: Wheel Chair Added or deleted any medications: No Arrival Time: 08:07 Any new allergies or adverse reactions: No Accompanied By: self Had a fall or experienced change in No activities of daily living that may affect Transfer Assistance: None risk of falls: Patient Identification Verified: Yes Signs or symptoms of abuse/neglect since last visito No Patient Requires Transmission-Based No Hospitalized since last visit: No Precautions: Implantable device outside of the clinic excluding No Patient Has Alerts: Yes cellular tissue based products placed in the center Patient Alerts: DM II since last visit: Pain Present Now: No Electronic Signature(s) Signed: 03/22/2018 9:25:45 AM By: Roger Shelter Entered By: Roger Shelter on 03/22/2018 08:08:00 Fier, [name] ([id number]) -------------------------------------------------------------------------------- Clinic Level of Care Assessment Details Patient Name: [name] Date of Service: 03/22/2018 8:00 AM Medical Record Number: [id number] Patient Account Number: [id number] Date of Birth/Sex: 03/10/1961 (57 y.o. F) Treating RN: Ahmed Prima Primary Care Nasreen Goedecke: Lavera Guise Other Clinician: Referring Lynix Bonine: Lavera Guise Treating Richell Corker/Extender: Cathie Olden in Treatment: 34 Clinic Level of Care Assessment Items TOOL 4 Quantity Score X - Use when only an EandM is performed  on FOLLOW-UP visit 1 0 ASSESSMENTS - Nursing Assessment / Reassessment X - Reassessment of Co-morbidities (includes updates in patient status) 1 10 X- 1 5 Reassessment of Adherence to Treatment Plan ASSESSMENTS - Wound and Skin Assessment / Reassessment X - Simple Wound Assessment / Reassessment - one wound 1 5 [] - 0 Complex Wound Assessment / Reassessment - multiple wounds [] - 0 Dermatologic / Skin Assessment (not related to wound area) ASSESSMENTS - Focused Assessment [] - Circumferential Edema Measurements - multi extremities 0 [] - 0 Nutritional Assessment / Counseling / Intervention [] - 0 Lower Extremity Assessment (monofilament, tuning fork, pulses) [] - 0 Peripheral Arterial Disease Assessment (using hand held doppler) ASSESSMENTS - Ostomy and/or Continence Assessment and Care [] - Incontinence Assessment and Management 0 [] - 0 Ostomy Care Assessment and Management (repouching, etc.) PROCESS - Coordination of Care X - Simple Patient / Family Education for ongoing care 1 15 [] - 0 Complex (extensive) Patient / Family Education for ongoing care [] - 0 Staff obtains Programmer, systems, Records, Test Results / Process Orders [] - 0 Staff telephones HHA, Nursing Homes / Clarify orders / etc [] - 0 Routine Transfer to another Facility (non-emergent condition) [] - 0 Routine Hospital Admission (non-emergent condition) [] - 0 New Admissions / Biomedical engineer / Ordering NPWT, Apligraf, etc. [] - 0 Emergency Hospital Admission (emergent condition) X- 1 10 Simple Discharge Coordination Mineo, Sandria L. ([id number]) [] - 0 Complex (extensive) Discharge Coordination PROCESS - Special Needs [] - Pediatric / Minor Patient Management 0 [] - 0 Isolation Patient Management [] - 0 Hearing / Language / Visual special needs [] - 0 Assessment of Community assistance (transportation, D/C planning, etc.) [] - 0 Additional assistance / Altered mentation [] - 0 Support Surface(s)  Assessment (bed, cushion, seat, etc.) INTERVENTIONS - Wound Cleansing / Measurement X - Simple Wound Cleansing - one wound 1 5 [] - 0 Complex Wound  Cleansing - multiple wounds X- 1 5 Wound Imaging (photographs - any number of wounds) [] - 0 Wound Tracing (instead of photographs) X- 1 5 Simple Wound Measurement - one wound [] - 0 Complex Wound Measurement - multiple wounds INTERVENTIONS - Wound Dressings X - Small Wound Dressing one or multiple wounds 1 10 [] - 0 Medium Wound Dressing one or multiple wounds [] - 0 Large Wound Dressing one or multiple wounds X- 1 5 Application of Medications - topical [] - 0 Application of Medications - injection INTERVENTIONS - Miscellaneous [] - External ear exam 0 [] - 0 Specimen Collection (cultures, biopsies, blood, body fluids, etc.) [] - 0 Specimen(s) / Culture(s) sent or taken to Lab for analysis [] - 0 Patient Transfer (multiple staff / Hoyer Lift / Similar devices) [] - 0 Simple Staple / Suture removal (25 or less) [] - 0 Complex Staple / Suture removal (26 or more) [] - 0 Hypo / Hyperglycemic Management (close monitor of Blood Glucose) [] - 0 Ankle / Brachial Index (ABI) - do not check if billed separately X- 1 5 Vital Signs Hagmann, Nikitha L. ([id number]) Has the patient been seen at the hospital within the last three years: Yes Total Score: 80 Level Of Care: New/Established - Level 3 Electronic Signature(s) Signed: 03/22/2018 4:15:03 PM By: Pinkerton, Debra Entered By: Pinkerton, Debra on 03/22/2018 09:55:43 Previti, Bernis L. ([id number]) -------------------------------------------------------------------------------- Encounter Discharge Information Details Patient Name: [name], [name] Date of Service: 03/22/2018 8:00 AM Medical Record Number: [id number] Patient Account Number: [id number] Date of Birth/Sex: 10/06/1961 (56 y.o. F) Treating RN: Flinchum, Cheryl Primary Care : BLISS, [name] Other Clinician: Referring  : BLISS, [name] Treating /Extender: Coulter, Leah Weeks in Treatment: 78 Encounter Discharge Information Items Discharge Pain Level: 0 Discharge Condition: Stable Ambulatory Status: Wheelchair Discharge Destination: Home Transportation: Private Auto Schedule Follow-up Appointment: Yes Medication Reconciliation completed and No provided to Patient/Care : Patient Clinical Summary of Care: Declined Electronic Signature(s) Signed: 03/26/2018 10:06:49 AM By: Moore, Shelia Entered By: Moore, Shelia on 03/22/2018 08:43:20 Moudy, Amritha L. ([id number]) -------------------------------------------------------------------------------- Lower Extremity Assessment Details Patient Name: Birnbaum, Anahy L. Date of Service: 03/22/2018 8:00 AM Medical Record Number: [id number] Patient Account Number: [id number] Date of Birth/Sex: 02/18/1961 (56 y.o. F) Treating RN: Flinchum, Cheryl Primary Care : BLISS, [name] Other Clinician: Referring : BLISS, [name] Treating /Extender: Coulter, Leah Weeks in Treatment: 78 Electronic Signature(s) Signed: 03/22/2018 9:25:45 AM By: Flinchum, Cheryl Entered By: Flinchum, Cheryl on 03/22/2018 08:16:46 Swager, Makayia L. ([id number]) -------------------------------------------------------------------------------- Multi Wound Chart Details Patient Name: Bergum, Endiya L. Date of Service: 03/22/2018 8:00 AM Medical Record Number: [id number] Patient Account Number: [id number] Date of Birth/Sex: 05/19/1961 (56 y.o. F) Treating RN: Pinkerton, Debi Primary Care : BLISS, [name] Other Clinician: Referring : BLISS, [name] Treating /Extender: Coulter, Leah Weeks in Treatment: 78 Vital Signs Height(in): 63 Pulse(bpm): 84 Weight(lbs): 257 Blood Pressure(mmHg): 116/68 Body Mass Index(BMI): 46 Temperature(°F): 98.3 Respiratory Rate 18 (breaths/min): Photos: [3:No Photos] [N/A:N/A] Wound Location: [3:Right Gluteal  fold] [N/A:N/A] Wounding Event: [3:Pressure Injury] [N/A:N/A] Primary Etiology: [3:Pressure Ulcer] [N/A:N/A] Comorbid History: [3:Asthma, Hypertension, Type II Diabetes, Neuropathy] [N/A:N/A] Date Acquired: [3:04/21/2016] [N/A:N/A] Weeks of Treatment: [3:78] [N/A:N/A] Wound Status: [3:Open] [N/A:N/A] Measurements L x W x D [3:0.5x0.5x1.2] [N/A:N/A] (cm) Area (cm²) : [3:0.196] [N/A:N/A] Volume (cm³) : [3:0.236] [N/A:N/A] % Reduction in Area: [3:93.60%] [N/A:N/A] % Reduction in Volume: [3:98.60%] [N/A:N/A] Classification: [3:Category/Stage IV] [N/A:N/A] Exudate Amount: [3:Medium] [N/A:N/A] Exudate Type: [3:Serous] [N/A:N/A] Exudate Color: [3:amber] [N/A:N/A] Wound Margin: [3:Distinct, outline attached] [N/A:N/A] Granulation   Amount: [3:None Present (0%)] [N/A:N/A] Necrotic Amount: [3:Large (67-100%)] [N/A:N/A] Necrotic Tissue: [3:Eschar, Adherent Slough] [N/A:N/A] Exposed Structures: [3:Fat Layer (Subcutaneous Tissue) Exposed: Yes Fascia: No Tendon: No Muscle: No Joint: No Bone: No] [N/A:N/A] Epithelialization: [3:Small (1-33%)] [N/A:N/A] Periwound Skin Texture: [3:Excoriation: No Induration: No Callus: No Crepitus: No Rash: No Scarring: No] [N/A:N/A] Periwound Skin Moisture: Maceration: No N/A N/A Dry/Scaly: No Periwound Skin Color: Atrophie Blanche: No N/A N/A Cyanosis: No Ecchymosis: No Erythema: No Hemosiderin Staining: No Mottled: No Pallor: No Rubor: No Temperature: No Abnormality N/A N/A Tenderness on Palpation: Yes N/A N/A Wound Preparation: Ulcer Cleansing: N/A N/A Rinsed/Irrigated with Saline Topical Anesthetic Applied: Other: lidocaine 4% Treatment Notes Wound #3 (Right Gluteal fold) 1. Cleansed with: Clean wound with Normal Saline 3. Peri-wound Care: Other peri-wound care (specify in notes) 5. Secondary Dressing Applied Bordered Foam Dressing Notes betadine Electronic Signature(s) Signed: 03/22/2018 8:51:30 AM By: Coulter, Leah Entered By: Coulter,  Leah on 03/22/2018 08:51:30 Slay, Shahira L. ([id number]) -------------------------------------------------------------------------------- Multi-Disciplinary Care Plan Details Patient Name: Bargar, Alzena L. Date of Service: 03/22/2018 8:00 AM Medical Record Number: [id number] Patient Account Number: [id number] Date of Birth/Sex: 11/02/1961 (56 y.o. F) Treating RN: Pinkerton, Debi Primary Care : BLISS, [name] Other Clinician: Referring : BLISS, [name] Treating /Extender: Coulter, Leah Weeks in Treatment: 78 Active Inactive ` Abuse / Safety / Falls / Self Care Management Nursing Diagnoses: Potential for falls Goals: Patient will remain injury free Date Initiated: 09/21/2016 Target Resolution Date: 03/25/2017 Goal Status: Active Interventions: Assess fall risk on admission and as needed Notes: ` Nutrition Nursing Diagnoses: Imbalanced nutrition Potential for alteratiion in Nutrition/Potential for imbalanced nutrition Goals: Patient/caregiver agrees to and verbalizes understanding of need to use nutritional supplements and/or vitamins as prescribed Date Initiated: 09/21/2016 Target Resolution Date: 03/25/2017 Goal Status: Active Patient/caregiver verbalizes understanding of need to maintain therapeutic glucose control per primary care physician Date Initiated: 09/21/2016 Target Resolution Date: 03/25/2017 Goal Status: Active Interventions: Assess patient nutrition upon admission and as needed per policy Notes: ` Orientation to the Wound Care Program Nursing Diagnoses: Knowledge deficit related to the wound healing center program Goals: Patient/caregiver will verbalize understanding of the Wound Healing Center Program Argueta, Deborha L. ([id number]) Date Initiated: 09/21/2016 Target Resolution Date: 03/25/2017 Goal Status: Active Interventions: Provide education on orientation to the wound center Notes: ` Pain, Acute or Chronic Nursing Diagnoses: Pain, acute or  chronic: actual or potential Potential alteration in comfort, pain Goals: Patient will verbalize adequate pain control and receive pain control interventions during procedures as needed Date Initiated: 09/21/2016 Target Resolution Date: 03/25/2017 Goal Status: Active Patient/caregiver will verbalize adequate pain control between visits Date Initiated: 09/21/2016 Target Resolution Date: 03/25/2017 Goal Status: Active Patient/caregiver will verbalize comfort level met Date Initiated: 09/21/2016 Target Resolution Date: 03/25/2017 Goal Status: Active Interventions: Assess comfort goal upon admission Complete pain assessment as per visit requirements Notes: ` Wound/Skin Impairment Nursing Diagnoses: Impaired tissue integrity Goals: Ulcer/skin breakdown will have a volume reduction of 30% by week 4 Date Initiated: 09/21/2016 Target Resolution Date: 03/25/2017 Goal Status: Active Ulcer/skin breakdown will have a volume reduction of 50% by week 8 Date Initiated: 09/21/2016 Target Resolution Date: 03/25/2017 Goal Status: Active Ulcer/skin breakdown will have a volume reduction of 80% by week 12 Date Initiated: 09/21/2016 Target Resolution Date: 03/25/2017 Goal Status: Active Interventions: Assess ulceration(s) every visit Notes: Fudala, Laraya L. ([id number]) Electronic Signature(s) Signed: 03/22/2018 4:15:03 PM By: Pinkerton, Debra Entered By: Pinkerton, Debra on 03/22/2018 08:21:47 Schriever, Nataliyah L. ([id number]) -------------------------------------------------------------------------------- Pain Assessment Details Patient Name: Brereton,   Bindi L. Date of Service: 03/22/2018 8:00 AM Medical Record Number: [id number] Patient Account Number: [id number] Date of Birth/Sex: 12/06/1961 (56 y.o. F) Treating RN: Flinchum, Cheryl Primary Care Johsua Shevlin: BLISS, [name] Other Clinician: Referring Nesta Kimple: BLISS, [name] Treating Darnetta Kesselman/Extender: Coulter, Leah Weeks in Treatment: 78 Active Problems Location  of Pain Severity and Description of Pain Patient Has Paino No Site Locations Pain Management and Medication Current Pain Management: Electronic Signature(s) Signed: 03/22/2018 9:25:45 AM By: Flinchum, Cheryl Entered By: Flinchum, Cheryl on 03/22/2018 08:08:10 Saari, Adelynne L. ([id number]) -------------------------------------------------------------------------------- Patient/Caregiver Education Details Patient Name: Foxworth, Offie L. Date of Service: 03/22/2018 8:00 AM Medical Record Number: [id number] Patient Account Number: [id number] Date of Birth/Gender: 09/14/1961 (56 y.o. F) Treating RN: Flinchum, Cheryl Primary Care Physician: BLISS, [name] Other Clinician: Referring Physician: BLISS, [name] Treating Physician/Extender: Coulter, Leah Weeks in Treatment: 78 Education Assessment Education Provided To: Patient Education Topics Provided Wound/Skin Impairment: Handouts: Caring for Your Ulcer Methods: Explain/Verbal Responses: State content correctly Electronic Signature(s) Signed: 03/22/2018 9:25:45 AM By: Flinchum, Cheryl Entered By: Flinchum, Cheryl on 03/22/2018 08:35:23 Melland, Damesha L. ([id number]) -------------------------------------------------------------------------------- Wound Assessment Details Patient Name: Rockford, Jalesa L. Date of Service: 03/22/2018 8:00 AM Medical Record Number: [id number] Patient Account Number: [id number] Date of Birth/Sex: 10/28/1961 (56 y.o. F) Treating RN: Flinchum, Cheryl Primary Care Mickael Mcnutt: BLISS, [name] Other Clinician: Referring Chandler Swiderski: BLISS, [name] Treating Nataliyah Packham/Extender: Coulter, Leah Weeks in Treatment: 78 Wound Status Wound Number: 3 Primary Pressure Ulcer Etiology: Wound Location: Right Gluteal fold Wound Status: Open Wounding Event: Pressure Injury Comorbid Asthma, Hypertension, Type II Diabetes, Date Acquired: 04/21/2016 History: Neuropathy Weeks Of Treatment: 78 Clustered Wound: No Photos Photo Uploaded By: Flinchum,  Cheryl on 03/22/2018 10:35:12 Wound Measurements Length: (cm) 0.5 Width: (cm) 0.5 Depth: (cm) 1.2 Area: (cm²) 0.196 Volume: (cm³) 0.236 % Reduction in Area: 93.6% % Reduction in Volume: 98.6% Epithelialization: Small (1-33%) Tunneling: No Undermining: No Wound Description Classification: Category/Stage IV Wound Margin: Distinct, outline attached Exudate Amount: Medium Exudate Type: Serous Exudate Color: amber Foul Odor After Cleansing: No Slough/Fibrino Yes Wound Bed Granulation Amount: None Present (0%) Exposed Structure Necrotic Amount: Large (67-100%) Fascia Exposed: No Necrotic Quality: Eschar, Adherent Slough Fat Layer (Subcutaneous Tissue) Exposed: Yes Tendon Exposed: No Muscle Exposed: No Joint Exposed: No Bone Exposed: No Periwound Skin Texture Lohnes, Leen L. ([id number]) Texture Color No Abnormalities Noted: No No Abnormalities Noted: No Callus: No Atrophie Blanche: No Crepitus: No Cyanosis: No Excoriation: No Ecchymosis: No Induration: No Erythema: No Rash: No Hemosiderin Staining: No Scarring: No Mottled: No Pallor: No Moisture Rubor: No No Abnormalities Noted: No Dry / Scaly: No Temperature / Pain Maceration: No Temperature: No Abnormality Tenderness on Palpation: Yes Wound Preparation Ulcer Cleansing: Rinsed/Irrigated with Saline Topical Anesthetic Applied: Other: lidocaine 4%, Treatment Notes Wound #3 (Right Gluteal fold) 1. Cleansed with: Clean wound with Normal Saline 3. Peri-wound Care: Other peri-wound care (specify in notes) 5. Secondary Dressing Applied Bordered Foam Dressing Notes betadine Electronic Signature(s) Signed: 03/22/2018 9:25:45 AM By: Flinchum, Cheryl Entered By: Flinchum, Cheryl on 03/22/2018 08:16:34 Ramella, Baby L. ([id number]) -------------------------------------------------------------------------------- Vitals Details Patient Name: Cawley, Akiko L. Date of Service: 03/22/2018 8:00 AM Medical Record Number:  [id number] Patient Account Number: [id number] Date of Birth/Sex: 03/17/1961 (56 y.o. F) Treating RN: Flinchum, Cheryl Primary Care Alysiah Suppa: BLISS, [name] Other Clinician: Referring Dayden Viverette: BLISS, [name] Treating Azie Mcconahy/Extender: Coulter, Leah Weeks in Treatment: 78 Vital Signs Time Taken: 08:08 Temperature (°F): 98.3 Height (in): 63 Pulse (bpm): 84 Weight (lbs): 257 Respiratory Rate (breaths/min): 18 Body Mass Index (BMI): 45.5 Blood   Pressure (mmHg): 116/68 Reference Range: 80 - 120 mg / dl Electronic Signature(s) Signed: 03/22/2018 9:25:45 AM By: Roger Shelter Entered By: Roger Shelter on 03/22/2018 08:09:46

## 2018-03-28 ENCOUNTER — Ambulatory Visit: Payer: BLUE CROSS/BLUE SHIELD | Attending: Family Medicine

## 2018-03-28 DIAGNOSIS — M6281 Muscle weakness (generalized): Secondary | ICD-10-CM | POA: Diagnosis present

## 2018-03-28 DIAGNOSIS — R262 Difficulty in walking, not elsewhere classified: Secondary | ICD-10-CM | POA: Diagnosis present

## 2018-03-28 DIAGNOSIS — R2689 Other abnormalities of gait and mobility: Secondary | ICD-10-CM | POA: Insufficient documentation

## 2018-03-28 NOTE — Therapy (Signed)
[facility] MAIN [facility] SERVICES [address] [place], [place], [address] Phone: [phone]   Fax:  [phone]  Physical Therapy Treatment  Patient Details  Name: [name] MRN: [id number] Date of Birth: 1961/03/23 Referring Provider: Dr. Karenann Cai   Encounter Date: 03/28/2018  PT End of Session - 03/28/18 0811    Visit Number  3    Number of Visits  8    Date for PT Re-Evaluation  04/25/18    PT Start Time  0800    PT Stop Time  0844    PT Time Calculation (min)  44 min    Equipment Utilized During Treatment  Gait belt bilateral AFO's    Activity Tolerance  Patient tolerated treatment well;Patient limited by fatigue    Behavior During Therapy  [facility] for tasks assessed/performed       Past Medical History:  Diagnosis Date  . Acute pain of right shoulder 05/19/2016  . Acute PN (pyelonephritis) 05/18/2016  . Acute pyelonephritis 05/18/2016  . Anxiety   . Arthritis   . Asthma   . Diabetes mellitus without complication (HCC)   . GERD (gastroesophageal reflux disease)   . Glaucoma   . Hyperlipemia   . Hyperlipemia   . Hypertension     Past Surgical History:  Procedure Laterality Date  . ANTERIOR CRUCIATE LIGAMENT REPAIR      There were no vitals filed for this visit.  Subjective Assessment - 03/28/18 0805    Subjective  Patient has increased burning and tingling in her feet today. Started this morning. Compliant with HEP.     Pertinent History  Patient presented in manual wheelchair with bilateral AFOs. April of 2017 patient came home from work reported not feeling good, The next couple days was not feeling good. Went to doctor who called EMS to take her to the hospital. Was unable to get a number on her sugars initially due to patient not taking her medicine due to not feeling well and not eating. Sugar was read to be in the 700's, also found multiple strains of a UTI/sepsis while there. Was in ICU for 12 days at [facility] then  went to [facility]. While at [facility] was not walked or gotten up so after the fourth months there she returned home with in home therapy. Has not had any physical therapy since October. Gets around house with walker ok or in her wheelchair. Has pressure ulcer on Right buttocks with pmh including GERD, DM type II, HTN, and polyneuropathy. Patient wants to get stronger to be able to walk and stand again for more independence and to help her mother.     Limitations  Lifting;Standing;Walking;House hold activities;Other (comment)    How long can you sit comfortably?  hour    How long can you stand comfortably?  stand 5 minutes with walker     How long can you walk comfortably?  10 ft    Patient Stated Goals  walk better, want to drive, stand for longer, help her mom    Currently in Pain?  Yes    Pain Score  8     Pain Location  Foot    Pain Orientation  Right;Left    Pain Descriptors / Indicators  Burning;Tingling    Pain Type  Chronic pain;Neuropathic pain    Pain Onset  More than a month ago    Pain Frequency  Constant         TREATMENT  Seated:   Marches BLE x12 with noted mild compensatory posterior trunk lean with raising LLE; compensation corrected by utilizing forward trunk lean to increase core activation.   Gluteal squeezes 14x    LAQ10x  Hip abduction.OTB 10s   Standing  Hamsting curls 8x BLE , LLE unable to perform full motion of exercise due to limited strength   Step up and downs 4" step, BUE support. . Bil knee hyperextension noted with step down 6x BLE, : L hip pain increased after session  Hip extension/ step back10x  Ambulate in // bars 4x length : knee hyperextension with weight acceptance, verbal cues required for heel strike due to preference for foot flat contact. BUE support.   Pt requires long rest breaks between sets and between exercises due to quickness to fatigue                          PT Short Term Goals - 02/28/18 1058      PT  SHORT TERM GOAL #1   Title  Patient will be independent in home exercise program to improve strength/mobility for better functional independence with ADLs.    Baseline  HEP added    Time  2    Period  Weeks    Status  New    Target Date  03/14/18      PT SHORT TERM GOAL #2   Title   Patient (57 years old) will complete five times sit to stand test in < 20 seconds indicating an increased LE strength and improved balance    Baseline  3/6: 46 seconds with UE support     Time  2    Period  Weeks    Status  New    Target Date  03/14/18      PT SHORT TERM GOAL #3   Title  Patient will ambulate 50 ft without rest breaks to increase functional independence.     Baseline  ambulates 30 ft     Time  2    Period  Weeks    Status  New    Target Date  03/14/18      PT SHORT TERM GOAL #4   Title  Patient will increase BLE gross strength to 4-/5 as to improve functional strength for independent gait, increased standing tolerance and increased ADL ability.    Baseline  LLE 3/5 gross     Time  2    Period  Weeks    Status  New    Target Date  03/14/18        PT Long Term Goals - 02/28/18 1105      PT LONG TERM GOAL #1   Title  Patient will increase BLE gross strength to 4+/5 as to improve functional strength for independent gait, increased standing tolerance and increased ADL ability.    Baseline  3/6: LLE gross 3/5    Time  8    Period  Weeks    Status  New    Target Date  04/25/18      PT LONG TERM GOAL #2   Title  Patient will increase lower extremity functional scale to >60/80 to demonstrate improved functional mobility and increased tolerance with ADLs.     Baseline  3/6: 24/80    Time  8    Period  Weeks    Status  New    Target Date  04/25/18      PT  LONG TERM GOAL #3   Title  Patient (57 years old) will complete five times sit to stand test in < 10 seconds indicating an increased LE strength and improved balance    Baseline  3/6: 46 seconds with UE support     Time   8    Period  Weeks    Status  New    Target Date  04/25/18      PT LONG TERM GOAL #4   Title  Patient will increase 10 meter walk test to >1.19m/s as to improve gait speed for better community ambulation and to reduce fall risk    Baseline  .11 m/s     Time  8    Period  Weeks    Status  New    Target Date  04/25/18      PT LONG TERM GOAL #5   Title  Patient will increase ABC scale score >80% to demonstrate better functional mobility and better confidence with ADLs.     Baseline  3/6: 28.7%    Time  8    Period  Weeks    Status  New    Target Date  04/25/18            Plan - 03/28/18 7628    Clinical Impression Statement  Patient demonstrated excessive weakness of LLE compared to RLE. Patient ambulates with excessive knee hyperextension upon weight acceptance due to poor quadriceps activation. Step ups resulted in severe fatigue requiring prolonged rest break seated after last session.  Patient will continue to benefit from skilled physical therapy for improved strength and independence.     Rehab Potential  Fair    Clinical Impairments Affecting Rehab Potential  + good motivation, +performing HEP from previous home health -chronicity, body habitus     PT Frequency  1x / week    PT Duration  8 weeks    PT Treatment/Interventions  ADLs/Self Care Home Management;Cryotherapy;Ultrasound;Traction;Moist Heat;Electrical Stimulation;DME Instruction;Gait training;Stair training;Balance training;Therapeutic exercise;Therapeutic activities;Functional mobility training;Neuromuscular re-education;Patient/family education;Orthotic Fit/Training;Manual techniques;Wheelchair mobility training;Passive range of motion;Energy conservation;Taping    PT Next Visit Plan  ambulatory, LE strengthening.     PT Home Exercise Plan  added gluteal strenght and hamstring curls to HEP    Consulted and Agree with Plan of Care  Patient       Patient will benefit from skilled therapeutic intervention in order  to improve the following deficits and impairments:  Abnormal gait, Decreased activity tolerance, Decreased balance, Decreased endurance, Decreased knowledge of precautions, Decreased mobility, Decreased strength, Difficulty walking, Impaired perceived functional ability, Impaired sensation, Postural dysfunction, Improper body mechanics, Pain  Visit Diagnosis: Muscle weakness (generalized)  Other abnormalities of gait and mobility  Difficulty in walking, not elsewhere classified     Problem List Patient Active Problem List   Diagnosis Date Noted  . 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 CKD (chronic kidney disease) stage 3, GFR 30-59 ml/min (HCC) 07/04/2017  . Class 3 drug-induced obesity with serious comorbidity and body mass index (BMI) of 45.0 to 49.9 in adult (HCC) 07/04/2017  . Chronic feet pain (Location of Primary Source of Pain) (Bilateral) (R>L) 04/25/2017  . Diabetic peripheral neuropathy (HCC) (Location of Primary Source of Pain) (Bilateral) (R>L) 04/25/2017  . Chronic lower extremity pain (Location of Secondary source of pain) (Bilateral) (R>L) 04/25/2017  . Chronic pain syndrome 04/25/2017  . Long term (current) use of opiate analgesic 04/25/2017  . Long term prescription opiate use 04/25/2017  . Opiate use 04/25/2017  .  Chronic hand pain (Location of Tertiary source of pain) (Bilateral) (L>R) 04/25/2017  . Carpal tunnel syndrome (Bilateral) (L>R) 04/25/2017  . History of stroke 04/25/2017  . Infected decubitus ulcer, unstageable (HCC) 10/26/2016  . Closed fracture of humerus, surgical neck 05/31/2016  . Closed 3-part fracture of surgical neck of right humerus with delayed healing 05/31/2016  . Pain in shoulder 05/19/2016  . Edema leg 05/18/2016  . 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 Kidney lump 05/18/2016  . Bilateral edema of lower extremity 05/18/2016  . Bilateral leg weakness 05/18/2016  . Kidney mass 05/18/2016  . Pressure ulcer 04/24/2016  . History of  DKA (diabetic ketoacidosis) (HCC) 04/23/2016    Class: History of  . Diabetes mellitus (HCC) 04/23/2016  . Other specified diabetes mellitus with ketoacidosis without coma (HCC) 04/23/2016   Precious Bard, PT, DPT   Precious Bard 03/28/2018, 8:44 AM  [facility] MAIN [facility] SERVICES [address] [place], [place], [address] Phone: [phone]   Fax:  [phone]  Name: Ludia Gartland Minner MRN: [id number] Date of Birth: 01-02-1961

## 2018-03-29 ENCOUNTER — Encounter: Payer: BLUE CROSS/BLUE SHIELD | Attending: Nurse Practitioner

## 2018-03-29 DIAGNOSIS — I10 Essential (primary) hypertension: Secondary | ICD-10-CM | POA: Insufficient documentation

## 2018-03-29 DIAGNOSIS — F17210 Nicotine dependence, cigarettes, uncomplicated: Secondary | ICD-10-CM | POA: Diagnosis not present

## 2018-03-29 DIAGNOSIS — E11622 Type 2 diabetes mellitus with other skin ulcer: Secondary | ICD-10-CM | POA: Insufficient documentation

## 2018-03-29 DIAGNOSIS — E1140 Type 2 diabetes mellitus with diabetic neuropathy, unspecified: Secondary | ICD-10-CM | POA: Insufficient documentation

## 2018-03-29 DIAGNOSIS — J45909 Unspecified asthma, uncomplicated: Secondary | ICD-10-CM | POA: Insufficient documentation

## 2018-03-29 DIAGNOSIS — Z87898 Personal history of other specified conditions: Secondary | ICD-10-CM | POA: Insufficient documentation

## 2018-03-29 DIAGNOSIS — L97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 severity: Secondary | ICD-10-CM | POA: Insufficient documentation

## 2018-03-29 DIAGNOSIS — L89324 Pressure ulcer of left buttock, stage 4: Secondary | ICD-10-CM | POA: Diagnosis not present

## 2018-03-29 DIAGNOSIS — E1151 Type 2 diabetes mellitus with diabetic peripheral angiopathy without gangrene: Secondary | ICD-10-CM | POA: Diagnosis not present

## 2018-03-29 DIAGNOSIS — L89314 Pressure ulcer of right buttock, stage 4: Secondary | ICD-10-CM | POA: Diagnosis present

## 2018-03-29 DIAGNOSIS — L89323 Pressure ulcer of left buttock, stage 3: Secondary | ICD-10-CM | POA: Diagnosis not present

## 2018-03-30 NOTE — Progress Notes (Signed)
VANNIA, POLA ([id number]) Visit Report for 03/29/2018 Arrival Information Details Patient Name: GISELLA, ALWINE Date of Service: 03/29/2018 8:00 AM Medical Record Number: [id number] Patient Account Number: [id number] Date of Birth/Sex: [birth year] (57 y.o. F) Treating RN: Curtis Sites Primary Care Mckenzee Beem: Joen [name] Other Clinician: Referring Hayle Parisi: Joen [name] Treating Lilit Cinelli/Extender: Kathreen Cosier in Treatment: 38 Visit Information History Since Last Visit Added or deleted any medications: No Patient Arrived: Wheel Chair Any new allergies or adverse reactions: No Arrival Time: 08:05 Had a fall or experienced change in No activities of daily living that may affect Accompanied By: self risk of falls: Transfer Assistance: None Signs or symptoms of abuse/neglect since last visito No Patient Identification Verified: Yes Hospitalized since last visit: No Secondary Verification Process Completed: Yes Implantable device outside of the clinic excluding No Patient Requires Transmission-Based No cellular tissue based products placed in the center Precautions: since last visit: Patient Has Alerts: Yes Has Dressing in Place as Prescribed: Yes Patient Alerts: DM II Pain Present Now: No Electronic Signature(s) Signed: 03/29/2018 4:24:22 PM By: Curtis Sites Entered By: Curtis Sites on 03/29/2018 08:11:06 Filip, Jamilette Elbert Ewings ([id number]) -------------------------------------------------------------------------------- Clinic Level of Care Assessment Details Patient Name: [name] Date of Service: 03/29/2018 8:00 AM Medical Record Number: [id number] Patient Account Number: [id number] Date of Birth/Sex: 1961-10-07 (57 y.o. F) Treating RN: Curtis Sites Primary Care Selenne Coggin: Joen [name] Other Clinician: Referring Matisha Termine: Joen [name] Treating Herberto Ledwell/Extender: Kathreen Cosier in Treatment: 53 Clinic Level of Care Assessment Items TOOL 4 Quantity Score []  - Use when  only an EandM is performed on FOLLOW-UP visit 0 ASSESSMENTS - Nursing Assessment / Reassessment X - Reassessment of Co-morbidities (includes updates in patient status) 1 10 X- 1 5 Reassessment of Adherence to Treatment Plan ASSESSMENTS - Wound and Skin Assessment / Reassessment X - Simple Wound Assessment / Reassessment - one wound 1 5 []  - 0 Complex Wound Assessment / Reassessment - multiple wounds []  - 0 Dermatologic / Skin Assessment (not related to wound area) ASSESSMENTS - Focused Assessment []  - Circumferential Edema Measurements - multi extremities 0 []  - 0 Nutritional Assessment / Counseling / Intervention []  - 0 Lower Extremity Assessment (monofilament, tuning fork, pulses) []  - 0 Peripheral Arterial Disease Assessment (using hand held doppler) ASSESSMENTS - Ostomy and/or Continence Assessment and Care []  - Incontinence Assessment and Management 0 []  - 0 Ostomy Care Assessment and Management (repouching, etc.) PROCESS - Coordination of Care X - Simple Patient / Family Education for ongoing care 1 15 []  - 0 Complex (extensive) Patient / Family Education for ongoing care []  - 0 Staff obtains , Records, Test Results / Process Orders []  - 0 Staff telephones HHA, Nursing Homes / Clarify orders / etc []  - 0 Routine Transfer to another Facility (non-emergent condition) []  - 0 Routine Hospital Admission (non-emergent condition) []  - 0 New Admissions / / Ordering NPWT, Apligraf, etc. []  - 0 Emergency Hospital Admission (emergent condition) X- 1 10 Simple Discharge Coordination Barrington, Kearra L. ( ) []  - 0 Complex (extensive) Discharge Coordination PROCESS - Special Needs []  - Pediatric / Minor Patient Management 0 []  - 0 Isolation Patient Management []  - 0 Hearing / Language / Visual special needs []  - 0 Assessment of Community assistance (transportation, D/C planning, etc.) []  - 0 Additional assistance / Altered mentation []   - 0 Support Surface(s) Assessment (bed, cushion, seat, etc.) INTERVENTIONS - Wound Cleansing / Measurement X - Simple Wound Cleansing - one wound 1 5 []  -  0 Complex Wound Cleansing - multiple wounds X- 1 5 Wound Imaging (photographs - any number of wounds) []  - 0 Wound Tracing (instead of photographs) X- 1 5 Simple Wound Measurement - one wound []  - 0 Complex Wound Measurement - multiple wounds INTERVENTIONS - Wound Dressings X - Small Wound Dressing one or multiple wounds 1 10 []  - 0 Medium Wound Dressing one or multiple wounds []  - 0 Large Wound Dressing one or multiple wounds []  - 0 Application of Medications - topical []  - 0 Application of Medications - injection INTERVENTIONS - Miscellaneous []  - External ear exam 0 []  - 0 Specimen Collection (cultures, biopsies, blood, body fluids, etc.) []  - 0 Specimen(s) / Culture(s) sent or taken to Lab for analysis []  - 0 Patient Transfer (multiple staff / Nurse, adult / Similar devices) []  - 0 Simple Staple / Suture removal (25 or less) []  - 0 Complex Staple / Suture removal (26 or more) []  - 0 Hypo / Hyperglycemic Management (close monitor of Blood Glucose) []  - 0 Ankle / Brachial Index (ABI) - do not check if billed separately X- 1 5 Vital Signs Schermerhorn, Shavonne L. ([id number]) Has the patient been seen at the hospital within the last three years: Yes Total Score: 75 Level Of Care: ____ Electronic Signature(s) Signed: 03/29/2018 4:24:22 PM By: Curtis Sites Entered By: Curtis Sites on 03/29/2018 08:39:14 Guiney, [name] ([id number]) -------------------------------------------------------------------------------- Encounter Discharge Information Details Patient Name: [name] Date of Service: 03/29/2018 8:00 AM Medical Record Number: [id number] Patient Account Number: [id number] Date of Birth/Sex: 1961-09-12 (57 y.o. F) Treating RN: Curtis Sites Primary Care Arieanna Pressey: Joen [name] Other Clinician: Referring Yalonda Sample:  Joen [name] Treating Camren Henthorn/Extender: Kathreen Cosier in Treatment: 24 Encounter Discharge Information Items Discharge Pain Level: 0 Discharge Condition: Stable Ambulatory Status: Wheelchair Discharge Destination: Home Private Transportation: Auto Accompanied By: self Schedule Follow-up Appointment: Yes Medication Reconciliation completed and No provided to Patient/Care Amillia Biffle: Clinical Summary of Care: Electronic Signature(s) Signed: 03/29/2018 8:38:48 AM By: Curtis Sites Entered By: Curtis Sites on 03/29/2018 08:38:48 Able, [name] ([id number]) -------------------------------------------------------------------------------- Lower Extremity Assessment Details Patient Name: [name] Date of Service: 03/29/2018 8:00 AM Medical Record Number: [id number] Patient Account Number: [id number] Date of Birth/Sex: 11/22/1961 (57 y.o. F) Treating RN: Curtis Sites Primary Care Ryli Standlee: Joen [name] Other Clinician: Referring Brycelynn Stampley: Joen [name] Treating Nillie Bartolotta/Extender: Kathreen Cosier in Treatment: 47 Electronic Signature(s) Signed: 03/29/2018 4:24:22 PM By: Curtis Sites Entered By: Curtis Sites on 03/29/2018 08:20:09 Harman, [name] ([id number]) -------------------------------------------------------------------------------- Pain Assessment Details Patient Name: [name] Date of Service: 03/29/2018 8:00 AM Medical Record Number: [id number] Patient Account Number: [id number] Date of Birth/Sex: [birth year] (57 y.o. F) Treating RN: Curtis Sites Primary Care Daekwon Beswick: Joen [name] Other Clinician: Referring Domenic Schoenberger: Joen [name] Treating Wen Munford/Extender: Kathreen Cosier in Treatment: 73 Active Problems Location of Pain Severity and Description of Pain Patient Has Paino Yes Site Locations Pain Location: Generalized Pain With Dressing Change: No Duration of the Pain. Constant / Intermittento Constant Character of Pain Describe the Pain:  Burning, Sharp Pain Management and Medication Current Pain Management: Electronic Signature(s) Signed: 03/29/2018 4:24:22 PM By: Curtis Sites Entered By: Curtis Sites on 03/29/2018 08:11:25 Bottenfield, [name] ([id number]) -------------------------------------------------------------------------------- Patient/Caregiver Education Details Patient Name: [name] Date of Service: 03/29/2018 8:00 AM Medical Record Number: [id number] Patient Account Number: [id number] Date of Birth/Gender: 12/14/1961 (57 y.o. F) Treating RN: Curtis Sites Primary Care Physician: Joen [name] Other Clinician: Referring Physician: Joen [name] Treating Physician/Extender: Kathreen Cosier  in Treatment: 64 Education Assessment Education Provided To: Patient Education Topics Provided Wound/Skin Impairment: Handouts: Other: keep divited area clean and dry Methods: Demonstration, Explain/Verbal Responses: State content correctly Electronic Signature(s) Signed: 03/29/2018 4:24:22 PM By: Curtis Sites Entered By: Curtis Sites on 03/29/2018 08:38:30 Koo, [name] ([id number]) -------------------------------------------------------------------------------- Wound Assessment Details Patient Name: [name] Date of Service: 03/29/2018 8:00 AM Medical Record Number: [id number] Patient Account Number: [id number] Date of Birth/Sex: [birth year] (57 y.o. F) Treating RN: Curtis Sites Primary Care Callie Facey: Joen [name] Other Clinician: Referring Olive Motyka: Joen [name] Treating Latara Micheli/Extender: Kathreen Cosier in Treatment: 79 Wound Status Wound Number: 3 Primary Pressure Ulcer Etiology: Wound Location: Right Gluteal fold Wound Status: Open Wounding Event: Pressure Injury Comorbid Asthma, Hypertension, Type II Diabetes, Date Acquired: 04/21/2016 History: Neuropathy Weeks Of Treatment: 79 Clustered Wound: No Photos Photo Uploaded By: Curtis Sites on 03/29/2018 10:05:30 Wound  Measurements Length: (cm) 0.5 Width: (cm) 0.1 Depth: (cm) 1.4 Area: (cm) 0.039 Volume: (cm) 0.055 % Reduction in Area: 98.7% % Reduction in Volume: 99.7% Epithelialization: Large (67-100%) Tunneling: No Undermining: No Wound Description Classification: Category/Stage IV Wound Margin: Distinct, outline attached Exudate Amount: None Present Foul Odor After Cleansing: No Slough/Fibrino No Wound Bed Granulation Amount: None Present (0%) Exposed Structure Necrotic Amount: None Present (0%) Fascia Exposed: No Fat Layer (Subcutaneous Tissue) Exposed: Yes Tendon Exposed: No Muscle Exposed: No Joint Exposed: No Bone Exposed: No Periwound Skin Texture Texture Color No Abnormalities Noted: No No Abnormalities Noted: No Eklund, Adaijah L. ([id number]) Callus: No Atrophie Blanche: No Crepitus: No Cyanosis: No Excoriation: No Ecchymosis: No Induration: No Erythema: No Rash: No Hemosiderin Staining: No Scarring: No Mottled: No Pallor: No Moisture Rubor: No No Abnormalities Noted: No Dry / Scaly: No Temperature / Pain Maceration: No Temperature: No Abnormality Tenderness on Palpation: Yes Wound Preparation Ulcer Cleansing: Rinsed/Irrigated with Saline Topical Anesthetic Applied: None Treatment Notes Wound #3 (Right Gluteal fold) 1. Cleansed with: Clean wound with Normal Saline 3. Peri-wound Care: Skin Prep 5. Secondary Dressing Applied [place] Electronic Signature(s) Signed: 03/29/2018 4:24:22 PM By: Curtis Sites Entered By: Curtis Sites on 03/29/2018 08:19:57 Rostro, [name] ([id number]) -------------------------------------------------------------------------------- Vitals Details Patient Name: [name] Date of Service: 03/29/2018 8:00 AM Medical Record Number: [id number] Patient Account Number: [id number] Date of Birth/Sex: Aug 28, 1961 (57 y.o. F) Treating RN: Curtis Sites Primary Care Elnathan Fulford: Joen [name] Other Clinician: Referring Lydiah Pong:  Joen [name] Treating Cruise Baumgardner/Extender: Kathreen Cosier in Treatment: 33 Vital Signs Time Taken: 08:11 Temperature (F): 98.2 Height (in): 63 Pulse (bpm): 77 Weight (lbs): 257 Respiratory Rate (breaths/min): 18 Body Mass Index (BMI): 45.5 Blood Pressure (mmHg): 107/47 Reference Range: 80 - 120 mg / dl Electronic Signature(s) Signed: 03/29/2018 4:24:22 PM By: Curtis Sites Entered By: Curtis Sites on 03/29/2018 08:12:31

## 2018-04-02 ENCOUNTER — Emergency Department: Payer: BLUE CROSS/BLUE SHIELD

## 2018-04-02 ENCOUNTER — Other Ambulatory Visit: Payer: Self-pay

## 2018-04-02 ENCOUNTER — Inpatient Hospital Stay
Admission: EM | Admit: 2018-04-02 | Discharge: 2018-04-04 | DRG: 392 | Disposition: A | Discharge status: Home / Self Care | Payer: BLUE CROSS/BLUE SHIELD | Attending: Internal Medicine | Admitting: Internal Medicine

## 2018-04-02 DIAGNOSIS — I10 Essential (primary) hypertension: Secondary | ICD-10-CM | POA: Diagnosis present

## 2018-04-02 DIAGNOSIS — A084 Viral intestinal infection, unspecified: Secondary | ICD-10-CM | POA: Diagnosis present

## 2018-04-02 DIAGNOSIS — E860 Dehydration: Secondary | ICD-10-CM | POA: Diagnosis present

## 2018-04-02 DIAGNOSIS — H409 Unspecified glaucoma: Secondary | ICD-10-CM | POA: Diagnosis present

## 2018-04-02 DIAGNOSIS — M1990 Unspecified osteoarthritis, unspecified site: Secondary | ICD-10-CM | POA: Diagnosis present

## 2018-04-02 DIAGNOSIS — K219 Gastro-esophageal reflux disease without esophagitis: Secondary | ICD-10-CM | POA: Diagnosis present

## 2018-04-02 DIAGNOSIS — F17200 Nicotine dependence, unspecified, uncomplicated: Secondary | ICD-10-CM | POA: Diagnosis present

## 2018-04-02 DIAGNOSIS — E871 Hypo-osmolality and hyponatremia: Secondary | ICD-10-CM | POA: Diagnosis present

## 2018-04-02 DIAGNOSIS — R197 Diarrhea, unspecified: Secondary | ICD-10-CM

## 2018-04-02 DIAGNOSIS — R112 Nausea with vomiting, unspecified: Secondary | ICD-10-CM | POA: Diagnosis not present

## 2018-04-02 DIAGNOSIS — E1142 Type 2 diabetes mellitus with diabetic polyneuropathy: Secondary | ICD-10-CM | POA: Diagnosis present

## 2018-04-02 DIAGNOSIS — E785 Hyperlipidemia, unspecified: Secondary | ICD-10-CM | POA: Diagnosis present

## 2018-04-02 DIAGNOSIS — I959 Hypotension, unspecified: Secondary | ICD-10-CM | POA: Diagnosis present

## 2018-04-02 DIAGNOSIS — Z833 Family history of diabetes mellitus: Secondary | ICD-10-CM | POA: Diagnosis not present

## 2018-04-02 DIAGNOSIS — Z881 Allergy status to other antibiotic agents status: Secondary | ICD-10-CM

## 2018-04-02 DIAGNOSIS — Z794 Long term (current) use of insulin: Secondary | ICD-10-CM | POA: Diagnosis not present

## 2018-04-02 DIAGNOSIS — E1165 Type 2 diabetes mellitus with hyperglycemia: Secondary | ICD-10-CM | POA: Diagnosis present

## 2018-04-02 DIAGNOSIS — Z8249 Family history of ischemic heart disease and other diseases of the circulatory system: Secondary | ICD-10-CM | POA: Diagnosis not present

## 2018-04-02 DIAGNOSIS — E872 Acidosis: Secondary | ICD-10-CM | POA: Diagnosis present

## 2018-04-02 DIAGNOSIS — J811 Chronic pulmonary edema: Secondary | ICD-10-CM | POA: Diagnosis present

## 2018-04-02 DIAGNOSIS — J45909 Unspecified asthma, uncomplicated: Secondary | ICD-10-CM | POA: Diagnosis present

## 2018-04-02 DIAGNOSIS — A419 Sepsis, unspecified organism: Secondary | ICD-10-CM

## 2018-04-02 LAB — CBC WITH DIFFERENTIAL/PLATELET
BASOS ABS: 0 10*3/uL (ref 0–0.1)
Basophils Relative: 0 %
Eosinophils Absolute: 0 10*3/uL (ref 0–0.7)
Eosinophils Relative: 0 %
HEMATOCRIT: 42.2 % (ref 35.0–47.0)
HEMOGLOBIN: 13.6 g/dL (ref 12.0–16.0)
LYMPHS PCT: 2 %
Lymphs Abs: 0.2 10*3/uL — ABNORMAL LOW (ref 1.0–3.6)
MCH: 28.2 pg (ref 26.0–34.0)
MCHC: 32.3 g/dL (ref 32.0–36.0)
MCV: 87.5 fL (ref 80.0–100.0)
Monocytes Absolute: 0.4 10*3/uL (ref 0.2–0.9)
Monocytes Relative: 4 %
NEUTROS ABS: 10.5 10*3/uL — AB (ref 1.4–6.5)
NEUTROS PCT: 94 %
Platelets: 171 10*3/uL (ref 150–440)
RBC: 4.83 MIL/uL (ref 3.80–5.20)
RDW: 14.9 % — AB (ref 11.5–14.5)
WBC: 11.2 10*3/uL — ABNORMAL HIGH (ref 3.6–11.0)

## 2018-04-02 LAB — COMPREHENSIVE METABOLIC PANEL
ALBUMIN: 3.6 g/dL (ref 3.5–5.0)
ALT: 23 U/L (ref 14–54)
ANION GAP: 13 (ref 5–15)
AST: 43 U/L — ABNORMAL HIGH (ref 15–41)
Alkaline Phosphatase: 94 U/L (ref 38–126)
BILIRUBIN TOTAL: 0.7 mg/dL (ref 0.3–1.2)
BUN: 21 mg/dL — ABNORMAL HIGH (ref 6–20)
CO2: 25 mmol/L (ref 22–32)
Calcium: 9.3 mg/dL (ref 8.9–10.3)
Chloride: 92 mmol/L — ABNORMAL LOW (ref 101–111)
Creatinine, Ser: 1.26 mg/dL — ABNORMAL HIGH (ref 0.44–1.00)
GFR calc non Af Amer: 47 mL/min — ABNORMAL LOW (ref >60)
GFR, EST AFRICAN AMERICAN: 54 mL/min — AB (ref >60)
GLUCOSE: 378 mg/dL — AB (ref 65–99)
POTASSIUM: 4.7 mmol/L (ref 3.5–5.1)
SODIUM: 130 mmol/L — AB (ref 135–145)
TOTAL PROTEIN: 8.1 g/dL (ref 6.5–8.1)

## 2018-04-02 LAB — LIPASE, BLOOD: Lipase: 24 U/L (ref 11–51)

## 2018-04-02 LAB — INFLUENZA PANEL BY PCR (TYPE A & B)
INFLAPCR: NEGATIVE
INFLBPCR: NEGATIVE

## 2018-04-02 LAB — GLUCOSE, CAPILLARY: GLUCOSE-CAPILLARY: 299 mg/dL — AB (ref 65–99)

## 2018-04-02 LAB — C DIFFICILE QUICK SCREEN W PCR REFLEX
C DIFFICILE (CDIFF) INTERP: NOT DETECTED
C DIFFICILE (CDIFF) TOXIN: NEGATIVE
C DIFFICLE (CDIFF) ANTIGEN: NEGATIVE

## 2018-04-02 LAB — LACTIC ACID, PLASMA
Lactic Acid, Venous: 4.5 mmol/L (ref 0.5–1.9)
Lactic Acid, Venous: 2.5 mmol/L (ref 0.5–1.9)

## 2018-04-02 MED ORDER — DOCUSATE SODIUM 100 MG PO CAPS: 100.0000 mg | ORAL_CAPSULE | Freq: Two times a day (BID) | ORAL | Status: DC | PRN

## 2018-04-02 MED ORDER — VITAMIN C 500 MG PO TABS
1000.0000 mg | ORAL_TABLET | Freq: Every day | ORAL | Status: DC
  Administered 2018-04-03: 1000 mg via ORAL
  Filled 2018-04-02 (×2): qty 2

## 2018-04-02 MED ORDER — ACETAMINOPHEN 500 MG PO TABS
1000.0000 mg | ORAL_TABLET | Freq: Once | ORAL | Status: AC
  Administered 2018-04-02: 1000 mg via ORAL
  Filled 2018-04-02: qty 2

## 2018-04-02 MED ORDER — VANCOMYCIN HCL IN DEXTROSE 1-5 GM/200ML-% IV SOLN
1000.0000 mg | Freq: Two times a day (BID) | INTRAVENOUS | Status: DC
  Administered 2018-04-03 – 2018-04-04 (×3): 1000 mg via INTRAVENOUS
  Filled 2018-04-02 (×4): qty 200

## 2018-04-02 MED ORDER — SODIUM CHLORIDE 0.9 % IV SOLN
INTRAVENOUS | Status: DC
  Administered 2018-04-02 – 2018-04-03 (×2): via INTRAVENOUS

## 2018-04-02 MED ORDER — VANCOMYCIN HCL IN DEXTROSE 1-5 GM/200ML-% IV SOLN
1000.0000 mg | INTRAVENOUS | Status: AC
  Administered 2018-04-02: 23:00:00 1000 mg via INTRAVENOUS
  Filled 2018-04-02: qty 200

## 2018-04-02 MED ORDER — INSULIN GLARGINE 100 UNIT/ML ~~LOC~~ SOLN
20.0000 [IU] | Freq: Every day | SUBCUTANEOUS | Status: DC
  Administered 2018-04-03: 20 [IU] via SUBCUTANEOUS
  Filled 2018-04-02 (×2): qty 0.2

## 2018-04-02 MED ORDER — INSULIN ASPART 100 UNIT/ML ~~LOC~~ SOLN
0.0000 [IU] | Freq: Three times a day (TID) | SUBCUTANEOUS | Status: DC
  Administered 2018-04-03: 08:00:00 3 [IU] via SUBCUTANEOUS
  Administered 2018-04-03 (×2): 7 [IU] via SUBCUTANEOUS
  Administered 2018-04-04: 3 [IU] via SUBCUTANEOUS
  Filled 2018-04-02 (×5): qty 1

## 2018-04-02 MED ORDER — ATORVASTATIN CALCIUM 20 MG PO TABS
20.0000 mg | ORAL_TABLET | Freq: Every day | ORAL | Status: DC
  Administered 2018-04-02 – 2018-04-03 (×2): 20 mg via ORAL
  Filled 2018-04-02 (×2): qty 1

## 2018-04-02 MED ORDER — DULOXETINE HCL 30 MG PO CPEP
60.0000 mg | ORAL_CAPSULE | Freq: Every day | ORAL | Status: DC
  Administered 2018-04-03 – 2018-04-04 (×2): 60 mg via ORAL
  Filled 2018-04-02 (×2): qty 2

## 2018-04-02 MED ORDER — LATANOPROST 0.005 % OP SOLN
1.0000 [drp] | Freq: Every day | OPHTHALMIC | Status: DC
  Administered 2018-04-03: 23:00:00 1 [drp] via OPHTHALMIC
  Filled 2018-04-02: qty 2.5

## 2018-04-02 MED ORDER — SODIUM CHLORIDE 0.9 % IV SOLN
2.0000 g | Freq: Three times a day (TID) | INTRAVENOUS | Status: DC
  Administered 2018-04-03 – 2018-04-04 (×6): 2 g via INTRAVENOUS
  Filled 2018-04-02 (×7): qty 2

## 2018-04-02 MED ORDER — HEPARIN SODIUM (PORCINE) 5000 UNIT/ML IJ SOLN
5000.0000 [IU] | Freq: Three times a day (TID) | INTRAMUSCULAR | Status: DC
  Administered 2018-04-02 – 2018-04-04 (×5): 5000 [IU] via SUBCUTANEOUS
  Filled 2018-04-02 (×5): qty 1

## 2018-04-02 MED ORDER — ACETAMINOPHEN 325 MG PO TABS
650.0000 mg | ORAL_TABLET | Freq: Four times a day (QID) | ORAL | Status: DC | PRN
  Administered 2018-04-03: 650 mg via ORAL
  Filled 2018-04-02: qty 2

## 2018-04-02 MED ORDER — SODIUM CHLORIDE 0.9 % IV SOLN: 2.0000 g | INTRAVENOUS | Status: DC

## 2018-04-02 MED ORDER — SODIUM CHLORIDE 0.9 % IV BOLUS
1000.0000 mL | Freq: Once | INTRAVENOUS | Status: AC
  Administered 2018-04-02: 1000 mL via INTRAVENOUS

## 2018-04-02 MED ORDER — OXYCODONE HCL 5 MG PO TABS
5.0000 mg | ORAL_TABLET | Freq: Four times a day (QID) | ORAL | Status: DC | PRN
  Administered 2018-04-02 – 2018-04-04 (×6): 5 mg via ORAL
  Filled 2018-04-02 (×6): qty 1

## 2018-04-02 MED ORDER — OXYBUTYNIN CHLORIDE ER 10 MG PO TB24
10.0000 mg | ORAL_TABLET | Freq: Every day | ORAL | Status: DC
  Administered 2018-04-03: 10 mg via ORAL
  Filled 2018-04-02 (×3): qty 1

## 2018-04-02 MED ORDER — OXYCODONE-ACETAMINOPHEN 10-325 MG PO TABS: 1.0000 | ORAL_TABLET | Freq: Four times a day (QID) | ORAL | Status: DC | PRN

## 2018-04-02 MED ORDER — OXYCODONE-ACETAMINOPHEN 5-325 MG PO TABS
1.0000 | ORAL_TABLET | Freq: Four times a day (QID) | ORAL | Status: DC | PRN
  Administered 2018-04-02 – 2018-04-04 (×6): 1 via ORAL
  Filled 2018-04-02 (×6): qty 1

## 2018-04-02 MED ORDER — VITAMIN D 1000 UNITS PO TABS
5000.0000 [IU] | ORAL_TABLET | Freq: Every day | ORAL | Status: DC
  Administered 2018-04-03 – 2018-04-04 (×2): 5000 [IU] via ORAL
  Filled 2018-04-02 (×2): qty 5

## 2018-04-02 MED ORDER — PANTOPRAZOLE SODIUM 40 MG PO PACK
20.0000 mg | PACK | Freq: Every day | ORAL | Status: DC
  Administered 2018-04-03 – 2018-04-04 (×2): 20 mg via ORAL
  Filled 2018-04-02 (×2): qty 20

## 2018-04-02 MED ORDER — GABAPENTIN 600 MG PO TABS
1200.0000 mg | ORAL_TABLET | Freq: Three times a day (TID) | ORAL | Status: DC
  Administered 2018-04-02 – 2018-04-04 (×5): 1200 mg via ORAL
  Filled 2018-04-02 (×5): qty 2

## 2018-04-02 MED ORDER — CALCIUM-VITAMIN D 500-200 MG-UNIT PO TABS
ORAL_TABLET | Freq: Two times a day (BID) | ORAL | Status: DC
  Administered 2018-04-03 (×2): 1 via ORAL
  Filled 2018-04-02 (×5): qty 1

## 2018-04-02 MED ORDER — PIPERACILLIN-TAZOBACTAM 3.375 G IVPB 30 MIN
3.3750 g | Freq: Once | INTRAVENOUS | Status: AC
  Administered 2018-04-02: 3.375 g via INTRAVENOUS
  Filled 2018-04-02: qty 50

## 2018-04-02 NOTE — ED Notes (Signed)
MD stated to hold off on drawing blood cultures at this time

## 2018-04-02 NOTE — ED Provider Notes (Signed)
[facility] Emergency Department Provider Note  ____________________________________________  Time seen: Approximately 5:10 PM  I have reviewed the triage vital signs and the nursing notes.   HISTORY  Chief Complaint Nausea; Emesis; and Diarrhea   HPI [name] is a 57 y.o. female with a history of diabetes, GERD, hypertension, hyperlipidemia, asthma who presents for evaluation of fever, vomiting, diarrhea.  Her symptoms started today.  Patient with subjective fevers at home.  4 episodes of watery diarrhea and 4 episodes of nonbloody nonbilious emesis.  No known sick contact exposures. Symptoms are ongoing since this am. No prior history of C. difficile or recent antibiotic use.  Patient denies cough, congestion, shortness of breath, chest pain, abdominal pain, dysuria or hematuria.  She is complaining of pain in her bilateral feet which is her baseline from neuropathy. No melena, hematemesis, coffee-ground emesis, or hematochezia.   Past Medical History:  Diagnosis Date  . Acute pain of right shoulder 05/19/2016  . Acute PN (pyelonephritis) 05/18/2016  . Acute pyelonephritis 05/18/2016  . Anxiety   . Arthritis   . Asthma   . Diabetes mellitus without complication (HCC)   . GERD (gastroesophageal reflux disease)   . Glaucoma   . Hyperlipemia   . Hyperlipemia   . Hypertension     Patient Active Problem List   Diagnosis Date Noted  . 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 CKD (chronic kidney disease) stage 3, GFR 30-59 ml/min (HCC) 07/04/2017  . Class 3 drug-induced obesity with serious comorbidity and body mass index (BMI) of 45.0 to 49.9 in adult (HCC) 07/04/2017  . Chronic feet pain (Location of Primary Source of Pain) (Bilateral) (R>L) 04/25/2017  . Diabetic peripheral neuropathy (HCC) (Location of Primary Source of Pain) (Bilateral) (R>L) 04/25/2017  . Chronic lower extremity pain (Location of Secondary source of pain) (Bilateral) (R>L)  04/25/2017  . Chronic pain syndrome 04/25/2017  . Long term (current) use of opiate analgesic 04/25/2017  . Long term prescription opiate use 04/25/2017  . Opiate use 04/25/2017  . Chronic hand pain (Location of Tertiary source of pain) (Bilateral) (L>R) 04/25/2017  . Carpal tunnel syndrome (Bilateral) (L>R) 04/25/2017  . History of stroke 04/25/2017  . Infected decubitus ulcer, unstageable (HCC) 10/26/2016  . Closed fracture of humerus, surgical neck 05/31/2016  . Closed 3-part fracture of surgical neck of right humerus with delayed healing 05/31/2016  . Pain in shoulder 05/19/2016  . Edema leg 05/18/2016  . 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 Kidney lump 05/18/2016  . Bilateral edema of lower extremity 05/18/2016  . Bilateral leg weakness 05/18/2016  . Kidney mass 05/18/2016  . Pressure ulcer 04/24/2016  . History of DKA (diabetic ketoacidosis) (HCC) 04/23/2016    Class: History of  . Diabetes mellitus (HCC) 04/23/2016  . Other specified diabetes mellitus with ketoacidosis without coma (HCC) 04/23/2016    Past Surgical History:  Procedure Laterality Date  . ANTERIOR CRUCIATE LIGAMENT REPAIR      Prior to Admission medications   Medication Sig Start Date End Date Taking? Authorizing Provider  Ascorbic Acid (VITAMIN C) 1000 MG tablet Take 1,000 mg by mouth daily.   Yes [provider]  atorvastatin (LIPITOR) 20 MG tablet Take 20 mg by mouth at bedtime. 04/16/16  Yes [provider]  Calcium Carbonate-Vitamin D3 (CALCIUM 600-D) 600-400 MG-UNIT TABS Take by mouth 2 (two) times daily.   Yes [provider]  cholecalciferol (VITAMIN D) 1000 units tablet Take 5,000 Units by mouth daily.   Yes  [provider]  DULoxetine (CYMBALTA) 20 MG capsule Take 60 mg by mouth daily.    Yes [provider]  furosemide (LASIX) 20 MG tablet Take 1 tablet (20 mg total) by mouth daily. 05/02/16  Yes Gouru, Deanna Artis, MD  gabapentin (NEURONTIN) 600 MG tablet Take  1,200 mg by mouth 3 (three) times daily.    Yes [provider]  insulin glargine (LANTUS) 100 UNIT/ML injection Inject 70 Units into the skin daily.   Yes [provider]  insulin lispro (HUMALOG) 100 UNIT/ML injection Inject 15 Units into the skin 3 (three) times daily before meals.    Yes [provider]  latanoprost (XALATAN) 0.005 % ophthalmic solution Place 1 drop into both eyes at bedtime. 05/02/16  Yes Gouru, Deanna Artis, MD  metFORMIN (GLUCOPHAGE-XR) 500 MG 24 hr tablet Take 1 tablet by mouth 2 (two) times daily. 01/11/18  Yes [provider]  metoprolol tartrate (LOPRESSOR) 25 MG tablet Take 1 tablet (25 mg total) by mouth 2 (two) times daily. 05/02/16  Yes Gouru, Deanna Artis, MD  oxybutynin (DITROPAN-XL) 10 MG 24 hr tablet Take 10 mg by mouth at bedtime. 02/22/16  Yes [provider]  oxyCODONE-acetaminophen (PERCOCET) 10-325 MG tablet Take 1 tablet by mouth every 6 (six) hours as needed for pain.   Yes [provider]  pantoprazole (PROTONIX) 20 MG tablet Take 20 mg by mouth daily.   Yes [provider]  traZODone (DESYREL) 50 MG tablet Take 1 tablet (50 mg total) by mouth at bedtime. Patient taking differently: Take 200 mg by mouth at bedtime.  05/02/16  Yes Ramonita Lab, MD    Allergies Biaxin [clarithromycin]  Family History  Problem Relation Age of Onset  . Diabetes Mother   . Heart disease Mother   . Stroke Mother   . Kidney cancer Neg Hx   . Prostate cancer Neg Hx     Social History Social History   Tobacco Use  . Smoking status: Current Every Day Smoker    Packs/day: 0.50  . Smokeless tobacco: Never Used  Substance Use Topics  . Alcohol use: No  . Drug use: Never    Review of Systems  Constitutional: + fever. Eyes: Negative for visual changes. ENT: Negative for sore throat. Neck: No neck pain  Cardiovascular: Negative for chest pain. Respiratory: Negative for shortness of breath. Gastrointestinal: Negative for  abdominal pain. + vomiting and diarrhea. Genitourinary: Negative for dysuria. Musculoskeletal: Negative for back pain. + b/l feet pain Skin: Negative for rash. Neurological: Negative for headaches, weakness or numbness. Psych: No SI or HI  ____________________________________________   PHYSICAL EXAM:  VITAL SIGNS: ED Triage Vitals  Enc Vitals Group     BP 04/02/18 1656 117/65     Pulse Rate 04/02/18 1656 (!) 115     Resp 04/02/18 1656 20     Temp 04/02/18 1656 (!) 103 F (39.4 C)     Temp Source 04/02/18 1656 Oral     SpO2 04/02/18 1653 93 %     Weight 04/02/18 1657 284 lb (128.8 kg)     Height 04/02/18 1657 5\' 3"  (1.6 m)     Head Circumference --      Peak Flow --      Pain Score 04/02/18 1657 8     Pain Loc --      Pain Edu? --      Excl. in GC? --     Constitutional: Alert and oriented. Well appearing and in no apparent distress.  HEENT:      Head: Normocephalic and atraumatic.         Eyes: Conjunctivae are normal. Sclera is non-icteric.       Mouth/Throat: Mucous membranes are moist.       Neck: Supple with no signs of meningismus. Cardiovascular: Tachycardic with regular rhythm. No murmurs, gallops, or rubs. 2+ symmetrical distal pulses are present in all extremities. No JVD. Respiratory: Normal respiratory effort, hypoxic, lungs CTAB Gastrointestinal: Obese, non tender, and non distended with positive bowel sounds. No rebound or guarding. Musculoskeletal: Nontender with normal range of motion in all extremities. No edema, cyanosis, or erythema of extremities. Neurologic: Normal speech and language. Face is symmetric. Moving all extremities. No gross focal neurologic deficits are appreciated. Skin: Skin is warm, dry and intact. No rash noted. Psychiatric: Mood and affect are normal. Speech and behavior are normal.  ____________________________________________   LABS (all labs ordered are listed, but only abnormal results are displayed)  Labs Reviewed    COMPREHENSIVE METABOLIC PANEL - Abnormal; Notable for the following components:      Result Value   Sodium 130 (*)    Chloride 92 (*)    Glucose, Bld 378 (*)    BUN 21 (*)    Creatinine, Ser 1.26 (*)    AST 43 (*)    GFR calc non Af Amer 47 (*)    GFR calc Af Amer 54 (*)    All other components within normal limits  CBC WITH DIFFERENTIAL/PLATELET - Abnormal; Notable for the following components:   WBC 11.2 (*)    RDW 14.9 (*)    Neutro Abs 10.5 (*)    Lymphs Abs 0.2 (*)    All other components within normal limits  LACTIC ACID, PLASMA - Abnormal; Notable for the following components:   Lactic Acid, Venous 4.5 (*)    All other components within normal limits  CULTURE, BLOOD (ROUTINE X 2)  CULTURE, BLOOD (ROUTINE X 2)  C DIFFICILE QUICK SCREEN W PCR REFLEX  LIPASE, BLOOD  INFLUENZA PANEL BY PCR (TYPE A & B)  URINALYSIS, COMPLETE (UACMP) WITH MICROSCOPIC  CBG MONITORING, ED   ____________________________________________  EKG  ED ECG REPORT I, Nita Sickle, the attending physician, personally viewed and interpreted this ECG.  Sinus tachycardia, rate of 112, normal intervals, right axis deviation, Q waves in inferior and anterior leads, no ST elevations or depressions. No prior for comparison ____________________________________________  RADIOLOGY  I have personally reviewed the images performed during this visit and I agree with the Radiologist's read.   Interpretation by Radiologist:  Dg Chest 2 View  Result Date: 04/02/2018 CLINICAL DATA:  Nausea vomiting and diarrhea EXAM: CHEST - 2 VIEW COMPARISON:  11/02/2016 FINDINGS: Cardiomegaly. No focal opacity or significant pleural effusion. Mild vascular congestion. Aortic atherosclerosis. No pneumothorax. Old fracture deformity of the proximal right humerus. IMPRESSION: 1. Cardiomegaly with vascular congestion Electronically Signed   By: Jasmine Pang M.D.   On: 04/02/2018 17:58       ____________________________________________   PROCEDURES  Procedure(s) performed: None Procedures Critical Care performed: yes  CRITICAL CARE Performed by: Nita Sickle  ?  Total critical care time: 40 min  Critical care time was exclusive of separately billable procedures and treating other patients.  Critical care was necessary to treat or prevent imminent or life-threatening deterioration.  Critical care was time spent personally by me on the following activities: development of treatment plan with patient and/or surrogate as well as nursing, discussions with consultants, evaluation of  patient's response to treatment, examination of patient, obtaining history from patient or surrogate, ordering and performing treatments and interventions, ordering and review of laboratory studies, ordering and review of radiographic studies, pulse oximetry and re-evaluation of patient's condition.  ____________________________________________   INITIAL IMPRESSION / ASSESSMENT AND PLAN / ED COURSE   57 y.o. female with a history of diabetes, GERD, hypertension, hyperlipidemia, asthma who presents for evaluation of fever, vomiting, diarrhea since this am.  Patient arrives with a fever of 103, she is hypoxic to 89% on room air, clear lungs, she is tachycardic with a heart rate of 115.  Abdomen is soft and nontender.  EKG showing sinus tachycardia with no evidence of acute ischemia. Ddx flu vs viral URI vs C. Diff. Plan for labs, lactic, CXR. Will give IVF and tyelnol for fever. Will check Flu and C. diff    _________________________ 6:30 PM on 04/02/2018 -----------------------------------------  Presentation concerning for sepsis with fever, tachycardia, leukocytosis, no lactic acid of 4.5.  Patient given fluids and Zosyn.  Flu is negative.  C. difficile is pending.  Patient remains with no abdominal pain or tenderness therefore CT was held at this time.  Will admit to the hospitalist  service.   As part of my medical decision making, I reviewed the following data within the electronic MEDICAL RECORD NUMBER Nursing notes reviewed and incorporated, Labs reviewed , EKG interpreted , Radiograph reviewed , Discussed with admitting physician , Notes from prior ED visits and [place] Controlled Substance Database    Pertinent labs & imaging results that were available during my care of the patient were reviewed by me and considered in my medical decision making (see chart for details).    ____________________________________________   FINAL CLINICAL IMPRESSION(S) / ED DIAGNOSES  Final diagnoses:  Sepsis, due to unspecified organism (HCC)  Nausea vomiting and diarrhea      NEW MEDICATIONS STARTED DURING THIS VISIT:  ED Discharge Orders    None       Note:  This document was prepared using Dragon voice recognition software and may include unintentional dictation errors.    Don Perking, [place], MD 04/02/18 [phone]

## 2018-04-02 NOTE — ED Notes (Signed)
Pt assisted on bedpan to void 

## 2018-04-02 NOTE — ED Triage Notes (Signed)
Pt arrived via ems for c/o nausea/vomiting/diarrhea x1 day (vomited x4, loose stools x4)

## 2018-04-02 NOTE — ED Notes (Signed)
CODE SEPSIS 

## 2018-04-02 NOTE — H&P (Signed)
[facility] [place] [facility]   PATIENT NAME: [name]    MR#:  [id number]  DATE OF BIRTH:  July 18, 1961  DATE OF ADMISSION:  04/02/2018  PRIMARY CARE PHYSICIAN: Dortha Kern, MD   REQUESTING/REFERRING PHYSICIAN: Don Perking  CHIEF COMPLAINT:   Chief Complaint  Patient presents with  . Nausea  . Emesis  . Diarrhea    HISTORY OF PRESENT ILLNESS: [name] is a 57 y.o. female with a known history of anxiety, asthma, diabetes, gastroesophageal reflux disease, hyperlipidemia, hypertension- had diarrhea and vomiting for last 2 days, also felt swelling on her legs and some shortness of breath. Could not eat anything and did not take her medicine today, noted her blood sugar level is running high so came to emergency room.she also had fever and chills. She was noted to be septic with hypotension, tachycardia, elevated white blood cell, lactic acidosis- ER physician gave broad-spectrum antibiotics, chest x-ray reported some pulmonary edema, UA is not sent yet.  PAST MEDICAL HISTORY:   Past Medical History:  Diagnosis Date  . Acute pain of right shoulder 05/19/2016  . Acute PN (pyelonephritis) 05/18/2016  . Acute pyelonephritis 05/18/2016  . Anxiety   . Arthritis   . Asthma   . Diabetes mellitus without complication (HCC)   . GERD (gastroesophageal reflux disease)   . Glaucoma   . Hyperlipemia   . Hyperlipemia   . Hypertension     PAST SURGICAL HISTORY:  Past Surgical History:  Procedure Laterality Date  . ANTERIOR CRUCIATE LIGAMENT REPAIR      SOCIAL HISTORY:  Social History   Tobacco Use  . Smoking status: Current Every Day Smoker    Packs/day: 0.50  . Smokeless tobacco: Never Used  Substance Use Topics  . Alcohol use: No    FAMILY HISTORY:  Family History  Problem Relation Age of Onset  . Diabetes Mother   . Heart disease Mother   . Stroke Mother   . Kidney cancer Neg Hx   . Prostate cancer Neg Hx     DRUG ALLERGIES:  Allergies   Allergen Reactions  . Biaxin [Clarithromycin] Rash    Patient states this medication gives her severe rash and thrush in the mouth.    REVIEW OF SYSTEMS:   CONSTITUTIONAL: Had fever,no fatigue or weakness.  EYES: No blurred or double vision.  EARS, NOSE, AND THROAT: No tinnitus or ear pain.  RESPIRATORY: No cough, shortness of breath, wheezing or hemoptysis.  CARDIOVASCULAR: No chest pain, orthopnea, edema.  GASTROINTESTINAL: No nausea, vomiting, diarrhea or abdominal pain.  GENITOURINARY: No dysuria, hematuria.  ENDOCRINE: No polyuria, nocturia,  HEMATOLOGY: No anemia, easy bruising or bleeding. SKIN: No rash or lesion. MUSCULOSKELETAL: No joint pain or arthritis.   NEUROLOGIC: No tingling, numbness, weakness.  PSYCHIATRY: No anxiety or depression.   MEDICATIONS AT HOME:  Prior to Admission medications   Medication Sig Start Date End Date Taking? Authorizing Provider  Ascorbic Acid (VITAMIN C) 1000 MG tablet Take 1,000 mg by mouth daily.   Yes [provider]  atorvastatin (LIPITOR) 20 MG tablet Take 20 mg by mouth at bedtime. 04/16/16  Yes [provider]  Calcium Carbonate-Vitamin D3 (CALCIUM 600-D) 600-400 MG-UNIT TABS Take by mouth 2 (two) times daily.   Yes [provider]  cholecalciferol (VITAMIN D) 1000 units tablet Take 5,000 Units by mouth daily.   Yes [provider]  DULoxetine (CYMBALTA) 20 MG capsule Take 60 mg by mouth daily.    Yes [provider]  furosemide (LASIX) 20 MG tablet Take 1 tablet (20 mg total) by mouth daily. 05/02/16  Yes Gouru, Deanna Artis, MD  gabapentin (NEURONTIN) 600 MG tablet Take 1,200 mg by mouth 3 (three) times daily.    Yes [provider]  insulin glargine (LANTUS) 100 UNIT/ML injection Inject 70 Units into the skin daily.   Yes [provider]  insulin lispro (HUMALOG) 100 UNIT/ML injection Inject 15 Units into the skin 3 (three) times daily before meals.    Yes [provider]   latanoprost (XALATAN) 0.005 % ophthalmic solution Place 1 drop into both eyes at bedtime. 05/02/16  Yes Gouru, Deanna Artis, MD  metFORMIN (GLUCOPHAGE-XR) 500 MG 24 hr tablet Take 1 tablet by mouth 2 (two) times daily. 01/11/18  Yes [provider]  metoprolol tartrate (LOPRESSOR) 25 MG tablet Take 1 tablet (25 mg total) by mouth 2 (two) times daily. 05/02/16  Yes Gouru, Deanna Artis, MD  oxybutynin (DITROPAN-XL) 10 MG 24 hr tablet Take 10 mg by mouth at bedtime. 02/22/16  Yes [provider]  oxyCODONE-acetaminophen (PERCOCET) 10-325 MG tablet Take 1 tablet by mouth every 6 (six) hours as needed for pain.   Yes [provider]  pantoprazole (PROTONIX) 20 MG tablet Take 20 mg by mouth daily.   Yes [provider]  traZODone (DESYREL) 50 MG tablet Take 1 tablet (50 mg total) by mouth at bedtime. Patient taking differently: Take 200 mg by mouth at bedtime.  05/02/16  Yes Gouru, Aruna, MD      PHYSICAL EXAMINATION:   VITAL SIGNS: Blood pressure (!) 90/53, pulse (!) 116, temperature (!) 103 F (39.4 C), temperature source Oral, resp. rate (!) 23, height 5\' 3"  (1.6 m), weight 128.8 kg (284 lb), SpO2 92 %.  GENERAL:  57 y.o.-year-old patient lying in the bed with no acute distress.  EYES: Pupils equal, round, reactive to light and accommodation. No scleral icterus. Extraocular muscles intact.  HEENT: Head atraumatic, normocephalic. Oropharynx and nasopharynx clear.  NECK:  Supple, no jugular venous distention. No thyroid enlargement, no tenderness.  LUNGS: Normal breath sounds bilaterally, no wheezing, some crepitation. No use of accessory muscles of respiration.  CARDIOVASCULAR: S1, S2 fast tachycardia. No murmurs, rubs, or gallops.  ABDOMEN: Soft, nontender, nondistended. Bowel sounds present. No organomegaly or mass.  EXTREMITIES: some pedal edema,no cyanosis, or clubbing.  NEUROLOGIC: Cranial nerves II through XII are intact. Muscle strength 4- 5 / 5 in all extremities.  Sensation intact. Gait not checked.  PSYCHIATRIC: The patient is alert and oriented x 3.  SKIN: No obvious rash, lesion, or ulcer.   LABORATORY PANEL:   CBC Recent Labs  Lab 04/02/18 1705  WBC 11.2*  HGB 13.6  HCT 42.2  PLT 171  MCV 87.5  MCH 28.2  MCHC 32.3  RDW 14.9*  LYMPHSABS 0.2*  MONOABS 0.4  EOSABS 0.0  BASOSABS 0.0   ------------------------------------------------------------------------------------------------------------------  Chemistries  Recent Labs  Lab 04/02/18 1659  NA 130*  K 4.7  CL 92*  CO2 25  GLUCOSE 378*  BUN 21*  CREATININE 1.26*  CALCIUM 9.3  AST 43*  ALT 23  ALKPHOS 94  BILITOT 0.7   ------------------------------------------------------------------------------------------------------------------ estimated creatinine clearance is 65.3 mL/min (A) (by C-G formula based on SCr of 1.26 mg/dL (H)). ------------------------------------------------------------------------------------------------------------------ No results for input(s): TSH, T4TOTAL, T3FREE, THYROIDAB in the last 72 hours.  Invalid input(s): FREET3   Coagulation profile No results for input(s): INR, PROTIME in the last 168 hours. ------------------------------------------------------------------------------------------------------------------- No results for input(s): DDIMER  in the last 72 hours. -------------------------------------------------------------------------------------------------------------------  Cardiac Enzymes No results for input(s): CKMB, TROPONINI, MYOGLOBIN in the last 168 hours.  Invalid input(s): CK ------------------------------------------------------------------------------------------------------------------ Invalid input(s): POCBNP  ---------------------------------------------------------------------------------------------------------------  Urinalysis    Component Value Date/Time   COLORURINE YELLOW (A) 04/23/2016 1641    APPEARANCEUR Hazy (A) 06/27/2016 1401   LABSPEC 1.016 04/23/2016 1641   PHURINE 5.0 04/23/2016 1641   GLUCOSEU Negative 06/27/2016 1401   HGBUR 3+ (A) 04/23/2016 1641   BILIRUBINUR Negative 06/27/2016 1401   KETONESUR TRACE (A) 04/23/2016 1641   PROTEINUR Negative 06/27/2016 1401   PROTEINUR 30 (A) 04/23/2016 1641   NITRITE Negative 06/27/2016 1401   NITRITE NEGATIVE 04/23/2016 1641   LEUKOCYTESUR 1+ (A) 06/27/2016 1401     RADIOLOGY: Dg Chest 2 View  Result Date: 04/02/2018 CLINICAL DATA:  Nausea vomiting and diarrhea EXAM: CHEST - 2 VIEW COMPARISON:  11/02/2016 FINDINGS: Cardiomegaly. No focal opacity or significant pleural effusion. Mild vascular congestion. Aortic atherosclerosis. No pneumothorax. Old fracture deformity of the proximal right humerus. IMPRESSION: 1. Cardiomegaly with vascular congestion Electronically Signed   By: Jasmine Pang M.D.   On: 04/02/2018 17:58    EKG: Orders placed or performed during the hospital encounter of 04/02/18  . ED EKG  . ED EKG  . EKG 12-Lead  . EKG 12-Lead    IMPRESSION AND PLAN:  * sepsis   Unknown etiology currently, UA still pending   Other possibility is gastroenteritis.   We will give gentle IV hydration as patient has some evidence of pulmonary edema but she is running hypotensive so I would prefer to give IV fluid at this time and watch for further fluid overload.   Broad-spectrum antibiotics, blood culture is sent.   Lactic acidosis, follow up further lactic acid.  * hyperglycemia   As patient did not take her insulin today.   Given low-dose Lantus and keep on sliding scale coverage.  * glaucoma   Continue eye drops  * hypertension   Hold antihypertensive medication at this time because of hypotension.  * diarrhea and vomiting    Check for GI panel, C. Difficile is negative.  * pulmonary edema   Patient has some evidence of pulmonary edema and leg edema, I would check echocardiogram, currently because of sepsis and  hypotension giving gentle IV hydration once blood pressure stable in sepsis resolves we need to reassess the fluid status.  * active smoking   Counseled to quit smoking for 4 minutes and offered nicotine patch will  All the records are reviewed and case discussed with ED provider. Management plans discussed with the patient, family and they are in agreement.  CODE STATUS: Full. Code Status History    Date Active Date Inactive Code Status Order ID Comments User Context   04/23/2016 1834 05/02/2016 2018 Full Code [id number]  Katha Hamming, MD ED       TOTAL TIME TAKING CARE OF THIS PATIENT: 45  minutes.    Altamese Dilling M.D on 04/02/2018   Between 7am to 6pm - Pager - [phone]  After 6pm go to [email or web address] - password [email or web address]  [facility]  Office  [phone]  CC: Primary care physician; Dortha Kern, MD   Note: This dictation was prepared with Dragon dictation along with smaller phrase technology. Any transcriptional errors that result from this process are unintentional.

## 2018-04-02 NOTE — Progress Notes (Signed)
Pharmacy Antibiotic Note  [name] is a 57 y.o. female admitted on 04/02/2018 with sepsis.  Pharmacy has been consulted for vanc/cefepime dosing.  Plan: Patient received vanc 1g and zosyn 3.375g IV x 1  Will continue vanc 1g IV q12h w/ 6 hour stack Will draw vanc trough 04/10 @ 1500 prior to 4th dose. Will start cefepime 2g IV q8h  Ke 0.0591 T1/2 12 hrs Goal trough 15 - 20 mcg/mL  Height: 5\' 3"  (160 cm) Weight: 288 lb 12.8 oz (131 kg) IBW/kg (Calculated) : 52.4  Temp (24hrs), Avg:101.8 F (38.8 C), Min:100.5 F (38.1 C), Max:103 F (39.4 C)  Recent Labs  Lab 04/02/18 1659 04/02/18 1705 04/02/18 2119  WBC  --  11.2*  --   CREATININE 1.26*  --   --   LATICACIDVEN  --  4.5* 2.5*    Estimated Creatinine Clearance: 66 mL/min (A) (by C-G formula based on SCr of 1.26 mg/dL (H)).    Allergies  Allergen Reactions  . Biaxin [Clarithromycin] Rash    Patient states this medication gives her severe rash and thrush in the mouth.   Thank you for allowing pharmacy to be a part of this patient's care.  2120, PharmD, BCPS Clinical Pharmacist 04/02/2018

## 2018-04-03 ENCOUNTER — Inpatient Hospital Stay
Admit: 2018-04-03 | Discharge: 2018-04-03 | Disposition: A | Discharge status: Home / Self Care | Payer: BLUE CROSS/BLUE SHIELD | Attending: Internal Medicine | Admitting: Internal Medicine

## 2018-04-03 LAB — ECHOCARDIOGRAM COMPLETE
Height: 63 in
WEIGHTICAEL: 4620.8 [oz_av]

## 2018-04-03 LAB — CBC
HEMATOCRIT: 36.5 % (ref 35.0–47.0)
HEMOGLOBIN: 11.9 g/dL — AB (ref 12.0–16.0)
MCH: 28.2 pg (ref 26.0–34.0)
MCHC: 32.6 g/dL (ref 32.0–36.0)
MCV: 86.7 fL (ref 80.0–100.0)
Platelets: 145 10*3/uL — ABNORMAL LOW (ref 150–440)
RBC: 4.21 MIL/uL (ref 3.80–5.20)
RDW: 14.9 % — ABNORMAL HIGH (ref 11.5–14.5)
WBC: 6.5 10*3/uL (ref 3.6–11.0)

## 2018-04-03 LAB — GLUCOSE, CAPILLARY
GLUCOSE-CAPILLARY: 249 mg/dL — AB (ref 65–99)
GLUCOSE-CAPILLARY: 302 mg/dL — AB (ref 65–99)
GLUCOSE-CAPILLARY: 310 mg/dL — AB (ref 65–99)
GLUCOSE-CAPILLARY: 253 mg/dL — AB (ref 65–99)

## 2018-04-03 LAB — GASTROINTESTINAL PANEL BY PCR, STOOL (REPLACES STOOL CULTURE)

## 2018-04-03 LAB — URINALYSIS, COMPLETE (UACMP) WITH MICROSCOPIC
BILIRUBIN URINE: NEGATIVE
Glucose, UA: >=500 mg/dL — AB
KETONES UR: NEGATIVE mg/dL
Nitrite: NEGATIVE
Protein, ur: >=300 mg/dL — AB
SPECIFIC GRAVITY, URINE: 1.017 (ref 1.005–1.030)
pH: 5 (ref 5.0–8.0)

## 2018-04-03 LAB — BASIC METABOLIC PANEL
ANION GAP: 6 (ref 5–15)
BUN: 25 mg/dL — ABNORMAL HIGH (ref 6–20)
CO2: 27 mmol/L (ref 22–32)
Calcium: 8.2 mg/dL — ABNORMAL LOW (ref 8.9–10.3)
Chloride: 97 mmol/L — ABNORMAL LOW (ref 101–111)
Creatinine, Ser: 1.33 mg/dL — ABNORMAL HIGH (ref 0.44–1.00)
GFR, EST AFRICAN AMERICAN: 51 mL/min — AB (ref >60)
GFR, EST NON AFRICAN AMERICAN: 44 mL/min — AB (ref >60)
Glucose, Bld: 267 mg/dL — ABNORMAL HIGH (ref 65–99)
POTASSIUM: 4.3 mmol/L (ref 3.5–5.1)
Sodium: 130 mmol/L — ABNORMAL LOW (ref 135–145)

## 2018-04-03 LAB — MRSA PCR SCREENING: MRSA by PCR: NEGATIVE

## 2018-04-03 MED ORDER — METOPROLOL TARTRATE 25 MG PO TABS
25.0000 mg | ORAL_TABLET | Freq: Two times a day (BID) | ORAL | Status: DC
  Administered 2018-04-03 – 2018-04-04 (×3): 25 mg via ORAL
  Filled 2018-04-03 (×3): qty 1

## 2018-04-03 NOTE — Progress Notes (Signed)
[facility] - [place] at [facility]                                                                                                                                                                                  Patient Demographics   [name], is a 57 y.o. female, DOB - [birth year], UVO:[id number]  Admit date - 04/02/2018   Admitting Physician Altamese Dilling, MD  Outpatient Primary MD for the patient is Dortha Kern, MD   LOS - 1  Subjective: Patient seen and evaluated today No new episodes of diarrhea Has some nausea but no vomiting  Has generalized weakness  no fevers  Review of Systems:   CONSTITUTIONAL: No documented fever. Has fatigue, weakness. No weight gain, no weight loss.  EYES: No blurry or double vision.  ENT: No tinnitus. No postnasal drip. No redness of the oropharynx.  RESPIRATORY: No cough, no wheeze, no hemoptysis. No dyspnea.  CARDIOVASCULAR: No chest pain. No orthopnea. No palpitations. No syncope.  GASTROINTESTINAL: Decreased nausea, no vomiting, diarrhea. No abdominal pain. No melena or hematochezia.  GENITOURINARY: No dysuria or hematuria.  ENDOCRINE: No polyuria or nocturia. No heat or cold intolerance.  HEMATOLOGY: No anemia. No bruising. No bleeding.  INTEGUMENTARY: No rashes. No lesions.  MUSCULOSKELETAL: No arthritis. No swelling. No gout.  NEUROLOGIC: No numbness, tingling, or ataxia. No seizure-type activity.  PSYCHIATRIC: No anxiety. No insomnia. No ADD.    Vitals:   Vitals:   04/02/18 2200 04/02/18 2242 04/03/18 0434 04/03/18 0626  BP: 98/68 (!) 143/80 (!) 142/74   Pulse: (!) 110 (!) 114 (!) 114   Resp: 15 20 20    Temp:  (!) 100.5 F (38.1 C) (!) 102 F (38.9 C) 99.5 F (37.5 C)  TempSrc:  Oral Oral Oral  SpO2: 98% 98% 92%   Weight:  131 kg (288 lb 12.8 oz)    Height:  5\' 3"  (1.6 m)      Wt Readings from Last 3 Encounters:  04/02/18 131 kg (288 lb 12.8 oz)  08/15/17 118.4 kg (261 lb)  08/07/17 118.4 kg  (261 lb)     Intake/Output Summary (Last 24 hours) at 04/03/2018 1202 Last data filed at 04/03/2018 0943 Gross per 24 hour  Intake 2889.17 ml  Output -  Net 2889.17 ml    Physical Exam:   GENERAL: Middle aged female patient in no apparent distress.  HEAD, EYES, EARS, NOSE AND THROAT: Atraumatic, normocephalic. Extraocular muscles are intact. Pupils equal and reactive to light. Sclerae anicteric. No conjunctival injection. No oro-pharyngeal erythema.  NECK: Supple. There is no jugular venous distention. No bruits, no lymphadenopathy, no thyromegaly.  HEART: Regular rate and rhythm,.  No murmurs, no rubs, no clicks.  LUNGS: Clear to auscultation bilaterally. No rales or rhonchi. No wheezes.  ABDOMEN: Soft, flat, nontender, nondistended. Has good bowel sounds. No hepatosplenomegaly appreciated.  EXTREMITIES: No evidence of any cyanosis, clubbing, or peripheral edema.  +2 pedal and radial pulses bilaterally. Pedal edema NEUROLOGIC: The patient is alert, awake, and oriented x3 with no focal motor or sensory deficits appreciated bilaterally.  SKIN: Moist and warm with no rashes appreciated.  Psych: Not anxious, depressed LN: No inguinal LN enlargement    Antibiotics   Anti-infectives (From admission, onward)   Start     Dose/Rate Route Frequency Ordered Stop   04/03/18 0400  vancomycin (VANCOCIN) IVPB 1000 mg/200 mL premix     1,000 mg 200 mL/hr over 60 Minutes Intravenous Every 12 hours 04/02/18 2121     04/02/18 2345  ceFEPIme (MAXIPIME) 2 g in sodium chloride 0.9 % 100 mL IVPB     2 g 200 mL/hr over 30 Minutes Intravenous Every 8 hours 04/02/18 2333     04/02/18 2130  vancomycin (VANCOCIN) IVPB 1000 mg/200 mL premix     1,000 mg 200 mL/hr over 60 Minutes Intravenous STAT 04/02/18 2121 04/03/18 0027   04/02/18 2130  ceFEPIme (MAXIPIME) 2 g in sodium chloride 0.9 % 100 mL IVPB  Status:  Discontinued     2 g 200 mL/hr over 30 Minutes Intravenous STAT 04/02/18 2121 04/02/18 2333    04/02/18 1815  piperacillin-tazobactam (ZOSYN) IVPB 3.375 g     3.375 g 100 mL/hr over 30 Minutes Intravenous  Once 04/02/18 1801 04/02/18 1956      Medications   Scheduled Meds: . atorvastatin  20 mg Oral QHS  . calcium-vitamin D   Oral BID  . cholecalciferol  5,000 Units Oral Daily  . DULoxetine  60 mg Oral Daily  . gabapentin  1,200 mg Oral TID  . heparin  5,000 Units Subcutaneous Q8H  . insulin aspart  0-9 Units Subcutaneous TID WC  . insulin glargine  20 Units Subcutaneous Daily  . latanoprost  1 drop Both Eyes QHS  . oxybutynin  10 mg Oral QHS  . pantoprazole sodium  20 mg Oral Daily  . vitamin C  1,000 mg Oral Daily   Continuous Infusions: . sodium chloride 50 mL/hr at 04/03/18 0705  . ceFEPime (MAXIPIME) IV Stopped (04/03/18 1505)  . vancomycin Stopped (04/03/18 0509)   PRN Meds:.acetaminophen, docusate sodium, oxyCODONE-acetaminophen **AND** oxyCODONE   Data Review:   Micro Results Recent Results (from the past 240 hour(s))  Gastrointestinal Panel by PCR , Stool     Status: None   Collection Time: 04/02/18  5:00 PM  Result Value Ref Range Status   Campylobacter species NOT DETECTED NOT DETECTED Final   Plesimonas shigelloides NOT DETECTED NOT DETECTED Final   Salmonella species NOT DETECTED NOT DETECTED Final   Yersinia enterocolitica NOT DETECTED NOT DETECTED Final   Vibrio species NOT DETECTED NOT DETECTED Final   Vibrio cholerae NOT DETECTED NOT DETECTED Final   Enteroaggregative E coli (EAEC) NOT DETECTED NOT DETECTED Final   Enteropathogenic E coli (EPEC) NOT DETECTED NOT DETECTED Final   Enterotoxigenic E coli (ETEC) NOT DETECTED NOT DETECTED Final   Shiga like toxin producing E coli (STEC) NOT DETECTED NOT DETECTED Final   Shigella/Enteroinvasive E coli (EIEC) NOT DETECTED NOT DETECTED Final   Cryptosporidium NOT DETECTED NOT DETECTED Final   Cyclospora cayetanensis NOT DETECTED NOT DETECTED Final   Entamoeba histolytica NOT DETECTED NOT DETECTED  Final  Giardia lamblia NOT DETECTED NOT DETECTED Final   Adenovirus F40/41 NOT DETECTED NOT DETECTED Final   Astrovirus NOT DETECTED NOT DETECTED Final   Norovirus GI/GII NOT DETECTED NOT DETECTED Final   Rotavirus A NOT DETECTED NOT DETECTED Final   Sapovirus (I, II, IV, and V) NOT DETECTED NOT DETECTED Final    Comment: Performed at [facility], [address] [place], [place] [address]  Blood culture (routine x 2)     Status: None (Preliminary result)   Collection Time: 04/02/18  5:05 PM  Result Value Ref Range Status   Specimen Description BLADDER BLOOD LEFT FOREARM  Final   Special Requests   Final    Blood Culture results may not be optimal due to an excessive volume of blood received in culture bottles   Culture   Final    NO GROWTH < 12 HOURS Performed at [facility], [address] [place], [place] [address]    Report Status PENDING  Incomplete  C difficile quick scan w PCR reflex     Status: None   Collection Time: 04/02/18  5:06 PM  Result Value Ref Range Status   C Diff antigen NEGATIVE NEGATIVE Final   C Diff toxin NEGATIVE NEGATIVE Final   C Diff interpretation No C. difficile detected.  Final    Comment: Performed at [facility], [address] [place], [place] [address]  Blood culture (routine x 2)     Status: None (Preliminary result)   Collection Time: 04/02/18  5:10 PM  Result Value Ref Range Status   Specimen Description BLOOD RIGHT ANTECUBITAL  Final   Special Requests   Final    BOTTLES DRAWN AEROBIC AND ANAEROBIC Blood Culture adequate volume   Culture   Final    NO GROWTH < 12 HOURS Performed at [facility], [address] [place], [place] [address]    Report Status PENDING  Incomplete  MRSA PCR Screening     Status: None   Collection Time: 04/03/18 12:02 AM  Result Value Ref Range Status   MRSA by PCR NEGATIVE NEGATIVE Final    Comment:        The GeneXpert MRSA Assay (FDA approved for NASAL  specimens only), is one component of a comprehensive MRSA colonization surveillance program. It is not intended to diagnose MRSA infection nor to guide or monitor treatment for MRSA infections. Performed at [facility], [address] [place], [place] [address]     Radiology Reports Dg Chest 2 View  Result Date: 04/02/2018 CLINICAL DATA:  Nausea vomiting and diarrhea EXAM: CHEST - 2 VIEW COMPARISON:  11/02/2016 FINDINGS: Cardiomegaly. No focal opacity or significant pleural effusion. Mild vascular congestion. Aortic atherosclerosis. No pneumothorax. Old fracture deformity of the proximal right humerus. IMPRESSION: 1. Cardiomegaly with vascular congestion Electronically Signed   By: Jasmine Pang M.D.   On: 04/02/2018 17:58     CBC Recent Labs  Lab 04/02/18 1705 04/03/18 0531  WBC 11.2* 6.5  HGB 13.6 11.9*  HCT 42.2 36.5  PLT 171 145*  MCV 87.5 86.7  MCH 28.2 28.2  MCHC 32.3 32.6  RDW 14.9* 14.9*  LYMPHSABS 0.2*  --   MONOABS 0.4  --   EOSABS 0.0  --   BASOSABS 0.0  --     Chemistries  Recent Labs  Lab 04/02/18 1659 04/03/18 0531  NA 130* 130*  K 4.7 4.3  CL 92* 97*  CO2 25 27  GLUCOSE 378* 267*  BUN 21* 25*  CREATININE 1.26* 1.33*  CALCIUM 9.3 8.2*  AST 43*  --   ALT 23  --   ALKPHOS 94  --   BILITOT 0.7  --    ------------------------------------------------------------------------------------------------------------------ estimated creatinine clearance is 62.5 mL/min (A) (by C-G formula based on SCr of 1.33 mg/dL (H)). ------------------------------------------------------------------------------------------------------------------ No results for input(s): HGBA1C in the last 72 hours. ------------------------------------------------------------------------------------------------------------------ No results for input(s): CHOL, HDL, LDLCALC, TRIG, CHOLHDL, LDLDIRECT in the last 72  hours. ------------------------------------------------------------------------------------------------------------------ No results for input(s): TSH, T4TOTAL, T3FREE, THYROIDAB in the last 72 hours.  Invalid input(s): FREET3 ------------------------------------------------------------------------------------------------------------------ No results for input(s): VITAMINB12, FOLATE, FERRITIN, TIBC, IRON, RETICCTPCT in the last 72 hours.  Coagulation profile No results for input(s): INR, PROTIME in the last 168 hours.  No results for input(s): DDIMER in the last 72 hours.  Cardiac Enzymes No results for input(s): CKMB, TROPONINI, MYOGLOBIN in the last 168 hours.  Invalid input(s): CK ------------------------------------------------------------------------------------------------------------------ Invalid input(s): POCBNP    Assessment & Plan   57 year old female patient with history of hypertension, tobacco abuse, diabetes mellitus type 2, hyperlipidemia under hospitalist service for sepsis, gastroenteritis and low blood pressure  1.  Sepsis improving C. difficile toxin is negative Lactic acid has trended down Follow-up cultures Currently on broad-spectrum antibiotics intravenously  2.  Acute gastroenteritis Appears to be viral in etiology IV fluid hydration C. difficile toxin is negative GI panel no pathogen detected DC isolation  3.  Diabetes mellitus type 2 Lantus insulin along with sliding scale coverage insulin  4.  Pulmonary edema Follow-up echocardiogram report  5.  Disposition Possible discharge in a.m. to home if cultures show no growth  6.  Hyponatremia Follow-up sodium level  7.  Tobacco cessation counseled to the patient for 6 minutes Nicotine patch offered Harmful effects of smoking explained to the patient.      Code Status Orders  (From admission, onward)        Start     Ordered   04/02/18 2241  Full code  Continuous     04/02/18 2240     Code Status History    Date Active Date Inactive Code Status Order ID Comments User Context   04/23/2016 1834 05/02/2016 2018 Full Code [id number]  Katha Hamming, MD ED      Time Spent in minutes   36  Greater than 50% of time spent in care coordination and counseling patient regarding the condition and plan of care.   Ihor Austin M.D on 04/03/2018 at 12:02 PM  Between 7am to 6pm - Pager - [phone]  After 6pm go to [email or web address] - Social research officer, government  [employer]   Office  [phone]

## 2018-04-03 NOTE — Progress Notes (Signed)
*  PRELIMINARY RESULTS* Echocardiogram 2D Echocardiogram has been performed.  [name] 04/03/2018, 3:07 PM

## 2018-04-03 NOTE — Progress Notes (Signed)
CODE SEPSIS - PHARMACY COMMUNICATION  **Broad Spectrum Antibiotics should be administered within 1 hour of Sepsis diagnosis**  Time Code Sepsis Called/Page Received: 2054  Antibiotics Ordered: vanc/zosyn/cefepime  Time of 1st antibiotic administration: 1911  Additional action taken by pharmacy:   If necessary, Name of Provider/Nurse Contacted:     Thomasene Ripple ,PharmD Clinical Pharmacist  04/03/2018  12:03 AM

## 2018-04-03 NOTE — Progress Notes (Signed)
Patient noted to be slightly tachycardic. Patient takes metoprolol at home twice daily, not ordered during hospitalization. Pharmacy consulted. Bo Mcclintock, RN

## 2018-04-04 ENCOUNTER — Ambulatory Visit: Payer: BLUE CROSS/BLUE SHIELD | Admitting: Internal Medicine

## 2018-04-04 LAB — GLUCOSE, CAPILLARY
GLUCOSE-CAPILLARY: 230 mg/dL — AB (ref 65–99)
Glucose-Capillary: 223 mg/dL — ABNORMAL HIGH (ref 65–99)

## 2018-04-04 LAB — BASIC METABOLIC PANEL
ANION GAP: 6 (ref 5–15)
BUN: 23 mg/dL — ABNORMAL HIGH (ref 6–20)
CHLORIDE: 97 mmol/L — AB (ref 101–111)
CO2: 27 mmol/L (ref 22–32)
Calcium: 8.1 mg/dL — ABNORMAL LOW (ref 8.9–10.3)
Creatinine, Ser: 1.07 mg/dL — ABNORMAL HIGH (ref 0.44–1.00)
GFR, EST NON AFRICAN AMERICAN: 57 mL/min — AB (ref >60)
Glucose, Bld: 233 mg/dL — ABNORMAL HIGH (ref 65–99)
POTASSIUM: 4.2 mmol/L (ref 3.5–5.1)
SODIUM: 130 mmol/L — AB (ref 135–145)

## 2018-04-04 LAB — HIV ANTIBODY (ROUTINE TESTING W REFLEX): HIV SCREEN 4TH GENERATION: NONREACTIVE

## 2018-04-04 NOTE — Progress Notes (Signed)
[name] to be D/C'd home per MD order.  Discussed prescriptions and follow up appointments with the patient. Prescriptions given to patient, medication list explained in detail. Pt verbalized understanding.  Allergies as of 04/04/2018      Reactions   Biaxin [clarithromycin] Rash   Patient states this medication gives her severe rash and thrush in the mouth.      Medication List    TAKE these medications   atorvastatin 20 MG tablet Commonly known as:  LIPITOR Take 20 mg by mouth at bedtime.   CALCIUM 600-D 600-400 MG-UNIT Tabs Generic drug:  Calcium Carbonate-Vitamin D3 Take by mouth 2 (two) times daily.   cholecalciferol 1000 units tablet Commonly known as:  VITAMIN D Take 5,000 Units by mouth daily.   DULoxetine 20 MG capsule Commonly known as:  CYMBALTA Take 60 mg by mouth daily.   furosemide 20 MG tablet Commonly known as:  LASIX Take 1 tablet (20 mg total) by mouth daily.   gabapentin 600 MG tablet Commonly known as:  NEURONTIN Take 1,200 mg by mouth 3 (three) times daily.   insulin glargine 100 UNIT/ML injection Commonly known as:  LANTUS Inject 70 Units into the skin daily.   insulin lispro 100 UNIT/ML injection Commonly known as:  HUMALOG Inject 15 Units into the skin 3 (three) times daily before meals.   latanoprost 0.005 % ophthalmic solution Commonly known as:  XALATAN Place 1 drop into both eyes at bedtime.   metFORMIN 500 MG 24 hr tablet Commonly known as:  GLUCOPHAGE-XR Take 1 tablet by mouth 2 (two) times daily.   metoprolol tartrate 25 MG tablet Commonly known as:  LOPRESSOR Take 1 tablet (25 mg total) by mouth 2 (two) times daily.   oxybutynin 10 MG 24 hr tablet Commonly known as:  DITROPAN-XL Take 10 mg by mouth at bedtime.   oxyCODONE-acetaminophen 10-325 MG tablet Commonly known as:  PERCOCET Take 1 tablet by mouth every 6 (six) hours as needed for pain.   pantoprazole 20 MG tablet Commonly known as:  PROTONIX Take 20 mg by  mouth daily.   traZODone 50 MG tablet Commonly known as:  DESYREL Take 1 tablet (50 mg total) by mouth at bedtime. What changed:  how much to take   vitamin C 1000 MG tablet Take 1,000 mg by mouth daily.       Vitals:   04/04/18 0044 04/04/18 0508  BP: 119/70 107/67  Pulse: 93 94  Resp:  17  Temp: 98.9 F (37.2 C) 98.2 F (36.8 C)  SpO2: 96% 92%    Skin clean, dry and intact without evidence of skin break down, no evidence of skin tears noted. IV catheter discontinued intact. Site without signs and symptoms of complications. Dressing and pressure applied. Pt denies pain at this time. No complaints noted.  An After Visit Summary was printed and given to the patient. Patient escorted via WC, and D/C home via private auto.  [name] A

## 2018-04-04 NOTE — Discharge Summary (Signed)
[facility] - [place] [facility] L Rout, 57 y.o., DOB Aug 08, 1961, MRN [id number]. Admission date: 04/02/2018 Discharge Date 04/04/2018 Primary MD Dortha Kern, MD Admitting Physician Altamese Dilling, MD  Admission Diagnosis   1.sepsis 2.Gastroenteritis 3.Hyperglycemia 4.Glaucoma 5.Hypertension 6.Tobacco abuse  Discharge Diagnosis      1.Gastroenteritis resolved 2.Sepsis ruled out 3.Hyperglycemia resolved 4.Hypertension  Hospital Course  57 yr old female patient with hypertension, tobacco abuse,arthritis,diabetes mellitus , gerd admitted for nausea, vomitings, and diarrhea.In ER she was having low blood pressure and was dehydrated. Patient admitted to medical floor and received iv fluids. Stool for ova, cyst was negative and clostridium difficile colitis was negative.Cultures of blood and urine did not revel any growth. Sugars were controlled with sliding scale Insulin coverage.Stool culture no growth. Tobacco cessation counseled to patient.Initally broad spectrum antibiotics given which were stopped later. Patient will be discharged to home.  Consults  None  Significant Tests:  See full reports for all details    Dg Chest 2 View  Result Date: 04/02/2018 CLINICAL DATA:  Nausea vomiting and diarrhea EXAM: CHEST - 2 VIEW COMPARISON:  11/02/2016 FINDINGS: Cardiomegaly. No focal opacity or significant pleural effusion. Mild vascular congestion. Aortic atherosclerosis. No pneumothorax. Old fracture deformity of the proximal right humerus. IMPRESSION: 1. Cardiomegaly with vascular congestion Electronically Signed   By: Jasmine Pang M.D.   On: 04/02/2018 17:58       Today   Subjective:   [name]  Is a 57 yr old patient in no distress No vomitings No nausea No abdominal pain  Objective:   Blood pressure 107/67, pulse 94, temperature 98.2 F (36.8 C), temperature source Oral, resp. rate 17, height 5\' 3"  (1.6 m), weight 131 kg (288 lb 12.8 oz),  SpO2 92 %.  .  Intake/Output Summary (Last 24 hours) at 04/04/2018 2128 Last data filed at 04/04/2018 0958 Gross per 24 hour  Intake 240 ml  Output -  Net 240 ml    Exam VITAL SIGNS: Blood pressure 107/67, pulse 94, temperature 98.2 F (36.8 C), temperature source Oral, resp. rate 17, height 5\' 3"  (1.6 m), weight 131 kg (288 lb 12.8 oz), SpO2 92 %.  GENERAL:  57 y.o.-year-old patient lying in the bed with no acute distress.  EYES: Pupils equal, round, reactive to light and accommodation. No scleral icterus. Extraocular muscles intact.  HEENT: Head atraumatic, normocephalic. Oropharynx and nasopharynx clear.  NECK:  Supple, no jugular venous distention. No thyroid enlargement, no tenderness.  LUNGS: Normal breath sounds bilaterally, no wheezing, rales,rhonchi or crepitation. No use of accessory muscles of respiration.  CARDIOVASCULAR: S1, S2 normal. No murmurs, rubs, or gallops.  ABDOMEN: Soft, nontender, nondistended. Bowel sounds present. No organomegaly or mass.  EXTREMITIES: No pedal edema, cyanosis, or clubbing.  NEUROLOGIC: Cranial nerves II through XII are intact. Muscle strength 5/5 in all extremities. Sensation intact. Gait not checked.  PSYCHIATRIC: The patient is alert and oriented x 3.  SKIN: No obvious rash, lesion, or ulcer.   Data Review     CBC w Diff:  Lab Results  Component Value Date   WBC 6.5 04/03/2018   HGB 11.9 (L) 04/03/2018   HCT 36.5 04/03/2018   PLT 145 (L) 04/03/2018   LYMPHOPCT 2 04/02/2018   BANDSPCT 13 04/23/2016   MONOPCT 4 04/02/2018   EOSPCT 0 04/02/2018   BASOPCT 0 04/02/2018   CMP:  Lab Results  Component Value Date   NA 130 (L) 04/04/2018   K 4.2 04/04/2018  CL 97 (L) 04/04/2018   CO2 27 04/04/2018   BUN 23 (H) 04/04/2018   CREATININE 1.07 (H) 04/04/2018   PROT 8.1 04/02/2018   ALBUMIN 3.6 04/02/2018   BILITOT 0.7 04/02/2018   ALKPHOS 94 04/02/2018   AST 43 (H) 04/02/2018   ALT 23 04/02/2018  .  Micro Results Recent  Results (from the past 240 hour(s))  Gastrointestinal Panel by PCR , Stool     Status: None   Collection Time: 04/02/18  5:00 PM  Result Value Ref Range Status   Campylobacter species NOT DETECTED NOT DETECTED Final   Plesimonas shigelloides NOT DETECTED NOT DETECTED Final   Salmonella species NOT DETECTED NOT DETECTED Final   Yersinia enterocolitica NOT DETECTED NOT DETECTED Final   Vibrio species NOT DETECTED NOT DETECTED Final   Vibrio cholerae NOT DETECTED NOT DETECTED Final   Enteroaggregative E coli (EAEC) NOT DETECTED NOT DETECTED Final   Enteropathogenic E coli (EPEC) NOT DETECTED NOT DETECTED Final   Enterotoxigenic E coli (ETEC) NOT DETECTED NOT DETECTED Final   Shiga like toxin producing E coli (STEC) NOT DETECTED NOT DETECTED Final   Shigella/Enteroinvasive E coli (EIEC) NOT DETECTED NOT DETECTED Final   Cryptosporidium NOT DETECTED NOT DETECTED Final   Cyclospora cayetanensis NOT DETECTED NOT DETECTED Final   Entamoeba histolytica NOT DETECTED NOT DETECTED Final   Giardia lamblia NOT DETECTED NOT DETECTED Final   Adenovirus F40/41 NOT DETECTED NOT DETECTED Final   Astrovirus NOT DETECTED NOT DETECTED Final   Norovirus GI/GII NOT DETECTED NOT DETECTED Final   Rotavirus A NOT DETECTED NOT DETECTED Final   Sapovirus (I, II, IV, and V) NOT DETECTED NOT DETECTED Final    Comment: Performed at [facility], [address] [place], [place] [address]  Blood culture (routine x 2)     Status: None (Preliminary result)   Collection Time: 04/02/18  5:05 PM  Result Value Ref Range Status   Specimen Description BLADDER BLOOD LEFT FOREARM  Final   Special Requests   Final    Blood Culture results may not be optimal due to an excessive volume of blood received in culture bottles   Culture   Final    NO GROWTH 2 DAYS Performed at [facility], [address] [place], [place] [address]    Report Status PENDING  Incomplete  C difficile quick scan w PCR reflex      Status: None   Collection Time: 04/02/18  5:06 PM  Result Value Ref Range Status   C Diff antigen NEGATIVE NEGATIVE Final   C Diff toxin NEGATIVE NEGATIVE Final   C Diff interpretation No C. difficile detected.  Final    Comment: Performed at [place] [facility], [address] [place], [place] [address]  Blood culture (routine x 2)     Status: None (Preliminary result)   Collection Time: 04/02/18  5:10 PM  Result Value Ref Range Status   Specimen Description BLOOD RIGHT ANTECUBITAL  Final   Special Requests   Final    BOTTLES DRAWN AEROBIC AND ANAEROBIC Blood Culture adequate volume   Culture   Final    NO GROWTH 2 DAYS Performed at [facility], [address], [place], [place] [address]    Report Status PENDING  Incomplete  MRSA PCR Screening     Status: None   Collection Time: 04/03/18 12:02 AM  Result Value Ref Range Status   MRSA by PCR NEGATIVE NEGATIVE Final    Comment:  The GeneXpert MRSA Assay (FDA approved for NASAL specimens only), is one component of a comprehensive MRSA colonization surveillance program. It is not intended to diagnose MRSA infection nor to guide or monitor treatment for MRSA infections. Performed at [facility], [address], [place], [place] [address]      Code Status History    Date Active Date Inactive Code Status Order ID Comments User Context   04/02/2018 2241 04/04/2018 1837 Full Code [id number]  Altamese Dilling, MD Inpatient   04/23/2016 1834 05/02/2016 2018 Full Code [id number]  Katha Hamming, MD ED          Follow-up Information    Dortha Kern, MD. Go on 04/16/2018.   Specialty:  Family Medicine Why:  Follow up appt with Dr Quillian Quince 04/16/18 @ 9:30am Contact information: [address] [place] [address] [phone]           Discharge Medications   Allergies as of 04/04/2018      Reactions   Biaxin [clarithromycin] Rash   Patient states this medication gives her severe  rash and thrush in the mouth.      Medication List    TAKE these medications   atorvastatin 20 MG tablet Commonly known as:  LIPITOR Take 20 mg by mouth at bedtime.   CALCIUM 600-D 600-400 MG-UNIT Tabs Generic drug:  Calcium Carbonate-Vitamin D3 Take by mouth 2 (two) times daily.   cholecalciferol 1000 units tablet Commonly known as:  VITAMIN D Take 5,000 Units by mouth daily.   DULoxetine 20 MG capsule Commonly known as:  CYMBALTA Take 60 mg by mouth daily.   furosemide 20 MG tablet Commonly known as:  LASIX Take 1 tablet (20 mg total) by mouth daily.   gabapentin 600 MG tablet Commonly known as:  NEURONTIN Take 1,200 mg by mouth 3 (three) times daily.   insulin glargine 100 UNIT/ML injection Commonly known as:  LANTUS Inject 70 Units into the skin daily.   insulin lispro 100 UNIT/ML injection Commonly known as:  HUMALOG Inject 15 Units into the skin 3 (three) times daily before meals.   latanoprost 0.005 % ophthalmic solution Commonly known as:  XALATAN Place 1 drop into both eyes at bedtime.   metFORMIN 500 MG 24 hr tablet Commonly known as:  GLUCOPHAGE-XR Take 1 tablet by mouth 2 (two) times daily.   metoprolol tartrate 25 MG tablet Commonly known as:  LOPRESSOR Take 1 tablet (25 mg total) by mouth 2 (two) times daily.   oxybutynin 10 MG 24 hr tablet Commonly known as:  DITROPAN-XL Take 10 mg by mouth at bedtime.   oxyCODONE-acetaminophen 10-325 MG tablet Commonly known as:  PERCOCET Take 1 tablet by mouth every 6 (six) hours as needed for pain.   pantoprazole 20 MG tablet Commonly known as:  PROTONIX Take 20 mg by mouth daily.   traZODone 50 MG tablet Commonly known as:  DESYREL Take 1 tablet (50 mg total) by mouth at bedtime. What changed:  how much to take   vitamin C 1000 MG tablet Take 1,000 mg by mouth daily.          Total Time in preparing paper work, data evaluation and todays exam - 35 minutes  Ihor Austin M.D on 04/04/2018  at 9:28 PM [facility]   Office  [phone]

## 2018-04-04 NOTE — Progress Notes (Signed)
Advanced care plan.  Purpose of the Encounter: CODE STATUS  Parties in Attendance: patient  Patient's Decision Capacity:Good  Subjective/Patient's story: Presented for nausea, vomiting and diarrhea  Objective/Medical story  Admitted for dehydration, diarrhea, vomitings for iv fluids  Goals of care determination:  Advance directives discussed Patient wants everything done such as cardiac resuscitation, intubation, ventilator if need arises  CODE STATUS: Full code   Time spent discussing advanced care planning: 16 minutes

## 2018-04-05 ENCOUNTER — Ambulatory Visit: Payer: BLUE CROSS/BLUE SHIELD

## 2018-04-07 LAB — CULTURE, BLOOD (ROUTINE X 2)
Culture: NO GROWTH
Culture: NO GROWTH
Special Requests: ADEQUATE

## 2018-04-11 ENCOUNTER — Encounter: Payer: BLUE CROSS/BLUE SHIELD | Admitting: Internal Medicine

## 2018-04-11 DIAGNOSIS — I10 Essential (primary) hypertension: Secondary | ICD-10-CM | POA: Diagnosis not present

## 2018-04-11 DIAGNOSIS — E11622 Type 2 diabetes mellitus with other skin ulcer: Secondary | ICD-10-CM | POA: Diagnosis not present

## 2018-04-11 DIAGNOSIS — L89323 Pressure ulcer of left buttock, stage 3: Secondary | ICD-10-CM | POA: Diagnosis not present

## 2018-04-11 DIAGNOSIS — J45909 Unspecified asthma, uncomplicated: Secondary | ICD-10-CM | POA: Diagnosis not present

## 2018-04-11 DIAGNOSIS — E1151 Type 2 diabetes mellitus with diabetic peripheral angiopathy without gangrene: Secondary | ICD-10-CM | POA: Diagnosis not present

## 2018-04-11 DIAGNOSIS — L97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 severity: Secondary | ICD-10-CM | POA: Diagnosis not present

## 2018-04-11 DIAGNOSIS — F17210 Nicotine dependence, cigarettes, uncomplicated: Secondary | ICD-10-CM | POA: Diagnosis not present

## 2018-04-11 DIAGNOSIS — E1140 Type 2 diabetes mellitus with diabetic neuropathy, unspecified: Secondary | ICD-10-CM | POA: Diagnosis not present

## 2018-04-11 DIAGNOSIS — L89324 Pressure ulcer of left buttock, stage 4: Secondary | ICD-10-CM | POA: Diagnosis not present

## 2018-04-11 DIAGNOSIS — L89314 Pressure ulcer of right buttock, stage 4: Secondary | ICD-10-CM | POA: Diagnosis present

## 2018-04-11 DIAGNOSIS — Z87898 Personal history of other specified conditions: Secondary | ICD-10-CM | POA: Diagnosis not present

## 2018-04-12 ENCOUNTER — Ambulatory Visit: Payer: BLUE CROSS/BLUE SHIELD

## 2018-04-13 LAB — STOOL CULTURE: E COLI SHIGA TOXIN ASSAY: NEGATIVE

## 2018-04-13 LAB — STOOL CULTURE REFLEX - RSASHR

## 2018-04-13 LAB — STOOL CULTURE REFLEX - CMPCXR

## 2018-04-16 NOTE — Progress Notes (Signed)
CASILDA, PICKERILL ([id number]) Visit Report for 04/11/2018 HPI Details Patient Name: Katelyn, [name] Date of Service: 04/11/2018 8:00 AM Medical Record Number: [id number] Patient Account Number: [id number] Date of Birth/Sex: 1961/07/07 (57 y.o. F) Treating RN: Huel Coventry Primary Care Provider: Joen [name] Other Clinician: Referring Provider: Joen [name] Treating Provider/Extender: Altamese [place] in Treatment: 56 History of Present Illness HPI Description: 57 year old patient who was seen by visiting [name] wound care specialist for a wound on both her buttock and was found to have an unstageable wound on the right buttock for about 2 months. I understand that she had a fall and was laying on the floor for about 48 hours before she was found and taken to the ICU and had a long injury to her gluteal area from pressure and also had broken her right humerus. She has had a right proximal humerus fracture and has been followed up with orthopedics recently. The patient has a past medical history of type 2 diabetes mellitus, paraparesis, acute pyelonephritis, GERD, hypertension, glaucoma, chronic pain, anxiety neurosis, nicotine dependence, COPD. the patient had some debridement done and was to operative was recommended to use Silvadene dressing and offloading. She is a smoker and occasionally smokes a few cigarettes. the patient requested a second opinion for months and is here to discuss her care. 56/27/17; the patient re-presents from home today for review of 3 different wounds. I note that she was seen in the clinic here in July at which time she had bilateral buttock wounds. It was apparently suggested at that time that she use a wound VAC bridged to both wounds just near the initial tuberosity's bilaterally which she refused. The history was a bit difficult to put together. Apparently this patient became ill at the end of April of this year. She was found sitting on the floor she had  apparently been for 2 days and subsequently admitted to hospital from 04/23/16 through 05/02/16 and at that point she was critically ill ultimately having sepsis secondary to UTI, nontraumatic rhabdomyolysis and diabetic ketoacidosis. She had acute renal failure and I think required ICU care including intubation. Patient states her wounds actually started at that point on the bilateral issue tuberosities however in reviewing the discharge summary from 5/8 I see no reference to wounds at that point. It did state that she had left lower extremity cellulitis however. Reviewing Epic I see no relevant x-rays. It would appear that her discharge creatinine was within the normal range and indeed on 9/15 her creatinine has remained normal. [facility] There the wounds on her bilateral Buttocks were dressed. Only just before her discharge from the nursing facility she developed an "knot" which was interpreted as cellulitis on the posterior aspect of her left knee she was given antibiotics. Apparently sometime late in July a this actually opened and became a wound at home health care was tending to however she is still having purulent drainage coming from this and by my understanding the wound depth is actually become unmeasurable. I am not really clear about what home health has been placing in any of these wound areas. The patient states that is something with silver and it. She is not been systemically unwell no fever or chills her appetite is good. She is a diabetic poorly controlled however she states that her recent blood sugars at home have been in the low to mid 100s. 09/28/16 On evaluation today patient appears to continue to  exhibit the 3 areas of ulceration that were noted previous. She did have an x-ray of the right pelvis which showed evidence of potential soft tissue infection but no obvious osteomyelitis. There was a discussion last office  visit concerning the possibility of a wound VAC. Witth that being said the x-ray report suggested that an MRI may be more appropriate to further evaluate the extent. Subsequently in regard to the wound over the popliteal portion of the left lower extremity with tunneling at 12:00 the CT scan that was ordered was denied by insurance as they state the patient has not had x-rays prior to advanced imaging. Patient states that she is frustrated with the situation overall. 10/05/16 in the interval since I last saw this patient last week she has had the x-ray of the knee performed. I did review that x-ray today and fortunately shows no evidence of osteomyelitis or other acute abnormality at this point in time. She continues to have the opening iin the posterior left popliteal space with tracking proximal up the posterior thigh. Nothing seems to have worsened but it also seems to have not improved. The same is true in regard to the right pressure ulcer over the gluteal region which extends toward the ischium. The left gluteal pressure ulcer actually appears to be doing somewhat better my opinion there is some necrotic slough but overall this appears fairly well. She tells me thatt she has some discomfort 57 [name], [name] ([id number]) especially when home health is helping her with dressing changes as they do not know her. At worse she rates her pain to be a 5 out of 10 right now it's more like a 1 out of 10. 10/07/16; still the patient has 57 3 different wound areas. She has a deep stage IV wound over her right ischial tuberosity. She is due to have an MRI next week. The wound over her left ischial tuberosity is more superficial and underwent debridement today. Finally she has a small open area in her left popliteal fossa the probes on measurably forward superiorly. Still a lot of drainage coming out of this. The CT scan that I ordered 3 weeks ago was questioned by her insurance company wanting a plain x-ray  first. As I understand things result of this is nothing has been done in 3 weeks in terms of imaging the thigh and she has an MRI booked of this along with her pelvis for next week line 10/12/16; patient has a deep probing wound over the right ischiall tuberosity, stage III wound over the left visual tuberosity and a draining sinus in her left popliteal fossa. None of this much different from when I saw this 3 weeks ago. We have been using silver rope to the right ischial wound and a draining area in the left popliteal fossa. Plain silver alginate to the area on the left ischial tuberosity 10/19/16; the patient's wounds are essentially unchanged although the area on the left lower gluteal is actually improved. Our intake nurse noted drainage from the right initial tuberosity probing wound as well as the draining area in the left popliteal fossa. Both of these were cultured. She had x-rays I think at the insistence of her insurance company on 09/23/16 x-ray of the pelvis was not particularly helpful she did have soft tissue air over the right lower pelvis although with the depth of this wound this is not surprising. An x-ray of her left knee did not show any specific abnormalities. We are still using silver alginate to  these wound areas. Her MRI is booked for 10/27 10/26/16; cultures of the purulent drainage in her right initial tuberosity wound grew moderate Proteus and few staph aureus. The same organisms were cultured out of the left knee sinus tract posteriorly. The staph aureus is MRSA. I had started her on Augmentin last week I added doxycycline. The MRI of the left lower extremity and pelvis was finally done. The MRI of the femur showed subcutaneous soft tissue swelling edema fluid and myositis in the vastus lateralis muscle but no soft tissue abscess septic arthritis or osteomyelitis. MRI of the pelvis showed the left wound to be more expensive extending down to the bone there was  osteomyelitis. Left hamstring tendons were also involved. No septic arthritis involving the hip. The decubitus ulcer on the right side showed no definite osteomyelitis or abscess.. The right hip wound is actually the one the probes 6 cm downward. But the MRI showing infection including osteomyelitis on the left explains the draining sinus in the popliteal fossa on the left. She did have antibiotics in the hospitalization last time and this extended into her nursing home stay but I'm not exactly sure what antibiotics and for what duration. According the patient this did include vancomycin with considerable effort of our staff we are able to get the patient into see Dr. Sampson Goon today. There were transportation difficulties. Her mother had open heart surgery and is in the ICU in [place] therefore her brother was unable to transport. Dr. Jarrett Ables office graciously arranged time to see her today. From my point of view she is going to require IV vancomycin plus perhaps a third generation cephalosporin. I plan to keep her on doxycycline and Augmentin until the IV antibiotics can be arranged. 11/02/2016 - Latorria presents today for management of ulcers; She saw Dr. 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 She is to have the PICC line placed today and receive the initial dose for both antibiotics today. She has yet to receive the offloading chair cushion and/or mattress overlay from home health, apparently this has been a 3 week process. I encouraged her to speak to home health regarding this matter, along with offering home health to contact the wouns care center with any questions or concerns. The left ischial pressure ulcer continues to imporve, while to right ischial ulcer has increased in depth. The popliteal fossa sinus tract remains unmeasurable due to the limitation of depth measurement (tract  extends beyond our measuring devices). 11-16-2016 Ms. Binford presents today for evaluation and management of bilateral ischial stage IV pressure ulcers and sinus tract to the left popliteal fossa. she is under the care of Dr. Sampson Goon for IV antibiotic therapy; she states that the vancomycin was placed on hold and will be restarted at a lower dose based on her renal function. She continues taking Zosyn in addition to the vancomycin. She also states that she has yet to receive offloading cushions from home health, according to her she does not qualify for these offloading cushions because "the ulcers are unstageable ". We will contact the home health agency today to lend clarity regarding her pressure ulcers. The left popliteal fossa sinus tract continues to be a measurable as it extends beyond the length of our measuring devices.. 11/30/16; the patient is still on vancomycin and Zosyn. The depth of the draining sinus behind her left popliteal fossa is down to 4 cm although there is still serosanguineous drainage  coming out of this. She saw Dr. Sampson Goon of infectious disease yesterday the idea is to weeks more of IV antibiotics and then oral antibiotics although I have not read his note. The area on the left gluteal fold is just about healed. She has a 6 cm draining sinus over the right initial tuberosity although I cannot feel bone at the base of this. As far as the patient is aware she has not had a recheck of her inflammatory markers. 12/07/16; patient is on vancomycin and Zosyn appointment with Dr. Sampson Goon on the 19th at which point the patient expects to have a change in antibiotics. Remarkable improvement over the wound over the left ischial tuberosity which is just about closed. The draining sinus in her popliteal fossa has 0.4 cm in depth. The area on the right ischial tuberosity still probes down 7 cm. This is closed and overall wound dimensions but not depth. EVGENIA, MERRIMAN  ([id number]) 12/14/16; patient is completing her vancomycin and Zosyn and per her she is going to transition to Bactrim and Augmentin for another 3 weeks. The area on her left gluteal fold is closed except for some skin tears. The area behind her left knee is no longer has any depth. The only remaining area that is of clinical concern is on the right gluteal fold probing towards the right ischial tuberosity. Today this measures 6.9 cm in depth. Very gritty surface 12/21/16; patient is now on Bactrim and Augmentin as directed by infectious disease. This should be for another 2 weeks. The area in her left gluteal fold and left popliteal are closed over and fully healed. Measurements today at 7 cm in the right buttock wound is unchanged from last week. 12/27/16; patient is on Bactrim and Augmentin for another week as directed by infectious disease. She has completed her IV antibiotics. She is not been systemically unwell no fever no chills. The area on her right buttock measured over 6 cm in depth. There is no palpable bone. No evidence of surrounding soft tissue infection. She is complaining of tongue irritation and has a history of thrush 01/04/17; patient is been back to see Dr. Sampson Goon, her Augmentin was stopped but he continued the Bactrim for another 3 weeks. Depth of the wound is 6.7 cm there is been no major change in either direction. She is not receive the wound VAC from home health I think because of confusion about who is supposed to provided will actually talk to the home health agency today [[facility] The net no major change. 01/18/17; patient obtained her wound VAC about 10 days ago however for some reason it was not actually put on the wound. She is therefore here for [place] to apply this I guess. No other issues are noted. She is not complaining of pain fever drainage 02/01/17; patient is here now having the wound VAC or 3-4 weeks to a deep pressure area over the right initial tuberosity.  This measures 6 cm in depth today which is about half a centimeter better than 2 weeks ago. There is no evidence she is systemically unwell no fever no chills no pain around the area. 02/15/17; I follow this patient every 2 weeks for a deep area over the right ischial tuberosity. This measures 5.5 cm today which is a continued improvement of 1.2 cm from 1/10 and down 0.5 cm from her visit 2 weeks ago 03/01/17; continued difficult area over the right initial tuberosity using the wound VAC. Depth today of 5.1 cm which  is improved. Does not appear to be a lot of drainage in the canister. Antibiotics were finally stopped by Dr. Sampson Goon bactrim[]  and inflammatory markers have been repeated 03/15/17; fall this lady every 2 weeks for a difficult area over her lower right gluteal area/ischial tuberosity. Depth today at 4.6 cm. This is a slow but steady improvement in the depth of this wound. Although we have labeled this as a pressure ulcer there may have been an underlying infection here at one point before we saw her. She had osteomyelitis on the left extensively which is since resolved 03/29/17- patient is here for follow-up evaluation of her right ischial pressure ulcer. She continues with the wound VAC and home health. According to the nurse home health has been using less foams and appropriate and we will instruct accordingly. The patient continues to smoke, approximately 10 cigarettes a day. She has been advised to decrease that in half by her next appointment, with a goal of complete cessation. She states her blood sugars have been consistently less than 150. She states that she spends most of her day position left lateral or prone. She does have an air mattress on her bed, she does not have an mattress for her chair, she states she cannot afford this. 04/12/17; patient is here for evaluation of her right ischial pressure ulcer. We continue to use a wound VAC with minimal improvement today the depth  of this measuring 4.4 cm versus 4.6 cm 2 weeks ago. We are using a [employer] wound VAC on this area. There is not excessive drainage no pain. The patient tells me she tries to keep off this in bed but is up in the wheelchair for 2 hours a day. She is limited in no her overall ambulation but is improving and apparently is getting bilateral lower extremity braces which she hopes will improve her ability to walk independently. 05/10/17; Depth at 3.3cm. Improved 05/24/17; depth at 3.5 cm. This is not improved since last time. Not clear if they are using collagen under the foam 06/07/17; depth that 2.9 which is a slight improvement. Still using the wound VAC. 06/21/17; depth is 2.9 cm which is exactly the same as last time. Also the appearance of this wound is completely the same. We have been using silver collagen under a wound VAC. 07/05/17 patient presents today for reevaluation concerning her right Ischial wound. She had switched insurance companies and so it does appear that the Wound VAC needs to be reauthorized which we are working on this morning. Nonetheless her wound has been saying about the same we have continued to use the silver collagen underneath the wound VAC. She has no discomfort. 07/19/17; patient follows every 2 weeks for her right if she'll wound. Since I last saw this a month ago her dimensions of come down to 2.6 cm. This is slightly down from a month ago when it was 2.9 cm in 4 months ago it was 4.6 cm. I note that there were insurance company issues with regard to the wound VAC which she apparently is not having on for several weeks now. I think it would be reasonable to change therapies here. Will use silver alginate rope 08/02/17 on evaluation today patient's wound actually appears to be doing significantly better in regard to the depth as compared to her previous evaluation. She is having no discomfort. Unfortunately she never got the Wound VAC reapproved following the insurance issues  from several weeks ago. With that being said it does not  appear that she needs to requires the Wound VAC anymore and in fact I feel that she is doing better and making greater improvements at this point without it. Fortunately she has no nausea, vomiting, diarrhea, fevers, or chills. 08/16/17; patient's wound depth down to 2.3 cm. She is using silver alginate packing rope. Note that her wound VAC was [name], [name] ([id number]) discontinued due to insurance issues. This is not particularly surprising. She has not been systemically unwell and has no other new complaints 08/30/17; no change in depth. Still at 2.3 cm. Still with the same gritty surface requiring debridement. I've been using silver alginate for quite a period of time although this came down nicely in the last month 10/04/17; the depth of this is 2 cm however with careful inspection under high-intensity light most of the walls of this small probing sinus seemed to be normally epithelialized. I cannot exactly see the base of this however there is been no drainage. We've been using silver alginate there is no drainage on the dressing when it is removed. 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 There is no evidence of surrounding infection or pain. The patient had many review weakness in her bilateral legs. She is already been to see a neurologist who according the patient told her "you would never walk again" 11/01/17; this is a patient I last saw a month ago. At that point the linear tunnel in her right buttock was 2 cm. It was not possible to see the depth of this as skin had grown into the tunnel. In light of the fact that there was no drainage and no visible wound I recommended that we just allow her to go about her usual activity without dressing. She reports that almost immediately she noted drainage on her clothing although in spite of her instructions the contrary she did not come back to  the clinic. She has not been specifically addressing this and is continued to no drainage without other symptoms either local or systemic 11/29/17; I follow this woman monthly. Last time the wound on her right buttock was 1.8 cm today measuring at 1.4 there has been gradual improvement in this. Concerning is the fact that the patient says that Dr. Sampson Goon of infectious disease changed her from doxycycline to Augmentin apparently because of the elevated inflammatory markers. She also said he did a culture of this area but she is not heard the results. I don't think I have his information available on care everywhere however I will check this. She is using Hydrofera Blue rope. The patient and her intake nurse report that she is still having identifiable drainage which certainly makes it clear that this is not closed. 01/02/17 on evaluation today patient appears to be doing fairly well in regard to her left gluteal wound. She has been continuing with the [facility] Dressing's here in our office. We actually see her once a month and then subsequently are performing weekly dressing changes with Hydrofera Blue Dressing rope during nurse visits one time a week. Today there really was not much drainage at this point. However this has happened previously where she had no drainage and was essentially thought to be close internally although the external had not completely pulled together. However then she reopened and began draining again. He has been mentioned the possibility of her seeing plastic surgery to try to get this finally and completely healed. 01/18/18 she is here in follow-up evaluation for right ischial pressure  ulcer. She has an appointment with [facility] plastic surgery on 2/8 for evaluation. She continues to have minimal drainage at the tip of the dressing product. She continues to smoke, admits to 8-10 cigarettes per day. No change in treatment plan 02/06/18 on evaluation today patient appears  to be doing about the same in regard to her right Ischial wound. She has been tolerating the dressing changes without complication. She did have her appointment at [facility] plastic surgery she was not really impressed. They stated that due to her weight they were unable to perform a flap and subsequently recommended the only thing they could attempt would be to exercise around the ulcer and then attempt to suture it together. The question is whether this would be effective or not. Patient really is not wanting to proceed down that road. Nonetheless she really have not changed much in regard to her wound measurements. 03/06/18 on evaluation today patient states that last week when I perform the silver nitrate that she actually had some burning pain for about two days following. Since that time the area has been very dry and she states that it is also been very dark and scabbed around the area. With that being said other than this she feels that the hope was the fact that it was burning was a good sign and that they would be improvement but the really does not appear to be significant improvement at this point. We have tried a lot of different dressings for her most recently Hydrofera Blue Dressing, silver alginate rope, and collagen. We have never attempted Endoform which I think could be a possibility for her. 03/22/18-She is here for a follow-up evaluation of her right ischial pressure ulcer. There is no drainage on her dressings and what appears to be epidermal debris in the ulcer; she has no pain. The ulcer was cleanse with betadine, I believe it is healed, despite remaining a  1" depth. We will place a dry dressing over and evaluate next week for drainage, if no drainage she will be discharged at that time. 04/11/18; right ischial pressure ulcer that at one point was stage IV.Marland Kitchen When she first presented to this clinic she had osteomyelitis of the left ischial tuberosity with infection noted probe down  the buttock of her left thigh exiting in the left popliteal fossa. She underwent a prolonged course of IV antibiotics as directed by Dr. Sampson Goon. The tunneling right ischial pressure ulcer as eventually closed over. We've been following this over the last month or so with no identifiable area that was open. We've been using Endoform and hydrogel. She rising clinic today, our intake nurse reported some dampness. Electronic Signature(s) KAYDEE, MAGEL ([id number]) Signed: 04/11/2018 5:09:03 PM By: Baltazar Najjar MD Entered By: Baltazar Najjar on 04/11/2018 08:51:38 Badger, [name] ([id number]) -------------------------------------------------------------------------------- Physical Exam Details Patient Name: [name] Date of Service: 04/11/2018 8:00 AM Medical Record Number: [id number] Patient Account Number: [id number] Date of Birth/Sex: 08/24/1961 (57 y.o. F) Treating RN: Huel Coventry Primary Care Provider: Joen [name] Other Clinician: Referring Provider: Joen [name] Treating Provider/Extender: Altamese [place] in Treatment: 59 Constitutional Sitting or standing Blood Pressure is within target range for patient.. Pulse regular and within target range for patient.Marland Kitchen Respirations regular, non-labored and within target range.. Temperature is normal and within the target range for the patient.Marland Kitchen appears in no distress. Notes wound exam; the patient's wound area has some depth. I probed this with a Q-tip and there is no dampness the Q-tip  was dry. There is no visible surface that you can determine absolutely that there was no opening remaining. There is clearly some depth to this however I think all of it may be epithelialized. Electronic Signature(s) Signed: 04/11/2018 5:09:03 PM By: Baltazar Najjar MD Entered By: Baltazar Najjar on 04/11/2018 08:53:10 Bainter, [name] ([id number]) -------------------------------------------------------------------------------- Physician Orders  Details Patient Name: [name] Date of Service: 04/11/2018 8:00 AM Medical Record Number: [id number] Patient Account Number: [id number] Date of Birth/Sex: 06/12/1961 (57 y.o. F) Treating RN: Huel Coventry Primary Care Provider: Joen [name] Other Clinician: Referring Provider: Joen [name] Treating Provider/Extender: Altamese [place] in Treatment: 4 Verbal / Phone Orders: No Diagnosis Coding Wound Cleansing Wound #3 Right Gluteal fold o Clean wound with Normal Saline. Skin Barriers/Peri-Wound Care Wound #3 Right Gluteal fold o Skin Prep Primary Wound Dressing Wound #3 Right Gluteal fold o Other: - Bordered Foam Dressing Dressing Change Frequency Wound #3 Right Gluteal fold o Other: - as needed for protection Off-Loading Wound #3 Right Gluteal fold o Turn and reposition every 2 hours Additional Orders / Instructions Wound #3 Right Gluteal fold o Increase protein intake. Discharge From [facility] o Discharge from [facility] Center - treatment complete Electronic Signature(s) Signed: 04/11/2018 5:09:03 PM By: Baltazar Najjar MD Signed: 04/11/2018 5:11:52 PM By: Elliot Gurney, BSN, RN, CWS, Kim RN, BSN Entered By: Elliot Gurney, BSN, RN, CWS, Kim on 04/11/2018 08:34:48 Duval, [name] ([id number]) -------------------------------------------------------------------------------- Problem List Details Patient Name: [name] Date of Service: 04/11/2018 8:00 AM Medical Record Number: [id number] Patient Account Number: [id number] Date of Birth/Sex: 29-Dec-1960 (57 y.o. 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 09/21/2016 09/21/2016 Resolved Problems ICD-10 Code Description Active Date Resolved Date L89.323 Pressure ulcer of left buttock, stage 3 09/21/2016 09/21/2016 L89.324 Pressure ulcer of left buttock, stage 4 11/16/2016 11/16/2016 Electronic Signature(s) Signed: 04/11/2018 5:09:03 PM By: Baltazar Najjar MD Entered By: Baltazar Najjar on 04/11/2018 08:46:32 Rivet, [name] ([id number]) -------------------------------------------------------------------------------- Progress Note Details Patient Name: [name] Date of Service: 04/11/2018 8:00 AM Medical Record Number: [id number] Patient Account Number: [id number] Date of Birth/Sex: 14-Feb-1961 (57 y.o. F) Treating RN: Huel Coventry Primary Care Provider: Joen [name] Other Clinician: Referring Provider: Joen [name] Treating Provider/Extender: Altamese [place] in Treatment: 30 Subjective History of Present Illness (HPI) 57 year old patient who was seen by visiting [name] wound care specialist for a wound on both her buttock and was found to have an unstageable wound on the right buttock for about 2 months. I understand that she had a fall and was laying on the floor for about 48 hours before she was found and taken to the ICU and had a long injury to her gluteal area from pressure and also had broken her right humerus. She has had a right proximal humerus fracture and has been followed up with orthopedics recently. The patient has a past medical history of type 2 diabetes mellitus, paraparesis, acute pyelonephritis, GERD, hypertension, glaucoma, chronic pain, anxiety neurosis, nicotine dependence, COPD. the patient had some debridement done and was to operative  was recommended to use Silvadene dressing and  offloading. She is a smoker and occasionally smokes a few cigarettes. the patient requested a second opinion for months and is here to discuss her care. 56/27/17; the patient re-presents from home today for review of 3 different wounds. I note that she was seen in the clinic here in July at which time she had bilateral buttock wounds. It was apparently suggested at that time that she use a wound VAC bridged to both wounds just near the initial tuberosity's bilaterally which she refused. The history was a bit difficult to put together. Apparently this patient became ill at the end of April of this year. She was found sitting on the floor she had apparently been for 2 days and subsequently admitted to hospital from 04/23/16 through 05/02/16 and at that point she was critically ill ultimately having sepsis secondary to UTI, nontraumatic rhabdomyolysis and diabetic ketoacidosis. She had acute renal failure and I think required ICU care including intubation. Patient states her wounds actually started at that point on the bilateral issue tuberosities however in reviewing the discharge summary from 5/8 I see no reference to wounds at that point. It did state that she had left lower extremity cellulitis however. Reviewing Epic I see no relevant x-rays. It would appear that her discharge creatinine was within the normal range and indeed on 9/15 her creatinine has remained normal. [facility] There the wounds on her bilateral Buttocks were dressed. Only just before her discharge from the nursing facility she developed an "knot" which was interpreted as cellulitis on the posterior aspect of her left knee she was given antibiotics. Apparently sometime late in July a this actually opened and became a wound at home health care was tending to however she is still having purulent drainage coming from this and by my understanding the wound depth is actually  become unmeasurable. I am not really clear about what home health has been placing in any of these wound areas. The patient states that is something with silver and it. She is not been systemically unwell no fever or chills her appetite is good. She is a diabetic poorly controlled however she states that her recent blood sugars at home have been in the low to mid 100s. 09/28/16 On evaluation today patient appears to continue to exhibit the 3 areas of ulceration that were noted previous. She did have an x-ray of the right pelvis which showed evidence of potential soft tissue infection but no obvious osteomyelitis. There was a discussion last office visit concerning the possibility of a wound VAC. Witth that being said the x-ray report suggested that an MRI may be more appropriate to further evaluate the extent. Subsequently in regard to the wound over the popliteal portion of the left lower extremity with tunneling at 12:00 the CT scan that was ordered was denied by insurance as they state the patient has not had x-rays prior to advanced imaging. Patient states that she is frustrated with the situation overall. 10/05/16 in the interval since I last saw this patient last week she has had the x-ray of the knee performed. I did review that x-ray today and fortunately shows no evidence of osteomyelitis or other acute abnormality at this point in time. She continues to have the opening iin the posterior left popliteal space with tracking proximal up the posterior thigh. Nothing seems to have worsened but it also seems to have not improved. The same  is true in regard to the right pressure ulcer over the gluteal region which extends toward the ischium. The left gluteal pressure ulcer actually appears to be doing somewhat better my opinion there is some necrotic slough but overall this appears fairly well. She tells me thatt she has some discomfort 57 especially when home health is helping her with dressing  changes as they do not know her. At worse she rates her pain to be a 5 out of 10 right now it's more like a 1 out of 10. [name], [name] ([id number]) 10/07/16; still the patient has 57 3 different wound areas. She has a deep stage IV wound over her right ischial tuberosity. She is due to have an MRI next week. The wound over her left ischial tuberosity is more superficial and underwent debridement today. Finally she has a small open area in her left popliteal fossa the probes on measurably forward superiorly. Still a lot of drainage coming out of this. The CT scan that I ordered 3 weeks ago was questioned by her insurance company wanting a plain x-ray first. As I understand things result of this is nothing has been done in 3 weeks in terms of imaging the thigh and she has an MRI booked of this along with her pelvis for next week line 10/12/16; patient has a deep probing wound over the right ischiall tuberosity, stage III wound over the left visual tuberosity and a draining sinus in her left popliteal fossa. None of this much different from when I saw this 3 weeks ago. We have been using silver rope to the right ischial wound and a draining area in the left popliteal fossa. Plain silver alginate to the area on the left ischial tuberosity 10/19/16; the patient's wounds are essentially unchanged although the area on the left lower gluteal is actually improved. Our intake nurse noted drainage from the right initial tuberosity probing wound as well as the draining area in the left popliteal fossa. Both of these were cultured. She had x-rays I think at the insistence of her insurance company on 09/23/16 x-ray of the pelvis was not particularly helpful she did have soft tissue air over the right lower pelvis although with the depth of this wound this is not surprising. An x-ray of her left knee did not show any specific abnormalities. We are still using silver alginate to these wound areas. Her MRI is booked  for 10/27 10/26/16; cultures of the purulent drainage in her right initial tuberosity wound grew moderate Proteus and few staph aureus. The same organisms were cultured out of the left knee sinus tract posteriorly. The staph aureus is MRSA. I had started her on Augmentin last week I added doxycycline. The MRI of the left lower extremity and pelvis was finally done. The MRI of the femur showed subcutaneous soft tissue swelling edema fluid and myositis in the vastus lateralis muscle but no soft tissue abscess septic arthritis or osteomyelitis. MRI of the pelvis showed the left wound to be more expensive extending down to the bone there was osteomyelitis. Left hamstring tendons were also involved. No septic arthritis involving the hip. The decubitus ulcer on the right side showed no definite osteomyelitis or abscess.. The right hip wound is actually the one the probes 6 cm downward. But the MRI showing infection including osteomyelitis on the left explains the draining sinus in the popliteal fossa on the left. She did have antibiotics in the hospitalization last time and this extended into her nursing home stay but  I'm not exactly sure what antibiotics and for what duration. According the patient this did include vancomycin with considerable effort of our staff we are able to get the patient into see Dr. Sampson Goon today. There were transportation difficulties. Her mother had open heart surgery and is in the ICU in [place] therefore her brother was unable to transport. Dr. Jarrett Ables office graciously arranged time to see her today. From my point of view she is going to require IV vancomycin plus perhaps a third generation cephalosporin. I plan to keep her on doxycycline and Augmentin until the IV antibiotics can be arranged. 11/02/2016 - Jesscia presents today for management of ulcers; She saw Dr. 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 She is to have the PICC line placed today and receive the initial dose for both antibiotics today. She has yet to receive the offloading chair cushion and/or mattress overlay from home health, apparently this has been a 3 week process. I encouraged her to speak to home health regarding this matter, along with offering home health to contact the wouns care center with any questions or concerns. The left ischial pressure ulcer continues to imporve, while to right ischial ulcer has increased in depth. The popliteal fossa sinus tract remains unmeasurable due to the limitation of depth measurement (tract extends beyond our measuring devices). 11-16-2016 Ms. Kitner presents today for evaluation and management of bilateral ischial stage IV pressure ulcers and sinus tract to the left popliteal fossa. she is under the care of Dr. Sampson Goon for IV antibiotic therapy; she states that the vancomycin was placed on hold and will be restarted at a lower dose based on her renal function. She continues taking Zosyn in addition to the vancomycin. She also states that she has yet to receive offloading cushions from home health, according to her she does not qualify for these offloading cushions because "the ulcers are unstageable ". We will contact the home health agency today to lend clarity regarding her pressure ulcers. The left popliteal fossa sinus tract continues to be a measurable as it extends beyond the length of our measuring devices.. 11/30/16; the patient is still on vancomycin and Zosyn. The depth of the draining sinus behind her left popliteal fossa is down to 4 cm although there is still serosanguineous drainage coming out of this. She saw Dr. Sampson Goon of infectious disease yesterday the idea is to weeks more of IV antibiotics and then oral antibiotics although I have not read his note. The area on the left gluteal fold is just about healed. She has a 6 cm draining  sinus over the right initial tuberosity although I cannot feel bone at the base of this. As far as the patient is aware she has not had a recheck of her inflammatory markers. 12/07/16; patient is on vancomycin and Zosyn appointment with Dr. Sampson Goon on the 19th at which point the patient expects to have a change in antibiotics. Remarkable improvement over the wound over the left ischial tuberosity which is just about closed. The draining sinus in her popliteal fossa has 0.4 cm in depth. The area on the right ischial tuberosity still probes down 7 cm. This is closed and overall wound dimensions but not depth. 12/14/16; patient is completing her vancomycin and Zosyn and per her she is going to transition to Bactrim and Augmentin for another 3 weeks. The area on her left gluteal fold is closed except for some skin  tears. The area behind her left knee is no [name], [name] ([id number]) longer has any depth. The only remaining area that is of clinical concern is on the right gluteal fold probing towards the right ischial tuberosity. Today this measures 6.9 cm in depth. Very gritty surface 12/21/16; patient is now on Bactrim and Augmentin as directed by infectious disease. This should be for another 2 weeks. The area in her left gluteal fold and left popliteal are closed over and fully healed. Measurements today at 7 cm in the right buttock wound is unchanged from last week. 12/27/16; patient is on Bactrim and Augmentin for another week as directed by infectious disease. She has completed her IV antibiotics. She is not been systemically unwell no fever no chills. The area on her right buttock measured over 6 cm in depth. There is no palpable bone. No evidence of surrounding soft tissue infection. She is complaining of tongue irritation and has a history of thrush 01/04/17; patient is been back to see Dr. Sampson Goon, her Augmentin was stopped but he continued the Bactrim for another 3 weeks. Depth of the  wound is 6.7 cm there is been no major change in either direction. She is not receive the wound VAC from home health I think because of confusion about who is supposed to provided will actually talk to the home health agency today [[facility] The net no major change. 01/18/17; patient obtained her wound VAC about 10 days ago however for some reason it was not actually put on the wound. She is therefore here for [place] to apply this I guess. No other issues are noted. She is not complaining of pain fever drainage 02/01/17; patient is here now having the wound VAC or 3-4 weeks to a deep pressure area over the right initial tuberosity. This measures 6 cm in depth today which is about half a centimeter better than 2 weeks ago. There is no evidence she is systemically unwell no fever no chills no pain around the area. 02/15/17; I follow this patient every 2 weeks for a deep area over the right ischial tuberosity. This measures 5.5 cm today which is a continued improvement of 1.2 cm from 1/10 and down 0.5 cm from her visit 2 weeks ago 03/01/17; continued difficult area over the right initial tuberosity using the wound VAC. Depth today of 5.1 cm which is improved. Does not appear to be a lot of drainage in the canister. Antibiotics were finally stopped by Dr. Sampson Goon bactrim[]  and inflammatory markers have been repeated 03/15/17; fall this lady every 2 weeks for a difficult area over her lower right gluteal area/ischial tuberosity. Depth today at 4.6 cm. This is a slow but steady improvement in the depth of this wound. Although we have labeled this as a pressure ulcer there may have been an underlying infection here at one point before we saw her. She had osteomyelitis on the left extensively which is since resolved 03/29/17- patient is here for follow-up evaluation of her right ischial pressure ulcer. She continues with the wound VAC and home health. According to the nurse home health has been using less foams and  appropriate and we will instruct accordingly. The patient continues to smoke, approximately 10 cigarettes a day. She has been advised to decrease that in half by her next appointment, with a goal of complete cessation. She states her blood sugars have been consistently less than 150. She states that she spends most of her day position left lateral or prone. She  does have an air mattress on her bed, she does not have an mattress for her chair, she states she cannot afford this. 04/12/17; patient is here for evaluation of her right ischial pressure ulcer. We continue to use a wound VAC with minimal improvement today the depth of this measuring 4.4 cm versus 4.6 cm 2 weeks ago. We are using a [employer] wound VAC on this area. There is not excessive drainage no pain. The patient tells me she tries to keep off this in bed but is up in the wheelchair for 2 hours a day. She is limited in no her overall ambulation but is improving and apparently is getting bilateral lower extremity braces which she hopes will improve her ability to walk independently. 05/10/17; Depth at 3.3cm. Improved 05/24/17; depth at 3.5 cm. This is not improved since last time. Not clear if they are using collagen under the foam 06/07/17; depth that 2.9 which is a slight improvement. Still using the wound VAC. 06/21/17; depth is 2.9 cm which is exactly the same as last time. Also the appearance of this wound is completely the same. We have been using silver collagen under a wound VAC. 07/05/17 patient presents today for reevaluation concerning her right Ischial wound. She had switched insurance companies and so it does appear that the Wound VAC needs to be reauthorized which we are working on this morning. Nonetheless her wound has been saying about the same we have continued to use the silver collagen underneath the wound VAC. She has no discomfort. 07/19/17; patient follows every 2 weeks for her right if she'll wound. Since I last saw this a  month ago her dimensions of come down to 2.6 cm. This is slightly down from a month ago when it was 2.9 cm in 4 months ago it was 4.6 cm. I note that there were insurance company issues with regard to the wound VAC which she apparently is not having on for several weeks now. I think it would be reasonable to change therapies here. Will use silver alginate rope 08/02/17 on evaluation today patient's wound actually appears to be doing significantly better in regard to the depth as compared to her previous evaluation. She is having no discomfort. Unfortunately she never got the Wound VAC reapproved following the insurance issues from several weeks ago. With that being said it does not appear that she needs to requires the Wound VAC anymore and in fact I feel that she is doing better and making greater improvements at this point without it. Fortunately she has no nausea, vomiting, diarrhea, fevers, or chills. 08/16/17; patient's wound depth down to 2.3 cm. She is using silver alginate packing rope. Note that her wound VAC was discontinued due to insurance issues. This is not particularly surprising. She has not been systemically unwell and has no other new complaints [name], [name] ([id number]) 08/30/17; no change in depth. Still at 2.3 cm. Still with the same gritty surface requiring debridement. I've been using silver alginate for quite a period of time although this came down nicely in the last month 10/04/17; the depth of this is 2 cm however with careful inspection under high-intensity light most of the walls of this small probing sinus seemed to be normally epithelialized. I cannot exactly see the base of this however there is been no drainage. We've been using silver alginate there is no drainage on the dressing when it is removed. 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 There is  no evidence of surrounding infection or pain. The patient had many review  weakness in her bilateral legs. She is already been to see a neurologist who according the patient told her "you would never walk again" 11/01/17; this is a patient I last saw a month ago. At that point the linear tunnel in her right buttock was 2 cm. It was not possible to see the depth of this as skin had grown into the tunnel. In light of the fact that there was no drainage and no visible wound I recommended that we just allow her to go about her usual activity without dressing. She reports that almost immediately she noted drainage on her clothing although in spite of her instructions the contrary she did not come back to the clinic. She has not been specifically addressing this and is continued to no drainage without other symptoms either local or systemic 11/29/17; I follow this woman monthly. Last time the wound on her right buttock was 1.8 cm today measuring at 1.4 there has been gradual improvement in this. Concerning is the fact that the patient says that Dr. Sampson Goon of infectious disease changed her from doxycycline to Augmentin apparently because of the elevated inflammatory markers. She also said he did a culture of this area but she is not heard the results. I don't think I have his information available on care everywhere however I will check this. She is using Hydrofera Blue rope. The patient and her intake nurse report that she is still having identifiable drainage which certainly makes it clear that this is not closed. 01/02/17 on evaluation today patient appears to be doing fairly well in regard to her left gluteal wound. She has been continuing with the [facility] Dressing's here in our office. We actually see her once a month and then subsequently are performing weekly dressing changes with Hydrofera Blue Dressing rope during nurse visits one time a week. Today there really was not much drainage at this point. However this has happened previously where she had no drainage and  was essentially thought to be close internally although the external had not completely pulled together. However then she reopened and began draining again. He has been mentioned the possibility of her seeing plastic surgery to try to get this finally and completely healed. 01/18/18 she is here in follow-up evaluation for right ischial pressure ulcer. She has an appointment with [facility] plastic surgery on 2/8 for evaluation. She continues to have minimal drainage at the tip of the dressing product. She continues to smoke, admits to 8-10 cigarettes per day. No change in treatment plan 02/06/18 on evaluation today patient appears to be doing about the same in regard to her right Ischial wound. She has been tolerating the dressing changes without complication. She did have her appointment at [facility] plastic surgery she was not really impressed. They stated that due to her weight they were unable to perform a flap and subsequently recommended the only thing they could attempt would be to exercise around the ulcer and then attempt to suture it together. The question is whether this would be effective or not. Patient really is not wanting to proceed down that road. Nonetheless she really have not changed much in regard to her wound measurements. 03/06/18 on evaluation today patient states that last week when I perform the silver nitrate that she actually had some burning pain for about two days following. Since that time the area has been very dry and she states that it  is also been very dark and scabbed around the area. With that being said other than this she feels that the hope was the fact that it was burning was a good sign and that they would be improvement but the really does not appear to be significant improvement at this point. We have tried a lot of different dressings for her most recently Hydrofera Blue Dressing, silver alginate rope, and collagen. We have never attempted Endoform which I think could  be a possibility for her. 03/22/18-She is here for a follow-up evaluation of her right ischial pressure ulcer. There is no drainage on her dressings and what appears to be epidermal debris in the ulcer; she has no pain. The ulcer was cleanse with betadine, I believe it is healed, despite remaining a  1" depth. We will place a dry dressing over and evaluate next week for drainage, if no drainage she will be discharged at that time. 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 She underwent a prolonged course of IV antibiotics as directed by Dr. Sampson Goon. The tunneling right ischial pressure ulcer as eventually closed over. We've been following this over the last month or so with no identifiable area that was open. We've been using Endoform and hydrogel. She rising clinic today, our intake nurse reported some dampness. [name], [name] ([id number]) Objective Constitutional Sitting or standing Blood Pressure is within target range for patient.. Pulse regular and within target range for patient.Marland Kitchen Respirations regular, non-labored and within target range.. Temperature is normal and within the target range for the patient.Marland Kitchen appears in no distress. Vitals Time Taken: 8:09 AM, Height: 63 in, Weight: 257 lbs, BMI: 45.5, Temperature: 98.3 F, Pulse: 88 bpm, Respiratory Rate: 18 breaths/min, Blood Pressure: 145/82 mmHg. General Notes: wound exam; the patient's wound area has some depth. I probed this with a Q-tip and there is no dampness the Q-tip was dry. There is no visible surface that you can determine absolutely that there was no opening remaining. There is clearly some depth to this however I think all of it may be epithelialized. Integumentary (Hair, Skin) Wound #3 status is Healed -  Epithelialized. Original cause of wound was Pressure Injury. The wound is located on the Right Gluteal fold. The wound measures 0cm length x 0cm width x 0cm depth; 0cm^2 area and 0cm^3 volume. There is Fat Layer (Subcutaneous Tissue) Exposed exposed. There is no tunneling or undermining noted. There is a none present amount of drainage noted. The wound margin is distinct with the outline attached to the wound base. There is no granulation within the wound bed. There is no necrotic tissue within the wound bed. The periwound skin appearance did not exhibit: Callus, Crepitus, Excoriation, Induration, Rash, Scarring, Dry/Scaly, Maceration, Atrophie Blanche, Cyanosis, Ecchymosis, Hemosiderin Staining, Mottled, Pallor, Rubor, Erythema. Periwound temperature was noted as No Abnormality. The periwound has tenderness on palpation. 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 Skin Barriers/Peri-Wound Care: Wound #3 Right Gluteal fold: Skin Prep Primary Wound Dressing: Wound #3 Right Gluteal fold: Other: - Bordered Foam Dressing Dressing Change Frequency: Osoria, Corynne L. ([id number]) Wound #  3 Right Gluteal fold: Other: - as needed for protection Off-Loading: Wound #3 Right Gluteal fold: Turn and reposition every 2 hours Additional Orders / Instructions: Wound #3 Right Gluteal fold: Increase protein intake. Discharge From [facility]: Discharge from [facility] Center - treatment complete #1 right gluteal fold at one point a stage IV pressure wound to the ischial tuberosity. as far as I can see reviewing her imaging from 2017 she definitely had osteomyelitis in the right ischial tuberosity I don't think that was ever shown on the left nevertheless was a very deep wound that  had to close and gradually over time. She has been left with a divot however as far as I can tell this is dry and I think the entire divot is epithelialized. #2 I don't think there is any specific dressing that she can put on this now. She'll need to keep the area cleaned out with peroxide rubbing alcohol etc. #3 I think she can be discharged from the clinic at this point. She'll call us if there is anything that looks ominous i.e. bleeding, purulence etc. Electronic Signature(s) Signed: 04/11/2018 5:09:03 PM By: Baltazar Najjar MD Entered By: Baltazar Najjar on 04/11/2018 08:57:34 Plaza, [name] ([id number]) -------------------------------------------------------------------------------- SuperBill Details Patient Name: [name] Date of Service: 04/11/2018 Medical Record Number: [id number] Patient Account Number: [id number] Date of Birth/Sex: 08-23-1961 (57 y.o. F) Treating RN: Huel Coventry Primary Care Provider: Joen [name] Other Clinician: Referring Provider: Joen [name] Treating Provider/Extender: Altamese  in Treatment: 81 Diagnosis Coding ICD-10 Codes Code Description L89.314 Pressure ulcer of right buttock, stage 4 E11.42 Type 2 diabetes mellitus with diabetic polyneuropathy Facility Procedures CPT4 Code: [id number] Description: [phone] - WOUND CARE VISIT-LEV 2 EST PT Modifier: Quantity: 1 Physician Procedures CPT4 Code: [id number] Description: 99212 - WC PHYS LEVEL 2 - EST PT ICD-10 Diagnosis Description L89.314 Pressure ulcer of right buttock, stage 4 Modifier: Quantity: 1 Electronic Signature(s) Signed: 04/11/2018 5:09:03 PM By: Baltazar Najjar MD Entered By: Baltazar Najjar on 04/11/2018 08:57:54

## 2018-04-18 NOTE — Progress Notes (Signed)
MCKINLY, COPPENS ([id number]) Visit Report for 04/11/2018 Arrival Information Details Patient Name: Katelyn, [name] Date of Service: 04/11/2018 8:00 AM Medical Record Number: [id number] Patient Account Number: [id number] Date of Birth/Sex: January 03, 1961 (57 y.o. F) Treating RN: Phillis Haggis Primary Care Shandiin Eisenbeis: Joen [name] Other Clinician: Referring Deasia Chiu: Joen [name] Treating Jalesia Loudenslager/Extender: Altamese [place] in Treatment: 59 Visit Information History Since Last Visit All ordered tests and consults were completed: No Patient Arrived: Wheel Chair Added or deleted any medications: No Arrival Time: 08:08 Any new allergies or adverse reactions: No Accompanied By: self Had a fall or experienced change in No Transfer Assistance: EasyPivot Patient activities of daily living that may affect Lift risk of falls: Patient Identification Verified: Yes Signs or symptoms of abuse/neglect since last visito No Secondary Verification Process Yes Hospitalized since last visit: No Completed: Implantable device outside of the clinic excluding No Patient Requires Transmission-Based No cellular tissue based products placed in the center Precautions: since last visit: Patient Has Alerts: Yes Has Dressing in Place as Prescribed: Yes Patient Alerts: DM II Pain Present Now: No Electronic Signature(s) Signed: 04/12/2018 5:13:37 PM By: Alejandro Mulling Entered By: Alejandro Mulling on 04/11/2018 08:09:20 Sandner, [name] ([id number]) -------------------------------------------------------------------------------- Clinic Level of Care Assessment Details Patient Name: [name] Date of Service: 04/11/2018 8:00 AM Medical Record Number: [id number] Patient Account Number: [id number] Date of Birth/Sex: 1960/12/31 (57 y.o. F) Treating RN: Huel Coventry Primary Care Dylyn Mclaren: Joen [name] Other Clinician: Referring Aoki Wedemeyer: Joen [name] Treating Sarenity Ramaker/Extender: Altamese [place] in  Treatment: 22 Clinic Level of Care Assessment Items TOOL 4 Quantity Score []  - Use when only an EandM is performed on FOLLOW-UP visit 0 ASSESSMENTS - Nursing Assessment / Reassessment []  - Reassessment of Co-morbidities (includes updates in patient status) 0 X- 1 5 Reassessment of Adherence to Treatment Plan ASSESSMENTS - Wound and Skin Assessment / Reassessment X - Simple Wound Assessment / Reassessment - one wound 1 5 []  - 0 Complex Wound Assessment / Reassessment - multiple wounds []  - 0 Dermatologic / Skin Assessment (not related to wound area) ASSESSMENTS - Focused Assessment []  - Circumferential Edema Measurements - multi extremities 0 []  - 0 Nutritional Assessment / Counseling / Intervention []  - 0 Lower Extremity Assessment (monofilament, tuning fork, pulses) []  - 0 Peripheral Arterial Disease Assessment (using hand held doppler) ASSESSMENTS - Ostomy and/or Continence Assessment and Care []  - Incontinence Assessment and Management 0 []  - 0 Ostomy Care Assessment and Management (repouching, etc.) PROCESS - Coordination of Care X - Simple Patient / Family Education for ongoing care 1 15 []  - 0 Complex (extensive) Patient / Family Education for ongoing care []  - 0 Staff obtains Chiropractor, Records, Test Results / Process Orders []  - 0 Staff telephones HHA, Nursing Homes / Clarify orders / etc []  - 0 Routine Transfer to another Facility (non-emergent condition) []  - 0 Routine Hospital Admission (non-emergent condition) []  - 0 New Admissions / Manufacturing engineer / Ordering NPWT, Apligraf, etc. []  - 0 Emergency Hospital Admission (emergent condition) X- 1 10 Simple Discharge Coordination [name], [name] ([id number]) []  - 0 Complex (extensive) Discharge Coordination PROCESS - Special Needs []  - Pediatric / Minor Patient Management 0 []  - 0 Isolation Patient Management []  - 0 Hearing / Language / Visual special needs []  - 0 Assessment of Community assistance  (transportation, D/C planning, etc.) []  - 0 Additional assistance / Altered mentation []  - 0 Support Surface(s) Assessment (bed, cushion, seat, etc.) INTERVENTIONS - Wound Cleansing / Measurement X -  Simple Wound Cleansing - one wound 1 5 []  - 0 Complex Wound Cleansing - multiple wounds X- 1 5 Wound Imaging (photographs - any number of wounds) []  - 0 Wound Tracing (instead of photographs) X- 1 5 Simple Wound Measurement - one wound []  - 0 Complex Wound Measurement - multiple wounds INTERVENTIONS - Wound Dressings X - Small Wound Dressing one or multiple wounds 1 10 []  - 0 Medium Wound Dressing one or multiple wounds []  - 0 Large Wound Dressing one or multiple wounds []  - 0 Application of Medications - topical []  - 0 Application of Medications - injection INTERVENTIONS - Miscellaneous []  - External ear exam 0 []  - 0 Specimen Collection (cultures, biopsies, blood, body fluids, etc.) []  - 0 Specimen(s) / Culture(s) sent or taken to Lab for analysis []  - 0 Patient Transfer (multiple staff / / Similar devices) []  - 0 Simple Staple / Suture removal (25 or less) []  - 0 Complex Staple / Suture removal (26 or more) []  - 0 Hypo / Hyperglycemic Management (close monitor of Blood Glucose) []  - 0 Ankle / Brachial Index (ABI) - do not check if billed separately X- 1 5 Vital Signs [name], [name] ( ) Has the patient been seen at the hospital within the last three years: Yes Total Score: 65 Level Of Care: New/Established - Level 2 Electronic Signature(s) Signed: 04/11/2018 5:11:52 PM By: , BSN, RN, CWS, Kim RN, BSN Entered By: , BSN, RN, CWS, Kim on 04/11/2018 08:36:25 [name], ( ) -------------------------------------------------------------------------------- Encounter Discharge Information Details Patient Name: Date of Service: 04/11/2018 8:00 AM Medical Record Number: Patient Account Number: Nurse, adult Date  of Birth/Sex: [birth year] (57 y.o. F) Treating RN: Primary Care Hajira Verhagen: Other Clinician: Referring Nolawi Kanady: [phone] Treating Minetta Krisher/Extender: 04/13/2018 in Treatment: 31 Encounter Discharge Information Items Discharge Pain Level: 0 Discharge Condition: Stable Ambulatory Status: Wheelchair Discharge Destination: Home Transportation: Private Auto Accompanied By: self Schedule Follow-up Appointment: Yes Medication Reconciliation completed and No provided to Patient/Care Matix Henshaw: Provided on Clinical Summary of Care: 04/11/2018 Form Type Recipient Paper Patient TW Electronic Signature(s) Signed: 04/13/2018 3:53:00 PM By: [name] Entered By: [id number] on 04/11/2018 08:42:06 Diven, 04/13/2018 ([id number]) -------------------------------------------------------------------------------- Lower Extremity Assessment Details Patient Name: [id number] Date of Service: 04/11/2018 8:00 AM Medical Record Number: [id number] Patient Account Number: Huel Coventry Date of Birth/Sex: 01/10/1961 (57 y.o. F) Treating RN: Altamese [place] Primary Care Embry Huss: 94 Other Clinician: Referring Belal Scallon: 04/13/2018 Treating Loan Oguin/Extender: 04/15/2018 Weeks in Treatment: 33 Electronic Signature(s) Signed: 04/12/2018 5:13:37 PM By: 04/13/2018 Entered By: [name] on 04/11/2018 08:19:28 Pienta, Jezlyn [name] (04/13/2018) -------------------------------------------------------------------------------- Multi Wound Chart Details Patient Name: [id number] Date of Service: 04/11/2018 8:00 AM Medical Record Number: 03/27/[id number] Patient Account Number: [id number] Date of Birth/Sex: Jun 02, 1961 (57 y.o. F) Treating RN: Joen [name] Primary Care Amijah Timothy: Maxwell Caul Other Clinician: Referring Dorrance Sellick: 94 Treating Raymondo Garcialopez/Extender: 04/14/2018 in Treatment: 81 Vital Signs Height(in): 63 Pulse(bpm):  88 Weight(lbs): 257 Blood Pressure(mmHg): 145/82 Body Mass Index(BMI): 46 Temperature(F): 98.3 Respiratory Rate 18 (breaths/min): Photos: [3:No Photos] [N/A:N/A] Wound Location: [3:Right Gluteal fold] [N/A:N/A] Wounding Event: [3:Pressure Injury] [N/A:N/A] Primary Etiology: [3:Pressure Ulcer] [N/A:N/A] Comorbid History: [3:Asthma, Hypertension, Type II Diabetes, Neuropathy] [N/A:N/A] Date Acquired: [3:04/21/2016] [N/A:N/A] Weeks of Treatment: [3:81] [N/A:N/A] Wound Status: [3:Healed - Epithelialized] [N/A:N/A] Measurements L x W x D [3:0x0x0] [N/A:N/A] (cm) Area (cm) : [3:0] [N/A:N/A] Volume (cm) : [3:0] [  N/A:N/A] % Reduction in Area: [3:94.90%] [N/A:N/A] % Reduction in Volume: [3:98.30%] [N/A:N/A] Classification: [3:Category/Stage IV] [N/A:N/A] Exudate Amount: [3:None Present] [N/A:N/A] Wound Margin: [3:Distinct, outline attached] [N/A:N/A] Granulation Amount: [3:None Present (0%)] [N/A:N/A] Necrotic Amount: [3:None Present (0%)] [N/A:N/A] Exposed Structures: [3:Fat Layer (Subcutaneous Tissue) Exposed: Yes Fascia: No Tendon: No Muscle: No Joint: No Bone: No] [N/A:N/A] Epithelialization: [3:Large (67-100%)] [N/A:N/A] Periwound Skin Texture: [3:Excoriation: No Induration: No Callus: No Crepitus: No Rash: No Scarring: No] [N/A:N/A] Periwound Skin Moisture: [3:Maceration: No Dry/Scaly: No] [N/A:N/A] Periwound Skin Color: [N/A:N/A] Atrophie Blanche: No Cyanosis: No Ecchymosis: No Erythema: No Hemosiderin Staining: No Mottled: No Pallor: No Rubor: No Temperature: No Abnormality N/A N/A Tenderness on Palpation: Yes N/A N/A Wound Preparation: Ulcer Cleansing: N/A N/A Rinsed/Irrigated with Saline Topical Anesthetic Applied: Other: lidocaine 4% Assessment Notes: wound is damp inside N/A N/A Treatment Notes Electronic Signature(s) Signed: 04/11/2018 5:09:03 PM By: Baltazar Najjar MD Entered By: Baltazar Najjar on 04/11/2018 08:46:46 Beck, [name]  ([id number]) -------------------------------------------------------------------------------- Multi-Disciplinary Care Plan Details Patient Name: [name] Date of Service: 04/11/2018 8:00 AM Medical Record Number: [id number] Patient Account Number: [id number] Date of Birth/Sex: 05-06-1961 (57 y.o. F) Treating RN: Huel Coventry Primary Care Cherlynn Popiel: Joen [name] Other Clinician: Referring Lina Hitch: Joen [name] Treating Bram Hottel/Extender: Altamese [place] in Treatment: 70 Active Inactive Electronic Signature(s) Signed: 04/12/2018 11:32:40 AM By: Elliot Gurney, BSN, RN, CWS, Kim RN, BSN Previous Signature: 04/11/2018 5:11:52 PM Version By: Elliot Gurney, BSN, RN, CWS, Kim RN, BSN Entered By: Elliot Gurney, BSN, RN, CWS, Kim on 04/12/2018 11:32:38 Letts, [name] ([id number]) -------------------------------------------------------------------------------- Pain Assessment Details Patient Name: [name] Date of Service: 04/11/2018 8:00 AM Medical Record Number: [id number] Patient Account Number: [id number] Date of Birth/Sex: 1961-01-02 (57 y.o. F) Treating RN: Phillis Haggis Primary Care Gurtej Noyola: Joen [name] Other Clinician: Referring Marcelia Petersen: Joen [name] Treating Montrelle Eddings/Extender: Altamese [place] in Treatment: 21 Active Problems Location of Pain Severity and Description of Pain Patient Has Paino No Site Locations Pain Management and Medication Current Pain Management: Electronic Signature(s) Signed: 04/12/2018 5:13:37 PM By: Alejandro Mulling Entered By: Alejandro Mulling on 04/11/2018 08:09:27 [place], [name] ([id number]) -------------------------------------------------------------------------------- Patient/Caregiver Education Details Patient Name: [name] Date of Service: 04/11/2018 8:00 AM Medical Record Number: [id number] Patient Account Number: [id number] Date of Birth/Gender: 1961/05/11 (57 y.o. F) Treating RN: Huel Coventry Primary Care Physician: Joen [name] Other  Clinician: Referring Physician: Joen [name] Treating Physician/Extender: Altamese [place] in Treatment: 62 Education Assessment Education Provided To: Patient Education Topics Provided Psychologist, prison and probation services) Signed: 04/11/2018 5:11:52 PM By: Elliot Gurney, BSN, RN, CWS, Kim RN, BSN Entered By: Elliot Gurney, BSN, RN, CWS, Kim on 04/11/2018 08:39:21 Desha, [name] ([id number]) -------------------------------------------------------------------------------- Wound Assessment Details Patient Name: [name] Date of Service: 04/11/2018 8:00 AM Medical Record Number: [id number] Patient Account Number: [id number] Date of Birth/Sex: [birth year] (57 y.o. F) Treating RN: Phillis Haggis Primary Care Adaleah Forget: Joen [name] Other Clinician: Referring Reeve Turnley: Joen [name] Treating Dorinne Graeff/Extender: Altamese [place] in Treatment: 81 Wound Status Wound Number: 3 Primary Pressure Ulcer Etiology: Wound Location: Right Gluteal fold Wound Status: Healed - Epithelialized Wounding Event: Pressure Injury Comorbid Asthma, Hypertension, Type II Diabetes, Date Acquired: 04/21/2016 History: Neuropathy Weeks Of Treatment: 81 Clustered Wound: No Photos Photo Uploaded By: Renne Crigler on 04/11/2018 16:27:32 Wound Measurements Length: (cm) 0 % Width: (cm) 0 % Depth: (cm) 0 Ep Area: (cm) 0 T Volume: (cm) 0 U Reduction in Area: 94.9% Reduction in Volume: 98.3% ithelialization: Large (67-100%) unneling: No ndermining: No Wound Description Classification: Category/Stage  IV Wound Margin: Distinct, outline attached Exudate Amount: None Present Foul Odor After Cleansing: No Slough/Fibrino No Wound Bed Granulation Amount: None Present (0%) Exposed Structure Necrotic Amount: None Present (0%) Fascia Exposed: No Fat Layer (Subcutaneous Tissue) Exposed: Yes Tendon Exposed: No Muscle Exposed: No Joint Exposed: No Bone Exposed: No Periwound Skin Texture Texture Color No Abnormalities  Noted: No No Abnormalities Noted: No Zegarra, Haylea L. ([id number]) Callus: No Atrophie Blanche: No Crepitus: No Cyanosis: No Excoriation: No Ecchymosis: No Induration: No Erythema: No Rash: No Hemosiderin Staining: No Scarring: No Mottled: No Pallor: No Moisture Rubor: No No Abnormalities Noted: No Dry / Scaly: No Temperature / Pain Maceration: No Temperature: No Abnormality Tenderness on Palpation: Yes Wound Preparation Ulcer Cleansing: Rinsed/Irrigated with Saline Topical Anesthetic Applied: Other: lidocaine 4%, Assessment Notes wound is damp inside Electronic Signature(s) Signed: 04/11/2018 5:11:52 PM By: Elliot Gurney, BSN, RN, CWS, Kim RN, BSN Signed: 04/12/2018 5:13:37 PM By: Alejandro Mulling Entered By: Elliot Gurney BSN, RN, CWS, Kim on 04/11/2018 08:38:17 [place], [name] ([id number]) -------------------------------------------------------------------------------- Vitals Details Patient Name: [name] Date of Service: 04/11/2018 8:00 AM Medical Record Number: [id number] Patient Account Number: [id number] Date of Birth/Sex: [birth year] (57 y.o. F) Treating RN: Phillis Haggis Primary Care Stella Encarnacion: Joen [name] Other Clinician: Referring Onedia Vargus: Joen [name] Treating Laree Garron/Extender: Altamese [place] in Treatment: 81 Vital Signs Time Taken: 08:09 Temperature (F): 98.3 Height (in): 63 Pulse (bpm): 88 Weight (lbs): 257 Respiratory Rate (breaths/min): 18 Body Mass Index (BMI): 45.5 Blood Pressure (mmHg): 145/82 Reference Range: 80 - 120 mg / dl Electronic Signature(s) Signed: 04/12/2018 5:13:37 PM By: Alejandro Mulling Entered By: Alejandro Mulling on 04/11/2018 08:10:58

## 2018-04-19 ENCOUNTER — Ambulatory Visit: Payer: BLUE CROSS/BLUE SHIELD | Admitting: Nurse Practitioner

## 2018-05-02 ENCOUNTER — Ambulatory Visit: Payer: BLUE CROSS/BLUE SHIELD | Attending: Family Medicine

## 2018-05-02 DIAGNOSIS — R2689 Other abnormalities of gait and mobility: Secondary | ICD-10-CM | POA: Insufficient documentation

## 2018-05-02 DIAGNOSIS — M6281 Muscle weakness (generalized): Secondary | ICD-10-CM | POA: Diagnosis not present

## 2018-05-02 DIAGNOSIS — R262 Difficulty in walking, not elsewhere classified: Secondary | ICD-10-CM | POA: Insufficient documentation

## 2018-05-02 NOTE — Therapy (Signed)
[place] [facility] MAIN [facility] SERVICES [address] [place], [place], [address] Phone: [phone]   Fax:  [phone]  Physical Therapy Evaluation  Patient Details  Name: [name] MRN: [id number] Date of Birth: 08/14/1961 Referring Provider: Dr. Karenann Cai   Encounter Date: 05/02/2018  PT End of Session - 05/02/18 0828    Visit Number  1    Number of Visits  8    Date for PT Re-Evaluation  06/27/18    PT Start Time  0800    PT Stop Time  0844    PT Time Calculation (min)  44 min    Equipment Utilized During Treatment  Gait belt bilateral AFO's    Activity Tolerance  Patient tolerated treatment well;Patient limited by fatigue    Behavior During Therapy  [facility] for tasks assessed/performed       Past Medical History:  Diagnosis Date  . Acute pain of right shoulder 05/19/2016  . Acute PN (pyelonephritis) 05/18/2016  . Acute pyelonephritis 05/18/2016  . Anxiety   . Arthritis   . Asthma   . Diabetes mellitus without complication (HCC)   . GERD (gastroesophageal reflux disease)   . Glaucoma   . Hyperlipemia   . Hyperlipemia   . Hypertension     Past Surgical History:  Procedure Laterality Date  . ANTERIOR CRUCIATE LIGAMENT REPAIR      There were no vitals filed for this visit.   Subjective Assessment - 05/02/18 0805    Subjective  Patient returns to PT after hospitalization for spesis 4/3-4/10. Reports doing her HEP since hospitalization.     Pertinent History  Patient presented in manual wheelchair with bilateral AFOs. Was seen by this therapist prior to admittance to hospital on 03/28/18 for sepsis.  April of 2017 patient came home from work reported not feeling good, The next couple days was not feeling good. Went to doctor who called EMS to take her to the hospital. Was unable to get a number on her sugars initially due to patient not taking her medicine due to not feeling well and not eating. Sugar was read to be in the 700's,  also found multiple strains of a UTI/sepsis while there. Was in ICU for 12 days at [facility] then went to [facility]. While at [facility] was not walked or gotten up so after the fourth months there she returned home with in home therapy. Has not had any physical therapy since October. Gets around house with walker ok or in her wheelchair. Has pressure ulcer on Right buttocks with pmh including GERD, DM type II, HTN, and polyneuropathy. Patient wants to get stronger to be able to walk and stand again for more independence and to help her mother.      Limitations  Lifting;Standing;Walking;House hold activities;Other (comment)    How long can you sit comfortably?  hour    How long can you stand comfortably?  stand 5 minutes with walker     How long can you walk comfortably?  10 ft    Patient Stated Goals  walk better, want to drive, stand for longer, help her mom    Currently in Pain?  Yes    Pain Score  7     Pain Location  Leg    Pain Orientation  Right;Left    Pain Descriptors / Indicators  Tingling;Burning    Pain Type  Chronic pain;Neuropathic pain    Pain Onset  More than a month ago  Pain Frequency  Constant     ABC=8.4% LEFS= 24/80 : 58 seconds=.128m/s 5xSTS: 39 seconds with excessive UE support    Ambulate 30 ft with CGA.  Nustep Lvl 5 4 minutes           Objective measurements completed on examination: See above findings.  [facility] PT Assessment - 05/02/18 0001      Assessment   Medical Diagnosis  muscle weakness    Referring Provider  Dr. Karenann Cai    Onset Date/Surgical Date  03/31/16    Hand Dominance  Right    Next MD Visit  next month     Prior Therapy  yes home health, inpatient       Precautions   Precautions  Other (comment)    Required Braces or Orthoses  -- bilateral AFOs      Restrictions   Weight Bearing Restrictions  No      Balance Screen   Has the patient fallen in the past 6 months  No    Has the patient had a decrease in activity  level because of a fear of falling?   Yes    Is the patient reluctant to leave their home because of a fear of falling?   Yes      Home Environment   Living Environment  Private residence    Living Arrangements  Alone    Available Help at Discharge  Family;Friend(s)    Type of Home  House    Home Access  Ramped entrance    Home Layout  One level    Home Equipment  Walker - standard;Hospital bed;Grab bars - toilet;Wheelchair - manual      Prior Function   Level of Independence  Independent with household mobility with device    Vocation  On disability    Leisure  wants to adopt a dog, take care of mother       Cognition   Overall Cognitive Status  Within Functional Limits for tasks assessed      Observation/Other Assessments   Skin Integrity  pressure ulcer R buttocks      Sensation   Light Touch  Appears Intact      Coordination   Gross Motor Movements are Fluid and Coordinated  No    Coordination and Movement Description  limited by strength and body habitus       Posture/Postural Control   Posture/Postural Control  Postural limitations    Postural Limitations  Increased lumbar lordosis;Weight shift left;Flexed trunk      ROM / Strength   AROM / PROM / Strength  Strength      Strength   Strength Assessment Site  Hip;Knee    Right/Left Hip  Right;Left    Right Hip Flexion  4/5    Right Hip Extension  3/5    Right Hip ABduction  4-/5    Right Hip ADduction  4-/5    Left Hip Flexion  4-/5    Left Hip Extension  2+/5    Left Hip ABduction  3+/5    Left Hip ADduction  3+/5    Right/Left Knee  Right;Left    Right Knee Flexion  4-/5    Right Knee Extension  4/5    Left Knee Flexion  3+/5    Left Knee Extension  4-/5      Flexibility   Soft Tissue Assessment /Muscle Length  yes    Quadriceps  Tight illipsoas noted in standing bilaterally  Transfers   Transfers  Sit to Stand;Stand to Sit    Sit to Stand  4: Min guard;With upper extremity assist    Five time  sit to stand comments   39 seconds with UE support     Stand to Sit  4: Min guard;With upper extremity assist      Ambulation/Gait   Ambulation/Gait  Yes    Ambulation/Gait Assistance  4: Min guard    Ambulation Distance (Feet)  30 Feet    Assistive device  Standard walker    Gait Pattern  Step-through pattern;Decreased stride length;Decreased dorsiflexion - right;Decreased dorsiflexion - left;Shuffle;Narrow base of support;Trunk flexed    Ambulation Surface  Level;Indoor    Gait velocity  .56m/s      Balance   Balance Assessed  Yes      Static Sitting Balance   Static Sitting - Balance Support  Feet supported    Static Sitting - Level of Assistance  5: Stand by assistance      Dynamic Sitting Balance   Dynamic Sitting - Balance Support  Feet supported    Dynamic Sitting - Level of Assistance  5: Stand by assistance    Dynamic Sitting Balance - Compensations  trunk flexion    Dynamic Sitting - Balance Activities  Lateral lean/weight shifting;Forward lean/weight shifting      Static Standing Balance   Static Standing - Balance Support  Bilateral upper extremity supported    Static Standing - Level of Assistance  5: Stand by assistance      Dynamic Standing Balance   Dynamic Standing - Balance Support  Bilateral upper extremity supported    Dynamic Standing - Level of Assistance  5: Stand by assistance                   PT Education - 05/02/18 0809    Education provided  Yes    Education Details  POC, exercise technique     Person(s) Educated  Patient    Methods  Explanation;Demonstration;Verbal cues    Comprehension  Verbalized understanding;Returned demonstration       PT Short Term Goals - 05/02/18 1736      PT SHORT TERM GOAL #1   Title  Patient will be independent in home exercise program to improve strength/mobility for better functional independence with ADLs.    Baseline  HEP added    Time  2    Period  Weeks    Status  New    Target Date   05/16/18      PT SHORT TERM GOAL #2   Title   Patient (< 65 years old) will complete five times sit to stand test in < 20 seconds indicating an increased LE strength and improved balance    Baseline  5/8: 39 seconds with excessive UE support. 5/    Time  2    Period  Weeks    Status  New    Target Date  05/16/18      PT SHORT TERM GOAL #3   Title  Patient will ambulate 50 ft without rest breaks to increase functional independence.     Baseline  ambulates 30 ft     Time  2    Period  Weeks    Status  New    Target Date  05/16/18      PT SHORT TERM GOAL #4   Title  Patient will increase BLE gross strength to 4-/5 as to improve functional strength for  independent gait, increased standing tolerance and increased ADL ability.    Baseline  LLE 3/5 gross     Time  2    Period  Weeks    Status  New    Target Date  05/16/18        PT Long Term Goals - 05/02/18 1742      PT LONG TERM GOAL #1   Title  Patient will increase BLE gross strength to 4+/5 as to improve functional strength for independent gait, increased standing tolerance and increased ADL ability.    Baseline  3/6 and 5/8: LLE gross 3/5    Time  8    Period  Weeks    Status  New    Target Date  06/27/18      PT LONG TERM GOAL #2   Title  Patient will increase lower extremity functional scale to >60/80 to demonstrate improved functional mobility and increased tolerance with ADLs.     Baseline  3/6: 24/80 5/8: 24/80    Time  8    Period  Weeks    Status  New    Target Date  06/27/18      PT LONG TERM GOAL #3   Title  Patient (< 53 years old) will complete five times sit to stand test in < 10 seconds indicating an increased LE strength and improved balance    Baseline  3/6: 46 seconds with UE support 5/8: 39 seconds with excessive UE support     Time  8    Period  Weeks    Status  New    Target Date  06/27/18      PT LONG TERM GOAL #4   Title  Patient will increase 10 meter walk test to >1.60m/s as to improve gait  speed for better community ambulation and to reduce fall risk    Baseline  .75m/s     Time  8    Period  Weeks    Status  New    Target Date  06/27/18      PT LONG TERM GOAL #5   Title  Patient will increase ABC scale score >80% to demonstrate better functional mobility and better confidence with ADLs.     Baseline  5/8: 8.4%    Time  8    Period  Weeks    Status  New    Target Date  06/27/18             Plan - 05/02/18 1736    Clinical Impression Statement  Patient presents to evaluation s/p hospitalization for sepsis. Patient has been compliant with HEP prior to hospitalization and post return home. 10MWT=.31m/s and 5x STS =39 seconds with excessive UE support. ABC=8.4% with LEFS 24/80. Patient will continue to benefit from skilled physical therapy to increase strength, mobility, and gait mechanics for increased quality of life.     History and Personal Factors relevant to plan of care:  The patient presents with 3 personal factors/comorbidities, and 4 body elements including body structures and funcitons, activity limitations and or participation restrictions. Patient's condition is evolving.     Clinical Presentation  Evolving    Clinical Presentation due to:  progressively weakening with decreased mobility.     Clinical Decision Making  Moderate    Rehab Potential  Fair    Clinical Impairments Affecting Rehab Potential  + good motivation, +performing HEP from previous home health -chronicity, body habitus     PT Frequency  1x /  week    PT Duration  8 weeks    PT Treatment/Interventions  ADLs/Self Care Home Management;Cryotherapy;Ultrasound;Traction;Moist Heat;Electrical Stimulation;DME Instruction;Gait training;Stair training;Balance training;Therapeutic exercise;Therapeutic activities;Functional mobility training;Neuromuscular re-education;Patient/family education;Orthotic Fit/Training;Manual techniques;Wheelchair mobility training;Passive range of motion;Energy  conservation;Taping    PT Next Visit Plan  ambulatory, LE strengthening.     PT Home Exercise Plan  added gluteal strenght and hamstring curls to HEP    Consulted and Agree with Plan of Care  Patient       Patient will benefit from skilled therapeutic intervention in order to improve the following deficits and impairments:  Abnormal gait, Decreased activity tolerance, Decreased balance, Decreased endurance, Decreased knowledge of precautions, Decreased mobility, Decreased strength, Difficulty walking, Impaired perceived functional ability, Impaired sensation, Postural dysfunction, Improper body mechanics, Pain  Visit Diagnosis: Muscle weakness (generalized)  Other abnormalities of gait and mobility  Difficulty in walking, not elsewhere classified     Problem List Patient Active Problem List   Diagnosis Date Noted  . Sepsis (HCC) 04/02/2018  . 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 CKD (chronic kidney disease) stage 3, GFR 30-59 ml/min (HCC) 07/04/2017  . Class 3 drug-induced obesity with serious comorbidity and body mass index (BMI) of 45.0 to 49.9 in adult (HCC) 07/04/2017  . Chronic feet pain (Location of Primary Source of Pain) (Bilateral) (R>L) 04/25/2017  . Diabetic peripheral neuropathy (HCC) (Location of Primary Source of Pain) (Bilateral) (R>L) 04/25/2017  . Chronic lower extremity pain (Location of Secondary source of pain) (Bilateral) (R>L) 04/25/2017  . Chronic pain syndrome 04/25/2017  . Long term (current) use of opiate analgesic 04/25/2017  . Long term prescription opiate use 04/25/2017  . Opiate use 04/25/2017  . Chronic hand pain (Location of Tertiary source of pain) (Bilateral) (L>R) 04/25/2017  . Carpal tunnel syndrome (Bilateral) (L>R) 04/25/2017  . History of stroke 04/25/2017  . Infected decubitus ulcer, unstageable (HCC) 10/26/2016  . Closed fracture of humerus, surgical neck 05/31/2016  . Closed 3-part fracture of surgical neck of right humerus  with delayed healing 05/31/2016  . Pain in shoulder 05/19/2016  . Edema leg 05/18/2016  . 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 Kidney lump 05/18/2016  . Bilateral edema of lower extremity 05/18/2016  . Bilateral leg weakness 05/18/2016  . Kidney mass 05/18/2016  . Pressure ulcer 04/24/2016  . History of DKA (diabetic ketoacidosis) (HCC) 04/23/2016    Class: History of  . Diabetes mellitus (HCC) 04/23/2016  . Other specified diabetes mellitus with ketoacidosis without coma (HCC) 04/23/2016   Precious Bard, PT, DPT   05/02/2018, 5:49 PM   [facility] MAIN [facility] SERVICES [address] [place], [place], [address] Phone: [phone]   Fax:  [phone]  Name: [name] MRN: [id number] Date of Birth: 22-Dec-1961

## 2018-05-11 ENCOUNTER — Ambulatory Visit: Payer: BLUE CROSS/BLUE SHIELD

## 2018-05-11 DIAGNOSIS — R262 Difficulty in walking, not elsewhere classified: Secondary | ICD-10-CM

## 2018-05-11 DIAGNOSIS — R2689 Other abnormalities of gait and mobility: Secondary | ICD-10-CM

## 2018-05-11 DIAGNOSIS — M6281 Muscle weakness (generalized): Secondary | ICD-10-CM | POA: Diagnosis not present

## 2018-05-11 NOTE — Therapy (Signed)
[facility] MAIN [facility] SERVICES [address] [place], [place], [address] Phone: [phone]   Fax:  [phone]  Physical Therapy Treatment  Patient Details  Name: [name] MRN: [id number] Date of Birth: 03-18-1961 Referring Provider: Dr. Karenann Cai   Encounter Date: 05/11/2018  PT End of Session - 05/11/18 0859    Visit Number  2    Number of Visits  8    Date for PT Re-Evaluation  06/27/18    PT Start Time  0844    PT Stop Time  0930    PT Time Calculation (min)  46 min    Equipment Utilized During Treatment  Gait belt bilateral AFO's    Activity Tolerance  Patient tolerated treatment well;Patient limited by fatigue    Behavior During Therapy  [facility] for tasks assessed/performed       Past Medical History:  Diagnosis Date  . Acute pain of right shoulder 05/19/2016  . Acute PN (pyelonephritis) 05/18/2016  . Acute pyelonephritis 05/18/2016  . Anxiety   . Arthritis   . Asthma   . Diabetes mellitus without complication (HCC)   . GERD (gastroesophageal reflux disease)   . Glaucoma   . Hyperlipemia   . Hyperlipemia   . Hypertension     Past Surgical History:  Procedure Laterality Date  . ANTERIOR CRUCIATE LIGAMENT REPAIR      There were no vitals filed for this visit.  Subjective Assessment - 05/11/18 0848    Subjective  Patient reports burning and tingling in feet. Mom has a pool at home which has 4 steps to get in, patient wants to master steps to get into pool.     Pertinent History  Patient presented in manual wheelchair with bilateral AFOs. Was seen by this therapist prior to admittance to hospital on 03/28/18 for sepsis.  April of 2017 patient came home from work reported not feeling good, The next couple days was not feeling good. Went to doctor who called EMS to take her to the hospital. Was unable to get a number on her sugars initially due to patient not taking her medicine due to not feeling well and not eating.  Sugar was read to be in the 700's, also found multiple strains of a UTI/sepsis while there. Was in ICU for 12 days at [facility] then went to [facility]. While at [facility] was not walked or gotten up so after the fourth months there she returned home with in home therapy. Has not had any physical therapy since October. Gets around house with walker ok or in her wheelchair. Has pressure ulcer on Right buttocks with pmh including GERD, DM type II, HTN, and polyneuropathy. Patient wants to get stronger to be able to walk and stand again for more independence and to help her mother.      Limitations  Lifting;Standing;Walking;House hold activities;Other (comment)    How long can you sit comfortably?  hour    How long can you stand comfortably?  stand 5 minutes with walker     How long can you walk comfortably?  10 ft    Patient Stated Goals  walk better, want to drive, stand for longer, help her mom    Currently in Pain?  Yes    Pain Score  6     Pain Location  Leg    Pain Orientation  Right;Left    Pain Descriptors / Indicators  Tingling;Burning    Pain Type  Chronic pain;Neuropathic pain  Pain Onset  More than a month ago    Pain Frequency  Constant      Seated:               Marches BLE x12 with noted mild compensatory posterior trunk lean with raising LLE; compensation corrected by utilizing forward trunk lean to increase core activation.              Gluteal squeezes 14x  3 second holds              Hip abduction.OTB 10s    Standing                     Step up and downs 4" step, BUE support. . Bil knee hyperextension noted with step down 8x BLE, :               Hip extension/ step back 10x each LE with BUE    Ambulate in // bars 4x length : knee hyperextension with weight acceptance, verbal cues required for heel strike due to preference for foot flat contact. BUE support.    Pt requires long rest breaks between sets and between exercises due to quickness to fatigue and overheating   Pt.  response to medical necessity:  Patient will continue to benefit from skilled physical therapy to increase strength, mobility, and gait mechanics for increased quality of life.                          PT Education - 05/11/18 0858    Education provided  Yes    Education Details  exercise technique, step negotation     Person(s) Educated  Patient    Methods  Explanation;Demonstration;Verbal cues    Comprehension  Verbalized understanding;Returned demonstration       PT Short Term Goals - 05/02/18 1736      PT SHORT TERM GOAL #1   Title  Patient will be independent in home exercise program to improve strength/mobility for better functional independence with ADLs.    Baseline  HEP added    Time  2    Period  Weeks    Status  New    Target Date  05/16/18      PT SHORT TERM GOAL #2   Title   Patient (< 62 years old) will complete five times sit to stand test in < 20 seconds indicating an increased LE strength and improved balance    Baseline  5/8: 39 seconds with excessive UE support. 5/    Time  2    Period  Weeks    Status  New    Target Date  05/16/18      PT SHORT TERM GOAL #3   Title  Patient will ambulate 50 ft without rest breaks to increase functional independence.     Baseline  ambulates 30 ft     Time  2    Period  Weeks    Status  New    Target Date  05/16/18      PT SHORT TERM GOAL #4   Title  Patient will increase BLE gross strength to 4-/5 as to improve functional strength for independent gait, increased standing tolerance and increased ADL ability.    Baseline  LLE 3/5 gross     Time  2    Period  Weeks    Status  New    Target Date  05/16/18  PT Long Term Goals - 05/02/18 1742      PT LONG TERM GOAL #1   Title  Patient will increase BLE gross strength to 4+/5 as to improve functional strength for independent gait, increased standing tolerance and increased ADL ability.    Baseline  3/6 and 5/8: LLE gross 3/5    Time  8     Period  Weeks    Status  New    Target Date  06/27/18      PT LONG TERM GOAL #2   Title  Patient will increase lower extremity functional scale to >60/80 to demonstrate improved functional mobility and increased tolerance with ADLs.     Baseline  3/6: 24/80 5/8: 24/80    Time  8    Period  Weeks    Status  New    Target Date  06/27/18      PT LONG TERM GOAL #3   Title  Patient (< 88 years old) will complete five times sit to stand test in < 10 seconds indicating an increased LE strength and improved balance    Baseline  3/6: 46 seconds with UE support 5/8: 39 seconds with excessive UE support     Time  8    Period  Weeks    Status  New    Target Date  06/27/18      PT LONG TERM GOAL #4   Title  Patient will increase 10 meter walk test to >1.73m/s as to improve gait speed for better community ambulation and to reduce fall risk    Baseline  .97m/s     Time  8    Period  Weeks    Status  New    Target Date  06/27/18      PT LONG TERM GOAL #5   Title  Patient will increase ABC scale score >80% to demonstrate better functional mobility and better confidence with ADLs.     Baseline  5/8: 8.4%    Time  8    Period  Weeks    Status  New    Target Date  06/27/18            Plan - 05/11/18 0905    Clinical Impression Statement  Patient performed 4 in step negotiation with improved capacity for functional movement and decreased reliance upon UE support compared to previous attempts. Patient frequently c/o of overheating and required seated rest breaks. Patient will continue to benefit from skilled physical therapy to increase strength, mobility, and gait mechanics for increased quality of life.     Rehab Potential  Fair    Clinical Impairments Affecting Rehab Potential  + good motivation, +performing HEP from previous home health -chronicity, body habitus     PT Frequency  1x / week    PT Duration  8 weeks    PT Treatment/Interventions  ADLs/Self Care Home  Management;Cryotherapy;Ultrasound;Traction;Moist Heat;Electrical Stimulation;DME Instruction;Gait training;Stair training;Balance training;Therapeutic exercise;Therapeutic activities;Functional mobility training;Neuromuscular re-education;Patient/family education;Orthotic Fit/Training;Manual techniques;Wheelchair mobility training;Passive range of motion;Energy conservation;Taping    PT Next Visit Plan  ambulatory, LE strengthening.     PT Home Exercise Plan  added gluteal strenght and hamstring curls to HEP    Consulted and Agree with Plan of Care  Patient       Patient will benefit from skilled therapeutic intervention in order to improve the following deficits and impairments:  Abnormal gait, Decreased activity tolerance, Decreased balance, Decreased endurance, Decreased knowledge of precautions, Decreased mobility, Decreased strength, Difficulty walking,  Impaired perceived functional ability, Impaired sensation, Postural dysfunction, Improper body mechanics, Pain  Visit Diagnosis: Muscle weakness (generalized)  Other abnormalities of gait and mobility  Difficulty in walking, not elsewhere classified     Problem List Patient Active Problem List   Diagnosis Date Noted  . Sepsis (HCC) 04/02/2018  . 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 CKD (chronic kidney disease) stage 3, GFR 30-59 ml/min (HCC) 07/04/2017  . Class 3 drug-induced obesity with serious comorbidity and body mass index (BMI) of 45.0 to 49.9 in adult (HCC) 07/04/2017  . Chronic feet pain (Location of Primary Source of Pain) (Bilateral) (R>L) 04/25/2017  . Diabetic peripheral neuropathy (HCC) (Location of Primary Source of Pain) (Bilateral) (R>L) 04/25/2017  . Chronic lower extremity pain (Location of Secondary source of pain) (Bilateral) (R>L) 04/25/2017  . Chronic pain syndrome 04/25/2017  . Long term (current) use of opiate analgesic 04/25/2017  . Long term prescription opiate use 04/25/2017  . Opiate use  04/25/2017  . Chronic hand pain (Location of Tertiary source of pain) (Bilateral) (L>R) 04/25/2017  . Carpal tunnel syndrome (Bilateral) (L>R) 04/25/2017  . History of stroke 04/25/2017  . Infected decubitus ulcer, unstageable (HCC) 10/26/2016  . Closed fracture of humerus, surgical neck 05/31/2016  . Closed 3-part fracture of surgical neck of right humerus with delayed healing 05/31/2016  . Pain in shoulder 05/19/2016  . Edema leg 05/18/2016  . 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 Kidney lump 05/18/2016  . Bilateral edema of lower extremity 05/18/2016  . Bilateral leg weakness 05/18/2016  . Kidney mass 05/18/2016  . Pressure ulcer 04/24/2016  . History of DKA (diabetic ketoacidosis) (HCC) 04/23/2016    Class: History of  . Diabetes mellitus (HCC) 04/23/2016  . Other specified diabetes mellitus with ketoacidosis without coma (HCC) 04/23/2016    Precious Bard, PT, DPT    05/11/2018, 9:30 AM  [facility] MAIN [facility] SERVICES [address] [place], [place], [address] Phone: [phone]   Fax:  [phone]  Name: Lakeba Pross Nemes MRN: [id number] Date of Birth: 1961/08/26

## 2018-05-15 ENCOUNTER — Ambulatory Visit: Payer: BLUE CROSS/BLUE SHIELD

## 2018-05-15 DIAGNOSIS — R262 Difficulty in walking, not elsewhere classified: Secondary | ICD-10-CM

## 2018-05-15 DIAGNOSIS — R2689 Other abnormalities of gait and mobility: Secondary | ICD-10-CM

## 2018-05-15 DIAGNOSIS — M6281 Muscle weakness (generalized): Secondary | ICD-10-CM | POA: Diagnosis not present

## 2018-05-15 NOTE — Therapy (Signed)
[facility] MAIN [facility] SERVICES [address] [place], [place], [address] Phone: [phone]   Fax:  [phone]  Physical Therapy Treatment  Patient Details  Name: [name] MRN: [id number] Date of Birth: [birth year] Referring Provider: Dr. Karenann Cai   Encounter Date: 05/15/2018  PT End of Session - 05/15/18 0820    Visit Number  3    Number of Visits  8    Date for PT Re-Evaluation  06/27/18    PT Start Time  0803    PT Stop Time  0845    PT Time Calculation (min)  42 min    Equipment Utilized During Treatment  Gait belt bilateral AFO's    Activity Tolerance  Patient tolerated treatment well;Patient limited by fatigue    Behavior During Therapy  [facility] for tasks assessed/performed       Past Medical History:  Diagnosis Date  . Acute pain of right shoulder 05/19/2016  . Acute PN (pyelonephritis) 05/18/2016  . Acute pyelonephritis 05/18/2016  . Anxiety   . Arthritis   . Asthma   . Diabetes mellitus without complication (HCC)   . GERD (gastroesophageal reflux disease)   . Glaucoma   . Hyperlipemia   . Hyperlipemia   . Hypertension     Past Surgical History:  Procedure Laterality Date  . ANTERIOR CRUCIATE LIGAMENT REPAIR      There were no vitals filed for this visit.  Subjective Assessment - 05/15/18 0815    Subjective  Patient reports tingling in bilateral feet. Reports no stumbles or falls. Compliance with HEP.     Pertinent History  Patient presented in manual wheelchair with bilateral AFOs. Was seen by this therapist prior to admittance to hospital on 03/28/18 for sepsis.  April of 2017 patient came home from work reported not feeling good, The next couple days was not feeling good. Went to doctor who called EMS to take her to the hospital. Was unable to get a number on her sugars initially due to patient not taking her medicine due to not feeling well and not eating. Sugar was read to be in the 700's, also found multiple  strains of a UTI/sepsis while there. Was in ICU for 12 days at [facility] then went to [facility]. While at [facility] was not walked or gotten up so after the fourth months there she returned home with in home therapy. Has not had any physical therapy since October. Gets around house with walker ok or in her wheelchair. Has pressure ulcer on Right buttocks with pmh including GERD, DM type II, HTN, and polyneuropathy. Patient wants to get stronger to be able to walk and stand again for more independence and to help her mother.      Limitations  Lifting;Standing;Walking;House hold activities;Other (comment)    How long can you sit comfortably?  hour    How long can you stand comfortably?  stand 5 minutes with walker     How long can you walk comfortably?  10 ft    Patient Stated Goals  walk better, want to drive, stand for longer, help her mom    Currently in Pain?  Yes    Pain Score  6     Pain Location  Foot    Pain Orientation  Right;Left    Pain Descriptors / Indicators  Burning;Pins and needles    Pain Type  Chronic pain;Neuropathic pain    Pain Onset  More than a month ago  Pain Frequency  Constant       Seated:               Marches BLE x12 with noted mild compensatory posterior trunk lean with raising LLE; compensation corrected by utilizing forward trunk lean to increase core activation.              Gluteal squeezes 10x  3 second holds              Hip abduction OTB 20x   Standing    6" step toe taps 10x BUE support. Cues for excessive hip flexion for clearance of step.    Step up and down two consecutive steps in // bars BUE support (6" step, 4 " step) cues for up with good down with weak.                Hip extension/ step back 10x each LE with BUE    Standing hip abduction 10x each leg. BUE support    Lunge onto bosu ball 6x each leg; BUe support   Pt requires long rest breaks between sets and between exercises due to quickness to fatigue and overheating   Pt. response to  medical necessity:  Patient will continue to benefit from skilled physical therapy to increase strength, mobility, and gait mechanics for increased quality of life.                         PT Education - 05/15/18 0818    Education provided  Yes    Education Details  exercise technique     Person(s) Educated  Patient    Methods  Explanation;Demonstration    Comprehension  Verbalized understanding;Returned demonstration;Need further instruction       PT Short Term Goals - 05/02/18 1736      PT SHORT TERM GOAL #1   Title  Patient will be independent in home exercise program to improve strength/mobility for better functional independence with ADLs.    Baseline  HEP added    Time  2    Period  Weeks    Status  New    Target Date  05/16/18      PT SHORT TERM GOAL #2   Title   Patient (< 74 years old) will complete five times sit to stand test in < 20 seconds indicating an increased LE strength and improved balance    Baseline  5/8: 39 seconds with excessive UE support. 5/    Time  2    Period  Weeks    Status  New    Target Date  05/16/18      PT SHORT TERM GOAL #3   Title  Patient will ambulate 50 ft without rest breaks to increase functional independence.     Baseline  ambulates 30 ft     Time  2    Period  Weeks    Status  New    Target Date  05/16/18      PT SHORT TERM GOAL #4   Title  Patient will increase BLE gross strength to 4-/5 as to improve functional strength for independent gait, increased standing tolerance and increased ADL ability.    Baseline  LLE 3/5 gross     Time  2    Period  Weeks    Status  New    Target Date  05/16/18        PT Long Term Goals - 05/02/18 1742  PT LONG TERM GOAL #1   Title  Patient will increase BLE gross strength to 4+/5 as to improve functional strength for independent gait, increased standing tolerance and increased ADL ability.    Baseline  3/6 and 5/8: LLE gross 3/5    Time  8    Period  Weeks     Status  New    Target Date  06/27/18      PT LONG TERM GOAL #2   Title  Patient will increase lower extremity functional scale to >60/80 to demonstrate improved functional mobility and increased tolerance with ADLs.     Baseline  3/6: 24/80 5/8: 24/80    Time  8    Period  Weeks    Status  New    Target Date  06/27/18      PT LONG TERM GOAL #3   Title  Patient (< 34 years old) will complete five times sit to stand test in < 10 seconds indicating an increased LE strength and improved balance    Baseline  3/6: 46 seconds with UE support 5/8: 39 seconds with excessive UE support     Time  8    Period  Weeks    Status  New    Target Date  06/27/18      PT LONG TERM GOAL #4   Title  Patient will increase 10 meter walk test to >1.47m/s as to improve gait speed for better community ambulation and to reduce fall risk    Baseline  .10m/s     Time  8    Period  Weeks    Status  New    Target Date  06/27/18      PT LONG TERM GOAL #5   Title  Patient will increase ABC scale score >80% to demonstrate better functional mobility and better confidence with ADLs.     Baseline  5/8: 8.4%    Time  8    Period  Weeks    Status  New    Target Date  06/27/18            Plan - 05/15/18 4580    Clinical Impression Statement  Patient demonstrated ability to negotiate two consecutive steps with excessive BUE support and verbal cueing for ascending leading with strong foot, and descending leading with weaker leg. Patient fatigued quickly and required seated rest breaks.  Patient will continue to benefit from skilled physical therapy to increase strength, mobility, and gait mechanics for increased quality of life.    Rehab Potential  Fair    Clinical Impairments Affecting Rehab Potential  + good motivation, +performing HEP from previous home health -chronicity, body habitus     PT Frequency  1x / week    PT Duration  8 weeks    PT Treatment/Interventions  ADLs/Self Care Home  Management;Cryotherapy;Ultrasound;Traction;Moist Heat;Electrical Stimulation;DME Instruction;Gait training;Stair training;Balance training;Therapeutic exercise;Therapeutic activities;Functional mobility training;Neuromuscular re-education;Patient/family education;Orthotic Fit/Training;Manual techniques;Wheelchair mobility training;Passive range of motion;Energy conservation;Taping    PT Next Visit Plan  ambulatory, LE strengthening.     PT Home Exercise Plan  added gluteal strenght and hamstring curls to HEP    Consulted and Agree with Plan of Care  Patient       Patient will benefit from skilled therapeutic intervention in order to improve the following deficits and impairments:  Abnormal gait, Decreased activity tolerance, Decreased balance, Decreased endurance, Decreased knowledge of precautions, Decreased mobility, Decreased strength, Difficulty walking, Impaired perceived functional ability, Impaired sensation, Postural dysfunction, Improper  body mechanics, Pain  Visit Diagnosis: Muscle weakness (generalized)  Other abnormalities of gait and mobility  Difficulty in walking, not elsewhere classified     Problem List Patient Active Problem List   Diagnosis Date Noted  . Sepsis (HCC) 04/02/2018  . 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 CKD (chronic kidney disease) stage 3, GFR 30-59 ml/min (HCC) 07/04/2017  . Class 3 drug-induced obesity with serious comorbidity and body mass index (BMI) of 45.0 to 49.9 in adult (HCC) 07/04/2017  . Chronic feet pain (Location of Primary Source of Pain) (Bilateral) (R>L) 04/25/2017  . Diabetic peripheral neuropathy (HCC) (Location of Primary Source of Pain) (Bilateral) (R>L) 04/25/2017  . Chronic lower extremity pain (Location of Secondary source of pain) (Bilateral) (R>L) 04/25/2017  . Chronic pain syndrome 04/25/2017  . Long term (current) use of opiate analgesic 04/25/2017  . Long term prescription opiate use 04/25/2017  . Opiate use  04/25/2017  . Chronic hand pain (Location of Tertiary source of pain) (Bilateral) (L>R) 04/25/2017  . Carpal tunnel syndrome (Bilateral) (L>R) 04/25/2017  . History of stroke 04/25/2017  . Infected decubitus ulcer, unstageable (HCC) 10/26/2016  . Closed fracture of humerus, surgical neck 05/31/2016  . Closed 3-part fracture of surgical neck of right humerus with delayed healing 05/31/2016  . Pain in shoulder 05/19/2016  . Edema leg 05/18/2016  . 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 Kidney lump 05/18/2016  . Bilateral edema of lower extremity 05/18/2016  . Bilateral leg weakness 05/18/2016  . Kidney mass 05/18/2016  . Pressure ulcer 04/24/2016  . History of DKA (diabetic ketoacidosis) (HCC) 04/23/2016    Class: History of  . Diabetes mellitus (HCC) 04/23/2016  . Other specified diabetes mellitus with ketoacidosis without coma (HCC) 04/23/2016   Precious Bard, PT, DPT   05/15/2018, 8:48 AM  [facility] MAIN [facility] SERVICES [address] [place], [place], [address] Phone: [phone]   Fax:  [phone]  Name: [name] MRN: [id number] Date of Birth: 28-Aug-1961

## 2018-05-22 ENCOUNTER — Ambulatory Visit: Payer: BLUE CROSS/BLUE SHIELD

## 2018-05-22 DIAGNOSIS — R262 Difficulty in walking, not elsewhere classified: Secondary | ICD-10-CM

## 2018-05-22 DIAGNOSIS — M6281 Muscle weakness (generalized): Secondary | ICD-10-CM

## 2018-05-22 DIAGNOSIS — R2689 Other abnormalities of gait and mobility: Secondary | ICD-10-CM

## 2018-05-22 NOTE — Therapy (Signed)
[facility] MAIN [facility] SERVICES [address] [place], [place], [address] Phone: [phone]   Fax:  [phone]  Physical Therapy Treatment  Patient Details  Name: [name] MRN: [id number] Date of Birth: May 26, 1961 Referring Provider: Dr. Karenann Cai   Encounter Date: 05/22/2018  PT End of Session - 05/22/18 0857    Visit Number  4    Number of Visits  8    Date for PT Re-Evaluation  06/27/18    PT Start Time  0845    PT Stop Time  0929    PT Time Calculation (min)  44 min    Equipment Utilized During Treatment  Gait belt bilateral AFO's    Activity Tolerance  Patient tolerated treatment well;Patient limited by fatigue    Behavior During Therapy  [facility] for tasks assessed/performed       Past Medical History:  Diagnosis Date  . Acute pain of right shoulder 05/19/2016  . Acute PN (pyelonephritis) 05/18/2016  . Acute pyelonephritis 05/18/2016  . Anxiety   . Arthritis   . Asthma   . Diabetes mellitus without complication (HCC)   . GERD (gastroesophageal reflux disease)   . Glaucoma   . Hyperlipemia   . Hyperlipemia   . Hypertension     Past Surgical History:  Procedure Laterality Date  . ANTERIOR CRUCIATE LIGAMENT REPAIR      There were no vitals filed for this visit.  Subjective Assessment - 05/22/18 0847    Subjective  Patient reports compliance with HEP , no questions or concerns. STill has burning of feet which is "normal for her" per patient report. No stumbles or falls. Waiting on mothers pool to be cleaned.     Pertinent History  Patient presented in manual wheelchair with bilateral AFOs. Was seen by this therapist prior to admittance to hospital on 03/28/18 for sepsis.  April of 2017 patient came home from work reported not feeling good, The next couple days was not feeling good. Went to doctor who called EMS to take her to the hospital. Was unable to get a number on her sugars initially due to patient not taking her  medicine due to not feeling well and not eating. Sugar was read to be in the 700's, also found multiple strains of a UTI/sepsis while there. Was in ICU for 12 days at [facility] then went to [facility]. While at [facility] was not walked or gotten up so after the fourth months there she returned home with in home therapy. Has not had any physical therapy since October. Gets around house with walker ok or in her wheelchair. Has pressure ulcer on Right buttocks with pmh including GERD, DM type II, HTN, and polyneuropathy. Patient wants to get stronger to be able to walk and stand again for more independence and to help her mother.      Limitations  Lifting;Standing;Walking;House hold activities;Other (comment)    How long can you sit comfortably?  hour    How long can you stand comfortably?  stand 5 minutes with walker     How long can you walk comfortably?  10 ft    Patient Stated Goals  walk better, want to drive, stand for longer, help her mom    Currently in Pain?  Yes    Pain Score  6     Pain Location  Foot    Pain Orientation  Right;Left    Pain Descriptors / Indicators  Burning    Pain  Type  Chronic pain;Neuropathic pain    Pain Onset  More than a month ago    Pain Frequency  Constant        Seated:   LAQ 10x3 seconds holds BLE             Marches BLE x20 with noted mild compensatory posterior trunk lean with raising LLE; compensation corrected by utilizing forward trunk lean to increase core activation.              Gluteal squeezes 20x  3 second holds              Hip abduction OTB 25x   Standing               6" step toe taps 10x BUE support. Cues for excessive hip flexion for clearance of step. Cues for decreased UE support.                Step up and down two consecutive steps in // bars BUE support (6" step, 4 " step) cues for up with good down with weak.     Side toe taps 6" step BUE support 10x each leg              Hip extension/ step back 10x each LE with BUE      Pt  requires long rest breaks between sets and between exercises due to quickness to fatigue and overheating   Pt. response to medical necessity:  Patient will continue to benefit from skilled physical therapy to increase strength, mobility, and gait mechanics for increased quality of life.                        PT Education - 05/22/18 0849    Education provided  Yes    Education Details  exercise technique    Person(s) Educated  Patient    Methods  Explanation;Demonstration;Verbal cues    Comprehension  Verbalized understanding;Returned demonstration       PT Short Term Goals - 05/02/18 1736      PT SHORT TERM GOAL #1   Title  Patient will be independent in home exercise program to improve strength/mobility for better functional independence with ADLs.    Baseline  HEP added    Time  2    Period  Weeks    Status  New    Target Date  05/16/18      PT SHORT TERM GOAL #2   Title   Patient (< 60 years old) will complete five times sit to stand test in < 20 seconds indicating an increased LE strength and improved balance    Baseline  5/8: 39 seconds with excessive UE support. 5/    Time  2    Period  Weeks    Status  New    Target Date  05/16/18      PT SHORT TERM GOAL #3   Title  Patient will ambulate 50 ft without rest breaks to increase functional independence.     Baseline  ambulates 30 ft     Time  2    Period  Weeks    Status  New    Target Date  05/16/18      PT SHORT TERM GOAL #4   Title  Patient will increase BLE gross strength to 4-/5 as to improve functional strength for independent gait, increased standing tolerance and increased ADL ability.    Baseline  LLE  3/5 gross     Time  2    Period  Weeks    Status  New    Target Date  05/16/18        PT Long Term Goals - 05/02/18 1742      PT LONG TERM GOAL #1   Title  Patient will increase BLE gross strength to 4+/5 as to improve functional strength for independent gait, increased standing  tolerance and increased ADL ability.    Baseline  3/6 and 5/8: LLE gross 3/5    Time  8    Period  Weeks    Status  New    Target Date  06/27/18      PT LONG TERM GOAL #2   Title  Patient will increase lower extremity functional scale to >60/80 to demonstrate improved functional mobility and increased tolerance with ADLs.     Baseline  3/6: 24/80 5/8: 24/80    Time  8    Period  Weeks    Status  New    Target Date  06/27/18      PT LONG TERM GOAL #3   Title  Patient (< 66 years old) will complete five times sit to stand test in < 10 seconds indicating an increased LE strength and improved balance    Baseline  3/6: 46 seconds with UE support 5/8: 39 seconds with excessive UE support     Time  8    Period  Weeks    Status  New    Target Date  06/27/18      PT LONG TERM GOAL #4   Title  Patient will increase 10 meter walk test to >1.55m/s as to improve gait speed for better community ambulation and to reduce fall risk    Baseline  .53m/s     Time  8    Period  Weeks    Status  New    Target Date  06/27/18      PT LONG TERM GOAL #5   Title  Patient will increase ABC scale score >80% to demonstrate better functional mobility and better confidence with ADLs.     Baseline  5/8: 8.4%    Time  8    Period  Weeks    Status  New    Target Date  06/27/18            Plan - 05/22/18 0915    Clinical Impression Statement  Patient demonstrates improved fluidity of movement when negotiating steps with continued excessive reliance upon UE's for support.  Limited ability to retain standing position due to fatigue and heating results in frequent seated rest breaks. Patient will continue to benefit from skilled physical therapy to increase strength, mobility, and gait mechanics for increased quality of life.    Rehab Potential  Fair    Clinical Impairments Affecting Rehab Potential  + good motivation, +performing HEP from previous home health -chronicity, body habitus     PT Frequency  1x  / week    PT Duration  8 weeks    PT Treatment/Interventions  ADLs/Self Care Home Management;Cryotherapy;Ultrasound;Traction;Moist Heat;Electrical Stimulation;DME Instruction;Gait training;Stair training;Balance training;Therapeutic exercise;Therapeutic activities;Functional mobility training;Neuromuscular re-education;Patient/family education;Orthotic Fit/Training;Manual techniques;Wheelchair mobility training;Passive range of motion;Energy conservation;Taping    PT Next Visit Plan  ambulatory, LE strengthening.     PT Home Exercise Plan  added gluteal strenght and hamstring curls to HEP    Consulted and Agree with Plan of Care  Patient  Patient will benefit from skilled therapeutic intervention in order to improve the following deficits and impairments:  Abnormal gait, Decreased activity tolerance, Decreased balance, Decreased endurance, Decreased knowledge of precautions, Decreased mobility, Decreased strength, Difficulty walking, Impaired perceived functional ability, Impaired sensation, Postural dysfunction, Improper body mechanics, Pain  Visit Diagnosis: Other abnormalities of gait and mobility  Difficulty in walking, not elsewhere classified  Muscle weakness (generalized)     Problem List Patient Active Problem List   Diagnosis Date Noted  . Sepsis (HCC) 04/02/2018  . 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 CKD (chronic kidney disease) stage 3, GFR 30-59 ml/min (HCC) 07/04/2017  . Class 3 drug-induced obesity with serious comorbidity and body mass index (BMI) of 45.0 to 49.9 in adult (HCC) 07/04/2017  . Chronic feet pain (Location of Primary Source of Pain) (Bilateral) (R>L) 04/25/2017  . Diabetic peripheral neuropathy (HCC) (Location of Primary Source of Pain) (Bilateral) (R>L) 04/25/2017  . Chronic lower extremity pain (Location of Secondary source of pain) (Bilateral) (R>L) 04/25/2017  . Chronic pain syndrome 04/25/2017  . Long term (current) use of opiate  analgesic 04/25/2017  . Long term prescription opiate use 04/25/2017  . Opiate use 04/25/2017  . Chronic hand pain (Location of Tertiary source of pain) (Bilateral) (L>R) 04/25/2017  . Carpal tunnel syndrome (Bilateral) (L>R) 04/25/2017  . History of stroke 04/25/2017  . Infected decubitus ulcer, unstageable (HCC) 10/26/2016  . Closed fracture of humerus, surgical neck 05/31/2016  . Closed 3-part fracture of surgical neck of right humerus with delayed healing 05/31/2016  . Pain in shoulder 05/19/2016  . Edema leg 05/18/2016  . 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 Kidney lump 05/18/2016  . Bilateral edema of lower extremity 05/18/2016  . Bilateral leg weakness 05/18/2016  . Kidney mass 05/18/2016  . Pressure ulcer 04/24/2016  . History of DKA (diabetic ketoacidosis) (HCC) 04/23/2016    Class: History of  . Diabetes mellitus (HCC) 04/23/2016  . Other specified diabetes mellitus with ketoacidosis without coma (HCC) 04/23/2016   Precious Bard, PT, DPT   05/22/2018, 9:30 AM  [facility] MAIN [facility] SERVICES [address] [place], [place], [address] Phone: [phone]   Fax:  [phone]  Name: [name] MRN: [id number] Date of Birth: February 28, 1961

## 2018-05-30 ENCOUNTER — Ambulatory Visit: Payer: BLUE CROSS/BLUE SHIELD | Attending: Family Medicine

## 2018-05-30 DIAGNOSIS — M6281 Muscle weakness (generalized): Secondary | ICD-10-CM | POA: Insufficient documentation

## 2018-05-30 DIAGNOSIS — R262 Difficulty in walking, not elsewhere classified: Secondary | ICD-10-CM | POA: Diagnosis present

## 2018-05-30 DIAGNOSIS — R2689 Other abnormalities of gait and mobility: Secondary | ICD-10-CM | POA: Insufficient documentation

## 2018-05-30 NOTE — Therapy (Signed)
[facility] MAIN [facility] SERVICES [address] [place], [place], [address] Phone: [phone]   Fax:  [phone]  Physical Therapy Treatment  Patient Details  Name: [name] MRN: [id number] Date of Birth: 25-Jun-1961 Referring Provider: Dr. Karenann Cai   Encounter Date: 05/30/2018  PT End of Session - 05/30/18 0854    Visit Number  5    Number of Visits  8    Date for PT Re-Evaluation  06/27/18    PT Start Time  0846    PT Stop Time  0930    PT Time Calculation (min)  44 min    Equipment Utilized During Treatment  Gait belt bilateral AFO's    Activity Tolerance  Patient tolerated treatment well;Patient limited by fatigue    Behavior During Therapy  [facility] for tasks assessed/performed       Past Medical History:  Diagnosis Date  . Acute pain of right shoulder 05/19/2016  . Acute PN (pyelonephritis) 05/18/2016  . Acute pyelonephritis 05/18/2016  . Anxiety   . Arthritis   . Asthma   . Diabetes mellitus without complication (HCC)   . GERD (gastroesophageal reflux disease)   . Glaucoma   . Hyperlipemia   . Hyperlipemia   . Hypertension     Past Surgical History:  Procedure Laterality Date  . ANTERIOR CRUCIATE LIGAMENT REPAIR      There were no vitals filed for this visit.  Subjective Assessment - 05/30/18 0848    Subjective  Patient reports today is having a really bad day for tingling in feet. This happens about once every two weeks, waking her up. Reports compliance with HEP.     Pertinent History  Patient presented in manual wheelchair with bilateral AFOs. Was seen by this therapist prior to admittance to hospital on 03/28/18 for sepsis.  April of 2017 patient came home from work reported not feeling good, The next couple days was not feeling good. Went to doctor who called EMS to take her to the hospital. Was unable to get a number on her sugars initially due to patient not taking her medicine due to not feeling well and not  eating. Sugar was read to be in the 700's, also found multiple strains of a UTI/sepsis while there. Was in ICU for 12 days at [facility] then went to [facility]. While at [facility] was not walked or gotten up so after the fourth months there she returned home with in home therapy. Has not had any physical therapy since October. Gets around house with walker ok or in her wheelchair. Has pressure ulcer on Right buttocks with pmh including GERD, DM type II, HTN, and polyneuropathy. Patient wants to get stronger to be able to walk and stand again for more independence and to help her mother.      Limitations  Lifting;Standing;Walking;House hold activities;Other (comment)    How long can you sit comfortably?  hour    How long can you stand comfortably?  stand 5 minutes with walker     How long can you walk comfortably?  10 ft    Patient Stated Goals  walk better, want to drive, stand for longer, help her mom    Currently in Pain?  Yes    Pain Score  9     Pain Location  Foot    Pain Orientation  Right;Left    Pain Descriptors / Indicators  Tingling;Burning    Pain Type  Chronic pain;Neuropathic pain  Pain Onset  More than a month ago    Pain Frequency  Constant       Seated:                       Marches BLE x15 with noted mild compensatory posterior trunk lean with raising LLE; compensation corrected by utilizing forward trunk lean to increase core activation.              Gluteal squeezes 15x  3 second holds              Hip abduction OTB 20x   Hip abduction squeezes ball 20x.    Standing  Side step up and down // bars 4x length of bars. BUE support   Forward lunge onto Bosu ball 12x each leg BUE support        Airex pad: static balance 3x30 seconds              Step up and down 6" step BUE support 6x each leg leading.           Pt requires long rest breaks between sets and between exercises due to quickness to fatigue and overheating   Pt. response to medical necessity:  Patient will  continue to benefit from skilled physical therapy to increase strength, mobility, and gait mechanics for increased quality of life.                              PT Education - 05/30/18 0854    Education provided  Yes    Education Details  exercise technique     Person(s) Educated  Patient    Methods  Explanation;Demonstration;Verbal cues    Comprehension  Verbalized understanding;Returned demonstration       PT Short Term Goals - 05/02/18 1736      PT SHORT TERM GOAL #1   Title  Patient will be independent in home exercise program to improve strength/mobility for better functional independence with ADLs.    Baseline  HEP added    Time  2    Period  Weeks    Status  New    Target Date  05/16/18      PT SHORT TERM GOAL #2   Title   Patient (< 6 years old) will complete five times sit to stand test in < 20 seconds indicating an increased LE strength and improved balance    Baseline  5/8: 39 seconds with excessive UE support. 5/    Time  2    Period  Weeks    Status  New    Target Date  05/16/18      PT SHORT TERM GOAL #3   Title  Patient will ambulate 50 ft without rest breaks to increase functional independence.     Baseline  ambulates 30 ft     Time  2    Period  Weeks    Status  New    Target Date  05/16/18      PT SHORT TERM GOAL #4   Title  Patient will increase BLE gross strength to 4-/5 as to improve functional strength for independent gait, increased standing tolerance and increased ADL ability.    Baseline  LLE 3/5 gross     Time  2    Period  Weeks    Status  New    Target Date  05/16/18  PT Long Term Goals - 05/02/18 1742      PT LONG TERM GOAL #1   Title  Patient will increase BLE gross strength to 4+/5 as to improve functional strength for independent gait, increased standing tolerance and increased ADL ability.    Baseline  3/6 and 5/8: LLE gross 3/5    Time  8    Period  Weeks    Status  New    Target Date  06/27/18       PT LONG TERM GOAL #2   Title  Patient will increase lower extremity functional scale to >60/80 to demonstrate improved functional mobility and increased tolerance with ADLs.     Baseline  3/6: 24/80 5/8: 24/80    Time  8    Period  Weeks    Status  New    Target Date  06/27/18      PT LONG TERM GOAL #3   Title  Patient (< 24 years old) will complete five times sit to stand test in < 10 seconds indicating an increased LE strength and improved balance    Baseline  3/6: 46 seconds with UE support 5/8: 39 seconds with excessive UE support     Time  8    Period  Weeks    Status  New    Target Date  06/27/18      PT LONG TERM GOAL #4   Title  Patient will increase 10 meter walk test to >1.74m/s as to improve gait speed for better community ambulation and to reduce fall risk    Baseline  .105m/s     Time  8    Period  Weeks    Status  New    Target Date  06/27/18      PT LONG TERM GOAL #5   Title  Patient will increase ABC scale score >80% to demonstrate better functional mobility and better confidence with ADLs.     Baseline  5/8: 8.4%    Time  8    Period  Weeks    Status  New    Target Date  06/27/18            Plan - 05/30/18 0903    Clinical Impression Statement  Patient progressing with functional standing capacity demonstrating ability to side step and lunge onto unstable surface with BUE support. Patient challenged with dynamic mobility by low back pain and tingling of feet.  Body habitus limits full mobility and causes heating requiring seated rest breaks.  Patient will continue to benefit from skilled physical therapy to increase strength, mobility, and gait mechanics for increased quality of life.    Rehab Potential  Fair    Clinical Impairments Affecting Rehab Potential  + good motivation, +performing HEP from previous home health -chronicity, body habitus     PT Frequency  1x / week    PT Duration  8 weeks    PT Treatment/Interventions  ADLs/Self Care Home  Management;Cryotherapy;Ultrasound;Traction;Moist Heat;Electrical Stimulation;DME Instruction;Gait training;Stair training;Balance training;Therapeutic exercise;Therapeutic activities;Functional mobility training;Neuromuscular re-education;Patient/family education;Orthotic Fit/Training;Manual techniques;Wheelchair mobility training;Passive range of motion;Energy conservation;Taping    PT Next Visit Plan  ambulatory, LE strengthening.     PT Home Exercise Plan  added gluteal strenght and hamstring curls to HEP    Consulted and Agree with Plan of Care  Patient       Patient will benefit from skilled therapeutic intervention in order to improve the following deficits and impairments:  Abnormal gait, Decreased activity tolerance, Decreased  balance, Decreased endurance, Decreased knowledge of precautions, Decreased mobility, Decreased strength, Difficulty walking, Impaired perceived functional ability, Impaired sensation, Postural dysfunction, Improper body mechanics, Pain  Visit Diagnosis: Other abnormalities of gait and mobility  Difficulty in walking, not elsewhere classified  Muscle weakness (generalized)     Problem List Patient Active Problem List   Diagnosis Date Noted  . Sepsis (HCC) 04/02/2018  . 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 CKD (chronic kidney disease) stage 3, GFR 30-59 ml/min (HCC) 07/04/2017  . Class 3 drug-induced obesity with serious comorbidity and body mass index (BMI) of 45.0 to 49.9 in adult (HCC) 07/04/2017  . Chronic feet pain (Location of Primary Source of Pain) (Bilateral) (R>L) 04/25/2017  . Diabetic peripheral neuropathy (HCC) (Location of Primary Source of Pain) (Bilateral) (R>L) 04/25/2017  . Chronic lower extremity pain (Location of Secondary source of pain) (Bilateral) (R>L) 04/25/2017  . Chronic pain syndrome 04/25/2017  . Long term (current) use of opiate analgesic 04/25/2017  . Long term prescription opiate use 04/25/2017  . Opiate use  04/25/2017  . Chronic hand pain (Location of Tertiary source of pain) (Bilateral) (L>R) 04/25/2017  . Carpal tunnel syndrome (Bilateral) (L>R) 04/25/2017  . History of stroke 04/25/2017  . Infected decubitus ulcer, unstageable (HCC) 10/26/2016  . Closed fracture of humerus, surgical neck 05/31/2016  . Closed 3-part fracture of surgical neck of right humerus with delayed healing 05/31/2016  . Pain in shoulder 05/19/2016  . Edema leg 05/18/2016  . 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 Kidney lump 05/18/2016  . Bilateral edema of lower extremity 05/18/2016  . Bilateral leg weakness 05/18/2016  . Kidney mass 05/18/2016  . Pressure ulcer 04/24/2016  . History of DKA (diabetic ketoacidosis) (HCC) 04/23/2016    Class: History of  . Diabetes mellitus (HCC) 04/23/2016  . Other specified diabetes mellitus with ketoacidosis without coma (HCC) 04/23/2016   Precious Bard, PT, DPT   05/30/2018, 9:30 AM  [facility] MAIN [facility] SERVICES [address] [place], [place], [address] Phone: [phone]   Fax:  [phone]  Name: [name] MRN: [id number] Date of Birth: 30-Jul-1961

## 2018-06-07 ENCOUNTER — Ambulatory Visit: Payer: BLUE CROSS/BLUE SHIELD

## 2018-06-07 DIAGNOSIS — R2689 Other abnormalities of gait and mobility: Secondary | ICD-10-CM | POA: Diagnosis not present

## 2018-06-07 DIAGNOSIS — M6281 Muscle weakness (generalized): Secondary | ICD-10-CM

## 2018-06-07 DIAGNOSIS — R262 Difficulty in walking, not elsewhere classified: Secondary | ICD-10-CM

## 2018-06-07 NOTE — Therapy (Signed)
[place] [facility] MAIN [facility] SERVICES [address] [place], [place], [address] Phone: [phone]   Fax:  [phone]  Physical Therapy Treatment  Patient Details  Name: [name] MRN: [id number] Date of Birth: 1961/10/03 Referring Provider: Dr. Karenann Cai   Encounter Date: 06/07/2018  PT End of Session - 06/07/18 0859    Visit Number  6    Number of Visits  8    Date for PT Re-Evaluation  06/27/18    PT Start Time  0844    PT Stop Time  0928    PT Time Calculation (min)  44 min    Equipment Utilized During Treatment  Gait belt bilateral AFO's    Activity Tolerance  Patient tolerated treatment well;Patient limited by fatigue    Behavior During Therapy  [facility] for tasks assessed/performed       Past Medical History:  Diagnosis Date  . Acute pain of right shoulder 05/19/2016  . Acute PN (pyelonephritis) 05/18/2016  . Acute pyelonephritis 05/18/2016  . Anxiety   . Arthritis   . Asthma   . Diabetes mellitus without complication (HCC)   . GERD (gastroesophageal reflux disease)   . Glaucoma   . Hyperlipemia   . Hyperlipemia   . Hypertension     Past Surgical History:  Procedure Laterality Date  . ANTERIOR CRUCIATE LIGAMENT REPAIR      There were no vitals filed for this visit.  Subjective Assessment - 06/07/18 0847    Subjective  Patient reports compliance with HEP at home, Having tingling of feet, not as bad as last session. No stumbles or falls.     Pertinent History  Patient presented in manual wheelchair with bilateral AFOs. Was seen by this therapist prior to admittance to hospital on 03/28/18 for sepsis.  April of 2017 patient came home from work reported not feeling good, The next couple days was not feeling good. Went to doctor who called EMS to take her to the hospital. Was unable to get a number on her sugars initially due to patient not taking her medicine due to not feeling well and not eating. Sugar was read to be in the  700's, also found multiple strains of a UTI/sepsis while there. Was in ICU for 12 days at [facility] then went to [facility]. While at [facility] was not walked or gotten up so after the fourth months there she returned home with in home therapy. Has not had any physical therapy since October. Gets around house with walker ok or in her wheelchair. Has pressure ulcer on Right buttocks with pmh including GERD, DM type II, HTN, and polyneuropathy. Patient wants to get stronger to be able to walk and stand again for more independence and to help her mother.      Limitations  Lifting;Standing;Walking;House hold activities;Other (comment)    How long can you sit comfortably?  hour    How long can you stand comfortably?  stand 5 minutes with walker     How long can you walk comfortably?  10 ft    Patient Stated Goals  walk better, want to drive, stand for longer, help her mom    Currently in Pain?  Yes    Pain Score  7     Pain Location  Foot    Pain Orientation  Right;Left    Pain Descriptors / Indicators  Tingling;Burning    Pain Type  Neuropathic pain    Pain Onset  More than a  month ago    Pain Frequency  Constant       Seated:             Marches BLE x15 with noted mild compensatory posterior trunk lean with raising LLE; compensation corrected by utilizing forward trunk lean to increase core activation.    Seated LAQ 10x 3 second holds, BLE    TrA seated activation 10x 3 second holds               Gluteal squeezes 25x  3 second holds              Hip abduction OTB 20x               Hip abduction squeezes ball 20x 3 seconds .    Standing   High knee marches in // bars 4x length of bars; BUE support , slight increase in pain in low back            Hip abduction 10x each leg BUE support     Standing hamstring curl 10x each leg; BUE support       Airex pad: static balance 3x30 seconds; occasional posterior LOB   Airex pad: 4" step toe taps with BUE support 10x each leg.      Step over and  back half foam roller 10x each leg, BUE support  Pt requires long rest breaks between sets and between exercises due to quickness to fatigue and overheating   Pt. response to medical necessity:  Patient will continue to benefit from skilled physical therapy to increase strength, mobility, and gait mechanics for increased quality of life                     PT Education - 06/07/18 0859    Education provided  Yes    Education Details  exercise technique     Person(s) Educated  Patient    Methods  Explanation;Demonstration;Verbal cues    Comprehension  Verbalized understanding;Returned demonstration;Need further instruction       PT Short Term Goals - 05/02/18 1736      PT SHORT TERM GOAL #1   Title  Patient will be independent in home exercise program to improve strength/mobility for better functional independence with ADLs.    Baseline  HEP added    Time  2    Period  Weeks    Status  New    Target Date  05/16/18      PT SHORT TERM GOAL #2   Title   Patient (< 22 years old) will complete five times sit to stand test in < 20 seconds indicating an increased LE strength and improved balance    Baseline  5/8: 39 seconds with excessive UE support. 5/    Time  2    Period  Weeks    Status  New    Target Date  05/16/18      PT SHORT TERM GOAL #3   Title  Patient will ambulate 50 ft without rest breaks to increase functional independence.     Baseline  ambulates 30 ft     Time  2    Period  Weeks    Status  New    Target Date  05/16/18      PT SHORT TERM GOAL #4   Title  Patient will increase BLE gross strength to 4-/5 as to improve functional strength for independent gait, increased standing tolerance and increased ADL ability.  Baseline  LLE 3/5 gross     Time  2    Period  Weeks    Status  New    Target Date  05/16/18        PT Long Term Goals - 05/02/18 1742      PT LONG TERM GOAL #1   Title  Patient will increase BLE gross strength to 4+/5 as  to improve functional strength for independent gait, increased standing tolerance and increased ADL ability.    Baseline  3/6 and 5/8: LLE gross 3/5    Time  8    Period  Weeks    Status  New    Target Date  06/27/18      PT LONG TERM GOAL #2   Title  Patient will increase lower extremity functional scale to >60/80 to demonstrate improved functional mobility and increased tolerance with ADLs.     Baseline  3/6: 24/80 5/8: 24/80    Time  8    Period  Weeks    Status  New    Target Date  06/27/18      PT LONG TERM GOAL #3   Title  Patient (< 16 years old) will complete five times sit to stand test in < 10 seconds indicating an increased LE strength and improved balance    Baseline  3/6: 46 seconds with UE support 5/8: 39 seconds with excessive UE support     Time  8    Period  Weeks    Status  New    Target Date  06/27/18      PT LONG TERM GOAL #4   Title  Patient will increase 10 meter walk test to >1.24m/s as to improve gait speed for better community ambulation and to reduce fall risk    Baseline  .38m/s     Time  8    Period  Weeks    Status  New    Target Date  06/27/18      PT LONG TERM GOAL #5   Title  Patient will increase ABC scale score >80% to demonstrate better functional mobility and better confidence with ADLs.     Baseline  5/8: 8.4%    Time  8    Period  Weeks    Status  New    Target Date  06/27/18            Plan - 06/07/18 0906    Clinical Impression Statement  Patient demonstrates occasional posterior LOB when standing on unsteady surface. Patient fatigues quickly losing breath and requiring water and seated rest breaks.  Patient had occasional low back fatigue resulting in improper standing postural control. Patient educated on TrA activation and its implementation into interventions. Patient will continue to benefit from skilled physical therapy to increase strength, mobility, and gait mechanics for increased quality of life    Rehab Potential  Fair     Clinical Impairments Affecting Rehab Potential  + good motivation, +performing HEP from previous home health -chronicity, body habitus     PT Frequency  1x / week    PT Duration  8 weeks    PT Treatment/Interventions  ADLs/Self Care Home Management;Cryotherapy;Ultrasound;Traction;Moist Heat;Electrical Stimulation;DME Instruction;Gait training;Stair training;Balance training;Therapeutic exercise;Therapeutic activities;Functional mobility training;Neuromuscular re-education;Patient/family education;Orthotic Fit/Training;Manual techniques;Wheelchair mobility training;Passive range of motion;Energy conservation;Taping    PT Next Visit Plan  ambulatory, LE strengthening.     PT Home Exercise Plan  added gluteal strenght and hamstring curls to HEP    Consulted  and Agree with Plan of Care  Patient       Patient will benefit from skilled therapeutic intervention in order to improve the following deficits and impairments:  Abnormal gait, Decreased activity tolerance, Decreased balance, Decreased endurance, Decreased knowledge of precautions, Decreased mobility, Decreased strength, Difficulty walking, Impaired perceived functional ability, Impaired sensation, Postural dysfunction, Improper body mechanics, Pain  Visit Diagnosis: Other abnormalities of gait and mobility  Difficulty in walking, not elsewhere classified  Muscle weakness (generalized)     Problem List Patient Active Problem List   Diagnosis Date Noted  . Sepsis (HCC) 04/02/2018  . 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 CKD (chronic kidney disease) stage 3, GFR 30-59 ml/min (HCC) 07/04/2017  . Class 3 drug-induced obesity with serious comorbidity and body mass index (BMI) of 45.0 to 49.9 in adult (HCC) 07/04/2017  . Chronic feet pain (Location of Primary Source of Pain) (Bilateral) (R>L) 04/25/2017  . Diabetic peripheral neuropathy (HCC) (Location of Primary Source of Pain) (Bilateral) (R>L) 04/25/2017  . Chronic lower  extremity pain (Location of Secondary source of pain) (Bilateral) (R>L) 04/25/2017  . Chronic pain syndrome 04/25/2017  . Long term (current) use of opiate analgesic 04/25/2017  . Long term prescription opiate use 04/25/2017  . Opiate use 04/25/2017  . Chronic hand pain (Location of Tertiary source of pain) (Bilateral) (L>R) 04/25/2017  . Carpal tunnel syndrome (Bilateral) (L>R) 04/25/2017  . History of stroke 04/25/2017  . Infected decubitus ulcer, unstageable (HCC) 10/26/2016  . Closed fracture of humerus, surgical neck 05/31/2016  . Closed 3-part fracture of surgical neck of right humerus with delayed healing 05/31/2016  . Pain in shoulder 05/19/2016  . Edema leg 05/18/2016  . 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 Kidney lump 05/18/2016  . Bilateral edema of lower extremity 05/18/2016  . Bilateral leg weakness 05/18/2016  . Kidney mass 05/18/2016  . Pressure ulcer 04/24/2016  . History of DKA (diabetic ketoacidosis) (HCC) 04/23/2016    Class: History of  . Diabetes mellitus (HCC) 04/23/2016  . Other specified diabetes mellitus with ketoacidosis without coma (HCC) 04/23/2016   Precious Bard, PT, DPT   06/07/2018, 9:31 AM  [facility] MAIN [facility] SERVICES [address] [place], [place], [address] Phone: [phone]   Fax:  [phone]  Name: [name] MRN: [id number] Date of Birth: March 01, 1961

## 2018-06-13 ENCOUNTER — Ambulatory Visit: Payer: BLUE CROSS/BLUE SHIELD

## 2018-06-13 DIAGNOSIS — R262 Difficulty in walking, not elsewhere classified: Secondary | ICD-10-CM

## 2018-06-13 DIAGNOSIS — R2689 Other abnormalities of gait and mobility: Secondary | ICD-10-CM | POA: Diagnosis not present

## 2018-06-13 DIAGNOSIS — M6281 Muscle weakness (generalized): Secondary | ICD-10-CM

## 2018-06-13 NOTE — Therapy (Signed)
[place] [facility] MAIN [facility] SERVICES [address] [place], [place], [address] Phone: [phone]   Fax:  [phone]  Physical Therapy Treatment  Patient Details  Name: [name] MRN: [id number] Date of Birth: 12/09/1961 Referring Provider: Dr. Karenann Cai   Encounter Date: 06/13/2018  PT End of Session - 06/13/18 0900    Visit Number  7    Number of Visits  8    Date for PT Re-Evaluation  06/27/18    PT Start Time  0845    PT Stop Time  0928    PT Time Calculation (min)  43 min    Equipment Utilized During Treatment  Gait belt bilateral AFO's    Activity Tolerance  Patient tolerated treatment well;Patient limited by fatigue    Behavior During Therapy  [facility] for tasks assessed/performed       Past Medical History:  Diagnosis Date  . Acute pain of right shoulder 05/19/2016  . Acute PN (pyelonephritis) 05/18/2016  . Acute pyelonephritis 05/18/2016  . Anxiety   . Arthritis   . Asthma   . Diabetes mellitus without complication (HCC)   . GERD (gastroesophageal reflux disease)   . Glaucoma   . Hyperlipemia   . Hyperlipemia   . Hypertension     Past Surgical History:  Procedure Laterality Date  . ANTERIOR CRUCIATE LIGAMENT REPAIR      There were no vitals filed for this visit.  Subjective Assessment - 06/13/18 0852    Subjective  Patient reports compliance with HEP, having tingling in feet per usual. No stumbles or falls.     Pertinent History  Patient presented in manual wheelchair with bilateral AFOs. Was seen by this therapist prior to admittance to hospital on 03/28/18 for sepsis.  April of 2017 patient came home from work reported not feeling good, The next couple days was not feeling good. Went to doctor who called EMS to take her to the hospital. Was unable to get a number on her sugars initially due to patient not taking her medicine due to not feeling well and not eating. Sugar was read to be in the 700's, also found multiple  strains of a UTI/sepsis while there. Was in ICU for 12 days at [facility] then went to [facility]. While at [facility] was not walked or gotten up so after the fourth months there she returned home with in home therapy. Has not had any physical therapy since October. Gets around house with walker ok or in her wheelchair. Has pressure ulcer on Right buttocks with pmh including GERD, DM type II, HTN, and polyneuropathy. Patient wants to get stronger to be able to walk and stand again for more independence and to help her mother.      Limitations  Lifting;Standing;Walking;House hold activities;Other (comment)    How long can you sit comfortably?  hour    How long can you stand comfortably?  stand 5 minutes with walker     How long can you walk comfortably?  10 ft    Patient Stated Goals  walk better, want to drive, stand for longer, help her mom    Currently in Pain?  Yes    Pain Score  6     Pain Location  Foot    Pain Orientation  Right;Left    Pain Descriptors / Indicators  Tingling    Pain Type  Neuropathic pain    Pain Onset  More than a month ago    Pain  Frequency  Constant         Stair negotiation (4 steps) : x2 assist with BUE support, cues for step to patterning up with good (R) , down with bad (L).   Seated:             Marches BLE x15 with noted mild compensatory posterior trunk lean with raising LLE; compensation corrected by utilizing forward trunk lean to increase core activation.                Seated LAQ 10x 3 second holds, BLE                TrA seated activation 10x 3 second holds                Gluteal squeezes 25x  3 second holds              Hip abduction OTB 15x 3 second holds               Hip adduction squeezes ball 15x 5 seconds .    Standing               High knee marches 10x each leg length of bars; BUE support , slight increase in pain in low back             Hip abduction 10x each leg BUE support        Airex pad: static balance 3x30 seconds; occasional  posterior LOB            Ambulate up and down // bars x 4 trials with BUE support ; good step length and clearance of BLE, excessive UE support with fatigue.    Pt requires long rest breaks between sets and between exercises due to quickness to fatigue and overheating   Pt. response to medical necessity:  Patient will continue to benefit from skilled physical therapy to increase strength, mobility, and gait mechanics for increased quality of life                         PT Education - 06/13/18 0859    Education provided  Yes    Education Details  exercise technique, stair negotiation     Person(s) Educated  Patient    Methods  Explanation;Demonstration;Tactile cues;Verbal cues    Comprehension  Verbalized understanding;Returned demonstration;Verbal cues required       PT Short Term Goals - 05/02/18 1736      PT SHORT TERM GOAL #1   Title  Patient will be independent in home exercise program to improve strength/mobility for better functional independence with ADLs.    Baseline  HEP added    Time  2    Period  Weeks    Status  New    Target Date  05/16/18      PT SHORT TERM GOAL #2   Title   Patient (< 68 years old) will complete five times sit to stand test in < 20 seconds indicating an increased LE strength and improved balance    Baseline  5/8: 39 seconds with excessive UE support. 5/    Time  2    Period  Weeks    Status  New    Target Date  05/16/18      PT SHORT TERM GOAL #3   Title  Patient will ambulate 50 ft without rest breaks to increase functional independence.     Baseline  ambulates 30  ft     Time  2    Period  Weeks    Status  New    Target Date  05/16/18      PT SHORT TERM GOAL #4   Title  Patient will increase BLE gross strength to 4-/5 as to improve functional strength for independent gait, increased standing tolerance and increased ADL ability.    Baseline  LLE 3/5 gross     Time  2    Period  Weeks    Status  New    Target Date   05/16/18        PT Long Term Goals - 05/02/18 1742      PT LONG TERM GOAL #1   Title  Patient will increase BLE gross strength to 4+/5 as to improve functional strength for independent gait, increased standing tolerance and increased ADL ability.    Baseline  3/6 and 5/8: LLE gross 3/5    Time  8    Period  Weeks    Status  New    Target Date  06/27/18      PT LONG TERM GOAL #2   Title  Patient will increase lower extremity functional scale to >60/80 to demonstrate improved functional mobility and increased tolerance with ADLs.     Baseline  3/6: 24/80 5/8: 24/80    Time  8    Period  Weeks    Status  New    Target Date  06/27/18      PT LONG TERM GOAL #3   Title  Patient (< 59 years old) will complete five times sit to stand test in < 10 seconds indicating an increased LE strength and improved balance    Baseline  3/6: 46 seconds with UE support 5/8: 39 seconds with excessive UE support     Time  8    Period  Weeks    Status  New    Target Date  06/27/18      PT LONG TERM GOAL #4   Title  Patient will increase 10 meter walk test to >1.42m/s as to improve gait speed for better community ambulation and to reduce fall risk    Baseline  .21m/s     Time  8    Period  Weeks    Status  New    Target Date  06/27/18      PT LONG TERM GOAL #5   Title  Patient will increase ABC scale score >80% to demonstrate better functional mobility and better confidence with ADLs.     Baseline  5/8: 8.4%    Time  8    Period  Weeks    Status  New    Target Date  06/27/18            Plan - 06/13/18 0907    Clinical Impression Statement  Patient demonstrated ability negotiate stairs with step to pattern (lead with R up, lead with L down) and BUE support. Heavy support from UE required at this time. Patient demonstrating improved stability on unstable surface at this time.  Patient will continue to benefit from skilled physical therapy to increase strength, mobility, and gait mechanics  for increased quality of life    Rehab Potential  Fair    Clinical Impairments Affecting Rehab Potential  + good motivation, +performing HEP from previous home health -chronicity, body habitus     PT Frequency  1x / week    PT Duration  8 weeks  PT Treatment/Interventions  ADLs/Self Care Home Management;Cryotherapy;Ultrasound;Traction;Moist Heat;Electrical Stimulation;DME Instruction;Gait training;Stair training;Balance training;Therapeutic exercise;Therapeutic activities;Functional mobility training;Neuromuscular re-education;Patient/family education;Orthotic Fit/Training;Manual techniques;Wheelchair mobility training;Passive range of motion;Energy conservation;Taping    PT Next Visit Plan  ambulatory, LE strengthening.     PT Home Exercise Plan  added gluteal strenght and hamstring curls to HEP    Consulted and Agree with Plan of Care  Patient       Patient will benefit from skilled therapeutic intervention in order to improve the following deficits and impairments:  Abnormal gait, Decreased activity tolerance, Decreased balance, Decreased endurance, Decreased knowledge of precautions, Decreased mobility, Decreased strength, Difficulty walking, Impaired perceived functional ability, Impaired sensation, Postural dysfunction, Improper body mechanics, Pain  Visit Diagnosis: Other abnormalities of gait and mobility  Difficulty in walking, not elsewhere classified  Muscle weakness (generalized)     Problem List Patient Active Problem List   Diagnosis Date Noted  . Sepsis (HCC) 04/02/2018  . 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 CKD (chronic kidney disease) stage 3, GFR 30-59 ml/min (HCC) 07/04/2017  . Class 3 drug-induced obesity with serious comorbidity and body mass index (BMI) of 45.0 to 49.9 in adult (HCC) 07/04/2017  . Chronic feet pain (Location of Primary Source of Pain) (Bilateral) (R>L) 04/25/2017  . Diabetic peripheral neuropathy (HCC) (Location of Primary Source  of Pain) (Bilateral) (R>L) 04/25/2017  . Chronic lower extremity pain (Location of Secondary source of pain) (Bilateral) (R>L) 04/25/2017  . Chronic pain syndrome 04/25/2017  . Long term (current) use of opiate analgesic 04/25/2017  . Long term prescription opiate use 04/25/2017  . Opiate use 04/25/2017  . Chronic hand pain (Location of Tertiary source of pain) (Bilateral) (L>R) 04/25/2017  . Carpal tunnel syndrome (Bilateral) (L>R) 04/25/2017  . History of stroke 04/25/2017  . Infected decubitus ulcer, unstageable (HCC) 10/26/2016  . Closed fracture of humerus, surgical neck 05/31/2016  . Closed 3-part fracture of surgical neck of right humerus with delayed healing 05/31/2016  . Pain in shoulder 05/19/2016  . Edema leg 05/18/2016  . 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 Kidney lump 05/18/2016  . Bilateral edema of lower extremity 05/18/2016  . Bilateral leg weakness 05/18/2016  . Kidney mass 05/18/2016  . Pressure ulcer 04/24/2016  . History of DKA (diabetic ketoacidosis) (HCC) 04/23/2016    Class: History of  . Diabetes mellitus (HCC) 04/23/2016  . Other specified diabetes mellitus with ketoacidosis without coma (HCC) 04/23/2016   Precious Bard, PT, DPT   06/13/2018, 9:30 AM  [place] [facility] MAIN [facility] SERVICES [address] [place], [place], [address] Phone: [phone]   Fax:  [phone]  Name: [name] MRN: [id number] Date of Birth: [birth year]

## 2018-06-20 ENCOUNTER — Ambulatory Visit: Payer: BLUE CROSS/BLUE SHIELD

## 2018-06-20 DIAGNOSIS — R2689 Other abnormalities of gait and mobility: Secondary | ICD-10-CM | POA: Diagnosis not present

## 2018-06-20 DIAGNOSIS — M6281 Muscle weakness (generalized): Secondary | ICD-10-CM

## 2018-06-20 DIAGNOSIS — R262 Difficulty in walking, not elsewhere classified: Secondary | ICD-10-CM

## 2018-06-20 NOTE — Therapy (Signed)
[place] MAIN [facility] SERVICES [address] [place], [place], [address] Phone: [phone]   Fax:  [phone]  Physical Therapy Treatment Physical Therapy Progress Note   Dates of reporting period  05/02/18   to   06/20/18  Patient Details  Name: [name] MRN: [id number] Date of Birth: Dec 12, 1961 Referring Provider: Dr. Willadean Carol   Encounter Date: 06/20/2018  PT End of Session - 06/20/18 0851    Visit Number  8    Number of Visits  16    Date for PT Re-Evaluation  08/15/18    PT Start Time  0845    PT Stop Time  0927    PT Time Calculation (min)  42 min    Equipment Utilized During Treatment  Gait belt bilateral AFO's    Activity Tolerance  Patient tolerated treatment well;Patient limited by fatigue    Behavior During Therapy  [facility] for tasks assessed/performed       Past Medical History:  Diagnosis Date  . Acute pain of right shoulder 05/19/2016  . Acute PN (pyelonephritis) 05/18/2016  . Acute pyelonephritis 05/18/2016  . Anxiety   . Arthritis   . Asthma   . Diabetes mellitus without complication ([place])   . GERD (gastroesophageal reflux disease)   . Glaucoma   . Hyperlipemia   . Hyperlipemia   . Hypertension     Past Surgical History:  Procedure Laterality Date  . ANTERIOR CRUCIATE LIGAMENT REPAIR      There were no vitals filed for this visit.  Subjective Assessment - 06/20/18 0850    Subjective  Patient reports not feeling good today, having some nausea. Had increased tingling of feet today, reports taking meds on time but had an exacerbation yesterday.     Pertinent History  Patient presented in manual wheelchair with bilateral AFOs. Was seen by this therapist prior to admittance to hospital on 03/28/18 for sepsis.  April of 2017 patient came home from work reported not feeling good, The next couple days was not feeling good. Went to doctor who called EMS to take her to the hospital. Was unable to get a number on  her sugars initially due to patient not taking her medicine due to not feeling well and not eating. Sugar was read to be in the 700's, also found multiple strains of a UTI/sepsis while there. Was in ICU for 12 days at [facility] then went to [facility]. While at [facility] was not walked or gotten up so after the fourth months there she returned home with in home therapy. Has not had any physical therapy since October. Gets around house with walker ok or in her wheelchair. Has pressure ulcer on Right buttocks with pmh including GERD, DM type II, HTN, and polyneuropathy. Patient wants to get stronger to be able to walk and stand again for more independence and to help her mother.      Limitations  Lifting;Standing;Walking;House hold activities;Other (comment)    How long can you sit comfortably?  hour    How long can you stand comfortably?  stand 5 minutes with walker     How long can you walk comfortably?  10 ft    Patient Stated Goals  walk better, want to drive, stand for longer, help her mom    Currently in Pain?  Yes    Pain Score  8     Pain Location  Foot    Pain Orientation  Right;Left    Pain  Descriptors / Indicators  Tingling    Pain Type  Neuropathic pain    Pain Onset  More than a month ago    Pain Frequency  Constant      Patient's condition has the potential to improve in response to therapy. Maximum improvement is yet to be obtained. The anticipated improvement is attainable and reasonable in a generally predictable time. Start date of reporting period 05/02/18 end date of reporting period 06/20/18. Patient reports improved ability to participate in ADLs and improved strength noted with decreased need for resting when performing tasks around house.   Nustep Lvl 1 3 minutes for cardiovascular support   5x STS: 18 seconds with excessive UE support  50 ft ambulation : 60 ft ambulated with one rest break  BLE strength: LLE 4-/5 LEFS: 33/80 10 MWT x2 trials: 24 seconds= .41 m/s  ABC:  44%  Seated 3lb ankle weights:  Marches 10x each leg   LAQ 10 x each leg    Pt. response to medical necessity:  Patient will continue to benefit from skilled physical therapy to increase strength, mobility, and gait mechanics for increased quality of life                       PT Education - 06/20/18 0851    Education provided  Yes    Education Details  exercise technique ; goal progression, POC    Person(s) Educated  Patient    Methods  Explanation;Demonstration;Verbal cues    Comprehension  Verbalized understanding;Returned demonstration       PT Short Term Goals - 06/20/18 0901      PT SHORT TERM GOAL #1   Title  Patient will be independent in home exercise program to improve strength/mobility for better functional independence with ADLs.    Baseline  HEP added    Time  2    Period  Weeks    Status  Partially Met    Target Date  07/04/18      PT SHORT TERM GOAL #2   Title   Patient (< 92 years old) will complete five times sit to stand test in < 20 seconds indicating an increased LE strength and improved balance    Baseline  5/8: 39 seconds with excessive UE support. 5/    Time  2    Period  Weeks    Status  New      PT SHORT TERM GOAL #3   Title  Patient will ambulate 50 ft without rest breaks to increase functional independence.     Baseline  ambulates 30 ft  6/26: 60 ft with one rest break with RW    Time  2    Period  Weeks    Status  Partially Met      PT SHORT TERM GOAL #4   Title  Patient will increase BLE gross strength to 4-/5 as to improve functional strength for independent gait, increased standing tolerance and increased ADL ability.    Baseline  LLE 3/5 gross : 6/26: LLE 4-/5    Time  2    Period  Weeks    Status  Achieved        PT Long Term Goals - 06/20/18 0902      PT LONG TERM GOAL #1   Title  Patient will increase BLE gross strength to 4+/5 as to improve functional strength for independent gait, increased standing  tolerance and increased ADL ability.  Baseline  3/6 and 5/8: LLE gross 3/5: 6/26: 4-/5    Time  8    Period  Weeks    Status  Partially Met    Target Date  08/15/18      PT LONG TERM GOAL #2   Title  Patient will increase lower extremity functional scale to >60/80 to demonstrate improved functional mobility and increased tolerance with ADLs.     Baseline  3/6: 24/80 5/8: 24/80 6/26: 33/80    Time  8    Period  Weeks    Status  Partially Met    Target Date  08/15/18      PT LONG TERM GOAL #3   Title  Patient (< 41 years old) will complete five times sit to stand test in < 10 seconds indicating an increased LE strength and improved balance    Baseline  3/6: 46 seconds with UE support 5/8: 39 seconds with excessive UE support ; 6/26: 18 seconds with excessive UE support with one LOB     Time  8    Period  Weeks    Status  Partially Met    Target Date  08/15/18      PT LONG TERM GOAL #4   Title  Patient will increase 10 meter walk test to >1.64ms as to improve gait speed for better community ambulation and to reduce fall risk    Baseline  .128m 6/26: .41 m/s    Time  8    Period  Weeks    Status  Partially Met      PT LONG TERM GOAL #5   Title  Patient will increase ABC scale score >80% to demonstrate better functional mobility and better confidence with ADLs.     Baseline  5/8: 8.4%; 6/26: 44%    Time  8    Period  Weeks    Status  Partially Met    Target Date  08/15/18            Plan - 06/20/18 0913    Clinical Impression Statement  Patient demonstrates improved ambulatory capacity and velocity performing 10 MWT in .41 m/s compared to previous .18 m/s. Patient improved Sit to stand by 21 seconds to 18 seconds with excessive use of UEs. Patient's LE strength is improving which is demonstrated in carryover to mobility and ambulatory capacity allowing patient to interact with environment. Patient's condition has the potential to improve in response to therapy. Maximum  improvement is yet to be obtained. The anticipated improvement is attainable and reasonable in a generally predictable time.Patient will continue to benefit from skilled physical therapy to increase strength, mobility, and gait mechanics for increased quality of life    Rehab Potential  Fair    Clinical Impairments Affecting Rehab Potential  + good motivation, +performing HEP from previous home health -chronicity, body habitus     PT Frequency  1x / week    PT Duration  8 weeks    PT Treatment/Interventions  ADLs/Self Care Home Management;Cryotherapy;Ultrasound;Traction;Moist Heat;Electrical Stimulation;DME Instruction;Gait training;Stair training;Balance training;Therapeutic exercise;Therapeutic activities;Functional mobility training;Neuromuscular re-education;Patient/family education;Orthotic Fit/Training;Manual techniques;Wheelchair mobility training;Passive range of motion;Energy conservation;Taping    PT Next Visit Plan  ambulatory, LE strengthening.     PT Home Exercise Plan  added gluteal strenght and hamstring curls to HEP    Consulted and Agree with Plan of Care  Patient       Patient will benefit from skilled therapeutic intervention in order to improve the following deficits and impairments:  Abnormal gait, Decreased activity tolerance, Decreased balance, Decreased endurance, Decreased knowledge of precautions, Decreased mobility, Decreased strength, Difficulty walking, Impaired perceived functional ability, Impaired sensation, Postural dysfunction, Improper body mechanics, Pain  Visit Diagnosis: Other abnormalities of gait and mobility  Difficulty in walking, not elsewhere classified  Muscle weakness (generalized)     Problem List Patient Active Problem List   Diagnosis Date Noted  . Sepsis ([place]) 04/02/2018  . 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 CKD (chronic kidney disease) stage 3, GFR 30-59 ml/min (HCC) 07/04/2017  . Class 3 drug-induced obesity with serious  comorbidity and body mass index (BMI) of 45.0 to 49.9 in adult ([place]) 07/04/2017  . Chronic feet pain (Location of Primary Source of Pain) (Bilateral) (R>L) 04/25/2017  . Diabetic peripheral neuropathy (HCC) (Location of Primary Source of Pain) (Bilateral) (R>L) 04/25/2017  . Chronic lower extremity pain (Location of Secondary source of pain) (Bilateral) (R>L) 04/25/2017  . Chronic pain syndrome 04/25/2017  . Long term (current) use of opiate analgesic 04/25/2017  . Long term prescription opiate use 04/25/2017  . Opiate use 04/25/2017  . Chronic hand pain (Location of Tertiary source of pain) (Bilateral) (L>R) 04/25/2017  . Carpal tunnel syndrome (Bilateral) (L>R) 04/25/2017  . History of stroke 04/25/2017  . Infected decubitus ulcer, unstageable ([place]) 10/26/2016  . Closed fracture of humerus, surgical neck 05/31/2016  . Closed 3-part fracture of surgical neck of right humerus with delayed healing 05/31/2016  . Pain in shoulder 05/19/2016  . Edema leg 05/18/2016  . 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 Kidney lump 05/18/2016  . Bilateral edema of lower extremity 05/18/2016  . Bilateral leg weakness 05/18/2016  . Kidney mass 05/18/2016  . Pressure ulcer 04/24/2016  . History of DKA (diabetic ketoacidosis) ([place]) 04/23/2016    Class: History of  . Diabetes mellitus ([place]) 04/23/2016  . Other specified diabetes mellitus with ketoacidosis without coma ([place]) 04/23/2016   Janna Arch, PT, DPT   06/20/2018, 9:27 AM  [place] MAIN [facility] SERVICES [place], [place], [address] Phone: [phone]   Fax:  [phone]  Name: Jennika Ringgold [place] MRN: [id number] Date of Birth: 02-07-1961

## 2018-06-26 ENCOUNTER — Ambulatory Visit: Payer: BLUE CROSS/BLUE SHIELD | Attending: Family Medicine

## 2018-06-26 DIAGNOSIS — R2689 Other abnormalities of gait and mobility: Secondary | ICD-10-CM | POA: Diagnosis present

## 2018-06-26 DIAGNOSIS — M6281 Muscle weakness (generalized): Secondary | ICD-10-CM | POA: Diagnosis present

## 2018-06-26 DIAGNOSIS — R262 Difficulty in walking, not elsewhere classified: Secondary | ICD-10-CM | POA: Diagnosis present

## 2018-06-26 NOTE — Therapy (Signed)
[place] MAIN [facility] SERVICES [address] [place], [place], [address] Phone: [phone]   Fax:  [phone]  Physical Therapy Treatment  Patient Details  Name: [name] MRN: [id number] Date of Birth: [birth year] Referring Provider: Dr. Willadean Carol   Encounter Date: 06/26/2018  PT End of Session - 06/26/18 0819    Visit Number  9    Number of Visits  16    Date for PT Re-Evaluation  08/15/18    PT Start Time  0800    PT Stop Time  0844    PT Time Calculation (min)  44 min    Equipment Utilized During Treatment  Gait belt bilateral AFO's    Activity Tolerance  Patient tolerated treatment well;Patient limited by fatigue    Behavior During Therapy  [facility] for tasks assessed/performed       Past Medical History:  Diagnosis Date  . Acute pain of right shoulder 05/19/2016  . Acute PN (pyelonephritis) 05/18/2016  . Acute pyelonephritis 05/18/2016  . Anxiety   . Arthritis   . Asthma   . Diabetes mellitus without complication ([place])   . GERD (gastroesophageal reflux disease)   . Glaucoma   . Hyperlipemia   . Hyperlipemia   . Hypertension     Past Surgical History:  Procedure Laterality Date  . ANTERIOR CRUCIATE LIGAMENT REPAIR      There were no vitals filed for this visit.  Subjective Assessment - 06/26/18 0804    Subjective  Patient reports wasn't feeling good yesterday but is feeling better today. Reports compliance with exercises. No stumbles or falls since last session.     Pertinent History  Patient presented in manual wheelchair with bilateral AFOs. Was seen by this therapist prior to admittance to hospital on 03/28/18 for sepsis.  April of 2017 patient came home from work reported not feeling good, The next couple days was not feeling good. Went to doctor who called EMS to take her to the hospital. Was unable to get a number on her sugars initially due to patient not taking her medicine due to not feeling well and not  eating. Sugar was read to be in the 700's, also found multiple strains of a UTI/sepsis while there. Was in ICU for 12 days at [facility] then went to [facility]. While at [facility] was not walked or gotten up so after the fourth months there she returned home with in home therapy. Has not had any physical therapy since October. Gets around house with walker ok or in her wheelchair. Has pressure ulcer on Right buttocks with pmh including GERD, DM type II, HTN, and polyneuropathy. Patient wants to get stronger to be able to walk and stand again for more independence and to help her mother.      Limitations  Lifting;Standing;Walking;House hold activities;Other (comment)    How long can you sit comfortably?  hour    How long can you stand comfortably?  stand 5 minutes with walker     How long can you walk comfortably?  10 ft    Patient Stated Goals  walk better, want to drive, stand for longer, help her mom    Currently in Pain?  Yes    Pain Score  6     Pain Location  Foot    Pain Orientation  Right;Left    Pain Descriptors / Indicators  Tingling    Pain Type  Neuropathic pain    Pain Onset  More than  a month ago    Pain Frequency  Constant        Stair negotiation (4 steps) : x2 assist with BUE support, cues for step to patterning up with good (R) , down with bad (L).    Seated:                     Seated LAQ 10x 3 second holds, BLE                TrA seated activation 25x 3 second holds                Gluteal squeezes 20x  5 second holds              Hip abduction OTB 15x 3 second holds               Hip adduction squeezes ball 15x 5 seconds .    Seated hamstring curl GTB 10x each leg    Standing   4" step toe taps 12x each leg, BUE support   4" step side toe taps 12x each leg, BUE support            Hip abduction 10x each leg BUE support   Hip extension 10x each leg BUE support, cues for bringing leg further behind.        Airex pad: static balance 3x30 seconds; occasional posterior  LOB; low back pain upon third trial. Cues for abdominal activation.             Ambulate up and down // bars x 4 trials with BUE support ; good step length and clearance of BLE, excessive UE support with fatigue. ; patient reports the turning around portion is more challenging.    Pt requires long rest breaks between sets and between exercises due to quickness to fatigue and overheating   Pt. response to medical necessity:  Patient will continue to benefit from skilled physical therapy to increase strength, mobility, and gait mechanics for increased quality of life                         PT Education - 06/26/18 0814    Education provided  Yes    Education Details  exercise technique, stair negotiation.     Person(s) Educated  Patient    Methods  Explanation;Demonstration;Verbal cues    Comprehension  Verbalized understanding;Returned demonstration       PT Short Term Goals - 06/20/18 0901      PT SHORT TERM GOAL #1   Title  Patient will be independent in home exercise program to improve strength/mobility for better functional independence with ADLs.    Baseline  HEP added    Time  2    Period  Weeks    Status  Partially Met    Target Date  07/04/18      PT SHORT TERM GOAL #2   Title   Patient (< 30 years old) will complete five times sit to stand test in < 20 seconds indicating an increased LE strength and improved balance    Baseline  5/8: 39 seconds with excessive UE support. 5/    Time  2    Period  Weeks    Status  New      PT SHORT TERM GOAL #3   Title  Patient will ambulate 50 ft without rest breaks to increase functional independence.     Baseline  ambulates 30 ft  6/26: 60 ft with one rest break with RW    Time  2    Period  Weeks    Status  Partially Met      PT SHORT TERM GOAL #4   Title  Patient will increase BLE gross strength to 4-/5 as to improve functional strength for independent gait, increased standing tolerance and increased ADL  ability.    Baseline  LLE 3/5 gross : 6/26: LLE 4-/5    Time  2    Period  Weeks    Status  Achieved        PT Long Term Goals - 06/20/18 0902      PT LONG TERM GOAL #1   Title  Patient will increase BLE gross strength to 4+/5 as to improve functional strength for independent gait, increased standing tolerance and increased ADL ability.    Baseline  3/6 and 5/8: LLE gross 3/5: 6/26: 4-/5    Time  8    Period  Weeks    Status  Partially Met    Target Date  08/15/18      PT LONG TERM GOAL #2   Title  Patient will increase lower extremity functional scale to >60/80 to demonstrate improved functional mobility and increased tolerance with ADLs.     Baseline  3/6: 24/80 5/8: 24/80 6/26: 33/80    Time  8    Period  Weeks    Status  Partially Met    Target Date  08/15/18      PT LONG TERM GOAL #3   Title  Patient (< 34 years old) will complete five times sit to stand test in < 10 seconds indicating an increased LE strength and improved balance    Baseline  3/6: 46 seconds with UE support 5/8: 39 seconds with excessive UE support ; 6/26: 18 seconds with excessive UE support with one LOB     Time  8    Period  Weeks    Status  Partially Met    Target Date  08/15/18      PT LONG TERM GOAL #4   Title  Patient will increase 10 meter walk test to >1.49ms as to improve gait speed for better community ambulation and to reduce fall risk    Baseline  .122m 6/26: .41 m/s    Time  8    Period  Weeks    Status  Partially Met      PT LONG TERM GOAL #5   Title  Patient will increase ABC scale score >80% to demonstrate better functional mobility and better confidence with ADLs.     Baseline  5/8: 8.4%; 6/26: 44%    Time  8    Period  Weeks    Status  Partially Met    Target Date  08/15/18            Plan - 06/26/18 0827    Clinical Impression Statement  Patient negotiating stairs with improved velocity and sequencing. Increased confidence in ability to negotiate stairs is noted  with decreased hesitation. Patient demonstrates improved capacity for toe taps with decreased cardiovascular challenge in // bars.  Improved stability on unstable surface noted with repetition.  Patient will continue to benefit from skilled physical therapy to increase strength, mobility, and gait mechanics for increased quality of life    Rehab Potential  Fair    Clinical Impairments Affecting Rehab Potential  + good motivation, +performing HEP from previous home  health -chronicity, body habitus     PT Frequency  1x / week    PT Duration  8 weeks    PT Treatment/Interventions  ADLs/Self Care Home Management;Cryotherapy;Ultrasound;Traction;Moist Heat;Electrical Stimulation;DME Instruction;Gait training;Stair training;Balance training;Therapeutic exercise;Therapeutic activities;Functional mobility training;Neuromuscular re-education;Patient/family education;Orthotic Fit/Training;Manual techniques;Wheelchair mobility training;Passive range of motion;Energy conservation;Taping    PT Next Visit Plan  ambulatory, LE strengthening.     PT Home Exercise Plan  added gluteal strenght and hamstring curls to HEP    Consulted and Agree with Plan of Care  Patient       Patient will benefit from skilled therapeutic intervention in order to improve the following deficits and impairments:  Abnormal gait, Decreased activity tolerance, Decreased balance, Decreased endurance, Decreased knowledge of precautions, Decreased mobility, Decreased strength, Difficulty walking, Impaired perceived functional ability, Impaired sensation, Postural dysfunction, Improper body mechanics, Pain  Visit Diagnosis: Other abnormalities of gait and mobility  Difficulty in walking, not elsewhere classified  Muscle weakness (generalized)     Problem List Patient Active Problem List   Diagnosis Date Noted  . Sepsis ([place]) 04/02/2018  . 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 CKD (chronic kidney disease) stage 3, GFR 30-59  ml/min (HCC) 07/04/2017  . Class 3 drug-induced obesity with serious comorbidity and body mass index (BMI) of 45.0 to 49.9 in adult ([place]) 07/04/2017  . Chronic feet pain (Location of Primary Source of Pain) (Bilateral) (R>L) 04/25/2017  . Diabetic peripheral neuropathy (HCC) (Location of Primary Source of Pain) (Bilateral) (R>L) 04/25/2017  . Chronic lower extremity pain (Location of Secondary source of pain) (Bilateral) (R>L) 04/25/2017  . Chronic pain syndrome 04/25/2017  . Long term (current) use of opiate analgesic 04/25/2017  . Long term prescription opiate use 04/25/2017  . Opiate use 04/25/2017  . Chronic hand pain (Location of Tertiary source of pain) (Bilateral) (L>R) 04/25/2017  . Carpal tunnel syndrome (Bilateral) (L>R) 04/25/2017  . History of stroke 04/25/2017  . Infected decubitus ulcer, unstageable ([place]) 10/26/2016  . Closed fracture of humerus, surgical neck 05/31/2016  . Closed 3-part fracture of surgical neck of right humerus with delayed healing 05/31/2016  . Pain in shoulder 05/19/2016  . Edema leg 05/18/2016  . Paraparesis ([place]) 05/18/2016  . Kidney lump 05/18/2016  . Bilateral edema of lower extremity 05/18/2016  . Bilateral leg weakness 05/18/2016  . Kidney mass 05/18/2016  . Pressure ulcer 04/24/2016  . History of DKA (diabetic ketoacidosis) ([place]) 04/23/2016    Class: History of  . Diabetes mellitus ([place]) 04/23/2016  . Other specified diabetes mellitus with ketoacidosis without coma ([place]) 04/23/2016   Janna Arch, PT, DPT   06/26/2018, 8:45 AM  [place] MAIN [facility] SERVICES [address] [place], [place], [address] Phone: [phone]   Fax:  [phone]  Name: [name] MRN: [id number] Date of Birth: October 13, 1961

## 2018-07-05 ENCOUNTER — Ambulatory Visit: Payer: BLUE CROSS/BLUE SHIELD

## 2018-07-05 DIAGNOSIS — M6281 Muscle weakness (generalized): Secondary | ICD-10-CM

## 2018-07-05 DIAGNOSIS — R2689 Other abnormalities of gait and mobility: Secondary | ICD-10-CM

## 2018-07-05 DIAGNOSIS — R262 Difficulty in walking, not elsewhere classified: Secondary | ICD-10-CM

## 2018-07-05 NOTE — Therapy (Signed)
[place] MAIN [facility] SERVICES [address] [place], [place], [address] Phone: [phone]   Fax:  [phone]  Physical Therapy Treatment  Patient Details  Name: [name] MRN: [id number] Date of Birth: Feb 17, 1961 Referring Provider: Dr. Willadean Carol   Encounter Date: 07/05/2018  PT End of Session - 07/05/18 0815    Visit Number  10    Number of Visits  16    Date for PT Re-Evaluation  08/15/18    Authorization Type  3/10 PN     PT Start Time  0758    PT Stop Time  0844    PT Time Calculation (min)  46 min    Equipment Utilized During Treatment  Gait belt bilateral AFO's    Activity Tolerance  Patient tolerated treatment well;Patient limited by fatigue    Behavior During Therapy  [facility] for tasks assessed/performed       Past Medical History:  Diagnosis Date  . Acute pain of right shoulder 05/19/2016  . Acute PN (pyelonephritis) 05/18/2016  . Acute pyelonephritis 05/18/2016  . Anxiety   . Arthritis   . Asthma   . Diabetes mellitus without complication ([name])   . GERD (gastroesophageal reflux disease)   . Glaucoma   . Hyperlipemia   . Hyperlipemia   . Hypertension     Past Surgical History:  Procedure Laterality Date  . ANTERIOR CRUCIATE LIGAMENT REPAIR      There were no vitals filed for this visit.  Subjective Assessment - 07/05/18 0802    Subjective  Patient reports her feet are feeling are numb today. Reports being sore after last session. Was sick on Sunday but feeling better now. No stumbles or falls.     Pertinent History  Patient presented in manual wheelchair with bilateral AFOs. Was seen by this therapist prior to admittance to hospital on 03/28/18 for sepsis.  April of 2017 patient came home from work reported not feeling good, The next couple days was not feeling good. Went to doctor who called EMS to take her to the hospital. Was unable to get a number on her sugars initially due to patient not taking her  medicine due to not feeling well and not eating. Sugar was read to be in the 700's, also found multiple strains of a UTI/sepsis while there. Was in ICU for 12 days at [facility] then went to [facility]. While at [facility] was not walked or gotten up so after the fourth months there she returned home with in home therapy. Has not had any physical therapy since October. Gets around house with walker ok or in her wheelchair. Has pressure ulcer on Right buttocks with pmh including GERD, DM type II, HTN, and polyneuropathy. Patient wants to get stronger to be able to walk and stand again for more independence and to help her mother.      Limitations  Lifting;Standing;Walking;House hold activities;Other (comment)    How long can you sit comfortably?  hour    How long can you stand comfortably?  stand 5 minutes with walker     How long can you walk comfortably?  10 ft    Patient Stated Goals  walk better, want to drive, stand for longer, help her mom    Currently in Pain?  Yes    Pain Score  7     Pain Location  Foot    Pain Orientation  Right;Left    Pain Descriptors / Indicators  Numbness  Pain Type  Neuropathic pain    Pain Onset  More than a month ago    Pain Frequency  Constant       Stair negotiation (4 steps) : x2 assist with BUE support, cues for step to patterning up with good (R) , down with bad (L).   Walk with RW x 40 ft with CGA ; WC follow    Seated:               Seated LAQ 10x 3 second holds, BLE                TrA seated activation 15x 3 second holds                Gluteal squeezes 15x  5 second holds              Hip abduction GTB 10x, one leg at t time, other leg remains stationary               Hip adduction squeezes ball 15x 5 seconds .                Seated hamstring curl GTB 10x each leg     Standing            Marches 10x each leg BUE support              Hip extension 10x each leg BUE support, cues for bringing leg further behind and straighten leg.    Cone taps  with PT directing color and leg 10x ; shaking of LEs and UE's with fatigue, did not knock over any cones        Airex pad: static balance 4x30 seconds; occasional posterior LOB;  Cues for abdominal activation and utilizing ankle/LE balance correction prior to trunk.    .    Alternate between seated and standing interventions to allow patient to recover  Pt requires long rest breaks between sets and between exercises due to quickness to fatigue and overheating   Pt. response to medical necessity:  Patient will continue to benefit from skilled physical therapy to increase strength, mobility, and gait mechanics for increased quality of life                          PT Education - 07/05/18 0815    Education provided  Yes    Education Details  exercise technique, stair negotiation, ambulation with RW     Person(s) Educated  Patient    Methods  Explanation;Demonstration;Verbal cues    Comprehension  Verbalized understanding;Returned demonstration       PT Short Term Goals - 06/20/18 0901      PT SHORT TERM GOAL #1   Title  Patient will be independent in home exercise program to improve strength/mobility for better functional independence with ADLs.    Baseline  HEP added    Time  2    Period  Weeks    Status  Partially Met    Target Date  07/04/18      PT SHORT TERM GOAL #2   Title   Patient (< 51 years old) will complete five times sit to stand test in < 20 seconds indicating an increased LE strength and improved balance    Baseline  5/8: 39 seconds with excessive UE support. 5/    Time  2    Period  Weeks    Status  New  PT SHORT TERM GOAL #3   Title  Patient will ambulate 50 ft without rest breaks to increase functional independence.     Baseline  ambulates 30 ft  6/26: 60 ft with one rest break with RW    Time  2    Period  Weeks    Status  Partially Met      PT SHORT TERM GOAL #4   Title  Patient will increase BLE gross strength to 4-/5 as to  improve functional strength for independent gait, increased standing tolerance and increased ADL ability.    Baseline  LLE 3/5 gross : 6/26: LLE 4-/5    Time  2    Period  Weeks    Status  Achieved        PT Long Term Goals - 06/20/18 0902      PT LONG TERM GOAL #1   Title  Patient will increase BLE gross strength to 4+/5 as to improve functional strength for independent gait, increased standing tolerance and increased ADL ability.    Baseline  3/6 and 5/8: LLE gross 3/5: 6/26: 4-/5    Time  8    Period  Weeks    Status  Partially Met    Target Date  08/15/18      PT LONG TERM GOAL #2   Title  Patient will increase lower extremity functional scale to >60/80 to demonstrate improved functional mobility and increased tolerance with ADLs.     Baseline  3/6: 24/80 5/8: 24/80 6/26: 33/80    Time  8    Period  Weeks    Status  Partially Met    Target Date  08/15/18      PT LONG TERM GOAL #3   Title  Patient (< 70 years old) will complete five times sit to stand test in < 10 seconds indicating an increased LE strength and improved balance    Baseline  3/6: 46 seconds with UE support 5/8: 39 seconds with excessive UE support ; 6/26: 18 seconds with excessive UE support with one LOB     Time  8    Period  Weeks    Status  Partially Met    Target Date  08/15/18      PT LONG TERM GOAL #4   Title  Patient will increase 10 meter walk test to >1.10ms as to improve gait speed for better community ambulation and to reduce fall risk    Baseline  .14m 6/26: .41 m/s    Time  8    Period  Weeks    Status  Partially Met      PT LONG TERM GOAL #5   Title  Patient will increase ABC scale score >80% to demonstrate better functional mobility and better confidence with ADLs.     Baseline  5/8: 8.4%; 6/26: 44%    Time  8    Period  Weeks    Status  Partially Met    Target Date  08/15/18            Plan - 07/05/18 0824    Clinical Impression Statement  Patient improving in ability to  negotiate stairs with decreased cueing and improved fluidity of eccentric control. Ambulation with RW has excessive lateral momentum and would benefit from continued focus. Patient utilizes trunk sway to maintain balance rather than ankle and hip stability first resulting in LOB.  Patient will continue to benefit from skilled physical therapy to increase strength, mobility, and gait  mechanics for increased quality of life    Rehab Potential  Fair    Clinical Impairments Affecting Rehab Potential  + good motivation, +performing HEP from previous home health -chronicity, body habitus     PT Frequency  1x / week    PT Duration  8 weeks    PT Treatment/Interventions  ADLs/Self Care Home Management;Cryotherapy;Ultrasound;Traction;Moist Heat;Electrical Stimulation;DME Instruction;Gait training;Stair training;Balance training;Therapeutic exercise;Therapeutic activities;Functional mobility training;Neuromuscular re-education;Patient/family education;Orthotic Fit/Training;Manual techniques;Wheelchair mobility training;Passive range of motion;Energy conservation;Taping    PT Next Visit Plan  ambulatory, LE strengthening.     PT Home Exercise Plan  added gluteal strenght and hamstring curls to HEP    Consulted and Agree with Plan of Care  Patient       Patient will benefit from skilled therapeutic intervention in order to improve the following deficits and impairments:  Abnormal gait, Decreased activity tolerance, Decreased balance, Decreased endurance, Decreased knowledge of precautions, Decreased mobility, Decreased strength, Difficulty walking, Impaired perceived functional ability, Impaired sensation, Postural dysfunction, Improper body mechanics, Pain  Visit Diagnosis: Other abnormalities of gait and mobility  Difficulty in walking, not elsewhere classified  Muscle weakness (generalized)     Problem List Patient Active Problem List   Diagnosis Date Noted  . Sepsis ([place]) 04/02/2018  . Mixed  sensory-motor polyneuropathy 08/11/2017  . CKD (chronic kidney disease) stage 3, GFR 30-59 ml/min (HCC) 07/04/2017  . Class 3 drug-induced obesity with serious comorbidity and body mass index (BMI) of 45.0 to 49.9 in adult ([place]) 07/04/2017  . Chronic feet pain (Location of Primary Source of Pain) (Bilateral) (R>L) 04/25/2017  . Diabetic peripheral neuropathy (HCC) (Location of Primary Source of Pain) (Bilateral) (R>L) 04/25/2017  . Chronic lower extremity pain (Location of Secondary source of pain) (Bilateral) (R>L) 04/25/2017  . Chronic pain syndrome 04/25/2017  . Long term (current) use of opiate analgesic 04/25/2017  . Long term prescription opiate use 04/25/2017  . Opiate use 04/25/2017  . Chronic hand pain (Location of Tertiary source of pain) (Bilateral) (L>R) 04/25/2017  . Carpal tunnel syndrome (Bilateral) (L>R) 04/25/2017  . History of stroke 04/25/2017  . Infected decubitus ulcer, unstageable ([place]) 10/26/2016  . Closed fracture of humerus, surgical neck 05/31/2016  . Closed 3-part fracture of surgical neck of right humerus with delayed healing 05/31/2016  . Pain in shoulder 05/19/2016  . Edema leg 05/18/2016  . 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 Kidney lump 05/18/2016  . Bilateral edema of lower extremity 05/18/2016  . Bilateral leg weakness 05/18/2016  . Kidney mass 05/18/2016  . Pressure ulcer 04/24/2016  . History of DKA (diabetic ketoacidosis) ([place]) 04/23/2016    Class: History of  . Diabetes mellitus ([place]) 04/23/2016  . Other specified diabetes mellitus with ketoacidosis without coma ([place]) 04/23/2016   Janna Arch, PT, DPT   07/05/2018, 8:46 AM  [place] MAIN [facility] SERVICES [place], [place], [address] Phone: [phone]   Fax:  [phone]  Name: Genesis Novosad Rigor MRN: [id number] Date of Birth: 1961-01-07

## 2018-07-12 ENCOUNTER — Ambulatory Visit: Payer: BLUE CROSS/BLUE SHIELD

## 2018-07-12 DIAGNOSIS — R2689 Other abnormalities of gait and mobility: Secondary | ICD-10-CM | POA: Diagnosis not present

## 2018-07-12 DIAGNOSIS — M6281 Muscle weakness (generalized): Secondary | ICD-10-CM

## 2018-07-12 DIAGNOSIS — R262 Difficulty in walking, not elsewhere classified: Secondary | ICD-10-CM

## 2018-07-12 NOTE — Therapy (Signed)
[place] MAIN [facility] SERVICES [address] [place], [place], [address] Phone: [phone]   Fax:  [phone]  Physical Therapy Treatment  Patient Details  Name: [name] MRN: [id number] Date of Birth: 06-06-1961 Referring Provider: Dr. Willadean Carol   Encounter Date: 07/12/2018  PT End of Session - 07/12/18 0910    Visit Number  11    Number of Visits  16    Date for PT Re-Evaluation  08/15/18    Authorization Type  4/10 PN     PT Start Time  0845    PT Stop Time  0929    PT Time Calculation (min)  44 min    Equipment Utilized During Treatment  Gait belt bilateral AFO's    Activity Tolerance  Patient tolerated treatment well;Patient limited by fatigue    Behavior During Therapy  [facility] for tasks assessed/performed       Past Medical History:  Diagnosis Date  . Acute pain of right shoulder 05/19/2016  . Acute PN (pyelonephritis) 05/18/2016  . Acute pyelonephritis 05/18/2016  . Anxiety   . Arthritis   . Asthma   . Diabetes mellitus without complication ([place])   . GERD (gastroesophageal reflux disease)   . Glaucoma   . Hyperlipemia   . Hyperlipemia   . Hypertension     Past Surgical History:  Procedure Laterality Date  . ANTERIOR CRUCIATE LIGAMENT REPAIR      There were no vitals filed for this visit.  Subjective Assessment - 07/12/18 0850    Subjective  Patient requests to focus on LLE due to feeling weaker in LLE. Has not been able to go to pool yet.     Pertinent History  Patient presented in manual wheelchair with bilateral AFOs. Was seen by this therapist prior to admittance to hospital on 03/28/18 for sepsis.  April of 2017 patient came home from work reported not feeling good, The next couple days was not feeling good. Went to doctor who called EMS to take her to the hospital. Was unable to get a number on her sugars initially due to patient not taking her medicine due to not feeling well and not eating. Sugar was read  to be in the 700's, also found multiple strains of a UTI/sepsis while there. Was in ICU for 12 days at [facility] then went to [facility]. While at [facility] was not walked or gotten up so after the fourth months there she returned home with in home therapy. Has not had any physical therapy since October. Gets around house with walker ok or in her wheelchair. Has pressure ulcer on Right buttocks with pmh including GERD, DM type II, HTN, and polyneuropathy. Patient wants to get stronger to be able to walk and stand again for more independence and to help her mother.      Limitations  Lifting;Standing;Walking;House hold activities;Other (comment)    How long can you sit comfortably?  hour    How long can you stand comfortably?  stand 5 minutes with walker     How long can you walk comfortably?  10 ft    Patient Stated Goals  walk better, want to drive, stand for longer, help her mom    Currently in Pain?  Yes    Pain Score  7     Pain Location  Foot    Pain Orientation  Right;Left    Pain Descriptors / Indicators  Numbness    Pain Type  Neuropathic pain  Pain Onset  More than a month ago    Pain Frequency  Constant       Sit to stands from raised plinth utilizing RW and CGA ; progressively decreasing height of table to recreate chair height ; 5x each height 15 x total. Cues for hand placement.     Seated:  Seated marches on plinth table 10x each leg, arms crossed to not allow compensatory patterning               Seated LAQ 10x 3 second holds, BLE ; 2 sets; LLE more challenged than RLE so second set RLE held for 5 seconds               TrA seated activation 15x 3 second holds                Gluteal squeezes 15x  5 second holds              Hip abduction GTB 10x, one leg at t time, other leg remains stationary; 2 sets                Hip adduction squeezes ball 15x 5 seconds .                Seated hamstring curl GTB 10x each leg ; 2 sets LLE.      Standing  SLR LLE 10x R and LLE, 2 sets  LLE,     Pt requires long rest breaks between sets and between exercises due to quickness to fatigue and overheating   Pt. response to medical necessity:  Patient will continue to benefit from skilled physical therapy to increase strength, mobility, and gait mechanics for increased quality of life                          PT Education - 07/12/18 0907    Education provided  Yes    Education Details  exercise technique, sit to stands    Person(s) Educated  Patient    Methods  Explanation;Demonstration;Verbal cues    Comprehension  Verbalized understanding;Returned demonstration       PT Short Term Goals - 06/20/18 0901      PT SHORT TERM GOAL #1   Title  Patient will be independent in home exercise program to improve strength/mobility for better functional independence with ADLs.    Baseline  HEP added    Time  2    Period  Weeks    Status  Partially Met    Target Date  07/04/18      PT SHORT TERM GOAL #2   Title   Patient (< 24 years old) will complete five times sit to stand test in < 20 seconds indicating an increased LE strength and improved balance    Baseline  5/8: 39 seconds with excessive UE support. 5/    Time  2    Period  Weeks    Status  New      PT SHORT TERM GOAL #3   Title  Patient will ambulate 50 ft without rest breaks to increase functional independence.     Baseline  ambulates 30 ft  6/26: 60 ft with one rest break with RW    Time  2    Period  Weeks    Status  Partially Met      PT SHORT TERM GOAL #4   Title  Patient will increase BLE gross strength to  4-/5 as to improve functional strength for independent gait, increased standing tolerance and increased ADL ability.    Baseline  LLE 3/5 gross : 6/26: LLE 4-/5    Time  2    Period  Weeks    Status  Achieved        PT Long Term Goals - 06/20/18 0902      PT LONG TERM GOAL #1   Title  Patient will increase BLE gross strength to 4+/5 as to improve functional strength for  independent gait, increased standing tolerance and increased ADL ability.    Baseline  3/6 and 5/8: LLE gross 3/5: 6/26: 4-/5    Time  8    Period  Weeks    Status  Partially Met    Target Date  08/15/18      PT LONG TERM GOAL #2   Title  Patient will increase lower extremity functional scale to >60/80 to demonstrate improved functional mobility and increased tolerance with ADLs.     Baseline  3/6: 24/80 5/8: 24/80 6/26: 33/80    Time  8    Period  Weeks    Status  Partially Met    Target Date  08/15/18      PT LONG TERM GOAL #3   Title  Patient (< 91 years old) will complete five times sit to stand test in < 10 seconds indicating an increased LE strength and improved balance    Baseline  3/6: 46 seconds with UE support 5/8: 39 seconds with excessive UE support ; 6/26: 18 seconds with excessive UE support with one LOB     Time  8    Period  Weeks    Status  Partially Met    Target Date  08/15/18      PT LONG TERM GOAL #4   Title  Patient will increase 10 meter walk test to >1.71ms as to improve gait speed for better community ambulation and to reduce fall risk    Baseline  .160m 6/26: .41 m/s    Time  8    Period  Weeks    Status  Partially Met      PT LONG TERM GOAL #5   Title  Patient will increase ABC scale score >80% to demonstrate better functional mobility and better confidence with ADLs.     Baseline  5/8: 8.4%; 6/26: 44%    Time  8    Period  Weeks    Status  Partially Met    Target Date  08/15/18            Plan - 07/12/18 [id number]  Clinical Impression Statement  Patient's session focused on sit to stand transfers from varying heights of surfaces with walker to promote independent mobility and allow patient to sit poolside with family. Patient requires cues for hand placement and LE coordination with patient compensating for LLE weakness by overexerting RLE.  Patient will continue to benefit from skilled physical therapy to increase strength, mobility, and gait  mechanics for increased quality of life    Rehab Potential  Fair    Clinical Impairments Affecting Rehab Potential  + good motivation, +performing HEP from previous home health -chronicity, body habitus     PT Frequency  1x / week    PT Duration  8 weeks    PT Treatment/Interventions  ADLs/Self Care Home Management;Cryotherapy;Ultrasound;Traction;Moist Heat;Electrical Stimulation;DME Instruction;Gait training;Stair training;Balance training;Therapeutic exercise;Therapeutic activities;Functional mobility training;Neuromuscular re-education;Patient/family education;Orthotic Fit/Training;Manual techniques;Wheelchair mobility training;Passive range of motion;Energy  conservation;Taping    PT Next Visit Plan  ambulatory, LE strengthening.     PT Home Exercise Plan  added gluteal strenght and hamstring curls to HEP    Consulted and Agree with Plan of Care  Patient       Patient will benefit from skilled therapeutic intervention in order to improve the following deficits and impairments:  Abnormal gait, Decreased activity tolerance, Decreased balance, Decreased endurance, Decreased knowledge of precautions, Decreased mobility, Decreased strength, Difficulty walking, Impaired perceived functional ability, Impaired sensation, Postural dysfunction, Improper body mechanics, Pain  Visit Diagnosis: Other abnormalities of gait and mobility  Difficulty in walking, not elsewhere classified  Muscle weakness (generalized)     Problem List Patient Active Problem List   Diagnosis Date Noted  . Sepsis ([place]) 04/02/2018  . 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 CKD (chronic kidney disease) stage 3, GFR 30-59 ml/min (HCC) 07/04/2017  . Class 3 drug-induced obesity with serious comorbidity and body mass index (BMI) of 45.0 to 49.9 in adult ([place]) 07/04/2017  . Chronic feet pain (Location of Primary Source of Pain) (Bilateral) (R>L) 04/25/2017  . Diabetic peripheral neuropathy (HCC) (Location of  Primary Source of Pain) (Bilateral) (R>L) 04/25/2017  . Chronic lower extremity pain (Location of Secondary source of pain) (Bilateral) (R>L) 04/25/2017  . Chronic pain syndrome 04/25/2017  . Long term (current) use of opiate analgesic 04/25/2017  . Long term prescription opiate use 04/25/2017  . Opiate use 04/25/2017  . Chronic hand pain (Location of Tertiary source of pain) (Bilateral) (L>R) 04/25/2017  . Carpal tunnel syndrome (Bilateral) (L>R) 04/25/2017  . History of stroke 04/25/2017  . Infected decubitus ulcer, unstageable ([place]) 10/26/2016  . Closed fracture of humerus, surgical neck 05/31/2016  . Closed 3-part fracture of surgical neck of right humerus with delayed healing 05/31/2016  . Pain in shoulder 05/19/2016  . Edema leg 05/18/2016  . Paraparesis ([place]) 05/18/2016  . Kidney lump 05/18/2016  . Bilateral edema of lower extremity 05/18/2016  . Bilateral leg weakness 05/18/2016  . Kidney mass 05/18/2016  . Pressure ulcer 04/24/2016  . History of DKA (diabetic ketoacidosis) ([place]) 04/23/2016    Class: History of  . Diabetes mellitus ([place]) 04/23/2016  . Other specified diabetes mellitus with ketoacidosis without coma ([place]) 04/23/2016   Janna Arch, PT, DPT   07/12/2018, 9:31 AM  [place] MAIN [facility] SERVICES [place], [place], [address] Phone: [phone]   Fax:  [phone]  Name: [name] MRN: [id number] Date of Birth: February 09, 1961

## 2018-07-19 ENCOUNTER — Ambulatory Visit: Payer: BLUE CROSS/BLUE SHIELD

## 2018-07-19 DIAGNOSIS — R2689 Other abnormalities of gait and mobility: Secondary | ICD-10-CM

## 2018-07-19 DIAGNOSIS — M6281 Muscle weakness (generalized): Secondary | ICD-10-CM

## 2018-07-19 DIAGNOSIS — R262 Difficulty in walking, not elsewhere classified: Secondary | ICD-10-CM

## 2018-07-19 NOTE — Therapy (Signed)
[place] MAIN [facility] SERVICES [address] [place], [place], [address] Phone: [phone]   Fax:  [phone]  Physical Therapy Treatment  Patient Details  Name: [name] MRN: [id number] Date of Birth: [birth year] Referring Provider: Dr. Willadean Carol   Encounter Date: 07/19/2018  PT End of Session - 07/19/18 0909    Visit Number  12    Number of Visits  16    Date for PT Re-Evaluation  08/15/18    Authorization Type  5/10 PN     PT Start Time  0847    PT Stop Time  0930    PT Time Calculation (min)  43 min    Equipment Utilized During Treatment  Gait belt bilateral AFO's    Activity Tolerance  Patient tolerated treatment well;Patient limited by fatigue    Behavior During Therapy  [facility] for tasks assessed/performed       Past Medical History:  Diagnosis Date  . Acute pain of right shoulder 05/19/2016  . Acute PN (pyelonephritis) 05/18/2016  . Acute pyelonephritis 05/18/2016  . Anxiety   . Arthritis   . Asthma   . Diabetes mellitus without complication ([place])   . GERD (gastroesophageal reflux disease)   . Glaucoma   . Hyperlipemia   . Hyperlipemia   . Hypertension     Past Surgical History:  Procedure Laterality Date  . ANTERIOR CRUCIATE LIGAMENT REPAIR      There were no vitals filed for this visit.  Subjective Assessment - 07/19/18 0855    Subjective  Patient reports feeling very sleepy today. Having a hard time staying awake due to getting up multiple times last night. Has been compliant with HEP. Started taking CBD oil for her feet at night    Pertinent History  Patient presented in manual wheelchair with bilateral AFOs. Was seen by this therapist prior to admittance to hospital on 03/28/18 for sepsis.  April of 2017 patient came home from work reported not feeling good, The next couple days was not feeling good. Went to doctor who called EMS to take her to the hospital. Was unable to get a number on her sugars initially  due to patient not taking her medicine due to not feeling well and not eating. Sugar was read to be in the 700's, also found multiple strains of a UTI/sepsis while there. Was in ICU for 12 days at [facility] then went to [facility]. While at [facility] was not walked or gotten up so after the fourth months there she returned home with in home therapy. Has not had any physical therapy since October. Gets around house with walker ok or in her wheelchair. Has pressure ulcer on Right buttocks with pmh including GERD, DM type II, HTN, and polyneuropathy. Patient wants to get stronger to be able to walk and stand again for more independence and to help her mother.      Limitations  Lifting;Standing;Walking;House hold activities;Other (comment)    How long can you sit comfortably?  hour    How long can you stand comfortably?  stand 5 minutes with walker     How long can you walk comfortably?  10 ft    Patient Stated Goals  walk better, want to drive, stand for longer, help her mom    Currently in Pain?  Yes    Pain Score  7     Pain Location  Foot    Pain Orientation  Right;Left    Pain Descriptors /  Indicators  Numbness    Pain Type  Neuropathic pain    Pain Onset  More than a month ago       Sit to stands from raised plinth utilizing RW and CGA ; progressively decreasing height of table to recreate chair height ; 10x each height 2 sets. Cues for hand placement.      Seated:             Seated marches on plinth table 10x each leg, arms crossed to not allow compensatory patterning                Seated LAQ 10x 3 second holds, BLE ; 2 sets; LLE more challenged than RLE so second set RLE held for 5 seconds               TrA seated activation 15x 5 second holds                Gluteal squeezes 15x  5 second holds              Hip abduction GTB 15x, one leg at t time, other leg remains stationary; 2 sets                Hip adduction squeezes ball 15x 5 seconds .     Marching with GTB around knees 10x each  leg               Seated hamstring curl GTB 10x each leg ; 2 sets LLE.     GTB step down LLE under foot 10x    Standing          Marches BUE support 10x each leg.    Standing hip extension 10x each leg    Pt requires long rest breaks between sets and between exercises due to quickness to fatigue and overheating   Pt. response to medical necessity:  Patient will continue to benefit from skilled physical therapy to increase strength, mobility, and gait mechanics for increased quality of life                      PT Education - 07/19/18 0856    Education provided  Yes    Education Details  exercise technique, sit to stand     Person(s) Educated  Patient    Methods  Explanation;Demonstration;Verbal cues    Comprehension  Verbalized understanding;Returned demonstration       PT Short Term Goals - 06/20/18 0901      PT SHORT TERM GOAL #1   Title  Patient will be independent in home exercise program to improve strength/mobility for better functional independence with ADLs.    Baseline  HEP added    Time  2    Period  Weeks    Status  Partially Met    Target Date  07/04/18      PT SHORT TERM GOAL #2   Title   Patient (< 23 years old) will complete five times sit to stand test in < 20 seconds indicating an increased LE strength and improved balance    Baseline  5/8: 39 seconds with excessive UE support. 5/    Time  2    Period  Weeks    Status  New      PT SHORT TERM GOAL #3   Title  Patient will ambulate 50 ft without rest breaks to increase functional independence.     Baseline  ambulates 30 ft  6/26: 65 ft with one rest break with RW    Time  2    Period  Weeks    Status  Partially Met      PT SHORT TERM GOAL #4   Title  Patient will increase BLE gross strength to 4-/5 as to improve functional strength for independent gait, increased standing tolerance and increased ADL ability.    Baseline  LLE 3/5 gross : 6/26: LLE 4-/5    Time  2    Period  Weeks     Status  Achieved        PT Long Term Goals - 06/20/18 0902      PT LONG TERM GOAL #1   Title  Patient will increase BLE gross strength to 4+/5 as to improve functional strength for independent gait, increased standing tolerance and increased ADL ability.    Baseline  3/6 and 5/8: LLE gross 3/5: 6/26: 4-/5    Time  8    Period  Weeks    Status  Partially Met    Target Date  08/15/18      PT LONG TERM GOAL #2   Title  Patient will increase lower extremity functional scale to >60/80 to demonstrate improved functional mobility and increased tolerance with ADLs.     Baseline  3/6: 24/80 5/8: 24/80 6/26: 33/80    Time  8    Period  Weeks    Status  Partially Met    Target Date  08/15/18      PT LONG TERM GOAL #3   Title  Patient (< 7 years old) will complete five times sit to stand test in < 10 seconds indicating an increased LE strength and improved balance    Baseline  3/6: 46 seconds with UE support 5/8: 39 seconds with excessive UE support ; 6/26: 18 seconds with excessive UE support with one LOB     Time  8    Period  Weeks    Status  Partially Met    Target Date  08/15/18      PT LONG TERM GOAL #4   Title  Patient will increase 10 meter walk test to >1.66ms as to improve gait speed for better community ambulation and to reduce fall risk    Baseline  .174m 6/26: .41 m/s    Time  8    Period  Weeks    Status  Partially Met      PT LONG TERM GOAL #5   Title  Patient will increase ABC scale score >80% to demonstrate better functional mobility and better confidence with ADLs.     Baseline  5/8: 8.4%; 6/26: 44%    Time  8    Period  Weeks    Status  Partially Met    Target Date  08/15/18            Plan - 07/19/18 0913    Clinical Impression Statement  Patient improving with sit to stand technique with increased lower extremity power and decreased need for UE support.  Sit to stand transfers fatiguing to patient reducing the amount of standing interventions able  to preform. Patient will continue to benefit from skilled physical therapy to increase strength, mobility, and gait mechanics for increased quality of life    Rehab Potential  Fair    Clinical Impairments Affecting Rehab Potential  + good motivation, +performing HEP from previous home health -chronicity, body habitus     PT Frequency  1x /  week    PT Duration  8 weeks    PT Treatment/Interventions  ADLs/Self Care Home Management;Cryotherapy;Ultrasound;Traction;Moist Heat;Electrical Stimulation;DME Instruction;Gait training;Stair training;Balance training;Therapeutic exercise;Therapeutic activities;Functional mobility training;Neuromuscular re-education;Patient/family education;Orthotic Fit/Training;Manual techniques;Wheelchair mobility training;Passive range of motion;Energy conservation;Taping    PT Next Visit Plan  ambulatory, LE strengthening.     PT Home Exercise Plan  added gluteal strenght and hamstring curls to HEP    Consulted and Agree with Plan of Care  Patient       Patient will benefit from skilled therapeutic intervention in order to improve the following deficits and impairments:  Abnormal gait, Decreased activity tolerance, Decreased balance, Decreased endurance, Decreased knowledge of precautions, Decreased mobility, Decreased strength, Difficulty walking, Impaired perceived functional ability, Impaired sensation, Postural dysfunction, Improper body mechanics, Pain  Visit Diagnosis: Other abnormalities of gait and mobility  Difficulty in walking, not elsewhere classified  Muscle weakness (generalized)     Problem List Patient Active Problem List   Diagnosis Date Noted  . Sepsis ([place]) 04/02/2018  . 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 CKD (chronic kidney disease) stage 3, GFR 30-59 ml/min (HCC) 07/04/2017  . Class 3 drug-induced obesity with serious comorbidity and body mass index (BMI) of 45.0 to 49.9 in adult ([place]) 07/04/2017  . Chronic feet pain (Location  of Primary Source of Pain) (Bilateral) (R>L) 04/25/2017  . Diabetic peripheral neuropathy (HCC) (Location of Primary Source of Pain) (Bilateral) (R>L) 04/25/2017  . Chronic lower extremity pain (Location of Secondary source of pain) (Bilateral) (R>L) 04/25/2017  . Chronic pain syndrome 04/25/2017  . Long term (current) use of opiate analgesic 04/25/2017  . Long term prescription opiate use 04/25/2017  . Opiate use 04/25/2017  . Chronic hand pain (Location of Tertiary source of pain) (Bilateral) (L>R) 04/25/2017  . Carpal tunnel syndrome (Bilateral) (L>R) 04/25/2017  . History of stroke 04/25/2017  . Infected decubitus ulcer, unstageable ([place]) 10/26/2016  . Closed fracture of humerus, surgical neck 05/31/2016  . Closed 3-part fracture of surgical neck of right humerus with delayed healing 05/31/2016  . Pain in shoulder 05/19/2016  . Edema leg 05/18/2016  . 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 Kidney lump 05/18/2016  . Bilateral edema of lower extremity 05/18/2016  . Bilateral leg weakness 05/18/2016  . Kidney mass 05/18/2016  . Pressure ulcer 04/24/2016  . History of DKA (diabetic ketoacidosis) ([place]) 04/23/2016    Class: History of  . Diabetes mellitus ([place]) 04/23/2016  . Other specified diabetes mellitus with ketoacidosis without coma ([place]) 04/23/2016   Janna Arch, PT, DPT    07/19/2018, 9:32 AM  [place] MAIN [facility] SERVICES [place], [place], [address] Phone: [phone]   Fax:  [phone]  Name: [name] MRN: [id number] Date of Birth: 08/03/1961

## 2018-07-26 ENCOUNTER — Ambulatory Visit: Payer: BLUE CROSS/BLUE SHIELD | Attending: Family Medicine

## 2018-07-26 DIAGNOSIS — R262 Difficulty in walking, not elsewhere classified: Secondary | ICD-10-CM | POA: Insufficient documentation

## 2018-07-26 DIAGNOSIS — M6281 Muscle weakness (generalized): Secondary | ICD-10-CM | POA: Diagnosis present

## 2018-07-26 DIAGNOSIS — R2689 Other abnormalities of gait and mobility: Secondary | ICD-10-CM | POA: Diagnosis not present

## 2018-07-26 NOTE — Therapy (Signed)
[place] MAIN [facility] SERVICES [address] [place], [place], [address] Phone: [phone]   Fax:  [phone]  Physical Therapy Treatment  Patient Details  Name: [name] MRN: [id number] Date of Birth: Sep 17, 1961 Referring Provider: Dr. Willadean Carol   Encounter Date: 07/26/2018  PT End of Session - 07/26/18 0900    Visit Number  13    Number of Visits  16    Date for PT Re-Evaluation  08/15/18    Authorization Type  6/10 PN     PT Start Time  0845    PT Stop Time  0931    PT Time Calculation (min)  46 min    Equipment Utilized During Treatment  Gait belt bilateral AFO's    Activity Tolerance  Patient tolerated treatment well;Patient limited by fatigue    Behavior During Therapy  [facility] for tasks assessed/performed       Past Medical History:  Diagnosis Date  . Acute pain of right shoulder 05/19/2016  . Acute PN (pyelonephritis) 05/18/2016  . Acute pyelonephritis 05/18/2016  . Anxiety   . Arthritis   . Asthma   . Diabetes mellitus without complication ([place])   . GERD (gastroesophageal reflux disease)   . Glaucoma   . Hyperlipemia   . Hyperlipemia   . Hypertension     Past Surgical History:  Procedure Laterality Date  . ANTERIOR CRUCIATE LIGAMENT REPAIR      There were no vitals filed for this visit.  Subjective Assessment - 07/26/18 0851    Subjective  Patient reports bus arrived early today rushing her to get ready. Had a hard time sleeping last night due to foot tingling/pain. CBD oil has not been helping.     Pertinent History  Patient presented in manual wheelchair with bilateral AFOs. Was seen by this therapist prior to admittance to hospital on 03/28/18 for sepsis.  April of 2017 patient came home from work reported not feeling good, The next couple days was not feeling good. Went to doctor who called EMS to take her to the hospital. Was unable to get a number on her sugars initially due to patient not taking her  medicine due to not feeling well and not eating. Sugar was read to be in the 700's, also found multiple strains of a UTI/sepsis while there. Was in ICU for 12 days at [facility] then went to [facility]. While at [facility] was not walked or gotten up so after the fourth months there she returned home with in home therapy. Has not had any physical therapy since October. Gets around house with walker ok or in her wheelchair. Has pressure ulcer on Right buttocks with pmh including GERD, DM type II, HTN, and polyneuropathy. Patient wants to get stronger to be able to walk and stand again for more independence and to help her mother.      Limitations  Lifting;Standing;Walking;House hold activities;Other (comment)    How long can you sit comfortably?  hour    How long can you stand comfortably?  stand 5 minutes with walker     How long can you walk comfortably?  10 ft    Patient Stated Goals  walk better, want to drive, stand for longer, help her mom    Currently in Pain?  Yes    Pain Score  8     Pain Location  Foot    Pain Orientation  Right;Left    Pain Descriptors / Indicators  Pins and  needles;Numbness    Pain Type  Neuropathic pain    Pain Onset  More than a month ago    Pain Frequency  Constant     Vitals after standing extensions: 114/52 pulse 99   Stair negotiation (4 steps) : x2 assist with BUE support, cues for step to patterning up with good (R) , down with bad (L).       Seated:   5lb ankle weights:               Seated LAQ 8x 2 second holds, BLE    Marches 10x each leg               TrA seated activation 15x 3 second holds                Gluteal squeezes 15x  3 second holds               Hip abduction GTB 10x, one leg at t time, other leg remains stationary               Hip adduction GTB 10x 2 second holds each leg .                Seated hamstring curl GTB 10x each leg     Weighted ball (2000 Gr) straight arm raises 10x   LAQ hold for 10 seconds 5x each leg  Standing               Hip extension 10x each leg BUE support, cues for bringing leg further behind and straighten leg.     Alternate between seated and standing interventions to allow patient to recover   Pt requires long rest breaks between sets and between exercises due to quickness to fatigue and overheating   Pt. response to medical necessity:  Patient will continue to benefit from skilled physical therapy to increase strength, mobility, and gait mechanics for increased quality of life                        PT Education - 07/26/18 0857    Education provided  Yes    Education Details  exercise technique, stair negotiation (up with good; down with bad)     Person(s) Educated  Patient    Methods  Explanation;Demonstration;Verbal cues    Comprehension  Verbalized understanding;Returned demonstration       PT Short Term Goals - 06/20/18 0901      PT SHORT TERM GOAL #1   Title  Patient will be independent in home exercise program to improve strength/mobility for better functional independence with ADLs.    Baseline  HEP added    Time  2    Period  Weeks    Status  Partially Met    Target Date  07/04/18      PT SHORT TERM GOAL #2   Title   Patient (< 64 years old) will complete five times sit to stand test in < 20 seconds indicating an increased LE strength and improved balance    Baseline  5/8: 39 seconds with excessive UE support. 5/    Time  2    Period  Weeks    Status  New      PT SHORT TERM GOAL #3   Title  Patient will ambulate 50 ft without rest breaks to increase functional independence.     Baseline  ambulates 30 ft  6/26: 60 ft with one  rest break with RW    Time  2    Period  Weeks    Status  Partially Met      PT SHORT TERM GOAL #4   Title  Patient will increase BLE gross strength to 4-/5 as to improve functional strength for independent gait, increased standing tolerance and increased ADL ability.    Baseline  LLE 3/5 gross : 6/26: LLE 4-/5    Time  2     Period  Weeks    Status  Achieved        PT Long Term Goals - 06/20/18 0902      PT LONG TERM GOAL #1   Title  Patient will increase BLE gross strength to 4+/5 as to improve functional strength for independent gait, increased standing tolerance and increased ADL ability.    Baseline  3/6 and 5/8: LLE gross 3/5: 6/26: 4-/5    Time  8    Period  Weeks    Status  Partially Met    Target Date  08/15/18      PT LONG TERM GOAL #2   Title  Patient will increase lower extremity functional scale to >60/80 to demonstrate improved functional mobility and increased tolerance with ADLs.     Baseline  3/6: 24/80 5/8: 24/80 6/26: 33/80    Time  8    Period  Weeks    Status  Partially Met    Target Date  08/15/18      PT LONG TERM GOAL #3   Title  Patient (< 78 years old) will complete five times sit to stand test in < 10 seconds indicating an increased LE strength and improved balance    Baseline  3/6: 46 seconds with UE support 5/8: 39 seconds with excessive UE support ; 6/26: 18 seconds with excessive UE support with one LOB     Time  8    Period  Weeks    Status  Partially Met    Target Date  08/15/18      PT LONG TERM GOAL #4   Title  Patient will increase 10 meter walk test to >1.63ms as to improve gait speed for better community ambulation and to reduce fall risk    Baseline  .11m 6/26: .41 m/s    Time  8    Period  Weeks    Status  Partially Met      PT LONG TERM GOAL #5   Title  Patient will increase ABC scale score >80% to demonstrate better functional mobility and better confidence with ADLs.     Baseline  5/8: 8.4%; 6/26: 44%    Time  8    Period  Weeks    Status  Partially Met    Target Date  08/15/18            Plan - 07/26/18 0911    Clinical Impression Statement  Patient fatigues quickly throughout session and was nauseated requiring prolonged breaks throughout session. Patient stair negotiation is challenging at first however improved with coordination  with second repetition. Seated interventions advanced with ankle weights to continue promoting strengthening interventions.  Patient will continue to benefit from skilled physical therapy to increase strength, mobility, and gait mechanics for increased quality of life    Rehab Potential  Fair    Clinical Impairments Affecting Rehab Potential  + good motivation, +performing HEP from previous home health -chronicity, body habitus     PT Frequency  1x / week  PT Duration  8 weeks    PT Treatment/Interventions  ADLs/Self Care Home Management;Cryotherapy;Ultrasound;Traction;Moist Heat;Electrical Stimulation;DME Instruction;Gait training;Stair training;Balance training;Therapeutic exercise;Therapeutic activities;Functional mobility training;Neuromuscular re-education;Patient/family education;Orthotic Fit/Training;Manual techniques;Wheelchair mobility training;Passive range of motion;Energy conservation;Taping    PT Next Visit Plan  ambulatory, LE strengthening.     PT Home Exercise Plan  added gluteal strenght and hamstring curls to HEP    Consulted and Agree with Plan of Care  Patient       Patient will benefit from skilled therapeutic intervention in order to improve the following deficits and impairments:  Abnormal gait, Decreased activity tolerance, Decreased balance, Decreased endurance, Decreased knowledge of precautions, Decreased mobility, Decreased strength, Difficulty walking, Impaired perceived functional ability, Impaired sensation, Postural dysfunction, Improper body mechanics, Pain  Visit Diagnosis: Other abnormalities of gait and mobility  Difficulty in walking, not elsewhere classified  Muscle weakness (generalized)     Problem List Patient Active Problem List   Diagnosis Date Noted  . Sepsis ([place]) 04/02/2018  . 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 CKD (chronic kidney disease) stage 3, GFR 30-59 ml/min (HCC) 07/04/2017  . Class 3 drug-induced obesity with serious  comorbidity and body mass index (BMI) of 45.0 to 49.9 in adult ([place]) 07/04/2017  . Chronic feet pain (Location of Primary Source of Pain) (Bilateral) (R>L) 04/25/2017  . Diabetic peripheral neuropathy (HCC) (Location of Primary Source of Pain) (Bilateral) (R>L) 04/25/2017  . Chronic lower extremity pain (Location of Secondary source of pain) (Bilateral) (R>L) 04/25/2017  . Chronic pain syndrome 04/25/2017  . Long term (current) use of opiate analgesic 04/25/2017  . Long term prescription opiate use 04/25/2017  . Opiate use 04/25/2017  . Chronic hand pain (Location of Tertiary source of pain) (Bilateral) (L>R) 04/25/2017  . Carpal tunnel syndrome (Bilateral) (L>R) 04/25/2017  . History of stroke 04/25/2017  . Infected decubitus ulcer, unstageable ([place]) 10/26/2016  . Closed fracture of humerus, surgical neck 05/31/2016  . Closed 3-part fracture of surgical neck of right humerus with delayed healing 05/31/2016  . Pain in shoulder 05/19/2016  . Edema leg 05/18/2016  . 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 Kidney lump 05/18/2016  . Bilateral edema of lower extremity 05/18/2016  . Bilateral leg weakness 05/18/2016  . Kidney mass 05/18/2016  . Pressure ulcer 04/24/2016  . History of DKA (diabetic ketoacidosis) ([place]) 04/23/2016    Class: History of  . Diabetes mellitus ([place]) 04/23/2016  . Other specified diabetes mellitus with ketoacidosis without coma ([place]) 04/23/2016   Janna Arch, PT, DPT   07/26/2018, 9:33 AM  [place] MAIN [facility] SERVICES [place], [place], [address] Phone: [phone]   Fax:  [phone]  Name: [name] MRN: [id number] Date of Birth: 1961/07/01

## 2018-07-30 ENCOUNTER — Ambulatory Visit: Payer: BLUE CROSS/BLUE SHIELD

## 2018-07-30 DIAGNOSIS — R2689 Other abnormalities of gait and mobility: Secondary | ICD-10-CM

## 2018-07-30 DIAGNOSIS — R262 Difficulty in walking, not elsewhere classified: Secondary | ICD-10-CM

## 2018-07-30 DIAGNOSIS — M6281 Muscle weakness (generalized): Secondary | ICD-10-CM

## 2018-07-30 NOTE — Therapy (Signed)
[place] MAIN [facility] SERVICES [address] [place], [place], [address] Phone: [phone]   Fax:  [phone]  Physical Therapy Treatment  Patient Details  Name: [name] MRN: [id number] Date of Birth: [birth year] Referring Provider: Dr. Willadean Carol   Encounter Date: 07/30/2018  PT End of Session - 07/30/18 0810    Visit Number  14    Number of Visits  16    Date for PT Re-Evaluation  08/15/18    Authorization Type  7/10 PN     PT Start Time  0759    PT Stop Time  0844    PT Time Calculation (min)  45 min    Equipment Utilized During Treatment  Gait belt bilateral AFO's    Activity Tolerance  Patient tolerated treatment well;Patient limited by fatigue    Behavior During Therapy  [facility] for tasks assessed/performed       Past Medical History:  Diagnosis Date  . Acute pain of right shoulder 05/19/2016  . Acute PN (pyelonephritis) 05/18/2016  . Acute pyelonephritis 05/18/2016  . Anxiety   . Arthritis   . Asthma   . Diabetes mellitus without complication ([place])   . GERD (gastroesophageal reflux disease)   . Glaucoma   . Hyperlipemia   . Hyperlipemia   . Hypertension     Past Surgical History:  Procedure Laterality Date  . ANTERIOR CRUCIATE LIGAMENT REPAIR      There were no vitals filed for this visit.  Subjective Assessment - 07/30/18 0802    Subjective  Patient reports feet are tingling more than normal this morning. Reports feeling ill this morning but better now. Been compliant with HEP.     Pertinent History  Patient presented in manual wheelchair with bilateral AFOs. Was seen by this therapist prior to admittance to hospital on 03/28/18 for sepsis.  April of 2017 patient came home from work reported not feeling good, The next couple days was not feeling good. Went to doctor who called EMS to take her to the hospital. Was unable to get a number on her sugars initially due to patient not taking her medicine due to not  feeling well and not eating. Sugar was read to be in the 700's, also found multiple strains of a UTI/sepsis while there. Was in ICU for 12 days at [facility] then went to [facility]. While at [facility] was not walked or gotten up so after the fourth months there she returned home with in home therapy. Has not had any physical therapy since October. Gets around house with walker ok or in her wheelchair. Has pressure ulcer on Right buttocks with pmh including GERD, DM type II, HTN, and polyneuropathy. Patient wants to get stronger to be able to walk and stand again for more independence and to help her mother.      Limitations  Lifting;Standing;Walking;House hold activities;Other (comment)    How long can you sit comfortably?  hour    How long can you stand comfortably?  stand 5 minutes with walker     How long can you walk comfortably?  10 ft    Patient Stated Goals  walk better, want to drive, stand for longer, help her mom    Currently in Pain?  Yes    Pain Score  7     Pain Location  Foot    Pain Orientation  Right;Left    Pain Descriptors / Indicators  Pins and needles    Pain Type  Neuropathic pain    Pain Onset  More than a month ago    Pain Frequency  Constant       Seated:             Marches BLE x15 with noted mild compensatory posterior trunk lean with raising LLE; compensation corrected by utilizing forward trunk lean to increase core activation.                Seated LAQ 10x 5 second holds, BLE                TrA seated activation 15x 3 second holds                Gluteal squeezes 15x  5 second holds               Hip abduction RTB 15x  each leg, one leg at a time, BLE (hold opp leg stable)                Hip adduction squeezes ball 15x 5 seconds .     Hamstring curl RTB 10x each leg, 3 second concentric 3 second eccentric   Standing               High knee marches 15x each leg BUE support , slight increase in pain in low back             Hip abduction 10x each leg BUE support         Airex pad: static balance 4x30 seconds; occasional posterior LOB            Ambulate up and down // bars x 6 trials with BUE support ; good step length and clearance of BLE, excessive UE support with fatigue. Decreased to single UE support for 2 laps.   Pt requires long rest breaks between sets and between exercises due to quickness to fatigue and overheating   Pt. response to medical necessity:  Patient will continue to benefit from skilled physical therapy to I;ncrease strength, mobility, and gait mechanics for increased quality of life                          PT Education - 07/30/18 0809    Education provided  Yes    Education Details  exercise technique     Person(s) Educated  Patient    Methods  Explanation;Demonstration;Verbal cues    Comprehension  Verbalized understanding;Returned demonstration       PT Short Term Goals - 06/20/18 0901      PT SHORT TERM GOAL #1   Title  Patient will be independent in home exercise program to improve strength/mobility for better functional independence with ADLs.    Baseline  HEP added    Time  2    Period  Weeks    Status  Partially Met    Target Date  07/04/18      PT SHORT TERM GOAL #2   Title   Patient (< 67 years old) will complete five times sit to stand test in < 20 seconds indicating an increased LE strength and improved balance    Baseline  5/8: 39 seconds with excessive UE support. 5/    Time  2    Period  Weeks    Status  New      PT SHORT TERM GOAL #3   Title  Patient will ambulate 50 ft without rest breaks to increase  functional independence.     Baseline  ambulates 30 ft  6/26: 60 ft with one rest break with RW    Time  2    Period  Weeks    Status  Partially Met      PT SHORT TERM GOAL #4   Title  Patient will increase BLE gross strength to 4-/5 as to improve functional strength for independent gait, increased standing tolerance and increased ADL ability.    Baseline  LLE 3/5 gross :  6/26: LLE 4-/5    Time  2    Period  Weeks    Status  Achieved        PT Long Term Goals - 06/20/18 0902      PT LONG TERM GOAL #1   Title  Patient will increase BLE gross strength to 4+/5 as to improve functional strength for independent gait, increased standing tolerance and increased ADL ability.    Baseline  3/6 and 5/8: LLE gross 3/5: 6/26: 4-/5    Time  8    Period  Weeks    Status  Partially Met    Target Date  08/15/18      PT LONG TERM GOAL #2   Title  Patient will increase lower extremity functional scale to >60/80 to demonstrate improved functional mobility and increased tolerance with ADLs.     Baseline  3/6: 24/80 5/8: 24/80 6/26: 33/80    Time  8    Period  Weeks    Status  Partially Met    Target Date  08/15/18      PT LONG TERM GOAL #3   Title  Patient (< 4 years old) will complete five times sit to stand test in < 10 seconds indicating an increased LE strength and improved balance    Baseline  3/6: 46 seconds with UE support 5/8: 39 seconds with excessive UE support ; 6/26: 18 seconds with excessive UE support with one LOB     Time  8    Period  Weeks    Status  Partially Met    Target Date  08/15/18      PT LONG TERM GOAL #4   Title  Patient will increase 10 meter walk test to >1.102ms as to improve gait speed for better community ambulation and to reduce fall risk    Baseline  .142m 6/26: .41 m/s    Time  8    Period  Weeks    Status  Partially Met      PT LONG TERM GOAL #5   Title  Patient will increase ABC scale score >80% to demonstrate better functional mobility and better confidence with ADLs.     Baseline  5/8: 8.4%; 6/26: 44%    Time  8    Period  Weeks    Status  Partially Met    Target Date  08/15/18            Plan - 07/30/18 0816    Clinical Impression Statement  Patient demonstrates improved stability on unstable surface during static balance interventions however fatigue resulted in posterior LOB with repetition. Patient  demonstrated improved gait and stability with SUE support. Patient will continue to benefit from skilled physical therapy to increase strength, mobility, and gait mechanics for increased quality of life    Rehab Potential  Fair    Clinical Impairments Affecting Rehab Potential  + good motivation, +performing HEP from previous home health -chronicity, body habitus  PT Frequency  1x / week    PT Duration  8 weeks    PT Treatment/Interventions  ADLs/Self Care Home Management;Cryotherapy;Ultrasound;Traction;Moist Heat;Electrical Stimulation;DME Instruction;Gait training;Stair training;Balance training;Therapeutic exercise;Therapeutic activities;Functional mobility training;Neuromuscular re-education;Patient/family education;Orthotic Fit/Training;Manual techniques;Wheelchair mobility training;Passive range of motion;Energy conservation;Taping    PT Next Visit Plan  ambulatory, LE strengthening.     PT Home Exercise Plan  added gluteal strenght and hamstring curls to HEP    Consulted and Agree with Plan of Care  Patient       Patient will benefit from skilled therapeutic intervention in order to improve the following deficits and impairments:  Abnormal gait, Decreased activity tolerance, Decreased balance, Decreased endurance, Decreased knowledge of precautions, Decreased mobility, Decreased strength, Difficulty walking, Impaired perceived functional ability, Impaired sensation, Postural dysfunction, Improper body mechanics, Pain  Visit Diagnosis: Other abnormalities of gait and mobility  Difficulty in walking, not elsewhere classified  Muscle weakness (generalized)     Problem List Patient Active Problem List   Diagnosis Date Noted  . Sepsis ([place]) 04/02/2018  . 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 CKD (chronic kidney disease) stage 3, GFR 30-59 ml/min (HCC) 07/04/2017  . Class 3 drug-induced obesity with serious comorbidity and body mass index (BMI) of 45.0 to 49.9 in adult  ([place]) 07/04/2017  . Chronic feet pain (Location of Primary Source of Pain) (Bilateral) (R>L) 04/25/2017  . Diabetic peripheral neuropathy (HCC) (Location of Primary Source of Pain) (Bilateral) (R>L) 04/25/2017  . Chronic lower extremity pain (Location of Secondary source of pain) (Bilateral) (R>L) 04/25/2017  . Chronic pain syndrome 04/25/2017  . Long term (current) use of opiate analgesic 04/25/2017  . Long term prescription opiate use 04/25/2017  . Opiate use 04/25/2017  . Chronic hand pain (Location of Tertiary source of pain) (Bilateral) (L>R) 04/25/2017  . Carpal tunnel syndrome (Bilateral) (L>R) 04/25/2017  . History of stroke 04/25/2017  . Infected decubitus ulcer, unstageable ([place]) 10/26/2016  . Closed fracture of humerus, surgical neck 05/31/2016  . Closed 3-part fracture of surgical neck of right humerus with delayed healing 05/31/2016  . Pain in shoulder 05/19/2016  . Edema leg 05/18/2016  . 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 Kidney lump 05/18/2016  . Bilateral edema of lower extremity 05/18/2016  . Bilateral leg weakness 05/18/2016  . Kidney mass 05/18/2016  . Pressure ulcer 04/24/2016  . History of DKA (diabetic ketoacidosis) ([place]) 04/23/2016    Class: History of  . Diabetes mellitus ([place]) 04/23/2016  . Other specified diabetes mellitus with ketoacidosis without coma ([place]) 04/23/2016   Janna Arch, PT, DPT   07/30/2018, 8:45 AM  [place] MAIN [facility] SERVICES [address] [place], [place], [address] Phone: [phone]   Fax:  [phone]  Name: [name] MRN: [id number] Date of Birth: 03-04-1961

## 2018-08-02 ENCOUNTER — Ambulatory Visit: Payer: BLUE CROSS/BLUE SHIELD

## 2018-08-06 ENCOUNTER — Ambulatory Visit: Payer: BLUE CROSS/BLUE SHIELD

## 2018-08-06 DIAGNOSIS — M6281 Muscle weakness (generalized): Secondary | ICD-10-CM

## 2018-08-06 DIAGNOSIS — R2689 Other abnormalities of gait and mobility: Secondary | ICD-10-CM

## 2018-08-06 DIAGNOSIS — R262 Difficulty in walking, not elsewhere classified: Secondary | ICD-10-CM

## 2018-08-06 NOTE — Therapy (Signed)
[place] MAIN [facility] SERVICES [address] [place], [place], [address] Phone: [phone]   Fax:  [phone]  Physical Therapy Treatment  Patient Details  Name: [name] MRN: [id number] Date of Birth: 05-Dec-1961 Referring Provider: Dr. Willadean Carol   Encounter Date: 08/06/2018  PT End of Session - 08/06/18 0859    Visit Number  15    Number of Visits  16    Date for PT Re-Evaluation  08/15/18    Authorization Type  8/10 PN     PT Start Time  0845    PT Stop Time  0930    PT Time Calculation (min)  45 min    Equipment Utilized During Treatment  Gait belt   bilateral AFO's   Activity Tolerance  Patient tolerated treatment well;Patient limited by fatigue    Behavior During Therapy  [facility] for tasks assessed/performed          Past Medical History:  Diagnosis Date  . Acute pain of right shoulder 05/19/2016  . Acute PN (pyelonephritis) 05/18/2016  . Acute pyelonephritis 05/18/2016  . Anxiety   . Arthritis   . Asthma   . Diabetes mellitus without complication ([place])   . GERD (gastroesophageal reflux disease)   . Glaucoma   . Hyperlipemia   . Hyperlipemia   . Hypertension     Past Surgical History:  Procedure Laterality Date  . ANTERIOR CRUCIATE LIGAMENT REPAIR      There were no vitals filed for this visit.  Subjective Assessment - 08/06/18 0849    Subjective  Patient reports having a severe hot flash. Thought she was done with menopause but now is not sure. Compliant with HEP.     Pertinent History  Patient presented in manual wheelchair with bilateral AFOs. Was seen by this therapist prior to admittance to hospital on 03/28/18 for sepsis.  April of 2017 patient came home from work reported not feeling good, The next couple days was not feeling good. Went to doctor who called EMS to take her to the hospital. Was unable to get a number on her sugars initially due to patient not taking her medicine due to not feeling well and  not eating. Sugar was read to be in the 700's, also found multiple strains of a UTI/sepsis while there. Was in ICU for 12 days at [facility] then went to [facility]. While at [facility] was not walked or gotten up so after the fourth months there she returned home with in home therapy. Has not had any physical therapy since October. Gets around house with walker ok or in her wheelchair. Has pressure ulcer on Right buttocks with pmh including GERD, DM type II, HTN, and polyneuropathy. Patient wants to get stronger to be able to walk and stand again for more independence and to help her mother.      Limitations  Lifting;Standing;Walking;House hold activities;Other (comment)    How long can you sit comfortably?  hour    How long can you stand comfortably?  stand 5 minutes with walker     How long can you walk comfortably?  10 ft    Patient Stated Goals  walk better, want to drive, stand for longer, help her mom    Currently in Pain?  Yes    Pain Score  7     Pain Location  Foot    Pain Orientation  Right;Left    Pain Descriptors / Indicators  Pins and needles;Numbness  Pain Type  Neuropathic pain    Pain Onset  More than a month ago    Pain Frequency  Constant         Vitals: 110/56 pulse 97     Seated:             Marches BLE x15 with noted mild compensatory posterior trunk lean with raising LLE; compensation corrected by utilizing forward trunk lean to increase core activation. ; 2lb ankle weights               Seated LAQ 15x 5 second holds, BLE ; 2lb ankle weights                TrA seated activation 10x 5 second holds                Gluteal squeezes 15x  5 second holds                Hip abduction RTB 15x  each leg, one leg at a time, BLE (hold opp leg stable)                Hip adduction squeezes ball 15x 5 seconds .                           Hamstring curl RTB 10x each leg, 3 second concentric 3 second eccentric   Standing               High knee marches 15x each leg BUE support ,  slight increase in pain in low back     Hamstring curls 10x each leg BUE support; LLE fatigued quicker than RLE.            Hip abduction 10x each leg BUE support : terminated due discomfort in R hip.        Airex pad: static balance 3x30 seconds; occasional posterior LOB            4" step toe taps BUE support 10x each leg ; cues for keeping feet apart   Pt requires long rest breaks between sets and between exercises due to quickness to fatigue and overheating; seated ice pack use.    Pt. response to medical necessity:  Patient will continue to benefit from skilled physical therapy to I;ncrease strength, mobility, and gait mechanics for increased quality of life                   PT Education - 08/06/18 0850    Education provided  Yes    Education Details  exercise technique     Person(s) Educated  Patient    Methods  Explanation;Demonstration;Verbal cues    Comprehension  Verbalized understanding;Returned demonstration       PT Short Term Goals - 06/20/18 0901      PT SHORT TERM GOAL #1   Title  Patient will be independent in home exercise program to improve strength/mobility for better functional independence with ADLs.    Baseline  HEP added    Time  2    Period  Weeks    Status  Partially Met    Target Date  07/04/18      PT SHORT TERM GOAL #2   Title   Patient (< 75 years old) will complete five times sit to stand test in < 20 seconds indicating an increased LE strength and improved balance    Baseline  5/8: 39 seconds with excessive  UE support. 5/    Time  2    Period  Weeks    Status  New      PT SHORT TERM GOAL #3   Title  Patient will ambulate 50 ft without rest breaks to increase functional independence.     Baseline  ambulates 30 ft  6/26: 60 ft with one rest break with RW    Time  2    Period  Weeks    Status  Partially Met      PT SHORT TERM GOAL #4   Title  Patient will increase BLE gross strength to 4-/5 as to improve functional  strength for independent gait, increased standing tolerance and increased ADL ability.    Baseline  LLE 3/5 gross : 6/26: LLE 4-/5    Time  2    Period  Weeks    Status  Achieved        PT Long Term Goals - 06/20/18 0902      PT LONG TERM GOAL #1   Title  Patient will increase BLE gross strength to 4+/5 as to improve functional strength for independent gait, increased standing tolerance and increased ADL ability.    Baseline  3/6 and 5/8: LLE gross 3/5: 6/26: 4-/5    Time  8    Period  Weeks    Status  Partially Met    Target Date  08/15/18      PT LONG TERM GOAL #2   Title  Patient will increase lower extremity functional scale to >60/80 to demonstrate improved functional mobility and increased tolerance with ADLs.     Baseline  3/6: 24/80 5/8: 24/80 6/26: 33/80    Time  8    Period  Weeks    Status  Partially Met    Target Date  08/15/18      PT LONG TERM GOAL #3   Title  Patient (< 31 years old) will complete five times sit to stand test in < 10 seconds indicating an increased LE strength and improved balance    Baseline  3/6: 46 seconds with UE support 5/8: 39 seconds with excessive UE support ; 6/26: 18 seconds with excessive UE support with one LOB     Time  8    Period  Weeks    Status  Partially Met    Target Date  08/15/18      PT LONG TERM GOAL #4   Title  Patient will increase 10 meter walk test to >1.50ms as to improve gait speed for better community ambulation and to reduce fall risk    Baseline  .153m 6/26: .41 m/s    Time  8    Period  Weeks    Status  Partially Met      PT LONG TERM GOAL #5   Title  Patient will increase ABC scale score >80% to demonstrate better functional mobility and better confidence with ADLs.     Baseline  5/8: 8.4%; 6/26: 44%    Time  8    Period  Weeks    Status  Partially Met    Target Date  08/15/18            Plan - 08/06/18 0926    Clinical Impression Statement  Patient had more episodes of heat fatigue this  session requiring seated and standing interventions to be alternated for increased intervention performance. LLE fatigues quicker than RLE with decreased stability with prolonged standing.  Patient will continue  to benefit from skilled physical therapy to I;ncrease strength, mobility, and gait mechanics for increased quality of life    Rehab Potential  Fair    Clinical Impairments Affecting Rehab Potential  + good motivation, +performing HEP from previous home health -chronicity, body habitus     PT Frequency  1x / week    PT Duration  8 weeks    PT Treatment/Interventions  ADLs/Self Care Home Management;Cryotherapy;Ultrasound;Traction;Moist Heat;Electrical Stimulation;DME Instruction;Gait training;Stair training;Balance training;Therapeutic exercise;Therapeutic activities;Functional mobility training;Neuromuscular re-education;Patient/family education;Orthotic Fit/Training;Manual techniques;Wheelchair mobility training;Passive range of motion;Energy conservation;Taping    PT Next Visit Plan  ambulatory, LE strengthening.     PT Home Exercise Plan  added gluteal strenght and hamstring curls to HEP    Consulted and Agree with Plan of Care  Patient       Patient will benefit from skilled therapeutic intervention in order to improve the following deficits and impairments:  Abnormal gait, Decreased activity tolerance, Decreased balance, Decreased endurance, Decreased knowledge of precautions, Decreased mobility, Decreased strength, Difficulty walking, Impaired perceived functional ability, Impaired sensation, Postural dysfunction, Improper body mechanics, Pain  Visit Diagnosis: Other abnormalities of gait and mobility  Difficulty in walking, not elsewhere classified  Muscle weakness (generalized)     Problem List Patient Active Problem List   Diagnosis Date Noted  . Sepsis ([place]) 04/02/2018  . 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 CKD (chronic kidney disease) stage 3, GFR 30-59  ml/min (HCC) 07/04/2017  . Class 3 drug-induced obesity with serious comorbidity and body mass index (BMI) of 45.0 to 49.9 in adult ([place]) 07/04/2017  . Chronic feet pain (Location of Primary Source of Pain) (Bilateral) (R>L) 04/25/2017  . Diabetic peripheral neuropathy (HCC) (Location of Primary Source of Pain) (Bilateral) (R>L) 04/25/2017  . Chronic lower extremity pain (Location of Secondary source of pain) (Bilateral) (R>L) 04/25/2017  . Chronic pain syndrome 04/25/2017  . Long term (current) use of opiate analgesic 04/25/2017  . Long term prescription opiate use 04/25/2017  . Opiate use 04/25/2017  . Chronic hand pain (Location of Tertiary source of pain) (Bilateral) (L>R) 04/25/2017  . Carpal tunnel syndrome (Bilateral) (L>R) 04/25/2017  . History of stroke 04/25/2017  . Infected decubitus ulcer, unstageable ([place]) 10/26/2016  . Closed fracture of humerus, surgical neck 05/31/2016  . Closed 3-part fracture of surgical neck of right humerus with delayed healing 05/31/2016  . Pain in shoulder 05/19/2016  . Edema leg 05/18/2016  . Paraparesis ([place]) 05/18/2016  . Kidney lump 05/18/2016  . Bilateral edema of lower extremity 05/18/2016  . Bilateral leg weakness 05/18/2016  . Kidney mass 05/18/2016  . Pressure ulcer 04/24/2016  . History of DKA (diabetic ketoacidosis) ([place]) 04/23/2016    Class: History of  . Diabetes mellitus ([place]) 04/23/2016  . Other specified diabetes mellitus with ketoacidosis without coma ([place]) 04/23/2016   Janna Arch, PT, DPT   08/06/2018, 9:31 AM  [place] MAIN [facility] SERVICES [address] [place], [place], [address] Phone: [phone]   Fax:  [phone]  Name: Emoree Sasaki Faciane MRN: [id number] Date of Birth: 08/13/1961

## 2018-08-13 ENCOUNTER — Ambulatory Visit: Payer: BLUE CROSS/BLUE SHIELD

## 2018-08-13 DIAGNOSIS — M6281 Muscle weakness (generalized): Secondary | ICD-10-CM

## 2018-08-13 DIAGNOSIS — R2689 Other abnormalities of gait and mobility: Secondary | ICD-10-CM

## 2018-08-13 DIAGNOSIS — R262 Difficulty in walking, not elsewhere classified: Secondary | ICD-10-CM

## 2018-08-13 NOTE — Therapy (Signed)
[facility] MAIN REHAB SERVICES [address] [place], [place], [address] Phone: [phone]   Fax:  [phone]  Physical Therapy Treatment Physical Therapy Progress Note   Dates of reporting period  06/20/18   to   08/13/18  Patient Details  Name: [name] MRN: [id number] Date of Birth: 04/25/1961 Referring Provider: Dr. laura Katherin Bliss   Encounter Date: 08/13/2018  PT End of Session - 08/13/18 0922    Visit Number  16    Number of Visits  24    Date for PT Re-Evaluation  10/08/18    Authorization Type  9/10 PN  (next 1/10 starting 8/19)     PT Start Time  0850    PT Stop Time  0930    PT Time Calculation (min)  40 min    Equipment Utilized During Treatment  Gait belt   bilateral AFO's   Activity Tolerance  Patient tolerated treatment well;Patient limited by fatigue    Behavior During Therapy  WFL for tasks assessed/performed       Past Medical History:  Diagnosis Date  . Acute pain of right shoulder 05/19/2016  . Acute PN (pyelonephritis) 05/18/2016  . Acute pyelonephritis 05/18/2016  . Anxiety   . Arthritis   . Asthma   . Diabetes mellitus without complication (HCC)   . GERD (gastroesophageal reflux disease)   . Glaucoma   . Hyperlipemia   . Hyperlipemia   . Hypertension     Past Surgical History:  Procedure Laterality Date  . ANTERIOR CRUCIATE LIGAMENT REPAIR      There were no vitals filed for this visit.  Subjective Assessment - 08/13/18 0902    Subjective  Patient arrived late due to van picking her up late. Was hurried to get down rushing pushing herself so is fatigued upon start of session with excessive sweating. Reports compliance with HEP.     Pertinent History  Patient presented in manual wheelchair with bilateral AFOs. Was seen by this therapist prior to admittance to hospital on 03/28/18 for sepsis.  April of 2017 patient came home from work reported not feeling good, The next couple days was not feeling  good. Went to doctor who called EMS to take her to the hospital. Was unable to get a number on her sugars initially due to patient not taking her medicine due to not feeling well and not eating. Sugar was read to be in the 700's, also found multiple strains of a UTI/sepsis while there. Was in ICU for 12 days at [facility] then went to [facility]. While at [facility] was not walked or gotten up so after the fourth months there she returned home with in home therapy. Has not had any physical therapy since October. Gets around house with walker ok or in her wheelchair. Has pressure ulcer on Right buttocks with pmh including GERD, DM type II, HTN, and polyneuropathy. Patient wants to get stronger to be able to walk and stand again for more independence and to help her mother.      Limitations  Lifting;Standing;Walking;House hold activities;Other (comment)    How long can you sit comfortably?  hour    How long can you stand comfortably?  stand 5 minutes with walker     How long can you walk comfortably?  10 ft    Patient Stated Goals  walk better, want to drive, stand for longer, help her mom    Currently in Pain?  Yes    Pain   Score  7     Pain Location  Foot    Pain Orientation  Right;Left    Pain Descriptors / Indicators  Pins and needles    Pain Type  Neuropathic pain    Pain Onset  More than a month ago    Pain Frequency  Constant      5x STS: 22 seconds  Ambulate 50 ft: ambulated 130 ft with RW and CGA  BLE strength:Hip L 4-/5; R 4/5; knee 4/5 bilateral  LEFS: 39/80  10 MWT: .41 m/s  ABC= 48.8%  Seated:  Gluteal squeezes 15x  5 seconds holds TrA squeezes 15x 5 second holds  Seated marches 10x each leg 3 second holds at top  Adductor squeezes 15x 5 second holds      Patient's condition has the potential to improve in response to therapy. Maximum improvement is yet to be obtained. The anticipated improvement is attainable and reasonable in a generally predictable time. Start date of reporting  period 06/20/18 end date of reporting period 08/13/18. Patient reports her confidence is increasing and feels like she is getting stronger with some things. Still feels like left leg is a lot weaker than her right. At home without braces L foot seems to turn in                    PT Education - 08/13/18 0903    Education provided  Yes    Education Details  goal progression     Person(s) Educated  Patient    Methods  Explanation;Demonstration;Verbal cues    Comprehension  Verbalized understanding;Returned demonstration       PT Short Term Goals - 08/13/18 0903      PT SHORT TERM GOAL #1   Title  Patient will be independent in home exercise program to improve strength/mobility for better functional independence with ADLs.    Baseline  HEP compliant    Time  2    Period  Weeks    Status  Achieved      PT SHORT TERM GOAL #2   Title   Patient (< 60 years old) will complete five times sit to stand test in < 20 seconds indicating an increased LE strength and improved balance    Baseline  5/8: 39 seconds with excessive UE support. 8/19: 22 seconds     Time  2    Period  Weeks    Status  Partially Met      PT SHORT TERM GOAL #3   Title  Patient will ambulate 50 ft without rest breaks to increase functional independence.     Baseline  ambulates 30 ft  6/26: 60 ft with one rest break with RW 8/19: walk 130 ft with RW and CGA and no rest breaks    Time  2    Period  Weeks    Status  Achieved      PT SHORT TERM GOAL #4   Title  Patient will increase BLE gross strength to 4-/5 as to improve functional strength for independent gait, increased standing tolerance and increased ADL ability.    Baseline  LLE 3/5 gross : 6/26: LLE 4-/5    Time  2    Period  Weeks    Status  Achieved        PT Long Term Goals - 08/13/18 3154      PT LONG TERM GOAL #1   Title  Patient will increase BLE gross strength to 4+/5  as to improve functional strength for independent gait, increased  standing tolerance and increased ADL ability.    Baseline  3/6 and 5/8: LLE gross 3/5: 6/26: 4-/5 8/19: 4-/5 hip 4/5 knee    Time  8    Period  Weeks    Status  Partially Met    Target Date  10/08/18      PT LONG TERM GOAL #2   Title  Patient will increase lower extremity functional scale to >60/80 to demonstrate improved functional mobility and increased tolerance with ADLs.     Baseline  3/6: 24/80 5/8: 24/80 6/26: 33/80 8/18: 39/80     Time  8    Period  Weeks    Status  Partially Met    Target Date  10/08/18      PT LONG TERM GOAL #3   Title  Patient (< 3 years old) will complete five times sit to stand test in < 10 seconds indicating an increased LE strength and improved balance    Baseline  3/6: 46 seconds with UE support 5/8: 39 seconds with excessive UE support ; 6/26: 18 seconds with excessive UE support with one LOB ; 8/19: 22 seconds with decreased UE support no LOB    Time  8    Period  Weeks    Status  Partially Met    Target Date  10/08/18      PT LONG TERM GOAL #4   Title  Patient will increase 10 meter walk test to >1.15ms as to improve gait speed for better community ambulation and to reduce fall risk    Baseline  .186m 6/26: .41 m/s 8/19: .41 m/s     Time  8    Period  Weeks    Status  Partially Met    Target Date  10/08/18      PT LONG TERM GOAL #5   Title  Patient will increase ABC scale score >80% to demonstrate better functional mobility and better confidence with ADLs.     Baseline  5/8: 8.4%; 6/26: 44% 8/19: 48.8%    Time  8    Period  Weeks    Status  Partially Met    Target Date  10/08/18      Additional Long Term Goals   Additional Long Term Goals  Yes      PT LONG TERM GOAL #6   Title  Patient will ascend/descend a flight of stairs with Mod I with UE assistance to increase funcitonal mobility in family residencies.     Baseline  8/19: able to perform for 4 steps at most     Time  8    Period  Weeks    Status  New    Target Date  10/08/18             Plan - 08/13/18 0925    Clinical Impression Statement  Patient demonstrating improved duration of ambulation indicating increased capacity for functional mobility. Improved LEFS score of 39/80 and ABC scores of 48.8% indicate improved confidence in ability to perform everyday tasks. 10MWT .4113mand 5x STS =22 seconds due to patient fatigued at beginning of session. Patient's condition has the potential to improve in response to therapy. Maximum improvement is yet to be obtained. The anticipated improvement is attainable and reasonable in a generally predictable time.Patient will continue to benefit from skilled physical therapy to increase strength, mobility, and gait mechanics for increased quality of life    Rehab Potential  Fair    Clinical Impairments Affecting Rehab Potential  + good motivation, +performing HEP from previous home health -chronicity, body habitus     PT Frequency  1x / week    PT Duration  8 weeks    PT Treatment/Interventions  ADLs/Self Care Home Management;Cryotherapy;Ultrasound;Traction;Moist Heat;Electrical Stimulation;DME Instruction;Gait training;Stair training;Balance training;Therapeutic exercise;Therapeutic activities;Functional mobility training;Neuromuscular re-education;Patient/family education;Orthotic Fit/Training;Manual techniques;Wheelchair mobility training;Passive range of motion;Energy conservation;Taping    PT Next Visit Plan  ambulatory, LE strengthening.     PT Home Exercise Plan  added gluteal strenght and hamstring curls to HEP    Consulted and Agree with Plan of Care  Patient       Patient will benefit from skilled therapeutic intervention in order to improve the following deficits and impairments:  Abnormal gait, Decreased activity tolerance, Decreased balance, Decreased endurance, Decreased knowledge of precautions, Decreased mobility, Decreased strength, Difficulty walking, Impaired perceived functional ability, Impaired sensation,  Postural dysfunction, Improper body mechanics, Pain  Visit Diagnosis: Other abnormalities of gait and mobility  Difficulty in walking, not elsewhere classified  Muscle weakness (generalized)     Problem List Patient Active Problem List   Diagnosis Date Noted  . Sepsis ([place]) 04/02/2018  . 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 CKD (chronic kidney disease) stage 3, GFR 30-59 ml/min (HCC) 07/04/2017  . Class 3 drug-induced obesity with serious comorbidity and body mass index (BMI) of 45.0 to 49.9 in adult ([place]) 07/04/2017  . Chronic feet pain (Location of Primary Source of Pain) (Bilateral) (R>L) 04/25/2017  . Diabetic peripheral neuropathy (HCC) (Location of Primary Source of Pain) (Bilateral) (R>L) 04/25/2017  . Chronic lower extremity pain (Location of Secondary source of pain) (Bilateral) (R>L) 04/25/2017  . Chronic pain syndrome 04/25/2017  . Long term (current) use of opiate analgesic 04/25/2017  . Long term prescription opiate use 04/25/2017  . Opiate use 04/25/2017  . Chronic hand pain (Location of Tertiary source of pain) (Bilateral) (L>R) 04/25/2017  . Carpal tunnel syndrome (Bilateral) (L>R) 04/25/2017  . History of stroke 04/25/2017  . Infected decubitus ulcer, unstageable ([place]) 10/26/2016  . Closed fracture of humerus, surgical neck 05/31/2016  . Closed 3-part fracture of surgical neck of right humerus with delayed healing 05/31/2016  . Pain in shoulder 05/19/2016  . Edema leg 05/18/2016  . 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 Kidney lump 05/18/2016  . Bilateral edema of lower extremity 05/18/2016  . Bilateral leg weakness 05/18/2016  . Kidney mass 05/18/2016  . Pressure ulcer 04/24/2016  . History of DKA (diabetic ketoacidosis) ([place]) 04/23/2016    Class: History of  . Diabetes mellitus ([place]) 04/23/2016  . Other specified diabetes mellitus with ketoacidosis without coma ([place]) 04/23/2016   Janna Arch, PT, DPT   08/13/2018, 9:30 AM  [place] MAIN [facility] SERVICES [address] [place], [place], [address] Phone: [phone]   Fax:  [phone]  Name: Fanchon Papania Mcglinn MRN: [id number] Date of Birth: 04/07/1961

## 2018-08-21 ENCOUNTER — Ambulatory Visit: Payer: BLUE CROSS/BLUE SHIELD

## 2018-08-21 DIAGNOSIS — R262 Difficulty in walking, not elsewhere classified: Secondary | ICD-10-CM

## 2018-08-21 DIAGNOSIS — R2689 Other abnormalities of gait and mobility: Secondary | ICD-10-CM | POA: Diagnosis not present

## 2018-08-21 DIAGNOSIS — M6281 Muscle weakness (generalized): Secondary | ICD-10-CM

## 2018-08-21 NOTE — Therapy (Signed)
[place] MAIN [facility] SERVICES [address] [place], [place], [address] Phone: [phone]   Fax:  [phone]  Physical Therapy Treatment  Patient Details  Name: [name] MRN: [id number] Date of Birth: 04/19/1961 Referring Provider: Dr. Willadean Carol   Encounter Date: 08/21/2018  PT End of Session - 08/21/18 0907    Visit Number  1    Number of Visits  24    Date for PT Re-Evaluation  10/08/18    Authorization Type  1/10 PN  (starting 8/19)     PT Start Time  0850    PT Stop Time  0929    PT Time Calculation (min)  39 min    Equipment Utilized During Treatment  Gait belt   bilateral AFO's   Activity Tolerance  Patient tolerated treatment well;Patient limited by fatigue    Behavior During Therapy  [facility] for tasks assessed/performed       Past Medical History:  Diagnosis Date  . Acute pain of right shoulder 05/19/2016  . Acute PN (pyelonephritis) 05/18/2016  . Acute pyelonephritis 05/18/2016  . Anxiety   . Arthritis   . Asthma   . Diabetes mellitus without complication ([place])   . GERD (gastroesophageal reflux disease)   . Glaucoma   . Hyperlipemia   . Hyperlipemia   . Hypertension     Past Surgical History:  Procedure Laterality Date  . ANTERIOR CRUCIATE LIGAMENT REPAIR      There were no vitals filed for this visit.  Subjective Assessment - 08/21/18 0852    Subjective  Patient is fatigued today due to early wake up time. Feels a little off from waking up this morning. Left foot burning worse than right from neuropathy.     Pertinent History  Patient presented in manual wheelchair with bilateral AFOs. Was seen by this therapist prior to admittance to hospital on 03/28/18 for sepsis.  April of 2017 patient came home from work reported not feeling good, The next couple days was not feeling good. Went to doctor who called EMS to take her to the hospital. Was unable to get a number on her sugars initially due to patient not  taking her medicine due to not feeling well and not eating. Sugar was read to be in the 700's, also found multiple strains of a UTI/sepsis while there. Was in ICU for 12 days at [facility] then went to [facility]. While at [facility] was not walked or gotten up so after the fourth months there she returned home with in home therapy. Has not had any physical therapy since October. Gets around house with walker ok or in her wheelchair. Has pressure ulcer on Right buttocks with pmh including GERD, DM type II, HTN, and polyneuropathy. Patient wants to get stronger to be able to walk and stand again for more independence and to help her mother.      Limitations  Lifting;Standing;Walking;House hold activities;Other (comment)    How long can you sit comfortably?  hour    How long can you stand comfortably?  stand 5 minutes with walker     How long can you walk comfortably?  10 ft    Patient Stated Goals  walk better, want to drive, stand for longer, help her mom    Currently in Pain?  Yes    Pain Score  7     Pain Location  Foot    Pain Orientation  Right;Left    Pain Descriptors / Indicators  Burning    Pain Type  Neuropathic pain    Pain Onset  More than a month ago    Pain Frequency  Constant          Seated: Piriformis stretch LLE 60 seconds to attempt to reduce burning.               Marches BLE x25 with noted mild compensatory posterior trunk lean with raising LLE; compensation corrected by utilizing forward trunk lean to increase core activation. ;                Seated LAQ 15x 5 second holds, BLE ;                TrA seated activation 15x 5 second holds                Gluteal squeezes 15x  5 second holds ; verbal cueing for equal activation.                Hip abduction RTB 15x  each leg, one leg at a time, BLE (hold opp leg stable) ; tactile cueing for stabilization of opp LE                Hip adduction squeezes ball 15x 5 seconds . ; cues for prolonged hold       Standing           Hip abduction 10x each leg BUE support : terminated due discomfort in R hip.        Airex pad: static balance 3x30 seconds; occasional posterior LOB; last trial caused low back spasm.    Ambulate 96 ft with WC and CGA in gym Cues for widening BOS and hip flexion  Pt requires long rest breaks between sets and between exercises due to quickness to fatigue and overheating; seated ice pack use.   Verbal cueing required for all standing interventions for abdominal activation to reduce low back pain.    Pt. response to medical necessity:  Patient will continue to benefit from skilled physical therapy to Increase strength, mobility, and gait mechanics for increased quality of life                       PT Education - 08/21/18 0906    Education provided  Yes    Education Details  ambulation, exercise technique     Person(s) Educated  Patient    Methods  Explanation;Demonstration;Verbal cues    Comprehension  Verbalized understanding;Returned demonstration       PT Short Term Goals - 08/13/18 0903      PT SHORT TERM GOAL #1   Title  Patient will be independent in home exercise program to improve strength/mobility for better functional independence with ADLs.    Baseline  HEP compliant    Time  2    Period  Weeks    Status  Achieved      PT SHORT TERM GOAL #2   Title   Patient (< 69 years old) will complete five times sit to stand test in < 20 seconds indicating an increased LE strength and improved balance    Baseline  5/8: 39 seconds with excessive UE support. 8/19: 22 seconds     Time  2    Period  Weeks    Status  Partially Met      PT SHORT TERM GOAL #3   Title  Patient will ambulate 50 ft without rest breaks  to increase functional independence.     Baseline  ambulates 30 ft  6/26: 60 ft with one rest break with RW 8/19: walk 130 ft with RW and CGA and no rest breaks    Time  2    Period  Weeks    Status  Achieved      PT SHORT TERM GOAL #4   Title   Patient will increase BLE gross strength to 4-/5 as to improve functional strength for independent gait, increased standing tolerance and increased ADL ability.    Baseline  LLE 3/5 gross : 6/26: LLE 4-/5    Time  2    Period  Weeks    Status  Achieved        PT Long Term Goals - 08/13/18 2197      PT LONG TERM GOAL #1   Title  Patient will increase BLE gross strength to 4+/5 as to improve functional strength for independent gait, increased standing tolerance and increased ADL ability.    Baseline  3/6 and 5/8: LLE gross 3/5: 6/26: 4-/5 8/19: 4-/5 hip 4/5 knee    Time  8    Period  Weeks    Status  Partially Met    Target Date  10/08/18      PT LONG TERM GOAL #2   Title  Patient will increase lower extremity functional scale to >60/80 to demonstrate improved functional mobility and increased tolerance with ADLs.     Baseline  3/6: 24/80 5/8: 24/80 6/26: 33/80 8/18: 39/80     Time  8    Period  Weeks    Status  Partially Met    Target Date  10/08/18      PT LONG TERM GOAL #3   Title  Patient (< 42 years old) will complete five times sit to stand test in < 10 seconds indicating an increased LE strength and improved balance    Baseline  3/6: 46 seconds with UE support 5/8: 39 seconds with excessive UE support ; 6/26: 18 seconds with excessive UE support with one LOB ; 8/19: 22 seconds with decreased UE support no LOB    Time  8    Period  Weeks    Status  Partially Met    Target Date  10/08/18      PT LONG TERM GOAL #4   Title  Patient will increase 10 meter walk test to >1.65ms as to improve gait speed for better community ambulation and to reduce fall risk    Baseline  .115m 6/26: .41 m/s 8/19: .41 m/s     Time  8    Period  Weeks    Status  Partially Met    Target Date  10/08/18      PT LONG TERM GOAL #5   Title  Patient will increase ABC scale score >80% to demonstrate better functional mobility and better confidence with ADLs.     Baseline  5/8: 8.4%; 6/26: 44% 8/19:  48.8%    Time  8    Period  Weeks    Status  Partially Met    Target Date  10/08/18      Additional Long Term Goals   Additional Long Term Goals  Yes      PT LONG TERM GOAL #6   Title  Patient will ascend/descend a flight of stairs with Mod I with UE assistance to increase funcitonal mobility in family residencies.     Baseline  8/19: able to  perform for 4 steps at most     Time  8    Period  Weeks    Status  New    Target Date  10/08/18            Plan - 08/21/18 0906    Clinical Impression Statement  Patient ambulates with good upright posture however requires multiple cues for widening BOS, able to negotiate turns without LOB and good control with RW. Patient fatigues quickly after ambulation requiring seated interventions to allow for rest to be able to return to standing challenging interventions.  Patient will continue to benefit from skilled physical therapy to Increase strength, mobility, and gait mechanics for increased quality of life    Rehab Potential  Fair    Clinical Impairments Affecting Rehab Potential  + good motivation, +performing HEP from previous home health -chronicity, body habitus     PT Frequency  1x / week    PT Duration  8 weeks    PT Treatment/Interventions  ADLs/Self Care Home Management;Cryotherapy;Ultrasound;Traction;Moist Heat;Electrical Stimulation;DME Instruction;Gait training;Stair training;Balance training;Therapeutic exercise;Therapeutic activities;Functional mobility training;Neuromuscular re-education;Patient/family education;Orthotic Fit/Training;Manual techniques;Wheelchair mobility training;Passive range of motion;Energy conservation;Taping    PT Next Visit Plan  ambulatory, LE strengthening.     PT Home Exercise Plan  added gluteal strenght and hamstring curls to HEP    Consulted and Agree with Plan of Care  Patient       Patient will benefit from skilled therapeutic intervention in order to improve the following deficits and  impairments:  Abnormal gait, Decreased activity tolerance, Decreased balance, Decreased endurance, Decreased knowledge of precautions, Decreased mobility, Decreased strength, Difficulty walking, Impaired perceived functional ability, Impaired sensation, Postural dysfunction, Improper body mechanics, Pain  Visit Diagnosis: Other abnormalities of gait and mobility  Difficulty in walking, not elsewhere classified  Muscle weakness (generalized)     Problem List Patient Active Problem List   Diagnosis Date Noted  . Sepsis ([place]) 04/02/2018  . 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 CKD (chronic kidney disease) stage 3, GFR 30-59 ml/min (HCC) 07/04/2017  . Class 3 drug-induced obesity with serious comorbidity and body mass index (BMI) of 45.0 to 49.9 in adult ([place]) 07/04/2017  . Chronic feet pain (Location of Primary Source of Pain) (Bilateral) (R>L) 04/25/2017  . Diabetic peripheral neuropathy (HCC) (Location of Primary Source of Pain) (Bilateral) (R>L) 04/25/2017  . Chronic lower extremity pain (Location of Secondary source of pain) (Bilateral) (R>L) 04/25/2017  . Chronic pain syndrome 04/25/2017  . Long term (current) use of opiate analgesic 04/25/2017  . Long term prescription opiate use 04/25/2017  . Opiate use 04/25/2017  . Chronic hand pain (Location of Tertiary source of pain) (Bilateral) (L>R) 04/25/2017  . Carpal tunnel syndrome (Bilateral) (L>R) 04/25/2017  . History of stroke 04/25/2017  . Infected decubitus ulcer, unstageable ([place]) 10/26/2016  . Closed fracture of humerus, surgical neck 05/31/2016  . Closed 3-part fracture of surgical neck of right humerus with delayed healing 05/31/2016  . Pain in shoulder 05/19/2016  . Edema leg 05/18/2016  . 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 Kidney lump 05/18/2016  . Bilateral edema of lower extremity 05/18/2016  . Bilateral leg weakness 05/18/2016  . Kidney mass 05/18/2016  . Pressure ulcer 04/24/2016  . History of DKA  (diabetic ketoacidosis) ([place]) 04/23/2016    Class: History of  . Diabetes mellitus ([place]) 04/23/2016  . Other specified diabetes mellitus with ketoacidosis without coma ([place]) 04/23/2016  Janna Arch, PT, DPT   08/21/2018, 9:29 AM  [place]  [place], [place], [address] Phone: [phone]   Fax:  [phone]  Name: [name] MRN: [id number] Date of Birth: [birth year]

## 2018-08-28 ENCOUNTER — Ambulatory Visit: Payer: BLUE CROSS/BLUE SHIELD | Attending: Family Medicine

## 2018-08-28 DIAGNOSIS — M6281 Muscle weakness (generalized): Secondary | ICD-10-CM | POA: Insufficient documentation

## 2018-08-28 DIAGNOSIS — R262 Difficulty in walking, not elsewhere classified: Secondary | ICD-10-CM | POA: Diagnosis present

## 2018-08-28 DIAGNOSIS — R2689 Other abnormalities of gait and mobility: Secondary | ICD-10-CM | POA: Diagnosis not present

## 2018-08-28 NOTE — Therapy (Signed)
[place] MAIN [facility] SERVICES [place], [place], [address] Phone: [phone]   Fax:  [phone]  Physical Therapy Treatment  Patient Details  Name: [name] MRN: [id number] Date of Birth: 27-Oct-1961 Referring Provider: Dr. Willadean Carol   Encounter Date: 08/28/2018  PT End of Session - 08/28/18 0917    Visit Number  18    Number of Visits  24    Date for PT Re-Evaluation  10/08/18    Authorization Type  2/10 PN  (starting 8/19)     PT Start Time  0900    PT Stop Time  0944    PT Time Calculation (min)  44 min    Equipment Utilized During Treatment  Gait belt   bilateral AFO's   Activity Tolerance  Patient tolerated treatment well;Patient limited by fatigue    Behavior During Therapy  [facility] for tasks assessed/performed       Past Medical History:  Diagnosis Date  . Acute pain of right shoulder 05/19/2016  . Acute PN (pyelonephritis) 05/18/2016  . Acute pyelonephritis 05/18/2016  . Anxiety   . Arthritis   . Asthma   . Diabetes mellitus without complication ([place])   . GERD (gastroesophageal reflux disease)   . Glaucoma   . Hyperlipemia   . Hyperlipemia   . Hypertension     Past Surgical History:  Procedure Laterality Date  . ANTERIOR CRUCIATE LIGAMENT REPAIR      There were no vitals filed for this visit.  Subjective Assessment - 08/28/18 0904    Subjective  Patient reports feet pain is 8/10, woke up patient last night in the night. Has been doing her HEP.     Pertinent History  Patient presented in manual wheelchair with bilateral AFOs. Was seen by this therapist prior to admittance to hospital on 03/28/18 for sepsis.  April of 2017 patient came home from work reported not feeling good, The next couple days was not feeling good. Went to doctor who called EMS to take her to the hospital. Was unable to get a number on her sugars initially due to patient not taking her medicine due to not feeling well and not  eating. Sugar was read to be in the 700's, also found multiple strains of a UTI/sepsis while there. Was in ICU for 12 days at [facility] then went to [facility]. While at [facility] was not walked or gotten up so after the fourth months there she returned home with in home therapy. Has not had any physical therapy since October. Gets around house with walker ok or in her wheelchair. Has pressure ulcer on Right buttocks with pmh including GERD, DM type II, HTN, and polyneuropathy. Patient wants to get stronger to be able to walk and stand again for more independence and to help her mother.      Limitations  Lifting;Standing;Walking;House hold activities;Other (comment)    How long can you sit comfortably?  hour    How long can you stand comfortably?  stand 5 minutes with walker     How long can you walk comfortably?  10 ft    Patient Stated Goals  walk better, want to drive, stand for longer, help her mom    Currently in Pain?  Yes    Pain Score  8     Pain Location  Foot    Pain Orientation  Right;Left    Pain Descriptors / Indicators  Burning    Pain Type  Neuropathic pain    Pain Onset  More than a month ago    Pain Frequency  Constant        Seated: Piriformis stretch LLE 60 seconds to attempt to reduce burning.                Marches BLE x25 with noted mild compensatory posterior trunk lean with raising LLE; compensation corrected by utilizing forward trunk lean to increase core activation. ;                Seated LAQ 15x 5 second holds, BLE ;                TrA seated activation 15x 5 second holds                Gluteal squeezes 15x  5 second holds ; verbal cueing for equal activation.                Hip abduction RTB 15x  each leg, one leg at a time, BLE (hold opp leg stable) ; tactile cueing for stabilization of opp LE                Hip adduction squeezes ball 15x 5 seconds . ; cues for prolonged hold      RTB hip flexion 10x each leg , one leg at a time   Standing          Hip  abduction 10x each leg BUE support : terminated due discomfort in R hip.     Balloon taps 3 minutes reaching inside and outside BOS with SUE support    Ambulate 170 ft with RW  WC and CGA in gym Cues for widening BOS and hip flexion   Pt requires long rest breaks between sets and between exercises due to quickness to fatigue and overheating; seated ice pack use.    Verbal cueing required for all standing interventions for abdominal activation to reduce low back pain.    Pt. response to medical necessity:  Patient will continue to benefit from skilled physical therapy to Increase strength, mobility, and gait mechanics for increased quality of life                        PT Education - 08/28/18 0916    Education provided  Yes    Education Details  ambulation, exercise technique     Person(s) Educated  Patient    Methods  Explanation;Demonstration;Verbal cues    Comprehension  Verbalized understanding;Returned demonstration       PT Short Term Goals - 08/13/18 0903      PT SHORT TERM GOAL #1   Title  Patient will be independent in home exercise program to improve strength/mobility for better functional independence with ADLs.    Baseline  HEP compliant    Time  2    Period  Weeks    Status  Achieved      PT SHORT TERM GOAL #2   Title   Patient (< 40 years old) will complete five times sit to stand test in < 20 seconds indicating an increased LE strength and improved balance    Baseline  5/8: 39 seconds with excessive UE support. 8/19: 22 seconds     Time  2    Period  Weeks    Status  Partially Met      PT SHORT TERM GOAL #3   Title  Patient will ambulate 50  ft without rest breaks to increase functional independence.     Baseline  ambulates 30 ft  6/26: 60 ft with one rest break with RW 8/19: walk 130 ft with RW and CGA and no rest breaks    Time  2    Period  Weeks    Status  Achieved      PT SHORT TERM GOAL #4   Title  Patient will increase BLE gross  strength to 4-/5 as to improve functional strength for independent gait, increased standing tolerance and increased ADL ability.    Baseline  LLE 3/5 gross : 6/26: LLE 4-/5    Time  2    Period  Weeks    Status  Achieved        PT Long Term Goals - 08/13/18 0623      PT LONG TERM GOAL #1   Title  Patient will increase BLE gross strength to 4+/5 as to improve functional strength for independent gait, increased standing tolerance and increased ADL ability.    Baseline  3/6 and 5/8: LLE gross 3/5: 6/26: 4-/5 8/19: 4-/5 hip 4/5 knee    Time  8    Period  Weeks    Status  Partially Met    Target Date  10/08/18      PT LONG TERM GOAL #2   Title  Patient will increase lower extremity functional scale to >60/80 to demonstrate improved functional mobility and increased tolerance with ADLs.     Baseline  3/6: 24/80 5/8: 24/80 6/26: 33/80 8/18: 39/80     Time  8    Period  Weeks    Status  Partially Met    Target Date  10/08/18      PT LONG TERM GOAL #3   Title  Patient (< 59 years old) will complete five times sit to stand test in < 10 seconds indicating an increased LE strength and improved balance    Baseline  3/6: 46 seconds with UE support 5/8: 39 seconds with excessive UE support ; 6/26: 18 seconds with excessive UE support with one LOB ; 8/19: 22 seconds with decreased UE support no LOB    Time  8    Period  Weeks    Status  Partially Met    Target Date  10/08/18      PT LONG TERM GOAL #4   Title  Patient will increase 10 meter walk test to >1.34ms as to improve gait speed for better community ambulation and to reduce fall risk    Baseline  .176m 6/26: .41 m/s 8/19: .41 m/s     Time  8    Period  Weeks    Status  Partially Met    Target Date  10/08/18      PT LONG TERM GOAL #5   Title  Patient will increase ABC scale score >80% to demonstrate better functional mobility and better confidence with ADLs.     Baseline  5/8: 8.4%; 6/26: 44% 8/19: 48.8%    Time  8    Period   Weeks    Status  Partially Met    Target Date  10/08/18      Additional Long Term Goals   Additional Long Term Goals  Yes      PT LONG TERM GOAL #6   Title  Patient will ascend/descend a flight of stairs with Mod I with UE assistance to increase funcitonal mobility in family residencies.     Baseline  8/19: able to perform for 4 steps at most     Time  8    Period  Weeks    Status  New    Target Date  10/08/18            Plan - 08/28/18 8341    Clinical Impression Statement  Patient demonstrating improved capacity for functional mobility with improved duration of ambulation without rest breaks. Patient fatigues quicker after ambulation today due to exerting self during walk.  Patient will continue to benefit from skilled physical therapy to Increase strength, mobility, and gait mechanics for increased quality of life    Rehab Potential  Fair    Clinical Impairments Affecting Rehab Potential  + good motivation, +performing HEP from previous home health -chronicity, body habitus     PT Frequency  1x / week    PT Duration  8 weeks    PT Treatment/Interventions  ADLs/Self Care Home Management;Cryotherapy;Ultrasound;Traction;Moist Heat;Electrical Stimulation;DME Instruction;Gait training;Stair training;Balance training;Therapeutic exercise;Therapeutic activities;Functional mobility training;Neuromuscular re-education;Patient/family education;Orthotic Fit/Training;Manual techniques;Wheelchair mobility training;Passive range of motion;Energy conservation;Taping    PT Next Visit Plan  ambulatory, LE strengthening.     PT Home Exercise Plan  added gluteal strenght and hamstring curls to HEP    Consulted and Agree with Plan of Care  Patient       Patient will benefit from skilled therapeutic intervention in order to improve the following deficits and impairments:  Abnormal gait, Decreased activity tolerance, Decreased balance, Decreased endurance, Decreased knowledge of precautions,  Decreased mobility, Decreased strength, Difficulty walking, Impaired perceived functional ability, Impaired sensation, Postural dysfunction, Improper body mechanics, Pain  Visit Diagnosis: Other abnormalities of gait and mobility  Difficulty in walking, not elsewhere classified  Muscle weakness (generalized)     Problem List Patient Active Problem List   Diagnosis Date Noted  . Sepsis ([place]) 04/02/2018  . 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 CKD (chronic kidney disease) stage 3, GFR 30-59 ml/min (HCC) 07/04/2017  . Class 3 drug-induced obesity with serious comorbidity and body mass index (BMI) of 45.0 to 49.9 in adult ([place]) 07/04/2017  . Chronic feet pain (Location of Primary Source of Pain) (Bilateral) (R>L) 04/25/2017  . Diabetic peripheral neuropathy (HCC) (Location of Primary Source of Pain) (Bilateral) (R>L) 04/25/2017  . Chronic lower extremity pain (Location of Secondary source of pain) (Bilateral) (R>L) 04/25/2017  . Chronic pain syndrome 04/25/2017  . Long term (current) use of opiate analgesic 04/25/2017  . Long term prescription opiate use 04/25/2017  . Opiate use 04/25/2017  . Chronic hand pain (Location of Tertiary source of pain) (Bilateral) (L>R) 04/25/2017  . Carpal tunnel syndrome (Bilateral) (L>R) 04/25/2017  . History of stroke 04/25/2017  . Infected decubitus ulcer, unstageable ([place]) 10/26/2016  . Closed fracture of humerus, surgical neck 05/31/2016  . Closed 3-part fracture of surgical neck of right humerus with delayed healing 05/31/2016  . Pain in shoulder 05/19/2016  . Edema leg 05/18/2016  . 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 Kidney lump 05/18/2016  . Bilateral edema of lower extremity 05/18/2016  . Bilateral leg weakness 05/18/2016  . Kidney mass 05/18/2016  . Pressure ulcer 04/24/2016  . History of DKA (diabetic ketoacidosis) ([place]) 04/23/2016    Class: History of  . Diabetes mellitus ([place]) 04/23/2016  . Other specified diabetes mellitus  with ketoacidosis without coma ([place]) 04/23/2016   Janna Arch, PT, DPT   08/28/2018, 9:45 AM  [facility] MAIN [facility] SERVICES [address] [place], [place], [address] Phone: [phone]  Fax:  [phone]  Name: [name] MRN: [id number] Date of Birth: [birth year]

## 2018-09-04 ENCOUNTER — Ambulatory Visit: Payer: BLUE CROSS/BLUE SHIELD

## 2018-09-04 DIAGNOSIS — R262 Difficulty in walking, not elsewhere classified: Secondary | ICD-10-CM

## 2018-09-04 DIAGNOSIS — R2689 Other abnormalities of gait and mobility: Secondary | ICD-10-CM | POA: Diagnosis not present

## 2018-09-04 DIAGNOSIS — M6281 Muscle weakness (generalized): Secondary | ICD-10-CM

## 2018-09-04 NOTE — Therapy (Signed)
[place] MAIN [facility] SERVICES [address] [place], [place], [address] Phone: [phone]   Fax:  [phone]  Physical Therapy Treatment  Patient Details  Name: [name] MRN: [id number] Date of Birth: 1961/09/24 Referring Provider: Dr. Willadean Carol   Encounter Date: 09/04/2018  PT End of Session - 09/04/18 0954    Visit Number  19    Number of Visits  24    Date for PT Re-Evaluation  10/08/18    Authorization Type  3/10 PN  (starting 8/19)     PT Start Time  0857    PT Stop Time  0944    PT Time Calculation (min)  47 min    Equipment Utilized During Treatment  Gait belt   bilateral AFO's   Activity Tolerance  Patient tolerated treatment well;Patient limited by fatigue    Behavior During Therapy  [facility] for tasks assessed/performed       Past Medical History:  Diagnosis Date  . Acute pain of right shoulder 05/19/2016  . Acute PN (pyelonephritis) 05/18/2016  . Acute pyelonephritis 05/18/2016  . Anxiety   . Arthritis   . Asthma   . Diabetes mellitus without complication ([place])   . GERD (gastroesophageal reflux disease)   . Glaucoma   . Hyperlipemia   . Hyperlipemia   . Hypertension     Past Surgical History:  Procedure Laterality Date  . ANTERIOR CRUCIATE LIGAMENT REPAIR      There were no vitals filed for this visit.  Subjective Assessment - 09/04/18 0951    Subjective  Patient woke up this morning with 8/10 pain. Has been up since 3am. Reports she has been increasing her AFO wear for walking.     Pertinent History  Patient presented in manual wheelchair with bilateral AFOs. Was seen by this therapist prior to admittance to hospital on 03/28/18 for sepsis.  April of 2017 patient came home from work reported not feeling good, The next couple days was not feeling good. Went to doctor who called EMS to take her to the hospital. Was unable to get a number on her sugars initially due to patient not taking her medicine due to  not feeling well and not eating. Sugar was read to be in the 700's, also found multiple strains of a UTI/sepsis while there. Was in ICU for 12 days at [facility] then went to [facility]. While at [facility] was not walked or gotten up so after the fourth months there she returned home with in home therapy. Has not had any physical therapy since October. Gets around house with walker ok or in her wheelchair. Has pressure ulcer on Right buttocks with pmh including GERD, DM type II, HTN, and polyneuropathy. Patient wants to get stronger to be able to walk and stand again for more independence and to help her mother.      Limitations  Lifting;Standing;Walking;House hold activities;Other (comment)    How long can you sit comfortably?  hour    How long can you stand comfortably?  stand 5 minutes with walker     How long can you walk comfortably?  10 ft    Patient Stated Goals  walk better, want to drive, stand for longer, help her mom    Currently in Pain?  Yes    Pain Score  8     Pain Location  Foot    Pain Orientation  Right;Left    Pain Descriptors / Indicators  Tingling  Pain Onset  More than a month ago    Pain Frequency  Constant          Seated adduction 2 x 10 5 second holds  Seated marches 2x10 each leg. Verbal cueing to maintain LE alignment  Standing reaches with small balls inside and outside BOS with 1 UE support with occasional decrease of UE support 39mnutes   Standing step overs with orange hurdle. BUE support x 10 each leg with standing rest break in middle  Stairs with bilateral UE support ascending and descending 4 steps x 2 CGA. Verbal cueing to increase BOS and to ascend with strong leg and descend with weaker leg.   TrA 2 x 10 3-5 seconds holds  Gluteal squeezes x10 5 second holds  LAQ x 10 each leg 5 second hold  Seated hamstring curl GTB x 15 each leg  Pt. response to medical necessity: Patient will continue to benefit from skilled physical therapy to increase  strength, mobility, and gait mechanics for increased quality of life                       PT Education - 09/04/18 0953    Education provided  Yes    Education Details  AFO wear, exercise technique, stability     Person(s) Educated  Patient    Methods  Explanation;Demonstration;Verbal cues    Comprehension  Verbalized understanding;Returned demonstration       PT Short Term Goals - 08/13/18 0903      PT SHORT TERM GOAL #1   Title  Patient will be independent in home exercise program to improve strength/mobility for better functional independence with ADLs.    Baseline  HEP compliant    Time  2    Period  Weeks    Status  Achieved      PT SHORT TERM GOAL #2   Title   Patient (< 754years old) will complete five times sit to stand test in < 20 seconds indicating an increased LE strength and improved balance    Baseline  5/8: 39 seconds with excessive UE support. 8/19: 22 seconds     Time  2    Period  Weeks    Status  Partially Met      PT SHORT TERM GOAL #3   Title  Patient will ambulate 50 ft without rest breaks to increase functional independence.     Baseline  ambulates 30 ft  6/26: 60 ft with one rest break with RW 8/19: walk 130 ft with RW and CGA and no rest breaks    Time  2    Period  Weeks    Status  Achieved      PT SHORT TERM GOAL #4   Title  Patient will increase BLE gross strength to 4-/5 as to improve functional strength for independent gait, increased standing tolerance and increased ADL ability.    Baseline  LLE 3/5 gross : 6/26: LLE 4-/5    Time  2    Period  Weeks    Status  Achieved        PT Long Term Goals - 08/13/18 02500     PT LONG TERM GOAL #1   Title  Patient will increase BLE gross strength to 4+/5 as to improve functional strength for independent gait, increased standing tolerance and increased ADL ability.    Baseline  3/6 and 5/8: LLE gross 3/5: 6/26: 4-/5 8/19: 4-/5 hip 4/5 knee  Time  8    Period  Weeks    Status   Partially Met    Target Date  10/08/18      PT LONG TERM GOAL #2   Title  Patient will increase lower extremity functional scale to >60/80 to demonstrate improved functional mobility and increased tolerance with ADLs.     Baseline  3/6: 24/80 5/8: 24/80 6/26: 33/80 8/18: 39/80     Time  8    Period  Weeks    Status  Partially Met    Target Date  10/08/18      PT LONG TERM GOAL #3   Title  Patient (< 58 years old) will complete five times sit to stand test in < 10 seconds indicating an increased LE strength and improved balance    Baseline  3/6: 46 seconds with UE support 5/8: 39 seconds with excessive UE support ; 6/26: 18 seconds with excessive UE support with one LOB ; 8/19: 22 seconds with decreased UE support no LOB    Time  8    Period  Weeks    Status  Partially Met    Target Date  10/08/18      PT LONG TERM GOAL #4   Title  Patient will increase 10 meter walk test to >1.15ms as to improve gait speed for better community ambulation and to reduce fall risk    Baseline  .189m 6/26: .41 m/s 8/19: .41 m/s     Time  8    Period  Weeks    Status  Partially Met    Target Date  10/08/18      PT LONG TERM GOAL #5   Title  Patient will increase ABC scale score >80% to demonstrate better functional mobility and better confidence with ADLs.     Baseline  5/8: 8.4%; 6/26: 44% 8/19: 48.8%    Time  8    Period  Weeks    Status  Partially Met    Target Date  10/08/18      Additional Long Term Goals   Additional Long Term Goals  Yes      PT LONG TERM GOAL #6   Title  Patient will ascend/descend a flight of stairs with Mod I with UE assistance to increase funcitonal mobility in family residencies.     Baseline  8/19: able to perform for 4 steps at most     Time  8    Period  Weeks    Status  New    Target Date  10/08/18            Plan - 09/04/18 1004    Clinical Impression Statement  Patient continues to be challenged with prolong standing tasks with increased shortness of  breath and fatigue noticed with increased duration. Progression with standing interventions with completion of standing step over tasks with hurdle.  Patient will continue to benefit from skilled physical therapy to increase strength, mobility, and gait mechanics for increased quality of life     Rehab Potential  Fair    Clinical Impairments Affecting Rehab Potential  + good motivation, +performing HEP from previous home health -chronicity, body habitus     PT Frequency  1x / week    PT Duration  8 weeks    PT Treatment/Interventions  ADLs/Self Care Home Management;Cryotherapy;Ultrasound;Traction;Moist Heat;Electrical Stimulation;DME Instruction;Gait training;Stair training;Balance training;Therapeutic exercise;Therapeutic activities;Functional mobility training;Neuromuscular re-education;Patient/family education;Orthotic Fit/Training;Manual techniques;Wheelchair mobility training;Passive range of motion;Energy conservation;Taping    PT Next Visit  Plan  ambulatory, LE strengthening.     PT Home Exercise Plan  added gluteal strenght and hamstring curls to HEP    Consulted and Agree with Plan of Care  Patient       Patient will benefit from skilled therapeutic intervention in order to improve the following deficits and impairments:  Abnormal gait, Decreased activity tolerance, Decreased balance, Decreased endurance, Decreased knowledge of precautions, Decreased mobility, Decreased strength, Difficulty walking, Impaired perceived functional ability, Impaired sensation, Postural dysfunction, Improper body mechanics, Pain  Visit Diagnosis: Other abnormalities of gait and mobility  Difficulty in walking, not elsewhere classified  Muscle weakness (generalized)     Problem List Patient Active Problem List   Diagnosis Date Noted  . Sepsis ([place]) 04/02/2018  . 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 CKD (chronic kidney disease) stage 3, GFR 30-59 ml/min (HCC) 07/04/2017  . Class 3  drug-induced obesity with serious comorbidity and body mass index (BMI) of 45.0 to 49.9 in adult ([place]) 07/04/2017  . Chronic feet pain (Location of Primary Source of Pain) (Bilateral) (R>L) 04/25/2017  . Diabetic peripheral neuropathy (HCC) (Location of Primary Source of Pain) (Bilateral) (R>L) 04/25/2017  . Chronic lower extremity pain (Location of Secondary source of pain) (Bilateral) (R>L) 04/25/2017  . Chronic pain syndrome 04/25/2017  . Long term (current) use of opiate analgesic 04/25/2017  . Long term prescription opiate use 04/25/2017  . Opiate use 04/25/2017  . Chronic hand pain (Location of Tertiary source of pain) (Bilateral) (L>R) 04/25/2017  . Carpal tunnel syndrome (Bilateral) (L>R) 04/25/2017  . History of stroke 04/25/2017  . Infected decubitus ulcer, unstageable ([place]) 10/26/2016  . Closed fracture of humerus, surgical neck 05/31/2016  . Closed 3-part fracture of surgical neck of right humerus with delayed healing 05/31/2016  . Pain in shoulder 05/19/2016  . Edema leg 05/18/2016  . 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 Kidney lump 05/18/2016  . Bilateral edema of lower extremity 05/18/2016  . Bilateral leg weakness 05/18/2016  . Kidney mass 05/18/2016  . Pressure ulcer 04/24/2016  . History of DKA (diabetic ketoacidosis) ([place]) 04/23/2016    Class: History of  . Diabetes mellitus ([place]) 04/23/2016  . Other specified diabetes mellitus with ketoacidosis without coma ([place]) 04/23/2016   Erick Blinks, SPT  This entire session was performed under direct supervision and direction of a licensed therapist/therapist assistant . I have personally read, edited and approve of the note as written.  Janna Arch, PT, DPT   09/04/2018, 10:08 AM  [place] MAIN [facility] SERVICES [address] [place], [place], [address] Phone: [phone]   Fax:  [phone]  Name: [name] MRN: [id number] Date of Birth: 1961-02-15

## 2018-09-11 ENCOUNTER — Ambulatory Visit: Payer: BLUE CROSS/BLUE SHIELD

## 2018-09-11 DIAGNOSIS — M6281 Muscle weakness (generalized): Secondary | ICD-10-CM

## 2018-09-11 DIAGNOSIS — R262 Difficulty in walking, not elsewhere classified: Secondary | ICD-10-CM

## 2018-09-11 DIAGNOSIS — R2689 Other abnormalities of gait and mobility: Secondary | ICD-10-CM

## 2018-09-11 NOTE — Therapy (Addendum)
[name] [facility] SERVICES [address] [place], [place], [address] Phone: [phone]   Fax:  [phone]  Physical Therapy Treatment  Patient Details  Name: [name] MRN: [id number] Date of Birth: April 19, 1961 Referring Provider: Dr. Willadean Carol   Encounter Date: 09/11/2018  PT End of Session - 09/11/18 1007    Visit Number  20    Number of Visits  24    Date for PT Re-Evaluation  10/08/18    Authorization Type  4/10 PN  (starting 8/19)     PT Start Time  0902    PT Stop Time  0946    PT Time Calculation (min)  44 min    Equipment Utilized During Treatment  Gait belt    Activity Tolerance  Patient limited by pain;Patient limited by fatigue    Behavior During Therapy  [facility] for tasks assessed/performed       Past Medical History:  Diagnosis Date  . Acute pain of right shoulder 05/19/2016  . Acute PN (pyelonephritis) 05/18/2016  . Acute pyelonephritis 05/18/2016  . Anxiety   . Arthritis   . Asthma   . Diabetes mellitus without complication ([place])   . GERD (gastroesophageal reflux disease)   . Glaucoma   . Hyperlipemia   . Hyperlipemia   . Hypertension     Past Surgical History:  Procedure Laterality Date  . ANTERIOR CRUCIATE LIGAMENT REPAIR      There were no vitals filed for this visit.  Subjective Assessment - 09/11/18 0940    Subjective  Patient reports having an "electric shock" pain in LLE that began on Saturday. States it will last 2-3 hours. States she had the same pain again yesterday and she does not know what causes it. States that the pain is only on the outside of her leg. Reports she has a doctors appointment on Monday.     Pertinent History  Patient presented in manual wheelchair with bilateral AFOs. Was seen by this therapist prior to admittance to hospital on 03/28/18 for sepsis.  April of 2017 patient came home from work reported not feeling good, The next couple days was not feeling good. Went to  doctor who called EMS to take her to the hospital. Was unable to get a number on her sugars initially due to patient not taking her medicine due to not feeling well and not eating. Sugar was read to be in the 700's, also found multiple strains of a UTI/sepsis while there. Was in ICU for 12 days at [facility] then went to [facility]. While at [facility] was not walked or gotten up so after the fourth months there she returned home with in home therapy. Has not had any physical therapy since October. Gets around house with walker ok or in her wheelchair. Has pressure ulcer on Right buttocks with pmh including GERD, DM type II, HTN, and polyneuropathy. Patient wants to get stronger to be able to walk and stand again for more independence and to help her mother.      Limitations  Lifting;Standing;Walking;House hold activities;Other (comment)    How long can you sit comfortably?  hour    How long can you stand comfortably?  stand 5 minutes with walker     How long can you walk comfortably?  10 ft    Patient Stated Goals  walk better, want to drive, stand for longer, help her mom    Currently in Pain?  Yes    Pain  Score  9     Pain Orientation  Left;Right    Pain Descriptors / Indicators  Tingling    Pain Type  Neuropathic pain    Pain Onset  More than a month ago    Pain Frequency  Constant          Ambulate 170 ft with RW  WC follow and CGA in hall. Cues for widening BOS and hip flexion. Seated rest break at 48f. Reports shooting pain decreased with ambulation  LAQ x 10 each leg 5 second hold  Seated marches 2x10 each leg. Verbal cueing to maintain LE alignment    Standing step overs with orange hurdle in //bars CGA. Terminated due to onset of sharp LLE pain.    LAQ with cervical flexion/extension (seated nerve glides) for nerve mobility and pain relief. 3 minutes  Seated rocker board without AFO Min A for df- for pain relief and decrease neural tension 551mutes  Alternating LAQ with each leg with  rocker board for pain relief and decreased neural tension in LLE [id number]    Pt requires long rest breaks between sets and between exercises due to quickness to fatigue and overheating; seated ice pack use.    Verbal cueing required for all standing interventions for abdominal activation to reduce low back pain.                          PT Education - 09/11/18 [id number] Education provided  Yes    Education Details  AFO wear, exercise technique, HEP, importance of water, talk with doctor about current symptoms    Person(s) Educated  Patient    Methods  Explanation;Demonstration;Verbal cues    Comprehension  Verbalized understanding;Returned demonstration       PT Short Term Goals - 08/13/18 0903      PT SHORT TERM GOAL #1   Title  Patient will be independent in home exercise program to improve strength/mobility for better functional independence with ADLs.    Baseline  HEP compliant    Time  2    Period  Weeks    Status  Achieved      PT SHORT TERM GOAL #2   Title   Patient (< 60 86ars old) will complete five times sit to stand test in < 20 seconds indicating an increased LE strength and improved balance    Baseline  5/8: 39 seconds with excessive UE support. 8/19: 22 seconds     Time  2    Period  Weeks    Status  Partially Met      PT SHORT TERM GOAL #3   Title  Patient will ambulate 50 ft without rest breaks to increase functional independence.     Baseline  ambulates 30 ft  6/26: 60 ft with one rest break with RW 8/19: walk 130 ft with RW and CGA and no rest breaks    Time  2    Period  Weeks    Status  Achieved      PT SHORT TERM GOAL #4   Title  Patient will increase BLE gross strength to 4-/5 as to improve functional strength for independent gait, increased standing tolerance and increased ADL ability.    Baseline  LLE 3/5 gross : 6/26: LLE 4-/5    Time  2    Period  Weeks    Status  Achieved        PT Long Term Goals -  08/13/18 0904      PT  LONG TERM GOAL #1   Title  Patient will increase BLE gross strength to 4+/5 as to improve functional strength for independent gait, increased standing tolerance and increased ADL ability.    Baseline  3/6 and 5/8: LLE gross 3/5: 6/26: 4-/5 8/19: 4-/5 hip 4/5 knee    Time  8    Period  Weeks    Status  Partially Met    Target Date  10/08/18      PT LONG TERM GOAL #2   Title  Patient will increase lower extremity functional scale to >60/80 to demonstrate improved functional mobility and increased tolerance with ADLs.     Baseline  3/6: 24/80 5/8: 24/80 6/26: 33/80 8/18: 39/80     Time  8    Period  Weeks    Status  Partially Met    Target Date  10/08/18      PT LONG TERM GOAL #3   Title  Patient (< 73 years old) will complete five times sit to stand test in < 10 seconds indicating an increased LE strength and improved balance    Baseline  3/6: 46 seconds with UE support 5/8: 39 seconds with excessive UE support ; 6/26: 18 seconds with excessive UE support with one LOB ; 8/19: 22 seconds with decreased UE support no LOB    Time  8    Period  Weeks    Status  Partially Met    Target Date  10/08/18      PT LONG TERM GOAL #4   Title  Patient will increase 10 meter walk test to >1.2ms as to improve gait speed for better community ambulation and to reduce fall risk    Baseline  .161m 6/26: .41 m/s 8/19: .41 m/s     Time  8    Period  Weeks    Status  Partially Met    Target Date  10/08/18      PT LONG TERM GOAL #5   Title  Patient will increase ABC scale score >80% to demonstrate better functional mobility and better confidence with ADLs.     Baseline  5/8: 8.4%; 6/26: 44% 8/19: 48.8%    Time  8    Period  Weeks    Status  Partially Met    Target Date  10/08/18      Additional Long Term Goals   Additional Long Term Goals  Yes      PT LONG TERM GOAL #6   Title  Patient will ascend/descend a flight of stairs with Mod I with UE assistance to increase funcitonal mobility in family  residencies.     Baseline  8/19: able to perform for 4 steps at most     Time  8    Period  Weeks    Status  New    Target Date  10/08/18            Plan - 09/11/18 1014    Clinical Impression Statement  Patient was limited today due to onset of LLE neural pain. Patient ambulated [id number] hallway which helped decrease neural symptoms in LLE. Patient had significant increase in pain with standing step overs and exercise had to be terminated due to potential flexion pattern required for task. Session then focused on seated and pain relief activities including LAQ and rocker board to decrease neural tension and possible peripheral nerve pain. Patient reported decrease in pain at end of  session. Patient was advised to discuss symptoms with doctor today and verbalized understanding. Patient will continue to benefit from skilled physical therapy services to improve strength, mobility, and gait mechanics for increase quality of life.     Clinical Impairments Affecting Rehab Potential  + good motivation, +performing HEP from previous home health -chronicity, body habitus     PT Frequency  1x / week    PT Duration  8 weeks    PT Treatment/Interventions  ADLs/Self Care Home Management;Cryotherapy;Ultrasound;Traction;Moist Heat;Electrical Stimulation;DME Instruction;Gait training;Stair training;Balance training;Therapeutic exercise;Therapeutic activities;Functional mobility training;Neuromuscular re-education;Patient/family education;Orthotic Fit/Training;Manual techniques;Wheelchair mobility training;Passive range of motion;Energy conservation;Taping    PT Next Visit Plan  ambulatory, LE strengthening.     PT Home Exercise Plan  added LAQ and assisted DF to HEP    Consulted and Agree with Plan of Care  Patient       Patient will benefit from skilled therapeutic intervention in order to improve the following deficits and impairments:  Abnormal gait, Decreased activity tolerance, Decreased balance,  Decreased endurance, Decreased knowledge of precautions, Decreased mobility, Decreased strength, Difficulty walking, Impaired perceived functional ability, Impaired sensation, Postural dysfunction, Improper body mechanics, Pain  Visit Diagnosis: Other abnormalities of gait and mobility  Difficulty in walking, not elsewhere classified  Muscle weakness (generalized)     Problem List Patient Active Problem List   Diagnosis Date Noted  . Sepsis ([place]) 04/02/2018  . 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 CKD (chronic kidney disease) stage 3, GFR 30-59 ml/min (HCC) 07/04/2017  . Class 3 drug-induced obesity with serious comorbidity and body mass index (BMI) of 45.0 to 49.9 in adult ([place]) 07/04/2017  . Chronic feet pain (Location of Primary Source of Pain) (Bilateral) (R>L) 04/25/2017  . Diabetic peripheral neuropathy (HCC) (Location of Primary Source of Pain) (Bilateral) (R>L) 04/25/2017  . Chronic lower extremity pain (Location of Secondary source of pain) (Bilateral) (R>L) 04/25/2017  . Chronic pain syndrome 04/25/2017  . Long term (current) use of opiate analgesic 04/25/2017  . Long term prescription opiate use 04/25/2017  . Opiate use 04/25/2017  . Chronic hand pain (Location of Tertiary source of pain) (Bilateral) (L>R) 04/25/2017  . Carpal tunnel syndrome (Bilateral) (L>R) 04/25/2017  . History of stroke 04/25/2017  . Infected decubitus ulcer, unstageable ([place]) 10/26/2016  . Closed fracture of humerus, surgical neck 05/31/2016  . Closed 3-part fracture of surgical neck of right humerus with delayed healing 05/31/2016  . Pain in shoulder 05/19/2016  . Edema leg 05/18/2016  . 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 Kidney lump 05/18/2016  . Bilateral edema of lower extremity 05/18/2016  . Bilateral leg weakness 05/18/2016  . Kidney mass 05/18/2016  . Pressure ulcer 04/24/2016  . History of DKA (diabetic ketoacidosis) ([place]) 04/23/2016    Class: History of  . Diabetes mellitus  ([place]) 04/23/2016  . Other specified diabetes mellitus with ketoacidosis without coma ([place]) 04/23/2016    Erick Blinks, SPT This entire session was performed under direct supervision and direction of a licensed therapist/therapist assistant . I have personally read, edited and approve of the note as written.  Janna Arch, PT, DPT   09/11/2018, 10:27 AM  [place] MAIN [facility] SERVICES [address] [place], [place], [address] Phone: [phone]   Fax:  [phone]  Name: [name] MRN: [id number] Date of Birth: [birth year]

## 2018-09-18 ENCOUNTER — Ambulatory Visit: Payer: BLUE CROSS/BLUE SHIELD

## 2018-09-18 DIAGNOSIS — R2689 Other abnormalities of gait and mobility: Secondary | ICD-10-CM | POA: Diagnosis not present

## 2018-09-18 DIAGNOSIS — R262 Difficulty in walking, not elsewhere classified: Secondary | ICD-10-CM

## 2018-09-18 DIAGNOSIS — M6281 Muscle weakness (generalized): Secondary | ICD-10-CM

## 2018-09-18 NOTE — Therapy (Signed)
[place] MAIN [facility] SERVICES [address] [place], [place], [address] Phone: [phone]   Fax:  [phone]  Physical Therapy Treatment  Patient Details  Name: [name] MRN: [id number] Date of Birth: [birth year] Referring Provider: Dr. Willadean Carol   Encounter Date: 09/18/2018  PT End of Session - 09/18/18 0919    Visit Number  21    Number of Visits  24    Date for PT Re-Evaluation  10/08/18    Authorization Type  5/10 PN  (starting 8/19)     PT Start Time  0905    PT Stop Time  0945    PT Time Calculation (min)  40 min    Equipment Utilized During Treatment  Gait belt    Activity Tolerance  Patient limited by pain;Patient limited by fatigue    Behavior During Therapy  [facility] for tasks assessed/performed       Past Medical History:  Diagnosis Date  . Acute pain of right shoulder 05/19/2016  . Acute PN (pyelonephritis) 05/18/2016  . Acute pyelonephritis 05/18/2016  . Anxiety   . Arthritis   . Asthma   . Diabetes mellitus without complication ([place])   . GERD (gastroesophageal reflux disease)   . Glaucoma   . Hyperlipemia   . Hyperlipemia   . Hypertension     Past Surgical History:  Procedure Laterality Date  . ANTERIOR CRUCIATE LIGAMENT REPAIR      There were no vitals filed for this visit.  Subjective Assessment - 09/18/18 0909    Subjective  Patient reports that the sharp "electric shocks" turn into burning feeling. Called neurologist who had the closest open appointment on Oct 24th. Saw general PC yesterday who agreed she needed to see neurologist.     Pertinent History  Patient presented in manual wheelchair with bilateral AFOs. Was seen by this therapist prior to admittance to hospital on 03/28/18 for sepsis.  April of 2017 patient came home from work reported not feeling good, The next couple days was not feeling good. Went to doctor who called EMS to take her to the hospital. Was unable to get a number on her sugars  initially due to patient not taking her medicine due to not feeling well and not eating. Sugar was read to be in the 700's, also found multiple strains of a UTI/sepsis while there. Was in ICU for 12 days at [facility] then went to [facility]. While at [facility] was not walked or gotten up so after the fourth months there she returned home with in home therapy. Has not had any physical therapy since October. Gets around house with walker ok or in her wheelchair. Has pressure ulcer on Right buttocks with pmh including GERD, DM type II, HTN, and polyneuropathy. Patient wants to get stronger to be able to walk and stand again for more independence and to help her mother.      Limitations  Lifting;Standing;Walking;House hold activities;Other (comment)    How long can you sit comfortably?  hour    How long can you stand comfortably?  stand 5 minutes with walker     How long can you walk comfortably?  10 ft    Patient Stated Goals  walk better, want to drive, stand for longer, help her mom    Currently in Pain?  Yes    Pain Score  8     Pain Location  Leg    Pain Orientation  Left    Pain  Descriptors / Indicators  Burning    Pain Type  Neuropathic pain;Acute pain    Pain Radiating Towards  radiating up and down leg     Pain Onset  More than a month ago    Pain Frequency  Constant        Seated:             Marches BLE x15 with noted mild compensatory posterior trunk lean with raising LLE; compensation corrected by utilizing forward trunk lean to increase core activation. ;     Hamstring stretch with slump nerve glides (modifed) LLE 2 minutes, RLE hamstring stretch 60 seconds.                Seated LAQ 15x 5 second holds, BLE ; 2lb ankle weights                TrA seated activation 15x 5 second holds                Gluteal squeezes 15x  5 second holds                Hip adduction squeezes ball 15x 5 seconds .      Standing               Hamstring curls 10x each leg BUE support; per patient report  LLE more difficult to bend and more fatiguing.             Hip abduction 10x each leg BUE support       Airex pad: static balance 3x30 seconds; occasional posterior LOB requiring UE support for stability             6" step toe taps BUE support 10x each leg ; cues for maintaining weight shift   Pt requires long rest breaks between sets and between exercises due to quickness to fatigue and overheating; seated ice pack use.    Pt. response to medical necessity:  Patient will continue to benefit from skilled physical therapy to increase strength, mobility, and gait mechanics for increased quality of life                        PT Education - 09/18/18 0911    Education provided  Yes    Education Details  follow up with neurologist, continue HEP, exercise technique     Person(s) Educated  Patient    Methods  Explanation;Demonstration;Verbal cues    Comprehension  Verbalized understanding;Returned demonstration       PT Short Term Goals - 08/13/18 0903      PT SHORT TERM GOAL #1   Title  Patient will be independent in home exercise program to improve strength/mobility for better functional independence with ADLs.    Baseline  HEP compliant    Time  2    Period  Weeks    Status  Achieved      PT SHORT TERM GOAL #2   Title   Patient (< 31 years old) will complete five times sit to stand test in < 20 seconds indicating an increased LE strength and improved balance    Baseline  5/8: 39 seconds with excessive UE support. 8/19: 22 seconds     Time  2    Period  Weeks    Status  Partially Met      PT SHORT TERM GOAL #3   Title  Patient will ambulate 50 ft without rest breaks to increase  functional independence.     Baseline  ambulates 30 ft  6/26: 60 ft with one rest break with RW 8/19: walk 130 ft with RW and CGA and no rest breaks    Time  2    Period  Weeks    Status  Achieved      PT SHORT TERM GOAL #4   Title  Patient will increase BLE gross strength to  4-/5 as to improve functional strength for independent gait, increased standing tolerance and increased ADL ability.    Baseline  LLE 3/5 gross : 6/26: LLE 4-/5    Time  2    Period  Weeks    Status  Achieved        PT Long Term Goals - 08/13/18 5456      PT LONG TERM GOAL #1   Title  Patient will increase BLE gross strength to 4+/5 as to improve functional strength for independent gait, increased standing tolerance and increased ADL ability.    Baseline  3/6 and 5/8: LLE gross 3/5: 6/26: 4-/5 8/19: 4-/5 hip 4/5 knee    Time  8    Period  Weeks    Status  Partially Met    Target Date  10/08/18      PT LONG TERM GOAL #2   Title  Patient will increase lower extremity functional scale to >60/80 to demonstrate improved functional mobility and increased tolerance with ADLs.     Baseline  3/6: 24/80 5/8: 24/80 6/26: 33/80 8/18: 39/80     Time  8    Period  Weeks    Status  Partially Met    Target Date  10/08/18      PT LONG TERM GOAL #3   Title  Patient (< 67 years old) will complete five times sit to stand test in < 10 seconds indicating an increased LE strength and improved balance    Baseline  3/6: 46 seconds with UE support 5/8: 39 seconds with excessive UE support ; 6/26: 18 seconds with excessive UE support with one LOB ; 8/19: 22 seconds with decreased UE support no LOB    Time  8    Period  Weeks    Status  Partially Met    Target Date  10/08/18      PT LONG TERM GOAL #4   Title  Patient will increase 10 meter walk test to >1.59ms as to improve gait speed for better community ambulation and to reduce fall risk    Baseline  .175m 6/26: .41 m/s 8/19: .41 m/s     Time  8    Period  Weeks    Status  Partially Met    Target Date  10/08/18      PT LONG TERM GOAL #5   Title  Patient will increase ABC scale score >80% to demonstrate better functional mobility and better confidence with ADLs.     Baseline  5/8: 8.4%; 6/26: 44% 8/19: 48.8%    Time  8    Period  Weeks     Status  Partially Met    Target Date  10/08/18      Additional Long Term Goals   Additional Long Term Goals  Yes      PT LONG TERM GOAL #6   Title  Patient will ascend/descend a flight of stairs with Mod I with UE assistance to increase funcitonal mobility in family residencies.     Baseline  8/19: able to perform for  4 steps at most     Time  8    Period  Weeks    Status  New    Target Date  10/08/18            Plan - 09/18/18 2694    Clinical Impression Statement  Patient requires occasional UE support to stabilize and return to [facility] when static standing on unstable surface due to poor/limited ankle and hip response resulting in excessive trunk mobility. Patient fatigued quickly with static standing on unstable surfaces requiring seated interventions a break. "electric shock" pain of LE has now transitioned to burning pain per patient report and patient encouraged to continue following up with neurologist to ensure no neurological injury. Patient will continue to benefit from skilled physical therapy services to improve strength, mobility, and gait mechanics for increase quality of life    Clinical Impairments Affecting Rehab Potential  + good motivation, +performing HEP from previous home health -chronicity, body habitus     PT Frequency  1x / week    PT Duration  8 weeks    PT Treatment/Interventions  ADLs/Self Care Home Management;Cryotherapy;Ultrasound;Traction;Moist Heat;Electrical Stimulation;DME Instruction;Gait training;Stair training;Balance training;Therapeutic exercise;Therapeutic activities;Functional mobility training;Neuromuscular re-education;Patient/family education;Orthotic Fit/Training;Manual techniques;Wheelchair mobility training;Passive range of motion;Energy conservation;Taping    PT Next Visit Plan  ambulatory, LE strengthening.     PT Home Exercise Plan  added LAQ and assisted DF to HEP    Consulted and Agree with Plan of Care  Patient       Patient will  benefit from skilled therapeutic intervention in order to improve the following deficits and impairments:  Abnormal gait, Decreased activity tolerance, Decreased balance, Decreased endurance, Decreased knowledge of precautions, Decreased mobility, Decreased strength, Difficulty walking, Impaired perceived functional ability, Impaired sensation, Postural dysfunction, Improper body mechanics, Pain  Visit Diagnosis: Other abnormalities of gait and mobility  Difficulty in walking, not elsewhere classified  Muscle weakness (generalized)     Problem List Patient Active Problem List   Diagnosis Date Noted  . Sepsis ([place]) 04/02/2018  . 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 CKD (chronic kidney disease) stage 3, GFR 30-59 ml/min (HCC) 07/04/2017  . Class 3 drug-induced obesity with serious comorbidity and body mass index (BMI) of 45.0 to 49.9 in adult ([place]) 07/04/2017  . Chronic feet pain (Location of Primary Source of Pain) (Bilateral) (R>L) 04/25/2017  . Diabetic peripheral neuropathy (HCC) (Location of Primary Source of Pain) (Bilateral) (R>L) 04/25/2017  . Chronic lower extremity pain (Location of Secondary source of pain) (Bilateral) (R>L) 04/25/2017  . Chronic pain syndrome 04/25/2017  . Long term (current) use of opiate analgesic 04/25/2017  . Long term prescription opiate use 04/25/2017  . Opiate use 04/25/2017  . Chronic hand pain (Location of Tertiary source of pain) (Bilateral) (L>R) 04/25/2017  . Carpal tunnel syndrome (Bilateral) (L>R) 04/25/2017  . History of stroke 04/25/2017  . Infected decubitus ulcer, unstageable ([place]) 10/26/2016  . Closed fracture of humerus, surgical neck 05/31/2016  . Closed 3-part fracture of surgical neck of right humerus with delayed healing 05/31/2016  . Pain in shoulder 05/19/2016  . Edema leg 05/18/2016  . 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 Kidney lump 05/18/2016  . Bilateral edema of lower extremity 05/18/2016  . Bilateral leg weakness  05/18/2016  . Kidney mass 05/18/2016  . Pressure ulcer 04/24/2016  . History of DKA (diabetic ketoacidosis) ([place]) 04/23/2016    Class: History of  . Diabetes mellitus ([place]) 04/23/2016  . Other specified diabetes mellitus with ketoacidosis without coma ([place])  04/23/2016   Janna Arch, PT, DPT   09/18/2018, 9:46 AM  [place] MAIN [facility] SERVICES [address] [place], [place], [address] Phone: [phone]   Fax:  [phone]  Name: Erienne Spelman Riemann MRN: [id number] Date of Birth: [birth year]

## 2018-09-25 ENCOUNTER — Ambulatory Visit: Payer: Medicare HMO | Attending: Family Medicine

## 2018-09-25 DIAGNOSIS — R262 Difficulty in walking, not elsewhere classified: Secondary | ICD-10-CM | POA: Diagnosis present

## 2018-09-25 DIAGNOSIS — R2689 Other abnormalities of gait and mobility: Secondary | ICD-10-CM | POA: Insufficient documentation

## 2018-09-25 DIAGNOSIS — M6281 Muscle weakness (generalized): Secondary | ICD-10-CM | POA: Insufficient documentation

## 2018-09-25 NOTE — Therapy (Addendum)
[place] MAIN [facility] SERVICES [address] [place], [place], [address] Phone: [phone]   Fax:  [phone]  Physical Therapy Treatment  Patient Details  Name: [name] MRN: [id number] Date of Birth: 1961-04-02 Referring Provider (PT): Dr. Willadean Carol   Encounter Date: 09/25/2018  PT End of Session - 09/25/18 0958    Visit Number  22    Number of Visits  24    Date for PT Re-Evaluation  10/08/18    Authorization Type  6/10 PN  (starting 8/19)     PT Start Time  0859    PT Stop Time  0945    PT Time Calculation (min)  46 min    Equipment Utilized During Treatment  Gait belt    Activity Tolerance  Patient limited by fatigue;Patient tolerated treatment well    Behavior During Therapy  [facility] for tasks assessed/performed       Past Medical History:  Diagnosis Date  . Acute pain of right shoulder 05/19/2016  . Acute PN (pyelonephritis) 05/18/2016  . Acute pyelonephritis 05/18/2016  . Anxiety   . Arthritis   . Asthma   . Diabetes mellitus without complication ([place])   . GERD (gastroesophageal reflux disease)   . Glaucoma   . Hyperlipemia   . Hyperlipemia   . Hypertension     Past Surgical History:  Procedure Laterality Date  . ANTERIOR CRUCIATE LIGAMENT REPAIR      There were no vitals filed for this visit.  Subjective Assessment - 09/25/18 0956    Subjective  Patient states she is still on wait list to see neurologist. Reports burning feeling is worse and that it will just randomly occur. Thinks HEP may help some but not much.     Pertinent History  Patient presented in manual wheelchair with bilateral AFOs. Was seen by this therapist prior to admittance to hospital on 03/28/18 for sepsis.  April of 2017 patient came home from work reported not feeling good, The next couple days was not feeling good. Went to doctor who called EMS to take her to the hospital. Was unable to get a number on her sugars initially due to patient  not taking her medicine due to not feeling well and not eating. Sugar was read to be in the 700's, also found multiple strains of a UTI/sepsis while there. Was in ICU for 12 days at [facility] then went to [facility]. While at [facility] was not walked or gotten up so after the fourth months there she returned home with in home therapy. Has not had any physical therapy since October. Gets around house with walker ok or in her wheelchair. Has pressure ulcer on Right buttocks with pmh including GERD, DM type II, HTN, and polyneuropathy. Patient wants to get stronger to be able to walk and stand again for more independence and to help her mother.      Limitations  Lifting;Standing;Walking;House hold activities;Other (comment)    How long can you sit comfortably?  hour    How long can you stand comfortably?  stand 5 minutes with walker     How long can you walk comfortably?  10 ft    Patient Stated Goals  walk better, want to drive, stand for longer, help her mom    Currently in Pain?  Yes    Pain Score  7     Pain Location  Leg    Pain Orientation  Left    Pain Descriptors /  Indicators  Burning    Pain Type  Acute pain;Neuropathic pain    Pain Onset  More than a month ago          Seated:             Marches BLE x15. Verbal cues to decrease speed of movement.                            Hamstring stretch with slump nerve glides (modifed) LLE 2 minutes, RLE hamstring stretch 60  seconds.               Seated LAQ 15x 5 second holds, BLE                  Hip adduction squeezes ball 15x 5 seconds .      Hip abduction with RTB 10x 5 second holds.   Standing    Ambulate 120 ft with RW  WC follow and CGA in hall. Cues for widening BOS and bring knee towards the ceiling. Seated rest break at 45f.   Sit to stands from wheel chair x5 with BUE in //bars. Verbal cues to push through LEs.   Hamstring curls 10x each leg BUE support; per patient report LLE more difficult to bend and more fatiguing.    Modified tandem stance 2x 30seconds each leg. 1 UE support CGA. More difficulty with LLE in front.    Airex pad: static balance 3x30 seconds; occasional posterior LOB requiring UE support for stability       Pt requires long rest breaks between sets and between exercises due to quickness to fatigue and overheating; seated ice pack use.    Pt. response to medical necessity:  Patient will continue to benefit from skilled physical therapy to increase strength, mobility, and gait mechanics for increased quality of life                      PT Education - 09/25/18 0958    Education provided  Yes    Education Details  follow up with neurologist, continue HEP, exercise technique    Person(s) Educated  Patient    Methods  Explanation;Demonstration;Verbal cues    Comprehension  Verbalized understanding;Returned demonstration       PT Short Term Goals - 08/13/18 0903      PT SHORT TERM GOAL #1   Title  Patient will be independent in home exercise program to improve strength/mobility for better functional independence with ADLs.    Baseline  HEP compliant    Time  2    Period  Weeks    Status  Achieved      PT SHORT TERM GOAL #2   Title   Patient (< 61years old) will complete five times sit to stand test in < 20 seconds indicating an increased LE strength and improved balance    Baseline  5/8: 39 seconds with excessive UE support. 8/19: 22 seconds     Time  2    Period  Weeks    Status  Partially Met      PT SHORT TERM GOAL #3   Title  Patient will ambulate 50 ft without rest breaks to increase functional independence.     Baseline  ambulates 30 ft  6/26: 60 ft with one rest break with RW 8/19: walk 130 ft with RW and CGA and no rest breaks    Time  2  Period  Weeks    Status  Achieved      PT SHORT TERM GOAL #4   Title  Patient will increase BLE gross strength to 4-/5 as to improve functional strength for independent gait, increased standing tolerance and  increased ADL ability.    Baseline  LLE 3/5 gross : 6/26: LLE 4-/5    Time  2    Period  Weeks    Status  Achieved        PT Long Term Goals - 08/13/18 6333      PT LONG TERM GOAL #1   Title  Patient will increase BLE gross strength to 4+/5 as to improve functional strength for independent gait, increased standing tolerance and increased ADL ability.    Baseline  3/6 and 5/8: LLE gross 3/5: 6/26: 4-/5 8/19: 4-/5 hip 4/5 knee    Time  8    Period  Weeks    Status  Partially Met    Target Date  10/08/18      PT LONG TERM GOAL #2   Title  Patient will increase lower extremity functional scale to >60/80 to demonstrate improved functional mobility and increased tolerance with ADLs.     Baseline  3/6: 24/80 5/8: 24/80 6/26: 33/80 8/18: 39/80     Time  8    Period  Weeks    Status  Partially Met    Target Date  10/08/18      PT LONG TERM GOAL #3   Title  Patient (< 49 years old) will complete five times sit to stand test in < 10 seconds indicating an increased LE strength and improved balance    Baseline  3/6: 46 seconds with UE support 5/8: 39 seconds with excessive UE support ; 6/26: 18 seconds with excessive UE support with one LOB ; 8/19: 22 seconds with decreased UE support no LOB    Time  8    Period  Weeks    Status  Partially Met    Target Date  10/08/18      PT LONG TERM GOAL #4   Title  Patient will increase 10 meter walk test to >1.28ms as to improve gait speed for better community ambulation and to reduce fall risk    Baseline  .160m 6/26: .41 m/s 8/19: .41 m/s     Time  8    Period  Weeks    Status  Partially Met    Target Date  10/08/18      PT LONG TERM GOAL #5   Title  Patient will increase ABC scale score >80% to demonstrate better functional mobility and better confidence with ADLs.     Baseline  5/8: 8.4%; 6/26: 44% 8/19: 48.8%    Time  8    Period  Weeks    Status  Partially Met    Target Date  10/08/18      Additional Long Term Goals   Additional  Long Term Goals  Yes      PT LONG TERM GOAL #6   Title  Patient will ascend/descend a flight of stairs with Mod I with UE assistance to increase funcitonal mobility in family residencies.     Baseline  8/19: able to perform for 4 steps at most     Time  8    Period  Weeks    Status  New    Target Date  10/08/18            Plan -  09/25/18 0955    Clinical Impression Statement  Patient challenged with ambulation requiring seated rest break and shorter distance due to fatigue. Patient dragged R foot twice with increased cueing to weight shift and lift RLE for foot clearance. Introduced modified tandem stance with increased posterior sway and difficulty with LLE in front due to weakness. Patient will continue to benefit from skilled physical therapy to improve strength, mobility, gait mechanics, and increase quality of life.     Clinical Impairments Affecting Rehab Potential  + good motivation, +performing HEP from previous home health -chronicity, body habitus     PT Frequency  1x / week    PT Duration  8 weeks    PT Treatment/Interventions  ADLs/Self Care Home Management;Cryotherapy;Ultrasound;Traction;Moist Heat;Electrical Stimulation;DME Instruction;Gait training;Stair training;Balance training;Therapeutic exercise;Therapeutic activities;Functional mobility training;Neuromuscular re-education;Patient/family education;Orthotic Fit/Training;Manual techniques;Wheelchair mobility training;Passive range of motion;Energy conservation;Taping    PT Next Visit Plan  ambulatory, LE strengthening.     PT Home Exercise Plan  added LAQ and assisted DF to HEP    Consulted and Agree with Plan of Care  Patient       Patient will benefit from skilled therapeutic intervention in order to improve the following deficits and impairments:  Abnormal gait, Decreased activity tolerance, Decreased balance, Decreased endurance, Decreased knowledge of precautions, Decreased mobility, Decreased strength, Difficulty  walking, Impaired perceived functional ability, Impaired sensation, Postural dysfunction, Improper body mechanics, Pain  Visit Diagnosis: Other abnormalities of gait and mobility  Difficulty in walking, not elsewhere classified  Muscle weakness (generalized)     Problem List Patient Active Problem List   Diagnosis Date Noted  . Sepsis ([place]) 04/02/2018  . 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 CKD (chronic kidney disease) stage 3, GFR 30-59 ml/min (HCC) 07/04/2017  . Class 3 drug-induced obesity with serious comorbidity and body mass index (BMI) of 45.0 to 49.9 in adult ([place]) 07/04/2017  . Chronic feet pain (Location of Primary Source of Pain) (Bilateral) (R>L) 04/25/2017  . Diabetic peripheral neuropathy (HCC) (Location of Primary Source of Pain) (Bilateral) (R>L) 04/25/2017  . Chronic lower extremity pain (Location of Secondary source of pain) (Bilateral) (R>L) 04/25/2017  . Chronic pain syndrome 04/25/2017  . Long term (current) use of opiate analgesic 04/25/2017  . Long term prescription opiate use 04/25/2017  . Opiate use 04/25/2017  . Chronic hand pain (Location of Tertiary source of pain) (Bilateral) (L>R) 04/25/2017  . Carpal tunnel syndrome (Bilateral) (L>R) 04/25/2017  . History of stroke 04/25/2017  . Infected decubitus ulcer, unstageable ([place]) 10/26/2016  . Closed fracture of humerus, surgical neck 05/31/2016  . Closed 3-part fracture of surgical neck of right humerus with delayed healing 05/31/2016  . Pain in shoulder 05/19/2016  . Edema leg 05/18/2016  . 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 Kidney lump 05/18/2016  . Bilateral edema of lower extremity 05/18/2016  . Bilateral leg weakness 05/18/2016  . Kidney mass 05/18/2016  . Pressure ulcer 04/24/2016  . History of DKA (diabetic ketoacidosis) ([place]) 04/23/2016    Class: History of  . Diabetes mellitus ([place]) 04/23/2016  . Other specified diabetes mellitus with ketoacidosis without coma ([place]) 04/23/2016    Erick Blinks, SPT  This entire session was performed under direct supervision and direction of a licensed therapist/therapist assistant . I have personally read, edited and approve of the note as written.  Janna Arch, PT, DPT   09/25/2018, 11:32 AM  [place] MAIN [facility] SERVICES [address] [place], [place], [address] Phone: [phone]   Fax:  [phone]  Name: [name] MRN: [id number] Date of Birth: 11-15-1961

## 2018-10-02 ENCOUNTER — Ambulatory Visit: Payer: Medicare HMO

## 2018-10-02 DIAGNOSIS — R262 Difficulty in walking, not elsewhere classified: Secondary | ICD-10-CM

## 2018-10-02 DIAGNOSIS — R2689 Other abnormalities of gait and mobility: Secondary | ICD-10-CM | POA: Diagnosis not present

## 2018-10-02 DIAGNOSIS — M6281 Muscle weakness (generalized): Secondary | ICD-10-CM

## 2018-10-02 NOTE — Therapy (Addendum)
[place] MAIN [facility] SERVICES [address] [place], [place], [address] Phone: [phone]   Fax:  [phone]  Physical Therapy Treatment  Patient Details  Name: [name] MRN: [id number] Date of Birth: 1961/06/17 Referring Provider (PT): Dr. Willadean Carol   Encounter Date: 10/02/2018  PT End of Session - 10/02/18 0957    Visit Number  23    Number of Visits  24    Date for PT Re-Evaluation  10/08/18    Authorization Type  7/10 PN  (starting 8/19)     PT Start Time  0858    PT Stop Time  0945    PT Time Calculation (min)  47 min    Equipment Utilized During Treatment  Gait belt    Activity Tolerance  Patient limited by fatigue;Patient tolerated treatment well    Behavior During Therapy  [facility] for tasks assessed/performed       Past Medical History:  Diagnosis Date  . Acute pain of right shoulder 05/19/2016  . Acute PN (pyelonephritis) 05/18/2016  . Acute pyelonephritis 05/18/2016  . Anxiety   . Arthritis   . Asthma   . Diabetes mellitus without complication ([place])   . GERD (gastroesophageal reflux disease)   . Glaucoma   . Hyperlipemia   . Hyperlipemia   . Hypertension     Past Surgical History:  Procedure Laterality Date  . ANTERIOR CRUCIATE LIGAMENT REPAIR      There were no vitals filed for this visit.  Subjective Assessment - 10/02/18 0904    Subjective  Patient reports she is not feeling well today. States her appointment to see neurologist is on Oct 24 and she is still on waitlist. States she almost fell this weekend when standing up from wheel chair to relieve pain but was able to catch herself and sit back down.  Patient states she has a wound on L foot  that she has seen the wound doctor about already. Is supposed to keep it bandaged but didn't bandage it this morning.     Pertinent History  Patient presented in manual wheelchair with bilateral AFOs. Was seen by this therapist prior to admittance to hospital on  03/28/18 for sepsis.  April of 2017 patient came home from work reported not feeling good, The next couple days was not feeling good. Went to doctor who called EMS to take her to the hospital. Was unable to get a number on her sugars initially due to patient not taking her medicine due to not feeling well and not eating. Sugar was read to be in the 700's, also found multiple strains of a UTI/sepsis while there. Was in ICU for 12 days at [facility] then went to [facility]. While at [facility] was not walked or gotten up so after the fourth months there she returned home with in home therapy. Has not had any physical therapy since October. Gets around house with walker ok or in her wheelchair. Has pressure ulcer on Right buttocks with pmh including GERD, DM type II, HTN, and polyneuropathy. Patient wants to get stronger to be able to walk and stand again for more independence and to help her mother.      Limitations  Lifting;Standing;Walking;House hold activities;Other (comment)    How long can you sit comfortably?  hour    How long can you stand comfortably?  stand 5 minutes with walker     How long can you walk comfortably?  10 ft  Patient Stated Goals  walk better, want to drive, stand for longer, help her mom    Currently in Pain?  Yes    Pain Score  8     Pain Location  Leg    Pain Orientation  Left    Pain Descriptors / Indicators  Burning;Stabbing    Pain Type  Acute pain;Neuropathic pain    Multiple Pain Sites  Yes    Pain Score  6    Pain Location  Leg    Pain Orientation  Right    Pain Descriptors / Indicators  Stabbing    Pain Type  Neuropathic pain    Pain Frequency  Constant         Supine   SAQ x10 3 second holds each leg. Verbal cues to control downward motion.   Marching with bolster under knee. x15 3 second holds. Verbal cues to maintain LE alignment  TrA activation x15 5 second holds.   Posterior pelvic tilts x10 3 second holds. Verbal cues to push back towards the  table.  Adduction ball squeezes x10 3 second holds with bolster under knees.   Abduction with RTB x15 3 second holds with bolster under knees. Verbal cues to maintain contraction.   Ankle pumps with LLE 2x20. Active assist and was faded with increased contraction. Inreased muscle contraction noted with repetition.   Wound on heel of LLE 2cm length 1.5cm wide noticeable slough, sanguinous exudate, callused rim, remnants of dirt and sock fuzz within wound.   -wound covered with guaze and bandage to decrease contamination/exude.                      PT Education - 10/02/18 0915    Education provided  Yes    Education Details  HEP, exercise technique, follow up with wound care doctor    Person(s) Educated  Patient    Methods  Explanation;Demonstration;Verbal cues    Comprehension  Verbalized understanding;Returned demonstration       PT Short Term Goals - 08/13/18 0903      PT SHORT TERM GOAL #1   Title  Patient will be independent in home exercise program to improve strength/mobility for better functional independence with ADLs.    Baseline  HEP compliant    Time  2    Period  Weeks    Status  Achieved      PT SHORT TERM GOAL #2   Title   Patient (< 66 years old) will complete five times sit to stand test in < 20 seconds indicating an increased LE strength and improved balance    Baseline  5/8: 39 seconds with excessive UE support. 8/19: 22 seconds     Time  2    Period  Weeks    Status  Partially Met      PT SHORT TERM GOAL #3   Title  Patient will ambulate 50 ft without rest breaks to increase functional independence.     Baseline  ambulates 30 ft  6/26: 60 ft with one rest break with RW 8/19: walk 130 ft with RW and CGA and no rest breaks    Time  2    Period  Weeks    Status  Achieved      PT SHORT TERM GOAL #4   Title  Patient will increase BLE gross strength to 4-/5 as to improve functional strength for independent gait, increased standing tolerance  and increased ADL ability.    Baseline  LLE 3/5 gross : 6/26: LLE 4-/5    Time  2    Period  Weeks    Status  Achieved        PT Long Term Goals - 08/13/18 4174      PT LONG TERM GOAL #1   Title  Patient will increase BLE gross strength to 4+/5 as to improve functional strength for independent gait, increased standing tolerance and increased ADL ability.    Baseline  3/6 and 5/8: LLE gross 3/5: 6/26: 4-/5 8/19: 4-/5 hip 4/5 knee    Time  8    Period  Weeks    Status  Partially Met    Target Date  10/08/18      PT LONG TERM GOAL #2   Title  Patient will increase lower extremity functional scale to >60/80 to demonstrate improved functional mobility and increased tolerance with ADLs.     Baseline  3/6: 24/80 5/8: 24/80 6/26: 33/80 8/18: 39/80     Time  8    Period  Weeks    Status  Partially Met    Target Date  10/08/18      PT LONG TERM GOAL #3   Title  Patient (< 43 years old) will complete five times sit to stand test in < 10 seconds indicating an increased LE strength and improved balance    Baseline  3/6: 46 seconds with UE support 5/8: 39 seconds with excessive UE support ; 6/26: 18 seconds with excessive UE support with one LOB ; 8/19: 22 seconds with decreased UE support no LOB    Time  8    Period  Weeks    Status  Partially Met    Target Date  10/08/18      PT LONG TERM GOAL #4   Title  Patient will increase 10 meter walk test to >1.9ms as to improve gait speed for better community ambulation and to reduce fall risk    Baseline  .159m 6/26: .41 m/s 8/19: .41 m/s     Time  8    Period  Weeks    Status  Partially Met    Target Date  10/08/18      PT LONG TERM GOAL #5   Title  Patient will increase ABC scale score >80% to demonstrate better functional mobility and better confidence with ADLs.     Baseline  5/8: 8.4%; 6/26: 44% 8/19: 48.8%    Time  8    Period  Weeks    Status  Partially Met    Target Date  10/08/18      Additional Long Term Goals   Additional  Long Term Goals  Yes      PT LONG TERM GOAL #6   Title  Patient will ascend/descend a flight of stairs with Mod I with UE assistance to increase funcitonal mobility in family residencies.     Baseline  8/19: able to perform for 4 steps at most     Time  8    Period  Weeks    Status  New    Target Date  10/08/18            Plan - 10/02/18 0955    Clinical Impression Statement  Patient presents to therapy with significant fatigue and sickness therefore goals were not reassessed today. Goals will be updated next time. Light exercises were completed in supine secondary to patient fatigue. Observed and measured wound on L heel and patient verbally agreed  to call wound care doctor. Patient will continue to benefit to improve strength, mobility, gait mechanics, and increase quality of life.     Clinical Impairments Affecting Rehab Potential  + good motivation, +performing HEP from previous home health -chronicity, body habitus     PT Frequency  1x / week    PT Duration  8 weeks    PT Treatment/Interventions  ADLs/Self Care Home Management;Cryotherapy;Ultrasound;Traction;Moist Heat;Electrical Stimulation;DME Instruction;Gait training;Stair training;Balance training;Therapeutic exercise;Therapeutic activities;Functional mobility training;Neuromuscular re-education;Patient/family education;Orthotic Fit/Training;Manual techniques;Wheelchair mobility training;Passive range of motion;Energy conservation;Taping    PT Next Visit Plan  ambulatory, LE strengthening.     PT Home Exercise Plan  added LAQ and assisted DF to HEP    Consulted and Agree with Plan of Care  Patient       Patient will benefit from skilled therapeutic intervention in order to improve the following deficits and impairments:  Abnormal gait, Decreased activity tolerance, Decreased balance, Decreased endurance, Decreased knowledge of precautions, Decreased mobility, Decreased strength, Difficulty walking, Impaired perceived functional  ability, Impaired sensation, Postural dysfunction, Improper body mechanics, Pain  Visit Diagnosis: Other abnormalities of gait and mobility  Difficulty in walking, not elsewhere classified  Muscle weakness (generalized)     Problem List Patient Active Problem List   Diagnosis Date Noted  . Sepsis ([place]) 04/02/2018  . 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 CKD (chronic kidney disease) stage 3, GFR 30-59 ml/min (HCC) 07/04/2017  . Class 3 drug-induced obesity with serious comorbidity and body mass index (BMI) of 45.0 to 49.9 in adult ([place]) 07/04/2017  . Chronic feet pain (Location of Primary Source of Pain) (Bilateral) (R>L) 04/25/2017  . Diabetic peripheral neuropathy (HCC) (Location of Primary Source of Pain) (Bilateral) (R>L) 04/25/2017  . Chronic lower extremity pain (Location of Secondary source of pain) (Bilateral) (R>L) 04/25/2017  . Chronic pain syndrome 04/25/2017  . Long term (current) use of opiate analgesic 04/25/2017  . Long term prescription opiate use 04/25/2017  . Opiate use 04/25/2017  . Chronic hand pain (Location of Tertiary source of pain) (Bilateral) (L>R) 04/25/2017  . Carpal tunnel syndrome (Bilateral) (L>R) 04/25/2017  . History of stroke 04/25/2017  . Infected decubitus ulcer, unstageable ([place]) 10/26/2016  . Closed fracture of humerus, surgical neck 05/31/2016  . Closed 3-part fracture of surgical neck of right humerus with delayed healing 05/31/2016  . Pain in shoulder 05/19/2016  . Edema leg 05/18/2016  . 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 Kidney lump 05/18/2016  . Bilateral edema of lower extremity 05/18/2016  . Bilateral leg weakness 05/18/2016  . Kidney mass 05/18/2016  . Pressure ulcer 04/24/2016  . History of DKA (diabetic ketoacidosis) ([place]) 04/23/2016    Class: History of  . Diabetes mellitus ([place]) 04/23/2016  . Other specified diabetes mellitus with ketoacidosis without coma ([place]) 04/23/2016   Erick Blinks, SPT  This entire session  was performed under direct supervision and direction of a licensed therapist/therapist assistant . I have personally read, edited and approve of the note as written.   Janna Arch, PT, DPT   10/02/2018, 10:13 AM  [place] MAIN [facility] SERVICES [address] [place], [place], [address] Phone: [phone]   Fax:  [phone]  Name: Oyuki Hogan Elsen MRN: [id number] Date of Birth: [birth year]

## 2018-10-09 ENCOUNTER — Ambulatory Visit: Payer: Medicare HMO

## 2018-10-09 DIAGNOSIS — R262 Difficulty in walking, not elsewhere classified: Secondary | ICD-10-CM

## 2018-10-09 DIAGNOSIS — M6281 Muscle weakness (generalized): Secondary | ICD-10-CM

## 2018-10-09 DIAGNOSIS — R2689 Other abnormalities of gait and mobility: Secondary | ICD-10-CM

## 2018-10-09 NOTE — Therapy (Addendum)
[place] MAIN [facility] SERVICES [address] [place], [place], [address] Phone: [phone]   Fax:  [phone]   Physical Therapy Progress Note   Dates of reporting period  08/13/18  to   10/09/18    Patient Details  Name: [name] MRN: [id number] Date of Birth: 05-Mar-1961 Referring Provider (PT): Dr. Willadean Carol   Encounter Date: 10/09/2018  PT End of Session - 10/09/18 1001    Visit Number  24    Number of Visits  32    Date for PT Re-Evaluation  10/08/18    Authorization Type  1/10 PN  (starting 10/15)     PT Start Time  0900    PT Stop Time  0945    PT Time Calculation (min)  45 min    Equipment Utilized During Treatment  Gait belt    Activity Tolerance  Patient tolerated treatment well;Patient limited by pain;Patient limited by fatigue    Behavior During Therapy  [facility] for tasks assessed/performed       Past Medical History:  Diagnosis Date  . Acute pain of right shoulder 05/19/2016  . Acute PN (pyelonephritis) 05/18/2016  . Acute pyelonephritis 05/18/2016  . Anxiety   . Arthritis   . Asthma   . Diabetes mellitus without complication ([place])   . GERD (gastroesophageal reflux disease)   . Glaucoma   . Hyperlipemia   . Hyperlipemia   . Hypertension     Past Surgical History:  Procedure Laterality Date  . ANTERIOR CRUCIATE LIGAMENT REPAIR      There were no vitals filed for this visit.  Subjective Assessment - 10/09/18 0913    Subjective  Patient reports flare up of her back a couple days ago and has increaesd pain while walking. States she followed up with wound care doctor and was advised to clean wound throughly and bandage, during day and unbandage at night.  Reports she has an appointment scheduled for next week.     Pertinent History  Patient presented in manual wheelchair with bilateral AFOs. Was seen by this therapist prior to admittance to hospital on 03/28/18 for sepsis.  April of 2017 patient came home  from work reported not feeling good, The next couple days was not feeling good. Went to doctor who called EMS to take her to the hospital. Was unable to get a number on her sugars initially due to patient not taking her medicine due to not feeling well and not eating. Sugar was read to be in the 700's, also found multiple strains of a UTI/sepsis while there. Was in ICU for 12 days at [facility] then went to [facility]. While at [facility] was not walked or gotten up so after the fourth months there she returned home with in home therapy. Has not had any physical therapy since October. Gets around house with walker ok or in her wheelchair. Has pressure ulcer on Right buttocks with pmh including GERD, DM type II, HTN, and polyneuropathy. Patient wants to get stronger to be able to walk and stand again for more independence and to help her mother.      Limitations  Lifting;Standing;Walking;House hold activities;Other (comment)    How long can you sit comfortably?  hour    How long can you stand comfortably?  stand 5 minutes with walker     How long can you walk comfortably?  10 ft    Patient Stated Goals  walk better, want to drive, stand for  longer, help her mom    Currently in Pain?  Yes    Pain Score  8     Pain Location  Back    Pain Orientation  Lower    Pain Descriptors / Indicators  Aching    Pain Type  Chronic pain    Multiple Pain Sites  Yes    Pain Score  7    Pain Location  Leg    Pain Orientation  Right    Pain Descriptors / Indicators  Burning    Pain Type  Neuropathic pain         5xsts-  From wheelchair pushed against plinth table using RW. 32 seconds with BUE 1 hand on RW and 1 hand on wheelchair. CGA.  LEFS- 29/80 81mt- 30 seconds; 0.326m with RW and CGA. Decreased hip extension, decreased weight shifts, decreased bilateral foot clearance.  ABC: 42.5%  Supine: Lower trunk rotations x4 minutes for pain relief and lumbar mobility TrA activation for pain relief and core activation  and strength   Patient educated about transition to silver sneakers or forever fit following discharge from therapy    Patient's condition has the potential to improve in response to therapy. Maximum improvement is yet to be obtained. The anticipated improvement is attainable and reasonable in a generally predictable time.  Patient reports she has noticed improvement since beginning therapy with being able to walk further distances.     PT Education - 10/09/18 1000    Education provided  Yes    Education Details  HEP, exercise technique, doctors follow up, transition after discharge    Person(s) Educated  Patient    Methods  Explanation;Demonstration;Verbal cues    Comprehension  Verbalized understanding;Returned demonstration       PT Short Term Goals - 10/09/18 0912      PT SHORT TERM GOAL #1   Title  Patient will be independent in home exercise program to improve strength/mobility for better functional independence with ADLs.    Baseline  HEP compliant    Time  2    Period  Weeks    Status  Achieved      PT SHORT TERM GOAL #2   Title   Patient (< 56ears old) will complete five times sit to stand test in < 20 seconds indicating an increased LE strength and improved balance    Baseline  5/8: 39 seconds with excessive UE support. 8/19: 22 seconds 10/15: 32seconds with UE support    Time  2    Period  Weeks    Status  Partially Met      PT SHORT TERM GOAL #3   Title  Patient will ambulate 50 ft without rest breaks to increase functional independence.     Baseline  ambulates 30 ft  6/26: 60 ft with one rest break with RW 8/19: walk 130 ft with RW and CGA and no rest breaks    Time  2    Period  Weeks    Status  Achieved      PT SHORT TERM GOAL #4   Title  Patient will increase BLE gross strength to 4-/5 as to improve functional strength for independent gait, increased standing tolerance and increased ADL ability.    Baseline  LLE 3/5 gross : 6/26: LLE 4-/5    Time  2     Period  Weeks    Status  Achieved        PT Long Term Goals - 10/09/18  0922      PT LONG TERM GOAL #1   Title  Patient will increase BLE gross strength to 4+/5 as to improve functional strength for independent gait, increased standing tolerance and increased ADL ability.    Baseline  3/6 and 5/8: LLE gross 3/5: 6/26: 4-/5 8/19: 4-/5 hip 4/5 knee 10/15: LLE gross 4-/5    Time  8    Period  Weeks    Status  Partially Met      PT LONG TERM GOAL #2   Title  Patient will increase lower extremity functional scale to >60/80 to demonstrate improved functional mobility and increased tolerance with ADLs.     Baseline  3/6: 24/80 5/8: 24/80 6/26: 33/80 8/18: 39/80 10/15: 29/80    Time  8    Period  Weeks    Status  Partially Met      PT LONG TERM GOAL #3   Title  Patient (< 27 years old) will complete five times sit to stand test in < 10 seconds indicating an increased LE strength and improved balance    Baseline  3/6: 46 seconds with UE support 5/8: 39 seconds with excessive UE support ; 6/26: 18 seconds with excessive UE support with one LOB ; 8/19: 22 seconds with decreased UE support no LOB 10/15: 32seconds with UE support    Time  8    Period  Weeks    Status  Partially Met      PT LONG TERM GOAL #4   Title  Patient will increase 10 meter walk test to >1.46ms as to improve gait speed for better community ambulation and to reduce fall risk    Baseline  .167m 6/26: .41 m/s 8/19: .41 m/s 10/15: 0.3366m   Time  8    Period  Weeks    Status  Partially Met      PT LONG TERM GOAL #5   Title  Patient will increase ABC scale score >80% to demonstrate better functional mobility and better confidence with ADLs.     Baseline  5/8: 8.4%; 6/26: 44% 8/19: 48.8% 10/15: 42.5%    Time  8    Period  Weeks    Status  Partially Met      PT LONG TERM GOAL #6   Title  Patient will ascend/descend a flight of stairs with Mod I with UE assistance to increase funcitonal mobility in family residencies.      Baseline  8/19: able to perform for 4 steps at most 10/15: did not perform    Time  8    Period  Weeks    Status  New            Plan - 10/09/18 1015    Clinical Impression Statement  Patient arrived to session with acute flair up of low back pain. Goals were updated with minimal progress made at this time likley due to current pain limitations impacting functional mobility and outcomes. Patient remains highly motivated to achieve personal goals with adeuqate compliance to therapy session and HEP. Patient still challenged with endurance based exercises in standing and LE weakness limiting her ability to ambulate in grocery store or in community. Patient's condition has the potential to improve in response to therapy. Maximum improvement is yet to be obtained. The anticipated improvement is attainable and reasonable in a generally predictable time. Patient will continue to benefit from skilled physical therapy to improve pain managment, strength, gait, endurance, and functional mobility to increase  quality of life.     Clinical Impairments Affecting Rehab Potential  + good motivation, +performing HEP from previous home health -chronicity, body habitus     PT Frequency  1x / week    PT Duration  8 weeks    PT Treatment/Interventions  ADLs/Self Care Home Management;Cryotherapy;Ultrasound;Traction;Moist Heat;Electrical Stimulation;DME Instruction;Gait training;Stair training;Balance training;Therapeutic exercise;Therapeutic activities;Functional mobility training;Neuromuscular re-education;Patient/family education;Orthotic Fit/Training;Manual techniques;Wheelchair mobility training;Passive range of motion;Energy conservation;Taping    PT Next Visit Plan  ambulatory, LE strengthening.     PT Home Exercise Plan  added LAQ and assisted DF to HEP    Consulted and Agree with Plan of Care  Patient       Patient will benefit from skilled therapeutic intervention in order to improve the following  deficits and impairments:  Abnormal gait, Decreased activity tolerance, Decreased balance, Decreased endurance, Decreased knowledge of precautions, Decreased mobility, Decreased strength, Difficulty walking, Impaired perceived functional ability, Impaired sensation, Postural dysfunction, Improper body mechanics, Pain  Visit Diagnosis: Other abnormalities of gait and mobility  Difficulty in walking, not elsewhere classified  Muscle weakness (generalized)     Problem List Patient Active Problem List   Diagnosis Date Noted  . Sepsis ([place]) 04/02/2018  . 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 CKD (chronic kidney disease) stage 3, GFR 30-59 ml/min (HCC) 07/04/2017  . Class 3 drug-induced obesity with serious comorbidity and body mass index (BMI) of 45.0 to 49.9 in adult ([place]) 07/04/2017  . Chronic feet pain (Location of Primary Source of Pain) (Bilateral) (R>L) 04/25/2017  . Diabetic peripheral neuropathy (HCC) (Location of Primary Source of Pain) (Bilateral) (R>L) 04/25/2017  . Chronic lower extremity pain (Location of Secondary source of pain) (Bilateral) (R>L) 04/25/2017  . Chronic pain syndrome 04/25/2017  . Long term (current) use of opiate analgesic 04/25/2017  . Long term prescription opiate use 04/25/2017  . Opiate use 04/25/2017  . Chronic hand pain (Location of Tertiary source of pain) (Bilateral) (L>R) 04/25/2017  . Carpal tunnel syndrome (Bilateral) (L>R) 04/25/2017  . History of stroke 04/25/2017  . Infected decubitus ulcer, unstageable ([place]) 10/26/2016  . Closed fracture of humerus, surgical neck 05/31/2016  . Closed 3-part fracture of surgical neck of right humerus with delayed healing 05/31/2016  . Pain in shoulder 05/19/2016  . Edema leg 05/18/2016  . 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 Kidney lump 05/18/2016  . Bilateral edema of lower extremity 05/18/2016  . Bilateral leg weakness 05/18/2016  . Kidney mass 05/18/2016  . Pressure ulcer 04/24/2016  . History  of DKA (diabetic ketoacidosis) ([place]) 04/23/2016    Class: History of  . Diabetes mellitus ([place]) 04/23/2016  . Other specified diabetes mellitus with ketoacidosis without coma ([place]) 04/23/2016   Erick Blinks, SPT 10/09/2018, 4:26 PM    This entire session was performed under direct supervision and direction of a licensed therapist/therapist assistant . I have personally read, edited and approve of the note as written.  Lieutenant Diego PT, DPT 4:27 PM,10/09/18 [phone]   [place] MAIN [facility] SERVICES [address] [place], [place], [address] Phone: [phone]   Fax:  [phone]  Name: Memori Sammon Swiney MRN: [id number] Date of Birth: 05-14-1961

## 2018-10-23 ENCOUNTER — Ambulatory Visit: Payer: Medicare HMO

## 2018-10-30 ENCOUNTER — Ambulatory Visit: Payer: Medicare HMO | Attending: Family Medicine

## 2018-10-30 DIAGNOSIS — R262 Difficulty in walking, not elsewhere classified: Secondary | ICD-10-CM | POA: Diagnosis present

## 2018-10-30 DIAGNOSIS — M6281 Muscle weakness (generalized): Secondary | ICD-10-CM | POA: Diagnosis present

## 2018-10-30 DIAGNOSIS — R2689 Other abnormalities of gait and mobility: Secondary | ICD-10-CM | POA: Insufficient documentation

## 2018-10-30 NOTE — Therapy (Addendum)
[place] MAIN [facility] SERVICES [address] [place], [place], [address] Phone: [phone]   Fax:  [phone]  Physical Therapy Treatment  Patient Details  Name: Nashonda Limberg Koelling MRN: [id number] Date of Birth: 04/25/1961 Referring Provider (PT): Dr. Willadean Carol   Encounter Date: 10/30/2018  PT End of Session - 10/30/18 0919    Visit Number  25    Number of Visits  32    Date for PT Re-Evaluation  12/03/18    Authorization Type  2/10 PN  (starting 10/15)     PT Start Time  0930    PT Stop Time  1015    PT Time Calculation (min)  45 min    Equipment Utilized During Treatment  Gait belt    Activity Tolerance  Patient tolerated treatment well;Patient limited by pain;Patient limited by fatigue    Behavior During Therapy  [facility] for tasks assessed/performed       Past Medical History:  Diagnosis Date  . Acute pain of right shoulder 05/19/2016  . Acute PN (pyelonephritis) 05/18/2016  . Acute pyelonephritis 05/18/2016  . Anxiety   . Arthritis   . Asthma   . Diabetes mellitus without complication ([place])   . GERD (gastroesophageal reflux disease)   . Glaucoma   . Hyperlipemia   . Hyperlipemia   . Hypertension     Past Surgical History:  Procedure Laterality Date  . ANTERIOR CRUCIATE LIGAMENT REPAIR      There were no vitals filed for this visit.  Subjective Assessment - 10/30/18 0932    Subjective  Patient returns from 3 week absence. Reports mom has been in [place] and had transportation issue last week. Reports she has been walking some. Reports L leg started to tremble while walking. States this started about a week ago. Reports follow up with doctor stating burning pain is neuropathy.  Reports wound on foot is not good and has appointment with wound care on Nov 20.     Pertinent History  Patient presented in manual wheelchair with bilateral AFOs. Was seen by this therapist prior to admittance to hospital on 03/28/18 for sepsis.  April of  2017 patient came home from work reported not feeling good, The next couple days was not feeling good. Went to doctor who called EMS to take her to the hospital. Was unable to get a number on her sugars initially due to patient not taking her medicine due to not feeling well and not eating. Sugar was read to be in the 700's, also found multiple strains of a UTI/sepsis while there. Was in ICU for 12 days at [facility] then went to [facility]. While at [facility] was not walked or gotten up so after the fourth months there she returned home with in home therapy. Has not had any physical therapy since October. Gets around house with walker ok or in her wheelchair. Has pressure ulcer on Right buttocks with pmh including GERD, DM type II, HTN, and polyneuropathy. Patient wants to get stronger to be able to walk and stand again for more independence and to help her mother.      Limitations  Lifting;Standing;Walking;House hold activities;Other (comment)    How long can you sit comfortably?  hour    How long can you stand comfortably?  stand 5 minutes with walker     How long can you walk comfortably?  10 ft    Patient Stated Goals  walk better, want to drive, stand for longer,  help her mom    Currently in Pain?  Yes    Pain Score  7     Pain Location  Leg    Pain Orientation  Right;Left    Pain Descriptors / Indicators  Burning;Tingling    Pain Type  Chronic pain    Multiple Pain Sites  No             Seated:             Marches BLE x20 with noted mild compensatory posterior trunk lean ; compensation corrected by utilizing forward trunk lean to increase core activation. ;                         Seated LAQ 15x 5 second holds, BLE ;                TrA seated activation 15x 5 second holds                Gluteal squeezes 20x  5 second holds                Hip adduction squeezes ball 15x 5 seconds .      Standing  Ambulate31f wPearl Riverfollow and CGA in hall.Cues for widening BOSand improved  weight shift. Decreased weight shift on LLE. Cued to decreased weight put through RW.   Standing step overs with orange hurdle. BUE support x 10 each leg. Verbal cues to increase step height.      Hamstring curls 10x each leg BUE support; per patient report LLE more difficult to bend and more fatiguing.    Hip abduction 10x each leg BUE support. Verbal cues to maintain LE alignment   6" step toe taps BUE support 10x each leg ; cues for maintaining weight shift   Pt requires long rest breaks between sets and between exercises due to quickness to fatigue and overheating; seated ice pack use.                          PT Education - 10/30/18 1017    Education provided  Yes    Education Details  HEP, exercise technique, walking    Person(s) Educated  Patient    Methods  Explanation;Demonstration;Verbal cues    Comprehension  Verbalized understanding;Returned demonstration       PT Short Term Goals - 10/09/18 0912      PT SHORT TERM GOAL #1   Title  Patient will be independent in home exercise program to improve strength/mobility for better functional independence with ADLs.    Baseline  HEP compliant    Time  2    Period  Weeks    Status  Achieved      PT SHORT TERM GOAL #2   Title   Patient (< 723years old) will complete five times sit to stand test in < 20 seconds indicating an increased LE strength and improved balance    Baseline  5/8: 39 seconds with excessive UE support. 8/19: 22 seconds 10/15: 32seconds with UE support    Time  2    Period  Weeks    Status  Partially Met      PT SHORT TERM GOAL #3   Title  Patient will ambulate 50 ft without rest breaks to increase functional independence.     Baseline  ambulates 30 ft  6/26: 60 ft with one rest break with RW 8/19:  walk 130 ft with RW and CGA and no rest breaks    Time  2    Period  Weeks    Status  Achieved      PT SHORT TERM GOAL #4   Title  Patient will increase BLE gross strength to 4-/5 as to  improve functional strength for independent gait, increased standing tolerance and increased ADL ability.    Baseline  LLE 3/5 gross : 6/26: LLE 4-/5    Time  2    Period  Weeks    Status  Achieved        PT Long Term Goals - 10/09/18 9563      PT LONG TERM GOAL #1   Title  Patient will increase BLE gross strength to 4+/5 as to improve functional strength for independent gait, increased standing tolerance and increased ADL ability.    Baseline  3/6 and 5/8: LLE gross 3/5: 6/26: 4-/5 8/19: 4-/5 hip 4/5 knee 10/15: LLE gross 4-/5    Time  8    Period  Weeks    Status  Partially Met    Target Date  12/04/18      PT LONG TERM GOAL #2   Title  Patient will increase lower extremity functional scale to >60/80 to demonstrate improved functional mobility and increased tolerance with ADLs.     Baseline  3/6: 24/80 5/8: 24/80 6/26: 33/80 8/18: 39/80 10/15: 29/80    Time  8    Period  Weeks    Status  Partially Met    Target Date  12/04/18      PT LONG TERM GOAL #3   Title  Patient (< 74 years old) will complete five times sit to stand test in < 10 seconds indicating an increased LE strength and improved balance    Baseline  3/6: 46 seconds with UE support 5/8: 39 seconds with excessive UE support ; 6/26: 18 seconds with excessive UE support with one LOB ; 8/19: 22 seconds with decreased UE support no LOB 10/15: 32seconds with UE support    Time  8    Period  Weeks    Status  Partially Met    Target Date  12/04/18      PT LONG TERM GOAL #4   Title  Patient will increase 10 meter walk test to >1.106ms as to improve gait speed for better community ambulation and to reduce fall risk    Baseline  .18m 6/26: .41 m/s 8/19: .41 m/s 10/15: 0.332m   Time  8    Period  Weeks    Status  Partially Met    Target Date  12/04/18      PT LONG TERM GOAL #5   Title  Patient will increase ABC scale score >80% to demonstrate better functional mobility and better confidence with ADLs.     Baseline   5/8: 8.4%; 6/26: 44% 8/19: 48.8% 10/15: 42.5%    Time  8    Period  Weeks    Status  Partially Met    Target Date  12/04/18      PT LONG TERM GOAL #6   Title  Patient will ascend/descend a flight of stairs with Mod I with UE assistance to increase funcitonal mobility in family residencies.     Baseline  8/19: able to perform for 4 steps at most 10/15: did not perform    Time  8    Period  Weeks    Status  New  Target Date  12/04/18            Plan - 10/30/18 1020    Clinical Impression Statement  Patient returns to therapy after 3 week absence. Patient ambulated with decreased weight shift on LLE possibly due to LLE weakness and wound on L heel leading to changes in gait mechanics. Patient had difficulty with step overs (L harder than R) due to LE weakness and decreased foot clearance. Exercises were intermingled between standing and sitting due to limited functional capacity. Patient had noticeable LE fatigue at end of session.Patient will continue to benefit from skilled physical therapy to improve strength, mobility, gait, and increased quality of life.     Clinical Impairments Affecting Rehab Potential  + good motivation, +performing HEP from previous home health -chronicity, body habitus     PT Frequency  1x / week    PT Duration  8 weeks    PT Treatment/Interventions  ADLs/Self Care Home Management;Cryotherapy;Ultrasound;Traction;Moist Heat;Electrical Stimulation;DME Instruction;Gait training;Stair training;Balance training;Therapeutic exercise;Therapeutic activities;Functional mobility training;Neuromuscular re-education;Patient/family education;Orthotic Fit/Training;Manual techniques;Wheelchair mobility training;Passive range of motion;Energy conservation;Taping    PT Next Visit Plan  ambulatory, LE strengthening.     PT Home Exercise Plan  added LAQ and assisted DF to HEP    Consulted and Agree with Plan of Care  Patient       Patient will benefit from skilled therapeutic  intervention in order to improve the following deficits and impairments:  Abnormal gait, Decreased activity tolerance, Decreased balance, Decreased endurance, Decreased knowledge of precautions, Decreased mobility, Decreased strength, Difficulty walking, Impaired perceived functional ability, Impaired sensation, Postural dysfunction, Improper body mechanics, Pain  Visit Diagnosis: Other abnormalities of gait and mobility  Difficulty in walking, not elsewhere classified  Muscle weakness (generalized)     Problem List Patient Active Problem List   Diagnosis Date Noted  . Sepsis ([place]) 04/02/2018  . 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 CKD (chronic kidney disease) stage 3, GFR 30-59 ml/min (HCC) 07/04/2017  . Class 3 drug-induced obesity with serious comorbidity and body mass index (BMI) of 45.0 to 49.9 in adult ([place]) 07/04/2017  . Chronic feet pain (Location of Primary Source of Pain) (Bilateral) (R>L) 04/25/2017  . Diabetic peripheral neuropathy (HCC) (Location of Primary Source of Pain) (Bilateral) (R>L) 04/25/2017  . Chronic lower extremity pain (Location of Secondary source of pain) (Bilateral) (R>L) 04/25/2017  . Chronic pain syndrome 04/25/2017  . Long term (current) use of opiate analgesic 04/25/2017  . Long term prescription opiate use 04/25/2017  . Opiate use 04/25/2017  . Chronic hand pain (Location of Tertiary source of pain) (Bilateral) (L>R) 04/25/2017  . Carpal tunnel syndrome (Bilateral) (L>R) 04/25/2017  . History of stroke 04/25/2017  . Infected decubitus ulcer, unstageable ([place]) 10/26/2016  . Closed fracture of humerus, surgical neck 05/31/2016  . Closed 3-part fracture of surgical neck of right humerus with delayed healing 05/31/2016  . Pain in shoulder 05/19/2016  . Edema leg 05/18/2016  . 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 Kidney lump 05/18/2016  . Bilateral edema of lower extremity 05/18/2016  . Bilateral leg weakness 05/18/2016  . Kidney mass  05/18/2016  . Pressure ulcer 04/24/2016  . History of DKA (diabetic ketoacidosis) ([place]) 04/23/2016    Class: History of  . Diabetes mellitus ([place]) 04/23/2016  . Other specified diabetes mellitus with ketoacidosis without coma ([place]) 04/23/2016   Erick Blinks, SPT  This entire session was performed under direct supervision and direction of a licensed therapist/therapist assistant . I have personally read, edited and  approve of the note as written.  Janna Arch, PT, DPT   10/30/2018, 11:04 AM  [place] MAIN [facility] SERVICES [address] [place], [place], [address] Phone: [phone]   Fax:  [phone]  Name: Shevonne Wolf Irby MRN: [id number] Date of Birth: September 27, 1961

## 2018-11-06 ENCOUNTER — Ambulatory Visit: Payer: Medicare HMO

## 2018-11-13 ENCOUNTER — Ambulatory Visit: Payer: Self-pay

## 2018-11-14 ENCOUNTER — Encounter: Payer: Medicare HMO | Attending: Internal Medicine | Admitting: Internal Medicine

## 2018-11-14 DIAGNOSIS — E1151 Type 2 diabetes mellitus with diabetic peripheral angiopathy without gangrene: Secondary | ICD-10-CM | POA: Diagnosis not present

## 2018-11-14 DIAGNOSIS — E11621 Type 2 diabetes mellitus with foot ulcer: Secondary | ICD-10-CM | POA: Insufficient documentation

## 2018-11-14 DIAGNOSIS — I10 Essential (primary) hypertension: Secondary | ICD-10-CM | POA: Diagnosis not present

## 2018-11-14 DIAGNOSIS — E1142 Type 2 diabetes mellitus with diabetic polyneuropathy: Secondary | ICD-10-CM | POA: Diagnosis not present

## 2018-11-14 DIAGNOSIS — Z794 Long term (current) use of insulin: Secondary | ICD-10-CM | POA: Insufficient documentation

## 2018-11-14 DIAGNOSIS — L97422 Non-pressure chronic ulcer of left heel and midfoot with fat layer exposed: Secondary | ICD-10-CM | POA: Diagnosis not present

## 2018-11-14 DIAGNOSIS — J449 Chronic obstructive pulmonary disease, unspecified: Secondary | ICD-10-CM | POA: Diagnosis not present

## 2018-11-14 DIAGNOSIS — Z87891 Personal history of nicotine dependence: Secondary | ICD-10-CM | POA: Insufficient documentation

## 2018-11-15 ENCOUNTER — Ambulatory Visit: Payer: Medicare HMO

## 2018-11-16 ENCOUNTER — Other Ambulatory Visit: Payer: Self-pay | Admitting: Internal Medicine

## 2018-11-16 ENCOUNTER — Other Ambulatory Visit (HOSPITAL_BASED_OUTPATIENT_CLINIC_OR_DEPARTMENT_OTHER): Payer: Self-pay | Admitting: Internal Medicine

## 2018-11-16 DIAGNOSIS — S91301A Unspecified open wound, right foot, initial encounter: Secondary | ICD-10-CM

## 2018-11-16 DIAGNOSIS — S91302A Unspecified open wound, left foot, initial encounter: Secondary | ICD-10-CM

## 2018-11-16 NOTE — Progress Notes (Signed)
SIRI, BUEGE ([id number]) Visit Report for 11/14/2018 Abuse/Suicide Risk Screen Details Patient Name: [name], [name] Date of Service: 11/14/2018 8:45 AM Medical Record Number: [id number] Patient Account Number: [id number] Date of Birth/Sex: September 02, 1961 (57 y.o. F) Treating RN: Rema Jasmine Primary Care Rayhana Slider: Joen [name] Other Clinician: Referring Kevron Patella: Joen [name] Treating Aerik Polan/Extender: [employer] [place] in Treatment: 0 Abuse/Suicide Risk Screen Items Answer ABUSE/SUICIDE RISK SCREEN: Has anyone close to you tried to hurt or harm you recentlyo No Do you feel uncomfortable with anyone in your familyo No Has anyone forced you do things that you didnot want to doo No Do you have any thoughts of harming yourselfo No Patient displays signs or symptoms of abuse and/or neglect. No Electronic Signature(s) Signed: 11/14/2018 4:20:57 PM By: Rema Jasmine Entered By: Rema Jasmine on 11/14/2018 09:16:52 Detwiler, [name] ([id number]) -------------------------------------------------------------------------------- Activities of Daily Living Details Patient Name: [name] Date of Service: 11/14/2018 8:45 AM Medical Record Number: [id number] Patient Account Number: [id number] Date of Birth/Sex: 05-Jul-1961 (57 y.o. F) Treating RN: Rema Jasmine Primary Care Jacqueli Pangallo: Joen [name] Other Clinician: Referring Ravonda Brecheen: Joen [name] Treating Kyilee Gregg/Extender: [employer] [place] in Treatment: 0 Activities of Daily Living Items Answer Activities of Daily Living (Please select one for each item) Drive Automobile Not Able Take Medications Completely Able Use Telephone Completely Able Care for Appearance Completely Able Use Toilet Completely Able Bath / Shower Completely Able Dress Self Completely Able Feed Self Completely Able Walk Completely Able Get In / Out Bed Completely Able Housework Completely Able Prepare Meals Completely Able Handle Money Completely Able Shop for Self  Completely Able Electronic Signature(s) Signed: 11/14/2018 4:20:57 PM By: Rema Jasmine Entered By: Rema Jasmine on 11/14/2018 09:17:38 Wailes, [name] ([id number]) -------------------------------------------------------------------------------- Education Assessment Details Patient Name: [name] Date of Service: 11/14/2018 8:45 AM Medical Record Number: [id number] Patient Account Number: [id number] Date of Birth/Sex: 11-13-1961 (57 y.o. F) Treating RN: Rema Jasmine Primary Care Pooja Camuso: Joen [name] Other Clinician: Referring Dorthey Depace: Joen [name] Treating Mohanad Carsten/Extender: [employer] [place] in Treatment: 0 Learning Preferences/Education Level/Primary Language Learning Preference: Explanation, Demonstration Cognitive Barrier Assessment/Beliefs Language Barrier: No Translator Needed: No Memory Deficit: No Emotional Barrier: No Cultural/Religious Beliefs Affecting Medical Care: No Physical Barrier Assessment Impaired Vision: Yes Glasses Impaired Hearing: No Decreased Hand dexterity: No Knowledge/Comprehension Assessment Knowledge Level: High Comprehension Level: High Ability to understand written High instructions: Ability to understand verbal High instructions: Motivation Assessment Anxiety Level: Calm Cooperation: Cooperative Education Importance: Acknowledges Need Interest in Health Problems: Asks Questions Perception: Coherent Willingness to Engage in Self- High Management Activities: Readiness to Engage in Self- High Management Activities: Electronic Signature(s) Signed: 11/14/2018 4:20:57 PM By: Rema Jasmine Entered By: Rema Jasmine on 11/14/2018 09:18:20 Umeda, [name] ([id number]) -------------------------------------------------------------------------------- Fall Risk Assessment Details Patient Name: [name] Date of Service: 11/14/2018 8:45 AM Medical Record Number: [id number] Patient Account Number: [id number] Date of Birth/Sex: March 27, 1961 (57 y.o.  F) Treating RN: Rema Jasmine Primary Care Bethene Hankinson: Joen [name] Other Clinician: Referring Adelae Yodice: Joen [name] Treating Naidelin Gugliotta/Extender: [employer] [place] in Treatment: 0 Fall Risk Assessment Items Have you had 2 or more falls in the last 12 monthso 0 No Have you had any fall that resulted in injury in the last 12 monthso 0 No FALL RISK ASSESSMENT: History of falling - immediate or within 3 months 0 No Secondary diagnosis 0 No Ambulatory aid None/bed rest/wheelchair/nurse 0 No Crutches/cane/walker 0 No Furniture 0 No IV Access/Saline Lock 0 No Gait/Training Normal/bed rest/immobile 0 No  Weak 0 No Impaired 0 No Mental Status Oriented to own ability 0 No Electronic Signature(s) Signed: 11/14/2018 4:20:57 PM By: Rema Jasmine Entered By: Rema Jasmine on 11/14/2018 09:19:03 Hougland, [name] ([id number]) -------------------------------------------------------------------------------- Foot Assessment Details Patient Name: [name] Date of Service: 11/14/2018 8:45 AM Medical Record Number: [id number] Patient Account Number: [id number] Date of Birth/Sex: 17-Nov-1961 (57 y.o. F) Treating RN: Rema Jasmine Primary Care Lasheika Ortloff: Joen [name] Other Clinician: Referring Orlandria Kissner: Joen [name] Treating Coree Brame/Extender: [employer] [place] in Treatment: 0 Foot Assessment Items Site Locations + = Sensation present, - = Sensation absent, C = Callus, U = Ulcer R = Redness, W = Warmth, M = Maceration, PU = Pre-ulcerative lesion F = Fissure, S = Swelling, D = Dryness Assessment Right: Left: Other Deformity: No No Prior Foot Ulcer: No No Prior Amputation: No No Charcot Joint: No No Ambulatory Status: Gait: Electronic Signature(s) Signed: 11/14/2018 4:20:57 PM By: Rema Jasmine Entered By: Rema Jasmine on 11/14/2018 09:19:35 Krejci, [name] ([id number]) -------------------------------------------------------------------------------- Nutrition Risk Assessment Details Patient Name: [name] Date of Service: 11/14/2018 8:45 AM Medical Record Number: [id number] Patient Account Number: [id number] Date of Birth/Sex: [birth year] (57 y.o. F) Treating RN: Rema Jasmine Primary Care Brack Shaddock: Joen [name] Other Clinician: Referring Mamoru Takeshita: Joen [name] Treating Alanya Vukelich/Extender: [employer] [place] in Treatment: 0 Height (in): Weight (lbs): Body Mass Index (BMI): Nutrition Risk Assessment Items NUTRITION RISK SCREEN: I have an illness or condition that made me change the kind and/or amount of 0 No food I eat I eat fewer than two meals per day 0 No I eat few fruits and vegetables, or milk products 0 No I have three or more drinks of beer, liquor or wine almost every day 0 No I have tooth or mouth problems that make it hard for me to eat 0 No I don't always have enough money to buy the food I need 0 No I eat alone most of the time 0 No I take three or more different prescribed or over-the-counter drugs a day 0 No Without wanting to, I have lost or gained 10 pounds in the last six months 0 No I am not always physically able to shop, cook and/or feed myself 0 No Nutrition Protocols Good Risk Protocol Moderate Risk Protocol Electronic Signature(s) Signed: 11/14/2018 4:20:57 PM By: Rema Jasmine Entered ByRema Jasmine on 11/14/2018 09:19:08

## 2018-11-16 NOTE — Progress Notes (Signed)
LORIANN, WESSLER ([id number]) Visit Report for 11/14/2018 Allergy List Details Patient Name: MERIT, ALMEDA Date of Service: 11/14/2018 8:45 AM Medical Record Number: [id number] Patient Account Number: [id number] Date of Birth/Sex: April 21, 1961 (57 y.o. F) Treating RN: Rema Jasmine Primary Care Shayonna Ocampo: Joen [name] Other Clinician: Referring Nicie Milan: Joen [name] Treating Phillippe Orlick/Extender: Maxwell Caul Weeks in Treatment: 0 Allergies Active Allergies Biaxin Allergy Notes Electronic Signature(s) Signed: 11/14/2018 4:20:57 PM By: Rema Jasmine Entered By: Rema Jasmine on 11/14/2018 09:13:31 Kue, [name] ([id number]) -------------------------------------------------------------------------------- Arrival Information Details Patient Name: [name] Date of Service: 11/14/2018 8:45 AM Medical Record Number: [id number] Patient Account Number: [id number] Date of Birth/Sex: [birth year] (57 y.o. F) Treating RN: Rema Jasmine Primary Care Geneveive Furness: Joen [name] Other Clinician: Referring Kalai Baca: Joen [name] Treating Maude Hettich/Extender: Altamese [place] in Treatment: 0 Visit Information Patient Arrived: Wheel Chair Arrival Time: 08:49 Accompanied By: self Transfer Assistance: None Patient Identification Verified: Yes Secondary Verification Process Completed: Yes History Since Last Visit Added or deleted any medications: No Any new allergies or adverse reactions: No Had a fall or experienced change in activities of daily living that may affect risk of falls: No Signs or symptoms of abuse/neglect since last visito No Hospitalized since last visit: No Implantable device outside of the clinic excluding cellular tissue based products placed in the center since last visit: No Electronic Signature(s) Signed: 11/14/2018 4:20:57 PM By: Rema Jasmine Entered By: Rema Jasmine on 11/14/2018 08:56:03 Essner, [name]  ([id number]) -------------------------------------------------------------------------------- Clinic Level of Care Assessment Details Patient Name: [name] Date of Service: 11/14/2018 8:45 AM Medical Record Number: [id number] Patient Account Number: [id number] Date of Birth/Sex: 06/15/1961 (57 y.o. F) Treating RN: Huel Coventry Primary Care Milady Fleener: Joen [name] Other Clinician: Referring Mukund Weinreb: Joen [name] Treating Dyasia Firestine/Extender: Altamese [place] in Treatment: 0 Clinic Level of Care Assessment Items TOOL 1 Quantity Score []  - Use when EandM and Procedure is performed on INITIAL visit 0 ASSESSMENTS - Nursing Assessment / Reassessment X - General Physical Exam (combine w/ comprehensive assessment (listed just below) when 1 20 performed on new pt. evals) X- 1 25 Comprehensive Assessment (HX, ROS, Risk Assessments, Wounds Hx, etc.) ASSESSMENTS - Wound and Skin Assessment / Reassessment []  - Dermatologic / Skin Assessment (not related to wound area) 0 ASSESSMENTS - Ostomy and/or Continence Assessment and Care []  - Incontinence Assessment and Management 0 []  - 0 Ostomy Care Assessment and Management (repouching, etc.) PROCESS - Coordination of Care X - Simple Patient / Family Education for ongoing care 1 15 []  - 0 Complex (extensive) Patient / Family Education for ongoing care []  - 0 Staff obtains Chiropractor, Records, Test Results / Process Orders []  - 0 Staff telephones HHA, Nursing Homes / Clarify orders / etc []  - 0 Routine Transfer to another Facility (non-emergent condition) []  - 0 Routine Hospital Admission (non-emergent condition) X- 1 15 New Admissions / Manufacturing engineer / Ordering NPWT, Apligraf, etc. []  - 0 Emergency Hospital Admission (emergent condition) PROCESS - Special Needs []  - Pediatric / Minor Patient Management 0 []  - 0 Isolation Patient Management []  - 0 Hearing / Language / Visual special needs []  - 0 Assessment of Community  assistance (transportation, D/C planning, etc.) []  - 0 Additional assistance / Altered mentation []  - 0 Support Surface(s) Assessment (bed, cushion, seat, etc.) Gehlhausen, Venba L. ([id number]) INTERVENTIONS - Miscellaneous []  - External ear exam 0 []  - 0 Patient Transfer (multiple staff / Nurse, adult / Similar devices) []  - 0 Simple Staple /  Suture removal (25 or less) []  - 0 Complex Staple / Suture removal (26 or more) []  - 0 Hypo/Hyperglycemic Management (do not check if billed separately) X- 1 15 Ankle / Brachial Index (ABI) - do not check if billed separately Has the patient been seen at the hospital within the last three years: Yes Total Score: 90 Level Of Care: New/Established - Level 3 Electronic Signature(s) Signed: 11/14/2018 5:21:06 PM By: Elliot Gurney, BSN, RN, CWS, Kim RN, BSN Entered By: Elliot Gurney, BSN, RN, CWS, Kim on 11/14/2018 09:39:51 Levier, [name] ([id number]) -------------------------------------------------------------------------------- Encounter Discharge Information Details Patient Name: [name] Date of Service: 11/14/2018 8:45 AM Medical Record Number: [id number] Patient Account Number: [id number] Date of Birth/Sex: 1961/08/21 (57 y.o. F) Treating RN: Huel Coventry Primary Care Graceanne Guin: Joen [name] Other Clinician: Referring Lylliana Kitamura: Joen [name] Treating Chayil Gantt/Extender: Altamese [place] in Treatment: 0 Encounter Discharge Information Items Post Procedure Vitals Discharge Condition: Stable Temperature (F): 99.0 Ambulatory Status: Wheelchair Pulse (bpm): 103 Discharge Destination: Home Respiratory Rate (breaths/min): 20 Transportation: Private Auto Blood Pressure (mmHg): 136/68 Accompanied By: self Schedule Follow-up Appointment: Yes Clinical Summary of Care: Patient Declined Electronic Signature(s) Signed: 11/14/2018 5:21:06 PM By: Elliot Gurney, BSN, RN, CWS, Kim RN, BSN Entered By: Elliot Gurney, BSN, RN, CWS, Kim on 11/14/2018 10:08:06 Cooner, [name]  ([id number]) -------------------------------------------------------------------------------- Lower Extremity Assessment Details Patient Name: [name] Date of Service: 11/14/2018 8:45 AM Medical Record Number: [id number] Patient Account Number: [id number] Date of Birth/Sex: [birth year] (57 y.o. F) Treating RN: Rema Jasmine Primary Care Deidrea Gaetz: Joen [name] Other Clinician: Referring Glorimar Stroope: Joen [name] Treating Trevelle Mcgurn/Extender: Altamese [place] in Treatment: 0 Edema Assessment Assessed: [Left: No] [Right: No] Edema: [Left: No] [Right: No] Calf Left: Right: Point of Measurement: 30 cm From Medial Instep 35 cm 37 cm Ankle Left: Right: Point of Measurement: 12 cm From Medial Instep 22 cm 23 cm Vascular Assessment Pulses: Dorsalis Pedis Palpable: [Left:No] [Right:No] Posterior Tibial Extremity colors, hair growth, and conditions: Extremity Color: [Left:Normal] [Right:Normal] Hair Growth on Extremity: [Left:Yes] [Right:Yes] Temperature of Extremity: [Left:Cool] [Right:Cool] Capillary Refill: [Left:< 3 seconds] [Right:< 3 seconds] Blood Pressure: Dorsalis Pedis: [Left:Dorsalis Pedis:] Ankle: Posterior Tibial: 120 [Left:Posterior Tibial:] Toe Nail Assessment Left: Right: Thick: No No Discolored: No No Deformed: No No Improper Length and Hygiene: No No Electronic Signature(s) Signed: 11/14/2018 4:20:57 PM By: Rema Jasmine Entered By: Rema Jasmine on 11/14/2018 09:22:35 Risk, Ersel Elbert Ewings ([id number]) -------------------------------------------------------------------------------- Multi Wound Chart Details Patient Name: [name] Date of Service: 11/14/2018 8:45 AM Medical Record Number: [id number] Patient Account Number: [id number] Date of Birth/Sex: 01/17/1961 (57 y.o. F) Treating RN: Rema Jasmine Primary Care Damir Leung: Joen [name] Other Clinician: Referring Raheim Beutler: Joen [name] Treating Zaim Nitta/Extender: Altamese [place] in Treatment: 0 Vital  Signs Height(in): Pulse(bpm): 103 Weight(lbs): Blood Pressure(mmHg): 136/68 Body Mass Index(BMI): Temperature(F): 99.0 Respiratory Rate 20 (breaths/min): Photos: [N/A:N/A] Wound Location: Left Foot - Lateral N/A N/A Wounding Event: Trauma N/A N/A Primary Etiology: To be determined N/A N/A Date Acquired: 09/04/2018 N/A N/A Weeks of Treatment: 0 N/A N/A Wound Status: Open N/A N/A Measurements L x W x D 2.2x2x0.3 N/A N/A (cm) Area (cm) : 3.456 N/A N/A Volume (cm) : 1.037 N/A N/A Starting Position 1 9 (o'clock): Ending Position 1 11 (o'clock): Maximum Distance 1 (cm): 0.4 Undermining: Yes N/A N/A Classification: Full Thickness Without N/A N/A Exposed Support Structures Exudate Amount: None Present N/A N/A Granulation Amount: None Present (0%) N/A N/A Necrotic Amount: Large (67-100%) N/A N/A Necrotic Tissue: Eschar N/A N/A Debridement: Debridement -  Excisional N/A N/A Pre-procedure 09:33 N/A N/A Verification/Time Out Taken: Pain Control: Lidocaine N/A N/A Tissue Debrided: Necrotic/Eschar, N/A N/A Subcutaneous Level: Skin/Subcutaneous Tissue N/A N/A Fann, Gabriellah L. ([id number]) Debridement Area (sq cm): 4.4 N/A N/A Instrument: Curette N/A N/A Bleeding: Minimum N/A N/A Hemostasis Achieved: Pressure N/A N/A Procedural Pain: Insensate N/A N/A Post Procedural Pain: 0 N/A N/A Debridement Treatment Procedure was tolerated well N/A N/A Response: Post Debridement 2.2x2x0.9 N/A N/A Measurements L x W x D (cm) Post Debridement Volume: 3.11 N/A N/A (cm) Periwound Skin Texture: Callus: Yes N/A N/A Excoriation: No Induration: No Crepitus: No Rash: No Scarring: No Periwound Skin Moisture: Maceration: No N/A N/A Dry/Scaly: No Periwound Skin Color: Atrophie Blanche: No N/A N/A Cyanosis: No Ecchymosis: No Erythema: No Hemosiderin Staining: No Mottled: No Pallor: No Rubor: No Tenderness on Palpation: No N/A N/A Wound Preparation: Ulcer Cleansing: N/A  N/A Rinsed/Irrigated with Saline Topical Anesthetic Applied: Other: lidocaine 4% Procedures Performed: Debridement N/A N/A Treatment Notes Electronic Signature(s) Signed: 11/14/2018 5:31:58 PM By: Baltazar Najjar MD Entered By: Baltazar Najjar on 11/14/2018 09:45:23 Akerson, [name] ([id number]) -------------------------------------------------------------------------------- Multi-Disciplinary Care Plan Details Patient Name: [name] Date of Service: 11/14/2018 8:45 AM Medical Record Number: [id number] Patient Account Number: [id number] Date of Birth/Sex: 1961/04/08 (57 y.o. F) Treating RN: Rema Jasmine Primary Care Corynn Solberg: Joen [name] Other Clinician: Referring Ariannah Arenson: Joen [name] Treating Lorisa Scheid/Extender: Altamese [place] in Treatment: 0 Active Inactive ` Necrotic Tissue Nursing Diagnoses: Impaired tissue integrity related to necrotic/devitalized tissue Goals: Necrotic/devitalized tissue will be minimized in the wound bed Date Initiated: 11/14/2018 Target Resolution Date: 11/28/2018 Goal Status: Active Patient/caregiver will verbalize understanding of reason and process for debridement of necrotic tissue Date Initiated: 11/14/2018 Target Resolution Date: 11/28/2018 Goal Status: Active Interventions: Assess patient pain level pre-, during and post procedure and prior to discharge Provide education on necrotic tissue and debridement process Treatment Activities: Apply topical anesthetic as ordered : 11/14/2018 Notes: ` Orientation to the Wound Care Program Nursing Diagnoses: Knowledge deficit related to the wound healing center program Goals: Patient/caregiver will verbalize understanding of the Wound Healing Center Program Date Initiated: 11/14/2018 Target Resolution Date: 11/28/2018 Goal Status: Active Interventions: Provide education on orientation to the wound center Notes: ` Pain, Acute or Chronic Nursing Diagnoses: Pain Management - Non-cyclic  Chronic Pain Lehan, Graciela L. ([id number]) Goals: Patient/caregiver will verbalize adequate pain control between visits Date Initiated: 11/14/2018 Target Resolution Date: 11/28/2018 Goal Status: Active Interventions: Complete pain assessment as per visit requirements Notes: ` Pressure Nursing Diagnoses: Knowledge deficit related to management of pressures ulcers Goals: Patient/caregiver will verbalize understanding of pressure ulcer management Date Initiated: 11/14/2018 Target Resolution Date: 11/28/2018 Goal Status: Active Interventions: Assess: immobility, friction, shearing, incontinence upon admission and as needed Notes: ` Wound/Skin Impairment Nursing Diagnoses: Impaired tissue integrity Goals: Ulcer/skin breakdown will have a volume reduction of 80% by week 12 Date Initiated: 11/14/2018 Target Resolution Date: 02/14/2019 Goal Status: Active Interventions: Assess ulceration(s) every visit Treatment Activities: Skin care regimen initiated : 11/14/2018 Notes: Electronic Signature(s) Signed: 11/14/2018 4:20:57 PM By: Rema Jasmine Signed: 11/14/2018 5:21:06 PM By: Elliot Gurney, BSN, RN, CWS, Kim RN, BSN Entered By: Elliot Gurney, BSN, RN, CWS, Kim on 11/14/2018 09:32:24 Younce, [name] ([id number]) -------------------------------------------------------------------------------- Pain Assessment Details Patient Name: [name] Date of Service: 11/14/2018 8:45 AM Medical Record Number: [id number] Patient Account Number: [id number] Date of Birth/Sex: 08/14/1961 (57 y.o. F) Treating RN: Rema Jasmine Primary Care Lynnelle Mesmer: Joen [name] Other Clinician: Referring Mercie Balsley: Joen [name] Treating Chon Buhl/Extender: Maxwell Caul  Weeks in Treatment: 0 Active Problems Location of Pain Severity and Description of Pain Patient Has Paino No Site Locations Pain Management and Medication Current Pain Management: Goals for Pain Management pt denies any pain at this time. Electronic  Signature(s) Signed: 11/14/2018 4:20:57 PM By: Rema Jasmine Entered By: Rema Jasmine on 11/14/2018 08:56:50 Toback, [name] ([id number]) -------------------------------------------------------------------------------- Patient/Caregiver Education Details Patient Name: [name] Date of Service: 11/14/2018 8:45 AM Medical Record Number: [id number] Patient Account Number: [id number] Date of Birth/Gender: 01/07/1961 (57 y.o. F) Treating RN: Huel Coventry Primary Care Physician: Joen [name] Other Clinician: Referring Physician: Joen [name] Treating Physician/Extender: Altamese [place] in Treatment: 0 Education Assessment Education Provided To: Patient Education Topics Provided Pressure: Handouts: Pressure Ulcers: Care and Offloading Methods: Demonstration, Explain/Verbal Responses: State content correctly Wound Debridement: Handouts: Wound Debridement Methods: Explain/Verbal Responses: State content correctly Electronic Signature(s) Signed: 11/14/2018 5:21:06 PM By: Elliot Gurney, BSN, RN, CWS, Kim RN, BSN Entered By: Elliot Gurney, BSN, RN, CWS, Kim on 11/14/2018 10:08:11 Jacinto, [name] ([id number]) -------------------------------------------------------------------------------- Wound Assessment Details Patient Name: [name] Date of Service: 11/14/2018 8:45 AM Medical Record Number: [id number] Patient Account Number: [id number] Date of Birth/Sex: 1961/03/22 (57 y.o. F) Treating RN: Rema Jasmine Primary Care Amulya Quintin: Joen [name] Other Clinician: Referring Jude Naclerio: Joen [name] Treating Miya Luviano/Extender: Maxwell Caul Weeks in Treatment: 0 Wound Status Wound Number: 6 Primary Etiology: To be determined Wound Location: Left Foot - Lateral Wound Status: Open Wounding Event: Trauma Date Acquired: 09/04/2018 Weeks Of Treatment: 0 Clustered Wound: No Photos Photo Uploaded By: Rema Jasmine on 11/14/2018 09:40:25 Wound Measurements Length: (cm) 2.2 Width: (cm) 2 Depth: (cm)  0.3 Area: (cm) 3.456 Volume: (cm) 1.037 % Reduction in Area: % Reduction in Volume: Tunneling: No Undermining: Yes Starting Position (o'clock): 9 Ending Position (o'clock): 11 Maximum Distance: (cm) 0.4 Wound Description Full Thickness Without Exposed Support Classification: Structures Exudate None Present Amount: Foul Odor After Cleansing: No Slough/Fibrino No Wound Bed Granulation Amount: None Present (0%) Necrotic Amount: Large (67-100%) Necrotic Quality: Eschar Periwound Skin Texture Texture Color No Abnormalities Noted: No No Abnormalities Noted: No Callus: Yes Atrophie Blanche: No Wuertz, Cait L. ([id number]) Crepitus: No Cyanosis: No Excoriation: No Ecchymosis: No Induration: No Erythema: No Rash: No Hemosiderin Staining: No Scarring: No Mottled: No Pallor: No Moisture Rubor: No No Abnormalities Noted: No Dry / Scaly: No Maceration: No Wound Preparation Ulcer Cleansing: Rinsed/Irrigated with Saline Topical Anesthetic Applied: Other: lidocaine 4%, Treatment Notes Wound #6 (Left, Lateral Foot) Notes idoflex, rolled gauze, coban Electronic Signature(s) Signed: 11/14/2018 4:20:57 PM By: Rema Jasmine Entered By: Rema Jasmine on 11/14/2018 09:07:59 Lawless, [name] ([id number]) -------------------------------------------------------------------------------- Vitals Details Patient Name: [name] Date of Service: 11/14/2018 8:45 AM Medical Record Number: [id number] Patient Account Number: [id number] Date of Birth/Sex: [birth year] (57 y.o. F) Treating RN: Rema Jasmine Primary Care Niaja Stickley: Joen [name] Other Clinician: Referring Heath Badon: Joen [name] Treating Aryanne Gilleland/Extender: Altamese [place] in Treatment: 0 Vital Signs Time Taken: 08:58 Temperature (F): 99.0 Pulse (bpm): 103 Respiratory Rate (breaths/min): 20 Blood Pressure (mmHg): 136/68 Reference Range: 80 - 120 mg / dl Electronic Signature(s) Signed: 11/14/2018 4:20:57 PM By: Rema Jasmine Entered ByRema Jasmine on 11/14/2018 08:58:55

## 2018-11-16 NOTE — Progress Notes (Signed)
Katelyn, [name] ([id number]) Visit Report for 11/14/2018 Chief Complaint Document Details Patient Name: [name], [name] Date of Service: 11/14/2018 8:45 AM Medical Record Number: [id number] Patient Account Number: [id number] Date of Birth/Sex: 1961-07-24 (57 y.o. F) Treating RN: Huel Coventry Primary Care Provider: Joen [name] Other Clinician: Referring Provider: Joen [name] Treating Provider/Extender: Altamese [place] in Treatment: 0 Information Obtained from: Patient Chief Complaint Patient is here for follow-up evaluation of her right ischial pressure ulcer 11/14/18; patient returns to clinic for review of the wound on the left plantar heel that is been present for 3 months Electronic Signature(s) Signed: 11/14/2018 5:31:58 PM By: Baltazar Najjar MD Entered By: Baltazar Najjar on 11/14/2018 09:46:27 [name], [name] ([id number]) -------------------------------------------------------------------------------- Debridement Details Patient Name: [name] Date of Service: 11/14/2018 8:45 AM Medical Record Number: [id number] Patient Account Number: [id number] Date of Birth/Sex: 03-07-1961 (57 y.o. F) Treating RN: Huel Coventry Primary Care Provider: Joen [name] Other Clinician: Referring Provider: Joen [name] Treating Provider/Extender: Altamese [place] in Treatment: 0 Debridement Performed for Wound #6 Left,Lateral Foot Assessment: Performed By: Physician Maxwell Caul, MD Debridement Type: Debridement Level of Consciousness (Pre- Awake and Alert procedure): Pre-procedure Verification/Time Yes - 09:33 Out Taken: Start Time: 09:33 Pain Control: Lidocaine Total Area Debrided (L x W): 2.2 (cm) x 2 (cm) = 4.4 (cm) Tissue and other material Viable, Eschar, Subcutaneous debrided: Level: Skin/Subcutaneous Tissue Debridement Description: Excisional Instrument: Curette Bleeding: Minimum Hemostasis Achieved: Pressure End Time: 09:35 Procedural Pain:  Insensate Post Procedural Pain: 0 Response to Treatment: Procedure was tolerated well Level of Consciousness Awake and Alert (Post-procedure): Post Debridement Measurements of Total Wound Length: (cm) 2.2 Width: (cm) 2 Depth: (cm) 0.9 Volume: (cm) 3.11 Character of Wound/Ulcer Post Debridement: Stable Post Procedure Diagnosis Same as Pre-procedure Electronic Signature(s) Signed: 11/14/2018 5:21:06 PM By: Elliot Gurney, BSN, RN, CWS, Kim RN, BSN Signed: 11/14/2018 5:31:58 PM By: Baltazar Najjar MD Entered By: Baltazar Najjar on 11/14/2018 09:46:01 [name], [name] ([id number]) -------------------------------------------------------------------------------- HPI Details Patient Name: [name] Date of Service: 11/14/2018 8:45 AM Medical Record Number: [id number] Patient Account Number: [id number] Date of Birth/Sex: 05-08-1961 (57 y.o. F) Treating RN: Huel Coventry Primary Care Provider: Joen [name] Other Clinician: Referring Provider: Joen [name] Treating Provider/Extender: Altamese [place] in Treatment: 0 History of Present Illness HPI Description: 57 year old patient who was seen by visiting [name] wound care specialist for a wound on both her buttock and was found to have an unstageable wound on the right buttock for about 2 months. I understand that she had a fall and was laying on the floor for about 48 hours before she was found and taken to the ICU and had a long injury to her gluteal area from pressure and also had broken her right humerus. She has had a right proximal humerus fracture and has been followed up with orthopedics recently. The patient has a past medical history of type 2 diabetes mellitus, paraparesis, acute pyelonephritis, GERD, hypertension, glaucoma, chronic pain, anxiety neurosis, nicotine dependence, COPD. the patient had some debridement done and was to operative was recommended to use Silvadene dressing and offloading. She is a smoker and occasionally  smokes a few cigarettes. the patient requested a second opinion for months and is here to discuss her care. 09/21/16; the patient re-presents from home today for review of 3 different wounds. I note that she was seen in the clinic here in July at which time she had bilateral buttock wounds. It was apparently suggested at that time  that she use a wound VAC bridged to both wounds just near the initial tuberosity's bilaterally which she refused. The history was a bit difficult to put together. Apparently this patient became ill at the end of April of this year. She was found sitting on the floor she had apparently been for 2 days and subsequently admitted to hospital from 04/23/16 through 05/02/16 and at that point she was critically ill ultimately having sepsis secondary to UTI, nontraumatic rhabdomyolysis and diabetic ketoacidosis. She had acute renal failure and I think required ICU care including intubation. Patient states her wounds actually started at that point on the bilateral issue tuberosities however in reviewing the discharge summary from 5/8 I see no reference to wounds at that point. It did state that she had left lower extremity cellulitis however. Reviewing Epic I see no relevant x-rays. It would appear that her discharge creatinine was within the normal range and indeed on 9/15 her creatinine has remained normal. [facility] There the wounds on her bilateral Buttocks were dressed. Only just before her discharge from the nursing facility she developed an "knot" which was interpreted as cellulitis on the posterior aspect of her left knee she was given antibiotics. Apparently sometime late in July a this actually opened and became a wound at home health care was tending to however she is still having purulent drainage coming from this and by my understanding the wound depth is actually become unmeasurable. I am not really clear about  what home health has been placing in any of these wound areas. The patient states that is something with silver and it. She is not been systemically unwell no fever or chills her appetite is good. She is a diabetic poorly controlled however she states that her recent blood sugars at home have been in the low to mid 100s. 09/28/16 On evaluation today patient appears to continue to exhibit the 3 areas of ulceration that were noted previous. She did have an x-ray of the right pelvis which showed evidence of potential soft tissue infection but no obvious osteomyelitis. There was a discussion last office visit concerning the possibility of a wound VAC. Witth that being said the x-ray report suggested that an MRI may be more appropriate to further evaluate the extent. Subsequently in regard to the wound over the popliteal portion of the left lower extremity with tunneling at 12:00 the CT scan that was ordered was denied by insurance as they state the patient has not had x-rays prior to advanced imaging. Patient states that she is frustrated with the situation overall. 10/05/16 in the interval since I last saw this patient last week she has had the x-ray of the knee performed. I did review that x-ray today and fortunately shows no evidence of osteomyelitis or other acute abnormality at this point in time. She continues to have the opening iin the posterior left popliteal space with tracking proximal up the posterior thigh. Nothing seems to have worsened but it also seems to have not improved. The same is true in regard to the right pressure ulcer over the gluteal region which extends toward the ischium. The left gluteal pressure ulcer actually appears to be doing somewhat better my opinion there is some necrotic slough but overall this appears fairly well. She tells me thatt she has some discomfort especially when home health is helping her with dressing changes as they do not know her. At worse she rates  her pain  to be a 5 out of 10 right now it's more like a 1 out of 10. [name], [name] ([id number]) 10/07/16; still the patient has 3 different wound areas. She has a deep stage IV wound over her right ischial tuberosity. She is due to have an MRI next week. The wound over her left ischial tuberosity is more superficial and underwent debridement today. Finally she has a small open area in her left popliteal fossa the probes on measurably forward superiorly. Still a lot of drainage coming out of this. The CT scan that I ordered 3 weeks ago was questioned by her insurance company wanting a plain x-ray first. As I understand things result of this is nothing has been done in 3 weeks in terms of imaging the thigh and she has an MRI booked of this along with her pelvis for next week line 10/12/16; patient has a deep probing wound over the right ischiall tuberosity, stage III wound over the left visual tuberosity and a draining sinus in her left popliteal fossa. None of this much different from when I saw this 3 weeks ago. We have been using silver rope to the right ischial wound and a draining area in the left popliteal fossa. Plain silver alginate to the area on the left ischial tuberosity 10/19/16; the patient's wounds are essentially unchanged although the area on the left lower gluteal is actually improved. Our intake nurse noted drainage from the right initial tuberosity probing wound as well as the draining area in the left popliteal fossa. Both of these were cultured. She had x-rays I think at the insistence of her insurance company on 09/23/16 x-ray of the pelvis was not particularly helpful she did have soft tissue air over the right lower pelvis although with the depth of this wound this is not surprising. An x-ray of her left knee did not show any specific abnormalities. We are still using silver alginate to these wound areas. Her MRI is booked for 10/27 10/26/16; cultures of the purulent drainage  in her right initial tuberosity wound grew moderate Proteus and few staph aureus. The same organisms were cultured out of the left knee sinus tract posteriorly. The staph aureus is MRSA. I had started her on Augmentin last week I added doxycycline. The MRI of the left lower extremity and pelvis was finally done. The MRI of the femur showed subcutaneous soft tissue swelling edema fluid and myositis in the vastus lateralis muscle but no soft tissue abscess septic arthritis or osteomyelitis. MRI of the pelvis showed the left wound to be more expensive extending down to the bone there was osteomyelitis. Left hamstring tendons were also involved. No septic arthritis involving the hip. The decubitus ulcer on the right side showed no definite osteomyelitis or abscess.. The right hip wound is actually the one the probes 6 cm downward. But the MRI showing infection including osteomyelitis on the left explains the draining sinus in the popliteal fossa on the left. She did have antibiotics in the hospitalization last time and this extended into her nursing home stay but I'm not exactly sure what antibiotics and for what duration. According the patient this did include vancomycin with considerable effort of our staff we are able to get the patient into see Dr. Sampson Goon today. There were transportation difficulties. Her mother had open heart surgery and is in the ICU in [place] therefore her brother was unable to transport. Dr. Jarrett Ables office graciously arranged time to see her today. From my point of view  she is going to require IV vancomycin plus perhaps a third generation cephalosporin. I plan to keep her on doxycycline and Augmentin until the IV antibiotics can be arranged. 11/02/2016 - Harrison presents today for management of ulcers; She saw Dr. 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 She is to have the  PICC line placed today and receive the initial dose for both antibiotics today. She has yet to receive the offloading chair cushion and/or mattress overlay from home health, apparently this has been a 3 week process. I encouraged her to speak to home health regarding this matter, along with offering home health to contact the wouns care center with any questions or concerns. The left ischial pressure ulcer continues to imporve, while to right ischial ulcer has increased in depth. The popliteal fossa sinus tract remains unmeasurable due to the limitation of depth measurement (tract extends beyond our measuring devices). 11-16-2016 Ms. Kraska presents today for evaluation and management of bilateral ischial stage IV pressure ulcers and sinus tract to the left popliteal fossa. she is under the care of Dr. Sampson Goon for IV antibiotic therapy; she states that the vancomycin was placed on hold and will be restarted at a lower dose based on her renal function. She continues taking Zosyn in addition to the vancomycin. She also states that she has yet to receive offloading cushions from home health, according to her she does not qualify for these offloading cushions because "the ulcers are unstageable ". We will contact the home health agency today to lend clarity regarding her pressure ulcers. The left popliteal fossa sinus tract continues to be a measurable as it extends beyond the length of our measuring devices.. 11/30/16; the patient is still on vancomycin and Zosyn. The depth of the draining sinus behind her left popliteal fossa is down to 4 cm although there is still serosanguineous drainage coming out of this. She saw Dr. Sampson Goon of infectious disease yesterday the idea is to weeks more of IV antibiotics and then oral antibiotics although I have not read his note. The area on the left gluteal fold is just about healed. She has a 6 cm draining sinus over the right initial tuberosity although I cannot  feel bone at the base of this. As far as the patient is aware she has not had a recheck of her inflammatory markers. 12/07/16; patient is on vancomycin and Zosyn appointment with Dr. Sampson Goon on the 19th at which point the patient expects to have a change in antibiotics. Remarkable improvement over the wound over the left ischial tuberosity which is just about closed. The draining sinus in her popliteal fossa has 0.4 cm in depth. The area on the right ischial tuberosity still probes down 7 cm. This is closed and overall wound dimensions but not depth. 12/14/16; patient is completing her vancomycin and Zosyn and per her she is going to transition to Bactrim and Augmentin for another 3 weeks. The area on her left gluteal fold is closed except for some skin tears. The area behind her left knee is no longer has any depth. The only remaining area that is of clinical concern is on the right gluteal fold probing towards the right [name], [name] ([id number]) ischial tuberosity. Today this measures 6.9 cm in depth. Very gritty surface 12/21/16; patient is now on Bactrim and Augmentin as directed by infectious disease. This should be for another 2 weeks. The area in her left gluteal  fold and left popliteal are closed over and fully healed. Measurements today at 7 cm in the right buttock wound is unchanged from last week. 12/27/16; patient is on Bactrim and Augmentin for another week as directed by infectious disease. She has completed her IV antibiotics. She is not been systemically unwell no fever no chills. The area on her right buttock measured over 6 cm in depth. There is no palpable bone. No evidence of surrounding soft tissue infection. She is complaining of tongue irritation and has a history of thrush 01/04/17; patient is been back to see Dr. Sampson Goon, her Augmentin was stopped but he continued the Bactrim for another 3 weeks. Depth of the wound is 6.7 cm there is been no major change in either  direction. She is not receive the wound VAC from home health I think because of confusion about who is supposed to provided will actually talk to the home health agency today [[facility] The net no major change. 01/18/17; patient obtained her wound VAC about 10 days ago however for some reason it was not actually put on the wound. She is therefore here for [place] to apply this I guess. No other issues are noted. She is not complaining of pain fever drainage 02/01/17; patient is here now having the wound VAC or 3-4 weeks to a deep pressure area over the right initial tuberosity. This measures 6 cm in depth today which is about half a centimeter better than 2 weeks ago. There is no evidence she is systemically unwell no fever no chills no pain around the area. 02/15/17; I follow this patient every 2 weeks for a deep area over the right ischial tuberosity. This measures 5.5 cm today which is a continued improvement of 1.2 cm from 1/10 and down 0.5 cm from her visit 2 weeks ago 03/01/17; continued difficult area over the right initial tuberosity using the wound VAC. Depth today of 5.1 cm which is improved. Does not appear to be a lot of drainage in the canister. Antibiotics were finally stopped by Dr. Sampson Goon bactrim[]  and inflammatory markers have been repeated 03/15/17; fall this lady every 2 weeks for a difficult area over her lower right gluteal area/ischial tuberosity. Depth today at 4.6 cm. This is a slow but steady improvement in the depth of this wound. Although we have labeled this as a pressure ulcer there may have been an underlying infection here at one point before we saw her. She had osteomyelitis on the left extensively which is since resolved 03/29/17- patient is here for follow-up evaluation of her right ischial pressure ulcer. She continues with the wound VAC and home health. According to the nurse home health has been using less foams and appropriate and we will instruct accordingly. The  patient continues to smoke, approximately 10 cigarettes a day. She has been advised to decrease that in half by her next appointment, with a goal of complete cessation. She states her blood sugars have been consistently less than 150. She states that she spends most of her day position left lateral or prone. She does have an air mattress on her bed, she does not have an mattress for her chair, she states she cannot afford this. 04/12/17; patient is here for evaluation of her right ischial pressure ulcer. We continue to use a wound VAC with minimal improvement today the depth of this measuring 4.4 cm versus 4.6 cm 2 weeks ago. We are using a [employer] wound VAC on this area. There is not excessive drainage  no pain. The patient tells me she tries to keep off this in bed but is up in the wheelchair for 2 hours a day. She is limited in no her overall ambulation but is improving and apparently is getting bilateral lower extremity braces which she hopes will improve her ability to walk independently. 05/10/17; Depth at 3.3cm. Improved 05/24/17; depth at 3.5 cm. This is not improved since last time. Not clear if they are using collagen under the foam 06/07/17; depth that 2.9 which is a slight improvement. Still using the wound VAC. 06/21/17; depth is 2.9 cm which is exactly the same as last time. Also the appearance of this wound is completely the same. We have been using silver collagen under a wound VAC. 07/05/17 patient presents today for reevaluation concerning her right Ischial wound. She had switched insurance companies and so it does appear that the Wound VAC needs to be reauthorized which we are working on this morning. Nonetheless her wound has been saying about the same we have continued to use the silver collagen underneath the wound VAC. She has no discomfort. 07/19/17; patient follows every 2 weeks for her right if she'll wound. Since I last saw this a month ago her dimensions of come down to 2.6 cm.  This is slightly down from a month ago when it was 2.9 cm in 4 months ago it was 4.6 cm. I note that there were insurance company issues with regard to the wound VAC which she apparently is not having on for several weeks now. I think it would be reasonable to change therapies here. Will use silver alginate rope 08/02/17 on evaluation today patient's wound actually appears to be doing significantly better in regard to the depth as compared to her previous evaluation. She is having no discomfort. Unfortunately she never got the Wound VAC reapproved following the insurance issues from several weeks ago. With that being said it does not appear that she needs to requires the Wound VAC anymore and in fact I feel that she is doing better and making greater improvements at this point without it. Fortunately she has no nausea, vomiting, diarrhea, fevers, or chills. 08/16/17; patient's wound depth down to 2.3 cm. She is using silver alginate packing rope. Note that her wound VAC was discontinued due to insurance issues. This is not particularly surprising. She has not been systemically unwell and has no other new complaints 08/30/17; no change in depth. Still at 2.3 cm. Still with the same gritty surface requiring debridement. I've been using silver Foiles, [name] ([id number]) alginate for quite a period of time although this came down nicely in the last month 10/04/17; the depth of this is 2 cm however with careful inspection under high-intensity light most of the walls of this small probing sinus seemed to be normally epithelialized. I cannot exactly see the base of this however there is been no drainage. We've been using silver alginate there is no drainage on the dressing when it is removed. 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 There is no evidence of surrounding infection or pain. The patient had many review weakness in her bilateral legs. She is already been  to see a neurologist who according the patient told her "you would never walk again" 11/01/17; this is a patient I last saw a month ago. At that point the linear tunnel in her right buttock was 2 cm. It was not possible to see the depth of this as skin had  grown into the tunnel. In light of the fact that there was no drainage and no visible wound I recommended that we just allow her to go about her usual activity without dressing. She reports that almost immediately she noted drainage on her clothing although in spite of her instructions the contrary she did not come back to the clinic. She has not been specifically addressing this and is continued to no drainage without other symptoms either local or systemic 11/29/17; I follow this woman monthly. Last time the wound on her right buttock was 1.8 cm today measuring at 1.4 there has been gradual improvement in this. Concerning is the fact that the patient says that Dr. Sampson Goon of infectious disease changed her from doxycycline to Augmentin apparently because of the elevated inflammatory markers. She also said he did a culture of this area but she is not heard the results. I don't think I have his information available on care everywhere however I will check this. She is using Hydrofera Blue rope. The patient and her intake nurse report that she is still having identifiable drainage which certainly makes it clear that this is not closed. 01/02/17 on evaluation today patient appears to be doing fairly well in regard to her left gluteal wound. She has been continuing with the [facility] Dressing's here in our office. We actually see her once a month and then subsequently are performing weekly dressing changes with Hydrofera Blue Dressing rope during nurse visits one time a week. Today there really was not much drainage at this point. However this has happened previously where she had no drainage and was essentially thought to be close internally  although the external had not completely pulled together. However then she reopened and began draining again. He has been mentioned the possibility of her seeing plastic surgery to try to get this finally and completely healed. 01/18/18 she is here in follow-up evaluation for right ischial pressure ulcer. She has an appointment with [facility] plastic surgery on 2/8 for evaluation. She continues to have minimal drainage at the tip of the dressing product. She continues to smoke, admits to 8-10 cigarettes per day. No change in treatment plan 02/06/18 on evaluation today patient appears to be doing about the same in regard to her right Ischial wound. She has been tolerating the dressing changes without complication. She did have her appointment at [facility] [facility] plastic surgery she was not really impressed. They stated that due to her weight they were unable to perform a flap and subsequently recommended the only thing they could attempt would be to exercise around the ulcer and then attempt to suture it together. The question is whether this would be effective or not. Patient really is not wanting to proceed down that road. Nonetheless she really have not changed much in regard to her wound measurements. 03/06/18 on evaluation today patient states that last week when I perform the silver nitrate that she actually had some burning pain for about two days following. Since that time the area has been very dry and she states that it is also been very dark and scabbed around the area. With that being said other than this she feels that the hope was the fact that it was burning was a good sign and that they would be improvement but the really does not appear to be significant improvement at this point. We have tried a lot of different dressings for her most recently Hydrofera Blue Dressing, silver alginate rope, and collagen. We have  never attempted Endoform which I think could be a possibility for her. 03/22/18-She is here  for a follow-up evaluation of her right ischial pressure ulcer. There is no drainage on her dressings and what appears to be epidermal debris in the ulcer; she has no pain. The ulcer was cleanse with betadine, I believe it is healed, despite remaining a  1" depth. We will place a dry dressing over and evaluate next week for drainage, if no drainage she will be discharged at that time. 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 She underwent a prolonged course of IV antibiotics as directed by Dr. Sampson Goon. The tunneling right ischial pressure ulcer as eventually closed over. We've been following this over the last month or so with no identifiable area that was open. We've been using Endoform and hydrogel. She rising clinic today, our intake nurse reported some dampness. READMISSION 11/14/18 This patient is a 57 year old woman who we've previously had in clinic for a very long period of time finally culminating in April 2019. At that point she had bilateral pressure ulcers over her ischial tuberosities complicated by underlying osteomyelitis and [name], [name] ([id number]) a large tracking area that gave her wound in the left popliteal fossa. She required a prolonged course of IV antibiotics and wound care in the last wound on her right ischial tuberosity was closed out in April/19. She claims that this is still closed. We did not review this. The patient is a type II diabetic on insulin. She states that 3 months ago she noted loose skin on her heel which she pulled off. She then had a deeper area that she filed down with an instrument. She's been left with a wound on the left heel which she says is actually better and less deep. More recently she's been using peroxide and leaving  it open to air. 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 We were not able to obtain ABIs in this clinic Electronic Signature(s) Signed: 11/14/2018 5:31:58 PM By: Baltazar Najjar MD Entered By: Baltazar Najjar on 11/14/2018 09:48:58 Mcmullan, [name] -------------------------------------------------------------------------------- Physical Exam Details Patient Name: [name] Date of Service: 11/14/2018 8:45 AM Medical Record Number: [id number] Patient Account Number: [id number] Date of Birth/Sex: 1961-09-16 (57 y.o. F) Treating RN: Huel Coventry Primary Care Provider: Joen [name] Other Clinician: Referring Provider: Joen [name] Treating Provider/Extender: Maxwell Caul Weeks in Treatment: 0 Constitutional Sitting or standing Blood Pressure is within target range for patient.. Pulse regular and within target range for patient.Marland Kitchen Respirations regular, non-labored and within target range.. Temperature is normal and within the target range for the patient.Marland Kitchen appears in no distress. Eyes Conjunctivae clear. No discharge. Respiratory Respiratory effort is easy and symmetric bilaterally. Rate is normal at rest and on room air.. Bilateral breath sounds are clear and equal in all lobes with no wheezes, rales or rhonchi.. Cardiovascular Faintly palpable at the femoral. I could not feel popliteal pulses. Pedal pulses absent bilaterally.. Lymphatic None palpable in the popliteal or inguinal area on the left. Integumentary (Hair, Skin) No primary skin issue is seen. Neurological Markedly reduced sensory. Psychiatric No evidence of depression, anxiety, or agitation. Calm, cooperative, and communicative. Appropriate interactions and affect.Marland Kitchen  Notes Wound exam; the area and questions on the plantar left heel. Very significant  amount of necrotic surface debris and nonviable subcutaneous tissue removed with a #3 curet. Hemostasis with direct pressure. I then used a #15 blade to remove some overhanging skin The wound bed looks quite reasonable post debridement but will require ongoing debridement. There is no evidence of surrounding infection. Electronic Signature(s) Signed: 11/14/2018 5:31:58 PM By: Baltazar Najjar MD Entered By: Baltazar Najjar on 11/14/2018 09:51:08 [name], [name] ([id number]) -------------------------------------------------------------------------------- Physician Orders Details Patient Name: [name] Date of Service: 11/14/2018 8:45 AM Medical Record Number: [id number] Patient Account Number: [id number] Date of Birth/Sex: [birth year] (57 y.o. F) Treating RN: Huel Coventry Primary Care Provider: Joen [name] Other Clinician: Referring Provider: Joen [name] Treating Provider/Extender: Altamese Altha in Treatment: 0 Verbal / Phone Orders: No Diagnosis Coding Wound Cleansing Wound #6 Left,Lateral Foot o Clean wound with Normal Saline. Anesthetic (add to Medication List) Wound #6 Left,Lateral Foot o Topical Lidocaine 4% cream applied to wound bed prior to debridement (In Clinic Only). Primary Wound Dressing Wound #6 Left,Lateral Foot o Iodoflex Secondary Dressing Wound #6 Left,Lateral Foot o Foam o Conform/Kerlix Dressing Change Frequency Wound #6 Left,Lateral Foot o Change Dressing Monday, Wednesday, Friday Follow-up Appointments Wound #6 Left,Lateral Foot o Return Appointment in 1 week. Edema Control Wound #6 Left,Lateral Foot o Elevate legs to the level of the heart and pump ankles as often as possible Off-Loading Wound #6 Left,Lateral Foot o Other: - Keep pressure off of left heel Additional Orders / Instructions Wound #6 Left,Lateral Foot o Activity as tolerated Home Health Wound #6 Left,Lateral Foot o Initiate Home Health for Skilled  Nursing MAJEL, GIEL ([id number]) o Home Health Nurse may visit PRN to address patientos wound care needs. o FACE TO FACE ENCOUNTER: MEDICARE and MEDICAID PATIENTS: I certify that this patient is under my care and that I had a face-to-face encounter that meets the physician face-to-face encounter requirements with this patient on this date. The encounter with the patient was in whole or in part for the following MEDICAL CONDITION: (primary reason for Home Healthcare) MEDICAL NECESSITY: I certify, that based on my findings, NURSING services are a medically necessary home health service. HOME BOUND STATUS: I certify that my clinical findings support that this patient is homebound (i.e., Due to illness or injury, pt requires aid of supportive devices such as crutches, cane, wheelchairs, walkers, the use of special transportation or the assistance of another person to leave their place of residence. There is a normal inability to leave the home and doing so requires considerable and taxing effort. Other absences are for medical reasons / religious services and are infrequent or of short duration when for other reasons). o If current dressing causes regression in wound condition, may D/C ordered dressing product/s and apply Normal Saline Moist Dressing daily until next Wound Healing Center / Other MD appointment. Notify Wound Healing Center of regression in wound condition at [phone]. o Please direct any NON-WOUND related issues/requests for orders to patient's Primary Care Physician Services and Therapies o Arterial Studies- Bilateral Electronic Signature(s) Signed: 11/14/2018 5:21:06 PM By: Elliot Gurney, BSN, RN, CWS, Kim RN, BSN Signed: 11/14/2018 5:31:58 PM By: Baltazar Najjar MD Entered By: Elliot Gurney, BSN, RN, CWS, Kim on 11/14/2018 09:39:04 Reinhold, [name] ([id number]) -------------------------------------------------------------------------------- Problem List Details Patient Name:  [name] Date of Service: 11/14/2018 8:45 AM Medical Record Number: [id number] Patient Account Number: [id number] Date of Birth/Sex: 30-May-1961 (57 y.o. 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 11/14/2018 No Yes layer exposed E11.42 Type 2 diabetes mellitus with diabetic polyneuropathy 11/14/2018 No Yes E11.51 Type 2 diabetes mellitus with diabetic peripheral angiopathy 11/14/2018 No Yes without gangrene Inactive Problems Resolved Problems Electronic Signature(s) Signed: 11/14/2018 5:31:58 PM By: Baltazar Najjar MD Entered By: Baltazar Najjar on 11/14/2018 09:44:45 Waldridge, [name] ([id number]) -------------------------------------------------------------------------------- Progress Note Details Patient Name: [name] Date of Service: 11/14/2018 8:45 AM Medical Record Number: [id number] Patient Account Number: [id number] Date of Birth/Sex: [birth year] (57 y.o. F) Treating RN: Huel Coventry Primary Care Provider: Joen [name] Other Clinician: Referring Provider: Joen [name] Treating Provider/Extender: Altamese Okreek in Treatment: 0 Subjective Chief Complaint Information obtained from Patient Patient is here for follow-up evaluation of her right ischial pressure ulcer 11/14/18; patient returns to clinic for review of the wound on the left plantar heel that is been present for 3 months History of Present Illness (HPI) 57 year old patient who was seen by visiting [name] wound care specialist for a wound on both her buttock and was found to have an unstageable wound on the right buttock for about 2 months. I  understand that she had a fall and was laying on the floor for about 48 hours before she was found and taken to the ICU and had a long injury to her gluteal area from pressure and also had broken her right humerus. She has had a right proximal humerus fracture and has been followed up with orthopedics recently. The patient has a past medical history of type 2 diabetes mellitus, paraparesis, acute pyelonephritis, GERD, hypertension, glaucoma, chronic pain, anxiety neurosis, nicotine dependence, COPD. the patient had some debridement done and was to operative was recommended to use Silvadene dressing and offloading. She is a smoker and occasionally smokes a few cigarettes. the patient requested a second opinion for months and is here to discuss her care. 09/21/16; the patient re-presents from home today for review of 3 different wounds. I note that she was seen in the clinic here in July at which time she had bilateral buttock wounds. It was apparently suggested at that time that she use a wound VAC bridged to both wounds just near the initial tuberosity's bilaterally which she refused. The history was a bit difficult to put together. Apparently this patient became ill at the end of April of this year. She was found sitting on the floor she had apparently been for 2 days and subsequently admitted to hospital from 04/23/16 through 05/02/16 and at that point she was critically ill ultimately having sepsis secondary to UTI, nontraumatic rhabdomyolysis and diabetic ketoacidosis. She had acute renal failure and I think required ICU care including intubation. Patient states her wounds actually started at that point on the bilateral issue tuberosities however in reviewing the discharge summary from 5/8 I see no reference to wounds at that point. It did state that she had left lower extremity cellulitis however. Reviewing Epic I see no relevant x-rays. It would appear that her discharge creatinine was within the  normal range and indeed on 9/15 her creatinine has remained normal. [facility] There the wounds on her bilateral Buttocks were dressed. Only  just before her discharge from the nursing facility she developed an "knot" which was interpreted as cellulitis on the posterior aspect of her left knee she was given antibiotics. Apparently sometime late in July a this actually opened and became a wound at home health care was tending to however she is still having purulent drainage coming from this and by my understanding the wound depth is actually become unmeasurable. I am not really clear about what home health has been placing in any of these wound areas. The patient states that is something with silver and it. She is not been systemically unwell no fever or chills her appetite is good. She is a diabetic poorly controlled however she states that her recent blood sugars at home have been in the low to mid 100s. 09/28/16 On evaluation today patient appears to continue to exhibit the 3 areas of ulceration that were noted previous. She did have an x-ray of the right pelvis which showed evidence of potential soft tissue infection but no obvious osteomyelitis. There was a discussion last office visit concerning the possibility of a wound VAC. Witth that being said the x-ray report suggested that an MRI may be more appropriate to further evaluate the extent. Subsequently in regard to the wound over the popliteal portion of the left lower extremity with tunneling at 12:00 the CT scan that was ordered was denied by insurance as they state the patient has not had x-rays prior to advanced imaging. Patient states that she is frustrated with the situation overall. 10/05/16 in the interval since I last saw this patient last week she has had the x-ray of the knee performed. I did review that x-ray today and fortunately shows no evidence of osteomyelitis or other  acute abnormality at this point in time. She continues [name], [name] ([id number]) to have the opening iin the posterior left popliteal space with tracking proximal up the posterior thigh. Nothing seems to have worsened but it also seems to have not improved. The same is true in regard to the right pressure ulcer over the gluteal region which extends toward the ischium. The left gluteal pressure ulcer actually appears to be doing somewhat better my opinion there is some necrotic slough but overall this appears fairly well. She tells me thatt she has some discomfort especially when home health is helping her with dressing changes as they do not know her. At worse she rates her pain to be a 5 out of 10 right now it's more like a 1 out of 10. 10/07/16; still the patient has 3 different wound areas. She has a deep stage IV wound over her right ischial tuberosity. She is due to have an MRI next week. The wound over her left ischial tuberosity is more superficial and underwent debridement today. Finally she has a small open area in her left popliteal fossa the probes on measurably forward superiorly. Still a lot of drainage coming out of this. The CT scan that I ordered 3 weeks ago was questioned by her insurance company wanting a plain x-ray first. As I understand things result of this is nothing has been done in 3 weeks in terms of imaging the thigh and she has an MRI booked of this along with her pelvis for next week line 10/12/16; patient has a deep probing wound over the right ischiall tuberosity, stage III wound over the left visual tuberosity and a draining sinus in her left popliteal fossa. None of this much different from when I saw  this 3 weeks ago. We have been using silver rope to the right ischial wound and a draining area in the left popliteal fossa. Plain silver alginate to the area on the left ischial tuberosity 10/19/16; the patient's wounds are essentially unchanged although the area on  the left lower gluteal is actually improved. Our intake nurse noted drainage from the right initial tuberosity probing wound as well as the draining area in the left popliteal fossa. Both of these were cultured. She had x-rays I think at the insistence of her insurance company on 09/23/16 x-ray of the pelvis was not particularly helpful she did have soft tissue air over the right lower pelvis although with the depth of this wound this is not surprising. An x-ray of her left knee did not show any specific abnormalities. We are still using silver alginate to these wound areas. Her MRI is booked for 10/27 10/26/16; cultures of the purulent drainage in her right initial tuberosity wound grew moderate Proteus and few staph aureus. The same organisms were cultured out of the left knee sinus tract posteriorly. The staph aureus is MRSA. I had started her on Augmentin last week I added doxycycline. The MRI of the left lower extremity and pelvis was finally done. The MRI of the femur showed subcutaneous soft tissue swelling edema fluid and myositis in the vastus lateralis muscle but no soft tissue abscess septic arthritis or osteomyelitis. MRI of the pelvis showed the left wound to be more expensive extending down to the bone there was osteomyelitis. Left hamstring tendons were also involved. No septic arthritis involving the hip. The decubitus ulcer on the right side showed no definite osteomyelitis or abscess.. The right hip wound is actually the one the probes 6 cm downward. But the MRI showing infection including osteomyelitis on the left explains the draining sinus in the popliteal fossa on the left. She did have antibiotics in the hospitalization last time and this extended into her nursing home stay but I'm not exactly sure what antibiotics and for what duration. According the patient this did include vancomycin with considerable effort of our staff we are able to get the patient into see Dr. Sampson Goon  today. There were transportation difficulties. Her mother had open heart surgery and is in the ICU in [place] therefore her brother was unable to transport. Dr. Jarrett Ables office graciously arranged time to see her today. From my point of view she is going to require IV vancomycin plus perhaps a third generation cephalosporin. I plan to keep her on doxycycline and Augmentin until the IV antibiotics can be arranged. 11/02/2016 - Maleya presents today for management of ulcers; She saw Dr. 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 She is to have the PICC line placed today and receive the initial dose for both antibiotics today. She has yet to receive the offloading chair cushion and/or mattress overlay from home health, apparently this has been a 3 week process. I encouraged her to speak to home health regarding this matter, along with offering home health to contact the wouns care center with any questions or concerns. The left ischial pressure ulcer continues to imporve, while to right ischial ulcer has increased in depth. The popliteal fossa sinus tract remains unmeasurable due to the limitation of depth measurement (tract extends beyond our measuring devices). 11-16-2016 Ms. Mcnutt presents today for evaluation and management of bilateral ischial stage IV pressure ulcers and sinus tract to the  left popliteal fossa. she is under the care of Dr. Sampson Goon for IV antibiotic therapy; she states that the vancomycin was placed on hold and will be restarted at a lower dose based on her renal function. She continues taking Zosyn in addition to the vancomycin. She also states that she has yet to receive offloading cushions from home health, according to her she does not qualify for these offloading cushions because "the ulcers are unstageable ". We will contact the home health agency today to lend clarity  regarding her pressure ulcers. The left popliteal fossa sinus tract continues to be a measurable as it extends beyond the length of our measuring devices.. 11/30/16; the patient is still on vancomycin and Zosyn. The depth of the draining sinus behind her left popliteal fossa is down to 4 cm although there is still serosanguineous drainage coming out of this. She saw Dr. Sampson Goon of infectious disease yesterday the idea is to weeks more of IV antibiotics and then oral antibiotics although I have not read his note. The area on the left gluteal fold is just about healed. She has a 6 cm draining sinus over the right initial tuberosity although I cannot feel bone at the base of this. As far as the patient is aware she has not had a recheck of her inflammatory markers. ALSIE, YOUNES ([id number]) 12/07/16; patient is on vancomycin and Zosyn appointment with Dr. Sampson Goon on the 19th at which point the patient expects to have a change in antibiotics. Remarkable improvement over the wound over the left ischial tuberosity which is just about closed. The draining sinus in her popliteal fossa has 0.4 cm in depth. The area on the right ischial tuberosity still probes down 7 cm. This is closed and overall wound dimensions but not depth. 12/14/16; patient is completing her vancomycin and Zosyn and per her she is going to transition to Bactrim and Augmentin for another 3 weeks. The area on her left gluteal fold is closed except for some skin tears. The area behind her left knee is no longer has any depth. The only remaining area that is of clinical concern is on the right gluteal fold probing towards the right ischial tuberosity. Today this measures 6.9 cm in depth. Very gritty surface 12/21/16; patient is now on Bactrim and Augmentin as directed by infectious disease. This should be for another 2 weeks. The area in her left gluteal fold and left popliteal are closed over and fully healed. Measurements today at 7  cm in the right buttock wound is unchanged from last week. 12/27/16; patient is on Bactrim and Augmentin for another week as directed by infectious disease. She has completed her IV antibiotics. She is not been systemically unwell no fever no chills. The area on her right buttock measured over 6 cm in depth. There is no palpable bone. No evidence of surrounding soft tissue infection. She is complaining of tongue irritation and has a history of thrush 01/04/17; patient is been back to see Dr. Sampson Goon, her Augmentin was stopped but he continued the Bactrim for another 3 weeks. Depth of the wound is 6.7 cm there is been no major change in either direction. She is not receive the wound VAC from home health I think because of confusion about who is supposed to provided will actually talk to the home health agency today [[facility] The net no major change. 01/18/17; patient obtained her wound VAC about 10 days ago however for some reason it was not actually put on  the wound. She is therefore here for [place] to apply this I guess. No other issues are noted. She is not complaining of pain fever drainage 02/01/17; patient is here now having the wound VAC or 3-4 weeks to a deep pressure area over the right initial tuberosity. This measures 6 cm in depth today which is about half a centimeter better than 2 weeks ago. There is no evidence she is systemically unwell no fever no chills no pain around the area. 02/15/17; I follow this patient every 2 weeks for a deep area over the right ischial tuberosity. This measures 5.5 cm today which is a continued improvement of 1.2 cm from 1/10 and down 0.5 cm from her visit 2 weeks ago 03/01/17; continued difficult area over the right initial tuberosity using the wound VAC. Depth today of 5.1 cm which is improved. Does not appear to be a lot of drainage in the canister. Antibiotics were finally stopped by Dr. Sampson Goon bactrim[]  and inflammatory markers have been  repeated 03/15/17; fall this lady every 2 weeks for a difficult area over her lower right gluteal area/ischial tuberosity. Depth today at 4.6 cm. This is a slow but steady improvement in the depth of this wound. Although we have labeled this as a pressure ulcer there may have been an underlying infection here at one point before we saw her. She had osteomyelitis on the left extensively which is since resolved 03/29/17- patient is here for follow-up evaluation of her right ischial pressure ulcer. She continues with the wound VAC and home health. According to the nurse home health has been using less foams and appropriate and we will instruct accordingly. The patient continues to smoke, approximately 10 cigarettes a day. She has been advised to decrease that in half by her next appointment, with a goal of complete cessation. She states her blood sugars have been consistently less than 150. She states that she spends most of her day position left lateral or prone. She does have an air mattress on her bed, she does not have an mattress for her chair, she states she cannot afford this. 04/12/17; patient is here for evaluation of her right ischial pressure ulcer. We continue to use a wound VAC with minimal improvement today the depth of this measuring 4.4 cm versus 4.6 cm 2 weeks ago. We are using a KCI wound VAC on this area. There is not excessive drainage no pain. The patient tells me she tries to keep off this in bed but is up in the wheelchair for 2 hours a day. She is limited in no her overall ambulation but is improving and apparently is getting bilateral lower extremity braces which she hopes will improve her ability to walk independently. 05/10/17; Depth at 3.3cm. Improved 05/24/17; depth at 3.5 cm. This is not improved since last time. Not clear if they are using collagen under the foam 06/07/17; depth that 2.9 which is a slight improvement. Still using the wound VAC. 06/21/17; depth is 2.9 cm which  is exactly the same as last time. Also the appearance of this wound is completely the same. We have been using silver collagen under a wound VAC. 07/05/17 patient presents today for reevaluation concerning her right Ischial wound. She had switched insurance companies and so it does appear that the Wound VAC needs to be reauthorized which we are working on this morning. Nonetheless her wound has been saying about the same we have continued to use the silver collagen underneath the wound VAC. She  has no discomfort. 07/19/17; patient follows every 2 weeks for her right if she'll wound. Since I last saw this a month ago her dimensions of come down to 2.6 cm. This is slightly down from a month ago when it was 2.9 cm in 4 months ago it was 4.6 cm. I note that there were insurance company issues with regard to the wound VAC which she apparently is not having on for several weeks now. I think it would be reasonable to change therapies here. Will use silver alginate rope 08/02/17 on evaluation today patient's wound actually appears to be doing significantly better in regard to the depth as compared to her previous evaluation. She is having no discomfort. Unfortunately she never got the Wound VAC reapproved [name], [name] ([id number]) following the insurance issues from several weeks ago. With that being said it does not appear that she needs to requires the Wound VAC anymore and in fact I feel that she is doing better and making greater improvements at this point without it. Fortunately she has no nausea, vomiting, diarrhea, fevers, or chills. 08/16/17; patient's wound depth down to 2.3 cm. She is using silver alginate packing rope. Note that her wound VAC was discontinued due to insurance issues. This is not particularly surprising. She has not been systemically unwell and has no other new complaints 08/30/17; no change in depth. Still at 2.3 cm. Still with the same gritty surface requiring debridement. I've been  using silver alginate for quite a period of time although this came down nicely in the last month 10/04/17; the depth of this is 2 cm however with careful inspection under high-intensity light most of the walls of this small probing sinus seemed to be normally epithelialized. I cannot exactly see the base of this however there is been no drainage. We've been using silver alginate there is no drainage on the dressing when it is removed. 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 There is no evidence of surrounding infection or pain. The patient had many review weakness in her bilateral legs. She is already been to see a neurologist who according the patient told her "you would never walk again" 11/01/17; this is a patient I last saw a month ago. At that point the linear tunnel in her right buttock was 2 cm. It was not possible to see the depth of this as skin had grown into the tunnel. In light of the fact that there was no drainage and no visible wound I recommended that we just allow her to go about her usual activity without dressing. She reports that almost immediately she noted drainage on her clothing although in spite of her instructions the contrary she did not come back to the clinic. She has not been specifically addressing this and is continued to no drainage without other symptoms either local or systemic 11/29/17; I follow this woman monthly. Last time the wound on her right buttock was 1.8 cm today measuring at 1.4 there has been gradual improvement in this. Concerning is the fact that the patient says that Dr. Sampson Goon of infectious disease changed her from doxycycline to Augmentin apparently because of the elevated inflammatory markers. She also said he did a culture of this area but she is not heard the results. I don't think I have his information available on care everywhere however I will check this. She is using Hydrofera Blue rope. The  patient and her intake nurse report that she is still having  identifiable drainage which certainly makes it clear that this is not closed. 01/02/17 on evaluation today patient appears to be doing fairly well in regard to her left gluteal wound. She has been continuing with the [facility] Dressing's here in our office. We actually see her once a month and then subsequently are performing weekly dressing changes with Hydrofera Blue Dressing rope during nurse visits one time a week. Today there really was not much drainage at this point. However this has happened previously where she had no drainage and was essentially thought to be close internally although the external had not completely pulled together. However then she reopened and began draining again. He has been mentioned the possibility of her seeing plastic surgery to try to get this finally and completely healed. 01/18/18 she is here in follow-up evaluation for right ischial pressure ulcer. She has an appointment with [facility] plastic surgery on 2/8 for evaluation. She continues to have minimal drainage at the tip of the dressing product. She continues to smoke, admits to 8-10 cigarettes per day. No change in treatment plan 02/06/18 on evaluation today patient appears to be doing about the same in regard to her right Ischial wound. She has been tolerating the dressing changes without complication. She did have her appointment at [facility] plastic surgery she was not really impressed. They stated that due to her weight they were unable to perform a flap and subsequently recommended the only thing they could attempt would be to exercise around the ulcer and then attempt to suture it together. The question is whether this would be effective or not. Patient really is not wanting to proceed down that road. Nonetheless she really have not changed much in regard to her wound measurements. 03/06/18 on evaluation today patient states that last week when I  perform the silver nitrate that she actually had some burning pain for about two days following. Since that time the area has been very dry and she states that it is also been very dark and scabbed around the area. With that being said other than this she feels that the hope was the fact that it was burning was a good sign and that they would be improvement but the really does not appear to be significant improvement at this point. We have tried a lot of different dressings for her most recently Hydrofera Blue Dressing, silver alginate rope, and collagen. We have never attempted Endoform which I think could be a possibility for her. 03/22/18-She is here for a follow-up evaluation of her right ischial pressure ulcer. There is no drainage on her dressings and what appears to be epidermal debris in the ulcer; she has no pain. The ulcer was cleanse with betadine, I believe it is healed, despite remaining a  1" depth. We will place a dry dressing over and evaluate next week for drainage, if no drainage she will be discharged at that time. 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 She underwent a prolonged course of IV antibiotics as directed by Dr. Sampson Goon. The tunneling right ischial pressure ulcer as eventually closed over. We've been following this over the last month or so with no identifiable area that Zurawski, Amirra L. ([id number]) was open. We've been using Endoform and hydrogel. She rising clinic today, our intake  nurse reported some dampness. READMISSION 11/14/18 This patient is a 57 year old woman who we've previously had in clinic for a very long period of time finally culminating in April 2019. At that point she had bilateral pressure ulcers over her ischial tuberosities  complicated by underlying osteomyelitis and a large tracking area that gave her wound in the left popliteal fossa. She required a prolonged course of IV antibiotics and wound care in the last wound on her right ischial tuberosity was closed out in April/19. She claims that this is still closed. We did not review this. The patient is a type II diabetic on insulin. She states that 3 months ago she noted loose skin on her heel which she pulled off. She then had a deeper area that she filed down with an instrument. She's been left with a wound on the left heel which she says is actually better and less deep. More recently she's been using peroxide and leaving it open to air. 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 We were not able to obtain ABIs in this clinic Wound History Patient reportedly has not tested positive for osteomyelitis. Patient reportedly has not had testing performed to evaluate circulation in the legs. Patient History Information obtained from Patient. Allergies Biaxin Family History Cancer - Mother, Diabetes - Father, Hypertension - Mother,Father, Stroke - Mother, No family history of Heart Disease, Kidney Disease, Lung Disease, Seizures, Thyroid Problems. Social History Former smoker, Marital Status - Divorced, Alcohol Use - Never, Drug Use - Prior History, Caffeine Use - Rarely. Medical History Hospitalization/Surgery History - 04/09/2016, [facility], [facility]. Review of Systems (ROS) Eyes Complains or has symptoms of Glasses / Contacts - glasses. Denies complaints or symptoms of Dry Eyes. Ear/Nose/Mouth/Throat Denies complaints or symptoms of Difficult clearing ears, Sinusitis. Hematologic/Lymphatic Denies complaints or symptoms of Bleeding / Clotting Disorders, Human Immunodeficiency Virus. Respiratory Denies complaints  or symptoms of Chronic or frequent coughs, Shortness of Breath. Cardiovascular Denies complaints or symptoms of Chest pain, LE edema. [name], [name] ([id number]) Objective Constitutional Sitting or standing Blood Pressure is within target range for patient.. Pulse regular and within target range for patient.Marland Kitchen Respirations regular, non-labored and within target range.. Temperature is normal and within the target range for the patient.Marland Kitchen appears in no distress. Vitals Time Taken: 8:58 AM, Temperature: 99.0 F, Pulse: 103 bpm, Respiratory Rate: 20 breaths/min, Blood Pressure: 136/68 mmHg. Eyes Conjunctivae clear. No discharge. Respiratory Respiratory effort is easy and symmetric bilaterally. Rate is normal at rest and on room air.. Bilateral breath sounds are clear and equal in all lobes with no wheezes, rales or rhonchi.. Cardiovascular Faintly palpable at the femoral. I could not feel popliteal pulses. Pedal pulses absent bilaterally.. Lymphatic None palpable in the popliteal or inguinal area on the left. Neurological Markedly reduced sensory. Psychiatric No evidence of depression, anxiety, or agitation. Calm, cooperative, and communicative. Appropriate interactions and affect.. General Notes: Wound exam; the area and questions on the plantar left heel. Very significant amount of necrotic surface debris and nonviable subcutaneous tissue removed with a #3 curet. Hemostasis with direct pressure. I then used a #15 blade to remove some overhanging skin The wound bed looks quite reasonable post debridement but will require ongoing debridement. There is no evidence of surrounding infection. Integumentary (Hair, Skin) No primary skin issue is seen. Wound #6 status is Open. Original cause of wound was Trauma. The wound is located on the Left,Lateral  Foot. The wound measures 2.2cm length x 2cm width x 0.3cm depth; 3.456cm^2 area and 1.037cm^3 volume. There is no tunneling noted, however, there  is undermining starting at 9:00 and ending at 11:00 with a maximum distance of 0.4cm. There is a none present amount of drainage noted. There is no granulation within the wound bed. There is a large (67-100%) amount of necrotic tissue within the wound bed including Eschar. The periwound skin appearance exhibited: Callus. The periwound skin appearance did not exhibit: Crepitus, Excoriation, Induration, Rash, Scarring, Dry/Scaly, Maceration, Atrophie Blanche, Cyanosis, Ecchymosis, Hemosiderin Staining, Mottled, Pallor, Rubor, Erythema. Assessment Active Problems ICD-10 Christen, Riniyah L. ([id number]) Type 2 diabetes mellitus with foot ulcer Non-pressure chronic ulcer of left heel and midfoot with fat layer exposed Type 2 diabetes mellitus with diabetic polyneuropathy Type 2 diabetes mellitus with diabetic peripheral angiopathy without gangrene Procedures Wound #6 Pre-procedure diagnosis of Wound #6 is a To be determined located on the Left,Lateral Foot . There was a Excisional Skin/Subcutaneous Tissue Debridement with a total area of 4.4 sq cm performed by Maxwell Caul, MD. With the following instrument(s): Curette to remove Viable tissue/material. Material removed includes Eschar and Subcutaneous Tissue and after achieving pain control using Lidocaine. No specimens were taken. A time out was conducted at 09:33, prior to the start of the procedure. A Minimum amount of bleeding was controlled with Pressure. The procedure was tolerated well with a pain level of Insensate throughout and a pain level of 0 following the procedure. Post Debridement Measurements: 2.2cm length x 2cm width x 0.9cm depth; 3.11cm^3 volume. Character of Wound/Ulcer Post Debridement is stable. Post procedure Diagnosis Wound #6: Same as Pre-Procedure Plan Wound Cleansing: Wound #6 Left,Lateral Foot: Clean wound with Normal Saline. Anesthetic (add to Medication List): Wound #6 Left,Lateral Foot: Topical Lidocaine 4%  cream applied to wound bed prior to debridement (In Clinic Only). Primary Wound Dressing: Wound #6 Left,Lateral Foot: Iodoflex Secondary Dressing: Wound #6 Left,Lateral Foot: Foam Conform/Kerlix Dressing Change Frequency: Wound #6 Left,Lateral Foot: Change Dressing Monday, Wednesday, Friday Follow-up Appointments: Wound #6 Left,Lateral Foot: Return Appointment in 1 week. Edema Control: Wound #6 Left,Lateral Foot: Elevate legs to the level of the heart and pump ankles as often as possible Off-Loading: Wound #6 Left,Lateral Foot: Other: - Keep pressure off of left heel Additional Orders / Instructions: Wound #6 Left,Lateral Foot: Activity as tolerated [name], [name] ([id number]) Home Health: Wound #6 Left,Lateral Foot: Initiate Home Health for Skilled Nursing Home Health Nurse may visit PRN to address patient s wound care needs. FACE TO FACE ENCOUNTER: MEDICARE and MEDICAID PATIENTS: I certify that this patient is under my care and that I had a face-to-face encounter that meets the physician face-to-face encounter requirements with this patient on this date. The encounter with the patient was in whole or in part for the following MEDICAL CONDITION: (primary reason for Home Healthcare) MEDICAL NECESSITY: I certify, that based on my findings, NURSING services are a medically necessary home health service. HOME BOUND STATUS: I certify that my clinical findings support that this patient is homebound (i.e., Due to illness or injury, pt requires aid of supportive devices such as crutches, cane, wheelchairs, walkers, the use of special transportation or the assistance of another person to leave their place of residence. There is a normal inability to leave the home and doing so requires considerable and taxing effort. Other absences are for medical reasons / religious services and are infrequent or of short duration when for other reasons). If current dressing  causes regression in wound  condition, may D/C ordered dressing product/s and apply Normal Saline Moist Dressing daily until next Wound Healing Center / Other MD appointment. Notify Wound Healing Center of regression in wound condition at [phone]. Please direct any NON-WOUND related issues/requests for orders to patient's Primary Care Physician Services and Therapies ordered were: Arterial Studies- Bilateral #1 after some thought I elected to go with Iodoflex to further debride the wound. #2 she is not independently transporting therefore she is legible for home health #3 her vascular exam suggests PAD and I'm going to order noninvasive arterial studies including ABIs, arterial Dopplers and TBIs #4 we offloaded her in a healing sandal. Concerns raised about anything else vis--vis her balance. She states she is able to walk 60 feet with a walker but then simply feels exhausted. Not really claudication but certainly could be possible. #5 the patient says the wound is better however we'll have to see how that goes. I did not order an x-ray but that may be necessary if we stall. Arterial studies were definitely indicated. #6 no evidence of infection no cultures were done Electronic Signature(s) Signed: 11/14/2018 5:31:58 PM By: Baltazar Najjar MD Entered By: Baltazar Najjar on 11/14/2018 09:53:00 Lucero, [name] ([id number]) -------------------------------------------------------------------------------- ROS/PFSH Details Patient Name: [name] Date of Service: 11/14/2018 8:45 AM Medical Record Number: [id number] Patient Account Number: [id number] Date of Birth/Sex: [birth year] (57 y.o. F) Treating RN: Rema Jasmine Primary Care Provider: Joen [name] Other Clinician: Referring Provider: Joen [name] Treating Provider/Extender: Altamese [place] in Treatment: 0 Information Obtained From Patient Wound History Do you currently have one or more open woundso No Have you tested positive for osteomyelitis (bone  infection)o No Have you had any tests for circulation on your legso No Eyes Complaints and Symptoms: Positive for: Glasses / Contacts - glasses Negative for: Dry Eyes Medical History: Negative for: Cataracts; Glaucoma; Optic Neuritis Ear/Nose/Mouth/Throat Complaints and Symptoms: Negative for: Difficult clearing ears; Sinusitis Medical History: Negative for: Chronic sinus problems/congestion; Middle ear problems Hematologic/Lymphatic Complaints and Symptoms: Negative for: Bleeding / Clotting Disorders; Human Immunodeficiency Virus Medical History: Negative for: Anemia; Hemophilia; Human Immunodeficiency Virus; Lymphedema; Sickle Cell Disease Respiratory Complaints and Symptoms: Negative for: Chronic or frequent coughs; Shortness of Breath Medical History: Positive for: Asthma Negative for: Aspiration; Chronic Obstructive Pulmonary Disease (COPD); Pneumothorax; Sleep Apnea; Tuberculosis Cardiovascular Complaints and Symptoms: Negative for: Chest pain; LE edema Medical History: Positive for: Hypertension Negative for: Angina; Arrhythmia; Congestive Heart Failure; Coronary Artery Disease; Deep Vein Thrombosis; Hypotension; [name], [name] ([id number]) Myocardial Infarction; Peripheral Arterial Disease; Peripheral Venous Disease; Phlebitis; Vasculitis Gastrointestinal Medical History: Negative for: Cirrhosis ; Colitis; Crohnos; Hepatitis A; Hepatitis B; Hepatitis C Endocrine Medical History: Positive for: Type II Diabetes Negative for: Type I Diabetes Time with diabetes: 30 yrs Treated with: Insulin Blood sugar tested every day: Yes Tested : Blood sugar testing results: Breakfast: 121 Genitourinary Medical History: Negative for: End Stage Renal Disease Immunological Medical History: Negative for: Lupus Erythematosus; Raynaudos; Scleroderma Integumentary (Skin) Medical History: Negative for: History of Burn; History of pressure wounds Musculoskeletal Medical  History: Negative for: Gout; Rheumatoid Arthritis; Osteoarthritis; Osteomyelitis Neurologic Medical History: Positive for: Neuropathy Negative for: Dementia; Quadriplegia; Paraplegia; Seizure Disorder Oncologic Medical History: Negative for: Received Chemotherapy; Received Radiation Psychiatric Medical History: Negative for: Anorexia/bulimia; Confinement Anxiety Immunizations Pneumococcal Vaccine: Received Pneumococcal Vaccination: No Gonsoulin, Molli L. ([id number]) Implantable Devices Hospitalization / Surgery History Name of Hospital Purpose of Hospitalization/Surgery Date [facility] AMS 04/09/2016 Family and Social History Cancer: Yes -  Mother; Diabetes: Yes - Father; Heart Disease: No; Hypertension: Yes - Mother,Father; Kidney Disease: No; Lung Disease: No; Seizures: No; Stroke: Yes - Mother; Thyroid Problems: No; Former smoker; Marital Status - Divorced; Alcohol Use: Never; Drug Use: Prior History; Caffeine Use: Rarely; Advanced Directives: No; Patient does not want information on Advanced Directives; Living Will: No; Medical Power of Attorney: No Electronic Signature(s) Signed: 11/14/2018 4:20:57 PM By: Rema Jasmine Signed: 11/14/2018 5:31:58 PM By: Baltazar Najjar MD Entered By: Rema Jasmine on 11/14/2018 09:16:40 Wardlow, [name] ([id number]) -------------------------------------------------------------------------------- SuperBill Details Patient Name: [name] Date of Service: 11/14/2018 Medical Record Number: [id number] Patient Account Number: [id number] Date of Birth/Sex: 04/17/1961 (57 y.o. F) Treating RN: Huel Coventry Primary Care Provider: Joen [name] Other Clinician: Referring Provider: Joen [name] Treating Provider/Extender: Altamese [place] in Treatment: 0 Diagnosis Coding ICD-10 Codes Code Description E11.621 Type 2 diabetes mellitus with foot ulcer L97.422 Non-pressure chronic ulcer of left heel and midfoot with fat layer exposed E11.42 Type 2 diabetes mellitus  with diabetic polyneuropathy E11.51 Type 2 diabetes mellitus with diabetic peripheral angiopathy without gangrene Facility Procedures CPT4 Code: 78295621 Description: 99213 - WOUND CARE VISIT-LEV 3 EST PT Modifier: Quantity: 1 CPT4 Code: 30865784 Description: 11042 - DEB SUBQ TISSUE 20 SQ CM/< ICD-10 Diagnosis Description L97.422 Non-pressure chronic ulcer of left heel and midfoot with fat Modifier: layer exposed Quantity: 1 Physician Procedures CPT4 Code Description: 6962952 [id number] - WC PHYS LEVEL 4 - EST PT ICD-10 Diagnosis Description L97.422 Non-pressure chronic ulcer of left heel and midfoot with fat E11.621 Type 2 diabetes mellitus with foot ulcer E11.51 Type 2 diabetes mellitus with  diabetic peripheral angiopathy E11.42 Type 2 diabetes mellitus with diabetic polyneuropathy Modifier: 25 layer exposed without gangrene Quantity: 1 CPT4 Code Description: [id number] - WC PHYS SUBQ TISS 20 SQ CM ICD-10 Diagnosis Description 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

## 2018-11-20 ENCOUNTER — Ambulatory Visit: Payer: Self-pay

## 2018-11-21 ENCOUNTER — Ambulatory Visit: Payer: Medicare HMO

## 2018-11-21 ENCOUNTER — Ambulatory Visit: Payer: Medicare HMO | Admitting: Family Medicine

## 2018-11-26 ENCOUNTER — Ambulatory Visit
Admission: RE | Admit: 2018-11-26 | Discharge: 2018-11-26 | Disposition: A | Discharge status: Home / Self Care | Payer: Medicare HMO | Source: Ambulatory Visit | Attending: Internal Medicine | Admitting: Internal Medicine

## 2018-11-26 DIAGNOSIS — I70201 Unspecified atherosclerosis of native arteries of extremities, right leg: Secondary | ICD-10-CM | POA: Insufficient documentation

## 2018-11-26 DIAGNOSIS — I7090 Unspecified atherosclerosis: Secondary | ICD-10-CM | POA: Diagnosis not present

## 2018-11-26 DIAGNOSIS — S91302A Unspecified open wound, left foot, initial encounter: Secondary | ICD-10-CM | POA: Diagnosis present

## 2018-11-26 DIAGNOSIS — X58XXXA Exposure to other specified factors, initial encounter: Secondary | ICD-10-CM | POA: Insufficient documentation

## 2018-11-26 DIAGNOSIS — S91301A Unspecified open wound, right foot, initial encounter: Secondary | ICD-10-CM | POA: Diagnosis present

## 2018-11-27 ENCOUNTER — Ambulatory Visit: Payer: Medicare HMO

## 2018-12-04 ENCOUNTER — Ambulatory Visit (INDEPENDENT_AMBULATORY_CARE_PROVIDER_SITE_OTHER): Payer: Medicare HMO | Admitting: Vascular Surgery

## 2018-12-04 ENCOUNTER — Encounter (INDEPENDENT_AMBULATORY_CARE_PROVIDER_SITE_OTHER): Payer: Self-pay

## 2018-12-04 ENCOUNTER — Encounter (INDEPENDENT_AMBULATORY_CARE_PROVIDER_SITE_OTHER): Payer: Self-pay | Admitting: Vascular Surgery

## 2018-12-04 VITALS — BP 118/78 | HR 110 | Resp 19 | Ht 63.0 in | Wt 246.0 lb

## 2018-12-04 DIAGNOSIS — I739 Peripheral vascular disease, unspecified: Secondary | ICD-10-CM | POA: Diagnosis not present

## 2018-12-04 DIAGNOSIS — F17200 Nicotine dependence, unspecified, uncomplicated: Secondary | ICD-10-CM

## 2018-12-04 DIAGNOSIS — L89620 Pressure ulcer of left heel, unstageable: Secondary | ICD-10-CM | POA: Diagnosis not present

## 2018-12-04 DIAGNOSIS — Z794 Long term (current) use of insulin: Secondary | ICD-10-CM

## 2018-12-04 DIAGNOSIS — E1122 Type 2 diabetes mellitus with diabetic chronic kidney disease: Secondary | ICD-10-CM

## 2018-12-04 DIAGNOSIS — N183 Chronic kidney disease, stage 3 (moderate): Secondary | ICD-10-CM

## 2018-12-04 NOTE — Progress Notes (Signed)
Subjective:    Patient ID: [name], female    DOB: 08/16/1961, 57 y.o.   MRN: [id number] Chief Complaint  Patient presents with  . New Patient (Initial Visit)    None healing wound   Presents as a new patient referred by Dr. Roxan Hockey from [facility] wound care center.  Patient notes a progressively worsening ulceration lately left heel which has been present for approximately 2 months.  The patient recently sought medical attention at the wound center.  The wound center ordered a bilateral ultrasound which was notable for: 11/26/18: Right Lower Extremity Inflow: Normal common femoral arterial waveforms and velocities. Minimal plaque in the common femoral artery. Outflow: Profunda femoral and superficial femoral artery waveforms are triphasic. Velocities are normal. Mild plaque visualized in the right SFA. The popliteal artery waveform is biphasic. Mild to moderate plaque is identified in the popliteal artery. Runoff: Anterior tibial and posterior tibial arteries demonstrate biphasic waveforms.  Left Lower Extremity Inflow: The visualized left common femoral artery appears likely occluded. Outflow: The left SFA is occluded at its origin with long segment chronic occlusion into the distal thigh. The distal segment of the SFA is open and demonstrates low velocity with monophasic waveform. There is some flow detected in the profunda femoral artery. The popliteal artery demonstrates monophasic waveform. Anterior tibial and posterior tibial arteries demonstrate low velocities and monophasic waveforms. Runoff: Normal posterior and anterior tibial arterial waveforms and velocities. Vessels are patent to the ankle.  The patient has severe peripheral neuropathy and is unable to feel her bilateral feet.  Patient does not ambulate much and denies any claudication-like symptoms or rest pain.  The patient does have chronic pain issues for which she is on opiate  medication.  The patient also has short-term/long-term memory issues.  Patient denies any fever, nausea or vomiting.  Review of Systems  Constitutional: Negative.   HENT: Negative.   Eyes: Negative.   Respiratory: Negative.   Cardiovascular: Positive for leg swelling.  Gastrointestinal: Negative.   Endocrine: Negative.   Genitourinary: Negative.   Musculoskeletal: Negative.   Skin: Positive for wound.  Allergic/Immunologic: Negative.   Neurological: Negative.   Hematological: Negative.   Psychiatric/Behavioral: Negative.       Objective:   Physical Exam  Constitutional: She is oriented to person, place, and time. She appears well-developed and well-nourished. No distress.  HENT:  Head: Normocephalic and atraumatic.  Right Ear: External ear normal.  Left Ear: External ear normal.  Mouth/Throat: Oropharynx is clear and moist.  Eyes: Pupils are equal, round, and reactive to light. Conjunctivae and EOM are normal.  Neck: Normal range of motion.  Cardiovascular: Normal rate, regular rhythm and normal heart sounds.  Pulses:      Radial pulses are 2+ on the right side, and 2+ on the left side.  Hard to palpate pedal pulses due to body habitus and edema however the bilateral feet are warm. Discoloration to the first toe on the left foot  Pulmonary/Chest: Effort normal and breath sounds normal.  Musculoskeletal: Normal range of motion. She exhibits edema (Moderate 1+ pitting edema noted bilaterally).  Neurological: She is alert and oriented to person, place, and time.  Skin: She is not diaphoretic.  Left heel: Noninfected ulceration noted. No cellulitis.  Psychiatric: She has a normal mood and affect. Her behavior is normal. Judgment and thought content normal.  Vitals reviewed.  BP 118/78 (BP Location: Left Arm, Patient Position: Sitting)   Pulse (!) 110   Resp  19   Ht 5\' 3"  (1.6 m)   Wt 246 lb (111.6 kg)   BMI 43.58 kg/m   Past Medical History:  Diagnosis Date  . Acute  pain of right shoulder 05/19/2016  . Acute PN (pyelonephritis) 05/18/2016  . Acute pyelonephritis 05/18/2016  . Anxiety   . Arthritis   . Asthma   . Diabetes mellitus without complication (HCC)   . GERD (gastroesophageal reflux disease)   . Glaucoma   . Hyperlipemia   . Hyperlipemia   . Hypertension    Social History   Socioeconomic History  . Marital status: Divorced    Spouse name: Not on file  . Number of children: Not on file  . Years of education: Not on file  . Highest education level: Not on file  Occupational History  . Not on file  Social Needs  . Financial resource strain: Patient refused  . Food insecurity:    Worry: Patient refused    Inability: Patient refused  . Transportation needs:    Medical: Patient refused    Non-medical: Patient refused  Tobacco Use  . Smoking status: Current Every Day Smoker    Packs/day: 0.50  . Smokeless tobacco: Never Used  Substance and Sexual Activity  . Alcohol use: No  . Drug use: Never  . Sexual activity: Not on file  Lifestyle  . Physical activity:    Days per week: Patient refused    Minutes per session: Patient refused  . Stress: Patient refused  Relationships  . Social connections:    Talks on phone: Patient refused    Gets together: Patient refused    Attends religious service: Patient refused    Active member of club or organization: Patient refused    Attends meetings of clubs or organizations: Patient refused    Relationship status: Patient refused  . Intimate partner violence:    Fear of current or ex partner: Patient refused    Emotionally abused: Patient refused    Physically abused: Patient refused    Forced sexual activity: Patient refused  Other Topics Concern  . Not on file  Social History Narrative  . Not on file   Past Surgical History:  Procedure Laterality Date  . ANTERIOR CRUCIATE LIGAMENT REPAIR     Family History  Problem Relation Age of Onset  . Diabetes Mother   . Heart disease  Mother   . Stroke Mother   . Kidney cancer Neg Hx   . Prostate cancer Neg Hx    Allergies  Allergen Reactions  . Biaxin [Clarithromycin] Rash    Patient states this medication gives her severe rash and thrush in the mouth.      Assessment & Plan:  Presents as a new patient referred by Dr. 05/20/2016 from [facility] wound care center.  Patient notes a progressively worsening ulceration lately left heel which has been present for approximately 2 months.  The patient recently sought medical attention at the wound center.  The wound center ordered a bilateral ultrasound which was notable for: 11/26/18: Right Lower Extremity Inflow: Normal common femoral arterial waveforms and velocities. Minimal plaque in the common femoral artery. Outflow: Profunda femoral and superficial femoral artery waveforms are triphasic. Velocities are normal. Mild plaque visualized in the right SFA. The popliteal artery waveform is biphasic. Mild to moderate plaque is identified in the popliteal artery. Runoff: Anterior tibial and posterior tibial arteries demonstrate biphasic waveforms.  Left Lower Extremity Inflow: The visualized left common femoral artery appears  likely occluded. Outflow: The left SFA is occluded at its origin with long segment chronic occlusion into the distal thigh. The distal segment of the SFA is open and demonstrates low velocity with monophasic waveform. There is some flow detected in the profunda femoral artery. The popliteal artery demonstrates monophasic waveform. Anterior tibial and posterior tibial arteries demonstrate low velocities and monophasic waveforms. Runoff: Normal posterior and anterior tibial arterial waveforms and velocities. Vessels are patent to the ankle.  The patient has severe peripheral neuropathy and is unable to feel her bilateral feet.  Patient does not ambulate much and denies any claudication-like symptoms or rest pain.  The patient  does have chronic pain issues for which she is on opiate medication.  The patient also has short-term/long-term memory issues.  Patient denies any fever, nausea or vomiting.  1. PAD (peripheral artery disease) (HCC) - New Patient with slow healing heel ulceration to the left lower extremity Patient with moderate to severe peripheral artery disease left lower extremity The patient is receiving local wound care from the [facility]'s wound center Unable to palpate pedal pulses on physical exam Patient with multiple risk factors for peripheral artery disease Recommend a left lower extremity angiogram with possible intervention to assess the patient's anatomy and degree of peripheral artery disease. If appropriate, an attempt at that time can be made to revascularize the leg Procedure, risks and benefits explained to the patient All questions answered The patient wishes to proceed  2. Pressure injury of left heel, unstageable (HCC) - New As above  3. Type 2 diabetes mellitus with stage 3 chronic kidney disease, with long-term current use of insulin (HCC) - Stable Encouraged good control as its slows the progression of atherosclerotic disease  Current Outpatient Medications on File Prior to Visit  Medication Sig Dispense Refill  . albuterol (PROAIR HFA) 108 (90 Base) MCG/ACT inhaler     . Ascorbic Acid (VITAMIN C) 1000 MG tablet Take 1,000 mg by mouth daily.    Marland Kitchen atorvastatin (LIPITOR) 20 MG tablet Take 20 mg by mouth at bedtime.  12  . Calcium Carbonate-Vitamin D3 (CALCIUM 600-D) 600-400 MG-UNIT TABS Take by mouth 2 (two) times daily.    . cholecalciferol (VITAMIN D) 1000 units tablet Take 5,000 Units by mouth daily.    . DULoxetine (CYMBALTA) 20 MG capsule Take 60 mg by mouth daily.     . fluticasone (FLONASE) 50 MCG/ACT nasal spray     . furosemide (LASIX) 20 MG tablet Take 1 tablet (20 mg total) by mouth daily. 30 tablet 0  . gabapentin (NEURONTIN) 600 MG tablet Take  1,200 mg by mouth 3 (three) times daily.     . insulin glargine (LANTUS) 100 UNIT/ML injection Inject 70 Units into the skin daily.    . insulin lispro (HUMALOG) 100 UNIT/ML injection Inject 15 Units into the skin 3 (three) times daily before meals.     . latanoprost (XALATAN) 0.005 % ophthalmic solution Place 1 drop into both eyes at bedtime. 2.5 mL 0  . metFORMIN (GLUCOPHAGE-XR) 500 MG 24 hr tablet Take 1 tablet by mouth 2 (two) times daily.  2  . metoprolol tartrate (LOPRESSOR) 25 MG tablet Take 1 tablet (25 mg total) by mouth 2 (two) times daily. 60 tablet 0  . nortriptyline (PAMELOR) 50 MG capsule     . NOVOLOG FLEXPEN 100 UNIT/ML FlexPen     . oxybutynin (DITROPAN-XL) 10 MG 24 hr tablet Take 10 mg by mouth at bedtime.  3  .  oxyCODONE-acetaminophen (PERCOCET) 10-325 MG tablet Take 1 tablet by mouth every 6 (six) hours as needed for pain.    . pantoprazole (PROTONIX) 20 MG tablet Take 20 mg by mouth daily.    . traZODone (DESYREL) 50 MG tablet Take 1 tablet (50 mg total) by mouth at bedtime. 30 tablet 0   No current facility-administered medications on file prior to visit.    There are no Patient Instructions on file for this visit. No follow-ups on file.  Delynda Sepulveda A Janelie Goltz, PA-C

## 2018-12-05 ENCOUNTER — Encounter: Payer: Medicare HMO | Attending: Internal Medicine | Admitting: Internal Medicine

## 2018-12-05 DIAGNOSIS — E11621 Type 2 diabetes mellitus with foot ulcer: Secondary | ICD-10-CM | POA: Diagnosis present

## 2018-12-05 DIAGNOSIS — Z794 Long term (current) use of insulin: Secondary | ICD-10-CM | POA: Diagnosis not present

## 2018-12-05 DIAGNOSIS — E1151 Type 2 diabetes mellitus with diabetic peripheral angiopathy without gangrene: Secondary | ICD-10-CM | POA: Diagnosis not present

## 2018-12-05 DIAGNOSIS — Z79899 Other long term (current) drug therapy: Secondary | ICD-10-CM | POA: Diagnosis not present

## 2018-12-05 DIAGNOSIS — E1142 Type 2 diabetes mellitus with diabetic polyneuropathy: Secondary | ICD-10-CM | POA: Diagnosis not present

## 2018-12-05 DIAGNOSIS — J449 Chronic obstructive pulmonary disease, unspecified: Secondary | ICD-10-CM | POA: Diagnosis not present

## 2018-12-05 DIAGNOSIS — F17210 Nicotine dependence, cigarettes, uncomplicated: Secondary | ICD-10-CM | POA: Diagnosis not present

## 2018-12-05 DIAGNOSIS — L97422 Non-pressure chronic ulcer of left heel and midfoot with fat layer exposed: Secondary | ICD-10-CM | POA: Diagnosis not present

## 2018-12-05 DIAGNOSIS — K219 Gastro-esophageal reflux disease without esophagitis: Secondary | ICD-10-CM | POA: Diagnosis not present

## 2018-12-05 DIAGNOSIS — L89314 Pressure ulcer of right buttock, stage 4: Secondary | ICD-10-CM | POA: Diagnosis not present

## 2018-12-05 DIAGNOSIS — L03116 Cellulitis of left lower limb: Secondary | ICD-10-CM | POA: Insufficient documentation

## 2018-12-06 ENCOUNTER — Ambulatory Visit: Payer: Self-pay

## 2018-12-07 NOTE — Progress Notes (Signed)
[name], [name] ([id number]) Visit Report for 12/05/2018 Debridement Details Patient Name: Katelyn, [name] Date of Service: 12/05/2018 9:30 AM Medical Record Number: [id number] Patient Account Number: [id number] Date of Birth/Sex: January 20, 1961 (57 y.o. F) Treating RN: Huel Coventry Primary Care Provider: Joen [name] Other Clinician: Referring Provider: Joen [name] Treating Provider/Extender: Altamese [place] in Treatment: 3 Debridement Performed for Wound #6 Left,Lateral Foot Assessment: Performed By: Physician Maxwell Caul, MD Debridement Type: Chemical/Enzymatic/Mechanical Agent Used: Santyl Level of Consciousness (Pre- Awake and Alert procedure): Pre-procedure Verification/Time Yes - 10:10 Out Taken: Start Time: 10:10 Pain Control: Lidocaine Instrument: Other : tongue blade Bleeding: None Hemostasis Achieved: Pressure End Time: 10:12 Response to Treatment: Procedure was tolerated well Level of Consciousness Awake and Alert (Post-procedure): Post Debridement Measurements of Total Wound Length: (cm) 2.7 Width: (cm) 2.5 Depth: (cm) 0.4 Volume: (cm) 2.121 Character of Wound/Ulcer Post Debridement: Stable Post Procedure Diagnosis Same as Pre-procedure Electronic Signature(s) Signed: 12/05/2018 4:51:45 PM By: Baltazar Najjar MD Signed: 12/05/2018 5:09:03 PM By: Elliot Gurney, BSN, RN, CWS, Kim RN, BSN Entered By: Elliot Gurney, BSN, RN, CWS, Kim on 12/05/2018 10:12:16 Geerdes, [name] ([id number]) -------------------------------------------------------------------------------- HPI Details Patient Name: [name] Date of Service: 12/05/2018 9:30 AM Medical Record Number: [id number] Patient Account Number: [id number] Date of Birth/Sex: December 27, 1960 (57 y.o. F) Treating RN: Huel Coventry Primary Care Provider: Joen [name] Other Clinician: Referring Provider: Joen [name] Treating Provider/Extender: Altamese  in Treatment: 3 History of Present Illness HPI  Description: 57 year old patient who was seen by visiting [name] wound care specialist for a wound on both her buttock and was found to have an unstageable wound on the right buttock for about 2 months. I understand that she had a fall and was laying on the floor for about 48 hours before she was found and taken to the ICU and had a long injury to her gluteal area from pressure and also had broken her right humerus. She has had a right proximal humerus fracture and has been followed up with orthopedics recently. The patient has a past medical history of type 2 diabetes mellitus, paraparesis, acute pyelonephritis, GERD, hypertension, glaucoma, chronic pain, anxiety neurosis, nicotine dependence, COPD. the patient had some debridement done and was to operative was recommended to use Silvadene dressing and offloading. She is a smoker and occasionally smokes a few cigarettes. the patient requested a second opinion for months and is here to discuss her care. 09/21/16; the patient re-presents from home today for review of 3 different wounds. I note that she was seen in the clinic here in July at which time she had bilateral buttock wounds. It was apparently suggested at that time that she use a wound VAC bridged to both wounds just near the initial tuberosity's bilaterally which she refused. The history was a bit difficult to put together. Apparently this patient became ill at the end of April of this year. She was found sitting on the floor she had apparently been for 2 days and subsequently admitted to hospital from 04/23/16 through 05/02/16 and at that point she was critically ill ultimately having sepsis secondary to UTI, nontraumatic rhabdomyolysis and diabetic ketoacidosis. She had acute renal failure and I think required ICU care including intubation. Patient states her wounds actually started at that point on the bilateral issue tuberosities however in reviewing the discharge summary from 5/8 I see  no reference to wounds at that point. It did state that she had left lower extremity cellulitis however. Reviewing Epic I see  no relevant x-rays. It would appear that her discharge creatinine was within the normal range and indeed on 9/15 her creatinine has remained normal. [facility] There the wounds on her bilateral Buttocks were dressed. Only just before her discharge from the nursing facility she developed an "knot" which was interpreted as cellulitis on the posterior aspect of her left knee she was given antibiotics. Apparently sometime late in July a this actually opened and became a wound at home health care was tending to however she is still having purulent drainage coming from this and by my understanding the wound depth is actually become unmeasurable. I am not really clear about what home health has been placing in any of these wound areas. The patient states that is something with silver and it. She is not been systemically unwell no fever or chills her appetite is good. She is a diabetic poorly controlled however she states that her recent blood sugars at home have been in the low to mid 100s. 09/28/16 On evaluation today patient appears to continue to exhibit the 3 areas of ulceration that were noted previous. She did have an x-ray of the right pelvis which showed evidence of potential soft tissue infection but no obvious osteomyelitis. There was a discussion last office visit concerning the possibility of a wound VAC. Witth that being said the x-ray report suggested that an MRI may be more appropriate to further evaluate the extent. Subsequently in regard to the wound over the popliteal portion of the left lower extremity with tunneling at 12:00 the CT scan that was ordered was denied by insurance as they state the patient has not had x-rays prior to advanced imaging. Patient states that she is frustrated with the situation  overall. 10/05/16 in the interval since I last saw this patient last week she has had the x-ray of the knee performed. I did review that x-ray today and fortunately shows no evidence of osteomyelitis or other acute abnormality at this point in time. She continues to have the opening iin the posterior left popliteal space with tracking proximal up the posterior thigh. Nothing seems to have worsened but it also seems to have not improved. The same is true in regard to the right pressure ulcer over the gluteal region which extends toward the ischium. The left gluteal pressure ulcer actually appears to be doing somewhat better my opinion there is some necrotic slough but overall this appears fairly well. She tells me thatt she has some discomfort especially when home health is helping her with dressing changes as they do not know her. At worse she rates her pain to be a 5 out of 10 right now it's more like a 1 out of 10. Snader, Caidyn L. ([id number]) 10/07/16; still the patient has 3 different wound areas. She has a deep stage IV wound over her right ischial tuberosity. She is due to have an MRI next week. The wound over her left ischial tuberosity is more superficial and underwent debridement today. Finally she has a small open area in her left popliteal fossa the probes on measurably forward superiorly. Still a lot of drainage coming out of this. The CT scan that I ordered 3 weeks ago was questioned by her insurance company wanting a plain x-ray first. As I understand things result of this is nothing has been done in 3 weeks in terms of imaging the thigh and she has an MRI booked of this along with  her pelvis for next week line 10/12/16; patient has a deep probing wound over the right ischiall tuberosity, stage III wound over the left visual tuberosity and a draining sinus in her left popliteal fossa. None of this much different from when I saw this 3 weeks ago. We have been using silver rope to the  right ischial wound and a draining area in the left popliteal fossa. Plain silver alginate to the area on the left ischial tuberosity 10/19/16; the patient's wounds are essentially unchanged although the area on the left lower gluteal is actually improved. Our intake nurse noted drainage from the right initial tuberosity probing wound as well as the draining area in the left popliteal fossa. Both of these were cultured. She had x-rays I think at the insistence of her insurance company on 09/23/16 x-ray of the pelvis was not particularly helpful she did have soft tissue air over the right lower pelvis although with the depth of this wound this is not surprising. An x-ray of her left knee did not show any specific abnormalities. We are still using silver alginate to these wound areas. Her MRI is booked for 10/27 10/26/16; cultures of the purulent drainage in her right initial tuberosity wound grew moderate Proteus and few staph aureus. The same organisms were cultured out of the left knee sinus tract posteriorly. The staph aureus is MRSA. I had started her on Augmentin last week I added doxycycline. The MRI of the left lower extremity and pelvis was finally done. The MRI of the femur showed subcutaneous soft tissue swelling edema fluid and myositis in the vastus lateralis muscle but no soft tissue abscess septic arthritis or osteomyelitis. MRI of the pelvis showed the left wound to be more expensive extending down to the bone there was osteomyelitis. Left hamstring tendons were also involved. No septic arthritis involving the hip. The decubitus ulcer on the right side showed no definite osteomyelitis or abscess.. The right hip wound is actually the one the probes 6 cm downward. But the MRI showing infection including osteomyelitis on the left explains the draining sinus in the popliteal fossa on the left. She did have antibiotics in the hospitalization last time and this extended into her nursing home  stay but I'm not exactly sure what antibiotics and for what duration. According the patient this did include vancomycin with considerable effort of our staff we are able to get the patient into see Dr. Sampson Goon today. There were transportation difficulties. Her mother had open heart surgery and is in the ICU in [place] therefore her brother was unable to transport. Dr. Jarrett Ables office graciously arranged time to see her today. From my point of view she is going to require IV vancomycin plus perhaps a third generation cephalosporin. I plan to keep her on doxycycline and Augmentin until the IV antibiotics can be arranged. 11/02/2016 - Posey presents today for management of ulcers; She saw Dr. 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 She is to have the PICC line placed today and receive the initial dose for both antibiotics today. She has yet to receive the offloading chair cushion and/or mattress overlay from home health, apparently this has been a 3 week process. I encouraged her to speak to home health regarding this matter, along with offering home health to contact the wouns care center with any questions or concerns. The left ischial pressure ulcer continues to imporve, while to right ischial ulcer has  increased in depth. The popliteal fossa sinus tract remains unmeasurable due to the limitation of depth measurement (tract extends beyond our measuring devices). 11-16-2016 Ms. Huhn presents today for evaluation and management of bilateral ischial stage IV pressure ulcers and sinus tract to the left popliteal fossa. she is under the care of Dr. Sampson Goon for IV antibiotic therapy; she states that the vancomycin was placed on hold and will be restarted at a lower dose based on her renal function. She continues taking Zosyn in addition to the vancomycin. She also states that she has yet to  receive offloading cushions from home health, according to her she does not qualify for these offloading cushions because "the ulcers are unstageable ". We will contact the home health agency today to lend clarity regarding her pressure ulcers. The left popliteal fossa sinus tract continues to be a measurable as it extends beyond the length of our measuring devices.. 11/30/16; the patient is still on vancomycin and Zosyn. The depth of the draining sinus behind her left popliteal fossa is down to 4 cm although there is still serosanguineous drainage coming out of this. She saw Dr. Sampson Goon of infectious disease yesterday the idea is to weeks more of IV antibiotics and then oral antibiotics although I have not read his note. The area on the left gluteal fold is just about healed. She has a 6 cm draining sinus over the right initial tuberosity although I cannot feel bone at the base of this. As far as the patient is aware she has not had a recheck of her inflammatory markers. 12/07/16; patient is on vancomycin and Zosyn appointment with Dr. Sampson Goon on the 19th at which point the patient expects to have a change in antibiotics. Remarkable improvement over the wound over the left ischial tuberosity which is just about closed. The draining sinus in her popliteal fossa has 0.4 cm in depth. The area on the right ischial tuberosity still probes down 7 cm. This is closed and overall wound dimensions but not depth. 12/14/16; patient is completing her vancomycin and Zosyn and per her she is going to transition to Bactrim and Augmentin for another 3 weeks. The area on her left gluteal fold is closed except for some skin tears. The area behind her left knee is no longer has any depth. The only remaining area that is of clinical concern is on the right gluteal fold probing towards the right Noll, Taiz L. ([id number]) ischial tuberosity. Today this measures 6.9 cm in depth. Very gritty surface 12/21/16;  patient is now on Bactrim and Augmentin as directed by infectious disease. This should be for another 2 weeks. The area in her left gluteal fold and left popliteal are closed over and fully healed. Measurements today at 7 cm in the right buttock wound is unchanged from last week. 12/27/16; patient is on Bactrim and Augmentin for another week as directed by infectious disease. She has completed her IV antibiotics. She is not been systemically unwell no fever no chills. The area on her right buttock measured over 6 cm in depth. There is no palpable bone. No evidence of surrounding soft tissue infection. She is complaining of tongue irritation and has a history of thrush 01/04/17; patient is been back to see Dr. Sampson Goon, her Augmentin was stopped but he continued the Bactrim for another 3 weeks. Depth of the wound is 6.7 cm there is been no major change in either direction. She is not receive the wound VAC from home health I think  because of confusion about who is supposed to provided will actually talk to the home health agency today [[facility] The net no major change. 01/18/17; patient obtained her wound VAC about 10 days ago however for some reason it was not actually put on the wound. She is therefore here for [place] to apply this I guess. No other issues are noted. She is not complaining of pain fever drainage 02/01/17; patient is here now having the wound VAC or 3-4 weeks to a deep pressure area over the right initial tuberosity. This measures 6 cm in depth today which is about half a centimeter better than 2 weeks ago. There is no evidence she is systemically unwell no fever no chills no pain around the area. 02/15/17; I follow this patient every 2 weeks for a deep area over the right ischial tuberosity. This measures 5.5 cm today which is a continued improvement of 1.2 cm from 1/10 and down 0.5 cm from her visit 2 weeks ago 03/01/17; continued difficult area over the right initial tuberosity using the  wound VAC. Depth today of 5.1 cm which is improved. Does not appear to be a lot of drainage in the canister. Antibiotics were finally stopped by Dr. Sampson Goon bactrim[]  and inflammatory markers have been repeated 03/15/17; fall this lady every 2 weeks for a difficult area over her lower right gluteal area/ischial tuberosity. Depth today at 4.6 cm. This is a slow but steady improvement in the depth of this wound. Although we have labeled this as a pressure ulcer there may have been an underlying infection here at one point before we saw her. She had osteomyelitis on the left extensively which is since resolved 03/29/17- patient is here for follow-up evaluation of her right ischial pressure ulcer. She continues with the wound VAC and home health. According to the nurse home health has been using less foams and appropriate and we will instruct accordingly. The patient continues to smoke, approximately 10 cigarettes a day. She has been advised to decrease that in half by her next appointment, with a goal of complete cessation. She states her blood sugars have been consistently less than 150. She states that she spends most of her day position left lateral or prone. She does have an air mattress on her bed, she does not have an mattress for her chair, she states she cannot afford this. 04/12/17; patient is here for evaluation of her right ischial pressure ulcer. We continue to use a wound VAC with minimal improvement today the depth of this measuring 4.4 cm versus 4.6 cm 2 weeks ago. We are using a [employer] wound VAC on this area. There is not excessive drainage no pain. The patient tells me she tries to keep off this in bed but is up in the wheelchair for 2 hours a day. She is limited in no her overall ambulation but is improving and apparently is getting bilateral lower extremity braces which she hopes will improve her ability to walk independently. 05/10/17; Depth at 3.3cm. Improved 05/24/17; depth at 3.5 cm.  This is not improved since last time. Not clear if they are using collagen under the foam 06/07/17; depth that 2.9 which is a slight improvement. Still using the wound VAC. 06/21/17; depth is 2.9 cm which is exactly the same as last time. Also the appearance of this wound is completely the same. We have been using silver collagen under a wound VAC. 07/05/17 patient presents today for reevaluation concerning her right Ischial wound. She had switched  insurance companies and so it does appear that the Wound VAC needs to be reauthorized which we are working on this morning. Nonetheless her wound has been saying about the same we have continued to use the silver collagen underneath the wound VAC. She has no discomfort. 07/19/17; patient follows every 2 weeks for her right if she'll wound. Since I last saw this a month ago her dimensions of come down to 2.6 cm. This is slightly down from a month ago when it was 2.9 cm in 4 months ago it was 4.6 cm. I note that there were insurance company issues with regard to the wound VAC which she apparently is not having on for several weeks now. I think it would be reasonable to change therapies here. Will use silver alginate rope 08/02/17 on evaluation today patient's wound actually appears to be doing significantly better in regard to the depth as compared to her previous evaluation. She is having no discomfort. Unfortunately she never got the Wound VAC reapproved following the insurance issues from several weeks ago. With that being said it does not appear that she needs to requires the Wound VAC anymore and in fact I feel that she is doing better and making greater improvements at this point without it. Fortunately she has no nausea, vomiting, diarrhea, fevers, or chills. 08/16/17; patient's wound depth down to 2.3 cm. She is using silver alginate packing rope. Note that her wound VAC was discontinued due to insurance issues. This is not particularly surprising. She  has not been systemically unwell and has no other new complaints 08/30/17; no change in depth. Still at 2.3 cm. Still with the same gritty surface requiring debridement. I've been using silver Canlas, Lydia L. ([id number]) alginate for quite a period of time although this came down nicely in the last month 10/04/17; the depth of this is 2 cm however with careful inspection under high-intensity light most of the walls of this small probing sinus seemed to be normally epithelialized. I cannot exactly see the base of this however there is been no drainage. We've been using silver alginate there is no drainage on the dressing when it is removed. 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 There is no evidence of surrounding infection or pain. The patient had many review weakness in her bilateral legs. She is already been to see a neurologist who according the patient told her "you would never walk again" 11/01/17; this is a patient I last saw a month ago. At that point the linear tunnel in her right buttock was 2 cm. It was not possible to see the depth of this as skin had grown into the tunnel. In light of the fact that there was no drainage and no visible wound I recommended that we just allow her to go about her usual activity without dressing. She reports that almost immediately she noted drainage on her clothing although in spite of her instructions the contrary she did not come back to the clinic. She has not been specifically addressing this and is continued to no drainage without other symptoms either local or systemic 11/29/17; I follow this woman monthly. Last time the wound on her right buttock was 1.8 cm today measuring at 1.4 there has been gradual improvement in this. Concerning is the fact that the patient says that Dr. Sampson Goon of infectious disease changed her from doxycycline to Augmentin apparently because of the elevated inflammatory markers. She  also said he did a culture  of this area but she is not heard the results. I don't think I have his information available on care everywhere however I will check this. She is using Hydrofera Blue rope. The patient and her intake nurse report that she is still having identifiable drainage which certainly makes it clear that this is not closed. 01/02/17 on evaluation today patient appears to be doing fairly well in regard to her left gluteal wound. She has been continuing with the [facility] Dressing's here in our office. We actually see her once a month and then subsequently are performing weekly dressing changes with Hydrofera Blue Dressing rope during nurse visits one time a week. Today there really was not much drainage at this point. However this has happened previously where she had no drainage and was essentially thought to be close internally although the external had not completely pulled together. However then she reopened and began draining again. He has been mentioned the possibility of her seeing plastic surgery to try to get this finally and completely healed. 01/18/18 she is here in follow-up evaluation for right ischial pressure ulcer. She has an appointment with [facility] plastic surgery on 2/8 for evaluation. She continues to have minimal drainage at the tip of the dressing product. She continues to smoke, admits to 8-10 cigarettes per day. No change in treatment plan 02/06/18 on evaluation today patient appears to be doing about the same in regard to her right Ischial wound. She has been tolerating the dressing changes without complication. She did have her appointment at [facility] plastic surgery she was not really impressed. They stated that due to her weight they were unable to perform a flap and subsequently recommended the only thing they could attempt would be to exercise around the ulcer and then attempt to suture it together. The question is whether this would be effective or not.  Patient really is not wanting to proceed down that road. Nonetheless she really have not changed much in regard to her wound measurements. 03/06/18 on evaluation today patient states that last week when I perform the silver nitrate that she actually had some burning pain for about two days following. Since that time the area has been very dry and she states that it is also been very dark and scabbed around the area. With that being said other than this she feels that the hope was the fact that it was burning was a good sign and that they would be improvement but the really does not appear to be significant improvement at this point. We have tried a lot of different dressings for her most recently Hydrofera Blue Dressing, silver alginate rope, and collagen. We have never attempted Endoform which I think could be a possibility for her. 03/22/18-She is here for a follow-up evaluation of her right ischial pressure ulcer. There is no drainage on her dressings and what appears to be epidermal debris in the ulcer; she has no pain. The ulcer was cleanse with betadine, I believe it is healed, despite remaining a  1" depth. We will place a dry dressing over and evaluate next week for drainage, if no drainage she will be discharged at that time. 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 She underwent a prolonged course of IV antibiotics as directed by Dr.  Fitzgerald. The tunneling right ischial pressure ulcer as eventually closed over. We've been following this over the last month or so with no identifiable area that was open. We've been using Endoform and hydrogel. She rising clinic today, our intake nurse reported some dampness. READMISSION 11/14/18 This patient is a 57 year old woman who  we've previously had in clinic for a very long period of time finally culminating in April 2019. At that point she had bilateral pressure ulcers over her ischial tuberosities complicated by underlying osteomyelitis and Stegemann, Gearl L. ([id number]) a large tracking area that gave her wound in the left popliteal fossa. She required a prolonged course of IV antibiotics and wound care in the last wound on her right ischial tuberosity was closed out in April/19. She claims that this is still closed. We did not review this. The patient is a type II diabetic on insulin. She states that 3 months ago she noted loose skin on her heel which she pulled off. She then had a deeper area that she filed down with an instrument. She's been left with a wound on the left heel which she says is actually better and less deep. More recently she's been using peroxide and leaving it open to air. 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 We were not able to obtain ABIs in this clinic 12/05/18; it's been 3 weeks since I saw this patient. Since then she went for her noninvasive arterial studies. On the left the patient had an SFA that was occluded at its origin with long segment chronic occlusion into the thigh. The distal segment of the SFA is open monophasic waveforms. There is some flow in the profunda femoral artery. The popliteal artery is monophasic anterior tibial and posterior tibials demonstrate low velocities in monophasic waveforms. The patient is a known diabetic. She is going for angiogram on 12/30. The wound is really deteriorated somewhat. There is black eschar. The wound is deeper. She has some erythema around the wound spreading above the lateral heel. There is also a small cyst that I don't remember seeing. This was not obviously infectious and I didn't  open this today. She is going to need antibiotics she tells me she has doxycycline at home which is reasonable. Electronic Signature(s) Signed: 12/05/2018 4:51:45 PM By: Baltazar Najjar MD Entered By: Baltazar Najjar on 12/05/2018 10:21:30 Pattison, [name] ([id number]) -------------------------------------------------------------------------------- Physical Exam Details Patient Name: [name] Date of Service: 12/05/2018 9:30 AM Medical Record Number: [id number] Patient Account Number: [id number] Date of Birth/Sex: [birth year] (57 y.o. F) Treating RN: Huel Coventry Primary Care Provider: Joen [name] Other Clinician: Referring Provider: Joen [name] Treating Provider/Extender: Altamese [place] in Treatment: 3 Constitutional Sitting or standing Blood Pressure is within target range for patient.. Pulse regular and within target range for patient.Marland Kitchen Respirations regular, non-labored and within target range.. Temperature is normal and within the target range for the patient.Marland Kitchen appears in no distress.She does not appear to be systemically unwell. Eyes Conjunctivae clear. No discharge. Respiratory Respiratory effort is easy and symmetric bilaterally. Rate is normal at rest and on room air.. Cardiovascular Popliteal pulses absent on the left but palpable at the left inguinal. Pedal pulses Absent on the left. Lymphatic None palpable in the left popliteal or inguinal area. Integumentary (Hair, Skin) Concerning erythema to appear really above the wound. I have marked this area. Notes When exam; plantar left heel towards the lateral  aspect. This does not look healthy. Dry pale-looking tissue probably dry gangrene. Also concerning is some erythema is spreading above this into the lateral heel. She is insensate therefore does not feel pain. This could be ongoing ischemia as well but I felt antibiotics were necessary. There is also a cyst or soft raised area above this. This is not obviously a  pustule and I was not prepared to debride this. Electronic Signature(s) Signed: 12/05/2018 4:51:45 PM By: Baltazar Najjar MD Entered By: Baltazar Najjar on 12/05/2018 10:25:36 Hoque, [name] ([id number]) -------------------------------------------------------------------------------- Physician Orders Details Patient Name: [name] Date of Service: 12/05/2018 9:30 AM Medical Record Number: [id number] Patient Account Number: [id number] Date of Birth/Sex: 1961/01/13 (57 y.o. F) Treating RN: Huel Coventry Primary Care Provider: Joen [name] Other Clinician: Referring Provider: Joen [name] Treating Provider/Extender: Altamese [place] in Treatment: 3 Verbal / Phone Orders: No Diagnosis Coding Wound Cleansing Wound #6 Left,Lateral Foot o Clean wound with Normal Saline. Anesthetic (add to Medication List) Wound #6 Left,Lateral Foot o Topical Lidocaine 4% cream applied to wound bed prior to debridement (In Clinic Only). Primary Wound Dressing Wound #6 Left,Lateral Foot o Santyl Ointment Secondary Dressing Wound #6 Left,Lateral Foot o Conform/Kerlix - Heel cup Dressing Change Frequency Wound #6 Left,Lateral Foot o Change Dressing Monday, Wednesday, Friday - Homehealth Follow-up Appointments Wound #6 Left,Lateral Foot o Return Appointment in 1 week. Edema Control Wound #6 Left,Lateral Foot o Elevate legs to the level of the heart and pump ankles as often as possible Off-Loading Wound #6 Left,Lateral Foot o Other: - Keep pressure off of left heel Additional Orders / Instructions Wound #6 Left,Lateral Foot o Stop Smoking Home Health Wound #6 Left,Lateral Foot o Continue Home Health Visits - [facility] o Home Health Nurse may visit PRN to address patientos wound care needs. IZADORA, ROEHR ([id number]) o FACE TO FACE ENCOUNTER: MEDICARE and MEDICAID PATIENTS: I certify that this patient is under my care and that I had a face-to-face encounter that  meets the physician face-to-face encounter requirements with this patient on this date. The encounter with the patient was in whole or in part for the following MEDICAL CONDITION: (primary reason for Home Healthcare) MEDICAL NECESSITY: I certify, that based on my findings, NURSING services are a medically necessary home health service. HOME BOUND STATUS: I certify that my clinical findings support that this patient is homebound (i.e., Due to illness or injury, pt requires aid of supportive devices such as crutches, cane, wheelchairs, walkers, the use of special transportation or the assistance of another person to leave their place of residence. There is a normal inability to leave the home and doing so requires considerable and taxing effort. Other absences are for medical reasons / religious services and are infrequent or of short duration when for other reasons). o If current dressing causes regression in wound condition, may D/C ordered dressing product/s and apply Normal Saline Moist Dressing daily until next Wound Healing Center / Other MD appointment. Notify Wound Healing Center of regression in wound condition at [phone]. o Please direct any NON-WOUND related issues/requests for orders to patient's Primary Care Physician Medications-please add to medication list. Wound #6 Left,Lateral Foot o P.O. Antibiotics - Doxycycline 100mg  BID for 10 days Services and Therapies o Angiogram, peripheral - December 24, 2018 Electronic Signature(s) Signed: 12/05/2018 4:51:45 PM By: Baltazar Najjar MD Signed: 12/05/2018 5:09:03 PM By: Elliot Gurney, BSN, RN, CWS, Kim RN, BSN Entered By: Elliot Gurney, BSN, RN, CWS, Kim on 12/05/2018 10:10:10 Ewart, Javier L. ([id number]) --------------------------------------------------------------------------------  Problem List Details Patient Name: KAYCE, CHISMAR Date of Service: 12/05/2018 9:30 AM Medical Record Number: [id number] Patient Account Number:  [id number] Date of Birth/Sex: Jun 26, 1961 (57 y.o. 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 11/14/2018 No Yes layer exposed E11.42 Type 2 diabetes mellitus with diabetic polyneuropathy 11/14/2018 No Yes E11.51 Type 2 diabetes mellitus with diabetic peripheral angiopathy 11/14/2018 No Yes without gangrene Inactive Problems Resolved Problems Electronic Signature(s) Signed: 12/05/2018 4:51:45 PM By: Baltazar Najjar MD Entered By: Baltazar Najjar on 12/05/2018 10:18:59 Radilla, [name] ([id number]) -------------------------------------------------------------------------------- Progress Note Details Patient Name: [name] Date of Service: 12/05/2018 9:30 AM Medical Record Number: [id number] Patient Account Number: [id number] Date of Birth/Sex: [birth year] (57 y.o. F) Treating RN: Huel Coventry Primary Care Provider: Joen [name] Other Clinician: Referring Provider: Joen [name] Treating Provider/Extender: Altamese [place] in Treatment: 3 Subjective History of Present Illness (HPI) 57 year old patient who was seen by visiting [name] wound care specialist for a wound on both her buttock and was found to have an unstageable wound on the right buttock for about 2 months. I understand that she had a fall and was laying on the floor for about 48 hours before she was found and taken to the ICU and had a long injury to her gluteal area from pressure and also had broken her right humerus. She has had a right proximal humerus fracture and has been followed up with orthopedics recently. The patient has a past medical history of type  2 diabetes mellitus, paraparesis, acute pyelonephritis, GERD, hypertension, glaucoma, chronic pain, anxiety neurosis, nicotine dependence, COPD. the patient had some debridement done and was to operative was recommended to use Silvadene dressing and offloading. She is a smoker and occasionally smokes a few cigarettes. the patient requested a second opinion for months and is here to discuss her care. 09/21/16; the patient re-presents from home today for review of 3 different wounds. I note that she was seen in the clinic here in July at which time she had bilateral buttock wounds. It was apparently suggested at that time that she use a wound VAC bridged to both wounds just near the initial tuberosity's bilaterally which she refused. The history was a bit difficult to put together. Apparently this patient became ill at the end of April of this year. She was found sitting on the floor she had apparently been for 2 days and subsequently admitted to hospital from 04/23/16 through 05/02/16 and at that point she was critically ill ultimately having sepsis secondary to UTI, nontraumatic rhabdomyolysis and diabetic ketoacidosis. She had acute renal failure and I think required ICU care including intubation. Patient states her wounds actually started at that point on the bilateral issue tuberosities however in reviewing the discharge summary from 5/8 I see no reference to wounds at that point. It did state that she had left lower extremity cellulitis however. Reviewing Epic I see no relevant x-rays. It would appear that her discharge creatinine was within the normal range and indeed on 9/15 her creatinine has remained normal. [facility] There the wounds on her bilateral Buttocks were dressed. Only just before her discharge from the nursing facility she  developed an "knot" which was interpreted as cellulitis on the posterior aspect of her left knee she  was given antibiotics. Apparently sometime late in July a this actually opened and became a wound at home health care was tending to however she is still having purulent drainage coming from this and by my understanding the wound depth is actually become unmeasurable. I am not really clear about what home health has been placing in any of these wound areas. The patient states that is something with silver and it. She is not been systemically unwell no fever or chills her appetite is good. She is a diabetic poorly controlled however she states that her recent blood sugars at home have been in the low to mid 100s. 09/28/16 On evaluation today patient appears to continue to exhibit the 3 areas of ulceration that were noted previous. She did have an x-ray of the right pelvis which showed evidence of potential soft tissue infection but no obvious osteomyelitis. There was a discussion last office visit concerning the possibility of a wound VAC. Witth that being said the x-ray report suggested that an MRI may be more appropriate to further evaluate the extent. Subsequently in regard to the wound over the popliteal portion of the left lower extremity with tunneling at 12:00 the CT scan that was ordered was denied by insurance as they state the patient has not had x-rays prior to advanced imaging. Patient states that she is frustrated with the situation overall. 10/05/16 in the interval since I last saw this patient last week she has had the x-ray of the knee performed. I did review that x-ray today and fortunately shows no evidence of osteomyelitis or other acute abnormality at this point in time. She continues to have the opening iin the posterior left popliteal space with tracking proximal up the posterior thigh. Nothing seems to have worsened but it also seems to have not improved. The same is true in regard to the right pressure ulcer over the gluteal region which extends toward the ischium. The left  gluteal pressure ulcer actually appears to be doing somewhat better my opinion there is some necrotic slough but overall this appears fairly well. She tells me thatt she has some discomfort especially when home health is helping her with dressing changes as they do not know her. At worse she rates her pain to be a 5 out of 10 right now it's more like a 1 out of 10. Vineyard, Zillah L. ([id number]) 10/07/16; still the patient has 3 different wound areas. She has a deep stage IV wound over her right ischial tuberosity. She is due to have an MRI next week. The wound over her left ischial tuberosity is more superficial and underwent debridement today. Finally she has a small open area in her left popliteal fossa the probes on measurably forward superiorly. Still a lot of drainage coming out of this. The CT scan that I ordered 3 weeks ago was questioned by her insurance company wanting a plain x-ray first. As I understand things result of this is nothing has been done in 3 weeks in terms of imaging the thigh and she has an MRI booked of this along with her pelvis for next week line 10/12/16; patient has a deep probing wound over the right ischiall tuberosity, stage III wound over the left visual tuberosity and a draining sinus in her left popliteal fossa. None of this much different from when I saw this 3 weeks ago. We have been using  silver rope to the right ischial wound and a draining area in the left popliteal fossa. Plain silver alginate to the area on the left ischial tuberosity 10/19/16; the patient's wounds are essentially unchanged although the area on the left lower gluteal is actually improved. Our intake nurse noted drainage from the right initial tuberosity probing wound as well as the draining area in the left popliteal fossa. Both of these were cultured. She had x-rays I think at the insistence of her insurance company on 09/23/16 x-ray of the pelvis was not particularly helpful she did have  soft tissue air over the right lower pelvis although with the depth of this wound this is not surprising. An x-ray of her left knee did not show any specific abnormalities. We are still using silver alginate to these wound areas. Her MRI is booked for 10/27 10/26/16; cultures of the purulent drainage in her right initial tuberosity wound grew moderate Proteus and few staph aureus. The same organisms were cultured out of the left knee sinus tract posteriorly. The staph aureus is MRSA. I had started her on Augmentin last week I added doxycycline. The MRI of the left lower extremity and pelvis was finally done. The MRI of the femur showed subcutaneous soft tissue swelling edema fluid and myositis in the vastus lateralis muscle but no soft tissue abscess septic arthritis or osteomyelitis. MRI of the pelvis showed the left wound to be more expensive extending down to the bone there was osteomyelitis. Left hamstring tendons were also involved. No septic arthritis involving the hip. The decubitus ulcer on the right side showed no definite osteomyelitis or abscess.. The right hip wound is actually the one the probes 6 cm downward. But the MRI showing infection including osteomyelitis on the left explains the draining sinus in the popliteal fossa on the left. She did have antibiotics in the hospitalization last time and this extended into her nursing home stay but I'm not exactly sure what antibiotics and for what duration. According the patient this did include vancomycin with considerable effort of our staff we are able to get the patient into see Dr. Sampson Goon today. There were transportation difficulties. Her mother had open heart surgery and is in the ICU in [place] therefore her brother was unable to transport. Dr. Jarrett Ables office graciously arranged time to see her today. From my point of view she is going to require IV vancomycin plus perhaps a third generation cephalosporin. I plan to keep her  on doxycycline and Augmentin until the IV antibiotics can be arranged. 11/02/2016 - Shell presents today for management of ulcers; She saw Dr. 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 She is to have the PICC line placed today and receive the initial dose for both antibiotics today. She has yet to receive the offloading chair cushion and/or mattress overlay from home health, apparently this has been a 3 week process. I encouraged her to speak to home health regarding this matter, along with offering home health to contact the wouns care center with any questions or concerns. The left ischial pressure ulcer continues to imporve, while to right ischial ulcer has increased in depth. The popliteal fossa sinus tract remains unmeasurable due to the limitation of depth measurement (tract extends beyond our measuring devices). 11-16-2016 Ms. Hands presents today for evaluation and management of bilateral ischial stage IV pressure ulcers and sinus tract to the left popliteal fossa. she is under the care  of Dr. Sampson Goon for IV antibiotic therapy; she states that the vancomycin was placed on hold and will be restarted at a lower dose based on her renal function. She continues taking Zosyn in addition to the vancomycin. She also states that she has yet to receive offloading cushions from home health, according to her she does not qualify for these offloading cushions because "the ulcers are unstageable ". We will contact the home health agency today to lend clarity regarding her pressure ulcers. The left popliteal fossa sinus tract continues to be a measurable as it extends beyond the length of our measuring devices.. 11/30/16; the patient is still on vancomycin and Zosyn. The depth of the draining sinus behind her left popliteal fossa is down to 4 cm although there is still serosanguineous drainage coming out of  this. She saw Dr. Sampson Goon of infectious disease yesterday the idea is to weeks more of IV antibiotics and then oral antibiotics although I have not read his note. The area on the left gluteal fold is just about healed. She has a 6 cm draining sinus over the right initial tuberosity although I cannot feel bone at the base of this. As far as the patient is aware she has not had a recheck of her inflammatory markers. 12/07/16; patient is on vancomycin and Zosyn appointment with Dr. Sampson Goon on the 19th at which point the patient expects to have a change in antibiotics. Remarkable improvement over the wound over the left ischial tuberosity which is just about closed. The draining sinus in her popliteal fossa has 0.4 cm in depth. The area on the right ischial tuberosity still probes down 7 cm. This is closed and overall wound dimensions but not depth. 12/14/16; patient is completing her vancomycin and Zosyn and per her she is going to transition to Bactrim and Augmentin for another 3 weeks. The area on her left gluteal fold is closed except for some skin tears. The area behind her left knee is no Oxley, Nikki L. ([id number]) longer has any depth. The only remaining area that is of clinical concern is on the right gluteal fold probing towards the right ischial tuberosity. Today this measures 6.9 cm in depth. Very gritty surface 12/21/16; patient is now on Bactrim and Augmentin as directed by infectious disease. This should be for another 2 weeks. The area in her left gluteal fold and left popliteal are closed over and fully healed. Measurements today at 7 cm in the right buttock wound is unchanged from last week. 12/27/16; patient is on Bactrim and Augmentin for another week as directed by infectious disease. She has completed her IV antibiotics. She is not been systemically unwell no fever no chills. The area on her right buttock measured over 6 cm in depth. There is no palpable bone. No evidence of  surrounding soft tissue infection. She is complaining of tongue irritation and has a history of thrush 01/04/17; patient is been back to see Dr. Sampson Goon, her Augmentin was stopped but he continued the Bactrim for another 3 weeks. Depth of the wound is 6.7 cm there is been no major change in either direction. She is not receive the wound VAC from home health I think because of confusion about who is supposed to provided will actually talk to the home health agency today [[facility] The net no major change. 01/18/17; patient obtained her wound VAC about 10 days ago however for some reason it was not actually put on the wound. She is therefore here for [place]  to apply this I guess. No other issues are noted. She is not complaining of pain fever drainage 02/01/17; patient is here now having the wound VAC or 3-4 weeks to a deep pressure area over the right initial tuberosity. This measures 6 cm in depth today which is about half a centimeter better than 2 weeks ago. There is no evidence she is systemically unwell no fever no chills no pain around the area. 02/15/17; I follow this patient every 2 weeks for a deep area over the right ischial tuberosity. This measures 5.5 cm today which is a continued improvement of 1.2 cm from 1/10 and down 0.5 cm from her visit 2 weeks ago 03/01/17; continued difficult area over the right initial tuberosity using the wound VAC. Depth today of 5.1 cm which is improved. Does not appear to be a lot of drainage in the canister. Antibiotics were finally stopped by Dr. Sampson Goon bactrim[]  and inflammatory markers have been repeated 03/15/17; fall this lady every 2 weeks for a difficult area over her lower right gluteal area/ischial tuberosity. Depth today at 4.6 cm. This is a slow but steady improvement in the depth of this wound. Although we have labeled this as a pressure ulcer there may have been an underlying infection here at one point before we saw her. She had osteomyelitis on  the left extensively which is since resolved 03/29/17- patient is here for follow-up evaluation of her right ischial pressure ulcer. She continues with the wound VAC and home health. According to the nurse home health has been using less foams and appropriate and we will instruct accordingly. The patient continues to smoke, approximately 10 cigarettes a day. She has been advised to decrease that in half by her next appointment, with a goal of complete cessation. She states her blood sugars have been consistently less than 150. She states that she spends most of her day position left lateral or prone. She does have an air mattress on her bed, she does not have an mattress for her chair, she states she cannot afford this. 04/12/17; patient is here for evaluation of her right ischial pressure ulcer. We continue to use a wound VAC with minimal improvement today the depth of this measuring 4.4 cm versus 4.6 cm 2 weeks ago. We are using a K[employer] wound VAC on this area. There is not excessive drainage no pain. The patient tells me she tries to keep off this in bed but is up in the wheelchair for 2 hours a day. She is limited in no her overall ambulation but is improving and apparently is getting bilateral lower extremity braces which she hopes will improve her ability to walk independently. 05/10/17; Depth at 3.3cm. Improved 05/24/17; depth at 3.5 cm. This is not improved since last time. Not clear if they are using collagen under the foam 06/07/17; depth that 2.9 which is a slight improvement. Still using the wound VAC. 06/21/17; depth is 2.9 cm which is exactly the same as last time. Also the appearance of this wound is completely the same. We have been using silver collagen under a wound VAC. 07/05/17 patient presents today for reevaluation concerning her right Ischial wound. She had switched insurance companies and so it does appear that the Wound VAC needs to be reauthorized which we are working on this  morning. Nonetheless her wound has been saying about the same we have continued to use the silver collagen underneath the wound VAC. She has no discomfort. 07/19/17; patient follows every 2  weeks for her right if she'll wound. Since I last saw this a month ago her dimensions of come down to 2.6 cm. This is slightly down from a month ago when it was 2.9 cm in 4 months ago it was 4.6 cm. I note that there were insurance company issues with regard to the wound VAC which she apparently is not having on for several weeks now. I think it would be reasonable to change therapies here. Will use silver alginate rope 08/02/17 on evaluation today patient's wound actually appears to be doing significantly better in regard to the depth as compared to her previous evaluation. She is having no discomfort. Unfortunately she never got the Wound VAC reapproved following the insurance issues from several weeks ago. With that being said it does not appear that she needs to requires the Wound VAC anymore and in fact I feel that she is doing better and making greater improvements at this point without it. Fortunately she has no nausea, vomiting, diarrhea, fevers, or chills. 08/16/17; patient's wound depth down to 2.3 cm. She is using silver alginate packing rope. Note that her wound VAC was discontinued due to insurance issues. This is not particularly surprising. She has not been systemically unwell and has no other new complaints Wich, Trishelle L. ([id number]) 08/30/17; no change in depth. Still at 2.3 cm. Still with the same gritty surface requiring debridement. I've been using silver alginate for quite a period of time although this came down nicely in the last month 10/04/17; the depth of this is 2 cm however with careful inspection under high-intensity light most of the walls of this small probing sinus seemed to be normally epithelialized. I cannot exactly see the base of this however there is been no drainage. We've been  using silver alginate there is no drainage on the dressing when it is removed. 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 There is no evidence of surrounding infection or pain. The patient had many review weakness in her bilateral legs. She is already been to see a neurologist who according the patient told her "you would never walk again" 11/01/17; this is a patient I last saw a month ago. At that point the linear tunnel in her right buttock was 2 cm. It was not possible to see the depth of this as skin had grown into the tunnel. In light of the fact that there was no drainage and no visible wound I recommended that we just allow her to go about her usual activity without dressing. She reports that almost immediately she noted drainage on her clothing although in spite of her instructions the contrary she did not come back to the clinic. She has not been specifically addressing this and is continued to no drainage without other symptoms either local or systemic 11/29/17; I follow this woman monthly. Last time the wound on her right buttock was 1.8 cm today measuring at 1.4 there has been gradual improvement in this. Concerning is the fact that the patient says that Dr. Sampson Goon of infectious disease changed her from doxycycline to Augmentin apparently because of the elevated inflammatory markers. She also said he did a culture of this area but she is not heard the results. I don't think I have his information available on care everywhere however I will check this. She is using Hydrofera Blue rope. The patient and her intake nurse report that she is still having identifiable drainage which certainly makes it clear that this  is not closed. 01/02/17 on evaluation today patient appears to be doing fairly well in regard to her left gluteal wound. She has been continuing with the [facility] Dressing's here in our office. We actually see her once a month and  then subsequently are performing weekly dressing changes with Hydrofera Blue Dressing rope during nurse visits one time a week. Today there really was not much drainage at this point. However this has happened previously where she had no drainage and was essentially thought to be close internally although the external had not completely pulled together. However then she reopened and began draining again. He has been mentioned the possibility of her seeing plastic surgery to try to get this finally and completely healed. 01/18/18 she is here in follow-up evaluation for right ischial pressure ulcer. She has an appointment with [facility] plastic surgery on 2/8 for evaluation. She continues to have minimal drainage at the tip of the dressing product. She continues to smoke, admits to 8-10 cigarettes per day. No change in treatment plan 02/06/18 on evaluation today patient appears to be doing about the same in regard to her right Ischial wound. She has been tolerating the dressing changes without complication. She did have her appointment at [facility] plastic surgery she was not really impressed. They stated that due to her weight they were unable to perform a flap and subsequently recommended the only thing they could attempt would be to exercise around the ulcer and then attempt to suture it together. The question is whether this would be effective or not. Patient really is not wanting to proceed down that road. Nonetheless she really have not changed much in regard to her wound measurements. 03/06/18 on evaluation today patient states that last week when I perform the silver nitrate that she actually had some burning pain for about two days following. Since that time the area has been very dry and she states that it is also been very dark and scabbed around the area. With that being said other than this she feels that the hope was the fact that it was burning was a good sign and that they would be improvement but  the really does not appear to be significant improvement at this point. We have tried a lot of different dressings for her most recently Hydrofera Blue Dressing, silver alginate rope, and collagen. We have never attempted Endoform which I think could be a possibility for her. 03/22/18-She is here for a follow-up evaluation of her right ischial pressure ulcer. There is no drainage on her dressings and what appears to be epidermal debris in the ulcer; she has no pain. The ulcer was cleanse with betadine, I believe it is healed, despite remaining a  1" depth. We will place a dry dressing over and evaluate next week for drainage, if no drainage she will be discharged at that time. 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 She underwent a prolonged course of IV antibiotics as directed by Dr. Sampson Goon. The tunneling right ischial pressure ulcer as eventually closed over. We've been following this over the last month or so with no identifiable area that was open. We've been using Endoform and hydrogel. She rising clinic today, our intake nurse reported some dampness. READMISSION 11/14/18 This patient is a 57 year old woman  who we've previously had in clinic for a very long period of time finally culminating in April [place], [place] L. ([id number]) 2019. At that point she had bilateral pressure ulcers over her ischial tuberosities complicated by underlying osteomyelitis and a large tracking area that gave her wound in the left popliteal fossa. She required a prolonged course of IV antibiotics and wound care in the last wound on her right ischial tuberosity was closed out in April/19. She claims that this is still closed. We did not review this. The patient is a type II diabetic on insulin.  She states that 3 months ago she noted loose skin on her heel which she pulled off. She then had a deeper area that she filed down with an instrument. She's been left with a wound on the left heel which she says is actually better and less deep. More recently she's been using peroxide and leaving it open to air. 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 We were not able to obtain ABIs in this clinic 12/05/18; it's been 3 weeks since I saw this patient. Since then she went for her noninvasive arterial studies. On the left the patient had an SFA that was occluded at its origin with long segment chronic occlusion into the thigh. The distal segment of the SFA is open monophasic waveforms. There is some flow in the profunda femoral artery. The popliteal artery is monophasic anterior tibial and posterior tibials demonstrate low velocities in monophasic waveforms. The patient is a known diabetic. She is going for angiogram on 12/30. The wound is really deteriorated somewhat. There is black eschar. The wound is deeper. She has some erythema around the wound spreading above the lateral heel. There is also a small cyst that I don't remember seeing. This was not obviously infectious and I didn't open this today. She is going to need antibiotics she tells me she has doxycycline at home which is reasonable. Objective Constitutional Sitting or standing Blood Pressure is within target range for patient.. Pulse regular and within target range for patient.Marland Kitchen Respirations regular, non-labored and within target range.. Temperature is normal and within the target range for the patient.Marland Kitchen appears in no distress.She does not appear to be systemically unwell. Vitals Time Taken: 9:42 AM, Height: 63 in, Source: Stated, Weight: 246 lbs, Source: Stated, BMI: 43.6,  Temperature: 98.4 F, Pulse: 103 bpm, Respiratory Rate: 18 breaths/min, Blood Pressure: 122/65 mmHg, Capillary Blood Glucose: 143 mg/dl. Eyes Conjunctivae clear. No discharge. Respiratory Respiratory effort is easy and symmetric bilaterally. Rate is normal at rest and on room air.. Cardiovascular Popliteal pulses absent on the left but palpable at the left inguinal. Pedal pulses Absent on the left. Lymphatic None palpable in the left popliteal or inguinal area. General Notes: When exam; plantar left heel towards the lateral aspect. This does not look healthy. Dry pale-looking tissue probably dry gangrene. Also concerning is some erythema is spreading above this into the lateral heel. She is insensate Bucio, Lunah L. ([id number]) therefore does not feel pain. This could be ongoing ischemia as well but I felt antibiotics were necessary. There is also a cyst or soft raised area above this. This is not obviously a pustule and I was not prepared to debride this. Integumentary (Hair, Skin) Concerning erythema to appear really above the wound. I have marked this area. Wound #6 status is Open. Original cause of wound was Trauma. The wound is  located on the Left,Lateral Foot. The wound measures 2.7cm length x 2.5cm width x 0.4cm depth; 5.301cm^2 area and 2.121cm^3 volume. There is Fat Layer (Subcutaneous Tissue) Exposed exposed. There is undermining starting at 12:00 and ending at 12:00 with a maximum distance of 1.5cm. There is a none present amount of drainage noted. Foul odor after cleansing was noted. The wound margin is well defined and not attached to the wound base. There is no granulation within the wound bed. There is a large (67-100%) amount of necrotic tissue within the wound bed including Eschar. The periwound skin appearance exhibited: Callus, Dry/Scaly, Erythema. The periwound skin appearance did not exhibit: Crepitus, Excoriation, Induration, Rash, Scarring, Maceration, Atrophie Blanche,  Cyanosis, Ecchymosis, Hemosiderin Staining, Mottled, Pallor, Rubor. The surrounding wound skin color is noted with erythema. Erythema is marked. Periwound temperature was noted as No Abnormality. The periwound has tenderness on palpation. Assessment Active Problems ICD-10 Type 2 diabetes mellitus with foot ulcer Non-pressure chronic ulcer of left heel and midfoot with fat layer exposed Type 2 diabetes mellitus with diabetic polyneuropathy Type 2 diabetes mellitus with diabetic peripheral angiopathy without gangrene Procedures Wound #6 Pre-procedure diagnosis of Wound #6 is a To be determined located on the Left,Lateral Foot . There was a Chemical/Enzymatic/Mechanical debridement performed by Maxwell Caul, MD. With the following instrument(s): tongue blade after achieving pain control using Lidocaine. Agent used was [employer]. A time out was conducted at 10:10, prior to the start of the procedure. There was no bleeding. The procedure was tolerated well. Post Debridement Measurements: 2.7cm length x 2.5cm width x 0.4cm depth; 2.121cm^3 volume. Character of Wound/Ulcer Post Debridement is stable. Post procedure Diagnosis Wound #6: Same as Pre-Procedure Plan Wound Cleansing: Wound #6 Left,Lateral Foot: Clean wound with Normal Saline. Anesthetic (add to Medication List): Wound #6 Left,Lateral Foot: Detweiler, Monick L. ([id number]) Topical Lidocaine 4% cream applied to wound bed prior to debridement (In Clinic Only). Primary Wound Dressing: Wound #6 Left,Lateral Foot: Santyl Ointment Secondary Dressing: Wound #6 Left,Lateral Foot: Conform/Kerlix - Heel cup Dressing Change Frequency: Wound #6 Left,Lateral Foot: Change Dressing Monday, Wednesday, Friday - Homehealth Follow-up Appointments: Wound #6 Left,Lateral Foot: Return Appointment in 1 week. Edema Control: Wound #6 Left,Lateral Foot: Elevate legs to the level of the heart and pump ankles as often as possible Off-Loading: Wound #6  Left,Lateral Foot: Other: - Keep pressure off of left heel Additional Orders / Instructions: Wound #6 Left,Lateral Foot: Stop Smoking Home Health: Wound #6 Left,Lateral Foot: Continue Home Health Visits - [facility] Nurse may visit PRN to address patient s wound care needs. FACE TO FACE ENCOUNTER: MEDICARE and MEDICAID PATIENTS: I certify that this patient is under my care and that I had a face-to-face encounter that meets the physician face-to-face encounter requirements with this patient on this date. The encounter with the patient was in whole or in part for the following MEDICAL CONDITION: (primary reason for Home Healthcare) MEDICAL NECESSITY: I certify, that based on my findings, NURSING services are a medically necessary home health service. HOME BOUND STATUS: I certify that my clinical findings support that this patient is homebound (i.e., Due to illness or injury, pt requires aid of supportive devices such as crutches, cane, wheelchairs, walkers, the use of special transportation or the assistance of another person to leave their place of residence. There is a normal inability to leave the home and doing so requires considerable and taxing effort. Other absences are for medical reasons / religious services and are infrequent or of short  duration when for other reasons). If current dressing causes regression in wound condition, may D/C ordered dressing product/s and apply Normal Saline Moist Dressing daily until next Wound Healing Center / Other MD appointment. Notify Wound Healing Center of regression in wound condition at [phone]. Please direct any NON-WOUND related issues/requests for orders to patient's Primary Care Physician Medications-please add to medication list.: Wound #6 Left,Lateral Foot: P.O. Antibiotics - Doxycycline 100mg  BID for 10 days Services and Therapies ordered were: Angiogram, peripheral - December 24, 2018 #1 I change the primary dressing to  [facility]. I be okay if they change this every 3 times a week #2 the patient tells me she has Santyl at home #3 in. Doxycycline which the patient claims to already have at home. I've asked her to call when she gets back home to let us know if that's not the case. #4 this is beginning to look like an increasingly ischemic situation. She is due for an angiogram on 12/30. We'll need to see if this can stay out that long. #5 I have not x-rayed the heel but I'll keep this in mind week to week KENSLI, BOWLEY ([id number]) Electronic Signature(s) Signed: 12/05/2018 4:51:45 PM By: Baltazar Najjar MD Entered By: Baltazar Najjar on 12/05/2018 10:27:11 Sipp, [name] ([id number]) -------------------------------------------------------------------------------- SuperBill Details Patient Name: [name] Date of Service: 12/05/2018 Medical Record Number: [id number] Patient Account Number: [id number] Date of Birth/Sex: 01/10/1961 (57 y.o. F) Treating RN: Huel Coventry Primary Care Provider: Joen [name] Other Clinician: Referring Provider: Joen [name] Treating Provider/Extender: Altamese [place] in Treatment: 3 Diagnosis Coding ICD-10 Codes Code Description E11.621 Type 2 diabetes mellitus with foot ulcer L97.422 Non-pressure chronic ulcer of left heel and midfoot with fat layer exposed E11.42 Type 2 diabetes mellitus with diabetic polyneuropathy E11.51 Type 2 diabetes mellitus with diabetic peripheral angiopathy without gangrene Facility Procedures CPT4 Code: 40102725 Description: 36644 - DEBRIDE W/O ANES NON SELECT Modifier: Quantity: 1 Physician Procedures CPT4 Code: [id number] Description: 99213 - WC PHYS LEVEL 3 - EST PT ICD-10 Diagnosis Description L97.422 Non-pressure chronic ulcer of left heel and midfoot with fat E11.621 Type 2 diabetes mellitus with foot ulcer Modifier: layer exposed Quantity: 1 Electronic Signature(s) Signed: 12/05/2018 4:51:45 PM By: Baltazar Najjar MD Entered  By: Baltazar Najjar on 12/05/2018 10:27:44

## 2018-12-11 ENCOUNTER — Ambulatory Visit: Payer: Self-pay

## 2018-12-12 ENCOUNTER — Encounter: Payer: Medicare HMO | Admitting: Internal Medicine

## 2018-12-12 ENCOUNTER — Other Ambulatory Visit
Admission: RE | Admit: 2018-12-12 | Discharge: 2018-12-12 | Disposition: A | Discharge status: Home / Self Care | Payer: Medicare HMO | Source: Ambulatory Visit | Attending: Internal Medicine | Admitting: Internal Medicine

## 2018-12-12 ENCOUNTER — Telehealth (INDEPENDENT_AMBULATORY_CARE_PROVIDER_SITE_OTHER): Payer: Self-pay

## 2018-12-12 ENCOUNTER — Other Ambulatory Visit (INDEPENDENT_AMBULATORY_CARE_PROVIDER_SITE_OTHER): Payer: Self-pay | Admitting: Vascular Surgery

## 2018-12-12 DIAGNOSIS — E11621 Type 2 diabetes mellitus with foot ulcer: Secondary | ICD-10-CM | POA: Diagnosis not present

## 2018-12-12 DIAGNOSIS — L02612 Cutaneous abscess of left foot: Secondary | ICD-10-CM | POA: Diagnosis present

## 2018-12-12 NOTE — Telephone Encounter (Signed)
The wound center called wanting to move the patient's angio up to a sooner date because the wound has gotten worse. Patient was moved from 12/24/18 to 12/17/18 with Dr. Wyn Quaker. The information was given to the patient through the wound center. I called the patient and made sure she got the arrival time which was changed from 9:45 to 10:45 am.

## 2018-12-13 NOTE — Progress Notes (Signed)
ADIEL, MCNAMARA ([id number]) Visit Report for 12/12/2018 Debridement Details Patient Name: EULLA, KOCHANOWSKI Date of Service: 12/12/2018 9:30 AM Medical Record Number: [id number] Patient Account Number: [id number] Date of Birth/Sex: 02-21-1961 (57 y.o. F) Treating RN: Huel Coventry Primary Care Provider: Joen [name] Other Clinician: Referring Provider: Joen [name] Treating Provider/Extender: Altamese Mounds in Treatment: 4 Debridement Performed for Wound #6 Left,Lateral Foot Assessment: Performed By: Physician Maxwell Caul, MD Debridement Type: Debridement Level of Consciousness (Pre- Awake and Alert procedure): Pre-procedure Verification/Time Yes - 09:35 Out Taken: Start Time: 09:35 Pain Control: Lidocaine Total Area Debrided (L x W): 5.5 (cm) x 3 (cm) = 16.5 (cm) Tissue and other material Viable, Non-Viable, Skin: Dermis , Skin: Epidermis debrided: Level: Skin/Epidermis Debridement Description: Selective/Open Wound Instrument: Blade, Forceps Bleeding: None Hemostasis Achieved: Pressure Response to Treatment: Procedure was tolerated well Level of Consciousness Awake and Alert (Post-procedure): Post Debridement Measurements of Total Wound Length: (cm) 5.5 Stage: Category/Stage II Width: (cm) 3 Depth: (cm) 0 Volume: (cm) 0 Character of Wound/Ulcer Post Requires Further Debridement Debridement: Post Procedure Diagnosis Same as Pre-procedure Electronic Signature(s) Signed: 12/12/2018 5:32:05 PM By: Elliot Gurney, BSN, RN, CWS, Kim RN, BSN Signed: 12/12/2018 5:55:34 PM By: Baltazar Najjar MD Entered By: Baltazar Najjar on 12/12/2018 09:49:10 Yin, [name] ([id number]) -------------------------------------------------------------------------------- HPI Details Patient Name: [name] Date of Service: 12/12/2018 9:30 AM Medical Record Number: [id number] Patient Account Number: [id number] Date of Birth/Sex: Jan 20, 1961 (57 y.o. F) Treating RN: Huel Coventry Primary Care Provider: Joen [name] Other Clinician: Referring Provider: Joen [name] Treating Provider/Extender: Altamese Fenton in Treatment: 4 History of Present Illness HPI Description: 57 year old patient who was seen by visiting [name] wound care specialist for a wound on both her buttock and was found to have an unstageable wound on the right buttock for about 2 months. I understand that she had a fall and was laying on the floor for about 48 hours before she was found and taken to the ICU and had a long injury to her gluteal area from pressure and also had broken her right humerus. She has had a right proximal humerus fracture and has been followed up with orthopedics recently. The patient has a past medical history of type 2 diabetes mellitus, paraparesis, acute pyelonephritis, GERD, hypertension, glaucoma, chronic pain, anxiety neurosis, nicotine dependence, COPD. the patient had some debridement done and was to operative was recommended to use Silvadene dressing and offloading. She is a smoker and occasionally smokes a few cigarettes. the patient requested a second opinion for months and is here to discuss her care. 09/21/16; the patient re-presents from home today for review of 3 different wounds. I note that she was seen in the clinic here in July at which time she had bilateral buttock wounds. It was apparently suggested at that time that she use a wound VAC bridged to both wounds just near the initial tuberosity's bilaterally which she refused. The history was a bit difficult to put together. Apparently this patient became ill at the end of April of this year. She was found sitting on the floor she had apparently been for 2 days and subsequently admitted to hospital from 04/23/16 through 05/02/16 and at that point she was critically ill ultimately having sepsis secondary to UTI, nontraumatic rhabdomyolysis and diabetic ketoacidosis. She had acute renal failure and I think  required ICU care including intubation. Patient states her wounds actually started at that point on the bilateral issue tuberosities however in reviewing the discharge summary  from 5/8 I see no reference to wounds at that point. It did state that she had left lower extremity cellulitis however. Reviewing Epic I see no relevant x-rays. It would appear that her discharge creatinine was within the normal range and indeed on 9/15 her creatinine has remained normal. [facility] There the wounds on her bilateral Buttocks were dressed. Only just before her discharge from the nursing facility she developed an "knot" which was interpreted as cellulitis on the posterior aspect of her left knee she was given antibiotics. Apparently sometime late in July a this actually opened and became a wound at home health care was tending to however she is still having purulent drainage coming from this and by my understanding the wound depth is actually become unmeasurable. I am not really clear about what home health has been placing in any of these wound areas. The patient states that is something with silver and it. She is not been systemically unwell no fever or chills her appetite is good. She is a diabetic poorly controlled however she states that her recent blood sugars at home have been in the low to mid 100s. 09/28/16 On evaluation today patient appears to continue to exhibit the 3 areas of ulceration that were noted previous. She did have an x-ray of the right pelvis which showed evidence of potential soft tissue infection but no obvious osteomyelitis. There was a discussion last office visit concerning the possibility of a wound VAC. Witth that being said the x-ray report suggested that an MRI may be more appropriate to further evaluate the extent. Subsequently in regard to the wound over the popliteal portion of the left lower extremity with tunneling  at 12:00 the CT scan that was ordered was denied by insurance as they state the patient has not had x-rays prior to advanced imaging. Patient states that she is frustrated with the situation overall. 10/05/16 in the interval since I last saw this patient last week she has had the x-ray of the knee performed. I did review that x-ray today and fortunately shows no evidence of osteomyelitis or other acute abnormality at this point in time. She continues to have the opening iin the posterior left popliteal space with tracking proximal up the posterior thigh. Nothing seems to have worsened but it also seems to have not improved. The same is true in regard to the right pressure ulcer over the gluteal region which extends toward the ischium. The left gluteal pressure ulcer actually appears to be doing somewhat better my opinion there is some necrotic slough but overall this appears fairly well. She tells me thatt she has some discomfort especially when home health is helping her with dressing changes as they do not know her. At worse she rates her pain to be a 5 out of 10 right now it's more like a 1 out of 10. Gilbertson, Jeriyah L. ([id number]) 10/07/16; still the patient has 3 different wound areas. She has a deep stage IV wound over her right ischial tuberosity. She is due to have an MRI next week. The wound over her left ischial tuberosity is more superficial and underwent debridement today. Finally she has a small open area in her left popliteal fossa the probes on measurably forward superiorly. Still a lot of drainage coming out of this. The CT scan that I ordered 3 weeks ago was questioned by her insurance company wanting a plain x-ray first. As I understand things result  of this is nothing has been done in 3 weeks in terms of imaging the thigh and she has an MRI booked of this along with her pelvis for next week line 10/12/16; patient has a deep probing wound over the right ischiall tuberosity, stage III  wound over the left visual tuberosity and a draining sinus in her left popliteal fossa. None of this much different from when I saw this 3 weeks ago. We have been using silver rope to the right ischial wound and a draining area in the left popliteal fossa. Plain silver alginate to the area on the left ischial tuberosity 10/19/16; the patient's wounds are essentially unchanged although the area on the left lower gluteal is actually improved. Our intake nurse noted drainage from the right initial tuberosity probing wound as well as the draining area in the left popliteal fossa. Both of these were cultured. She had x-rays I think at the insistence of her insurance company on 09/23/16 x-ray of the pelvis was not particularly helpful she did have soft tissue air over the right lower pelvis although with the depth of this wound this is not surprising. An x-ray of her left knee did not show any specific abnormalities. We are still using silver alginate to these wound areas. Her MRI is booked for 10/27 10/26/16; cultures of the purulent drainage in her right initial tuberosity wound grew moderate Proteus and few staph aureus. The same organisms were cultured out of the left knee sinus tract posteriorly. The staph aureus is MRSA. I had started her on Augmentin last week I added doxycycline. The MRI of the left lower extremity and pelvis was finally done. The MRI of the femur showed subcutaneous soft tissue swelling edema fluid and myositis in the vastus lateralis muscle but no soft tissue abscess septic arthritis or osteomyelitis. MRI of the pelvis showed the left wound to be more expensive extending down to the bone there was osteomyelitis. Left hamstring tendons were also involved. No septic arthritis involving the hip. The decubitus ulcer on the right side showed no definite osteomyelitis or abscess.. The right hip wound is actually the one the probes 6 cm downward. But the MRI showing infection including  osteomyelitis on the left explains the draining sinus in the popliteal fossa on the left. She did have antibiotics in the hospitalization last time and this extended into her nursing home stay but I'm not exactly sure what antibiotics and for what duration. According the patient this did include vancomycin with considerable effort of our staff we are able to get the patient into see Dr. Sampson Goon today. There were transportation difficulties. Her mother had open heart surgery and is in the ICU in [place] therefore her brother was unable to transport. Dr. Jarrett Ables office graciously arranged time to see her today. From my point of view she is going to require IV vancomycin plus perhaps a third generation cephalosporin. I plan to keep her on doxycycline and Augmentin until the IV antibiotics can be arranged. 11/02/2016 - Devyn presents today for management of ulcers; She saw Dr. 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 She is to have the PICC line placed today and receive the initial dose for both antibiotics today. She has yet to receive the offloading chair cushion and/or mattress overlay from home health, apparently this has been a 3 week process. I encouraged her to speak to home health regarding this matter, along with offering home  health to contact the wouns care center with any questions or concerns. The left ischial pressure ulcer continues to imporve, while to right ischial ulcer has increased in depth. The popliteal fossa sinus tract remains unmeasurable due to the limitation of depth measurement (tract extends beyond our measuring devices). 11-16-2016 Ms. Costales presents today for evaluation and management of bilateral ischial stage IV pressure ulcers and sinus tract to the left popliteal fossa. she is under the care of Dr. Sampson Goon for IV antibiotic therapy; she states that the  vancomycin was placed on hold and will be restarted at a lower dose based on her renal function. She continues taking Zosyn in addition to the vancomycin. She also states that she has yet to receive offloading cushions from home health, according to her she does not qualify for these offloading cushions because "the ulcers are unstageable ". We will contact the home health agency today to lend clarity regarding her pressure ulcers. The left popliteal fossa sinus tract continues to be a measurable as it extends beyond the length of our measuring devices.. 11/30/16; the patient is still on vancomycin and Zosyn. The depth of the draining sinus behind her left popliteal fossa is down to 4 cm although there is still serosanguineous drainage coming out of this. She saw Dr. Sampson Goon of infectious disease yesterday the idea is to weeks more of IV antibiotics and then oral antibiotics although I have not read his note. The area on the left gluteal fold is just about healed. She has a 6 cm draining sinus over the right initial tuberosity although I cannot feel bone at the base of this. As far as the patient is aware she has not had a recheck of her inflammatory markers. 12/07/16; patient is on vancomycin and Zosyn appointment with Dr. Sampson Goon on the 19th at which point the patient expects to have a change in antibiotics. Remarkable improvement over the wound over the left ischial tuberosity which is just about closed. The draining sinus in her popliteal fossa has 0.4 cm in depth. The area on the right ischial tuberosity still probes down 7 cm. This is closed and overall wound dimensions but not depth. 12/14/16; patient is completing her vancomycin and Zosyn and per her she is going to transition to Bactrim and Augmentin for another 3 weeks. The area on her left gluteal fold is closed except for some skin tears. The area behind her left knee is no longer has any depth. The only remaining area that is of  clinical concern is on the right gluteal fold probing towards the right Waldo, Eliora L. ([id number]) ischial tuberosity. Today this measures 6.9 cm in depth. Very gritty surface 12/21/16; patient is now on Bactrim and Augmentin as directed by infectious disease. This should be for another 2 weeks. The area in her left gluteal fold and left popliteal are closed over and fully healed. Measurements today at 7 cm in the right buttock wound is unchanged from last week. 12/27/16; patient is on Bactrim and Augmentin for another week as directed by infectious disease. She has completed her IV antibiotics. She is not been systemically unwell no fever no chills. The area on her right buttock measured over 6 cm in depth. There is no palpable bone. No evidence of surrounding soft tissue infection. She is complaining of tongue irritation and has a history of thrush 01/04/17; patient is been back to see Dr. Sampson Goon, her Augmentin was stopped but he continued the Bactrim for another 3 weeks. Depth of  the wound is 6.7 cm there is been no major change in either direction. She is not receive the wound VAC from home health I think because of confusion about who is supposed to provided will actually talk to the home health agency today [[facility] The net no major change. 01/18/17; patient obtained her wound VAC about 10 days ago however for some reason it was not actually put on the wound. She is therefore here for [place] to apply this I guess. No other issues are noted. She is not complaining of pain fever drainage 02/01/17; patient is here now having the wound VAC or 3-4 weeks to a deep pressure area over the right initial tuberosity. This measures 6 cm in depth today which is about half a centimeter better than 2 weeks ago. There is no evidence she is systemically unwell no fever no chills no pain around the area. 02/15/17; I follow this patient every 2 weeks for a deep area over the right ischial tuberosity. This measures  5.5 cm today which is a continued improvement of 1.2 cm from 1/10 and down 0.5 cm from her visit 2 weeks ago 03/01/17; continued difficult area over the right initial tuberosity using the wound VAC. Depth today of 5.1 cm which is improved. Does not appear to be a lot of drainage in the canister. Antibiotics were finally stopped by Dr. Sampson Goon bactrim[]  and inflammatory markers have been repeated 03/15/17; fall this lady every 2 weeks for a difficult area over her lower right gluteal area/ischial tuberosity. Depth today at 4.6 cm. This is a slow but steady improvement in the depth of this wound. Although we have labeled this as a pressure ulcer there may have been an underlying infection here at one point before we saw her. She had osteomyelitis on the left extensively which is since resolved 03/29/17- patient is here for follow-up evaluation of her right ischial pressure ulcer. She continues with the wound VAC and home health. According to the nurse home health has been using less foams and appropriate and we will instruct accordingly. The patient continues to smoke, approximately 10 cigarettes a day. She has been advised to decrease that in half by her next appointment, with a goal of complete cessation. She states her blood sugars have been consistently less than 150. She states that she spends most of her day position left lateral or prone. She does have an air mattress on her bed, she does not have an mattress for her chair, she states she cannot afford this. 04/12/17; patient is here for evaluation of her right ischial pressure ulcer. We continue to use a wound VAC with minimal improvement today the depth of this measuring 4.4 cm versus 4.6 cm 2 weeks ago. We are using a [employer] wound VAC on this area. There is not excessive drainage no pain. The patient tells me she tries to keep off this in bed but is up in the wheelchair for 2 hours a day. She is limited in no her overall ambulation but is  improving and apparently is getting bilateral lower extremity braces which she hopes will improve her ability to walk independently. 05/10/17; Depth at 3.3cm. Improved 05/24/17; depth at 3.5 cm. This is not improved since last time. Not clear if they are using collagen under the foam 06/07/17; depth that 2.9 which is a slight improvement. Still using the wound VAC. 06/21/17; depth is 2.9 cm which is exactly the same as last time. Also the appearance of this wound is completely  the same. We have been using silver collagen under a wound VAC. 07/05/17 patient presents today for reevaluation concerning her right Ischial wound. She had switched insurance companies and so it does appear that the Wound VAC needs to be reauthorized which we are working on this morning. Nonetheless her wound has been saying about the same we have continued to use the silver collagen underneath the wound VAC. She has no discomfort. 07/19/17; patient follows every 2 weeks for her right if she'll wound. Since I last saw this a month ago her dimensions of come down to 2.6 cm. This is slightly down from a month ago when it was 2.9 cm in 4 months ago it was 4.6 cm. I note that there were insurance company issues with regard to the wound VAC which she apparently is not having on for several weeks now. I think it would be reasonable to change therapies here. Will use silver alginate rope 08/02/17 on evaluation today patient's wound actually appears to be doing significantly better in regard to the depth as compared to her previous evaluation. She is having no discomfort. Unfortunately she never got the Wound VAC reapproved following the insurance issues from several weeks ago. With that being said it does not appear that she needs to requires the Wound VAC anymore and in fact I feel that she is doing better and making greater improvements at this point without it. Fortunately she has no nausea, vomiting, diarrhea, fevers, or  chills. 08/16/17; patient's wound depth down to 2.3 cm. She is using silver alginate packing rope. Note that her wound VAC was discontinued due to insurance issues. This is not particularly surprising. She has not been systemically unwell and has no other new complaints 08/30/17; no change in depth. Still at 2.3 cm. Still with the same gritty surface requiring debridement. I've been using silver Grabill, Milica L. ([id number]) alginate for quite a period of time although this came down nicely in the last month 10/04/17; the depth of this is 2 cm however with careful inspection under high-intensity light most of the walls of this small probing sinus seemed to be normally epithelialized. I cannot exactly see the base of this however there is been no drainage. We've been using silver alginate there is no drainage on the dressing when it is removed. 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 There is no evidence of surrounding infection or pain. The patient had many review weakness in her bilateral legs. She is already been to see a neurologist who according the patient told her "you would never walk again" 11/01/17; this is a patient I last saw a month ago. At that point the linear tunnel in her right buttock was 2 cm. It was not possible to see the depth of this as skin had grown into the tunnel. In light of the fact that there was no drainage and no visible wound I recommended that we just allow her to go about her usual activity without dressing. She reports that almost immediately she noted drainage on her clothing although in spite of her instructions the contrary she did not come back to the clinic. She has not been specifically addressing this and is continued to no drainage without other symptoms either local or systemic 11/29/17; I follow this woman monthly. Last time the wound on her right buttock was 1.8 cm today measuring at 1.4 there has been gradual  improvement in this. Concerning is the fact that the patient says  that Dr. Sampson Goon of infectious disease changed her from doxycycline to Augmentin apparently because of the elevated inflammatory markers. She also said he did a culture of this area but she is not heard the results. I don't think I have his information available on care everywhere however I will check this. She is using Hydrofera Blue rope. The patient and her intake nurse report that she is still having identifiable drainage which certainly makes it clear that this is not closed. 01/02/17 on evaluation today patient appears to be doing fairly well in regard to her left gluteal wound. She has been continuing with the [facility] Dressing's here in our office. We actually see her once a month and then subsequently are performing weekly dressing changes with Hydrofera Blue Dressing rope during nurse visits one time a week. Today there really was not much drainage at this point. However this has happened previously where she had no drainage and was essentially thought to be close internally although the external had not completely pulled together. However then she reopened and began draining again. He has been mentioned the possibility of her seeing plastic surgery to try to get this finally and completely healed. 01/18/18 she is here in follow-up evaluation for right ischial pressure ulcer. She has an appointment with [facility] plastic surgery on 2/8 for evaluation. She continues to have minimal drainage at the tip of the dressing product. She continues to smoke, admits to 8-10 cigarettes per day. No change in treatment plan 02/06/18 on evaluation today patient appears to be doing about the same in regard to her right Ischial wound. She has been tolerating the dressing changes without complication. She did have her appointment at [facility] plastic surgery she was not really impressed. They stated that due to her weight they were unable to  perform a flap and subsequently recommended the only thing they could attempt would be to exercise around the ulcer and then attempt to suture it together. The question is whether this would be effective or not. Patient really is not wanting to proceed down that road. Nonetheless she really have not changed much in regard to her wound measurements. 03/06/18 on evaluation today patient states that last week when I perform the silver nitrate that she actually had some burning pain for about two days following. Since that time the area has been very dry and she states that it is also been very dark and scabbed around the area. With that being said other than this she feels that the hope was the fact that it was burning was a good sign and that they would be improvement but the really does not appear to be significant improvement at this point. We have tried a lot of different dressings for her most recently Hydrofera Blue Dressing, silver alginate rope, and collagen. We have never attempted Endoform which I think could be a possibility for her. 03/22/18-She is here for a follow-up evaluation of her right ischial pressure ulcer. There is no drainage on her dressings and what appears to be epidermal debris in the ulcer; she has no pain. The ulcer was cleanse with betadine, I believe it is healed, despite remaining a  1" depth. We will place a dry dressing over and evaluate next week for drainage, if no drainage she will be discharged at that time. 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 She underwent a prolonged course of IV antibiotics as directed by Dr. Sampson Goon. The tunneling right ischial pressure ulcer as eventually closed over. We've been following this over the last month or so  with no identifiable area that was open. We've been using Endoform and hydrogel. She rising clinic today, our intake nurse reported some dampness. READMISSION 11/14/18 This patient is a 57 year old woman who we've previously had in clinic for a very long period of time finally culminating in April 2019. At that point she had bilateral pressure ulcers over her ischial tuberosities complicated by underlying osteomyelitis and Verville, Carlin L. ([id number]) a large tracking area that gave her wound in the left popliteal fossa. She required a prolonged course of IV antibiotics and wound care in the last wound on her right ischial tuberosity was closed out in April/19. She claims that this is still closed. We did not review this. The patient is a type II diabetic on insulin. She states that 3 months ago she noted loose skin on her heel which she pulled off. She then had a deeper area that she filed down with an instrument. She's been left with a wound on the left heel which she says is actually better and less deep. More recently she's been using peroxide and leaving it open to air. 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 We were not able to obtain ABIs in this clinic 12/05/18; it's been 3 weeks since I saw this patient. Since then she went for her noninvasive arterial studies. On the left the patient had an SFA that was occluded at its origin with long segment chronic occlusion into the thigh. The distal segment of the SFA is open monophasic waveforms. There is some flow in the profunda femoral artery. The popliteal artery is monophasic anterior tibial and posterior tibials demonstrate low velocities in monophasic waveforms. The patient is a known diabetic. She is going for angiogram on 12/30. The wound is really deteriorated somewhat. There is  black eschar. The wound is deeper. She has some erythema around the wound spreading above the lateral heel. There is also a small cyst that I don't remember seeing. This was not obviously infectious and I didn't open this today. She is going to need antibiotics she tells me she has doxycycline at home which is reasonable. 12/12/2018; she arrives in clinic this week with a report from her home health nurse that things were not looking very good. Indeed there is nonviable tissue. The eschar is not quite as adherent but there is extension of the wound superiorly some surrounding erythema as well. She has been on doxycycline for a week may be somewhat longer. She has been using Santyl She is not systemically unwell in terms of fever although she tells me that since Thanksgiving she has been having postprandial nausea and vomiting and is not eating very well. There is no hematemesis and no clear complaints of abdominal pain. She states that she vomits roughly 2 hours after particles of undigested food. She also states she has lost 30 pounds over the course of this year Electronic Signature(s) Signed: 12/12/2018 5:55:34 PM By: Baltazar Najjar MD Entered By: Baltazar Najjar on 12/12/2018 09:53:17 Dizon, [name] ([id number]) -------------------------------------------------------------------------------- Physical Exam Details Patient Name: [name] Date of Service: 12/12/2018 9:30 AM Medical Record Number: [id number] Patient Account  Number: [id number] Date of Birth/Sex: Jul 23, 1961 (57 y.o. F) Treating RN: Huel Coventry Primary Care Provider: Joen [name] Other Clinician: Referring Provider: Joen [name] Treating Provider/Extender: Altamese [place] in Treatment: 4 Constitutional Sitting or standing Blood Pressure is within target range for patient.. Pulse regular and within target range for patient.Marland Kitchen Respirations regular, non-labored and within target range.. Temperature is normal and  within the target range for the patient.Marland Kitchen appears in no distress. Respiratory Respiratory effort is easy and symmetric bilaterally. Rate is normal at rest and on room air.. Bilateral breath sounds are clear and equal in all lobes with no wheezes, rales or rhonchi.. Cardiovascular Heart rhythm and rate regular, without murmur or gallop.. Gastrointestinal (GI) Abdomen is soft and non-distended without masses or tenderness. Bowel sounds active in all quadrants.. No liver or spleen enlargement or tenderness.Marland Kitchen Psychiatric No evidence of depression, anxiety, or agitation. Calm, cooperative, and communicative. Appropriate interactions and affect.. Notes Wound exam; lateral aspect of the left heel. There is less thick adherent eschar but the tissue still does not look healthy. In view of pending arterial interventions no debridement at this point. A lot of the surrounding skin is macerated and nonviable I removed a lot of this with pickups and scalpel. I opened the small area superiorly that was raised last week. There was no purulent drainage but I did obtain a specimen for culture. Electronic Signature(s) Signed: 12/12/2018 5:55:34 PM By: Baltazar Najjar MD Entered By: Baltazar Najjar on 12/12/2018 09:55:17 Lewellen, [name] ([id number]) -------------------------------------------------------------------------------- Physician Orders Details Patient Name: [name] Date of Service: 12/12/2018 9:30 AM Medical Record Number: [id number] Patient Account Number: [id number] Date of Birth/Sex: 1961/09/21 (57 y.o. F) Treating RN: Huel Coventry Primary Care Provider: Joen [name] Other Clinician: Referring Provider: Joen [name] Treating Provider/Extender: Altamese [place] in Treatment: 4 Verbal / Phone Orders: No Diagnosis Coding Wound Cleansing Wound #6 Left,Lateral Foot o Clean wound with Normal Saline. Anesthetic (add to Medication List) Wound #6 Left,Lateral Foot o Topical  Lidocaine 4% cream applied to wound bed prior to debridement (In Clinic Only). Primary Wound Dressing Wound #6 Left,Lateral Foot o Silver Alginate Secondary Dressing Wound #6 Left,Lateral Foot o Conform/Kerlix - Heel cup Dressing Change Frequency Wound #6 Left,Lateral Foot o Change Dressing Monday, Wednesday, Friday [facility] Follow-up Appointments Wound #6 Left,Lateral Foot o Return Appointment in 1 week. Edema Control Wound #6 Left,Lateral Foot o Elevate legs to the level of the heart and pump ankles as often as possible Off-Loading Wound #6 Left,Lateral Foot o Other: - Keep pressure off of left heel Additional Orders / Instructions Wound #6 Left,Lateral Foot o Stop Smoking Home Health Wound #6 Left,Lateral Foot o Continue [facility] Health Visits - [facility] o Home Health Nurse may visit PRN to address patientos wound care needs. JANAUTICA, NETZLEY ([id number]) o FACE TO FACE ENCOUNTER: MEDICARE and MEDICAID PATIENTS: I certify that this patient is under my care and that I had a face-to-face encounter that meets the physician face-to-face encounter requirements with this patient on this date. The encounter with the patient was in whole or in part for the following MEDICAL CONDITION: (primary reason for Home Healthcare) MEDICAL NECESSITY: I certify, that based on my findings, NURSING services are a medically necessary home health service. HOME BOUND STATUS: I certify that my clinical findings support that this patient is homebound (i.e., Due to illness or injury, pt requires aid of supportive devices such as crutches, cane, wheelchairs, walkers, the use of special transportation or the assistance of  another person to leave their place of residence. There is a normal inability to leave the home and doing so requires considerable and taxing effort. Other absences are for medical reasons / religious services and are infrequent or of short duration when for other  reasons). o If current dressing causes regression in wound condition, may D/C ordered dressing product/s and apply Normal Saline Moist Dressing daily until next Wound Healing Center / Other MD appointment. Notify Wound Healing Center of regression in wound condition at [phone]. o Please direct any NON-WOUND related issues/requests for orders to patient's Primary Care Physician Medications-please add to medication list. Wound #6 Left,Lateral Foot o P.O. Antibiotics - Continue Doxycycline 100mg  BID for 10 days Laboratory o Bacteria identified in Wound by Culture (MICRO) - Left heel oooo LOINC Code: 6462-6 oooo Convenience Name: Wound culture routine Radiology o X-ray, foot - Left heel Electronic Signature(s) Signed: 12/12/2018 5:32:05 PM By: Elliot Gurney, BSN, RN, CWS, Kim RN, BSN Signed: 12/12/2018 5:55:34 PM By: Baltazar Najjar MD Entered By: Elliot Gurney, BSN, RN, CWS, Kim on 12/12/2018 09:44:02 [place], [name] ([id number]) -------------------------------------------------------------------------------- Problem List Details Patient Name: [name] Date of Service: 12/12/2018 9:30 AM Medical Record Number: [id number] Patient Account Number: [id number] Date of Birth/Sex: 03-11-1961 (57 y.o. 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 11/14/2018 No Yes layer exposed E11.42 Type 2 diabetes mellitus with diabetic polyneuropathy 11/14/2018 No Yes E11.51 Type 2 diabetes mellitus with diabetic peripheral angiopathy 11/14/2018 No Yes without gangrene Inactive Problems Resolved Problems Electronic Signature(s) Signed:  12/12/2018 5:55:34 PM By: Baltazar Najjar MD Entered By: Baltazar Najjar on 12/12/2018 10:23:14 Lenn, [name] ([id number]) -------------------------------------------------------------------------------- Progress Note Details Patient Name: [name] Date of Service: 12/12/2018 9:30 AM Medical Record Number: [id number] Patient Account Number: [id number] Date of Birth/Sex: Nov 27, 1961 (57 y.o. F) Treating RN: Huel Coventry Primary Care Provider: Joen [name] Other Clinician: Referring Provider: Joen [name] Treating Provider/Extender: Altamese [place] in Treatment: 4 Subjective History of Present Illness (HPI) 56 year old patient who was seen by visiting [name] wound care specialist for a wound on both her buttock and was found to have an unstageable wound on the right buttock for about 2 months. I understand that she had a fall and was laying on the floor for about 48 hours before she was found and taken to the ICU and had a long injury to her gluteal area from pressure and also had broken her right humerus. She has had a right proximal humerus fracture and has been followed up with orthopedics recently. The patient has a past medical history of type 2 diabetes mellitus, paraparesis, acute pyelonephritis, GERD, hypertension, glaucoma, chronic pain, anxiety neurosis, nicotine dependence, COPD. the patient had some debridement done and was to operative was recommended to use Silvadene dressing and offloading. She is a smoker and occasionally smokes a few cigarettes. the patient requested a second opinion for months and is here to discuss her care. 09/21/16; the patient re-presents from home today for review of 3 different wounds. I note that she was seen in the clinic here in July at which time she had bilateral buttock wounds. It was apparently suggested at that time that she use a wound VAC bridged to both  wounds just near the initial tuberosity's bilaterally which she refused. The  history was a bit difficult to put together. Apparently this patient became ill at the end of April of this year. She was found sitting on the floor she had apparently been for 2 days and subsequently admitted to hospital from 04/23/16 through 05/02/16 and at that point she was critically ill ultimately having sepsis secondary to UTI, nontraumatic rhabdomyolysis and diabetic ketoacidosis. She had acute renal failure and I think required ICU care including intubation. Patient states her wounds actually started at that point on the bilateral issue tuberosities however in reviewing the discharge summary from 5/8 I see no reference to wounds at that point. It did state that she had left lower extremity cellulitis however. Reviewing Epic I see no relevant x-rays. It would appear that her discharge creatinine was within the normal range and indeed on 9/15 her creatinine has remained normal. [facility] There the wounds on her bilateral Buttocks were dressed. Only just before her discharge from the nursing facility she developed an "knot" which was interpreted as cellulitis on the posterior aspect of her left knee she was given antibiotics. Apparently sometime late in July a this actually opened and became a wound at home health care was tending to however she is still having purulent drainage coming from this and by my understanding the wound depth is actually become unmeasurable. I am not really clear about what home health has been placing in any of these wound areas. The patient states that is something with silver and it. She is not been systemically unwell no fever or chills her appetite is good. She is a diabetic poorly controlled however she states that her recent blood sugars at home have been in the low to mid 100s. 09/28/16 On evaluation today patient appears to continue to exhibit the 3 areas of ulceration that were noted previous. She  did have an x-ray of the right pelvis which showed evidence of potential soft tissue infection but no obvious osteomyelitis. There was a discussion last office visit concerning the possibility of a wound VAC. Witth that being said the x-ray report suggested that an MRI may be more appropriate to further evaluate the extent. Subsequently in regard to the wound over the popliteal portion of the left lower extremity with tunneling at 12:00 the CT scan that was ordered was denied by insurance as they state the patient has not had x-rays prior to advanced imaging. Patient states that she is frustrated with the situation overall. 10/05/16 in the interval since I last saw this patient last week she has had the x-ray of the knee performed. I did review that x-ray today and fortunately shows no evidence of osteomyelitis or other acute abnormality at this point in time. She continues to have the opening iin the posterior left popliteal space with tracking proximal up the posterior thigh. Nothing seems to have worsened but it also seems to have not improved. The same is true in regard to the right pressure ulcer over the gluteal region which extends toward the ischium. The left gluteal pressure ulcer actually appears to be doing somewhat better my opinion there is some necrotic slough but overall this appears fairly well. She tells me thatt she has some discomfort especially when home health is helping her with dressing changes as they do not know her. At worse she rates her pain to be a 5 out of 10 right now  it's more like a 1 out of 10. Sicard, Macaila L. ([id number]) 10/07/16; still the patient has 3 different wound areas. She has a deep stage IV wound over her right ischial tuberosity. She is due to have an MRI next week. The wound over her left ischial tuberosity is more superficial and underwent debridement today. Finally she has a small open area in her left popliteal fossa the probes on measurably forward  superiorly. Still a lot of drainage coming out of this. The CT scan that I ordered 3 weeks ago was questioned by her insurance company wanting a plain x-ray first. As I understand things result of this is nothing has been done in 3 weeks in terms of imaging the thigh and she has an MRI booked of this along with her pelvis for next week line 10/12/16; patient has a deep probing wound over the right ischiall tuberosity, stage III wound over the left visual tuberosity and a draining sinus in her left popliteal fossa. None of this much different from when I saw this 3 weeks ago. We have been using silver rope to the right ischial wound and a draining area in the left popliteal fossa. Plain silver alginate to the area on the left ischial tuberosity 10/19/16; the patient's wounds are essentially unchanged although the area on the left lower gluteal is actually improved. Our intake nurse noted drainage from the right initial tuberosity probing wound as well as the draining area in the left popliteal fossa. Both of these were cultured. She had x-rays I think at the insistence of her insurance company on 09/23/16 x-ray of the pelvis was not particularly helpful she did have soft tissue air over the right lower pelvis although with the depth of this wound this is not surprising. An x-ray of her left knee did not show any specific abnormalities. We are still using silver alginate to these wound areas. Her MRI is booked for 10/27 10/26/16; cultures of the purulent drainage in her right initial tuberosity wound grew moderate Proteus and few staph aureus. The same organisms were cultured out of the left knee sinus tract posteriorly. The staph aureus is MRSA. I had started her on Augmentin last week I added doxycycline. The MRI of the left lower extremity and pelvis was finally done. The MRI of the femur showed subcutaneous soft tissue swelling edema fluid and myositis in the vastus lateralis muscle but no soft  tissue abscess septic arthritis or osteomyelitis. MRI of the pelvis showed the left wound to be more expensive extending down to the bone there was osteomyelitis. Left hamstring tendons were also involved. No septic arthritis involving the hip. The decubitus ulcer on the right side showed no definite osteomyelitis or abscess.. The right hip wound is actually the one the probes 6 cm downward. But the MRI showing infection including osteomyelitis on the left explains the draining sinus in the popliteal fossa on the left. She did have antibiotics in the hospitalization last time and this extended into her nursing home stay but I'm not exactly sure what antibiotics and for what duration. According the patient this did include vancomycin with considerable effort of our staff we are able to get the patient into see Dr. Sampson Goon today. There were transportation difficulties. Her mother had open heart surgery and is in the ICU in [place] therefore her brother was unable to transport. Dr. Jarrett Ables office graciously arranged time to see her today. From my point of view she is going to require IV vancomycin plus  perhaps a third generation cephalosporin. I plan to keep her on doxycycline and Augmentin until the IV antibiotics can be arranged. 11/02/2016 - Kandyce presents today for management of ulcers; She saw Dr. 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 She is to have the PICC line placed today and receive the initial dose for both antibiotics today. She has yet to receive the offloading chair cushion and/or mattress overlay from home health, apparently this has been a 3 week process. I encouraged her to speak to home health regarding this matter, along with offering home health to contact the wouns care center with any questions or concerns. The left ischial pressure ulcer continues to imporve, while to  right ischial ulcer has increased in depth. The popliteal fossa sinus tract remains unmeasurable due to the limitation of depth measurement (tract extends beyond our measuring devices). 11-16-2016 Ms. Vonseggern presents today for evaluation and management of bilateral ischial stage IV pressure ulcers and sinus tract to the left popliteal fossa. she is under the care of Dr. Sampson Goon for IV antibiotic therapy; she states that the vancomycin was placed on hold and will be restarted at a lower dose based on her renal function. She continues taking Zosyn in addition to the vancomycin. She also states that she has yet to receive offloading cushions from home health, according to her she does not qualify for these offloading cushions because "the ulcers are unstageable ". We will contact the home health agency today to lend clarity regarding her pressure ulcers. The left popliteal fossa sinus tract continues to be a measurable as it extends beyond the length of our measuring devices.. 11/30/16; the patient is still on vancomycin and Zosyn. The depth of the draining sinus behind her left popliteal fossa is down to 4 cm although there is still serosanguineous drainage coming out of this. She saw Dr. Sampson Goon of infectious disease yesterday the idea is to weeks more of IV antibiotics and then oral antibiotics although I have not read his note. The area on the left gluteal fold is just about healed. She has a 6 cm draining sinus over the right initial tuberosity although I cannot feel bone at the base of this. As far as the patient is aware she has not had a recheck of her inflammatory markers. 12/07/16; patient is on vancomycin and Zosyn appointment with Dr. Sampson Goon on the 19th at which point the patient expects to have a change in antibiotics. Remarkable improvement over the wound over the left ischial tuberosity which is just about closed. The draining sinus in her popliteal fossa has 0.4 cm in depth. The  area on the right ischial tuberosity still probes down 7 cm. This is closed and overall wound dimensions but not depth. 12/14/16; patient is completing her vancomycin and Zosyn and per her she is going to transition to Bactrim and Augmentin for another 3 weeks. The area on her left gluteal fold is closed except for some skin tears. The area behind her left knee is no Wile, Nonna L. ([id number]) longer has any depth. The only remaining area that is of clinical concern is on the right gluteal fold probing towards the right ischial tuberosity. Today this measures 6.9 cm in depth. Very gritty surface 12/21/16; patient is now on Bactrim and Augmentin as directed by infectious disease. This should be for another 2 weeks. The area in her left gluteal fold and left popliteal are closed over and  fully healed. Measurements today at 7 cm in the right buttock wound is unchanged from last week. 12/27/16; patient is on Bactrim and Augmentin for another week as directed by infectious disease. She has completed her IV antibiotics. She is not been systemically unwell no fever no chills. The area on her right buttock measured over 6 cm in depth. There is no palpable bone. No evidence of surrounding soft tissue infection. She is complaining of tongue irritation and has a history of thrush 01/04/17; patient is been back to see Dr. Sampson Goon, her Augmentin was stopped but he continued the Bactrim for another 3 weeks. Depth of the wound is 6.7 cm there is been no major change in either direction. She is not receive the wound VAC from home health I think because of confusion about who is supposed to provided will actually talk to the home health agency today [facility] The net no major change. 01/18/17; patient obtained her wound VAC about 10 days ago however for some reason it was not actually put on the wound. She is therefore here for [place] to apply this I guess. No other issues are noted. She is not complaining of pain  fever drainage 02/01/17; patient is here now having the wound VAC or 3-4 weeks to a deep pressure area over the right initial tuberosity. This measures 6 cm in depth today which is about half a centimeter better than 2 weeks ago. There is no evidence she is systemically unwell no fever no chills no pain around the area. 02/15/17; I follow this patient every 2 weeks for a deep area over the right ischial tuberosity. This measures 5.5 cm today which is a continued improvement of 1.2 cm from 1/10 and down 0.5 cm from her visit 2 weeks ago 03/01/17; continued difficult area over the right initial tuberosity using the wound VAC. Depth today of 5.1 cm which is improved. Does not appear to be a lot of drainage in the canister. Antibiotics were finally stopped by Dr. Sampson Goon bactrim[]  and inflammatory markers have been repeated 03/15/17; fall this lady every 2 weeks for a difficult area over her lower right gluteal area/ischial tuberosity. Depth today at 4.6 cm. This is a slow but steady improvement in the depth of this wound. Although we have labeled this as a pressure ulcer there may have been an underlying infection here at one point before we saw her. She had osteomyelitis on the left extensively which is since resolved 03/29/17- patient is here for follow-up evaluation of her right ischial pressure ulcer. She continues with the wound VAC and home health. According to the nurse home health has been using less foams and appropriate and we will instruct accordingly. The patient continues to smoke, approximately 10 cigarettes a day. She has been advised to decrease that in half by her next appointment, with a goal of complete cessation. She states her blood sugars have been consistently less than 150. She states that she spends most of her day position left lateral or prone. She does have an air mattress on her bed, she does not have an mattress for her chair, she states she cannot afford this. 04/12/17;  patient is here for evaluation of her right ischial pressure ulcer. We continue to use a wound VAC with minimal improvement today the depth of this measuring 4.4 cm versus 4.6 cm 2 weeks ago. We are using a [employer] wound VAC on this area. There is not excessive drainage no pain. The patient tells me she tries  to keep off this in bed but is up in the wheelchair for 2 hours a day. She is limited in no her overall ambulation but is improving and apparently is getting bilateral lower extremity braces which she hopes will improve her ability to walk independently. 05/10/17; Depth at 3.3cm. Improved 05/24/17; depth at 3.5 cm. This is not improved since last time. Not clear if they are using collagen under the foam 06/07/17; depth that 2.9 which is a slight improvement. Still using the wound VAC. 06/21/17; depth is 2.9 cm which is exactly the same as last time. Also the appearance of this wound is completely the same. We have been using silver collagen under a wound VAC. 07/05/17 patient presents today for reevaluation concerning her right Ischial wound. She had switched insurance companies and so it does appear that the Wound VAC needs to be reauthorized which we are working on this morning. Nonetheless her wound has been saying about the same we have continued to use the silver collagen underneath the wound VAC. She has no discomfort. 07/19/17; patient follows every 2 weeks for her right if she'll wound. Since I last saw this a month ago her dimensions of come down to 2.6 cm. This is slightly down from a month ago when it was 2.9 cm in 4 months ago it was 4.6 cm. I note that there were insurance company issues with regard to the wound VAC which she apparently is not having on for several weeks now. I think it would be reasonable to change therapies here. Will use silver alginate rope 08/02/17 on evaluation today patient's wound actually appears to be doing significantly better in regard to the depth as compared to  her previous evaluation. She is having no discomfort. Unfortunately she never got the Wound VAC reapproved following the insurance issues from several weeks ago. With that being said it does not appear that she needs to requires the Wound VAC anymore and in fact I feel that she is doing better and making greater improvements at this point without it. Fortunately she has no nausea, vomiting, diarrhea, fevers, or chills. 08/16/17; patient's wound depth down to 2.3 cm. She is using silver alginate packing rope. Note that her wound VAC was discontinued due to insurance issues. This is not particularly surprising. She has not been systemically unwell and has no other new complaints Calabrese, Mykeisha L. ([id number]) 08/30/17; no change in depth. Still at 2.3 cm. Still with the same gritty surface requiring debridement. I've been using silver alginate for quite a period of time although this came down nicely in the last month 10/04/17; the depth of this is 2 cm however with careful inspection under high-intensity light most of the walls of this small probing sinus seemed to be normally epithelialized. I cannot exactly see the base of this however there is been no drainage. We've been using silver alginate there is no drainage on the dressing when it is removed. 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 There is no evidence of surrounding infection or pain. The patient had many review weakness in her bilateral legs. She is already been to see a neurologist who according the patient told her "you would never walk again" 11/01/17; this is a patient I last saw a month ago. At that point the linear tunnel in her right buttock was 2 cm. It was not possible to see the depth of this as skin had grown into the tunnel. In light of the fact  that there was no drainage and no visible wound I recommended that we just allow her to go about her usual activity without dressing. She reports  that almost immediately she noted drainage on her clothing although in spite of her instructions the contrary she did not come back to the clinic. She has not been specifically addressing this and is continued to no drainage without other symptoms either local or systemic 11/29/17; I follow this woman monthly. Last time the wound on her right buttock was 1.8 cm today measuring at 1.4 there has been gradual improvement in this. Concerning is the fact that the patient says that Dr. Sampson Goon of infectious disease changed her from doxycycline to Augmentin apparently because of the elevated inflammatory markers. She also said he did a culture of this area but she is not heard the results. I don't think I have his information available on care everywhere however I will check this. She is using Hydrofera Blue rope. The patient and her intake nurse report that she is still having identifiable drainage which certainly makes it clear that this is not closed. 01/02/17 on evaluation today patient appears to be doing fairly well in regard to her left gluteal wound. She has been continuing with the [facility] Dressing's here in our office. We actually see her once a month and then subsequently are performing weekly dressing changes with Hydrofera Blue Dressing rope during nurse visits one time a week. Today there really was not much drainage at this point. However this has happened previously where she had no drainage and was essentially thought to be close internally although the external had not completely pulled together. However then she reopened and began draining again. He has been mentioned the possibility of her seeing plastic surgery to try to get this finally and completely healed. 01/18/18 she is here in follow-up evaluation for right ischial pressure ulcer. She has an appointment with [facility] plastic surgery on 2/8 for evaluation. She continues to have minimal drainage at the tip of the dressing  product. She continues to smoke, admits to 8-10 cigarettes per day. No change in treatment plan 02/06/18 on evaluation today patient appears to be doing about the same in regard to her right Ischial wound. She has been tolerating the dressing changes without complication. She did have her appointment at [facility] plastic surgery she was not really impressed. They stated that due to her weight they were unable to perform a flap and subsequently recommended the only thing they could attempt would be to exercise around the ulcer and then attempt to suture it together. The question is whether this would be effective or not. Patient really is not wanting to proceed down that road. Nonetheless she really have not changed much in regard to her wound measurements. 03/06/18 on evaluation today patient states that last week when I perform the silver nitrate that she actually had some burning pain for about two days following. Since that time the area has been very dry and she states that it is also been very dark and scabbed around the area. With that being said other than this she feels that the hope was the fact that it was burning was a good sign and that they would be improvement but the really does not appear to be significant improvement at this point. We have tried a lot of different dressings for her most recently Hydrofera Blue Dressing, silver alginate rope, and collagen. We have never attempted Endoform which I think could be  a possibility for her. 03/22/18-She is here for a follow-up evaluation of her right ischial pressure ulcer. There is no drainage on her dressings and what appears to be epidermal debris in the ulcer; she has no pain. The ulcer was cleanse with betadine, I believe it is healed, despite remaining a  1" depth. We will place a dry dressing over and evaluate next week for drainage, if no drainage she will be discharged at that time. 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 She underwent a prolonged course of IV antibiotics as directed by Dr. Sampson Goon. The tunneling right ischial pressure ulcer as eventually closed over. We've been following this over the last month or so with no identifiable area that was open. We've been using Endoform and hydrogel. She rising clinic today, our intake nurse reported some dampness. READMISSION 11/14/18 This patient is a 57 year old woman who we've previously had in clinic for a very long period of time finally culminating in April Steinhart, [place] L. ([id number]) 2019. At that point she had bilateral pressure ulcers over her ischial tuberosities complicated by underlying osteomyelitis and a large tracking area that gave her wound in the left popliteal fossa. She required a prolonged course of IV antibiotics and wound care in the last wound on her right ischial tuberosity was closed out in April/19. She claims that this is still closed. We did not review this. The patient is a type II diabetic on insulin. She states that 3 months ago she noted loose skin on her heel which she pulled off. She then had a deeper area that she filed down with an instrument. She's been left with a wound on the left heel which she says is actually better and less deep. More recently she's been using peroxide and leaving it open to air. 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 We were not able to obtain ABIs in this clinic 12/05/18; it's been 3 weeks since I saw this patient. Since then she went for her noninvasive arterial studies. On the left the patient had an SFA that was occluded at its origin  with long segment chronic occlusion into the thigh. The distal segment of the SFA is open monophasic waveforms. There is some flow in the profunda femoral artery. The popliteal artery is monophasic anterior tibial and posterior tibials demonstrate low velocities in monophasic waveforms. The patient is a known diabetic. She is going for angiogram on 12/30. The wound is really deteriorated somewhat. There is black eschar. The wound is deeper. She has some erythema around the wound spreading above the lateral heel. There is also a small cyst that I don't remember seeing. This was not obviously infectious and I didn't open this today. She is going to need antibiotics she tells me she has doxycycline at home which is reasonable. 12/12/2018; she arrives in clinic this week with a report from her home health nurse that things were not looking very good. Indeed there is nonviable tissue. The eschar is not quite as adherent but there is extension of the wound superiorly some surrounding erythema as well. She has been on doxycycline for a week may be somewhat longer. She has been using Santyl She is  not systemically unwell in terms of fever although she tells me that since Thanksgiving she has been having postprandial nausea and vomiting and is not eating very well. There is no hematemesis and no clear complaints of abdominal pain. She states that she vomits roughly 2 hours after particles of undigested food. She also states she has lost 30 pounds over the course of this year Objective Constitutional Sitting or standing Blood Pressure is within target range for patient.. Pulse regular and within target range for patient.Marland Kitchen Respirations regular, non-labored and within target range.. Temperature is normal and within the target range for the patient.Marland Kitchen appears in no distress. Vitals Time Taken: 9:20 AM, Height: 63 in, Weight: 246 lbs, BMI: 43.6, Temperature: 98.4 F, Pulse: 104 bpm, Respiratory Rate: 18  breaths/min, Blood Pressure: 135/73 mmHg. Respiratory Respiratory effort is easy and symmetric bilaterally. Rate is normal at rest and on room air.. Bilateral breath sounds are clear and equal in all lobes with no wheezes, rales or rhonchi.. Cardiovascular Heart rhythm and rate regular, without murmur or gallop.Elesa Massed, Climmie LMarland Kitchen ([id number]) Gastrointestinal (GI) Abdomen is soft and non-distended without masses or tenderness. Bowel sounds active in all quadrants.. No liver or spleen enlargement or tenderness.Marland Kitchen Psychiatric No evidence of depression, anxiety, or agitation. Calm, cooperative, and communicative. Appropriate interactions and affect.. General Notes: Wound exam; lateral aspect of the left heel. There is less thick adherent eschar but the tissue still does not look healthy. In view of pending arterial interventions no debridement at this point. A lot of the surrounding skin is macerated and nonviable I removed a lot of this with pickups and scalpel. I opened the small area superiorly that was raised last week. There was no purulent drainage but I did obtain a specimen for culture. Integumentary (Hair, Skin) Wound #6 status is Open. Original cause of wound was Trauma. The wound is located on the Left,Lateral Foot. The wound measures 5.5cm length x 3cm width x 0.2cm depth; 12.959cm^2 area and 2.592cm^3 volume. There is Fat Layer (Subcutaneous Tissue) Exposed exposed. There is no tunneling or undermining noted. There is a large amount of serosanguineous drainage noted. Foul odor after cleansing was noted. The wound margin is well defined and not attached to the wound base. There is no granulation within the wound bed. There is a large (67-100%) amount of necrotic tissue within the wound bed including Eschar and Adherent Slough. The periwound skin appearance exhibited: Callus, Dry/Scaly, Erythema. The periwound skin appearance did not exhibit: Crepitus, Excoriation, Induration, Rash,  Scarring, Maceration, Atrophie Blanche, Cyanosis, Ecchymosis, Hemosiderin Staining, Mottled, Pallor, Rubor. The surrounding wound skin color is noted with erythema. Erythema is marked. Periwound temperature was noted as No Abnormality. The periwound has tenderness on palpation. 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 There was a Selective/Open Wound Skin/Epidermis Debridement with a total area of 16.5 sq cm performed by Maxwell Caul, MD. With the following instrument(s): Blade, and Forceps to remove Viable and Non-Viable tissue/material. Material removed includes Skin: Dermis and Skin: Epidermis and after achieving pain control using Lidocaine. No specimens were taken. A time out was conducted at 09:35, prior to the start of the procedure. There was no bleeding. The procedure was tolerated well.  Post Debridement Measurements: 5.5cm length x 3cm width x 0cm depth; 0cm^3 volume. Post debridement Stage noted as Category/Stage II. Character of Wound/Ulcer Post Debridement requires further debridement. Post procedure Diagnosis Wound #6: Same as Pre-Procedure Homer, Joniah L. ([id number]) Plan Wound Cleansing: Wound #6 Left,Lateral Foot: Clean wound with Normal Saline. Anesthetic (add to Medication List): Wound #6 Left,Lateral Foot: Topical Lidocaine 4% cream applied to wound bed prior to debridement (In Clinic Only). Primary Wound Dressing: Wound #6 Left,Lateral Foot: Silver Alginate Secondary Dressing: Wound #6 Left,Lateral Foot: Conform/Kerlix - Heel cup Dressing Change Frequency: Wound #6 Left,Lateral Foot: Change Dressing Monday,  Wednesday, Friday [facility] Follow-up Appointments: Wound #6 Left,Lateral Foot: Return Appointment in 1 week. Edema Control: Wound #6 Left,Lateral Foot: Elevate legs to the level of the heart and pump ankles as often as possible Off-Loading: Wound #6 Left,Lateral Foot: Other: - Keep pressure off of left heel Additional Orders / Instructions: Wound #6 Left,Lateral Foot: Stop Smoking Home Health: Wound #6 Left,Lateral Foot: Continue Home Health Visits - [facility] Nurse may visit PRN to address patient s wound care needs. FACE TO FACE ENCOUNTER: MEDICARE and MEDICAID PATIENTS: I certify that this patient is under my care and that I had a face-to-face encounter that meets the physician face-to-face encounter requirements with this patient on this date. The encounter with the patient was in whole or in part for the following MEDICAL CONDITION: (primary reason for Home Healthcare) MEDICAL NECESSITY: I certify, that based on my findings, NURSING services are a medically necessary home health service. HOME BOUND STATUS: I certify that my clinical findings support that this patient is homebound (i.e., Due to illness or injury, pt requires aid of supportive devices such as crutches, cane, wheelchairs, walkers, the use of special transportation or the assistance of another person to leave their place of residence. There is a normal inability to leave the home and doing so requires considerable and taxing effort. Other absences are for medical reasons / religious services and are infrequent or of short duration when for other reasons). If current dressing causes regression in wound condition, may D/C ordered dressing product/s and apply Normal Saline Moist Dressing daily until next Wound Healing Center / Other MD appointment. Notify Wound Healing Center of regression in wound condition at [phone]. Please direct any NON-WOUND related issues/requests for orders to patient's Primary  Care Physician Medications-please add to medication list.: Wound #6 Left,Lateral Foot: P.O. Antibiotics - Continue Doxycycline 100mg  BID for 10 days Laboratory ordered were: Wound culture routine - Left heel Radiology ordered were: X-ray, foot - Left heel Havens, Taylin L. ([id number]) 1. We were not happy with the condition of this wound 2. We managed to get her angiogram moved up to December 23 from December 30. 3. I have cultured the wound but have not adjusted the doxycycline she is taken. The culture spot was from the raised area that I thought might of been a small abscess although there was no drainage. 4. I have not changed the antibiotics 5. Change the primary dressing to silver alginate 6. She will need an x-ray of the heel and we have arranged this 7. I have asked her to make an appointment with her primary doctor in the new year with regards to the postprandial vomiting. Her exam is normal but she certainly needs lab work and perhaps a referral to a Dietitian) Signed: 12/12/2018 5:55:34 PM By: Baltazar Najjar MD Entered By: Baltazar Najjar on 12/12/2018 09:56:50 Wieseler, Malina L. ([id number]) --------------------------------------------------------------------------------  SuperBill Details Patient Name: NAKOTA, ACKERT Date of Service: 12/12/2018 Medical Record Number: [id number] Patient Account Number: [id number] Date of Birth/Sex: 1961/12/14 (57 y.o. F) Treating RN: Huel Coventry Primary Care Provider: Joen [name] Other Clinician: Referring Provider: Joen [name] Treating Provider/Extender: Altamese Kite in Treatment: 4 Diagnosis Coding ICD-10 Codes Code Description E11.621 Type 2 diabetes mellitus with foot ulcer L97.422 Non-pressure chronic ulcer of left heel and midfoot with fat layer exposed E11.42 Type 2 diabetes mellitus with diabetic polyneuropathy E11.51 Type 2 diabetes mellitus with diabetic peripheral angiopathy without  gangrene Facility Procedures CPT4 Code: [id number] Description: [phone] - DEBRIDE WOUND 1ST 20 SQ CM OR < ICD-10 Diagnosis Description L97.422 Non-pressure chronic ulcer of left heel and midfoot with fat la Modifier: yer exposed Quantity: 1 Physician Procedures CPT4 Code: [id number] Description: 97597 - WC PHYS DEBR WO ANESTH 20 SQ CM ICD-10 Diagnosis Description L97.422 Non-pressure chronic ulcer of left heel and midfoot with fat la Modifier: yer exposed Quantity: 1 Electronic Signature(s) Signed: 12/12/2018 5:55:34 PM By: Baltazar Najjar MD Entered By: Baltazar Najjar on 12/12/2018 09:57:09

## 2018-12-14 ENCOUNTER — Other Ambulatory Visit: Payer: Medicare HMO

## 2018-12-15 LAB — AEROBIC CULTURE  (SUPERFICIAL SPECIMEN)

## 2018-12-15 LAB — AEROBIC CULTURE W GRAM STAIN (SUPERFICIAL SPECIMEN): Gram Stain: NONE SEEN

## 2018-12-17 ENCOUNTER — Ambulatory Visit: Payer: Self-pay

## 2018-12-17 ENCOUNTER — Ambulatory Visit
Admission: RE | Admit: 2018-12-17 | Discharge: 2018-12-17 | Disposition: A | Discharge status: Home / Self Care | Payer: Medicare HMO | Source: Ambulatory Visit | Attending: Internal Medicine | Admitting: Internal Medicine

## 2018-12-17 ENCOUNTER — Ambulatory Visit
Admission: RE | Admit: 2018-12-17 | Discharge: 2018-12-17 | Disposition: A | Discharge status: Home / Self Care | Payer: Medicare HMO | Attending: Vascular Surgery | Admitting: Vascular Surgery

## 2018-12-17 ENCOUNTER — Other Ambulatory Visit: Payer: Self-pay | Admitting: Internal Medicine

## 2018-12-17 ENCOUNTER — Other Ambulatory Visit (INDEPENDENT_AMBULATORY_CARE_PROVIDER_SITE_OTHER): Payer: Self-pay | Admitting: Nurse Practitioner

## 2018-12-17 ENCOUNTER — Other Ambulatory Visit: Payer: Self-pay

## 2018-12-17 ENCOUNTER — Encounter
Admission: RE | Disposition: A | Discharge status: Home / Self Care | Payer: Self-pay | Source: Home / Self Care | Attending: Vascular Surgery

## 2018-12-17 ENCOUNTER — Encounter: Payer: Self-pay | Admitting: *Deleted

## 2018-12-17 DIAGNOSIS — I70299 Other atherosclerosis of native arteries of extremities, unspecified extremity: Secondary | ICD-10-CM

## 2018-12-17 DIAGNOSIS — F17210 Nicotine dependence, cigarettes, uncomplicated: Secondary | ICD-10-CM | POA: Diagnosis not present

## 2018-12-17 DIAGNOSIS — Z794 Long term (current) use of insulin: Secondary | ICD-10-CM | POA: Insufficient documentation

## 2018-12-17 DIAGNOSIS — I70244 Atherosclerosis of native arteries of left leg with ulceration of heel and midfoot: Secondary | ICD-10-CM | POA: Diagnosis not present

## 2018-12-17 DIAGNOSIS — H409 Unspecified glaucoma: Secondary | ICD-10-CM | POA: Diagnosis not present

## 2018-12-17 DIAGNOSIS — Z823 Family history of stroke: Secondary | ICD-10-CM | POA: Insufficient documentation

## 2018-12-17 DIAGNOSIS — K219 Gastro-esophageal reflux disease without esophagitis: Secondary | ICD-10-CM | POA: Diagnosis not present

## 2018-12-17 DIAGNOSIS — Z881 Allergy status to other antibiotic agents status: Secondary | ICD-10-CM | POA: Insufficient documentation

## 2018-12-17 DIAGNOSIS — E11621 Type 2 diabetes mellitus with foot ulcer: Secondary | ICD-10-CM | POA: Insufficient documentation

## 2018-12-17 DIAGNOSIS — Z833 Family history of diabetes mellitus: Secondary | ICD-10-CM | POA: Diagnosis not present

## 2018-12-17 DIAGNOSIS — Z8249 Family history of ischemic heart disease and other diseases of the circulatory system: Secondary | ICD-10-CM | POA: Insufficient documentation

## 2018-12-17 DIAGNOSIS — I129 Hypertensive chronic kidney disease with stage 1 through stage 4 chronic kidney disease, or unspecified chronic kidney disease: Secondary | ICD-10-CM | POA: Diagnosis not present

## 2018-12-17 DIAGNOSIS — I70245 Atherosclerosis of native arteries of left leg with ulceration of other part of foot: Secondary | ICD-10-CM | POA: Diagnosis not present

## 2018-12-17 DIAGNOSIS — E1122 Type 2 diabetes mellitus with diabetic chronic kidney disease: Secondary | ICD-10-CM | POA: Diagnosis not present

## 2018-12-17 DIAGNOSIS — L97909 Non-pressure chronic ulcer of unspecified part of unspecified lower leg with unspecified severity: Secondary | ICD-10-CM

## 2018-12-17 DIAGNOSIS — Z79899 Other long term (current) drug therapy: Secondary | ICD-10-CM | POA: Diagnosis not present

## 2018-12-17 DIAGNOSIS — L89620 Pressure ulcer of left heel, unstageable: Secondary | ICD-10-CM | POA: Diagnosis not present

## 2018-12-17 DIAGNOSIS — E785 Hyperlipidemia, unspecified: Secondary | ICD-10-CM | POA: Diagnosis not present

## 2018-12-17 DIAGNOSIS — M1990 Unspecified osteoarthritis, unspecified site: Secondary | ICD-10-CM | POA: Diagnosis not present

## 2018-12-17 DIAGNOSIS — L97529 Non-pressure chronic ulcer of other part of left foot with unspecified severity: Secondary | ICD-10-CM | POA: Diagnosis not present

## 2018-12-17 DIAGNOSIS — I743 Embolism and thrombosis of arteries of the lower extremities: Secondary | ICD-10-CM

## 2018-12-17 DIAGNOSIS — B999 Unspecified infectious disease: Secondary | ICD-10-CM

## 2018-12-17 DIAGNOSIS — N183 Chronic kidney disease, stage 3 (moderate): Secondary | ICD-10-CM | POA: Insufficient documentation

## 2018-12-17 HISTORY — PX: LOWER EXTREMITY ANGIOGRAPHY: CATH118251

## 2018-12-17 HISTORY — DX: Peripheral vascular disease, unspecified: I73.9

## 2018-12-17 LAB — GLUCOSE, CAPILLARY
Glucose-Capillary: 114 mg/dL — ABNORMAL HIGH (ref 70–99)
Glucose-Capillary: 108 mg/dL — ABNORMAL HIGH (ref 70–99)

## 2018-12-17 LAB — CREATININE, SERUM
CREATININE: 1.04 mg/dL — AB (ref 0.44–1.00)
GFR calc Af Amer: >60 mL/min (ref >60)
GFR calc non Af Amer: >60 mL/min (ref >60)

## 2018-12-17 LAB — BUN: BUN: 15 mg/dL (ref 6–20)

## 2018-12-17 SURGERY — LOWER EXTREMITY ANGIOGRAPHY
Anesthesia: Moderate Sedation | Laterality: Left

## 2018-12-17 MED ORDER — CLOPIDOGREL BISULFATE 75 MG PO TABS: 75.0000 mg | ORAL_TABLET | Freq: Every day | ORAL | 11 refills | Status: DC

## 2018-12-17 MED ORDER — FENTANYL CITRATE (PF) 100 MCG/2ML IJ SOLN
INTRAMUSCULAR | Status: DC | PRN
  Administered 2018-12-17: 50 ug via INTRAVENOUS
  Administered 2018-12-17: 25 ug via INTRAVENOUS
  Administered 2018-12-17 (×2): 50 ug via INTRAVENOUS

## 2018-12-17 MED ORDER — FENTANYL CITRATE (PF) 100 MCG/2ML IJ SOLN
INTRAMUSCULAR | Status: AC
  Filled 2018-12-17: qty 2

## 2018-12-17 MED ORDER — SODIUM CHLORIDE 0.9 % IV SOLN: 250.0000 mL | INTRAVENOUS | Status: DC | PRN

## 2018-12-17 MED ORDER — MIDAZOLAM HCL 2 MG/ML PO SYRP
ORAL_SOLUTION | ORAL | Status: AC
  Filled 2018-12-17: qty 4

## 2018-12-17 MED ORDER — ACETAMINOPHEN 325 MG PO TABS: 650.0000 mg | ORAL_TABLET | ORAL | Status: DC | PRN

## 2018-12-17 MED ORDER — CLOPIDOGREL BISULFATE 75 MG PO TABS
150.0000 mg | ORAL_TABLET | Freq: Once | ORAL | Status: AC
  Administered 2018-12-17: 150 mg via ORAL

## 2018-12-17 MED ORDER — IOPAMIDOL (ISOVUE-300) INJECTION 61%
INTRAVENOUS | Status: DC | PRN
  Administered 2018-12-17: 70 mL via INTRA_ARTERIAL

## 2018-12-17 MED ORDER — METHYLPREDNISOLONE SODIUM SUCC 125 MG IJ SOLR: 125.0000 mg | INTRAMUSCULAR | Status: DC | PRN

## 2018-12-17 MED ORDER — HEPARIN (PORCINE) IN NACL 1000-0.9 UT/500ML-% IV SOLN
INTRAVENOUS | Status: AC
  Filled 2018-12-17: qty 1000

## 2018-12-17 MED ORDER — SODIUM CHLORIDE 0.9 % IV SOLN
INTRAVENOUS | Status: DC
  Administered 2018-12-17: 10:00:00 via INTRAVENOUS

## 2018-12-17 MED ORDER — HEPARIN SODIUM (PORCINE) 1000 UNIT/ML IJ SOLN
INTRAMUSCULAR | Status: DC | PRN
  Administered 2018-12-17: 5000 [IU] via INTRAVENOUS

## 2018-12-17 MED ORDER — HYDROMORPHONE HCL 1 MG/ML IJ SOLN
INTRAMUSCULAR | Status: AC
  Filled 2018-12-17: qty 1

## 2018-12-17 MED ORDER — LIDOCAINE-EPINEPHRINE (PF) 1 %-1:200000 IJ SOLN
INTRAMUSCULAR | Status: AC
  Filled 2018-12-17: qty 10

## 2018-12-17 MED ORDER — MIDAZOLAM HCL 2 MG/2ML IJ SOLN
INTRAMUSCULAR | Status: DC | PRN
  Administered 2018-12-17: 1 mg via INTRAVENOUS
  Administered 2018-12-17 (×2): 2 mg via INTRAVENOUS
  Administered 2018-12-17: 1 mg via INTRAVENOUS

## 2018-12-17 MED ORDER — CLOPIDOGREL BISULFATE 75 MG PO TABS: 150.0000 mg | ORAL_TABLET | Freq: Once | ORAL | 0 refills | Status: AC

## 2018-12-17 MED ORDER — CLOPIDOGREL BISULFATE 75 MG PO TABS: 75.0000 mg | ORAL_TABLET | Freq: Every day | ORAL | Status: DC

## 2018-12-17 MED ORDER — CLOPIDOGREL BISULFATE 75 MG PO TABS
ORAL_TABLET | ORAL | Status: AC
  Filled 2018-12-17: qty 2

## 2018-12-17 MED ORDER — ALTEPLASE 2 MG IJ SOLR
INTRAMUSCULAR | Status: AC
  Filled 2018-12-17: qty 6

## 2018-12-17 MED ORDER — LABETALOL HCL 5 MG/ML IV SOLN: 10.0000 mg | INTRAVENOUS | Status: DC | PRN

## 2018-12-17 MED ORDER — ONDANSETRON HCL 4 MG/2ML IJ SOLN: 4.0000 mg | Freq: Four times a day (QID) | INTRAMUSCULAR | Status: DC | PRN

## 2018-12-17 MED ORDER — ASPIRIN EC 81 MG PO TBEC: 81.0000 mg | DELAYED_RELEASE_TABLET | Freq: Every day | ORAL | Status: DC

## 2018-12-17 MED ORDER — CEFAZOLIN SODIUM-DEXTROSE 2-4 GM/100ML-% IV SOLN
INTRAVENOUS | Status: AC
  Administered 2018-12-17: 2 g via INTRAVENOUS
  Filled 2018-12-17: qty 100

## 2018-12-17 MED ORDER — MIDAZOLAM HCL 5 MG/5ML IJ SOLN
INTRAMUSCULAR | Status: AC
  Filled 2018-12-17: qty 5

## 2018-12-17 MED ORDER — SODIUM CHLORIDE 0.9% FLUSH: 3.0000 mL | INTRAVENOUS | Status: DC | PRN

## 2018-12-17 MED ORDER — ASPIRIN EC 81 MG PO TBEC: 81.0000 mg | DELAYED_RELEASE_TABLET | Freq: Every day | ORAL | 2 refills | Status: DC

## 2018-12-17 MED ORDER — ALTEPLASE 2 MG IJ SOLR
INTRAMUSCULAR | Status: DC | PRN
  Administered 2018-12-17: 6 mg

## 2018-12-17 MED ORDER — HEPARIN SODIUM (PORCINE) 1000 UNIT/ML IJ SOLN
INTRAMUSCULAR | Status: AC
  Filled 2018-12-17: qty 1

## 2018-12-17 MED ORDER — HYDRALAZINE HCL 20 MG/ML IJ SOLN: 5.0000 mg | INTRAMUSCULAR | Status: DC | PRN

## 2018-12-17 MED ORDER — FAMOTIDINE 20 MG PO TABS: 40.0000 mg | ORAL_TABLET | ORAL | Status: DC | PRN

## 2018-12-17 MED ORDER — CEFAZOLIN SODIUM-DEXTROSE 2-4 GM/100ML-% IV SOLN
2.0000 g | Freq: Once | INTRAVENOUS | Status: AC
  Administered 2018-12-17: 2 g via INTRAVENOUS
  Filled 2018-12-17: qty 100

## 2018-12-17 MED ORDER — MIDAZOLAM HCL 2 MG/ML PO SYRP
8.0000 mg | ORAL_SOLUTION | Freq: Once | ORAL | Status: AC
  Administered 2018-12-17: 8 mg via ORAL

## 2018-12-17 MED ORDER — HYDROMORPHONE HCL 1 MG/ML IJ SOLN
1.0000 mg | Freq: Once | INTRAMUSCULAR | Status: AC | PRN
  Administered 2018-12-17: 1 mg via INTRAVENOUS

## 2018-12-17 MED ORDER — SODIUM CHLORIDE 0.9 % IV SOLN: INTRAVENOUS | Status: DC

## 2018-12-17 MED ORDER — SODIUM CHLORIDE 0.9% FLUSH: 3.0000 mL | Freq: Two times a day (BID) | INTRAVENOUS | Status: DC

## 2018-12-17 SURGICAL SUPPLY — 25 items
BALLN LUTONIX 5X150X130 (BALLOONS) ×3
BALLN LUTONIX 5X220X130 (BALLOONS) ×3
BALLN ULTRVRSE 2.5X300X150 (BALLOONS) ×3
BALLN ULTRVRSE 5X250X130 (BALLOONS) ×3
BALLOON LUTONIX 5X150X130 (BALLOONS) ×1 IMPLANT
BALLOON LUTONIX 5X220X130 (BALLOONS) ×1 IMPLANT
BALLOON ULTRVRSE 2.5X300X150 (BALLOONS) ×1 IMPLANT
BALLOON ULTRVRSE 5X250X130 (BALLOONS) ×1 IMPLANT
CANISTER PENUMBRA ENGINE (MISCELLANEOUS) ×3 IMPLANT
CATH BEACON 5 .038 100 VERT TP (CATHETERS) ×3 IMPLANT
CATH INDIGO CAT6 KIT (CATHETERS) ×3 IMPLANT
CATH PIG 70CM (CATHETERS) ×3 IMPLANT
DEVICE PRESTO INFLATION (MISCELLANEOUS) ×3 IMPLANT
DEVICE STARCLOSE SE CLOSURE (Vascular Products) ×3 IMPLANT
GLIDEWIRE ADV .035X260CM (WIRE) ×3 IMPLANT
PACK ANGIOGRAPHY (CUSTOM PROCEDURE TRAY) ×3 IMPLANT
SHEATH ANL2 6FRX45 HC (SHEATH) ×3 IMPLANT
SHEATH BRITE TIP 5FRX11 (SHEATH) ×3 IMPLANT
SHEATH DESTIN RDC 6FR 45 (SHEATH) ×3 IMPLANT
STENT VIABAHN 6X250X120 (Permanent Stent) ×3 IMPLANT
SYR MEDRAD MARK V 150ML (SYRINGE) ×3 IMPLANT
TOWEL OR 17X26 4PK STRL BLUE (TOWEL DISPOSABLE) ×3 IMPLANT
TUBING CONTRAST HIGH PRESS 72 (TUBING) ×3 IMPLANT
WIRE G V18X300CM (WIRE) ×3 IMPLANT
WIRE J 3MM .035X145CM (WIRE) ×3 IMPLANT

## 2018-12-17 NOTE — Discharge Instructions (Signed)
° °  [facility] ° °Discharge Instructions ° °Lower Extremity Angiogram; Angioplasty/Stenting ° °Please refer to the following instructions for your post-procedure care. Your surgeon or physician assistant will discuss any changes with you. ° °Activity ° °Avoid lifting more than 8 pounds (1 gallons of milk) for 72 hours (3 days) after your procedure. You may walk as much as you can tolerate. It's OK to drive after 72 hours. ° °Bathing/Showering ° °You may shower the day after your procedure. If you have a bandage, you may remove it at 24- 48 hours. Clean your incision site with mild soap and water. Pat the area dry with a clean towel. ° °Diet ° °Resume your pre-procedure diet. There are no special food restrictions following this procedure. All patients with peripheral vascular disease should follow a low fat/low cholesterol diet. In order to heal from your surgery, it is CRITICAL to get adequate nutrition. Your body requires vitamins, minerals, and protein. Vegetables are the best source of vitamins and minerals. Vegetables also provide the perfect balance of protein. Processed food has little nutritional value, so try to avoid this. ° °Medications ° °Resume taking all of your medications unless your doctor tells you not to. If your incision is causing pain, you may take over-the-counter pain relievers such as acetaminophen (Tylenol) ° °Follow Up ° °Follow up will be arranged at the time of your procedure. You may have an office visit scheduled or may be scheduled for surgery. Ask your surgeon if you have any questions. ° °Please call us immediately for any of the following conditions: °•Severe or worsening pain your legs or feet at rest or with walking. °•Increased pain, redness, drainage at your groin puncture site. °•Fever of 101 degrees or higher. °•If you have any mild or slow bleeding from your puncture site: lie down, apply firm constant pressure over the area with a piece of  gauze or a clean wash cloth for 30 minutes- no peeking!, call 911 right away if you are still bleeding after 30 minutes, or if the bleeding is heavy and unmanageable. ° °Reduce your risk factors of vascular disease: ° °Stop smoking. If you would like help call [facility] at 1-800-QUIT-NOW (1-800-784-8669) or [employer] at 336-586-4000. °Manage your cholesterol °Maintain a desired weight °Control your diabetes °Keep your blood pressure down ° °If you have any questions, please call the office at [phone] ° °

## 2018-12-17 NOTE — H&P (Signed)
[facility] History & Physical Update  The patient was interviewed and re-examined.  The patient's previous History and Physical has been reviewed and is unchanged.  There is no change in the plan of care. We plan to proceed with the scheduled procedure.  Festus Barren, MD  12/17/2018, 9:41 AM

## 2018-12-17 NOTE — Op Note (Signed)
[facility]  Percutaneous Study/Intervention Procedural Note   Date of Surgery: 12/17/2018  Surgeon(s):Neela Zecca    Assistants:none  Pre-operative Diagnosis: PAD with ulceration left lower extremity  Post-operative diagnosis:  Same  Procedure(s) Performed:             1.  Ultrasound guidance for vascular access right femoral artery             2.  Catheter placement into left common femoral artery from right femoral approach             3.  Aortogram and selective left lower extremity angiogram             4.   Catheter directed thrombolytic therapy to the left SFA with 6 mg of TPA             5.   Percutaneous transluminal angioplasty of the left SFA and above-knee popliteal artery with a 5 mm diameter by 22 and a 5 mm diameter by 15 cm length Lutonix drug-coated angioplasty balloon  6.  Mechanical thrombectomy with the penumbra cat 6 device to the left SFA, popliteal artery, tibioperoneal trunk, and posterior tibial artery  7.  Percutaneous transluminal angioplasty of the left posterior tibial artery with 2.5 mm diameter by 30 cm length angioplasty balloon  8.  Viabahn stent placement to the left SFA for areas of residual stenosis and thrombosis after angioplasty with 6 mm diameter by 25 cm length stent             9.  StarClose closure device right femoral artery  EBL: 250  Contrast: 70 cc  Fluoro Time: 4.4 minutes  Moderate Conscious Sedation Time: approximately 45 minutes using 6 mg of Versed and 175 Mcg of Fentanyl              Indications:  Patient is a 57 y.o.female with nonhealing ulceration of the left foot. The patient has noninvasive study showing monophasic flow distally with poor perfusion. The patient is brought in for angiography for further evaluation and potential treatment.  Due to the limb threatening nature of the situation, angiogram was performed for attempted limb salvage. The patient is aware that if the procedure fails, amputation  would be expected.  The patient also understands that even with successful revascularization, amputation may still be required due to the severity of the situation.  Risks and benefits are discussed and informed consent is obtained.   Procedure:  The patient was identified and appropriate procedural time out was performed.  The patient was then placed supine on the table and prepped and draped in the usual sterile fashion. Moderate conscious sedation was administered during a face to face encounter with the patient throughout the procedure with my supervision of the RN administering medicines and monitoring the patient's vital signs, pulse oximetry, telemetry and mental status throughout from the start of the procedure until the patient was taken to the recovery room. Ultrasound was used to evaluate the right common femoral artery.  It was patent .  A digital ultrasound image was acquired.  A Seldinger needle was used to access the right common femoral artery under direct ultrasound guidance and a permanent image was performed.  A 0.035 J wire was advanced without resistance and a 5Fr sheath was placed.  Pigtail catheter was placed into the aorta and an AP aortogram was performed. This demonstrated normal renal arteries and normal aorta and iliac segments without significant stenosis. I then crossed the aortic  bifurcation and advanced to the left femoral head. Selective left lower extremity angiogram was then performed. This demonstrated normal common femoral artery and profunda femoris artery.  The SFA had an abrupt occlusion about 2 cm beyond its origin that did not look like typical atherosclerotic disease and was more concerning of a thrombotic process.  There was reconstitution of the above-knee popliteal artery but again there appeared to be some haziness concerning for thrombus at the reconstitution site.  Although the flow was relatively sluggish and difficult to see, there appeared to be at least  two-vessel runoff distally on the initial imaging. It was felt that it was in the patient's best interest to proceed with intervention after these images to avoid a second procedure and a larger amount of contrast and fluoroscopy based off of the findings from the initial angiogram. The patient was systemically heparinized and a 6 [place] [employer] sheath was then placed over the [employer] wire. I then used a Kumpe catheter and the advantage wire to navigate through the SFA occlusion and confirm intraluminal flow in the below-knee popliteal artery.  The wire passed without any resistance and did not really need catheter support also consistent with a thrombotic process.  As such, I elected to give 6 mg of TPA which was injected into the left SFA and allowed to dwell.  I then proceeded with angioplasty of the left SFA and above-knee popliteal arteries.  2 5 mm diameter Lutonix drug-coated angioplasty balloons were used to treat from the above-knee popliteal artery up to the origin of the SFA.  Both were inflated to about 12 atm for 1 minute.  Imaging following this showed essentially no flow distally with thrombus in the SFA, popliteal, and tibioperoneal trunk.  I then selected the penumbra cat 6 device and made multiple passes returning large amounts of thrombus.  The passes were through the left SFA, popliteal artery, tibioperoneal trunk, and proximal posterior tibial artery.  Following multiple passes, there was occlusive thrombus in the left posterior tibial artery but the peroneal artery was patent.  The anterior tibial artery appeared to be small.  There was a greater than 70% residual stenosis in the mid SFA with some residual thrombus as well as some thrombus and 50% stenosis in the more proximal SFA.  I made passes down into the posterior tibial artery with some return of thrombus with the penumbra cat 6 device but there remained occlusion and poor flow.  As such, I used a 2.5 mm diameter by 30 cm  length angioplasty balloon balloon from the posterior tibial artery at the foot and ankle up to the proximal posterior tibial artery.  This was taken to 10 atm for 1 minute.  A Viabahn covered stent was used in the left SFA to treat the residual stenosis and thrombus.  This was a 6 mm diameter by 25 cm length Viabahn stent postdilated with a 5 mm balloon with less than 10% residual stenosis after stent placement.  The posterior tibial artery was now continuous into the foot with less than 20% residual stenosis as well. I elected to terminate the procedure. The sheath was removed and StarClose closure device was deployed in the right femoral artery with excellent hemostatic result. The patient was taken to the recovery room in stable condition having tolerated the procedure well.  Findings:               Aortogram:  Normal renal arteries bilaterally, normal aorta and not significant stenosis  Left lower Extremity:  Reasonably normal common femoral artery and profunda femoris artery.  The SFA had an abrupt occlusion about 2 cm beyond its origin that did not look like typical atherosclerotic disease and was more concerning of a thrombotic process.  There was reconstitution of the above-knee popliteal artery but again there appeared to be some haziness concerning for thrombus at the reconstitution site.  Although the flow was relatively sluggish and difficult to see, there appeared to be at least two-vessel runoff distally on the initial imaging.   Disposition: Patient was taken to the recovery room in stable condition having tolerated the procedure well.  Complications: None  Leotis Pain 12/17/2018 12:52 PM   This note was created with Dragon Medical transcription system. Any errors in dictation are purely unintentional.

## 2018-12-19 NOTE — Progress Notes (Signed)
[name], [name] ([id number]) Visit Report for 12/12/2018 Arrival Information Details Patient Name: MICHOLE, SALVI Date of Service: 12/12/2018 9:30 AM Medical Record Number: [id number] Patient Account Number: [id number] Date of Birth/Sex: 1961/05/28 (57 y.o. F) Treating RN: Arnette Norris Primary Care Hamdi Kley: Joen [name] Other Clinician: Referring Honestie Kulik: Joen [name] Treating Reynold Mantell/Extender: Altamese [place] in Treatment: 4 Visit Information History Since Last Visit Added or deleted any medications: No Patient Arrived: Wheel Chair Any new allergies or adverse reactions: No Arrival Time: 09:16 Had a fall or experienced change in No Accompanied By: self activities of daily living that may affect Transfer Assistance: None risk of falls: Patient Identification Verified: Yes Signs or symptoms of abuse/neglect since last visito No Secondary Verification Process Completed: Yes Hospitalized since last visit: No Has Dressing in Place as Prescribed: Yes Pain Present Now: No Electronic Signature(s) Signed: 12/18/2018 1:37:43 PM By: Arnette Norris Entered By: Arnette Norris on 12/12/2018 09:18:43 Dymek, [name] ([id number]) -------------------------------------------------------------------------------- Encounter Discharge Information Details Patient Name: [name] Date of Service: 12/12/2018 9:30 AM Medical Record Number: [id number] Patient Account Number: [id number] Date of Birth/Sex: 11-13-1961 (57 y.o. F) Treating RN: Rema Jasmine Primary Care Nazirah Tri: Joen [name] Other Clinician: Referring Myya Meenach: Joen [name] Treating Estephania Licciardi/Extender: Altamese [place] in Treatment: 4 Encounter Discharge Information Items Post Procedure Vitals Discharge Condition: Stable Temperature (F): 98.4 Ambulatory Status: Wheelchair Pulse (bpm): 104 Discharge Destination: Home Respiratory Rate (breaths/min): 18 Transportation: Private Auto Blood Pressure (mmHg):  135/73 Accompanied By: self Schedule Follow-up Appointment: Yes Clinical Summary of Care: Electronic Signature(s) Signed: 12/12/2018 10:49:56 AM By: Rema Jasmine Entered By: Rema Jasmine on 12/12/2018 09:54:12 [place], [name] ([id number]) -------------------------------------------------------------------------------- Lower Extremity Assessment Details Patient Name: [name] Date of Service: 12/12/2018 9:30 AM Medical Record Number: [id number] Patient Account Number: [id number] Date of Birth/Sex: [birth year] (57 y.o. F) Treating RN: Arnette Norris Primary Care Akelia Husted: Joen [name] Other Clinician: Referring Nahmir Zeidman: Joen [name] Treating Kelsey Durflinger/Extender: Altamese [place] in Treatment: 4 Edema Assessment Assessed: [Left: No] [Right: No] [Left: Edema] [Right: :] Calf Left: Right: Point of Measurement: 30 cm From Medial Instep 38.4 cm cm Ankle Left: Right: Point of Measurement: 12 cm From Medial Instep 26 cm cm Vascular Assessment Pulses: Dorsalis Pedis Palpable: [Left:No] Posterior Tibial Palpable: [Left:No] Extremity colors, hair growth, and conditions: Extremity Color: [Left:Hyperpigmented] Hair Growth on Extremity: [Left:Yes] Temperature of Extremity: [Left:Warm] Capillary Refill: [Left:> 3 seconds] Toe Nail Assessment Left: Right: Thick: Yes Discolored: Yes Deformed: Yes Improper Length and Hygiene: Yes Electronic Signature(s) Signed: 12/18/2018 1:37:43 PM By: Arnette Norris Entered By: Arnette Norris on 12/12/2018 09:29:52 [place], Ciearra Elbert Ewings ([id number]) -------------------------------------------------------------------------------- Multi Wound Chart Details Patient Name: [name] Date of Service: 12/12/2018 9:30 AM Medical Record Number: [id number] Patient Account Number: [id number] Date of Birth/Sex: [birth year] (57 y.o. F) Treating RN: Huel Coventry Primary Care Lynsey Ange: Joen [name] Other Clinician: Referring Kaliyan Osbourn: Joen [name] Treating  Sharelle Burditt/Extender: Altamese [place] in Treatment: 4 Vital Signs Height(in): 63 Pulse(bpm): 104 Weight(lbs): 246 Blood Pressure(mmHg): 135/73 Body Mass Index(BMI): 44 Temperature(F): 98.4 Respiratory Rate 18 (breaths/min): Photos: [6:No Photos] [N/A:N/A] Wound Location: [6:Left Foot - Lateral] [N/A:N/A] Wounding Event: [6:Trauma] [N/A:N/A] Primary Etiology: [6:Pressure Ulcer] [N/A:N/A] Comorbid History: [6:Asthma, Hypertension, Type II Diabetes, Neuropathy] [N/A:N/A] Date Acquired: [6:09/04/2018] [N/A:N/A] Weeks of Treatment: [6:4] [N/A:N/A] Wound Status: [6:Open] [N/A:N/A] Measurements L x W x D [6:5.5x3x0.2] [N/A:N/A] (cm) Area (cm) : [6:12.959] [N/A:N/A] Volume (cm) : [6:2.592] [N/A:N/A] % Reduction in Area: [6:-275.00%] [N/A:N/A] % Reduction in Volume: [6:-150.00%] [N/A:N/A] Classification: [  6:Category/Stage II] [N/A:N/A] Exudate Amount: [6:Large] [N/A:N/A] Exudate Type: [6:Serosanguineous] [N/A:N/A] Exudate Color: [6:red, brown] [N/A:N/A] Foul Odor After Cleansing: [6:Yes] [N/A:N/A] Odor Anticipated Due to [6:No] [N/A:N/A] Product Use: Wound Margin: [6:Well defined, not attached] [N/A:N/A] Granulation Amount: [6:None Present (0%)] [N/A:N/A] Necrotic Amount: [6:Large (67-100%)] [N/A:N/A] Necrotic Tissue: [6:Eschar, Adherent Slough] [N/A:N/A] Exposed Structures: [6:Fat Layer (Subcutaneous Tissue) Exposed: Yes Fascia: No Tendon: No Muscle: No Joint: No Bone: No] [N/A:N/A] Epithelialization: [6:None] [N/A:N/A] Debridement: [6:Debridement - Selective/Open Wound 09:35] [N/A:N/A N/A] Pre-procedure Verification/Time Out Taken: Pain Control: Lidocaine N/A N/A Level: Skin/Epidermis N/A N/A Debridement Area (sq cm): 16.5 N/A N/A Instrument: Blade, Forceps N/A N/A Bleeding: None N/A N/A Hemostasis Achieved: Pressure N/A N/A Debridement Treatment Procedure was tolerated well N/A N/A Response: Post Debridement 5.5x3x0 N/A N/A Measurements L x W x D (cm) Post  Debridement Volume: 0 N/A N/A (cm) Post Debridement Stage: Category/Stage II N/A N/A Periwound Skin Texture: Callus: Yes N/A N/A Excoriation: No Induration: No Crepitus: No Rash: No Scarring: No Periwound Skin Moisture: Dry/Scaly: Yes N/A N/A Maceration: No Periwound Skin Color: Erythema: Yes N/A N/A Atrophie Blanche: No Cyanosis: No Ecchymosis: No Hemosiderin Staining: No Mottled: No Pallor: No Rubor: No Erythema Measurement: Marked N/A N/A Temperature: No Abnormality N/A N/A Tenderness on Palpation: Yes N/A N/A Wound Preparation: Ulcer Cleansing: N/A N/A Rinsed/Irrigated with Saline Topical Anesthetic Applied: Other: lidocaine 4% Procedures Performed: Debridement N/A N/A Treatment Notes Electronic Signature(s) Signed: 12/12/2018 5:55:34 PM By: Baltazar Najjar MD Entered By: Baltazar Najjar on 12/12/2018 09:48:56 [place], [name] ([id number]) -------------------------------------------------------------------------------- Multi-Disciplinary Care Plan Details Patient Name: [name] Date of Service: 12/12/2018 9:30 AM Medical Record Number: [id number] Patient Account Number: [id number] Date of Birth/Sex: [birth year] (57 y.o. F) Treating RN: Huel Coventry Primary Care Lewanna Petrak: Joen [name] Other Clinician: Referring Nivin Braniff: Joen [name] Treating Offie Waide/Extender: Altamese [place] in Treatment: 4 Active Inactive Necrotic Tissue Nursing Diagnoses: Impaired tissue integrity related to necrotic/devitalized tissue Goals: Necrotic/devitalized tissue will be minimized in the wound bed Date Initiated: 11/14/2018 Target Resolution Date: 11/28/2018 Goal Status: Active Patient/caregiver will verbalize understanding of reason and process for debridement of necrotic tissue Date Initiated: 11/14/2018 Target Resolution Date: 11/28/2018 Goal Status: Active Interventions: Assess patient pain level pre-, during and post procedure and prior to discharge Provide  education on necrotic tissue and debridement process Treatment Activities: Apply topical anesthetic as ordered : 11/14/2018 Notes: Orientation to the Wound Care Program Nursing Diagnoses: Knowledge deficit related to the wound healing center program Goals: Patient/caregiver will verbalize understanding of the Wound Healing Center Program Date Initiated: 11/14/2018 Target Resolution Date: 11/28/2018 Goal Status: Active Interventions: Provide education on orientation to the wound center Notes: Pain, Acute or Chronic Nursing Diagnoses: Pain Management - Non-cyclic Chronic Pain Markovic, Hasel L. ([id number]) Goals: Patient/caregiver will verbalize adequate pain control between visits Date Initiated: 11/14/2018 Target Resolution Date: 11/28/2018 Goal Status: Active Interventions: Complete pain assessment as per visit requirements Notes: Pressure Nursing Diagnoses: Knowledge deficit related to management of pressures ulcers Goals: Patient/caregiver will verbalize understanding of pressure ulcer management Date Initiated: 11/14/2018 Target Resolution Date: 11/28/2018 Goal Status: Active Interventions: Assess: immobility, friction, shearing, incontinence upon admission and as needed Notes: Wound/Skin Impairment Nursing Diagnoses: Impaired tissue integrity Goals: Ulcer/skin breakdown will have a volume reduction of 80% by week 12 Date Initiated: 11/14/2018 Target Resolution Date: 02/14/2019 Goal Status: Active Interventions: Assess ulceration(s) every visit Treatment Activities: Skin care regimen initiated : 11/14/2018 Notes: Electronic Signature(s) Signed: 12/12/2018 5:32:05 PM By: Elliot Gurney, BSN, RN, CWS, Kim RN, BSN Entered By:  Elliot Gurney, BSN, RN, CWS, Kim on 12/12/2018 09:36:51 Peeler, [name] ([id number]) -------------------------------------------------------------------------------- Pain Assessment Details Patient Name: ASHE, BENGSTON Date of Service: 12/12/2018 9:30 AM Medical  Record Number: [id number] Patient Account Number: [id number] Date of Birth/Sex: 1961/01/22 (57 y.o. F) Treating RN: Arnette Norris Primary Care Jace Fermin: Joen [name] Other Clinician: Referring Mabeline Varas: Joen [name] Treating Heyli Min/Extender: Altamese [place] in Treatment: 4 Active Problems Location of Pain Severity and Description of Pain Patient Has Paino No Site Locations Pain Management and Medication Current Pain Management: Electronic Signature(s) Signed: 12/18/2018 1:37:43 PM By: Arnette Norris Entered By: Arnette Norris on 12/12/2018 09:18:51 Russum, [name] ([id number]) -------------------------------------------------------------------------------- Patient/Caregiver Education Details Patient Name: [name] Date of Service: 12/12/2018 9:30 AM Medical Record Number: [id number] Patient Account Number: [id number] Date of Birth/Gender: 1961/12/25 (57 y.o. F) Treating RN: Huel Coventry Primary Care Physician: Joen [name] Other Clinician: Referring Physician: Joen [name] Treating Physician/Extender: Altamese [place] in Treatment: 4 Education Assessment Education Provided To: Patient Education Topics Provided Wound Debridement: Handouts: Wound Debridement Methods: Demonstration, Explain/Verbal Responses: State content correctly Wound/Skin Impairment: Handouts: Caring for Your Ulcer Methods: Demonstration, Explain/Verbal Responses: State content correctly Electronic Signature(s) Signed: 12/12/2018 10:49:56 AM By: Rema Jasmine Entered By: Rema Jasmine on 12/12/2018 09:54:25 Dodds, Scheryl L. ([id number]) -------------------------------------------------------------------------------- Wound Assessment Details Patient Name: [name] Date of Service: 12/12/2018 9:30 AM Medical Record Number: [id number] Patient Account Number: [id number] Date of Birth/Sex: 04/15/1961 (57 y.o. F) Treating RN: Arnette Norris Primary Care Mahonri Seiden: Joen [name] Other  Clinician: Referring Chelcey Caputo: Joen [name] Treating Sashia Campas/Extender: Altamese [place] in Treatment: 4 Wound Status Wound Number: 6 Primary Pressure Ulcer Etiology: Wound Location: Left Foot - Lateral Wound Status: Open Wounding Event: Trauma Comorbid Asthma, Hypertension, Type II Diabetes, Date Acquired: 09/04/2018 History: Neuropathy Weeks Of Treatment: 4 Clustered Wound: No Photos Photo Uploaded By: Arnette Norris on 12/12/2018 11:58:12 Wound Measurements Length: (cm) 5.5 Width: (cm) 3 Depth: (cm) 0.2 Area: (cm) 12.959 Volume: (cm) 2.592 % Reduction in Area: -275% % Reduction in Volume: -150% Epithelialization: None Tunneling: No Undermining: No Wound Description Classification: Category/Stage II Wound Margin: Well defined, not attached Exudate Amount: Large Exudate Type: Serosanguineous Exudate Color: red, brown Foul Odor After Cleansing: Yes Due to Product Use: No Slough/Fibrino Yes Wound Bed Granulation Amount: None Present (0%) Exposed Structure Necrotic Amount: Large (67-100%) Fascia Exposed: No Necrotic Quality: Eschar, Adherent Slough Fat Layer (Subcutaneous Tissue) Exposed: Yes Tendon Exposed: No Muscle Exposed: No Joint Exposed: No Bone Exposed: No Periwound Skin Texture Perrier, Hermena L. ([id number]) Texture Color No Abnormalities Noted: No No Abnormalities Noted: No Callus: Yes Atrophie Blanche: No Crepitus: No Cyanosis: No Excoriation: No Ecchymosis: No Induration: No Erythema: Yes Rash: No Erythema Measurement: Marked Scarring: No Hemosiderin Staining: No Mottled: No Moisture Pallor: No No Abnormalities Noted: No Rubor: No Dry / Scaly: Yes Maceration: No Temperature / Pain Temperature: No Abnormality Tenderness on Palpation: Yes Wound Preparation Ulcer Cleansing: Rinsed/Irrigated with Saline Topical Anesthetic Applied: Other: lidocaine 4%, Treatment Notes Wound #6 (Left, Lateral Foot) 1. Cleansed with: Clean  wound with Normal Saline 2. Anesthetic Other (specify in notes) Notes silvercell, gauze, heel cup Electronic Signature(s) Signed: 12/18/2018 1:37:43 PM By: Arnette Norris Entered By: Arnette Norris on 12/12/2018 09:28:10 Ligas, [name] ([id number]) -------------------------------------------------------------------------------- Vitals Details Patient Name: [name] Date of Service: 12/12/2018 9:30 AM Medical Record Number: [id number] Patient Account Number: [id number] Date of Birth/Sex: 26-Jan-1961 (57 y.o. F) Treating RN: Arnette Norris Primary Care Eliany Mccarter: Joen [name] Other Clinician: Referring Sakari Alkhatib:  BLISS, [name] Treating Audreyanna Butkiewicz/Extender: Maxwell Caul We[place] in Treatment: 4 Vital Signs Time Taken: 09:20 Temperature (F): 98.4 Height (in): 63 Pulse (bpm): 104 Weight (lbs): 246 Respiratory Rate (breaths/min): 18 Body Mass Index (BMI): 43.6 Blood Pressure (mmHg): 135/73 Reference Range: 80 - 120 mg / dl Electronic Signature(s) Signed: 12/18/2018 1:37:43 PM By: Arnette Norris Entered By: Arnette Norris on 12/12/2018 67:12:45

## 2018-12-21 ENCOUNTER — Other Ambulatory Visit: Payer: Medicare HMO

## 2018-12-24 ENCOUNTER — Ambulatory Visit: Payer: Self-pay

## 2018-12-24 ENCOUNTER — Encounter: Payer: Medicare HMO | Admitting: Family Medicine

## 2018-12-24 DIAGNOSIS — E11621 Type 2 diabetes mellitus with foot ulcer: Secondary | ICD-10-CM | POA: Diagnosis not present

## 2018-12-26 NOTE — Progress Notes (Signed)
[name], [name] ([id number]) Visit Report for 12/24/2018 Arrival Information Details Patient Name: [name], [name] Date of Service: 12/24/2018 9:00 AM Medical Record Number: [id number] Patient Account Number: [id number] Date of Birth/Sex: 11/02/1961 (58 y.o. F) Treating RN: Arnette Norris Primary Care Cenia Zaragosa: Joen [name] Other Clinician: Referring Kalayna Noy: Joen [name] Treating Trudee Chirino/Extender: Youlanda Roys in Treatment: 5 Visit Information History Since Last Visit Added or deleted any medications: Yes Patient Arrived: Wheel Chair Any new allergies or adverse reactions: No Arrival Time: 08:59 Had a fall or experienced change in No Accompanied By: self activities of daily living that may affect Transfer Assistance: None risk of falls: Patient Identification Verified: Yes Signs or symptoms of abuse/neglect since last visito No Secondary Verification Process Completed: Yes Hospitalized since last visit: No Implantable device outside of the clinic excluding No cellular tissue based products placed in the center since last visit: Has Dressing in Place as Prescribed: Yes Pain Present Now: No Electronic Signature(s) Signed: 12/24/2018 3:55:25 PM By: Dayton Martes [name], RRT, CHT Entered By: Dayton Martes on 12/24/2018 09:00:57 Chicas, [name] ([id number]) -------------------------------------------------------------------------------- Clinic Level of Care Assessment Details Patient Name: [name] Date of Service: 12/24/2018 9:00 AM Medical Record Number: [id number] Patient Account Number: [id number] Date of Birth/Sex: Apr 25, 1961 (58 y.o. F) Treating RN: Arnette Norris Primary Care Nickalus Thornsberry: Joen [name] Other Clinician: Referring Jarvis Sawa: Joen [name] Treating Lavonte Palos/Extender: Youlanda Roys in Treatment: 5 Clinic Level of Care Assessment Items TOOL 4 Quantity Score []  - Use when only an EandM is performed on FOLLOW-UP visit  0 ASSESSMENTS - Nursing Assessment / Reassessment X - Reassessment of Co-morbidities (includes updates in patient status) 1 10 X- 1 5 Reassessment of Adherence to Treatment Plan ASSESSMENTS - Wound and Skin Assessment / Reassessment X - Simple Wound Assessment / Reassessment - one wound 1 5 []  - 0 Complex Wound Assessment / Reassessment - multiple wounds []  - 0 Dermatologic / Skin Assessment (not related to wound area) ASSESSMENTS - Focused Assessment []  - Circumferential Edema Measurements - multi extremities 0 []  - 0 Nutritional Assessment / Counseling / Intervention []  - 0 Lower Extremity Assessment (monofilament, tuning fork, pulses) []  - 0 Peripheral Arterial Disease Assessment (using hand held doppler) ASSESSMENTS - Ostomy and/or Continence Assessment and Care []  - Incontinence Assessment and Management 0 []  - 0 Ostomy Care Assessment and Management (repouching, etc.) PROCESS - Coordination of Care X - Simple Patient / Family Education for ongoing care 1 15 []  - 0 Complex (extensive) Patient / Family Education for ongoing care X- 1 10 Staff obtains Chiropractor, Records, Test Results / Process Orders X- 1 10 Staff telephones HHA, Nursing Homes / Clarify orders / etc []  - 0 Routine Transfer to another Facility (non-emergent condition) []  - 0 Routine Hospital Admission (non-emergent condition) []  - 0 New Admissions / Manufacturing engineer / Ordering NPWT, Apligraf, etc. []  - 0 Emergency Hospital Admission (emergent condition) X- 1 10 Simple Discharge Coordination Percifield, Corie L. ([id number]) []  - 0 Complex (extensive) Discharge Coordination PROCESS - Special Needs []  - Pediatric / Minor Patient Management 0 []  - 0 Isolation Patient Management []  - 0 Hearing / Language / Visual special needs []  - 0 Assessment of Community assistance (transportation, D/C planning, etc.) []  - 0 Additional assistance / Altered mentation []  - 0 Support Surface(s) Assessment (bed,  cushion, seat, etc.) INTERVENTIONS - Wound Cleansing / Measurement X - Simple Wound Cleansing - one wound 1 5 []  - 0 Complex Wound Cleansing - multiple wounds X-  1 5 Wound Imaging (photographs - any number of wounds) []  - 0 Wound Tracing (instead of photographs) X- 1 5 Simple Wound Measurement - one wound []  - 0 Complex Wound Measurement - multiple wounds INTERVENTIONS - Wound Dressings X - Small Wound Dressing one or multiple wounds 1 10 []  - 0 Medium Wound Dressing one or multiple wounds []  - 0 Large Wound Dressing one or multiple wounds []  - 0 Application of Medications - topical []  - 0 Application of Medications - injection INTERVENTIONS - Miscellaneous []  - External ear exam 0 []  - 0 Specimen Collection (cultures, biopsies, blood, body fluids, etc.) []  - 0 Specimen(s) / Culture(s) sent or taken to Lab for analysis []  - 0 Patient Transfer (multiple staff / Nurse, adult / Similar devices) []  - 0 Simple Staple / Suture removal (25 or less) []  - 0 Complex Staple / Suture removal (26 or more) []  - 0 Hypo / Hyperglycemic Management (close monitor of Blood Glucose) []  - 0 Ankle / Brachial Index (ABI) - do not check if billed separately X- 1 5 Vital Signs Levario, Cybill L. ([id number]) Has the patient been seen at the hospital within the last three years: Yes Total Score: 95 Level Of Care: New/Established - Level 3 Electronic Signature(s) Signed: 12/25/2018 4:38:01 PM By: Arnette Norris Entered By: Arnette Norris on 12/24/2018 09:17:11 Prout, [name] ([id number]) -------------------------------------------------------------------------------- Encounter Discharge Information Details Patient Name: [name] Date of Service: 12/24/2018 9:00 AM Medical Record Number: [id number] Patient Account Number: [id number] Date of Birth/Sex: Jul 07, 1961 (58 y.o. F) Treating RN: Arnette Norris Primary Care Daune Divirgilio: Joen [name] Other Clinician: Referring Cielle Aguila: Joen [name] Treating Davide Risdon/Extender: Youlanda Roys in Treatment: 5 Encounter Discharge Information Items Discharge Condition: Stable Ambulatory Status: Wheelchair Discharge Destination: Home Transportation: Private Auto Accompanied By: self Schedule Follow-up Appointment: Yes Clinical Summary of Care: Electronic Signature(s) Signed: 12/25/2018 4:38:01 PM By: Arnette Norris Entered By: Arnette Norris on 12/24/2018 09:31:46 Hoogendoorn, [name] ([id number]) -------------------------------------------------------------------------------- Lower Extremity Assessment Details Patient Name: [name] Date of Service: 12/24/2018 9:00 AM Medical Record Number: [id number] Patient Account Number: [id number] Date of Birth/Sex: 1961/04/18 (57 y.o. F) Treating RN: Arnette Norris Primary Care Keithan Dileonardo: Joen [name] Other Clinician: Referring Kayline Sheer: Joen [name] Treating Dayelin Balducci/Extender: Youlanda Roys in Treatment: 5 Edema Assessment Assessed: [Left: No] [Right: No] [Left: Edema] [Right: :] Calf Left: Right: Point of Measurement: 30 cm From Medial Instep 40.2 cm cm Ankle Left: Right: Point of Measurement: 12 cm From Medial Instep 25.6 cm cm Vascular Assessment Pulses: Dorsalis Pedis Palpable: [Left:No] Posterior Tibial Palpable: [Left:No] Extremity colors, hair growth, and conditions: Hair Growth on Extremity: [Left:No] Temperature of Extremity: [Left:Cool] Capillary Refill: [Left:> 3 seconds] Toe Nail Assessment Left: Right: Thick: Yes Discolored: Yes Deformed: Yes Improper Length and Hygiene: No Electronic Signature(s) Signed: 12/25/2018 4:38:01 PM By: Arnette Norris Entered By: Arnette Norris on 12/24/2018 09:11:10 Vangorder, [name] ([id number]) -------------------------------------------------------------------------------- Multi Wound Chart Details Patient Name: [name] Date of Service: 12/24/2018 9:00 AM Medical Record Number: [id number] Patient  Account Number: [id number] Date of Birth/Sex: 11-Jul-1961 (57 y.o. F) Treating RN: Arnette Norris Primary Care Daleon Willinger: Joen [name] Other Clinician: Referring Nela Bascom: Joen [name] Treating Leroy Trim/Extender: Youlanda Roys in Treatment: 5 Vital Signs Height(in): 63 Pulse(bpm): 108 Weight(lbs): 246 Blood Pressure(mmHg): 112/59 Body Mass Index(BMI): 44 Temperature(F): 99.9 Respiratory Rate 18 (breaths/min): Photos: [6:No Photos] [N/A:N/A] Wound Location: [6:Left Foot - Lateral] [N/A:N/A] Wounding Event: [6:Trauma] [N/A:N/A] Primary Etiology: [6:Pressure Ulcer] [N/A:N/A] Comorbid History: [6:Asthma, Hypertension, Type II Diabetes,  Neuropathy] [N/A:N/A] Date Acquired: [6:09/04/2018] [N/A:N/A] Weeks of Treatment: [6:5] [N/A:N/A] Wound Status: [6:Open] [N/A:N/A] Measurements L x W x D [6:5x3.5x0.2] [N/A:N/A] (cm) Area (cm) : [6:13.744] [N/A:N/A] Volume (cm) : [6:2.749] [N/A:N/A] % Reduction in Area: [6:-297.70%] [N/A:N/A] % Reduction in Volume: [6:-165.10%] [N/A:N/A] Classification: [6:Category/Stage II] [N/A:N/A] Exudate Amount: [6:Large] [N/A:N/A] Exudate Type: [6:Serosanguineous] [N/A:N/A] Exudate Color: [6:red, brown] [N/A:N/A] Foul Odor After Cleansing: [6:Yes] [N/A:N/A] Odor Anticipated Due to [6:No] [N/A:N/A] Product Use: Wound Margin: [6:Well defined, not attached] [N/A:N/A] Granulation Amount: [6:None Present (0%)] [N/A:N/A] Necrotic Amount: [6:Large (67-100%)] [N/A:N/A] Necrotic Tissue: [6:Eschar, Adherent Slough] [N/A:N/A] Exposed Structures: [6:Fat Layer (Subcutaneous Tissue) Exposed: Yes Fascia: No Tendon: No Muscle: No Joint: No Bone: No] [N/A:N/A] Epithelialization: [6:None] [N/A:N/A] Periwound Skin Texture: [6:Callus: Yes Excoriation: No Induration: No] [N/A:N/A] Crepitus: No Rash: No Scarring: No Periwound Skin Moisture: Dry/Scaly: Yes N/A N/A Maceration: No Periwound Skin Color: Erythema: Yes N/A N/A Atrophie Blanche: No Cyanosis:  No Ecchymosis: No Hemosiderin Staining: No Mottled: No Pallor: No Rubor: No Erythema Measurement: Marked N/A N/A Temperature: No Abnormality N/A N/A Tenderness on Palpation: Yes N/A N/A Wound Preparation: Ulcer Cleansing: N/A N/A Rinsed/Irrigated with Saline Topical Anesthetic Applied: Other: lidocaine 4% Treatment Notes Electronic Signature(s) Signed: 12/25/2018 4:38:01 PM By: Arnette NorrisBiell, Kristina Entered By: Arnette NorrisBiell, Kristina on 12/24/2018 09:11:29 Savell, Katelyn ShellerAMMY L. ([id number]) -------------------------------------------------------------------------------- Multi-Disciplinary Care Plan Details Patient Name: Katelyn GeorgesWARD, [name] Date of Service: 12/24/2018 9:00 AM Medical Record Number: [id number] Patient Account Number: [id number] Date of Birth/Sex: Sep 21, 1961 (57 y.o. F) Treating RN: Arnette NorrisBiell, Kristina Primary Care Kaveh Kissinger: Joen LauraBLISS, [name] Other Clinician: Referring Belen Zwahlen: Joen LauraBLISS, [name] Treating Ashely Goosby/Extender: Youlanda RoysKnight, Ikeshia Weeks in Treatment: 5 Active Inactive Necrotic Tissue Nursing Diagnoses: Impaired tissue integrity related to necrotic/devitalized tissue Goals: Necrotic/devitalized tissue will be minimized in the wound bed Date Initiated: 11/14/2018 Target Resolution Date: 11/28/2018 Goal Status: Active Patient/caregiver will verbalize understanding of reason and process for debridement of necrotic tissue Date Initiated: 11/14/2018 Target Resolution Date: 11/28/2018 Goal Status: Active Interventions: Assess patient pain level pre-, during and post procedure and prior to discharge Provide education on necrotic tissue and debridement process Treatment Activities: Apply topical anesthetic as ordered : 11/14/2018 Notes: Orientation to the Wound Care Program Nursing Diagnoses: Knowledge deficit related to the wound healing center program Goals: Patient/caregiver will verbalize understanding of the Wound Healing Center Program Date Initiated: 11/14/2018 Target Resolution Date:  11/28/2018 Goal Status: Active Interventions: Provide education on orientation to the wound center Notes: Pain, Acute or Chronic Nursing Diagnoses: Pain Management - Non-cyclic Chronic Pain Meyering, [name] ([id number]) Goals: Patient/caregiver will verbalize adequate pain control between visits Date Initiated: 11/14/2018 Target Resolution Date: 11/28/2018 Goal Status: Active Interventions: Complete pain assessment as per visit requirements Notes: Pressure Nursing Diagnoses: Knowledge deficit related to management of pressures ulcers Goals: Patient/caregiver will verbalize understanding of pressure ulcer management Date Initiated: 11/14/2018 Target Resolution Date: 11/28/2018 Goal Status: Active Interventions: Assess: immobility, friction, shearing, incontinence upon admission and as needed Notes: Wound/Skin Impairment Nursing Diagnoses: Impaired tissue integrity Goals: Ulcer/skin breakdown will have a volume reduction of 80% by week 12 Date Initiated: 11/14/2018 Target Resolution Date: 02/14/2019 Goal Status: Active Interventions: Assess ulceration(s) every visit Treatment Activities: Skin care regimen initiated : 11/14/2018 Notes: Electronic Signature(s) Signed: 12/25/2018 4:38:01 PM By: Arnette NorrisBiell, Kristina Entered By: Arnette NorrisBiell, Kristina on 12/24/2018 09:11:19 Galka, Katelyn LMarland Kitchen. ([id number]) -------------------------------------------------------------------------------- Pain Assessment Details Patient Name: Katelyn GeorgesWARD, [name] Date of Service: 12/24/2018 9:00 AM Medical Record Number: [id number] Patient Account Number: [id number] Date of Birth/Sex: Sep 21, 1961 (57 y.o. F) Treating RN: Arnette NorrisBiell, Kristina  Primary Care Gionna Polak: Joen LauraBLISS, [name] Other Clinician: Referring Blayre Papania: Joen LauraBLISS, [name] Treating Keiron Iodice/Extender: Youlanda RoysKnight, Ikeshia Weeks in Treatment: 5 Active Problems Location of Pain Severity and Description of Pain Patient Has Paino No Site Locations Pain Management and  Medication Current Pain Management: Electronic Signature(s) Signed: 12/24/2018 3:55:25 PM By: Dayton MartesWallace, [name],RRT,CHT, [name], RRT, CHT Signed: 12/25/2018 4:38:01 PM By: Arnette NorrisBiell, Kristina Entered By: Dayton MartesWallace, [name],RRT,CHT, Katelyn on 12/24/2018 09:00:49 Whitner, Katelyn ShellerAMMY L. ([id number]) -------------------------------------------------------------------------------- Patient/Caregiver Education Details Patient Name: Katelyn GeorgesWARD, [name] Date of Service: 12/24/2018 9:00 AM Medical Record Number: [id number] Patient Account Number: [id number] Date of Birth/Gender: Jan 19, 1961 (57 y.o. F) Treating RN: Arnette NorrisBiell, Kristina Primary Care Physician: Joen LauraBLISS, [name] Other Clinician: Referring Physician: Joen LauraBLISS, [name] Treating Physician/Extender: Youlanda RoysKnight, Ikeshia Weeks in Treatment: 5 Education Assessment Education Provided To: Patient Education Topics Provided Electronic Signature(s) Signed: 12/25/2018 4:38:01 PM By: Arnette NorrisBiell, Kristina Entered By: Arnette NorrisBiell, Kristina on 12/24/2018 09:31:51 Zeitz, Katelyn ShellerAMMY L. ([id number]) -------------------------------------------------------------------------------- Wound Assessment Details Patient Name: Katelyn GeorgesWARD, Reshma L. Date of Service: 12/24/2018 9:00 AM Medical Record Number: [id number] Patient Account Number: [id number] Date of Birth/Sex: Jan 19, 1961 (57 y.o. F) Treating RN: Arnette NorrisBiell, Kristina Primary Care Louana Fontenot: Joen LauraBLISS, [name] Other Clinician: Referring Michai Dieppa: Joen LauraBLISS, [name] Treating Raeden Schippers/Extender: Wilkie AyeKnight, Ikeshia Weeks in Treatment: 5 Wound Status Wound Number: 6 Primary Pressure Ulcer Etiology: Wound Location: Left Foot - Lateral Wound Status: Open Wounding Event: Trauma Comorbid Asthma, Hypertension, Type II Diabetes, Date Acquired: 09/04/2018 History: Neuropathy Weeks Of Treatment: 5 Clustered Wound: No Photos Photo Uploaded By: Arnette NorrisBiell, Kristina on 12/25/2018 07:49:34 Wound Measurements Length: (cm) 5 Width: (cm) 3.5 Depth: (cm) 0.2 Area: (cm) 13.744 Volume: (cm)  2.749 % Reduction in Area: -297.7% % Reduction in Volume: -165.1% Epithelialization: None Tunneling: No Undermining: No Wound Description Classification: Category/Stage II Wound Margin: Well defined, not attached Exudate Amount: Large Exudate Type: Serosanguineous Exudate Color: red, brown Foul Odor After Cleansing: Yes Due to Product Use: No Slough/Fibrino Yes Wound Bed Granulation Amount: None Present (0%) Exposed Structure Necrotic Amount: Large (67-100%) Fascia Exposed: No Necrotic Quality: Eschar, Adherent Slough Fat Layer (Subcutaneous Tissue) Exposed: Yes Tendon Exposed: No Muscle Exposed: No Joint Exposed: No Bone Exposed: No Periwound Skin Texture Lips, Jeanice L. ([id number]) Texture Color No Abnormalities Noted: No No Abnormalities Noted: No Callus: Yes Atrophie Blanche: No Crepitus: No Cyanosis: No Excoriation: No Ecchymosis: No Induration: No Erythema: Yes Rash: No Erythema Measurement: Marked Scarring: No Hemosiderin Staining: No Mottled: No Moisture Pallor: No No Abnormalities Noted: No Rubor: No Dry / Scaly: Yes Maceration: No Temperature / Pain Temperature: No Abnormality Tenderness on Palpation: Yes Wound Preparation Ulcer Cleansing: Rinsed/Irrigated with Saline Topical Anesthetic Applied: Other: lidocaine 4%, Treatment Notes Wound #6 (Left, Lateral Foot) Notes silvercell, gauze, heel cup Electronic Signature(s) Signed: 12/25/2018 4:38:01 PM By: Arnette NorrisBiell, Kristina Entered By: Arnette NorrisBiell, Kristina on 12/24/2018 09:08:53 Carriker, Katelyn ShellerAMMY L. ([id number]) -------------------------------------------------------------------------------- Vitals Details Patient Name: Katelyn GeorgesWARD, Berna L. Date of Service: 12/24/2018 9:00 AM Medical Record Number: [id number] Patient Account Number: [id number] Date of Birth/Sex: Jan 19, 1961 (57 y.o. F) Treating RN: Arnette NorrisBiell, Kristina Primary Care Jarad Barth: Joen LauraBLISS, [name] Other Clinician: Referring Alyanna Stoermer: Joen LauraBLISS, [name] Treating  Kenneisha Cochrane/Extender: Youlanda RoysKnight, Ikeshia Weeks in Treatment: 5 Vital Signs Time Taken: 09:01 Temperature (F): 99.9 Height (in): 63 Pulse (bpm): 108 Weight (lbs): 246 Respiratory Rate (breaths/min): 18 Body Mass Index (BMI): 43.6 Blood Pressure (mmHg): 112/59 Reference Range: 80 - 120 mg / dl Electronic Signature(s) Signed: 12/24/2018 3:55:25 PM By: Dayton MartesWallace, [name],RRT,CHT, [name], RRT, CHT Entered By: Dayton MartesWallace, [name],RRT,CHT, Katelyn on 12/24/2018 09:03:32

## 2018-12-26 NOTE — Progress Notes (Signed)
ARIELLAH, FAUST ([id number]) Visit Report for 12/24/2018 Chief Complaint Document Details Patient Name: [name], [name] Date of Service: 12/24/2018 9:00 AM Medical Record Number: [id number] Patient Account Number: [id number] Date of Birth/Sex: [birth year] (58 y.o. F) Treating RN: Arnette Norris Primary Care Provider: Joen [name] Other Clinician: Referring Provider: Joen [name] Treating Provider/Extender: Youlanda Roys in Treatment: 5 Information Obtained from: Patient Chief Complaint Patient is here for follow-up evaluation of her right ischial pressure ulcer 11/14/18; patient returns to clinic for review of the wound on the left plantar heel that is been present for 3 months Electronic Signature(s) Signed: 12/24/2018 10:32:40 PM By: Wilkie Aye FNP-C Entered By: Wilkie Aye on 12/24/2018 10:03:35 Rasmusson, [name] ([id number]) -------------------------------------------------------------------------------- HPI Details Patient Name: [name] Date of Service: 12/24/2018 9:00 AM Medical Record Number: [id number] Patient Account Number: [id number] Date of Birth/Sex: [birth year] (57 y.o. F) Treating RN: Arnette Norris Primary Care Provider: Joen [name] Other Clinician: Referring Provider: Joen [name] Treating Provider/Extender: Youlanda Roys in Treatment: 5 History of Present Illness HPI Description: 58 year old patient who was seen by visiting [name] wound care specialist for a wound on both her buttock and was found to have an unstageable wound on the right buttock for about 2 months. I understand that she had a fall and was laying on the floor for about 48 hours before she was found and taken to the ICU and had a long injury to her gluteal area from pressure and also had broken her right humerus. She has had a right proximal humerus fracture and has been followed up with orthopedics recently. The patient has a past medical history of type 2 diabetes mellitus,  paraparesis, acute pyelonephritis, GERD, hypertension, glaucoma, chronic pain, anxiety neurosis, nicotine dependence, COPD. the patient had some debridement done and was to operative was recommended to use Silvadene dressing and offloading. She is a smoker and occasionally smokes a few cigarettes. the patient requested a second opinion for months and is here to discuss her care. 09/21/16; the patient re-presents from home today for review of 3 different wounds. I note that she was seen in the clinic here in July at which time she had bilateral buttock wounds. It was apparently suggested at that time that she use a wound VAC bridged to both wounds just near the initial tuberosity's bilaterally which she refused. The history was a bit difficult to put together. Apparently this patient became ill at the end of April of this year. She was found sitting on the floor she had apparently been for 2 days and subsequently admitted to hospital from 04/23/16 through 05/02/16 and at that point she was critically ill ultimately having sepsis secondary to UTI, nontraumatic rhabdomyolysis and diabetic ketoacidosis. She had acute renal failure and I think required ICU care including intubation. Patient states her wounds actually started at that point on the bilateral issue tuberosities however in reviewing the discharge summary from 5/8 I see no reference to wounds at that point. It did state that she had left lower extremity cellulitis however. Reviewing Epic I see no relevant x-rays. It would appear that her discharge creatinine was within the normal range and indeed on 9/15 her creatinine has remained normal. [facility] There the wounds on her bilateral Buttocks were dressed. Only just before her discharge from the nursing facility she developed an "knot" which was interpreted as cellulitis on the posterior aspect of her left knee she was given antibiotics.  Apparently  sometime late in July a this actually opened and became a wound at home health care was tending to however she is still having purulent drainage coming from this and by my understanding the wound depth is actually become unmeasurable. I am not really clear about what home health has been placing in any of these wound areas. The patient states that is something with silver and it. She is not been systemically unwell no fever or chills her appetite is good. She is a diabetic poorly controlled however she states that her recent blood sugars at home have been in the low to mid 100s. 09/28/16 On evaluation today patient appears to continue to exhibit the 3 areas of ulceration that were noted previous. She did have an x-ray of the right pelvis which showed evidence of potential soft tissue infection but no obvious osteomyelitis. There was a discussion last office visit concerning the possibility of a wound VAC. Witth that being said the x-ray report suggested that an MRI may be more appropriate to further evaluate the extent. Subsequently in regard to the wound over the popliteal portion of the left lower extremity with tunneling at 12:00 the CT scan that was ordered was denied by insurance as they state the patient has not had x-rays prior to advanced imaging. Patient states that she is frustrated with the situation overall. 10/05/16 in the interval since I last saw this patient last week she has had the x-ray of the knee performed. I did review that x-ray today and fortunately shows no evidence of osteomyelitis or other acute abnormality at this point in time. She continues to have the opening iin the posterior left popliteal space with tracking proximal up the posterior thigh. Nothing seems to have worsened but it also seems to have not improved. The same is true in regard to the right pressure ulcer over the gluteal region which extends toward the ischium. The left gluteal pressure ulcer  actually appears to be doing somewhat better my opinion there is some necrotic slough but overall this appears fairly well. She tells me thatt she has some discomfort especially when home health is helping her with dressing changes as they do not know her. At worse she rates her pain to be a 5 out of 10 right now it's more like a 1 out of 10. Kesecker, Chemika L. ([id number]) 10/07/16; still the patient has 3 different wound areas. She has a deep stage IV wound over her right ischial tuberosity. She is due to have an MRI next week. The wound over her left ischial tuberosity is more superficial and underwent debridement today. Finally she has a small open area in her left popliteal fossa the probes on measurably forward superiorly. Still a lot of drainage coming out of this. The CT scan that I ordered 3 weeks ago was questioned by her insurance company wanting a plain x-ray first. As I understand things result of this is nothing has been done in 3 weeks in terms of imaging the thigh and she has an MRI booked of this along with her pelvis for next week line 10/12/16; patient has a deep probing wound over the right ischiall tuberosity, stage III wound over the left visual tuberosity and a draining sinus in her left popliteal fossa. None of this much different from when I saw this 3 weeks ago. We have been using silver rope to the right ischial wound and a draining area in the left popliteal fossa. Plain silver alginate to the area  on the left ischial tuberosity 10/19/16; the patient's wounds are essentially unchanged although the area on the left lower gluteal is actually improved. Our intake nurse noted drainage from the right initial tuberosity probing wound as well as the draining area in the left popliteal fossa. Both of these were cultured. She had x-rays I think at the insistence of her insurance company on 09/23/16 x-ray of the pelvis was not particularly helpful she did have soft tissue air over the  right lower pelvis although with the depth of this wound this is not surprising. An x-ray of her left knee did not show any specific abnormalities. We are still using silver alginate to these wound areas. Her MRI is booked for 10/27 10/26/16; cultures of the purulent drainage in her right initial tuberosity wound grew moderate Proteus and few staph aureus. The same organisms were cultured out of the left knee sinus tract posteriorly. The staph aureus is MRSA. I had started her on Augmentin last week I added doxycycline. The MRI of the left lower extremity and pelvis was finally done. The MRI of the femur showed subcutaneous soft tissue swelling edema fluid and myositis in the vastus lateralis muscle but no soft tissue abscess septic arthritis or osteomyelitis. MRI of the pelvis showed the left wound to be more expensive extending down to the bone there was osteomyelitis. Left hamstring tendons were also involved. No septic arthritis involving the hip. The decubitus ulcer on the right side showed no definite osteomyelitis or abscess.. The right hip wound is actually the one the probes 6 cm downward. But the MRI showing infection including osteomyelitis on the left explains the draining sinus in the popliteal fossa on the left. She did have antibiotics in the hospitalization last time and this extended into her nursing home stay but I'm not exactly sure what antibiotics and for what duration. According the patient this did include vancomycin with considerable effort of our staff we are able to get the patient into see Dr. Sampson Goon today. There were transportation difficulties. Her mother had open heart surgery and is in the ICU in [place] therefore her brother was unable to transport. Dr. Jarrett Ables office graciously arranged time to see her today. From my point of view she is going to require IV vancomycin plus perhaps a third generation cephalosporin. I plan to keep her on doxycycline and  Augmentin until the IV antibiotics can be arranged. 11/02/2016 - Dajiah presents today for management of ulcers; She saw Dr. 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 She is to have the PICC line placed today and receive the initial dose for both antibiotics today. She has yet to receive the offloading chair cushion and/or mattress overlay from home health, apparently this has been a 3 week process. I encouraged her to speak to home health regarding this matter, along with offering home health to contact the wouns care center with any questions or concerns. The left ischial pressure ulcer continues to imporve, while to right ischial ulcer has increased in depth. The popliteal fossa sinus tract remains unmeasurable due to the limitation of depth measurement (tract extends beyond our measuring devices). 11-16-2016 Ms. Tengan presents today for evaluation and management of bilateral ischial stage IV pressure ulcers and sinus tract to the left popliteal fossa. she is under the care of Dr. Sampson Goon for IV antibiotic therapy; she states that the vancomycin was placed on hold and will be restarted at a  lower dose based on her renal function. She continues taking Zosyn in addition to the vancomycin. She also states that she has yet to receive offloading cushions from home health, according to her she does not qualify for these offloading cushions because "the ulcers are unstageable ". We will contact the home health agency today to lend clarity regarding her pressure ulcers. The left popliteal fossa sinus tract continues to be a measurable as it extends beyond the length of our measuring devices.. 11/30/16; the patient is still on vancomycin and Zosyn. The depth of the draining sinus behind her left popliteal fossa is down to 4 cm although there is still serosanguineous drainage coming out of this. She saw Dr.  Sampson Goon of infectious disease yesterday the idea is to weeks more of IV antibiotics and then oral antibiotics although I have not read his note. The area on the left gluteal fold is just about healed. She has a 6 cm draining sinus over the right initial tuberosity although I cannot feel bone at the base of this. As far as the patient is aware she has not had a recheck of her inflammatory markers. 12/07/16; patient is on vancomycin and Zosyn appointment with Dr. Sampson Goon on the 19th at which point the patient expects to have a change in antibiotics. Remarkable improvement over the wound over the left ischial tuberosity which is just about closed. The draining sinus in her popliteal fossa has 0.4 cm in depth. The area on the right ischial tuberosity still probes down 7 cm. This is closed and overall wound dimensions but not depth. 12/14/16; patient is completing her vancomycin and Zosyn and per her she is going to transition to Bactrim and Augmentin for another 3 weeks. The area on her left gluteal fold is closed except for some skin tears. The area behind her left knee is no longer has any depth. The only remaining area that is of clinical concern is on the right gluteal fold probing towards the right Cerrone, Mariabella L. ([id number]) ischial tuberosity. Today this measures 6.9 cm in depth. Very gritty surface 12/21/16; patient is now on Bactrim and Augmentin as directed by infectious disease. This should be for another 2 weeks. The area in her left gluteal fold and left popliteal are closed over and fully healed. Measurements today at 7 cm in the right buttock wound is unchanged from last week. 12/27/16; patient is on Bactrim and Augmentin for another week as directed by infectious disease. She has completed her IV antibiotics. She is not been systemically unwell no fever no chills. The area on her right buttock measured over 6 cm in depth. There is no palpable bone. No evidence of surrounding soft  tissue infection. She is complaining of tongue irritation and has a history of thrush 01/04/17; patient is been back to see Dr. Sampson Goon, her Augmentin was stopped but he continued the Bactrim for another 3 weeks. Depth of the wound is 6.7 cm there is been no major change in either direction. She is not receive the wound VAC from home health I think because of confusion about who is supposed to provided will actually talk to the home health agency today [[facility] The net no major change. 01/18/17; patient obtained her wound VAC about 10 days ago however for some reason it was not actually put on the wound. She is therefore here for [place] to apply this I guess. No other issues are noted. She is not complaining of pain fever drainage 02/01/17; patient is here  now having the wound VAC or 3-4 weeks to a deep pressure area over the right initial tuberosity. This measures 6 cm in depth today which is about half a centimeter better than 2 weeks ago. There is no evidence she is systemically unwell no fever no chills no pain around the area. 02/15/17; I follow this patient every 2 weeks for a deep area over the right ischial tuberosity. This measures 5.5 cm today which is a continued improvement of 1.2 cm from 1/10 and down 0.5 cm from her visit 2 weeks ago 03/01/17; continued difficult area over the right initial tuberosity using the wound VAC. Depth today of 5.1 cm which is improved. Does not appear to be a lot of drainage in the canister. Antibiotics were finally stopped by Dr. Sampson GoonFitzgerald bactrim[]  and inflammatory markers have been repeated 03/15/17; fall this lady every 2 weeks for a difficult area over her lower right gluteal area/ischial tuberosity. Depth today at 4.6 cm. This is a slow but steady improvement in the depth of this wound. Although we have labeled this as a pressure ulcer there may have been an underlying infection here at one point before we saw her. She had osteomyelitis on the  left extensively which is since resolved 03/29/17- patient is here for follow-up evaluation of her right ischial pressure ulcer. She continues with the wound VAC and home health. According to the nurse home health has been using less foams and appropriate and we will instruct accordingly. The patient continues to smoke, approximately 10 cigarettes a day. She has been advised to decrease that in half by her next appointment, with a goal of complete cessation. She states her blood sugars have been consistently less than 150. She states that she spends most of her day position left lateral or prone. She does have an air mattress on her bed, she does not have an mattress for her chair, she states she cannot afford this. 04/12/17; patient is here for evaluation of her right ischial pressure ulcer. We continue to use a wound VAC with minimal improvement today the depth of this measuring 4.4 cm versus 4.6 cm 2 weeks ago. We are using a [employer] wound VAC on this area. There is not excessive drainage no pain. The patient tells me she tries to keep off this in bed but is up in the wheelchair for 2 hours a day. She is limited in no her overall ambulation but is improving and apparently is getting bilateral lower extremity braces which she hopes will improve her ability to walk independently. 05/10/17; Depth at 3.3cm. Improved 05/24/17; depth at 3.5 cm. This is not improved since last time. Not clear if they are using collagen under the foam 06/07/17; depth that 2.9 which is a slight improvement. Still using the wound VAC. 06/21/17; depth is 2.9 cm which is exactly the same as last time. Also the appearance of this wound is completely the same. We have been using silver collagen under a wound VAC. 07/05/17 patient presents today for reevaluation concerning her right Ischial wound. She had switched insurance companies and so it does appear that the Wound VAC needs to be reauthorized which we are working on this morning.  Nonetheless her wound has been saying about the same we have continued to use the silver collagen underneath the wound VAC. She has no discomfort. 07/19/17; patient follows every 2 weeks for her right if she'll wound. Since I last saw this a month ago her dimensions of come down to 2.6  cm. This is slightly down from a month ago when it was 2.9 cm in 4 months ago it was 4.6 cm. I note that there were insurance company issues with regard to the wound VAC which she apparently is not having on for several weeks now. I think it would be reasonable to change therapies here. Will use silver alginate rope 08/02/17 on evaluation today patient's wound actually appears to be doing significantly better in regard to the depth as compared to her previous evaluation. She is having no discomfort. Unfortunately she never got the Wound VAC reapproved following the insurance issues from several weeks ago. With that being said it does not appear that she needs to requires the Wound VAC anymore and in fact I feel that she is doing better and making greater improvements at this point without it. Fortunately she has no nausea, vomiting, diarrhea, fevers, or chills. 08/16/17; patient's wound depth down to 2.3 cm. She is using silver alginate packing rope. Note that her wound VAC was discontinued due to insurance issues. This is not particularly surprising. She has not been systemically unwell and has no other new complaints 08/30/17; no change in depth. Still at 2.3 cm. Still with the same gritty surface requiring debridement. I've been using silver Wrobleski, Kinnedy L. ([id number]) alginate for quite a period of time although this came down nicely in the last month 10/04/17; the depth of this is 2 cm however with careful inspection under high-intensity light most of the walls of this small probing sinus seemed to be normally epithelialized. I cannot exactly see the base of this however there is been no drainage. We've been using  silver alginate there is no drainage on the dressing when it is removed. 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 There is no evidence of surrounding infection or pain. The patient had many review weakness in her bilateral legs. She is already been to see a neurologist who according the patient told her "you would never walk again" 11/01/17; this is a patient I last saw a month ago. At that point the linear tunnel in her right buttock was 2 cm. It was not possible to see the depth of this as skin had grown into the tunnel. In light of the fact that there was no drainage and no visible wound I recommended that we just allow her to go about her usual activity without dressing. She reports that almost immediately she noted drainage on her clothing although in spite of her instructions the contrary she did not come back to the clinic. She has not been specifically addressing this and is continued to no drainage without other symptoms either local or systemic 11/29/17; I follow this woman monthly. Last time the wound on her right buttock was 1.8 cm today measuring at 1.4 there has been gradual improvement in this. Concerning is the fact that the patient says that Dr. Sampson Goon of infectious disease changed her from doxycycline to Augmentin apparently because of the elevated inflammatory markers. She also said he did a culture of this area but she is not heard the results. I don't think I have his information available on care everywhere however I will check this. She is using Hydrofera Blue rope. The patient and her intake nurse report that she is still having identifiable drainage which certainly makes it clear that this is not closed. 01/02/17 on evaluation today patient appears to be doing fairly well in regard to her left gluteal wound. She  has been continuing with the [facility] Dressing's here in our office. We actually see her once a month and then  subsequently are performing weekly dressing changes with Hydrofera Blue Dressing rope during nurse visits one time a week. Today there really was not much drainage at this point. However this has happened previously where she had no drainage and was essentially thought to be close internally although the external had not completely pulled together. However then she reopened and began draining again. He has been mentioned the possibility of her seeing plastic surgery to try to get this finally and completely healed. 01/18/18 she is here in follow-up evaluation for right ischial pressure ulcer. She has an appointment with [facility] plastic surgery on 2/8 for evaluation. She continues to have minimal drainage at the tip of the dressing product. She continues to smoke, admits to 8-10 cigarettes per day. No change in treatment plan 02/06/18 on evaluation today patient appears to be doing about the same in regard to her right Ischial wound. She has been tolerating the dressing changes without complication. She did have her appointment at [facility] plastic surgery she was not really impressed. They stated that due to her weight they were unable to perform a flap and subsequently recommended the only thing they could attempt would be to exercise around the ulcer and then attempt to suture it together. The question is whether this would be effective or not. Patient really is not wanting to proceed down that road. Nonetheless she really have not changed much in regard to her wound measurements. 03/06/18 on evaluation today patient states that last week when I perform the silver nitrate that she actually had some burning pain for about two days following. Since that time the area has been very dry and she states that it is also been very dark and scabbed around the area. With that being said other than this she feels that the hope was the fact that it was burning was a good sign and that they would be improvement but the  really does not appear to be significant improvement at this point. We have tried a lot of different dressings for her most recently Hydrofera Blue Dressing, silver alginate rope, and collagen. We have never attempted Endoform which I think could be a possibility for her. 03/22/18-She is here for a follow-up evaluation of her right ischial pressure ulcer. There is no drainage on her dressings and what appears to be epidermal debris in the ulcer; she has no pain. The ulcer was cleanse with betadine, I believe it is healed, despite remaining a  1" depth. We will place a dry dressing over and evaluate next week for drainage, if no drainage she will be discharged at that time. 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 She underwent a prolonged course of IV antibiotics as directed by Dr. Sampson Goon. The tunneling right ischial pressure ulcer as eventually closed over. We've been following this over the last month or so with no identifiable area that was open. We've been using Endoform and hydrogel. She rising clinic today, our intake nurse reported some dampness. READMISSION 11/14/18 This patient is a 57 year old woman who we've previously had in clinic for a very long period of time finally culminating in April 2019. At that point she  had bilateral pressure ulcers over her ischial tuberosities complicated by underlying osteomyelitis and Bhat, Jayme L. ([id number]) a large tracking area that gave her wound in the left popliteal fossa. She required a prolonged course of IV antibiotics and wound care in the last wound on her right ischial tuberosity was closed out in April/19. She claims that this is still closed. We did not review this. The patient is a type II diabetic on insulin. She  states that 3 months ago she noted loose skin on her heel which she pulled off. She then had a deeper area that she filed down with an instrument. She's been left with a wound on the left heel which she says is actually better and less deep. More recently she's been using peroxide and leaving it open to air. 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 We were not able to obtain ABIs in this clinic 12/05/18; it's been 3 weeks since I saw this patient. Since then she went for her noninvasive arterial studies. On the left the patient had an SFA that was occluded at its origin with long segment chronic occlusion into the thigh. The distal segment of the SFA is open monophasic waveforms. There is some flow in the profunda femoral artery. The popliteal artery is monophasic anterior tibial and posterior tibials demonstrate low velocities in monophasic waveforms. The patient is a known diabetic. She is going for angiogram on 12/30. The wound is really deteriorated somewhat. There is black eschar. The wound is deeper. She has some erythema around the wound spreading above the lateral heel. There is also a small cyst that I don't remember seeing. This was not obviously infectious and I didn't open this today. She is going to need antibiotics she tells me she has doxycycline at home which is reasonable. 12/12/2018; she arrives in clinic this week with a report from her home health nurse that things were not looking very good. Indeed there is nonviable tissue. The eschar is not quite as adherent but there is extension of the wound superiorly some surrounding erythema as well. She has been on doxycycline for a week may be somewhat longer. She has been using Santyl She is not systemically unwell in terms of fever although she tells me that since Thanksgiving  she has been having postprandial nausea and vomiting and is not eating very well. There is no hematemesis and no clear complaints of abdominal pain. She states that she vomits roughly 2 hours after particles of undigested food. She also states she has lost 30 pounds over the course of this year 12/24/18 Seen today for follow-up and management of left heel wound. The wound still has black eschar with surrounding erythema and adherent slough above the lateral heel. She is running a low-grade fever today. She denies feeling bad recently or having any flulike symptoms,Nausea, or vomiting. She did state since starting the antibiotic she has had a decrease in her appetite.. Recently finished a 1 month course of doxycycline. She stopped taking it on Saturday 12/22/18. Encouraged her to continue prescribe antibiotic for another week. She would benefit today from sharp debridement however due to current low- grade fever will not debride today. She is scheduled to follow back up with vascular specialist when January 09, 2019. Electronic Signature(s) Signed: 12/24/2018 10:32:40 PM By: Wilkie Aye FNP-C Entered By: Wilkie Aye on 12/24/2018 09:39:57 Fuerte, Elza L. ([id number]) -------------------------------------------------------------------------------- Physical Exam Details  Patient Name: AMARIEA, ZICH Date of Service: 12/24/2018 9:00 AM Medical Record Number: [id number] Patient Account Number: [id number] Date of Birth/Sex: [birth year] (58 y.o. F) Treating RN: Arnette Norris Primary Care Provider: Joen [name] Other Clinician: Referring Provider: Joen [name] Treating Provider/Extender: Youlanda Roys in Treatment: 5 Constitutional appears in no distress. Respiratory Respiratory effort is easy and symmetric bilaterally. Rate is normal at rest and on room air.. Cardiovascular Extremities are free of edema.. Integumentary (Hair, Skin) open wound left heel. Notes Wound of lateral  aspect of the left heel. Thick adherent eschar and slough. No debridement at this time due to vascular occlusion and low grade fever. Surrounding area of wound with maceration and nonviable tissue. Lightly removed some of the tissue with saline and gauze. There was no purulent drainage. Reviewed recent culture results with Ms. Nicholson during visit. Will continue current wound tx plan and doxycycline for an additional week. Electronic Signature(s) Signed: 12/24/2018 10:32:40 PM By: Wilkie Aye FNP-C Entered By: Wilkie Aye on 12/24/2018 09:57:53 Campi, [name] ([id number]) -------------------------------------------------------------------------------- Physician Orders Details Patient Name: [name] Date of Service: 12/24/2018 9:00 AM Medical Record Number: [id number] Patient Account Number: [id number] Date of Birth/Sex: 1961-10-10 (57 y.o. F) Treating RN: Arnette Norris Primary Care Provider: Joen [name] Other Clinician: Referring Provider: Joen [name] Treating Provider/Extender: Youlanda Roys in Treatment: 5 Verbal / Phone Orders: No Diagnosis Coding Wound Cleansing Wound #6 Left,Lateral Foot o Clean wound with Normal Saline. Anesthetic (add to Medication List) Wound #6 Left,Lateral Foot o Topical Lidocaine 4% cream applied to wound bed prior to debridement (In Clinic Only). Primary Wound Dressing Wound #6 Left,Lateral Foot o Silver Alginate Secondary Dressing Wound #6 Left,Lateral Foot o Conform/Kerlix - Heel cup Dressing Change Frequency Wound #6 Left,Lateral Foot o Change Dressing Monday, Wednesday, Friday [facility] Follow-up Appointments Wound #6 Left,Lateral Foot o Return Appointment in 1 week. Edema Control Wound #6 Left,Lateral Foot o Elevate legs to the level of the heart and pump ankles as often as possible Off-Loading Wound #6 Left,Lateral Foot o Other: - Keep pressure off of left heel Additional Orders / Instructions Wound  #6 Left,Lateral Foot o Stop Smoking Home Health Wound #6 Left,Lateral Foot o Continue Home Health Visits - [facility] o Home Health Nurse may visit PRN to address patientos wound care needs. NINAH, MANZUETA ([id number]) o FACE TO FACE ENCOUNTER: MEDICARE and MEDICAID PATIENTS: I certify that this patient is under my care and that I had a face-to-face encounter that meets the physician face-to-face encounter requirements with this patient on this date. The encounter with the patient was in whole or in part for the following MEDICAL CONDITION: (primary reason for Home Healthcare) MEDICAL NECESSITY: I certify, that based on my findings, NURSING services are a medically necessary home health service. HOME BOUND STATUS: I certify that my clinical findings support that this patient is homebound (i.e., Due to illness or injury, pt requires aid of supportive devices such as crutches, cane, wheelchairs, walkers, the use of special transportation or the assistance of another person to leave their place of residence. There is a normal inability to leave the home and doing so requires considerable and taxing effort. Other absences are for medical reasons / religious services and are infrequent or of short duration when for other reasons). o If current dressing causes regression in wound condition, may D/C ordered dressing product/s and apply Normal Saline Moist Dressing daily until next Wound Healing Center / Other MD appointment. Notify Wound Healing Center of regression  in wound condition at [phone]. o Please direct any NON-WOUND related issues/requests for orders to patient's Primary Care Physician Electronic Signature(s) Signed: 12/24/2018 10:32:40 PM By: Wilkie Aye FNP-C Entered By: Wilkie Aye on 12/24/2018 10:04:28 Narain, [name] ([id number]) -------------------------------------------------------------------------------- Problem List Details Patient Name: [name] Date of Service: 12/24/2018 9:00 AM Medical Record Number: [id number] Patient Account Number: [id number] Date of Birth/Sex: [birth year] (57 y.o. 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 11/14/2018 No Yes layer exposed E11.42 Type 2 diabetes mellitus with diabetic polyneuropathy 11/14/2018 No Yes E11.51 Type 2 diabetes mellitus with diabetic peripheral angiopathy 11/14/2018 No Yes without gangrene Inactive Problems Resolved Problems Electronic Signature(s) Signed: 12/24/2018 10:32:40 PM By: Wilkie Aye FNP-C Entered By: Wilkie Aye on 12/24/2018 10:04:02 Bunnell, Santrice Elbert Ewings ([id number]) -------------------------------------------------------------------------------- Progress Note Details Patient Name: [name] Date of Service: 12/24/2018 9:00 AM Medical Record Number: [id number] Patient Account Number: [id number] Date of Birth/Sex: [birth year] (57 y.o. F) Treating RN: Arnette Norris Primary Care Provider: Joen [name] Other Clinician: Referring Provider: Joen [name] Treating Provider/Extender: Youlanda Roys in Treatment: 5 Subjective Chief Complaint Information obtained from Patient Patient is here for follow-up evaluation of her right ischial pressure ulcer 11/14/18; patient returns to clinic for review of the wound on the left plantar heel that is been present for 3 months History of Present Illness (HPI) 58 year old patient who was seen by visiting [name] wound care specialist for a wound on both her buttock and was found to have an unstageable wound on the right buttock for about 2 months. I  understand that she had a fall and was laying on the floor for about 48 hours before she was found and taken to the ICU and had a long injury to her gluteal area from pressure and also had broken her right humerus. She has had a right proximal humerus fracture and has been followed up with orthopedics recently. The patient has a past medical history of type 2 diabetes mellitus, paraparesis, acute pyelonephritis, GERD, hypertension, glaucoma, chronic pain, anxiety neurosis, nicotine dependence, COPD. the patient had some debridement done and was to operative was recommended to use Silvadene dressing and offloading. She is a smoker and occasionally smokes a few cigarettes. the patient requested a second opinion for months and is here to discuss her care. 09/21/16; the patient re-presents from home today for review of 3 different wounds. I note that she was seen in the clinic here in July at which time she had bilateral buttock wounds. It was apparently suggested at that time that she use a wound VAC bridged to both wounds just near the initial tuberosity's bilaterally which she refused. The history was a bit difficult to put together. Apparently this patient became ill at the end of April of this year. She was found sitting on the floor she had apparently been for 2 days and subsequently admitted to hospital from 04/23/16 through 05/02/16 and at that point she was critically ill ultimately having sepsis secondary to UTI, nontraumatic rhabdomyolysis and diabetic ketoacidosis. She had acute renal failure and I think required ICU care including intubation. Patient states her wounds actually started at that point on the bilateral issue tuberosities however in reviewing the discharge summary  from 5/8 I see no reference to wounds at that point. It did state that she had left lower extremity cellulitis however. Reviewing Epic I see no relevant x-rays. It would appear that her discharge creatinine was within the  normal range and indeed on 9/15 her creatinine has remained normal. [facility] There the wounds on her bilateral Buttocks were dressed. Only just before her discharge from the nursing facility she developed an "knot" which was interpreted as cellulitis on the posterior aspect of her left knee she was given antibiotics. Apparently sometime late in July a this actually opened and became a wound at home health care was tending to however she is still having purulent drainage coming from this and by my understanding the wound depth is actually become unmeasurable. I am not really clear about what home health has been placing in any of these wound areas. The patient states that is something with silver and it. She is not been systemically unwell no fever or chills her appetite is good. She is a diabetic poorly controlled however she states that her recent blood sugars at home have been in the low to mid 100s. 09/28/16 On evaluation today patient appears to continue to exhibit the 3 areas of ulceration that were noted previous. She did have an x-ray of the right pelvis which showed evidence of potential soft tissue infection but no obvious osteomyelitis. There was a discussion last office visit concerning the possibility of a wound VAC. Witth that being said the x-ray report suggested that an MRI may be more appropriate to further evaluate the extent. Subsequently in regard to the wound over the popliteal portion of the left lower extremity with tunneling at 12:00 the CT scan that was ordered was denied by insurance as they state the patient has not had x-rays prior to advanced imaging. Patient states that she is frustrated with the situation overall. 10/05/16 in the interval since I last saw this patient last week she has had the x-ray of the knee performed. I did review that x-ray today and fortunately shows no evidence of osteomyelitis or other  acute abnormality at this point in time. She continues Quiles, Jae L. ([id number]) to have the opening iin the posterior left popliteal space with tracking proximal up the posterior thigh. Nothing seems to have worsened but it also seems to have not improved. The same is true in regard to the right pressure ulcer over the gluteal region which extends toward the ischium. The left gluteal pressure ulcer actually appears to be doing somewhat better my opinion there is some necrotic slough but overall this appears fairly well. She tells me thatt she has some discomfort especially when home health is helping her with dressing changes as they do not know her. At worse she rates her pain to be a 5 out of 10 right now it's more like a 1 out of 10. 10/07/16; still the patient has 3 different wound areas. She has a deep stage IV wound over her right ischial tuberosity. She is due to have an MRI next week. The wound over her left ischial tuberosity is more superficial and underwent debridement today. Finally she has a small open area in her left popliteal fossa the probes on measurably forward superiorly. Still a lot of drainage coming out of this. The CT scan that I ordered 3 weeks ago was questioned by her insurance company wanting a plain x-ray first. As I understand things  result of this is nothing has been done in 3 weeks in terms of imaging the thigh and she has an MRI booked of this along with her pelvis for next week line 10/12/16; patient has a deep probing wound over the right ischiall tuberosity, stage III wound over the left visual tuberosity and a draining sinus in her left popliteal fossa. None of this much different from when I saw this 3 weeks ago. We have been using silver rope to the right ischial wound and a draining area in the left popliteal fossa. Plain silver alginate to the area on the left ischial tuberosity 10/19/16; the patient's wounds are essentially unchanged although the area on  the left lower gluteal is actually improved. Our intake nurse noted drainage from the right initial tuberosity probing wound as well as the draining area in the left popliteal fossa. Both of these were cultured. She had x-rays I think at the insistence of her insurance company on 09/23/16 x-ray of the pelvis was not particularly helpful she did have soft tissue air over the right lower pelvis although with the depth of this wound this is not surprising. An x-ray of her left knee did not show any specific abnormalities. We are still using silver alginate to these wound areas. Her MRI is booked for 10/27 10/26/16; cultures of the purulent drainage in her right initial tuberosity wound grew moderate Proteus and few staph aureus. The same organisms were cultured out of the left knee sinus tract posteriorly. The staph aureus is MRSA. I had started her on Augmentin last week I added doxycycline. The MRI of the left lower extremity and pelvis was finally done. The MRI of the femur showed subcutaneous soft tissue swelling edema fluid and myositis in the vastus lateralis muscle but no soft tissue abscess septic arthritis or osteomyelitis. MRI of the pelvis showed the left wound to be more expensive extending down to the bone there was osteomyelitis. Left hamstring tendons were also involved. No septic arthritis involving the hip. The decubitus ulcer on the right side showed no definite osteomyelitis or abscess.. The right hip wound is actually the one the probes 6 cm downward. But the MRI showing infection including osteomyelitis on the left explains the draining sinus in the popliteal fossa on the left. She did have antibiotics in the hospitalization last time and this extended into her nursing home stay but I'm not exactly sure what antibiotics and for what duration. According the patient this did include vancomycin with considerable effort of our staff we are able to get the patient into see Dr. Sampson Goon  today. There were transportation difficulties. Her mother had open heart surgery and is in the ICU in [place] therefore her brother was unable to transport. Dr. Jarrett Ables office graciously arranged time to see her today. From my point of view she is going to require IV vancomycin plus perhaps a third generation cephalosporin. I plan to keep her on doxycycline and Augmentin until the IV antibiotics can be arranged. 11/02/2016 - Tama presents today for management of ulcers; She saw Dr. 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 She is to have the PICC line placed today and receive the initial dose for both antibiotics today. She has yet to receive the offloading chair cushion and/or mattress overlay from home health, apparently this has been a 3 week process. I encouraged her to speak to home health regarding this matter, along with offering  home health to contact the wouns care center with any questions or concerns. The left ischial pressure ulcer continues to imporve, while to right ischial ulcer has increased in depth. The popliteal fossa sinus tract remains unmeasurable due to the limitation of depth measurement (tract extends beyond our measuring devices). 11-16-2016 Ms. Hodgkin presents today for evaluation and management of bilateral ischial stage IV pressure ulcers and sinus tract to the left popliteal fossa. she is under the care of Dr. Sampson Goon for IV antibiotic therapy; she states that the vancomycin was placed on hold and will be restarted at a lower dose based on her renal function. She continues taking Zosyn in addition to the vancomycin. She also states that she has yet to receive offloading cushions from home health, according to her she does not qualify for these offloading cushions because "the ulcers are unstageable ". We will contact the home health agency today to lend clarity  regarding her pressure ulcers. The left popliteal fossa sinus tract continues to be a measurable as it extends beyond the length of our measuring devices.. 11/30/16; the patient is still on vancomycin and Zosyn. The depth of the draining sinus behind her left popliteal fossa is down to 4 cm although there is still serosanguineous drainage coming out of this. She saw Dr. Sampson Goon of infectious disease yesterday the idea is to weeks more of IV antibiotics and then oral antibiotics although I have not read his note. The area on the left gluteal fold is just about healed. She has a 6 cm draining sinus over the right initial tuberosity although I cannot feel bone at the base of this. As far as the patient is aware she has not had a recheck of her inflammatory markers. SADHANA, FRATER ([id number]) 12/07/16; patient is on vancomycin and Zosyn appointment with Dr. Sampson Goon on the 19th at which point the patient expects to have a change in antibiotics. Remarkable improvement over the wound over the left ischial tuberosity which is just about closed. The draining sinus in her popliteal fossa has 0.4 cm in depth. The area on the right ischial tuberosity still probes down 7 cm. This is closed and overall wound dimensions but not depth. 12/14/16; patient is completing her vancomycin and Zosyn and per her she is going to transition to Bactrim and Augmentin for another 3 weeks. The area on her left gluteal fold is closed except for some skin tears. The area behind her left knee is no longer has any depth. The only remaining area that is of clinical concern is on the right gluteal fold probing towards the right ischial tuberosity. Today this measures 6.9 cm in depth. Very gritty surface 12/21/16; patient is now on Bactrim and Augmentin as directed by infectious disease. This should be for another 2 weeks. The area in her left gluteal fold and left popliteal are closed over and fully healed. Measurements today at 7  cm in the right buttock wound is unchanged from last week. 12/27/16; patient is on Bactrim and Augmentin for another week as directed by infectious disease. She has completed her IV antibiotics. She is not been systemically unwell no fever no chills. The area on her right buttock measured over 6 cm in depth. There is no palpable bone. No evidence of surrounding soft tissue infection. She is complaining of tongue irritation and has a history of thrush 01/04/17; patient is been back to see Dr. Sampson Goon, her Augmentin was stopped but he continued the Bactrim for another 3 weeks. Depth  of the wound is 6.7 cm there is been no major change in either direction. She is not receive the wound VAC from home health I think because of confusion about who is supposed to provided will actually talk to the home health agency today [[facility] The net no major change. 01/18/17; patient obtained her wound VAC about 10 days ago however for some reason it was not actually put on the wound. She is therefore here for [place] to apply this I guess. No other issues are noted. She is not complaining of pain fever drainage 02/01/17; patient is here now having the wound VAC or 3-4 weeks to a deep pressure area over the right initial tuberosity. This measures 6 cm in depth today which is about half a centimeter better than 2 weeks ago. There is no evidence she is systemically unwell no fever no chills no pain around the area. 02/15/17; I follow this patient every 2 weeks for a deep area over the right ischial tuberosity. This measures 5.5 cm today which is a continued improvement of 1.2 cm from 1/10 and down 0.5 cm from her visit 2 weeks ago 03/01/17; continued difficult area over the right initial tuberosity using the wound VAC. Depth today of 5.1 cm which is improved. Does not appear to be a lot of drainage in the canister. Antibiotics were finally stopped by Dr. Sampson Goon bactrim[]  and inflammatory markers have been  repeated 03/15/17; fall this lady every 2 weeks for a difficult area over her lower right gluteal area/ischial tuberosity. Depth today at 4.6 cm. This is a slow but steady improvement in the depth of this wound. Although we have labeled this as a pressure ulcer there may have been an underlying infection here at one point before we saw her. She had osteomyelitis on the left extensively which is since resolved 03/29/17- patient is here for follow-up evaluation of her right ischial pressure ulcer. She continues with the wound VAC and home health. According to the nurse home health has been using less foams and appropriate and we will instruct accordingly. The patient continues to smoke, approximately 10 cigarettes a day. She has been advised to decrease that in half by her next appointment, with a goal of complete cessation. She states her blood sugars have been consistently less than 150. She states that she spends most of her day position left lateral or prone. She does have an air mattress on her bed, she does not have an mattress for her chair, she states she cannot afford this. 04/12/17; patient is here for evaluation of her right ischial pressure ulcer. We continue to use a wound VAC with minimal improvement today the depth of this measuring 4.4 cm versus 4.6 cm 2 weeks ago. We are using a KCI wound VAC on this area. There is not excessive drainage no pain. The patient tells me she tries to keep off this in bed but is up in the wheelchair for 2 hours a day. She is limited in no her overall ambulation but is improving and apparently is getting bilateral lower extremity braces which she hopes will improve her ability to walk independently. 05/10/17; Depth at 3.3cm. Improved 05/24/17; depth at 3.5 cm. This is not improved since last time. Not clear if they are using collagen under the foam 06/07/17; depth that 2.9 which is a slight improvement. Still using the wound VAC. 06/21/17; depth is 2.9 cm which  is exactly the same as last time. Also the appearance of this wound is  completely the same. We have been using silver collagen under a wound VAC. 07/05/17 patient presents today for reevaluation concerning her right Ischial wound. She had switched insurance companies and so it does appear that the Wound VAC needs to be reauthorized which we are working on this morning. Nonetheless her wound has been saying about the same we have continued to use the silver collagen underneath the wound VAC. She has no discomfort. 07/19/17; patient follows every 2 weeks for her right if she'll wound. Since I last saw this a month ago her dimensions of come down to 2.6 cm. This is slightly down from a month ago when it was 2.9 cm in 4 months ago it was 4.6 cm. I note that there were insurance company issues with regard to the wound VAC which she apparently is not having on for several weeks now. I think it would be reasonable to change therapies here. Will use silver alginate rope 08/02/17 on evaluation today patient's wound actually appears to be doing significantly better in regard to the depth as compared to her previous evaluation. She is having no discomfort. Unfortunately she never got the Wound VAC reapproved Doepke, Calah L. ([id number]) following the insurance issues from several weeks ago. With that being said it does not appear that she needs to requires the Wound VAC anymore and in fact I feel that she is doing better and making greater improvements at this point without it. Fortunately she has no nausea, vomiting, diarrhea, fevers, or chills. 08/16/17; patient's wound depth down to 2.3 cm. She is using silver alginate packing rope. Note that her wound VAC was discontinued due to insurance issues. This is not particularly surprising. She has not been systemically unwell and has no other new complaints 08/30/17; no change in depth. Still at 2.3 cm. Still with the same gritty surface requiring debridement. I've been  using silver alginate for quite a period of time although this came down nicely in the last month 10/04/17; the depth of this is 2 cm however with careful inspection under high-intensity light most of the walls of this small probing sinus seemed to be normally epithelialized. I cannot exactly see the base of this however there is been no drainage. We've been using silver alginate there is no drainage on the dressing when it is removed. 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 There is no evidence of surrounding infection or pain. The patient had many review weakness in her bilateral legs. She is already been to see a neurologist who according the patient told her "you would never walk again" 11/01/17; this is a patient I last saw a month ago. At that point the linear tunnel in her right buttock was 2 cm. It was not possible to see the depth of this as skin had grown into the tunnel. In light of the fact that there was no drainage and no visible wound I recommended that we just allow her to go about her usual activity without dressing. She reports that almost immediately she noted drainage on her clothing although in spite of her instructions the contrary she did not come back to the clinic. She has not been specifically addressing this and is continued to no drainage without other symptoms either local or systemic 11/29/17; I follow this woman monthly. Last time the wound on her right buttock was 1.8 cm today measuring at 1.4 there has been gradual improvement in this. Concerning is the fact that the patient says  that Dr. Sampson Goon of infectious disease changed her from doxycycline to Augmentin apparently because of the elevated inflammatory markers. She also said he did a culture of this area but she is not heard the results. I don't think I have his information available on care everywhere however I will check this. She is using Hydrofera Blue rope. The  patient and her intake nurse report that she is still having identifiable drainage which certainly makes it clear that this is not closed. 01/02/17 on evaluation today patient appears to be doing fairly well in regard to her left gluteal wound. She has been continuing with the [facility] Dressing's here in our office. We actually see her once a month and then subsequently are performing weekly dressing changes with Hydrofera Blue Dressing rope during nurse visits one time a week. Today there really was not much drainage at this point. However this has happened previously where she had no drainage and was essentially thought to be close internally although the external had not completely pulled together. However then she reopened and began draining again. He has been mentioned the possibility of her seeing plastic surgery to try to get this finally and completely healed. 01/18/18 she is here in follow-up evaluation for right ischial pressure ulcer. She has an appointment with [facility] plastic surgery on 2/8 for evaluation. She continues to have minimal drainage at the tip of the dressing product. She continues to smoke, admits to 8-10 cigarettes per day. No change in treatment plan 02/06/18 on evaluation today patient appears to be doing about the same in regard to her right Ischial wound. She has been tolerating the dressing changes without complication. She did have her appointment at [facility] plastic surgery she was not really impressed. They stated that due to her weight they were unable to perform a flap and subsequently recommended the only thing they could attempt would be to exercise around the ulcer and then attempt to suture it together. The question is whether this would be effective or not. Patient really is not wanting to proceed down that road. Nonetheless she really have not changed much in regard to her wound measurements. 03/06/18 on evaluation today patient states that last week when I  perform the silver nitrate that she actually had some burning pain for about two days following. Since that time the area has been very dry and she states that it is also been very dark and scabbed around the area. With that being said other than this she feels that the hope was the fact that it was burning was a good sign and that they would be improvement but the really does not appear to be significant improvement at this point. We have tried a lot of different dressings for her most recently Hydrofera Blue Dressing, silver alginate rope, and collagen. We have never attempted Endoform which I think could be a possibility for her. 03/22/18-She is here for a follow-up evaluation of her right ischial pressure ulcer. There is no drainage on her dressings and what appears to be epidermal debris in the ulcer; she has no pain. The ulcer was cleanse with betadine, I believe it is healed, despite remaining a  1" depth. We will place a dry dressing over and evaluate next week for drainage, if no drainage she will be discharged at that time. 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 She underwent a prolonged course of IV antibiotics as directed by Dr. Sampson Goon. The tunneling right ischial pressure ulcer as eventually closed over. We've been following this over the last month or so with no identifiable area that Geralds, Fynlee L. ([id number]) was open. We've been using Endoform and hydrogel. She rising clinic today, our intake nurse reported some dampness. READMISSION 11/14/18 This patient is a 58 year old woman who we've previously had in clinic for a very long period of time finally culminating in April 2019. At that point she had bilateral pressure ulcers over her ischial tuberosities  complicated by underlying osteomyelitis and a large tracking area that gave her wound in the left popliteal fossa. She required a prolonged course of IV antibiotics and wound care in the last wound on her right ischial tuberosity was closed out in April/19. She claims that this is still closed. We did not review this. The patient is a type II diabetic on insulin. She states that 3 months ago she noted loose skin on her heel which she pulled off. She then had a deeper area that she filed down with an instrument. She's been left with a wound on the left heel which she says is actually better and less deep. More recently she's been using peroxide and leaving it open to air. 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 We were not able to obtain ABIs in this clinic 12/05/18; it's been 3 weeks since I saw this patient. Since then she went for her noninvasive arterial studies. On the left the patient had an SFA that was occluded at its origin with long segment chronic occlusion into the thigh. The distal segment of the SFA is open monophasic waveforms. There is some flow in the profunda femoral artery. The popliteal artery is monophasic anterior tibial and posterior tibials demonstrate low velocities in monophasic waveforms. The patient is a known diabetic. She is going for angiogram on 12/30. The wound is really deteriorated somewhat. There is black eschar. The wound is deeper. She has some erythema around the wound spreading above the lateral heel. There is also a small cyst that I don't remember seeing. This was not obviously infectious and I didn't open this today. She is going to need antibiotics she tells me she has doxycycline at home which is reasonable. 12/12/2018; she arrives in clinic this week with a report from her home health nurse that  things were not looking very good. Indeed there is nonviable tissue. The eschar is not quite as adherent but there is extension of the wound superiorly some surrounding erythema as well. She has been on doxycycline for a week may be somewhat longer. She has been using Santyl She is not systemically unwell in terms of fever although she tells me that since Thanksgiving she has been having postprandial nausea and vomiting and is not eating very well. There is no hematemesis and no clear complaints of abdominal pain. She states that she vomits roughly 2 hours after particles of undigested food. She also states she has lost 30 pounds over the course of this year 12/24/18 Seen today for follow-up and management of left heel wound. The wound still has black eschar with surrounding erythema and adherent slough above the lateral heel. She is running a low-grade fever today. She denies feeling bad recently or having  any flulike symptoms,Nausea, or vomiting. She did state since starting the antibiotic she has had a decrease in her appetite.. Recently finished a 1 month course of doxycycline. She stopped taking it on Saturday 12/22/18. Encouraged her to continue prescribe antibiotic for another week. She would benefit today from sharp debridement however due to current low- grade fever will not debride today. She is scheduled to follow back up with vascular specialist when January 09, 2019. Patient History Information obtained from Patient. Family History Cancer - Mother, Diabetes - Father, Hypertension - Mother,Father, Stroke - Mother, No family history of Heart Disease, Kidney Disease, Lung Disease, Seizures, Thyroid Problems. Social History Former smoker, Marital Status - Divorced, Alcohol Use - Never, Drug Use - Prior History, Caffeine Use - Rarely. Medical History Hospitalization/Surgery History - 04/09/2016, [facility], AMS. Cardell, Taiwana L. ([id number]) Review of Systems (ROS) Constitutional Symptoms  (General Health) The patient has no complaints or symptoms. Respiratory The patient has no complaints or symptoms. Cardiovascular The patient has no complaints or symptoms. Integumentary (Skin) Complains or has symptoms of Wounds - left heel. Objective Constitutional appears in no distress. Vitals Time Taken: 9:01 AM, Height: 63 in, Weight: 246 lbs, BMI: 43.6, Temperature: 99.9 F, Pulse: 108 bpm, Respiratory Rate: 18 breaths/min, Blood Pressure: 112/59 mmHg. Respiratory Respiratory effort is easy and symmetric bilaterally. Rate is normal at rest and on room air.. Cardiovascular Extremities are free of edema.. General Notes: Wound of lateral aspect of the left heel. Thick adherent eschar and slough. No debridement at this time due to vascular occlusion and low grade fever. Surrounding area of wound with maceration and nonviable tissue. Lightly removed some of the tissue with saline and gauze. There was no purulent drainage. Reviewed recent culture results with Ms. Funderburke during visit. Will continue current wound tx plan and doxycycline for an additional week. Integumentary (Hair, Skin) open wound left heel. Wound #6 status is Open. Original cause of wound was Trauma. The wound is located on the Left,Lateral Foot. The wound measures 5cm length x 3.5cm width x 0.2cm depth; 13.744cm^2 area and 2.749cm^3 volume. There is Fat Layer (Subcutaneous Tissue) Exposed exposed. There is no tunneling or undermining noted. There is a large amount of serosanguineous drainage noted. Foul odor after cleansing was noted. The wound margin is well defined and not attached to the wound base. There is no granulation within the wound bed. There is a large (67-100%) amount of necrotic tissue within the wound bed including Eschar and Adherent Slough. The periwound skin appearance exhibited: Callus, Dry/Scaly, Erythema. The periwound skin appearance did not exhibit: Crepitus, Excoriation, Induration, Rash,  Scarring, Maceration, Atrophie Blanche, Cyanosis, Ecchymosis, Hemosiderin Staining, Mottled, Pallor, Rubor. The surrounding wound skin color is noted with erythema. Erythema is marked. Periwound temperature was noted as No Abnormality. The periwound has tenderness on palpation. Muhlestein, Ahsha L. ([id number]) Assessment Active Problems ICD-10 Type 2 diabetes mellitus with foot ulcer Non-pressure chronic ulcer of left heel and midfoot with fat layer exposed Type 2 diabetes mellitus with diabetic polyneuropathy Type 2 diabetes mellitus with diabetic peripheral angiopathy without gangrene Plan Wound Cleansing: Wound #6 Left,Lateral Foot: Clean wound with Normal Saline. Anesthetic (add to Medication List): Wound #6 Left,Lateral Foot: Topical Lidocaine 4% cream applied to wound bed prior to debridement (In Clinic Only). Primary Wound Dressing: Wound #6 Left,Lateral Foot: Silver Alginate Secondary Dressing: Wound #6 Left,Lateral Foot: Conform/Kerlix - Heel cup Dressing Change Frequency: Wound #6 Left,Lateral Foot: Change Dressing Monday, Wednesday, Friday [facility] Follow-up Appointments: Wound #6 Left,Lateral Foot: Return  Appointment in 1 week. Edema Control: Wound #6 Left,Lateral Foot: Elevate legs to the level of the heart and pump ankles as often as possible Off-Loading: Wound #6 Left,Lateral Foot: Other: - Keep pressure off of left heel Additional Orders / Instructions: Wound #6 Left,Lateral Foot: Stop Smoking Home Health: Wound #6 Left,Lateral Foot: Continue Home Health Visits - [facility] Health Nurse may visit PRN to address patient s wound care needs. FACE TO FACE ENCOUNTER: MEDICARE and MEDICAID PATIENTS: I certify that this patient is under my care and that I had a face-to-face encounter that meets the physician face-to-face encounter requirements with this patient on this date. The encounter with the patient was in whole or in part for the following MEDICAL  CONDITION: (primary reason for Home Healthcare) MEDICAL NECESSITY: I certify, that based on my findings, NURSING services are a medically necessary home health service. HOME BOUND STATUS: I certify that my clinical findings support that this patient is homebound (i.e., Due to illness or injury, pt requires aid of supportive devices such as crutches, cane, wheelchairs, walkers, the use of special transportation or the assistance of another person to leave their place of residence. There is a normal inability to leave the home and doing so requires considerable and taxing effort. Other absences are for medical reasons / religious services and are infrequent or of short duration when for other reasons). If current dressing causes regression in wound condition, may D/C ordered dressing product/s and apply Normal Saline Deyton, Charlestine L. ([id number]) Moist Dressing daily until next Wound Healing Center / Other MD appointment. Notify Wound Healing Center of regression in wound condition at [phone]. Please direct any NON-WOUND related issues/requests for orders to patient's Primary Care Physician Electronic Signature(s) Signed: 12/24/2018 10:32:40 PM By: Wilkie Aye FNP-C Entered By: Wilkie Aye on 12/24/2018 10:05:19 Glotfelty, [name] ([id number]) -------------------------------------------------------------------------------- ROS/PFSH Details Patient Name: [name] Date of Service: 12/24/2018 9:00 AM Medical Record Number: [id number] Patient Account Number: [id number] Date of Birth/Sex: [birth year] (57 y.o. F) Treating RN: Arnette Norris Primary Care Provider: Joen [name] Other Clinician: Referring Provider: Joen [name] Treating Provider/Extender: Youlanda Roys in Treatment: 5 Information Obtained From Patient Wound History Do you currently have one or more open woundso No Have you tested positive for osteomyelitis (bone infection)o No Have you had any tests for  circulation on your legso No Integumentary (Skin) Complaints and Symptoms: Positive for: Wounds - left heel Medical History: Negative for: History of Burn; History of pressure wounds Constitutional Symptoms (General Health) Complaints and Symptoms: No Complaints or Symptoms Eyes Medical History: Negative for: Cataracts; Glaucoma; Optic Neuritis Ear/Nose/Mouth/Throat Medical History: Negative for: Chronic sinus problems/congestion; Middle ear problems Hematologic/Lymphatic Medical History: Negative for: Anemia; Hemophilia; Human Immunodeficiency Virus; Lymphedema; Sickle Cell Disease Respiratory Complaints and Symptoms: No Complaints or Symptoms Medical History: Positive for: Asthma Negative for: Aspiration; Chronic Obstructive Pulmonary Disease (COPD); Pneumothorax; Sleep Apnea; Tuberculosis Cardiovascular Complaints and Symptoms: No Complaints or Symptoms Medical History: Johannesen, Kiahna L. ([id number]) Positive for: Hypertension Negative for: Angina; Arrhythmia; Congestive Heart Failure; Coronary Artery Disease; Deep Vein Thrombosis; Hypotension; Myocardial Infarction; Peripheral Arterial Disease; Peripheral Venous Disease; Phlebitis; Vasculitis Gastrointestinal Medical History: Negative for: Cirrhosis ; Colitis; Crohnos; Hepatitis A; Hepatitis B; Hepatitis C Endocrine Medical History: Positive for: Type II Diabetes Negative for: Type I Diabetes Time with diabetes: 30 yrs Treated with: Insulin Blood sugar tested every day: Yes Tested : Blood sugar testing results: Breakfast: 121 Genitourinary Medical History: Negative for: End Stage Renal Disease Immunological Medical History: Negative  for: Lupus Erythematosus; Raynaudos; Scleroderma Musculoskeletal Medical History: Negative for: Gout; Rheumatoid Arthritis; Osteoarthritis; Osteomyelitis Neurologic Medical History: Positive for: Neuropathy Negative for: Dementia; Quadriplegia; Paraplegia; Seizure  Disorder Oncologic Medical History: Negative for: Received Chemotherapy; Received Radiation Psychiatric Medical History: Negative for: Anorexia/bulimia; Confinement Anxiety Immunizations Pneumococcal Vaccine: Received Pneumococcal Vaccination: No Implantable Devices Hospitalization / Surgery History JERALDIN, FESLER ([id number]) Name of Hospital Purpose of Hospitalization/Surgery Date [facility] AMS 04/09/2016 Family and Social History Cancer: Yes - Mother; Diabetes: Yes - Father; Heart Disease: No; Hypertension: Yes - Mother,Father; Kidney Disease: No; Lung Disease: No; Seizures: No; Stroke: Yes - Mother; Thyroid Problems: No; Former smoker; Marital Status - Divorced; Alcohol Use: Never; Drug Use: Prior History; Caffeine Use: Rarely; Advanced Directives: No; Patient does not want information on Advanced Directives; Living Will: No; Medical Power of Attorney: No Physician Affirmation I have reviewed and agree with the above information. Electronic Signature(s) Signed: 12/24/2018 10:32:40 PM By: Wilkie Aye FNP-C Signed: 12/25/2018 4:38:01 PM By: Arnette Norris Entered By: Wilkie Aye on 12/24/2018 09:42:50 Grussing, [name] ([id number]) -------------------------------------------------------------------------------- SuperBill Details Patient Name: [name] Date of Service: 12/24/2018 Medical Record Number: [id number] Patient Account Number: [id number] Date of Birth/Sex: August 01, 1961 (57 y.o. F) Treating RN: Arnette Norris Primary Care Provider: Joen [name] Other Clinician: Referring Provider: Joen [name] Treating Provider/Extender: Youlanda Roys in Treatment: 5 Diagnosis Coding ICD-10 Codes Code Description E11.621 Type 2 diabetes mellitus with foot ulcer L97.422 Non-pressure chronic ulcer of left heel and midfoot with fat layer exposed E11.42 Type 2 diabetes mellitus with diabetic polyneuropathy E11.51 Type 2 diabetes mellitus with diabetic peripheral angiopathy  without gangrene Facility Procedures CPT4 Code: 08657846 Description: 99213 - WOUND CARE VISIT-LEV 3 EST PT Modifier: Quantity: 1 Physician Procedures CPT4 Code: 9629528 Description: 41324 - WC PHYS LEVEL 2 - EST PT ICD-10 Diagnosis Description L97.422 Non-pressure chronic ulcer of left heel and midfoot with fat Modifier: layer exposed Quantity: 1 Electronic Signature(s) Signed: 12/24/2018 10:32:40 PM By: Wilkie Aye FNP-C Entered By: Wilkie Aye on 12/24/2018 10:05:36

## 2018-12-31 ENCOUNTER — Telehealth (INDEPENDENT_AMBULATORY_CARE_PROVIDER_SITE_OTHER): Payer: Self-pay

## 2018-12-31 ENCOUNTER — Other Ambulatory Visit (INDEPENDENT_AMBULATORY_CARE_PROVIDER_SITE_OTHER): Payer: Self-pay | Admitting: Vascular Surgery

## 2018-12-31 ENCOUNTER — Encounter
Admission: AD | Disposition: A | Discharge status: Skilled Nursing Facility | Payer: Self-pay | Source: Home / Self Care | Attending: Vascular Surgery

## 2018-12-31 ENCOUNTER — Encounter (INDEPENDENT_AMBULATORY_CARE_PROVIDER_SITE_OTHER): Payer: Self-pay | Admitting: Nurse Practitioner

## 2018-12-31 ENCOUNTER — Other Ambulatory Visit (INDEPENDENT_AMBULATORY_CARE_PROVIDER_SITE_OTHER): Payer: Self-pay | Admitting: Nurse Practitioner

## 2018-12-31 ENCOUNTER — Inpatient Hospital Stay
Admission: AD | Admit: 2018-12-31 | Discharge: 2019-01-18 | DRG: 270 | Disposition: A | Discharge status: Skilled Nursing Facility | Payer: Medicare HMO | Attending: Vascular Surgery | Admitting: Vascular Surgery

## 2018-12-31 ENCOUNTER — Ambulatory Visit (INDEPENDENT_AMBULATORY_CARE_PROVIDER_SITE_OTHER): Payer: Medicare HMO

## 2018-12-31 ENCOUNTER — Other Ambulatory Visit: Payer: Self-pay

## 2018-12-31 ENCOUNTER — Ambulatory Visit (INDEPENDENT_AMBULATORY_CARE_PROVIDER_SITE_OTHER): Payer: Medicare HMO | Admitting: Nurse Practitioner

## 2018-12-31 VITALS — BP 155/86 | HR 90 | Resp 18

## 2018-12-31 DIAGNOSIS — Z9862 Peripheral vascular angioplasty status: Secondary | ICD-10-CM

## 2018-12-31 DIAGNOSIS — E785 Hyperlipidemia, unspecified: Secondary | ICD-10-CM | POA: Diagnosis present

## 2018-12-31 DIAGNOSIS — M79605 Pain in left leg: Secondary | ICD-10-CM

## 2018-12-31 DIAGNOSIS — Y92230 Patient room in hospital as the place of occurrence of the external cause: Secondary | ICD-10-CM | POA: Diagnosis not present

## 2018-12-31 DIAGNOSIS — Z881 Allergy status to other antibiotic agents status: Secondary | ICD-10-CM

## 2018-12-31 DIAGNOSIS — I70249 Atherosclerosis of native arteries of left leg with ulceration of unspecified site: Secondary | ICD-10-CM

## 2018-12-31 DIAGNOSIS — L97929 Non-pressure chronic ulcer of unspecified part of left lower leg with unspecified severity: Secondary | ICD-10-CM | POA: Diagnosis not present

## 2018-12-31 DIAGNOSIS — I743 Embolism and thrombosis of arteries of the lower extremities: Secondary | ICD-10-CM | POA: Diagnosis present

## 2018-12-31 DIAGNOSIS — F17210 Nicotine dependence, cigarettes, uncomplicated: Secondary | ICD-10-CM | POA: Diagnosis present

## 2018-12-31 DIAGNOSIS — A4151 Sepsis due to Escherichia coli [E. coli]: Secondary | ICD-10-CM | POA: Diagnosis not present

## 2018-12-31 DIAGNOSIS — B9620 Unspecified Escherichia coli [E. coli] as the cause of diseases classified elsewhere: Secondary | ICD-10-CM | POA: Diagnosis present

## 2018-12-31 DIAGNOSIS — I110 Hypertensive heart disease with heart failure: Secondary | ICD-10-CM | POA: Diagnosis present

## 2018-12-31 DIAGNOSIS — L89320 Pressure ulcer of left buttock, unstageable: Secondary | ICD-10-CM | POA: Diagnosis present

## 2018-12-31 DIAGNOSIS — L8990 Pressure ulcer of unspecified site, unspecified stage: Secondary | ICD-10-CM

## 2018-12-31 DIAGNOSIS — I5033 Acute on chronic diastolic (congestive) heart failure: Secondary | ICD-10-CM | POA: Diagnosis not present

## 2018-12-31 DIAGNOSIS — T8744 Infection of amputation stump, left lower extremity: Secondary | ICD-10-CM | POA: Diagnosis not present

## 2018-12-31 DIAGNOSIS — E1122 Type 2 diabetes mellitus with diabetic chronic kidney disease: Secondary | ICD-10-CM

## 2018-12-31 DIAGNOSIS — E1152 Type 2 diabetes mellitus with diabetic peripheral angiopathy with gangrene: Secondary | ICD-10-CM | POA: Diagnosis present

## 2018-12-31 DIAGNOSIS — D638 Anemia in other chronic diseases classified elsewhere: Secondary | ICD-10-CM | POA: Diagnosis present

## 2018-12-31 DIAGNOSIS — T8754 Necrosis of amputation stump, left lower extremity: Secondary | ICD-10-CM | POA: Diagnosis not present

## 2018-12-31 DIAGNOSIS — Y835 Amputation of limb(s) as the cause of abnormal reaction of the patient, or of later complication, without mention of misadventure at the time of the procedure: Secondary | ICD-10-CM | POA: Diagnosis not present

## 2018-12-31 DIAGNOSIS — N183 Chronic kidney disease, stage 3 (moderate): Secondary | ICD-10-CM | POA: Diagnosis not present

## 2018-12-31 DIAGNOSIS — Z794 Long term (current) use of insulin: Secondary | ICD-10-CM

## 2018-12-31 DIAGNOSIS — Z1612 Extended spectrum beta lactamase (ESBL) resistance: Secondary | ICD-10-CM | POA: Diagnosis not present

## 2018-12-31 DIAGNOSIS — N390 Urinary tract infection, site not specified: Secondary | ICD-10-CM | POA: Diagnosis not present

## 2018-12-31 DIAGNOSIS — Z6841 Body Mass Index (BMI) 40.0 and over, adult: Secondary | ICD-10-CM | POA: Diagnosis not present

## 2018-12-31 DIAGNOSIS — K219 Gastro-esophageal reflux disease without esophagitis: Secondary | ICD-10-CM | POA: Diagnosis present

## 2018-12-31 DIAGNOSIS — J45909 Unspecified asthma, uncomplicated: Secondary | ICD-10-CM | POA: Diagnosis present

## 2018-12-31 DIAGNOSIS — Y732 Prosthetic and other implants, materials and accessory gastroenterology and urology devices associated with adverse incidents: Secondary | ICD-10-CM | POA: Diagnosis not present

## 2018-12-31 DIAGNOSIS — L89310 Pressure ulcer of right buttock, unstageable: Secondary | ICD-10-CM | POA: Diagnosis present

## 2018-12-31 DIAGNOSIS — T83518A Infection and inflammatory reaction due to other urinary catheter, initial encounter: Secondary | ICD-10-CM | POA: Diagnosis not present

## 2018-12-31 DIAGNOSIS — T79A0XA Compartment syndrome, unspecified, initial encounter: Secondary | ICD-10-CM | POA: Diagnosis not present

## 2018-12-31 DIAGNOSIS — F419 Anxiety disorder, unspecified: Secondary | ICD-10-CM | POA: Diagnosis present

## 2018-12-31 DIAGNOSIS — H409 Unspecified glaucoma: Secondary | ICD-10-CM | POA: Diagnosis present

## 2018-12-31 DIAGNOSIS — I70222 Atherosclerosis of native arteries of extremities with rest pain, left leg: Secondary | ICD-10-CM | POA: Diagnosis not present

## 2018-12-31 DIAGNOSIS — D62 Acute posthemorrhagic anemia: Secondary | ICD-10-CM | POA: Diagnosis not present

## 2018-12-31 DIAGNOSIS — Y93F9 Activity, other caregiving: Secondary | ICD-10-CM | POA: Diagnosis not present

## 2018-12-31 DIAGNOSIS — M1990 Unspecified osteoarthritis, unspecified site: Secondary | ICD-10-CM | POA: Diagnosis present

## 2018-12-31 DIAGNOSIS — J9601 Acute respiratory failure with hypoxia: Secondary | ICD-10-CM | POA: Diagnosis not present

## 2018-12-31 DIAGNOSIS — I7092 Chronic total occlusion of artery of the extremities: Secondary | ICD-10-CM | POA: Diagnosis not present

## 2018-12-31 DIAGNOSIS — Z7901 Long term (current) use of anticoagulants: Secondary | ICD-10-CM

## 2018-12-31 DIAGNOSIS — Z79899 Other long term (current) drug therapy: Secondary | ICD-10-CM

## 2018-12-31 DIAGNOSIS — R52 Pain, unspecified: Secondary | ICD-10-CM

## 2018-12-31 DIAGNOSIS — E876 Hypokalemia: Secondary | ICD-10-CM | POA: Diagnosis present

## 2018-12-31 DIAGNOSIS — W06XXXA Fall from bed, initial encounter: Secondary | ICD-10-CM | POA: Diagnosis not present

## 2018-12-31 DIAGNOSIS — M7989 Other specified soft tissue disorders: Secondary | ICD-10-CM | POA: Diagnosis not present

## 2018-12-31 DIAGNOSIS — I70202 Unspecified atherosclerosis of native arteries of extremities, left leg: Secondary | ICD-10-CM

## 2018-12-31 DIAGNOSIS — E1165 Type 2 diabetes mellitus with hyperglycemia: Secondary | ICD-10-CM | POA: Diagnosis not present

## 2018-12-31 DIAGNOSIS — R600 Localized edema: Secondary | ICD-10-CM

## 2018-12-31 DIAGNOSIS — I998 Other disorder of circulatory system: Secondary | ICD-10-CM | POA: Diagnosis present

## 2018-12-31 DIAGNOSIS — Z7902 Long term (current) use of antithrombotics/antiplatelets: Secondary | ICD-10-CM

## 2018-12-31 DIAGNOSIS — I96 Gangrene, not elsewhere classified: Secondary | ICD-10-CM | POA: Diagnosis not present

## 2018-12-31 HISTORY — PX: LOWER EXTREMITY ANGIOGRAPHY: CATH118251

## 2018-12-31 LAB — GLUCOSE, CAPILLARY
GLUCOSE-CAPILLARY: 122 mg/dL — AB (ref 70–99)
Glucose-Capillary: 188 mg/dL — ABNORMAL HIGH (ref 70–99)
Glucose-Capillary: 150 mg/dL — ABNORMAL HIGH (ref 70–99)
Glucose-Capillary: 181 mg/dL — ABNORMAL HIGH (ref 70–99)

## 2018-12-31 LAB — BASIC METABOLIC PANEL
ANION GAP: 13 (ref 5–15)
BUN: 16 mg/dL (ref 6–20)
CO2: 29 mmol/L (ref 22–32)
Calcium: 6 mg/dL — CL (ref 8.9–10.3)
Chloride: 91 mmol/L — ABNORMAL LOW (ref 98–111)
Creatinine, Ser: 0.85 mg/dL (ref 0.44–1.00)
GFR calc Af Amer: >60 mL/min (ref >60)
GFR calc non Af Amer: >60 mL/min (ref >60)
Glucose, Bld: 113 mg/dL — ABNORMAL HIGH (ref 70–99)
Potassium: 2.8 mmol/L — ABNORMAL LOW (ref 3.5–5.1)
Sodium: 133 mmol/L — ABNORMAL LOW (ref 135–145)

## 2018-12-31 LAB — CBC
HCT: 30.6 % — ABNORMAL LOW (ref 36.0–46.0)
HCT: 30.6 % — ABNORMAL LOW (ref 36.0–46.0)
Hemoglobin: 9.3 g/dL — ABNORMAL LOW (ref 12.0–15.0)
Hemoglobin: 9.4 g/dL — ABNORMAL LOW (ref 12.0–15.0)
MCH: 25.2 pg — AB (ref 26.0–34.0)
MCH: 25.5 pg — ABNORMAL LOW (ref 26.0–34.0)
MCHC: 30.4 g/dL (ref 30.0–36.0)
MCHC: 30.7 g/dL (ref 30.0–36.0)
MCV: 82 fL (ref 80.0–100.0)
MCV: 83.8 fL (ref 80.0–100.0)
NRBC: 0 % (ref 0.0–0.2)
Platelets: 522 10*3/uL — ABNORMAL HIGH (ref 150–400)
Platelets: 532 10*3/uL — ABNORMAL HIGH (ref 150–400)
RBC: 3.73 MIL/uL — AB (ref 3.87–5.11)
RBC: 3.65 MIL/uL — ABNORMAL LOW (ref 3.87–5.11)
RDW: 15.8 % — ABNORMAL HIGH (ref 11.5–15.5)
RDW: 15.9 % — ABNORMAL HIGH (ref 11.5–15.5)
WBC: 28.1 10*3/uL — AB (ref 4.0–10.5)
WBC: 27.2 10*3/uL — ABNORMAL HIGH (ref 4.0–10.5)
nRBC: 0 % (ref 0.0–0.2)

## 2018-12-31 LAB — FIBRINOGEN

## 2018-12-31 LAB — PROTIME-INR
INR: 1.28
Prothrombin Time: 15.9 seconds — ABNORMAL HIGH (ref 11.4–15.2)

## 2018-12-31 LAB — CREATININE, SERUM
CREATININE: 0.97 mg/dL (ref 0.44–1.00)
GFR calc Af Amer: >60 mL/min (ref >60)

## 2018-12-31 LAB — APTT: aPTT: 51 seconds — ABNORMAL HIGH (ref 24–36)

## 2018-12-31 LAB — BUN: BUN: 16 mg/dL (ref 6–20)

## 2018-12-31 LAB — MAGNESIUM: MAGNESIUM: 0.5 mg/dL — AB (ref 1.7–2.4)

## 2018-12-31 SURGERY — LOWER EXTREMITY ANGIOGRAPHY
Anesthesia: Moderate Sedation | Laterality: Left

## 2018-12-31 MED ORDER — OXYCODONE HCL 5 MG PO TABS: 10.0000 mg | ORAL_TABLET | ORAL | Status: DC | PRN

## 2018-12-31 MED ORDER — INSULIN ASPART 100 UNIT/ML ~~LOC~~ SOLN
0.0000 [IU] | Freq: Three times a day (TID) | SUBCUTANEOUS | Status: DC
  Administered 2019-01-01 – 2019-01-02 (×4): 7 [IU] via SUBCUTANEOUS
  Administered 2019-01-03: 3 [IU] via SUBCUTANEOUS
  Administered 2019-01-03 – 2019-01-04 (×2): 4 [IU] via SUBCUTANEOUS
  Administered 2019-01-04: 7 [IU] via SUBCUTANEOUS
  Administered 2019-01-05: 3 [IU] via SUBCUTANEOUS
  Administered 2019-01-05 – 2019-01-06 (×2): 4 [IU] via SUBCUTANEOUS
  Administered 2019-01-07: 3 [IU] via SUBCUTANEOUS
  Administered 2019-01-08: 7 [IU] via SUBCUTANEOUS
  Administered 2019-01-08: 4 [IU] via SUBCUTANEOUS
  Administered 2019-01-09 (×2): 3 [IU] via SUBCUTANEOUS
  Administered 2019-01-10: 4 [IU] via SUBCUTANEOUS
  Administered 2019-01-10: 3 [IU] via SUBCUTANEOUS
  Administered 2019-01-10: 4 [IU] via SUBCUTANEOUS
  Administered 2019-01-11 (×2): 3 [IU] via SUBCUTANEOUS
  Administered 2019-01-12: 7 [IU] via SUBCUTANEOUS
  Administered 2019-01-12: 4 [IU] via SUBCUTANEOUS
  Administered 2019-01-13: 7 [IU] via SUBCUTANEOUS
  Administered 2019-01-13 (×2): 3 [IU] via SUBCUTANEOUS
  Administered 2019-01-14: 11 [IU] via SUBCUTANEOUS
  Administered 2019-01-14: 4 [IU] via SUBCUTANEOUS
  Administered 2019-01-15: 3 [IU] via SUBCUTANEOUS
  Administered 2019-01-15: 4 [IU] via SUBCUTANEOUS
  Administered 2019-01-15: 7 [IU] via SUBCUTANEOUS
  Administered 2019-01-16 – 2019-01-17 (×5): 4 [IU] via SUBCUTANEOUS
  Filled 2018-12-31 (×36): qty 1

## 2018-12-31 MED ORDER — HEPARIN SODIUM (PORCINE) 1000 UNIT/ML IJ SOLN
INTRAMUSCULAR | Status: AC
  Filled 2018-12-31: qty 1

## 2018-12-31 MED ORDER — SODIUM CHLORIDE 0.9 % IV SOLN
0.5000 mg/h | INTRAVENOUS | Status: DC
  Filled 2018-12-31: qty 10

## 2018-12-31 MED ORDER — SODIUM CHLORIDE 0.9 % IV SOLN: INTRAVENOUS | Status: DC

## 2018-12-31 MED ORDER — GABAPENTIN 600 MG PO TABS
1200.0000 mg | ORAL_TABLET | Freq: Three times a day (TID) | ORAL | Status: DC
  Administered 2018-12-31 – 2019-01-18 (×51): 1200 mg via ORAL
  Filled 2018-12-31 (×53): qty 2

## 2018-12-31 MED ORDER — MIDAZOLAM HCL 2 MG/2ML IJ SOLN
INTRAMUSCULAR | Status: DC | PRN
  Administered 2018-12-31 (×4): 1 mg via INTRAVENOUS
  Administered 2018-12-31: 2 mg via INTRAVENOUS

## 2018-12-31 MED ORDER — INSULIN GLARGINE 100 UNIT/ML ~~LOC~~ SOLN
70.0000 [IU] | Freq: Every day | SUBCUTANEOUS | Status: DC
  Filled 2018-12-31 (×2): qty 0.7

## 2018-12-31 MED ORDER — FAMOTIDINE 20 MG PO TABS: 40.0000 mg | ORAL_TABLET | ORAL | Status: DC | PRN

## 2018-12-31 MED ORDER — KETOROLAC TROMETHAMINE 30 MG/ML IJ SOLN
30.0000 mg | Freq: Four times a day (QID) | INTRAMUSCULAR | Status: DC
  Administered 2018-12-31 – 2019-01-03 (×11): 30 mg via INTRAVENOUS
  Filled 2018-12-31 (×11): qty 1

## 2018-12-31 MED ORDER — HYDROMORPHONE HCL 1 MG/ML IJ SOLN: 1.0000 mg | Freq: Once | INTRAMUSCULAR | Status: DC

## 2018-12-31 MED ORDER — LATANOPROST 0.005 % OP SOLN
1.0000 [drp] | Freq: Every day | OPHTHALMIC | Status: DC
  Administered 2018-12-31 – 2019-01-17 (×18): 1 [drp] via OPHTHALMIC
  Filled 2018-12-31 (×2): qty 2.5

## 2018-12-31 MED ORDER — OXYBUTYNIN CHLORIDE ER 5 MG PO TB24
10.0000 mg | ORAL_TABLET | Freq: Every day | ORAL | Status: DC
  Administered 2019-01-04 – 2019-01-17 (×2): 10 mg via ORAL
  Filled 2018-12-31: qty 2
  Filled 2018-12-31: qty 1
  Filled 2018-12-31: qty 2
  Filled 2018-12-31: qty 1
  Filled 2018-12-31 (×8): qty 2

## 2018-12-31 MED ORDER — CEFAZOLIN SODIUM-DEXTROSE 2-4 GM/100ML-% IV SOLN
2.0000 g | INTRAVENOUS | Status: AC
  Administered 2019-01-01: 2 g via INTRAVENOUS
  Filled 2018-12-31: qty 100

## 2018-12-31 MED ORDER — HYDROMORPHONE HCL 1 MG/ML IJ SOLN
1.0000 mg | Freq: Once | INTRAMUSCULAR | Status: AC | PRN
  Administered 2018-12-31: 1 mg via INTRAVENOUS
  Filled 2018-12-31: qty 1

## 2018-12-31 MED ORDER — METHYLPREDNISOLONE SODIUM SUCC 125 MG IJ SOLR: 125.0000 mg | INTRAMUSCULAR | Status: DC | PRN

## 2018-12-31 MED ORDER — ACETAMINOPHEN 650 MG RE SUPP: 650.0000 mg | Freq: Four times a day (QID) | RECTAL | Status: DC | PRN

## 2018-12-31 MED ORDER — PANTOPRAZOLE SODIUM 20 MG PO TBEC
20.0000 mg | DELAYED_RELEASE_TABLET | Freq: Every day | ORAL | Status: DC
  Administered 2019-01-01 – 2019-01-18 (×16): 20 mg via ORAL
  Filled 2018-12-31 (×19): qty 1

## 2018-12-31 MED ORDER — CEFAZOLIN SODIUM-DEXTROSE 2-4 GM/100ML-% IV SOLN: 2.0000 g | Freq: Once | INTRAVENOUS | Status: DC

## 2018-12-31 MED ORDER — LIDOCAINE-EPINEPHRINE (PF) 1 %-1:200000 IJ SOLN
INTRAMUSCULAR | Status: AC
  Filled 2018-12-31: qty 10

## 2018-12-31 MED ORDER — FENTANYL CITRATE (PF) 100 MCG/2ML IJ SOLN
INTRAMUSCULAR | Status: DC | PRN
  Administered 2018-12-31 (×2): 25 ug via INTRAVENOUS
  Administered 2018-12-31: 50 ug via INTRAVENOUS
  Administered 2018-12-31 (×2): 25 ug via INTRAVENOUS

## 2018-12-31 MED ORDER — INSULIN ASPART 100 UNIT/ML ~~LOC~~ SOLN: 15.0000 [IU] | Freq: Three times a day (TID) | SUBCUTANEOUS | Status: DC

## 2018-12-31 MED ORDER — METOPROLOL TARTRATE 25 MG PO TABS
25.0000 mg | ORAL_TABLET | Freq: Two times a day (BID) | ORAL | Status: DC
  Administered 2018-12-31 – 2019-01-18 (×33): 25 mg via ORAL
  Filled 2018-12-31 (×34): qty 1

## 2018-12-31 MED ORDER — TRAZODONE HCL 50 MG PO TABS
50.0000 mg | ORAL_TABLET | Freq: Every day | ORAL | Status: DC
  Administered 2019-01-04 – 2019-01-17 (×2): 50 mg via ORAL
  Filled 2018-12-31 (×13): qty 1

## 2018-12-31 MED ORDER — ALTEPLASE 2 MG IJ SOLR
INTRAMUSCULAR | Status: AC
  Filled 2018-12-31: qty 8

## 2018-12-31 MED ORDER — FENTANYL CITRATE (PF) 100 MCG/2ML IJ SOLN
INTRAMUSCULAR | Status: AC
  Filled 2018-12-31: qty 2

## 2018-12-31 MED ORDER — ATORVASTATIN CALCIUM 20 MG PO TABS
20.0000 mg | ORAL_TABLET | Freq: Every day | ORAL | Status: DC
  Administered 2019-01-01 – 2019-01-17 (×17): 20 mg via ORAL
  Filled 2018-12-31 (×17): qty 1

## 2018-12-31 MED ORDER — NORTRIPTYLINE HCL 25 MG PO CAPS
50.0000 mg | ORAL_CAPSULE | Freq: Every day | ORAL | Status: DC
  Administered 2018-12-31 – 2019-01-17 (×18): 50 mg via ORAL
  Filled 2018-12-31 (×19): qty 2

## 2018-12-31 MED ORDER — HEPARIN (PORCINE) IN NACL 1000-0.9 UT/500ML-% IV SOLN
INTRAVENOUS | Status: AC
  Filled 2018-12-31: qty 1000

## 2018-12-31 MED ORDER — MIDAZOLAM HCL 5 MG/5ML IJ SOLN
INTRAMUSCULAR | Status: AC
  Filled 2018-12-31: qty 5

## 2018-12-31 MED ORDER — ONDANSETRON HCL 4 MG/2ML IJ SOLN: 4.0000 mg | Freq: Four times a day (QID) | INTRAMUSCULAR | Status: DC | PRN

## 2018-12-31 MED ORDER — ACETAMINOPHEN 325 MG PO TABS
650.0000 mg | ORAL_TABLET | Freq: Four times a day (QID) | ORAL | Status: DC | PRN
  Administered 2019-01-04 – 2019-01-17 (×7): 650 mg via ORAL
  Filled 2018-12-31 (×7): qty 2

## 2018-12-31 MED ORDER — HEPARIN SODIUM (PORCINE) 1000 UNIT/ML IJ SOLN
INTRAMUSCULAR | Status: DC | PRN
  Administered 2018-12-31: 3000 [IU] via INTRAVENOUS

## 2018-12-31 MED ORDER — ALTEPLASE 2 MG IJ SOLR
INTRAMUSCULAR | Status: DC | PRN
  Administered 2018-12-31: 8 mg

## 2018-12-31 MED ORDER — SODIUM CHLORIDE 0.9 % IV SOLN
1.0000 mg/h | INTRAVENOUS | Status: AC
  Administered 2018-12-31: 1 mg/h
  Filled 2018-12-31: qty 10

## 2018-12-31 MED ORDER — HYDROMORPHONE HCL 1 MG/ML IJ SOLN
INTRAMUSCULAR | Status: AC
  Filled 2018-12-31: qty 1

## 2018-12-31 MED ORDER — HYDROMORPHONE HCL 1 MG/ML IJ SOLN
1.0000 mg | Freq: Once | INTRAMUSCULAR | Status: AC
  Administered 2018-12-31: 1 mg via INTRAVENOUS

## 2018-12-31 MED ORDER — HEPARIN (PORCINE) 25000 UT/250ML-% IV SOLN
INTRAVENOUS | Status: AC
  Administered 2018-12-31: 600 [IU]/h via INTRAVENOUS
  Filled 2018-12-31: qty 250

## 2018-12-31 MED ORDER — IOPAMIDOL (ISOVUE-300) INJECTION 61%
INTRAVENOUS | Status: DC | PRN
  Administered 2018-12-31: 20 mL via INTRAVENOUS

## 2018-12-31 MED ORDER — MAGNESIUM SULFATE 4 GM/100ML IV SOLN
4.0000 g | Freq: Once | INTRAVENOUS | Status: AC
  Administered 2018-12-31: 4 g via INTRAVENOUS
  Filled 2018-12-31: qty 100

## 2018-12-31 MED ORDER — OXYCODONE HCL 5 MG PO TABS
5.0000 mg | ORAL_TABLET | ORAL | Status: DC | PRN
  Administered 2018-12-31: 5 mg via ORAL
  Filled 2018-12-31: qty 1

## 2018-12-31 MED ORDER — HEPARIN (PORCINE) 25000 UT/250ML-% IV SOLN
600.0000 [IU]/h | INTRAVENOUS | Status: DC
  Administered 2018-12-31: 600 [IU]/h via INTRAVENOUS

## 2018-12-31 MED ORDER — DULOXETINE HCL 30 MG PO CPEP
60.0000 mg | ORAL_CAPSULE | Freq: Every day | ORAL | Status: DC
  Filled 2018-12-31: qty 2
  Filled 2018-12-31: qty 1
  Filled 2018-12-31: qty 2

## 2018-12-31 MED ORDER — MORPHINE SULFATE (PF) 4 MG/ML IV SOLN
2.0000 mg | INTRAVENOUS | Status: DC | PRN
  Administered 2018-12-31 – 2019-01-01 (×2): 2 mg via INTRAVENOUS
  Filled 2018-12-31 (×2): qty 1

## 2018-12-31 MED ORDER — HYDROMORPHONE HCL 1 MG/ML IJ SOLN
1.0000 mg | Freq: Once | INTRAMUSCULAR | Status: AC | PRN
  Administered 2018-12-31: 1 mg via INTRAVENOUS

## 2018-12-31 MED ORDER — CALCIUM GLUCONATE-NACL 1-0.675 GM/50ML-% IV SOLN
1.0000 g | Freq: Once | INTRAVENOUS | Status: AC
  Administered 2018-12-31: 1000 mg via INTRAVENOUS
  Filled 2018-12-31: qty 50

## 2018-12-31 MED ORDER — FUROSEMIDE 20 MG PO TABS
20.0000 mg | ORAL_TABLET | Freq: Every day | ORAL | Status: DC
  Administered 2019-01-01 – 2019-01-11 (×10): 20 mg via ORAL
  Filled 2018-12-31 (×10): qty 1

## 2018-12-31 MED ORDER — SODIUM CHLORIDE 0.9 % IV SOLN
INTRAVENOUS | Status: DC
  Administered 2019-01-01 – 2019-01-02 (×3): via INTRAVENOUS

## 2018-12-31 MED ORDER — HYDROMORPHONE HCL 1 MG/ML IJ SOLN
INTRAMUSCULAR | Status: AC
  Administered 2018-12-31: 1 mg via INTRAVENOUS
  Filled 2018-12-31: qty 1

## 2018-12-31 MED ORDER — POTASSIUM CHLORIDE CRYS ER 20 MEQ PO TBCR
40.0000 meq | EXTENDED_RELEASE_TABLET | ORAL | Status: AC
  Administered 2018-12-31 (×2): 40 meq via ORAL
  Filled 2018-12-31 (×2): qty 2

## 2018-12-31 MED ORDER — INSULIN ASPART 100 UNIT/ML ~~LOC~~ SOLN
0.0000 [IU] | Freq: Every day | SUBCUTANEOUS | Status: DC
  Administered 2019-01-01: 2 [IU] via SUBCUTANEOUS
  Administered 2019-01-07: 3 [IU] via SUBCUTANEOUS
  Administered 2019-01-13: 2 [IU] via SUBCUTANEOUS
  Filled 2018-12-31 (×3): qty 1

## 2018-12-31 SURGICAL SUPPLY — 9 items
CATH BEACON 5 .035 65 KMP TIP (CATHETERS) ×2 IMPLANT
CATH BEACON 5 .035 65 RIM TIP (CATHETERS) ×2 IMPLANT
CATH INFUS 135CMX50CM (CATHETERS) ×2 IMPLANT
GLIDEWIRE ADV .035X180CM (WIRE) ×2 IMPLANT
KIT CATH CVC 3 LUMEN 7FR 8IN (MISCELLANEOUS) ×2 IMPLANT
PACK ANGIOGRAPHY (CUSTOM PROCEDURE TRAY) ×3 IMPLANT
SHEATH BRITE TIP 5FRX11 (SHEATH) ×2 IMPLANT
SHEATH PINNACLE ST 6F 45CM (SHEATH) ×2 IMPLANT
WIRE MAGIC TORQUE 260C (WIRE) ×2 IMPLANT

## 2018-12-31 NOTE — Op Note (Signed)
[facility]  Percutaneous Study/Intervention Procedural Note   Date of Surgery: 12/31/2018  Surgeon(s):Shyleigh Daughtry    Assistants:none  Pre-operative Diagnosis: PAD with rest pain and ulceration LLE  Post-operative diagnosis:  Same  Procedure(s) Performed:             1.  Ultrasound guidance for vascular access right femoral artery             2.  Catheter placement into left common femoral artery from right femoral approach             3.  Selective left lower extremity angiogram             4.   Placement of 8 mg of TPA with an infusion catheter to the left SFA and popliteal arteries             5.   Placement of infusion catheter in left SFA, popliteal artery, and proximal posterior tibial artery for continuous infusion of TPA overnight  6.  Placement of a right femoral venous triple-lumen catheter with ultrasound and fluoroscopy guidance    EBL: 5 cc  Contrast: 20 cc  Fluoro Time: 3.1 minutes  Moderate Conscious Sedation Time: approximately 25 minutes using 6 mg of Versed and 150 Mcg of Fentanyl              Indications:  Patient is a 58 y.o.female with an intervention last month with continued ulceration and rest pain of the left leg. The patient has noninvasive study showing essentially no flow below the left common femoral and profunda femoris artery. The patient is brought in for angiography for further evaluation and potential treatment.  Due to the limb threatening nature of the situation, angiogram was performed for attempted limb salvage. The patient is aware that if the procedure fails, amputation would be expected.  The patient also understands that even with successful revascularization, amputation may still be required due to the severity of the situation. Risks and benefits are discussed and informed consent is obtained.   Procedure:  The patient was identified and appropriate procedural time out was performed.  The patient was then placed supine  on the table and prepped and draped in the usual sterile fashion. Moderate conscious sedation was administered during a face to face encounter with the patient throughout the procedure with my supervision of the RN administering medicines and monitoring the patient's vital signs, pulse oximetry, telemetry and mental status throughout from the start of the procedure until the patient was taken to the recovery room. Ultrasound was used to evaluate the right common femoral artery.  It was patent .  A digital ultrasound image was acquired.  A Seldinger needle was used to access the right common femoral artery under direct ultrasound guidance and a permanent image was performed.  A 0.035 J wire was advanced without resistance and a 5Fr sheath was placed.   Aortogram was not required and I used a rim catheter and advantage wire and I then crossed the aortic bifurcation and advanced to the left femoral head. Selective left lower extremity angiogram was then performed. This demonstrated normal common femoral artery and profunda femorus artery.  The SFA demonstrated a flush occlusion and essentially no reconstitution or distal runoff was seen. It was felt that it was in the patient's best interest to proceed with intervention after these images to avoid a second procedure and a larger amount of contrast and fluoroscopy based off of the findings from the initial  angiogram. The patient was systemically heparinized and a 6 [place] Destination sheath was then placed over the [employer] wire. I then used a Kumpe catheter and the advantage wire to easily cross the occlusion.  With this amount of thrombus, it was clear this was not going to clean up easily and I felt continuous infusion of TPA would be in the patient's best interest.  A 130 cm total length 50 cm working length infusion catheter was then placed and 8 mg of TPA was instilled in the left SFA and popliteal arteries.  I advanced the catheter with the help of the  wire so that the proximal portion was just out of the sheath in the proximal SFA and the distal portion terminated in the proximal posterior tibial artery.  It was then secured to the skin with silk sutures.  Using the same femoral site, a venous triple-lumen catheter was placed for durable venous access and also allowing blood draws without being stuck.  The right femoral vein was visualized with ultrasound and found to be widely patent.  It was then accessed under direct ultrasound guidance without difficulty with a Seldinger needle.  A permanent image was recorded.  A J-wire was placed.  After skin nick and dilatation a triple-lumen catheter was placed over the wire and the wire was removed.  The catheter tip was parked at the iliac confluence near the IVC terminus under fluoroscopic guidance and the catheter was secured to the skin with 3 silk sutures. I elected to terminate the procedure. The patient was taken to the recovery room in stable condition having tolerated the procedure well.  Findings:                            Left Lower Extremity:  normal common femoral artery and profunda femorus artery.  The SFA demonstrated a flush occlusion and essentially no reconstitution or distal runoff was seen.   Disposition: Patient was taken to the recovery room in stable condition having tolerated the procedure well.  Complications: None  Festus Barren 12/31/2018 5:51 PM   This note was created with Dragon Medical transcription system. Any errors in dictation are purely unintentional.

## 2018-12-31 NOTE — Progress Notes (Signed)
Subjective:    Patient ID: [name], female    DOB: 1961-05-17, 58 y.o.   MRN: [id number] No chief complaint on file. Chief complaint: Leg pain  HPI  [name] is a 58 y.o. female that contacted our office with concerns for worsening ulceration was severe pain following angiogram on 12/17/2018.  She presented to our office unable to walk and required use of a wheelchair.  Her left lower extremity is extremely edematous, cold, and having early color changes.  She denies any fever, chills, nausea, vomiting or diarrhea.  Her left leg is weeping profusely.  She rates her pain as severe.  Preliminary results of her lower extremity duplex reveals flow within the common femoral artery and deep femoral artery however everything at the level of the superficial femoral artery and below, including the popliteal artery and tibial arteries are occluded.  Past Medical History:  Diagnosis Date  . Acute pain of right shoulder 05/19/2016  . Acute PN (pyelonephritis) 05/18/2016  . Acute pyelonephritis 05/18/2016  . Anxiety   . Arthritis   . Asthma   . Diabetes mellitus without complication (HCC)   . GERD (gastroesophageal reflux disease)   . Glaucoma   . Hyperlipemia   . Hyperlipemia   . Hypertension   . Peripheral vascular disease [facility])     Past Surgical History:  Procedure Laterality Date  . ANTERIOR CRUCIATE LIGAMENT REPAIR    . LOWER EXTREMITY ANGIOGRAPHY Left 12/17/2018   Procedure: LOWER EXTREMITY ANGIOGRAPHY;  Surgeon: Annice Needy, MD;  Location: [facility] INVASIVE CV LAB;  Service: Cardiovascular;  Laterality: Left;    Social History   Socioeconomic History  . Marital status: Divorced    Spouse name: Not on file  . Number of children: Not on file  . Years of education: Not on file  . Highest education level: Not on file  Occupational History  . Not on file  Social Needs  . Financial resource strain: Patient refused  . Food insecurity:    Worry: Patient refused   Inability: Patient refused  . Transportation needs:    Medical: Patient refused    Non-medical: Patient refused  Tobacco Use  . Smoking status: Current Every Day Smoker    Packs/day: 0.50  . Smokeless tobacco: Never Used  Substance and Sexual Activity  . Alcohol use: No  . Drug use: Never  . Sexual activity: Not on file  Lifestyle  . Physical activity:    Days per week: Patient refused    Minutes per session: Patient refused  . Stress: Patient refused  Relationships  . Social connections:    Talks on phone: Patient refused    Gets together: Patient refused    Attends religious service: Patient refused    Active member of club or organization: Patient refused    Attends meetings of clubs or organizations: Patient refused    Relationship status: Patient refused  . Intimate partner violence:    Fear of current or ex partner: Patient refused    Emotionally abused: Patient refused    Physically abused: Patient refused    Forced sexual activity: Patient refused  Other Topics Concern  . Not on file  Social History Narrative  . Not on file    Family History  Problem Relation Age of Onset  . Diabetes Mother   . Heart disease Mother   . Stroke Mother   . Kidney cancer Neg Hx   . Prostate cancer Neg Hx  Allergies  Allergen Reactions  . Biaxin [Clarithromycin] Rash    Patient states this medication gives her severe rash and thrush in the mouth.     Review of Systems   Review of Systems: Negative Unless Checked Constitutional: [] Weight loss  [] Fever  [] Chills Cardiac: [] Chest pain   []  Atrial Fibrillation  [] Palpitations   [] Shortness of breath when laying flat   [] Shortness of breath with exertion. [] Shortness of breath at rest Vascular:  [] Pain in legs with walking   [] Pain in legs with standing [x] Pain in legs when laying flat   [x] Claudication    [] Pain in feet when laying flat    [] History of DVT   [] Phlebitis   [x] Swelling in legs   [] Varicose veins    [x] Non-healing ulcers Pulmonary:   [] Uses home oxygen   [] Productive cough   [] Hemoptysis   [] Wheeze  [x] COPD   [] Asthma Neurologic:  [] Dizziness   [] Seizures  [] Blackouts [] History of stroke   [] History of TIA  [] Aphasia   [] Temporary Blindness   [] Weakness or numbness in arm   [x] Weakness or numbness in leg Musculoskeletal:   [] Joint swelling   [] Joint pain   [] Low back pain  []  History of Knee Replacement [] Arthritis [] back Surgeries  []  Spinal Stenosis    Hematologic:  [] Easy bruising  [] Easy bleeding   [] Hypercoagulable state   [] Anemic Gastrointestinal:  [] Diarrhea   [] Vomiting  [] Gastroesophageal reflux/heartburn   [] Difficulty swallowing. [] Abdominal pain Genitourinary:  [x] Chronic kidney disease   [] Difficult urination  [] Anuric   [] Blood in urine [] Frequent urination  [] Burning with urination   [] Hematuria Skin:  [] Rashes   [] Ulcers [] Wounds Psychological:  [] History of anxiety   []  History of major depression  []  Memory Difficulties     Objective:   Physical Exam  There were no vitals taken for this visit.  Gen: WD/WN, NAD Head: [place]/AT, No temporalis wasting.  Ear/Nose/Throat: Hearing grossly intact, nares w/o erythema or drainage Eyes: PER, EOMI, sclera nonicteric.  Neck: Supple, no masses.  No JVD.  Pulmonary:  Good air movement, no use of accessory muscles.  Cardiac: RRR Vascular:  Pulseless, cold, mottling blue left lower extremity Vessel Right Left  Radial Palpable Palpable  Dorsalis Pedis Palpable Not Palpable  Posterior Tibial Palpable Not Palpable   Gastrointestinal: soft, non-distended. No guarding/no peritoneal signs.  Musculoskeletal: M/S 5/5 throughout.  No deformity or atrophy.  Neurologic: Pain and light touch intact in extremities.  Symmetrical.  Speech is fluent. Motor exam as listed above. Psychiatric: Judgment intact, Mood & affect appropriate for pt's clinical situation. Dermatologic:   No changes consistent with cellulitis. Lymph : No Cervical  lymphadenopathy, excessive weeping of left lower extremity      Assessment & Plan:   1. Ischemic leg After receiving preliminary ultrasound results I immediately spoke with Dr. Wyn Quakerew and arrange for her to go directly to interventional radiology for treatment.  I spoke with the patient about her immediate need to go to the hospital and present to the check-in desk for another angiogram.  Initially the patient was hesitant and did not want to go, however I advised her that not doing so at this time could result in the loss of her leg as her leg was beginning to show early ischemic changes.  The patient understood and advised that she would be going directly to the hospital.  Dr. Wyn Quakerew is aware and preparing for her arrival.  2. Type 2 diabetes mellitus with stage 3 chronic kidney disease, with long-term current use  of insulin (HCC) Continue hypoglycemic medications as already ordered, these medications have been reviewed and there are no changes at this time.  Hgb A1C to be monitored as already arranged by primary service   3. Bilateral edema of lower extremity Patient has profuse weeping and control of edema is necessary, however at this time the ischemia of her left lower leg takes priority.  We will address the weeping and venous ulceration once the ischemic leg has been addressed.   Current Outpatient Medications on File Prior to Visit  Medication Sig Dispense Refill  . albuterol (PROAIR HFA) 108 (90 Base) MCG/ACT inhaler     . Ascorbic Acid (VITAMIN C) 1000 MG tablet Take 1,000 mg by mouth daily.    Marland Kitchen. aspirin EC 81 MG tablet Take 1 tablet (81 mg total) by mouth daily. 150 tablet 2  . atorvastatin (LIPITOR) 20 MG tablet Take 20 mg by mouth at bedtime.  12  . Calcium Carbonate-Vitamin D3 (CALCIUM 600-D) 600-400 MG-UNIT TABS Take by mouth 2 (two) times daily.    . cholecalciferol (VITAMIN D) 1000 units tablet Take 5,000 Units by mouth daily.    . clopidogrel (PLAVIX) 75 MG tablet Take 1  tablet (75 mg total) by mouth daily. 30 tablet 11  . DULoxetine (CYMBALTA) 20 MG capsule Take 60 mg by mouth daily.     . fluticasone (FLONASE) 50 MCG/ACT nasal spray     . furosemide (LASIX) 20 MG tablet Take 1 tablet (20 mg total) by mouth daily. 30 tablet 0  . gabapentin (NEURONTIN) 600 MG tablet Take 1,200 mg by mouth 3 (three) times daily.     . insulin glargine (LANTUS) 100 UNIT/ML injection Inject 70 Units into the skin daily.    . insulin lispro (HUMALOG) 100 UNIT/ML injection Inject 15 Units into the skin 3 (three) times daily before meals.     . latanoprost (XALATAN) 0.005 % ophthalmic solution Place 1 drop into both eyes at bedtime. 2.5 mL 0  . metFORMIN (GLUCOPHAGE-XR) 500 MG 24 hr tablet Take 1 tablet by mouth 2 (two) times daily.  2  . metoprolol tartrate (LOPRESSOR) 25 MG tablet Take 1 tablet (25 mg total) by mouth 2 (two) times daily. 60 tablet 0  . nortriptyline (PAMELOR) 50 MG capsule     . NOVOLOG FLEXPEN 100 UNIT/ML FlexPen     . oxybutynin (DITROPAN-XL) 10 MG 24 hr tablet Take 10 mg by mouth at bedtime.  3  . oxyCODONE-acetaminophen (PERCOCET) 10-325 MG tablet Take 1 tablet by mouth every 6 (six) hours as needed for pain.    . pantoprazole (PROTONIX) 20 MG tablet Take 20 mg by mouth daily.    . traZODone (DESYREL) 50 MG tablet Take 1 tablet (50 mg total) by mouth at bedtime. 30 tablet 0   No current facility-administered medications on file prior to visit.     There are no Patient Instructions on file for this visit. No follow-ups on file.   Georgiana SpinnerFallon E Brown, NP  This note was completed with Office managerDragon Dictation.  Any errors are purely unintentional.

## 2018-12-31 NOTE — Telephone Encounter (Signed)
Patient's mother called stating the patient's leg has doubled in size, blisters that are breaking open and severe pain. Per Sheppard Plumber NP the patient is to be seen today with an ABI. I called the mother back and gave her the information of bringing the patient in today at 3:00 pm. This is information was also given to Jenness to schedule the patient to be seen today.

## 2018-12-31 NOTE — Consult Note (Signed)
Name: [name] MRN: [id number] DOB: 12/31/1960    ADMISSION DATE:  12/31/2018 CONSULTATION DATE: 12/31/2018  REFERRING MD : Dr. Wyn Quakerew   CHIEF COMPLAINT: LLE Rest Pain and Ulceration   BRIEF PATIENT DESCRIPTION:  58 yo female admitted with LLE rest pain and ulceration s/p angiogram requiring overnight heparin and TPA gtts  SIGNIFICANT EVENTS  01/6-Pt admitted to ICU s/p LLE angiogram on heparin and TPA gtts  HISTORY OF PRESENT ILLNESS:   This is a 58 yo female with a PMH of PVD, HTN, Hyperlipidemia, Glaucoma, GERD, Type II Diabetes Mellitus, Asthma, Arthritis, Anxiety, and Acute Pyelonephritis.  She underwent left lower extremity angiogram due to PAD with ulceration on 12/17/2018. On 12/26/2018 she developed left lower extremity severe swelling, blisters, and severe pain.  Due to worsening symptoms she contacted the vascular surgery office on 01/6, and was instructed to go to the the office for further evaluation.  Upon arrival to vascular surgery office ABI study performed, which revealed no flow below the left common femoral and profunda femoris artery of the left lower extremity. She [facility] for left lower extremity angiogram.  Left lower extremity angiogram findings were normal common femoral artery and profunda femorous artery.  However, the SFA demonstrated a flush occlusion and essentially no reconstitution or distal runoff was seen.  Therefore, pt admitted to ICU post angiogram for heparin and TPA infusions overnight with plans for repeat angiogram on 01/01/2019.  PAST MEDICAL HISTORY :   has a past medical history of Acute pain of right shoulder (05/19/2016), Acute PN (pyelonephritis) (05/18/2016), Acute pyelonephritis (05/18/2016), Anxiety, Arthritis, Asthma, Diabetes mellitus without complication (HCC), GERD (gastroesophageal reflux disease), Glaucoma, Hyperlipemia, Hyperlipemia, Hypertension, and Peripheral vascular disease (HCC).  has a past surgical history  that includes Anterior cruciate ligament repair and Lower Extremity Angiography (Left, 12/17/2018). Prior to Admission medications   Medication Sig Start Date End Date Taking? Authorizing Provider  albuterol (PROAIR HFA) 108 (90 Base) MCG/ACT inhaler  03/11/17  Yes [provider]  atorvastatin (LIPITOR) 20 MG tablet Take 20 mg by mouth at bedtime. 04/16/16  Yes [provider]  cholecalciferol (VITAMIN D) 1000 units tablet Take 5,000 Units by mouth daily.   Yes [provider]  clopidogrel (PLAVIX) 75 MG tablet Take 1 tablet (75 mg total) by mouth daily. 12/17/18  Yes Annice Needyew, Jason S, MD  fluticasone Aleda Grana(FLONASE) 50 MCG/ACT nasal spray  09/19/17  Yes [provider]  furosemide (LASIX) 20 MG tablet Take 1 tablet (20 mg total) by mouth daily. 05/02/16  Yes Gouru, Deanna ArtisAruna, MD  gabapentin (NEURONTIN) 600 MG tablet Take 1,200 mg by mouth 3 (three) times daily.    Yes [provider]  insulin glargine (LANTUS) 100 UNIT/ML injection Inject 70 Units into the skin daily.   Yes [provider]  insulin lispro (HUMALOG) 100 UNIT/ML injection Inject 15 Units into the skin 3 (three) times daily before meals.    Yes [provider]  latanoprost (XALATAN) 0.005 % ophthalmic solution Place 1 drop into both eyes at bedtime. 05/02/16  Yes Gouru, Deanna ArtisAruna, MD  metFORMIN (GLUCOPHAGE-XR) 500 MG 24 hr tablet Take 1 tablet by mouth 2 (two) times daily. 01/11/18  Yes [provider]  metoprolol tartrate (LOPRESSOR) 25 MG tablet Take 1 tablet (25 mg total) by mouth 2 (two) times daily. 05/02/16  Yes Gouru, Deanna ArtisAruna, MD  nortriptyline (PAMELOR) 50 MG capsule  11/13/18  Yes [provider]  NOVOLOG FLEXPEN 100 UNIT/ML FlexPen  11/06/18  Yes [provider]  oxybutynin (DITROPAN-XL) 10 MG 24 hr tablet Take 10 mg by mouth at bedtime. 02/22/16  Yes [provider]  pantoprazole (PROTONIX) 20 MG tablet Take 20 mg by mouth daily.   Yes [provider]  traZODone (DESYREL) 50 MG tablet Take 1 tablet (50 mg total) by mouth at bedtime. 05/02/16  Yes Gouru, Deanna Artis, MD  Ascorbic Acid (VITAMIN C) 1000 MG tablet Take 1,000 mg by mouth daily.    [provider]  aspirin EC 81 MG tablet Take 1 tablet (81 mg total) by mouth daily. Patient not taking: Reported on 12/31/2018 12/17/18   Annice Needy, MD  Calcium Carbonate-Vitamin D3 (CALCIUM 600-D) 600-400 MG-UNIT TABS Take by mouth 2 (two) times daily.    [provider]  DULoxetine (CYMBALTA) 20 MG capsule Take 60 mg by mouth daily.     [provider]  oxyCODONE-acetaminophen (PERCOCET) 10-325 MG tablet Take 1 tablet by mouth every 6 (six) hours as needed for pain.    [provider]   Allergies  Allergen Reactions  . Biaxin [Clarithromycin] Rash    Patient states this medication gives her severe rash and thrush in the mouth.    FAMILY HISTORY:  family history includes Diabetes in her mother; Heart disease in her mother; Stroke in her mother. SOCIAL HISTORY:  reports that she has been smoking. She has been smoking about 0.50 packs per day. She has never used smokeless tobacco. She reports that she does not drink alcohol or use drugs.  REVIEW OF SYSTEMS: Positives in BOLD  Constitutional: Negative for fever, chills, weight loss, malaise/fatigue and diaphoresis.  HENT: Negative for hearing loss, ear pain, nosebleeds, congestion, sore throat, neck pain, tinnitus and ear discharge.   Eyes: Negative for blurred vision, double vision, photophobia, pain, discharge and redness.  Respiratory: Negative for cough, hemoptysis, sputum production, shortness of breath, wheezing and stridor.   Cardiovascular: Negative for chest pain, palpitations, orthopnea, claudication, leg swelling and PND.  Gastrointestinal: Negative for heartburn, nausea, vomiting, abdominal pain, diarrhea, constipation, blood in stool and melena.  Genitourinary: Negative for dysuria, urgency,  frequency, hematuria and flank pain.  Musculoskeletal: left lower extremity pain and oozing blisters, myalgias, back pain, joint pain and falls.  Skin: Negative for itching and rash.  Neurological: Negative for dizziness, tingling, tremors, sensory change, speech change, focal weakness, seizures, loss of consciousness, weakness and headaches.  Endo/Heme/Allergies: Negative for environmental allergies and polydipsia. Does not bruise/bleed easily.  SUBJECTIVE:  c/o left lower extremity pain   VITAL SIGNS: Temp:  [98.9 F (37.2 C)-99.8 F (37.7 C)] 99.8 F (37.7 C) (01/06 2000) Pulse Rate:  [90-109] 109 (01/06 2000) Resp:  [14-23] 22 (01/06 2000) BP: (104-155)/(61-86) 104/61 (01/06 2000) SpO2:  [96 %-98 %] 97 % (01/06 2000) Weight:  [119 kg-119.7 kg] 114 kg (01/06 1843)  PHYSICAL EXAMINATION: General: well developed, well nourished female resting in bed  Neuro: alert and oriented, follows commands  HEENT: supple, no JVD  Cardiovascular: sinus tach, no R/G  Lungs: LUL inspiratory wheezing clear all other lobes, even, non labored  Abdomen: +BS x4, obese, soft, non tender, non distended  Musculoskeletal: 1+ RLE edema, 2+ LLE edema with open blisters, moves all extremities   Skin: LLE cool to touch   Recent Labs  Lab 12/31/18 1629 12/31/18 1908  NA  --  133*  K  --  2.8*  CL  --  91*  CO2  --  29  BUN 16 16  CREATININE 0.97 0.85  GLUCOSE  --  113*   Recent Labs  Lab 12/31/18 1908  HGB 9.4*  9.3*  HCT 30.6*  30.6*  WBC 27.2*  28.1*  PLT 532*  522*   No results found.  ASSESSMENT / PLAN:  LLE Rest Pain and Ulceration s/p Angiogram  Hx: HTN, PVD, and Hyperlipidemia Vascular Surgery primary-continue heparin and TPA gtts overnight with plans for repeat angiogram in the am  Trend CBC and fibrinogen  Monitor for s/sx of bleeding and transfuse for hgb <7 Continue prn dilaudid, toradol, and dilaudid for pain management  Continue outpatient metoprolol and lasix    Continuous telemetry monitoring   Type II Diabetes Mellitus CBG's ac/hs SSI Will hold scheduled lantus and novolog for now pt will be NPO overnight   Sonda Rumble, AGNP  Pulmonary/Critical Care Pager [phone] (please enter 7 digits) PCCM Consult Pager [phone] (please enter 7 digits)

## 2018-12-31 NOTE — H&P (Signed)
[facility] History & Physical Update  The patient was interviewed and re-examined.  The patient's previous History and Physical has been reviewed and is unchanged.  There is no change in the plan of care. We plan to proceed with the scheduled procedure.  Festus Barren, MD  12/31/2018, 5:03 PM

## 2019-01-01 ENCOUNTER — Ambulatory Visit: Payer: Medicare HMO

## 2019-01-01 ENCOUNTER — Inpatient Hospital Stay: Payer: Medicare HMO | Admitting: Certified Registered"

## 2019-01-01 ENCOUNTER — Encounter
Admission: AD | Disposition: A | Discharge status: Skilled Nursing Facility | Payer: Self-pay | Source: Home / Self Care | Attending: Vascular Surgery

## 2019-01-01 ENCOUNTER — Encounter: Payer: Self-pay | Admitting: Vascular Surgery

## 2019-01-01 DIAGNOSIS — L97929 Non-pressure chronic ulcer of unspecified part of left lower leg with unspecified severity: Secondary | ICD-10-CM

## 2019-01-01 DIAGNOSIS — I998 Other disorder of circulatory system: Secondary | ICD-10-CM

## 2019-01-01 DIAGNOSIS — I743 Embolism and thrombosis of arteries of the lower extremities: Secondary | ICD-10-CM

## 2019-01-01 DIAGNOSIS — I70249 Atherosclerosis of native arteries of left leg with ulceration of unspecified site: Secondary | ICD-10-CM

## 2019-01-01 DIAGNOSIS — M7989 Other specified soft tissue disorders: Secondary | ICD-10-CM

## 2019-01-01 DIAGNOSIS — L8990 Pressure ulcer of unspecified site, unspecified stage: Secondary | ICD-10-CM

## 2019-01-01 DIAGNOSIS — I70222 Atherosclerosis of native arteries of extremities with rest pain, left leg: Secondary | ICD-10-CM

## 2019-01-01 HISTORY — PX: FASCIOTOMY: SHX132

## 2019-01-01 HISTORY — PX: LOWER EXTREMITY ANGIOGRAPHY: CATH118251

## 2019-01-01 LAB — BASIC METABOLIC PANEL
Anion gap: 11 (ref 5–15)
Anion gap: 9 (ref 5–15)
BUN: 20 mg/dL (ref 6–20)
BUN: 22 mg/dL — ABNORMAL HIGH (ref 6–20)
CO2: 29 mmol/L (ref 22–32)
CO2: 26 mmol/L (ref 22–32)
Calcium: 6.3 mg/dL — CL (ref 8.9–10.3)
Calcium: 6.2 mg/dL — CL (ref 8.9–10.3)
Chloride: 93 mmol/L — ABNORMAL LOW (ref 98–111)
Chloride: 94 mmol/L — ABNORMAL LOW (ref 98–111)
Creatinine, Ser: 1.12 mg/dL — ABNORMAL HIGH (ref 0.44–1.00)
Creatinine, Ser: 1.31 mg/dL — ABNORMAL HIGH (ref 0.44–1.00)
GFR calc Af Amer: >60 mL/min (ref >60)
GFR calc Af Amer: 53 mL/min — ABNORMAL LOW (ref >60)
GFR calc non Af Amer: 55 mL/min — ABNORMAL LOW (ref >60)
GFR calc non Af Amer: 45 mL/min — ABNORMAL LOW (ref >60)
Glucose, Bld: 122 mg/dL — ABNORMAL HIGH (ref 70–99)
Glucose, Bld: 205 mg/dL — ABNORMAL HIGH (ref 70–99)
POTASSIUM: 3.7 mmol/L (ref 3.5–5.1)
Potassium: 3.3 mmol/L — ABNORMAL LOW (ref 3.5–5.1)
Sodium: 133 mmol/L — ABNORMAL LOW (ref 135–145)
Sodium: 129 mmol/L — ABNORMAL LOW (ref 135–145)

## 2019-01-01 LAB — FIBRINOGEN
Fibrinogen: >750 mg/dL — ABNORMAL HIGH (ref 210–475)
Fibrinogen: 731 mg/dL — ABNORMAL HIGH (ref 210–475)
Fibrinogen: 656 mg/dL — ABNORMAL HIGH (ref 210–475)

## 2019-01-01 LAB — CBC
HCT: 28 % — ABNORMAL LOW (ref 36.0–46.0)
HCT: 28.7 % — ABNORMAL LOW (ref 36.0–46.0)
HEMATOCRIT: 27.6 % — AB (ref 36.0–46.0)
Hemoglobin: 8.2 g/dL — ABNORMAL LOW (ref 12.0–15.0)
Hemoglobin: 8.3 g/dL — ABNORMAL LOW (ref 12.0–15.0)
Hemoglobin: 8.6 g/dL — ABNORMAL LOW (ref 12.0–15.0)
MCH: 24.9 pg — ABNORMAL LOW (ref 26.0–34.0)
MCH: 25.1 pg — ABNORMAL LOW (ref 26.0–34.0)
MCH: 25.2 pg — AB (ref 26.0–34.0)
MCHC: 29.6 g/dL — ABNORMAL LOW (ref 30.0–36.0)
MCHC: 29.7 g/dL — ABNORMAL LOW (ref 30.0–36.0)
MCHC: 30 g/dL (ref 30.0–36.0)
MCV: 83.7 fL (ref 80.0–100.0)
MCV: 83.8 fL (ref 80.0–100.0)
MCV: 84.7 fL (ref 80.0–100.0)
Platelets: 380 10*3/uL (ref 150–400)
Platelets: 401 10*3/uL — ABNORMAL HIGH (ref 150–400)
Platelets: 469 10*3/uL — ABNORMAL HIGH (ref 150–400)
RBC: 3.26 MIL/uL — ABNORMAL LOW (ref 3.87–5.11)
RBC: 3.34 MIL/uL — ABNORMAL LOW (ref 3.87–5.11)
RBC: 3.43 MIL/uL — ABNORMAL LOW (ref 3.87–5.11)
RDW: 15.8 % — ABNORMAL HIGH (ref 11.5–15.5)
RDW: 15.8 % — ABNORMAL HIGH (ref 11.5–15.5)
RDW: 15.9 % — ABNORMAL HIGH (ref 11.5–15.5)
WBC: 17.5 10*3/uL — ABNORMAL HIGH (ref 4.0–10.5)
WBC: 17.7 10*3/uL — ABNORMAL HIGH (ref 4.0–10.5)
WBC: 21.4 10*3/uL — ABNORMAL HIGH (ref 4.0–10.5)
nRBC: 0 % (ref 0.0–0.2)
nRBC: 0 % (ref 0.0–0.2)
nRBC: 0 % (ref 0.0–0.2)

## 2019-01-01 LAB — GLUCOSE, CAPILLARY
Glucose-Capillary: 98 mg/dL (ref 70–99)
Glucose-Capillary: 100 mg/dL — ABNORMAL HIGH (ref 70–99)
Glucose-Capillary: 96 mg/dL (ref 70–99)
Glucose-Capillary: 219 mg/dL — ABNORMAL HIGH (ref 70–99)
Glucose-Capillary: 225 mg/dL — ABNORMAL HIGH (ref 70–99)

## 2019-01-01 LAB — MAGNESIUM
Magnesium: 1.5 mg/dL — ABNORMAL LOW (ref 1.7–2.4)
Magnesium: 2 mg/dL (ref 1.7–2.4)

## 2019-01-01 LAB — MRSA PCR SCREENING: MRSA by PCR: NEGATIVE

## 2019-01-01 SURGERY — LOWER EXTREMITY ANGIOGRAPHY
Anesthesia: Moderate Sedation | Laterality: Left

## 2019-01-01 SURGERY — FASCIOTOMY, UPPER EXTREMITY
Anesthesia: General | Laterality: Left

## 2019-01-01 MED ORDER — FENTANYL CITRATE (PF) 100 MCG/2ML IJ SOLN
INTRAMUSCULAR | Status: AC
  Filled 2019-01-01: qty 2

## 2019-01-01 MED ORDER — MIDAZOLAM HCL 2 MG/2ML IJ SOLN
INTRAMUSCULAR | Status: DC | PRN
  Administered 2019-01-01: 2 mg via INTRAVENOUS
  Administered 2019-01-01: 1 mg via INTRAVENOUS
  Administered 2019-01-01 (×2): 2 mg via INTRAVENOUS

## 2019-01-01 MED ORDER — HEPARIN (PORCINE) IN NACL 1000-0.9 UT/500ML-% IV SOLN
INTRAVENOUS | Status: AC
  Filled 2019-01-01: qty 1000

## 2019-01-01 MED ORDER — INSULIN GLARGINE 100 UNIT/ML ~~LOC~~ SOLN
35.0000 [IU] | Freq: Every day | SUBCUTANEOUS | Status: DC
  Administered 2019-01-01: 35 [IU] via SUBCUTANEOUS
  Filled 2019-01-01 (×2): qty 0.35

## 2019-01-01 MED ORDER — LIDOCAINE-EPINEPHRINE (PF) 1 %-1:200000 IJ SOLN
INTRAMUSCULAR | Status: AC
  Filled 2019-01-01: qty 10

## 2019-01-01 MED ORDER — FENTANYL CITRATE (PF) 100 MCG/2ML IJ SOLN
INTRAMUSCULAR | Status: DC | PRN
  Administered 2019-01-01 (×3): 50 ug via INTRAVENOUS

## 2019-01-01 MED ORDER — FENTANYL CITRATE (PF) 100 MCG/2ML IJ SOLN
INTRAMUSCULAR | Status: DC | PRN
  Administered 2019-01-01 (×4): 50 ug via INTRAVENOUS

## 2019-01-01 MED ORDER — MAGNESIUM SULFATE 4 GM/100ML IV SOLN
4.0000 g | Freq: Once | INTRAVENOUS | Status: AC
  Administered 2019-01-01: 4 g via INTRAVENOUS
  Filled 2019-01-01: qty 100

## 2019-01-01 MED ORDER — MIDAZOLAM HCL 2 MG/2ML IJ SOLN
INTRAMUSCULAR | Status: DC | PRN
  Administered 2019-01-01: 2 mg via INTRAVENOUS

## 2019-01-01 MED ORDER — MIDAZOLAM HCL 5 MG/5ML IJ SOLN
INTRAMUSCULAR | Status: AC
  Filled 2019-01-01: qty 5

## 2019-01-01 MED ORDER — CALCIUM GLUCONATE-NACL 1-0.675 GM/50ML-% IV SOLN
1.0000 g | Freq: Once | INTRAVENOUS | Status: AC
  Administered 2019-01-01: 1000 mg via INTRAVENOUS
  Filled 2019-01-01: qty 50

## 2019-01-01 MED ORDER — MIDAZOLAM HCL 2 MG/2ML IJ SOLN
INTRAMUSCULAR | Status: AC
  Filled 2019-01-01: qty 2

## 2019-01-01 MED ORDER — HEPARIN SODIUM (PORCINE) 1000 UNIT/ML IJ SOLN
INTRAMUSCULAR | Status: AC
  Filled 2019-01-01: qty 1

## 2019-01-01 MED ORDER — HEPARIN (PORCINE) 25000 UT/250ML-% IV SOLN
600.0000 [IU]/h | INTRAVENOUS | Status: DC
  Administered 2019-01-01: 600 [IU]/h via INTRAVENOUS
  Filled 2019-01-01: qty 250

## 2019-01-01 MED ORDER — ONDANSETRON HCL 4 MG/2ML IJ SOLN: 4.0000 mg | Freq: Once | INTRAMUSCULAR | Status: DC | PRN

## 2019-01-01 MED ORDER — PROPOFOL 10 MG/ML IV BOLUS
INTRAVENOUS | Status: DC | PRN
  Administered 2019-01-01: 100 mg via INTRAVENOUS

## 2019-01-01 MED ORDER — MORPHINE SULFATE (PF) 2 MG/ML IV SOLN
2.0000 mg | INTRAVENOUS | Status: DC | PRN
  Administered 2019-01-01 – 2019-01-08 (×27): 2 mg via INTRAVENOUS
  Filled 2019-01-01 (×31): qty 1

## 2019-01-01 MED ORDER — SODIUM CHLORIDE 0.9 % IV SOLN: 4.0000 g | Freq: Once | INTRAVENOUS | Status: DC

## 2019-01-01 MED ORDER — POTASSIUM CHLORIDE 10 MEQ/50ML IV SOLN
10.0000 meq | INTRAVENOUS | Status: AC
  Administered 2019-01-01 (×2): 10 meq via INTRAVENOUS
  Filled 2019-01-01 (×2): qty 50

## 2019-01-01 MED ORDER — SODIUM CHLORIDE FLUSH 0.9 % IV SOLN
INTRAVENOUS | Status: AC
  Filled 2019-01-01: qty 10

## 2019-01-01 MED ORDER — FENTANYL CITRATE (PF) 100 MCG/2ML IJ SOLN: 25.0000 ug | INTRAMUSCULAR | Status: DC | PRN

## 2019-01-01 MED ORDER — OXYCODONE HCL 5 MG PO TABS
5.0000 mg | ORAL_TABLET | ORAL | Status: DC | PRN
  Administered 2019-01-01 – 2019-01-18 (×52): 10 mg via ORAL
  Filled 2019-01-01 (×53): qty 2

## 2019-01-01 MED ORDER — CEFAZOLIN SODIUM-DEXTROSE 2-4 GM/100ML-% IV SOLN
2.0000 g | INTRAVENOUS | Status: AC
  Administered 2019-01-01: 2 g via INTRAVENOUS
  Filled 2019-01-01: qty 100

## 2019-01-01 MED ORDER — HEPARIN SODIUM (PORCINE) 1000 UNIT/ML IJ SOLN
INTRAMUSCULAR | Status: DC | PRN
  Administered 2019-01-01: 3000 [IU] via INTRAVENOUS

## 2019-01-01 MED ORDER — IOPAMIDOL (ISOVUE-300) INJECTION 61%
INTRAVENOUS | Status: DC | PRN
  Administered 2019-01-01: 50 mL via INTRA_ARTERIAL

## 2019-01-01 SURGICAL SUPPLY — 40 items
BANDAGE ELASTIC 6 LF NS (GAUZE/BANDAGES/DRESSINGS) ×1 IMPLANT
BLADE SURG SZ10 CARB STEEL (BLADE) ×3 IMPLANT
BNDG COHESIVE 4X5 TAN STRL (GAUZE/BANDAGES/DRESSINGS) ×3 IMPLANT
BNDG GAUZE 4.5X4.1 6PLY STRL (MISCELLANEOUS) ×2 IMPLANT
CANISTER SUCT 1200ML W/VALVE (MISCELLANEOUS) ×3 IMPLANT
CHLORAPREP W/TINT 26ML (MISCELLANEOUS) ×5 IMPLANT
CONNECTOR Y WND VAC (MISCELLANEOUS) IMPLANT
COVER WAND RF STERILE (DRAPES) ×1 IMPLANT
DRAPE INCISE IOBAN 66X45 STRL (DRAPES) ×2 IMPLANT
DRESSING SURGICEL FIBRLLR 1X2 (HEMOSTASIS) ×1 IMPLANT
DRSG GAUZE FLUFF 36X18 (GAUZE/BANDAGES/DRESSINGS) ×1 IMPLANT
DRSG SURGICEL FIBRILLAR 1X2 (HEMOSTASIS)
DRSG VAC ATS MED SENSATRAC (GAUZE/BANDAGES/DRESSINGS) ×3 IMPLANT
ELECT CAUTERY BLADE 6.4 (BLADE) ×3 IMPLANT
ELECT REM PT RETURN 9FT ADLT (ELECTROSURGICAL) ×3
ELECTRODE REM PT RTRN 9FT ADLT (ELECTROSURGICAL) ×1 IMPLANT
GAUZE PETRO XEROFOAM 1X8 (MISCELLANEOUS) ×1 IMPLANT
GLOVE SURG SYN 8.0 (GLOVE) ×12 IMPLANT
GLOVE SURG SYN 8.0 PF PI (GLOVE) ×1 IMPLANT
GOWN STRL REUS W/ TWL LRG LVL3 (GOWN DISPOSABLE) ×1 IMPLANT
GOWN STRL REUS W/ TWL XL LVL3 (GOWN DISPOSABLE) ×1 IMPLANT
GOWN STRL REUS W/TWL LRG LVL3 (GOWN DISPOSABLE) ×4
GOWN STRL REUS W/TWL XL LVL3 (GOWN DISPOSABLE) ×2
HANDLE YANKAUER SUCT BULB TIP (MISCELLANEOUS) ×3 IMPLANT
KIT TURNOVER KIT A (KITS) ×3 IMPLANT
NS IRRIG 500ML POUR BTL (IV SOLUTION) ×3 IMPLANT
PACK EXTREMITY ARMC (MISCELLANEOUS) ×3 IMPLANT
PAD PREP 24X41 OB/GYN DISP (PERSONAL CARE ITEMS) ×3 IMPLANT
SPONGE LAP 18X18 RF (DISPOSABLE) ×4 IMPLANT
STAPLER SKIN PROX 35W (STAPLE) ×1 IMPLANT
STOCKINETTE M/LG 89821 (MISCELLANEOUS) ×3 IMPLANT
SUT ETHIBOND 0 36 GRN (SUTURE) ×1 IMPLANT
SUT SILK 2 0 (SUTURE) ×2
SUT SILK 2-0 18XBRD TIE 12 (SUTURE) ×1 IMPLANT
SUT VIC AB 0 CT1 36 (SUTURE) ×5 IMPLANT
SUT VIC AB 3-0 SH 27 (SUTURE)
SUT VIC AB 3-0 SH 27X BRD (SUTURE) ×4 IMPLANT
SUT VICRYL PLUS ABS 0 54 (SUTURE) ×1 IMPLANT
WND VAC CANISTER 500ML (MISCELLANEOUS) ×3 IMPLANT
WND VAC CONN Y (MISCELLANEOUS) ×2

## 2019-01-01 SURGICAL SUPPLY — 11 items
BALLN ULTRVRSE 3X300X150 (BALLOONS) ×2
BALLN ULTRVRSE 3X300X150 OTW (BALLOONS) ×1
BALLOON ULTRVRSE 3X300X150 OTW (BALLOONS) IMPLANT
CANISTER PENUMBRA ENGINE (MISCELLANEOUS) ×2 IMPLANT
CATH BEACON 5 .038 100 VERT TP (CATHETERS) ×2 IMPLANT
CATH INDIGO CAT6 KIT (CATHETERS) ×2 IMPLANT
DEVICE PRESTO INFLATION (MISCELLANEOUS) ×2 IMPLANT
DEVICE STARCLOSE SE CLOSURE (Vascular Products) ×2 IMPLANT
PACK ANGIOGRAPHY (CUSTOM PROCEDURE TRAY) ×3 IMPLANT
WIRE G V18X300CM (WIRE) ×4 IMPLANT
WIRE J 3MM .035X145CM (WIRE) ×2 IMPLANT

## 2019-01-01 NOTE — Op Note (Signed)
[facility]  Percutaneous Study/Intervention Procedural Note   Date of Surgery: 01/01/2019  Surgeon(s):Verle Wheeling    Assistants:none  Pre-operative Diagnosis: PAD with rest pain and ulceration left leg  Post-operative diagnosis:  Same  Procedure(s) Performed:             1.   Angiogram through existing catheter left lower extremity             2.  Catheter placement into left posterior tibial artery from right femoral approach             3.   Mechanical thrombectomy to the left SFA, popliteal artery, tibioperoneal trunk, and posterior tibial arteries with the penumbra cat 6 device             4.  Percutaneous transluminal angioplasty of left posterior tibial artery and tibioperoneal trunk with 3 mm diameter by 30 cm length angioplasty balloon             5.  StarClose closure device right femoral artery  EBL: 450 cc  Contrast: 50 cc  Fluoro Time: 4.9 minutes  Moderate Conscious Sedation Time: approximately 35 minutes using 7 mg of Versed and 200 Mcg of Fentanyl              Indications:  Patient is a 58 y.o.female with an ischemic left leg.  She has been on a TPA drip and a heparin drip overnight. The patient is brought in for angiography for further evaluation and potential treatment.  Due to the limb threatening nature of the situation, angiogram was performed for attempted limb salvage. The patient is aware that if the procedure fails, amputation would be expected.  The patient also understands that even with successful revascularization, amputation may still be required due to the severity of the situation. Risks and benefits are discussed and informed consent is obtained.   Procedure:  The patient was identified and appropriate procedural time out was performed.  The patient was then placed supine on the table and prepped and draped in the usual sterile fashion. Moderate conscious sedation was administered during a face to face encounter with the patient  throughout the procedure with my supervision of the RN administering medicines and monitoring the patient's vital signs, pulse oximetry, telemetry and mental status throughout from the start of the procedure until the patient was taken to the recovery room.  The existing lytic catheter was removed over a V 18 wire which was parked down into the tibial vessels.  Selective left lower extremity angiogram was then performed through the sheath. This demonstrated continued thrombosis of the left SFA, popliteal, and tibial arteries with essentially no flow seen initially.  At this point, the penumbra cat 6 device was brought onto the field and multiple passes were made throughout the left SFA, popliteal artery, tibioperoneal trunk, and down into the posterior tibial artery.  A large amount of thrombus was returned.  Imaging now showed clearance of the thrombus in the SFA and popliteal artery but still essentially no flow distally.  Another pass was made with the penumbra cat 6 device with little change.  I then performed angioplasty of the left posterior tibial artery from the ankle up through the tibioperoneal trunk with 2 inflations with a 3 mm diameter by 30 cm length angioplasty balloon inflated to 8 to 10 atm for 1 minute.  Imaging performed through the sheath still showed no flow distally.  I then put the catheter down into the posterior tibial  artery and imaging was performed which showed resolution of the thrombus but essentially no flow traversing distally.  There appeared to be less than 20% residual stenosis in the SFA, popliteal artery, tibioperoneal trunk, and posterior tibial artery but the high pressure from the left calf appeared to be inhibiting flow distally consistent with a compartment syndrome.  At this point, the patient will need to be taken to the operating room for fasciotomies.  I elected to terminate the procedure. The sheath was removed and StarClose closure device was deployed in the right  femoral artery with excellent hemostatic result. The patient was taken to the recovery room in stable condition having tolerated the procedure well.  Findings:                         Left lower Extremity:  Continued thrombosis of the left SFA, popliteal, and tibial arteries with essentially no flow seen initially   Disposition: Patient was taken to the recovery room in stable condition having tolerated the procedure well.  Complications: None  Leotis Pain 01/01/2019 8:28 AM   This note was created with Dragon Medical transcription system. Any errors in dictation are purely unintentional.

## 2019-01-01 NOTE — Op Note (Addendum)
[facility]   OPERATIVE NOTE  DATE: 01/01/2019  PRE-OPERATIVE DIAGNOSIS: Ischemic left leg with suspected compartment syndrome  POST-OPERATIVE DIAGNOSIS: same as above  PROCEDURE: 1.   Left leg/calf fasciotomies  SURGEON: Barbara Cower Terina Mcelhinny  ASSISTANT(S): Raul Del, PA-C  ANESTHESIA: general  ESTIMATED BLOOD LOSS: 15 cc  FINDING(S): 1.  Mild to moderate swelling particularly in the anterior compartment with some bulging on fascial release.  Muscles did all appear viable.   INDICATIONS:   [name] is a 58 y.o. female who presents with an ischemic leg.  She has undergone extensive revascularization but her angiogram shows minimal flow through the calf even after angioplasty and thrombectomy.  Her leg is markedly swollen and significant clinical concern is present for compartment syndrome.  As such, she is brought in for a fasciotomy and this clearly critical and limb threatening situation.  Risks and benefits were discussed and informed consent was obtained. An assistant was present during the procedure to help facilitate the exposure and expedite the procedure.  DESCRIPTION: After obtaining full informed written consent, the patient was brought back to the operating room and placed supine upon the operating table.  The patient was prepped and draped in the standard fashion. The assistant provided retraction and mobilization to help facilitate exposure and expedite the procedure throughout the entire procedure.  This included following suture, using retractors, and optimizing lighting. A 10 cm longitudinal skin incision was made over the lateral aspect of the leg 4-5 cm lateral and parallel to the tibia. The incision was then extended deep through subcutatneous tissues until fascia was exposed. Skin flaps were created on either side of the incision to expose the anterior and lateral compartments, after which the fascia overlying the anterior compartment was incised  and sharply extended proximally and distally along the length of the calf. The underlying muscles were released anterior compartment appeared to bulge through the fascial incisions.  The swelling was not dramatic, but it was noticeable particularly in the anterior compartment.  No hematoma was identified in the anterior or lateral compartments. Copious irrigation was then performed, and hemostasis was obtained with topical hemostatic agents as needed.  Similar was repeated for the superficial and deep posterior compartments with a 10-12 cm longitudinal incision made along the medial calf using a #15 blade scalpel, extended deep through subcutaneous tissues using electrocaudery until fascia was encountered, incised, and superficial and deep posterior fasciotomies were sharply extended the entire length of the calf.  There was less swelling on the superficial and deep posterior compartments, although it did swell some on fascial release. All instrument and sponge counts were confirmed, and KCI VAC negative pressure dressing sponges were selected, trimmed to the appropriate dimensions, and applied to the opened fasciotomy wounds with medially and laterally.  Good occlusive seal was obtained. At this point, we elected to complete the procedure.  The patient was taken to the recovery room in stable condition.   COMPLICATIONS: None  CONDITION: Stable  Festus Barren  01/01/2019, 1:31 PM    This note was created with Dragon Medical transcription system. Any errors in dictation are purely unintentional.

## 2019-01-01 NOTE — Anesthesia Post-op Follow-up Note (Signed)
Anesthesia QCDR form completed.        

## 2019-01-01 NOTE — Care Management Note (Signed)
Case Management Note  Patient Details  Name: [name] MRN: [id number] Date of Birth: 13-Apr-1961  Subjective/Objective:    Patient admitted for ischemic leg, vascular MD note reports " [name] is a 58 y.o. female who presents with an ischemic leg.  She has undergone extensive revascularization but her angiogram shows minimal flow through the calf even after angioplasty and thrombectomy.  Her leg is markedly swollen and significant clinical concern is present for compartment syndrome.  As such, she is brought in for a fasciotomy and this clearly critical and limb threatening situation."  RNCM completed initial assessment before patient went to the OR this am.  Patient is from home and lives by herself.  Patient reports that she is independent in ADL's and has RN that comes out 2 times per week from [facility].  [place] with [facility] notified of patient admission and will verify services.  Patient reports that she has all needed equipment at home, walker, cane, shower chair.  RNCM will cont to follow patient progression during hospitalization, patient is coming back from the OR with wound vac and may need at discharge.  Robbie Lis RN BSN [phone]           Action/Plan:   Expected Discharge Date:                  Expected Discharge Plan:  Home w Home Health Services  In-House Referral:     Discharge planning Services  CM Consult  Post Acute Care Choice:    Choice offered to:     DME Arranged:    DME Agency:     HH Arranged:    HH Agency:  [facility]  Status of Service:  In process, will continue to follow  If discussed at Long Length of Stay Meetings, dates discussed:    Additional Comments:  Allayne Butcher, RN 01/01/2019, 2:16 PM

## 2019-01-01 NOTE — Progress Notes (Signed)
Electrolyte replacement consult   1/7 AM K+ 3.3, Ca 6.3, Mg 1.5. Currently being replaced per CCM. Recheck electrolyte panel with tomorrow AM labs.   Fulton Reek, PharmD, BCPS  01/01/19 6:16 AM

## 2019-01-01 NOTE — Anesthesia Preprocedure Evaluation (Signed)
Anesthesia Evaluation  Patient identified by MRN, date of birth, ID band Patient awake    Reviewed: Allergy & Precautions, H&P , NPO status , Patient's Chart, lab work & pertinent test results, reviewed documented beta blocker date and time   Airway Mallampati: II  TM Distance: >3 FB Neck ROM: full    Dental  (+) Teeth Intact   Pulmonary neg pulmonary ROS, asthma , Current Smoker,    Pulmonary exam normal        Cardiovascular Exercise Tolerance: Poor hypertension, On Medications + Peripheral Vascular Disease  negative cardio ROS Normal cardiovascular exam Rate:Normal     Neuro/Psych Anxiety  Neuromuscular disease negative psych ROS   GI/Hepatic negative GI ROS, Neg liver ROS, GERD  Medicated,  Endo/Other  negative endocrine ROSdiabetes, Poorly Controlled, Type 1, Insulin Dependent  Renal/GU CRFRenal disease  negative genitourinary   Musculoskeletal   Abdominal   Peds  Hematology negative hematology ROS (+)   Anesthesia Other Findings   Reproductive/Obstetrics negative OB ROS                             Anesthesia Physical Anesthesia Plan  ASA: III and emergent  Anesthesia Plan: General LMA   Post-op Pain Management:    Induction:   PONV Risk Score and Plan:   Airway Management Planned:   Additional Equipment:   Intra-op Plan:   Post-operative Plan:   Informed Consent: I have reviewed the patients History and Physical, chart, labs and discussed the procedure including the risks, benefits and alternatives for the proposed anesthesia with the patient or authorized representative who has indicated his/her understanding and acceptance.     Plan Discussed with: CRNA  Anesthesia Plan Comments:         Anesthesia Quick Evaluation

## 2019-01-01 NOTE — Transfer of Care (Signed)
Immediate Anesthesia Transfer of Care Note  Patient: [name]  Procedure(s) Performed: FASCIOTOMY LEFT LEG (Left )  Patient Location: PACU  Anesthesia Type:General  Level of Consciousness: awake, alert  and oriented  Airway & Oxygen Therapy: Patient Spontanous Breathing and Patient connected to face mask oxygen  Post-op Assessment: Report given to RN and Post -op Vital signs reviewed and stable  Post vital signs: Reviewed and stable  Last Vitals:  Vitals Value Taken Time  BP    Temp 36.2 C 01/01/2019  1:41 PM  Pulse 101 01/01/2019  1:41 PM  Resp 18 01/01/2019  1:42 PM  SpO2 98 % 01/01/2019  1:41 PM  Vitals shown include unvalidated device data.  Last Pain:  Vitals:   01/01/19 0953  TempSrc: Axillary  PainSc: 0-No pain         Complications: No apparent anesthesia complications

## 2019-01-01 NOTE — Anesthesia Procedure Notes (Signed)
Procedure Name: LMA Insertion Date/Time: 01/01/2019 12:40 PM Performed by: Danelle BerryWarr, Crawford Tamura E, CRNA Pre-anesthesia Checklist: Patient identified, Emergency Drugs available, Suction available, Patient being monitored and Timeout performed Patient Re-evaluated:Patient Re-evaluated prior to induction Oxygen Delivery Method: Circle system utilized and Simple face mask Preoxygenation: Pre-oxygenation with 100% oxygen Induction Type: IV induction Ventilation: Mask ventilation without difficulty LMA: LMA inserted LMA Size: 4.0 Placement Confirmation: breath sounds checked- equal and bilateral and positive ETCO2 Dental Injury: Teeth and Oropharynx as per pre-operative assessment

## 2019-01-01 NOTE — OR Nursing (Signed)
Patient in pre-op holding for OR. Noted Heparin to be infusing at 600 units/hour. Dr. Wyn Quaker notified by Shon Hale Sillmon and Heparin drip discontinued per his request

## 2019-01-01 NOTE — Progress Notes (Signed)
Inpatient Diabetes Program Recommendations  AACE/ADA: New Consensus Statement on Inpatient Glycemic Control (2015)  Target Ranges:  Prepandial:   less than 140 mg/dL      Peak postprandial:   less than 180 mg/dL (1-2 hours)      Critically ill patients:  140 - 180 mg/dL   Lab Results  Component Value Date   GLUCAP 98 01/01/2019   HGBA1C 11.1 (H) 04/24/2016    Review of Glycemic ControlResults for [name], [name] (MRN [id number]) as of 01/01/2019 11:13  Ref. Range 12/31/2018 16:25 12/31/2018 18:43 12/31/2018 19:52 12/31/2018 22:17 01/01/2019 07:14  Glucose-Capillary Latest Ref Range: 70 - 99 mg/dL 707 (H) 867 (H) 544 (H) 181 (H) 98   Diabetes history: DM 2 Outpatient Diabetes medications:  Lantus 70 units daily, Humalog 15 units tid with meals, Metformin 500 mg bid Current orders for Inpatient glycemic control:  Novolog resistant tid with meals and HS Inpatient Diabetes Program Recommendations:   Referral received. Consider adding Lantus 35 units q HS (this is 1/2 of home dose).  Also please add A1C to current labs.   Thanks,  Beryl Meager, RN, BC-ADM Inpatient Diabetes Coordinator Pager [phone] (8a-5p)

## 2019-01-02 ENCOUNTER — Ambulatory Visit: Payer: Medicare HMO | Admitting: Internal Medicine

## 2019-01-02 ENCOUNTER — Encounter: Payer: Self-pay | Admitting: Vascular Surgery

## 2019-01-02 LAB — PHOSPHORUS: Phosphorus: 3.4 mg/dL (ref 2.5–4.6)

## 2019-01-02 LAB — BASIC METABOLIC PANEL
Anion gap: 8 (ref 5–15)
BUN: 24 mg/dL — ABNORMAL HIGH (ref 6–20)
CO2: 26 mmol/L (ref 22–32)
Calcium: 6.3 mg/dL — CL (ref 8.9–10.3)
Chloride: 96 mmol/L — ABNORMAL LOW (ref 98–111)
Creatinine, Ser: 1.16 mg/dL — ABNORMAL HIGH (ref 0.44–1.00)
GFR calc Af Amer: >60 mL/min (ref >60)
GFR calc non Af Amer: 53 mL/min — ABNORMAL LOW (ref >60)
Glucose, Bld: 224 mg/dL — ABNORMAL HIGH (ref 70–99)
POTASSIUM: 3.7 mmol/L (ref 3.5–5.1)
Sodium: 130 mmol/L — ABNORMAL LOW (ref 135–145)

## 2019-01-02 LAB — HEPARIN LEVEL (UNFRACTIONATED)
Heparin Unfractionated: <0.1 IU/mL — ABNORMAL LOW (ref 0.30–0.70)
Heparin Unfractionated: <0.1 IU/mL — ABNORMAL LOW (ref 0.30–0.70)

## 2019-01-02 LAB — GLUCOSE, CAPILLARY
Glucose-Capillary: 228 mg/dL — ABNORMAL HIGH (ref 70–99)
Glucose-Capillary: 236 mg/dL — ABNORMAL HIGH (ref 70–99)
Glucose-Capillary: 175 mg/dL — ABNORMAL HIGH (ref 70–99)
Glucose-Capillary: 96 mg/dL (ref 70–99)

## 2019-01-02 LAB — HEMOGLOBIN A1C
Hgb A1c MFr Bld: 7.6 % — ABNORMAL HIGH (ref 4.8–5.6)
Mean Plasma Glucose: 171.42 mg/dL

## 2019-01-02 LAB — ALBUMIN: Albumin: 1.2 g/dL — ABNORMAL LOW (ref 3.5–5.0)

## 2019-01-02 LAB — MAGNESIUM: Magnesium: 1.8 mg/dL (ref 1.7–2.4)

## 2019-01-02 LAB — HEPARIN INDUCED PLATELET AB (HIT ANTIBODY): Heparin Induced Plt Ab: 0.205 OD (ref 0.000–0.400)

## 2019-01-02 MED ORDER — OCUVITE-LUTEIN PO CAPS
1.0000 | ORAL_CAPSULE | Freq: Every day | ORAL | Status: DC
  Administered 2019-01-03 – 2019-01-18 (×14): 1 via ORAL
  Filled 2019-01-02 (×16): qty 1

## 2019-01-02 MED ORDER — VITAMIN C 500 MG PO TABS
250.0000 mg | ORAL_TABLET | Freq: Two times a day (BID) | ORAL | Status: DC
  Administered 2019-01-02 – 2019-01-03 (×2): 250 mg via ORAL
  Filled 2019-01-02: qty 0.5
  Filled 2019-01-02: qty 1
  Filled 2019-01-02: qty 0.5
  Filled 2019-01-02 (×3): qty 1

## 2019-01-02 MED ORDER — METFORMIN HCL ER 500 MG PO TB24
500.0000 mg | ORAL_TABLET | Freq: Two times a day (BID) | ORAL | Status: DC
  Administered 2019-01-02 – 2019-01-11 (×18): 500 mg via ORAL
  Filled 2019-01-02 (×19): qty 1

## 2019-01-02 MED ORDER — INSULIN GLARGINE 100 UNIT/ML ~~LOC~~ SOLN
40.0000 [IU] | Freq: Every day | SUBCUTANEOUS | Status: DC
  Filled 2019-01-02 (×2): qty 0.4

## 2019-01-02 MED ORDER — HEPARIN (PORCINE) 25000 UT/250ML-% IV SOLN
1950.0000 [IU]/h | INTRAVENOUS | Status: DC
  Administered 2019-01-02: 800 [IU]/h via INTRAVENOUS
  Administered 2019-01-03: 1400 [IU]/h via INTRAVENOUS
  Administered 2019-01-04 – 2019-01-07 (×6): 1850 [IU]/h via INTRAVENOUS
  Filled 2019-01-02 (×9): qty 250

## 2019-01-02 MED ORDER — INSULIN ASPART 100 UNIT/ML ~~LOC~~ SOLN
4.0000 [IU] | Freq: Three times a day (TID) | SUBCUTANEOUS | Status: DC
  Administered 2019-01-02 – 2019-01-16 (×37): 4 [IU] via SUBCUTANEOUS
  Filled 2019-01-02 (×36): qty 1

## 2019-01-02 NOTE — Progress Notes (Signed)
Initial Nutrition Assessment  DOCUMENTATION CODES:   Morbid obesity  INTERVENTION:  Provide Ocuvite daily for wound healing (provides zinc, vitamin A, vitamin C, Vitamin E, copper, and selenium).  Provide vitamin C 250 mg BID.  Encouraged ongoing adequate intake of protein at meals. Discussed importance of meeting calorie and protein needs for wound healing. Discussed holding off on any further intentional weight loss while wounds are healing.  NUTRITION DIAGNOSIS:   Increased nutrient needs related to wound healing as evidenced by estimated needs.  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   Met with patient at bedside. She reports her appetite has been good lately and she has been eating well. She is finishing 100% of her meals here and choosing adequate protein at meals to meet her needs. She reports that around Thanksgiving she had a virus and had N/V and decreased appetite/intake. After she began taking antiemetics at home her nausea and appetite improved. Abdomen soft. Last BM 12/31/2018 per chart. We also discussed importance of glycemic control with wound healing. Patient reports her glycemic control has been the best it has ever been lately. She is motivated to keep her blood sugar under control.  Patient reports her UBW was 280 lbs (127.3 kg) prior to getting sick around Thanksgiving. She is reporting a 30 lb weight loss since  then. Per chart she was 131 kg on 04/02/2018, 111.6 kg on 12/04/2018, 119.7 kg on 12/17/2018, and was 114 kg (251.32 lbs) on admission. She reports that some weight loss was unintentional but then once her appetite improved she continued to lose weight intentionally. Based on trends in chart cannot verify that patient has lost 30 lbs over the past few months. Likely occurred over a longer period of time.  Medications reviewed and include: Lasix 20 mg daily PO, gabapengin, Novolog 0-20 units TID (received 14 units past 24 hrs), Novolog 0-5 units QHS (received 2 units last night), Lantus 35 units QHS, pantoprazole 20 mg daily PO, NS @ 75 mL/hr.  Labs reviewed: CBG 96-228 past 24 hrs, Sodium 130, Chloride 96, BUN 24, Creatinine 1.16, Calcium 6.3 (corrects to 8.54 with albumin of 1.2), HgbA1c 7.6.  Patient does not meet criteria for malnutrition.  Discussed with RN and on rounds.  NUTRITION - FOCUSED PHYSICAL EXAM:    Most Recent Value  Orbital Region  No depletion  Upper Arm Region  No depletion  Thoracic and Lumbar Region  No depletion  Buccal Region  No depletion  Temple Region  No depletion  Clavicle Bone Region  No depletion  Clavicle and Acromion Bone Region  No depletion  Scapular Bone Region  No depletion  Dorsal Hand  No depletion  Patellar Region  Unable to assess  Anterior Thigh Region  Unable to assess  Posterior Calf Region  Unable to assess  Edema (RD Assessment)  Unable to assess  Hair  Reviewed  Eyes  Reviewed  Mouth  Reviewed  Skin  Reviewed  Nails  Reviewed     Diet Order:   Diet Order            Diet Carb Modified Fluid consistency: Thin; Room service appropriate? Yes  Diet effective now             EDUCATION NEEDS:   Education needs have been addressed  Skin:  Skin Assessment: Skin Integrity Issues:(wound left heel; fasciotomy wounds to both sides of left calf with wound VAC in place)  Last BM:  12/31/2018  Height:   Ht Readings from Last 1 Encounters:   12/31/18 5' 3"  (1.6 m)   Weight:   Wt Readings from Last 1 Encounters:  01/02/19 116 kg   Ideal Body Weight:  52.3 kg  BMI:  Body mass index is 45.3 kg/m.  Estimated Nutritional Needs:   Kcal:  2100-2400  Protein:  100-110 grams  Fluid:  2.1-2.4 L/day  Willey Blade, MS, RD, LDN Office: [phone] Pager: [phone] After Hours/Weekend Pager: [phone]

## 2019-01-02 NOTE — Consult Note (Signed)
PHARMACY CONSULT NOTE - FOLLOW UP  Pharmacy Consult for Electrolyte Monitoring and Replacement   Recent Labs: Potassium (mmol/L)  Date Value  01/02/2019 3.7   Magnesium (mg/dL)  Date Value  35/32/9924 1.8   Calcium (mg/dL)  Date Value  26/83/4196 6.3 (LL)   Albumin (g/dL)  Date Value  22/29/7989 1.2 (L)   Phosphorus (mg/dL)  Date Value  21/19/4174 3.4   Sodium (mmol/L)  Date Value  01/02/2019 130 (L)   Correct Calcium: 8.24  Assessment: Pharmacy consulted for electrolyte monitoring and replacement in 58 yo female admitted with LLE pain and ulceration. Patient had hypomagnesemia and hypokalemia on admission.   Goal of Therapy:  Electrolytes WNL   Plan:  1/8 Phos: 3.4, Mg: 1.8, K: 3.7 and correct Calcium: 8.24.   No electrolyte replacement needed at this time.   Will F/U up with AM labs on 1/9 and continue to replace as needed.   Gardner Candle, PharmD, BCPS Clinical Pharmacist 01/02/2019 8:52 AM

## 2019-01-02 NOTE — Evaluation (Signed)
Physical Therapy Evaluation Patient Details Name: [name] MRN: [id number] DOB: Jul 30, 1961 Today's Date: 01/02/2019   History of Present Illness  Pt is a 58 yo female with PMHx including PVD, anxiety, GERD, DM, HTN, CRF s/p extensive LLE revascularization during this hospitalization. Angiogram shows minimal flow through the calf even after angioplasty and thrombectomy. Concern for compartment syndrome and pt now s/p LLE fasciotomies (01/01/2019).     Clinical Impression  Pt presents with deficits in strength, transfers, mobility, gait, balance, and activity tolerance.  Pt required extra time and effort during sup to/from sit but no physical assistance and was very motivated to complete functional tasks with as little assistance as possible.  Pt unable to stand fully this session but was able to laterally scoot to the right at the EOB with significant effort.  Pt was working on ambulating with OPPT as recently as Nov of 2019 but per patient her vascular MD at the time recommended to limit L foot WB to transfers only secondary to the wound on her L heel.  Verbal orders this date from Dr. Wyn Quaker for WBAT to the left foot.  Pt reports feeling weaker than baseline and will  benefit from PT services in a SNF setting upon discharge to safely address above deficits for decreased caregiver assistance and eventual return to PLOF.      Follow Up Recommendations SNF    Equipment Recommendations  None recommended by PT    Recommendations for Other Services       Precautions / Restrictions Precautions Precautions: Fall Precaution Comments: w/c level at baseline Restrictions Weight Bearing Restrictions: Yes LLE Weight Bearing: Weight bearing as tolerated Other Position/Activity Restrictions: LLE WBAT per verbal order from Dr. Wyn Quaker      Mobility  Bed Mobility Overal bed mobility: Needs Assistance Bed Mobility: Supine to Sit;Sit to Supine     Supine to sit: +2 for safety/equipment;Supervision;HOB  elevated Sit to supine: Supervision;Min guard;+2 for safety/equipment   General bed mobility comments: Increased effort/time to complete, pt eager to perform without assist  Transfers Overall transfer level: Needs assistance   Transfers: Lateral/Scoot Transfers          Lateral/Scoot Transfers: Supervision General transfer comment: Pt unable to clear EOB completely but able to laterally scoot around 1' to the right at the EOB  Ambulation/Gait             General Gait Details: Unable  Stairs            Wheelchair Mobility    Modified Rankin (Stroke Patients Only)       Balance Overall balance assessment: Needs assistance Sitting-balance support: Single extremity supported;Feet supported Sitting balance-Leahy Scale: Good Sitting balance - Comments: fair-, occasionally resting UE on pillows                                     Pertinent Vitals/Pain Pain Assessment: No/denies pain    Home Living Family/patient expects to be discharged to:: Private residence Living Arrangements: Alone Available Help at Discharge: Family;Available PRN/intermittently Type of Home: House Home Access: Ramped entrance     Home Layout: One level Home Equipment: Walker - 2 wheels;Cane - single point;Wheelchair - manual;Bedside commode      Prior Function Level of Independence: Independent with assistive device(s)         Comments: Pt mod indep at w/c level for mobility, transferring on her own; mod indep for  ADL and 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); has RN 2x/wk for L nonhealing wound on foot; lady cleans the house 1x/mo; no falls in the last year     Hand Dominance        Extremity/Trunk Assessment   Upper Extremity Assessment Upper Extremity Assessment: Defer to OT evaluation    Lower Extremity Assessment Lower Extremity Assessment: Generalized weakness;LLE deficits/detail LLE Deficits / Details: 2 fasciotomies noted, L heel wound, LLE  seeping/edematous, pt able to move LLE independently with increased effort LLE: (Unable to fully assess secondary to extensive recent surgeries)       Communication   Communication: No difficulties  Cognition Arousal/Alertness: Awake/alert Behavior During Therapy: WFL for tasks assessed/performed Overall Cognitive Status: Within Functional Limits for tasks assessed                                 General Comments: initial difficulty with orientation to day of week/month but with additional time, pt identified Jan and 2020. Follows all commands.       General Comments General comments (skin integrity, edema, etc.): wound vac, catheter, all else instact and in place at start and end of session; LLE noted to be mildly edematous and seeping in spots - pt aware and reports nursing aware; O2 sats on RA >98% t/o session, BP at rest 105/48 increasing to 111/66 after bed mobility sitting EOB, HR 94-104.     Exercises Total Joint Exercises Ankle Circles/Pumps: AROM;Right;5 reps;10 reps(Unable to perform on the L foot, chronic per pt) Quad Sets: Strengthening;Both;10 reps Gluteal Sets: Strengthening;Both;10 reps Hip ABduction/ADduction: AROM;Both;5 reps Other Exercises Other Exercises: HEP education and review for LLE APs, BLE QS, GS, LAQ, knee flex, hip abd x 5-10 each 4-5x/day as tolerated   Assessment/Plan    PT Assessment Patient needs continued PT services  PT Problem List Decreased strength;Decreased activity tolerance;Decreased balance;Decreased mobility;Decreased knowledge of use of DME       PT Treatment Interventions DME instruction;Functional mobility training;Therapeutic activities;Therapeutic exercise;Balance training;Gait training    PT Goals (Current goals can be found in the Care Plan section)  Acute Rehab PT Goals Patient Stated Goal: to get my leg healed and go back home    Frequency Min 2X/week   Barriers to discharge Decreased caregiver support       Co-evaluation PT/OT/SLP Co-Evaluation/Treatment: Yes Reason for Co-Treatment: Complexity of the patient's impairments (multi-system involvement);For patient/therapist safety;To address functional/ADL transfers PT goals addressed during session: Mobility/safety with mobility;Balance;Strengthening/ROM OT goals addressed during session: ADL's and self-care       AM-PAC PT "6 Clicks" Mobility  Outcome Measure Help needed turning from your back to your side while in a flat bed without using bedrails?: A Little Help needed moving from lying on your back to sitting on the side of a flat bed without using bedrails?: A Little Help needed moving to and from a bed to a chair (including a wheelchair)?: A Lot Help needed standing up from a chair using your arms (e.g., wheelchair or bedside chair)?: A Lot Help needed to walk in hospital room?: Total Help needed climbing 3-5 steps with a railing? : Total 6 Click Score: 12    End of Session   Activity Tolerance: Patient tolerated treatment well Patient left: in bed;with call bell/phone within reach;with nursing/sitter in room Nurse Communication: Mobility status PT Visit Diagnosis: Muscle weakness (generalized) (M62.81)    Time: [id number] PT Time Calculation (min) (ACUTE  ONLY): 51 min   Charges:   PT Evaluation $PT Eval Moderate Complexity: 1 Mod PT Treatments $Therapeutic Exercise: 8-22 mins        D. Elly Modena PT, DPT 01/02/19, 4:07 PM

## 2019-01-02 NOTE — Progress Notes (Signed)
[facility]  Daily Progress Note   Subjective  - 1 Day Post-Op  Pain in her foot is less than it was, but still present.  Foot remains cool and purple.  She is warm down to just above the ankle.  Vacs with a good seal.  Heparin had to be discontinued from bleeding from the fasciotomy sites last night, but we will try to resume that today.  She is hungry and wants to smoke.  Objective Vitals:   01/02/19 1400 01/02/19 1500 01/02/19 1527 01/02/19 1600  BP: 108/60 (!) 104/57  115/60  Pulse: 93 95 94 96  Resp: 20 16 (!) 21 19  Temp:      TempSrc:      SpO2: 100% 96% 97% 100%  Weight:      Height:        Intake/Output Summary (Last 24 hours) at 01/02/2019 1628 Last data filed at 01/02/2019 1428 Gross per 24 hour  Intake 2800.86 ml  Output 1135 ml  Net 1665.86 ml    PULM  CTAB CV  RRR VASC  left foot is cool mottled.  She is warm down to just above the ankle.  No pedal pulses appreciated  Laboratory CBC    Component Value Date/Time   WBC 17.7 (H) 01/01/2019 1520   HGB 8.2 (L) 01/01/2019 1520   HCT 27.6 (L) 01/01/2019 1520   PLT 380 01/01/2019 1520    BMET    Component Value Date/Time   NA 130 (L) 01/02/2019 0543   K 3.7 01/02/2019 0543   CL 96 (L) 01/02/2019 0543   CO2 26 01/02/2019 0543   GLUCOSE 224 (H) 01/02/2019 0543   BUN 24 (H) 01/02/2019 0543   CREATININE 1.16 (H) 01/02/2019 0543   CALCIUM 6.3 (LL) 01/02/2019 0543   GFRNONAA 53 (L) 01/02/2019 0543   GFRAA >60 01/02/2019 0543    Assessment/Planning: POD #1/2 s/p left lower extremity revascularization attempts and postoperative day #1 status post fasciotomy   Her foot appears as if it will likely be unsalvageable, but her pain is less.  We are going to try to resume the heparin to see if that has any improvement.  She understands that a major amputation is certainly possible and is probably the most likely option at this point.  She is stable and can go to the floor  today.    Festus Barren  01/02/2019, 4:28 PM

## 2019-01-02 NOTE — Consult Note (Addendum)
ANTICOAGULATION CONSULT NOTE - Initial Consult  Pharmacy Consult for Heparin Drip  Indication: Ischemic left leg   Allergies  Allergen Reactions  . Biaxin [Clarithromycin] Rash    Patient states this medication gives her severe rash and thrush in the mouth.    Patient Measurements: Height: 5\' 3"  (160 cm) Weight: 255 lb 11.7 oz (116 kg) IBW/kg (Calculated) : 52.4 Heparin Dosing Weight: 80 kg  Vital Signs: Temp: 97.6 F (36.4 C) (01/08 1250) Temp Source: Oral (01/08 1250) BP: 104/57 (01/08 1500) Pulse Rate: 94 (01/08 1527)  Labs: Recent Labs    12/31/18 1908 01/01/19 0026 01/01/19 0451 01/01/19 1520 01/01/19 1813 01/02/19 0543  HGB 9.4*  9.3* 8.6* 8.3* 8.2*  --   --   HCT 30.6*  30.6* 28.7* 28.0* 27.6*  --   --   PLT 532*  522* 469* 401* 380  --   --   APTT 51*  --   --   --   --   --   LABPROT 15.9*  --   --   --   --   --   INR 1.28  --   --   --   --   --   HEPARINUNFRC  --   --   --   --   --  <0.10*  CREATININE 0.85  --  1.12*  --  1.31* 1.16*    Estimated Creatinine Clearance: 66.5 mL/min (A) (by C-G formula based on SCr of 1.16 mg/dL (H)).   Medical History: Past Medical History:  Diagnosis Date  . Acute pain of right shoulder 05/19/2016  . Acute PN (pyelonephritis) 05/18/2016  . Acute pyelonephritis 05/18/2016  . Anxiety   . Arthritis   . Asthma   . Diabetes mellitus without complication (HCC)   . GERD (gastroesophageal reflux disease)   . Glaucoma   . Hyperlipemia   . Hyperlipemia   . Hypertension   . Peripheral vascular disease [facility])     Assessment: Pharmacy consulted for heparin drip dosing and monitoring in 58 yo female with ischemic left leg. Patient S/P angioplasty,  mechanical thrombectomy and fasciotomy. Patient has clots in distal arteries of lower extremity.     Goal of Therapy:  Heparin level 0.3-0.7 units/ml Monitor platelets by anticoagulation protocol: Yes   Plan:  Heparin drip stopped 1/7 due to bleeding Per Dr Wyn Quaker, No  Bolus. Start heparin infusion at 800 units/hr Check anti-Xa level in 6 hours and daily while on heparin Continue to monitor H&H and platelets   Gardner Candle, PharmD, BCPS Clinical Pharmacist 01/02/2019 3:34 PM

## 2019-01-02 NOTE — Progress Notes (Signed)
Inpatient Diabetes Program Recommendations  AACE/ADA: New Consensus Statement on Inpatient Glycemic Control  Target Ranges:  Prepandial:   less than 140 mg/dL      Peak postprandial:   less than 180 mg/dL (1-2 hours)      Critically ill patients:  140 - 180 mg/dL   Results for AMORE, HOFLAND (MRN [id number]) as of 01/02/2019 08:05  Ref. Range 01/01/2019 07:14 01/01/2019 11:40 01/01/2019 13:42 01/01/2019 16:28 01/01/2019 21:54 01/02/2019 07:30  Glucose-Capillary Latest Ref Range: 70 - 99 mg/dL 98 409 (H) 96 811 (H) 914 (H) 228 (H)   Review of Glycemic Control  Diabetes history: DM2 Outpatient Diabetes medications: Lantus 70 units daily, Humalog 15 units TID with meals, Metformin 500 mg BID Current orders for Inpatient glycemic control: Lantus 35 units QHS, Novolog 0-20 units TID with meals, Novolog 0-5 units QHS  Inpatient Diabetes Program Recommendations:  Insulin - Basal: Please consider increasing Lantus 40 units QHS. Insulin - Meal Coverage: Please consider ordering Novolog 4 units TID with meals if patient eats at least 50% of meals.  Thanks, Orlando Penner, RN, MSN, CDE Diabetes Coordinator Inpatient Diabetes Program [phone] (Team Pager from 8am to 5pm)

## 2019-01-02 NOTE — Evaluation (Signed)
Occupational Therapy Evaluation Patient Details Name: [name] MRN: [id number] DOB: 01-09-1961 Today's Date: 01/02/2019    History of Present Illness 57yo female with PMHx including PVD, anxiety, GERD, DM, HTN, CRF s/p extensive revascularization during this hospitalization. Angiogram shows minimal flow through the calf even after angioplasty and thrombectomy. Concern for compartment syndrome and pt now s/p LLE fasciotomies (01/01/2019).    Clinical Impression   Pt seen for OT evaluation this date (co-tx with PT as well). Prior to hospital admission, pt was modified independent with mobility from w/c level, performing transfers independently, and mod indep for ADL and most IADL. Pt does not drive and will often use [employer] service for transportation.  Pt lives in a 1 story home with a ramped entrance.  Currently pt demonstrates impairments in strength, pain (chronic neuropathy and minimal surgical pain in LLE), and balance requiring min-mod assist for LB ADL from a seated level. Pt required +2 for safety/equipment mgt for bed mobility. VSS t/o session and requiring supervision and additional time/effort to complete. Pt very cautious to ensure "none of the tubes come out." Pt instructed in PLB during bed mobility to support recovery after exertion with pt able to return demo.  Pt would benefit from skilled OT to address noted impairments and functional limitations (see below for any additional details) in order to maximize safety and independence while minimizing falls risk and caregiver burden.  Upon hospital discharge, recommend pt discharge to STR to facilitate safe eventual return home alone.    Follow Up Recommendations  SNF    Equipment Recommendations  Other (comment)(TBD)    Recommendations for Other Services       Precautions / Restrictions Precautions Precautions: Fall Precaution Comments: w/c level at baseline Restrictions Weight Bearing Restrictions: No      Mobility Bed  Mobility Overal bed mobility: Needs Assistance Bed Mobility: Supine to Sit;Sit to Supine     Supine to sit: +2 for safety/equipment;Supervision;Min guard;HOB elevated Sit to supine: Supervision;Min guard;+2 for safety/equipment   General bed mobility comments: + effort/time to complete, pt eager to perform without assist  Transfers                 General transfer comment: deferred, will assess next session    Balance Overall balance assessment: Needs assistance Sitting-balance support: Single extremity supported;Feet supported Sitting balance-Leahy Scale: Fair Sitting balance - Comments: fair-, occasionally resting UE on pillows                                   ADL either performed or assessed with clinical judgement   ADL Overall ADL's : Needs assistance/impaired Eating/Feeding: Sitting;Independent   Grooming: Sitting;Independent   Upper Body Bathing: Sitting;Set up;Supervision/ safety   Lower Body Bathing: Sitting/lateral leans;Minimal assistance;Moderate assistance   Upper Body Dressing : Sitting;Set up;Supervision/safety   Lower Body Dressing: Sitting/lateral leans;Minimal assistance;Moderate assistance                       Vision Baseline Vision/History: Wears glasses Wears Glasses: At all times Patient Visual Report: No change from baseline       Perception     Praxis      Pertinent Vitals/Pain Pain Assessment: No/denies pain(has constant neuropathy in B hands and B feet)     Hand Dominance     Extremity/Trunk Assessment Upper Extremity Assessment Upper Extremity Assessment: Overall WFL for tasks assessed   Lower  Extremity Assessment Lower Extremity Assessment: Defer to PT evaluation;LLE deficits/detail(RLE with 2 R femoral IVs, end range ROM deferred) LLE Deficits / Details: 2 fasciotomies noted, L heel wound, LLE seeping/edematous, pt able to move independently with increased effort LLE: Unable to fully assess due  to pain       Communication Communication Communication: No difficulties   Cognition Arousal/Alertness: Awake/alert Behavior During Therapy: WFL for tasks assessed/performed Overall Cognitive Status: Within Functional Limits for tasks assessed                                 General Comments: initial difficulty with orientation to day of week/month but with additional time, pt identified Jan and 2020. Follows all commands.    General Comments  wound vac, catheter, all else instact and in place at start and end of session; LLE noted to be mildly edematous and seeping in spots - pt aware and reports nursing aware; O2 sats on RA >98% t/o session, BP at rest 105/48 increasing to 111/66 after bed mobility sitting EOB, HR 94-104.     Exercises Other Exercises Other Exercises: pt instructed in pursed lip breathing during functional bed mobility to improve recovery after exertion, pt able to return demo    Shoulder Instructions      Home Living Family/patient expects to be discharged to:: Private residence Living Arrangements: Alone Available Help at Discharge: Family;Available PRN/intermittently Type of Home: House Home Access: Ramped entrance     Home Layout: One level     Bathroom Shower/Tub: Chief Strategy OfficerTub/shower unit   Bathroom Toilet: Standard     Home Equipment: Environmental consultantWalker - 2 wheels;Cane - single point;Wheelchair - manual          Prior Functioning/Environment Level of Independence: Independent with assistive device(s)        Comments: Pt mod indep at w/c level for mobility, transferring on her own; mod indep for ADL and 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); has RN 2x/wk for L nonhealing wound on foot; lady cleans the house 1x/mo        OT Problem List: Decreased strength;Decreased knowledge of use of DME or AE;Increased edema;Obesity;Impaired balance (sitting and/or standing)      OT Treatment/Interventions: Self-care/ADL training;Balance  training;Therapeutic exercise;Therapeutic activities;DME and/or AE instruction;Patient/family education    OT Goals(Current goals can be found in the care plan section) Acute Rehab OT Goals Patient Stated Goal: to get my leg healed and go back home OT Goal Formulation: With patient Time For Goal Achievement: 01/16/19 Potential to Achieve Goals: Good ADL Goals Pt Will Transfer to Toilet: stand pivot transfer;bedside commode;with supervision(LRAD as needed) Additional ADL Goal #1: Pt will perform seated sponge bath with set up and supervision from w/c or EOB level.  OT Frequency: Min 1X/week   Barriers to D/C:            Co-evaluation PT/OT/SLP Co-Evaluation/Treatment: Yes Reason for Co-Treatment: Complexity of the patient's impairments (multi-system involvement);For patient/therapist safety;To address functional/ADL transfers PT goals addressed during session: Mobility/safety with mobility;Balance;Strengthening/ROM OT goals addressed during session: ADL's and self-care      AM-PAC OT "6 Clicks" Daily Activity     Outcome Measure Help from another person eating meals?: None Help from another person taking care of personal grooming?: None Help from another person toileting, which includes using toliet, bedpan, or urinal?: A Lot Help from another person bathing (including washing, rinsing, drying)?: A Lot Help from another person to put  on and taking off regular upper body clothing?: A Little Help from another person to put on and taking off regular lower body clothing?: A Lot 6 Click Score: 17   End of Session    Activity Tolerance: Patient tolerated treatment well Patient left: in bed;with call bell/phone within reach;Other (comment)(wound vac, catheter, and IVs/lines/leads all in place at start and end of session)  OT Visit Diagnosis: Other abnormalities of gait and mobility (R26.89)                Time: [id number] OT Time Calculation (min): 40 min Charges:  OT General  Charges $OT Visit: 1 Visit OT Evaluation $OT Eval Moderate Complexity: 1 Mod  Richrd PrimeJamie Stiller, MPH, MS, OTR/L ascom [phone] 01/02/19, 3:14 PM

## 2019-01-03 LAB — CBC
HCT: 22.6 % — ABNORMAL LOW (ref 36.0–46.0)
Hemoglobin: 6.7 g/dL — ABNORMAL LOW (ref 12.0–15.0)
MCH: 25.3 pg — ABNORMAL LOW (ref 26.0–34.0)
MCHC: 29.6 g/dL — ABNORMAL LOW (ref 30.0–36.0)
MCV: 85.3 fL (ref 80.0–100.0)
Platelets: 410 10*3/uL — ABNORMAL HIGH (ref 150–400)
RBC: 2.65 MIL/uL — ABNORMAL LOW (ref 3.87–5.11)
RDW: 15.9 % — ABNORMAL HIGH (ref 11.5–15.5)
WBC: 17.8 10*3/uL — ABNORMAL HIGH (ref 4.0–10.5)
nRBC: 0 % (ref 0.0–0.2)

## 2019-01-03 LAB — GLUCOSE, CAPILLARY
Glucose-Capillary: 141 mg/dL — ABNORMAL HIGH (ref 70–99)
Glucose-Capillary: 176 mg/dL — ABNORMAL HIGH (ref 70–99)
Glucose-Capillary: 103 mg/dL — ABNORMAL HIGH (ref 70–99)
Glucose-Capillary: 76 mg/dL (ref 70–99)

## 2019-01-03 LAB — BASIC METABOLIC PANEL
Anion gap: 6 (ref 5–15)
BUN: 24 mg/dL — ABNORMAL HIGH (ref 6–20)
CO2: 27 mmol/L (ref 22–32)
Calcium: 7.3 mg/dL — ABNORMAL LOW (ref 8.9–10.3)
Chloride: 98 mmol/L (ref 98–111)
Creatinine, Ser: 0.97 mg/dL (ref 0.44–1.00)
GFR calc Af Amer: >60 mL/min (ref >60)
GFR calc non Af Amer: >60 mL/min (ref >60)
GLUCOSE: 120 mg/dL — AB (ref 70–99)
Potassium: 4.2 mmol/L (ref 3.5–5.1)
Sodium: 131 mmol/L — ABNORMAL LOW (ref 135–145)

## 2019-01-03 LAB — PREPARE RBC (CROSSMATCH)

## 2019-01-03 LAB — HEMOGLOBIN AND HEMATOCRIT, BLOOD
HEMATOCRIT: 26.5 % — AB (ref 36.0–46.0)
HEMOGLOBIN: 7.9 g/dL — AB (ref 12.0–15.0)

## 2019-01-03 LAB — HEPARIN LEVEL (UNFRACTIONATED): Heparin Unfractionated: 0.11 IU/mL — ABNORMAL LOW (ref 0.30–0.70)

## 2019-01-03 LAB — MAGNESIUM: Magnesium: 1.6 mg/dL — ABNORMAL LOW (ref 1.7–2.4)

## 2019-01-03 MED ORDER — MAGNESIUM SULFATE 2 GM/50ML IV SOLN
2.0000 g | Freq: Once | INTRAVENOUS | Status: AC
  Administered 2019-01-03: 2 g via INTRAVENOUS
  Filled 2019-01-03: qty 50

## 2019-01-03 MED ORDER — SODIUM CHLORIDE 0.9% IV SOLUTION
Freq: Once | INTRAVENOUS | Status: AC
  Administered 2019-01-03: 13:00:00 via INTRAVENOUS

## 2019-01-03 MED ORDER — SODIUM CHLORIDE 1 G PO TABS
2.0000 g | ORAL_TABLET | Freq: Two times a day (BID) | ORAL | Status: DC
  Administered 2019-01-03 – 2019-01-18 (×27): 2 g via ORAL
  Filled 2019-01-03 (×31): qty 2

## 2019-01-03 MED ORDER — CLONAZEPAM 0.125 MG PO TBDP
0.2500 mg | ORAL_TABLET | Freq: Two times a day (BID) | ORAL | Status: DC | PRN
  Administered 2019-01-06 – 2019-01-18 (×7): 0.25 mg via ORAL
  Filled 2019-01-03 (×8): qty 2

## 2019-01-03 MED ORDER — INSULIN GLARGINE 100 UNIT/ML ~~LOC~~ SOLN
35.0000 [IU] | Freq: Every day | SUBCUTANEOUS | Status: DC
  Administered 2019-01-03 – 2019-01-18 (×16): 35 [IU] via SUBCUTANEOUS
  Filled 2019-01-03 (×16): qty 0.35

## 2019-01-03 NOTE — Consult Note (Signed)
ANTICOAGULATION CONSULT NOTE  Pharmacy Consult for Heparin Drip  Indication: Ischemic left leg   Patient Measurements: Height: 5\' 3"  (160 cm) Weight: 271 lb 9.7 oz (123.2 kg) IBW/kg (Calculated) : 52.4 Heparin Dosing Weight: 80 kg  Vital Signs: Temp: 98.1 F (36.7 C) (01/09 0428) Temp Source: Oral (01/09 0428) BP: 105/67 (01/09 0428) Pulse Rate: 89 (01/09 0428)  Labs: Recent Labs    12/31/18 1908  01/01/19 0451 01/01/19 1520 01/01/19 1813 01/02/19 0543 01/02/19 2203 01/03/19 0604 01/03/19 0731  HGB 9.4*  9.3*   < > 8.3* 8.2*  --   --   --  6.7*  --   HCT 30.6*  30.6*   < > 28.0* 27.6*  --   --   --  22.6*  --   PLT 532*  522*   < > 401* 380  --   --   --  410*  --   APTT 51*  --   --   --   --   --   --   --   --   LABPROT 15.9*  --   --   --   --   --   --   --   --   INR 1.28  --   --   --   --   --   --   --   --   HEPARINUNFRC  --   --   --   --   --  <0.10* <0.10*  --  <0.10*  CREATININE 0.85  --  1.12*  --  1.31* 1.16*  --  0.97  --    < > = values in this interval not displayed.    Estimated Creatinine Clearance: 82.5 mL/min (by C-G formula based on SCr of 0.97 mg/dL).   Medical History: Past Medical History:  Diagnosis Date  . Acute pain of right shoulder 05/19/2016  . Acute PN (pyelonephritis) 05/18/2016  . Acute pyelonephritis 05/18/2016  . Anxiety   . Arthritis   . Asthma   . Diabetes mellitus without complication (HCC)   . GERD (gastroesophageal reflux disease)   . Glaucoma   . Hyperlipemia   . Hyperlipemia   . Hypertension   . Peripheral vascular disease [facility])     Assessment: Pharmacy consulted for heparin drip dosing and monitoring in 58 yo female with ischemic left leg. Patient S/P angioplasty,  mechanical thrombectomy and fasciotomy. Patient has clots in distal arteries of lower extremity.  Heparin was started on 1/7 at 600 units/hr with no level drawn and stopped soon afterward due to bleeding  Goal of Therapy:  Heparin level 0.3-0.7  units/ml Monitor platelets by anticoagulation protocol: Yes   Heparin course: (no bolus per Dr Wyn Quaker) 1/8 pm restarted at 800 units/hr 1/8 2203 HL<0.10 rate increased to 1100 units/hr 1/9 0731 HL<0.10    Plan:  Per Dr Wyn Quaker, No Bolus. Increase heparin infusion to 1400 units/hr Check anti-Xa level in 6 hours and daily while on heparin Continue to monitor H&H and platelets  Lowella Bandy, PharmD Clinical Pharmacist 01/03/2019 11:12 AM

## 2019-01-03 NOTE — Consult Note (Signed)
PHARMACY CONSULT NOTE - FOLLOW UP  Pharmacy Consult for Electrolyte Monitoring and Replacement   Recent Labs: Potassium (mmol/L)  Date Value  01/03/2019 4.2   Magnesium (mg/dL)  Date Value  70/48/8891 1.6 (L)   Calcium (mg/dL)  Date Value  69/45/0388 7.3 (L)   Albumin (g/dL)  Date Value  82/80/0349 1.2 (L)   Phosphorus (mg/dL)  Date Value  17/91/5056 3.4   Sodium (mmol/L)  Date Value  01/03/2019 131 (L)   Correct Calcium: 9.54  Assessment: Pharmacy consulted for electrolyte monitoring and replacement in 58 yo female admitted with LLE pain and ulceration. Patient had hypomagnesemia and hypokalemia on admission.   Goal of Therapy:  Electrolytes WNL   Plan:  Magnesium is slightly under normal limits and being replaced with 2 grams IV magnesium sulfate. All other electrolytes wnl.  Will F/U up with AM labs on 1/10 and continue to replace as needed.   Lowella Bandy, PharmD Clinical Pharmacist 01/03/2019 11:23 AM

## 2019-01-03 NOTE — Anesthesia Postprocedure Evaluation (Signed)
Anesthesia Post Note  Patient: Ednamae L Eastlick  Procedure(s) Performed: FASCIOTOMY LEFT LEG (Left )  Patient location during evaluation: PACU Anesthesia Type: General Level of consciousness: awake and alert Pain management: pain level controlled Vital Signs Assessment: post-procedure vital signs reviewed and stable Respiratory status: spontaneous breathing, nonlabored ventilation, respiratory function stable and patient connected to nasal cannula oxygen Cardiovascular status: blood pressure returned to baseline and stable Postop Assessment: no apparent nausea or vomiting Anesthetic complications: no     Last Vitals:  Vitals:   01/03/19 0428 01/03/19 1243  BP: 105/67 109/66  Pulse: 89 (!) 102  Resp: 18 18  Temp: 36.7 C 36.7 C  SpO2: 100% 100%    Last Pain:  Vitals:   01/03/19 1243  TempSrc: Oral  PainSc:                  Yevette Edwards

## 2019-01-03 NOTE — Consult Note (Addendum)
ANTICOAGULATION CONSULT NOTE  Pharmacy Consult for Heparin Drip  Indication: Ischemic left leg   Patient Measurements: Height: 5\' 3"  (160 cm) Weight: 271 lb 9.7 oz (123.2 kg) IBW/kg (Calculated) : 52.4 Heparin Dosing Weight: 80 kg  Vital Signs: Temp: 99.3 F (37.4 C) (01/09 2020) Temp Source: Oral (01/09 2020) BP: 96/84 (01/09 2020) Pulse Rate: 115 (01/09 2020)  Labs: Recent Labs    01/01/19 0451 01/01/19 1520 01/01/19 1813  01/02/19 0543 01/02/19 2203 01/03/19 0604 01/03/19 0731 01/03/19 2048  HGB 8.3* 8.2*  --   --   --   --  6.7*  --  7.9*  HCT 28.0* 27.6*  --   --   --   --  22.6*  --  26.5*  PLT 401* 380  --   --   --   --  410*  --   --   HEPARINUNFRC  --   --   --    < > <0.10* <0.10*  --  <0.10* 0.11*  CREATININE 1.12*  --  1.31*  --  1.16*  --  0.97  --   --    < > = values in this interval not displayed.    Estimated Creatinine Clearance: 82.5 mL/min (by C-G formula based on SCr of 0.97 mg/dL).   Medical History: Past Medical History:  Diagnosis Date  . Acute pain of right shoulder 05/19/2016  . Acute PN (pyelonephritis) 05/18/2016  . Acute pyelonephritis 05/18/2016  . Anxiety   . Arthritis   . Asthma   . Diabetes mellitus without complication (HCC)   . GERD (gastroesophageal reflux disease)   . Glaucoma   . Hyperlipemia   . Hyperlipemia   . Hypertension   . Peripheral vascular disease [facility])     Assessment: Pharmacy consulted for heparin drip dosing and monitoring in 58 yo female with ischemic left leg. Patient S/P angioplasty,  mechanical thrombectomy and fasciotomy. Patient has clots in distal arteries of lower extremity.  Heparin was started on 1/7 at 600 units/hr with no level drawn and stopped soon afterward due to bleeding  Goal of Therapy:  Heparin level 0.3-0.7 units/ml Monitor platelets by anticoagulation protocol: Yes   Heparin course: (no bolus per Dr Wyn Quaker) 1/8 pm restarted at 800 units/hr 1/8 2203 HL<0.10 rate increased to 1100  units/hr 1/9 0731 HL<0.10    Plan:  Per Dr Wyn Quaker, No Bolus. Increase heparin infusion to 1400 units/hr Check anti-Xa level in 6 hours and daily while on heparin Continue to monitor H&H and platelets  1/9 2100 HL 0.11. Increase to 1700 units/hr. No bolus per consult. Recheck heparin level and CBC with AM labs.  1/10 AM heparin level 0.28. Increase to 1850 units/hr and recheck in 6 hours.  Erich Montane, PharmD Clinical Pharmacist 01/03/2019 10:28 PM

## 2019-01-03 NOTE — Clinical Social Work Note (Signed)
Patient is to have an amputation tomorrow. CSW will defer assessment until after her procedure. York Spaniel MSW,LCSW [phone]

## 2019-01-03 NOTE — Progress Notes (Signed)
Patient off the floor when I made rounds. Seen by Selena Batten earlier today. Foot demarcating and likely will need BKA versus AKA. Will allow demarcation for another few days and make determination.  Surgery likely early next week.

## 2019-01-03 NOTE — Progress Notes (Signed)
Back-Up chaplain received an OR for a spiritual care consult for this patient, but was unable to complete the visit earlier. On-Call chaplain followed up with the patient in the wake of her receipt of the life-altering news that her leg would need to be amputated. Upon entering the room, the patient was tearful and expressed a number of understandable emotions in the wake of this news. She has an abundance of support (family, friends, etc.) and is concerned about what life will look like after the amputation, whether friends will continue to be supportive, a desire to remain in God's care, and uncertainty about how she will move forward. Patient requested prayer and she and chaplain prayed together. She expressed gratitude for the support. Patient expects a number of visitors and requests follow-up in the next few days.

## 2019-01-03 NOTE — Progress Notes (Signed)
   01/03/19 1300  Clinical Encounter Type  Visited With Patient not available (Patient receiving care. Will return to complete OR.)   Chaplain responded to OR for prayer. The nursing team was providing care after the patient's doctor departure. Chaplain was asked to return later. Follow-up is needed.

## 2019-01-03 NOTE — Progress Notes (Signed)
Inpatient Diabetes Program Recommendations  AACE/ADA: New Consensus Statement on Inpatient Glycemic Control  Target Ranges:  Prepandial:   less than 140 mg/dL      Peak postprandial:   less than 180 mg/dL (1-2 hours)      Critically ill patients:  140 - 180 mg/dL   Results for [name], [name] (MRN [id number]) as of 01/03/2019 08:28  Ref. Range 01/02/2019 07:30 01/02/2019 11:39 01/02/2019 15:34 01/02/2019 21:18 01/03/2019 08:06  Glucose-Capillary Latest Ref Range: 70 - 99 mg/dL 073 (H) 710 (H) 626 (H) 96 141 (H)  Results for [name], [name] (MRN [id number]) as of 01/03/2019 08:28  Ref. Range 01/02/2019 05:43  Hemoglobin A1C Latest Ref Range: 4.8 - 5.6 % 7.6 (H)   Review of Glycemic Control  Diabetes history: DM2 Outpatient Diabetes medications: Lantus 70 units daily, Humalog 15 units TID with meals, Metformin 500 mg BID Current orders for Inpatient glycemic control: Lantus 40 units QHS, Novolog 0-20 units TID with meals, Novolog 0-5 units QHS, Novolog 4 units TID with meals, Metformin 500 mg BID  Inpatient Diabetes Program Recommendations:  Insulin - Basal: Per chart review, Lantus was NOT GIVEN last night (charted as pt refused because she takes Lantus in the mornings). Please consider changing Lantus 35 units daily (to start this morning).  NOTE: Spoke with patient over the phone regarding DM control and outpatient DM regimen. Patient states that she takes Lantus 70 units daily, Humalog 15 units TID with meals, and Metformin 500 mg BID as an outpatient for DM control. Patient states that her glucose is usually in the 100's mg/dl at home. Noted A1C of 7.6% on 01/02/19 indicating an average glucose of 171 mg/dl which correlates with patient's reported glucose at home. Patient states that she is consistently taking DM medications as noted above. Discussed Lantus dose that was scheduled for last night and patient states that she takes Lantus at home in the mornings and she would prefer to take the Lantus in the  mornings here as well. Informed patient she was scheduled to receive Lantus 40 units last night but it will be recommended to decrease the Lantus back down to 35 units and rescheduled for daily to be given in the mornings. Patient verbalized understanding of information discussed and states she has no questions or concerns regarding DM at this time.   Thanks, Orlando Penner, RN, MSN, CDE Diabetes Coordinator Inpatient Diabetes Program [phone] (Team Pager from 8am to 5pm)

## 2019-01-03 NOTE — Progress Notes (Signed)
Hold on wound vac change today for possible surgery tomorrow per [place] Bailiff, PA.

## 2019-01-03 NOTE — Progress Notes (Signed)
OT Cancellation Note  Patient Details Name: [name] MRN: [id number] DOB: 1961-04-23   Cancelled Treatment:    Reason Eval/Treat Not Completed: Medical issues which prohibited therapy. Chart reviewed. Pt noted with Hgb of 6.7. Per therapy protocol, will hold OT tx at this time and re-attempt as medically appropriate.    Richrd Prime, MPH, MS, OTR/L ascom [phone] 01/03/19, 10:18 AM

## 2019-01-03 NOTE — Consult Note (Signed)
ANTICOAGULATION CONSULT NOTE - Initial Consult  Pharmacy Consult for Heparin Drip  Indication: Ischemic left leg   Allergies  Allergen Reactions  . Biaxin [Clarithromycin] Rash    Patient states this medication gives her severe rash and thrush in the mouth.    Patient Measurements: Height: 5\' 3"  (160 cm) Weight: 255 lb 11.7 oz (116 kg) IBW/kg (Calculated) : 52.4 Heparin Dosing Weight: 80 kg  Vital Signs: Temp: 98.7 F (37.1 C) (01/08 1910) Temp Source: Oral (01/08 1910) BP: 115/51 (01/08 1910) Pulse Rate: 105 (01/08 1910)  Labs: Recent Labs    12/31/18 1908 01/01/19 0026 01/01/19 0451 01/01/19 1520 01/01/19 1813 01/02/19 0543 01/02/19 2203  HGB 9.4*  9.3* 8.6* 8.3* 8.2*  --   --   --   HCT 30.6*  30.6* 28.7* 28.0* 27.6*  --   --   --   PLT 532*  522* 469* 401* 380  --   --   --   APTT 51*  --   --   --   --   --   --   LABPROT 15.9*  --   --   --   --   --   --   INR 1.28  --   --   --   --   --   --   HEPARINUNFRC  --   --   --   --   --  <0.10* <0.10*  CREATININE 0.85  --  1.12*  --  1.31* 1.16*  --     Estimated Creatinine Clearance: 66.5 mL/min (A) (by C-G formula based on SCr of 1.16 mg/dL (H)).   Medical History: Past Medical History:  Diagnosis Date  . Acute pain of right shoulder 05/19/2016  . Acute PN (pyelonephritis) 05/18/2016  . Acute pyelonephritis 05/18/2016  . Anxiety   . Arthritis   . Asthma   . Diabetes mellitus without complication (HCC)   . GERD (gastroesophageal reflux disease)   . Glaucoma   . Hyperlipemia   . Hyperlipemia   . Hypertension   . Peripheral vascular disease [facility])     Assessment: Pharmacy consulted for heparin drip dosing and monitoring in 58 yo female with ischemic left leg. Patient S/P angioplasty,  mechanical thrombectomy and fasciotomy. Patient has clots in distal arteries of lower extremity.     Goal of Therapy:  Heparin level 0.3-0.7 units/ml Monitor platelets by anticoagulation protocol: Yes   Plan:   Heparin drip stopped 1/7 due to bleeding Per Dr Wyn Quaker, No Bolus. Start heparin infusion at 800 units/hr Check anti-Xa level in 6 hours and daily while on heparin Continue to monitor H&H and platelets  1/8 PM heparin level <0.1. No bolus per consult. Increase rate to 1100 units/hr and recheck in 6 hours.  Erich Montane, PharmD, BCPS Clinical Pharmacist 01/03/2019 12:34 AM

## 2019-01-03 NOTE — Progress Notes (Signed)
[facility] Daily Progress Note   Subjective: 3 Days Post-Op: Ultrasound guidance for vascular access right femoral artery, Catheter placement into left common femoral artery from right femoral approach, Selective left lower extremity angiogram, Placement of 8 mg of TPA with an infusion catheter to the left SFA and popliteal arteries, Placement of infusion catheter in left SFA, popliteal artery, and proximal posterior tibial artery for continuous infusion of TPA overnight, Placement of a right femoral venous triple-lumen catheter with ultrasound and fluoroscopy guidance  2 Days Post-Op: Angiogram through existing catheter left lower extremity, Catheter placement into left posterior tibial artery from right femoral approach, Mechanical thrombectomy to the left SFA, popliteal artery, tibioperoneal trunk, and posterior tibial arteries with the penumbra cat 6 device, Percutaneous transluminal angioplasty of left posterior tibial artery and tibioperoneal trunk with 3 mm diameter by 30 cm length angioplasty balloon with StarClose closure device right femoral artery  2 Days Post-Op: Left leg / calf fasciotomies  Patient with continued pain to the left lower extremity. No issues overnight.   Objective: Vitals:   01/02/19 1800 01/02/19 1910 01/03/19 0428 01/03/19 0611  BP:  (!) 115/51 105/67   Pulse: 99 (!) 105 89   Resp: 18 16 18    Temp:  98.7 F (37.1 C) 98.1 F (36.7 C)   TempSrc:  Oral Oral   SpO2: 100% 99% 100%   Weight:    123.2 kg  Height:        Intake/Output Summary (Last 24 hours) at 01/03/2019 1227 Last data filed at 01/03/2019 1100 Gross per 24 hour  Intake 1978.07 ml  Output 1005 ml  Net 973.07 ml   Physical Exam: A&Ox3, NAD CV: RRR Pulmonary: CTA Bilaterally Abdomen: Soft, Nontender, Nondistended Vascular:  Left Lower Extremity: Warm until approximately mid calf then becomes cooler and purple. VAC is in place sealed and to suction.     Laboratory: CBC    Component Value Date/Time   WBC 17.8 (H) 01/03/2019 0604   HGB 6.7 (L) 01/03/2019 0604   HCT 22.6 (L) 01/03/2019 0604   PLT 410 (H) 01/03/2019 0604   BMET    Component Value Date/Time   NA 131 (L) 01/03/2019 0604   K 4.2 01/03/2019 0604   CL 98 01/03/2019 0604   CO2 27 01/03/2019 0604   GLUCOSE 120 (H) 01/03/2019 0604   BUN 24 (H) 01/03/2019 0604   CREATININE 0.97 01/03/2019 0604   CALCIUM 7.3 (L) 01/03/2019 0604   GFRNONAA >60 01/03/2019 0604   GFRAA >60 01/03/2019 0604   Assessment/Planning: The patient is a 58 year old female with severe left lower extremity PAD  1) Hbg this AM 6.7 - will transfuse 2 units PRBC 2) Repeated NaCl and Mag 3) AM labs 4) Discussed the continued deterioration of the patient LLE. Discussed the possible need of amputation. Patient seems to be OK with this and is consulting to proceed if needed.   Discussed with Dr. Wallis Mart Quincy Boy PA-C 01/03/2019 12:27 PM

## 2019-01-03 NOTE — Care Management Important Message (Signed)
Copy of signed Medicare IM left with patient in room. 

## 2019-01-04 LAB — BASIC METABOLIC PANEL
Anion gap: 7 (ref 5–15)
BUN: 18 mg/dL (ref 6–20)
CO2: 25 mmol/L (ref 22–32)
Calcium: 7.6 mg/dL — ABNORMAL LOW (ref 8.9–10.3)
Chloride: 99 mmol/L (ref 98–111)
Creatinine, Ser: 0.73 mg/dL (ref 0.44–1.00)
GFR calc Af Amer: >60 mL/min (ref >60)
GFR calc non Af Amer: >60 mL/min (ref >60)
Glucose, Bld: 114 mg/dL — ABNORMAL HIGH (ref 70–99)
POTASSIUM: 4.4 mmol/L (ref 3.5–5.1)
Sodium: 131 mmol/L — ABNORMAL LOW (ref 135–145)

## 2019-01-04 LAB — GLUCOSE, CAPILLARY
Glucose-Capillary: 120 mg/dL — ABNORMAL HIGH (ref 70–99)
Glucose-Capillary: 194 mg/dL — ABNORMAL HIGH (ref 70–99)
Glucose-Capillary: 225 mg/dL — ABNORMAL HIGH (ref 70–99)
Glucose-Capillary: 134 mg/dL — ABNORMAL HIGH (ref 70–99)

## 2019-01-04 LAB — PROTIME-INR
INR: 1.13
Prothrombin Time: 14.4 seconds (ref 11.4–15.2)

## 2019-01-04 LAB — TYPE AND SCREEN
ABO/RH(D): A POS
Antibody Screen: NEGATIVE
Unit division: 0

## 2019-01-04 LAB — CBC
HCT: 24.1 % — ABNORMAL LOW (ref 36.0–46.0)
Hemoglobin: 7.2 g/dL — ABNORMAL LOW (ref 12.0–15.0)
MCH: 24.6 pg — ABNORMAL LOW (ref 26.0–34.0)
MCHC: 29.9 g/dL — ABNORMAL LOW (ref 30.0–36.0)
MCV: 82.3 fL (ref 80.0–100.0)
NRBC: 0 % (ref 0.0–0.2)
Platelets: 395 10*3/uL (ref 150–400)
RBC: 2.93 MIL/uL — ABNORMAL LOW (ref 3.87–5.11)
RDW: 17.7 % — ABNORMAL HIGH (ref 11.5–15.5)
WBC: 13.4 10*3/uL — ABNORMAL HIGH (ref 4.0–10.5)

## 2019-01-04 LAB — MAGNESIUM: Magnesium: 1.5 mg/dL — ABNORMAL LOW (ref 1.7–2.4)

## 2019-01-04 LAB — BPAM RBC
Blood Product Expiration Date: 202001252359
ISSUE DATE / TIME: 202001091247
Unit Type and Rh: 6200

## 2019-01-04 LAB — HEPARIN LEVEL (UNFRACTIONATED)
HEPARIN UNFRACTIONATED: 0.36 [IU]/mL (ref 0.30–0.70)
Heparin Unfractionated: 0.28 IU/mL — ABNORMAL LOW (ref 0.30–0.70)
Heparin Unfractionated: 0.46 IU/mL (ref 0.30–0.70)

## 2019-01-04 LAB — PREPARE RBC (CROSSMATCH)

## 2019-01-04 MED ORDER — MAGNESIUM SULFATE 4 GM/100ML IV SOLN
4.0000 g | Freq: Once | INTRAVENOUS | Status: AC
  Administered 2019-01-04: 4 g via INTRAVENOUS
  Filled 2019-01-04: qty 100

## 2019-01-04 MED ORDER — NICOTINE POLACRILEX 2 MG MT GUM
4.0000 mg | CHEWING_GUM | OROMUCOSAL | Status: DC | PRN
  Administered 2019-01-04 – 2019-01-06 (×7): 4 mg via ORAL
  Filled 2019-01-04 (×10): qty 2

## 2019-01-04 MED ORDER — CEFAZOLIN SODIUM-DEXTROSE 2-4 GM/100ML-% IV SOLN
2.0000 g | INTRAVENOUS | Status: AC
  Administered 2019-01-07: 2 g via INTRAVENOUS
  Filled 2019-01-04: qty 100

## 2019-01-04 MED ORDER — SODIUM CHLORIDE 0.9% IV SOLUTION
Freq: Once | INTRAVENOUS | Status: AC
  Administered 2019-01-04: 16:00:00 via INTRAVENOUS

## 2019-01-04 MED ORDER — LOPERAMIDE HCL 2 MG PO CAPS
2.0000 mg | ORAL_CAPSULE | Freq: Four times a day (QID) | ORAL | Status: DC | PRN
  Administered 2019-01-05: 2 mg via ORAL
  Filled 2019-01-04 (×2): qty 1

## 2019-01-04 MED ORDER — SODIUM CHLORIDE 0.9 % IV SOLN
INTRAVENOUS | Status: DC
  Administered 2019-01-07 – 2019-01-08 (×2): via INTRAVENOUS

## 2019-01-04 NOTE — Progress Notes (Signed)
The patient wanted to know if she continuous to smoke if it will affect her other leg. She was asked if she wanted a nicotine patch yet she refused and said she makes her decisions. If she could she would smoke before her procedure.

## 2019-01-04 NOTE — Progress Notes (Signed)
MD notified: Hemoglobin 7.2 this morning.

## 2019-01-04 NOTE — Consult Note (Signed)
ANTICOAGULATION CONSULT NOTE  Pharmacy Consult for Heparin Drip  Indication: Ischemic left leg   Patient Measurements: Height: 5\' 3"  (160 cm) Weight: 271 lb 9.7 oz (123.2 kg) IBW/kg (Calculated) : 52.4 Heparin Dosing Weight: 80 kg  Vital Signs: Temp: 98.5 F (36.9 C) (01/10 1829) Temp Source: Oral (01/10 1829) BP: 111/55 (01/10 1829) Pulse Rate: 115 (01/10 1829)  Labs: Recent Labs    01/02/19 0543  01/03/19 0604  01/03/19 2048 01/04/19 0552 01/04/19 1405  HGB  --    < > 6.7*  --  7.9* 7.2*  --   HCT  --   --  22.6*  --  26.5* 24.1*  --   PLT  --   --  410*  --   --  395  --   LABPROT  --   --   --   --   --  14.4  --   INR  --   --   --   --   --  1.13  --   HEPARINUNFRC <0.10*   < >  --    < > 0.11* 0.28* 0.36  CREATININE 1.16*  --  0.97  --   --  0.73  --    < > = values in this interval not displayed.    Estimated Creatinine Clearance: 100 mL/min (by C-G formula based on SCr of 0.73 mg/dL).   Assessment: Pharmacy consulted for heparin drip dosing and monitoring in 58 yo female with ischemic left leg. Patient S/P angioplasty,  mechanical thrombectomy and fasciotomy. Patient has clots in distal arteries of lower extremity.  Heparin was started on 1/7 at 600 units/hr with no level drawn and stopped soon afterward due to bleeding  Goal of Therapy:  Heparin level 0.3-0.7 units/ml Monitor platelets by anticoagulation protocol: Yes   Heparin course: (no bolus per Dr Wyn Quaker) 1/8 pm restarted at 800 units/hr 1/8 2203 HL<0.10 rate increased to 1100 units/hr 1/9 0731 HL<0.10 1/9 2100 HL 0.11. Increase to 1700 units/hr. No bolus per consult. Recheck heparin level and CBC with AM labs.  1/10 AM heparin level 0.28. Increase to 1850 units/hr and recheck in 6 hours.   Plan:  Per Dr Wyn Quaker, No Bolus.  1/10 1405 HL 0.36, therapeutic x1. Continue at current rate. Recheck in 6h. Check anti-Xa level in 6 hours and daily while on heparin Continue to monitor H&H and  platelets    Marty Heck, PharmD Clinical Pharmacist 01/04/2019 7:21 PM

## 2019-01-04 NOTE — Consult Note (Addendum)
ANTICOAGULATION CONSULT NOTE  Pharmacy Consult for Heparin Drip  Indication: Ischemic left leg   Patient Measurements: Height: 5\' 3"  (160 cm) Weight: 271 lb 9.7 oz (123.2 kg) IBW/kg (Calculated) : 52.4 Heparin Dosing Weight: 80 kg  Vital Signs: Temp: 98.3 F (36.8 C) (01/10 2125) Temp Source: Oral (01/10 2125) BP: 114/68 (01/10 2125) Pulse Rate: 111 (01/10 2125)  Labs: Recent Labs    01/02/19 0543  01/03/19 0604  01/03/19 2048 01/04/19 0552 01/04/19 1405 01/04/19 2148  HGB  --    < > 6.7*  --  7.9* 7.2*  --   --   HCT  --   --  22.6*  --  26.5* 24.1*  --   --   PLT  --   --  410*  --   --  395  --   --   LABPROT  --   --   --   --   --  14.4  --   --   INR  --   --   --   --   --  1.13  --   --   HEPARINUNFRC <0.10*   < >  --    < > 0.11* 0.28* 0.36 0.46  CREATININE 1.16*  --  0.97  --   --  0.73  --   --    < > = values in this interval not displayed.    Estimated Creatinine Clearance: 100 mL/min (by C-G formula based on SCr of 0.73 mg/dL).   Assessment: Pharmacy consulted for heparin drip dosing and monitoring in 58 yo female with ischemic left leg. Patient S/P angioplasty,  mechanical thrombectomy and fasciotomy. Patient has clots in distal arteries of lower extremity.  Heparin was started on 1/7 at 600 units/hr with no level drawn and stopped soon afterward due to bleeding  Goal of Therapy:  Heparin level 0.3-0.7 units/ml Monitor platelets by anticoagulation protocol: Yes   Heparin course: (no bolus per Dr Wyn Quaker) 1/8 pm restarted at 800 units/hr 1/8 2203 HL<0.10 rate increased to 1100 units/hr 1/9 0731 HL<0.10 1/9 2100 HL 0.11. Increase to 1700 units/hr. No bolus per consult. Recheck heparin level and CBC with AM labs.  1/10 AM heparin level 0.28. Increase to 1850 units/hr and recheck in 6 hours.   Plan:  Per Dr Wyn Quaker, No Bolus.  1/10 1405 HL 0.36, therapeutic x1. Continue at current rate. Recheck in 6h. Check anti-Xa level in 6 hours and daily while on  heparin Continue to monitor H&H and platelets  1/10 PM heparin level 0.46. Continue current regimen. Recheck heparin level and CBC with tomorrow AM labs.  1/11 AM heparin level 0.48. Continue current regimen. Recheck heparin level and CBC with tomorrow AM labs.  Erich Montane, PharmD Clinical Pharmacist 01/04/2019 10:14 PM

## 2019-01-04 NOTE — Progress Notes (Signed)
[facility]  Daily Progress Note   Subjective  - 3 Days Post-Op  Having a fair bit of left foot and lower leg pain.  Understands that her foot is not salvageable at this point.  No major events overnight.  Objective Vitals:   01/03/19 1514 01/03/19 2020 01/04/19 0427 01/04/19 0829  BP: 122/60 96/84 (!) 111/55 (!) 105/43  Pulse: (!) 107 (!) 115 (!) 110 (!) 103  Resp: 16 18 18    Temp: 98.5 F (36.9 C) 99.3 F (37.4 C) 97.7 F (36.5 C)   TempSrc: Oral Oral Oral   SpO2: 100% 100% 96%   Weight:      Height:        Intake/Output Summary (Last 24 hours) at 01/04/2019 1248 Last data filed at 01/04/2019 1024 Gross per 24 hour  Intake 1448.52 ml  Output 1650 ml  Net -201.48 ml    PULM  CTAB CV  RRR VASC  vacs have a good seal.  Her lower leg is demarcating about the level of the mid to distal calf.  This appears to be just below the level that would be required for a below-knee amputation.  Laboratory CBC    Component Value Date/Time   WBC 13.4 (H) 01/04/2019 0552   HGB 7.2 (L) 01/04/2019 0552   HCT 24.1 (L) 01/04/2019 0552   PLT 395 01/04/2019 0552    BMET    Component Value Date/Time   NA 131 (L) 01/04/2019 0552   K 4.4 01/04/2019 0552   CL 99 01/04/2019 0552   CO2 25 01/04/2019 0552   GLUCOSE 114 (H) 01/04/2019 0552   BUN 18 01/04/2019 0552   CREATININE 0.73 01/04/2019 0552   CALCIUM 7.6 (L) 01/04/2019 0552   GFRNONAA >60 01/04/2019 0552   GFRAA >60 01/04/2019 8421    Assessment/Planning: POD #3 s/p left lower extremity intervention as well as fasciotomies   We are giving the tissue some time to demarcate which it is doing.  Although it is somewhat marginal, the current level of demarcation would appear to be possible to have a below-knee amputation.  Giving this another couple of days to ensure that that level is appropriate I think is reasonable.  The patient's pain control is reasonable.  She is not toxic or septic.  She does have  anemia with a hemoglobin of 7.2 and an upcoming requirement for surgery which will certainly lose at least a couple 100 cc of blood so I would like to give her another unit of blood today.  We will tentatively plan for left below-knee amputation on Monday.  It was discussed with the patient if the level demarcates any higher, an above-knee amputation may be required.    Festus Barren  01/04/2019, 12:48 PM

## 2019-01-04 NOTE — Consult Note (Signed)
PHARMACY CONSULT NOTE - FOLLOW UP  Pharmacy Consult for Electrolyte Monitoring and Replacement   Recent Labs: Potassium (mmol/L)  Date Value  01/04/2019 4.4   Magnesium (mg/dL)  Date Value  62/86/3817 1.5 (L)   Calcium (mg/dL)  Date Value  71/16/5790 7.6 (L)   Albumin (g/dL)  Date Value  38/33/3832 1.2 (L)   Phosphorus (mg/dL)  Date Value  91/91/6606 3.4   Sodium (mmol/L)  Date Value  01/04/2019 131 (L)   Correct Calcium: 9.84  Assessment: Pharmacy consulted for electrolyte monitoring and replacement in 58 yo female admitted with LLE pain and ulceration. Patient had hypomagnesemia and hypokalemia on admission. Yesterday magnesium was replaced with 2 grams IV magnesium sulfate.  Goal of Therapy:  Electrolytes WNL   Plan:  Magnesium is still under normal limits and being replaced today with 4 grams IV magnesium sulfate. All other electrolytes wnl.  Will F/U up with AM labs on 1/11 and continue to replace as needed.   Lowella Bandy, PharmD Clinical Pharmacist 01/04/2019 8:02 AM

## 2019-01-04 NOTE — Progress Notes (Signed)
Physical Therapy Treatment Patient Details Name: [name] MRN: [id number] DOB: 1961/02/18 Today's Date: 01/04/2019    History of Present Illness Pt is a 58 yo female with PMHx including PVD, anxiety, GERD, DM, HTN, CRF s/p extensive LLE revascularization during this hospitalization. Angiogram shows minimal flow through the calf even after angioplasty and thrombectomy. Concern for compartment syndrome and pt now s/p LLE fasciotomies (01/01/2019).     PT Comments    Pt presents with deficits in strength, transfers, mobility, gait, balance, and activity tolerance.  Per nursing supine therex only this session secondary to dropping Hgb with transfusion pending.  Pt tolerated below therex well but required several breaks secondary to feeling overwhelmed by upcoming amputation.  Emotional support provided along with education regarding community resources such as amputation support groups. Pt will benefit from PT services in a SNF setting upon discharge to safely address above deficits for decreased caregiver assistance and eventual return to PLOF.     Follow Up Recommendations  SNF     Equipment Recommendations  None recommended by PT    Recommendations for Other Services       Precautions / Restrictions Precautions Precautions: Fall Precaution Comments: w/c level at baseline Restrictions Weight Bearing Restrictions: Yes RLE Weight Bearing: Weight bearing as tolerated LLE Weight Bearing: Weight bearing as tolerated Other Position/Activity Restrictions: LLE WBAT per verbal order 01/03/19 from Dr. Wyn Quakerew    Mobility  Bed Mobility Overal bed mobility: Needs Assistance             General bed mobility comments: NT, per nursing supine therex only secondary to dropping Hgb with transfusion pending  Transfers                 General transfer comment: Pt unable to clear EOB completely but able [facility]  Ambulation/Gait                  Stairs             Wheelchair Mobility    Modified Rankin (Stroke Patients Only)       Balance                                            Cognition Arousal/Alertness: Awake/alert Behavior During Therapy: WFL for tasks assessed/performed Overall Cognitive Status: Within Functional Limits for tasks assessed                                        Exercises Total Joint Exercises Ankle Circles/Pumps: Strengthening;Right;10 reps;15 reps Quad Sets: Strengthening;Both;10 reps;15 reps Gluteal Sets: Strengthening;Both;10 reps;15 reps Heel Slides: AAROM;Both;5 reps Hip ABduction/ADduction: AAROM;AROM;Both;5 reps;10 reps Straight Leg Raises: AROM;AAROM;Both;5 reps;10 reps Other Exercises Other Exercises: HEP education and review for LLE APs, BLE QS, GS, LAQ, knee flex, hip abd x 5-10 each 4-5x/day as tolerated    General Comments        Pertinent Vitals/Pain Pain Assessment: No/denies pain    Home Living                      Prior Function            PT Goals (current goals can now be found in the care plan section)  Frequency    Min 2X/week      PT Plan Current plan remains appropriate    Co-evaluation              AM-PAC PT "6 Clicks" Mobility   Outcome Measure  Help needed turning from your back to your side while in a flat bed without using bedrails?: A Little Help needed moving from lying on your back to sitting on the side of a flat bed without using bedrails?: A Little Help needed moving to and from a bed to a chair (including a wheelchair)?: A Lot Help needed standing up from a chair using your arms (e.g., wheelchair or bedside chair)?: A Lot Help needed to walk in hospital room?: Total Help needed climbing 3-5 steps with a railing? : Total 6 Click Score: 12    End of Session   Activity Tolerance: Patient tolerated treatment well Patient left: in bed;with call bell/phone within  reach;with nursing/sitter in room;with bed alarm set Nurse Communication: Mobility status PT Visit Diagnosis: Muscle weakness (generalized) (M62.81)     Time: 1354-1410 PT Time Calculation (min) (ACUTE ONLY): 16 min  Charges:  $Therapeutic Exercise: 8-22 mins                     D. Scott Jazzlyn Huizenga PT, DPT 01/04/19, 3:20 PM

## 2019-01-04 NOTE — Progress Notes (Signed)
Can you add another order for type and screen as the one from yesterday expired at midnight in order for pharmacy to provided the unit of blood. thanks.

## 2019-01-05 LAB — BASIC METABOLIC PANEL
Anion gap: 9 (ref 5–15)
BUN: 11 mg/dL (ref 6–20)
CALCIUM: 8.2 mg/dL — AB (ref 8.9–10.3)
CO2: 26 mmol/L (ref 22–32)
Chloride: 100 mmol/L (ref 98–111)
Creatinine, Ser: 0.7 mg/dL (ref 0.44–1.00)
GFR calc Af Amer: >60 mL/min (ref >60)
GFR calc non Af Amer: >60 mL/min (ref >60)
Glucose, Bld: 110 mg/dL — ABNORMAL HIGH (ref 70–99)
Potassium: 4.4 mmol/L (ref 3.5–5.1)
Sodium: 135 mmol/L (ref 135–145)

## 2019-01-05 LAB — GLUCOSE, CAPILLARY
Glucose-Capillary: 112 mg/dL — ABNORMAL HIGH (ref 70–99)
Glucose-Capillary: 151 mg/dL — ABNORMAL HIGH (ref 70–99)
Glucose-Capillary: 125 mg/dL — ABNORMAL HIGH (ref 70–99)
Glucose-Capillary: 161 mg/dL — ABNORMAL HIGH (ref 70–99)

## 2019-01-05 LAB — MAGNESIUM: Magnesium: 1.6 mg/dL — ABNORMAL LOW (ref 1.7–2.4)

## 2019-01-05 LAB — TYPE AND SCREEN
ABO/RH(D): A POS
Antibody Screen: NEGATIVE
Unit division: 0

## 2019-01-05 LAB — BPAM RBC
Blood Product Expiration Date: 202001282359
ISSUE DATE / TIME: 202001101804
Unit Type and Rh: 6200

## 2019-01-05 LAB — CBC
HEMATOCRIT: 28 % — AB (ref 36.0–46.0)
Hemoglobin: 8.5 g/dL — ABNORMAL LOW (ref 12.0–15.0)
MCH: 25.5 pg — ABNORMAL LOW (ref 26.0–34.0)
MCHC: 30.4 g/dL (ref 30.0–36.0)
MCV: 84.1 fL (ref 80.0–100.0)
Platelets: 379 10*3/uL (ref 150–400)
RBC: 3.33 MIL/uL — ABNORMAL LOW (ref 3.87–5.11)
RDW: 17.5 % — AB (ref 11.5–15.5)
WBC: 12.8 10*3/uL — ABNORMAL HIGH (ref 4.0–10.5)
nRBC: 0 % (ref 0.0–0.2)

## 2019-01-05 LAB — HEPARIN LEVEL (UNFRACTIONATED): Heparin Unfractionated: 0.48 IU/mL (ref 0.30–0.70)

## 2019-01-05 MED ORDER — SODIUM CHLORIDE 0.9 % IV SOLN
INTRAVENOUS | Status: DC | PRN
  Administered 2019-01-05 – 2019-01-16 (×10): 250 mL via INTRAVENOUS
  Administered 2019-01-17: 500 mL via INTRAVENOUS

## 2019-01-05 MED ORDER — MAGNESIUM SULFATE 2 GM/50ML IV SOLN
2.0000 g | Freq: Once | INTRAVENOUS | Status: AC
  Administered 2019-01-05: 2 g via INTRAVENOUS
  Filled 2019-01-05: qty 50

## 2019-01-05 NOTE — Progress Notes (Signed)
[facility]  Daily Progress Note   Subjective  - 4 Days Post-Op  Overnight patient had diarrhea, since resolved.  She states she thinks it was due to nerves, she made multiple phone calls to friends/family to discuss prognosis and future amputation.  No BMs since then.  Otherwise doing well.  Afebrile, pain controlled. Hct appropriate response to PRBCs yesterday.  Objective Vitals:   01/04/19 1829 01/04/19 2040 01/04/19 2125 01/05/19 0417  BP: (!) 111/55 122/84 114/68 131/73  Pulse: (!) 115 (!) 114 (!) 111 100  Resp: 17 (!) 21 20 20   Temp: 98.5 F (36.9 C) 98.2 F (36.8 C) 98.3 F (36.8 C) 98.4 F (36.9 C)  TempSrc: Oral Oral Oral Oral  SpO2: 99% 100% 97% 98%  Weight:      Height:        Intake/Output Summary (Last 24 hours) at 01/05/2019 1120 Last data filed at 01/05/2019 0800 Gross per 24 hour  Intake 1337.46 ml  Output 2925 ml  Net -1587.54 ml    PULM  CTAB CV  RRR VASC  Left leg demarcating to mid calf, woundVACs to fasciotomy sites with good suction  Laboratory CBC    Component Value Date/Time   WBC 12.8 (H) 01/05/2019 0452   HGB 8.5 (L) 01/05/2019 0452   HCT 28.0 (L) 01/05/2019 0452   PLT 379 01/05/2019 0452    BMET    Component Value Date/Time   NA 135 01/05/2019 0452   K 4.4 01/05/2019 0452   CL 100 01/05/2019 0452   CO2 26 01/05/2019 0452   GLUCOSE 110 (H) 01/05/2019 0452   BUN 11 01/05/2019 0452   CREATININE 0.70 01/05/2019 0452   CALCIUM 8.2 (L) 01/05/2019 0452   GFRNONAA >60 01/05/2019 0452   GFRAA >60 01/05/2019 0452    Assessment/Planning: 58 yo female with nonsalvageable left foot due to severe PAD.     OR next week for BKA v AKA  Continue hep gtt  Send CDiff if return of diarrhea  Trend labs  Rosana Berger  01/05/2019, 11:20 AM

## 2019-01-05 NOTE — Progress Notes (Signed)
Occupational Therapy Treatment Patient Details Name: [name] MRN: [id number] DOB: 10/30/1961 Today's Date: 01/05/2019    History of present illness Pt is a 58 yo female with PMHx including PVD, anxiety, GERD, DM, HTN, CRF s/p extensive LLE revascularization during this hospitalization. Angiogram shows minimal flow through the calf even after angioplasty and thrombectomy. Concern for compartment syndrome and pt now s/p LLE fasciotomies (01/01/2019).    OT comments  Reviewed handout about [facility] pt AD catalog with rec for ADLs after she has L BKA most likely on Monday.  Pt was very emotional and support provided and reassurance provided.  Pt is motivated to regain function to PLOF and discussed importance of stump care and shaping for future prosthetic limb.  Pt declined sitting up and getting OOB today due to pain and edema and wanting to protect the tissue in LLE.  Reinforced the need for OOB activities to help her progress and prevent muscle weakness and wasting which she was aware of due to having history of sepsis.  Follow Up Recommendations  SNF    Equipment Recommendations  Other (comment)(TBD)    Recommendations for Other Services      Precautions / Restrictions Precautions Precautions: Fall Precaution Comments: w/c level at baseline Restrictions Weight Bearing Restrictions: Yes RLE Weight Bearing: Weight bearing as tolerated LLE Weight Bearing: Weight bearing as tolerated Other Position/Activity Restrictions: LLE WBAT per verbal order 01/03/19 from Dr. Wyn Quaker       Mobility Bed Mobility                  Transfers                      Balance                                           ADL either performed or assessed with clinical judgement   ADL Overall ADL's : Needs assistance/impaired                                       General ADL Comments: Reviewed handout about [facility] pt AD  catalog with rec for ADLs after she has L BKA most likely on Monday.  Pt was very emotional and support provided and reassurance provided.  Pt is motivated to regain function to PLOF and discussed importance of stump care and shaping for future prosthetic limb.  Pt declined sitting up and getting OOB today due to pain and edema and wanting to protect the tissue in LLE.  Reinforced the need for OOB activities to help her progress and prevent muscle weakness and wasting.       Vision Baseline Vision/History: Wears glasses Wears Glasses: At all times Patient Visual Report: No change from baseline     Perception     Praxis      Cognition Arousal/Alertness: Awake/alert Behavior During Therapy: WFL for tasks assessed/performed Overall Cognitive Status: Within Functional Limits for tasks assessed                                          Exercises     Shoulder Instructions  General Comments      Pertinent Vitals/ Pain       Pain Assessment: No/denies pain(pt complaining of discomfort and throbbing but not pain in LLE )  Home Living Family/patient expects to be discharged to:: Private residence Living Arrangements: Alone Available Help at Discharge: Family;Available PRN/intermittently                                    Prior Functioning/Environment              Frequency  Min 1X/week        Progress Toward Goals  OT Goals(current goals can now be found in the care plan section)  Progress towards OT goals: Progressing toward goals  Acute Rehab OT Goals Patient Stated Goal: to get my leg healed and go back home OT Goal Formulation: With patient Time For Goal Achievement: 01/16/19 Potential to Achieve Goals: Good  Plan Discharge plan remains appropriate    Co-evaluation                 AM-PAC OT "6 Clicks" Daily Activity     Outcome Measure   Help from another person eating meals?: None Help from another person  taking care of personal grooming?: None Help from another person toileting, which includes using toliet, bedpan, or urinal?: A Lot Help from another person bathing (including washing, rinsing, drying)?: A Lot Help from another person to put on and taking off regular upper body clothing?: A Little Help from another person to put on and taking off regular lower body clothing?: A Lot 6 Click Score: 17    End of Session    OT Visit Diagnosis: Other abnormalities of gait and mobility (R26.89)   Activity Tolerance Patient tolerated treatment well   Patient Left in bed;with call bell/phone within reach;Other (comment)   Nurse Communication          Time: 0177-9390 OT Time Calculation (min): 25 min  Charges: OT General Charges $OT Visit: 1 Visit OT Treatments $Self Care/Home Management : 23-37 mins  Susanne Borders, OTR/L ascom [phone] 01/05/19, 11:58 AM

## 2019-01-06 LAB — CBC
HCT: 26.3 % — ABNORMAL LOW (ref 36.0–46.0)
HEMOGLOBIN: 8.2 g/dL — AB (ref 12.0–15.0)
MCH: 26.6 pg (ref 26.0–34.0)
MCHC: 31.2 g/dL (ref 30.0–36.0)
MCV: 85.4 fL (ref 80.0–100.0)
Platelets: 355 10*3/uL (ref 150–400)
RBC: 3.08 MIL/uL — ABNORMAL LOW (ref 3.87–5.11)
RDW: 17.6 % — ABNORMAL HIGH (ref 11.5–15.5)
WBC: 11.7 10*3/uL — ABNORMAL HIGH (ref 4.0–10.5)
nRBC: 0 % (ref 0.0–0.2)

## 2019-01-06 LAB — MAGNESIUM: MAGNESIUM: 1.3 mg/dL — AB (ref 1.7–2.4)

## 2019-01-06 LAB — BASIC METABOLIC PANEL
Anion gap: 7 (ref 5–15)
BUN: 10 mg/dL (ref 6–20)
CHLORIDE: 100 mmol/L (ref 98–111)
CO2: 27 mmol/L (ref 22–32)
Calcium: 7.9 mg/dL — ABNORMAL LOW (ref 8.9–10.3)
Creatinine, Ser: 0.75 mg/dL (ref 0.44–1.00)
GFR calc Af Amer: >60 mL/min (ref >60)
GFR calc non Af Amer: >60 mL/min (ref >60)
Glucose, Bld: 138 mg/dL — ABNORMAL HIGH (ref 70–99)
Potassium: 4.2 mmol/L (ref 3.5–5.1)
Sodium: 134 mmol/L — ABNORMAL LOW (ref 135–145)

## 2019-01-06 LAB — GLUCOSE, CAPILLARY
GLUCOSE-CAPILLARY: 169 mg/dL — AB (ref 70–99)
Glucose-Capillary: 119 mg/dL — ABNORMAL HIGH (ref 70–99)
Glucose-Capillary: 111 mg/dL — ABNORMAL HIGH (ref 70–99)
Glucose-Capillary: 157 mg/dL — ABNORMAL HIGH (ref 70–99)

## 2019-01-06 LAB — HEPARIN LEVEL (UNFRACTIONATED): Heparin Unfractionated: 0.37 IU/mL (ref 0.30–0.70)

## 2019-01-06 MED ORDER — CHLORHEXIDINE GLUCONATE CLOTH 2 % EX PADS
6.0000 | MEDICATED_PAD | Freq: Once | CUTANEOUS | Status: AC
  Administered 2019-01-07: 6 via TOPICAL

## 2019-01-06 MED ORDER — NICOTINE 14 MG/24HR TD PT24
14.0000 mg | MEDICATED_PATCH | Freq: Every day | TRANSDERMAL | Status: DC
  Filled 2019-01-06 (×6): qty 1

## 2019-01-06 MED ORDER — OXYCODONE HCL ER 10 MG PO T12A
10.0000 mg | EXTENDED_RELEASE_TABLET | Freq: Two times a day (BID) | ORAL | Status: DC
  Administered 2019-01-06 – 2019-01-07 (×3): 10 mg via ORAL
  Filled 2019-01-06 (×3): qty 1

## 2019-01-06 MED ORDER — MAGNESIUM SULFATE 4 GM/100ML IV SOLN
4.0000 g | Freq: Once | INTRAVENOUS | Status: AC
  Administered 2019-01-06: 4 g via INTRAVENOUS
  Filled 2019-01-06: qty 100

## 2019-01-06 NOTE — Consult Note (Signed)
ANTICOAGULATION CONSULT NOTE  Pharmacy Consult for Heparin Drip  Indication: Ischemic left leg   Patient Measurements: Height: 5\' 3"  (160 cm) Weight: 277 lb 9 oz (125.9 kg) IBW/kg (Calculated) : 52.4 Heparin Dosing Weight: 80 kg  Vital Signs: Temp: 98.5 F (36.9 C) (01/12 0523) Temp Source: Oral (01/12 0523) BP: 116/69 (01/12 0523) Pulse Rate: 103 (01/12 0523)  Labs: Recent Labs    01/04/19 0552  01/04/19 2148 01/05/19 0452 01/06/19 0433  HGB 7.2*  --   --  8.5* 8.2*  HCT 24.1*  --   --  28.0* 26.3*  PLT 395  --   --  379 355  LABPROT 14.4  --   --   --   --   INR 1.13  --   --   --   --   HEPARINUNFRC 0.28*   < > 0.46 0.48 0.37  CREATININE 0.73  --   --  0.70 0.75   < > = values in this interval not displayed.    Estimated Creatinine Clearance: 101.4 mL/min (by C-G formula based on SCr of 0.75 mg/dL).   Assessment: Pharmacy consulted for heparin drip dosing and monitoring in 58 yo female with ischemic left leg. Patient S/P angioplasty,  mechanical thrombectomy and fasciotomy. Patient has clots in distal arteries of lower extremity.  Heparin was started on 1/7 at 600 units/hr with no level drawn and stopped soon afterward due to bleeding  Goal of Therapy:  Heparin level 0.3-0.7 units/ml Monitor platelets by anticoagulation protocol: Yes   Heparin course: (no bolus per Dr Wyn Quaker) 1/8 pm restarted at 800 units/hr 1/8 2203 HL<0.10 rate increased to 1100 units/hr 1/9 0731 HL<0.10 1/9 2100 HL 0.11. Increase to 1700 units/hr. No bolus per consult. Recheck heparin level and CBC with AM labs.  1/10 AM heparin level 0.28. Increase to 1850 units/hr and recheck in 6 hours.   Plan:  Per Dr Wyn Quaker, No Bolus.  1/10 1405 HL 0.36, therapeutic x1. Continue at current rate. Recheck in 6h. Check anti-Xa level in 6 hours and daily while on heparin Continue to monitor H&H and platelets  1/10 PM heparin level 0.46. Continue current regimen. Recheck heparin level and CBC with tomorrow  AM labs.  1/11 AM heparin level 0.48. Continue current regimen. Recheck heparin level and CBC with tomorrow AM labs.  1/12 AM heparin level 0.37. Continue current regimen. Recheck heparin level and CBC with tomorrow AM labs.  Erich Montane, PharmD Clinical Pharmacist 01/06/2019 6:40 AM

## 2019-01-06 NOTE — Progress Notes (Signed)
[facility]  Daily Progress Note   Subjective  - 5 Days Post-Op  No issues overnight.  Pain not well controlled overnight and this morning.  Requesting to go across the street to smoke a cigarette.  Objective Vitals:   01/05/19 1325 01/05/19 2008 01/06/19 0523 01/06/19 1156  BP: 121/62 (!) 113/57 116/69 (!) 141/64  Pulse: (!) 103 (!) 114 (!) 103 (!) 102  Resp:  20 20 18   Temp: 98.5 F (36.9 C) 98.5 F (36.9 C) 98.5 F (36.9 C) 98.1 F (36.7 C)  TempSrc: Oral Oral Oral Oral  SpO2: 100% 97% 96% 100%  Weight:   125.9 kg   Height:        Intake/Output Summary (Last 24 hours) at 01/06/2019 1207 Last data filed at 01/06/2019 0800 Gross per 24 hour  Intake 1127.32 ml  Output 900 ml  Net 227.32 ml    PULM  CTAB CV  RRR VASC  Left leg demarcating to mid calf, woundVACs to fasciotomy sites with good suction  Laboratory CBC    Component Value Date/Time   WBC 11.7 (H) 01/06/2019 0433   HGB 8.2 (L) 01/06/2019 0433   HCT 26.3 (L) 01/06/2019 0433   PLT 355 01/06/2019 0433    BMET    Component Value Date/Time   NA 134 (L) 01/06/2019 0433   K 4.2 01/06/2019 0433   CL 100 01/06/2019 0433   CO2 27 01/06/2019 0433   GLUCOSE 138 (H) 01/06/2019 0433   BUN 10 01/06/2019 0433   CREATININE 0.75 01/06/2019 0433   CALCIUM 7.9 (L) 01/06/2019 0433   GFRNONAA >60 01/06/2019 0433   GFRAA >60 01/06/2019 0433    Assessment/Planning: 58 yo female with nonsalvageable left foot due to severe PAD.     OR tomorrow for BKA vs AKA, NPO after MN  Continue heparin gtt  Add long acting oxy for pain control until source removed  Nicoderm patch   Raine Elsass E Lupe Handley  01/06/2019, 12:07 PM

## 2019-01-06 NOTE — Plan of Care (Signed)
Patient continues to struggle with uncontrolled pain and anxiety. PRNs given with some effectiveness.   Madie RenoMisty D Aasha Dina, RN

## 2019-01-07 ENCOUNTER — Inpatient Hospital Stay: Payer: Medicare HMO | Admitting: Registered Nurse

## 2019-01-07 ENCOUNTER — Encounter (INDEPENDENT_AMBULATORY_CARE_PROVIDER_SITE_OTHER): Payer: Medicare HMO

## 2019-01-07 ENCOUNTER — Ambulatory Visit (INDEPENDENT_AMBULATORY_CARE_PROVIDER_SITE_OTHER): Payer: Medicare HMO | Admitting: Nurse Practitioner

## 2019-01-07 ENCOUNTER — Encounter
Admission: AD | Disposition: A | Discharge status: Skilled Nursing Facility | Payer: Self-pay | Source: Home / Self Care | Attending: Vascular Surgery

## 2019-01-07 ENCOUNTER — Encounter: Payer: Self-pay | Admitting: Anesthesiology

## 2019-01-07 DIAGNOSIS — I96 Gangrene, not elsewhere classified: Secondary | ICD-10-CM

## 2019-01-07 HISTORY — PX: AMPUTATION: SHX166

## 2019-01-07 LAB — GLUCOSE, CAPILLARY
GLUCOSE-CAPILLARY: 272 mg/dL — AB (ref 70–99)
Glucose-Capillary: 138 mg/dL — ABNORMAL HIGH (ref 70–99)
Glucose-Capillary: 99 mg/dL (ref 70–99)
Glucose-Capillary: 79 mg/dL (ref 70–99)
Glucose-Capillary: 103 mg/dL — ABNORMAL HIGH (ref 70–99)
Glucose-Capillary: 121 mg/dL — ABNORMAL HIGH (ref 70–99)

## 2019-01-07 LAB — CBC
HCT: 28.3 % — ABNORMAL LOW (ref 36.0–46.0)
Hemoglobin: 8.2 g/dL — ABNORMAL LOW (ref 12.0–15.0)
MCH: 25.4 pg — ABNORMAL LOW (ref 26.0–34.0)
MCHC: 29 g/dL — ABNORMAL LOW (ref 30.0–36.0)
MCV: 87.6 fL (ref 80.0–100.0)
NRBC: 0 % (ref 0.0–0.2)
PLATELETS: 344 10*3/uL (ref 150–400)
RBC: 3.23 MIL/uL — ABNORMAL LOW (ref 3.87–5.11)
RDW: 17.6 % — ABNORMAL HIGH (ref 11.5–15.5)
WBC: 11.2 10*3/uL — ABNORMAL HIGH (ref 4.0–10.5)

## 2019-01-07 LAB — BASIC METABOLIC PANEL
ANION GAP: 8 (ref 5–15)
BUN: 8 mg/dL (ref 6–20)
CALCIUM: 8.1 mg/dL — AB (ref 8.9–10.3)
CO2: 29 mmol/L (ref 22–32)
Chloride: 98 mmol/L (ref 98–111)
Creatinine, Ser: 0.67 mg/dL (ref 0.44–1.00)
GFR calc Af Amer: >60 mL/min (ref >60)
GFR calc non Af Amer: >60 mL/min (ref >60)
Glucose, Bld: 154 mg/dL — ABNORMAL HIGH (ref 70–99)
Potassium: 4.1 mmol/L (ref 3.5–5.1)
Sodium: 135 mmol/L (ref 135–145)

## 2019-01-07 LAB — PROTIME-INR
INR: 1.01
Prothrombin Time: 13.2 seconds (ref 11.4–15.2)

## 2019-01-07 LAB — MAGNESIUM: Magnesium: 1.5 mg/dL — ABNORMAL LOW (ref 1.7–2.4)

## 2019-01-07 LAB — HEPARIN LEVEL (UNFRACTIONATED): Heparin Unfractionated: 0.35 IU/mL (ref 0.30–0.70)

## 2019-01-07 SURGERY — AMPUTATION BELOW KNEE
Anesthesia: General | Site: Leg Lower | Laterality: Left

## 2019-01-07 MED ORDER — FENTANYL CITRATE (PF) 100 MCG/2ML IJ SOLN
INTRAMUSCULAR | Status: DC | PRN
  Administered 2019-01-07: 25 ug via INTRAVENOUS
  Administered 2019-01-07 (×2): 50 ug via INTRAVENOUS

## 2019-01-07 MED ORDER — ONDANSETRON HCL 4 MG/2ML IJ SOLN
INTRAMUSCULAR | Status: AC
  Filled 2019-01-07: qty 2

## 2019-01-07 MED ORDER — HYDROMORPHONE HCL 1 MG/ML IJ SOLN
0.5000 mg | INTRAMUSCULAR | Status: AC | PRN
  Administered 2019-01-07 (×4): 0.5 mg via INTRAVENOUS

## 2019-01-07 MED ORDER — CEFAZOLIN SODIUM-DEXTROSE 2-4 GM/100ML-% IV SOLN
INTRAVENOUS | Status: AC
  Filled 2019-01-07: qty 100

## 2019-01-07 MED ORDER — FENTANYL CITRATE (PF) 100 MCG/2ML IJ SOLN
INTRAMUSCULAR | Status: AC
  Administered 2019-01-07: 25 ug via INTRAVENOUS
  Filled 2019-01-07: qty 2

## 2019-01-07 MED ORDER — ALBUTEROL SULFATE HFA 108 (90 BASE) MCG/ACT IN AERS
INHALATION_SPRAY | RESPIRATORY_TRACT | Status: AC
  Filled 2019-01-07: qty 6.7

## 2019-01-07 MED ORDER — FENTANYL CITRATE (PF) 100 MCG/2ML IJ SOLN
25.0000 ug | INTRAMUSCULAR | Status: AC | PRN
  Administered 2019-01-07 (×6): 25 ug via INTRAVENOUS

## 2019-01-07 MED ORDER — MAGNESIUM SULFATE 4 GM/100ML IV SOLN
4.0000 g | Freq: Once | INTRAVENOUS | Status: AC
  Administered 2019-01-07: 4 g via INTRAVENOUS
  Filled 2019-01-07 (×2): qty 100

## 2019-01-07 MED ORDER — ONDANSETRON HCL 4 MG/2ML IJ SOLN
INTRAMUSCULAR | Status: DC | PRN
  Administered 2019-01-07: 4 mg via INTRAVENOUS

## 2019-01-07 MED ORDER — LIDOCAINE HCL (CARDIAC) PF 100 MG/5ML IV SOSY
PREFILLED_SYRINGE | INTRAVENOUS | Status: DC | PRN
  Administered 2019-01-07: 50 mg via INTRAVENOUS

## 2019-01-07 MED ORDER — ALBUTEROL SULFATE HFA 108 (90 BASE) MCG/ACT IN AERS
INHALATION_SPRAY | RESPIRATORY_TRACT | Status: DC | PRN
  Administered 2019-01-07: 5 via RESPIRATORY_TRACT

## 2019-01-07 MED ORDER — SUCCINYLCHOLINE CHLORIDE 20 MG/ML IJ SOLN
INTRAMUSCULAR | Status: DC | PRN
  Administered 2019-01-07: 100 mg via INTRAVENOUS

## 2019-01-07 MED ORDER — PHENYLEPHRINE HCL 10 MG/ML IJ SOLN
INTRAMUSCULAR | Status: DC | PRN
  Administered 2019-01-07 (×5): 100 ug via INTRAVENOUS

## 2019-01-07 MED ORDER — GLYCOPYRROLATE 0.2 MG/ML IJ SOLN
INTRAMUSCULAR | Status: DC | PRN
  Administered 2019-01-07: 0.2 mg via INTRAVENOUS

## 2019-01-07 MED ORDER — SUCCINYLCHOLINE CHLORIDE 20 MG/ML IJ SOLN
INTRAMUSCULAR | Status: AC
  Filled 2019-01-07: qty 1

## 2019-01-07 MED ORDER — ONDANSETRON HCL 4 MG/2ML IJ SOLN: 4.0000 mg | Freq: Once | INTRAMUSCULAR | Status: DC | PRN

## 2019-01-07 MED ORDER — DEXAMETHASONE SODIUM PHOSPHATE 10 MG/ML IJ SOLN
INTRAMUSCULAR | Status: AC
  Filled 2019-01-07: qty 1

## 2019-01-07 MED ORDER — FENTANYL CITRATE (PF) 100 MCG/2ML IJ SOLN
INTRAMUSCULAR | Status: AC
  Filled 2019-01-07: qty 2

## 2019-01-07 MED ORDER — HYDROMORPHONE HCL 1 MG/ML IJ SOLN
INTRAMUSCULAR | Status: AC
  Administered 2019-01-07: 0.5 mg via INTRAVENOUS
  Filled 2019-01-07: qty 1

## 2019-01-07 MED ORDER — GLYCOPYRROLATE 0.2 MG/ML IJ SOLN
INTRAMUSCULAR | Status: AC
  Filled 2019-01-07: qty 1

## 2019-01-07 MED ORDER — ENSURE MAX PROTEIN PO LIQD
11.0000 [oz_av] | Freq: Two times a day (BID) | ORAL | Status: DC
  Administered 2019-01-08 – 2019-01-17 (×12): 11 [oz_av] via ORAL
  Filled 2019-01-07: qty 330

## 2019-01-07 MED ORDER — KETAMINE HCL 50 MG/ML IJ SOLN
INTRAMUSCULAR | Status: DC | PRN
  Administered 2019-01-07: 50 mg via INTRAVENOUS

## 2019-01-07 MED ORDER — PROPOFOL 10 MG/ML IV BOLUS
INTRAVENOUS | Status: AC
  Filled 2019-01-07: qty 20

## 2019-01-07 MED ORDER — LIDOCAINE HCL (PF) 2 % IJ SOLN
INTRAMUSCULAR | Status: AC
  Filled 2019-01-07: qty 10

## 2019-01-07 MED ORDER — MIDAZOLAM HCL 2 MG/2ML IJ SOLN
INTRAMUSCULAR | Status: DC | PRN
  Administered 2019-01-07: 2 mg via INTRAVENOUS

## 2019-01-07 MED ORDER — PROPOFOL 10 MG/ML IV BOLUS
INTRAVENOUS | Status: DC | PRN
  Administered 2019-01-07: 150 mg via INTRAVENOUS

## 2019-01-07 MED ORDER — MIDAZOLAM HCL 2 MG/2ML IJ SOLN
INTRAMUSCULAR | Status: AC
  Filled 2019-01-07: qty 2

## 2019-01-07 MED ORDER — DEXAMETHASONE SODIUM PHOSPHATE 10 MG/ML IJ SOLN
INTRAMUSCULAR | Status: DC | PRN
  Administered 2019-01-07: 5 mg via INTRAVENOUS

## 2019-01-07 SURGICAL SUPPLY — 39 items
BANDAGE ELASTIC 6 LF NS (GAUZE/BANDAGES/DRESSINGS) ×3 IMPLANT
BLADE SAGITTAL WIDE XTHICK NO (BLADE) ×3 IMPLANT
BLADE SAW SAG 25X90X1.19 (BLADE) ×2 IMPLANT
BNDG COHESIVE 4X5 TAN STRL (GAUZE/BANDAGES/DRESSINGS) ×3 IMPLANT
BNDG GAUZE 4.5X4.1 6PLY STRL (MISCELLANEOUS) ×6 IMPLANT
BRUSH SCRUB EZ  4% CHG (MISCELLANEOUS) ×2
BRUSH SCRUB EZ 4% CHG (MISCELLANEOUS) ×1 IMPLANT
CANISTER SUCT 1200ML W/VALVE (MISCELLANEOUS) ×3 IMPLANT
COVER WAND RF STERILE (DRAPES) ×3 IMPLANT
DRAIN PENROSE 1/4X12 LTX (DRAIN) ×3 IMPLANT
DRAPE INCISE IOBAN 66X45 STRL (DRAPES) IMPLANT
DURAPREP 26ML APPLICATOR (WOUND CARE) ×3 IMPLANT
ELECT CAUTERY BLADE 6.4 (BLADE) ×3 IMPLANT
ELECT REM PT RETURN 9FT ADLT (ELECTROSURGICAL) ×3
ELECTRODE REM PT RTRN 9FT ADLT (ELECTROSURGICAL) ×1 IMPLANT
GAUZE PETRO XEROFOAM 1X8 (MISCELLANEOUS) ×6 IMPLANT
GLOVE BIO SURGEON STRL SZ7 (GLOVE) ×6 IMPLANT
GLOVE INDICATOR 7.5 STRL GRN (GLOVE) ×3 IMPLANT
GOWN STRL REUS W/ TWL LRG LVL3 (GOWN DISPOSABLE) ×2 IMPLANT
GOWN STRL REUS W/ TWL XL LVL3 (GOWN DISPOSABLE) ×1 IMPLANT
GOWN STRL REUS W/TWL LRG LVL3 (GOWN DISPOSABLE) ×4
GOWN STRL REUS W/TWL XL LVL3 (GOWN DISPOSABLE) ×2
HANDLE YANKAUER SUCT BULB TIP (MISCELLANEOUS) ×3 IMPLANT
KIT TURNOVER KIT A (KITS) ×3 IMPLANT
LABEL OR SOLS (LABEL) ×3 IMPLANT
NS IRRIG 1000ML POUR BTL (IV SOLUTION) ×3 IMPLANT
PACK EXTREMITY ARMC (MISCELLANEOUS) ×3 IMPLANT
PAD ABD DERMACEA PRESS 5X9 (GAUZE/BANDAGES/DRESSINGS) ×6 IMPLANT
PAD PREP 24X41 OB/GYN DISP (PERSONAL CARE ITEMS) ×3 IMPLANT
SPONGE LAP 18X18 RF (DISPOSABLE) ×3 IMPLANT
STAPLER SKIN PROX 35W (STAPLE) ×3 IMPLANT
STOCKINETTE M/LG 89821 (MISCELLANEOUS) ×3 IMPLANT
SUT SILK 2 0 (SUTURE) ×2
SUT SILK 2 0 SH (SUTURE) ×6 IMPLANT
SUT SILK 2-0 18XBRD TIE 12 (SUTURE) ×1 IMPLANT
SUT SILK 3 0 (SUTURE) ×2
SUT SILK 3-0 18XBRD TIE 12 (SUTURE) ×1 IMPLANT
SUT VIC AB 0 CT1 36 (SUTURE) ×6 IMPLANT
SUT VIC AB 2-0 CT1 (SUTURE) ×6 IMPLANT

## 2019-01-07 NOTE — Progress Notes (Signed)
Nutrition Follow Up Note   DOCUMENTATION CODES:   Morbid obesity  INTERVENTION:   Ensure Max protein supplement BID, each supplement provides 150kcal and 30g of protein.  Ocuvite daily for wound healing (provides zinc, vitamin A, vitamin C, Vitamin E, copper, and selenium)  NUTRITION DIAGNOSIS:   Increased nutrient needs related to wound healing as evidenced by increased estimated needs.  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   Pt with good appetite and oral intake; pt eating 50-100% of meals. RD will add Ensure Max to help pt meet her estimated protein needs and support post op healing. Recommend continue Ocuvite until incision healed. Per chart, pt with 26lb weight gain since admit; pt is noted to have edema. RD will continue to monitor.   Medications reviewed and include: lasix, insulin, metformin, ocuvite, nicotine, protonix, NaCl tab, oxycodone, NaCl @75ml /hr, heaprin, Mg sulfate  Labs reviewed: Mg 1.5(L) Wbc- 11.2(H), Hgb 8.2(L), Hct 28.3(L), MCH 25.4(L), MCHC 29.0(L) cbgs- 138, 99, 79 x 24 hrs  Diet Order:   Diet Order            Diet NPO time specified Except for: Sips with Meds  Diet effective midnight             EDUCATION NEEDS:   Education needs have been addressed  Skin:  Skin Assessment: Skin Integrity Issues:(wound left heel; fasciotomy wounds to both sides of left calf with wound VAC in place)  Last BM:  1/11- type 5  Height:   Ht Readings from Last 1 Encounters:   12/31/18 5\' 3"  (1.6 m)   Weight:   Wt Readings from Last 1 Encounters:  01/06/19 125.9 kg   Ideal Body Weight:  52.3 kg  BMI:  Body mass index is 49.17 kg/m.  Estimated Nutritional Needs:   Kcal:  2100-2400  Protein:  100-110 grams  Fluid:  2.1-2.4 L/day  Betsey Holiday MS, RD, LDN Pager #- [phone] Office#- [phone] After Hours Pager: [phone]

## 2019-01-07 NOTE — Transfer of Care (Signed)
Immediate Anesthesia Transfer of Care Note  Patient: [name]  Procedure(s) Performed: AMPUTATION BELOW KNEE (Left Leg Lower)  Patient Location: PACU  Anesthesia Type:General  Level of Consciousness: sedated  Airway & Oxygen Therapy: Patient Spontanous Breathing and Patient connected to face mask oxygen  Post-op Assessment: Report given to RN and Post -op Vital signs reviewed and stable  Post vital signs: Reviewed and stable  Last Vitals:  Vitals Value Taken Time  BP 105/62 01/07/2019  4:03 PM  Temp 36.4 C 01/07/2019  4:03 PM  Pulse 108 01/07/2019  4:04 PM  Resp 14 01/07/2019  4:04 PM  SpO2 100 % 01/07/2019  4:04 PM  Vitals shown include unvalidated device data.  Last Pain:  Vitals:   01/07/19 1400  TempSrc: Tympanic  PainSc: 8       Patients Stated Pain Goal: 3 (01/07/19 1400)  Complications: No apparent anesthesia complications

## 2019-01-07 NOTE — Anesthesia Procedure Notes (Signed)
Procedure Name: Intubation Performed by: Mathews Argyle, CRNA Pre-anesthesia Checklist: Patient identified, Patient being monitored, Timeout performed, Emergency Drugs available and Suction available Patient Re-evaluated:Patient Re-evaluated prior to induction Oxygen Delivery Method: Circle system utilized Preoxygenation: Pre-oxygenation with 100% oxygen Induction Type: IV induction and Rapid sequence Ventilation: Mask ventilation without difficulty Laryngoscope Size: Miller and 2 Grade View: Grade II Tube type: Oral Tube size: 7.0 mm Number of attempts: 1 Airway Equipment and Method: Stylet and Patient positioned with wedge pillow Placement Confirmation: ETT inserted through vocal cords under direct vision,  positive ETCO2 and breath sounds checked- equal and bilateral Secured at: 21 cm Tube secured with: Tape Dental Injury: Teeth and Oropharynx as per pre-operative assessment

## 2019-01-07 NOTE — Op Note (Signed)
OPERATIVE NOTE   PROCEDURE: Left below-the-knee amputation  PRE-OPERATIVE DIAGNOSIS: Left foot gangrene  POST-OPERATIVE DIAGNOSIS: same as above  SURGEON: Festus Barren, MD  ASSISTANT(S): Raul Del, PA-C  ANESTHESIA: general  ESTIMATED BLOOD LOSS: 100 cc  FINDING(S): none  SPECIMEN(S):  Left below-the-knee amputation  INDICATIONS:   [name] is a 58 y.o. female who presents with left leg gangrene.  The patient is scheduled for a left below-the-knee amputation.  I discussed in depth with the patient the risks, benefits, and alternatives to this procedure.  The patient is aware that the risk of this operation included but are not limited to:  bleeding, infection, myocardial infarction, stroke, death, failure to heal amputation wound, and possible need for more proximal amputation.  The patient is aware of the risks and agrees proceed forward with the procedure. An assistant was present during the procedure to help facilitate the exposure and expedite the procedure.  DESCRIPTION:  After full informed written consent was obtained from the patient, the patient was brought back to the operating room, and placed supine upon the operating table.  Prior to induction, the patient received IV antibiotics.  The patient was then prepped and draped in the standard fashion for a below-the-knee amputation.  The assistant provided retraction and mobilization to help facilitate exposure and expedite the procedure throughout the entire procedure.  This included following suture, using retractors, and optimizing lighting. After obtaining adequate anesthesia, the patient was prepped and draped in the standard fashion for a left below-the-knee amputation.  I marked out the anterior incision two finger breadths below the tibial tuberosity and then the marked out a posterior flap that was one third of the circumference of the calf in length.   I made the incisions for these flaps, and then dissected  through the subcutaneous tissue, fascia, and muscle anteriorly.  I elevated  the periosteal tissue superiorly so that the tibia was about 3-4 cm shorter than the anterior skin flap.  I then transected the tibia with a power saw and then took a wedge off the tibia anteriorly with the power saw.  Then I smoothed out the rough edges.  In a similar fashion, I cut back the fibula about two centimeters higher than the level of the tibia with a bone cutter.  I put a bone hook into the distal tibia and then used a large amputation knife to sharply develop a tissue plane through the muscle along the fibula.  In such fashion, the posterior flap was developed.  At this point, the specimen was passed off the field as the below-the-knee amputation.  At this point, I clamped all visibly bleeding arteries and veins using a combination of suture ligation with Silk suture and electrocautery.  Bleeding continued to be controlled with electrocautery and suture ligature.  The stump was washed off with sterile normal saline and no further active bleeding was noted.  I reapproximated the anterior and posterior fascia  with interrupted stitches of 0 Vicryl.  This was completed along the entire length of anterior and posterior fascia until there were no more loose space in the fascial line. I then placed a layer of 2-0 Vicryl sutures in the subcutaneous tissue. The skin was then  reapproximated with staples.  The stump was washed off and dried.  The incision was dressed with Xeroform and  then fluffs were applied.  Kerlix was wrapped around the leg and then gently an ACE wrap was applied.    COMPLICATIONS: none  CONDITION: stable   Festus BarrenJason   01/07/2019, 3:48 PM    This note was created with Dragon Medical transcription system. Any errors in dictation are purely unintentional.

## 2019-01-07 NOTE — Progress Notes (Signed)
Occupational Therapy Treatment Patient Details Name: [name] Tidmore MRN: [id number] DOB: 1961/01/19 Today's Date: 01/07/2019    History of present illness Pt is a 58 yo female with PMHx including PVD, anxiety, GERD, DM, HTN, CRF s/p extensive LLE revascularization during this hospitalization. Angiogram shows minimal flow through the calf even after angioplasty and thrombectomy. Concern for compartment syndrome and pt now s/p LLE fasciotomies (01/01/2019).  Plan for LLE BKA vs AKA on 01/07/2019 per MD note.   OT comments  Pt seen for OT tx this date. Pt educated in distraction techniques and pursed lip breathing to support improved self mgt of stress and pain, pt verbablized understanding and able to return demo PLB. Mobility deferred 2/2 sign on whiteboard indicating bed rest. Per MD note, anticipate LLE BKA vs AKA this afternoon. Pt intermittently tearful during session 2/2 "family issues" with her brother. Pt eager to speak with chaplain and requested OT to let RN know that she would ideally like to see chaplain prior to 12pm when pt's mother is to arrive. OT notified RN at end of session. Will plan to re-evaluate pt as appropriate after surgery and pending continue at transfer in OT order (or new order).    Follow Up Recommendations  SNF    Equipment Recommendations  Other (comment)(TBD)    Recommendations for Other Services      Precautions / Restrictions Precautions Precautions: Fall Precaution Comments: w/c level at baseline Restrictions Weight Bearing Restrictions: Yes RLE Weight Bearing: Weight bearing as tolerated LLE Weight Bearing: Non weight bearing Other Position/Activity Restrictions: LLE WBAT per verbal order 01/03/19 from Dr. Wyn Quaker       Mobility Bed Mobility               General bed mobility comments: deferred, per whiteboard in room - pt on bed rest, plan for LLE BKA vs AKA this afternoon  Transfers                 General transfer comment: deferred, per  whiteboard in room - pt on bed rest, plan for LLE BKA vs AKA this afternoon    Balance                                           ADL either performed or assessed with clinical judgement   ADL                                               Vision Baseline Vision/History: Wears glasses Wears Glasses: At all times Patient Visual Report: No change from baseline     Perception     Praxis      Cognition Arousal/Alertness: Awake/alert Behavior During Therapy: WFL for tasks assessed/performed Overall Cognitive Status: Within Functional Limits for tasks assessed                                 General Comments: pt tearful        Exercises Other Exercises Other Exercises: pt educated in distraction techniques and pursed lip breathing to support improved self mgt of stress and pain, pt verbablized understanding and able to return demo PLB   Shoulder Instructions  General Comments      Pertinent Vitals/ Pain       Pain Assessment: No/denies pain  Home Living                                          Prior Functioning/Environment              Frequency  Min 1X/week        Progress Toward Goals  OT Goals(current goals can now be found in the care plan section)  Progress towards OT goals: Progressing toward goals  Acute Rehab OT Goals Patient Stated Goal: to get my leg healed and go back home OT Goal Formulation: With patient Time For Goal Achievement: 01/16/19 Potential to Achieve Goals: Good  Plan Discharge plan remains appropriate;Frequency remains appropriate    Co-evaluation                 AM-PAC OT "6 Clicks" Daily Activity     Outcome Measure   Help from another person eating meals?: None Help from another person taking care of personal grooming?: None Help from another person toileting, which includes using toliet, bedpan, or urinal?: A Lot Help from another person  bathing (including washing, rinsing, drying)?: A Lot Help from another person to put on and taking off regular upper body clothing?: A Little Help from another person to put on and taking off regular lower body clothing?: A Lot 6 Click Score: 17    End of Session    OT Visit Diagnosis: Other abnormalities of gait and mobility (R26.89)   Activity Tolerance Patient tolerated treatment well   Patient Left in bed;with call bell/phone within reach;with bed alarm set   Nurse Communication          Time: [id number] OT Time Calculation (min): 23 min  Charges: OT General Charges $OT Visit: 1 Visit OT Treatments $Therapeutic Activity: 23-37 mins   Richrd PrimeJamie Stiller, MPH, MS, OTR/L ascom [phone] 01/07/19, 11:08 AM

## 2019-01-07 NOTE — Consult Note (Signed)
ANTICOAGULATION CONSULT NOTE  Pharmacy Consult for Heparin Drip  Indication: Ischemic left leg   Patient Measurements: Height: 5\' 3"  (160 cm) Weight: 277 lb 9 oz (125.9 kg) IBW/kg (Calculated) : 52.4 Heparin Dosing Weight: 80 kg  Vital Signs: Temp: 98.2 F (36.8 C) (01/13 0535) Temp Source: Oral (01/13 0535) BP: 135/79 (01/13 0535) Pulse Rate: 97 (01/13 0535)  Labs: Recent Labs    01/05/19 0452 01/06/19 0433 01/07/19 0426  HGB 8.5* 8.2* 8.2*  HCT 28.0* 26.3* 28.3*  PLT 379 355 344  LABPROT  --   --  13.2  INR  --   --  1.01  HEPARINUNFRC 0.48 0.37 0.35  CREATININE 0.70 0.75 0.67    Estimated Creatinine Clearance: 101.4 mL/min (by C-G formula based on SCr of 0.67 mg/dL).   Assessment: Pharmacy consulted for heparin drip dosing and monitoring in 58 yo female with ischemic left leg. Patient S/P angioplasty,  mechanical thrombectomy and fasciotomy. Patient has clots in distal arteries of lower extremity.  Heparin was started on 1/7 at 600 units/hr with no level drawn and stopped soon afterward due to bleeding  Goal of Therapy:  Heparin level 0.3-0.7 units/ml Monitor platelets by anticoagulation protocol: Yes   Heparin course: (no bolus per Dr Wyn Quaker) 1/8 pm restarted at 800 units/hr 1/8 2203 HL<0.10 rate increased to 1100 units/hr 1/9 0731 HL<0.10 1/9 2100 HL 0.11. Increase to 1700 units/hr. No bolus per consult. Recheck heparin level and CBC with AM labs.  1/10 AM heparin level 0.28. Increase to 1850 units/hr and recheck in 6 hours.   Plan:  Per Dr Wyn Quaker, No Bolus.  1/10 1405 HL 0.36, therapeutic x1. Continue at current rate. Recheck in 6h. Check anti-Xa level in 6 hours and daily while on heparin Continue to monitor H&H and platelets  1/10 PM heparin level 0.46. Continue current regimen. Recheck heparin level and CBC with tomorrow AM labs.  1/11 AM heparin level 0.48. Continue current regimen. Recheck heparin level and CBC with tomorrow AM labs.  1/12 AM heparin  level 0.37. Continue current regimen. Recheck heparin level and CBC with tomorrow AM labs.  1/13 AM heparin level 0.35. Continue current regimen. Recheck heparin level and CBC with tomorrow AM labs.  Erich Montane, PharmD Clinical Pharmacist 01/07/2019 7:24 AM

## 2019-01-07 NOTE — Progress Notes (Signed)
   01/07/19 1052  Clinical Encounter Type  Visited With Patient  Visit Type Spiritual support;Social support;Pre-op  Referral From Physician  Consult/Referral To Chaplain  Recommendations f/u post surg w/ pt   Spiritual Encounters  Spiritual Needs Sacred text;Prayer;Emotional;Grief support  Stress Factors  Patient Stress Factors Family relationships;Loss;Loss of control;Major life changes  Family Stress Factors Family relationships;Health changes;Loss  Advance Directives (For Healthcare)  Does Patient Have a Medical Advance Directive? No  Would patient like information on creating a medical advance directive? No - Patient declined  Mental Health Advance Directives  Does Patient Have a Mental Health Advance Directive? No  Would patient like information on creating a mental health advance directive? No - Patient declined  Pt. requested a visit from chaplain before she had to go to surgery. Pt shared that she was concerned about her brother not being present at the surgery. Pt also shared that they have been distant for a long time and it has weighed on both her and her mother who has been ill for several years post-death of father. Chaplain allowed pt to lament and petition to God for a miracle that they would reconcile soon. Chaplain allowed pt to express her concerns, anger, and worries as she became tearful. Pt. Was encouraged with the paraphrasing of Prov 3:5-6 and Matt 11:29-30 to no longer feel the ned to "fix it" but to trust that God is in control. Pt was led to pray out loud with chaplain present. Pt would be open to f/u care post-surg.

## 2019-01-07 NOTE — Anesthesia Post-op Follow-up Note (Signed)
Anesthesia QCDR form completed.        

## 2019-01-07 NOTE — Anesthesia Postprocedure Evaluation (Signed)
Anesthesia Post Note  Patient: [name]  Procedure(s) Performed: AMPUTATION BELOW KNEE (Left Leg Lower)  Patient location during evaluation: PACU Anesthesia Type: General Level of consciousness: awake and alert and oriented Pain management: pain level controlled Vital Signs Assessment: post-procedure vital signs reviewed and stable Respiratory status: spontaneous breathing Cardiovascular status: blood pressure returned to baseline Anesthetic complications: no     Last Vitals:  Vitals:   01/07/19 1648 01/07/19 1654  BP: 125/72   Pulse: (!) 102 (!) 101  Resp: 13 20  Temp:    SpO2: 96% 97%    Last Pain:  Vitals:   01/07/19 1654  TempSrc:   PainSc: 8                  Taurus Willis

## 2019-01-07 NOTE — Anesthesia Preprocedure Evaluation (Signed)
Anesthesia Evaluation  Patient identified by MRN, date of birth, ID band Patient awake    Reviewed: Allergy & Precautions, H&P , NPO status , Patient's Chart, lab work & pertinent test results, reviewed documented beta blocker date and time   Airway Mallampati: II  TM Distance: >3 FB Neck ROM: full    Dental  (+) Teeth Intact   Pulmonary neg pulmonary ROS, asthma , Current Smoker,    Pulmonary exam normal        Cardiovascular Exercise Tolerance: Poor hypertension, On Medications + Peripheral Vascular Disease  negative cardio ROS Normal cardiovascular exam Rate:Normal     Neuro/Psych Anxiety  Neuromuscular disease negative psych ROS   GI/Hepatic negative GI ROS, Neg liver ROS, GERD  Medicated,  Endo/Other  negative endocrine ROSdiabetes, Poorly Controlled, Type 1, Insulin Dependent  Renal/GU CRFRenal disease  negative genitourinary   Musculoskeletal   Abdominal   Peds  Hematology negative hematology ROS (+)   Anesthesia Other Findings   Reproductive/Obstetrics negative OB ROS                             Anesthesia Physical  Anesthesia Plan  ASA: III and emergent  Anesthesia Plan: General   Post-op Pain Management:    Induction:   PONV Risk Score and Plan:   Airway Management Planned: Oral ETT  Additional Equipment:   Intra-op Plan:   Post-operative Plan: Extubation in OR  Informed Consent: I have reviewed the patients History and Physical, chart, labs and discussed the procedure including the risks, benefits and alternatives for the proposed anesthesia with the patient or authorized representative who has indicated his/her understanding and acceptance.     Plan Discussed with: CRNA and Surgeon  Anesthesia Plan Comments:         Anesthesia Quick Evaluation

## 2019-01-07 NOTE — Consult Note (Signed)
ANTICOAGULATION CONSULT NOTE  Pharmacy Consult for Heparin Drip  Indication: Ischemic left leg   Patient Measurements: Height: 5\' 3"  (160 cm) Weight: 277 lb 9 oz (125.9 kg) IBW/kg (Calculated) : 52.4 Heparin Dosing Weight: 80 kg  Vital Signs: Temp: 97.9 F (36.6 C) (01/13 1823) Temp Source: Oral (01/13 1823) BP: 133/98 (01/13 1823) Pulse Rate: 105 (01/13 1823)  Labs: Recent Labs    01/05/19 0452 01/06/19 0433 01/07/19 0426  HGB 8.5* 8.2* 8.2*  HCT 28.0* 26.3* 28.3*  PLT 379 355 344  LABPROT  --   --  13.2  INR  --   --  1.01  HEPARINUNFRC 0.48 0.37 0.35  CREATININE 0.70 0.75 0.67    Estimated Creatinine Clearance: 101.4 mL/min (by C-G formula based on SCr of 0.67 mg/dL).   Assessment: Pharmacy consulted for heparin drip dosing and monitoring in 58 yo female with ischemic left leg. Patient S/P angioplasty,  mechanical thrombectomy and fasciotomy. Patient has clots in distal arteries of lower extremity.  Heparin was started on 1/7 at 600 units/hr with no level drawn and stopped soon afterward due to bleeding  Goal of Therapy:  Heparin level 0.3-0.7 units/ml Monitor platelets by anticoagulation protocol: Yes   Heparin course: (no bolus per Dr Wyn Quaker) 1/8 pm restarted at 800 units/hr 1/8 2203 HL<0.10 rate increased to 1100 units/hr 1/9 0731 HL<0.10 1/9 2100 HL 0.11. Increase to 1700 units/hr. No bolus per consult. Recheck heparin level and CBC with AM labs.  1/10 AM heparin level 0.28. Increase to 1850 units/hr and recheck in 6 hours.   Plan:  1/13 Patient was in a procedure with the heparin infusion being held.  Per RN, heparin is being restarted at 1830.  Will draw heparin level every 6 hours.  Orinda Kenner, PharmD Clinical Pharmacist 01/07/2019 6:28 PM

## 2019-01-07 NOTE — Progress Notes (Signed)
Pts heparin stopped by RN per verbal order from Dr. Wyn Quaker.

## 2019-01-07 NOTE — H&P (Signed)
[facility] History & Physical Update  The patient was interviewed and re-examined.  The patient's previous History and Physical has been reviewed and is unchanged.  There is no change in the plan of care. We plan to proceed with the scheduled procedure which is a left below-knee amputation unless the tissue was clearly unsalvageable at that level at which time an above-knee amputation would be performed.Festus Barren, MD  01/07/2019, 2:02 PM

## 2019-01-07 NOTE — Care Management Important Message (Signed)
Copy of signed Medicare IM left with patient in room. 

## 2019-01-07 NOTE — Consult Note (Signed)
PHARMACY CONSULT NOTE - FOLLOW UP  Pharmacy Consult for Electrolyte Monitoring and Replacement   Recent Labs: Potassium (mmol/L)  Date Value  01/07/2019 4.1   Magnesium (mg/dL)  Date Value  49/67/5916 1.5 (L)   Calcium (mg/dL)  Date Value  38/46/6599 8.1 (L)   Albumin (g/dL)  Date Value  35/70/1779 1.2 (L)   Phosphorus (mg/dL)  Date Value  39/02/91 3.4   Sodium (mmol/L)  Date Value  01/07/2019 135   Correct Calcium: 10.3  Assessment: Pharmacy consulted for electrolyte monitoring and replacement in 58 yo female admitted with LLE pain and ulceration. Patient had hypomagnesemia and hypokalemia on admission.   Goal of Therapy:  Electrolytes WNL   Plan:  Magnesium is under normal limits and being replaced today with 4 grams IV magnesium sulfate given dose history/response. All other electrolytes wnl.  Will F/U up with AM labs on 1/14 and continue to replace as needed.   Lowella Bandy, PharmD Clinical Pharmacist 01/07/2019 11:25 AM

## 2019-01-08 ENCOUNTER — Encounter (INDEPENDENT_AMBULATORY_CARE_PROVIDER_SITE_OTHER): Payer: Medicare HMO

## 2019-01-08 ENCOUNTER — Encounter: Payer: Self-pay | Admitting: Vascular Surgery

## 2019-01-08 ENCOUNTER — Ambulatory Visit: Payer: Medicare HMO

## 2019-01-08 LAB — MAGNESIUM: Magnesium: 2 mg/dL (ref 1.7–2.4)

## 2019-01-08 LAB — BASIC METABOLIC PANEL
Anion gap: 7 (ref 5–15)
BUN: 10 mg/dL (ref 6–20)
CO2: 27 mmol/L (ref 22–32)
Calcium: 8.3 mg/dL — ABNORMAL LOW (ref 8.9–10.3)
Chloride: 98 mmol/L (ref 98–111)
Creatinine, Ser: 0.72 mg/dL (ref 0.44–1.00)
GFR calc Af Amer: >60 mL/min (ref >60)
GFR calc non Af Amer: >60 mL/min (ref >60)
Glucose, Bld: 223 mg/dL — ABNORMAL HIGH (ref 70–99)
Potassium: 4.6 mmol/L (ref 3.5–5.1)
SODIUM: 132 mmol/L — AB (ref 135–145)

## 2019-01-08 LAB — CBC
HCT: 26.2 % — ABNORMAL LOW (ref 36.0–46.0)
Hemoglobin: 8 g/dL — ABNORMAL LOW (ref 12.0–15.0)
MCH: 26.3 pg (ref 26.0–34.0)
MCHC: 30.5 g/dL (ref 30.0–36.0)
MCV: 86.2 fL (ref 80.0–100.0)
PLATELETS: 313 10*3/uL (ref 150–400)
RBC: 3.04 MIL/uL — ABNORMAL LOW (ref 3.87–5.11)
RDW: 17.9 % — ABNORMAL HIGH (ref 11.5–15.5)
WBC: 17.5 10*3/uL — AB (ref 4.0–10.5)
nRBC: 0 % (ref 0.0–0.2)

## 2019-01-08 LAB — GLUCOSE, CAPILLARY
Glucose-Capillary: 211 mg/dL — ABNORMAL HIGH (ref 70–99)
Glucose-Capillary: 174 mg/dL — ABNORMAL HIGH (ref 70–99)
Glucose-Capillary: 105 mg/dL — ABNORMAL HIGH (ref 70–99)
Glucose-Capillary: 105 mg/dL — ABNORMAL HIGH (ref 70–99)

## 2019-01-08 LAB — HEPARIN LEVEL (UNFRACTIONATED)
HEPARIN UNFRACTIONATED: 0.26 [IU]/mL — AB (ref 0.30–0.70)
Heparin Unfractionated: 0.51 IU/mL (ref 0.30–0.70)

## 2019-01-08 MED ORDER — APIXABAN 5 MG PO TABS
5.0000 mg | ORAL_TABLET | Freq: Two times a day (BID) | ORAL | Status: DC
  Administered 2019-01-08 – 2019-01-18 (×21): 5 mg via ORAL
  Filled 2019-01-08 (×8): qty 1
  Filled 2019-01-08: qty 2
  Filled 2019-01-08 (×12): qty 1

## 2019-01-08 MED ORDER — GABAPENTIN 300 MG PO CAPS: 300.0000 mg | ORAL_CAPSULE | Freq: Three times a day (TID) | ORAL | Status: DC

## 2019-01-08 MED ORDER — ASPIRIN EC 81 MG PO TBEC
81.0000 mg | DELAYED_RELEASE_TABLET | Freq: Every day | ORAL | Status: DC
  Administered 2019-01-08 – 2019-01-18 (×11): 81 mg via ORAL
  Filled 2019-01-08 (×11): qty 1

## 2019-01-08 MED ORDER — KETOROLAC TROMETHAMINE 30 MG/ML IJ SOLN
30.0000 mg | Freq: Four times a day (QID) | INTRAMUSCULAR | Status: AC
  Administered 2019-01-08 – 2019-01-11 (×12): 30 mg via INTRAVENOUS
  Filled 2019-01-08 (×12): qty 1

## 2019-01-08 MED ORDER — PREGABALIN 75 MG PO CAPS
75.0000 mg | ORAL_CAPSULE | Freq: Two times a day (BID) | ORAL | Status: DC
  Administered 2019-01-08 – 2019-01-14 (×13): 75 mg via ORAL
  Filled 2019-01-08 (×13): qty 1

## 2019-01-08 MED ORDER — KETOROLAC TROMETHAMINE 15 MG/ML IJ SOLN: 15.0000 mg | Freq: Three times a day (TID) | INTRAMUSCULAR | Status: DC

## 2019-01-08 MED ORDER — HYDROMORPHONE HCL 1 MG/ML IJ SOLN
1.0000 mg | INTRAMUSCULAR | Status: AC | PRN
  Administered 2019-01-08 – 2019-01-10 (×9): 1 mg via INTRAVENOUS
  Filled 2019-01-08 (×9): qty 1

## 2019-01-08 NOTE — Consult Note (Signed)
ANTICOAGULATION CONSULT NOTE  Pharmacy Consult for Heparin Drip  Indication: Ischemic left leg   Patient Measurements: Height: 5\' 3"  (160 cm) Weight: 277 lb 9 oz (125.9 kg) IBW/kg (Calculated) : 52.4 Heparin Dosing Weight: 80 kg  Vital Signs: Temp: 98.4 F (36.9 C) (01/14 0029) Temp Source: Oral (01/14 0029) BP: 128/66 (01/14 0029) Pulse Rate: 101 (01/14 0029)  Labs: Recent Labs    01/05/19 0452 01/06/19 0433 01/07/19 0426 01/08/19 0017  HGB 8.5* 8.2* 8.2*  --   HCT 28.0* 26.3* 28.3*  --   PLT 379 355 344  --   LABPROT  --   --  13.2  --   INR  --   --  1.01  --   HEPARINUNFRC 0.48 0.37 0.35 0.26*  CREATININE 0.70 0.75 0.67  --     Estimated Creatinine Clearance: 101.4 mL/min (by C-G formula based on SCr of 0.67 mg/dL).   Assessment: Pharmacy consulted for heparin drip dosing and monitoring in 59 yo female with ischemic left leg. Patient S/P angioplasty,  mechanical thrombectomy and fasciotomy. Patient has clots in distal arteries of lower extremity.  Heparin was started on 1/7 at 600 units/hr with no level drawn and stopped soon afterward due to bleeding  Goal of Therapy:  Heparin level 0.3-0.7 units/ml Monitor platelets by anticoagulation protocol: Yes   Heparin course: (no bolus per Dr Wyn Quaker) 1/8 pm restarted at 800 units/hr 1/8 2203 HL<0.10 rate increased to 1100 units/hr 1/9 0731 HL<0.10 1/9 2100 HL 0.11. Increase to 1700 units/hr. No bolus per consult. Recheck heparin level and CBC with AM labs.  1/10 AM heparin level 0.28. Increase to 1850 units/hr and recheck in 6 hours.   Plan:  01/14 @ 0014 HL 0.26 subtherapeutic. Will increase rate to 1950 units/hr and will recheck HL @ 0800. Will f/u CBC w/ am labs.  Thomasene Ripple, PharmD Clinical Pharmacist 01/08/2019 1:59 AM

## 2019-01-08 NOTE — Progress Notes (Signed)
MD messaged: The patient had schedule gabapentin of 1200mg . Do you want to continue with 300mg  dose as well or discontinue the 300mg  dose. lastly the patient wanted to know of you will stop later today to see her.

## 2019-01-08 NOTE — Progress Notes (Signed)
Verbal order: Dr. Wyn Quaker, gabapentin 300mg  dose D/C. Lyrica 75mg  order BID.

## 2019-01-08 NOTE — Clinical Social Work Note (Signed)
CSW attempted to see patient now that she has had her surgery however, PT and OT are working with patient as I went to patient's room. CSW will return and complete assessment at a later time. York Spaniel MSW,LCSW [phone]

## 2019-01-08 NOTE — Progress Notes (Signed)
The patient is complaining of phantom pain. PRN IV morphine given, as well as, 10mg  of oxy given. The patient does not have any other PRN oral or IV medication for pain and is requesting additional IV pain medication if possible. pain is currently 9/10.

## 2019-01-08 NOTE — NC FL2 (Signed)
[place] MEDICAID FL2 LEVEL OF CARE SCREENING TOOL     IDENTIFICATION  Patient Name: Monet Condra Duerst Birthdate: Jul 13, 1961 Sex: female Admission Date (Current Location): 12/31/2018  [facility] [place] Number:  Chiropodist and Address:  [facility], [address], [place], [place] [address]      Provider Number: [id number]  Attending Physician Name and Address:  Annice Needy, MD  Relative Name and Phone Number:       Current Level of Care: Hospital Recommended Level of Care: Skilled Nursing Facility Prior Approval Number:    Date Approved/Denied:   PASRR Number:    Discharge Plan: Home    Current Diagnoses: Patient Active Problem List   Diagnosis Date Noted  . Pressure injury of skin 01/01/2019  . Ischemic leg 12/31/2018  . PAD (peripheral artery disease) (HCC) 12/04/2018  . Sepsis (HCC) 04/02/2018  . 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 CKD (chronic kidney disease) stage 3, GFR 30-59 ml/min (HCC) 07/04/2017  . Class 3 drug-induced obesity with serious comorbidity and body mass index (BMI) of 45.0 to 49.9 in adult (HCC) 07/04/2017  . Chronic feet pain (Location of Primary Source of Pain) (Bilateral) (R>L) 04/25/2017  . Diabetic peripheral neuropathy (HCC) (Location of Primary Source of Pain) (Bilateral) (R>L) 04/25/2017  . Chronic lower extremity pain (Location of Secondary source of pain) (Bilateral) (R>L) 04/25/2017  . Chronic pain syndrome 04/25/2017  . Long term (current) use of opiate analgesic 04/25/2017  . Long term prescription opiate use 04/25/2017  . Opiate use 04/25/2017  . Chronic hand pain (Location of Tertiary source of pain) (Bilateral) (L>R) 04/25/2017  . Carpal tunnel syndrome (Bilateral) (L>R) 04/25/2017  . History of stroke 04/25/2017  . Infected decubitus ulcer, unstageable (HCC) 10/26/2016  . Closed fracture of humerus, surgical neck 05/31/2016  . Closed 3-part fracture of surgical neck of right  humerus with delayed healing 05/31/2016  . Pain in shoulder 05/19/2016  . Edema leg 05/18/2016  . 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 Kidney lump 05/18/2016  . Bilateral edema of lower extremity 05/18/2016  . Bilateral leg weakness 05/18/2016  . Kidney mass 05/18/2016  . Pressure ulcer 04/24/2016  . History of DKA (diabetic ketoacidosis) (HCC) 04/23/2016    Class: History of  . Diabetes mellitus (HCC) 04/23/2016  . Other specified diabetes mellitus with ketoacidosis without coma (HCC) 04/23/2016    Orientation RESPIRATION BLADDER Height & Weight     Self, Time, Situation, Place  Normal Incontinent Weight: 266 lb 4.8 oz (120.8 kg) Height:  5\' 3"  (160 cm)  BEHAVIORAL SYMPTOMS/MOOD NEUROLOGICAL BOWEL NUTRITION STATUS  (none) (none) Continent Diet  AMBULATORY STATUS COMMUNICATION OF NEEDS Skin   Total Care Verbally Surgical wounds                       Personal Care Assistance Level of Assistance  Bathing, Feeding, Dressing Bathing Assistance: Maximum assistance Feeding assistance: Maximum assistance Dressing Assistance: Maximum assistance     Functional Limitations Info             SPECIAL CARE FACTORS FREQUENCY  PT (By licensed PT), OT (By licensed OT)                    Contractures Contractures Info: Not present    Additional Factors Info                  Current Medications (01/08/2019):  This is the current hospital active medication list  Current Facility-Administered Medications  Medication Dose Route Frequency Provider Last Rate Last Dose  . 0.9 %  sodium chloride infusion   Intravenous PRN Annice Needy, MD   Stopped at 01/06/19 1113  . acetaminophen (TYLENOL) tablet 650 mg  650 mg Oral Q6H PRN Annice Needy, MD   650 mg at 01/04/19 1540   Or  . acetaminophen (TYLENOL) suppository 650 mg  650 mg Rectal Q6H PRN Annice Needy, MD      . apixaban (ELIQUIS) tablet 5 mg  5 mg Oral BID Stegmayer, Ranae Plumber, PA-C      . aspirin EC tablet 81 mg  81 mg  Oral Daily Stegmayer, Kimberly A, PA-C      . atorvastatin (LIPITOR) tablet 20 mg  20 mg Oral QHS Annice Needy, MD   20 mg at 01/07/19 2106  . clonazepam (KLONOPIN) disintegrating tablet 0.25 mg  0.25 mg Oral BID PRN Annice Needy, MD   0.25 mg at 01/07/19 1135  . furosemide (LASIX) tablet 20 mg  20 mg Oral Daily Annice Needy, MD   20 mg at 01/08/19 0906  . gabapentin (NEURONTIN) tablet 1,200 mg  1,200 mg Oral TID Annice Needy, MD   1,200 mg at 01/08/19 0905  . HYDROmorphone (DILAUDID) injection 1 mg  1 mg Intravenous Q3H PRN Stegmayer, Kimberly A, PA-C      . insulin aspart (novoLOG) injection 0-20 Units  0-20 Units Subcutaneous TID WC Annice Needy, MD   4 Units at 01/08/19 1155  . insulin aspart (novoLOG) injection 0-5 Units  0-5 Units Subcutaneous QHS Annice Needy, MD   3 Units at 01/07/19 2255  . insulin aspart (novoLOG) injection 4 Units  4 Units Subcutaneous TID WC Annice Needy, MD   4 Units at 01/08/19 1156  . insulin glargine (LANTUS) injection 35 Units  35 Units Subcutaneous Daily Annice Needy, MD   35 Units at 01/08/19 0904  . ketorolac (TORADOL) 30 MG/ML injection 30 mg  30 mg Intravenous Q6H Stegmayer, Kimberly A, PA-C   30 mg at 01/08/19 1436  . latanoprost (XALATAN) 0.005 % ophthalmic solution 1 drop  1 drop Both Eyes QHS Annice Needy, MD   1 drop at 01/07/19 2123  . loperamide (IMODIUM) capsule 2 mg  2 mg Oral Q6H PRN Annice Needy, MD   2 mg at 01/05/19 0001  . metFORMIN (GLUCOPHAGE-XR) 24 hr tablet 500 mg  500 mg Oral BID Annice Needy, MD   500 mg at 01/08/19 0908  . metoprolol tartrate (LOPRESSOR) tablet 25 mg  25 mg Oral BID Annice Needy, MD   25 mg at 01/08/19 0906  . multivitamin-lutein (OCUVITE-LUTEIN) capsule 1 capsule  1 capsule Oral Daily Annice Needy, MD   1 capsule at 01/08/19 0908  . nicotine (NICODERM CQ - dosed in mg/24 hours) patch 14 mg  14 mg Transdermal Daily Annice Needy, MD   Stopped at 01/07/19 [phone]  . nicotine polacrilex (NICORETTE) gum 4 mg  4 mg Oral PRN Annice Needy, MD   4 mg at 01/06/19 2120  . nortriptyline (PAMELOR) capsule 50 mg  50 mg Oral QHS Annice Needy, MD   50 mg at 01/07/19 2117  . ondansetron (ZOFRAN) injection 4 mg  4 mg Intravenous Q6H PRN Annice Needy, MD      . oxybutynin (DITROPAN-XL) 24 hr tablet 10 mg  10 mg Oral QHS Annice Needy, MD  10 mg at 01/04/19 2036  . oxyCODONE (Oxy IR/ROXICODONE) immediate release tablet 5-10 mg  5-10 mg Oral Q4H PRN Annice Needyew, Jason S, MD   10 mg at 01/08/19 1435  . pantoprazole (PROTONIX) EC tablet 20 mg  20 mg Oral Daily Annice Needyew, Jason S, MD   20 mg at 01/08/19 0908  . pregabalin (LYRICA) capsule 75 mg  75 mg Oral BID Annice Needyew, Jason S, MD   75 mg at 01/08/19 1300  . protein supplement (ENSURE MAX) liquid  11 oz Oral BID Annice Needyew, Jason S, MD      . sodium chloride tablet 2 g  2 g Oral BID WC Annice Needyew, Jason S, MD   2 g at 01/08/19 0900  . traZODone (DESYREL) tablet 50 mg  50 mg Oral QHS Annice Needyew, Jason S, MD   50 mg at 01/04/19 2037     Discharge Medications: Please see discharge summary for a list of discharge medications.  Relevant Imaging Results:  Relevant Lab Results:   Additional Information ss: [id number]  York SpanielMonica Ramond Darnell, LCSW

## 2019-01-08 NOTE — Evaluation (Signed)
Occupational Therapy Evaluation Patient Details Name: [name] L Lazalde MRN: [id number] DOB: [birth year] Today's Date: 01/08/2019    History of Present Illness Pt is a 58 yo female with PMHx including PVD, anxiety, GERD, DM, HTN, CRF s/p extensive LLE revascularization during this hospitalization. Angiogram shows minimal flow through the calf even after angioplasty and thrombectomy. Concern for compartment syndrome and pt now s/p LLE fasciotomies (01/01/2019). 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   Clinical Impression   Pt seen for OT re-evaluation this date following LLE BKA on 01/07/2019. Prior to hospital admission, pt was modified independent with mobility from w/c level, performing transfers independently, and mod indep for ADL and most IADL. Pt does not drive and will often use [employer] service for transportation. Pt lives in a 1 story home with a ramped entrance. Currently pt demonstrates impairments in strength, pain (chronic neuropathy, phantom pain and surgical pain to residual LLE), and balance requiring min-mod assist for LB ADL from a seated level and increased assist for functional transfers. Pt able to perform bed mobility with supervision. Pt instructed in self mgt of pain including phantom pain, desensitization, and positioning of LLE to maximize recovery and in anticipation of future possibility of prosthetic training. Pt verbalized understanding. Pt would benefit from skilled OT to address noted impairments and functional limitations (see below for any additional details) in order to maximize safety and independence while minimizing falls risk and caregiver burden. Upon hospital discharge, recommend pt discharge to STR to facilitate safe eventual return home alone.    Follow Up Recommendations  SNF    Equipment Recommendations  Other (comment)(TBD)    Recommendations for Other Services       Precautions / Restrictions Precautions Precautions: Fall Precaution Comments: w/c level at  baseline Restrictions Weight Bearing Restrictions: Yes RLE Weight Bearing: Weight bearing as tolerated LLE Weight Bearing: Non weight bearing Other Position/Activity Restrictions: s/p LLE BKA      Mobility Bed Mobility Overal bed mobility: Needs Assistance Bed Mobility: Supine to Sit;Sit to Supine     Supine to sit: Supervision;HOB elevated Sit to supine: Supervision    Transfers    Balance Overall balance assessment: Needs assistance Sitting-balance support: Single extremity supported;Feet supported;Feet unsupported Sitting balance-Leahy Scale: Good                               ADL either performed or assessed with clinical judgement   ADL Overall ADL's : Needs assistance/impaired Eating/Feeding: Sitting;Independent   Grooming: Sitting;Independent   Upper Body Bathing: Sitting;Modified independent   Lower Body Bathing: Sitting/lateral leans;Minimal assistance;Moderate assistance   Upper Body Dressing : Sitting;Supervision/safety   Lower Body Dressing: Sitting/lateral leans;Minimal assistance;Moderate assistance                       Vision Baseline Vision/History: Wears glasses Wears Glasses: At all times Patient Visual Report: No change from baseline       Perception     Praxis      Pertinent Vitals/Pain Pain Assessment: 0-10 Pain Score: 7  Faces Pain Scale: Hurts even more Pain Location: surgical site/L stump Pain Descriptors / Indicators: Aching Pain Intervention(s): Limited activity within patient's tolerance;Monitored during session;Premedicated before session;Repositioned     Hand Dominance Right   Extremity/Trunk Assessment Upper Extremity Assessment Upper Extremity Assessment: Overall WFL for tasks assessed   Lower Extremity Assessment Lower Extremity Assessment: Defer to PT evaluation;Generalized weakness(RLE gen wk) LLE Deficits / Details:  s/p BKA LLE Sensation: (phantom pain)       Communication  Communication Communication: No difficulties   Cognition Arousal/Alertness: Awake/alert Behavior During Therapy: WFL for tasks assessed/performed Overall Cognitive Status: Within Functional Limits for tasks assessed                                     General Comments      Exercises Other Exercises Other Exercises: pt educated in self mgt of pain including phantom pain and desensitization  Other Exercises: pt educated in positioning of L stump Other Exercises: pt educated in recovery process after BKA   Shoulder Instructions      Home Living Family/patient expects to be discharged to:: Private residence Living Arrangements: Alone Available Help at Discharge: Family;Available PRN/intermittently Type of Home: House Home Access: Ramped entrance     Home Layout: One level     Bathroom Shower/Tub: Chief Strategy Officer: Standard     Home Equipment: Environmental consultant - 2 wheels;Cane - single point;Wheelchair - manual;Bedside commode          Prior Functioning/Environment Level of Independence: Independent with assistive device(s)        Comments: Pt mod indep at w/c level for mobility, transferring on her own; mod indep for ADL and 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); has RN 2x/wk for L nonhealing wound on foot; lady cleans the house 1x/mo; no falls in the last year        OT Problem List: Decreased strength;Decreased range of motion;Decreased knowledge of use of DME or AE;Pain;Impaired balance (sitting and/or standing)      OT Treatment/Interventions: Self-care/ADL training;Balance training;Therapeutic exercise;Therapeutic activities;DME and/or AE instruction;Patient/family education    OT Goals(Current goals can be found in the care plan section) Acute Rehab OT Goals Patient Stated Goal: to get my leg healed and go back home OT Goal Formulation: With patient Time For Goal Achievement: 01/22/19 Potential to Achieve Goals: Good  OT  Frequency: Min 2X/week   Barriers to D/C:            Co-evaluation              AM-PAC OT "6 Clicks" Daily Activity     Outcome Measure Help from another person eating meals?: None Help from another person taking care of personal grooming?: None Help from another person toileting, which includes using toliet, bedpan, or urinal?: A Lot Help from another person bathing (including washing, rinsing, drying)?: A Lot Help from another person to put on and taking off regular upper body clothing?: None Help from another person to put on and taking off regular lower body clothing?: A Lot 6 Click Score: 18   End of Session    Activity Tolerance: Patient tolerated treatment well Patient left: in bed;with call bell/phone within reach;with bed alarm set  OT Visit Diagnosis: Other abnormalities of gait and mobility (R26.89);Pain;Muscle weakness (generalized) (M62.81) Pain - Right/Left: Left Pain - part of body: Leg                Time: 2297-9892 OT Time Calculation (min): 35 min Charges:  OT General Charges $OT Visit: 1 Visit OT Evaluation $OT Re-eval: 1 Re-eval OT Treatments $Self Care/Home Management : 8-22 mins  Richrd Prime, MPH, MS, OTR/L ascom [phone] 01/08/19, 5:06 PM

## 2019-01-08 NOTE — Progress Notes (Signed)
[facility] Daily Progress Note   Subjective: 8 Days Post-Op: Ultrasound guidance for vascular accessrightfemoral artery, Catheter placement into left common femoral artery from right femoral approach, Selectiveleftlower extremity angiogram, Placement of 8 mg of TPA with an infusion catheter to the left SFA and popliteal arteries, Placement of infusion catheter in left SFA, popliteal artery, and proximal posterior tibial artery for continuous infusion of TPA overnight, Placement of a right femoral venous triple-lumen catheter with ultrasound and fluoroscopy guidance  7 Days Post-Op: Angiogram through existing catheter left lower extremity, Catheter placement into left posterior tibial artery from right femoral approach, Mechanical thrombectomy to the left SFA, popliteal artery, tibioperoneal trunk, and posterior tibial arteries with the penumbra cat 6 device, Percutaneous transluminal angioplasty ofleft posterior tibial artery and tibioperoneal trunk with 3 mm diameter by 30 cm length angioplasty balloon with StarClose closure device rightfemoral artery  7 Days Post-Op: Left leg / calf fasciotomies  1 Days Post-Op: Left below-the-knee amputation  Patient complaining of left stump pain. States last night pain was "really bad".   Objective: Vitals:   01/08/19 0029 01/08/19 0442 01/08/19 0855 01/08/19 1140  BP: 128/66 (!) 128/57 (!) 123/96 122/67  Pulse: (!) 101 100 (!) 104 (!) 101  Resp: 16 16 18    Temp: 98.4 F (36.9 C) (!) 97.5 F (36.4 C) 98.6 F (37 C) 98.7 F (37.1 C)  TempSrc: Oral Oral Oral Oral  SpO2: 99% 97% 99% 97%  Weight:  120.8 kg    Height:        Intake/Output Summary (Last 24 hours) at 01/08/2019 1325 Last data filed at 01/08/2019 0523 Gross per 24 hour  Intake 2469.2 ml  Output 1050 ml  Net 1419.2 ml   Physical Exam: A&Ox3, NAD CV: RRR Pulmonary: CTA Bilaterally Abdomen: Soft, Nontender, Nondistended Vascular:  Left Lower  Extremity: Thigh soft, OR dressing clean, dry and intact   Laboratory: CBC    Component Value Date/Time   WBC 17.5 (H) 01/08/2019 0814   HGB 8.0 (L) 01/08/2019 0814   HCT 26.2 (L) 01/08/2019 0814   PLT 313 01/08/2019 0814   BMET    Component Value Date/Time   NA 132 (L) 01/08/2019 0814   K 4.6 01/08/2019 0814   CL 98 01/08/2019 0814   CO2 27 01/08/2019 0814   GLUCOSE 223 (H) 01/08/2019 0814   BUN 10 01/08/2019 0814   CREATININE 0.72 01/08/2019 0814   CALCIUM 8.3 (L) 01/08/2019 0814   GFRNONAA >60 01/08/2019 0814   GFRAA >60 01/08/2019 0814   Assessment/Planning: The patient is a 58 year old female s/p a Left BKA - POD #1 1) Hbg stable 2) WBC increased - will monitor 3) AM labs 4) Will stop heparin and transition to ASA and Eliquis 5) Toradol and Dilaudid added for better pain control 6) PT / OT 7) Will remove dressing before discharge  8) Awaiting PT recommendations for dispo. We highly recommend rehab.   Discussed with Dr. Wallis Mart Stegmayer PA-C 01/08/2019 1:25 PM

## 2019-01-08 NOTE — Progress Notes (Signed)
Inpatient Diabetes Program Recommendations  AACE/ADA: New Consensus Statement on Inpatient Glycemic Control   Target Ranges:  Prepandial:   less than 140 mg/dL      [facility] postprandial:   less than 180 mg/dL (1-2 hours)      Critically ill patients:  140 - 180 mg/dL   Results for CASSARA, NARDONE (MRN [id number]) as of 01/08/2019 08:51  Ref. Range 01/07/2019 07:47 01/07/2019 11:34 01/07/2019 14:05 01/07/2019 16:07 01/07/2019 18:35 01/07/2019 22:11 01/08/2019 07:26  Glucose-Capillary Latest Ref Range: 70 - 99 mg/dL 678 (H) 99 79 938 (H) 101 (H) 272 (H) 211 (H)   Review of Glycemic Control  Diabetes history:DM2 Outpatient Diabetes medications:Lantus 70 units daily, Humalog 15 units TID with meals, Metformin 500 mg BID Current orders for Inpatient glycemic control:Lantus 35 units QHS, Novolog 0-20 units TID with meals, Novolog 0-5 units QHS, Novolog 4 units TID with meals, Metformin 500 mg BID  NOTE: Noted patient received Decadron 5mg  at 14:30 on 01/07/19 and patient was NPO yesterday morning and afternoon for surgery yesterday. Noted glucose 211 mg/dl this morning. Anticipate Decadron contributing to hyperglycemia. No other steroids ordered so expect glucose to improve. Agree with current orders for glycemic control. Will follow along while inpatient.  Thanks, Orlando Penner, RN, MSN, CDE Diabetes Coordinator Inpatient Diabetes Program [phone] (Team Pager from 8am to 5pm)

## 2019-01-08 NOTE — Progress Notes (Signed)
PT Re-Evaluation Note  Assessment: Pt now s/p L BKA on 1/13. Strength at L hip grossly at least 3/5.  Pt with difficulty flexing L knee due to swelling and pain.  Education provided for proper positioning of LLE and desensitization techniques given phantom pain.  Pt currently requires min assist for supine>sit.  She requires min +2 assist for sit>stand with bed elevated and fatigues quickly and was unable to attempt stand pivot transfer or ambulation this date.  She put forth good effort.  Given pt's current mobility status, recommending SNF at d/c.      01/08/19 1653  PT Visit Information  Last PT Received On 01/08/19  Assistance Needed +2  History of Present Illness Pt is a 58 yo female with PMHx including PVD, anxiety, GERD, DM, HTN, CRF s/p extensive LLE revascularization during this hospitalization. Angiogram shows minimal flow through the calf even after angioplasty and thrombectomy. Concern for compartment syndrome and pt now s/p LLE fasciotomies (01/01/2019).  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  Precautions  Precautions Fall;Other (comment)  Precaution Comments R femoral central line  Restrictions  Weight Bearing Restrictions Yes  LLE Weight Bearing NWB  Home Living  Family/patient expects to be discharged to: Private residence  Living Arrangements Alone  Available Help at Discharge Family;Available PRN/intermittently  Type of Home House  Home Access Ramped entrance  Home Layout One level  Bathroom Shower/Tub Tub/shower unit  Engineer, waterBathroom Toilet Standard  Home Equipment Walker - 2 wheels;Cane - single point;Wheelchair - manual;BSC  Prior Function  Level of Independence Independent with assistive device(s)  Comments Pt mod indep at w/c level for mobility, transferring on her own; mod indep for ADL and 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); has RN 2x/wk for L nonhealing wound on foot; lady cleans the house 1x/mo; no falls in the last year  Communication  Communication No difficulties  Pain  Assessment  Pain Assessment Faces  Faces Pain Scale 6  Pain Location distal LLE  Pain Descriptors / Indicators Grimacing;Guarding;Moaning  Pain Intervention(s) Limited activity within patient's tolerance;Monitored during session;RN gave pain meds during session;Utilized relaxation techniques  Cognition  Arousal/Alertness Awake/alert  Behavior During Therapy [facility]FL for tasks assessed/performed  Overall Cognitive Status Within Functional Limits for tasks assessed  Upper Extremity Assessment  Upper Extremity Assessment Defer to OT evaluation  Lower Extremity Assessment  Lower Extremity Assessment LLE deficits/detail  LLE Deficits / Details s/p L BKA on 1/13.  Strength at hip grossly at least 3/5.  Pt with difficulty flexing L knee due to swelling and pain.   LLE Sensation  (phantom pain)  Bed Mobility  Overal bed mobility Needs Assistance  Bed Mobility Supine to Sit;Sit to Supine  Supine to sit Min assist;HOB elevated  Sit to supine Min guard  General bed mobility comments Assist to advance LLE to EOB.  HOB elevated and pt uses bed rail.   Transfers  Overall transfer level Needs assistance  Equipment used Rolling walker (2 wheeled)  Transfers Sit to/from Stand  Sit to Stand Min assist;+2 physical assistance;From elevated surface  General transfer comment Cues for proper hand placement and R foot placement.  Bed elevated.  Assist to boost to standing and to remain steady.   Ambulation/Gait  General Gait Details Not safe to attempt at this time.  Balance  Overall balance assessment Needs assistance  Sitting-balance support No upper extremity supported;Feet supported  Sitting balance-Leahy Scale Good  Standing balance support Bilateral upper extremity supported;During functional activity  Standing balance-Leahy Scale Poor  Standing balance comment Pt relies on BUE support for static and dynamic activity  General Comments  General comments (skin integrity, edema, etc.) HR in low 100s at  rest, up to 114 with activity.    Exercises  Exercises Other exercises;General Lower Extremity  General Exercises - Lower Extremity  Quad Sets Strengthening;Left;10 reps;Supine  Other Exercises  Other Exercises Instructed pt in desensitization technique to distal LLE which pt demonstrated understanding back to this therapist.  Encouraged pt to use this technique 1x/hr.    Other Exercises Educated pt in proper positioning of LLE as pt initially had pillow under upper LE and buttocks upon PT arrival.   Other Exercises Standing at bedside for ~45 seconds.  Pt fatigues quickly and reports mild dizziness which resolves once sitting.   PT - End of Session  Equipment Utilized During Treatment Gait belt  Activity Tolerance Patient limited by fatigue  Patient left in bed;with call bell/phone within reach;with bed alarm set;with SCD's reapplied  Nurse Communication Mobility status  PT Assessment  PT Recommendation/Assessment Patient needs continued PT services  PT Visit Diagnosis Pain;Unsteadiness on feet (R26.81);Other abnormalities of gait and mobility (R26.89);Difficulty in walking, not elsewhere classified (R26.2);Muscle weakness (generalized) (M62.81)  Pain - Right/Left Left  Pain - part of body Leg  PT Problem List Decreased strength;Decreased range of motion;Decreased activity tolerance;Decreased balance;Decreased mobility;Decreased knowledge of use of DME;Decreased safety awareness;Decreased knowledge of precautions;Pain;Obesity;Impaired sensation  Barriers to Discharge Decreased caregiver support  PT Plan  PT Frequency (ACUTE ONLY) 7X/week  PT Treatment/Interventions (ACUTE ONLY) DME instruction;Gait training;Stair training;Functional mobility training;Therapeutic activities;Therapeutic exercise;Balance training;Neuromuscular re-education;Patient/family education;Wheelchair mobility training;Modalities;Manual techniques  AM-PAC PT "6 Clicks" Mobility Outcome Measure (Version 2)  Help needed  turning from your back to your side while in a flat bed without using bedrails? 3  Help needed moving from lying on your back to sitting on the side of a flat bed without using bedrails? 3  Help needed moving to and from a bed to a chair (including a wheelchair)? 1  Help needed standing up from a chair using your arms (e.g., wheelchair or bedside chair)? 2  Help needed to walk in hospital room? 1  Help needed climbing 3-5 steps with a railing?  1  6 Click Score 11  Consider Recommendation of Discharge To: CIR/SNF/LTACH  PT Recommendation  Follow Up Recommendations SNF  PT equipment Other (comment) (TBD at next venue of care, will likely need WC)  Individuals Consulted  Consulted and Agree with Results and Recommendations Patient  Acute Rehab PT Goals  Patient Stated Goal to improve independence  PT Goal Formulation With patient  Time For Goal Achievement 01/22/19  Potential to Achieve Goals Good  PT Time Calculation  PT Start Time (ACUTE ONLY) 1418  PT Stop Time (ACUTE ONLY) 1513  PT Time Calculation (min) (ACUTE ONLY) 55 min  PT General Charges  $$ ACUTE PT VISIT 1 Visit  PT Evaluation  $PT Re-evaluation 1 Re-eval  PT Treatments  $Therapeutic Exercise 8-22 mins  $Therapeutic Activity 23-37 mins   Encarnacion Chu PT, DPT

## 2019-01-08 NOTE — Progress Notes (Signed)
Dr. Wyn Quaker provided a verbal order for neurontin 300mg  TID for phantom pain and toradol 15mg  IV Q8 ordered.

## 2019-01-08 NOTE — Clinical Social Work Note (Signed)
Clinical Social Work Assessment  Patient Details  Name: [name] MRN: [id number] Date of Birth: 01-13-1961  Date of referral:  01/08/19               Reason for consult:  Discharge Planning                Permission sought to share information with:  Facility Industrial/product designer granted to share information::  Yes, Verbal Permission Granted  Name::        Agency::     Relationship::     Contact Information:     Housing/Transportation Living arrangements for the past 2 months:  Single Family Home Source of Information:  Patient Patient Interpreter Needed:  None Criminal Activity/Legal Involvement Pertinent to Current Situation/Hospitalization:  No - Comment as needed Significant Relationships:    Lives with:  Self Do you feel safe going back to the place where you live?  Yes Need for family participation in patient care:  Yes (Comment)  Care giving concerns:  Patient resides at home.    Social Worker assessment / plan:  CSW spoke with patient regarding discharge planning. Patient informed CSW that she has been at [facility] and does not want to return there. She states that she is willing to go to rehab. Patient is familiar with the placement process but CSW explained again. Patient's insurance will have to provide prior auth.   Employment status:    Insurance information:    PT Recommendations:    Information / Referral to community resources:     Patient/Family's Response to care:  Patient expressed appreciation for CSW assistance.  Patient/Family's Understanding of and Emotional Response to Diagnosis, Current Treatment, and Prognosis:  Patient is wanting to improve and is willing to do what is necessary to improve.  Emotional Assessment Appearance:  Appears older than stated age Attitude/Demeanor/Rapport:  (pleasant and cooperative) Affect (typically observed):  Accepting, Calm, Appropriate Orientation:  Oriented to Self, Oriented to Place, Oriented to  Time,  Oriented to Situation Alcohol / Substance use:  Not Applicable Psych involvement (Current and /or in the community):  No (Comment)  Discharge Needs  Concerns to be addressed:  Care Coordination Readmission within the last 30 days:  No Current discharge risk:  None Barriers to Discharge:  No Barriers Identified   York Spaniel, LCSW 01/08/2019, 6:51 PM

## 2019-01-08 NOTE — Consult Note (Signed)
PHARMACY CONSULT NOTE - FOLLOW UP  Pharmacy Consult for Electrolyte Monitoring and Replacement   Recent Labs: Potassium (mmol/L)  Date Value  01/08/2019 4.6   Magnesium (mg/dL)  Date Value  59/74/1638 2.0   Calcium (mg/dL)  Date Value  45/36/4680 8.3 (L)   Albumin (g/dL)  Date Value  32/11/2481 1.2 (L)   Phosphorus (mg/dL)  Date Value  50/02/7047 3.4   Sodium (mmol/L)  Date Value  01/08/2019 132 (L)   Correct Calcium: 10.5  Assessment: Pharmacy consulted for electrolyte monitoring and replacement in 58 yo female admitted with LLE pain and ulceration. Patient had hypomagnesemia and hypokalemia on admission.   Goal of Therapy:  Electrolytes WNL   Plan:  No electrolyte replacement required. All electrolytes wnl.  Will F/U up with AM labs on 1/15 and continue to replace as needed.   Lowella Bandy, PharmD Clinical Pharmacist 01/08/2019 2:16 PM

## 2019-01-08 NOTE — Consult Note (Signed)
ANTICOAGULATION CONSULT NOTE  Pharmacy Consult for Heparin Drip  Indication: Ischemic left leg   Patient Measurements: Height: 5\' 3"  (160 cm) Weight: 266 lb 4.8 oz (120.8 kg) IBW/kg (Calculated) : 52.4 Heparin Dosing Weight: 80 kg  Vital Signs: Temp: 98.7 F (37.1 C) (01/14 1140) Temp Source: Oral (01/14 1140) BP: 122/67 (01/14 1140) Pulse Rate: 101 (01/14 1140)  Labs: Recent Labs    01/06/19 0433 01/07/19 0426 01/08/19 0017 01/08/19 0814  HGB 8.2* 8.2*  --  8.0*  HCT 26.3* 28.3*  --  26.2*  PLT 355 344  --  313  LABPROT  --  13.2  --   --   INR  --  1.01  --   --   HEPARINUNFRC 0.37 0.35 0.26* 0.51  CREATININE 0.75 0.67  --  0.72    Estimated Creatinine Clearance: 98.9 mL/min (by C-G formula based on SCr of 0.72 mg/dL).   Assessment: Pharmacy consulted for heparin drip dosing and monitoring in 58 yo female with ischemic left leg. Patient S/P angioplasty,  mechanical thrombectomy and fasciotomy. Patient has clots in distal arteries of lower extremity.  Heparin was started on 1/7 at 600 units/hr with no level drawn and stopped soon afterward due to bleeding. Hgb is stable at approximately 8 g/dL.  Goal of Therapy:  Heparin level 0.3-0.7 units/ml Monitor platelets by anticoagulation protocol: Yes   Heparin course: (no bolus per Dr Wyn Quaker) 1/8 pm restarted at 800 units/hr 1/8 2203 HL<0.10 rate increased to 1100 units/hr 1/9 0731 HL<0.10 1/9 2100 HL 0.11. Increased to 1700 units/hr.  1/10 AM HL 0.28. Increased to 1850 units/hr  1/10 1405 HL 0.36  1/10 2148 HL 0.46 1/12 0433 HL 0.37 1/13 0426 HL 0.35 1/14 0017 HL 0.26: rate increased to 1950 units/hr 1/14 0814 HL 0.51   Plan:  Will maintain rate at 1950 units/hr and will recheck HL at 1600. Will f/u CBC w/ am labs.  Lowella Bandy, PharmD Clinical Pharmacist 01/08/2019 12:05 PM

## 2019-01-09 ENCOUNTER — Ambulatory Visit (INDEPENDENT_AMBULATORY_CARE_PROVIDER_SITE_OTHER): Payer: Medicare HMO | Admitting: Nurse Practitioner

## 2019-01-09 ENCOUNTER — Ambulatory Visit: Payer: Medicare HMO | Admitting: Internal Medicine

## 2019-01-09 ENCOUNTER — Encounter (INDEPENDENT_AMBULATORY_CARE_PROVIDER_SITE_OTHER): Payer: Medicare HMO

## 2019-01-09 LAB — BASIC METABOLIC PANEL
Anion gap: 6 (ref 5–15)
BUN: 15 mg/dL (ref 6–20)
CO2: 29 mmol/L (ref 22–32)
Calcium: 8.6 mg/dL — ABNORMAL LOW (ref 8.9–10.3)
Chloride: 99 mmol/L (ref 98–111)
Creatinine, Ser: 0.87 mg/dL (ref 0.44–1.00)
GFR calc Af Amer: >60 mL/min (ref >60)
GFR calc non Af Amer: >60 mL/min (ref >60)
Glucose, Bld: 111 mg/dL — ABNORMAL HIGH (ref 70–99)
Potassium: 4.1 mmol/L (ref 3.5–5.1)
Sodium: 134 mmol/L — ABNORMAL LOW (ref 135–145)

## 2019-01-09 LAB — CBC
HCT: 25.4 % — ABNORMAL LOW (ref 36.0–46.0)
Hemoglobin: 7.5 g/dL — ABNORMAL LOW (ref 12.0–15.0)
MCH: 26.2 pg (ref 26.0–34.0)
MCHC: 29.5 g/dL — ABNORMAL LOW (ref 30.0–36.0)
MCV: 88.8 fL (ref 80.0–100.0)
PLATELETS: 311 10*3/uL (ref 150–400)
RBC: 2.86 MIL/uL — ABNORMAL LOW (ref 3.87–5.11)
RDW: 18.6 % — ABNORMAL HIGH (ref 11.5–15.5)
WBC: 11.3 10*3/uL — ABNORMAL HIGH (ref 4.0–10.5)
nRBC: 0 % (ref 0.0–0.2)

## 2019-01-09 LAB — GLUCOSE, CAPILLARY
Glucose-Capillary: 126 mg/dL — ABNORMAL HIGH (ref 70–99)
Glucose-Capillary: 127 mg/dL — ABNORMAL HIGH (ref 70–99)
Glucose-Capillary: 93 mg/dL (ref 70–99)
Glucose-Capillary: 167 mg/dL — ABNORMAL HIGH (ref 70–99)

## 2019-01-09 LAB — SURGICAL PATHOLOGY

## 2019-01-09 LAB — MAGNESIUM: Magnesium: 1.7 mg/dL (ref 1.7–2.4)

## 2019-01-09 NOTE — Progress Notes (Signed)
Occupational Therapy Treatment Patient Details Name: [name] MRN: [id number] DOB: 26-Aug-1961 Today's Date: 01/09/2019    History of present illness Pt is a 58 yo female with PMHx including PVD, anxiety, GERD, DM, HTN, CRF s/p extensive LLE revascularization during this hospitalization. Angiogram shows minimal flow through the calf even after angioplasty and thrombectomy. Concern for compartment syndrome and pt now s/p LLE fasciotomies (01/01/2019). 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   OT comments  Pt seen for OT tx this date. Pt eager to work with therapy, reporting 5/10 L stump pain. Pt able to recall >50% of instruction from previous date on desensitization. Additional instruction provided to support comprehensive understanding and carryover of learned techniques. Pt performed bed mobility with supervision and cues to use pursed lip breathing to minimize SOB. HR <105 at rest, increasing up to 123 with exertion during mobility. Pt performed STS t/f with Min A x2 and cues for hands/foot placement to optimize positioning. On 1st attempt, pt unable to come fully to standing. On 2nd attempt pt able to perform and stand for approx. 1 min before requesting to sit back down. >3 min for HR to recover back down to low 100's. Pt performed lateral scoots and sit>sup with supervision and brief rest breaks during tasks. PT in room at end of session. Pt progressing towards goals; will continue to work towards OT POC.   Follow Up Recommendations  SNF    Equipment Recommendations  Other (comment)(TBD)    Recommendations for Other Services      Precautions / Restrictions Precautions Precautions: Fall Precaution Comments: w/c level at baseline Restrictions Weight Bearing Restrictions: Yes LLE Weight Bearing: Non weight bearing(BKA) Other Position/Activity Restrictions: s/p LLE BKA       Mobility Bed Mobility Overal bed mobility: Needs Assistance Bed Mobility: Supine to Sit;Sit to Supine     Supine  to sit: Supervision;HOB elevated Sit to supine: Supervision   General bed mobility comments: + time/effort to perform, rest breaks with cues for PLB to minimize SOB; HR up to 123 with effort  Transfers Overall transfer level: Needs assistance Equipment used: Rolling walker (2 wheeled) Transfers: Sit to/from Stand Sit to Stand: Min assist;+2 physical assistance;From elevated surface        Lateral/Scoot Transfers: Supervision General transfer comment: initial attempt unable to come to standing, with additional cues for hand/foot placement and bringing COG over BOS, pt able to perform STS t/f x2 with Min A +2 with rest break in between. Able to tolerate standing approx 1 min    Balance Overall balance assessment: Needs assistance Sitting-balance support: Single extremity supported;Feet supported;Feet unsupported Sitting balance-Leahy Scale: Good     Standing balance support: Bilateral upper extremity supported;During functional activity Standing balance-Leahy Scale: Poor Standing balance comment: Pt relies on BUE support for static and dynamic activity                           ADL either performed or assessed with clinical judgement   ADL                                               Vision Patient Visual Report: No change from baseline     Perception     Praxis      Cognition Arousal/Alertness: Awake/alert Behavior During Therapy: WFL for tasks assessed/performed Overall  Cognitive Status: Within Functional Limits for tasks assessed                                          Exercises Other Exercises Other Exercises: pt able to recall general desensitization education, required additional instruction to support comprehensive strategies/techniques   Shoulder Instructions       General Comments HR <105 at rest, increasing to 123 with exertion during bed mobility and transfers    Pertinent Vitals/ Pain       Pain  Assessment: 0-10 Pain Score: 5  Pain Location: surgical site/L stump Pain Descriptors / Indicators: Aching Pain Intervention(s): Limited activity within patient's tolerance;Monitored during session;Repositioned  Home Living                                          Prior Functioning/Environment              Frequency  Min 2X/week        Progress Toward Goals  OT Goals(current goals can now be found in the care plan section)  Progress towards OT goals: Progressing toward goals  Acute Rehab OT Goals Patient Stated Goal: to get my leg healed and go back home OT Goal Formulation: With patient Time For Goal Achievement: 01/22/19 Potential to Achieve Goals: Good  Plan Discharge plan remains appropriate;Frequency remains appropriate    Co-evaluation                 AM-PAC OT "6 Clicks" Daily Activity     Outcome Measure   Help from another person eating meals?: None Help from another person taking care of personal grooming?: None Help from another person toileting, which includes using toliet, bedpan, or urinal?: A Lot Help from another person bathing (including washing, rinsing, drying)?: A Lot Help from another person to put on and taking off regular upper body clothing?: None Help from another person to put on and taking off regular lower body clothing?: A Lot 6 Click Score: 18    End of Session Equipment Utilized During Treatment: Gait belt;Rolling walker  OT Visit Diagnosis: Other abnormalities of gait and mobility (R26.89);Pain;Muscle weakness (generalized) (M62.81) Pain - Right/Left: Left Pain - part of body: Leg   Activity Tolerance Patient tolerated treatment well   Patient Left in bed;with call bell/phone within reach;Other (comment);with family/visitor present(PT in room )   Nurse Communication          Time: [id number] OT Time Calculation (min): 41 min  Charges: OT General Charges $OT Visit: 1 Visit OT  Treatments $Therapeutic Activity: 38-52 mins  Richrd Prime, MPH, MS, OTR/L ascom [phone] 01/09/19, 3:39 PM

## 2019-01-09 NOTE — Progress Notes (Signed)
[facility]  Daily Progress Note   Subjective  - 2 Days Post-Op  Doing better today. Pain is better but not gone Still not very mobile so Foley still in place  Objective Vitals:   01/09/19 0622 01/09/19 0937 01/09/19 1221 01/09/19 1224  BP:  120/65 133/90 122/65  Pulse:  99 (!) 105 (!) 105  Resp:   16   Temp:   (!) 97.1 F (36.2 C)   TempSrc:   Axillary   SpO2:   100% 100%  Weight: 121.5 kg     Height:        Intake/Output Summary (Last 24 hours) at 01/09/2019 1710 Last data filed at 01/09/2019 1404 Gross per 24 hour  Intake 240 ml  Output 925 ml  Net -685 ml    PULM  CTAB CV  RRR VASC  Dressing C/D/I  Laboratory CBC    Component Value Date/Time   WBC 11.3 (H) 01/09/2019 0354   HGB 7.5 (L) 01/09/2019 0354   HCT 25.4 (L) 01/09/2019 0354   PLT 311 01/09/2019 0354    BMET    Component Value Date/Time   NA 134 (L) 01/09/2019 0354   K 4.1 01/09/2019 0354   CL 99 01/09/2019 0354   CO2 29 01/09/2019 0354   GLUCOSE 111 (H) 01/09/2019 0354   BUN 15 01/09/2019 0354   CREATININE 0.87 01/09/2019 0354   CALCIUM 8.6 (L) 01/09/2019 0354   GFRNONAA >60 01/09/2019 0354   GFRAA >60 01/09/2019 0354    Assessment/Planning: POD #2 s/p left BKA   Leave Foley in one more day due to immobility.  Plan to remove tomorrow  Plan to take dressing off Friday and go to rehab Friday or Saturday      Festus Barren  01/09/2019, 5:10 PM

## 2019-01-09 NOTE — Progress Notes (Signed)
Physical Therapy Treatment Patient Details Name: [name] MRN: [id number] DOB: 07/06/1961 Today's Date: 01/09/2019    History of Present Illness Pt is a 58 yo female with PMHx including PVD, anxiety, GERD, DM, HTN, CRF s/p extensive LLE revascularization during this hospitalization. Angiogram shows minimal flow through the calf even after angioplasty and thrombectomy. Concern for compartment syndrome and pt now s/p LLE fasciotomies (01/01/2019).  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    PT Comments    [name] had just finished working with OT who had practiced sit<>stand so pt declined attempting again but was agreeable to perform lateral scoot transfer with PT.  She requires mod>max cues throughout lateral scoot transfer bed>chair and demonstrates poor safety awareness, placing her at an increased fall risk.  Pt requires several rest breaks with transfer due to fatigue.  Pt reports she believes the desensitization techniques are helping to decrease her pain.  Follow up recommendations remain appropriate.    Follow Up Recommendations  SNF     Equipment Recommendations  Other (comment)(TBD at next venue of care, will likely need WC)    Recommendations for Other Services       Precautions / Restrictions Precautions Precautions: Fall;Other (comment) Precaution Comments: R femoral central line Restrictions Weight Bearing Restrictions: Yes LLE Weight Bearing: Non weight bearing Other Position/Activity Restrictions: s/p LLE BKA    Mobility  Bed Mobility Overal bed mobility: Needs Assistance Bed Mobility: Supine to Sit     Supine to sit: HOB elevated;Min guard Sit to supine: Supervision   General bed mobility comments: Increased time and effort with HOB elevated.  1 rest break due to fatigue.   Transfers Overall transfer level: Needs assistance Equipment used: None Transfers: Lateral/Scoot Transfers Sit to Stand: Min assist;+2 physical assistance;From elevated surface         Lateral/Scoot Transfers: Min guard;From elevated surface General transfer comment: Cues for proper hand placement as pt attempting to reach too far away to armrest of chair and also to reach across body at times.  Cues to avoid scooting too far anteriorly.  Several rest breaks due to fatigue.    Ambulation/Gait             General Gait Details: Not safe to attempt at this time.   Stairs             Wheelchair Mobility    Modified Rankin (Stroke Patients Only)       Balance Overall balance assessment: Needs assistance Sitting-balance support: No upper extremity supported;Feet supported Sitting balance-Leahy Scale: Good     Standing balance support: Bilateral upper extremity supported;During functional activity Standing balance-Leahy Scale: Poor Standing balance comment: Pt relies on BUE support for static and dynamic activity                            Cognition Arousal/Alertness: Awake/alert Behavior During Therapy: WFL for tasks assessed/performed Overall Cognitive Status: Within Functional Limits for tasks assessed                                        Exercises General Exercises - Lower Extremity Quad Sets: Strengthening;Left;10 reps;Supine Other Exercises Other Exercises: pt able to recall general desensitization education, required additional instruction to support comprehensive strategies/techniques    General Comments General comments (skin integrity, edema, etc.): HR in low 100s at rest.  HR monitor intermittently  displaying HR but intermittently reading "unable to read ECG".  RN notified.        Pertinent Vitals/Pain Pain Assessment: 0-10 Pain Score: 6  Pain Location: distal LLE Pain Descriptors / Indicators: Grimacing;Guarding;Moaning Pain Intervention(s): Limited activity within patient's tolerance;Monitored during session;Repositioned;Utilized relaxation techniques    Home Living                       Prior Function            PT Goals (current goals can now be found in the care plan section) Acute Rehab PT Goals Patient Stated Goal: to improve independence PT Goal Formulation: With patient Time For Goal Achievement: 01/22/19 Potential to Achieve Goals: Good Progress towards PT goals: Progressing toward goals    Frequency    7X/week      PT Plan Current plan remains appropriate    Co-evaluation              AM-PAC PT "6 Clicks" Mobility   Outcome Measure  Help needed turning from your back to your side while in a flat bed without using bedrails?: A Little Help needed moving from lying on your back to sitting on the side of a flat bed without using bedrails?: A Little Help needed moving to and from a bed to a chair (including a wheelchair)?: A Little Help needed standing up from a chair using your arms (e.g., wheelchair or bedside chair)?: A Lot Help needed to walk in hospital room?: Total Help needed climbing 3-5 steps with a railing? : Total 6 Click Score: 13    End of Session Equipment Utilized During Treatment: Gait belt Activity Tolerance: Patient limited by fatigue Patient left: in chair;with call bell/phone within reach;with chair alarm set Nurse Communication: Mobility status;Other (comment)(HR monitor not reading consistently) PT Visit Diagnosis: Pain;Unsteadiness on feet (R26.81);Other abnormalities of gait and mobility (R26.89);Difficulty in walking, not elsewhere classified (R26.2);Muscle weakness (generalized) (M62.81) Pain - Right/Left: Left Pain - part of body: Leg     Time: 7493-5521 PT Time Calculation (min) (ACUTE ONLY): 22 min  Charges:  $Therapeutic Activity: 8-22 mins                     Encarnacion Chu PT, DPT 01/09/2019, 4:26 PM

## 2019-01-09 NOTE — Plan of Care (Signed)
Pain being managed better today with the medication adjustments made yesterday. PT/OT will see the patient today again.  Problem: Education: Goal: Knowledge of General Education information will improve Description Including pain rating scale, medication(s)/side effects and non-pharmacologic comfort measures Outcome: Progressing   Problem: Health Behavior/Discharge Planning: Goal: Ability to manage health-related needs will improve Outcome: Progressing   Problem: Clinical Measurements: Goal: Ability to maintain clinical measurements within normal limits will improve Outcome: Progressing Goal: Will remain free from infection Outcome: Progressing Goal: Diagnostic test results will improve Outcome: Progressing Goal: Respiratory complications will improve Outcome: Progressing Goal: Cardiovascular complication will be avoided Outcome: Progressing   Problem: Activity: Goal: Risk for activity intolerance will decrease Outcome: Progressing   Problem: Nutrition: Goal: Adequate nutrition will be maintained Outcome: Progressing   Problem: Coping: Goal: Level of anxiety will decrease Outcome: Progressing   Problem: Elimination: Goal: Will not experience complications related to bowel motility Outcome: Progressing Goal: Will not experience complications related to urinary retention Outcome: Progressing   Problem: Pain Managment: Goal: General experience of comfort will improve Outcome: Progressing   Problem: Safety: Goal: Ability to remain free from injury will improve Outcome: Progressing   Problem: Skin Integrity: Goal: Risk for impaired skin integrity will decrease Outcome: Progressing

## 2019-01-09 NOTE — Clinical Social Work Note (Signed)
CSW spoke with patient and her mother this afternoon regarding bed offers received. Patient did not appear to like any of them and patient's mother stated "Let me talk to [facility], I think I can get them to take her." CSW has left message for Sue Lush at [facility]. York Spaniel MSW,LCSW [phone]

## 2019-01-09 NOTE — Progress Notes (Signed)
   01/09/19 1425  Clinical Encounter Type  Visited With Patient  Visit Type Follow-up;Spiritual support;Social support  Spiritual Encounters  Spiritual Needs Grief support;Emotional  F/u with pt post-surgery. Pt was cheerful and in an upbeat mood as she shared about how much support she has received from chaplains, friends,  and social media. Pt was concerned about her exit plan and I encouraged her to let her care team know that she will need to find a place that would suit her needs to do rehab. Pt spoke briefly about how giving she has been in the past and how much courage it took her to reach out to others during her time of need. Pt was appreciative of my compassionate presence and care shown to her during such a major life change.

## 2019-01-09 NOTE — Progress Notes (Deleted)
F/u with pt post-surgery. Pt was cheerful and in an upbeat mood as she shared about how much support she has received from chaplains, friends,  and social media. Pt was concerned about her exit plan and I encouraged her to let her care team know that she will need to find a place that would suit her needs to do rehab. Pt spoke briefly about how giving she has been in the past and how much courage it took her to reach out to others during her time of need. Pt was appreciative of my compassionate presence and care shown to her during such a major life change.

## 2019-01-10 LAB — GLUCOSE, CAPILLARY
GLUCOSE-CAPILLARY: 156 mg/dL — AB (ref 70–99)
Glucose-Capillary: 141 mg/dL — ABNORMAL HIGH (ref 70–99)
Glucose-Capillary: 196 mg/dL — ABNORMAL HIGH (ref 70–99)
Glucose-Capillary: 162 mg/dL — ABNORMAL HIGH (ref 70–99)

## 2019-01-10 MED ORDER — MORPHINE SULFATE (PF) 2 MG/ML IV SOLN
2.0000 mg | INTRAVENOUS | Status: DC | PRN
  Administered 2019-01-10 – 2019-01-14 (×8): 2 mg via INTRAVENOUS
  Filled 2019-01-10 (×8): qty 1

## 2019-01-10 NOTE — Progress Notes (Signed)
[facility]  Daily Progress Note   Subjective  - 3 Days Post-Op  More pain today.  Off Dilaudid now.  No major events  Objective Vitals:   01/09/19 2216 01/10/19 0549 01/10/19 0910 01/10/19 1302  BP: 136/67 135/69 (!) 142/66 129/72  Pulse: (!) 112 (!) 112 (!) 115 (!) 107  Resp:  18  16  Temp:  99.7 F (37.6 C)  98.2 F (36.8 C)  TempSrc:  Oral  Oral  SpO2:  93%  93%  Weight:  121.6 kg    Height:        Intake/Output Summary (Last 24 hours) at 01/10/2019 1435 Last data filed at 01/10/2019 0920 Gross per 24 hour  Intake 240 ml  Output 1250 ml  Net -1010 ml    PULM  CTAB CV  tachycardic VASC  Dressing C/D/I  Laboratory CBC    Component Value Date/Time   WBC 11.3 (H) 01/09/2019 0354   HGB 7.5 (L) 01/09/2019 0354   HCT 25.4 (L) 01/09/2019 0354   PLT 311 01/09/2019 0354    BMET    Component Value Date/Time   NA 134 (L) 01/09/2019 0354   K 4.1 01/09/2019 0354   CL 99 01/09/2019 0354   CO2 29 01/09/2019 0354   GLUCOSE 111 (H) 01/09/2019 0354   BUN 15 01/09/2019 0354   CREATININE 0.87 01/09/2019 0354   CALCIUM 8.6 (L) 01/09/2019 0354   GFRNONAA >60 01/09/2019 0354   GFRAA >60 01/09/2019 0354    Assessment/Planning: POD #3 s/p left BKA   Plan to remove dressing tomorrow  Could likely go to rehab on Saturday  We will give her intermittent IV morphine and try to use oral pain medications as much as possible.    Festus Barren  01/10/2019, 2:35 PM

## 2019-01-10 NOTE — Care Management (Addendum)
At this time patient, and mother have not made a decision on which SNF they would like to pursue.  They are working on "other options".  RNCM notified patient and mother that per MD plan is to discharge over the weekend.  RNCM recommended to arrange a "back up plan" for home health services, should they not make a decision on facility and patient discharge over the weekend.  Both patient and her mother decline to move forward with home health referral.  Mother states "I am going to wait to hear back from my connection and let you know tomorrow".   Update.  Notified by [place] from [facility] that patient is open for home health RN

## 2019-01-10 NOTE — Progress Notes (Addendum)
Dr. Wyn Quaker messaged- Pain medication to be given yet complains of pain in surgical region and wanted to now if the doctor could be able to see her at one point today.

## 2019-01-10 NOTE — Consult Note (Signed)
PHARMACY CONSULT NOTE - FOLLOW UP  Pharmacy Consult for Electrolyte Monitoring and Replacement   Recent Labs: Potassium (mmol/L)  Date Value  01/09/2019 4.1   Magnesium (mg/dL)  Date Value  82/95/6213 1.7   Calcium (mg/dL)  Date Value  08/65/7846 8.6 (L)   Albumin (g/dL)  Date Value  96/29/5284 1.2 (L)   Phosphorus (mg/dL)  Date Value  13/24/4010 3.4   Sodium (mmol/L)  Date Value  01/09/2019 134 (L)   Correct Calcium: 10.8  Assessment: Pharmacy consulted for electrolyte monitoring and replacement in 58 yo female admitted with LLE pain and ulceration. Patient had hypomagnesemia and hypokalemia on admission.   Goal of Therapy:  Electrolytes WNL   Plan:  No electrolyte replacement required. All electrolytes wnl.  Will F/U up with AM labs on 1/16 and continue to replace as needed.   Lowella Bandy, PharmD Clinical Pharmacist 01/10/2019 8:23 AM

## 2019-01-10 NOTE — Progress Notes (Signed)
Physical Therapy Treatment Patient Details Name: [name] MRN: [id number] DOB: 24-Jan-1961 Today's Date: 01/10/2019    History of Present Illness Pt is a 58 yo female with PMHx including PVD, anxiety, GERD, DM, HTN, CRF s/p extensive LLE revascularization during this hospitalization. Angiogram shows minimal flow through the calf even after angioplasty and thrombectomy. Concern for compartment syndrome and pt now s/p LLE fasciotomies (01/01/2019).  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   PT Comments    [name] made good progress toward mobility goals and demonstrated ability to perform sit<>stand from elevated bed height x3 with min +2 assist.  Pt put forth excellent effort. Deferred attempting stand pivot or ambulation this session as pt had an assisted fall earlier this afternoon when attempting stand pivot transfer with nursing staff. Follow up recommendations remain appropriate.    Follow Up Recommendations  SNF     Equipment Recommendations  Other (comment)(TBD at next venue of care, will likely need WC)    Recommendations for Other Services       Precautions / Restrictions Precautions Precautions: Fall;Other (comment) Precaution Comments: R femoral central line Restrictions Weight Bearing Restrictions: Yes LLE Weight Bearing: Non weight bearing    Mobility  Bed Mobility Overal bed mobility: Needs Assistance Bed Mobility: Supine to Sit;Sit to Supine     Supine to sit: HOB elevated;Min guard Sit to supine: Min guard;HOB elevated   General bed mobility comments: Increased time and effort with HOB elevated. Pt demonstrates fatigue with bed mobility.   Transfers Overall transfer level: Needs assistance Equipment used: Rolling walker (2 wheeled) Transfers: Sit to/from Stand Sit to Stand: Min assist;+2 physical assistance;From elevated surface         General transfer comment: Cues for proper hand placement and R foot placement.  Bed elevated.  Assist to rise and for controlled  descent to sit.  Performed sit<>stand x3 from elevated bed.   Ambulation/Gait             General Gait Details: Not safe to attempt at this time.   Stairs             Wheelchair Mobility    Modified Rankin (Stroke Patients Only)       Balance Overall balance assessment: Needs assistance Sitting-balance support: No upper extremity supported;Feet supported Sitting balance-Leahy Scale: Good     Standing balance support: Bilateral upper extremity supported;During functional activity Standing balance-Leahy Scale: Poor Standing balance comment: Pt relies on BUE support for static and dynamic activity                            Cognition Arousal/Alertness: Awake/alert Behavior During Therapy: WFL for tasks assessed/performed Overall Cognitive Status: Within Functional Limits for tasks assessed                                        Exercises General Exercises - Lower Extremity Quad Sets: Strengthening;Left;10 reps;Supine Long Arc Quad: Strengthening;Left;10 reps;Seated Hip ABduction/ADduction: AROM;Left;10 reps;Supine Straight Leg Raises: Strengthening;Left;10 reps;Supine Other Exercises Other Exercises: Sit<>stand from elevated bed x3 with min +2 assist with seated rest break between.  Pt stands for ~15 seconds each rep.     General Comments General comments (skin integrity, edema, etc.): HR ranging from low 100s at rest to 123 with sit<>stand.       Pertinent Vitals/Pain Pain Assessment: Faces Faces Pain Scale:  Hurts even more Pain Location: distal LLE Pain Descriptors / Indicators: Grimacing;Guarding Pain Intervention(s): Limited activity within patient's tolerance;Monitored during session;Repositioned;Premedicated before session;Utilized relaxation techniques    Home Living                      Prior Function            PT Goals (current goals can now be found in the care plan section) Acute Rehab PT  Goals Patient Stated Goal: to improve independence PT Goal Formulation: With patient Time For Goal Achievement: 01/22/19 Potential to Achieve Goals: Good Progress towards PT goals: Progressing toward goals    Frequency    7X/week      PT Plan Current plan remains appropriate    Co-evaluation              AM-PAC PT "6 Clicks" Mobility   Outcome Measure  Help needed turning from your back to your side while in a flat bed without using bedrails?: A Little Help needed moving from lying on your back to sitting on the side of a flat bed without using bedrails?: A Little Help needed moving to and from a bed to a chair (including a wheelchair)?: A Little Help needed standing up from a chair using your arms (e.g., wheelchair or bedside chair)?: A Lot Help needed to walk in hospital room?: Total Help needed climbing 3-5 steps with a railing? : Total 6 Click Score: 13    End of Session Equipment Utilized During Treatment: Gait belt Activity Tolerance: Patient limited by fatigue Patient left: with call bell/phone within reach;in bed;with bed alarm set;with SCD's reapplied Nurse Communication: Mobility status PT Visit Diagnosis: Pain;Unsteadiness on feet (R26.81);Other abnormalities of gait and mobility (R26.89);Difficulty in walking, not elsewhere classified (R26.2);Muscle weakness (generalized) (M62.81) Pain - Right/Left: Left Pain - part of body: Leg     Time: [id number] PT Time Calculation (min) (ACUTE ONLY): 32 min  Charges:  $Therapeutic Activity: 23-37 mins                     Encarnacion ChuAshley Enis Riecke PT, DPT 01/10/2019, 4:27 PM

## 2019-01-10 NOTE — Clinical Social Work Note (Addendum)
CSW contacted Sue Lush at [facility] on behalf of patient's mother and Sue Lush stated that they would not have any beds available nor do they take patient's insurance. Sue Lush stated that coming to [facility] would not be an option. CSW informed patient's mother earlier today and informed her of this. CSW asked her if she had time to think about the three facilities that are able to offer. Patient's mother stated she would think about it and get back to me. CSW followed up with patient's mother via phone late this afternoon and asked if she had made a decision as of yet. Patient's mother stated "we have something in the works because she isn't going to any of the 3 facilities that offered." CSW attempted to explain that an answer would be needed due to insurance needing to know and that we were nearing discharge. Patient's mother stated that Dr. Wyn Quaker had spoken with patient and told her she could have a couple more days here in the hospital. CSW asked patient's mother what facilities she was working on and that maybe CSW could help. Patient's mother didn't want to tell CSW. CSW then discussed the fact that we would need to go ahead and discuss arranging home health. Patient's mother stated that she does not want to arrange home health. Patient's mother was very angry with CSW because she was not wanting to make any decision and was "working" on other options but would not inform CSW. York Spaniel MSW,LCSW [phone]

## 2019-01-10 NOTE — Care Management Important Message (Signed)
Copy of signed Medicare IM left with patient in room. 

## 2019-01-10 NOTE — Progress Notes (Addendum)
The Patient was using the bedside commode and was not able to get back in the bed, with the assistance of the nurse and nurse tech. So she was assisted to sit to the floor.  When getting up she hit the stump of her leg. Dr. Wyn Quaker was notified and indicated that if the dressing did not come off they will evaluate the site tomorrow. The patient is back in the bed. Vital signs were obtained. Assisted fall to be documented.

## 2019-01-10 NOTE — Progress Notes (Signed)
Pain was around site where she hit the stump as mentione previously when getting up from the floor.  Dr. Wyn Quakerew will evaluate site tomorrow.

## 2019-01-11 ENCOUNTER — Inpatient Hospital Stay: Payer: Medicare HMO

## 2019-01-11 ENCOUNTER — Other Ambulatory Visit: Payer: Self-pay

## 2019-01-11 DIAGNOSIS — Z9889 Other specified postprocedural states: Secondary | ICD-10-CM

## 2019-01-11 DIAGNOSIS — Z89512 Acquired absence of left leg below knee: Secondary | ICD-10-CM

## 2019-01-11 LAB — URINALYSIS, COMPLETE (UACMP) WITH MICROSCOPIC
Bilirubin Urine: NEGATIVE
Glucose, UA: NEGATIVE mg/dL
Ketones, ur: NEGATIVE mg/dL
Nitrite: POSITIVE — AB
Protein, ur: NEGATIVE mg/dL
Specific Gravity, Urine: 1.012 (ref 1.005–1.030)
pH: 5 (ref 5.0–8.0)

## 2019-01-11 LAB — BASIC METABOLIC PANEL
Anion gap: 6 (ref 5–15)
BUN: 18 mg/dL (ref 6–20)
CO2: 28 mmol/L (ref 22–32)
Calcium: 8.5 mg/dL — ABNORMAL LOW (ref 8.9–10.3)
Chloride: 100 mmol/L (ref 98–111)
Creatinine, Ser: 0.85 mg/dL (ref 0.44–1.00)
GFR calc Af Amer: >60 mL/min (ref >60)
GFR calc non Af Amer: >60 mL/min (ref >60)
GLUCOSE: 152 mg/dL — AB (ref 70–99)
Potassium: 4.5 mmol/L (ref 3.5–5.1)
Sodium: 134 mmol/L — ABNORMAL LOW (ref 135–145)

## 2019-01-11 LAB — TROPONIN I
Troponin I: <0.03 ng/mL (ref <0.03)
Troponin I: 0.03 ng/mL (ref <0.03)

## 2019-01-11 LAB — GLUCOSE, CAPILLARY
GLUCOSE-CAPILLARY: 92 mg/dL (ref 70–99)
Glucose-Capillary: 123 mg/dL — ABNORMAL HIGH (ref 70–99)
Glucose-Capillary: 123 mg/dL — ABNORMAL HIGH (ref 70–99)
Glucose-Capillary: 152 mg/dL — ABNORMAL HIGH (ref 70–99)

## 2019-01-11 LAB — LACTIC ACID, PLASMA
Lactic Acid, Venous: 0.9 mmol/L (ref 0.5–1.9)
Lactic Acid, Venous: 1.3 mmol/L (ref 0.5–1.9)

## 2019-01-11 LAB — APTT: aPTT: 37 seconds — ABNORMAL HIGH (ref 24–36)

## 2019-01-11 LAB — CBC
HCT: 23.6 % — ABNORMAL LOW (ref 36.0–46.0)
Hemoglobin: 7 g/dL — ABNORMAL LOW (ref 12.0–15.0)
MCH: 26.2 pg (ref 26.0–34.0)
MCHC: 29.7 g/dL — ABNORMAL LOW (ref 30.0–36.0)
MCV: 88.4 fL (ref 80.0–100.0)
Platelets: 255 10*3/uL (ref 150–400)
RBC: 2.67 MIL/uL — ABNORMAL LOW (ref 3.87–5.11)
RDW: 19.4 % — AB (ref 11.5–15.5)
WBC: 14 10*3/uL — ABNORMAL HIGH (ref 4.0–10.5)
nRBC: 0 % (ref 0.0–0.2)

## 2019-01-11 LAB — PROCALCITONIN: Procalcitonin: 0.4 ng/mL

## 2019-01-11 LAB — MAGNESIUM: Magnesium: 1.3 mg/dL — ABNORMAL LOW (ref 1.7–2.4)

## 2019-01-11 LAB — PROTIME-INR
INR: 1.41
Prothrombin Time: 17.1 seconds — ABNORMAL HIGH (ref 11.4–15.2)

## 2019-01-11 LAB — PREPARE RBC (CROSSMATCH)

## 2019-01-11 MED ORDER — CLONAZEPAM 0.25 MG PO TBDP: 0.2500 mg | ORAL_TABLET | Freq: Two times a day (BID) | ORAL | 5 refills | Status: AC | PRN

## 2019-01-11 MED ORDER — IPRATROPIUM-ALBUTEROL 0.5-2.5 (3) MG/3ML IN SOLN
3.0000 mL | RESPIRATORY_TRACT | Status: DC | PRN
  Administered 2019-01-11: 3 mL via RESPIRATORY_TRACT
  Filled 2019-01-11: qty 3

## 2019-01-11 MED ORDER — ONDANSETRON HCL 4 MG/2ML IJ SOLN: 4.0000 mg | Freq: Four times a day (QID) | INTRAMUSCULAR | 0 refills | Status: DC | PRN

## 2019-01-11 MED ORDER — ENSURE MAX PROTEIN PO LIQD: 11.0000 [oz_av] | Freq: Two times a day (BID) | ORAL | 1 refills | Status: DC

## 2019-01-11 MED ORDER — IPRATROPIUM-ALBUTEROL 0.5-2.5 (3) MG/3ML IN SOLN
3.0000 mL | Freq: Four times a day (QID) | RESPIRATORY_TRACT | Status: DC
  Administered 2019-01-11 – 2019-01-12 (×2): 3 mL via RESPIRATORY_TRACT
  Filled 2019-01-11 (×3): qty 3

## 2019-01-11 MED ORDER — METOPROLOL TARTRATE 5 MG/5ML IV SOLN
5.0000 mg | Freq: Once | INTRAVENOUS | Status: AC
  Administered 2019-01-11: 5 mg via INTRAVENOUS
  Filled 2019-01-11: qty 5

## 2019-01-11 MED ORDER — INSULIN GLARGINE 100 UNIT/ML ~~LOC~~ SOLN: 35.0000 [IU] | Freq: Every day | SUBCUTANEOUS | 11 refills | Status: AC

## 2019-01-11 MED ORDER — INSULIN ASPART 100 UNIT/ML ~~LOC~~ SOLN: 0.0000 [IU] | Freq: Three times a day (TID) | SUBCUTANEOUS | 11 refills | Status: DC

## 2019-01-11 MED ORDER — FUROSEMIDE 10 MG/ML IJ SOLN
20.0000 mg | Freq: Two times a day (BID) | INTRAMUSCULAR | Status: DC
  Administered 2019-01-12 – 2019-01-17 (×9): 20 mg via INTRAVENOUS
  Filled 2019-01-11 (×9): qty 4

## 2019-01-11 MED ORDER — INSULIN ASPART 100 UNIT/ML ~~LOC~~ SOLN: 0.0000 [IU] | Freq: Every day | SUBCUTANEOUS | 11 refills | Status: DC

## 2019-01-11 MED ORDER — PREGABALIN 75 MG PO CAPS: 75.0000 mg | ORAL_CAPSULE | Freq: Two times a day (BID) | ORAL | 1 refills | Status: DC

## 2019-01-11 MED ORDER — APIXABAN 5 MG PO TABS: 5.0000 mg | ORAL_TABLET | Freq: Two times a day (BID) | ORAL | Status: DC

## 2019-01-11 MED ORDER — SODIUM CHLORIDE 0.9 % IV SOLN
2.0000 g | Freq: Once | INTRAVENOUS | Status: AC
  Administered 2019-01-11: 2 g via INTRAVENOUS
  Filled 2019-01-11: qty 2

## 2019-01-11 MED ORDER — NICOTINE POLACRILEX 4 MG MT GUM: 4.0000 mg | CHEWING_GUM | OROMUCOSAL | 0 refills | Status: DC | PRN

## 2019-01-11 MED ORDER — SODIUM CHLORIDE 0.9 % IV SOLN
2.0000 g | Freq: Two times a day (BID) | INTRAVENOUS | Status: DC
  Filled 2019-01-11 (×2): qty 2

## 2019-01-11 MED ORDER — SODIUM CHLORIDE 1 G PO TABS: 2.0000 g | ORAL_TABLET | Freq: Two times a day (BID) | ORAL | 0 refills | Status: DC

## 2019-01-11 MED ORDER — MAGNESIUM SULFATE 2 GM/50ML IV SOLN
2.0000 g | Freq: Once | INTRAVENOUS | Status: AC
  Administered 2019-01-11: 2 g via INTRAVENOUS
  Filled 2019-01-11: qty 50

## 2019-01-11 MED ORDER — SODIUM CHLORIDE 0.9 % IV SOLN: 1.0000 g | INTRAVENOUS | Status: DC

## 2019-01-11 MED ORDER — SODIUM CHLORIDE 0.9% IV SOLUTION
Freq: Once | INTRAVENOUS | Status: AC
  Administered 2019-01-11: 17:00:00 via INTRAVENOUS

## 2019-01-11 MED ORDER — LOPERAMIDE HCL 2 MG PO CAPS: 2.0000 mg | ORAL_CAPSULE | Freq: Four times a day (QID) | ORAL | 0 refills | Status: DC | PRN

## 2019-01-11 MED ORDER — IBUPROFEN 400 MG PO TABS
400.0000 mg | ORAL_TABLET | Freq: Four times a day (QID) | ORAL | Status: DC | PRN
  Administered 2019-01-11: 400 mg via ORAL
  Filled 2019-01-11: qty 1

## 2019-01-11 MED ORDER — SENNA 8.6 MG PO TABS: 1.0000 | ORAL_TABLET | Freq: Every day | ORAL | Status: DC | PRN

## 2019-01-11 MED ORDER — OXYCODONE HCL 5 MG PO TABS: 5.0000 mg | ORAL_TABLET | ORAL | 0 refills | Status: DC | PRN

## 2019-01-11 MED ORDER — GABAPENTIN 600 MG PO TABS: 1200.0000 mg | ORAL_TABLET | Freq: Three times a day (TID) | ORAL | 5 refills | Status: DC

## 2019-01-11 MED ORDER — BISACODYL 5 MG PO TBEC: 5.0000 mg | DELAYED_RELEASE_TABLET | Freq: Every day | ORAL | Status: DC | PRN

## 2019-01-11 MED ORDER — OCUVITE-LUTEIN PO CAPS: 1.0000 | ORAL_CAPSULE | Freq: Every day | ORAL | 11 refills | Status: DC

## 2019-01-11 MED ORDER — FUROSEMIDE 10 MG/ML IJ SOLN: 20.0000 mg | Freq: Two times a day (BID) | INTRAMUSCULAR | Status: DC

## 2019-01-11 MED ORDER — INSULIN ASPART 100 UNIT/ML ~~LOC~~ SOLN: 4.0000 [IU] | Freq: Three times a day (TID) | SUBCUTANEOUS | 11 refills | Status: AC

## 2019-01-11 MED ORDER — FUROSEMIDE 10 MG/ML IJ SOLN
20.0000 mg | Freq: Once | INTRAMUSCULAR | Status: AC
  Administered 2019-01-11: 20 mg via INTRAVENOUS
  Filled 2019-01-11: qty 4

## 2019-01-11 MED ORDER — ACETAMINOPHEN 650 MG RE SUPP: 650.0000 mg | Freq: Four times a day (QID) | RECTAL | 0 refills | Status: DC | PRN

## 2019-01-11 MED ORDER — LISINOPRIL 2.5 MG PO TABS
2.5000 mg | ORAL_TABLET | Freq: Every day | ORAL | Status: DC
  Administered 2019-01-11 – 2019-01-18 (×7): 2.5 mg via ORAL
  Filled 2019-01-11 (×8): qty 1

## 2019-01-11 MED ORDER — NICOTINE 14 MG/24HR TD PT24: 14.0000 mg | MEDICATED_PATCH | Freq: Every day | TRANSDERMAL | 0 refills | Status: DC

## 2019-01-11 MED ORDER — ACETAMINOPHEN 325 MG PO TABS: 650.0000 mg | ORAL_TABLET | Freq: Four times a day (QID) | ORAL | Status: DC | PRN

## 2019-01-11 NOTE — H&P (Addendum)
[facility] -  at [facility]   PATIENT NAME: [name]    MR#:  [id number]  DATE OF BIRTH:  [birth year]  DATE OF ADMISSION:  12/31/2018  PRIMARY CARE PHYSICIAN: Dortha Kern, MD   REQUESTING/REFERRING PHYSICIAN: Dr. Wyn Quaker  CHIEF COMPLAINT:  No chief complaint on file.  Shortness of breath and hypoxia HISTORY OF PRESENT ILLNESS:  Rivka Fabio  is a 58 y.o. female with a known history of multiple medical problems as below.  The patient is admitted under vascular surgical service and had left lower extremity below-knee amputation.  The patient developed shortness of breath and hypoxia, put on oxygen by nasal cannula 3 L today.  Dr. Driscilla Grammes PA requested consult.  The patient complains of shortness of breath and cough but denies any fever, chills, nausea, vomiting or diarrhea.  She has lower abdominal pain since she cannot pee.  Bladder scan show 410 cc urine.  Her hemoglobin decreased to 7.0, magnesium is low at 1.3.  Chest x-ray show congestion. PAST MEDICAL HISTORY:   Past Medical History:  Diagnosis Date  . Acute pain of right shoulder 05/19/2016  . Acute PN (pyelonephritis) 05/18/2016  . Acute pyelonephritis 05/18/2016  . Anxiety   . Arthritis   . Asthma   . Diabetes mellitus without complication (HCC)   . GERD (gastroesophageal reflux disease)   . Glaucoma   . Hyperlipemia   . Hyperlipemia   . Hypertension   . Peripheral vascular disease (HCC)     PAST SURGICAL HISTORY:   Past Surgical History:  Procedure Laterality Date  . AMPUTATION Left 01/07/2019   Procedure: AMPUTATION BELOW KNEE;  Surgeon: Annice Needy, MD;  Location: [facility] ORS;  Service: Vascular;  Laterality: Left;  . ANTERIOR CRUCIATE LIGAMENT REPAIR    . FASCIOTOMY Left 01/01/2019   Procedure: FASCIOTOMY LEFT LEG;  Surgeon: Annice Needy, MD;  Location: [facility] ORS;  Service: Vascular;  Laterality: Left;  . LOWER EXTREMITY ANGIOGRAPHY Left 12/17/2018   Procedure: LOWER EXTREMITY ANGIOGRAPHY;   Surgeon: Annice Needy, MD;  Location: [facility] INVASIVE CV LAB;  Service: Cardiovascular;  Laterality: Left;  . LOWER EXTREMITY ANGIOGRAPHY Left 12/31/2018   Procedure: Lower Extremity Angiography;  Surgeon: Annice Needy, MD;  Location: [facility] INVASIVE CV LAB;  Service: Cardiovascular;  Laterality: Left;  . LOWER EXTREMITY ANGIOGRAPHY Left 01/01/2019   Procedure: Lower Extremity Angiography;  Surgeon: Annice Needy, MD;  Location: [facility] INVASIVE CV LAB;  Service: Cardiovascular;  Laterality: Left;    SOCIAL HISTORY:   Social History   Tobacco Use  . Smoking status: Current Every Day Smoker    Packs/day: 0.50  . Smokeless tobacco: Never Used  Substance Use Topics  . Alcohol use: No    FAMILY HISTORY:   Family History  Problem Relation Age of Onset  . Diabetes Mother   . Heart disease Mother   . Stroke Mother   . Kidney cancer Neg Hx   . Prostate cancer Neg Hx     DRUG ALLERGIES:   Allergies  Allergen Reactions  . Biaxin [Clarithromycin] Rash    Patient states this medication gives her severe rash and thrush in the mouth.    REVIEW OF SYSTEMS:   Review of Systems  Constitutional: Positive for malaise/fatigue. Negative for chills and fever.  HENT: Negative for sore throat.   Eyes: Negative for blurred vision and double vision.  Respiratory: Positive for cough and shortness of breath. Negative for hemoptysis, sputum production, wheezing and stridor.  Cardiovascular: Negative for chest pain, palpitations, orthopnea and leg swelling.  Gastrointestinal: Negative for abdominal pain, blood in stool, diarrhea, melena, nausea and vomiting.  Genitourinary: Negative for dysuria, flank pain and hematuria.       Urinary retention today.  Musculoskeletal: Negative for back pain and joint pain.  Skin: Negative for rash.  Neurological: Negative for dizziness, sensory change, focal weakness, seizures, loss of consciousness, weakness and headaches.  Endo/Heme/Allergies: Negative for polydipsia.    Psychiatric/Behavioral: Negative for depression. The patient is not nervous/anxious.     MEDICATIONS AT HOME:   Prior to Admission medications   Medication Sig Start Date End Date Taking? Authorizing Provider  albuterol (PROAIR HFA) 108 (90 Base) MCG/ACT inhaler  03/11/17  Yes [provider]  atorvastatin (LIPITOR) 20 MG tablet Take 20 mg by mouth at bedtime. 04/16/16  Yes [provider]  cholecalciferol (VITAMIN D) 1000 units tablet Take 5,000 Units by mouth daily.   Yes [provider]  clopidogrel (PLAVIX) 75 MG tablet Take 1 tablet (75 mg total) by mouth daily. 12/17/18  Yes Annice Needy, MD  fluticasone Aleda Grana) 50 MCG/ACT nasal spray  09/19/17  Yes [provider]  furosemide (LASIX) 20 MG tablet Take 1 tablet (20 mg total) by mouth daily. 05/02/16  Yes Gouru, Aruna, MD  insulin glargine (LANTUS) 100 UNIT/ML injection Inject 70 Units into the skin daily.   Yes [provider]  insulin lispro (HUMALOG) 100 UNIT/ML injection Inject 15 Units into the skin 3 (three) times daily before meals.    Yes [provider]  latanoprost (XALATAN) 0.005 % ophthalmic solution Place 1 drop into both eyes at bedtime. 05/02/16  Yes Gouru, Deanna Artis, MD  metFORMIN (GLUCOPHAGE-XR) 500 MG 24 hr tablet Take 1 tablet by mouth 2 (two) times daily. 01/11/18  Yes [provider]  metoprolol tartrate (LOPRESSOR) 25 MG tablet Take 1 tablet (25 mg total) by mouth 2 (two) times daily. 05/02/16  Yes Gouru, Deanna Artis, MD  nortriptyline (PAMELOR) 50 MG capsule  11/13/18  Yes [provider]  NOVOLOG FLEXPEN 100 UNIT/ML FlexPen  11/06/18  Yes [provider]  oxybutynin (DITROPAN-XL) 10 MG 24 hr tablet Take 10 mg by mouth at bedtime. 02/22/16  Yes [provider]  pantoprazole (PROTONIX) 20 MG tablet Take 20 mg by mouth daily.   Yes [provider]  traZODone (DESYREL) 50 MG tablet Take 1 tablet (50 mg total) by mouth at bedtime. 05/02/16   Yes Gouru, Deanna Artis, MD  acetaminophen (TYLENOL) 325 MG tablet Take 2 tablets (650 mg total) by mouth every 6 (six) hours as needed for mild pain (or Fever >/= 101). 01/11/19   Stegmayer, Ranae Plumber, PA-C  acetaminophen (TYLENOL) 650 MG suppository Place 1 suppository (650 mg total) rectally every 6 (six) hours as needed for mild pain (or Fever >/= 101). 01/11/19   Stegmayer, Ranae Plumber, PA-C  apixaban (ELIQUIS) 5 MG TABS tablet Take 1 tablet (5 mg total) by mouth 2 (two) times daily. 01/11/19   Stegmayer, Ranae Plumber, PA-C  Ascorbic Acid (VITAMIN C) 1000 MG tablet Take 1,000 mg by mouth daily.    [provider]  aspirin EC 81 MG tablet Take 1 tablet (81 mg total) by mouth daily. Patient not taking: Reported on 12/31/2018 12/17/18   Annice Needy, MD  Calcium Carbonate-Vitamin D3 (CALCIUM 600-D) 600-400 MG-UNIT TABS Take by mouth 2 (two) times daily.    [provider]  clonazepam (KLONOPIN) 0.25 MG disintegrating tablet Take 1 tablet (  0.25 mg total) by mouth 2 (two) times daily as needed (Anxiety). 01/11/19   Stegmayer, Cala BradfordKimberly A, PA-C  DULoxetine (CYMBALTA) 20 MG capsule Take 60 mg by mouth daily.     [provider]  ENSURE MAX PROTEIN (ENSURE MAX PROTEIN) LIQD Take 330 mLs (11 oz total) by mouth 2 (two) times daily. 01/11/19   Stegmayer, Cala BradfordKimberly A, PA-C  gabapentin (NEURONTIN) 600 MG tablet Take 2 tablets (1,200 mg total) by mouth 3 (three) times daily. 01/11/19   Stegmayer, Cala BradfordKimberly A, PA-C  insulin aspart (NOVOLOG) 100 UNIT/ML injection Inject 0-20 Units into the skin 3 (three) times daily with meals. 01/11/19   Stegmayer, Cala BradfordKimberly A, PA-C  insulin aspart (NOVOLOG) 100 UNIT/ML injection Inject 0-5 Units into the skin at bedtime. 01/11/19   Stegmayer, Cala BradfordKimberly A, PA-C  insulin aspart (NOVOLOG) 100 UNIT/ML injection Inject 4 Units into the skin 3 (three) times daily with meals. 01/11/19   Stegmayer, Cala BradfordKimberly A, PA-C  insulin glargine (LANTUS) 100 UNIT/ML injection Inject 0.35 mLs (35  Units total) into the skin daily. 01/12/19   Stegmayer, Cala BradfordKimberly A, PA-C  loperamide (IMODIUM) 2 MG capsule Take 1 capsule (2 mg total) by mouth every 6 (six) hours as needed for diarrhea or loose stools. 01/11/19   Stegmayer, Cala BradfordKimberly A, PA-C  multivitamin-lutein (OCUVITE-LUTEIN) CAPS capsule Take 1 capsule by mouth daily. 01/12/19   Stegmayer, Ranae PlumberKimberly A, PA-C  nicotine (NICODERM CQ - DOSED IN MG/24 HOURS) 14 mg/24hr patch Place 1 patch (14 mg total) onto the skin daily. 01/12/19   Stegmayer, Ranae PlumberKimberly A, PA-C  nicotine polacrilex (NICORETTE) 4 MG gum Take 1 each (4 mg total) by mouth as needed for smoking cessation. 01/11/19   Stegmayer, Cala BradfordKimberly A, PA-C  ondansetron (ZOFRAN) 4 MG/2ML SOLN injection Inject 2 mLs (4 mg total) into the vein every 6 (six) hours as needed for nausea or vomiting. 01/11/19   Stegmayer, Cala BradfordKimberly A, PA-C  oxyCODONE (OXY IR/ROXICODONE) 5 MG immediate release tablet Take 1-2 tablets (5-10 mg total) by mouth every 4 (four) hours as needed for moderate pain. 01/11/19   Stegmayer, Ranae PlumberKimberly A, PA-C  oxyCODONE-acetaminophen (PERCOCET) 10-325 MG tablet Take 1 tablet by mouth every 6 (six) hours as needed for pain.    [provider]  pregabalin (LYRICA) 75 MG capsule Take 1 capsule (75 mg total) by mouth 2 (two) times daily. 01/11/19   Stegmayer, Cala BradfordKimberly A, PA-C  sodium chloride 1 g tablet Take 2 tablets (2 g total) by mouth 2 (two) times daily with a meal. 01/11/19   Stegmayer, Cala BradfordKimberly A, PA-C      VITAL SIGNS:  Blood pressure 117/75, pulse (!) 116, temperature 98.6 F (37 C), temperature source Oral, resp. rate 20, height 5\' 3"  (1.6 m), weight 121.9 kg, SpO2 95 %.  PHYSICAL EXAMINATION:  Physical Exam  GENERAL:  58 y.o.-year-old patient lying in the bed with no acute distress.  Morbid obesity. EYES: Pupils equal, round, reactive to light and accommodation. No scleral icterus. Extraocular muscles intact.  HEENT: Head atraumatic, normocephalic. Oropharynx and nasopharynx  clear.  NECK:  Supple, no jugular venous distention. No thyroid enlargement, no tenderness.  LUNGS: Normal breath sounds bilaterally, no wheezing, rales,rhonchi or crepitation. No use of accessory muscles of respiration.  CARDIOVASCULAR: S1, S2 normal. No murmurs, rubs, or gallops.  ABDOMEN: Soft, nontender, nondistended. Bowel sounds present. No organomegaly or mass.  EXTREMITIES: No pedal edema, cyanosis, or clubbing.  Left BKA.  In dressing. NEUROLOGIC: Cranial nerves II through XII are intact. Muscle strength  4/5 in all extremities. Sensation intact. Gait not checked.  PSYCHIATRIC: The patient is alert and oriented x 3.  SKIN: No obvious rash, lesion, or ulcer.   LABORATORY PANEL:   CBC Recent Labs  Lab 01/11/19 0747  WBC 14.0*  HGB 7.0*  HCT 23.6*  PLT 255   ------------------------------------------------------------------------------------------------------------------  Chemistries  Recent Labs  Lab 01/11/19 0747  NA 134*  K 4.5  CL 100  CO2 28  GLUCOSE 152*  BUN 18  CREATININE 0.85  CALCIUM 8.5*  MG 1.3*   ------------------------------------------------------------------------------------------------------------------  Cardiac Enzymes Recent Labs  Lab 01/11/19 1438  TROPONINI <0.03   ------------------------------------------------------------------------------------------------------------------  RADIOLOGY:  Dg Chest 2 View  Result Date: 01/11/2019 CLINICAL DATA:  Chest pain after fall. EXAM: CHEST - 2 VIEW COMPARISON:  Radiographs of April 02, 2018. FINDINGS: Stable cardiomegaly with central pulmonary vascular congestion. No pneumothorax or pleural effusion is noted. No consolidative process is noted. The visualized skeletal structures are unremarkable. IMPRESSION: Stable cardiomegaly with central pulmonary vascular congestion. Electronically Signed   By: Lupita RaiderJames  Green Jr, M.D.   On: 01/11/2019 13:05   Dg Knee 1-2 Views Left  Result Date:  01/11/2019 CLINICAL DATA:  Left knee pain after fall. EXAM: LEFT KNEE - 1-2 VIEW COMPARISON:  Radiographs of October 03, 2016. FINDINGS: Status post left below-knee amputation. Surgical staples remain. No other fracture or dislocation is noted. Joint spaces are intact. IMPRESSION: Status post left below-knee amputation. No other fracture or bony abnormality is noted. Electronically Signed   By: Lupita RaiderJames  Green Jr, M.D.   On: 01/11/2019 13:03   Dg Humerus Left  Result Date: 01/11/2019 CLINICAL DATA:  Pt reports that she had an assisted fall where she was helped to the ground by nurse and nurse aide. Pt now complaining of chest pain, left arm pain, knee pain and left hip pain. Pt had recent surgery to her left leg. EXAM: LEFT HUMERUS - 2+ VIEW COMPARISON:  None. FINDINGS: No fracture or bone lesion. Shoulder and elbow joints are normally aligned. Soft tissues are unremarkable. IMPRESSION: Negative. Electronically Signed   By: Amie Portlandavid  Ormond M.D.   On: 01/11/2019 13:01   Dg Hip Unilat With Pelvis 2-3 Views Left  Result Date: 01/11/2019 CLINICAL DATA:  Left hip pain after fall. EXAM: DG HIP (WITH OR WITHOUT PELVIS) 2-3V LEFT COMPARISON:  None. FINDINGS: There is no evidence of hip fracture or dislocation. There is no evidence of arthropathy or other focal bone abnormality. IMPRESSION: Negative. Electronically Signed   By: Lupita RaiderJames  Green Jr, M.D.   On: 01/11/2019 13:02      IMPRESSION AND PLAN:   Acute respiratory failure with hypoxia, possible due to combination of acute diastolic CHF, sepsis and anemia. Continue oxygen by nasal cannula, DuoNeb every 6 hours, incentive spirometer.  Acute diastolic CHF. Start Lasix IV every 12 hours.  CHF protocol.  The patient had echo this year which showed ejection fraction 50 to 60%.  Sepsis due to UTI with fever 102, tachycardia and leukocytosis. Started cefepime, follow-up blood culture and urine culture.  Anemia of chronic disease and acute blood loss possible due  to surgery. PRBC transfusion 1 unit, follow-up hemoglobin.  Diabetes.  Continue sliding scale, NovoLog 3 times daily and Lantus.  Hold metformin.  PVD, s/p left BKA.  Managed by vascular surgeon.  Continue Eliquis and Lipitor.  Morbid obesity.  Diet control and follow-up PCP.  Tobacco abuse.  Smoking cessation was counseled for 3 to 4 minutes. All the records are reviewed and  case discussed with ED provider. Management plans discussed with the patient, her mother and they are in agreement.  CODE STATUS: Full code.  TOTAL TIME TAKING CARE OF THIS PATIENT: 50 minutes.    Shaune Pollack M.D on 01/11/2019 at 3:20 PM  Between 7am to 6pm - Pager - [phone]  After 6pm go to [email or web address] - Social research officer, government  [facility] [place] [facility]  Office  [phone]  CC: Primary care physician; Dortha Kern, MD   Note: This dictation was prepared with Dragon dictation along with smaller phrase technology. Any transcriptional errors that result from this process are unin

## 2019-01-11 NOTE — Progress Notes (Signed)
Patient still somewhat tachypneic and hypoxic.  Appreciate medicine input with treatment for CHF and atelectasis planned.  Chest x-ray showed no pneumonia.  PE would be unlikely on anticoagulation. Patient also having urinary retention.  UTI is certainly possible as we left the catheter in longer due to her immobility.  Urine culture is pending. With a fever early this morning, discharge today is not prudent.  Discharge tomorrow is possible although I think unlikely given the clinical scenario.  Appreciate medicine input.  We will try to improve her cardiopulmonary status over the next 24 to 48 hours.

## 2019-01-11 NOTE — Progress Notes (Signed)
   01/11/19 1500  Clinical Encounter Type  Visited With Family  Visit Type Follow-up  Referral From Nurse  Pt requested an assistance with an AD. Ch explained and left a copy. Pt will request a further visit as needed.

## 2019-01-11 NOTE — Care Management (Signed)
Follow up with patient and mother.  Mother states they still have not heard back from their "connection" at the other facilities. RNCM confirmed that patient is currently open with [facility].   RNCM notified both patient and mother that CSW would need determination of which facility they would like to go to by today.   Mother states "what if she isn't medically ready to go by then".  RNCM states that she would then stay until she was medically cleared, and discharge to the facility they accept.   RNCM notified both patient and mother that if they did not make a decision on which facility they wanted and patient was medically cleared over the weekend it would leave them with 2 options  1. Discharge home with resumption of home health  orders  2. RNCM would present HINN which is a notice of non  coverage. RNCM explained that if there is not a medical  indication for the patient to stay in the hospital that  insurance would potentially deny the days she remains in  the hospital with out medical necessity.   Both patient and  mother ask the approximate charges per night.  RNCM  used the total current bill, divided by the length of day  which resulted in the $11,136.  RNCM notified both  patient and mother that this number was as of today, and  could change on a day to day basis  Mother states we need just a little longer to hear back from our person.  RNCM notified both patient and mother that CSW would be calling at 12:30 pm to see if they have reached a decision

## 2019-01-11 NOTE — Progress Notes (Signed)
Per MD okay for RN to place order for in and out catheter.

## 2019-01-11 NOTE — Progress Notes (Addendum)
Notified MD pt is short of breath per MD okay for RN to order breathing tx. Q4 PRN. Also her pt's mom requested to see the chaplain to create power of attorney. The chaplain has been notified.

## 2019-01-11 NOTE — Discharge Summary (Addendum)
[facility]    Discharge Summary  Patient ID:  [name] L Dorame MRN: [id number] DOB/AGE: Jul 09, 1961 58 y.o.  Admit date: 12/31/2018 Discharge date: 01/18/2019 Date of Surgery: 01/07/2019 Surgeon: Surgeon(s): Annice Needyew, Jason S, MD  Admission Diagnosis: Ischemic leg [I99.8]  Discharge Diagnoses:  Ischemic leg [I99.8]  Secondary Diagnoses: Past Medical History:  Diagnosis Date  . Acute pain of right shoulder 05/19/2016  . Acute PN (pyelonephritis) 05/18/2016  . Acute pyelonephritis 05/18/2016  . Anxiety   . Arthritis   . Asthma   . Diabetes mellitus without complication (HCC)   . GERD (gastroesophageal reflux disease)   . Glaucoma   . Hyperlipemia   . Hyperlipemia   . Hypertension   . Peripheral vascular disease (HCC)    Procedure(s): AMPUTATION BELOW KNEE (LEFT) Discharged Condition: Stable  Hospital Course:  The patient is a 58 year old female with multiple medical issues who presented to our clinic on 12/31/18 with an ischemic left lower extremity.  The patient was status post a left lower extremity endovascular intervention approximately one week prior for atherosclerotic disease with ulceration.  During her stay as an inpatient she underwent the following procedures:   12/31/18:Ultrasound guidance for vascular accessrightfemoral artery,Catheter placement into left common femoral artery from right femoral approach, Selectiveleftlower extremity angiogram,Placement of 8 mg of TPA with an infusion catheter to the left SFA and popliteal arteries,Placement of infusion catheter in left SFA, popliteal artery, and proximal posterior tibial artery for continuous infusion of TPA overnight,Placement of a right femoral venous triple-lumen catheter with ultrasound and fluoroscopy guidance  01/01/19: Angiogram through existing catheter left lower extremity,Catheter placement into left posterior tibial artery from right femoral approach,Mechanical thrombectomy to the  left SFA, popliteal artery, tibioperoneal trunk, and posterior tibial arteries with the penumbra cat 6 device,Percutaneous transluminal angioplasty ofleft posterior tibial artery and tibioperoneal trunk with 3 mm diameter by 30 cm length angioplasty balloonwithStarClose closure device rightfemoral artery  A few hours after the patient's second endovascular intervention her physical exam was notable for possible compartment syndrome and she was taken emergently to the operating room and underwent a:  01/01/19:Left leg / calf fasciotomies  Physical exam following the fasciotomies did not show any improvement of arterial perfusion to the left lower extremity.  The patient was not a candidate for any further endovascular interventions or bypass.  The patient's left lower extremity was not viable and subsequently she returned to the OR and underwent a:  01/07/19:Left below-the-knee amputation   The patient was treated with a heparin drip which was then transitioned to aspirin and Eliquis.  On January 10, 2019 the patient did accidentally fall while being transferred from her bed to the commode.  X-rays of the left lower extremity were notable for: Hip (01/11/19): There is no evidence of hip fracture or dislocation. There is no evidence of arthropathy or other focal bone abnormality. Humerus: (01/11/19): No fracture or bone lesion. Shoulder and elbow joints are normally aligned.  Soft tissues are unremarkable. Knee (01/11/19): Status post left below-knee amputation. Surgical staples remain. No other fracture or dislocation is noted. Joint spaces are intact.  The patient was febrile the morning of January 11, 2019: Chest x-ray and urinalysis were notable for: CXR (01/11/19): Stable cardiomegaly with central pulmonary vascular congestion. No pneumothorax or pleural effusion is noted. No consolidative process is noted. The visualized skeletal structures are unremarkable. Urinalysis (01/11/19): Positive for  ESCHERICHIA COLI. Started on IV Meropenum. Treated for five days. Treatment stopped: 01/18/19 Blood cultures (01/11/19): Negative  During the weekend of 01/12/19 -01/13/19 patients left BKA stump with increased drainage and erythema along incision. The patient was taken the OR on 01/14/19 and underwent:  Irrigation and debridement of left BKA stump for 30 cm2 to skin and soft tissue, pulse lavage irrigation, and VAC dressing placement. She tolerated the procedure well and was transferred to the surgical floor without issue.   Of note during the patient's inpatient stay, the patient's friend/family member? would take her outside to smoke tobacco even though every effort was made to convince the patient to stop smoking.  During the patient's inpatient stay, her diet was advanced, her Foley was removed without complication her pain was controlled to the use of PO pain medication.  The patient received physical therapy and occupational oherapy however has made minimal progress with ambulation.  Upon discharge, the patient's vital signs are stable, she was afebrile, the patient was tolerating a regular diet and her pain was controlled with p.o. pain medication.  The patient was urinating / having bowel movement / passing flatus without issue.  Her left below the knee amputation site was healing well.  Physical exam:  A&Ox3, NAD CV: RRR Pulmonary: CTA Bilaterally Abdomen: Soft, Nontender, Nondistended, (+) Bowel Sounds Groins: Access sites healed. No signs of infection, drainage or ecchymosis Vascular: Left Lower Extremity: OR / VAC dressing taken down - lateral aspect of incision is open with about 40% fibrinous exudate. Granulation tissue noted. No discharge or foul odor. Surrounding skin healthy.  The patient's left lower extremity has no bruises or abrasions on examination.  The thigh is soft. The left knee is flexible at the joint without bruising. There is no bruising or ecchymosis to the left BKA  stump. The extremity is warm.   Right Lower Extremity: There is no bruising or abrasion to the right lower extremity on exam today. The right thigh and calf are soft.  Of note: I did not see any abrasions, ecchymosis or skin disruptions on the patient's face, arms, chest, abdomen or legs during my examination today  Post-op wounds clean, dry, intact or healing well  Labs as below  Complications: None  Consults:  Diabetes Nutrition Internal Medicine Physical Therapy Occupational Therapy  Significant Diagnostic Studies: CBC Lab Results  Component Value Date   WBC 9.6 01/18/2019   HGB 8.0 (L) 01/18/2019   HCT 27.0 (L) 01/18/2019   MCV 86.3 01/18/2019   PLT 429 (H) 01/18/2019   BMET    Component Value Date/Time   NA 136 01/18/2019 0527   K 4.0 01/18/2019 0527   CL 99 01/18/2019 0527   CO2 31 01/18/2019 0527   GLUCOSE 138 (H) 01/18/2019 0527   BUN 16 01/18/2019 0527   CREATININE 0.60 01/18/2019 0527   CALCIUM 8.6 (L) 01/18/2019 0527   GFRNONAA >60 01/18/2019 0527   GFRAA >60 01/18/2019 0527   COAG Lab Results  Component Value Date   INR 1.41 01/11/2019   INR 1.01 01/07/2019   INR 1.13 01/04/2019   Disposition:  Discharge to :Rehab  Allergies as of 01/18/2019      Reactions   Biaxin [clarithromycin] Rash   Patient states this medication gives her severe rash and thrush in the mouth.      Medication List    STOP taking these medications   clopidogrel 75 MG tablet Commonly known as:  PLAVIX   insulin lispro 100 UNIT/ML injection Commonly known as:  HUMALOG   NOVOLOG FLEXPEN 100 UNIT/ML FlexPen Generic drug:  insulin aspart Replaced  by:  insulin aspart 100 UNIT/ML injection   oxyCODONE-acetaminophen 10-325 MG tablet Commonly known as:  PERCOCET     TAKE these medications   acetaminophen 325 MG tablet Commonly known as:  TYLENOL Take 2 tablets (650 mg total) by mouth every 6 (six) hours as needed for mild pain (or Fever >/= 101).   acetaminophen  650 MG suppository Commonly known as:  TYLENOL Place 1 suppository (650 mg total) rectally every 6 (six) hours as needed for mild pain (or Fever >/= 101).   apixaban 5 MG Tabs tablet Commonly known as:  ELIQUIS Take 1 tablet (5 mg total) by mouth 2 (two) times daily.   aspirin EC 81 MG tablet Take 1 tablet (81 mg total) by mouth daily.   atorvastatin 20 MG tablet Commonly known as:  LIPITOR Take 20 mg by mouth at bedtime.   bisacodyl 5 MG EC tablet Commonly known as:  DULCOLAX Take 1 tablet (5 mg total) by mouth daily as needed for moderate constipation.   CALCIUM 600-D 600-400 MG-UNIT Tabs Generic drug:  Calcium Carbonate-Vitamin D3 Take by mouth 2 (two) times daily.   cholecalciferol 1000 units tablet Commonly known as:  VITAMIN D Take 5,000 Units by mouth daily.   clonazePAM 0.25 MG disintegrating tablet Commonly known as:  KLONOPIN Take 1 tablet (0.25 mg total) by mouth 2 (two) times daily as needed (Anxiety).   DULoxetine 20 MG capsule Commonly known as:  CYMBALTA Take 60 mg by mouth daily.   ENSURE MAX PROTEIN Liqd Take 330 mLs (11 oz total) by mouth 2 (two) times daily.   fluticasone 50 MCG/ACT nasal spray Commonly known as:  FLONASE   furosemide 20 MG tablet Commonly known as:  LASIX Take 1 tablet (20 mg total) by mouth daily.   gabapentin 600 MG tablet Commonly known as:  NEURONTIN Take 2 tablets (1,200 mg total) by mouth 3 (three) times daily.   insulin aspart 100 UNIT/ML injection Commonly known as:  novoLOG Inject 0-20 Units into the skin 3 (three) times daily with meals. Replaces:  NOVOLOG FLEXPEN 100 UNIT/ML FlexPen   insulin aspart 100 UNIT/ML injection Commonly known as:  novoLOG Inject 0-5 Units into the skin at bedtime.   insulin aspart 100 UNIT/ML injection Commonly known as:  novoLOG Inject 4 Units into the skin 3 (three) times daily with meals.   insulin glargine 100 UNIT/ML injection Commonly known as:  LANTUS Inject 0.35 mLs (35  Units total) into the skin daily. What changed:  how much to take   ipratropium-albuterol 0.5-2.5 (3) MG/3ML Soln Commonly known as:  DUONEB Take 3 mLs by nebulization every 4 (four) hours as needed.   latanoprost 0.005 % ophthalmic solution Commonly known as:  XALATAN Place 1 drop into both eyes at bedtime.   lisinopril 2.5 MG tablet Commonly known as:  PRINIVIL,ZESTRIL Take 1 tablet (2.5 mg total) by mouth daily.   loperamide 2 MG capsule Commonly known as:  IMODIUM Take 1 capsule (2 mg total) by mouth every 6 (six) hours as needed for diarrhea or loose stools.   metFORMIN 500 MG 24 hr tablet Commonly known as:  GLUCOPHAGE-XR Take 1 tablet by mouth 2 (two) times daily.   metoprolol tartrate 25 MG tablet Commonly known as:  LOPRESSOR Take 1 tablet (25 mg total) by mouth 2 (two) times daily.   multivitamin-lutein Caps capsule Take 1 capsule by mouth daily.   nicotine 14 mg/24hr patch Commonly known as:  NICODERM CQ - dosed in mg/24 hours  Place 1 patch (14 mg total) onto the skin daily.   nicotine polacrilex 4 MG gum Commonly known as:  NICORETTE Take 1 each (4 mg total) by mouth as needed for smoking cessation.   nortriptyline 50 MG capsule Commonly known as:  PAMELOR Take 1 capsule (50 mg total) by mouth at bedtime. What changed:    how much to take  how to take this  when to take this   ondansetron 4 MG/2ML Soln injection Commonly known as:  ZOFRAN Inject 2 mLs (4 mg total) into the vein every 6 (six) hours as needed for nausea or vomiting.   oxybutynin 10 MG 24 hr tablet Commonly known as:  DITROPAN-XL Take 10 mg by mouth at bedtime.   oxyCODONE 5 MG immediate release tablet Commonly known as:  Oxy IR/ROXICODONE Take 1-2 tablets (5-10 mg total) by mouth every 4 (four) hours as needed for moderate pain.   pantoprazole 20 MG tablet Commonly known as:  PROTONIX Take 20 mg by mouth daily.   PROAIR HFA 108 (90 Base) MCG/ACT inhaler Generic drug:   albuterol   senna 8.6 MG Tabs tablet Commonly known as:  SENOKOT Take 1 tablet (8.6 mg total) by mouth daily as needed for mild constipation.   sodium chloride 1 g tablet Take 2 tablets (2 g total) by mouth 2 (two) times daily with a meal.   traZODone 50 MG tablet Commonly known as:  DESYREL Take 1 tablet (50 mg total) by mouth at bedtime.   vitamin C 1000 MG tablet Take 1,000 mg by mouth daily.      Verbal and written Discharge instructions given to the patient. Wound care per Discharge AVS Follow-up Information    Georgiana Spinner, NP Follow up in 2 week(s).   Specialty:  Vascular Surgery Why:  First post-operative follow up. BKA wound check. Patient has VAC.  Contact information: [address] [place] [address] [phone]        Dortha Kern, MD Follow up in 1 week(s).   Specialty:  Family Medicine Contact information: [address] [place] [address] [phone]          Over thirty minutes were spent planning this discharge.  SignedTonette Lederer, PA-C  01/18/2019, 8:46 AM

## 2019-01-11 NOTE — Progress Notes (Signed)
OT Cancellation Note  Patient Details Name: [name] MRN: [id number] DOB: 07/22/1961   Cancelled Treatment:    Reason Eval/Treat Not Completed: Medical issues which prohibited therapy. Per chart review, pt noted with fever of 102, HR 122, and receiving blood transfusion (Hgb this am 7.0). Will re-attempt OT treatment at later date/time as pt is more medically appropriate.  Richrd Prime, MPH, MS, OTR/L ascom [phone] 01/11/19, 4:44 PM

## 2019-01-11 NOTE — Progress Notes (Signed)
Physical Therapy Treatment Patient Details Name: [name] L Coppedge MRN: [id number] DOB: [birth year] Today's Date: 01/11/2019    History of Present Illness Pt is a 58 yo female with PMHx including PVD, anxiety, GERD, DM, HTN, CRF s/p extensive LLE revascularization during this hospitalization. Angiogram shows minimal flow through the calf even after angioplasty and thrombectomy. Concern for compartment syndrome and pt now s/p LLE fasciotomies (01/01/2019).  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    PT Comments    Session limited due to dizziness in standing (6/10) that worsened with return to sitting (8/10).  HR remained in the low 100s and SpO2 in the mid to high 90s throughout session. BP stable once taken in supine.  Pt continues to require assist for bed mobility and transfers.  Follow up recommendations remain appropriate.    Follow Up Recommendations  SNF     Equipment Recommendations  Other (comment)(TBD at next venue of care, will likely need WC)    Recommendations for Other Services       Precautions / Restrictions Precautions Precautions: Fall;Other (comment) Precaution Comments: R femoral central line Restrictions Weight Bearing Restrictions: Yes LLE Weight Bearing: Non weight bearing    Mobility  Bed Mobility Overal bed mobility: Needs Assistance Bed Mobility: Supine to Sit;Sit to Supine     Supine to sit: HOB elevated;Min guard Sit to supine: Min guard;HOB elevated   General bed mobility comments: Increased time and effort with HOB elevated. Pt demonstrates fatigue with bed mobility.   Transfers Overall transfer level: Needs assistance Equipment used: Rolling walker (2 wheeled) Transfers: Sit to/from Stand Sit to Stand: Min assist;+2 physical assistance;From elevated surface         General transfer comment: Cues for proper hand placement and R foot placement.  Bed elevated.  Assist to rise and for controlled descent to sit. Pt reported 6/10 dizziness in standing at bedside and  was instructed to sit.  HR remains in low 100s throughout session.  SpO2 in mid-high 90s.  BP taken in supine which was stable.   Ambulation/Gait             General Gait Details: Not safe to attempt at this time.   Stairs             Wheelchair Mobility    Modified Rankin (Stroke Patients Only)       Balance Overall balance assessment: Needs assistance Sitting-balance support: No upper extremity supported;Feet supported Sitting balance-Leahy Scale: Good     Standing balance support: Bilateral upper extremity supported;During functional activity Standing balance-Leahy Scale: Poor Standing balance comment: Pt relies on BUE support for static and dynamic activity                            Cognition Arousal/Alertness: Awake/alert Behavior During Therapy: WFL for tasks assessed/performed Overall Cognitive Status: Within Functional Limits for tasks assessed                                        Exercises      General Comments General comments (skin integrity, edema, etc.): Dizziness worsened from 6/10 when standing at bedside to 8/10 sitting EOB, thus had pt return to supine.  Dizziness down to 5/10 in supine at end of session.  Instructed pt to let RN know should dizziness not continue improving.       Pertinent Vitals/Pain  Pain Assessment: Faces Faces Pain Scale: Hurts even more Pain Location: distal LLE Pain Descriptors / Indicators: Grimacing;Guarding Pain Intervention(s): Limited activity within patient's tolerance;Monitored during session;Utilized relaxation techniques    Home Living                      Prior Function            PT Goals (current goals can now be found in the care plan section) Acute Rehab PT Goals Patient Stated Goal: to improve independence PT Goal Formulation: With patient Time For Goal Achievement: 01/22/19 Potential to Achieve Goals: Good    Frequency    7X/week      PT Plan  Current plan remains appropriate    Co-evaluation              AM-PAC PT "6 Clicks" Mobility   Outcome Measure  Help needed turning from your back to your side while in a flat bed without using bedrails?: A Little Help needed moving from lying on your back to sitting on the side of a flat bed without using bedrails?: A Little Help needed moving to and from a bed to a chair (including a wheelchair)?: A Little Help needed standing up from a chair using your arms (e.g., wheelchair or bedside chair)?: A Lot Help needed to walk in hospital room?: Total Help needed climbing 3-5 steps with a railing? : Total 6 Click Score: 13    End of Session Equipment Utilized During Treatment: Gait belt Activity Tolerance: Treatment limited secondary to medical complications (Comment)(limited by dizziness) Patient left: with call bell/phone within reach;in bed;with bed alarm set;with SCD's reapplied Nurse Communication: Mobility status PT Visit Diagnosis: Pain;Unsteadiness on feet (R26.81);Other abnormalities of gait and mobility (R26.89);Difficulty in walking, not elsewhere classified (R26.2);Muscle weakness (generalized) (M62.81) Pain - Right/Left: Left Pain - part of body: Leg     Time: 6060-0459 PT Time Calculation (min) (ACUTE ONLY): 33 min  Charges:  $Therapeutic Activity: 23-37 mins                     Encarnacion Chu PT, DPT 01/11/2019, 1:59 PM

## 2019-01-11 NOTE — Progress Notes (Signed)
Pt HR in the 130's/140's, Temp. 102.6 this AM. MD notified and order placed for Metoprolol 5 mg x one, Tylenol given for temp.

## 2019-01-11 NOTE — Progress Notes (Addendum)
Notified Dr. Imogene Burn pt was running a fever of 102. Per MD hold blood transfusion until pt is not longer running a fever. RN gave PO tylenol to pt. Will continue to assess and monitor.

## 2019-01-11 NOTE — Clinical Social Work Note (Signed)
CSW spoke with Lorrin Mais at [facility]) regarding patient. CSW informed Claiborne Billings that patient chose [facility] for facility. Claiborne Billings states that patient has been approved to go to [facility] and Josem Kaufmann number is [phone]. Claiborne Billings states that she will contact [facility] with authorization details. CSW also called Claiborne Billings at [facility] and notified her of bed acceptance and authorization approval.   CSW met with patient and her mother Domonic Hiscox at bedside. CSW informed them that patient's insurance has approved her for a rehab stay at [facility]. Mother thanked CSW and asked about POA paperwork. CSW explained that the POA paperwork is done by the chaplain but CSW would notify them to come and meet with her. CSW spoke with RN about mother's request and he has already called the chaplain to assist patient and mother with POA paperwork.    [place], [place]

## 2019-01-11 NOTE — Clinical Social Work Note (Signed)
CSW contacted patient's mother Lenard GallowayJudy Eich [phone] to inquire about facility choice. CSW introduced self and before she could finish, mother interrupted saying "I know who you are and you need to call my daughter's room or cell phone to get the information you want." CSW thanked mother and states that she will speak with patient regarding bed offers. CSW and RNCM went to patient's room to speak with her but patient was off the floor for an ultrasound. CSW planned to come back and speak with patient later today. Before CSW could meet with patient, her mother contacted CSW again. Mother was very pleasant and states that she did not know patient was off the floor earlier today. [facility] Mother reports that the facilities they originally wanted, are not available and [facility] have [facility]. CSW thanked patient's mother and explained that the insurance company would be updated and she will be notified when patient is ready for discharge. CSW will contact [employer] to update facility choice and notify [facility] of bed acceptance.   Ruthe Mannanandace Mohamadou Maciver MSW, [address]CSWA [phone]

## 2019-01-11 NOTE — Progress Notes (Addendum)
[facility] Daily Progress Note   Subjective: 11Days Post-Op:Ultrasound guidance for vascular accessrightfemoral artery,Catheter placement into left common femoral artery from right femoral approach, Selectiveleftlower extremity angiogram,Placement of 8 mg of TPA with an infusion catheter to the left SFA and popliteal arteries,Placement of infusion catheter in left SFA, popliteal artery, and proximal posterior tibial artery for continuous infusion of TPA overnight,Placement of a right femoral venous triple-lumen catheter with ultrasound and fluoroscopy guidance  10Days Post-Op:Angiogram through existing catheter left lower extremity,Catheter placement into left posterior tibial artery from right femoral approach,Mechanical thrombectomy to the left SFA, popliteal artery, tibioperoneal trunk, and posterior tibial arteries with the penumbra cat 6 device,Percutaneous transluminal angioplasty ofleft posterior tibial artery and tibioperoneal trunk with 3 mm diameter by 30 cm length angioplasty balloonwithStarClose closure device rightfemoral artery  10Days Post-Op:Left leg / calf fasciotomies  4Days Post-Op: Left below-the-knee amputation   Had a group meeting consisting of social work, the patient, her mother, PA student and I in regard to recent events and discharge planning this AM.  We had a discussion about the patient's fall yesterday.  Both the patient and the mother feel that she was "dropped".  The patient's mother is threatening to call a Clinical research associate.  The patient is complaining of some increased left lower extremity pain.  The patient and her mother have been offered 3 places for rehab.  The patient and her mother feel that these rehab places have "poor physical therapy".  The patient's mother states that she has had 2 heart attacks in the last month and she "knows what good physical therapy" consist of.  The patient's mother states she has a "friend" that  works at 1 of the rehabs that she would like to be discharged to.  Her mother has placed a call to her friend and is seeing if her friend can "pull some strings".  As a group, we tried to explain to the patient and her mother that her insurance will only pay for certain rehabs that her insurance approves.  The patient and her mother do not seem to completely understand this.  The patient and her mother also do not understand that once the patient becomes medically stable her insurance company will not continue to pay for inpatient services while they continue to look for a rehab they prefer.  It was explained to them that once the patient is medically stable her insurance would stop paying and she may be responsible for her hospital bill.  OR dressing taken down.  The patient's left lower extremity has no bruises or abrasions on examination.  The thigh is soft.  The left knee is flexible at the joint without bruising.  The stump is soft and healthy.  Staples and sutures are clean dry and intact.  Minimal bloody drainage. The stump looks very healthy.  There is no bruising or ecchymosis to the left BKA stump.  The extremity is warm.  There is no bruising or abrasion to the right lower extremity on exam today.  The right thigh and calf are soft.  Of note, the patient continues to be taken outside by her mother to smoke tobacco.  The patient continues to complain of pain to the left lower extremity however during our conversation the patient would fall asleep at times.  Patient was febrile last night to 102.6.  Objective: Vitals:   01/11/19 0340 01/11/19 0344 01/11/19 0657 01/11/19 0806  BP: 126/74   113/62  Pulse: (!) 125 (!) 115  Marland Kitchen)  108  Resp:      Temp:  (!) 102.6 F (39.2 C) 98.4 F (36.9 C)   TempSrc:  Oral Oral   SpO2: 92% 93%    Weight: 121.9 kg     Height:        Intake/Output Summary (Last 24 hours) at 01/11/2019 1130 Last data filed at 01/11/2019 0900 Gross per 24 hour  Intake 118 ml   Output 1000 ml  Net -882 ml   Physical Exam: A&Ox3, NAD CV: RRR Pulmonary: CTA Bilaterally Abdomen: Soft, Nontender, Nondistended, (+) Bowel Sounds Groins: Access sites healed. No signs of infection, drainage or ecchymosis Vascular: Left Lower Extremity: OR dressing taken down.  The patient's left lower extremity has no bruises or abrasions on examination.  The thigh is soft.  The left knee is flexible at the joint without bruising.  The stump is soft and healthy.  Staples and sutures are clean dry and intact.  Minimal bloody drainage. The stump looks very healthy.  There is no bruising or ecchymosis to the left BKA stump.  The extremity is warm.   Right Lower Extremity: There is no bruising or abrasion to the right lower extremity on exam today. The right thigh and calf are soft.  Of note: I did not see any abrasions, ecchymosis or skin disruptions on the patient's face, arms, chest, abdomen or legs during my examination today.  Laboratory: CBC    Component Value Date/Time   WBC 14.0 (H) 01/11/2019 0747   HGB 7.0 (L) 01/11/2019 0747   HCT 23.6 (L) 01/11/2019 0747   PLT 255 01/11/2019 0747   BMET    Component Value Date/Time   NA 134 (L) 01/11/2019 0747   K 4.5 01/11/2019 0747   CL 100 01/11/2019 0747   CO2 28 01/11/2019 0747   GLUCOSE 152 (H) 01/11/2019 0747   BUN 18 01/11/2019 0747   CREATININE 0.85 01/11/2019 0747   CALCIUM 8.5 (L) 01/11/2019 0747   GFRNONAA >60 01/11/2019 0747   GFRAA >60 01/11/2019 0747   Assessment/Planning: The patient is a 58 year old female with multiple medical issues continued ongoing tobacco abuse with a known history of severe peripheral artery disease to the left lower extremity status post endovascular intervention and left below the knee amputation 1) had a multidisciplinary meeting this a.m. with the patient and her mother please see above.  At this time, the patient's mother is still trying to get in touch with her friend who works at the  rehab that she would like her daughter to be discharged to.  She agreed to give us an decision by approximately 1230 / 1:00 today 2) patient accidentally fell yesterday while transitioning from the bed to the commode.  I reinforced the importance of working with physical therapy and the need for rehab which can provide more intensive physical therapy.  I will x-ray the patient's left lower extremity from the hip to the stump to assure there is no fracture.  Otherwise there is no gross injury to the left lower extremity. 3) patient is febrile this morning to 102.  Will order a chest x-ray, urine culture, urine analysis and a.m. labs tomorrow - most likely atelectasis as the patient has continued to smoke on a daily basis while an inpatient and has not been out of bed sitting in chair or ambulating. The patient is not using an IS. Strongly encouraged the patient to use IS every hour. Explained atelectasis and how it can cause fever and pneumonia.  4) daily  Kerlex dressing changes to the left below the knee amputation stump or as needed if there is drainage  Discussed with Dr. Wallis Mart Irelynn Schermerhorn PA-C 01/11/2019 11:30 AM

## 2019-01-11 NOTE — Progress Notes (Signed)
Dr Nancy MarusMayo notified of troponin 0.03, , acknowledged, orders in place for troponin to be drawn at 0300

## 2019-01-11 NOTE — Progress Notes (Signed)
Pharmacy Antibiotic Note  [name] is a 58 y.o. female admitted on 12/31/2018 with UTI.  Pharmacy has been consulted for cefepime 2 gm IV q12h dosing.  Plan: Cefepime 2 gm IV Q12H. Consider narrower or broader empiric therapy. Patient has a history of ESBL E. Coli UTI but it was resistant to cefepime, too, and that was 2017.   Height: 5\' 3"  (160 cm) Weight: 268 lb 11.9 oz (121.9 kg) IBW/kg (Calculated) : 52.4  Temp (24hrs), Avg:100.1 F (37.8 C), Min:98.4 F (36.9 C), Max:102.6 F (39.2 C)  Recent Labs  Lab 01/06/19 0433 01/07/19 0426 01/08/19 0814 01/09/19 0354 01/11/19 0747  WBC 11.7* 11.2* 17.5* 11.3* 14.0*  CREATININE 0.75 0.67 0.72 0.87 0.85    Estimated Creatinine Clearance: 93.6 mL/min (by C-G formula based on SCr of 0.85 mg/dL).    Allergies  Allergen Reactions  . Biaxin [Clarithromycin] Rash    Patient states this medication gives her severe rash and thrush in the mouth.    Antimicrobials this admission:   Dose adjustments this admission:   Microbiology results:  BCx:   UCx:    Sputum:    MRSA PCR:   Thank you for allowing pharmacy to be a part of this patient's care.  Carola Frost, PharmD, BCPS Clinical Pharmacist 01/11/2019 6:26 PM

## 2019-01-11 NOTE — Consult Note (Signed)
PHARMACY CONSULT NOTE - FOLLOW UP  Pharmacy Consult for Electrolyte Monitoring and Replacement   Recent Labs: Potassium (mmol/L)  Date Value  01/11/2019 4.5   Magnesium (mg/dL)  Date Value  79/39/0300 1.3 (L)   Calcium (mg/dL)  Date Value  92/33/0076 8.5 (L)   Albumin (g/dL)  Date Value  22/63/3354 1.2 (L)   Phosphorus (mg/dL)  Date Value  56/25/6389 3.4   Sodium (mmol/L)  Date Value  01/11/2019 134 (L)   Correct Calcium: 10.8  Assessment: Pharmacy consulted for electrolyte monitoring and replacement in 58 yo female admitted with LLE pain and ulceration. Patient had hypomagnesemia and hypokalemia on admission.   Goal of Therapy:  Electrolytes WNL   Plan:  K 4.5 Mag 1.3  Scr 0.85  Patient on lasix 20 mg PO daily. Will order Magnesium 2 gram IV x1  Will F/U up with AM labs and continue to replace as needed.   Angelique Blonder, PharmD Clinical Pharmacist 01/11/2019 9:29 AM

## 2019-01-12 LAB — GLUCOSE, CAPILLARY
Glucose-Capillary: 131 mg/dL — ABNORMAL HIGH (ref 70–99)
Glucose-Capillary: 84 mg/dL (ref 70–99)
Glucose-Capillary: 225 mg/dL — ABNORMAL HIGH (ref 70–99)
Glucose-Capillary: 174 mg/dL — ABNORMAL HIGH (ref 70–99)

## 2019-01-12 LAB — CBC
HEMATOCRIT: 26.2 % — AB (ref 36.0–46.0)
Hemoglobin: 8 g/dL — ABNORMAL LOW (ref 12.0–15.0)
MCH: 26.3 pg (ref 26.0–34.0)
MCHC: 30.5 g/dL (ref 30.0–36.0)
MCV: 86.2 fL (ref 80.0–100.0)
Platelets: 270 10*3/uL (ref 150–400)
RBC: 3.04 MIL/uL — ABNORMAL LOW (ref 3.87–5.11)
RDW: 18.7 % — ABNORMAL HIGH (ref 11.5–15.5)
WBC: 10.3 10*3/uL (ref 4.0–10.5)
nRBC: 0 % (ref 0.0–0.2)

## 2019-01-12 LAB — MAGNESIUM: Magnesium: 2.1 mg/dL (ref 1.7–2.4)

## 2019-01-12 LAB — BASIC METABOLIC PANEL
Anion gap: 7 (ref 5–15)
BUN: 16 mg/dL (ref 6–20)
CO2: 27 mmol/L (ref 22–32)
Calcium: 8.3 mg/dL — ABNORMAL LOW (ref 8.9–10.3)
Chloride: 98 mmol/L (ref 98–111)
Creatinine, Ser: 0.77 mg/dL (ref 0.44–1.00)
GFR calc non Af Amer: >60 mL/min (ref >60)
Glucose, Bld: 161 mg/dL — ABNORMAL HIGH (ref 70–99)
POTASSIUM: 4.4 mmol/L (ref 3.5–5.1)
Sodium: 132 mmol/L — ABNORMAL LOW (ref 135–145)

## 2019-01-12 LAB — BPAM RBC
Blood Product Expiration Date: 202002062359
ISSUE DATE / TIME: 202001171826
Unit Type and Rh: 6200

## 2019-01-12 LAB — TYPE AND SCREEN
ABO/RH(D): A POS
Antibody Screen: NEGATIVE
Unit division: 0

## 2019-01-12 LAB — TROPONIN I: Troponin I: 0.03 ng/mL (ref <0.03)

## 2019-01-12 LAB — ABO/RH: ABO/RH(D): A POS

## 2019-01-12 MED ORDER — SODIUM CHLORIDE 0.9 % IV SOLN
1.0000 g | INTRAVENOUS | Status: DC
  Administered 2019-01-12: 1 g via INTRAVENOUS
  Filled 2019-01-12: qty 10
  Filled 2019-01-12: qty 1

## 2019-01-12 MED ORDER — KETOROLAC TROMETHAMINE 30 MG/ML IJ SOLN
30.0000 mg | Freq: Three times a day (TID) | INTRAMUSCULAR | Status: DC | PRN
  Administered 2019-01-12 – 2019-01-14 (×5): 30 mg via INTRAVENOUS
  Filled 2019-01-12 (×5): qty 1

## 2019-01-12 MED ORDER — KETOROLAC TROMETHAMINE 30 MG/ML IJ SOLN
INTRAMUSCULAR | Status: AC
  Administered 2019-01-12: 30 mg
  Filled 2019-01-12: qty 1

## 2019-01-12 MED ORDER — KETOROLAC TROMETHAMINE 30 MG/ML IJ SOLN: 30.0000 mg | Freq: Once | INTRAMUSCULAR | Status: DC

## 2019-01-12 MED ORDER — IPRATROPIUM-ALBUTEROL 0.5-2.5 (3) MG/3ML IN SOLN
3.0000 mL | Freq: Two times a day (BID) | RESPIRATORY_TRACT | Status: DC
  Administered 2019-01-13: 3 mL via RESPIRATORY_TRACT
  Filled 2019-01-12 (×2): qty 3

## 2019-01-12 NOTE — Progress Notes (Signed)
Physical Therapy Treatment Patient Details Name: [name] MRN: [id number] DOB: November 15, 1961 Today's Date: 01/12/2019    History of Present Illness Pt is a 58 yo female with PMHx including PVD, anxiety, GERD, DM, HTN, CRF s/p extensive LLE revascularization during this hospitalization. Angiogram shows minimal flow through the calf even after angioplasty and thrombectomy. Concern for compartment syndrome and pt now s/p LLE fasciotomies (01/01/2019).  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    PT Comments    Returned for PT treatment after pt requested pain meds. Pt reports minimal change in pain upon return, but she is agreeable to PT session. HR remains tachy upper 110s to 121bpm. D/t HR and high pain levels,bed mobility deferred at this time. Continued with education and assistance with exercise program, education on importance of daily position to avoid hip flexion contracture. Pt limited heavily by pain, ut remains motivated to 'try [her] best.' Will continue to follow acutely.    Follow Up Recommendations  SNF     Equipment Recommendations       Recommendations for Other Services       Precautions / Restrictions Precautions Precautions: Fall Restrictions RLE Weight Bearing: Weight bearing as tolerated LLE Weight Bearing: Non weight bearing Other Position/Activity Restrictions: s/p Lt BKA    Mobility  Bed Mobility Overal bed mobility: (deferred 2/2 HR)                Transfers                    Ambulation/Gait                 Stairs             Wheelchair Mobility    Modified Rankin (Stroke Patients Only)       Balance                                            Cognition Arousal/Alertness: Awake/alert Behavior During Therapy: WFL for tasks assessed/performed Overall Cognitive Status: Within Functional Limits for tasks assessed                                 General Comments: tearful at times, but motivated and  focused.       Exercises Amputee Exercises Gluteal Sets: Left;AROM;Strengthening;Supine;10 reps;Limitations Gluteal Sets Limitations: 3secH Hip ABduction/ADduction: AAROM;Left;10 reps;Supine Knee Extension: AROM;Strengthening;Left;10 reps Straight Leg Raises: AAROM;Supine;Left;10 reps    General Comments        Pertinent Vitals/Pain Pain Assessment: 0-10 Pain Score: 8  Pain Location: Lt residual limb Pain Descriptors / Indicators: Grimacing;Guarding Pain Intervention(s): Limited activity within patient's tolerance;Monitored during session;Premedicated before session;Repositioned    Home Living                      Prior Function            PT Goals (current goals can now be found in the care plan section) Acute Rehab PT Goals Patient Stated Goal: to improve independence PT Goal Formulation: With patient Time For Goal Achievement: 01/22/19 Potential to Achieve Goals: Good Progress towards PT goals: PT to reassess next treatment    Frequency    7X/week      PT Plan Current plan remains appropriate    Co-evaluation  AM-PAC PT "6 Clicks" Mobility   Outcome Measure  Help needed turning from your back to your side while in a flat bed without using bedrails?: A Little Help needed moving from lying on your back to sitting on the side of a flat bed without using bedrails?: A Little Help needed moving to and from a bed to a chair (including a wheelchair)?: A Lot Help needed standing up from a chair using your arms (e.g., wheelchair or bedside chair)?: A Lot Help needed to walk in hospital room?: Total Help needed climbing 3-5 steps with a railing? : Total 6 Click Score: 12    End of Session   Activity Tolerance: Patient tolerated treatment well;Patient limited by pain Patient left: with call bell/phone within reach;in bed;with bed alarm set;with SCD's reapplied Nurse Communication: Mobility status PT Visit Diagnosis: Pain;Unsteadiness  on feet (R26.81);Other abnormalities of gait and mobility (R26.89);Difficulty in walking, not elsewhere classified (R26.2);Muscle weakness (generalized) (M62.81) Pain - Right/Left: Left Pain - part of body: Leg     Time: 1540-1606 PT Time Calculation (min) (ACUTE ONLY): 26 min  Charges:  $Therapeutic Exercise: 23-37 mins                     4:18 PM, 01/12/19 Rosamaria Lints, PT, DPT Physical Therapist - [facility]  [phone] (ASCOM)     , C 01/12/2019, 4:16 PM

## 2019-01-12 NOTE — Progress Notes (Signed)
Dr Allena Katz notified increase in temp 101.8, acknowledged, stated to give tylenol and let day shift know about temp.

## 2019-01-12 NOTE — Consult Note (Signed)
PHARMACY CONSULT NOTE - FOLLOW UP  Pharmacy Consult for Electrolyte Monitoring and Replacement   Recent Labs: Potassium (mmol/L)  Date Value  01/12/2019 4.4   Magnesium (mg/dL)  Date Value  16/57/9038 2.1   Calcium (mg/dL)  Date Value  33/38/3291 8.3 (L)   Albumin (g/dL)  Date Value  91/66/0600 1.2 (L)   Phosphorus (mg/dL)  Date Value  45/99/7741 3.4   Sodium (mmol/L)  Date Value  01/12/2019 132 (L)   Correct Calcium: 10.8  Assessment: Pharmacy consulted for electrolyte monitoring and replacement in 58 yo female admitted with LLE pain and ulceration. Patient had hypomagnesemia and hypokalemia on admission.   Goal of Therapy:  Electrolytes WNL   Plan:  K 4.4 Mag 2.1  Scr 0.77  Patient on lasix 20 mg PO daily. No supplementation at this time. Will F/U up with AM labs and continue to replace as needed.   Angelique Blonder, PharmD Clinical Pharmacist 01/12/2019 8:36 AM

## 2019-01-12 NOTE — Progress Notes (Addendum)
5 Days Post-Op   Subjective/Chief Complaint: Doing OK. States pain controlled.  Noted Febrile again overnight. + UTI   Objective: Vital signs in last 24 hours: Temp:  [98.3 F (36.8 C)-102 F (38.9 C)] 101.8 F (38.8 C) (01/18 0524) Pulse Rate:  [110-122] 118 (01/18 0524) Resp:  [19-24] 20 (01/18 0524) BP: (80-143)/(50-75) 121/62 (01/18 0524) SpO2:  [91 %-96 %] 95 % (01/18 0524) Last BM Date: 01/11/19  Intake/Output from previous day: 01/17 0701 - 01/18 0700 In: 431 [P.O.:90; I.V.:20; Blood:321] Out: 2350 [Urine:2350] Intake/Output this shift: No intake/output data recorded.  General appearance: alert and no distress GI: soft, non-tender; bowel sounds normal; no masses,  no organomegaly Extremities: LEFT BKA stump- incision C/D/I, scant serosang drainage, soft, pink, viable, nontender  Lab Results:  Recent Labs    01/11/19 0747 01/12/19 0444  WBC 14.0* 10.3  HGB 7.0* 8.0*  HCT 23.6* 26.2*  PLT 255 270   BMET Recent Labs    01/11/19 0747 01/12/19 0444  NA 134* 132*  K 4.5 4.4  CL 100 98  CO2 28 27  GLUCOSE 152* 161*  BUN 18 16  CREATININE 0.85 0.77  CALCIUM 8.5* 8.3*   PT/INR Recent Labs    01/11/19 2246  LABPROT 17.1*  INR 1.41   ABG No results for input(s): PHART, HCO3 in the last 72 hours.  Invalid input(s): PCO2, PO2  Studies/Results: Dg Chest 2 View  Result Date: 01/11/2019 CLINICAL DATA:  Chest pain after fall. EXAM: CHEST - 2 VIEW COMPARISON:  Radiographs of April 02, 2018. FINDINGS: Stable cardiomegaly with central pulmonary vascular congestion. No pneumothorax or pleural effusion is noted. No consolidative process is noted. The visualized skeletal structures are unremarkable. IMPRESSION: Stable cardiomegaly with central pulmonary vascular congestion. Electronically Signed   By: Lupita RaiderJames  Green Jr, M.D.   On: 01/11/2019 13:05   Dg Knee 1-2 Views Left  Result Date: 01/11/2019 CLINICAL DATA:  Left knee pain after fall. EXAM: LEFT KNEE - 1-2  VIEW COMPARISON:  Radiographs of October 03, 2016. FINDINGS: Status post left below-knee amputation. Surgical staples remain. No other fracture or dislocation is noted. Joint spaces are intact. IMPRESSION: Status post left below-knee amputation. No other fracture or bony abnormality is noted. Electronically Signed   By: Lupita RaiderJames  Green Jr, M.D.   On: 01/11/2019 13:03   Dg Humerus Left  Result Date: 01/11/2019 CLINICAL DATA:  Pt reports that she had an assisted fall where she was helped to the ground by nurse and nurse aide. Pt now complaining of chest pain, left arm pain, knee pain and left hip pain. Pt had recent surgery to her left leg. EXAM: LEFT HUMERUS - 2+ VIEW COMPARISON:  None. FINDINGS: No fracture or bone lesion. Shoulder and elbow joints are normally aligned. Soft tissues are unremarkable. IMPRESSION: Negative. Electronically Signed   By: Amie Portlandavid  Ormond M.D.   On: 01/11/2019 13:01   Dg Hip Unilat With Pelvis 2-3 Views Left  Result Date: 01/11/2019 CLINICAL DATA:  Left hip pain after fall. EXAM: DG HIP (WITH OR WITHOUT PELVIS) 2-3V LEFT COMPARISON:  None. FINDINGS: There is no evidence of hip fracture or dislocation. There is no evidence of arthropathy or other focal bone abnormality. IMPRESSION: Negative. Electronically Signed   By: Lupita RaiderJames  Green Jr, M.D.   On: 01/11/2019 13:02    Anti-infectives: Anti-infectives (From admission, onward)   Start     Dose/Rate Route Frequency Ordered Stop   01/12/19 0800  ceFEPIme (MAXIPIME) 2 g in sodium chloride 0.9 %  100 mL IVPB     2 g 200 mL/hr over 30 Minutes Intravenous Every 12 hours 01/11/19 1826     01/11/19 1745  cefTRIAXone (ROCEPHIN) 1 g in sodium chloride 0.9 % 100 mL IVPB  Status:  Discontinued     1 g 200 mL/hr over 30 Minutes Intravenous Every 24 hours 01/11/19 1736 01/11/19 1741   01/11/19 1745  ceFEPIme (MAXIPIME) 2 g in sodium chloride 0.9 % 100 mL IVPB     2 g 200 mL/hr over 30 Minutes Intravenous  Once 01/11/19 1741 01/11/19 1956    01/07/19 1356  ceFAZolin (ANCEF) 2-4 GM/100ML-% IVPB    Note to Pharmacy:  Lorrene Reid   : cabinet override      01/07/19 1356 01/07/19 1426   01/07/19 0500  ceFAZolin (ANCEF) IVPB 2g/100 mL premix    Note to Pharmacy:  Send with pt to OR.   2 g 200 mL/hr over 30 Minutes Intravenous On call 01/04/19 1307 01/07/19 1456   01/02/19 0000  ceFAZolin (ANCEF) IVPB 2g/100 mL premix    Note to Pharmacy:  Send with pt to OR   2 g 200 mL/hr over 30 Minutes Intravenous On call 01/01/19 1012 01/01/19 1253   01/01/19 0500  ceFAZolin (ANCEF) IVPB 2g/100 mL premix    Note to Pharmacy:  Please send with patient to specials   2 g 200 mL/hr over 30 Minutes Intravenous On call 12/31/18 1708 01/01/19 0800   12/31/18 1600  ceFAZolin (ANCEF) IVPB 2g/100 mL premix  Status:  Discontinued     2 g 200 mL/hr over 30 Minutes Intravenous  Once 12/31/18 1547 12/31/18 1810      Assessment/Plan: s/p Procedure(s): AMPUTATION BELOW KNEE (Left) Continue OOB with PT  ABX for UTI Encourage Incentive spirometry  Will need to remain afebrile for 24 hours prior to discharge.  Probable early next week to rehab. Appreciate Medicine input and care  LOS: 12 days    Eli Hose A 01/12/2019

## 2019-01-12 NOTE — Progress Notes (Signed)
[employer] - [place] at [facility]                                                                                                                                                                                  Patient Demographics   Jamaria Schnake, is a 58 y.o. female, DOB - 10/10/1961, ZOX:[id number]  Admit date - 12/31/2018   Admitting Physician Annice Needy, MD  Outpatient Primary MD for the patient is Dortha Kern, MD   LOS - 12  Subjective: Patient's breathing is now normal   Review of Systems:   CONSTITUTIONAL: No documented fever. No fatigue, weakness. No weight gain, no weight loss.  EYES: No blurry or double vision.  ENT: No tinnitus. No postnasal drip. No redness of the oropharynx.  RESPIRATORY: No cough, no wheeze, no hemoptysis. No dyspnea.  CARDIOVASCULAR: No chest pain. No orthopnea. No palpitations. No syncope.  GASTROINTESTINAL: No nausea, no vomiting or diarrhea. No abdominal pain. No melena or hematochezia.  GENITOURINARY: No dysuria or hematuria.  ENDOCRINE: No polyuria or nocturia. No heat or cold intolerance.  HEMATOLOGY: No anemia. No bruising. No bleeding.  INTEGUMENTARY: No rashes. No lesions.  MUSCULOSKELETAL: No arthritis. No swelling. No gout.  NEUROLOGIC: No numbness, tingling, or ataxia. No seizure-type activity.  PSYCHIATRIC: No anxiety. No insomnia. No ADD.    Vitals:   Vitals:   01/12/19 0524 01/12/19 0952 01/12/19 1116 01/12/19 1243  BP: 121/62  120/63 117/77  Pulse: (!) 118  (!) 104 (!) 102  Resp: 20   16  Temp: (!) 101.8 F (38.8 C) 99.3 F (37.4 C)  99.6 F (37.6 C)  TempSrc: Oral Oral  Oral  SpO2: 95%   95%  Weight:      Height:        Wt Readings from Last 3 Encounters:  01/11/19 121.9 kg  12/17/18 119.7 kg  12/04/18 111.6 kg     Intake/Output Summary (Last 24 hours) at 01/12/2019 1302 Last data filed at 01/12/2019 0500 Gross per 24 hour  Intake 431 ml  Output 2250 ml  Net -1819 ml    Physical Exam:    GENERAL: Pleasant-appearing in no apparent distress.  HEAD, EYES, EARS, NOSE AND THROAT: Atraumatic, normocephalic. Extraocular muscles are intact. Pupils equal and reactive to light. Sclerae anicteric. No conjunctival injection. No oro-pharyngeal erythema.  NECK: Supple. There is no jugular venous distention. No bruits, no lymphadenopathy, no thyromegaly.  HEART: Regular rate and rhythm,. No murmurs, no rubs, no clicks.  LUNGS: Clear to auscultation bilaterally. No rales or rhonchi. No wheezes.  ABDOMEN: Soft, flat, nontender, nondistended. Has good bowel sounds. No hepatosplenomegaly appreciated.  EXTREMITIES: No evidence of  any cyanosis, clubbing, or peripheral edema.  +2 pedal and radial pulses bilaterally.  NEUROLOGIC: The patient is alert, awake, and oriented x3 with no focal motor or sensory deficits appreciated bilaterally.  SKIN: Moist and warm with no rashes appreciated.  Psych: Not anxious, depressed LN: No inguinal LN enlargement    Antibiotics   Anti-infectives (From admission, onward)   Start     Dose/Rate Route Frequency Ordered Stop   01/12/19 1100  cefTRIAXone (ROCEPHIN) 1 g in sodium chloride 0.9 % 100 mL IVPB     1 g 200 mL/hr over 30 Minutes Intravenous Every 24 hours 01/12/19 0944     01/12/19 0800  ceFEPIme (MAXIPIME) 2 g in sodium chloride 0.9 % 100 mL IVPB  Status:  Discontinued     2 g 200 mL/hr over 30 Minutes Intravenous Every 12 hours 01/11/19 1826 01/12/19 0944   01/11/19 1745  cefTRIAXone (ROCEPHIN) 1 g in sodium chloride 0.9 % 100 mL IVPB  Status:  Discontinued     1 g 200 mL/hr over 30 Minutes Intravenous Every 24 hours 01/11/19 1736 01/11/19 1741   01/11/19 1745  ceFEPIme (MAXIPIME) 2 g in sodium chloride 0.9 % 100 mL IVPB     2 g 200 mL/hr over 30 Minutes Intravenous  Once 01/11/19 1741 01/11/19 1956   01/07/19 1356  ceFAZolin (ANCEF) 2-4 GM/100ML-% IVPB    Note to Pharmacy:  Lorrene Reid   : cabinet override      01/07/19 1356 01/07/19 1426    01/07/19 0500  ceFAZolin (ANCEF) IVPB 2g/100 mL premix    Note to Pharmacy:  Send with pt to OR.   2 g 200 mL/hr over 30 Minutes Intravenous On call 01/04/19 1307 01/07/19 1456   01/02/19 0000  ceFAZolin (ANCEF) IVPB 2g/100 mL premix    Note to Pharmacy:  Send with pt to OR   2 g 200 mL/hr over 30 Minutes Intravenous On call 01/01/19 1012 01/01/19 1253   01/01/19 0500  ceFAZolin (ANCEF) IVPB 2g/100 mL premix    Note to Pharmacy:  Please send with patient to specials   2 g 200 mL/hr over 30 Minutes Intravenous On call 12/31/18 1708 01/01/19 0800   12/31/18 1600  ceFAZolin (ANCEF) IVPB 2g/100 mL premix  Status:  Discontinued     2 g 200 mL/hr over 30 Minutes Intravenous  Once 12/31/18 1547 12/31/18 1810      Medications   Scheduled Meds: . apixaban  5 mg Oral BID  . aspirin EC  81 mg Oral Daily  . atorvastatin  20 mg Oral QHS  . furosemide  20 mg Intravenous BID  . gabapentin  1,200 mg Oral TID  . insulin aspart  0-20 Units Subcutaneous TID WC  . insulin aspart  0-5 Units Subcutaneous QHS  . insulin aspart  4 Units Subcutaneous TID WC  . insulin glargine  35 Units Subcutaneous Daily  . ipratropium-albuterol  3 mL Nebulization Q6H  . latanoprost  1 drop Both Eyes QHS  . lisinopril  2.5 mg Oral Daily  . metoprolol tartrate  25 mg Oral BID  . multivitamin-lutein  1 capsule Oral Daily  . nicotine  14 mg Transdermal Daily  . nortriptyline  50 mg Oral QHS  . oxybutynin  10 mg Oral QHS  . pantoprazole  20 mg Oral Daily  . pregabalin  75 mg Oral BID  . ENSURE MAX PROTEIN  11 oz Oral BID  . sodium chloride  2 g Oral BID WC  .  traZODone  50 mg Oral QHS   Continuous Infusions: . sodium chloride 250 mL (01/12/19 1124)  . cefTRIAXone (ROCEPHIN)  IV 1 g (01/12/19 1137)   PRN Meds:.sodium chloride, acetaminophen **OR** acetaminophen, bisacodyl, clonazepam, ibuprofen, ipratropium-albuterol, loperamide, morphine injection, nicotine polacrilex, ondansetron (ZOFRAN) IV, oxyCODONE,  senna   Data Review:   Micro Results Recent Results (from the past 240 hour(s))  CULTURE, BLOOD (ROUTINE X 2) w Reflex to ID Panel     Status: None (Preliminary result)   Collection Time: 01/11/19  6:04 PM  Result Value Ref Range Status   Specimen Description BLOOD LEFTARM  Final   Special Requests   Final    BOTTLES DRAWN AEROBIC AND ANAEROBIC Blood Culture adequate volume   Culture   Final    NO GROWTH < 12 HOURS Performed at [facility], [address] [place], [place] [address]    Report Status PENDING  Incomplete  CULTURE, BLOOD (ROUTINE X 2) w Reflex to ID Panel     Status: None (Preliminary result)   Collection Time: 01/11/19  6:18 PM  Result Value Ref Range Status   Specimen Description BLOOD RAC  Final   Special Requests   Final    BOTTLES DRAWN AEROBIC AND ANAEROBIC Blood Culture adequate volume   Culture   Final    NO GROWTH < 12 HOURS Performed at [facility], [address] [place], [place] [address]    Report Status PENDING  Incomplete    Radiology Reports Dg Chest 2 View  Result Date: 01/11/2019 CLINICAL DATA:  Chest pain after fall. EXAM: CHEST - 2 VIEW COMPARISON:  Radiographs of April 02, 2018. FINDINGS: Stable cardiomegaly with central pulmonary vascular congestion. No pneumothorax or pleural effusion is noted. No consolidative process is noted. The visualized skeletal structures are unremarkable. IMPRESSION: Stable cardiomegaly with central pulmonary vascular congestion. Electronically Signed   By: Lupita Raider, M.D.   On: 01/11/2019 13:05   Dg Knee 1-2 Views Left  Result Date: 01/11/2019 CLINICAL DATA:  Left knee pain after fall. EXAM: LEFT KNEE - 1-2 VIEW COMPARISON:  Radiographs of October 03, 2016. FINDINGS: Status post left below-knee amputation. Surgical staples remain. No other fracture or dislocation is noted. Joint spaces are intact. IMPRESSION: Status post left below-knee amputation. No other fracture or bony abnormality is  noted. Electronically Signed   By: Lupita Raider, M.D.   On: 01/11/2019 13:03   Dg Humerus Left  Result Date: 01/11/2019 CLINICAL DATA:  Pt reports that she had an assisted fall where she was helped to the ground by nurse and nurse aide. Pt now complaining of chest pain, left arm pain, knee pain and left hip pain. Pt had recent surgery to her left leg. EXAM: LEFT HUMERUS - 2+ VIEW COMPARISON:  None. FINDINGS: No fracture or bone lesion. Shoulder and elbow joints are normally aligned. Soft tissues are unremarkable. IMPRESSION: Negative. Electronically Signed   By: Amie Portland M.D.   On: 01/11/2019 13:01   Dg Foot Complete Left  Result Date: 12/17/2018 CLINICAL DATA:  Nonhealing wound at LEFT heel for 3 weeks, diabetes mellitus EXAM: LEFT FOOT - COMPLETE 3+ VIEW COMPARISON:  None FINDINGS: Osseous demineralization. Joint spaces preserved. No acute fracture, dislocation, or bone destruction. Dressing artifacts overlie heel and hindfoot. Small vessel vascular calcifications consistent with history of diabetes mellitus. IMPRESSION: [place] mineralization without acute bony abnormalities. Electronically Signed   By: Ulyses Southward M.D.   On: 12/17/2018 10:01   Dg Hip  Unilat With Pelvis 2-3 Views Left  Result Date: 01/11/2019 CLINICAL DATA:  Left hip pain after fall. EXAM: DG HIP (WITH OR WITHOUT PELVIS) 2-3V LEFT COMPARISON:  None. FINDINGS: There is no evidence of hip fracture or dislocation. There is no evidence of arthropathy or other focal bone abnormality. IMPRESSION: Negative. Electronically Signed   By: Lupita Raider, M.D.   On: 01/11/2019 13:02   Vas [place] Lower Extremity Arterial Duplex  Result Date: 01/01/2019 LOWER EXTREMITY ARTERIAL DUPLEX STUDY Indications: 12/23/2019Recent PTA , thrombolytic therapy, and stent placement in              the SFA to pop art.  Current ABI: unable to obtain Comparison Study: 11/26/2018 Performing Technologist: Salvadore Farber RVT  Examination Guidelines: A complete  evaluation includes B-mode imaging, spectral Doppler, color Doppler, and power Doppler as needed of all accessible portions of each vessel. Bilateral testing is considered an integral part of a complete examination. Limited examinations for reoccurring indications may be performed as noted.  Left Duplex Findings: +----------+--------+-----+--------+---------+--------+           PSV cm/sRatioStenosisWaveform Comments +----------+--------+-----+--------+---------+--------+ CFA Mid   79                   biphasic          +----------+--------+-----+--------+---------+--------+ DFA       80                   triphasic         +----------+--------+-----+--------+---------+--------+ SFA Prox               occluded         stent    +----------+--------+-----+--------+---------+--------+ SFA Mid                occluded         stent    +----------+--------+-----+--------+---------+--------+ SFA Distal             occluded         stent    +----------+--------+-----+--------+---------+--------+ POP Distal             occluded                  +----------+--------+-----+--------+---------+--------+ ATA Distal             occluded                  +----------+--------+-----+--------+---------+--------+ PTA Distal             occluded                  +----------+--------+-----+--------+---------+--------+  Summary: Left: Patent CFA and deep profunda. Occluded SFA and stent from origin distally. No arterial detected in the SFA, stent, popliteal, posterior tibial, and anterior tibial arteries.  See table(s) above for measurements and observations. Electronically signed by Festus Barren MD on 01/01/2019 at 3:05:19 PM.    Final      CBC Recent Labs  Lab 01/07/19 0426 01/08/19 1443 01/09/19 0354 01/11/19 0747 01/12/19 0444  WBC 11.2* 17.5* 11.3* 14.0* 10.3  HGB 8.2* 8.0* 7.5* 7.0* 8.0*  HCT 28.3* 26.2* 25.4* 23.6* 26.2*  PLT 344 313 311 255 270  MCV 87.6 86.2  88.8 88.4 86.2  MCH 25.4* 26.3 26.2 26.2 26.3  MCHC 29.0* 30.5 29.5* 29.7* 30.5  RDW 17.6* 17.9* 18.6* 19.4* 18.7*    Chemistries  Recent Labs  Lab 01/07/19 0426 01/08/19 1540 01/09/19 0354 01/11/19 0747 01/12/19 0444  NA 135 132* 134* 134* 132*  K 4.1 4.6 4.1 4.5 4.4  CL 98 98 99 100 98  CO2 29 27 29 28 27   GLUCOSE 154* 223* 111* 152* 161*  BUN 8 10 15 18 16   CREATININE 0.67 0.72 0.87 0.85 0.77  CALCIUM 8.1* 8.3* 8.6* 8.5* 8.3*  MG 1.5* 2.0 1.7 1.3* 2.1   ------------------------------------------------------------------------------------------------------------------ estimated creatinine clearance is 99.4 mL/min (by C-G formula based on SCr of 0.77 mg/dL). ------------------------------------------------------------------------------------------------------------------ No results for input(s): HGBA1C in the last 72 hours. ------------------------------------------------------------------------------------------------------------------ No results for input(s): CHOL, HDL, LDLCALC, TRIG, CHOLHDL, LDLDIRECT in the last 72 hours. ------------------------------------------------------------------------------------------------------------------ No results for input(s): TSH, T4TOTAL, T3FREE, THYROIDAB in the last 72 hours.  Invalid input(s): FREET3 ------------------------------------------------------------------------------------------------------------------ No results for input(s): VITAMINB12, FOLATE, FERRITIN, TIBC, IRON, RETICCTPCT in the last 72 hours.  Coagulation profile Recent Labs  Lab 01/07/19 0426 01/11/19 2246  INR 1.01 1.41    No results for input(s): DDIMER in the last 72 hours.  Cardiac Enzymes Recent Labs  Lab 01/11/19 1438 01/11/19 2246 01/12/19 0444  TROPONINI <0.03 0.03* 0.03*   ------------------------------------------------------------------------------------------------------------------ Invalid input(s): POCBNP    Assessment & Plan    Acute respiratory failure with hypoxia Is related to acute diastolic CHF Now patient's respiratory status is normalized  Acute diastolic CHF. Treated with diuresis Continue Lasix IV 1 more day Recent echo done no need to repeat  Sepsis due to UTI with fever 102, Follow urine cultures Discontinue IV cefepime changed to IV ceftriaxone   Anemia of chronic disease and acute blood loss possible due to surgery. PRBC transfusion 1 unit, follow-up hemoglobin.  Diabetes.  Continue sliding scale, NovoLog 3 times daily and Lantus.  Hold metformin.  PVD, s/p left BKA.  Managed by vascular surgeon.  Continue Eliquis and Lipitor.  Morbid obesity.  Diet control and follow-up PCP.  Tobacco abuse.    Smoking cessation was provided by the MD yesterday      Code Status Orders  (From admission, onward)         Start     Ordered   12/31/18 1701  Full code  Continuous     12/31/18 1704        Code Status History    Date Active Date Inactive Code Status Order ID Comments User Context   12/17/2018 1321 12/17/2018 1805 Full Code [id number]  Annice Needyew, Jason S, MD Inpatient   04/02/2018 2241 04/04/2018 1837 Full Code [id number]  Altamese DillingVachhani, Vaibhavkumar, MD Inpatient   04/23/2016 1834 05/02/2016 2018 Full Code [id number]  Katha HammingKonidena, Snehalatha, MD ED               Lab Results  Component Value Date   PLT 270 01/12/2019     Time Spent in minutes  35min  Greater than 50% of time spent in care coordination and counseling patient regarding the condition and plan of care.   Auburn BilberryShreyang Tiffinie Caillier M.D on 01/12/2019 at 1:02 PM  Between 7am to 6pm - Pager - [phone]  After 6pm go to [email or web address] - Social research officer, governmentpassword [email or web address]  [employer]   Office  [phone]

## 2019-01-13 LAB — URINE CULTURE: Culture: >=100000 — AB

## 2019-01-13 LAB — BASIC METABOLIC PANEL
Anion gap: 7 (ref 5–15)
BUN: 17 mg/dL (ref 6–20)
CO2: 28 mmol/L (ref 22–32)
Calcium: 8.1 mg/dL — ABNORMAL LOW (ref 8.9–10.3)
Chloride: 98 mmol/L (ref 98–111)
Creatinine, Ser: 0.82 mg/dL (ref 0.44–1.00)
GFR calc Af Amer: >60 mL/min (ref >60)
GFR calc non Af Amer: >60 mL/min (ref >60)
Glucose, Bld: 131 mg/dL — ABNORMAL HIGH (ref 70–99)
Potassium: 3.8 mmol/L (ref 3.5–5.1)
Sodium: 133 mmol/L — ABNORMAL LOW (ref 135–145)

## 2019-01-13 LAB — CBC
HEMATOCRIT: 27 % — AB (ref 36.0–46.0)
Hemoglobin: 8 g/dL — ABNORMAL LOW (ref 12.0–15.0)
MCH: 26.1 pg (ref 26.0–34.0)
MCHC: 29.6 g/dL — ABNORMAL LOW (ref 30.0–36.0)
MCV: 88.2 fL (ref 80.0–100.0)
Platelets: 278 10*3/uL (ref 150–400)
RBC: 3.06 MIL/uL — ABNORMAL LOW (ref 3.87–5.11)
RDW: 18.8 % — ABNORMAL HIGH (ref 11.5–15.5)
WBC: 7.9 10*3/uL (ref 4.0–10.5)
nRBC: 0 % (ref 0.0–0.2)

## 2019-01-13 LAB — GLUCOSE, CAPILLARY
Glucose-Capillary: 129 mg/dL — ABNORMAL HIGH (ref 70–99)
Glucose-Capillary: 148 mg/dL — ABNORMAL HIGH (ref 70–99)
Glucose-Capillary: 233 mg/dL — ABNORMAL HIGH (ref 70–99)
Glucose-Capillary: 221 mg/dL — ABNORMAL HIGH (ref 70–99)

## 2019-01-13 LAB — MAGNESIUM: Magnesium: 1.6 mg/dL — ABNORMAL LOW (ref 1.7–2.4)

## 2019-01-13 MED ORDER — SODIUM CHLORIDE 0.9 % IV SOLN
1.0000 g | Freq: Three times a day (TID) | INTRAVENOUS | Status: DC
  Administered 2019-01-13 – 2019-01-18 (×15): 1 g via INTRAVENOUS
  Filled 2019-01-13 (×17): qty 1

## 2019-01-13 NOTE — Progress Notes (Signed)
Dressing changed to left BKA dus to moderate amount of bleeding that had saturated through dressing, some redness and warmth to surgical site. Will continue to monitor, patient tolerated dressing change well.

## 2019-01-13 NOTE — Consult Note (Signed)
PHARMACY CONSULT NOTE - FOLLOW UP  Pharmacy Consult for Electrolyte Monitoring and Replacement   Recent Labs: Potassium (mmol/L)  Date Value  01/13/2019 3.8   Magnesium (mg/dL)  Date Value  03/47/4259 1.6 (L)   Calcium (mg/dL)  Date Value  56/38/7564 8.1 (L)   Albumin (g/dL)  Date Value  33/29/5188 1.2 (L)   Phosphorus (mg/dL)  Date Value  41/66/0630 3.4   Sodium (mmol/L)  Date Value  01/13/2019 133 (L)   Correct Calcium: 10.8  Assessment: Pharmacy consulted for electrolyte monitoring and replacement in 58 yo female admitted with LLE pain and ulceration. Patient had hypomagnesemia and hypokalemia on admission.   Goal of Therapy:  Electrolytes WNL   Plan:  K 3.8  Mag 1.6  Scr 0.82  Patient now on lasix 20 mg IV bid Will order Magnesium sulfate 2 gram IV x1. Will F/U up with AM labs and continue to replace as needed.   Angelique Blonder, PharmD Clinical Pharmacist 01/13/2019 7:25 AM

## 2019-01-13 NOTE — Progress Notes (Signed)
6 Days Post-Op   Subjective/Chief Complaint: Notes increased pain overnight. Per nursing the BKA dressing became saturated- serous.   Objective: Vital signs in last 24 hours: Temp:  [98.2 F (36.8 C)-99.6 F (37.6 C)] 98.2 F (36.8 C) (01/19 0549) Pulse Rate:  [102-120] 102 (01/19 0549) Resp:  [16-20] 20 (01/19 0549) BP: (106-127)/(49-77) 127/66 (01/19 0549) SpO2:  [94 %-98 %] 98 % (01/19 0549) Last BM Date: 01/11/19  Intake/Output from previous day: 01/18 0701 - 01/19 0700 In: 249.5 [P.O.:120; I.V.:29.5; IV Piggyback:100] Out: 450 [Urine:450] Intake/Output this shift: Total I/O In: -  Out: 280 [Urine:280]  General appearance: alert and mild distress Extremities: LEFT BKA stump- incision intact, now with mild/moderate drainage, soft, tender, now with erythema along incision  Lab Results:  Recent Labs    01/12/19 0444 01/13/19 0355  WBC 10.3 7.9  HGB 8.0* 8.0*  HCT 26.2* 27.0*  PLT 270 278   BMET Recent Labs    01/12/19 0444 01/13/19 0355  NA 132* 133*  K 4.4 3.8  CL 98 98  CO2 27 28  GLUCOSE 161* 131*  BUN 16 17  CREATININE 0.77 0.82  CALCIUM 8.3* 8.1*   PT/INR Recent Labs    01/11/19 2246  LABPROT 17.1*  INR 1.41   ABG No results for input(s): PHART, HCO3 in the last 72 hours.  Invalid input(s): PCO2, PO2  Studies/Results: Dg Chest 2 View  Result Date: 01/11/2019 CLINICAL DATA:  Chest pain after fall. EXAM: CHEST - 2 VIEW COMPARISON:  Radiographs of April 02, 2018. FINDINGS: Stable cardiomegaly with central pulmonary vascular congestion. No pneumothorax or pleural effusion is noted. No consolidative process is noted. The visualized skeletal structures are unremarkable. IMPRESSION: Stable cardiomegaly with central pulmonary vascular congestion. Electronically Signed   By: Lupita RaiderJames  Green Jr, M.D.   On: 01/11/2019 13:05   Dg Knee 1-2 Views Left  Result Date: 01/11/2019 CLINICAL DATA:  Left knee pain after fall. EXAM: LEFT KNEE - 1-2 VIEW COMPARISON:   Radiographs of October 03, 2016. FINDINGS: Status post left below-knee amputation. Surgical staples remain. No other fracture or dislocation is noted. Joint spaces are intact. IMPRESSION: Status post left below-knee amputation. No other fracture or bony abnormality is noted. Electronically Signed   By: Lupita RaiderJames  Green Jr, M.D.   On: 01/11/2019 13:03   Dg Humerus Left  Result Date: 01/11/2019 CLINICAL DATA:  Pt reports that she had an assisted fall where she was helped to the ground by nurse and nurse aide. Pt now complaining of chest pain, left arm pain, knee pain and left hip pain. Pt had recent surgery to her left leg. EXAM: LEFT HUMERUS - 2+ VIEW COMPARISON:  None. FINDINGS: No fracture or bone lesion. Shoulder and elbow joints are normally aligned. Soft tissues are unremarkable. IMPRESSION: Negative. Electronically Signed   By: Amie Portlandavid  Ormond M.D.   On: 01/11/2019 13:01   Dg Hip Unilat With Pelvis 2-3 Views Left  Result Date: 01/11/2019 CLINICAL DATA:  Left hip pain after fall. EXAM: DG HIP (WITH OR WITHOUT PELVIS) 2-3V LEFT COMPARISON:  None. FINDINGS: There is no evidence of hip fracture or dislocation. There is no evidence of arthropathy or other focal bone abnormality. IMPRESSION: Negative. Electronically Signed   By: Lupita RaiderJames  Green Jr, M.D.   On: 01/11/2019 13:02    Anti-infectives: Anti-infectives (From admission, onward)   Start     Dose/Rate Route Frequency Ordered Stop   01/12/19 1100  cefTRIAXone (ROCEPHIN) 1 g in sodium chloride 0.9 % 100  mL IVPB     1 g 200 mL/hr over 30 Minutes Intravenous Every 24 hours 01/12/19 0944     01/12/19 0800  ceFEPIme (MAXIPIME) 2 g in sodium chloride 0.9 % 100 mL IVPB  Status:  Discontinued     2 g 200 mL/hr over 30 Minutes Intravenous Every 12 hours 01/11/19 1826 01/12/19 0944   01/11/19 1745  cefTRIAXone (ROCEPHIN) 1 g in sodium chloride 0.9 % 100 mL IVPB  Status:  Discontinued     1 g 200 mL/hr over 30 Minutes Intravenous Every 24 hours 01/11/19 1736  01/11/19 1741   01/11/19 1745  ceFEPIme (MAXIPIME) 2 g in sodium chloride 0.9 % 100 mL IVPB     2 g 200 mL/hr over 30 Minutes Intravenous  Once 01/11/19 1741 01/11/19 1956   01/07/19 1356  ceFAZolin (ANCEF) 2-4 GM/100ML-% IVPB    Note to Pharmacy:  Lorrene Reid   : cabinet override      01/07/19 1356 01/07/19 1426   01/07/19 0500  ceFAZolin (ANCEF) IVPB 2g/100 mL premix    Note to Pharmacy:  Send with pt to OR.   2 g 200 mL/hr over 30 Minutes Intravenous On call 01/04/19 1307 01/07/19 1456   01/02/19 0000  ceFAZolin (ANCEF) IVPB 2g/100 mL premix    Note to Pharmacy:  Send with pt to OR   2 g 200 mL/hr over 30 Minutes Intravenous On call 01/01/19 1012 01/01/19 1253   01/01/19 0500  ceFAZolin (ANCEF) IVPB 2g/100 mL premix    Note to Pharmacy:  Please send with patient to specials   2 g 200 mL/hr over 30 Minutes Intravenous On call 12/31/18 1708 01/01/19 0800   12/31/18 1600  ceFAZolin (ANCEF) IVPB 2g/100 mL premix  Status:  Discontinued     2 g 200 mL/hr over 30 Minutes Intravenous  Once 12/31/18 1547 12/31/18 1810      Assessment/Plan: s/p Procedure(s): AMPUTATION BELOW KNEE (Left) Concern of possible early stump infection.  Will plan for I&D of stump tomorrow per Dr. Wyn Quaker.  IV ABX Pain control  LOS: 13 days    Bertram Denver 01/13/2019

## 2019-01-13 NOTE — Progress Notes (Signed)
PT Cancellation Note  Patient Details Name: [name] MRN: [id number] DOB: 01-May-1961   Cancelled Treatment:    Reason Eval/Treat Not Completed: Patient declined, no reason specified Patient was sleeping when PT entered and reports she wants to rest as she has to have a surgical revision tomorrow. Patient also draining through her wound onto bed. PT notified nurse who reported she was going to change dressing, and confirmed patient to have surgery tomorrow   Staci Acosta 01/13/2019, 4:06 PM

## 2019-01-13 NOTE — Progress Notes (Signed)
[employer] - [place] [facility]                                                                                                                                                                                  Patient Demographics   [name], is a 58 y.o. female, DOB - 1961/03/23, VWU:[id number]  Admit date - 12/31/2018   Admitting Physician Annice Needy, MD  Outpatient Primary MD for the patient is Dortha Kern, MD   LOS - 13  Subjective: Denies any shortness of breath   Review of Systems:   CONSTITUTIONAL: No documented fever. No fatigue, weakness. No weight gain, no weight loss.  EYES: No blurry or double vision.  ENT: No tinnitus. No postnasal drip. No redness of the oropharynx.  RESPIRATORY: No cough, no wheeze, no hemoptysis. No dyspnea.  CARDIOVASCULAR: No chest pain. No orthopnea. No palpitations. No syncope.  GASTROINTESTINAL: No nausea, no vomiting or diarrhea. No abdominal pain. No melena or hematochezia.  GENITOURINARY: No dysuria or hematuria.  ENDOCRINE: No polyuria or nocturia. No heat or cold intolerance.  HEMATOLOGY: No anemia. No bruising. No bleeding.  INTEGUMENTARY: No rashes. No lesions.  MUSCULOSKELETAL: No arthritis. No swelling. No gout.  NEUROLOGIC: No numbness, tingling, or ataxia. No seizure-type activity.  PSYCHIATRIC: No anxiety. No insomnia. No ADD.    Vitals:   Vitals:   01/12/19 2016 01/13/19 0549 01/13/19 0954 01/13/19 1157  BP: (!) 106/49 127/66 140/65 (!) 129/58  Pulse: (!) 113 (!) 102 (!) 109 99  Resp: 20 20    Temp: 98.7 F (37.1 C) 98.2 F (36.8 C)  97.9 F (36.6 C)  TempSrc: Oral Oral  Oral  SpO2: 94% 98%  100%  Weight:      Height:        Wt Readings from Last 3 Encounters:  01/11/19 121.9 kg  12/17/18 119.7 kg  12/04/18 111.6 kg     Intake/Output Summary (Last 24 hours) at 01/13/2019 1158 Last data filed at 01/13/2019 0834 Gross per 24 hour  Intake 249.47 ml  Output 730 ml  Net -480.53 ml     Physical Exam:   GENERAL: Pleasant-appearing in no apparent distress.  HEAD, EYES, EARS, NOSE AND THROAT: Atraumatic, normocephalic. Extraocular muscles are intact. Pupils equal and reactive to light. Sclerae anicteric. No conjunctival injection. No oro-pharyngeal erythema.  NECK: Supple. There is no jugular venous distention. No bruits, no lymphadenopathy, no thyromegaly.  HEART: Regular rate and rhythm,. No murmurs, no rubs, no clicks.  LUNGS: Clear to auscultation bilaterally. No rales or rhonchi. No wheezes.  ABDOMEN: Soft, flat, nontender, nondistended. Has good bowel sounds. No hepatosplenomegaly appreciated.  EXTREMITIES: No evidence  of any cyanosis, clubbing, or peripheral edema.  +2 pedal and radial pulses bilaterally.  NEUROLOGIC: The patient is alert, awake, and oriented x3 with no focal motor or sensory deficits appreciated bilaterally.  SKIN: Moist and warm with no rashes appreciated.  Psych: Not anxious, depressed LN: No inguinal LN enlargement    Antibiotics   Anti-infectives (From admission, onward)   Start     Dose/Rate Route Frequency Ordered Stop   01/13/19 1200  meropenem (MERREM) 1 g in sodium chloride 0.9 % 100 mL IVPB     1 g 200 mL/hr over 30 Minutes Intravenous Every 8 hours 01/13/19 0946     01/12/19 1100  cefTRIAXone (ROCEPHIN) 1 g in sodium chloride 0.9 % 100 mL IVPB  Status:  Discontinued     1 g 200 mL/hr over 30 Minutes Intravenous Every 24 hours 01/12/19 0944 01/13/19 0946   01/12/19 0800  ceFEPIme (MAXIPIME) 2 g in sodium chloride 0.9 % 100 mL IVPB  Status:  Discontinued     2 g 200 mL/hr over 30 Minutes Intravenous Every 12 hours 01/11/19 1826 01/12/19 0944   01/11/19 1745  cefTRIAXone (ROCEPHIN) 1 g in sodium chloride 0.9 % 100 mL IVPB  Status:  Discontinued     1 g 200 mL/hr over 30 Minutes Intravenous Every 24 hours 01/11/19 1736 01/11/19 1741   01/11/19 1745  ceFEPIme (MAXIPIME) 2 g in sodium chloride 0.9 % 100 mL IVPB     2 g 200 mL/hr  over 30 Minutes Intravenous  Once 01/11/19 1741 01/11/19 1956   01/07/19 1356  ceFAZolin (ANCEF) 2-4 GM/100ML-% IVPB    Note to Pharmacy:  Lorrene ReidJackson, Pamela   : cabinet override      01/07/19 1356 01/07/19 1426   01/07/19 0500  ceFAZolin (ANCEF) IVPB 2g/100 mL premix    Note to Pharmacy:  Send with pt to OR.   2 g 200 mL/hr over 30 Minutes Intravenous On call 01/04/19 1307 01/07/19 1456   01/02/19 0000  ceFAZolin (ANCEF) IVPB 2g/100 mL premix    Note to Pharmacy:  Send with pt to OR   2 g 200 mL/hr over 30 Minutes Intravenous On call 01/01/19 1012 01/01/19 1253   01/01/19 0500  ceFAZolin (ANCEF) IVPB 2g/100 mL premix    Note to Pharmacy:  Please send with patient to specials   2 g 200 mL/hr over 30 Minutes Intravenous On call 12/31/18 1708 01/01/19 0800   12/31/18 1600  ceFAZolin (ANCEF) IVPB 2g/100 mL premix  Status:  Discontinued     2 g 200 mL/hr over 30 Minutes Intravenous  Once 12/31/18 1547 12/31/18 1810      Medications   Scheduled Meds: . apixaban  5 mg Oral BID  . aspirin EC  81 mg Oral Daily  . atorvastatin  20 mg Oral QHS  . furosemide  20 mg Intravenous BID  . gabapentin  1,200 mg Oral TID  . insulin aspart  0-20 Units Subcutaneous TID WC  . insulin aspart  0-5 Units Subcutaneous QHS  . insulin aspart  4 Units Subcutaneous TID WC  . insulin glargine  35 Units Subcutaneous Daily  . ipratropium-albuterol  3 mL Nebulization BID  . ketorolac  30 mg Intravenous Once  . latanoprost  1 drop Both Eyes QHS  . lisinopril  2.5 mg Oral Daily  . metoprolol tartrate  25 mg Oral BID  . multivitamin-lutein  1 capsule Oral Daily  . nicotine  14 mg Transdermal Daily  . nortriptyline  50 mg Oral QHS  . oxybutynin  10 mg Oral QHS  . pantoprazole  20 mg Oral Daily  . pregabalin  75 mg Oral BID  . ENSURE MAX PROTEIN  11 oz Oral BID  . sodium chloride  2 g Oral BID WC  . traZODone  50 mg Oral QHS   Continuous Infusions: . sodium chloride Stopped (01/12/19 1458)  . meropenem  (MERREM) IV     PRN Meds:.sodium chloride, acetaminophen **OR** acetaminophen, bisacodyl, clonazepam, ipratropium-albuterol, ketorolac, loperamide, morphine injection, nicotine polacrilex, ondansetron (ZOFRAN) IV, oxyCODONE, senna   Data Review:   Micro Results Recent Results (from the past 240 hour(s))  Urine Culture     Status: Abnormal   Collection Time: 01/11/19  3:23 PM  Result Value Ref Range Status   Specimen Description   Final    URINE, RANDOM Performed at [facility], [address] [place], [place] [address]    Special Requests   Final    NONE Performed at [facility], [address] [place], [place] [address]    Culture (A)  Final    >=100,000 COLONIES/mL ESCHERICHIA COLI Confirmed Extended Spectrum Beta-Lactamase Producer (ESBL).  In bloodstream infections from ESBL organisms, carbapenems are preferred over piperacillin/tazobactam. They are shown to have a lower risk of mortality.    Report Status 01/13/2019 FINAL  Final   Organism ID, Bacteria ESCHERICHIA COLI (A)  Final      Susceptibility   Escherichia coli - MIC*    AMPICILLIN >=32 RESISTANT Resistant     CEFAZOLIN >=64 RESISTANT Resistant     CEFTRIAXONE >=64 RESISTANT Resistant     CIPROFLOXACIN >=4 RESISTANT Resistant     GENTAMICIN <=1 SENSITIVE Sensitive     IMIPENEM <=0.25 SENSITIVE Sensitive     NITROFURANTOIN 128 RESISTANT Resistant     TRIMETH/SULFA <=20 SENSITIVE Sensitive     AMPICILLIN/SULBACTAM 16 INTERMEDIATE Intermediate     PIP/TAZO 64 INTERMEDIATE Intermediate     Extended ESBL POSITIVE Resistant     * >=100,000 COLONIES/mL ESCHERICHIA COLI  CULTURE, BLOOD (ROUTINE X 2) w Reflex to ID Panel     Status: None (Preliminary result)   Collection Time: 01/11/19  6:04 PM  Result Value Ref Range Status   Specimen Description BLOOD LEFTARM  Final   Special Requests   Final    BOTTLES DRAWN AEROBIC AND ANAEROBIC Blood Culture adequate volume   Culture   Final    NO GROWTH  2 DAYS Performed at [facility], [address], [place], [place] [address]    Report Status PENDING  Incomplete  CULTURE, BLOOD (ROUTINE X 2) w Reflex to ID Panel     Status: None (Preliminary result)   Collection Time: 01/11/19  6:18 PM  Result Value Ref Range Status   Specimen Description BLOOD RAC  Final   Special Requests   Final    BOTTLES DRAWN AEROBIC AND ANAEROBIC Blood Culture adequate volume   Culture   Final    NO GROWTH 2 DAYS Performed at [facility], [address], [place], [place] [address]    Report Status PENDING  Incomplete    Radiology Reports Dg Chest 2 View  Result Date: 01/11/2019 CLINICAL DATA:  Chest pain after fall. EXAM: CHEST - 2 VIEW COMPARISON:  Radiographs of April 02, 2018. FINDINGS: Stable cardiomegaly with central pulmonary vascular congestion. No pneumothorax or pleural effusion is noted. No consolidative process is noted. The visualized skeletal structures are unremarkable. IMPRESSION: Stable cardiomegaly with  central pulmonary vascular congestion. Electronically Signed   By: Lupita Raider, M.D.   On: 01/11/2019 13:05   Dg Knee 1-2 Views Left  Result Date: 01/11/2019 CLINICAL DATA:  Left knee pain after fall. EXAM: LEFT KNEE - 1-2 VIEW COMPARISON:  Radiographs of October 03, 2016. FINDINGS: Status post left below-knee amputation. Surgical staples remain. No other fracture or dislocation is noted. Joint spaces are intact. IMPRESSION: Status post left below-knee amputation. No other fracture or bony abnormality is noted. Electronically Signed   By: Lupita Raider, M.D.   On: 01/11/2019 13:03   Dg Humerus Left  Result Date: 01/11/2019 CLINICAL DATA:  Pt reports that she had an assisted fall where she was helped to the ground by nurse and nurse aide. Pt now complaining of chest pain, left arm pain, knee pain and left hip pain. Pt had recent surgery to her left leg. EXAM: LEFT HUMERUS - 2+ VIEW COMPARISON:  None. FINDINGS: No fracture  or bone lesion. Shoulder and elbow joints are normally aligned. Soft tissues are unremarkable. IMPRESSION: Negative. Electronically Signed   By: Amie Portland M.D.   On: 01/11/2019 13:01   Dg Foot Complete Left  Result Date: 12/17/2018 CLINICAL DATA:  Nonhealing wound at LEFT heel for 3 weeks, diabetes mellitus EXAM: LEFT FOOT - COMPLETE 3+ VIEW COMPARISON:  None FINDINGS: Osseous demineralization. Joint spaces preserved. No acute fracture, dislocation, or bone destruction. Dressing artifacts overlie heel and hindfoot. Small vessel vascular calcifications consistent with history of diabetes mellitus. IMPRESSION: [place] mineralization without acute bony abnormalities. Electronically Signed   By: Ulyses Southward M.D.   On: 12/17/2018 10:01   Dg Hip Unilat With Pelvis 2-3 Views Left  Result Date: 01/11/2019 CLINICAL DATA:  Left hip pain after fall. EXAM: DG HIP (WITH OR WITHOUT PELVIS) 2-3V LEFT COMPARISON:  None. FINDINGS: There is no evidence of hip fracture or dislocation. There is no evidence of arthropathy or other focal bone abnormality. IMPRESSION: Negative. Electronically Signed   By: Lupita Raider, M.D.   On: 01/11/2019 13:02   Vas [place] Lower Extremity Arterial Duplex  Result Date: 01/01/2019 LOWER EXTREMITY ARTERIAL DUPLEX STUDY Indications: 12/23/2019Recent PTA , thrombolytic therapy, and stent placement in              the SFA to pop art.  Current ABI: unable to obtain Comparison Study: 11/26/2018 Performing Technologist: Salvadore Farber RVT  Examination Guidelines: A complete evaluation includes B-mode imaging, spectral Doppler, color Doppler, and power Doppler as needed of all accessible portions of each vessel. Bilateral testing is considered an integral part of a complete examination. Limited examinations for reoccurring indications may be performed as noted.  Left Duplex Findings: +----------+--------+-----+--------+---------+--------+           PSV cm/sRatioStenosisWaveform Comments  +----------+--------+-----+--------+---------+--------+ CFA Mid   79                   biphasic          +----------+--------+-----+--------+---------+--------+ DFA       80                   triphasic         +----------+--------+-----+--------+---------+--------+ SFA Prox               occluded         stent    +----------+--------+-----+--------+---------+--------+ SFA Mid                occluded  stent    +----------+--------+-----+--------+---------+--------+ SFA Distal             occluded         stent    +----------+--------+-----+--------+---------+--------+ POP Distal             occluded                  +----------+--------+-----+--------+---------+--------+ ATA Distal             occluded                  +----------+--------+-----+--------+---------+--------+ PTA Distal             occluded                  +----------+--------+-----+--------+---------+--------+  Summary: Left: Patent CFA and deep profunda. Occluded SFA and stent from origin distally. No arterial detected in the SFA, stent, popliteal, posterior tibial, and anterior tibial arteries.  See table(s) above for measurements and observations. Electronically signed by Festus Barren MD on 01/01/2019 at 3:05:19 PM.    Final      CBC Recent Labs  Lab 01/08/19 9741 01/09/19 0354 01/11/19 0747 01/12/19 0444 01/13/19 0355  WBC 17.5* 11.3* 14.0* 10.3 7.9  HGB 8.0* 7.5* 7.0* 8.0* 8.0*  HCT 26.2* 25.4* 23.6* 26.2* 27.0*  PLT 313 311 255 270 278  MCV 86.2 88.8 88.4 86.2 88.2  MCH 26.3 26.2 26.2 26.3 26.1  MCHC 30.5 29.5* 29.7* 30.5 29.6*  RDW 17.9* 18.6* 19.4* 18.7* 18.8*    Chemistries  Recent Labs  Lab 01/08/19 0814 01/09/19 0354 01/11/19 0747 01/12/19 0444 01/13/19 0355  NA 132* 134* 134* 132* 133*  K 4.6 4.1 4.5 4.4 3.8  CL 98 99 100 98 98  CO2 27 29 28 27 28   GLUCOSE 223* 111* 152* 161* 131*  BUN 10 15 18 16 17   CREATININE 0.72 0.87 0.85 0.77 0.82  CALCIUM  8.3* 8.6* 8.5* 8.3* 8.1*  MG 2.0 1.7 1.3* 2.1 1.6*   ------------------------------------------------------------------------------------------------------------------ estimated creatinine clearance is 97 mL/min (by C-G formula based on SCr of 0.82 mg/dL). ------------------------------------------------------------------------------------------------------------------ No results for input(s): HGBA1C in the last 72 hours. ------------------------------------------------------------------------------------------------------------------ No results for input(s): CHOL, HDL, LDLCALC, TRIG, CHOLHDL, LDLDIRECT in the last 72 hours. ------------------------------------------------------------------------------------------------------------------ No results for input(s): TSH, T4TOTAL, T3FREE, THYROIDAB in the last 72 hours.  Invalid input(s): FREET3 ------------------------------------------------------------------------------------------------------------------ No results for input(s): VITAMINB12, FOLATE, FERRITIN, TIBC, IRON, RETICCTPCT in the last 72 hours.  Coagulation profile Recent Labs  Lab 01/07/19 0426 01/11/19 2246  INR 1.01 1.41    No results for input(s): DDIMER in the last 72 hours.  Cardiac Enzymes Recent Labs  Lab 01/11/19 1438 01/11/19 2246 01/12/19 0444  TROPONINI <0.03 0.03* 0.03*   ------------------------------------------------------------------------------------------------------------------ Invalid input(s): POCBNP    Assessment & Plan   Acute respiratory failure with hypoxia Is related to acute diastolic CHF Now patient's respiratory status is normalized  Acute diastolic CHF. Treated with diuresis Continue to oral Lasix  Sepsis due to UTI with ESBL producing E. Coli Treat with IV meropenem, patient will need total of 7 days of meropenem.  On discharge she can be discharged on IM meropenem daily at the facility to complete a 7-day course  Anemia of  chronic disease and acute blood loss possible due to surgery. PRBC transfusion 1 unit, follow-up hemoglobin. Hemoglobin stable, repeat CBC in the morning  Diabetes.  Continue sliding scale, NovoLog 3 times daily and Lantus.  Hold metformin.  PVD, s/p left BKA.  Managed by vascular surgeon.  Continue Eliquis and Lipitor.  Morbid obesity.  Diet control and follow-up PCP.  Tobacco abuse.    Smoking cessation was provided      Code Status Orders  (From admission, onward)         Start     Ordered   12/31/18 1701  Full code  Continuous     12/31/18 1704        Code Status History    Date Active Date Inactive Code Status Order ID Comments User Context   12/17/2018 1321 12/17/2018 1805 Full Code [id number]  Annice Needyew, Jason S, MD Inpatient   04/02/2018 2241 04/04/2018 1837 Full Code [id number]  Altamese DillingVachhani, Vaibhavkumar, MD Inpatient   04/23/2016 1834 05/02/2016 2018 Full Code [id number]  Katha HammingKonidena, Snehalatha, MD ED               Lab Results  Component Value Date   PLT 278 01/13/2019     Time Spent in minutes  35min  Greater than 50% of time spent in care coordination and counseling patient regarding the condition and plan of care.   Auburn BilberryShreyang Ellyana Crigler M.D on 01/13/2019 at 11:58 AM  Between 7am to 6pm - Pager - [phone]  After 6pm go to [email or web address] - Social research officer, governmentpassword [email or web address]  [employer]   Office  [phone]

## 2019-01-14 ENCOUNTER — Encounter: Payer: Self-pay | Admitting: *Deleted

## 2019-01-14 ENCOUNTER — Inpatient Hospital Stay: Payer: Medicare HMO | Admitting: Anesthesiology

## 2019-01-14 ENCOUNTER — Encounter
Admission: AD | Disposition: A | Discharge status: Skilled Nursing Facility | Payer: Self-pay | Source: Home / Self Care | Attending: Vascular Surgery

## 2019-01-14 DIAGNOSIS — T8754 Necrosis of amputation stump, left lower extremity: Secondary | ICD-10-CM

## 2019-01-14 DIAGNOSIS — T8744 Infection of amputation stump, left lower extremity: Secondary | ICD-10-CM

## 2019-01-14 HISTORY — PX: APPLICATION OF WOUND VAC: SHX5189

## 2019-01-14 HISTORY — PX: I & D EXTREMITY: SHX5045

## 2019-01-14 LAB — CBC
HCT: 26.1 % — ABNORMAL LOW (ref 36.0–46.0)
HEMOGLOBIN: 7.8 g/dL — AB (ref 12.0–15.0)
MCH: 26.2 pg (ref 26.0–34.0)
MCHC: 29.9 g/dL — AB (ref 30.0–36.0)
MCV: 87.6 fL (ref 80.0–100.0)
Platelets: 291 10*3/uL (ref 150–400)
RBC: 2.98 MIL/uL — ABNORMAL LOW (ref 3.87–5.11)
RDW: 18.6 % — ABNORMAL HIGH (ref 11.5–15.5)
WBC: 7.6 10*3/uL (ref 4.0–10.5)
nRBC: 0 % (ref 0.0–0.2)

## 2019-01-14 LAB — GLUCOSE, CAPILLARY
Glucose-Capillary: 156 mg/dL — ABNORMAL HIGH (ref 70–99)
Glucose-Capillary: 167 mg/dL — ABNORMAL HIGH (ref 70–99)
Glucose-Capillary: 193 mg/dL — ABNORMAL HIGH (ref 70–99)
Glucose-Capillary: 214 mg/dL — ABNORMAL HIGH (ref 70–99)
Glucose-Capillary: 171 mg/dL — ABNORMAL HIGH (ref 70–99)

## 2019-01-14 LAB — BASIC METABOLIC PANEL
Anion gap: 5 (ref 5–15)
BUN: 19 mg/dL (ref 6–20)
CO2: 29 mmol/L (ref 22–32)
Calcium: 8 mg/dL — ABNORMAL LOW (ref 8.9–10.3)
Chloride: 100 mmol/L (ref 98–111)
Creatinine, Ser: 0.78 mg/dL (ref 0.44–1.00)
GFR calc Af Amer: >60 mL/min (ref >60)
GFR calc non Af Amer: >60 mL/min (ref >60)
Glucose, Bld: 152 mg/dL — ABNORMAL HIGH (ref 70–99)
Potassium: 3.7 mmol/L (ref 3.5–5.1)
Sodium: 134 mmol/L — ABNORMAL LOW (ref 135–145)

## 2019-01-14 LAB — MAGNESIUM: Magnesium: 1.7 mg/dL (ref 1.7–2.4)

## 2019-01-14 SURGERY — IRRIGATION AND DEBRIDEMENT EXTREMITY
Anesthesia: General | Site: Leg Lower | Laterality: Left

## 2019-01-14 MED ORDER — HYDROMORPHONE HCL 1 MG/ML IJ SOLN
2.0000 mg | INTRAMUSCULAR | Status: DC | PRN
  Administered 2019-01-14 – 2019-01-15 (×2): 2 mg via INTRAVENOUS
  Filled 2019-01-14 (×2): qty 2

## 2019-01-14 MED ORDER — LABETALOL HCL 5 MG/ML IV SOLN
INTRAVENOUS | Status: AC
  Filled 2019-01-14: qty 4

## 2019-01-14 MED ORDER — SUCCINYLCHOLINE CHLORIDE 20 MG/ML IJ SOLN
INTRAMUSCULAR | Status: AC
  Filled 2019-01-14: qty 1

## 2019-01-14 MED ORDER — FENTANYL CITRATE (PF) 100 MCG/2ML IJ SOLN
INTRAMUSCULAR | Status: AC
  Filled 2019-01-14: qty 2

## 2019-01-14 MED ORDER — MAGNESIUM SULFATE 2 GM/50ML IV SOLN
2.0000 g | Freq: Once | INTRAVENOUS | Status: AC
  Administered 2019-01-14: 2 g via INTRAVENOUS
  Filled 2019-01-14: qty 50

## 2019-01-14 MED ORDER — CEFAZOLIN SODIUM-DEXTROSE 2-4 GM/100ML-% IV SOLN
2.0000 g | Freq: Once | INTRAVENOUS | Status: AC
  Administered 2019-01-14: 2 g via INTRAVENOUS

## 2019-01-14 MED ORDER — CEFAZOLIN SODIUM-DEXTROSE 2-4 GM/100ML-% IV SOLN
INTRAVENOUS | Status: AC
  Filled 2019-01-14: qty 100

## 2019-01-14 MED ORDER — ONDANSETRON HCL 4 MG/2ML IJ SOLN
INTRAMUSCULAR | Status: AC
  Filled 2019-01-14: qty 2

## 2019-01-14 MED ORDER — LIDOCAINE HCL (PF) 2 % IJ SOLN
INTRAMUSCULAR | Status: AC
  Filled 2019-01-14: qty 10

## 2019-01-14 MED ORDER — FENTANYL CITRATE (PF) 100 MCG/2ML IJ SOLN
INTRAMUSCULAR | Status: AC
  Administered 2019-01-14: 25 ug via INTRAVENOUS
  Filled 2019-01-14: qty 2

## 2019-01-14 MED ORDER — PROPOFOL 10 MG/ML IV BOLUS
INTRAVENOUS | Status: AC
  Filled 2019-01-14: qty 20

## 2019-01-14 MED ORDER — PHENYLEPHRINE HCL 10 MG/ML IJ SOLN
INTRAMUSCULAR | Status: AC
  Filled 2019-01-14: qty 1

## 2019-01-14 MED ORDER — LIDOCAINE HCL (CARDIAC) PF 100 MG/5ML IV SOSY
PREFILLED_SYRINGE | INTRAVENOUS | Status: DC | PRN
  Administered 2019-01-14: 60 mg via INTRAVENOUS
  Administered 2019-01-14: 40 mg via INTRAVENOUS

## 2019-01-14 MED ORDER — ONDANSETRON HCL 4 MG/2ML IJ SOLN: 4.0000 mg | Freq: Once | INTRAMUSCULAR | Status: DC | PRN

## 2019-01-14 MED ORDER — SUCCINYLCHOLINE CHLORIDE 20 MG/ML IJ SOLN
INTRAMUSCULAR | Status: DC | PRN
  Administered 2019-01-14: 100 mg via INTRAVENOUS

## 2019-01-14 MED ORDER — ONDANSETRON HCL 4 MG/2ML IJ SOLN
INTRAMUSCULAR | Status: DC | PRN
  Administered 2019-01-14: 4 mg via INTRAVENOUS

## 2019-01-14 MED ORDER — FENTANYL CITRATE (PF) 100 MCG/2ML IJ SOLN
INTRAMUSCULAR | Status: DC | PRN
  Administered 2019-01-14: 100 ug via INTRAVENOUS
  Administered 2019-01-14 (×2): 50 ug via INTRAVENOUS

## 2019-01-14 MED ORDER — FENTANYL CITRATE (PF) 100 MCG/2ML IJ SOLN
25.0000 ug | INTRAMUSCULAR | Status: AC | PRN
  Administered 2019-01-14 (×6): 25 ug via INTRAVENOUS

## 2019-01-14 MED ORDER — LABETALOL HCL 5 MG/ML IV SOLN
INTRAVENOUS | Status: DC | PRN
  Administered 2019-01-14: 5 mg via INTRAVENOUS

## 2019-01-14 MED ORDER — SEVOFLURANE IN SOLN
RESPIRATORY_TRACT | Status: AC
  Filled 2019-01-14: qty 250

## 2019-01-14 MED ORDER — KETOROLAC TROMETHAMINE 30 MG/ML IJ SOLN
30.0000 mg | Freq: Four times a day (QID) | INTRAMUSCULAR | Status: AC
  Administered 2019-01-14 – 2019-01-17 (×12): 30 mg via INTRAVENOUS
  Filled 2019-01-14 (×13): qty 1

## 2019-01-14 MED ORDER — VITAMIN C 500 MG PO TABS
250.0000 mg | ORAL_TABLET | Freq: Two times a day (BID) | ORAL | Status: DC
  Administered 2019-01-14 – 2019-01-18 (×8): 250 mg via ORAL
  Filled 2019-01-14 (×8): qty 1

## 2019-01-14 MED ORDER — SODIUM CHLORIDE 0.9 % IV SOLN
INTRAVENOUS | Status: DC
  Administered 2019-01-14: 08:00:00 via INTRAVENOUS

## 2019-01-14 MED ORDER — PROPOFOL 10 MG/ML IV BOLUS
INTRAVENOUS | Status: DC | PRN
  Administered 2019-01-14: 150 mg via INTRAVENOUS

## 2019-01-14 SURGICAL SUPPLY — 36 items
BRUSH SCRUB EZ  4% CHG (MISCELLANEOUS) ×2
BRUSH SCRUB EZ 4% CHG (MISCELLANEOUS) ×2 IMPLANT
CANISTER SUCT 1200ML W/VALVE (MISCELLANEOUS) ×4 IMPLANT
CHLORAPREP W/TINT 26ML (MISCELLANEOUS) ×4 IMPLANT
COVER WAND RF STERILE (DRAPES) ×4 IMPLANT
DRAPE INCISE IOBAN 66X45 STRL (DRAPES) ×4 IMPLANT
DRSG VAC ATS MED SENSATRAC (GAUZE/BANDAGES/DRESSINGS) ×6 IMPLANT
ELECT CAUTERY BLADE 6.4 (BLADE) ×4 IMPLANT
ELECT REM PT RETURN 9FT ADLT (ELECTROSURGICAL) ×4
ELECTRODE REM PT RTRN 9FT ADLT (ELECTROSURGICAL) ×2 IMPLANT
GLOVE BIO SURGEON STRL SZ7 (GLOVE) ×4 IMPLANT
GOWN STRL REUS W/ TWL LRG LVL3 (GOWN DISPOSABLE) ×2 IMPLANT
GOWN STRL REUS W/ TWL XL LVL3 (GOWN DISPOSABLE) ×2 IMPLANT
GOWN STRL REUS W/TWL LRG LVL3 (GOWN DISPOSABLE) ×2
GOWN STRL REUS W/TWL XL LVL3 (GOWN DISPOSABLE) ×2
IV NS 1000ML (IV SOLUTION) ×2
IV NS 1000ML BAXH (IV SOLUTION) ×2 IMPLANT
KIT TURNOVER KIT A (KITS) ×4 IMPLANT
LABEL OR SOLS (LABEL) ×4 IMPLANT
NS IRRIG 500ML POUR BTL (IV SOLUTION) ×4 IMPLANT
PACK EXTREMITY ARMC (MISCELLANEOUS) ×4 IMPLANT
PAD PREP 24X41 OB/GYN DISP (PERSONAL CARE ITEMS) ×4 IMPLANT
PULSAVAC PLUS IRRIG FAN TIP (DISPOSABLE) ×8
SOL PREP PVP 2OZ (MISCELLANEOUS) ×4
SOLUTION PREP PVP 2OZ (MISCELLANEOUS) ×2 IMPLANT
SPONGE LAP 18X18 RF (DISPOSABLE) ×4 IMPLANT
SUT ETHILON 4-0 (SUTURE) ×2
SUT ETHILON 4-0 FS2 18XMFL BLK (SUTURE) ×2
SUT VIC AB 3-0 SH 27 (SUTURE) ×4
SUT VIC AB 3-0 SH 27X BRD (SUTURE) ×4 IMPLANT
SUTURE ETHLN 4-0 FS2 18XMF BLK (SUTURE) ×2 IMPLANT
SWAB CULTURE AMIES ANAERIB BLU (MISCELLANEOUS) ×4 IMPLANT
SYR BULB 3OZ (MISCELLANEOUS) ×4 IMPLANT
TIP FAN IRRIG PULSAVAC PLUS (DISPOSABLE) ×2 IMPLANT
WND VAC CANISTER 500ML (MISCELLANEOUS) ×4 IMPLANT
YANKAUER SUCT BULB TIP FLEX NO (MISCELLANEOUS) ×2 IMPLANT

## 2019-01-14 NOTE — Progress Notes (Signed)
PT Cancellation Note  Patient Details Name: [name] L Duch MRN: [id number] DOB: [birth year]   Cancelled Treatment:    Reason Eval/Treat Not Completed: Patient declined, no reason specified. Treatment attempted, pt declines. Pt feeling upset and wishes to defer until tomorrow. Re attempt tomorrow.    Scot DockHeidi E Barnes, PTA 01/14/2019, 4:33 PM

## 2019-01-14 NOTE — Consult Note (Signed)
PHARMACY CONSULT NOTE - FOLLOW UP  Pharmacy Consult for Electrolyte Monitoring and Replacement   Recent Labs: Potassium (mmol/L)  Date Value  01/14/2019 3.7   Magnesium (mg/dL)  Date Value  38/88/2800 1.7   Calcium (mg/dL)  Date Value  34/91/7915 8.0 (L)   Albumin (g/dL)  Date Value  05/69/7948 1.2 (L)   Phosphorus (mg/dL)  Date Value  01/65/5374 3.4   Sodium (mmol/L)  Date Value  01/14/2019 134 (L)   Correct Calcium: 10.8  Assessment: Pharmacy consulted for electrolyte monitoring and replacement in 58 yo female admitted with LLE pain and ulceration. Patient had hypomagnesemia and hypokalemia on admission.   Goal of Therapy:  Electrolytes WNL   Plan:  K 3.7  Mag 1.7  Scr 0.78  Patient now on lasix 20 mg IV bid and Sodium chloride 2 gm tabs BID. Will order Magnesium sulfate 2 gram IV x1. Will F/U up with AM labs and continue to replace as needed.   Angelique Blonder, PharmD Clinical Pharmacist 01/14/2019 7:44 AM

## 2019-01-14 NOTE — Progress Notes (Signed)
MD aware of increased pain. IV dilaudid ordered PRN.

## 2019-01-14 NOTE — Progress Notes (Signed)
OT Cancellation Note  Patient Details Name: [name] MRN: [id number] DOB: 09/20/1961   Cancelled Treatment:    Reason Eval/Treat Not Completed: Patient at procedure or test/ unavailable. Pt out of room for procedure. Per chart review, planned for I&D of stump today. Will re-attempt OT treatment at later date/time as pt is available and medically appropriate.   Richrd PrimeJamie Stiller, MPH, MS, OTR/L ascom [phone] 01/14/19, 8:01 AM

## 2019-01-14 NOTE — Clinical Social Work Note (Signed)
CSW notified that patient had to go back to surgery this morning and will need to be in the hospital longer than expected. Per MD, patient will need another 2-3 days. CSW notified Tresa EndoKelly at [facility] of change. CSW also [facility] to notify of change. Per Tresa EndoKelly, [facility], patient will need a new authorization started once she is closer to discharge. CSW will send updated clinicals once available to start new authorization. CSW will continue to follow for discharge planning.   Ruthe Mannanandace Eli Adami MSW, [address]CSWA [phone]

## 2019-01-14 NOTE — Anesthesia Preprocedure Evaluation (Signed)
Anesthesia Evaluation  Patient identified by MRN, date of birth, ID band Patient awake    Reviewed: Allergy & Precautions, H&P , NPO status , Patient's Chart, lab work & pertinent test results, reviewed documented beta blocker date and time   Airway Mallampati: II  TM Distance: >3 FB Neck ROM: full    Dental  (+) Teeth Intact   Pulmonary neg pulmonary ROS, asthma , Current Smoker,    Pulmonary exam normal        Cardiovascular Exercise Tolerance: Poor hypertension, On Medications + Peripheral Vascular Disease  negative cardio ROS Normal cardiovascular exam Rate:Normal     Neuro/Psych Anxiety  Neuromuscular disease negative psych ROS   GI/Hepatic negative GI ROS, Neg liver ROS, GERD  Medicated,  Endo/Other  negative endocrine ROSdiabetes, Poorly Controlled, Type 1, Insulin Dependent  Renal/GU CRFRenal disease  negative genitourinary   Musculoskeletal   Abdominal   Peds  Hematology negative hematology ROS (+)   Anesthesia Other Findings   Reproductive/Obstetrics negative OB ROS                             Anesthesia Physical  Anesthesia Plan  ASA: III and emergent  Anesthesia Plan: General   Post-op Pain Management:    Induction: Intravenous  PONV Risk Score and Plan:   Airway Management Planned: Oral ETT  Additional Equipment:   Intra-op Plan:   Post-operative Plan: Extubation in OR  Informed Consent: I have reviewed the patients History and Physical, chart, labs and discussed the procedure including the risks, benefits and alternatives for the proposed anesthesia with the patient or authorized representative who has indicated his/her understanding and acceptance.       Plan Discussed with: CRNA and Surgeon  Anesthesia Plan Comments:         Anesthesia Quick Evaluation

## 2019-01-14 NOTE — Progress Notes (Signed)
Nutrition Follow Up Note   DOCUMENTATION CODES:   Morbid obesity  INTERVENTION:   Ensure Max protein supplement BID, each supplement provides 150kcal and 30g of protein.  Ocuvite daily for wound healing (provides zinc, vitamin A, vitamin C, Vitamin E, copper, and selenium)  Vitamin C 250mg  po BID   Consider carbohydrate modified diet   NUTRITION DIAGNOSIS:   Increased nutrient needs related to wound healing as evidenced by increased estimated needs.  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   Pt s/p L BKA 1/13 Pt s/p I & D with VAC placement 1/20  Pt with fairly good appetite and oral intake since admit; pt eating 50-100% of meals up until the last day or so. Pt is drinking some Ensure Max and refusing some. Per chart, pt down 11lbs since BKA but overall is up ~17lbs since admit. Pt -4.6L on I & O's. UOP x 24 hrs. Pt does have some edema. Per chart review, pt continues to smoke while admitted. Pt educated on the effect of smoking on healing. Will add vitamin C supplementation to support wound healing and utilization from smoking. Recommend continue supplements and vitamins after discharge until healing complete.   Medications reviewed and include: aspirin, lasix, insulin (novolog), ocuvite, nicotine, protonix, NaCl tab, oxycodone, NaCl @50ml /hr, cefazolin, Mg sulfate, meropenem   Labs reviewed: Na 134(L), Mg 1.7 wnl Hgb 7.8(L), Hct 26.1(L) cbgs- 156, 167 x 24 hrs  Diet Order:   Diet Order             Diet regular Room service appropriate? Yes; Fluid consistency: Thin  Diet effective now             EDUCATION NEEDS:   Education needs have been addressed  Skin:  Skin Assessment: Skin Integrity Issues:(incision with VAC L leg s/p BKA; fasciotomy wounds R leg)  Last BM:  1/19- type 2   Height:   Ht Readings from Last 1 Encounters:  12/31/18 5\' 3"  (1.6 m)   Weight:   Wt Readings from Last 1 Encounters:  01/11/19 121.9 kg   Ideal Body Weight:  49 kg(adjusted for L BKA)  BMI:  Body mass index is 47.61 kg/m.  Estimated Nutritional Needs:   Kcal:  2100-2400  Protein:  100-110 grams  Fluid:  2.1-2.4 L/day  Betsey Holiday MS, RD, LDN Pager #- [phone] Office#- [phone] After Hours Pager: [phone]

## 2019-01-14 NOTE — Op Note (Signed)
    OPERATIVE NOTE   PROCEDURE: 1. 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 female who presents with murky drainage from the lateral aspect of the left BKA.  She has had fevers over the weekend and is having increasing pain.  This needs to be drained and cultures will be sent.  Risks and benefits are discussed.  DESCRIPTION: After obtaining full informed written consent, the patient was brought back to the operating room and placed supine upon the operating table.  The patient received IV antibiotics prior to induction.  After obtaining adequate anesthesia, the patient was prepped and draped in the standard fashion.  The wound was then opened on the lateral aspect with about 10 staples or sutures were removed and excisional debridement was performed to the skin and soft tissue to remove all clearly non-viable tissue.  The tissue was taken back to bleeding tissue that appeared viable.  There was also a bit of drainage from the more medial part of the wound and several staples were taken out there and the wound was probed with a small amount of murky fluid evacuated medial as well.  The debridement was performed with Metzenbaum scissors and scalpel and encompassed an area of approximately 30 cm2.  The wound was irrigated copiously with pulse lavage saline.  After all clearly non-viable tissue was removed, there was not any clearly exposed bone and the wound was largely intact.  I cut a medium VAC sponge to cover the entirety of the wound but larger on the lateral aspect to fit more deep into the  wound.  Strips of [place] were used and an occlusive seal was obtained. The patient was then awakened from anesthesia and taken to the recovery room in stable condition having tolerated the procedure well.  COMPLICATIONS: none  CONDITION: stable  Festus Barren  01/14/2019, 9:06 AM   This note was created with Dragon Medical transcription system. Any errors in dictation are purely unintentional.

## 2019-01-14 NOTE — Anesthesia Postprocedure Evaluation (Signed)
Anesthesia Post Note  Patient: [name]  Procedure(s) Performed: IRRIGATION AND DEBRIDEMENT EXTREMITY-LEFT BKA (Left Leg Lower) APPLICATION OF WOUND VAC (Left Knee)  Patient location during evaluation: PACU Anesthesia Type: General Level of consciousness: awake and alert and oriented Pain management: pain level controlled Vital Signs Assessment: post-procedure vital signs reviewed and stable Respiratory status: spontaneous breathing Cardiovascular status: blood pressure returned to baseline Anesthetic complications: no     Last Vitals:  Vitals:   01/14/19 1046 01/14/19 1252  BP: 124/62 118/60  Pulse: 100 (!) 113  Resp: 18 20  Temp: 36.6 C 36.8 C  SpO2: 99% 93%    Last Pain:  Vitals:   01/14/19 1254  TempSrc:   PainSc: 9                  Swanson Farnell

## 2019-01-14 NOTE — Anesthesia Procedure Notes (Signed)
Procedure Name: Intubation Date/Time: 01/14/2019 8:34 AM Performed by: Jonna Clark, CRNA Pre-anesthesia Checklist: Patient identified, Patient being monitored, Timeout performed, Emergency Drugs available and Suction available Patient Re-evaluated:Patient Re-evaluated prior to induction Oxygen Delivery Method: Circle system utilized Preoxygenation: Pre-oxygenation with 100% oxygen Induction Type: IV induction Ventilation: Mask ventilation without difficulty Laryngoscope Size: Mac and 3 Grade View: Grade I Tube type: Oral Tube size: 7.0 mm Number of attempts: 1 Placement Confirmation: ETT inserted through vocal cords under direct vision,  positive ETCO2 and breath sounds checked- equal and bilateral Secured at: 21 cm Tube secured with: Tape Dental Injury: Teeth and Oropharynx as per pre-operative assessment

## 2019-01-14 NOTE — H&P (Signed)
[facility] History & Physical Update  The patient was interviewed and re-examined.  The patient's previous History and Physical has been reviewed and is unchanged.  There is no change in the plan of care. We plan to proceed with the scheduled procedure.  Festus Barren, MD  01/14/2019, 8:03 AM

## 2019-01-14 NOTE — Anesthesia Post-op Follow-up Note (Signed)
Anesthesia QCDR form completed.        

## 2019-01-14 NOTE — Progress Notes (Signed)
OT Cancellation Note  Patient Details Name: [name] MRN: [id number] DOB: 1961/11/20   Cancelled Treatment:    Reason Eval/Treat Not Completed: Pain limiting ability to participate;Fatigue/lethargy limiting ability to participate. New order received, chart reviewed. Pt POD0 s/p I&D of LLE stump. Pt reporting 6/10 pain and tearful, expressing frustration with set backs in her progress towards recovery. Pt noted she broke a tooth on a pickle. Pt denies sensitivity or tooth pain and reports she is modified the types of foods she is ordering to minimize risk of further damage. Pt reports RN has been notified of her tooth. Active listening and emotional support provided. Pt declining OT re-evaluation this afternoon, stating "It's just too much today." Pt agreeable to OT's return tomorrow, as she does state she is eager to participate and "get back to my life." Will re-attempt next date as pt is medically appropriate.   Richrd Prime, MPH, MS, OTR/L ascom [phone] 01/14/19, 2:27 PM

## 2019-01-14 NOTE — Transfer of Care (Signed)
Immediate Anesthesia Transfer of Care Note  Patient: [name]  Procedure(s) Performed: IRRIGATION AND DEBRIDEMENT EXTREMITY-LEFT BKA (Left Leg Lower) APPLICATION OF WOUND VAC (Left Knee)  Patient Location: PACU  Anesthesia Type:General  Level of Consciousness: drowsy and patient cooperative  Airway & Oxygen Therapy: Patient Spontanous Breathing and Patient connected to face mask oxygen  Post-op Assessment: Report given to RN and Post -op Vital signs reviewed and stable  Post vital signs: Reviewed and stable  Last Vitals:  Vitals Value Taken Time  BP 123/66 01/14/2019  9:05 AM  Temp    Pulse 98 01/14/2019  9:07 AM  Resp 16 01/14/2019  9:07 AM  SpO2 100 % 01/14/2019  9:07 AM  Vitals shown include unvalidated device data.  Last Pain:  Vitals:   01/14/19 0758  TempSrc: Tympanic  PainSc: 10-Worst pain ever      Patients Stated Pain Goal: 0 (01/13/19 2030)  Complications: No apparent anesthesia complications

## 2019-01-14 NOTE — Progress Notes (Signed)
[facility] - [place] at [facility]                                                                                                                                                                                  Patient Demographics   [name], is a 58 y.o. female, DOB - 11/19/1961, ZOX:[id number]  Admit date - 12/31/2018   Admitting Physician Annice Needy, MD  Outpatient Primary MD for the patient is Dortha Kern, MD   LOS - 14  Subjective: Patient underwent irrigation debridement of the left BKA stump Patient complains of pain in the leg     Review of Systems:   CONSTITUTIONAL: No documented fever. No fatigue, weakness. No weight gain, no weight loss.  EYES: No blurry or double vision.  ENT: No tinnitus. No postnasal drip. No redness of the oropharynx.  RESPIRATORY: No cough, no wheeze, no hemoptysis. No dyspnea.  CARDIOVASCULAR: No chest pain. No orthopnea. No palpitations. No syncope.  GASTROINTESTINAL: No nausea, no vomiting or diarrhea. No abdominal pain. No melena or hematochezia.  GENITOURINARY: No dysuria or hematuria.  ENDOCRINE: No polyuria or nocturia. No heat or cold intolerance.  HEMATOLOGY: No anemia. No bruising. No bleeding.  INTEGUMENTARY: No rashes. No lesions.  MUSCULOSKELETAL: No arthritis. No swelling. No gout.  NEUROLOGIC: No numbness, tingling, or ataxia. No seizure-type activity.  PSYCHIATRIC: No anxiety. No insomnia. No ADD.    Vitals:   Vitals:   01/14/19 0950 01/14/19 0951 01/14/19 1014 01/14/19 1046  BP: 129/69  106/60 124/62  Pulse: 99 99 (!) 105 100  Resp: Temp: 97.9 F (36.6 C)  98.5 F (36.9 C) 97.9 F (36.6 C)  TempSrc:   Oral Oral  SpO2: 98% 94% 99% 99%  Weight:      Height:        Wt Readings from Last 3 Encounters:  01/11/19 121.9 kg  12/17/18 119.7 kg  12/04/18 111.6 kg     Intake/Output Summary (Last 24 hours) at 01/14/2019 1140 Last data filed at 01/14/2019 0953 Gross per 24 hour  Intake  590.68 ml  Output 710 ml  Net -119.32 ml    Physical Exam:   GENERAL: Pleasant-appearing in no apparent distress.  HEAD, EYES, EARS, NOSE AND THROAT: Atraumatic, normocephalic. Extraocular muscles are intact. Pupils equal and reactive to light. Sclerae anicteric. No conjunctival injection. No oro-pharyngeal erythema.  NECK: Supple. There is no jugular venous distention. No bruits, no lymphadenopathy, no thyromegaly.  HEART: Regular rate and rhythm,. No murmurs, no rubs, no clicks.  LUNGS: Clear to auscultation bilaterally. No rales or rhonchi. No wheezes.  ABDOMEN: Soft, flat, nontender, nondistended. Has good  bowel sounds. No hepatosplenomegaly appreciated.  EXTREMITIES: No evidence of any cyanosis, clubbing, or peripheral edema.  +2 pedal and radial pulses bilaterally.  NEUROLOGIC: The patient is alert, awake, and oriented x3 with no focal motor or sensory deficits appreciated bilaterally.  SKIN: Moist and warm with no rashes appreciated.  Psych: Not anxious, depressed LN: No inguinal LN enlargement    Antibiotics   Anti-infectives (From admission, onward)   Start     Dose/Rate Route Frequency Ordered Stop   01/14/19 0815  ceFAZolin (ANCEF) IVPB 2g/100 mL premix     2 g 200 mL/hr over 30 Minutes Intravenous  Once 01/14/19 0807 01/14/19 0911   01/14/19 0758  ceFAZolin (ANCEF) 2-4 GM/100ML-% IVPB  Status:  Discontinued    Note to Pharmacy:  Lorrene ReidJackson, Pamela   : cabinet override      01/14/19 0758 01/14/19 0812   01/14/19 0757  ceFAZolin (ANCEF) 2-4 GM/100ML-% IVPB    Note to Pharmacy:  Mike CrazeHolmes, Stephen   : cabinet override      01/14/19 0757 01/14/19 1959   01/13/19 1200  meropenem (MERREM) 1 g in sodium chloride 0.9 % 100 mL IVPB     1 g 200 mL/hr over 30 Minutes Intravenous Every 8 hours 01/13/19 0946     01/12/19 1100  cefTRIAXone (ROCEPHIN) 1 g in sodium chloride 0.9 % 100 mL IVPB  Status:  Discontinued     1 g 200 mL/hr over 30 Minutes Intravenous Every 24 hours 01/12/19 0944  01/13/19 0946   01/12/19 0800  ceFEPIme (MAXIPIME) 2 g in sodium chloride 0.9 % 100 mL IVPB  Status:  Discontinued     2 g 200 mL/hr over 30 Minutes Intravenous Every 12 hours 01/11/19 1826 01/12/19 0944   01/11/19 1745  cefTRIAXone (ROCEPHIN) 1 g in sodium chloride 0.9 % 100 mL IVPB  Status:  Discontinued     1 g 200 mL/hr over 30 Minutes Intravenous Every 24 hours 01/11/19 1736 01/11/19 1741   01/11/19 1745  ceFEPIme (MAXIPIME) 2 g in sodium chloride 0.9 % 100 mL IVPB     2 g 200 mL/hr over 30 Minutes Intravenous  Once 01/11/19 1741 01/11/19 1956   01/07/19 1356  ceFAZolin (ANCEF) 2-4 GM/100ML-% IVPB    Note to Pharmacy:  Lorrene ReidJackson, Pamela   : cabinet override      01/07/19 1356 01/07/19 1426   01/07/19 0500  ceFAZolin (ANCEF) IVPB 2g/100 mL premix    Note to Pharmacy:  Send with pt to OR.   2 g 200 mL/hr over 30 Minutes Intravenous On call 01/04/19 1307 01/07/19 1456   01/02/19 0000  ceFAZolin (ANCEF) IVPB 2g/100 mL premix    Note to Pharmacy:  Send with pt to OR   2 g 200 mL/hr over 30 Minutes Intravenous On call 01/01/19 1012 01/01/19 1253   01/01/19 0500  ceFAZolin (ANCEF) IVPB 2g/100 mL premix    Note to Pharmacy:  Please send with patient to specials   2 g 200 mL/hr over 30 Minutes Intravenous On call 12/31/18 1708 01/01/19 0800   12/31/18 1600  ceFAZolin (ANCEF) IVPB 2g/100 mL premix  Status:  Discontinued     2 g 200 mL/hr over 30 Minutes Intravenous  Once 12/31/18 1547 12/31/18 1810      Medications   Scheduled Meds: . apixaban  5 mg Oral BID  . aspirin EC  81 mg Oral Daily  . atorvastatin  20 mg Oral QHS  . furosemide  20 mg Intravenous BID  .  gabapentin  1,200 mg Oral TID  . insulin aspart  0-20 Units Subcutaneous TID WC  . insulin aspart  0-5 Units Subcutaneous QHS  . insulin aspart  4 Units Subcutaneous TID WC  . insulin glargine  35 Units Subcutaneous Daily  . ketorolac  30 mg Intravenous Once  . latanoprost  1 drop Both Eyes QHS  . lisinopril  2.5 mg Oral  Daily  . metoprolol tartrate  25 mg Oral BID  . multivitamin-lutein  1 capsule Oral Daily  . nicotine  14 mg Transdermal Daily  . nortriptyline  50 mg Oral QHS  . oxybutynin  10 mg Oral QHS  . pantoprazole  20 mg Oral Daily  . pregabalin  75 mg Oral BID  . ENSURE MAX PROTEIN  11 oz Oral BID  . sodium chloride  2 g Oral BID WC  . traZODone  50 mg Oral QHS  . vitamin C  250 mg Oral BID   Continuous Infusions: . sodium chloride 0 mL/hr at 01/13/19 2112  . sodium chloride 50 mL/hr at 01/14/19 1019  . ceFAZolin    . meropenem (MERREM) IV 1 g (01/14/19 0403)   PRN Meds:.sodium chloride, acetaminophen **OR** acetaminophen, bisacodyl, clonazepam, ipratropium-albuterol, ketorolac, loperamide, morphine injection, nicotine polacrilex, ondansetron (ZOFRAN) IV, oxyCODONE, senna   Data Review:   Micro Results Recent Results (from the past 240 hour(s))  Urine Culture     Status: Abnormal   Collection Time: 01/11/19  3:23 PM  Result Value Ref Range Status   Specimen Description   Final    URINE, RANDOM Performed at [facility], [address] [place], [place] [address]    Special Requests   Final    NONE Performed at [facility], [address] [place], [place] [address]    Culture (A)  Final    >=100,000 COLONIES/mL ESCHERICHIA COLI Confirmed Extended Spectrum Beta-Lactamase Producer (ESBL).  In bloodstream infections from ESBL organisms, carbapenems are preferred over piperacillin/tazobactam. They are shown to have a lower risk of mortality.    Report Status 01/13/2019 FINAL  Final   Organism ID, Bacteria ESCHERICHIA COLI (A)  Final      Susceptibility   Escherichia coli - MIC*    AMPICILLIN >=32 RESISTANT Resistant     CEFAZOLIN >=64 RESISTANT Resistant     CEFTRIAXONE >=64 RESISTANT Resistant     CIPROFLOXACIN >=4 RESISTANT Resistant     GENTAMICIN <=1 SENSITIVE Sensitive     IMIPENEM <=0.25 SENSITIVE Sensitive     NITROFURANTOIN 128 RESISTANT Resistant      TRIMETH/SULFA <=20 SENSITIVE Sensitive     AMPICILLIN/SULBACTAM 16 INTERMEDIATE Intermediate     PIP/TAZO 64 INTERMEDIATE Intermediate     Extended ESBL POSITIVE Resistant     * >=100,000 COLONIES/mL ESCHERICHIA COLI  CULTURE, BLOOD (ROUTINE X 2) w Reflex to ID Panel     Status: None (Preliminary result)   Collection Time: 01/11/19  6:04 PM  Result Value Ref Range Status   Specimen Description BLOOD LEFTARM  Final   Special Requests   Final    BOTTLES DRAWN AEROBIC AND ANAEROBIC Blood Culture adequate volume   Culture   Final    NO GROWTH 3 DAYS Performed at [facility], [address] [place], [place] [address]    Report Status PENDING  Incomplete  CULTURE, BLOOD (ROUTINE X 2) w Reflex to ID Panel     Status: None (Preliminary result)   Collection Time: 01/11/19  6:18 PM  Result Value  Ref Range Status   Specimen Description BLOOD RAC  Final   Special Requests   Final    BOTTLES DRAWN AEROBIC AND ANAEROBIC Blood Culture adequate volume   Culture   Final    NO GROWTH 3 DAYS Performed at [facility], [address] [place], [place] [address]    Report Status PENDING  Incomplete    Radiology Reports Dg Chest 2 View  Result Date: 01/11/2019 CLINICAL DATA:  Chest pain after fall. EXAM: CHEST - 2 VIEW COMPARISON:  Radiographs of April 02, 2018. FINDINGS: Stable cardiomegaly with central pulmonary vascular congestion. No pneumothorax or pleural effusion is noted. No consolidative process is noted. The visualized skeletal structures are unremarkable. IMPRESSION: Stable cardiomegaly with central pulmonary vascular congestion. Electronically Signed   By: Lupita Raider, M.D.   On: 01/11/2019 13:05   Dg Knee 1-2 Views Left  Result Date: 01/11/2019 CLINICAL DATA:  Left knee pain after fall. EXAM: LEFT KNEE - 1-2 VIEW COMPARISON:  Radiographs of October 03, 2016. FINDINGS: Status post left below-knee amputation. Surgical staples remain. No other fracture or  dislocation is noted. Joint spaces are intact. IMPRESSION: Status post left below-knee amputation. No other fracture or bony abnormality is noted. Electronically Signed   By: Lupita Raider, M.D.   On: 01/11/2019 13:03   Dg Humerus Left  Result Date: 01/11/2019 CLINICAL DATA:  Pt reports that she had an assisted fall where she was helped to the ground by nurse and nurse aide. Pt now complaining of chest pain, left arm pain, knee pain and left hip pain. Pt had recent surgery to her left leg. EXAM: LEFT HUMERUS - 2+ VIEW COMPARISON:  None. FINDINGS: No fracture or bone lesion. Shoulder and elbow joints are normally aligned. Soft tissues are unremarkable. IMPRESSION: Negative. Electronically Signed   By: Amie Portland M.D.   On: 01/11/2019 13:01   Dg Foot Complete Left  Result Date: 12/17/2018 CLINICAL DATA:  Nonhealing wound at LEFT heel for 3 weeks, diabetes mellitus EXAM: LEFT FOOT - COMPLETE 3+ VIEW COMPARISON:  None FINDINGS: Osseous demineralization. Joint spaces preserved. No acute fracture, dislocation, or bone destruction. Dressing artifacts overlie heel and hindfoot. Small vessel vascular calcifications consistent with history of diabetes mellitus. IMPRESSION: [place] mineralization without acute bony abnormalities. Electronically Signed   By: Ulyses Southward M.D.   On: 12/17/2018 10:01   Dg Hip Unilat With Pelvis 2-3 Views Left  Result Date: 01/11/2019 CLINICAL DATA:  Left hip pain after fall. EXAM: DG HIP (WITH OR WITHOUT PELVIS) 2-3V LEFT COMPARISON:  None. FINDINGS: There is no evidence of hip fracture or dislocation. There is no evidence of arthropathy or other focal bone abnormality. IMPRESSION: Negative. Electronically Signed   By: Lupita Raider, M.D.   On: 01/11/2019 13:02   Vas [place] Lower Extremity Arterial Duplex  Result Date: 01/01/2019 LOWER EXTREMITY ARTERIAL DUPLEX STUDY Indications: 12/23/2019Recent PTA , thrombolytic therapy, and stent placement in              the SFA to pop art.   Current ABI: unable to obtain Comparison Study: 11/26/2018 Performing Technologist: Salvadore Farber RVT  Examination Guidelines: A complete evaluation includes B-mode imaging, spectral Doppler, color Doppler, and power Doppler as needed of all accessible portions of each vessel. Bilateral testing is considered an integral part of a complete examination. Limited examinations for reoccurring indications may be performed as noted.  Left Duplex Findings: +----------+--------+-----+--------+---------+--------+           PSV  cm/sRatioStenosisWaveform Comments +----------+--------+-----+--------+---------+--------+ CFA Mid   79                   biphasic          +----------+--------+-----+--------+---------+--------+ DFA       80                   triphasic         +----------+--------+-----+--------+---------+--------+ SFA Prox               occluded         stent    +----------+--------+-----+--------+---------+--------+ SFA Mid                occluded         stent    +----------+--------+-----+--------+---------+--------+ SFA Distal             occluded         stent    +----------+--------+-----+--------+---------+--------+ POP Distal             occluded                  +----------+--------+-----+--------+---------+--------+ ATA Distal             occluded                  +----------+--------+-----+--------+---------+--------+ PTA Distal             occluded                  +----------+--------+-----+--------+---------+--------+  Summary: Left: Patent CFA and deep profunda. Occluded SFA and stent from origin distally. No arterial detected in the SFA, stent, popliteal, posterior tibial, and anterior tibial arteries.  See table(s) above for measurements and observations. Electronically signed by Festus Barren MD on 01/01/2019 at 3:05:19 PM.    Final      CBC Recent Labs  Lab 01/09/19 0354 01/11/19 0747 01/12/19 0444 01/13/19 0355 01/14/19 0521  WBC  11.3* 14.0* 10.3 7.9 7.6  HGB 7.5* 7.0* 8.0* 8.0* 7.8*  HCT 25.4* 23.6* 26.2* 27.0* 26.1*  PLT 311 255 270 278 291  MCV 88.8 88.4 86.2 88.2 87.6  MCH 26.2 26.2 26.3 26.1 26.2  MCHC 29.5* 29.7* 30.5 29.6* 29.9*  RDW 18.6* 19.4* 18.7* 18.8* 18.6*    Chemistries  Recent Labs  Lab 01/09/19 0354 01/11/19 0747 01/12/19 0444 01/13/19 0355 01/14/19 0521  NA 134* 134* 132* 133* 134*  K 4.1 4.5 4.4 3.8 3.7  CL 99 100 98 98 100  CO2 29 28 27 28 29   GLUCOSE 111* 152* 161* 131* 152*  BUN 15 18 16 17 19   CREATININE 0.87 0.85 0.77 0.82 0.78  CALCIUM 8.6* 8.5* 8.3* 8.1* 8.0*  MG 1.7 1.3* 2.1 1.6* 1.7   ------------------------------------------------------------------------------------------------------------------ estimated creatinine clearance is 99.4 mL/min (by C-G formula based on SCr of 0.78 mg/dL). ------------------------------------------------------------------------------------------------------------------ No results for input(s): HGBA1C in the last 72 hours. ------------------------------------------------------------------------------------------------------------------ No results for input(s): CHOL, HDL, LDLCALC, TRIG, CHOLHDL, LDLDIRECT in the last 72 hours. ------------------------------------------------------------------------------------------------------------------ No results for input(s): TSH, T4TOTAL, T3FREE, THYROIDAB in the last 72 hours.  Invalid input(s): FREET3 ------------------------------------------------------------------------------------------------------------------ No results for input(s): VITAMINB12, FOLATE, FERRITIN, TIBC, IRON, RETICCTPCT in the last 72 hours.  Coagulation profile Recent Labs  Lab 01/11/19 2246  INR 1.41    No results for input(s): DDIMER in the last 72 hours.  Cardiac Enzymes Recent Labs  Lab 01/11/19 1438 01/11/19 2246 01/12/19 0444  TROPONINI <0.03 0.03* 0.03*    ------------------------------------------------------------------------------------------------------------------ Invalid input(s):  POCBNP    Assessment & Plan   Acute respiratory failure with hypoxia Is related to acute diastolic CHF Now patient's respiratory status is normalized on room air  Acute diastolic CHF. Continue to oral Lasix  Sepsis due to UTI with ESBL producing E. Coli Treat with IV meropenem, patient will need total of 7 days of meropenem.  On discharge she can be discharged on IM meropenem daily at the facility to complete a 7-day course  Anemia of chronic disease and acute blood loss possible due to surgery. Status post 1 unit of packed RBCs earlier in the hospitalization Hemoglobin stable, repeat CBC in the morning  Diabetes.  Continue sliding scale, NovoLog 3 times daily and Lantus.  Hold metformin.  PVD, s/p left BKA.  Managed by vascular surgeon.  Continue Eliquis and Lipitor.  Further antibiotics per surgery  Morbid obesity.  Diet control and follow-up PCP.  Tobacco abuse.    Smoking cessation was provided      Code Status Orders  (From admission, onward)         Start     Ordered   12/31/18 1701  Full code  Continuous     12/31/18 1704        Code Status History    Date Active Date Inactive Code Status Order ID Comments User Context   12/17/2018 1321 12/17/2018 1805 Full Code [id number]  Annice Needyew, Jason S, MD Inpatient   04/02/2018 2241 04/04/2018 1837 Full Code [id number]  Altamese DillingVachhani, Vaibhavkumar, MD Inpatient   04/23/2016 1834 05/02/2016 2018 Full Code [id number]  Katha HammingKonidena, Snehalatha, MD ED               Lab Results  Component Value Date   PLT 291 01/14/2019     Time Spent in minutes  35min  Greater than 50% of time spent in care coordination and counseling patient regarding the condition and plan of care.   Auburn BilberryShreyang Analayah Brooke M.D on 01/14/2019 at 11:40 AM  Between 7am to 6pm - Pager - [phone]  After 6pm go to [email or web address] -  Social research officer, [email or web address] [email or web address]  [facility]   Office  [phone]

## 2019-01-14 NOTE — Clinical Social Work Note (Signed)
CSW spoke with patient and mother at bedside. CSW explained that patient will need an updated authorization for SNF. CSW explained that she would send updated information to insurance company and start a new authorization. Mother asked that a referral be sent to [facility] and [facility] again. Per mother, she has already spoken to both facilities and they are expecting a referral. CSW will resend referral to both requested facilities and give bed offers as available. CSW will continue to follow for discharge planning.   Ruthe Mannan MSW, [address] [phone]

## 2019-01-15 ENCOUNTER — Ambulatory Visit: Payer: Medicare HMO

## 2019-01-15 LAB — GLUCOSE, CAPILLARY
Glucose-Capillary: 196 mg/dL — ABNORMAL HIGH (ref 70–99)
Glucose-Capillary: 145 mg/dL — ABNORMAL HIGH (ref 70–99)
Glucose-Capillary: 250 mg/dL — ABNORMAL HIGH (ref 70–99)
Glucose-Capillary: 93 mg/dL (ref 70–99)

## 2019-01-15 LAB — CBC
HCT: 27.9 % — ABNORMAL LOW (ref 36.0–46.0)
Hemoglobin: 8.1 g/dL — ABNORMAL LOW (ref 12.0–15.0)
MCH: 26.3 pg (ref 26.0–34.0)
MCHC: 29 g/dL — ABNORMAL LOW (ref 30.0–36.0)
MCV: 90.6 fL (ref 80.0–100.0)
Platelets: 352 10*3/uL (ref 150–400)
RBC: 3.08 MIL/uL — ABNORMAL LOW (ref 3.87–5.11)
RDW: 18.7 % — ABNORMAL HIGH (ref 11.5–15.5)
WBC: 8.5 10*3/uL (ref 4.0–10.5)
nRBC: 0 % (ref 0.0–0.2)

## 2019-01-15 LAB — BASIC METABOLIC PANEL
Anion gap: 6 (ref 5–15)
BUN: 20 mg/dL (ref 6–20)
CO2: 30 mmol/L (ref 22–32)
CREATININE: 0.97 mg/dL (ref 0.44–1.00)
Calcium: 8.5 mg/dL — ABNORMAL LOW (ref 8.9–10.3)
Chloride: 96 mmol/L — ABNORMAL LOW (ref 98–111)
GFR calc Af Amer: >60 mL/min (ref >60)
GFR calc non Af Amer: >60 mL/min (ref >60)
GLUCOSE: 228 mg/dL — AB (ref 70–99)
Potassium: 4.8 mmol/L (ref 3.5–5.1)
Sodium: 132 mmol/L — ABNORMAL LOW (ref 135–145)

## 2019-01-15 LAB — MAGNESIUM: Magnesium: 2 mg/dL (ref 1.7–2.4)

## 2019-01-15 MED ORDER — HYDROMORPHONE HCL 1 MG/ML IJ SOLN
1.0000 mg | INTRAMUSCULAR | Status: AC | PRN
  Administered 2019-01-15 – 2019-01-16 (×3): 1 mg via INTRAVENOUS
  Filled 2019-01-15 (×4): qty 1

## 2019-01-15 NOTE — Progress Notes (Signed)
Physical Therapy Treatment Patient Details Name: [name] MRN: [id number] DOB: 1961/03/18 Today's Date: 01/15/2019    History of Present Illness 58 yo female with PMHx including PVD, anxiety, GERD, DM, HTN, CRF s/p extensive LLE revascularization during this hospitalization. Angiogram showed minimal flow through the calf even after angioplasty and thrombectomy.  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    PT Comments    Pt is s/p L stump I&D yesterday, still in a lot of pain but eager to work with PT.  Pt was able to stand X 2 at EOB >1 minute with +2 assist (mod/max to get to standing, min +2 to maintain balance) with heavy UE use on walker.  Pt fatigued quickly with these with HR increasing to 130s with each attempt and need for rest break following.  She showed great effort but clearly is weak in R LE as knee buckled on first standing attempt.  She showed very good effort with b/l LE bed exercises, educated pt on appropriate positioning, HEP performance and generally about expectations and progression with new BKA/rehab/etc.  Pt needing some cuing to remain on task, but again very eager to do what she can despite being limited and very weak.   Follow Up Recommendations  SNF     Equipment Recommendations  Other (comment)(TBD at next venue of care, will likely need WC)    Recommendations for Other Services       Precautions / Restrictions Precautions Precautions: Fall Restrictions Weight Bearing Restrictions: Yes RLE Weight Bearing: Weight bearing as tolerated LLE Weight Bearing: Non weight bearing Other Position/Activity Restrictions: s/p L BKA    Mobility  Bed Mobility Overal bed mobility: Modified Independent Bed Mobility: Supine to Sit;Sit to Supine     Supine to sit: Supervision Sit to supine: Supervision   General bed mobility comments: Pt did well with getting to/from EOB, no assist needed  Transfers Overall  transfer level: Needs assistance Equipment used: Rolling walker (2 wheeled) Transfers: Sit to/from Stand Sit to Stand: Mod assist;+2 physical assistance;Max assist         General transfer comment: 2 times standing.  Pt showed great effort with each bout, however on first attempt she initiated rise to standing and had buckling in the R knee needing +2 max assist to get to upright.  Mod assist on second attempt.   Pt stood ~70 seconds each time with heavy UE use of walker, great effort and much fatigue.  HR in the 130s after each effort and needing to sit down quickly secondary to fatigue.   Ambulation/Gait             General Gait Details: attempted one small heel-toe side shift at EOB, pt unable to maintain balance    Stairs             Wheelchair Mobility    Modified Rankin (Stroke Patients Only)       Balance Overall balance assessment: Needs assistance   Sitting balance-Leahy Scale: Good Sitting balance - Comments: able to maintain long sitting and EOB sitting w/o issue   Standing balance support: Bilateral upper extremity supported;During functional activity Standing balance-Leahy Scale: Poor Standing balance comment: heavy reliance on UE/walker and close +2 assist secondary to R LE weakness                            Cognition Arousal/Alertness: Awake/alert Behavior  During Therapy: WFL for tasks assessed/performed Overall Cognitive Status: Within Functional Limits for tasks assessed                                        Exercises General Exercises - Lower Extremity Ankle Circles/Pumps: 15 reps;Right(resisted PF, AAROM DF) Quad Sets: Strengthening;15 reps;Both Gluteal Sets: Strengthening;15 reps;Both Short Arc Quad: Strengthening;10 reps;Both Heel Slides: Strengthening;10 reps;Right(with resisted leg extension) Hip ABduction/ADduction: Strengthening;10 reps;Both Straight Leg Raises: AROM;10 reps;Both    General Comments         Pertinent Vitals/Pain Pain Assessment: 0-10 Pain Score: 7  Pain Location: Lt residual limb, reports 9/10 after activity    Home Living                      Prior Function            PT Goals (current goals can now be found in the care plan section) Progress towards PT goals: Progressing toward goals    Frequency    7X/week      PT Plan Current plan remains appropriate    Co-evaluation              AM-PAC PT "6 Clicks" Mobility   Outcome Measure  Help needed turning from your back to your side while in a flat bed without using bedrails?: None Help needed moving from lying on your back to sitting on the side of a flat bed without using bedrails?: None Help needed moving to and from a bed to a chair (including a wheelchair)?: Total Help needed standing up from a chair using your arms (e.g., wheelchair or bedside chair)?: A Lot Help needed to walk in hospital room?: Total Help needed climbing 3-5 steps with a railing? : Total 6 Click Score: 13    End of Session Equipment Utilized During Treatment: Gait belt Activity Tolerance: Patient limited by pain;Patient limited by fatigue Patient left: with call bell/phone within reach;in bed;with bed alarm set;with SCD's reapplied Nurse Communication: Mobility status PT Visit Diagnosis: Pain;Unsteadiness on feet (R26.81);Other abnormalities of gait and mobility (R26.89);Difficulty in walking, not elsewhere classified (R26.2);Muscle weakness (generalized) (M62.81) Pain - Right/Left: Left Pain - part of body: Leg     Time: 8295-6213 PT Time Calculation (min) (ACUTE ONLY): 64 min  Charges:  $Therapeutic Exercise: 23-37 mins $Therapeutic Activity: 23-37 mins                     Malachi Pro, DPT 01/15/2019, 4:28 PM

## 2019-01-15 NOTE — Progress Notes (Signed)
[facility] [place] [facility]   PATIENT NAME: [name]    MR#:  [id number]  DATE OF BIRTH:  May 20, 1961  SUBJECTIVE:   patient reports that she is having pain at her stump.  REVIEW OF SYSTEMS:    Review of Systems  Constitutional: Negative for fever, chills weight loss HENT: Negative for ear pain, nosebleeds, congestion, facial swelling, rhinorrhea, neck pain, neck stiffness and ear discharge.   Respiratory: Negative for cough, shortness of breath, wheezing  Cardiovascular: Negative for chest pain, palpitations and leg swelling.  Gastrointestinal: Negative for heartburn, abdominal pain, vomiting, diarrhea or consitpation Genitourinary: Negative for dysuria, urgency, frequency, hematuria Musculoskeletal: Negative for back pain or joint pain Stump Pain Neurological: Negative for dizziness, seizures, syncope, focal weakness,  numbness and headaches.  Hematological: Does not bruise/bleed easily.  Psychiatric/Behavioral: Negative for hallucinations, confusion, dysphoric mood    Tolerating Diet: yes      DRUG ALLERGIES:   Allergies  Allergen Reactions  . Biaxin [Clarithromycin] Rash    Patient states this medication gives her severe rash and thrush in the mouth.    VITALS:  Blood pressure 109/65, pulse 96, temperature 98.4 F (36.9 C), temperature source Oral, resp. rate 16, height 5\' 3"  (1.6 m), weight 122.9 kg, SpO2 100 %.  PHYSICAL EXAMINATION:  Constitutional: Appears well-developed and well-nourished. No distress. HENT: Normocephalic. Marland Kitchen Oropharynx is clear and moist.  Eyes: Conjunctivae and EOM are normal. PERRLA, no scleral icterus.  Neck: Normal ROM. Neck supple. No JVD. No tracheal deviation. CVS: RRR, S1/S2 +, no murmurs, no gallops, no carotid bruit.  Pulmonary: Effort and breath sounds normal, no stridor, rhonchi, wheezes, rales.  Abdominal: Soft. BS +,  no distension, tenderness, rebound or guarding.  Musculoskeletal:left BKA   Neuro:  Alert. CN 2-12 grossly intact. No focal deficits. Skin: Skin is warm and dry. No rash noted. Psychiatric: Normal mood and affect.      LABORATORY PANEL:   CBC Recent Labs  Lab 01/15/19 0414  WBC 8.5  HGB 8.1*  HCT 27.9*  PLT 352   ------------------------------------------------------------------------------------------------------------------  Chemistries  Recent Labs  Lab 01/15/19 0414  NA 132*  K 4.8  CL 96*  CO2 30  GLUCOSE 228*  BUN 20  CREATININE 0.97  CALCIUM 8.5*  MG 2.0   ------------------------------------------------------------------------------------------------------------------  Cardiac Enzymes Recent Labs  Lab 01/11/19 1438 01/11/19 2246 01/12/19 0444  TROPONINI <0.03 0.03* 0.03*   ------------------------------------------------------------------------------------------------------------------  RADIOLOGY:  No results found.   ASSESSMENT AND PLAN:    58 year old female with history of diabetes and peripheral vascular disease who presented to the emergency room due to shortness of breath.  1.  Acute hypoxic respiratory failure due to acute on chronic diastolic heart failure: Patient's respiratory status is stable.  2.  Acute on chronic diastolic heart failure: Patient is euvolemic.  Continue oral Lasix.  3.  Sepsis due to ESBL producing E. coli: Patient needs total of 5 days of meropenem.  4.  Acute on chronic anemia: Patient is status post PRBC and hemoglobin remains relatively stable.  5.  Diabetes: Continue current regimen with ADA diet  6.  Peripheral vascular disease status post left BKA: Plan is per vascular surgery   I will sign off.  I will be here for the rest of the week if you have any questions please page me.  Thank you for allowing [place] to participate in the care of the patient.      Management plans discussed with the patient and she  is in agreement.  CODE STATUS: FULL  TOTAL TIME TAKING CARE OF THIS PATIENT:  .     POSSIBLE D/C 1-2 days, DEPENDING ON CLINICAL CONDITION.   Tkai Serfass M.D on 01/15/2019 at 11:20 AM  Between 7am to 6pm - Pager - [phone] After 6pm go to [email or web address] - password [email or web address]  [facility]  Office  [phone]  CC: Primary care physician; Dortha Kern, MD  Note: This dictation was prepared with Dragon dictation along with smaller phrase technology. Any transcriptional errors that result from this process are unintentional.

## 2019-01-15 NOTE — Progress Notes (Signed)
[facility] Daily Progress Note   Subjective: 15Days Post-Op:Ultrasound guidance for vascular accessrightfemoral artery,Catheter placement into left common femoral artery from right femoral approach, Selectiveleftlower extremity angiogram,Placement of 8 mg of TPA with an infusion catheter to the left SFA and popliteal arteries,Placement of infusion catheter in left SFA, popliteal artery, and proximal posterior tibial artery for continuous infusion of TPA overnight,Placement of a right femoral venous triple-lumen catheter with ultrasound and fluoroscopy guidance  14Days Post-Op:Angiogram through existing catheter left lower extremity,Catheter placement into left posterior tibial artery from right femoral approach,Mechanical thrombectomy to the left SFA, popliteal artery, tibioperoneal trunk, and posterior tibial arteries with the penumbra cat 6 device,Percutaneous transluminal angioplasty ofleft posterior tibial artery and tibioperoneal trunk with 3 mm diameter by 30 cm length angioplasty balloonwithStarClose closure device rightfemoral artery  14Days Post-Op:Left leg / calf fasciotomies  8Days Post-Op:Left below-the-knee amputation   1 Day Post-Op: Irrigation and debridement of left BKA stump for 30cm2 to skin and soft tissue, pulse lavage irrigation, and VAC dressing placement  No issues overnight. Patient understands we will treat post-op pain with two days of Dilaudid and then switch back to morphine as needed for breakthrough. Patient understands her first VAC change will be Thursday. She also understands we are planning for dispo come Friday to finish recommended IV ABX course.  Objective: Vitals:   01/14/19 2016 01/14/19 2212 01/15/19 0449 01/15/19 1151  BP: 116/60 132/78 109/65   Pulse: 100 (!) 102 96 (!) 108  Resp: 18  16   Temp: 97.8 F (36.6 C)  98.4 F (36.9 C)   TempSrc: Oral  Oral   SpO2: 96%  100% 98%  Weight:   122.9 kg    Height:        Intake/Output Summary (Last 24 hours) at 01/15/2019 1306 Last data filed at 01/15/2019 1133 Gross per 24 hour  Intake 392.84 ml  Output 1000 ml  Net -607.16 ml   Physical Exam: A&Ox3, NAD CV: RRR Pulmonary: CTA Bilaterally Abdomen: Soft, Nontender, Nondistended Vascular:  Right Lower Extremity: VAC intact and to suction. Thigh soft. Calf soft.    Laboratory: CBC    Component Value Date/Time   WBC 8.5 01/15/2019 0414   HGB 8.1 (L) 01/15/2019 0414   HCT 27.9 (L) 01/15/2019 0414   PLT 352 01/15/2019 0414   BMET    Component Value Date/Time   NA 132 (L) 01/15/2019 0414   K 4.8 01/15/2019 0414   CL 96 (L) 01/15/2019 0414   CO2 30 01/15/2019 0414   GLUCOSE 228 (H) 01/15/2019 0414   BUN 20 01/15/2019 0414   CREATININE 0.97 01/15/2019 0414   CALCIUM 8.5 (L) 01/15/2019 0414   GFRNONAA >60 01/15/2019 0414   GFRAA >60 01/15/2019 0414   Assessment/Planning: The patient is a 58 year old female with multiple medical issues continued ongoing tobacco abuse with a known history of severe peripheral artery disease to the left lower extremity status post endovascular intervention, left below the knee amputation and now debridement with VAC placement of the left BKA 1) Medicine has signed off. Sepsis due to ESBL producing E. coli: Patient needs total of 5 days of meropenem. Course will be finished Friday. 2) Fist VAC change is planned for Thursday. 3) H&H stable. Creatinine stable.  4) Two days of dilaudid for breakthrough pain due to increased pain from take back to OR, will transition back to morphine breakthrough in 48 hours. Added Toradol for pain control.  5) Encouraged patient to work with PT /  OT 6) Pharmacy following for electrolytes  Discussed with Dr. Wallis Martew  Deakon Frix PA-C 01/15/2019 1:06 PM

## 2019-01-15 NOTE — Consult Note (Signed)
PHARMACY CONSULT NOTE - FOLLOW UP  Pharmacy Consult for Electrolyte Monitoring and Replacement   Recent Labs: Potassium (mmol/L)  Date Value  01/15/2019 4.8   Magnesium (mg/dL)  Date Value  00/76/2263 2.0   Calcium (mg/dL)  Date Value  33/54/5625 8.5 (L)   Albumin (g/dL)  Date Value  63/89/3734 1.2 (L)   Phosphorus (mg/dL)  Date Value  28/76/8115 3.4   Sodium (mmol/L)  Date Value  01/15/2019 132 (L)   Correct Calcium: 10.8  Assessment: Pharmacy consulted for electrolyte monitoring and replacement in 58 yo female admitted with LLE pain and ulceration. Patient had hypomagnesemia and hypokalemia on admission.   Goal of Therapy:  Electrolytes WNL   Plan:  K 4.8  Mag 2.0  Scr 0.78  Patient now on lasix 20 mg IV bid and Sodium chloride 2 gm tabs BID. Will check BMP with AM labs.  Orinda Kenner, PharmD Clinical Pharmacist 01/15/2019 7:17 AM

## 2019-01-15 NOTE — Progress Notes (Signed)
Occupational Therapy Treatment Patient Details Name: [name] L Rozzell MRN: [id number] DOB: 11-22-1961 Today's Date: 01/15/2019    History of present illness Pt is a 58 yo female with PMHx including PVD, anxiety, GERD, DM, HTN, CRF s/p extensive LLE revascularization during this hospitalization. Angiogram shows minimal flow through the calf even after angioplasty and thrombectomy. Concern for compartment syndrome and pt now s/p LLE fasciotomies (01/01/2019).  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 S/p I&D of L stump on 01/14/2019.   OT comments  Pt seen for OT tx this date. Pt now s/p L residual limb I&D with woundvac. Pt reporting 8/10. No significant functional change since this procedure. POC reviewed and updated. Pt participated in functional bed mobility training for seated ADL tasks. Pt performed rolling and sup<>sit with supervision and additional time/effort with L stump pain. HR 101-108 w/ exertion, O2 sats >98% on RA t/o session. Pt participated in rolling for bed pan toileting task, able to perform hygiene with set up while sidelying. Pt sat EOB >5 min to perform grooming tasks with set up. Pt continues to benefit from skilled OT services in order to maximize return to PLOF, minimize risk of falls and minimize caregiver burden. Continue to recommend STR upon discharge.   Follow Up Recommendations  SNF    Equipment Recommendations  Other (comment)(TBD)    Recommendations for Other Services      Precautions / Restrictions Precautions Precautions: Fall Restrictions Weight Bearing Restrictions: Yes RLE Weight Bearing: Weight bearing as tolerated LLE Weight Bearing: Non weight bearing Other Position/Activity Restrictions: s/p L BKA       Mobility Bed Mobility Overal bed mobility: Needs Assistance Bed Mobility: Supine to Sit;Sit to Supine;Rolling Rolling: Supervision   Supine to sit: Supervision Sit to supine: Supervision   General bed mobility comments: increased time/effort to perform with HOB  elevated, slightly pain limited with L residual limb  Transfers                      Balance Overall balance assessment: Needs assistance Sitting-balance support: No upper extremity supported;Feet supported Sitting balance-Leahy Scale: Good                                     ADL either performed or assessed with clinical judgement   ADL Overall ADL's : Needs assistance/impaired     Grooming: Sitting;Independent Grooming Details (indicate cue type and reason): pt performed grooming tasks seated EOB with set up, good sitting balance                               General ADL Comments: Pt continues to require MIN-MOD A for LB ADL from a seated/lateral lean position     Vision Patient Visual Report: No change from baseline     Perception     Praxis      Cognition Arousal/Alertness: Awake/alert Behavior During Therapy: WFL for tasks assessed/performed Overall Cognitive Status: Within Functional Limits for tasks assessed                                          Exercises Other Exercises Other Exercises: functional bed mobility training   Shoulder Instructions       General Comments      Pertinent  Vitals/ Pain       Pain Assessment: 0-10 Pain Score: 8  Pain Location: Lt residual limb Pain Descriptors / Indicators: Grimacing;Guarding;Aching Pain Intervention(s): Limited activity within patient's tolerance;Monitored during session;Premedicated before session;Repositioned  Home Living                                          Prior Functioning/Environment              Frequency  Min 2X/week        Progress Toward Goals  OT Goals(current goals can now be found in the care plan section)  Progress towards OT goals: Progressing toward goals  Acute Rehab OT Goals Patient Stated Goal: to improve independence OT Goal Formulation: With patient Time For Goal Achievement:  01/29/19 Potential to Achieve Goals: Good ADL Goals Pt Will Transfer to Toilet: with min assist;with +2 assist;bedside commode;stand pivot transfer(LRAD for amb)  Plan Discharge plan remains appropriate;Frequency remains appropriate    Co-evaluation                 AM-PAC OT "6 Clicks" Daily Activity     Outcome Measure   Help from another person eating meals?: None Help from another person taking care of personal grooming?: None Help from another person toileting, which includes using toliet, bedpan, or urinal?: A Lot Help from another person bathing (including washing, rinsing, drying)?: A Lot Help from another person to put on and taking off regular upper body clothing?: None Help from another person to put on and taking off regular lower body clothing?: A Lot 6 Click Score: 18    End of Session    OT Visit Diagnosis: Other abnormalities of gait and mobility (R26.89);Pain;Muscle weakness (generalized) (M62.81) Pain - Right/Left: Left Pain - part of body: Leg   Activity Tolerance Patient tolerated treatment well   Patient Left in bed;with call bell/phone within reach   Nurse Communication          Time: 7473-4037 OT Time Calculation (min): 49 min  Charges: OT General Charges $OT Visit: 1 Visit OT Treatments $Self Care/Home Management : 38-52 mins  Richrd Prime, MPH, MS, OTR/L ascom [phone] 01/15/19, 12:01 PM

## 2019-01-16 LAB — CULTURE, BLOOD (ROUTINE X 2)
Culture: NO GROWTH
Culture: NO GROWTH
SPECIAL REQUESTS: ADEQUATE
Special Requests: ADEQUATE

## 2019-01-16 LAB — BASIC METABOLIC PANEL
Anion gap: 5 (ref 5–15)
BUN: 21 mg/dL — ABNORMAL HIGH (ref 6–20)
CO2: 30 mmol/L (ref 22–32)
Calcium: 8.5 mg/dL — ABNORMAL LOW (ref 8.9–10.3)
Chloride: 100 mmol/L (ref 98–111)
Creatinine, Ser: 0.78 mg/dL (ref 0.44–1.00)
GFR calc non Af Amer: >60 mL/min (ref >60)
Glucose, Bld: 138 mg/dL — ABNORMAL HIGH (ref 70–99)
Potassium: 4.3 mmol/L (ref 3.5–5.1)
Sodium: 135 mmol/L (ref 135–145)

## 2019-01-16 LAB — CBC
HCT: 26.6 % — ABNORMAL LOW (ref 36.0–46.0)
Hemoglobin: 8 g/dL — ABNORMAL LOW (ref 12.0–15.0)
MCH: 26.1 pg (ref 26.0–34.0)
MCHC: 30.1 g/dL (ref 30.0–36.0)
MCV: 86.9 fL (ref 80.0–100.0)
Platelets: 353 10*3/uL (ref 150–400)
RBC: 3.06 MIL/uL — ABNORMAL LOW (ref 3.87–5.11)
RDW: 18.7 % — ABNORMAL HIGH (ref 11.5–15.5)
WBC: 7.3 10*3/uL (ref 4.0–10.5)
nRBC: 0 % (ref 0.0–0.2)

## 2019-01-16 LAB — GLUCOSE, CAPILLARY
GLUCOSE-CAPILLARY: 180 mg/dL — AB (ref 70–99)
Glucose-Capillary: 177 mg/dL — ABNORMAL HIGH (ref 70–99)
Glucose-Capillary: 181 mg/dL — ABNORMAL HIGH (ref 70–99)
Glucose-Capillary: 158 mg/dL — ABNORMAL HIGH (ref 70–99)
Glucose-Capillary: 199 mg/dL — ABNORMAL HIGH (ref 70–99)
Glucose-Capillary: 188 mg/dL — ABNORMAL HIGH (ref 70–99)

## 2019-01-16 LAB — MAGNESIUM: Magnesium: 1.6 mg/dL — ABNORMAL LOW (ref 1.7–2.4)

## 2019-01-16 MED ORDER — MAGNESIUM SULFATE 4 GM/100ML IV SOLN
4.0000 g | Freq: Once | INTRAVENOUS | Status: AC
  Administered 2019-01-16: 4 g via INTRAVENOUS
  Filled 2019-01-16 (×2): qty 100

## 2019-01-16 NOTE — Clinical Social Work Note (Signed)
CSW spoke to patient regarding discharge plan. CSW explained that there are no other bed offers available at this time and currently the only offers are still [facility], [facility] and [facility]. Patient states that she would like to continue with plan to discharge to [facility]. CSW explained that new insurance authorization is needed and would be started today. Patient in agreement with discharge plan to [facility]. CSW started new authorization with [facility]. CSW will continue to follow for transitions of care.   Ruthe Mannan MSW, [address] [phone]

## 2019-01-16 NOTE — Consult Note (Signed)
PHARMACY CONSULT NOTE - FOLLOW UP  Pharmacy Consult for Electrolyte Monitoring and Replacement   Recent Labs: Potassium (mmol/L)  Date Value  01/16/2019 4.3   Magnesium (mg/dL)  Date Value  10/15/1172 1.6 (L)   Calcium (mg/dL)  Date Value  56/70/1410 8.5 (L)   Albumin (g/dL)  Date Value  30/13/1438 1.2 (L)   Phosphorus (mg/dL)  Date Value  88/75/7972 3.4   Sodium (mmol/L)  Date Value  01/16/2019 135   Correct Calcium: 10.8  Assessment: Pharmacy consulted for electrolyte monitoring and replacement in 58 yo female admitted with LLE pain and ulceration. Patient had hypomagnesemia and hypokalemia on admission.   Goal of Therapy:  Electrolytes WNL   Plan:  K 4.3  Mag 1.6  Scr 0.78  Patient now on lasix 20 mg IV bid and Sodium chloride 2 gm tabs BID. Will order magnesium sulf 4 gm IV once. Will check BMP with AM labs.  Orinda Kenner, PharmD Clinical Pharmacist 01/16/2019 7:10 AM

## 2019-01-16 NOTE — Progress Notes (Signed)
Physical Therapy Treatment Patient Details Name: [name] MRN: [id number] DOB: 12-05-1961 Today's Date: 01/16/2019    History of Present Illness 58 yo female with PMHx including PVD, anxiety, GERD, DM, HTN, CRF s/p extensive LLE revascularization during this hospitalization. Angiogram showed minimal flow through the calf even after angioplasty and thrombectomy.  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    PT Comments    Pt agreeable to PT for exercises only today. Pt feeling anxious due to shortness of breath. O2 saturation checked previously by nursing and again during session; currently 99%. Educated pt on rest between activity/execise and pursed lip breathing. Pt performs exercises supine and seated with some improvement in breathing and anxiety, but can easily become winded/anxious with sit to supine and exercise activity demonstrating heavy mouth breathing. Reminded and encouraged of pursed lip breathing. Continue PT to progress endurance and strength to improve all functional mobility.   Follow Up Recommendations  SNF     Equipment Recommendations       Recommendations for Other Services       Precautions / Restrictions Precautions Precautions: Fall Restrictions Weight Bearing Restrictions: Yes RLE Weight Bearing: Weight bearing as tolerated LLE Weight Bearing: Non weight bearing Other Position/Activity Restrictions: s/p L BKA    Mobility  Bed Mobility Overal bed mobility: Modified Independent       Supine to sit: Modified independent (Device/Increase time) Sit to supine: Modified independent (Device/Increase time)   General bed mobility comments: Increased time with pauses and use of rails  Transfers                 General transfer comment: declines today due to feeling SOB  Ambulation/Gait                 Stairs             Wheelchair Mobility    Modified Rankin (Stroke Patients  Only)       Balance                                            Cognition Arousal/Alertness: Awake/alert Behavior During Therapy: Anxious Overall Cognitive Status: Within Functional Limits for tasks assessed                                        Exercises General Exercises - Lower Extremity Ankle Circles/Pumps: AROM;Right;20 reps;Seated(limited range) Long Arc Quad: AROM;Right;10 reps;Seated(2 sets) Hip ABduction/ADduction: AROM;Both;10 reps;Seated;Strengthening(2 sets) Straight Leg Raises: AROM;Strengthening;Both;10 reps;Supine(semi reclined; 2 sets) Hip Flexion/Marching: AAROM;Strengthening;Both;10 reps;Seated(2 sets) Other Exercises Other Exercises: pursed lip breathing Other Exercises: hip isometric ext L 2 x 10    General Comments        Pertinent Vitals/Pain Pain Assessment: No/denies pain Pain Intervention(s): Premedicated before session    Home Living                      Prior Function            PT Goals (current goals can now be found in the care plan section) Progress towards PT goals: Progressing toward goals    Frequency    7X/week      PT Plan Current plan remains appropriate  Co-evaluation              AM-PAC PT "6 Clicks" Mobility   Outcome Measure  Help needed turning from your back to your side while in a flat bed without using bedrails?: None Help needed moving from lying on your back to sitting on the side of a flat bed without using bedrails?: None Help needed moving to and from a bed to a chair (including a wheelchair)?: A Lot Help needed standing up from a chair using your arms (e.g., wheelchair or bedside chair)?: A Lot Help needed to walk in hospital room?: Total Help needed climbing 3-5 steps with a railing? : Total 6 Click Score: 14    End of Session Equipment Utilized During Treatment: Oxygen Activity Tolerance: Other (comment)(limited by anxiety due to SOB) Patient  left: in bed;with call bell/phone within reach;with bed alarm set   PT Visit Diagnosis: Pain;Unsteadiness on feet (R26.81);Other abnormalities of gait and mobility (R26.89);Difficulty in walking, not elsewhere classified (R26.2);Muscle weakness (generalized) (M62.81) Pain - Right/Left: Left Pain - part of body: Leg     Time: 0350-0938 PT Time Calculation (min) (ACUTE ONLY): 29 min  Charges:  $Therapeutic Exercise: 23-37 mins                      Scot Dock, PTA 01/16/2019, 12:21 PM

## 2019-01-16 NOTE — Progress Notes (Signed)
[facility]  Daily Progress Note   Subjective  - 2 Days Post-Op  Pain control is reasonable.  No fevers.  No major events other than chipping a tooth yesterday.  Objective Vitals:   01/16/19 0338 01/16/19 0500 01/16/19 1145 01/16/19 1150  BP: 136/68  139/88   Pulse: 97  96 99  Resp: 20  19   Temp: 97.8 F (36.6 C)  98.5 F (36.9 C)   TempSrc: Oral  Oral   SpO2: 96%  100% 93%  Weight:  120.9 kg    Height:        Intake/Output Summary (Last 24 hours) at 01/16/2019 1534 Last data filed at 01/16/2019 0900 Gross per 24 hour  Intake 0 ml  Output 1000 ml  Net -1000 ml    PULM  CTAB CV  RRR VASC  VAC with a good seal on the left BKA stump.  No significant surrounding erythema.  Laboratory CBC    Component Value Date/Time   WBC 7.3 01/16/2019 0358   HGB 8.0 (L) 01/16/2019 0358   HCT 26.6 (L) 01/16/2019 0358   PLT 353 01/16/2019 0358    BMET    Component Value Date/Time   NA 135 01/16/2019 0358   K 4.3 01/16/2019 0358   CL 100 01/16/2019 0358   CO2 30 01/16/2019 0358   GLUCOSE 138 (H) 01/16/2019 0358   BUN 21 (H) 01/16/2019 0358   CREATININE 0.78 01/16/2019 0358   CALCIUM 8.5 (L) 01/16/2019 0358   GFRNONAA >60 01/16/2019 0358   GFRAA >60 01/16/2019 0358    Assessment/Planning: POD #9 s/p left BKA and is postoperative day #2 status post debridement of the BKA.   Doing reasonably well   plan to change VAC tomorrow at the bedside.  We will continue her meropenem until Friday for her complicated UTI  Possible discharge to rehab on Friday after her morning dose of meropenem if the wound looks good on VAC change tomorrow    Festus Barren  01/16/2019, 3:34 PM

## 2019-01-17 DIAGNOSIS — I998 Other disorder of circulatory system: Principal | ICD-10-CM

## 2019-01-17 LAB — GLUCOSE, CAPILLARY
Glucose-Capillary: 65 mg/dL — ABNORMAL LOW (ref 70–99)
Glucose-Capillary: 85 mg/dL (ref 70–99)
Glucose-Capillary: 152 mg/dL — ABNORMAL HIGH (ref 70–99)
Glucose-Capillary: 161 mg/dL — ABNORMAL HIGH (ref 70–99)
Glucose-Capillary: 178 mg/dL — ABNORMAL HIGH (ref 70–99)

## 2019-01-17 LAB — BASIC METABOLIC PANEL
Anion gap: 5 (ref 5–15)
BUN: 19 mg/dL (ref 6–20)
CO2: 30 mmol/L (ref 22–32)
Calcium: 8.6 mg/dL — ABNORMAL LOW (ref 8.9–10.3)
Chloride: 101 mmol/L (ref 98–111)
Creatinine, Ser: 0.73 mg/dL (ref 0.44–1.00)
GFR calc Af Amer: >60 mL/min (ref >60)
Glucose, Bld: 91 mg/dL (ref 70–99)
Potassium: 4.3 mmol/L (ref 3.5–5.1)
Sodium: 136 mmol/L (ref 135–145)

## 2019-01-17 LAB — CBC
HCT: 26.4 % — ABNORMAL LOW (ref 36.0–46.0)
Hemoglobin: 7.8 g/dL — ABNORMAL LOW (ref 12.0–15.0)
MCH: 26 pg (ref 26.0–34.0)
MCHC: 29.5 g/dL — ABNORMAL LOW (ref 30.0–36.0)
MCV: 88 fL (ref 80.0–100.0)
PLATELETS: 381 10*3/uL (ref 150–400)
RBC: 3 MIL/uL — ABNORMAL LOW (ref 3.87–5.11)
RDW: 18.9 % — ABNORMAL HIGH (ref 11.5–15.5)
WBC: 9.7 10*3/uL (ref 4.0–10.5)
nRBC: 0 % (ref 0.0–0.2)

## 2019-01-17 LAB — PHOSPHORUS: PHOSPHORUS: 3.3 mg/dL (ref 2.5–4.6)

## 2019-01-17 LAB — MAGNESIUM: Magnesium: 2 mg/dL (ref 1.7–2.4)

## 2019-01-17 MED ORDER — IPRATROPIUM-ALBUTEROL 0.5-2.5 (3) MG/3ML IN SOLN: 3.0000 mL | RESPIRATORY_TRACT | 0 refills | Status: AC | PRN

## 2019-01-17 MED ORDER — LISINOPRIL 2.5 MG PO TABS: 2.5000 mg | ORAL_TABLET | Freq: Every day | ORAL | 1 refills | Status: DC

## 2019-01-17 MED ORDER — BISACODYL 5 MG PO TBEC: 5.0000 mg | DELAYED_RELEASE_TABLET | Freq: Every day | ORAL | 0 refills | Status: DC | PRN

## 2019-01-17 MED ORDER — SENNA 8.6 MG PO TABS: 1.0000 | ORAL_TABLET | Freq: Every day | ORAL | 0 refills | Status: DC | PRN

## 2019-01-17 MED ORDER — MORPHINE SULFATE (PF) 2 MG/ML IV SOLN
2.0000 mg | Freq: Once | INTRAVENOUS | Status: DC
  Filled 2019-01-17: qty 1

## 2019-01-17 NOTE — Progress Notes (Signed)
MD notified regarding pt.'s CBG this a.m. and her Lantus 35 units due at this time. Verbal order to give her 35 units of Lantus at this time. Pt ate her breakfast this a.m.  Keltin Baird [employer]

## 2019-01-17 NOTE — Consult Note (Signed)
PHARMACY CONSULT NOTE - FOLLOW UP  Pharmacy Consult for Electrolyte Monitoring and Replacement   Recent Labs: Potassium (mmol/L)  Date Value  01/17/2019 4.3   Magnesium (mg/dL)  Date Value  96/78/9381 2.0   Calcium (mg/dL)  Date Value  01/75/1025 8.6 (L)   Albumin (g/dL)  Date Value  85/27/7824 1.2 (L)   Phosphorus (mg/dL)  Date Value  23/53/6144 3.3   Sodium (mmol/L)  Date Value  01/17/2019 136   Correct Calcium: 10.8  Assessment: Pharmacy consulted for electrolyte monitoring and replacement in 58 yo female admitted with LLE pain and ulceration. Patient had hypomagnesemia and hypokalemia on admission.   Goal of Therapy:  Electrolytes WNL   Plan:  K 4.3  Mag 2.0 Phos 3.3  Scr 0.73  Patient now on lasix 20 mg IV bid and Sodium chloride 2 gm tabs BID. Electrolytes WNL. Will follow BMP and Mg with AM labs.  Orinda Kenner, PharmD Clinical Pharmacist 01/17/2019 7:15 AM

## 2019-01-17 NOTE — Progress Notes (Signed)
Pt notified RN that she was having a lot of break through pain, her next PRN pain medication at 1844. MD notified about break through pain, verbal order to place a one time dose of morphine  2mg  and pt is able to alternate heat and ice to left leg. Once RN went in the patient's room and told her about the one time dose order of morphine, she stated "I do not want to take it since I will not have it at the facility that I am going to tomorrow and I will wait until my next dose of oxycodone is due." RN will continue to monitor pt.   Illyana Schorsch [employer] [place]

## 2019-01-17 NOTE — Progress Notes (Signed)
Occupational Therapy Treatment Patient Details Name: [name] MRN: [id number] DOB: 1961-08-13 Today's Date: 01/17/2019    History of present illness 58 yo female with PMHx including PVD, anxiety, GERD, DM, HTN, CRF s/p extensive LLE revascularization during this hospitalization. Angiogram showed minimal flow through the calf even after angioplasty and thrombectomy.  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   OT comments  Pt educated in UE theraband exercises.  Access Code: [email or web address]  URL: [email or web address]: 01/17/2019  Prepared by: Susanne Borders                                 Standing Shoulder Horizontal Abduction with Resistance                       Shoulder External Rotation and Scapular Retraction with Resistance                                   Standing Shoulder Single Arm PNF D2 Flexion with Resistance                     Seated Shoulder Horizontal Abduction with Resistance                     Standing Shoulder Diagonal Horizontal Abduction 60/120 Degrees with Resistance                  Isometric Shoulder Abduction with Resistance Loop                                                                                  Follow Up Recommendations  SNF    Equipment Recommendations       Recommendations for Other Services      Precautions / Restrictions Precautions Precautions: Fall Restrictions Weight Bearing Restrictions: Yes RUE Weight Bearing: Weight bearing as tolerated RLE Weight Bearing: Weight bearing as tolerated LLE Weight Bearing: Non weight bearing Other Position/Activity Restrictions: s/p L BKA       Mobility Bed Mobility Overal bed mobility: Modified Independent Bed Mobility: Supine to Sit;Sit to Supine Rolling: Supervision   Supine to sit: Modified independent (Device/Increase time) Sit to supine: Modified independent (Device/Increase time)   General bed mobility comments: Increased  time with pauses and use of rails  Transfers Overall transfer level: Needs assistance Equipment used: Rolling walker (2 wheeled) Transfers: Sit to/from Stand Sit to Stand: Mod assist;+2 physical assistance;Max assist         General transfer comment: stood x 4 up to 1 minute each attempt    Balance   Sitting-balance support: No upper extremity supported;Feet supported Sitting balance-Leahy Scale: Good     Standing balance support: Bilateral upper extremity supported Standing balance-Leahy Scale: Poor Standing balance comment: heavy reliance on UE/walker and close +2 assist secondary to R LE weakness  ADL either performed or assessed with clinical judgement   ADL                                         General ADL Comments: Pt seen for instruction in theraband exercises for increasing UE strength and given red and green theraputty with rec to start with red for 10 reps at least one time a day.      Vision       Perception     Praxis      Cognition Arousal/Alertness: Awake/alert Behavior During Therapy: WFL for tasks assessed/performed Overall Cognitive Status: Within Functional Limits for tasks assessed                                          Exercises Other Exercises Other Exercises: seated AROM for R and LLE - LAQ, marches x 10 each   Shoulder Instructions       General Comments      Pertinent Vitals/ Pain       Pain Assessment: 0-10 Pain Score: 7  Faces Pain Scale: Hurts a little bit Pain Location: LLE Pain Descriptors / Indicators: Aching;Constant Pain Intervention(s): Limited activity within patient's tolerance;Monitored during session;Premedicated before session;Repositioned  Home Living                                          Prior Functioning/Environment              Frequency  Min 2X/week        Progress Toward Goals  OT Goals(current  goals can now be found in the care plan section)  Progress towards OT goals: Progressing toward goals  Acute Rehab OT Goals Patient Stated Goal: to improve independence OT Goal Formulation: With patient Time For Goal Achievement: 01/29/19 Potential to Achieve Goals: Good  Plan Discharge plan remains appropriate;Frequency remains appropriate    Co-evaluation                 AM-PAC OT "6 Clicks" Daily Activity     Outcome Measure   Help from another person eating meals?: None Help from another person taking care of personal grooming?: None Help from another person toileting, which includes using toliet, bedpan, or urinal?: A Lot Help from another person bathing (including washing, rinsing, drying)?: A Lot Help from another person to put on and taking off regular upper body clothing?: None Help from another person to put on and taking off regular lower body clothing?: A Lot 6 Click Score: 18    End of Session    OT Visit Diagnosis: Other abnormalities of gait and mobility (R26.89);Pain;Muscle weakness (generalized) (M62.81) Pain - Right/Left: Left Pain - part of body: Leg   Activity Tolerance Patient tolerated treatment well   Patient Left in bed;with call bell/phone within reach;with family/visitor present   Nurse Communication          Time: 1345-1405 OT Time Calculation (min): 20 min  Charges: OT General Charges $OT Visit: 1 Visit OT Treatments $Therapeutic Exercise: 8-22 mins  Susanne Borders, OTR/L ascom [phone] 01/17/19, 3:36 PM

## 2019-01-17 NOTE — Progress Notes (Signed)
Hypoglycemic Event  CBG: 65  Treatment: 4 oz of cranberry juice   Symptoms: asymptomatic   Follow-up CBG: Time:0807 CBG Result: 85  MD not notified due to CBG increasing with juice and pt is asymptomatic.     Loryn Haacke [employer]

## 2019-01-17 NOTE — Discharge Instructions (Signed)
Vascular Surgery Discharge  1) Patient may shower. Keep below the knee amputation stump clean and dry.  2) VAC Dressing: 125, continuous. Change Monday-Wed-Friday 3) Please keep left knee joint flexible to avoid contracture. 4) Please avoid direct pressure to stump. Encourage elevation of left extremity.  4) Patient with active tobacco abuse - strongly recommend cessation.

## 2019-01-17 NOTE — Progress Notes (Signed)
Physical Therapy Treatment Patient Details Name: [name] MRN: [id number] DOB: July 15, 1961 Today's Date: 01/17/2019    History of Present Illness 58 yo female with PMHx including PVD, anxiety, GERD, DM, HTN, CRF s/p extensive LLE revascularization during this hospitalization. Angiogram showed minimal flow through the calf even after angioplasty and thrombectomy.  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    PT Comments    Pt remains highly motivated to participate.  Pt with overall good bed mobility supine to sit and ability to scoot in long sitting.  She was able to stand x 4 with walker and mod a +2 - max a +2 at times but once up requires only close min guard due to history of R knee buckling when standing.  No buckling noted today but she did fatigue at around 60 seconds each attempt.  Participated in exercises as described below.   Follow Up Recommendations  SNF     Equipment Recommendations       Recommendations for Other Services       Precautions / Restrictions Precautions Precautions: Fall Restrictions Weight Bearing Restrictions: Yes RUE Weight Bearing: Weight bearing as tolerated LLE Weight Bearing: Non weight bearing    Mobility  Bed Mobility Overal bed mobility: Modified Independent Bed Mobility: Supine to Sit;Sit to Supine Rolling: Supervision   Supine to sit: Modified independent (Device/Increase time) Sit to supine: Modified independent (Device/Increase time)   General bed mobility comments: Increased time with pauses and use of rails  Transfers Overall transfer level: Needs assistance Equipment used: Rolling walker (2 wheeled) Transfers: Sit to/from Stand Sit to Stand: Mod assist;+2 physical assistance;Max assist         General transfer comment: stood x 4 up to 1 minute each attempt  Ambulation/Gait                 Stairs             Wheelchair Mobility    Modified Rankin  (Stroke Patients Only)       Balance   Sitting-balance support: No upper extremity supported;Feet supported Sitting balance-Leahy Scale: Good     Standing balance support: Bilateral upper extremity supported Standing balance-Leahy Scale: Poor Standing balance comment: heavy reliance on UE/walker and close +2 assist secondary to R LE weakness                            Cognition Arousal/Alertness: Awake/alert Behavior During Therapy: WFL for tasks assessed/performed Overall Cognitive Status: Within Functional Limits for tasks assessed                                        Exercises Other Exercises Other Exercises: seated AROM for R and LLE - LAQ, marches x 10 each    General Comments        Pertinent Vitals/Pain Pain Assessment: Faces Faces Pain Scale: Hurts a little bit Pain Location: LLE Pain Descriptors / Indicators: Grimacing;Guarding;Aching Pain Intervention(s): Limited activity within patient's tolerance;Monitored during session    Home Living                      Prior Function            PT Goals (current goals can now be found in the care plan section) Progress  towards PT goals: Progressing toward goals    Frequency    7X/week      PT Plan Current plan remains appropriate    Co-evaluation              AM-PAC PT "6 Clicks" Mobility   Outcome Measure  Help needed turning from your back to your side while in a flat bed without using bedrails?: None Help needed moving from lying on your back to sitting on the side of a flat bed without using bedrails?: None Help needed moving to and from a bed to a chair (including a wheelchair)?: A Lot Help needed standing up from a chair using your arms (e.g., wheelchair or bedside chair)?: A Lot Help needed to walk in hospital room?: Total Help needed climbing 3-5 steps with a railing? : Total 6 Click Score: 14    End of Session Equipment Utilized During Treatment:  Gait belt Activity Tolerance: Patient tolerated treatment well Patient left: in bed;with call bell/phone within reach   Pain - Right/Left: Left Pain - part of body: Leg     Time: 4854-6270 PT Time Calculation (min) (ACUTE ONLY): 33 min  Charges:  $Therapeutic Exercise: 8-22 mins $Therapeutic Activity: 8-22 mins                     Danielle Dess, PTA 01/17/19, 11:44 AM

## 2019-01-17 NOTE — Clinical Social Work Note (Signed)
CSW notified by [facility] that patient has been approved for SNF at [facility] healthcare. Authorization good from 1/24-1/26. CSW notified Tresa Endo at [employer] and notified patient and mother of insurance approval. CSW will continue to follow for discharge planning.   Ruthe Mannan MSW, [address] [phone]

## 2019-01-18 LAB — CBC
HCT: 27 % — ABNORMAL LOW (ref 36.0–46.0)
Hemoglobin: 8 g/dL — ABNORMAL LOW (ref 12.0–15.0)
MCH: 25.6 pg — AB (ref 26.0–34.0)
MCHC: 29.6 g/dL — ABNORMAL LOW (ref 30.0–36.0)
MCV: 86.3 fL (ref 80.0–100.0)
Platelets: 429 10*3/uL — ABNORMAL HIGH (ref 150–400)
RBC: 3.13 MIL/uL — ABNORMAL LOW (ref 3.87–5.11)
RDW: 18.8 % — ABNORMAL HIGH (ref 11.5–15.5)
WBC: 9.6 10*3/uL (ref 4.0–10.5)
nRBC: 0 % (ref 0.0–0.2)

## 2019-01-18 LAB — BASIC METABOLIC PANEL
Anion gap: 6 (ref 5–15)
BUN: 16 mg/dL (ref 6–20)
CHLORIDE: 99 mmol/L (ref 98–111)
CO2: 31 mmol/L (ref 22–32)
Calcium: 8.6 mg/dL — ABNORMAL LOW (ref 8.9–10.3)
Creatinine, Ser: 0.6 mg/dL (ref 0.44–1.00)
GFR calc Af Amer: >60 mL/min (ref >60)
GFR calc non Af Amer: >60 mL/min (ref >60)
Glucose, Bld: 138 mg/dL — ABNORMAL HIGH (ref 70–99)
Potassium: 4 mmol/L (ref 3.5–5.1)
Sodium: 136 mmol/L (ref 135–145)

## 2019-01-18 LAB — MAGNESIUM: Magnesium: 1.7 mg/dL (ref 1.7–2.4)

## 2019-01-18 LAB — GLUCOSE, CAPILLARY: Glucose-Capillary: 115 mg/dL — ABNORMAL HIGH (ref 70–99)

## 2019-01-18 MED ORDER — NORTRIPTYLINE HCL 50 MG PO CAPS: 50.0000 mg | ORAL_CAPSULE | Freq: Every day | ORAL | 0 refills | Status: AC

## 2019-01-18 MED ORDER — MAGNESIUM SULFATE 2 GM/50ML IV SOLN
2.0000 g | Freq: Once | INTRAVENOUS | Status: AC
  Administered 2019-01-18: 2 g via INTRAVENOUS
  Filled 2019-01-18: qty 50

## 2019-01-18 NOTE — Care Management (Signed)
[place] with [facility] notified that disposition is to [facility] today.

## 2019-01-18 NOTE — Progress Notes (Signed)
[facility]   Communication Note  Afebrile. Vital signs stable. Making appropriate urine. Labs stable. VAC changed. Wound assessed by Dr. Wyn Quaker.  Finished course of IV ABX for UTI.  Patient stable for discharge.  Objective: Vitals:   01/17/19 0849 01/17/19 1127 01/17/19 2016 01/18/19 0554  BP: 129/68 (!) 121/59 (!) 147/63 130/69  Pulse: 92 94 (!) 115 99  Resp:  20 18 20   Temp:  98.4 F (36.9 C) 98.9 F (37.2 C) 98.4 F (36.9 C)  TempSrc:  Oral Oral Oral  SpO2:  92% 96% 96%  Weight:      Height:        Intake/Output Summary (Last 24 hours) at 01/18/2019 0833 Last data filed at 01/18/2019 0553 Gross per 24 hour  Intake 257.02 ml  Output 1200 ml  Net -942.98 ml    Laboratory: CBC    Component Value Date/Time   WBC 9.6 01/18/2019 0527   HGB 8.0 (L) 01/18/2019 0527   HCT 27.0 (L) 01/18/2019 0527   PLT 429 (H) 01/18/2019 0527   BMET    Component Value Date/Time   NA 136 01/18/2019 0527   K 4.0 01/18/2019 0527   CL 99 01/18/2019 0527   CO2 31 01/18/2019 0527   GLUCOSE 138 (H) 01/18/2019 0527   BUN 16 01/18/2019 0527   CREATININE 0.60 01/18/2019 0527   CALCIUM 8.6 (L) 01/18/2019 0527   GFRNONAA >60 01/18/2019 0527   GFRAA >60 01/18/2019 0527   Icelynn Onken PA-C 01/18/2019 8:33 AM

## 2019-01-18 NOTE — Consult Note (Signed)
PHARMACY CONSULT NOTE - FOLLOW UP  Pharmacy Consult for Electrolyte Monitoring and Replacement   Recent Labs: Potassium (mmol/L)  Date Value  01/18/2019 4.0   Magnesium (mg/dL)  Date Value  90/30/0923 1.7   Calcium (mg/dL)  Date Value  30/06/6225 8.6 (L)   Albumin (g/dL)  Date Value  33/35/4562 1.2 (L)   Phosphorus (mg/dL)  Date Value  56/38/9373 3.3   Sodium (mmol/L)  Date Value  01/18/2019 136   Correct Calcium: 10.8  Assessment: Pharmacy consulted for electrolyte monitoring and replacement in 58 yo female admitted with LLE pain and ulceration. Patient had hypomagnesemia and hypokalemia on admission.   Goal of Therapy:  Electrolytes WNL   Plan:  K 4.0  Mag 1.7  Scr 0.60 Patient now on lasix 20 mg IV bid and Sodium chloride 2 gm tabs BID. Will order Magnesium Sulf 2 gm IV once. Will order follow up magnesium with AM labs.  Orinda Kenner, PharmD Clinical Pharmacist 01/18/2019 7:03 AM

## 2019-01-18 NOTE — Clinical Social Work Note (Signed)
Patient is medically ready for discharge today. CSW notified patient and mother Stephana Hadad [phone] of discharge today to [facility]. CSW also notified Tresa Endo at [employer] of discharge today. Patient will be transported by EMS. RN will call report and call for transport.   Ruthe Mannan MSW, [address] [phone]

## 2019-01-18 NOTE — Care Management Important Message (Signed)
Important Message  Patient Details  Name: [name] MRN: [id number] Date of Birth: 1961/11/05   Medicare Important Message Given:  Yes    Chapman Fitch, RN 01/18/2019, 2:42 PM

## 2019-01-19 LAB — AEROBIC/ANAEROBIC CULTURE W GRAM STAIN (SURGICAL/DEEP WOUND)

## 2019-01-19 LAB — AEROBIC/ANAEROBIC CULTURE (SURGICAL/DEEP WOUND): GRAM STAIN: NONE SEEN

## 2019-01-22 ENCOUNTER — Ambulatory Visit: Payer: Medicare HMO

## 2019-02-01 ENCOUNTER — Encounter (INDEPENDENT_AMBULATORY_CARE_PROVIDER_SITE_OTHER): Payer: Self-pay | Admitting: Vascular Surgery

## 2019-02-01 ENCOUNTER — Ambulatory Visit (INDEPENDENT_AMBULATORY_CARE_PROVIDER_SITE_OTHER): Payer: Medicare HMO | Admitting: Vascular Surgery

## 2019-02-01 VITALS — BP 133/83 | HR 105 | Resp 20

## 2019-02-01 DIAGNOSIS — Z794 Long term (current) use of insulin: Secondary | ICD-10-CM

## 2019-02-01 DIAGNOSIS — E1122 Type 2 diabetes mellitus with diabetic chronic kidney disease: Secondary | ICD-10-CM

## 2019-02-01 DIAGNOSIS — Z89512 Acquired absence of left leg below knee: Secondary | ICD-10-CM

## 2019-02-01 DIAGNOSIS — N183 Chronic kidney disease, stage 3 (moderate): Secondary | ICD-10-CM

## 2019-02-01 DIAGNOSIS — I739 Peripheral vascular disease, unspecified: Secondary | ICD-10-CM

## 2019-02-01 NOTE — Assessment & Plan Note (Signed)
Now status post left below-knee amputation. 

## 2019-02-01 NOTE — Assessment & Plan Note (Signed)
At this point, I think we can come out of the negative pressure dressing and go to a wet-to-dry dressing.  Using Santyl on the wound daily would also be of benefit.  A prescription for Santyl was given today and I would do daily wet-to-dry dressings on the wound.  She will return in a week and a half or so and we will remove the remainder of the staples and sutures.

## 2019-02-01 NOTE — Progress Notes (Signed)
Patient ID: [name], female   DOB: [birth year], 58 y.o.   MRN: [id number]  Chief Complaint  Patient presents with  . Follow-up    HPI [name] is a 58 y.o. female.  Patient returns in follow-up of her left BKA wound.  She is working hard with physical therapy but has not gotten her mobility back yet.  The wound overall looks pretty good with some mild to moderate fibrinous exudate.  No further separation of the wound.  There is a good bed of granulation tissue present.  The opening is laterally with the medial portion of the wound being well opposed.  I removed some of the staples today.   Past Medical History:  Diagnosis Date  . Acute pain of right shoulder 05/19/2016  . Acute PN (pyelonephritis) 05/18/2016  . Acute pyelonephritis 05/18/2016  . Anxiety   . Arthritis   . Asthma   . Diabetes mellitus without complication (HCC)   . GERD (gastroesophageal reflux disease)   . Glaucoma   . Hyperlipemia   . Hyperlipemia   . Hypertension   . Peripheral vascular disease [facility])     Past Surgical History:  Procedure Laterality Date  . AMPUTATION Left 01/07/2019   Procedure: AMPUTATION BELOW KNEE;  Surgeon: Annice Needy, MD;  Location: [facility] ORS;  Service: Vascular;  Laterality: Left;  . ANTERIOR CRUCIATE LIGAMENT REPAIR    . APPLICATION OF WOUND VAC Left 01/14/2019   Procedure: APPLICATION OF WOUND VAC;  Surgeon: Annice Needy, MD;  Location: [facility] ORS;  Service: Vascular;  Laterality: Left;  Wound vac [id number]   . FASCIOTOMY Left 01/01/2019   Procedure: FASCIOTOMY LEFT LEG;  Surgeon: Annice Needy, MD;  Location: [facility] ORS;  Service: Vascular;  Laterality: Left;  . I&D EXTREMITY Left 01/14/2019   Procedure: IRRIGATION AND DEBRIDEMENT EXTREMITY-LEFT BKA;  Surgeon: Annice Needy, MD;  Location: [facility] ORS;  Service: Vascular;  Laterality: Left;  . LOWER EXTREMITY ANGIOGRAPHY Left 12/17/2018   Procedure: LOWER EXTREMITY ANGIOGRAPHY;  Surgeon: Annice Needy, MD;  Location: [facility];  Service: Cardiovascular;  Laterality: Left;  . LOWER EXTREMITY ANGIOGRAPHY Left 12/31/2018   Procedure: Lower Extremity Angiography;  Surgeon: Annice Needy, MD;  Location: [facility];  Service: Cardiovascular;  Laterality: Left;  . LOWER EXTREMITY ANGIOGRAPHY Left 01/01/2019   Procedure: Lower Extremity Angiography;  Surgeon: Annice Needy, MD;  Location: [facility];  Service: Cardiovascular;  Laterality: Left;      Allergies  Allergen Reactions  . Biaxin [Clarithromycin] Rash    Patient states this medication gives her severe rash and thrush in the mouth.    Current Outpatient Medications  Medication Sig Dispense Refill  . acetaminophen (TYLENOL) 325 MG tablet Take 2 tablets (650 mg total) by mouth every 6 (six) hours as needed for mild pain (or Fever >/= 101).    Marland Kitchen acetaminophen (TYLENOL) 650 MG suppository Place 1 suppository (650 mg total) rectally every 6 (six) hours as needed for mild pain (or Fever >/= 101). 12 suppository 0  . albuterol (PROAIR HFA) 108 (90 Base) MCG/ACT inhaler     . Ascorbic Acid (VITAMIN C) 1000 MG tablet Take 1,000 mg by mouth daily.    Marland Kitchen aspirin EC 81 MG tablet Take 1 tablet (81 mg total) by mouth daily. 150 tablet 2  . atorvastatin (LIPITOR) 20 MG tablet Take 20 mg by mouth at bedtime.  12  . bisacodyl (DULCOLAX) 5  MG EC tablet Take 1 tablet (5 mg total) by mouth daily as needed for moderate constipation. 30 tablet 0  . Calcium Carbonate-Vitamin D3 (CALCIUM 600-D) 600-400 MG-UNIT TABS Take by mouth 2 (two) times daily.    . cholecalciferol (VITAMIN D) 1000 units tablet Take 5,000 Units by mouth daily.    . clonazepam (KLONOPIN) 0.25 MG disintegrating tablet Take 1 tablet (0.25 mg total) by mouth 2 (two) times daily as needed (Anxiety). 60 tablet 5  . fluticasone (FLONASE) 50 MCG/ACT nasal spray     . furosemide (LASIX) 20 MG tablet Take 1 tablet (20 mg total) by mouth daily. 30 tablet 0  . insulin aspart (NOVOLOG) 100 UNIT/ML injection  Inject 4 Units into the skin 3 (three) times daily with meals. 10 mL 11  . insulin glargine (LANTUS) 100 UNIT/ML injection Inject 0.35 mLs (35 Units total) into the skin daily. 10 mL 11  . ipratropium-albuterol (DUONEB) 0.5-2.5 (3) MG/3ML SOLN Take 3 mLs by nebulization every 4 (four) hours as needed. 360 mL 0  . latanoprost (XALATAN) 0.005 % ophthalmic solution Place 1 drop into both eyes at bedtime. 2.5 mL 0  . loperamide (IMODIUM) 2 MG capsule Take 1 capsule (2 mg total) by mouth every 6 (six) hours as needed for diarrhea or loose stools. 30 capsule 0  . metFORMIN (GLUCOPHAGE-XR) 500 MG 24 hr tablet Take 1 tablet by mouth 2 (two) times daily.  2  . metoprolol tartrate (LOPRESSOR) 25 MG tablet Take 1 tablet (25 mg total) by mouth 2 (two) times daily. 60 tablet 0  . multivitamin-lutein (OCUVITE-LUTEIN) CAPS capsule Take 1 capsule by mouth daily. 30 capsule 11  . nicotine (NICODERM CQ - DOSED IN MG/24 HOURS) 14 mg/24hr patch Place 1 patch (14 mg total) onto the skin daily. 28 patch 0  . nicotine polacrilex (NICORETTE) 4 MG gum Take 1 each (4 mg total) by mouth as needed for smoking cessation. 100 tablet 0  . nortriptyline (PAMELOR) 50 MG capsule Take 1 capsule (50 mg total) by mouth at bedtime. 90 capsule 0  . ondansetron (ZOFRAN) 4 MG/2ML SOLN injection Inject 2 mLs (4 mg total) into the vein every 6 (six) hours as needed for nausea or vomiting. 2 mL 0  . oxyCODONE (OXY IR/ROXICODONE) 5 MG immediate release tablet Take 1-2 tablets (5-10 mg total) by mouth every 4 (four) hours as needed for moderate pain. 40 tablet 0  . senna (SENOKOT) 8.6 MG TABS tablet Take 1 tablet (8.6 mg total) by mouth daily as needed for mild constipation. 120 each 0  . sodium chloride 1 g tablet Take 2 tablets (2 g total) by mouth 2 (two) times daily with a meal. 60 tablet 0   No current facility-administered medications for this visit.         Physical Exam BP 133/83 (BP Location: Right Arm, Patient Position:  Sitting)   Pulse (!) 105   Resp 20  Gen:  WD/WN, NAD Skin: The wound overall looks pretty good with some mild to moderate fibrinous exudate.  No further separation of the wound.  There is a good bed of granulation tissue present.  The opening is laterally with the medial portion of the wound being well opposed.     Assessment/Plan:  PAD (peripheral artery disease) (HCC) Now status post left below-knee amputation.  Diabetes mellitus (HCC) blood glucose control important in reducing the progression of atherosclerotic disease. Also, involved in wound healing. On appropriate medications.   Hx of BKA, left (HCC)  At this point, I think we can come out of the negative pressure dressing and go to a wet-to-dry dressing.  Using Santyl on the wound daily would also be of benefit.  A prescription for Santyl was given today and I would do daily wet-to-dry dressings on the wound.  She will return in a week and a half or so and we will remove the remainder of the staples and sutures.      Festus BarrenJason Kamri Gotsch 02/01/2019, 11:37 AM   This note was created with Dragon medical transcription system.  Any errors from dictation are unintentional.

## 2019-02-01 NOTE — Assessment & Plan Note (Signed)
blood glucose control important in reducing the progression of atherosclerotic disease. Also, involved in wound healing. On appropriate medications.  

## 2019-02-13 ENCOUNTER — Ambulatory Visit (INDEPENDENT_AMBULATORY_CARE_PROVIDER_SITE_OTHER): Payer: Medicare HMO | Admitting: Nurse Practitioner

## 2019-02-14 ENCOUNTER — Encounter (INDEPENDENT_AMBULATORY_CARE_PROVIDER_SITE_OTHER): Payer: Self-pay | Admitting: Nurse Practitioner

## 2019-02-14 ENCOUNTER — Ambulatory Visit (INDEPENDENT_AMBULATORY_CARE_PROVIDER_SITE_OTHER): Payer: Medicare HMO | Admitting: Nurse Practitioner

## 2019-02-14 VITALS — BP 126/81 | HR 97 | Resp 17

## 2019-02-14 DIAGNOSIS — Z89512 Acquired absence of left leg below knee: Secondary | ICD-10-CM

## 2019-02-14 DIAGNOSIS — E1122 Type 2 diabetes mellitus with diabetic chronic kidney disease: Secondary | ICD-10-CM

## 2019-02-14 DIAGNOSIS — Z794 Long term (current) use of insulin: Secondary | ICD-10-CM

## 2019-02-14 DIAGNOSIS — T8781 Dehiscence of amputation stump: Secondary | ICD-10-CM

## 2019-02-14 DIAGNOSIS — N183 Chronic kidney disease, stage 3 (moderate): Secondary | ICD-10-CM

## 2019-02-14 DIAGNOSIS — T8130XA Disruption of wound, unspecified, initial encounter: Secondary | ICD-10-CM

## 2019-02-14 MED ORDER — CEPHALEXIN 500 MG PO CAPS: 500.0000 mg | ORAL_CAPSULE | Freq: Three times a day (TID) | ORAL | 0 refills | Status: DC

## 2019-02-15 ENCOUNTER — Encounter (INDEPENDENT_AMBULATORY_CARE_PROVIDER_SITE_OTHER): Payer: Self-pay | Admitting: Nurse Practitioner

## 2019-02-15 DIAGNOSIS — T8130XA Disruption of wound, unspecified, initial encounter: Secondary | ICD-10-CM | POA: Insufficient documentation

## 2019-02-15 NOTE — Progress Notes (Signed)
SUBJECTIVE:  Patient ID: [name], female    DOB: 06/25/1961, 58 y.o.   MRN: [id number] Chief Complaint  Patient presents with  . Follow-up    1 1/2week staple removal    HPI  [name] is a 58 y.o. female that presents today for staple removal of her left below-knee amputation. on initial inspection the lateral aspect of her wound has dehisced.  There is also dehiscence near the anterior portion as well.  The patient states that she previously had a wound VAC placed and that it was taken off last week.  The wound is very wet moist and covered in fibrinous exudate.  Also is weeping.  It is also boggy and erythematous.  The patient states that her wound is being packed via the wound care nurses with iodoform.  Patient denies any fever, chills, nausea, vomiting or diarrhea.  She endorses continuing to smoke a cigarette daily, although she states that she is trying to quit.  The patient is also on a diabetic diet.  Past Medical History:  Diagnosis Date  . Acute pain of right shoulder 05/19/2016  . Acute PN (pyelonephritis) 05/18/2016  . Acute pyelonephritis 05/18/2016  . Anxiety   . Arthritis   . Asthma   . Diabetes mellitus without complication (HCC)   . GERD (gastroesophageal reflux disease)   . Glaucoma   . Hyperlipemia   . Hyperlipemia   . Hypertension   . Peripheral vascular disease [facility])     Past Surgical History:  Procedure Laterality Date  . AMPUTATION Left 01/07/2019   Procedure: AMPUTATION BELOW KNEE;  Surgeon: Annice Needy, MD;  Location: [facility] ORS;  Service: Vascular;  Laterality: Left;  . ANTERIOR CRUCIATE LIGAMENT REPAIR    . APPLICATION OF WOUND VAC Left 01/14/2019   Procedure: APPLICATION OF WOUND VAC;  Surgeon: Annice Needy, MD;  Location: [facility] ORS;  Service: Vascular;  Laterality: Left;  Wound vac [id number]   . FASCIOTOMY Left 01/01/2019   Procedure: FASCIOTOMY LEFT LEG;  Surgeon: Annice Needy, MD;  Location: [facility] ORS;  Service: Vascular;  Laterality: Left;    . I&D EXTREMITY Left 01/14/2019   Procedure: IRRIGATION AND DEBRIDEMENT EXTREMITY-LEFT BKA;  Surgeon: Annice Needy, MD;  Location: [facility] OR[facility];  Service: Vascular;  Laterality: Left;  . LOWER EXTREMITY ANGIOGRAPHY Left 12/17/2018   Procedure: LOWER EXTREMITY ANGIOGRAPHY;  Surgeon: Annice Needy, MD;  Location: [facility];  Service: Cardiovascular;  Laterality: Left;  . LOWER EXTREMITY ANGIOGRAPHY Left 12/31/2018   Procedure: Lower Extremity Angiography;  Surgeon: Annice Needy, MD;  Location: [facility];  Service: Cardiovascular;  Laterality: Left;  . LOWER EXTREMITY ANGIOGRAPHY Left 01/01/2019   Procedure: Lower Extremity Angiography;  Surgeon: Annice Needy, MD;  Location: [facility];  Service: Cardiovascular;  Laterality: Left;    Social History   Socioeconomic History  . Marital status: Divorced    Spouse name: Not on file  . Number of children: Not on file  . Years of education: Not on file  . Highest education level: Not on file  Occupational History  . Not on file  Social Needs  . Financial resource strain: Patient refused  . Food insecurity:    Worry: Patient refused    Inability: Patient refused  . Transportation needs:    Medical: Patient refused    Non-medical: Patient refused  Tobacco Use  . Smoking status: Former Smoker    Packs/day: 0.50  Start date: 01/18/2019  . Smokeless tobacco: Never Used  Substance and Sexual Activity  . Alcohol use: No  . Drug use: Never  . Sexual activity: Not on file  Lifestyle  . Physical activity:    Days per week: Patient refused    Minutes per session: Patient refused  . Stress: Patient refused  Relationships  . Social connections:    Talks on phone: Patient refused    Gets together: Patient refused    Attends religious service: Patient refused    Active member of club or organization: Patient refused    Attends meetings of clubs or organizations: Patient refused    Relationship status: Patient refused   . Intimate partner violence:    Fear of current or ex partner: Patient refused    Emotionally abused: Patient refused    Physically abused: Patient refused    Forced sexual activity: Patient refused  Other Topics Concern  . Not on file  Social History Narrative  . Not on file    Family History  Problem Relation Age of Onset  . Diabetes Mother   . Heart disease Mother   . Stroke Mother   . Kidney cancer Neg Hx   . Prostate cancer Neg Hx     Allergies  Allergen Reactions  . Biaxin [Clarithromycin] Rash    Patient states this medication gives her severe rash and thrush in the mouth.     Review of Systems   Review of Systems: Negative Unless Checked Constitutional: [] Weight loss  [] Fever  [] Chills Cardiac: [] Chest pain   []  Atrial Fibrillation  [] Palpitations   [] Shortness of breath when laying flat   [] Shortness of breath with exertion. [] Shortness of breath at rest Vascular:  [] Pain in legs with walking   [] Pain in legs with standing [] Pain in legs when laying flat   [] Claudication    [] Pain in feet when laying flat    [] History of DVT   [] Phlebitis   [x] Swelling in legs   [] Varicose veins   [] Non-healing ulcers Pulmonary:   [] Uses home oxygen   [] Productive cough   [] Hemoptysis   [] Wheeze  [] COPD   [] Asthma Neurologic:  [] Dizziness   [] Seizures  [] Blackouts [] History of stroke   [] History of TIA  [] Aphasia   [] Temporary Blindness   [] Weakness or numbness in arm   [x] Weakness or numbness in leg Musculoskeletal:   [] Joint swelling   [] Joint pain   [] Low back pain  []  History of Knee Replacement [] Arthritis [] back Surgeries  []  Spinal Stenosis    Hematologic:  [] Easy bruising  [] Easy bleeding   [] Hypercoagulable state   [] Anemic Gastrointestinal:  [] Diarrhea   [] Vomiting  [] Gastroesophageal reflux/heartburn   [] Difficulty swallowing. [] Abdominal pain Genitourinary:  [] Chronic kidney disease   [] Difficult urination  [] Anuric   [] Blood in urine [] Frequent urination  [] Burning with  urination   [] Hematuria Skin:  [] Rashes   [] Ulcers [x] Wounds Psychological:  [] History of anxiety   []  History of major depression  [x]  Memory Difficulties      OBJECTIVE:   Physical Exam  BP 126/81 (BP Location: Left Arm)   Pulse 97   Resp 17   Gen: WD/WN, NAD Head: [place]/AT, No temporalis wasting.  Ear/Nose/Throat: Hearing grossly intact, nares w/o erythema or drainage Eyes: PER, EOMI, sclera nonicteric.  Neck: Supple, no masses.  No JVD.  Pulmonary:  Good air movement, no use of accessory muscles.  Cardiac: RRR Vascular: largest open wound measures 10 cm x 2.5 cm x 1 cm. Total incition  measures 20 cm by 2 cm x 1 cm Vessel Right Left  Radial Palpable Palpable   Gastrointestinal: soft, non-distended. No guarding/no peritoneal signs.  Musculoskeletal: M/S 5/5 throughout.  Left below-knee amputation Neurologic: Pain and light touch intact in extremities.  Symmetrical.  Speech is fluent. Motor exam as listed above. Psychiatric: Judgment intact, Mood & affect appropriate for pt's clinical situation. Dermatologic:  Very erythematous wound.  See pictures for description Lymph : No Cervical lymphadenopathy, no lichenification or skin changes of chronic lymphedema.           ASSESSMENT AND PLAN:  1. Wound disruption, initial encounter Removed sutures today.  Left staples in place.  After removal of sutures copious weeping ensued.  Also gave a prescription for Keflex for concern of infection.  We will plan to do a wound debridement with subsequent wound VAC placement.  All risks, benefits, and alternatives were explained with the patient.  She agreed to proceed with the procedure.  She will follow-up in the office following.  2. Type 2 diabetes mellitus with stage 3 chronic kidney disease, with long-term current use of insulin (HCC) Continue hypoglycemic medications as already ordered, these medications have been reviewed and there are no changes at this time.  Hgb A1C to be  monitored as already arranged by primary service    Current Outpatient Medications on File Prior to Visit  Medication Sig Dispense Refill  . acetaminophen (TYLENOL) 325 MG tablet Take 2 tablets (650 mg total) by mouth every 6 (six) hours as needed for mild pain (or Fever >/= 101).    Marland Kitchen. acetaminophen (TYLENOL) 650 MG suppository Place 1 suppository (650 mg total) rectally every 6 (six) hours as needed for mild pain (or Fever >/= 101). 12 suppository 0  . albuterol (PROAIR HFA) 108 (90 Base) MCG/ACT inhaler     . Ascorbic Acid (VITAMIN C) 1000 MG tablet Take 1,000 mg by mouth daily.    Marland Kitchen. aspirin EC 81 MG tablet Take 1 tablet (81 mg total) by mouth daily. 150 tablet 2  . atorvastatin (LIPITOR) 20 MG tablet Take 20 mg by mouth at bedtime.  12  . bisacodyl (DULCOLAX) 5 MG EC tablet Take 1 tablet (5 mg total) by mouth daily as needed for moderate constipation. 30 tablet 0  . Calcium Carbonate-Vitamin D3 (CALCIUM 600-D) 600-400 MG-UNIT TABS Take by mouth 2 (two) times daily.    . cholecalciferol (VITAMIN D) 1000 units tablet Take 5,000 Units by mouth daily.    . clonazepam (KLONOPIN) 0.25 MG disintegrating tablet Take 1 tablet (0.25 mg total) by mouth 2 (two) times daily as needed (Anxiety). 60 tablet 5  . fluticasone (FLONASE) 50 MCG/ACT nasal spray     . furosemide (LASIX) 20 MG tablet Take 1 tablet (20 mg total) by mouth daily. 30 tablet 0  . insulin aspart (NOVOLOG) 100 UNIT/ML injection Inject 4 Units into the skin 3 (three) times daily with meals. 10 mL 11  . insulin glargine (LANTUS) 100 UNIT/ML injection Inject 0.35 mLs (35 Units total) into the skin daily. 10 mL 11  . ipratropium-albuterol (DUONEB) 0.5-2.5 (3) MG/3ML SOLN Take 3 mLs by nebulization every 4 (four) hours as needed. 360 mL 0  . latanoprost (XALATAN) 0.005 % ophthalmic solution Place 1 drop into both eyes at bedtime. 2.5 mL 0  . loperamide (IMODIUM) 2 MG capsule Take 1 capsule (2 mg total) by mouth every 6 (six) hours as needed  for diarrhea or loose stools. 30 capsule 0  . metFORMIN (  GLUCOPHAGE-XR) 500 MG 24 hr tablet Take 1 tablet by mouth 2 (two) times daily.  2  . metoprolol tartrate (LOPRESSOR) 25 MG tablet Take 1 tablet (25 mg total) by mouth 2 (two) times daily. 60 tablet 0  . multivitamin-lutein (OCUVITE-LUTEIN) CAPS capsule Take 1 capsule by mouth daily. 30 capsule 11  . nicotine (NICODERM CQ - DOSED IN MG/24 HOURS) 14 mg/24hr patch Place 1 patch (14 mg total) onto the skin daily. 28 patch 0  . nicotine polacrilex (NICORETTE) 4 MG gum Take 1 each (4 mg total) by mouth as needed for smoking cessation. 100 tablet 0  . nortriptyline (PAMELOR) 50 MG capsule Take 1 capsule (50 mg total) by mouth at bedtime. 90 capsule 0  . ondansetron (ZOFRAN) 4 MG/2ML SOLN injection Inject 2 mLs (4 mg total) into the vein every 6 (six) hours as needed for nausea or vomiting. 2 mL 0  . oxyCODONE (OXY IR/ROXICODONE) 5 MG immediate release tablet Take 1-2 tablets (5-10 mg total) by mouth every 4 (four) hours as needed for moderate pain. 40 tablet 0  . senna (SENOKOT) 8.6 MG TABS tablet Take 1 tablet (8.6 mg total) by mouth daily as needed for mild constipation. 120 each 0  . sodium chloride 1 g tablet Take 2 tablets (2 g total) by mouth 2 (two) times daily with a meal. 60 tablet 0   No current facility-administered medications on file prior to visit.     There are no Patient Instructions on file for this visit. No follow-ups on file.   Georgiana SpinnerFallon E Jovi Zavadil, NP  This note was completed with Office managerDragon Dictation.  Any errors are purely unintentional.

## 2019-02-18 ENCOUNTER — Telehealth (INDEPENDENT_AMBULATORY_CARE_PROVIDER_SITE_OTHER): Payer: Self-pay | Admitting: Vascular Surgery

## 2019-02-18 NOTE — Telephone Encounter (Signed)
Patient calling with questions about surgery on Thursday. States she knows nothing about this surgery?

## 2019-02-18 NOTE — Telephone Encounter (Signed)
Spoke with the patient and let her know to check in on 02/21/2019 @ 10:00 am at the registration desk and from there they will get her to pre-admit. This is all due to the fact the patient rides transportation and they have to pick her up by 4:00 pm.

## 2019-02-18 NOTE — Telephone Encounter (Signed)
Spoken with the patient and let her know I am trying to get a time for her pre-op that has to be on 02/21/2019.

## 2019-02-19 ENCOUNTER — Telehealth (INDEPENDENT_AMBULATORY_CARE_PROVIDER_SITE_OTHER): Payer: Self-pay | Admitting: Vascular Surgery

## 2019-02-19 NOTE — Telephone Encounter (Signed)
Patient called stating she is scheduled for surgery on 02/21/19 and wanted to know if she was supposed to D/C Plavix.   I spoke with Vernona Rieger. She said patient should NOT D/C Plavix for this surgery. It is a wound debridement.   Patient is aware to continue Plavix. AS, CMA

## 2019-02-20 ENCOUNTER — Other Ambulatory Visit (INDEPENDENT_AMBULATORY_CARE_PROVIDER_SITE_OTHER): Payer: Self-pay | Admitting: Nurse Practitioner

## 2019-02-20 MED ORDER — CEFAZOLIN SODIUM-DEXTROSE 2-4 GM/100ML-% IV SOLN
2.0000 g | INTRAVENOUS | Status: AC
  Administered 2019-02-21: 2 g via INTRAVENOUS

## 2019-02-21 ENCOUNTER — Ambulatory Visit: Payer: Medicare HMO | Admitting: Anesthesiology

## 2019-02-21 ENCOUNTER — Encounter: Payer: Self-pay | Admitting: Anesthesiology

## 2019-02-21 ENCOUNTER — Other Ambulatory Visit: Payer: Self-pay

## 2019-02-21 ENCOUNTER — Ambulatory Visit
Admission: RE | Admit: 2019-02-21 | Discharge: 2019-02-21 | Disposition: A | Discharge status: Home / Self Care | Payer: Medicare HMO | Attending: Vascular Surgery | Admitting: Vascular Surgery

## 2019-02-21 ENCOUNTER — Encounter
Admission: RE | Disposition: A | Discharge status: Home / Self Care | Payer: Self-pay | Source: Home / Self Care | Attending: Vascular Surgery

## 2019-02-21 DIAGNOSIS — Z7982 Long term (current) use of aspirin: Secondary | ICD-10-CM | POA: Diagnosis not present

## 2019-02-21 DIAGNOSIS — Z89512 Acquired absence of left leg below knee: Secondary | ICD-10-CM | POA: Diagnosis not present

## 2019-02-21 DIAGNOSIS — Z7951 Long term (current) use of inhaled steroids: Secondary | ICD-10-CM | POA: Insufficient documentation

## 2019-02-21 DIAGNOSIS — Z87891 Personal history of nicotine dependence: Secondary | ICD-10-CM | POA: Diagnosis not present

## 2019-02-21 DIAGNOSIS — Z7901 Long term (current) use of anticoagulants: Secondary | ICD-10-CM | POA: Insufficient documentation

## 2019-02-21 DIAGNOSIS — E10628 Type 1 diabetes mellitus with other skin complications: Secondary | ICD-10-CM | POA: Diagnosis not present

## 2019-02-21 DIAGNOSIS — Z794 Long term (current) use of insulin: Secondary | ICD-10-CM | POA: Diagnosis not present

## 2019-02-21 DIAGNOSIS — T8781 Dehiscence of amputation stump: Secondary | ICD-10-CM | POA: Diagnosis not present

## 2019-02-21 DIAGNOSIS — F419 Anxiety disorder, unspecified: Secondary | ICD-10-CM | POA: Insufficient documentation

## 2019-02-21 DIAGNOSIS — T8130XA Disruption of wound, unspecified, initial encounter: Secondary | ICD-10-CM

## 2019-02-21 DIAGNOSIS — J45909 Unspecified asthma, uncomplicated: Secondary | ICD-10-CM | POA: Insufficient documentation

## 2019-02-21 DIAGNOSIS — I10 Essential (primary) hypertension: Secondary | ICD-10-CM | POA: Insufficient documentation

## 2019-02-21 DIAGNOSIS — T8744 Infection of amputation stump, left lower extremity: Secondary | ICD-10-CM | POA: Diagnosis not present

## 2019-02-21 DIAGNOSIS — T8754 Necrosis of amputation stump, left lower extremity: Secondary | ICD-10-CM | POA: Diagnosis not present

## 2019-02-21 HISTORY — PX: APPLICATION OF WOUND VAC: SHX5189

## 2019-02-21 HISTORY — PX: WOUND DEBRIDEMENT: SHX247

## 2019-02-21 LAB — BASIC METABOLIC PANEL
Anion gap: 9 (ref 5–15)
BUN: 6 mg/dL (ref 6–20)
CO2: 32 mmol/L (ref 22–32)
Calcium: 7.3 mg/dL — ABNORMAL LOW (ref 8.9–10.3)
Chloride: 98 mmol/L (ref 98–111)
Creatinine, Ser: 0.61 mg/dL (ref 0.44–1.00)
GFR calc Af Amer: >60 mL/min (ref >60)
Glucose, Bld: 120 mg/dL — ABNORMAL HIGH (ref 70–99)
POTASSIUM: 3.2 mmol/L — AB (ref 3.5–5.1)
Sodium: 139 mmol/L (ref 135–145)

## 2019-02-21 LAB — CBC WITH DIFFERENTIAL/PLATELET
Abs Immature Granulocytes: 0.03 10*3/uL (ref 0.00–0.07)
Basophils Absolute: 0.1 10*3/uL (ref 0.0–0.1)
Basophils Relative: 1 %
Eosinophils Absolute: 0.4 10*3/uL (ref 0.0–0.5)
Eosinophils Relative: 4 %
HCT: 35.7 % — ABNORMAL LOW (ref 36.0–46.0)
Hemoglobin: 10.5 g/dL — ABNORMAL LOW (ref 12.0–15.0)
Immature Granulocytes: 0 %
Lymphocytes Relative: 20 %
Lymphs Abs: 1.7 10*3/uL (ref 0.7–4.0)
MCH: 24.5 pg — ABNORMAL LOW (ref 26.0–34.0)
MCHC: 29.4 g/dL — ABNORMAL LOW (ref 30.0–36.0)
MCV: 83.2 fL (ref 80.0–100.0)
Monocytes Absolute: 0.7 10*3/uL (ref 0.1–1.0)
Monocytes Relative: 8 %
Neutro Abs: 5.7 10*3/uL (ref 1.7–7.7)
Neutrophils Relative %: 67 %
PLATELETS: 405 10*3/uL — AB (ref 150–400)
RBC: 4.29 MIL/uL (ref 3.87–5.11)
RDW: 17 % — ABNORMAL HIGH (ref 11.5–15.5)
WBC: 8.6 10*3/uL (ref 4.0–10.5)
nRBC: 0 % (ref 0.0–0.2)

## 2019-02-21 LAB — GLUCOSE, CAPILLARY
GLUCOSE-CAPILLARY: 101 mg/dL — AB (ref 70–99)
Glucose-Capillary: 117 mg/dL — ABNORMAL HIGH (ref 70–99)

## 2019-02-21 LAB — TYPE AND SCREEN
ABO/RH(D): A POS
ANTIBODY SCREEN: NEGATIVE

## 2019-02-21 LAB — APTT: APTT: 29 s (ref 24–36)

## 2019-02-21 LAB — PROTIME-INR
INR: 1.1 (ref 0.8–1.2)
Prothrombin Time: 13.6 seconds (ref 11.4–15.2)

## 2019-02-21 SURGERY — DEBRIDEMENT, WOUND
Anesthesia: General | Laterality: Left

## 2019-02-21 MED ORDER — ONDANSETRON HCL 4 MG/2ML IJ SOLN: 4.0000 mg | Freq: Four times a day (QID) | INTRAMUSCULAR | Status: DC | PRN

## 2019-02-21 MED ORDER — FAMOTIDINE 20 MG PO TABS
20.0000 mg | ORAL_TABLET | Freq: Once | ORAL | Status: AC
  Administered 2019-02-21: 20 mg via ORAL

## 2019-02-21 MED ORDER — MIDAZOLAM HCL 2 MG/2ML IJ SOLN
INTRAMUSCULAR | Status: DC | PRN
  Administered 2019-02-21: 2 mg via INTRAVENOUS

## 2019-02-21 MED ORDER — LIDOCAINE HCL (PF) 2 % IJ SOLN
INTRAMUSCULAR | Status: AC
  Filled 2019-02-21: qty 10

## 2019-02-21 MED ORDER — PHENYLEPHRINE HCL 10 MG/ML IJ SOLN
INTRAMUSCULAR | Status: DC | PRN
  Administered 2019-02-21 (×7): 100 ug via INTRAVENOUS
  Administered 2019-02-21: 200 ug via INTRAVENOUS

## 2019-02-21 MED ORDER — SUCCINYLCHOLINE CHLORIDE 20 MG/ML IJ SOLN
INTRAMUSCULAR | Status: DC | PRN
  Administered 2019-02-21: 100 mg via INTRAVENOUS

## 2019-02-21 MED ORDER — PROPOFOL 10 MG/ML IV BOLUS
INTRAVENOUS | Status: AC
  Filled 2019-02-21: qty 20

## 2019-02-21 MED ORDER — FENTANYL CITRATE (PF) 100 MCG/2ML IJ SOLN
INTRAMUSCULAR | Status: AC
  Filled 2019-02-21: qty 2

## 2019-02-21 MED ORDER — ONDANSETRON HCL 4 MG/2ML IJ SOLN
INTRAMUSCULAR | Status: DC | PRN
  Administered 2019-02-21: 4 mg via INTRAVENOUS

## 2019-02-21 MED ORDER — DEXAMETHASONE SODIUM PHOSPHATE 10 MG/ML IJ SOLN
INTRAMUSCULAR | Status: AC
  Filled 2019-02-21: qty 1

## 2019-02-21 MED ORDER — FAMOTIDINE 20 MG PO TABS
ORAL_TABLET | ORAL | Status: AC
  Administered 2019-02-21: 20 mg via ORAL
  Filled 2019-02-21: qty 1

## 2019-02-21 MED ORDER — MIDAZOLAM HCL 2 MG/2ML IJ SOLN
INTRAMUSCULAR | Status: AC
  Filled 2019-02-21: qty 2

## 2019-02-21 MED ORDER — PROPOFOL 10 MG/ML IV BOLUS
INTRAVENOUS | Status: DC | PRN
  Administered 2019-02-21: 130 mg via INTRAVENOUS

## 2019-02-21 MED ORDER — LIDOCAINE HCL (CARDIAC) PF 100 MG/5ML IV SOSY
PREFILLED_SYRINGE | INTRAVENOUS | Status: DC | PRN
  Administered 2019-02-21: 60 mg via INTRAVENOUS

## 2019-02-21 MED ORDER — HYDROMORPHONE HCL 1 MG/ML IJ SOLN: 1.0000 mg | Freq: Once | INTRAMUSCULAR | Status: DC | PRN

## 2019-02-21 MED ORDER — SODIUM CHLORIDE 0.9 % IV SOLN
INTRAVENOUS | Status: DC
  Administered 2019-02-21: 11:00:00 via INTRAVENOUS

## 2019-02-21 MED ORDER — OXYCODONE-ACETAMINOPHEN 10-325 MG PO TABS: 1.0000 | ORAL_TABLET | Freq: Three times a day (TID) | ORAL | 0 refills | Status: AC

## 2019-02-21 MED ORDER — CEPHALEXIN 500 MG PO CAPS: 500.0000 mg | ORAL_CAPSULE | Freq: Three times a day (TID) | ORAL | 0 refills | Status: DC

## 2019-02-21 MED ORDER — CEFAZOLIN SODIUM-DEXTROSE 2-4 GM/100ML-% IV SOLN
INTRAVENOUS | Status: AC
  Filled 2019-02-21: qty 100

## 2019-02-21 MED ORDER — ROCURONIUM BROMIDE 100 MG/10ML IV SOLN
INTRAVENOUS | Status: DC | PRN
  Administered 2019-02-21: 10 mg via INTRAVENOUS

## 2019-02-21 MED ORDER — DEXAMETHASONE SODIUM PHOSPHATE 10 MG/ML IJ SOLN
INTRAMUSCULAR | Status: DC | PRN
  Administered 2019-02-21: 4 mg via INTRAVENOUS

## 2019-02-21 MED ORDER — ONDANSETRON HCL 4 MG/2ML IJ SOLN
INTRAMUSCULAR | Status: AC
  Filled 2019-02-21: qty 2

## 2019-02-21 MED ORDER — FENTANYL CITRATE (PF) 100 MCG/2ML IJ SOLN
INTRAMUSCULAR | Status: DC | PRN
  Administered 2019-02-21: 100 ug via INTRAVENOUS

## 2019-02-21 MED ORDER — EPHEDRINE SULFATE 50 MG/ML IJ SOLN
INTRAMUSCULAR | Status: DC | PRN
  Administered 2019-02-21 (×5): 10 mg via INTRAVENOUS

## 2019-02-21 SURGICAL SUPPLY — 38 items
BRUSH SCRUB EZ  4% CHG (MISCELLANEOUS) ×2
BRUSH SCRUB EZ 4% CHG (MISCELLANEOUS) ×1 IMPLANT
CANISTER SUCT 1200ML W/VALVE (MISCELLANEOUS) ×3 IMPLANT
CANISTER WOUND CARE 500ML ATS (WOUND CARE) IMPLANT
CHLORAPREP W/TINT 26ML (MISCELLANEOUS) ×3 IMPLANT
COVER WAND RF STERILE (DRAPES) IMPLANT
DRAPE INCISE IOBAN 66X45 STRL (DRAPES) ×3 IMPLANT
DRAPE LAPAROTOMY 100X77 ABD (DRAPES) ×3 IMPLANT
DRSG VAC ATS MED SENSATRAC (GAUZE/BANDAGES/DRESSINGS) IMPLANT
ELECT CAUTERY BLADE 6.4 (BLADE) ×3 IMPLANT
ELECT REM PT RETURN 9FT ADLT (ELECTROSURGICAL) ×3
ELECTRODE REM PT RTRN 9FT ADLT (ELECTROSURGICAL) ×1 IMPLANT
GLOVE BIO SURGEON STRL SZ7 (GLOVE) ×3 IMPLANT
GOWN STRL REUS W/ TWL LRG LVL3 (GOWN DISPOSABLE) ×1 IMPLANT
GOWN STRL REUS W/ TWL XL LVL3 (GOWN DISPOSABLE) ×1 IMPLANT
GOWN STRL REUS W/TWL LRG LVL3 (GOWN DISPOSABLE) ×2
GOWN STRL REUS W/TWL XL LVL3 (GOWN DISPOSABLE) ×2
IV NS 1000ML (IV SOLUTION) ×2
IV NS 1000ML BAXH (IV SOLUTION) ×1 IMPLANT
KIT TURNOVER KIT A (KITS) ×3 IMPLANT
LABEL OR SOLS (LABEL) ×3 IMPLANT
NS IRRIG 1000ML POUR BTL (IV SOLUTION) IMPLANT
NS IRRIG 500ML POUR BTL (IV SOLUTION) ×3 IMPLANT
PACK BASIN MINOR ARMC (MISCELLANEOUS) ×3 IMPLANT
PACK EXTREMITY ARMC (MISCELLANEOUS) ×3 IMPLANT
PAD PREP 24X41 OB/GYN DISP (PERSONAL CARE ITEMS) ×3 IMPLANT
PULSAVAC PLUS IRRIG FAN TIP (DISPOSABLE) ×3
SOL PREP PVP 2OZ (MISCELLANEOUS) ×3
SOLUTION PREP PVP 2OZ (MISCELLANEOUS) ×1 IMPLANT
SPONGE LAP 18X18 RF (DISPOSABLE) ×3 IMPLANT
SUT ETHILON 4-0 (SUTURE)
SUT ETHILON 4-0 FS2 18XMFL BLK (SUTURE)
SUT VIC AB 3-0 SH 27 (SUTURE)
SUT VIC AB 3-0 SH 27X BRD (SUTURE) IMPLANT
SUTURE ETHLN 4-0 FS2 18XMF BLK (SUTURE) IMPLANT
SWAB CULTURE AMIES ANAERIB BLU (MISCELLANEOUS) IMPLANT
SYR BULB 3OZ (MISCELLANEOUS) IMPLANT
TIP FAN IRRIG PULSAVAC PLUS (DISPOSABLE) ×1 IMPLANT

## 2019-02-21 NOTE — Anesthesia Preprocedure Evaluation (Signed)
Anesthesia Evaluation  Patient identified by MRN, date of birth, ID band Patient awake    Reviewed: Allergy & Precautions, H&P , NPO status , Patient's Chart, lab work & pertinent test results, reviewed documented beta blocker date and time   Airway Mallampati: II  TM Distance: >3 FB Neck ROM: full    Dental  (+) Teeth Intact   Pulmonary neg pulmonary ROS, asthma , Current Smoker, former smoker,    Pulmonary exam normal        Cardiovascular Exercise Tolerance: Poor hypertension, On Medications + Peripheral Vascular Disease  negative cardio ROS Normal cardiovascular exam Rate:Normal     Neuro/Psych Anxiety  Neuromuscular disease negative psych ROS   GI/Hepatic negative GI ROS, Neg liver ROS, GERD  Medicated,  Endo/Other  negative endocrine ROSdiabetes, Poorly Controlled, Type 1, Insulin Dependent  Renal/GU CRFRenal disease  negative genitourinary   Musculoskeletal   Abdominal   Peds  Hematology negative hematology ROS (+)   Anesthesia Other Findings   Reproductive/Obstetrics negative OB ROS                             Anesthesia Physical  Anesthesia Plan  ASA: III and emergent  Anesthesia Plan: General   Post-op Pain Management:    Induction: Intravenous  PONV Risk Score and Plan:   Airway Management Planned: Oral ETT  Additional Equipment:   Intra-op Plan:   Post-operative Plan: Extubation in OR  Informed Consent: I have reviewed the patients History and Physical, chart, labs and discussed the procedure including the risks, benefits and alternatives for the proposed anesthesia with the patient or authorized representative who has indicated his/her understanding and acceptance.       Plan Discussed with: CRNA and Surgeon  Anesthesia Plan Comments:         Anesthesia Quick Evaluation

## 2019-02-21 NOTE — Op Note (Signed)
    OPERATIVE NOTE   PROCEDURE: 1. Irrigation and debridement of left BKA amputation site for approximately 40 cm to the skin, soft tissue, and muscle with negative pressure dressing placement.  PRE-OPERATIVE DIAGNOSIS: Nonviable tissue and infection of left BKA stump  POST-OPERATIVE DIAGNOSIS: Same as above  SURGEON: Festus Barren, MD  ASSISTANT(S): None  ANESTHESIA: General  ESTIMATED BLOOD LOSS: 5 cc  FINDING(S): None  SPECIMEN(S): None  INDICATIONS:   [name] is a 58 y.o. female who presents with partial wound dehiscence with some drainage and erythema to her left BKA stump last week to the office.  She was started on antibiotics and scheduled for a debridement.  Risks and benefits were discussed and informed consent was obtained.  DESCRIPTION: After obtaining full informed written consent, the patient was brought back to the operating room and placed supine upon the operating table.  The patient received IV antibiotics prior to induction.  After obtaining adequate anesthesia, the patient was prepped and draped in the standard fashion.  The wound was then opened and excisional debridement was performed to soft tissue and muscle fascia to remove all clearly non-viable tissue.  This was largely fibrinous exudate.  A small amount of the skin edge was scraped back and excised as well.  The tissue was taken back to bleeding tissue that appeared viable.  The debridement was performed with scalpel and Metzenbaum scissors and encompassed an area of approximately 40 cm2.  The wound was about 14 to 15 cm in length and about 3 to 4 cm in its widest width.  There was some mild tunneling at the medial part of the lateral wound.  There were technically 3 areas of opening with the lateral portion being the largest.  The wound was irrigated copiously with pulse lavage saline.  After all clearly non-viable tissue was removed, a negative pressure dressing was cut to fit the wound.  Was placed on the  wound with strips of Adaptic for an occlusive seal.  It was connected to suction with a good suction obtained and a good seal. The patient was then awakened from anesthesia and taken to the recovery room in stable condition having tolerated the procedure well.  COMPLICATIONS: none  CONDITION: stable  Festus Barren  02/21/2019, 12:57 PM   This note was created with Dragon Medical transcription system. Any errors in dictation are purely unintentional.

## 2019-02-21 NOTE — H&P (Signed)
[facility] History & Physical Update  The patient was interviewed and re-examined.  The patient's previous History and Physical has been reviewed and is unchanged.  There is no change in the plan of care. We plan to proceed with the scheduled procedure.  Festus Barren, MD  02/21/2019, 11:39 AM

## 2019-02-21 NOTE — Anesthesia Procedure Notes (Addendum)
Procedure Name: Intubation Date/Time: 02/21/2019 11:59 AM Performed by: Ancil Boozer, RN Pre-anesthesia Checklist: Patient identified, Emergency Drugs available, Suction available, Patient being monitored and Timeout performed Patient Re-evaluated:Patient Re-evaluated prior to induction Oxygen Delivery Method: Circle system utilized Preoxygenation: Pre-oxygenation with 100% oxygen Induction Type: IV induction Ventilation: Mask ventilation without difficulty Laryngoscope Size: Miller and 2 Grade View: Grade I Tube type: Oral Tube size: 7.0 mm Number of attempts: 1 Airway Equipment and Method: Stylet Placement Confirmation: ETT inserted through vocal cords under direct vision,  CO2 detector,  positive ETCO2 and breath sounds checked- equal and bilateral Secured at: 22 cm Tube secured with: Tape Dental Injury: Teeth and Oropharynx as per pre-operative assessment

## 2019-02-21 NOTE — Anesthesia Post-op Follow-up Note (Signed)
Anesthesia QCDR form completed.        

## 2019-02-21 NOTE — Transfer of Care (Signed)
Immediate Anesthesia Transfer of Care Note  Patient: [name]  Procedure(s) Performed: DEBRIDEMENT WOUND (Left ) APPLICATION OF WOUND VAC (Left )  Patient Location: PACU  Anesthesia Type:General  Level of Consciousness: drowsy and patient cooperative  Airway & Oxygen Therapy: Patient Spontanous Breathing and Patient connected to face mask oxygen  Post-op Assessment: Report given to RN and Post -op Vital signs reviewed and stable  Post vital signs: Reviewed and stable  Last Vitals:  Vitals Value Taken Time  BP 113/73 02/21/2019 12:42 PM  Temp 36.1 C 02/21/2019 12:40 PM  Pulse 91 02/21/2019 12:44 PM  Resp 16 02/21/2019 12:44 PM  SpO2 100 % 02/21/2019 12:44 PM  Vitals shown include unvalidated device data.  Last Pain:  Vitals:   02/21/19 1020  TempSrc: Oral  PainSc: 7          Complications: No apparent anesthesia complications

## 2019-02-21 NOTE — Discharge Instructions (Signed)

## 2019-02-22 ENCOUNTER — Telehealth (INDEPENDENT_AMBULATORY_CARE_PROVIDER_SITE_OTHER): Payer: Self-pay

## 2019-02-22 NOTE — Telephone Encounter (Signed)
Patient called back wanting her prescription for antibiotic and her pain medication called into the pharmacy. I explained that because she has a "live" prescription I could not call the antibiotic into her pharmacy and that the pain medication could not be called in due to it being a narcotic. Patient stated that her other doctor calls her pain medication into her pharmacy, so she has that already. Patient stated she will not take her antibiotic until she comes back into our office and have it called into the pharmacy at her next appt.

## 2019-02-22 NOTE — Telephone Encounter (Signed)
Returned a call to the patient from this morning, patient stated she did not receive discharge paperwork or her prescriptions. Spoke with the patient and she found her paperwork and prescriptions in her wheelchair. Patient wanted to move her appt time to morning. Patient was transferred to the front desk to speak with someone to reschedule the time of her appt.

## 2019-02-25 NOTE — Anesthesia Postprocedure Evaluation (Signed)
Anesthesia Post Note  Patient: [name]  Procedure(s) Performed: DEBRIDEMENT WOUND (Left ) APPLICATION OF WOUND VAC (Left )  Patient location during evaluation: PACU Anesthesia Type: General Level of consciousness: awake and alert and oriented Pain management: pain level controlled Vital Signs Assessment: post-procedure vital signs reviewed and stable Respiratory status: spontaneous breathing Cardiovascular status: blood pressure returned to baseline Anesthetic complications: no     Last Vitals:  Vitals:   02/21/19 1305 02/21/19 1311  BP: 118/65 122/68  Pulse: 91 89  Resp: 18 17  Temp: (!) 36.2 C (!) 36.1 C  SpO2: 97% 98%    Last Pain:  Vitals:   02/22/19 0811  TempSrc:   PainSc: 0-No pain                 Ishi Danser

## 2019-02-26 ENCOUNTER — Telehealth (INDEPENDENT_AMBULATORY_CARE_PROVIDER_SITE_OTHER): Payer: Self-pay | Admitting: Vascular Surgery

## 2019-02-26 NOTE — Telephone Encounter (Signed)
Kristin-Physical Therapist, calling to notify you that the patient fell this morning trying to transfer from chair to bed. There was no injury and the patient is fine. Did have to call EMS to have help getting the patient off the floor. Wound is fine, no injury to stump. AS, CMA

## 2019-02-28 ENCOUNTER — Encounter: Payer: Self-pay | Admitting: Vascular Surgery

## 2019-03-08 ENCOUNTER — Telehealth (INDEPENDENT_AMBULATORY_CARE_PROVIDER_SITE_OTHER): Payer: Self-pay | Admitting: Vascular Surgery

## 2019-03-08 ENCOUNTER — Ambulatory Visit (INDEPENDENT_AMBULATORY_CARE_PROVIDER_SITE_OTHER): Payer: Medicare HMO | Admitting: Vascular Surgery

## 2019-03-08 ENCOUNTER — Other Ambulatory Visit: Payer: Self-pay

## 2019-03-08 ENCOUNTER — Encounter (INDEPENDENT_AMBULATORY_CARE_PROVIDER_SITE_OTHER): Payer: Self-pay | Admitting: Vascular Surgery

## 2019-03-08 VITALS — BP 116/73 | HR 94 | Resp 12 | Ht 65.0 in | Wt 246.0 lb

## 2019-03-08 DIAGNOSIS — Z79899 Other long term (current) drug therapy: Secondary | ICD-10-CM

## 2019-03-08 DIAGNOSIS — Z89512 Acquired absence of left leg below knee: Secondary | ICD-10-CM

## 2019-03-08 DIAGNOSIS — E1142 Type 2 diabetes mellitus with diabetic polyneuropathy: Secondary | ICD-10-CM

## 2019-03-08 DIAGNOSIS — I998 Other disorder of circulatory system: Secondary | ICD-10-CM

## 2019-03-08 NOTE — Assessment & Plan Note (Signed)
Wound is improving.  A wet-to-dry dressing was placed today.  We will continue the [facility] therapy for a couple more weeks and then see her back in the office.  I am going to see how she does off of antibiotics for now.  She will contact our office with any signs of infection.  I would like her wound to close a little more before we start a stump shrinker.

## 2019-03-08 NOTE — Assessment & Plan Note (Signed)
Now s/p amputation

## 2019-03-08 NOTE — Telephone Encounter (Signed)
Patient has been notified. AS, CMA

## 2019-03-08 NOTE — Progress Notes (Signed)
Patient ID: [name] L Zollner, female   DOB: 05/21/1961, 58 y.o.   MRN: [id number]  Chief Complaint  Patient presents with  . Follow-up    HPI [name] is a 58 y.o. female.  Patient returns to check her wound.  It looks good.  She has completed her oral antibiotics.  She has no signs of systemic infection.  There is a little bit of tracking medially but overall there is a good granulation base and the wound is definitely shrinking in size.   Past Medical History:  Diagnosis Date  . Acute pain of right shoulder 05/19/2016  . Acute PN (pyelonephritis) 05/18/2016  . Acute pyelonephritis 05/18/2016  . Anxiety   . Arthritis   . Asthma   . Diabetes mellitus without complication (HCC)   . GERD (gastroesophageal reflux disease)   . Glaucoma   . Hyperlipemia   . Hyperlipemia   . Hypertension   . Peripheral vascular disease [facility](HCC)     Past Surgical History:  Procedure Laterality Date  . AMPUTATION Left 01/07/2019   Procedure: AMPUTATION BELOW KNEE;  Surgeon: Annice Needyew,  S, MD;  Location: [facility] ORS;  Service: Vascular;  Laterality: Left;  . ANTERIOR CRUCIATE LIGAMENT REPAIR    . APPLICATION OF WOUND VAC Left 01/14/2019   Procedure: APPLICATION OF WOUND VAC;  Surgeon: Annice Needyew,  S, MD;  Location: [facility] ORS;  Service: Vascular;  Laterality: Left;  Wound vac [id number]   . APPLICATION OF WOUND VAC Left 02/21/2019   Procedure: APPLICATION OF WOUND VAC;  Surgeon: Annice Needyew,  S, MD;  Location: [facility] ORS;  Service: Vascular;  Laterality: Left;  . FASCIOTOMY Left 01/01/2019   Procedure: FASCIOTOMY LEFT LEG;  Surgeon: Annice Needyew,  S, MD;  Location: [facility] ORS;  Service: Vascular;  Laterality: Left;  . I&D EXTREMITY Left 01/14/2019   Procedure: IRRIGATION AND DEBRIDEMENT EXTREMITY-LEFT BKA;  Surgeon: Annice Needyew,  S, MD;  Location: [facility] ORS;  Service: Vascular;  Laterality: Left;  . LOWER EXTREMITY ANGIOGRAPHY Left 12/17/2018   Procedure: LOWER EXTREMITY ANGIOGRAPHY;  Surgeon: Annice Needyew,  S, MD;  Location: [facility]  INVASIVE CV LAB;  Service: Cardiovascular;  Laterality: Left;  . LOWER EXTREMITY ANGIOGRAPHY Left 12/31/2018   Procedure: Lower Extremity Angiography;  Surgeon: Annice Needyew,  S, MD;  Location: [facility] INVASIVE CV LAB;  Service: Cardiovascular;  Laterality: Left;  . LOWER EXTREMITY ANGIOGRAPHY Left 01/01/2019   Procedure: Lower Extremity Angiography;  Surgeon: Annice Needyew,  S, MD;  Location: [facility] INVASIVE CV LAB;  Service: Cardiovascular;  Laterality: Left;  . WOUND DEBRIDEMENT Left 02/21/2019   Procedure: DEBRIDEMENT WOUND;  Surgeon: Annice Needyew,  S, MD;  Location: [facility] ORS;  Service: Vascular;  Laterality: Left;      Allergies  Allergen Reactions  . Biaxin [Clarithromycin] Rash    Patient states this medication gives her severe rash and thrush in the mouth.    Current Outpatient Medications  Medication Sig Dispense Refill  . albuterol (PROAIR HFA) 108 (90 Base) MCG/ACT inhaler Inhale 2 puffs into the lungs every 6 (six) hours as needed (wheezing/shortness of breath).     Marland Kitchen. atorvastatin (LIPITOR) 20 MG tablet Take 20 mg by mouth at bedtime.  12  . clonazepam (KLONOPIN) 0.25 MG disintegrating tablet Take 1 tablet (0.25 mg total) by mouth 2 (two) times daily as needed (Anxiety). 60 tablet 5  . clopidogrel (PLAVIX) 75 MG tablet Take 75 mg by mouth at bedtime.    . furosemide (LASIX) 20 MG tablet Take 1 tablet (20 mg total) by mouth  daily. 30 tablet 0  . gabapentin (NEURONTIN) 600 MG tablet Take 600 mg by mouth 3 (three) times daily.    . insulin aspart (NOVOLOG) 100 UNIT/ML injection Inject 4 Units into the skin 3 (three) times daily with meals. (Patient taking differently: Inject 10 Units into the skin 3 (three) times daily with meals. ) 10 mL 11  . insulin glargine (LANTUS) 100 UNIT/ML injection Inject 0.35 mLs (35 Units total) into the skin daily. (Patient taking differently: Inject 35 Units into the skin at bedtime. ) 10 mL 11  . ipratropium-albuterol (DUONEB) 0.5-2.5 (3) MG/3ML SOLN Take 3 mLs by  nebulization every 4 (four) hours as needed. 360 mL 0  . latanoprost (XALATAN) 0.005 % ophthalmic solution Place 1 drop into both eyes at bedtime. 2.5 mL 0  . metFORMIN (GLUCOPHAGE-XR) 500 MG 24 hr tablet Take 1 tablet by mouth 2 (two) times daily.  2  . metoprolol tartrate (LOPRESSOR) 25 MG tablet Take 1 tablet (25 mg total) by mouth 2 (two) times daily. 60 tablet 0  . nortriptyline (PAMELOR) 50 MG capsule Take 1 capsule (50 mg total) by mouth at bedtime. 90 capsule 0  . oxyCODONE-acetaminophen (PERCOCET) 10-325 MG tablet Take 1 tablet by mouth 3 (three) times daily. 30 tablet 0  . pantoprazole (PROTONIX) 20 MG tablet Take 20 mg by mouth daily before breakfast.    . acetaminophen (TYLENOL) 325 MG tablet Take 2 tablets (650 mg total) by mouth every 6 (six) hours as needed for mild pain (or Fever >/= 101). (Patient not taking: Reported on 02/18/2019)    . acetaminophen (TYLENOL) 650 MG suppository Place 1 suppository (650 mg total) rectally every 6 (six) hours as needed for mild pain (or Fever >/= 101). (Patient not taking: Reported on 02/18/2019) 12 suppository 0  . aspirin EC 81 MG tablet Take 1 tablet (81 mg total) by mouth daily. (Patient not taking: Reported on 02/18/2019) 150 tablet 2  . bisacodyl (DULCOLAX) 5 MG EC tablet Take 1 tablet (5 mg total) by mouth daily as needed for moderate constipation. (Patient not taking: Reported on 02/18/2019) 30 tablet 0  . cephALEXin (KEFLEX) 500 MG capsule Take 1 capsule (500 mg total) by mouth 3 (three) times daily. (Patient not taking: Reported on 03/08/2019) 30 capsule 0  . loperamide (IMODIUM) 2 MG capsule Take 1 capsule (2 mg total) by mouth every 6 (six) hours as needed for diarrhea or loose stools. (Patient not taking: Reported on 02/18/2019) 30 capsule 0  . multivitamin-lutein (OCUVITE-LUTEIN) CAPS capsule Take 1 capsule by mouth daily. (Patient not taking: Reported on 03/08/2019) 30 capsule 11  . nicotine (NICODERM CQ - DOSED IN MG/24 HOURS) 14 mg/24hr patch  Place 1 patch (14 mg total) onto the skin daily. (Patient not taking: Reported on 02/18/2019) 28 patch 0  . nicotine polacrilex (NICORETTE) 4 MG gum Take 1 each (4 mg total) by mouth as needed for smoking cessation. (Patient not taking: Reported on 03/08/2019) 100 tablet 0  . ondansetron (ZOFRAN) 4 MG/2ML SOLN injection Inject 2 mLs (4 mg total) into the vein every 6 (six) hours as needed for nausea or vomiting. (Patient not taking: Reported on 02/18/2019) 2 mL 0  . oxyCODONE (OXY IR/ROXICODONE) 5 MG immediate release tablet Take 1-2 tablets (5-10 mg total) by mouth every 4 (four) hours as needed for moderate pain. (Patient not taking: Reported on 03/08/2019) 40 tablet 0  . senna (SENOKOT) 8.6 MG TABS tablet Take 1 tablet (8.6 mg total) by mouth daily as  needed for mild constipation. (Patient not taking: Reported on 02/18/2019) 120 each 0  . sodium chloride 1 g tablet Take 2 tablets (2 g total) by mouth 2 (two) times daily with a meal. (Patient not taking: Reported on 02/18/2019) 60 tablet 0   No current facility-administered medications for this visit.         Physical Exam BP 116/73 (BP Location: Left Arm, Patient Position: Sitting, Cuff Size: Large)   Pulse 94   Resp 12   Ht 5\' 5"  (1.651 m)   Wt 246 lb (111.6 kg)   BMI 40.94 kg/m  Gen:  WD/WN, NAD Skin: Left BKA wound as above.  Improving without signs of infection.     Assessment/Plan:  Diabetic peripheral neuropathy (HCC) (Location of Primary Source of Pain) (Bilateral) (R>L) blood glucose control important in reducing the progression of atherosclerotic disease. Also, involved in wound healing. On appropriate medications.   Ischemic leg Now s/p amputation  Hx of BKA, left (HCC) Wound is improving.  A wet-to-dry dressing was placed today.  We will continue the [facility] therapy for a couple more weeks and then see her back in the office.  I am going to see how she does off of antibiotics for now.  She will contact our office with any  signs of infection.  I would like her wound to close a little more before we start a stump shrinker.      Festus Barren 03/08/2019, 11:30 AM   This note was created with Dragon medical transcription system.  Any errors from dictation are unintentional.

## 2019-03-08 NOTE — Telephone Encounter (Signed)
Patient calling asking if she can discontinue plavix for 10 days for a dental procedure. Please advise. AS, CMA

## 2019-03-08 NOTE — Assessment & Plan Note (Signed)
blood glucose control important in reducing the progression of atherosclerotic disease. Also, involved in wound healing. On appropriate medications.  

## 2019-03-22 ENCOUNTER — Encounter (INDEPENDENT_AMBULATORY_CARE_PROVIDER_SITE_OTHER): Payer: Self-pay | Admitting: Vascular Surgery

## 2019-03-22 ENCOUNTER — Other Ambulatory Visit: Payer: Self-pay

## 2019-03-22 ENCOUNTER — Ambulatory Visit (INDEPENDENT_AMBULATORY_CARE_PROVIDER_SITE_OTHER): Payer: Medicare HMO | Admitting: Vascular Surgery

## 2019-03-22 VITALS — BP 120/74 | HR 99 | Resp 17

## 2019-03-22 DIAGNOSIS — I998 Other disorder of circulatory system: Secondary | ICD-10-CM

## 2019-03-22 DIAGNOSIS — Z89512 Acquired absence of left leg below knee: Secondary | ICD-10-CM

## 2019-03-22 DIAGNOSIS — E1142 Type 2 diabetes mellitus with diabetic polyneuropathy: Secondary | ICD-10-CM

## 2019-03-22 NOTE — Progress Notes (Signed)
Patient ID: [name], female   DOB: 12-14-1961, 58 y.o.   MRN: [id number]  Chief Complaint  Patient presents with  . Follow-up    2week follow up    HPI [name] is a 58 y.o. female.  Patient returns in follow-up of her left BKA wound.  It is doing quite well.  The medial portion of the wound is basically healed at this point.  The lateral wound has a small amount of tracking superiorly at its medial edge but there is beefy granulation tissue and no sign of infection.  The wound is about half the size it was at her last visit.   Past Medical History:  Diagnosis Date  . Acute pain of right shoulder 05/19/2016  . Acute PN (pyelonephritis) 05/18/2016  . Acute pyelonephritis 05/18/2016  . Anxiety   . Arthritis   . Asthma   . Diabetes mellitus without complication (HCC)   . GERD (gastroesophageal reflux disease)   . Glaucoma   . Hyperlipemia   . Hyperlipemia   . Hypertension   . Peripheral vascular disease [facility])     Past Surgical History:  Procedure Laterality Date  . AMPUTATION Left 01/07/2019   Procedure: AMPUTATION BELOW KNEE;  Surgeon: Annice Needy, MD;  Location: [facility] ORS;  Service: Vascular;  Laterality: Left;  . ANTERIOR CRUCIATE LIGAMENT REPAIR    . APPLICATION OF WOUND VAC Left 01/14/2019   Procedure: APPLICATION OF WOUND VAC;  Surgeon: Annice Needy, MD;  Location: [facility] ORS;  Service: Vascular;  Laterality: Left;  Wound vac [id number]   . APPLICATION OF WOUND VAC Left 02/21/2019   Procedure: APPLICATION OF WOUND VAC;  Surgeon: Annice Needy, MD;  Location: [facility] ORS;  Service: Vascular;  Laterality: Left;  . FASCIOTOMY Left 01/01/2019   Procedure: FASCIOTOMY LEFT LEG;  Surgeon: Annice Needy, MD;  Location: [facility] ORS;  Service: Vascular;  Laterality: Left;  . I&D EXTREMITY Left 01/14/2019   Procedure: IRRIGATION AND DEBRIDEMENT EXTREMITY-LEFT BKA;  Surgeon: Annice Needy, MD;  Location: [facility] ORS;  Service: Vascular;  Laterality: Left;  . LOWER EXTREMITY ANGIOGRAPHY Left  12/17/2018   Procedure: LOWER EXTREMITY ANGIOGRAPHY;  Surgeon: Annice Needy, MD;  Location: [facility] INVASIVE CV LAB;  Service: Cardiovascular;  Laterality: Left;  . LOWER EXTREMITY ANGIOGRAPHY Left 12/31/2018   Procedure: Lower Extremity Angiography;  Surgeon: Annice Needy, MD;  Location: [facility] INVASIVE CV LAB;  Service: Cardiovascular;  Laterality: Left;  . LOWER EXTREMITY ANGIOGRAPHY Left 01/01/2019   Procedure: Lower Extremity Angiography;  Surgeon: Annice Needy, MD;  Location: [facility] INVASIVE CV LAB;  Service: Cardiovascular;  Laterality: Left;  . WOUND DEBRIDEMENT Left 02/21/2019   Procedure: DEBRIDEMENT WOUND;  Surgeon: Annice Needy, MD;  Location: [facility] ORS;  Service: Vascular;  Laterality: Left;      Allergies  Allergen Reactions  . Biaxin [Clarithromycin] Rash    Patient states this medication gives her severe rash and thrush in the mouth.    Current Outpatient Medications  Medication Sig Dispense Refill  . albuterol (PROAIR HFA) 108 (90 Base) MCG/ACT inhaler Inhale 2 puffs into the lungs every 6 (six) hours as needed (wheezing/shortness of breath).     Marland Kitchen atorvastatin (LIPITOR) 20 MG tablet Take 20 mg by mouth at bedtime.  12  . clonazepam (KLONOPIN) 0.25 MG disintegrating tablet Take 1 tablet (0.25 mg total) by mouth 2 (two) times daily as needed (Anxiety). 60 tablet 5  . clopidogrel (PLAVIX) 75 MG tablet  Take 75 mg by mouth at bedtime.    . furosemide (LASIX) 20 MG tablet Take 1 tablet (20 mg total) by mouth daily. 30 tablet 0  . gabapentin (NEURONTIN) 600 MG tablet Take 600 mg by mouth 3 (three) times daily.    . insulin aspart (NOVOLOG) 100 UNIT/ML injection Inject 4 Units into the skin 3 (three) times daily with meals. (Patient taking differently: Inject 10 Units into the skin 3 (three) times daily with meals. ) 10 mL 11  . insulin glargine (LANTUS) 100 UNIT/ML injection Inject 0.35 mLs (35 Units total) into the skin daily. (Patient taking differently: Inject 35 Units into the skin at  bedtime. ) 10 mL 11  . ipratropium-albuterol (DUONEB) 0.5-2.5 (3) MG/3ML SOLN Take 3 mLs by nebulization every 4 (four) hours as needed. 360 mL 0  . latanoprost (XALATAN) 0.005 % ophthalmic solution Place 1 drop into both eyes at bedtime. 2.5 mL 0  . metFORMIN (GLUCOPHAGE-XR) 500 MG 24 hr tablet Take 1 tablet by mouth 2 (two) times daily.  2  . metoprolol tartrate (LOPRESSOR) 25 MG tablet Take 1 tablet (25 mg total) by mouth 2 (two) times daily. 60 tablet 0  . nortriptyline (PAMELOR) 50 MG capsule Take 1 capsule (50 mg total) by mouth at bedtime. 90 capsule 0  . oxyCODONE-acetaminophen (PERCOCET) 10-325 MG tablet Take 1 tablet by mouth 3 (three) times daily. 30 tablet 0  . pantoprazole (PROTONIX) 20 MG tablet Take 20 mg by mouth daily before breakfast.    . acetaminophen (TYLENOL) 325 MG tablet Take 2 tablets (650 mg total) by mouth every 6 (six) hours as needed for mild pain (or Fever >/= 101). (Patient not taking: Reported on 02/18/2019)    . acetaminophen (TYLENOL) 650 MG suppository Place 1 suppository (650 mg total) rectally every 6 (six) hours as needed for mild pain (or Fever >/= 101). (Patient not taking: Reported on 02/18/2019) 12 suppository 0  . aspirin EC 81 MG tablet Take 1 tablet (81 mg total) by mouth daily. (Patient not taking: Reported on 02/18/2019) 150 tablet 2  . bisacodyl (DULCOLAX) 5 MG EC tablet Take 1 tablet (5 mg total) by mouth daily as needed for moderate constipation. (Patient not taking: Reported on 02/18/2019) 30 tablet 0  . cephALEXin (KEFLEX) 500 MG capsule Take 1 capsule (500 mg total) by mouth 3 (three) times daily. (Patient not taking: Reported on 03/08/2019) 30 capsule 0  . loperamide (IMODIUM) 2 MG capsule Take 1 capsule (2 mg total) by mouth every 6 (six) hours as needed for diarrhea or loose stools. (Patient not taking: Reported on 02/18/2019) 30 capsule 0  . multivitamin-lutein (OCUVITE-LUTEIN) CAPS capsule Take 1 capsule by mouth daily. (Patient not taking: Reported  on 03/08/2019) 30 capsule 11  . nicotine (NICODERM CQ - DOSED IN MG/24 HOURS) 14 mg/24hr patch Place 1 patch (14 mg total) onto the skin daily. (Patient not taking: Reported on 02/18/2019) 28 patch 0  . nicotine polacrilex (NICORETTE) 4 MG gum Take 1 each (4 mg total) by mouth as needed for smoking cessation. (Patient not taking: Reported on 03/08/2019) 100 tablet 0  . ondansetron (ZOFRAN) 4 MG/2ML SOLN injection Inject 2 mLs (4 mg total) into the vein every 6 (six) hours as needed for nausea or vomiting. (Patient not taking: Reported on 02/18/2019) 2 mL 0  . oxyCODONE (OXY IR/ROXICODONE) 5 MG immediate release tablet Take 1-2 tablets (5-10 mg total) by mouth every 4 (four) hours as needed for moderate pain. (Patient not taking:  Reported on 03/08/2019) 40 tablet 0  . senna (SENOKOT) 8.6 MG TABS tablet Take 1 tablet (8.6 mg total) by mouth daily as needed for mild constipation. (Patient not taking: Reported on 02/18/2019) 120 each 0  . sodium chloride 1 g tablet Take 2 tablets (2 g total) by mouth 2 (two) times daily with a meal. (Patient not taking: Reported on 02/18/2019) 60 tablet 0   No current facility-administered medications for this visit.         Physical Exam BP 120/74 (BP Location: Right Arm)   Pulse 99   Resp 17  Gen:  WD/WN, NAD Skin: incision  As above, no signs of infection     Assessment/Plan: Diabetic peripheral neuropathy (HCC) (Location of Primary Source of Pain) (Bilateral) (R>L) blood glucose control important in reducing the progression of atherosclerotic disease. Also, involved in wound healing. On appropriate medications.   Ischemic leg Now s/p amputation  Hx of BKA, left (HCC) Wound continues to improve.  There is a bit of tracking at the medial portion of the lateral wound so either attention to this with a wet-to-dry dressing or continued VAC with packing of the sponge into the wound would be appropriate.  I expect this will take 1 to 2 weeks to remove the  tracking.  I will see her back in 3 to 4 weeks at which time the wound should be close to completely healed.      Festus Barren 03/22/2019, 9:40 AM   This note was created with Dragon medical transcription system.  Any errors from dictation are unintentional.

## 2019-03-22 NOTE — Assessment & Plan Note (Signed)
Wound continues to improve.  There is a bit of tracking at the medial portion of the lateral wound so either attention to this with a wet-to-dry dressing or continued VAC with packing of the sponge into the wound would be appropriate.  I expect this will take 1 to 2 weeks to remove the tracking.  I will see her back in 3 to 4 weeks at which time the wound should be close to completely healed.

## 2019-03-26 ENCOUNTER — Telehealth (INDEPENDENT_AMBULATORY_CARE_PROVIDER_SITE_OTHER): Payer: Self-pay

## 2019-03-26 NOTE — Telephone Encounter (Signed)
Missy Hilton from [facility] left a message stating the patient refused Occupational Therapy due to noncompliant

## 2019-04-15 ENCOUNTER — Telehealth (INDEPENDENT_AMBULATORY_CARE_PROVIDER_SITE_OTHER): Payer: Self-pay

## 2019-04-15 NOTE — Telephone Encounter (Signed)
Chad (PT) from [employer] called requesting to extend treatment for twice a week for 9 weeks and I spoke with Vivia Birmingham NP and she approve the request

## 2019-04-19 ENCOUNTER — Ambulatory Visit (INDEPENDENT_AMBULATORY_CARE_PROVIDER_SITE_OTHER): Payer: Medicare HMO | Admitting: Nurse Practitioner

## 2019-04-26 ENCOUNTER — Other Ambulatory Visit: Payer: Self-pay

## 2019-04-26 ENCOUNTER — Encounter (INDEPENDENT_AMBULATORY_CARE_PROVIDER_SITE_OTHER): Payer: Self-pay | Admitting: Nurse Practitioner

## 2019-04-26 ENCOUNTER — Ambulatory Visit (INDEPENDENT_AMBULATORY_CARE_PROVIDER_SITE_OTHER): Payer: Medicare HMO | Admitting: Nurse Practitioner

## 2019-04-26 VITALS — BP 116/74 | HR 102 | Resp 16

## 2019-04-26 DIAGNOSIS — Z89512 Acquired absence of left leg below knee: Secondary | ICD-10-CM | POA: Diagnosis not present

## 2019-04-26 DIAGNOSIS — T8130XA Disruption of wound, unspecified, initial encounter: Secondary | ICD-10-CM

## 2019-04-26 DIAGNOSIS — Z794 Long term (current) use of insulin: Secondary | ICD-10-CM

## 2019-04-26 DIAGNOSIS — Z7984 Long term (current) use of oral hypoglycemic drugs: Secondary | ICD-10-CM

## 2019-04-26 DIAGNOSIS — N183 Chronic kidney disease, stage 3 unspecified: Secondary | ICD-10-CM

## 2019-04-26 DIAGNOSIS — E1122 Type 2 diabetes mellitus with diabetic chronic kidney disease: Secondary | ICD-10-CM | POA: Diagnosis not present

## 2019-04-26 MED ORDER — CEPHALEXIN 500 MG PO CAPS: 500.0000 mg | ORAL_CAPSULE | Freq: Two times a day (BID) | ORAL | 0 refills | Status: DC

## 2019-04-26 NOTE — Progress Notes (Signed)
SUBJECTIVE:  Patient ID: [name], female    DOB: November 17, 1961, 58 y.o.   MRN: [id number] Chief Complaint  Patient presents with  . Follow-up    4week wound check    HPI  [name] is a [age] y.o. female that presents today for evaluation of her left lower extremity amputation site.  Her wound appears markedly better after debridement and wound vac placement.  Currently home health has been using calcium alginate on her wound.  It is very shallow at this point.  She states that it recently opened with some milky drainage.  There is no tunneling present.  She denies any fever, chills, nausea or vomiting. She is eager to get her shrinker sock for prosthesis.    Past Medical History:  Diagnosis Date  . Acute pain of right shoulder 05/19/2016  . Acute PN (pyelonephritis) 05/18/2016  . Acute pyelonephritis 05/18/2016  . Anxiety   . Arthritis   . Asthma   . Diabetes mellitus without complication (HCC)   . GERD (gastroesophageal reflux disease)   . Glaucoma   . Hyperlipemia   . Hyperlipemia   . Hypertension   . Peripheral vascular disease [facility](HCC)     Past Surgical History:  Procedure Laterality Date  . AMPUTATION Left 01/07/2019   Procedure: AMPUTATION BELOW KNEE;  Surgeon: Annice Needyew, Jason S, MD;  Location: [facility] ORS;  Service: Vascular;  Laterality: Left;  . ANTERIOR CRUCIATE LIGAMENT REPAIR    . APPLICATION OF WOUND VAC Left 01/14/2019   Procedure: APPLICATION OF WOUND VAC;  Surgeon: Annice Needyew, Jason S, MD;  Location: [facility] ORS;  Service: Vascular;  Laterality: Left;  Wound vac [id number]   . APPLICATION OF WOUND VAC Left 02/21/2019   Procedure: APPLICATION OF WOUND VAC;  Surgeon: Annice Needyew, Jason S, MD;  Location: [facility] ORS;  Service: Vascular;  Laterality: Left;  . FASCIOTOMY Left 01/01/2019   Procedure: FASCIOTOMY LEFT LEG;  Surgeon: Annice Needyew, Jason S, MD;  Location: [facility] ORS;  Service: Vascular;  Laterality: Left;  . I&D EXTREMITY Left 01/14/2019   Procedure: IRRIGATION AND DEBRIDEMENT EXTREMITY-LEFT BKA;   Surgeon: Annice Needyew, Jason S, MD;  Location: [facility] OR[facility];  Service: Vascular;  Laterality: Left;  . LOWER EXTREMITY ANGIOGRAPHY Left 12/17/2018   Procedure: LOWER EXTREMITY ANGIOGRAPHY;  Surgeon: Annice Needyew, Jason S, MD;  Location: [facility] IN[facility]IVE [facility];  Service: Cardiovascular;  Laterality: Left;  . LOWER EXTREMITY ANGIOGRAPHY Left 12/31/2018   Procedure: Lower Extremity Angiography;  Surgeon: Annice Needyew, Jason S, MD;  Location: [facility];  Service: Cardiovascular;  Laterality: Left;  . LOWER EXTREMITY ANGIOGRAPHY Left 01/01/2019   Procedure: Lower Extremity Angiography;  Surgeon: Annice Needyew, Jason S, MD;  Location: [facility];  Service: Cardiovascular;  Laterality: Left;  . WOUND DEBRIDEMENT Left 02/21/2019   Procedure: DEBRIDEMENT WOUND;  Surgeon: Annice Needyew, Jason S, MD;  Location: [facility] OR[facility];  Service: Vascular;  Laterality: Left;    Social History   Socioeconomic History  . Marital status: Divorced    Spouse name: Not on file  . Number of children: Not on file  . Years of education: Not on file  . Highest education level: Not on file  Occupational History  . Not on file  Social Needs  . Financial resource strain: Patient refused  . Food insecurity:    Worry: Patient refused    Inability: Patient refused  . Transportation needs:    Medical: Patient refused    Non-medical: Patient refused  Tobacco Use  . Smoking status: Former  Smoker    Packs/day: 0.50    Start date: 01/18/2019  . Smokeless tobacco: Never Used  Substance and Sexual Activity  . Alcohol use: No  . Drug use: Never  . Sexual activity: Not on file  Lifestyle  . Physical activity:    Days per week: Patient refused    Minutes per session: Patient refused  . Stress: Patient refused  Relationships  . Social connections:    Talks on phone: Patient refused    Gets together: Patient refused    Attends religious service: Patient refused    Active member of club or organization: Patient refused    Attends meetings of clubs or  organizations: Patient refused    Relationship status: Patient refused  . Intimate partner violence:    Fear of current or ex partner: Patient refused    Emotionally abused: Patient refused    Physically abused: Patient refused    Forced sexual activity: Patient refused  Other Topics Concern  . Not on file  Social History Narrative  . Not on file    Family History  Problem Relation Age of Onset  . Diabetes Mother   . Heart disease Mother   . Stroke Mother   . Kidney cancer Neg Hx   . Prostate cancer Neg Hx     Allergies  Allergen Reactions  . Biaxin [Clarithromycin] Rash    Patient states this medication gives her severe rash and thrush in the mouth.     Review of Systems   Review of Systems: Negative Unless Checked Constitutional: [] Weight loss  [] Fever  [] Chills Cardiac: [] Chest pain   []  Atrial Fibrillation  [] Palpitations   [] Shortness of breath when laying flat   [] Shortness of breath with exertion. [] Shortness of breath at rest Vascular:  [] Pain in legs with walking   [] Pain in legs with standing [] Pain in legs when laying flat   [] Claudication    [] Pain in feet when laying flat    [] History of DVT   [] Phlebitis   [x] Swelling in legs   [] Varicose veins   [] Non-healing ulcers Pulmonary:   [] Uses home oxygen   [] Productive cough   [] Hemoptysis   [] Wheeze  [] COPD   [] Asthma Neurologic:  [] Dizziness   [] Seizures  [] Blackouts [] History of stroke   [] History of TIA  [] Aphasia   [] Temporary Blindness   [] Weakness or numbness in arm   [] Weakness or numbness in leg Musculoskeletal:   [] Joint swelling   [] Joint pain   [] Low back pain  []  History of Knee Replacement [] Arthritis [] back Surgeries  []  Spinal Stenosis    Hematologic:  [] Easy bruising  [] Easy bleeding   [] Hypercoagulable state   [x] Anemic Gastrointestinal:  [] Diarrhea   [] Vomiting  [] Gastroesophageal reflux/heartburn   [] Difficulty swallowing. [] Abdominal pain Genitourinary:  [x] Chronic kidney disease   [] Difficult  urination  [] Anuric   [] Blood in urine [] Frequent urination  [] Burning with urination   [] Hematuria Skin:  [] Rashes   [] Ulcers [x] Wounds Psychological:  [] History of anxiety   []  History of major depression  []  Memory Difficulties      OBJECTIVE:   Physical Exam  BP 116/74 (BP Location: Left Arm)   Pulse (!) 102   Resp 16   Gen: WD/WN, NAD Head: [place]/AT, No temporalis wasting.  Ear/Nose/Throat: Hearing grossly intact, nares w/o erythema or drainage Eyes: PER, EOMI, sclera nonicteric.  Neck: Supple, no masses.  No JVD.  Pulmonary:  Good air movement, no use of accessory muscles.  Cardiac: RRR Vascular:  superficial wounds with  purulent drainage on L BKA  Vessel Right Left  Radial Palpable Palpable   Gastrointestinal: soft, non-distended. No guarding/no peritoneal signs.  Musculoskeletal: M/S 5/5 throughout. L BKA   Neurologic: Pain and light touch intact in extremities.  Symmetrical.  Speech is fluent. Motor exam as listed above. Psychiatric: Judgment intact, Mood & affect appropriate for pt's clinical situation. Dermatologic: No Venous rashes. No Ulcers Noted.  No changes consistent with cellulitis. Lymph : No Cervical lymphadenopathy, no lichenification or skin changes of chronic lymphedema.       ASSESSMENT AND PLAN:  1. Wound disruption, initial encounter There is a small amount of purulent drainage present.  We will do a 10 day course to clear up the drainage and facilitate healing.   - cephALEXin (KEFLEX) 500 MG capsule; Take 1 capsule (500 mg total) by mouth 2 (two) times daily.  Dispense: 20 capsule; Refill: 0  2. Type 2 diabetes mellitus with stage 3 chronic kidney disease, with long-term current use of insulin (HCC) Continue hypoglycemic medications as already ordered, these medications have been reviewed and there are no changes at this time.  Hgb A1C to be monitored as already arranged by primary service   3. Hx of BKA, left (HCC) Overall, the leg is healing  very well.  I anticipate that with continued use of calcium alginate the wound should be healed within the next four weeks.  We will place an order for referral for the shrinker sock as well as for prosthesis.  Patient will follow up in office in four weeks.    Current Outpatient Medications on File Prior to Visit  Medication Sig Dispense Refill  . albuterol (PROAIR HFA) 108 (90 Base) MCG/ACT inhaler Inhale 2 puffs into the lungs every 6 (six) hours as needed (wheezing/shortness of breath).     Marland Kitchen. atorvastatin (LIPITOR) 20 MG tablet Take 20 mg by mouth at bedtime.  12  . clonazepam (KLONOPIN) 0.25 MG disintegrating tablet Take 1 tablet (0.25 mg total) by mouth 2 (two) times daily as needed (Anxiety). 60 tablet 5  . clopidogrel (PLAVIX) 75 MG tablet Take 75 mg by mouth at bedtime.    . furosemide (LASIX) 20 MG tablet Take 1 tablet (20 mg total) by mouth daily. 30 tablet 0  . gabapentin (NEURONTIN) 600 MG tablet Take 600 mg by mouth 3 (three) times daily.    . insulin aspart (NOVOLOG) 100 UNIT/ML injection Inject 4 Units into the skin 3 (three) times daily with meals. (Patient taking differently: Inject 10 Units into the skin 3 (three) times daily with meals. ) 10 mL 11  . insulin glargine (LANTUS) 100 UNIT/ML injection Inject 0.35 mLs (35 Units total) into the skin daily. (Patient taking differently: Inject 35 Units into the skin at bedtime. ) 10 mL 11  . ipratropium-albuterol (DUONEB) 0.5-2.5 (3) MG/3ML SOLN Take 3 mLs by nebulization every 4 (four) hours as needed. 360 mL 0  . latanoprost (XALATAN) 0.005 % ophthalmic solution Place 1 drop into both eyes at bedtime. 2.5 mL 0  . metFORMIN (GLUCOPHAGE-XR) 500 MG 24 hr tablet Take 1 tablet by mouth 2 (two) times daily.  2  . metoprolol tartrate (LOPRESSOR) 25 MG tablet Take 1 tablet (25 mg total) by mouth 2 (two) times daily. 60 tablet 0  . nortriptyline (PAMELOR) 50 MG capsule Take 1 capsule (50 mg total) by mouth at bedtime. 90 capsule 0  .  oxyCODONE-acetaminophen (PERCOCET) 10-325 MG tablet Take 1 tablet by mouth 3 (three) times daily. 30 tablet  0  . pantoprazole (PROTONIX) 20 MG tablet Take 20 mg by mouth daily before breakfast.    . acetaminophen (TYLENOL) 325 MG tablet Take 2 tablets (650 mg total) by mouth every 6 (six) hours as needed for mild pain (or Fever >/= 101). (Patient not taking: Reported on 02/18/2019)    . acetaminophen (TYLENOL) 650 MG suppository Place 1 suppository (650 mg total) rectally every 6 (six) hours as needed for mild pain (or Fever >/= 101). (Patient not taking: Reported on 02/18/2019) 12 suppository 0  . aspirin EC 81 MG tablet Take 1 tablet (81 mg total) by mouth daily. (Patient not taking: Reported on 02/18/2019) 150 tablet 2  . bisacodyl (DULCOLAX) 5 MG EC tablet Take 1 tablet (5 mg total) by mouth daily as needed for moderate constipation. (Patient not taking: Reported on 02/18/2019) 30 tablet 0  . cephALEXin (KEFLEX) 500 MG capsule Take 1 capsule (500 mg total) by mouth 3 (three) times daily. (Patient not taking: Reported on 03/08/2019) 30 capsule 0  . loperamide (IMODIUM) 2 MG capsule Take 1 capsule (2 mg total) by mouth every 6 (six) hours as needed for diarrhea or loose stools. (Patient not taking: Reported on 02/18/2019) 30 capsule 0  . multivitamin-lutein (OCUVITE-LUTEIN) CAPS capsule Take 1 capsule by mouth daily. (Patient not taking: Reported on 03/08/2019) 30 capsule 11  . nicotine (NICODERM CQ - DOSED IN MG/24 HOURS) 14 mg/24hr patch Place 1 patch (14 mg total) onto the skin daily. (Patient not taking: Reported on 02/18/2019) 28 patch 0  . nicotine polacrilex (NICORETTE) 4 MG gum Take 1 each (4 mg total) by mouth as needed for smoking cessation. (Patient not taking: Reported on 03/08/2019) 100 tablet 0  . ondansetron (ZOFRAN) 4 MG/2ML SOLN injection Inject 2 mLs (4 mg total) into the vein every 6 (six) hours as needed for nausea or vomiting. (Patient not taking: Reported on 02/18/2019) 2 mL 0  . oxyCODONE  (OXY IR/ROXICODONE) 5 MG immediate release tablet Take 1-2 tablets (5-10 mg total) by mouth every 4 (four) hours as needed for moderate pain. (Patient not taking: Reported on 03/08/2019) 40 tablet 0  . senna (SENOKOT) 8.6 MG TABS tablet Take 1 tablet (8.6 mg total) by mouth daily as needed for mild constipation. (Patient not taking: Reported on 02/18/2019) 120 each 0  . sodium chloride 1 g tablet Take 2 tablets (2 g total) by mouth 2 (two) times daily with a meal. (Patient not taking: Reported on 02/18/2019) 60 tablet 0   No current facility-administered medications on file prior to visit.     There are no Patient Instructions on file for this visit. No follow-ups on file.   Georgiana Spinner, NP  This note was completed with Office manager.  Any errors are purely unintentional.

## 2019-05-09 ENCOUNTER — Telehealth (INDEPENDENT_AMBULATORY_CARE_PROVIDER_SITE_OTHER): Payer: Self-pay | Admitting: Nurse Practitioner

## 2019-05-09 NOTE — Telephone Encounter (Signed)
Patient called and said that [name] from [facility] was coming to her house today to fit her for her prosthetic and shrinker sock. States he has no received referral. I have filled out a new referral and faxed it to [facility] at [phone]. I have the fax confirmation. Also, called patient to let her know this has been done. AS, CMA

## 2019-05-24 ENCOUNTER — Other Ambulatory Visit: Payer: Self-pay

## 2019-05-24 ENCOUNTER — Ambulatory Visit (INDEPENDENT_AMBULATORY_CARE_PROVIDER_SITE_OTHER): Payer: Medicare HMO | Admitting: Vascular Surgery

## 2019-05-24 ENCOUNTER — Encounter (INDEPENDENT_AMBULATORY_CARE_PROVIDER_SITE_OTHER): Payer: Self-pay | Admitting: Vascular Surgery

## 2019-05-24 VITALS — BP 94/64 | HR 64 | Resp 17

## 2019-05-24 DIAGNOSIS — F17211 Nicotine dependence, cigarettes, in remission: Secondary | ICD-10-CM

## 2019-05-24 DIAGNOSIS — I739 Peripheral vascular disease, unspecified: Secondary | ICD-10-CM | POA: Diagnosis not present

## 2019-05-24 DIAGNOSIS — E1142 Type 2 diabetes mellitus with diabetic polyneuropathy: Secondary | ICD-10-CM | POA: Diagnosis not present

## 2019-05-24 DIAGNOSIS — I998 Other disorder of circulatory system: Secondary | ICD-10-CM | POA: Diagnosis not present

## 2019-05-24 DIAGNOSIS — Z794 Long term (current) use of insulin: Secondary | ICD-10-CM

## 2019-05-24 DIAGNOSIS — Z89512 Acquired absence of left leg below knee: Secondary | ICD-10-CM

## 2019-05-24 NOTE — Assessment & Plan Note (Signed)
Check right ABI in 2-3 months.

## 2019-05-24 NOTE — Assessment & Plan Note (Signed)
Wound is finally healed and now she can begin her prosthesis placement.  Already seeing [facility].

## 2019-05-24 NOTE — Progress Notes (Signed)
MRN : [id number]  [name] is a 58 y.o. (1961/04/18) female who presents with chief complaint of  Chief Complaint  Patient presents with  . Follow-up  .  History of Present Illness: Patient returns today in follow up of her left BKA.  It has been a long process, but her wound is finally healed.  He is wearing her stump shrinker and has met with the prosthetists who has begun work on her left below-knee amputation prosthesis.  Her pain is basically resolved and she is doing well.  Current Outpatient Medications  Medication Sig Dispense Refill  . albuterol (PROAIR HFA) 108 (90 Base) MCG/ACT inhaler Inhale 2 puffs into the lungs every 6 (six) hours as needed (wheezing/shortness of breath).     Marland Kitchen atorvastatin (LIPITOR) 20 MG tablet Take 20 mg by mouth at bedtime.  12  . cephALEXin (KEFLEX) 500 MG capsule Take 1 capsule (500 mg total) by mouth 2 (two) times daily. 20 capsule 0  . clonazepam (KLONOPIN) 0.25 MG disintegrating tablet Take 1 tablet (0.25 mg total) by mouth 2 (two) times daily as needed (Anxiety). 60 tablet 5  . clopidogrel (PLAVIX) 75 MG tablet Take 75 mg by mouth at bedtime.    . furosemide (LASIX) 20 MG tablet Take 1 tablet (20 mg total) by mouth daily. 30 tablet 0  . gabapentin (NEURONTIN) 600 MG tablet Take 600 mg by mouth 3 (three) times daily.    . insulin aspart (NOVOLOG) 100 UNIT/ML injection Inject 4 Units into the skin 3 (three) times daily with meals. (Patient taking differently: Inject 10 Units into the skin 3 (three) times daily with meals. ) 10 mL 11  . insulin glargine (LANTUS) 100 UNIT/ML injection Inject 0.35 mLs (35 Units total) into the skin daily. (Patient taking differently: Inject 35 Units into the skin at bedtime. ) 10 mL 11  . ipratropium-albuterol (DUONEB) 0.5-2.5 (3) MG/3ML SOLN Take 3 mLs by nebulization every 4 (four) hours as needed. 360 mL 0  . latanoprost (XALATAN) 0.005 % ophthalmic solution Place 1 drop into both eyes at bedtime. 2.5 mL 0  .  metFORMIN (GLUCOPHAGE-XR) 500 MG 24 hr tablet Take 1 tablet by mouth 2 (two) times daily.  2  . metoprolol tartrate (LOPRESSOR) 25 MG tablet Take 1 tablet (25 mg total) by mouth 2 (two) times daily. 60 tablet 0  . nortriptyline (PAMELOR) 50 MG capsule Take 1 capsule (50 mg total) by mouth at bedtime. 90 capsule 0  . oxyCODONE (OXY IR/ROXICODONE) 5 MG immediate release tablet Take 1-2 tablets (5-10 mg total) by mouth every 4 (four) hours as needed for moderate pain. 40 tablet 0  . oxyCODONE-acetaminophen (PERCOCET) 10-325 MG tablet Take 1 tablet by mouth 3 (three) times daily. 30 tablet 0  . pantoprazole (PROTONIX) 20 MG tablet Take 20 mg by mouth daily before breakfast.    . acetaminophen (TYLENOL) 325 MG tablet Take 2 tablets (650 mg total) by mouth every 6 (six) hours as needed for mild pain (or Fever >/= 101). (Patient not taking: Reported on 02/18/2019)    . acetaminophen (TYLENOL) 650 MG suppository Place 1 suppository (650 mg total) rectally every 6 (six) hours as needed for mild pain (or Fever >/= 101). (Patient not taking: Reported on 02/18/2019) 12 suppository 0  . aspirin EC 81 MG tablet Take 1 tablet (81 mg total) by mouth daily. (Patient not taking: Reported on 02/18/2019) 150 tablet 2  . bisacodyl (DULCOLAX) 5 MG EC tablet Take 1 tablet (  5 mg total) by mouth daily as needed for moderate constipation. (Patient not taking: Reported on 02/18/2019) 30 tablet 0  . cephALEXin (KEFLEX) 500 MG capsule Take 1 capsule (500 mg total) by mouth 3 (three) times daily. (Patient not taking: Reported on 03/08/2019) 30 capsule 0  . loperamide (IMODIUM) 2 MG capsule Take 1 capsule (2 mg total) by mouth every 6 (six) hours as needed for diarrhea or loose stools. (Patient not taking: Reported on 02/18/2019) 30 capsule 0  . multivitamin-lutein (OCUVITE-LUTEIN) CAPS capsule Take 1 capsule by mouth daily. (Patient not taking: Reported on 03/08/2019) 30 capsule 11  . nicotine (NICODERM CQ - DOSED IN MG/24 HOURS) 14  mg/24hr patch Place 1 patch (14 mg total) onto the skin daily. (Patient not taking: Reported on 02/18/2019) 28 patch 0  . nicotine polacrilex (NICORETTE) 4 MG gum Take 1 each (4 mg total) by mouth as needed for smoking cessation. (Patient not taking: Reported on 03/08/2019) 100 tablet 0  . ondansetron (ZOFRAN) 4 MG/2ML SOLN injection Inject 2 mLs (4 mg total) into the vein every 6 (six) hours as needed for nausea or vomiting. (Patient not taking: Reported on 02/18/2019) 2 mL 0  . senna (SENOKOT) 8.6 MG TABS tablet Take 1 tablet (8.6 mg total) by mouth daily as needed for mild constipation. (Patient not taking: Reported on 02/18/2019) 120 each 0  . sodium chloride 1 g tablet Take 2 tablets (2 g total) by mouth 2 (two) times daily with a meal. (Patient not taking: Reported on 02/18/2019) 60 tablet 0   No current facility-administered medications for this visit.     Past Medical History:  Diagnosis Date  . Acute pain of right shoulder 05/19/2016  . Acute PN (pyelonephritis) 05/18/2016  . Acute pyelonephritis 05/18/2016  . Anxiety   . Arthritis   . Asthma   . Diabetes mellitus without complication ([place])   . GERD (gastroesophageal reflux disease)   . Glaucoma   . Hyperlipemia   . Hyperlipemia   . Hypertension   . Peripheral vascular disease [facility])     Past Surgical History:  Procedure Laterality Date  . AMPUTATION Left 01/07/2019   Procedure: AMPUTATION BELOW KNEE;  Surgeon: Algernon Huxley, MD;  Location: [facility] ORS;  Service: Vascular;  Laterality: Left;  . ANTERIOR CRUCIATE LIGAMENT REPAIR    . APPLICATION OF WOUND VAC Left 01/14/2019   Procedure: APPLICATION OF WOUND VAC;  Surgeon: Algernon Huxley, MD;  Location: [facility] ORS;  Service: Vascular;  Laterality: Left;  Wound vac [id number]   . APPLICATION OF WOUND VAC Left 02/21/2019   Procedure: APPLICATION OF WOUND VAC;  Surgeon: Algernon Huxley, MD;  Location: [facility];  Service: Vascular;  Laterality: Left;  . FASCIOTOMY Left 01/01/2019   Procedure:  FASCIOTOMY LEFT LEG;  Surgeon: Algernon Huxley, MD;  Location: [facility];  Service: Vascular;  Laterality: Left;  . I&D EXTREMITY Left 01/14/2019   Procedure: IRRIGATION AND DEBRIDEMENT EXTREMITY-LEFT BKA;  Surgeon: Algernon Huxley, MD;  Location: [facility] OR[facility];  Service: Vascular;  Laterality: Left;  . LOWER EXTREMITY ANGIOGRAPHY Left 12/17/2018   Procedure: LOWER EXTREMITY ANGIOGRAPHY;  Surgeon: Algernon Huxley, MD;  Location: [place] CV LAB;  Service: Cardiovascular;  Laterality: Left;  . LOWER EXTREMITY ANGIOGRAPHY Left 12/31/2018   Procedure: Lower Extremity Angiography;  Surgeon: Algernon Huxley, MD;  Location: [place] CV LAB;  Service: Cardiovascular;  Laterality: Left;  . LOWER EXTREMITY ANGIOGRAPHY Left 01/01/2019   Procedure: Lower Extremity Angiography;  Surgeon:  Algernon Huxley, MD;  Location: [place] CV LAB;  Service: Cardiovascular;  Laterality: Left;  . WOUND DEBRIDEMENT Left 02/21/2019   Procedure: DEBRIDEMENT WOUND;  Surgeon: Algernon Huxley, MD;  Location: [facility] ORS;  Service: Vascular;  Laterality: Left;    Social History Social History   Tobacco Use  . Smoking status: Former Smoker    Packs/day: 0.50    Start date: 01/18/2019  . Smokeless tobacco: Never Used  Substance Use Topics  . Alcohol use: No  . Drug use: Never    Family History Family History  Problem Relation Age of Onset  . Diabetes Mother   . Heart disease Mother   . Stroke Mother   . Kidney cancer Neg Hx   . Prostate cancer Neg Hx      Allergies  Allergen Reactions  . Biaxin [Clarithromycin] Rash    Patient states this medication gives her severe rash and thrush in the mouth.     REVIEW OF SYSTEMS (Negative unless checked)  Constitutional: [] Weight loss  [] Fever  [] Chills Cardiac: [] Chest pain   [] Chest pressure   [] Palpitations   [] Shortness of breath when laying flat   [] Shortness of breath at rest   [] Shortness of breath with exertion. Vascular:  [] Pain in legs with walking   [] Pain in legs at rest    [] Pain in legs when laying flat   [] Claudication   [] Pain in feet when walking  [] Pain in feet at rest  [] Pain in feet when laying flat   [] History of DVT   [] Phlebitis   [] Swelling in legs   [] Varicose veins   [] Non-healing ulcers Pulmonary:   [] Uses home oxygen   [] Productive cough   [] Hemoptysis   [] Wheeze  [] COPD   [] Asthma Neurologic:  [] Dizziness  [] Blackouts   [] Seizures   [] History of stroke   [] History of TIA  [] Aphasia   [] Temporary blindness   [] Dysphagia   [] Weakness or numbness in arms   [x] Weakness or numbness in legs Musculoskeletal:  [x] Arthritis   [] Joint swelling   [x] Joint pain   [] Low back pain Hematologic:  [] Easy bruising  [] Easy bleeding   [] Hypercoagulable state   [] Anemic   Gastrointestinal:  [] Blood in stool   [] Vomiting blood  [] Gastroesophageal reflux/heartburn   [] Abdominal pain Genitourinary:  [] Chronic kidney disease   [] Difficult urination  [] Frequent urination  [] Burning with urination   [] Hematuria Skin:  [] Rashes   [] Ulcers   [] Wounds Psychological:  [] History of anxiety   []  History of major depression.  Physical Examination  BP 94/64 (BP Location: Left Arm)   Pulse 64   Resp 17  Gen:  WD/WN, NAD Head: [place]/AT, No temporalis wasting. Ear/Nose/Throat: Hearing grossly intact, nares w/o erythema or drainage Eyes: Conjunctiva clear. Sclera non-icteric Neck: Supple.  Trachea midline Pulmonary:  Good air movement, no use of accessory muscles.  Cardiac: RRR, no JVD Vascular:  Vessel Right Left  Radial Palpable Palpable                       Musculoskeletal: M/S 5/5 throughout.  Using a wheelchair.  Left BKA site has finally epithelialized with no open wounds today.  No erythema or sign of infection. Neurologic: Sensation grossly intact in extremities.  Symmetrical.  Speech is fluent.  Psychiatric: Judgment intact, Mood & affect appropriate for pt's clinical situation. Dermatologic: No rashes or ulcers noted.  No cellulitis or open wounds.        Labs No results found for this or  any previous visit (from the past 2160 hour(s)).  Radiology No results found.  Assessment/Plan Diabetic peripheral neuropathy (HCC) (Location of Primary Source of Pain) (Bilateral) (R>L) blood glucose control important in reducing the progression of atherosclerotic disease. Also, involved in wound healing. On appropriate medications.   Ischemic leg Now s/p amputation  PAD (peripheral artery disease) (HCC) Check right ABI in 2-3 months.  Hx of BKA, left ([place]) Wound is finally healed and now she can begin her prosthesis placement.  Already seeing [facility].    Leotis Pain, MD  05/24/2019 9:54 AM    This note was created with Dragon medical transcription system.  Any errors from dictation are purely unintentional

## 2019-05-24 NOTE — Patient Instructions (Signed)
Stump and Prosthesis Care  When an arm or leg is removed, it is important to care for the artificial body part that replaces it (prosthesis) and for the remaining end of the arm or leg (stump). Caring for the stump and prosthesis will help you stay comfortable, active, and healthy.  How to care for your stump  Cleaning your skin  · Wash your stump with a mild antibacterial soap at least once each day.  · Wash your stump after getting dirty or sweaty.  · After washing your stump, pat it dry and let it air-dry for 5-10 minutes.  · Do not soak your stump in a warm or hot bath for longer than 20 minutes at a time.  · Avoid shaving hair on the stump. Hair that grows out after being shaved is more easily irritated by the prosthesis.  Using skin care products  · Apply ointment to your surgical scar if your health care provider told you to do so. This can keep the scar soft and help it heal.  · Do not put creams and lotions on your stump unless directed by your health care provider. If your health care provider says it is okay to put creams and lotions on your stump, do not use lotions that contain petroleum jelly.  · Do not use skin care products with an alcohol base. These products can be harmful to your skin. They can also damage the lining of the prosthesis.  · Consider using an antiperspirant spray on the skin of the stump if you are prone to sweating.  Other instructions  · Every day, look closely at the skin on your stump. Use a mirror with a long handle to check areas you cannot see, or ask a friend or family member to check those areas. Look for areas that appear reddish, swollen, or irritated. Pay extra attention to places where the stump and prosthesis rub together. Tell your health care provider if you have concerns.  · Wear the elastic wrap on your stump as told by your health care provider. The wrap may become loose or your stump may swell when you are active. Rewrap the bandage every couple of hours as  needed.  How to care for your prosthesis  Cleaning your prosthesis  · Use hot water and antibacterial soap to wash your prosthesis.  · Dry the prosthesis using a clean towel.  Attaching your prosthesis  · Make sure your prosthesis is clean before you attach it to your stump. Clean and dry all the parts that touch your skin.  · Wear socks or wraps under the prosthesis.  · Attach your prosthesis to your stump. Be sure you understand how to attach it. A prosthetic specialist (prosthetist) can show you how to do this. It is a good idea to practice several times while he or she watches.  Other instructions         · Exercise and move your prosthesis as told by your physical therapist.  · Follow your health care provider's instructions about the length of time you should wear your prosthesis. You may be instructed to:  ? Limit the amount of time you wear your prosthesis at first.  ? Increase the time you wear your prosthesis a little bit each day.  Where to find more information  · The [employer]: https://www.amputee-coalition.org/  Contact a health care provider if:  · The prosthesis does not seem to fit correctly.  · You have an itchy rash or   a sore on your stump.  · Sweating between the stump and the prosthesis is heavy, and efforts to control the sweating do not work.  Get help right away if:  · Your stump is painful to the touch, or it is red, swollen, or hot.  · A bad smell develops around the stump.  · There is a sore on your stump that is not healing.  · Your stump is colder than the upper part of your limb.  · Skin on your stump turns gray or black.  · There is drainage coming from your stump.  Summary  · When an arm or leg is removed, it is important to care for the artificial body part that replaces it (prosthesis) and for the remaining end of the arm or leg (stump).  · Wash your stump with a mild antibacterial soap at least once each day or if it gets dirty or sweaty. Then, pat it dry and let it air-dry  for 5-10 minutes.  · Follow your health care provider's instructions on the use of skin care products on your stump.  · Every day, closely check the skin on your stump. Look for areas that appear reddish, swollen, or irritated. Tell your health care provider if you have concerns.  · Make sure your prosthesis is clean before you attach it to your stump. Clean and dry all the parts that touch your skin.  This information is not intended to replace advice given to you by your health care provider. Make sure you discuss any questions you have with your health care provider.  Document Released: 03/08/2010 Document Revised: 01/31/2018 Document Reviewed: 01/31/2018  Elsevier Interactive Patient Education © 2019 [employer]

## 2019-06-18 ENCOUNTER — Telehealth (INDEPENDENT_AMBULATORY_CARE_PROVIDER_SITE_OTHER): Payer: Self-pay

## 2019-06-18 NOTE — Telephone Encounter (Signed)
Patient called needing a prescription for [employer] for AFO. This prescription was faxed to [facility]. I was not given a fax number and used the fax closest to the patient.

## 2019-06-24 ENCOUNTER — Telehealth (INDEPENDENT_AMBULATORY_CARE_PROVIDER_SITE_OTHER): Payer: Self-pay

## 2019-06-24 ENCOUNTER — Encounter (INDEPENDENT_AMBULATORY_CARE_PROVIDER_SITE_OTHER): Payer: Self-pay

## 2019-06-24 ENCOUNTER — Telehealth (INDEPENDENT_AMBULATORY_CARE_PROVIDER_SITE_OTHER): Payer: Self-pay | Admitting: Vascular Surgery

## 2019-06-24 NOTE — Telephone Encounter (Signed)
Ideally we would stop three days prior to her procedure.  If doing it before there will likely be some bleeding.  Patient can restart in 2-3 days once bleeding is under control

## 2019-06-24 NOTE — Telephone Encounter (Signed)
She don't need to be seen by [place], Just wanted to let us know the dentist would be calling us about her appointment tomorrow

## 2019-06-24 NOTE — Telephone Encounter (Signed)
What does the patient need to be seen by our office for?

## 2019-06-24 NOTE — Telephone Encounter (Signed)
Note will be faxed to Dr Gloris [place] office

## 2019-07-08 ENCOUNTER — Telehealth (INDEPENDENT_AMBULATORY_CARE_PROVIDER_SITE_OTHER): Payer: Self-pay

## 2019-07-08 NOTE — Telephone Encounter (Signed)
Home health nurse Merry Proud) left a voicemail stating with left bka surgical incision there is a small open area with drainage and the nurse is requesting antibiotics.I spoke with Eulogio Ditch NP and she advise that I call in Keflex 500mg  BID for 7 days but she wanted to inform the nurse that if the patient develops pain,swelling,redness,or increase drainage she will need to seen in the office.I left message on the nurse voicemail and the patient has been made aware with information.

## 2019-07-17 ENCOUNTER — Encounter: Payer: Self-pay | Admitting: Orthopaedic Surgery

## 2019-08-27 ENCOUNTER — Ambulatory Visit (INDEPENDENT_AMBULATORY_CARE_PROVIDER_SITE_OTHER): Payer: Medicare HMO | Admitting: Vascular Surgery

## 2019-08-27 ENCOUNTER — Encounter (INDEPENDENT_AMBULATORY_CARE_PROVIDER_SITE_OTHER): Payer: Self-pay | Admitting: Vascular Surgery

## 2019-08-27 ENCOUNTER — Ambulatory Visit (INDEPENDENT_AMBULATORY_CARE_PROVIDER_SITE_OTHER): Payer: Medicare HMO

## 2019-08-27 ENCOUNTER — Other Ambulatory Visit: Payer: Self-pay

## 2019-08-27 ENCOUNTER — Encounter (INDEPENDENT_AMBULATORY_CARE_PROVIDER_SITE_OTHER): Payer: Self-pay

## 2019-08-27 VITALS — BP 101/61 | HR 87 | Resp 16

## 2019-08-27 DIAGNOSIS — Z794 Long term (current) use of insulin: Secondary | ICD-10-CM

## 2019-08-27 DIAGNOSIS — I739 Peripheral vascular disease, unspecified: Secondary | ICD-10-CM

## 2019-08-27 DIAGNOSIS — Z89512 Acquired absence of left leg below knee: Secondary | ICD-10-CM | POA: Diagnosis not present

## 2019-08-27 DIAGNOSIS — E1122 Type 2 diabetes mellitus with diabetic chronic kidney disease: Secondary | ICD-10-CM | POA: Diagnosis not present

## 2019-08-27 DIAGNOSIS — N183 Chronic kidney disease, stage 3 (moderate): Secondary | ICD-10-CM | POA: Diagnosis not present

## 2019-08-27 NOTE — Assessment & Plan Note (Signed)
blood glucose control important in reducing the progression of atherosclerotic disease. Also, involved in wound healing. On appropriate medications.  

## 2019-08-27 NOTE — Assessment & Plan Note (Signed)
Her ABI today was 0.65 on the right consistent with moderate arterial occlusive disease.  Left BKA. No symptoms of concern at this time.  Recheck in six months

## 2019-08-27 NOTE — Assessment & Plan Note (Signed)
Small wound on the medial aspect of the left BKa.  Continue local wound care and recheck in two weeks.

## 2019-08-27 NOTE — Progress Notes (Signed)
MRN : [id number]  [name] is a 58 y.o. (1961/10/04) female who presents with chief complaint of  Chief Complaint  Patient presents with  . Follow-up    ultrasound follow up  .  History of Present Illness: Patient returns today in follow up of her left BKA and her right PAD.  Her left BKA was nearly healed until an area on the medial aspect of the wound began having some drainage and left a raw patch.  This is roughly the size of a quarter.  She has been doing local wound care and it has slowly improved.  No fevers or chills.  Her ABI today was 0.65 on the right consistent with moderate arterial occlusive disease.  She does not have any ulceration or rest pain on the right leg.  Current Outpatient Medications  Medication Sig Dispense Refill  . albuterol (PROAIR HFA) 108 (90 Base) MCG/ACT inhaler Inhale 2 puffs into the lungs every 6 (six) hours as needed (wheezing/shortness of breath).     Marland Kitchen. atorvastatin (LIPITOR) 20 MG tablet Take 20 mg by mouth at bedtime.  12  . clonazepam (KLONOPIN) 0.25 MG disintegrating tablet Take 1 tablet (0.25 mg total) by mouth 2 (two) times daily as needed (Anxiety). 60 tablet 5  . clopidogrel (PLAVIX) 75 MG tablet Take 75 mg by mouth at bedtime.    . furosemide (LASIX) 20 MG tablet Take 1 tablet (20 mg total) by mouth daily. 30 tablet 0  . gabapentin (NEURONTIN) 600 MG tablet Take 600 mg by mouth 3 (three) times daily.    . insulin aspart (NOVOLOG) 100 UNIT/ML injection Inject 4 Units into the skin 3 (three) times daily with meals. (Patient taking differently: Inject 15 Units into the skin 3 (three) times daily with meals. ) 10 mL 11  . insulin glargine (LANTUS) 100 UNIT/ML injection Inject 0.35 mLs (35 Units total) into the skin daily. (Patient taking differently: Inject 35 Units into the skin at bedtime. ) 10 mL 11  . ipratropium-albuterol (DUONEB) 0.5-2.5 (3) MG/3ML SOLN Take 3 mLs by nebulization every 4 (four) hours as needed. 360 mL 0  . latanoprost  (XALATAN) 0.005 % ophthalmic solution Place 1 drop into both eyes at bedtime. 2.5 mL 0  . metFORMIN (GLUCOPHAGE-XR) 500 MG 24 hr tablet Take 1 tablet by mouth 2 (two) times daily.  2  . metoprolol tartrate (LOPRESSOR) 25 MG tablet Take 1 tablet (25 mg total) by mouth 2 (two) times daily. 60 tablet 0  . nortriptyline (PAMELOR) 50 MG capsule Take 1 capsule (50 mg total) by mouth at bedtime. 90 capsule 0  . oxyCODONE-acetaminophen (PERCOCET) 10-325 MG tablet Take 1 tablet by mouth 3 (three) times daily. 30 tablet 0  . pantoprazole (PROTONIX) 20 MG tablet Take 20 mg by mouth daily before breakfast.    . acetaminophen (TYLENOL) 325 MG tablet Take 2 tablets (650 mg total) by mouth every 6 (six) hours as needed for mild pain (or Fever >/= 101). (Patient not taking: Reported on 02/18/2019)    . acetaminophen (TYLENOL) 650 MG suppository Place 1 suppository (650 mg total) rectally every 6 (six) hours as needed for mild pain (or Fever >/= 101). (Patient not taking: Reported on 02/18/2019) 12 suppository 0  . aspirin EC 81 MG tablet Take 1 tablet (81 mg total) by mouth daily. (Patient not taking: Reported on 02/18/2019) 150 tablet 2  . bisacodyl (DULCOLAX) 5 MG EC tablet Take 1 tablet (5 mg total) by mouth daily as  needed for moderate constipation. (Patient not taking: Reported on 02/18/2019) 30 tablet 0  . cephALEXin (KEFLEX) 500 MG capsule Take 1 capsule (500 mg total) by mouth 3 (three) times daily. (Patient not taking: Reported on 03/08/2019) 30 capsule 0  . cephALEXin (KEFLEX) 500 MG capsule Take 1 capsule (500 mg total) by mouth 2 (two) times daily. (Patient not taking: Reported on 08/27/2019) 20 capsule 0  . loperamide (IMODIUM) 2 MG capsule Take 1 capsule (2 mg total) by mouth every 6 (six) hours as needed for diarrhea or loose stools. (Patient not taking: Reported on 02/18/2019) 30 capsule 0  . multivitamin-lutein (OCUVITE-LUTEIN) CAPS capsule Take 1 capsule by mouth daily. (Patient not taking: Reported on  03/08/2019) 30 capsule 11  . nicotine (NICODERM CQ - DOSED IN MG/24 HOURS) 14 mg/24hr patch Place 1 patch (14 mg total) onto the skin daily. (Patient not taking: Reported on 02/18/2019) 28 patch 0  . nicotine polacrilex (NICORETTE) 4 MG gum Take 1 each (4 mg total) by mouth as needed for smoking cessation. (Patient not taking: Reported on 03/08/2019) 100 tablet 0  . ondansetron (ZOFRAN) 4 MG/2ML SOLN injection Inject 2 mLs (4 mg total) into the vein every 6 (six) hours as needed for nausea or vomiting. (Patient not taking: Reported on 02/18/2019) 2 mL 0  . oxyCODONE (OXY IR/ROXICODONE) 5 MG immediate release tablet Take 1-2 tablets (5-10 mg total) by mouth every 4 (four) hours as needed for moderate pain. (Patient not taking: Reported on 08/27/2019) 40 tablet 0  . senna (SENOKOT) 8.6 MG TABS tablet Take 1 tablet (8.6 mg total) by mouth daily as needed for mild constipation. (Patient not taking: Reported on 02/18/2019) 120 each 0  . sodium chloride 1 g tablet Take 2 tablets (2 g total) by mouth 2 (two) times daily with a meal. (Patient not taking: Reported on 02/18/2019) 60 tablet 0   No current facility-administered medications for this visit.     Past Medical History:  Diagnosis Date  . Acute pain of right shoulder 05/19/2016  . Acute PN (pyelonephritis) 05/18/2016  . Acute pyelonephritis 05/18/2016  . Anxiety   . Arthritis   . Asthma   . Diabetes mellitus without complication (HCC)   . GERD (gastroesophageal reflux disease)   . Glaucoma   . Hyperlipemia   . Hyperlipemia   . Hypertension   . Peripheral vascular disease [facility])     Past Surgical History:  Procedure Laterality Date  . AMPUTATION Left 01/07/2019   Procedure: AMPUTATION BELOW KNEE;  Surgeon: Annice Needy, MD;  Location: [facility] ORS;  Service: Vascular;  Laterality: Left;  . ANTERIOR CRUCIATE LIGAMENT REPAIR    . APPLICATION OF WOUND VAC Left 01/14/2019   Procedure: APPLICATION OF WOUND VAC;  Surgeon: Annice Needy, MD;  Location: [facility]  ORS;  Service: Vascular;  Laterality: Left;  Wound vac [id number]   . APPLICATION OF WOUND VAC Left 02/21/2019   Procedure: APPLICATION OF WOUND VAC;  Surgeon: Annice Needy, MD;  Location: [facility] ORS;  Service: Vascular;  Laterality: Left;  . FASCIOTOMY Left 01/01/2019   Procedure: FASCIOTOMY LEFT LEG;  Surgeon: Annice Needy, MD;  Location: [facility] ORS;  Service: Vascular;  Laterality: Left;  . I&D EXTREMITY Left 01/14/2019   Procedure: IRRIGATION AND DEBRIDEMENT EXTREMITY-LEFT BKA;  Surgeon: Annice Needy, MD;  Location: [facility] ORS;  Service: Vascular;  Laterality: Left;  . LOWER EXTREMITY ANGIOGRAPHY Left 12/17/2018   Procedure: LOWER EXTREMITY ANGIOGRAPHY;  Surgeon: Annice Needy, MD;  Location: [place] CV LAB;  Service: Cardiovascular;  Laterality: Left;  . LOWER EXTREMITY ANGIOGRAPHY Left 12/31/2018   Procedure: Lower Extremity Angiography;  Surgeon: Algernon Huxley, MD;  Location: [place] CV LAB;  Service: Cardiovascular;  Laterality: Left;  . LOWER EXTREMITY ANGIOGRAPHY Left 01/01/2019   Procedure: Lower Extremity Angiography;  Surgeon: Algernon Huxley, MD;  Location: [place] CV LAB;  Service: Cardiovascular;  Laterality: Left;  . WOUND DEBRIDEMENT Left 02/21/2019   Procedure: DEBRIDEMENT WOUND;  Surgeon: Algernon Huxley, MD;  Location: [facility] ORS;  Service: Vascular;  Laterality: Left;    Social History Social History   Tobacco Use  . Smoking status: Former Smoker    Packs/day: 0.50    Start date: 01/18/2019  . Smokeless tobacco: Never Used  Substance Use Topics  . Alcohol use: No  . Drug use: Never     Family History Family History  Problem Relation Age of Onset  . Diabetes Mother   . Heart disease Mother   . Stroke Mother   . Kidney cancer Neg Hx   . Prostate cancer Neg Hx     Allergies  Allergen Reactions  . Biaxin [Clarithromycin] Rash    Patient states this medication gives her severe rash and thrush in the mouth.     REVIEW OF SYSTEMS (Negative unless checked)   Constitutional: [] Weight loss  [] Fever  [] Chills Cardiac: [] Chest pain   [] Chest pressure   [] Palpitations   [] Shortness of breath when laying flat   [] Shortness of breath at rest   [] Shortness of breath with exertion. Vascular:  [] Pain in legs with walking   [] Pain in legs at rest   [] Pain in legs when laying flat   [] Claudication   [] Pain in feet when walking  [] Pain in feet at rest  [] Pain in feet when laying flat   [] History of DVT   [] Phlebitis   [] Swelling in legs   [] Varicose veins   [x] Non-healing ulcers Pulmonary:   [] Uses home oxygen   [] Productive cough   [] Hemoptysis   [] Wheeze  [] COPD   [] Asthma Neurologic:  [] Dizziness  [] Blackouts   [] Seizures   [] History of stroke   [] History of TIA  [] Aphasia   [] Temporary blindness   [] Dysphagia   [] Weakness or numbness in arms   [] Weakness or numbness in legs Musculoskeletal:  [x] Arthritis   [] Joint swelling   [] Joint pain   [] Low back pain Hematologic:  [] Easy bruising  [] Easy bleeding   [] Hypercoagulable state   [] Anemic   Gastrointestinal:  [] Blood in stool   [] Vomiting blood  [] Gastroesophageal reflux/heartburn   [] Abdominal pain Genitourinary:  [] Chronic kidney disease   [] Difficult urination  [] Frequent urination  [] Burning with urination   [] Hematuria Skin:  [] Rashes   [x] Ulcers   [x] Wounds Psychological:  [] History of anxiety   []  History of major depression.  Physical Examination  BP 101/61 (BP Location: Left Arm)   Pulse 87   Resp 16  Gen:  WD/WN, NAD Head: [employer]/AT, No temporalis wasting. Ear/Nose/Throat: Hearing grossly intact, nares w/o erythema or drainage Eyes: Conjunctiva clear. Sclera non-icteric Neck: Supple.  Trachea midline Pulmonary:  Good air movement, no use of accessory muscles.  Cardiac: RRR, no JVD Vascular:  Vessel Right Left  Radial Palpable Palpable                          PT 1+ Palpable Not Palpable  DP 1+ Palpable Not Palpable    Musculoskeletal: M/S 5/5  throughout.  No deformity or atrophy. In a  wheelchair.  Trace right lower extremity edema. Neurologic: Sensation grossly intact in extremities.  Symmetrical.  Speech is fluent.  Psychiatric: Judgment intact, Mood & affect appropriate for pt's clinical situation. Dermatologic: Left BKA with about a quarter sized area on the medial aspect with raw superficial wound.  No erythema.  No drainage today.       Labs No results found for this or any previous visit (from the past 2160 hour(s)).  Radiology No results found.  Assessment/Plan  Diabetes mellitus (HCC) blood glucose control important in reducing the progression of atherosclerotic disease. Also, involved in wound healing. On appropriate medications.    Hx of BKA, left (HCC) Small wound on the medial aspect of the left BKa.  Continue local wound care and recheck in two weeks.  PAD (peripheral artery disease) (HCC) Her ABI today was 0.65 on the right consistent with moderate arterial occlusive disease.  Left BKA. No symptoms of concern at this time.  Recheck in six months    Festus BarrenJason Boden Stucky, MD  08/27/2019 3:07 PM    This note was created with Dragon medical transcription system.  Any errors from dictation are purely unintentional

## 2019-09-10 ENCOUNTER — Ambulatory Visit (INDEPENDENT_AMBULATORY_CARE_PROVIDER_SITE_OTHER): Payer: Medicare HMO | Admitting: Nurse Practitioner

## 2019-09-13 ENCOUNTER — Ambulatory Visit (INDEPENDENT_AMBULATORY_CARE_PROVIDER_SITE_OTHER): Payer: Medicare HMO | Admitting: Vascular Surgery

## 2019-09-13 ENCOUNTER — Encounter (INDEPENDENT_AMBULATORY_CARE_PROVIDER_SITE_OTHER): Payer: Self-pay | Admitting: Vascular Surgery

## 2019-09-13 ENCOUNTER — Telehealth (INDEPENDENT_AMBULATORY_CARE_PROVIDER_SITE_OTHER): Payer: Self-pay | Admitting: Vascular Surgery

## 2019-09-13 ENCOUNTER — Other Ambulatory Visit: Payer: Self-pay

## 2019-09-13 VITALS — BP 103/67 | HR 76 | Resp 12 | Ht 63.0 in | Wt 253.0 lb

## 2019-09-13 DIAGNOSIS — Z89512 Acquired absence of left leg below knee: Secondary | ICD-10-CM | POA: Diagnosis not present

## 2019-09-13 DIAGNOSIS — N183 Chronic kidney disease, stage 3 (moderate): Secondary | ICD-10-CM | POA: Diagnosis not present

## 2019-09-13 DIAGNOSIS — I739 Peripheral vascular disease, unspecified: Secondary | ICD-10-CM

## 2019-09-13 DIAGNOSIS — E1122 Type 2 diabetes mellitus with diabetic chronic kidney disease: Secondary | ICD-10-CM

## 2019-09-13 DIAGNOSIS — Z794 Long term (current) use of insulin: Secondary | ICD-10-CM

## 2019-09-13 NOTE — Assessment & Plan Note (Signed)
We are going to switch over to a Vaseline gauze or Xeroform on the wound to try to protect it as it epithelializes.  Return in 2 to 3 weeks to check the wound.

## 2019-09-13 NOTE — Progress Notes (Signed)
MRN : [id number]  [name] is a 58 y.o. (1961/10/13) female who presents with chief complaint of  Chief Complaint  Patient presents with   Follow-up  .  History of Present Illness: Patient returns today in follow up of left BKA wound.  Her wound has been under a Band-Aid for the last week or so, and there is an odor to it but the skin underneath there is actually starting to epithelialize reasonably well.  There is no sign of infection.  The skin around the wound is somewhat raw and may have been irritated by the longstanding bandage.  Current Outpatient Medications  Medication Sig Dispense Refill   albuterol (PROAIR HFA) 108 (90 Base) MCG/ACT inhaler Inhale 2 puffs into the lungs every 6 (six) hours as needed (wheezing/shortness of breath).      atorvastatin (LIPITOR) 20 MG tablet Take 20 mg by mouth at bedtime.  12   clonazepam (KLONOPIN) 0.25 MG disintegrating tablet Take 1 tablet (0.25 mg total) by mouth 2 (two) times daily as needed (Anxiety). 60 tablet 5   clopidogrel (PLAVIX) 75 MG tablet Take 75 mg by mouth at bedtime.     furosemide (LASIX) 20 MG tablet Take 1 tablet (20 mg total) by mouth daily. 30 tablet 0   gabapentin (NEURONTIN) 600 MG tablet Take 600 mg by mouth 3 (three) times daily.     insulin aspart (NOVOLOG) 100 UNIT/ML injection Inject 4 Units into the skin 3 (three) times daily with meals. (Patient taking differently: Inject 15 Units into the skin 3 (three) times daily with meals. ) 10 mL 11   insulin glargine (LANTUS) 100 UNIT/ML injection Inject 0.35 mLs (35 Units total) into the skin daily. (Patient taking differently: Inject 35 Units into the skin at bedtime. ) 10 mL 11   ipratropium-albuterol (DUONEB) 0.5-2.5 (3) MG/3ML SOLN Take 3 mLs by nebulization every 4 (four) hours as needed. 360 mL 0   latanoprost (XALATAN) 0.005 % ophthalmic solution Place 1 drop into both eyes at bedtime. 2.5 mL 0   metFORMIN (GLUCOPHAGE-XR) 500 MG 24 hr tablet Take 1  tablet by mouth 2 (two) times daily.  2   metoprolol tartrate (LOPRESSOR) 25 MG tablet Take 1 tablet (25 mg total) by mouth 2 (two) times daily. 60 tablet 0   nortriptyline (PAMELOR) 50 MG capsule Take 1 capsule (50 mg total) by mouth at bedtime. 90 capsule 0   oxyCODONE-acetaminophen (PERCOCET) 10-325 MG tablet Take 1 tablet by mouth 3 (three) times daily. 30 tablet 0   pantoprazole (PROTONIX) 20 MG tablet Take 20 mg by mouth daily before breakfast.     No current facility-administered medications for this visit.     Past Medical History:  Diagnosis Date   Acute pain of right shoulder 05/19/2016   Acute PN (pyelonephritis) 05/18/2016   Acute pyelonephritis 05/18/2016   Anxiety    Arthritis    Asthma    Diabetes mellitus without complication (HCC)    GERD (gastroesophageal reflux disease)    Glaucoma    Hyperlipemia    Hyperlipemia    Hypertension    Peripheral vascular disease (HCC)     Past Surgical History:  Procedure Laterality Date   AMPUTATION Left 01/07/2019   Procedure: AMPUTATION BELOW KNEE;  Surgeon: Annice Needy, MD;  Location: [facility];  Service: Vascular;  Laterality: Left;   ANTERIOR CRUCIATE LIGAMENT REPAIR     APPLICATION OF WOUND VAC Left 01/14/2019   Procedure: APPLICATION OF WOUND VAC;  Surgeon: Annice Needyew, Karaline Buresh S, MD;  Location: [facility];  Service: Vascular;  Laterality: Left;  Wound vac [id number]    APPLICATION OF WOUND VAC Left 02/21/2019   Procedure: APPLICATION OF WOUND VAC;  Surgeon: Annice Needyew, Darious Rehman S, MD;  Location: [facility];  Service: Vascular;  Laterality: Left;   FASCIOTOMY Left 01/01/2019   Procedure: FASCIOTOMY LEFT LEG;  Surgeon: Annice Needyew, Osa Campoli S, MD;  Location: [facility];  Service: Vascular;  Laterality: Left;   I&D EXTREMITY Left 01/14/2019   Procedure: IRRIGATION AND DEBRIDEMENT EXTREMITY-LEFT BKA;  Surgeon: Annice Needyew, Saylah Ketner S, MD;  Location: [facility];  Service: Vascular;  Laterality: Left;   LOWER EXTREMITY ANGIOGRAPHY Left 12/17/2018   Procedure:  LOWER EXTREMITY ANGIOGRAPHY;  Surgeon: Annice Needyew, Emmry Hinsch S, MD;  Location: [facility];  Service: Cardiovascular;  Laterality: Left;   LOWER EXTREMITY ANGIOGRAPHY Left 12/31/2018   Procedure: Lower Extremity Angiography;  Surgeon: Annice Needyew, Jerlisa Diliberto S, MD;  Location: [facility];  Service: Cardiovascular;  Laterality: Left;   LOWER EXTREMITY ANGIOGRAPHY Left 01/01/2019   Procedure: Lower Extremity Angiography;  Surgeon: Annice Needyew, Noland Pizano S, MD;  Location: [facility];  Service: Cardiovascular;  Laterality: Left;   WOUND DEBRIDEMENT Left 02/21/2019   Procedure: DEBRIDEMENT WOUND;  Surgeon: Annice Needyew, Nabeeha Badertscher S, MD;  Location: [facility];  Service: Vascular;  Laterality: Left;    Social History        Tobacco Use   Smoking status: Former Smoker    Packs/day: 0.50    Start date: 01/18/2019   Smokeless tobacco: Never Used  Substance Use Topics   Alcohol use: No   Drug use: Never     Family History      Family History  Problem Relation Age of Onset   Diabetes Mother    Heart disease Mother    Stroke Mother    Kidney cancer Neg Hx    Prostate cancer Neg Hx          Allergies  Allergen Reactions   Biaxin [Clarithromycin] Rash    Patient states this medication gives her severe rash and thrush in the mouth.     REVIEW OF SYSTEMS (Negative unless checked)  Constitutional: [] ?Weight loss  [] ?Fever  [] ?Chills Cardiac: [] ?Chest pain   [] ?Chest pressure   [] ?Palpitations   [] ?Shortness of breath when laying flat   [] ?Shortness of breath at rest   [] ?Shortness of breath with exertion. Vascular:  [] ?Pain in legs with walking   [] ?Pain in legs at rest   [] ?Pain in legs when laying flat   [] ?Claudication   [] ?Pain in feet when walking  [] ?Pain in feet at rest  [] ?Pain in feet when laying flat   [] ?History of DVT   [] ?Phlebitis   [] ?Swelling in legs   [] ?Varicose veins   [x] ?Non-healing ulcers Pulmonary:   [] ?Uses home oxygen   [] ?Productive cough   [] ?Hemoptysis    [] ?Wheeze  [] ?COPD   [] ?Asthma Neurologic:  [] ?Dizziness  [] ?Blackouts   [] ?Seizures   [] ?History of stroke   [] ?History of TIA  [] ?Aphasia   [] ?Temporary blindness   [] ?Dysphagia   [] ?Weakness or numbness in arms   [] ?Weakness or numbness in legs Musculoskeletal:  [x] ?Arthritis   [] ?Joint swelling   [] ?Joint pain   [] ?Low back pain Hematologic:  [] ?Easy bruising  [] ?Easy bleeding   [] ?Hypercoagulable state   [] ?Anemic   Gastrointestinal:  [] ?Blood in stool   [] ?Vomiting blood  [] ?Gastroesophageal reflux/heartburn   [] ?Abdominal pain Genitourinary:  [] ?Chronic kidney disease   [] ?Difficult urination  [] ?Frequent  urination  [] ?Burning with urination   [] ?Hematuria Skin:  [] ?Rashes   [x] ?Ulcers   [x] ?Wounds Psychological:  [] ?History of anxiety   [] ? History of major depression.    Physical Examination  BP 103/67 (BP Location: Left Arm, Patient Position: Sitting, Cuff Size: Normal)    Pulse 76    Resp 12    Ht 5\' 3"  (1.6 m)    Wt 253 lb (114.8 kg)    BMI 44.82 kg/m  Gen:  WD/WN, NAD Head: [place]/AT, No temporalis wasting. Ear/Nose/Throat: Hearing grossly intact, nares w/o erythema or drainage Eyes: Conjunctiva clear. Sclera non-icteric Neck: Supple.  Trachea midline Pulmonary:  Good air movement, no use of accessory muscles.  Cardiac: RRR, no JVD  Musculoskeletal: M/S 5/5 throughout.  No deformity or atrophy.  In a wheelchair.  Left BKA with the area that was an open wound a few weeks ago now having thin epithelialization.  Mild irritation of the skin underneath that area but no wound below the knee through the previous wound.  Minimal drainage Neurologic: Sensation grossly intact in extremities.  Symmetrical.  Speech is fluent.  Psychiatric: Judgment intact, Mood & affect appropriate for pt's clinical situation. Dermatologic: Left BKA site as above       Labs No results found for this or any previous visit (from the past 2160 hour(s)).  Radiology Vas [place] Abi With/wo Tbi  Result  Date: 08/27/2019 LOWER EXTREMITY DOPPLER STUDY Indications: Peripheral artery disease.  Performing Technologist: Charlane Ferretti RT (R)(VS)  Examination Guidelines: A complete evaluation includes at minimum, Doppler waveform signals and systolic blood pressure reading at the level of bilateral brachial, anterior tibial, and posterior tibial arteries, when vessel segments are accessible. Bilateral testing is considered an integral part of a complete examination. Photoelectric Plethysmograph (PPG) waveforms and toe systolic pressure readings are included as required and additional duplex testing as needed. Limited examinations for reoccurring indications may be performed as noted.  ABI Findings: +---------+------------------+-----+----------+--------+  Right     Rt Pressure (mmHg) Index Waveform   Comment   +---------+------------------+-----+----------+--------+  Brachial  90                                            +---------+------------------+-----+----------+--------+  ATA       53                 0.52  monophasic           +---------+------------------+-----+----------+--------+  PTA       66                 0.65  monophasic           +---------+------------------+-----+----------+--------+  Great Toe 60                 0.59  Abnormal             +---------+------------------+-----+----------+--------+ +--------+------------------+-----+--------+-------+  Left     Lt Pressure (mmHg) Index Waveform Comment  +--------+------------------+-----+--------+-------+  Brachial 101                               AKA      +--------+------------------+-----+--------+-------+ Summary: Right: Resting right ankle-brachial index indicates moderate right lower extremity arterial disease. The right toe-brachial index is abnormal.  *See table(s) above for measurements and observations.  Electronically signed by Corene Cornea  Josefine Fuhr MD on 08/27/2019 at 3:45:28 PM.   Final     Assessment/Plan Diabetes mellitus (HCC) blood glucose control  important in reducing the progression of atherosclerotic disease. Also, involved in wound healing. On appropriate medications.  PAD (peripheral artery disease) (HCC) Her ABI earlier this  month was 0.65 on the right consistent with moderate arterial occlusive disease.  Left BKA. No symptoms of concern at this time.  Recheck in six months  Hx of BKA, left (HCC) We are going to switch over to a Vaseline gauze or Xeroform on the wound to try to protect it as it epithelializes.  Return in 2 to 3 weeks to check the wound.    Festus Barren, MD  09/13/2019 10:56 AM    This note was created with Dragon medical transcription system.  Any errors from dictation are purely unintentional

## 2019-09-13 NOTE — Telephone Encounter (Signed)
Patient was seen in our office today and Dew wanted to increase the amount of time for home health for wound dressings. Requesting xeroform on stump wrapped in kurlex. I have contacted Merry Proud with [facility] [phone] and left a message with verbal order. AS, CMA

## 2019-09-25 ENCOUNTER — Other Ambulatory Visit (INDEPENDENT_AMBULATORY_CARE_PROVIDER_SITE_OTHER): Payer: Self-pay | Admitting: Nurse Practitioner

## 2019-09-25 ENCOUNTER — Telehealth (INDEPENDENT_AMBULATORY_CARE_PROVIDER_SITE_OTHER): Payer: Self-pay

## 2019-09-25 MED ORDER — CEPHALEXIN 500 MG PO CAPS: 500.0000 mg | ORAL_CAPSULE | Freq: Two times a day (BID) | ORAL | 0 refills | Status: AC

## 2019-09-25 NOTE — Telephone Encounter (Signed)
I left a message on [employer] with detailed information and I have informed the patient about the antibiotics being sent to pharmacy.

## 2019-09-25 NOTE — Telephone Encounter (Signed)
Dressing changes are fine as requested.  Cephalexin has been sent to patient's pharmacy

## 2019-10-01 ENCOUNTER — Ambulatory Visit (INDEPENDENT_AMBULATORY_CARE_PROVIDER_SITE_OTHER): Payer: Medicare HMO | Admitting: Vascular Surgery

## 2019-10-08 ENCOUNTER — Encounter (INDEPENDENT_AMBULATORY_CARE_PROVIDER_SITE_OTHER): Payer: Self-pay | Admitting: Vascular Surgery

## 2019-10-08 ENCOUNTER — Other Ambulatory Visit: Payer: Self-pay

## 2019-10-08 ENCOUNTER — Encounter (INDEPENDENT_AMBULATORY_CARE_PROVIDER_SITE_OTHER): Payer: Self-pay

## 2019-10-08 ENCOUNTER — Ambulatory Visit (INDEPENDENT_AMBULATORY_CARE_PROVIDER_SITE_OTHER): Payer: Medicare HMO | Admitting: Vascular Surgery

## 2019-10-08 VITALS — BP 114/74 | HR 96 | Resp 18 | Ht 63.0 in | Wt 243.0 lb

## 2019-10-08 DIAGNOSIS — N183 Chronic kidney disease, stage 3 unspecified: Secondary | ICD-10-CM | POA: Diagnosis not present

## 2019-10-08 DIAGNOSIS — Z89512 Acquired absence of left leg below knee: Secondary | ICD-10-CM

## 2019-10-08 DIAGNOSIS — E1122 Type 2 diabetes mellitus with diabetic chronic kidney disease: Secondary | ICD-10-CM | POA: Diagnosis not present

## 2019-10-08 DIAGNOSIS — I739 Peripheral vascular disease, unspecified: Secondary | ICD-10-CM

## 2019-10-08 DIAGNOSIS — Z794 Long term (current) use of insulin: Secondary | ICD-10-CM

## 2019-10-08 NOTE — Progress Notes (Signed)
MRN : [id number]  [name] is a 58 y.o. ([birth year]) female who presents with chief complaint of  Chief Complaint  Patient presents with  . Follow-up    2-3 week f/u  .  History of Present Illness: Patient returns today in follow up of her left BKA wound.  Her home health nurse expressed a stitch out of the area recently that was likely creating a stitch abscess.  The wound is healing now and has only a very small residual hole.  No redness.  No fevers or chills.  Current Outpatient Medications  Medication Sig Dispense Refill  . albuterol (PROAIR HFA) 108 (90 Base) MCG/ACT inhaler Inhale 2 puffs into the lungs every 6 (six) hours as needed (wheezing/shortness of breath).     Marland Kitchen atorvastatin (LIPITOR) 20 MG tablet Take 20 mg by mouth at bedtime.  12  . clonazepam (KLONOPIN) 0.25 MG disintegrating tablet Take 1 tablet (0.25 mg total) by mouth 2 (two) times daily as needed (Anxiety). 60 tablet 5  . furosemide (LASIX) 20 MG tablet Take 1 tablet (20 mg total) by mouth daily. 30 tablet 0  . gabapentin (NEURONTIN) 600 MG tablet Take 600 mg by mouth 3 (three) times daily.    . insulin aspart (NOVOLOG) 100 UNIT/ML injection Inject 4 Units into the skin 3 (three) times daily with meals. (Patient taking differently: Inject 15 Units into the skin 3 (three) times daily with meals. ) 10 mL 11  . insulin glargine (LANTUS) 100 UNIT/ML injection Inject 0.35 mLs (35 Units total) into the skin daily. (Patient taking differently: Inject 35 Units into the skin at bedtime. ) 10 mL 11  . ipratropium-albuterol (DUONEB) 0.5-2.5 (3) MG/3ML SOLN Take 3 mLs by nebulization every 4 (four) hours as needed. 360 mL 0  . latanoprost (XALATAN) 0.005 % ophthalmic solution Place 1 drop into both eyes at bedtime. 2.5 mL 0  . metFORMIN (GLUCOPHAGE-XR) 500 MG 24 hr tablet Take 1 tablet by mouth 2 (two) times daily.  2  . metoprolol tartrate (LOPRESSOR) 25 MG tablet Take 1 tablet (25 mg total) by mouth 2 (two) times daily. 60  tablet 0  . nortriptyline (PAMELOR) 50 MG capsule Take 1 capsule (50 mg total) by mouth at bedtime. 90 capsule 0  . oxyCODONE-acetaminophen (PERCOCET) 10-325 MG tablet Take 1 tablet by mouth 3 (three) times daily. 30 tablet 0  . pantoprazole (PROTONIX) 20 MG tablet Take 20 mg by mouth daily before breakfast.    . cephALEXin (KEFLEX) 500 MG capsule Take 1 capsule (500 mg total) by mouth 2 (two) times daily. (Patient not taking: Reported on 10/08/2019) 20 capsule 0  . clopidogrel (PLAVIX) 75 MG tablet Take 75 mg by mouth at bedtime.     No current facility-administered medications for this visit.     Past Medical History:  Diagnosis Date  . Acute pain of right shoulder 05/19/2016  . Acute PN (pyelonephritis) 05/18/2016  . Acute pyelonephritis 05/18/2016  . Anxiety   . Arthritis   . Asthma   . Diabetes mellitus without complication (HCC)   . GERD (gastroesophageal reflux disease)   . Glaucoma   . Hyperlipemia   . Hyperlipemia   . Hypertension   . Peripheral vascular disease [facility])     Past Surgical History:  Procedure Laterality Date  . AMPUTATION Left 01/07/2019   Procedure: AMPUTATION BELOW KNEE;  Surgeon: Annice Needy, MD;  Location: [facility];  Service: Vascular;  Laterality: Left;  . ANTERIOR CRUCIATE LIGAMENT  REPAIR    . APPLICATION OF WOUND VAC Left 01/14/2019   Procedure: APPLICATION OF WOUND VAC;  Surgeon: Annice Needy, MD;  Location: [facility];  Service: Vascular;  Laterality: Left;  Wound vac [id number]   . APPLICATION OF WOUND VAC Left 02/21/2019   Procedure: APPLICATION OF WOUND VAC;  Surgeon: Annice Needy, MD;  Location: [facility];  Service: Vascular;  Laterality: Left;  . FASCIOTOMY Left 01/01/2019   Procedure: FASCIOTOMY LEFT LEG;  Surgeon: Annice Needy, MD;  Location: [facility];  Service: Vascular;  Laterality: Left;  . I&D EXTREMITY Left 01/14/2019   Procedure: IRRIGATION AND DEBRIDEMENT EXTREMITY-LEFT BKA;  Surgeon: Annice Needy, MD;  Location: [facility];  Service: Vascular;   Laterality: Left;  . LOWER EXTREMITY ANGIOGRAPHY Left 12/17/2018   Procedure: LOWER EXTREMITY ANGIOGRAPHY;  Surgeon: Annice Needy, MD;  Location: [facility];  Service: Cardiovascular;  Laterality: Left;  . LOWER EXTREMITY ANGIOGRAPHY Left 12/31/2018   Procedure: Lower Extremity Angiography;  Surgeon: Annice Needy, MD;  Location: [facility];  Service: Cardiovascular;  Laterality: Left;  . LOWER EXTREMITY ANGIOGRAPHY Left 01/01/2019   Procedure: Lower Extremity Angiography;  Surgeon: Annice Needy, MD;  Location: [facility];  Service: Cardiovascular;  Laterality: Left;  . WOUND DEBRIDEMENT Left 02/21/2019   Procedure: DEBRIDEMENT WOUND;  Surgeon: Annice Needy, MD;  Location: [facility];  Service: Vascular;  Laterality: Left;    Social History        Tobacco Use  . Smoking status: Former Smoker    Packs/day: 0.50    Start date: 01/18/2019  . Smokeless tobacco: Never Used  Substance Use Topics  . Alcohol use: No  . Drug use: Never     Family History      Family History  Problem Relation Age of Onset  . Diabetes Mother   . Heart disease Mother   . Stroke Mother   . Kidney cancer Neg Hx   . Prostate cancer Neg Hx          Allergies  Allergen Reactions  . Biaxin [Clarithromycin] Rash    Patient states this medication gives her severe rash and thrush in the mouth.     REVIEW OF SYSTEMS(Negative unless checked)  Constitutional: [] ??Weight loss [] ??Fever [] ??Chills Cardiac: [] ??Chest pain [] ??Chest pressure [] ??Palpitations [] ??Shortness of breath when laying flat [] ??Shortness of breath at rest [] ??Shortness of breath with exertion. Vascular: [] ??Pain in legs with walking [] ??Pain in legs at rest [] ??Pain in legs when laying flat [] ??Claudication [] ??Pain in feet when walking [] ??Pain in feet at rest [] ??Pain in feet when laying flat [] ??History of DVT [] ??Phlebitis [] ??Swelling in legs  [] ??Varicose veins [x] ??Non-healing ulcers Pulmonary: [] ??Uses home oxygen [] ??Productive cough [] ??Hemoptysis [] ??Wheeze [] ??COPD [] ??Asthma Neurologic: [] ??Dizziness [] ??Blackouts [] ??Seizures [] ??History of stroke [] ??History of TIA [] ??Aphasia [] ??Temporary blindness [] ??Dysphagia [] ??Weakness or numbness in arms [] ??Weakness or numbness in legs Musculoskeletal: [x] ??Arthritis [] ??Joint swelling [] ??Joint pain [] ??Low back pain Hematologic: [] ??Easy bruising [] ??Easy bleeding [] ??Hypercoagulable state [] ??Anemic  Gastrointestinal: [] ??Blood in stool [] ??Vomiting blood [] ??Gastroesophageal reflux/heartburn [] ??Abdominal pain Genitourinary: [] ??Chronic kidney disease [] ??Difficult urination [] ??Frequent urination [] ??Burning with urination [] ??Hematuria Skin: [] ??Rashes [x] ??Ulcers [x] ??Wounds Psychological: [] ??History of anxiety [] ??History of major depression.    Physical Examination  BP 114/74 (BP Location: Left Arm)   Pulse 96   Resp 18   Ht 5\' 3"  (1.6 m)   Wt 243 lb (110.2 kg)   BMI 43.05 kg/m  Gen:  WD/WN, NAD Head: [place]/AT, No temporalis wasting. Ear/Nose/Throat:  Hearing grossly intact, nares w/o erythema or drainage Eyes: Conjunctiva clear. Sclera non-icteric Neck: Supple.  Trachea midline Pulmonary:  Good air movement, no use of accessory muscles.  Cardiac: RRR, no JVD Vascular:  Vessel Right Left  Radial Palpable Palpable                   Musculoskeletal: M/S 5/5 throughout.  No deformity or atrophy.  Left BKA stump with only a pinhole opening at this point.  No surrounding erythema or significant drainage.  Neurologic: Sensation grossly intact in extremities.  Symmetrical.  Speech is fluent.  Psychiatric: Judgment intact, Mood & affect appropriate for pt's clinical situation. Dermatologic: Left BKA wound significantly improved.       Labs No results found for this or any previous visit  (from the past 2160 hour(s)).  Radiology No results found.  Assessment/Plan Diabetes mellitus ([place]) blood glucose control important in reducing the progression of atherosclerotic disease. Also, involved in wound healing. On appropriate medications.  PAD (peripheral artery disease) (HCC) Her ABI earlier last  month was 0.65 on the right consistent with moderate arterial occlusive disease.Left BKA. No symptoms of concern at this time. Recheck in six months  Hx of BKA, left ([place]) Wound improving.  Likely had a significant stitch abscess that should get better now that the stitch has been expressed.  She has an appointment coming up in a few months and she will contact us with any problems in the interim.    Leotis Pain, MD  10/08/2019 10:29 AM    This note was created with Dragon medical transcription system.  Any errors from dictation are purely unintentional

## 2019-10-08 NOTE — Assessment & Plan Note (Signed)
Wound improving.  Likely had a significant stitch abscess that should get better now that the stitch has been expressed.  She has an appointment coming up in a few months and she will contact us with any problems in the interim.

## 2019-12-25 ENCOUNTER — Ambulatory Visit: Payer: Medicare HMO | Admitting: Internal Medicine

## 2020-01-15 ENCOUNTER — Ambulatory Visit: Payer: Medicare HMO | Admitting: Internal Medicine

## 2020-02-18 IMAGING — US US EXTREM LOW DUPLEX ARTERIAL BILAT
1 series · 13 of 25 positions shown · non-contrast
Comparison: None.

CLINICAL DATA: Nonhealing wound of the left heel. History of
hypertension, hyperlipidemia, diabetes and smoking.

EXAM:
BILATERAL LOWER EXTREMITY ARTERIAL DUPLEX SCAN
TECHNIQUE: Gray-scale sonography as well as color Doppler and duplex ultrasound
was performed to evaluate the arteries of both lower extremities
including the common, superficial and profunda femoral arteries,
popliteal artery and calf arteries.

[Series 1: us extrem low duplex arterial bilat · 0.09mm/px · 13 of 79 slices shown]
[im 1/79]
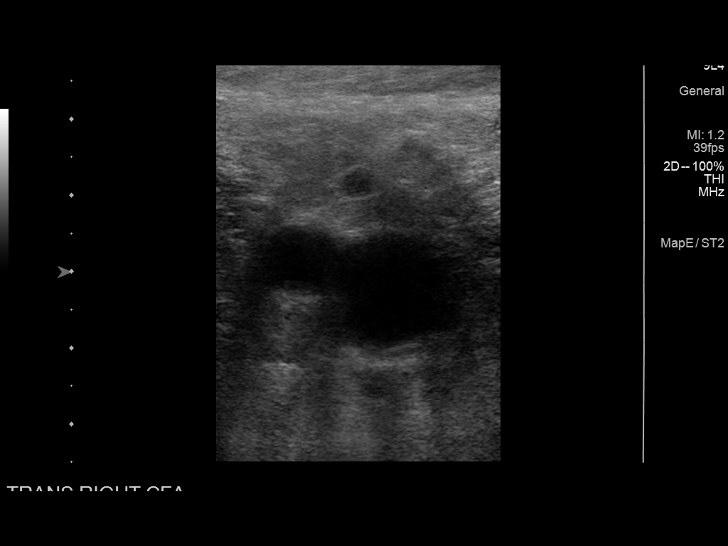
[im 7/79]
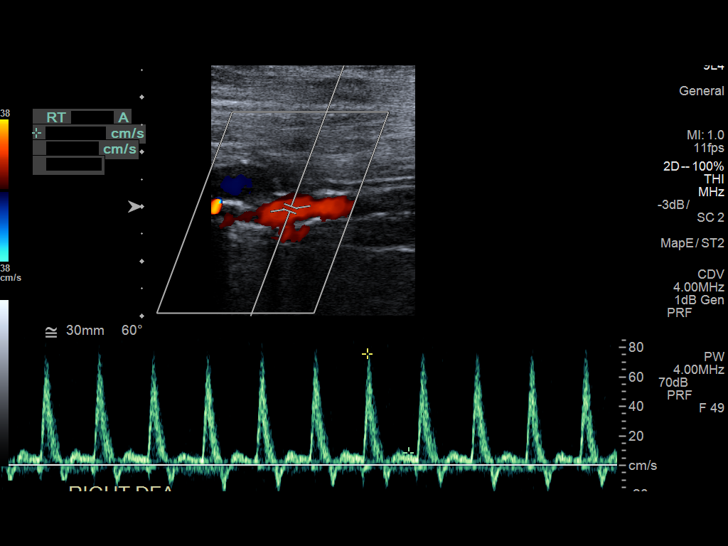
[im 14/79]
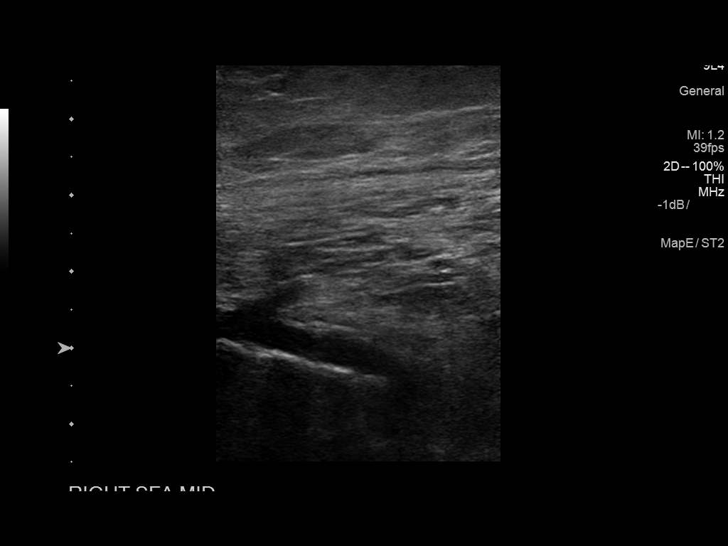
[im 20/79]
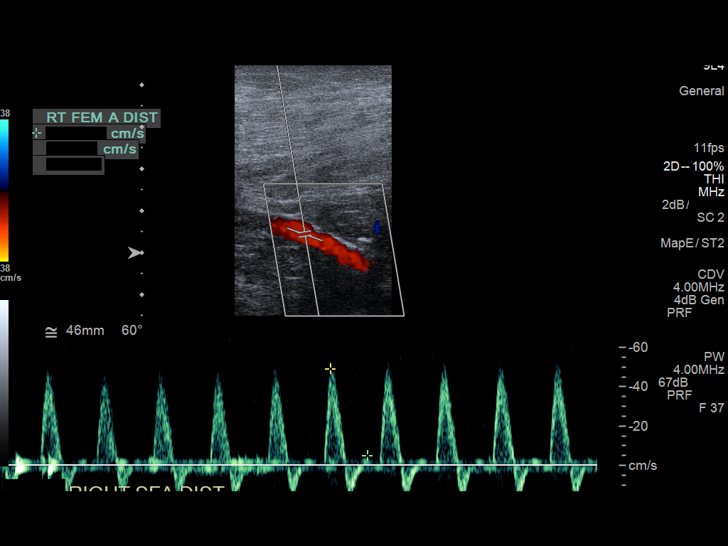
[im 27/79]
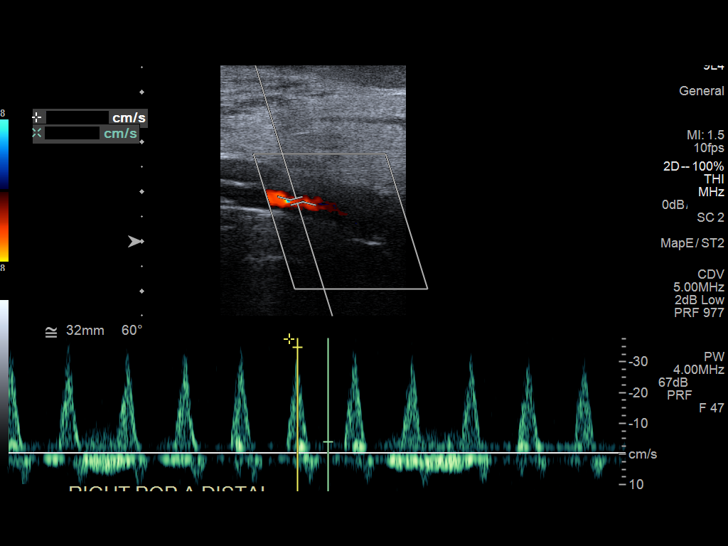
[im 33/79]
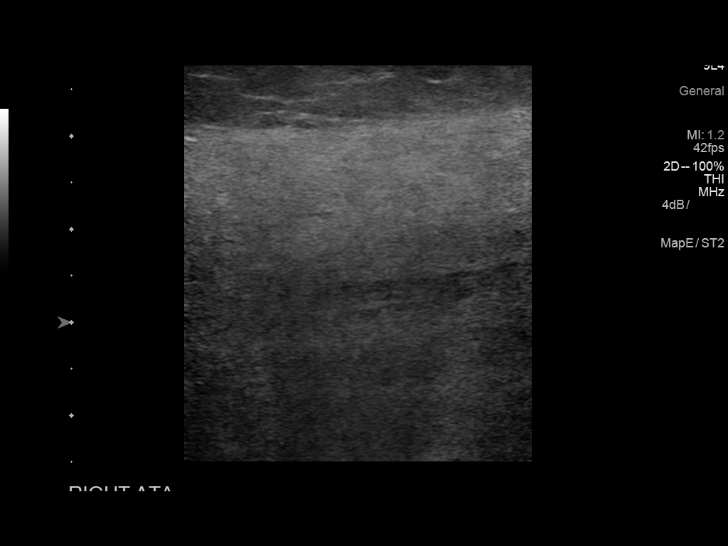
[im 40/79]
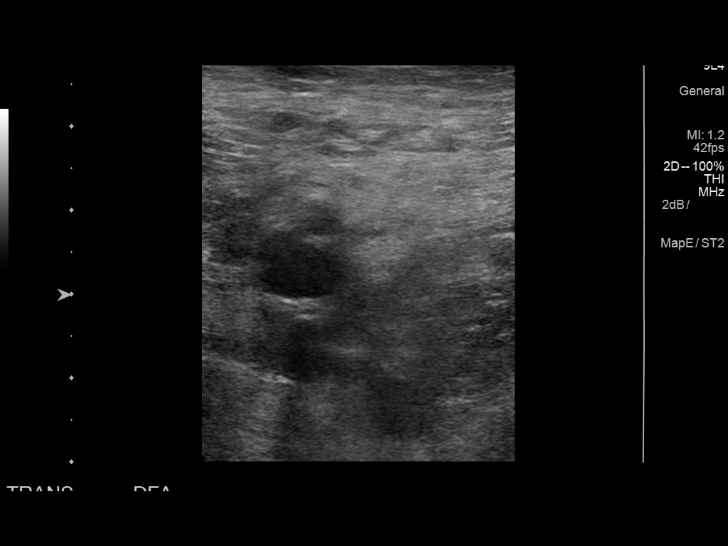
[im 46/79]
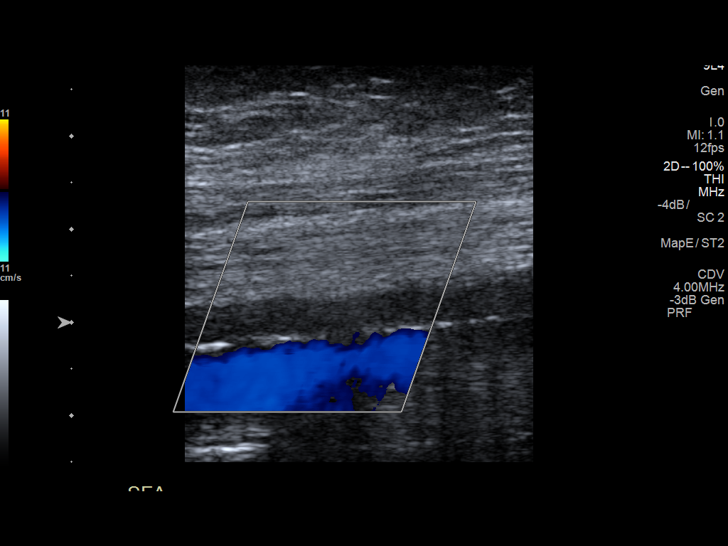
[im 53/79]
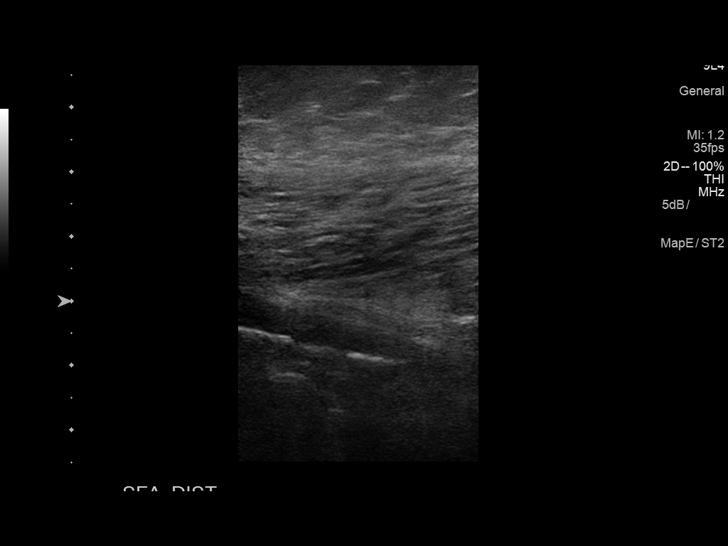
[im 59/79]
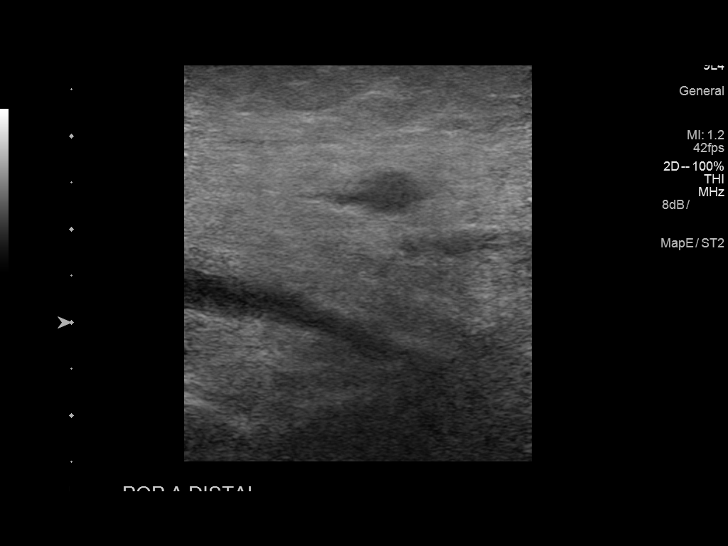
[im 66/79]
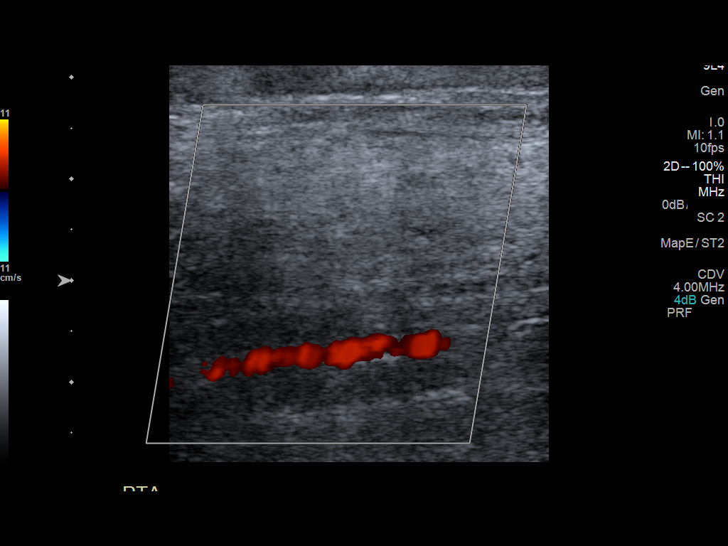
[im 72/79]
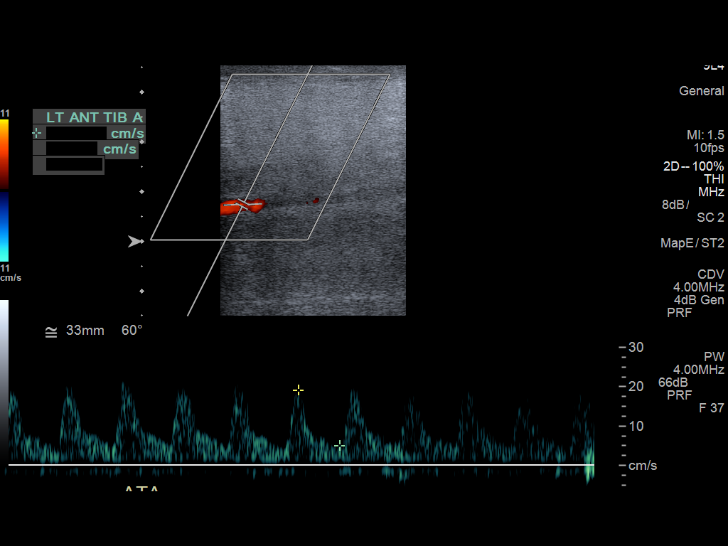
[im 79/79]
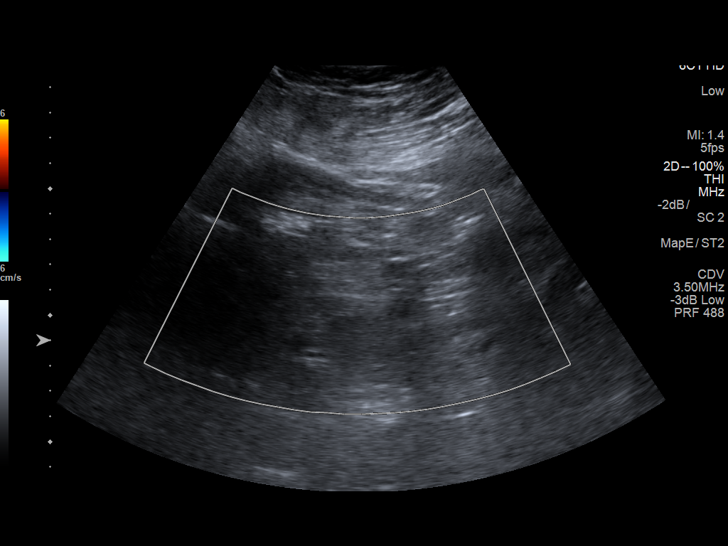

[13 of 25 positions shown; findings below may reference images not displayed]

FINDINGS: Right Lower Extremity

Inflow: Normal common femoral arterial waveforms and velocities.
Minimal plaque in the common femoral artery.

Outflow: Profunda femoral and superficial femoral artery waveforms
are triphasic. Velocities are normal. Mild plaque visualized in the
right SFA. The popliteal artery waveform is biphasic. Mild to
moderate plaque is identified in the popliteal artery.

Runoff: Anterior tibial and posterior tibial arteries demonstrate
biphasic waveforms.

Left Lower Extremity

Inflow: The visualized left common femoral artery appears likely
occluded.

Outflow: The left SFA is occluded at its origin with long segment
chronic occlusion into the distal thigh. The distal segment of the
SFA is open and demonstrates low velocity with monophasic waveform.
There is some flow detected in the profunda femoral artery. The
popliteal artery demonstrates monophasic waveform. Anterior tibial
and posterior tibial arteries demonstrate low velocities and
monophasic waveforms.

Runoff: Normal posterior and anterior tibial arterial waveforms and
velocities. Vessels are patent to the ankle.
IMPRESSION: 1. Occlusion of the left common femoral artery and SFA with
reconstitution at the level of the distal SFA. There does appear to
be some flow in the profunda femoral artery and the common femoral
artery may not be completely occluded. Correlation with CT
angiography of the aorta with bilateral iliofemoral runoff may be
helpful in determining exact nature of arterial occlusive disease
and whether there may be an appropriate target for distal bypass if
there remains problems with wound healing.
2. Plaque in the right common femoral, superficial femoral and
popliteal arteries without evidence of significant arterial
occlusive disease in the right lower extremity.

## 2020-02-25 ENCOUNTER — Other Ambulatory Visit: Payer: Self-pay

## 2020-02-25 ENCOUNTER — Ambulatory Visit (INDEPENDENT_AMBULATORY_CARE_PROVIDER_SITE_OTHER): Payer: Medicare HMO | Admitting: Vascular Surgery

## 2020-02-25 ENCOUNTER — Encounter (INDEPENDENT_AMBULATORY_CARE_PROVIDER_SITE_OTHER): Payer: Self-pay

## 2020-02-25 ENCOUNTER — Encounter (INDEPENDENT_AMBULATORY_CARE_PROVIDER_SITE_OTHER): Payer: Self-pay | Admitting: Vascular Surgery

## 2020-02-25 ENCOUNTER — Ambulatory Visit (INDEPENDENT_AMBULATORY_CARE_PROVIDER_SITE_OTHER): Payer: Medicare HMO

## 2020-02-25 VITALS — BP 103/69 | HR 88 | Resp 16

## 2020-02-25 DIAGNOSIS — N183 Chronic kidney disease, stage 3 unspecified: Secondary | ICD-10-CM

## 2020-02-25 DIAGNOSIS — E1122 Type 2 diabetes mellitus with diabetic chronic kidney disease: Secondary | ICD-10-CM | POA: Diagnosis not present

## 2020-02-25 DIAGNOSIS — Z89512 Acquired absence of left leg below knee: Secondary | ICD-10-CM

## 2020-02-25 DIAGNOSIS — I739 Peripheral vascular disease, unspecified: Secondary | ICD-10-CM | POA: Diagnosis not present

## 2020-02-25 DIAGNOSIS — I998 Other disorder of circulatory system: Secondary | ICD-10-CM

## 2020-02-25 DIAGNOSIS — Z794 Long term (current) use of insulin: Secondary | ICD-10-CM

## 2020-02-25 NOTE — Assessment & Plan Note (Signed)
Her right ABI today is 0.67 which is stable and unchanged from her previous study 6 months ago.  No role for intervention at this time.  No limb threatening symptoms.  Plan to return in follow-up in 6 months with ABIs.

## 2020-02-25 NOTE — Assessment & Plan Note (Signed)
Overall doing well.  Did apply some silver nitrate today.  Can use her prosthesis.

## 2020-02-25 NOTE — Progress Notes (Signed)
MRN : [id number]  [name] is a 59 y.o. (October 26, 1961) female who presents with chief complaint of  Chief Complaint  Patient presents with  . Follow-up    83month ultrasound follow up  .  History of Present Illness: Patient returns today in follow up of their PAD and the left below-knee amputation.  She still has a scab with some hypertrophic granulation tissue on the medial aspect of her left BKA.  We actually applied some silver nitrate to this today.  It is otherwise doing fine.  She does have her prosthesis but has been unable to get physical therapy until now.  As such, she has not yet walked on her prosthesis.  Her right ABI today is 0.67 which is stable and unchanged from her previous study 6 months ago.  No rest pain or ulceration on the right foot.  Current Outpatient Medications  Medication Sig Dispense Refill  . albuterol (PROAIR HFA) 108 (90 Base) MCG/ACT inhaler Inhale 2 puffs into the lungs every 6 (six) hours as needed (wheezing/shortness of breath).     Marland Kitchen atorvastatin (LIPITOR) 20 MG tablet Take 20 mg by mouth at bedtime.  12  . clonazepam (KLONOPIN) 0.25 MG disintegrating tablet Take 1 tablet (0.25 mg total) by mouth 2 (two) times daily as needed (Anxiety). 60 tablet 5  . clopidogrel (PLAVIX) 75 MG tablet Take 75 mg by mouth at bedtime.    . furosemide (LASIX) 20 MG tablet Take 1 tablet (20 mg total) by mouth daily. 30 tablet 0  . gabapentin (NEURONTIN) 600 MG tablet Take 600 mg by mouth 3 (three) times daily.    . insulin aspart (NOVOLOG) 100 UNIT/ML injection Inject 4 Units into the skin 3 (three) times daily with meals. (Patient taking differently: Inject 15 Units into the skin 3 (three) times daily with meals. ) 10 mL 11  . insulin glargine (LANTUS) 100 UNIT/ML injection Inject 0.35 mLs (35 Units total) into the skin daily. (Patient taking differently: Inject 35 Units into the skin at bedtime. ) 10 mL 11  . ipratropium-albuterol (DUONEB) 0.5-2.5 (3) MG/3ML SOLN Take 3  mLs by nebulization every 4 (four) hours as needed. 360 mL 0  . latanoprost (XALATAN) 0.005 % ophthalmic solution Place 1 drop into both eyes at bedtime. 2.5 mL 0  . metFORMIN (GLUCOPHAGE-XR) 500 MG 24 hr tablet Take 1 tablet by mouth 2 (two) times daily.  2  . metoprolol tartrate (LOPRESSOR) 25 MG tablet Take 1 tablet (25 mg total) by mouth 2 (two) times daily. 60 tablet 0  . nortriptyline (PAMELOR) 50 MG capsule Take 1 capsule (50 mg total) by mouth at bedtime. 90 capsule 0  . oxyCODONE-acetaminophen (PERCOCET) 10-325 MG tablet Take 1 tablet by mouth 3 (three) times daily. 30 tablet 0  . pantoprazole (PROTONIX) 20 MG tablet Take 20 mg by mouth daily before breakfast.    . cephALEXin (KEFLEX) 500 MG capsule Take 1 capsule (500 mg total) by mouth 2 (two) times daily. (Patient not taking: Reported on 10/08/2019) 20 capsule 0   No current facility-administered medications for this visit.    Past Medical History:  Diagnosis Date  . Acute pain of right shoulder 05/19/2016  . Acute PN (pyelonephritis) 05/18/2016  . Acute pyelonephritis 05/18/2016  . Anxiety   . Arthritis   . Asthma   . Diabetes mellitus without complication (HCC)   . GERD (gastroesophageal reflux disease)   . Glaucoma   . Hyperlipemia   . Hyperlipemia   .  Hypertension   . Peripheral vascular disease [facility])     Past Surgical History:  Procedure Laterality Date  . AMPUTATION Left 01/07/2019   Procedure: AMPUTATION BELOW KNEE;  Surgeon: Annice Needy, MD;  Location: [facility];  Service: Vascular;  Laterality: Left;  . ANTERIOR CRUCIATE LIGAMENT REPAIR    . APPLICATION OF WOUND VAC Left 01/14/2019   Procedure: APPLICATION OF WOUND VAC;  Surgeon: Annice Needy, MD;  Location: [facility];  Service: Vascular;  Laterality: Left;  Wound vac [id number]   . APPLICATION OF WOUND VAC Left 02/21/2019   Procedure: APPLICATION OF WOUND VAC;  Surgeon: Annice Needy, MD;  Location: [facility];  Service: Vascular;  Laterality: Left;  . FASCIOTOMY  Left 01/01/2019   Procedure: FASCIOTOMY LEFT LEG;  Surgeon: Annice Needy, MD;  Location: [facility];  Service: Vascular;  Laterality: Left;  . I & D EXTREMITY Left 01/14/2019   Procedure: IRRIGATION AND DEBRIDEMENT EXTREMITY-LEFT BKA;  Surgeon: Annice Needy, MD;  Location: [facility];  Service: Vascular;  Laterality: Left;  . LOWER EXTREMITY ANGIOGRAPHY Left 12/17/2018   Procedure: LOWER EXTREMITY ANGIOGRAPHY;  Surgeon: Annice Needy, MD;  Location: [facility];  Service: Cardiovascular;  Laterality: Left;  . LOWER EXTREMITY ANGIOGRAPHY Left 12/31/2018   Procedure: Lower Extremity Angiography;  Surgeon: Annice Needy, MD;  Location: [facility];  Service: Cardiovascular;  Laterality: Left;  . LOWER EXTREMITY ANGIOGRAPHY Left 01/01/2019   Procedure: Lower Extremity Angiography;  Surgeon: Annice Needy, MD;  Location: [facility];  Service: Cardiovascular;  Laterality: Left;  . WOUND DEBRIDEMENT Left 02/21/2019   Procedure: DEBRIDEMENT WOUND;  Surgeon: Annice Needy, MD;  Location: [facility];  Service: Vascular;  Laterality: Left;     Social History   Tobacco Use  . Smoking status: Former Smoker    Packs/day: 0.50    Start date: 01/18/2019  . Smokeless tobacco: Never Used  Substance Use Topics  . Alcohol use: No  . Drug use: Never    Family History  Problem Relation Age of Onset  . Diabetes Mother   . Heart disease Mother   . Stroke Mother   . Kidney cancer Neg Hx   . Prostate cancer Neg Hx      Allergies  Allergen Reactions  . Biaxin [Clarithromycin] Rash    Patient states this medication gives her severe rash and thrush in the mouth.   REVIEW OF SYSTEMS(Negative unless checked)  Constitutional: [] ???Weight loss [] ???Fever [] ???Chills Cardiac: [] ???Chest pain [] ???Chest pressure [] ???Palpitations [] ???Shortness of breath when laying flat [] ???Shortness of breath at rest [] ???Shortness of breath with exertion. Vascular: [] ???Pain in legs with  walking [] ???Pain in legs at rest [] ???Pain in legs when laying flat [] ???Claudication [] ???Pain in feet when walking [] ???Pain in feet at rest [] ???Pain in feet when laying flat [] ???History of DVT [] ???Phlebitis [] ???Swelling in legs [] ???Varicose veins [x] ???Non-healing ulcers Pulmonary: [] ???Uses home oxygen [] ???Productive cough [] ???Hemoptysis [] ???Wheeze [] ???COPD [] ???Asthma Neurologic: [] ???Dizziness [] ???Blackouts [] ???Seizures [] ???History of stroke [] ???History of TIA [] ???Aphasia [] ???Temporary blindness [] ???Dysphagia [] ???Weakness or numbness in arms [] ???Weakness or numbness in legs Musculoskeletal: [x] ???Arthritis [] ???Joint swelling [] ???Joint pain [] ???Low back pain Hematologic: [] ???Easy bruising [] ???Easy bleeding [] ???Hypercoagulable state [] ???Anemic  Gastrointestinal: [] ???Blood in stool [] ???Vomiting blood [] ???Gastroesophageal reflux/heartburn [] ???Abdominal pain Genitourinary: [] ???Chronic kidney disease [] ???Difficult urination [] ???Frequent urination [] ???Burning with urination [] ???Hematuria Skin: [] ???Rashes [x] ???Ulcers [x] ???Wounds Psychological: [] ???History of anxiety [] ???History of major depression.   Physical Examination  BP 103/69 (BP Location: Left Arm)   Pulse 88  Resp 16  Gen:  WD/WN, NAD Head: [place]/AT, No temporalis wasting. Ear/Nose/Throat: Hearing grossly intact, nares w/o erythema or drainage Eyes: Conjunctiva clear. Sclera non-icteric Neck: Supple.  Trachea midline Pulmonary:  Good air movement, no use of accessory muscles.  Cardiac: RRR, no JVD Vascular:  Vessel Right Left  Radial Palpable Palpable                          PT  trace palpable  not palpable  DP  1+ palpable  not palpable   Gastrointestinal: soft, non-tender/non-distended. No guarding/reflex.  Musculoskeletal: M/S 5/5 throughout.  In a wheelchair.  Dime sized area of hypertrophic  granulation tissue on the medial aspect of the wound treated with silver nitrate today.  The wound is otherwise intact. Neurologic: Sensation grossly intact in extremities.  Symmetrical.  Speech is fluent.  Psychiatric: Judgment intact, Mood & affect appropriate for pt's clinical situation. Dermatologic: No rashes or ulcers noted.  No cellulitis or open wounds.       Labs No results found for this or any previous visit (from the past 2160 hour(s)).  Radiology No results found.  Assessment/Plan Diabetes mellitus ([place]) blood glucose control important in reducing the progression of atherosclerotic disease. Also, involved in wound healing. On appropriate medications.  Hx of BKA, left ([place]) Overall doing well.  Did apply some silver nitrate today.  Can use her prosthesis.  PAD (peripheral artery disease) (HCC) Her right ABI today is 0.67 which is stable and unchanged from her previous study 6 months ago.  No role for intervention at this time.  No limb threatening symptoms.  Plan to return in follow-up in 6 months with ABIs.    Leotis Pain, MD  02/25/2020 12:13 PM    This note was created with Dragon medical transcription system.  Any errors from dictation are purely unintentional

## 2020-03-26 DEATH — deceased

## 2020-05-27 IMAGING — CR DG FOOT COMPLETE 3+V*L*
1 series · 3 of 3 positions shown · non-contrast
Comparison: None

CLINICAL DATA: Nonhealing wound at LEFT heel for 3 weeks, diabetes
mellitus

EXAM:
LEFT FOOT - COMPLETE 3+ VIEW

[Series 1: dg foot complete left · 0.14mm/px · 3 of 3 slices shown]
[im 1/3]
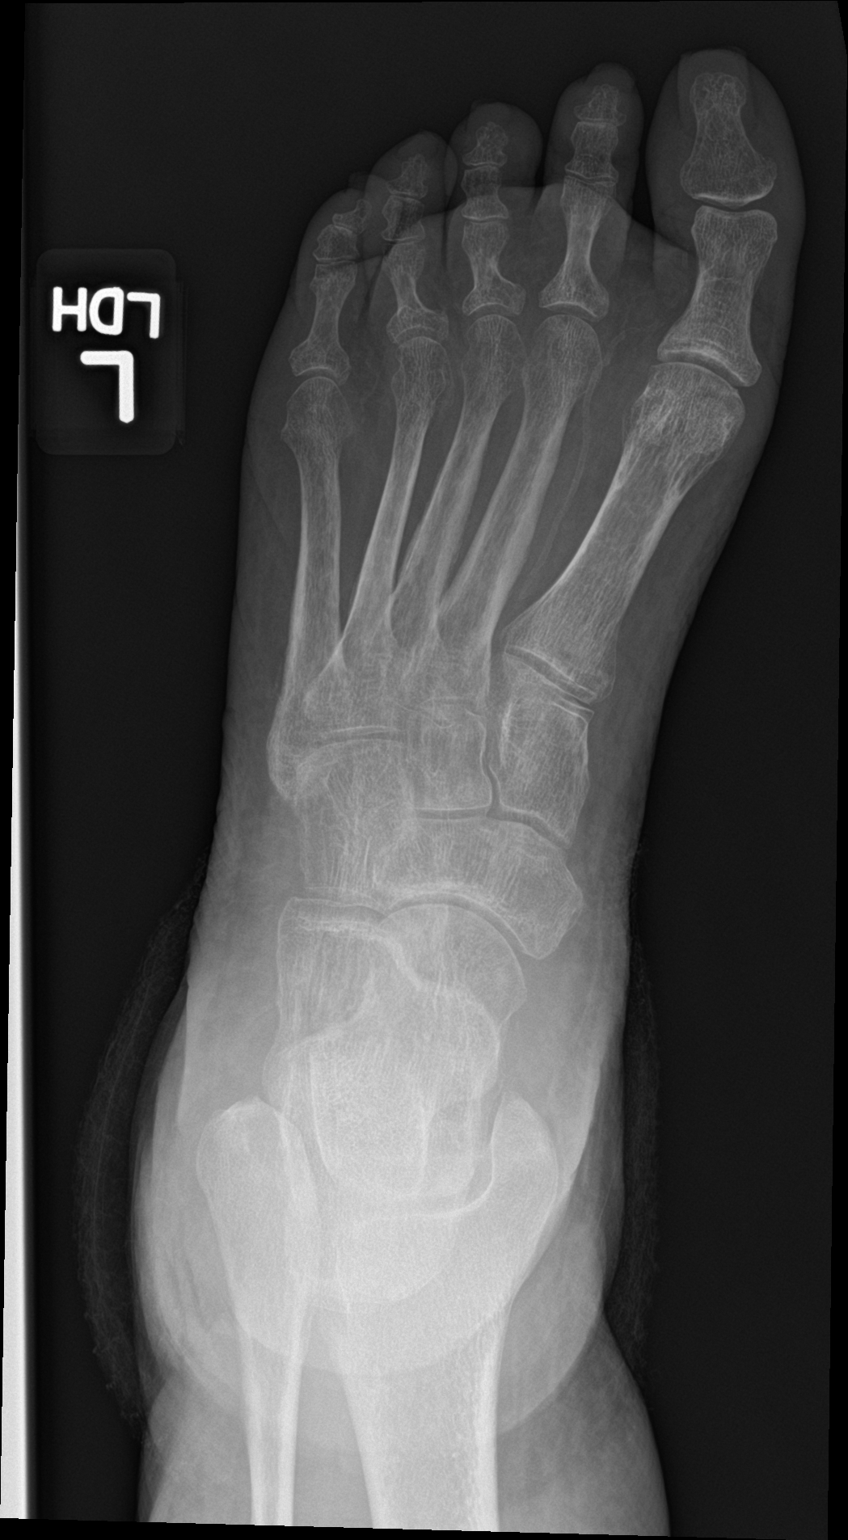
[im 2/3]
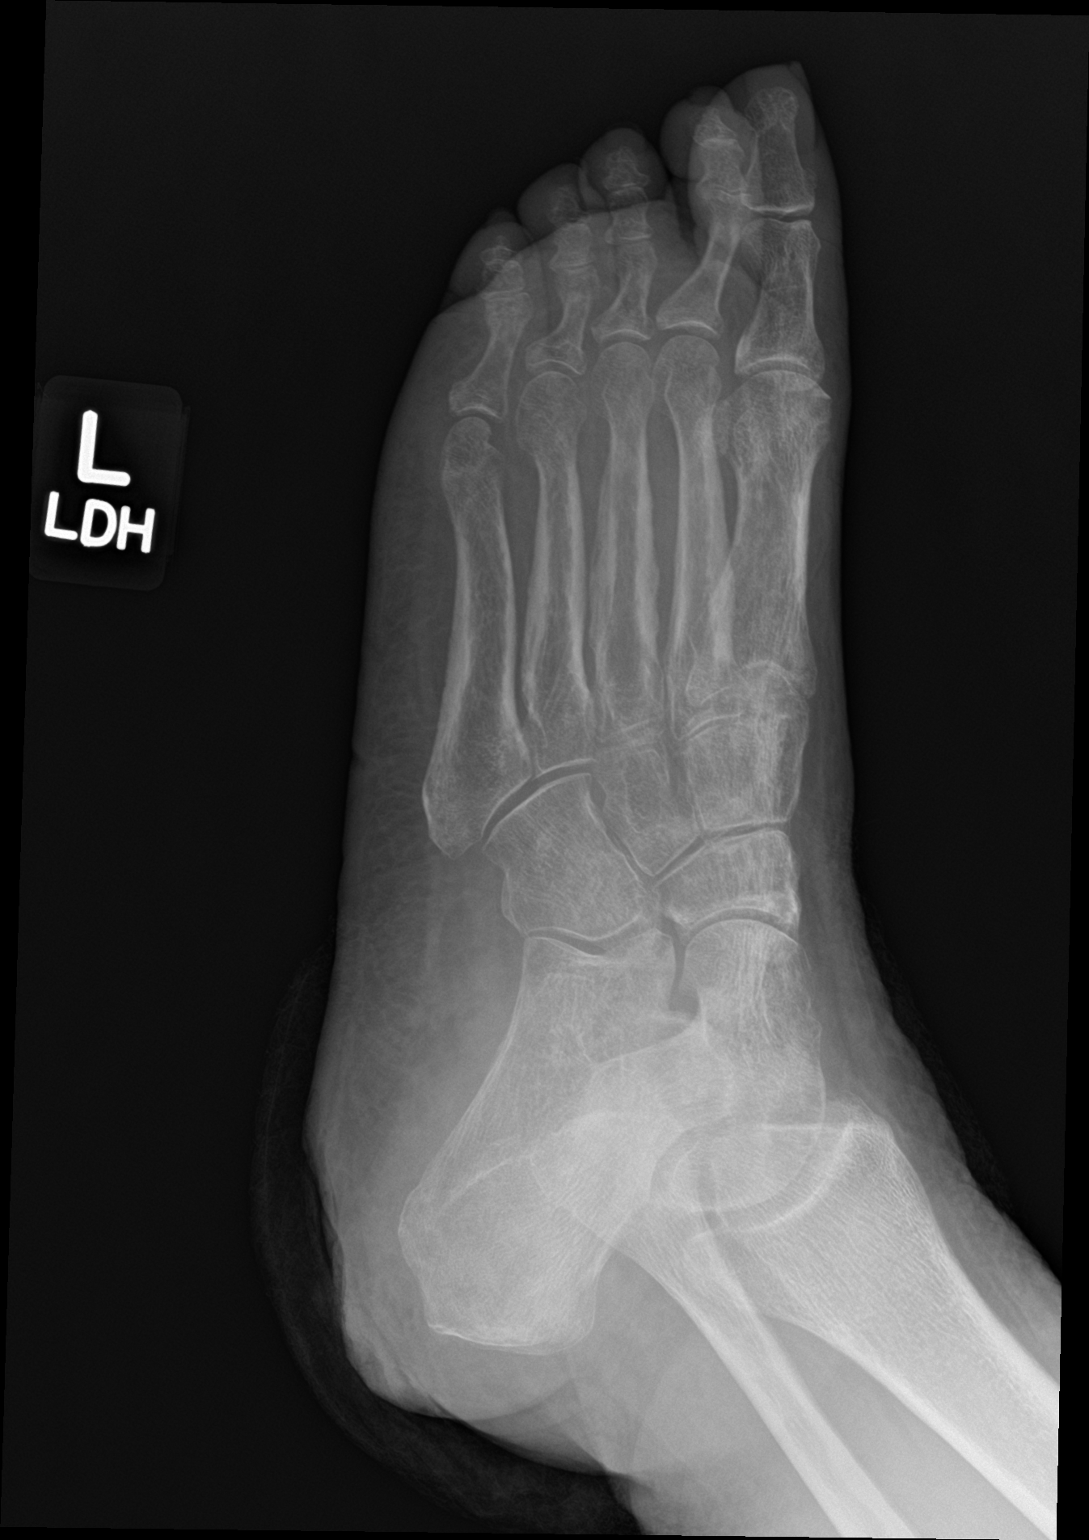
[im 3/3]
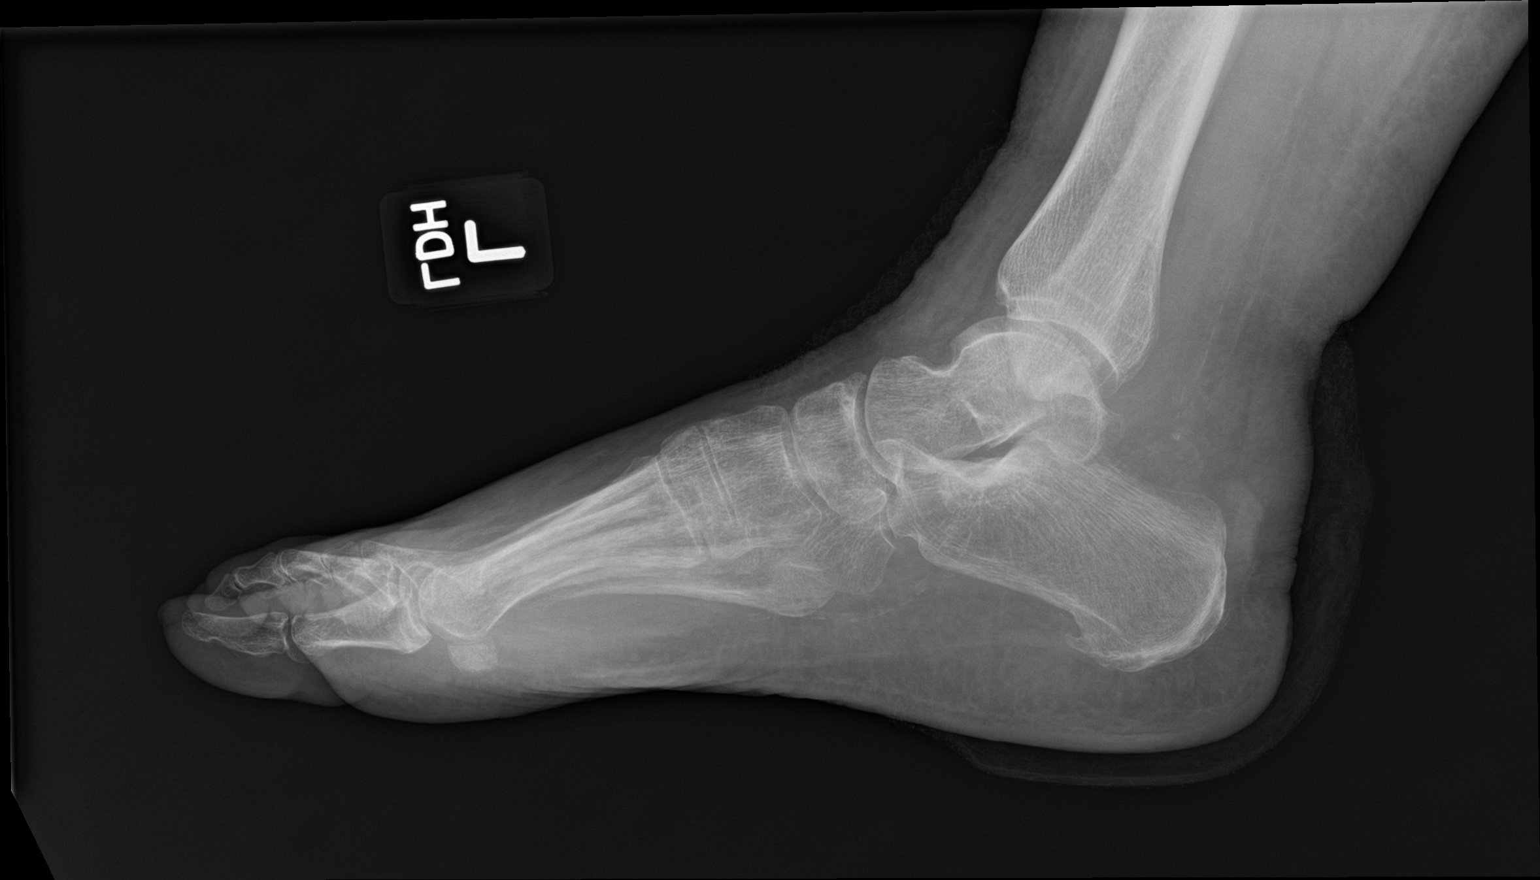

[3 of 3 positions shown; findings below may reference images not displayed]

FINDINGS: Osseous demineralization.

Joint spaces preserved.

No acute fracture, dislocation, or bone destruction.

Dressing artifacts overlie heel and hindfoot.

Small vessel vascular calcifications consistent with history of
diabetes mellitus.
IMPRESSION: Cheol Jun mineralization without acute bony abnormalities.

## 2020-08-28 ENCOUNTER — Ambulatory Visit (INDEPENDENT_AMBULATORY_CARE_PROVIDER_SITE_OTHER): Payer: Medicare HMO | Admitting: Vascular Surgery

## 2020-08-28 ENCOUNTER — Encounter (INDEPENDENT_AMBULATORY_CARE_PROVIDER_SITE_OTHER): Payer: Medicare HMO
